# Patient Record
Sex: Female | Born: 1950 | Race: White | Hispanic: No | Marital: Married | Smoking: Former smoker
Health system: Southern US, Community
[De-identification: ages and names within clinical notes are randomized; demographics above are authoritative.]

## PROBLEM LIST (undated history)

## (undated) ENCOUNTER — Emergency Department (HOSPITAL_COMMUNITY): Payer: Medicare Other | Source: Home / Self Care

## (undated) DIAGNOSIS — Z9989 Dependence on other enabling machines and devices: Secondary | ICD-10-CM

## (undated) DIAGNOSIS — R0602 Shortness of breath: Secondary | ICD-10-CM

## (undated) DIAGNOSIS — E785 Hyperlipidemia, unspecified: Secondary | ICD-10-CM

## (undated) DIAGNOSIS — F419 Anxiety disorder, unspecified: Secondary | ICD-10-CM

## (undated) DIAGNOSIS — B029 Zoster without complications: Secondary | ICD-10-CM

## (undated) DIAGNOSIS — Z96649 Presence of unspecified artificial hip joint: Secondary | ICD-10-CM

## (undated) DIAGNOSIS — D649 Anemia, unspecified: Secondary | ICD-10-CM

## (undated) DIAGNOSIS — Z9289 Personal history of other medical treatment: Secondary | ICD-10-CM

## (undated) DIAGNOSIS — M009 Pyogenic arthritis, unspecified: Secondary | ICD-10-CM

## (undated) DIAGNOSIS — H269 Unspecified cataract: Secondary | ICD-10-CM

## (undated) DIAGNOSIS — F418 Other specified anxiety disorders: Secondary | ICD-10-CM

## (undated) DIAGNOSIS — G2581 Restless legs syndrome: Secondary | ICD-10-CM

## (undated) DIAGNOSIS — I700 Atherosclerosis of aorta: Secondary | ICD-10-CM

## (undated) DIAGNOSIS — J189 Pneumonia, unspecified organism: Secondary | ICD-10-CM

## (undated) DIAGNOSIS — I10 Essential (primary) hypertension: Secondary | ICD-10-CM

## (undated) DIAGNOSIS — J432 Centrilobular emphysema: Secondary | ICD-10-CM

## (undated) DIAGNOSIS — E039 Hypothyroidism, unspecified: Secondary | ICD-10-CM

## (undated) DIAGNOSIS — G4733 Obstructive sleep apnea (adult) (pediatric): Secondary | ICD-10-CM

## (undated) DIAGNOSIS — G5600 Carpal tunnel syndrome, unspecified upper limb: Secondary | ICD-10-CM

## (undated) DIAGNOSIS — I2510 Atherosclerotic heart disease of native coronary artery without angina pectoris: Secondary | ICD-10-CM

## (undated) DIAGNOSIS — Z8614 Personal history of Methicillin resistant Staphylococcus aureus infection: Secondary | ICD-10-CM

## (undated) DIAGNOSIS — K5903 Drug induced constipation: Secondary | ICD-10-CM

## (undated) DIAGNOSIS — M1990 Unspecified osteoarthritis, unspecified site: Secondary | ICD-10-CM

## (undated) DIAGNOSIS — K219 Gastro-esophageal reflux disease without esophagitis: Secondary | ICD-10-CM

## (undated) DIAGNOSIS — F319 Bipolar disorder, unspecified: Secondary | ICD-10-CM

## (undated) HISTORY — DX: Obstructive sleep apnea (adult) (pediatric): G47.33

## (undated) HISTORY — PX: NOSE SURGERY: SHX723

## (undated) HISTORY — PX: EYE SURGERY: SHX253

## (undated) HISTORY — DX: Pyogenic arthritis, unspecified: M00.9

## (undated) HISTORY — DX: Other specified anxiety disorders: F41.8

## (undated) HISTORY — DX: Presence of unspecified artificial hip joint: Z96.649

## (undated) HISTORY — DX: Bipolar disorder, unspecified: F31.9

## (undated) HISTORY — DX: Atherosclerosis of aorta: I70.0

## (undated) HISTORY — DX: Atherosclerotic heart disease of native coronary artery without angina pectoris: I25.10

## (undated) HISTORY — DX: Centrilobular emphysema: J43.2

## (undated) HISTORY — DX: Zoster without complications: B02.9

## (undated) HISTORY — DX: Personal history of Methicillin resistant Staphylococcus aureus infection: Z86.14

## (undated) HISTORY — PX: NECK SURGERY: SHX720

## (undated) HISTORY — DX: Unspecified cataract: H26.9

## (undated) HISTORY — DX: Anemia, unspecified: D64.9

## (undated) HISTORY — PX: BREAST CYST ASPIRATION: SHX578

## (undated) HISTORY — DX: Unspecified osteoarthritis, unspecified site: M19.90

## (undated) HISTORY — DX: Gastro-esophageal reflux disease without esophagitis: K21.9

---

## 1980-09-29 HISTORY — PX: TMJ ARTHROPLASTY: SHX1066

## 1980-09-29 HISTORY — PX: FOOT SURGERY: SHX648

## 1982-09-29 HISTORY — PX: PARTIAL HYSTERECTOMY: SHX80

## 2000-09-29 HISTORY — PX: TOTAL HIP ARTHROPLASTY: SHX124

## 2001-12-25 ENCOUNTER — Emergency Department (HOSPITAL_COMMUNITY): Admission: EM | Admit: 2001-12-25 | Discharge: 2001-12-25 | Payer: Self-pay | Admitting: *Deleted

## 2003-06-27 ENCOUNTER — Encounter: Payer: Self-pay | Admitting: Family Medicine

## 2006-01-22 ENCOUNTER — Encounter: Payer: Self-pay | Admitting: Family Medicine

## 2006-02-14 ENCOUNTER — Encounter: Payer: Self-pay | Admitting: Family Medicine

## 2006-11-10 ENCOUNTER — Encounter: Payer: Self-pay | Admitting: Family Medicine

## 2007-07-30 ENCOUNTER — Ambulatory Visit: Payer: Self-pay | Admitting: Family Medicine

## 2007-07-30 DIAGNOSIS — M1990 Unspecified osteoarthritis, unspecified site: Secondary | ICD-10-CM | POA: Insufficient documentation

## 2007-07-30 DIAGNOSIS — F3181 Bipolar II disorder: Secondary | ICD-10-CM | POA: Insufficient documentation

## 2007-07-30 DIAGNOSIS — K219 Gastro-esophageal reflux disease without esophagitis: Secondary | ICD-10-CM | POA: Insufficient documentation

## 2007-07-30 DIAGNOSIS — J309 Allergic rhinitis, unspecified: Secondary | ICD-10-CM | POA: Insufficient documentation

## 2007-07-30 DIAGNOSIS — J45909 Unspecified asthma, uncomplicated: Secondary | ICD-10-CM | POA: Insufficient documentation

## 2007-07-30 DIAGNOSIS — G4733 Obstructive sleep apnea (adult) (pediatric): Secondary | ICD-10-CM | POA: Insufficient documentation

## 2007-07-30 DIAGNOSIS — E1142 Type 2 diabetes mellitus with diabetic polyneuropathy: Secondary | ICD-10-CM | POA: Insufficient documentation

## 2007-07-30 DIAGNOSIS — Z9989 Dependence on other enabling machines and devices: Secondary | ICD-10-CM

## 2007-07-30 DIAGNOSIS — D649 Anemia, unspecified: Secondary | ICD-10-CM | POA: Insufficient documentation

## 2007-07-30 DIAGNOSIS — F319 Bipolar disorder, unspecified: Secondary | ICD-10-CM | POA: Insufficient documentation

## 2007-07-30 DIAGNOSIS — E039 Hypothyroidism, unspecified: Secondary | ICD-10-CM | POA: Insufficient documentation

## 2007-08-02 ENCOUNTER — Telehealth: Payer: Self-pay | Admitting: Family Medicine

## 2007-08-04 ENCOUNTER — Ambulatory Visit: Payer: Self-pay | Admitting: Family Medicine

## 2007-08-06 LAB — CONVERTED CEMR LAB
ALT: 33 units/L (ref 0–35)
AST: 21 units/L (ref 0–37)
Albumin: 3.9 g/dL (ref 3.5–5.2)
Alkaline Phosphatase: 83 units/L (ref 39–117)
BUN: 12 mg/dL (ref 6–23)
Bilirubin, Direct: 0.1 mg/dL (ref 0.0–0.3)
CO2: 30 meq/L (ref 19–32)
Calcium: 9.5 mg/dL (ref 8.4–10.5)
Chloride: 106 meq/L (ref 96–112)
Cholesterol: 171 mg/dL (ref 0–200)
Creatinine, Ser: 0.7 mg/dL (ref 0.4–1.2)
Creatinine,U: 210.7 mg/dL
GFR calc Af Amer: 112 mL/min
GFR calc non Af Amer: 92 mL/min
Glucose, Bld: 151 mg/dL — ABNORMAL HIGH (ref 70–99)
HDL: 33 mg/dL — ABNORMAL LOW (ref >39.0)
Hgb A1c MFr Bld: 8.1 % — ABNORMAL HIGH (ref 4.6–6.0)
LDL Cholesterol: 116 mg/dL — ABNORMAL HIGH (ref 0–99)
Microalb Creat Ratio: 3.3 mg/g (ref 0.0–30.0)
Microalb, Ur: 0.7 mg/dL (ref 0.0–1.9)
Potassium: 4.4 meq/L (ref 3.5–5.1)
Sodium: 143 meq/L (ref 135–145)
Total Bilirubin: 0.6 mg/dL (ref 0.3–1.2)
Total CHOL/HDL Ratio: 5.2
Total Protein: 6.5 g/dL (ref 6.0–8.3)
Triglycerides: 111 mg/dL (ref 0–149)
VLDL: 22 mg/dL (ref 0–40)

## 2007-08-16 ENCOUNTER — Ambulatory Visit: Payer: Self-pay | Admitting: Family Medicine

## 2007-08-16 DIAGNOSIS — E1169 Type 2 diabetes mellitus with other specified complication: Secondary | ICD-10-CM | POA: Insufficient documentation

## 2007-08-16 DIAGNOSIS — E7800 Pure hypercholesterolemia, unspecified: Secondary | ICD-10-CM

## 2007-08-20 ENCOUNTER — Telehealth: Payer: Self-pay | Admitting: Family Medicine

## 2007-09-10 ENCOUNTER — Ambulatory Visit: Payer: Self-pay | Admitting: Family Medicine

## 2007-09-15 ENCOUNTER — Ambulatory Visit: Payer: Self-pay | Admitting: Family Medicine

## 2007-10-21 ENCOUNTER — Ambulatory Visit: Payer: Self-pay | Admitting: Family Medicine

## 2007-12-06 ENCOUNTER — Encounter: Payer: Self-pay | Admitting: Family Medicine

## 2007-12-08 ENCOUNTER — Encounter: Payer: Self-pay | Admitting: Family Medicine

## 2007-12-10 ENCOUNTER — Ambulatory Visit: Payer: Self-pay | Admitting: Family Medicine

## 2007-12-13 LAB — CONVERTED CEMR LAB
BUN: 12 mg/dL (ref 6–23)
CO2: 28 meq/L (ref 19–32)
Calcium: 9.4 mg/dL (ref 8.4–10.5)
Chloride: 110 meq/L (ref 96–112)
Cholesterol: 118 mg/dL (ref 0–200)
Creatinine, Ser: 0.6 mg/dL (ref 0.4–1.2)
GFR calc Af Amer: 133 mL/min
GFR calc non Af Amer: 110 mL/min
Glucose, Bld: 108 mg/dL — ABNORMAL HIGH (ref 70–99)
HDL: 38.9 mg/dL — ABNORMAL LOW (ref >39.0)
Hgb A1c MFr Bld: 6.4 % — ABNORMAL HIGH (ref 4.6–6.0)
LDL Cholesterol: 65 mg/dL (ref 0–99)
Potassium: 4.3 meq/L (ref 3.5–5.1)
Sodium: 143 meq/L (ref 135–145)
Total CHOL/HDL Ratio: 3
Triglycerides: 71 mg/dL (ref 0–149)
VLDL: 14 mg/dL (ref 0–40)

## 2007-12-14 ENCOUNTER — Telehealth: Payer: Self-pay | Admitting: Family Medicine

## 2007-12-14 ENCOUNTER — Ambulatory Visit: Payer: Self-pay | Admitting: Family Medicine

## 2007-12-27 ENCOUNTER — Encounter: Payer: Self-pay | Admitting: Family Medicine

## 2008-01-11 ENCOUNTER — Encounter: Payer: Self-pay | Admitting: Family Medicine

## 2008-01-25 ENCOUNTER — Ambulatory Visit: Payer: Self-pay | Admitting: Family Medicine

## 2008-02-03 ENCOUNTER — Encounter: Payer: Self-pay | Admitting: Family Medicine

## 2008-03-14 ENCOUNTER — Ambulatory Visit: Payer: Self-pay | Admitting: Family Medicine

## 2008-03-15 LAB — CONVERTED CEMR LAB: Hgb A1c MFr Bld: 6.2 % — ABNORMAL HIGH (ref 4.6–6.0)

## 2008-03-16 ENCOUNTER — Ambulatory Visit: Payer: Self-pay | Admitting: Family Medicine

## 2008-03-21 ENCOUNTER — Ambulatory Visit: Payer: Self-pay | Admitting: Gastroenterology

## 2008-03-21 ENCOUNTER — Encounter: Payer: Self-pay | Admitting: Family Medicine

## 2008-03-21 LAB — HM COLONOSCOPY

## 2008-03-27 ENCOUNTER — Telehealth: Payer: Self-pay | Admitting: Family Medicine

## 2008-04-14 ENCOUNTER — Telehealth: Payer: Self-pay | Admitting: Family Medicine

## 2008-05-23 ENCOUNTER — Telehealth: Payer: Self-pay | Admitting: Family Medicine

## 2008-06-15 ENCOUNTER — Ambulatory Visit: Payer: Self-pay | Admitting: Family Medicine

## 2008-06-15 LAB — CONVERTED CEMR LAB
Creatinine,U: 32.3 mg/dL
Free T4: 0.9 ng/dL (ref 0.6–1.6)
Hgb A1c MFr Bld: 6.2 % — ABNORMAL HIGH (ref 4.6–6.0)
Microalb Creat Ratio: 6.2 mg/g (ref 0.0–30.0)
Microalb, Ur: 0.2 mg/dL (ref 0.0–1.9)
T3, Free: 2.6 pg/mL (ref 2.3–4.2)
TSH: 0.12 u[IU]/mL — ABNORMAL LOW (ref 0.35–5.50)

## 2008-06-20 ENCOUNTER — Ambulatory Visit: Payer: Self-pay | Admitting: Family Medicine

## 2008-06-22 ENCOUNTER — Encounter: Payer: Self-pay | Admitting: Family Medicine

## 2008-07-21 ENCOUNTER — Ambulatory Visit: Payer: Self-pay | Admitting: Family Medicine

## 2008-07-21 DIAGNOSIS — H8110 Benign paroxysmal vertigo, unspecified ear: Secondary | ICD-10-CM | POA: Insufficient documentation

## 2008-07-21 LAB — CONVERTED CEMR LAB
Free T4: 0.9 ng/dL (ref 0.6–1.6)
T3, Free: 2.5 pg/mL (ref 2.3–4.2)
TSH: 0.54 microintl units/mL (ref 0.35–5.50)

## 2008-09-19 ENCOUNTER — Ambulatory Visit: Payer: Self-pay | Admitting: Family Medicine

## 2008-09-27 ENCOUNTER — Ambulatory Visit: Payer: Self-pay | Admitting: Family Medicine

## 2008-09-27 LAB — CONVERTED CEMR LAB
ALT: 21 units/L (ref 0–35)
AST: 16 units/L (ref 0–37)
Albumin: 3.7 g/dL (ref 3.5–5.2)
Alkaline Phosphatase: 66 units/L (ref 39–117)
BUN: 11 mg/dL (ref 6–23)
Bilirubin, Direct: 0.1 mg/dL (ref 0.0–0.3)
CO2: 29 meq/L (ref 19–32)
Calcium: 9.3 mg/dL (ref 8.4–10.5)
Chloride: 111 meq/L (ref 96–112)
Cholesterol: 132 mg/dL (ref 0–200)
Creatinine, Ser: 0.7 mg/dL (ref 0.4–1.2)
GFR calc Af Amer: 111 mL/min
GFR calc non Af Amer: 92 mL/min
Glucose, Bld: 127 mg/dL — ABNORMAL HIGH (ref 70–99)
HDL: 48.6 mg/dL (ref >39.0)
Hgb A1c MFr Bld: 6.3 % — ABNORMAL HIGH (ref 4.6–6.0)
LDL Cholesterol: 70 mg/dL (ref 0–99)
Potassium: 4.6 meq/L (ref 3.5–5.1)
Sodium: 143 meq/L (ref 135–145)
Total Bilirubin: 0.7 mg/dL (ref 0.3–1.2)
Total CHOL/HDL Ratio: 2.7
Total Protein: 6.3 g/dL (ref 6.0–8.3)
Triglycerides: 65 mg/dL (ref 0–149)
VLDL: 13 mg/dL (ref 0–40)

## 2008-10-03 ENCOUNTER — Ambulatory Visit: Payer: Self-pay | Admitting: Family Medicine

## 2008-10-03 LAB — CONVERTED CEMR LAB
Cholesterol, target level: 200 mg/dL
HDL goal, serum: 40 mg/dL
LDL Goal: 100 mg/dL

## 2008-11-01 ENCOUNTER — Emergency Department: Payer: Self-pay | Admitting: Emergency Medicine

## 2008-11-02 ENCOUNTER — Telehealth: Payer: Self-pay | Admitting: Family Medicine

## 2008-11-09 ENCOUNTER — Ambulatory Visit: Payer: Self-pay | Admitting: Family Medicine

## 2008-11-09 DIAGNOSIS — J45901 Unspecified asthma with (acute) exacerbation: Secondary | ICD-10-CM | POA: Insufficient documentation

## 2008-12-14 ENCOUNTER — Ambulatory Visit: Payer: Self-pay | Admitting: Family Medicine

## 2009-01-01 ENCOUNTER — Ambulatory Visit: Payer: Self-pay | Admitting: Family Medicine

## 2009-01-01 LAB — CONVERTED CEMR LAB: Hgb A1c MFr Bld: 6.4 % (ref 4.6–6.5)

## 2009-01-02 ENCOUNTER — Telehealth: Payer: Self-pay | Admitting: Family Medicine

## 2009-01-02 ENCOUNTER — Ambulatory Visit: Payer: Self-pay | Admitting: Family Medicine

## 2009-01-02 ENCOUNTER — Encounter (INDEPENDENT_AMBULATORY_CARE_PROVIDER_SITE_OTHER): Payer: Self-pay | Admitting: *Deleted

## 2009-02-15 ENCOUNTER — Ambulatory Visit: Payer: Self-pay | Admitting: Family Medicine

## 2009-02-15 LAB — CONVERTED CEMR LAB
Bacteria, UA: 0
Bilirubin Urine: NEGATIVE
Glucose, Urine, Semiquant: NEGATIVE
Ketones, urine, test strip: NEGATIVE
Nitrite: NEGATIVE
Specific Gravity, Urine: <1.005
Urobilinogen, UA: 0.2
pH: 7.5

## 2009-02-16 ENCOUNTER — Encounter: Payer: Self-pay | Admitting: Family Medicine

## 2009-02-27 ENCOUNTER — Telehealth: Payer: Self-pay | Admitting: Family Medicine

## 2009-03-16 ENCOUNTER — Ambulatory Visit: Payer: Self-pay | Admitting: Family Medicine

## 2009-04-05 ENCOUNTER — Ambulatory Visit: Payer: Self-pay | Admitting: Family Medicine

## 2009-04-05 LAB — CONVERTED CEMR LAB
ALT: 29 units/L (ref 0–35)
AST: 23 units/L (ref 0–37)
Albumin: 3.8 g/dL (ref 3.5–5.2)
Alkaline Phosphatase: 70 units/L (ref 39–117)
BUN: 17 mg/dL (ref 6–23)
Bilirubin, Direct: 0.1 mg/dL (ref 0.0–0.3)
CO2: 27 meq/L (ref 19–32)
Calcium: 9.2 mg/dL (ref 8.4–10.5)
Chloride: 110 meq/L (ref 96–112)
Cholesterol: 137 mg/dL (ref 0–200)
Creatinine, Ser: 0.6 mg/dL (ref 0.4–1.2)
GFR calc non Af Amer: 109.59 mL/min (ref >60)
Glucose, Bld: 118 mg/dL — ABNORMAL HIGH (ref 70–99)
HDL: 50.3 mg/dL (ref >39.00)
Hgb A1c MFr Bld: 6.2 % (ref 4.6–6.5)
LDL Cholesterol: 77 mg/dL (ref 0–99)
Potassium: 4.1 meq/L (ref 3.5–5.1)
Sodium: 144 meq/L (ref 135–145)
Total Bilirubin: 0.6 mg/dL (ref 0.3–1.2)
Total CHOL/HDL Ratio: 3
Total Protein: 6.5 g/dL (ref 6.0–8.3)
Triglycerides: 51 mg/dL (ref 0.0–149.0)
VLDL: 10.2 mg/dL (ref 0.0–40.0)

## 2009-04-10 ENCOUNTER — Ambulatory Visit: Payer: Self-pay | Admitting: Family Medicine

## 2009-07-03 ENCOUNTER — Ambulatory Visit: Payer: Self-pay | Admitting: Family Medicine

## 2009-07-03 LAB — CONVERTED CEMR LAB
Creatinine,U: 64 mg/dL
Hgb A1c MFr Bld: 6.2 % (ref 4.6–6.5)
Microalb Creat Ratio: 3.1 mg/g (ref 0.0–30.0)
Microalb, Ur: 0.2 mg/dL (ref 0.0–1.9)
TSH: 0.6 microintl units/mL (ref 0.35–5.50)

## 2009-07-19 ENCOUNTER — Ambulatory Visit: Payer: Self-pay | Admitting: Internal Medicine

## 2009-07-26 ENCOUNTER — Ambulatory Visit: Payer: Self-pay | Admitting: Family Medicine

## 2009-08-09 ENCOUNTER — Ambulatory Visit: Payer: Self-pay | Admitting: Family Medicine

## 2009-10-12 ENCOUNTER — Ambulatory Visit: Payer: Self-pay | Admitting: Family Medicine

## 2009-10-12 LAB — CONVERTED CEMR LAB
ALT: 26 units/L (ref 0–35)
AST: 22 units/L (ref 0–37)
Albumin: 4.6 g/dL (ref 3.5–5.2)
Alkaline Phosphatase: 68 units/L (ref 39–117)
BUN: 18 mg/dL (ref 6–23)
Bilirubin, Direct: 0.1 mg/dL (ref 0.0–0.3)
CO2: 17 meq/L — ABNORMAL LOW (ref 19–32)
Calcium: 10.3 mg/dL (ref 8.4–10.5)
Chloride: 109 meq/L (ref 96–112)
Cholesterol: 145 mg/dL (ref 0–200)
Creatinine, Ser: 0.54 mg/dL (ref 0.40–1.20)
Glucose, Bld: 139 mg/dL — ABNORMAL HIGH (ref 70–99)
HDL: 59 mg/dL (ref >39)
Hgb A1c MFr Bld: 6.6 % — ABNORMAL HIGH (ref 4.6–6.5)
Indirect Bilirubin: 0.3 mg/dL (ref 0.0–0.9)
LDL Cholesterol: 68 mg/dL (ref 0–99)
Potassium: 4.6 meq/L (ref 3.5–5.3)
Sodium: 146 meq/L — ABNORMAL HIGH (ref 135–145)
Total Bilirubin: 0.4 mg/dL (ref 0.3–1.2)
Total CHOL/HDL Ratio: 2.5
Total Protein: 7.4 g/dL (ref 6.0–8.3)
Triglycerides: 89 mg/dL (ref <150)
VLDL: 18 mg/dL (ref 0–40)

## 2009-10-17 ENCOUNTER — Ambulatory Visit: Payer: Self-pay | Admitting: Family Medicine

## 2009-10-17 LAB — CONVERTED CEMR LAB

## 2009-10-22 ENCOUNTER — Encounter: Payer: Self-pay | Admitting: Family Medicine

## 2009-10-22 LAB — HM MAMMOGRAPHY: HM Mammogram: NORMAL

## 2009-11-29 ENCOUNTER — Encounter: Payer: Self-pay | Admitting: Family Medicine

## 2009-11-30 ENCOUNTER — Telehealth: Payer: Self-pay | Admitting: Family Medicine

## 2009-12-05 ENCOUNTER — Encounter: Payer: Self-pay | Admitting: Family Medicine

## 2009-12-25 ENCOUNTER — Ambulatory Visit: Payer: Self-pay | Admitting: Family Medicine

## 2009-12-25 DIAGNOSIS — M5412 Radiculopathy, cervical region: Secondary | ICD-10-CM | POA: Insufficient documentation

## 2009-12-25 DIAGNOSIS — M25559 Pain in unspecified hip: Secondary | ICD-10-CM | POA: Insufficient documentation

## 2010-01-03 ENCOUNTER — Encounter: Payer: Self-pay | Admitting: Family Medicine

## 2010-01-07 ENCOUNTER — Ambulatory Visit: Payer: Self-pay | Admitting: Pain Medicine

## 2010-01-07 ENCOUNTER — Telehealth: Payer: Self-pay | Admitting: Family Medicine

## 2010-01-08 ENCOUNTER — Encounter: Payer: Self-pay | Admitting: Family Medicine

## 2010-01-14 ENCOUNTER — Encounter: Payer: Self-pay | Admitting: Family Medicine

## 2010-01-14 ENCOUNTER — Ambulatory Visit: Payer: Self-pay | Admitting: Pain Medicine

## 2010-01-21 ENCOUNTER — Encounter: Payer: Self-pay | Admitting: Family Medicine

## 2010-01-22 ENCOUNTER — Ambulatory Visit: Payer: Self-pay | Admitting: Family Medicine

## 2010-01-22 LAB — CONVERTED CEMR LAB
ALT: 29 units/L (ref 0–35)
AST: 18 units/L (ref 0–37)
Albumin: 4.2 g/dL (ref 3.5–5.2)
Alkaline Phosphatase: 71 units/L (ref 39–117)
BUN: 15 mg/dL (ref 6–23)
Bilirubin, Direct: 0.1 mg/dL (ref 0.0–0.3)
CO2: 28 meq/L (ref 19–32)
Calcium: 9.4 mg/dL (ref 8.4–10.5)
Chloride: 106 meq/L (ref 96–112)
Cholesterol: 132 mg/dL (ref 0–200)
Creatinine, Ser: 0.6 mg/dL (ref 0.4–1.2)
GFR calc non Af Amer: 109.28 mL/min (ref >60)
Glucose, Bld: 118 mg/dL — ABNORMAL HIGH (ref 70–99)
HDL: 60 mg/dL (ref >39.00)
Hgb A1c MFr Bld: 6.9 % — ABNORMAL HIGH (ref 4.6–6.5)
LDL Cholesterol: 64 mg/dL (ref 0–99)
Potassium: 4.9 meq/L (ref 3.5–5.1)
Sodium: 141 meq/L (ref 135–145)
Total Bilirubin: 0.5 mg/dL (ref 0.3–1.2)
Total CHOL/HDL Ratio: 2
Total Protein: 6.2 g/dL (ref 6.0–8.3)
Triglycerides: 40 mg/dL (ref 0.0–149.0)
VLDL: 8 mg/dL (ref 0.0–40.0)

## 2010-01-24 ENCOUNTER — Encounter: Payer: Self-pay | Admitting: Family Medicine

## 2010-01-27 ENCOUNTER — Encounter: Payer: Self-pay | Admitting: Family Medicine

## 2010-01-29 ENCOUNTER — Ambulatory Visit: Payer: Self-pay | Admitting: Family Medicine

## 2010-03-11 ENCOUNTER — Encounter: Payer: Self-pay | Admitting: Family Medicine

## 2010-03-15 ENCOUNTER — Ambulatory Visit: Payer: Self-pay | Admitting: Family Medicine

## 2010-03-18 ENCOUNTER — Ambulatory Visit: Payer: Self-pay | Admitting: Family Medicine

## 2010-04-22 ENCOUNTER — Ambulatory Visit: Payer: Self-pay | Admitting: Family Medicine

## 2010-04-26 LAB — CONVERTED CEMR LAB: Hgb A1c MFr Bld: 9.1 % — ABNORMAL HIGH (ref 4.6–6.5)

## 2010-05-17 ENCOUNTER — Ambulatory Visit: Payer: Self-pay | Admitting: Family Medicine

## 2010-05-17 DIAGNOSIS — I10 Essential (primary) hypertension: Secondary | ICD-10-CM | POA: Insufficient documentation

## 2010-05-17 LAB — CONVERTED CEMR LAB
Bilirubin Urine: NEGATIVE
Blood in Urine, dipstick: NEGATIVE
Glucose, Urine, Semiquant: >=1000
Ketones, urine, test strip: NEGATIVE
Nitrite: POSITIVE
RBC / HPF: 0
Specific Gravity, Urine: 1.02
Urobilinogen, UA: 0.2
WBC, UA: 0 cells/hpf
pH: 7.5

## 2010-05-18 ENCOUNTER — Encounter: Payer: Self-pay | Admitting: Family Medicine

## 2010-06-25 ENCOUNTER — Ambulatory Visit: Payer: Self-pay | Admitting: Family Medicine

## 2010-06-25 LAB — CONVERTED CEMR LAB
BUN: 14 mg/dL (ref 6–23)
CO2: 30 meq/L (ref 19–32)
Calcium: 9.6 mg/dL (ref 8.4–10.5)
Chloride: 103 meq/L (ref 96–112)
Creatinine, Ser: 0.7 mg/dL (ref 0.4–1.2)
GFR calc non Af Amer: 92.86 mL/min (ref >60)
Glucose, Bld: 108 mg/dL — ABNORMAL HIGH (ref 70–99)
Potassium: 4.3 meq/L (ref 3.5–5.1)
Sodium: 142 meq/L (ref 135–145)

## 2010-08-05 ENCOUNTER — Encounter: Payer: Self-pay | Admitting: Family Medicine

## 2010-08-05 LAB — HM DIABETES EYE EXAM: HM Diabetic Eye Exam: NORMAL

## 2010-08-19 ENCOUNTER — Ambulatory Visit: Payer: Self-pay | Admitting: Family Medicine

## 2010-08-21 LAB — CONVERTED CEMR LAB
ALT: 31 units/L (ref 0–35)
AST: 23 units/L (ref 0–37)
Albumin: 4.2 g/dL (ref 3.5–5.2)
Alkaline Phosphatase: 71 units/L (ref 39–117)
BUN: 15 mg/dL (ref 6–23)
Bilirubin, Direct: 0.1 mg/dL (ref 0.0–0.3)
CO2: 29 meq/L (ref 19–32)
Calcium: 9.3 mg/dL (ref 8.4–10.5)
Chloride: 102 meq/L (ref 96–112)
Cholesterol: 120 mg/dL (ref 0–200)
Creatinine, Ser: 0.6 mg/dL (ref 0.4–1.2)
Creatinine,U: 32.5 mg/dL
GFR calc non Af Amer: 113.41 mL/min (ref >60)
Glucose, Bld: 142 mg/dL — ABNORMAL HIGH (ref 70–99)
HDL: 50.5 mg/dL (ref >39.00)
Hgb A1c MFr Bld: 6.2 % (ref 4.6–6.5)
LDL Cholesterol: 59 mg/dL (ref 0–99)
Microalb Creat Ratio: 0.9 mg/g (ref 0.0–30.0)
Microalb, Ur: 0.3 mg/dL (ref 0.0–1.9)
Potassium: 4.3 meq/L (ref 3.5–5.1)
Sodium: 141 meq/L (ref 135–145)
Total Bilirubin: 0.4 mg/dL (ref 0.3–1.2)
Total CHOL/HDL Ratio: 2
Total Protein: 6.5 g/dL (ref 6.0–8.3)
Triglycerides: 52 mg/dL (ref 0.0–149.0)
VLDL: 10.4 mg/dL (ref 0.0–40.0)

## 2010-08-27 ENCOUNTER — Ambulatory Visit: Payer: Self-pay | Admitting: Family Medicine

## 2010-09-29 HISTORY — PX: CERVICAL FUSION: SHX112

## 2010-10-22 ENCOUNTER — Ambulatory Visit
Admission: RE | Admit: 2010-10-22 | Discharge: 2010-10-22 | Payer: Self-pay | Source: Home / Self Care | Attending: Family Medicine | Admitting: Family Medicine

## 2010-10-22 DIAGNOSIS — M6530 Trigger finger, unspecified finger: Secondary | ICD-10-CM | POA: Insufficient documentation

## 2010-10-23 ENCOUNTER — Encounter: Payer: Self-pay | Admitting: Family Medicine

## 2010-10-29 ENCOUNTER — Encounter: Payer: Self-pay | Admitting: Family Medicine

## 2010-10-29 ENCOUNTER — Ambulatory Visit: Payer: Self-pay | Admitting: Pain Medicine

## 2010-10-29 NOTE — Assessment & Plan Note (Signed)
Summary: ROA 30 MIN  CYD   Vital Signs:  Patient profile:   60 year old female Height:      63.5 inches Weight:      360.2 pounds BMI:     63.03 Temp:     97.9 degrees F oral Pulse rate:   88 / minute Pulse rhythm:   regular BP sitting:   110 / 70  (left arm) Cuff size:   large  Vitals Entered By: Benny Lennert CMA Duncan Dull) (Jan 29, 2010 11:57 AM)  History of Present Illness: Chief complaint follow up   left arm/shoulder pain...cervical radiculopathy... seen at pain center...s/p cervical spine steroid injection. 85 % improvement in pain.  DM, moderate control..trending up...has been on steroids recently. Has been under a lot of stress with daughters marriage situation...she is a stress eateer.  Poor portion size.  BP well controlled.    Lipid Management History:      Positive NCEP/ATP III risk factors include female age 16 years old or older, diabetes, and current tobacco user.  Negative NCEP/ATP III risk factors include HDL cholesterol greater than 60.        The patient expresses understanding of adjunctive measures for cholesterol lowering.  Adjunctive measures started by the patient include aerobic exercise, ASA, limit alcohol consumpton, and weight reduction.  She expresses no side effects from her lipid-lowering medication.  The patient denies any symptoms to suggest myopathy or liver disease.     Problems Prior to Update: 1)  Hip Pain  (ICD-719.45) 2)  Cervical Radiculopathy, Left  (ICD-723.4) 3)  Preventive Health Care  (ICD-V70.0) 4)  Routine Gynecological Examination  (ICD-V72.31) 5)  Asthma, With Acute Exacerbation  (ICD-493.92) 6)  Diabetic Peripheral Neuropathy  (ICD-250.60) 7)  Benign Positional Vertigo  (ICD-386.11) 8)  Urinary Incontinence, Mixed  (ICD-788.33) 9)  Screening For Mlig Neop, Breast, Nos  (ICD-V76.10) 10)  Hypercholesterolemia  (ICD-272.0) 11)  Family History of Cad Female 1st Degree Relative <50  (ICD-V17.3) 12)  Family History of Cad Female  1st Degree Relative <60  (ICD-V16.49) 13)  Allergic Rhinitis  (ICD-477.9) 14)  Sleep Apnea  (ICD-780.57) 15)  Gerd  (ICD-530.81) 16)  Hypothyroidism  (ICD-244.9) 17)  Bipolar Affective Disorder, Depressed  (ICD-296.50) 18)  Diabetes Mellitus, Type II  (ICD-250.00) 19)  Asthma  (ICD-493.90) 20)  Osteoarthritis  (ICD-715.90) 21)  Anemia-nos  (ICD-285.9)  Current Medications (verified): 1)  Adult Aspirin Ec Low Strength 81 Mg  Tbec (Aspirin) .... Take 1 Tablet By Mouth Once A Day 2)  Oxcarbazepine 300 Mg  Tabs (Oxcarbazepine) .... Take Tablet By Mouth in The Morning  and 2 Tablets By Mouth Each Evening 3)  Levothyroxine Sodium 200 Mcg  Tabs (Levothyroxine Sodium) .... Take 1 Tablet By Mouth Once A Day 4)  Endocet 7.5-325 Mg  Tabs (Oxycodone-Acetaminophen) .... Take 1 By Mouth Every 8 Hours As Needed 5)  Furosemide 80 Mg  Tabs (Furosemide) .... Take 1/2 Tab By Mouth Two Times A Day As Needed 6)  Albuterol 90 Mcg/act  Aers (Albuterol) .... 2 Puff Inh As Needed 7)  Simvastatin 40 Mg  Tabs (Simvastatin) .... Take 1 Tablet By Mouth Once A Day 8)  Metformin Hcl 500 Mg  Tb24 (Metformin Hcl) .... Extended Release Take 2 Tablet By Mouth Once A Day 9)  Advair Hfa 115-21 Mcg/act  Aero (Fluticasone-Salmeterol) .... 2 Puff Once Daily 10)  Fish Oil Concentrate 300 Mg  Caps (Omega-3 Fatty Acids) .... Take 1 Capsule By Mouth Once A Day  11)  Singulair 10 Mg  Tabs (Montelukast Sodium) .Marland Kitchen.. 1 Tab By Mouth Qhs 12)  Celebrex 200 Mg Caps (Celecoxib) .... Take 1 Capsule By Mouth Once A Day or Up To Twice A Day 13)  Lamotrigine 200 Mg Tabs (Lamotrigine) .... Once A Day 14)  Allegra 180 Mg Tabs (Fexofenadine Hcl) .Marland Kitchen.. 1 Tab By Mouth Daily 15)  Bayer Contour Test  Strp (Glucose Blood) .... Patient Tests Once Daily 16)  Tramadol Hcl 50 Mg Tabs (Tramadol Hcl) .Marland Kitchen.. 1 Tab By Mouth At Bedtime As Needed Breakthru Pain 17)  Morphine Sulfate 15 Mg Tabs (Morphine Sulfate) .... Take One Tablet Every 6 Hours As Needed For  Pain 18)  Percocet 10-325 Mg Tabs (Oxycodone-Acetaminophen) .Marland Kitchen.. 1-2 Tablets Every 4-6 Hours As Needed For Pain 19)  Black Cohosh Hot Flash Relief 40 Mg Caps (Black Cohosh) 20)  Cyclobenzaprine Hcl 10 Mg Tabs (Cyclobenzaprine Hcl) .Marland Kitchen.. 1 Tab By Mouth At Bedtime As Needed Muscle Spasm  Allergies: 1)  ! Sulfa  Past History:  Past medical, surgical, family and social histories (including risk factors) reviewed, and no changes noted (except as noted below).  Past Medical History: Reviewed history from 07/30/2007 and no changes required. Anemia-NOS Osteoarthritis Asthma, moderate pesistant Diabetes mellitus, type II Hypothyroidism GERD Allergic rhinitis  Past Surgical History: Reviewed history from 07/30/2007 and no changes required. L hip replacement 2002 hysterectomy, partial abdominal 1982 for mennorhagia, no cervix left foot surgery bone spur 1982 neck surgery, herniated disc C2,3,4 TMJ surgery 1982 hosp for bipolar 10 years ago  Family History: Reviewed history from 07/30/2007 and no changes required. father died age 40 aneyrsym, alcoholism mother died age 97 MVA 5 siblings: CAD, MI in 54s, late 26s Family History of CAD Female 1st degree relative <60 Family History of CAD Female 1st degree relative <50  Social History: Reviewed history from 07/30/2007 and no changes required. 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 CV:  Denies chest pain or discomfort. Resp:  Denies shortness of breath, sputum productive, and wheezing; occ wheeze whrn pollen count up. GI:  Denies abdominal pain. GU:  Denies dysuria.  Physical Exam  General:  morbidly obese female iN AND Nose:  External nasal examination shows no deformity or inflammation. Nasal mucosa are pink and moist without lesions or exudates. Mouth:   MMM Neck:  no carotid bruit or thyromegaly no cervical or supraclavicular lymphadenopathy  Lungs:  Normal respiratory effort, chest expands symmetrically. Lungs are clear to auscultation, no crackles or wheezes. Heart:  Normal rate and regular rhythm. S1 and S2 normal without gallop, murmur, click, rub or other extra sounds. Abdomen:  Bowel sounds positive,abdomen soft and non-tender without masses, organomegaly or hernias noted. Pulses:  R and L posterior tibial pulses are full and equal bilaterally  Extremities:  no edema  Diabetes Management Exam:    Foot Exam (with socks and/or shoes not present):       Sensory-Pinprick/Light touch:          Left medial foot (L-4): normal          Left dorsal foot (L-5): normal          Left lateral foot (S-1): normal          Right medial foot (L-4): normal          Right dorsal foot (L-5): normal  Right lateral foot (S-1): normal       Sensory-Monofilament:          Left foot: normal          Right foot: normal       Inspection:          Left foot: normal          Right foot: normal       Nails:          Left foot: normal          Right foot: normal   Impression & Recommendations:  Problem # 1:  CERVICAL RADICULOPATHY, LEFT (ICD-723.4) Improved with steroid injection.   Problem # 2:  DIABETES MELLITUS, TYPE II (ICD-250.00) Trending up A1C...get back on track with weight loss. Consider Byetta/victoza if not improving . Encouraged exercise, weight loss, healthy eating habits.  Her updated medication list for this problem includes:    Adult Aspirin Ec Low Strength 81 Mg Tbec (Aspirin) .Marland Kitchen... Take 1 tablet by mouth once a day    Metformin Hcl 500 Mg Tb24 (Metformin hcl) ..... Extended release take 2 tablet by mouth once a day  Problem # 3:  HYPERCHOLESTEROLEMIA (ICD-272.0) Well controlled. Continue current medication.  Her updated medication list for this problem includes:    Simvastatin 40 Mg Tabs (Simvastatin) .Marland Kitchen... Take 1 tablet  by mouth once a day  Labs Reviewed: SGOT: 18 (01/22/2010)   SGPT: 29 (01/22/2010)  Lipid Goals: Chol Goal: 200 (10/03/2008)   HDL Goal: 40 (10/03/2008)   LDL Goal: 100 (10/03/2008)   TG Goal: 150 (10/03/2008)  10 Yr Risk Heart Disease: 6 % Prior 10 Yr Risk Heart Disease: 13 % (10/17/2009)   HDL:60.00 (01/22/2010), 59 (10/12/2009)  LDL:64 (01/22/2010), 68 (10/12/2009)  Chol:132 (01/22/2010), 145 (10/12/2009)  Trig:40.0 (01/22/2010), 89 (10/12/2009)  Complete Medication List: 1)  Adult Aspirin Ec Low Strength 81 Mg Tbec (Aspirin) .... Take 1 tablet by mouth once a day 2)  Oxcarbazepine 300 Mg Tabs (Oxcarbazepine) .... Take tablet by mouth in the morning  and 2 tablets by mouth each evening 3)  Levothyroxine Sodium 200 Mcg Tabs (Levothyroxine sodium) .... Take 1 tablet by mouth once a day 4)  Endocet 7.5-325 Mg Tabs (Oxycodone-acetaminophen) .... Take 1 by mouth every 8 hours as needed 5)  Furosemide 80 Mg Tabs (Furosemide) .... Take 1/2 tab by mouth two times a day as needed 6)  Albuterol 90 Mcg/act Aers (Albuterol) .... 2 puff inh as needed 7)  Simvastatin 40 Mg Tabs (Simvastatin) .... Take 1 tablet by mouth once a day 8)  Metformin Hcl 500 Mg Tb24 (Metformin hcl) .... Extended release take 2 tablet by mouth once a day 9)  Advair Hfa 115-21 Mcg/act Aero (Fluticasone-salmeterol) .... 2 puff once daily 10)  Fish Oil Concentrate 300 Mg Caps (Omega-3 fatty acids) .... Take 1 capsule by mouth once a day 11)  Singulair 10 Mg Tabs (Montelukast sodium) .Marland Kitchen.. 1 tab by mouth qhs 12)  Celebrex 200 Mg Caps (Celecoxib) .... Take 1 capsule by mouth once a day or up to twice a day 13)  Lamotrigine 200 Mg Tabs (Lamotrigine) .... Once a day 14)  Allegra 180 Mg Tabs (Fexofenadine hcl) .Marland Kitchen.. 1 tab by mouth daily 15)  Bayer Contour Test Strp (Glucose blood) .... Patient tests once daily 16)  Tramadol Hcl 50 Mg Tabs (Tramadol hcl) .Marland Kitchen.. 1 tab by mouth at bedtime as needed breakthru pain 17)  Morphine Sulfate 15  Mg Tabs (Morphine sulfate) .Marland KitchenMarland KitchenMarland Kitchen  Take one tablet every 6 hours as needed for pain 18)  Percocet 10-325 Mg Tabs (Oxycodone-acetaminophen) .Marland Kitchen.. 1-2 tablets every 4-6 hours as needed for pain 19)  Black Cohosh Hot Flash Relief 40 Mg Caps (Black cohosh) 20)  Cyclobenzaprine Hcl 10 Mg Tabs (Cyclobenzaprine hcl) .Marland Kitchen.. 1 tab by mouth at bedtime as needed muscle spasm  Lipid Assessment/Plan:      Based on NCEP/ATP III, the patient's risk factor category is "history of diabetes".  The patient's lipid goals are as follows: Total cholesterol goal is 200; LDL cholesterol goal is 100; HDL cholesterol goal is 40; Triglyceride goal is 150.  Her LDL cholesterol goal has been met.    Patient Instructions: 1)  Please schedule a follow-up appointment in 3 months .  2)  HgBA1c prior to visit  ICD-9: 250.00  Current Allergies (reviewed today): ! SULFA

## 2010-10-29 NOTE — Progress Notes (Signed)
Summary: refil request for celebrex  Phone Note Refill Request Message from:  Fax from Pharmacy  Refills Requested: Medication #1:  CELEBREX 200 MG CAPS Take 1 capsule by mouth once a day or up to twice a day [facility] [place] [place]. Form is on your desk.  Initial call taken by: Lowella Petties CMA,  November 30, 2009 11:18 AM    Prescriptions: CELEBREX 200 MG CAPS (CELECOXIB) Take 1 capsule by mouth once a day or up to twice a day  #60 x 3   Entered and Authorized by:   [name] MD   Signed by:   [name] MD on 11/30/2009   Method used:   Handwritten   RxID:   [id number]

## 2010-10-29 NOTE — Letter (Signed)
Summary: Surgical Clearance/[facility]  [facility]   Imported By: Lanelle Bal 01/10/2010 11:57:12  _____________________________________________________________________  External Attachment:    Type:   Image     Comment:   External Document

## 2010-10-29 NOTE — Assessment & Plan Note (Signed)
Summary: not feeling any better   Vital Signs:  Patient profile:   60 year old female Height:      63.5 inches Weight:      358.38 pounds BMI:     62.71 Temp:     97.8 degrees F oral Pulse rate:   68 / minute Pulse rhythm:   regular BP sitting:   150 / 90  (right arm) Cuff size:   large  Vitals Entered By: Linde Gillis CMA Duncan Dull) (March 18, 2010 9:44 AM) CC: not feeling any better, cough   History of Present Illness: 60 yo here for URI symptoms not getting any better.  Started having URI symptoms, including runny nose, ear pressure, wheezing and productive cough last Weds.  Went to urgent care on Monday, given nebulizer treatment, Ventolin inhaler and Zpack pack. Finished Zpack 2 days ago. Using the ventolin inhaler every 4-6 hours. Still wheezing and cough keeping her up at night. No fevers, chills, CP or other symptoms. Given IM decadron in office on Friday along with 5 day course of prednisone. States some symptoms better but still having worsening productive cough and wheezing. No longer febrile, no lonter having a runny nose.  Does have h/o asthma and chronic bronchitis.  Has not been taking her Advair because she ran out.  Current Medications (verified): 1)  Adult Aspirin Ec Low Strength 81 Mg  Tbec (Aspirin) .... Take 1 Tablet By Mouth Once A Day 2)  Oxcarbazepine 300 Mg  Tabs (Oxcarbazepine) .... Take Tablet By Mouth in The Morning  and 2 Tablets By Mouth Each Evening 3)  Levothyroxine Sodium 200 Mcg  Tabs (Levothyroxine Sodium) .... Take 1 Tablet By Mouth Once A Day 4)  Endocet 7.5-325 Mg  Tabs (Oxycodone-Acetaminophen) .... Take 1 By Mouth Every 8 Hours As Needed 5)  Furosemide 80 Mg  Tabs (Furosemide) .... Take 1/2 Tab By Mouth Two Times A Day As Needed 6)  Albuterol 90 Mcg/act  Aers (Albuterol) .... 2 Puff Inh As Needed 7)  Simvastatin 40 Mg  Tabs (Simvastatin) .... Take 1 Tablet By Mouth Once A Day 8)  Metformin Hcl 500 Mg  Tb24 (Metformin Hcl) .... Extended  Release Take 2 Tablet By Mouth Once A Day 9)  Advair Hfa 115-21 Mcg/act  Aero (Fluticasone-Salmeterol) .... 2 Puff Once Daily 10)  Fish Oil Concentrate 300 Mg  Caps (Omega-3 Fatty Acids) .... Take 1 Capsule By Mouth Once A Day 11)  Singulair 10 Mg  Tabs (Montelukast Sodium) .Marland Kitchen.. 1 Tab By Mouth Qhs 12)  Celebrex 200 Mg Caps (Celecoxib) .... Take 1 Capsule By Mouth Once A Day or Up To Twice A Day 13)  Lamotrigine 200 Mg Tabs (Lamotrigine) .... Once A Day 14)  Allegra 180 Mg Tabs (Fexofenadine Hcl) .Marland Kitchen.. 1 Tab By Mouth Daily As Needed 15)  Bayer Contour Test  Strp (Glucose Blood) .... Patient Tests Once Daily 16)  Tramadol Hcl 50 Mg Tabs (Tramadol Hcl) .Marland Kitchen.. 1 Tab By Mouth At Bedtime As Needed Breakthru Pain 17)  Morphine Sulfate 15 Mg Tabs (Morphine Sulfate) .... Take One Tablet Every 6 Hours As Needed For Pain 18)  Percocet 10-325 Mg Tabs (Oxycodone-Acetaminophen) .Marland Kitchen.. 1-2 Tablets Every 4-6 Hours As Needed For Pain 19)  Black Cohosh Hot Flash Relief 40 Mg Caps (Black Cohosh) .... As Needed 20)  Cyclobenzaprine Hcl 10 Mg Tabs (Cyclobenzaprine Hcl) .Marland Kitchen.. 1 Tab By Mouth At Bedtime As Needed Muscle Spasm 21)  Mucinex 600 Mg Xr12h-Tab (Guaifenesin) .... Otc As Directed. 22)  Zithromax Z-Pak 250 Mg Tabs (Azithromycin) .... Take As Directed 23)  Prednisone 20 Mg Tabs (Prednisone) .... 3 Tab By Mouth X 2 Days, 2 Tabs By Mouth X 2 Days, Then 1 Tab By Mouth X 1 Day 24)  Cheratussin Ac 100-10 Mg/12ml Syrp (Guaifenesin-Codeine) .... 5 Ml At Bedtime As Needed Cough 25)  Avelox 400 Mg  Tabs (Moxifloxacin Hcl) .Marland Kitchen.. 1 Tablet By Mouth Daily X 5 Days  Allergies: 1)  ! Sulfa  Past History:  Past Medical History: Last updated: 09-Aug-2007 Anemia-NOS Osteoarthritis Asthma, moderate pesistant Diabetes mellitus, type II Hypothyroidism GERD Allergic rhinitis  Past Surgical History: Last updated: 2007-08-09 L hip replacement 2002 hysterectomy, partial abdominal 1982 for mennorhagia, no cervix left foot  surgery bone spur 1982 neck surgery, herniated disc C2,3,4 TMJ surgery 1982 hosp for bipolar 10 years ago  Family History: Last updated: 08-09-07 father died age 64 aneyrsym, alcoholism mother died age 51 MVA 5 siblings: CAD, MI in 78s, late 58s Family History of CAD Female 1st degree relative <60 Family History of CAD Female 1st degree relative <50  Social History: Last updated: 08-09-2007 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 ENT:  Denies difficulty swallowing, postnasal drainage, sinus pressure, and sore throat. CV:  Denies chest pain or discomfort. Resp:  Complains of cough, shortness of breath, sputum productive, and wheezing.  Physical Exam  General:  morbidly obese female iN AND Mouth:  MMM Lungs:  Normal respiratory effort, chest expands symmetrically.Still has some scattered exp wheezes at bases, no increased WOB. Heart:  Normal rate and regular rhythm. S1 and S2 normal without gallop, murmur, click, rub or other extra sounds. Extremities:  no edema Psych:  Cognition and judgment appear intact. Alert and cooperative with normal attention span and concentration. No apparent delusions, illusions, hallucinations   Impression & Recommendations:  Problem # 1:  ASTHMA, WITH ACUTE EXACERBATION (ICD-493.92) Assessment Unchanged Per pt some symptoms worse. Lung exam mildly improved. Advised taking her Advair (refilled it) and given prescription for 5 day course of Avelox given her PMH. Finish dose pack of prednisone as directed. The following medications were removed from the medication list:    Ventolin Hfa 108 (90 Base) Mcg/act Aers (Albuterol sulfate) .Marland Kitchen..Marland Kitchen Two puffs every 4-6 hours as  needed Her updated medication list for this problem includes:    Albuterol 90 Mcg/act Aers (Albuterol) .Marland Kitchen... 2 puff inh as needed    Advair Hfa 115-21 Mcg/act Aero (Fluticasone-salmeterol) .Marland Kitchen... 2 puff once daily    Singulair 10 Mg Tabs (Montelukast sodium) .Marland Kitchen... 1 tab by mouth qhs    Prednisone 20 Mg Tabs (Prednisone) .Marland KitchenMarland KitchenMarland KitchenMarland Kitchen 3 tab by mouth x 2 days, 2 tabs by mouth x 2 days, then 1 tab by mouth x 1 day  Complete Medication List: 1)  Adult Aspirin Ec Low Strength 81 Mg Tbec (Aspirin) .... Take 1 tablet by mouth once a day 2)  Oxcarbazepine 300 Mg Tabs (Oxcarbazepine) .... Take tablet by mouth in the morning  and 2 tablets by mouth each evening 3)  Levothyroxine Sodium 200 Mcg Tabs (Levothyroxine sodium) .... Take 1 tablet by mouth once a day 4)  Endocet 7.5-325 Mg Tabs (Oxycodone-acetaminophen) .... Take 1 by mouth every 8 hours as needed 5)  Furosemide 80 Mg Tabs (  Furosemide) .... Take 1/2 tab by mouth two times a day as needed 6)  Albuterol 90 Mcg/act Aers (Albuterol) .... 2 puff inh as needed 7)  Simvastatin 40 Mg Tabs (Simvastatin) .... Take 1 tablet by mouth once a day 8)  Metformin Hcl 500 Mg Tb24 (Metformin hcl) .... Extended release take 2 tablet by mouth once a day 9)  Advair Hfa 115-21 Mcg/act Aero (Fluticasone-salmeterol) .... 2 puff once daily 10)  Fish Oil Concentrate 300 Mg Caps (Omega-3 fatty acids) .... Take 1 capsule by mouth once a day 11)  Singulair 10 Mg Tabs (Montelukast sodium) .Marland Kitchen.. 1 tab by mouth qhs 12)  Celebrex 200 Mg Caps (Celecoxib) .... Take 1 capsule by mouth once a day or up to twice a day 13)  Lamotrigine 200 Mg Tabs (Lamotrigine) .... Once a day 14)  Allegra 180 Mg Tabs (Fexofenadine hcl) .Marland Kitchen.. 1 tab by mouth daily as needed 15)  Bayer Contour Test Strp (Glucose blood) .... Patient tests once daily 16)  Tramadol Hcl 50 Mg Tabs (Tramadol hcl) .Marland Kitchen.. 1 tab by mouth at bedtime as needed breakthru pain 17)  Morphine Sulfate 15 Mg Tabs (Morphine sulfate) .... Take one  tablet every 6 hours as needed for pain 18)  Percocet 10-325 Mg Tabs (Oxycodone-acetaminophen) .Marland Kitchen.. 1-2 tablets every 4-6 hours as needed for pain 19)  Black Cohosh Hot Flash Relief 40 Mg Caps (Black cohosh) .... As needed 20)  Cyclobenzaprine Hcl 10 Mg Tabs (Cyclobenzaprine hcl) .Marland Kitchen.. 1 tab by mouth at bedtime as needed muscle spasm 21)  Mucinex 600 Mg Xr12h-tab (Guaifenesin) .... Otc as directed. 22)  Zithromax Z-pak 250 Mg Tabs (Azithromycin) .... Take as directed 23)  Prednisone 20 Mg Tabs (Prednisone) .... 3 tab by mouth x 2 days, 2 tabs by mouth x 2 days, then 1 tab by mouth x 1 day 24)  Cheratussin Ac 100-10 Mg/88ml Syrp (Guaifenesin-codeine) .... 5 ml at bedtime as needed cough 25)  Avelox 400 Mg Tabs (Moxifloxacin hcl) .Marland Kitchen.. 1 tablet by mouth daily x 5 days Prescriptions: AVELOX 400 MG  TABS (MOXIFLOXACIN HCL) 1 tablet by mouth daily x 5 days  #5 x 0   Entered and Authorized by:   Ruthe Mannan MD   Signed by:   Ruthe Mannan MD on 03/18/2010   Method used:   [facility]        [facility]  [place] (retail[facility]       [address]       [place], [place]  [address]       Ph: [phone] or [phone]       Fax: [phone]   RxID:   [id number] ADVAIR HFA 115-21 MCG/ACT  AERO (FLUTICASONE-SALMETEROL) 2 puff once daily  #1 x 3   Entered and Authorized by:   Ruthe Mannan MD   Signed by:   Ruthe Mannan MD on 03/18/2010   Method used:   [facility]        [facility]  [place] (retail[facility]       [address]       [place], [place]  [address]       Ph: [phone] or [phone]       Fax: [phone]   RxID:   [id number]   Current Allergies (reviewed today): ! SULFA

## 2010-10-29 NOTE — Letter (Signed)
Summary: [facility]  [facility]   Imported By: Lanelle Bal 01/22/2010 09:54:12  _____________________________________________________________________  External Attachment:    Type:   Image     Comment:   External Document

## 2010-10-29 NOTE — Letter (Signed)
Summary: [facility]  [facility]   Imported By: Sherian Rein 09/03/2010 11:45:05  _____________________________________________________________________  External Attachment:    Type:   Image     Comment:   External Document  Appended Document: Orders Update    Clinical Lists Changes  Observations: Added new observation of EYES COMMENT: 07/2011 (09/03/2010 13:47) Added new observation of DMEYEEXMRES: normal (08/05/2010 13:47) Added new observation of DIAB EYE EX: normal (08/05/2010 13:47)       Diabetes Management History:      She is (or has been) enrolled in the "Diabetic Education Program".  She is checking home blood sugars.  She says that she is exercising.  Type of exercise includes: walking.  She is doing this 1 time per week.    Diabetes Management Exam:    Eye Exam:       Eye Exam done elsewhere          Date: 08/05/2010          Results: normal          Done by: eye MD  Diabetes Management Assessment/Plan:      The following lipid goals have been established for the patient: Total cholesterol goal of 200; LDL cholesterol goal of 100; HDL cholesterol goal of 40; Triglyceride goal of 150.  Her blood pressure goal is < 130/80.

## 2010-10-29 NOTE — Progress Notes (Signed)
Summary: Surgical Clearance  Phone Note Call from Patient   Caller: Bonita Quin [phone] Call For: [name] MD Summary of Call: needs clearence for steriod injection and also to hold aspirin for 5 days  Initial call taken by: Benny Lennert CMA Duncan Dull),  January 07, 2010 1:58 PM  Follow-up for Phone Call        It is not clear to me who is doing this procedure or what procedure we are talking about? Epidural vs. facet injection? Follow-up by: Hannah Beat MD,  January 07, 2010 4:43 PM  Additional Follow-up for Phone Call Additional follow up Details #1::        Form completed. pt cleared.  Additional Follow-up by: [name] MD,  January 08, 2010 9:07 AM    Additional Follow-up for Phone Call Additional follow up Details #2::    Form faxed to [facility] at [phone] Follow-up by: Linde Gillis CMA Duncan Dull),  January 08, 2010 9:12 AM

## 2010-10-29 NOTE — Assessment & Plan Note (Signed)
Summary: CPX   CYD   Vital Signs:  Patient profile:   60 year old female Height:      63.5 inches Weight:      344.4 pounds BMI:     60.27 Temp:     97.8 degrees F oral Pulse rate:   80 / minute Pulse rhythm:   regular BP sitting:   130 / 78  (left arm) Cuff size:   large  Vitals Entered By: Benny Lennert CMA Duncan Dull) (October 17, 2009 2:08 PM)  History of Present Illness: Chief complaint cpx  Under alot of stress in last few months.  In last week or 2 ago noted black spot on back , no redness, n tenderness, no discharge. No family or personal  history of melanoma  DM, well controlled   High chol, well controlled  Low thyroid: stable 06/2009   QUIT smoking 06/2009.  Has been eating more though..20 lb weight gain.   Lipid Management History:      Positive NCEP/ATP III risk factors include female age 67 years old or older, diabetes, and current tobacco user.        The patient expresses understanding of adjunctive measures for cholesterol lowering.  Adjunctive measures started by the patient include aerobic exercise and weight reduction.  She expresses no side effects from her lipid-lowering medication.  The patient denies any symptoms to suggest myopathy or liver disease.     Problems Prior to Update: 1)  Wrist Sprain, Left  (ICD-842.00) 2)  Ingrown Toenail  (ICD-703.0) 3)  Uti  (ICD-599.0) 4)  Asthma, With Acute Exacerbation  (ICD-493.92) 5)  Diabetic Peripheral Neuropathy  (ICD-250.60) 6)  Benign Positional Vertigo  (ICD-386.11) 7)  Urinary Incontinence, Mixed  (ICD-788.33) 8)  Foot Pain, Left  (ICD-729.5) 9)  Screening For Mlig Neop, Breast, Nos  (ICD-V76.10) 10)  Sinusitis- Acute-nos  (ICD-461.9) 11)  Hypercholesterolemia  (ICD-272.0) 12)  Intertrigo, Candidal  (ICD-695.89) 13)  Family History of Cad Female 1st Degree Relative <50  (ICD-V17.3) 14)  Family History of Cad Female 1st Degree Relative <60  (ICD-V16.49) 15)  Allergic Rhinitis  (ICD-477.9) 16)  Sleep  Apnea  (ICD-780.57) 17)  Gerd  (ICD-530.81) 18)  Hypothyroidism  (ICD-244.9) 19)  Bipolar Affective Disorder, Depressed  (ICD-296.50) 20)  Diabetes Mellitus, Type II  (ICD-250.00) 21)  Asthma  (ICD-493.90) 22)  Osteoarthritis  (ICD-715.90) 23)  Anemia-nos  (ICD-285.9)  Current Medications (verified): 1)  Adult Aspirin Ec Low Strength 81 Mg  Tbec (Aspirin) .... Take 1 Tablet By Mouth Once A Day 2)  Oxcarbazepine 300 Mg  Tabs (Oxcarbazepine) .... Take Tablet By Mouth in The Morning  and 2 Tablets By Mouth Each Evening 3)  Levothyroxine Sodium 200 Mcg  Tabs (Levothyroxine Sodium) .... Take 1 Tablet By Mouth Once A Day 4)  Endocet 7.5-325 Mg  Tabs (Oxycodone-Acetaminophen) .... Take 1 By Mouth Every 8 Hours As Needed 5)  Furosemide 80 Mg  Tabs (Furosemide) .... Take 1/2 Tab By Mouth Two Times A Day As Needed 6)  Albuterol 90 Mcg/act  Aers (Albuterol) .... 2 Puff Inh As Needed 7)  Simvastatin 40 Mg  Tabs (Simvastatin) .... Take 1 Tablet By Mouth Once A Day 8)  Metformin Hcl 500 Mg  Tb24 (Metformin Hcl) .... Extended Release Take 2 Tablet By Mouth Once A Day 9)  Advair Hfa 115-21 Mcg/act  Aero (Fluticasone-Salmeterol) .... 2 Puff Once Daily 10)  Fish Oil Concentrate 300 Mg  Caps (Omega-3 Fatty Acids) .... Take 1 Capsule By  Mouth Once A Day 11)  Singulair 10 Mg  Tabs (Montelukast Sodium) .Marland Kitchen.. 1 Tab By Mouth Qhs 12)  Celebrex 200 Mg Caps (Celecoxib) .... Take 1 Capsule By Mouth Once A Day or Up To Twice A Day 13)  Lamotrigine 200 Mg Tabs (Lamotrigine) .... Once A Day 14)  Allegra 180 Mg Tabs (Fexofenadine Hcl) .Marland Kitchen.. 1 Tab By Mouth Daily 15)  Bayer Contour Test  Strp (Glucose Blood) .... Patient Tests Once Daily 16)  Tramadol Hcl 50 Mg Tabs (Tramadol Hcl) .Marland Kitchen.. 1 Tab By Mouth At Bedtime As Needed Breakthru Pain  Allergies: 1)  ! Sulfa  Past History:  Past medical, surgical, family and social histories (including risk factors) reviewed, and no changes noted (except as noted below).  Past  Medical History: Reviewed history from 07/30/2007 and no changes required. Anemia-NOS Osteoarthritis Asthma, moderate pesistant Diabetes mellitus, type II Hypothyroidism GERD Allergic rhinitis  Past Surgical History: Reviewed history from 07/30/2007 and no changes required. L hip replacement 2002 hysterectomy, partial abdominal 1982 for mennorhagia, no cervix left foot surgery bone spur 1982 neck surgery, herniated disc C2,3,4 TMJ surgery 1982 hosp for bipolar 10 years ago  Family History: Reviewed history from 07/30/2007 and no changes required. father died age 81 aneyrsym, alcoholism mother died age 80 MVA 5 siblings: CAD, MI in 26s, late 40s Family History of CAD Female 1st degree relative <60 Family History of CAD Female 1st degree relative <50  Social History: Reviewed history from 07/30/2007 and no changes required. 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 CV:  Denies chest pain or discomfort. Resp:  Denies shortness of breath, sputum productive, and wheezing. GI:  Complains of constipation; denies abdominal pain, bloody stools, and diarrhea. GU:  Denies abnormal vaginal bleeding and dysuria.  Physical Exam  General:  morbidly obese appearing female in NAD Ears:  clear fluid B TMS Nose:  External nasal examination shows no deformity or inflammation. Nasal mucosa are pink and moist without lesions or exudates. Mouth:  MMM Neck:  no carotid bruit or thyromegaly no cervical or supraclavicular lymphadenopathy  Chest Wall:  No deformities, masses, or tenderness noted. Breasts:  No mass, nodules, thickening, tenderness, bulging, retraction, inflamation, nipple discharge or skin changes noted.   Lungs:  Exp wheeze, no rhonchi Heart:  Normal rate and regular rhythm. S1 and S2 normal without gallop,  murmur, click, rub or other extra sounds. Abdomen:  Bowel sounds positive,abdomen soft and non-tender without masses, organomegaly or hernias noted. Genitalia:  normal introitus, no external lesions, no uterus, and no adnexal masses or tenderness.   Pulses:  R and L posterior tibial pulses are full and equal bilaterally  Extremities:  no edema Skin:  Healing subcutaneous cyst right lower back, no suspicious reasons. Psych:  Cognition and judgment appear intact. Alert and cooperative with normal attention span and concentration. No apparent delusions, illusions, hallucinations  Diabetes Management Exam:    Foot Exam (with socks and/or shoes not present):       Sensory-Pinprick/Light touch:          Left medial foot (L-4): normal          Left dorsal foot (L-5): normal          Left lateral foot (S-1): normal          Right medial foot (L-4): normal  Right dorsal foot (L-5): normal          Right lateral foot (S-1): normal       Sensory-Monofilament:          Left foot: normal          Right foot: normal       Inspection:          Left foot: normal          Right foot: normal       Nails:          Left foot: normal          Right foot: normal   Impression & Recommendations:  Problem # 1:  Preventive Health Care (ICD-V70.0) Reviewed preventive care protocols, scheduled due services, and updated immunizations. Encouraged exercise, weight loss, healthy eating habits. Needs to get back on track with losing weight and exercise.   Problem # 2:  Gynecological examination-routine (ICD-V72.31) DVE to eval ovaries.   Problem # 3:  HYPERCHOLESTEROLEMIA (ICD-272.0) Well controlled ..continue current meds.  Her updated medication list for this problem includes:    Simvastatin 40 Mg Tabs (Simvastatin) .Marland Kitchen... Take 1 tablet by mouth once a day  Problem # 4:  DIABETES MELLITUS, TYPE II (ICD-250.00) Worsened with poor diet since quiting smoking...discussed getting back on track.  Encouraged exercise, weight loss, healthy eating habits.  Her updated medication list for this problem includes:    Adult Aspirin Ec Low Strength 81 Mg Tbec (Aspirin) .Marland Kitchen... Take 1 tablet by mouth once a day    Metformin Hcl 500 Mg Tb24 (Metformin hcl) ..... Extended release take 2 tablet by mouth once a day  Problem # 5:  BIPOLAR AFFECTIVE DISORDER, DEPRESSED (ICD-296.50) stable.   Complete Medication List: 1)  Adult Aspirin Ec Low Strength 81 Mg Tbec (Aspirin) .... Take 1 tablet by mouth once a day 2)  Oxcarbazepine 300 Mg Tabs (Oxcarbazepine) .... Take tablet by mouth in the morning  and 2 tablets by mouth each evening 3)  Levothyroxine Sodium 200 Mcg Tabs (Levothyroxine sodium) .... Take 1 tablet by mouth once a day 4)  Endocet 7.5-325 Mg Tabs (Oxycodone-acetaminophen) .... Take 1 by mouth every 8 hours as needed 5)  Furosemide 80 Mg Tabs (Furosemide) .... Take 1/2 tab by mouth two times a day as needed 6)  Albuterol 90 Mcg/act Aers (Albuterol) .... 2 puff inh as needed 7)  Simvastatin 40 Mg Tabs (Simvastatin) .... Take 1 tablet by mouth once a day 8)  Metformin Hcl 500 Mg Tb24 (Metformin hcl) .... Extended release take 2 tablet by mouth once a day 9)  Advair Hfa 115-21 Mcg/act Aero (Fluticasone-salmeterol) .... 2 puff once daily 10)  Fish Oil Concentrate 300 Mg Caps (Omega-3 fatty acids) .... Take 1 capsule by mouth once a day 11)  Singulair 10 Mg Tabs (Montelukast sodium) .Marland Kitchen.. 1 tab by mouth qhs 12)  Celebrex 200 Mg Caps (Celecoxib) .... Take 1 capsule by mouth once a day or up to twice a day 13)  Lamotrigine 200 Mg Tabs (Lamotrigine) .... Once a day 14)  Allegra 180 Mg Tabs (Fexofenadine hcl) .Marland Kitchen.. 1 tab by mouth daily 15)  Bayer Contour Test Strp (Glucose blood) .... Patient tests once daily 16)  Tramadol Hcl 50 Mg Tabs (Tramadol hcl) .Marland Kitchen.. 1 tab by mouth at bedtime as needed breakthru pain  Other Orders: Radiology Referral (Radiology)  Lipid Assessment/Plan:      Based on NCEP/ATP  III, the patient's risk factor category is "  history of diabetes".  The patient's lipid goals are as follows: Total cholesterol goal is 200; LDL cholesterol goal is 100; HDL cholesterol goal is 40; Triglyceride goal is 150.  Her LDL cholesterol goal has been met.     Patient Instructions: 1)  Referral Appointment Information 2)  Day/Date: 3)  Time: 4)  Place/MD: 5)  Address: 6)  Phone/Fax: 7)  Patient given appointment information. Information/Orders faxed/mailed.  8)  BMP prior to visit, ICD-9: 250.00 ALL 9)  Hepatic Panel prior to visit ICD-9:  10)  Lipid panel prior to visit ICD-9 :  11)  HgBA1c prior to visit  ICD-9:  12)  Please schedule a follow-up appointment in 3 months  DM.  Current Allergies (reviewed today): ! SULFA   Past Medical History:    Reviewed history from 07/30/2007 and no changes required:       Anemia-NOS       Osteoarthritis       Asthma, moderate pesistant       Diabetes mellitus, type II       Hypothyroidism       GERD       Allergic rhinitis  Past Surgical History:    Reviewed history from 07/30/2007 and no changes required:       L hip replacement 2002       hysterectomy, partial abdominal 1982 for mennorhagia, no cervix left       foot surgery bone spur 1982       neck surgery, herniated disc C2,3,4       TMJ surgery 1982       hosp for bipolar 10 years ago   Last Flu Vaccine:  Fluvax 3+ (07/19/2009 10:16:56 AM) Flu Vaccine Next Due:  1 yr PAP Result Date:  10/17/2009 PAP Result:  DVE for ovaries, no uterus PAP Next Due:  3 yr Last DVE 10 years ago...no family hx ovarian cancer...will do DVE q 3 years.  Appended Document: Orders Update    Clinical Lists Changes  Medications: Rx of FUROSEMIDE 80 MG  TABS (FUROSEMIDE) Take 1/2 tab by mouth two times a day as needed;  #30 x 11;  Signed;  Entered by: [name] MD;  Authorized by: [name] MD;  Method used: Electronically to [facility]  [place]  [place], [place]  [address], Ph: [phone] or [phone], Fax: [phone]    Prescriptions: FUROSEMIDE 80 MG  TABS (FUROSEMIDE) Take 1/2 tab by mouth two times a day as needed  #30 x 11   Entered and Authorized by:   [name] MD   Signed by:   [name] MD on 10/17/2009   Method used:   Electronically to        [facility]  [place] (retail)       [address]       [place], [place]  [address]       Ph: [phone] or [phone]       Fax: [phone]   RxID:   [id number]

## 2010-10-29 NOTE — Letter (Signed)
[facility]   Imported By: Maryln Gottron 12/14/2009 15:55:09  _____________________________________________________________________  External Attachment:    Type:   Image     Comment:   External Document

## 2010-10-29 NOTE — Miscellaneous (Signed)
Summary: PT Certification/[facility]  PT Certification/[facility]   Imported By: Lanelle Bal 01/28/2010 08:58:12  _____________________________________________________________________  External Attachment:    Type:   Image     Comment:   External Document

## 2010-10-29 NOTE — Assessment & Plan Note (Signed)
Summary: ROA FOR 2 MONTH FOLLOW-UP/JRR   Vital Signs:  Patient profile:   60 year old female Height:      63.5 inches Weight:      327.0 pounds BMI:     57.22 Temp:     97.9 degrees F oral Pulse rate:   68 / minute Pulse rhythm:   regular BP sitting:   120 / 74  (left arm) Cuff size:   large  Vitals Entered By: Benny Lennert CMA Duncan Dull) (August 27, 2010 8:21 AM)  History of Present Illness: Chief complaint 2 month follow up   HTN, well controlled..no microalbuminuria on losartan.  DM, well controlled on victoza. Much improved since last check A1C in 03/2010..at 9.1! 30 lb weight loss since last OV.  Fasting blood sugar... 104-142..most around 113 Moderate diet control..no real exercsie...some shopping at mall.  High cholesterol, well controlled on simvastatin and fish oil   Ingrown toenail removal right great toe on 11/10. Returned on 11/22 for recheck.Marland Kitchengiven clindamycin given preventatively by foot MD. Did not tolerate clindamycin.  Toe healing well.    Problems Prior to Update: 1)  Hypertension  (ICD-401.9) 2)  Hip Pain  (ICD-719.45) 3)  Cervical Radiculopathy, Left  (ICD-723.4) 4)  Preventive Health Care  (ICD-V70.0) 5)  Routine Gynecological Examination  (ICD-V72.31) 6)  Asthma, With Acute Exacerbation  (ICD-493.92) 7)  Diabetic Peripheral Neuropathy  (ICD-250.60) 8)  Benign Positional Vertigo  (ICD-386.11) 9)  Urinary Incontinence, Mixed  (ICD-788.33) 10)  Screening For Mlig Neop, Breast, Nos  (ICD-V76.10) 11)  Hypercholesterolemia  (ICD-272.0) 12)  Family History of Cad Female 1st Degree Relative <50  (ICD-V17.3) 13)  Family History of Cad Female 1st Degree Relative <60  (ICD-V16.49) 14)  Allergic Rhinitis  (ICD-477.9) 15)  Sleep Apnea  (ICD-780.57) 16)  Gerd  (ICD-530.81) 17)  Hypothyroidism  (ICD-244.9) 18)  Bipolar Affective Disorder, Depressed  (ICD-296.50) 19)  Diabetes Mellitus, Type II  (ICD-250.00) 20)  Asthma  (ICD-493.90) 21)  Osteoarthritis   (ICD-715.90) 22)  Anemia-nos  (ICD-285.9)  Current Medications (verified): 1)  Adult Aspirin Ec Low Strength 81 Mg  Tbec (Aspirin) .... Take 1 Tablet By Mouth Once A Day 2)  Oxcarbazepine 300 Mg  Tabs (Oxcarbazepine) .... Take Tablet By Mouth in The Morning  and 2 Tablets By Mouth Each Evening 3)  Levothyroxine Sodium 200 Mcg  Tabs (Levothyroxine Sodium) .... Take 1 Tablet By Mouth Once A Day 4)  Furosemide 80 Mg  Tabs (Furosemide) .... Take 1/2 Tab By Mouth Two Times A Day As Needed 5)  Albuterol 90 Mcg/act  Aers (Albuterol) .... 2 Puff Inh As Needed 6)  Simvastatin 40 Mg  Tabs (Simvastatin) .... Take 1 Tablet By Mouth Once A Day 7)  Metformin Hcl 500 Mg  Tb24 (Metformin Hcl) .... Extended Release Take 2 Tablet By Mouth Once A Day 8)  Advair Hfa 115-21 Mcg/act  Aero (Fluticasone-Salmeterol) .... 2 Puff Once Daily 9)  Fish Oil Concentrate 300 Mg  Caps (Omega-3 Fatty Acids) .... Take 1 Capsule By Mouth Once A Day 10)  Singulair 10 Mg  Tabs (Montelukast Sodium) .Marland Kitchen.. 1 Tab By Mouth Qhs 11)  Celebrex 200 Mg Caps (Celecoxib) .... Take 1 Capsule By Mouth Once A Day or Up To Twice A Day 12)  Lamotrigine 200 Mg Tabs (Lamotrigine) .... Once A Day 13)  Allegra 180 Mg Tabs (Fexofenadine Hcl) .Marland Kitchen.. 1 Tab By Mouth Daily As Needed 14)  Percocet 10-325 Mg Tabs (Oxycodone-Acetaminophen) .Marland Kitchen.. 1-2 Tablets Every 4-6 Hours  As Needed For Pain 15)  Mucinex 600 Mg Xr12h-Tab (Guaifenesin) .... Otc As Directed. 16)  Victoza 18 Mg/57ml Soln (Liraglutide) .... 0.6 Mg  Subcutaneously Daily X 1 Week Then Increase To 1.2 Mg Daily 17)  Losartan Potassium 50 Mg Tabs (Losartan Potassium) .Marland Kitchen.. 1 Tab By Mouth Daily  Allergies: 1)  ! Sulfa  Past History:  Past medical, surgical, family and social histories (including risk factors) reviewed, and no changes noted (except as noted below).  Past Medical History: Reviewed history from 07/30/2007 and no changes required. Anemia-NOS Osteoarthritis Asthma, moderate  pesistant Diabetes mellitus, type II Hypothyroidism GERD Allergic rhinitis  Past Surgical History: Reviewed history from 07/30/2007 and no changes required. L hip replacement 2002 hysterectomy, partial abdominal 1982 for mennorhagia, no cervix left foot surgery bone spur 1982 neck surgery, herniated disc C2,3,4 TMJ surgery 1982 hosp for bipolar 10 years ago  Family History: Reviewed history from 07/30/2007 and no changes required. father died age 28 aneyrsym, alcoholism mother died age 107 MVA 5 siblings: CAD, MI in 33s, late 40s Family History of CAD Female 1st degree relative <60 Family History of CAD Female 1st degree relative <50  Social History: Reviewed history from 07/30/2007 and no changes required. 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 CV:  Denies chest pain or discomfort. Resp:  Denies shortness of breath. GI:  Denies abdominal pain. GU:  Denies dysuria.  Physical Exam  General:  morbidly obese female in NAd Mouth:  Oral mucosa and oropharynx without lesions or exudates.  Teeth in good repair. Neck:  no carotid bruit or thyromegaly no cervical or supraclavicular lymphadenopathy  Lungs:  Normal respiratory effort, chest expands symmetrically. Lungs are clear to auscultation, no crackles or wheezes. Heart:  Normal rate and regular rhythm. S1 and S2 normal without gallop, murmur, click, rub or other extra sounds. Abdomen:  Bowel sounds positive,abdomen soft and non-tender without masses, organomegaly or hernias noted. Pulses:  R and L posterior tibial pulses are full and equal bilaterally  Extremities:  no edema Skin:  Intact without suspicious lesions or rashes Psych:  Cognition and judgment appear intact. Alert and cooperative with normal attention span and concentration. No apparent  delusions, illusions, hallucinations  Diabetes Management Exam:    Foot Exam (with socks and/or shoes not present):       Sensory-Pinprick/Light touch:          Left medial foot (L-4): normal          Left dorsal foot (L-5): normal          Left lateral foot (S-1): normal          Right medial foot (L-4): normal          Right dorsal foot (L-5): normal          Right lateral foot (S-1): normal       Sensory-Monofilament:          Left foot: normal          Right foot: normal       Inspection:          Left foot: normal          Right foot: abnormal             Comments: right great toe... healing ingrown toenail removal..minimal erythema at base of nail...no discharge.  Nails:          Left foot: normal          Right foot: normal   Impression & Recommendations:  Problem # 1:  HYPERTENSION (ICD-401.9)  Well controlled. Continue current medication.  Her updated medication list for this problem includes:    Furosemide 80 Mg Tabs (Furosemide) .Marland Kitchen... Take 1/2 tab by mouth two times a day as needed    Losartan Potassium 50 Mg Tabs (Losartan potassium) .Marland Kitchen... 1 tab by mouth daily  BP today: 120/74 Prior BP: 120/70 (06/25/2010)  Prior 10 Yr Risk Heart Disease: 6 % (01/29/2010)  Labs Reviewed: K+: 4.3 (08/19/2010) Creat: : 0.6 (08/19/2010)   Chol: 120 (08/19/2010)   HDL: 50.50 (08/19/2010)   LDL: 59 (08/19/2010)   TG: 52.0 (08/19/2010)  Problem # 2:  HYPERCHOLESTEROLEMIA (ICD-272.0)  Well controlled. Continue current medication.  Her updated medication list for this problem includes:    Simvastatin 40 Mg Tabs (Simvastatin) .Marland Kitchen... Take 1 tablet by mouth once a day  Labs Reviewed: SGOT: 23 (08/19/2010)   SGPT: 31 (08/19/2010)  Lipid Goals: Chol Goal: 200 (10/03/2008)   HDL Goal: 40 (10/03/2008)   LDL Goal: 100 (10/03/2008)   TG Goal: 150 (10/03/2008)  Prior 10 Yr Risk Heart Disease: 6 % (01/29/2010)   HDL:50.50 (08/19/2010), 60.00 (01/22/2010)  LDL:59 (08/19/2010), 64  (04/54/0981)  Chol:120 (08/19/2010), 132 (01/22/2010)  Trig:52.0 (08/19/2010), 40.0 (01/22/2010)  Problem # 3:  DIABETES MELLITUS, TYPE II (ICD-250.00) Well controlled. Continue current medication.  Her updated medication list for this problem includes:    Adult Aspirin Ec Low Strength 81 Mg Tbec (Aspirin) .Marland Kitchen... Take 1 tablet by mouth once a day    Metformin Hcl 500 Mg Tb24 (Metformin hcl) ..... Extended release take 2 tablet by mouth once a day    Victoza 18 Mg/77ml Soln (Liraglutide) .Marland Kitchen... 0.6 mg  subcutaneously daily x 1 week then increase to 1.2 mg daily    Losartan Potassium 50 Mg Tabs (Losartan potassium) .Marland Kitchen... 1 tab by mouth daily  Problem # 4:  ASTHMA (ICD-493.90) Well controlled. Continue current medication.  Her updated medication list for this problem includes:    Albuterol 90 Mcg/act Aers (Albuterol) .Marland Kitchen... 2 puff inh as needed    Advair Hfa 115-21 Mcg/act Aero (Fluticasone-salmeterol) .Marland Kitchen... 2 puff once daily    Singulair 10 Mg Tabs (Montelukast sodium) .Marland Kitchen... 1 tab by mouth qhs  Complete Medication List: 1)  Adult Aspirin Ec Low Strength 81 Mg Tbec (Aspirin) .... Take 1 tablet by mouth once a day 2)  Oxcarbazepine 300 Mg Tabs (Oxcarbazepine) .... Take tablet by mouth in the morning  and 2 tablets by mouth each evening 3)  Levothyroxine Sodium 200 Mcg Tabs (Levothyroxine sodium) .... Take 1 tablet by mouth once a day 4)  Furosemide 80 Mg Tabs (Furosemide) .... Take 1/2 tab by mouth two times a day as needed 5)  Albuterol 90 Mcg/act Aers (Albuterol) .... 2 puff inh as needed 6)  Simvastatin 40 Mg Tabs (Simvastatin) .... Take 1 tablet by mouth once a day 7)  Metformin Hcl 500 Mg Tb24 (Metformin hcl) .... Extended release take 2 tablet by mouth once a day 8)  Advair Hfa 115-21 Mcg/act Aero (Fluticasone-salmeterol) .... 2 puff once daily 9)  Fish Oil Concentrate 300 Mg Caps (Omega-3 fatty acids) .... Take 1 capsule by mouth once a day 10)  Singulair 10 Mg Tabs (Montelukast sodium)  .Marland Kitchen.. 1 tab by mouth qhs 11)  Celebrex 200 Mg Caps (Celecoxib) .Marland KitchenMarland KitchenMarland Kitchen  Take 1 capsule by mouth once a day or up to twice a day 12)  Lamotrigine 200 Mg Tabs (Lamotrigine) .... Once a day 13)  Allegra 180 Mg Tabs (Fexofenadine hcl) .Marland Kitchen.. 1 tab by mouth daily as needed 14)  Percocet 10-325 Mg Tabs (Oxycodone-acetaminophen) .Marland Kitchen.. 1-2 tablets every 4-6 hours as needed for pain 15)  Mucinex 600 Mg Xr12h-tab (Guaifenesin) .... Otc as directed. 16)  Victoza 18 Mg/46ml Soln (Liraglutide) .... 0.6 mg  subcutaneously daily x 1 week then increase to 1.2 mg daily 17)  Losartan Potassium 50 Mg Tabs (Losartan potassium) .Marland Kitchen.. 1 tab by mouth daily  Patient Instructions: 1)  Please schedule a follow-up appointment in 3 months for CPX .  2)  HgBA1c prior to visit  ICD-9:  250.00   Orders Added: 1)  Est. Patient Level IV [[address]]    Current Allergies (reviewed today): ! SULFA

## 2010-10-29 NOTE — Letter (Signed)
Summary: [facility]  [facility]   Imported By: Lanelle Bal 03/22/2010 11:42:40  _____________________________________________________________________  External Attachment:    Type:   Image     Comment:   External Document

## 2010-10-29 NOTE — Miscellaneous (Signed)
Summary: PT Certification/[facility]  PT Certification/[facility]   Imported By: Lanelle Bal 02/05/2010 11:51:26  _____________________________________________________________________  External Attachment:    Type:   Image     Comment:   External Document

## 2010-10-29 NOTE — Letter (Signed)
Summary: [facility]   Imported By: Maryln Gottron 12/14/2009 15:53:57  _____________________________________________________________________  External Attachment:    Type:   Image     Comment:   External Document

## 2010-10-29 NOTE — Assessment & Plan Note (Signed)
Summary: F/U DLO   Vital Signs:  Patient profile:   60 year old female Height:      63.5 inches Weight:      369.6 pounds BMI:     64.68 Temp:     97.8 degrees F oral Pulse rate:   68 / minute Pulse rhythm:   regular BP sitting:   130 / 90  (left arm) Cuff size:   large  Vitals Entered By: Benny Lennert CMA Duncan Dull) (May 17, 2010 9:57 AM)  History of Present Illness: Chief complaint follow up   DM, poor control: Has been on steroid for infection/asthma..has gained weight. On metformin XR 2 tabs once daily. No exercsie due to hip pain. Moderated diet. Fasting CBGs...280. Not on steroids currently.   Increase stress, family members ill.  BPs mildly elevated at last few OVs. On furosemide   Problems Prior to Update: 1)  Dizziness  (ICD-780.4) 2)  Hypertension  (ICD-401.9) 3)  Other Abnormality of Urination  (ICD-788.69) 4)  Hip Pain  (ICD-719.45) 5)  Cervical Radiculopathy, Left  (ICD-723.4) 6)  Preventive Health Care  (ICD-V70.0) 7)  Routine Gynecological Examination  (ICD-V72.31) 8)  Asthma, With Acute Exacerbation  (ICD-493.92) 9)  Diabetic Peripheral Neuropathy  (ICD-250.60) 10)  Benign Positional Vertigo  (ICD-386.11) 11)  Urinary Incontinence, Mixed  (ICD-788.33) 12)  Screening For Mlig Neop, Breast, Nos  (ICD-V76.10) 13)  Hypercholesterolemia  (ICD-272.0) 14)  Family History of Cad Female 1st Degree Relative <50  (ICD-V17.3) 15)  Family History of Cad Female 1st Degree Relative <60  (ICD-V16.49) 16)  Allergic Rhinitis  (ICD-477.9) 17)  Sleep Apnea  (ICD-780.57) 18)  Gerd  (ICD-530.81) 19)  Hypothyroidism  (ICD-244.9) 20)  Bipolar Affective Disorder, Depressed  (ICD-296.50) 21)  Diabetes Mellitus, Type II  (ICD-250.00) 22)  Asthma  (ICD-493.90) 23)  Osteoarthritis  (ICD-715.90) 24)  Anemia-nos  (ICD-285.9)  Current Medications (verified): 1)  Adult Aspirin Ec Low Strength 81 Mg  Tbec (Aspirin) .... Take 1 Tablet By Mouth Once A Day 2)  Oxcarbazepine 300  Mg  Tabs (Oxcarbazepine) .... Take Tablet By Mouth in The Morning  and 2 Tablets By Mouth Each Evening 3)  Levothyroxine Sodium 200 Mcg  Tabs (Levothyroxine Sodium) .... Take 1 Tablet By Mouth Once A Day 4)  Furosemide 80 Mg  Tabs (Furosemide) .... Take 1/2 Tab By Mouth Two Times A Day As Needed 5)  Albuterol 90 Mcg/act  Aers (Albuterol) .... 2 Puff Inh As Needed 6)  Simvastatin 40 Mg  Tabs (Simvastatin) .... Take 1 Tablet By Mouth Once A Day 7)  Metformin Hcl 500 Mg  Tb24 (Metformin Hcl) .... Extended Release Take 2 Tablet By Mouth Once A Day 8)  Advair Hfa 115-21 Mcg/act  Aero (Fluticasone-Salmeterol) .... 2 Puff Once Daily 9)  Fish Oil Concentrate 300 Mg  Caps (Omega-3 Fatty Acids) .... Take 1 Capsule By Mouth Once A Day 10)  Singulair 10 Mg  Tabs (Montelukast Sodium) .Marland Kitchen.. 1 Tab By Mouth Qhs 11)  Celebrex 200 Mg Caps (Celecoxib) .... Take 1 Capsule By Mouth Once A Day or Up To Twice A Day 12)  Lamotrigine 200 Mg Tabs (Lamotrigine) .... Once A Day 13)  Allegra 180 Mg Tabs (Fexofenadine Hcl) .Marland Kitchen.. 1 Tab By Mouth Daily As Needed 14)  Percocet 10-325 Mg Tabs (Oxycodone-Acetaminophen) .Marland Kitchen.. 1-2 Tablets Every 4-6 Hours As Needed For Pain 15)  Mucinex 600 Mg Xr12h-Tab (Guaifenesin) .... Otc As Directed. 16)  Victoza 18 Mg/66ml Soln (Liraglutide) .... 0.6 Mg  Subcutaneously Daily X 1 Week Then Increase To 1.2 Mg Daily 17)  Losartan Potassium 50 Mg Tabs (Losartan Potassium) .Marland Kitchen.. 1 Tab By Mouth Daily  Allergies: 1)  ! Sulfa  Past History:  Past medical, surgical, family and social histories (including risk factors) reviewed, and no changes noted (except as noted below).  Past Medical History: Reviewed history from 07/30/2007 and no changes required. Anemia-NOS Osteoarthritis Asthma, moderate pesistant Diabetes mellitus, type II Hypothyroidism GERD Allergic rhinitis  Past Surgical History: Reviewed history from 07/30/2007 and no changes required. L hip replacement 2002 hysterectomy, partial  abdominal 1982 for mennorhagia, no cervix left foot surgery bone spur 1982 neck surgery, herniated disc C2,3,4 TMJ surgery 1982 hosp for bipolar 10 years ago  Family History: Reviewed history from 07/30/2007 and no changes required. father died age 23 aneyrsym, alcoholism mother died age 22 MVA 5 siblings: CAD, MI in 36s, late 32s Family History of CAD Female 1st degree relative <60 Family History of CAD Female 1st degree relative <50  Social History: Reviewed history from 07/30/2007 and no changes required. 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  General:  Denies fatigue. CV:  Denies chest pain or discomfort. Resp:  Denies shortness of breath, sputum productive, and wheezing. GI:  Complains of gas; denies abdominal pain and bloody stools. GU:  Denies abnormal vaginal bleeding, dysuria, urinary frequency, and urinary hesitancy; pressure wuth urinating, decrease UOP.  Physical Exam  General:  morbidly obese female in NAd Head:  no maxillary sinus ttp Eyes:  No corneal or conjunctival inflammation noted. EOMI. Perrla. Funduscopic exam benign, without hemorrhages, exudates or papilledema. Vision grossly normal. Ears:  External ear exam shows no significant lesions or deformities.  Otoscopic examination reveals clear canals, tympanic membranes are intact bilaterally without bulging, retraction, inflammation or discharge. Hearing is grossly normal bilaterally. Nose:  External nasal examination shows no deformity or inflammation. Nasal mucosa are pink and moist without lesions or exudates. Mouth:  Oral mucosa and oropharynx without lesions or exudates.  Teeth in good repair. Neck:  no carotid bruit or thyromegaly no cervical or  supraclavicular lymphadenopathy  Lungs:  Normal respiratory effort, chest expands symmetrically. Lungs are clear to auscultation, no crackles or wheezes. Heart:  Normal rate and regular rhythm. S1 and S2 normal without gallop, murmur, click, rub or other extra sounds. Abdomen:  Bowel sounds positive,abdomen soft and non-tender without masses, organomegaly or hernias noted. Msk:   ttp central cervical spine and  ttp left trapezius left shoulder full ROM, Neg  impingement sign. Neg drop arm test neg crossover Pulses:  R and L posterior tibial pulses are full and equal bilaterally  Extremities:  no edema Skin:  Intact without suspicious lesions or rashes Psych:  Cognition and judgment appear intact. Alert and cooperative with normal attention span and concentration. No apparent delusions, illusions, hallucinations  Diabetes Management Exam:    Foot Exam (with socks and/or shoes not present):       Sensory-Pinprick/Light touch:          Left medial foot (L-4): normal          Left dorsal foot (L-5): normal          Left lateral foot (S-1): normal  Right medial foot (L-4): normal          Right dorsal foot (L-5): normal          Right lateral foot (S-1): normal       Sensory-Monofilament:          Left foot: normal          Right foot: normal       Inspection:          Left foot: normal          Right foot: normal       Nails:          Left foot: normal          Right foot: normal   Impression & Recommendations:  Problem # 1:  DIABETES MELLITUS, TYPE II (ICD-250.00) Poor control. Discussed need for exercise and weight control.  Start victoza..medication and med  administraton  reviewed in detail. Pt questions answered.  Her updated medication list for this problem includes:    Adult Aspirin Ec Low Strength 81 Mg Tbec (Aspirin) .Marland Kitchen... Take 1 tablet by mouth once a day    Metformin Hcl 500 Mg Tb24 (Metformin hcl) ..... Extended release take 2 tablet by mouth once a day     Victoza 18 Mg/40ml Soln (Liraglutide) .Marland Kitchen... 0.6 mg  subcutaneously daily x 1 week then increase to 1.2 mg daily    Losartan Potassium 50 Mg Tabs (Losartan potassium) .Marland Kitchen... 1 tab by mouth daily  Problem # 2:  HYPERTENSION (ICD-401.9) INadequate control start losartan ( no ACEI given asthma). Work on low salt diet and healthy eatng habits. Weight loss is absolutely necessary.  Her updated medication list for this problem includes:    Furosemide 80 Mg Tabs (Furosemide) .Marland Kitchen... Take 1/2 tab by mouth two times a day as needed    Losartan Potassium 50 Mg Tabs (Losartan potassium) .Marland Kitchen... 1 tab by mouth daily  BP today: 130/90 Prior BP: 150/90 (03/18/2010)  Prior 10 Yr Risk Heart Disease: 6 % (01/29/2010)  Labs Reviewed: K+: 4.9 (01/22/2010) Creat: : 0.6 (01/22/2010)   Chol: 132 (01/22/2010)   HDL: 60.00 (01/22/2010)   LDL: 64 (01/22/2010)   TG: 40.0 (01/22/2010)  Problem # 3:  OTHER ABNORMALITY OF URINATION (ICD-788.69) No eveidence of UTI Orders: UA Dipstick w/o Micro (manual) (72536) T-Culture, Urine (64403-47425) Specimen Handling (99000)  Problem # 4:  DIZZINESS (ICD-780.4) Off balance but no definate vertigo symptoms.  Possible due to recent sinus infections vs high blood sugars.  Eval further if not resolving with improvement in blood sugar.  Her updated medication list for this problem includes:    Allegra 180 Mg Tabs (Fexofenadine hcl) .Marland Kitchen... 1 tab by mouth daily as needed  Problem # 5:  HIP PAIN (ICD-719.45) On celebreax and using percocet rarely for pain.  needs to get exercsie non weight bearing and to loose weight. Refer to ORTHo when pt ready for further eval.  The following medications were removed from the medication list:    Endocet 7.5-325 Mg Tabs (Oxycodone-acetaminophen) .Marland Kitchen... Take 1 by mouth every 8 hours as needed    Tramadol Hcl 50 Mg Tabs (Tramadol hcl) .Marland Kitchen... 1 tab by mouth at bedtime as needed breakthru pain    Morphine Sulfate 15 Mg Tabs (Morphine sulfate) .Marland Kitchen...  Take one tablet every 6 hours as needed for pain    Cyclobenzaprine Hcl 10 Mg Tabs (Cyclobenzaprine hcl) .Marland Kitchen... 1 tab by mouth at bedtime as needed muscle spasm Her updated medication list  for this problem includes:    Adult Aspirin Ec Low Strength 81 Mg Tbec (Aspirin) .Marland Kitchen... Take 1 tablet by mouth once a day    Celebrex 200 Mg Caps (Celecoxib) .Marland Kitchen... Take 1 capsule by mouth once a day or up to twice a day    Percocet 10-325 Mg Tabs (Oxycodone-acetaminophen) .Marland Kitchen... 1-2 tablets every 4-6 hours as needed for pain  Complete Medication List: 1)  Adult Aspirin Ec Low Strength 81 Mg Tbec (Aspirin) .... Take 1 tablet by mouth once a day 2)  Oxcarbazepine 300 Mg Tabs (Oxcarbazepine) .... Take tablet by mouth in the morning  and 2 tablets by mouth each evening 3)  Levothyroxine Sodium 200 Mcg Tabs (Levothyroxine sodium) .... Take 1 tablet by mouth once a day 4)  Furosemide 80 Mg Tabs (Furosemide) .... Take 1/2 tab by mouth two times a day as needed 5)  Albuterol 90 Mcg/act Aers (Albuterol) .... 2 puff inh as needed 6)  Simvastatin 40 Mg Tabs (Simvastatin) .... Take 1 tablet by mouth once a day 7)  Metformin Hcl 500 Mg Tb24 (Metformin hcl) .... Extended release take 2 tablet by mouth once a day 8)  Advair Hfa 115-21 Mcg/act Aero (Fluticasone-salmeterol) .... 2 puff once daily 9)  Fish Oil Concentrate 300 Mg Caps (Omega-3 fatty acids) .... Take 1 capsule by mouth once a day 10)  Singulair 10 Mg Tabs (Montelukast sodium) .Marland Kitchen.. 1 tab by mouth qhs 11)  Celebrex 200 Mg Caps (Celecoxib) .... Take 1 capsule by mouth once a day or up to twice a day 12)  Lamotrigine 200 Mg Tabs (Lamotrigine) .... Once a day 13)  Allegra 180 Mg Tabs (Fexofenadine hcl) .Marland Kitchen.. 1 tab by mouth daily as needed 14)  Percocet 10-325 Mg Tabs (Oxycodone-acetaminophen) .Marland Kitchen.. 1-2 tablets every 4-6 hours as needed for pain 15)  Mucinex 600 Mg Xr12h-tab (Guaifenesin) .... Otc as directed. 16)  Victoza 18 Mg/17ml Soln (Liraglutide) .... 0.6 mg   subcutaneously daily x 1 week then increase to 1.2 mg daily 17)  Losartan Potassium 50 Mg Tabs (Losartan potassium) .Marland Kitchen.. 1 tab by mouth daily  Patient Instructions: 1)  Start victoza. 2)  Call if too expensive. 3)  Start losartan..follow BP at home.Marland Kitchengoal <130/80. 4)   Start water aerobics. 5)  Call for [facility] referral when need. 6)  Call iof blood sugars remain >200 fasting..to start glipizide as well.  7)  Follow up in 1 month DM 30 min OV.  Prescriptions: PERCOCET 10-325 MG TABS (OXYCODONE-ACETAMINOPHEN) 1-2 tablets every 4-6 hours as needed for pain  #30 x 0   Entered and Authorized by:   [name] MD   Signed by:   [name] MD on 05/17/2010   Method used:   Print then Give to Patient   RxID:   [id number] POTASSIUM 50 MG TABS (LOSARTAN POTASSIUM) 1 tab by mouth daily  #30 x 11   Entered and Authorized by:   [name] MD   Signed by:   [name] MD on 05/17/2010   Method used:   Electronically to        [facility]  [address] (retail)       [address]       [place], [place]  [address]       Ph: [phone] or [phone]       Fax: [phone]   RxID:   [id number] VICTOZA 18 MG/3ML SOLN (LIRAGLUTIDE) 0.6 mg  Subcutaneously daily x 1 week then increase to 1.2 mg  daily  #1 box x 11   Entered and Authorized by:   [name] MD   Signed by:   [name] MD on 05/17/2010   Method used:   [facility] to        [id number] (retail[facility]       [address]       [place], [place]  [address]       Ph: [phone] or [phone]       Fax: [phone]   RxID:   [id number]   Current Allergies (reviewed today): ! SULFA  Laboratory Results   Urine Tests  Date/Time Received: May 17, 2010 10:43 AM  Date/Time Reported: May 17, 2010 10:43 AM   Routine Urinalysis   Color: yellow Appearance: Clear Glucose: >=1000   (Normal Range: Negative) Bilirubin: negative   (Normal Range: Negative) Ketone: negative   (Normal  Range: Negative) Spec. Gravity: 1.020   (Normal Range: 1.003-1.035) Blood: negative   (Normal Range: Negative) pH: 7.5   (Normal Range: 5.0-8.0) Protein: trace   (Normal Range: Negative) Urobilinogen: 0.2   (Normal Range: 0-1) Nitrite: positive   (Normal Range: Negative) Leukocyte Esterace: small   (Normal Range: Negative)  Urine Microscopic WBC/HPF: 0 RBC/HPF: 0 Epithelial/HPF: several.    Comments: Contaminated with skin cells

## 2010-10-29 NOTE — Assessment & Plan Note (Signed)
Summary: shoulder and arm pain/ alc   Vital Signs:  Patient profile:   60 year old female Height:      63.5 inches Weight:      359.4 pounds BMI:     62.89 Temp:     97.5 degrees F oral Pulse rate:   80 / minute Pulse rhythm:   regular BP sitting:   130 / 70  (left arm) Cuff size:   large  Vitals Entered By: Benny Lennert CMA Duncan Dull) (December 25, 2009 3:19 PM)  History of Present Illness: Chief complaint left shoulder and arm pain  10/2009...began having left neck, shoulder and arm pain. Radiated to arm. Numbness in hand. Seen at Urgent Care..given pain medication..minimal help. Saw old primary MD in [place].Marland Kitchengiven prednisone, pain med and muscle relaxant. Minimal improvement Referred to Spine ortho: Dr. Glenice Laine.  X-rays and MRI showed C3-C4 disc herniation near previous fusion. Referred  for pain MD ... Dr. Trenton Gammon PMR in [place]. Set up with PT. and MRI shoulder recommended to eval for rotator cuff tear?  Has had some improvemnt with PT after 5 sessions...using percocet 10/325 mg 1-2 times daily..pain now 3-4/10.  On celebrex.   Problems Prior to Update: 1)  Preventive Health Care  (ICD-V70.0) 2)  Routine Gynecological Examination  (ICD-V72.31) 3)  Asthma, With Acute Exacerbation  (ICD-493.92) 4)  Diabetic Peripheral Neuropathy  (ICD-250.60) 5)  Benign Positional Vertigo  (ICD-386.11) 6)  Urinary Incontinence, Mixed  (ICD-788.33) 7)  Screening For Mlig Neop, Breast, Nos  (ICD-V76.10) 8)  Hypercholesterolemia  (ICD-272.0) 9)  Family History of Cad Female 1st Degree Relative <50  (ICD-V17.3) 10)  Family History of Cad Female 1st Degree Relative <60  (ICD-V16.49) 11)  Allergic Rhinitis  (ICD-477.9) 12)  Sleep Apnea  (ICD-780.57) 13)  Gerd  (ICD-530.81) 14)  Hypothyroidism  (ICD-244.9) 15)  Bipolar Affective Disorder, Depressed  (ICD-296.50) 16)  Diabetes Mellitus, Type II  (ICD-250.00) 17)  Asthma  (ICD-493.90) 18)  Osteoarthritis  (ICD-715.90) 19)  Anemia-nos   (ICD-285.9)  Current Medications (verified): 1)  Adult Aspirin Ec Low Strength 81 Mg  Tbec (Aspirin) .... Take 1 Tablet By Mouth Once A Day 2)  Oxcarbazepine 300 Mg  Tabs (Oxcarbazepine) .... Take Tablet By Mouth in The Morning  and 2 Tablets By Mouth Each Evening 3)  Levothyroxine Sodium 200 Mcg  Tabs (Levothyroxine Sodium) .... Take 1 Tablet By Mouth Once A Day 4)  Endocet 7.5-325 Mg  Tabs (Oxycodone-Acetaminophen) .... Take 1 By Mouth Every 8 Hours As Needed 5)  Furosemide 80 Mg  Tabs (Furosemide) .... Take 1/2 Tab By Mouth Two Times A Day As Needed 6)  Albuterol 90 Mcg/act  Aers (Albuterol) .... 2 Puff Inh As Needed 7)  Simvastatin 40 Mg  Tabs (Simvastatin) .... Take 1 Tablet By Mouth Once A Day 8)  Metformin Hcl 500 Mg  Tb24 (Metformin Hcl) .... Extended Release Take 2 Tablet By Mouth Once A Day 9)  Advair Hfa 115-21 Mcg/act  Aero (Fluticasone-Salmeterol) .... 2 Puff Once Daily 10)  Fish Oil Concentrate 300 Mg  Caps (Omega-3 Fatty Acids) .... Take 1 Capsule By Mouth Once A Day 11)  Singulair 10 Mg  Tabs (Montelukast Sodium) .Marland Kitchen.. 1 Tab By Mouth Qhs 12)  Celebrex 200 Mg Caps (Celecoxib) .... Take 1 Capsule By Mouth Once A Day or Up To Twice A Day 13)  Lamotrigine 200 Mg Tabs (Lamotrigine) .... Once A Day 14)  Allegra 180 Mg Tabs (Fexofenadine Hcl) .Marland Kitchen.. 1 Tab By Mouth Daily  15)  Bayer Contour Test  Strp (Glucose Blood) .... Patient Tests Once Daily 16)  Tramadol Hcl 50 Mg Tabs (Tramadol Hcl) .Marland Kitchen.. 1 Tab By Mouth At Bedtime As Needed Breakthru Pain 17)  Morphine Sulfate 15 Mg Tabs (Morphine Sulfate) .... Take One Tablet Every 6 Hours As Needed For Pain 18)  Percocet 10-325 Mg Tabs (Oxycodone-Acetaminophen) .Marland Kitchen.. 1-2 Tablets Every 4-6 Hours As Needed For Pain 19)  Black Cohosh Hot Flash Relief 40 Mg Caps (Black Cohosh) 20)  Cyclobenzaprine Hcl 10 Mg Tabs (Cyclobenzaprine Hcl) .Marland Kitchen.. 1 Tab By Mouth At Bedtime As Needed Muscle Spasm  Allergies: 1)  ! Sulfa  Past History:  Past medical,  surgical, family and social histories (including risk factors) reviewed, and no changes noted (except as noted below).  Past Medical History: Reviewed history from 07/30/2007 and no changes required. Anemia-NOS Osteoarthritis Asthma, moderate pesistant Diabetes mellitus, type II Hypothyroidism GERD Allergic rhinitis  Past Surgical History: Reviewed history from 07/30/2007 and no changes required. L hip replacement 2002 hysterectomy, partial abdominal 1982 for mennorhagia, no cervix left foot surgery bone spur 1982 neck surgery, herniated disc C2,3,4 TMJ surgery 1982 hosp for bipolar 10 years ago  Family History: Reviewed history from 07/30/2007 and no changes required. father died age 67 aneyrsym, alcoholism mother died age 79 MVA 5 siblings: CAD, MI in 54s, late 2s Family History of CAD Female 1st degree relative <60 Family History of CAD Female 1st degree relative <50  Social History: Reviewed history from 07/30/2007 and no changes required. 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 Lungs are clear to auscultation, no crackles or wheezes. Heart:  Normal rate and regular rhythm. S1 and S2 normal without gallop, murmur, click, rub or other extra sounds. Msk:   ttp central cervical spine and  ttp left trapezius left shoulder full ROM, Neg  impingement sign. Neg drop arm test neg crossover Neurologic:  alert & oriented X3, cranial nerves II-XII intact, and strength normal in all extremities   Impression & Recommendations:  Problem # 1:  CERVICAL RADICULOPATHY, LEFT (ICD-723.4) NSAIds, gentle stretching and heat. Given PT was helping  recently when in [place]..will contnue here with a referral. Will also refer to PMR/pain center for ? cervical spine injections etc.  No clear evidence of rotator cuff involvement today..I don't think MRI shoulder is necessary at this point.  Use limited narcotic and musle relaxant for break thru pain. Orders: Pain Clinic Referral (Pain) Physical Therapy Referral (PT)  Complete Medication List: 1)  Adult Aspirin Ec Low Strength 81 Mg Tbec (Aspirin) .... Take 1 tablet by mouth once a day 2)  Oxcarbazepine 300 Mg Tabs (Oxcarbazepine) .... Take tablet by mouth in the morning  and 2 tablets by mouth each evening 3)  Levothyroxine Sodium 200 Mcg Tabs (Levothyroxine sodium) .... Take 1 tablet by mouth once a day 4)  Endocet 7.5-325 Mg Tabs (Oxycodone-acetaminophen) .... Take 1 by mouth every 8 hours as needed 5)  Furosemide 80 Mg Tabs (Furosemide) .... Take 1/2 tab by mouth two times a day as needed 6)  Albuterol 90 Mcg/act Aers (Albuterol) .... 2 puff inh as needed 7)  Simvastatin 40 Mg Tabs (Simvastatin) .... Take 1 tablet by mouth once a  day 8)  Metformin Hcl 500 Mg Tb24 (Metformin hcl) .... Extended release take 2 tablet by mouth once a day 9)  Advair Hfa 115-21 Mcg/act Aero (Fluticasone-salmeterol) .... 2 puff once daily 10)  Fish Oil Concentrate 300 Mg Caps (Omega-3 fatty acids) .... Take 1 capsule by mouth once a day 11)  Singulair 10 Mg Tabs (Montelukast sodium) .Marland Kitchen.. 1 tab by mouth qhs 12)  Celebrex 200 Mg Caps (Celecoxib) .... Take 1 capsule by mouth once a day or up to twice a day 13)  Lamotrigine 200 Mg Tabs (Lamotrigine) .... Once a day 14)  Allegra 180 Mg Tabs (Fexofenadine hcl) .Marland Kitchen.. 1 tab by mouth daily 15)  Bayer Contour Test Strp (Glucose blood) .... Patient tests once daily 16)  Tramadol Hcl 50 Mg Tabs (Tramadol hcl) .Marland Kitchen.. 1 tab by mouth at bedtime as needed breakthru pain 17)  Morphine Sulfate 15 Mg Tabs (Morphine sulfate) .... Take one tablet every 6 hours as needed for pain 18)   Percocet 10-325 Mg Tabs (Oxycodone-acetaminophen) .Marland Kitchen.. 1-2 tablets every 4-6 hours as needed for pain 19)  Black Cohosh Hot Flash Relief 40 Mg Caps (Black cohosh) 20)  Cyclobenzaprine Hcl 10 Mg Tabs (Cyclobenzaprine hcl) .Marland Kitchen.. 1 tab by mouth at bedtime as needed muscle spasm  Patient Instructions: 1)  Referral Appointment Information 2)  Day/Date: 3)  Time: 4)  Place/MD: 5)  Address: 6)  Phone/Fax: 7)  Patient given appointment information. Information/Orders faxed/mailed.   8)  Continue celebrex.Marland Kitchenokay to occ take 2 tabs by mouth daily.  9)  Limit pain medicaiton. 10)  Black cohosh for hot flashes. 11)  Please schedule a follow-up appointment in 1 month 30 min OV.  Prescriptions: CYCLOBENZAPRINE HCL 10 MG TABS (CYCLOBENZAPRINE HCL) 1 tab by mouth at bedtime as needed muscle spasm  #30 x 0   Entered and Authorized by:   [name] MD   Signed by:   [name] MD on 12/25/2009   Method used:   Print then Give to Patient   RxID:   [id number] PERCOCET 10-325 MG TABS (OXYCODONE-ACETAMINOPHEN) 1-2 tablets every 4-6 hours as needed for pain  #30 x 0   Entered and Authorized by:   [name] MD   Signed by:   [name] MD on 12/25/2009   Method used:   Print then Give to Patient   RxID:   [id number]   Current Allergies (reviewed today): ! SULFA

## 2010-10-29 NOTE — Assessment & Plan Note (Signed)
Summary: cough, weezing, headache   Vital Signs:  Patient profile:   60 year old female Height:      63.5 inches Weight:      364.50 pounds BMI:     63.79 O2 Sat:      96 % on Room air Temp:     98 degrees F oral Pulse rate:   80 / minute Resp:     20 per minute BP sitting:   124 / 78  (left arm) Cuff size:   large  Vitals Entered By: Lewanda Rife LPN (March 15, 2010 9:31 AM)  O2 Flow:  Room air  CC: cough, wheezing and h/a and rt earache   History of Present Illness: 60 yo here for follow up urgent care visit.  Started having URI symptoms, including runny nose, ear pressure, wheezing and productive cough last Weds.  Went to urgent care on Monday, given nebulizer treatment, Ventolin inhaler and Zpack pack. Has one day left of her Zpack. Using the ventolin inhaler every 4-6 hours. Still wheezing and cough keeping her up at night. No fevers, chills, CP or other symptoms.  Does have h/o asthma and chronic bronchitis.  Current Medications (verified): 1)  Adult Aspirin Ec Low Strength 81 Mg  Tbec (Aspirin) .... Take 1 Tablet By Mouth Once A Day 2)  Oxcarbazepine 300 Mg  Tabs (Oxcarbazepine) .... Take Tablet By Mouth in The Morning  and 2 Tablets By Mouth Each Evening 3)  Levothyroxine Sodium 200 Mcg  Tabs (Levothyroxine Sodium) .... Take 1 Tablet By Mouth Once A Day 4)  Endocet 7.5-325 Mg  Tabs (Oxycodone-Acetaminophen) .... Take 1 By Mouth Every 8 Hours As Needed 5)  Furosemide 80 Mg  Tabs (Furosemide) .... Take 1/2 Tab By Mouth Two Times A Day As Needed 6)  Albuterol 90 Mcg/act  Aers (Albuterol) .... 2 Puff Inh As Needed 7)  Simvastatin 40 Mg  Tabs (Simvastatin) .... Take 1 Tablet By Mouth Once A Day 8)  Metformin Hcl 500 Mg  Tb24 (Metformin Hcl) .... Extended Release Take 2 Tablet By Mouth Once A Day 9)  Advair Hfa 115-21 Mcg/act  Aero (Fluticasone-Salmeterol) .... 2 Puff Once Daily 10)  Fish Oil Concentrate 300 Mg  Caps (Omega-3 Fatty Acids) .... Take 1 Capsule By Mouth Once A  Day 11)  Singulair 10 Mg  Tabs (Montelukast Sodium) .Marland Kitchen.. 1 Tab By Mouth Qhs 12)  Celebrex 200 Mg Caps (Celecoxib) .... Take 1 Capsule By Mouth Once A Day or Up To Twice A Day 13)  Lamotrigine 200 Mg Tabs (Lamotrigine) .... Once A Day 14)  Allegra 180 Mg Tabs (Fexofenadine Hcl) .Marland Kitchen.. 1 Tab By Mouth Daily As Needed 15)  Bayer Contour Test  Strp (Glucose Blood) .... Patient Tests Once Daily 16)  Tramadol Hcl 50 Mg Tabs (Tramadol Hcl) .Marland Kitchen.. 1 Tab By Mouth At Bedtime As Needed Breakthru Pain 17)  Morphine Sulfate 15 Mg Tabs (Morphine Sulfate) .... Take One Tablet Every 6 Hours As Needed For Pain 18)  Percocet 10-325 Mg Tabs (Oxycodone-Acetaminophen) .Marland Kitchen.. 1-2 Tablets Every 4-6 Hours As Needed For Pain 19)  Black Cohosh Hot Flash Relief 40 Mg Caps (Black Cohosh) .... As Needed 20)  Cyclobenzaprine Hcl 10 Mg Tabs (Cyclobenzaprine Hcl) .Marland Kitchen.. 1 Tab By Mouth At Bedtime As Needed Muscle Spasm 21)  Mucinex 600 Mg Xr12h-Tab (Guaifenesin) .... Otc As Directed. 22)  Zithromax Z-Pak 250 Mg Tabs (Azithromycin) .... Take As Directed 23)  Ventolin Hfa 108 (90 Base) Mcg/act Aers (Albuterol Sulfate) .Marland KitchenMarland KitchenMarland Kitchen  Two Puffs Every 4-6 Hours As Needed 24)  Prednisone 20 Mg Tabs (Prednisone) .... 3 Tab By Mouth X 2 Days, 2 Tabs By Mouth X 2 Days, Then 1 Tab By Mouth X 1 Day 25)  Cheratussin Ac 100-10 Mg/36ml Syrp (Guaifenesin-Codeine) .... 5 Ml At Bedtime As Needed Cough  Allergies: 1)  ! Sulfa  Past History:  Past Medical History: Last updated: Aug 04, 2007 Anemia-NOS Osteoarthritis Asthma, moderate pesistant Diabetes mellitus, type II Hypothyroidism GERD Allergic rhinitis  Past Surgical History: Last updated: 08/04/07 L hip replacement 2002 hysterectomy, partial abdominal 1982 for mennorhagia, no cervix left foot surgery bone spur 1982 neck surgery, herniated disc C2,3,4 TMJ surgery 1982 hosp for bipolar 10 years ago  Family History: Last updated: 2007/08/04 father died age 31 aneyrsym, alcoholism mother died  age 12 MVA 5 siblings: CAD, MI in 73s, late 61s Family History of CAD Female 1st degree relative <60 Family History of CAD Female 1st degree relative <50  Social History: Last updated: Aug 04, 2007 Occupation:accounting in past but currently unemployed Married 3 children Current Smoker 20 pack year history Alcohol use-yes Drug use-no Regular exercise-no  Risk Factors: Exercise: yes (09/15/2007)  Risk Factors: Smoking Status: current (04-Aug-2007)  Review of Systems      See HPI General:  Complains of malaise; denies chills and fever. CV:  Denies chest pain or discomfort. Resp:  Complains of cough, sputum productive, and wheezing; denies shortness of breath. GI:  Denies nausea and vomiting.  Physical Exam  General:  morbidly obese female iN AND Eyes:  No corneal or conjunctival inflammation noted. EOMI. Perrla. Funduscopic exam benign, without hemorrhages, exudates or papilledema. Vision grossly normal. Ears:  clear fluid B TMS Nose:  External nasal examination shows no deformity or inflammation. Nasal mucosa are pink and moist without lesions or exudates. Mouth:  MMM Lungs:  Normal respiratory effort, chest expands symmetrically. Exp wheezes at bases, no increased WOB. Heart:  Normal rate and regular rhythm. S1 and S2 normal without gallop, murmur, click, rub or other extra sounds. Extremities:  no edema Skin:  Healing subcutaneous cyst right lower back, no suspicious reasons. Psych:  Cognition and judgment appear intact. Alert and cooperative with normal attention span and concentration. No apparent delusions, illusions, hallucinations   Impression & Recommendations:  Problem # 1:  ASTHMA (ICD-493.90) Assessment Deteriorated Still wheezing. Given IM decadron in office today along with 5 day prednisone taper. Finish Zpack, continue ventolin inhaler as needed. See pt instructions for details. Her updated medication list for this problem includes:    Albuterol 90 Mcg/act  Aers (Albuterol) .Marland Kitchen... 2 puff inh as needed    Advair Hfa 115-21 Mcg/act Aero (Fluticasone-salmeterol) .Marland Kitchen... 2 puff once daily    Singulair 10 Mg Tabs (Montelukast sodium) .Marland Kitchen... 1 tab by mouth qhs    Ventolin Hfa 108 (90 Base) Mcg/act Aers (Albuterol sulfate) .Marland Kitchen..Marland Kitchen Two puffs every 4-6 hours as needed    Prednisone 20 Mg Tabs (Prednisone) .Marland KitchenMarland KitchenMarland KitchenMarland Kitchen 3 tab by mouth x 2 days, 2 tabs by mouth x 2 days, then 1 tab by mouth x 1 day  Complete Medication List: 1)  Adult Aspirin Ec Low Strength 81 Mg Tbec (Aspirin) .... Take 1 tablet by mouth once a day 2)  Oxcarbazepine 300 Mg Tabs (Oxcarbazepine) .... Take tablet by mouth in the morning  and 2 tablets by mouth each evening 3)  Levothyroxine Sodium 200 Mcg Tabs (Levothyroxine sodium) .... Take 1 tablet by mouth once a day 4)  Endocet 7.5-325 Mg Tabs (Oxycodone-acetaminophen) .... Take  1 by mouth every 8 hours as needed 5)  Furosemide 80 Mg Tabs (Furosemide) .... Take 1/2 tab by mouth two times a day as needed 6)  Albuterol 90 Mcg/act Aers (Albuterol) .... 2 puff inh as needed 7)  Simvastatin 40 Mg Tabs (Simvastatin) .... Take 1 tablet by mouth once a day 8)  Metformin Hcl 500 Mg Tb24 (Metformin hcl) .... Extended release take 2 tablet by mouth once a day 9)  Advair Hfa 115-21 Mcg/act Aero (Fluticasone-salmeterol) .... 2 puff once daily 10)  Fish Oil Concentrate 300 Mg Caps (Omega-3 fatty acids) .... Take 1 capsule by mouth once a day 11)  Singulair 10 Mg Tabs (Montelukast sodium) .Marland Kitchen.. 1 tab by mouth qhs 12)  Celebrex 200 Mg Caps (Celecoxib) .... Take 1 capsule by mouth once a day or up to twice a day 13)  Lamotrigine 200 Mg Tabs (Lamotrigine) .... Once a day 14)  Allegra 180 Mg Tabs (Fexofenadine hcl) .Marland Kitchen.. 1 tab by mouth daily as needed 15)  Bayer Contour Test Strp (Glucose blood) .... Patient tests once daily 16)  Tramadol Hcl 50 Mg Tabs (Tramadol hcl) .Marland Kitchen.. 1 tab by mouth at bedtime as needed breakthru pain 17)  Morphine Sulfate 15 Mg Tabs (Morphine  sulfate) .... Take one tablet every 6 hours as needed for pain 18)  Percocet 10-325 Mg Tabs (Oxycodone-acetaminophen) .Marland Kitchen.. 1-2 tablets every 4-6 hours as needed for pain 19)  Black Cohosh Hot Flash Relief 40 Mg Caps (Black cohosh) .... As needed 20)  Cyclobenzaprine Hcl 10 Mg Tabs (Cyclobenzaprine hcl) .Marland Kitchen.. 1 tab by mouth at bedtime as needed muscle spasm 21)  Mucinex 600 Mg Xr12h-tab (Guaifenesin) .... Otc as directed. 22)  Zithromax Z-pak 250 Mg Tabs (Azithromycin) .... Take as directed 23)  Ventolin Hfa 108 (90 Base) Mcg/act Aers (Albuterol sulfate) .... Two puffs every 4-6 hours as needed 24)  Prednisone 20 Mg Tabs (Prednisone) .... 3 tab by mouth x 2 days, 2 tabs by mouth x 2 days, then 1 tab by mouth x 1 day 25)  Cheratussin Ac 100-10 Mg/62ml Syrp (Guaifenesin-codeine) .... 5 ml at bedtime as needed cough  Patient Instructions: 1)  Great to see you. 2)  Please finish your Zpack as prescribed by urgent care and use your albuterol as needed. 3)  Call us Monday and let us know how you are feeling. 4)  Take prednisone as directed. 5)  Cough suppressant at night as needed. Prescriptions: CHERATUSSIN AC 100-10 MG/5ML SYRP (GUAIFENESIN-CODEINE) 5 ml at bedtime as needed cough  #4 ounces x 0   Entered and Authorized by:   Ruthe Mannan MD   Signed by:   Ruthe Mannan MD on 03/15/2010   Method used:   Printed then faxed to ...       [facility]  [address] [id number] (retail)       [address]       [place], [place]  [address]       Ph: [phone] or [phone]       Fax: [phone]   RxID:   [id number] PREDNISONE 20 MG TABS (PREDNISONE) 3 tab by mouth x 2 days, 2 tabs by mouth x 2 days, then 1 tab by mouth x 1 day  #15 x 0   Entered and Authorized by:   Ruthe Mannan MD   Signed by:   Ruthe Mannan MD on 03/15/2010   Method used:   Electronically to        [facility]  [address] [phone][facility] (retail)  [address]       [place], [place]  [address]       Ph: [phone] or [phone]        Fax: [phone]   RxID:   [id number]   Current Allergies (reviewed today): ! SULFA  Appended Document: cough, weezing, headache    Clinical Lists Changes  Orders: Added new Service order of Dexamethasone Sodium Phosphate 1mg  (J1100) - Signed Added new Service order of Admin of Therapeutic Inj  intramuscular or subcutaneous (27253) - Signed       Medication Administration  Injection # 1:    Medication: Dexamethasone Sodium Phosphate 1mg     Diagnosis: ASTHMA, WITH ACUTE EXACERBATION (GUY-403.47)    Route: IM    Site: R deltoid    Exp Date: 09/29/2010    Lot #: [id number]    Mfr: Baxter    Comments: Pt received 10mg  IM    Patient tolerated injection without complications    Given by: Lewanda Rife LPN (March 15, 2010 10:13 AM)  Orders Added: 1)  Dexamethasone Sodium Phosphate 1mg  [J1100] 2)  Admin of Therapeutic Inj  intramuscular or subcutaneous [38756]

## 2010-10-29 NOTE — Assessment & Plan Note (Signed)
Summary: 1 M F/U 30 MIN/DLO   Vital Signs:  Patient profile:   60 year old female Height:      63.5 inches Weight:      357.04 pounds BMI:     62.48 Temp:     98.0 degrees F oral Pulse rate:   68 / minute Pulse rhythm:   regular BP sitting:   120 / 70  (left arm) Cuff size:   large  Vitals Entered By: Benny Lennert CMA Duncan Dull) (June 25, 2010 9:28 AM)  History of Present Illness: Chief complaint 1 month follow up   DM,   now on victoza x 1 month. Had been on steroid for infection/asthma. On metformin XR 2 tabs once daily. In last month she has lost 10 lbs with diet and lifestyle changes.  previous fasting CBGs...280.Marland KitchenMarland KitchenMarland Kitchennow 95-126 Victoza makes her full earlier.     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) 10)  Benign Positional Vertigo  (ICD-386.11) 11)  Urinary Incontinence, Mixed  (ICD-788.33) 12)  Screening For Mlig Neop, Breast, Nos  (ICD-V76.10) 13)  Hypercholesterolemia  (ICD-272.0) 14)  Family History of Cad Female 1st Degree Relative <50  (ICD-V17.3) 15)  Family History of Cad Female 1st Degree Relative <60  (ICD-V16.49) 16)  Allergic Rhinitis  (ICD-477.9) 17)  Sleep Apnea  (ICD-780.57) 18)  Gerd  (ICD-530.81) 19)  Hypothyroidism  (ICD-244.9) 20)  Bipolar Affective Disorder, Depressed  (ICD-296.50) 21)  Diabetes Mellitus, Type II  (ICD-250.00) 22)  Asthma  (ICD-493.90) 23)  Osteoarthritis  (ICD-715.90) 24)  Anemia-nos  (ICD-285.9)  Current Medications (verified): 1)  Adult Aspirin Ec Low Strength 81 Mg  Tbec (Aspirin)  .... Take 1 Tablet By Mouth Once A Day 2)  Oxcarbazepine 300 Mg  Tabs (Oxcarbazepine) .... Take Tablet By Mouth in The Morning  and 2 Tablets By Mouth Each Evening 3)  Levothyroxine Sodium 200 Mcg  Tabs (Levothyroxine Sodium) .... Take 1 Tablet By Mouth Once A Day 4)  Furosemide 80 Mg  Tabs (Furosemide) .... Take 1/2 Tab By Mouth Two Times A Day As Needed 5)  Albuterol 90 Mcg/act  Aers (Albuterol) .... 2 Puff Inh As Needed 6)  Simvastatin 40 Mg  Tabs (Simvastatin) .... Take 1 Tablet By Mouth Once A Day 7)  Metformin Hcl 500 Mg  Tb24 (Metformin Hcl) .... Extended Release Take 2 Tablet By Mouth Once A Day 8)  Advair Hfa 115-21 Mcg/act  Aero (Fluticasone-Salmeterol) .... 2 Puff Once Daily 9)  Fish Oil Concentrate 300 Mg  Caps (Omega-3 Fatty Acids) .... Take 1 Capsule By Mouth Once A Day 10)  Singulair 10 Mg  Tabs (Montelukast Sodium) .Marland Kitchen.. 1 Tab By Mouth Qhs 11)  Celebrex 200 Mg Caps (Celecoxib) .... Take 1 Capsule By Mouth Once A Day or Up To Twice A Day 12)  Lamotrigine 200 Mg Tabs (Lamotrigine) .... Once A Day 13)  Allegra 180 Mg Tabs (Fexofenadine Hcl) .Marland Kitchen.. 1 Tab By Mouth Daily As Needed 14)  Percocet 10-325 Mg Tabs (Oxycodone-Acetaminophen) .Marland Kitchen.. 1-2 Tablets Every 4-6 Hours As Needed For Pain 15)  Mucinex 600  Mg Xr12h-Tab (Guaifenesin) .... Otc As Directed. 16)  Victoza 18 Mg/74ml Soln (Liraglutide) .... 0.6 Mg  Subcutaneously Daily X 1 Week Then Increase To 1.2 Mg Daily 17)  Losartan Potassium 50 Mg Tabs (Losartan Potassium) .Marland Kitchen.. 1 Tab By Mouth Daily  Allergies: 1)  ! Sulfa  Past History:  Past medical, surgical, family and social histories (including risk factors) reviewed, and no changes noted (except as noted below).  Past Medical History: Reviewed history from 07/30/2007 and no changes required. Anemia-NOS Osteoarthritis Asthma, moderate pesistant Diabetes mellitus, type II Hypothyroidism GERD Allergic rhinitis  Past Surgical History: Reviewed history from 07/30/2007 and no  changes required. L hip replacement 2002 hysterectomy, partial abdominal 1982 for mennorhagia, no cervix left foot surgery bone spur 1982 neck surgery, herniated disc C2,3,4 TMJ surgery 1982 hosp for bipolar 10 years ago  Family History: Reviewed history from 07/30/2007 and no changes required. father died age 6 aneyrsym, alcoholism mother died age 9 MVA 5 siblings: CAD, MI in 52s, late 63s Family History of CAD Female 1st degree relative <60 Family History of CAD Female 1st degree relative <50  Social History: Reviewed history from 07/30/2007 and no changes required. 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 CV:  Denies chest pain or discomfort. Resp:  Denies shortness of breath. GI:  Denies abdominal pain. GU:  Denies dysuria. MS:  Right hip pain, contoinues to be intermittant but not too bothersome. ...sees Dr. Glenice Laine in [place] [phone].Marland Kitchen  Physical Exam  General:  morbidly obese female in NAd Mouth:  Oral mucosa and oropharynx without lesions or exudates.  Teeth in good repair. Neck:  no carotid bruit or thyromegaly no cervical or supraclavicular lymphadenopathy  Lungs:  Normal respiratory effort, chest expands symmetrically. Lungs are clear to auscultation, no crackles or wheezes. Heart:  Normal rate and regular rhythm. S1 and S2 normal without gallop, murmur, click, rub or other extra sounds. Abdomen:  Bowel sounds positive,abdomen soft and non-tender without masses, organomegaly or hernias noted. Pulses:  R and L posterior tibial pulses are full and equal bilaterally  Extremities:  no edema Psych:  Cognition and judgment appear intact. Alert and cooperative with normal attention span and concentration. No apparent delusions, illusions, hallucinations   Impression &  Recommendations:  Problem # 1:  HYPERTENSION (ICD-401.9)  Improved control on losartan...check Cr today. Her updated medication list for this problem includes:    Furosemide 80 Mg Tabs (Furosemide) .Marland Kitchen... Take 1/2 tab by mouth two times a day as needed    Losartan Potassium 50 Mg Tabs (Losartan potassium) .Marland Kitchen... 1 tab by mouth daily  Orders: TLB-BMP (Basic Metabolic Panel-BMET) (80048-METABOL)  BP today: 120/70 Prior BP: 130/90 (05/17/2010)  Prior 10 Yr Risk Heart Disease: 6 % (01/29/2010)  Labs Reviewed: K+: 4.9 (01/22/2010) Creat: : 0.6 (01/22/2010)   Chol: 132 (01/22/2010)   HDL: 60.00 (01/22/2010)   LDL: 64 (01/22/2010)   TG: 40.0 (01/22/2010)  Problem # 2:  DIABETES MELLITUS, TYPE II (ICD-250.00) Blood sugars much better controlled fasting...check an occasional 2 hour postprandial. Encouraged exercise, weight loss, healthy eating habits.  Great job with weight loss 12 lb in 1 month...Marland KitchenContinue victoza current dose..will hold on increase given mild nausea with victoza. If weight loss slow, or CBGs increase...consider increasing to 1.8 micrograms.  Her updated medication list for this problem includes:    Adult Aspirin Ec Low  Strength 81 Mg Tbec (Aspirin) .Marland Kitchen... Take 1 tablet by mouth once a day    Metformin Hcl 500 Mg Tb24 (Metformin hcl) ..... Extended release take 2 tablet by mouth once a day    Victoza 18 Mg/3ml Soln (Liraglutide) .Marland Kitchen... 0.6 mg  subcutaneously daily x 1 week then increase to 1.2 mg daily    Losartan Potassium 50 Mg Tabs (Losartan potassium) .Marland Kitchen... 1 tab by mouth daily  Complete Medication List: 1)  Adult Aspirin Ec Low Strength 81 Mg Tbec (Aspirin) .... Take 1 tablet by mouth once a day 2)  Oxcarbazepine 300 Mg Tabs (Oxcarbazepine) .... Take tablet by mouth in the morning  and 2 tablets by mouth each evening 3)  Levothyroxine Sodium 200 Mcg Tabs (Levothyroxine sodium) .... Take 1 tablet by mouth once a day 4)  Furosemide 80 Mg Tabs (Furosemide) .... Take 1/2 tab  by mouth two times a day as needed 5)  Albuterol 90 Mcg/act Aers (Albuterol) .... 2 puff inh as needed 6)  Simvastatin 40 Mg Tabs (Simvastatin) .... Take 1 tablet by mouth once a day 7)  Metformin Hcl 500 Mg Tb24 (Metformin hcl) .... Extended release take 2 tablet by mouth once a day 8)  Advair Hfa 115-21 Mcg/act Aero (Fluticasone-salmeterol) .... 2 puff once daily 9)  Fish Oil Concentrate 300 Mg Caps (Omega-3 fatty acids) .... Take 1 capsule by mouth once a day 10)  Singulair 10 Mg Tabs (Montelukast sodium) .Marland Kitchen.. 1 tab by mouth qhs 11)  Celebrex 200 Mg Caps (Celecoxib) .... Take 1 capsule by mouth once a day or up to twice a day 12)  Lamotrigine 200 Mg Tabs (Lamotrigine) .... Once a day 13)  Allegra 180 Mg Tabs (Fexofenadine hcl) .Marland Kitchen.. 1 tab by mouth daily as needed 14)  Percocet 10-325 Mg Tabs (Oxycodone-acetaminophen) .Marland Kitchen.. 1-2 tablets every 4-6 hours as needed for pain 15)  Mucinex 600 Mg Xr12h-tab (Guaifenesin) .... Otc as directed. 16)  Victoza 18 Mg/88ml Soln (Liraglutide) .... 0.6 mg  subcutaneously daily x 1 week then increase to 1.2 mg daily 17)  Losartan Potassium 50 Mg Tabs (Losartan potassium) .Marland Kitchen.. 1 tab by mouth daily  Other Orders: Admin 1st Vaccine (54098) Flu Vaccine 21yrs + (11914)  Patient Instructions: 1)  Continue on current dose of losartan and victoza. 2)   Con tinue work on exercise and weight loss. 3)  Please schedule a follow-up appointment in 2 months DM 4)  BMP prior to visit, ICD-9:  5)  Hepatic Panel prior to visit ICD-9:  6)  Lipid panel prior to visit ICD-9 :  7)  HgBA1c prior to visit  ICD-9:  8)  Urine Microalbumin prior to visit ICD-9 :   Current Allergies (reviewed today): ! SULFA                   Flu Vaccine Consent Questions     Do you have a history of severe allergic reactions to this vaccine? no    Any prior history of allergic reactions to egg and/or gelatin? no    Do you have a sensitivity to the preservative Thimersol? no     Do you have a past history of Guillan-Barre Syndrome? no    Do you currently have an acute febrile illness? no    Have you ever had a severe reaction to latex? no    Vaccine information given and explained to patient? yes    Are you currently pregnant? no    Lot Number:[id number]  Exp Date:03/29/2011   Site Given  Left Deltoid IMlbflu

## 2010-10-31 NOTE — Assessment & Plan Note (Signed)
Summary: right shoulder pain/rbh   Vital Signs:  Patient profile:   60 year old female Height:      63.5 inches Weight:      370.50 pounds BMI:     64.84 Temp:     97.7 degrees F oral Pulse rate:   68 / minute Pulse rhythm:   regular BP sitting:   120 / 72  (left arm) Cuff size:   large  Vitals Entered By: Benny Lennert CMA Duncan Dull) (October 22, 2010 8:43 AM)  History of Present Illness: Chief complaint Right shoulder pain   10/2009...began having left neck, shoulder and arm pain. Radiated to arm. Numbness in hand. Seen at Urgent Care..given pain medication..minimal help. Saw old primary MD in [place].Marland Kitchengiven prednisone, pain med and muscle relaxant. Minimal improvement Referred to Spine ortho: Dr. Glenice Laine.  X-rays and MRI showed C3-C4 disc herniation near previous fusion. Hx of neck fusion in C2,3,4. Referred  for pain MD ... Dr. Trenton Gammon PMR in [place]. Set up with PT. and MRI shoulder.. no clear abnormality. Resolved with traction and steroid injection in neck. Does not have ortho HERE. Did have injection with Dr. Metta Clines last year..in cervical spine.  Now in last week... Neck tense, tender, neck popping.. Now right pain and in back of shoulder. Radiates down upper arm. No weakness or numbness in hand.  Worse with trying to brush hair, moving neck side to side.   On celebrex.. used percocet off and on... helped temporarily.    Right trigger finger.. 3rd digit... pain in palm .. get stuck in place. Cannot open bottles.      Problems Prior to Update: 1)  Hypertension  (ICD-401.9) 2)  Hip Pain  (ICD-719.45) 3)  Cervical Radiculopathy, Left  (ICD-723.4) 4)  Preventive Health Care  (ICD-V70.0) 5)  Routine Gynecological Examination  (ICD-V72.31) 6)  Asthma, With Acute Exacerbation  (ICD-493.92) 7)  Diabetic Peripheral Neuropathy  (ICD-250.60) 8)  Benign Positional Vertigo  (ICD-386.11) 9)  Urinary Incontinence, Mixed  (ICD-788.33) 10)  Screening For Mlig Neop,  Breast, Nos  (ICD-V76.10) 11)  Hypercholesterolemia  (ICD-272.0) 12)  Family History of Cad Female 1st Degree Relative <50  (ICD-V17.3) 13)  Family History of Cad Female 1st Degree Relative <60  (ICD-V16.49) 14)  Allergic Rhinitis  (ICD-477.9) 15)  Sleep Apnea  (ICD-780.57) 16)  Gerd  (ICD-530.81) 17)  Hypothyroidism  (ICD-244.9) 18)  Bipolar Affective Disorder, Depressed  (ICD-296.50) 19)  Diabetes Mellitus, Type II  (ICD-250.00) 20)  Asthma  (ICD-493.90) 21)  Osteoarthritis  (ICD-715.90) 22)  Anemia-nos  (ICD-285.9)  Current Medications (verified): 1)  Adult Aspirin Ec Low Strength 81 Mg  Tbec (Aspirin) .... Take 1 Tablet By Mouth Once A Day 2)  Oxcarbazepine 300 Mg  Tabs (Oxcarbazepine) .... Take Tablet By Mouth in The Morning  and 2 Tablets By Mouth Each Evening 3)  Levothyroxine Sodium 200 Mcg  Tabs (Levothyroxine Sodium) .... Take 1 Tablet By Mouth Once A Day 4)  Furosemide 80 Mg  Tabs (Furosemide) .... Take 1/2 Tab By Mouth Two Times A Day As Needed 5)  Albuterol 90 Mcg/act  Aers (Albuterol) .... 2 Puff Inh As Needed 6)  Simvastatin 40 Mg  Tabs (Simvastatin) .... Take 1 Tablet By Mouth Once A Day 7)  Metformin Hcl 500 Mg  Tb24 (Metformin Hcl) .... Extended Release Take 2 Tablet By Mouth Once A Day 8)  Advair Hfa 115-21 Mcg/act  Aero (Fluticasone-Salmeterol) .... 2 Puff Once Daily 9)  Fish Oil Concentrate 300 Mg  Caps (  Omega-3 Fatty Acids) .... Take 1 Capsule By Mouth Once A Day 10)  Singulair 10 Mg  Tabs (Montelukast Sodium) .Marland Kitchen.. 1 Tab By Mouth Qhs 11)  Celebrex 200 Mg Caps (Celecoxib) .... Take 1 Capsule By Mouth Once A Day or Up To Twice A Day 12)  Lamotrigine 200 Mg Tabs (Lamotrigine) .... Once A Day 13)  Allegra 180 Mg Tabs (Fexofenadine Hcl) .Marland Kitchen.. 1 Tab By Mouth Daily As Needed 14)  Percocet 10-325 Mg Tabs (Oxycodone-Acetaminophen) .Marland Kitchen.. 1-2 Tablets Every 4-6 Hours As Needed For Pain 15)  Mucinex 600 Mg Xr12h-Tab (Guaifenesin) .... Otc As Directed. 16)  Victoza 18 Mg/39ml Soln  (Liraglutide) .... 0.6 Mg  Subcutaneously Daily X 1 Week Then Increase To 1.2 Mg Daily 17)  Losartan Potassium 50 Mg Tabs (Losartan Potassium) .Marland Kitchen.. 1 Tab By Mouth Daily  Allergies: 1)  ! Sulfa  Past History:  Past medical, surgical, family and social histories (including risk factors) reviewed, and no changes noted (except as noted below).  Past Medical History: Reviewed history from 07/30/2007 and no changes required. Anemia-NOS Osteoarthritis Asthma, moderate pesistant Diabetes mellitus, type II Hypothyroidism GERD Allergic rhinitis  Past Surgical History: Reviewed history from 07/30/2007 and no changes required. L hip replacement 2002 hysterectomy, partial abdominal 1982 for mennorhagia, no cervix left foot surgery bone spur 1982 neck surgery, herniated disc C2,3,4 TMJ surgery 1982 hosp for bipolar 10 years ago  Family History: Reviewed history from 07/30/2007 and no changes required. father died age 8 aneyrsym, alcoholism mother died age 52 MVA 5 siblings: CAD, MI in 25s, late 71s Family History of CAD Female 1st degree relative <60 Family History of CAD Female 1st degree relative <50  Social History: Reviewed history from 07/30/2007 and no changes required. 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 CV:  Denies chest pain or discomfort. Resp:  Denies shortness of breath. GI:  Denies abdominal pain. Neuro:  Denies numbness.  Physical Exam  General:  obese female inNAD  Eyes:  No corneal or conjunctival inflammation noted. EOMI. Perrla.  Vision grossly normal. Mouth:  MMM Neck:  no carotid bruit or thyromegaly no cervical or supraclavicular lymphadenopathy  Limited ROM due to pain and past fusion Lungs:  Normal respiratory effort, chest expands symmetrically. Lungs are clear to  auscultation, no crackles or wheezes. Heart:  Normal rate and regular rhythm. S1 and S2 normal without gallop, murmur, click, rub or other extra sounds. Msk:  ttp over R trapezious and upper back, focal ttp in cervical vertebrae.  neg impingement in B shoulders neg drop arm ad Neers  Difficulty doing spurling test due to past fusion.. but pain caused with attempt.  Extremities:  Right middle finger triggerring and pain over nodule at base of digit Neurologic:  No cranial nerve deficits noted. Station and gait are normal. Plantar reflexes are down-going bilaterally. DTRs are symmetrical throughout. Sensory, motor and coordinative functions appear intact.   Impression & Recommendations:  Problem # 1:  CERVICAL RADICULOPATHY, RIGHT (ICD-723.4) Not clearly shoulder pathology. Films (plain and MRI) done 1 year ago.. will review.  On celebrex, percocet for breakthrough pain. Home stretching, heat.  Will refer back to Dr. Metta Clines for  reeval and likely injection. She would benefit from traction/ PT... she would like to hold off in referral to PT until she sees Dr. Metta Clines. Orders: Pain Clinic Referral (Pain)  Problem #  2:  TRIGGER FINGER, RIGHT MIDDLE (ICD-727.03)  Refer for steroid injection of triggerfinger.  Orders: Orthopedic Surgeon Referral (Ortho Surgeon)  Complete Medication List: 1)  Adult Aspirin Ec Low Strength 81 Mg Tbec (Aspirin) .... Take 1 tablet by mouth once a day 2)  Oxcarbazepine 300 Mg Tabs (Oxcarbazepine) .... Take tablet by mouth in the morning  and 2 tablets by mouth each evening 3)  Levothyroxine Sodium 200 Mcg Tabs (Levothyroxine sodium) .... Take 1 tablet by mouth once a day 4)  Furosemide 80 Mg Tabs (Furosemide) .... Take 1/2 tab by mouth two times a day as needed 5)  Albuterol 90 Mcg/act Aers (Albuterol) .... 2 puff inh as needed 6)  Simvastatin 40 Mg Tabs (Simvastatin) .... Take 1 tablet by mouth once a day 7)  Metformin Hcl 500 Mg Tb24 (Metformin hcl) ....  Extended release take 2 tablet by mouth once a day 8)  Advair Hfa 115-21 Mcg/act Aero (Fluticasone-salmeterol) .... 2 puff once daily 9)  Fish Oil Concentrate 300 Mg Caps (Omega-3 fatty acids) .... Take 1 capsule by mouth once a day 10)  Singulair 10 Mg Tabs (Montelukast sodium) .Marland Kitchen.. 1 tab by mouth qhs 11)  Celebrex 200 Mg Caps (Celecoxib) .... Take 1 capsule by mouth once a day or up to twice a day 12)  Lamotrigine 200 Mg Tabs (Lamotrigine) .... Once a day 13)  Allegra 180 Mg Tabs (Fexofenadine hcl) .Marland Kitchen.. 1 tab by mouth daily as needed 14)  Percocet 10-325 Mg Tabs (Oxycodone-acetaminophen) .Marland Kitchen.. 1-2 tablets every 4-6 hours as needed for pain 15)  Mucinex 600 Mg Xr12h-tab (Guaifenesin) .... Otc as directed. 16)  Victoza 18 Mg/60ml Soln (Liraglutide) .... 0.6 mg  subcutaneously daily x 1 week then increase to 1.2 mg daily 17)  Losartan Potassium 50 Mg Tabs (Losartan potassium) .Marland Kitchen.. 1 tab by mouth daily  Patient Instructions: 1)   Heat on neck, stretching gentle. 2)  Celebrex daily and percocet for breakthrough pain.  3)  Referral Appointment Information 4)  Day/Date: 5)  Time: 6)  Place/MD: 7)  Address: 8)  Phone/Fax: 9)  Patient given appointment information. Information/Orders faxed/mailed.  Prescriptions: PERCOCET 10-325 MG TABS (OXYCODONE-ACETAMINOPHEN) 1-2 tablets every 4-6 hours as needed for pain  #30 x 0   Entered and Authorized by:   [name] MD   Signed by:   [name] MD on 10/22/2010   Method used:   Print then Give to Patient   RxID:   [id number] PERCOCET 10-325 MG TABS (OXYCODONE-ACETAMINOPHEN) 1-2 tablets every 4-6 hours as needed for pain  #30 x 0   Entered and Authorized by:   [name] MD   Signed by:   [name] MD on 10/22/2010   Method used:   Print then Give to Patient   RxID:   [id number]    Orders Added: 1)  Pain Clinic Referral [Pain] 2)  Orthopedic Surgeon Referral [Ortho Surgeon] 3)  Est. Patient Level IV [53664]    Current  Allergies (reviewed today): ! SULFA

## 2010-11-06 NOTE — Consult Note (Signed)
Summary: [facility]  [facility]   Imported By: Maryln Gottron 11/01/2010 14:40:08  _____________________________________________________________________  External Attachment:    Type:   Image     Comment:   External Document

## 2010-11-11 ENCOUNTER — Encounter: Payer: Self-pay | Admitting: Family Medicine

## 2010-11-11 ENCOUNTER — Ambulatory Visit: Payer: Self-pay | Admitting: Pain Medicine

## 2010-11-14 NOTE — Letter (Signed)
Summary: [facility]  [facility]   Imported By: Kassie Mends 11/06/2010 08:08:47  _____________________________________________________________________  External Attachment:    Type:   Image     Comment:   External Document

## 2010-11-26 NOTE — Letter (Signed)
Summary: [facility]   Imported By: Kassie Mends 11/20/2010 09:12:22  _____________________________________________________________________  External Attachment:    Type:   Image     Comment:   External Document

## 2010-11-27 ENCOUNTER — Ambulatory Visit: Payer: Self-pay | Admitting: Family Medicine

## 2010-11-27 ENCOUNTER — Encounter: Payer: Self-pay | Admitting: Family Medicine

## 2010-11-27 ENCOUNTER — Ambulatory Visit (INDEPENDENT_AMBULATORY_CARE_PROVIDER_SITE_OTHER): Payer: 59 | Admitting: Family Medicine

## 2010-11-27 DIAGNOSIS — J029 Acute pharyngitis, unspecified: Secondary | ICD-10-CM | POA: Insufficient documentation

## 2010-11-27 LAB — CONVERTED CEMR LAB: Rapid Strep: NEGATIVE

## 2010-12-03 ENCOUNTER — Other Ambulatory Visit (INDEPENDENT_AMBULATORY_CARE_PROVIDER_SITE_OTHER): Payer: 59

## 2010-12-03 ENCOUNTER — Encounter (INDEPENDENT_AMBULATORY_CARE_PROVIDER_SITE_OTHER): Payer: Self-pay | Admitting: *Deleted

## 2010-12-03 ENCOUNTER — Other Ambulatory Visit: Payer: Self-pay | Admitting: Family Medicine

## 2010-12-03 DIAGNOSIS — E119 Type 2 diabetes mellitus without complications: Secondary | ICD-10-CM

## 2010-12-03 LAB — HEMOGLOBIN A1C: Hgb A1c MFr Bld: 6.7 % — ABNORMAL HIGH (ref 4.6–6.5)

## 2010-12-05 NOTE — Assessment & Plan Note (Signed)
Summary: HEADACHES, SORE THROAT & CAN HARDLY SWALLOW, TEMPERATURE  / LFW   Vital Signs:  Patient profile:   60 year old female Height:      63.5 inches Weight:      360.75 pounds BMI:     63.13 Temp:     98.1 degrees F oral Pulse rate:   71 / minute Pulse rhythm:   regular BP sitting:   122 / 82  (left arm) Cuff size:   large  Vitals Entered By: Linde Gillis CMA Duncan Dull) (November 27, 2010 10:38 AM) CC: headache, sore throat, fever   History of Present Illness: 60 yo here for subjective fever, sore throat, headache x 2 days. Grand daughter diagnosed with strep throat last week.  She has had a little runny nose. No sinus pressure.  No wheezing, no increased WOB.  Current Medications (verified): 1)  Adult Aspirin Ec Low Strength 81 Mg  Tbec (Aspirin) .... Take 1 Tablet By Mouth Once A Day 2)  Oxcarbazepine 300 Mg  Tabs (Oxcarbazepine) .... Take Tablet By Mouth in The Morning  and 2 Tablets By Mouth Each Evening 3)  Levothyroxine Sodium 200 Mcg  Tabs (Levothyroxine Sodium) .... Take 1 Tablet By Mouth Once A Day 4)  Furosemide 80 Mg  Tabs (Furosemide) .... Take 1/2 Tab By Mouth Two Times A Day As Needed 5)  Albuterol 90 Mcg/act  Aers (Albuterol) .... 2 Puff Inh As Needed 6)  Simvastatin 40 Mg  Tabs (Simvastatin) .... Take 1 Tablet By Mouth Once A Day 7)  Metformin Hcl 500 Mg  Tb24 (Metformin Hcl) .... Extended Release Take 2 Tablet By Mouth Once A Day 8)  Advair Hfa 115-21 Mcg/act  Aero (Fluticasone-Salmeterol) .... 2 Puff Once Daily 9)  Fish Oil Concentrate 300 Mg  Caps (Omega-3 Fatty Acids) .... Take 1 Capsule By Mouth Once A Day 10)  Singulair 10 Mg  Tabs (Montelukast Sodium) .Marland Kitchen.. 1 Tab By Mouth Qhs 11)  Celebrex 200 Mg Caps (Celecoxib) .... Take 1 Capsule By Mouth Once A Day or Up To Twice A Day 12)  Lamotrigine 200 Mg Tabs (Lamotrigine) .... Once A Day 13)  Allegra 180 Mg Tabs (Fexofenadine Hcl) .Marland Kitchen.. 1 Tab By Mouth Daily As Needed 14)  Percocet 10-325 Mg Tabs  (Oxycodone-Acetaminophen) .Marland Kitchen.. 1-2 Tablets Every 4-6 Hours As Needed For Pain 15)  Mucinex 600 Mg Xr12h-Tab (Guaifenesin) .... Otc As Directed. 16)  Victoza 18 Mg/61ml Soln (Liraglutide) .... 0.6 Mg  Subcutaneously Daily X 1 Week Then Increase To 1.2 Mg Daily 17)  Losartan Potassium 50 Mg Tabs (Losartan Potassium) .Marland Kitchen.. 1 Tab By Mouth Daily  Allergies: 1)  ! Sulfa  Past History:  Past Medical History: Last updated: 08-02-2007 Anemia-NOS Osteoarthritis Asthma, moderate pesistant Diabetes mellitus, type II Hypothyroidism GERD Allergic rhinitis  Past Surgical History: Last updated: 02-Aug-2007 L hip replacement 2002 hysterectomy, partial abdominal 1982 for mennorhagia, no cervix left foot surgery bone spur 1982 neck surgery, herniated disc C2,3,4 TMJ surgery 1982 hosp for bipolar 10 years ago  Family History: Last updated: 08/02/2007 father died age 61 aneyrsym, alcoholism mother died age 59 MVA 5 siblings: CAD, MI in 62s, late 11s Family History of CAD Female 1st degree relative <60 Family History of CAD Female 1st degree relative <50  Social History: Last updated: 02-Aug-2007 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 ENT:  Complains of hoarseness, nasal congestion, postnasal drainage, sinus pressure, and sore throat. Resp:  Complains of cough; denies shortness of breath, sputum productive, and wheezing.  Physical Exam  General:  obese female in NAD  Ears:  External ear exam shows no significant lesions or deformities.  Otoscopic examination reveals clear canals, tympanic membranes are intact bilaterally without bulging, retraction, inflammation or discharge. Hearing is grossly normal  bilaterally. Nose:  External nasal examination shows no deformity or inflammation. Nasal mucosa are pink and moist without lesions or exudates. Mouth:  mild erythema Lungs:  Normal respiratory effort, chest expands symmetrically. Lungs are clear to auscultation, no crackles or wheezes. Heart:  Normal rate and regular rhythm. S1 and S2 normal without gallop, murmur, click, rub or other extra sounds. Extremities:  Right middle finger triggerring and pain over nodule at base of digit Skin:  Intact without suspicious lesions or rashes Cervical Nodes:  No lymphadenopathy noted Psych:  Cognition and judgment appear intact. Alert and cooperative with normal attention span and concentration. No apparent delusions, illusions, hallucinations   Impression & Recommendations:  Problem # 1:  ACUTE PHARYNGITIS (ICD-462) Assessment New Rapid strep negative. Likely viral.  Continue Ibuprofen as needed for fever and comfort. Her updated medication list for this problem includes:    Adult Aspirin Ec Low Strength 81 Mg Tbec (Aspirin) .Marland Kitchen... Take 1 tablet by mouth once a day    Celebrex 200 Mg Caps (Celecoxib) .Marland Kitchen... Take 1 capsule by mouth once a day or up to twice a day  Complete Medication List: 1)  Adult Aspirin Ec Low Strength 81 Mg Tbec (Aspirin) .... Take 1 tablet by mouth once a day 2)  Oxcarbazepine 300 Mg Tabs (Oxcarbazepine) .... Take tablet by mouth in the morning  and 2 tablets by mouth each evening 3)  Levothyroxine Sodium 200 Mcg Tabs (Levothyroxine sodium) .... Take 1 tablet by mouth once a day 4)  Furosemide 80 Mg Tabs (Furosemide) .... Take 1/2 tab by mouth two times a day as needed 5)  Albuterol 90 Mcg/act Aers (Albuterol) .... 2 puff inh as needed 6)  Simvastatin 40 Mg Tabs (Simvastatin) .... Take 1 tablet by mouth once a day 7)  Metformin Hcl 500 Mg Tb24 (Metformin hcl) .... Extended release take 2 tablet by mouth once a day 8)  Advair Hfa 115-21 Mcg/act Aero (Fluticasone-salmeterol) ....  2 puff once daily 9)  Fish Oil Concentrate 300 Mg Caps (Omega-3 fatty acids) .... Take 1 capsule by mouth once a day 10)  Singulair 10 Mg Tabs (Montelukast sodium) .Marland Kitchen.. 1 tab by mouth qhs 11)  Celebrex 200 Mg Caps (Celecoxib) .... Take 1 capsule by mouth once a day or up to twice a day 12)  Lamotrigine 200 Mg Tabs (Lamotrigine) .... Once a day 13)  Allegra 180 Mg Tabs (Fexofenadine hcl) .Marland Kitchen.. 1 tab by mouth daily as needed 14)  Percocet 10-325 Mg Tabs (Oxycodone-acetaminophen) .Marland Kitchen.. 1-2 tablets every 4-6 hours as needed for pain 15)  Mucinex 600 Mg Xr12h-tab (Guaifenesin) .... Otc as directed. 16)  Victoza 18 Mg/6ml Soln (Liraglutide) .... 0.6 mg  subcutaneously daily x 1 week then increase to 1.2 mg daily 17)  Losartan Potassium 50 Mg Tabs (Losartan potassium) .Marland Kitchen.. 1 tab by mouth daily  Other Orders: Rapid Strep (81191)   Orders Added: 1)  Rapid Strep [87880] 2)  Est. Patient Level III [47829]  Current Allergies (reviewed today): ! SULFA  Laboratory Results    Other Tests  Rapid Strep: negative  Kit Test Internal QC: Positive   (Normal Range: Negative)

## 2010-12-10 ENCOUNTER — Other Ambulatory Visit: Payer: Self-pay | Admitting: Family Medicine

## 2010-12-10 ENCOUNTER — Encounter (INDEPENDENT_AMBULATORY_CARE_PROVIDER_SITE_OTHER): Payer: 59 | Admitting: Family Medicine

## 2010-12-10 ENCOUNTER — Encounter: Payer: Self-pay | Admitting: Family Medicine

## 2010-12-10 ENCOUNTER — Ambulatory Visit: Payer: Self-pay | Admitting: Pain Medicine

## 2010-12-10 DIAGNOSIS — Z0000 Encounter for general adult medical examination without abnormal findings: Secondary | ICD-10-CM

## 2010-12-10 DIAGNOSIS — M79609 Pain in unspecified limb: Secondary | ICD-10-CM | POA: Insufficient documentation

## 2010-12-10 DIAGNOSIS — E039 Hypothyroidism, unspecified: Secondary | ICD-10-CM

## 2010-12-10 DIAGNOSIS — Z01419 Encounter for gynecological examination (general) (routine) without abnormal findings: Secondary | ICD-10-CM

## 2010-12-10 LAB — TSH: TSH: 1.01 u[IU]/mL (ref 0.35–5.50)

## 2010-12-10 LAB — HM PAP SMEAR

## 2010-12-10 LAB — CONVERTED CEMR LAB

## 2010-12-10 LAB — HM DIABETES FOOT EXAM

## 2010-12-10 NOTE — Medication Information (Signed)
Summary: Order for Diabetic Testing Supplies  Order for Diabetic Testing Supplies   Imported By: Maryln Gottron 12/05/2010 14:53:52  _____________________________________________________________________  External Attachment:    Type:   Image     Comment:   External Document

## 2010-12-16 ENCOUNTER — Ambulatory Visit: Payer: 59 | Admitting: Internal Medicine

## 2010-12-16 ENCOUNTER — Encounter: Payer: Self-pay | Admitting: Internal Medicine

## 2010-12-16 DIAGNOSIS — L538 Other specified erythematous conditions: Secondary | ICD-10-CM | POA: Insufficient documentation

## 2010-12-17 NOTE — Assessment & Plan Note (Signed)
Summary: CPX/DLO   Vital Signs:  Patient profile:   60 year old female Height:      63.5 inches Weight:      360.50 pounds BMI:     63.09 Temp:     97.7 degrees F oral Pulse rate:   72 / minute Pulse rhythm:   regular BP sitting:   120 / 80  (left arm) Cuff size:   large  Vitals Entered By: Benny Lennert CMA Duncan Dull) (December 10, 2010 11:14 AM)  History of Present Illness: Chief complaint cpx  The patient is here for annual wellness exam and preventative care.      DM, well controlled on current meds.  On victoza 1.8 no nausea now, metformin Some fluid comes out after injection.. in leg of victoza? getting ful dose? No weight loss.  Trying to walk daily. Moderate diet.. she stays hungry a lot.  Has occassional episodes of feeling weak..has to sit down. NO CP, no SOB. Did not check blood sugar.. does not have meter that is easily portable.    B leg pain limitiing exercise. Hip pain B after walking. Intermiitant severe swelling. Has varicose veins.  Using celebreax daily. Rarely using any percocet.. for hip pain.   Seeing Dr. Metta Clines for shots in her neck for cervical radiculopathy issues.    Trys to elevate leg above heart.  Problems Prior to Update: 1)  Acute Pharyngitis  (ICD-462) 2)  Trigger Finger, Right Middle  (ICD-727.03) 3)  Hypertension  (ICD-401.9) 4)  Hip Pain  (ICD-719.45) 5)  Cervical Radiculopathy, Right  (ICD-723.4) 6)  Preventive Health Care  (ICD-V70.0) 7)  Routine Gynecological Examination  (ICD-V72.31) 8)  Asthma, With Acute Exacerbation  (ICD-493.92) 9)  Diabetic Peripheral Neuropathy  (ICD-250.60) 10)  Benign Positional Vertigo  (ICD-386.11) 11)  Urinary Incontinence, Mixed  (ICD-788.33) 12)  Screening For Mlig Neop, Breast, Nos  (ICD-V76.10) 13)  Hypercholesterolemia  (ICD-272.0) 14)  Family History of Cad Female 1st Degree Relative <50  (ICD-V17.3) 15)  Family History of Cad Female 1st Degree Relative <60  (ICD-V16.49) 16)  Allergic Rhinitis   (ICD-477.9) 17)  Sleep Apnea  (ICD-780.57) 18)  Gerd  (ICD-530.81) 19)  Hypothyroidism  (ICD-244.9) 20)  Bipolar Affective Disorder, Depressed  (ICD-296.50) 21)  Diabetes Mellitus, Type II  (ICD-250.00) 22)  Asthma  (ICD-493.90) 23)  Osteoarthritis  (ICD-715.90) 24)  Anemia-nos  (ICD-285.9)  Current Medications (verified): 1)  Adult Aspirin Ec Low Strength 81 Mg  Tbec (Aspirin) .... Take 1 Tablet By Mouth Once A Day 2)  Oxcarbazepine 300 Mg  Tabs (Oxcarbazepine) .... Take Tablet By Mouth in The Morning  and 2 Tablets By Mouth Each Evening 3)  Levothyroxine Sodium 200 Mcg  Tabs (Levothyroxine Sodium) .... Take 1 Tablet By Mouth Once A Day 4)  Furosemide 80 Mg  Tabs (Furosemide) .... Take 1/2 Tab By Mouth Two Times A Day As Needed 5)  Albuterol 90 Mcg/act  Aers (Albuterol) .... 2 Puff Inh As Needed 6)  Simvastatin 40 Mg  Tabs (Simvastatin) .... Take 1 Tablet By Mouth Once A Day 7)  Metformin Hcl 500 Mg  Tb24 (Metformin Hcl) .... Extended Release Take 2 Tablet By Mouth Once A Day 8)  Advair Hfa 115-21 Mcg/act  Aero (Fluticasone-Salmeterol) .... 2 Puff Once Daily 9)  Fish Oil Concentrate 300 Mg  Caps (Omega-3 Fatty Acids) .... Take 1 Capsule By Mouth Once A Day 10)  Singulair 10 Mg  Tabs (Montelukast Sodium) .Marland Kitchen.. 1 Tab By Mouth Qhs 11)  Celebrex 200 Mg Caps (Celecoxib) .... Take 1 Capsule By Mouth Once A Day or Up To Twice A Day 12)  Lamotrigine 200 Mg Tabs (Lamotrigine) .... Once A Day 13)  Allegra 180 Mg Tabs (Fexofenadine Hcl) .Marland Kitchen.. 1 Tab By Mouth Daily As Needed 14)  Percocet 10-325 Mg Tabs (Oxycodone-Acetaminophen) .Marland Kitchen.. 1-2 Tablets Every 4-6 Hours As Needed For Pain 15)  Mucinex 600 Mg Xr12h-Tab (Guaifenesin) .... Otc As Directed. 16)  Victoza 18 Mg/63ml Soln (Liraglutide) .... 0.6 Mg  Subcutaneously Daily X 1 Week Then Increase To 1.2 Mg Daily 17)  Losartan Potassium 50 Mg Tabs (Losartan Potassium) .Marland Kitchen.. 1 Tab By Mouth Daily  Allergies: 1)  ! Sulfa  Past History:  Past medical,  surgical, family and social histories (including risk factors) reviewed, and no changes noted (except as noted below).  Past Medical History: Reviewed history from 07/30/2007 and no changes required. Anemia-NOS Osteoarthritis Asthma, moderate pesistant Diabetes mellitus, type II Hypothyroidism GERD Allergic rhinitis  Past Surgical History: Reviewed history from 07/30/2007 and no changes required. L hip replacement 2002 hysterectomy, partial abdominal 1982 for mennorhagia, no cervix left foot surgery bone spur 1982 neck surgery, herniated disc C2,3,4 TMJ surgery 1982 hosp for bipolar 10 years ago  Family History: Reviewed history from 07/30/2007 and no changes required. father died age 1 aneyrsym, alcoholism mother died age 43 MVA 5 siblings: CAD, MI in 27s, late 96s Family History of CAD Female 1st degree relative <60 Family History of CAD Female 1st degree relative <50  Social History: Reviewed history from 07/30/2007 and no changes required. Occupation:accounting in past but currently unemployed Married 3 children Current Smoker 20 pack year history Alcohol use-yes Drug use-no Regular exercise-no  Review of Systems General:  Complains of fatigue. CV:  Denies chest pain or discomfort. Resp:  Denies shortness of breath. GI:  Denies abdominal pain and bloody stools. GU:  Denies abnormal vaginal bleeding and dysuria.  Physical Exam  General:  morbidly obese female in NAD  Eyes:  No corneal or conjunctival inflammation noted. EOMI. Perrla.  Vision grossly normal. Ears:  External ear exam shows no significant lesions or deformities.  Otoscopic examination reveals clear canals, tympanic membranes are intact bilaterally without bulging, retraction, inflammation or discharge. Hearing is grossly normal bilaterally. Nose:  External nasal examination shows no deformity or inflammation. Nasal mucosa are pink and moist without lesions or exudates. Mouth:  mild erythema Neck:   no carotid bruit or thyromegaly no cervical or supraclavicular lymphadenopathy  Limited ROM due to pain and past fusion Chest Wall:  No deformities, masses, or tenderness noted. Breasts:  No mass, nodules, thickening, tenderness, bulging, retraction, inflamation, nipple discharge or skin changes noted.   Lungs:  Normal respiratory effort, chest expands symmetrically. Lungs are clear to auscultation, no crackles or wheezes. Heart:  Normal rate and regular rhythm. S1 and S2 normal without gallop, murmur, click, rub or other extra sounds. Abdomen:  Bowel sounds positive,abdomen soft and non-tender without masses, organomegaly or hernias noted. Genitalia:  normal introitus, no external lesions, no uterus, and no adnexal masses or tenderness although difficult exam due to central obesity Msk:  No deformity or scoliosis noted of thoracic or lumbar spine.   Pulses:  R and L posterior tibial pulses are full and equal bilaterally  Extremities:  B varicose veins  Diabetes Management Exam:    Foot Exam (with socks and/or shoes not present):       Sensory-Pinprick/Light touch:          Left  medial foot (L-4): normal          Left dorsal foot (L-5): normal          Left lateral foot (S-1): normal          Right medial foot (L-4): normal          Right dorsal foot (L-5): normal          Right lateral foot (S-1): normal       Sensory-Monofilament:          Left foot: normal          Right foot: normal       Inspection:          Left foot: abnormal             Comments: calluses B          Right foot: abnormal       Nails:          Left foot: normal          Right foot: normal   Impression & Recommendations:  Problem # 1:  PREVENTIVE HEALTH CARE (ICD-V70.0) The patient's preventative maintenance and recommended screening tests for an annual wellness exam were reviewed in full today. Brought up to date unless services declined.  Counselled on the importance of diet, exercise, and its role in  overall health and mortality. The patient's FH and SH was reviewed, including their home life, tobacco status, and drug and alcohol status.     Problem # 2:  ROUTINE GYNECOLOGICAL EXAMINATION (ICD-V72.31) DVE no PAP  Problem # 3:  DIABETES MELLITUS, TYPE II (ICD-250.00) Well controlled but minimal weight loss. Discussed considering bariatric surgery.  Try injection of victoza in differeent area, less taut. Hold in for 15 seconds.  Encouraged exercise, weight loss, healthy eating habits.  Her updated medication list for this problem includes:    Adult Aspirin Ec Low Strength 81 Mg Tbec (Aspirin) .Marland Kitchen... Take 1 tablet by mouth once a day    Metformin Hcl 500 Mg Tb24 (Metformin hcl) ..... Extended release take 2 tablet by mouth once a day    Victoza 18 Mg/64ml Soln (Liraglutide) .Marland Kitchen... 0.6 mg  subcutaneously daily x 1 week then increase to 1.2 mg daily    Losartan Potassium 50 Mg Tabs (Losartan potassium) .Marland Kitchen... 1 tab by mouth daily  Problem # 4:  HYPOTHYROIDISM (ICD-244.9)  TSH: 0.60 (07/03/2009 10:10:23 AM)  Due for reeval.  Her updated medication list for this problem includes:    Levothyroxine Sodium 200 Mcg Tabs (Levothyroxine sodium) .Marland Kitchen... Take 1 tablet by mouth once a day  Orders: TLB-TSH (Thyroid Stimulating Hormone) (84443-TSH)  Problem # 5:  LEG PAIN (ICD-729.5) Likely multifactorial due to obesity, venous insufficency and peripheral swelling.  elevate legs, compression hose as tolerated.  Water exercsie and weight loss.   Complete Medication List: 1)  Adult Aspirin Ec Low Strength 81 Mg Tbec (Aspirin) .... Take 1 tablet by mouth once a day 2)  Oxcarbazepine 300 Mg Tabs (Oxcarbazepine) .... Take tablet by mouth in the morning  and 2 tablets by mouth each evening 3)  Levothyroxine Sodium 200 Mcg Tabs (Levothyroxine sodium) .... Take 1 tablet by mouth once a day 4)  Furosemide 80 Mg Tabs (Furosemide) .... Take 1/2 tab by mouth two times a day as needed 5)  Albuterol 90 Mcg/act  Aers (Albuterol) .... 2 puff inh as needed 6)  Simvastatin 40 Mg Tabs (Simvastatin) .... Take 1 tablet by mouth once  a day 7)  Metformin Hcl 500 Mg Tb24 (Metformin hcl) .... Extended release take 2 tablet by mouth once a day 8)  Advair Hfa 115-21 Mcg/act Aero (Fluticasone-salmeterol) .... 2 puff once daily 9)  Fish Oil Concentrate 300 Mg Caps (Omega-3 fatty acids) .... Take 1 capsule by mouth once a day 10)  Singulair 10 Mg Tabs (Montelukast sodium) .Marland Kitchen.. 1 tab by mouth qhs 11)  Celebrex 200 Mg Caps (Celecoxib) .... Take 1 capsule by mouth once a day or up to twice a day 12)  Lamotrigine 200 Mg Tabs (Lamotrigine) .... Once a day 13)  Allegra 180 Mg Tabs (Fexofenadine hcl) .Marland Kitchen.. 1 tab by mouth daily as needed 14)  Percocet 10-325 Mg Tabs (Oxycodone-acetaminophen) .Marland Kitchen.. 1-2 tablets every 4-6 hours as needed for pain 15)  Mucinex 600 Mg Xr12h-tab (Guaifenesin) .... Otc as directed. 16)  Victoza 18 Mg/61ml Soln (Liraglutide) .... 0.6 mg  subcutaneously daily x 1 week then increase to 1.2 mg daily 17)  Losartan Potassium 50 Mg Tabs (Losartan potassium) .Marland Kitchen.. 1 tab by mouth daily  Other Orders: Radiology Referral (Radiology)  Patient Instructions: 1)  Referral Appointment Information 2)  Day/Date: 3)  Time: 4)  Place/MD: 5)  Address: 6)  Phone/Fax: 7)  Patient given appointment information. Information/Orders faxed/mailed.  8)  Check blood sugar when feeling weak.. call if running consistently <60. Treat with OJ as needed. 9)   Call if blood sugars are not running low and continuing to have weak spells. 10)  Inject victoza in different area.. such as arm.. hold in place 15 -20 seconds after injection.  11)  Please schedule a follow-up appointment in 3 months  30 min DM 12)  BMP prior to visit, ICD-9: 250.00 13)  Hepatic Panel prior to visit ICD-9:  14)  Lipid panel prior to visit ICD-9 :  15)  HgBA1c prior to visit  ICD-9:    Orders Added: 1)  Radiology Referral [Radiology] 2)  TLB-TSH  (Thyroid Stimulating Hormone) [84443-TSH] 3)  Est. Patient 40-64 years [99396]    Current Allergies (reviewed today): ! SULFA   Past Medical History:    Reviewed history from 07/30/2007 and no changes required:       Anemia-NOS       Osteoarthritis       Asthma, moderate pesistant       Diabetes mellitus, type II       Hypothyroidism       GERD       Allergic rhinitis  Past Surgical History:    Reviewed history from 07/30/2007 and no changes required:       L hip replacement 2002       hysterectomy, partial abdominal 1982 for mennorhagia, no cervix left       foot surgery bone spur 1982       neck surgery, herniated disc C2,3,4       TMJ surgery 1982       hosp for bipolar 10 years ago   Last Flu Vaccine:  Fluvax 3+ (06/25/2010 9:19:53 AM) Flu Vaccine Next Due:  1 yr Last PAP:  DVE for ovaries, no uterus (10/17/2009 1:54:49 PM) PAP Result Date:  12/10/2010 PAP Result:  DVE for ovaries, no cervix PAP Next Due:  1 yr

## 2010-12-18 ENCOUNTER — Ambulatory Visit: Payer: Self-pay | Admitting: Pain Medicine

## 2010-12-26 NOTE — Letter (Signed)
Summary: [facility]   Imported By: Kassie Mends 12/16/2010 08:55:27  _____________________________________________________________________  External Attachment:    Type:   Image     Comment:   External Document

## 2010-12-26 NOTE — Assessment & Plan Note (Signed)
Summary: rash on right leg   Vital Signs:  Patient Profile:   60 Years Old Female CC:      right leg rash x 6 days Height:     63.5 inches (162.56 cm) Pulse rate:   84 / minute Resp:     14 per minute  Pt. in pain?   no Weight > 350 lbs. Yes                   Current Allergies: ! SULFAHistory of Present Illness Chief Complaint: right leg rash x 6 days History of Present Illness: Wt 360 at pcp last week. Hx of similar rashes in skin folds in past and routinely uses corn starch for prevention. Also worsening often responds to nystatin cream.  Since yesterday it's been getting worse, more red, more weepy, and now slightly sore. No ascending symptoms, fever. FBS today 124. No recent changes to meds. BP not checked because we had no cuffs large enough.  REVIEW OF SYSTEMS Constitutional Symptoms      Denies fever, chills, night sweats, weight loss, weight gain, and fatigue.  Eyes       Denies change in vision, eye pain, eye discharge, glasses, contact lenses, and eye surgery. Ear/Nose/Throat/Mouth       Denies hearing loss/aids, change in hearing, ear pain, ear discharge, dizziness, frequent runny nose, frequent nose bleeds, sinus problems, sore throat, hoarseness, and tooth pain or bleeding.  Respiratory       Denies dry cough, productive cough, wheezing, shortness of breath, asthma, bronchitis, and emphysema/COPD.  Cardiovascular       Denies murmurs, chest pain, and tires easily with exhertion.    Gastrointestinal       Denies stomach pain, nausea/vomiting, diarrhea, constipation, blood in bowel movements, and indigestion. Genitourniary       Denies painful urination, kidney stones, and loss of urinary control. Neurological       Denies paralysis, seizures, and fainting/blackouts. Musculoskeletal       Denies muscle pain, joint pain, joint stiffness, decreased range of motion, redness, swelling, muscle weakness, and gout.  Skin       Denies bruising, unusual mles/lumps or  sores, and hair/skin or nail changes.  Psych       Denies mood changes, temper/anger issues, anxiety/stress, speech problems, depression, and sleep problems.  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 right leg skin fold just below knee with redness confluent centrally and with some tiny red satelite macules, wetness but no purulence, and mild surround redness with mild tenderness. No tenderness of thigh Neurological: grossly intact and non-focal Skin: rash described above MSE: oriented to time, place, and person Assessment New Problems: INTERTRIGO, CANDIDAL (ICD-695.89)  Because of possiblity of early cellulitis, zithromax was added.  Plan New Medications/Changes: AZITHROMYCIN 250 MG TABS (AZITHROMYCIN) 2 then 1 by mouth qd  #6 x  0, 12/16/2010, J. Juline Patch MD   The patient and/or caregiver has been counseled thoroughly with regard to medications prescribed including dosage, schedule, interactions, rationale for use, and possible side effects and they verbalize understanding.  Diagnoses and expected course of recovery discussed and will return if not improved as  expected or if the condition worsens. Patient and/or caregiver verbalized understanding.  Prescriptions: AZITHROMYCIN 250 MG TABS (AZITHROMYCIN) 2 then 1 by mouth qd  #6 x 0   Entered and Authorized by:   J. Juline Patch MD   Signed by:   Shela Commons. Juline Patch MD on 12/16/2010   Method used:   Telephoned to ...       [facility]  [address] (retail)       [address]       [place], [place]  [address]       Ph: [phone] or [phone]       Fax: [phone]   RxID:   [id number]   Patient Instructions: 1)  lamisil at cream apply to skin twice daily. 2)  zinc oxide 20% apply after above soaks in. 3)  When better, give up zinc oxide and after lamisil, powder with tinactin powder. 4)  Recheck if worse or if not improving at primary care or dermatology in a few days.

## 2010-12-31 ENCOUNTER — Ambulatory Visit: Payer: Self-pay | Admitting: Family Medicine

## 2010-12-31 NOTE — Letter (Signed)
Summary: History Form  History Form   Imported By: Eugenio Hoes 12/23/2010 12:21:02  _____________________________________________________________________  External Attachment:    Type:   Image     Comment:   External Document

## 2010-12-31 NOTE — Medication Information (Signed)
Summary: Medication List  Medication List   Imported By: Eugenio Hoes 12/23/2010 12:51:50  _____________________________________________________________________  External Attachment:    Type:   Image     Comment:   External Document

## 2011-01-10 ENCOUNTER — Other Ambulatory Visit: Payer: Self-pay | Admitting: Family Medicine

## 2011-01-16 ENCOUNTER — Encounter: Payer: Self-pay | Admitting: Family Medicine

## 2011-01-19 ENCOUNTER — Other Ambulatory Visit: Payer: Self-pay | Admitting: Family Medicine

## 2011-01-31 ENCOUNTER — Encounter: Payer: Self-pay | Admitting: Family Medicine

## 2011-01-31 ENCOUNTER — Ambulatory Visit (INDEPENDENT_AMBULATORY_CARE_PROVIDER_SITE_OTHER): Payer: 59 | Admitting: Family Medicine

## 2011-01-31 VITALS — BP 134/82 | HR 80 | Temp 98.0°F | Ht 64.0 in | Wt 371.1 lb

## 2011-01-31 DIAGNOSIS — J40 Bronchitis, not specified as acute or chronic: Secondary | ICD-10-CM | POA: Insufficient documentation

## 2011-01-31 MED ORDER — HYDROCOD POLST-CHLORPHEN POLST 10-8 MG/5ML PO LQCR: 5.0000 mL | Freq: Every evening | ORAL | Status: DC | PRN

## 2011-01-31 MED ORDER — ALBUTEROL 90 MCG/ACT IN AERS: 2.0000 | INHALATION_SPRAY | Freq: Four times a day (QID) | RESPIRATORY_TRACT | Status: DC | PRN

## 2011-01-31 MED ORDER — AZITHROMYCIN 250 MG PO TABS: ORAL_TABLET | ORAL | Status: AC

## 2011-01-31 NOTE — Progress Notes (Signed)
  Subjective:    Patient ID: Amy Barnes, female    DOB: 09/27/1951, 60 y.o.   MRN: [id number]  HPI CC: congestion, cough  1 1/2 wk h/o tired, congestion ,cough.  Last week felt better, now this week feeling worse (last 2 days).  Chest hurting.  Uses CPAP at night, trouble sleeping.  + coughing productive of green sputum.  Taking alka seltzer as well as advil.  + headache and hoarse voice.  Staying up with cough.  Requests cough syrup.  No fevers/chills, abd pain, n/v/d, rashes, muscle or joint pains.  No ST.  No ear or tooth pain.  No smokers at home.  + h/o asthma and allergies and OSA.  No COPD.  Daughter sick at home as well.  Requests refill of albuterol, states takes advair nightly.  Medications and allergies reviewed and updated as above. PMhx reviewed for relevance.  Review of Systems Per HPI    Objective:   Physical Exam  Vitals reviewed. Constitutional: She appears well-developed and well-nourished. No distress.  HENT:  Head: Normocephalic and atraumatic.  Right Ear: External ear normal.  Left Ear: External ear normal.  Nose: No mucosal edema or rhinorrhea. Right sinus exhibits maxillary sinus tenderness. Right sinus exhibits no frontal sinus tenderness. Left sinus exhibits maxillary sinus tenderness. Left sinus exhibits no frontal sinus tenderness.  Mouth/Throat: Uvula is midline, oropharynx is clear and moist and mucous membranes are normal. No oropharyngeal exudate.  Eyes: Conjunctivae and EOM are normal. Pupils are equal, round, and reactive to light. No scleral icterus.  Neck: Normal range of motion. Neck supple. No JVD present. No thyromegaly present.  Cardiovascular: Normal rate, regular rhythm, normal heart sounds and intact distal pulses.   No murmur heard. Pulmonary/Chest: Effort normal and breath sounds normal. No respiratory distress. She has no wheezes. She has no rales.       Coarse bibasilarly  Lymphadenopathy:    She has no cervical adenopathy.  Skin:  Skin is warm and dry. No rash noted.          Assessment & Plan:

## 2011-01-31 NOTE — Assessment & Plan Note (Addendum)
Started as viral URTI with acute worsening, now likely bronchitis cover for infective with zpack.  No wheezing today, no steroids needed. Refilled albuterol per request, rec use regularly for next few days. tussionex for cough at night.  States has tolerated well in past, advised not to use with percocets. Update [place] if not improving.

## 2011-01-31 NOTE — Patient Instructions (Signed)
Sounds like bronchitis, treat with tussionex and zpack.  Return if not improving, or if any fever >101 or worsening cough/trouble breathing. Use albuterol regularly for next few days. Push fluids and plenty of rest. May use simple mucinex and nasal saline irrigation. Let us know if not feeling better.

## 2011-03-06 ENCOUNTER — Other Ambulatory Visit: Payer: Self-pay | Admitting: Family Medicine

## 2011-03-06 DIAGNOSIS — E119 Type 2 diabetes mellitus without complications: Secondary | ICD-10-CM

## 2011-03-10 ENCOUNTER — Other Ambulatory Visit: Payer: Self-pay | Admitting: Family Medicine

## 2011-03-11 ENCOUNTER — Other Ambulatory Visit (INDEPENDENT_AMBULATORY_CARE_PROVIDER_SITE_OTHER): Payer: 59 | Admitting: Family Medicine

## 2011-03-11 DIAGNOSIS — E119 Type 2 diabetes mellitus without complications: Secondary | ICD-10-CM

## 2011-03-11 LAB — BASIC METABOLIC PANEL
BUN: 22 mg/dL (ref 6–23)
CO2: 24 mEq/L (ref 19–32)
Calcium: 9 mg/dL (ref 8.4–10.5)
Chloride: 108 mEq/L (ref 96–112)
Creatinine, Ser: 0.7 mg/dL (ref 0.4–1.2)
GFR: 92.63 mL/min (ref >60.00)
Glucose, Bld: 141 mg/dL — ABNORMAL HIGH (ref 70–99)
Potassium: 4.5 mEq/L (ref 3.5–5.1)
Sodium: 139 mEq/L (ref 135–145)

## 2011-03-11 LAB — LIPID PANEL
Cholesterol: 132 mg/dL (ref 0–200)
HDL: 49.5 mg/dL (ref >39.00)
LDL Cholesterol: 71 mg/dL (ref 0–99)
Total CHOL/HDL Ratio: 3
Triglycerides: 60 mg/dL (ref 0.0–149.0)
VLDL: 12 mg/dL (ref 0.0–40.0)

## 2011-03-11 LAB — HEPATIC FUNCTION PANEL
ALT: 32 U/L (ref 0–35)
AST: 22 U/L (ref 0–37)
Albumin: 4.2 g/dL (ref 3.5–5.2)
Alkaline Phosphatase: 68 U/L (ref 39–117)
Bilirubin, Direct: 0.1 mg/dL (ref 0.0–0.3)
Total Bilirubin: 0.3 mg/dL (ref 0.3–1.2)
Total Protein: 7.1 g/dL (ref 6.0–8.3)

## 2011-03-11 LAB — HEMOGLOBIN A1C: Hgb A1c MFr Bld: 6.3 % (ref 4.6–6.5)

## 2011-03-14 ENCOUNTER — Ambulatory Visit: Payer: 59 | Admitting: Family Medicine

## 2011-03-20 ENCOUNTER — Other Ambulatory Visit: Payer: Self-pay | Admitting: Family Medicine

## 2011-03-22 ENCOUNTER — Other Ambulatory Visit: Payer: Self-pay | Admitting: Family Medicine

## 2011-03-25 ENCOUNTER — Encounter: Payer: Self-pay | Admitting: Family Medicine

## 2011-03-25 ENCOUNTER — Ambulatory Visit (INDEPENDENT_AMBULATORY_CARE_PROVIDER_SITE_OTHER): Payer: 59 | Admitting: Family Medicine

## 2011-03-25 ENCOUNTER — Other Ambulatory Visit: Payer: Self-pay | Admitting: *Deleted

## 2011-03-25 DIAGNOSIS — E119 Type 2 diabetes mellitus without complications: Secondary | ICD-10-CM

## 2011-03-25 DIAGNOSIS — R1031 Right lower quadrant pain: Secondary | ICD-10-CM | POA: Insufficient documentation

## 2011-03-25 DIAGNOSIS — E7800 Pure hypercholesterolemia, unspecified: Secondary | ICD-10-CM

## 2011-03-25 DIAGNOSIS — I10 Essential (primary) hypertension: Secondary | ICD-10-CM

## 2011-03-25 MED ORDER — CELECOXIB 200 MG PO CAPS: 200.0000 mg | ORAL_CAPSULE | Freq: Two times a day (BID) | ORAL | Status: DC

## 2011-03-25 MED ORDER — FUROSEMIDE 80 MG PO TABS: 40.0000 mg | ORAL_TABLET | Freq: Two times a day (BID) | ORAL | Status: DC | PRN

## 2011-03-25 NOTE — Progress Notes (Signed)
Subjective:    Patient ID: Amy Barnes, female    DOB: [birth year], 60 y.o.   MRN: [id number]  HPI Diabetes:  Excellent control on metformin and victoza.  Has had 6 lb weight loss in last few months. Using medications without difficulties:NoneF FBS:147-171 in past few weeks. She  Has been getting minimal exercsie and has not been eating as well in past few weeks. Increase stress in past few weeks.  eye exam within last year:  She has made a decision that she would like to look into bariatric surgery.   Elevated Cholesterol:Well controlled on simvastatin Using medications without problems:Yes Muscle aches: None   Hypertension:  Well controlled on losartan. Using medication without problems or lightheadedness:  Chest pain with exertion:None Edema:None Short of breath:SOme in hot weather, using albuterol for asthma every week Average home BPs: Not checking at home. Other issues:  She has noted a lot of bloating in abdomen. Ongoing x 6 weeks.  A lot of rectal gas and burping. Also pain intermittantly on right sharp lower abdomen.  Nothing makes it worse. No associations. She does feel some relief when she has a BM. No N/V, no diarrhea, usually constipated.    Review of Systems  Constitutional: Negative for fever and fatigue.  HENT: Negative for ear pain.   Eyes: Negative for pain.  Respiratory: Negative for chest tightness and shortness of breath.   Cardiovascular: Negative for chest pain, palpitations and leg swelling.  Gastrointestinal: Positive for abdominal pain and abdominal distention. Negative for nausea, vomiting, diarrhea, constipation, blood in stool and rectal pain.  Genitourinary: Positive for pelvic pain. Negative for dysuria, frequency and vaginal bleeding.       Objective:   Physical Exam  Constitutional: Vital signs are normal. She appears well-developed and well-nourished. She is cooperative.  Non-toxic appearance. She does not appear ill. No distress.         Morbidly obese female in NAD  HENT:  Head: Normocephalic.  Right Ear: Hearing, tympanic membrane, external ear and ear canal normal. Tympanic membrane is not erythematous, not retracted and not bulging.  Left Ear: Hearing, tympanic membrane, external ear and ear canal normal. Tympanic membrane is not erythematous, not retracted and not bulging.  Nose: No mucosal edema or rhinorrhea. Right sinus exhibits no maxillary sinus tenderness and no frontal sinus tenderness. Left sinus exhibits no maxillary sinus tenderness and no frontal sinus tenderness.  Mouth/Throat: Uvula is midline, oropharynx is clear and moist and mucous membranes are normal.  Eyes: Conjunctivae, EOM and lids are normal. Pupils are equal, round, and reactive to light. No foreign bodies found.  Neck: Trachea normal and normal range of motion. Neck supple. Carotid bruit is not present. No mass and no thyromegaly present.  Cardiovascular: Normal rate, regular rhythm, S1 normal, S2 normal, normal heart sounds, intact distal pulses and normal pulses.  Exam reveals no gallop and no friction rub.   No murmur heard. Pulmonary/Chest: Effort normal and breath sounds normal. Not tachypneic. No respiratory distress. She has no decreased breath sounds. She has no wheezes. She has no rhonchi. She has no rales.  Abdominal: Soft. Normal appearance and bowel sounds are normal. She exhibits no shifting dullness. There is no splenomegaly or hepatomegaly. There is no tenderness. There is no rigidity, no rebound, no guarding, no CVA tenderness, no tenderness at McBurney's point and negative Murphy's sign.       No focal pain, but pt points to low area of pannus, staes deep to this at times,  difficult to palpate  Neurological: She is alert.  Skin: Skin is warm, dry and intact. No rash noted.  Psychiatric: Her speech is normal and behavior is normal. Judgment and thought content normal. Her mood appears not anxious. Cognition and memory are normal.  She does not exhibit a depressed mood.       Diabetic foot exam: Normal inspection No skin breakdown No calluses  Normal DP pulses Normal sensation to light touch and monofilament Nails normal    Assessment & Plan:

## 2011-03-25 NOTE — Assessment & Plan Note (Signed)
Right lower quadrant pain and abdominal bloating and gas most likely related to her constipation and past abdominal surgeries.  not typical for appendicitis, but pt warned of symptoms that should make her call for emergent evaluation. Will treat constipation with miralax, fiber and water. Start align for a probiotic. If not improving consider abdominal US vs CT to eval right ovary and appendix.

## 2011-03-25 NOTE — Assessment & Plan Note (Signed)
Well controlled. Continue current medication.  

## 2011-03-25 NOTE — Assessment & Plan Note (Signed)
Well controlled per A1C , but recent worsening in her home numbers. Work ongetitng back on track with healthy eating.  Doubt victoza causing GI issues given not temporaily related, but consider if not improving otherwise.

## 2011-03-25 NOTE — Telephone Encounter (Signed)
Patient is asking if she could get a 90 day supply instead of 30.

## 2011-03-25 NOTE — Patient Instructions (Addendum)
Try to get back on track with helthy eating. Avoid eating out.  Start probiotic like over the [employer]. Start mirilax daily to as needed for constipation.  Increase fiber and water in diet.  If intermittant right lower abdominal pain and gas not improving with improvement in constipation please call to set up an [place] to evaluate ovaries. Call sooner if fever, constant pain or vomiting or go to ER.

## 2011-04-19 ENCOUNTER — Other Ambulatory Visit: Payer: Self-pay | Admitting: Family Medicine

## 2011-05-13 ENCOUNTER — Ambulatory Visit (INDEPENDENT_AMBULATORY_CARE_PROVIDER_SITE_OTHER): Payer: 59 | Admitting: Family Medicine

## 2011-05-13 ENCOUNTER — Encounter (HOSPITAL_BASED_OUTPATIENT_CLINIC_OR_DEPARTMENT_OTHER)
Admission: RE | Admit: 2011-05-13 | Discharge: 2011-05-13 | Disposition: A | Discharge status: Home / Self Care | Payer: 59 | Source: Ambulatory Visit | Attending: Orthopedic Surgery | Admitting: Orthopedic Surgery

## 2011-05-13 ENCOUNTER — Encounter: Payer: Self-pay | Admitting: Family Medicine

## 2011-05-13 LAB — BASIC METABOLIC PANEL
BUN: 15 mg/dL (ref 6–23)
CO2: 29 mEq/L (ref 19–32)
Calcium: 10 mg/dL (ref 8.4–10.5)
Chloride: 103 mEq/L (ref 96–112)
Creatinine, Ser: 0.6 mg/dL (ref 0.50–1.10)
GFR calc Af Amer: >60 mL/min (ref >60)
GFR calc non Af Amer: >60 mL/min (ref >60)
Glucose, Bld: 151 mg/dL — ABNORMAL HIGH (ref 70–99)
Potassium: 4.4 mEq/L (ref 3.5–5.1)
Sodium: 138 mEq/L (ref 135–145)

## 2011-05-13 NOTE — Assessment & Plan Note (Signed)
Total visit time 30 minutes, > 50% spent counseling, answering questionas about bariatric surgeries and coordinating patients care.  I will complete letter of medical necessity for pt and paperwork from [facility] once pt supplies me with list of past efforts for weight loss , both dietary and  exercise oriented.

## 2011-05-13 NOTE — Progress Notes (Signed)
  Subjective:    Patient ID: Amy Barnes, female    DOB: December 18, 1950, 60 y.o.   MRN: [id number]  HPI  60 year old female with BMI of 62.  She has multiple morbidities that will improve with aggressive weight loss... DM, sleep apnea, HTN,  Asthma, diabetic peripheral neuropathy, high cholesterol, chronic bilateral hip pain, and GERD.  Pt came with multiple questions regarding bariatric procedures. She has been to informational session at [facility].    Review of Systems  Constitutional: Negative for fever and fatigue.  HENT: Negative for ear pain.   Eyes: Negative for pain.  Respiratory: Negative for chest tightness and shortness of breath.   Cardiovascular: Negative for chest pain, palpitations and leg swelling.  Gastrointestinal: Negative for abdominal pain.  Genitourinary: Negative for dysuria.       Objective:   Physical Exam  Constitutional:       Morbidly obese  Cardiovascular: Normal rate, regular rhythm, normal heart sounds and intact distal pulses.  Exam reveals no gallop and no friction rub.   No murmur heard. Pulmonary/Chest: Effort normal and breath sounds normal. No respiratory distress. She has no wheezes. She has no rales. She exhibits no tenderness.  Abdominal: Soft. Bowel sounds are normal. There is no tenderness. There is no rebound.  Skin: Skin is warm and dry.  Psychiatric: She has a normal mood and affect. Her behavior is normal.          Assessment & Plan:

## 2011-05-15 ENCOUNTER — Ambulatory Visit (HOSPITAL_BASED_OUTPATIENT_CLINIC_OR_DEPARTMENT_OTHER)
Admission: RE | Admit: 2011-05-15 | Discharge: 2011-05-15 | Disposition: A | Discharge status: Home / Self Care | Payer: 59 | Source: Ambulatory Visit | Attending: Orthopedic Surgery | Admitting: Orthopedic Surgery

## 2011-05-15 DIAGNOSIS — G5600 Carpal tunnel syndrome, unspecified upper limb: Secondary | ICD-10-CM | POA: Insufficient documentation

## 2011-05-15 DIAGNOSIS — Z01812 Encounter for preprocedural laboratory examination: Secondary | ICD-10-CM | POA: Insufficient documentation

## 2011-05-15 DIAGNOSIS — M6530 Trigger finger, unspecified finger: Secondary | ICD-10-CM | POA: Insufficient documentation

## 2011-05-15 DIAGNOSIS — Z01810 Encounter for preprocedural cardiovascular examination: Secondary | ICD-10-CM | POA: Insufficient documentation

## 2011-05-15 HISTORY — PX: TRIGGER FINGER RELEASE: SHX641

## 2011-05-15 HISTORY — PX: CARPAL TUNNEL RELEASE: SHX101

## 2011-05-15 LAB — GLUCOSE, CAPILLARY
Glucose-Capillary: 141 mg/dL — ABNORMAL HIGH (ref 70–99)
Glucose-Capillary: 137 mg/dL — ABNORMAL HIGH (ref 70–99)

## 2011-05-16 LAB — POCT HEMOGLOBIN-HEMACUE: Hemoglobin: 13.4 g/dL (ref 12.0–15.0)

## 2011-05-26 ENCOUNTER — Ambulatory Visit (INDEPENDENT_AMBULATORY_CARE_PROVIDER_SITE_OTHER): Payer: 59 | Admitting: Family Medicine

## 2011-05-26 ENCOUNTER — Ambulatory Visit (INDEPENDENT_AMBULATORY_CARE_PROVIDER_SITE_OTHER)
Admission: RE | Admit: 2011-05-26 | Discharge: 2011-05-26 | Disposition: A | Discharge status: Home / Self Care | Payer: 59 | Source: Ambulatory Visit

## 2011-05-26 ENCOUNTER — Encounter: Payer: Self-pay | Admitting: Family Medicine

## 2011-05-26 VITALS — BP 124/82 | HR 84 | Temp 98.2°F | Wt 368.2 lb

## 2011-05-26 DIAGNOSIS — R51 Headache: Secondary | ICD-10-CM

## 2011-05-26 DIAGNOSIS — R519 Headache, unspecified: Secondary | ICD-10-CM | POA: Insufficient documentation

## 2011-05-26 HISTORY — DX: Essential (primary) hypertension: I10

## 2011-05-26 MED ORDER — KETOROLAC TROMETHAMINE 60 MG/2ML IM SOLN
30.0000 mg | Freq: Once | INTRAMUSCULAR | Status: AC
  Administered 2011-05-26: 30 mg via INTRAMUSCULAR

## 2011-05-26 MED ORDER — PROMETHAZINE HCL 25 MG/ML IJ SOLN
25.0000 mg | Freq: Once | INTRAMUSCULAR | Status: AC
  Administered 2011-05-26: 25 mg via INTRAMUSCULAR

## 2011-05-26 MED ORDER — CYCLOBENZAPRINE HCL 10 MG PO TABS: 10.0000 mg | ORAL_TABLET | Freq: Three times a day (TID) | ORAL | Status: AC | PRN

## 2011-05-26 NOTE — Assessment & Plan Note (Addendum)
Sudden onset new headache. Neuro exam nonfocal. Some migraine components (photo/phonophobia, assoc with nausea), however no history of migraines in past, atypical age for migraine onset. Will obtain head CT to rule out acute bleed, other cause of sudden new onset headache. fmhx brain aneurysm with sudden death. Shot of toradol/phenergan for pain currently, then if CT normal, treat presumed migraines with flexeril abortive.  Return if HA not improving, if worsening to ER.

## 2011-05-26 NOTE — Progress Notes (Signed)
Subjective:    Patient ID: Amy Barnes, female    DOB: 1950-10-31, 60 y.o.   MRN: [id number]  HPI CC: HA  2d h/o body aches.  That eased off yesterday, then started again last night.  At 5am this morning head started hurting when sat up to use bathroom.  "Excrutiating pain" at its worst 9/10, currently 6/10.  Pain described as constant ache throughout top of head.  Worsened with bright lights/loud noises.  + nausea with this.  Feeling taser sound when closes eyes on left side of head.  Tried cold compresses to head as well as dark room and sat in recliner - this helped some.  Also took percocets which didn't help, took goody powder and sinus pills which didn't help.  Started on percocets 05/15/2011 after CTS surgery.  Endorses somewhat similar headache like this before, with sinus infection, but sinuses feel fine today and today's headache more severe, not localized to sinuses like prior.  No increased congestion.  No recent ST, cold.  Some clear RN.  No h/o migraines in past.  No fmhx HA, migraines.  Denies weakness, slurred speech, LOC, dizziness.  On trileptal and lamictal for bipolar.  On percocets for recent CTS surgery.  Son drove her here today.  Father with h/o aneurysm, deceased from this years ago, sudden death.  Medications and allergies reviewed and updated in chart.  Past histories reviewed and updated if relevant as below. Patient Active Problem List  Diagnoses  . HYPOTHYROIDISM  . DIABETES MELLITUS, TYPE II  . DIABETIC PERIPHERAL NEUROPATHY  . HYPERCHOLESTEROLEMIA  . ANEMIA-NOS  . BIPOLAR AFFECTIVE DISORDER, DEPRESSED  . BENIGN POSITIONAL VERTIGO  . HYPERTENSION  . ALLERGIC RHINITIS  . ASTHMA  . ASTHMA, WITH ACUTE EXACERBATION  . GERD  . OSTEOARTHRITIS  . HIP PAIN  . SLEEP APNEA  . URINARY INCONTINENCE, MIXED  . CERVICAL RADICULOPATHY, RIGHT  . TRIGGER FINGER, RIGHT MIDDLE  . Abdominal pain, right lower quadrant  . Obesity, morbid (more than 100 lbs over ideal  weight or BMI > 40)   Past Medical History  Diagnosis Date  . Anemia   . Osteoarthritis   . Asthma   . Diabetes mellitus   . Hypothyroid   . GERD (gastroesophageal reflux disease)   . Allergic rhinitis   . Bipolar disorder    Past Surgical History  Procedure Date  . Total hip arthroplasty 2002    Left  . Partial hysterectomy 1982    for mennorhagia, no cervix  . Foot surgery 1982    bone spur  . Neck surgery     Herniated disk C2,3,4  . Tmj arthroplasty 1982  . Hospitalized     for bipolar   History  Substance Use Topics  . Smoking status: Former Smoker    Quit date: 04/29/2009  . Smokeless tobacco: Not on file  . Alcohol Use: Yes     very rare   Family History  Problem Relation Age of Onset  . Aneurysm Father   . Alcohol abuse Father   . Coronary artery disease      Siblings  . Heart attack      Siblings  . Coronary artery disease      Family history 1st degree relative <60  . Coronary artery disease      Family history 1st degree relative <50   Allergies  Allergen Reactions  . Sulfonamide Derivatives     REACTION: Rash   Current Outpatient Prescriptions on File Prior  to Visit  Medication Sig Dispense Refill  . albuterol (PROVENTIL,VENTOLIN) 90 MCG/ACT inhaler Inhale 2 puffs into the lungs every 6 (six) hours as needed.  17 g  3  . aspirin 81 MG EC tablet Take 81 mg by mouth daily.        . celecoxib (CELEBREX) 200 MG capsule Take 1 capsule (200 mg total) by mouth 2 (two) times daily.  60 capsule  4  . fexofenadine (ALLEGRA) 180 MG tablet Take 180 mg by mouth daily.        . fluticasone-salmeterol (ADVAIR HFA) 115-21 MCG/ACT inhaler Inhale 2 puffs into the lungs daily.        . furosemide (LASIX) 80 MG tablet Take 0.5 tablets (40 mg total) by mouth 2 (two) times daily as needed.  30 tablet  2  . lamoTRIgine (LAMICTAL) 200 MG tablet Take 200 mg by mouth daily.        Marland Kitchen levothyroxine (SYNTHROID, LEVOTHROID) 200 MCG tablet Take 200 mcg by mouth daily.         . Liraglutide (VICTOZA) 18 MG/3ML SOLN Inject 1.2 mg into the skin daily.        Marland Kitchen losartan (COZAAR) 50 MG tablet 1 TAB BY MOUTH DAILY  30 tablet  7  . metFORMIN (GLUCOPHAGE-XR) 500 MG 24 hr tablet TAKE 2 TABLETS BY MOUTH ONCE A DAY  180 tablet  1  . Oxcarbazepine (TRILEPTAL) 300 MG tablet Take 300 mg by mouth as directed. 1 in the morning and 2 every evening       . oxyCODONE-acetaminophen (PERCOCET) 10-325 MG per tablet Take 1-2 tablets by mouth every 4 (four) hours as needed.        . simvastatin (ZOCOR) 40 MG tablet TAKE 1 TABLET BY MOUTH ONCE A DAY  90 tablet  1  . SINGULAIR 10 MG tablet TAKE 1 TABLET AT BEDTIME  30 tablet  1  . guaiFENesin (MUCINEX) 600 MG 12 hr tablet Take 1,200 mg by mouth as directed.        . Omega-3 Fatty Acids (FISH OIL CONCENTRATE) 300 MG CAPS Take 1 capsule by mouth daily.         Review of Systems Per HPI    Objective:   Physical Exam  Nursing note and vitals reviewed. Constitutional: She is oriented to person, place, and time. She appears well-developed and well-nourished. She appears distressed.       Tearful from pain, sitting in dark room initially, anxious  HENT:  Head: Normocephalic and atraumatic.  Right Ear: Hearing, tympanic membrane, external ear and ear canal normal.  Left Ear: Hearing, tympanic membrane, external ear and ear canal normal.  Nose: Nose normal. No mucosal edema or rhinorrhea. Right sinus exhibits no maxillary sinus tenderness and no frontal sinus tenderness. Left sinus exhibits no maxillary sinus tenderness and no frontal sinus tenderness.  Mouth/Throat: Uvula is midline, oropharynx is clear and moist and mucous membranes are normal. No oropharyngeal exudate, posterior oropharyngeal edema, posterior oropharyngeal erythema or tonsillar abscesses.  Eyes: Conjunctivae and EOM are normal. Pupils are equal, round, and reactive to light. No scleral icterus. Right eye exhibits no nystagmus. Left eye exhibits no nystagmus.  Neck: Normal  range of motion. Neck supple.  Cardiovascular: Normal rate, regular rhythm, normal heart sounds and intact distal pulses.   No murmur heard. Pulmonary/Chest: Effort normal and breath sounds normal. No respiratory distress. She has no wheezes. She has no rales.  Musculoskeletal: She exhibits no edema.  Lymphadenopathy:  She has no cervical adenopathy.  Neurological: She is alert and oriented to person, place, and time. She has normal strength. No cranial nerve deficit or sensory deficit. Coordination normal.       Nl finger to nose.          Assessment & Plan:

## 2011-05-26 NOTE — Patient Instructions (Addendum)
Pass by Marion's office to schedule head CT for today. Shot of toradol/phenergan for headache today. If CT looking ok, may try flexeril (muscle relaxant) to try and stop headache.  May make you sleepy. Please return if not improving with time, above treatment. Please seek urgent care if headache worsening, or any change.

## 2011-05-27 ENCOUNTER — Telehealth: Payer: Self-pay | Admitting: *Deleted

## 2011-05-27 NOTE — Telephone Encounter (Signed)
[facility] Triage Call Report Triage Record Num: [id number] Operator: Ruth Bruzzese Patient Name: Amy Barnes Call Date & Time: 05/26/2011 5:17:09PM Patient Phone: [phone] PCP: Talia Aron Patient Gender: Female PCP Fax : [phone] Patient DOB: 01/11/1951 Practice Name: [facility] Reason for Call: Kate, Other, calling regarding Other. PCP is Gutierrez, Javier. Callback number is [phone]. Kate/Daughter calling on 05/26/11 states pt has spoken with doctor since calling after hours/states pt is being treated for headaches/pt has had a CT scan/medication ordered. Advised to call back if needed Protocol(s) Used: No Contact or Duplicate Contact Calls (Adult) Recommended Outcome per Protocol: No Contact Reason for Outcome: Caller has already spoken with the PCP and has no further questions Care Advice: ~ 05/26/2011 5:26:31PM Page 1 of 1 [email or web address] 

## 2011-05-28 ENCOUNTER — Telehealth: Payer: Self-pay | Admitting: *Deleted

## 2011-05-28 NOTE — Op Note (Signed)
Amy Barnes, Amy Barnes                 ACCOUNT NO.:  [id number]  MEDICAL RECORD NO.:  [id number]  LOCATION:                                 FACILITY:  PHYSICIAN:  [name], MD        DATE OF BIRTH:  11-14-1950  DATE OF PROCEDURE:  05/15/2011 DATE OF DISCHARGE:                              OPERATIVE REPORT   PREOPERATIVE DIAGNOSIS:  Right carpal tunnel syndrome and right long finger trigger digit.  POSTOPERATIVE DIAGNOSIS:  Right carpal tunnel syndrome and right long finger trigger digit.  PROCEDURE:  Right carpal tunnel release and long finger trigger digit release.  SURGEON:  [name], MD.  ASSISTANCE:  None.  ANESTHESIA:  General.  IV FLUIDS:  Per anesthesia flow sheet.  ESTIMATED BLOOD LOSS:  Minimal.  COMPLICATIONS:  None.  SPECIMENS:  None.  TOURNIQUET TIME:  33 minutes.  DISPOSITION:  Stable to PACU.  INDICATIONS:  Amy Barnes is a 60 year old right-hand-dominant female who has had triggering in the right long finger for over a year.  She saw me in the office for evaluation.  She had demonstrable triggering.  Two injections of the flexor tendon sheath were performed, but the triggering always recurred.  She also developed carpal tunnel syndrome. She had numbness and tingling in the digits.  She had a positive Tinel's, Phalen's, and Durkin's.  The median nerve at the carpal tunnel causing symptoms in the long finger.  Nerve conduction studies were performed and confirmed carpal tunnel syndrome on the right with sensory latencies of 3.4 with normal being less than 2.4 and motor nerve latencies of 5.2 with normal being less than 4.0.  We discussed the risks, benefits, and alternatives surgical release of the carpal tunnel and trigger digit.  Risks, benefits and alternatives of surgery were discussed including the risk of blood loss, infection, damage to nerves, vessels, tendons, ligaments bone, failure to procedure, need for additional procedures,  complications with wound healing, continued pain, continued triggering, continued carpal tunnel.  She voiced understanding of these risks and elected to proceed.  OPERATIVE COURSE:  After being identified preoperatively by myself, the patient and I agreed upon procedure and site procedure.  The surgical site was marked.  Risks, benefits, and alternatives of surgery were reviewed and she wished to proceed.  She was given 2 g of IV Ancef as preoperative antibiotic prophylaxis.  She was transferred to the operating room and placed in the operating room table in supine position with the right upper extremity on arm board.  Attempts at nerve block anesthesia were made, but were unsuccessful.  General anesthesia was induced by the anesthesiologist.  Right upper extremity was prepped and draped in normal sterile orthopedic fashion.  A surgical pause was performed between surgeons, Anesthesia, and operating staff and all were agreement as to the patient, procedure and site procedure.  Tourniquet proximal aspect of the forearm was inflated to 250 mmHg after exsanguination of limb with Esmarch bandage.  The incision was made over the transverse carpal ligament.  This was carried into the subcutaneous tissues by spreading technique.  Bipolar electrocautery was used throughout the case to obtain hemostasis.  The transverse carpal  ligament was identified and incised.  Distal extent of the ligament was incised under direct visualization.  The entirety of the proximal aspect of ligament was then incised.  The volar antebrachial fascia was then released at its distal aspect using the scissors.  The nerve was inspected.  It was hyperemic.  The motor branch was identified and was intact.  Finger was placed into the wound to ensure adequate decompression of the median nerve which was the case.  The wound was copiously irrigated with 300 mL of sterile saline.  It was then closed with 4-0 nylon in a  horizontal mattress fashion.  Attention was turned to the right long finger.  Incision was made over the metacarpal head between the proximal and distal palmar crease.  This was oblique in fashion.  This was carried into subcutaneous tissues by spreading technique.  Retractors were used to protect the neurovascular bundles. The A1 pulley was identified and was incised.  There was an A0 pulley as well.  This was incised.  The entirety of the A1 pulley was released sharply.  This was done using scissors.  The very proximal aspect of the A2 pulley was incised approximately 1 mm to allow a cone for traversing of the tendons.  The fingers were placed through a range of motion. There was much better flexion after the release.  There was no catching felt.  The wound was copiously irrigated with 300 mL of sterile saline. The skin was closed with 4-0 nylon in a horizontal mattress fashion. Both wounds were injected with 0.25% plain Marcaine to aid in postoperative analgesia.  10 mL were used.  Wounds were dressed with sterile Xeroform, 4x4s and ABD and wrapped with Kerlix and Ace bandage. Tourniquet was deflated at 33 minutes.  The fingertips were pink with brisk capillary refill after deflation of the tourniquet.  The operative drapes were broken down.  The patient was awoken from anesthesia safely. She was transferred back to the stretcher and taken to PACU in stable condition.  Give her Percocet 5/325 one to two p.o. q.6 h. p.r.n. pain dispensed #40.  I will see her back in the office in 1 week for postoperative followup.     [name], MD     KK/MEDQ  D:  05/15/2011  T:  05/15/2011  Job:  [id number]  Electronically Signed by [name]  on 05/28/2011 03:54:54 PM

## 2011-05-28 NOTE — Telephone Encounter (Signed)
Can we call pt for update on how flexeril is helping HA?

## 2011-05-28 NOTE — Telephone Encounter (Signed)
[facility] Triage Call Report Triage Record Num: [id number] Operator: Sula Rumple Patient Name: Amy Barnes Call Date & Time: 05/26/2011 5:17:09PM Patient Phone: [phone] PCP: Ruthe Mannan Patient Gender: Female PCP Fax : [phone] Patient DOB: 12-Jan-1951 Practice Name: [facility] Reason for Call: [name], Other, calling regarding Other. PCP is [name]. Callback number is [phone]. Kate/Daughter calling on 05/26/11 states pt has spoken with doctor since calling after hours/states pt is being treated for headaches/pt has had a CT scan/medication ordered. Advised to call back if needed Protocol(s) Used: No Contact or Duplicate Contact Calls (Adult) Recommended Outcome per Protocol: No Contact Reason for Outcome: Caller has already spoken with the PCP and has no further questions Care Advice: ~ 05/26/2011 5:26:31PM Page 1 of 1 [email or web address]

## 2011-05-29 NOTE — Telephone Encounter (Signed)
Patient said she was doing [facility] better. She said after she was able to sleep with the flexeril, she woke up and the headache was gone. She appreciated the call.

## 2011-05-29 NOTE — Telephone Encounter (Signed)
Noted. Thanks.

## 2011-06-04 ENCOUNTER — Ambulatory Visit: Payer: 59 | Admitting: Family Medicine

## 2011-06-10 ENCOUNTER — Other Ambulatory Visit: Payer: Self-pay | Admitting: Family Medicine

## 2011-06-12 ENCOUNTER — Other Ambulatory Visit (INDEPENDENT_AMBULATORY_CARE_PROVIDER_SITE_OTHER): Payer: 59

## 2011-06-12 DIAGNOSIS — I10 Essential (primary) hypertension: Secondary | ICD-10-CM

## 2011-06-12 DIAGNOSIS — E7800 Pure hypercholesterolemia, unspecified: Secondary | ICD-10-CM

## 2011-06-12 DIAGNOSIS — R1031 Right lower quadrant pain: Secondary | ICD-10-CM

## 2011-06-12 LAB — HEMOGLOBIN A1C: Hgb A1c MFr Bld: 7.2 % — ABNORMAL HIGH (ref 4.6–6.5)

## 2011-06-19 ENCOUNTER — Encounter: Payer: Self-pay | Admitting: Family Medicine

## 2011-06-19 ENCOUNTER — Other Ambulatory Visit: Payer: Self-pay | Admitting: Family Medicine

## 2011-06-19 ENCOUNTER — Ambulatory Visit (INDEPENDENT_AMBULATORY_CARE_PROVIDER_SITE_OTHER): Payer: 59 | Admitting: Family Medicine

## 2011-06-19 DIAGNOSIS — I10 Essential (primary) hypertension: Secondary | ICD-10-CM

## 2011-06-19 DIAGNOSIS — J0100 Acute maxillary sinusitis, unspecified: Secondary | ICD-10-CM

## 2011-06-19 DIAGNOSIS — E119 Type 2 diabetes mellitus without complications: Secondary | ICD-10-CM

## 2011-06-19 DIAGNOSIS — J019 Acute sinusitis, unspecified: Secondary | ICD-10-CM | POA: Insufficient documentation

## 2011-06-19 DIAGNOSIS — E7800 Pure hypercholesterolemia, unspecified: Secondary | ICD-10-CM

## 2011-06-19 DIAGNOSIS — M25559 Pain in unspecified hip: Secondary | ICD-10-CM

## 2011-06-19 DIAGNOSIS — J45909 Unspecified asthma, uncomplicated: Secondary | ICD-10-CM

## 2011-06-19 MED ORDER — MONTELUKAST SODIUM 10 MG PO TABS: 10.0000 mg | ORAL_TABLET | Freq: Every day | ORAL | Status: DC

## 2011-06-19 MED ORDER — OXYCODONE-ACETAMINOPHEN 10-325 MG PO TABS: 1.0000 | ORAL_TABLET | ORAL | Status: DC | PRN

## 2011-06-19 MED ORDER — AZITHROMYCIN 250 MG PO TABS: ORAL_TABLET | ORAL | Status: AC

## 2011-06-19 NOTE — Assessment & Plan Note (Signed)
Well controlled. Continue current medication.  

## 2011-06-19 NOTE — Assessment & Plan Note (Signed)
Worsened control likey due to infection, surgery, inactivity and poor diet.  Get back on track with lifestyle changes. Continue current meds.  Follow up in 3 months.

## 2011-06-19 NOTE — Patient Instructions (Addendum)
When able work on regular exercise, low carb diet, weight loss. Start antibitoics and continue inhalers, mucinex (no D) Call if shortness of breath or wheezing worsening.  Make appt with Ortho for further eval of left hip pain.

## 2011-06-19 NOTE — Assessment & Plan Note (Signed)
Refer to ORtho for further eval. Using oxycodone and celebrex.

## 2011-06-19 NOTE — Assessment & Plan Note (Signed)
Peak flow 410 today. No clear asthma exac.

## 2011-06-19 NOTE — Progress Notes (Signed)
Subjective:    Patient ID: Amy Barnes, female    DOB: December 15, 1950, 60 y.o.   MRN: [id number]  HPI  2 weeks of ST, nasal congestion, headache, cough, productive cough green phlegm.  Face pain, no ear pain. Using mucinex D, dayquil.  Some SOB and wheeze, using inhalers every 6 hours.  Diabetes: On victoza and glucophage Lab Results  Component Value Date   HGBA1C 7.2* 06/12/2011   Using medications without difficulties: Hypoglycemic episodes:None Hyperglycemic episodes:None Feet problems:None Blood Sugars averaging: 150-160 eye exam within last year: She has had recent wrist surgery for carpal tunnel. No complications, no steroids needed.  Has been very INACTIVE, has gained few pounds back.  Hypertension:    Using medication without problems or lightheadedness:  Chest pain with exertion: None Edema:None Short of breath: See above. Average home BPs:Not checking. Other issues:  Left hip pain: limits her walking. Worse during bad weather. Using celebrex max daily, helps some.  Suing oxycodone rarely for severe breakthrugh pain. Has had issues  Chronically.. Last X-rays were done 5 years ago.  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   She is pausing on setting up bariatric surgery this year.. She feels she needs more home support. May consider again next year, early.  Looking into trying [employer].  Review of Systems  Constitutional: Negative for fever and fatigue.  HENT: Negative for ear pain.   Eyes: Negative for pain.  Respiratory: Positive for shortness of breath and wheezing. Negative for chest tightness.   Cardiovascular: Negative for chest pain, palpitations and leg swelling.  Gastrointestinal: Negative for abdominal pain.  Genitourinary: Negative for dysuria.  Musculoskeletal: Positive for arthralgias and gait problem.       Objective:   Physical Exam  Constitutional: Vital signs are normal. She appears well-developed and well-nourished. She is cooperative.  Non-toxic  appearance. She does not appear ill. No distress.       Morbidly obese  HENT:  Head: Normocephalic.  Right Ear: Hearing, tympanic membrane, external ear and ear canal normal. Tympanic membrane is not erythematous, not retracted and not bulging.  Left Ear: Hearing, tympanic membrane, external ear and ear canal normal. Tympanic membrane is not erythematous, not retracted and not bulging.  Nose: Mucosal edema and rhinorrhea present. Right sinus exhibits maxillary sinus tenderness. Right sinus exhibits no frontal sinus tenderness. Left sinus exhibits maxillary sinus tenderness. Left sinus exhibits no frontal sinus tenderness.  Mouth/Throat: Uvula is midline, oropharynx is clear and moist and mucous membranes are normal.  Eyes: Conjunctivae, EOM and lids are normal. Pupils are equal, round, and reactive to light. No foreign bodies found.  Neck: Trachea normal and normal range of motion. Neck supple. Carotid bruit is not present. No mass and no thyromegaly present.  Cardiovascular: Normal rate, regular rhythm, S1 normal, S2 normal, normal heart sounds, intact distal pulses and normal pulses.  Exam reveals no gallop and no friction rub.   No murmur heard. Pulmonary/Chest: Effort normal and breath sounds normal. Not tachypneic. No respiratory distress. She has no decreased breath sounds. She has no wheezes. She has no rhonchi. She has no rales.  Abdominal: Soft. Normal appearance and bowel sounds are normal. There is no tenderness.  Neurological: She is alert.  Skin: Skin is warm, dry and intact. No rash noted.  Psychiatric: Her speech is normal and behavior is normal. Judgment and thought content normal. Her mood appears not anxious. Cognition and memory are normal. She does not exhibit a depressed mood.    Diabetic  foot exam: Normal inspection No skin breakdown No calluses  Normal DP pulses Normal sensation to light touch and monofilament Nails normal        Assessment & Plan:

## 2011-06-19 NOTE — Assessment & Plan Note (Signed)
Treat with antibiotics gicven length of time of infeciton. Will use Z-pak as this has worked well for pt in past, better than amox. Call if not improving.

## 2011-06-26 ENCOUNTER — Telehealth: Payer: Self-pay | Admitting: *Deleted

## 2011-06-26 DIAGNOSIS — Z96642 Presence of left artificial hip joint: Secondary | ICD-10-CM

## 2011-06-26 DIAGNOSIS — M25559 Pain in unspecified hip: Secondary | ICD-10-CM

## 2011-06-26 NOTE — Telephone Encounter (Signed)
Pt has been having problems with her left hip, says she discussed this with you at her last office visit.  She had planned to see her ortho in [place] but now would like referral to someone local,  in [place].  Please advise.

## 2011-06-27 NOTE — Telephone Encounter (Signed)
Called [facility]. Before an appt can be made patient must bring her xrays over to the dept for Dr Gerrit Heck to review before he will take her on as a patient. I told this to the patient and will fax our records to Dr Jovita Kussmaul office for review. Patient is aware of what she needs to do to be seen. Also advised her to give them previous Orthos name and phone and fax so they will fax a release to get her records.

## 2011-07-20 ENCOUNTER — Other Ambulatory Visit: Payer: Self-pay | Admitting: Family Medicine

## 2011-08-06 ENCOUNTER — Other Ambulatory Visit: Payer: Self-pay | Admitting: Family Medicine

## 2011-08-13 LAB — HM DIABETES EYE EXAM: HM Diabetic Eye Exam: NORMAL

## 2011-08-15 ENCOUNTER — Encounter: Payer: Self-pay | Admitting: Family Medicine

## 2011-08-15 ENCOUNTER — Ambulatory Visit (INDEPENDENT_AMBULATORY_CARE_PROVIDER_SITE_OTHER): Payer: 59 | Admitting: Family Medicine

## 2011-08-15 DIAGNOSIS — Z23 Encounter for immunization: Secondary | ICD-10-CM

## 2011-08-15 DIAGNOSIS — E039 Hypothyroidism, unspecified: Secondary | ICD-10-CM

## 2011-08-15 DIAGNOSIS — I10 Essential (primary) hypertension: Secondary | ICD-10-CM

## 2011-08-15 DIAGNOSIS — E119 Type 2 diabetes mellitus without complications: Secondary | ICD-10-CM

## 2011-08-15 DIAGNOSIS — Z01818 Encounter for other preprocedural examination: Secondary | ICD-10-CM

## 2011-08-15 DIAGNOSIS — J45909 Unspecified asthma, uncomplicated: Secondary | ICD-10-CM

## 2011-08-15 NOTE — Assessment & Plan Note (Signed)
Well controlled. Continue current medication.  

## 2011-08-15 NOTE — Patient Instructions (Signed)
Return next week for fasting labs.. If preop does complete metabolic panel... Please have our lab tech not draw this component.. Only A1C and lipids. Work on low carb and low fat diet to facilitate healing after surgery.

## 2011-08-15 NOTE — Assessment & Plan Note (Signed)
Due for re-eval. 

## 2011-08-15 NOTE — Progress Notes (Signed)
Subjective:    Patient ID: Amy Barnes, female    DOB: 09/08/1951, 60 y.o.   MRN: [id number]  HPI  60 year old female here for surgical clearance for left hip procedure to replace ball and socket of joint. Planned for 11/29... General anesthesia, intubation.  Morbidly obese.  Hypertension:   Well controlled on current meds.   Using medication without problems or lightheadedness:  Chest pain with exertion: None Edema:None Short of breath:None Average home BPs: Other issues: No palpitations.  Last EKG done in 04/2011 NSR, mild LVH. Had prior to carpal tunnel surgery.. Tolerate well.  Stress test and ECHo basically normal in 2008.. Was cleared for surgery at that time as well.   Diabetes: Moderate control, last check.  On victoza, Lab Results  Component Value Date   HGBA1C 7.2* 06/12/2011  Has appt to have A1C to recheck in 08/2011.. Will move forward.   Asthma, well controlled on singulair, advair, allegra Has not used albuterol inhaler in last 6 months    Review of Systems  Constitutional: Negative for fever, fatigue and unexpected weight change.  HENT: Negative for ear pain, congestion, sore throat, sneezing, trouble swallowing and sinus pressure.   Eyes: Negative for pain and itching.  Respiratory: Negative for cough, shortness of breath and wheezing.   Cardiovascular: Negative for chest pain, palpitations and leg swelling.  Gastrointestinal: Negative for nausea, abdominal pain, diarrhea, constipation and blood in stool.  Genitourinary: Negative for dysuria, hematuria, vaginal discharge, difficulty urinating and menstrual problem.  Skin: Negative for rash.  Neurological: Negative for syncope, weakness, light-headedness, numbness and headaches.  Psychiatric/Behavioral: Negative for confusion and dysphoric mood. The patient is not nervous/anxious.        Objective:   Physical Exam  Constitutional: Vital signs are normal. She appears well-developed and well-nourished.  She is cooperative.  Non-toxic appearance. She does not appear ill. No distress.       Morbidly obese  HENT:  Head: Normocephalic.  Right Ear: Hearing, tympanic membrane, external ear and ear canal normal.  Left Ear: Hearing, tympanic membrane, external ear and ear canal normal.  Nose: Nose normal.  Eyes: Conjunctivae, EOM and lids are normal. Pupils are equal, round, and reactive to light. No foreign bodies found.  Neck: Trachea normal and normal range of motion. Neck supple. Carotid bruit is not present. No mass and no thyromegaly present.       Small oropharynx  Cardiovascular: Normal rate, regular rhythm, S1 normal, S2 normal, normal heart sounds and intact distal pulses.  Exam reveals no gallop.   No murmur heard. Pulmonary/Chest: Effort normal and breath sounds normal. No respiratory distress. She has no wheezes. She has no rhonchi. She has no rales.  Abdominal: Soft. Normal appearance and bowel sounds are normal. She exhibits no distension, no fluid wave, no abdominal bruit and no mass. There is no hepatosplenomegaly. There is no tenderness. There is no rebound, no guarding and no CVA tenderness. No hernia.  Genitourinary: Pelvic exam was performed with patient prone.  Lymphadenopathy:    She has no cervical adenopathy.    She has no axillary adenopathy.  Neurological: She is alert. She has normal strength. No cranial nerve deficit or sensory deficit.  Skin: Skin is warm, dry and intact. No rash noted.  Psychiatric: Her speech is normal and behavior is normal. Judgment normal. Her mood appears not anxious. Cognition and memory are normal. She does not exhibit a depressed mood.          Assessment &  Plan:

## 2011-08-15 NOTE — Assessment & Plan Note (Addendum)
Intermediate risk surgery in intermediate risk pt... Biggest risk is morbid obesity... HTN, DM and asthma are well controlled. No symptoms of cardiac disease.  NML recent EKG (04/2011) and cardiac stress testing and ECHO normal in  2008.  Pt will likely tolerate surgery well as she has other recent more mild surgeries. Recommend incentive spirometry following surgery.  Also anesthesia should be aware of sleep apnea and small oropharyngeal opening for intubation.  Vaccines: TDap done 2008, flu vaccine given today , PNA given 2 years ago per pt

## 2011-08-15 NOTE — Assessment & Plan Note (Signed)
Lab Results  Component Value Date   TSH 1.01 12/10/2010

## 2011-08-18 ENCOUNTER — Ambulatory Visit: Payer: Self-pay | Admitting: Orthopedic Surgery

## 2011-08-20 ENCOUNTER — Other Ambulatory Visit (INDEPENDENT_AMBULATORY_CARE_PROVIDER_SITE_OTHER): Payer: 59

## 2011-08-20 DIAGNOSIS — I10 Essential (primary) hypertension: Secondary | ICD-10-CM

## 2011-08-20 DIAGNOSIS — E7800 Pure hypercholesterolemia, unspecified: Secondary | ICD-10-CM

## 2011-08-20 DIAGNOSIS — E119 Type 2 diabetes mellitus without complications: Secondary | ICD-10-CM

## 2011-08-20 LAB — COMPREHENSIVE METABOLIC PANEL
ALT: 32 U/L (ref 0–35)
AST: 23 U/L (ref 0–37)
Albumin: 4.3 g/dL (ref 3.5–5.2)
Alkaline Phosphatase: 66 U/L (ref 39–117)
BUN: 17 mg/dL (ref 6–23)
CO2: 29 mEq/L (ref 19–32)
Calcium: 9.2 mg/dL (ref 8.4–10.5)
Chloride: 104 mEq/L (ref 96–112)
Creatinine, Ser: 0.6 mg/dL (ref 0.4–1.2)
GFR: 110.81 mL/min (ref >60.00)
Glucose, Bld: 154 mg/dL — ABNORMAL HIGH (ref 70–99)
Potassium: 4.3 mEq/L (ref 3.5–5.1)
Sodium: 140 mEq/L (ref 135–145)
Total Bilirubin: 0.7 mg/dL (ref 0.3–1.2)
Total Protein: 7.2 g/dL (ref 6.0–8.3)

## 2011-08-20 LAB — HEMOGLOBIN A1C: Hgb A1c MFr Bld: 7.2 % — ABNORMAL HIGH (ref 4.6–6.5)

## 2011-08-20 LAB — LIPID PANEL
Cholesterol: 130 mg/dL (ref 0–200)
HDL: 55.2 mg/dL (ref >39.00)
LDL Cholesterol: 56 mg/dL (ref 0–99)
Total CHOL/HDL Ratio: 2
Triglycerides: 94 mg/dL (ref 0.0–149.0)
VLDL: 18.8 mg/dL (ref 0.0–40.0)

## 2011-08-25 ENCOUNTER — Ambulatory Visit: Payer: 59 | Admitting: Family Medicine

## 2011-08-28 ENCOUNTER — Inpatient Hospital Stay: Payer: Self-pay | Admitting: Orthopedic Surgery

## 2011-08-28 HISTORY — PX: REVISION TOTAL HIP ARTHROPLASTY: SHX766

## 2011-09-02 ENCOUNTER — Other Ambulatory Visit: Payer: 59

## 2011-09-09 ENCOUNTER — Inpatient Hospital Stay: Payer: Self-pay | Admitting: Orthopedic Surgery

## 2011-09-11 ENCOUNTER — Ambulatory Visit: Payer: 59 | Admitting: Family Medicine

## 2011-10-10 ENCOUNTER — Ambulatory Visit (INDEPENDENT_AMBULATORY_CARE_PROVIDER_SITE_OTHER): Payer: 59 | Admitting: Family Medicine

## 2011-10-10 ENCOUNTER — Encounter: Payer: Self-pay | Admitting: Family Medicine

## 2011-10-10 DIAGNOSIS — M79609 Pain in unspecified limb: Secondary | ICD-10-CM

## 2011-10-10 DIAGNOSIS — E119 Type 2 diabetes mellitus without complications: Secondary | ICD-10-CM

## 2011-10-10 DIAGNOSIS — I10 Essential (primary) hypertension: Secondary | ICD-10-CM

## 2011-10-10 DIAGNOSIS — M79606 Pain in leg, unspecified: Secondary | ICD-10-CM

## 2011-10-10 MED ORDER — LUBIPROSTONE 8 MCG PO CAPS: 8.0000 ug | ORAL_CAPSULE | Freq: Two times a day (BID) | ORAL | Status: AC

## 2011-10-10 NOTE — Patient Instructions (Signed)
Follow up DM in 01/2012 with fasting labs prior. Call sooner if issues with leg pain or blood sugar.

## 2011-10-10 NOTE — Progress Notes (Signed)
  Subjective:    Patient ID: Amy Barnes, female    DOB: Aug 10, 1951, 61 y.o.   MRN: [id number]  HPI  61 year old female with left hip revision 11/29.  Had wound infection... Had PICC line placed for home IV antibiotics DORIPENEM IV.  Has wound vac in place. Plan in for 6 months of oral antibiotics following. Using morphine 15 mg BID. Had recently decreased morphine from 30 mg daily.  Nucenta using one tab every 4-6 hours for breakthru pain. Using oin last few days regularly. (Percocet and vicodin not effective)   Shins started to be painful prior to surgery, after surgery pain increased. Left more tender than right. No change in pain to touch. No peripheral swelling. No numbness except occ tingling in great toes. Somewhat better in last few days. Going to physical therapy 2-3 times a week.  FBS 113. Has lost weight since surgery. HAs had 3 episode of dizziness 30 sec. HAs not checked blood sugar. Review of Systems  Constitutional: Negative for fever and fatigue.  HENT: Negative for ear pain.   Eyes: Negative for pain.  Respiratory: Negative for chest tightness and shortness of breath.   Cardiovascular: Negative for chest pain, palpitations and leg swelling.  Gastrointestinal: Negative for abdominal pain.  Genitourinary: Negative for dysuria.       Objective:   Physical Exam  Constitutional: She is oriented to person, place, and time.       Morbidly obese, using walker, in pain from hip In office had episode of lightheadedness from pain and not eating. Improved with pain med and crackers and apple juice.  Cardiovascular: Normal rate, regular rhythm, normal heart sounds and intact distal pulses.  Exam reveals no gallop and no friction rub.   No murmur heard. Pulmonary/Chest: Effort normal and breath sounds normal. No respiratory distress. She has no rales. She exhibits no tenderness.  Abdominal: Soft. Bowel sounds are normal. There is no tenderness. There is no rebound.    Musculoskeletal:       Right knee: Normal.       Left knee: Normal.       Legs:      ttp palpation B legs, nonfocal, left greater than right  No peripheral swelling or redness.  Neurological: She is alert and oriented to person, place, and time. She has normal reflexes.  Skin: Skin is warm and dry. No rash noted.          Assessment & Plan:

## 2011-10-10 NOTE — Assessment & Plan Note (Signed)
Well controlled. Continue current medication.  

## 2011-10-10 NOTE — Assessment & Plan Note (Addendum)
No evidence of infection or DVT. Likely due to deconditioning, morbid obesity.  No red flags.  Pain control per ORTHO. Pt reassured.

## 2011-10-10 NOTE — Assessment & Plan Note (Signed)
Blood sugars currently well controlled. If feeling lightheaded check glucose.

## 2011-10-19 ENCOUNTER — Other Ambulatory Visit: Payer: Self-pay | Admitting: Family Medicine

## 2011-10-23 ENCOUNTER — Other Ambulatory Visit: Payer: Self-pay | Admitting: Family Medicine

## 2011-11-07 ENCOUNTER — Encounter: Payer: Self-pay | Admitting: Family Medicine

## 2011-11-09 ENCOUNTER — Other Ambulatory Visit: Payer: Self-pay | Admitting: Family Medicine

## 2011-11-24 ENCOUNTER — Other Ambulatory Visit: Payer: Self-pay | Admitting: *Deleted

## 2011-11-24 ENCOUNTER — Ambulatory Visit: Payer: Self-pay | Admitting: Orthopedic Surgery

## 2011-11-24 MED ORDER — MONTELUKAST SODIUM 10 MG PO TABS: 10.0000 mg | ORAL_TABLET | Freq: Every day | ORAL | Status: DC

## 2011-12-03 DIAGNOSIS — T8450XA Infection and inflammatory reaction due to unspecified internal joint prosthesis, initial encounter: Secondary | ICD-10-CM | POA: Insufficient documentation

## 2011-12-15 ENCOUNTER — Other Ambulatory Visit: Payer: Self-pay | Admitting: Family Medicine

## 2011-12-16 ENCOUNTER — Ambulatory Visit: Payer: Self-pay | Admitting: Pain Medicine

## 2011-12-16 ENCOUNTER — Other Ambulatory Visit: Payer: Self-pay | Admitting: *Deleted

## 2011-12-16 NOTE — Telephone Encounter (Signed)
Opened in error

## 2011-12-22 ENCOUNTER — Other Ambulatory Visit: Payer: Self-pay | Admitting: Neurosurgery

## 2011-12-29 ENCOUNTER — Encounter (HOSPITAL_COMMUNITY): Payer: Self-pay | Admitting: Respiratory Therapy

## 2012-01-01 ENCOUNTER — Other Ambulatory Visit: Payer: Self-pay | Admitting: *Deleted

## 2012-01-01 MED ORDER — GLUCOSE BLOOD VI STRP: ORAL_STRIP | Status: DC

## 2012-01-02 ENCOUNTER — Ambulatory Visit (HOSPITAL_COMMUNITY)
Admission: RE | Admit: 2012-01-02 | Discharge: 2012-01-02 | Disposition: A | Discharge status: Home / Self Care | Payer: BC Managed Care – PPO | Source: Ambulatory Visit | Attending: Anesthesiology | Admitting: Anesthesiology

## 2012-01-02 ENCOUNTER — Encounter (HOSPITAL_COMMUNITY)
Admission: RE | Admit: 2012-01-02 | Discharge: 2012-01-02 | Disposition: A | Discharge status: Home / Self Care | Payer: BC Managed Care – PPO | Source: Ambulatory Visit | Attending: Neurosurgery | Admitting: Neurosurgery

## 2012-01-02 ENCOUNTER — Encounter (HOSPITAL_COMMUNITY): Payer: Self-pay

## 2012-01-02 DIAGNOSIS — Z01812 Encounter for preprocedural laboratory examination: Secondary | ICD-10-CM | POA: Insufficient documentation

## 2012-01-02 DIAGNOSIS — Z01818 Encounter for other preprocedural examination: Secondary | ICD-10-CM | POA: Insufficient documentation

## 2012-01-02 DIAGNOSIS — I10 Essential (primary) hypertension: Secondary | ICD-10-CM | POA: Insufficient documentation

## 2012-01-02 LAB — CBC
HCT: 32.9 % — ABNORMAL LOW (ref 36.0–46.0)
Hemoglobin: 10.9 g/dL — ABNORMAL LOW (ref 12.0–15.0)
MCH: 27.1 pg (ref 26.0–34.0)
MCHC: 33.1 g/dL (ref 30.0–36.0)
MCV: 81.8 fL (ref 78.0–100.0)
Platelets: 272 10*3/uL (ref 150–400)
RBC: 4.02 MIL/uL (ref 3.87–5.11)
RDW: 15 % (ref 11.5–15.5)
WBC: 7.9 10*3/uL (ref 4.0–10.5)

## 2012-01-02 LAB — ABO/RH: ABO/RH(D): A POS

## 2012-01-02 LAB — BASIC METABOLIC PANEL
BUN: 19 mg/dL (ref 6–23)
CO2: 29 mEq/L (ref 19–32)
Calcium: 10.5 mg/dL (ref 8.4–10.5)
Chloride: 99 mEq/L (ref 96–112)
Creatinine, Ser: 0.68 mg/dL (ref 0.50–1.10)
GFR calc Af Amer: >90 mL/min (ref >90)
GFR calc non Af Amer: >90 mL/min (ref >90)
Glucose, Bld: 96 mg/dL (ref 70–99)
Potassium: 5.3 mEq/L — ABNORMAL HIGH (ref 3.5–5.1)
Sodium: 137 mEq/L (ref 135–145)

## 2012-01-02 LAB — SURGICAL PCR SCREEN
MRSA, PCR: POSITIVE — AB
Staphylococcus aureus: POSITIVE — AB

## 2012-01-02 LAB — TYPE AND SCREEN
ABO/RH(D): A POS
Antibody Screen: NEGATIVE

## 2012-01-02 NOTE — Pre-Procedure Instructions (Signed)
20 SHELMA EIBEN  01/02/2012   Your procedure is scheduled on:  January 08, 2012 at 1025  Report to [facility] at [phone].  Call this number if you have problems the morning of surgery: [phone]   Remember:   Do not eat food:After Midnight.  May have clear liquids: up to 4 Hours before arrival.at 0425  Clear liquids include soda, tea, black coffee, apple or grape juice, broth.  Take these medicines the morning of surgery with A SIP OF WATER: Synthroid, Singular, MS Contin,or Percocet, and Phenergan.M ay use Advair and Albuterol inhalers. Discontinue taking any Aspirin, Coumadin, Plavix, Effient or Herbal meds.  Do not wear jewelry, make-up or nail polish.  Do not wear lotions, powders, or perfumes. You may wear deodorant.  Do not shave 48 hours prior to surgery.  Do not bring valuables to the hospital.  Contacts, dentures or bridgework may not be worn into surgery.  Leave suitcase in the car. After surgery it may be brought to your room.  For patients admitted to the hospital, checkout time is 11:00 AM the day of discharge.   Patients discharged the day of surgery will not be allowed to drive home.    Special Instructions: CHG Shower Use Special Wash: 1/2 bottle night before surgery and 1/2 bottle morning of surgery.   Please read over the following fact sheets that you were given: Pain Booklet, Coughing and Deep Breathing, Blood Transfusion Information, MRSA Information and Surgical Site Infection Prevention

## 2012-01-06 ENCOUNTER — Telehealth: Payer: Self-pay | Admitting: *Deleted

## 2012-01-06 NOTE — Telephone Encounter (Signed)
Pt needs a 3 mth rx for Singulair. [facility]. She has been getting only one mth at a time.

## 2012-01-06 NOTE — Telephone Encounter (Signed)
Spoke with pharmactist and they do have this rx for 3 month supply and 3 refills which was sent on 11-24-2011. Patient advised

## 2012-01-06 NOTE — Telephone Encounter (Signed)
Patient Name: Amy Barnes Call Date & Time: 01/06/2012 9:45:30AM Patient Phone: [phone] PCP: [name] Patient Gender: Female PCP Fax : [phone] Patient DOB: [birth year] Practice Name: [facility] Reason for Call: Pt needs a 3 mth rx for Singulair to [facility]. Med ? Protocol. Protocol(s) Used: Medication Questions - Adult Recommended Outcome per Protocol: Speak with Provider or Pharmacist within 24 hours Reason for Outcome: Requests refill of prescribed medication with valid refills; lack of medications does not put patient at clinical risk Care Advice: ~

## 2012-01-08 ENCOUNTER — Encounter (HOSPITAL_COMMUNITY): Payer: Self-pay | Admitting: *Deleted

## 2012-01-08 ENCOUNTER — Ambulatory Visit (HOSPITAL_COMMUNITY): Payer: BC Managed Care – PPO

## 2012-01-08 ENCOUNTER — Ambulatory Visit (HOSPITAL_COMMUNITY): Payer: BC Managed Care – PPO | Admitting: Anesthesiology

## 2012-01-08 ENCOUNTER — Encounter (HOSPITAL_COMMUNITY): Payer: Self-pay | Admitting: Anesthesiology

## 2012-01-08 ENCOUNTER — Encounter (HOSPITAL_COMMUNITY)
Admission: RE | Disposition: A | Discharge status: Home Health | Payer: Self-pay | Source: Ambulatory Visit | Attending: Neurosurgery

## 2012-01-08 ENCOUNTER — Inpatient Hospital Stay (HOSPITAL_COMMUNITY)
Admission: RE | Admit: 2012-01-08 | Discharge: 2012-01-12 | DRG: 755 | Disposition: A | Discharge status: Home Health | Payer: BC Managed Care – PPO | Source: Ambulatory Visit | Attending: Neurosurgery | Admitting: Neurosurgery

## 2012-01-08 DIAGNOSIS — Z87891 Personal history of nicotine dependence: Secondary | ICD-10-CM

## 2012-01-08 DIAGNOSIS — E119 Type 2 diabetes mellitus without complications: Secondary | ICD-10-CM | POA: Diagnosis present

## 2012-01-08 DIAGNOSIS — Q762 Congenital spondylolisthesis: Principal | ICD-10-CM

## 2012-01-08 DIAGNOSIS — Z882 Allergy status to sulfonamides status: Secondary | ICD-10-CM

## 2012-01-08 DIAGNOSIS — M5137 Other intervertebral disc degeneration, lumbosacral region: Secondary | ICD-10-CM | POA: Diagnosis present

## 2012-01-08 DIAGNOSIS — K219 Gastro-esophageal reflux disease without esophagitis: Secondary | ICD-10-CM | POA: Diagnosis present

## 2012-01-08 DIAGNOSIS — I10 Essential (primary) hypertension: Secondary | ICD-10-CM | POA: Diagnosis present

## 2012-01-08 DIAGNOSIS — Z96649 Presence of unspecified artificial hip joint: Secondary | ICD-10-CM

## 2012-01-08 DIAGNOSIS — Z79899 Other long term (current) drug therapy: Secondary | ICD-10-CM

## 2012-01-08 DIAGNOSIS — J45909 Unspecified asthma, uncomplicated: Secondary | ICD-10-CM | POA: Diagnosis present

## 2012-01-08 DIAGNOSIS — M48062 Spinal stenosis, lumbar region with neurogenic claudication: Secondary | ICD-10-CM | POA: Diagnosis present

## 2012-01-08 DIAGNOSIS — E039 Hypothyroidism, unspecified: Secondary | ICD-10-CM | POA: Diagnosis present

## 2012-01-08 DIAGNOSIS — Z7982 Long term (current) use of aspirin: Secondary | ICD-10-CM

## 2012-01-08 DIAGNOSIS — M4316 Spondylolisthesis, lumbar region: Secondary | ICD-10-CM

## 2012-01-08 DIAGNOSIS — M51379 Other intervertebral disc degeneration, lumbosacral region without mention of lumbar back pain or lower extremity pain: Secondary | ICD-10-CM | POA: Diagnosis present

## 2012-01-08 DIAGNOSIS — E871 Hypo-osmolality and hyponatremia: Secondary | ICD-10-CM | POA: Diagnosis not present

## 2012-01-08 DIAGNOSIS — F319 Bipolar disorder, unspecified: Secondary | ICD-10-CM | POA: Diagnosis present

## 2012-01-08 DIAGNOSIS — Z881 Allergy status to other antibiotic agents status: Secondary | ICD-10-CM

## 2012-01-08 HISTORY — PX: LUMBAR FUSION: SHX111

## 2012-01-08 LAB — GLUCOSE, CAPILLARY
Glucose-Capillary: 121 mg/dL — ABNORMAL HIGH (ref 70–99)
Glucose-Capillary: 213 mg/dL — ABNORMAL HIGH (ref 70–99)
Glucose-Capillary: 178 mg/dL — ABNORMAL HIGH (ref 70–99)

## 2012-01-08 SURGERY — POSTERIOR LUMBAR FUSION 1 LEVEL
Anesthesia: General | Site: Back | Laterality: Bilateral | Wound class: Clean

## 2012-01-08 MED ORDER — LACTATED RINGERS IV SOLN: INTRAVENOUS | Status: DC

## 2012-01-08 MED ORDER — MONTELUKAST SODIUM 10 MG PO TABS
10.0000 mg | ORAL_TABLET | Freq: Every day | ORAL | Status: DC
  Administered 2012-01-08 – 2012-01-11 (×4): 10 mg via ORAL
  Filled 2012-01-08 (×6): qty 1

## 2012-01-08 MED ORDER — GABAPENTIN 300 MG PO CAPS
300.0000 mg | ORAL_CAPSULE | Freq: Three times a day (TID) | ORAL | Status: DC
  Administered 2012-01-08 – 2012-01-12 (×12): 300 mg via ORAL
  Filled 2012-01-08 (×13): qty 1

## 2012-01-08 MED ORDER — BACITRACIN ZINC 500 UNIT/GM EX OINT
TOPICAL_OINTMENT | CUTANEOUS | Status: DC | PRN
  Administered 2012-01-08: 1 via TOPICAL

## 2012-01-08 MED ORDER — ACETAMINOPHEN 650 MG RE SUPP: 650.0000 mg | RECTAL | Status: DC | PRN

## 2012-01-08 MED ORDER — SODIUM CHLORIDE 0.9 % IJ SOLN: 9.0000 mL | INTRAMUSCULAR | Status: DC | PRN

## 2012-01-08 MED ORDER — ONDANSETRON HCL 4 MG/2ML IJ SOLN
INTRAMUSCULAR | Status: DC | PRN
  Administered 2012-01-08: 4 mg via INTRAVENOUS

## 2012-01-08 MED ORDER — ZOLPIDEM TARTRATE 10 MG PO TABS: 10.0000 mg | ORAL_TABLET | Freq: Every evening | ORAL | Status: DC | PRN

## 2012-01-08 MED ORDER — MENTHOL 3 MG MT LOZG
1.0000 | LOZENGE | OROMUCOSAL | Status: DC | PRN
  Filled 2012-01-08: qty 9

## 2012-01-08 MED ORDER — DEXTROSE 5 % IV SOLN
2.0000 g | Freq: Four times a day (QID) | INTRAVENOUS | Status: AC
  Administered 2012-01-08 – 2012-01-09 (×2): 2 g via INTRAVENOUS
  Filled 2012-01-08 (×2): qty 2

## 2012-01-08 MED ORDER — INSULIN ASPART 100 UNIT/ML ~~LOC~~ SOLN
0.0000 [IU] | SUBCUTANEOUS | Status: DC
  Administered 2012-01-08 – 2012-01-09 (×2): 4 [IU] via SUBCUTANEOUS
  Administered 2012-01-09 – 2012-01-10 (×7): 3 [IU] via SUBCUTANEOUS
  Filled 2012-01-08: qty 3

## 2012-01-08 MED ORDER — PROPOFOL 10 MG/ML IV EMUL
INTRAVENOUS | Status: DC | PRN
  Administered 2012-01-08: 150 mg via INTRAVENOUS

## 2012-01-08 MED ORDER — OXYCODONE-ACETAMINOPHEN 5-325 MG PO TABS: 1.0000 | ORAL_TABLET | ORAL | Status: DC | PRN

## 2012-01-08 MED ORDER — NALOXONE HCL 0.4 MG/ML IJ SOLN: 0.4000 mg | INTRAMUSCULAR | Status: DC | PRN

## 2012-01-08 MED ORDER — LAMOTRIGINE 100 MG PO TABS
200.0000 mg | ORAL_TABLET | Freq: Every day | ORAL | Status: DC
  Administered 2012-01-08 – 2012-01-12 (×5): 200 mg via ORAL
  Filled 2012-01-08 (×2): qty 2
  Filled 2012-01-08: qty 1
  Filled 2012-01-08 (×2): qty 2

## 2012-01-08 MED ORDER — LIRAGLUTIDE 18 MG/3ML ~~LOC~~ SOLN
1.2000 mg | Freq: Every day | SUBCUTANEOUS | Status: DC
  Administered 2012-01-10 – 2012-01-11 (×2): via SUBCUTANEOUS
  Administered 2012-01-12: 1.2 mg via SUBCUTANEOUS

## 2012-01-08 MED ORDER — ROCURONIUM BROMIDE 100 MG/10ML IV SOLN
INTRAVENOUS | Status: DC | PRN
  Administered 2012-01-08 (×2): 10 mg via INTRAVENOUS
  Administered 2012-01-08 (×2): 50 mg via INTRAVENOUS
  Administered 2012-01-08: 20 mg via INTRAVENOUS

## 2012-01-08 MED ORDER — OXCARBAZEPINE 300 MG PO TABS
300.0000 mg | ORAL_TABLET | Freq: Every day | ORAL | Status: DC
  Administered 2012-01-09 – 2012-01-12 (×4): 300 mg via ORAL
  Filled 2012-01-08 (×4): qty 1

## 2012-01-08 MED ORDER — ONDANSETRON HCL 4 MG/2ML IJ SOLN
INTRAMUSCULAR | Status: AC
  Filled 2012-01-08: qty 2

## 2012-01-08 MED ORDER — MORPHINE SULFATE (PF) 1 MG/ML IV SOLN
INTRAVENOUS | Status: AC
  Administered 2012-01-08: 16:00:00 via INTRAVENOUS
  Filled 2012-01-08: qty 25

## 2012-01-08 MED ORDER — VANCOMYCIN HCL IN DEXTROSE 1-5 GM/200ML-% IV SOLN
INTRAVENOUS | Status: AC
  Administered 2012-01-08: 1000 mg via INTRAVENOUS
  Filled 2012-01-08: qty 200

## 2012-01-08 MED ORDER — DOCUSATE SODIUM 100 MG PO CAPS: 100.0000 mg | ORAL_CAPSULE | Freq: Two times a day (BID) | ORAL | Status: DC

## 2012-01-08 MED ORDER — DEXTROSE 5 % IV SOLN
INTRAVENOUS | Status: DC | PRN
  Administered 2012-01-08: 11:00:00 via INTRAVENOUS

## 2012-01-08 MED ORDER — MORPHINE SULFATE CR 15 MG PO TB12
15.0000 mg | ORAL_TABLET | Freq: Three times a day (TID) | ORAL | Status: DC
  Administered 2012-01-08 – 2012-01-09 (×4): 15 mg via ORAL
  Filled 2012-01-08 (×4): qty 1

## 2012-01-08 MED ORDER — NEOSTIGMINE METHYLSULFATE 1 MG/ML IJ SOLN
INTRAMUSCULAR | Status: DC | PRN
  Administered 2012-01-08: 4.5 mg via INTRAVENOUS

## 2012-01-08 MED ORDER — LOSARTAN POTASSIUM 50 MG PO TABS
50.0000 mg | ORAL_TABLET | Freq: Every day | ORAL | Status: DC
  Administered 2012-01-08 – 2012-01-12 (×5): 50 mg via ORAL
  Filled 2012-01-08 (×5): qty 1

## 2012-01-08 MED ORDER — PHENOL 1.4 % MT LIQD: 1.0000 | OROMUCOSAL | Status: DC | PRN

## 2012-01-08 MED ORDER — DIAZEPAM 5 MG PO TABS
5.0000 mg | ORAL_TABLET | Freq: Four times a day (QID) | ORAL | Status: DC | PRN
  Administered 2012-01-08 – 2012-01-11 (×6): 5 mg via ORAL
  Filled 2012-01-08 (×6): qty 1

## 2012-01-08 MED ORDER — SODIUM CHLORIDE 0.9 % IV SOLN
INTRAVENOUS | Status: AC
  Filled 2012-01-08: qty 500

## 2012-01-08 MED ORDER — HYDROMORPHONE HCL PF 1 MG/ML IJ SOLN: 0.2500 mg | INTRAMUSCULAR | Status: DC | PRN

## 2012-01-08 MED ORDER — MIDAZOLAM HCL 2 MG/2ML IJ SOLN
INTRAMUSCULAR | Status: AC
  Filled 2012-01-08: qty 2

## 2012-01-08 MED ORDER — VANCOMYCIN HCL 1000 MG IV SOLR
1500.0000 mg | INTRAVENOUS | Status: DC
  Filled 2012-01-08: qty 1500

## 2012-01-08 MED ORDER — PROMETHAZINE HCL 25 MG/ML IJ SOLN
INTRAMUSCULAR | Status: AC
  Filled 2012-01-08: qty 1

## 2012-01-08 MED ORDER — FENTANYL CITRATE 0.05 MG/ML IJ SOLN
INTRAMUSCULAR | Status: DC | PRN
  Administered 2012-01-08: 150 ug via INTRAVENOUS
  Administered 2012-01-08: 100 ug via INTRAVENOUS
  Administered 2012-01-08 (×2): 50 ug via INTRAVENOUS
  Administered 2012-01-08 (×2): 100 ug via INTRAVENOUS
  Administered 2012-01-08 (×3): 50 ug via INTRAVENOUS
  Administered 2012-01-08: 100 ug via INTRAVENOUS

## 2012-01-08 MED ORDER — DEXTROSE 5 % IV SOLN
2.0000 g | INTRAVENOUS | Status: AC
  Administered 2012-01-08 (×2): 2 g via INTRAVENOUS
  Filled 2012-01-08 (×2): qty 2

## 2012-01-08 MED ORDER — PROMETHAZINE HCL 25 MG PO TABS: 25.0000 mg | ORAL_TABLET | Freq: Four times a day (QID) | ORAL | Status: DC | PRN

## 2012-01-08 MED ORDER — FLUTICASONE-SALMETEROL 115-21 MCG/ACT IN AERO
2.0000 | INHALATION_SPRAY | Freq: Every day | RESPIRATORY_TRACT | Status: DC
  Filled 2012-01-08 (×4): qty 8

## 2012-01-08 MED ORDER — PROMETHAZINE HCL 25 MG/ML IJ SOLN
6.2500 mg | Freq: Once | INTRAMUSCULAR | Status: AC
  Administered 2012-01-08: 6.25 mg via INTRAVENOUS

## 2012-01-08 MED ORDER — SIMVASTATIN 40 MG PO TABS
40.0000 mg | ORAL_TABLET | Freq: Every evening | ORAL | Status: DC
  Administered 2012-01-09 – 2012-01-12 (×4): 40 mg via ORAL
  Filled 2012-01-08 (×5): qty 1

## 2012-01-08 MED ORDER — LEVOTHYROXINE SODIUM 150 MCG PO TABS
150.0000 ug | ORAL_TABLET | Freq: Every day | ORAL | Status: DC
  Administered 2012-01-08 – 2012-01-12 (×5): 150 ug via ORAL
  Filled 2012-01-08 (×5): qty 1

## 2012-01-08 MED ORDER — OXYCODONE HCL 5 MG PO TABS
5.0000 mg | ORAL_TABLET | ORAL | Status: DC | PRN
  Administered 2012-01-08 – 2012-01-11 (×5): 10 mg via ORAL
  Filled 2012-01-08 (×5): qty 2

## 2012-01-08 MED ORDER — BACITRACIN 50000 UNITS IM SOLR
INTRAMUSCULAR | Status: DC | PRN
  Administered 2012-01-08: 12:00:00

## 2012-01-08 MED ORDER — OXCARBAZEPINE 300 MG PO TABS
600.0000 mg | ORAL_TABLET | Freq: Every day | ORAL | Status: DC
  Administered 2012-01-08 – 2012-01-11 (×4): 600 mg via ORAL
  Filled 2012-01-08 (×6): qty 2

## 2012-01-08 MED ORDER — OXYCODONE-ACETAMINOPHEN 5-325 MG PO TABS
1.0000 | ORAL_TABLET | ORAL | Status: DC | PRN
  Administered 2012-01-10 – 2012-01-11 (×3): 2 via ORAL
  Filled 2012-01-08 (×3): qty 2

## 2012-01-08 MED ORDER — MORPHINE SULFATE (PF) 1 MG/ML IV SOLN
INTRAVENOUS | Status: DC
  Administered 2012-01-08: 20 mg via INTRAVENOUS
  Administered 2012-01-08: 16:00:00 via INTRAVENOUS
  Administered 2012-01-08: 19.5 mg via INTRAVENOUS
  Administered 2012-01-09: 32.85 mg via INTRAVENOUS
  Administered 2012-01-09: 1.6 mg via INTRAVENOUS
  Administered 2012-01-09: 1.2 mg via INTRAVENOUS
  Administered 2012-01-09: 29.5 mg via INTRAVENOUS
  Administered 2012-01-10: 12 mg via INTRAVENOUS

## 2012-01-08 MED ORDER — METFORMIN HCL ER 500 MG PO TB24
1000.0000 mg | ORAL_TABLET | Freq: Every day | ORAL | Status: DC
  Administered 2012-01-09 – 2012-01-12 (×4): 1000 mg via ORAL
  Filled 2012-01-08 (×8): qty 2

## 2012-01-08 MED ORDER — MIDAZOLAM HCL 2 MG/2ML IJ SOLN
2.0000 mg | Freq: Once | INTRAMUSCULAR | Status: AC
  Administered 2012-01-08: 1 mg via INTRAVENOUS

## 2012-01-08 MED ORDER — ONDANSETRON HCL 4 MG/2ML IJ SOLN: 4.0000 mg | Freq: Four times a day (QID) | INTRAMUSCULAR | Status: DC | PRN

## 2012-01-08 MED ORDER — 0.9 % SODIUM CHLORIDE (POUR BTL) OPTIME
TOPICAL | Status: DC | PRN
  Administered 2012-01-08: 1000 mL

## 2012-01-08 MED ORDER — CEFAZOLIN SODIUM-DEXTROSE 2-3 GM-% IV SOLR
INTRAVENOUS | Status: AC
  Filled 2012-01-08: qty 50

## 2012-01-08 MED ORDER — ALBUTEROL SULFATE HFA 108 (90 BASE) MCG/ACT IN AERS
2.0000 | INHALATION_SPRAY | Freq: Four times a day (QID) | RESPIRATORY_TRACT | Status: DC | PRN
  Filled 2012-01-08: qty 6.7

## 2012-01-08 MED ORDER — THROMBIN 20000 UNITS EX KIT
PACK | CUTANEOUS | Status: DC | PRN
  Administered 2012-01-08: 12:00:00 via TOPICAL

## 2012-01-08 MED ORDER — DIPHENHYDRAMINE HCL 50 MG/ML IJ SOLN: 12.5000 mg | Freq: Four times a day (QID) | INTRAMUSCULAR | Status: DC | PRN

## 2012-01-08 MED ORDER — ACETAMINOPHEN 325 MG PO TABS
650.0000 mg | ORAL_TABLET | ORAL | Status: DC | PRN
  Administered 2012-01-08 – 2012-01-12 (×4): 650 mg via ORAL
  Filled 2012-01-08 (×4): qty 2

## 2012-01-08 MED ORDER — GLYCOPYRROLATE 0.2 MG/ML IJ SOLN
INTRAMUSCULAR | Status: DC | PRN
  Administered 2012-01-08: .6 mg via INTRAVENOUS

## 2012-01-08 MED ORDER — VANCOMYCIN HCL 1000 MG IV SOLR
1500.0000 mg | Freq: Once | INTRAVENOUS | Status: AC
  Administered 2012-01-09: 1500 mg via INTRAVENOUS
  Filled 2012-01-08 (×2): qty 1500

## 2012-01-08 MED ORDER — HYDROMORPHONE HCL PF 1 MG/ML IJ SOLN
0.2500 mg | INTRAMUSCULAR | Status: DC | PRN
  Administered 2012-01-08 (×4): 0.5 mg via INTRAVENOUS

## 2012-01-08 MED ORDER — SUCCINYLCHOLINE CHLORIDE 20 MG/ML IJ SOLN
INTRAMUSCULAR | Status: DC | PRN
  Administered 2012-01-08: 140 mg via INTRAVENOUS

## 2012-01-08 MED ORDER — DIPHENHYDRAMINE HCL 12.5 MG/5ML PO ELIX
12.5000 mg | ORAL_SOLUTION | Freq: Four times a day (QID) | ORAL | Status: DC | PRN
  Filled 2012-01-08: qty 5

## 2012-01-08 MED ORDER — MIDAZOLAM HCL 5 MG/5ML IJ SOLN
INTRAMUSCULAR | Status: DC | PRN
  Administered 2012-01-08: 2 mg via INTRAVENOUS

## 2012-01-08 MED ORDER — HYDROMORPHONE HCL PF 1 MG/ML IJ SOLN
INTRAMUSCULAR | Status: AC
  Administered 2012-01-08: 0.5 mg
  Filled 2012-01-08: qty 1

## 2012-01-08 MED ORDER — BUPIVACAINE-EPINEPHRINE PF 0.5-1:200000 % IJ SOLN
INTRAMUSCULAR | Status: DC | PRN
  Administered 2012-01-08: 10 mL

## 2012-01-08 MED ORDER — BACITRACIN 50000 UNITS IM SOLR
INTRAMUSCULAR | Status: AC
  Filled 2012-01-08: qty 1

## 2012-01-08 MED ORDER — LACTATED RINGERS IV SOLN
INTRAVENOUS | Status: DC | PRN
  Administered 2012-01-08 (×2): via INTRAVENOUS

## 2012-01-08 MED ORDER — ONDANSETRON HCL 4 MG/2ML IJ SOLN
4.0000 mg | INTRAMUSCULAR | Status: DC | PRN
  Administered 2012-01-08: 4 mg via INTRAVENOUS

## 2012-01-08 MED ORDER — CLONAZEPAM 0.5 MG PO TABS
0.5000 mg | ORAL_TABLET | Freq: Every evening | ORAL | Status: DC | PRN
  Administered 2012-01-08: 0.5 mg via ORAL
  Filled 2012-01-08: qty 1

## 2012-01-08 MED ORDER — DOCUSATE SODIUM 100 MG PO CAPS
300.0000 mg | ORAL_CAPSULE | Freq: Every day | ORAL | Status: DC
  Administered 2012-01-08 – 2012-01-11 (×4): 300 mg via ORAL
  Filled 2012-01-08: qty 1
  Filled 2012-01-08 (×2): qty 3
  Filled 2012-01-08 (×2): qty 2

## 2012-01-08 MED ORDER — MORPHINE SULFATE (PF) 1 MG/ML IV SOLN
INTRAVENOUS | Status: AC
  Administered 2012-01-08: 20:00:00
  Filled 2012-01-08: qty 25

## 2012-01-08 MED ORDER — OXCARBAZEPINE 300 MG PO TABS
300.0000 mg | ORAL_TABLET | Freq: Two times a day (BID) | ORAL | Status: DC
  Filled 2012-01-08: qty 2

## 2012-01-08 MED ORDER — HYDROMORPHONE HCL PF 1 MG/ML IJ SOLN
INTRAMUSCULAR | Status: AC
  Filled 2012-01-08: qty 1

## 2012-01-08 SURGICAL SUPPLY — 67 items
APL SKNCLS STERI-STRIP NONHPOA (GAUZE/BANDAGES/DRESSINGS) ×1
BAG DECANTER FOR FLEXI CONT (MISCELLANEOUS) ×2 IMPLANT
BENZOIN TINCTURE PRP APPL 2/3 (GAUZE/BANDAGES/DRESSINGS) ×2 IMPLANT
BLADE SURG ROTATE 9660 (MISCELLANEOUS) IMPLANT
BRUSH SCRUB EZ PLAIN DRY (MISCELLANEOUS) ×2 IMPLANT
BUR ACORN 6.0 (BURR) ×2 IMPLANT
BUR MATCHSTICK NEURO 3.0 LAGG (BURR) ×2 IMPLANT
CANISTER SUCTION 2500CC (MISCELLANEOUS) ×2 IMPLANT
CLOTH BEACON ORANGE TIMEOUT ST (SAFETY) ×2 IMPLANT
CONSTRUCT LV 1 SPINEWAGE (Spine Construct) ×1 IMPLANT
CONT SPEC 4OZ CLIKSEAL STRL BL (MISCELLANEOUS) ×2 IMPLANT
COVER BACK TABLE 24X17X13 BIG (DRAPES) IMPLANT
COVER TABLE BACK 60X90 (DRAPES) ×2 IMPLANT
DRAPE C-ARM 42X72 X-RAY (DRAPES) ×4 IMPLANT
DRAPE LAPAROTOMY 100X72X124 (DRAPES) ×2 IMPLANT
DRAPE POUCH INSTRU U-SHP 10X18 (DRAPES) ×2 IMPLANT
DRAPE PROXIMA HALF (DRAPES) ×1 IMPLANT
DRAPE SURG 17X23 STRL (DRAPES) ×8 IMPLANT
ELECT BLADE 4.0 EZ CLEAN MEGAD (MISCELLANEOUS) ×2
ELECT REM PT RETURN 9FT ADLT (ELECTROSURGICAL) ×2
ELECTRODE BLDE 4.0 EZ CLN MEGD (MISCELLANEOUS) ×1 IMPLANT
ELECTRODE REM PT RTRN 9FT ADLT (ELECTROSURGICAL) ×1 IMPLANT
GAUZE SPONGE 4X4 16PLY XRAY LF (GAUZE/BANDAGES/DRESSINGS) ×2 IMPLANT
GLOVE BIO SURGEON STRL SZ8.5 (GLOVE) ×4 IMPLANT
GLOVE BIOGEL PI IND STRL 7.0 (GLOVE) IMPLANT
GLOVE BIOGEL PI IND STRL 7.5 (GLOVE) IMPLANT
GLOVE BIOGEL PI INDICATOR 7.0 (GLOVE) ×3
GLOVE BIOGEL PI INDICATOR 7.5 (GLOVE) ×1
GLOVE ECLIPSE 7.5 STRL STRAW (GLOVE) ×1 IMPLANT
GLOVE EXAM NITRILE LRG STRL (GLOVE) IMPLANT
GLOVE EXAM NITRILE MD LF STRL (GLOVE) ×2 IMPLANT
GLOVE EXAM NITRILE XL STR (GLOVE) IMPLANT
GLOVE EXAM NITRILE XS STR PU (GLOVE) IMPLANT
GLOVE SS BIOGEL STRL SZ 6.5 (GLOVE) IMPLANT
GLOVE SS BIOGEL STRL SZ 8 (GLOVE) ×2 IMPLANT
GLOVE SUPERSENSE BIOGEL SZ 6.5 (GLOVE) ×4
GLOVE SUPERSENSE BIOGEL SZ 8 (GLOVE) ×2
GOWN BRE IMP SLV AUR LG STRL (GOWN DISPOSABLE) ×1 IMPLANT
GOWN BRE IMP SLV AUR XL STRL (GOWN DISPOSABLE) ×6 IMPLANT
GOWN STRL REIN 2XL LVL4 (GOWN DISPOSABLE) IMPLANT
KIT BASIN OR (CUSTOM PROCEDURE TRAY) ×2 IMPLANT
KIT ROOM TURNOVER OR (KITS) ×2 IMPLANT
NDL HYPO 21X1.5 SAFETY (NEEDLE) IMPLANT
NEEDLE HYPO 21X1.5 SAFETY (NEEDLE) IMPLANT
NEEDLE HYPO 22GX1.5 SAFETY (NEEDLE) ×2 IMPLANT
NS IRRIG 1000ML POUR BTL (IV SOLUTION) ×2 IMPLANT
PACK FOAM VITOSS 10CC (Orthopedic Implant) ×1 IMPLANT
PACK LAMINECTOMY NEURO (CUSTOM PROCEDURE TRAY) ×2 IMPLANT
PAD ARMBOARD 7.5X6 YLW CONV (MISCELLANEOUS) ×6 IMPLANT
PATTIES SURGICAL .5 X1 (DISPOSABLE) ×1 IMPLANT
PATTIES SURGICAL 1X1 (DISPOSABLE) ×1 IMPLANT
PUTTY 10ML ACTIFUSE ABX (Putty) ×1 IMPLANT
SPONGE GAUZE 4X4 12PLY (GAUZE/BANDAGES/DRESSINGS) ×2 IMPLANT
SPONGE LAP 4X18 X RAY DECT (DISPOSABLE) IMPLANT
SPONGE NEURO XRAY DETECT 1X3 (DISPOSABLE) IMPLANT
SPONGE SURGIFOAM ABS GEL 100 (HEMOSTASIS) ×2 IMPLANT
STRIP CLOSURE SKIN 1/2X4 (GAUZE/BANDAGES/DRESSINGS) ×2 IMPLANT
SUT VIC AB 1 CT1 18XBRD ANBCTR (SUTURE) ×2 IMPLANT
SUT VIC AB 1 CT1 8-18 (SUTURE) ×4
SUT VIC AB 2-0 CP2 18 (SUTURE) ×4 IMPLANT
SYR 20CC LL (SYRINGE) IMPLANT
SYR 20ML ECCENTRIC (SYRINGE) ×2 IMPLANT
TAPE CLOTH SURG 4X10 WHT LF (GAUZE/BANDAGES/DRESSINGS) ×1 IMPLANT
TOWEL OR 17X24 6PK STRL BLUE (TOWEL DISPOSABLE) ×2 IMPLANT
TOWEL OR 17X26 10 PK STRL BLUE (TOWEL DISPOSABLE) ×2 IMPLANT
TRAY FOLEY CATH 14FRSI W/METER (CATHETERS) ×2 IMPLANT
WATER STERILE IRR 1000ML POUR (IV SOLUTION) ×2 IMPLANT

## 2012-01-08 NOTE — Anesthesia Preprocedure Evaluation (Addendum)
Anesthesia Evaluation  Patient identified by MRN, date of birth, ID band Patient awake    Reviewed: Allergy & Precautions, H&P , NPO status , Patient's Chart, lab work & pertinent test results  Airway Mallampati: II TM Distance: >3 FB Neck ROM: Full    Dental No notable dental hx. (+) Teeth Intact and Dental Advisory Given   Pulmonary asthma , sleep apnea and Continuous Positive Airway Pressure Ventilation ,  breath sounds clear to auscultation  Pulmonary exam normal       Cardiovascular hypertension, On Medications Rhythm:Regular Rate:Normal     Neuro/Psych PSYCHIATRIC DISORDERS Depression Bipolar Disorder  Neuromuscular disease    GI/Hepatic Neg liver ROS, GERD-  Medicated and Controlled,  Endo/Other  Diabetes mellitus-, Well Controlled, Type 2, Oral Hypoglycemic AgentsHypothyroidism Morbid obesity  Renal/GU negative Renal ROS  negative genitourinary   Musculoskeletal  (+) Arthritis -, Osteoarthritis,    Abdominal (+) + obese,   Peds  Hematology negative hematology ROS (+)   Anesthesia Other Findings   Reproductive/Obstetrics negative OB ROS                          Anesthesia Physical Anesthesia Plan  ASA: III  Anesthesia Plan: General   Post-op Pain Management:    Induction: Intravenous  Airway Management Planned: Oral ETT  Additional Equipment:   Intra-op Plan:   Post-operative Plan: Extubation in OR  Informed Consent: I have reviewed the patients History and Physical, chart, labs and discussed the procedure including the risks, benefits and alternatives for the proposed anesthesia with the patient or authorized representative who has indicated his/her understanding and acceptance.   Dental advisory given  Plan Discussed with: CRNA, Anesthesiologist and Surgeon  Anesthesia Plan Comments:        Anesthesia Quick Evaluation

## 2012-01-08 NOTE — Transfer of Care (Signed)
Immediate Anesthesia Transfer of Care Note  Patient: Amy Barnes  Procedure(s) Performed: Procedure(s) (LRB): POSTERIOR LUMBAR FUSION 1 LEVEL (Bilateral)  Patient Location: PACU  Anesthesia Type: General  Level of Consciousness: awake, alert  and oriented  Airway & Oxygen Therapy: Patient Spontanous Breathing and Patient connected to nasal cannula oxygen  Post-op Assessment: Report given to PACU RN and Post -op Vital signs reviewed and stable  Post vital signs: Reviewed and stable  Complications: No apparent anesthesia complications

## 2012-01-08 NOTE — Progress Notes (Signed)
Call fr. Pat in Neuro OR, order given by Dr. Lovell Sheehan to give Azactam 2 gm IV with Vancomycin

## 2012-01-08 NOTE — Progress Notes (Signed)
Subjective:  The patient is alert and pleasant. Her back is appropriately sore.  Objective: Vital signs in last 24 hours: Temp:  [98.1 F (36.7 C)-98.9 F (37.2 C)] 98.9 F (37.2 C) (04/11 1445) Pulse Rate:  [77-112] 112  (04/11 1445) Resp:  [18-28] 28  (04/11 1445) BP: (153-169)/(82-104) 169/91 mmHg (04/11 1445) SpO2:  [95 %-97 %] 97 % (04/11 1445)  Intake/Output from previous day:   Intake/Output this shift: Total I/O In: 2200 [I.V.:2200] Out: 1065 [Urine:740; Blood:325]  Physical exam the patient is alert and oriented. She is moving all 4 extremities.  Lab Results: No results found for this basename: WBC:2,HGB:2,HCT:2,PLT:2 in the last 72 hours BMET No results found for this basename: NA:2,K:2,CL:2,CO2:2,GLUCOSE:2,BUN:2,CREATININE:2,CALCIUM:2 in the last 72 hours  Studies/Results: Dg Lumbar Spine 1 View  01/08/2012  *RADIOLOGY REPORT*  Clinical Data: L3-4 posterior fusion.  LUMBAR SPINE - 1 VIEW  Comparison: Outside studies 12/09/2011  Findings: Study is limited by body habitus.  It is difficult to visualize the lower lumbar spine.  Post surgical instruments are directed at the lumbar level with slight anterolisthesis and vacuum disc.  When compared to the prior study, this appears to be L3-4.  IMPRESSION: Limited study by body habitus.  There appears to be localization of L3-4 as described above.  Original Report Authenticated By: Cyndie Chime, M.D.    Assessment/Plan: Patient is doing well.  LOS: 0 days     Kalla Watson D 01/08/2012, 3:09 PM

## 2012-01-08 NOTE — Op Note (Signed)
Brief history: The patient is a 61 year old Amy Barnes female who has had chronic back and leg pain. She has failed medical management and was worked up with a lumbar MRI. This demonstrated the patient had a spondylolisthesis and spinal stenosis with severe facet arthropathy at L3-4. I discussed the various treatment options with the patient including surgery. The patient has weighed the risks, benefits, and alternatives surgery and elect proceed with the L3-4 decompression instrumentation and fusion.  Preoperative diagnosis: L3-4 spondylolisthesis, facet arthropathy, Degenerative disc disease, spinal stenosis; lumbago; lumbar radiculopathy  Postoperative diagnosis: the same  Procedure: L3 Laminotomy/foraminotomies to decompress the bilateral L3 and L4 nerve roots(the work required to do this was in addition to the work required to do the posterior lumbar interbody fusion because of the patient's spinal stenosis, facet arthropathy. Etc. requiring a wide decompression of the nerve roots.); L3-4 posterior lumbar interbody fusion with local morselized autograft bone and Actifusebone graft extender; insertion of interbody prosthesis at L3-4 (spine wave peek interbody prosthesis); posterior nonsegmental instrumentation from L3 to L4 with spine wave titanium pedicle screws and rods; posterior lateral arthrodesis at L3-4 with local morselized autograft bone and Vitoss bone graft extender.  Surgeon: Dr. Delma Officer  Asst.: Dr. Aliene Beams  Anesthesia: Gen. endotracheal  Estimated blood loss: 250 cc  Drains: None  Locations: None  Description of procedure: The patient was brought to the operating room by the anesthesia team. General endotracheal anesthesia was induced. The patient was turned to the prone position on the Wilson frame. The patient's lumbosacral region was then prepared with Betadine scrub and Betadine solution. Sterile drapes were applied.  I then injected the area to be incised with  Marcaine with epinephrine solution. I then used the scalpel to make a linear midline incision over the L3-4 interspace. I then used electrocautery to perform a bilateral subperiosteal dissection exposing the spinous process and lamina of L2-L3 and L4. We then obtained intraoperative radiograph to confirm our location. We then inserted the Verstrac retractor to provide exposure.  I began the decompression by using the high speed drill to perform laminotomies at L3. We then used the Kerrison punches to widen the laminotomy and removed the ligamentum flavum at L3-4. We used the Kerrison punches to remove the medial facets at L3-4. We performed wide foraminotomies about the bilateral L3 and L4 nerve roots completing the decompression.  We now turned our attention to the posterior lumbar interbody fusion. I used a scalpel to incise the intervertebral disc at L3-4. I then performed a partial intervertebral discectomy at L3-4 using the pituitary forceps. We prepared the vertebral endplates at L3-4 for the fusion by removing the soft tissues with the curettes. We then used the trial spacers to pick the appropriate sized interbody prosthesis. We prefilled his prosthesis with a combination of local morselized autograft bone that we obtained during the decompression as well as Actifuse bone graft extender. We inserted the prefilled prosthesis into the interspace at L3-4. There was a good snug fit of the prosthesis in the interspace. We then filled and the remainder of the intervertebral disc space with local morselized autograft bone and Actifuse. This completed the posterior lumbar interbody arthrodesis.  We now turned attention to the instrumentation. Under fluoroscopic guidance we cannulated the bilateral L3 and L4 pedicles with the bone probe. We then removed the bone probe. He then tapped the pedicle with a 6.5 millimeter tap. We then removed the tap. We probed inside the tapped pedicle with a ball probe to rule  out  cortical breaches. We then inserted a 7.5 x 50 millimeter pedicle screw into the L3 and L4 pedicles bilaterally under fluoroscopic guidance. We then palpated along the medial aspect of the pedicles to rule out cortical breaches. There were none. The nerve roots were not injured. We then connected the unilateral pedicle screws with a lordotic rod. We compressed the construct and secured the rod in place with the caps. We then tightened the caps appropriately. This completed the instrumentation from L3-L4.  We now turned our attention to the posterior lateral arthrodesis at L3-4. We used the high-speed drill to decorticate the remainder of the facets, pars, transverse process at L3 and L4. We then applied a combination of local morselized autograft bone and Vitoss bone graft extender over these decorticated posterior lateral structures. This completed the posterior lateral arthrodesis.  We then obtained hemostasis using bipolar electrocautery. We irrigated the wound out with bacitracin solution. We inspected the thecal sac and nerve roots and noted they were well decompressed. We then removed the retractor. We reapproximated patient's thoracolumbar fascia with interrupted #1 Vicryl suture. We reapproximated patient's subcutaneous tissue with interrupted 2-0 Vicryl suture. The reapproximated patient's skin with Steri-Strips and benzoin. The wound was then coated with bacitracin ointment. A sterile dressing was applied. The drapes were removed. The patient was subsequently returned to the supine position where they were extubated by the anesthesia team. He was then transported to the post anesthesia care unit in stable condition. All sponge instrument and needle counts were correct at the end of this case.

## 2012-01-08 NOTE — Progress Notes (Signed)
ANTIBIOTIC CONSULT NOTE - INITIAL  Pharmacy Consult for vancomycin Indication: post-op sugical prophylaxis  Allergies  Allergen Reactions  . Keflex Hives  . Sulfonamide Derivatives     REACTION: Rash    Patient Measurements:   Actual Body Weight: 161kg  Labs: Scr 0.68 on 01/02/12   Microbiology: Recent Results (from the past 720 hour(s))  SURGICAL PCR SCREEN     Status: Abnormal   Collection Time   01/02/12  3:59 PM      Component Value Range Status Comment   MRSA, PCR POSITIVE (*) NEGATIVE  Final    Staphylococcus aureus POSITIVE (*) NEGATIVE  Final     Medical History: Past Medical History  Diagnosis Date  . Anemia   . Osteoarthritis   . Asthma   . Diabetes mellitus   . Hypothyroid   . GERD (gastroesophageal reflux disease)   . Allergic rhinitis   . Hypertension   . Bipolar disorder     takes Synthroid meds for Bipolar   Assessment: 79 yof s/p lumbar fusion surgery. Pharmacy consulted for post-op prophylactic vancomycin dose. Renal function is excellent. No drain present post-op.  Plan:  Vancomycin 1500mg  IV x 1 Aztreonam 2g q 6 x2  Pharmacy to sign off.  Thank you, please reconsult if needs arise.  Severiano Gilbert 01/08/2012,7:56 PM

## 2012-01-08 NOTE — H&P (Signed)
Subjective: The patient is a 61 year old Rauh female who's had chronic back and left hip and leg pain. She has failed medical management was worked up with a lumbar MRI. This demonstrated patient had spinal stenosis  at L3-4. I discussed the various treatment options with the patient including surgery. She has weighed the risks, benefits, and alternatives surgery service he with a L3-4 decompression instrumentation and fusion.   Past Medical History  Diagnosis Date  . Anemia   . Osteoarthritis   . Asthma   . Diabetes mellitus   . Hypothyroid   . GERD (gastroesophageal reflux disease)   . Allergic rhinitis   . Hypertension   . Bipolar disorder     takes Synthroid meds for Bipolar    Past Surgical History  Procedure Date  . Total hip arthroplasty 2002    Left  . Partial hysterectomy 1982    for mennorhagia, no cervix  . Foot surgery 1982    bone spur  . Neck surgery     Herniated disk C2,3,4  . Tmj arthroplasty 1982  . Hospitalized     for bipolar  . Abdominal hysterectomy   . Joint replacement     2002  . Carpal tunnel release 2002    Allergies  Allergen Reactions  . Keflex Hives  . Sulfonamide Derivatives     REACTION: Rash    History  Substance Use Topics  . Smoking status: Former Smoker -- 1.5 packs/day    Types: Cigarettes    Quit date: 04/29/2009  . Smokeless tobacco: Never Used  . Alcohol Use: Yes     very rare    Family History  Problem Relation Age of Onset  . Aneurysm Father   . Alcohol abuse Father   . Heart attack      Siblings  . Coronary artery disease      Family history 1st degree relative <50/Family history 1st degree relative <60/Siblings  . Anesthesia problems Neg Hx   . Hypotension Neg Hx   . Malignant hyperthermia Neg Hx   . Pseudochol deficiency Neg Hx    Prior to Admission medications   Medication Sig Start Date End Date Taking? Authorizing Provider  albuterol (PROVENTIL,VENTOLIN) 90 MCG/ACT inhaler Inhale 2 puffs into the lungs  every 6 (six) hours as needed. 01/31/11  Yes [name], MD  aspirin 81 MG EC tablet Take 81 mg by mouth daily.     Yes Historical Provider, MD  Calcium Carbonate-Vitamin D (CALCIUM + D PO) Take 1 tablet by mouth daily.   Yes Historical Provider, MD  celecoxib (CELEBREX) 200 MG capsule Take 1 capsule (200 mg total) by mouth 2 (two) times daily. 03/25/11  Yes Amy Michelle Nasuti, MD  ciprofloxacin (CIPRO) 750 MG tablet Take 750 mg by mouth 2 (two) times daily.   Yes Historical Provider, MD  cyclobenzaprine (FLEXERIL) 10 MG tablet Take 10 mg by mouth 3 (three) times daily as needed. For muscle pain   Yes Historical Provider, MD  docusate sodium (COLACE) 100 MG capsule Take 300 mg by mouth at bedtime.   Yes Historical Provider, MD  fluticasone-salmeterol (ADVAIR HFA) 115-21 MCG/ACT inhaler Inhale 2 puffs into the lungs daily.     Yes Historical Provider, MD  furosemide (LASIX) 80 MG tablet Take 0.5 tablets (40 mg total) by mouth 2 (two) times daily as needed. 03/25/11  Yes Amy Michelle Nasuti, MD  gabapentin (NEURONTIN) 300 MG capsule Take 300 mg by mouth 3 (three) times daily.   Yes Historical  Provider, MD  glucose blood test strip Use as instructed 01/01/12 12/31/12 Yes Amy E Bedsole, MD  IRON PO Take 75 mg by mouth daily.   Yes Historical Provider, MD  lamoTRIgine (LAMICTAL) 200 MG tablet Take 200 mg by mouth daily.     Yes Historical Provider, MD  levothyroxine (SYNTHROID, LEVOTHROID) 150 MCG tablet Take 150 mcg by mouth daily.   Yes Historical Provider, MD  Liraglutide (VICTOZA) 18 MG/3ML SOLN Inject 1.2 mg into the skin daily.   Yes Historical Provider, MD  losartan (COZAAR) 50 MG tablet Take 50 mg by mouth daily.   Yes Historical Provider, MD  metFORMIN (GLUCOPHAGE-XR) 500 MG 24 hr tablet Take 1,000 mg by mouth daily with breakfast.   Yes Historical Provider, MD  montelukast (SINGULAIR) 10 MG tablet Take 1 tablet (10 mg total) by mouth at bedtime. 11/24/11  Yes Amy Michelle Nasuti, MD  morphine (MS CONTIN) 15 MG 12  hr tablet Take 15 mg by mouth 3 (three) times daily.    Yes Historical Provider, MD  Multiple Vitamins-Minerals (CENTRUM SILVER PO) Take 1 tablet by mouth daily.   Yes Historical Provider, MD  Oxcarbazepine (TRILEPTAL) 300 MG tablet Take 300-600 mg by mouth 2 (two) times daily. 1 in the morning and 2 every evening   Yes Historical Provider, MD  oxyCODONE-acetaminophen (PERCOCET) 10-325 MG per tablet Take 1-2 tablets by mouth every 4 (four) hours as needed. 06/19/11  Yes Amy Michelle Nasuti, MD  promethazine (PHENERGAN) 25 MG tablet Take 25 mg by mouth every 6 (six) hours as needed.   Yes Historical Provider, MD  simvastatin (ZOCOR) 40 MG tablet Take 40 mg by mouth every evening.   Yes Historical Provider, MD  Tapentadol HCl (NUCYNTA) 75 MG TABS Take 1 tablet by mouth 2 (two) times daily.   Yes Historical Provider, MD  clonazePAM (KLONOPIN) 0.5 MG tablet Take 0.5 mg by mouth at bedtime as needed. For sleep    Historical Provider, MD     Review of Systems  Positive ROS: As above  All other systems have been reviewed and were otherwise negative with the exception of those mentioned in the HPI and as above.  Objective: Vital signs in last 24 hours: Temp:  [98.1 F (36.7 C)] 98.1 F (36.7 C) (04/11 0821) Pulse Rate:  [77] 77  (04/11 0821) Resp:  [18] 18  (04/11 0821) BP: (153-157)/(82-104) 157/82 mmHg (04/11 0857) SpO2:  [95 %] 95 % (04/11 0821)  General Appearance: Alert, cooperative, no distress, appears stated age Head: Normocephalic, without obvious abnormality, atraumatic Eyes: PERRL, conjunctiva/corneas clear, EOM's intact, fundi benign, both eyes      Ears: Normal TM's and external ear canals, both ears Throat: Lips, mucosa, and tongue normal; teeth and gums normal Neck: Supple, symmetrical, trachea midline, no adenopathy; thyroid: No enlargement/tenderness/nodules; no carotid bruit or JVD Back: Symmetric, no curvature, ROM normal, no CVA tenderness Lungs: Clear to auscultation  bilaterally, respirations unlabored Heart: Regular rate and rhythm, S1 and S2 normal, no murmur, rub or gallop Abdomen: Soft, non-tender, bowel sounds active all four quadrants, no masses, no organomegaly Extremities: Extremities normal, atraumatic, no cyanosis or edema Pulses: 2+ and symmetric all extremities Skin: Skin color, texture, turgor normal, no rashes or lesions  NEUROLOGIC:   Mental status: alert and oriented, no aphasia, good attention span, Fund of knowledge/ memory ok Motor Exam - grossly normal Sensory Exam - grossly normal Reflexes:  Coordination - grossly normal Gait - grossly normal Balance - grossly normal Cranial Nerves:  I: smell Not tested  II: visual acuity  OS: Normal    OD: Normal   II: visual fields Full to confrontation  II: pupils Equal, round, reactive to light  III,VII: ptosis None  III,IV,VI: extraocular muscles  Full ROM  V: mastication Normal  V: facial light touch sensation  Normal  V,VII: corneal reflex  Present  VII: facial muscle function - upper  Normal  VII: facial muscle function - lower Normal  VIII: hearing Not tested  IX: soft palate elevation  Normal  IX,X: gag reflex Present  XI: trapezius strength  5/5  XI: sternocleidomastoid strength 5/5  XI: neck flexion strength  5/5  XII: tongue strength  Normal    Data Review Lab Results  Component Value Date   WBC 7.9 01/02/2012   HGB 10.9* 01/02/2012   HCT 32.9* 01/02/2012   MCV 81.8 01/02/2012   PLT 272 01/02/2012   Lab Results  Component Value Date   NA 137 01/02/2012   K 5.3* 01/02/2012   CL 99 01/02/2012   CO2 29 01/02/2012   BUN 19 01/02/2012   CREATININE 0.68 01/02/2012   GLUCOSE 96 01/02/2012   No results found for this basename: INR, PROTIME    Assessment/Plan: L3-4 spinal stenosis, neurogenic claudication, lumbago, lumbar radiculopathy: I discussed situation with the patient. I reviewed her MR scan with her and pointed out the abnormalities. We have discussed the various treatment  options including surgery. I described the surgical option of the L3-4 decompression instrumentation and fusion. I described the surgery to her. I've shown her surgical models. I discussed the risks, benefits, alternatives and likelihood of achieving our goals with surgery. Advanced all the patient's questions. She wants to proceed with the operation.   Loveda Colaizzi D 01/08/2012 10:35 AM

## 2012-01-08 NOTE — Preoperative (Signed)
Beta Blockers   Reason not to administer Beta Blockers:Not Applicable 

## 2012-01-08 NOTE — Progress Notes (Signed)
Pt placed on cpap for the night. Pt unaware of settings, so adjusted numerous times to 14 cmH2O. She said this feels about right. 2L O2 bled in, SpO2-100%.  Jacqulynn Cadet RRT

## 2012-01-08 NOTE — Progress Notes (Signed)
Morphine pca 6 mg cleared at 1800

## 2012-01-08 NOTE — Anesthesia Postprocedure Evaluation (Signed)
  Anesthesia Post-op Note  Patient: Amy Barnes  Procedure(s) Performed: Procedure(s) (LRB): POSTERIOR LUMBAR FUSION 1 LEVEL (Bilateral)  Patient Location: PACU  Anesthesia Type: General  Level of Consciousness: awake and alert   Airway and Oxygen Therapy: Patient Spontanous Breathing and Patient connected to nasal cannula oxygen  Post-op Pain: moderate  Post-op Assessment: Post-op Vital signs reviewed, Patient's Cardiovascular Status Stable, Respiratory Function Stable and Patent Airway  Post-op Vital Signs: Reviewed and stable  Complications: No apparent anesthesia complications

## 2012-01-08 NOTE — Anesthesia Procedure Notes (Signed)
Procedure Name: Intubation Date/Time: 01/08/2012 11:00 AM Performed by: Robyn Nohr, Terressa S Pre-anesthesia Checklist: Patient identified, Emergency Drugs available, Suction available, Patient being monitored and Timeout performed Patient Re-evaluated:Patient Re-evaluated prior to inductionOxygen Delivery Method: Circle system utilized Preoxygenation: Pre-oxygenation with 100% oxygen Intubation Type: IV induction Ventilation: Mask ventilation without difficulty Laryngoscope Size: Mac and 3 Grade View: Grade I Tube type: Oral Tube size: 7.5 mm Number of attempts: 1 Airway Equipment and Method: Stylet Placement Confirmation: ETT inserted through vocal cords under direct vision,  positive ETCO2 and breath sounds checked- equal and bilateral Secured at: 21 cm Tube secured with: Tape Dental Injury: Teeth and Oropharynx as per pre-operative assessment

## 2012-01-09 LAB — BASIC METABOLIC PANEL
BUN: 11 mg/dL (ref 6–23)
CO2: 26 mEq/L (ref 19–32)
Calcium: 9.1 mg/dL (ref 8.4–10.5)
Chloride: 95 mEq/L — ABNORMAL LOW (ref 96–112)
Creatinine, Ser: 0.53 mg/dL (ref 0.50–1.10)
GFR calc Af Amer: >90 mL/min (ref >90)
GFR calc non Af Amer: >90 mL/min (ref >90)
Glucose, Bld: 143 mg/dL — ABNORMAL HIGH (ref 70–99)
Potassium: 4.3 mEq/L (ref 3.5–5.1)
Sodium: 130 mEq/L — ABNORMAL LOW (ref 135–145)

## 2012-01-09 LAB — GLUCOSE, CAPILLARY
Glucose-Capillary: 90 mg/dL (ref 70–99)
Glucose-Capillary: 126 mg/dL — ABNORMAL HIGH (ref 70–99)
Glucose-Capillary: 151 mg/dL — ABNORMAL HIGH (ref 70–99)
Glucose-Capillary: 136 mg/dL — ABNORMAL HIGH (ref 70–99)
Glucose-Capillary: 128 mg/dL — ABNORMAL HIGH (ref 70–99)

## 2012-01-09 LAB — CBC
HCT: 30.4 % — ABNORMAL LOW (ref 36.0–46.0)
Hemoglobin: 10.1 g/dL — ABNORMAL LOW (ref 12.0–15.0)
MCH: 26.9 pg (ref 26.0–34.0)
MCHC: 33.2 g/dL (ref 30.0–36.0)
MCV: 81.1 fL (ref 78.0–100.0)
Platelets: 249 10*3/uL (ref 150–400)
RBC: 3.75 MIL/uL — ABNORMAL LOW (ref 3.87–5.11)
RDW: 15.3 % (ref 11.5–15.5)
WBC: 6.9 10*3/uL (ref 4.0–10.5)

## 2012-01-09 MED ORDER — MORPHINE SULFATE (PF) 1 MG/ML IV SOLN
INTRAVENOUS | Status: AC
  Administered 2012-01-09: 20:00:00
  Filled 2012-01-09: qty 25

## 2012-01-09 MED ORDER — MORPHINE SULFATE (PF) 1 MG/ML IV SOLN
INTRAVENOUS | Status: AC
  Administered 2012-01-09: 01:00:00
  Filled 2012-01-09: qty 25

## 2012-01-09 MED ORDER — MORPHINE SULFATE (PF) 1 MG/ML IV SOLN
INTRAVENOUS | Status: AC
  Filled 2012-01-09: qty 25

## 2012-01-09 MED ORDER — SODIUM CHLORIDE 0.9 % IJ SOLN
INTRAMUSCULAR | Status: AC
  Administered 2012-01-09: 10 mL
  Filled 2012-01-09: qty 20

## 2012-01-09 MED ORDER — SODIUM CHLORIDE 0.9 % IJ SOLN
INTRAMUSCULAR | Status: AC
  Filled 2012-01-09: qty 30

## 2012-01-09 MED ORDER — BIOTENE DRY MOUTH MT LIQD
15.0000 mL | Freq: Two times a day (BID) | OROMUCOSAL | Status: DC
  Administered 2012-01-09: 15 mL via OROMUCOSAL

## 2012-01-09 MED ORDER — MORPHINE SULFATE (PF) 1 MG/ML IV SOLN
INTRAVENOUS | Status: AC
  Administered 2012-01-09: 05:00:00
  Filled 2012-01-09: qty 25

## 2012-01-09 NOTE — Progress Notes (Signed)
UR COMPLETED  

## 2012-01-09 NOTE — Evaluation (Signed)
Physical Therapy Evaluation Patient Details Name: Amy Barnes MRN: [id number] DOB: 28-Oct-1950 Today's Date: 01/09/2012  Problem List:  Patient Active Problem List  Diagnoses  . HYPOTHYROIDISM  . DIABETES MELLITUS, TYPE II  . DIABETIC PERIPHERAL NEUROPATHY  . HYPERCHOLESTEROLEMIA  . ANEMIA-NOS  . BIPOLAR AFFECTIVE DISORDER, DEPRESSED  . BENIGN POSITIONAL VERTIGO  . HYPERTENSION  . ALLERGIC RHINITIS  . ASTHMA  . GERD  . OSTEOARTHRITIS  . HIP PAIN  . SLEEP APNEA  . URINARY INCONTINENCE, MIXED  . CERVICAL RADICULOPATHY, RIGHT  . Obesity, morbid (more than 100 lbs over ideal weight or BMI > 40)  . Leg pain, anterior    Past Medical History:  Past Medical History  Diagnosis Date  . Anemia   . Osteoarthritis   . Asthma   . Diabetes mellitus   . Hypothyroid   . GERD (gastroesophageal reflux disease)   . Allergic rhinitis   . Hypertension   . Bipolar disorder     takes Synthroid meds for Bipolar   Past Surgical History:  Past Surgical History  Procedure Date  . Total hip arthroplasty 2002    Left  . Partial hysterectomy 1982    for mennorhagia, no cervix  . Foot surgery 1982    bone spur  . Neck surgery     Herniated disk C2,3,4  . Tmj arthroplasty 1982  . Hospitalized     for bipolar  . Abdominal hysterectomy   . Joint replacement     2002  . Carpal tunnel release 2002    PT Assessment/Plan/Recommendation PT Assessment Clinical Impression Statement: Pt is 61 y/o female admitted for s/p L3-4 PLIF.  Pt limited due to pain and lethargy.  Pt will benefit from acute PT services to improve overall mobility to prepare for safe d/c home with family. PT Recommendation/Assessment: Patient will need skilled PT in the acute care venue PT Problem List: Decreased strength;Decreased activity tolerance;Decreased balance;Decreased knowledge of use of DME;Decreased mobility;Decreased knowledge of precautions;Pain PT Therapy Diagnosis : Difficulty walking;Abnormality of  gait;Generalized weakness;Acute pain PT Plan PT Frequency: Min 5X/week PT Treatment/Interventions: DME instruction;Gait training;Stair training;Functional mobility training;Therapeutic activities;Therapeutic exercise;Balance training;Neuromuscular re-education;Patient/family education PT Recommendation Follow Up Recommendations: Home health PT;Supervision/Assistance - 24 hour (Depending on pt progression) Equipment Recommended: Defer to next venue PT Goals  Acute Rehab PT Goals PT Goal Formulation: With patient/family Time For Goal Achievement: 7 days Pt will Roll Supine to Right Side: with modified independence PT Goal: Rolling Supine to Right Side - Progress: Goal set today Pt will go Supine/Side to Sit: with modified independence PT Goal: Supine/Side to Sit - Progress: Goal set today Pt will go Sit to Supine/Side: with modified independence PT Goal: Sit to Supine/Side - Progress: Goal set today Pt will go Sit to Stand: with modified independence PT Goal: Sit to Stand - Progress: Goal set today Pt will go Stand to Sit: with modified independence PT Goal: Stand to Sit - Progress: Goal set today Pt will Ambulate: >150 feet;with supervision;with rolling walker PT Goal: Ambulate - Progress: Goal set today Pt will Go Up / Down Stairs: 1-2 stairs;with least restrictive assistive device;with min assist PT Goal: Up/Down Stairs - Progress: Goal set today Additional Goals Additional Goal #1: Pt able to recall and adhere to 3/3 back precautions. PT Goal: Additional Goal #1 - Progress: Goal set today  PT Evaluation Precautions/Restrictions  Precautions Precautions: Back;Fall Precaution Comments: Educated pt on back precautions. Will need re-education.  Required Braces or Orthoses: Spinal Brace Spinal Brace: Lumbar  corset;Applied in sitting position Prior Functioning  Home Living Lives With: Family (daughter, granddaughter, brother and his son (sometimes)) Available Help at Discharge:  Family Type of Home: House Home Access: Stairs to enter Secretary/administrator of Steps: 2 Entrance Stairs-Rails: None Home Layout: Two level;Able to live on main level with bedroom/bathroom Alternate Level Stairs-Number of Steps: n/a Bathroom Shower/Tub: Tub/shower unit;Curtain Bathroom Toilet: Handicapped height Bathroom Accessibility: Yes How Accessible: Accessible via walker Home Adaptive Equipment: Bedside commode/3-in-1;Reacher;Grab bars in shower;Tub transfer bench;Walker - rolling;Sock aid Prior Function Level of Independence: Needs assistance Needs Assistance: Dressing;Bathing Dressing: Moderate Able to Take Stairs?: Reciprically Driving: Yes Cognition Cognition Arousal/Alertness: Lethargic Orientation Level: Oriented X4 Sensation/Coordination Sensation Light Touch: Appears Intact Coordination Gross Motor Movements are Fluid and Coordinated: Not tested Fine Motor Movements are Fluid and Coordinated: Not tested Extremity Assessment RUE Assessment RUE Assessment: Not tested (Grossly appears [facility]) LUE Assessment LUE Assessment: Not tested (grossly appears [facility]) RLE Assessment RLE Assessment: Not tested LLE Assessment LLE Assessment: Not tested Mobility (including Balance) Bed Mobility Bed Mobility: Yes Sit to Supine: 1: +2 Total assist;HOB flat;With rail (pt=60%. ) Sit to Supine - Details (indicate cue type and reason): Pt needed (A) to elevate LE into bed to prevent twisting and maintain proper positioning.   Transfers Transfers: Yes Sit to Stand: 1: +2 Total assist;Patient percentage (comment);From chair/3-in-1 (pt 70%) Sit to Stand Details (indicate cue type and reason): (A) to maintian balance with max cues for hand and LE placement Stand to Sit: 1: +2 Total assist;Patient percentage (comment);To chair/3-in-1 (pt 70%) Stand to Sit Details: (A) for to slowly descend to recliner with max cues for hand placement and proper body position prior to  transfer. Ambulation/Gait Ambulation/Gait: Yes Ambulation/Gait Assistance: 1: +2 Total assist;Patient percentage (comment) (pt 70) Ambulation/Gait Assistance Details (indicate cue type and reason): Pt needed max cues for RW placement with manual cues for proper RW placement.  Pt very slow to move due to pain and needs cues for proper LE placement. Ambulation Distance (Feet): 8 Feet Assistive device: Rolling walker Gait Pattern: Step-to pattern;Shuffle Gait velocity: decrease speed due to pain Stairs: No Wheelchair Mobility Wheelchair Mobility: No    Exercise    End of Session PT - End of Session Equipment Utilized During Treatment: Gait belt;Back brace Activity Tolerance: Patient limited by pain;Other (comment) (pt lethargic and slow to respond to commands) Patient left: in bed;with call bell in reach;with family/visitor present Nurse Communication: Mobility status for transfers;Mobility status for ambulation General Behavior During Session: Lethargic Cognition: Impaired Cognitive Impairment: Impaired question due to pain meds.  Very slow to respond  Tarnisha Kachmar 01/09/2012, 2:20 PM [place], PT DPT [phone]

## 2012-01-09 NOTE — Evaluation (Signed)
Occupational Therapy Evaluation Patient Details Name: Amy Barnes MRN: [id number] DOB: Aug 11, 1951 Today's Date: 01/09/2012  Problem List:  Patient Active Problem List  Diagnoses  . HYPOTHYROIDISM  . DIABETES MELLITUS, TYPE II  . DIABETIC PERIPHERAL NEUROPATHY  . HYPERCHOLESTEROLEMIA  . ANEMIA-NOS  . BIPOLAR AFFECTIVE DISORDER, DEPRESSED  . BENIGN POSITIONAL VERTIGO  . HYPERTENSION  . ALLERGIC RHINITIS  . ASTHMA  . GERD  . OSTEOARTHRITIS  . HIP PAIN  . SLEEP APNEA  . URINARY INCONTINENCE, MIXED  . CERVICAL RADICULOPATHY, RIGHT  . Obesity, morbid (more than 100 lbs over ideal weight or BMI > 40)  . Leg pain, anterior    Past Medical History:  Past Medical History  Diagnosis Date  . Anemia   . Osteoarthritis   . Asthma   . Diabetes mellitus   . Hypothyroid   . GERD (gastroesophageal reflux disease)   . Allergic rhinitis   . Hypertension   . Bipolar disorder     takes Synthroid meds for Bipolar   Past Surgical History:  Past Surgical History  Procedure Date  . Total hip arthroplasty 2002    Left  . Partial hysterectomy 1982    for mennorhagia, no cervix  . Foot surgery 1982    bone spur  . Neck surgery     Herniated disk C2,3,4  . Tmj arthroplasty 1982  . Hospitalized     for bipolar  . Abdominal hysterectomy   . Joint replacement     2002  . Carpal tunnel release 2002    OT Assessment/Plan/Recommendation OT Assessment Clinical Impression Statement: Pt admitted for L3-4 decompression and fusion. Pt will benefit from skilled OT in the acute setting to maximize I with ADL and ADL mobility to Mod I-Min A prior to d/c OT Recommendation/Assessment: Patient will need skilled OT in the acute care venue OT Problem List: Decreased activity tolerance;Impaired balance (sitting and/or standing);Decreased knowledge of use of DME or AE;Decreased knowledge of precautions;Pain;Obesity;Cardiopulmonary status limiting activity OT Therapy Diagnosis : Generalized  weakness;Acute pain OT Plan OT Frequency: Min 1X/week OT Treatment/Interventions: Self-care/ADL training;DME and/or AE instruction;Therapeutic activities;Patient/family education;Balance training OT Recommendation Follow Up Recommendations: Skilled nursing facility (vs HHOT with int. supervision pending pt progress) Equipment Recommended: Defer to next venue Individuals Consulted Consulted and Agree with Results and Recommendations: Patient;Family member/caregiver Family Member Consulted: father? OT Goals Acute Rehab OT Goals OT Goal Formulation: With patient Time For Goal Achievement: 2 weeks ADL Goals Pt Will Perform Grooming: with modified independence;Standing at sink ADL Goal: Grooming - Progress: Goal set today Pt Will Perform Upper Body Bathing: with supervision;with set-up;Sitting, chair;Sitting, edge of bed;Sitting at sink ADL Goal: Upper Body Bathing - Progress: Goal set today Pt Will Perform Lower Body Bathing: with min assist;Standing at sink;Sit to stand from chair;Sit to stand from bed ADL Goal: Lower Body Bathing - Progress: Goal set today Pt Will Perform Upper Body Dressing: with supervision;with set-up;Sitting, bed ADL Goal: Upper Body Dressing - Progress: Goal set today Pt Will Perform Lower Body Dressing: with min assist;with adaptive equipment;Sit to stand from chair;Sit to stand from bed ADL Goal: Lower Body Dressing - Progress: Goal set today Pt Will Transfer to Toilet: with modified independence;Ambulation;with DME;Extra wide 3-in-1 ADL Goal: Toilet Transfer - Progress: Goal set today Pt Will Perform Toileting - Clothing Manipulation: with supervision;Standing ADL Goal: Toileting - Clothing Manipulation - Progress: Goal set today Pt Will Perform Toileting - Hygiene: with min assist;with adaptive equipment;Standing at 3-in-1/toilet ADL Goal: Toileting - Hygiene - Progress:  Goal set today Pt Will Perform Tub/Shower Transfer: Tub transfer;with min assist;Transfer  tub bench;Ambulation;with DME ADL Goal: Tub/Shower Transfer - Progress: Goal set today Additional ADL Goal #1: Pt will I'ly verbalize/generalize 3/3 back precautions as precursor to ADL's ADL Goal: Additional Goal #1 - Progress: Goal set today Additional ADL Goal #2: Pt will verbalize/demonstrate brace don/doffing and care as precursor for ADLs ADL Goal: Additional Goal #2 - Progress: Goal set today  OT Evaluation Precautions/Restrictions  Precautions Precautions: Back;Fall Precaution Comments: Educated pt on back precautions. Will need re-education.  Required Braces or Orthoses: Spinal Brace Spinal Brace: Lumbar corset;Applied in sitting position Prior Functioning Home Living Lives With: Family (daughter, granddaughter, brother and his son (sometimes)) Available Help at Discharge: Family Type of Home: House Home Access: Stairs to enter Secretary/administrator of Steps: 2 Entrance Stairs-Rails: None Home Layout: Two level;Able to live on main level with bedroom/bathroom Alternate Level Stairs-Number of Steps: n/a Bathroom Shower/Tub: Tub/shower unit;Curtain Bathroom Toilet: Handicapped height Bathroom Accessibility: Yes How Accessible: Accessible via walker Home Adaptive Equipment: Bedside commode/3-in-1;Reacher;Grab bars in shower;Tub transfer bench;Walker - rolling;Sock aid Prior Function Level of Independence: Needs assistance Needs Assistance: Dressing;Bathing Dressing: Moderate Able to Take Stairs?: Reciprically Driving: Yes  ADL ADL Eating/Feeding: Not assessed Grooming: Set up;Performed;Wash/dry face Where Assessed - Grooming: Sitting, chair Upper Body Bathing: Simulated;Moderate assistance Where Assessed - Upper Body Bathing: Sitting, chair Lower Body Bathing: Not assessed Upper Body Dressing: Not assessed Lower Body Dressing: Not assessed Toilet Transfer: Simulated;+2 Total assistance (pt=70%) Toilet Transfer Details (indicate cue type and reason): sit to stand  from chair with armrests and ambulation from chair to EOB Toileting - Clothing Manipulation: Simulated;+1 Total assistance Where Assessed - Toileting Clothing Manipulation: Standing Toileting - Hygiene: Simulated;+1 Total assistance Where Assessed - Toileting Hygiene: Standing Tub/Shower Transfer: Not assessed Equipment Used: Rolling walker Ambulation Related to ADLs: +2total A(pt=70%) ~20ft from chair to EOB ADL Comments: Pt lethargic on pain medications. Pt states she has reacher, but would like all other AE. Attempted to educate pt where to purchase, but pt too lethargic to verbalize understanding. Vision/Perception    Cognition Cognition Arousal/Alertness: Lethargic Orientation Level: Oriented X4 Sensation/Coordination Sensation Light Touch: Appears Intact Coordination Gross Motor Movements are Fluid and Coordinated: Not tested Fine Motor Movements are Fluid and Coordinated: Not tested Extremity Assessment RUE Assessment RUE Assessment: Not tested (Grossly appears [facility]) LUE Assessment LUE Assessment: Not tested (grossly appears [facility]) Mobility  Bed Mobility Bed Mobility: Yes Sit to Supine: 1: +2 Total assist;HOB flat;With rail (pt=60%. ) Sit to Supine - Details (indicate cue type and reason): Pt needed (A) to elevate LE into bed to prevent twisting and maintain proper positioning.   Transfers Sit to Stand: 1: +2 Total assist;Patient percentage (comment);From chair/3-in-1 (pt 70%) Sit to Stand Details (indicate cue type and reason): (A) to maintian balance with max cues for hand and LE placement Stand to Sit: 1: +2 Total assist;Patient percentage (comment);To chair/3-in-1 (pt 70%) Stand to Sit Details: (A) for to slowly descend to recliner with max cues for hand placement and proper body position prior to transfer. Exercises   End of Session OT - End of Session Equipment Utilized During Treatment: Back brace Activity Tolerance:  (limited by lethargy from medication) Patient  left: in bed;with call bell in reach;with family/visitor present Nurse Communication: Mobility status for transfers;Mobility status for ambulation General Behavior During Session: Lethargic Cognition: Impaired Cognitive Impairment: Impaired question due to pain meds.  Very slow to respond   Reina Fuse, OTR/L populated  note for [place] Chard due to noted information was in doc flow sheets, but did not populate. [phone] 01/09/2012, 3:47 PM

## 2012-01-09 NOTE — Progress Notes (Signed)
Patient ID: Amy Barnes, female   DOB: 26-Mar-1951, 61 y.o.   MRN: [id number] Subjective:  The patient is alert and pleasant. She complains of back pain.  Objective: Vital signs in last 24 hours: Temp:  [97.1 F (36.2 C)-101.7 F (38.7 C)] 101.7 F (38.7 C) (04/12 0000) Pulse Rate:  [77-113] 113  (04/11 2234) Resp:  [15-38] 16  (04/12 0457) BP: (148-193)/(63-130) 159/93 mmHg (04/12 0000) SpO2:  [95 %-100 %] 95 % (04/12 0457)  Intake/Output from previous day: 04/11 0701 - 04/12 0700 In: 2500 [I.V.:2500] Out: 1365 [Urine:1040; Blood:325] Intake/Output this shift:    Physical exam the patient is alert and oriented. She is moving all 4 extremities well. Her dressing is clean and dry.  Lab Results:  Basename 01/09/12 0500  WBC 6.9  HGB 10.1*  HCT 30.4*  PLT 249   BMET  Basename 01/09/12 0500  NA 130*  K 4.3  CL 95*  CO2 26  GLUCOSE 143*  BUN 11  CREATININE 0.53  CALCIUM 9.1    Studies/Results: Dg Lumbar Spine 2-3 Views  01/08/2012  *RADIOLOGY REPORT*  Clinical Data: L3-L4 PLIF.  LUMBAR SPINE - 2-3 VIEW  Comparison: None.  Findings: Three intraoperative fluoroscopic spot films demonstrate posterior rod and pedicle screw fixation at L3-L4.  Soft tissue retractors are present posterior to L3-L4.  IMPRESSION: L3-L4 PLIF.  Original Report Authenticated By: Andreas Newport, M.D.   Dg Lumbar Spine 1 View  01/08/2012  *RADIOLOGY REPORT*  Clinical Data: L3-4 posterior fusion.  LUMBAR SPINE - 1 VIEW  Comparison: Outside studies 12/09/2011  Findings: Study is limited by body habitus.  It is difficult to visualize the lower lumbar spine.  Post surgical instruments are directed at the lumbar level with slight anterolisthesis and vacuum disc.  When compared to the prior study, this appears to be L3-4.  IMPRESSION: Limited study by body habitus.  There appears to be localization of L3-4 as described above.  Original Report Authenticated By: Cyndie Chime, M.D.   Dg Chest Port 1  View  01/08/2012  *RADIOLOGY REPORT*  Clinical Data: Postop line placement.  PORTABLE CHEST - 1 VIEW  Comparison: 01/02/2012.  Findings:  Right internal jugular catheter has been placed with the tip at the level of the proximal superior vena cava.  No gross pneumothorax.  Oblique band-like atelectasis right mid to lower lobe. Subsegmental atelectatic changes left mid to lower lobe.  Central pulmonary vascular prominence.  Heart slightly enlarged.  Slightly tortuous aorta.  Postsurgical changes lower cervical spine.  Impression:  Right internal jugular catheter has been placed with the tip at the level of the proximal superior vena cava.  No gross pneumothorax.  Oblique band-like atelectasis right mid to lower lobe. Subsegmental atelectatic changes left mid to lower lobe.  Original Report Authenticated By: Fuller Canada, M.D.   Dg C-arm 1-60 Min  01/08/2012  *RADIOLOGY REPORT*  Clinical Data: L3-L4 posterior lumbar interbody fusion.  C-ARM 1-60 MIN  Comparison: 12/09/2011.  Findings: Retractors are present over the L3-L4 level.  Three intraoperative fluoroscopic spot views are submitted for interpretation.  Two of these radiographs are in the lateral projection.  IMPRESSION: Intraoperative fluoroscopy demonstrating L3-L4 PLIF.  Original Report Authenticated By: Andreas Newport, M.D.    Assessment/Plan: Postop day #1: We will mobilize the patient with PT and OT. I will transfer her to 3000.  Hyponatremia: I will repeat her sodium on Sunday.  Low-grade fever: At this point is likely secondary to atelectasis. I will culture  her as needed if she continues to have fever spikes.  LOS: 1 day     Jacqulyn Barresi D 01/09/2012, 7:46 AM

## 2012-01-10 LAB — GLUCOSE, CAPILLARY
Glucose-Capillary: 100 mg/dL — ABNORMAL HIGH (ref 70–99)
Glucose-Capillary: 104 mg/dL — ABNORMAL HIGH (ref 70–99)
Glucose-Capillary: 122 mg/dL — ABNORMAL HIGH (ref 70–99)
Glucose-Capillary: 125 mg/dL — ABNORMAL HIGH (ref 70–99)
Glucose-Capillary: 131 mg/dL — ABNORMAL HIGH (ref 70–99)
Glucose-Capillary: 133 mg/dL — ABNORMAL HIGH (ref 70–99)

## 2012-01-10 MED ORDER — MORPHINE SULFATE 2 MG/ML IJ SOLN: 2.0000 mg | INTRAMUSCULAR | Status: DC | PRN

## 2012-01-10 MED ORDER — WHITE PETROLATUM GEL
Status: AC
  Administered 2012-01-10: 09:00:00
  Filled 2012-01-10: qty 5

## 2012-01-10 MED ORDER — MORPHINE SULFATE (PF) 1 MG/ML IV SOLN
INTRAVENOUS | Status: AC
  Administered 2012-01-10: 05:00:00
  Filled 2012-01-10: qty 25

## 2012-01-10 MED ORDER — MORPHINE SULFATE CR 30 MG PO TB12
30.0000 mg | ORAL_TABLET | Freq: Three times a day (TID) | ORAL | Status: DC
  Administered 2012-01-10 – 2012-01-12 (×7): 30 mg via ORAL
  Filled 2012-01-10 (×7): qty 1

## 2012-01-10 NOTE — Progress Notes (Signed)
Patient not on cpap tonight due to PCA pump with end tidal CO2 monitoring in place.

## 2012-01-10 NOTE — Progress Notes (Signed)
Patient ID: Amy Barnes, female   DOB: 12-20-1950, 61 y.o.   MRN: [id number] Subjective:  The patient is mildly somnolent but easily arousable. She is pleasant. She looks better today she continues to complain of back pain.  Objective: Vital signs in last 24 hours: Temp:  [99.1 F (37.3 C)-100.2 F (37.9 C)] 100.2 F (37.9 C) (04/13 0600) Pulse Rate:  [103-116] 103  (04/13 0600) Resp:  [15-24] 20  (04/13 0600) BP: (95-159)/(58-87) 115/67 mmHg (04/13 0600) SpO2:  [91 %-96 %] 92 % (04/13 0600) Weight:  [161.4 kg (355 lb 13.2 oz)] 161.4 kg (355 lb 13.2 oz) (04/13 0300)  Intake/Output from previous day: 04/12 0701 - 04/13 0700 In: 75 [I.V.:75] Out: 1500 [Urine:1500] Intake/Output this shift:    Physical exam the patient is mildly somnolent but easily arousable and oriented. She is moving all 4 extremities well.  Lab Results:  Basename 01/09/12 0500  WBC 6.9  HGB 10.1*  HCT 30.4*  PLT 249   BMET  Basename 01/09/12 0500  NA 130*  K 4.3  CL 95*  CO2 26  GLUCOSE 143*  BUN 11  CREATININE 0.53  CALCIUM 9.1    Studies/Results: Dg Lumbar Spine 2-3 Views  01/08/2012  *RADIOLOGY REPORT*  Clinical Data: L3-L4 PLIF.  LUMBAR SPINE - 2-3 VIEW  Comparison: None.  Findings: Three intraoperative fluoroscopic spot films demonstrate posterior rod and pedicle screw fixation at L3-L4.  Soft tissue retractors are present posterior to L3-L4.  IMPRESSION: L3-L4 PLIF.  Original Report Authenticated By: Andreas Newport, M.D.   Dg Lumbar Spine 1 View  01/08/2012  *RADIOLOGY REPORT*  Clinical Data: L3-4 posterior fusion.  LUMBAR SPINE - 1 VIEW  Comparison: Outside studies 12/09/2011  Findings: Study is limited by body habitus.  It is difficult to visualize the lower lumbar spine.  Post surgical instruments are directed at the lumbar level with slight anterolisthesis and vacuum disc.  When compared to the prior study, this appears to be L3-4.  IMPRESSION: Limited study by body habitus.  There appears  to be localization of L3-4 as described above.  Original Report Authenticated By: Cyndie Chime, M.D.   Dg Chest Port 1 View  01/08/2012  *RADIOLOGY REPORT*  Clinical Data: Postop line placement.  PORTABLE CHEST - 1 VIEW  Comparison: 01/02/2012.  Findings:  Right internal jugular catheter has been placed with the tip at the level of the proximal superior vena cava.  No gross pneumothorax.  Oblique band-like atelectasis right mid to lower lobe. Subsegmental atelectatic changes left mid to lower lobe.  Central pulmonary vascular prominence.  Heart slightly enlarged.  Slightly tortuous aorta.  Postsurgical changes lower cervical spine.  Impression:  Right internal jugular catheter has been placed with the tip at the level of the proximal superior vena cava.  No gross pneumothorax.  Oblique band-like atelectasis right mid to lower lobe. Subsegmental atelectatic changes left mid to lower lobe.  Original Report Authenticated By: Fuller Canada, M.D.   Dg C-arm 1-60 Min  01/08/2012  *RADIOLOGY REPORT*  Clinical Data: L3-L4 posterior lumbar interbody fusion.  C-ARM 1-60 MIN  Comparison: 12/09/2011.  Findings: Retractors are present over the L3-L4 level.  Three intraoperative fluoroscopic spot views are submitted for interpretation.  Two of these radiographs are in the lateral projection.  IMPRESSION: Intraoperative fluoroscopy demonstrating L3-L4 PLIF.  Original Report Authenticated By: Andreas Newport, M.D.    Assessment/Plan: Postop day #2: We will discontinue her PCA pump and Hep-Lock her IV. I will increase her  MS Contin to attempt to get better pain control.  Hyponatremia: I'll recheck her BMP tomorrow.  LOS: 2 days     Shanel Prazak D 01/10/2012, 9:50 AM

## 2012-01-10 NOTE — Progress Notes (Signed)
PT Cancellation Note  Treatment cancelled today due to RN requested therapy come back later as pt just returned to bed after sitting up for 1 hour and they are waiting on IV team to come and discontinue pt's IV. Informed RN and pt that PT will attempt to return later today as able vs tomorrow. RN verbalized understanding, pt with eyes closed with family at bedside.  Sallyanne Kuster 01/10/2012, 10:49 AM  Sallyanne Kuster, PTA Office- [phone] Pager- [phone]

## 2012-01-11 LAB — GLUCOSE, CAPILLARY
Glucose-Capillary: 105 mg/dL — ABNORMAL HIGH (ref 70–99)
Glucose-Capillary: 95 mg/dL (ref 70–99)
Glucose-Capillary: 91 mg/dL (ref 70–99)
Glucose-Capillary: 94 mg/dL (ref 70–99)

## 2012-01-11 MED ORDER — CIPROFLOXACIN HCL 750 MG PO TABS
750.0000 mg | ORAL_TABLET | Freq: Two times a day (BID) | ORAL | Status: DC
  Administered 2012-01-11 – 2012-01-12 (×3): 750 mg via ORAL
  Filled 2012-01-11 (×5): qty 1

## 2012-01-11 NOTE — Progress Notes (Signed)
Physical Therapy Treatment Patient Details Name: Amy Barnes MRN: [id number] DOB: 1951-02-29 Today's Date: 01/11/2012  PT Assessment/Plan  PT - Assessment/Plan Comments on Treatment Session: Pt progressing well, she was able to tolerate increased mobility distance today. Pt still requires increased assistance for bed mobility. Attempt stairs tomorrow with RW PT Plan: Discharge plan remains appropriate;Frequency remains appropriate PT Frequency: Min 5X/week Follow Up Recommendations: Home health PT;Supervision/Assistance - 24 hour Equipment Recommended: Defer to next venue PT Goals  Acute Rehab PT Goals PT Goal Formulation: With patient/family PT Goal: Rolling Supine to Right Side - Progress: Progressing toward goal PT Goal: Supine/Side to Sit - Progress: Progressing toward goal PT Goal: Sit to Supine/Side - Progress: Progressing toward goal PT Goal: Sit to Stand - Progress: Progressing toward goal PT Goal: Stand to Sit - Progress: Progressing toward goal PT Goal: Ambulate - Progress: Progressing toward goal Additional Goals PT Goal: Additional Goal #1 - Progress: Progressing toward goal  PT Treatment Precautions/Restrictions  Precautions Precautions: Back;Fall Precaution Booklet Issued: Yes (comment) Precaution Comments: Pt able to recall 1/3 back precautions. Pt re-educated on precautions Required Braces or Orthoses: Spinal Brace Spinal Brace: Lumbar corset;Applied in sitting position Restrictions Weight Bearing Restrictions: No Mobility (including Balance) Bed Mobility Bed Mobility: Yes Sit to Supine: 3: Mod assist;HOB elevated (comment degrees);With rail Sit to Supine - Details (indicate cue type and reason): VC for sequencing to maintain back precautions. Assist with bilateral LEs as well as trunk control into lying Transfers Transfers: Yes Sit to Stand: With upper extremity assist;3: Mod assist;From chair/3-in-1 Sit to Stand Details (indicate cue type and reason): Mod  assist for stability into standing. VC for hand placement. Stand to Sit: 4: Min assist;With upper extremity assist;To bed Stand to Sit Details: VC for hand placement. Pt able to control descent Ambulation/Gait Ambulation/Gait: Yes Ambulation/Gait Assistance: Other (comment) (Minguard assist) Ambulation/Gait Assistance Details (indicate cue type and reason): VC for proper sequencing as well as distance to RW. Postural cues throughout Ambulation Distance (Feet): 150 Feet Assistive device: Rolling walker Gait Pattern: Step-to pattern;Trunk flexed;Decreased hip/knee flexion - right;Decreased hip/knee flexion - left Gait velocity: decreased velocity Stairs: No    Exercise    End of Session PT - End of Session Equipment Utilized During Treatment: Gait belt;Back brace Activity Tolerance: Patient tolerated treatment well Patient left: in bed;with call bell in reach;with family/visitor present Nurse Communication: Mobility status for transfers;Mobility status for ambulation General Behavior During Session: [facility] for tasks performed Cognition: [facility] for tasks performed  Milana Kidney 01/11/2012, 5:22 PM  01/11/2012 Milana Kidney DPT PAGER: [phone] OFFICE: [phone]

## 2012-01-11 NOTE — Progress Notes (Signed)
Patient ID: Amy Barnes, female   DOB: [birth year], 61 y.o.   MRN: [id number] Subjective:  The patient is alert and pleasant. She looks and feels better. 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  Objective: Vital signs in last 24 hours: Temp:  [98.3 F (36.8 C)-98.6 F (37 C)] 98.6 F (37 C) (04/14 0200) Pulse Rate:  [96-102] 96  (04/14 0200) Resp:  [20] 20  (04/14 0200) BP: (128-137)/(70-76) 130/70 mmHg (04/14 0200) SpO2:  [90 %-95 %] 95 % (04/14 0200)  Intake/Output from previous day: 04/13 0701 - 04/14 0700 In: 960 [P.O.:960] Out: 2050 [Urine:2050] Intake/Output this shift:    Physical exam the patient is alert and oriented. Her lower extremity strength is grossly normal.  Lab Results:  Basename 01/09/12 0500  WBC 6.9  HGB 10.1*  HCT 30.4*  PLT 249   BMET  Basename 01/09/12 0500  NA 130*  K 4.3  CL 95*  CO2 26  GLUCOSE 143*  BUN 11  CREATININE 0.53  CALCIUM 9.1    Studies/Results: No results found.  Assessment/Plan: Postop day #3: The patient is making progress and we will plan to send her home tomorrow. I will resume her Cipro.  LOS: 3 days     Celia Gibbons D 01/11/2012, 10:01 AM

## 2012-01-11 NOTE — Progress Notes (Signed)
Placed patient on CPAP at 12cm. Sp02=96% Will continue to monitor patient

## 2012-01-12 LAB — GLUCOSE, CAPILLARY
Glucose-Capillary: 112 mg/dL — ABNORMAL HIGH (ref 70–99)
Glucose-Capillary: 107 mg/dL — ABNORMAL HIGH (ref 70–99)
Glucose-Capillary: 85 mg/dL (ref 70–99)

## 2012-01-12 MED ORDER — MORPHINE SULFATE CR 30 MG PO TB12: 30.0000 mg | ORAL_TABLET | Freq: Three times a day (TID) | ORAL | Status: AC

## 2012-01-12 MED ORDER — OXYCODONE HCL 5 MG PO TABS: 15.0000 mg | ORAL_TABLET | ORAL | Status: AC | PRN

## 2012-01-12 MED ORDER — FLUTICASONE-SALMETEROL 115-21 MCG/ACT IN AERO: 2.0000 | INHALATION_SPRAY | Freq: Every day | RESPIRATORY_TRACT | Status: DC

## 2012-01-12 MED FILL — Heparin Sodium (Porcine) Inj 1000 Unit/ML: INTRAMUSCULAR | Qty: 30 | Status: AC

## 2012-01-12 MED FILL — Insulin Aspart Inj 100 Unit/ML: SUBCUTANEOUS | Qty: 0.03 | Status: AC

## 2012-01-12 MED FILL — Sodium Chloride IV Soln 0.9%: INTRAVENOUS | Qty: 1000 | Status: AC

## 2012-01-12 MED FILL — Insulin Aspart Inj 100 Unit/ML: SUBCUTANEOUS | Qty: 0.04 | Status: AC

## 2012-01-12 NOTE — Progress Notes (Signed)
CARE MANAGEMENT NOTE 01/12/2012  Patient:  Amy Barnes, Amy Barnes   Account Number:  [id number]  Date Initiated:  01/12/2012  Documentation initiated by:  Vance Peper  Subjective/Objective Assessment:   61 yr old female s/p L3-4 disclocation lumbar fusion     Action/Plan:   Spoke with patient and husband regarding home health needs. Choice offered.   Anticipated DC Date:  01/12/2012   Anticipated DC Plan:  HOME W HOME HEALTH SERVICES      DC Planning Services  CM consult      PAC Choice  DURABLE MEDICAL EQUIPMENT  HOME HEALTH   Choice offered to / List presented to:  C-1 Patient   DME arranged  HOSPITAL BED      DME agency  [facility]     HH arranged  HH-2 PT      [facility] agency  [facility]   Status of service:  Completed, signed off  Discharge Disposition:  HOME W HOME HEALTH SERVICES

## 2012-01-12 NOTE — Evaluation (Signed)
Amy Barnes, OTR/L Pager: [phone] 01/12/2012   

## 2012-01-12 NOTE — Discharge Summary (Signed)
Physician Discharge Summary  Patient ID: Amy Barnes MRN: [id number] DOB/AGE: 60/14/1952 60 y.o.  Admit date: 01/08/2012 Discharge date: 01/12/2012  Admission Diagnoses: L3-4 spondylolisthesis, spinal stenosis, lumbago, lumbar radiculopathy.  Discharge Diagnoses: The same Active Problems:  * No active hospital problems. *    Discharged Condition: good  Hospital Course: I admitted the patient to [facility] on 01/08/2012. On that day I performed a L3-4 decompression dislocation effusion. The surgery were well. The patient's postoperative course was unremarkable. On 01/12/2012 she was requesting discharge home and was discharged. The patient  was given oral and written discharge instructions all her questions were answered.  Consults: None Significant Diagnostic Studies: None Treatments: L3-4 decompression, each patient, and fusion. Discharge Exam: Blood pressure 114/68, pulse 83, temperature 97.2 F (36.2 C), temperature source Oral, resp. rate 18, height 5\' 4"  (1.626 m), weight 161.4 kg (355 lb 13.2 oz), SpO2 94.00%. The patient is alert and oriented. Her motor strength is normal. Her wound is healing well without signs of infection.  Disposition: Home  Discharge Orders    Future Appointments: Provider: Department: Dept Phone: Center:   02/11/2012 9:20 AM [facility] [place] [phone] [facility]   02/17/2012 3:15 PM Amy Michelle Nasuti, MD [facility] [phone] [facility]     Future Orders Please Complete By Expires   Diet - low sodium heart healthy      Increase activity slowly      Discharge instructions      Comments:   [phone] for a followup appointment.   No dressing needed      Call MD for:  temperature >100.4      Call MD for:  persistant nausea and vomiting      Call MD for:  severe uncontrolled pain      Call MD for:  redness, tenderness, or signs of infection (pain, swelling, redness, odor or green/yellow discharge around incision site)       Call MD for:  difficulty breathing, headache or visual disturbances      Call MD for:  hives      Call MD for:  persistant dizziness or light-headedness      Call MD for:  extreme fatigue        Medication List  As of 01/12/2012  2:15 PM   STOP taking these medications         celecoxib 200 MG capsule      NUCYNTA 75 MG Tabs      oxyCODONE-acetaminophen 10-325 MG per tablet         TAKE these medications         albuterol 90 MCG/ACT inhaler   Commonly known as: PROVENTIL,VENTOLIN   Inhale 2 puffs into the lungs every 6 (six) hours as needed.      aspirin 81 MG EC tablet   Take 81 mg by mouth daily.      CALCIUM + D PO   Take 1 tablet by mouth daily.      CENTRUM SILVER PO   Take 1 tablet by mouth daily.      ciprofloxacin 750 MG tablet   Commonly known as: CIPRO   Take 750 mg by mouth 2 (two) times daily.      clonazePAM 0.5 MG tablet   Commonly known as: KLONOPIN   Take 0.5 mg by mouth at bedtime as needed. For sleep      cyclobenzaprine 10 MG tablet   Commonly known as: FLEXERIL   Take 10 mg by  mouth 3 (three) times daily as needed. For muscle pain      docusate sodium 100 MG capsule   Commonly known as: COLACE   Take 300 mg by mouth at bedtime.      fluticasone-salmeterol 115-21 MCG/ACT inhaler   Commonly known as: ADVAIR HFA   Inhale 2 puffs into the lungs daily.      furosemide 80 MG tablet   Commonly known as: LASIX   Take 0.5 tablets (40 mg total) by mouth 2 (two) times daily as needed.      gabapentin 300 MG capsule   Commonly known as: NEURONTIN   Take 300 mg by mouth 3 (three) times daily.      glucose blood test strip   Use as instructed      IRON PO   Take 75 mg by mouth daily.      lamoTRIgine 200 MG tablet   Commonly known as: LAMICTAL   Take 200 mg by mouth daily.      levothyroxine 150 MCG tablet   Commonly known as: SYNTHROID, LEVOTHROID   Take 150 mcg by mouth daily.      losartan 50 MG tablet   Commonly known as: COZAAR     Take 50 mg by mouth daily.      metFORMIN 500 MG 24 hr tablet   Commonly known as: GLUCOPHAGE-XR   Take 1,000 mg by mouth daily with breakfast.      montelukast 10 MG tablet   Commonly known as: SINGULAIR   Take 1 tablet (10 mg total) by mouth at bedtime.      morphine 30 MG 12 hr tablet   Commonly known as: MS CONTIN   Take 1 tablet (30 mg total) by mouth every 8 (eight) hours.      Oxcarbazepine 300 MG tablet   Commonly known as: TRILEPTAL   Take 300-600 mg by mouth 2 (two) times daily. 1 in the morning and 2 every evening      oxyCODONE 5 MG immediate release tablet   Commonly known as: Oxy IR/ROXICODONE   Take 3 tablets (15 mg total) by mouth every 4 (four) hours as needed.      promethazine 25 MG tablet   Commonly known as: PHENERGAN   Take 25 mg by mouth every 6 (six) hours as needed.      simvastatin 40 MG tablet   Commonly known as: ZOCOR   Take 40 mg by mouth every evening.      VICTOZA 18 MG/3ML Soln   Generic drug: Liraglutide   Inject 1.2 mg into the skin daily.             SignedCristi Loron 01/12/2012, 2:15 PM

## 2012-01-12 NOTE — Discharge Instructions (Signed)
Home Health to be provided by [facility] [phone] 

## 2012-01-12 NOTE — Progress Notes (Signed)
Physical Therapy Treatment Patient Details Name: Amy Barnes MRN: [id number] DOB: 1951-08-23 Today's Date: 01/12/2012  PT Assessment/Plan  PT - Assessment/Plan Comments on Treatment Session: Pt is at a modified independence level for all mobility. Pt and family educated on safety upon d/c home PT Plan: Discharge plan remains appropriate;Frequency remains appropriate PT Frequency: Min 5X/week Follow Up Recommendations: Home health PT;Supervision/Assistance - 24 hour Equipment Recommended: None recommended by PT PT Goals  Acute Rehab PT Goals PT Goal Formulation: With patient/family PT Goal: Rolling Supine to Right Side - Progress: Met PT Goal: Supine/Side to Sit - Progress: Met PT Goal: Sit to Supine/Side - Progress: Progressing toward goal PT Goal: Sit to Stand - Progress: Met PT Goal: Stand to Sit - Progress: Met PT Goal: Ambulate - Progress: Met PT Goal: Up/Down Stairs - Progress: Met Additional Goals PT Goal: Additional Goal #1 - Progress: Progressing toward goal (requires cueing throughout to avoid twisting)  PT Treatment Precautions/Restrictions  Precautions Precautions: Back;Fall Precaution Booklet Issued: Yes (comment) Precaution Comments: Pt able to recall 3/3 back precautions.  Required Braces or Orthoses: Spinal Brace Spinal Brace: Lumbar corset;Applied in sitting position Restrictions Weight Bearing Restrictions: No Mobility (including Balance) Bed Mobility Bed Mobility: Yes Rolling Left: 6: Modified independent (Device/Increase time) Left Sidelying to Sit: 6: Modified independent (Device/Increase time) Sit to Supine: 4: Min assist;With rail;HOB elevated (comment degrees) Sit to Supine - Details (indicate cue type and reason): Min assist with LEs. Pt able to roll independently Transfers Transfers: Yes Sit to Stand: 6: Modified independent (Device/Increase time) Stand to Sit: 6: Modified independent (Device/Increase time) Stand Pivot Transfers: 6: Modified  independent (Device/Increase time) Ambulation/Gait Ambulation/Gait: Yes Ambulation/Gait Assistance: 6: Modified independent (Device/Increase time) Ambulation Distance (Feet): 150 Feet Assistive device: Rolling walker Gait Pattern: Step-to pattern;Trunk flexed;Decreased hip/knee flexion - right;Decreased hip/knee flexion - left Gait velocity: decreased velocity Stairs: Yes Stairs Assistance: 5: Supervision Stairs Assistance Details (indicate cue type and reason): Supervision for safety. Pt and sister educated on proper sequencing and technique with RW. Sister assisted with maintaining stability on RW. Stair Management Technique: Backwards;No rails;With walker Number of Stairs: 6  (3 stairs twice)    Exercise    End of Session PT - End of Session Equipment Utilized During Treatment: Gait belt;Back brace Activity Tolerance: Patient tolerated treatment well Patient left: in bed;with call bell in reach;with family/visitor present Nurse Communication: Mobility status for transfers;Mobility status for ambulation General Behavior During Session: [facility] for tasks performed Cognition: [facility] for tasks performed  Milana Kidney 01/12/2012, 1:19 PM  01/12/2012 Milana Kidney DPT PAGER: [phone] OFFICE: [phone]

## 2012-01-12 NOTE — Progress Notes (Signed)
CSW received consult for SNF. Per MD, pt will discharge home with home health services. Home health services arranged by [facility]. CSW is signing off as no further discharge needs identified. Please reconsult if a need arises prior to discharge.   Dede Query, MSW, Theresia Majors [phone]

## 2012-01-16 ENCOUNTER — Telehealth: Payer: Self-pay

## 2012-01-16 NOTE — Telephone Encounter (Signed)
Had multiple surgeries in last year under a lot of stress and pain med since Nov 2012. and 2 weeks ago started with run fingers thru hair clumps of hair come out. Also hair in hair brush. Pt has short hair and cannot see the scalp even thou loosing significant hair. Pt said was started on gabapentin 3 weeks ago also. Pt last seen 10/10/11. Pt uses [facility] and pt can be reached at [phone].

## 2012-01-19 ENCOUNTER — Other Ambulatory Visit: Payer: Self-pay | Admitting: *Deleted

## 2012-01-19 MED ORDER — METFORMIN HCL ER 500 MG PO TB24: 1000.0000 mg | ORAL_TABLET | Freq: Every day | ORAL | Status: DC

## 2012-01-19 NOTE — Telephone Encounter (Signed)
Stress most likely cause.. Doubt related to gabapentin. If she wants further evaluation, lab testing to eval... Have her make appt to discuss or refer to [facility].

## 2012-01-19 NOTE — Telephone Encounter (Signed)
Patient advised and she will call if it becomes worse

## 2012-02-09 ENCOUNTER — Telehealth: Payer: Self-pay | Admitting: Family Medicine

## 2012-02-09 DIAGNOSIS — E7800 Pure hypercholesterolemia, unspecified: Secondary | ICD-10-CM

## 2012-02-09 DIAGNOSIS — E039 Hypothyroidism, unspecified: Secondary | ICD-10-CM

## 2012-02-09 DIAGNOSIS — E119 Type 2 diabetes mellitus without complications: Secondary | ICD-10-CM

## 2012-02-09 DIAGNOSIS — D649 Anemia, unspecified: Secondary | ICD-10-CM

## 2012-02-09 NOTE — Telephone Encounter (Signed)
Message copied by Excell Seltzer on Mon Feb 09, 2012 10:44 PM ------      Message from: Alvina Chou      Created: Fri Feb 06, 2012  3:48 PM      Regarding: labs for [facility] May 15th       F/u labs

## 2012-02-11 ENCOUNTER — Other Ambulatory Visit (INDEPENDENT_AMBULATORY_CARE_PROVIDER_SITE_OTHER): Payer: BC Managed Care – PPO

## 2012-02-11 DIAGNOSIS — E039 Hypothyroidism, unspecified: Secondary | ICD-10-CM

## 2012-02-11 DIAGNOSIS — E119 Type 2 diabetes mellitus without complications: Secondary | ICD-10-CM

## 2012-02-11 DIAGNOSIS — E7800 Pure hypercholesterolemia, unspecified: Secondary | ICD-10-CM

## 2012-02-11 LAB — COMPREHENSIVE METABOLIC PANEL
ALT: 13 U/L (ref 0–35)
AST: 20 U/L (ref 0–37)
Albumin: 4.1 g/dL (ref 3.5–5.2)
Alkaline Phosphatase: 62 U/L (ref 39–117)
BUN: 13 mg/dL (ref 6–23)
CO2: 24 mEq/L (ref 19–32)
Calcium: 9.6 mg/dL (ref 8.4–10.5)
Chloride: 101 mEq/L (ref 96–112)
Creatinine, Ser: 0.6 mg/dL (ref 0.4–1.2)
GFR: 102.56 mL/min (ref >60.00)
Glucose, Bld: 112 mg/dL — ABNORMAL HIGH (ref 70–99)
Potassium: 4.2 mEq/L (ref 3.5–5.1)
Sodium: 137 mEq/L (ref 135–145)
Total Bilirubin: 0.3 mg/dL (ref 0.3–1.2)
Total Protein: 7.4 g/dL (ref 6.0–8.3)

## 2012-02-11 LAB — TSH: TSH: 1.17 u[IU]/mL (ref 0.35–5.50)

## 2012-02-11 LAB — LIPID PANEL
Cholesterol: 103 mg/dL (ref 0–200)
HDL: 54.1 mg/dL (ref >39.00)
LDL Cholesterol: 33 mg/dL (ref 0–99)
Total CHOL/HDL Ratio: 2
Triglycerides: 78 mg/dL (ref 0.0–149.0)
VLDL: 15.6 mg/dL (ref 0.0–40.0)

## 2012-02-11 LAB — HEMOGLOBIN A1C: Hgb A1c MFr Bld: 5.9 % (ref 4.6–6.5)

## 2012-02-17 ENCOUNTER — Encounter: Payer: Self-pay | Admitting: Family Medicine

## 2012-02-17 ENCOUNTER — Ambulatory Visit (INDEPENDENT_AMBULATORY_CARE_PROVIDER_SITE_OTHER): Payer: BC Managed Care – PPO | Admitting: Family Medicine

## 2012-02-17 VITALS — BP 120/72 | HR 87 | Temp 97.7°F | Ht 64.0 in | Wt 358.1 lb

## 2012-02-17 DIAGNOSIS — E039 Hypothyroidism, unspecified: Secondary | ICD-10-CM

## 2012-02-17 DIAGNOSIS — E7800 Pure hypercholesterolemia, unspecified: Secondary | ICD-10-CM

## 2012-02-17 DIAGNOSIS — I10 Essential (primary) hypertension: Secondary | ICD-10-CM

## 2012-02-17 DIAGNOSIS — E119 Type 2 diabetes mellitus without complications: Secondary | ICD-10-CM

## 2012-02-17 MED ORDER — CELECOXIB 200 MG PO CAPS: 200.0000 mg | ORAL_CAPSULE | Freq: Two times a day (BID) | ORAL | Status: DC

## 2012-02-17 NOTE — Patient Instructions (Addendum)
Try to decrease metformin to 1 tab let daily, while increasing victoza to take every day. Goal blood sugars between 80-120. If blood sugars are running to high you can increase victoza to 1.8 mcg inj daily. If blood sugars running to low.. <60, then you can stop metformin. Hopefully we will see some weight loss. Move as much possible. Water exercise.  Follow up DM in 3 months with labs prior

## 2012-02-17 NOTE — Progress Notes (Signed)
Subjective:    Patient ID: Amy Barnes, female    DOB: 1951/01/01, 61 y.o.   MRN: [id number]  HPI 61 year old female with left hip revision 11/29.  Had wound infection... Had PICC line placed for home IV antibiotics DORIPENEM IV.  Has wound vac in place.  Plan in for 6 months of oral antibiotics following.  Using morphine 15 mg BID. Had recently decreased morphine from 30 mg daily.  Nucenta using one tab every 4-6 hours for breakthru pain. Using oin last few days regularly.  (Percocet and vicodin not effective)   4/11 back surgery, Dr. Lovell Sheehan: Fusion. Recovering well.  on morphine, oxycodone for breakthrough. Tried to come off celebrex, but restarted because helped other joints.  Diabetes: Well controlled  On victoza , using if BS is above 130, no weight loss. Lab Results  Component Value Date   HGBA1C 5.9 02/11/2012  Using medications without difficulties:Good Hypoglycemic episodes:None Hyperglycemic episodes:None Feet problems:None Blood Sugars averaging:checking daily, FBS 98-110, highest 145 eye exam within last year:  Elevated Cholesterol: Very well controlled. On Simvastatin. Lab Results  Component Value Date   CHOL 103 02/11/2012   HDL 54.10 02/11/2012   LDLCALC 33 02/11/2012   TRIG 78.0 02/11/2012   CHOLHDL 2 02/11/2012  Using medications without problems: None Muscle aches: None Diet compliance:Moderate Exercise:walking, 2 canes or walker. Other complaints: shin pain resolved.  Hypertension:   Well controlled on curent meds.  Using medication without problems or lightheadedness: None Chest pain with exertion:None Edema:mild Short of breath:None Average home XLK:GMWN checking Other issues:   Hypothyroid  Lab Results  Component Value Date   TSH 1.17 02/11/2012     Review of Systems  Constitutional: Negative for fever and fatigue.  HENT: Negative for ear pain.   Eyes: Negative for pain.  Respiratory: Negative for chest tightness and shortness of breath.     Cardiovascular: Negative for chest pain, palpitations and leg swelling.  Gastrointestinal: Negative for abdominal pain.  Genitourinary: Negative for dysuria.       Objective:   Physical Exam  Constitutional: Vital signs are normal. She appears well-developed and well-nourished. She is cooperative.  Non-toxic appearance. She does not appear ill. No distress.       Morbidly obese, ambulation slow and limited  HENT:  Head: Normocephalic.  Right Ear: Hearing, tympanic membrane, external ear and ear canal normal. Tympanic membrane is not erythematous, not retracted and not bulging.  Left Ear: Hearing, tympanic membrane, external ear and ear canal normal. Tympanic membrane is not erythematous, not retracted and not bulging.  Nose: No mucosal edema or rhinorrhea. Right sinus exhibits no maxillary sinus tenderness and no frontal sinus tenderness. Left sinus exhibits no maxillary sinus tenderness and no frontal sinus tenderness.  Mouth/Throat: Uvula is midline, oropharynx is clear and moist and mucous membranes are normal.  Eyes: Conjunctivae, EOM and lids are normal. Pupils are equal, round, and reactive to light. No foreign bodies found.  Neck: Trachea normal and normal range of motion. Neck supple. Carotid bruit is not present. No mass and no thyromegaly present.  Cardiovascular: Normal rate, regular rhythm, S1 normal, S2 normal, normal heart sounds, intact distal pulses and normal pulses.  Exam reveals no gallop and no friction rub.   No murmur heard. Pulmonary/Chest: Effort normal and breath sounds normal. Not tachypneic. No respiratory distress. She has no decreased breath sounds. She has no wheezes. She has no rhonchi. She has no rales.  Abdominal: Soft. Normal appearance and bowel sounds are  normal. There is no tenderness.  Neurological: She is alert.  Skin: Skin is warm, dry and intact. No rash noted.  Psychiatric: Her speech is normal and behavior is normal. Judgment and thought content  normal. Her mood appears not anxious. Cognition and memory are normal. She does not exhibit a depressed mood.          Assessment & Plan:

## 2012-02-27 NOTE — Assessment & Plan Note (Addendum)
Will try to increase victoza for weight loss. Move as much possible. Water exercise.

## 2012-02-27 NOTE — Assessment & Plan Note (Signed)
Well controlled. Continue current medication.  

## 2012-02-27 NOTE — Assessment & Plan Note (Addendum)
Well controlledbut in efforts to lose weight.Marland KitchenMarland KitchenTry to decrease metformin to 1 tab let daily, while increasing victoza to take every day. Goal blood sugars between 80-120. If blood sugars are running to high you can increase victoza to 1.8 mcg inj daily. If blood sugars running to low.. <60, then you can stop metformin.

## 2012-05-03 ENCOUNTER — Other Ambulatory Visit: Payer: Self-pay | Admitting: *Deleted

## 2012-05-03 MED ORDER — SIMVASTATIN 40 MG PO TABS: 40.0000 mg | ORAL_TABLET | Freq: Every evening | ORAL | Status: DC

## 2012-05-14 ENCOUNTER — Other Ambulatory Visit (INDEPENDENT_AMBULATORY_CARE_PROVIDER_SITE_OTHER): Payer: BC Managed Care – PPO

## 2012-05-14 DIAGNOSIS — E119 Type 2 diabetes mellitus without complications: Secondary | ICD-10-CM

## 2012-05-14 LAB — HEMOGLOBIN A1C: Hgb A1c MFr Bld: 6.1 % (ref 4.6–6.5)

## 2012-05-21 ENCOUNTER — Ambulatory Visit (INDEPENDENT_AMBULATORY_CARE_PROVIDER_SITE_OTHER): Payer: BC Managed Care – PPO | Admitting: Family Medicine

## 2012-05-21 ENCOUNTER — Encounter: Payer: Self-pay | Admitting: Family Medicine

## 2012-05-21 VITALS — BP 138/88 | HR 84 | Temp 97.9°F | Wt 339.0 lb

## 2012-05-21 DIAGNOSIS — E1149 Type 2 diabetes mellitus with other diabetic neurological complication: Secondary | ICD-10-CM

## 2012-05-21 DIAGNOSIS — I10 Essential (primary) hypertension: Secondary | ICD-10-CM

## 2012-05-21 DIAGNOSIS — E1142 Type 2 diabetes mellitus with diabetic polyneuropathy: Secondary | ICD-10-CM

## 2012-05-21 DIAGNOSIS — G909 Disorder of the autonomic nervous system, unspecified: Secondary | ICD-10-CM

## 2012-05-21 DIAGNOSIS — E7800 Pure hypercholesterolemia, unspecified: Secondary | ICD-10-CM

## 2012-05-21 MED ORDER — LIRAGLUTIDE 18 MG/3ML ~~LOC~~ SOLN: 1.8000 mg | Freq: Every day | SUBCUTANEOUS | Status: DC

## 2012-05-21 NOTE — Patient Instructions (Addendum)
Follow BPs at home, get cuff at home... Call if consistently >130/80. Increase victoza to 1.8  mcg daily. Continue current dose of metformin.

## 2012-05-21 NOTE — Assessment & Plan Note (Signed)
Good control on daily victoza and lower dose metformin, WIll try to increase victoza to help with weight loss. If CBGs are lower, hold metformin. Follow up in 3 months with A1C.

## 2012-05-21 NOTE — Assessment & Plan Note (Signed)
Good control last check.  Recheck in 3 months.

## 2012-05-21 NOTE — Assessment & Plan Note (Signed)
Borderline control and possibly worse control at home/ Get cuff, follow BPs at home. May need med adjustment.

## 2012-05-21 NOTE — Progress Notes (Signed)
61 year old female with left hip revision 11/29.  Had wound infection... Had PICC line placed for home IV antibiotics DORIPENEM IV.  Has wound vac in place.  Plan in for 6 months of oral antibiotics following.  Using morphine 15 mg BID. Had recently decreased morphine from 30 mg daily.  Nucenta using one tab every 4-6 hours for breakthru pain. Using oin last few days regularly.  (Percocet and vicodin not effective)  4/11 back surgery, Dr. Lovell Sheehan: Fusion. Recovering well.  Weaned off morphine now, trying to decrease oxcodone (using one a day). Using [facility] for breakthru  On celebrex.  Has follow up next week)  Diabetes: Well controlled, In effort to lose more wieght.. Increased victoza (now taking daily, 1.2 mcg, occ misses and decreased metfomin  To only 500 mg daily as discussed at last OV)  No increase in SE. Slight increase in A1C.  HAS lost 20 more lbs! Lab Results  Component Value Date   HGBA1C 6.1 05/14/2012  Hypoglycemic episodes:None  Hyperglycemic episodes:None  Feet problems:None  Blood Sugars averaging:checking daily, FBS 98-110, highest 145  eye exam within last year:  Wt Readings from Last 3 Encounters:  05/21/12 339 lb (153.769 kg)  02/17/12 358 lb 1.9 oz (162.442 kg)  01/10/12 355 lb 13.2 oz (161.4 kg)     Elevated Cholesterol: Very well controlled. On Simvastatin.  Lab Results  Component Value Date   CHOL 103 02/11/2012   HDL 54.10 02/11/2012   LDLCALC 33 02/11/2012   TRIG 78.0 02/11/2012   CHOLHDL 2 02/11/2012   Using medications without problems: None  Muscle aches: None  Diet compliance:Moderate  Exercise:walking, 2 canes or walker.  Other complaints: shin pain resolved.   Hypertension: Well controlled on curent meds.  Using medication without problems or lightheadedness: None  Chest pain with exertion:None  Edema:mild  Short of breath:None  Average home BPs:At other MD appt.Marland Kitchen Has been 110-50 to 160/80s.  Review of Systems  Constitutional: Negative for  fever and fatigue.  HENT: Negative for ear pain.  Eyes: Negative for pain.  Respiratory: Negative for chest tightness and shortness of breath.  Cardiovascular: Negative for chest pain, palpitations and leg swelling.  Gastrointestinal: Negative for abdominal pain.  Genitourinary: Negative for dysuria.    Objective:   Physical Exam  Constitutional: Vital signs are normal. She appears well-developed and well-nourished. She is cooperative. Non-toxic appearance. She does not appear ill. No distress.  Morbidly obese, ambulation slow and limited  HENT:  Head: Normocephalic.  Right Ear: Hearing, tympanic membrane, external ear and ear canal normal. Tympanic membrane is not erythematous, not retracted and not bulging.  Left Ear: Hearing, tympanic membrane, external ear and ear canal normal. Tympanic membrane is not erythematous, not retracted and not bulging.  Nose: No mucosal edema or rhinorrhea. Right sinus exhibits no maxillary sinus tenderness and no frontal sinus tenderness. Left sinus exhibits no maxillary sinus tenderness and no frontal sinus tenderness.  Mouth/Throat: Uvula is midline, oropharynx is clear and moist and mucous membranes are normal.  Eyes: Conjunctivae, EOM and lids are normal. Pupils are equal, round, and reactive to light. No foreign bodies found.  Neck: Trachea normal and normal range of motion. Neck supple. Carotid bruit is not present. No mass and no thyromegaly present.  Cardiovascular: Normal rate, regular rhythm, S1 normal, S2 normal, normal heart sounds, intact distal pulses and normal pulses. Exam reveals no gallop and no friction rub.  No murmur heard.  Pulmonary/Chest: Effort normal and breath sounds normal. Not  tachypneic. No respiratory distress. She has no decreased breath sounds. She has no wheezes. She has no rhonchi. She has no rales.  Abdominal: Soft. Normal appearance and bowel sounds are normal. There is no tenderness.  Neurological: She is alert.  Skin:  Skin is warm, dry and intact. No rash noted.  Psychiatric: Her speech is normal and behavior is normal. Judgment and thought content normal. Her mood appears not anxious. Cognition and memory are normal. She does not exhibit a depressed mood.

## 2012-05-26 ENCOUNTER — Other Ambulatory Visit: Payer: Self-pay | Admitting: Family Medicine

## 2012-05-30 ENCOUNTER — Other Ambulatory Visit: Payer: Self-pay | Admitting: Family Medicine

## 2012-06-30 ENCOUNTER — Other Ambulatory Visit: Payer: Self-pay | Admitting: Family Medicine

## 2012-06-30 ENCOUNTER — Emergency Department: Payer: Self-pay | Admitting: Emergency Medicine

## 2012-06-30 LAB — CBC
HCT: 33.5 % — ABNORMAL LOW (ref 35.0–47.0)
HGB: 11.5 g/dL — ABNORMAL LOW (ref 12.0–16.0)
MCH: 28.8 pg (ref 26.0–34.0)
MCHC: 34.4 g/dL (ref 32.0–36.0)
MCV: 84 fL (ref 80–100)
Platelet: 261 10*3/uL (ref 150–440)
RBC: 4 10*6/uL (ref 3.80–5.20)
RDW: 15.9 % — ABNORMAL HIGH (ref 11.5–14.5)
WBC: 11.3 10*3/uL — ABNORMAL HIGH (ref 3.6–11.0)

## 2012-06-30 LAB — BASIC METABOLIC PANEL
Anion Gap: 8 (ref 7–16)
BUN: 15 mg/dL (ref 7–18)
Calcium, Total: 9.4 mg/dL (ref 8.5–10.1)
Chloride: 103 mmol/L (ref 98–107)
Co2: 27 mmol/L (ref 21–32)
Creatinine: 0.91 mg/dL (ref 0.60–1.30)
EGFR (African American): >60
EGFR (Non-African Amer.): >60
Glucose: 181 mg/dL — ABNORMAL HIGH (ref 65–99)
Osmolality: 281 (ref 275–301)
Potassium: 3.9 mmol/L (ref 3.5–5.1)
Sodium: 138 mmol/L (ref 136–145)

## 2012-06-30 LAB — CK TOTAL AND CKMB (NOT AT ARMC)
CK, Total: 57 U/L (ref 21–215)
CK-MB: 0.6 ng/mL (ref 0.5–3.6)

## 2012-06-30 LAB — TROPONIN I: Troponin-I: 0.03 ng/mL

## 2012-07-01 LAB — URINALYSIS, COMPLETE
Bilirubin,UR: NEGATIVE
Blood: NEGATIVE
Glucose,UR: NEGATIVE mg/dL (ref 0–75)
Ketone: NEGATIVE
Leukocyte Esterase: NEGATIVE
Nitrite: NEGATIVE
Ph: 6 (ref 4.5–8.0)
Protein: NEGATIVE
RBC,UR: <1 /HPF (ref 0–5)
Specific Gravity: 1.035 (ref 1.003–1.030)
Squamous Epithelial: <1
WBC UR: 7 /HPF (ref 0–5)

## 2012-07-27 ENCOUNTER — Telehealth: Payer: Self-pay | Admitting: *Deleted

## 2012-07-27 NOTE — Telephone Encounter (Signed)
received a request for prior auth on pt's truetest test strips, I called insurance [phone]) for prior auth forms. Forms received and one question on form asks if pt has tried other test strips (one touch ultra, one touch verio, or surestep pro) I didn't see rx for any other strips in chart, so I called pt and left message on cell [phone]) for pt to return call.

## 2012-07-28 ENCOUNTER — Ambulatory Visit (INDEPENDENT_AMBULATORY_CARE_PROVIDER_SITE_OTHER): Payer: BC Managed Care – PPO | Admitting: Family Medicine

## 2012-07-28 ENCOUNTER — Encounter: Payer: Self-pay | Admitting: Family Medicine

## 2012-07-28 VITALS — BP 132/78 | HR 100 | Temp 98.2°F | Wt 342.8 lb

## 2012-07-28 DIAGNOSIS — R3915 Urgency of urination: Secondary | ICD-10-CM | POA: Insufficient documentation

## 2012-07-28 LAB — POCT URINALYSIS DIPSTICK
Bilirubin, UA: NEGATIVE
Blood, UA: NEGATIVE
Glucose, UA: NEGATIVE
Ketones, UA: NEGATIVE
Nitrite, UA: NEGATIVE
Spec Grav, UA: <=1.005
Urobilinogen, UA: 0.2
pH, UA: 8

## 2012-07-28 MED ORDER — CIPROFLOXACIN HCL 500 MG PO TABS: 500.0000 mg | ORAL_TABLET | Freq: Two times a day (BID) | ORAL | Status: DC

## 2012-07-28 NOTE — Progress Notes (Signed)
  Subjective:    Patient ID: Amy Barnes, female    DOB: 29-Oct-1950, 61 y.o.   MRN: [id number]  Urinary Tract Infection    CC: UTI?  1d h/o R lower back achey pain.  Overnight significantly increased frequency.  Feels warm but no documented fever.  + nauseated.  Trying to stay hydrated.  Urine darker this morning.  + worsening incontinence from urgency.  No chills, dysuria, vomiting, flank or abd pain.  No hematuria.  chronic pain - recent L hip surgery with revision and anterior thigh wound infection, also with recent lumbar surgery L3-5 (12/2011).  On narcotics for this prescribed by Dr. Lovell Sheehan.  Follows with Dr. Lovell Sheehan at beginning of the year.  Denies numbness or weakness of legs.    Past Medical History  Diagnosis Date  . Anemia   . Osteoarthritis   . Asthma   . Diabetes mellitus   . Hypothyroid   . GERD (gastroesophageal reflux disease)   . Allergic rhinitis   . Hypertension   . Bipolar disorder     takes Synthroid meds for Bipolar     Review of Systems Per HPI    Objective:   Physical Exam  Nursing note and vitals reviewed. Constitutional: She appears well-developed and well-nourished. She appears distressed.       Difficult exam 2/2 inability to get on exam table. Uncomfortable 2/2 hip pain  Abdominal: Soft. Bowel sounds are normal. She exhibits no distension and no mass. There is no hepatosplenomegaly. There is no tenderness. There is no rebound, no guarding and no CVA tenderness.       obese  Musculoskeletal:       No significant midline spine tenderness. Tender to palpation left lower back lateral to sacroiliac joint with some guarding  Skin: Skin is dry.       Assessment & Plan:

## 2012-07-28 NOTE — Patient Instructions (Addendum)
Sounds like this is a urinary infection.  Treat with 5 days of cipro twice daily.  Push fluids and plenty of rest.  Urine culture sent off today. If not improving with this, return to see Dr. Lovell Sheehan for further evaluation. I hope you start feeling better.

## 2012-07-28 NOTE — Assessment & Plan Note (Addendum)
Worsening urgency and frequency over last several days associated with lower lateral left back pain. Checked UA today small LE.  Micro: 1-5 epi and WBC Will treat as UTI with 5d course of cipro, send urine culture given complicated history. 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  If not better, would recommend she return to see Dr. Lovell Sheehan for further evaluation.

## 2012-07-29 NOTE — Telephone Encounter (Signed)
Pt returned call, she states she has been on true test strips for a few years, was previously on a different brand but it was too expensive, believes it may  have been the one touch ultra. She would prefer to not change meters and strips because it is more affordable for her.  I will place prior auth paperwork in your inbox.

## 2012-07-30 LAB — URINE CULTURE: Colony Count: >=100000

## 2012-08-11 NOTE — Telephone Encounter (Signed)
[facility]; spoke with Bruce @ [facility] [phone] has not received PA form. Refaxed. Spoke with Heidi at [facility] with update.

## 2012-08-13 NOTE — Telephone Encounter (Signed)
Prior auth approved for test strips;letter placed on Dr Albertina Senegal desk for signature and scanning.

## 2012-08-15 ENCOUNTER — Emergency Department: Payer: Self-pay | Admitting: Emergency Medicine

## 2012-08-17 LAB — HM DIABETES EYE EXAM: HM Diabetic Eye Exam: NORMAL

## 2012-08-20 ENCOUNTER — Telehealth: Payer: Self-pay | Admitting: Family Medicine

## 2012-08-20 DIAGNOSIS — E7800 Pure hypercholesterolemia, unspecified: Secondary | ICD-10-CM

## 2012-08-20 DIAGNOSIS — E1142 Type 2 diabetes mellitus with diabetic polyneuropathy: Secondary | ICD-10-CM

## 2012-08-20 DIAGNOSIS — I10 Essential (primary) hypertension: Secondary | ICD-10-CM

## 2012-08-20 NOTE — Telephone Encounter (Signed)
Message copied by Excell Seltzer on Fri Aug 20, 2012  3:34 PM ------      Message from: Baldomero Lamy      Created: Wed Aug 18, 2012 10:28 AM      Regarding: f/u labs 11/ 27/13       Pt is scheduled for f/u labs, do you want anything else in addition to the A1c and the lipid mentioned in the 05/21/12 ov?

## 2012-08-25 ENCOUNTER — Other Ambulatory Visit: Payer: BC Managed Care – PPO

## 2012-08-26 ENCOUNTER — Other Ambulatory Visit: Payer: Self-pay | Admitting: Family Medicine

## 2012-08-27 ENCOUNTER — Encounter (HOSPITAL_COMMUNITY): Payer: Self-pay | Admitting: Emergency Medicine

## 2012-08-27 ENCOUNTER — Emergency Department (HOSPITAL_COMMUNITY)
Admission: EM | Admit: 2012-08-27 | Discharge: 2012-08-27 | Disposition: A | Discharge status: Home / Self Care | Payer: BC Managed Care – PPO | Attending: Emergency Medicine | Admitting: Emergency Medicine

## 2012-08-27 ENCOUNTER — Other Ambulatory Visit: Payer: Self-pay | Admitting: *Deleted

## 2012-08-27 DIAGNOSIS — IMO0002 Reserved for concepts with insufficient information to code with codable children: Secondary | ICD-10-CM | POA: Insufficient documentation

## 2012-08-27 DIAGNOSIS — Z79899 Other long term (current) drug therapy: Secondary | ICD-10-CM | POA: Insufficient documentation

## 2012-08-27 DIAGNOSIS — M25569 Pain in unspecified knee: Secondary | ICD-10-CM

## 2012-08-27 DIAGNOSIS — Y9389 Activity, other specified: Secondary | ICD-10-CM | POA: Insufficient documentation

## 2012-08-27 DIAGNOSIS — I10 Essential (primary) hypertension: Secondary | ICD-10-CM | POA: Insufficient documentation

## 2012-08-27 DIAGNOSIS — E039 Hypothyroidism, unspecified: Secondary | ICD-10-CM | POA: Insufficient documentation

## 2012-08-27 DIAGNOSIS — X58XXXA Exposure to other specified factors, initial encounter: Secondary | ICD-10-CM | POA: Insufficient documentation

## 2012-08-27 DIAGNOSIS — M1710 Unilateral primary osteoarthritis, unspecified knee: Secondary | ICD-10-CM | POA: Insufficient documentation

## 2012-08-27 DIAGNOSIS — Z8719 Personal history of other diseases of the digestive system: Secondary | ICD-10-CM | POA: Insufficient documentation

## 2012-08-27 DIAGNOSIS — M1990 Unspecified osteoarthritis, unspecified site: Secondary | ICD-10-CM

## 2012-08-27 DIAGNOSIS — E119 Type 2 diabetes mellitus without complications: Secondary | ICD-10-CM | POA: Insufficient documentation

## 2012-08-27 DIAGNOSIS — R1110 Vomiting, unspecified: Secondary | ICD-10-CM | POA: Insufficient documentation

## 2012-08-27 DIAGNOSIS — Z8659 Personal history of other mental and behavioral disorders: Secondary | ICD-10-CM | POA: Insufficient documentation

## 2012-08-27 DIAGNOSIS — Z7982 Long term (current) use of aspirin: Secondary | ICD-10-CM | POA: Insufficient documentation

## 2012-08-27 DIAGNOSIS — Z862 Personal history of diseases of the blood and blood-forming organs and certain disorders involving the immune mechanism: Secondary | ICD-10-CM | POA: Insufficient documentation

## 2012-08-27 DIAGNOSIS — Z87891 Personal history of nicotine dependence: Secondary | ICD-10-CM | POA: Insufficient documentation

## 2012-08-27 DIAGNOSIS — Y929 Unspecified place or not applicable: Secondary | ICD-10-CM | POA: Insufficient documentation

## 2012-08-27 DIAGNOSIS — S83209A Unspecified tear of unspecified meniscus, current injury, unspecified knee, initial encounter: Secondary | ICD-10-CM

## 2012-08-27 DIAGNOSIS — J45909 Unspecified asthma, uncomplicated: Secondary | ICD-10-CM | POA: Insufficient documentation

## 2012-08-27 MED ORDER — HYDROMORPHONE HCL PF 2 MG/ML IJ SOLN
2.0000 mg | Freq: Once | INTRAMUSCULAR | Status: AC
  Administered 2012-08-27: 2 mg via INTRAVENOUS
  Filled 2012-08-27: qty 1

## 2012-08-27 MED ORDER — SIMVASTATIN 40 MG PO TABS: 40.0000 mg | ORAL_TABLET | Freq: Every evening | ORAL | Status: DC

## 2012-08-27 MED ORDER — HYDROMORPHONE HCL PF 2 MG/ML IJ SOLN: 2.0000 mg | Freq: Once | INTRAMUSCULAR | Status: DC

## 2012-08-27 MED ORDER — IBUPROFEN 800 MG PO TABS: 800.0000 mg | ORAL_TABLET | Freq: Three times a day (TID) | ORAL | Status: DC | PRN

## 2012-08-27 MED ORDER — IBUPROFEN 800 MG PO TABS
800.0000 mg | ORAL_TABLET | Freq: Once | ORAL | Status: AC
  Administered 2012-08-27: 800 mg via ORAL
  Filled 2012-08-27: qty 1

## 2012-08-27 NOTE — Progress Notes (Signed)
Orthopedic Tech Progress Note Patient Details:  Amy Barnes 1950-11-19 [id number]  Ortho Devices Type of Ortho Device: Crutches Ortho Device/Splint Interventions: Application   Nikki Dom 08/27/2012, 10:54 PM

## 2012-08-27 NOTE — ED Provider Notes (Signed)
History     CSN: [id number]  Arrival date & time 08/27/12  2030   First MD Initiated Contact with Patient 08/27/12 2036      Chief Complaint  Patient presents with  . Knee Pain    (Consider location/radiation/quality/duration/timing/severity/associated sxs/prior treatment) Patient is a 61 y.o. female presenting with knee pain. The history is provided by the patient.  Knee Pain This is a chronic problem. The current episode started 1 to 4 weeks ago. The problem occurs constantly. The problem has been gradually worsening. Associated symptoms include vomiting (Once today). Pertinent negatives include no abdominal pain, chest pain, chills, fever or nausea. The symptoms are aggravated by walking (Bearing weight). She has tried nothing for the symptoms. The treatment provided no relief.    Past Medical History  Diagnosis Date  . Anemia   . Osteoarthritis   . Asthma   . Diabetes mellitus   . Hypothyroid   . GERD (gastroesophageal reflux disease)   . Allergic rhinitis   . Hypertension   . Bipolar disorder     takes Synthroid meds for Bipolar    Past Surgical History  Procedure Date  . Total hip arthroplasty 2002    Left  . Partial hysterectomy 1982    for mennorhagia, no cervix  . Foot surgery 1982    bone spur  . Neck surgery     Herniated disk C2,3,4  . Tmj arthroplasty 1982  . Hospitalized     for bipolar  . Abdominal hysterectomy   . Joint replacement     2002  . Carpal tunnel release 2002    Family History  Problem Relation Age of Onset  . Aneurysm Father   . Alcohol abuse Father   . Heart attack      Siblings  . Coronary artery disease      Family history 1st degree relative <50/Family history 1st degree relative <60/Siblings  . Anesthesia problems Neg Hx   . Hypotension Neg Hx   . Malignant hyperthermia Neg Hx   . Pseudochol deficiency Neg Hx     History  Substance Use Topics  . Smoking status: Former Smoker -- 1.5 packs/day    Types: Cigarettes      Quit date: 04/29/2009  . Smokeless tobacco: Never Used  . Alcohol Use: Yes     Comment: very rare    OB History    Grav Para Term Preterm Abortions TAB SAB Ect Mult Living                  Review of Systems  Constitutional: Negative for fever and chills.  Cardiovascular: Negative for chest pain.  Gastrointestinal: Positive for vomiting (Once today). Negative for nausea and abdominal pain.  All other systems reviewed and are negative.    Allergies  Cephalexin and Sulfonamide derivatives  Home Medications   Current Outpatient Rx  Name  Route  Sig  Dispense  Refill  . ALBUTEROL SULFATE HFA 108 (90 BASE) MCG/ACT IN AERS   Inhalation   Inhale 2 puffs into the lungs every 6 (six) hours as needed. For shortness of breath         . ASPIRIN 81 MG PO TBEC   Oral   Take 81 mg by mouth daily.           Marland Kitchen CALCIUM + D PO   Oral   Take 1 tablet by mouth daily.         . CELEBREX 200 MG PO CAPS  TAKE ONE CAPSULE BY MOUTH TWICE A DAY   180 capsule   0   . CIPROFLOXACIN HCL 500 MG PO TABS   Oral   Take 1 tablet (500 mg total) by mouth 2 (two) times daily.   10 tablet   0   . CYCLOBENZAPRINE HCL 10 MG PO TABS   Oral   Take 10 mg by mouth 3 (three) times daily as needed. For muscle pain         . DOCUSATE SODIUM 100 MG PO CAPS   Oral   Take 300 mg by mouth at bedtime.         . FUROSEMIDE 80 MG PO TABS   Oral   Take 40 mg by mouth 2 (two) times daily as needed. For fluid         . IRON PO   Oral   Take 75 mg by mouth daily.         Marland Kitchen LAMOTRIGINE 200 MG PO TABS   Oral   Take 200 mg by mouth daily.           Marland Kitchen LEVOTHYROXINE SODIUM 150 MCG PO TABS   Oral   Take 150 mcg by mouth daily.         Marland Kitchen LIRAGLUTIDE 18 MG/3ML [place] SOLN   Subcutaneous   Inject 0.3 mLs (1.8 mg total) into the skin daily.   6 mL   11   . LOSARTAN POTASSIUM 50 MG PO TABS   Oral   Take 50 mg by mouth daily.         Marland Kitchen METFORMIN HCL ER 500 MG PO TB24   Oral    Take 500 mg by mouth 3 (three) times daily.          Marland Kitchen MONTELUKAST SODIUM 10 MG PO TABS   Oral   Take 1 tablet (10 mg total) by mouth at bedtime.   90 tablet   3   . MORPHINE SULFATE ER 15 MG PO TBCR   Oral   Take 30 mg by mouth 2 (two) times daily.          . CENTRUM SILVER PO   Oral   Take 1 tablet by mouth daily.         Marland Kitchen OXCARBAZEPINE 300 MG PO TABS   Oral   Take 300-600 mg by mouth 2 (two) times daily. 1 in the morning and 2 every evening         . OXYCODONE-ACETAMINOPHEN 10-325 MG PO TABS   Oral   Take 1 tablet by mouth every 6 (six) hours as needed. For pain         . PREDNISONE 10 MG PO TABS   Oral   Take 10 mg by mouth daily. Take for 5 days. Started medication on 08/25/12         . PROMETHAZINE HCL 25 MG PO TABS   Oral   Take 25 mg by mouth every 6 (six) hours as needed. For nausea         . SIMVASTATIN 40 MG PO TABS   Oral   Take 1 tablet (40 mg total) by mouth every evening.   90 tablet   0     BP 124/65  Pulse 82  Temp 97.9 F (36.6 C) (Oral)  Resp 18  SpO2 96%  Physical Exam  Nursing note and vitals reviewed. Constitutional: She is oriented to person, place, and time. She appears well-developed and well-nourished. No distress.  Morbidly obese  HENT:  Head: Normocephalic and atraumatic.  Eyes: EOM are normal. Pupils are equal, round, and reactive to light.  Neck: Normal range of motion. Neck supple.  Cardiovascular: Normal rate and regular rhythm.  Exam reveals no friction rub.   No murmur heard. Pulmonary/Chest: Effort normal and breath sounds normal. No respiratory distress. She has no wheezes. She has no rales.  Abdominal: Soft. She exhibits no distension. There is no tenderness. There is no rebound.  Musculoskeletal: Normal range of motion. She exhibits no edema.       Right knee: She exhibits normal range of motion, no effusion, no ecchymosis, no deformity and no laceration. tenderness (lateral joint.) found.    Neurological: She is alert and oriented to person, place, and time.  Skin: She is not diaphoretic.    ED Course  Procedures (including critical care time)  Labs Reviewed - No data to display No results found.   1. Knee pain   2. Osteoarthritis   3. Meniscus tear       MDM   The patient diagnosed with a meniscal tear to reschedule. She presents tonight with right knee pain. She has had no fall or recent trauma. She is able to get up and walk and uses a walker and/or cane, however she is experiencing increasing pain in her right knee. His x-ray evaluated in ED for this within the past week. An MRI showed some inflammation and a meniscal tear. No bony deformities are noted at that time. She was also tested for a DVT that was negative per the family. Patient here with continued pain. She had one episode of vomiting today during the pain. Her vital signs are stable. She is morbidly obese. Her right knee is tender laterally to palpation. No large effusion or marked swelling when compared to the other side. She is neurovascularly intact distally. She's not swollen and not appear to have a DVT in her lower extremity per my exam. No need for emergent imaging at this time with no recent fall or trauma. Patient seems to have acute on chronic knee pain. Given 2 mg of Dilaudid IV for her pain. She was also started on Motrin to help with her inflammation that was noted on her MRI. She is followed in orthopedics and instructed to keep his followup. She is given 800 mg of Motrin prescription and advised to take it 3 times a day to help with inflammation and pain. I did not give her any further narcotic pain medicine she has oxycodone and morphine at home she takes for her chronic back and leg pain.        Elwin Mocha, MD 08/28/12 [phone]

## 2012-08-27 NOTE — ED Notes (Signed)
Ortho made aware  

## 2012-08-27 NOTE — ED Notes (Signed)
Per EMS, she was diagnosed a few weeks ago with Meniscus.  She does not have surgery scheduled. She has PO pain medication but she has not been able to keep the medication down. She has n/v. She has been given 4mg  Zofran, 30mg  of Toradol, and of Fentanyl. 22g IV R hand. Vitals: 180/102, 88 HR, Resp 22, and O2 96-98%. She has hx of DM and Sleep Apnea. Allergies to Sulfa and Keflex.

## 2012-08-28 ENCOUNTER — Encounter (HOSPITAL_COMMUNITY): Payer: Self-pay | Admitting: *Deleted

## 2012-08-28 ENCOUNTER — Inpatient Hospital Stay (HOSPITAL_COMMUNITY)
Admission: EM | Admit: 2012-08-28 | Discharge: 2012-09-11 | DRG: 221 | Disposition: A | Discharge status: Home Health | Payer: BC Managed Care – PPO | Attending: Internal Medicine | Admitting: Internal Medicine

## 2012-08-28 DIAGNOSIS — D649 Anemia, unspecified: Secondary | ICD-10-CM

## 2012-08-28 DIAGNOSIS — E669 Obesity, unspecified: Secondary | ICD-10-CM

## 2012-08-28 DIAGNOSIS — S83206A Unspecified tear of unspecified meniscus, current injury, right knee, initial encounter: Secondary | ICD-10-CM

## 2012-08-28 DIAGNOSIS — S83289A Other tear of lateral meniscus, current injury, unspecified knee, initial encounter: Secondary | ICD-10-CM | POA: Diagnosis present

## 2012-08-28 DIAGNOSIS — Z87891 Personal history of nicotine dependence: Secondary | ICD-10-CM

## 2012-08-28 DIAGNOSIS — E7800 Pure hypercholesterolemia, unspecified: Secondary | ICD-10-CM

## 2012-08-28 DIAGNOSIS — E1149 Type 2 diabetes mellitus with other diabetic neurological complication: Secondary | ICD-10-CM

## 2012-08-28 DIAGNOSIS — M25559 Pain in unspecified hip: Secondary | ICD-10-CM

## 2012-08-28 DIAGNOSIS — I10 Essential (primary) hypertension: Secondary | ICD-10-CM

## 2012-08-28 DIAGNOSIS — M79606 Pain in leg, unspecified: Secondary | ICD-10-CM

## 2012-08-28 DIAGNOSIS — M5412 Radiculopathy, cervical region: Secondary | ICD-10-CM

## 2012-08-28 DIAGNOSIS — L02419 Cutaneous abscess of limb, unspecified: Secondary | ICD-10-CM | POA: Diagnosis not present

## 2012-08-28 DIAGNOSIS — Z96649 Presence of unspecified artificial hip joint: Secondary | ICD-10-CM

## 2012-08-28 DIAGNOSIS — H8110 Benign paroxysmal vertigo, unspecified ear: Secondary | ICD-10-CM

## 2012-08-28 DIAGNOSIS — D62 Acute posthemorrhagic anemia: Secondary | ICD-10-CM | POA: Diagnosis not present

## 2012-08-28 DIAGNOSIS — IMO0002 Reserved for concepts with insufficient information to code with codable children: Principal | ICD-10-CM | POA: Diagnosis present

## 2012-08-28 DIAGNOSIS — E039 Hypothyroidism, unspecified: Secondary | ICD-10-CM

## 2012-08-28 DIAGNOSIS — F3130 Bipolar disorder, current episode depressed, mild or moderate severity, unspecified: Secondary | ICD-10-CM

## 2012-08-28 DIAGNOSIS — G4730 Sleep apnea, unspecified: Secondary | ICD-10-CM

## 2012-08-28 DIAGNOSIS — N390 Urinary tract infection, site not specified: Secondary | ICD-10-CM | POA: Diagnosis present

## 2012-08-28 DIAGNOSIS — E1142 Type 2 diabetes mellitus with diabetic polyneuropathy: Secondary | ICD-10-CM

## 2012-08-28 DIAGNOSIS — Z882 Allergy status to sulfonamides status: Secondary | ICD-10-CM

## 2012-08-28 DIAGNOSIS — F319 Bipolar disorder, unspecified: Secondary | ICD-10-CM | POA: Diagnosis present

## 2012-08-28 DIAGNOSIS — A4902 Methicillin resistant Staphylococcus aureus infection, unspecified site: Secondary | ICD-10-CM | POA: Diagnosis not present

## 2012-08-28 DIAGNOSIS — Y92009 Unspecified place in unspecified non-institutional (private) residence as the place of occurrence of the external cause: Secondary | ICD-10-CM

## 2012-08-28 DIAGNOSIS — X58XXXA Exposure to other specified factors, initial encounter: Secondary | ICD-10-CM | POA: Diagnosis present

## 2012-08-28 DIAGNOSIS — Y998 Other external cause status: Secondary | ICD-10-CM

## 2012-08-28 DIAGNOSIS — J309 Allergic rhinitis, unspecified: Secondary | ICD-10-CM

## 2012-08-28 DIAGNOSIS — J45909 Unspecified asthma, uncomplicated: Secondary | ICD-10-CM

## 2012-08-28 DIAGNOSIS — Z6372 Alcoholism and drug addiction in family: Secondary | ICD-10-CM

## 2012-08-28 DIAGNOSIS — Z90710 Acquired absence of both cervix and uterus: Secondary | ICD-10-CM

## 2012-08-28 DIAGNOSIS — K219 Gastro-esophageal reflux disease without esophagitis: Secondary | ICD-10-CM

## 2012-08-28 DIAGNOSIS — E119 Type 2 diabetes mellitus without complications: Secondary | ICD-10-CM

## 2012-08-28 DIAGNOSIS — R3915 Urgency of urination: Secondary | ICD-10-CM

## 2012-08-28 DIAGNOSIS — M25561 Pain in right knee: Secondary | ICD-10-CM

## 2012-08-28 DIAGNOSIS — Z6841 Body Mass Index (BMI) 40.0 and over, adult: Secondary | ICD-10-CM

## 2012-08-28 DIAGNOSIS — M1990 Unspecified osteoarthritis, unspecified site: Secondary | ICD-10-CM

## 2012-08-28 DIAGNOSIS — N3946 Mixed incontinence: Secondary | ICD-10-CM

## 2012-08-28 DIAGNOSIS — F3181 Bipolar II disorder: Secondary | ICD-10-CM | POA: Diagnosis present

## 2012-08-28 HISTORY — DX: Shortness of breath: R06.02

## 2012-08-28 LAB — CBC WITH DIFFERENTIAL/PLATELET
Basophils Absolute: 0 10*3/uL (ref 0.0–0.1)
Basophils Relative: 0 % (ref 0–1)
Eosinophils Absolute: 0.1 10*3/uL (ref 0.0–0.7)
Eosinophils Relative: 1 % (ref 0–5)
HCT: 29.9 % — ABNORMAL LOW (ref 36.0–46.0)
Hemoglobin: 9.7 g/dL — ABNORMAL LOW (ref 12.0–15.0)
Lymphocytes Relative: 18 % (ref 12–46)
Lymphs Abs: 1.6 10*3/uL (ref 0.7–4.0)
MCH: 26.7 pg (ref 26.0–34.0)
MCHC: 32.4 g/dL (ref 30.0–36.0)
MCV: 82.4 fL (ref 78.0–100.0)
Monocytes Absolute: 1.1 10*3/uL — ABNORMAL HIGH (ref 0.1–1.0)
Monocytes Relative: 12 % (ref 3–12)
Neutro Abs: 6.3 10*3/uL (ref 1.7–7.7)
Neutrophils Relative %: 70 % (ref 43–77)
Platelets: 387 10*3/uL (ref 150–400)
RBC: 3.63 MIL/uL — ABNORMAL LOW (ref 3.87–5.11)
RDW: 14.2 % (ref 11.5–15.5)
WBC: 9 10*3/uL (ref 4.0–10.5)

## 2012-08-28 MED ORDER — HYDROMORPHONE HCL PF 1 MG/ML IJ SOLN: 1.0000 mg | Freq: Once | INTRAMUSCULAR | Status: DC

## 2012-08-28 MED ORDER — METHOCARBAMOL 100 MG/ML IJ SOLN
1000.0000 mg | Freq: Once | INTRAVENOUS | Status: AC
  Administered 2012-08-28: 1000 mg via INTRAVENOUS
  Filled 2012-08-28: qty 10

## 2012-08-28 MED ORDER — LORAZEPAM 2 MG/ML IJ SOLN
1.0000 mg | Freq: Once | INTRAMUSCULAR | Status: AC
  Administered 2012-08-28: 1 mg via INTRAVENOUS
  Filled 2012-08-28: qty 1

## 2012-08-28 MED ORDER — METHOCARBAMOL 100 MG/ML IJ SOLN: 1000.0000 mg | Freq: Once | INTRAMUSCULAR | Status: DC

## 2012-08-28 MED ORDER — HYDROMORPHONE HCL PF 2 MG/ML IJ SOLN
2.0000 mg | Freq: Once | INTRAMUSCULAR | Status: DC
  Filled 2012-08-28: qty 1

## 2012-08-28 MED ORDER — MORPHINE SULFATE 2 MG/ML IJ SOLN
2.0000 mg | Freq: Once | INTRAMUSCULAR | Status: AC
  Administered 2012-08-28: 2 mg via INTRAVENOUS
  Filled 2012-08-28: qty 1

## 2012-08-28 NOTE — ED Provider Notes (Signed)
This patient was seen in conjunction with a resident.  On my exam the patient was in no distress.  The patient's course is well described by the resident.  This female with history of chronic pain, prior evaluation demonstrating meniscus injury, now presents with ongoing knee pain.  On exam there is little suspicion for acute systemic illness / dvt / septic joint.   Elyse Jarvis, MD 08/28/12 [phone]

## 2012-08-28 NOTE — ED Notes (Signed)
IV team paged.  

## 2012-08-28 NOTE — ED Provider Notes (Signed)
Medical screening examination/treatment/procedure(s) were conducted as a shared visit with non-physician practitioner(s) and myself.  I personally evaluated the patient during the encounter, Pt/family want evaluation for Obs/Admit for intractable chronic severe pain right leg/knee no relief from morphine from ortho at [facility] has appt with [facility] in [place] in 2 days but can no longer get out of bed to use bedside commode, was able to use walker until yesterday, entire right leg tender to LT with normal LT and DP intact won't move right leg other than mnimally to foot due to pain mostly to knee, will call unassigned med to consider Obs for intractable pain.  Hurman Horn, MD 08/30/12 [phone]

## 2012-08-28 NOTE — ED Notes (Signed)
Dr. Bednar at bedside. 

## 2012-08-28 NOTE — ED Notes (Signed)
Pt in via EMS, per EMS- pt in c/o right knee pain, pt was seen here for same last night, pt states she has a torn meniscus. Pt took 60 mg morphine PO approx 1 hour ago with no relief, pupils pen point for EMS, BP elevated.

## 2012-08-28 NOTE — ED Provider Notes (Signed)
History     CSN: [id number]  Arrival date & time 08/28/12  2224   First MD Initiated Contact with Patient 08/28/12 2226      Chief Complaint  Patient presents with  . Knee Pain    (Consider location/radiation/quality/duration/timing/severity/associated sxs/prior treatment) HPI Comments: Patient presents with complaint of right knee pain. The pain has been present for 4 weeks. She rates the pain 10/10 with radiation to her back and right leg. She states she took 60mg  of morphine without relief. Associated symptoms include difficulty ambulating.  Of note patient was seen in this ED yesterday with the same complaint. At that time the patient's family member presented the results of an MRI done on Saturday that were remarkable for a meniscal tear. Patient was also worked up for DVT, also negative. Patient was given pain control and discharged home. She has a follow-up appointment on Monday with Dr. Lavada Mesi of [facility]. Patient's primary care is Dr. Karie Georges. Denies fevers or chills. Denies NVD or abdominal pain.   The history is provided by the patient. No language interpreter was used.    Past Medical History  Diagnosis Date  . Anemia   . Osteoarthritis   . Asthma   . Diabetes mellitus   . Hypothyroid   . GERD (gastroesophageal reflux disease)   . Allergic rhinitis   . Hypertension   . Bipolar disorder     takes Synthroid meds for Bipolar    Past Surgical History  Procedure Date  . Total hip arthroplasty 2002    Left  . Partial hysterectomy 1982    for mennorhagia, no cervix  . Foot surgery 1982    bone spur  . Neck surgery     Herniated disk C2,3,4  . Tmj arthroplasty 1982  . Hospitalized     for bipolar  . Abdominal hysterectomy   . Joint replacement     2002  . Carpal tunnel release 2002    Family History  Problem Relation Age of Onset  . Aneurysm Father   . Alcohol abuse Father   . Heart attack      Siblings  . Coronary artery disease     Family history 1st degree relative <50/Family history 1st degree relative <60/Siblings  . Anesthesia problems Neg Hx   . Hypotension Neg Hx   . Malignant hyperthermia Neg Hx   . Pseudochol deficiency Neg Hx     History  Substance Use Topics  . Smoking status: Former Smoker -- 1.5 packs/day    Types: Cigarettes    Quit date: 04/29/2009  . Smokeless tobacco: Never Used  . Alcohol Use: Yes     Comment: very rare    OB History    Grav Para Term Preterm Abortions TAB SAB Ect Mult Living                  Review of Systems  Constitutional: Negative for fever and chills.  Gastrointestinal: Negative for nausea, vomiting, abdominal pain and diarrhea.  Musculoskeletal: Positive for back pain and arthralgias.  All other systems reviewed and are negative.    Allergies  Cephalexin and Sulfonamide derivatives  Home Medications   Current Outpatient Rx  Name  Route  Sig  Dispense  Refill  . ACETAMINOPHEN 500 MG PO TABS   Oral   Take 1,000 mg by mouth every 6 (six) hours as needed. For pain         . ALBUTEROL SULFATE HFA 108 (90 BASE) MCG/ACT IN AERS  Inhalation   Inhale 2 puffs into the lungs every 6 (six) hours as needed. For shortness of breath         . ASPIRIN 81 MG PO TBEC   Oral   Take 81 mg by mouth daily.           Marland Kitchen CALCIUM + D PO   Oral   Take 1 tablet by mouth daily.         . CYCLOBENZAPRINE HCL 10 MG PO TABS   Oral   Take 10 mg by mouth 3 (three) times daily as needed. For muscle pain         . DOCUSATE SODIUM 100 MG PO CAPS   Oral   Take 300 mg by mouth at bedtime.         . FUROSEMIDE 80 MG PO TABS   Oral   Take 40 mg by mouth 2 (two) times daily as needed. For fluid         . IBUPROFEN 800 MG PO TABS   Oral   Take 800 mg by mouth every 8 (eight) hours as needed.         . IRON PO   Oral   Take 75 mg by mouth daily.         Marland Kitchen LAMOTRIGINE 200 MG PO TABS   Oral   Take 200 mg by mouth daily.           Marland Kitchen LEVOTHYROXINE  SODIUM 150 MCG PO TABS   Oral   Take 150 mcg by mouth daily.         Marland Kitchen LIRAGLUTIDE 18 MG/3ML [place] SOLN   Subcutaneous   Inject 1.8 mg into the skin daily.         Marland Kitchen LOSARTAN POTASSIUM 50 MG PO TABS   Oral   Take 50 mg by mouth daily.         Marland Kitchen METFORMIN HCL ER 500 MG PO TB24   Oral   Take 500 mg by mouth 3 (three) times daily.          Marland Kitchen MONTELUKAST SODIUM 10 MG PO TABS   Oral   Take 10 mg by mouth at bedtime.         . MORPHINE SULFATE ER 15 MG PO TBCR   Oral   Take 30 mg by mouth 2 (two) times daily.          . CENTRUM SILVER PO   Oral   Take 1 tablet by mouth daily.         Marland Kitchen OXCARBAZEPINE 300 MG PO TABS   Oral   Take 300-600 mg by mouth 2 (two) times daily. 1 in the morning and 2 every evening         . OXYCODONE-ACETAMINOPHEN 10-325 MG PO TABS   Oral   Take 1 tablet by mouth every 6 (six) hours as needed. For pain         . PREDNISONE 10 MG PO TABS   Oral   Take 10 mg by mouth daily. Take for 5 days. Started medication on 08/25/12         . PROMETHAZINE HCL 25 MG PO TABS   Oral   Take 25 mg by mouth every 6 (six) hours as needed. For nausea         . SIMVASTATIN 40 MG PO TABS   Oral   Take 40 mg by mouth every evening.  BP 154/75  Pulse 87  Resp 24  SpO2 96%  Physical Exam  Nursing note and vitals reviewed. Constitutional: She appears well-developed and well-nourished. She appears distressed.  HENT:  Head: Normocephalic and atraumatic.  Mouth/Throat: Oropharynx is clear and moist.  Eyes: Conjunctivae normal and EOM are normal. No scleral icterus.  Neck: Normal range of motion. Neck supple.  Cardiovascular: Normal rate, regular rhythm and normal heart sounds.   Pulmonary/Chest: Effort normal and breath sounds normal.  Abdominal: Soft. Bowel sounds are normal. There is no tenderness.  Musculoskeletal: She exhibits edema and tenderness.       Right knee has impaired ROM due to pain. Mild edema, exquisite tenderness to  lateral and medial patella. Strong pedal pulse, good cap refill, sensation intact. No increased warmth or redness noted.   Neurological: She is alert.  Skin: Skin is warm and dry.    ED Course  Procedures (including critical care time)   Labs Reviewed  CBC WITH DIFFERENTIAL  BASIC METABOLIC PANEL   Results for orders placed during the hospital encounter of 08/28/12  CBC WITH DIFFERENTIAL      Component Value Range   WBC 9.0  4.0 - 10.5 K/uL   RBC 3.63 (*) 3.87 - 5.11 MIL/uL   Hemoglobin 9.7 (*) 12.0 - 15.0 g/dL   HCT 16.1 (*) 09.6 - 04.5 %   MCV 82.4  78.0 - 100.0 fL   MCH 26.7  26.0 - 34.0 pg   MCHC 32.4  30.0 - 36.0 g/dL   RDW 40.9  81.1 - 91.4 %   Platelets 387  150 - 400 K/uL   Neutrophils Relative 70  43 - 77 %   Neutro Abs 6.3  1.7 - 7.7 K/uL   Lymphocytes Relative 18  12 - 46 %   Lymphs Abs 1.6  0.7 - 4.0 K/uL   Monocytes Relative 12  3 - 12 %   Monocytes Absolute 1.1 (*) 0.1 - 1.0 K/uL   Eosinophils Relative 1  0 - 5 %   Eosinophils Absolute 0.1  0.0 - 0.7 K/uL   Basophils Relative 0  0 - 1 %   Basophils Absolute 0.0  0.0 - 0.1 K/uL  BASIC METABOLIC PANEL      Component Value Range   Sodium 135  135 - 145 mEq/L   Potassium 3.7  3.5 - 5.1 mEq/L   Chloride 97  96 - 112 mEq/L   CO2 28  19 - 32 mEq/L   Glucose, Bld 120 (*) 70 - 99 mg/dL   BUN 15  6 - 23 mg/dL   Creatinine, Ser 7.82  0.50 - 1.10 mg/dL   Calcium 9.1  8.4 - 95.6 mg/dL   GFR calc non Af Amer >90  >90 mL/min   GFR calc Af Amer >90  >90 mL/min    No results found.   1. Right knee pain   2. Right knee meniscal tear   3. Diabetes   4. Obesity   5. Anemia       MDM  Patient presented with complaint of knee pain. Given pain control with some improvement. Patient has know meniscal tear and is awaiting follow-up with ortho. Patient visit shared with Dr. Fonnie Jarvis who recommended calling for admission for pain control observation and possible ortho consult. CBC, BMP: remarkable for mild anemia and  hyperglycemia. Consulted triad. Dr. Eben Burow stated he will admit patient for unretractable pain. I will continue pain control as needed.  Pixie Casino, PA-C 08/29/12 [phone]

## 2012-08-29 ENCOUNTER — Encounter (HOSPITAL_COMMUNITY): Payer: Self-pay | Admitting: Orthopedic Surgery

## 2012-08-29 ENCOUNTER — Observation Stay (HOSPITAL_COMMUNITY): Payer: BC Managed Care – PPO

## 2012-08-29 DIAGNOSIS — M25569 Pain in unspecified knee: Secondary | ICD-10-CM

## 2012-08-29 DIAGNOSIS — M25561 Pain in right knee: Secondary | ICD-10-CM | POA: Diagnosis present

## 2012-08-29 LAB — BASIC METABOLIC PANEL
BUN: 15 mg/dL (ref 6–23)
CO2: 28 mEq/L (ref 19–32)
Calcium: 9.1 mg/dL (ref 8.4–10.5)
Chloride: 97 mEq/L (ref 96–112)
Creatinine, Ser: 0.64 mg/dL (ref 0.50–1.10)
GFR calc Af Amer: >90 mL/min (ref >90)
GFR calc non Af Amer: >90 mL/min (ref >90)
Glucose, Bld: 120 mg/dL — ABNORMAL HIGH (ref 70–99)
Potassium: 3.7 mEq/L (ref 3.5–5.1)
Sodium: 135 mEq/L (ref 135–145)

## 2012-08-29 LAB — GLUCOSE, CAPILLARY
Glucose-Capillary: 104 mg/dL — ABNORMAL HIGH (ref 70–99)
Glucose-Capillary: 98 mg/dL (ref 70–99)
Glucose-Capillary: 137 mg/dL — ABNORMAL HIGH (ref 70–99)
Glucose-Capillary: 85 mg/dL (ref 70–99)

## 2012-08-29 LAB — PROTIME-INR
INR: 1.23 (ref 0.00–1.49)
Prothrombin Time: 15.3 seconds — ABNORMAL HIGH (ref 11.6–15.2)

## 2012-08-29 LAB — SURGICAL PCR SCREEN
MRSA, PCR: POSITIVE — AB
Staphylococcus aureus: POSITIVE — AB

## 2012-08-29 MED ORDER — HYDROMORPHONE HCL PF 1 MG/ML IJ SOLN
1.0000 mg | Freq: Once | INTRAMUSCULAR | Status: DC
  Filled 2012-08-29: qty 1

## 2012-08-29 MED ORDER — MONTELUKAST SODIUM 10 MG PO TABS
10.0000 mg | ORAL_TABLET | Freq: Every day | ORAL | Status: DC
  Administered 2012-08-29 – 2012-09-10 (×13): 10 mg via ORAL
  Filled 2012-08-29 (×15): qty 1

## 2012-08-29 MED ORDER — HYDROMORPHONE HCL PF 1 MG/ML IJ SOLN
0.5000 mg | Freq: Once | INTRAMUSCULAR | Status: AC
  Administered 2012-08-29: 0.5 mg via INTRAVENOUS

## 2012-08-29 MED ORDER — SIMVASTATIN 40 MG PO TABS
40.0000 mg | ORAL_TABLET | Freq: Every evening | ORAL | Status: DC
  Administered 2012-08-29 – 2012-09-10 (×12): 40 mg via ORAL
  Filled 2012-08-29 (×14): qty 1

## 2012-08-29 MED ORDER — INSULIN ASPART 100 UNIT/ML ~~LOC~~ SOLN
0.0000 [IU] | Freq: Three times a day (TID) | SUBCUTANEOUS | Status: DC
  Administered 2012-08-29 – 2012-09-04 (×6): 2 [IU] via SUBCUTANEOUS
  Administered 2012-09-06: 1 [IU] via SUBCUTANEOUS
  Administered 2012-09-07 – 2012-09-10 (×5): 2 [IU] via SUBCUTANEOUS
  Administered 2012-09-11: 3 [IU] via SUBCUTANEOUS

## 2012-08-29 MED ORDER — LIRAGLUTIDE 18 MG/3ML ~~LOC~~ SOLN
1.8000 mg | Freq: Every day | SUBCUTANEOUS | Status: DC
  Administered 2012-08-29 – 2012-08-30 (×2): 1.8 mg via SUBCUTANEOUS

## 2012-08-29 MED ORDER — MUPIROCIN 2 % EX OINT
1.0000 "application " | TOPICAL_OINTMENT | Freq: Two times a day (BID) | CUTANEOUS | Status: AC
  Administered 2012-08-29 – 2012-09-03 (×9): 1 via NASAL
  Filled 2012-08-29: qty 22

## 2012-08-29 MED ORDER — OXCARBAZEPINE 300 MG PO TABS
600.0000 mg | ORAL_TABLET | Freq: Every day | ORAL | Status: DC
  Administered 2012-08-29 – 2012-09-10 (×13): 600 mg via ORAL
  Filled 2012-08-29 (×16): qty 2

## 2012-08-29 MED ORDER — SODIUM CHLORIDE 0.9 % IJ SOLN: 3.0000 mL | INTRAMUSCULAR | Status: DC | PRN

## 2012-08-29 MED ORDER — KETOROLAC TROMETHAMINE 30 MG/ML IJ SOLN
30.0000 mg | Freq: Four times a day (QID) | INTRAMUSCULAR | Status: AC
  Administered 2012-08-29 – 2012-09-02 (×19): 30 mg via INTRAVENOUS
  Filled 2012-08-29 (×22): qty 1

## 2012-08-29 MED ORDER — DOCUSATE SODIUM 100 MG PO CAPS
300.0000 mg | ORAL_CAPSULE | Freq: Every day | ORAL | Status: DC
  Administered 2012-08-29 – 2012-09-10 (×13): 300 mg via ORAL
  Filled 2012-08-29 (×14): qty 3
  Filled 2012-08-29: qty 1
  Filled 2012-08-29 (×2): qty 2

## 2012-08-29 MED ORDER — INSULIN ASPART 100 UNIT/ML ~~LOC~~ SOLN: 0.0000 [IU] | Freq: Every day | SUBCUTANEOUS | Status: DC

## 2012-08-29 MED ORDER — SODIUM CHLORIDE 0.9 % IV SOLN
250.0000 mL | INTRAVENOUS | Status: DC | PRN
  Administered 2012-09-11: 1000 mL via INTRAVENOUS

## 2012-08-29 MED ORDER — LAMOTRIGINE 200 MG PO TABS
200.0000 mg | ORAL_TABLET | Freq: Every day | ORAL | Status: DC
  Administered 2012-08-29 – 2012-09-11 (×13): 200 mg via ORAL
  Filled 2012-08-29 (×14): qty 1

## 2012-08-29 MED ORDER — ASPIRIN EC 81 MG PO TBEC
81.0000 mg | DELAYED_RELEASE_TABLET | Freq: Every day | ORAL | Status: DC
  Administered 2012-08-29 – 2012-08-31 (×3): 81 mg via ORAL
  Filled 2012-08-29 (×4): qty 1

## 2012-08-29 MED ORDER — OXCARBAZEPINE 300 MG PO TABS
300.0000 mg | ORAL_TABLET | Freq: Every day | ORAL | Status: DC
  Administered 2012-08-29 – 2012-09-11 (×13): 300 mg via ORAL
  Filled 2012-08-29 (×14): qty 1

## 2012-08-29 MED ORDER — MORPHINE SULFATE 2 MG/ML IJ SOLN
2.0000 mg | Freq: Once | INTRAMUSCULAR | Status: AC
  Administered 2012-08-29: 2 mg via INTRAVENOUS
  Filled 2012-08-29: qty 1

## 2012-08-29 MED ORDER — HYDROMORPHONE HCL PF 1 MG/ML IJ SOLN
1.0000 mg | INTRAMUSCULAR | Status: DC | PRN
  Administered 2012-08-29 – 2012-09-01 (×22): 1 mg via INTRAVENOUS
  Filled 2012-08-29 (×25): qty 1

## 2012-08-29 MED ORDER — METHOCARBAMOL 500 MG PO TABS
500.0000 mg | ORAL_TABLET | Freq: Three times a day (TID) | ORAL | Status: DC | PRN
  Administered 2012-08-29 – 2012-09-02 (×8): 500 mg via ORAL
  Filled 2012-08-29 (×9): qty 1

## 2012-08-29 MED ORDER — CHLORHEXIDINE GLUCONATE CLOTH 2 % EX PADS
6.0000 | MEDICATED_PAD | Freq: Every day | CUTANEOUS | Status: AC
  Administered 2012-08-30 – 2012-09-03 (×2): 6 via TOPICAL

## 2012-08-29 MED ORDER — SODIUM CHLORIDE 0.9 % IJ SOLN
3.0000 mL | Freq: Two times a day (BID) | INTRAMUSCULAR | Status: DC
  Administered 2012-08-29 – 2012-09-10 (×9): 3 mL via INTRAVENOUS

## 2012-08-29 MED ORDER — INFLUENZA VIRUS VACC SPLIT PF IM SUSP
0.5000 mL | INTRAMUSCULAR | Status: AC
  Administered 2012-08-30: 0.5 mL via INTRAMUSCULAR
  Filled 2012-08-29: qty 0.5

## 2012-08-29 MED ORDER — LOSARTAN POTASSIUM 50 MG PO TABS
50.0000 mg | ORAL_TABLET | Freq: Every day | ORAL | Status: DC
  Administered 2012-08-29 – 2012-09-05 (×7): 50 mg via ORAL
  Filled 2012-08-29 (×8): qty 1

## 2012-08-29 MED ORDER — LORAZEPAM 2 MG/ML IJ SOLN
0.5000 mg | Freq: Once | INTRAMUSCULAR | Status: AC
  Administered 2012-09-09: 0.5 mg via INTRAVENOUS
  Filled 2012-08-29: qty 1

## 2012-08-29 MED ORDER — ALBUTEROL SULFATE HFA 108 (90 BASE) MCG/ACT IN AERS: 2.0000 | INHALATION_SPRAY | Freq: Four times a day (QID) | RESPIRATORY_TRACT | Status: DC | PRN

## 2012-08-29 MED ORDER — PNEUMOCOCCAL VAC POLYVALENT 25 MCG/0.5ML IJ INJ
0.5000 mL | INJECTION | INTRAMUSCULAR | Status: AC
  Administered 2012-08-30: 0.5 mL via INTRAMUSCULAR
  Filled 2012-08-29: qty 0.5

## 2012-08-29 MED ORDER — INSULIN ASPART 100 UNIT/ML ~~LOC~~ SOLN
4.0000 [IU] | Freq: Three times a day (TID) | SUBCUTANEOUS | Status: DC
  Administered 2012-08-29 – 2012-09-10 (×11): 4 [IU] via SUBCUTANEOUS

## 2012-08-29 MED ORDER — ENOXAPARIN SODIUM 40 MG/0.4ML ~~LOC~~ SOLN
40.0000 mg | Freq: Every day | SUBCUTANEOUS | Status: DC
  Administered 2012-08-29: 40 mg via SUBCUTANEOUS
  Filled 2012-08-29: qty 0.4

## 2012-08-29 MED ORDER — ENOXAPARIN SODIUM 80 MG/0.8ML ~~LOC~~ SOLN
80.0000 mg | Freq: Every day | SUBCUTANEOUS | Status: DC
  Administered 2012-08-30 – 2012-09-05 (×6): 80 mg via SUBCUTANEOUS
  Filled 2012-08-29 (×7): qty 0.8

## 2012-08-29 MED ORDER — LEVOTHYROXINE SODIUM 150 MCG PO TABS
150.0000 ug | ORAL_TABLET | Freq: Every day | ORAL | Status: DC
  Administered 2012-08-29 – 2012-09-11 (×13): 150 ug via ORAL
  Filled 2012-08-29 (×15): qty 1

## 2012-08-29 NOTE — H&P (Signed)
[facility] AD[facility]ION NOTE  Date: 08/29/2012  Patient name:  Amy Barnes  Medical record number:  [id number] Date of birth:  1951/03/15  Age: 61 y.o. Gender:  female PCP:    [name], MD  Medical Service:  Internal Medicine    Chief Complaint: right knee pain  History of Present Illness: Patient is a 61 y.o. female with a PMHx of morbid obesity, diabetes type 2, hypothyroidism, bipolar disorder and multiple other medical problems had right meniscal tear 2 weeks ago while she was visiting [place](patient has copy of MRI with her). Patient has had intractable right knee pain ever since. Pain has been described as 10 out of 10. No radiation. Aggravated minimal touch or movement. Decreased by narcotic pain medications. Patient has a followup appointment with an orthopedic on Monday, December 2 but was unable to tolerate the pain.  Patient was seen in the ER yesterday for similar complaints but started having pain again this morning. ED physician requested for admission for pain control and for possible inpatient orthopedic referral.  Current Outpatient Medications: Current Facility-Administered Medications  Medication Dose Route Frequency Provider Last Rate Last Dose  . [COMPLETED] LORazepam (ATIVAN) injection 1 mg  1 mg Intravenous Once Tia Oliveri, PA-C   1 mg at 08/28/12 2333  . [COMPLETED] methocarbamol (ROBAXIN) 1,000 mg in dextrose 5 % 50 mL IVPB  1,000 mg Intravenous Once Carleene Cooper III, MD   1,000 mg at 08/28/12 2359  . [COMPLETED] morphine 2 MG/ML injection 2 mg  2 mg Intravenous Once Tia Oliveri, PA-C   2 mg at 08/28/12 2331  . morphine 2 MG/ML injection 2 mg  2 mg Intravenous Once Tia Oliveri, PA-C      . [DISCONTINUED] HYDROmorphone (DILAUDID) injection 1 mg  1 mg Intravenous Once Tia Oliveri, PA-C      . [DISCONTINUED] HYDROmorphone (DILAUDID) injection 2 mg  2 mg Intramuscular Once Tia Oliveri, PA-C      . [DISCONTINUED] methocarbamol (ROBAXIN) injection 1,000 mg   1,000 mg Intravenous Once Pixie Casino, PA-C       Current Outpatient Prescriptions  Medication Sig Dispense Refill  . acetaminophen (TYLENOL) 500 MG tablet Take 1,000 mg by mouth every 6 (six) hours as needed. For pain      . albuterol (PROVENTIL HFA;VENTOLIN HFA) 108 (90 BASE) MCG/ACT inhaler Inhale 2 puffs into the lungs every 6 (six) hours as needed. For shortness of breath      . aspirin 81 MG EC tablet Take 81 mg by mouth daily.        . Calcium Carbonate-Vitamin D (CALCIUM + D PO) Take 1 tablet by mouth daily.      . cyclobenzaprine (FLEXERIL) 10 MG tablet Take 10 mg by mouth 3 (three) times daily as needed. For muscle pain      . docusate sodium (COLACE) 100 MG capsule Take 300 mg by mouth at bedtime.      . furosemide (LASIX) 80 MG tablet Take 40 mg by mouth 2 (two) times daily as needed. For fluid      . ibuprofen (ADVIL,MOTRIN) 800 MG tablet Take 800 mg by mouth every 8 (eight) hours as needed.      . IRON PO Take 75 mg by mouth daily.      Marland Kitchen lamoTRIgine (LAMICTAL) 200 MG tablet Take 200 mg by mouth daily.        Marland Kitchen levothyroxine (SYNTHROID, LEVOTHROID) 150 MCG tablet Take 150 mcg by mouth daily.      Marland Kitchen  Liraglutide 18 MG/3ML SOLN Inject 1.8 mg into the skin daily.      Marland Kitchen losartan (COZAAR) 50 MG tablet Take 50 mg by mouth daily.      . metFORMIN (GLUCOPHAGE-XR) 500 MG 24 hr tablet Take 500 mg by mouth 3 (three) times daily.       . montelukast (SINGULAIR) 10 MG tablet Take 10 mg by mouth at bedtime.      Marland Kitchen morphine (MS CONTIN) 15 MG 12 hr tablet Take 30 mg by mouth 2 (two) times daily.       . Multiple Vitamins-Minerals (CENTRUM SILVER PO) Take 1 tablet by mouth daily.      . Oxcarbazepine (TRILEPTAL) 300 MG tablet Take 300-600 mg by mouth 2 (two) times daily. 1 in the morning and 2 every evening      . oxyCODONE-acetaminophen (PERCOCET) 10-325 MG per tablet Take 1 tablet by mouth every 6 (six) hours as needed. For pain      . predniSONE (DELTASONE) 10 MG tablet Take 10 mg by mouth  daily. Take for 5 days. Started medication on 08/25/12      . promethazine (PHENERGAN) 25 MG tablet Take 25 mg by mouth every 6 (six) hours as needed. For nausea      . simvastatin (ZOCOR) 40 MG tablet Take 40 mg by mouth every evening.        Allergies: Cephalexin and Sulfonamide derivatives  Past Medical History: Past Medical History  Diagnosis Date  . Anemia   . Osteoarthritis   . Asthma   . Diabetes mellitus   . Hypothyroid   . GERD (gastroesophageal reflux disease)   . Allergic rhinitis   . Hypertension   . Bipolar disorder     takes Synthroid meds for Bipolar    Past Surgical History: Past Surgical History  Procedure Date  . Total hip arthroplasty 2002    Left  . Partial hysterectomy 1982    for mennorhagia, no cervix  . Foot surgery 1982    bone spur  . Neck surgery     Herniated disk C2,3,4  . Tmj arthroplasty 1982  . Hospitalized     for bipolar  . Abdominal hysterectomy   . Joint replacement     2002  . Carpal tunnel release 2002    Family History: Family History  Problem Relation Age of Onset  . Aneurysm Father   . Alcohol abuse Father   . Heart attack      Siblings  . Coronary artery disease      Family history 1st degree relative <50/Family history 1st degree relative <60/Siblings  . Anesthesia problems Neg Hx   . Hypotension Neg Hx   . Malignant hyperthermia Neg Hx   . Pseudochol deficiency Neg Hx     Social History: History   Social History  . Marital Status: Married    Spouse Name: N/A    Number of Children: 3  . Years of Education: N/A   Occupational History  . Unemployed    Social History Main Topics  . Smoking status: Former Smoker -- 1.5 packs/day    Types: Cigarettes    Quit date: 04/29/2009  . Smokeless tobacco: Never Used  . Alcohol Use: Yes     Comment: very rare  . Drug Use: No  . Sexually Active: No   Other Topics Concern  . 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  Vital Signs: T: 97.9 P: 87 BP: 154/75 RR: 24 O2 sat: 96% on RA   Physical Exam: General: Vital signs reviewed and noted. Morbidly obese [place] female who appears about her age ,in acute distress due to pain; alert, appropriate and cooperative throughout examination.  Head: Normocephalic, atraumatic.  Eyes: PERRL, EOMI, No signs of anemia or jaundince.  Ears: TM nonerythematous, not bulging, good light reflex bilaterally.  Nose: Mucous membranes moist, not inflammed, nonerythematous.  Throat: Oropharynx nonerythematous, no exudate appreciated.   Neck: No deformities, masses, or tenderness noted.Supple, No carotid Bruits, no JVD.  Lungs:  Normal respiratory effort. Clear to auscultation BL without crackles or wheezes.  Heart: RRR. S1 and S2 normal without gallop, murmur, or rubs.  Abdomen:  BS normoactive. Soft, Nondistended, non-tender.  No masses or organomegaly.  Extremities: Right knee is extremely tender to palpation, restricted ROM from pain. No increased warmth or redness noted. Difficult to distinguish size of right knee from left knee as patient is morbidly obese   Neurologic: A&O X3, CN II - XII are grossly intact. Motor strength is 5/5 in the all 4 extremities, Sensations intact to light touch, Cerebellar signs negative.  Skin: No visible rashes, scars.   Lab results: CBC:    Component Value Date/Time   WBC 9.0 08/28/2012 2336   HGB 9.7* 08/28/2012 2336   HCT 29.9* 08/28/2012 2336   PLT 387 08/28/2012 2336   MCV 82.4 08/28/2012 2336   NEUTROABS 6.3 08/28/2012 2336   LYMPHSABS 1.6 08/28/2012 2336   MONOABS 1.1* 08/28/2012 2336   EOSABS 0.1 08/28/2012 2336   BASOSABS 0.0 08/28/2012 2336      Comprehensive Metabolic Panel:    Component Value Date/Time   NA 135 08/28/2012 2336   K 3.7 08/28/2012 2336   CL 97 08/28/2012 2336   CO2 28 08/28/2012 2336   BUN 15  08/28/2012 2336   CREATININE 0.64 08/28/2012 2336   GLUCOSE 120* 08/28/2012 2336   CALCIUM 9.1 08/28/2012 2336   AST 20 02/11/2012 0907   ALT 13 02/11/2012 0907   ALKPHOS 62 02/11/2012 0907   BILITOT 0.3 02/11/2012 0907   PROT 7.4 02/11/2012 0907   ALBUMIN 4.1 02/11/2012 0907     No results found for this basename: CKTOTAL, CKMB, CKMBINDEX, TROPONINI      Imaging results:  MRI right knee Patient has episodes with her which suggests meniscal tear. Please read the MRI report for further details.  Assessment & Plan:  1. right meniscal tear 2. bipolar disorder 3 diabetes mellitus type 2 4 hypertension 5 anemia 6 asthma 7 reflux disease 8 morbid obesity  Patient is a 61 year old female with two-week history of right knee pain from meniscal tear. Patient is being admitted today for control of her intractable knee pain. I will start her on 60 mg of IV ketorolac every 6 hours to reduce swelling and inflammation. I will also start her on Dilaudid 1 mg IV every 2 hours as needed for supplemental pain control. Sliding scale insulin for diabetes and restart all her other home medications. They may consider consulting orthopedics for management of meniscal tear.   DVT PPX: Lovenox     Lars Mage, M.D Pager: [phone] 08/29/2012 1:26 AM

## 2012-08-29 NOTE — Progress Notes (Signed)
[facility]                                                                                Patient Demographics  Amy Barnes, is a 61 y.o. female  HQI:[id number]  XLK:[id number]  DOB - 1951-08-29  Admit date - 08/28/2012  Admitting Physician Lars Mage, MD  Outpatient Primary MD for the patient is [name], MD  LOS - 1   Chief Complaint  Patient presents with  . Knee Pain        Assessment & Plan    1. Right knee pain, unable to bear weight on the right leg, in a patient with morbid obesity and MRI evidence of both lateral and medial meniscal tear along with joint effusion - also has been consulted, continue pain control, further recommendations per orthopedics, we'll commence PT OT once cleared by orthopedics to bear weight and stand, for now patient is unable to stand up or bear weight at all on the right leg, may need surgical intervention, in my opinion she should meet criteria for inpatient admission at this time.     2. Diabetes mellitus type 2 morbid obesity - continue pre-meal NovoLog along with sliding scale insulin, will monitor sugars if needed long-acting insulin will be added.   CBG (last 3)   Basename 08/29/12 0708  GLUCAP 104*     3. History of hypertension. Continue ARB, monitor blood pressure.    4. Hypothyroidism. Continue home dose Synthroid.    5. Dyslipidemia continue home dose statin.    6. Morbid obesity. Outpatient follow with PCP and dietician.     Code Status: full  Family Communication: patient and husband  Disposition Plan: TBD    Procedures     Consults  Orthopedics   Time Spent in minutes   45   Antibiotics     Anti-infectives    None      Scheduled Meds:   . aspirin EC  81 mg Oral Daily  . docusate sodium  300 mg Oral QHS  . enoxaparin (LOVENOX) injection  40 mg Subcutaneous Daily  .  HYDROmorphone (DILAUDID) injection  1 mg Intravenous Once  . influenza  inactive virus vaccine   0.5 mL Intramuscular Tomorrow-1000  . insulin aspart  0-15 Units Subcutaneous TID WC  . insulin aspart  0-5 Units Subcutaneous QHS  . insulin aspart  4 Units Subcutaneous TID WC  . ketorolac  30 mg Intravenous Q6H  . lamoTRIgine  200 mg Oral Daily  . levothyroxine  150 mcg Oral QAC breakfast  . Liraglutide  1.8 mg Subcutaneous QAC breakfast  . [COMPLETED] LORazepam  1 mg Intravenous Once  . losartan  50 mg Oral Daily  . [COMPLETED] methocarbamol (ROBAXIN) IV  1,000 mg Intravenous Once  . montelukast  10 mg Oral QHS  . [COMPLETED]  morphine injection  2 mg Intravenous Once  . [COMPLETED]  morphine injection  2 mg Intravenous Once  . Oxcarbazepine  300 mg Oral Daily  . OXcarbazepine  600 mg Oral QHS  . pneumococcal 23 valent vaccine  0.5 mL Intramuscular Tomorrow-1000  . simvastatin  40 mg Oral QPM  . sodium chloride  3 mL  Intravenous Q12H  . [DISCONTINUED]  HYDROmorphone (DILAUDID) injection  1 mg Intravenous Once  . [DISCONTINUED]  HYDROmorphone (DILAUDID) injection  2 mg Intramuscular Once  . [DISCONTINUED] methocarbamol  1,000 mg Intravenous Once   Continuous Infusions:  PRN Meds:.sodium chloride, albuterol, HYDROmorphone (DILAUDID) injection, methocarbamol, sodium chloride   DVT Prophylaxis  Lovenox -  SCDs    Lab Results  Component Value Date   PLT 387 08/28/2012      Susa Raring K M.D on 08/29/2012 at 11:32 AM  Between 7am to 7pm - Pager - [phone]  After 7pm go to [email or web address] - password [email or web address]  And look for the night coverage person covering for me after hours  [facility] Office  [phone]    Subjective:   Iylah Ghuman today has, No headache, No chest pain, No abdominal pain - No Nausea, No new weakness tingling or numbness, No Cough - SOB. + rt knee pain.  Objective:   Filed Vitals:   08/29/12 0240 08/29/12 0305 08/29/12 0500 08/29/12 0621  BP: 159/70 148/66  155/68  Pulse: 93 89  84  Temp:  98.1 F (36.7 C)  98.6 F (37 C)    Resp: 22 20  20   Height:  5\' 6"  (1.676 m) 5\' 6"  (1.676 m)   Weight:  152.3 kg (335 lb 12.2 oz) 152.3 kg (335 lb 12.2 oz)   SpO2: 95% 95% 95% 96%    Wt Readings from Last 3 Encounters:  08/29/12 152.3 kg (335 lb 12.2 oz)  07/28/12 155.47 kg (342 lb 12 oz)  05/21/12 153.769 kg (339 lb)     Intake/Output Summary (Last 24 hours) at 08/29/12 1132 Last data filed at 08/29/12 0600  Gross per 24 hour  Intake    180 ml  Output    500 ml  Net   -320 ml    Exam Awake Alert, Oriented X 3, No new F.N deficits, Normal affect [place].AT,PERRAL Supple Neck,No JVD, No cervical lymphadenopathy appriciated.  Symmetrical Chest wall movement, Good air movement bilaterally, CTAB RRR,No Gallops,Rubs or new Murmurs, No Parasternal Heave +ve B.Sounds, Abd Soft, Non tender, No organomegaly appriciated, No rebound - guarding or rigidity. No Cyanosis, Clubbing or edema, No new Rash or bruise   R knee swollen, tender to palpate, no warmth or erythema   Data Review   Micro Results No results found for this or any previous visit (from the past 240 hour(s)).  Radiology Reports Dg Knee Right Port  08/29/2012  *RADIOLOGY REPORT*  Clinical Data: Severe right knee pain.  PORTABLE RIGHT KNEE - 1-2 VIEW  Comparison: None.  Findings: There is marked narrowing of the medial compartment with small marginal osteophytes. Slight sclerosis of the medial compartment.  Lateral and patellofemoral compartments appear essentially normal.  No appreciable joint effusion.  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   Original Report Authenticated By: Francene Boyers, M.D.     CBC  Lab 08/28/12 2336  WBC 9.0  HGB 9.7*  HCT 29.9*  PLT 387  MCV 82.4  MCH 26.7  MCHC 32.4  RDW 14.2  LYMPHSABS 1.6  MONOABS 1.1*  EOSABS 0.1  BASOSABS 0.0  BANDABS --    Chemistries   Lab 08/28/12 2336  NA 135  K 3.7  CL 97  CO2 28  GLUCOSE 120*  BUN 15  CREATININE 0.64  CALCIUM 9.1  MG --   AST --  ALT --  ALKPHOS --  BILITOT --   ------------------------------------------------------------------------------------------------------------------ estimated creatinine  clearance is 113.9 ml/min (by C-G formula based on Cr of 0.64). ------------------------------------------------------------------------------------------------------------------ No results found for this basename: HGBA1C:2 in the last 72 hours ------------------------------------------------------------------------------------------------------------------ No results found for this basename: CHOL:2,HDL:2,LDLCALC:2,TRIG:2,CHOLHDL:2,LDLDIRECT:2 in the last 72 hours ------------------------------------------------------------------------------------------------------------------ No results found for this basename: TSH,T4TOTAL,FREET3,T3FREE,THYROIDAB in the last 72 hours ------------------------------------------------------------------------------------------------------------------ No results found for this basename: VITAMINB12:2,FOLATE:2,FERRITIN:2,TIBC:2,IRON:2,RETICCTPCT:2 in the last 72 hours  Coagulation profile No results found for this basename: INR:5,PROTIME:5 in the last 168 hours  No results found for this basename: DDIMER:2 in the last 72 hours  Cardiac Enzymes No results found for this basename: CK:3,CKMB:3,TROPONINI:3,MYOGLOBIN:3 in the last 168 hours ------------------------------------------------------------------------------------------------------------------ No components found with this basename: POCBNP:3

## 2012-08-29 NOTE — Progress Notes (Signed)
When pt had to be moved in the bed, she starting screaming in pain. Toradol was not able to relieve her pain. Pt continued to scream hysterically asking to die. Benedetto Coons notified and Dilaudid 0.5mg  was given with noticeable relief of pain. Pt refused to take her po medications, refused to allow vital signs to be taken, refused to take po fluids. Her husband has been unable to get her to cooperate. At present she is breathing easily and appears to be resting comfortably.

## 2012-08-29 NOTE — Consult Note (Signed)
Amy Barnes MRN:  [id number] DOB/SEX:  1950-12-24/female  CHIEF COMPLAINT:  Painful right Knee  HISTORY: Patient is a 61 y.o. female presented with a history of pain in the right knee. Onset of symptoms was gradual starting a few weeks ago with rapidly worsening course since that time. The patient noted no past surgery on the left knee. Prior procedures on the knee include none. Patient has been treated conservatively with over-the-counter NSAIDs and activity modification. Patient currently rates pain in the knee at 10 out of 10 with activity. There is pain at night. Patient admitted for pain control.  PAST MEDICAL HISTORY: Patient Active Problem List   Diagnosis Date Noted  . Right anterior knee pain 08/29/2012  . Urinary urgency 07/28/2012  . Leg pain, anterior 10/10/2011  . Obesity, morbid (more than 100 lbs over ideal weight or BMI > 40) 05/13/2011  . HYPERTENSION 05/17/2010  . HIP PAIN 12/25/2009  . CERVICAL RADICULOPATHY, RIGHT 12/25/2009  . DIABETIC PERIPHERAL NEUROPATHY 09/19/2008  . BENIGN POSITIONAL VERTIGO 07/21/2008  . URINARY INCONTINENCE, MIXED 12/14/2007  . HYPERCHOLESTEROLEMIA 08/16/2007  . HYPOTHYROIDISM 07/30/2007  . DM type 2 with diabetic peripheral neuropathy 07/30/2007  . ANEMIA-NOS 07/30/2007  . BIPOLAR AFFECTIVE DISORDER, DEPRESSED 07/30/2007  . ALLERGIC RHINITIS 07/30/2007  . ASTHMA 07/30/2007  . GERD 07/30/2007  . OSTEOARTHRITIS 07/30/2007  . SLEEP APNEA 07/30/2007   Past Medical History  Diagnosis Date  . Anemia   . Osteoarthritis   . Asthma   . Diabetes mellitus   . Hypothyroid   . GERD (gastroesophageal reflux disease)   . Allergic rhinitis   . Hypertension   . Bipolar disorder     takes Synthroid meds for Bipolar   Past Surgical History  Procedure Date  . Total hip arthroplasty 2002    Left  . Partial hysterectomy 1982    for mennorhagia, no cervix  . Foot surgery 1982    bone spur  . Neck surgery     Herniated disk C2,3,4  . Tmj  arthroplasty 1982  . Hospitalized     for bipolar  . Abdominal hysterectomy   . Joint replacement     2002  . Carpal tunnel release 2002     MEDICATIONS:   Prescriptions prior to admission  Medication Sig Dispense Refill  . acetaminophen (TYLENOL) 500 MG tablet Take 1,000 mg by mouth every 6 (six) hours as needed. For pain      . albuterol (PROVENTIL HFA;VENTOLIN HFA) 108 (90 BASE) MCG/ACT inhaler Inhale 2 puffs into the lungs every 6 (six) hours as needed. For shortness of breath      . aspirin 81 MG EC tablet Take 81 mg by mouth daily.        . Calcium Carbonate-Vitamin D (CALCIUM + D PO) Take 1 tablet by mouth daily.      . cyclobenzaprine (FLEXERIL) 10 MG tablet Take 10 mg by mouth 3 (three) times daily as needed. For muscle pain      . docusate sodium (COLACE) 100 MG capsule Take 300 mg by mouth at bedtime.      . furosemide (LASIX) 80 MG tablet Take 40 mg by mouth 2 (two) times daily as needed. For fluid      . ibuprofen (ADVIL,MOTRIN) 800 MG tablet Take 800 mg by mouth every 8 (eight) hours as needed.      . IRON PO Take 75 mg by mouth daily.      Marland Kitchen lamoTRIgine (LAMICTAL) 200 MG tablet Take 200  mg by mouth daily.        Marland Kitchen levothyroxine (SYNTHROID, LEVOTHROID) 150 MCG tablet Take 150 mcg by mouth daily.      . Liraglutide 18 MG/3ML SOLN Inject 1.8 mg into the skin daily.      Marland Kitchen losartan (COZAAR) 50 MG tablet Take 50 mg by mouth daily.      . metFORMIN (GLUCOPHAGE-XR) 500 MG 24 hr tablet Take 500 mg by mouth 3 (three) times daily.       . montelukast (SINGULAIR) 10 MG tablet Take 10 mg by mouth at bedtime.      Marland Kitchen morphine (MS CONTIN) 15 MG 12 hr tablet Take 30 mg by mouth 2 (two) times daily.       . Multiple Vitamins-Minerals (CENTRUM SILVER PO) Take 1 tablet by mouth daily.      . Oxcarbazepine (TRILEPTAL) 300 MG tablet Take 300-600 mg by mouth 2 (two) times daily. 1 in the morning and 2 every evening      . oxyCODONE-acetaminophen (PERCOCET) 10-325 MG per tablet Take 1 tablet by  mouth every 6 (six) hours as needed. For pain      . predniSONE (DELTASONE) 10 MG tablet Take 10 mg by mouth daily. Take for 5 days. Started medication on 08/25/12      . promethazine (PHENERGAN) 25 MG tablet Take 25 mg by mouth every 6 (six) hours as needed. For nausea      . simvastatin (ZOCOR) 40 MG tablet Take 40 mg by mouth every evening.        ALLERGIES:   Allergies  Allergen Reactions  . Cephalexin Hives  . Sulfonamide Derivatives     REACTION: Rash    REVIEW OF SYSTEMS:  Pertinent items are noted in HPI.   FAMILY HISTORY:   Family History  Problem Relation Age of Onset  . Aneurysm Father   . Alcohol abuse Father   . Heart attack      Siblings  . Coronary artery disease      Family history 1st degree relative <50/Family history 1st degree relative <60/Siblings  . Anesthesia problems Neg Hx   . Hypotension Neg Hx   . Malignant hyperthermia Neg Hx   . Pseudochol deficiency Neg Hx     SOCIAL HISTORY:   History  Substance Use Topics  . Smoking status: Former Smoker -- 1.5 packs/day    Types: Cigarettes    Quit date: 04/29/2009  . Smokeless tobacco: Never Used  . Alcohol Use: Yes     Comment: very rare     EXAMINATION:  Vital signs in last 24 hours: Temp:  [98.1 F (36.7 C)-98.6 F (37 C)] 98.6 F (37 C) (12/01 0621) Pulse Rate:  [84-93] 84  (12/01 0621) Resp:  [20-24] 20  (12/01 0621) BP: (148-159)/(66-75) 155/68 mmHg (12/01 0621) SpO2:  [95 %-96 %] 96 % (12/01 0621) Weight:  [152.3 kg (335 lb 12.2 oz)] 152.3 kg (335 lb 12.2 oz) (12/01 0500)  General:  Vital signs reviewed and noted. Morbidly obese [name] female who appears about her age ,in acute distress due to pain; alert, appropriate and cooperative throughout examination.   Head:  Normocephalic, atraumatic.   Eyes:  PERRL, EOMI, No signs of anemia or jaundince.   Ears:  TM nonerythematous, not bulging, good light reflex bilaterally.   Nose:  Mucous membranes moist, not inflammed, nonerythematous.     Throat:  Oropharynx nonerythematous, no exudate appreciated.   Neck:  No deformities, masses, or tenderness noted.Supple, No carotid  Bruits, no JVD.   Lungs:  Normal respiratory effort. Clear to auscultation BL without crackles or wheezes.   Heart:  RRR. S1 and S2 normal without gallop, murmur, or rubs.   Abdomen:  BS normoactive. Soft, Nondistended, non-tender. No masses or organomegaly.   Extremities:  Right knee is extremely tender to palpation, restricted ROM from pain. No increased warmth or redness noted. Difficult to distinguish size of right knee from left knee as patient is morbidly obese   Neurologic:  A&O X3, CN II - XII are grossly intact. Motor strength is 5/5 in the all 4 extremities, Sensations intact to light touch, Cerebellar signs negative.   Skin:  No visible rashes, scars.     Musculoskeletal:  ROM dec. By pain  Imaging Review Plain radiographs demonstrate severe degenerative joint disease of the right knee. The overall alignment is significant varus. The bone quality appears to be good for age and reported activity level.  MRI: 2 weeks ago shows medial and lateral meniscal tears  Assessment/Plan: arthritis, right knee, medial lateral meniscal tears  Pain control by admitting team  Has an appointment with [place] tomorrow but walking is serverly impaired by co-morbidities  Will discuss with dr Sherlean Foot about surgical intervention.  Willard Farquharson 08/29/2012, 10:00 AM

## 2012-08-30 ENCOUNTER — Observation Stay (HOSPITAL_COMMUNITY): Payer: BC Managed Care – PPO

## 2012-08-30 ENCOUNTER — Telehealth: Payer: Self-pay

## 2012-08-30 ENCOUNTER — Inpatient Hospital Stay (HOSPITAL_COMMUNITY): Payer: BC Managed Care – PPO

## 2012-08-30 DIAGNOSIS — J309 Allergic rhinitis, unspecified: Secondary | ICD-10-CM

## 2012-08-30 DIAGNOSIS — D649 Anemia, unspecified: Secondary | ICD-10-CM

## 2012-08-30 LAB — URINALYSIS, ROUTINE W REFLEX MICROSCOPIC
Glucose, UA: NEGATIVE mg/dL
Ketones, ur: NEGATIVE mg/dL
Nitrite: POSITIVE — AB
Protein, ur: NEGATIVE mg/dL
Specific Gravity, Urine: 1.028 (ref 1.005–1.030)
Urobilinogen, UA: 1 mg/dL (ref 0.0–1.0)
pH: 6 (ref 5.0–8.0)

## 2012-08-30 LAB — URINE MICROSCOPIC-ADD ON

## 2012-08-30 LAB — GLUCOSE, CAPILLARY
Glucose-Capillary: 102 mg/dL — ABNORMAL HIGH (ref 70–99)
Glucose-Capillary: 93 mg/dL (ref 70–99)
Glucose-Capillary: 116 mg/dL — ABNORMAL HIGH (ref 70–99)

## 2012-08-30 MED ORDER — HYDROCODONE-ACETAMINOPHEN 5-325 MG PO TABS: 1.0000 | ORAL_TABLET | ORAL | Status: DC | PRN

## 2012-08-30 MED ORDER — GADOBENATE DIMEGLUMINE 529 MG/ML IV SOLN
20.0000 mL | Freq: Once | INTRAVENOUS | Status: AC
  Administered 2012-08-30: 20 mL via INTRAVENOUS

## 2012-08-30 MED ORDER — OXYCODONE-ACETAMINOPHEN 5-325 MG PO TABS
1.0000 | ORAL_TABLET | Freq: Four times a day (QID) | ORAL | Status: DC | PRN
  Administered 2012-08-30 – 2012-09-02 (×9): 1 via ORAL
  Filled 2012-08-30 (×9): qty 1
  Filled 2012-08-30: qty 2

## 2012-08-30 MED ORDER — OXYCODONE HCL 5 MG PO TABS
5.0000 mg | ORAL_TABLET | Freq: Four times a day (QID) | ORAL | Status: DC | PRN
  Administered 2012-08-30 – 2012-08-31 (×5): 5 mg via ORAL
  Filled 2012-08-30 (×5): qty 1

## 2012-08-30 MED ORDER — MORPHINE SULFATE ER 15 MG PO TBCR
30.0000 mg | EXTENDED_RELEASE_TABLET | Freq: Two times a day (BID) | ORAL | Status: DC
  Administered 2012-08-30 – 2012-09-11 (×24): 30 mg via ORAL
  Filled 2012-08-30 (×17): qty 2
  Filled 2012-08-30: qty 1
  Filled 2012-08-30 (×2): qty 2
  Filled 2012-08-30: qty 1
  Filled 2012-08-30 (×4): qty 2

## 2012-08-30 MED ORDER — LEVOFLOXACIN IN D5W 500 MG/100ML IV SOLN
500.0000 mg | INTRAVENOUS | Status: DC
  Administered 2012-08-30 – 2012-08-31 (×2): 500 mg via INTRAVENOUS
  Filled 2012-08-30 (×3): qty 100

## 2012-08-30 MED ORDER — LIRAGLUTIDE 18 MG/3ML ~~LOC~~ SOLN
1.8000 mg | SUBCUTANEOUS | Status: DC
  Administered 2012-08-31: 1.8 mg via SUBCUTANEOUS

## 2012-08-30 NOTE — Telephone Encounter (Signed)
It appears as if this patient is currently hospitalized by record review.  Hannah Beat, MD 08/30/2012, 10:27 AM

## 2012-08-30 NOTE — Progress Notes (Signed)
UR COMPLETED  

## 2012-08-30 NOTE — Progress Notes (Signed)
SPORTS MEDICINE AND JOINT REPLACEMENT  Georgena Spurling, MD   Altamese Cabal, PA-C [address] [place], [place], [place]  [address]                             [phone]   PROGRESS NOTE  Subjective:  negative for Chest Pain  negative for Shortness of Breath  negative for Nausea/Vomiting   negative for Calf Pain  negative for Bowel Movement   Tolerating Diet: yes         Patient reports pain as 10 on 0-10 scale.    Objective: Vital signs in last 24 hours:   Patient Vitals for the past 24 hrs:  BP Temp Temp src Pulse Resp SpO2  08/30/12 0825 110/53 mmHg 99 F (37.2 C) - 93  16  -  08/30/12 0300 - 98.2 F (36.8 C) Oral - - -  08/30/12 0100 130/60 mmHg 100.4 F (38 C) Oral 103  20  94 %    @flow {1959:LAST@   Intake/Output from previous day:   12/01 0701 - 12/02 0700 In: 483 [P.O.:240; I.V.:243] Out: 650 [Urine:650]   Intake/Output this shift:   12/02 0701 - 12/02 1900 In: 480 [P.O.:480] Out: 600 [Urine:600]   Intake/Output      12/01 0701 - 12/02 0700 12/02 0701 - 12/03 0700   P.O. 240 480   I.V. (mL/kg) 243 (1.6)    Total Intake(mL/kg) 483 (3.2) 480 (3.2)   Urine (mL/kg/hr) 650 (0.2) 600 (0.5)   Total Output 650 600   Net -167 -120        Urine Occurrence 3 x       LABORATORY DATA:  Basename 08/28/12 2336  WBC 9.0  HGB 9.7*  HCT 29.9*  PLT 387    Basename 08/28/12 2336  NA 135  K 3.7  CL 97  CO2 28  BUN 15  CREATININE 0.64  GLUCOSE 120*  CALCIUM 9.1   Lab Results  Component Value Date   INR 1.23 08/29/2012    Examination:  General appearance: alert, cooperative, no distress and morbidly obese Extremities: Homans sign is negative, no sign of DVT  Wound Exam: clean, dry, intact   Drainage:  None: wound tissue dry  Motor Exam: EHL and FHL Intact  Sensory Exam: Deep Peroneal normal  Vascular Exam:    Assessment:          ADDITIONAL DIAGNOSIS:  Principal Problem:  *Right anterior knee pain Active Problems:  HYPOTHYROIDISM  DM  type 2 with diabetic peripheral neuropathy  ANEMIA-NOS  BIPOLAR AFFECTIVE DISORDER, DEPRESSED  HYPERTENSION  ASTHMA  GERD  Obesity, morbid (more than 100 lbs over ideal weight or BMI > 40)    Plan: Admitted for pain control, Dr Sherlean Foot conselted Dr Lovell Sheehan for back issues as he is a patient of his and we will look into scoping the knee on wednesday         Carden Teel 08/30/2012, 2:13 PM

## 2012-08-30 NOTE — Telephone Encounter (Signed)
[address] [place], [place] [address] p. [phone] f. [phone] To: [facility] (After Hours Triage) Fax: [phone] From: [facility] Date/ Time: 08/29/2012 12:53 AM Taken By: Jethro BolusJae Dire Facility: not collected Patient: Diara, Chaudhari DOB: 1950/12/11 Phone: [phone] Reason for Call: Pt has a torn meniscus and uis looking for hosp admission for pain management Regarding Appointment: Appt Date: Appt Time: Unknown Provider: Reason: Details: Outcome:

## 2012-08-30 NOTE — Progress Notes (Signed)
[facility]                                                                                Patient Demographics  Amy Barnes, is a 61 y.o. female  [id number]  [id number]  DOB - 01/01/1951  Admit date - 08/28/2012  Admitting Physician Lars Mage, MD  Outpatient Primary MD for the patient is [name], MD  LOS - 2   Chief Complaint  Patient presents with  . Knee Pain        Assessment & Plan    1. Right knee pain, unable to bear weight on the right leg, in a patient with morbid obesity and MRI evidence of both lateral and medial meniscal tear along with joint effusion - also has been consulted, continue pain control, further recommendations per orthopedics, we'll commence PT OT once cleared by orthopedics to bear weight and stand, for now patient is unable to stand up or bear weight at all on the right leg, may need surgical intervention, in my opinion she should meet criteria for inpatient admission at this time. She has been seen by Dr. Valentina Gu on 08-2012 who is considering surgical intervention, pain meds have been.     2. Diabetes mellitus type 2 morbid obesity - continue pre-meal NovoLog along with sliding scale insulin, will monitor sugars if needed long-acting insulin will be added.   CBG (last 3)   Basename 08/30/12 0654 08/29/12 2239 08/29/12 1749  GLUCAP 102* 85 137*     3. History of hypertension. Continue ARB, monitor blood pressure.    4. Hypothyroidism. Continue home dose Synthroid.    5. Dyslipidemia continue home dose statin.    6. Morbid obesity. Outpatient follow with PCP and dietician.     Code Status: full  Family Communication: patient and husband  Disposition Plan: TBD    Procedures     Consults  Orthopedics   Time Spent in minutes   45   Antibiotics     Anti-infectives    None      Scheduled Meds:    . aspirin EC  81 mg Oral Daily  . Chlorhexidine Gluconate Cloth  6 each Topical Q0600    . docusate sodium  300 mg Oral QHS  . enoxaparin (LOVENOX) injection  80 mg Subcutaneous Daily  . [COMPLETED]  HYDROmorphone (DILAUDID) injection  0.5 mg Intravenous Once  .  HYDROmorphone (DILAUDID) injection  1 mg Intravenous Once  . [COMPLETED] influenza  inactive virus vaccine  0.5 mL Intramuscular Tomorrow-1000  . insulin aspart  0-15 Units Subcutaneous TID WC  . insulin aspart  0-5 Units Subcutaneous QHS  . insulin aspart  4 Units Subcutaneous TID WC  . ketorolac  30 mg Intravenous Q6H  . lamoTRIgine  200 mg Oral Daily  . levothyroxine  150 mcg Oral QAC breakfast  . Liraglutide  1.8 mg Subcutaneous QAC breakfast  . LORazepam  0.5 mg Intravenous Once  . losartan  50 mg Oral Daily  . montelukast  10 mg Oral QHS  . morphine  30 mg Oral Q12H  . mupirocin ointment  1 application Nasal BID  . Oxcarbazepine  300 mg Oral Daily  .  OXcarbazepine  600 mg Oral QHS  . [COMPLETED] pneumococcal 23 valent vaccine  0.5 mL Intramuscular Tomorrow-1000  . simvastatin  40 mg Oral QPM  . sodium chloride  3 mL Intravenous Q12H  . [DISCONTINUED] enoxaparin (LOVENOX) injection  40 mg Subcutaneous Daily   Continuous Infusions:  PRN Meds:.sodium chloride, albuterol, HYDROcodone-acetaminophen, HYDROmorphone (DILAUDID) injection, methocarbamol, oxyCODONE, oxyCODONE-acetaminophen, sodium chloride   DVT Prophylaxis  Lovenox -  SCDs    Lab Results  Component Value Date   PLT 387 08/28/2012      Susa Raring K M.D on 08/30/2012 at 11:45 AM  Between 7am to 7pm - Pager - [phone]  After 7pm go to [email or web address] - password [email or web address]  And look for the night coverage person covering for me after hours  [facility] Office  [phone]    Subjective:   Amy Barnes today has, No headache, No chest pain, No abdominal pain - No Nausea, No new weakness tingling or numbness, No Cough - SOB. + rt knee pain.  Objective:   Filed Vitals:   08/29/12 1358 08/30/12 0100 08/30/12 0300 08/30/12  0825  BP: 136/57 130/60  110/53  Pulse: 94 103  93  Temp: 98.6 F (37 C) 100.4 F (38 C) 98.2 F (36.8 C) 99 F (37.2 C)  TempSrc:  Oral Oral   Resp: 20 20  16   Height:      Weight:      SpO2: 90% 94%      Wt Readings from Last 3 Encounters:  08/29/12 152.3 kg (335 lb 12.2 oz)  07/28/12 155.47 kg (342 lb 12 oz)  05/21/12 153.769 kg (339 lb)     Intake/Output Summary (Last 24 hours) at 08/30/12 1145 Last data filed at 08/30/12 0900  Gross per 24 hour  Intake    723 ml  Output    950 ml  Net   -227 ml    Exam Awake Alert, Oriented X 3, No new F.N deficits, Normal affect [place].AT,PERRAL Supple Neck,No JVD, No cervical lymphadenopathy appriciated.  Symmetrical Chest wall movement, Good air movement bilaterally, CTAB RRR,No Gallops,Rubs or new Murmurs, No Parasternal Heave +ve B.Sounds, Abd Soft, Non tender, No organomegaly appriciated, No rebound - guarding or rigidity. No Cyanosis, Clubbing or edema, No new Rash or bruise   R knee swollen, tender to palpate, no warmth or erythema   Data Review   Micro Results Recent Results (from the past 240 hour(s))  SURGICAL PCR SCREEN     Status: Abnormal   Collection Time   08/29/12  5:43 AM      Component Value Range Status Comment   MRSA, PCR POSITIVE (*) NEGATIVE Final    Staphylococcus aureus POSITIVE (*) NEGATIVE Final     Radiology Reports Dg Knee Right Port  08/29/2012  *RADIOLOGY REPORT*  Clinical Data: Severe right knee pain.  PORTABLE RIGHT KNEE - 1-2 VIEW  Comparison: None.  Findings: There is marked narrowing of the medial compartment with small marginal osteophytes. Slight sclerosis of the medial compartment.  Lateral and patellofemoral compartments appear essentially normal.  No appreciable joint effusion.  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   Original Report Authenticated By: Francene Boyers, M.D.     CBC  Lab 08/28/12 2336  WBC 9.0  HGB 9.7*  HCT 29.9*  PLT 387    MCV 82.4  MCH 26.7  MCHC 32.4  RDW 14.2  LYMPHSABS 1.6  MONOABS 1.1*  EOSABS 0.1  BASOSABS 0.0  BANDABS --  Chemistries   Lab 08/28/12 2336  NA 135  K 3.7  CL 97  CO2 28  GLUCOSE 120*  BUN 15  CREATININE 0.64  CALCIUM 9.1  MG --  AST --  ALT --  ALKPHOS --  BILITOT --   ------------------------------------------------------------------------------------------------------------------ estimated creatinine clearance is 113.9 ml/min (by C-G formula based on Cr of 0.64). ------------------------------------------------------------------------------------------------------------------ No results found for this basename: HGBA1C:2 in the last 72 hours ------------------------------------------------------------------------------------------------------------------ No results found for this basename: CHOL:2,HDL:2,LDLCALC:2,TRIG:2,CHOLHDL:2,LDLDIRECT:2 in the last 72 hours ------------------------------------------------------------------------------------------------------------------ No results found for this basename: TSH,T4TOTAL,FREET3,T3FREE,THYROIDAB in the last 72 hours ------------------------------------------------------------------------------------------------------------------ No results found for this basename: VITAMINB12:2,FOLATE:2,FERRITIN:2,TIBC:2,IRON:2,RETICCTPCT:2 in the last 72 hours  Coagulation profile  Lab 08/29/12 1200  INR 1.23  PROTIME --    No results found for this basename: DDIMER:2 in the last 72 hours  Cardiac Enzymes No results found for this basename: CK:3,CKMB:3,TROPONINI:3,MYOGLOBIN:3 in the last 168 hours ------------------------------------------------------------------------------------------------------------------ No components found with this basename: POCBNP:3

## 2012-08-30 NOTE — Telephone Encounter (Signed)
Triage Record Num: [id number] Operator: Dustin Flock [place] Patient Name: Amy Barnes Call Date & Time: 08/29/2012 12:37:59AM Patient Phone: [phone] PCP: [name] Patient Gender: Female PCP Fax : [phone] Patient DOB: 1951/09/07 [facility] Reason for Call: Caller: Kate/Other; PCP: [name] (Family Practice); CB#: [phone]; Call regarding Pt has a torn meniscus and is looking for hosp admission for pain management; Pt is Currently At [facility] ED. Torn Meniscus in right knee. ER Dr is wanting to send her home, but pt is requesting to be admitted for pain control. ED dr told family to call primary care and reccomend that she could be admitted for pain control. Advised hospitalist is utilized for admissions to hospital. Advised caller Lowanda Foster daughter pt) to talk with ED dr and let him know practice uses hospitalist for admissions. Verbalized understanding. Protocol(s) Used: Office Note Recommended Outcome per Protocol: Information Noted and Sent to Office Reason for Outcome: Caller information to office Care Advice: ~

## 2012-08-30 NOTE — Progress Notes (Signed)
Pt resting more comfortably since dilaudid being given q 2 hrs. Is more cooperative although she is still very tearful and states her pain remains above 5 even with IV dilaudid.

## 2012-08-31 ENCOUNTER — Encounter (HOSPITAL_COMMUNITY): Payer: Self-pay | Admitting: General Practice

## 2012-08-31 ENCOUNTER — Ambulatory Visit: Payer: BC Managed Care – PPO | Admitting: Family Medicine

## 2012-08-31 DIAGNOSIS — M79609 Pain in unspecified limb: Secondary | ICD-10-CM

## 2012-08-31 DIAGNOSIS — H8110 Benign paroxysmal vertigo, unspecified ear: Secondary | ICD-10-CM

## 2012-08-31 DIAGNOSIS — E039 Hypothyroidism, unspecified: Secondary | ICD-10-CM

## 2012-08-31 DIAGNOSIS — I10 Essential (primary) hypertension: Secondary | ICD-10-CM

## 2012-08-31 LAB — GLUCOSE, CAPILLARY
Glucose-Capillary: 95 mg/dL (ref 70–99)
Glucose-Capillary: 98 mg/dL (ref 70–99)
Glucose-Capillary: 82 mg/dL (ref 70–99)
Glucose-Capillary: 95 mg/dL (ref 70–99)

## 2012-08-31 LAB — PROTIME-INR
INR: 1.18 (ref 0.00–1.49)
Prothrombin Time: 14.8 seconds (ref 11.6–15.2)

## 2012-08-31 NOTE — Progress Notes (Signed)
[facility]                                                                                Patient Demographics  Amy Barnes, is a 61 y.o. female  ZOX:[id number]  [id number]  DOB - 02-02-1951  Admit date - 08/28/2012  Admitting Physician Lars Mage, MD  Outpatient Primary MD for the patient is Amy Nora, MD  LOS - 3   Chief Complaint  Patient presents with  . Knee Pain        Assessment & Plan   61 year old morbidly obese Caucasian female admitted 2 days ago for acute right knee pain due to medial and lateral meniscal tear, she is unable to stand up or bear weight on it, orthopedics planning for arthroscopic surgical intervention on 09/01/2012.    1. Right knee pain, unable to bear weight on the right leg, in a patient with morbid obesity and MRI evidence of both lateral and medial meniscal tear along with joint effusion - also has been consulted, continue pain control, further recommendations per orthopedics, for now patient is unable to stand up or bear weight at all on the right leg, She has been seen by Dr. Valentina Gu on 08-2012 who is considering surgical intervention on 09/01/2012 of note patient is on Lovenox for DVT prophylaxis however it is weight-based, pain meds have been adjusted she feels better. Also seen by Dr. Lovell Sheehan neurosurgeon as she has history of L-spine problem, L-spine MRI and Dr. Lovell Sheehan evaluation not consistent with any ongoing L-spine issues. Placed orders for nursing staff give tomorrow's Lovenox dose after discussing with orthopedics.     2. Diabetes mellitus type 2 morbid obesity - continue pre-meal NovoLog along with sliding scale insulin, will monitor sugars if needed long-acting insulin will be added.   CBG (last 3)   Basename 08/31/12 0742 08/30/12 2236 08/30/12 1638  GLUCAP 95 116* 93     3. History of hypertension. Continue ARB, monitor blood pressure.    4. Hypothyroidism. Continue home dose Synthroid.    5.  Dyslipidemia continue home dose statin.    6. Morbid obesity. Outpatient follow with PCP and dietician.     Code Status: full  Family Communication: patient and husband  Disposition Plan: TBD    Procedures     Consults  Orthopedics   Time Spent in minutes   45   Antibiotics     Anti-infectives     Start     Dose/Rate Route Frequency Ordered Stop   08/30/12 1700   levofloxacin (LEVAQUIN) IVPB 500 mg        500 mg 100 mL/hr over 60 Minutes Intravenous Every 24 hours 08/30/12 1649 09/02/12 1659          Scheduled Meds:    . aspirin EC  81 mg Oral Daily  . Chlorhexidine Gluconate Cloth  6 each Topical Q0600  . docusate sodium  300 mg Oral QHS  . enoxaparin (LOVENOX) injection  80 mg Subcutaneous Daily  . [COMPLETED] gadobenate dimeglumine  20 mL Intravenous Once  .  HYDROmorphone (DILAUDID) injection  1 mg Intravenous Once  . [COMPLETED] influenza  inactive virus vaccine  0.5 mL Intramuscular Tomorrow-1000  .  insulin aspart  0-15 Units Subcutaneous TID WC  . insulin aspart  0-5 Units Subcutaneous QHS  . insulin aspart  4 Units Subcutaneous TID WC  . ketorolac  30 mg Intravenous Q6H  . lamoTRIgine  200 mg Oral Daily  . levofloxacin (LEVAQUIN) IV  500 mg Intravenous Q24H  . levothyroxine  150 mcg Oral QAC breakfast  . Liraglutide  1.8 mg Subcutaneous Q24H  . LORazepam  0.5 mg Intravenous Once  . losartan  50 mg Oral Daily  . montelukast  10 mg Oral QHS  . morphine  30 mg Oral Q12H  . mupirocin ointment  1 application Nasal BID  . Oxcarbazepine  300 mg Oral Daily  . OXcarbazepine  600 mg Oral QHS  . [COMPLETED] pneumococcal 23 valent vaccine  0.5 mL Intramuscular Tomorrow-1000  . simvastatin  40 mg Oral QPM  . sodium chloride  3 mL Intravenous Q12H  . [DISCONTINUED] Liraglutide  1.8 mg Subcutaneous QAC breakfast   Continuous Infusions:  PRN Meds:.sodium chloride, albuterol, HYDROcodone-acetaminophen, HYDROmorphone (DILAUDID) injection, methocarbamol,  oxyCODONE, oxyCODONE-acetaminophen, sodium chloride   DVT Prophylaxis  Lovenox -  SCDs    Lab Results  Component Value Date   PLT 387 08/28/2012      Susa Raring K M.D on 08/31/2012 at 10:30 AM  Between 7am to 7pm - Pager - [phone]  After 7pm go to [email or web address] - password [email or web address]  And look for the night coverage person covering for me after hours  [facility] Office  [phone]    Subjective:   [name] today has, No headache, No chest pain, No abdominal pain - No Nausea, No new weakness tingling or numbness, No Cough - SOB. + rt knee pain.  Objective:   Filed Vitals:   08/30/12 0825 08/30/12 1448 08/30/12 2239 08/31/12 0606  BP: 110/53 128/58 107/56 118/57  Pulse: 93 82 78 86  Temp: 99 F (37.2 C) 98.4 F (36.9 C) 97.9 F (36.6 C) 98.8 F (37.1 C)  TempSrc:      Resp: 16 18 16 16   Height:      Weight:      SpO2:  98% 97% 98%    Wt Readings from Last 3 Encounters:  08/29/12 152.3 kg (335 lb 12.2 oz)  07/28/12 155.47 kg (342 lb 12 oz)  05/21/12 153.769 kg (339 lb)     Intake/Output Summary (Last 24 hours) at 08/31/12 1030 Last data filed at 08/31/12 0700  Gross per 24 hour  Intake    720 ml  Output    450 ml  Net    270 ml    Exam Awake Alert, Oriented X 3, No new F.N deficits, Normal affect [place].AT,PERRAL Supple Neck,No JVD, No cervical lymphadenopathy appriciated.  Symmetrical Chest wall movement, Good air movement bilaterally, CTAB RRR,No Gallops,Rubs or new Murmurs, No Parasternal Heave +ve B.Sounds, Abd Soft, Non tender, No organomegaly appriciated, No rebound - guarding or rigidity. No Cyanosis, Clubbing or edema, No new Rash or bruise   R knee swollen, tender to palpate, no warmth or erythema   Data Review   Micro Results Recent Results (from the past 240 hour(s))  SURGICAL PCR SCREEN     Status: Abnormal   Collection Time   08/29/12  5:43 AM      Component Value Range Status Comment   MRSA, PCR POSITIVE (*)  NEGATIVE Final    Staphylococcus aureus POSITIVE (*) NEGATIVE Final     Radiology Reports Dg Knee Right Port  08/29/2012  *RADIOLOGY  REPORT*  Clinical Data: Severe right knee pain.  PORTABLE RIGHT KNEE - 1-2 VIEW  Comparison: None.  Findings: There is marked narrowing of the medial compartment with small marginal osteophytes. Slight sclerosis of the medial compartment.  Lateral and patellofemoral compartments appear essentially normal.  No appreciable joint effusion.  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   Original Report Authenticated By: Francene Boyers, M.D.     CBC  Lab 08/28/12 2336  WBC 9.0  HGB 9.7*  HCT 29.9*  PLT 387  MCV 82.4  MCH 26.7  MCHC 32.4  RDW 14.2  LYMPHSABS 1.6  MONOABS 1.1*  EOSABS 0.1  BASOSABS 0.0  BANDABS --    Chemistries   Lab 08/28/12 2336  NA 135  K 3.7  CL 97  CO2 28  GLUCOSE 120*  BUN 15  CREATININE 0.64  CALCIUM 9.1  MG --  AST --  ALT --  ALKPHOS --  BILITOT --   ------------------------------------------------------------------------------------------------------------------ estimated creatinine clearance is 113.9 ml/min (by C-G formula based on Cr of 0.64). ------------------------------------------------------------------------------------------------------------------ No results found for this basename: HGBA1C:2 in the last 72 hours ------------------------------------------------------------------------------------------------------------------ No results found for this basename: CHOL:2,HDL:2,LDLCALC:2,TRIG:2,CHOLHDL:2,LDLDIRECT:2 in the last 72 hours ------------------------------------------------------------------------------------------------------------------ No results found for this basename: TSH,T4TOTAL,FREET3,T3FREE,THYROIDAB in the last 72 hours ------------------------------------------------------------------------------------------------------------------ No results found  for this basename: VITAMINB12:2,FOLATE:2,FERRITIN:2,TIBC:2,IRON:2,RETICCTPCT:2 in the last 72 hours  Coagulation profile  Lab 08/29/12 1200  INR 1.23  PROTIME --    No results found for this basename: DDIMER:2 in the last 72 hours  Cardiac Enzymes No results found for this basename: CK:3,CKMB:3,TROPONINI:3,MYOGLOBIN:3 in the last 168 hours ------------------------------------------------------------------------------------------------------------------ No components found with this basename: POCBNP:3

## 2012-08-31 NOTE — Consult Note (Signed)
Reason for Consult: Right knee pain. Referring Physician: Dr. Quintella Reichert  Amy Barnes is an 61 y.o. female.  HPI: Amy Barnes is a 61 year old [name] female who I know well. She has had severe right knee pain. In fact I saw her in the office last week. I thought the pain was more likely coming from her knee but I had ordered a lumbar MRI just to make sure that there was a significant lumbar spine pathology contributing to her pain. She had not gotten the MRI scan yet and was admitted for pain control. The patient's lumbar spine MR scan was done last night.  Presently the patient is coming by her husband she complains of right knee pain.  Past Medical History  Diagnosis Date  . Anemia   . Osteoarthritis   . Asthma   . Diabetes mellitus   . Hypothyroid   . GERD (gastroesophageal reflux disease)   . Allergic rhinitis   . Hypertension   . Bipolar disorder     takes Synthroid meds for Bipolar    Past Surgical History  Procedure Date  . Total hip arthroplasty 2002    Left  . Partial hysterectomy 1982    for mennorhagia, no cervix  . Foot surgery 1982    bone spur  . Neck surgery     Herniated disk C2,3,4  . Tmj arthroplasty 1982  . Hospitalized     for bipolar  . Abdominal hysterectomy   . Joint replacement     2002  . Carpal tunnel release 2002    Family History  Problem Relation Age of Onset  . Aneurysm Father   . Alcohol abuse Father   . Heart attack      Siblings  . Coronary artery disease      Family history 1st degree relative <50/Family history 1st degree relative <60/Siblings  . Anesthesia problems Neg Hx   . Hypotension Neg Hx   . Malignant hyperthermia Neg Hx   . Pseudochol deficiency Neg Hx     Social History:  reports that she quit smoking about 3 years ago. Her smoking use included Cigarettes. She smoked 1.5 packs per day. She has never used smokeless tobacco. She reports that she drinks alcohol. She reports that she does not use illicit  drugs.  Allergies:  Allergies  Allergen Reactions  . Cephalexin Hives  . Sulfonamide Derivatives     REACTION: Rash    Medications:  I have reviewed the patient's current medications. Prior to Admission:  Prescriptions prior to admission  Medication Sig Dispense Refill  . acetaminophen (TYLENOL) 500 MG tablet Take 1,000 mg by mouth every 6 (six) hours as needed. For pain      . albuterol (PROVENTIL HFA;VENTOLIN HFA) 108 (90 BASE) MCG/ACT inhaler Inhale 2 puffs into the lungs every 6 (six) hours as needed. For shortness of breath      . aspirin 81 MG EC tablet Take 81 mg by mouth daily.        . Calcium Carbonate-Vitamin D (CALCIUM + D PO) Take 1 tablet by mouth daily.      . cyclobenzaprine (FLEXERIL) 10 MG tablet Take 10 mg by mouth 3 (three) times daily as needed. For muscle pain      . docusate sodium (COLACE) 100 MG capsule Take 300 mg by mouth at bedtime.      . furosemide (LASIX) 80 MG tablet Take 40 mg by mouth 2 (two) times daily as needed. For fluid      .  ibuprofen (ADVIL,MOTRIN) 800 MG tablet Take 800 mg by mouth every 8 (eight) hours as needed.      . IRON PO Take 75 mg by mouth daily.      Marland Kitchen lamoTRIgine (LAMICTAL) 200 MG tablet Take 200 mg by mouth daily.        Marland Kitchen levothyroxine (SYNTHROID, LEVOTHROID) 150 MCG tablet Take 150 mcg by mouth daily.      . Liraglutide 18 MG/3ML SOLN Inject 1.8 mg into the skin daily.      Marland Kitchen losartan (COZAAR) 50 MG tablet Take 50 mg by mouth daily.      . metFORMIN (GLUCOPHAGE-XR) 500 MG 24 hr tablet Take 500 mg by mouth 3 (three) times daily.       . montelukast (SINGULAIR) 10 MG tablet Take 10 mg by mouth at bedtime.      Marland Kitchen morphine (MS CONTIN) 15 MG 12 hr tablet Take 30 mg by mouth 2 (two) times daily.       . Multiple Vitamins-Minerals (CENTRUM SILVER PO) Take 1 tablet by mouth daily.      . Oxcarbazepine (TRILEPTAL) 300 MG tablet Take 300-600 mg by mouth 2 (two) times daily. 1 in the morning and 2 every evening      .  oxyCODONE-acetaminophen (PERCOCET) 10-325 MG per tablet Take 1 tablet by mouth every 6 (six) hours as needed. For pain      . predniSONE (DELTASONE) 10 MG tablet Take 10 mg by mouth daily. Take for 5 days. Started medication on 08/25/12      . promethazine (PHENERGAN) 25 MG tablet Take 25 mg by mouth every 6 (six) hours as needed. For nausea      . simvastatin (ZOCOR) 40 MG tablet Take 40 mg by mouth every evening.       Scheduled:   . aspirin EC  81 mg Oral Daily  . Chlorhexidine Gluconate Cloth  6 each Topical Q0600  . docusate sodium  300 mg Oral QHS  . enoxaparin (LOVENOX) injection  80 mg Subcutaneous Daily  . [COMPLETED] gadobenate dimeglumine  20 mL Intravenous Once  .  HYDROmorphone (DILAUDID) injection  1 mg Intravenous Once  . [COMPLETED] influenza  inactive virus vaccine  0.5 mL Intramuscular Tomorrow-1000  . insulin aspart  0-15 Units Subcutaneous TID WC  . insulin aspart  0-5 Units Subcutaneous QHS  . insulin aspart  4 Units Subcutaneous TID WC  . ketorolac  30 mg Intravenous Q6H  . lamoTRIgine  200 mg Oral Daily  . levofloxacin (LEVAQUIN) IV  500 mg Intravenous Q24H  . levothyroxine  150 mcg Oral QAC breakfast  . Liraglutide  1.8 mg Subcutaneous Q24H  . LORazepam  0.5 mg Intravenous Once  . losartan  50 mg Oral Daily  . montelukast  10 mg Oral QHS  . morphine  30 mg Oral Q12H  . mupirocin ointment  1 application Nasal BID  . Oxcarbazepine  300 mg Oral Daily  . OXcarbazepine  600 mg Oral QHS  . [COMPLETED] pneumococcal 23 valent vaccine  0.5 mL Intramuscular Tomorrow-1000  . simvastatin  40 mg Oral QPM  . sodium chloride  3 mL Intravenous Q12H  . [DISCONTINUED] Liraglutide  1.8 mg Subcutaneous QAC breakfast   Continuous:  ZOX:WRUEAV chloride, albuterol, HYDROcodone-acetaminophen, HYDROmorphone (DILAUDID) injection, methocarbamol, oxyCODONE, oxyCODONE-acetaminophen, sodium chloride Anti-infectives     Start     Dose/Rate Route Frequency Ordered Stop   08/30/12 1700    levofloxacin (LEVAQUIN) IVPB 500 mg  500 mg 100 mL/hr over 60 Minutes Intravenous Every 24 hours 08/30/12 1649 09/02/12 1659            Results for orders placed during the hospital encounter of 08/28/12 (from the past 48 hour(s))  GLUCOSE, CAPILLARY     Status: Normal   Collection Time   08/29/12 11:52 AM      Component Value Range Comment   Glucose-Capillary 98  70 - 99 mg/dL    Comment 1 Notify RN     PROTIME-INR     Status: Abnormal   Collection Time   08/29/12 12:00 PM      Component Value Range Comment   Prothrombin Time 15.3 (*) 11.6 - 15.2 seconds    INR 1.23  0.00 - 1.49   GLUCOSE, CAPILLARY     Status: Abnormal   Collection Time   08/29/12  5:49 PM      Component Value Range Comment   Glucose-Capillary 137 (*) 70 - 99 mg/dL   GLUCOSE, CAPILLARY     Status: Normal   Collection Time   08/29/12 10:39 PM      Component Value Range Comment   Glucose-Capillary 85  70 - 99 mg/dL   GLUCOSE, CAPILLARY     Status: Abnormal   Collection Time   08/30/12  6:54 AM      Component Value Range Comment   Glucose-Capillary 102 (*) 70 - 99 mg/dL   URINALYSIS, ROUTINE W REFLEX MICROSCOPIC     Status: Abnormal   Collection Time   08/30/12  3:11 PM      Component Value Range Comment   Color, Urine AMBER (*) YELLOW BIOCHEMICALS MAY BE AFFECTED BY COLOR   APPearance CLOUDY (*) CLEAR    Specific Gravity, Urine 1.028  1.005 - 1.030    pH 6.0  5.0 - 8.0    Glucose, UA NEGATIVE  NEGATIVE mg/dL    Hgb urine dipstick SMALL (*) NEGATIVE    Bilirubin Urine SMALL (*) NEGATIVE    Ketones, ur NEGATIVE  NEGATIVE mg/dL    Protein, ur NEGATIVE  NEGATIVE mg/dL    Urobilinogen, UA 1.0  0.0 - 1.0 mg/dL    Nitrite POSITIVE (*) NEGATIVE    Leukocytes, UA LARGE (*) NEGATIVE   URINE MICROSCOPIC-ADD ON     Status: Abnormal   Collection Time   08/30/12  3:11 PM      Component Value Range Comment   Squamous Epithelial / LPF FEW (*) RARE    WBC, UA TOO NUMEROUS TO COUNT  <3 WBC/hpf    RBC / HPF  3-6  <3 RBC/hpf    Bacteria, UA MANY (*) RARE   GLUCOSE, CAPILLARY     Status: Normal   Collection Time   08/30/12  4:38 PM      Component Value Range Comment   Glucose-Capillary 93  70 - 99 mg/dL   GLUCOSE, CAPILLARY     Status: Abnormal   Collection Time   08/30/12 10:36 PM      Component Value Range Comment   Glucose-Capillary 116 (*) 70 - 99 mg/dL     Dg Chest Port 1 View  08/30/2012  *RADIOLOGY REPORT*  Clinical Data: Cough and weakness.  PORTABLE CHEST - 1 VIEW  Comparison: 01/08/2012 and 01/02/2012.  Findings: Central pulmonary vascular prominence without pulmonary edema.  No segmental infiltrate or gross pneumothorax.  Calcified slightly tortuous aorta.  Heart size top normal.  Postsurgical changes lower cervical spine.  IMPRESSION: No acute abnormality.  If there is a persistent unexplained cough, follow-up two-view exam may be considered.   Original Report Authenticated By: Lacy Duverney, M.D.    Dg Knee Right Port  08/29/2012  *RADIOLOGY REPORT*  Clinical Data: Severe right knee pain.  PORTABLE RIGHT KNEE - 1-2 VIEW  Comparison: None.  Findings: There is marked narrowing of the medial compartment with small marginal osteophytes. Slight sclerosis of the medial compartment.  Lateral and patellofemoral compartments appear essentially normal.  No appreciable joint effusion.  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   Original Report Authenticated By: Francene Boyers, M.D.      Blood pressure 118/57, pulse 86, temperature 98.8 F (37.1 C), temperature source Oral, resp. rate 16, height 5\' 6"  (1.676 m), weight 152.3 kg (335 lb 12.2 oz), SpO2 98.00%. On physical exam the patient is moving all 4 extremities. Her right knee is quite obese. She has tenderness to palpation and range of motion.  I have reviewed the patient's lumbar MRI performed yesterday at [facility] with a without contrast. It demonstrates good position of the education L3-4 she  has a good decompression at that level. I don't see any explanation of her right leg pain on his MR scan. I.e. no significant ruptured disc, stenosis, fractures, infection, etc.  Assessment/Plan: Right knee pain: I don't see any explanation for this on her lumbar MRI scan. Hopefully it will improve with tomorrow's knee arthroscopy by Dr. Valentina Gu. I have answered all the patient and her husband's questions.  Milus Fritze D 08/31/2012, 7:48 AM

## 2012-09-01 ENCOUNTER — Encounter (HOSPITAL_COMMUNITY): Payer: Self-pay | Admitting: *Deleted

## 2012-09-01 ENCOUNTER — Inpatient Hospital Stay (HOSPITAL_COMMUNITY): Payer: BC Managed Care – PPO | Admitting: *Deleted

## 2012-09-01 ENCOUNTER — Inpatient Hospital Stay (HOSPITAL_COMMUNITY): Payer: BC Managed Care – PPO

## 2012-09-01 ENCOUNTER — Encounter (HOSPITAL_COMMUNITY)
Admission: EM | Disposition: A | Discharge status: Home Health | Payer: Self-pay | Source: Home / Self Care | Attending: Internal Medicine

## 2012-09-01 HISTORY — PX: KNEE ARTHROSCOPY: SHX127

## 2012-09-01 LAB — HEMOGLOBIN A1C
Hgb A1c MFr Bld: 6.2 % — ABNORMAL HIGH (ref <5.7)
Mean Plasma Glucose: 131 mg/dL — ABNORMAL HIGH (ref <117)

## 2012-09-01 LAB — BASIC METABOLIC PANEL
BUN: 13 mg/dL (ref 6–23)
CO2: 30 mEq/L (ref 19–32)
Calcium: 9.5 mg/dL (ref 8.4–10.5)
Chloride: 94 mEq/L — ABNORMAL LOW (ref 96–112)
Creatinine, Ser: 0.65 mg/dL (ref 0.50–1.10)
GFR calc Af Amer: >90 mL/min (ref >90)
GFR calc non Af Amer: >90 mL/min (ref >90)
Glucose, Bld: 107 mg/dL — ABNORMAL HIGH (ref 70–99)
Potassium: 4.5 mEq/L (ref 3.5–5.1)
Sodium: 131 mEq/L — ABNORMAL LOW (ref 135–145)

## 2012-09-01 LAB — URINE CULTURE: Colony Count: >=100000

## 2012-09-01 LAB — CBC
HCT: 27 % — ABNORMAL LOW (ref 36.0–46.0)
Hemoglobin: 8.7 g/dL — ABNORMAL LOW (ref 12.0–15.0)
MCH: 26.6 pg (ref 26.0–34.0)
MCHC: 32.2 g/dL (ref 30.0–36.0)
MCV: 82.6 fL (ref 78.0–100.0)
Platelets: 377 10*3/uL (ref 150–400)
RBC: 3.27 MIL/uL — ABNORMAL LOW (ref 3.87–5.11)
RDW: 14.4 % (ref 11.5–15.5)
WBC: 7.6 10*3/uL (ref 4.0–10.5)

## 2012-09-01 LAB — TYPE AND SCREEN
ABO/RH(D): A POS
Antibody Screen: POSITIVE
DAT, IgG: NEGATIVE
PT AG Type: NEGATIVE

## 2012-09-01 LAB — GLUCOSE, CAPILLARY
Glucose-Capillary: 91 mg/dL (ref 70–99)
Glucose-Capillary: 128 mg/dL — ABNORMAL HIGH (ref 70–99)
Glucose-Capillary: 129 mg/dL — ABNORMAL HIGH (ref 70–99)
Glucose-Capillary: 120 mg/dL — ABNORMAL HIGH (ref 70–99)

## 2012-09-01 SURGERY — ARTHROSCOPY, KNEE
Anesthesia: General | Site: Knee | Laterality: Right | Wound class: Clean

## 2012-09-01 MED ORDER — BUPIVACAINE-EPINEPHRINE 0.5% -1:200000 IJ SOLN
INTRAMUSCULAR | Status: DC | PRN
  Administered 2012-09-01: 20 mL

## 2012-09-01 MED ORDER — DEXMEDETOMIDINE HCL IN NACL 200 MCG/50ML IV SOLN
0.4000 ug/kg/h | INTRAVENOUS | Status: AC
  Administered 2012-09-01: 0.7 ug/kg/h via INTRAVENOUS
  Filled 2012-09-01: qty 50

## 2012-09-01 MED ORDER — OXYCODONE HCL 5 MG PO TABS
5.0000 mg | ORAL_TABLET | ORAL | Status: DC | PRN
  Administered 2012-09-01: 10 mg via ORAL
  Administered 2012-09-02 (×2): 5 mg via ORAL
  Filled 2012-09-01 (×2): qty 1
  Filled 2012-09-01: qty 2

## 2012-09-01 MED ORDER — BUPIVACAINE-EPINEPHRINE (PF) 0.5% -1:200000 IJ SOLN
INTRAMUSCULAR | Status: AC
  Filled 2012-09-01: qty 10

## 2012-09-01 MED ORDER — MIDAZOLAM HCL 2 MG/2ML IJ SOLN
1.0000 mg | Freq: Once | INTRAMUSCULAR | Status: AC
  Administered 2012-09-01: 1 mg via INTRAVENOUS

## 2012-09-01 MED ORDER — ACETAMINOPHEN 10 MG/ML IV SOLN
1000.0000 mg | Freq: Once | INTRAVENOUS | Status: AC | PRN
  Administered 2012-09-01: 1000 mg via INTRAVENOUS
  Filled 2012-09-01 (×2): qty 100

## 2012-09-01 MED ORDER — HYDROMORPHONE HCL PF 1 MG/ML IJ SOLN
1.0000 mg | INTRAMUSCULAR | Status: DC | PRN
  Administered 2012-09-01 – 2012-09-09 (×44): 1 mg via INTRAVENOUS
  Filled 2012-09-01 (×43): qty 1

## 2012-09-01 MED ORDER — GLYCOPYRROLATE 0.2 MG/ML IJ SOLN
INTRAMUSCULAR | Status: DC | PRN
  Administered 2012-09-01: 0.6 mg via INTRAVENOUS

## 2012-09-01 MED ORDER — NEOSTIGMINE METHYLSULFATE 1 MG/ML IJ SOLN
INTRAMUSCULAR | Status: DC | PRN
  Administered 2012-09-01: 4 mg via INTRAVENOUS

## 2012-09-01 MED ORDER — LIDOCAINE HCL (CARDIAC) 20 MG/ML IV SOLN
INTRAVENOUS | Status: DC | PRN
  Administered 2012-09-01: 10 mg via INTRAVENOUS

## 2012-09-01 MED ORDER — VANCOMYCIN HCL 1000 MG IV SOLR: 1000.0000 mg | Freq: Once | INTRAVENOUS | Status: DC

## 2012-09-01 MED ORDER — CHLORHEXIDINE GLUCONATE 4 % EX LIQD
60.0000 mL | Freq: Once | CUTANEOUS | Status: DC
  Filled 2012-09-01: qty 60

## 2012-09-01 MED ORDER — SODIUM CHLORIDE 0.9 % IR SOLN
Status: DC | PRN
  Administered 2012-09-01 (×2): 3000 mL

## 2012-09-01 MED ORDER — ASPIRIN EC 325 MG PO TBEC
325.0000 mg | DELAYED_RELEASE_TABLET | Freq: Every day | ORAL | Status: DC
  Administered 2012-09-02 – 2012-09-05 (×4): 325 mg via ORAL
  Filled 2012-09-01 (×7): qty 1

## 2012-09-01 MED ORDER — KETOROLAC TROMETHAMINE 30 MG/ML IJ SOLN
INTRAMUSCULAR | Status: AC
  Filled 2012-09-01: qty 1

## 2012-09-01 MED ORDER — PROPOFOL 10 MG/ML IV BOLUS
INTRAVENOUS | Status: DC | PRN
  Administered 2012-09-01: 160 mg via INTRAVENOUS

## 2012-09-01 MED ORDER — LACTATED RINGERS IV SOLN
INTRAVENOUS | Status: DC | PRN
  Administered 2012-09-01: 09:00:00 via INTRAVENOUS

## 2012-09-01 MED ORDER — PHENOL 1.4 % MT LIQD: 1.0000 | OROMUCOSAL | Status: DC | PRN

## 2012-09-01 MED ORDER — ACETAMINOPHEN 650 MG RE SUPP: 650.0000 mg | Freq: Four times a day (QID) | RECTAL | Status: DC | PRN

## 2012-09-01 MED ORDER — VANCOMYCIN HCL 10 G IV SOLR
2000.0000 mg | INTRAVENOUS | Status: AC
  Administered 2012-09-01: 2000 mg via INTRAVENOUS
  Filled 2012-09-01: qty 2000

## 2012-09-01 MED ORDER — HYDROMORPHONE HCL PF 1 MG/ML IJ SOLN
INTRAMUSCULAR | Status: AC
  Filled 2012-09-01: qty 1

## 2012-09-01 MED ORDER — FENTANYL CITRATE 0.05 MG/ML IJ SOLN
INTRAMUSCULAR | Status: DC | PRN
  Administered 2012-09-01: 50 ug via INTRAVENOUS
  Administered 2012-09-01: 100 ug via INTRAVENOUS
  Administered 2012-09-01 (×4): 50 ug via INTRAVENOUS
  Administered 2012-09-01: 100 ug via INTRAVENOUS
  Administered 2012-09-01: 50 ug via INTRAVENOUS

## 2012-09-01 MED ORDER — LEVOFLOXACIN 500 MG PO TABS
500.0000 mg | ORAL_TABLET | ORAL | Status: AC
  Administered 2012-09-01 – 2012-09-02 (×2): 500 mg via ORAL
  Filled 2012-09-01 (×2): qty 1

## 2012-09-01 MED ORDER — MIDAZOLAM HCL 2 MG/2ML IJ SOLN
INTRAMUSCULAR | Status: AC
  Filled 2012-09-01: qty 2

## 2012-09-01 MED ORDER — METOCLOPRAMIDE HCL 5 MG/ML IJ SOLN: 5.0000 mg | Freq: Three times a day (TID) | INTRAMUSCULAR | Status: DC | PRN

## 2012-09-01 MED ORDER — MIDAZOLAM HCL 5 MG/5ML IJ SOLN
INTRAMUSCULAR | Status: DC | PRN
  Administered 2012-09-01: 2 mg via INTRAVENOUS

## 2012-09-01 MED ORDER — MORPHINE SULFATE 10 MG/ML IJ SOLN
INTRAMUSCULAR | Status: DC | PRN
  Administered 2012-09-01: 2 mg via INTRAVENOUS
  Administered 2012-09-01 (×2): 4 mg via INTRAVENOUS

## 2012-09-01 MED ORDER — METOCLOPRAMIDE HCL 10 MG PO TABS: 5.0000 mg | ORAL_TABLET | Freq: Three times a day (TID) | ORAL | Status: DC | PRN

## 2012-09-01 MED ORDER — ONDANSETRON HCL 4 MG PO TABS: 4.0000 mg | ORAL_TABLET | Freq: Four times a day (QID) | ORAL | Status: DC | PRN

## 2012-09-01 MED ORDER — HYDROMORPHONE HCL PF 1 MG/ML IJ SOLN
0.2500 mg | INTRAMUSCULAR | Status: DC | PRN
  Administered 2012-09-01 (×4): 0.5 mg via INTRAVENOUS

## 2012-09-01 MED ORDER — ROCURONIUM BROMIDE 100 MG/10ML IV SOLN
INTRAVENOUS | Status: DC | PRN
  Administered 2012-09-01: 30 mg via INTRAVENOUS

## 2012-09-01 MED ORDER — VANCOMYCIN HCL IN DEXTROSE 1-5 GM/200ML-% IV SOLN
1000.0000 mg | Freq: Two times a day (BID) | INTRAVENOUS | Status: AC
  Administered 2012-09-02: 1000 mg via INTRAVENOUS
  Filled 2012-09-01: qty 200

## 2012-09-01 MED ORDER — ACETAMINOPHEN 325 MG PO TABS
650.0000 mg | ORAL_TABLET | Freq: Four times a day (QID) | ORAL | Status: DC | PRN
  Administered 2012-09-02 – 2012-09-07 (×4): 650 mg via ORAL
  Filled 2012-09-01 (×3): qty 2

## 2012-09-01 MED ORDER — LACTATED RINGERS IV SOLN
INTRAVENOUS | Status: DC | PRN
  Administered 2012-09-01: 13:00:00 via INTRAVENOUS

## 2012-09-01 MED ORDER — MENTHOL 3 MG MT LOZG: 1.0000 | LOZENGE | OROMUCOSAL | Status: DC | PRN

## 2012-09-01 MED ORDER — ONDANSETRON HCL 4 MG/2ML IJ SOLN: 4.0000 mg | Freq: Once | INTRAMUSCULAR | Status: AC | PRN

## 2012-09-01 MED ORDER — ONDANSETRON HCL 4 MG/2ML IJ SOLN
INTRAMUSCULAR | Status: DC | PRN
  Administered 2012-09-01: 4 mg via INTRAVENOUS

## 2012-09-01 MED ORDER — ONDANSETRON HCL 4 MG/2ML IJ SOLN: 4.0000 mg | Freq: Four times a day (QID) | INTRAMUSCULAR | Status: DC | PRN

## 2012-09-01 SURGICAL SUPPLY — 52 items
BANDAGE ELASTIC 6 VELCRO ST LF (GAUZE/BANDAGES/DRESSINGS) ×1 IMPLANT
BANDAGE ESMARK 6X9 LF (GAUZE/BANDAGES/DRESSINGS) IMPLANT
BLADE CUDA 5.5 (BLADE) IMPLANT
BLADE CUTTER GATOR 3.5 (BLADE) IMPLANT
BLADE GREAT WHITE 4.2 (BLADE) ×2 IMPLANT
BNDG CMPR 9X6 STRL LF SNTH (GAUZE/BANDAGES/DRESSINGS)
BNDG CMPR MED 10X6 ELC LF (GAUZE/BANDAGES/DRESSINGS) ×1
BNDG ELASTIC 6X10 VLCR STRL LF (GAUZE/BANDAGES/DRESSINGS) ×1 IMPLANT
BNDG ESMARK 6X9 LF (GAUZE/BANDAGES/DRESSINGS)
BUR OVAL 6.0 (BURR) IMPLANT
CLOTH BEACON ORANGE TIMEOUT ST (SAFETY) ×2 IMPLANT
CUFF TOURNIQUET SINGLE 34IN LL (TOURNIQUET CUFF) IMPLANT
DRAPE ARTHROSCOPY W/POUCH 114 (DRAPES) ×2 IMPLANT
DRAPE U-SHAPE 47X51 STRL (DRAPES) ×2 IMPLANT
DRSG PAD ABDOMINAL 8X10 ST (GAUZE/BANDAGES/DRESSINGS) ×2 IMPLANT
ELECT CAUTERY BLADE 6.4 (BLADE) IMPLANT
ELECT MENISCUS 165MM 90D (ELECTRODE) IMPLANT
ELECT REM PT RETURN 9FT ADLT (ELECTROSURGICAL)
ELECTRODE REM PT RTRN 9FT ADLT (ELECTROSURGICAL) IMPLANT
GAUZE XEROFORM 1X8 LF (GAUZE/BANDAGES/DRESSINGS) ×2 IMPLANT
GLOVE BIO SURGEON STRL SZ 6.5 (GLOVE) ×1 IMPLANT
GLOVE BIOGEL PI IND STRL 7.0 (GLOVE) IMPLANT
GLOVE BIOGEL PI IND STRL 7.5 (GLOVE) IMPLANT
GLOVE BIOGEL PI IND STRL 8.5 (GLOVE) ×2 IMPLANT
GLOVE BIOGEL PI INDICATOR 7.0 (GLOVE) ×2
GLOVE BIOGEL PI INDICATOR 7.5 (GLOVE) ×1
GLOVE BIOGEL PI INDICATOR 8.5 (GLOVE) ×2
GLOVE SURG ORTHO 7.0 STRL STRW (GLOVE) IMPLANT
GLOVE SURG ORTHO 8.0 STRL STRW (GLOVE) ×4 IMPLANT
GLOVE SURG SS PI 6.5 STRL IVOR (GLOVE) ×2 IMPLANT
GOWN BRE IMP SLV AUR XL STRL (GOWN DISPOSABLE) ×4 IMPLANT
GOWN STRL NON-REIN LRG LVL3 (GOWN DISPOSABLE) ×4 IMPLANT
HOVERMATT SINGLE USE (MISCELLANEOUS) ×1 IMPLANT
KIT ROOM TURNOVER OR (KITS) ×2 IMPLANT
MANIFOLD NEPTUNE II (INSTRUMENTS) ×2 IMPLANT
NDL 18GX1X1/2 (RX/OR ONLY) (NEEDLE) ×1 IMPLANT
NEEDLE 18GX1X1/2 (RX/OR ONLY) (NEEDLE) IMPLANT
PACK ARTHROSCOPY DSU (CUSTOM PROCEDURE TRAY) ×2 IMPLANT
PAD ARMBOARD 7.5X6 YLW CONV (MISCELLANEOUS) ×4 IMPLANT
PENCIL BUTTON HOLSTER BLD 10FT (ELECTRODE) IMPLANT
SET ARTHROSCOPY TUBING (MISCELLANEOUS) ×2
SET ARTHROSCOPY TUBING LN (MISCELLANEOUS) ×1 IMPLANT
SPONGE GAUZE 4X4 12PLY (GAUZE/BANDAGES/DRESSINGS) ×2 IMPLANT
SPONGE LAP 4X18 X RAY DECT (DISPOSABLE) ×2 IMPLANT
SUT ETHILON 4 0 PS 2 18 (SUTURE) ×2 IMPLANT
SYR 30ML LL (SYRINGE) ×3 IMPLANT
SYR CONTROL 10ML LL (SYRINGE) ×1 IMPLANT
TOWEL OR 17X24 6PK STRL BLUE (TOWEL DISPOSABLE) ×2 IMPLANT
TOWEL OR 17X26 10 PK STRL BLUE (TOWEL DISPOSABLE) ×2 IMPLANT
TUBE CONNECTING 12X1/4 (SUCTIONS) ×2 IMPLANT
WAND 90 DEG TURBOVAC W/CORD (SURGICAL WAND) ×1 IMPLANT
WATER STERILE IRR 1000ML POUR (IV SOLUTION) ×2 IMPLANT

## 2012-09-01 NOTE — Preoperative (Signed)
Beta Blockers   Reason not to administer Beta Blockers:Not Applicable 

## 2012-09-01 NOTE — Anesthesia Postprocedure Evaluation (Signed)
  Anesthesia Post-op Note  Patient: Amy Barnes  Procedure(s) Performed: Procedure(s) (LRB) with comments: ARTHROSCOPY KNEE (Right)  Patient Location: PACU  Anesthesia Type:General  Level of Consciousness: awake, alert  and oriented  Airway and Oxygen Therapy: Patient Spontanous Breathing and Patient connected to nasal cannula oxygen  Post-op Pain: mild  Post-op Assessment: Post-op Vital signs reviewed and Patient's Cardiovascular Status Stable  Post-op Vital Signs: stable  Complications: No apparent anesthesia complications

## 2012-09-01 NOTE — Op Note (Signed)
Dictation number:  [id number]

## 2012-09-01 NOTE — Op Note (Signed)
NAMEAMYIA, Amy Barnes                 ACCOUNT NO.:  [id number]  MEDICAL RECORD NO.:  [id number]  LOCATION:  5N02C                        FACILITY:  [facility]  PHYSICIAN:  Mila Homer. Sherlean Foot, M.D. DATE OF BIRTH:  27-Nov-1950  DATE OF PROCEDURE:  09/01/2012 DATE OF DISCHARGE:                              OPERATIVE REPORT   SURGEON:  Mila Homer. Sherlean Foot, M.D.  ASSISTANT:  Altamese Cabal, PA-C  ANESTHESIA:  General.  PREOPERATIVE DIAGNOSIS:  Right knee meniscal tears.  POSTOPERATIVE DIAGNOSIS:  Right knee meniscal tears.  OPERATIVE PROCEDURE:  Right knee arthroscopy with partial medial and lateral meniscectomy.  INDICATION FOR PROCEDURE:  The patient is a 61 year old [name] who was admitted for pain control and pain out of proportion to the diagnosis of meniscal tearing.  After being worked up, cleared by her neurosurgeon that it was not her back, I was asked to do a diagnostic arthroscopy and take care of the meniscus tears in hopes that this would alleviate some of her pain.  Informed consent was obtained.  DESCRIPTION OF PROCEDURE:  The patient was laid supine and administered general anesthesia.  The right leg was prepped and draped in usual sterile fashion.  Inferolateral and inferomedial portals were created with #11 blade, blunt trocar and cannula.  Diagnostic arthroscopy revealed minimal chondromalacia in the patellofemoral joint, grade 2-3 the most in the trochlea.  Chondroplasty was performed.  There was lots of synovitis, which was debrided as well.  Went to the notch.  Again, lots of synovitis, which was debrided and cauterized with the ArthroCare debridement wand.  Medial compartment did have some large anterior horn tear and posterior horn meniscal root tear.  Partial meniscectomy was carried out with straight basket forceps and great Reveron shaver. Lateral compartment showed degenerative lateral meniscal tearing, which was relatively significant, was turned out with a  straight basket forceps and great Ellefson shaver.  Took one from all three compartments of the knee to ensure all loose bodies and debris was removed.  There was no further pathology.  I then lavaged, closed with 4-0 nylon sutures. Dressed with Xeroform, dressing sponges, sterile Webril, and Ace wrap.  COMPLICATIONS:  None.  DRAINS:  None.          ______________________________ Mila Homer. Sherlean Foot, M.D.     SDL/MEDQ  D:  09/01/2012  T:  09/01/2012  Job:  [id number]

## 2012-09-01 NOTE — H&P (Signed)
[facility] PROGRESS NOTE  Amy Barnes WUJ:[id number] DOB: 1951-06-19 DOA: 08/28/2012 PCP: [name], MD  Assessment/Plan: Principal Problem:  *Right anterior knee pain Active Problems:  HYPOTHYROIDISM  DM type 2 with diabetic peripheral neuropathy  ANEMIA-NOS  BIPOLAR AFFECTIVE DISORDER, DEPRESSED  HYPERTENSION  ASTHMA  GERD  Obesity, morbid (more than 100 lbs over ideal weight or BMI > 40)    Assessment & Plan   61 year old morbidly obese Caucasian female admitted 2 days ago for acute right knee pain due to medial and lateral meniscal tear, she is unable to stand up or bear weight on it, orthopedics planning for arthroscopic surgical intervention on 09/01/2012.    1. Right knee pain, unable to bear weight on the right leg, in a patient with morbid obesity and MRI evidence of both lateral and medial meniscal tear along with joint effusion - also has been consulted, continue pain control, further recommendations per orthopedics, for now patient is unable to stand up or bear weight at all on the right leg, She has been seen by Dr. Valentina Gu on 08-2012 who is considering surgical intervention on 09/01/2012 of note patient is on Lovenox for DVT prophylaxis however it is weight-based, pain meds have been adjusted she feels better. Also seen by Dr. Lovell Sheehan neurosurgeon as she has history of L-spine problem, L-spine MRI and Dr. Lovell Sheehan evaluation not consistent with any ongoing L-spine issues. Placed orders for nursing staff give tomorrow's Lovenox dose after discussing with orthopedics.    2. Diabetes mellitus type 2 morbid obesity - continue pre-meal NovoLog along with sliding scale insulin, will monitor sugars if needed long-acting insulin will be added.  CBG (last 3)   Basename  08/31/12 0742  08/30/12 2236  08/30/12 1638   GLUCAP  95  116*  93    3. History of hypertension. Continue ARB, monitor blood pressure.  4. Hypothyroidism. Continue home dose Synthroid.  5. Dyslipidemia  continue home dose statin.  6. Morbid obesity. Outpatient follow with PCP and dietician.    Code Status: full  Family Communication: patient and husband  Disposition Plan: TBD      Brief narrative: As above  Consultants: Cristi Loron, MD Hurman Horn, MD   Procedures:  None  Antibiotics:  Levofloxacin  HPI/Subjective: No chest pain or shortness of breath   Objective: Filed Vitals:   08/31/12 0606 08/31/12 1500 08/31/12 2126 09/01/12 0700  BP: 118/57 166/61 114/49 112/46  Pulse: 86 103 94 73  Temp: 98.8 F (37.1 C) 99.1 F (37.3 C) 97.9 F (36.6 C) 98 F (36.7 C)  TempSrc:   Oral Oral  Resp: 16 18 16    Height:      Weight:      SpO2: 98% 96% 100% 100%    Intake/Output Summary (Last 24 hours) at 09/01/12 0752 Last data filed at 08/31/12 2300  Gross per 24 hour  Intake    240 ml  Output    800 ml  Net   -560 ml    Exam: Awake Alert, Oriented X 3, No new F.N deficits, Normal affect  [place].AT,PERRAL  Supple Neck,No JVD, No cervical lymphadenopathy appriciated.  Symmetrical Chest wall movement, Good air movement bilaterally, CTAB  RRR,No Gallops,Rubs or new Murmurs, No Parasternal Heave  +ve B.Sounds, Abd Soft, Non tender, No organomegaly appriciated, No rebound - guarding or rigidity.  No Cyanosis, Clubbing or edema, No new Rash or bruise  R knee swollen, tender to palpate, no warmth or erythema    Data  Reviewed: Basic Metabolic Panel:  Lab 09/01/12 1610 08/28/12 2336  NA 131* 135  K 4.5 3.7  CL 94* 97  CO2 30 28  GLUCOSE 107* 120*  BUN 13 15  CREATININE 0.65 0.64  CALCIUM 9.5 9.1  MG -- --  PHOS -- --    Liver Function Tests: No results found for this basename: AST:5,ALT:5,ALKPHOS:5,BILITOT:5,PROT:5,ALBUMIN:5 in the last 168 hours No results found for this basename: LIPASE:5,AMYLASE:5 in the last 168 hours No results found for this basename: AMMONIA:5 in the last 168 hours  CBC:  Lab 09/01/12 0610 08/28/12 2336  WBC 7.6 9.0   NEUTROABS -- 6.3  HGB 8.7* 9.7*  HCT 27.0* 29.9*  MCV 82.6 82.4  PLT 377 387    Cardiac Enzymes: No results found for this basename: CKTOTAL:5,CKMB:5,CKMBINDEX:5,TROPONINI:5 in the last 168 hours BNP (last 3 results) No results found for this basename: PROBNP:3 in the last 8760 hours   CBG:  Lab 09/01/12 0728 08/31/12 2138 08/31/12 1556 08/31/12 1111 08/31/12 0742  GLUCAP 91 95 82 98 95    Recent Results (from the past 240 hour(s))  SURGICAL PCR SCREEN     Status: Abnormal   Collection Time   08/29/12  5:43 AM      Component Value Range Status Comment   MRSA, PCR POSITIVE (*) NEGATIVE Final    Staphylococcus aureus POSITIVE (*) NEGATIVE Final   URINE CULTURE     Status: Normal   Collection Time   08/30/12  3:11 PM      Component Value Range Status Comment   Specimen Description URINE, CATHETERIZED   Final    Special Requests NONE   Final    Culture  Setup Time 08/30/2012 15:34   Final    Colony Count >=100,000 COLONIES/ML   Final    Culture KLEBSIELLA PNEUMONIAE   Final    Report Status 09/01/2012 FINAL   Final    Organism ID, Bacteria KLEBSIELLA PNEUMONIAE   Final      Studies: Mr Lumbar Spine W Wo Contrast  08/31/2012  *RADIOLOGY REPORT*  Clinical Data: Severe right leg and knee pain.  Lumbar spine surgery 01/08/2012.  MRI LUMBAR SPINE WITHOUT AND WITH CONTRAST  Technique:  Multiplanar and multiecho pulse sequences of the lumbar spine were obtained without and with intravenous contrast.  Contrast: 20mL MULTIHANCE GADOBENATE DIMEGLUMINE 529 MG/ML IV SOLN  Comparison:  None available.  Findings:  Normal signal is present in the conus medullaris which terminates at L1-2.  Patient is status post L3-4 PLIF.  The marrow signal, vertebral body heights, and alignment are otherwise normal. Limited imaging of the abdomen is unremarkable.  L1-2:  Negative.  L2-3:  Mild facet hypertrophy is present.  There is slight disc bulging.  No significant stenosis is present.  L3-4:  The patient  is status post fusion.  Bilateral foraminotomies were performed.  The central canal and foramina are widely patent. The disc spacers are well positioned.  L4-5:  A mild broad-based disc herniation is present.  Facet hypertrophy is noted bilaterally.  This leads to mild distortion of the central canal without significant stenosis.  L5-S1:  A rightward disc herniation potentially contacts the exiting L5 nerve root within the foramen.  IMPRESSION:  1.  Status post L3-4 PLIF without evidence for residual or recurrent stenosis. 2.  Rightward disc herniation at L5-S1 potentially contacts the exiting right L5 nerve root. 3.  Slight disc bulging at to L2-3 and L4-5 without significant stenosis at either level.  Original Report Authenticated By: Marin Roberts, M.D.    Dg Chest Port 1 View  08/30/2012  *RADIOLOGY REPORT*  Clinical Data: Cough and weakness.  PORTABLE CHEST - 1 VIEW  Comparison: 01/08/2012 and 01/02/2012.  Findings: Central pulmonary vascular prominence without pulmonary edema.  No segmental infiltrate or gross pneumothorax.  Calcified slightly tortuous aorta.  Heart size top normal.  Postsurgical changes lower cervical spine.  IMPRESSION: No acute abnormality.  If there is a persistent unexplained cough, follow-up two-view exam may be considered.   Original Report Authenticated By: Lacy Duverney, M.D.    Dg Knee Right Port  08/29/2012  *RADIOLOGY REPORT*  Clinical Data: Severe right knee pain.  PORTABLE RIGHT KNEE - 1-2 VIEW  Comparison: None.  Findings: There is marked narrowing of the medial compartment with small marginal osteophytes. Slight sclerosis of the medial compartment.  Lateral and patellofemoral compartments appear essentially normal.  No appreciable joint effusion.  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   Original Report Authenticated By: Francene Boyers, M.D.     Scheduled Meds:   . aspirin EC  81 mg Oral Daily  . Chlorhexidine Gluconate  Cloth  6 each Topical Q0600  . docusate sodium  300 mg Oral QHS  . enoxaparin (LOVENOX) injection  80 mg Subcutaneous Daily  .  HYDROmorphone (DILAUDID) injection  1 mg Intravenous Once  . insulin aspart  0-15 Units Subcutaneous TID WC  . insulin aspart  0-5 Units Subcutaneous QHS  . insulin aspart  4 Units Subcutaneous TID WC  . ketorolac  30 mg Intravenous Q6H  . lamoTRIgine  200 mg Oral Daily  . levofloxacin (LEVAQUIN) IV  500 mg Intravenous Q24H  . levothyroxine  150 mcg Oral QAC breakfast  . Liraglutide  1.8 mg Subcutaneous Q24H  . LORazepam  0.5 mg Intravenous Once  . losartan  50 mg Oral Daily  . montelukast  10 mg Oral QHS  . morphine  30 mg Oral Q12H  . mupirocin ointment  1 application Nasal BID  . Oxcarbazepine  300 mg Oral Daily  . OXcarbazepine  600 mg Oral QHS  . simvastatin  40 mg Oral QPM  . sodium chloride  3 mL Intravenous Q12H   Continuous Infusions:   Principal Problem:  *Right anterior knee pain Active Problems:  HYPOTHYROIDISM  DM type 2 with diabetic peripheral neuropathy  ANEMIA-NOS  BIPOLAR AFFECTIVE DISORDER, DEPRESSED  HYPERTENSION  ASTHMA  GERD  Obesity, morbid (more than 100 lbs over ideal weight or BMI > 40)    Time spent: 40 minutes   [facility]  [facility] Pager [phone]. If 8PM-8AM, please contact night-coverage at [email or web address], password [facility] 09/01/2012, 7:52 AM  LOS: 4 days

## 2012-09-01 NOTE — Transfer of Care (Signed)
Immediate Anesthesia Transfer of Care Note  Patient: Amy Barnes  Procedure(s) Performed: Procedure(s) (LRB) with comments: ARTHROSCOPY KNEE (Right)  Patient Location: PACU  Anesthesia Type:General  Level of Consciousness: awake, patient cooperative and responds to stimulation  Airway & Oxygen Therapy: Patient Spontanous Breathing and Patient connected to nasal cannula oxygen  Post-op Assessment: Report given to PACU RN and Post -op Vital signs reviewed and stable  Post vital signs: Reviewed and stable  Complications: No apparent anesthesia complications

## 2012-09-01 NOTE — Anesthesia Preprocedure Evaluation (Addendum)
Anesthesia Evaluation  Patient identified by MRN, date of birth, ID band Patient awake    Reviewed: Allergy & Precautions, H&P , NPO status , Patient's Chart, lab work & pertinent test results  Airway Mallampati: II TM Distance: >3 FB Neck ROM: Full    Dental  (+) Teeth Intact and Dental Advisory Given   Pulmonary shortness of breath, asthma , sleep apnea and Continuous Positive Airway Pressure Ventilation ,  breath sounds clear to auscultation        Cardiovascular hypertension, Pt. on medications Rhythm:Regular Rate:Normal     Neuro/Psych    GI/Hepatic Neg liver ROS, GERD-  ,  Endo/Other  diabetes, Well Controlled, Oral Hypoglycemic AgentsHypothyroidism   Renal/GU negative Renal ROS     Musculoskeletal   Abdominal (+) + obese,   Peds  Hematology negative hematology ROS (+)   Anesthesia Other Findings   Reproductive/Obstetrics                          Anesthesia Physical Anesthesia Plan  ASA: III  Anesthesia Plan: General   Post-op Pain Management:    Induction: Intravenous  Airway Management Planned: Oral ETT  Additional Equipment:   Intra-op Plan:   Post-operative Plan: Extubation in OR  Informed Consent: I have reviewed the patients History and Physical, chart, labs and discussed the procedure including the risks, benefits and alternatives for the proposed anesthesia with the patient or authorized representative who has indicated his/her understanding and acceptance.   Dental advisory given  Plan Discussed with: Anesthesiologist, Surgeon and CRNA  Anesthesia Plan Comments: (1. right meniscal tear 2. bipolar disorder 3 diabetes mellitus type 2 4 hypertension 5 anemia 6 asthma 7 reflux disease 8 morbid obesity   Plan GA with oral ETT  Kipp Brood, MD)      Anesthesia Quick Evaluation

## 2012-09-01 NOTE — Progress Notes (Addendum)
Amy Overlie, MD Physician Signed Internal Medicine H&P 09/01/2012 7:52 AM  [facility] PROGRESS NOTE   Amy Barnes JXB:[id number] DOB: Apr 22, 1951 DOA: 08/28/2012 PCP: [name], MD   Assessment/Plan: Principal Problem:  *Right anterior knee pain Active Problems:  HYPOTHYROIDISM  DM type 2 with diabetic peripheral neuropathy  ANEMIA-NOS  BIPOLAR AFFECTIVE DISORDER, DEPRESSED  HYPERTENSION  ASTHMA  GERD  Obesity, morbid (more than 100 lbs over ideal weight or BMI > 40)      Assessment & Plan     61 year old morbidly obese Caucasian female admitted 2 days ago for acute right knee pain due to medial and lateral meniscal tear, she is unable to stand up or bear weight on it, orthopedics planning for arthroscopic surgical intervention on 09/01/2012.      1. Right knee pain, unable to bear weight on the right leg, in a patient with morbid obesity and MRI evidence of both lateral and medial meniscal tear along with joint effusion - also has been consulted, continue pain control, further recommendations per orthopedics, for now patient is unable to stand up or bear weight at all on the right leg, She has been seen by Dr. Valentina Gu on 08-2012 who is considering surgical intervention on 09/01/2012 of note patient is on Lovenox for DVT prophylaxis however it is weight-based, pain meds have been adjusted she feels better. Also seen by Dr. Lovell Sheehan neurosurgeon as she has history of L-spine problem, L-spine MRI and Dr. Lovell Sheehan evaluation not consistent with any ongoing L-spine issues. Placed orders for nursing staff give tomorrow's Lovenox dose after discussing with orthopedics.      2. Diabetes mellitus type 2 morbid obesity - continue pre-meal NovoLog along with sliding scale insulin, will monitor sugars if needed long-acting insulin will be added.   CBG (last 3)      Basename   08/31/12 0742   08/30/12 2236   08/30/12 1638    GLUCAP   95   116*   93       3. History of hypertension.  Continue ARB, monitor blood pressure.   4. Hypothyroidism. Continue home dose Synthroid.   5. Dyslipidemia continue home dose statin.   6. Morbid obesity. Outpatient follow with PCP and dietician.  7. UTI started on levofloxacin await urine culture     Code Status: full   Family Communication: patient and husband   Disposition Plan: TBD          Brief narrative: As above   Consultants: Cristi Loron, MD Hurman Horn, MD     Procedures: None   Antibiotics: Levofloxacin   HPI/Subjective: No chest pain or shortness of breath     Objective: Filed Vitals:     08/31/12 0606  08/31/12 1500  08/31/12 2126  09/01/12 0700   BP:  118/57  166/61  114/49  112/46   Pulse:  86  103  94  73   Temp:  98.8 F (37.1 C)  99.1 F (37.3 C)  97.9 F (36.6 C)  98 F (36.7 C)   TempSrc:      Oral  Oral   Resp:  16  18  16      Height:           Weight:           SpO2:  98%  96%  100%  100%    Intake/Output Summary (Last 24 hours) at 09/01/12 0752 Last data filed at 08/31/12 2300   Gross per 24 hour  Intake     240 ml   Output     800 ml   Net    -560 ml    Exam: Awake Alert, Oriented X 3, No new F.N deficits, Normal affect   [place].AT,PERRAL   Supple Neck,No JVD, No cervical lymphadenopathy appriciated.   Symmetrical Chest wall movement, Good air movement bilaterally, CTAB   RRR,No Gallops,Rubs or new Murmurs, No Parasternal Heave   +ve B.Sounds, Abd Soft, Non tender, No organomegaly appriciated, No rebound - guarding or rigidity.   No Cyanosis, Clubbing or edema, No new Rash or bruise   R knee swollen, tender to palpate, no warmth or erythema      Data Reviewed: Basic Metabolic Panel: Lab  09/01/12 1610  08/28/12 2336   NA  131*  135   K  4.5  3.7   CL  94*  97   CO2  30  28   GLUCOSE  107*  120*   BUN  13  15   CREATININE  0.65  0.64   CALCIUM  9.5  9.1   MG  --  --   PHOS  --  --      Liver Function Tests: No results found for this  basename: AST:5,ALT:5,ALKPHOS:5,BILITOT:5,PROT:5,ALBUMIN:5 in the last 168 hours No results found for this basename: LIPASE:5,AMYLASE:5 in the last 168 hours No results found for this basename: AMMONIA:5 in the last 168 hours   CBC: Lab  09/01/12 0610  08/28/12 2336   WBC  7.6  9.0   NEUTROABS  --  6.3   HGB  8.7*  9.7*   HCT  27.0*  29.9*   MCV  82.6  82.4   PLT  377  387      Cardiac Enzymes: No results found for this basename: CKTOTAL:5,CKMB:5,CKMBINDEX:5,TROPONINI:5 in the last 168 hours BNP (last 3 results) No results found for this basename: PROBNP:3 in the last 8760 hours     CBG: Lab  09/01/12 0728  08/31/12 2138  08/31/12 1556  08/31/12 1111  08/31/12 0742   GLUCAP  91  95  82  98  95       Recent Results (from the past 240 hour(s))   SURGICAL PCR SCREEN     Status: Abnormal     Collection Time     08/29/12  5:43 AM       Component  Value  Range  Status  Comment     MRSA, PCR  POSITIVE (*)  NEGATIVE  Final       Staphylococcus aureus  POSITIVE (*)  NEGATIVE  Final     URINE CULTURE     Status: Normal     Collection Time     08/30/12  3:11 PM       Component  Value  Range  Status  Comment     Specimen Description  URINE, CATHETERIZED     Final       Special Requests  NONE     Final       Culture  Setup Time  08/30/2012 15:34     Final       Colony Count  >=100,000 COLONIES/ML     Final       Culture  KLEBSIELLA PNEUMONIAE     Final       Report Status  09/01/2012 FINAL     Final       Organism ID, Bacteria  KLEBSIELLA PNEUMONIAE     Final  Studies: Mr Lumbar Spine W Wo Contrast   08/31/2012  *RADIOLOGY REPORT*  Clinical Data: Severe right leg and knee pain.  Lumbar spine surgery 01/08/2012.  MRI LUMBAR SPINE WITHOUT AND WITH CONTRAST  Technique:  Multiplanar and multiecho pulse sequences of the lumbar spine were obtained without and with intravenous contrast.  Contrast: 20mL MULTIHANCE GADOBENATE DIMEGLUMINE 529 MG/ML IV SOLN  Comparison:  None  available.  Findings:  Normal signal is present in the conus medullaris which terminates at L1-2.  Patient is status post L3-4 PLIF.  The marrow signal, vertebral body heights, and alignment are otherwise normal. Limited imaging of the abdomen is unremarkable.  L1-2:  Negative.  L2-3:  Mild facet hypertrophy is present.  There is slight disc bulging.  No significant stenosis is present.  L3-4:  The patient is status post fusion.  Bilateral foraminotomies were performed.  The central canal and foramina are widely patent. The disc spacers are well positioned.  L4-5:  A mild broad-based disc herniation is present.  Facet hypertrophy is noted bilaterally.  This leads to mild distortion of the central canal without significant stenosis.  L5-S1:  A rightward disc herniation potentially contacts the exiting L5 nerve root within the foramen.  IMPRESSION:  1.  Status post L3-4 PLIF without evidence for residual or recurrent stenosis. 2.  Rightward disc herniation at L5-S1 potentially contacts the exiting right L5 nerve root. 3.  Slight disc bulging at to L2-3 and L4-5 without significant stenosis at either level.   Original Report Authenticated By: Marin Roberts, M.D.     Dg Chest Port 1 View   08/30/2012  *RADIOLOGY REPORT*  Clinical Data: Cough and weakness.  PORTABLE CHEST - 1 VIEW  Comparison: 01/08/2012 and 01/02/2012.  Findings: Central pulmonary vascular prominence without pulmonary edema.  No segmental infiltrate or gross pneumothorax.  Calcified slightly tortuous aorta.  Heart size top normal.  Postsurgical changes lower cervical spine.  IMPRESSION: No acute abnormality.  If there is a persistent unexplained cough, follow-up two-view exam may be considered.   Original Report Authenticated By: Lacy Duverney, M.D.     Dg Knee Right Port   08/29/2012  *RADIOLOGY REPORT*  Clinical Data: Severe right knee pain.  PORTABLE RIGHT KNEE - 1-2 VIEW  Comparison: None.  Findings: There is marked narrowing of the  medial compartment with small marginal osteophytes. Slight sclerosis of the medial compartment.  Lateral and patellofemoral compartments appear essentially normal.  No appreciable joint effusion.  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   Original Report Authenticated By: Francene Boyers, M.D.        Scheduled Meds:     .  aspirin EC   81 mg  Oral  Daily   .  Chlorhexidine Gluconate Cloth   6 each  Topical  Q0600   .  docusate sodium   300 mg  Oral  QHS   .  enoxaparin (LOVENOX) injection   80 mg  Subcutaneous  Daily   .   HYDROmorphone (DILAUDID) injection   1 mg  Intravenous  Once   .  insulin aspart   0-15 Units  Subcutaneous  TID WC   .  insulin aspart   0-5 Units  Subcutaneous  QHS   .  insulin aspart   4 Units  Subcutaneous  TID WC   .  ketorolac   30 mg  Intravenous  Q6H   .  lamoTRIgine   200 mg  Oral  Daily   .  levofloxacin (LEVAQUIN) IV   500 mg  Intravenous  Q24H   .  levothyroxine   150 mcg  Oral  QAC breakfast   .  Liraglutide   1.8 mg  Subcutaneous  Q24H   .  LORazepam   0.5 mg  Intravenous  Once   .  losartan   50 mg  Oral  Daily   .  montelukast   10 mg  Oral  QHS   .  morphine   30 mg  Oral  Q12H   .  mupirocin ointment   1 application  Nasal  BID   .  Oxcarbazepine   300 mg  Oral  Daily   .  OXcarbazepine   600 mg  Oral  QHS   .  simvastatin   40 mg  Oral  QPM   .  sodium chloride   3 mL  Intravenous  Q12H    Continuous Infusions:    Principal Problem:  *Right anterior knee pain Active Problems:  HYPOTHYROIDISM  DM type 2 with diabetic peripheral neuropathy  ANEMIA-NOS  BIPOLAR AFFECTIVE DISORDER, DEPRESSED  HYPERTENSION  ASTHMA  GERD  Obesity, morbid (more than 100 lbs over ideal weight or BMI > 40)     Time spent: 40 minutes     [facility]          [facility] Pager [phone]. If 8PM-8AM, please contact night-coverage at [email or web address], password [facility] 09/01/2012, 7:52 AM LOS: 4 days

## 2012-09-02 ENCOUNTER — Encounter (HOSPITAL_COMMUNITY): Payer: Self-pay | Admitting: Orthopedic Surgery

## 2012-09-02 LAB — CBC
HCT: 27.4 % — ABNORMAL LOW (ref 36.0–46.0)
Hemoglobin: 9 g/dL — ABNORMAL LOW (ref 12.0–15.0)
MCH: 27.2 pg (ref 26.0–34.0)
MCHC: 32.8 g/dL (ref 30.0–36.0)
MCV: 82.8 fL (ref 78.0–100.0)
Platelets: 395 10*3/uL (ref 150–400)
RBC: 3.31 MIL/uL — ABNORMAL LOW (ref 3.87–5.11)
RDW: 14.4 % (ref 11.5–15.5)
WBC: 7.7 10*3/uL (ref 4.0–10.5)

## 2012-09-02 LAB — BASIC METABOLIC PANEL
BUN: 10 mg/dL (ref 6–23)
CO2: 29 mEq/L (ref 19–32)
Calcium: 8.5 mg/dL (ref 8.4–10.5)
Chloride: 92 mEq/L — ABNORMAL LOW (ref 96–112)
Creatinine, Ser: 0.7 mg/dL (ref 0.50–1.10)
GFR calc Af Amer: >90 mL/min (ref >90)
GFR calc non Af Amer: >90 mL/min (ref >90)
Glucose, Bld: 138 mg/dL — ABNORMAL HIGH (ref 70–99)
Potassium: 4.3 mEq/L (ref 3.5–5.1)
Sodium: 128 mEq/L — ABNORMAL LOW (ref 135–145)

## 2012-09-02 LAB — GLUCOSE, CAPILLARY
Glucose-Capillary: 122 mg/dL — ABNORMAL HIGH (ref 70–99)
Glucose-Capillary: 87 mg/dL (ref 70–99)
Glucose-Capillary: 93 mg/dL (ref 70–99)

## 2012-09-02 MED ORDER — CYCLOBENZAPRINE HCL 10 MG PO TABS
5.0000 mg | ORAL_TABLET | Freq: Four times a day (QID) | ORAL | Status: DC | PRN
  Administered 2012-09-02: 5 mg via ORAL
  Filled 2012-09-02: qty 1

## 2012-09-02 MED ORDER — OXYCODONE-ACETAMINOPHEN 5-325 MG PO TABS
1.0000 | ORAL_TABLET | ORAL | Status: DC | PRN
  Administered 2012-09-02 – 2012-09-09 (×24): 1 via ORAL
  Filled 2012-09-02 (×25): qty 1

## 2012-09-02 MED ORDER — CYCLOBENZAPRINE HCL 10 MG PO TABS
10.0000 mg | ORAL_TABLET | Freq: Four times a day (QID) | ORAL | Status: DC | PRN
  Administered 2012-09-03 – 2012-09-11 (×23): 10 mg via ORAL
  Filled 2012-09-02 (×11): qty 1
  Filled 2012-09-02: qty 6
  Filled 2012-09-02 (×11): qty 1

## 2012-09-02 MED ORDER — ALPRAZOLAM 0.25 MG PO TABS
0.2500 mg | ORAL_TABLET | Freq: Two times a day (BID) | ORAL | Status: DC | PRN
  Administered 2012-09-02 – 2012-09-10 (×12): 0.25 mg via ORAL
  Filled 2012-09-02 (×12): qty 1

## 2012-09-02 NOTE — Progress Notes (Signed)
CARE MANAGEMENT NOTE 09/02/2012  Patient:  Amy Barnes, Amy Barnes   Account Number:  [id number]  Date Initiated:  09/01/2012  Documentation initiated by:  Anette Guarneri  Subjective/Objective Assessment:   right meniscal tear  from home     Action/Plan:   home with  [facility].  09/02/12 CM spoke with patient concerning home health and DME needs, choice offered. Patient will need Bariatric 3in1.   Anticipated DC Date:  09/02/2012   Anticipated DC Plan:  HOME W HOME HEALTH SERVICES      DC Planning Services  CM consult      PAC Choice  DURABLE MEDICAL EQUIPMENT  HOME HEALTH   Choice offered to / List presented to:  C-1 Patient   DME arranged  3-N-1  WALKER - WIDE      DME agency  [facility]     HH arranged  HH-2 PT      [facility] agency  [facility]   Status of service:  Completed, signed off Medicare Important Message given?   (If response is "NO", the following Medicare IM given date fields will be blank) Date Medicare IM given:   Date Additional Medicare IM given:    Discharge Disposition:  HOME W HOME HEALTH SERVICES  Per UR Regulation:    If discussed at Long Length of Stay Meetings, dates discussed:    Comments:

## 2012-09-02 NOTE — Addendum Note (Signed)
Addendum  created 09/02/12 1520 by Tesean Stump Todd Srihitha Tagliaferri, CRNA   Modules edited:Anesthesia Medication Administration    

## 2012-09-02 NOTE — Progress Notes (Signed)
PT is not recommending placement at SNF. Clinical Social Worker will sign off for now as social work intervention is no longer needed. Please consult [place] again if new need arises.   Sabino Niemann, MSW, [employer] [phone]

## 2012-09-02 NOTE — Evaluation (Signed)
Physical Therapy Evaluation Patient Details Name: Amy Barnes MRN: [id number] DOB: 11/08/1950 Today's Date: 09/02/2012 Time: 4098-1191 PT Time Calculation (min): 46 min  PT Assessment / Plan / Recommendation Clinical Impression  Pt is a 61 y/o female admitted s/p right mensical tear repair along with the below PT problem list. Pt would benefit from acute PT to maximize independence and facilitate d/c home with HHPT.    PT Assessment  Patient needs continued PT services    Follow Up Recommendations  Home health PT    Does the patient have the potential to tolerate intense rehabilitation      Barriers to Discharge None      Equipment Recommendations  Rolling walker with 5" wheels;3 in 1 bedside comode    Recommendations for Other Services     Frequency Min 5X/week    Precautions / Restrictions Precautions Precautions: Fall Restrictions Weight Bearing Restrictions: Yes RLE Weight Bearing: Weight bearing as tolerated   Pertinent Vitals/Pain None at rest. Not rated with mobility, but 4 on faces scale due to right knee. Pt repositioned and ice applied. RN made aware as well.      Mobility  Bed Mobility Bed Mobility: Supine to Sit Supine to Sit: 4: Min assist;HOB flat Details for Bed Mobility Assistance: Assist for trunk with pt able to mobilize right LE to EOB without assist. Cues for sequence. Transfers Transfers: Sit to Stand;Stand to Sit (2 trials.) Sit to Stand: 4: Min assist;With upper extremity assist;From bed;From toilet Stand to Sit: 4: Min assist;With upper extremity assist;To toilet;To chair/3-in-1 Details for Transfer Assistance: Assist for balance with cues for safest hand/right LE placement. Ambulation/Gait Ambulation/Gait Assistance: 4: Min assist Ambulation Distance (Feet): 40 Feet (20 feet x 2 trials.) Assistive device: Rolling walker Ambulation/Gait Assistance Details: Assist to off weight right LE due to discomfort and for balance. Cues for safest  sequence with RW. Gait Pattern: Step-to pattern;Decreased step length - right;Decreased stance time - right;Narrow base of support Stairs: No Wheelchair Mobility Wheelchair Mobility: No    Shoulder Instructions     Exercises General Exercises - Lower Extremity Ankle Circles/Pumps: AROM;Right;10 reps;Supine Quad Sets: AROM;Right;10 reps;Supine Heel Slides: AAROM;Right;10 reps;Supine   PT Diagnosis: Difficulty walking;Acute pain  PT Problem List: Decreased strength;Decreased range of motion;Decreased activity tolerance;Decreased balance;Decreased mobility;Decreased knowledge of use of DME;Decreased knowledge of precautions;Pain PT Treatment Interventions: DME instruction;Gait training;Stair training;Functional mobility training;Therapeutic activities;Therapeutic exercise;Balance training;Patient/family education   PT Goals Acute Rehab PT Goals PT Goal Formulation: With patient/family Time For Goal Achievement: 09/09/12 Potential to Achieve Goals: Good Pt will go Supine/Side to Sit: with modified independence PT Goal: Supine/Side to Sit - Progress: Goal set today Pt will go Sit to Supine/Side: with modified independence PT Goal: Sit to Supine/Side - Progress: Goal set today Pt will go Sit to Stand: with modified independence PT Goal: Sit to Stand - Progress: Goal set today Pt will go Stand to Sit: with modified independence PT Goal: Stand to Sit - Progress: Goal set today Pt will Ambulate: >150 feet;with modified independence;with least restrictive assistive device PT Goal: Ambulate - Progress: Goal set today Pt will Go Up / Down Stairs: 1-2 stairs;with modified independence;with least restrictive assistive device PT Goal: Up/Down Stairs - Progress: Goal set today Pt will Perform Home Exercise Program: Independently PT Goal: Perform Home Exercise Program - Progress: Goal set today  Visit Information  Last PT Received On: 09/02/12 Assistance Needed: +1    Subjective Data   Subjective: "It hurts when I bend it." Patient  Stated Goal: Get well. Hopefully go home.   Prior Functioning  Home Living Lives With: Daughter Available Help at Discharge: Family;Available PRN/intermittently Type of Home: House Home Access: Stairs to enter;Ramped entrance (Ramp in back. Stairs in front.) Entrance Stairs-Number of Steps: 4 Entrance Stairs-Rails: None Home Layout: Two level;Able to live on main level with bedroom/bathroom Bathroom Shower/Tub: Walk-in shower;Curtain Bathroom Toilet: Handicapped height Home Adaptive Equipment: Walker - rolling;Straight cane;Wheelchair - manual;Crutches Prior Function Level of Independence: Independent with assistive device(s) (Uses cane.) Able to Take Stairs?: Yes Driving: Yes Communication Communication: No difficulties Dominant Hand: Right    Cognition  Overall Cognitive Status: Appears within functional limits for tasks assessed/performed Arousal/Alertness: Awake/alert Orientation Level: Appears intact for tasks assessed Behavior During Session: [facility] for tasks performed    Extremity/Trunk Assessment Right Upper Extremity Assessment RUE ROM/Strength/Tone: Within functional levels RUE Sensation: WFL - Light Touch RUE Coordination: WFL - gross motor Left Upper Extremity Assessment LUE ROM/Strength/Tone: Within functional levels LUE Sensation: WFL - Light Touch LUE Coordination: WFL - gross motor Right Lower Extremity Assessment RLE ROM/Strength/Tone: Deficits;Due to pain RLE ROM/Strength/Tone Deficits: 2/5 throughout. AA/ROM right knee 0 to 30 degrees. RLE Sensation: WFL - Light Touch RLE Coordination: WFL - gross motor Left Lower Extremity Assessment LLE ROM/Strength/Tone: Within functional levels LLE Sensation: WFL - Light Touch LLE Coordination: WFL - gross motor Trunk Assessment Trunk Assessment: Normal   Balance Balance Balance Assessed: No  End of Session PT - End of Session Equipment Utilized During Treatment:  Gait belt Activity Tolerance: Patient tolerated treatment well Patient left: in chair;with call bell/phone within reach;with family/visitor present Nurse Communication: Mobility status  GP     Cephus Shelling 09/02/2012, 10:45 AM  09/02/2012 Cephus Shelling, PT, DPT [phone]

## 2012-09-02 NOTE — Progress Notes (Signed)
[facility] PROGRESS NOTE  Amy Barnes ZOX:[id number] DOB: 1951-07-04 DOA: 08/28/2012 PCP: [name], MD  Assessment/Plan: Principal Problem:  *Right anterior knee pain Active Problems:  HYPOTHYROIDISM  DM type 2 with diabetic peripheral neuropathy  ANEMIA-NOS  BIPOLAR AFFECTIVE DISORDER, DEPRESSED  HYPERTENSION  ASTHMA  GERD  Obesity, morbid (more than 100 lbs over ideal weight or BMI > 40)    Assessment & Plan   61 year old morbidly obese Caucasian female admitted 2 days ago for acute right knee pain due to medial and lateral meniscal tear, she is unable to stand up or bear weight on it, orthopedics planning for arthroscopic surgical intervention on 09/01/2012.    1. Right knee pain, status post, Right knee arthroscopy with partial medial and  lateral meniscectomy. unable to bear weight on the right leg, in a patient with morbid obesity and MRI evidence of both lateral and medial meniscal tear along with joint effusion - also has been consulted, continue pain control, further recommendations per orthopedics, for now patient is unable to stand up or bear weight at all on the right leg, She has been seen by Dr. Valentina Gu on 08-2012 who performed surgery on 09/01/2012 of note patient is on Lovenox for DVT prophylaxis however it is weight-based, pain meds have been adjusted she feels better. Also seen by Dr. Lovell Sheehan neurosurgeon as she has history of L-spine problem, L-spine MRI and Dr. Lovell Sheehan evaluation not consistent with any ongoing L-spine issues.  Lovenox for DVT prophylaxis   2. Diabetes mellitus type 2 morbid obesity - continue pre-meal NovoLog along with sliding scale insulin, will monitor sugars if needed long-acting insulin will be added.  CBG (last 3)   3. History of hypertension. Continue ARB, monitor blood pressure.  4. Hypothyroidism. Continue home dose Synthroid.  5. Dyslipidemia continue home dose statin.  6. Morbid obesity. Outpatient follow with PCP and  dietician.  7.anemia hemoglobin stable at around 9.0, continue Lovenox for DVT prophylaxis   Code Status: full  Family Communication: patient and husband  Disposition Plan: Social work consulted for possible placement     Brief narrative:  As above  Consultants:  Cristi Loron, MD  Hurman Horn, MD  Procedures:  None Antibiotics:  Levofloxacin HPI/Subjective:  No chest pain or shortness of breath   HPI/Subjective: . Hemodynamically stable overnight, has a central line in place  Objective: Filed Vitals:   09/02/12 0000 09/02/12 0400 09/02/12 0646 09/02/12 0652  BP:   135/56 134/58  Pulse:   92 90  Temp:   97.9 F (36.6 C) 97.6 F (36.4 C)  TempSrc:      Resp: 20 20 20 20   Height:      Weight:      SpO2: 96% 96% 98% 99%    Intake/Output Summary (Last 24 hours) at 09/02/12 0747 Last data filed at 09/01/12 1537  Gross per 24 hour  Intake   1150 ml  Output    400 ml  Net    750 ml    Exam:  HENT:  Head: Atraumatic.  Nose: Nose normal.  Mouth/Throat: Oropharynx is clear and moist.  Eyes: Conjunctivae are normal. Pupils are equal, round, and reactive to light. No scleral icterus.  Neck: Neck supple. No tracheal deviation present.  Cardiovascular: Normal rate, regular rhythm, normal heart sounds and intact distal pulses.  Pulmonary/Chest: Effort normal and breath sounds normal. No respiratory distress.  Abdominal: Soft. Normal appearance and bowel sounds are normal. She exhibits no distension. There is no  tenderness.  Musculoskeletal: She exhibits no edema and no tenderness.  Neurological: She is alert. No cranial nerve deficit.    Data Reviewed: Basic Metabolic Panel:  Lab 09/01/12 0454 08/28/12 2336  NA 131* 135  K 4.5 3.7  CL 94* 97  CO2 30 28  GLUCOSE 107* 120*  BUN 13 15  CREATININE 0.65 0.64  CALCIUM 9.5 9.1  MG -- --  PHOS -- --    Liver Function Tests: No results found for this basename:  AST:5,ALT:5,ALKPHOS:5,BILITOT:5,PROT:5,ALBUMIN:5 in the last 168 hours No results found for this basename: LIPASE:5,AMYLASE:5 in the last 168 hours No results found for this basename: AMMONIA:5 in the last 168 hours  CBC:  Lab 09/02/12 0645 09/01/12 0610 08/28/12 2336  WBC 7.7 7.6 9.0  NEUTROABS -- -- 6.3  HGB 9.0* 8.7* 9.7*  HCT 27.4* 27.0* 29.9*  MCV 82.8 82.6 82.4  PLT 395 377 387    Cardiac Enzymes: No results found for this basename: CKTOTAL:5,CKMB:5,CKMBINDEX:5,TROPONINI:5 in the last 168 hours BNP (last 3 results) No results found for this basename: PROBNP:3 in the last 8760 hours   CBG:  Lab 09/02/12 0704 09/01/12 2235 09/01/12 1634 09/01/12 1516 09/01/12 0728  GLUCAP 122* 120* 129* 128* 91    Recent Results (from the past 240 hour(s))  SURGICAL PCR SCREEN     Status: Abnormal   Collection Time   08/29/12  5:43 AM      Component Value Range Status Comment   MRSA, PCR POSITIVE (*) NEGATIVE Final    Staphylococcus aureus POSITIVE (*) NEGATIVE Final   URINE CULTURE     Status: Normal   Collection Time   08/30/12  3:11 PM      Component Value Range Status Comment   Specimen Description URINE, CATHETERIZED   Final    Special Requests NONE   Final    Culture  Setup Time 08/30/2012 15:34   Final    Colony Count >=100,000 COLONIES/ML   Final    Culture KLEBSIELLA PNEUMONIAE   Final    Report Status 09/01/2012 FINAL   Final    Organism ID, Bacteria KLEBSIELLA PNEUMONIAE   Final      Studies: Mr Lumbar Spine W Wo Contrast  08/31/2012  *RADIOLOGY REPORT*  Clinical Data: Severe right leg and knee pain.  Lumbar spine surgery 01/08/2012.  MRI LUMBAR SPINE WITHOUT AND WITH CONTRAST  Technique:  Multiplanar and multiecho pulse sequences of the lumbar spine were obtained without and with intravenous contrast.  Contrast: 20mL MULTIHANCE GADOBENATE DIMEGLUMINE 529 MG/ML IV SOLN  Comparison:  None available.  Findings:  Normal signal is present in the conus medullaris which  terminates at L1-2.  Patient is status post L3-4 PLIF.  The marrow signal, vertebral body heights, and alignment are otherwise normal. Limited imaging of the abdomen is unremarkable.  L1-2:  Negative.  L2-3:  Mild facet hypertrophy is present.  There is slight disc bulging.  No significant stenosis is present.  L3-4:  The patient is status post fusion.  Bilateral foraminotomies were performed.  The central canal and foramina are widely patent. The disc spacers are well positioned.  L4-5:  A mild broad-based disc herniation is present.  Facet hypertrophy is noted bilaterally.  This leads to mild distortion of the central canal without significant stenosis.  L5-S1:  A rightward disc herniation potentially contacts the exiting L5 nerve root within the foramen.  IMPRESSION:  1.  Status post L3-4 PLIF without evidence for residual or recurrent stenosis. 2.  Rightward disc herniation at L5-S1 potentially contacts the exiting right L5 nerve root. 3.  Slight disc bulging at to L2-3 and L4-5 without significant stenosis at either level.   Original Report Authenticated By: Marin Roberts, M.D.    Dg Pelvis Portable  09/01/2012  *RADIOLOGY REPORT*  Clinical Data: Right hip pain.  PORTABLE PELVIS  Comparison: None  Findings: There are mild right hip joint degenerative changes but no acute bony findings or plain film evidence of avascular necrosis.  Pubic symphysis and SI joints are intact.  A left hip prosthesis is noted along with lumbar fusion hardware.  IMPRESSION: Degenerative changes but no acute bony findings.   Original Report Authenticated By: Rudie Meyer, M.D.    Dg Chest Port 1 View  09/01/2012  *RADIOLOGY REPORT*  Clinical Data: Status post central line placement.  PORTABLE CHEST - 1 VIEW  Comparison: Chest radiograph 08/30/2012  Findings: A right IJ central venous catheter is in place.  The distal tip projects over the distal superior vena cava.  Heart and mediastinal contours are stable.  Pulmonary  vascular prominence appears similar to recent prior study.  Lung volumes are low.  No focal airspace disease or pleural effusion.  Postsurgical changes of the lower cervical spine.  Negative for pneumothorax.  No subcutaneous gas in the chest wall.  IMPRESSION:  Satisfactory position of right IJ central venous catheter.  No complicating feature.   Original Report Authenticated By: Britta Mccreedy, M.D.    Dg Chest Port 1 View  08/30/2012  *RADIOLOGY REPORT*  Clinical Data: Cough and weakness.  PORTABLE CHEST - 1 VIEW  Comparison: 01/08/2012 and 01/02/2012.  Findings: Central pulmonary vascular prominence without pulmonary edema.  No segmental infiltrate or gross pneumothorax.  Calcified slightly tortuous aorta.  Heart size top normal.  Postsurgical changes lower cervical spine.  IMPRESSION: No acute abnormality.  If there is a persistent unexplained cough, follow-up two-view exam may be considered.   Original Report Authenticated By: Lacy Duverney, M.D.    Dg Knee Right Port  08/29/2012  *RADIOLOGY REPORT*  Clinical Data: Severe right knee pain.  PORTABLE RIGHT KNEE - 1-2 VIEW  Comparison: None.  Findings: There is marked narrowing of the medial compartment with small marginal osteophytes. Slight sclerosis of the medial compartment.  Lateral and patellofemoral compartments appear essentially normal.  No appreciable joint effusion.  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   Original Report Authenticated By: Francene Boyers, M.D.     Scheduled Meds:   . aspirin EC  325 mg Oral Q breakfast  . chlorhexidine  60 mL Topical Once  . Chlorhexidine Gluconate Cloth  6 each Topical Q0600  . [COMPLETED] dexmedetomidine  0.4-1.2 mcg/kg/hr Intravenous To OR  . docusate sodium  300 mg Oral QHS  . enoxaparin (LOVENOX) injection  80 mg Subcutaneous Daily  . [EXPIRED] HYDROmorphone      . [EXPIRED] HYDROmorphone      . insulin aspart  0-15 Units Subcutaneous TID WC  . insulin  aspart  0-5 Units Subcutaneous QHS  . insulin aspart  4 Units Subcutaneous TID WC  . ketorolac  30 mg Intravenous Q6H  . [EXPIRED] ketorolac      . lamoTRIgine  200 mg Oral Daily  . levofloxacin  500 mg Oral Q24 Hr x 2  . levothyroxine  150 mcg Oral QAC breakfast  . LORazepam  0.5 mg Intravenous Once  . losartan  50 mg Oral Daily  . [EXPIRED] midazolam      . [  COMPLETED] midazolam  1 mg Intravenous Once  . [COMPLETED] midazolam  1 mg Intravenous Once  . montelukast  10 mg Oral QHS  . morphine  30 mg Oral Q12H  . mupirocin ointment  1 application Nasal BID  . Oxcarbazepine  300 mg Oral Daily  . OXcarbazepine  600 mg Oral QHS  . simvastatin  40 mg Oral QPM  . sodium chloride  3 mL Intravenous Q12H  . [COMPLETED] vancomycin  2,000 mg Intravenous To OR  . [COMPLETED] vancomycin  1,000 mg Intravenous Q12H  . [DISCONTINUED] aspirin EC  81 mg Oral Daily  . [DISCONTINUED]  HYDROmorphone (DILAUDID) injection  1 mg Intravenous Once  . [DISCONTINUED] levofloxacin (LEVAQUIN) IV  500 mg Intravenous Q24H  . [DISCONTINUED] Liraglutide  1.8 mg Subcutaneous Q24H  . [DISCONTINUED] vancomycin (VANCOCIN) 1000 mg IVPB  1,000 mg Intravenous Once   Continuous Infusions:   Principal Problem:  *Right anterior knee pain Active Problems:  HYPOTHYROIDISM  DM type 2 with diabetic peripheral neuropathy  ANEMIA-NOS  BIPOLAR AFFECTIVE DISORDER, DEPRESSED  HYPERTENSION  ASTHMA  GERD  Obesity, morbid (more than 100 lbs over ideal weight or BMI > 40)    Time spent: 40 minutes   [facility]  [facility] Pager [phone]. If 8PM-8AM, please contact night-coverage at [email or web address], password [facility] 09/02/2012, 7:47 AM  LOS: 5 days

## 2012-09-02 NOTE — Progress Notes (Signed)
SPORTS MEDICINE AND JOINT REPLACEMENT  Georgena Spurling, MD   Altamese Cabal, PA-C [address] [place], [place], [place]  [address]                             [phone]   PROGRESS NOTE  Subjective:  negative for Chest Pain  negative for Shortness of Breath  negative for Nausea/Vomiting   negative for Calf Pain t   Tolerating Diet: yes        Patient reports pain as 5/10  Objective: Vital signs in last 24 hours:   Patient Vitals for the past 24 hrs:  BP Temp Pulse Resp SpO2  09/02/12 0652 134/58 mmHg 97.6 F (36.4 C) 90  20  99 %  09/02/12 0646 135/56 mmHg 97.9 F (36.6 C) 92  20  98 %  09/02/12 0400 - - - 20  96 %  09/02/12 0000 - - - 20  96 %  09/01/12 2334 130/55 mmHg 97.1 F (36.2 C) 95  20  96 %  09/01/12 2000 - - - 20  96 %  09/01/12 1630 124/49 mmHg 98.7 F (37.1 C) 83  16  95 %  09/01/12 1552 - 98.3 F (36.8 C) 91  22  96 %  09/01/12 1545 - - 93  26  97 %  09/01/12 1538 139/62 mmHg - 88  20  96 %  09/01/12 1530 - - 87  21  98 %  09/01/12 1523 139/60 mmHg - 100  20  99 %  09/01/12 1515 - - 88  19  94 %  09/01/12 1508 151/55 mmHg - 98  21  94 %  09/01/12 1500 - - - 20  -  09/01/12 1453 163/67 mmHg - - 21  -  09/01/12 1446 156/65 mmHg - - 17  -  09/01/12 1445 - - - 28  -  09/01/12 1440 51/27 mmHg - - 19  -  09/01/12 1430 - - - 21  -  09/01/12 1423 160/84 mmHg - 106  24  94 %  09/01/12 1415 - - 102  18  100 %  09/01/12 1407 162/94 mmHg - 102  23  98 %  09/01/12 1400 - - 103  25  96 %  09/01/12 1357 154/74 mmHg - 99  22  100 %  09/01/12 1345 - - 102  18  96 %  09/01/12 1339 166/92 mmHg - 94  13  100 %  09/01/12 1330 - 97.3 F (36.3 C) 81  - 97 %       Intake/Output from previous day:   12/04 0701 - 12/05 0700 In: 1150 [I.V.:1150] Out: 400 [Urine:400]   Intake/Output this shift:       Intake/Output      12/04 0701 - 12/05 0700 12/05 0701 - 12/06 0700   P.O. 0    I.V. (mL/kg) 1150 (7.6)    Total Intake(mL/kg) 1150 (7.6)    Urine (mL/kg/hr) 400  (0.1)    Total Output 400    Net +750         Stool Occurrence 1 x       LABORATORY DATA:  Basename 09/02/12 0645 09/01/12 0610 08/28/12 2336  WBC 7.7 7.6 9.0  HGB 9.0* 8.7* 9.7*  HCT 27.4* 27.0* 29.9*  PLT 395 377 387    Basename 09/01/12 0610 08/28/12 2336  NA 131* 135  K 4.5 3.7  CL 94* 97  CO2 30 28  BUN 13 15  CREATININE 0.65 0.64  GLUCOSE 107* 120*  CALCIUM 9.5 9.1   Lab Results  Component Value Date   INR 1.18 08/31/2012   INR 1.23 08/29/2012    Examination:  Well Nourished, Well Developed in no acute distress Super morbid obesity Negative homan's right lower extremity  Wound Exam: 2 cm incisons in the medial and lateral joint lines, wrapped after surgery  Drainage: none    Sensory Exam: neurovascularly intact RLE    Assessment:    1 Day Post-Op  Procedure(s) (LRB): ARTHROSCOPY KNEE (Right)  ADDITIONAL DIAGNOSIS:  Principal Problem:  *Right anterior knee pain Active Problems:  HYPOTHYROIDISM  DM type 2 with diabetic peripheral neuropathy  ANEMIA-NOS  BIPOLAR AFFECTIVE DISORDER, DEPRESSED  HYPERTENSION  ASTHMA  GERD  Obesity, morbid (more than 100 lbs over ideal weight or BMI > 40)     Plan: Physical Therapy as ordered   Weight Bearing as Tolerated, May use brace when standing  Pain Control  Follow Up with Dr. Sherlean Foot in 10 Days  Okay to discharge from ortho standpoint  Please call with any questions  Thank you for the consult         Tana Trefry 09/02/2012, 9:05 AM

## 2012-09-02 NOTE — Addendum Note (Signed)
Addendum  created 09/02/12 1520 by Hessie Dibble, CRNA   Modules edited:Anesthesia Medication Administration

## 2012-09-03 ENCOUNTER — Inpatient Hospital Stay (HOSPITAL_COMMUNITY): Payer: BC Managed Care – PPO

## 2012-09-03 LAB — BASIC METABOLIC PANEL
BUN: 7 mg/dL (ref 6–23)
CO2: 29 mEq/L (ref 19–32)
Calcium: 9.6 mg/dL (ref 8.4–10.5)
Chloride: 95 mEq/L — ABNORMAL LOW (ref 96–112)
Creatinine, Ser: 0.57 mg/dL (ref 0.50–1.10)
GFR calc Af Amer: >90 mL/min (ref >90)
GFR calc non Af Amer: >90 mL/min (ref >90)
Glucose, Bld: 115 mg/dL — ABNORMAL HIGH (ref 70–99)
Potassium: 4.2 mEq/L (ref 3.5–5.1)
Sodium: 135 mEq/L (ref 135–145)

## 2012-09-03 LAB — GLUCOSE, CAPILLARY
Glucose-Capillary: 95 mg/dL (ref 70–99)
Glucose-Capillary: 132 mg/dL — ABNORMAL HIGH (ref 70–99)
Glucose-Capillary: 103 mg/dL — ABNORMAL HIGH (ref 70–99)
Glucose-Capillary: 99 mg/dL (ref 70–99)
Glucose-Capillary: 131 mg/dL — ABNORMAL HIGH (ref 70–99)

## 2012-09-03 MED ORDER — CYCLOBENZAPRINE HCL 10 MG PO TABS: 10.0000 mg | ORAL_TABLET | Freq: Three times a day (TID) | ORAL | Status: DC | PRN

## 2012-09-03 MED ORDER — GADOBENATE DIMEGLUMINE 529 MG/ML IV SOLN
20.0000 mL | Freq: Once | INTRAVENOUS | Status: AC | PRN
  Administered 2012-09-03: 20 mL via INTRAVENOUS

## 2012-09-03 MED ORDER — FUROSEMIDE 80 MG PO TABS: 40.0000 mg | ORAL_TABLET | Freq: Every day | ORAL | Status: DC

## 2012-09-03 MED ORDER — OXYCODONE-ACETAMINOPHEN 10-325 MG PO TABS: 1.0000 | ORAL_TABLET | Freq: Three times a day (TID) | ORAL | Status: DC | PRN

## 2012-09-03 MED ORDER — ENOXAPARIN SODIUM 80 MG/0.8ML ~~LOC~~ SOLN: 80.0000 mg | Freq: Every day | SUBCUTANEOUS | Status: DC

## 2012-09-03 NOTE — Discharge Summary (Addendum)
Physician Discharge Summary  Amy Barnes MRN: [id number] DOB/AGE: 02/17/1951 61 y.o.  PCP: [name], MD   Admit date: 08/28/2012 Discharge date: 09/03/2012  Discharge Diagnoses:  Status post Right knee arthroscopy with partial medial and  lateral meniscectomy. :  *Right anterior knee pain Active Problems:  HYPOTHYROIDISM  DM type 2 with diabetic peripheral neuropathy  ANEMIA-NOS  BIPOLAR AFFECTIVE DISORDER, DEPRESSED  HYPERTENSION  ASTHMA  GERD  Obesity, morbid (more than 100 lbs over ideal weight or BMI > 40)  Followup discharge instructions #1 BMP and CBC in one week #2 no driving for at least a month because her multiple pain medications   Medication List     As of 09/03/2012  7:50 AM    STOP taking these medications         ibuprofen 800 MG tablet   Commonly known as: ADVIL,MOTRIN      TAKE these medications         acetaminophen 500 MG tablet   Commonly known as: TYLENOL   Take 1,000 mg by mouth every 6 (six) hours as needed. For pain      albuterol 108 (90 BASE) MCG/ACT inhaler   Commonly known as: PROVENTIL HFA;VENTOLIN HFA   Inhale 2 puffs into the lungs every 6 (six) hours as needed. For shortness of breath      aspirin 81 MG EC tablet   Take 81 mg by mouth daily.      CALCIUM + D PO   Take 1 tablet by mouth daily.      CENTRUM SILVER PO   Take 1 tablet by mouth daily.      cyclobenzaprine 10 MG tablet   Commonly known as: FLEXERIL   Take 1 tablet (10 mg total) by mouth 3 (three) times daily as needed. For muscle pain      docusate sodium 100 MG capsule   Commonly known as: COLACE   Take 300 mg by mouth at bedtime.      furosemide 80 MG tablet   Commonly known as: LASIX   Take 0.5 tablets (40 mg total) by mouth daily. For fluid      IRON PO   Take 75 mg by mouth daily.      lamoTRIgine 200 MG tablet   Commonly known as: LAMICTAL   Take 200 mg by mouth daily.      levothyroxine 150 MCG tablet   Commonly known as: SYNTHROID,  LEVOTHROID   Take 150 mcg by mouth daily.      Liraglutide 18 MG/3ML Soln   Inject 1.8 mg into the skin daily.      losartan 50 MG tablet   Commonly known as: COZAAR   Take 50 mg by mouth daily.      metFORMIN 500 MG 24 hr tablet   Commonly known as: GLUCOPHAGE-XR   Take 500 mg by mouth 3 (three) times daily.      montelukast 10 MG tablet   Commonly known as: SINGULAIR   Take 10 mg by mouth at bedtime.      morphine 15 MG 12 hr tablet   Commonly known as: MS CONTIN   Take 30 mg by mouth 2 (two) times daily.      Oxcarbazepine 300 MG tablet   Commonly known as: TRILEPTAL   Take 300-600 mg by mouth 2 (two) times daily. 1 in the morning and 2 every evening      oxyCODONE-acetaminophen 10-325 MG per tablet   Commonly  known as: PERCOCET   Take 1 tablet by mouth every 8 (eight) hours as needed. For pain      predniSONE 10 MG tablet   Commonly known as: DELTASONE   Take 10 mg by mouth daily. Take for 5 days. Started medication on 08/25/12      promethazine 25 MG tablet   Commonly known as: PHENERGAN   Take 25 mg by mouth every 6 (six) hours as needed. For nausea      simvastatin 40 MG tablet   Commonly known as: ZOCOR   Take 40 mg by mouth every evening.    Lovenox 80 mg subcutaneous daily for 14 days     Discharge Condition: Stable Disposition: 01-Home or Self Care   Consults:  #1 orthopedics  Significant Diagnostic Studies: Mr Lumbar Spine W Wo Contrast  08/31/2012  *RADIOLOGY REPORT*  Clinical Data: Severe right leg and knee pain.  Lumbar spine surgery 01/08/2012.  MRI LUMBAR SPINE WITHOUT AND WITH CONTRAST  Technique:  Multiplanar and multiecho pulse sequences of the lumbar spine were obtained without and with intravenous contrast.  Contrast: 20mL MULTIHANCE GADOBENATE DIMEGLUMINE 529 MG/ML IV SOLN  Comparison:  None available.  Findings:  Normal signal is present in the conus medullaris which terminates at L1-2.  Patient is status post L3-4 PLIF.  The marrow  signal, vertebral body heights, and alignment are otherwise normal. Limited imaging of the abdomen is unremarkable.  L1-2:  Negative.  L2-3:  Mild facet hypertrophy is present.  There is slight disc bulging.  No significant stenosis is present.  L3-4:  The patient is status post fusion.  Bilateral foraminotomies were performed.  The central canal and foramina are widely patent. The disc spacers are well positioned.  L4-5:  A mild broad-based disc herniation is present.  Facet hypertrophy is noted bilaterally.  This leads to mild distortion of the central canal without significant stenosis.  L5-S1:  A rightward disc herniation potentially contacts the exiting L5 nerve root within the foramen.  IMPRESSION:  1.  Status post L3-4 PLIF without evidence for residual or recurrent stenosis. 2.  Rightward disc herniation at L5-S1 potentially contacts the exiting right L5 nerve root. 3.  Slight disc bulging at to L2-3 and L4-5 without significant stenosis at either level.   Original Report Authenticated By: Marin Roberts, M.D.    Dg Pelvis Portable  09/01/2012  *RADIOLOGY REPORT*  Clinical Data: Right hip pain.  PORTABLE PELVIS  Comparison: None  Findings: There are mild right hip joint degenerative changes but no acute bony findings or plain film evidence of avascular necrosis.  Pubic symphysis and SI joints are intact.  A left hip prosthesis is noted along with lumbar fusion hardware.  IMPRESSION: Degenerative changes but no acute bony findings.   Original Report Authenticated By: Rudie Meyer, M.D.    Dg Chest Port 1 View  09/01/2012  *RADIOLOGY REPORT*  Clinical Data: Status post central line placement.  PORTABLE CHEST - 1 VIEW  Comparison: Chest radiograph 08/30/2012  Findings: A right IJ central venous catheter is in place.  The distal tip projects over the distal superior vena cava.  Heart and mediastinal contours are stable.  Pulmonary vascular prominence appears similar to recent prior study.  Lung  volumes are low.  No focal airspace disease or pleural effusion.  Postsurgical changes of the lower cervical spine.  Negative for pneumothorax.  No subcutaneous gas in the chest wall.  IMPRESSION:  Satisfactory position of right IJ central venous catheter.  No complicating feature.   Original Report Authenticated By: Britta Mccreedy, M.D.    Dg Chest Port 1 View  08/30/2012  *RADIOLOGY REPORT*  Clinical Data: Cough and weakness.  PORTABLE CHEST - 1 VIEW  Comparison: 01/08/2012 and 01/02/2012.  Findings: Central pulmonary vascular prominence without pulmonary edema.  No segmental infiltrate or gross pneumothorax.  Calcified slightly tortuous aorta.  Heart size top normal.  Postsurgical changes lower cervical spine.  IMPRESSION: No acute abnormality.  If there is a persistent unexplained cough, follow-up two-view exam may be considered.   Original Report Authenticated By: Lacy Duverney, M.D.    Dg Knee Right Port  08/29/2012  *RADIOLOGY REPORT*  Clinical Data: Severe right knee pain.  PORTABLE RIGHT KNEE - 1-2 VIEW  Comparison: None.  Findings: There is marked narrowing of the medial compartment with small marginal osteophytes. Slight sclerosis of the medial compartment.  Lateral and patellofemoral compartments appear essentially normal.  No appreciable joint effusion.  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   Original Report Authenticated By: Francene Boyers, M.D.       Microbiology: Recent Results (from the past 240 hour(s))  SURGICAL PCR SCREEN     Status: Abnormal   Collection Time   08/29/12  5:43 AM      Component Value Range Status Comment   MRSA, PCR POSITIVE (*) NEGATIVE Final    Staphylococcus aureus POSITIVE (*) NEGATIVE Final   URINE CULTURE     Status: Normal   Collection Time   08/30/12  3:11 PM      Component Value Range Status Comment   Specimen Description URINE, CATHETERIZED   Final    Special Requests NONE   Final    Culture  Setup Time  08/30/2012 15:34   Final    Colony Count >=100,000 COLONIES/ML   Final    Culture KLEBSIELLA PNEUMONIAE   Final    Report Status 09/01/2012 FINAL   Final    Organism ID, Bacteria KLEBSIELLA PNEUMONIAE   Final      Labs: Results for orders placed during the hospital encounter of 08/28/12 (from the past 48 hour(s))  GLUCOSE, CAPILLARY     Status: Abnormal   Collection Time   09/01/12  3:16 PM      Component Value Range Comment   Glucose-Capillary 128 (*) 70 - 99 mg/dL   GLUCOSE, CAPILLARY     Status: Abnormal   Collection Time   09/01/12  4:34 PM      Component Value Range Comment   Glucose-Capillary 129 (*) 70 - 99 mg/dL   TYPE AND SCREEN     Status: Normal   Collection Time   09/01/12  5:00 PM      Component Value Range Comment   ABO/RH(D) A POS      Antibody Screen POS      Sample Expiration 09/04/2012      Antibody Identification ANTI-K      PT AG Type NEGATIVE FOR KELL ANTIGEN      DAT, complement NOT NEEDED      DAT, IgG NEG     HEMOGLOBIN A1C     Status: Abnormal   Collection Time   09/01/12  5:23 PM      Component Value Range Comment   Hemoglobin A1C 6.2 (*) <5.7 %    Mean Plasma Glucose 131 (*) <117 mg/dL   GLUCOSE, CAPILLARY     Status: Abnormal   Collection Time   09/01/12 10:35 PM  Component Value Range Comment   Glucose-Capillary 120 (*) 70 - 99 mg/dL   CBC     Status: Abnormal   Collection Time   09/02/12  6:45 AM      Component Value Range Comment   WBC 7.7  4.0 - 10.5 K/uL    RBC 3.31 (*) 3.87 - 5.11 MIL/uL    Hemoglobin 9.0 (*) 12.0 - 15.0 g/dL    HCT 13.0 (*) 86.5 - 46.0 %    MCV 82.8  78.0 - 100.0 fL    MCH 27.2  26.0 - 34.0 pg    MCHC 32.8  30.0 - 36.0 g/dL    RDW 78.4  69.6 - 29.5 %    Platelets 395  150 - 400 K/uL   GLUCOSE, CAPILLARY     Status: Abnormal   Collection Time   09/02/12  7:04 AM      Component Value Range Comment   Glucose-Capillary 122 (*) 70 - 99 mg/dL   BASIC METABOLIC PANEL     Status: Abnormal   Collection Time   09/02/12   9:10 AM      Component Value Range Comment   Sodium 128 (*) 135 - 145 mEq/L    Potassium 4.3  3.5 - 5.1 mEq/L    Chloride 92 (*) 96 - 112 mEq/L    CO2 29  19 - 32 mEq/L    Glucose, Bld 138 (*) 70 - 99 mg/dL    BUN 10  6 - 23 mg/dL    Creatinine, Ser 2.84  0.50 - 1.10 mg/dL    Calcium 8.5  8.4 - 13.2 mg/dL    GFR calc non Af Amer >90  >90 mL/min    GFR calc Af Amer >90  >90 mL/min   GLUCOSE, CAPILLARY     Status: Normal   Collection Time   09/02/12 11:11 AM      Component Value Range Comment   Glucose-Capillary 87  70 - 99 mg/dL    Comment 1 Notify RN     GLUCOSE, CAPILLARY     Status: Normal   Collection Time   09/02/12  4:00 PM      Component Value Range Comment   Glucose-Capillary 93  70 - 99 mg/dL   GLUCOSE, CAPILLARY     Status: Normal   Collection Time   09/02/12 10:24 PM      Component Value Range Comment   Glucose-Capillary 95  70 - 99 mg/dL   GLUCOSE, CAPILLARY     Status: Abnormal   Collection Time   09/03/12  6:14 AM      Component Value Range Comment   Glucose-Capillary 132 (*) 70 - 99 mg/dL      HPI :* 61 year old [place] female presented with knee pain . She has had severe right knee pain.Onset of symptoms was gradual starting a few weeks ago with rapidly worsening course since that time. The patient noted no past surgery on the left knee. Prior procedures on the knee include none. Patient has been treated conservatively with over-the-counter NSAIDs and activity modification. Patient currently rates pain in the knee at 10 out of 10 with activity. There is pain at night. Patient admitted for pain control. She was found to have acute right knee pain due to medial and lateral meniscal tear, she is unable to stand up or bear weight on it, orthopedics   arthroscopic surgical intervention on 09/01/2012.   She had seen Dr. Lovell Sheehan  in the office last week.  Dr. Lovell Sheehan thought the pain was more likely coming from her knee but he  ordered a lumbar MRI just to make sure that  there was a significant lumbar spine pathology contributing to her pain. She had not gotten the MRI scan yet and was admitted for pain control. The patient's lumbar spine MRI showed results as above. Details hospital course as follows   HOSPITAL COURSE:   1. Right knee pain, status post, Right knee arthroscopy with partial medial and  lateral meniscectomy.  unable to bear weight on the right leg, in a patient with morbid obesity and MRI evidence of both lateral and medial meniscal tear along with joint effusion - orthopedics and neurosurgery were consulted, continue pain control, further recommendations per orthopedics, for now patient is unable to stand up or bear weight at all on the right leg, She has been seen by Dr. Valentina Gu on 08-2012 who performed surgery on 09/01/2012 of note patient is on Lovenox for DVT prophylaxis however it is weight-based, she will continue with this for another 2 weeks. Also seen by Dr. Lovell Sheehan neurosurgeon as she has history of L-spine problem, L-spine MRI and Dr. Lovell Sheehan evaluation not consistent with any ongoing L-spine issues. Physical therapy recommended home health physical therapy as well as a rolling walker and bedside commode. Patient to followup with Dr. leucine 10 days   2. Diabetes mellitus type 2 morbid obesity - during this hospitalization the patient was treated with pre-meal NovoLog along with sliding scale insulin, she will resume her home medications CBG (last 3)    3. History of hypertension. Continue ARB, monitor blood pressure.  4. Hypothyroidism. Continue home dose Synthroid.  5. Dyslipidemia continue home dose statin.  6. Morbid obesity. Outpatient follow with PCP and dietician.  7.anemia hemoglobin stable at around 9.0, continue Lovenox for DVT prophylaxis , hemoglobin has been trending down in the outpatient setting. Patient would benefit from a full GI workup to be arranged for by the primary care provider   8. Pain control patient on multiple  narcotic medications, patient has been advised not to drive for at least a month 9. hyponatremia patient has been placed on fluid restriction and a dose of Lasix has been decreased she would need a repeat BMP in one week. Sodium improved to 135, from 128.   Addendum  Discussed with Dr. Valentina Gu  No further surgical intervention at this time  Pain control discussed extensively with the patient's husband  We have. almost double the dose of her MS Contin and provided her with Percocet  She is already on Flexeril  Per discussion with Dr. Valentina Gu an MRI of the hip and leg has been ordered  If no abnormality on the MRI the patient will be discharged home by [facility]  The patient needs an outpatient psychiatric evaluation as well as pain management consultation if the pain persists n Discharge Exam:  Blood pressure 138/56, pulse 89, temperature 98 F (36.7 C), temperature source Oral, resp. rate 20, height 5\' 6"  (1.676 m), weight 152.3 kg (335 lb 12.2 oz), SpO2 97.00%.   Awake Alert, Oriented X 3, No new F.N deficits, Normal affect  .AT,PERRAL  Supple Neck,No JVD, No cervical lymphadenopathy appriciated.  Symmetrical Chest wall movement, Good air movement bilaterally, CTAB  RRR,No Gallops,Rubs or new Murmurs, No Parasternal Heave  +ve B.Sounds, Abd Soft, Non tender, No organomegaly appriciated, No rebound - guarding or rigidity.  No Cyanosis, Clubbing or edema, No new Rash or bruise  R knee swollen, tender to  palpate, no warmth or erythema       Discharge Orders    Future Orders Please Complete By Expires   Diet - low sodium heart healthy      Increase activity slowly         Follow-up Information    Follow up with LUCEY,STEPHEN D, MD. Schedule an appointment as soon as possible for a visit in 10 days.   Contact information:   [address] [place] [address] [phone]       Follow up with [name], MD. Schedule an appointment as soon as possible for a visit in 1  week.   Contact information:   [address] [employer] [address] [place] [address] [phone]          Signed: [name] 09/03/2012, 7:50 AM

## 2012-09-03 NOTE — Progress Notes (Signed)
Physical Therapy Treatment Patient Details Name: Amy Barnes MRN: [id number] DOB: 1951/01/28 Today's Date: 09/03/2012 Time: 4098-1191 PT Time Calculation (min): 19 min  PT Assessment / Plan / Recommendation Comments on Treatment Session  Pt admitted s/p right meniscal tear repair. Pt limited by severe 10/10 pain in right knee/right LE throughout session. Only able to participate in therapuetic exercises. Will follow as able. If pain continues to limit mobility, may require STSNF.    Follow Up Recommendations  Home health PT     Does the patient have the potential to tolerate intense rehabilitation     Barriers to Discharge        Equipment Recommendations  Rolling walker with 5" wheels;3 in 1 bedside comode    Recommendations for Other Services    Frequency Min 5X/week   Plan Discharge plan remains appropriate;Frequency remains appropriate    Precautions / Restrictions Precautions Precautions: Fall Restrictions Weight Bearing Restrictions: Yes RLE Weight Bearing: Weight bearing as tolerated   Pertinent Vitals/Pain 10/10 in right LE. Pt repositioned and ice applied. MD made aware.    Mobility  Bed Mobility Bed Mobility: Not assessed Transfers Transfers: Not assessed Ambulation/Gait Ambulation/Gait Assistance: Not tested (comment) Stairs: No Wheelchair Mobility Wheelchair Mobility: No    Exercises General Exercises - Lower Extremity Ankle Circles/Pumps: AROM;Right;10 reps;Supine Quad Sets: AROM;Right;10 reps;Supine Short Arc Quad: AROM;Right;Supine (2 reps performed.) Heel Slides: AROM;Right;10 reps;Supine   PT Diagnosis:    PT Problem List:   PT Treatment Interventions:     PT Goals Acute Rehab PT Goals PT Goal Formulation: With patient/family Time For Goal Achievement: 09/09/12 Potential to Achieve Goals: Good PT Goal: Perform Home Exercise Program - Progress: Progressing toward goal  Visit Information  Last PT Received On: 09/03/12 Assistance Needed:  +1    Subjective Data  Subjective: "The pain is too much!" Patient Stated Goal: Get well. Hopefully go home.   Cognition  Overall Cognitive Status: Appears within functional limits for tasks assessed/performed Arousal/Alertness: Awake/alert Orientation Level: Appears intact for tasks assessed Behavior During Session: [facility] for tasks performed    Balance  Balance Balance Assessed: No  End of Session PT - End of Session Activity Tolerance: Patient limited by pain Patient left: in chair;with call bell/phone within reach;with family/visitor present Nurse Communication: Mobility status   GP     Cephus Shelling 09/03/2012, 10:19 AM  09/03/2012 Cephus Shelling, PT, DPT [phone]

## 2012-09-03 NOTE — Evaluation (Signed)
Occupational Therapy Evaluation Patient Details Name: Amy Barnes MRN: [id number] DOB: 03-Sep-1951 Today's Date: 09/03/2012 Time: 4098-1191 OT Time Calculation (min): 27 min  OT Assessment / Plan / Recommendation Clinical Impression  Pt is a 61 yr old female admitted for RLE pain.  Underwent arthroscopic surgery to repair torn meniscus.  Pt currently needs overall min -mod assist for simulated selfcare tasks, but is severely limited with participation and tolerance secondary to pain.  Feel she will benefit from acute care OT services to help reach a supervision level for home but anticipate no HHOT needs or DME needs.    OT Assessment  Patient needs continued OT Services    Follow Up Recommendations  No OT follow up    Barriers to Discharge None    Equipment Recommendations  None recommended by OT       Frequency  Min 2X/week    Precautions / Restrictions Precautions Precautions: Fall Restrictions Weight Bearing Restrictions: No RLE Weight Bearing: Weight bearing as tolerated   Pertinent Vitals/Pain Pain 10/10 in the right LE, nursing aware and LE repositioned with ice placed    ADL  Eating/Feeding: Simulated;Independent Where Assessed - Eating/Feeding: Chair Grooming: Simulated;Set up Where Assessed - Grooming: Supported sitting Upper Body Bathing: Simulated;Set up Where Assessed - Upper Body Bathing: Supported sitting Lower Body Bathing: Simulated;Moderate assistance Where Assessed - Lower Body Bathing: Supported sit to stand Upper Body Dressing: Simulated;Set up Where Assessed - Upper Body Dressing: Supported sitting Lower Body Dressing: Simulated;+1 Total assistance Where Assessed - Lower Body Dressing: Unsupported sit to stand Toilet Transfer: Simulated;Moderate assistance Toilet Transfer Method: Stand pivot Toilet Transfer Equipment: Other (comment) (To bedside chair, pt declined need to toilet.) Toileting - Clothing Manipulation and Hygiene: Simulated;Moderate  assistance Where Assessed - Toileting Clothing Manipulation and Hygiene: Other (comment) (sit to stand from EOB) Tub/Shower Transfer Method: Not assessed Equipment Used: Rolling walker Transfers/Ambulation Related to ADLs: Pt overall mod assist for stand pivot transfer from bed to wheelchair.  Did not attempt ambulation secondary to pt being in extreme pain. ADL Comments: Pt and spouse report pt having all AE from previous hip and back issues.  Also has wide 3:1 and shower seat.  Biggest concern is her pain level which is currently not tolerable.  Husband does not want to take her home at the current level for fear that he will have to bring her back.  Feel if pain is decreased she can tolerate more and be safe for discharge home.    OT Diagnosis: Generalized weakness;Acute pain  OT Problem List: Decreased strength;Decreased activity tolerance;Impaired balance (sitting and/or standing);Pain OT Treatment Interventions: Self-care/ADL training;DME and/or AE instruction;Balance training;Patient/family education;Therapeutic activities   OT Goals Acute Rehab OT Goals OT Goal Formulation: With patient/family Time For Goal Achievement: 09/03/12 Potential to Achieve Goals: Good ADL Goals Pt Will Perform Grooming: with supervision;Supported;Standing at sink;Other (comment) (2 tasks) ADL Goal: Grooming - Progress: Goal set today Pt Will Perform Lower Body Dressing: with supervision;with adaptive equipment;Sit to stand from bed ADL Goal: Lower Body Dressing - Progress: Goal set today Pt Will Transfer to Toilet: with supervision;with DME;Extra wide 3-in-1 ADL Goal: Toilet Transfer - Progress: Goal set today Miscellaneous OT Goals Miscellaneous OT Goal #1: Pt will perform all selfcare/transfers with pain less than or equal to 5/10. OT Goal: Miscellaneous Goal #1 - Progress: Goal set today  Visit Information  Last OT Received On: 09/03/12 Assistance Needed: +1    Subjective Data  Subjective: Pt  reporting moderate pain in the  RLE from thigh to ankle. Patient Stated Goal: To get her pain relieved.   Prior Functioning     Home Living Lives With: Daughter Available Help at Discharge: Family;Available PRN/intermittently Type of Home: House Home Access: Stairs to enter;Ramped entrance (Ramp in back. Stairs in front.) Entrance Stairs-Number of Steps: 4 Entrance Stairs-Rails: None Home Layout: Two level;Able to live on main level with bedroom/bathroom Bathroom Shower/Tub: Walk-in shower;Curtain Bathroom Toilet: Handicapped height Home Adaptive Equipment: Walker - rolling;Straight cane;Wheelchair - manual;Crutches Prior Function Level of Independence: Independent with assistive device(s) (Uses cane.) Able to Take Stairs?: Yes Driving: Yes Communication Communication: No difficulties Dominant Hand: Right         Vision/Perception Vision - Assessment Vision Assessment: Vision not tested Perception Perception: Within Functional Limits Praxis Praxis: Intact   Cognition  Overall Cognitive Status: Appears within functional limits for tasks assessed/performed Arousal/Alertness: Awake/alert Orientation Level: Appears intact for tasks assessed Behavior During Session: [facility] for tasks performed    Extremity/Trunk Assessment Right Upper Extremity Assessment RUE ROM/Strength/Tone: Within functional levels;Unable to fully assess;Due to pain RUE Sensation: WFL - Light Touch RUE Coordination: WFL - gross/fine motor Left Upper Extremity Assessment LUE ROM/Strength/Tone: Within functional levels;Due to pain;Unable to fully assess LUE Sensation: WFL - Light Touch LUE Coordination: WFL - gross/fine motor Trunk Assessment Trunk Assessment: Normal     Mobility Bed Mobility Bed Mobility: Supine to Sit Supine to Sit: 4: Min assist;HOB flat Transfers Transfers: Sit to Stand Sit to Stand: 4: Min assist;With upper extremity assist;From elevated surface           Balance  Balance Balance Assessed: Yes Static Standing Balance Static Standing - Balance Support: Right upper extremity supported;Left upper extremity supported Static Standing - Level of Assistance: 5: Stand by assistance   End of Session OT - End of Session Activity Tolerance: Patient tolerated treatment well Patient left: in chair Nurse Communication: Mobility status;Other (comment) (Pain level and concerns for going home)     Kenly Xiao OTR/L Pager number [phone] 09/03/2012, 10:34 AM

## 2012-09-03 NOTE — Progress Notes (Addendum)
Discussed with Dr. Valentina Gu, regarding the patient complaining of pain in her right leg No further surgical intervention at this time Pain control discussed extensively with the patient's husband We have. almost double the dose of her MS Contin and provided her with Percocet She is already on Flexeril   Per discussion with Dr. Valentina Gu an MRI of the hip and leg has been ordered If no abnormality on the MRI the patient will be discharged home by ambulance  The patient needs an outpatient psychiatric evaluation as well as pain management consultation if the pain persists

## 2012-09-04 ENCOUNTER — Inpatient Hospital Stay (HOSPITAL_COMMUNITY): Payer: BC Managed Care – PPO

## 2012-09-04 LAB — SEDIMENTATION RATE: Sed Rate: 140 mm/hr — ABNORMAL HIGH (ref 0–22)

## 2012-09-04 LAB — SYNOVIAL CELL COUNT + DIFF, W/ CRYSTALS
Crystals, Fluid: NONE SEEN
Lymphocytes-Synovial Fld: 1 % (ref 0–20)
Monocyte-Macrophage-Synovial Fluid: 7 % — ABNORMAL LOW (ref 50–90)
Neutrophil, Synovial: 92 % — ABNORMAL HIGH (ref 0–25)
WBC, Synovial: 44084 /mm3 — ABNORMAL HIGH (ref 0–200)

## 2012-09-04 LAB — GLUCOSE, CAPILLARY
Glucose-Capillary: 127 mg/dL — ABNORMAL HIGH (ref 70–99)
Glucose-Capillary: 144 mg/dL — ABNORMAL HIGH (ref 70–99)
Glucose-Capillary: 136 mg/dL — ABNORMAL HIGH (ref 70–99)

## 2012-09-04 LAB — CK: Total CK: 18 U/L (ref 7–177)

## 2012-09-04 MED ORDER — SODIUM CHLORIDE 0.9 % IJ SOLN
10.0000 mL | Freq: Two times a day (BID) | INTRAMUSCULAR | Status: DC
  Administered 2012-09-05: 10 mL

## 2012-09-04 MED ORDER — SODIUM CHLORIDE 0.9 % IJ SOLN
10.0000 mL | INTRAMUSCULAR | Status: DC | PRN
  Administered 2012-09-04 (×3): 20 mL
  Administered 2012-09-05: 10 mL
  Administered 2012-09-05 – 2012-09-07 (×4): 20 mL
  Administered 2012-09-08 – 2012-09-09 (×2): 10 mL

## 2012-09-04 MED ORDER — SODIUM CHLORIDE 0.9 % IV SOLN
500.0000 mg | Freq: Four times a day (QID) | INTRAVENOUS | Status: DC
  Administered 2012-09-04 – 2012-09-08 (×14): 500 mg via INTRAVENOUS
  Filled 2012-09-04 (×19): qty 500

## 2012-09-04 MED ORDER — SODIUM CHLORIDE 0.9 % IV SOLN
1500.0000 mg | Freq: Two times a day (BID) | INTRAVENOUS | Status: DC
  Administered 2012-09-04 – 2012-09-05 (×4): 1500 mg via INTRAVENOUS
  Filled 2012-09-04 (×4): qty 1500

## 2012-09-04 MED ORDER — IOHEXOL 180 MG/ML  SOLN
20.0000 mL | Freq: Once | INTRAMUSCULAR | Status: AC | PRN
  Administered 2012-09-04: 20 mL

## 2012-09-04 NOTE — Procedures (Signed)
The right knee was aspirated under fluoroscopy. Position confirmed with contrast injection. A single drop of normal appearing joint fluid was obtained. See report in radiology.

## 2012-09-04 NOTE — Progress Notes (Signed)
PT Cancellation Note  Patient Details Name: Amy Barnes MRN: [id number] DOB: [birth year]   Cancelled Treatment:    Reason Eval/Treat Not Completed: Patient at procedure or test/unavailable (Pt politely declined due to going to aspiration of right hip) Will follow as able.   Domanique Huesman M 09/04/2012, 11:52 AM  09/04/2012 Cephus Shelling, PT, DPT [phone]

## 2012-09-04 NOTE — Progress Notes (Addendum)
[facility] PROGRESS NOTE  AMANDEEP HOGSTON QMV:[id number] DOB: Jun 05, 1951 DOA: 08/28/2012 PCP: [name], MD  Assessment/Plan: Principal Problem:  *Right anterior knee pain Active Problems:  HYPOTHYROIDISM  DM type 2 with diabetic peripheral neuropathy  ANEMIA-NOS  BIPOLAR AFFECTIVE DISORDER, DEPRESSED  HYPERTENSION  ASTHMA  GERD  Obesity, morbid (more than 100 lbs over ideal weight or BMI > 40)     HPI :*  61 year old [place] female presented with knee pain . She has had severe right knee pain.Onset of symptoms was gradual starting a few weeks ago with rapidly worsening course since that time. The patient noted no past surgery on the left knee. Prior procedures on the knee include none. Patient has been treated conservatively with over-the-counter NSAIDs and activity modification. Patient currently rates pain in the knee at 10 out of 10 with activity. There is pain at night. Patient admitted for pain control. She was found to have acute right knee pain due to medial and lateral meniscal tear, she is unable to stand up or bear weight on it, orthopedics arthroscopic surgical intervention on 09/01/2012.  She had seen Dr. Lovell Sheehan in the office last week. Dr. Lovell Sheehan thought the pain was more likely coming from her knee but he ordered a lumbar MRI just to make sure that there was a significant lumbar spine pathology contributing to her pain. She had not gotten the MRI scan yet and was admitted for pain control. The patient's lumbar spine MRI showed results as above. Details hospital course as follows    HOSPITAL COURSE:  1. Right knee pain, status post, Right knee arthroscopy with partial medial and  lateral meniscectomy.  unable to bear weight on the right leg, in a patient with morbid obesity and MRI evidence of both lateral and medial meniscal tear along with joint effusion - orthopedics and neurosurgery were consulted, continue pain control, further recommendations per orthopedics,  for now patient is unable to stand up or bear weight at all on the right leg, She has been seen by Dr. Valentina Gu on 08-2012 who performed surgery on 09/01/2012 of note patient is on Lovenox for DVT prophylaxis however it is weight-based, she will continue with this for another 2 weeks. Also seen by Dr. Lovell Sheehan neurosurgeon as she has history of L-spine problem, L-spine MRI and Dr. Lovell Sheehan evaluation not consistent with any ongoing L-spine issues. Physical therapy recommended home health physical therapy as well as a rolling walker and bedside commode. Patient to followup with Dr. Sherlean Foot 10 days . Per Dr. Valentina Gu recommendations the patient had an MRI yesterday discussed results with radiology the patient has a posterior, leg abscess associated with myositis. I have already called Dr. Sherlean Foot and discussed the findings of the MRI. Dr. Sherlean Foot to discuss further plan of management. We'll start the patient on antibiotics. She may need ultrasound-guided aspiration , was started on antibiotics empirically.   2. Diabetes mellitus type 2 morbid obesity - during this hospitalization the patient was treated with pre-meal NovoLog along with sliding scale insulin, she will resume her home medications CBG (last 3)  3. History of hypertension. Continue ARB, monitor blood pressure.  4. Hypothyroidism. Continue home dose Synthroid.  5. Dyslipidemia continue home dose statin.  6. Morbid obesity. Outpatient follow with PCP and dietician.  7.anemia hemoglobin stable at around 9.0, continue Lovenox for DVT prophylaxis , hemoglobin has been trending down in the outpatient setting. Patient would benefit from a full GI workup to be arranged for by the primary care provider  8. Pain control patient on multiple narcotic medications, patient has been advised not to drive for at least a month  9. hyponatremia patient has been placed on fluid restriction and a dose of Lasix has been decreased she would need a repeat BMP in one week. Sodium  improved to 135, from 128.           HPI/Subjective: Complaining of a lot of pain  Objective: Filed Vitals:   09/03/12 1300 09/03/12 2113 09/04/12 0644 09/04/12 0800  BP: 139/65 120/67 124/64   Pulse: 109 64 75   Temp: 99.9 F (37.7 C) 98.7 F (37.1 C) 98.5 F (36.9 C)   TempSrc:  Oral    Resp: 18 18 18 18   Height:      Weight:      SpO2: 98% 96% 98%     Intake/Output Summary (Last 24 hours) at 09/04/12 0843 Last data filed at 09/04/12 0636  Gross per 24 hour  Intake   2412 ml  Output      0 ml  Net   2412 ml    Exam:  HENT:  Head: Atraumatic.  Nose: Nose normal.  Mouth/Throat: Oropharynx is clear and moist.  Eyes: Conjunctivae are normal. Pupils are equal, round, and reactive to light. No scleral icterus.  Neck: Neck supple. No tracheal deviation present.  Cardiovascular: Normal rate, regular rhythm, normal heart sounds and intact distal pulses.  Pulmonary/Chest: Effort normal and breath sounds normal. No respiratory distress.  Abdominal: Soft. Normal appearance and bowel sounds are normal. She exhibits no distension. There is no tenderness.  Musculoskeletal: She exhibits no edema and no tenderness.  Neurological: She is alert. No cranial nerve deficit.    Data Reviewed: Basic Metabolic Panel:  Lab 09/03/12 6295 09/02/12 0910 09/01/12 0610 08/28/12 2336  NA 135 128* 131* 135  K 4.2 4.3 4.5 3.7  CL 95* 92* 94* 97  CO2 29 29 30 28   GLUCOSE 115* 138* 107* 120*  BUN 7 10 13 15   CREATININE 0.57 0.70 0.65 0.64  CALCIUM 9.6 8.5 9.5 9.1  MG -- -- -- --  PHOS -- -- -- --    Liver Function Tests: No results found for this basename: AST:5,ALT:5,ALKPHOS:5,BILITOT:5,PROT:5,ALBUMIN:5 in the last 168 hours No results found for this basename: LIPASE:5,AMYLASE:5 in the last 168 hours No results found for this basename: AMMONIA:5 in the last 168 hours  CBC:  Lab 09/02/12 0645 09/01/12 0610 08/28/12 2336  WBC 7.7 7.6 9.0  NEUTROABS -- -- 6.3  HGB 9.0* 8.7*  9.7*  HCT 27.4* 27.0* 29.9*  MCV 82.8 82.6 82.4  PLT 395 377 387    Cardiac Enzymes: No results found for this basename: CKTOTAL:5,CKMB:5,CKMBINDEX:5,TROPONINI:5 in the last 168 hours BNP (last 3 results) No results found for this basename: PROBNP:3 in the last 8760 hours   CBG:  Lab 09/04/12 0659 09/03/12 2209 09/03/12 1626 09/03/12 1111 09/03/12 0614  GLUCAP 127* 131* 99 103* 132*    Recent Results (from the past 240 hour(s))  SURGICAL PCR SCREEN     Status: Abnormal   Collection Time   08/29/12  5:43 AM      Component Value Range Status Comment   MRSA, PCR POSITIVE (*) NEGATIVE Final    Staphylococcus aureus POSITIVE (*) NEGATIVE Final   URINE CULTURE     Status: Normal   Collection Time   08/30/12  3:11 PM      Component Value Range Status Comment   Specimen Description URINE, CATHETERIZED  Final    Special Requests NONE   Final    Culture  Setup Time 08/30/2012 15:34   Final    Colony Count >=100,000 COLONIES/ML   Final    Culture KLEBSIELLA PNEUMONIAE   Final    Report Status 09/01/2012 FINAL   Final    Organism ID, Bacteria KLEBSIELLA PNEUMONIAE   Final      Studies: Mr Lumbar Spine W Wo Contrast  08/31/2012  *RADIOLOGY REPORT*  Clinical Data: Severe right leg and knee pain.  Lumbar spine surgery 01/08/2012.  MRI LUMBAR SPINE WITHOUT AND WITH CONTRAST  Technique:  Multiplanar and multiecho pulse sequences of the lumbar spine were obtained without and with intravenous contrast.  Contrast: 20mL MULTIHANCE GADOBENATE DIMEGLUMINE 529 MG/ML IV SOLN  Comparison:  None available.  Findings:  Normal signal is present in the conus medullaris which terminates at L1-2.  Patient is status post L3-4 PLIF.  The marrow signal, vertebral body heights, and alignment are otherwise normal. Limited imaging of the abdomen is unremarkable.  L1-2:  Negative.  L2-3:  Mild facet hypertrophy is present.  There is slight disc bulging.  No significant stenosis is present.  L3-4:  The patient is  status post fusion.  Bilateral foraminotomies were performed.  The central canal and foramina are widely patent. The disc spacers are well positioned.  L4-5:  A mild broad-based disc herniation is present.  Facet hypertrophy is noted bilaterally.  This leads to mild distortion of the central canal without significant stenosis.  L5-S1:  A rightward disc herniation potentially contacts the exiting L5 nerve root within the foramen.  IMPRESSION:  1.  Status post L3-4 PLIF without evidence for residual or recurrent stenosis. 2.  Rightward disc herniation at L5-S1 potentially contacts the exiting right L5 nerve root. 3.  Slight disc bulging at to L2-3 and L4-5 without significant stenosis at either level.   Original Report Authenticated By: Marin Roberts, M.D.    Dg Pelvis Portable  09/01/2012  *RADIOLOGY REPORT*  Clinical Data: Right hip pain.  PORTABLE PELVIS  Comparison: None  Findings: There are mild right hip joint degenerative changes but no acute bony findings or plain film evidence of avascular necrosis.  Pubic symphysis and SI joints are intact.  A left hip prosthesis is noted along with lumbar fusion hardware.  IMPRESSION: Degenerative changes but no acute bony findings.   Original Report Authenticated By: Rudie Meyer, M.D.    Dg Chest Port 1 View  09/01/2012  *RADIOLOGY REPORT*  Clinical Data: Status post central line placement.  PORTABLE CHEST - 1 VIEW  Comparison: Chest radiograph 08/30/2012  Findings: A right IJ central venous catheter is in place.  The distal tip projects over the distal superior vena cava.  Heart and mediastinal contours are stable.  Pulmonary vascular prominence appears similar to recent prior study.  Lung volumes are low.  No focal airspace disease or pleural effusion.  Postsurgical changes of the lower cervical spine.  Negative for pneumothorax.  No subcutaneous gas in the chest wall.  IMPRESSION:  Satisfactory position of right IJ central venous catheter.  No complicating  feature.   Original Report Authenticated By: Britta Mccreedy, M.D.    Dg Chest Port 1 View  08/30/2012  *RADIOLOGY REPORT*  Clinical Data: Cough and weakness.  PORTABLE CHEST - 1 VIEW  Comparison: 01/08/2012 and 01/02/2012.  Findings: Central pulmonary vascular prominence without pulmonary edema.  No segmental infiltrate or gross pneumothorax.  Calcified slightly tortuous aorta.  Heart size top normal.  Postsurgical changes lower cervical spine.  IMPRESSION: No acute abnormality.  If there is a persistent unexplained cough, follow-up two-view exam may be considered.   Original Report Authenticated By: Lacy Duverney, M.D.    Dg Knee Right Port  08/29/2012  *RADIOLOGY REPORT*  Clinical Data: Severe right knee pain.  PORTABLE RIGHT KNEE - 1-2 VIEW  Comparison: None.  Findings: There is marked narrowing of the medial compartment with small marginal osteophytes. Slight sclerosis of the medial compartment.  Lateral and patellofemoral compartments appear essentially normal.  No appreciable joint effusion.  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   Original Report Authenticated By: Francene Boyers, M.D.     Scheduled Meds:   . aspirin EC  325 mg Oral Q breakfast  . [EXPIRED] Chlorhexidine Gluconate Cloth  6 each Topical Q0600  . docusate sodium  300 mg Oral QHS  . enoxaparin (LOVENOX) injection  80 mg Subcutaneous Daily  . insulin aspart  0-15 Units Subcutaneous TID WC  . insulin aspart  0-5 Units Subcutaneous QHS  . insulin aspart  4 Units Subcutaneous TID WC  . lamoTRIgine  200 mg Oral Daily  . levothyroxine  150 mcg Oral QAC breakfast  . LORazepam  0.5 mg Intravenous Once  . losartan  50 mg Oral Daily  . montelukast  10 mg Oral QHS  . morphine  30 mg Oral Q12H  . [EXPIRED] mupirocin ointment  1 application Nasal BID  . Oxcarbazepine  300 mg Oral Daily  . OXcarbazepine  600 mg Oral QHS  . simvastatin  40 mg Oral QPM  . sodium chloride  3 mL Intravenous Q12H    Continuous Infusions:   Principal Problem:  *Right anterior knee pain Active Problems:  HYPOTHYROIDISM  DM type 2 with diabetic peripheral neuropathy  ANEMIA-NOS  BIPOLAR AFFECTIVE DISORDER, DEPRESSED  HYPERTENSION  ASTHMA  GERD  Obesity, morbid (more than 100 lbs over ideal weight or BMI > 40)    Time spent: 40 minutes   [facility]  [facility] Pager [phone]. If 8PM-8AM, please contact night-coverage at [email or web address], password [facility] 09/04/2012, 8:43 AM  LOS: 7 days

## 2012-09-04 NOTE — CV Procedure (Signed)
US guided fluid aspiration of deep fluid collection adjacent to the femur.  I aspirated 35 cc of purulent sanguinous material from a location approximately 20 cm above the patella.  Sent for gram stain and cultures.  No complications.

## 2012-09-04 NOTE — Progress Notes (Signed)
ANTIBIOTIC CONSULT NOTE - INITIAL  Pharmacy Consult for Vancomycin and Primaxin Indication: septic arthritis/abscess  Allergies  Allergen Reactions  . Cephalexin Hives  . Hydrocodone   . Risperidone And Related   . Seroquel (Quetiapine Fumarate)   . Sulfonamide Derivatives     REACTION: Rash    Patient Measurements: Height: 5\' 6"  (167.6 cm) Weight: 335 lb 12.2 oz (152.3 kg) IBW/kg (Calculated) : 59.3    Vital Signs: Temp: 98.5 F (36.9 C) (12/07 0644) BP: 124/64 mmHg (12/07 0644) Pulse Rate: 75  (12/07 0644) Intake/Output from previous day: 12/06 0701 - 12/07 0700 In: 2412 [I.V.:2412] Out: -  Intake/Output from this shift: Total I/O In: 20 [I.V.:20] Out: -   Labs:  Basename 09/03/12 1015 09/02/12 0910 09/02/12 0645  WBC -- -- 7.7  HGB -- -- 9.0*  PLT -- -- 395  LABCREA -- -- --  CREATININE 0.57 0.70 --   Estimated Creatinine Clearance: 113.9 ml/min (by C-G formula based on Cr of 0.57). No results found for this basename: VANCOTROUGH:2,VANCOPEAK:2,VANCORANDOM:2,GENTTROUGH:2,GENTPEAK:2,GENTRANDOM:2,TOBRATROUGH:2,TOBRAPEAK:2,TOBRARND:2,AMIKACINPEAK:2,AMIKACINTROU:2,AMIKACIN:2, in the last 72 hours   Microbiology: Recent Results (from the past 720 hour(s))  SURGICAL PCR SCREEN     Status: Abnormal   Collection Time   08/29/12  5:43 AM      Component Value Range Status Comment   MRSA, PCR POSITIVE (*) NEGATIVE Final    Staphylococcus aureus POSITIVE (*) NEGATIVE Final   URINE CULTURE     Status: Normal   Collection Time   08/30/12  3:11 PM      Component Value Range Status Comment   Specimen Description URINE, CATHETERIZED   Final    Special Requests NONE   Final    Culture  Setup Time 08/30/2012 15:34   Final    Colony Count >=100,000 COLONIES/ML   Final    Culture KLEBSIELLA PNEUMONIAE   Final    Report Status 09/01/2012 FINAL   Final    Organism ID, Bacteria KLEBSIELLA PNEUMONIAE   Final     Medical History: Past Medical History  Diagnosis Date  .  Anemia   . Osteoarthritis   . Asthma   . Diabetes mellitus   . Hypothyroid   . GERD (gastroesophageal reflux disease)   . Allergic rhinitis   . Hypertension   . Bipolar disorder     takes Synthroid meds for Bipolar  . Shortness of breath   . Sleep apnea     CPAP    Medications:  Scheduled:    . aspirin EC  325 mg Oral Q breakfast  . [EXPIRED] Chlorhexidine Gluconate Cloth  6 each Topical Q0600  . docusate sodium  300 mg Oral QHS  . enoxaparin (LOVENOX) injection  80 mg Subcutaneous Daily  . insulin aspart  0-15 Units Subcutaneous TID WC  . insulin aspart  0-5 Units Subcutaneous QHS  . insulin aspart  4 Units Subcutaneous TID WC  . lamoTRIgine  200 mg Oral Daily  . levothyroxine  150 mcg Oral QAC breakfast  . LORazepam  0.5 mg Intravenous Once  . losartan  50 mg Oral Daily  . montelukast  10 mg Oral QHS  . morphine  30 mg Oral Q12H  . [EXPIRED] mupirocin ointment  1 application Nasal BID  . Oxcarbazepine  300 mg Oral Daily  . OXcarbazepine  600 mg Oral QHS  . simvastatin  40 mg Oral QPM  . sodium chloride  10-40 mL Intracatheter Q12H  . sodium chloride  3 mL Intravenous Q12H   Assessment: 61  yr old female s/p right knee arthroscopy and lateral meniscectomy, with leg abscess associated with myositis on vancomycin and primaxin.    Goal of Therapy:  Vancomycin trough level=10-15  Plan:  Start Vancomycin 1500mg  IV q12hrs and Primaxin 500mg  IV q6hrs. F/u renal function, culture data, vanc trough levels if indicated.   Wendie Simmer, PharmD, BCPS Clinical Pharmacist  Pager: [phone]    [name] 09/04/2012,9:16 AM

## 2012-09-05 LAB — COMPREHENSIVE METABOLIC PANEL
ALT: 16 U/L (ref 0–35)
AST: 17 U/L (ref 0–37)
Albumin: 2.2 g/dL — ABNORMAL LOW (ref 3.5–5.2)
Alkaline Phosphatase: 110 U/L (ref 39–117)
BUN: 8 mg/dL (ref 6–23)
CO2: 29 mEq/L (ref 19–32)
Calcium: 8.9 mg/dL (ref 8.4–10.5)
Chloride: 101 mEq/L (ref 96–112)
Creatinine, Ser: 0.52 mg/dL (ref 0.50–1.10)
GFR calc Af Amer: >90 mL/min (ref >90)
GFR calc non Af Amer: >90 mL/min (ref >90)
Glucose, Bld: 149 mg/dL — ABNORMAL HIGH (ref 70–99)
Potassium: 3.8 mEq/L (ref 3.5–5.1)
Sodium: 137 mEq/L (ref 135–145)
Total Bilirubin: 0.2 mg/dL — ABNORMAL LOW (ref 0.3–1.2)
Total Protein: 6.3 g/dL (ref 6.0–8.3)

## 2012-09-05 LAB — GLUCOSE, CAPILLARY
Glucose-Capillary: 114 mg/dL — ABNORMAL HIGH (ref 70–99)
Glucose-Capillary: 112 mg/dL — ABNORMAL HIGH (ref 70–99)
Glucose-Capillary: 107 mg/dL — ABNORMAL HIGH (ref 70–99)
Glucose-Capillary: 100 mg/dL — ABNORMAL HIGH (ref 70–99)
Glucose-Capillary: 122 mg/dL — ABNORMAL HIGH (ref 70–99)

## 2012-09-05 LAB — CBC
HCT: 25 % — ABNORMAL LOW (ref 36.0–46.0)
Hemoglobin: 8.1 g/dL — ABNORMAL LOW (ref 12.0–15.0)
MCH: 26.8 pg (ref 26.0–34.0)
MCHC: 32.4 g/dL (ref 30.0–36.0)
MCV: 82.8 fL (ref 78.0–100.0)
Platelets: 401 10*3/uL — ABNORMAL HIGH (ref 150–400)
RBC: 3.02 MIL/uL — ABNORMAL LOW (ref 3.87–5.11)
RDW: 14.7 % (ref 11.5–15.5)
WBC: 6.8 10*3/uL (ref 4.0–10.5)

## 2012-09-05 LAB — VANCOMYCIN, TROUGH: Vancomycin Tr: 10.5 ug/mL (ref 10.0–20.0)

## 2012-09-05 MED ORDER — MUPIROCIN 2 % EX OINT
1.0000 "application " | TOPICAL_OINTMENT | Freq: Two times a day (BID) | CUTANEOUS | Status: AC
  Administered 2012-09-05 – 2012-09-08 (×8): 1 via NASAL
  Filled 2012-09-05: qty 22

## 2012-09-05 MED ORDER — VANCOMYCIN HCL 10 G IV SOLR
1500.0000 mg | Freq: Three times a day (TID) | INTRAVENOUS | Status: DC
  Administered 2012-09-06 – 2012-09-10 (×14): 1500 mg via INTRAVENOUS
  Filled 2012-09-05 (×16): qty 1500

## 2012-09-05 MED ORDER — CHLORHEXIDINE GLUCONATE CLOTH 2 % EX PADS
6.0000 | MEDICATED_PAD | Freq: Every day | CUTANEOUS | Status: AC
  Administered 2012-09-05 – 2012-09-08 (×4): 6 via TOPICAL

## 2012-09-05 NOTE — Progress Notes (Signed)
On Call Ortho Note  I have been covering for Dr. Sherlean Foot this weekend and have been informed of the developments in evaluation and management within the last two hours by Dr. Sherlean Foot himself. Pager, phone, and answering service are working so unclear as to reason while communications have circumventing me entirely.  Reviewed history, labs, imaging, and discussed in detail with Dr. Sherlean Foot, offering to proceed with I&D in the event confirmatory change in physical examination. If no change or improvement post antibiotics, which appear to have been started empirically in the morning before tapping the collection, then plan is for I&D tomorrow.  No bacteria on tap and no evidence of knee infection within joint by aspiration. Although drainage could conceivably be accomplished percutaneously with continued abx for definitive treatment, surgery offers chance at direct visual evaluation and removal of any loculations if present.   Despite being off, Dr. Sherlean Foot has chosen to re-examine the patient personally to gauge changes in the examination if present, and is in route to hospital again now.  Standing by to assist with management as directed based off findings.  Myrene Galas, MD [facility] [phone] (p)

## 2012-09-05 NOTE — Progress Notes (Signed)
Dr Sherlean Foot called per MD  request UJ:[id number]. (PA called first and states Dr Sherlean Foot on call).  Con't activity /PT as before abcess.

## 2012-09-05 NOTE — Progress Notes (Signed)
Physical Therapy Treatment Patient Details Name: Amy Barnes MRN: [id number] DOB: 1951/06/05 Today's Date: 09/05/2012 Time: 1320-1330 PT Time Calculation (min): 10 min  PT Assessment / Plan / Recommendation Comments on Treatment Session  Pt not progressing towards goals at this time. Limited activity tolerance today due to pain.    Follow Up Recommendations  Home health PT           Equipment Recommendations  None recommended by OT       Frequency Min 5X/week   Plan Discharge plan remains appropriate;Frequency remains appropriate    Precautions / Restrictions Precautions Precautions: Fall Restrictions RLE Weight Bearing: Weight bearing as tolerated       Mobility  Bed Mobility Bed Mobility: Rolling Left Rolling Left: 4: Min assist;With rail Details for Bed Mobility Assistance: Pt able to roll to left side with min assitance and rail assist. Donned pt's neoprene brace for incre knee support for out of bed mobility. Pt crying profusly and hollering out in pain with donning of brace. Out of bed held at this time until pain better controlled for incr activity tolerance. Pt unable to assist with lifting her right leg to don brace, total assist required with spouse helping to position the brace. Brace removed without lifting the right leg due to pt reporting it was not comfortable. RN in room and was giving IV pain meds and is aware of pt's pain.                     PT Goals Acute Rehab PT Goals PT Goal: Supine/Side to Sit - Progress: Not progressing PT Goal: Sit to Supine/Side - Progress: Not progressing PT Goal: Sit to Stand - Progress: Not progressing PT Goal: Stand to Sit - Progress: Not progressing PT Goal: Ambulate - Progress: Not progressing  Visit Information  Last PT Received On: 09/05/12 Assistance Needed: +1    Subjective Data  Subjective: Reports 10/10 right knee pain and not being able to move leg.   Cognition  Overall Cognitive Status: Appears within  functional limits for tasks assessed/performed Arousal/Alertness: Awake/alert Orientation Level: Appears intact for tasks assessed Behavior During Session: [facility] for tasks performed       End of Session PT - End of Session Activity Tolerance: Patient limited by pain Patient left: in bed;with call bell/phone within reach;with family/visitor present;with nursing in room Nurse Communication: Patient requests pain meds   GP     Sallyanne Kuster 09/05/2012, 1:54 PM  Sallyanne Kuster, PTA Office- [phone]

## 2012-09-05 NOTE — Progress Notes (Signed)
Patient ID: Amy Barnes, female   DOB: Aug 29, 1951, 61 y.o.   MRN: [id number]  Hx:  I have been called by the hospitalists and nurses several times over the weekend about Amy Barnes and have told them that I am NOT on call.  They have been calling my cell phone and leaving messages.  The protocol for call is for them to call the MD on call for the group after 5pm on weekdays and all day on weekends.  This protocol was NOT followed.  I told them with regards to the clinical situation that I had operated on the meniscus tear for which I was consulted.  I told them both pre-operatively and post-operatively that this pain was not due to the meniscus tear.  I told them Friday they should keep looking for a primary source.  They finally discovered the source was a deep thigh abscess yesterday afternoon and I was left a message, yet again not on call and from the hospitalists, that the fluid was purulent.  Upon picking up the message when I turned my phone on, I called my MD on call and decided to try to figure out the next step to care for this patient.  I have just evaluated the patient, again not on call, and she is afebrile with no systemic signs of infection and even less pain that I have seen in 7 days.  The family completely understands the clinical picture from my vantage point and understand our next steps.  Our communication has been excellent throughout the hospital stay (doctor/patient/patient family).  Px:  The patient is afebrile with normal vitals and normal labs other than the purulent fluid from the abcess which has not grown anything and no gram stain results.  I have looked at the MRI, talked with the radiologist personally and gone through the studies with Dr. Carola Frost as well.  Dx:  R Leg Abcess; primary cause of unknown origin.  Rx:  After consulting with the radiologist and two other orthopaedists, I feel the next step would be irrigation and debridement of the abcess in the operating room  tomorrow.  The patient has not been npo and is not systemically ill.  This will be scheduled at noon tomorrow and in Dr. Magdalene Patricia room.  I will assist as my surgical schedule finishes about that time.  I have made it clear to the nursing staff that I am NOT on call and will not be available to receive calls for the remainder of the day regarding this situation.  Dannielle Huh, M.D.

## 2012-09-05 NOTE — Progress Notes (Signed)
Patient ID: Amy Barnes, female   DOB: 08-17-1951, 61 y.o.   MRN: [id number]  I have reviewed the MRI  This is outside the scope of general surgery.  She needs orth0pedic evaluation.  If Dr.Lucy is not comfortable with this, he may need to consult another orthopedic surgeon

## 2012-09-05 NOTE — Progress Notes (Signed)
ANTIBIOTIC CONSULT NOTE - FOLLOW UP  Pharmacy Consult for vancomycin Indication: septic arthritis and abscess  Labs:  [facility] 09/05/12 0525 09/03/12 1015  WBC 6.8 --  HGB 8.1* --  PLT 401* --  LABCREA -- --  CREATININE 0.52 0.57   Estimated Creatinine Clearance: 113.9 ml/min (by C-G formula based on Cr of 0.52).  Basename 09/05/12 2105  VANCOTROUGH 10.5  VANCOPEAK --  VANCORANDOM --  GENTTROUGH --  GENTPEAK --  GENTRANDOM --  TOBRATROUGH --  TOBRAPEAK --  TOBRARND --  AMIKACINPEAK --  AMIKACINTROU --  AMIKACIN --     Microbiology: Recent Results (from the past 720 hour(s))  SURGICAL PCR SCREEN     Status: Abnormal   Collection Time   08/29/12  5:43 AM      Component Value Range Status Comment   MRSA, PCR POSITIVE (*) NEGATIVE Final    Staphylococcus aureus POSITIVE (*) NEGATIVE Final   URINE CULTURE     Status: Normal   Collection Time   08/30/12  3:11 PM      Component Value Range Status Comment   Specimen Description URINE, CATHETERIZED   Final    Special Requests NONE   Final    Culture  Setup Time 08/30/2012 15:34   Final    Colony Count >=100,000 COLONIES/ML   Final    Culture KLEBSIELLA PNEUMONIAE   Final    Report Status 09/01/2012 FINAL   Final    Organism ID, Bacteria KLEBSIELLA PNEUMONIAE   Final   BODY FLUID CULTURE     Status: Normal (Preliminary result)   Collection Time   09/04/12  2:30 PM      Component Value Range Status Comment   Specimen Description SYNOVIAL RIGHT KNEE   Final    Special Requests NONE   Final    Gram Stain     Final    Value: WBC PRESENT,BOTH PMN AND MONONUCLEAR     NO ORGANISMS SEEN   Culture Culture reincubated for better growth   Final    Report Status PENDING   Incomplete     Anti-infectives     Start     Dose/Rate Route Frequency Ordered Stop   09/06/12 0600   vancomycin (VANCOCIN) 1,500 mg in sodium chloride 0.9 % 500 mL IVPB        1,500 mg 250 mL/hr over 120 Minutes Intravenous Every 8 hours 09/05/12 2313      09/04/12 1200   imipenem-cilastatin (PRIMAXIN) 500 mg in sodium chloride 0.9 % 100 mL IVPB        500 mg 200 mL/hr over 30 Minutes Intravenous 4 times per day 09/04/12 0923     09/04/12 1000   vancomycin (VANCOCIN) 1,500 mg in sodium chloride 0.9 % 500 mL IVPB  Status:  Discontinued        1,500 mg 250 mL/hr over 120 Minutes Intravenous Every 12 hours 09/04/12 0923 09/05/12 2312   09/02/12 0000   vancomycin (VANCOCIN) IVPB 1000 mg/200 mL premix        1,000 mg 200 mL/hr over 60 Minutes Intravenous Every 12 hours 09/01/12 1642 09/02/12 0102   09/01/12 1700   levofloxacin (LEVAQUIN) tablet 500 mg        500 mg Oral Every 24 hr x 2 09/01/12 0915 09/02/12 1634   09/01/12 1230   vancomycin (VANCOCIN) 1,000 mg in sodium chloride 0.9 % 250 mL IVPB  Status:  Discontinued        1,000 mg 250 mL/hr over  60 Minutes Intravenous  Once 09/01/12 1219 09/01/12 1226   09/01/12 1230   vancomycin (VANCOCIN) 2,000 mg in sodium chloride 0.9 % 500 mL IVPB        2,000 mg 250 mL/hr over 120 Minutes Intravenous To Surgery 09/01/12 1226 09/01/12 1208   08/30/12 1700   levofloxacin (LEVAQUIN) IVPB 500 mg  Status:  Discontinued        500 mg 100 mL/hr over 60 Minutes Intravenous Every 24 hours 08/30/12 1649 09/01/12 0915          Assessment: 61yo female subtherapeutic on vancomycin with initial dosing for septic arthritis.  Now with new abscess, going to OR tomorrow for I&D.  Goal of Therapy:  Vancomycin trough level 15-20 mcg/ml  Plan:  Will increase vancomycin to 1500mg  IV Q8H for calculated trough of ~18.  Colleen Can PharmD BCPS 09/05/2012,11:14 PM

## 2012-09-05 NOTE — Progress Notes (Signed)
Dc lovenox for surgery tomorrow

## 2012-09-05 NOTE — Progress Notes (Signed)
[facility] PROGRESS NOTE  Amy Barnes OZH:[id number] DOB: 1951-01-20 DOA: 08/28/2012 PCP: [name], MD  Assessment/Plan: Principal Problem:  *Right anterior knee pain Active Problems:  HYPOTHYROIDISM  DM type 2 with diabetic peripheral neuropathy  ANEMIA-NOS  BIPOLAR AFFECTIVE DISORDER, DEPRESSED  HYPERTENSION  ASTHMA  GERD  Obesity, morbid (more than 100 lbs over ideal weight or BMI > 40)    HPI :*  61 year old Amy Barnes female presented with knee pain . She has had severe right knee pain.Onset of symptoms was gradual starting a few weeks ago with rapidly worsening course since that time. The patient noted no past surgery on the left knee. Prior procedures on the knee include none. Patient has been treated conservatively with over-the-counter NSAIDs and activity modification. Patient currently rates pain in the knee at 10 out of 10 with activity. There is pain at night. Patient admitted for pain control. She was found to have acute right knee pain due to medial and lateral meniscal tear, she is unable to stand up or bear weight on it, orthopedics arthroscopic surgical intervention on 09/01/2012.  She had seen Dr. Lovell Sheehan in the office last week. Dr. Lovell Sheehan thought the pain was more likely coming from her knee but he ordered a lumbar MRI just to make sure that there was a significant lumbar spine pathology contributing to her pain. She had not gotten the MRI scan yet and was admitted for pain control. The patient's lumbar spine MRI showed results as above. Details hospital course as follows    HOSPITAL COURSE:  1. Right knee pain, status post, Right knee arthroscopy with partial medial and  lateral meniscectomy.  unable to bear weight on the right leg, in a patient with morbid obesity and MRI evidence of both lateral and medial meniscal tear along with joint effusion - orthopedics and neurosurgery were consulted, continue pain control, further recommendations per orthopedics, for  now patient is unable to stand up or bear weight at all on the right leg, She has been seen by Dr. Valentina Gu on 08-2012 who performed surgery on 09/01/2012 of note patient is on Lovenox for DVT prophylaxis however it is weight-based, she will continue with this for another 2 weeks. Also seen by Dr. Lovell Sheehan neurosurgeon as she has history of L-spine problem, L-spine MRI and Dr. Lovell Sheehan evaluation not consistent with any ongoing L-spine issues. Physical therapy recommended home health physical therapy as well as a rolling walker and bedside commode. Patient to followup with Dr. Sherlean Foot 10 days .  Per Dr. Valentina Gu recommendations the patient had an MRI yesterday discussed results with radiology the patient has a posterior, leg abscess associated with myositis. I have already called Dr. Sherlean Foot multiple times and discussed the findings of the MRI. Dr. Sherlean Foot , she status post ultrasound-guided aspiration 35 cc of purulent material was removed, she has been started on empiric antibiotics. I left message for Dr. Sherlean Foot.Also consulted Dr Magnus Ivan  2. Diabetes mellitus type 2 morbid obesity - during this hospitalization the patient was treated with pre-meal NovoLog along with sliding scale insulin, she will resume her home medications CBG (last 3)   3. History of hypertension. Continue ARB, monitor blood pressure.  4. Hypothyroidism. Continue home dose Synthroid.  5. Dyslipidemia continue home dose statin.  6. Morbid obesity. Outpatient follow with PCP and dietician.  7.anemia hemoglobin stable at around 9.0, continue Lovenox for DVT prophylaxis , hemoglobin has been trending down in the outpatient setting. Patient would benefit from a full GI workup to be  arranged for by the primary care provider  8. Pain control patient on multiple narcotic medications, patient has been advised not to drive for at least a month  9. hyponatremia patient has been placed on fluid restriction and a dose of Lasix has been decreased she would need  a repeat BMP in one week. Sodium improved to 135, from 128.        HPI/Subjective:  Complaining of a lot of pain   Objective: Filed Vitals:   09/04/12 1435 09/04/12 1552 09/04/12 2006 09/05/12 0556  BP: 130/54 138/59 127/50 100/40  Pulse:  101 90 81  Temp:  100.7 F (38.2 C) 97.7 F (36.5 C) 98 F (36.7 C)  TempSrc:   Oral Oral  Resp:  18 20 18   Height:      Weight:      SpO2:  98% 98% 95%    Intake/Output Summary (Last 24 hours) at 09/05/12 0749 Last data filed at 09/04/12 1846  Gross per 24 hour  Intake    280 ml  Output      0 ml  Net    280 ml    Exam:  HENT:  Head: Atraumatic.  Nose: Nose normal.  Mouth/Throat: Oropharynx is clear and moist.  Eyes: Conjunctivae are normal. Pupils are equal, round, and reactive to light. No scleral icterus.  Neck: Neck supple. No tracheal deviation present.  Cardiovascular: Normal rate, regular rhythm, normal heart sounds and intact distal pulses.  Pulmonary/Chest: Effort normal and breath sounds normal. No respiratory distress.  Abdominal: Soft. Normal appearance and bowel sounds are normal. She exhibits no distension. There is no tenderness.  Musculoskeletal: She exhibits no edema and no tenderness.  Neurological: She is alert. No cranial nerve deficit.    Data Reviewed: Basic Metabolic Panel:  Lab 09/05/12 9604 09/03/12 1015 09/02/12 0910 09/01/12 0610  NA 137 135 128* 131*  K 3.8 4.2 4.3 4.5  CL 101 95* 92* 94*  CO2 29 29 29 30   GLUCOSE 149* 115* 138* 107*  BUN 8 7 10 13   CREATININE 0.52 0.57 0.70 0.65  CALCIUM 8.9 9.6 8.5 9.5  MG -- -- -- --  PHOS -- -- -- --    Liver Function Tests:  Lab 09/05/12 0525  AST 17  ALT 16  ALKPHOS 110  BILITOT 0.2*  PROT 6.3  ALBUMIN 2.2*   No results found for this basename: LIPASE:5,AMYLASE:5 in the last 168 hours No results found for this basename: AMMONIA:5 in the last 168 hours  CBC:  Lab 09/05/12 0525 09/02/12 0645 09/01/12 0610  WBC 6.8 7.7 7.6  NEUTROABS  -- -- --  HGB 8.1* 9.0* 8.7*  HCT 25.0* 27.4* 27.0*  MCV 82.8 82.8 82.6  PLT 401* 395 377    Cardiac Enzymes:  Lab 09/04/12 1000  CKTOTAL 18  CKMB --  CKMBINDEX --  TROPONINI --   BNP (last 3 results) No results found for this basename: PROBNP:3 in the last 8760 hours   CBG:  Lab 09/05/12 0719 09/04/12 2143 09/04/12 1613 09/04/12 1106 09/04/12 0659  GLUCAP 112* 114* 136* 144* 127*    Recent Results (from the past 240 hour(s))  SURGICAL PCR SCREEN     Status: Abnormal   Collection Time   08/29/12  5:43 AM      Component Value Range Status Comment   MRSA, PCR POSITIVE (*) NEGATIVE Final    Staphylococcus aureus POSITIVE (*) NEGATIVE Final   URINE CULTURE     Status: Normal  Collection Time   08/30/12  3:11 PM      Component Value Range Status Comment   Specimen Description URINE, CATHETERIZED   Final    Special Requests NONE   Final    Culture  Setup Time 08/30/2012 15:34   Final    Colony Count >=100,000 COLONIES/ML   Final    Culture KLEBSIELLA PNEUMONIAE   Final    Report Status 09/01/2012 FINAL   Final    Organism ID, Bacteria KLEBSIELLA PNEUMONIAE   Final   BODY FLUID CULTURE     Status: Normal (Preliminary result)   Collection Time   09/04/12  2:30 PM      Component Value Range Status Comment   Specimen Description SYNOVIAL RIGHT KNEE   Final    Special Requests NONE   Final    Gram Stain     Final    Value: WBC PRESENT,BOTH PMN AND MONONUCLEAR     NO ORGANISMS SEEN   Culture PENDING   Incomplete    Report Status PENDING   Incomplete      Studies: Mr Lumbar Spine W Wo Contrast  08/31/2012  *RADIOLOGY REPORT*  Clinical Data: Severe right leg and knee pain.  Lumbar spine surgery 01/08/2012.  MRI LUMBAR SPINE WITHOUT AND WITH CONTRAST  Technique:  Multiplanar and multiecho pulse sequences of the lumbar spine were obtained without and with intravenous contrast.  Contrast: 20mL MULTIHANCE GADOBENATE DIMEGLUMINE 529 MG/ML IV SOLN  Comparison:  None available.   Findings:  Normal signal is present in the conus medullaris which terminates at L1-2.  Patient is status post L3-4 PLIF.  The marrow signal, vertebral body heights, and alignment are otherwise normal. Limited imaging of the abdomen is unremarkable.  L1-2:  Negative.  L2-3:  Mild facet hypertrophy is present.  There is slight disc bulging.  No significant stenosis is present.  L3-4:  The patient is status post fusion.  Bilateral foraminotomies were performed.  The central canal and foramina are widely patent. The disc spacers are well positioned.  L4-5:  A mild broad-based disc herniation is present.  Facet hypertrophy is noted bilaterally.  This leads to mild distortion of the central canal without significant stenosis.  L5-S1:  A rightward disc herniation potentially contacts the exiting L5 nerve root within the foramen.  IMPRESSION:  1.  Status post L3-4 PLIF without evidence for residual or recurrent stenosis. 2.  Rightward disc herniation at L5-S1 potentially contacts the exiting right L5 nerve root. 3.  Slight disc bulging at to L2-3 and L4-5 without significant stenosis at either level.   Original Report Authenticated By: Marin Roberts, M.D.    Dg Pelvis Portable  09/01/2012  *RADIOLOGY REPORT*  Clinical Data: Right hip pain.  PORTABLE PELVIS  Comparison: None  Findings: There are mild right hip joint degenerative changes but no acute bony findings or plain film evidence of avascular necrosis.  Pubic symphysis and SI joints are intact.  A left hip prosthesis is noted along with lumbar fusion hardware.  IMPRESSION: Degenerative changes but no acute bony findings.   Original Report Authenticated By: Rudie Meyer, M.D.    Mr Femur Right W Wo Contrast  09/04/2012  *RADIOLOGY REPORT*  Clinical Data: Severe right leg pain.  Recent patellar surgery.  MRI RIGHT THIGH WITHOUT CONTRAST  Technique:  Multiplanar, multisequence MR imaging of the right thigh was performed.  No intravenous contrast was  administered.  Comparison:   None  Findings:  There is a large complex the  joint effusion with thick irregular synovial enhancement worrisome for septic arthritis.  No definite findings for osteomyelitis.  Extending out from a suprapatellar bursa and along the anterior and lateral aspect of the femur is an elongated abscess.  This extends approximately halfway up the femur measuring a total of approximately 20 cm in length.  There is severe surrounding myositis involving the thigh musculature extending all way up to the hip.  No MR findings to suggest osteomyelitis.  There is surrounding cellulitis.  IMPRESSION:  1.  MR findings most consistent with septic arthritis with dissecting abscess up along the anterior and lateral aspect of the femur with severe surrounding myositis extending all way up to the hip. 2.  No findings to suggest osteomyelitis.   Original Report Authenticated By: Rudie Meyer, M.D.    US Aspiration  09/04/2012  *RADIOLOGY REPORT*  Clinical Data: Deep tissue fluid collection about the right femur. Pain.  Fevers.  ULTRASOUND GUIDED ASPIRATION.  Comparison:  Right femur MRI 09/03/2012.  Sedation:  None  Procedure:  The procedure, risks, benefits, and alternatives were explained to the patient.  Questions regarding the procedure were encouraged and answered.  The patient understands and consents to the procedure.  The anterior right thigh was prepped with betadine in a sterile fashion, and a sterile drape was applied covering the operative field.  A sterile gown and sterile gloves were used for the procedure. Local anesthesia was provided with 1% Lidocaine.  The fluid collection was identified anterior and lateral to the right femur.  Following local anesthesia, a 20 gauge needle was advanced into the fluid collection.  A 30 ml syringe was filled with somewhat purulent and sanguinous material.  Using a 10 ml syringe I was able to obtain an additional 5 mm of fluid from a slightly more lateral  aspect of the collection.  The needle was then removed.  Hemostasis was achieved.  Complications: None.  IMPRESSION:  1.  Technically successful ultrasound guided aspiration of a complex fluid collection adjacent to the right femur. Approximately 35 ml of purulent sanguinous fluid was aspirated. 2.  The fluid was sent for cell count, Gram stain and culture.   Original Report Authenticated By: Marin Roberts, M.D.    Dg Chest Port 1 View  09/01/2012  *RADIOLOGY REPORT*  Clinical Data: Status post central line placement.  PORTABLE CHEST - 1 VIEW  Comparison: Chest radiograph 08/30/2012  Findings: A right IJ central venous catheter is in place.  The distal tip projects over the distal superior vena cava.  Heart and mediastinal contours are stable.  Pulmonary vascular prominence appears similar to recent prior study.  Lung volumes are low.  No focal airspace disease or pleural effusion.  Postsurgical changes of the lower cervical spine.  Negative for pneumothorax.  No subcutaneous gas in the chest wall.  IMPRESSION:  Satisfactory position of right IJ central venous catheter.  No complicating feature.   Original Report Authenticated By: Britta Mccreedy, M.D.    Dg Chest Port 1 View  08/30/2012  *RADIOLOGY REPORT*  Clinical Data: Cough and weakness.  PORTABLE CHEST - 1 VIEW  Comparison: 01/08/2012 and 01/02/2012.  Findings: Central pulmonary vascular prominence without pulmonary edema.  No segmental infiltrate or gross pneumothorax.  Calcified slightly tortuous aorta.  Heart size top normal.  Postsurgical changes lower cervical spine.  IMPRESSION: No acute abnormality.  If there is a persistent unexplained cough, follow-up two-view exam may be considered.   Original Report Authenticated By: Lacy Duverney, M.D.  Dg Knee Right Port  08/29/2012  *RADIOLOGY REPORT*  Clinical Data: Severe right knee pain.  PORTABLE RIGHT KNEE - 1-2 VIEW  Comparison: None.  Findings: There is marked narrowing of the medial  compartment with small marginal osteophytes. Slight sclerosis of the medial compartment.  Lateral and patellofemoral compartments appear essentially normal.  No appreciable joint effusion.  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   Original Report Authenticated By: Francene Boyers, M.D.    Dg Fluoro Guide Ndl Plc/bx  09/04/2012  *RADIOLOGY REPORT*  Clinical Data:  Knee pain.  Possible septic joint.  ARTHROCENTESIS/INJECTION OF LARGE JOINT  Technique: There is some benefits of the procedure were discussed with the patient.  She was given an opportunity to ask questions. Written consent was then obtained.  Time-out was performed for the procedure.  Fluoroscopy Time: 0.5 minutes.  Comparison:  MRI of the right thigh 09/03/2012.  Findings: The knee was localized under fluoroscopy.  A 20 gauge spinal needle was then advanced into the joint.  Position was confirmed by injecting contrast material.  A single drop of normal appearing joint fluid was aspirated.  7 ml of saline were then injected into the joint and subsequent ratio was attempted but failed.  The patient tolerated the procedure well.  There was no immediate post procedural complication.  Specimen obtained was sent to the laboratory.  IMPRESSION: Knee aspiration as above.  Findings were discussed with Dr. Valentina Gu immediately after procedure.   Original Report Authenticated By: Holley Dexter, M.D.     Scheduled Meds:   . aspirin EC  325 mg Oral Q breakfast  . docusate sodium  300 mg Oral QHS  . enoxaparin (LOVENOX) injection  80 mg Subcutaneous Daily  . imipenem-cilastatin  500 mg Intravenous Q6H  . insulin aspart  0-15 Units Subcutaneous TID WC  . insulin aspart  0-5 Units Subcutaneous QHS  . insulin aspart  4 Units Subcutaneous TID WC  . lamoTRIgine  200 mg Oral Daily  . levothyroxine  150 mcg Oral QAC breakfast  . LORazepam  0.5 mg Intravenous Once  . losartan  50 mg Oral Daily  . montelukast  10 mg  Oral QHS  . morphine  30 mg Oral Q12H  . Oxcarbazepine  300 mg Oral Daily  . OXcarbazepine  600 mg Oral QHS  . simvastatin  40 mg Oral QPM  . sodium chloride  10-40 mL Intracatheter Q12H  . sodium chloride  3 mL Intravenous Q12H  . vancomycin  1,500 mg Intravenous Q12H   Continuous Infusions:   Principal Problem:  *Right anterior knee pain Active Problems:  HYPOTHYROIDISM  DM type 2 with diabetic peripheral neuropathy  ANEMIA-NOS  BIPOLAR AFFECTIVE DISORDER, DEPRESSED  HYPERTENSION  ASTHMA  GERD  Obesity, morbid (more than 100 lbs over ideal weight or BMI > 40)    Time spent: 40 minutes   [facility]  [facility] Pager [phone]. If 8PM-8AM, please contact night-coverage at [email or web address], password [facility] 09/05/2012, 7:49 AM  LOS: 8 days

## 2012-09-06 ENCOUNTER — Encounter (HOSPITAL_COMMUNITY): Payer: Self-pay | Admitting: Anesthesiology

## 2012-09-06 ENCOUNTER — Inpatient Hospital Stay (HOSPITAL_COMMUNITY): Payer: BC Managed Care – PPO | Admitting: Anesthesiology

## 2012-09-06 ENCOUNTER — Encounter (HOSPITAL_COMMUNITY)
Admission: EM | Disposition: A | Discharge status: Home Health | Payer: Self-pay | Source: Home / Self Care | Attending: Internal Medicine

## 2012-09-06 HISTORY — PX: I & D EXTREMITY: SHX5045

## 2012-09-06 LAB — GLUCOSE, CAPILLARY
Glucose-Capillary: 110 mg/dL — ABNORMAL HIGH (ref 70–99)
Glucose-Capillary: 136 mg/dL — ABNORMAL HIGH (ref 70–99)
Glucose-Capillary: 103 mg/dL — ABNORMAL HIGH (ref 70–99)

## 2012-09-06 LAB — CBC
HCT: 24.8 % — ABNORMAL LOW (ref 36.0–46.0)
Hemoglobin: 8 g/dL — ABNORMAL LOW (ref 12.0–15.0)
MCH: 26.5 pg (ref 26.0–34.0)
MCHC: 32.3 g/dL (ref 30.0–36.0)
MCV: 82.1 fL (ref 78.0–100.0)
Platelets: 391 10*3/uL (ref 150–400)
RBC: 3.02 MIL/uL — ABNORMAL LOW (ref 3.87–5.11)
RDW: 14.9 % (ref 11.5–15.5)
WBC: 6.4 10*3/uL (ref 4.0–10.5)

## 2012-09-06 LAB — GRAM STAIN

## 2012-09-06 LAB — C-REACTIVE PROTEIN: CRP: 24.2 mg/dL — ABNORMAL HIGH (ref <0.60)

## 2012-09-06 SURGERY — IRRIGATION AND DEBRIDEMENT EXTREMITY
Anesthesia: General | Site: Thigh | Laterality: Right | Wound class: Clean

## 2012-09-06 MED ORDER — HYDROMORPHONE HCL PF 1 MG/ML IJ SOLN
INTRAMUSCULAR | Status: DC | PRN
  Administered 2012-09-06 (×2): 0.5 mg via INTRAVENOUS

## 2012-09-06 MED ORDER — VECURONIUM BROMIDE 10 MG IV SOLR
INTRAVENOUS | Status: DC | PRN
  Administered 2012-09-06: 2 mg via INTRAVENOUS

## 2012-09-06 MED ORDER — GLYCOPYRROLATE 0.2 MG/ML IJ SOLN
INTRAMUSCULAR | Status: DC | PRN
  Administered 2012-09-06: .6 mg via INTRAVENOUS

## 2012-09-06 MED ORDER — MIDAZOLAM HCL 5 MG/5ML IJ SOLN
INTRAMUSCULAR | Status: DC | PRN
  Administered 2012-09-06 (×2): 2 mg via INTRAVENOUS

## 2012-09-06 MED ORDER — HYDROMORPHONE HCL PF 1 MG/ML IJ SOLN
0.2500 mg | INTRAMUSCULAR | Status: DC | PRN
  Administered 2012-09-06 (×4): 0.5 mg via INTRAVENOUS

## 2012-09-06 MED ORDER — OXYCODONE HCL 5 MG/5ML PO SOLN: 5.0000 mg | Freq: Once | ORAL | Status: AC | PRN

## 2012-09-06 MED ORDER — SODIUM CHLORIDE 0.9 % IR SOLN
Status: DC | PRN
  Administered 2012-09-06: 1000 mL
  Administered 2012-09-06: 3000 mL

## 2012-09-06 MED ORDER — MIDAZOLAM HCL 2 MG/2ML IJ SOLN
INTRAMUSCULAR | Status: AC
  Filled 2012-09-06: qty 2

## 2012-09-06 MED ORDER — FENTANYL CITRATE 0.05 MG/ML IJ SOLN
INTRAMUSCULAR | Status: DC | PRN
  Administered 2012-09-06: 50 ug via INTRAVENOUS
  Administered 2012-09-06: 100 ug via INTRAVENOUS
  Administered 2012-09-06 (×4): 50 ug via INTRAVENOUS

## 2012-09-06 MED ORDER — FENTANYL CITRATE 0.05 MG/ML IJ SOLN: 50.0000 ug | Freq: Once | INTRAMUSCULAR | Status: DC

## 2012-09-06 MED ORDER — SUCCINYLCHOLINE CHLORIDE 20 MG/ML IJ SOLN
INTRAMUSCULAR | Status: DC | PRN
  Administered 2012-09-06: 160 mg via INTRAVENOUS

## 2012-09-06 MED ORDER — NEOSTIGMINE METHYLSULFATE 1 MG/ML IJ SOLN
INTRAMUSCULAR | Status: DC | PRN
  Administered 2012-09-06: 5 mg via INTRAVENOUS

## 2012-09-06 MED ORDER — LACTATED RINGERS IV SOLN
INTRAVENOUS | Status: DC
  Administered 2012-09-08 – 2012-09-09 (×2): via INTRAVENOUS

## 2012-09-06 MED ORDER — HYDROMORPHONE HCL PF 1 MG/ML IJ SOLN
INTRAMUSCULAR | Status: AC
  Filled 2012-09-06: qty 1

## 2012-09-06 MED ORDER — MIDAZOLAM HCL 2 MG/2ML IJ SOLN
1.0000 mg | INTRAMUSCULAR | Status: DC | PRN
  Administered 2012-09-06: 2 mg via INTRAVENOUS

## 2012-09-06 MED ORDER — ARTIFICIAL TEARS OP OINT
TOPICAL_OINTMENT | OPHTHALMIC | Status: DC | PRN
  Administered 2012-09-06: 1 via OPHTHALMIC

## 2012-09-06 MED ORDER — FLUCONAZOLE 100MG IVPB
100.0000 mg | INTRAVENOUS | Status: DC
  Administered 2012-09-06: 100 mg via INTRAVENOUS
  Filled 2012-09-06 (×2): qty 50

## 2012-09-06 MED ORDER — ONDANSETRON HCL 4 MG/2ML IJ SOLN
INTRAMUSCULAR | Status: DC | PRN
  Administered 2012-09-06: 4 mg via INTRAVENOUS

## 2012-09-06 MED ORDER — LABETALOL HCL 5 MG/ML IV SOLN
INTRAVENOUS | Status: DC | PRN
  Administered 2012-09-06 (×4): 5 mg via INTRAVENOUS

## 2012-09-06 MED ORDER — PROMETHAZINE HCL 25 MG/ML IJ SOLN: 6.2500 mg | INTRAMUSCULAR | Status: DC | PRN

## 2012-09-06 MED ORDER — PROPOFOL 10 MG/ML IV BOLUS
INTRAVENOUS | Status: DC | PRN
  Administered 2012-09-06: 200 mg via INTRAVENOUS

## 2012-09-06 MED ORDER — LACTATED RINGERS IV SOLN
INTRAVENOUS | Status: DC | PRN
  Administered 2012-09-06: 13:00:00 via INTRAVENOUS

## 2012-09-06 MED ORDER — OXYCODONE HCL 5 MG PO TABS
ORAL_TABLET | ORAL | Status: AC
  Filled 2012-09-06: qty 1

## 2012-09-06 MED ORDER — OXYCODONE HCL 5 MG PO TABS
5.0000 mg | ORAL_TABLET | Freq: Once | ORAL | Status: AC | PRN
  Administered 2012-09-06: 5 mg via ORAL

## 2012-09-06 MED ORDER — MIDAZOLAM HCL 2 MG/2ML IJ SOLN: 2.0000 mg | Freq: Once | INTRAMUSCULAR | Status: DC

## 2012-09-06 MED ORDER — LIDOCAINE HCL (CARDIAC) 20 MG/ML IV SOLN
INTRAVENOUS | Status: DC | PRN
  Administered 2012-09-06: 50 mg via INTRAVENOUS

## 2012-09-06 SURGICAL SUPPLY — 55 items
BANDAGE GAUZE ELAST BULKY 4 IN (GAUZE/BANDAGES/DRESSINGS) ×2 IMPLANT
BLADE SURG 10 STRL SS (BLADE) ×2 IMPLANT
BNDG CMPR MED 15X6 ELC VLCR LF (GAUZE/BANDAGES/DRESSINGS) ×1
BNDG COHESIVE 4X5 TAN STRL (GAUZE/BANDAGES/DRESSINGS) ×2 IMPLANT
BNDG ELASTIC 6X15 VLCR STRL LF (GAUZE/BANDAGES/DRESSINGS) ×1 IMPLANT
BNDG GAUZE STRTCH 6 (GAUZE/BANDAGES/DRESSINGS) ×3 IMPLANT
BRUSH SCRUB DISP (MISCELLANEOUS) ×4 IMPLANT
CLOTH BEACON ORANGE TIMEOUT ST (SAFETY) ×2 IMPLANT
CONT SPEC 4OZ CLIKSEAL STRL BL (MISCELLANEOUS) ×1 IMPLANT
COVER SURGICAL LIGHT HANDLE (MISCELLANEOUS) ×3 IMPLANT
DRAPE C-ARMOR (DRAPES) ×1 IMPLANT
DRAPE U-SHAPE 47X51 STRL (DRAPES) ×2 IMPLANT
DRSG ADAPTIC 3X8 NADH LF (GAUZE/BANDAGES/DRESSINGS) ×1 IMPLANT
ELECT BLADE 4.0 EZ CLEAN MEGAD (MISCELLANEOUS) ×2
ELECT CAUTERY BLADE 6.4 (BLADE) ×1 IMPLANT
ELECT REM PT RETURN 9FT ADLT (ELECTROSURGICAL) ×2
ELECTRODE BLDE 4.0 EZ CLN MEGD (MISCELLANEOUS) IMPLANT
ELECTRODE REM PT RTRN 9FT ADLT (ELECTROSURGICAL) IMPLANT
FLUID NSS /IRRIG 3000 ML XXX (IV SOLUTION) ×1 IMPLANT
GLOVE BIO SURGEON STRL SZ7.5 (GLOVE) ×3 IMPLANT
GLOVE BIO SURGEON STRL SZ8 (GLOVE) ×3 IMPLANT
GLOVE BIOGEL PI IND STRL 7.5 (GLOVE) ×1 IMPLANT
GLOVE BIOGEL PI IND STRL 8 (GLOVE) ×1 IMPLANT
GLOVE BIOGEL PI INDICATOR 7.5 (GLOVE) ×1
GLOVE BIOGEL PI INDICATOR 8 (GLOVE) ×1
GLOVE BIOGEL PI ORTHO PRO SZ7 (GLOVE) ×2
GLOVE PI ORTHO PRO STRL SZ7 (GLOVE) IMPLANT
GLOVE SURG SS PI 7.0 STRL IVOR (GLOVE) ×3 IMPLANT
GOWN PREVENTION PLUS XLARGE (GOWN DISPOSABLE) ×2 IMPLANT
GOWN STRL NON-REIN LRG LVL3 (GOWN DISPOSABLE) ×3 IMPLANT
GOWN STRL REIN XL XLG (GOWN DISPOSABLE) ×2 IMPLANT
HANDPIECE INTERPULSE COAX TIP (DISPOSABLE)
KIT BASIN OR (CUSTOM PROCEDURE TRAY) ×2 IMPLANT
KIT ROOM TURNOVER OR (KITS) ×2 IMPLANT
MANIFOLD NEPTUNE II (INSTRUMENTS) ×2 IMPLANT
NS IRRIG 1000ML POUR BTL (IV SOLUTION) ×2 IMPLANT
PACK ORTHO EXTREMITY (CUSTOM PROCEDURE TRAY) ×2 IMPLANT
PAD ARMBOARD 7.5X6 YLW CONV (MISCELLANEOUS) ×4 IMPLANT
PADDING CAST COTTON 6X4 STRL (CAST SUPPLIES) ×1 IMPLANT
SET CYSTO W/LG BORE CLAMP LF (SET/KITS/TRAYS/PACK) ×1 IMPLANT
SET HNDPC FAN SPRY TIP SCT (DISPOSABLE) IMPLANT
SPONGE GAUZE 4X4 12PLY (GAUZE/BANDAGES/DRESSINGS) ×1 IMPLANT
SPONGE LAP 18X18 X RAY DECT (DISPOSABLE) ×2 IMPLANT
STOCKINETTE IMPERVIOUS 9X36 MD (GAUZE/BANDAGES/DRESSINGS) ×2 IMPLANT
SUT ETHILON 3 0 PS 1 (SUTURE) ×1 IMPLANT
SUT PDS AB 0 CT 36 (SUTURE) ×1 IMPLANT
SUT PDS AB 2-0 CT1 27 (SUTURE) ×1 IMPLANT
SWAB COLLECTION DEVICE MRSA (MISCELLANEOUS) ×1 IMPLANT
TOWEL OR 17X24 6PK STRL BLUE (TOWEL DISPOSABLE) ×1 IMPLANT
TOWEL OR 17X26 10 PK STRL BLUE (TOWEL DISPOSABLE) ×4 IMPLANT
TUBE ANAEROBIC SPECIMEN COL (MISCELLANEOUS) ×1 IMPLANT
TUBE CONNECTING 12X1/4 (SUCTIONS) ×2 IMPLANT
UNDERPAD 30X30 INCONTINENT (UNDERPADS AND DIAPERS) ×2 IMPLANT
WATER STERILE IRR 1000ML POUR (IV SOLUTION) ×1 IMPLANT
YANKAUER SUCT BULB TIP NO VENT (SUCTIONS) ×2 IMPLANT

## 2012-09-06 NOTE — Brief Op Note (Signed)
08/28/2012 - 09/06/2012  2:47 PM  PATIENT:  Amy Barnes  61 y.o. female  PRE-OPERATIVE DIAGNOSIS:  Right leg infection, abscess  POST-OPERATIVE DIAGNOSIS:  Right leg infection, abscess  PROCEDURE:  Procedure(s) (LRB) with comments: IRRIGATION AND DEBRIDEMENT EXTREMITY (Right) - I&D of right thigh  SURGEON:  Surgeon(s) and Role:    * Budd Palmer, MD - Primary  PHYSICIAN ASSISTANT: Montez Morita, [facility]  ANESTHESIA:   general  EBL:  Total I/O In: 1000 [I.V.:1000] Out: -   BLOOD ADMINISTERED:none  DRAINS: (Medium large) Hemovact drain(s) in the right thigh deep abscess with  Suction Open   LOCAL MEDICATIONS USED:  NONE  SPECIMEN:  Source of Specimen:  right thigh deep abscess  DISPOSITION OF SPECIMEN:  micro  COUNTS:  YES  TOURNIQUET:  * No tourniquets in log *  DICTATION: .Other Dictation: Dictation Number [id number]  PLAN OF CARE: Admit to inpatient   PATIENT DISPOSITION:  PACU - hemodynamically stable.   Delay start of Pharmacological VTE agent (>24hrs) due to surgical blood loss or risk of bleeding: no

## 2012-09-06 NOTE — Preoperative (Signed)
Beta Blockers   Reason not to administer Beta Blockers:Not Applicable 

## 2012-09-06 NOTE — Anesthesia Preprocedure Evaluation (Signed)
Anesthesia Evaluation  Patient identified by MRN, date of birth, ID band Patient awake    Reviewed: Allergy & Precautions, H&P , NPO status , Patient's Chart, lab work & pertinent test results  Airway Mallampati: II TM Distance: >3 FB Neck ROM: Full    Dental  (+) Teeth Intact and Dental Advisory Given   Pulmonary shortness of breath, asthma , sleep apnea and Continuous Positive Airway Pressure Ventilation ,  breath sounds clear to auscultation        Cardiovascular hypertension, Pt. on medications Rhythm:Regular Rate:Normal     Neuro/Psych Depression  Neuromuscular disease    GI/Hepatic Neg liver ROS, GERD-  ,  Endo/Other  diabetes, Well Controlled, Oral Hypoglycemic AgentsHypothyroidism Morbid obesity  Renal/GU negative Renal ROS     Musculoskeletal   Abdominal (+) + obese,   Peds  Hematology negative hematology ROS (+)   Anesthesia Other Findings   Reproductive/Obstetrics                           Anesthesia Physical Anesthesia Plan  ASA: III  Anesthesia Plan: General   Post-op Pain Management:    Induction: Intravenous  Airway Management Planned: Oral ETT  Additional Equipment:   Intra-op Plan:   Post-operative Plan: Extubation in OR  Informed Consent: I have reviewed the patients History and Physical, chart, labs and discussed the procedure including the risks, benefits and alternatives for the proposed anesthesia with the patient or authorized representative who has indicated his/her understanding and acceptance.     Plan Discussed with: CRNA and Surgeon  Anesthesia Plan Comments:         Anesthesia Quick Evaluation

## 2012-09-06 NOTE — Progress Notes (Signed)
Utilization review completed. Camile Esters, RN, BSN. 

## 2012-09-06 NOTE — Progress Notes (Signed)
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

## 2012-09-06 NOTE — Progress Notes (Signed)
[facility] PROGRESS NOTE  [name] ZOX:[id number] DOB: Dec 31, 1950 DOA: 08/28/2012 PCP: [name], MD  Assessment/Plan: Principal Problem:  *Right anterior knee pain Active Problems:  HYPOTHYROIDISM  DM type 2 with diabetic peripheral neuropathy  ANEMIA-NOS  BIPOLAR AFFECTIVE DISORDER, DEPRESSED  HYPERTENSION  ASTHMA  GERD  Obesity, morbid (more than 100 lbs over ideal weight or BMI > 40)    HPI :*  61 year old Amy Barnes presented with knee pain . She has had severe right knee pain.Onset of symptoms was gradual starting a few weeks ago with rapidly worsening course since that time. The patient noted no past surgery on the left knee. Prior procedures on the knee include none. Patient has been treated conservatively with over-the-counter NSAIDs and activity modification. Patient currently rates pain in the knee at 10 out of 10 with activity. There is pain at night. Patient admitted for pain control. She was found to have acute right knee pain due to medial and lateral meniscal tear, she is unable to stand up or bear weight on it, orthopedics arthroscopic surgical intervention on 09/01/2012.  She had seen Dr. Lovell Sheehan in the office last week. Dr. Lovell Sheehan thought the pain was more likely coming from her knee but he ordered a lumbar MRI just to make sure that there was a significant lumbar spine pathology contributing to her pain. She had not gotten the MRI scan yet and was admitted for pain control. The patient's lumbar spine MRI showed results as above. Details hospital course as follows    HOSPITAL COURSE:  1. Right knee pain, status post, Right knee arthroscopy with partial medial and  lateral meniscectomy.  unable to bear weight on the right leg, in a patient with morbid obesity and MRI evidence of both lateral and medial meniscal tear along with joint effusion - orthopedics and neurosurgery were consulted, continue pain control, further recommendations per orthopedics, for  now patient is unable to stand up or bear weight at all on the right leg, She has been seen by Dr. Valentina Gu on 08-2012 who performed surgery on 09/01/2012 of note patient is on Lovenox for DVT prophylaxis however it is weight-based, she will continue with this for another 2 weeks. Also seen by Dr. Lovell Sheehan neurosurgeon as she has history of L-spine problem, L-spine MRI and Dr. Lovell Sheehan evaluation not consistent with any ongoing L-spine issues. Physical therapy recommended home health physical therapy as well as a rolling walker and bedside commode. Patient to followup with Dr. Sherlean Foot 10 days .  Per Dr. Valentina Gu recommendations the patient had an MRI yesterday discussed results with radiology the patient has a posterior, leg abscess associated with myositis. I have already called Dr. Sherlean Foot multiple times and discussed the findings of the MRI. Dr. Sherlean Foot , she status post ultrasound-guided aspiration 35 cc of purulent material was removed, she has been started on empiric antibiotics. I left message for Dr. Sherlean Foot.Also consulted Dr Magnus Ivan . Next step would be irrigation and debridement of the abcess in the operating room today.    2. Diabetes mellitus type 2 morbid obesity - during this hospitalization the patient was treated with pre-meal NovoLog along with sliding scale insulin, she will resume her home medications CBG (last 3)  3. History of hypertension. Continue ARB, monitor blood pressure.  4. Hypothyroidism. Continue home dose Synthroid.  5. Dyslipidemia continue home dose statin.  6. Morbid obesity. Outpatient follow with PCP and dietician.  7.anemia hemoglobin stable at around 8.0, holding Lovenox for DVT prophylaxis , hemoglobin has  been trending down in the outpatient setting. Patient would benefit from a full GI workup to be arranged for by the primary care provider   8. Pain control patient on multiple narcotic medications, patient has been advised not to drive for at least a month  9. hyponatremia  resolved, patient has been placed on fluid restriction and a dose of Lasix has been decreased she would need a repeat BMP in one week. Sodium improved to 135, from 128.        HPI/Subjective:  Complaining of a lot of pain   Objective: Filed Vitals:   09/04/12 2006 09/05/12 0556 09/05/12 1220 09/05/12 2017  BP: 127/50 100/40 115/64 92/68  Pulse: 90 81 81 84  Temp: 97.7 F (36.5 C) 98 F (36.7 C) 98.7 F (37.1 C) 98.2 F (36.8 C)  TempSrc: Oral Oral  Oral  Resp: 20 18 18 18   Height:      Weight:      SpO2: 98% 95% 95% 98%    Intake/Output Summary (Last 24 hours) at 09/06/12 0819 Last data filed at 09/05/12 0849  Gross per 24 hour  Intake     20 ml  Output      0 ml  Net     20 ml    Exam:  HENT:  Head: Atraumatic.  Nose: Nose normal.  Mouth/Throat: Oropharynx is clear and moist.  Eyes: Conjunctivae are normal. Pupils are equal, round, and reactive to light. No scleral icterus.  Neck: Neck supple. No tracheal deviation present.  Cardiovascular: Normal rate, regular rhythm, normal heart sounds and intact distal pulses.  Pulmonary/Chest: Effort normal and breath sounds normal. No respiratory distress.  Abdominal: Soft. Normal appearance and bowel sounds are normal. She exhibits no distension. There is no tenderness.  Musculoskeletal: She exhibits no edema and no tenderness.  Neurological: She is alert. No cranial nerve deficit.    Data Reviewed: Basic Metabolic Panel:  Lab 09/05/12 9811 09/03/12 1015 09/02/12 0910 09/01/12 0610  NA 137 135 128* 131*  K 3.8 4.2 4.3 4.5  CL 101 95* 92* 94*  CO2 29 29 29 30   GLUCOSE 149* 115* 138* 107*  BUN 8 7 10 13   CREATININE 0.52 0.57 0.70 0.65  CALCIUM 8.9 9.6 8.5 9.5  MG -- -- -- --  PHOS -- -- -- --    Liver Function Tests:  Lab 09/05/12 0525  AST 17  ALT 16  ALKPHOS 110  BILITOT 0.2*  PROT 6.3  ALBUMIN 2.2*   No results found for this basename: LIPASE:5,AMYLASE:5 in the last 168 hours No results found for  this basename: AMMONIA:5 in the last 168 hours  CBC:  Lab 09/06/12 0605 09/05/12 0525 09/02/12 0645 09/01/12 0610  WBC 6.4 6.8 7.7 7.6  NEUTROABS -- -- -- --  HGB 8.0* 8.1* 9.0* 8.7*  HCT 24.8* 25.0* 27.4* 27.0*  MCV 82.1 82.8 82.8 82.6  PLT 391 401* 395 377    Cardiac Enzymes:  Lab 09/04/12 1000  CKTOTAL 18  CKMB --  CKMBINDEX --  TROPONINI --   BNP (last 3 results) No results found for this basename: PROBNP:3 in the last 8760 hours   CBG:  Lab 09/05/12 2257 09/05/12 1607 09/05/12 1123 09/05/12 0719 09/04/12 2143  GLUCAP 122* 100* 107* 112* 114*    Recent Results (from the past 240 hour(s))  SURGICAL PCR SCREEN     Status: Abnormal   Collection Time   08/29/12  5:43 AM      Component  Value Range Status Comment   MRSA, PCR POSITIVE (*) NEGATIVE Final    Staphylococcus aureus POSITIVE (*) NEGATIVE Final   URINE CULTURE     Status: Normal   Collection Time   08/30/12  3:11 PM      Component Value Range Status Comment   Specimen Description URINE, CATHETERIZED   Final    Special Requests NONE   Final    Culture  Setup Time 08/30/2012 15:34   Final    Colony Count >=100,000 COLONIES/ML   Final    Culture KLEBSIELLA PNEUMONIAE   Final    Report Status 09/01/2012 FINAL   Final    Organism ID, Bacteria KLEBSIELLA PNEUMONIAE   Final   BODY FLUID CULTURE     Status: Normal (Preliminary result)   Collection Time   09/04/12  2:30 PM      Component Value Range Status Comment   Specimen Description SYNOVIAL RIGHT KNEE   Final    Special Requests NONE   Final    Gram Stain     Final    Value: WBC PRESENT,BOTH PMN AND MONONUCLEAR     NO ORGANISMS SEEN   Culture Culture reincubated for better growth   Final    Report Status PENDING   Incomplete   CULTURE, BLOOD (ROUTINE X 2)     Status: Normal   Collection Time   09/05/12  4:51 PM      Component Value Range Status Comment   Specimen Description BLOOD LEFT ARM   Final    Special Requests BOTTLES DRAWN AEROBIC AND ANAEROBIC  10CC   Final    Culture  Setup Time 09/05/2012 20:57   Final    Culture     Final    Value:        BLOOD CULTURE RECEIVED NO GROWTH TO DATE CULTURE WILL BE HELD FOR 5 DAYS BEFORE ISSUING A FINAL NEGATIVE REPORT   Report Status 09/06/2012 FINAL   Final   CULTURE, BLOOD (ROUTINE X 2)     Status: Normal   Collection Time   09/05/12  4:56 PM      Component Value Range Status Comment   Specimen Description BLOOD LEFT HAND   Final    Special Requests     Final    Value: BOTTLES DRAWN AEROBIC AND ANAEROBIC 10CC AER,8CC ANA   Culture  Setup Time 09/05/2012 20:57   Final    Culture     Final    Value:        BLOOD CULTURE RECEIVED NO GROWTH TO DATE CULTURE WILL BE HELD FOR 5 DAYS BEFORE ISSUING A FINAL NEGATIVE REPORT   Report Status 09/06/2012 FINAL   Final      Studies: Mr Lumbar Spine W Wo Contrast  08/31/2012  *RADIOLOGY REPORT*  Clinical Data: Severe right leg and knee pain.  Lumbar spine surgery 01/08/2012.  MRI LUMBAR SPINE WITHOUT AND WITH CONTRAST  Technique:  Multiplanar and multiecho pulse sequences of the lumbar spine were obtained without and with intravenous contrast.  Contrast: 20mL MULTIHANCE GADOBENATE DIMEGLUMINE 529 MG/ML IV SOLN  Comparison:  None available.  Findings:  Normal signal is present in the conus medullaris which terminates at L1-2.  Patient is status post L3-4 PLIF.  The marrow signal, vertebral body heights, and alignment are otherwise normal. Limited imaging of the abdomen is unremarkable.  L1-2:  Negative.  L2-3:  Mild facet hypertrophy is present.  There is slight disc bulging.  No significant stenosis is  present.  L3-4:  The patient is status post fusion.  Bilateral foraminotomies were performed.  The central canal and foramina are widely patent. The disc spacers are well positioned.  L4-5:  A mild broad-based disc herniation is present.  Facet hypertrophy is noted bilaterally.  This leads to mild distortion of the central canal without significant stenosis.  L5-S1:  A  rightward disc herniation potentially contacts the exiting L5 nerve root within the foramen.  IMPRESSION:  1.  Status post L3-4 PLIF without evidence for residual or recurrent stenosis. 2.  Rightward disc herniation at L5-S1 potentially contacts the exiting right L5 nerve root. 3.  Slight disc bulging at to L2-3 and L4-5 without significant stenosis at either level.   Original Report Authenticated By: Marin Roberts, M.D.    Dg Pelvis Portable  09/01/2012  *RADIOLOGY REPORT*  Clinical Data: Right hip pain.  PORTABLE PELVIS  Comparison: None  Findings: There are mild right hip joint degenerative changes but no acute bony findings or plain film evidence of avascular necrosis.  Pubic symphysis and SI joints are intact.  A left hip prosthesis is noted along with lumbar fusion hardware.  IMPRESSION: Degenerative changes but no acute bony findings.   Original Report Authenticated By: Rudie Meyer, M.D.    Mr Femur Right W Wo Contrast  09/04/2012  *RADIOLOGY REPORT*  Clinical Data: Severe right leg pain.  Recent patellar surgery.  MRI RIGHT THIGH WITHOUT CONTRAST  Technique:  Multiplanar, multisequence MR imaging of the right thigh was performed.  No intravenous contrast was administered.  Comparison:   None  Findings:  There is a large complex the joint effusion with thick irregular synovial enhancement worrisome for septic arthritis.  No definite findings for osteomyelitis.  Extending out from a suprapatellar bursa and along the anterior and lateral aspect of the femur is an elongated abscess.  This extends approximately halfway up the femur measuring a total of approximately 20 cm in length.  There is severe surrounding myositis involving the thigh musculature extending all way up to the hip.  No MR findings to suggest osteomyelitis.  There is surrounding cellulitis.  IMPRESSION:  1.  MR findings most consistent with septic arthritis with dissecting abscess up along the anterior and lateral aspect of the  femur with severe surrounding myositis extending all way up to the hip. 2.  No findings to suggest osteomyelitis.   Original Report Authenticated By: Rudie Meyer, M.D.    US Aspiration  09/04/2012  *RADIOLOGY REPORT*  Clinical Data: Deep tissue fluid collection about the right femur. Pain.  Fevers.  ULTRASOUND GUIDED ASPIRATION.  Comparison:  Right femur MRI 09/03/2012.  Sedation:  None  Procedure:  The procedure, risks, benefits, and alternatives were explained to the patient.  Questions regarding the procedure were encouraged and answered.  The patient understands and consents to the procedure.  The anterior right thigh was prepped with betadine in a sterile fashion, and a sterile drape was applied covering the operative field.  A sterile gown and sterile gloves were used for the procedure. Local anesthesia was provided with 1% Lidocaine.  The fluid collection was identified anterior and lateral to the right femur.  Following local anesthesia, a 20 gauge needle was advanced into the fluid collection.  A 30 ml syringe was filled with somewhat purulent and sanguinous material.  Using a 10 ml syringe I was able to obtain an additional 5 mm of fluid from a slightly more lateral aspect of the collection.  The needle was  then removed.  Hemostasis was achieved.  Complications: None.  IMPRESSION:  1.  Technically successful ultrasound guided aspiration of a complex fluid collection adjacent to the right femur. Approximately 35 ml of purulent sanguinous fluid was aspirated. 2.  The fluid was sent for cell count, Gram stain and culture.   Original Report Authenticated By: Marin Roberts, M.D.    Dg Chest Port 1 View  09/01/2012  *RADIOLOGY REPORT*  Clinical Data: Status post central line placement.  PORTABLE CHEST - 1 VIEW  Comparison: Chest radiograph 08/30/2012  Findings: A right IJ central venous catheter is in place.  The distal tip projects over the distal superior vena cava.  Heart and mediastinal contours  are stable.  Pulmonary vascular prominence appears similar to recent prior study.  Lung volumes are low.  No focal airspace disease or pleural effusion.  Postsurgical changes of the lower cervical spine.  Negative for pneumothorax.  No subcutaneous gas in the chest wall.  IMPRESSION:  Satisfactory position of right IJ central venous catheter.  No complicating feature.   Original Report Authenticated By: Britta Mccreedy, M.D.    Dg Chest Port 1 View  08/30/2012  *RADIOLOGY REPORT*  Clinical Data: Cough and weakness.  PORTABLE CHEST - 1 VIEW  Comparison: 01/08/2012 and 01/02/2012.  Findings: Central pulmonary vascular prominence without pulmonary edema.  No segmental infiltrate or gross pneumothorax.  Calcified slightly tortuous aorta.  Heart size top normal.  Postsurgical changes lower cervical spine.  IMPRESSION: No acute abnormality.  If there is a persistent unexplained cough, follow-up two-view exam may be considered.   Original Report Authenticated By: Lacy Duverney, M.D.    Dg Knee Right Port  08/29/2012  *RADIOLOGY REPORT*  Clinical Data: Severe right knee pain.  PORTABLE RIGHT KNEE - 1-2 VIEW  Comparison: None.  Findings: There is marked narrowing of the medial compartment with small marginal osteophytes. Slight sclerosis of the medial compartment.  Lateral and patellofemoral compartments appear essentially normal.  No appreciable joint effusion.  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   Original Report Authenticated By: Francene Boyers, M.D.    Dg Fluoro Guide Ndl Plc/bx  09/04/2012  *RADIOLOGY REPORT*  Clinical Data:  Knee pain.  Possible septic joint.  ARTHROCENTESIS/INJECTION OF LARGE JOINT  Technique: There is some benefits of the procedure were discussed with the patient.  She was given an opportunity to ask questions. Written consent was then obtained.  Time-out was performed for the procedure.  Fluoroscopy Time: 0.5 minutes.  Comparison:  MRI of the right  thigh 09/03/2012.  Findings: The knee was localized under fluoroscopy.  A 20 gauge spinal needle was then advanced into the joint.  Position was confirmed by injecting contrast material.  A single drop of normal appearing joint fluid was aspirated.  7 ml of saline were then injected into the joint and subsequent ratio was attempted but failed.  The patient tolerated the procedure well.  There was no immediate post procedural complication.  Specimen obtained was sent to the laboratory.  IMPRESSION: Knee aspiration as above.  Findings were discussed with Dr. Valentina Gu immediately after procedure.   Original Report Authenticated By: Holley Dexter, M.D.     Scheduled Meds:   . aspirin EC  325 mg Oral Q breakfast  . Chlorhexidine Gluconate Cloth  6 each Topical Q0600  . docusate sodium  300 mg Oral QHS  . imipenem-cilastatin  500 mg Intravenous Q6H  . insulin aspart  0-15 Units Subcutaneous TID WC  .  insulin aspart  0-5 Units Subcutaneous QHS  . insulin aspart  4 Units Subcutaneous TID WC  . lamoTRIgine  200 mg Oral Daily  . levothyroxine  150 mcg Oral QAC breakfast  . LORazepam  0.5 mg Intravenous Once  . montelukast  10 mg Oral QHS  . morphine  30 mg Oral Q12H  . mupirocin ointment  1 application Nasal BID  . Oxcarbazepine  300 mg Oral Daily  . OXcarbazepine  600 mg Oral QHS  . simvastatin  40 mg Oral QPM  . sodium chloride  10-40 mL Intracatheter Q12H  . sodium chloride  3 mL Intravenous Q12H  . vancomycin  1,500 mg Intravenous Q8H  . [DISCONTINUED] enoxaparin (LOVENOX) injection  80 mg Subcutaneous Daily  . [DISCONTINUED] losartan  50 mg Oral Daily  . [DISCONTINUED] vancomycin  1,500 mg Intravenous Q12H   Continuous Infusions:   Principal Problem:  *Right anterior knee pain Active Problems:  HYPOTHYROIDISM  DM type 2 with diabetic peripheral neuropathy  ANEMIA-NOS  BIPOLAR AFFECTIVE DISORDER, DEPRESSED  HYPERTENSION  ASTHMA  GERD  Obesity, morbid (more than 100 lbs over ideal  weight or BMI > 40)    Time spent: 40 minutes   [facility]  [facility] Pager [phone]. If 8PM-8AM, please contact night-coverage at [email or web address], password [email or web address] 09/06/2012, 8:19 AM  LOS: 9 days

## 2012-09-06 NOTE — Progress Notes (Signed)
PT Cancellation Note  Patient Details Name: Amy Barnes MRN: [id number] DOB: December 29, 1950   Cancelled Treatment:    Reason Eval/Treat Not Completed: Patient at procedure or test/unavailable (Pt down for I & D this am.) Will follow-up with pt 09/07/12. Thanks.   Aarohi Redditt M 09/06/2012, 11:18 AM  09/06/2012 Cephus Shelling, PT, DPT [phone]

## 2012-09-06 NOTE — Anesthesia Procedure Notes (Signed)
Procedure Name: Intubation Date/Time: 09/06/2012 1:29 PM Performed by: Luster Landsberg Pre-anesthesia Checklist: Patient identified, Emergency Drugs available, Suction available and Patient being monitored Patient Re-evaluated:Patient Re-evaluated prior to inductionOxygen Delivery Method: Circle system utilized Preoxygenation: Pre-oxygenation with 100% oxygen Intubation Type: IV induction Ventilation: Mask ventilation without difficulty Laryngoscope Size: Mac and 3 Grade View: Grade I Tube type: Oral Tube size: 7.0 mm Number of attempts: 1 Airway Equipment and Method: Stylet Placement Confirmation: ETT inserted through vocal cords under direct vision,  positive ETCO2 and CO2 detector Secured at: 23 cm Tube secured with: Tape Dental Injury: Teeth and Oropharynx as per pre-operative assessment

## 2012-09-06 NOTE — Progress Notes (Signed)
Occupational Therapy Treatment Patient Details Name: Amy Barnes MRN: [id number] DOB: Feb 12, 1951 Today's Date: 09/06/2012 Time: 1478-2956 OT Time Calculation (min): 10 min  OT Assessment / Plan / Recommendation Comments on Treatment Session pt going for I and Barnes today .                               ADL  Grooming: Teeth care;Set up Where Assessed - Grooming: Supine, head of bed up       OT Goals Miscellaneous OT Goals OT Goal: Miscellaneous Goal #1 - Progress: Not progressing  Visit Information  Last OT Received On: 09/06/12          Cognition  Overall Cognitive Status: Appears within functional limits for tasks assessed/performed Arousal/Alertness: Awake/alert Orientation Level: Appears intact for tasks assessed Behavior During Session: [facility] for tasks performed    Mobility  Shoulder Instructions Bed Mobility Details for Bed Mobility Assistance: Attempted to roll to the left, but RN then came in and said pt leaving soon for surgery. Pt then refused further mobility             End of Session OT - End of Session Activity Tolerance: Patient limited by pain Patient left: in bed       Amy Barnes, Amy Barnes 09/06/2012, 10:08 AM

## 2012-09-06 NOTE — Transfer of Care (Signed)
Immediate Anesthesia Transfer of Care Note  Patient: Amy Barnes  Procedure(s) Performed: Procedure(s) (LRB) with comments: IRRIGATION AND DEBRIDEMENT EXTREMITY (Right) - I&D of right thigh  Patient Location: PACU  Anesthesia Type:General  Level of Consciousness: awake, alert  and oriented  Airway & Oxygen Therapy: Patient Spontanous Breathing and Patient connected to face mask oxygen  Post-op Assessment: Report given to PACU RN, Post -op Vital signs reviewed and stable and Patient moving all extremities  Post vital signs: Reviewed and stable  Complications: No apparent anesthesia complications

## 2012-09-07 ENCOUNTER — Encounter (HOSPITAL_COMMUNITY): Payer: Self-pay | Admitting: Orthopedic Surgery

## 2012-09-07 LAB — GLUCOSE, CAPILLARY
Glucose-Capillary: 122 mg/dL — ABNORMAL HIGH (ref 70–99)
Glucose-Capillary: 114 mg/dL — ABNORMAL HIGH (ref 70–99)
Glucose-Capillary: 94 mg/dL (ref 70–99)
Glucose-Capillary: 113 mg/dL — ABNORMAL HIGH (ref 70–99)

## 2012-09-07 LAB — BASIC METABOLIC PANEL
BUN: 6 mg/dL (ref 6–23)
CO2: 32 mEq/L (ref 19–32)
Calcium: 9.1 mg/dL (ref 8.4–10.5)
Chloride: 99 mEq/L (ref 96–112)
Creatinine, Ser: 0.5 mg/dL (ref 0.50–1.10)
GFR calc Af Amer: >90 mL/min (ref >90)
GFR calc non Af Amer: >90 mL/min (ref >90)
Glucose, Bld: 135 mg/dL — ABNORMAL HIGH (ref 70–99)
Potassium: 3.8 mEq/L (ref 3.5–5.1)
Sodium: 137 mEq/L (ref 135–145)

## 2012-09-07 LAB — CBC
HCT: 23 % — ABNORMAL LOW (ref 36.0–46.0)
Hemoglobin: 7.4 g/dL — ABNORMAL LOW (ref 12.0–15.0)
MCH: 26.7 pg (ref 26.0–34.0)
MCHC: 32.2 g/dL (ref 30.0–36.0)
MCV: 83 fL (ref 78.0–100.0)
Platelets: 350 10*3/uL (ref 150–400)
RBC: 2.77 MIL/uL — ABNORMAL LOW (ref 3.87–5.11)
RDW: 14.8 % (ref 11.5–15.5)
WBC: 6.6 10*3/uL (ref 4.0–10.5)

## 2012-09-07 LAB — BODY FLUID CULTURE

## 2012-09-07 LAB — PREPARE RBC (CROSSMATCH)

## 2012-09-07 LAB — VANCOMYCIN, TROUGH: Vancomycin Tr: 19.9 ug/mL (ref 10.0–20.0)

## 2012-09-07 MED ORDER — ENOXAPARIN SODIUM 30 MG/0.3ML ~~LOC~~ SOLN
30.0000 mg | SUBCUTANEOUS | Status: DC
  Administered 2012-09-07 – 2012-09-09 (×3): 30 mg via SUBCUTANEOUS
  Filled 2012-09-07 (×4): qty 0.3

## 2012-09-07 MED ORDER — ALTEPLASE 2 MG IJ SOLR
2.0000 mg | Freq: Once | INTRAMUSCULAR | Status: AC
  Administered 2012-09-07: 2 mg
  Filled 2012-09-07 (×3): qty 2

## 2012-09-07 MED ORDER — FLUCONAZOLE 100 MG PO TABS
100.0000 mg | ORAL_TABLET | Freq: Every day | ORAL | Status: AC
  Administered 2012-09-07 – 2012-09-08 (×2): 100 mg via ORAL
  Filled 2012-09-07 (×3): qty 1

## 2012-09-07 NOTE — Progress Notes (Signed)
Physical Therapy Treatment Patient Details Name: NAVI EWTON MRN: [id number] DOB: 1951/05/18 Today's Date: 09/07/2012 Time: 4098-1191 PT Time Calculation (min): 28 min  PT Assessment / Plan / Recommendation Comments on Treatment Session  Patient eager and motivated to get OOB. Patient states leg feels better after I&D yesterday. Did not attempt ambulation due to decreased HgB. Will continue with current goals set and continue with POC. Discussed with PT.     Follow Up Recommendations  Home health PT;Supervision/Assistance - 24 hour     Does the patient have the potential to tolerate intense rehabilitation     Barriers to Discharge        Equipment Recommendations  None recommended by PT    Recommendations for Other Services    Frequency 7X/week   Plan Discharge plan remains appropriate;Frequency remains appropriate    Precautions / Restrictions Precautions Precautions: Fall Restrictions Weight Bearing Restrictions: Yes RLE Weight Bearing: Weight bearing as tolerated   Pertinent Vitals/Pain     Mobility  Bed Mobility Supine to Sit: 4: Min assist;With rails;HOB flat Details for Bed Mobility Assistance: Requires increase time but patient only needing A for holding of R leg as she brings it off of bed. Will need to attempt without rails in prep for discharge Transfers Transfers: Stand Pivot Transfers Sit to Stand: 4: Min assist;With upper extremity assist;From bed;From elevated surface Stand to Sit: 4: Min assist;With upper extremity assist;To chair/3-in-1 Stand Pivot Transfers: 4: Min assist Details for Transfer Assistance: A to ensure balance and stabiilty. Cues for technique. +1 for safety Ambulation/Gait Ambulation/Gait Assistance: Not tested (comment)    Exercises     PT Diagnosis:    PT Problem List:   PT Treatment Interventions:     PT Goals Acute Rehab PT Goals PT Goal: Supine/Side to Sit - Progress: Progressing toward goal PT Goal: Sit to Stand -  Progress: Progressing toward goal PT Goal: Stand to Sit - Progress: Progressing toward goal PT Goal: Ambulate - Progress: Progressing toward goal  Visit Information  Last PT Received On: 09/07/12 Assistance Needed: +2 (for safety)    Subjective Data      Cognition  Overall Cognitive Status: Appears within functional limits for tasks assessed/performed Arousal/Alertness: Awake/alert Orientation Level: Appears intact for tasks assessed Behavior During Session: [facility] for tasks performed    Balance  Static Sitting Balance Static Sitting - Balance Support: No upper extremity supported;Feet supported Static Sitting - Level of Assistance: 5: Stand by assistance Static Sitting - Comment/# of Minutes: Sat x5 mins in prep for standing to ensure no dizziness or lightheadedness  End of Session PT - End of Session Activity Tolerance: Patient tolerated treatment well;Treatment limited secondary to medical complications (Comment) (HgB 7.4) Patient left: in chair;with call bell/phone within reach Nurse Communication: Mobility status   GP     Fredrich Birks 09/07/2012, 11:50 AM 09/07/2012 Fredrich Birks PTA [phone] pager [phone] office

## 2012-09-07 NOTE — Progress Notes (Signed)
[facility] PROGRESS NOTE  RINNAH PEPPEL ZOX:[id number] DOB: December 23, 1950 DOA: 08/28/2012 PCP: [name], MD  Assessment/Plan: Principal Problem:  *Right anterior knee pain Active Problems:  HYPOTHYROIDISM  DM type 2 with diabetic peripheral neuropathy  ANEMIA-NOS  BIPOLAR AFFECTIVE DISORDER, DEPRESSED  HYPERTENSION  ASTHMA  GERD  Obesity, morbid (more than 100 lbs over ideal weight or BMI > 40)    HPI :*  61 year old Amy Barnes presented with knee pain . She has had severe right knee pain.Onset of symptoms was gradual starting a few weeks ago with rapidly worsening course since that time. The patient noted no past surgery on the left knee. Prior procedures on the knee include none. Patient has been treated conservatively with over-the-counter NSAIDs and activity modification. Patient currently rates pain in the knee at 10 out of 10 with activity. There is pain at night. Patient admitted for pain control. She was found to have acute right knee pain due to medial and lateral meniscal tear, she is unable to stand up or bear weight on it, orthopedics arthroscopic surgical intervention on 09/01/2012.  She had seen Dr. Lovell Sheehan in the office last week. Dr. Lovell Sheehan thought the pain was more likely coming from her knee but he ordered a lumbar MRI just to make sure that there was a significant lumbar spine pathology contributing to her pain. She had not gotten the MRI scan yet and was admitted for pain control. The patient's lumbar spine MRI showed results as above. Details hospital course as follows  HOSPITAL COURSE:  1. Right knee pain, status post, Right knee arthroscopy with partial medial and  lateral meniscectomy.  unable to bear weight on the right leg, in a patient with morbid obesity and MRI evidence of both lateral and medial meniscal tear along with joint effusion - orthopedics and neurosurgery were consulted, continue pain control, further recommendations per orthopedics, for now  patient is unable to stand up or bear weight at all on the right leg, She has been seen by Dr. Valentina Gu on 08-2012 who performed surgery on 09/01/2012 of note patient is on Lovenox for DVT prophylaxis however it is weight-based, she will continue with this for another 2 weeks. Also seen by Dr. Lovell Sheehan neurosurgeon as she has history of L-spine problem, L-spine MRI and Dr. Lovell Sheehan evaluation not consistent with any ongoing L-spine issues. Physical therapy recommended home health physical therapy as well as a rolling walker and bedside commode. Patient to followup with Dr. Sherlean Foot 10 days .  Per Dr. Valentina Gu recommendations the patient had an MRI yesterday discussed results with radiology the patient has a posterior, leg abscess associated with myositis. I have already called Dr. Sherlean Foot multiple times and discussed the findings of the MRI. Dr. Sherlean Foot , she status post ultrasound-guided aspiration 35 cc of purulent material was removed, she has been started on empiric antibiotics. I left message for Dr. Sherlean Foot.Also consulted Dr Magnus Ivan . Status post IRRIGATION AND DEBRIDEMENT EXTREMITY (Right) - I&D of right thigh     2. Diabetes mellitus type 2 morbid obesity - during this hospitalization the patient was treated with pre-meal NovoLog along with sliding scale insulin, she will resume her home medications CBG (last 3)   3. History of hypertension. Continue ARB, monitor blood pressure.  4. Hypothyroidism. Continue home dose Synthroid.   5. Dyslipidemia continue home dose statin.  6. Morbid obesity. Outpatient follow with PCP and dietician.  7.anemia hemoglobin stable at around 7.4, holding Lovenox for DVT prophylaxis , hemoglobin has been trending  down in the outpatient setting. Patient would benefit from a full GI workup to be arranged for by the primary care provider X.   8. Pain control patient on multiple narcotic medications, patient has been advised not to drive for at least a month   9. hyponatremia resolved,  patient has been placed on fluid restriction and a dose of Lasix has been decreased she would need a repeat BMP in one week. Sodium improved to 135, from 128.        HPI/Subjective:   Stable overnight pain is improved  Objective: Filed Vitals:   09/06/12 1710 09/06/12 2202 09/07/12 0216 09/07/12 0524  BP: 161/82 135/53 136/62 130/62  Pulse: 87 69 74 84  Temp: 98.6 F (37 C) 98.9 F (37.2 C) 99 F (37.2 C) 98.9 F (37.2 C)  TempSrc:  Oral Oral Oral  Resp: 18 18 18 18   Height:      Weight:      SpO2: 100% 100% 99% 97%    Intake/Output Summary (Last 24 hours) at 09/07/12 0739 Last data filed at 09/07/12 0132  Gross per 24 hour  Intake 4288.67 ml  Output    100 ml  Net 4188.67 ml    Exam:  HENT:  Head: Atraumatic.  Nose: Nose normal.  Mouth/Throat: Oropharynx is clear and moist.  Eyes: Conjunctivae are normal. Pupils are equal, round, and reactive to light. No scleral icterus.  Neck: Neck supple. No tracheal deviation present.  Cardiovascular: Normal rate, regular rhythm, normal heart sounds and intact distal pulses.  Pulmonary/Chest: Effort normal and breath sounds normal. No respiratory distress.  Abdominal: Soft. Normal appearance and bowel sounds are normal. She exhibits no distension. There is no tenderness.  Musculoskeletal: She exhibits no edema and no tenderness.  Neurological: She is alert. No cranial nerve deficit.    Data Reviewed: Basic Metabolic Panel:  Lab 09/07/12 1191 09/05/12 0525 09/03/12 1015 09/02/12 0910 09/01/12 0610  NA 137 137 135 128* 131*  K 3.8 3.8 4.2 4.3 4.5  CL 99 101 95* 92* 94*  CO2 32 29 29 29 30   GLUCOSE 135* 149* 115* 138* 107*  BUN 6 8 7 10 13   CREATININE 0.50 0.52 0.57 0.70 0.65  CALCIUM 9.1 8.9 9.6 8.5 9.5  MG -- -- -- -- --  PHOS -- -- -- -- --    Liver Function Tests:  Lab 09/05/12 0525  AST 17  ALT 16  ALKPHOS 110  BILITOT 0.2*  PROT 6.3  ALBUMIN 2.2*   No results found for this basename:  LIPASE:5,AMYLASE:5 in the last 168 hours No results found for this basename: AMMONIA:5 in the last 168 hours  CBC:  Lab 09/07/12 0545 09/06/12 0605 09/05/12 0525 09/02/12 0645 09/01/12 0610  WBC 6.6 6.4 6.8 7.7 7.6  NEUTROABS -- -- -- -- --  HGB 7.4* 8.0* 8.1* 9.0* 8.7*  HCT 23.0* 24.8* 25.0* 27.4* 27.0*  MCV 83.0 82.1 82.8 82.8 82.6  PLT 350 391 401* 395 377    Cardiac Enzymes:  Lab 09/04/12 1000  CKTOTAL 18  CKMB --  CKMBINDEX --  TROPONINI --   BNP (last 3 results) No results found for this basename: PROBNP:3 in the last 8760 hours   CBG:  Lab 09/07/12 0639 09/06/12 2117 09/06/12 1616 09/06/12 0843 09/05/12 2257  GLUCAP 122* 103* 136* 110* 122*    Recent Results (from the past 240 hour(s))  SURGICAL PCR SCREEN     Status: Abnormal   Collection Time   08/29/12  5:43 AM      Component Value Range Status Comment   MRSA, PCR POSITIVE (*) NEGATIVE Final    Staphylococcus aureus POSITIVE (*) NEGATIVE Final   URINE CULTURE     Status: Normal   Collection Time   08/30/12  3:11 PM      Component Value Range Status Comment   Specimen Description URINE, CATHETERIZED   Final    Special Requests NONE   Final    Culture  Setup Time 08/30/2012 15:34   Final    Colony Count >=100,000 COLONIES/ML   Final    Culture KLEBSIELLA PNEUMONIAE   Final    Report Status 09/01/2012 FINAL   Final    Organism ID, Bacteria KLEBSIELLA PNEUMONIAE   Final   BODY FLUID CULTURE     Status: Normal (Preliminary result)   Collection Time   09/04/12  2:30 PM      Component Value Range Status Comment   Specimen Description SYNOVIAL RIGHT KNEE   Final    Special Requests NONE   Final    Gram Stain     Final    Value: WBC PRESENT,BOTH PMN AND MONONUCLEAR     NO ORGANISMS SEEN   Culture     Final    Value: MODERATE METHICILLIN RESISTANT STAPHYLOCOCCUS AUREUS     Note: RIFAMPIN AND GENTAMICIN SHOULD NOT BE USED AS SINGLE DRUGS FOR TREATMENT OF STAPH INFECTIONS. CRITICAL RESULT CALLED TO, READ BACK  BY AND VERIFIED WITH: CAROL H @ 1128 09/06/12 BY KRAWS CRITICAL RESULT CALLED TO, READ BACK BY AND VERIFIED WITH:      CAROL H @ 1302 09/06/12 BY KRAWS   Report Status PENDING   Incomplete   CULTURE, BLOOD (ROUTINE X 2)     Status: Normal (Preliminary result)   Collection Time   09/05/12  4:51 PM      Component Value Range Status Comment   Specimen Description BLOOD LEFT ARM   Final    Special Requests BOTTLES DRAWN AEROBIC AND ANAEROBIC 10CC   Final    Culture  Setup Time 09/05/2012 20:57   Final    Culture     Final    Value:        BLOOD CULTURE RECEIVED NO GROWTH TO DATE CULTURE WILL BE HELD FOR 5 DAYS BEFORE ISSUING A FINAL NEGATIVE REPORT   Report Status PENDING   Incomplete   CULTURE, BLOOD (ROUTINE X 2)     Status: Normal (Preliminary result)   Collection Time   09/05/12  4:56 PM      Component Value Range Status Comment   Specimen Description BLOOD LEFT HAND   Final    Special Requests     Final    Value: BOTTLES DRAWN AEROBIC AND ANAEROBIC 10CC AER,8CC ANA   Culture  Setup Time 09/05/2012 20:57   Final    Culture     Final    Value:        BLOOD CULTURE RECEIVED NO GROWTH TO DATE CULTURE WILL BE HELD FOR 5 DAYS BEFORE ISSUING A FINAL NEGATIVE REPORT   Report Status PENDING   Incomplete   GRAM STAIN     Status: Normal   Collection Time   09/06/12 12:00 PM      Component Value Range Status Comment   Specimen Description ABSCESS RIGHT THIGH   Final    Special Requests PATIENT ON FOLLOWING VANCOMYCIN   Final    Gram Stain     Final  Value: RARE WBC PRESENT,BOTH PMN AND MONONUCLEAR     NO ORGANISMS SEEN     CALLED TO DR HANDY 09/06/12 1525 BY K SCHULTZ   Report Status 09/06/2012 FINAL   Final      Studies: Mr Lumbar Spine W Wo Contrast  08/31/2012  *RADIOLOGY REPORT*  Clinical Data: Severe right leg and knee pain.  Lumbar spine surgery 01/08/2012.  MRI LUMBAR SPINE WITHOUT AND WITH CONTRAST  Technique:  Multiplanar and multiecho pulse sequences of the lumbar spine were  obtained without and with intravenous contrast.  Contrast: 20mL MULTIHANCE GADOBENATE DIMEGLUMINE 529 MG/ML IV SOLN  Comparison:  None available.  Findings:  Normal signal is present in the conus medullaris which terminates at L1-2.  Patient is status post L3-4 PLIF.  The marrow signal, vertebral body heights, and alignment are otherwise normal. Limited imaging of the abdomen is unremarkable.  L1-2:  Negative.  L2-3:  Mild facet hypertrophy is present.  There is slight disc bulging.  No significant stenosis is present.  L3-4:  The patient is status post fusion.  Bilateral foraminotomies were performed.  The central canal and foramina are widely patent. The disc spacers are well positioned.  L4-5:  A mild broad-based disc herniation is present.  Facet hypertrophy is noted bilaterally.  This leads to mild distortion of the central canal without significant stenosis.  L5-S1:  A rightward disc herniation potentially contacts the exiting L5 nerve root within the foramen.  IMPRESSION:  1.  Status post L3-4 PLIF without evidence for residual or recurrent stenosis. 2.  Rightward disc herniation at L5-S1 potentially contacts the exiting right L5 nerve root. 3.  Slight disc bulging at to L2-3 and L4-5 without significant stenosis at either level.   Original Report Authenticated By: Marin Roberts, M.D.    Dg Pelvis Portable  09/01/2012  *RADIOLOGY REPORT*  Clinical Data: Right hip pain.  PORTABLE PELVIS  Comparison: None  Findings: There are mild right hip joint degenerative changes but no acute bony findings or plain film evidence of avascular necrosis.  Pubic symphysis and SI joints are intact.  A left hip prosthesis is noted along with lumbar fusion hardware.  IMPRESSION: Degenerative changes but no acute bony findings.   Original Report Authenticated By: Rudie Meyer, M.D.    Mr Femur Right W Wo Contrast  09/04/2012  *RADIOLOGY REPORT*  Clinical Data: Severe right leg pain.  Recent patellar surgery.  MRI RIGHT  THIGH WITHOUT CONTRAST  Technique:  Multiplanar, multisequence MR imaging of the right thigh was performed.  No intravenous contrast was administered.  Comparison:   None  Findings:  There is a large complex the joint effusion with thick irregular synovial enhancement worrisome for septic arthritis.  No definite findings for osteomyelitis.  Extending out from a suprapatellar bursa and along the anterior and lateral aspect of the femur is an elongated abscess.  This extends approximately halfway up the femur measuring a total of approximately 20 cm in length.  There is severe surrounding myositis involving the thigh musculature extending all way up to the hip.  No MR findings to suggest osteomyelitis.  There is surrounding cellulitis.  IMPRESSION:  1.  MR findings most consistent with septic arthritis with dissecting abscess up along the anterior and lateral aspect of the femur with severe surrounding myositis extending all way up to the hip. 2.  No findings to suggest osteomyelitis.   Original Report Authenticated By: Rudie Meyer, M.D.    US Aspiration  09/04/2012  *RADIOLOGY REPORT*  Clinical Data: Deep tissue fluid collection about the right femur. Pain.  Fevers.  ULTRASOUND GUIDED ASPIRATION.  Comparison:  Right femur MRI 09/03/2012.  Sedation:  None  Procedure:  The procedure, risks, benefits, and alternatives were explained to the patient.  Questions regarding the procedure were encouraged and answered.  The patient understands and consents to the procedure.  The anterior right thigh was prepped with betadine in a sterile fashion, and a sterile drape was applied covering the operative field.  A sterile gown and sterile gloves were used for the procedure. Local anesthesia was provided with 1% Lidocaine.  The fluid collection was identified anterior and lateral to the right femur.  Following local anesthesia, a 20 gauge needle was advanced into the fluid collection.  A 30 ml syringe was filled with somewhat  purulent and sanguinous material.  Using a 10 ml syringe I was able to obtain an additional 5 mm of fluid from a slightly more lateral aspect of the collection.  The needle was then removed.  Hemostasis was achieved.  Complications: None.  IMPRESSION:  1.  Technically successful ultrasound guided aspiration of a complex fluid collection adjacent to the right femur. Approximately 35 ml of purulent sanguinous fluid was aspirated. 2.  The fluid was sent for cell count, Gram stain and culture.   Original Report Authenticated By: Marin Roberts, M.D.    Dg Chest Port 1 View  09/01/2012  *RADIOLOGY REPORT*  Clinical Data: Status post central line placement.  PORTABLE CHEST - 1 VIEW  Comparison: Chest radiograph 08/30/2012  Findings: A right IJ central venous catheter is in place.  The distal tip projects over the distal superior vena cava.  Heart and mediastinal contours are stable.  Pulmonary vascular prominence appears similar to recent prior study.  Lung volumes are low.  No focal airspace disease or pleural effusion.  Postsurgical changes of the lower cervical spine.  Negative for pneumothorax.  No subcutaneous gas in the chest wall.  IMPRESSION:  Satisfactory position of right IJ central venous catheter.  No complicating feature.   Original Report Authenticated By: Britta Mccreedy, M.D.    Dg Chest Port 1 View  08/30/2012  *RADIOLOGY REPORT*  Clinical Data: Cough and weakness.  PORTABLE CHEST - 1 VIEW  Comparison: 01/08/2012 and 01/02/2012.  Findings: Central pulmonary vascular prominence without pulmonary edema.  No segmental infiltrate or gross pneumothorax.  Calcified slightly tortuous aorta.  Heart size top normal.  Postsurgical changes lower cervical spine.  IMPRESSION: No acute abnormality.  If there is a persistent unexplained cough, follow-up two-view exam may be considered.   Original Report Authenticated By: Lacy Duverney, M.D.    Dg Knee Right Port  08/29/2012  *RADIOLOGY REPORT*  Clinical Data:  Severe right knee pain.  PORTABLE RIGHT KNEE - 1-2 VIEW  Comparison: None.  Findings: There is marked narrowing of the medial compartment with small marginal osteophytes. Slight sclerosis of the medial compartment.  Lateral and patellofemoral compartments appear essentially normal.  No appreciable joint effusion.  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   Original Report Authenticated By: Francene Boyers, M.D.    Dg Fluoro Guide Ndl Plc/bx  09/04/2012  *RADIOLOGY REPORT*  Clinical Data:  Knee pain.  Possible septic joint.  ARTHROCENTESIS/INJECTION OF LARGE JOINT  Technique: There is some benefits of the procedure were discussed with the patient.  She was given an opportunity to ask questions. Written consent was then obtained.  Time-out was performed for the procedure.  Fluoroscopy  Time: 0.5 minutes.  Comparison:  MRI of the right thigh 09/03/2012.  Findings: The knee was localized under fluoroscopy.  A 20 gauge spinal needle was then advanced into the joint.  Position was confirmed by injecting contrast material.  A single drop of normal appearing joint fluid was aspirated.  7 ml of saline were then injected into the joint and subsequent ratio was attempted but failed.  The patient tolerated the procedure well.  There was no immediate post procedural complication.  Specimen obtained was sent to the laboratory.  IMPRESSION: Knee aspiration as above.  Findings were discussed with Dr. Valentina Gu immediately after procedure.   Original Report Authenticated By: Holley Dexter, M.D.     Scheduled Meds:   . aspirin EC  325 mg Oral Q breakfast  . Chlorhexidine Gluconate Cloth  6 each Topical Q0600  . docusate sodium  300 mg Oral QHS  . fluconazole (DIFLUCAN) IV  100 mg Intravenous Q24H  . [EXPIRED] HYDROmorphone      . [EXPIRED] HYDROmorphone      . imipenem-cilastatin  500 mg Intravenous Q6H  . insulin aspart  0-15 Units Subcutaneous TID WC  . insulin aspart  0-5 Units  Subcutaneous QHS  . insulin aspart  4 Units Subcutaneous TID WC  . lamoTRIgine  200 mg Oral Daily  . levothyroxine  150 mcg Oral QAC breakfast  . LORazepam  0.5 mg Intravenous Once  . [EXPIRED] midazolam      . midazolam  2 mg Intravenous Once  . montelukast  10 mg Oral QHS  . morphine  30 mg Oral Q12H  . mupirocin ointment  1 application Nasal BID  . Oxcarbazepine  300 mg Oral Daily  . OXcarbazepine  600 mg Oral QHS  . [EXPIRED] oxyCODONE      . simvastatin  40 mg Oral QPM  . sodium chloride  10-40 mL Intracatheter Q12H  . sodium chloride  3 mL Intravenous Q12H  . vancomycin  1,500 mg Intravenous Q8H  . [DISCONTINUED] fentaNYL  50-100 mcg Intravenous Once   Continuous Infusions:   . lactated ringers      Principal Problem:  *Right anterior knee pain Active Problems:  HYPOTHYROIDISM  DM type 2 with diabetic peripheral neuropathy  ANEMIA-NOS  BIPOLAR AFFECTIVE DISORDER, DEPRESSED  HYPERTENSION  ASTHMA  GERD  Obesity, morbid (more than 100 lbs over ideal weight or BMI > 40)    Time spent: 40 minutes   [facility]  [facility] Pager [phone]. If 8PM-8AM, please contact night-coverage at [email or web address], password [facility] 09/07/2012, 7:39 AM  LOS: 10 days

## 2012-09-07 NOTE — Progress Notes (Signed)
SPORTS MEDICINE AND JOINT REPLACEMENT  Georgena Spurling, MD   Altamese Cabal, PA-C [address] [place], [place], [place]  [address]                             [phone]   PROGRESS NOTE  Subjective:  negative for Chest Pain  negative for Shortness of Breath  negative for Nausea/Vomiting   negative for Calf Pain  negative for Bowel Movement   Tolerating Diet: yes         Patient reports pain as 5 on 0-10 scale.    Objective: Vital signs in last 24 hours:   Patient Vitals for the past 24 hrs:  BP Temp Temp src Pulse Resp SpO2  09/07/12 1430 111/49 mmHg 98.9 F (37.2 C) Oral 84  18  100 %  09/07/12 1400 120/53 mmHg 99.1 F (37.3 C) Oral 83  16  100 %  09/07/12 1300 108/56 mmHg 98.7 F (37.1 C) Oral 83  18  93 %  09/07/12 1200 119/45 mmHg 99.3 F (37.4 C) Oral 84  18  -  09/07/12 1121 121/56 mmHg 98.8 F (37.1 C) Oral 83  16  98 %  09/07/12 0524 130/62 mmHg 98.9 F (37.2 C) Oral 84  18  97 %  09/07/12 0216 136/62 mmHg 99 F (37.2 C) Oral 74  18  99 %  09/06/12 2202 135/53 mmHg 98.9 F (37.2 C) Oral 69  18  100 %    @flow {1959:LAST@   Intake/Output from previous day:   12/09 0701 - 12/10 0700 In: 4288.7 [I.V.:3038.7] Out: 100    Intake/Output this shift:   12/10 0701 - 12/10 1900 In: 488 [P.O.:480] Out: 600 [Urine:600]   Intake/Output      12/09 0701 - 12/10 0700 12/10 0701 - 12/11 0700   P.O.  480   I.V. (mL/kg) 3038.7 (20)    Other  8   IV Piggyback 1250    Total Intake(mL/kg) 4288.7 (28.2) 488 (3.2)   Urine (mL/kg/hr)  600 (0.4)   Blood 100    Total Output 100 600   Net +4188.7 -112        Urine Occurrence 3 x       LABORATORY DATA:  Basename 09/07/12 0545 09/06/12 0605 09/05/12 0525 09/02/12 0645 09/01/12 0610  WBC 6.6 6.4 6.8 7.7 7.6  HGB 7.4* 8.0* 8.1* 9.0* 8.7*  HCT 23.0* 24.8* 25.0* 27.4* 27.0*  PLT 350 391 401* 395 377    Basename 09/07/12 0545 09/05/12 0525 09/03/12 1015 09/02/12 0910 09/01/12 0610  NA 137 137 135 128* 131*  K 3.8 3.8  4.2 4.3 4.5  CL 99 101 95* 92* 94*  CO2 32 29 29 29 30   BUN 6 8 7 10 13   CREATININE 0.50 0.52 0.57 0.70 0.65  GLUCOSE 135* 149* 115* 138* 107*  CALCIUM 9.1 8.9 9.6 8.5 9.5   Lab Results  Component Value Date   INR 1.18 08/31/2012   INR 1.23 08/29/2012    Examination:  General appearance: alert, cooperative and no distress Extremities: Homans sign is negative, no sign of DVT  Wound Exam: clean, dry, intact   Drainage:  None: wound tissue dry  Motor Exam: EHL and FHL Intact  Sensory Exam: Deep Peroneal normal  Vascular Exam:    Assessment:    1 Day Post-Op  Procedure(s) (LRB): IRRIGATION AND DEBRIDEMENT EXTREMITY (Right)  ADDITIONAL DIAGNOSIS:  Principal Problem:  *Right anterior knee pain  Active Problems:  HYPOTHYROIDISM  DM type 2 with diabetic peripheral neuropathy  ANEMIA-NOS  BIPOLAR AFFECTIVE DISORDER, DEPRESSED  HYPERTENSION  ASTHMA  GERD  Obesity, morbid (more than 100 lbs over ideal weight or BMI > 40)  Acute Blood Loss Anemia   Plan: Physical Therapy as ordered Weight Bearing as Tolerated (WBAT)  DVT Prophylaxis:  Aspirin  DISCHARGE PLAN: Home  DISCHARGE NEEDS: HHPT, Walker and 3-in-1 comode seat  Will change dressing and pull drain tomorrow         Terrie Haring 09/07/2012, 5:13 PM

## 2012-09-07 NOTE — Progress Notes (Signed)
Agree with PTA.    Ronna Herskowitz, PT [phone]  

## 2012-09-07 NOTE — Anesthesia Postprocedure Evaluation (Signed)
  Anesthesia Post-op Note  Patient: Amy Barnes  Procedure(s) Performed: Procedure(s) (LRB) with comments: IRRIGATION AND DEBRIDEMENT EXTREMITY (Right) - I&D of right thigh  Patient Location: PACU  Anesthesia Type:General  Level of Consciousness: awake  Airway and Oxygen Therapy: Patient Spontanous Breathing  Post-op Pain: mild  Post-op Assessment: Post-op Vital signs reviewed, Patient's Cardiovascular Status Stable, Respiratory Function Stable, Patent Airway, No signs of Nausea or vomiting and Pain level controlled  Post-op Vital Signs: stable  Complications: No apparent anesthesia complications

## 2012-09-07 NOTE — Op Note (Signed)
Amy Barnes, Amy Barnes                 ACCOUNT NO.:  [id number]  MEDICAL RECORD NO.:  [id number]  LOCATION:                                 FACILITY:  PHYSICIAN:  Doralee Albino. Carola Frost, M.D. DATE OF BIRTH:  [birth year]  DATE OF PROCEDURE:  09/06/2012 DATE OF DISCHARGE:                              OPERATIVE REPORT   PREOPERATIVE DIAGNOSIS:  Right thigh deep abscess.  POSTOPERATIVE DIAGNOSIS:  Right thigh deep abscess.  PROCEDURE:  Incision and drainage of deep thigh abscess.  SURGEON:  Doralee Albino. Carola Frost, MD  ASSISTANT:  Mearl Latin, PA  ANESTHESIA:  General.  COMPLICATIONS:  None.  TOURNIQUET:  None.  ESTIMATED BLOOD LOSS:  40 mL.  DRAINS:  One medium large Hemovac in the right deep thigh abscess.  SPECIMENS:  Three anaerobic aerobic, fungal cultures sent to Microbiology.  DISPOSITION:  To PACU.  CONDITION:  Stable.  BRIEF SUMMARY OF INDICATION AND PROCEDURE:  Amy Barnes is a 61 year old female, who has had recent past medical history notable for increasing right leg pain and multiple studies and procedures were performed to address same with ultimately the determination of a abscess being present in the deep thigh that did not communicate with the joint.  We discussed preoperatively the risks and benefits of surgery including the possibility of failure to resolve the infection, failure to alleviate all symptoms, recurrence of infection, need for further surgery, DVT, PE, heart attack, stroke, and multiple others and the patient did wish to proceed.  I also discussed this case in detail with Dr. Valentina Barnes, who because of predetermined logistics requested my involvement and performance of the procedure which I was more than happy to assist with.  BRIEF SUMMARY OF PROCEDURE:  Amy Barnes remained on both vancomycin and Diflucan.  This appeared to have been started on the day of her aspiration from Radiology and did preceded.  After induction of general anesthesia and standard  prep and drape of the right lower extremity, a 5 cm incision was made over the right distal lateral thigh.  Dissection carried down to the IT band which was clearly identified and divided longitudinally.  This revealed the vastus underneath that did not encounter any purulence at that time.  I then used a long blunt Kelly to penetrate the abscess wall deep within the vastus and obtained multiple cultures.  We evacuated over 70 mL and did develop the entire abscess pocket proximally and distally.  It did seem to track posteriorly to some extent as well which we were able to access.  This was followed by irrigation with 3000 mL of saline using cystoscopy tubing to provide for high volume low pressure lavage.  The thigh and soft tissues were agitated during this to further facilitate adequate debridement.  A medium large Hemovac was then placed under direct visualization into this pocket and brought out separately and more distally so that it would be in the most dependent position to harness the effects of gravity in addition to suction for promotion of drainage.  Wound was irrigated thoroughly and closed in standard layered fashion.  I did leave the deep fascia open as it had been split longitudinally and  to facilitate drainage.  Deep subcu was reapproximated with 0 Vicryl, 2-0 Vicryl, and 2-0 and 3-0 nylon for the skin.  Sterile gently compressive dressing was applied.  The patient was awakened from anesthesia and transported to the PACU in stable condition.  Amy Morita, PA-C did assist me throughout the procedure, supplying retraction given the patient's severely elevated BMI.  PROGNOSIS:  Amy Barnes will be weightbearing as tolerated on the right lower extremity.  She will continue with IV antibiotics and switch to oral as indicated by the primary service.  She will remain under the care of Dr. Sherlean Barnes and of course I will be more than willing to assist with management and follow up as  directed by Dr. Sherlean Barnes as well.  Because of her diabetes and size, she is certainly at increased risk for perioperative complications including the persistence of infection and need for reoperation or repeat drainage.     Doralee Albino. Carola Frost, M.D.     MHH/MEDQ  D:  09/06/2012  T:  09/07/2012  Job:  [id number]

## 2012-09-08 DIAGNOSIS — L0390 Cellulitis, unspecified: Secondary | ICD-10-CM

## 2012-09-08 DIAGNOSIS — A4902 Methicillin resistant Staphylococcus aureus infection, unspecified site: Secondary | ICD-10-CM

## 2012-09-08 DIAGNOSIS — L0291 Cutaneous abscess, unspecified: Secondary | ICD-10-CM

## 2012-09-08 DIAGNOSIS — M009 Pyogenic arthritis, unspecified: Secondary | ICD-10-CM

## 2012-09-08 LAB — GLUCOSE, CAPILLARY
Glucose-Capillary: 122 mg/dL — ABNORMAL HIGH (ref 70–99)
Glucose-Capillary: 91 mg/dL (ref 70–99)
Glucose-Capillary: 130 mg/dL — ABNORMAL HIGH (ref 70–99)
Glucose-Capillary: 115 mg/dL — ABNORMAL HIGH (ref 70–99)

## 2012-09-08 LAB — CBC
HCT: 24 % — ABNORMAL LOW (ref 36.0–46.0)
Hemoglobin: 7.7 g/dL — ABNORMAL LOW (ref 12.0–15.0)
MCH: 26.4 pg (ref 26.0–34.0)
MCHC: 32.1 g/dL (ref 30.0–36.0)
MCV: 82.2 fL (ref 78.0–100.0)
Platelets: 358 10*3/uL (ref 150–400)
RBC: 2.92 MIL/uL — ABNORMAL LOW (ref 3.87–5.11)
RDW: 14.7 % (ref 11.5–15.5)
WBC: 6.2 10*3/uL (ref 4.0–10.5)

## 2012-09-08 LAB — TYPE AND SCREEN
ABO/RH(D): A POS
Antibody Screen: POSITIVE
DAT, IgG: NEGATIVE
Donor AG Type: NEGATIVE
Unit division: 0

## 2012-09-08 NOTE — Progress Notes (Signed)
Physical Therapy Treatment Patient Details Name: BRITLEY GASHI MRN: [id number] DOB: October 16, 1950 Today's Date: 09/08/2012 Time: 6213-0865 PT Time Calculation (min): 22 min  PT Assessment / Plan / Recommendation Comments on Treatment Session  Patient progressing well with mobility. Motivated. Will continue with current POC    Follow Up Recommendations  Home health PT;Supervision/Assistance - 24 hour     Does the patient have the potential to tolerate intense rehabilitation     Barriers to Discharge        Equipment Recommendations  None recommended by PT    Recommendations for Other Services    Frequency 7X/week   Plan Discharge plan remains appropriate;Frequency remains appropriate    Precautions / Restrictions Precautions Precautions: Fall Restrictions Weight Bearing Restrictions: Yes RLE Weight Bearing: Weight bearing as tolerated   Pertinent Vitals/Pain     Mobility  Bed Mobility Bed Mobility: Supine to Sit;Sitting - Scoot to Edge of Bed Supine to Sit: 4: Min assist Sitting - Scoot to Edge of Bed: 4: Min assist Details for Bed Mobility Assistance: A to hold R LE. Heavy reliance on trapeze bar Transfers Sit to Stand: 4: Min guard;With upper extremity assist;From bed Stand to Sit: 4: Min guard;With upper extremity assist;To chair/3-in-1 Details for Transfer Assistance: Cues for safe technique Ambulation/Gait Ambulation/Gait Assistance: 4: Min assist Ambulation Distance (Feet): 15 Feet Assistive device: Rolling walker Ambulation/Gait Assistance Details: forward and backwards with A for stability Gait Pattern: Step-to pattern;Decreased step length - right;Decreased step length - left;Trunk flexed    Exercises     PT Diagnosis:    PT Problem List:   PT Treatment Interventions:     PT Goals Acute Rehab PT Goals PT Goal: Supine/Side to Sit - Progress: Progressing toward goal PT Goal: Sit to Supine/Side - Progress: Progressing toward goal PT Goal: Sit to Stand -  Progress: Progressing toward goal PT Goal: Stand to Sit - Progress: Progressing toward goal PT Goal: Ambulate - Progress: Progressing toward goal  Visit Information  Last PT Received On: 09/08/12 Assistance Needed: +1    Subjective Data      Cognition  Overall Cognitive Status: Appears within functional limits for tasks assessed/performed Arousal/Alertness: Awake/alert Orientation Level: Appears intact for tasks assessed Behavior During Session: [facility] for tasks performed    Balance  Balance Balance Assessed: No  End of Session PT - End of Session Equipment Utilized During Treatment: Gait belt Activity Tolerance: Patient tolerated treatment well Patient left: in chair;with call bell/phone within reach Nurse Communication: Mobility status   GP     Fredrich Birks 09/08/2012, 12:15 PM  09/08/2012 Fredrich Birks PTA [phone] pager [phone] office

## 2012-09-08 NOTE — Progress Notes (Addendum)
[facility] PROGRESS NOTE  Amy Barnes FAO:[id number] DOB: 07-14-1951 DOA: 08/28/2012 PCP: [name], MD  Assessment/Plan: Principal Problem:  *Right anterior knee pain Active Problems:  HYPOTHYROIDISM  DM type 2 with diabetic peripheral neuropathy  ANEMIA-NOS  BIPOLAR AFFECTIVE DISORDER, DEPRESSED  HYPERTENSION  ASTHMA  GERD  Obesity, morbid (more than 100 lbs over ideal weight or BMI > 40)   HPI :*  61 year old Amy Barnes female presented with knee pain . . . Patient admitted for pain control. She was found to have acute right knee pain due to medial and lateral meniscal tear, she is unable to stand up or bear weight on it, orthopedics arthroscopic surgical intervention on 09/01/2012. She had Right knee arthroscopy with partial medial and lateral meniscectomy.  She had seen Dr. Lovell Sheehan in the office last week. Dr. Lovell Sheehan thought the pain was more likely coming from her knee but he ordered a lumbar MRI just to make sure that there was a significant lumbar spine pathology contributing to her pain. She had not gotten the MRI scan yet and was admitted for pain control. The patient's lumbar spine MRI showed results as above. Details hospital course as follows    HOSPITAL COURSE:  1. Right knee pain, status post, Right knee arthroscopy with partial medial and  lateral meniscectomy. unable to bear weight on the right leg, in a patient with morbid obesity and MRI evidence of both lateral and medial meniscal tear along with joint effusion - orthopedics and neurosurgery were consulted,   She has been seen by Dr. Valentina Gu on 08-2012 who performed surgery on 09/01/2012 of note patient is on Lovenox for DVT prophylaxis however it is weight-based, she will continue with this for another 2 weeks. Also seen by Dr. Lovell Sheehan neurosurgeon as she has history of L-spine problem, L-spine MRI and Dr. Lovell Sheehan evaluation not consistent with any ongoing L-spine issues.  Per Dr. Valentina Gu recommendations the patient  had an MRI   discussed results with radiology the patient has a posterior, leg abscess associated with myositis. I have already called Dr. Sherlean Foot multiple times and discussed the findings of the MRI. Dr. Sherlean Foot , she status post ultrasound-guided aspiration 35 cc of purulent material was removed on12/03/2012  , she has been started on empiric antibiotics vancomycin and imipenem.  Status post IRRIGATION AND DEBRIDEMENT EXTREMITY (Right) - I&D of right thigh on 09/07/2012 . Infectious disease has been consulted for antibiotic management. We'll discontinue central line and place a PICC line. Physical therapy recommended home health physical therapy as well as a rolling walker and bedside commode, however the patient will need at least 4-6 weeks of IV antibiotics because of septic arthritis of the knee. She may need to go to SNF.  Based on infectious disease recommendations I called Dr.Lucy , and notified him that the patient needs to have her knee joint washed out because of the positive MRSA. I placed the call on 12/11 at 1:37 PM    2. Diabetes mellitus type 2 morbid obesity - during this hospitalization the patient was treated with pre-meal NovoLog along with sliding scale insulin, she will resume her home medications    3. History of hypertension. ARB discontinued because of marginally low blood pressure, monitor blood pressure.  4. Hypothyroidism. Continue home dose Synthroid.  5. Dyslipidemia continue home dose statin.  6. Morbid obesity. Outpatient follow with PCP and dietician.   7.anemia hemoglobin increased from 7.4 to 7.7 after one unit of packed red blood cells, holding Lovenox for DVT  prophylaxis , hemoglobin has been trending down in the outpatient setting. Patient would benefit from a full GI workup to be arranged for by the primary care provider. 8. Pain control patient on multiple narcotic medications, patient has been advised not to drive for at least a month  9. hyponatremia resolved,  patient has been placed on fluid restriction and a dose of Lasix has been decreased she would need a repeat BMP in one week. Sodium improved to 137, from 128.        HPI/Subjective:  Stable overnight pain is improved   Objective: Filed Vitals:   09/07/12 2229 09/08/12 0000 09/08/12 0400 09/08/12 0635  BP: 107/48   111/55  Pulse: 88   82  Temp: 99.2 F (37.3 C)   98.2 F (36.8 C)  TempSrc: Oral     Resp:  18 18 18   Height:      Weight:      SpO2: 99% 99% 99% 99%    Intake/Output Summary (Last 24 hours) at 09/08/12 0816 Last data filed at 09/07/12 2100  Gross per 24 hour  Intake   1460 ml  Output   1000 ml  Net    460 ml    Exam:  HENT:  Head: Atraumatic.  Nose: Nose normal.  Mouth/Throat: Oropharynx is clear and moist.  Eyes: Conjunctivae are normal. Pupils are equal, round, and reactive to light. No scleral icterus.  Neck: Neck supple. No tracheal deviation present.  Cardiovascular: Normal rate, regular rhythm, normal heart sounds and intact distal pulses.  Pulmonary/Chest: Effort normal and breath sounds normal. No respiratory distress.  Abdominal: Soft. Normal appearance and bowel sounds are normal. She exhibits no distension. There is no tenderness.  Musculoskeletal: She exhibits no edema and no tenderness.  Neurological: She is alert. No cranial nerve deficit.    Data Reviewed: Basic Metabolic Panel:  Lab 09/07/12 4098 09/05/12 0525 09/03/12 1015 09/02/12 0910  NA 137 137 135 128*  K 3.8 3.8 4.2 4.3  CL 99 101 95* 92*  CO2 32 29 29 29   GLUCOSE 135* 149* 115* 138*  BUN 6 8 7 10   CREATININE 0.50 0.52 0.57 0.70  CALCIUM 9.1 8.9 9.6 8.5  MG -- -- -- --  PHOS -- -- -- --    Liver Function Tests:  Lab 09/05/12 0525  AST 17  ALT 16  ALKPHOS 110  BILITOT 0.2*  PROT 6.3  ALBUMIN 2.2*   No results found for this basename: LIPASE:5,AMYLASE:5 in the last 168 hours No results found for this basename: AMMONIA:5 in the last 168 hours  CBC:  Lab  09/08/12 0500 09/07/12 0545 09/06/12 0605 09/05/12 0525 09/02/12 0645  WBC 6.2 6.6 6.4 6.8 7.7  NEUTROABS -- -- -- -- --  HGB 7.7* 7.4* 8.0* 8.1* 9.0*  HCT 24.0* 23.0* 24.8* 25.0* 27.4*  MCV 82.2 83.0 82.1 82.8 82.8  PLT 358 350 391 401* 395    Cardiac Enzymes:  Lab 09/04/12 1000  CKTOTAL 18  CKMB --  CKMBINDEX --  TROPONINI --   BNP (last 3 results) No results found for this basename: PROBNP:3 in the last 8760 hours   CBG:  Lab 09/08/12 0637 09/07/12 2230 09/07/12 1621 09/07/12 1107 09/07/12 0639  GLUCAP 122* 113* 94 114* 122*    Recent Results (from the past 240 hour(s))  URINE CULTURE     Status: Normal   Collection Time   08/30/12  3:11 PM      Component Value Range Status Comment  Specimen Description URINE, CATHETERIZED   Final    Special Requests NONE   Final    Culture  Setup Time 08/30/2012 15:34   Final    Colony Count >=100,000 COLONIES/ML   Final    Culture KLEBSIELLA PNEUMONIAE   Final    Report Status 09/01/2012 FINAL   Final    Organism ID, Bacteria KLEBSIELLA PNEUMONIAE   Final   BODY FLUID CULTURE     Status: Normal   Collection Time   09/04/12  2:30 PM      Component Value Range Status Comment   Specimen Description SYNOVIAL RIGHT KNEE   Final    Special Requests NONE   Final    Gram Stain     Final    Value: WBC PRESENT,BOTH PMN AND MONONUCLEAR     NO ORGANISMS SEEN   Culture     Final    Value: MODERATE METHICILLIN RESISTANT STAPHYLOCOCCUS AUREUS     Note: RIFAMPIN AND GENTAMICIN SHOULD NOT BE USED AS SINGLE DRUGS FOR TREATMENT OF STAPH INFECTIONS. CRITICAL RESULT CALLED TO, READ BACK BY AND VERIFIED WITH: CAROL H @ 1128 09/06/12 BY KRAWS CRITICAL RESULT CALLED TO, READ BACK BY AND VERIFIED WITH:      CAROL H @ 1302 09/06/12 BY KRAWS   Report Status 09/07/2012 FINAL   Final    Organism ID, Bacteria METHICILLIN RESISTANT STAPHYLOCOCCUS AUREUS   Final   CULTURE, BLOOD (ROUTINE X 2)     Status: Normal (Preliminary result)   Collection Time    09/05/12  4:51 PM      Component Value Range Status Comment   Specimen Description BLOOD LEFT ARM   Final    Special Requests BOTTLES DRAWN AEROBIC AND ANAEROBIC 10CC   Final    Culture  Setup Time 09/05/2012 20:57   Final    Culture     Final    Value:        BLOOD CULTURE RECEIVED NO GROWTH TO DATE CULTURE WILL BE HELD FOR 5 DAYS BEFORE ISSUING A FINAL NEGATIVE REPORT   Report Status PENDING   Incomplete   CULTURE, BLOOD (ROUTINE X 2)     Status: Normal (Preliminary result)   Collection Time   09/05/12  4:56 PM      Component Value Range Status Comment   Specimen Description BLOOD LEFT HAND   Final    Special Requests     Final    Value: BOTTLES DRAWN AEROBIC AND ANAEROBIC 10CC AER,8CC ANA   Culture  Setup Time 09/05/2012 20:57   Final    Culture     Final    Value:        BLOOD CULTURE RECEIVED NO GROWTH TO DATE CULTURE WILL BE HELD FOR 5 DAYS BEFORE ISSUING A FINAL NEGATIVE REPORT   Report Status PENDING   Incomplete   ANAEROBIC CULTURE     Status: Normal (Preliminary result)   Collection Time   09/06/12 12:00 PM      Component Value Range Status Comment   Specimen Description ABSCESS RIGHT THIGH   Final    Special Requests PATIENT ON FOLLOWING VANCOMYCIN   Final    Gram Stain PENDING   Incomplete    Culture     Final    Value: NO ANAEROBES ISOLATED; CULTURE IN PROGRESS FOR 5 DAYS   Report Status PENDING   Incomplete   CULTURE, ROUTINE-ABSCESS     Status: Normal (Preliminary result)   Collection Time   09/06/12 12:00  PM      Component Value Range Status Comment   Specimen Description ABSCESS RIGHT THIGH   Final    Special Requests PATIENT ON FOLLOWING VANCOMYCIN   Final    Gram Stain     Final    Value: RARE WBC PRESENT, PREDOMINANTLY PMN     RARE SQUAMOUS EPITHELIAL CELLS PRESENT     NO ORGANISMS SEEN   Culture NO GROWTH 1 DAY   Final    Report Status PENDING   Incomplete   FUNGUS CULTURE W SMEAR     Status: Normal (Preliminary result)   Collection Time   09/06/12 12:00 PM       Component Value Range Status Comment   Specimen Description ABSCESS RIGHT THIGH   Final    Special Requests PATIENT ON FOLLOWING VANCOMYCIN   Final    Fungal Smear NO YEAST OR FUNGAL ELEMENTS SEEN   Final    Culture CULTURE IN PROGRESS FOR FOUR WEEKS   Final    Report Status PENDING   Incomplete   GRAM STAIN     Status: Normal   Collection Time   09/06/12 12:00 PM      Component Value Range Status Comment   Specimen Description ABSCESS RIGHT THIGH   Final    Special Requests PATIENT ON FOLLOWING VANCOMYCIN   Final    Gram Stain     Final    Value: RARE WBC PRESENT,BOTH PMN AND MONONUCLEAR     NO ORGANISMS SEEN     CALLED TO DR HANDY 09/06/12 1525 BY K SCHULTZ   Report Status 09/06/2012 FINAL   Final      Studies: Mr Lumbar Spine W Wo Contrast  08/31/2012  *RADIOLOGY REPORT*  Clinical Data: Severe right leg and knee pain.  Lumbar spine surgery 01/08/2012.  MRI LUMBAR SPINE WITHOUT AND WITH CONTRAST  Technique:  Multiplanar and multiecho pulse sequences of the lumbar spine were obtained without and with intravenous contrast.  Contrast: 20mL MULTIHANCE GADOBENATE DIMEGLUMINE 529 MG/ML IV SOLN  Comparison:  None available.  Findings:  Normal signal is present in the conus medullaris which terminates at L1-2.  Patient is status post L3-4 PLIF.  The marrow signal, vertebral body heights, and alignment are otherwise normal. Limited imaging of the abdomen is unremarkable.  L1-2:  Negative.  L2-3:  Mild facet hypertrophy is present.  There is slight disc bulging.  No significant stenosis is present.  L3-4:  The patient is status post fusion.  Bilateral foraminotomies were performed.  The central canal and foramina are widely patent. The disc spacers are well positioned.  L4-5:  A mild broad-based disc herniation is present.  Facet hypertrophy is noted bilaterally.  This leads to mild distortion of the central canal without significant stenosis.  L5-S1:  A rightward disc herniation potentially  contacts the exiting L5 nerve root within the foramen.  IMPRESSION:  1.  Status post L3-4 PLIF without evidence for residual or recurrent stenosis. 2.  Rightward disc herniation at L5-S1 potentially contacts the exiting right L5 nerve root. 3.  Slight disc bulging at to L2-3 and L4-5 without significant stenosis at either level.   Original Report Authenticated By: Marin Roberts, M.D.    Dg Pelvis Portable  09/01/2012  *RADIOLOGY REPORT*  Clinical Data: Right hip pain.  PORTABLE PELVIS  Comparison: None  Findings: There are mild right hip joint degenerative changes but no acute bony findings or plain film evidence of avascular necrosis.  Pubic symphysis and SI joints  are intact.  A left hip prosthesis is noted along with lumbar fusion hardware.  IMPRESSION: Degenerative changes but no acute bony findings.   Original Report Authenticated By: Rudie Meyer, M.D.    Mr Femur Right W Wo Contrast  09/04/2012  *RADIOLOGY REPORT*  Clinical Data: Severe right leg pain.  Recent patellar surgery.  MRI RIGHT THIGH WITHOUT CONTRAST  Technique:  Multiplanar, multisequence MR imaging of the right thigh was performed.  No intravenous contrast was administered.  Comparison:   None  Findings:  There is a large complex the joint effusion with thick irregular synovial enhancement worrisome for septic arthritis.  No definite findings for osteomyelitis.  Extending out from a suprapatellar bursa and along the anterior and lateral aspect of the femur is an elongated abscess.  This extends approximately halfway up the femur measuring a total of approximately 20 cm in length.  There is severe surrounding myositis involving the thigh musculature extending all way up to the hip.  No MR findings to suggest osteomyelitis.  There is surrounding cellulitis.  IMPRESSION:  1.  MR findings most consistent with septic arthritis with dissecting abscess up along the anterior and lateral aspect of the femur with severe surrounding myositis  extending all way up to the hip. 2.  No findings to suggest osteomyelitis.   Original Report Authenticated By: Rudie Meyer, M.D.    US Aspiration  09/04/2012  *RADIOLOGY REPORT*  Clinical Data: Deep tissue fluid collection about the right femur. Pain.  Fevers.  ULTRASOUND GUIDED ASPIRATION.  Comparison:  Right femur MRI 09/03/2012.  Sedation:  None  Procedure:  The procedure, risks, benefits, and alternatives were explained to the patient.  Questions regarding the procedure were encouraged and answered.  The patient understands and consents to the procedure.  The anterior right thigh was prepped with betadine in a sterile fashion, and a sterile drape was applied covering the operative field.  A sterile gown and sterile gloves were used for the procedure. Local anesthesia was provided with 1% Lidocaine.  The fluid collection was identified anterior and lateral to the right femur.  Following local anesthesia, a 20 gauge needle was advanced into the fluid collection.  A 30 ml syringe was filled with somewhat purulent and sanguinous material.  Using a 10 ml syringe I was able to obtain an additional 5 mm of fluid from a slightly more lateral aspect of the collection.  The needle was then removed.  Hemostasis was achieved.  Complications: None.  IMPRESSION:  1.  Technically successful ultrasound guided aspiration of a complex fluid collection adjacent to the right femur. Approximately 35 ml of purulent sanguinous fluid was aspirated. 2.  The fluid was sent for cell count, Gram stain and culture.   Original Report Authenticated By: Marin Roberts, M.D.    Dg Chest Port 1 View  09/01/2012  *RADIOLOGY REPORT*  Clinical Data: Status post central line placement.  PORTABLE CHEST - 1 VIEW  Comparison: Chest radiograph 08/30/2012  Findings: A right IJ central venous catheter is in place.  The distal tip projects over the distal superior vena cava.  Heart and mediastinal contours are stable.  Pulmonary vascular  prominence appears similar to recent prior study.  Lung volumes are low.  No focal airspace disease or pleural effusion.  Postsurgical changes of the lower cervical spine.  Negative for pneumothorax.  No subcutaneous gas in the chest wall.  IMPRESSION:  Satisfactory position of right IJ central venous catheter.  No complicating feature.   Original Report  Authenticated By: Britta Mccreedy, M.D.    Dg Chest Port 1 View  08/30/2012  *RADIOLOGY REPORT*  Clinical Data: Cough and weakness.  PORTABLE CHEST - 1 VIEW  Comparison: 01/08/2012 and 01/02/2012.  Findings: Central pulmonary vascular prominence without pulmonary edema.  No segmental infiltrate or gross pneumothorax.  Calcified slightly tortuous aorta.  Heart size top normal.  Postsurgical changes lower cervical spine.  IMPRESSION: No acute abnormality.  If there is a persistent unexplained cough, follow-up two-view exam may be considered.   Original Report Authenticated By: Lacy Duverney, M.D.    Dg Knee Right Port  08/29/2012  *RADIOLOGY REPORT*  Clinical Data: Severe right knee pain.  PORTABLE RIGHT KNEE - 1-2 VIEW  Comparison: None.  Findings: There is marked narrowing of the medial compartment with small marginal osteophytes. Slight sclerosis of the medial compartment.  Lateral and patellofemoral compartments appear essentially normal.  No appreciable joint effusion.  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   Original Report Authenticated By: Francene Boyers, M.D.    Dg Fluoro Guide Ndl Plc/bx  09/04/2012  *RADIOLOGY REPORT*  Clinical Data:  Knee pain.  Possible septic joint.  ARTHROCENTESIS/INJECTION OF LARGE JOINT  Technique: There is some benefits of the procedure were discussed with the patient.  She was given an opportunity to ask questions. Written consent was then obtained.  Time-out was performed for the procedure.  Fluoroscopy Time: 0.5 minutes.  Comparison:  MRI of the right thigh 09/03/2012.  Findings: The  knee was localized under fluoroscopy.  A 20 gauge spinal needle was then advanced into the joint.  Position was confirmed by injecting contrast material.  A single drop of normal appearing joint fluid was aspirated.  7 ml of saline were then injected into the joint and subsequent ratio was attempted but failed.  The patient tolerated the procedure well.  There was no immediate post procedural complication.  Specimen obtained was sent to the laboratory.  IMPRESSION: Knee aspiration as above.  Findings were discussed with Dr. Valentina Gu immediately after procedure.   Original Report Authenticated By: Holley Dexter, M.D.     Scheduled Meds:   . [COMPLETED] alteplase  2 mg Intracatheter Once  . [COMPLETED] Chlorhexidine Gluconate Cloth  6 each Topical Q0600  . docusate sodium  300 mg Oral QHS  . enoxaparin (LOVENOX) injection  30 mg Subcutaneous Q24H  . fluconazole  100 mg Oral Daily  . insulin aspart  0-15 Units Subcutaneous TID WC  . insulin aspart  0-5 Units Subcutaneous QHS  . insulin aspart  4 Units Subcutaneous TID WC  . lamoTRIgine  200 mg Oral Daily  . levothyroxine  150 mcg Oral QAC breakfast  . LORazepam  0.5 mg Intravenous Once  . midazolam  2 mg Intravenous Once  . montelukast  10 mg Oral QHS  . morphine  30 mg Oral Q12H  . mupirocin ointment  1 application Nasal BID  . Oxcarbazepine  300 mg Oral Daily  . OXcarbazepine  600 mg Oral QHS  . simvastatin  40 mg Oral QPM  . sodium chloride  10-40 mL Intracatheter Q12H  . sodium chloride  3 mL Intravenous Q12H  . vancomycin  1,500 mg Intravenous Q8H  . [DISCONTINUED] fluconazole (DIFLUCAN) IV  100 mg Intravenous Q24H  . [DISCONTINUED] imipenem-cilastatin  500 mg Intravenous Q6H   Continuous Infusions:   . lactated ringers      Principal Problem:  *Right anterior knee pain Active Problems:  HYPOTHYROIDISM  DM type  2 with diabetic peripheral neuropathy  ANEMIA-NOS  BIPOLAR AFFECTIVE DISORDER, DEPRESSED  HYPERTENSION  ASTHMA   GERD  Obesity, morbid (more than 100 lbs over ideal weight or BMI > 40)    Time spent: 40 minutes   [facility]  [facility] Pager [phone]. If 8PM-8AM, please contact night-coverage at [email or web address], password [facility] 09/08/2012, 8:16 AM  LOS: 11 days

## 2012-09-08 NOTE — Consult Note (Signed)
[facility]  Total days of antibiotics 5        (4 days of zosyn)        Day 5 Vancomycin               Reason for Consult: Right thigh abscess with MRSA.   Referring Physician: Dr. Susie Cassette  Principal Problem:  *Right anterior knee pain Active Problems:  HYPOTHYROIDISM  DM type 2 with diabetic peripheral neuropathy  ANEMIA-NOS  BIPOLAR AFFECTIVE DISORDER, DEPRESSED  HYPERTENSION  ASTHMA  GERD  Obesity, morbid (more than 100 lbs over ideal weight or BMI > 40)    HPI: Amy Barnes is a 61 y.o. female with a past medical history of HTN, DM type 2, hyperlipidemia, obesity, hypothyroidism, bipolar disorder, and multiple orthopedic surgeries who presented to the ER on 08/28/12 with severe knee and thigh pain and was unable to bear weight on right leg. [facility] done before admission show medial and later meniscal tear.  Dr. Sherlean Foot saw patient and planned for meniscotomy, which was done arthroscopically on 09/01/12.  Patient still complained of terrible pain after surgery in her thigh.  MRI of the femur was done on 09/04/12 that showed findings consistent with septic arthritis of the knee and an elongated abscess on the lateral side of the femur.  Knee was aspirated on 09/04/12 by Dr. Maricela Curet and a single drop of normal appearing joint fluid was obtained.  On the same day, Dr. Alfredo Batty aspirated 35cc of purulent sanguinous fluid from a location approximately 20 cm above the patella. This aspirate shows MRSA.  Dr. Carola Frost did an I&D of this abscess on 09/07/12, cultures showing Staph Aureus.   Past Medical History  Diagnosis Date  . Anemia   . Osteoarthritis   . Asthma   . Diabetes mellitus   . Hypothyroid   . GERD (gastroesophageal reflux disease)   . Allergic rhinitis   . Hypertension   . Bipolar disorder     takes Synthroid meds for Bipolar  . Shortness of breath   . Sleep apnea     CPAP    Allergies:  Allergies  Allergen Reactions  .  Cephalexin Hives  . Hydrocodone   . Risperidone And Related   . Seroquel (Quetiapine Fumarate)   . Sulfonamide Derivatives     REACTION: Rash    Current antibiotics: Vancomycin 12/7>>>  MEDICATIONS:    . [COMPLETED] alteplase  2 mg Intracatheter Once  . [COMPLETED] Chlorhexidine Gluconate Cloth  6 each Topical Q0600  . docusate sodium  300 mg Oral QHS  . enoxaparin (LOVENOX) injection  30 mg Subcutaneous Q24H  . [COMPLETED] fluconazole  100 mg Oral Daily  . insulin aspart  0-15 Units Subcutaneous TID WC  . insulin aspart  0-5 Units Subcutaneous QHS  . insulin aspart  4 Units Subcutaneous TID WC  . lamoTRIgine  200 mg Oral Daily  . levothyroxine  150 mcg Oral QAC breakfast  . LORazepam  0.5 mg Intravenous Once  . midazolam  2 mg Intravenous Once  . montelukast  10 mg Oral QHS  . morphine  30 mg Oral Q12H  . mupirocin ointment  1 application Nasal BID  . Oxcarbazepine  300 mg Oral Daily  . OXcarbazepine  600 mg Oral QHS  . simvastatin  40 mg Oral QPM  . sodium chloride  10-40 mL Intracatheter Q12H  . sodium chloride  3 mL Intravenous Q12H  . vancomycin  1,500 mg  Intravenous Q8H  . [DISCONTINUED] imipenem-cilastatin  500 mg Intravenous Q6H    History  Substance Use Topics  . Smoking status: Former Smoker -- 1.5 packs/day    Types: Cigarettes    Quit date: 04/29/2009  . Smokeless tobacco: Never Used  . Alcohol Use: Yes     Comment: very rare    Family History  Problem Relation Age of Onset  . Aneurysm Father   . Alcohol abuse Father   . Heart attack      Siblings  . Coronary artery disease      Family history 1st degree relative <50/Family history 1st degree relative <60/Siblings  . Anesthesia problems Neg Hx   . Hypotension Neg Hx   . Malignant hyperthermia Neg Hx   . Pseudochol deficiency Neg Hx     Review of Systems - General ROS: negative Respiratory ROS: no cough, shortness of breath, or wheezing Cardiovascular ROS: no chest pain or dyspnea on  exertion Gastrointestinal ROS: no abdominal pain, change in bowel habits, or black or bloody stools Musculoskeletal ROS: positive for - pain in leg - right   OBJECTIVE: Temp:  [98.2 F (36.8 C)-99.2 F (37.3 C)] 98.2 F (36.8 C) (12/11 0635) Pulse Rate:  [82-88] 82  (12/11 0635) Resp:  [16-18] 18  (12/11 0635) BP: (107-120)/(48-56) 111/55 mmHg (12/11 0635) SpO2:  [93 %-100 %] 99 % (12/11 0635) General appearance: alert, cooperative and mild distress Resp: clear to auscultation bilaterally Cardio: regular rate and rhythm, S1, S2 normal, no murmur, click, rub or gallop GI: soft, non-tender; bowel sounds normal; no masses,  no organomegaly Extremities: Right leg is wrapped from proximal thigh to foot, dressing is dry and intact Skin: Skin color, texture, turgor normal. No rashes or lesions Neurologic: Grossly normal  LABS: Results for orders placed during the hospital encounter of 08/28/12 (from the past 48 hour(s))  GLUCOSE, CAPILLARY     Status: Abnormal   Collection Time   09/06/12  4:16 PM      Component Value Range Comment   Glucose-Capillary 136 (*) 70 - 99 mg/dL   GLUCOSE, CAPILLARY     Status: Abnormal   Collection Time   09/06/12  9:17 PM      Component Value Range Comment   Glucose-Capillary 103 (*) 70 - 99 mg/dL   CBC     Status: Abnormal   Collection Time   09/07/12  5:45 AM      Component Value Range Comment   WBC 6.6  4.0 - 10.5 K/uL    RBC 2.77 (*) 3.87 - 5.11 MIL/uL    Hemoglobin 7.4 (*) 12.0 - 15.0 g/dL    HCT 47.8 (*) 29.5 - 46.0 %    MCV 83.0  78.0 - 100.0 fL    MCH 26.7  26.0 - 34.0 pg    MCHC 32.2  30.0 - 36.0 g/dL    RDW 62.1  30.8 - 65.7 %    Platelets 350  150 - 400 K/uL   BASIC METABOLIC PANEL     Status: Abnormal   Collection Time   09/07/12  5:45 AM      Component Value Range Comment   Sodium 137  135 - 145 mEq/L    Potassium 3.8  3.5 - 5.1 mEq/L    Chloride 99  96 - 112 mEq/L    CO2 32  19 - 32 mEq/L    Glucose, Bld 135 (*) 70 - 99 mg/dL     BUN 6  6 -  23 mg/dL    Creatinine, Ser 4.09  0.50 - 1.10 mg/dL    Calcium 9.1  8.4 - 81.1 mg/dL    GFR calc non Af Amer >90  >90 mL/min    GFR calc Af Amer >90  >90 mL/min   GLUCOSE, CAPILLARY     Status: Abnormal   Collection Time   09/07/12  6:39 AM      Component Value Range Comment   Glucose-Capillary 122 (*) 70 - 99 mg/dL   PREPARE RBC (CROSSMATCH)     Status: Normal   Collection Time   09/07/12  7:30 AM      Component Value Range Comment   Order Confirmation ORDER PROCESSED BY BLOOD BANK     GLUCOSE, CAPILLARY     Status: Abnormal   Collection Time   09/07/12 11:07 AM      Component Value Range Comment   Glucose-Capillary 114 (*) 70 - 99 mg/dL    Comment 1 Notify RN     GLUCOSE, CAPILLARY     Status: Normal   Collection Time   09/07/12  4:21 PM      Component Value Range Comment   Glucose-Capillary 94  70 - 99 mg/dL   VANCOMYCIN, TROUGH     Status: Normal   Collection Time   09/07/12 10:15 PM      Component Value Range Comment   Vancomycin Tr 19.9  10.0 - 20.0 ug/mL   GLUCOSE, CAPILLARY     Status: Abnormal   Collection Time   09/07/12 10:30 PM      Component Value Range Comment   Glucose-Capillary 113 (*) 70 - 99 mg/dL   CBC     Status: Abnormal   Collection Time   09/08/12  5:00 AM      Component Value Range Comment   WBC 6.2  4.0 - 10.5 K/uL    RBC 2.92 (*) 3.87 - 5.11 MIL/uL    Hemoglobin 7.7 (*) 12.0 - 15.0 g/dL    HCT 91.4 (*) 78.2 - 46.0 %    MCV 82.2  78.0 - 100.0 fL    MCH 26.4  26.0 - 34.0 pg    MCHC 32.1  30.0 - 36.0 g/dL    RDW 95.6  21.3 - 08.6 %    Platelets 358  150 - 400 K/uL   GLUCOSE, CAPILLARY     Status: Abnormal   Collection Time   09/08/12  6:37 AM      Component Value Range Comment   Glucose-Capillary 122 (*) 70 - 99 mg/dL   GLUCOSE, CAPILLARY     Status: Normal   Collection Time   09/08/12 11:27 AM      Component Value Range Comment   Glucose-Capillary 91  70 - 99 mg/dL    Comment 1 Notify RN       MICRO: Right thigh  aspirate 09/04/12: MRSA Right thigh I&D 09/07/12: Staph Aureus - sensitivities pending  Blood 09/05/12: Negative  IMAGING: 09/04/12 12:46PM  Fluoro-guided aspiration of right knee: Findings: The knee was localized under fluoroscopy. A 20 gauge  spinal needle was then advanced into the joint. Position was  confirmed by injecting contrast material. A single drop of normal  appearing joint fluid was aspirated. 7 ml of saline were then  injected into the joint and subsequent ratio was attempted but  failed. The patient tolerated the procedure well. There was no  immediate post procedural complication. Specimen obtained was sent  to the laboratory.  09/04/12  14:48 US aspiration of right thigh: IMPRESSION:  1. Technically successful ultrasound guided aspiration of a  complex fluid collection adjacent to the right femur.  Approximately 35 ml of purulent sanguinous fluid was aspirated.  2. The fluid was sent for cell count, Gram stain and culture.  09/03/12 MRI Right Femur: IMPRESSION:  1. MR findings most consistent with septic arthritis with  dissecting abscess up along the anterior and lateral aspect of the  femur with severe surrounding myositis extending all way up to the  hip.  2. No findings to suggest osteomyelitis.    HISTORICAL MICRO/IMAGING  Assessment/Plan:  Amy Barnes is a 61 y.o. female with a past medical history of HTN, DM type 2, hyperlipidemia, obesity, hypothyroidism, bipolar disorder, and multiple orthopedic surgeries who now presents s/p meniscotomy on 09/01/12 and I&D of right thigh abscess on 09/06/12.  1) Right thigh abscess with MRSA:  Patient is being treated appropriately with vancomycin.  Patient will need 6 weeks total of treatment.  2) Possible septic right knee: Knee was tapped on 09/04/12 and the fluid was sent to the laboratory. This fluid culture cannot be found in the chart.  The abscess fluid from the same day is actually labeled as synovial fluid in the  chart.  Therefore, we are unsure if the joint is infected or not.  Recommend re-tapping the right knee an sending synovial fluid for culture and cell count.

## 2012-09-08 NOTE — Clinical Social Work Placement (Addendum)
Clinical Social Work Department  CLINICAL SOCIAL WORK PLACEMENT NOTE  09/08/2012  Patient: Amy Barnes  Account Number: [id number] Admit date: 09/01/12 Clinical Social Worker: Sabino Niemann MSW Date/time: 09/08/2012 1:30 AM  Clinical Social Work is seeking post-discharge placement for this patient at the following level of care: SKILLED NURSING (*CSW will update this form in Epic as items are completed)  09/08/2012 Patient/family provided with [facility] Department of Clinical Social Work's list of facilities offering this level of care within the geographic area requested by the patient (or if unable, by the patient's family).  09/08/2012 Patient/family informed of their freedom to choose among providers that offer the needed level of care, that participate in Medicare, Medicaid or managed care program needed by the patient, have an available bed and are willing to accept the patient.  09/08/2012 Patient/family informed of [facility]' ownership interest in [facility], as well as of the fact that they are under no obligation to receive care at this facility.  PASARR submitted to EDS on  PASARR number received from EDS on  FL2 transmitted to all facilities in geographic area requested by pt/family on 09/08/2012  FL2 transmitted to all facilities within larger geographic area on  Patient informed that his/her managed care company has contracts with or will negotiate with certain facilities, including the following:  Patient/family informed of bed offers received:  Patient chooses bed at  Physician recommends and patient chooses bed at  Patient to be transferred to on  Patient to be transferred to facility by  The following physician request were entered in Epic:  Additional Comments:  Sabino Niemann, MSW  [phone]

## 2012-09-08 NOTE — Progress Notes (Signed)
Had a discussion with patient, family, susan brady and physical therapy.  I think patient needs a skilled nursing facility for further rehab, pain control and iv antibiotics.  Husband seems receptive.  Patient is weight bearing as tolerated and dressing was changed today.  Drain pulled.  Her remaining stitches should be taken out at follow up with Dr. Sherlean Foot on 09/16/12.  Call for appointment.  [phone]. Continue asa for dvt prophaxis

## 2012-09-08 NOTE — Progress Notes (Signed)
SPORTS MEDICINE AND JOINT REPLACEMENT  Georgena Spurling, MD   Altamese Cabal, PA-C [address] [place], [place], [place]  [address]                             [phone]   PROGRESS NOTE  Subjective:  negative for Chest Pain  negative for Shortness of Breath  negative for Nausea/Vomiting   negative for Calf Pain  negative for Bowel Movement   Tolerating Diet: yes         Patient reports pain as 7 on 0-10 scale.    Objective: Vital signs in last 24 hours:   Patient Vitals for the past 24 hrs:  BP Temp Temp src Pulse Resp SpO2  09/08/12 0635 111/55 mmHg 98.2 F (36.8 C) - 82  18  99 %  09/08/12 0400 - - - - 18  99 %  09/08/12 0000 - - - - 18  99 %  09/07/12 2229 107/48 mmHg 99.2 F (37.3 C) Oral 88  - 99 %  09/07/12 2000 - - - - 18  99 %  09/07/12 1430 111/49 mmHg 98.9 F (37.2 C) Oral 84  18  100 %  09/07/12 1400 120/53 mmHg 99.1 F (37.3 C) Oral 83  16  100 %    @flow {1959:LAST@   Intake/Output from previous day:   12/10 0701 - 12/11 0700 In: 2068 [P.O.:1200; I.V.:500] Out: 1000 [Urine:1000]   Intake/Output this shift:   12/11 0701 - 12/11 1900 In: 240 [P.O.:240] Out: -    Intake/Output      12/10 0701 - 12/11 0700 12/11 0701 - 12/12 0700   P.O. 1200 240   I.V. (mL/kg) 500 (3.3)    Blood 360    Other 8    IV Piggyback     Total Intake(mL/kg) 2068 (13.6) 240 (1.6)   Urine (mL/kg/hr) 1000 (0.3)    Blood     Total Output 1000    Net +1068 +240        Urine Occurrence 7 x       LABORATORY DATA:  Basename 09/08/12 0500 09/07/12 0545 09/06/12 0605 09/05/12 0525 09/02/12 0645  WBC 6.2 6.6 6.4 6.8 7.7  HGB 7.7* 7.4* 8.0* 8.1* 9.0*  HCT 24.0* 23.0* 24.8* 25.0* 27.4*  PLT 358 350 391 401* 395    Basename 09/07/12 0545 09/05/12 0525 09/03/12 1015 09/02/12 0910  NA 137 137 135 128*  K 3.8 3.8 4.2 4.3  CL 99 101 95* 92*  CO2 32 29 29 29   BUN 6 8 7 10   CREATININE 0.50 0.52 0.57 0.70  GLUCOSE 135* 149* 115* 138*  CALCIUM 9.1 8.9 9.6 8.5   Lab Results   Component Value Date   INR 1.18 08/31/2012   INR 1.23 08/29/2012    Examination:  General appearance: alert, cooperative and no distress Extremities: Homans sign is negative, no sign of DVT  Wound Exam: clean, dry, intact   Drainage:  None: wound tissue dry  Motor Exam: EHL and FHL Intact  Sensory Exam: Deep Peroneal normal  Vascular Exam:    Assessment:    2 Days Post-Op  Procedure(s) (LRB): IRRIGATION AND DEBRIDEMENT EXTREMITY (Right)  ADDITIONAL DIAGNOSIS:  Principal Problem:  *Right anterior knee pain Active Problems:  HYPOTHYROIDISM  DM type 2 with diabetic peripheral neuropathy  ANEMIA-NOS  BIPOLAR AFFECTIVE DISORDER, DEPRESSED  HYPERTENSION  ASTHMA  GERD  Obesity, morbid (more than 100 lbs over ideal weight  or BMI > 40)  Acute Blood Loss Anemia   Plan: Physical Therapy as ordered Weight Bearing as Tolerated (WBAT)  DVT Prophylaxis:  Aspirin  DISCHARGE PLAN: Skilled Nursing Facility/Rehab  DISCHARGE NEEDS: HHPT, Walker and 3-in-1 comode seat         Garrick Midgley 09/08/2012, 1:35 PM

## 2012-09-08 NOTE — Consult Note (Addendum)
INFECTIOUS DISEASE ATTENDING ADDENDUM:     [facility]   Date: 09/08/2012  Patient name: Amy Barnes  Medical record number: [id number]  Date of birth: 11-Aug-1951    This patient has been seen and discussed with the house staff. Please see their note for complete details. I concur with their findings with the following additions/corrections:  Patient with VERY large MRSA abscess in thigh I and D by Dr. Carola Frost. This was highly likely the origin of her original pain ssx.   I DO REMAIN CONCERNED ABOUT HER KNEE JOINT AS WELL THOUGH  KNEE WAS ASPIRATED BY IR BUT FLUID APPARENTLY WAS NEVER ANALYZED FOR CELL COUNT AND DIFFERENTIAL  THE MATERIAL LABELLED AS SYNOVIAL FLUID IN EPIC APPEARS BASED ON TIME STAMPS TO INSTEAD BY FLUID FROM THE ABSCESS OF THIGH MUSCLE  THIS PATIENT SHOULD HAVE REPEAT ASPIRATE SENT FROM KNEE FOR CELL COUNT AND DIFFERENTIAL AT MINIMUM  IF WBC IN ACTUAL SYNOVIAL FLUID ARE ELEVATED (WHICH IS WHAT IS APPARENTLY MISTAKENLY ENTERED IN EPIC) THEN PT NEEDS RETURN TRIP TO THE OR FOR I AND D OF HER KNEE     Acey Lav 09/08/2012, 4:43 PM

## 2012-09-08 NOTE — Progress Notes (Signed)
Occupational Therapy Treatment Patient Details Name: Amy Barnes MRN: [id number] DOB: September 17, 1951 Today's Date: 09/08/2012 Time: 1100-1135 OT Time Calculation (min): 35 min  OT Assessment / Plan / Recommendation Comments on Treatment Session Pt in pain today but was able to participate and mobilize in the room with min guard to min assist at times.      Follow Up Recommendations  No OT follow up    Barriers to Discharge       Equipment Recommendations       Recommendations for Other Services    Frequency Min 2X/week   Plan Discharge plan remains appropriate    Precautions / Restrictions Precautions Precautions: Fall Restrictions Weight Bearing Restrictions: Yes RLE Weight Bearing: Weight bearing as tolerated   Pertinent Vitals/Pain Pt with 9/10 pain.  Nursing aware.  Pt repositioned.    ADL  Eating/Feeding: Simulated;Independent Where Assessed - Eating/Feeding: Chair Grooming: Performed;Wash/dry hands;Wash/dry face;Brushing hair;Set up Where Assessed - Grooming: Supported sitting Upper Body Bathing: Performed;Set up Where Assessed - Upper Body Bathing: Supported sitting Lower Body Bathing: Performed;Moderate assistance Where Assessed - Lower Body Bathing: Supported sit to stand Toilet Transfer: Performed;Minimal Dentist Method: Surveyor, minerals: Bedside commode Equipment Used: Rolling walker Transfers/Ambulation Related to ADLs: Pt overall min assist for transfer to chair.  Pt walked appx 15 feet with min guard. ADL Comments: Pt continues to have pain but now able to get up and move some for adls.  Pt already knowledgable about use of all equipment.  Husband is quick to assist pt even when she can do for herself.  Talked to pt at length about doing her own self care to attempt to build up her activity tolerance.      OT Diagnosis:    OT Problem List:   OT Treatment Interventions:     OT Goals Acute Rehab OT Goals OT Goal  Formulation: With patient/family Time For Goal Achievement: 09/17/12 Potential to Achieve Goals: Good ADL Goals Pt Will Perform Grooming: with supervision;Supported;Standing at sink;Other (comment) ADL Goal: Grooming - Progress: Progressing toward goals Pt Will Perform Lower Body Dressing: with supervision;with adaptive equipment;Sit to stand from bed ADL Goal: Lower Body Dressing - Progress: Progressing toward goals Pt Will Transfer to Toilet: with supervision;with DME;Extra wide 3-in-1 ADL Goal: Toilet Transfer - Progress: Progressing toward goals Miscellaneous OT Goals Miscellaneous OT Goal #1: Pt will perform all selfcare/transfers with pain less than or equal to 5/10. OT Goal: Miscellaneous Goal #1 - Progress: Progressing toward goals  Visit Information  Last OT Received On: 09/08/12 Assistance Needed: +1 PT/OT Co-Evaluation/Treatment: Yes    Subjective Data      Prior Functioning       Cognition  Overall Cognitive Status: Appears within functional limits for tasks assessed/performed Arousal/Alertness: Awake/alert Orientation Level: Oriented X4 / Intact Behavior During Session: [facility] for tasks performed    Mobility  Shoulder Instructions Bed Mobility Bed Mobility: Supine to Sit;Sitting - Scoot to Edge of Bed Supine to Sit: 4: Min assist;With rails Sitting - Scoot to [place] of Bed: 4: Min assist;Other (comment) (assist to hold R leg) Details for Bed Mobility Assistance: Pt needed more assist today to move LLE due to pain. Transfers Transfers: Sit to Stand;Stand to Sit Sit to Stand: 4: Min guard;With upper extremity assist;From bed Stand to Sit: 4: Min guard;With armrests;To elevated surface;To chair/3-in-1       Exercises      Balance Balance Balance Assessed: No   End of Session OT -  End of Session Activity Tolerance: Patient limited by pain Patient left: in chair;with call bell/phone within reach;with family/visitor present  GO     Amy Barnes  [phone] 09/08/2012, 11:52 AM

## 2012-09-08 NOTE — Progress Notes (Signed)
ANTIBIOTIC CONSULT NOTE - FOLLOW UP  Pharmacy Consult for vancomycin Indication: Leg abscess  Labs:  [facility] 09/07/12 0545 09/06/12 0605 09/05/12 0525  WBC 6.6 6.4 6.8  HGB 7.4* 8.0* 8.1*  PLT 350 391 401*  LABCREA -- -- --  CREATININE 0.50 -- 0.52   Estimated Creatinine Clearance: 113.9 ml/min (by C-G formula based on Cr of 0.5).  Basename 09/07/12 2215 09/05/12 2105  VANCOTROUGH 19.9 10.5  VANCOPEAK -- --  VANCORANDOM -- --  GENTTROUGH -- --  GENTPEAK -- --  GENTRANDOM -- --  TOBRATROUGH -- --  TOBRAPEAK -- --  TOBRARND -- --  AMIKACINPEAK -- --  AMIKACINTROU -- --  AMIKACIN -- --     Microbiology: Recent Results (from the past 720 hour(s))  SURGICAL PCR SCREEN     Status: Abnormal   Collection Time   08/29/12  5:43 AM      Component Value Range Status Comment   MRSA, PCR POSITIVE (*) NEGATIVE Final    Staphylococcus aureus POSITIVE (*) NEGATIVE Final   URINE CULTURE     Status: Normal   Collection Time   08/30/12  3:11 PM      Component Value Range Status Comment   Specimen Description URINE, CATHETERIZED   Final    Special Requests NONE   Final    Culture  Setup Time 08/30/2012 15:34   Final    Colony Count >=100,000 COLONIES/ML   Final    Culture KLEBSIELLA PNEUMONIAE   Final    Report Status 09/01/2012 FINAL   Final    Organism ID, Bacteria KLEBSIELLA PNEUMONIAE   Final   BODY FLUID CULTURE     Status: Normal   Collection Time   09/04/12  2:30 PM      Component Value Range Status Comment   Specimen Description SYNOVIAL RIGHT KNEE   Final    Special Requests NONE   Final    Gram Stain     Final    Value: WBC PRESENT,BOTH PMN AND MONONUCLEAR     NO ORGANISMS SEEN   Culture     Final    Value: MODERATE METHICILLIN RESISTANT STAPHYLOCOCCUS AUREUS     Note: RIFAMPIN AND GENTAMICIN SHOULD NOT BE USED AS SINGLE DRUGS FOR TREATMENT OF STAPH INFECTIONS. CRITICAL RESULT CALLED TO, READ BACK BY AND VERIFIED WITH: CAROL H @ 1128 09/06/12 BY KRAWS CRITICAL RESULT  CALLED TO, READ BACK BY AND VERIFIED WITH:      CAROL H @ 1302 09/06/12 BY KRAWS   Report Status 09/07/2012 FINAL   Final    Organism ID, Bacteria METHICILLIN RESISTANT STAPHYLOCOCCUS AUREUS   Final   CULTURE, BLOOD (ROUTINE X 2)     Status: Normal (Preliminary result)   Collection Time   09/05/12  4:51 PM      Component Value Range Status Comment   Specimen Description BLOOD LEFT ARM   Final    Special Requests BOTTLES DRAWN AEROBIC AND ANAEROBIC 10CC   Final    Culture  Setup Time 09/05/2012 20:57   Final    Culture     Final    Value:        BLOOD CULTURE RECEIVED NO GROWTH TO DATE CULTURE WILL BE HELD FOR 5 DAYS BEFORE ISSUING A FINAL NEGATIVE REPORT   Report Status PENDING   Incomplete   CULTURE, BLOOD (ROUTINE X 2)     Status: Normal (Preliminary result)   Collection Time   09/05/12  4:56 PM  Component Value Range Status Comment   Specimen Description BLOOD LEFT HAND   Final    Special Requests     Final    Value: BOTTLES DRAWN AEROBIC AND ANAEROBIC 10CC AER,8CC ANA   Culture  Setup Time 09/05/2012 20:57   Final    Culture     Final    Value:        BLOOD CULTURE RECEIVED NO GROWTH TO DATE CULTURE WILL BE HELD FOR 5 DAYS BEFORE ISSUING A FINAL NEGATIVE REPORT   Report Status PENDING   Incomplete   ANAEROBIC CULTURE     Status: Normal (Preliminary result)   Collection Time   09/06/12 12:00 PM      Component Value Range Status Comment   Specimen Description ABSCESS RIGHT THIGH   Final    Special Requests PATIENT ON FOLLOWING VANCOMYCIN   Final    Gram Stain PENDING   Incomplete    Culture     Final    Value: NO ANAEROBES ISOLATED; CULTURE IN PROGRESS FOR 5 DAYS   Report Status PENDING   Incomplete   CULTURE, ROUTINE-ABSCESS     Status: Normal (Preliminary result)   Collection Time   09/06/12 12:00 PM      Component Value Range Status Comment   Specimen Description ABSCESS RIGHT THIGH   Final    Special Requests PATIENT ON FOLLOWING VANCOMYCIN   Final    Gram Stain      Final    Value: RARE WBC PRESENT, PREDOMINANTLY PMN     RARE SQUAMOUS EPITHELIAL CELLS PRESENT     NO ORGANISMS SEEN   Culture NO GROWTH 1 DAY   Final    Report Status PENDING   Incomplete   FUNGUS CULTURE W SMEAR     Status: Normal (Preliminary result)   Collection Time   09/06/12 12:00 PM      Component Value Range Status Comment   Specimen Description ABSCESS RIGHT THIGH   Final    Special Requests PATIENT ON FOLLOWING VANCOMYCIN   Final    Fungal Smear NO YEAST OR FUNGAL ELEMENTS SEEN   Final    Culture CULTURE IN PROGRESS FOR FOUR WEEKS   Final    Report Status PENDING   Incomplete   GRAM STAIN     Status: Normal   Collection Time   09/06/12 12:00 PM      Component Value Range Status Comment   Specimen Description ABSCESS RIGHT THIGH   Final    Special Requests PATIENT ON FOLLOWING VANCOMYCIN   Final    Gram Stain     Final    Value: RARE WBC PRESENT,BOTH PMN AND MONONUCLEAR     NO ORGANISMS SEEN     CALLED TO DR HANDY 09/06/12 1525 BY K SCHULTZ   Report Status 09/06/2012 FINAL   Final     Anti-infectives     Start     Dose/Rate Route Frequency Ordered Stop   09/07/12 1030   fluconazole (DIFLUCAN) tablet 100 mg        100 mg Oral Daily 09/07/12 0919 09/09/12 0959   09/06/12 1030   fluconazole (DIFLUCAN) IVPB 100 mg  Status:  Discontinued        100 mg 50 mL/hr over 60 Minutes Intravenous Every 24 hours 09/06/12 1017 09/07/12 0919   09/06/12 0600   vancomycin (VANCOCIN) 1,500 mg in sodium chloride 0.9 % 500 mL IVPB        1,500 mg 250 mL/hr over 120  Minutes Intravenous Every 8 hours 09/05/12 2313     09/04/12 1200   imipenem-cilastatin (PRIMAXIN) 500 mg in sodium chloride 0.9 % 100 mL IVPB        500 mg 200 mL/hr over 30 Minutes Intravenous 4 times per day 09/04/12 0923     09/04/12 1000   vancomycin (VANCOCIN) 1,500 mg in sodium chloride 0.9 % 500 mL IVPB  Status:  Discontinued        1,500 mg 250 mL/hr over 120 Minutes Intravenous Every 12 hours 09/04/12 0923  09/05/12 2312   09/02/12 0000   vancomycin (VANCOCIN) IVPB 1000 mg/200 mL premix        1,000 mg 200 mL/hr over 60 Minutes Intravenous Every 12 hours 09/01/12 1642 09/02/12 0102   09/01/12 1700   levofloxacin (LEVAQUIN) tablet 500 mg        500 mg Oral Every 24 hr x 2 09/01/12 0915 09/02/12 1634   09/01/12 1230   vancomycin (VANCOCIN) 1,000 mg in sodium chloride 0.9 % 250 mL IVPB  Status:  Discontinued        1,000 mg 250 mL/hr over 60 Minutes Intravenous  Once 09/01/12 1219 09/01/12 1226   09/01/12 1230   vancomycin (VANCOCIN) 2,000 mg in sodium chloride 0.9 % 500 mL IVPB        2,000 mg 250 mL/hr over 120 Minutes Intravenous To Surgery 09/01/12 1226 09/01/12 1208   08/30/12 1700   levofloxacin (LEVAQUIN) IVPB 500 mg  Status:  Discontinued        500 mg 100 mL/hr over 60 Minutes Intravenous Every 24 hours 08/30/12 1649 09/01/12 0915         Assessment: 61 yo female on D5 vancomycin for leg abscess associated with myositis s/p I&D. Vancomycin trough is at-goal on 1500 mg IV Q8H.   Goal of Therapy:  Vancomycin trough 10-20 mcg/mL   Plan:  1. Continue IV vancomycin at 1500mg  IV Q8H.   Amy Barnes  09/08/2012,12:30 AM

## 2012-09-09 LAB — GLUCOSE, CAPILLARY
Glucose-Capillary: 114 mg/dL — ABNORMAL HIGH (ref 70–99)
Glucose-Capillary: 79 mg/dL (ref 70–99)
Glucose-Capillary: 123 mg/dL — ABNORMAL HIGH (ref 70–99)
Glucose-Capillary: 114 mg/dL — ABNORMAL HIGH (ref 70–99)

## 2012-09-09 LAB — CULTURE, ROUTINE-ABSCESS

## 2012-09-09 LAB — BASIC METABOLIC PANEL
BUN: 6 mg/dL (ref 6–23)
CO2: 31 mEq/L (ref 19–32)
Calcium: 8.9 mg/dL (ref 8.4–10.5)
Chloride: 101 mEq/L (ref 96–112)
Creatinine, Ser: 0.5 mg/dL (ref 0.50–1.10)
GFR calc Af Amer: >90 mL/min (ref >90)
GFR calc non Af Amer: >90 mL/min (ref >90)
Glucose, Bld: 123 mg/dL — ABNORMAL HIGH (ref 70–99)
Potassium: 3.6 mEq/L (ref 3.5–5.1)
Sodium: 140 mEq/L (ref 135–145)

## 2012-09-09 LAB — CBC
HCT: 25.6 % — ABNORMAL LOW (ref 36.0–46.0)
Hemoglobin: 8 g/dL — ABNORMAL LOW (ref 12.0–15.0)
MCH: 26.2 pg (ref 26.0–34.0)
MCHC: 31.3 g/dL (ref 30.0–36.0)
MCV: 83.9 fL (ref 78.0–100.0)
Platelets: 366 10*3/uL (ref 150–400)
RBC: 3.05 MIL/uL — ABNORMAL LOW (ref 3.87–5.11)
RDW: 14.7 % (ref 11.5–15.5)
WBC: 5.9 10*3/uL (ref 4.0–10.5)

## 2012-09-09 MED ORDER — BISACODYL 5 MG PO TBEC
10.0000 mg | DELAYED_RELEASE_TABLET | Freq: Every day | ORAL | Status: DC | PRN
  Administered 2012-09-09: 10 mg via ORAL
  Filled 2012-09-09 (×2): qty 2

## 2012-09-09 MED ORDER — OXYCODONE HCL 5 MG PO TABS
10.0000 mg | ORAL_TABLET | ORAL | Status: DC | PRN
  Administered 2012-09-09 – 2012-09-11 (×12): 10 mg via ORAL
  Filled 2012-09-09 (×12): qty 2

## 2012-09-09 MED ORDER — SODIUM CHLORIDE 0.9 % IJ SOLN
10.0000 mL | INTRAMUSCULAR | Status: DC | PRN
  Administered 2012-09-11: 10 mL

## 2012-09-09 NOTE — Progress Notes (Addendum)
[facility] PROGRESS NOTE  Amy Barnes QMV:[id number] DOB: [birth year] DOA: 08/28/2012 PCP: [name], MD  Assessment/Plan: Principal Problem:  *Right anterior knee pain Active Problems:  HYPOTHYROIDISM  DM type 2 with diabetic peripheral neuropathy  ANEMIA-NOS  BIPOLAR AFFECTIVE DISORDER, DEPRESSED  HYPERTENSION  ASTHMA  GERD  Obesity, morbid (more than 100 lbs over ideal weight or BMI > 40)    HPI :*  61 year old Amy Barnes female presented with knee pain . . . Patient admitted for pain control. She was found to have acute right knee pain due to medial and lateral meniscal tear, she is unable to stand up or bear weight on it, orthopedics arthroscopic surgical intervention on 09/01/2012. She had Right knee arthroscopy with partial medial and lateral meniscectomy.  She had seen Dr. Lovell Sheehan in the office last week. Dr. Lovell Sheehan thought the pain was more likely coming from her knee but he ordered a lumbar MRI just to make sure that there was a significant lumbar spine pathology contributing to her pain. She had not gotten the MRI scan yet and was admitted for pain control. The patient's lumbar spine MRI showed results as above. Details hospital course as follows    HOSPITAL COURSE:  1. Right knee pain, status post, Right knee arthroscopy with partial medial and  lateral meniscectomy. unable to bear weight on the right leg, in a patient with morbid obesity and MRI evidence of both lateral and medial meniscal tear along with joint effusion - orthopedics and neurosurgery were consulted, She has been seen by Dr. Valentina Gu on 08-2012 who performed surgery on 09/01/2012 of note patient is on Lovenox for DVT prophylaxis however it is weight-based, she will continue with this for another 2 weeks. Also seen by Dr. Lovell Sheehan neurosurgeon as she has history of L-spine problem, L-spine MRI and Dr. Lovell Sheehan evaluation not consistent with any ongoing L-spine issues.  Per Dr. Valentina Gu recommendations the patient  had an MRI discussed results with radiology the patient has a posterior, leg abscess associated with myositis. I have already called Dr. Sherlean Foot multiple times and discussed the findings of the MRI. Dr. Sherlean Foot , she status post ultrasound-guided aspiration 35 cc of purulent material was removed on12/03/2012  , she has been started on empiric antibiotics vancomycin and imipenem. Status post IRRIGATION AND DEBRIDEMENT EXTREMITY (Right) - I&D of right thigh on 09/07/2012 . Infectious disease has been consulted for antibiotic management. We'll discontinue central line and place a PICC line. Physical therapy recommended home health physical therapy as well as a rolling walker and bedside commode, however the patient will need at least 4-6 weeks of IV antibiotics because of septic arthritis of the knee. She may need to go to SNF.  Based on infectious disease recommendations I called Dr.Lucy , and notified him that the patient needs to have her knee joint washed out because of the positive MRSA. I placed the call on 12/11 at 1:37 PM   Patient with VERY large MRSA abscess in thigh I and D by Dr. Carola Frost. This was highly likely the origin of her original pain ssx.  Infectious disease REMAINs CONCERNED ABOUT HER KNEE JOINT AS WELL THOUGH  KNEE WAS ASPIRATED BY IR BUT FLUID APPARENTLY WAS NEVER ANALYZED FOR CELL COUNT AND DIFFERENTIAL  THE MATERIAL LABELLED AS SYNOVIAL FLUID IN EPIC APPEARS BASED ON TIME STAMPS TO INSTEAD BY FLUID FROM THE ABSCESS OF THIGH MUSCLE  THIS PATIENT SHOULD HAVE REPEAT ASPIRATE SENT FROM KNEE FOR CELL COUNT AND DIFFERENTIAL AT MINIMUM  IF  WBC IN ACTUAL SYNOVIAL FLUID ARE ELEVATED (WHICH IS WHAT IS APPARENTLY MISTAKENLY ENTERED IN EPIC) THEN PT NEEDS RETURN TRIP TO THE OR FOR I AND D OF HER KNEE This was conveyed to Dr. Valentina Gu Dr. Valentina Gu states that no further aspiration or surgeries indicated Will request Dr. Algis Liming to speak to Dr. Sherlean Foot directly Knee aspiration today    2. Diabetes mellitus  type 2 morbid obesity - during this hospitalization the patient was treated with pre-meal NovoLog along with sliding scale insulin, she will resume her home medications  3. History of hypertension. ARB discontinued because of marginally low blood pressure, monitor blood pressure.  4. Hypothyroidism. Continue home dose Synthroid.  5. Dyslipidemia continue home dose statin.  6. Morbid obesity. Outpatient follow with PCP and dietician.  7.anemia hemoglobin increased from 7.4 to 8.0 after one unit of packed red blood cells, holding Lovenox for DVT prophylaxis , hemoglobin has been trending down in the outpatient setting. Patient would benefit from a full GI workup to be arranged for by the primary care provider.  8. Pain control patient on multiple narcotic medications, patient has been advised not to drive for at least a month  9. hyponatremia resolved, patient has been placed on fluid restriction and a dose of Lasix has been decreased she would need a repeat BMP in one week. Sodium improved to 137, from 128.  10.constipation will start aggressive constipation protocol 11.       HPI/Subjective:  Stable overnight pain is improved  Objective:   Objective: Filed Vitals:   09/08/12 0635 09/08/12 2209 09/09/12 0506 09/09/12 0800  BP: 111/55 145/55 147/70   Pulse: 82 82 85   Temp: 98.2 F (36.8 C) 98.4 F (36.9 C) 98.6 F (37 C)   TempSrc:      Resp: 18 18 18 16   Height:      Weight:      SpO2: 99% 95% 96% 96%    Intake/Output Summary (Last 24 hours) at 09/09/12 1054 Last data filed at 09/09/12 0700  Gross per 24 hour  Intake    970 ml  Output      0 ml  Net    970 ml    Exam:  HENT:  Head: Atraumatic.  Nose: Nose normal.  Mouth/Throat: Oropharynx is clear and moist.  Eyes: Conjunctivae are normal. Pupils are equal, round, and reactive to light. No scleral icterus.  Neck: Neck supple. No tracheal deviation present.  Cardiovascular: Normal rate, regular rhythm, normal heart  sounds and intact distal pulses.  Pulmonary/Chest: Effort normal and breath sounds normal. No respiratory distress.  Abdominal: Soft. Normal appearance and bowel sounds are normal. She exhibits no distension. There is no tenderness.  Musculoskeletal: She exhibits no edema and no tenderness.  Neurological: She is alert. No cranial nerve deficit.    Data Reviewed: Basic Metabolic Panel:  Lab 09/09/12 1610 09/07/12 0545 09/05/12 0525 09/03/12 1015  NA 140 137 137 135  K 3.6 3.8 3.8 4.2  CL 101 99 101 95*  CO2 31 32 29 29  GLUCOSE 123* 135* 149* 115*  BUN 6 6 8 7   CREATININE 0.50 0.50 0.52 0.57  CALCIUM 8.9 9.1 8.9 9.6  MG -- -- -- --  PHOS -- -- -- --    Liver Function Tests:  Lab 09/05/12 0525  AST 17  ALT 16  ALKPHOS 110  BILITOT 0.2*  PROT 6.3  ALBUMIN 2.2*   No results found for this basename: LIPASE:5,AMYLASE:5 in the last 168  hours No results found for this basename: AMMONIA:5 in the last 168 hours  CBC:  Lab 09/09/12 0537 09/08/12 0500 09/07/12 0545 09/06/12 0605 09/05/12 0525  WBC 5.9 6.2 6.6 6.4 6.8  NEUTROABS -- -- -- -- --  HGB 8.0* 7.7* 7.4* 8.0* 8.1*  HCT 25.6* 24.0* 23.0* 24.8* 25.0*  MCV 83.9 82.2 83.0 82.1 82.8  PLT 366 358 350 391 401*    Cardiac Enzymes:  Lab 09/04/12 1000  CKTOTAL 18  CKMB --  CKMBINDEX --  TROPONINI --   BNP (last 3 results) No results found for this basename: PROBNP:3 in the last 8760 hours   CBG:  Lab 09/09/12 0701 09/08/12 2211 09/08/12 1700 09/08/12 1127 09/08/12 0637  GLUCAP 114* 115* 130* 91 122*    Recent Results (from the past 240 hour(s))  URINE CULTURE     Status: Normal   Collection Time   08/30/12  3:11 PM      Component Value Range Status Comment   Specimen Description URINE, CATHETERIZED   Final    Special Requests NONE   Final    Culture  Setup Time 08/30/2012 15:34   Final    Colony Count >=100,000 COLONIES/ML   Final    Culture KLEBSIELLA PNEUMONIAE   Final    Report Status 09/01/2012 FINAL    Final    Organism ID, Bacteria KLEBSIELLA PNEUMONIAE   Final   BODY FLUID CULTURE     Status: Normal   Collection Time   09/04/12  2:30 PM      Component Value Range Status Comment   Specimen Description SYNOVIAL RIGHT KNEE   Final    Special Requests NONE   Final    Gram Stain     Final    Value: WBC PRESENT,BOTH PMN AND MONONUCLEAR     NO ORGANISMS SEEN   Culture     Final    Value: MODERATE METHICILLIN RESISTANT STAPHYLOCOCCUS AUREUS     Note: RIFAMPIN AND GENTAMICIN SHOULD NOT BE USED AS SINGLE DRUGS FOR TREATMENT OF STAPH INFECTIONS. CRITICAL RESULT CALLED TO, READ BACK BY AND VERIFIED WITH: CAROL H @ 1128 09/06/12 BY KRAWS CRITICAL RESULT CALLED TO, READ BACK BY AND VERIFIED WITH:      CAROL H @ 1302 09/06/12 BY KRAWS   Report Status 09/07/2012 FINAL   Final    Organism ID, Bacteria METHICILLIN RESISTANT STAPHYLOCOCCUS AUREUS   Final   CULTURE, BLOOD (ROUTINE X 2)     Status: Normal (Preliminary result)   Collection Time   09/05/12  4:51 PM      Component Value Range Status Comment   Specimen Description BLOOD LEFT ARM   Final    Special Requests BOTTLES DRAWN AEROBIC AND ANAEROBIC 10CC   Final    Culture  Setup Time 09/05/2012 20:57   Final    Culture     Final    Value:        BLOOD CULTURE RECEIVED NO GROWTH TO DATE CULTURE WILL BE HELD FOR 5 DAYS BEFORE ISSUING A FINAL NEGATIVE REPORT   Report Status PENDING   Incomplete   CULTURE, BLOOD (ROUTINE X 2)     Status: Normal (Preliminary result)   Collection Time   09/05/12  4:56 PM      Component Value Range Status Comment   Specimen Description BLOOD LEFT HAND   Final    Special Requests     Final    Value: BOTTLES DRAWN AEROBIC AND ANAEROBIC 10CC AER,8CC ANA  Culture  Setup Time 09/05/2012 20:57   Final    Culture     Final    Value:        BLOOD CULTURE RECEIVED NO GROWTH TO DATE CULTURE WILL BE HELD FOR 5 DAYS BEFORE ISSUING A FINAL NEGATIVE REPORT   Report Status PENDING   Incomplete   ANAEROBIC CULTURE     Status: Normal  (Preliminary result)   Collection Time   09/06/12 12:00 PM      Component Value Range Status Comment   Specimen Description ABSCESS RIGHT THIGH   Final    Special Requests PATIENT ON FOLLOWING VANCOMYCIN   Final    Gram Stain PENDING   Incomplete    Culture     Final    Value: NO ANAEROBES ISOLATED; CULTURE IN PROGRESS FOR 5 DAYS   Report Status PENDING   Incomplete   CULTURE, ROUTINE-ABSCESS     Status: Normal   Collection Time   09/06/12 12:00 PM      Component Value Range Status Comment   Specimen Description ABSCESS RIGHT THIGH   Final    Special Requests PATIENT ON FOLLOWING VANCOMYCIN   Final    Gram Stain     Final    Value: RARE WBC PRESENT, PREDOMINANTLY PMN     RARE SQUAMOUS EPITHELIAL CELLS PRESENT     NO ORGANISMS SEEN   Culture     Final    Value: FEW METHICILLIN RESISTANT STAPHYLOCOCCUS AUREUS     Note: RIFAMPIN AND GENTAMICIN SHOULD NOT BE USED AS SINGLE DRUGS FOR TREATMENT OF STAPH INFECTIONS. CRITICAL RESULT CALLED TO, READ BACK BY AND VERIFIED WITH: C.OBRAIN 12/12 @1000  BY REAMM   Report Status 09/09/2012 FINAL   Final    Organism ID, Bacteria METHICILLIN RESISTANT STAPHYLOCOCCUS AUREUS   Final   FUNGUS CULTURE W SMEAR     Status: Normal (Preliminary result)   Collection Time   09/06/12 12:00 PM      Component Value Range Status Comment   Specimen Description ABSCESS RIGHT THIGH   Final    Special Requests PATIENT ON FOLLOWING VANCOMYCIN   Final    Fungal Smear NO YEAST OR FUNGAL ELEMENTS SEEN   Final    Culture CULTURE IN PROGRESS FOR FOUR WEEKS   Final    Report Status PENDING   Incomplete   GRAM STAIN     Status: Normal   Collection Time   09/06/12 12:00 PM      Component Value Range Status Comment   Specimen Description ABSCESS RIGHT THIGH   Final    Special Requests PATIENT ON FOLLOWING VANCOMYCIN   Final    Gram Stain     Final    Value: RARE WBC PRESENT,BOTH PMN AND MONONUCLEAR     NO ORGANISMS SEEN     CALLED TO DR HANDY 09/06/12 1525 BY K SCHULTZ    Report Status 09/06/2012 FINAL   Final      Studies: Mr Lumbar Spine W Wo Contrast  08/31/2012  *RADIOLOGY REPORT*  Clinical Data: Severe right leg and knee pain.  Lumbar spine surgery 01/08/2012.  MRI LUMBAR SPINE WITHOUT AND WITH CONTRAST  Technique:  Multiplanar and multiecho pulse sequences of the lumbar spine were obtained without and with intravenous contrast.  Contrast: 20mL MULTIHANCE GADOBENATE DIMEGLUMINE 529 MG/ML IV SOLN  Comparison:  None available.  Findings:  Normal signal is present in the conus medullaris which terminates at L1-2.  Patient is status post L3-4 PLIF.  The marrow  signal, vertebral body heights, and alignment are otherwise normal. Limited imaging of the abdomen is unremarkable.  L1-2:  Negative.  L2-3:  Mild facet hypertrophy is present.  There is slight disc bulging.  No significant stenosis is present.  L3-4:  The patient is status post fusion.  Bilateral foraminotomies were performed.  The central canal and foramina are widely patent. The disc spacers are well positioned.  L4-5:  A mild broad-based disc herniation is present.  Facet hypertrophy is noted bilaterally.  This leads to mild distortion of the central canal without significant stenosis.  L5-S1:  A rightward disc herniation potentially contacts the exiting L5 nerve root within the foramen.  IMPRESSION:  1.  Status post L3-4 PLIF without evidence for residual or recurrent stenosis. 2.  Rightward disc herniation at L5-S1 potentially contacts the exiting right L5 nerve root. 3.  Slight disc bulging at to L2-3 and L4-5 without significant stenosis at either level.   Original Report Authenticated By: Marin Roberts, M.D.    Dg Pelvis Portable  09/01/2012  *RADIOLOGY REPORT*  Clinical Data: Right hip pain.  PORTABLE PELVIS  Comparison: None  Findings: There are mild right hip joint degenerative changes but no acute bony findings or plain film evidence of avascular necrosis.  Pubic symphysis and SI joints are intact.   A left hip prosthesis is noted along with lumbar fusion hardware.  IMPRESSION: Degenerative changes but no acute bony findings.   Original Report Authenticated By: Rudie Meyer, M.D.    Mr Femur Right W Wo Contrast  09/04/2012  *RADIOLOGY REPORT*  Clinical Data: Severe right leg pain.  Recent patellar surgery.  MRI RIGHT THIGH WITHOUT CONTRAST  Technique:  Multiplanar, multisequence MR imaging of the right thigh was performed.  No intravenous contrast was administered.  Comparison:   None  Findings:  There is a large complex the joint effusion with thick irregular synovial enhancement worrisome for septic arthritis.  No definite findings for osteomyelitis.  Extending out from a suprapatellar bursa and along the anterior and lateral aspect of the femur is an elongated abscess.  This extends approximately halfway up the femur measuring a total of approximately 20 cm in length.  There is severe surrounding myositis involving the thigh musculature extending all way up to the hip.  No MR findings to suggest osteomyelitis.  There is surrounding cellulitis.  IMPRESSION:  1.  MR findings most consistent with septic arthritis with dissecting abscess up along the anterior and lateral aspect of the femur with severe surrounding myositis extending all way up to the hip. 2.  No findings to suggest osteomyelitis.   Original Report Authenticated By: Rudie Meyer, M.D.    US Aspiration  09/04/2012  *RADIOLOGY REPORT*  Clinical Data: Deep tissue fluid collection about the right femur. Pain.  Fevers.  ULTRASOUND GUIDED ASPIRATION.  Comparison:  Right femur MRI 09/03/2012.  Sedation:  None  Procedure:  The procedure, risks, benefits, and alternatives were explained to the patient.  Questions regarding the procedure were encouraged and answered.  The patient understands and consents to the procedure.  The anterior right thigh was prepped with betadine in a sterile fashion, and a sterile drape was applied covering the operative  field.  A sterile gown and sterile gloves were used for the procedure. Local anesthesia was provided with 1% Lidocaine.  The fluid collection was identified anterior and lateral to the right femur.  Following local anesthesia, a 20 gauge needle was advanced into the fluid collection.  A 30 ml  syringe was filled with somewhat purulent and sanguinous material.  Using a 10 ml syringe I was able to obtain an additional 5 mm of fluid from a slightly more lateral aspect of the collection.  The needle was then removed.  Hemostasis was achieved.  Complications: None.  IMPRESSION:  1.  Technically successful ultrasound guided aspiration of a complex fluid collection adjacent to the right femur. Approximately 35 ml of purulent sanguinous fluid was aspirated. 2.  The fluid was sent for cell count, Gram stain and culture.   Original Report Authenticated By: Marin Roberts, M.D.    Dg Chest Port 1 View  09/01/2012  *RADIOLOGY REPORT*  Clinical Data: Status post central line placement.  PORTABLE CHEST - 1 VIEW  Comparison: Chest radiograph 08/30/2012  Findings: A right IJ central venous catheter is in place.  The distal tip projects over the distal superior vena cava.  Heart and mediastinal contours are stable.  Pulmonary vascular prominence appears similar to recent prior study.  Lung volumes are low.  No focal airspace disease or pleural effusion.  Postsurgical changes of the lower cervical spine.  Negative for pneumothorax.  No subcutaneous gas in the chest wall.  IMPRESSION:  Satisfactory position of right IJ central venous catheter.  No complicating feature.   Original Report Authenticated By: Britta Mccreedy, M.D.    Dg Chest Port 1 View  08/30/2012  *RADIOLOGY REPORT*  Clinical Data: Cough and weakness.  PORTABLE CHEST - 1 VIEW  Comparison: 01/08/2012 and 01/02/2012.  Findings: Central pulmonary vascular prominence without pulmonary edema.  No segmental infiltrate or gross pneumothorax.  Calcified slightly tortuous  aorta.  Heart size top normal.  Postsurgical changes lower cervical spine.  IMPRESSION: No acute abnormality.  If there is a persistent unexplained cough, follow-up two-view exam may be considered.   Original Report Authenticated By: Lacy Duverney, M.D.    Dg Knee Right Port  08/29/2012  *RADIOLOGY REPORT*  Clinical Data: Severe right knee pain.  PORTABLE RIGHT KNEE - 1-2 VIEW  Comparison: None.  Findings: There is marked narrowing of the medial compartment with small marginal osteophytes. Slight sclerosis of the medial compartment.  Lateral and patellofemoral compartments appear essentially normal.  No appreciable joint effusion.  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   Original Report Authenticated By: Francene Boyers, M.D.    Dg Fluoro Guide Ndl Plc/bx  09/04/2012  *RADIOLOGY REPORT*  Clinical Data:  Knee pain.  Possible septic joint.  ARTHROCENTESIS/INJECTION OF LARGE JOINT  Technique: There is some benefits of the procedure were discussed with the patient.  She was given an opportunity to ask questions. Written consent was then obtained.  Time-out was performed for the procedure.  Fluoroscopy Time: 0.5 minutes.  Comparison:  MRI of the right thigh 09/03/2012.  Findings: The knee was localized under fluoroscopy.  A 20 gauge spinal needle was then advanced into the joint.  Position was confirmed by injecting contrast material.  A single drop of normal appearing joint fluid was aspirated.  7 ml of saline were then injected into the joint and subsequent ratio was attempted but failed.  The patient tolerated the procedure well.  There was no immediate post procedural complication.  Specimen obtained was sent to the laboratory.  IMPRESSION: Knee aspiration as above.  Findings were discussed with Dr. Valentina Gu immediately after procedure.   Original Report Authenticated By: Holley Dexter, M.D.     Scheduled Meds:   . docusate sodium  300 mg Oral QHS  .  enoxaparin  (LOVENOX) injection  30 mg Subcutaneous Q24H  . insulin aspart  0-15 Units Subcutaneous TID WC  . insulin aspart  0-5 Units Subcutaneous QHS  . insulin aspart  4 Units Subcutaneous TID WC  . lamoTRIgine  200 mg Oral Daily  . levothyroxine  150 mcg Oral QAC breakfast  . LORazepam  0.5 mg Intravenous Once  . midazolam  2 mg Intravenous Once  . montelukast  10 mg Oral QHS  . morphine  30 mg Oral Q12H  . [COMPLETED] mupirocin ointment  1 application Nasal BID  . Oxcarbazepine  300 mg Oral Daily  . OXcarbazepine  600 mg Oral QHS  . simvastatin  40 mg Oral QPM  . sodium chloride  10-40 mL Intracatheter Q12H  . sodium chloride  3 mL Intravenous Q12H  . vancomycin  1,500 mg Intravenous Q8H   Continuous Infusions:   . lactated ringers 50 mL/hr at 09/08/12 2010    Principal Problem:  *Right anterior knee pain Active Problems:  HYPOTHYROIDISM  DM type 2 with diabetic peripheral neuropathy  ANEMIA-NOS  BIPOLAR AFFECTIVE DISORDER, DEPRESSED  HYPERTENSION  ASTHMA  GERD  Obesity, morbid (more than 100 lbs over ideal weight or BMI > 40)    Time spent: 40 minutes   [facility]  [facility] Pager [phone]. If 8PM-8AM, please contact night-coverage at [email or web address], password [facility] 09/09/2012, 10:54 AM  LOS: 12 days

## 2012-09-09 NOTE — Clinical Social Work Psychosocial (Signed)
Clinical Social Work Department  BRIEF PSYCHOSOCIAL ASSESSMENT  Patient: Amy Barnes Account Number: [id number]   Admit date: 09/01/12 Clinical Social Worker Date/Time: 09/08/12 Referred by: Physician Date Referred: 09/08/12 Referred for   SNF Placement   Other Referral:  Interview type: Patient and patient's husband Other interview type:  PSYCHOSOCIAL DATA  Living Status: HUSBAND  Admitted from facility:  Level of care:  Primary support name: Amy Barnes  Primary support relationship to patient: Spouse  Degree of support available:  Stong and vested   CURRENT CONCERNS  Current Concerns   Post-Acute Placement   Other Concerns:  SOCIAL WORK ASSESSMENT / PLAN  CSW met with pt re: PT recommendation for SNF.   Pt lives with her husband ina private residence.   CSW explained placement process and answered questions.   Pt's husband would like [facility] or [facility]   CSW completed FL2 and initiated [facility] search   Weekday CSW to f/u with offers.   Assessment/plan status: Information/Referral to [facility]  Other assessment/ plan:  Information/referral to community resources:  SNF   PTAR   PATIENT'S/FAMILY'S RESPONSE TO PLAN OF CARE:  Pt reports agreeable to ST SNF in order to increase strength and independence with mobility prior to return home with her grandson. Pt verbalized understanding of placement process and appreciation for CSW assist.   Sabino Niemann, MSW, [phone]

## 2012-09-09 NOTE — Progress Notes (Signed)
Patient's husband feels that he can manage the patient at home and they are not interested in SNF placement at this time. CSW will inform CM of the need for [facility].  Clinical Social Worker will sign off for now as social work intervention is no longer needed. Please consult [place] again if new need arises.   Sabino Niemann, MSW, [employer] [phone]

## 2012-09-09 NOTE — Progress Notes (Signed)
PT Cancellation Note  Patient Details Name: Amy Barnes MRN: [id number] DOB: 02/17/1951   Cancelled Treatment:    Reason Eval/Treat Not Completed: Pain limiting ability to participate   Fredrich Birks 09/09/2012, 1:48 PM

## 2012-09-09 NOTE — Progress Notes (Signed)
[facility] lab called with lab result from culture grown on abscess. Abscess culture drawn on 12/9 is positive for MRSA. Infectious disease MD made aware.

## 2012-09-09 NOTE — Progress Notes (Signed)
Peripherally Inserted Central Catheter/Midline Placement  The IV Nurse has discussed with the patient and/or persons authorized to consent for the patient, the purpose of this procedure and the potential benefits and risks involved with this procedure.  The benefits include less needle sticks, lab draws from the catheter and patient may be discharged home with the catheter.  Risks include, but not limited to, infection, bleeding, blood clot (thrombus formation), and puncture of an artery; nerve damage and irregular heat beat.  Alternatives to this procedure were also discussed.  PICC/Midline Placement Documentation        Lisabeth Devoid 09/09/2012, 9:50 AM Consent obtained by Lazarus Gowda, RN

## 2012-09-09 NOTE — Progress Notes (Signed)
[facility]    Date of Admission:  08/28/2012   Total days of antibiotics 6        Day 6 Vancomycin           ID: Amy Barnes is a 61 y.o. female with a right thigh abscess. Principal Problem:  *Right anterior knee pain Active Problems:  HYPOTHYROIDISM  DM type 2 with diabetic peripheral neuropathy  ANEMIA-NOS  BIPOLAR AFFECTIVE DISORDER, DEPRESSED  HYPERTENSION  ASTHMA  GERD  Obesity, morbid (more than 100 lbs over ideal weight or BMI > 40)    Subjective: Afebrile. Patient complains of severe pain in her right thigh, knee, and into her calf.  Pain feels like the muscles are tight and twisting.   Medications:     . docusate sodium  300 mg Oral QHS  . enoxaparin (LOVENOX) injection  30 mg Subcutaneous Q24H  . [COMPLETED] fluconazole  100 mg Oral Daily  . insulin aspart  0-15 Units Subcutaneous TID WC  . insulin aspart  0-5 Units Subcutaneous QHS  . insulin aspart  4 Units Subcutaneous TID WC  . lamoTRIgine  200 mg Oral Daily  . levothyroxine  150 mcg Oral QAC breakfast  . LORazepam  0.5 mg Intravenous Once  . midazolam  2 mg Intravenous Once  . montelukast  10 mg Oral QHS  . morphine  30 mg Oral Q12H  . [COMPLETED] mupirocin ointment  1 application Nasal BID  . Oxcarbazepine  300 mg Oral Daily  . OXcarbazepine  600 mg Oral QHS  . simvastatin  40 mg Oral QPM  . sodium chloride  10-40 mL Intracatheter Q12H  . sodium chloride  3 mL Intravenous Q12H  . vancomycin  1,500 mg Intravenous Q8H    Objective: Vital signs in last 24 hours: Temp:  [98.4 F (36.9 C)-98.6 F (37 C)] 98.6 F (37 C) (12/12 0506) Pulse Rate:  [82-85] 85  (12/12 0506) Resp:  [16-18] 16  (12/12 0800) BP: (145-147)/(55-70) 147/70 mmHg (12/12 0506) SpO2:  [95 %-96 %] 96 % (12/12 0800)   General appearance: alert, cooperative and mild distress Resp: clear to auscultation bilaterally Cardio: regular rate and rhythm, S1, S2 normal, no murmur, click, rub or  gallop GI: soft, non-tender; bowel sounds normal; no masses,  no organomegaly Extremities: Right leg is wrapped from proximal thigh to foot, dressing dry and intact  Lab Results  Basename 09/09/12 0537 09/08/12 0500 09/07/12 0545  WBC 5.9 6.2 --  HGB 8.0* 7.7* --  HCT 25.6* 24.0* --  NA 140 -- 137  K 3.6 -- 3.8  CL 101 -- 99  CO2 31 -- 32  BUN 6 -- 6  CREATININE 0.50 -- 0.50  GLU -- -- --    Microbiology: Right thigh aspirate 09/04/12: MRSA Right thigh I&D 09/07/12: Staph Aureus   Blood 09/05/12: Negative   Studies/Results: 09/04/12 12:46PM Fluoro-guided aspiration of right knee: Findings: The knee was localized under fluoroscopy. A 20 gauge  spinal needle was then advanced into the joint. Position was  confirmed by injecting contrast material. A single drop of normal  appearing joint fluid was aspirated. 7 ml of saline were then  injected into the joint and subsequent ratio was attempted but  failed. The patient tolerated the procedure well. There was no  immediate post procedural complication. Specimen obtained was sent  to the laboratory.   09/04/12 14:48 US aspiration of right thigh: IMPRESSION:  1. Technically successful ultrasound guided aspiration of  a  complex fluid collection adjacent to the right femur.  Approximately 35 ml of purulent sanguinous fluid was aspirated.  2. The fluid was sent for cell count, Gram stain and culture.   09/03/12 MRI Right Femur: IMPRESSION:  1. MR findings most consistent with septic arthritis with  dissecting abscess up along the anterior and lateral aspect of the  femur with severe surrounding myositis extending all way up to the  hip.  2. No findings to suggest osteomyelitis.   Assessment/Plan: Amy Barnes is a 60 y.o. female with a past medical history of HTN, DM type 2, hyperlipidemia, obesity, hypothyroidism, bipolar disorder, and multiple orthopedic surgeries who now presents s/p meniscotomy on 09/01/12 and I&D of right thigh  abscess on 09/06/12.  1) Right thigh abscess with MRSA: Patient is being treated appropriately with vancomycin. Patient will need 6 weeks total of treatment.  2) Possible septic right knee: Knee was tapped on 09/04/12 and the fluid was sent to the laboratory. This fluid culture cannot be found in the chart and the lab has no record of it. The abscess fluid from the same day is actually labeled as synovial fluid in the chart. Therefore, we are unsure if the joint is infected or not.  Patient will have right knee aspirated today and fluid will be sent for cell count and culture.    Fredda Hammed [facility] Cell: [phone] Pager: [phone]  09/09/2012, 12:07 PM   INFECTIOUS DISEASE ATTENDING ADDENDUM:     [facility]   Date: 09/09/2012  Patient name: Amy Barnes  Medical record number: [id number]  Date of birth: June 11, 1951    This patient has been seen and discussed with the house staff. Please see their note for complete details. I concur with their findings with the following additions/corrections:  I agree with asking IR to tap the knee for CELL COUNT and differential (first priority) and culture  IF WBC in joint is normal and no org on cultrue can simply proceed with plan of 6 weeks of antibiotics.  Acey Lav 09/09/2012, 6:27 PM

## 2012-09-10 ENCOUNTER — Inpatient Hospital Stay (HOSPITAL_COMMUNITY): Payer: BC Managed Care – PPO

## 2012-09-10 LAB — GLUCOSE, CAPILLARY
Glucose-Capillary: 128 mg/dL — ABNORMAL HIGH (ref 70–99)
Glucose-Capillary: 96 mg/dL (ref 70–99)
Glucose-Capillary: 102 mg/dL — ABNORMAL HIGH (ref 70–99)
Glucose-Capillary: 159 mg/dL — ABNORMAL HIGH (ref 70–99)

## 2012-09-10 LAB — SYNOVIAL CELL COUNT + DIFF, W/ CRYSTALS
Crystals, Fluid: NONE SEEN
Eosinophils-Synovial: 2 % — ABNORMAL HIGH (ref 0–1)
Lymphocytes-Synovial Fld: 9 % (ref 0–20)
Monocyte-Macrophage-Synovial Fluid: 2 % — ABNORMAL LOW (ref 50–90)
Neutrophil, Synovial: 87 % — ABNORMAL HIGH (ref 0–25)

## 2012-09-10 LAB — CBC
HCT: 24.7 % — ABNORMAL LOW (ref 36.0–46.0)
Hemoglobin: 7.8 g/dL — ABNORMAL LOW (ref 12.0–15.0)
MCH: 26.3 pg (ref 26.0–34.0)
MCHC: 31.6 g/dL (ref 30.0–36.0)
MCV: 83.2 fL (ref 78.0–100.0)
Platelets: 336 10*3/uL (ref 150–400)
RBC: 2.97 MIL/uL — ABNORMAL LOW (ref 3.87–5.11)
RDW: 14.9 % (ref 11.5–15.5)
WBC: 5.2 10*3/uL (ref 4.0–10.5)

## 2012-09-10 LAB — VANCOMYCIN, TROUGH: Vancomycin Tr: 22.7 ug/mL — ABNORMAL HIGH (ref 10.0–20.0)

## 2012-09-10 LAB — SEDIMENTATION RATE: Sed Rate: 133 mm/hr — ABNORMAL HIGH (ref 0–22)

## 2012-09-10 MED ORDER — SODIUM CHLORIDE 0.9 % IV SOLN: 1500.0000 mg | Freq: Two times a day (BID) | INTRAVENOUS | Status: DC

## 2012-09-10 MED ORDER — VANCOMYCIN HCL 10 G IV SOLR
1500.0000 mg | Freq: Two times a day (BID) | INTRAVENOUS | Status: DC
  Administered 2012-09-10 – 2012-09-11 (×2): 1500 mg via INTRAVENOUS
  Filled 2012-09-10 (×3): qty 1500

## 2012-09-10 MED ORDER — OXYCODONE HCL 10 MG PO TABS: 10.0000 mg | ORAL_TABLET | ORAL | Status: DC | PRN

## 2012-09-10 MED ORDER — IOHEXOL 180 MG/ML  SOLN
15.0000 mL | Freq: Once | INTRAMUSCULAR | Status: AC | PRN
  Administered 2012-09-10: 15 mL via INTRA_ARTICULAR

## 2012-09-10 MED ORDER — ENOXAPARIN SODIUM 40 MG/0.4ML ~~LOC~~ SOLN
40.0000 mg | Freq: Once | SUBCUTANEOUS | Status: AC
  Administered 2012-09-11: 40 mg via SUBCUTANEOUS
  Filled 2012-09-10: qty 0.4

## 2012-09-10 MED ORDER — KETOROLAC TROMETHAMINE 30 MG/ML IJ SOLN
INTRAMUSCULAR | Status: AC
  Administered 2012-09-10: 30 mg
  Filled 2012-09-10: qty 1

## 2012-09-10 MED ORDER — KETOROLAC TROMETHAMINE 30 MG/ML IJ SOLN
30.0000 mg | Freq: Three times a day (TID) | INTRAMUSCULAR | Status: DC
  Administered 2012-09-10 – 2012-09-11 (×4): 30 mg via INTRAVENOUS
  Filled 2012-09-10 (×5): qty 1

## 2012-09-10 MED ORDER — VANCOMYCIN HCL 10 G IV SOLR: 1500.0000 mg | Freq: Three times a day (TID) | INTRAVENOUS | Status: DC

## 2012-09-10 NOTE — Progress Notes (Signed)
CARE MANAGEMENT NOTE 09/10/2012  Patient:  Amy Barnes, Amy Barnes   Account Number:  [id number]  Date Initiated:  09/01/2012  Documentation initiated by:  Anette Guarneri  Subjective/Objective Assessment:   right meniscal tear  from home     Action/Plan:   home with  [facility].  09/02/12 CM spoke with patient concerning home health and DME needs, choice offered. Patient will need Bariatric 3in1.   Anticipated DC Date:  09/02/2012   Anticipated DC Plan:  HOME W HOME HEALTH SERVICES      DC Planning Services  CM consult      PAC Choice  DURABLE MEDICAL EQUIPMENT  HOME HEALTH   Choice offered to / List presented to:  C-1 Patient   DME arranged  3-N-1  [facility]      DME agency  [facility]     HH arranged  HH-2 PT  HH-1 RN      [facility] agency  [facility]   Status of service:  In process, will continue to follow Medicare Important Message given?   (If response is "NO", the following Medicare IM given date fields will be blank) Date Medicare IM given:   Date Additional Medicare IM given:    Discharge Disposition:  HOME W HOME HEALTH SERVICES  Per UR Regulation:    If discussed at Long Length of Stay Meetings, dates discussed:    Comments:  09/10/12 10:46 Vance Peper, RN BSN Case Manager Patient doesnt want to go to SNF, wants home with [facility]. waiting for final determination. To have I & D of knee today, Cell count and culture pending.   09/08/12 1551 Vance Peper, RN BSN Case Manager patient may be for  shortterm SNF placement. Will be getting PICC line for IV antibiotics.

## 2012-09-10 NOTE — Progress Notes (Signed)
Occupational Therapy Treatment Patient Details Name: Amy Barnes MRN: [id number] DOB: 1951-07-09 Today's Date: 09/10/2012 Time: 1478-2956 OT Time Calculation (min): 18 min  OT Assessment / Plan / Recommendation Comments on Treatment Session Pt refused OOB with OT due to just getting back into bed.  Pt and husband able to verbalize safe technique for BADLs, bed mobility, toilet and shower transfers.      Follow Up Recommendations  No OT follow up    Barriers to Discharge       Equipment Recommendations  None recommended by OT    Recommendations for Other Services    Frequency Min 2X/week   Plan Discharge plan remains appropriate    Precautions / Restrictions Precautions Precautions: Fall Restrictions Weight Bearing Restrictions: Yes RLE Weight Bearing: Weight bearing as tolerated   Pertinent Vitals/Pain     ADL  ADL Comments: Pt report she is just back to bed (RN and husband confirm), and pt. refused to get up again with OT.  Spoke with pt and husband re: plan for home, how to safely perform BADLs (pt has AE, and able to verbalize appropriate use).  Also encouraged pt to ambulate to BR rather than using BSC.  Pt instead states her plan is to start Bedside, then move it incrementally toward the BR each time as she is able.  Pt and husband able to state appropriate method for bed mobility and shower transfers.  Husband feels confident in pt's abillities and his ability to assist her.      OT Diagnosis:    OT Problem List:   OT Treatment Interventions:     OT Goals ADL Goals ADL Goal: Lower Body Dressing - Progress: Not progressing ADL Goal: Toilet Transfer - Progress: Not progressing Miscellaneous OT Goals OT Goal: Miscellaneous Goal #1 - Progress: Not progressing  Visit Information  Last OT Received On: 09/10/12 Assistance Needed: +1    Subjective Data      Prior Functioning       Cognition  Overall Cognitive Status: Appears within functional limits for tasks  assessed/performed Arousal/Alertness: Awake/alert Orientation Level: Appears intact for tasks assessed Behavior During Session: [facility] for tasks performed    Mobility  Shoulder Instructions Bed Mobility Supine to Sit: 4: Min guard;With rails Sitting - Scoot to Edge of Bed: 4: Min guard;With rail Details for Bed Mobility Assistance: Patient with heavy reliance on trapeze bar but able to do without physical assist.  Transfers Sit to Stand: 4: Min assist;With upper extremity assist;From bed;From toilet Stand to Sit: 4: Min guard;With upper extremity assist;To toilet;To chair/3-in-1 Details for Transfer Assistance: A to initiate stand and to ensure stability.        Exercises      Balance     End of Session OT - End of Session Patient left: in chair;with call bell/phone within reach;with family/visitor present Nurse Communication: Patient requests pain meds  GO     Brightyn Mozer M 09/10/2012, 6:00 PM

## 2012-09-10 NOTE — Progress Notes (Addendum)
ANTIBIOTIC CONSULT NOTE - FOLLOW UP  Pharmacy Consult for vancomycin Indication: Leg abscess  Labs:  Basename 09/10/12 0559 09/09/12 0537 09/08/12 0500  WBC 5.2 5.9 6.2  HGB 7.8* 8.0* 7.7*  PLT 336 366 358  LABCREA -- -- --  CREATININE -- 0.50 --   Estimated Creatinine Clearance: 113.9 ml/min (by C-G formula based on Cr of 0.5).  Basename 09/10/12 1330 09/07/12 2215  VANCOTROUGH 22.7* 19.9  VANCOPEAK -- --  Drue Dun -- --  GENTTROUGH -- --  GENTPEAK -- --  GENTRANDOM -- --  TOBRATROUGH -- --  TOBRAPEAK -- --  TOBRARND -- --  AMIKACINPEAK -- --  AMIKACINTROU -- --  AMIKACIN -- --     Microbiology: Recent Results (from the past 720 hour(s))  SURGICAL PCR SCREEN     Status: Abnormal   Collection Time   08/29/12  5:43 AM      Component Value Range Status Comment   MRSA, PCR POSITIVE (*) NEGATIVE Final    Staphylococcus aureus POSITIVE (*) NEGATIVE Final   URINE CULTURE     Status: Normal   Collection Time   08/30/12  3:11 PM      Component Value Range Status Comment   Specimen Description URINE, CATHETERIZED   Final    Special Requests NONE   Final    Culture  Setup Time 08/30/2012 15:34   Final    Colony Count >=100,000 COLONIES/ML   Final    Culture KLEBSIELLA PNEUMONIAE   Final    Report Status 09/01/2012 FINAL   Final    Organism ID, Bacteria KLEBSIELLA PNEUMONIAE   Final   BODY FLUID CULTURE     Status: Normal   Collection Time   09/04/12  2:30 PM      Component Value Range Status Comment   Specimen Description SYNOVIAL RIGHT KNEE   Final    Special Requests NONE   Final    Gram Stain     Final    Value: WBC PRESENT,BOTH PMN AND MONONUCLEAR     NO ORGANISMS SEEN   Culture     Final    Value: MODERATE METHICILLIN RESISTANT STAPHYLOCOCCUS AUREUS     Note: RIFAMPIN AND GENTAMICIN SHOULD NOT BE USED AS SINGLE DRUGS FOR TREATMENT OF STAPH INFECTIONS. CRITICAL RESULT CALLED TO, READ BACK BY AND VERIFIED WITH: CAROL H @ 1128 09/06/12 BY KRAWS CRITICAL RESULT  CALLED TO, READ BACK BY AND VERIFIED WITH:      CAROL H @ 1302 09/06/12 BY KRAWS   Report Status 09/07/2012 FINAL   Final    Organism ID, Bacteria METHICILLIN RESISTANT STAPHYLOCOCCUS AUREUS   Final   CULTURE, BLOOD (ROUTINE X 2)     Status: Normal (Preliminary result)   Collection Time   09/05/12  4:51 PM      Component Value Range Status Comment   Specimen Description BLOOD LEFT ARM   Final    Special Requests BOTTLES DRAWN AEROBIC AND ANAEROBIC 10CC   Final    Culture  Setup Time 09/05/2012 20:57   Final    Culture     Final    Value:        BLOOD CULTURE RECEIVED NO GROWTH TO DATE CULTURE WILL BE HELD FOR 5 DAYS BEFORE ISSUING A FINAL NEGATIVE REPORT   Report Status PENDING   Incomplete   CULTURE, BLOOD (ROUTINE X 2)     Status: Normal (Preliminary result)   Collection Time   09/05/12  4:56 PM  Component Value Range Status Comment   Specimen Description BLOOD LEFT HAND   Final    Special Requests     Final    Value: BOTTLES DRAWN AEROBIC AND ANAEROBIC 10CC AER,8CC ANA   Culture  Setup Time 09/05/2012 20:57   Final    Culture     Final    Value:        BLOOD CULTURE RECEIVED NO GROWTH TO DATE CULTURE WILL BE HELD FOR 5 DAYS BEFORE ISSUING A FINAL NEGATIVE REPORT   Report Status PENDING   Incomplete   ANAEROBIC CULTURE     Status: Normal (Preliminary result)   Collection Time   09/06/12 12:00 PM      Component Value Range Status Comment   Specimen Description ABSCESS RIGHT THIGH   Final    Special Requests PATIENT ON FOLLOWING VANCOMYCIN   Final    Gram Stain PENDING   Incomplete    Culture     Final    Value: NO ANAEROBES ISOLATED; CULTURE IN PROGRESS FOR 5 DAYS   Report Status PENDING   Incomplete   CULTURE, ROUTINE-ABSCESS     Status: Normal   Collection Time   09/06/12 12:00 PM      Component Value Range Status Comment   Specimen Description ABSCESS RIGHT THIGH   Final    Special Requests PATIENT ON FOLLOWING VANCOMYCIN   Final    Gram Stain     Final    Value: RARE WBC  PRESENT, PREDOMINANTLY PMN     RARE SQUAMOUS EPITHELIAL CELLS PRESENT     NO ORGANISMS SEEN   Culture     Final    Value: FEW METHICILLIN RESISTANT STAPHYLOCOCCUS AUREUS     Note: RIFAMPIN AND GENTAMICIN SHOULD NOT BE USED AS SINGLE DRUGS FOR TREATMENT OF STAPH INFECTIONS. CRITICAL RESULT CALLED TO, READ BACK BY AND VERIFIED WITH: C.OBRAIN 12/12 @1000  BY REAMM   Report Status 09/09/2012 FINAL   Final    Organism ID, Bacteria METHICILLIN RESISTANT STAPHYLOCOCCUS AUREUS   Final   FUNGUS CULTURE W SMEAR     Status: Normal (Preliminary result)   Collection Time   09/06/12 12:00 PM      Component Value Range Status Comment   Specimen Description ABSCESS RIGHT THIGH   Final    Special Requests PATIENT ON FOLLOWING VANCOMYCIN   Final    Fungal Smear NO YEAST OR FUNGAL ELEMENTS SEEN   Final    Culture CULTURE IN PROGRESS FOR FOUR WEEKS   Final    Report Status PENDING   Incomplete   GRAM STAIN     Status: Normal   Collection Time   09/06/12 12:00 PM      Component Value Range Status Comment   Specimen Description ABSCESS RIGHT THIGH   Final    Special Requests PATIENT ON FOLLOWING VANCOMYCIN   Final    Gram Stain     Final    Value: RARE WBC PRESENT,BOTH PMN AND MONONUCLEAR     NO ORGANISMS SEEN     CALLED TO DR HANDY 09/06/12 1525 BY K SCHULTZ   Report Status 09/06/2012 FINAL   Final     Anti-infectives     Start     Dose/Rate Route Frequency Ordered Stop   09/10/12 0000   sodium chloride 0.9 % SOLN 500 mL with vancomycin 10 G SOLR 1,500 mg        1,500 mg 250 mL/hr over 120 Minutes Intravenous 3 times daily 09/10/12 0944 10/22/12  2359   09/07/12 1030   fluconazole (DIFLUCAN) tablet 100 mg        100 mg Oral Daily 09/07/12 0919 09/08/12 1042   09/06/12 1030   fluconazole (DIFLUCAN) IVPB 100 mg  Status:  Discontinued        100 mg 50 mL/hr over 60 Minutes Intravenous Every 24 hours 09/06/12 1017 09/07/12 0919   09/06/12 0600   vancomycin (VANCOCIN) 1,500 mg in sodium chloride 0.9 %  500 mL IVPB        1,500 mg 250 mL/hr over 120 Minutes Intravenous Every 8 hours 09/05/12 2313     09/04/12 1200   imipenem-cilastatin (PRIMAXIN) 500 mg in sodium chloride 0.9 % 100 mL IVPB  Status:  Discontinued        500 mg 200 mL/hr over 30 Minutes Intravenous 4 times per day 09/04/12 0923 09/08/12 0814   09/04/12 1000   vancomycin (VANCOCIN) 1,500 mg in sodium chloride 0.9 % 500 mL IVPB  Status:  Discontinued        1,500 mg 250 mL/hr over 120 Minutes Intravenous Every 12 hours 09/04/12 0923 09/05/12 2312   09/02/12 0000   vancomycin (VANCOCIN) IVPB 1000 mg/200 mL premix        1,000 mg 200 mL/hr over 60 Minutes Intravenous Every 12 hours 09/01/12 1642 09/02/12 0102   09/01/12 1700   levofloxacin (LEVAQUIN) tablet 500 mg        500 mg Oral Every 24 hr x 2 09/01/12 0915 09/02/12 1634   09/01/12 1230   vancomycin (VANCOCIN) 1,000 mg in sodium chloride 0.9 % 250 mL IVPB  Status:  Discontinued        1,000 mg 250 mL/hr over 60 Minutes Intravenous  Once 09/01/12 1219 09/01/12 1226   09/01/12 1230   vancomycin (VANCOCIN) 2,000 mg in sodium chloride 0.9 % 500 mL IVPB        2,000 mg 250 mL/hr over 120 Minutes Intravenous To Surgery 09/01/12 1226 09/01/12 1208   08/30/12 1700   levofloxacin (LEVAQUIN) IVPB 500 mg  Status:  Discontinued        500 mg 100 mL/hr over 60 Minutes Intravenous Every 24 hours 08/30/12 1649 09/01/12 0915         Assessment: 61 yo female on D7 vancomycin for leg abscess associated with myositis s/p I&D. Plan for 6 wks of vanc. Trough came back at 22.5 today. Since she is going home today, I will change her regimen to q12 for ease of administration.   Goal of Therapy:  Vancomycin trough 10-20 mcg/mL   Plan:  1. Change vanc to 1.5g IV q12h, start tonight at 10 pm then 10am/10pm 2. Home health please check another vanc trough/bmet around 12/18  Amy Barnes [place]  09/10/2012,2:57 PM

## 2012-09-10 NOTE — Progress Notes (Signed)
Physical Therapy Treatment Patient Details Name: Amy Barnes MRN: [id number] DOB: 1951-03-31 Today's Date: 09/10/2012 Time: 4098-1191 PT Time Calculation (min): 32 min  PT Assessment / Plan / Recommendation Comments on Treatment Session  Patient motivated and pushing through her pain this session. Key is for patient to be premedicated.  Patient in the room and is very helpful and knows how to assist with mobility. Patient and husband have refused SNF and are planning on returning home. Patient planniing on having hospital bed and would recommend ambulance transport home. Will advise Child psychotherapist. Spent time giong over education regarding keeping up mobility at home and the consequences of staying in bed  due to pain verses trying to mobiilze as she can tolerate. Patient and husband seem to be in agreeance to the previous statement    Follow Up Recommendations  Home health PT;Supervision/Assistance - 24 hour     Does the patient have the potential to tolerate intense rehabilitation     Barriers to Discharge        Equipment Recommendations  None recommended by PT    Recommendations for Other Services    Frequency Min 5X/week   Plan Discharge plan remains appropriate;Frequency remains appropriate    Precautions / Restrictions Precautions Precautions: Fall Restrictions RLE Weight Bearing: Weight bearing as tolerated   Pertinent Vitals/Pain     Mobility  Bed Mobility Supine to Sit: 4: Min guard;With rails Sitting - Scoot to Edge of Bed: 4: Min guard;With rail Details for Bed Mobility Assistance: Patient with heavy reliance on trapeze bar but able to do without physical assist.  Transfers Sit to Stand: 4: Min assist;With upper extremity assist;From bed;From toilet Stand to Sit: 4: Min guard;With upper extremity assist;To toilet;To chair/3-in-1 Details for Transfer Assistance: A to initiate stand and to ensure stability.  Ambulation/Gait Ambulation/Gait Assistance: 4: Min  assist Ambulation Distance (Feet): 30 Feet Assistive device: Rolling walker Ambulation/Gait Assistance Details: Cues for positioning and safety with RW Gait Pattern: Step-to pattern;Wide base of support;Shuffle;Antalgic Gait velocity: decreased    Exercises     PT Diagnosis:    PT Problem List:   PT Treatment Interventions:     PT Goals Acute Rehab PT Goals PT Goal: Supine/Side to Sit - Progress: Progressing toward goal PT Goal: Sit to Stand - Progress: Progressing toward goal PT Goal: Stand to Sit - Progress: Progressing toward goal PT Goal: Ambulate - Progress: Progressing toward goal PT Goal: Perform Home Exercise Program - Progress: Progressing toward goal  Visit Information  Last PT Received On: 09/10/12 Assistance Needed: +1 (+2 helpful if availible)    Subjective Data      Cognition  Overall Cognitive Status: Appears within functional limits for tasks assessed/performed Arousal/Alertness: Awake/alert Orientation Level: Appears intact for tasks assessed Behavior During Session: [facility] for tasks performed    Balance     End of Session PT - End of Session Activity Tolerance: Patient tolerated treatment well Patient left: in chair;with call bell/phone within reach;with family/visitor present Nurse Communication: Mobility status   GP     Fredrich Birks 09/10/2012, 3:09 PM 09/10/2012 Fredrich Birks PTA [phone] pager [phone] office

## 2012-09-10 NOTE — Progress Notes (Signed)
CARE MANAGEMENT NOTE 09/10/2012 Comments:  09/10/12 1630 Vance Peper, RN BSN Case Manager CM received call from San Jetty, Case Manager with [employer] [phone] Fax: [phone]    09/10/12 10:46 Vance Peper, RN BSN Case Manager Patient doesnt want to go to SNF, wants home with [facility]. waiting for final determination. To have I & D of knee today, Cell count and culture pending.

## 2012-09-10 NOTE — Discharge Summary (Addendum)
Physician Discharge Summary  Amy Barnes MRN: [id number] DOB/AGE: 1950/11/30 61 y.o.  PCP: [name], MD   Admit date: 08/28/2012 Discharge date: 09/10/2012  Discharge Diagnoses:     *Right anterior knee pain Active Problems:  HYPOTHYROIDISM  DM type 2 with diabetic peripheral neuropathy  ANEMIA-NOS  BIPOLAR AFFECTIVE DISORDER, DEPRESSED  HYPERTENSION  ASTHMA  GERD  Obesity, morbid (more than 100 lbs over ideal weight or BMI > 40)     Medication List     As of 09/10/2012  9:55 AM    STOP taking these medications         ibuprofen 800 MG tablet   Commonly known as: ADVIL,MOTRIN      TAKE these medications         acetaminophen 500 MG tablet   Commonly known as: TYLENOL   Take 1,000 mg by mouth every 6 (six) hours as needed. For pain      albuterol 108 (90 BASE) MCG/ACT inhaler   Commonly known as: PROVENTIL HFA;VENTOLIN HFA   Inhale 2 puffs into the lungs every 6 (six) hours as needed. For shortness of breath      aspirin 81 MG EC tablet   Take 81 mg by mouth daily.      CALCIUM + D PO   Take 1 tablet by mouth daily.      CENTRUM SILVER PO   Take 1 tablet by mouth daily.      cyclobenzaprine 10 MG tablet   Commonly known as: FLEXERIL   Take 1 tablet (10 mg total) by mouth 3 (three) times daily as needed. For muscle pain      docusate sodium 100 MG capsule   Commonly known as: COLACE   Take 300 mg by mouth at bedtime.      enoxaparin 80 MG/0.8ML injection   Commonly known as: LOVENOX   Inject 0.8 mLs (80 mg total) into the skin daily.      furosemide 80 MG tablet   Commonly known as: LASIX   Take 0.5 tablets (40 mg total) by mouth daily. For fluid      IRON PO   Take 75 mg by mouth daily.      lamoTRIgine 200 MG tablet   Commonly known as: LAMICTAL   Take 200 mg by mouth daily.      levothyroxine 150 MCG tablet   Commonly known as: SYNTHROID, LEVOTHROID   Take 150 mcg by mouth daily.      Liraglutide 18 MG/3ML Soln   Inject 1.8  mg into the skin daily.      losartan 50 MG tablet   Commonly known as: COZAAR   Take 50 mg by mouth daily.      metFORMIN 500 MG 24 hr tablet   Commonly known as: GLUCOPHAGE-XR   Take 500 mg by mouth 3 (three) times daily.      montelukast 10 MG tablet   Commonly known as: SINGULAIR   Take 10 mg by mouth at bedtime.      morphine 15 MG 12 hr tablet   Commonly known as: MS CONTIN   Take 30 mg by mouth 2 (two) times daily.      Oxcarbazepine 300 MG tablet   Commonly known as: TRILEPTAL   Take 300-600 mg by mouth 2 (two) times daily. 1 in the morning and 2 every evening      Oxycodone HCl 10 MG Tabs   Take 1 tablet (10 mg total) by  mouth every 4 (four) hours as needed.      oxyCODONE-acetaminophen 10-325 MG per tablet   Commonly known as: PERCOCET   Take 1 tablet by mouth every 8 (eight) hours as needed. For pain      predniSONE 10 MG tablet   Commonly known as: DELTASONE   Take 10 mg by mouth daily. Take for 5 days. Started medication on 08/25/12      promethazine 25 MG tablet   Commonly known as: PHENERGAN   Take 25 mg by mouth every 6 (six) hours as needed. For nausea      simvastatin 40 MG tablet   Commonly known as: ZOCOR   Take 40 mg by mouth every evening.      sodium chloride 0.9 % SOLN 500 mL with vancomycin 10 G SOLR 1,500 mg   Inject 1,500 mg into the vein 3 (three) times daily.        Discharge Condition: Stable  Disposition: 01-Home or Self Care   Consults:  #1 orthopedics Infectious disease    Significant Diagnostic Studies: Mr Lumbar Spine W Wo Contrast  08/31/2012  *RADIOLOGY REPORT*  Clinical Data: Severe right leg and knee pain.  Lumbar spine surgery 01/08/2012.  MRI LUMBAR SPINE WITHOUT AND WITH CONTRAST  Technique:  Multiplanar and multiecho pulse sequences of the lumbar spine were obtained without and with intravenous contrast.  Contrast: 20mL MULTIHANCE GADOBENATE DIMEGLUMINE 529 MG/ML IV SOLN  Comparison:  None available.  Findings:   Normal signal is present in the conus medullaris which terminates at L1-2.  Patient is status post L3-4 PLIF.  The marrow signal, vertebral body heights, and alignment are otherwise normal. Limited imaging of the abdomen is unremarkable.  L1-2:  Negative.  L2-3:  Mild facet hypertrophy is present.  There is slight disc bulging.  No significant stenosis is present.  L3-4:  The patient is status post fusion.  Bilateral foraminotomies were performed.  The central canal and foramina are widely patent. The disc spacers are well positioned.  L4-5:  A mild broad-based disc herniation is present.  Facet hypertrophy is noted bilaterally.  This leads to mild distortion of the central canal without significant stenosis.  L5-S1:  A rightward disc herniation potentially contacts the exiting L5 nerve root within the foramen.  IMPRESSION:  1.  Status post L3-4 PLIF without evidence for residual or recurrent stenosis. 2.  Rightward disc herniation at L5-S1 potentially contacts the exiting right L5 nerve root. 3.  Slight disc bulging at to L2-3 and L4-5 without significant stenosis at either level.   Original Report Authenticated By: Marin Roberts, M.D.    Dg Pelvis Portable  09/01/2012  *RADIOLOGY REPORT*  Clinical Data: Right hip pain.  PORTABLE PELVIS  Comparison: None  Findings: There are mild right hip joint degenerative changes but no acute bony findings or plain film evidence of avascular necrosis.  Pubic symphysis and SI joints are intact.  A left hip prosthesis is noted along with lumbar fusion hardware.  IMPRESSION: Degenerative changes but no acute bony findings.   Original Report Authenticated By: Rudie Meyer, M.D.    Mr Femur Right W Wo Contrast  09/04/2012  *RADIOLOGY REPORT*  Clinical Data: Severe right leg pain.  Recent patellar surgery.  MRI RIGHT THIGH WITHOUT CONTRAST  Technique:  Multiplanar, multisequence MR imaging of the right thigh was performed.  No intravenous contrast was administered.   Comparison:   None  Findings:  There is a large complex the joint effusion with thick  irregular synovial enhancement worrisome for septic arthritis.  No definite findings for osteomyelitis.  Extending out from a suprapatellar bursa and along the anterior and lateral aspect of the femur is an elongated abscess.  This extends approximately halfway up the femur measuring a total of approximately 20 cm in length.  There is severe surrounding myositis involving the thigh musculature extending all way up to the hip.  No MR findings to suggest osteomyelitis.  There is surrounding cellulitis.  IMPRESSION:  1.  MR findings most consistent with septic arthritis with dissecting abscess up along the anterior and lateral aspect of the femur with severe surrounding myositis extending all way up to the hip. 2.  No findings to suggest osteomyelitis.   Original Report Authenticated By: Rudie Meyer, M.D.    US Aspiration  09/04/2012  *RADIOLOGY REPORT*  Clinical Data: Deep tissue fluid collection about the right femur. Pain.  Fevers.  ULTRASOUND GUIDED ASPIRATION.  Comparison:  Right femur MRI 09/03/2012.  Sedation:  None  Procedure:  The procedure, risks, benefits, and alternatives were explained to the patient.  Questions regarding the procedure were encouraged and answered.  The patient understands and consents to the procedure.  The anterior right thigh was prepped with betadine in a sterile fashion, and a sterile drape was applied covering the operative field.  A sterile gown and sterile gloves were used for the procedure. Local anesthesia was provided with 1% Lidocaine.  The fluid collection was identified anterior and lateral to the right femur.  Following local anesthesia, a 20 gauge needle was advanced into the fluid collection.  A 30 ml syringe was filled with somewhat purulent and sanguinous material.  Using a 10 ml syringe I was able to obtain an additional 5 mm of fluid from a slightly more lateral aspect of the  collection.  The needle was then removed.  Hemostasis was achieved.  Complications: None.  IMPRESSION:  1.  Technically successful ultrasound guided aspiration of a complex fluid collection adjacent to the right femur. Approximately 35 ml of purulent sanguinous fluid was aspirated. 2.  The fluid was sent for cell count, Gram stain and culture.   Original Report Authenticated By: Marin Roberts, M.D.    Dg Chest Port 1 View  09/01/2012  *RADIOLOGY REPORT*  Clinical Data: Status post central line placement.  PORTABLE CHEST - 1 VIEW  Comparison: Chest radiograph 08/30/2012  Findings: A right IJ central venous catheter is in place.  The distal tip projects over the distal superior vena cava.  Heart and mediastinal contours are stable.  Pulmonary vascular prominence appears similar to recent prior study.  Lung volumes are low.  No focal airspace disease or pleural effusion.  Postsurgical changes of the lower cervical spine.  Negative for pneumothorax.  No subcutaneous gas in the chest wall.  IMPRESSION:  Satisfactory position of right IJ central venous catheter.  No complicating feature.   Original Report Authenticated By: Britta Mccreedy, M.D.    Dg Chest Port 1 View  08/30/2012  *RADIOLOGY REPORT*  Clinical Data: Cough and weakness.  PORTABLE CHEST - 1 VIEW  Comparison: 01/08/2012 and 01/02/2012.  Findings: Central pulmonary vascular prominence without pulmonary edema.  No segmental infiltrate or gross pneumothorax.  Calcified slightly tortuous aorta.  Heart size top normal.  Postsurgical changes lower cervical spine.  IMPRESSION: No acute abnormality.  If there is a persistent unexplained cough, follow-up two-view exam may be considered.   Original Report Authenticated By: Lacy Duverney, M.D.    Dg Knee Right  Port  08/29/2012  *RADIOLOGY REPORT*  Clinical Data: Severe right knee pain.  PORTABLE RIGHT KNEE - 1-2 VIEW  Comparison: None.  Findings: There is marked narrowing of the medial compartment with small  marginal osteophytes. Slight sclerosis of the medial compartment.  Lateral and patellofemoral compartments appear essentially normal.  No appreciable joint effusion.  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   Original Report Authenticated By: Francene Boyers, M.D.    Dg Fluoro Guide Ndl Plc/bx  09/04/2012  *RADIOLOGY REPORT*  Clinical Data:  Knee pain.  Possible septic joint.  ARTHROCENTESIS/INJECTION OF LARGE JOINT  Technique: There is some benefits of the procedure were discussed with the patient.  She was given an opportunity to ask questions. Written consent was then obtained.  Time-out was performed for the procedure.  Fluoroscopy Time: 0.5 minutes.  Comparison:  MRI of the right thigh 09/03/2012.  Findings: The knee was localized under fluoroscopy.  A 20 gauge spinal needle was then advanced into the joint.  Position was confirmed by injecting contrast material.  A single drop of normal appearing joint fluid was aspirated.  7 ml of saline were then injected into the joint and subsequent ratio was attempted but failed.  The patient tolerated the procedure well.  There was no immediate post procedural complication.  Specimen obtained was sent to the laboratory.  IMPRESSION: Knee aspiration as above.  Findings were discussed with Dr. Valentina Gu immediately after procedure.   Original Report Authenticated By: Holley Dexter, M.D.        Microbiology: Recent Results (from the past 240 hour(s))  BODY FLUID CULTURE     Status: Normal   Collection Time   09/04/12  2:30 PM      Component Value Range Status Comment   Specimen Description SYNOVIAL RIGHT KNEE   Final    Special Requests NONE   Final    Gram Stain     Final    Value: WBC PRESENT,BOTH PMN AND MONONUCLEAR     NO ORGANISMS SEEN   Culture     Final    Value: MODERATE METHICILLIN RESISTANT STAPHYLOCOCCUS AUREUS     Note: RIFAMPIN AND GENTAMICIN SHOULD NOT BE USED AS SINGLE DRUGS FOR TREATMENT OF STAPH  INFECTIONS. CRITICAL RESULT CALLED TO, READ BACK BY AND VERIFIED WITH: CAROL H @ 1128 09/06/12 BY KRAWS CRITICAL RESULT CALLED TO, READ BACK BY AND VERIFIED WITH:      CAROL H @ 1302 09/06/12 BY KRAWS   Report Status 09/07/2012 FINAL   Final    Organism ID, Bacteria METHICILLIN RESISTANT STAPHYLOCOCCUS AUREUS   Final   CULTURE, BLOOD (ROUTINE X 2)     Status: Normal (Preliminary result)   Collection Time   09/05/12  4:51 PM      Component Value Range Status Comment   Specimen Description BLOOD LEFT ARM   Final    Special Requests BOTTLES DRAWN AEROBIC AND ANAEROBIC 10CC   Final    Culture  Setup Time 09/05/2012 20:57   Final    Culture     Final    Value:        BLOOD CULTURE RECEIVED NO GROWTH TO DATE CULTURE WILL BE HELD FOR 5 DAYS BEFORE ISSUING A FINAL NEGATIVE REPORT   Report Status PENDING   Incomplete   CULTURE, BLOOD (ROUTINE X 2)     Status: Normal (Preliminary result)   Collection Time   09/05/12  4:56 PM      Component Value  Range Status Comment   Specimen Description BLOOD LEFT HAND   Final    Special Requests     Final    Value: BOTTLES DRAWN AEROBIC AND ANAEROBIC 10CC AER,8CC ANA   Culture  Setup Time 09/05/2012 20:57   Final    Culture     Final    Value:        BLOOD CULTURE RECEIVED NO GROWTH TO DATE CULTURE WILL BE HELD FOR 5 DAYS BEFORE ISSUING A FINAL NEGATIVE REPORT   Report Status PENDING   Incomplete   ANAEROBIC CULTURE     Status: Normal (Preliminary result)   Collection Time   09/06/12 12:00 PM      Component Value Range Status Comment   Specimen Description ABSCESS RIGHT THIGH   Final    Special Requests PATIENT ON FOLLOWING VANCOMYCIN   Final    Gram Stain PENDING   Incomplete    Culture     Final    Value: NO ANAEROBES ISOLATED; CULTURE IN PROGRESS FOR 5 DAYS   Report Status PENDING   Incomplete   CULTURE, ROUTINE-ABSCESS     Status: Normal   Collection Time   09/06/12 12:00 PM      Component Value Range Status Comment   Specimen Description ABSCESS RIGHT  THIGH   Final    Special Requests PATIENT ON FOLLOWING VANCOMYCIN   Final    Gram Stain     Final    Value: RARE WBC PRESENT, PREDOMINANTLY PMN     RARE SQUAMOUS EPITHELIAL CELLS PRESENT     NO ORGANISMS SEEN   Culture     Final    Value: FEW METHICILLIN RESISTANT STAPHYLOCOCCUS AUREUS     Note: RIFAMPIN AND GENTAMICIN SHOULD NOT BE USED AS SINGLE DRUGS FOR TREATMENT OF STAPH INFECTIONS. CRITICAL RESULT CALLED TO, READ BACK BY AND VERIFIED WITH: C.OBRAIN 12/12 @1000  BY REAMM   Report Status 09/09/2012 FINAL   Final    Organism ID, Bacteria METHICILLIN RESISTANT STAPHYLOCOCCUS AUREUS   Final   FUNGUS CULTURE W SMEAR     Status: Normal (Preliminary result)   Collection Time   09/06/12 12:00 PM      Component Value Range Status Comment   Specimen Description ABSCESS RIGHT THIGH   Final    Special Requests PATIENT ON FOLLOWING VANCOMYCIN   Final    Fungal Smear NO YEAST OR FUNGAL ELEMENTS SEEN   Final    Culture CULTURE IN PROGRESS FOR FOUR WEEKS   Final    Report Status PENDING   Incomplete   GRAM STAIN     Status: Normal   Collection Time   09/06/12 12:00 PM      Component Value Range Status Comment   Specimen Description ABSCESS RIGHT THIGH   Final    Special Requests PATIENT ON FOLLOWING VANCOMYCIN   Final    Gram Stain     Final    Value: RARE WBC PRESENT,BOTH PMN AND MONONUCLEAR     NO ORGANISMS SEEN     CALLED TO DR HANDY 09/06/12 1525 BY K SCHULTZ   Report Status 09/06/2012 FINAL   Final      Labs: Results for orders placed during the hospital encounter of 08/28/12 (from the past 48 hour(s))  GLUCOSE, CAPILLARY     Status: Normal   Collection Time   09/08/12 11:27 AM      Component Value Range Comment   Glucose-Capillary 91  70 - 99 mg/dL    Comment 1  Notify RN     GLUCOSE, CAPILLARY     Status: Abnormal   Collection Time   09/08/12  5:00 PM      Component Value Range Comment   Glucose-Capillary 130 (*) 70 - 99 mg/dL   GLUCOSE, CAPILLARY     Status: Abnormal    Collection Time   09/08/12 10:11 PM      Component Value Range Comment   Glucose-Capillary 115 (*) 70 - 99 mg/dL   CBC     Status: Abnormal   Collection Time   09/09/12  5:37 AM      Component Value Range Comment   WBC 5.9  4.0 - 10.5 K/uL    RBC 3.05 (*) 3.87 - 5.11 MIL/uL    Hemoglobin 8.0 (*) 12.0 - 15.0 g/dL    HCT 29.5 (*) 62.1 - 46.0 %    MCV 83.9  78.0 - 100.0 fL    MCH 26.2  26.0 - 34.0 pg    MCHC 31.3  30.0 - 36.0 g/dL    RDW 30.8  65.7 - 84.6 %    Platelets 366  150 - 400 K/uL   BASIC METABOLIC PANEL     Status: Abnormal   Collection Time   09/09/12  5:37 AM      Component Value Range Comment   Sodium 140  135 - 145 mEq/L    Potassium 3.6  3.5 - 5.1 mEq/L    Chloride 101  96 - 112 mEq/L    CO2 31  19 - 32 mEq/L    Glucose, Bld 123 (*) 70 - 99 mg/dL    BUN 6  6 - 23 mg/dL    Creatinine, Ser 9.62  0.50 - 1.10 mg/dL    Calcium 8.9  8.4 - 95.2 mg/dL    GFR calc non Af Amer >90  >90 mL/min    GFR calc Af Amer >90  >90 mL/min   GLUCOSE, CAPILLARY     Status: Abnormal   Collection Time   09/09/12  7:01 AM      Component Value Range Comment   Glucose-Capillary 114 (*) 70 - 99 mg/dL   GLUCOSE, CAPILLARY     Status: Normal   Collection Time   09/09/12 11:20 AM      Component Value Range Comment   Glucose-Capillary 79  70 - 99 mg/dL    Comment 1 Documented in Chart      Comment 2 Notify RN     GLUCOSE, CAPILLARY     Status: Abnormal   Collection Time   09/09/12  4:40 PM      Component Value Range Comment   Glucose-Capillary 123 (*) 70 - 99 mg/dL    Comment 1 Documented in Chart      Comment 2 Notify RN     GLUCOSE, CAPILLARY     Status: Abnormal   Collection Time   09/09/12  9:18 PM      Component Value Range Comment   Glucose-Capillary 114 (*) 70 - 99 mg/dL   CBC     Status: Abnormal   Collection Time   09/10/12  5:59 AM      Component Value Range Comment   WBC 5.2  4.0 - 10.5 K/uL    RBC 2.97 (*) 3.87 - 5.11 MIL/uL    Hemoglobin 7.8 (*) 12.0 - 15.0 g/dL     HCT 84.1 (*) 32.4 - 46.0 %    MCV 83.2  78.0 - 100.0 fL  MCH 26.3  26.0 - 34.0 pg    MCHC 31.6  30.0 - 36.0 g/dL    RDW 16.1  09.6 - 04.5 %    Platelets 336  150 - 400 K/uL   GLUCOSE, CAPILLARY     Status: Abnormal   Collection Time   09/10/12  7:00 AM      Component Value Range Comment   Glucose-Capillary 128 (*) 70 - 99 mg/dL      HPI :*  61 year old [place] female presented with knee pain . She has had severe right knee pain.Onset of symptoms was gradual starting a few weeks ago with rapidly worsening course since that time. The patient noted no past surgery on the left knee. Prior procedures on the knee include none. Patient has been treated conservatively with over-the-counter NSAIDs and activity modification. Patient currently rates pain in the knee at 10 out of 10 with activity. There is pain at night. Patient admitted for pain control. She was found to have acute right knee pain due to medial and lateral meniscal tear, she is unable to stand up or bear weight on it, orthopedics did a Right knee arthroscopy with partial medial and lateral meniscectomy.  09/01/2012.  She had seen Dr. Lovell Sheehan in the office last week. Dr. Lovell Sheehan thought the pain was more likely coming from her knee but he ordered a lumbar MRI just to make sure that there was a significant lumbar spine pathology contributing to her pain. She had not gotten the MRI scan yet and was admitted for pain control. The patient's lumbar spine MRI showed results as above. Details hospital course as follows   HOSPITAL COURSE:  1. Right knee pain, status post, Right knee arthroscopy with partial medial and  lateral meniscectomy.  unable to bear weight on the right leg, in a patient with morbid obesity and MRI evidence of both lateral and medial meniscal tear along with joint effusion - orthopedics and neurosurgery were consulted, continue pain control, further recommendations per orthopedics, for now patient is unable to stand up or  bear weight at all on the right leg, She has been seen by Dr. Valentina Gu on 08-2012 who performed surgery on 09/01/2012 of note patient is on Lovenox for DVT prophylaxis however it is weight-based, she will continue with this for another 2 weeks. Also seen by Dr. Lovell Sheehan neurosurgeon as she has history of L-spine problem, L-spine MRI and Dr. Lovell Sheehan evaluation not consistent with any ongoing L-spine issues.  After the patient's right knee arthroscopy the patient continued to have pain. She had an MRI of the right leg that showed septic arthritis with dissecting abscess up along the anterior and lateral aspect of the  femur with severe surrounding myositis extending all way up to the  hip. On 09/04/12 the patient had an ultrasound guided aspiration done by interventional radiology. This fluid culture grew out MRSA. She subsequently underwent   incision and drainage of the right thigh on 12/10. She was diagnosed with right thigh abscess with MRSA and infectious disease has recommended vancomycin for a total of 6 weeks. Her PICC line has been placed for the same. The abscess fluid was mislabeled as synovial fluid. She had a  repeat knee aspiration done today by interventional radiology on 09/10/12.  the specimen fluid is clotted so an absolute WBC count could not be obtained ,culture is pending . I have discussed with Dr Algis Liming, since the study is inconclusive and patient is not interested in another tap , she will  be discharged home with  6 weeks of IV vancomycin. She will followup with infectious disease in 2 weeks   Studies/Results:  09/04/12 12:46PM Fluoro-guided aspiration of right knee: Findings: The knee was localized under fluoroscopy. A 20 gauge  spinal needle was then advanced into the joint. Position was  confirmed by injecting contrast material. A single drop of normal  appearing joint fluid was aspirated. 7 ml of saline were then  injected into the joint and subsequent ratio was attempted but  failed.  The patient tolerated the procedure well. There was no  immediate post procedural complication. Specimen obtained was sent  to the laboratory.   09/04/12 14:48 US aspiration of right thigh: IMPRESSION:  1. Technically successful ultrasound guided aspiration of a  complex fluid collection adjacent to the right femur.  Approximately 35 ml of purulent sanguinous fluid was aspirated.  2. The fluid was sent for cell count, Gram stain and culture.       Physical therapy recommended SNF however the patient wants to go home with home health physical therapy as well as a rolling walker and bedside commode. Husband wants to do the same. Patient to followup with Dr. Valentina Gu in  10 days   2. Diabetes mellitus type 2 morbid obesity - during this hospitalization the patient was treated with pre-meal NovoLog along with sliding scale insulin, she will resume her home medications CBG (last 3)  3. History of hypertension. Continue ARB, monitor blood pressure.  4. Hypothyroidism. Continue home dose Synthroid.  5. Dyslipidemia continue home dose statin.  6. Morbid obesity. Outpatient follow with PCP and dietician.  7.anemia hemoglobin has slowly trended down to 7.8 she, she will be started on a multivitamin this is likely anemia of chronic disease, asymptomatic therefore no transfusion indicated at this time, discontinue Lovenox for DVT prophylaxis , hemoglobin has been trending down in the outpatient setting. Patient would benefit from a full GI workup to be arranged for by the primary care provider   8. Pain control patient on MS Contin, high-dose OxyIR every 4 hours, patient has been advised not to drive for at least a month  9. hyponatremia patient has been placed on fluid restriction and a dose of Lasix has been decreased she would need a repeat BMP in one week. Sodium improved to 135, from 128.         Discharge Exam:  Blood pressure 134/56, pulse 74, temperature 97.8 F (36.6 C), temperature source  Oral, resp. rate 16, height 5\' 6"  (1.676 m), weight 152.3 kg (335 lb 12.2 oz), SpO2 93.00%.  General appearance: alert, cooperative and mild distress  Resp: clear to auscultation bilaterally  Cardio: regular rate and rhythm, S1, S2 normal, no murmur, click, rub or gallop  GI: soft, non-tender; bowel sounds normal; no masses, no organomegaly  Extremities: Right leg is wrapped from proximal thigh to foot, dressing dry and intact      Followup discharge instructions  #1 BMP and CBC in one week  #2 no driving for at least a month because her multiple pain medications      Discharge Orders    Future Orders Please Complete By Expires   Diet - low sodium heart healthy      Increase activity slowly         Follow-up Information    Follow up with Raymon Mutton, MD. Schedule an appointment as soon as possible for a visit on 09/16/2012.   Contact information:   [address] [employer]  [place] [address] [phone]       Follow up with [name], MD. Schedule an appointment as soon as possible for a visit in 1 week.   Contact information:   [address] [employer] [address] [place] [address] [phone]          Signed: [name] 09/10/2012, 9:55 AM

## 2012-09-10 NOTE — Progress Notes (Signed)
Encountered husband in hall and per request visited w patient in her room  C/o pain Afebrile RLE little change in exam w pain on attempted motion; no external sign of infection otherwise  Last CRP 24 on 12/8 and ESR of 140 on 12/7  Plan: Dr Susie Cassette has ordered interventional to tap knee with flouro confirmation of intra-articular location to confirm within the joint I added CRP to ESR for trend with ongoing infection treatment Patient remains under the orthopaedic care of Dr. Sherlean Foot with WBAT and ROM as tolerated; his concern re returning home seems well founded as unlikely to mobilize without major assistance and improved pain control.  Myrene Galas, MD [facility] [phone] (p)

## 2012-09-10 NOTE — Progress Notes (Signed)
PT Cancellation Note  Patient Details Name: Amy Barnes MRN: [id number] DOB: March 25, 1951   Cancelled Treatment:     Patient going down for I&D this morning. Will attempt seeing patient this afternoon to prep for possible DC home when medically stable as patient and husband do not want SNF   Robinette, Adline Potter 09/10/2012, 10:52 AM 09/10/2012 Fredrich Birks PTA [phone] pager [phone] office

## 2012-09-10 NOTE — Progress Notes (Signed)
Utilization review completed. Euphemia Lingerfelt, RN, BSN. 

## 2012-09-11 LAB — CBC
HCT: 23.2 % — ABNORMAL LOW (ref 36.0–46.0)
Hemoglobin: 7.6 g/dL — ABNORMAL LOW (ref 12.0–15.0)
MCH: 27 pg (ref 26.0–34.0)
MCHC: 32.8 g/dL (ref 30.0–36.0)
MCV: 82.3 fL (ref 78.0–100.0)
Platelets: 331 10*3/uL (ref 150–400)
RBC: 2.82 MIL/uL — ABNORMAL LOW (ref 3.87–5.11)
RDW: 15.1 % (ref 11.5–15.5)
WBC: 5.3 10*3/uL (ref 4.0–10.5)

## 2012-09-11 LAB — C-REACTIVE PROTEIN: CRP: 17.3 mg/dL — ABNORMAL HIGH (ref <0.60)

## 2012-09-11 LAB — CULTURE, BLOOD (ROUTINE X 2)
Culture: NO GROWTH
Culture: NO GROWTH

## 2012-09-11 LAB — GLUCOSE, CAPILLARY
Glucose-Capillary: 117 mg/dL — ABNORMAL HIGH (ref 70–99)
Glucose-Capillary: 133 mg/dL — ABNORMAL HIGH (ref 70–99)

## 2012-09-11 LAB — ANAEROBIC CULTURE

## 2012-09-11 MED ORDER — VANCOMYCIN HCL 10 G IV SOLR: 1500.0000 mg | Freq: Two times a day (BID) | INTRAVENOUS | Status: DC

## 2012-09-11 MED ORDER — HEPARIN SOD (PORK) LOCK FLUSH 100 UNIT/ML IV SOLN
250.0000 [IU] | INTRAVENOUS | Status: AC | PRN
  Administered 2012-09-11: 250 [IU]

## 2012-09-11 NOTE — Progress Notes (Signed)
[facility]                                                                                Patient Demographics  Ridhi Hoffert, is a 61 y.o. female  ZOX:[id number]  [id number]  DOB - 1951-01-26  Admit date - 08/28/2012  Admitting Physician Lars Mage, MD  Outpatient Primary MD for the patient is [name], MD  LOS - 14   Chief Complaint  Patient presents with  . Knee Pain        Assessment & Plan    Patient seen briefly today due for discharge soon per Discharge done yesterday by Dr Susie Cassette, no further issues, Vital signs stable, patient feels fine.      Medications  Scheduled Meds:   . docusate sodium  300 mg Oral QHS  . insulin aspart  0-15 Units Subcutaneous TID WC  . insulin aspart  0-5 Units Subcutaneous QHS  . insulin aspart  4 Units Subcutaneous TID WC  . ketorolac  30 mg Intravenous Q8H  . lamoTRIgine  200 mg Oral Daily  . levothyroxine  150 mcg Oral QAC breakfast  . midazolam  2 mg Intravenous Once  . montelukast  10 mg Oral QHS  . morphine  30 mg Oral Q12H  . Oxcarbazepine  300 mg Oral Daily  . OXcarbazepine  600 mg Oral QHS  . simvastatin  40 mg Oral QPM  . sodium chloride  10-40 mL Intracatheter Q12H  . sodium chloride  3 mL Intravenous Q12H  . vancomycin  1,500 mg Intravenous Q12H   Continuous Infusions:  PRN Meds:.sodium chloride, acetaminophen, acetaminophen, albuterol, ALPRAZolam, bisacodyl, cyclobenzaprine, menthol-cetylpyridinium, metoCLOPramide (REGLAN) injection, metoCLOPramide, ondansetron (ZOFRAN) IV, ondansetron, oxyCODONE, phenol, promethazine, sodium chloride, sodium chloride, sodium chloride    Time Spent in minutes   10 minutes   Susa Raring K M.D on 09/11/2012 at 8:14 AM  Between 7am to 7pm - Pager - [phone]  After 7pm go to [email or web address] - password [email or web address]  And look for the night coverage person covering for me after hours  [facility] Office  [phone]    Subjective:   Shaakira Borrero today has, No headache, No chest pain, No abdominal pain - No Nausea, No new weakness tingling or numbness, No Cough - SOB.    Objective:   Filed Vitals:   09/09/12 2127 09/10/12 0529 09/10/12 1613 09/11/12 0600  BP: 155/80 134/56 143/69 127/59  Pulse: 95 74 87 81  Temp: 98.7 F (37.1 C) 97.8 F (36.6 C) 98.4 F (36.9 C) 98.4 F (36.9 C)  TempSrc: Oral Oral  Oral  Resp: 18 16 18 18   Height:      Weight:      SpO2: 98% 93% 100% 97%    Wt Readings from Last 3 Encounters:  08/29/12 152.3 kg (335 lb 12.2 oz)  08/29/12 152.3 kg (335 lb 12.2 oz)  08/29/12 152.3 kg (335 lb 12.2 oz)     Intake/Output Summary (Last 24 hours) at 09/11/12 0814 Last data filed at 09/10/12 1700  Gross per 24 hour  Intake    480 ml  Output      0 ml  Net  480 ml    Exam Awake Alert, Oriented X 3, No new F.N deficits, Normal affect [place].AT,PERRAL Supple Neck,No JVD, No cervical lymphadenopathy appriciated.  Symmetrical Chest wall movement, Good air movement bilaterally, CTAB RRR,No Gallops,Rubs or new Murmurs, No Parasternal Heave +ve B.Sounds, Abd Soft, Non tender, No organomegaly appriciated, No rebound - guarding or rigidity. No Cyanosis, Clubbing or edema, No new Rash or bruise , Rt thigh and Knee under bandage, Rt arm PICC   Data Review

## 2012-09-11 NOTE — Progress Notes (Signed)
D/C instructions reviewed with patient and husband. RX x 5 given. All questions answered. hh equipment at home and at the bedside. [facility] with [facility]. Pt d/c'ed in stable condition via [facility]. Husband present at time of transfer. All belongings sent with patient.

## 2012-09-14 LAB — BODY FLUID CULTURE
Culture: NO GROWTH
Gram Stain: NONE SEEN

## 2012-09-16 ENCOUNTER — Encounter: Payer: Self-pay | Admitting: Family Medicine

## 2012-09-16 ENCOUNTER — Ambulatory Visit (INDEPENDENT_AMBULATORY_CARE_PROVIDER_SITE_OTHER): Payer: BC Managed Care – PPO | Admitting: Family Medicine

## 2012-09-16 VITALS — BP 128/66 | HR 87 | Temp 98.2°F

## 2012-09-16 DIAGNOSIS — E1149 Type 2 diabetes mellitus with other diabetic neurological complication: Secondary | ICD-10-CM

## 2012-09-16 DIAGNOSIS — E1142 Type 2 diabetes mellitus with diabetic polyneuropathy: Secondary | ICD-10-CM

## 2012-09-16 DIAGNOSIS — M79606 Pain in leg, unspecified: Secondary | ICD-10-CM

## 2012-09-16 DIAGNOSIS — M79609 Pain in unspecified limb: Secondary | ICD-10-CM

## 2012-09-16 DIAGNOSIS — D649 Anemia, unspecified: Secondary | ICD-10-CM

## 2012-09-16 MED ORDER — MORPHINE SULFATE ER 30 MG PO TBCR: 30.0000 mg | EXTENDED_RELEASE_TABLET | Freq: Two times a day (BID) | ORAL | Status: DC

## 2012-09-16 MED ORDER — OXYCODONE HCL 10 MG PO TABS: 10.0000 mg | ORAL_TABLET | ORAL | Status: DC | PRN

## 2012-09-16 NOTE — Progress Notes (Signed)
Subjective:    Patient ID: Amy Barnes, female    DOB: [birth year], 61 y.o.   MRN: [id number]  HPI  Admitted 11/30-12/13  HOSPITAL COURSE:  1. Right knee pain, status post, Right knee arthroscopy with partial medial and  lateral meniscectomy. Unable to bear weight on the right leg, in a patient with morbid obesity and MRI evidence of both lateral and medial meniscal tear along with joint effusion - orthopedics and neurosurgery were consulted, continue pain control, further recommendations per orthopedics, for now patient is unable to stand up or bear weight at all on the right leg, She has been seen by Dr. Valentina Gu on 08-2012 who performed surgery on 09/01/2012 of note patient is on Lovenox for DVT prophylaxis however it is weight-based, she will continue with this for another 2 weeks. Also seen by Dr. Lovell Sheehan neurosurgeon as she has history of L-spine problem, L-spine MRI and Dr. Lovell Sheehan evaluation not consistent with any ongoing L-spine issues.  After the patient's right knee arthroscopy the patient continued to have pain. She had an MRI of the right leg that showed septic arthritis with dissecting abscess up along the anterior and lateral aspect of the  femur with severe surrounding myositis extending all way up to the  hip. On 09/04/12 the patient had an ultrasound guided aspiration done by interventional radiology. This fluid culture grew out MRSA. She subsequently underwent incision and drainage of the right thigh on 12/10. She was diagnosed with right thigh abscess with MRSA and infectious disease has recommended vancomycin for a total of 6 weeks. Her PICC line has been placed for the same. The abscess fluid was mislabeled as synovial fluid. She had a repeat knee aspiration done  by interventional radiology on 09/10/12. the specimen fluid is clotted so an absolute WBC count could not be obtained ,culture is pending . Discussed with Dr Algis Liming ID, since the study is inconclusive and patient is not  interested in another tap , she will be discharged home with 6 weeks of IV vancomycin.   Follow up shoulder be pending next week (Dr. Algis Liming).. Awaiting appt.   Has follow up with Ortho Dr. Albin Felling on Tues. Pain remains severe.. On morphine 3 times a day, oxycodone for breakthrough. Flexreril every eight hours.. cramping in left leg. MRI of while leg showed no issues lower down in leg. Having home PT. Has not been moving around much given pain.  MRI back and hip films showed no cause of pain from these areas.  On lovenox for DVT prophylaxisis  DM, well controlleld in hospital and at home. FBS 108-124.  Hg was 7.6 on discharge. She had transfusion in hospital on 12/10 Hg .Marland Kitchen Went up to 8.0.  Fatigued. Lightheaded with standing.  Taking iron OTC daily (65 mg)  BP at home 128/66.     Review of Systems See HPI, no other pertinant positives.    Objective:   Physical Exam  Vitals reviewed. Constitutional: Vital signs are normal. She appears well-developed and well-nourished. She is cooperative.  Non-toxic appearance. She does not appear ill. No distress.       Morbidly obese..the patient tearful and in pain with any palpation of leg and any movement  HENT:  Head: Normocephalic.  Right Ear: Hearing, tympanic membrane, external ear and ear canal normal. Tympanic membrane is not erythematous, not retracted and not bulging.  Left Ear: Hearing, tympanic membrane, external ear and ear canal normal. Tympanic membrane is not erythematous, not retracted and not bulging.  Nose:  No mucosal edema or rhinorrhea. Right sinus exhibits no maxillary sinus tenderness and no frontal sinus tenderness. Left sinus exhibits no maxillary sinus tenderness and no frontal sinus tenderness.  Mouth/Throat: Uvula is midline, oropharynx is clear and moist and mucous membranes are normal.  Eyes: Conjunctivae normal, EOM and lids are normal. Pupils are equal, round, and reactive to light. No foreign bodies found.   Neck: Trachea normal and normal range of motion. Neck supple. Carotid bruit is not present. No mass and no thyromegaly present.  Cardiovascular: Normal rate, regular rhythm, S1 normal, S2 normal, normal heart sounds, intact distal pulses and normal pulses.  Exam reveals no gallop and no friction rub.   No murmur heard.      B 2 plus peripheral edema  Pulmonary/Chest: Effort normal and breath sounds normal. Not tachypneic. No respiratory distress. She has no decreased breath sounds. She has no wheezes. She has no rhonchi. She has no rales.  Abdominal: Soft. Normal appearance and bowel sounds are normal. There is no tenderness.  Neurological: She is alert.  Skin: Skin is warm, dry and intact. No rash noted.  Psychiatric: Her speech is normal and behavior is normal. Judgment and thought content normal. Her mood appears not anxious. Cognition and memory are normal. She does not exhibit a depressed mood.          Assessment & Plan:

## 2012-09-16 NOTE — Patient Instructions (Addendum)
We will call with lab results.  Increase back to Morphine 15 mg 2 tab po twice daily. Try to use oxycodone as needed 10 mg every 4-6 hours. Do not use percocet. Follow up in 1-2 weeks. Keep other appts with ID and ORTHO.

## 2012-09-17 LAB — CBC WITH DIFFERENTIAL/PLATELET
Basophils Absolute: 0.1 10*3/uL (ref 0.0–0.1)
Basophils Relative: 0.7 % (ref 0.0–3.0)
Eosinophils Absolute: 0.3 10*3/uL (ref 0.0–0.7)
Eosinophils Relative: 2.8 % (ref 0.0–5.0)
HCT: 29.9 % — ABNORMAL LOW (ref 36.0–46.0)
Hemoglobin: 9.9 g/dL — ABNORMAL LOW (ref 12.0–15.0)
Lymphocytes Relative: 18.5 % (ref 12.0–46.0)
Lymphs Abs: 1.7 10*3/uL (ref 0.7–4.0)
MCHC: 33 g/dL (ref 30.0–36.0)
MCV: 81.7 fl (ref 78.0–100.0)
Monocytes Absolute: 0.8 10*3/uL (ref 0.1–1.0)
Monocytes Relative: 9 % (ref 3.0–12.0)
Neutro Abs: 6.3 10*3/uL (ref 1.4–7.7)
Neutrophils Relative %: 69 % (ref 43.0–77.0)
Platelets: 541 10*3/uL — ABNORMAL HIGH (ref 150.0–400.0)
RBC: 3.65 Mil/uL — ABNORMAL LOW (ref 3.87–5.11)
RDW: 15.2 % — ABNORMAL HIGH (ref 11.5–14.6)
WBC: 9.2 10*3/uL (ref 4.5–10.5)

## 2012-09-23 ENCOUNTER — Other Ambulatory Visit: Payer: Self-pay

## 2012-09-23 NOTE — Telephone Encounter (Signed)
pts husband left v/m pt d/c from hospital last week; pt has 2 shots left of enoxaparin(Lovenox) 80mg /0.8 ml. Does pt need to refill Lovenox? [facility].Please advise.

## 2012-09-24 ENCOUNTER — Encounter: Payer: Self-pay | Admitting: *Deleted

## 2012-09-24 NOTE — Telephone Encounter (Signed)
Spoke with dr lucey's office left message for them to refill lovenox if needed..  Patients family advised

## 2012-09-25 ENCOUNTER — Inpatient Hospital Stay: Payer: Self-pay | Admitting: Internal Medicine

## 2012-09-25 LAB — COMPREHENSIVE METABOLIC PANEL
Albumin: 3.3 g/dL — ABNORMAL LOW (ref 3.4–5.0)
Alkaline Phosphatase: 130 U/L (ref 50–136)
Anion Gap: 11 (ref 7–16)
BUN: 8 mg/dL (ref 7–18)
Bilirubin,Total: 0.4 mg/dL (ref 0.2–1.0)
Calcium, Total: 9.5 mg/dL (ref 8.5–10.1)
Chloride: 104 mmol/L (ref 98–107)
Co2: 22 mmol/L (ref 21–32)
Creatinine: 0.75 mg/dL (ref 0.60–1.30)
EGFR (African American): >60
EGFR (Non-African Amer.): >60
Glucose: 167 mg/dL — ABNORMAL HIGH (ref 65–99)
Osmolality: 276 (ref 275–301)
Potassium: 3.6 mmol/L (ref 3.5–5.1)
SGOT(AST): 20 U/L (ref 15–37)
SGPT (ALT): 17 U/L (ref 12–78)
Sodium: 137 mmol/L (ref 136–145)
Total Protein: 9.1 g/dL — ABNORMAL HIGH (ref 6.4–8.2)

## 2012-09-25 LAB — TROPONIN I: Troponin-I: <0.02 ng/mL

## 2012-09-25 LAB — URINALYSIS, COMPLETE
Bacteria: NONE SEEN
Bilirubin,UR: NEGATIVE
Blood: NEGATIVE
Glucose,UR: NEGATIVE mg/dL (ref 0–75)
Leukocyte Esterase: NEGATIVE
Nitrite: NEGATIVE
Ph: 8 (ref 4.5–8.0)
Protein: NEGATIVE
RBC,UR: 1 /HPF (ref 0–5)
Specific Gravity: 1.017 (ref 1.003–1.030)
Squamous Epithelial: <1
WBC UR: 3 /HPF (ref 0–5)

## 2012-09-25 LAB — CBC
HCT: 35.9 % (ref 35.0–47.0)
HGB: 11.8 g/dL — ABNORMAL LOW (ref 12.0–16.0)
MCH: 26.8 pg (ref 26.0–34.0)
MCHC: 32.8 g/dL (ref 32.0–36.0)
MCV: 82 fL (ref 80–100)
Platelet: 404 10*3/uL (ref 150–440)
RBC: 4.39 10*6/uL (ref 3.80–5.20)
RDW: 16.8 % — ABNORMAL HIGH (ref 11.5–14.5)
WBC: 8.9 10*3/uL (ref 3.6–11.0)

## 2012-09-25 LAB — LIPASE, BLOOD: Lipase: 102 U/L (ref 73–393)

## 2012-09-25 LAB — MAGNESIUM: Magnesium: 1.6 mg/dL — ABNORMAL LOW

## 2012-09-26 LAB — VANCOMYCIN, TROUGH: Vancomycin, Trough: 11 ug/mL (ref 10–20)

## 2012-09-26 LAB — TSH: Thyroid Stimulating Horm: 0.482 u[IU]/mL

## 2012-09-28 LAB — CREATININE, SERUM
Creatinine: 0.66 mg/dL (ref 0.60–1.30)
EGFR (African American): >60
EGFR (Non-African Amer.): >60

## 2012-09-28 LAB — OCCULT BLOOD X 1 CARD TO LAB, STOOL: Occult Blood, Feces: NEGATIVE

## 2012-09-28 LAB — VANCOMYCIN, TROUGH: Vancomycin, Trough: 19 ug/mL (ref 10–20)

## 2012-09-30 LAB — PATHOLOGY REPORT

## 2012-10-04 ENCOUNTER — Encounter: Payer: Self-pay | Admitting: Infectious Disease

## 2012-10-05 LAB — FUNGUS CULTURE W SMEAR: Fungal Smear: NONE SEEN

## 2012-10-11 ENCOUNTER — Encounter: Payer: Self-pay | Admitting: Infectious Disease

## 2012-10-11 ENCOUNTER — Ambulatory Visit (INDEPENDENT_AMBULATORY_CARE_PROVIDER_SITE_OTHER): Payer: BC Managed Care – PPO | Admitting: Infectious Disease

## 2012-10-11 VITALS — BP 160/88 | HR 105 | Temp 97.4°F | Ht 64.0 in | Wt 320.0 lb

## 2012-10-11 DIAGNOSIS — M25512 Pain in left shoulder: Secondary | ICD-10-CM | POA: Insufficient documentation

## 2012-10-11 DIAGNOSIS — M009 Pyogenic arthritis, unspecified: Secondary | ICD-10-CM

## 2012-10-11 DIAGNOSIS — M25519 Pain in unspecified shoulder: Secondary | ICD-10-CM

## 2012-10-11 DIAGNOSIS — A4902 Methicillin resistant Staphylococcus aureus infection, unspecified site: Secondary | ICD-10-CM | POA: Insufficient documentation

## 2012-10-11 HISTORY — DX: Pyogenic arthritis, unspecified: M00.9

## 2012-10-11 LAB — SEDIMENTATION RATE: Sed Rate: 60 mm/hr — ABNORMAL HIGH (ref 0–22)

## 2012-10-11 NOTE — Progress Notes (Signed)
Subjective:    Patient ID: Amy Barnes, female    DOB: 1951-02-11, 62 y.o.   MRN: [id number]  HPI  62 y.o. female with a past medical history of HTN, DM type 2, hyperlipidemia, obesity, hypothyroidism, bipolar disorder, and multiple orthopedic surgeries who now presents s/p meniscotomy on 09/01/12 and I&D of right thigh abscess with MRSA on 09/06/12. There had been attempts to aspirate the knee joint itself as an MRI had suggested infection of the knee joint in addition to the abscess in the thigh. The first time fluid was collected from the knee was apparently lost. There was fluid instead drawn from the thigh abscess and this was mistakenly labeled as synovial fluid in the computer. Second attempt was made to draw fluid from the knee joint and this specimen clotted not even a cell count and differential could be performed although there were no crystals seen on analysis. We decided to the patient presumptively in terms of duration with IV vancomycin for 6 weeks and she is continued on this. Her thigh pain is greatly improved although she has had persistent and bothersome knee pain that makes excruciating painful for her to stand and bear weight in particular. She also developed worsening shoulder pain which he attributes to use of her walker and she has exquisite tenderness on palpation of her left shoulder today. She has been without fevers or chills but does have continued malaise.I spent greater than 45 minutes with the patient including greater than 50% of time in face to face counsel of the patient and in coordination of their care.    Review of Systems  Constitutional: Negative for fever, chills, diaphoresis, activity change, appetite change, fatigue and unexpected weight change.  HENT: Negative for congestion, sore throat, rhinorrhea, sneezing, trouble swallowing and sinus pressure.   Eyes: Negative for photophobia and visual disturbance.  Respiratory: Negative for cough, chest tightness,  shortness of breath, wheezing and stridor.   Cardiovascular: Negative for chest pain, palpitations and leg swelling.  Gastrointestinal: Negative for nausea, vomiting, abdominal pain, diarrhea, constipation, blood in stool, abdominal distention and anal bleeding.  Genitourinary: Negative for dysuria, hematuria, flank pain and difficulty urinating.  Musculoskeletal: Positive for myalgias, joint swelling, arthralgias and gait problem. Negative for back pain.  Skin: Negative for color change, pallor, rash and wound.  Neurological: Negative for dizziness, tremors, weakness and light-headedness.  Hematological: Negative for adenopathy. Does not bruise/bleed easily.  Psychiatric/Behavioral: Positive for dysphoric mood. Negative for behavioral problems, confusion, sleep disturbance, decreased concentration and agitation.       Objective:   Physical Exam  Constitutional: She is oriented to person, place, and time. She appears well-developed and well-nourished. No distress.  HENT:  Head: Normocephalic and atraumatic.  Mouth/Throat: Oropharynx is clear and moist. No oropharyngeal exudate.  Eyes: Conjunctivae normal and EOM are normal. Pupils are equal, round, and reactive to light. No scleral icterus.  Neck: Normal range of motion. Neck supple. No JVD present.  Cardiovascular: Normal rate, regular rhythm and normal heart sounds.  Exam reveals no gallop and no friction rub.   No murmur heard. Pulmonary/Chest: Effort normal and breath sounds normal. No respiratory distress. She has no wheezes. She has no rales. She exhibits no tenderness.  Abdominal: She exhibits no distension and no mass. There is no tenderness. There is no rebound and no guarding.  Musculoskeletal: She exhibits no edema and no tenderness.       Left shoulder: She exhibits tenderness. She exhibits no swelling and no effusion.  Right knee: She exhibits decreased range of motion. She exhibits no erythema. tenderness found.    Lymphadenopathy:    She has no cervical adenopathy.  Neurological: She is alert and oriented to person, place, and time. She exhibits normal muscle tone. Coordination normal.  Skin: Skin is warm and dry. She is not diaphoretic.     Psychiatric: Her behavior is normal. Judgment and thought content normal.          Assessment & Plan:  MRSA thigh abscess: likely is resolved but knee pain is bothersome to me  Knee pain: I had been concerned for possiblity of septic knee based on MRI findings and now pts persistent pain here and inability to properly evaluate the joint fluid. That being said the joint apparently did not appear that in the operating room and the meniscectomy was performed. Additionally there was not much fluid that was at cable been removed by interventional radiology and they attempted the aspiration of joint fluid. I will check a sedimentation rate today and he recommended the patient followup with Dr. Valentina Gu. Her prior sedimentation rate was in the 140s to 120s.  Left shoulder pain: I will check an MRI of the left shoulder to look for possible pyomyositis and septic arthritis of this joint. We'll continue vancomycin until we have greater clarity regards to her shoulder and also her knee pain. Him in followup with her in 2 weeks' time.

## 2012-10-11 NOTE — Patient Instructions (Addendum)
We need MRI of the shoulder  We will check esr, crp today  I think you should be seen by Dr. Sherlean Foot again re your persistent knee pain  I want to continue the IV vancomycin in the meantime

## 2012-10-12 ENCOUNTER — Telehealth: Payer: Self-pay

## 2012-10-12 LAB — C-REACTIVE PROTEIN: CRP: 4.7 mg/dL — ABNORMAL HIGH (ref <0.60)

## 2012-10-12 NOTE — Telephone Encounter (Signed)
Fransico Michael OT with [facility]; request verbal orders to continue OT for once a week for 2 more weeks; start next week to work on advanced independent living skills due to husband returning to work.Please advise.

## 2012-10-12 NOTE — Telephone Encounter (Signed)
Agree. Give verbal orders to extend OT

## 2012-10-12 NOTE — Telephone Encounter (Signed)
Left message for nurse approving verbal order

## 2012-10-14 ENCOUNTER — Ambulatory Visit
Admission: RE | Admit: 2012-10-14 | Discharge: 2012-10-14 | Disposition: A | Discharge status: Home / Self Care | Payer: BC Managed Care – PPO | Source: Ambulatory Visit | Attending: Infectious Disease | Admitting: Infectious Disease

## 2012-10-14 DIAGNOSIS — M25512 Pain in left shoulder: Secondary | ICD-10-CM

## 2012-10-14 MED ORDER — GADOBENATE DIMEGLUMINE 529 MG/ML IV SOLN
20.0000 mL | Freq: Once | INTRAVENOUS | Status: AC | PRN
  Administered 2012-10-14: 20 mL via INTRAVENOUS

## 2012-10-18 ENCOUNTER — Telehealth: Payer: Self-pay | Admitting: *Deleted

## 2012-10-18 ENCOUNTER — Encounter: Payer: Self-pay | Admitting: Family Medicine

## 2012-10-18 NOTE — Telephone Encounter (Signed)
The patient sees Dr Sherlean Foot tomorrow morning. Advised Amy to call us back tomorrow with what Dr Sherlean Foot feels or we can maintain the PICC and wait until Monday 10/25/12. She advised she will call Dr Sherlean Foot office and have them do the labs there. And fax them to [place] asap.  Called the patient and advised her the MRI looked fine and we will make a decision in conjunction with Dr Sherlean Foot as to her PICC after her visit tomorrow and it will be communicated to her and her [facility] nurse. She was fine with this.

## 2012-10-18 NOTE — Telephone Encounter (Signed)
MRI looks fine. Did she see Lucey? If he is OK with her knee we can dc her abx and the pICC line and observer her off abx. I can also address this next Monday further if they are more comfortable with that approach

## 2012-10-18 NOTE — Telephone Encounter (Signed)
Amy with [facility] called and advised she was unable to draw the patients labs today as her PICC would not give any and her arms are in terrible shape. She advised the patient gets her last dose of IV meds on 10/22/12 and she goes to the doctor tomorrow and wanted to know if we intend to extend her PICC and if so she needs a verbal for Cathflow. She advised she she also needs a verbal to D/C her PICC on 10/22/12. Advised her not sure if Dr Daiva Eves will want to see the patient sooner or have [facility] go back out there for labs as his decision is usually tied to the results of those labs and he is not in the office so will have to send him a message and call her back once he responds. She left cell [phone]  Amy as her contact.  As soon as I ended the call the patient called and advised [facility] was unable to draw her labs. She also wanted to get the results of her MRI from 10/11/12 as well as her lab results. Advised her will message the doctor and call her back as soon as I get a response.

## 2012-10-20 NOTE — Assessment & Plan Note (Signed)
Poor pain control. Increase back to Morphine 15 mg 2 tab po twice daily. Try to use oxycodone as needed 10 mg every 4-6 hours. Do not use percocet. Follow up in 1-2 weeks.

## 2012-10-20 NOTE — Assessment & Plan Note (Signed)
Well controlled during hospitalization.

## 2012-10-20 NOTE — Telephone Encounter (Addendum)
Patient called and advised that Dr Sherlean Foot advised he is not comfortable with making a decision on her antibiotic and she will have to wait to see Dr Daiva Eves. The patient is fine with that she also asked if her PICC is D/C on Monday can we remove it in the office, advised her yes she is concerned as everytime [facility] comes to her house it cost her $150 out of pocket. Advised her to keep flushing the line daily until her visit and she and the doctor can discuss any concerns she has or if there will be an extension. She also advised that she still has not had labs drawn this week.

## 2012-10-20 NOTE — Assessment & Plan Note (Addendum)
Likely contributing to fatigue and ill feeling. Reeval cbc.. Likely will need to start iron

## 2012-10-25 ENCOUNTER — Ambulatory Visit (INDEPENDENT_AMBULATORY_CARE_PROVIDER_SITE_OTHER): Payer: BC Managed Care – PPO | Admitting: Infectious Disease

## 2012-10-25 VITALS — BP 160/94 | HR 105 | Temp 97.8°F | Wt 314.0 lb

## 2012-10-25 DIAGNOSIS — L02419 Cutaneous abscess of limb, unspecified: Secondary | ICD-10-CM

## 2012-10-25 DIAGNOSIS — M25519 Pain in unspecified shoulder: Secondary | ICD-10-CM

## 2012-10-25 DIAGNOSIS — M009 Pyogenic arthritis, unspecified: Secondary | ICD-10-CM

## 2012-10-25 DIAGNOSIS — IMO0002 Reserved for concepts with insufficient information to code with codable children: Secondary | ICD-10-CM

## 2012-10-25 DIAGNOSIS — S83209A Unspecified tear of unspecified meniscus, current injury, unspecified knee, initial encounter: Secondary | ICD-10-CM

## 2012-10-25 DIAGNOSIS — A4902 Methicillin resistant Staphylococcus aureus infection, unspecified site: Secondary | ICD-10-CM

## 2012-10-25 NOTE — Progress Notes (Signed)
Subjective:    Patient ID: Amy Barnes, female    DOB: 06-01-1951, 62 y.o.   MRN: [id number]  HPI  62 y.o. female with a past medical history of HTN, DM type 2, hyperlipidemia, obesity, hypothyroidism, bipolar disorder, and multiple orthopedic surgeries who now presents s/p meniscotomy on 09/01/12 and I&D of right thigh abscess with MRSA on 09/06/12. There had been attempts to aspirate the knee joint itself as an MRI had suggested infection of the knee joint in addition to the abscess in the thigh. The first time fluid was collected from the knee was apparently lost. There was fluid instead drawn from the thigh abscess and this was mistakenly labeled as synovial fluid in the computer. Second attempt was made to draw fluid from the knee joint and this specimen clotted not even a cell count and differential could be performed although there were no crystals seen on analysis. We decided to the patient presumptively in terms of duration with IV vancomycin for 6 weeks and she is continued on this. Her thigh pain had greatly improved although she has had persistent and bothersome knee pain that made excruciating painful for her to stand and bear weight in particular. She also developed worsening shoulder pain. We checked ESR which was reduced to 60. MRI of shoulder did not show ANY evidence of infection. Knee pain improved and Dr. Sherlean Foot has released her from his clinic. I will dc PICC line today and she can be observed OFF abx.    Review of Systems  Constitutional: Negative for fever, chills, diaphoresis, activity change, appetite change, fatigue and unexpected weight change.  HENT: Negative for congestion, sore throat, rhinorrhea, sneezing, trouble swallowing and sinus pressure.   Eyes: Negative for photophobia and visual disturbance.  Respiratory: Negative for cough, chest tightness, shortness of breath, wheezing and stridor.   Cardiovascular: Negative for chest pain, palpitations and leg swelling.    Gastrointestinal: Negative for nausea, vomiting, abdominal pain, diarrhea, constipation, blood in stool, abdominal distention and anal bleeding.  Genitourinary: Negative for dysuria, hematuria, flank pain and difficulty urinating.  Musculoskeletal: Positive for arthralgias and gait problem. Negative for myalgias, back pain and joint swelling.  Skin: Negative for color change, pallor, rash and wound.  Neurological: Negative for dizziness, tremors, weakness and light-headedness.  Hematological: Negative for adenopathy. Does not bruise/bleed easily.  Psychiatric/Behavioral: Negative for behavioral problems, confusion, sleep disturbance, dysphoric mood, decreased concentration and agitation.       Objective:   Physical Exam  Constitutional: She is oriented to person, place, and time. She appears well-developed and well-nourished. No distress.  HENT:  Head: Normocephalic and atraumatic.  Mouth/Throat: Oropharynx is clear and moist.  Eyes: Conjunctivae normal and EOM are normal. No scleral icterus.  Neck: Normal range of motion. Neck supple. JVD present.  Cardiovascular: Normal rate, regular rhythm and normal heart sounds.   Pulmonary/Chest: Effort normal. No respiratory distress.  Abdominal: She exhibits no distension.  Musculoskeletal: She exhibits no edema and no tenderness.       Legs: Lymphadenopathy:    She has no cervical adenopathy.  Neurological: She is alert and oriented to person, place, and time. Coordination normal.  Skin: Skin is warm and dry. She is not diaphoretic. No erythema. No pallor.     Psychiatric: She has a normal mood and affect. Her behavior is normal. Judgment and thought content normal.          Assessment & Plan:  MRSA thigh abscess: sp I and D and protracted IV abx,  resolved  ? Septic arthritis: never proven but given dutation c.w this dx. She appears to be doing well, DC abx and observe off abx. She can fu in [place]  Shoulder pain: no evidence of  infection

## 2012-10-26 ENCOUNTER — Telehealth: Payer: Self-pay | Admitting: Licensed Clinical Social Worker

## 2012-10-26 NOTE — Telephone Encounter (Signed)
RN noticed skin tissue in the picc line after it was pulled yesterday, I called the patient to see how she was feeling and if she was having any pain in her arm. She denied pain, she said it was a little tender. I let her know that Dr. Daiva Eves would like her to have a doppler to rule out dvt, she did not have transportation. I advised her to watch her arm and call us if she has increased pain or redness.

## 2012-10-26 NOTE — Addendum Note (Signed)
Addended by: Laurell Josephs on: 10/26/2012 12:28 PM   Modules accepted: Orders

## 2012-11-01 ENCOUNTER — Encounter: Payer: Self-pay | Admitting: Infectious Diseases

## 2012-11-01 ENCOUNTER — Ambulatory Visit (HOSPITAL_COMMUNITY)
Admission: RE | Admit: 2012-11-01 | Discharge: 2012-11-01 | Disposition: A | Discharge status: Home / Self Care | Payer: BC Managed Care – PPO | Source: Ambulatory Visit | Attending: Infectious Diseases | Admitting: Infectious Diseases

## 2012-11-01 ENCOUNTER — Telehealth: Payer: Self-pay | Admitting: Licensed Clinical Social Worker

## 2012-11-01 ENCOUNTER — Ambulatory Visit (INDEPENDENT_AMBULATORY_CARE_PROVIDER_SITE_OTHER): Payer: BC Managed Care – PPO | Admitting: Infectious Diseases

## 2012-11-01 VITALS — BP 146/85 | HR 109 | Temp 97.9°F | Ht 64.0 in | Wt 314.0 lb

## 2012-11-01 DIAGNOSIS — M79606 Pain in leg, unspecified: Secondary | ICD-10-CM

## 2012-11-01 DIAGNOSIS — M79609 Pain in unspecified limb: Secondary | ICD-10-CM | POA: Insufficient documentation

## 2012-11-01 LAB — COMPREHENSIVE METABOLIC PANEL
ALT: 12 U/L (ref 0–35)
AST: 14 U/L (ref 0–37)
Albumin: 4.3 g/dL (ref 3.5–5.2)
Alkaline Phosphatase: 96 U/L (ref 39–117)
BUN: 13 mg/dL (ref 6–23)
CO2: 25 mEq/L (ref 19–32)
Calcium: 10 mg/dL (ref 8.4–10.5)
Chloride: 103 mEq/L (ref 96–112)
Creat: 0.74 mg/dL (ref 0.50–1.10)
Glucose, Bld: 188 mg/dL — ABNORMAL HIGH (ref 70–99)
Potassium: 4.2 mEq/L (ref 3.5–5.3)
Sodium: 139 mEq/L (ref 135–145)
Total Bilirubin: 0.2 mg/dL — ABNORMAL LOW (ref 0.3–1.2)
Total Protein: 7.3 g/dL (ref 6.0–8.3)

## 2012-11-01 LAB — CBC WITH DIFFERENTIAL/PLATELET
Basophils Absolute: 0 10*3/uL (ref 0.0–0.1)
Basophils Relative: 0 % (ref 0–1)
Eosinophils Absolute: 0.4 10*3/uL (ref 0.0–0.7)
Eosinophils Relative: 5 % (ref 0–5)
HCT: 35.5 % — ABNORMAL LOW (ref 36.0–46.0)
Hemoglobin: 11.7 g/dL — ABNORMAL LOW (ref 12.0–15.0)
Lymphocytes Relative: 29 % (ref 12–46)
Lymphs Abs: 2.2 10*3/uL (ref 0.7–4.0)
MCH: 26.5 pg (ref 26.0–34.0)
MCHC: 33 g/dL (ref 30.0–36.0)
MCV: 80.5 fL (ref 78.0–100.0)
Monocytes Absolute: 0.7 10*3/uL (ref 0.1–1.0)
Monocytes Relative: 9 % (ref 3–12)
Neutro Abs: 4.4 10*3/uL (ref 1.7–7.7)
Neutrophils Relative %: 57 % (ref 43–77)
Platelets: 314 10*3/uL (ref 150–400)
RBC: 4.41 MIL/uL (ref 3.87–5.11)
RDW: 17.6 % — ABNORMAL HIGH (ref 11.5–15.5)
WBC: 7.7 10*3/uL (ref 4.0–10.5)

## 2012-11-01 NOTE — Telephone Encounter (Signed)
I scheduled her today to see Dr. Ninetta Lights

## 2012-11-01 NOTE — Progress Notes (Signed)
Right:  No evidence of DVT, superficial thrombosis, or Baker's cyst.  Left:  Negative for DVT in the common femoral vein.  Technically difficult study due to the patient's body habitus.

## 2012-11-01 NOTE — Progress Notes (Signed)
  Subjective:    Patient ID: Amy Barnes, female    DOB: [birth year], 62 y.o.   MRN: [id number]  HPI 61 HTN, DM type 2, hyperlipidemia, obesity, hypothyroidism, bipolar disorder, and multiple orthopedic surgeries,  s/p meniscotomy on 09/01/12 and I&D of right thigh abscess with MRSA on 09/06/12. Attempts to aspirate the knee joint were unsuccessful (the fluid was lost on first attempt, second attempt the fluid clotted).  MRI had suggested infection of the knee joint in addition to the abscess in the thigh. She was treated with vancomycin for 6 weeks. She had f/u 1-27 and had been doing well, had been walking.  Over last 24 h has had increased pain, feels that this is similar to her abscess pain. No fever or chills. No change in the swelling in her R leg. Her leg is now extremely painful to wt bearing. She denies any trauma to her feet.  Was seen by MD on 1-31 and was started on doxy for sinus infection which she has been on since.   Review of Systems  Constitutional: Negative for fever and chills.       FSG was 110 this AM.   Musculoskeletal: Positive for myalgias and gait problem.       Objective:   Physical Exam  Constitutional:  Non-toxic appearance. She does not have a sickly appearance. She does not appear ill. She appears distressed.    Musculoskeletal:       Legs: Neurological: She is alert.          Assessment & Plan:

## 2012-11-01 NOTE — Telephone Encounter (Signed)
Patient's daughter called stating that the patient couldn't bare weight on her right leg, and she is in a lot of pain, this started yesterday and has not subsided. She was currently taken off antibiotics last week and picc was pulled.

## 2012-11-01 NOTE — Telephone Encounter (Signed)
Can anyone see her in ID clinic urgently?  She is also going to need to see Dr Lajoyce Corners

## 2012-11-01 NOTE — Assessment & Plan Note (Addendum)
The etiology of her pain is ? She is certainly at risk for DVT given her obesity. She is also at risk of failed therapy/re-infection. If her doppler is (-), will have her set up for MRI of her leg. Will get CBC, CMP, ESR (her daughter asks for lab work). After discussing with nursing, it is reveled that pt's morphine rx has recently been reduced from 30mg  bid to 15mg  bid. She did not ask for pain rx today.

## 2012-11-02 ENCOUNTER — Other Ambulatory Visit: Payer: Self-pay | Admitting: Infectious Diseases

## 2012-11-02 DIAGNOSIS — A4902 Methicillin resistant Staphylococcus aureus infection, unspecified site: Secondary | ICD-10-CM

## 2012-11-02 LAB — SEDIMENTATION RATE: Sed Rate: 30 mm/hr — ABNORMAL HIGH (ref 0–22)

## 2012-11-09 ENCOUNTER — Telehealth: Payer: Self-pay | Admitting: *Deleted

## 2012-11-09 NOTE — Telephone Encounter (Signed)
Called patient to find out when she wanted to have the MRI done and she refused the test. She said she does not feel it is necessary now and will call if she changes her mind. Amy Barnes

## 2013-07-30 HISTORY — PX: I & D EXTREMITY: SHX5045

## 2013-08-29 LAB — HM DIABETES EYE EXAM

## 2013-09-18 IMAGING — RF DG FLUORO GUIDE NDL PLC/BX
5 series · 5 of 5 positions shown · non-contrast
Comparison: 09/04/2012

CLINICAL DATA: Evaluate for septic arthritis.  Right thigh soft
tissue abscess.

FLUORO GUIDED NEEDLE PLACEMENT

[Series 1: run · 1 of 1 slices shown (1 of 5)]
[im 1/1]
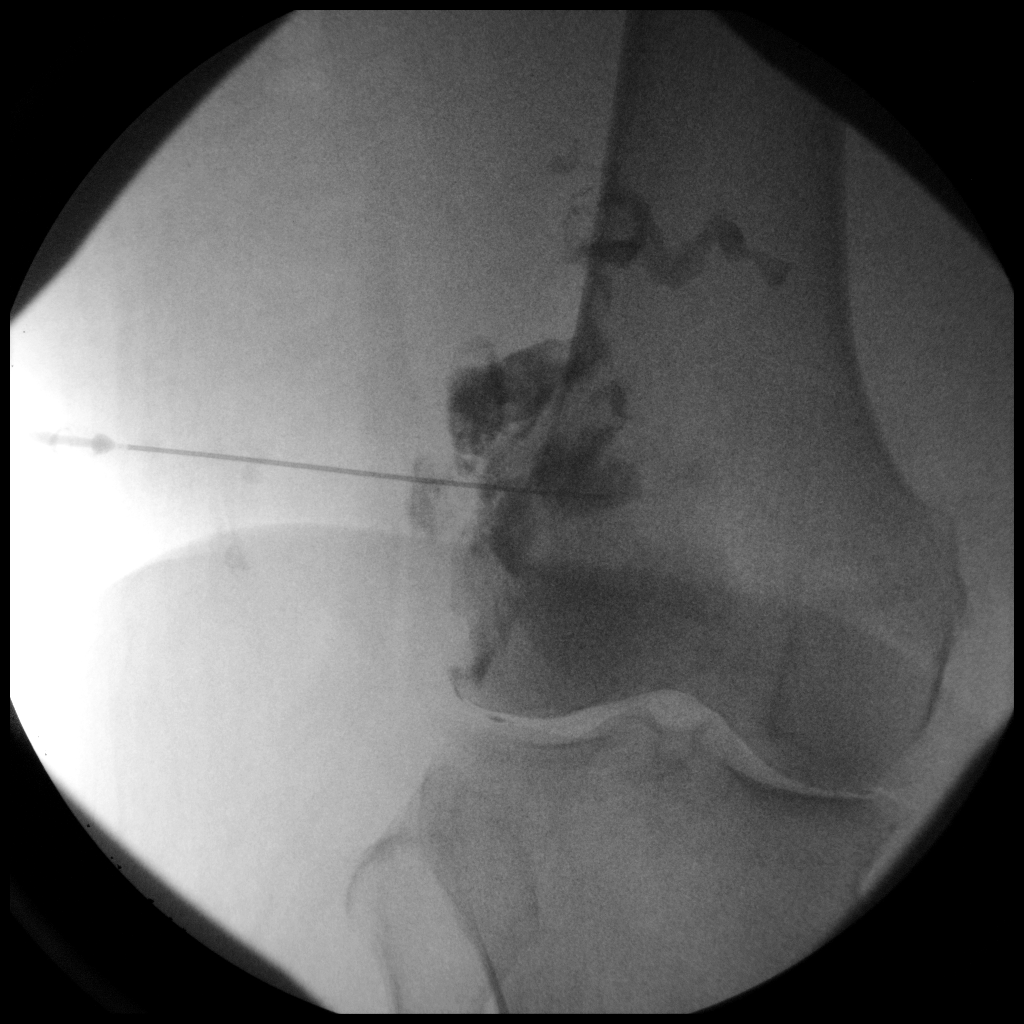

[Series 2: run · 1 of 1 slices shown (2 of 5)]
[im 1/1]
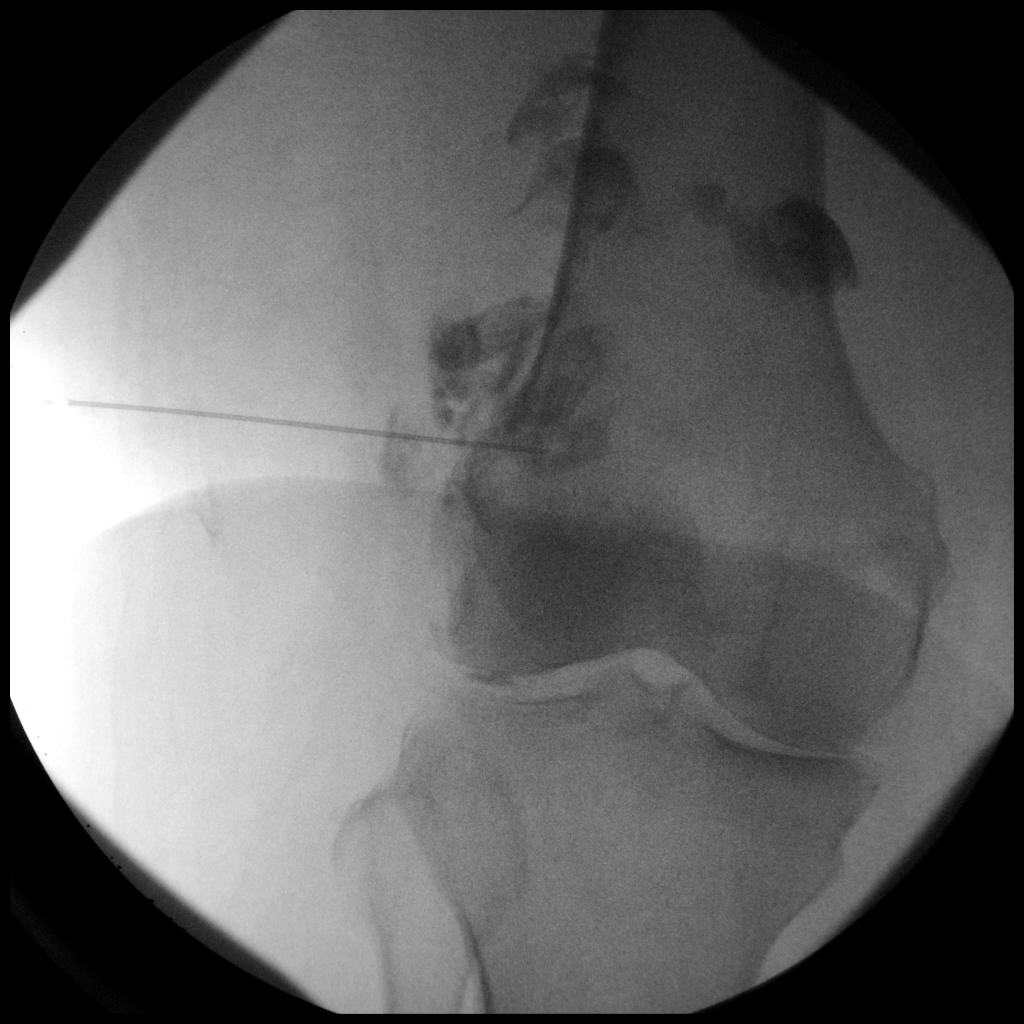

[Series 3: run · 1 of 1 slices shown (3 of 5)]
[im 1/1]
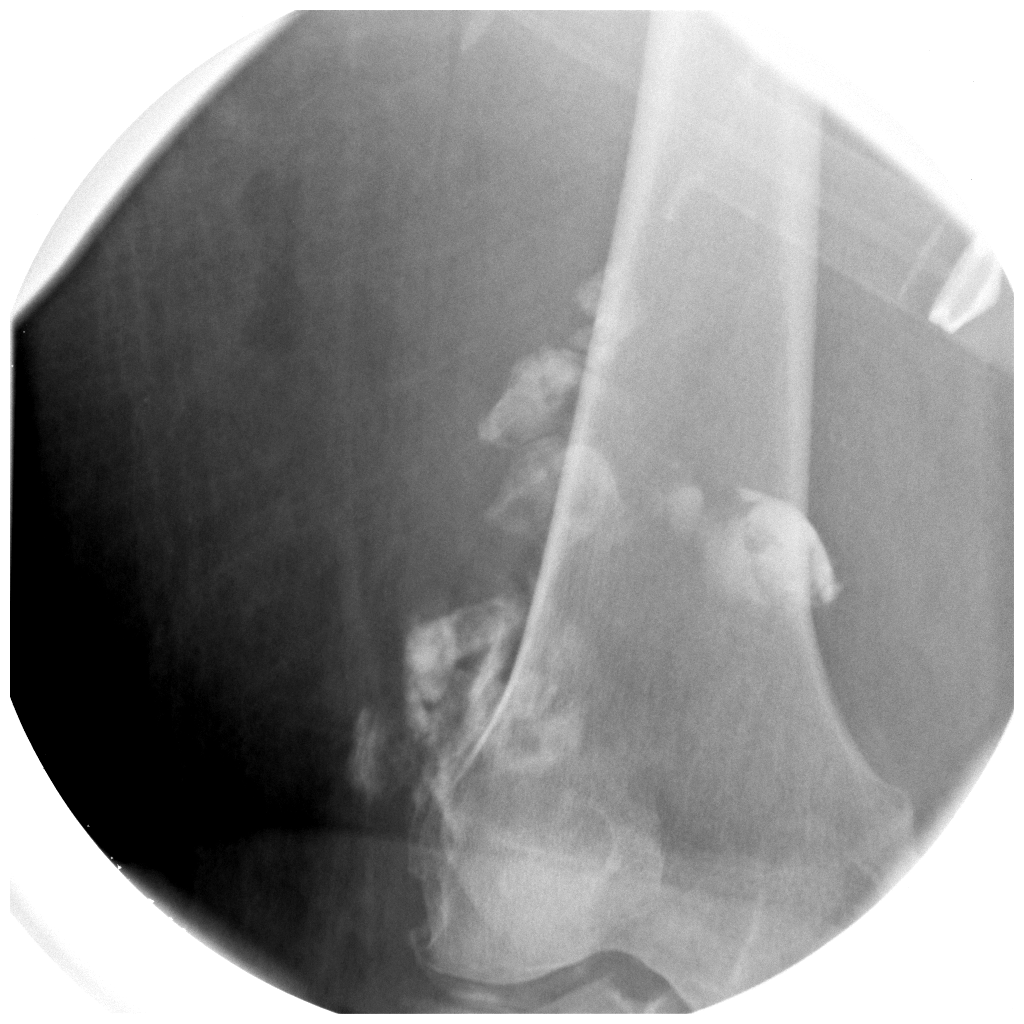

[Series 4: run · 1 of 1 slices shown (4 of 5)]
[im 1/1]
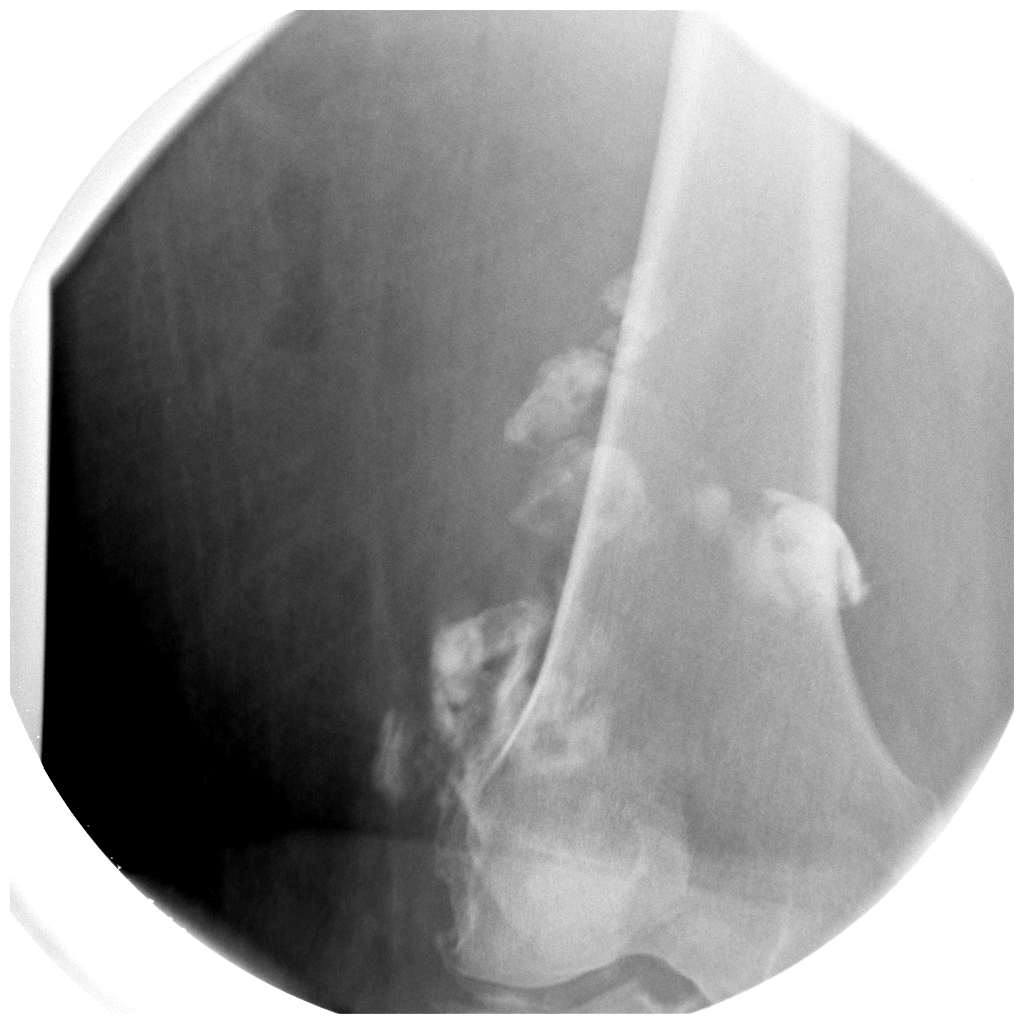

[Series 5: run · 1 of 1 slices shown (5 of 5)]
[im 1/1]
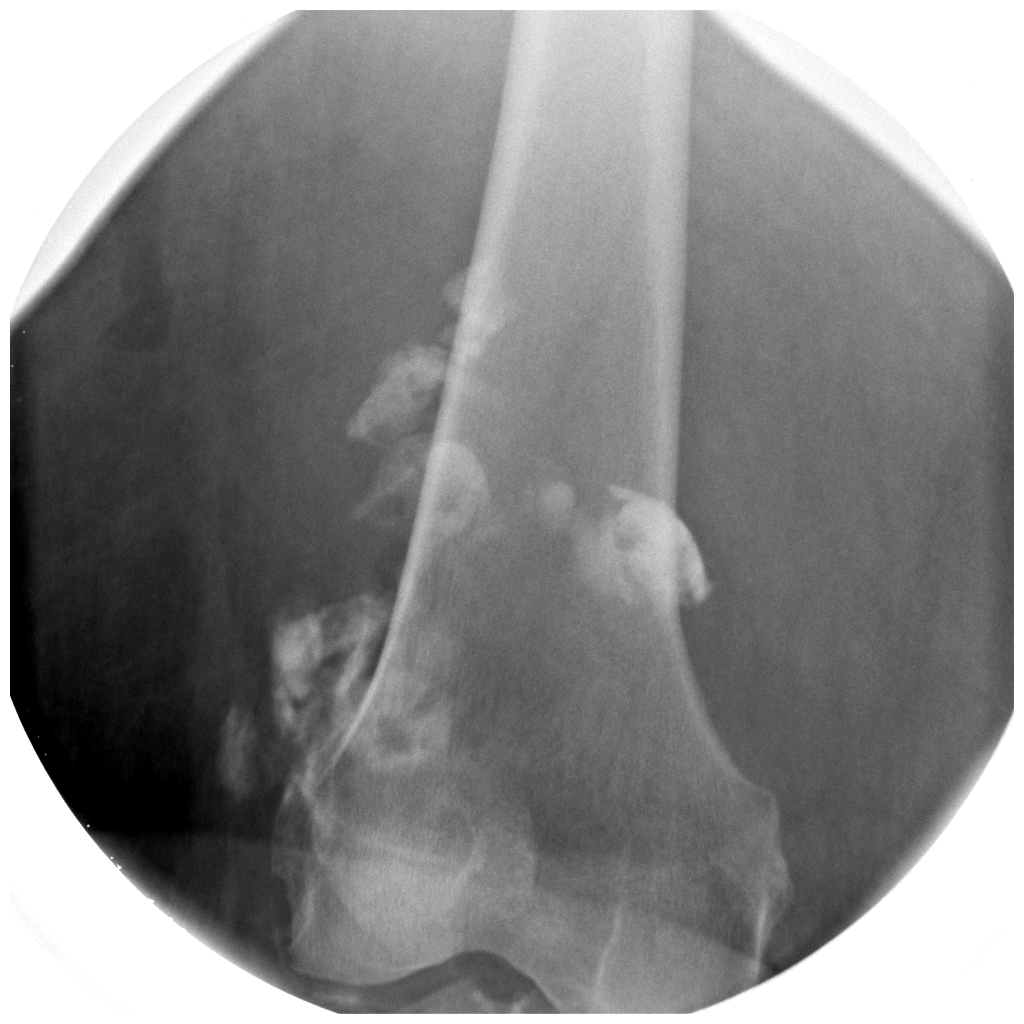

[5 of 5 positions shown; findings below may reference images not displayed]

FINDINGS: Study was severely challenging due to the patient's body
habitus and bandages laterally within the thigh and knee region.
The skin was prepped and draped in sterile fashion.  Using
fluoroscopic guidance, a 20 gauge spinal needle was advanced into
the right knee joint in the region of the suprapatellar bursa after
multiple failed attempts.  Contrast injection shows passage of
contrast inferiorly between the menisci confirming intra-articular
location.  Contrast also passes superiorly beyond what is felt to
be suprapatellar bursa and likely signifies communication to the
large fluid collection/pocket recently surgically drained.

No spontaneous aspiration of fluid could be obtained.  Approximate
10 ml of sterile saline was injected into the joint.  2 drops of
serosanguinous fluid was then aspirated and sent for culture.
IMPRESSION: Successful placement of needle within the right knee joint verified
with contrast injection.  Contrast injection also shows what
appears to be communication to the larger pocket superiorly, likely
the previously surgically drained fluid collection.

A sample was sent for culture.

## 2013-09-29 DIAGNOSIS — Z8614 Personal history of Methicillin resistant Staphylococcus aureus infection: Secondary | ICD-10-CM

## 2013-09-29 HISTORY — DX: Personal history of Methicillin resistant Staphylococcus aureus infection: Z86.14

## 2013-10-10 ENCOUNTER — Encounter: Payer: Self-pay | Admitting: Family Medicine

## 2015-01-16 NOTE — Consult Note (Signed)
Brief Consult Note: Diagnosis: nausea, vomiting, epigastric pain.   Patient was seen by consultant.   Consult note dictated.   Recommend to proceed with surgery or procedure.   Discussed with Attending MD.   Comments: Patient seen and examined, please see full Gi consult [phone].  Patietn admitted with n/v and epigastric pain.  History of daily nsaid use for chronic joint pain issues, including BC powders daily.  No history of melena or recognized black stools.  Continue iv ppi and antiemetics.  recommend egd when clinically feasible.  She will need to be done with anesthesia assistance due to OSA, morbid obesity.  Discussed with Dr Elisabeth Pigeon.  Electronic Signatures: Barnetta Chapel (MD)  (Signed 29-Dec-13 18:23)  Authored: Brief Consult Note   Last Updated: 29-Dec-13 18:23 by Barnetta Chapel (MD)

## 2015-01-16 NOTE — Consult Note (Signed)
DATE OF BIRTH:  06-29-1951  DATE OF CONSULTATION:  09/26/2012  REFERRING PHYSICIAN:  Dr. Elisabeth Pigeon CONSULTING PHYSICIAN:  Christena Deem, MD  REASON FOR CONSULTATION:  Intractable nausea, vomiting.   HISTORY OF PRESENT ILLNESS:  The patient is a 64 year old Caucasian female, who was admitted yesterday evening with problems of nausea and vomiting. She has a fairly complicated history over the past couple of months. She states that she was diagnosed with a right knee meniscus tear about 3 weeks ago, was then told to follow up with her orthopedic surgeon, Dr. Rosita Kea. At that time, there was also an MRI done, which showed this lesion. The pain became worse, and she decided to go to [facility]. This was around Thanksgiving. She was admitted after a second visit there and was seen by an orthopedist. The meniscus tear was repaired. However, the pain did not abate. A repeat study on the affected leg showed an abscess in her thigh. This was drained by incision and drainage, and she was placed on an extended course of IV vancomycin via PICC line as an outpatient, which she continues currently. Then she states that when she got out of the hospital about 2 weeks ago, she had 1 day of very severe nausea and vomiting, but then it seemed to get better by the next day. In the interim over the next 10 to 14 days or so, she was doing rather well until yesterday morning. She awoke at about 6:00, had some nausea and emesis, as well as some epigastric pain. This continued over the course of the day, and by 6:00 p.m., she came to the Emergency Room where she was admitted. She states that currently she is feeling a lot better. She has been treated with IV PPI, as well as antiemetics. She is continuing on the IV vancomycin. In discussing all of this with her further, she apparently has had intermittent problems with nausea and vomiting for many months. She takes [facility] Powders on a near daily basis, if not daily. She also takes  ibuprofen on a regular basis as well. Prior taking these 2 agents, she was taking Celebrex for over 2 years. She has a remote history of peptic ulcer disease at about the age of 67. She has never had an upper scope/endoscopy in the past. She did have a colonoscopy done 03/21/2008. In review of that procedure, she was noted to have several colon polyps that were removed, as well as a diverticulosis of the sigmoid colon. One of the polyps was consistent with a tubular adenoma, and she is due for repeat colonoscopy this coming summer. She states that she generally has a bowel movement every couple of days and takes multiple stool softeners daily to achieve this. She denies any heartburn. She does not take any heartburn medications, but does state that she occasionally gets some symptoms of dysphagia. She has not had to regurgitate foods, but at times it seems to be a little difficult to swallow.   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 Apparently, that was revised surgically as well. She has a history of diabetes, hypertension, hypercholesterolemia, obstructive sleep apnea, asthma, bipolar disorder, hypothyroidism, morbidly obese. As stated above, also with a history of right meniscal tear and abscess in the right thigh. She has also had back surgery in April 2013 with fusions of L2, L3, and L4.   REVIEW OF SYSTEMS:  Per admission history and physical.  SOCIAL HISTORY:  She is a previous smoker. She occasionally drinks alcohol.   PHYSICAL EXAMINATION:  VITAL SIGNS:  Temperature is 98.7, pulse 87, respirations 20, blood pressure 117/74, pulse ox 95%.  GENERAL:  She is a 64 year old Caucasian female, no acute distress, at times near tearful.  HEENT:  Normocephalic, atraumatic. Eyes are anicteric. Nose, septum midline, no lesions. Oropharynx, no lesions.  NECK:  No JVD.  HEART:  Regular rate and rhythm.  LUNGS:  Clear.  ABDOMEN:  Soft,  morbidly obese. She has multiple bruises over the abdomen consistent with probable Lovenox injections from her previous hospitalization. She is mildly tender to palpation in the epigastric region. When asked where her stomach hurts usually, she points to the epigastrium.  ANORECTAL:  Exam deferred.  EXTREMITIES:  There is some distal extremity/lower extremity peripheral vascular disease noted, possible mild stasis dermatitis.   LABORATORIES:  On admission to the hospital, she had a glucose of 167. Her metabolic panel was otherwise normal with the exception of magnesium being low at 1.6. Her hepatic profile showed a total protein slightly elevated at 9.1, albumin 3.3, otherwise normal. She had a troponin-I x 1 less than 0.02. TSH was 0.482. A hemogram showed a Kramar count of 8.9, H and H of 11.8/35.9, platelet count of 404, MCV is 82. Urinalysis was unremarkable. She has not had any repeat labs today. She did have a CT scan of the abdomen and pelvis in regards to her abdominal pain and vomiting. She was found to have a nonobstructing stone in the proximal right ureter, a small umbilical hernia with herniation of fat. She had an abdominal ultrasound, which was a negative study, although limited. She had a portable chest film showing no acute abnormality.   ASSESSMENT:  Nausea, vomiting, epigastric pain. In review of her history, the patient has a strong history of taking powdered aspirin products, as well as NSAIDs on a regular basis for quite some time due to her various joint discomforts. Although she did not indicate seeing any black stools, my concern is that she may have an NSAID-related gastritis versus ulcer. She is feeling better after having several doses of IV PPI since her admission.   RECOMMENDATIONS:  1.  Continue IV PPI as you are.  2.  Recommend EGD when clinically feasible. The patient is morbidly obese with a history of obstructive sleep apnea and will need to be done with anesthesia  assistance. I have discussed the risks, benefits, and complications of EGD to include, not limited to, bleeding, infection, perforation, and the risk of sedation, and she wishes to proceed. It is possible that we might be able to do this as an outpatient. However, we will reassess tomorrow morning. Would continue a clear liquid diet for now.      ____________________________ Christena Deem, MD mus:ms D: 09/26/2012 18:20:10 ET T: 09/26/2012 19:09:17 ET JOB#: [id number]  cc: Christena Deem, MD, <Dictator> Christena Deem MD ELECTRONICALLY SIGNED 09/28/2012 7:21

## 2015-01-16 NOTE — Consult Note (Signed)
Chief Complaint:   Subjective/Chief Complaint seen for n/v.  patient with some continued epigastric pain, minimal nausea.  no emesis.   VITAL SIGNS/ANCILLARY NOTES: **Vital Signs.:   31-Dec-13 04:45   Vital Signs Type Routine   Temperature Temperature (F) 97.6   Celsius 36.4   Temperature Source Oral   Pulse Pulse 77   Respirations Respirations 20   Systolic BP Systolic BP 147   Diastolic BP (mmHg) Diastolic BP (mmHg) 75   Mean BP 99   Pulse Ox % Pulse Ox % 95   Pulse Ox Activity Level  At rest   Oxygen Delivery Room Air/ 21 %   Brief Assessment:   Cardiac Regular    Respiratory clear BS    Gastrointestinal details normal Soft  Bowel sounds normal  No rebound tenderness  mild epigastric tender to palpation.  obese, unable to palpate internal organs.   Lab Results: Thyroid:  29-Dec-13 00:16    Thyroid Stimulating Hormone 0.482 (0.45-4.50 (International Unit)  ----------------------- Pregnant patients have  different reference  ranges for TSH:  - - - - - - - - - -  Pregnant, first trimetser:  0.36 - 2.50 uIU/mL)  TDMs:  29-Dec-13 00:16    Vancomycin, Trough LAB 11 (Result(s) reported on 26 Sep 2012 at 01:13AM.)  Routine Hem:  28-Dec-13 17:29    Platelet Count (CBC) 404 (Result(s) reported on 25 Sep 2012 at 05:54PM.)   Assessment/Plan:  Assessment/Plan:   Assessment 1) epigastric pain n/v in the setting of daily nsaid use.   2) thigh abscess recently with I+D/on iv abx, recent meniscal repair.  3) morbid obesity.    Plan 1) egd today.  I have discussed the risks benefits and complications of egd to include not limited to bleeding infection perforation and sedation and she wishes to proceed.  Further recs to follow.   Electronic Signatures: Barnetta Chapel (MD)  (Signed 31-Dec-13 09:30)  Authored: Chief Complaint, VITAL SIGNS/ANCILLARY NOTES, Brief Assessment, Lab Results, Assessment/Plan   Last Updated: 31-Dec-13 09:30 by Barnetta Chapel (MD)

## 2015-01-16 NOTE — H&P (Signed)
DATE OF BIRTH:  Jul 27, 1951    / Amy Barnes  PRIMARY CARE PHYSICIAN:  Dr. [name].  REFERRING PHYSICIAN:  Dr. Malachy Moan.   CHIEF COMPLAINT:  Repetitive vomiting nonstop for 24 hours.   HISTORY OF THE PRESENT ILLNESS:  The patient is a 64 year old Caucasian female with history of diabetes mellitus and morbid obesity and history of bipolar disorder. About 2 weeks ago, she was admitted at [facility] after having right knee meniscal knee joint repair. Then after that, she developed an abscess in the right thigh area that was opened, and she was placed on IV vancomycin twice a day, and the patient is still receiving at home intravenous vancomycin via PICC line. The patient states that a week ago on Sunday, she started to have repetitive vomiting, but then it eased off and resolved completely by Monday morning. Thereafter, she did well until today since the morning. She woke up with repetitive vomiting. This continued throughout the day nonstop. After a while, from vomiting she developed epigastric discomfort, and then after a while, developed crampy abdominal pain at the central abdominal area and more so towards the left. She described the pain as crampy, and the severity reached 9 on a scale of 10, but right now is down to 5 on a scale of 10. There is no hematemesis. No coffee-ground emesis. No melena or hematochezia. Her last bowel movement was today in the late afternoon, and it was normal in color. She reports no fever, no chills.   REVIEW OF SYSTEMS:  CONSTITUTIONAL:  Denies having any fever. No chills, no fatigue.  EYES:  No blurring of vision, no double vision.  ENT:  No hearing impairment, no sore throat, no dysphagia.  CARDIOVASCULAR:  No chest pain, no shortness of breath, no edema, no syncope.  RESPIRATORY:  No shortness of breath, no chest pain, no cough, no hemoptysis.  GASTROINTESTINAL:  Describes repetitive vomiting and abdominal pain as described above. No melena, no  hematochezia.  GENITOURINARY:  No dysuria, no frequency of urination.  MUSCULOSKELETAL:  No joint pain or swelling. No muscular pain or swelling.  INTEGUMENTARY:  No skin rash, no ulcers. The right knee joint incision site is clean.  NEUROLOGY:  No focal weakness, no seizure activity, no headache.  PSYCHIATRY:  She appears to be somewhat anxious. She does have history of bipolar disorder.  ENDOCRINE:  No polyuria or polydipsia. No heat or cold intolerance.  HEMATOLOGY:  She has a small subcutaneous hematoma at the abdomen site of subcutaneous Lovenox injections, for which she has 1 dose left. No lymph node enlargement.   PAST MEDICAL HISTORY:  Two weeks ago, she had right knee joint meniscal repair, thereafter developed abscess in the right thigh that was drained, and now she is placed on IV vancomycin receiving 1250 mg twice a day. A week ago, her vancomycin trough level was 20. Her history also includes diabetes mellitus type 2 receiving injections of Victoza. Hyperlipidemia. Hypertension and hypothyroidism.   PAST SURGICAL HISTORY:  Recent right knee joint meniscal tear repair, left hip replacement, partial hysterectomy, right foot surgery, back surgery and neck surgery, spinal surgery at L4-L5.   SOCIAL HABITS:  Nonsmoker. No history of alcohol or drug abuse. She drinks alcohol only socially.   SOCIAL HISTORY:  She is married, living with her husband. She works as an Airline pilot.   FAMILY HISTORY:  Her father suffered from heart attack, but he died at the age of 48 from brain aneurysm. Her mother died  at a young age at the age of 76 after having a heart attack.   ADMISSION MEDICATIONS:  She is on Victoza 1.8 mcg once a day. Vancomycin 1250 mg twice a day, Trileptal 300 mg once a day in the morning and two at bedtime. Singulair 10 mg a day, Simvastatin 40 mg once a day, Roxicodone 10 mg every 4 hours p.r.n. or Percocet 10/325 q. 6 hours p.r.n. Promethazine 25 mg p.r.n. for nausea, metformin 500  mg once a day and Lovenox 80 mg. She has one dose left and then to be discontinued. Losartan 50 mg once a day, levothyroxine 150 mcg once a day, lamotrigine 200 mg once a day in the morning. Iron 65 mg once a day, furosemide 80 mg once a day as needed, Colace 50 mg 2 capsules once a day, Diflucan 150 mg a day, clonazepam 0.5 mg twice a day p.r.n., Centrum Silver 1 tablet a day, calcium and vitamin D 600 mg once a day, Bayer aspirin 81 mg once a day, Advair Diskus 100 mcg 1 puff twice a day reported as needed.   ALLERGIES:  SULFA CAUSES SKIN RASH AND HIVES and KEFLEX CAUSES SKIN RASH AND HIVES.   PHYSICAL EXAMINATION:  VITAL SIGNS:  Her blood pressure 178/75, respiratory rate 16, pulse 105, oxygen saturation 93%.  GENERAL APPEARANCE:  Elderly female lying in bed, morbidly obese, looks slightly anxious. She has in front of her a plastic bag ready to use if she has an episode of vomiting.  HEENT:  Head and neck examination, no pallor, no icterus, no cyanosis. Ear examination revealed normal hearing, no discharge, no lesions. Nasal mucosa was normal, no ulcers, no discharge. Oropharyngeal area was normal without oral thrush, no exudate, no ulcers. Eye examination revealed normal eyelids and conjunctiva. Pupils about 5 mm, round, equal and reactive to light.  NECK:  Supple. Trachea at midline. No thyromegaly, no cervical lymphadenopathy, no masses. Scar tissue on the right side of the neck from previous central line looks healing well.  HEART:  Examination revealed normal S1, S2. No S3, S4. No murmur, no gallop, no carotid bruits.  RESPIRATORY:  Examination revealed normal breathing pattern without use of accessory muscles. No rales, no wheezing.  ABDOMEN:  Morbidly obese, soft. No significant tenderness apart from mild tenderness on the left side of the abdomen, none localized. No rebound, no rigidity. There were a few subcutaneous hematomas from her Lovenox injections.  SKIN:  No ulcers, no subcutaneous  nodules. The abscess site on the right thigh healed well, and the incision is clean.  MUSCULOSKELETAL:  No joint swelling, no clubbing.  NEUROLOGIC:  Cranial nerves II through XII were intact. No focal motor deficit. Sense of touch was preserved.  PSYCHIATRIC:  The patient was alert and oriented x 3. Mood and affect was that of sadness.   LABORATORY FINDINGS:  Ultrasound of the abdomen showed negative findings. Gallbladder appears normal. Murphy sign was negative. Pancreas appeared to be normal. CAT scan of the abdomen showed a 2-mm stone in the proximal right ureter. This is nonobstructing. There is a small umbilical hernia with herniation of fat only. Small injection sites in the abdominal wall. Otherwise unremarkable. Serum glucose 167, BUN 8, creatinine 0.7, sodium 137, potassium 3.6, magnesium is 1.6, liver function tests were normal, slightly low albumin at 3.3. Normal liver transaminases. Troponin 0.02. CBC showed Desch count of 8000, hemoglobin 11.8, hematocrit 35, platelet count 404. Urinalysis was unremarkable.   ASSESSMENT:  1.  Intractable vomiting. Etiology  is unclear. Differential diagnosis will include gastritis, peptic ulcer disease, gastroparesis from diabetes. Medication side effects, especially the different types of narcotics.   2.  Recent right thigh abscess formation that underwent incision and drainage, and she is now placed on IV vancomycin.  3.  Diabetes mellitus type 2.  4.  Systemic hypertension.  5.  Hyperlipidemia.  6.  Hypothyroidism.  7.  Hypomagnesemia.  8.  Morbid obesity.  9.  Bipolar disorder.   PLAN:  We will admit the patient to the medical floor. Keep n.p.o. except for medications. IV hydration. Zofran p.r.n. and Phenergan p.r.n. Protonix 40 mg IV twice a day. GI consultation. I will check vancomycin level to ensure there is no toxicity. Check TSH as well. I will hold some of her unnecessary medications, but in particular, I will stop the narcotics. I will  place her on morphine p.r.n. instead. Hold the metformin and Victoza until she is able to eat and drink. She will be on sliding scale instead.   TIME SPENT:  Evaluating this patient took more than 55 minutes.      ____________________________ Carney Corners. Rudene Re, MD amd:ms D: 09/25/2012 23:39:56 ET T: 09/26/2012 23:10:27 ET JOB#: [id number]  cc: Carney Corners. Rudene Re, MD, <Dictator> Zollie Scale MD ELECTRONICALLY SIGNED 09/28/2012 6:32

## 2015-01-16 NOTE — Consult Note (Signed)
Chief Complaint:   Subjective/Chief Complaint Please see EGD report.  Patient with bile gastritis, likely cause of n/v.  continue bid ppi, carafate qid,  no asa or nsaids. FU gi in 4 weeks.  Will sign off, reconsult gi on call if needed.   Electronic Signatures: Barnetta Chapel (MD)  (Signed 31-Dec-13 14:37)  Authored: Chief Complaint   Last Updated: 31-Dec-13 14:37 by Barnetta Chapel (MD)

## 2015-01-16 NOTE — Consult Note (Signed)
Chief Complaint:   Subjective/Chief Complaint mild epigastric discomfort, improved nausea, no vomiting.   VITAL SIGNS/ANCILLARY NOTES: **Vital Signs.:   30-Dec-13 13:37   Vital Signs Type Routine   Temperature Temperature (F) 98.1   Celsius 36.7   Temperature Source Oral   Pulse Pulse 82   Respirations Respirations 20   Systolic BP Systolic BP 134   Diastolic BP (mmHg) Diastolic BP (mmHg) 76   Mean BP 95   Pulse Ox % Pulse Ox % 97   Pulse Ox Activity Level  At rest   Oxygen Delivery Room Air/ 21 %   Brief Assessment:   Cardiac Regular    Respiratory clear BS    Gastrointestinal details normal Soft  Nondistended  No masses palpable  Bowel sounds normal  No rebound tenderness  mild epigastric tenderness, obese, unable to palpate internal organs.   Assessment/Plan:  Assessment/Plan:   Assessment 1) epigastric pain n/v in the setting of daily nsaid/asa use.  Now on ppi.   2) recent I+D of thigh abscess, on iv vanc, recent meniscal repair.    Plan 1) for egd tomorrow am.  I have discussed the risks benefits and complicatiosn of egd to include not limited to bleeding infection perforation and sedation and she wishes to proceed.  further recs to follow. continue ppi.   Electronic Signatures: Barnetta Chapel (MD)  (Signed 30-Dec-13 20:30)  Authored: Chief Complaint, VITAL SIGNS/ANCILLARY NOTES, Brief Assessment, Assessment/Plan   Last Updated: 30-Dec-13 20:30 by Barnetta Chapel (MD)

## 2015-01-19 NOTE — Discharge Summary (Signed)
PATIENT NAME:  Amy Barnes, Amy Barnes MR#:  [id number] DATE OF BIRTH:  07-30-1951  DATE OF ADMISSION:  09/25/2012 DATE OF DISCHARGE:  09/28/2012  DISCHARGE DIAGNOSES: 1. Intractable vomiting. 2. Gastritis. 3. Ulcer disease. 4. Ligamental injury with bursitis. 5. Chronic pain. 6. Obesity.  7. Hypothyroidism.  8. Hypomagnesemia.   HISTORY OF PRESENTING ILLNESS: The patient is a 64 year old Caucasian female with history of diabetes mellitus and morbid obesity, history of bipolar disorder. About 2 weeks ago she was admitted at [facility] for joint repair after having right knee meniscal injury. She also developed an abscess in the right thigh which was in the area of bursitis, so she was placed a PICC line and discharged home with IV vancomycin and advised to follow up. She said that a few days ago she started having repeated vomiting, but then that resolved after 1 day. Then she was doing well for the next couple of days but then, again, started having epigastric discomfort and severe vomiting for 1 day before presentation coming to the Emergency Room. She had severe crampy abdominal pain on the left side while having vomiting; on a scale of 10, it was 9. Bowel movements were normal in color, and there was no fever or chills on presentation.   HOSPITAL COURSE AND STAY: On admission, it was thought that maybe the differential includes gastritis, or peptic ulcer disease, or gastroparesis from diabetes or medication side effect as she was taking a lot of narcotics for pain control. She was admitted to the medical floor, kept n.p.o. except medications. She was hydrated and supporting medications were given to control her pain and her vomiting. She was started on IV PPI b.i.d. GI consult was done. GI suggested to get endoscopy which was delayed for 1 day due to her morbid obesity, and sleep apnea, and need of general anesthesia. The findings on endoscopy are as following: Z lines irregular, biopsied; mild gastritis,  biopsied; normal examined duodenum. Recommendations: Use Protonix 40 mg p.o. daily. Use sucralfate tablets 1 gram p.o. 4 times a day. Return to GI clinic in 4 weeks. Her vomiting stopped with Zofran and Phenergan, and she was started on all the supportive medications to prevent further episodes of gastritis and help her to resolve that. So, she was discharged home with instructions to follow in GI clinic to follow up, and she was advised to continue following with her orthopedic doctor and infectious disease doctor as directed by them on discharge from [facility].   Other medical issues addressed during the hospital stay: 1. Hypothyroidism:  She was continued on replacement orally. 2. Hypomagnesemia: She was replaced.  3. Pain management: Initially she was off all narcotics, but then her vomiting came under control, and she was started back on regimen she was taking at home to control her pain, and she was comfortable with that. Her pain and she was comfortable with that.  4. Diabetes: As initially she was n.p.o., she was just given regular insulin sliding scale coverage, but later on when she started eating she was started back on the same home medication regimen and discharged on that.  5. Right knee ligamental injury, status post repair, status post infection: She was continued on vancomycin as she was advised by her orthopedic doctor and infectious disease, and she was discharged home with the same.   CONDITION ON DISCHARGE: Stable.   CODE STATUS ON DISCHARGE: FULL CODE.   MEDICATIONS ADVISED ON DISCHARGE:  1. Losartan 50 mg oral tablet once a  day.  2. Simvastatin 40 mg oral tablet once a day.  3. Singulair 10 mg oral tablet every other day at bedtime.  4. Trileptal 300 mg oral tablet once a day.  5. Lamotrigine 200 mg oral tablet once a day.  6. Levothyroxine 150 mcg once a day.  7. Clonazepam 0.5 mg oral tablet 2 times a day.  8. Docusate sodium 50 mg oral capsule, 2 capsules orally once a  day.  9. Centrum Silver oral tablet once a day.  10. Caltrate  600 Plus D oral tablet once a day.  11. Advair Diskus 1 puff inhaled as needed.  12. Furosemide 80 mg oral tablet once a day as needed.  13. Metformin Extended Release 500 mg oral tablet once a day.  14. Promethazine 25 mg oral tablet 2 tabs orally as needed.  15. Lovenox 80 mg/0.8 mL injectable solution injectable once a day.  16. Vancomycin 1250 mg orally 2 times a day.  17. Diflucan 150 mg oral once a day for 7 days as needed.  18. Oxycodone 10 mg oral tablet every 6 hours as needed for pain.  19. Morphine Extended Release 15 mg, 12 hours oral tablet 2 times a day for 7 days. 20. Magnesium oxide 400 mg oral tablet once a day for 5 days. 21. Cyclobenzaprine 10 mg oral tablet every 8 hours for 4 days as needed.  22. Pantoprazole Delayed Release tablet 40 mg orally 2 times a day.  23. Sucralfate 1 gram oral tablet 4 times a day for 30 days.  24. Feosol 325 mg oral tablet 2 times a day for 30 days.   HOME HEALTH ON DISCHARGE: Yes. Advised to resume the nurse aide services and home health aide.   HOME OXYGEN: No.   DIET ON DISCHARGE: Low sodium, low fat, low cholesterol, carbohydrate-controlled ADA diet, regular consistency.   DISCHARGE ACTIVITY LIMITATIONS: As tolerated.   RETURN TO WORK: After followup with MD.   FOLLOWUP: Advised to follow up within 1 to 2 weeks with her primary care physician and gastroenterologist, and she was advised to have her appointment with orthopedic and ID doctors as advised on discharge from [facility].    TOTAL TIME SPENT ON DISCHARGE: 45 minutes.  ____________________________ Hope Pigeon Elisabeth Pigeon, MD vgv:cb D: 10/04/2012 08:33:07 ET T: 10/04/2012 09:13:08 ET JOB#: [id number]  cc: Hope Pigeon. Elisabeth Pigeon, MD, <Dictator> Christena Deem, MD Altamese Dilling MD ELECTRONICALLY SIGNED 11/09/2012 15:07

## 2015-01-21 NOTE — Discharge Summary (Signed)
PATIENT NAME:  Amy Barnes, Amy Barnes MR#:  [id number] DATE OF BIRTH:  1950/11/02  DATE OF ADMISSION:  09/09/2011 DATE OF DISCHARGE:  09/16/2011  ADMITTING DIAGNOSIS: Status post left hip incision and drainage with elevated sedimentation rate and previous left hip revision surgery.   DISCHARGE DIAGNOSES:  1. Status post left hip incision and drainage with elevated sedimentation rate and previous left hip revision surgery with cultures showing hip Pseudomonas aeruginosa infection.  2. Nausea and vomiting, improved.  3. Acute blood loss anemia.  4. Mild delirium, most likely due to pain medicine.   PROCEDURES PERFORMED:  1. Status post left hip incision and drainage by Dr. Rosita Kea.  2. On 09/12/2011 the patient underwent placement of a peripherally inserted central venous catheter with ultrasound and fluoroscopic guidance by Dr. Festus Barren.  ASSISTANT:  Tresa Moore, Vascular Technologist  ATTENDING: Kennedy Bucker, M.D. [facility])  CONSULTING PHYSICIANS:  1. Orson Aloe, MD. 2. Caryn Section, MD. 3. Festus Barren, MD  ANESTHESIA: Local.   ESTIMATED BLOOD LOSS: Minimal.   FLUOROSCOPY TIME: Less than one minute.  CONTRAST: None.  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 She had some persistent anterior left hip drainage and appeared to have a hematoma. She recently had a sedimentation rate and CBC. Schimming count was normal, but her sedimentation rate was elevated. Dr. Rosita Kea had spoken with the patient about this that it may be from infection or perhaps just a hematoma, but in either case he felt that she needed to have her wound cleaned out and a wound VAC applied.   ALLERGIES AND ADVERSE REACTIONS: Keflex and sulfa.   PAST MEDICAL HISTORY:  1. Diabetes. 2. Bipolar disorder.  3. Hypertension.  4. High cholesterol.  5. Sleep apnea.  6. Depression. 7. Asthma. 8. Arthritis.  9. Allergies.   10. Chickenpox.  11. Obesity with recent BMI of 64.   PHYSICAL EXAMINATION: HEART: Regular rate and rhythm. LUNGS: Clear to auscultation. LEFT HIP: Incision has a moderate amount of bloody drainage, but no purulent material. No foul odor. She is neurovascularly intact distally.   HOSPITAL COURSE: The patient had undergone the aforementioned left hip incision and drainage by Dr. Rosita Kea on 09/09/2011 without complication and was transferred to the PACU and then the orthopedic floor in stable condition. We had treated her with IV antibiotics and were waiting on culture results. Hemoglobin was 8.2 on the first day postoperatively. She did wear a left hip wound VAC which would be changed twice by Dr. Rosita Kea while she was here. TED hose were not fitting well and so she largely did not wear them while here. Dr. Leavy Cella had been consulted and saw the patient around 09/11/2011. The patient would use a bedside commode and would ambulate some while here actually. Dr. Leavy Cella had changed the patient's antibiotics to aztreonam IV and ciprofloxacin per oral. He decided not to use gentamicin because there is risk of renal toxicity with this. The patient would have her PICC line inserted on 09/12/2011. The patient did have some nausea and vomiting while here early on which would improve and she would end up tolerating her diet well. Her wound would drain quite a bit. She ended up using the bedside commode. She did work with physical therapy on a few occasions and ended up ambulating 200 feet at the end of her stay here.   The patient had reported seeing or hearing some things and having  some hallucinations and so Dr. Caryn Section with psychiatry had been consulted. The patient did not want to take the Risperdal Dr. Maryruth Bun had prescribed. The patient felt it was not a big deal and Dr. Maryruth Bun felt it was probably due to medication. The patient's vitamin B12, folic acid, and plasma ammonia were within normal limits on 09/13/2011.  The patient did have a blood transfusion after her hemoglobin dropped to 7.6, on 09/12/2011. Hemoglobin would end up at 7.9. The patient's wound VAC would drain a large amount, substantially more than the first time she was in the hospital.   CONDITION AT DISCHARGE: Stable.   DISPOSITION: Home with home health therapy and nursing.   DISCHARGE MEDICATIONS:  1. Nucynta 75 mg as needed for pain.  2. Risperdal as needed for hallucinations. 3. Oral ciprofloxacin  4. Intravenous aztreonam, same dosage as in the hospital.  DISCHARGE INSTRUCTIONS AND FOLLOW-UP: 1. She is weight-bearing as tolerated on her surgical leg.  2. Regular diet without any concentrated sweets or sugar.  3. She will resume her home medicines with a multivitamin. 4. She may apply an ice pack to her hip as needed for pain.  5. She will call our office for any disturbing symptoms.  6. She will have wound VAC changes every 3 to 4 days.            7. The patient does have an appointment to follow up with [place] in the office on 09/22/2011 at 9 o'clock  a.m.  8. The patient will followup with Dr. Casimiro Needle Blocker on 09/24/2011 at 8:30 a.m.  ____________________________ Letta Moynahan. [place], [place] jrp:slb D: 09/19/2011 12:27:39 ET T: 09/20/2011 13:16:18 ET JOB#: [id number]  cc: Letta Moynahan. Clyde Canterbury, [place], <Dictator> Letta Moynahan Jerrald Doverspike PA ELECTRONICALLY SIGNED 09/30/2011 9:24

## 2015-01-21 NOTE — Discharge Summary (Signed)
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   DISCHARGE DIAGNOSES:  1. 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 2. Respiratory failure, resolved. 3. Metabolic acidosis, improved. 4. Acute blood loss anemia, improved and stable.   ATTENDING: Dr. Kennedy Bucker with [facility] orthopedics.   CONSULTING PHYSICIANS:  1. Dr. Alford Highland. 2. Dr. Katha Hamming. 3. Dr. Erin Fulling.  PRIMARY CARE PHYSICIAN: Dr. [name]   PROCEDURE: On 08/28/2011 the patient underwent left hip revision of acetabular and head components of total hip replacement. Previous stem was left in.   SURGEON: Dr. Kennedy Bucker.  ANESTHESIA: General.   ESTIMATED BLOOD LOSS: 1900 mL.  SPECIMEN: A culture was sent as a specimen.   OPERATIVE FINDINGS: Worn poly with superior osteolysis. Wound VAC drain was placed.   IMPLANTS: Included a 50 mm DM cup and head, and head that was 28 mm Biomet. A Bipolar head had been used.   PATIENT HISTORY: Ms. Faul is a 64 year old who had a left total hip replacement done about 10 years ago elsewhere. She had developed significant poly wear and had aching in the hip. Previously she discussed anterior hip replacement with Dr. Rosita Kea. Dr. Rosita Kea had spoken with the [employer] and [employer] rep and implants that would be available.   ALLERGIES AND ADVERSE REACTIONS: Include Keflex and sulfa.   PAST MEDICAL HISTORY:  1. Chickenpox.  2. Allergies.  3. Arthritis. 4. Asthma. 5. Depression. 6. Sleep apnea.  7. Hypertension.  8. High cholesterol.  9. Diabetes.  10. Bipolar disorder.   PHYSICAL EXAMINATION: HEART: Regular rate and rhythm. LUNGS: Clear to auscultation. LEFT HIP: She was found to have pain with any attempted rotation of the leg. Leg lengths appear  equal. She has a well-healed posterior hip incision of the lateral hip.   LABORATORY, DIAGNOSTIC AND RADIOLOGICAL DATA: Previous x-rays of the left hip were reviewed and showed severe joint space narrowing as well as screws within the acetabular component. She did have significant polyethylene wear.   HOSPITAL COURSE: Patient underwent aforementioned procedure by Dr. Rosita Kea on 11/29. The procedure was extended and took 7 to 7.5 hours. She was transferred to the PAC-U and then to the Critical Care Unit. Medicine was consulted and also Dr. Belia Heman did see the patient. She had to be intubated. It was felt that her respiratory issues were stemming from prolonged surgery, obesity and sleep apnea. Eventually she would be able to be extubated. She would use CPAP machine for sleep apnea at night. Patient would go to using nasal cannula oxygen and eventually was fine with just room air. Quickly she would be able to be transferred down to the orthopedic floor, fortunately. For her diabetes issues sliding scale insulin and metformin would be used. She was put on losartan for her hypertension. Home medications had been restarted. She did have some postoperative anemia. She had undergone transfusion through cell saver on the day of her surgery with about 875 mL of blood being returned to her body as 1900 lost. The patient would have to undergo transfusion again as her hemoglobin dropped to 7.7 on 12/02. Her hemoglobin would be 8.3 on 12/03. She was complaining of significant pain, back on the 3rd so new x-rays were taken of her left hip which revealed no new fracture or dislocation  of the hip prosthesis. She was very slow out of the gate with physical therapy but in the end walked 170 feet with a walker on the 4th and walked 100 feet later on that day as well. She would end up tolerating her diet well while here. She did have a large bowel movement on the 4th. Her Foley catheter had been discontinued on 12/01 and she would use a  bedside commode from there on out.   CONDITION AT DISCHARGE: Stable.   DISPOSITION: [facility].   DISCHARGE MEDICATIONS: 1. Guaifenesin LA tablets 600 mg oral twice a day as needed for cough and congestion.  2. Tylenol 500 to 1000 mg oral every six hours as needed for pain or fever. 3. Combivent oral inhaler 8 puffs inhalation every four hours.  4. Antacid DS suspension 30 mL every six hours as needed for heartburn.  5. Dulcolax 10 mg rectal daily as needed for constipation.  6. Senokot-S 1 tablet oral twice a day. 7. Flovent HFA 220 mcg inhaler 2 puffs inhalation every 12 hours.  8. Insulin sliding scale NovoLog injection subcutaneous blood sugar before meals and at bedtime. 9. Lamictal 200 mg oral daily. 10. Synthroid 0.075 mg oral every morning.  11. Losartan 50 mg oral daily.  12. Milk of magnesia 30 mL oral twice a day as needed for constipation. 13. Skelaxin 800 mg oral 3 times a day as needed for spasms.  14. Metformin 500 mg oral twice a day, give with meals. 15. Trileptal 300 mg oral daily and 600 mg oral at bedtime. 16. Ranitidine 150 mg oral daily. 17. Simvastatin 40 mg oral at bedtime.  18. Nucynta 75 to 150 mg oral every six hours as needed for pain.  19. Ambien 5 mg oral at bedtime as needed for insomnia.  20. Xarelto 10 mg take one every day for 30 days from 12/04 until finished.  21. Of note, patient was on Celebrex at home. This may be restarted if desired.    DISCHARGE INSTRUCTIONS AND FOLLOW UP:  1. Patient will follow up at [facility] orthopedics on 09/10/2009 at 1:15 p.m. for left hip staple removal. Until then please keep her left hip surgical site clean and dry. Dressing changes as needed.  2. OT and PT consult. She is weight-bearing as tolerated. Hip cautions are in place for total hip replacement.  3. 1800 calorie carbohydrate controlled diet.  4. TED hose knee-high are to be worn daily for six weeks following surgical date.  5. Apply  a K pad or heating pad to her back as needed for pain.  6. Universal skin precautions.  7. Soapsuds enema as needed.  8. Encourage incentive spirometry while awake.  9. Patient may use their CPAP or BiPAP home unit at night.  10. If there are any further questions, please call [facility] orthopedics.  ____________________________ Letta Moynahan Shellee Streng, [place] jrp:cms D: 09/02/2011 17:07:06 ET T: 09/03/2011 05:29:39 ET JOB#: [id number]  cc: Letta Moynahan. Clyde Canterbury, [place], <Dictator> [employer] R Latorie Montesano PA ELECTRONICALLY SIGNED 09/30/2011 9:22

## 2015-06-18 LAB — COMPREHENSIVE METABOLIC PANEL
ALT: 21
AST: 19 U/L
Alkaline Phosphatase: 78 U/L
Creat: 0.69
Potassium: 4.9 mmol/L
Sodium: 145
Total Bilirubin: 0.3 mg/dL

## 2015-06-18 LAB — LIPID PANEL
Cholesterol: 133
HDL: 64
LDL (calc): 57
Triglycerides: 62

## 2015-06-18 LAB — HEMOGLOBIN A1C: A1c: 6

## 2015-11-05 ENCOUNTER — Ambulatory Visit (INDEPENDENT_AMBULATORY_CARE_PROVIDER_SITE_OTHER): Payer: BLUE CROSS/BLUE SHIELD | Admitting: Family Medicine

## 2015-11-05 ENCOUNTER — Encounter: Payer: Self-pay | Admitting: Family Medicine

## 2015-11-05 ENCOUNTER — Encounter (INDEPENDENT_AMBULATORY_CARE_PROVIDER_SITE_OTHER): Payer: Self-pay

## 2015-11-05 VITALS — BP 134/78 | HR 80 | Temp 97.7°F | Ht 63.75 in | Wt 355.5 lb

## 2015-11-05 DIAGNOSIS — E7800 Pure hypercholesterolemia, unspecified: Secondary | ICD-10-CM | POA: Diagnosis not present

## 2015-11-05 DIAGNOSIS — I10 Essential (primary) hypertension: Secondary | ICD-10-CM

## 2015-11-05 DIAGNOSIS — E1142 Type 2 diabetes mellitus with diabetic polyneuropathy: Secondary | ICD-10-CM | POA: Diagnosis not present

## 2015-11-05 DIAGNOSIS — K219 Gastro-esophageal reflux disease without esophagitis: Secondary | ICD-10-CM

## 2015-11-05 DIAGNOSIS — Z6841 Body Mass Index (BMI) 40.0 and over, adult: Secondary | ICD-10-CM

## 2015-11-05 DIAGNOSIS — E039 Hypothyroidism, unspecified: Secondary | ICD-10-CM

## 2015-11-05 DIAGNOSIS — M1990 Unspecified osteoarthritis, unspecified site: Secondary | ICD-10-CM

## 2015-11-05 DIAGNOSIS — J4520 Mild intermittent asthma, uncomplicated: Secondary | ICD-10-CM

## 2015-11-05 MED ORDER — METFORMIN HCL ER 500 MG PO TB24: 1000.0000 mg | ORAL_TABLET | Freq: Every day | ORAL | Status: DC

## 2015-11-05 MED ORDER — CELECOXIB 200 MG PO CAPS: 200.0000 mg | ORAL_CAPSULE | Freq: Two times a day (BID) | ORAL | Status: DC

## 2015-11-05 MED ORDER — SIMVASTATIN 40 MG PO TABS: 40.0000 mg | ORAL_TABLET | Freq: Every evening | ORAL | Status: DC

## 2015-11-05 MED ORDER — PANTOPRAZOLE SODIUM 40 MG PO TBEC: 40.0000 mg | DELAYED_RELEASE_TABLET | Freq: Every day | ORAL | Status: DC

## 2015-11-05 MED ORDER — FUROSEMIDE 80 MG PO TABS: 40.0000 mg | ORAL_TABLET | Freq: Two times a day (BID) | ORAL | Status: DC

## 2015-11-05 NOTE — Patient Instructions (Addendum)
Exclude gas producing foods (beans, onions, celery, carrots, raisins, bananas, apricots, prunes, brussel sprouts, wheat germ, pretzels) Return in 4 months for DM follow up. Medicines refilled today Good to see you today, call us with questions.

## 2015-11-05 NOTE — Assessment & Plan Note (Signed)
Chronic, stable. Continue current regimen. 

## 2015-11-05 NOTE — Assessment & Plan Note (Signed)
Denies h/o this - levothyroxine is for bipolar disorder.

## 2015-11-05 NOTE — Assessment & Plan Note (Signed)
Encouraged exercise regimen for goal weight loss.

## 2015-11-05 NOTE — Progress Notes (Signed)
BP 134/78 mmHg  Pulse 80  Temp(Src) 97.7 F (36.5 C) (Oral)  Ht 5' 3.75" (1.619 m)  Wt 355 lb 8 oz (161.254 kg)  BMI 61.52 kg/m2   CC: new pt to REestablish care  Subjective:    Patient ID: Amy Barnes, female    DOB: [birth year], 65 y.o.   MRN: [id number]  HPI: Amy Barnes is a 65 y.o. female presenting on 11/05/2015 for Establish Care   Prior seen here last 2013 Ermalene Searing). In interim, moved to [place] and saw Dr Myrtie Soman. Now moved back locally. Here today with husband.   Increased gassiness over the past month. Gas X doesn't help. No abd pain, no bowel changes. Denies GERD or indigestion.   Pending upcoming cataract surgery tomorrow morning ([facility]).  H/o multiple L hip surgeries and revisions complicated by multiple infections. On chronic doxycycline 100mg  daily - saw ortho [facility] (Dr Mort Sawyers). Would like to return to local ortho Dr Sherlean Foot.  Chronic pain with osteoarthritis - on celebrex 200mg  BID. longterm medication since 1990s. Works well. Takes prn advil/aleve/bc powders  OSA - not on CPAP since last surgery.   DM - well controlled last check with A1c <7%.  Diabetic Foot Exam - Simple   Simple Foot Form  Diabetic Foot exam was performed with the following findings:  Yes 11/05/2015 10:39 AM  Visual Inspection  See comments:  Yes  Sensation Testing  Intact to touch and monofilament testing bilaterally:  Yes  Pulse Check  Posterior Tibialis and Dorsalis pulse intact bilaterally:  Yes  Comments  L callus formation at sole of foot       Bipolar - on lamotrigine, oxcarbazepine, and levothyroxine daily for this. Followed by Dr Nolen Mu.   Preventative: Last CPE 06/2015.  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 Interim medical history since our last visit reviewed. Allergies and medications reviewed and updated. Current Outpatient Prescriptions on File Prior to Visit  Medication Sig  . albuterol (PROVENTIL HFA;VENTOLIN HFA) 108 (90 BASE) MCG/ACT inhaler Inhale 2 puffs into the lungs every 6 (six) hours as needed. For shortness of breath  . aspirin 81 MG EC tablet Take 81 mg by mouth daily.    . Calcium Carbonate-Vitamin D (CALCIUM + D PO) Take 1 tablet by mouth daily.  . cyclobenzaprine (FLEXERIL) 10 MG tablet Take 1 tablet (10 mg total) by mouth 3 (three) times daily as needed. For muscle pain  . doxycycline (DORYX) 100 MG EC tablet Take 100 mg by mouth daily.   Marland Kitchen lamoTRIgine (LAMICTAL) 200 MG tablet Take 200 mg by mouth daily.    Marland Kitchen levothyroxine (SYNTHROID, LEVOTHROID) 150 MCG tablet Take 150 mcg by mouth daily.  . Liraglutide 18 MG/3ML SOLN Inject 1.8 mg into the skin daily.  Marland Kitchen losartan (COZAAR) 50 MG tablet Take 50 mg by mouth daily.  . montelukast (SINGULAIR) 10 MG tablet Take 10 mg by mouth at bedtime.  . Multiple Vitamins-Minerals (CENTRUM SILVER PO) Take 1 tablet by mouth daily.  . ondansetron (ZOFRAN) 8 MG tablet Take by mouth every 8 (eight) hours as needed.  . Oxcarbazepine (TRILEPTAL) 300 MG tablet Take 300-600 mg by mouth 2 (two) times daily. 1 in the morning and 2 every evening  . Oxycodone HCl 10 MG TABS Take  1 tablet (10 mg total) by mouth every 4 (four) hours as needed. (Patient not taking: Reported on 11/05/2015)   No current facility-administered medications on file prior to visit.    Review of Systems Per HPI unless specifically indicated in ROS section     Objective:    BP 134/78 mmHg  Pulse 80  Temp(Src) 97.7 F (36.5 C) (Oral)  Ht 5' 3.75" (1.619 m)  Wt 355 lb 8 oz (161.254 kg)  BMI 61.52 kg/m2  Wt Readings from Last 3 Encounters:  11/05/15 355 lb 8 oz (161.254 kg)  11/01/12 314 lb (142.429 kg)  10/25/12 314 lb (142.429 kg)    Physical Exam    Constitutional: She appears well-developed and well-nourished. No distress.  Morbidly obese, ambulates with cane  HENT:  Mouth/Throat: Oropharynx is clear and moist. No oropharyngeal exudate.  Eyes: Conjunctivae and EOM are normal. Pupils are equal, round, and reactive to light.  Neck: Normal range of motion. Neck supple.  Cardiovascular: Normal rate, regular rhythm, normal heart sounds and intact distal pulses.   No murmur heard. Pulmonary/Chest: Effort normal and breath sounds normal. No respiratory distress. She has no wheezes. She has no rales.  Musculoskeletal: She exhibits no edema.  2+ DP bilaterally. See foot exam section if diabetic foot exam done.  Skin: Skin is warm and dry. No rash noted.  Psychiatric: She has a normal mood and affect. Her behavior is normal. Thought content normal.  Nursing note and vitals reviewed.  Results for orders placed or performed in visit on 10/10/13  HM DIABETES EYE EXAM  Result Value Ref Range   HM Diabetic Eye Exam no diabetic retinopathy        Assessment & Plan:   Problem List Items Addressed This Visit    Osteoarthritis    S/p multiple surgeries. Await records to review. 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      Relevant Medications   celecoxib (CELEBREX) 200 MG capsule   Morbid obesity with BMI of 60.0-69.9, adult (HCC)    Encouraged exercise regimen for goal weight loss.      Relevant Medications   metFORMIN (GLUCOPHAGE-XR) 500 MG 24 hr tablet   Hypothyroidism    Denies h/o this - levothyroxine is for bipolar disorder.      HYPERCHOLESTEROLEMIA    Chronic. Check FLP next visit. Continue simvstatin.      Relevant Medications   furosemide (LASIX) 80 MG tablet   simvastatin (ZOCOR) 40 MG tablet   GERD    Chronic, stable on daily PPI      Relevant Medications   polyethylene glycol (MIRALAX / GLYCOLAX) packet   pantoprazole (PROTONIX) 40 MG tablet   Essential hypertension     Chronic, stable. Continue current regimen.      Relevant Medications   furosemide (LASIX) 80 MG tablet   simvastatin (ZOCOR) 40 MG tablet   DM type 2 with diabetic peripheral neuropathy (HCC) - Primary    Chronic, stable per pt report. Continue current regimen. Await records.      Relevant Medications   clonazePAM (KLONOPIN) 0.5 MG tablet   zaleplon (SONATA) 10 MG capsule   metFORMIN (GLUCOPHAGE-XR) 500 MG 24 hr tablet   simvastatin (ZOCOR) 40 MG tablet   Asthma    Mild. Stable on prn albuterol/advair      Relevant Medications   Fluticasone-Salmeterol (ADVAIR DISKUS IN)       Follow up plan: Return in about 4 months (around 03/04/2016), or  as needed, for follow up visit.

## 2015-11-05 NOTE — Assessment & Plan Note (Signed)
Chronic, stable per pt report. Continue current regimen. Await records.

## 2015-11-05 NOTE — Assessment & Plan Note (Signed)
Mild. Stable on prn albuterol/advair

## 2015-11-05 NOTE — Progress Notes (Signed)
Pre visit review using our clinic review tool, if applicable. No additional management support is needed unless otherwise documented below in the visit note. 

## 2015-11-05 NOTE — Assessment & Plan Note (Signed)
Chronic, stable on daily PPI.  

## 2015-11-05 NOTE — Assessment & Plan Note (Signed)
Chronic. Check FLP next visit. Continue simvstatin.

## 2015-11-05 NOTE — Assessment & Plan Note (Signed)
S/p multiple surgeries. Await records to review. 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

## 2015-11-22 ENCOUNTER — Encounter: Payer: Self-pay | Admitting: Family Medicine

## 2015-11-22 ENCOUNTER — Ambulatory Visit (INDEPENDENT_AMBULATORY_CARE_PROVIDER_SITE_OTHER): Payer: BLUE CROSS/BLUE SHIELD | Admitting: Family Medicine

## 2015-11-22 VITALS — BP 154/62 | HR 76 | Temp 98.2°F | Ht 63.75 in | Wt 349.2 lb

## 2015-11-22 DIAGNOSIS — K639 Disease of intestine, unspecified: Secondary | ICD-10-CM | POA: Insufficient documentation

## 2015-11-22 DIAGNOSIS — K5900 Constipation, unspecified: Secondary | ICD-10-CM

## 2015-11-22 NOTE — Assessment & Plan Note (Signed)
Likely cause of bloating and abdominal pain. Treat gentle given SE to dulcolax in past. Use Miralax , increase water, walk as able.  Doubt current obstruction given BMs today and  no emesis today, but if emesis returns... Consider eval with plain X-ray to eval for partial obstruction.

## 2015-11-22 NOTE — Progress Notes (Signed)
   Subjective:    Patient ID: Amy Barnes, female    DOB: 03/21/1951, 65 y.o.   MRN: [id number]  HPI  65 year old female with history of  DM, HTN, GERD, constipation  morbid obesity, bipolar disorder presents with new onset  Abdominal cramping, constipation, straining ever since cataract surgery 2 weeks ago.  Took dulcolax .Marland Kitchen Turned into diarrhea. Sat on toilet x 3 hours. Took immodium, peptobismol. Helped temporarily.  Ate bland diet.  2 days ago had cataracts surgery (given phenergan, mild sedative) on other eye. Started again with constipation and severe cramps. Only small BM, no blood.  No fever, no emesis tod.ay  Took peptobismol.. Had episode of emesis.  Social History /Family History/Past Medical History reviewed and updated if needed. Has history of narcotic related constipation... Not on narcotics in 1 year. On chronic antibioitcs for post op complication.  Review of Systems  Constitutional: Negative for fever and fatigue.  HENT: Negative for ear pain.   Eyes: Negative for pain.  Respiratory: Negative for chest tightness and shortness of breath.   Cardiovascular: Negative for chest pain, palpitations and leg swelling.  Gastrointestinal: Negative for abdominal pain.  Genitourinary: Negative for dysuria.       Objective:   Physical Exam  Constitutional: Vital signs are normal. She appears well-developed and well-nourished. She is cooperative.  Non-toxic appearance. She does not appear ill. No distress.  Morbidly obese  HENT:  Head: Normocephalic.  Right Ear: Hearing, tympanic membrane, external ear and ear canal normal. Tympanic membrane is not erythematous, not retracted and not bulging.  Left Ear: Hearing, tympanic membrane, external ear and ear canal normal. Tympanic membrane is not erythematous, not retracted and not bulging.  Nose: No mucosal edema or rhinorrhea. Right sinus exhibits no maxillary sinus tenderness and no frontal sinus tenderness. Left sinus exhibits  no maxillary sinus tenderness and no frontal sinus tenderness.  Mouth/Throat: Uvula is midline, oropharynx is clear and moist and mucous membranes are normal.  Eyes: Conjunctivae, EOM and lids are normal. Pupils are equal, round, and reactive to light. Lids are everted and swept, no foreign bodies found.  Neck: Trachea normal and normal range of motion. Neck supple. Carotid bruit is not present. No thyroid mass and no thyromegaly present.  Cardiovascular: Normal rate, regular rhythm, S1 normal, S2 normal, normal heart sounds, intact distal pulses and normal pulses.  Exam reveals no gallop and no friction rub.   No murmur heard. Pulmonary/Chest: Effort normal and breath sounds normal. No tachypnea. No respiratory distress. She has no decreased breath sounds. She has no wheezes. She has no rhonchi. She has no rales.  Abdominal: Soft. Normal appearance and bowel sounds are normal. There is generalized tenderness. There is no rigidity, no rebound, no guarding and no CVA tenderness.  Neurological: She is alert.  Skin: Skin is warm, dry and intact. No rash noted.  Psychiatric: Her speech is normal and behavior is normal. Judgment and thought content normal. Her mood appears not anxious. Cognition and memory are normal. She does not exhibit a depressed mood.          Assessment & Plan:

## 2015-11-22 NOTE — Progress Notes (Signed)
Pre visit review using our clinic review tool, if applicable. No additional management support is needed unless otherwise documented below in the visit note. 

## 2015-11-22 NOTE — Patient Instructions (Signed)
Start align or other probiotic like lactobaccilli. Take Miralax and increase water. Call if not improving as expected.

## 2015-11-25 ENCOUNTER — Emergency Department
Admission: EM | Admit: 2015-11-25 | Discharge: 2015-11-26 | Disposition: A | Discharge status: Home / Self Care | Payer: BLUE CROSS/BLUE SHIELD | Source: Home / Self Care | Attending: Emergency Medicine | Admitting: Emergency Medicine

## 2015-11-25 ENCOUNTER — Emergency Department: Payer: BLUE CROSS/BLUE SHIELD

## 2015-11-25 ENCOUNTER — Encounter: Payer: Self-pay | Admitting: Radiology

## 2015-11-25 DIAGNOSIS — K5792 Diverticulitis of intestine, part unspecified, without perforation or abscess without bleeding: Secondary | ICD-10-CM | POA: Diagnosis not present

## 2015-11-25 DIAGNOSIS — I10 Essential (primary) hypertension: Secondary | ICD-10-CM | POA: Insufficient documentation

## 2015-11-25 DIAGNOSIS — R079 Chest pain, unspecified: Secondary | ICD-10-CM

## 2015-11-25 DIAGNOSIS — Z79899 Other long term (current) drug therapy: Secondary | ICD-10-CM

## 2015-11-25 DIAGNOSIS — Z87891 Personal history of nicotine dependence: Secondary | ICD-10-CM

## 2015-11-25 DIAGNOSIS — Z794 Long term (current) use of insulin: Secondary | ICD-10-CM | POA: Insufficient documentation

## 2015-11-25 DIAGNOSIS — E1142 Type 2 diabetes mellitus with diabetic polyneuropathy: Secondary | ICD-10-CM | POA: Insufficient documentation

## 2015-11-25 DIAGNOSIS — Z7982 Long term (current) use of aspirin: Secondary | ICD-10-CM | POA: Insufficient documentation

## 2015-11-25 DIAGNOSIS — Z792 Long term (current) use of antibiotics: Secondary | ICD-10-CM | POA: Insufficient documentation

## 2015-11-25 DIAGNOSIS — K5900 Constipation, unspecified: Secondary | ICD-10-CM | POA: Insufficient documentation

## 2015-11-25 DIAGNOSIS — Z7984 Long term (current) use of oral hypoglycemic drugs: Secondary | ICD-10-CM | POA: Insufficient documentation

## 2015-11-25 DIAGNOSIS — R109 Unspecified abdominal pain: Secondary | ICD-10-CM | POA: Diagnosis not present

## 2015-11-25 LAB — CBC
HCT: 37 % (ref 35.0–47.0)
Hemoglobin: 12.3 g/dL (ref 12.0–16.0)
MCH: 28.3 pg (ref 26.0–34.0)
MCHC: 33.2 g/dL (ref 32.0–36.0)
MCV: 85.4 fL (ref 80.0–100.0)
Platelets: 287 10*3/uL (ref 150–440)
RBC: 4.33 MIL/uL (ref 3.80–5.20)
RDW: 15.1 % — ABNORMAL HIGH (ref 11.5–14.5)
WBC: 8.9 10*3/uL (ref 3.6–11.0)

## 2015-11-25 LAB — BASIC METABOLIC PANEL
Anion gap: 8 (ref 5–15)
BUN: 16 mg/dL (ref 6–20)
CO2: 27 mmol/L (ref 22–32)
Calcium: 9.4 mg/dL (ref 8.9–10.3)
Chloride: 101 mmol/L (ref 101–111)
Creatinine, Ser: 0.64 mg/dL (ref 0.44–1.00)
GFR calc Af Amer: >60 mL/min (ref >60)
GFR calc non Af Amer: >60 mL/min (ref >60)
Glucose, Bld: 142 mg/dL — ABNORMAL HIGH (ref 65–99)
Potassium: 4.2 mmol/L (ref 3.5–5.1)
Sodium: 136 mmol/L (ref 135–145)

## 2015-11-25 LAB — TROPONIN I: Troponin I: 0.03 ng/mL (ref <0.031)

## 2015-11-25 MED ORDER — IOHEXOL 240 MG/ML SOLN
25.0000 mL | Freq: Once | INTRAMUSCULAR | Status: AC | PRN
  Administered 2015-11-25: 25 mL via ORAL

## 2015-11-25 MED ORDER — ONDANSETRON 4 MG PO TBDP
ORAL_TABLET | ORAL | Status: AC
  Filled 2015-11-25: qty 1

## 2015-11-25 MED ORDER — IOHEXOL 300 MG/ML  SOLN
125.0000 mL | Freq: Once | INTRAMUSCULAR | Status: AC | PRN
  Administered 2015-11-25: 125 mL via INTRAVENOUS

## 2015-11-25 MED ORDER — ONDANSETRON 4 MG PO TBDP
4.0000 mg | ORAL_TABLET | Freq: Once | ORAL | Status: AC
  Administered 2015-11-25: 4 mg via ORAL

## 2015-11-25 NOTE — ED Notes (Signed)
Patient back from CT.

## 2015-11-25 NOTE — ED Provider Notes (Signed)
[facility] Emergency Department Provider Note  ____________________________________________  Time seen: Approximately 10:56 PM  I have reviewed the triage vital signs and the nursing notes.   HISTORY  Chief Complaint Constipation and Chest Pain    HPI Amy Barnes is a 65 y.o. female with a history of morbid obesity, to recent cataract surgeries, and a distant history of narcotic related constipation but has not taken narcotic medications more than a year.  She presents to the emergency department for evaluation of constipation for the last 5 days.  She states that she had a cataract surgery about 2 weeks ago which did not use general anesthesia but did use benzos for sedation.  She had constipation after that surgery which resolved "too much" with the use of Colace at home over the next couple of days.  She had her second cataract surgery 5 days ago and has been constipated since then.  She is afraid to use Colace again because of how much diarrhea it gave her last time.  She saw her primary care doctor 3 days ago who recommended she use MiraLAX but it has not been successful.  She describes her symptoms as severe and acute in onset after her surgery.  She denies any abdominal pain but says she has a feeling of bloating and occasional mild cramp.  She has been having nausea and "dry heaves".  She has decreased appetite as result of feeling bloated.  She is having occasional very small-volume mucousy stool that she describes as smaller than her little finger.  She tried using 2 rectal suppositories earlier tonight but they did not work either.  Of note, after she had waited for about 2 hours for an exam room, she reported that she also started to feel some chest pain which she describes as a heaviness, nonradiating, central chest, moderate, now resolved.   Past Medical History  Diagnosis Date  . Anemia   . Osteoarthritis   . Asthma   . Diabetes mellitus   . GERD  (gastroesophageal reflux disease)   . Allergic rhinitis   . Hypertension   . Bipolar disorder (HCC)     takes Synthroid meds for Bipolar  . Shortness of breath   . Sleep apnea     CPAP  . Depression     Patient Active Problem List   Diagnosis Date Noted  . Constipation 11/22/2015  . MRSA (methicillin resistant Staphylococcus aureus) infection 10/11/2012  . Left shoulder pain 10/11/2012  . Septic arthritis (HCC) 10/11/2012  . Right anterior knee pain 08/29/2012  . Urinary urgency 07/28/2012  . Leg pain 10/10/2011  . Morbid obesity with BMI of 60.0-69.9, adult (HCC) 05/13/2011  . Essential hypertension 05/17/2010  . HIP PAIN 12/25/2009  . CERVICAL RADICULOPATHY, RIGHT 12/25/2009  . BENIGN POSITIONAL VERTIGO 07/21/2008  . URINARY INCONTINENCE, MIXED 12/14/2007  . HYPERCHOLESTEROLEMIA 08/16/2007  . Hypothyroidism 07/30/2007  . DM type 2 with diabetic peripheral neuropathy (HCC) 07/30/2007  . ANEMIA-NOS 07/30/2007  . BIPOLAR AFFECTIVE DISORDER, DEPRESSED 07/30/2007  . ALLERGIC RHINITIS 07/30/2007  . Asthma 07/30/2007  . GERD 07/30/2007  . Osteoarthritis 07/30/2007  . SLEEP APNEA 07/30/2007    Past Surgical History  Procedure Laterality Date  . Total hip arthroplasty  2002    Left  . Partial hysterectomy  1984    for mennorhagia, ovaries remain  . Foot surgery  1982    bone spur  . Neck surgery      Herniated disk C2,3,4  . Tmj  arthroplasty  1982  . Hospitalized      for bipolar  . Carpal tunnel release  2002  . Cervical fusion  2012    C2/3/4  . Knee arthroscopy  09/01/2012    Procedure: ARTHROSCOPY KNEE;  Surgeon: Dannielle Huh, MD;  Location: [facility] OR;  Service: Orthopedics;  Laterality: Right;  . I&d extremity  09/06/2012    Procedure: IRRIGATION AND DEBRIDEMENT EXTREMITY;  Surgeon: Budd Palmer, MD;  Location: [facility] OR;  Service: Orthopedics;  Laterality: Right;  I&D of right thigh  . Nose surgery      Current Outpatient Rx  Name  Route  Sig  Dispense  Refill  .  albuterol (PROVENTIL HFA;VENTOLIN HFA) 108 (90 BASE) MCG/ACT inhaler   Inhalation   Inhale 2 puffs into the lungs every 6 (six) hours as needed. For shortness of breath         . aspirin 81 MG EC tablet   Oral   Take 81 mg by mouth daily.           Marland Kitchen Besifloxacin HCl (BESIVANCE) 0.6 % SUSP   Ophthalmic   Apply 1 drop to eye 3 (three) times daily.         . Bromfenac Sodium (PROLENSA) 0.07 % SOLN   Ophthalmic   Apply 1 drop to eye at bedtime.         . Calcium Carbonate-Vitamin D (CALCIUM + D PO)   Oral   Take 1 tablet by mouth daily.         . celecoxib (CELEBREX) 200 MG capsule   Oral   Take 1 capsule (200 mg total) by mouth 2 (two) times daily.   180 capsule   3   . clonazePAM (KLONOPIN) 0.5 MG tablet   Oral   Take 0.5 mg by mouth 2 (two) times daily as needed for anxiety. Reported on 11/05/2015         . cyclobenzaprine (FLEXERIL) 10 MG tablet   Oral   Take 1 tablet (10 mg total) by mouth 3 (three) times daily as needed. For muscle pain   30 tablet   0   . Difluprednate (DUREZOL) 0.05 % EMUL   Ophthalmic   Apply 1 drop to eye 3 (three) times daily.         Marland Kitchen doxycycline (DORYX) 100 MG EC tablet   Oral   Take 100 mg by mouth daily.          . Fluticasone-Salmeterol (ADVAIR DISKUS IN)   Inhalation   Inhale into the lungs as needed.         . furosemide (LASIX) 80 MG tablet   Oral   Take 0.5 tablets (40 mg total) by mouth 2 (two) times daily. For fluid   90 tablet   3   . gentamicin (GARAMYCIN) 0.3 % ophthalmic ointment   Left Eye   Place 1 application into the left eye 3 (three) times daily.         Marland Kitchen lamoTRIgine (LAMICTAL) 200 MG tablet   Oral   Take 200 mg by mouth daily.           Marland Kitchen levothyroxine (SYNTHROID, LEVOTHROID) 150 MCG tablet   Oral   Take 150 mcg by mouth daily.         . Liraglutide 18 MG/3ML SOLN   Subcutaneous   Inject 1.8 mg into the skin daily.         Marland Kitchen losartan (COZAAR) 50 MG tablet  Oral   Take 50  mg by mouth daily.         . metFORMIN (GLUCOPHAGE-XR) 500 MG 24 hr tablet   Oral   Take 2 tablets (1,000 mg total) by mouth daily with breakfast.   180 tablet   3   . Misc Natural Products (JOINT SUPPORT PO)   Oral   Take by mouth as needed.         Marland Kitchen MISC NATURAL PRODUCTS PO   Oral   Take 1 Package by mouth daily. Thrive Weight loss shake         . montelukast (SINGULAIR) 10 MG tablet   Oral   Take 10 mg by mouth at bedtime.         . Multiple Vitamins-Minerals (CENTRUM SILVER PO)   Oral   Take 1 tablet by mouth daily.         . ondansetron (ZOFRAN) 8 MG tablet   Oral   Take by mouth every 8 (eight) hours as needed.         . Oxcarbazepine (TRILEPTAL) 300 MG tablet   Oral   Take 300-600 mg by mouth 2 (two) times daily. 1 in the morning and 2 every evening         . pantoprazole (PROTONIX) 40 MG tablet   Oral   Take 1 tablet (40 mg total) by mouth daily.   90 tablet   3   . polyethylene glycol (MIRALAX / GLYCOLAX) packet   Oral   Take 17 g by mouth daily.         . simvastatin (ZOCOR) 40 MG tablet   Oral   Take 1 tablet (40 mg total) by mouth every evening.   90 tablet   3   . zaleplon (SONATA) 10 MG capsule   Oral   Take 10-20 mg by mouth at bedtime as needed for sleep.         Marland Kitchen Oxycodone HCl 10 MG TABS   Oral   Take 1 tablet (10 mg total) by mouth every 4 (four) hours as needed.   60 tablet   0     Allergies Cephalexin; Hydrocodone; Risperidone and related; Seroquel; and Sulfonamide derivatives  Family History  Problem Relation Age of Onset  . Aneurysm Father 62    brain  . Alcohol abuse Father   . CAD Other     several siblings  . Anesthesia problems Neg Hx   . Hypotension Neg Hx   . Malignant hyperthermia Neg Hx   . Pseudochol deficiency Neg Hx   . Cancer Brother     prostate  . Cancer Father     possibly  . Diabetes Brother     Social History Social History  Substance Use Topics  . Smoking status: Former  Smoker -- 1.50 packs/day    Types: Cigarettes    Quit date: 04/29/2009  . Smokeless tobacco: Never Used  . Alcohol Use: Yes     Comment: very rare    Review of Systems Constitutional: No fever/chills Eyes: No visual changes. ENT: No sore throat. Cardiovascular: Central chest heaviness that felt while she was waiting for an exam room, now resolved Respiratory: Denies shortness of breath. Gastrointestinal: Severe constipation for 5 days with abdominal bloating and occasional mild cramps Genitourinary: Negative for dysuria. Musculoskeletal: Negative for back pain. Skin: Negative for rash. Neurological: Negative for headaches, focal weakness or numbness.  10-point ROS otherwise negative.  ____________________________________________   PHYSICAL EXAM:  VITAL SIGNS:  ED Triage Vitals  Enc Vitals Group     BP 11/25/15 2057 152/54 mmHg     Pulse Rate 11/25/15 2057 90     Resp 11/25/15 2057 16     Temp 11/25/15 2057 97.6 F (36.4 C)     Temp Source 11/25/15 2057 Oral     SpO2 11/25/15 2057 98 %     Weight 11/25/15 2057 350 lb (158.759 kg)     Height 11/25/15 2057 5\' 4"  (1.626 m)     Head Cir --      Peak Flow --      Pain Score 11/25/15 2058 8     Pain Loc --      Pain Edu? --      Excl. in GC? --     Constitutional: Alert and oriented. Well appearing and in no acute distress. Eyes: Conjunctivae are normal. PERRL. EOMI. Head: Atraumatic. Nose: No congestion/rhinnorhea. Mouth/Throat: Mucous membranes are moist.  Oropharynx non-erythematous. Neck: No stridor.   Cardiovascular: Normal rate, regular rhythm. Grossly normal heart sounds.  Good peripheral circulation. Respiratory: Normal respiratory effort.  No retractions. Lungs CTAB. Gastrointestinal: Morbid obesity.  Soft with generalized tenderness to palpation throughout.  Distention difficult to assess due to body habitus.  I do not appreciate any bowel sounds on auscultation. Musculoskeletal: No lower extremity tenderness  nor edema.  No joint effusions. Neurologic:  Normal speech and language. No gross focal neurologic deficits are appreciated.  Skin:  Skin is warm, dry and intact. No rash noted. Psychiatric: Mood and affect are normal. Speech and behavior are normal.  ____________________________________________   LABS (all labs ordered are listed, but only abnormal results are displayed)  Labs Reviewed  BASIC METABOLIC PANEL - Abnormal; Notable for the following:    Glucose, Bld 142 (*)    All other components within normal limits  CBC - Abnormal; Notable for the following:    RDW 15.1 (*)    All other components within normal limits  TROPONIN I  TROPONIN I   ____________________________________________  EKG  ED ECG REPORT I, Damante Spragg, the attending physician, personally viewed and interpreted this ECG.  Date: 11/25/2015 EKG Time: 21:12 Rate: 83 Rhythm: normal sinus rhythm QRS Axis: Left axis deviation Intervals: normal.  LVH with slight widening of the QRS. ST/T Wave abnormalities: normal Conduction Disturbances: none Narrative Interpretation: Non-specific changes, but no evidence of acute ischemia.   ____________________________________________  RADIOLOGY   Ct Abdomen Pelvis W Contrast  11/26/2015  CLINICAL DATA:  Acute onset of generalized abdominal bloating and abdominal pain. No bowel movements for 6 days. Initial encounter. EXAM: CT ABDOMEN AND PELVIS WITH CONTRAST TECHNIQUE: Multidetector CT imaging of the abdomen and pelvis was performed using the standard protocol following bolus administration of intravenous contrast. CONTRAST:  OMNIPAQUE IOHEXOL 300 MG/ML  SOLN COMPARISON:  CT of the abdomen and pelvis, and right upper quadrant ultrasound, performed 09/25/2012 FINDINGS: The visualized lung bases are clear. Calcification is noted at the mitral valve. The liver and spleen are unremarkable in appearance. The gallbladder is within normal limits. The pancreas and adrenal  glands are unremarkable. The kidneys are unremarkable in appearance. There is no evidence of hydronephrosis. No renal or ureteral stones are seen. No perinephric stranding is appreciated. No free fluid is identified. The small bowel is unremarkable in appearance. The stomach is within normal limits. No acute vascular abnormalities are seen. Mild scattered calcification is noted along the abdominal aorta and its branches. The appendix is normal in caliber  and contains air, without evidence of appendicitis. Circumferential wall thickening is noted at the mid sigmoid colon, measuring 5-6 cm in length. There is more prominent stool proximal to this level. An underlying mass cannot be excluded. Sigmoidoscopy is recommended for further evaluation. The bladder is mildly distended and grossly unremarkable. The patient is status post hysterectomy. The ovaries are grossly unremarkable in appearance. No suspicious adnexal masses are seen. No inguinal lymphadenopathy is seen. No acute osseous abnormalities are identified. Several of the screws of the patient's left hip prosthesis are seen extending into the soft tissues. The patient is status post lumbar spinal fusion at L3-L4, with minimal grade 1 anterolisthesis of L3 on L4. IMPRESSION: 1. Circumferential wall thickening at the mid sigmoid colon, measuring 5-6 cm in length. More prominent stool noted proximal to this level. This raises concern for underlying mass. Sigmoidoscopy is recommended for further evaluation. 2. Mild calcification at the mitral valve. 3. Mild scattered calcification along the abdominal aorta and its branches. Electronically Signed   By: Roanna Raider M.D.   On: 11/26/2015 01:05    ____________________________________________   PROCEDURES  Procedure(s) performed: None  Critical Care performed: No ____________________________________________   INITIAL IMPRESSION / ASSESSMENT AND PLAN / ED COURSE  Pertinent labs & imaging results that  were available during my care of the patient were reviewed by me and considered in my medical decision making (see chart for details).  The patient is presenting with constipation which is likely secondary to her recent seizure was sedations.  She also seems to have a predisposition to constipation and also sounds as if she is quite sensitive to medications given the copious diarrhea she described after taking Colace after the last time she went through this.  However, she is diabetic and has no bowel sounds at this time with generalized tenderness to palpation with recent nausea and vomiting.  I am concerned about the possibility of ileus versus partial small bowel obstruction.  I discussed it with the patient and spouse and we decided to proceed with a CT scan of her abdomen and pelvis to rule out a more serious cause of her constipation.  ----------------------------------------- 2:55 AM on 11/26/2015 -----------------------------------------  CT scan was notable for an area of circumferential thickening.  This is nonspecific although a mass cannot be excluded.  I spoke by phone with Dr. Shelle Iron who will have his office staff follow-up with the patient to schedule an outpatient clinic visit and schedule a sigmoidoscopy for further evaluation.  I counseled the patient about the abnormal finding explained is nonspecific at this time.  We are proceeding with an enema to see if we can give her some relief of her constipation.  ----------------------------------------- 5:40 AM on 11/26/2015 -----------------------------------------  The patient had very minimal success with her enema.  She continues to have intermittent mild abdominal cramping.  We discussed various other options including admission, but she felt she would rather take oral stool softeners at home rather than being uncomfortable here or in the hospital.  I am giving her a bottle of magnesium citrate to drink when she gets home.  I also  advised the use of Colace and senna.  She will follow up with Dr. Shelle Iron and I gave her my usual and customary return precautions.  Her vital signs remained stable and she is afebrile.  She has not had any additional chest discomfort which I suspect was likely a result of her abdominal distention and discomfort while awaiting an exam room. ____________________________________________  FINAL CLINICAL IMPRESSION(S) / ED DIAGNOSES  Final diagnoses:  Constipation, unspecified constipation type      NEW MEDICATIONS STARTED DURING THIS VISIT:  New Prescriptions   No medications on file      Note:  This document was prepared using Dragon voice recognition software and may include unintentional dictation errors.   Loleta Rose, MD 11/26/15 [phone]

## 2015-11-25 NOTE — ED Notes (Signed)
Patient transported to CT 

## 2015-11-25 NOTE — ED Notes (Signed)
Pt states is now having chest pain.

## 2015-11-25 NOTE — ED Notes (Signed)
Pt states in constipated. Pt states last bowel movement  On Tuesday. Pt states is nauseated, but has no vomiting. Pt states "i've taken miralax and suppositories but nothing is happening".

## 2015-11-26 ENCOUNTER — Encounter: Payer: Self-pay | Admitting: Emergency Medicine

## 2015-11-26 LAB — TROPONIN I: Troponin I: 0.03 ng/mL (ref <0.031)

## 2015-11-26 MED ORDER — MAGNESIUM CITRATE PO SOLN
1.0000 | Freq: Once | ORAL | Status: AC
  Administered 2015-11-26: 1 via ORAL
  Filled 2015-11-26: qty 296

## 2015-11-26 NOTE — ED Notes (Signed)
Reviewed d/c instructions, follow-up care, need to increase fluids/fiber, and OTC stool softeners. Pt verbalized understanding.

## 2015-11-26 NOTE — ED Notes (Signed)
Pt had small loose stool after enema. Pt reports slight decrease in pain

## 2015-11-26 NOTE — Discharge Instructions (Signed)
You were seen in the emergency department today for constipation and abdominal discomfort.  Your evaluation was reassuring except for an area of your colon that is thickened.  As we discussed, you need to follow up with a GI doctor, such as Dr. Shelle Iron, at the next available opportunity and they will likely perform a colonoscopy or sigmoidoscopy to further evaluate the area.  In the meantime, we recommend that you use one or more of the following over-the-counter medications in the order described:   1)  Colace (or Dulcolax) 100 mg:  This is a stool softener, and you may take it once or twice a day as needed. 2)  Senna tablets:  This is a bowel stimulant that will help "push" out your stool. It is the next step to add after you have tried a stool softener. 3)  Miralax (powder):  This medication works by drawing additional fluid into your intestines and helps to flush out your stool.  Mix the powder with water or juice according to label instructions.  It may help if the Colace and Senna are not sufficient, but you must be sure to use the recommended amount of water or juice when you mix up the powder. Remember that narcotic pain medications are constipating, so avoid them or minimize their use.  Drink plenty of fluids.  Please return to the Emergency Department immediately if you develop new or worsening symptoms that concern you, such as (but not limited to) fever > 101 degrees, severe abdominal pain, or persistent vomiting.   Constipation Constipation is when a person has fewer than three bowel movements a week, has difficulty having a bowel movement, or has stools that are dry, hard, or larger than normal. As people grow older, constipation is more common. If you try to fix constipation with medicines that make you have a bowel movement (laxatives), the problem may get worse. Long-term laxative use may cause the muscles of the colon to become weak. A low-fiber diet, not taking in enough fluids, and  taking certain medicines may make constipation worse.  CAUSES   Certain medicines, such as antidepressants, pain medicine, iron supplements, antacids, and water pills.   Certain diseases, such as diabetes, irritable bowel syndrome (IBS), thyroid disease, or depression.   Not drinking enough water.   Not eating enough fiber-rich foods.   Stress or travel.   Lack of physical activity or exercise.   Ignoring the urge to have a bowel movement.   Using laxatives too much.  SIGNS AND SYMPTOMS   Having fewer than three bowel movements a week.   Straining to have a bowel movement.   Having stools that are hard, dry, or larger than normal.   Feeling full or bloated.   Pain in the lower abdomen.   Not feeling relief after having a bowel movement.  DIAGNOSIS  Your health care provider will take a medical history and perform a physical exam. Further testing may be done for severe constipation. Some tests may include:  A barium enema X-ray to examine your rectum, colon, and, sometimes, your small intestine.   A sigmoidoscopy to examine your lower colon.   A colonoscopy to examine your entire colon. TREATMENT  Treatment will depend on the severity of your constipation and what is causing it. Some dietary treatments include drinking more fluids and eating more fiber-rich foods. Lifestyle treatments may include regular exercise. If these diet and lifestyle recommendations do not help, your health care provider may recommend taking over-the-counter laxative medicines  to help you have bowel movements. Prescription medicines may be prescribed if over-the-counter medicines do not work.  HOME CARE INSTRUCTIONS   Eat foods that have a lot of fiber, such as fruits, vegetables, whole grains, and beans.  Limit foods high in fat and processed sugars, such as french fries, hamburgers, cookies, candies, and soda.   A fiber supplement may be added to your diet if you cannot get  enough fiber from foods.   Drink enough fluids to keep your urine clear or pale yellow.   Exercise regularly or as directed by your health care provider.   Go to the restroom when you have the urge to go. Do not hold it.   Only take over-the-counter or prescription medicines as directed by your health care provider. Do not take other medicines for constipation without talking to your health care provider first.  SEEK IMMEDIATE MEDICAL CARE IF:   You have bright red blood in your stool.   Your constipation lasts for more than 4 days or gets worse.   You have abdominal or rectal pain.   You have thin, pencil-like stools.   You have unexplained weight loss. MAKE SURE YOU:   Understand these instructions.  Will watch your condition.  Will get help right away if you are not doing well or get worse. Document Released: 06/13/2004 Document Revised: 09/20/2013 Document Reviewed: 06/27/2013 [facility] Patient Information 2015 [place], [place]. This information is not intended to replace advice given to you by your health care provider. Make sure you discuss any questions you have with your health care provider.

## 2015-11-27 ENCOUNTER — Emergency Department: Payer: BLUE CROSS/BLUE SHIELD

## 2015-11-27 ENCOUNTER — Encounter: Payer: Self-pay | Admitting: Emergency Medicine

## 2015-11-27 ENCOUNTER — Inpatient Hospital Stay
Admission: EM | Admit: 2015-11-27 | Discharge: 2015-11-29 | DRG: 392 | Disposition: A | Discharge status: Home / Self Care | Payer: BLUE CROSS/BLUE SHIELD | Attending: Internal Medicine | Admitting: Internal Medicine

## 2015-11-27 DIAGNOSIS — Z8042 Family history of malignant neoplasm of prostate: Secondary | ICD-10-CM

## 2015-11-27 DIAGNOSIS — Z809 Family history of malignant neoplasm, unspecified: Secondary | ICD-10-CM | POA: Diagnosis not present

## 2015-11-27 DIAGNOSIS — Z90710 Acquired absence of both cervix and uterus: Secondary | ICD-10-CM

## 2015-11-27 DIAGNOSIS — K5900 Constipation, unspecified: Secondary | ICD-10-CM

## 2015-11-27 DIAGNOSIS — Z885 Allergy status to narcotic agent status: Secondary | ICD-10-CM

## 2015-11-27 DIAGNOSIS — G4733 Obstructive sleep apnea (adult) (pediatric): Secondary | ICD-10-CM | POA: Diagnosis present

## 2015-11-27 DIAGNOSIS — Z7982 Long term (current) use of aspirin: Secondary | ICD-10-CM | POA: Diagnosis not present

## 2015-11-27 DIAGNOSIS — Z882 Allergy status to sulfonamides status: Secondary | ICD-10-CM | POA: Diagnosis not present

## 2015-11-27 DIAGNOSIS — Z8249 Family history of ischemic heart disease and other diseases of the circulatory system: Secondary | ICD-10-CM

## 2015-11-27 DIAGNOSIS — Z811 Family history of alcohol abuse and dependence: Secondary | ICD-10-CM

## 2015-11-27 DIAGNOSIS — Z9889 Other specified postprocedural states: Secondary | ICD-10-CM | POA: Diagnosis not present

## 2015-11-27 DIAGNOSIS — J45909 Unspecified asthma, uncomplicated: Secondary | ICD-10-CM | POA: Diagnosis present

## 2015-11-27 DIAGNOSIS — E1142 Type 2 diabetes mellitus with diabetic polyneuropathy: Secondary | ICD-10-CM | POA: Diagnosis present

## 2015-11-27 DIAGNOSIS — R109 Unspecified abdominal pain: Secondary | ICD-10-CM | POA: Diagnosis present

## 2015-11-27 DIAGNOSIS — Z79899 Other long term (current) drug therapy: Secondary | ICD-10-CM

## 2015-11-27 DIAGNOSIS — Z87891 Personal history of nicotine dependence: Secondary | ICD-10-CM | POA: Diagnosis not present

## 2015-11-27 DIAGNOSIS — Z833 Family history of diabetes mellitus: Secondary | ICD-10-CM

## 2015-11-27 DIAGNOSIS — E039 Hypothyroidism, unspecified: Secondary | ICD-10-CM | POA: Diagnosis present

## 2015-11-27 DIAGNOSIS — K589 Irritable bowel syndrome without diarrhea: Secondary | ICD-10-CM | POA: Diagnosis present

## 2015-11-27 DIAGNOSIS — I10 Essential (primary) hypertension: Secondary | ICD-10-CM | POA: Diagnosis present

## 2015-11-27 DIAGNOSIS — K5792 Diverticulitis of intestine, part unspecified, without perforation or abscess without bleeding: Principal | ICD-10-CM | POA: Diagnosis present

## 2015-11-27 DIAGNOSIS — Z86010 Personal history of colonic polyps: Secondary | ICD-10-CM | POA: Diagnosis not present

## 2015-11-27 DIAGNOSIS — Z981 Arthrodesis status: Secondary | ICD-10-CM | POA: Diagnosis not present

## 2015-11-27 DIAGNOSIS — Z96642 Presence of left artificial hip joint: Secondary | ICD-10-CM | POA: Diagnosis present

## 2015-11-27 DIAGNOSIS — M1990 Unspecified osteoarthritis, unspecified site: Secondary | ICD-10-CM | POA: Diagnosis present

## 2015-11-27 DIAGNOSIS — K219 Gastro-esophageal reflux disease without esophagitis: Secondary | ICD-10-CM | POA: Diagnosis present

## 2015-11-27 DIAGNOSIS — Z888 Allergy status to other drugs, medicaments and biological substances status: Secondary | ICD-10-CM

## 2015-11-27 DIAGNOSIS — Z9989 Dependence on other enabling machines and devices: Secondary | ICD-10-CM

## 2015-11-27 DIAGNOSIS — R112 Nausea with vomiting, unspecified: Secondary | ICD-10-CM | POA: Diagnosis present

## 2015-11-27 LAB — COMPREHENSIVE METABOLIC PANEL
ALT: 24 U/L (ref 14–54)
AST: 28 U/L (ref 15–41)
Albumin: 4.5 g/dL (ref 3.5–5.0)
Alkaline Phosphatase: 89 U/L (ref 38–126)
Anion gap: 11 (ref 5–15)
BUN: 14 mg/dL (ref 6–20)
CO2: 28 mmol/L (ref 22–32)
Calcium: 10.1 mg/dL (ref 8.9–10.3)
Chloride: 100 mmol/L — ABNORMAL LOW (ref 101–111)
Creatinine, Ser: 0.69 mg/dL (ref 0.44–1.00)
GFR calc Af Amer: >60 mL/min (ref >60)
GFR calc non Af Amer: >60 mL/min (ref >60)
Glucose, Bld: 153 mg/dL — ABNORMAL HIGH (ref 65–99)
Potassium: 3.7 mmol/L (ref 3.5–5.1)
Sodium: 139 mmol/L (ref 135–145)
Total Bilirubin: 0.7 mg/dL (ref 0.3–1.2)
Total Protein: 7.9 g/dL (ref 6.5–8.1)

## 2015-11-27 LAB — CBC
HCT: 37.5 % (ref 35.0–47.0)
Hemoglobin: 12.4 g/dL (ref 12.0–16.0)
MCH: 27.8 pg (ref 26.0–34.0)
MCHC: 33.2 g/dL (ref 32.0–36.0)
MCV: 83.9 fL (ref 80.0–100.0)
Platelets: 319 10*3/uL (ref 150–440)
RBC: 4.47 MIL/uL (ref 3.80–5.20)
RDW: 15.4 % — ABNORMAL HIGH (ref 11.5–14.5)
WBC: 9.7 10*3/uL (ref 3.6–11.0)

## 2015-11-27 LAB — LIPASE, BLOOD: Lipase: 30 U/L (ref 11–51)

## 2015-11-27 MED ORDER — IOHEXOL 300 MG/ML  SOLN
100.0000 mL | Freq: Once | INTRAMUSCULAR | Status: AC | PRN
  Administered 2015-11-27: 100 mL via INTRAVENOUS

## 2015-11-27 MED ORDER — SODIUM CHLORIDE 0.9 % IV BOLUS (SEPSIS)
1000.0000 mL | Freq: Once | INTRAVENOUS | Status: AC
  Administered 2015-11-27: 1000 mL via INTRAVENOUS

## 2015-11-27 MED ORDER — METOCLOPRAMIDE HCL 5 MG/ML IJ SOLN
10.0000 mg | Freq: Once | INTRAMUSCULAR | Status: AC
  Administered 2015-11-27: 10 mg via INTRAVENOUS
  Filled 2015-11-27: qty 2

## 2015-11-27 MED ORDER — ONDANSETRON HCL 4 MG/2ML IJ SOLN
4.0000 mg | Freq: Once | INTRAMUSCULAR | Status: AC
  Administered 2015-11-27: 4 mg via INTRAVENOUS
  Filled 2015-11-27: qty 2

## 2015-11-27 MED ORDER — MAGNESIUM CITRATE PO SOLN
0.5000 | Freq: Once | ORAL | Status: AC
  Administered 2015-11-27: 0.5 via ORAL
  Filled 2015-11-27: qty 296

## 2015-11-27 MED ORDER — ONDANSETRON 4 MG PO TBDP
ORAL_TABLET | ORAL | Status: AC
  Filled 2015-11-27: qty 1

## 2015-11-27 MED ORDER — SENNA 8.6 MG PO TABS
2.0000 | ORAL_TABLET | ORAL | Status: AC
Start: 2015-11-27 — End: 2015-11-28
  Filled 2015-11-27: qty 2

## 2015-11-27 MED ORDER — HYDROMORPHONE HCL 1 MG/ML IJ SOLN
1.0000 mg | Freq: Once | INTRAMUSCULAR | Status: AC
Start: 2015-11-27 — End: 2015-11-27
  Administered 2015-11-27: 1 mg via INTRAVENOUS
  Filled 2015-11-27: qty 1

## 2015-11-27 MED ORDER — HYDROMORPHONE HCL 1 MG/ML IJ SOLN
1.0000 mg | Freq: Once | INTRAMUSCULAR | Status: AC
  Administered 2015-11-27: 1 mg via INTRAVENOUS
  Filled 2015-11-27: qty 1

## 2015-11-27 MED ORDER — POLYETHYLENE GLYCOL 3350 17 G PO PACK
34.0000 g | PACK | ORAL | Status: AC
  Administered 2015-11-27: 34 g via ORAL
  Filled 2015-11-27: qty 2

## 2015-11-27 MED ORDER — ONDANSETRON 4 MG PO TBDP
4.0000 mg | ORAL_TABLET | Freq: Once | ORAL | Status: AC
  Administered 2015-11-27: 4 mg via ORAL

## 2015-11-27 MED ORDER — IOHEXOL 240 MG/ML SOLN
25.0000 mL | Freq: Once | INTRAMUSCULAR | Status: DC | PRN
  Administered 2015-11-27: 25 mL via ORAL
  Filled 2015-11-27: qty 50

## 2015-11-27 MED ORDER — ONDANSETRON HCL 4 MG/2ML IJ SOLN: 4.0000 mg | Freq: Once | INTRAMUSCULAR | Status: DC | PRN

## 2015-11-27 NOTE — H&P (Signed)
[facility] - [place] at [facility]   PATIENT NAME: Amy Barnes    MR#:  [id number]  DATE OF BIRTH:  1951-08-20  DATE OF ADMISSION:  11/27/2015  PRIMARY CARE PHYSICIAN: [name], MD   REQUESTING/REFERRING PHYSICIAN: Scotty Court, M.D.  CHIEF COMPLAINT:   Chief Complaint  Patient presents with  . Abdominal Pain  . Emesis    HISTORY OF PRESENT ILLNESS:  Amy Barnes  is a 65 y.o. female who presents with persistent nausea and vomiting for the past 4 days. Patient has been having left lower quadrant pain, which is worst with bowel movements for the past several days. Is been accompanied by nausea and vomiting as well. She came to the ED for evaluation and workup was largely negative except for some indeterminate sigmoid thickening on CT abdomen and pelvis. She has had some long-standing history with recurrent constipation, and felt that this might have been the cause of her symptoms initially. She was sent home with treatment for the same. However since that time she has had persistent nausea and vomiting, and abdominal pain. She came back in the ED today after seeing gastroenterology in their outpatient office due to the fact that they felt she likely needed IV fluids and pain control. Repeat workup in the ED today again showed some sigmoid thickening. The rest of her workup was largely negative. However, her pain and nausea and vomiting persisted despite several rounds of pain medicine and antiemetics. Hospitalists were called for admission.  PAST MEDICAL HISTORY:   Past Medical History  Diagnosis Date  . Anemia   . Osteoarthritis   . Asthma   . Diabetes mellitus   . GERD (gastroesophageal reflux disease)   . Allergic rhinitis   . Hypertension   . Bipolar disorder (HCC)     takes Synthroid meds for Bipolar  . Shortness of breath   . Sleep apnea     CPAP  . Depression     PAST SURGICAL HISTORY:   Past Surgical History  Procedure Laterality Date   . Total hip arthroplasty  2002    Left  . Partial hysterectomy  1984    for mennorhagia, ovaries remain  . Foot surgery  1982    bone spur  . Neck surgery      Herniated disk C2,3,4  . Tmj arthroplasty  1982  . Hospitalized      for bipolar  . Carpal tunnel release  2002  . Cervical fusion  2012    C2/3/4  . Knee arthroscopy  09/01/2012    Procedure: ARTHROSCOPY KNEE;  Surgeon: Dannielle Huh, MD;  Location: [facility] OR;  Service: Orthopedics;  Laterality: Right;  . I&d extremity  09/06/2012    Procedure: IRRIGATION AND DEBRIDEMENT EXTREMITY;  Surgeon: Budd Palmer, MD;  Location: [facility] OR;  Service: Orthopedics;  Laterality: Right;  I&D of right thigh  . Nose surgery      SOCIAL HISTORY:   Social History  Substance Use Topics  . Smoking status: Former Smoker -- 1.50 packs/day    Types: Cigarettes    Quit date: 04/29/2009  . Smokeless tobacco: Never Used  . Alcohol Use: Yes     Comment: very rare    FAMILY HISTORY:   Family History  Problem Relation Age of Onset  . Aneurysm Father 62    brain  . Alcohol abuse Father   . CAD Other     several siblings  . Anesthesia problems Neg Hx   . Hypotension  Neg Hx   . Malignant hyperthermia Neg Hx   . Pseudochol deficiency Neg Hx   . Cancer Brother     prostate  . Cancer Father     possibly  . Diabetes Brother     DRUG ALLERGIES:   Allergies  Allergen Reactions  . Cephalexin Hives  . Hydrocodone Other (See Comments)    Reaction:  Hallucinations   . Risperidone And Related Other (See Comments)    Reaction:  Unknown   . Seroquel [Quetiapine Fumarate] Other (See Comments)    Reaction:  Made pt excessively sleepy  . Sulfa Antibiotics Rash    MEDICATIONS AT HOME:   Prior to Admission medications   Medication Sig Start Date End Date Taking? Authorizing Provider  albuterol (PROVENTIL HFA;VENTOLIN HFA) 108 (90 BASE) MCG/ACT inhaler Inhale 2 puffs into the lungs every 6 (six) hours as needed for wheezing or shortness of  breath.    Yes Historical Provider, MD  aspirin 81 MG EC tablet Take 81 mg by mouth at bedtime.    Yes Historical Provider, MD  Besifloxacin HCl (BESIVANCE) 0.6 % SUSP Place 1 drop into the left eye 3 (three) times daily.    Yes Historical Provider, MD  Bromfenac Sodium (PROLENSA) 0.07 % SOLN Place 1 drop into both eyes at bedtime.    Yes Historical Provider, MD  Calcium Carbonate-Vitamin D (CALCIUM 600+D) 600-400 MG-UNIT tablet Take 1 tablet by mouth daily.   Yes Historical Provider, MD  celecoxib (CELEBREX) 200 MG capsule Take 1 capsule (200 mg total) by mouth 2 (two) times daily. 11/05/15  Yes [name], MD  Difluprednate (DUREZOL) 0.05 % EMUL Place 1 drop into the left eye 3 (three) times daily.    Yes Historical Provider, MD  doxycycline (VIBRA-TABS) 100 MG tablet Take 100 mg by mouth at bedtime.   Yes Historical Provider, MD  Fluticasone-Salmeterol (ADVAIR) 250-50 MCG/DOSE AEPB Inhale 1 puff into the lungs 2 (two) times daily.   Yes Historical Provider, MD  furosemide (LASIX) 40 MG tablet Take 40 mg by mouth 2 (two) times daily.   Yes Historical Provider, MD  gentamicin (GARAMYCIN) 0.3 % ophthalmic ointment Place 1 application into the left eye 3 (three) times daily.   Yes Historical Provider, MD  glucosamine-chondroitin 500-400 MG tablet Take 1 tablet by mouth 2 (two) times daily.   Yes Historical Provider, MD  lamoTRIgine (LAMICTAL) 200 MG tablet Take 200 mg by mouth daily.     Yes Historical Provider, MD  levothyroxine (SYNTHROID, LEVOTHROID) 150 MCG tablet Take 150 mcg by mouth daily before breakfast.    Yes Historical Provider, MD  Liraglutide (VICTOZA) 18 MG/3ML SOPN Inject 1.8 mg into the skin daily.   Yes Historical Provider, MD  losartan (COZAAR) 50 MG tablet Take 50 mg by mouth daily.   Yes Historical Provider, MD  metFORMIN (GLUCOPHAGE-XR) 500 MG 24 hr tablet Take 2 tablets (1,000 mg total) by mouth daily with breakfast. 11/05/15  Yes [name], MD  montelukast (SINGULAIR)  10 MG tablet Take 10 mg by mouth at bedtime.   Yes Amy Michelle Nasuti, MD  Multiple Vitamin (MULTIVITAMIN WITH MINERALS) TABS tablet Take 1 tablet by mouth daily.   Yes Historical Provider, MD  ondansetron (ZOFRAN) 8 MG tablet Take 8 mg by mouth every 8 (eight) hours as needed for nausea or vomiting.    Yes Historical Provider, MD  Oxcarbazepine (TRILEPTAL) 300 MG tablet Take 300-600 mg by mouth 2 (two) times daily. Pt takes one tablet in the  morning and two in the evening.   Yes Historical Provider, MD  pantoprazole (PROTONIX) 40 MG tablet Take 1 tablet (40 mg total) by mouth daily. 11/05/15  Yes [name], MD  polyethylene glycol [facility] / GLYCOLAX) packet Take 17 g by mouth daily as needed for mild constipation.    Yes Historical Provider, MD  simvastatin (ZOCOR) 40 MG tablet Take 1 tablet (40 mg total) by mouth every evening. 11/05/15  Yes [name], MD  zaleplon (SONATA) 10 MG capsule Take 10-20 mg by mouth at bedtime as needed for sleep.   Yes Historical Provider, MD    REVIEW OF SYSTEMS:  Review of Systems  Constitutional: Negative for fever, chills, weight loss and malaise/fatigue.  HENT: Negative for ear pain, hearing loss and tinnitus.   Eyes: Negative for blurred vision, double vision, pain and redness.  Respiratory: Negative for cough, hemoptysis and shortness of breath.   Cardiovascular: Negative for chest pain, palpitations, orthopnea and leg swelling.  Gastrointestinal: Positive for nausea, vomiting and abdominal pain. Negative for diarrhea and constipation.  Genitourinary: Negative for dysuria, frequency and hematuria.  Musculoskeletal: Negative for back pain, joint pain and neck pain.  Skin:       No acne, rash, or lesions  Neurological: Negative for dizziness, tremors, focal weakness and weakness.  Endo/Heme/Allergies: Negative for polydipsia. Does not bruise/bleed easily.  Psychiatric/Behavioral: Negative for depression. The patient is not nervous/anxious and does not  have insomnia.      VITAL SIGNS:   Filed Vitals:   11/27/15 1845 11/27/15 2143 11/27/15 2151 11/27/15 2200  BP:  141/83  137/59  Pulse: 81 87 92 84  Temp:  97.3 F (36.3 C)    TempSrc:  Oral    Resp:  18    Height:      Weight:      SpO2: 95% 97% 93% 93%   Wt Readings from Last 3 Encounters:  11/27/15 158.759 kg (350 lb)  11/25/15 158.759 kg (350 lb)  11/22/15 158.419 kg (349 lb 4 oz)    PHYSICAL EXAMINATION:  Physical Exam  Vitals reviewed. Constitutional: She is oriented to person, place, and time. She appears well-developed and well-nourished. No distress.  HENT:  Head: Normocephalic and atraumatic.  Mouth/Throat: Oropharynx is clear and moist.  Eyes: Conjunctivae and EOM are normal. Pupils are equal, round, and reactive to light. No scleral icterus.  Neck: Normal range of motion. Neck supple. No JVD present. No thyromegaly present.  Cardiovascular: Normal rate, regular rhythm and intact distal pulses.  Exam reveals no gallop and no friction rub.   No murmur heard. Respiratory: Effort normal and breath sounds normal. No respiratory distress. She has no wheezes. She has no rales.  GI: Soft. Bowel sounds are normal. She exhibits no distension. There is tenderness (low abdomen, worst in left lower quadrant).  Musculoskeletal: Normal range of motion. She exhibits no edema.  No arthritis, no gout  Lymphadenopathy:    She has no cervical adenopathy.  Neurological: She is alert and oriented to person, place, and time. No cranial nerve deficit.  No dysarthria, no aphasia  Skin: Skin is warm and dry. No rash noted. No erythema.  Psychiatric: She has a normal mood and affect. Her behavior is normal. Judgment and thought content normal.    LABORATORY PANEL:   CBC  Recent Labs Lab 11/27/15 1251  WBC 9.7  HGB 12.4  HCT 37.5  PLT 319   ------------------------------------------------------------------------------------------------------------------  Chemistries    Recent Labs Lab 11/27/15 1251  NA 139  K 3.7  CL 100*  CO2 28  GLUCOSE 153*  BUN 14  CREATININE 0.69  CALCIUM 10.1  AST 28  ALT 24  ALKPHOS 89  BILITOT 0.7   ------------------------------------------------------------------------------------------------------------------  Cardiac Enzymes  Recent Labs Lab 11/26/15 0014  TROPONINI 0.03   ------------------------------------------------------------------------------------------------------------------  RADIOLOGY:  Ct Abdomen Pelvis W Contrast  11/27/2015  CLINICAL DATA:  65 year old female with generalized abdominal cramping and constipation, with some vomiting and poor PO intake. EXAM: CT ABDOMEN AND PELVIS WITH CONTRAST TECHNIQUE: Multidetector CT imaging of the abdomen and pelvis was performed using the standard protocol following bolus administration of intravenous contrast. CONTRAST:  OMNIPAQUE IOHEXOL 300 MG/ML  SOLN COMPARISON:  CT the abdomen and pelvis 11/25/2015. FINDINGS: Lower chest: Calcifications of the mitral annulus. Lipomatous hypertrophy of the interatrial septum. Hepatobiliary: No cystic or solid hepatic lesions. No intra or extrahepatic biliary ductal dilatation. Amorphous high attenuation material lying dependently in the gallbladder, new compared to the recent prior examination, presumably vicarious excretion of contrast material from the recent prior CT examination. No findings to suggest an acute cholecystitis at this time. Pancreas: No pancreatic mass. No pancreatic ductal dilatation. No pancreatic or peripancreatic fluid or inflammatory changes. Spleen: Unremarkable. Adrenals/Urinary Tract: Bilateral adrenal glands and bilateral kidneys are normal in appearance. No hydroureteronephrosis. Urinary bladder is normal in appearance. Stomach/Bowel: Normal appearance of the stomach. No pathologic dilatation of small bowel or colon. There is again some mural thickening in the mid sigmoid colon, however, this is  poorly demonstrated on today's study secondary to decompression of this loop of bowel, and extensive beam hardening artifact from the patient's left hip arthroplasty. Normal appendix. Vascular/Lymphatic: Atherosclerosis throughout the abdominal and pelvic vasculature, without evidence of aneurysm or dissection. No lymphadenopathy noted in the abdomen or pelvis. Reproductive: Status post hysterectomy.  Ovaries are atrophic. Other: Small umbilical hernia containing only omental fat is unchanged. No significant volume of ascites. No pneumoperitoneum. Musculoskeletal: Status post PLIF at L3-L4 with an interbody graft at the L3-L4 interspace. Status post left total hip arthroplasty. Large lucent area in the left ilium adjacent to the acetabulum, potentially sequela of particle disease. There are no aggressive appearing lytic or blastic lesions noted in the visualized portions of the skeleton. IMPRESSION: 1. No acute findings in the abdomen or pelvis to account for the patient's symptoms. 2. Focal area of mural thickening in the mid sigmoid colon suspected on the prior study is less well demonstrated on today's examination secondary to under distention of this portion of the colon and obscuration by beam hardening artifact from the patient's left hip arthroplasty. As previously suggested, followup nonemergent colonoscopy is recommended in the near future to exclude underlying neoplasm. 3. Small umbilical hernia containing only omental fat. 4. Atherosclerosis. 5. Additional incidental findings, as above. Electronically Signed   By: Trudie Reed M.D.   On: 11/27/2015 15:44   Ct Abdomen Pelvis W Contrast  11/26/2015  CLINICAL DATA:  Acute onset of generalized abdominal bloating and abdominal pain. No bowel movements for 6 days. Initial encounter. EXAM: CT ABDOMEN AND PELVIS WITH CONTRAST TECHNIQUE: Multidetector CT imaging of the abdomen and pelvis was performed using the standard protocol following bolus  administration of intravenous contrast. CONTRAST:  OMNIPAQUE IOHEXOL 300 MG/ML  SOLN COMPARISON:  CT of the abdomen and pelvis, and right upper quadrant ultrasound, performed 09/25/2012 FINDINGS: The visualized lung bases are clear. Calcification is noted at the mitral valve. The liver and spleen are unremarkable in appearance. The gallbladder  is within normal limits. The pancreas and adrenal glands are unremarkable. The kidneys are unremarkable in appearance. There is no evidence of hydronephrosis. No renal or ureteral stones are seen. No perinephric stranding is appreciated. No free fluid is identified. The small bowel is unremarkable in appearance. The stomach is within normal limits. No acute vascular abnormalities are seen. Mild scattered calcification is noted along the abdominal aorta and its branches. The appendix is normal in caliber and contains air, without evidence of appendicitis. Circumferential wall thickening is noted at the mid sigmoid colon, measuring 5-6 cm in length. There is more prominent stool proximal to this level. An underlying mass cannot be excluded. Sigmoidoscopy is recommended for further evaluation. The bladder is mildly distended and grossly unremarkable. The patient is status post hysterectomy. The ovaries are grossly unremarkable in appearance. No suspicious adnexal masses are seen. No inguinal lymphadenopathy is seen. No acute osseous abnormalities are identified. Several of the screws of the patient's left hip prosthesis are seen extending into the soft tissues. The patient is status post lumbar spinal fusion at L3-L4, with minimal grade 1 anterolisthesis of L3 on L4. IMPRESSION: 1. Circumferential wall thickening at the mid sigmoid colon, measuring 5-6 cm in length. More prominent stool noted proximal to this level. This raises concern for underlying mass. Sigmoidoscopy is recommended for further evaluation. 2. Mild calcification at the mitral valve. 3. Mild scattered  calcification along the abdominal aorta and its branches. Electronically Signed   By: Roanna Raider M.D.   On: 11/26/2015 01:05    EKG:   Orders placed or performed during the hospital encounter of 11/25/15  . ED EKG within 10 minutes  . ED EKG within 10 minutes  . EKG 12-Lead  . EKG 12-Lead  . EKG    IMPRESSION AND PLAN:  Principal Problem:   Intractable nausea and vomiting - Will admit for workup for the nausea and vomiting and abdominal pain. GI consult for the same, question need for sigmoidoscopy to further evaluate this CT finding. Antiemetics on board. Active Problems:   Abdominal pain - pain control on board and further workup as above.   DM type 2 with diabetic peripheral neuropathy (HCC) - sliding scale insulin with corresponding glucose checks every 6 hours while patient is nothing by mouth.   Essential hypertension - continue home meds, utilize IV when necessary antihypertensives for blood pressure goal less than 160/100   OSA on CPAP - daily at bedtime CPAP   Hypothyroidism - home dose thyroid replacement   GERD - home dose PPI  All the records are reviewed and case discussed with ED provider. Management plans discussed with the patient and/or family.  DVT PROPHYLAXIS: SubQ lovenox  GI PROPHYLAXIS: PPI  ADMISSION STATUS: Inpatient  CODE STATUS: Full Code Status History    This patient does not have a recorded code status. Please follow your organizational policy for patients in this situation.      TOTAL TIME TAKING CARE OF THIS PATIENT: 45 minutes.    Elfie Costanza FIELDING 11/27/2015, 11:42 PM  [facility]  Office  [phone]  CC: Primary care physician; [name], MD

## 2015-11-27 NOTE — ED Notes (Signed)
Attempted to start PIV x 3.  Unsuccessful.

## 2015-11-27 NOTE — ED Notes (Signed)
Ct notified that pt has finished contrast 

## 2015-11-27 NOTE — ED Provider Notes (Signed)
[facility] Emergency Department Provider Note  ____________________________________________  Time seen: 12:45 PM  I have reviewed the triage vital signs and the nursing notes.   HISTORY  Chief Complaint Abdominal Pain and Emesis    HPI Amy Barnes is a 65 y.o. female who complains of generalized abdominal pain and cramping with constipation for the past week. She was seen in the emergency department 2 days ago when she realized that she was unable tolerate anything by mouth and was having vomiting. CT scan showed some swelling of the sigmoid colon area but no frank obstruction. Patient was started on a GI regimen, but continued having vomiting and lack of bowel movements. When she tried taking magnesium citrate at home she vomited up instead. She followed up with GI this worried and was sent to the ED due to intractable nausea vomiting and oral intolerance with constipation for hydration and possible admission.  No fever chest pain or shortness of breath. No trauma. He denies hematemesis or rectal bleeding.     Past Medical History  Diagnosis Date  . Anemia   . Osteoarthritis   . Asthma   . Diabetes mellitus   . GERD (gastroesophageal reflux disease)   . Allergic rhinitis   . Hypertension   . Bipolar disorder (HCC)     takes Synthroid meds for Bipolar  . Shortness of breath   . Sleep apnea     CPAP  . Depression      Patient Active Problem List   Diagnosis Date Noted  . Constipation 11/22/2015  . MRSA (methicillin resistant Staphylococcus aureus) infection 10/11/2012  . Left shoulder pain 10/11/2012  . Septic arthritis (HCC) 10/11/2012  . Right anterior knee pain 08/29/2012  . Urinary urgency 07/28/2012  . Leg pain 10/10/2011  . Morbid obesity with BMI of 60.0-69.9, adult (HCC) 05/13/2011  . Essential hypertension 05/17/2010  . HIP PAIN 12/25/2009  . CERVICAL RADICULOPATHY, RIGHT 12/25/2009  . BENIGN POSITIONAL VERTIGO 07/21/2008  .  URINARY INCONTINENCE, MIXED 12/14/2007  . HYPERCHOLESTEROLEMIA 08/16/2007  . Hypothyroidism 07/30/2007  . DM type 2 with diabetic peripheral neuropathy (HCC) 07/30/2007  . ANEMIA-NOS 07/30/2007  . BIPOLAR AFFECTIVE DISORDER, DEPRESSED 07/30/2007  . ALLERGIC RHINITIS 07/30/2007  . Asthma 07/30/2007  . GERD 07/30/2007  . Osteoarthritis 07/30/2007  . SLEEP APNEA 07/30/2007     Past Surgical History  Procedure Laterality Date  . Total hip arthroplasty  2002    Left  . Partial hysterectomy  1984    for mennorhagia, ovaries remain  . Foot surgery  1982    bone spur  . Neck surgery      Herniated disk C2,3,4  . Tmj arthroplasty  1982  . Hospitalized      for bipolar  . Carpal tunnel release  2002  . Cervical fusion  2012    C2/3/4  . Knee arthroscopy  09/01/2012    Procedure: ARTHROSCOPY KNEE;  Surgeon: Dannielle Huh, MD;  Location: [facility];  Service: Orthopedics;  Laterality: Right;  . I&d extremity  09/06/2012    Procedure: IRRIGATION AND DEBRIDEMENT EXTREMITY;  Surgeon: Budd Palmer, MD;  Location: [facility] OR;  Service: Orthopedics;  Laterality: Right;  I&D of right thigh  . Nose surgery       Current Outpatient Rx  Name  Route  Sig  Dispense  Refill  . albuterol (PROVENTIL HFA;VENTOLIN HFA) 108 (90 BASE) MCG/ACT inhaler   Inhalation   Inhale 2 puffs into the lungs every 6 (six) hours  as needed for wheezing or shortness of breath.          Marland Kitchen aspirin 81 MG EC tablet   Oral   Take 81 mg by mouth at bedtime.          Marland Kitchen Besifloxacin HCl (BESIVANCE) 0.6 % SUSP   Left Eye   Place 1 drop into the left eye 3 (three) times daily.          . Bromfenac Sodium (PROLENSA) 0.07 % SOLN   Both Eyes   Place 1 drop into both eyes at bedtime.          . Calcium Carbonate-Vitamin D (CALCIUM 600+D) 600-400 MG-UNIT tablet   Oral   Take 1 tablet by mouth daily.         . celecoxib (CELEBREX) 200 MG capsule   Oral   Take 1 capsule (200 mg total) by mouth 2 (two) times daily.    180 capsule   3   . Difluprednate (DUREZOL) 0.05 % EMUL   Left Eye   Place 1 drop into the left eye 3 (three) times daily.          Marland Kitchen doxycycline (VIBRA-TABS) 100 MG tablet   Oral   Take 100 mg by mouth at bedtime.         . Fluticasone-Salmeterol (ADVAIR) 250-50 MCG/DOSE AEPB   Inhalation   Inhale 1 puff into the lungs 2 (two) times daily.         . furosemide (LASIX) 40 MG tablet   Oral   Take 40 mg by mouth 2 (two) times daily.         Marland Kitchen gentamicin (GARAMYCIN) 0.3 % ophthalmic ointment   Left Eye   Place 1 application into the left eye 3 (three) times daily.         Marland Kitchen glucosamine-chondroitin 500-400 MG tablet   Oral   Take 1 tablet by mouth 2 (two) times daily.         Marland Kitchen lamoTRIgine (LAMICTAL) 200 MG tablet   Oral   Take 200 mg by mouth daily.           Marland Kitchen levothyroxine (SYNTHROID, LEVOTHROID) 150 MCG tablet   Oral   Take 150 mcg by mouth daily before breakfast.          . Liraglutide (VICTOZA) 18 MG/3ML SOPN   Subcutaneous   Inject 1.8 mg into the skin daily.         Marland Kitchen losartan (COZAAR) 50 MG tablet   Oral   Take 50 mg by mouth daily.         . metFORMIN (GLUCOPHAGE-XR) 500 MG 24 hr tablet   Oral   Take 2 tablets (1,000 mg total) by mouth daily with breakfast.   180 tablet   3   . montelukast (SINGULAIR) 10 MG tablet   Oral   Take 10 mg by mouth at bedtime.         . Multiple Vitamin (MULTIVITAMIN WITH MINERALS) TABS tablet   Oral   Take 1 tablet by mouth daily.         . ondansetron (ZOFRAN) 8 MG tablet   Oral   Take 8 mg by mouth every 8 (eight) hours as needed for nausea or vomiting.          . Oxcarbazepine (TRILEPTAL) 300 MG tablet   Oral   Take 300-600 mg by mouth 2 (two) times daily. Pt takes one tablet in the morning and two in  the evening.         . pantoprazole (PROTONIX) 40 MG tablet   Oral   Take 1 tablet (40 mg total) by mouth daily.   90 tablet   3   . polyethylene glycol (MIRALAX / GLYCOLAX) packet    Oral   Take 17 g by mouth daily as needed for mild constipation.          . simvastatin (ZOCOR) 40 MG tablet   Oral   Take 1 tablet (40 mg total) by mouth every evening.   90 tablet   3   . zaleplon (SONATA) 10 MG capsule   Oral   Take 10-20 mg by mouth at bedtime as needed for sleep.            Allergies Cephalexin; Hydrocodone; Risperidone and related; Seroquel; and Sulfa antibiotics   Family History  Problem Relation Age of Onset  . Aneurysm Father 62    brain  . Alcohol abuse Father   . CAD Other     several siblings  . Anesthesia problems Neg Hx   . Hypotension Neg Hx   . Malignant hyperthermia Neg Hx   . Pseudochol deficiency Neg Hx   . Cancer Brother     prostate  . Cancer Father     possibly  . Diabetes Brother     Social History Social History  Substance Use Topics  . Smoking status: Former Smoker -- 1.50 packs/day    Types: Cigarettes    Quit date: 04/29/2009  . Smokeless tobacco: Never Used  . Alcohol Use: Yes     Comment: very rare    Review of Systems  Constitutional:   No fever or chills. No weight changes Eyes:   No blurry vision or double vision.  ENT:   No sore throat.  Cardiovascular:   No chest pain. Respiratory:   No dyspnea or cough. Gastrointestinal:   Denies abdominal pain with vomiting and constipation..  No BRBPR or melena. Genitourinary:   Negative for dysuria or difficulty urinating. Musculoskeletal:   Negative for back pain. No joint swelling or pain. Skin:   Negative for rash. Neurological:   Negative for headaches, focal weakness or numbness. Psychiatric:  No anxiety or depression.   Endocrine:  No changes in energy or sleep difficulty.  10-point ROS otherwise negative.  ____________________________________________   PHYSICAL EXAM:  VITAL SIGNS: ED Triage Vitals  Enc Vitals Group     BP 11/27/15 1151 149/73 mmHg     Pulse Rate 11/27/15 1151 79     Resp 11/27/15 1151 16     Temp 11/27/15 1151 98 F (36.7  C)     Temp Source 11/27/15 1151 Oral     SpO2 11/27/15 1151 97 %     Weight 11/27/15 1151 350 lb (158.759 kg)     Height 11/27/15 1151 5\' 4"  (1.626 m)     Head Cir --      Peak Flow --      Pain Score 11/27/15 1153 10     Pain Loc --      Pain Edu? --      Excl. in GC? --     Vital signs reviewed, nursing assessments reviewed.   Constitutional:   Alert and oriented. Uncomfortable. Eyes:   No scleral icterus. No conjunctival pallor. PERRL. EOMI ENT   Head:   Normocephalic and atraumatic.   Nose:   No congestion/rhinnorhea. No septal hematoma   Mouth/Throat:   Dry mucous membranes,  no pharyngeal erythema. No peritonsillar mass.    Neck:   No stridor. No SubQ emphysema. No meningismus. Hematological/Lymphatic/Immunilogical:   No cervical lymphadenopathy. Cardiovascular:   Tachycardia heart rate 100. Symmetric bilateral radial and DP pulses.  No murmurs.  Respiratory:   Normal respiratory effort without tachypnea nor retractions. Breath sounds are clear and equal bilaterally. No wheezes/rales/rhonchi. Gastrointestinal:   Soft with generalized tenderness. Obese abdomen, not grossly distended.. There is no CVA tenderness.  No rebound, rigidity, or guarding. Genitourinary:   deferred Musculoskeletal:   Nontender with normal range of motion in all extremities. No joint effusions.  No lower extremity tenderness.  Chronic symmetric lymphedema of lower extremities. Neurologic:   Normal speech and language.  CN 2-10 normal. Motor grossly intact. No gross focal neurologic deficits are appreciated.  Skin:    Skin is warm, dry and intact. No rash noted.  No petechiae, purpura, or bullae. Psychiatric:   Mood and affect are normal. ____________________________________________    LABS (pertinent positives/negatives) (all labs ordered are listed, but only abnormal results are displayed) Labs Reviewed  COMPREHENSIVE METABOLIC PANEL - Abnormal; Notable for the following:     Chloride 100 (*)    Glucose, Bld 153 (*)    All other components within normal limits  CBC - Abnormal; Notable for the following:    RDW 15.4 (*)    All other components within normal limits  LIPASE, BLOOD  URINALYSIS COMPLETEWITH MICROSCOPIC ([facility] ONLY)   ____________________________________________   EKG    ____________________________________________    RADIOLOGY  CT abdomen and pelvis again redemonstrates narrowing of the sigmoid colon without any evidence of obstruction. Proximal bowel is decompressed. Appears improved from most recent scan 2 days ago.  ____________________________________________   PROCEDURES   ____________________________________________   INITIAL IMPRESSION / ASSESSMENT AND PLAN / ED COURSE  Pertinent labs & imaging results that were available during my care of the patient were reviewed by me and considered in my medical decision making (see chart for details).  Patient with worsening nausea vomiting constipation and oral intolerance despite attempts at outpatient management with GI regimen. I think we will have to repeat the CT scan to again evaluate for obstruction. We'll control her symptoms with Dilaudid and Zofran and reassess after imaging is obtained and other lab work is resulted. IV fluids for hydration for now.   ----------------------------------------- 9:33 PM on 11/27/2015 -----------------------------------------  After scan patient seemed calm and comfortable with symptoms well controlled. However when trying to have her take anything by mouth including MiraLAX and magnesium citrate the patient has severe abdominal pain and unable to take anything else by mouth. She is not passing any stool in the ED despite aggressive measures. After several hours of attempting to regain adequate bowel function, I discussed the case with the hospitalist to admit for intractable abdominal pain. At this point we'll control her symptoms with Dilaudid  which is the only real safe analgesic class with her other comorbidities, continue IV fluids, continue Reglan and MiraLAX.      ____________________________________________   FINAL CLINICAL IMPRESSION(S) / ED DIAGNOSES  Final diagnoses:  Intractable abdominal pain  Constipation, unspecified constipation type      Sharman Cheek, MD 11/27/15 2135

## 2015-11-27 NOTE — ED Notes (Signed)
Seen in ED Sunday night for same complaint.  C/O Abdominal cramping and constipation.  Followed up with Dr. Ricki Rodriguez this morning and sent to ED for eval and possible admission. Patient also c/o vomiting.  Unable to tolerate any PO intake.

## 2015-11-28 LAB — CBC
HCT: 35.6 % (ref 35.0–47.0)
Hemoglobin: 12 g/dL (ref 12.0–16.0)
MCH: 28.4 pg (ref 26.0–34.0)
MCHC: 33.7 g/dL (ref 32.0–36.0)
MCV: 84.1 fL (ref 80.0–100.0)
Platelets: 281 10*3/uL (ref 150–440)
RBC: 4.23 MIL/uL (ref 3.80–5.20)
RDW: 15.3 % — ABNORMAL HIGH (ref 11.5–14.5)
WBC: 9.5 10*3/uL (ref 3.6–11.0)

## 2015-11-28 LAB — URINALYSIS COMPLETE WITH MICROSCOPIC (ARMC ONLY)
Bilirubin Urine: NEGATIVE
Glucose, UA: NEGATIVE mg/dL
Hgb urine dipstick: NEGATIVE
Ketones, ur: NEGATIVE mg/dL
Nitrite: NEGATIVE
Protein, ur: 30 mg/dL — AB
Specific Gravity, Urine: 1.045 — ABNORMAL HIGH (ref 1.005–1.030)
pH: 5 (ref 5.0–8.0)

## 2015-11-28 LAB — BASIC METABOLIC PANEL
Anion gap: 6 (ref 5–15)
BUN: 16 mg/dL (ref 6–20)
CO2: 28 mmol/L (ref 22–32)
Calcium: 8.8 mg/dL — ABNORMAL LOW (ref 8.9–10.3)
Chloride: 104 mmol/L (ref 101–111)
Creatinine, Ser: 0.57 mg/dL (ref 0.44–1.00)
GFR calc Af Amer: >60 mL/min (ref >60)
GFR calc non Af Amer: >60 mL/min (ref >60)
Glucose, Bld: 156 mg/dL — ABNORMAL HIGH (ref 65–99)
Potassium: 3.8 mmol/L (ref 3.5–5.1)
Sodium: 138 mmol/L (ref 135–145)

## 2015-11-28 LAB — GLUCOSE, CAPILLARY
Glucose-Capillary: 160 mg/dL — ABNORMAL HIGH (ref 65–99)
Glucose-Capillary: 150 mg/dL — ABNORMAL HIGH (ref 65–99)
Glucose-Capillary: 112 mg/dL — ABNORMAL HIGH (ref 65–99)
Glucose-Capillary: 155 mg/dL — ABNORMAL HIGH (ref 65–99)

## 2015-11-28 LAB — MRSA PCR SCREENING: MRSA by PCR: NEGATIVE

## 2015-11-28 MED ORDER — CIPROFLOXACIN HCL 500 MG PO TABS
500.0000 mg | ORAL_TABLET | Freq: Two times a day (BID) | ORAL | Status: DC
  Administered 2015-11-28 – 2015-11-29 (×2): 500 mg via ORAL
  Filled 2015-11-28 (×2): qty 1

## 2015-11-28 MED ORDER — DIFLUPREDNATE 0.05 % OP EMUL: 1.0000 [drp] | Freq: Every day | OPHTHALMIC | Status: DC

## 2015-11-28 MED ORDER — ACETAMINOPHEN 650 MG RE SUPP: 650.0000 mg | Freq: Four times a day (QID) | RECTAL | Status: DC | PRN

## 2015-11-28 MED ORDER — ACETAMINOPHEN 325 MG PO TABS: 650.0000 mg | ORAL_TABLET | Freq: Four times a day (QID) | ORAL | Status: DC | PRN

## 2015-11-28 MED ORDER — METRONIDAZOLE 500 MG PO TABS
250.0000 mg | ORAL_TABLET | Freq: Three times a day (TID) | ORAL | Status: DC
  Administered 2015-11-28 – 2015-11-29 (×2): 250 mg via ORAL
  Filled 2015-11-28 (×2): qty 1

## 2015-11-28 MED ORDER — OXYCODONE-ACETAMINOPHEN 5-325 MG PO TABS: 1.0000 | ORAL_TABLET | Freq: Four times a day (QID) | ORAL | Status: DC | PRN

## 2015-11-28 MED ORDER — MOMETASONE FURO-FORMOTEROL FUM 200-5 MCG/ACT IN AERO
2.0000 | INHALATION_SPRAY | Freq: Two times a day (BID) | RESPIRATORY_TRACT | Status: DC
  Administered 2015-11-28 – 2015-11-29 (×2): 2 via RESPIRATORY_TRACT
  Filled 2015-11-28: qty 8.8

## 2015-11-28 MED ORDER — DOXYCYCLINE HYCLATE 100 MG PO TABS
100.0000 mg | ORAL_TABLET | Freq: Every day | ORAL | Status: DC
  Administered 2015-11-28 – 2015-11-29 (×2): 100 mg via ORAL
  Filled 2015-11-28 (×2): qty 1

## 2015-11-28 MED ORDER — ASPIRIN EC 81 MG PO TBEC
81.0000 mg | DELAYED_RELEASE_TABLET | Freq: Every day | ORAL | Status: DC
  Administered 2015-11-28 (×2): 81 mg via ORAL
  Filled 2015-11-28 (×2): qty 1

## 2015-11-28 MED ORDER — SODIUM CHLORIDE 0.9 % IV SOLN
INTRAVENOUS | Status: DC
  Administered 2015-11-28 – 2015-11-29 (×2): via INTRAVENOUS

## 2015-11-28 MED ORDER — DIFLUPREDNATE 0.05 % OP EMUL
1.0000 [drp] | Freq: Two times a day (BID) | OPHTHALMIC | Status: DC
  Administered 2015-11-28 – 2015-11-29 (×3): 1 [drp] via OPHTHALMIC
  Filled 2015-11-28: qty 5

## 2015-11-28 MED ORDER — SIMVASTATIN 40 MG PO TABS
40.0000 mg | ORAL_TABLET | Freq: Every evening | ORAL | Status: DC
  Administered 2015-11-28: 40 mg via ORAL
  Filled 2015-11-28 (×2): qty 1

## 2015-11-28 MED ORDER — OXCARBAZEPINE 300 MG PO TABS: 300.0000 mg | ORAL_TABLET | Freq: Two times a day (BID) | ORAL | Status: DC

## 2015-11-28 MED ORDER — ENOXAPARIN SODIUM 40 MG/0.4ML ~~LOC~~ SOLN
40.0000 mg | Freq: Two times a day (BID) | SUBCUTANEOUS | Status: DC
  Administered 2015-11-28 – 2015-11-29 (×4): 40 mg via SUBCUTANEOUS
  Filled 2015-11-28 (×4): qty 0.4

## 2015-11-28 MED ORDER — LEVOTHYROXINE SODIUM 150 MCG PO TABS
150.0000 ug | ORAL_TABLET | Freq: Every day | ORAL | Status: DC
  Administered 2015-11-28 – 2015-11-29 (×2): 150 ug via ORAL
  Filled 2015-11-28 (×2): qty 1

## 2015-11-28 MED ORDER — KETOROLAC TROMETHAMINE 0.5 % OP SOLN
1.0000 [drp] | Freq: Every day | OPHTHALMIC | Status: DC
  Administered 2015-11-28 (×2): 1 [drp] via OPHTHALMIC
  Filled 2015-11-28: qty 3

## 2015-11-28 MED ORDER — PANTOPRAZOLE SODIUM 40 MG PO TBEC
40.0000 mg | DELAYED_RELEASE_TABLET | Freq: Every day | ORAL | Status: DC
  Administered 2015-11-28 – 2015-11-29 (×2): 40 mg via ORAL
  Filled 2015-11-28 (×2): qty 1

## 2015-11-28 MED ORDER — GENTAMICIN SULFATE 0.3 % OP OINT
1.0000 "application " | TOPICAL_OINTMENT | Freq: Three times a day (TID) | OPHTHALMIC | Status: DC
  Filled 2015-11-28: qty 3.5

## 2015-11-28 MED ORDER — OXCARBAZEPINE 300 MG PO TABS
600.0000 mg | ORAL_TABLET | Freq: Every day | ORAL | Status: DC
  Administered 2015-11-28: 600 mg via ORAL
  Filled 2015-11-28 (×2): qty 2

## 2015-11-28 MED ORDER — ONDANSETRON HCL 4 MG/2ML IJ SOLN
4.0000 mg | Freq: Four times a day (QID) | INTRAMUSCULAR | Status: DC | PRN
  Administered 2015-11-28: 4 mg via INTRAVENOUS
  Filled 2015-11-28: qty 2

## 2015-11-28 MED ORDER — OXCARBAZEPINE 300 MG PO TABS
300.0000 mg | ORAL_TABLET | Freq: Every day | ORAL | Status: DC
  Administered 2015-11-28 – 2015-11-29 (×2): 300 mg via ORAL
  Filled 2015-11-28 (×2): qty 1

## 2015-11-28 MED ORDER — ONDANSETRON HCL 4 MG PO TABS: 4.0000 mg | ORAL_TABLET | Freq: Four times a day (QID) | ORAL | Status: DC | PRN

## 2015-11-28 MED ORDER — LAMOTRIGINE 100 MG PO TABS
200.0000 mg | ORAL_TABLET | Freq: Every day | ORAL | Status: DC
  Administered 2015-11-28 – 2015-11-29 (×2): 200 mg via ORAL
  Filled 2015-11-28 (×2): qty 2

## 2015-11-28 MED ORDER — LOSARTAN POTASSIUM 50 MG PO TABS
50.0000 mg | ORAL_TABLET | Freq: Every day | ORAL | Status: DC
  Administered 2015-11-28 – 2015-11-29 (×2): 50 mg via ORAL
  Filled 2015-11-28 (×2): qty 1

## 2015-11-28 MED ORDER — HYDROMORPHONE HCL 1 MG/ML IJ SOLN
0.5000 mg | INTRAMUSCULAR | Status: DC | PRN
  Administered 2015-11-28 (×3): 0.5 mg via INTRAVENOUS
  Filled 2015-11-28 (×3): qty 1

## 2015-11-28 MED ORDER — MONTELUKAST SODIUM 10 MG PO TABS
10.0000 mg | ORAL_TABLET | Freq: Every day | ORAL | Status: DC
  Administered 2015-11-28 (×2): 10 mg via ORAL
  Filled 2015-11-28 (×2): qty 1

## 2015-11-28 MED ORDER — INSULIN ASPART 100 UNIT/ML ~~LOC~~ SOLN
0.0000 [IU] | Freq: Four times a day (QID) | SUBCUTANEOUS | Status: DC
  Administered 2015-11-28: 1 [IU] via SUBCUTANEOUS
  Administered 2015-11-28 (×2): 2 [IU] via SUBCUTANEOUS
  Administered 2015-11-29 (×2): 1 [IU] via SUBCUTANEOUS
  Filled 2015-11-28 (×2): qty 1
  Filled 2015-11-28 (×3): qty 2

## 2015-11-28 MED ORDER — DIFLUPREDNATE 0.05 % OP EMUL: 1.0000 [drp] | Freq: Three times a day (TID) | OPHTHALMIC | Status: DC

## 2015-11-28 MED ORDER — GATIFLOXACIN 0.5 % OP SOLN
1.0000 [drp] | Freq: Four times a day (QID) | OPHTHALMIC | Status: DC
  Administered 2015-11-28 – 2015-11-29 (×6): 1 [drp] via OPHTHALMIC
  Filled 2015-11-28: qty 2.5

## 2015-11-28 MED ORDER — SODIUM CHLORIDE 0.9 % IV SOLN
INTRAVENOUS | Status: AC
  Administered 2015-11-28: 01:00:00 via INTRAVENOUS

## 2015-11-28 NOTE — Progress Notes (Signed)
[facility] -  at [facility]   PATIENT NAME: Amy Barnes    MR#:  [id number]  DATE OF BIRTH:  1951-05-26  SUBJECTIVE:  CHIEF COMPLAINT:   Chief Complaint  Patient presents with  . Abdominal Pain  . Emesis     Found to have some swelling aroung sigmoid colon on CT abd.   Nausea better today.  REVIEW OF SYSTEMS:  CONSTITUTIONAL: No fever, fatigue or weakness.  EYES: No blurred or double vision.  EARS, NOSE, AND THROAT: No tinnitus or ear pain.  RESPIRATORY: No cough, shortness of breath, wheezing or hemoptysis.  CARDIOVASCULAR: No chest pain, orthopnea, edema.  GASTROINTESTINAL: No nausea, vomiting, diarrhea , positive for  abdominal pain.  GENITOURINARY: No dysuria, hematuria.  ENDOCRINE: No polyuria, nocturia,  HEMATOLOGY: No anemia, easy bruising or bleeding SKIN: No rash or lesion. MUSCULOSKELETAL: No joint pain or arthritis.   NEUROLOGIC: No tingling, numbness, weakness.  PSYCHIATRY: No anxiety or depression.   ROS  DRUG ALLERGIES:   Allergies  Allergen Reactions  . Cephalexin Hives  . Hydrocodone Other (See Comments)    Reaction:  Hallucinations   . Risperidone And Related Other (See Comments)    Reaction:  Unknown   . Seroquel [Quetiapine Fumarate] Other (See Comments)    Reaction:  Made pt excessively sleepy  . Sulfa Antibiotics Rash    VITALS:  Blood pressure 128/59, pulse 88, temperature 98.8 F (37.1 C), temperature source Oral, resp. rate 17, height 5\' 4"  (1.626 m), weight 154.314 kg (340 lb 3.2 oz), SpO2 95 %.  PHYSICAL EXAMINATION:  GENERAL:  65 y.o.-year-old patient lying in the bed with no acute distress.  EYES: Pupils equal, round, reactive to light and accommodation. No scleral icterus. Extraocular muscles intact.  HEENT: Head atraumatic, normocephalic. Oropharynx and nasopharynx clear.  NECK:  Supple, no jugular venous distention. No thyroid enlargement, no tenderness.  LUNGS: Normal breath sounds bilaterally, no  wheezing, rales,rhonchi or crepitation. No use of accessory muscles of respiration.  CARDIOVASCULAR: S1, S2 normal. No murmurs, rubs, or gallops.  ABDOMEN: Soft, mild tender, nondistended. Bowel sounds present. No organomegaly or mass.  EXTREMITIES: No pedal edema, cyanosis, or clubbing.  NEUROLOGIC: Cranial nerves II through XII are intact. Muscle strength 5/5 in all extremities. Sensation intact. Gait not checked.  PSYCHIATRIC: The patient is alert and oriented x 3.  SKIN: No obvious rash, lesion, or ulcer.   Physical Exam LABORATORY PANEL:   CBC  Recent Labs Lab 11/28/15 0646  WBC 9.5  HGB 12.0  HCT 35.6  PLT 281   ------------------------------------------------------------------------------------------------------------------  Chemistries   Recent Labs Lab 11/27/15 1251 11/28/15 0646  NA 139 138  K 3.7 3.8  CL 100* 104  CO2 28 28  GLUCOSE 153* 156*  BUN 14 16  CREATININE 0.69 0.57  CALCIUM 10.1 8.8*  AST 28  --   ALT 24  --   ALKPHOS 89  --   BILITOT 0.7  --    ------------------------------------------------------------------------------------------------------------------  Cardiac Enzymes  Recent Labs Lab 11/25/15 2102 11/26/15 0014  TROPONINI 0.03 0.03   ------------------------------------------------------------------------------------------------------------------  RADIOLOGY:  Ct Abdomen Pelvis W Contrast  11/27/2015  CLINICAL DATA:  65 year old female with generalized abdominal cramping and constipation, with some vomiting and poor PO intake. EXAM: CT ABDOMEN AND PELVIS WITH CONTRAST TECHNIQUE: Multidetector CT imaging of the abdomen and pelvis was performed using the standard protocol following bolus administration of intravenous contrast. CONTRAST:  OMNIPAQUE IOHEXOL 300 MG/ML  SOLN COMPARISON:  CT the abdomen  and pelvis 11/25/2015. FINDINGS: Lower chest: Calcifications of the mitral annulus. Lipomatous hypertrophy of the interatrial septum.  Hepatobiliary: No cystic or solid hepatic lesions. No intra or extrahepatic biliary ductal dilatation. Amorphous high attenuation material lying dependently in the gallbladder, new compared to the recent prior examination, presumably vicarious excretion of contrast material from the recent prior CT examination. No findings to suggest an acute cholecystitis at this time. Pancreas: No pancreatic mass. No pancreatic ductal dilatation. No pancreatic or peripancreatic fluid or inflammatory changes. Spleen: Unremarkable. Adrenals/Urinary Tract: Bilateral adrenal glands and bilateral kidneys are normal in appearance. No hydroureteronephrosis. Urinary bladder is normal in appearance. Stomach/Bowel: Normal appearance of the stomach. No pathologic dilatation of small bowel or colon. There is again some mural thickening in the mid sigmoid colon, however, this is poorly demonstrated on today's study secondary to decompression of this loop of bowel, and extensive beam hardening artifact from the patient's left hip arthroplasty. Normal appendix. Vascular/Lymphatic: Atherosclerosis throughout the abdominal and pelvic vasculature, without evidence of aneurysm or dissection. No lymphadenopathy noted in the abdomen or pelvis. Reproductive: Status post hysterectomy.  Ovaries are atrophic. Other: Small umbilical hernia containing only omental fat is unchanged. No significant volume of ascites. No pneumoperitoneum. Musculoskeletal: Status post PLIF at L3-L4 with an interbody graft at the L3-L4 interspace. Status post left total hip arthroplasty. Large lucent area in the left ilium adjacent to the acetabulum, potentially sequela of particle disease. There are no aggressive appearing lytic or blastic lesions noted in the visualized portions of the skeleton. IMPRESSION: 1. No acute findings in the abdomen or pelvis to account for the patient's symptoms. 2. Focal area of mural thickening in the mid sigmoid colon suspected on the prior study  is less well demonstrated on today's examination secondary to under distention of this portion of the colon and obscuration by beam hardening artifact from the patient's left hip arthroplasty. As previously suggested, followup nonemergent colonoscopy is recommended in the near future to exclude underlying neoplasm. 3. Small umbilical hernia containing only omental fat. 4. Atherosclerosis. 5. Additional incidental findings, as above. Electronically Signed   By: Trudie Reed M.D.   On: 11/27/2015 15:44    ASSESSMENT AND PLAN:   Principal Problem:   Intractable nausea and vomiting Active Problems:   Hypothyroidism   DM type 2 with diabetic peripheral neuropathy (HCC)   Essential hypertension   GERD   OSA on CPAP   Abdominal pain  Intractable nausea and vomiting -    Likely diverticulitis.  GI consult for the same, question need for sigmoidoscopy to further evaluate this CT finding.   Antiemetics on board.   She had colonoscopy 1 year ago , get report.   Clear liquids oral cipro+ flagyl.   Abdominal pain - pain control   DM type 2 with diabetic peripheral neuropathy (HCC) - sliding scale insulin with corresponding glucose checks every 6 hours    Essential hypertension - continue home meds,   OSA on CPAP - daily at bedtime CPAP  Hypothyroidism - home dose thyroid replacement  GERD - home dose PPI   All the records are reviewed and case discussed with Care Management/Social Workerr. Management plans discussed with the patient, family and they are in agreement.  CODE STATUS: Full.  TOTAL TIME TAKING CARE OF THIS PATIENT: 35 minutes.  Husband and daughter present in room.   POSSIBLE D/C IN 1-2 DAYS, DEPENDING ON CLINICAL CONDITION.   Altamese Dilling M.D on 11/28/2015   Between 7am to 6pm - Pager -  [phone]  After 6pm go to [email or web address] - password [email or web address]  [facility]  Office  [phone]  CC: Primary care physician; [name], MD  Note: This dictation was prepared with Dragon dictation along with smaller phrase technology. Any transcriptional errors that result from this process are unintentional.

## 2015-11-28 NOTE — Consult Note (Signed)
GI Inpatient Consult Note  Reason for Consult: Abdominal pain, thickening of colon on CT   Attending Requesting Consult: Dr. [name]  History of Present Illness: Amy Barnes is a 65 y.o. female with a history of HTN, HLD, hypothyroidism, GERD, sleep apnea, bipolar disorder, and morbid obesity admitted with intractable nausea and vomiting.  Patient states her chronic constipation worsened recently following a cataract surgery about 2 weeks ago.  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  Patient tried multiple capfuls of Miralax a dulcolax to relieve constipation, which caused severe diarrhea. She took Imodium and Pepto Bismol for this, which caused constipation.  Patient underwent her second cataract surgery last week, and constipation returned.  Patient then presented to the [facility] ED after 5 days w/o a BM and severe LLQ pain.  Her pain was controlled via IV medications, but an enema was administered w/o relief.  CT a/p showed circumferential thickening in the mid-sigmoid colon, measuring 5-6cm.  With symptoms controlled, she was discharged with Mag Citrate, Senna and Colace.  Upon drinking Mag Citrate, patient began vomiting anything by mouth for the next 2 days.  She was scheduled for an OV at [facility] yesterday, but was escorted to the ED due to intractable nausea and vomiting.  Given her lack of improvement and continued focal mural thickening on CT, patient was admitted for further evaluation.  She continues to receive symptomatic management via IV.  Today, Ms. Jacquot states her nausea and vomiting have subsided after receiving IV antiemetics.  Her LLQ pain is still present, but much improved, since receiving IV pain medications.  She did have multiple, large volume BMs this morning.  Patient denies hematochezia or melena.   Overall, her weight and appetite are stable.  She denies FHx of CCA or other GI malignancy.  Last colonoscopy: 2016 in [place] - polyps removed per patient (patient's daughter is obtaining report and will fax to [facility]) Last EGD: 2012 - Barrett's esophagus per patient  Past Medical History:  Past Medical History  Diagnosis Date  . Anemia   . Osteoarthritis   . Asthma   . Diabetes mellitus   . GERD (gastroesophageal reflux disease)   . Allergic rhinitis   . Hypertension   . Bipolar disorder (HCC)     takes Synthroid meds for Bipolar  . Shortness of breath   . Sleep apnea     CPAP  . Depression     Problem List: Patient Active Problem List   Diagnosis Date Noted  . Intractable nausea and vomiting 11/27/2015  . Abdominal pain 11/27/2015  . Constipation 11/22/2015  . MRSA (methicillin resistant Staphylococcus aureus) infection 10/11/2012  . Left shoulder pain 10/11/2012  . Septic arthritis (HCC) 10/11/2012  . Right anterior knee pain 08/29/2012  . Urinary urgency 07/28/2012  . Leg pain 10/10/2011  . Morbid obesity with BMI of 60.0-69.9, adult (HCC) 05/13/2011  . Essential hypertension 05/17/2010  . HIP PAIN 12/25/2009  . CERVICAL RADICULOPATHY, RIGHT 12/25/2009  . BENIGN POSITIONAL VERTIGO 07/21/2008  . URINARY INCONTINENCE, MIXED 12/14/2007  . HYPERCHOLESTEROLEMIA 08/16/2007  . Hypothyroidism 07/30/2007  . DM type 2 with diabetic peripheral neuropathy (HCC) 07/30/2007  . ANEMIA-NOS 07/30/2007  . BIPOLAR AFFECTIVE DISORDER, DEPRESSED 07/30/2007  . ALLERGIC RHINITIS 07/30/2007  . Asthma 07/30/2007  . GERD 07/30/2007  . Osteoarthritis 07/30/2007  .  OSA on CPAP 07/30/2007    Past Surgical History: Past Surgical History  Procedure Laterality Date  . Total hip arthroplasty  2002    Left  . Partial hysterectomy  1984    for mennorhagia, ovaries remain  . Foot surgery  1982    bone spur  . Neck surgery      Herniated disk C2,3,4  . Tmj arthroplasty  1982  .  Hospitalized      for bipolar  . Carpal tunnel release  2002  . Cervical fusion  2012    C2/3/4  . Knee arthroscopy  09/01/2012    Procedure: ARTHROSCOPY KNEE;  Surgeon: Dannielle Huh, MD;  Location: [facility] OR;  Service: Orthopedics;  Laterality: Right;  . I&d extremity  09/06/2012    Procedure: IRRIGATION AND DEBRIDEMENT EXTREMITY;  Surgeon: Budd Palmer, MD;  Location: [facility] OR;  Service: Orthopedics;  Laterality: Right;  I&D of right thigh  . Nose surgery      Allergies: Allergies  Allergen Reactions  . Cephalexin Hives  . Hydrocodone Other (See Comments)    Reaction:  Hallucinations   . Risperidone And Related Other (See Comments)    Reaction:  Unknown   . Seroquel [Quetiapine Fumarate] Other (See Comments)    Reaction:  Made pt excessively sleepy  . Sulfa Antibiotics Rash    Home Medications: Prescriptions prior to admission  Medication Sig Dispense Refill Last Dose  . albuterol (PROVENTIL HFA;VENTOLIN HFA) 108 (90 BASE) MCG/ACT inhaler Inhale 2 puffs into the lungs every 6 (six) hours as needed for wheezing or shortness of breath.    Past Month at Unknown time  . aspirin 81 MG EC tablet Take 81 mg by mouth at bedtime.    Past Week at Unknown time  . Besifloxacin HCl (BESIVANCE) 0.6 % SUSP Place 1 drop into the left eye 3 (three) times daily.    11/27/2015 at Unknown time  . Bromfenac Sodium (PROLENSA) 0.07 % SOLN Place 1 drop into both eyes at bedtime.    11/26/2015 at Unknown time  . Calcium Carbonate-Vitamin D (CALCIUM 600+D) 600-400 MG-UNIT tablet Take 1 tablet by mouth daily.   Past Week at Unknown time  . celecoxib (CELEBREX) 200 MG capsule Take 1 capsule (200 mg total) by mouth 2 (two) times daily. 180 capsule 3 Past Week at Unknown time  . Difluprednate (DUREZOL) 0.05 % EMUL Place 1 drop into the left eye 3 (three) times daily.    11/27/2015 at Unknown time  . doxycycline (VIBRA-TABS) 100 MG tablet Take 100 mg by mouth at bedtime.   Past Week at Unknown time  .  Fluticasone-Salmeterol (ADVAIR) 250-50 MCG/DOSE AEPB Inhale 1 puff into the lungs 2 (two) times daily.   Past Week at Unknown time  . furosemide (LASIX) 40 MG tablet Take 40 mg by mouth 2 (two) times daily.   Past Week at Unknown time  . gentamicin (GARAMYCIN) 0.3 % ophthalmic ointment Place 1 application into the left eye 3 (three) times daily.   11/27/2015 at Unknown time  . glucosamine-chondroitin 500-400 MG tablet Take 1 tablet by mouth 2 (two) times daily.   Past Week at Unknown time  . lamoTRIgine (LAMICTAL) 200 MG tablet Take 200 mg by mouth daily.     Past Week at Unknown time  . levothyroxine (SYNTHROID, LEVOTHROID) 150 MCG tablet Take 150 mcg by mouth daily before breakfast.    Past Week at Unknown time  . Liraglutide (VICTOZA) 18 MG/3ML SOPN Inject 1.8 mg into  the skin daily.   Past Week at Unknown time  . losartan (COZAAR) 50 MG tablet Take 50 mg by mouth daily.   Past Week at Unknown time  . metFORMIN (GLUCOPHAGE-XR) 500 MG 24 hr tablet Take 2 tablets (1,000 mg total) by mouth daily with breakfast. 180 tablet 3 Past Week at Unknown time  . montelukast (SINGULAIR) 10 MG tablet Take 10 mg by mouth at bedtime.   Past Week at Unknown time  . Multiple Vitamin (MULTIVITAMIN WITH MINERALS) TABS tablet Take 1 tablet by mouth daily.   Past Week at Unknown time  . ondansetron (ZOFRAN) 8 MG tablet Take 8 mg by mouth every 8 (eight) hours as needed for nausea or vomiting.    11/27/2015 at Unknown time  . Oxcarbazepine (TRILEPTAL) 300 MG tablet Take 300-600 mg by mouth 2 (two) times daily. Pt takes one tablet in the morning and two in the evening.   Past Week at Unknown time  . pantoprazole (PROTONIX) 40 MG tablet Take 1 tablet (40 mg total) by mouth daily. 90 tablet 3 Past Week at Unknown time  . polyethylene glycol (MIRALAX / GLYCOLAX) packet Take 17 g by mouth daily as needed for mild constipation.    Past Week at Unknown time  . simvastatin (ZOCOR) 40 MG tablet Take 1 tablet (40 mg total) by mouth  every evening. 90 tablet 3 Past Week at Unknown time  . zaleplon (SONATA) 10 MG capsule Take 10-20 mg by mouth at bedtime as needed for sleep.   Past Week at Unknown time   Home medication reconciliation was completed with the patient.   Scheduled Inpatient Medications:   . aspirin EC  81 mg Oral QHS  . Difluprednate  1 drop Left Eye BID   Followed by  . [START ON 12/05/2015] Difluprednate  1 drop Left Eye Daily  . enoxaparin (LOVENOX) injection  40 mg Subcutaneous BID  . gatifloxacin  1 drop Left Eye QID  . insulin aspart  0-9 Units Subcutaneous Q6H  . ketorolac  1 drop Both Eyes QHS  . lamoTRIgine  200 mg Oral Daily  . levothyroxine  150 mcg Oral QAC breakfast  . losartan  50 mg Oral Daily  . mometasone-formoterol  2 puff Inhalation BID  . montelukast  10 mg Oral QHS  . OXcarbazepine  300 mg Oral Daily   And  . OXcarbazepine  600 mg Oral Q2200  . pantoprazole  40 mg Oral QAC breakfast  . senna  2 tablet Oral STAT  . simvastatin  40 mg Oral QPM    Continuous Inpatient Infusions:   . sodium chloride 100 mL/hr at 11/28/15 0120    PRN Inpatient Medications:  acetaminophen **OR** acetaminophen, HYDROmorphone (DILAUDID) injection, ondansetron **OR** ondansetron (ZOFRAN) IV  Family History: family history includes Alcohol abuse in her father; Aneurysm (age of onset: 33) in her father; CAD in her other; Cancer in her brother and father; Diabetes in her brother. There is no history of Anesthesia problems, Hypotension, Malignant hyperthermia, or Pseudochol deficiency.    Social History:   reports that she quit smoking about 6 years ago. Her smoking use included Cigarettes. She smoked 1.50 packs per day. She has never used smokeless tobacco. She reports that she drinks alcohol. She reports that she does not use illicit drugs.   Review of Systems: Constitutional: Weight is stable.  Eyes: No changes in vision. ENT: No oral lesions, sore throat.  GI: see HPI.  Heme/Lymph: No easy  bruising.  CV:  No chest pain.  GU: No hematuria.  Integumentary: No rashes.  Neuro: No headaches.  Psych: No depression/anxiety.  Endocrine: No heat/cold intolerance.  Allergic/Immunologic: No urticaria.  Resp: No cough, SOB.  Musculoskeletal: No joint swelling.    Physical Examination: BP 140/72 mmHg  Pulse 76  Temp(Src) 98.4 F (36.9 C) (Oral)  Resp 21  Ht 5\' 4"  (1.626 m)  Wt 154.314 kg (340 lb 3.2 oz)  BMI 58.37 kg/m2  SpO2 94% Gen: NAD, alert and oriented x 4 HEENT: PEERLA, EOMI, Neck: supple, no JVD or thyromegaly Chest: CTA bilaterally, no wheezes, crackles, or other adventitious sounds CV: RRR, no m/g/c/r Abd: soft, NT, mild LLQ TTP, +BS in all four quadrants; no HSM, guarding, ridigity, or rebound tenderness Ext: no edema, well perfused with 2+ pulses, Skin: no rash or lesions noted Lymph: no LAD  Data: Lab Results  Component Value Date   WBC 9.5 11/28/2015   HGB 12.0 11/28/2015   HCT 35.6 11/28/2015   MCV 84.1 11/28/2015   PLT 281 11/28/2015    Recent Labs Lab 11/25/15 2102 11/27/15 1251 11/28/15 0646  HGB 12.3 12.4 12.0   Lab Results  Component Value Date   NA 138 11/28/2015   K 3.8 11/28/2015   CL 104 11/28/2015   CO2 28 11/28/2015   BUN 16 11/28/2015   CREATININE 0.57 11/28/2015   Lab Results  Component Value Date   ALT 24 11/27/2015   AST 28 11/27/2015   ALKPHOS 89 11/27/2015   BILITOT 0.7 11/27/2015   No results for input(s): APTT, INR, PTT in the last 168 hours.   Assessment/Plan: Ms. Stierwalt is a 65 y.o. female with a history of HTN, HLD, hypothyroidism, GERD, sleep apnea, bipolar disorder, and morbid obesity admitted with intractable nausea and vomiting.  LLQ pain, ausea and vomiting have improved with conservative IV management.  Repeat CT a/p is reassuring to exclude bowel obstruction or worsening colonic thickening.  Patient produced multiple, large volume BMs this morning after receiving mag citrate in the ED yesterday.  Patient's  daughter is requesting patient's colonoscopy from 2016 to be faxed to [facility] for review.  If colonoscopy was performed in late 2016 and largely normal, will likely not repeat the scope.  If the colonoscopy was about 1 year ago, will consider repeating the procedure based on patient's clinical course.  Recommend continuing conservative management for now, including a bowel regimen of Miralax and Senna daily, to prevent constipation.  Further recs per Dr. Shelle Iron pending review of colonoscopy report.  Recommendations: - Continue symptomatic management - Continue bowel regimen of Miralax and Senna daily - Awaiting 2016 colonoscopy report from previous GI in [place] - further recs pending review  Thank you for the consult. We will follow along with you. Please call with questions or concerns.  Burman Freestone, PA-C [facility] Phone: [phone] Pager: [phone]

## 2015-11-29 LAB — GLUCOSE, CAPILLARY
Glucose-Capillary: 123 mg/dL — ABNORMAL HIGH (ref 65–99)
Glucose-Capillary: 131 mg/dL — ABNORMAL HIGH (ref 65–99)
Glucose-Capillary: 96 mg/dL (ref 65–99)

## 2015-11-29 MED ORDER — LINACLOTIDE 145 MCG PO CAPS: 145.0000 ug | ORAL_CAPSULE | Freq: Every day | ORAL | Status: DC

## 2015-11-29 MED ORDER — METRONIDAZOLE 500 MG PO TABS
500.0000 mg | ORAL_TABLET | Freq: Three times a day (TID) | ORAL | Status: DC
  Administered 2015-11-29: 500 mg via ORAL
  Filled 2015-11-29: qty 1

## 2015-11-29 MED ORDER — OXYCODONE-ACETAMINOPHEN 5-325 MG PO TABS: 1.0000 | ORAL_TABLET | Freq: Four times a day (QID) | ORAL | Status: DC | PRN

## 2015-11-29 MED ORDER — METRONIDAZOLE 500 MG PO TABS: 500.0000 mg | ORAL_TABLET | Freq: Three times a day (TID) | ORAL | Status: DC

## 2015-11-29 MED ORDER — CLOTRIMAZOLE 1 % EX CREA: TOPICAL_CREAM | Freq: Two times a day (BID) | CUTANEOUS | Status: DC

## 2015-11-29 MED ORDER — CLOTRIMAZOLE 1 % EX CREA
TOPICAL_CREAM | Freq: Two times a day (BID) | CUTANEOUS | Status: DC
  Administered 2015-11-29: 16:00:00 via TOPICAL
  Filled 2015-11-29: qty 15

## 2015-11-29 MED ORDER — CIPROFLOXACIN HCL 500 MG PO TABS: 500.0000 mg | ORAL_TABLET | Freq: Two times a day (BID) | ORAL | Status: DC

## 2015-11-29 MED ORDER — LINACLOTIDE 145 MCG PO CAPS
145.0000 ug | ORAL_CAPSULE | Freq: Every day | ORAL | Status: DC
  Administered 2015-11-29: 145 ug via ORAL
  Filled 2015-11-29: qty 1

## 2015-11-29 NOTE — Discharge Summary (Signed)
[facility] - [place] at [facility]   PATIENT NAME: Amy Barnes    MR#:  [id number]  DATE OF BIRTH:  [birth year]  DATE OF ADMISSION:  11/27/2015 ADMITTING PHYSICIAN: Oralia Manis, MD  DATE OF DISCHARGE: 11/29/2015  PRIMARY CARE PHYSICIAN: [name], MD    ADMISSION DIAGNOSIS:  Intractable abdominal pain [R10.9] Constipation, unspecified constipation type [K59.00]  DISCHARGE DIAGNOSIS:  Principal Problem:   Intractable nausea and vomiting Active Problems:   Hypothyroidism   DM type 2 with diabetic peripheral neuropathy (HCC)   Essential hypertension   GERD   OSA on CPAP   Abdominal pain  Diverticulitis  SECONDARY DIAGNOSIS:   Past Medical History  Diagnosis Date  . Anemia   . Osteoarthritis   . Asthma   . Diabetes mellitus   . GERD (gastroesophageal reflux disease)   . Allergic rhinitis   . Hypertension   . Bipolar disorder (HCC)     takes Synthroid meds for Bipolar  . Shortness of breath   . Sleep apnea     CPAP  . Depression     HOSPITAL COURSE:   Intractable nausea and vomiting -   diverticulitis. GI consult for the same, question need for sigmoidoscopy to further evaluate this CT finding.    Antiemetics on board.  She had colonoscopy 1 year ago , get report.  Clear liquids oral cipro+ flagyl.    Tolerated soft diet, GI suggested to follow in office in 2 weeks for scope.   Abdominal pain - pain control   DM type 2 with diabetic peripheral neuropathy (HCC) - sliding scale insulin with corresponding glucose checks every 6 hours    Resume oral meds on discharge.   Essential hypertension - continue home meds,   OSA on CPAP - daily at bedtime CPAP  Hypothyroidism - home dose thyroid replacement  GERD - home dose PPI   DISCHARGE CONDITIONS:  Stable.  CONSULTS OBTAINED:  Treatment Team:  Elnita Maxwell, MD  DRUG ALLERGIES:   Allergies  Allergen Reactions  . Cephalexin Hives  . Hydrocodone Other  (See Comments)    Reaction:  Hallucinations   . Risperidone And Related Other (See Comments)    Reaction:  Unknown   . Seroquel [Quetiapine Fumarate] Other (See Comments)    Reaction:  Made pt excessively sleepy  . Sulfa Antibiotics Rash    DISCHARGE MEDICATIONS:   Current Discharge Medication List    START taking these medications   Details  ciprofloxacin (CIPRO) 500 MG tablet Take 1 tablet (500 mg total) by mouth 2 (two) times daily. Qty: 14 tablet, Refills: 0    clotrimazole (LOTRIMIN) 1 % cream Apply topically 2 (two) times daily. In groin Qty: 30 g, Refills: 0    Linaclotide (LINZESS) 145 MCG CAPS capsule Take 1 capsule (145 mcg total) by mouth daily. Qty: 30 capsule, Refills: 0    metroNIDAZOLE (FLAGYL) 500 MG tablet Take 1 tablet (500 mg total) by mouth 3 (three) times daily. Qty: 21 tablet, Refills: 0    oxyCODONE-acetaminophen (PERCOCET/ROXICET) 5-325 MG tablet Take 1 tablet by mouth every 6 (six) hours as needed for moderate pain. Qty: 20 tablet, Refills: 0      CONTINUE these medications which have NOT CHANGED   Details  albuterol (PROVENTIL HFA;VENTOLIN HFA) 108 (90 BASE) MCG/ACT inhaler Inhale 2 puffs into the lungs every 6 (six) hours as needed for wheezing or shortness of breath.     aspirin 81 MG EC tablet Take 81 mg by  mouth at bedtime.     Besifloxacin HCl (BESIVANCE) 0.6 % SUSP Place 1 drop into the left eye 3 (three) times daily.     Bromfenac Sodium (PROLENSA) 0.07 % SOLN Place 1 drop into both eyes at bedtime.     Calcium Carbonate-Vitamin D (CALCIUM 600+D) 600-400 MG-UNIT tablet Take 1 tablet by mouth daily.    celecoxib (CELEBREX) 200 MG capsule Take 1 capsule (200 mg total) by mouth 2 (two) times daily. Qty: 180 capsule, Refills: 3    Difluprednate (DUREZOL) 0.05 % EMUL Place 1 drop into the left eye 3 (three) times daily.     doxycycline (VIBRA-TABS) 100 MG tablet Take 100 mg by mouth at bedtime.    Fluticasone-Salmeterol (ADVAIR) 250-50  MCG/DOSE AEPB Inhale 1 puff into the lungs 2 (two) times daily.    furosemide (LASIX) 40 MG tablet Take 40 mg by mouth 2 (two) times daily.    gentamicin (GARAMYCIN) 0.3 % ophthalmic ointment Place 1 application into the left eye 3 (three) times daily.    glucosamine-chondroitin 500-400 MG tablet Take 1 tablet by mouth 2 (two) times daily.    lamoTRIgine (LAMICTAL) 200 MG tablet Take 200 mg by mouth daily.      levothyroxine (SYNTHROID, LEVOTHROID) 150 MCG tablet Take 150 mcg by mouth daily before breakfast.     Liraglutide (VICTOZA) 18 MG/3ML SOPN Inject 1.8 mg into the skin daily.    losartan (COZAAR) 50 MG tablet Take 50 mg by mouth daily.    metFORMIN (GLUCOPHAGE-XR) 500 MG 24 hr tablet Take 2 tablets (1,000 mg total) by mouth daily with breakfast. Qty: 180 tablet, Refills: 3    montelukast (SINGULAIR) 10 MG tablet Take 10 mg by mouth at bedtime.    Multiple Vitamin (MULTIVITAMIN WITH MINERALS) TABS tablet Take 1 tablet by mouth daily.    ondansetron (ZOFRAN) 8 MG tablet Take 8 mg by mouth every 8 (eight) hours as needed for nausea or vomiting.     Oxcarbazepine (TRILEPTAL) 300 MG tablet Take 300-600 mg by mouth 2 (two) times daily. Pt takes one tablet in the morning and two in the evening.    pantoprazole (PROTONIX) 40 MG tablet Take 1 tablet (40 mg total) by mouth daily. Qty: 90 tablet, Refills: 3    polyethylene glycol (MIRALAX / GLYCOLAX) packet Take 17 g by mouth daily as needed for mild constipation.     simvastatin (ZOCOR) 40 MG tablet Take 1 tablet (40 mg total) by mouth every evening. Qty: 90 tablet, Refills: 3    zaleplon (SONATA) 10 MG capsule Take 10-20 mg by mouth at bedtime as needed for sleep.         DISCHARGE INSTRUCTIONS:   Follow with GI clinic in 2 weeks.  If you experience worsening of your admission symptoms, develop shortness of breath, life threatening emergency, suicidal or homicidal thoughts you must seek medical attention immediately by  calling 911 or calling your MD immediately  if symptoms less severe.  You Must read complete instructions/literature along with all the possible adverse reactions/side effects for all the Medicines you take and that have been prescribed to you. Take any new Medicines after you have completely understood and accept all the possible adverse reactions/side effects.   Please note  You were cared for by a hospitalist during your hospital stay. If you have any questions about your discharge medications or the care you received while you were in the hospital after you are discharged, you can call the unit and asked  to speak with the hospitalist on call if the hospitalist that took care of you is not available. Once you are discharged, your primary care physician will handle any further medical issues. Please note that NO REFILLS for any discharge medications will be authorized once you are discharged, as it is imperative that you return to your primary care physician (or establish a relationship with a primary care physician if you do not have one) for your aftercare needs so that they can reassess your need for medications and monitor your lab values.    Today   CHIEF COMPLAINT:   Chief Complaint  Patient presents with  . Abdominal Pain  . Emesis    HISTORY OF PRESENT ILLNESS:  Roux Brandy  is a 65 y.o. female presents with persistent nausea and vomiting for the past 4 days. Patient has been having left lower quadrant pain, which is worst with bowel movements for the past several days. Is been accompanied by nausea and vomiting as well. She came to the ED for evaluation and workup was largely negative except for some indeterminate sigmoid thickening on CT abdomen and pelvis. She has had some long-standing history with recurrent constipation, and felt that this might have been the cause of her symptoms initially. She was sent home with treatment for the same. However since that time she has had  persistent nausea and vomiting, and abdominal pain. She came back in the ED today after seeing gastroenterology in their outpatient office due to the fact that they felt she likely needed IV fluids and pain control. Repeat workup in the ED today again showed some sigmoid thickening. The rest of her workup was largely negative. However, her pain and nausea and vomiting persisted despite several rounds of pain medicine and antiemetics. Hospitalists were called for admission.   VITAL SIGNS:  Blood pressure 121/59, pulse 102, temperature 97.8 F (36.6 C), temperature source Oral, resp. rate 18, height 5\' 4"  (1.626 m), weight 154.314 kg (340 lb 3.2 oz), SpO2 98 %.  I/O:   Intake/Output Summary (Last 24 hours) at 11/29/15 1437 Last data filed at 11/29/15 0600  Gross per 24 hour  Intake 1004.11 ml  Output   1500 ml  Net -495.89 ml    PHYSICAL EXAMINATION:   GENERAL: 65 y.o.-year-old patient lying in the bed with no acute distress.  EYES: Pupils equal, round, reactive to light and accommodation. No scleral icterus. Extraocular muscles intact.  HEENT: Head atraumatic, normocephalic. Oropharynx and nasopharynx clear.  NECK: Supple, no jugular venous distention. No thyroid enlargement, no tenderness.  LUNGS: Normal breath sounds bilaterally, no wheezing, rales,rhonchi or crepitation. No use of accessory muscles of respiration.  CARDIOVASCULAR: S1, S2 normal. No murmurs, rubs, or gallops.  ABDOMEN: Soft, mild tender, nondistended. Bowel sounds present. No organomegaly or mass.  EXTREMITIES: No pedal edema, cyanosis, or clubbing.  NEUROLOGIC: Cranial nerves II through XII are intact. Muscle strength 5/5 in all extremities. Sensation intact. Gait not checked.  PSYCHIATRIC: The patient is alert and oriented x 3.  SKIN: No obvious rash, lesion, or ulcer.   DATA REVIEW:   CBC  Recent Labs Lab 11/28/15 0646  WBC 9.5  HGB 12.0  HCT 35.6  PLT 281    Chemistries   Recent  Labs Lab 11/27/15 1251 11/28/15 0646  NA 139 138  K 3.7 3.8  CL 100* 104  CO2 28 28  GLUCOSE 153* 156*  BUN 14 16  CREATININE 0.69 0.57  CALCIUM 10.1 8.8*  AST 28  --  ALT 24  --   ALKPHOS 89  --   BILITOT 0.7  --     Cardiac Enzymes  Recent Labs Lab 11/26/15 0014  TROPONINI 0.03    Microbiology Results  Results for orders placed or performed during the hospital encounter of 11/27/15  MRSA PCR Screening     Status: None   Collection Time: 11/28/15  2:04 PM  Result Value Ref Range Status   MRSA by PCR NEGATIVE NEGATIVE Final    Comment:        The GeneXpert MRSA Assay (FDA approved for NASAL specimens only), is one component of a comprehensive MRSA colonization surveillance program. It is not intended to diagnose MRSA infection nor to guide or monitor treatment for MRSA infections.     RADIOLOGY:  Ct Abdomen Pelvis W Contrast  11/27/2015  CLINICAL DATA:  65 year old female with generalized abdominal cramping and constipation, with some vomiting and poor PO intake. EXAM: CT ABDOMEN AND PELVIS WITH CONTRAST TECHNIQUE: Multidetector CT imaging of the abdomen and pelvis was performed using the standard protocol following bolus administration of intravenous contrast. CONTRAST:  OMNIPAQUE IOHEXOL 300 MG/ML  SOLN COMPARISON:  CT the abdomen and pelvis 11/25/2015. FINDINGS: Lower chest: Calcifications of the mitral annulus. Lipomatous hypertrophy of the interatrial septum. Hepatobiliary: No cystic or solid hepatic lesions. No intra or extrahepatic biliary ductal dilatation. Amorphous high attenuation material lying dependently in the gallbladder, new compared to the recent prior examination, presumably vicarious excretion of contrast material from the recent prior CT examination. No findings to suggest an acute cholecystitis at this time. Pancreas: No pancreatic mass. No pancreatic ductal dilatation. No pancreatic or peripancreatic fluid or inflammatory changes. Spleen:  Unremarkable. Adrenals/Urinary Tract: Bilateral adrenal glands and bilateral kidneys are normal in appearance. No hydroureteronephrosis. Urinary bladder is normal in appearance. Stomach/Bowel: Normal appearance of the stomach. No pathologic dilatation of small bowel or colon. There is again some mural thickening in the mid sigmoid colon, however, this is poorly demonstrated on today's study secondary to decompression of this loop of bowel, and extensive beam hardening artifact from the patient's left hip arthroplasty. Normal appendix. Vascular/Lymphatic: Atherosclerosis throughout the abdominal and pelvic vasculature, without evidence of aneurysm or dissection. No lymphadenopathy noted in the abdomen or pelvis. Reproductive: Status post hysterectomy.  Ovaries are atrophic. Other: Small umbilical hernia containing only omental fat is unchanged. No significant volume of ascites. No pneumoperitoneum. Musculoskeletal: Status post PLIF at L3-L4 with an interbody graft at the L3-L4 interspace. Status post left total hip arthroplasty. Large lucent area in the left ilium adjacent to the acetabulum, potentially sequela of particle disease. There are no aggressive appearing lytic or blastic lesions noted in the visualized portions of the skeleton. IMPRESSION: 1. No acute findings in the abdomen or pelvis to account for the patient's symptoms. 2. Focal area of mural thickening in the mid sigmoid colon suspected on the prior study is less well demonstrated on today's examination secondary to under distention of this portion of the colon and obscuration by beam hardening artifact from the patient's left hip arthroplasty. As previously suggested, followup nonemergent colonoscopy is recommended in the near future to exclude underlying neoplasm. 3. Small umbilical hernia containing only omental fat. 4. Atherosclerosis. 5. Additional incidental findings, as above. Electronically Signed   By: Trudie Reed M.D.   On: 11/27/2015  15:44    Management plans discussed with the patient, family and they are in agreement.  CODE STATUS:     Code Status Orders  Start     Ordered   11/28/15 0055  Full code   Continuous     11/28/15 0054    Code Status History    Date Active Date Inactive Code Status Order ID Comments User Context   This patient has a current code status but no historical code status.      TOTAL TIME TAKING CARE OF THIS PATIENT: 35 minutes.    Altamese Dilling M.D on 11/29/2015 at 2:37 PM  Between 7am to 6pm - Pager - [phone]  After 6pm go to [email or web address] - password [email or web address]  [facility]  Office  [phone]  CC: Primary care physician; [name], MD   Note: This dictation was prepared with Dragon dictation along with smaller phrase technology. Any transcriptional errors that result from this process are unintentional.

## 2015-11-29 NOTE — Progress Notes (Signed)
Pt stable. IV removed. D/c instructions given and education provided. Prescriptions verified. Pt states she understands instructions. Pt dressed and escorted out by staff. Driven home by family.

## 2015-12-02 ENCOUNTER — Encounter: Payer: Self-pay | Admitting: Family Medicine

## 2015-12-05 ENCOUNTER — Encounter: Payer: Self-pay | Admitting: *Deleted

## 2015-12-06 ENCOUNTER — Encounter: Payer: Self-pay | Admitting: Family Medicine

## 2015-12-06 NOTE — Telephone Encounter (Signed)
error 

## 2015-12-11 ENCOUNTER — Ambulatory Visit (INDEPENDENT_AMBULATORY_CARE_PROVIDER_SITE_OTHER): Payer: BLUE CROSS/BLUE SHIELD | Admitting: Family Medicine

## 2015-12-11 ENCOUNTER — Encounter: Payer: Self-pay | Admitting: Family Medicine

## 2015-12-11 VITALS — BP 126/82 | HR 88 | Temp 97.3°F | Wt 348.5 lb

## 2015-12-11 DIAGNOSIS — K639 Disease of intestine, unspecified: Secondary | ICD-10-CM | POA: Diagnosis not present

## 2015-12-11 DIAGNOSIS — K5732 Diverticulitis of large intestine without perforation or abscess without bleeding: Secondary | ICD-10-CM | POA: Diagnosis not present

## 2015-12-11 DIAGNOSIS — Z6841 Body Mass Index (BMI) 40.0 and over, adult: Secondary | ICD-10-CM

## 2015-12-11 DIAGNOSIS — I700 Atherosclerosis of aorta: Secondary | ICD-10-CM | POA: Diagnosis not present

## 2015-12-11 DIAGNOSIS — E1142 Type 2 diabetes mellitus with diabetic polyneuropathy: Secondary | ICD-10-CM

## 2015-12-11 MED ORDER — MONTELUKAST SODIUM 10 MG PO TABS: 10.0000 mg | ORAL_TABLET | Freq: Every day | ORAL | Status: DC

## 2015-12-11 MED ORDER — FUROSEMIDE 40 MG PO TABS: 40.0000 mg | ORAL_TABLET | Freq: Two times a day (BID) | ORAL | Status: DC

## 2015-12-11 MED ORDER — LOSARTAN POTASSIUM 50 MG PO TABS: 50.0000 mg | ORAL_TABLET | Freq: Every day | ORAL | Status: DC

## 2015-12-11 MED ORDER — FLUCONAZOLE 150 MG PO TABS: 150.0000 mg | ORAL_TABLET | Freq: Once | ORAL | Status: DC

## 2015-12-11 NOTE — Progress Notes (Addendum)
BP 126/82 mmHg  Pulse 88  Temp(Src) 97.3 F (36.3 C) (Oral)  Wt 348 lb 8 oz (158.079 kg)   CC: hosp f/u visit  Subjective:    Patient ID: Amy Barnes, female    DOB: [birth year], 65 y.o.   MRN: [id number]  HPI: Amy Barnes is a 65 y.o. female presenting on 12/11/2015 for Follow-up   Recent hospitalization for intractable nausea/vomiting and obstipation. CT scan showed abnormal thickening of sigmoid colon. Treated for diverticulitis with cipro/flagyl. rec f/u with GI for colonoscopy. She has f/u scheduled for Thursday with Dr Shelle Iron.  Since she's been home, overall feeling better. Multiple stools on miralax. Denies fevers, abd pain, nausea/vomiting. Holding lasix helped soften stools. However notices leg swelling so she has restartd lasix. She's taking miralax 1/2 capful twice daily.   Requests diflucan after recent antibiotic course.  DATE OF ADMISSION: 11/27/2015 DATE OF DISCHARGE: 11/29/2015 No f/u phone call performed. Discharge diagnosis:  Intractable nausea/vomiting hypothyroidism DM type 2 with diabetic peripheral neuropathy (HCC) Essential hypertension GERD OSA on CPAP Abdominal pain Diverticulitis  Relevant past medical, surgical, family and social history reviewed and updated as indicated. Interim medical history since our last visit reviewed. Allergies and medications reviewed and updated. Current Outpatient Prescriptions on File Prior to Visit  Medication Sig  . albuterol (PROVENTIL HFA;VENTOLIN HFA) 108 (90 BASE) MCG/ACT inhaler Inhale 2 puffs into the lungs every 6 (six) hours as needed for wheezing or shortness of breath.   Marland Kitchen aspirin 81 MG EC tablet Take 81 mg by mouth at bedtime.   . Calcium Carbonate-Vitamin D (CALCIUM 600+D) 600-400 MG-UNIT tablet Take 1 tablet by mouth daily.  . celecoxib (CELEBREX) 200 MG capsule Take 1 capsule (200 mg total) by mouth 2 (two) times daily.  . clotrimazole (LOTRIMIN) 1 % cream Apply topically 2 (two) times daily. In groin    . doxycycline (VIBRA-TABS) 100 MG tablet Take 100 mg by mouth at bedtime.  . Fluticasone-Salmeterol (ADVAIR) 250-50 MCG/DOSE AEPB Inhale 1 puff into the lungs 2 (two) times daily.  Marland Kitchen lamoTRIgine (LAMICTAL) 200 MG tablet Take 200 mg by mouth daily.    Marland Kitchen levothyroxine (SYNTHROID, LEVOTHROID) 150 MCG tablet Take 150 mcg by mouth daily before breakfast.   . Linaclotide (LINZESS) 145 MCG CAPS capsule Take 1 capsule (145 mcg total) by mouth daily.  . Liraglutide (VICTOZA) 18 MG/3ML SOPN Inject 1.8 mg into the skin daily.  . metFORMIN (GLUCOPHAGE-XR) 500 MG 24 hr tablet Take 2 tablets (1,000 mg total) by mouth daily with breakfast.  . Multiple Vitamin (MULTIVITAMIN WITH MINERALS) TABS tablet Take 1 tablet by mouth daily.  . ondansetron (ZOFRAN) 8 MG tablet Take 8 mg by mouth every 8 (eight) hours as needed for nausea or vomiting.   . Oxcarbazepine (TRILEPTAL) 300 MG tablet Take 300-600 mg by mouth 2 (two) times daily. Pt takes one tablet in the morning and two in the evening.  . pantoprazole (PROTONIX) 40 MG tablet Take 1 tablet (40 mg total) by mouth daily.  . polyethylene glycol (MIRALAX / GLYCOLAX) packet Take 17 g by mouth daily as needed for mild constipation.   . simvastatin (ZOCOR) 40 MG tablet Take 1 tablet (40 mg total) by mouth every evening.  . zaleplon (SONATA) 10 MG capsule Take 10-20 mg by mouth at bedtime as needed for sleep. Reported on 12/11/2015  . glucosamine-chondroitin 500-400 MG tablet Take 1 tablet by mouth 2 (two) times daily. Reported on 12/11/2015  . oxyCODONE-acetaminophen (PERCOCET/ROXICET) 5-325 MG tablet  Take 1 tablet by mouth every 6 (six) hours as needed for moderate pain. (Patient not taking: Reported on 12/11/2015)   No current facility-administered medications on file prior to visit.    Review of Systems Per HPI unless specifically indicated in ROS section     Objective:    BP 126/82 mmHg  Pulse 88  Temp(Src) 97.3 F (36.3 C) (Oral)  Wt 348 lb 8 oz (158.079  kg)  Wt Readings from Last 3 Encounters:  12/11/15 348 lb 8 oz (158.079 kg)  11/28/15 340 lb 3.2 oz (154.314 kg)  11/25/15 350 lb (158.759 kg)    Physical Exam  Constitutional: She appears well-developed and well-nourished. No distress.  HENT:  Mouth/Throat: Oropharynx is clear and moist. No oropharyngeal exudate.  Cardiovascular: Normal rate, regular rhythm, normal heart sounds and intact distal pulses.   No murmur heard. Pulmonary/Chest: Effort normal and breath sounds normal. No respiratory distress. She has no wheezes. She has no rales.  Abdominal: Soft. Normal appearance and bowel sounds are normal. She exhibits no distension and no mass. There is no hepatosplenomegaly. There is tenderness (mild diffuse). There is no rigidity, no rebound, no guarding and negative Murphy's sign.  obese  Musculoskeletal: She exhibits edema (tender pitting and nonpitting lymphedema bilaterally).  Skin: Skin is warm and dry.  Nursing note and vitals reviewed.  Results for orders placed or performed in visit on 12/05/15  Comprehensive metabolic panel  Result Value Ref Range   Creat 0.69    Sodium 145    Potassium 4.9 mmol/L   Total Bilirubin 0.3 mg/dL   Alkaline Phosphatase 78 U/L   AST 19 U/L   ALT 21   Lipid panel  Result Value Ref Range   Cholesterol 133    Triglycerides 62    HDL 64    LDL (calc) 57   Hemoglobin A1c  Result Value Ref Range   A1c 6.0    CT ABDOMEN AND PELVIS WITH CONTRAST IMPRESSION: 1. No acute findings in the abdomen or pelvis to account for the patient's symptoms. 2. Focal area of mural thickening in the mid sigmoid colon suspected on the prior study is less well demonstrated on today's examination secondary to under distention of this portion of the colon and obscuration by beam hardening artifact from the patient's left hip arthroplasty. As previously suggested, followup nonemergent colonoscopy is recommended in the near future to exclude underlying neoplasm. 3.  Small umbilical hernia containing only omental fat. 4. Atherosclerosis. 5. Additional incidental findings, as above. Electronically Signed  By: Trudie Reed M.D.  On: 11/27/2015 15:44    Assessment & Plan:   Problem List Items Addressed This Visit    Mural thickening of sigmoid colon - Primary    Recent hospitalization for presumed diverticulitis, improving on cipro/flagyl.  Reviewed CT findings, discussed upcoming GI office visit.  linzess was started for constipation.      Morbid obesity with BMI of 60.0-69.9, adult (HCC)   DM type 2 with diabetic peripheral neuropathy (HCC)   Relevant Medications   losartan (COZAAR) 50 MG tablet   Abdominal aortic atherosclerosis (HCC)    Reviewed with patient.      Relevant Medications   furosemide (LASIX) 40 MG tablet   losartan (COZAAR) 50 MG tablet    Other Visit Diagnoses    Diverticulitis of colon            Follow up plan: No Follow-up on file.

## 2015-12-11 NOTE — Progress Notes (Signed)
Pre visit review using our clinic review tool, if applicable. No additional management support is needed unless otherwise documented below in the visit note. 

## 2015-12-11 NOTE — Assessment & Plan Note (Addendum)
Recent hospitalization for presumed diverticulitis, improving on cipro/flagyl.  Reviewed CT findings, discussed upcoming GI office visit.  linzess was started for constipation.

## 2015-12-11 NOTE — Assessment & Plan Note (Signed)
Reviewed with patient

## 2015-12-11 NOTE — Patient Instructions (Addendum)
Take lasix 40mg  once daily in am. Take miralax in am. Hold simvastatin for 2 days when you take diflucan for yeast. meds refilled. Keep appointment with GI.  To prevent diverticulitis, once feeling better, start high fiber diet.  Good to see you today, call us with questions.

## 2015-12-24 LAB — HM DIABETES EYE EXAM

## 2015-12-28 ENCOUNTER — Encounter: Payer: Self-pay | Admitting: *Deleted

## 2015-12-31 ENCOUNTER — Encounter: Payer: Self-pay | Admitting: *Deleted

## 2015-12-31 ENCOUNTER — Ambulatory Visit: Payer: BLUE CROSS/BLUE SHIELD | Admitting: Anesthesiology

## 2015-12-31 ENCOUNTER — Encounter
Admission: RE | Disposition: A | Discharge status: Home / Self Care | Payer: Self-pay | Source: Ambulatory Visit | Attending: Gastroenterology

## 2015-12-31 ENCOUNTER — Ambulatory Visit
Admission: RE | Admit: 2015-12-31 | Discharge: 2015-12-31 | Disposition: A | Discharge status: Home / Self Care | Payer: BLUE CROSS/BLUE SHIELD | Source: Ambulatory Visit | Attending: Gastroenterology | Admitting: Gastroenterology

## 2015-12-31 DIAGNOSIS — J45909 Unspecified asthma, uncomplicated: Secondary | ICD-10-CM | POA: Insufficient documentation

## 2015-12-31 DIAGNOSIS — K5730 Diverticulosis of large intestine without perforation or abscess without bleeding: Secondary | ICD-10-CM | POA: Diagnosis not present

## 2015-12-31 DIAGNOSIS — E119 Type 2 diabetes mellitus without complications: Secondary | ICD-10-CM | POA: Diagnosis not present

## 2015-12-31 DIAGNOSIS — Z7984 Long term (current) use of oral hypoglycemic drugs: Secondary | ICD-10-CM | POA: Diagnosis not present

## 2015-12-31 DIAGNOSIS — K219 Gastro-esophageal reflux disease without esophagitis: Secondary | ICD-10-CM | POA: Insufficient documentation

## 2015-12-31 DIAGNOSIS — F418 Other specified anxiety disorders: Secondary | ICD-10-CM | POA: Diagnosis not present

## 2015-12-31 DIAGNOSIS — J309 Allergic rhinitis, unspecified: Secondary | ICD-10-CM | POA: Insufficient documentation

## 2015-12-31 DIAGNOSIS — Z87891 Personal history of nicotine dependence: Secondary | ICD-10-CM | POA: Insufficient documentation

## 2015-12-31 DIAGNOSIS — Z79899 Other long term (current) drug therapy: Secondary | ICD-10-CM | POA: Diagnosis not present

## 2015-12-31 DIAGNOSIS — G4733 Obstructive sleep apnea (adult) (pediatric): Secondary | ICD-10-CM | POA: Diagnosis not present

## 2015-12-31 DIAGNOSIS — Z96642 Presence of left artificial hip joint: Secondary | ICD-10-CM | POA: Diagnosis not present

## 2015-12-31 DIAGNOSIS — K640 First degree hemorrhoids: Secondary | ICD-10-CM | POA: Diagnosis not present

## 2015-12-31 DIAGNOSIS — M1990 Unspecified osteoarthritis, unspecified site: Secondary | ICD-10-CM | POA: Insufficient documentation

## 2015-12-31 DIAGNOSIS — Z7982 Long term (current) use of aspirin: Secondary | ICD-10-CM | POA: Diagnosis not present

## 2015-12-31 DIAGNOSIS — Z7951 Long term (current) use of inhaled steroids: Secondary | ICD-10-CM | POA: Diagnosis not present

## 2015-12-31 DIAGNOSIS — F319 Bipolar disorder, unspecified: Secondary | ICD-10-CM | POA: Diagnosis not present

## 2015-12-31 DIAGNOSIS — R933 Abnormal findings on diagnostic imaging of other parts of digestive tract: Secondary | ICD-10-CM | POA: Diagnosis present

## 2015-12-31 DIAGNOSIS — Z8614 Personal history of Methicillin resistant Staphylococcus aureus infection: Secondary | ICD-10-CM | POA: Diagnosis not present

## 2015-12-31 DIAGNOSIS — I10 Essential (primary) hypertension: Secondary | ICD-10-CM | POA: Diagnosis not present

## 2015-12-31 HISTORY — PX: COLONOSCOPY WITH PROPOFOL: SHX5780

## 2015-12-31 LAB — GLUCOSE, CAPILLARY: Glucose-Capillary: 104 mg/dL — ABNORMAL HIGH (ref 65–99)

## 2015-12-31 SURGERY — COLONOSCOPY WITH PROPOFOL
Anesthesia: General

## 2015-12-31 MED ORDER — SODIUM CHLORIDE 0.9 % IV SOLN
INTRAVENOUS | Status: DC
  Administered 2015-12-31: 1000 mL via INTRAVENOUS

## 2015-12-31 MED ORDER — PROPOFOL 10 MG/ML IV BOLUS
INTRAVENOUS | Status: DC | PRN
  Administered 2015-12-31: 20 mg via INTRAVENOUS
  Administered 2015-12-31: 10 mg via INTRAVENOUS
  Administered 2015-12-31: 20 mg via INTRAVENOUS
  Administered 2015-12-31: 10 mg via INTRAVENOUS
  Administered 2015-12-31: 30 mg via INTRAVENOUS
  Administered 2015-12-31 (×4): 20 mg via INTRAVENOUS
  Administered 2015-12-31: 10 mg via INTRAVENOUS
  Administered 2015-12-31 (×3): 20 mg via INTRAVENOUS

## 2015-12-31 MED ORDER — MIDAZOLAM HCL 2 MG/2ML IJ SOLN
INTRAMUSCULAR | Status: DC | PRN
  Administered 2015-12-31: 1 mg via INTRAVENOUS

## 2015-12-31 NOTE — H&P (Signed)
Primary Care Physician:  [name], MD  Pre-Procedure History & Physical: HPI:  Amy Barnes is a 65 y.o. female is here for an colonoscopy.   Past Medical History  Diagnosis Date  . Anemia   . Osteoarthritis   . Asthma   . Diabetes mellitus   . GERD (gastroesophageal reflux disease)   . Allergic rhinitis   . Hypertension   . Bipolar affective disorder (HCC)     takes Synthroid meds for Bipolar  . Shortness of breath   . OSA (obstructive sleep apnea)     CPAP  . Depression with anxiety   . Status post revision of total hip replacement bilateral    prosthetic infection R 2013, L 2015  . Osteoarthritis   . History of MRSA infection 2015    left - 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 PO  . Septic arthritis (HCC) 10/11/2012    Past Surgical History  Procedure Laterality Date  . Total hip arthroplasty Left 2002  . Partial hysterectomy  1984    for mennorhagia, ovaries remain  . Foot surgery  1982    bone spur  . Neck surgery      Herniated disk C2,3,4  . Tmj arthroplasty  1982  . Hospitalized      for bipolar  . Carpal tunnel release  2002  . Cervical fusion  2012    C2/3/4  . Knee arthroscopy  09/01/2012    Dannielle Huh, MD;  Right  . I&d extremity  09/06/2012    Budd Palmer, MD; Right;  I&D of right thigh  . Nose surgery    . Revision total hip arthroplasty Left 06/2013  . I&d extremity Left 07/2013    wound vac - daily doxycycline indefinitely  . Joint replacement      Prior to Admission medications   Medication Sig Start Date End Date Taking? Authorizing Provider  albuterol (PROVENTIL HFA;VENTOLIN HFA) 108 (90 BASE) MCG/ACT inhaler Inhale 2 puffs into the lungs every 6 (six) hours as needed for wheezing or shortness of breath.    Yes Historical Provider, MD  aspirin 81 MG EC tablet Take 81 mg by mouth at bedtime.    Yes Historical Provider, MD  Calcium Carbonate-Vitamin D (CALCIUM 600+D) 600-400 MG-UNIT tablet Take 1 tablet by mouth daily.   Yes Historical  Provider, MD  celecoxib (CELEBREX) 200 MG capsule Take 1 capsule (200 mg total) by mouth 2 (two) times daily. 11/05/15  Yes [name], MD  clotrimazole (LOTRIMIN) 1 % cream Apply topically 2 (two) times daily. In groin 11/29/15  Yes Altamese Dilling, MD  Fluticasone-Salmeterol (ADVAIR) 250-50 MCG/DOSE AEPB Inhale 1 puff into the lungs 2 (two) times daily.   Yes Historical Provider, MD  furosemide (LASIX) 40 MG tablet Take 1 tablet (40 mg total) by mouth 2 (two) times daily. 12/11/15  Yes [name], MD  glucosamine-chondroitin 500-400 MG tablet Take 1 tablet by mouth 2 (two) times daily. Reported on 12/11/2015   Yes Historical Provider, MD  lamoTRIgine (LAMICTAL) 200 MG tablet Take 200 mg by mouth daily.     Yes Historical Provider, MD  levothyroxine (SYNTHROID, LEVOTHROID) 150 MCG tablet Take 150 mcg by mouth daily before breakfast.    Yes Historical Provider, MD  Liraglutide (VICTOZA) 18 MG/3ML SOPN Inject 1.8 mg into the skin daily.   Yes Historical Provider, MD  losartan (COZAAR) 50 MG tablet Take 1 tablet (50 mg total) by mouth daily. 12/11/15  Yes [name], MD  metFORMIN (GLUCOPHAGE-XR) 500  MG 24 hr tablet Take 2 tablets (1,000 mg total) by mouth daily with breakfast. 11/05/15  Yes [name], MD  montelukast (SINGULAIR) 10 MG tablet Take 1 tablet (10 mg total) by mouth at bedtime. 12/11/15  Yes [name], MD  Multiple Vitamin (MULTIVITAMIN WITH MINERALS) TABS tablet Take 1 tablet by mouth daily.   Yes Historical Provider, MD  ondansetron (ZOFRAN) 8 MG tablet Take 8 mg by mouth every 8 (eight) hours as needed for nausea or vomiting.    Yes Historical Provider, MD  Oxcarbazepine (TRILEPTAL) 300 MG tablet Take 300-600 mg by mouth 2 (two) times daily. Pt takes one tablet in the morning and two in the evening.   Yes Historical Provider, MD  oxyCODONE-acetaminophen (PERCOCET/ROXICET) 5-325 MG tablet Take 1 tablet by mouth every 6 (six) hours as needed for moderate pain.  11/29/15  Yes Altamese Dilling, MD  pantoprazole (PROTONIX) 40 MG tablet Take 1 tablet (40 mg total) by mouth daily. 11/05/15  Yes [name], MD  polyethylene glycol [facility] / GLYCOLAX) packet Take 17 g by mouth daily as needed for mild constipation.    Yes Historical Provider, MD  simvastatin (ZOCOR) 40 MG tablet Take 1 tablet (40 mg total) by mouth every evening. 11/05/15  Yes [name], MD  zaleplon (SONATA) 10 MG capsule Take 10-20 mg by mouth at bedtime as needed for sleep. Reported on 12/11/2015   Yes Historical Provider, MD  doxycycline (VIBRA-TABS) 100 MG tablet Take 100 mg by mouth at bedtime. Reported on 12/31/2015    Historical Provider, MD  fluconazole (DIFLUCAN) 150 MG tablet Take 1 tablet (150 mg total) by mouth once. Patient not taking: Reported on 12/31/2015 12/11/15   [name], MD  Linaclotide [facility]) 145 MCG CAPS capsule Take 1 capsule (145 mcg total) by mouth daily. 11/29/15   Altamese Dilling, MD    Allergies as of 12/18/2015 - Review Complete 12/11/2015  Allergen Reaction Noted  . Cephalexin Hives 12/29/2011  . Hydrocodone Other (See Comments) 09/02/2012  . Risperidone and related Other (See Comments) 09/02/2012  . Seroquel [quetiapine fumarate] Other (See Comments) 09/02/2012  . Sulfa antibiotics Rash 11/27/2015    Family History  Problem Relation Age of Onset  . Aneurysm Father 62    brain  . Alcohol abuse Father   . CAD Other     several siblings  . Anesthesia problems Neg Hx   . Hypotension Neg Hx   . Malignant hyperthermia Neg Hx   . Pseudochol deficiency Neg Hx   . Cancer Brother     prostate  . Cancer Father     possibly  . Diabetes Brother     Social History   Social History  . Marital Status: Married    Spouse Name: N/A  . Number of Children: 3  . Years of Education: N/A   Occupational History  . Unemployed    Social History Main Topics  . Smoking status: Former Smoker -- 1.50 packs/day    Types: Cigarettes    Quit  date: 04/29/2009  . Smokeless tobacco: Never Used  . Alcohol Use: Yes     Comment: very rare  . Drug Use: No  . Sexual Activity: No   Other Topics Concern  . 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 Neck:  Supple; no masses or thyromegaly. Lungs:  Clear throughout to auscultation.    Heart:  Regular rate and rhythm. Abdomen:  Soft, nontender and nondistended. Normal bowel sounds, without guarding, and without rebound.   Neurologic:  Alert and  oriented x4;  grossly normal neurologically.  Impression/Plan: Amy Barnes is here for an colonoscopy to be performed for abnormal CT  Risks, benefits, limitations, and alternatives regarding  colonoscopy have been reviewed with the patient.  Questions have been answered.  All parties agreeable.   Elnita Maxwell, MD  12/31/2015, 9:10 AM

## 2015-12-31 NOTE — Transfer of Care (Signed)
Immediate Anesthesia Transfer of Care Note  Patient: Amy Barnes  Procedure(s) Performed: Procedure(s): COLONOSCOPY WITH PROPOFOL (N/A)  Patient Location: PACU  Anesthesia Type:General  Level of Consciousness: awake  Airway & Oxygen Therapy: Patient Spontanous Breathing and Patient connected to nasal cannula oxygen  Post-op Assessment: Report given to RN and Post -op Vital signs reviewed and stable  Post vital signs: Reviewed  Last Vitals:  Filed Vitals:   12/31/15 0944 12/31/15 0952  BP:    Pulse:  92  Temp: 36.1 C   Resp:  27    Complications: No apparent anesthesia complications

## 2015-12-31 NOTE — Anesthesia Postprocedure Evaluation (Signed)
Anesthesia Post Note  Patient: Amy Barnes  Procedure(s) Performed: Procedure(s) (LRB): COLONOSCOPY WITH PROPOFOL (N/A)  Patient location during evaluation: PACU Anesthesia Type: General Level of consciousness: awake and alert Pain management: pain level controlled Vital Signs Assessment: post-procedure vital signs reviewed and stable Respiratory status: spontaneous breathing, nonlabored ventilation, respiratory function stable and patient connected to nasal cannula oxygen Cardiovascular status: blood pressure returned to baseline and stable Postop Assessment: no signs of nausea or vomiting Anesthetic complications: no    Last Vitals:  Filed Vitals:   12/31/15 0944 12/31/15 0952  BP:    Pulse:  92  Temp: 36.1 C   Resp:  27    Last Pain:  Filed Vitals:   12/31/15 0952  PainSc: Asleep                 Yevette Edwards

## 2015-12-31 NOTE — Discharge Instructions (Signed)

## 2015-12-31 NOTE — Op Note (Signed)
[facility] Patient Name: Amy Barnes Procedure Date: 12/31/2015 8:51 AM MRN: [id number] Account #: [id number] Date of Birth: 1951-06-27 Admit Type: Outpatient Age: 65 Room: [facility] ENDO ROOM 3 Gender: Female Note Status: Finalized Procedure:            Colonoscopy Indications:          Last colonoscopy: 2015, Abnormal CT of the GI tract Patient Profile:      This is a 65 year old female. Providers:            Rhona Raider. Shelle Iron, MD Referring MD:         [name] (Referring MD) Medicines:            Propofol per Anesthesia Complications:        No immediate complications. Procedure:            Pre-Anesthesia Assessment:                       - Prior to the procedure, a History and Physical was                        performed, and patient medications, allergies and                        sensitivities were reviewed. The patient's tolerance of                        previous anesthesia was reviewed.                       After obtaining informed consent, the colonoscope was                        passed under direct vision. Throughout the procedure,                        the patient's blood pressure, pulse, and oxygen                        saturations were monitored continuously. The                        Colonoscope was introduced through the anus and                        advanced to the the cecum, identified by appendiceal                        orifice and ileocecal valve. The colonoscopy was                        performed without difficulty. The patient tolerated the                        procedure well. The quality of the bowel preparation                        was good. Findings:      The perianal and digital rectal examinations were normal.      A few small and large-mouthed diverticula were found in the sigmoid  colon.      Internal hemorrhoids were found during retroflexion. The hemorrhoids       were Grade I (internal  hemorrhoids that do not prolapse).      The exam was otherwise without abnormality. Impression:           - Diverticulosis in the sigmoid colon.                       - Internal hemorrhoids.                       - The examination was otherwise normal.                       - No findings to correspond with CT abnormality.                       - No specimens collected. Recommendation:       - Observe patient in GI recovery unit.                       - High fiber diet.                       - Continue present medications.                       - Repeat colonoscopy in 10 years for screening purposes.                       - Return to referring physician.                       - The findings and recommendations were discussed with                        the patient.                       - The findings and recommendations were discussed with                        the patient's family. Procedure Code(s):    --- Professional ---                       [phone], Colonoscopy, flexible; diagnostic, including                        collection of specimen(s) by brushing or washing, when                        performed (separate procedure) Diagnosis Code(s):    --- Professional ---                       K64.0, First degree hemorrhoids                       K57.30, Diverticulosis of large intestine without                        perforation or abscess without bleeding  R93.3, Abnormal findings on diagnostic imaging of other                        parts of digestive tract CPT copyright 2016 [employer]. All rights reserved. The codes documented in this report are preliminary and upon coder review may  be revised to meet current compliance requirements. Kathalene Frames, MD 12/31/2015 9:44:51 AM This report has been signed electronically. Number of Addenda: 0 Note Initiated On: 12/31/2015 8:51 AM Scope Withdrawal Time: 0 hours 13 minutes 16 seconds  Total Procedure  Duration: 0 hours 20 minutes 20 seconds       [facility]

## 2015-12-31 NOTE — Anesthesia Preprocedure Evaluation (Addendum)
Anesthesia Evaluation  Patient identified by MRN, date of birth, ID band Patient awake    Reviewed: Allergy & Precautions, H&P , NPO status , Patient's Chart, lab work & pertinent test results, reviewed documented beta blocker date and time   Airway Mallampati: II   Neck ROM: full    Dental  (+) Poor Dentition   Pulmonary neg pulmonary ROS, shortness of breath and with exertion, asthma , sleep apnea and Continuous Positive Airway Pressure Ventilation , former smoker,    Pulmonary exam normal        Cardiovascular hypertension, + Peripheral Vascular Disease  negative cardio ROS Normal cardiovascular exam     Neuro/Psych PSYCHIATRIC DISORDERS  Neuromuscular disease negative neurological ROS  negative psych ROS   GI/Hepatic negative GI ROS, Neg liver ROS, GERD  ,  Endo/Other  negative endocrine ROSdiabetes  Renal/GU negative Renal ROS  negative genitourinary   Musculoskeletal   Abdominal   Peds  Hematology negative hematology ROS (+) anemia ,   Anesthesia Other Findings Past Medical History:   Anemia                                                       Osteoarthritis                                               Asthma                                                       Diabetes mellitus                                            GERD (gastroesophageal reflux disease)                       Allergic rhinitis                                            Hypertension                                                 Bipolar affective disorder (HCC)                               Comment:takes Synthroid meds for Bipolar   Shortness of breath                                          OSA (obstructive sleep apnea)  Comment:CPAP   Depression with anxiety                                      Status post revision of total hip replacement   bilateral      Comment:prosthetic infection R 2013, L  2015   Osteoarthritis                                               History of MRSA infection                       2015           Comment:left - 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 PO   Septic arthritis (HCC)                          10/11/2012  Past Surgical History:   TOTAL HIP ARTHROPLASTY                          Left 2002         PARTIAL HYSTERECTOMY                             1984           Comment:for mennorhagia, ovaries remain   FOOT SURGERY                                     1982           Comment:bone spur   NECK SURGERY                                                    Comment:Herniated disk C2,3,4   TMJ ARTHROPLASTY                                 1982         Hospitalized                                                    Comment:for bipolar   CARPAL TUNNEL RELEASE                            2002         CERVICAL FUSION                                  2012           Comment:C2/3/4   KNEE ARTHROSCOPY  09/01/2012      Comment:Steve Lucey, MD;  Right   I&D EXTREMITY                                    09/06/2012      Comment:Michael H Handy, MD; Right;  I&D of right thigh   NOSE SURGERY                                                  REVISION TOTAL HIP ARTHROPLASTY                 Left 06/2013      I&D EXTREMITY                                   Left 07/2013        Comment:wound vac - daily doxycycline indefinitely   Reproductive/Obstetrics                            Anesthesia Physical Anesthesia Plan  ASA: III  Anesthesia Plan: General   Post-op Pain Management:    Induction:   Airway Management Planned:   Additional Equipment:   Intra-op Plan:   Post-operative Plan:   Informed Consent: I have reviewed the patients History and Physical, chart, labs and discussed the procedure including the risks, benefits and alternatives for the proposed anesthesia with the patient or authorized representative who has  indicated his/her understanding and acceptance.   Dental Advisory Given  Plan Discussed with: CRNA  Anesthesia Plan Comments:        Anesthesia Quick Evaluation

## 2016-01-01 ENCOUNTER — Encounter: Payer: Self-pay | Admitting: Gastroenterology

## 2016-01-10 ENCOUNTER — Encounter: Payer: Self-pay | Admitting: Family Medicine

## 2016-01-22 ENCOUNTER — Other Ambulatory Visit: Payer: Self-pay | Admitting: Family Medicine

## 2016-02-19 ENCOUNTER — Ambulatory Visit (INDEPENDENT_AMBULATORY_CARE_PROVIDER_SITE_OTHER): Payer: BLUE CROSS/BLUE SHIELD | Admitting: Family Medicine

## 2016-02-19 ENCOUNTER — Ambulatory Visit (INDEPENDENT_AMBULATORY_CARE_PROVIDER_SITE_OTHER)
Admission: RE | Admit: 2016-02-19 | Discharge: 2016-02-19 | Disposition: A | Discharge status: Home / Self Care | Payer: BLUE CROSS/BLUE SHIELD | Source: Ambulatory Visit | Attending: Family Medicine | Admitting: Family Medicine

## 2016-02-19 ENCOUNTER — Encounter: Payer: Self-pay | Admitting: Family Medicine

## 2016-02-19 VITALS — BP 142/82 | HR 100 | Temp 98.4°F | Wt 374.5 lb

## 2016-02-19 DIAGNOSIS — M1990 Unspecified osteoarthritis, unspecified site: Secondary | ICD-10-CM

## 2016-02-19 DIAGNOSIS — G894 Chronic pain syndrome: Secondary | ICD-10-CM | POA: Diagnosis not present

## 2016-02-19 DIAGNOSIS — R600 Localized edema: Secondary | ICD-10-CM

## 2016-02-19 DIAGNOSIS — S99922A Unspecified injury of left foot, initial encounter: Secondary | ICD-10-CM | POA: Diagnosis not present

## 2016-02-19 DIAGNOSIS — S99929A Unspecified injury of unspecified foot, initial encounter: Secondary | ICD-10-CM | POA: Insufficient documentation

## 2016-02-19 MED ORDER — FLUCONAZOLE 150 MG PO TABS: 150.0000 mg | ORAL_TABLET | Freq: Once | ORAL | Status: DC

## 2016-02-19 NOTE — Assessment & Plan Note (Signed)
Severe osteoarthritis s/p hip and knee surgeries with complications. Will refer to pain clinic to establish care.

## 2016-02-19 NOTE — Assessment & Plan Note (Signed)
With 24lb weight gain over last few months - and pt endorses lasix not as effective. rec double dose x 3 days (80mg  bid) and update [place] with effect.

## 2016-02-19 NOTE — Progress Notes (Signed)
Pre visit review using our clinic review tool, if applicable. No additional management support is needed unless otherwise documented below in the visit note. 

## 2016-02-19 NOTE — Progress Notes (Signed)
BP 142/82 mmHg  Pulse 100  Temp(Src) 98.4 F (36.9 C) (Oral)  Wt 374 lb 8 oz (169.872 kg)   CC: foot injury  Subjective:    Patient ID: Amy Barnes, female    DOB: 08/11/1951, 65 y.o.   MRN: [id number]  HPI: Amy Barnes is a 65 y.o. female presenting on 02/19/2016 for Toe Pain and Referral   DOI: 1 wk ago 02/11/2016. Shovel fell on L toes. She did have fall Sunday in yard, lay on ground for 45 min. Monday small metal shovel fell onto L dorsal foot at 4th toe. Initially bruised, now better.   Treating with ice, ibuprofen, narcotic.   Also requests pain management referral for her chronic pain stemming from severe osteoarthritis with multiple complicated hip/knee surgeries and revisions (h/o septic arthritis 2014). Current pain regimen is oxycodone/apap 5/325 rare PRN breakthrough pain (last filled #20 11/29/2015) and celebrex  bid. She also has dilauded  to take PRN - again, rare use because of side effects of medication.   Has received "rooster's crow" knee injections which were helpful. She previously saw Dr Metta Clines.   Requests diflucan refill due to recurrent yeast infections on daily doxy (from h/o knee surgeries). She also uses topical antifungal (tiactin spray).   Relevant past medical, surgical, family and social history reviewed and updated as indicated. Interim medical history since our last visit reviewed. Allergies and medications reviewed and updated. Current Outpatient Prescriptions on File Prior to Visit  Medication Sig  . albuterol (PROVENTIL HFA;VENTOLIN HFA) 108 (90 BASE) MCG/ACT inhaler Inhale 2 puffs into the lungs every 6 (six) hours as needed for wheezing or shortness of breath.   Marland Kitchen aspirin 81 MG EC tablet Take 81 mg by mouth at bedtime.   . Calcium Carbonate-Vitamin D (CALCIUM 600+D) 600-400 MG-UNIT tablet Take 1 tablet by mouth daily.  . celecoxib (CELEBREX) 200 MG capsule Take 1 capsule (200 mg total) by mouth 2 (two) times daily.  . clotrimazole  (LOTRIMIN) 1 % cream Apply topically 2 (two) times daily. In groin  . doxycycline (VIBRA-TABS) 100 MG tablet Take 100 mg by mouth at bedtime. Reported on 12/31/2015  . Fluticasone-Salmeterol (ADVAIR) 250-50 MCG/DOSE AEPB Inhale 1 puff into the lungs 2 (two) times daily.  . furosemide (LASIX) 40 MG tablet Take 1 tablet (40 mg total) by mouth 2 (two) times daily.  Marland Kitchen glucosamine-chondroitin 500-400 MG tablet Take 1 tablet by mouth 2 (two) times daily. Reported on 12/11/2015  . lamoTRIgine (LAMICTAL) 200 MG tablet Take 200 mg by mouth daily.    Marland Kitchen levothyroxine (SYNTHROID, LEVOTHROID) 150 MCG tablet Take 150 mcg by mouth daily before breakfast.   . Linaclotide (LINZESS) 145 MCG CAPS capsule Take 1 capsule (145 mcg total) by mouth daily.  . Liraglutide (VICTOZA) 18 MG/3ML SOPN Inject 1.8 mg into the skin daily.  Marland Kitchen losartan (COZAAR) 50 MG tablet Take 1 tablet (50 mg total) by mouth daily.  . metFORMIN (GLUCOPHAGE-XR) 500 MG 24 hr tablet Take 2 tablets (1,000 mg total) by mouth daily with breakfast.  . montelukast (SINGULAIR) 10 MG tablet Take 1 tablet (10 mg total) by mouth at bedtime.  . Multiple Vitamin (MULTIVITAMIN WITH MINERALS) TABS tablet Take 1 tablet by mouth daily.  . ondansetron (ZOFRAN) 8 MG tablet Take 8 mg by mouth every 8 (eight) hours as needed for nausea or vomiting.   . Oxcarbazepine (TRILEPTAL) 300 MG tablet Take 300-600 mg by mouth 2 (two) times daily. Pt takes one tablet in the  morning and two in the evening.  Marland Kitchen oxyCODONE-acetaminophen (PERCOCET/ROXICET) 5-325 MG tablet Take 1 tablet by mouth every 6 (six) hours as needed for moderate pain.  . pantoprazole (PROTONIX) 40 MG tablet Take 1 tablet (40 mg total) by mouth daily.  . polyethylene glycol (MIRALAX / GLYCOLAX) packet Take 17 g by mouth daily as needed for mild constipation.   . simvastatin (ZOCOR) 40 MG tablet Take 1 tablet (40 mg total) by mouth every evening.  . zaleplon (SONATA) 10 MG capsule Take 10-20 mg by mouth at bedtime  as needed for sleep. Reported on 12/11/2015   No current facility-administered medications on file prior to visit.   Past Medical History  Diagnosis Date  . Anemia   . Osteoarthritis   . Asthma   . Diabetes mellitus   . GERD (gastroesophageal reflux disease)   . Allergic rhinitis   . Hypertension   . Bipolar affective disorder (HCC)     takes Synthroid meds for Bipolar  . Shortness of breath   . OSA (obstructive sleep apnea)     CPAP  . Depression with anxiety   . Status post revision of total hip replacement bilateral    prosthetic infection R 2013, L 2015  . Osteoarthritis   . History of MRSA infection 2015    left - 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 PO  . Septic arthritis (HCC) 10/11/2012    Past Surgical History  Procedure Laterality Date  . Total hip arthroplasty Left 2002  . Partial hysterectomy  1984    for mennorhagia, ovaries remain  . Foot surgery  1982    bone spur  . Neck surgery      Herniated disk C2,3,4  . Tmj arthroplasty  1982  . Hospitalized      for bipolar  . Carpal tunnel release  2002  . Cervical fusion  2012    C2/3/4  . Knee arthroscopy  09/01/2012    Dannielle Huh, MD;  Right  . I&d extremity  09/06/2012    Budd Palmer, MD; Right;  I&D of right thigh  . Nose surgery    . Revision total hip arthroplasty Left 06/2013  . I&d extremity Left 07/2013    wound vac - daily doxycycline indefinitely  . Joint replacement    . Colonoscopy with propofol N/A 12/31/2015    diverticulosis, int hem, o/w normal rpt 10 yrs (Rein)     Review of Systems Per HPI unless specifically indicated in ROS section     Objective:    BP 142/82 mmHg  Pulse 100  Temp(Src) 98.4 F (36.9 C) (Oral)  Wt 374 lb 8 oz (169.872 kg)  Wt Readings from Last 3 Encounters:  02/19/16 374 lb 8 oz (169.872 kg)  12/31/15 350 lb (158.759 kg)  12/11/15 348 lb 8 oz (158.079 kg)    Physical Exam  Constitutional: She appears well-developed and well-nourished. No distress.    Musculoskeletal: She exhibits edema (bilateral tender pitting 1+).  Diffusely tender to palpation entire dorsum of L foot, point tender 3rd/4th MT shafts, pain with axial loading of 3rd/4th digits 2+ DP R, 1+ DP on left Tender dorsal foot swelling present bilaterally  Skin: Skin is warm and dry. No rash noted.  Psychiatric:  Tearful with pain from light palpation during foot exam  Nursing note and vitals reviewed.  Results for orders placed or performed during the hospital encounter of 12/31/15  Glucose, capillary  Result Value Ref Range   Glucose-Capillary 104 (H) 65 -  99 mg/dL   Lab Results  Component Value Date   HGBA1C 6.2* 09/01/2012       Assessment & Plan:   Problem List Items Addressed This Visit    Osteoarthritis   Relevant Orders   Ambulatory referral to Pain Clinic   Foot injury - Primary    No obvious fracture/dislocation on my evaluation of xrays. Will await radiologist read. Treat presumed bony contusion/swelling with rest, ice, elevation, continue celebrex and pain meds she has at home as well as placed in post op shoe and buddy taping 4th/5th toes together. Update if not improving with treatment.      Relevant Orders   DG Foot Complete Left   Pedal edema    With 24lb weight gain over last few months - and pt endorses lasix not as effective. rec double dose x 3 days (80mg  bid) and update [place] with effect.       Chronic pain syndrome    Severe osteoarthritis s/p hip and knee surgeries with complications. Will refer to pain clinic to establish care.       Relevant Orders   Ambulatory referral to Pain Clinic       Follow up plan: Return if symptoms worsen or fail to improve.  [name], MD

## 2016-02-19 NOTE — Assessment & Plan Note (Signed)
No obvious fracture/dislocation on my evaluation of xrays. Will await radiologist read. Treat presumed bony contusion/swelling with rest, ice, elevation, continue celebrex and pain meds she has at home as well as placed in post op shoe and buddy taping 4th/5th toes together. Update if not improving with treatment.

## 2016-02-19 NOTE — Patient Instructions (Addendum)
Left foot xray today - overall ok, possible pinky toe injury, will await radiology evaluation. Treat with continued celebrex Use post op shoe and buddy taping last 2 toes together.  We will refer you back to Dr Letta Moynahan office for pain management - pass by our referral coordinators to set this up.  I will refill diflucan. If you take diflucan, hold simvastatin for 1-2 days each dose of diflucan.

## 2016-02-28 ENCOUNTER — Encounter: Payer: Self-pay | Admitting: Family Medicine

## 2016-02-29 NOTE — Telephone Encounter (Signed)
Can you check into pain clinic referral? Thanks. I did tell patient likely it will just take more time.

## 2016-02-29 NOTE — Telephone Encounter (Signed)
Im checking with Dr. Metta Clines office. Will keep you updated

## 2016-03-13 ENCOUNTER — Encounter: Payer: Self-pay | Admitting: Family Medicine

## 2016-03-13 ENCOUNTER — Ambulatory Visit (INDEPENDENT_AMBULATORY_CARE_PROVIDER_SITE_OTHER): Payer: BLUE CROSS/BLUE SHIELD | Admitting: Family Medicine

## 2016-03-13 VITALS — BP 144/78 | HR 92 | Temp 97.8°F | Wt 372.8 lb

## 2016-03-13 DIAGNOSIS — G894 Chronic pain syndrome: Secondary | ICD-10-CM

## 2016-03-13 DIAGNOSIS — M1990 Unspecified osteoarthritis, unspecified site: Secondary | ICD-10-CM

## 2016-03-13 DIAGNOSIS — I10 Essential (primary) hypertension: Secondary | ICD-10-CM

## 2016-03-13 DIAGNOSIS — E039 Hypothyroidism, unspecified: Secondary | ICD-10-CM | POA: Diagnosis not present

## 2016-03-13 DIAGNOSIS — E1142 Type 2 diabetes mellitus with diabetic polyneuropathy: Secondary | ICD-10-CM

## 2016-03-13 DIAGNOSIS — E7800 Pure hypercholesterolemia, unspecified: Secondary | ICD-10-CM | POA: Diagnosis not present

## 2016-03-13 DIAGNOSIS — F319 Bipolar disorder, unspecified: Secondary | ICD-10-CM

## 2016-03-13 LAB — BASIC METABOLIC PANEL
BUN: 23 mg/dL (ref 6–23)
CO2: 31 mEq/L (ref 19–32)
Calcium: 9.5 mg/dL (ref 8.4–10.5)
Chloride: 97 mEq/L (ref 96–112)
Creatinine, Ser: 0.69 mg/dL (ref 0.40–1.20)
GFR: 91.11 mL/min (ref >60.00)
Glucose, Bld: 141 mg/dL — ABNORMAL HIGH (ref 70–99)
Potassium: 4.6 mEq/L (ref 3.5–5.1)
Sodium: 132 mEq/L — ABNORMAL LOW (ref 135–145)

## 2016-03-13 LAB — LDL CHOLESTEROL, DIRECT: Direct LDL: 48 mg/dL

## 2016-03-13 LAB — HEMOGLOBIN A1C: Hgb A1c MFr Bld: 7 % — ABNORMAL HIGH (ref 4.6–6.5)

## 2016-03-13 LAB — TSH: TSH: 0.71 u[IU]/mL (ref 0.35–4.50)

## 2016-03-13 NOTE — Assessment & Plan Note (Signed)
Complicated knee history - multiple surgeries and revisions.  On ppx doxy 100mg  daily after h/o septic arthritis. Prior taking some unknown injection which helped. Requests referral back to ortho for further eval/treatment. Referral placed.

## 2016-03-13 NOTE — Patient Instructions (Addendum)
Check with insurance what is preferred glucose meter brand and let us know.  Sign release up front for eye exam report from [facility]. We will refer you to ortho to discuss knee injection options.  # for our counselors provided today labwork today. Good to see ou, return in 3-4 months for physical.

## 2016-03-13 NOTE — Assessment & Plan Note (Signed)
Chronic, stable. Check dLDL

## 2016-03-13 NOTE — Assessment & Plan Note (Signed)
Chronic, stable. Continue current regimen. 

## 2016-03-13 NOTE — Assessment & Plan Note (Signed)
Followed by psych. Requests psychology referral. # provided today for our counselors.

## 2016-03-13 NOTE — Progress Notes (Signed)
BP 144/78 mmHg  Pulse 92  Temp(Src) 97.8 F (36.6 C) (Oral)  Wt 372 lb 12 oz (169.078 kg)   CC: DM f/u visit  Subjective:    Patient ID: Amy Barnes, female    DOB: [birth year], 65 y.o.   MRN: [id number]  HPI: [name] is a 65 y.o. female presenting on 03/13/2016 for Follow-up   Seen here last month with foot injury with clear xrays - slowly improving. Placed in post op shoe which worsening her chronic hip arthritis contralateral leg. Last visit we also referred to pain clinic to establish care for chronic osteoarthritis pain - still awaiting response from Dr Metta Clines. Requests referral to Dr Kirsteins/Swartz at [facility] or orthopedic referral to discuss knee injection options. Very interested in knee injection.    DM - regularly does not check sugars.  Compliant with antihyperglycemic regimen which includes: victoza injection daily, metformin  XR daily. Denies low sugars or hypoglycemic symptoms. Denies paresthesias. Known L CTS s/p R CTR surgery. Last diabetic eye exam 2014. Pneumovax: 2013. Prevnar: not due yet. Ran out of strips (acura?) unsure if insurance will cover this.  Lab Results  Component Value Date   HGBA1C 6.2* 09/01/2012     HTN - Compliant with current antihypertensive regimen of losartan  daily and lasix  BID. Does not check blood pressures at home. No low blood pressure readings or symptoms of dizziness/syncope. Denies HA, vision changes, CP/tightness, SOB, leg swelling.   Bipolar - states thyroid replacement for bipolar disease not thyroid disease - overdue for TSH f/u. Compliant with levothyroxine daily.   HLD - compliant with simvastatin  daily without myalgias.   Relevant past medical, surgical, family and social history reviewed and updated as indicated. Interim medical history since our last visit reviewed. Allergies and medications reviewed and updated. Current Outpatient Prescriptions on File Prior to Visit  Medication Sig  .  albuterol (PROVENTIL HFA;VENTOLIN HFA) 108 (90 BASE) MCG/ACT inhaler Inhale 2 puffs into the lungs every 6 (six) hours as needed for wheezing or shortness of breath.   Marland Kitchen aspirin 81 MG EC tablet Take 81 mg by mouth at bedtime.   . Calcium Carbonate-Vitamin D (CALCIUM 600+D) 600-400 MG-UNIT tablet Take 1 tablet by mouth daily.  . celecoxib (CELEBREX) 200 MG capsule Take 1 capsule (200 mg total) by mouth 2 (two) times daily.  . clotrimazole (LOTRIMIN) 1 % cream Apply topically 2 (two) times daily. In groin  . doxycycline (VIBRA-TABS) 100 MG tablet Take 100 mg by mouth at bedtime. Reported on 12/31/2015  . fluconazole (DIFLUCAN) 150 MG tablet Take 1 tablet (150 mg total) by mouth once.  . Fluticasone-Salmeterol (ADVAIR) 250-50 MCG/DOSE AEPB Inhale 1 puff into the lungs 2 (two) times daily.  . furosemide (LASIX) 40 MG tablet Take 1 tablet (40 mg total) by mouth 2 (two) times daily.  Marland Kitchen glucosamine-chondroitin 500-400 MG tablet Take 1 tablet by mouth 2 (two) times daily. Reported on 12/11/2015  . lamoTRIgine (LAMICTAL) 200 MG tablet Take 200 mg by mouth daily.    Marland Kitchen levothyroxine (SYNTHROID, LEVOTHROID) 150 MCG tablet Take 150 mcg by mouth daily before breakfast.   . Linaclotide (LINZESS) 145 MCG CAPS capsule Take 1 capsule (145 mcg total) by mouth daily.  . Liraglutide (VICTOZA) 18 MG/3ML SOPN Inject 1.8 mg into the skin daily.  Marland Kitchen losartan (COZAAR) 50 MG tablet Take 1 tablet (50 mg total) by mouth daily.  . metFORMIN (GLUCOPHAGE-XR) 500 MG 24 hr tablet Take 2 tablets (  1,000 mg total) by mouth daily with breakfast.  . montelukast (SINGULAIR) 10 MG tablet Take 1 tablet (10 mg total) by mouth at bedtime.  . Multiple Vitamin (MULTIVITAMIN WITH MINERALS) TABS tablet Take 1 tablet by mouth daily.  . ondansetron (ZOFRAN) 8 MG tablet Take 8 mg by mouth every 8 (eight) hours as needed for nausea or vomiting.   . Oxcarbazepine (TRILEPTAL) 300 MG tablet Take 300-600 mg by mouth 2 (two) times daily. Pt takes one tablet  in the morning and two in the evening.  Marland Kitchen oxyCODONE-acetaminophen (PERCOCET/ROXICET) 5-325 MG tablet Take 1 tablet by mouth every 6 (six) hours as needed for moderate pain.  . pantoprazole (PROTONIX) 40 MG tablet Take 1 tablet (40 mg total) by mouth daily.  . polyethylene glycol (MIRALAX / GLYCOLAX) packet Take 17 g by mouth daily as needed for mild constipation.   . simvastatin (ZOCOR) 40 MG tablet Take 1 tablet (40 mg total) by mouth every evening.  . zaleplon (SONATA) 10 MG capsule Take 10-20 mg by mouth at bedtime as needed for sleep. Reported on 12/11/2015   No current facility-administered medications on file prior to visit.   Past Medical History  Diagnosis Date  . Anemia   . Osteoarthritis   . Asthma   . Diabetes mellitus   . GERD (gastroesophageal reflux disease)   . Allergic rhinitis   . Hypertension   . Bipolar affective disorder (HCC)     takes Synthroid meds for Bipolar  . Shortness of breath   . OSA (obstructive sleep apnea)     CPAP  . Depression with anxiety   . Status post revision of total hip replacement bilateral    prosthetic infection R 2013, L 2015  . Osteoarthritis   . History of MRSA infection 2015    left - 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 PO  . Septic arthritis (HCC) 10/11/2012    Past Surgical History  Procedure Laterality Date  . Total hip arthroplasty Left 2002  . Partial hysterectomy  1984    for mennorhagia, ovaries remain  . Foot surgery  1982    bone spur  . Neck surgery      Herniated disk C2,3,4  . Tmj arthroplasty  1982  . Hospitalized      for bipolar  . Carpal tunnel release  2002  . Cervical fusion  2012    C2/3/4  . Knee arthroscopy  09/01/2012    Dannielle Huh, MD;  Right  . I&d extremity  09/06/2012    Budd Palmer, MD; Right;  I&D of right thigh  . Nose surgery    . Revision total hip arthroplasty Left 06/2013  . I&d extremity Left 07/2013    wound vac - daily doxycycline indefinitely  . Joint replacement    . Colonoscopy with  propofol N/A 12/31/2015    diverticulosis, int hem, o/w normal rpt 10 yrs (Rein)    Family History  Problem Relation Age of Onset  . Aneurysm Father 62    brain  . Alcohol abuse Father   . CAD Other     several siblings  . Anesthesia problems Neg Hx   . Hypotension Neg Hx   . Malignant hyperthermia Neg Hx   . Pseudochol deficiency Neg Hx   . Cancer Brother     prostate  . Cancer Father     possibly  . Diabetes Brother     Social History  Substance Use Topics  . Smoking status: Former Smoker -- 1.50  packs/day    Types: Cigarettes    Quit date: 04/29/2009  . Smokeless tobacco: Never Used  . Alcohol Use: Yes     Comment: very rare     Review of Systems Per HPI unless specifically indicated in ROS section     Objective:    BP 144/78 mmHg  Pulse 92  Temp(Src) 97.8 F (36.6 C) (Oral)  Wt 372 lb 12 oz (169.078 kg)  Wt Readings from Last 3 Encounters:  03/13/16 372 lb 12 oz (169.078 kg)  02/19/16 374 lb 8 oz (169.872 kg)  12/31/15 350 lb (158.759 kg)    Physical Exam  Constitutional: She appears well-developed and well-nourished. No distress.  HENT:  Head: Normocephalic and atraumatic.  Right Ear: External ear normal.  Left Ear: External ear normal.  Nose: Nose normal.  Mouth/Throat: Oropharynx is clear and moist. No oropharyngeal exudate.  Eyes: Conjunctivae and EOM are normal. Pupils are equal, round, and reactive to light. No scleral icterus.  Neck: Normal range of motion. Neck supple.  Cardiovascular: Normal rate, regular rhythm, normal heart sounds and intact distal pulses.   No murmur heard. Pulmonary/Chest: Effort normal and breath sounds normal. No respiratory distress. She has no wheezes. She has no rales.  Musculoskeletal: She exhibits no edema.  See HPI for foot exam if done  Lymphadenopathy:    She has no cervical adenopathy.  Skin: Skin is warm and dry. No rash noted.  Psychiatric: She has a normal mood and affect.  Nursing note and vitals  reviewed.  Results for orders placed or performed in visit on 03/13/16  HM DIABETES EYE EXAM  Result Value Ref Range   HM Diabetic Eye Exam No Retinopathy No Retinopathy   Lab Results  Component Value Date   TSH 0.482 09/26/2012       Assessment & Plan:   Problem List Items Addressed This Visit    Hypothyroidism    Denies h/o hypothyroid - levothyroxine is for bipolar disease followed by psych. Check TSH today.       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 Continue current regimen. Check labs today.       Relevant Orders   Hemoglobin A1c   HYPERCHOLESTEROLEMIA    Chronic, stable. Check dLDL      Relevant Orders   LDL Cholesterol, Direct   Bipolar disorder, unspecified (HCC)    Followed by psych. Requests psychology referral. # provided today for our counselors.      Essential hypertension    Chronic, stable. Continue current regimen.       Relevant Orders   Basic metabolic panel   Osteoarthritis    Complicated knee history - multiple surgeries and revisions.  On ppx doxy 100mg  daily after h/o septic arthritis. Prior taking some unknown injection which helped. Requests referral back to ortho for further eval/treatment. Referral placed.       Relevant Orders   Ambulatory referral to Orthopedic Surgery   Chronic pain syndrome    Awaiting response by Dr Letta Moynahan office.           Follow up plan: Return in about 3 months (around 06/13/2016), or as needed, for annual exam, prior fasting for blood work.  [name], MD

## 2016-03-13 NOTE — Assessment & Plan Note (Signed)
Chronic, stable. Continue current regimen. Check labs today.  

## 2016-03-13 NOTE — Assessment & Plan Note (Signed)
Awaiting response by Dr Letta Moynahan office.

## 2016-03-13 NOTE — Progress Notes (Signed)
Pre visit review using our clinic review tool, if applicable. No additional management support is needed unless otherwise documented below in the visit note. 

## 2016-03-13 NOTE — Assessment & Plan Note (Signed)
Denies h/o hypothyroid - levothyroxine is for bipolar disease followed by psych. Check TSH today.

## 2016-03-31 ENCOUNTER — Encounter: Payer: Self-pay | Admitting: Family Medicine

## 2016-04-06 ENCOUNTER — Encounter: Payer: Self-pay | Admitting: Family Medicine

## 2016-04-17 ENCOUNTER — Encounter: Payer: Self-pay | Admitting: Pain Medicine

## 2016-04-17 ENCOUNTER — Ambulatory Visit: Payer: BLUE CROSS/BLUE SHIELD | Attending: Pain Medicine | Admitting: Pain Medicine

## 2016-04-17 VITALS — BP 144/75 | HR 94 | Temp 98.2°F | Resp 18 | Ht 64.0 in | Wt 370.0 lb

## 2016-04-17 DIAGNOSIS — Q761 Klippel-Feil syndrome: Secondary | ICD-10-CM | POA: Insufficient documentation

## 2016-04-17 DIAGNOSIS — M25562 Pain in left knee: Secondary | ICD-10-CM | POA: Diagnosis present

## 2016-04-17 DIAGNOSIS — K219 Gastro-esophageal reflux disease without esophagitis: Secondary | ICD-10-CM | POA: Insufficient documentation

## 2016-04-17 DIAGNOSIS — Z96642 Presence of left artificial hip joint: Secondary | ICD-10-CM | POA: Diagnosis not present

## 2016-04-17 DIAGNOSIS — M62830 Muscle spasm of back: Secondary | ICD-10-CM | POA: Insufficient documentation

## 2016-04-17 DIAGNOSIS — I10 Essential (primary) hypertension: Secondary | ICD-10-CM | POA: Diagnosis not present

## 2016-04-17 DIAGNOSIS — Z9889 Other specified postprocedural states: Secondary | ICD-10-CM | POA: Insufficient documentation

## 2016-04-17 DIAGNOSIS — M170 Bilateral primary osteoarthritis of knee: Secondary | ICD-10-CM

## 2016-04-17 DIAGNOSIS — J45909 Unspecified asthma, uncomplicated: Secondary | ICD-10-CM | POA: Insufficient documentation

## 2016-04-17 DIAGNOSIS — E1140 Type 2 diabetes mellitus with diabetic neuropathy, unspecified: Secondary | ICD-10-CM | POA: Diagnosis not present

## 2016-04-17 DIAGNOSIS — G5603 Carpal tunnel syndrome, bilateral upper limbs: Secondary | ICD-10-CM

## 2016-04-17 DIAGNOSIS — Z981 Arthrodesis status: Secondary | ICD-10-CM | POA: Diagnosis not present

## 2016-04-17 DIAGNOSIS — M545 Low back pain: Secondary | ICD-10-CM | POA: Diagnosis present

## 2016-04-17 DIAGNOSIS — M160 Bilateral primary osteoarthritis of hip: Secondary | ICD-10-CM

## 2016-04-17 DIAGNOSIS — M5136 Other intervertebral disc degeneration, lumbar region: Secondary | ICD-10-CM | POA: Diagnosis not present

## 2016-04-17 DIAGNOSIS — F319 Bipolar disorder, unspecified: Secondary | ICD-10-CM | POA: Diagnosis not present

## 2016-04-17 DIAGNOSIS — M25561 Pain in right knee: Secondary | ICD-10-CM | POA: Diagnosis present

## 2016-04-17 DIAGNOSIS — D649 Anemia, unspecified: Secondary | ICD-10-CM | POA: Diagnosis not present

## 2016-04-17 DIAGNOSIS — M5030 Other cervical disc degeneration, unspecified cervical region: Secondary | ICD-10-CM | POA: Diagnosis not present

## 2016-04-17 DIAGNOSIS — M9904 Segmental and somatic dysfunction of sacral region: Secondary | ICD-10-CM | POA: Insufficient documentation

## 2016-04-17 DIAGNOSIS — G90523 Complex regional pain syndrome I of lower limb, bilateral: Secondary | ICD-10-CM

## 2016-04-17 DIAGNOSIS — M169 Osteoarthritis of hip, unspecified: Secondary | ICD-10-CM | POA: Insufficient documentation

## 2016-04-17 NOTE — Patient Instructions (Addendum)
PLAN  Continue present medications  Genicular nerve blocks of knee to be performed at time return appointment  F/U PCP Dr.Gutierrez  for evaluation of  BP and general medical  condition  F/U surgical evaluation. Follow-up with Dr. Lovell Sheehan, neurosurgeon, and [facility] orthopedic surgeon, As discussed,    F/U neurological evaluation. May consider pending follow-up evaluations  May consider radiofrequency rhizolysis or intraspinal procedures pending response to present treatment and F/U evaluation   Patient to call Pain Management Center should patient have concerns prior to scheduled return appointment.

## 2016-04-17 NOTE — Progress Notes (Signed)
Safety precautions to be maintained throughout the outpatient stay will include: orient to surroundings, keep bed in low position, maintain call bell within reach at all times, provide assistance with transfer out of bed and ambulation.  

## 2016-04-17 NOTE — Progress Notes (Signed)
Subjective:    Patient ID: Amy Barnes, female    DOB: December 23, 1950, 65 y.o.   MRN: [id number]  HPI  The patient is a 65 year old female who comes to pain management at the request of Dr. [name] for further evaluation and treatment of pain involving the lower back lower extremity region knees hips especially. 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 We discussed patient's condition on today's visit and patient stated that her pain was aching and annoying sharp uncomfortable sensation awakening patient from sleep and interfered the patient ability to go to sleep. The patient stated the pain increased with bending climbing kneeling standing sitting squatting stooping walking working and that the pain decreased with cold packs resting sitting and medications. The patient is undergone prior Synvisc injections of the knee at pain clinic prior to coming to pain management Center and recently underwent evaluation by orthopedic surgeon in [facility] who informed patient that she was not a candidate for Synvisc injections and was not a candidate for surgical replacement of the knee at this time. We informed patient that we could consider patient for interventional treatment in the region of the knee pending further assessment of patient's condition and follow-up evaluations. We'll consider additional modifications of treatment regimen and interventional treatment pending results of additional studies. We informed patient that we will be unable to promise that we would be able to treat her and that we would be unable to promise that we will provide any  medications as well until further assessment of her condition. All agreed to suggested treatment plan    Review of Systems     Cardiovascular: High blood pressure  Pulmonary: Asthma  Neurological: Unremarkable  Psychological: Bipolar  Gastrointestinal: Gastroesophageal reflux disease  Genitourinary: Unremarkable  Hematologic: Anemia  Endocrine: Diabetes mellitus  Rheumatological: Unremarkable  Musculoskeletal: Unremarkable  Other significant: Morbid obesity      Objective:   Physical Exam  There was tends to palpation of the splenius capitis and occipitalis region palpation which be produced moderate discomfort with moderate tenderness of the acromioclavicular and glenohumeral joint regions. The patient was with increased pain of moderate degree with Tinel and Phalen's maneuver. There appeared to be with decreased grip strength. There was well-healed surgical scar of the cervical region without increased warmth and erythema in the region of the scar. Palpation of the cervical and thoracic facet regions reproduces moderate discomfort. There was unremarkable Spurling's maneuver and patient was with difficulty attempting to perform drop test. Palpation over the lumbar region was attends to palpation of moderately severe degree with lateral bending rotation extension and palpation over the lumbar facets reproducing moderately severe discomfort. There was well-healed surgical scar of the lumbar region without increased warmth erythema in the region scar. Palpation over the lumbar region was with evidence of significant muscle spasm as well. Palpation over the PSIS and PII S region reproduced moderate to moderately severe discomfort. There was mild tenderness along the greater trochanteric region iliotibial band region. Knees were tenderness to palpation with crepitus of the knees with negative anterior and posterior drawer signs and without ballottement of the patella. No  increased warmth and erythema noted in  the region knees. EHL strength appeared to be decreased. No definite sensory deficit of dermatomal distribution detected. There was negative clonus negative Homans. Abdomen nontender and pendulous with no costovertebral tenderness noted      Assessment & Plan:   Degenerative disc disease lumbar spine  Status post lumbar fusion  Lumbar facet syndrome  Degenerative disc disease cervical spine  Status post cervical fusion  Cervical facet syndrome  Status post total hip replacement on the left  Degenerative joint disease of knees  Diabetes mellitus with diabetic neuropathy  Status post carpal tunnel surgery of left and right upper extremities       PLAN  Continue present medications  Genicular nerve blocks of knee to be performed at time return appointment  F/U PCP Dr.Gutierrez  for evaluation of  BP and general medical  condition  F/U surgical evaluation. Follow-up with Dr. Lovell Sheehan, neurosurgeon, and [facility] orthopedic surgeon, As discussed,    F/U neurological evaluation. May consider pending follow-up evaluations  May consider radiofrequency rhizolysis or intraspinal procedures pending response to present treatment and F/U evaluation   Patient to call Pain Management Center should patient have concerns prior to scheduled return appointment.   Sacroiliac joint dysfunction

## 2016-04-17 NOTE — Progress Notes (Signed)
   Subjective:    Patient ID: Amy Barnes, female    DOB: [birth year], 65 y.o.   MRN: [id number]  HPI    Review of Systems     Objective:   Physical Exam        Assessment & Plan:

## 2016-04-22 ENCOUNTER — Other Ambulatory Visit: Payer: Self-pay

## 2016-04-24 ENCOUNTER — Telehealth: Payer: Self-pay

## 2016-04-24 NOTE — Telephone Encounter (Signed)
Pt is calling to give the phone number to the nurses of where she got her pain meds filled Dr Metta Clines told her to call back with this information [facility] [phone]

## 2016-05-01 LAB — TOXASSURE SELECT 13 (MW), URINE: PDF: 0

## 2016-05-01 NOTE — Progress Notes (Signed)
Reviewed

## 2016-05-14 ENCOUNTER — Ambulatory Visit: Payer: BLUE CROSS/BLUE SHIELD | Attending: Pain Medicine | Admitting: Pain Medicine

## 2016-05-14 ENCOUNTER — Encounter: Payer: Self-pay | Admitting: Pain Medicine

## 2016-05-14 VITALS — BP 140/70 | HR 75 | Temp 98.0°F | Resp 14 | Ht 64.0 in | Wt 360.0 lb

## 2016-05-14 DIAGNOSIS — Z981 Arthrodesis status: Secondary | ICD-10-CM

## 2016-05-14 DIAGNOSIS — M1711 Unilateral primary osteoarthritis, right knee: Secondary | ICD-10-CM | POA: Diagnosis not present

## 2016-05-14 DIAGNOSIS — M25561 Pain in right knee: Secondary | ICD-10-CM | POA: Diagnosis present

## 2016-05-14 DIAGNOSIS — M79606 Pain in leg, unspecified: Secondary | ICD-10-CM | POA: Diagnosis present

## 2016-05-14 DIAGNOSIS — M545 Low back pain: Secondary | ICD-10-CM | POA: Diagnosis present

## 2016-05-14 DIAGNOSIS — M170 Bilateral primary osteoarthritis of knee: Secondary | ICD-10-CM

## 2016-05-14 MED ORDER — DOXYCYCLINE HYCLATE 100 MG PO TABS: 100.0000 mg | ORAL_TABLET | Freq: Two times a day (BID) | ORAL | Status: DC

## 2016-05-14 MED ORDER — TRIAMCINOLONE ACETONIDE 40 MG/ML IJ SUSP
40.0000 mg | Freq: Once | INTRAMUSCULAR | Status: AC
  Administered 2016-05-14: 40 mg
  Filled 2016-05-14: qty 1

## 2016-05-14 MED ORDER — MIDAZOLAM HCL 5 MG/5ML IJ SOLN
5.0000 mg | Freq: Once | INTRAMUSCULAR | Status: AC
  Administered 2016-05-14: 5 mg via INTRAVENOUS
  Filled 2016-05-14: qty 5

## 2016-05-14 MED ORDER — BUPIVACAINE HCL (PF) 0.25 % IJ SOLN
30.0000 mL | Freq: Once | INTRAMUSCULAR | Status: AC
  Administered 2016-05-14: 10 mL
  Filled 2016-05-14: qty 30

## 2016-05-14 MED ORDER — LIDOCAINE HCL (PF) 1 % IJ SOLN
10.0000 mL | Freq: Once | INTRAMUSCULAR | Status: AC
  Administered 2016-05-14: 10 mL via SUBCUTANEOUS
  Filled 2016-05-14: qty 10

## 2016-05-14 MED ORDER — FENTANYL CITRATE (PF) 100 MCG/2ML IJ SOLN
100.0000 ug | Freq: Once | INTRAMUSCULAR | Status: AC
  Administered 2016-05-14: 100 ug via INTRAVENOUS
  Filled 2016-05-14: qty 2

## 2016-05-14 MED ORDER — LACTATED RINGERS IV SOLN: 1000.0000 mL | INTRAVENOUS | Status: DC

## 2016-05-14 NOTE — Progress Notes (Signed)
      Genicular nerve blocks of the right knee   The patient is a 65 y.o. female who returns to the Pain Management Center for further evaluation and treatment of pain involving the lumbar lower extremity region with severe pain of the right knee. Prior studies reveal patient to be with significant degenerative joint disease of the knee.  We will proceed with genicular nerve blocks of the right knee in an attempt to decrease severity of symptoms, minimize the risk of medication escalation, hopefully retard progression of symptoms and avoid the need for more involved treatment.  The risks benefits and expectations of the procedure were discussed with the patient. The patient was with understanding and in agreement with suggested treatment plan.  DESCRIPTION OF PROCEDURE: Genicular nerve blocks of the right knee. The  procedure was performed with IV Versed and IV fentanyl, conscious sedation and under fluoroscopic guidance.  NEEDLE PLACEMENT FOR BLOCK OF THE LATERAL SUPERIOR GENICULAR NERVE: The patient was taking to the fluoroscopy suite. With the patient supine, with knee in flexed position, Betadine prep of proposed entry site accomplished.  IV Versed, IV fentanyl conscious sedation, EKG, blood pressure, pulse, capnography, and pulse oximetry monitoring were all in place. Under fluoroscopic guidance, a 22-gauge needle was inserted in the region of the right knee with needle placed at the lateral border of the femur at the junction of the shaft of the femur and the condyle of the femur.  Following needle placement at the lateral aspect of the knee, needle placement was then accomplished in the region of the medial aspect of the knee.  NEEDLE PLACEMENT FOR BLOCK OF THE MEDIAL SUPERIOR GENICULAR NERVE:  Under fluoroscopic guidance, a 22 - gauge needle was inserted in the region of the right knee with needle placed at the medial border of the femur at the junction of the shaft of the femur and the condyle  of the femur.   NEEDLE PLACEMENT FOR BLOCK OF THE MEDIAL INFERIOR GENICULAR NERVE:  Under fluoroscopic guidance, a 22 - gauge needle was inserted in the region of the right knee with needle placed at the junction of the shaft and plateau of the tibia.   Following needle placement on AP view of needles placed in all three locations, placement was then verified on lateral view with the tips of the superior lateral and superior medial needles documented to be one half the distance of the shaft of the femur and the tip of the inferior medial geniculate needle documented to be one half the distance of the shaft of the tibia.  Following documentation of needle placements on lateral view, each needle was injected with one mL of 0.25% bupivacaine with Kenalog. A total of 10 mg of Kenalog was utilized for the entire procedure. The patient tolerated the procedure well.    PLAN 1. Medications: Continue present medications 2. Follow-up appointment with PCP Dr.Gutierrez  for evaluation of blood pressure and general medical condition. 3. Follow-up surgical evaluation as discussed  4. Follow-up neurological evaluation. May consider PNCV EMG studies and other studies  5. The patient may be a candidate for radiofrequency rhizolysis and other treatment pending response to treatment on today's visit and follow-up evaluation. 6. The patient is advised to adhere to proper body mechanics and avoid activities which appear to aggravate condition

## 2016-05-14 NOTE — Progress Notes (Signed)
Safety precautions to be maintained throughout the outpatient stay will include: orient to surroundings, keep bed in low position, maintain call bell within reach at all times, provide assistance with transfer out of bed and ambulation.  

## 2016-05-14 NOTE — Patient Instructions (Addendum)
PLAN  Continue present medications. Please obtain doxycycline antibiotic today and begin taking doxycycline antibiotic today as prescribed  F/U PCP Dr.Gutierrez  for evaluation of  BP and general medical  condition  F/U surgical evaluation. Follow-up with Dr. Lovell Sheehan, neurosurgeon, and [facility] orthopedic surgeon, As discussed,    F/U neurological evaluation. May consider pending follow-up evaluations  May consider radiofrequency rhizolysis or intraspinal procedures pending response to present treatment and F/U evaluation   Patient to call Pain Management Center should patient have concerns prior to scheduled return appointment. Pain Management Discharge Instructions  General Discharge Instructions :  If you need to reach your doctor call: Monday-Friday 8:00 am - 4:00 pm at [phone] or toll free [phone].  After clinic hours [phone] to have operator reach doctor.  Bring all of your medication bottles to all your appointments in the pain clinic.  To cancel or reschedule your appointment with Pain Management please remember to call 24 hours in advance to avoid a fee.  Refer to the educational materials which you have been given on: General Risks, I had my Procedure. Discharge Instructions, Post Sedation.  Post Procedure Instructions:  The drugs you were given will stay in your system until tomorrow, so for the next 24 hours you should not drive, make any legal decisions or drink any alcoholic beverages.  You may eat anything you prefer, but it is better to start with liquids then soups and crackers, and gradually work up to solid foods.  Please notify your doctor immediately if you have any unusual bleeding, trouble breathing or pain that is not related to your normal pain.  Depending on the type of procedure that was done, some parts of your body may feel week and/or numb.  This usually clears up by tonight or the next day.  Walk with the use of an assistive device or  accompanied by an adult for the 24 hours.  You may use ice on the affected area for the first 24 hours.  Put ice in a Ziploc bag and cover with a towel and place against area 15 minutes on 15 minutes off.  You may switch to heat after 24 hours.

## 2016-05-15 NOTE — Telephone Encounter (Signed)
Patient denies any needs during call- states only that she is sore.

## 2016-05-22 ENCOUNTER — Encounter: Payer: Self-pay | Admitting: Family Medicine

## 2016-05-22 ENCOUNTER — Ambulatory Visit (INDEPENDENT_AMBULATORY_CARE_PROVIDER_SITE_OTHER): Payer: BLUE CROSS/BLUE SHIELD | Admitting: Family Medicine

## 2016-05-22 ENCOUNTER — Ambulatory Visit (INDEPENDENT_AMBULATORY_CARE_PROVIDER_SITE_OTHER)
Admission: RE | Admit: 2016-05-22 | Discharge: 2016-05-22 | Disposition: A | Discharge status: Home / Self Care | Payer: BLUE CROSS/BLUE SHIELD | Source: Ambulatory Visit | Attending: Family Medicine | Admitting: Family Medicine

## 2016-05-22 VITALS — BP 130/80 | HR 82 | Temp 97.3°F | Wt 375.5 lb

## 2016-05-22 DIAGNOSIS — J4520 Mild intermittent asthma, uncomplicated: Secondary | ICD-10-CM

## 2016-05-22 DIAGNOSIS — R3915 Urgency of urination: Secondary | ICD-10-CM

## 2016-05-22 DIAGNOSIS — Z6841 Body Mass Index (BMI) 40.0 and over, adult: Secondary | ICD-10-CM

## 2016-05-22 DIAGNOSIS — R0609 Other forms of dyspnea: Secondary | ICD-10-CM

## 2016-05-22 LAB — CBC WITH DIFFERENTIAL/PLATELET
Basophils Absolute: 0 10*3/uL (ref 0.0–0.1)
Basophils Relative: 0.4 % (ref 0.0–3.0)
Eosinophils Absolute: 0.2 10*3/uL (ref 0.0–0.7)
Eosinophils Relative: 2.4 % (ref 0.0–5.0)
HCT: 36.1 % (ref 36.0–46.0)
Hemoglobin: 12.1 g/dL (ref 12.0–15.0)
Lymphocytes Relative: 27 % (ref 12.0–46.0)
Lymphs Abs: 2.1 10*3/uL (ref 0.7–4.0)
MCHC: 33.3 g/dL (ref 30.0–36.0)
MCV: 86.1 fl (ref 78.0–100.0)
Monocytes Absolute: 0.7 10*3/uL (ref 0.1–1.0)
Monocytes Relative: 9.3 % (ref 3.0–12.0)
Neutro Abs: 4.8 10*3/uL (ref 1.4–7.7)
Neutrophils Relative %: 60.9 % (ref 43.0–77.0)
Platelets: 279 10*3/uL (ref 150.0–400.0)
RBC: 4.2 Mil/uL (ref 3.87–5.11)
RDW: 15.7 % — ABNORMAL HIGH (ref 11.5–15.5)
WBC: 7.9 10*3/uL (ref 4.0–10.5)

## 2016-05-22 LAB — POC URINALSYSI DIPSTICK (AUTOMATED)
Bilirubin, UA: NEGATIVE
Blood, UA: NEGATIVE
Glucose, UA: NEGATIVE
Ketones, UA: NEGATIVE
Leukocytes, UA: NEGATIVE
Nitrite, UA: NEGATIVE
Protein, UA: NEGATIVE
Spec Grav, UA: 1.02
Urobilinogen, UA: 0.2
pH, UA: 7

## 2016-05-22 LAB — BRAIN NATRIURETIC PEPTIDE: Pro B Natriuretic peptide (BNP): 48 pg/mL (ref 0.0–100.0)

## 2016-05-22 NOTE — Addendum Note (Signed)
Addended by: Josph Macho A on: 05/22/2016 10:40 AM   Modules accepted: Orders

## 2016-05-22 NOTE — Patient Instructions (Addendum)
Labs today. Urinalysis today as well. We will [facility]ray [facility]).  We will be in touch with results and plan. For now, continue dulera 2 puffs twice daily as well as albuterol inhaler 2 puffs scheduled three times a day for next 3 days.

## 2016-05-22 NOTE — Progress Notes (Signed)
Pre visit review using our clinic review tool, if applicable. No additional management support is needed unless otherwise documented below in the visit note. 

## 2016-05-22 NOTE — Progress Notes (Signed)
BP 130/80   Pulse 82   Temp 97.3 F (36.3 C) (Oral)   Wt (!) 375 lb 8 oz (170.3 kg)   SpO2 95%   BMI 64.45 kg/m    CC: dyspnea, wheezing Subjective:    Patient ID: Amy Barnes, female    DOB: 09/15/1951, 65 y.o.   MRN: [id number]  HPI: Amy Barnes is a 65 y.o. female presenting on 05/22/2016 for Wheezing (DOE; also want labs)   2 wk h/o worsening dyspnea on exertion and chest pain described as sharp, improved with rubbing chest. Productive cough present. Eyes itchy and hoarseness present. Notes worsening pedal edema.   Denies nausea, palpitations, dizziness or headaches. No sore throat, PNDrainage.   Lives with daughter and grand daughter. They were both sick 1 month ago.  Pt takes doxycycline 100mg  once daily for prosthesis infection prevention.  Recently started on doxy 100mg  BID by Dr Metta Clines (05/14/2016) after nerve ablation for knees.   Known allergic asthma. Triggers are seasonal allergies and bleach. Also with OSA off CPAP. Confusion on how to take meds - states she's been taking left over dulera from prior hospitalization.   Oh by the way.. Worried about UTI - increased urgency, foul odor without dysuria.   Relevant past medical, surgical, family and social history reviewed and updated as indicated. Interim medical history since our last visit reviewed. Allergies and medications reviewed and updated. Current Outpatient Prescriptions on File Prior to Visit  Medication Sig  . albuterol (PROVENTIL HFA;VENTOLIN HFA) 108 (90 BASE) MCG/ACT inhaler Inhale 2 puffs into the lungs every 6 (six) hours as needed for wheezing or shortness of breath. Reported on 04/17/2016  . aspirin 81 MG EC tablet Take 81 mg by mouth at bedtime.   . Calcium Carbonate-Vitamin D (CALCIUM 600+D) 600-400 MG-UNIT tablet Take 1 tablet by mouth daily.  . celecoxib (CELEBREX) 200 MG capsule Take 1 capsule (200 mg total) by mouth 2 (two) times daily.  . clotrimazole (LOTRIMIN) 1 % cream Apply topically 2  (two) times daily. In groin  . doxycycline (VIBRA-TABS) 100 MG tablet Take 100 mg by mouth at bedtime. Reported on 12/31/2015  . doxycycline (VIBRA-TABS) 100 MG tablet Take 1 tablet (100 mg total) by mouth 2 (two) times daily.  . fluconazole (DIFLUCAN) 150 MG tablet Take 1 tablet (150 mg total) by mouth once.  . Fluticasone-Salmeterol (ADVAIR) 250-50 MCG/DOSE AEPB Inhale 1 puff into the lungs 2 (two) times daily.  . furosemide (LASIX) 40 MG tablet Take 1 tablet (40 mg total) by mouth 2 (two) times daily.  Marland Kitchen glucosamine-chondroitin 500-400 MG tablet Take 1 tablet by mouth 2 (two) times daily. Reported on 04/17/2016  . HYDROmorphone (DILAUDID) 2 MG tablet Take 2 mg by mouth every 6 (six) hours as needed for severe pain.  Marland Kitchen lamoTRIgine (LAMICTAL) 200 MG tablet Take 200 mg by mouth daily.    Marland Kitchen levothyroxine (SYNTHROID, LEVOTHROID) 150 MCG tablet Take 150 mcg by mouth daily before breakfast.   . Liraglutide (VICTOZA) 18 MG/3ML SOPN Inject 1.8 mg into the skin daily.  Marland Kitchen losartan (COZAAR) 50 MG tablet Take 1 tablet (50 mg total) by mouth daily.  . metFORMIN (GLUCOPHAGE-XR) 500 MG 24 hr tablet Take 2 tablets (1,000 mg total) by mouth daily with breakfast.  . montelukast (SINGULAIR) 10 MG tablet Take 1 tablet (10 mg total) by mouth at bedtime.  . Multiple Vitamin (MULTIVITAMIN WITH MINERALS) TABS tablet Take 1 tablet by mouth daily.  . ondansetron (ZOFRAN) 8 MG tablet  Take 8 mg by mouth every 8 (eight) hours as needed for nausea or vomiting.   . Oxcarbazepine (TRILEPTAL) 300 MG tablet Take 300-600 mg by mouth 2 (two) times daily. Pt takes one tablet in the morning and two in the evening.  Marland Kitchen oxyCODONE-acetaminophen (PERCOCET/ROXICET) 5-325 MG tablet Take 1 tablet by mouth every 6 (six) hours as needed for moderate pain.  . pantoprazole (PROTONIX) 40 MG tablet Take 1 tablet (40 mg total) by mouth daily.  . polyethylene glycol (MIRALAX / GLYCOLAX) packet Take 17 g by mouth daily as needed for mild constipation.    . promethazine (PHENERGAN) 25 MG tablet Take 25 mg by mouth every 6 (six) hours as needed for nausea or vomiting.  . simvastatin (ZOCOR) 40 MG tablet Take 1 tablet (40 mg total) by mouth every evening.  . zaleplon (SONATA) 10 MG capsule Take 10-20 mg by mouth at bedtime as needed for sleep. Reported on 04/17/2016  . Linaclotide (LINZESS) 145 MCG CAPS capsule Take 1 capsule (145 mcg total) by mouth daily. (Patient not taking: Reported on 05/22/2016)   Current Facility-Administered Medications on File Prior to Visit  Medication  . lactated ringers infusion 1,000 mL    Review of Systems Per HPI unless specifically indicated in ROS section     Objective:    BP 130/80   Pulse 82   Temp 97.3 F (36.3 C) (Oral)   Wt (!) 375 lb 8 oz (170.3 kg)   SpO2 95%   BMI 64.45 kg/m   Wt Readings from Last 3 Encounters:  05/22/16 (!) 375 lb 8 oz (170.3 kg)  05/14/16 (!) 360 lb (163.3 kg)  04/17/16 (!) 370 lb (167.8 kg)    Physical Exam  Constitutional: She appears well-developed and well-nourished. No distress.  Morbidly obese  HENT:  Head: Normocephalic and atraumatic.  Right Ear: Hearing, tympanic membrane, external ear and ear canal normal.  Left Ear: Hearing, tympanic membrane, external ear and ear canal normal.  Nose: Mucosal edema present. No rhinorrhea. Right sinus exhibits no maxillary sinus tenderness and no frontal sinus tenderness. Left sinus exhibits no maxillary sinus tenderness and no frontal sinus tenderness.  Mouth/Throat: Uvula is midline, oropharynx is clear and moist and mucous membranes are normal. No oropharyngeal exudate, posterior oropharyngeal edema, posterior oropharyngeal erythema or tonsillar abscesses.  Nasal mucosal erythema  Eyes: Conjunctivae and EOM are normal. Pupils are equal, round, and reactive to light. No scleral icterus.  Neck: Normal range of motion. Neck supple.  Cardiovascular: Normal rate, regular rhythm, normal heart sounds and intact distal pulses.     No murmur heard. Pulmonary/Chest: Effort normal and breath sounds normal. No respiratory distress. She has no wheezes. She has no rales.  Lungs seem clear  Lymphadenopathy:    She has no cervical adenopathy.  Skin: Skin is warm and dry. No rash noted.  Nursing note and vitals reviewed.      Assessment & Plan:   Problem List Items Addressed This Visit    Asthma    Discussed controlled vs rescue medication use.      Relevant Medications   mometasone-formoterol (DULERA) 100-5 MCG/ACT AERO   DOE (dyspnea on exertion) - Primary    New, worsening over last 2 wks. ?mild CHF exacerbation vs asthma flare vs bronchitis. Lungs seem clear today. Further eval with CXR and labs (BNP, CBC). In interim, discussed regular dulera and scheduled albuterol inhaler. Will be in touch with results.       Relevant Orders   CBC  with Differential/Platelet   Brain natriuretic peptide   DG Chest 2 View   Morbid obesity with BMI of 60.0-69.9, adult (HCC)   Urinary urgency    Check UA today.        Other Visit Diagnoses   None.      Follow up plan: Return if symptoms worsen or fail to improve.  [name], MD

## 2016-05-22 NOTE — Assessment & Plan Note (Signed)
Discussed controlled vs rescue medication use.

## 2016-05-22 NOTE — Assessment & Plan Note (Signed)
Check UA today.  

## 2016-05-22 NOTE — Assessment & Plan Note (Signed)
New, worsening over last 2 wks. ?mild CHF exacerbation vs asthma flare vs bronchitis. Lungs seem clear today. Further eval with CXR and labs (BNP, CBC). In interim, discussed regular dulera and scheduled albuterol inhaler. Will be in touch with results.

## 2016-06-03 ENCOUNTER — Other Ambulatory Visit: Payer: Self-pay | Admitting: Family Medicine

## 2016-06-09 ENCOUNTER — Encounter: Payer: Self-pay | Admitting: Pain Medicine

## 2016-06-09 ENCOUNTER — Ambulatory Visit: Payer: Medicare Other | Attending: Pain Medicine | Admitting: Pain Medicine

## 2016-06-09 VITALS — BP 114/94 | HR 82 | Temp 97.6°F | Resp 16 | Ht 64.0 in | Wt 375.0 lb

## 2016-06-09 DIAGNOSIS — Z96642 Presence of left artificial hip joint: Secondary | ICD-10-CM | POA: Insufficient documentation

## 2016-06-09 DIAGNOSIS — M170 Bilateral primary osteoarthritis of knee: Secondary | ICD-10-CM | POA: Diagnosis not present

## 2016-06-09 DIAGNOSIS — M5136 Other intervertebral disc degeneration, lumbar region: Secondary | ICD-10-CM | POA: Insufficient documentation

## 2016-06-09 DIAGNOSIS — M5030 Other cervical disc degeneration, unspecified cervical region: Secondary | ICD-10-CM | POA: Insufficient documentation

## 2016-06-09 DIAGNOSIS — M542 Cervicalgia: Secondary | ICD-10-CM | POA: Diagnosis present

## 2016-06-09 DIAGNOSIS — G90523 Complex regional pain syndrome I of lower limb, bilateral: Secondary | ICD-10-CM

## 2016-06-09 DIAGNOSIS — M5416 Radiculopathy, lumbar region: Secondary | ICD-10-CM

## 2016-06-09 DIAGNOSIS — Q761 Klippel-Feil syndrome: Secondary | ICD-10-CM

## 2016-06-09 DIAGNOSIS — Z981 Arthrodesis status: Secondary | ICD-10-CM | POA: Insufficient documentation

## 2016-06-09 DIAGNOSIS — G8929 Other chronic pain: Secondary | ICD-10-CM | POA: Insufficient documentation

## 2016-06-09 DIAGNOSIS — M47816 Spondylosis without myelopathy or radiculopathy, lumbar region: Secondary | ICD-10-CM

## 2016-06-09 DIAGNOSIS — M160 Bilateral primary osteoarthritis of hip: Secondary | ICD-10-CM

## 2016-06-09 NOTE — Progress Notes (Signed)
Patient here for evaluation post procedure.    Safety precautions to be maintained throughout the outpatient stay will include: orient to surroundings, keep bed in low position, maintain call bell within reach at all times, provide assistance with transfer out of bed and ambulation.

## 2016-06-09 NOTE — Patient Instructions (Addendum)
cc

## 2016-06-11 NOTE — Progress Notes (Signed)
    The patient is a 65 year old female with pain involving the neck entire back upper and lower extremity regions. The patient is with morbid obesity with significant pain involving the knees. The patient is status post genicular nerve blocks of the knees with some improvement of pain involving the knees. At the present time we will continue present treatment and will consider patient for interventional treatment consisting of lumbar facet injections and other treatment pending follow-up evaluation. The patient is with understanding and agreement with suggested treatment is prefers to avoid obtaining medications for treatment of her pain at this time.    Physical examination  There was tenderness of the splenius capitis and occipitalis region a mild to moderate degree with mild to moderate tenderness of the acromioclavicular and glenohumeral joint regions. The patient was with moderate difficulty performing the drop test. The patient appeared to be with unremarkable Spurling's maneuver. Palpation over the thoracic region was attends to palpation of moderate degree without crepitus of the thoracic region. Palpation over the lumbar region was with moderate tenderness to palpation with lateral bending rotation extension and palpation of the lumbar facets reproducing moderate discomfort. Straight leg raising was tolerated to 20 without increased pain with dorsiflexion noted. The knees were with moderate tenderness to palpation with negative anterior and posterior drawer signs without ballottement of the patella and without increased warmth erythema of the knees. There was moderate tenderness of the knees on the left and right.. There was no sensory deficit of dermatomal distribution detected. There was negative clonus negative Homans. Abdomen protuberant without excessive tends to palpation and no costovertebral tenderness noted.     Assessment   Degenerative disc disease lumbar spine  Status post  lumbar fusion  Lumbar facet syndrome  Degenerative disc disease cervical spine  Status post cervical fusion  Cervical facet syndrome  Status post total hip replacement on the left  Degenerative joint disease of knees    PLAN  Continue present medications  We will consider lumbar facet, medial branch nerve blocks as discussed  F/U PCP Dr.Gutierrez  for evaluation of  BP and general medical  condition  F/U surgical evaluation. Follow-up with Dr. Lovell Sheehan, neurosurgeon, and [facility] orthopedic surgeon, As discussed,    F/U neurological evaluation. May consider pending follow-up evaluations  May consider radiofrequency rhizolysis or intraspinal procedures pending response to present treatment and F/U evaluation   Patient to call Pain Management Center should patient have concerns prior to scheduled return appointment.

## 2016-06-28 ENCOUNTER — Other Ambulatory Visit: Payer: Self-pay | Admitting: Family Medicine

## 2016-06-28 DIAGNOSIS — Z1159 Encounter for screening for other viral diseases: Secondary | ICD-10-CM

## 2016-06-28 DIAGNOSIS — E7800 Pure hypercholesterolemia, unspecified: Secondary | ICD-10-CM

## 2016-06-28 DIAGNOSIS — E1142 Type 2 diabetes mellitus with diabetic polyneuropathy: Secondary | ICD-10-CM

## 2016-06-28 DIAGNOSIS — E039 Hypothyroidism, unspecified: Secondary | ICD-10-CM

## 2016-06-28 DIAGNOSIS — I10 Essential (primary) hypertension: Secondary | ICD-10-CM

## 2016-06-30 ENCOUNTER — Ambulatory Visit (INDEPENDENT_AMBULATORY_CARE_PROVIDER_SITE_OTHER): Payer: Medicare Other | Admitting: Family Medicine

## 2016-06-30 ENCOUNTER — Encounter: Payer: Self-pay | Admitting: Family Medicine

## 2016-06-30 VITALS — BP 118/78 | HR 68 | Temp 97.8°F | Wt 382.0 lb

## 2016-06-30 DIAGNOSIS — R21 Rash and other nonspecific skin eruption: Secondary | ICD-10-CM | POA: Diagnosis not present

## 2016-06-30 DIAGNOSIS — B029 Zoster without complications: Secondary | ICD-10-CM

## 2016-06-30 HISTORY — DX: Zoster without complications: B02.9

## 2016-06-30 MED ORDER — ACYCLOVIR 400 MG PO TABS: 800.0000 mg | ORAL_TABLET | Freq: Every day | ORAL | 0 refills | Status: DC

## 2016-06-30 MED ORDER — FLUCONAZOLE 150 MG PO TABS: 150.0000 mg | ORAL_TABLET | Freq: Once | ORAL | 0 refills | Status: AC

## 2016-06-30 MED ORDER — GABAPENTIN 100 MG PO CAPS: 100.0000 mg | ORAL_CAPSULE | Freq: Two times a day (BID) | ORAL | 0 refills | Status: DC

## 2016-06-30 MED ORDER — AMOXICILLIN-POT CLAVULANATE 875-125 MG PO TABS: 1.0000 | ORAL_TABLET | Freq: Two times a day (BID) | ORAL | 0 refills | Status: DC

## 2016-06-30 NOTE — Patient Instructions (Addendum)
Suspicious for shingles or cellulitis.  Treat with augmentin antibiotic and acyclovir antiviral. May use gabapentin as needed. Let us know if any questions.

## 2016-06-30 NOTE — Progress Notes (Signed)
Pre visit review using our clinic review tool, if applicable. No additional management support is needed unless otherwise documented below in the visit note. 

## 2016-06-30 NOTE — Assessment & Plan Note (Addendum)
Exam most consistent with cellulitis of L breast. No true vesicles at this time (?early vesicles). Given exposure (husband with shingles), will cover for both. Treat with acyclovir 800mg  5x/day x 7 days as well as augmentin 7d course (given other abx allergies).  Gabapentin for discomfort PRN Update if not improving with treatment.

## 2016-06-30 NOTE — Progress Notes (Signed)
BP 118/78   Pulse 68   Temp 97.8 F (36.6 C) (Oral)   Wt (!) 382 lb (173.3 kg)   BMI 65.57 kg/m    CC: ?shingles Subjective:    Patient ID: Amy Barnes, female    DOB: 11-28-1950, 65 y.o.   MRN: [id number]  HPI: Amy Barnes is a 65 y.o. female presenting on 06/30/2016 for Herpes Zoster (left breast and left hip)   2d h/o rash on left lateral breast and L lateral breast. Rash has spread.  Itchy and tender.   No fevers/chills, nausea. Tried benadryl spray which didn't help.  Husband with shingles dx 06/03/2016.   Relevant past medical, surgical, family and social history reviewed and updated as indicated. Interim medical history since our last visit reviewed. Allergies and medications reviewed and updated. Current Outpatient Prescriptions on File Prior to Visit  Medication Sig  . albuterol (PROVENTIL HFA;VENTOLIN HFA) 108 (90 BASE) MCG/ACT inhaler Inhale 2 puffs into the lungs every 6 (six) hours as needed for wheezing or shortness of breath. Reported on 04/17/2016  . aspirin 81 MG EC tablet Take 81 mg by mouth at bedtime.   . Calcium Carbonate-Vitamin D (CALCIUM 600+D) 600-400 MG-UNIT tablet Take 1 tablet by mouth daily.  . celecoxib (CELEBREX) 200 MG capsule Take 1 capsule (200 mg total) by mouth 2 (two) times daily.  . clotrimazole (LOTRIMIN) 1 % cream Apply topically 2 (two) times daily. In groin  . doxycycline (VIBRA-TABS) 100 MG tablet Take 100 mg by mouth at bedtime. Reported on 12/31/2015  . Fluticasone-Salmeterol (ADVAIR) 250-50 MCG/DOSE AEPB Inhale 1 puff into the lungs 2 (two) times daily.  . furosemide (LASIX) 40 MG tablet TAKE 1 TABLET (40 MG TOTAL) BY MOUTH 2 (TWO) TIMES DAILY.  Marland Kitchen glucosamine-chondroitin 500-400 MG tablet Take 1 tablet by mouth 2 (two) times daily. Reported on 04/17/2016  . HYDROmorphone (DILAUDID) 2 MG tablet Take 2 mg by mouth every 6 (six) hours as needed for severe pain.  Marland Kitchen lamoTRIgine (LAMICTAL) 200 MG tablet Take 200 mg by mouth daily.    Marland Kitchen  levothyroxine (SYNTHROID, LEVOTHROID) 150 MCG tablet Take 150 mcg by mouth daily before breakfast.   . Linaclotide (LINZESS) 145 MCG CAPS capsule Take 1 capsule (145 mcg total) by mouth daily.  . Liraglutide (VICTOZA) 18 MG/3ML SOPN Inject 1.8 mg into the skin daily.  Marland Kitchen losartan (COZAAR) 50 MG tablet Take 1 tablet (50 mg total) by mouth daily.  . metFORMIN (GLUCOPHAGE-XR) 500 MG 24 hr tablet Take 2 tablets (1,000 mg total) by mouth daily with breakfast.  . mometasone-formoterol (DULERA) 100-5 MCG/ACT AERO Inhale 2 puffs into the lungs 2 (two) times daily.  . montelukast (SINGULAIR) 10 MG tablet Take 1 tablet (10 mg total) by mouth at bedtime.  . Multiple Vitamin (MULTIVITAMIN WITH MINERALS) TABS tablet Take 1 tablet by mouth daily.  . ondansetron (ZOFRAN) 8 MG tablet Take 8 mg by mouth every 8 (eight) hours as needed for nausea or vomiting.   . Oxcarbazepine (TRILEPTAL) 300 MG tablet Take 300-600 mg by mouth 2 (two) times daily. Pt takes one tablet in the morning and two in the evening.  Marland Kitchen oxyCODONE-acetaminophen (PERCOCET/ROXICET) 5-325 MG tablet Take 1 tablet by mouth every 6 (six) hours as needed for moderate pain.  . pantoprazole (PROTONIX) 40 MG tablet Take 1 tablet (40 mg total) by mouth daily.  . polyethylene glycol (MIRALAX / GLYCOLAX) packet Take 17 g by mouth daily as needed for mild constipation.   Marland Kitchen  promethazine (PHENERGAN) 25 MG tablet Take 25 mg by mouth every 6 (six) hours as needed for nausea or vomiting.  . simvastatin (ZOCOR) 40 MG tablet Take 1 tablet (40 mg total) by mouth every evening.  . zaleplon (SONATA) 10 MG capsule Take 10-20 mg by mouth at bedtime as needed for sleep. Reported on 04/17/2016   Current Facility-Administered Medications on File Prior to Visit  Medication  . lactated ringers infusion 1,000 mL    Review of Systems Per HPI unless specifically indicated in ROS section     Objective:    BP 118/78   Pulse 68   Temp 97.8 F (36.6 C) (Oral)   Wt (!)  382 lb (173.3 kg)   BMI 65.57 kg/m   Wt Readings from Last 3 Encounters:  06/30/16 (!) 382 lb (173.3 kg)  06/09/16 (!) 375 lb (170.1 kg)  05/22/16 (!) 375 lb 8 oz (170.3 kg)    Physical Exam  Constitutional: She appears well-developed and well-nourished. No distress.  Skin: Skin is warm and dry. Rash noted. There is erythema.  Patch of warm erythematous rash on left lateral breast that is tender to palpation. Small nodule L breast at 3 o clock position.  Small erythematous rash with few papules or early vesicles on left lateral hip.   Nursing note and vitals reviewed.     Assessment & Plan:   Problem List Items Addressed This Visit    Skin rash - Primary    Exam most consistent with cellulitis of L breast. No true vesicles at this time (?early vesicles). Given exposure (husband with shingles), will cover for both. Treat with acyclovir 800mg  5x/day x 7 days as well as augmentin 7d course (given other abx allergies).  Gabapentin for discomfort PRN Update if not improving with treatment.        Other Visit Diagnoses   None.      Follow up plan: No Follow-up on file.  Amy Boyden, MD

## 2016-07-01 ENCOUNTER — Other Ambulatory Visit (INDEPENDENT_AMBULATORY_CARE_PROVIDER_SITE_OTHER): Payer: Medicare Other

## 2016-07-01 DIAGNOSIS — E039 Hypothyroidism, unspecified: Secondary | ICD-10-CM

## 2016-07-01 DIAGNOSIS — E7800 Pure hypercholesterolemia, unspecified: Secondary | ICD-10-CM | POA: Diagnosis not present

## 2016-07-01 DIAGNOSIS — Z1159 Encounter for screening for other viral diseases: Secondary | ICD-10-CM

## 2016-07-01 DIAGNOSIS — E1142 Type 2 diabetes mellitus with diabetic polyneuropathy: Secondary | ICD-10-CM | POA: Diagnosis not present

## 2016-07-01 DIAGNOSIS — I10 Essential (primary) hypertension: Secondary | ICD-10-CM

## 2016-07-01 LAB — COMPREHENSIVE METABOLIC PANEL
ALT: 25 U/L (ref 0–35)
AST: 18 U/L (ref 0–37)
Albumin: 3.8 g/dL (ref 3.5–5.2)
Alkaline Phosphatase: 77 U/L (ref 39–117)
BUN: 17 mg/dL (ref 6–23)
CO2: 33 mEq/L — ABNORMAL HIGH (ref 19–32)
Calcium: 8.9 mg/dL (ref 8.4–10.5)
Chloride: 98 mEq/L (ref 96–112)
Creatinine, Ser: 0.76 mg/dL (ref 0.40–1.20)
GFR: 81.42 mL/min (ref >60.00)
Glucose, Bld: 183 mg/dL — ABNORMAL HIGH (ref 70–99)
Potassium: 4.7 mEq/L (ref 3.5–5.1)
Sodium: 138 mEq/L (ref 135–145)
Total Bilirubin: 0.4 mg/dL (ref 0.2–1.2)
Total Protein: 6.7 g/dL (ref 6.0–8.3)

## 2016-07-01 LAB — LIPID PANEL
Cholesterol: 139 mg/dL (ref 0–200)
HDL: 59 mg/dL (ref >39.00)
LDL Cholesterol: 63 mg/dL (ref 0–99)
NonHDL: 80.48
Total CHOL/HDL Ratio: 2
Triglycerides: 87 mg/dL (ref 0.0–149.0)
VLDL: 17.4 mg/dL (ref 0.0–40.0)

## 2016-07-01 LAB — HEMOGLOBIN A1C: Hgb A1c MFr Bld: 7.1 % — ABNORMAL HIGH (ref 4.6–6.5)

## 2016-07-01 LAB — TSH: TSH: 1.4 u[IU]/mL (ref 0.35–4.50)

## 2016-07-02 LAB — HEPATITIS C ANTIBODY: HCV Ab: NEGATIVE

## 2016-07-08 ENCOUNTER — Ambulatory Visit (INDEPENDENT_AMBULATORY_CARE_PROVIDER_SITE_OTHER): Payer: Medicare Other | Admitting: Family Medicine

## 2016-07-08 ENCOUNTER — Encounter: Payer: Self-pay | Admitting: Family Medicine

## 2016-07-08 VITALS — BP 130/86 | HR 92 | Temp 98.5°F | Ht 63.75 in | Wt 384.2 lb

## 2016-07-08 DIAGNOSIS — B029 Zoster without complications: Secondary | ICD-10-CM

## 2016-07-08 DIAGNOSIS — R9431 Abnormal electrocardiogram [ECG] [EKG]: Secondary | ICD-10-CM | POA: Insufficient documentation

## 2016-07-08 DIAGNOSIS — E039 Hypothyroidism, unspecified: Secondary | ICD-10-CM

## 2016-07-08 DIAGNOSIS — I700 Atherosclerosis of aorta: Secondary | ICD-10-CM

## 2016-07-08 DIAGNOSIS — F3170 Bipolar disorder, currently in remission, most recent episode unspecified: Secondary | ICD-10-CM

## 2016-07-08 DIAGNOSIS — E7800 Pure hypercholesterolemia, unspecified: Secondary | ICD-10-CM | POA: Diagnosis not present

## 2016-07-08 DIAGNOSIS — Z1239 Encounter for other screening for malignant neoplasm of breast: Secondary | ICD-10-CM

## 2016-07-08 DIAGNOSIS — G894 Chronic pain syndrome: Secondary | ICD-10-CM

## 2016-07-08 DIAGNOSIS — Z0000 Encounter for general adult medical examination without abnormal findings: Secondary | ICD-10-CM

## 2016-07-08 DIAGNOSIS — Z87891 Personal history of nicotine dependence: Secondary | ICD-10-CM | POA: Insufficient documentation

## 2016-07-08 DIAGNOSIS — E1142 Type 2 diabetes mellitus with diabetic polyneuropathy: Secondary | ICD-10-CM

## 2016-07-08 DIAGNOSIS — Z1231 Encounter for screening mammogram for malignant neoplasm of breast: Secondary | ICD-10-CM

## 2016-07-08 DIAGNOSIS — Z23 Encounter for immunization: Secondary | ICD-10-CM

## 2016-07-08 DIAGNOSIS — I10 Essential (primary) hypertension: Secondary | ICD-10-CM | POA: Diagnosis not present

## 2016-07-08 DIAGNOSIS — Z6841 Body Mass Index (BMI) 40.0 and over, adult: Secondary | ICD-10-CM

## 2016-07-08 MED ORDER — FLUCONAZOLE 150 MG PO TABS: 150.0000 mg | ORAL_TABLET | Freq: Once | ORAL | 0 refills | Status: DC

## 2016-07-08 NOTE — Progress Notes (Signed)
Pre visit review using our clinic review tool, if applicable. No additional management support is needed unless otherwise documented below in the visit note. 

## 2016-07-08 NOTE — Assessment & Plan Note (Addendum)
Refer for lung cancer screening CT

## 2016-07-08 NOTE — Assessment & Plan Note (Signed)
Chronc, stable. Continue current regimen.  

## 2016-07-08 NOTE — Assessment & Plan Note (Signed)
Continue aspirin, statin.  

## 2016-07-08 NOTE — Assessment & Plan Note (Addendum)
Chronic, stable. Followed by psychiatry. Continue lamictal and trileptal.

## 2016-07-08 NOTE — Assessment & Plan Note (Signed)
Continue to encourage healthy diet for goal weight loss.

## 2016-07-08 NOTE — Progress Notes (Signed)
BP 130/86   Pulse 92   Temp 98.5 F (36.9 C) (Oral)   Ht 5' 3.75" (1.619 m)   Wt (!) 384 lb 4 oz (174.3 kg)   BMI 66.47 kg/m    CC: welcome to medicare visit Subjective:    Patient ID: Amy Barnes, female    DOB: 05/17/1951, 65 y.o.   MRN: [id number]  HPI: Amy Barnes is a 65 y.o. female presenting on 07/08/2016 for Annual Exam   Received medicare 05/2016.  Established with Dr Metta Clines. Dr Metta Clines has now left his practice.   Recent shingles last week - continues improving.   Hearing screen - passed Vision screen - at eye center Fall risk screen - 1 fall in spring while gardening [place]  Depression screen - passed  Preventative: COLONOSCOPY WITH PROPOFOL 12/31/2015; diverticulosis, int hem, o/w normal rpt 10 yrs ([facility]) Lung cancer screening - ex smoker, quit 2010. ~40 PY hx. Breast cancer screening - overdue, agrees to reschedule [facility]. Doesn't do breast exams at home.  Well woman exam - h/o partial hysterectomy for menorrhagia, ovaries remain.  DEXA scan - not done previously. Flu shot - today Td - 2008 Pneumovax 2013 Shingles shot - 2015 Advanced directive discussion - has for [place]. Wants husband then son to be HCPOA.  Seat belt use discussed Sunscreen use discussed. 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 Interim medical history since our last visit reviewed. Allergies and medications reviewed and updated. Current Outpatient Prescriptions on File Prior to Visit  Medication Sig  . albuterol (PROVENTIL HFA;VENTOLIN HFA) 108 (90 BASE) MCG/ACT inhaler Inhale 2 puffs into the lungs every 6 (six) hours as needed for wheezing or shortness of breath. Reported on  04/17/2016  . aspirin 81 MG EC tablet Take 81 mg by mouth at bedtime.   . Calcium Carbonate-Vitamin D (CALCIUM 600+D) 600-400 MG-UNIT tablet Take 1 tablet by mouth daily.  . celecoxib (CELEBREX) 200 MG capsule Take 1 capsule (200 mg total) by mouth 2 (two) times daily.  . clotrimazole (LOTRIMIN) 1 % cream Apply topically 2 (two) times daily. In groin  . doxycycline (VIBRA-TABS) 100 MG tablet Take 100 mg by mouth at bedtime. Reported on 12/31/2015  . Fluticasone-Salmeterol (ADVAIR) 250-50 MCG/DOSE AEPB Inhale 1 puff into the lungs 2 (two) times daily.  . furosemide (LASIX) 40 MG tablet TAKE 1 TABLET (40 MG TOTAL) BY MOUTH 2 (TWO) TIMES DAILY.  Marland Kitchen glucosamine-chondroitin 500-400 MG tablet Take 1 tablet by mouth 2 (two) times daily. Reported on 04/17/2016  . HYDROmorphone (DILAUDID) 2 MG tablet Take 2 mg by mouth every 6 (six) hours as needed for severe pain.  Marland Kitchen lamoTRIgine (LAMICTAL) 200 MG tablet Take 200 mg by mouth daily.    Marland Kitchen levothyroxine (SYNTHROID, LEVOTHROID) 150 MCG tablet Take 150 mcg by mouth daily before breakfast.   . Linaclotide (LINZESS) 145 MCG CAPS capsule Take 1 capsule (145 mcg total) by mouth daily.  . Liraglutide (VICTOZA) 18 MG/3ML SOPN Inject 1.8 mg into the skin daily.  Marland Kitchen losartan (COZAAR) 50 MG tablet Take 1 tablet (50 mg total) by mouth daily.  . metFORMIN (GLUCOPHAGE-XR) 500 MG 24 hr tablet Take 2 tablets (1,000 mg total) by mouth daily with  breakfast.  . mometasone-formoterol (DULERA) 100-5 MCG/ACT AERO Inhale 2 puffs into the lungs 2 (two) times daily.  . montelukast (SINGULAIR) 10 MG tablet Take 1 tablet (10 mg total) by mouth at bedtime.  . Multiple Vitamin (MULTIVITAMIN WITH MINERALS) TABS tablet Take 1 tablet by mouth daily.  . ondansetron (ZOFRAN) 8 MG tablet Take 8 mg by mouth every 8 (eight) hours as needed for nausea or vomiting.   . Oxcarbazepine (TRILEPTAL) 300 MG tablet Take 300-600 mg by mouth 2 (two) times daily. Pt takes one tablet in the morning and two in the  evening.  Marland Kitchen oxyCODONE-acetaminophen (PERCOCET/ROXICET) 5-325 MG tablet Take 1 tablet by mouth every 6 (six) hours as needed for moderate pain.  . pantoprazole (PROTONIX) 40 MG tablet Take 1 tablet (40 mg total) by mouth daily.  . polyethylene glycol (MIRALAX / GLYCOLAX) packet Take 17 g by mouth daily as needed for mild constipation.   . promethazine (PHENERGAN) 25 MG tablet Take 25 mg by mouth every 6 (six) hours as needed for nausea or vomiting.  . simvastatin (ZOCOR) 40 MG tablet Take 1 tablet (40 mg total) by mouth every evening.  . zaleplon (SONATA) 10 MG capsule Take 10-20 mg by mouth at bedtime as needed for sleep. Reported on 04/17/2016   Current Facility-Administered Medications on File Prior to Visit  Medication  . lactated ringers infusion 1,000 mL    Review of Systems Per HPI unless specifically indicated in ROS section     Objective:    BP 130/86   Pulse 92   Temp 98.5 F (36.9 C) (Oral)   Ht 5' 3.75" (1.619 m)   Wt (!) 384 lb 4 oz (174.3 kg)   BMI 66.47 kg/m   Wt Readings from Last 3 Encounters:  07/08/16 (!) 384 lb 4 oz (174.3 kg)  06/30/16 (!) 382 lb (173.3 kg)  06/09/16 (!) 375 lb (170.1 kg)    Physical Exam  Constitutional: She is oriented to person, place, and time. She appears well-developed and well-nourished. No distress.  Walks with cane  HENT:  Head: Normocephalic and atraumatic.  Right Ear: Hearing, tympanic membrane, external ear and ear canal normal.  Left Ear: Hearing, tympanic membrane, external ear and ear canal normal.  Nose: Nose normal.  Mouth/Throat: Uvula is midline, oropharynx is clear and moist and mucous membranes are normal. No oropharyngeal exudate, posterior oropharyngeal edema or posterior oropharyngeal erythema.  Eyes: Conjunctivae and EOM are normal. Pupils are equal, round, and reactive to light. No scleral icterus.  Neck: Normal range of motion. Neck supple. No thyromegaly present.  Cardiovascular: Normal rate, regular rhythm,  normal heart sounds and intact distal pulses.   No murmur heard. Pulses:      Radial pulses are 2+ on the right side, and 2+ on the left side.  Pulmonary/Chest: Effort normal and breath sounds normal. No respiratory distress. She has no wheezes. She has no rales. Right breast exhibits no inverted nipple, no mass, no nipple discharge, no skin change and no tenderness. Left breast exhibits no inverted nipple, no mass, no nipple discharge, no skin change and no tenderness. Breasts are symmetrical.  Abdominal: Soft. Bowel sounds are normal. She exhibits no distension and no mass. There is no tenderness. There is no rebound and no guarding.  Musculoskeletal: Normal range of motion. She exhibits no edema.  Lymphadenopathy:       Head (right side): No submental, no submandibular, no tonsillar, no preauricular and no posterior auricular adenopathy present.  Head (left side): No submental, no submandibular, no tonsillar, no preauricular and no posterior auricular adenopathy present.    She has no cervical adenopathy.    She has no axillary adenopathy.       Right axillary: No lateral adenopathy present.       Left axillary: No lateral adenopathy present.      Right: No supraclavicular adenopathy present.       Left: No supraclavicular adenopathy present.  Neurological: She is alert and oriented to person, place, and time.  CN grossly intact, station and gait intact Recall 3/3 Calculation 5/5 serial 3s  Skin: Skin is warm and dry. No rash noted.  Tender to palpation at skin of abdomen along nodules  Psychiatric: She has a normal mood and affect. Her behavior is normal. Judgment and thought content normal.  Nursing note and vitals reviewed.  Results for orders placed or performed in visit on 07/01/16  Lipid panel  Result Value Ref Range   Cholesterol 139 0 - 200 mg/dL   Triglycerides 74.2 0.0 - 149.0 mg/dL   HDL 59.56 >38.75 mg/dL   VLDL 64.3 0.0 - 32.9 mg/dL   LDL Cholesterol 63 0 - 99  mg/dL   Total CHOL/HDL Ratio 2    NonHDL 80.48   Comprehensive metabolic panel  Result Value Ref Range   Sodium 138 135 - 145 mEq/L   Potassium 4.7 3.5 - 5.1 mEq/L   Chloride 98 96 - 112 mEq/L   CO2 33 (H) 19 - 32 mEq/L   Glucose, Bld 183 (H) 70 - 99 mg/dL   BUN 17 6 - 23 mg/dL   Creatinine, Ser 5.18 0.40 - 1.20 mg/dL   Total Bilirubin 0.4 0.2 - 1.2 mg/dL   Alkaline Phosphatase 77 39 - 117 U/L   AST 18 0 - 37 U/L   ALT 25 0 - 35 U/L   Total Protein 6.7 6.0 - 8.3 g/dL   Albumin 3.8 3.5 - 5.2 g/dL   Calcium 8.9 8.4 - 84.1 mg/dL   GFR 66.06 >30.16 mL/min  Hemoglobin A1c  Result Value Ref Range   Hgb A1c MFr Bld 7.1 (H) 4.6 - 6.5 %  TSH  Result Value Ref Range   TSH 1.40 0.35 - 4.50 uIU/mL  Hepatitis C antibody  Result Value Ref Range   HCV Ab NEGATIVE NEGATIVE   EKG - prolonged QRS with incomplete block, LAD with LAFB, LAE with p mitrale, LVH, no acute ST/T changes overall unchanged from prior EKG 10/2015    Assessment & Plan:   Problem List Items Addressed This Visit    Abdominal aortic atherosclerosis (HCC)    Continue aspirin, statin.      Bipolar disorder (HCC)    Chronic, stable. Followed by psychiatry. Continue lamictal and trileptal.      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 It seems he still has clinic days in [place] - encouraged continued f/u there.      DM type 2 with diabetic peripheral neuropathy (HCC)    Chronic, stable. Continue current regimen.  Continue victoza 1.8mg  injection daily as well as metformin 1000mg  daily.       Essential hypertension    Chronc, stable. Continue current regimen.      Ex-smoker    Refer for lung cancer screening CT      Relevant Orders   Ambulatory Referral for Lung Cancer Scre   HYPERCHOLESTEROLEMIA    Chronic, stable.  Continue current simvastatin dose.      Hypothyroidism    Stable on current regimen. Continue.      Morbid obesity with BMI of  60.0-69.9, adult (HCC)    Continue to encourage healthy diet for goal weight loss.      Nonspecific abnormal electrocardiogram (ECG) (EKG)    EKG - prolonged QRS with incomplete block, LAD with LAFB, LAE with p mitrale, LVH, no acute ST/T changes overall unchanged from prior EKG 10/2015. Consider echocardiogram for further evaluation. Continue blood pressure control.       Shingles    Presumed shingles improving after treatment with antiviral and antibiotic.      Relevant Medications   fluconazole (DIFLUCAN) 150 MG tablet   Welcome to Medicare preventive visit - Primary    I have personally reviewed the Medicare Annual Wellness questionnaire and have noted 1. The patient's medical and social history 2. Their use of alcohol, tobacco or illicit drugs 3. Their current medications and supplements 4. The patient's functional ability including ADL's, fall risks, home safety risks and hearing or visual impairment. Cognitive function has been assessed and addressed as indicated.  5. Diet and physical activity 6. Evidence for depression or mood disorders The patients weight, height, BMI have been recorded in the chart. I have made referrals, counseling and provided education to the patient based on review of the above and I have provided the pt with a written personalized care plan for preventive services. Provider list updated.. See scanned questionairre as needed for further documentation. Reviewed preventative protocols and updated unless pt declined.       Relevant Orders   EKG 12-Lead (Completed)    Other Visit Diagnoses    Breast cancer screening       Relevant Orders   MM Digital Screening   Need for influenza vaccination       Relevant Orders   Flu Vaccine QUAD 36+ mos PF IM (Fluarix & Fluzone Quad PF) (Completed)       Follow up plan: Return in about 4 months (around 11/08/2016), or as needed, for follow up visit.  [name], MD

## 2016-07-08 NOTE — Assessment & Plan Note (Signed)
Established with Dr Metta Clines who pt reports has left [employer], research (physical sciences). It seems he still has clinic days in [place] - encouraged continued f/u there.

## 2016-07-08 NOTE — Assessment & Plan Note (Signed)

## 2016-07-08 NOTE — Assessment & Plan Note (Addendum)
EKG - prolonged QRS with incomplete block, LAD with LAFB, LAE with p mitrale, LVH, no acute ST/T changes overall unchanged from prior EKG 10/2015. Consider echocardiogram for further evaluation. Continue blood pressure control.

## 2016-07-08 NOTE — Patient Instructions (Addendum)
EKG today for welcome to medicare visit.  Flu shot today We will refer you for lung cancer screening CT and mammogram. We will refill diflucan, take 2nd pill if needed 4 days later. Hold simvastatin when you take diflucan.  Work on [employer].  Good to see you today, call us with questions. Return as needed or in 4 months for follow up visit.   Health Maintenance, Female Adopting a healthy lifestyle and getting preventive care can go a long way to promote health and wellness. Talk with your health care provider about what schedule of regular examinations is right for you. This is a good chance for you to check in with your provider about disease prevention and staying healthy. In between checkups, there are plenty of things you can do on your own. Experts have done a lot of research about which lifestyle changes and preventive measures are most likely to keep you healthy. Ask your health care provider for more information. WEIGHT AND DIET  Eat a healthy diet  Be sure to include plenty of vegetables, fruits, low-fat dairy products, and lean protein.  Do not eat a lot of foods high in solid fats, added sugars, or salt.  Get regular exercise. This is one of the most important things you can do for your health.  Most adults should exercise for at least 150 minutes each week. The exercise should increase your heart rate and make you sweat (moderate-intensity exercise).  Most adults should also do strengthening exercises at least twice a week. This is in addition to the moderate-intensity exercise.  Maintain a healthy weight  Body mass index (BMI) is a measurement that can be used to identify possible weight problems. It estimates body fat based on height and weight. Your health care provider can help determine your BMI and help you achieve or maintain a healthy weight.  For females 59 years of age and older:   A BMI below 18.5 is considered underweight.  A BMI of 18.5 to  24.9 is normal.  A BMI of 25 to 29.9 is considered overweight.  A BMI of 30 and above is considered obese.  Watch levels of cholesterol and blood lipids  You should start having your blood tested for lipids and cholesterol at 64 years of age, then have this test every 5 years.  You may need to have your cholesterol levels checked more often if:  Your lipid or cholesterol levels are high.  You are older than 64 years of age.  You are at high risk for heart disease.  CANCER SCREENING   Lung Cancer  Lung cancer screening is recommended for adults 75-25 years old who are at high risk for lung cancer because of a history of smoking.  A yearly low-dose CT scan of the lungs is recommended for people who:  Currently smoke.  Have quit within the past 15 years.  Have at least a 30-pack-year history of smoking. A pack year is smoking an average of one pack of cigarettes a day for 1 year.  Yearly screening should continue until it has been 15 years since you quit.  Yearly screening should stop if you develop a health problem that would prevent you from having lung cancer treatment.  Breast Cancer  Practice breast self-awareness. This means understanding how your breasts normally appear and feel.  It also means doing regular breast self-exams. Let your health care provider know about any changes, no matter how small.  If you are in your  45s or 81s, you should have a clinical breast exam (CBE) by a health care provider every 1-3 years as part of a regular health exam.  If you are 59 or older, have a CBE every year. Also consider having a breast X-ray (mammogram) every year.  If you have a family history of breast cancer, talk to your health care provider about genetic screening.  If you are at high risk for breast cancer, talk to your health care provider about having an MRI and a mammogram every year.  Breast cancer gene (BRCA) assessment is recommended for women who have family  members with BRCA-related cancers. BRCA-related cancers include:  Breast.  Ovarian.  Tubal.  Peritoneal cancers.  Results of the assessment will determine the need for genetic counseling and BRCA1 and BRCA2 testing. Cervical Cancer Your health care provider may recommend that you be screened regularly for cancer of the pelvic organs (ovaries, uterus, and vagina). This screening involves a pelvic examination, including checking for microscopic changes to the surface of your cervix (Pap test). You may be encouraged to have this screening done every 3 years, beginning at age 77.  For women ages 51-65, health care providers may recommend pelvic exams and Pap testing every 3 years, or they may recommend the Pap and pelvic exam, combined with testing for human papilloma virus (HPV), every 5 years. Some types of HPV increase your risk of cervical cancer. Testing for HPV may also be done on women of any age with unclear Pap test results.  Other health care providers may not recommend any screening for nonpregnant women who are considered low risk for pelvic cancer and who do not have symptoms. Ask your health care provider if a screening pelvic exam is right for you.  If you have had past treatment for cervical cancer or a condition that could lead to cancer, you need Pap tests and screening for cancer for at least 20 years after your treatment. If Pap tests have been discontinued, your risk factors (such as having a new sexual partner) need to be reassessed to determine if screening should resume. Some women have medical problems that increase the chance of getting cervical cancer. In these cases, your health care provider may recommend more frequent screening and Pap tests. Colorectal Cancer  This type of cancer can be detected and often prevented.  Routine colorectal cancer screening usually begins at 64 years of age and continues through 64 years of age.  Your health care provider may recommend  screening at an earlier age if you have risk factors for colon cancer.  Your health care provider may also recommend using home test kits to check for hidden blood in the stool.  A small camera at the end of a tube can be used to examine your colon directly (sigmoidoscopy or colonoscopy). This is done to check for the earliest forms of colorectal cancer.  Routine screening usually begins at age 34.  Direct examination of the colon should be repeated every 5-10 years through 64 years of age. However, you may need to be screened more often if early forms of precancerous polyps or small growths are found. Skin Cancer  Check your skin from head to toe regularly.  Tell your health care provider about any new moles or changes in moles, especially if there is a change in a mole's shape or color.  Also tell your health care provider if you have a mole that is larger than the size of a pencil eraser.  Always use sunscreen. Apply sunscreen liberally and repeatedly throughout the day.  Protect yourself by wearing long sleeves, pants, a wide-brimmed hat, and sunglasses whenever you are outside. HEART DISEASE, DIABETES, AND HIGH BLOOD PRESSURE   High blood pressure causes heart disease and increases the risk of stroke. High blood pressure is more likely to develop in:  People who have blood pressure in the high end of the normal range (130-139/85-89 mm Hg).  People who are overweight or obese.  People who are African American.  If you are 37-81 years of age, have your blood pressure checked every 3-5 years. If you are 95 years of age or older, have your blood pressure checked every year. You should have your blood pressure measured twice--once when you are at a hospital or clinic, and once when you are not at a hospital or clinic. Record the average of the two measurements. To check your blood pressure when you are not at a hospital or clinic, you can use:  An automated blood pressure machine at a  pharmacy.  A home blood pressure monitor.  If you are between 51 years and 29 years old, ask your health care provider if you should take aspirin to prevent strokes.  Have regular diabetes screenings. This involves taking a blood sample to check your fasting blood sugar level.  If you are at a normal weight and have a low risk for diabetes, have this test once every three years after 64 years of age.  If you are overweight and have a high risk for diabetes, consider being tested at a younger age or more often. PREVENTING INFECTION  Hepatitis B  If you have a higher risk for hepatitis B, you should be screened for this virus. You are considered at high risk for hepatitis B if:  You were born in a country where hepatitis B is common. Ask your health care provider which countries are considered high risk.  Your parents were born in a high-risk country, and you have not been immunized against hepatitis B (hepatitis B vaccine).  You have HIV or AIDS.  You use needles to inject street drugs.  You live with someone who has hepatitis B.  You have had sex with someone who has hepatitis B.  You get hemodialysis treatment.  You take certain medicines for conditions, including cancer, organ transplantation, and autoimmune conditions. Hepatitis C  Blood testing is recommended for:  Everyone born from [birth year] through 1964.  Anyone with known risk factors for hepatitis C. Sexually transmitted infections (STIs)  You should be screened for sexually transmitted infections (STIs) including gonorrhea and chlamydia if:  You are sexually active and are younger than 64 years of age.  You are older than 64 years of age and your health care provider tells you that you are at risk for this type of infection.  Your sexual activity has changed since you were last screened and you are at an increased risk for chlamydia or gonorrhea. Ask your health care provider if you are at risk.  If you do not  have HIV, but are at risk, it may be recommended that you take a prescription medicine daily to prevent HIV infection. This is called pre-exposure prophylaxis (PrEP). You are considered at risk if:  You are sexually active and do not regularly use condoms or know the HIV status of your partner(s).  You take drugs by injection.  You are sexually active with a partner who has HIV. Talk with your health  care provider about whether you are at high risk of being infected with HIV. If you choose to begin PrEP, you should first be tested for HIV. You should then be tested every 3 months for as long as you are taking PrEP.  PREGNANCY   If you are premenopausal and you may become pregnant, ask your health care provider about preconception counseling.  If you may become pregnant, take 400 to 800 micrograms (mcg) of folic acid every day.  If you want to prevent pregnancy, talk to your health care provider about birth control (contraception). OSTEOPOROSIS AND MENOPAUSE   Osteoporosis is a disease in which the bones lose minerals and strength with aging. This can result in serious bone fractures. Your risk for osteoporosis can be identified using a bone density scan.  If you are 72 years of age or older, or if you are at risk for osteoporosis and fractures, ask your health care provider if you should be screened.  Ask your health care provider whether you should take a calcium or vitamin D supplement to lower your risk for osteoporosis.  Menopause may have certain physical symptoms and risks.  Hormone replacement therapy may reduce some of these symptoms and risks. Talk to your health care provider about whether hormone replacement therapy is right for you.  HOME CARE INSTRUCTIONS   Schedule regular health, dental, and eye exams.  Stay current with your immunizations.   Do not use any tobacco products including cigarettes, chewing tobacco, or electronic cigarettes.  If you are pregnant, do not  drink alcohol.  If you are breastfeeding, limit how much and how often you drink alcohol.  Limit alcohol intake to no more than 1 drink per day for nonpregnant women. One drink equals 12 ounces of beer, 5 ounces of wine, or 1 ounces of hard liquor.  Do not use street drugs.  Do not share needles.  Ask your health care provider for help if you need support or information about quitting drugs.  Tell your health care provider if you often feel depressed.  Tell your health care provider if you have ever been abused or do not feel safe at home.   This information is not intended to replace advice given to you by your health care provider. Make sure you discuss any questions you have with your health care provider.   Document Released: 03/31/2011 Document Revised: 10/06/2014 Document Reviewed: 08/17/2013 Elsevier Interactive Patient Education [employer].

## 2016-07-08 NOTE — Assessment & Plan Note (Signed)
Stable on current regimen. Continue. ?

## 2016-07-08 NOTE — Assessment & Plan Note (Signed)
Chronic, stable. Continue current regimen.  Continue victoza 1.8mg  injection daily as well as metformin 1000mg  daily.

## 2016-07-08 NOTE — Assessment & Plan Note (Addendum)
Presumed shingles improving after treatment with antiviral and antibiotic.

## 2016-07-08 NOTE — Assessment & Plan Note (Signed)
Chronic, stable. Continue current simvastatin dose.

## 2016-07-09 ENCOUNTER — Telehealth: Payer: Self-pay | Admitting: *Deleted

## 2016-07-09 NOTE — Telephone Encounter (Signed)
Received referral for initial lung cancer screening scan. Contacted patient and obtained smoking history,(former, quit 2010, 45 pack year) as well as answering questions related to screening process. Patient denies signs of lung cancer such as weight loss or hemoptysis. Patient denies comorbidity that would prevent curative treatment if lung cancer were found. Patient is tentatively scheduled for shared decision making visit and CT scan on 07/15/16 at 1:30pm, pending insurance approval from business office.

## 2016-07-14 ENCOUNTER — Other Ambulatory Visit: Payer: Self-pay | Admitting: *Deleted

## 2016-07-14 DIAGNOSIS — Z87891 Personal history of nicotine dependence: Secondary | ICD-10-CM

## 2016-07-15 ENCOUNTER — Inpatient Hospital Stay: Payer: Medicare Other | Attending: Oncology | Admitting: Oncology

## 2016-07-15 ENCOUNTER — Ambulatory Visit
Admission: RE | Admit: 2016-07-15 | Discharge: 2016-07-15 | Disposition: A | Discharge status: Home / Self Care | Payer: Medicare Other | Source: Ambulatory Visit | Attending: Oncology | Admitting: Oncology

## 2016-07-15 DIAGNOSIS — Z122 Encounter for screening for malignant neoplasm of respiratory organs: Secondary | ICD-10-CM | POA: Insufficient documentation

## 2016-07-15 DIAGNOSIS — J439 Emphysema, unspecified: Secondary | ICD-10-CM | POA: Insufficient documentation

## 2016-07-15 DIAGNOSIS — I2510 Atherosclerotic heart disease of native coronary artery without angina pectoris: Secondary | ICD-10-CM | POA: Insufficient documentation

## 2016-07-15 DIAGNOSIS — Z87891 Personal history of nicotine dependence: Secondary | ICD-10-CM

## 2016-07-17 ENCOUNTER — Telehealth: Payer: Self-pay | Admitting: *Deleted

## 2016-07-17 NOTE — Telephone Encounter (Signed)
Voicemail left at # in EMR in attempt to notify patient of LDCT lung cancer screening results with recommendation for 12 month follow up imaging. Also upon return call, patient will be notified of incidental finding noted below. This note will be sent to PCP via Epic.   IMPRESSION: Lung-RADS Category 2, benign appearance or behavior. Continue annual screening with low-dose chest CT without contrast in 12 months.  Emphysema.  Coronary artery atherosclerosis.

## 2016-07-18 DIAGNOSIS — Z87891 Personal history of nicotine dependence: Secondary | ICD-10-CM | POA: Insufficient documentation

## 2016-07-18 NOTE — Progress Notes (Signed)
In accordance with CMS guidelines, patient has met eligibility criteria including age, absence of signs or symptoms of lung cancer.  Social History  Substance Use Topics  . Smoking status: Former Smoker    Packs/day: 1.50    Types: Cigarettes    Quit date: 04/29/2009  . Smokeless tobacco: Never Used  . Alcohol use Yes     Comment: very rare     A shared decision-making session was conducted prior to the performance of CT scan. This includes one or more decision aids, includes benefits and harms of screening, follow-up diagnostic testing, over-diagnosis, false positive rate, and total radiation exposure.  Counseling on the importance of adherence to annual lung cancer LDCT screening, impact of co-morbidities, and ability or willingness to undergo diagnosis and treatment is imperative for compliance of the program.  Counseling on the importance of continued smoking cessation for former smokers; the importance of smoking cessation for current smokers, and information about tobacco cessation interventions have been given to patient including [place] and [address] quit [place] programs.  Written order for lung cancer screening with LDCT has been given to the patient and any and all questions have been answered to the best of my abilities.   Yearly follow up will be coordinated by Burgess Estelle, Thoracic Navigator.

## 2016-07-30 DIAGNOSIS — J432 Centrilobular emphysema: Secondary | ICD-10-CM

## 2016-07-30 DIAGNOSIS — J4489 Other specified chronic obstructive pulmonary disease: Secondary | ICD-10-CM | POA: Insufficient documentation

## 2016-07-30 DIAGNOSIS — I2510 Atherosclerotic heart disease of native coronary artery without angina pectoris: Secondary | ICD-10-CM

## 2016-07-30 HISTORY — DX: Atherosclerotic heart disease of native coronary artery without angina pectoris: I25.10

## 2016-07-30 HISTORY — DX: Centrilobular emphysema: J43.2

## 2016-08-20 ENCOUNTER — Other Ambulatory Visit: Payer: Self-pay | Admitting: Family Medicine

## 2016-08-25 ENCOUNTER — Encounter: Payer: Self-pay | Admitting: Family Medicine

## 2016-08-25 DIAGNOSIS — I700 Atherosclerosis of aorta: Secondary | ICD-10-CM | POA: Insufficient documentation

## 2016-09-24 ENCOUNTER — Other Ambulatory Visit: Payer: Self-pay | Admitting: Family Medicine

## 2016-10-07 ENCOUNTER — Other Ambulatory Visit: Payer: Self-pay | Admitting: Neurosurgery

## 2016-10-07 DIAGNOSIS — G8929 Other chronic pain: Secondary | ICD-10-CM

## 2016-10-07 DIAGNOSIS — M545 Low back pain: Secondary | ICD-10-CM | POA: Diagnosis not present

## 2016-10-15 ENCOUNTER — Ambulatory Visit: Payer: Medicare Other | Admitting: Family Medicine

## 2016-10-17 ENCOUNTER — Ambulatory Visit
Admission: RE | Admit: 2016-10-17 | Discharge: 2016-10-17 | Disposition: A | Discharge status: Home / Self Care | Payer: Medicare Other | Source: Ambulatory Visit | Attending: Neurosurgery | Admitting: Neurosurgery

## 2016-10-17 ENCOUNTER — Other Ambulatory Visit: Payer: Self-pay

## 2016-10-17 DIAGNOSIS — M545 Low back pain: Secondary | ICD-10-CM | POA: Diagnosis not present

## 2016-10-17 DIAGNOSIS — M5126 Other intervertebral disc displacement, lumbar region: Secondary | ICD-10-CM | POA: Diagnosis not present

## 2016-10-17 DIAGNOSIS — G8929 Other chronic pain: Secondary | ICD-10-CM

## 2016-10-17 LAB — POCT I-STAT CREATININE: Creatinine, Ser: 0.8 mg/dL (ref 0.44–1.00)

## 2016-10-17 MED ORDER — PEN NEEDLES 32G X 4 MM MISC: 1.8000 mg | Freq: Every day | 1 refills | Status: DC

## 2016-10-17 MED ORDER — GADOBENATE DIMEGLUMINE 529 MG/ML IV SOLN
20.0000 mL | Freq: Once | INTRAVENOUS | Status: AC | PRN
  Administered 2016-10-17: 20 mL via INTRAVENOUS

## 2016-10-17 NOTE — Telephone Encounter (Signed)
Pt request 32G 4 mm pen needles to give victoza; [facility]; seen 07/08/16. Refilled per protocol.

## 2016-10-20 ENCOUNTER — Encounter: Payer: Self-pay | Admitting: Family Medicine

## 2016-10-20 ENCOUNTER — Ambulatory Visit (INDEPENDENT_AMBULATORY_CARE_PROVIDER_SITE_OTHER): Payer: Medicare Other | Admitting: Family Medicine

## 2016-10-20 ENCOUNTER — Ambulatory Visit
Admission: RE | Admit: 2016-10-20 | Discharge: 2016-10-20 | Disposition: A | Discharge status: Home / Self Care | Payer: Medicare Other | Source: Ambulatory Visit | Attending: Family Medicine | Admitting: Family Medicine

## 2016-10-20 VITALS — BP 128/78 | HR 84 | Temp 97.7°F | Wt 374.2 lb

## 2016-10-20 DIAGNOSIS — M160 Bilateral primary osteoarthritis of hip: Secondary | ICD-10-CM

## 2016-10-20 DIAGNOSIS — A4902 Methicillin resistant Staphylococcus aureus infection, unspecified site: Secondary | ICD-10-CM | POA: Diagnosis not present

## 2016-10-20 DIAGNOSIS — Z96642 Presence of left artificial hip joint: Secondary | ICD-10-CM | POA: Diagnosis not present

## 2016-10-20 DIAGNOSIS — M25552 Pain in left hip: Secondary | ICD-10-CM

## 2016-10-20 DIAGNOSIS — E1142 Type 2 diabetes mellitus with diabetic polyneuropathy: Secondary | ICD-10-CM

## 2016-10-20 DIAGNOSIS — Z6841 Body Mass Index (BMI) 40.0 and over, adult: Secondary | ICD-10-CM

## 2016-10-20 DIAGNOSIS — G894 Chronic pain syndrome: Secondary | ICD-10-CM

## 2016-10-20 LAB — CBC WITH DIFFERENTIAL/PLATELET
Basophils Absolute: 0 10*3/uL (ref 0.0–0.1)
Basophils Relative: 0.4 % (ref 0.0–3.0)
Eosinophils Absolute: 0.2 10*3/uL (ref 0.0–0.7)
Eosinophils Relative: 2.2 % (ref 0.0–5.0)
HCT: 39.2 % (ref 36.0–46.0)
Hemoglobin: 13.2 g/dL (ref 12.0–15.0)
Lymphocytes Relative: 30.1 % (ref 12.0–46.0)
Lymphs Abs: 2.1 10*3/uL (ref 0.7–4.0)
MCHC: 33.8 g/dL (ref 30.0–36.0)
MCV: 85.9 fl (ref 78.0–100.0)
Monocytes Absolute: 0.7 10*3/uL (ref 0.1–1.0)
Monocytes Relative: 10.2 % (ref 3.0–12.0)
Neutro Abs: 4 10*3/uL (ref 1.4–7.7)
Neutrophils Relative %: 57.1 % (ref 43.0–77.0)
Platelets: 231 10*3/uL (ref 150.0–400.0)
RBC: 4.56 Mil/uL (ref 3.87–5.11)
RDW: 15.3 % (ref 11.5–15.5)
WBC: 7.1 10*3/uL (ref 4.0–10.5)

## 2016-10-20 LAB — BASIC METABOLIC PANEL
BUN: 19 mg/dL (ref 6–23)
CO2: 33 mEq/L — ABNORMAL HIGH (ref 19–32)
Calcium: 10.3 mg/dL (ref 8.4–10.5)
Chloride: 96 mEq/L (ref 96–112)
Creatinine, Ser: 0.77 mg/dL (ref 0.40–1.20)
GFR: 80.12 mL/min (ref >60.00)
Glucose, Bld: 109 mg/dL — ABNORMAL HIGH (ref 70–99)
Potassium: 4.4 mEq/L (ref 3.5–5.1)
Sodium: 136 mEq/L (ref 135–145)

## 2016-10-20 LAB — HEMOGLOBIN A1C: Hgb A1c MFr Bld: 8.4 % — ABNORMAL HIGH (ref 4.6–6.5)

## 2016-10-20 LAB — SEDIMENTATION RATE: Sed Rate: 6 mm/hr (ref 0–30)

## 2016-10-20 NOTE — Assessment & Plan Note (Signed)
Pain more localized to L buttock and laterally, not at groin. ?sciatica vs GTB. However given hip pathology and history will check xray to eval joint space/THR.  Check ESR/CRP as well.  Treat with flexeril muscle relaxant, continue narcotic, NSAID, heating pad, TENS unit.

## 2016-10-20 NOTE — Assessment & Plan Note (Signed)
Chronic, endorses some elevated readings recently. Update A1c today, continue current regimen at this time.

## 2016-10-20 NOTE — Progress Notes (Signed)
Pre visit review using our clinic review tool, if applicable. No additional management support is needed unless otherwise documented below in the visit note. 

## 2016-10-20 NOTE — Progress Notes (Signed)
BP 128/78   Pulse 84   Temp 97.7 F (36.5 C) (Oral)   Wt (!) 374 lb 4 oz (169.8 kg)   BMI 64.24 kg/m    CC: f/u visit Subjective:    Patient ID: Amy Barnes, female    DOB: 01/06/1951, 66 y.o.   MRN: [id number]  HPI: Amy Barnes is a 66 y.o. female presenting on 10/20/2016 for Check place on chest and check sugar and Hip Pain (left;)   DM - regularly does check sugars fasting: 130-150s. Compliant with antihyperglycemic regimen which includes: metformin XR '1000mg'$ , victoza 1.'8mg'$  daily. Denies low sugars or hypoglycemic symptoms. Last diabetic eye exam 11/2015.  Pneumovax: 2013.  Prevnar: not yet due. 1700 cal low carb diet - working on this with daughter.  Lab Results  Component Value Date   HGBA1C 7.1 (H) 07/01/2016   Diabetic Foot Exam - Simple   Simple Foot Form Diabetic Foot exam was performed with the following findings:  Yes 10/20/2016 10:58 AM  Visual Inspection No deformities, no ulcerations, no other skin breakdown bilaterally:  Yes Sensation Testing Intact to touch and monofilament testing bilaterally:  Yes Pulse Check See comments:  Yes Comments 2+ DP in left 1+ DP on right      Increasing pain noted at left hip - this feels like when she had infection. Last surgery was 2014 - revision of total hip arthroplasty. Denies fevers/chills, no redness or warmth of hip. Points to posterior L buttock - severe sharp stabbing pain without radiation. Pain started after trip to [facility] 08/30/2016 using low lying toilets. Treated pain with dialudid '2mg'$  - usually doesn't like taking this medication. Trouble sleeping.   Chronic lower back pain - saw Dr [name], recent MRI last week: IMPRESSION: 1. L4-5 facet arthropathy with 7 mm left synovial cyst impinging on the L5 nerve root in the subarticular recess.  2. L3-4 discectomy and posterior fusion. The pedicle screws have prominent lucency on recent radiography. No impingement. 3. L2-3 mild facet arthropathy without  impingement. Electronically Signed   By: Monte Fantasia M.D.   On: 10/17/2016 15:14  Relevant past medical, surgical, family and social history reviewed and updated as indicated. Interim medical history since our last visit reviewed. Allergies and medications reviewed and updated. Current Outpatient Prescriptions on File Prior to Visit  Medication Sig  . albuterol (PROVENTIL HFA;VENTOLIN HFA) 108 (90 BASE) MCG/ACT inhaler Inhale 2 puffs into the lungs every 6 (six) hours as needed for wheezing or shortness of breath. Reported on 04/17/2016  . aspirin 81 MG EC tablet Take 81 mg by mouth at bedtime.   . Calcium Carbonate-Vitamin D (CALCIUM 600+D) 600-400 MG-UNIT tablet Take 1 tablet by mouth daily.  . celecoxib (CELEBREX) 200 MG capsule Take 1 capsule (200 mg total) by mouth 2 (two) times daily.  . clotrimazole (LOTRIMIN) 1 % cream Apply topically 2 (two) times daily. In groin  . doxycycline (VIBRA-TABS) 100 MG tablet Take 100 mg by mouth at bedtime. Reported on 12/31/2015  . Fluticasone-Salmeterol (ADVAIR) 250-50 MCG/DOSE AEPB Inhale 1 puff into the lungs 2 (two) times daily.  . furosemide (LASIX) 40 MG tablet TAKE 1 TABLET BY MOUTH 2 TIMES DAILY.  Marland Kitchen glucosamine-chondroitin 500-400 MG tablet Take 1 tablet by mouth 2 (two) times daily. Reported on 04/17/2016  . HYDROmorphone (DILAUDID) 2 MG tablet Take 2 mg by mouth every 6 (six) hours as needed for severe pain.  . Insulin Pen Needle (PEN NEEDLES) 32G X 4 MM MISC 1.8 mg  by Does not apply route daily. Use pen needle to adminster Victoza once daily and as directed. Dx E11.42  . lamoTRIgine (LAMICTAL) 200 MG tablet Take 200 mg by mouth daily.    Marland Kitchen levothyroxine (SYNTHROID, LEVOTHROID) 150 MCG tablet Take 150 mcg by mouth daily before breakfast.   . Linaclotide (LINZESS) 145 MCG CAPS capsule Take 1 capsule (145 mcg total) by mouth daily.  . Liraglutide (VICTOZA) 18 MG/3ML SOPN Inject 1.8 mg into the skin daily.  Marland Kitchen losartan (COZAAR) 50 MG tablet Take 1  tablet (50 mg total) by mouth daily.  . metFORMIN (GLUCOPHAGE-XR) 500 MG 24 hr tablet Take 2 tablets (1,000 mg total) by mouth daily with breakfast.  . mometasone-formoterol (DULERA) 100-5 MCG/ACT AERO Inhale 2 puffs into the lungs 2 (two) times daily.  . montelukast (SINGULAIR) 10 MG tablet Take 1 tablet (10 mg total) by mouth at bedtime.  . Multiple Vitamin (MULTIVITAMIN WITH MINERALS) TABS tablet Take 1 tablet by mouth daily.  . ondansetron (ZOFRAN) 8 MG tablet Take 8 mg by mouth every 8 (eight) hours as needed for nausea or vomiting.   . Oxcarbazepine (TRILEPTAL) 300 MG tablet Take 300-600 mg by mouth 2 (two) times daily. Pt takes one tablet in the morning and two in the evening.  Marland Kitchen oxyCODONE-acetaminophen (PERCOCET/ROXICET) 5-325 MG tablet Take 1 tablet by mouth every 6 (six) hours as needed for moderate pain.  . pantoprazole (PROTONIX) 40 MG tablet Take 1 tablet (40 mg total) by mouth daily.  . polyethylene glycol (MIRALAX / GLYCOLAX) packet Take 17 g by mouth daily as needed for mild constipation.   . promethazine (PHENERGAN) 25 MG tablet Take 25 mg by mouth every 6 (six) hours as needed for nausea or vomiting.  . simvastatin (ZOCOR) 40 MG tablet Take 1 tablet (40 mg total) by mouth every evening.  . zaleplon (SONATA) 10 MG capsule Take 10-20 mg by mouth at bedtime as needed for sleep. Reported on 04/17/2016   Current Facility-Administered Medications on File Prior to Visit  Medication  . lactated ringers infusion 1,000 mL    Review of Systems Per HPI unless specifically indicated in ROS section     Objective:    BP 128/78   Pulse 84   Temp 97.7 F (36.5 C) (Oral)   Wt (!) 374 lb 4 oz (169.8 kg)   BMI 64.24 kg/m   Wt Readings from Last 3 Encounters:  10/20/16 (!) 374 lb 4 oz (169.8 kg)  07/15/16 (!) 385 lb (174.6 kg)  07/08/16 (!) 384 lb 4 oz (174.3 kg)    Physical Exam  Constitutional: She is oriented to person, place, and time. She appears well-developed and  well-nourished. No distress.  HENT:  Head: Normocephalic and atraumatic.  Right Ear: External ear normal.  Left Ear: External ear normal.  Nose: Nose normal.  Mouth/Throat: Oropharynx is clear and moist. No oropharyngeal exudate.  Eyes: Conjunctivae and EOM are normal. Pupils are equal, round, and reactive to light. No scleral icterus.  Neck: Normal range of motion. Neck supple.  Cardiovascular: Normal rate, regular rhythm, normal heart sounds and intact distal pulses.   No murmur heard. Pulmonary/Chest: Effort normal and breath sounds normal. No respiratory distress. She has no wheezes. She has no rales.  Musculoskeletal: She exhibits no edema.  See HPI for foot exam if done Tender with int/ext rotation at hip and seated SLR on left however pain localizes to L buttock Marked tenderness to palpation at L GTB, L sciatic notch, throughout L  buttock No calf pain Exam limited by body habitus  Lymphadenopathy:    She has no cervical adenopathy.  Neurological: She is alert and oriented to person, place, and time.  Skin: Skin is warm and dry. No rash noted.  Psychiatric: She has a normal mood and affect.  Nursing note and vitals reviewed.  Results for orders placed or performed during the hospital encounter of 10/17/16  I-STAT creatinine  Result Value Ref Range   Creatinine, Ser 0.80 0.44 - 1.00 mg/dL   Lab Results  Component Value Date   TSH 1.40 07/01/2016       Assessment & Plan:   Problem List Items Addressed This Visit    Chronic pain syndrome   Degenerative joint disease (DJD) of hip   Relevant Orders   DG HIP UNILAT WITH PELVIS MIN 4 VIEWS LEFT   DM type 2 with diabetic peripheral neuropathy (HCC)    Chronic, endorses some elevated readings recently. Update A1c today, continue current regimen at this time.      Relevant Orders   Hemoglobin O2D   Basic metabolic panel   HIP PAIN - Primary    Pain more localized to L buttock and laterally, not at groin. ?sciatica vs  GTB. However given hip pathology and history will check xray to eval joint space/THR.  Check ESR/CRP as well.  Treat with flexeril muscle relaxant, continue narcotic, NSAID, heating pad, TENS unit.       Relevant Orders   Sedimentation rate   DG HIP UNILAT WITH PELVIS MIN 4 VIEWS LEFT   CBC with Differential/Platelet   Morbid obesity with BMI of 60.0-69.9, adult ([place])    Congratulated on weight loss noted.       Relevant Orders   DG HIP UNILAT WITH PELVIS MIN 4 VIEWS LEFT   MRSA (methicillin resistant Staphylococcus aureus) infection       Follow up plan: Return in about 4 months (around 02/17/2017), or if symptoms worsen or fail to improve, for follow up visit.  Ria Bush, MD

## 2016-10-20 NOTE — Addendum Note (Signed)
Addended by: [name] on: 10/20/2016 01:39 PM   Modules accepted: Orders

## 2016-10-20 NOTE — Assessment & Plan Note (Signed)
Congratulated on weight loss noted.  

## 2016-10-20 NOTE — Patient Instructions (Addendum)
Labs today.  Go to [facility] for left hip xray.  Start muscle relaxant flexeril sent to pharmacy.  Continue TENS unit and heating pad.  Let me know how you do.

## 2016-10-21 ENCOUNTER — Other Ambulatory Visit: Payer: Self-pay | Admitting: Family Medicine

## 2016-10-23 DIAGNOSIS — S32009K Unspecified fracture of unspecified lumbar vertebra, subsequent encounter for fracture with nonunion: Secondary | ICD-10-CM | POA: Diagnosis not present

## 2016-10-23 DIAGNOSIS — M5416 Radiculopathy, lumbar region: Secondary | ICD-10-CM | POA: Diagnosis not present

## 2016-10-23 DIAGNOSIS — M545 Low back pain: Secondary | ICD-10-CM | POA: Diagnosis not present

## 2016-10-23 DIAGNOSIS — M7138 Other bursal cyst, other site: Secondary | ICD-10-CM | POA: Diagnosis not present

## 2016-10-23 DIAGNOSIS — Z6841 Body Mass Index (BMI) 40.0 and over, adult: Secondary | ICD-10-CM | POA: Diagnosis not present

## 2016-10-24 ENCOUNTER — Other Ambulatory Visit: Payer: Self-pay | Admitting: Family Medicine

## 2016-11-03 ENCOUNTER — Encounter: Payer: Self-pay | Admitting: Family Medicine

## 2016-11-03 ENCOUNTER — Telehealth: Payer: Self-pay | Admitting: Family Medicine

## 2016-11-03 NOTE — Telephone Encounter (Signed)
Pt was seen on 10/20/16.  Pt wants to know if she still needs to come in this week for labs and appt. Please call daughter [name] at [phone] Thanks

## 2016-11-03 NOTE — Telephone Encounter (Signed)
No - we did visit 1/22. May cancel and reschedule for 3 months from now.

## 2016-11-05 ENCOUNTER — Other Ambulatory Visit: Payer: Medicare Other

## 2016-11-06 ENCOUNTER — Other Ambulatory Visit: Payer: Self-pay | Admitting: Neurosurgery

## 2016-11-07 ENCOUNTER — Other Ambulatory Visit: Payer: Self-pay | Admitting: Family Medicine

## 2016-11-07 ENCOUNTER — Ambulatory Visit: Payer: Self-pay | Admitting: Family Medicine

## 2016-11-07 ENCOUNTER — Telehealth: Payer: Self-pay | Admitting: *Deleted

## 2016-11-07 NOTE — Telephone Encounter (Signed)
PA for Celebrex in your IN box for completion.

## 2016-11-07 NOTE — Telephone Encounter (Signed)
Signed and in Kim's box. 

## 2016-11-10 ENCOUNTER — Ambulatory Visit: Payer: Medicare Other | Admitting: Family Medicine

## 2016-11-10 NOTE — Telephone Encounter (Signed)
Form faxed. Will await determination. 

## 2016-11-11 NOTE — Telephone Encounter (Signed)
Celebrex not on formulary. Must try and fail 3 of covered drugs or have written reasons as to why they wouldn't be as effective. (Meloxicam, Naproxen, Ibuprofen-Tier 1; Etodolac-Tier 2; Diclofenac-Tier 4)

## 2016-11-12 ENCOUNTER — Encounter (HOSPITAL_COMMUNITY): Payer: Self-pay | Admitting: *Deleted

## 2016-11-12 NOTE — Telephone Encounter (Signed)
Hunt[place] Dec left v/m requesting cb with more info on medical necessity and what meds pt has tried and failed.

## 2016-11-12 NOTE — Progress Notes (Signed)
Spoke with pt for pre-op call. Pt denies cardiac history, chest pain or sob. Pt is diabetic, type 2. Last A1C was 8.4 on 10/20/16. Pt states her fasting blood sugar is usually around 111-135. Pt instructed to take Victoza as usual tonight, but not to take Metformin in the AM. Instructed pt to check her blood sugar in the AM when she gets up and every 2 hours until she leaves for the hospital. If blood sugar is 70 or below, treat with 1/2 cup of clear juice (apple or cranberry) and recheck blood sugar 15 minutes after drinking juice. If blood sugar continues to be 70 or below, call the Short Stay department and ask to speak to a nurse. Pt voiced understanding.

## 2016-11-13 ENCOUNTER — Encounter (HOSPITAL_COMMUNITY): Payer: Self-pay | Admitting: *Deleted

## 2016-11-13 ENCOUNTER — Inpatient Hospital Stay (HOSPITAL_COMMUNITY): Payer: Medicare Other

## 2016-11-13 ENCOUNTER — Inpatient Hospital Stay (HOSPITAL_COMMUNITY): Payer: Medicare Other | Admitting: Anesthesiology

## 2016-11-13 ENCOUNTER — Encounter (HOSPITAL_COMMUNITY)
Admission: RE | Disposition: A | Discharge status: Home / Self Care | Payer: Self-pay | Source: Ambulatory Visit | Attending: Neurosurgery

## 2016-11-13 ENCOUNTER — Inpatient Hospital Stay (HOSPITAL_COMMUNITY)
Admission: RE | Admit: 2016-11-13 | Discharge: 2016-11-14 | DRG: 516 | Disposition: A | Discharge status: Home / Self Care | Payer: Medicare Other | Source: Ambulatory Visit | Attending: Neurosurgery | Admitting: Neurosurgery

## 2016-11-13 DIAGNOSIS — M7138 Other bursal cyst, other site: Principal | ICD-10-CM | POA: Diagnosis present

## 2016-11-13 DIAGNOSIS — Z9849 Cataract extraction status, unspecified eye: Secondary | ICD-10-CM

## 2016-11-13 DIAGNOSIS — Z833 Family history of diabetes mellitus: Secondary | ICD-10-CM | POA: Diagnosis not present

## 2016-11-13 DIAGNOSIS — M4306 Spondylolysis, lumbar region: Secondary | ICD-10-CM | POA: Diagnosis not present

## 2016-11-13 DIAGNOSIS — Z882 Allergy status to sulfonamides status: Secondary | ICD-10-CM

## 2016-11-13 DIAGNOSIS — Z87891 Personal history of nicotine dependence: Secondary | ICD-10-CM

## 2016-11-13 DIAGNOSIS — Z6841 Body Mass Index (BMI) 40.0 and over, adult: Secondary | ICD-10-CM

## 2016-11-13 DIAGNOSIS — Z7984 Long term (current) use of oral hypoglycemic drugs: Secondary | ICD-10-CM

## 2016-11-13 DIAGNOSIS — Z961 Presence of intraocular lens: Secondary | ICD-10-CM | POA: Diagnosis present

## 2016-11-13 DIAGNOSIS — Z79899 Other long term (current) drug therapy: Secondary | ICD-10-CM

## 2016-11-13 DIAGNOSIS — M5416 Radiculopathy, lumbar region: Secondary | ICD-10-CM | POA: Diagnosis present

## 2016-11-13 DIAGNOSIS — I2510 Atherosclerotic heart disease of native coronary artery without angina pectoris: Secondary | ICD-10-CM | POA: Diagnosis present

## 2016-11-13 DIAGNOSIS — Z881 Allergy status to other antibiotic agents status: Secondary | ICD-10-CM

## 2016-11-13 DIAGNOSIS — S32049K Unspecified fracture of fourth lumbar vertebra, subsequent encounter for fracture with nonunion: Secondary | ICD-10-CM | POA: Diagnosis not present

## 2016-11-13 DIAGNOSIS — Z96642 Presence of left artificial hip joint: Secondary | ICD-10-CM | POA: Diagnosis present

## 2016-11-13 DIAGNOSIS — Z90710 Acquired absence of both cervix and uterus: Secondary | ICD-10-CM | POA: Diagnosis not present

## 2016-11-13 DIAGNOSIS — K219 Gastro-esophageal reflux disease without esophagitis: Secondary | ICD-10-CM | POA: Diagnosis present

## 2016-11-13 DIAGNOSIS — G4733 Obstructive sleep apnea (adult) (pediatric): Secondary | ICD-10-CM | POA: Diagnosis not present

## 2016-11-13 DIAGNOSIS — J432 Centrilobular emphysema: Secondary | ICD-10-CM | POA: Diagnosis present

## 2016-11-13 DIAGNOSIS — E119 Type 2 diabetes mellitus without complications: Secondary | ICD-10-CM | POA: Diagnosis present

## 2016-11-13 DIAGNOSIS — Z981 Arthrodesis status: Secondary | ICD-10-CM | POA: Diagnosis not present

## 2016-11-13 DIAGNOSIS — J45909 Unspecified asthma, uncomplicated: Secondary | ICD-10-CM | POA: Diagnosis not present

## 2016-11-13 DIAGNOSIS — Z885 Allergy status to narcotic agent status: Secondary | ICD-10-CM | POA: Diagnosis not present

## 2016-11-13 DIAGNOSIS — M79605 Pain in left leg: Secondary | ICD-10-CM | POA: Diagnosis not present

## 2016-11-13 DIAGNOSIS — I10 Essential (primary) hypertension: Secondary | ICD-10-CM | POA: Diagnosis present

## 2016-11-13 DIAGNOSIS — F319 Bipolar disorder, unspecified: Secondary | ICD-10-CM | POA: Diagnosis present

## 2016-11-13 DIAGNOSIS — Z8249 Family history of ischemic heart disease and other diseases of the circulatory system: Secondary | ICD-10-CM | POA: Diagnosis not present

## 2016-11-13 DIAGNOSIS — Z888 Allergy status to other drugs, medicaments and biological substances status: Secondary | ICD-10-CM

## 2016-11-13 DIAGNOSIS — Z9889 Other specified postprocedural states: Secondary | ICD-10-CM | POA: Diagnosis not present

## 2016-11-13 DIAGNOSIS — G2581 Restless legs syndrome: Secondary | ICD-10-CM | POA: Diagnosis not present

## 2016-11-13 DIAGNOSIS — Z7951 Long term (current) use of inhaled steroids: Secondary | ICD-10-CM

## 2016-11-13 DIAGNOSIS — Z419 Encounter for procedure for purposes other than remedying health state, unspecified: Secondary | ICD-10-CM

## 2016-11-13 DIAGNOSIS — Z7982 Long term (current) use of aspirin: Secondary | ICD-10-CM

## 2016-11-13 DIAGNOSIS — Z8614 Personal history of Methicillin resistant Staphylococcus aureus infection: Secondary | ICD-10-CM

## 2016-11-13 DIAGNOSIS — S32059K Unspecified fracture of fifth lumbar vertebra, subsequent encounter for fracture with nonunion: Secondary | ICD-10-CM | POA: Diagnosis not present

## 2016-11-13 HISTORY — DX: Drug induced constipation: K59.03

## 2016-11-13 HISTORY — DX: Carpal tunnel syndrome, unspecified upper limb: G56.00

## 2016-11-13 HISTORY — PX: LUMBAR LAMINECTOMY/DECOMPRESSION MICRODISCECTOMY: SHX5026

## 2016-11-13 HISTORY — DX: Pneumonia, unspecified organism: J18.9

## 2016-11-13 HISTORY — DX: Anxiety disorder, unspecified: F41.9

## 2016-11-13 HISTORY — DX: Restless legs syndrome: G25.81

## 2016-11-13 LAB — CBC
HCT: 39.4 % (ref 36.0–46.0)
Hemoglobin: 12.9 g/dL (ref 12.0–15.0)
MCH: 28.3 pg (ref 26.0–34.0)
MCHC: 32.7 g/dL (ref 30.0–36.0)
MCV: 86.4 fL (ref 78.0–100.0)
Platelets: 232 10*3/uL (ref 150–400)
RBC: 4.56 MIL/uL (ref 3.87–5.11)
RDW: 15 % (ref 11.5–15.5)
WBC: 6 10*3/uL (ref 4.0–10.5)

## 2016-11-13 LAB — BASIC METABOLIC PANEL
Anion gap: 13 (ref 5–15)
BUN: 21 mg/dL — ABNORMAL HIGH (ref 6–20)
CO2: 27 mmol/L (ref 22–32)
Calcium: 9.7 mg/dL (ref 8.9–10.3)
Chloride: 99 mmol/L — ABNORMAL LOW (ref 101–111)
Creatinine, Ser: 0.84 mg/dL (ref 0.44–1.00)
GFR calc Af Amer: >60 mL/min (ref >60)
GFR calc non Af Amer: >60 mL/min (ref >60)
Glucose, Bld: 109 mg/dL — ABNORMAL HIGH (ref 65–99)
Potassium: 3.6 mmol/L (ref 3.5–5.1)
Sodium: 139 mmol/L (ref 135–145)

## 2016-11-13 LAB — SURGICAL PCR SCREEN
MRSA, PCR: NEGATIVE
Staphylococcus aureus: NEGATIVE

## 2016-11-13 LAB — GLUCOSE, CAPILLARY
Glucose-Capillary: 81 mg/dL (ref 65–99)
Glucose-Capillary: 86 mg/dL (ref 65–99)
Glucose-Capillary: 87 mg/dL (ref 65–99)
Glucose-Capillary: 97 mg/dL (ref 65–99)

## 2016-11-13 SURGERY — LUMBAR LAMINECTOMY/DECOMPRESSION MICRODISCECTOMY 1 LEVEL
Anesthesia: General

## 2016-11-13 MED ORDER — LIRAGLUTIDE 18 MG/3ML ~~LOC~~ SOPN: 1.8000 mg | PEN_INJECTOR | Freq: Every day | SUBCUTANEOUS | Status: DC

## 2016-11-13 MED ORDER — MUPIROCIN 2 % EX OINT
1.0000 "application " | TOPICAL_OINTMENT | Freq: Once | CUTANEOUS | Status: DC
  Filled 2016-11-13: qty 22

## 2016-11-13 MED ORDER — PANTOPRAZOLE SODIUM 40 MG PO TBEC: 40.0000 mg | DELAYED_RELEASE_TABLET | Freq: Every day | ORAL | Status: DC

## 2016-11-13 MED ORDER — HYDROMORPHONE HCL 1 MG/ML IJ SOLN
INTRAMUSCULAR | Status: AC
  Filled 2016-11-13: qty 1

## 2016-11-13 MED ORDER — PHENYLEPHRINE HCL 10 MG/ML IJ SOLN
INTRAMUSCULAR | Status: DC | PRN
  Administered 2016-11-13 (×2): 80 ug via INTRAVENOUS

## 2016-11-13 MED ORDER — BUPIVACAINE-EPINEPHRINE (PF) 0.5% -1:200000 IJ SOLN
INTRAMUSCULAR | Status: DC | PRN
  Administered 2016-11-13: 10 mL via PERINEURAL

## 2016-11-13 MED ORDER — ZOLPIDEM TARTRATE 5 MG PO TABS: 5.0000 mg | ORAL_TABLET | Freq: Every evening | ORAL | Status: DC | PRN

## 2016-11-13 MED ORDER — LIDOCAINE 2% (20 MG/ML) 5 ML SYRINGE
INTRAMUSCULAR | Status: AC
  Filled 2016-11-13: qty 5

## 2016-11-13 MED ORDER — VANCOMYCIN HCL 10 G IV SOLR
1500.0000 mg | INTRAVENOUS | Status: AC
  Administered 2016-11-13: 1500 mg via INTRAVENOUS
  Filled 2016-11-13: qty 1500

## 2016-11-13 MED ORDER — ADULT MULTIVITAMIN W/MINERALS CH
2.0000 | ORAL_TABLET | Freq: Every day | ORAL | Status: DC
  Administered 2016-11-13: 2 via ORAL
  Filled 2016-11-13: qty 2

## 2016-11-13 MED ORDER — ACETAMINOPHEN 650 MG RE SUPP: 650.0000 mg | RECTAL | Status: DC | PRN

## 2016-11-13 MED ORDER — ROCURONIUM BROMIDE 100 MG/10ML IV SOLN
INTRAVENOUS | Status: DC | PRN
  Administered 2016-11-13: 50 mg via INTRAVENOUS

## 2016-11-13 MED ORDER — BACITRACIN 50000 UNITS IM SOLR
INTRAMUSCULAR | Status: DC | PRN
  Administered 2016-11-13: 09:00:00

## 2016-11-13 MED ORDER — LAMOTRIGINE 100 MG PO TABS
200.0000 mg | ORAL_TABLET | Freq: Every day | ORAL | Status: DC
  Filled 2016-11-13 (×2): qty 1

## 2016-11-13 MED ORDER — HYDROMORPHONE HCL 1 MG/ML IJ SOLN
0.2500 mg | INTRAMUSCULAR | Status: DC | PRN
  Administered 2016-11-13 (×4): 0.5 mg via INTRAVENOUS

## 2016-11-13 MED ORDER — SIMVASTATIN 20 MG PO TABS
40.0000 mg | ORAL_TABLET | Freq: Every evening | ORAL | Status: DC
  Filled 2016-11-13: qty 2

## 2016-11-13 MED ORDER — CALCIUM CARBONATE-VITAMIN D 600-400 MG-UNIT PO TABS: 1.0000 | ORAL_TABLET | Freq: Every day | ORAL | Status: DC

## 2016-11-13 MED ORDER — POLYETHYLENE GLYCOL 3350 17 G PO PACK: 17.0000 g | PACK | Freq: Every day | ORAL | Status: DC | PRN

## 2016-11-13 MED ORDER — MORPHINE SULFATE (PF) 4 MG/ML IV SOLN: 1.0000 mg | INTRAVENOUS | Status: DC | PRN

## 2016-11-13 MED ORDER — CELECOXIB 200 MG PO CAPS
200.0000 mg | ORAL_CAPSULE | Freq: Two times a day (BID) | ORAL | Status: DC
  Administered 2016-11-13 (×2): 200 mg via ORAL
  Filled 2016-11-13 (×2): qty 1

## 2016-11-13 MED ORDER — LACTATED RINGERS IV SOLN
INTRAVENOUS | Status: DC
  Administered 2016-11-13 (×2): via INTRAVENOUS

## 2016-11-13 MED ORDER — ONDANSETRON HCL 4 MG/2ML IJ SOLN
INTRAMUSCULAR | Status: AC
  Filled 2016-11-13: qty 2

## 2016-11-13 MED ORDER — FENTANYL CITRATE (PF) 100 MCG/2ML IJ SOLN
INTRAMUSCULAR | Status: AC
Start: 2016-11-13 — End: 2016-11-13
  Filled 2016-11-13: qty 2

## 2016-11-13 MED ORDER — THROMBIN 5000 UNITS EX SOLR
CUTANEOUS | Status: DC | PRN
  Administered 2016-11-13: 08:00:00 via TOPICAL

## 2016-11-13 MED ORDER — BACITRACIN ZINC 500 UNIT/GM EX OINT
TOPICAL_OINTMENT | CUTANEOUS | Status: DC | PRN
  Administered 2016-11-13: 1 via TOPICAL

## 2016-11-13 MED ORDER — OXCARBAZEPINE 300 MG PO TABS: 300.0000 mg | ORAL_TABLET | Freq: Two times a day (BID) | ORAL | Status: DC

## 2016-11-13 MED ORDER — FENTANYL CITRATE (PF) 100 MCG/2ML IJ SOLN
INTRAMUSCULAR | Status: AC
  Filled 2016-11-13: qty 2

## 2016-11-13 MED ORDER — CYCLOBENZAPRINE HCL 10 MG PO TABS
10.0000 mg | ORAL_TABLET | Freq: Three times a day (TID) | ORAL | Status: DC | PRN
  Administered 2016-11-13 (×2): 10 mg via ORAL
  Filled 2016-11-13: qty 1

## 2016-11-13 MED ORDER — MEPERIDINE HCL 25 MG/ML IJ SOLN: 6.2500 mg | INTRAMUSCULAR | Status: DC | PRN

## 2016-11-13 MED ORDER — METFORMIN HCL ER 500 MG PO TB24
1000.0000 mg | ORAL_TABLET | Freq: Every day | ORAL | Status: DC
  Administered 2016-11-14: 1000 mg via ORAL
  Filled 2016-11-13: qty 2

## 2016-11-13 MED ORDER — MIDAZOLAM HCL 2 MG/2ML IJ SOLN
INTRAMUSCULAR | Status: AC
  Filled 2016-11-13: qty 2

## 2016-11-13 MED ORDER — ROCURONIUM BROMIDE 50 MG/5ML IV SOSY
PREFILLED_SYRINGE | INTRAVENOUS | Status: AC
  Filled 2016-11-13: qty 5

## 2016-11-13 MED ORDER — MENTHOL 3 MG MT LOZG: 1.0000 | LOZENGE | OROMUCOSAL | Status: DC | PRN

## 2016-11-13 MED ORDER — LOSARTAN POTASSIUM 50 MG PO TABS: 50.0000 mg | ORAL_TABLET | Freq: Every day | ORAL | Status: DC

## 2016-11-13 MED ORDER — PROPOFOL 10 MG/ML IV BOLUS
INTRAVENOUS | Status: DC | PRN
  Administered 2016-11-13: 30 mg via INTRAVENOUS
  Administered 2016-11-13: 100 mg via INTRAVENOUS

## 2016-11-13 MED ORDER — MONTELUKAST SODIUM 10 MG PO TABS
10.0000 mg | ORAL_TABLET | Freq: Every day | ORAL | Status: DC
  Administered 2016-11-13: 10 mg via ORAL
  Filled 2016-11-13: qty 1

## 2016-11-13 MED ORDER — 0.9 % SODIUM CHLORIDE (POUR BTL) OPTIME
TOPICAL | Status: DC | PRN
  Administered 2016-11-13: 1000 mL

## 2016-11-13 MED ORDER — OXCARBAZEPINE 300 MG PO TABS
600.0000 mg | ORAL_TABLET | Freq: Every day | ORAL | Status: DC
  Administered 2016-11-13: 600 mg via ORAL
  Filled 2016-11-13: qty 2

## 2016-11-13 MED ORDER — SUGAMMADEX SODIUM 200 MG/2ML IV SOLN
INTRAVENOUS | Status: DC | PRN
  Administered 2016-11-13: 200 mg via INTRAVENOUS

## 2016-11-13 MED ORDER — BISACODYL 10 MG RE SUPP
10.0000 mg | Freq: Every day | RECTAL | Status: DC | PRN
Start: 2016-11-13 — End: 2016-11-14

## 2016-11-13 MED ORDER — SODIUM CHLORIDE 0.9 % IV SOLN: 250.0000 mL | INTRAVENOUS | Status: DC

## 2016-11-13 MED ORDER — ACETAMINOPHEN 325 MG PO TABS: 650.0000 mg | ORAL_TABLET | ORAL | Status: DC | PRN

## 2016-11-13 MED ORDER — CYCLOBENZAPRINE HCL 10 MG PO TABS
ORAL_TABLET | ORAL | Status: AC
  Filled 2016-11-13: qty 1

## 2016-11-13 MED ORDER — HYDROMORPHONE HCL 2 MG PO TABS
4.0000 mg | ORAL_TABLET | ORAL | Status: DC | PRN
  Administered 2016-11-13 – 2016-11-14 (×5): 4 mg via ORAL
  Filled 2016-11-13 (×5): qty 2

## 2016-11-13 MED ORDER — CHLORHEXIDINE GLUCONATE CLOTH 2 % EX PADS: 6.0000 | MEDICATED_PAD | Freq: Once | CUTANEOUS | Status: DC

## 2016-11-13 MED ORDER — LIFITEGRAST 5 % OP SOLN
1.0000 [drp] | Freq: Two times a day (BID) | OPHTHALMIC | Status: DC
  Administered 2016-11-13: 1 [drp] via OPHTHALMIC

## 2016-11-13 MED ORDER — PROPOFOL 10 MG/ML IV BOLUS
INTRAVENOUS | Status: AC
  Filled 2016-11-13: qty 20

## 2016-11-13 MED ORDER — SODIUM CHLORIDE 0.9% FLUSH: 3.0000 mL | INTRAVENOUS | Status: DC | PRN

## 2016-11-13 MED ORDER — ALBUTEROL SULFATE (2.5 MG/3ML) 0.083% IN NEBU: 2.5000 mg | INHALATION_SOLUTION | Freq: Four times a day (QID) | RESPIRATORY_TRACT | Status: DC | PRN

## 2016-11-13 MED ORDER — PHENYLEPHRINE HCL 10 MG/ML IJ SOLN
INTRAMUSCULAR | Status: AC
  Filled 2016-11-13: qty 2

## 2016-11-13 MED ORDER — BACITRACIN ZINC 500 UNIT/GM EX OINT
TOPICAL_OINTMENT | CUTANEOUS | Status: AC
  Filled 2016-11-13: qty 28.35

## 2016-11-13 MED ORDER — ONDANSETRON HCL 4 MG PO TABS: 8.0000 mg | ORAL_TABLET | Freq: Three times a day (TID) | ORAL | Status: DC | PRN

## 2016-11-13 MED ORDER — THROMBIN 5000 UNITS EX SOLR
CUTANEOUS | Status: AC
  Filled 2016-11-13: qty 15000

## 2016-11-13 MED ORDER — SODIUM CHLORIDE 0.9% FLUSH
3.0000 mL | Freq: Two times a day (BID) | INTRAVENOUS | Status: DC
  Administered 2016-11-13: 3 mL via INTRAVENOUS

## 2016-11-13 MED ORDER — PHENOL 1.4 % MT LIQD
1.0000 | OROMUCOSAL | Status: DC | PRN
Start: 2016-11-13 — End: 2016-11-14

## 2016-11-13 MED ORDER — THROMBIN 5000 UNITS EX SOLR
CUTANEOUS | Status: DC | PRN
  Administered 2016-11-13 (×2): 5000 [IU] via TOPICAL

## 2016-11-13 MED ORDER — BUPIVACAINE-EPINEPHRINE (PF) 0.5% -1:200000 IJ SOLN
INTRAMUSCULAR | Status: AC
  Filled 2016-11-13: qty 30

## 2016-11-13 MED ORDER — MIDAZOLAM HCL 5 MG/5ML IJ SOLN
INTRAMUSCULAR | Status: DC | PRN
  Administered 2016-11-13: 2 mg via INTRAVENOUS

## 2016-11-13 MED ORDER — LEVOTHYROXINE SODIUM 100 MCG PO TABS
150.0000 ug | ORAL_TABLET | Freq: Every day | ORAL | Status: DC
  Administered 2016-11-13: 150 ug via ORAL
  Filled 2016-11-13: qty 2

## 2016-11-13 MED ORDER — CALCIUM CARBONATE-VITAMIN D 500-200 MG-UNIT PO TABS: 1.0000 | ORAL_TABLET | Freq: Every day | ORAL | Status: DC

## 2016-11-13 MED ORDER — ONDANSETRON HCL 4 MG/2ML IJ SOLN: 4.0000 mg | Freq: Once | INTRAMUSCULAR | Status: DC | PRN

## 2016-11-13 MED ORDER — MOMETASONE FURO-FORMOTEROL FUM 200-5 MCG/ACT IN AERO
2.0000 | INHALATION_SPRAY | Freq: Two times a day (BID) | RESPIRATORY_TRACT | Status: DC
  Administered 2016-11-13 – 2016-11-14 (×2): 2 via RESPIRATORY_TRACT
  Filled 2016-11-13: qty 8.8

## 2016-11-13 MED ORDER — ALBUTEROL SULFATE HFA 108 (90 BASE) MCG/ACT IN AERS: 2.0000 | INHALATION_SPRAY | Freq: Four times a day (QID) | RESPIRATORY_TRACT | Status: DC | PRN

## 2016-11-13 MED ORDER — ONDANSETRON HCL 4 MG/2ML IJ SOLN
INTRAMUSCULAR | Status: DC | PRN
  Administered 2016-11-13: 4 mg via INTRAVENOUS

## 2016-11-13 MED ORDER — FUROSEMIDE 40 MG PO TABS
40.0000 mg | ORAL_TABLET | Freq: Two times a day (BID) | ORAL | Status: DC
  Filled 2016-11-13 (×2): qty 1

## 2016-11-13 MED ORDER — LIDOCAINE HCL (CARDIAC) 20 MG/ML IV SOLN
INTRAVENOUS | Status: DC | PRN
  Administered 2016-11-13: 100 mg via INTRAVENOUS

## 2016-11-13 MED ORDER — PROMETHAZINE HCL 25 MG PO TABS: 25.0000 mg | ORAL_TABLET | Freq: Four times a day (QID) | ORAL | Status: DC | PRN

## 2016-11-13 MED ORDER — DOXYCYCLINE HYCLATE 100 MG PO TABS
100.0000 mg | ORAL_TABLET | Freq: Every day | ORAL | Status: DC
  Administered 2016-11-13: 100 mg via ORAL
  Filled 2016-11-13: qty 1

## 2016-11-13 MED ORDER — ONDANSETRON HCL 4 MG/2ML IJ SOLN: 4.0000 mg | INTRAMUSCULAR | Status: DC | PRN

## 2016-11-13 MED ORDER — HEMOSTATIC AGENTS (NO CHARGE) OPTIME
TOPICAL | Status: DC | PRN
  Administered 2016-11-13: 1 via TOPICAL

## 2016-11-13 MED ORDER — PHENYLEPHRINE HCL 10 MG/ML IJ SOLN
INTRAMUSCULAR | Status: DC | PRN
  Administered 2016-11-13: 10 ug/min via INTRAVENOUS

## 2016-11-13 MED ORDER — DOCUSATE SODIUM 100 MG PO CAPS
100.0000 mg | ORAL_CAPSULE | Freq: Two times a day (BID) | ORAL | Status: DC
  Administered 2016-11-13 (×2): 100 mg via ORAL
  Filled 2016-11-13 (×2): qty 1

## 2016-11-13 MED ORDER — VANCOMYCIN HCL 10 G IV SOLR
1500.0000 mg | Freq: Once | INTRAVENOUS | Status: AC
  Administered 2016-11-13: 1500 mg via INTRAVENOUS
  Filled 2016-11-13: qty 1500

## 2016-11-13 MED ORDER — FENTANYL CITRATE (PF) 100 MCG/2ML IJ SOLN
INTRAMUSCULAR | Status: DC | PRN
  Administered 2016-11-13 (×2): 50 ug via INTRAVENOUS
  Administered 2016-11-13: 200 ug via INTRAVENOUS

## 2016-11-13 MED ORDER — DEXAMETHASONE SODIUM PHOSPHATE 10 MG/ML IJ SOLN
INTRAMUSCULAR | Status: AC
  Filled 2016-11-13: qty 1

## 2016-11-13 MED ORDER — OXCARBAZEPINE 300 MG PO TABS
300.0000 mg | ORAL_TABLET | Freq: Every morning | ORAL | Status: DC
  Administered 2016-11-13: 300 mg via ORAL
  Filled 2016-11-13 (×2): qty 1

## 2016-11-13 SURGICAL SUPPLY — 54 items
APL SKNCLS STERI-STRIP NONHPOA (GAUZE/BANDAGES/DRESSINGS) ×1
BAG DECANTER FOR FLEXI CONT (MISCELLANEOUS) ×3 IMPLANT
BENZOIN TINCTURE PRP APPL 2/3 (GAUZE/BANDAGES/DRESSINGS) ×3 IMPLANT
BLADE CLIPPER SURG (BLADE) IMPLANT
BUR MATCHSTICK NEURO 3.0 LAGG (BURR) ×3 IMPLANT
BUR PRECISION FLUTE 6.0 (BURR) ×3 IMPLANT
CANISTER SUCT 3000ML PPV (MISCELLANEOUS) ×3 IMPLANT
CARTRIDGE OIL MAESTRO DRILL (MISCELLANEOUS) ×1 IMPLANT
CLOSURE WOUND 1/2 X4 (GAUZE/BANDAGES/DRESSINGS) ×1
DIFFUSER DRILL AIR PNEUMATIC (MISCELLANEOUS) ×3 IMPLANT
DRAPE LAPAROTOMY 100X72X124 (DRAPES) ×3 IMPLANT
DRAPE MICROSCOPE LEICA (MISCELLANEOUS) ×3 IMPLANT
DRAPE POUCH INSTRU U-SHP 10X18 (DRAPES) ×3 IMPLANT
DRAPE SURG 17X23 STRL (DRAPES) ×12 IMPLANT
ELECT BLADE 4.0 EZ CLEAN MEGAD (MISCELLANEOUS) ×3
ELECT REM PT RETURN 9FT ADLT (ELECTROSURGICAL) ×3
ELECTRODE BLDE 4.0 EZ CLN MEGD (MISCELLANEOUS) ×1 IMPLANT
ELECTRODE REM PT RTRN 9FT ADLT (ELECTROSURGICAL) ×1 IMPLANT
GAUZE SPONGE 4X4 12PLY STRL (GAUZE/BANDAGES/DRESSINGS) ×3 IMPLANT
GAUZE SPONGE 4X4 16PLY XRAY LF (GAUZE/BANDAGES/DRESSINGS) IMPLANT
GLOVE BIO SURGEON STRL SZ8 (GLOVE) ×5 IMPLANT
GLOVE BIO SURGEON STRL SZ8.5 (GLOVE) ×3 IMPLANT
GLOVE EXAM NITRILE LRG STRL (GLOVE) IMPLANT
GLOVE EXAM NITRILE XL STR (GLOVE) IMPLANT
GLOVE EXAM NITRILE XS STR PU (GLOVE) IMPLANT
GLOVE INDICATOR 7.5 STRL GRN (GLOVE) ×2 IMPLANT
GLOVE INDICATOR 8.0 STRL GRN (GLOVE) ×2 IMPLANT
GLOVE SURG SS PI 7.0 STRL IVOR (GLOVE) ×4 IMPLANT
GOWN STRL REUS W/ TWL LRG LVL3 (GOWN DISPOSABLE) IMPLANT
GOWN STRL REUS W/ TWL XL LVL3 (GOWN DISPOSABLE) ×1 IMPLANT
GOWN STRL REUS W/TWL 2XL LVL3 (GOWN DISPOSABLE) IMPLANT
GOWN STRL REUS W/TWL LRG LVL3 (GOWN DISPOSABLE)
GOWN STRL REUS W/TWL XL LVL3 (GOWN DISPOSABLE) ×3
HEMOSTAT POWDER SURGIFOAM 1G (HEMOSTASIS) ×2 IMPLANT
KIT BASIN OR (CUSTOM PROCEDURE TRAY) ×3 IMPLANT
KIT ROOM TURNOVER OR (KITS) ×3 IMPLANT
NDL HYPO 21X1.5 SAFETY (NEEDLE) IMPLANT
NEEDLE HYPO 21X1.5 SAFETY (NEEDLE) IMPLANT
NEEDLE HYPO 22GX1.5 SAFETY (NEEDLE) ×3 IMPLANT
NS IRRIG 1000ML POUR BTL (IV SOLUTION) ×3 IMPLANT
OIL CARTRIDGE MAESTRO DRILL (MISCELLANEOUS) ×3
PACK LAMINECTOMY NEURO (CUSTOM PROCEDURE TRAY) ×3 IMPLANT
PAD ARMBOARD 7.5X6 YLW CONV (MISCELLANEOUS) ×9 IMPLANT
PATTIES SURGICAL .5 X1 (DISPOSABLE) IMPLANT
RUBBERBAND STERILE (MISCELLANEOUS) ×6 IMPLANT
SPONGE SURGIFOAM ABS GEL SZ50 (HEMOSTASIS) ×3 IMPLANT
STRIP CLOSURE SKIN 1/2X4 (GAUZE/BANDAGES/DRESSINGS) ×2 IMPLANT
SUT VIC AB 1 CT1 18XBRD ANBCTR (SUTURE) ×1 IMPLANT
SUT VIC AB 1 CT1 8-18 (SUTURE) ×3
SUT VIC AB 2-0 CP2 18 (SUTURE) ×3 IMPLANT
TAPE CLOTH SURG 4X10 WHT LF (GAUZE/BANDAGES/DRESSINGS) ×2 IMPLANT
TOWEL OR 17X24 6PK STRL BLUE (TOWEL DISPOSABLE) ×3 IMPLANT
TOWEL OR 17X26 10 PK STRL BLUE (TOWEL DISPOSABLE) ×3 IMPLANT
WATER STERILE IRR 1000ML POUR (IV SOLUTION) ×3 IMPLANT

## 2016-11-13 NOTE — Anesthesia Procedure Notes (Signed)
Procedure Name: Intubation Date/Time: 11/13/2016 9:06 AM Performed by: Kyung Rudd Pre-anesthesia Checklist: Patient identified, Emergency Drugs available, Suction available and Patient being monitored Patient Re-evaluated:Patient Re-evaluated prior to inductionOxygen Delivery Method: Circle system utilized Preoxygenation: Pre-oxygenation with 100% oxygen Intubation Type: IV induction Ventilation: Mask ventilation without difficulty Laryngoscope Size: Mac and 3 Grade View: Grade I Tube type: Oral Tube size: 7.0 mm Number of attempts: 1 Airway Equipment and Method: Stylet Placement Confirmation: ETT inserted through vocal cords under direct vision,  positive ETCO2 and breath sounds checked- equal and bilateral Secured at: 20 cm Tube secured with: Tape Dental Injury: Teeth and Oropharynx as per pre-operative assessment

## 2016-11-13 NOTE — Anesthesia Postprocedure Evaluation (Signed)
Anesthesia Post Note  Patient: AURIYA GIVINS  Procedure(s) Performed: Procedure(s) (LRB): LAMINOTOMY/LAMINECTOMY LUMBAR FOUR LUMBAR FIVE  WITH RESECTION OF SYNOVIAL CYST (N/A)  Patient location during evaluation: PACU Anesthesia Type: General Level of consciousness: awake and alert Pain management: pain level controlled Vital Signs Assessment: post-procedure vital signs reviewed and stable Respiratory status: spontaneous breathing, nonlabored ventilation, respiratory function stable and patient connected to nasal cannula oxygen Cardiovascular status: blood pressure returned to baseline and stable Postop Assessment: no signs of nausea or vomiting Anesthetic complications: no       Last Vitals:  Vitals:   11/13/16 1401 11/13/16 1645  BP: (!) 153/63 116/72  Pulse: 80 84  Resp: 18 18  Temp: 36.6 C 36.8 C    Last Pain:  Vitals:   11/13/16 1239  TempSrc:   PainSc: 5                  Yamilex Borgwardt DAVID

## 2016-11-13 NOTE — H&P (Signed)
Subjective:   The patient is a 66 year old morbidly obese [place] female on whom I performed a L3-4 fusion many years ago. She has developed left leg pain. She has failed medical management. She was worked up with a lumbar MRI which demonstrated a synovial cyst at L4-5 on the left. I discussed the various treatment options with the patient. She has decided to proceed with surgery. Past Medical History:  Diagnosis Date  . Allergic rhinitis   . Anemia   . Anxiety   . Asthma    seasonal  . Bipolar affective disorder (HCC)    takes Synthroid meds for Bipolar  . CAD (coronary artery disease) 07/2016   by CT scan  . Carpal tunnel syndrome   . Cataract   . Centrilobular emphysema (HCC) 07/2016   by CT scan - pt not aware of this  . Constipation due to pain medication   . Depression with anxiety   . Diabetes mellitus   . GERD (gastroesophageal reflux disease)   . 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 Hypertension   . OSA (obstructive sleep apnea)    no longer using cpap, uses a bed that raises and lowers hob  . Osteoarthritis   . Osteoarthritis   . Pneumonia   . Restless legs   . Septic arthritis (HCC) 10/11/2012  . Shortness of breath   . Status post revision of total hip replacement bilateral   prosthetic infection R 2013, L 2015  . Thoracic aortic atherosclerosis (HCC) 11/207   by CT    Past Surgical History:  Procedure Laterality Date  . CARPAL TUNNEL RELEASE  2002  . CERVICAL FUSION  2012   C2/3/4  . COLONOSCOPY WITH PROPOFOL N/A 12/31/2015   diverticulosis, int hem, o/w normal rpt 10 yrs ([place])  . EYE SURGERY Bilateral    cataract surgery with lens implant  . FOOT SURGERY  1982   bone spur  . geniculate injection knee Right 04/2016  . Hospitalized     for bipolar  . I&D EXTREMITY  09/06/2012   Budd Palmer, MD; Right;  I&D of right thigh  . I&D EXTREMITY Left 07/2013   wound vac - daily doxycycline indefinitely  . KNEE  ARTHROSCOPY  09/01/2012   Dannielle Huh, MD;  Right  . NECK SURGERY     Herniated disk C2,3,4  . NOSE SURGERY    . PARTIAL HYSTERECTOMY  1984   for mennorhagia, ovaries remain  . REVISION TOTAL HIP ARTHROPLASTY Left 06/2013  . TMJ ARTHROPLASTY  1982  . TOTAL HIP ARTHROPLASTY Left 2002   failed    Allergies  Allergen Reactions  . Cephalexin Hives  . Hydrocodone Other (See Comments)    Reaction:  Hallucinations   . Risperidone And Related Other (See Comments)    Reaction:  Made pt excessively sleepy  . Seroquel [Quetiapine Fumarate] Other (See Comments)    Reaction:  Made pt excessively sleepy  . Sulfa Antibiotics Rash    Social History  Substance Use Topics  . Smoking status: Former Smoker    Packs/day: 1.50    Types: Cigarettes    Quit date: 04/29/2009  . Smokeless tobacco: Never Used  . Alcohol use Yes     Comment: very rare    Family History  Problem Relation Age of Onset  . Aneurysm Father 62    brain  . Alcohol abuse Father   . Cancer Father     possibly  .  CAD Other     several siblings  . Cancer Brother     prostate  . Diabetes Brother   . Anesthesia problems Neg Hx   . Hypotension Neg Hx   . Malignant hyperthermia Neg Hx   . Pseudochol deficiency Neg Hx    Prior to Admission medications   Medication Sig Start Date End Date Taking? Authorizing Provider  aspirin 81 MG EC tablet Take 81 mg by mouth at bedtime.    Yes Historical Provider, MD  celecoxib (CELEBREX) 200 MG capsule TAKE 1 CAPSULE (200 MG TOTAL) BY MOUTH 2 (TWO) TIMES DAILY. 10/24/16  Yes [name], MD  doxycycline (VIBRA-TABS) 100 MG tablet Take 100 mg by mouth at bedtime. Reported on 12/31/2015   Yes Historical Provider, MD  furosemide (LASIX) 40 MG tablet TAKE 1 TABLET BY MOUTH 2 TIMES DAILY. 09/24/16  Yes [name], MD  HYDROmorphone (DILAUDID) 2 MG tablet Take 2 mg by mouth every 6 (six) hours as needed for severe pain.   Yes Historical Provider, MD  lamoTRIgine (LAMICTAL) 200 MG  tablet Take 200 mg by mouth daily.     Yes Historical Provider, MD  levothyroxine (SYNTHROID, LEVOTHROID) 150 MCG tablet Take 150 mcg by mouth at bedtime.    Yes Historical Provider, MD  Lifitegrast Benay Spice) 5 % SOLN Place 1 drop into both eyes 2 (two) times daily.   Yes Historical Provider, MD  Liraglutide (VICTOZA) 18 MG/3ML SOPN Inject 1.8 mg into the skin at bedtime.    Yes Historical Provider, MD  losartan (COZAAR) 50 MG tablet Take 1 tablet (50 mg total) by mouth daily. 12/11/15  Yes [name], MD  metFORMIN (GLUCOPHAGE-XR) 500 MG 24 hr tablet Take 2 tablets (1,000 mg total) by mouth daily with breakfast. 11/05/15  Yes [name], MD  montelukast (SINGULAIR) 10 MG tablet Take 1 tablet (10 mg total) by mouth at bedtime. 12/11/15  Yes [name], MD  Multiple Vitamin (MULTIVITAMIN WITH MINERALS) TABS tablet Take 2 tablets by mouth daily.    Yes Historical Provider, MD  Oxcarbazepine (TRILEPTAL) 300 MG tablet Take 300-600 mg by mouth 2 (two) times daily. Pt takes one tablet in the morning and two in the evening.   Yes Historical Provider, MD  oxyCODONE-acetaminophen (PERCOCET/ROXICET) 5-325 MG tablet Take 1 tablet by mouth every 6 (six) hours as needed for moderate pain. 11/29/15  Yes Altamese Dilling, MD  pantoprazole (PROTONIX) 40 MG tablet TAKE 1 TABLET (40 MG TOTAL) BY MOUTH DAILY. 11/07/16  Yes [name], MD  polyethylene glycol (MIRALAX / GLYCOLAX) packet Take 17 g by mouth daily as needed for mild constipation.    Yes Historical Provider, MD  promethazine (PHENERGAN) 25 MG tablet Take 25 mg by mouth every 6 (six) hours as needed for nausea or vomiting.   Yes Historical Provider, MD  simvastatin (ZOCOR) 40 MG tablet TAKE 1 TABLET (40 MG TOTAL) BY MOUTH EVERY EVENING. 10/21/16  Yes [name], MD  albuterol (PROVENTIL HFA;VENTOLIN HFA) 108 (90 BASE) MCG/ACT inhaler Inhale 2 puffs into the lungs every 6 (six) hours as needed for wheezing or shortness of breath.  Reported on 04/17/2016    Historical Provider, MD  Calcium Carbonate-Vitamin D (CALCIUM 600+D) 600-400 MG-UNIT tablet Take 1 tablet by mouth daily.    Historical Provider, MD  fluconazole (DIFLUCAN) 150 MG tablet Take 150 mg by mouth See admin instructions. As needed for yeast infection take 150mg  once then repeat in 72 hours.    Historical Provider, MD  Fluticasone-Salmeterol (  ADVAIR) 250-50 MCG/DOSE AEPB Inhale 1 puff into the lungs 2 (two) times daily.    Historical Provider, MD  Insulin Pen Needle (PEN NEEDLES) 32G X 4 MM MISC 1.8 mg by Does not apply route daily. Use pen needle to adminster Victoza once daily and as directed. Dx Z61.09 10/17/16   [name], MD  Linaclotide [facility]) 145 MCG CAPS capsule Take 1 capsule (145 mcg total) by mouth daily. Patient not taking: Reported on 11/12/2016 11/29/15   Altamese Dilling, MD  ondansetron (ZOFRAN) 8 MG tablet Take 8 mg by mouth every 8 (eight) hours as needed for nausea or vomiting.     Historical Provider, MD  zaleplon (SONATA) 10 MG capsule Take 10 mg by mouth at bedtime as needed for sleep. Reported on 04/17/2016    Historical Provider, MD     Review of Systems  Positive ROS: As above  All other systems have been reviewed and were otherwise negative with the exception of those mentioned in the HPI and as above.  Objective: Vital signs in last 24 hours: Temp:  [97.7 F (36.5 C)] 97.7 F (36.5 C) (02/15 0715) Pulse Rate:  [86] 86 (02/15 0715) Resp:  [20] 20 (02/15 0715) BP: (131)/(49) 131/49 (02/15 0715) SpO2:  [96 %] 96 % (02/15 0715) Weight:  [162.8 kg (359 lb)] 162.8 kg (359 lb) (02/15 0838)  General Appearance: Alert, morbidly obese Head: Normocephalic, without obvious abnormality, atraumatic Eyes: PERRL, conjunctiva/corneas clear, EOM's intact,    Ears: Normal  Throat: Normal  Neck: Supple, Back: unremarkable. The patient's lumbar incision is well-healed. Lungs: Clear to auscultation bilaterally, respirations  unlabored Heart: Regular rate and rhythm, no murmur, rub or gallop Abdomen: Soft, non-tender Extremities: Extremities normal, atraumatic, no cyanosis or edema Skin: unremarkable  NEUROLOGIC:   Mental status: alert and oriented,Motor Exam - grossly normal Sensory Exam - grossly normal Reflexes:  Coordination - grossly normal Gait - grossly normal Balance - grossly normal Cranial Nerves: I: smell Not tested  II: visual acuity  OS: Normal  OD: Normal   II: visual fields Full to confrontation  II: pupils Equal, round, reactive to light  III,VII: ptosis None  III,IV,VI: extraocular muscles  Full ROM  V: mastication Normal  V: facial light touch sensation  Normal  V,VII: corneal reflex  Present  VII: facial muscle function - upper  Normal  VII: facial muscle function - lower Normal  VIII: hearing Not tested  IX: soft palate elevation  Normal  IX,X: gag reflex Present  XI: trapezius strength  5/5  XI: sternocleidomastoid strength 5/5  XI: neck flexion strength  5/5  XII: tongue strength  Normal    Data Review Lab Results  Component Value Date   WBC 6.0 11/13/2016   HGB 12.9 11/13/2016   HCT 39.4 11/13/2016   MCV 86.4 11/13/2016   PLT 232 11/13/2016   Lab Results  Component Value Date   NA 139 11/13/2016   K 3.6 11/13/2016   CL 99 (L) 11/13/2016   CO2 27 11/13/2016   BUN 21 (H) 11/13/2016   CREATININE 0.84 11/13/2016   GLUCOSE 109 (H) 11/13/2016   Lab Results  Component Value Date   INR 1.18 08/31/2012    Assessment/Plan: 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 We have discussed the various treatment options including surgery. I have described the surgical treatment option of the left L4-5 laminectomy for removal of the synovial cyst.  I have shown her surgical models. We have discussed the risks, benefits, alternatives, expected postoperative course, and likelihood  of achieving her goals with surgery. I have answered all the patient's questions. She has decided to proceed with surgery.   Maxi Carreras D 11/13/2016 8:54 AM

## 2016-11-13 NOTE — Telephone Encounter (Signed)
As of now, PA denied. Needs to try and fail 3 of the listed meds below.

## 2016-11-13 NOTE — Transfer of Care (Signed)
Immediate Anesthesia Transfer of Care Note  Patient: Amy Barnes  Procedure(s) Performed: Procedure(s): LAMINOTOMY/LAMINECTOMY LUMBAR FOUR LUMBAR FIVE  WITH RESECTION OF SYNOVIAL CYST (N/A)  Patient Location: PACU  Anesthesia Type:General  Level of Consciousness: awake, alert  and oriented  Airway & Oxygen Therapy: Patient Spontanous Breathing and Patient connected to nasal cannula oxygen  Post-op Assessment: Report given to RN, Post -op Vital signs reviewed and stable and Patient moving all extremities X 4  Post vital signs: Reviewed and stable  Last Vitals:  Vitals:   11/13/16 0715 11/13/16 1141  BP: (!) 131/49 123/66  Pulse: 86 99  Resp: 20 19  Temp: 36.5 C 36.4 C    Last Pain:  Vitals:   11/13/16 1141  TempSrc:   PainSc: (P) 7       Patients Stated Pain Goal: 3 (11/13/16 0706)  Complications: No apparent anesthesia complications

## 2016-11-13 NOTE — Anesthesia Preprocedure Evaluation (Addendum)
Anesthesia Evaluation  Patient identified by MRN, date of birth, ID band Patient awake    Reviewed: Allergy & Precautions, NPO status , Patient's Chart, lab work & pertinent test results  Airway Mallampati: I  TM Distance: >3 FB Neck ROM: Full    Dental  (+) Teeth Intact   Pulmonary sleep apnea , former smoker,    Pulmonary exam normal        Cardiovascular hypertension, Normal cardiovascular exam     Neuro/Psych Anxiety Bipolar Disorder    GI/Hepatic GERD  Medicated and Controlled,  Endo/Other  diabetes, Type 2, Oral Hypoglycemic Agents  Renal/GU      Musculoskeletal   Abdominal   Peds  Hematology   Anesthesia Other Findings   Reproductive/Obstetrics                            Anesthesia Physical Anesthesia Plan  ASA: III  Anesthesia Plan: General   Post-op Pain Management:    Induction: Intravenous  Airway Management Planned: Oral ETT  Additional Equipment:   Intra-op Plan:   Post-operative Plan: Extubation in OR  Informed Consent: I have reviewed the patients History and Physical, chart, labs and discussed the procedure including the risks, benefits and alternatives for the proposed anesthesia with the patient or authorized representative who has indicated his/her understanding and acceptance.     Plan Discussed with: CRNA and Surgeon  Anesthesia Plan Comments:         Anesthesia Quick Evaluation

## 2016-11-13 NOTE — Progress Notes (Signed)
ANTIBIOTIC CONSULT NOTE - INITIAL  Pharmacy Consult for Vancomycin Indication: surgical prophylaxis  Allergies  Allergen Reactions  . Cephalexin Hives  . Hydrocodone Other (See Comments)    Reaction:  Hallucinations   . Risperidone And Related Other (See Comments)    Reaction:  Made pt excessively sleepy  . Seroquel [Quetiapine Fumarate] Other (See Comments)    Reaction:  Made pt excessively sleepy  . Sulfa Antibiotics Rash    Patient Measurements: Height: 5\' 4"  (162.6 cm) Weight: (!) 359 lb (162.8 kg) IBW/kg (Calculated) : 54.7  Labs:  Recent Labs  11/13/16 0654  WBC 6.0  HGB 12.9  PLT 232  CREATININE 0.84   Estimated Creatinine Clearance: 104.6 mL/min (by C-G formula based on SCr of 0.84 mg/dL).  Microbiology: Recent Results (from the past 720 hour(s))  Surgical pcr screen     Status: None   Collection Time: 11/13/16  6:41 AM  Result Value Ref Range Status   MRSA, PCR NEGATIVE NEGATIVE Final   Staphylococcus aureus NEGATIVE NEGATIVE Final    Comment:        The Xpert SA Assay (FDA approved for NASAL specimens in patients over 10 years of age), is one component of a comprehensive surveillance program.  Test performance has been validated by [facility] for patients greater than or equal to 70 year old. It is not intended to diagnose infection nor to guide or monitor treatment.     Medical History: Past Medical History:  Diagnosis Date  . Allergic rhinitis   . Anemia   . Anxiety   . Asthma    seasonal  . Bipolar affective disorder (HCC)    takes Synthroid meds for Bipolar  . CAD (coronary artery disease) 07/2016   by CT scan  . Carpal tunnel syndrome   . Cataract   . Centrilobular emphysema (HCC) 07/2016   by CT scan - pt not aware of this  . Constipation due to pain medication   . Depression with anxiety   . Diabetes mellitus   . GERD (gastroesophageal reflux disease)   . 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 Hypertension   . OSA (obstructive sleep apnea)    no longer using cpap, uses a bed that raises and lowers hob  . Osteoarthritis   . Osteoarthritis   . Pneumonia   . Restless legs   . Septic arthritis (HCC) 10/11/2012  . Shortness of breath   . Status post revision of total hip replacement bilateral   prosthetic infection R 2013, L 2015  . Thoracic aortic atherosclerosis (HCC) 11/207   by CT   Assessment:  66 yr old female s/p lumbar surgery for Vancomycin x 1 dose post-op.  Cephalexin allergy.  Vanc 1500 mg IV given pre-op at ~9am.  No drain.  Goal of Therapy:  Vancomycin trough level 10-15 mcg/ml  Plan:   Vancomycin 1500 mg IV x 1 at 9pm tonight.  Pharmacy to sign off.  Dennie Fetters, RPh Pager: [phone] 11/13/2016,2:52 PM

## 2016-11-13 NOTE — Op Note (Signed)
Brief history: The patient is a 66 year old Amy Barnes female on whom I performed a L3-4 fusion many years ago. The patient has developed left leg pain consistent with a lumbar radiculopathy. She has failed medical management and was worked up with a lumbar MRI. 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 I discussed situation with the patient. We discussed the various treatment options including surgery. She has weighed the risks, benefits, and alternatives to surgery and decided proceed with a left L4-5 laminectomy for resection of synovial cyst.  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 Delma Officer  Asst.: Marikay Alar  Anesthesia: Gen. endotracheal  Estimated blood loss: 100 mL  Drains: None  Complications: None  Description of procedure: The patient was brought to the operating room by the anesthesia team. General endotracheal anesthesia was induced. The patient was turned to the prone position on the Wilson frame. The patient's lumbosacral region was then prepared with Betadine scrub and Betadine solution. Sterile drapes were applied.  I then injected the area to be incised with Marcaine with epinephrine solution. I then used a scalpel to make a linear midline incision over the L4-5 intervertebral disc space incising through the old surgical scar. I then used electrocautery to perform a left sided subperiosteal dissection exposing the spinous process and lamina of L4 and L5. We obtained intraoperative radiograph to confirm our location. I then inserted the [facility] retractor for exposure.  We then brought the operative microscope into the field. Under its magnification and illumination we completed the microdissection. I used a high-speed drill to perform a laminotomy  at L4-5 on the left. I then used a Kerrison punches to widen the laminotomy and removed the ligamentum flavum at L4-5. We encountered a synovial cyst as expected. It was somewhat adherent to the dura.. We then used microdissection to free up the synovial cyst from the thecal sac. We removed the synovial cyst and multiple fragments using the Kerrison punches and pituitary forceps decompressing the left L4 and L5 nerve roots I then used a Kerrison punch to perform a foraminotomy at about the left L4 and left L5 nerve root. We inspected the intervertebral disc at L4-5 on the left. No herniations. I then palpated along the ventral surface of the thecal sac and along exit route of the L4 and L5 nerve root and noted that the neural structures were well decompressed. This completed the decompression.  We then obtained hemostasis using bipolar electrocautery. We irrigated the wound out with bacitracin solution. We then removed the retractor. We then reapproximated the patient's thoracolumbar fascia with interrupted #1 Vicryl suture. We then reapproximated the patient's subcutaneous tissue with interrupted 2-0 Vicryl suture. We then reapproximated patient's skin with Steri-Strips and benzoin. The was then coated with bacitracin ointment. The drapes were removed. The patient was subsequently returned to the supine position where they were extubated by the anesthesia team. The patient was then transported to the postanesthesia care unit in stable condition. All sponge instrument and needle counts were reportedly correct at the end of this case.

## 2016-11-13 NOTE — Progress Notes (Signed)
Patient ID: Amy Barnes, female   DOB: Feb 01, 1951, 66 y.o.   MRN: [id number] Subjective:  The patient is alert and pleasant. She looks well.  Objective: Vital signs in last 24 hours: Temp:  [97.6 F (36.4 C)-97.7 F (36.5 C)] 97.6 F (36.4 C) (02/15 1141) Pulse Rate:  [86-99] 97 (02/15 1210) Resp:  [16-20] 19 (02/15 1210) BP: (123-133)/(41-66) 133/63 (02/15 1210) SpO2:  [96 %-100 %] 99 % (02/15 1210) Weight:  [162.8 kg (359 lb)] 162.8 kg (359 lb) (02/15 0838)  Intake/Output from previous day: No intake/output data recorded. Intake/Output this shift: Total I/O In: 1240 [P.O.:240; I.V.:1000] Out: 100 [Blood:100]  Physical exam the patient is alert and pleasant. She is moving her lower extremities well.  Lab Results:  Recent Labs  11/13/16 0654  WBC 6.0  HGB 12.9  HCT 39.4  PLT 232   BMET  Recent Labs  11/13/16 0654  NA 139  K 3.6  CL 99*  CO2 27  GLUCOSE 109*  BUN 21*  CREATININE 0.84  CALCIUM 9.7    Studies/Results: Dg Lumbar Spine 1 View  Result Date: 11/13/2016 CLINICAL DATA:  Elective surgery EXAM: LUMBAR SPINE - 1 VIEW COMPARISON:  MRI 10/17/2016 FINDINGS: Cross-table lateral view of the cervical spine demonstrates posterior surgical instruments overlying the hardware at the L3-4 level. IMPRESSION: Intraoperative localization as above. Electronically Signed   By: Charlett Nose M.D.   On: 11/13/2016 11:35    Assessment/Plan: The patient is doing well. I spoke with her husband and daughter.  LOS: 0 days     Polina Burmaster D 11/13/2016, 12:25 PM

## 2016-11-14 ENCOUNTER — Encounter (HOSPITAL_COMMUNITY): Payer: Self-pay | Admitting: Neurosurgery

## 2016-11-14 LAB — GLUCOSE, CAPILLARY: Glucose-Capillary: 152 mg/dL — ABNORMAL HIGH (ref 65–99)

## 2016-11-14 MED ORDER — CYCLOBENZAPRINE HCL 10 MG PO TABS: 10.0000 mg | ORAL_TABLET | Freq: Three times a day (TID) | ORAL | Status: DC | PRN

## 2016-11-14 MED ORDER — CYCLOBENZAPRINE HCL 10 MG PO TABS: 10.0000 mg | ORAL_TABLET | Freq: Three times a day (TID) | ORAL | 1 refills | Status: DC | PRN

## 2016-11-14 MED ORDER — HYDROMORPHONE HCL 4 MG PO TABS: 4.0000 mg | ORAL_TABLET | ORAL | 0 refills | Status: DC | PRN

## 2016-11-14 MED ORDER — DOCUSATE SODIUM 100 MG PO CAPS: 100.0000 mg | ORAL_CAPSULE | Freq: Two times a day (BID) | ORAL | 0 refills | Status: DC

## 2016-11-14 MED ORDER — METHOCARBAMOL 500 MG PO TABS
500.0000 mg | ORAL_TABLET | Freq: Four times a day (QID) | ORAL | Status: DC | PRN
Start: 2016-11-14 — End: 2016-11-14
  Administered 2016-11-14 (×2): 500 mg via ORAL
  Filled 2016-11-14 (×2): qty 1

## 2016-11-14 NOTE — Progress Notes (Signed)
Pt doing well. Pt and family given D/C instructions with Rx's, verbal understanding was provided. Pt's incision is clean and dry with no sign of infection. Pt D/C'd home via wheelchair @ 1155 per MD order. Pt is stable @ D/C and has no other needs at this time. Rema Fendt, RN

## 2016-11-14 NOTE — Discharge Instructions (Signed)
Call MD for: Difficulty breathing, headache or visual disturbances, extreme fatigue  Call MD for: hives  Call MD for: persistant dizziness or light-headedness  Call MD for: persistant nausea and vomiting  Call MD for: redness, tenderness, or signs of infection (pain, swelling, redness, odor or green/yellow discharge around incision site)  Call MD for: severe uncontrolled pain  Call MD for: temperature >100.4 Diet - low sodium heart healthy Discharge instructions   Call [phone] for a followup appointment.   Increase activity slowly  Remove dressing on Sunday

## 2016-11-14 NOTE — Discharge Summary (Signed)
Physician Discharge Summary  Patient ID: Amy Barnes MRN: [id number] DOB/AGE: 65-12-1950 65 y.o.  Admit date: 11/13/2016 Discharge date: 11/14/2016  Admission Diagnoses:L4-5 synovial cyst, lumbago, lumbar radiculopathy  Discharge Diagnoses: The same Active Problems:   Synovial cyst of lumbar facet joint   Discharged Condition: good  Hospital Course: I performed a left L4-5 laminectomy for resection of synovial cyst on 11/13/2016. The surgery went well.  The patient's postoperative course was unremarkable. On postoperative day #1 she requested discharge to home. The patient, and her daughter, were given written and oral discharge instructions. All their questions were answered.  Consults: Physical therapy Significant Diagnostic Studies: None Treatments: Left L4-5 laminectomy for resection of synovial cyst using microdissection. Discharge Exam: Blood pressure 132/84, pulse (!) 116, temperature 98.5 F (36.9 C), temperature source Oral, resp. rate (!) 22, height 5\' 4"  (1.626 m), weight (!) 162.8 kg (359 lb), SpO2 97 %. The patient is alert and pleasant. She looks well. Her strength is normal in her lower extremities.  Disposition: Home  Discharge Instructions    Call MD for:  difficulty breathing, headache or visual disturbances    Complete by:  As directed    Call MD for:  extreme fatigue    Complete by:  As directed    Call MD for:  hives    Complete by:  As directed    Call MD for:  persistant dizziness or light-headedness    Complete by:  As directed    Call MD for:  persistant nausea and vomiting    Complete by:  As directed    Call MD for:  redness, tenderness, or signs of infection (pain, swelling, redness, odor or green/yellow discharge around incision site)    Complete by:  As directed    Call MD for:  severe uncontrolled pain    Complete by:  As directed    Call MD for:  temperature >100.4    Complete by:  As directed    Diet - low sodium heart healthy     Complete by:  As directed    Discharge instructions    Complete by:  As directed    Call [phone] for a followup appointment. Take a stool softener while you are using pain medications.   Driving Restrictions    Complete by:  As directed    Do not drive for 2 weeks.   Increase activity slowly    Complete by:  As directed    Lifting restrictions    Complete by:  As directed    Do not lift more than 5 pounds. No excessive bending or twisting.   May shower / Bathe    Complete by:  As directed    He may shower after the pain she is removed 3 days after surgery. Leave the incision alone.   Remove dressing in 48 hours    Complete by:  As directed    Your stitches are under the scan and will dissolve by themselves. The Steri-Strips will fall off after you take a few showers. Do not rub back or pick at the wound, Leave the wound alone.     Allergies as of 11/14/2016      Reactions   Cephalexin Hives   Hydrocodone Other (See Comments)   Reaction:  Hallucinations    Risperidone And Related Other (See Comments)   Reaction:  Made pt excessively sleepy   Seroquel [quetiapine Fumarate] Other (See Comments)   Reaction:  Made pt excessively sleepy  Sulfa Antibiotics Rash      Medication List    STOP taking these medications   oxyCODONE-acetaminophen 5-325 MG tablet Commonly known as:  PERCOCET/ROXICET     TAKE these medications   albuterol 108 (90 Base) MCG/ACT inhaler Commonly known as:  PROVENTIL HFA;VENTOLIN HFA Inhale 2 puffs into the lungs every 6 (six) hours as needed for wheezing or shortness of breath. Reported on 04/17/2016   aspirin 81 MG EC tablet Take 81 mg by mouth at bedtime.   CALCIUM 600+D 600-400 MG-UNIT tablet Generic drug:  Calcium Carbonate-Vitamin D Take 1 tablet by mouth daily.   celecoxib 200 MG capsule Commonly known as:  CELEBREX TAKE 1 CAPSULE (200 MG TOTAL) BY MOUTH 2 (TWO) TIMES DAILY.   cyclobenzaprine 10 MG tablet Commonly known as:   FLEXERIL Take 1 tablet (10 mg total) by mouth 3 (three) times daily as needed for muscle spasms.   docusate sodium 100 MG capsule Commonly known as:  COLACE Take 1 capsule (100 mg total) by mouth 2 (two) times daily.   doxycycline 100 MG tablet Commonly known as:  VIBRA-TABS Take 100 mg by mouth at bedtime. Reported on 12/31/2015   fluconazole 150 MG tablet Commonly known as:  DIFLUCAN Take 150 mg by mouth See admin instructions. As needed for yeast infection take 150mg  once then repeat in 72 hours.   Fluticasone-Salmeterol 250-50 MCG/DOSE Aepb Commonly known as:  ADVAIR Inhale 1 puff into the lungs 2 (two) times daily.   furosemide 40 MG tablet Commonly known as:  LASIX TAKE 1 TABLET BY MOUTH 2 TIMES DAILY.   HYDROmorphone 4 MG tablet Commonly known as:  DILAUDID Take 1 tablet (4 mg total) by mouth every 4 (four) hours as needed for severe pain. What changed:  medication strength  how much to take  when to take this   lamoTRIgine 200 MG tablet Commonly known as:  LAMICTAL Take 200 mg by mouth daily.   levothyroxine 150 MCG tablet Commonly known as:  SYNTHROID, LEVOTHROID Take 150 mcg by mouth at bedtime.   linaclotide 145 MCG Caps capsule Commonly known as:  LINZESS Take 1 capsule (145 mcg total) by mouth daily.   losartan 50 MG tablet Commonly known as:  COZAAR Take 1 tablet (50 mg total) by mouth daily.   metFORMIN 500 MG 24 hr tablet Commonly known as:  GLUCOPHAGE-XR Take 2 tablets (1,000 mg total) by mouth daily with breakfast.   montelukast 10 MG tablet Commonly known as:  SINGULAIR Take 1 tablet (10 mg total) by mouth at bedtime.   multivitamin with minerals Tabs tablet Take 2 tablets by mouth daily.   ondansetron 8 MG tablet Commonly known as:  ZOFRAN Take 8 mg by mouth every 8 (eight) hours as needed for nausea or vomiting.   Oxcarbazepine 300 MG tablet Commonly known as:  TRILEPTAL Take 300-600 mg by mouth 2 (two) times daily. Pt takes one  tablet in the morning and two in the evening.   pantoprazole 40 MG tablet Commonly known as:  PROTONIX TAKE 1 TABLET (40 MG TOTAL) BY MOUTH DAILY.   Pen Needles 32G X 4 MM Misc 1.8 mg by Does not apply route daily. Use pen needle to adminster Victoza once daily and as directed. Dx E11.42   polyethylene glycol packet Commonly known as:  MIRALAX / GLYCOLAX Take 17 g by mouth daily as needed for mild constipation.   promethazine 25 MG tablet Commonly known as:  PHENERGAN Take 25 mg by mouth every  6 (six) hours as needed for nausea or vomiting.   simvastatin 40 MG tablet Commonly known as:  ZOCOR TAKE 1 TABLET (40 MG TOTAL) BY MOUTH EVERY EVENING.   VICTOZA 18 MG/3ML Sopn Generic drug:  liraglutide Inject 1.8 mg into the skin at bedtime.   XIIDRA 5 % Soln Generic drug:  Lifitegrast Place 1 drop into both eyes 2 (two) times daily.   zaleplon 10 MG capsule Commonly known as:  SONATA Take 10 mg by mouth at bedtime as needed for sleep. Reported on 04/17/2016        Signed: Cristi Loron 11/14/2016, 7:49 AM

## 2016-11-14 NOTE — Evaluation (Signed)
Physical Therapy Evaluation Patient Details Name: REGAN MCBRYAR MRN: [id number] DOB: 06/17/1951 Today's Date: 11/14/2016   History of Present Illness  Pt is 66 y/o female s/p L4-5 laminectomy for resection of synovial cyst. Pt has a PMH of CAD, DM, HTN, osteoarthritis, and R THR.   Clinical Impression  Pt admitted secondary to procedure mentioned above and presents with functional limitations below. Prior to admission, pt was using cane for functional mobility, but was independent with functional tasks. Upon evaluation, pt required supervision to min A for functional tasks. Pt experienced dizziness and fatigue during gait training and required min guard assist at times for safety. Pt required standing rest break for resolution of symptoms. Pt demonstrating grogginess during session, however, pt family available for precaution education. Recommending d/c home with assist level mentioned below. Pt will not need any follow up PT. Will continue to follow to maximize functional mobility independence.     Follow Up Recommendations No PT follow up, Supervision Assist 24/7    Equipment Recommendations  None recommended by PT    Recommendations for Other Services       Precautions / Restrictions Precautions Precautions: Back Precaution Booklet Issued: Yes (comment) Precaution Comments: Reviewed with pt and daughter as pt reports severe grogginess due to lack of sleep last night.  Required Braces or Orthoses:  (None) Restrictions Weight Bearing Restrictions: No      Mobility  Bed Mobility Overal bed mobility: Needs Assistance Bed Mobility: Sidelying to Sit   Sidelying to sit: Min assist       General bed mobility comments: Min A provided for maintenance of log roll precautions. Required assist with LE mangement. Daughter educated about appropriate assist level required.   Transfers Overall transfer level: Needs assistance Equipment used: Straight cane Transfers: Sit to/from Stand Sit  to Stand: Supervision         General transfer comment: Supervision for safety.   Ambulation/Gait Ambulation/Gait assistance: Min guard;Supervision Ambulation Distance (Feet): 150 Feet Assistive device: Straight cane Gait Pattern/deviations: Step-through pattern;Decreased step length - right;Decreased step length - left;Decreased stride length;Trunk flexed;Wide base of support Gait velocity: Decreased Gait velocity interpretation: Below normal speed for age/gender General Gait Details: Pt demonstrated generalized weakness during gait  consistent with prior level of function which caused gait deficits above. Pt required multiple rest breaks during gait training secondary to reports of weakness and dizziness. Symptoms resolved with standing rest break.   Stairs Stairs:  (ramp to enter)          Wheelchair Mobility    Modified Rankin (Stroke Patients Only)       Balance Overall balance assessment: Needs assistance Sitting-balance support: Feet supported;Bilateral upper extremity supported Sitting balance-Leahy Scale: Poor Sitting balance - Comments: Pt experienced pain in sitting which required use of BUE for support.   Standing balance support: Single extremity supported Standing balance-Leahy Scale: Fair Standing balance comment: static                             Pertinent Vitals/Pain Pain Assessment: Faces Faces Pain Scale: Hurts whole lot Pain Location: Surgical site Pain Descriptors / Indicators: Discomfort;Operative site guarding;Sore Pain Intervention(s): Limited activity within patient's tolerance;Monitored during session;Patient requesting pain meds-RN notified;Repositioned    Home Living Family/patient expects to be discharged to:: Private residence Living Arrangements: Spouse/significant other;Children Available Help at Discharge: Family;Available 24 hours/day Type of Home: House Home Access: Ramped entrance     Home Layout: Two level (Pt  stays on first floor. ) Home Equipment: Dan Humphreys - 2 wheels;Walker - 4 wheels;Shower seat;Bedside commode;Cane - single point;Grab bars - toilet;Grab bars - tub/shower;Hand held shower head;Adaptive equipment      Prior Function Level of Independence: Independent         Comments: Pt required use of SPC prior to admission     Hand Dominance        Extremity/Trunk Assessment   Upper Extremity Assessment Upper Extremity Assessment: Overall WFL for tasks assessed    Lower Extremity Assessment Lower Extremity Assessment: Generalized weakness (Weakness consistent with prior diagnosis)       Communication   Communication: No difficulties  Cognition Arousal/Alertness: Awake/alert (Pt reports grogginess due to lack of sleep) Behavior During Therapy: WFL for tasks assessed/performed Overall Cognitive Status: Within Functional Limits for tasks assessed                      General Comments General comments (skin integrity, edema, etc.): Education about appropriate walking exercise program provided. Pt's daughter reports pt is moving around better than previously. Extensive education provided to pt and daughter about appropriate method of getting into the car while maintaining back precautions.     Exercises     Assessment/Plan    PT Assessment Patient needs continued PT services  PT Problem List Decreased strength;Decreased activity tolerance;Decreased balance;Decreased mobility;Decreased knowledge of precautions;Pain          PT Treatment Interventions Gait training;Stair training;Functional mobility training;DME instruction;Therapeutic activities;Therapeutic exercise;Neuromuscular re-education;Patient/family education    PT Goals (Current goals can be found in the Care Plan section)  Acute Rehab PT Goals Patient Stated Goal: to return home PT Goal Formulation: With patient/family Time For Goal Achievement: 11/21/16 Potential to Achieve Goals: Good    Frequency  Min 5X/week   Barriers to discharge        Co-evaluation               End of Session Equipment Utilized During Treatment: Gait belt Activity Tolerance: Patient limited by pain Patient left: in chair;with call bell/phone within reach;with family/visitor present Nurse Communication: Mobility status         Time: 6301-6010 PT Time Calculation (min) (ACUTE ONLY): 31 min   Charges:   PT Evaluation $PT Eval Moderate Complexity: 1 Procedure PT Treatments $Gait Training: 8-22 mins   PT G Codes:        Arna Snipe 11/14/2016, 9:56 AM  Margot Chimes, PT, DPT  Acute Rehabilitation Services  Pager: [phone]

## 2016-11-20 ENCOUNTER — Telehealth: Payer: Self-pay

## 2016-11-20 MED ORDER — MELOXICAM 15 MG PO TABS: 7.5000 mg | ORAL_TABLET | Freq: Every day | ORAL | 5 refills | Status: DC

## 2016-11-20 NOTE — Telephone Encounter (Signed)
PA has been completed and denied. Waiting for substitute med to be sent into pharmacy.

## 2016-11-20 NOTE — Telephone Encounter (Signed)
plz notify patient this was denied, needs to try 3 alternatives. will try meloxicam 15mg  sent to pharmacy to take 1/2-1 tab once daily as needed, try to use sparingly. Make sure to continue protonix for GI protection.  (Meloxicam, Naproxen, Ibuprofen-Tier 1; Etodolac-Tier 2; Diclofenac-Tier 4)

## 2016-11-20 NOTE — Telephone Encounter (Signed)
Anna at [facility] [employer] left v/m; that next time pt tries to fill Celebrex a prior Berkley Harvey will be needed. Celebrex is non formulary under part D benefit.

## 2016-11-21 NOTE — Telephone Encounter (Signed)
Attempted to call patient. Mailbox was full. Unable to leave message. Will try again later.

## 2016-11-25 ENCOUNTER — Encounter: Payer: Self-pay | Admitting: *Deleted

## 2016-11-25 NOTE — Telephone Encounter (Signed)
Attempted to contact patient. Mailbox was full. Unable to leave message. Letter mailed informing patient.

## 2016-11-29 ENCOUNTER — Other Ambulatory Visit: Payer: Self-pay | Admitting: Family Medicine

## 2016-12-09 ENCOUNTER — Encounter: Payer: Self-pay | Admitting: Family Medicine

## 2016-12-09 MED ORDER — LIRAGLUTIDE 18 MG/3ML ~~LOC~~ SOPN: 1.8000 mg | PEN_INJECTOR | Freq: Every day | SUBCUTANEOUS | 6 refills | Status: DC

## 2016-12-16 ENCOUNTER — Other Ambulatory Visit: Payer: Self-pay | Admitting: Family Medicine

## 2016-12-20 ENCOUNTER — Other Ambulatory Visit: Payer: Self-pay | Admitting: Family Medicine

## 2016-12-23 DIAGNOSIS — M545 Low back pain: Secondary | ICD-10-CM | POA: Diagnosis not present

## 2016-12-30 DIAGNOSIS — M545 Low back pain: Secondary | ICD-10-CM | POA: Diagnosis not present

## 2017-01-15 DIAGNOSIS — M545 Low back pain: Secondary | ICD-10-CM | POA: Diagnosis not present

## 2017-01-28 ENCOUNTER — Other Ambulatory Visit: Payer: Self-pay | Admitting: Family Medicine

## 2017-01-28 DIAGNOSIS — E1142 Type 2 diabetes mellitus with diabetic polyneuropathy: Secondary | ICD-10-CM

## 2017-01-28 DIAGNOSIS — E039 Hypothyroidism, unspecified: Secondary | ICD-10-CM

## 2017-01-28 DIAGNOSIS — E7800 Pure hypercholesterolemia, unspecified: Secondary | ICD-10-CM

## 2017-01-29 ENCOUNTER — Other Ambulatory Visit (INDEPENDENT_AMBULATORY_CARE_PROVIDER_SITE_OTHER): Payer: Medicare Other

## 2017-01-29 DIAGNOSIS — E1142 Type 2 diabetes mellitus with diabetic polyneuropathy: Secondary | ICD-10-CM | POA: Diagnosis not present

## 2017-01-29 DIAGNOSIS — E039 Hypothyroidism, unspecified: Secondary | ICD-10-CM | POA: Diagnosis not present

## 2017-01-29 DIAGNOSIS — E7800 Pure hypercholesterolemia, unspecified: Secondary | ICD-10-CM | POA: Diagnosis not present

## 2017-01-29 LAB — COMPREHENSIVE METABOLIC PANEL
ALT: 20 U/L (ref 0–35)
AST: 18 U/L (ref 0–37)
Albumin: 4.4 g/dL (ref 3.5–5.2)
Alkaline Phosphatase: 60 U/L (ref 39–117)
BUN: 14 mg/dL (ref 6–23)
CO2: 31 mEq/L (ref 19–32)
Calcium: 10.2 mg/dL (ref 8.4–10.5)
Chloride: 96 mEq/L (ref 96–112)
Creatinine, Ser: 0.63 mg/dL (ref 0.40–1.20)
GFR: 100.91 mL/min (ref >60.00)
Glucose, Bld: 112 mg/dL — ABNORMAL HIGH (ref 70–99)
Potassium: 4.3 mEq/L (ref 3.5–5.1)
Sodium: 133 mEq/L — ABNORMAL LOW (ref 135–145)
Total Bilirubin: 0.4 mg/dL (ref 0.2–1.2)
Total Protein: 7 g/dL (ref 6.0–8.3)

## 2017-01-29 LAB — LIPID PANEL
Cholesterol: 132 mg/dL (ref 0–200)
HDL: 60.3 mg/dL (ref >39.00)
LDL Cholesterol: 56 mg/dL (ref 0–99)
NonHDL: 71.26
Total CHOL/HDL Ratio: 2
Triglycerides: 78 mg/dL (ref 0.0–149.0)
VLDL: 15.6 mg/dL (ref 0.0–40.0)

## 2017-01-29 LAB — HEMOGLOBIN A1C: Hgb A1c MFr Bld: 5.8 % (ref 4.6–6.5)

## 2017-01-29 LAB — TSH: TSH: 1.24 u[IU]/mL (ref 0.35–4.50)

## 2017-02-03 ENCOUNTER — Ambulatory Visit (INDEPENDENT_AMBULATORY_CARE_PROVIDER_SITE_OTHER): Payer: Medicare Other | Admitting: Family Medicine

## 2017-02-03 ENCOUNTER — Encounter: Payer: Self-pay | Admitting: Family Medicine

## 2017-02-03 VITALS — BP 124/72 | HR 75 | Temp 97.5°F | Wt 334.5 lb

## 2017-02-03 DIAGNOSIS — Z6841 Body Mass Index (BMI) 40.0 and over, adult: Secondary | ICD-10-CM | POA: Diagnosis not present

## 2017-02-03 DIAGNOSIS — E1142 Type 2 diabetes mellitus with diabetic polyneuropathy: Secondary | ICD-10-CM | POA: Diagnosis not present

## 2017-02-03 DIAGNOSIS — R300 Dysuria: Secondary | ICD-10-CM | POA: Diagnosis not present

## 2017-02-03 DIAGNOSIS — I10 Essential (primary) hypertension: Secondary | ICD-10-CM

## 2017-02-03 DIAGNOSIS — E039 Hypothyroidism, unspecified: Secondary | ICD-10-CM

## 2017-02-03 LAB — POC URINALSYSI DIPSTICK (AUTOMATED)
Bilirubin, UA: NEGATIVE
Blood, UA: NEGATIVE
Glucose, UA: NEGATIVE
Ketones, UA: NEGATIVE
Leukocytes, UA: NEGATIVE
Nitrite, UA: NEGATIVE
Protein, UA: NEGATIVE
Spec Grav, UA: 1.015 (ref 1.010–1.025)
Urobilinogen, UA: 0.2 E.U./dL
pH, UA: 7 (ref 5.0–8.0)

## 2017-02-03 MED ORDER — LIRAGLUTIDE 18 MG/3ML ~~LOC~~ SOPN: 1.8000 mg | PEN_INJECTOR | Freq: Every day | SUBCUTANEOUS | 3 refills | Status: DC

## 2017-02-03 MED ORDER — LEVOTHYROXINE SODIUM 150 MCG PO TABS: 150.0000 ug | ORAL_TABLET | Freq: Every day | ORAL | 3 refills | Status: DC

## 2017-02-03 NOTE — Progress Notes (Signed)
BP 124/72   Pulse 75   Temp 97.5 F (36.4 C) (Oral)   Wt (!) 334 lb 8 oz (151.7 kg)   SpO2 95%   BMI 57.42 kg/m    CC: f/u visit Subjective:    Patient ID: Amy Barnes, female    DOB: 1951/01/31, 66 y.o.   MRN: [id number]  HPI: Amy Barnes is a 66 y.o. female presenting on 02/03/2017 for Follow-up   She states medicare did not cover welcome to medicare visit. I asked her to check with our front office about this.   DM - regularly does check sugars - wonderful control. Compliant with antihyperglycemic regimen which includes: metformin XR 1000mg  daily, victoza 1.8mg  daily. Denies low sugars or hypoglycemic symptoms. Denies paresthesias. Last diabetic eye exam 11/2015.  Pneumovax: 2013.  Prevnar: not due yet. She has started keto diet (20gm carb/day). 1400 cal/day diet.  Lab Results  Component Value Date   HGBA1C 5.8 01/29/2017   Diabetic Foot Exam - Simple   No data filed      Lumbar laminectomy by Dr Lovell Sheehan 10/2016 - back feels better.   Hypothyroidism - requests we take over thyroid medication as new psychiatrist will no longer prescribe.  Lab Results  Component Value Date   TSH 1.24 01/29/2017    Unsure which NSAID she is taking - will let us know. Not regularly taking lasix. Dysuria last night with frequency. No hematuria, fevers/chills, abd or flank pain.   Relevant past medical, surgical, family and social history reviewed and updated as indicated. Interim medical history since our last visit reviewed. Allergies and medications reviewed and updated. Outpatient Medications Prior to Visit  Medication Sig Dispense Refill  . albuterol (PROVENTIL HFA;VENTOLIN HFA) 108 (90 BASE) MCG/ACT inhaler Inhale 2 puffs into the lungs every 6 (six) hours as needed for wheezing or shortness of breath. Reported on 04/17/2016    . aspirin 81 MG EC tablet Take 81 mg by mouth at bedtime.     . Calcium Carbonate-Vitamin D (CALCIUM 600+D) 600-400 MG-UNIT tablet Take 1 tablet by mouth  daily.    . cyclobenzaprine (FLEXERIL) 10 MG tablet Take 1 tablet (10 mg total) by mouth 3 (three) times daily as needed for muscle spasms. 50 tablet 1  . doxycycline (VIBRA-TABS) 100 MG tablet Take 100 mg by mouth at bedtime. Reported on 12/31/2015    . fluconazole (DIFLUCAN) 150 MG tablet Take 150 mg by mouth See admin instructions. As needed for yeast infection take 150mg  once then repeat in 72 hours.    . Fluticasone-Salmeterol (ADVAIR) 250-50 MCG/DOSE AEPB Inhale 1 puff into the lungs 2 (two) times daily.    . Insulin Pen Needle (PEN NEEDLES) 32G X 4 MM MISC 1.8 mg by Does not apply route daily. Use pen needle to adminster Victoza once daily and as directed. Dx E11.42 100 each 1  . lamoTRIgine (LAMICTAL) 200 MG tablet Take 200 mg by mouth daily.      Marland Kitchen losartan (COZAAR) 50 MG tablet TAKE 1 TABLET (50 MG TOTAL) BY MOUTH DAILY. 90 tablet 3  . meloxicam (MOBIC) 15 MG tablet Take 0.5-1 tablets (7.5-15 mg total) by mouth daily. As needed 30 tablet 5  . montelukast (SINGULAIR) 10 MG tablet TAKE 1 TABLET (10 MG TOTAL) BY MOUTH AT BEDTIME. 90 tablet 3  . Multiple Vitamin (MULTIVITAMIN WITH MINERALS) TABS tablet Take 2 tablets by mouth daily.     . ondansetron (ZOFRAN) 8 MG tablet Take 8 mg by mouth every  8 (eight) hours as needed for nausea or vomiting.     . Oxcarbazepine (TRILEPTAL) 300 MG tablet Take 300-600 mg by mouth 2 (two) times daily. Pt takes one tablet in the morning and two in the evening.    . pantoprazole (PROTONIX) 40 MG tablet TAKE 1 TABLET (40 MG TOTAL) BY MOUTH DAILY. 90 tablet 3  . promethazine (PHENERGAN) 25 MG tablet Take 25 mg by mouth every 6 (six) hours as needed for nausea or vomiting.    . simvastatin (ZOCOR) 40 MG tablet TAKE 1 TABLET (40 MG TOTAL) BY MOUTH EVERY EVENING. 90 tablet 1  . zaleplon (SONATA) 10 MG capsule Take 10 mg by mouth at bedtime as needed for sleep. Reported on 04/17/2016    . docusate sodium (COLACE) 100 MG capsule Take 1 capsule (100 mg total) by mouth 2  (two) times daily. 60 capsule 0  . furosemide (LASIX) 40 MG tablet TAKE 1 TABLET BY MOUTH 2 TIMES DAILY. (Patient taking differently: take one daily as needed) 180 tablet 1  . HYDROmorphone (DILAUDID) 4 MG tablet Take 1 tablet (4 mg total) by mouth every 4 (four) hours as needed for severe pain. 40 tablet 0  . levothyroxine (SYNTHROID, LEVOTHROID) 150 MCG tablet Take 150 mcg by mouth at bedtime.     Marland Kitchen Lifitegrast (XIIDRA) 5 % SOLN Place 1 drop into both eyes 2 (two) times daily.    Marland Kitchen liraglutide (VICTOZA) 18 MG/3ML SOPN Inject 0.3 mLs (1.8 mg total) into the skin at bedtime. 15 mL 6  . metFORMIN (GLUCOPHAGE-XR) 500 MG 24 hr tablet TAKE 2 TABLETS (1,000 MG TOTAL) BY MOUTH DAILY WITH BREAKFAST. 180 tablet 2  . polyethylene glycol (MIRALAX / GLYCOLAX) packet Take 17 g by mouth daily as needed for mild constipation.     . celecoxib (CELEBREX) 200 MG capsule TAKE 1 CAPSULE (200 MG TOTAL) BY MOUTH 2 (TWO) TIMES DAILY. (Patient not taking: Reported on 02/03/2017) 180 capsule 3  . Linaclotide (LINZESS) 145 MCG CAPS capsule Take 1 capsule (145 mcg total) by mouth daily. 30 capsule 0   No facility-administered medications prior to visit.      Per HPI unless specifically indicated in ROS section below Review of Systems     Objective:    BP 124/72   Pulse 75   Temp 97.5 F (36.4 C) (Oral)   Wt (!) 334 lb 8 oz (151.7 kg)   SpO2 95%   BMI 57.42 kg/m   Wt Readings from Last 3 Encounters:  02/03/17 (!) 334 lb 8 oz (151.7 kg)  11/13/16 (!) 359 lb (162.8 kg)  10/20/16 (!) 374 lb 4 oz (169.8 kg)    Physical Exam  Constitutional: She appears well-developed and well-nourished. No distress.  HENT:  Mouth/Throat: Oropharynx is clear and moist. No oropharyngeal exudate.  Cardiovascular: Normal rate, regular rhythm, normal heart sounds and intact distal pulses.   No murmur heard. Pulmonary/Chest: Effort normal and breath sounds normal. No respiratory distress. She has no wheezes. She has no rales.    Musculoskeletal: She exhibits no edema.  Skin: Skin is warm and dry. No rash noted.  Psychiatric: She has a normal mood and affect.  Nursing note and vitals reviewed.  Results for orders placed or performed in visit on 01/29/17  Lipid panel  Result Value Ref Range   Cholesterol 132 0 - 200 mg/dL   Triglycerides 16.1 0.0 - 149.0 mg/dL   HDL 09.60 >45.40 mg/dL   VLDL 98.1 0.0 - 19.1 mg/dL  LDL Cholesterol 56 0 - 99 mg/dL   Total CHOL/HDL Ratio 2    NonHDL 71.26   Comprehensive metabolic panel  Result Value Ref Range   Sodium 133 (L) 135 - 145 mEq/L   Potassium 4.3 3.5 - 5.1 mEq/L   Chloride 96 96 - 112 mEq/L   CO2 31 19 - 32 mEq/L   Glucose, Bld 112 (H) 70 - 99 mg/dL   BUN 14 6 - 23 mg/dL   Creatinine, Ser 3.71 0.40 - 1.20 mg/dL   Total Bilirubin 0.4 0.2 - 1.2 mg/dL   Alkaline Phosphatase 60 39 - 117 U/L   AST 18 0 - 37 U/L   ALT 20 0 - 35 U/L   Total Protein 7.0 6.0 - 8.3 g/dL   Albumin 4.4 3.5 - 5.2 g/dL   Calcium 06.2 8.4 - 69.4 mg/dL   GFR 854.62 >70.35 mL/min  TSH  Result Value Ref Range   TSH 1.24 0.35 - 4.50 uIU/mL  Hemoglobin A1c  Result Value Ref Range   Hgb A1c MFr Bld 5.8 4.6 - 6.5 %      Assessment & Plan:   Problem List Items Addressed This Visit    BMI 50.0-59.9, adult (HCC)    50 lb weight loss over last 6 months, 40 lbs in last 3 months. Congratulated and reviewed healthy diet changes to date. Pt motivated to continue weight loss. Currently using ketogenic diet and 1400 cal/day diet.       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 Will decrease metformin XR to 500mg  daily x 2 wks and if remaining well controlled, will try off metformin.  victoza refilled.       Relevant Medications   liraglutide (VICTOZA) 18 MG/3ML SOPN   metFORMIN (GLUCOPHAGE-XR) 500 MG 24 hr tablet   Essential  hypertension    Chronic, stable. Continue current regimen.       Relevant Medications   furosemide (LASIX) 40 MG tablet   Hypothyroidism    Recent TSH WNL. Requests we continue filling thyroid medication. Refilled today.       Relevant Medications   levothyroxine (SYNTHROID, LEVOTHROID) 150 MCG tablet       Follow up plan: Return in about 3 months (around 05/06/2017) for follow up visit.  [name], MD

## 2017-02-03 NOTE — Assessment & Plan Note (Signed)
50 lb weight loss over last 6 months, 40 lbs in last 3 months. Congratulated and reviewed healthy diet changes to date. Pt motivated to continue weight loss. Currently using ketogenic diet and 1400 cal/day diet.

## 2017-02-03 NOTE — Addendum Note (Signed)
Addended by: Desmond Dike on: 02/03/2017 08:53 AM   Modules accepted: Orders

## 2017-02-03 NOTE — Progress Notes (Signed)
Pre visit review using our clinic review tool, if applicable. No additional management support is needed unless otherwise documented below in the visit note. 

## 2017-02-03 NOTE — Assessment & Plan Note (Signed)
Chronic, stable. Continue current regimen. 

## 2017-02-03 NOTE — Patient Instructions (Addendum)
Urinalysis today.  Congratulations on healthy diet and changes to date - with great weight loss noted! Keep up the good work Decrease metformin to 500mg  once daily for next several weeks, if doing well on this, may stop metformin. Victoza refilled.  Return in 3 months for follow up visit.

## 2017-02-03 NOTE — Assessment & Plan Note (Signed)
Recent TSH WNL. Requests we continue filling thyroid medication. Refilled today.

## 2017-02-03 NOTE — Assessment & Plan Note (Signed)
Chronic, marked improvement. Will decrease metformin XR to 500mg  daily x 2 wks and if remaining well controlled, will try off metformin.  victoza refilled.

## 2017-02-04 ENCOUNTER — Telehealth: Payer: Self-pay | Admitting: Family Medicine

## 2017-02-04 NOTE — Telephone Encounter (Signed)
Where are guarantor notes?

## 2017-02-04 NOTE — Telephone Encounter (Signed)
Spoke with pt re: account, see guarantor notes.  / lt

## 2017-02-11 NOTE — Telephone Encounter (Signed)
In the appointment desk, billing can see them.  Go to the interactive face sheet and there is a place to add account notes specifically related to billing.

## 2017-03-09 ENCOUNTER — Encounter: Payer: Self-pay | Admitting: Family Medicine

## 2017-03-12 MED ORDER — DOXYCYCLINE HYCLATE 100 MG PO TABS: 100.0000 mg | ORAL_TABLET | Freq: Every day | ORAL | 2 refills | Status: DC

## 2017-03-18 ENCOUNTER — Other Ambulatory Visit: Payer: Self-pay | Admitting: Family Medicine

## 2017-03-24 DIAGNOSIS — Z6841 Body Mass Index (BMI) 40.0 and over, adult: Secondary | ICD-10-CM | POA: Diagnosis not present

## 2017-03-24 DIAGNOSIS — I10 Essential (primary) hypertension: Secondary | ICD-10-CM | POA: Diagnosis not present

## 2017-03-24 DIAGNOSIS — M7138 Other bursal cyst, other site: Secondary | ICD-10-CM | POA: Diagnosis not present

## 2017-04-24 ENCOUNTER — Telehealth: Payer: Self-pay | Admitting: Family Medicine

## 2017-04-24 ENCOUNTER — Encounter: Payer: Self-pay | Admitting: Family Medicine

## 2017-04-24 ENCOUNTER — Ambulatory Visit (INDEPENDENT_AMBULATORY_CARE_PROVIDER_SITE_OTHER): Payer: Medicare Other | Admitting: Family Medicine

## 2017-04-24 DIAGNOSIS — J0100 Acute maxillary sinusitis, unspecified: Secondary | ICD-10-CM

## 2017-04-24 MED ORDER — AZITHROMYCIN 250 MG PO TABS: ORAL_TABLET | ORAL | 0 refills | Status: DC

## 2017-04-24 NOTE — Progress Notes (Signed)
Chronically on doxy, likely lifelong.  D/w pt.    Sx started about 8-9 days ago.  Initially with dizziness.  Then the next day with R ear pain.  Less dizziness but not fully resolved.  Some facial pain.  No cough.  Some post nasal gtt.  No fevers known but felt hot/sweats episodically.  No vomiting, no diarrhea.  ST.  No rash.   Sugar has been controlled recently.    Meds, vitals, and allergies reviewed.   ROS: Per HPI unless specifically indicated in ROS section   GEN: nad, alert and oriented HEENT: mucous membranes moist, tm w/o erythema, nasal exam w/o erythema, clear discharge noted,  OP with cobblestoning, max sinuses ttp B NECK: supple w/o LA CV: rrr.   PULM: ctab, no inc wob EXT: no edema SKIN: no acute rash

## 2017-04-24 NOTE — Patient Instructions (Signed)
Keep taking doxy, add on zithromax.   Rest and fluids, update [place] as needed.  Take care.  Glad to see you.

## 2017-04-24 NOTE — Telephone Encounter (Signed)
[facility] Day - Client TELEPHONE ADVICE RECORD [facility]  Patient Name: [name]  DOB: Aug 27, 1951    Initial Comment Caller states c/o dizziness, sore throat, swollen glands and earache.   Nurse Assessment  Nurse: Laural Benes, RN, Dondra Spry Date/Time Lamount Cohen Time): 04/24/2017 10:30:49 AM  Confirm and document reason for call. If symptomatic, describe symptoms. ---Amy Barnes is dizzy with ear pain bilateral swollen glands with sore throat; getting hot flashes off and on no fever she is aware onset Thursday  Does the patient have any new or worsening symptoms? ---Yes  Will a triage be completed? ---Yes  Related visit to physician within the last 2 weeks? ---N/A  Does the PT have any chronic conditions? (i.e. diabetes, asthma, etc.) ---Unknown  Is this a behavioral health or substance abuse call? ---No     Guidelines    Guideline Title Affirmed Question Affirmed Notes  Dizziness - Lightheadedness [1] MODERATE dizziness (e.g., interferes with normal activities) AND [2] has NOT been evaluated by physician for this (Exception: dizziness caused by heat exposure, sudden standing, or poor fluid intake)    Final Disposition User   See Physician within 24 Hours Laural Benes, RN, Dondra Spry    Comments  NOTE; Appointment for 04/25/2017 1130am with Dr. Para March for c/o earpain, dizziness, headache, sore throat and swollen glands. NO AVAILABLE APPTS with PCP   Referrals  REFERRED TO PCP OFFICE   Disagree/Comply: Comply

## 2017-04-24 NOTE — Telephone Encounter (Signed)
Pt has appt 04/24/17 at 11:30 with Dr Para March.

## 2017-04-26 NOTE — Assessment & Plan Note (Signed)
Symptoms for greater than 1 week, maxillary sinuses tender. Nontoxic. Okay for outpatient follow-up. Multiple med intolerances noted, is already treated with doxycycline. Start Zithromax. See after visit summary. She agrees.

## 2017-05-10 ENCOUNTER — Other Ambulatory Visit: Payer: Self-pay | Admitting: Family Medicine

## 2017-05-13 ENCOUNTER — Encounter: Payer: Self-pay | Admitting: Family Medicine

## 2017-05-16 ENCOUNTER — Other Ambulatory Visit: Payer: Self-pay | Admitting: Family Medicine

## 2017-06-05 ENCOUNTER — Other Ambulatory Visit: Payer: Self-pay | Admitting: Family Medicine

## 2017-06-14 ENCOUNTER — Other Ambulatory Visit: Payer: Self-pay | Admitting: Family Medicine

## 2017-06-14 DIAGNOSIS — E1142 Type 2 diabetes mellitus with diabetic polyneuropathy: Secondary | ICD-10-CM

## 2017-06-18 ENCOUNTER — Other Ambulatory Visit (INDEPENDENT_AMBULATORY_CARE_PROVIDER_SITE_OTHER): Payer: Medicare Other

## 2017-06-18 DIAGNOSIS — E1142 Type 2 diabetes mellitus with diabetic polyneuropathy: Secondary | ICD-10-CM | POA: Diagnosis not present

## 2017-06-18 LAB — HEMOGLOBIN A1C: Hgb A1c MFr Bld: 5.3 % (ref 4.6–6.5)

## 2017-06-22 ENCOUNTER — Ambulatory Visit: Payer: Medicare Other | Admitting: Family Medicine

## 2017-06-25 ENCOUNTER — Ambulatory Visit (INDEPENDENT_AMBULATORY_CARE_PROVIDER_SITE_OTHER): Payer: Medicare Other | Admitting: Family Medicine

## 2017-06-25 ENCOUNTER — Encounter: Payer: Self-pay | Admitting: Family Medicine

## 2017-06-25 VITALS — BP 138/82 | HR 65 | Temp 98.1°F | Wt 294.2 lb

## 2017-06-25 DIAGNOSIS — Z7189 Other specified counseling: Secondary | ICD-10-CM | POA: Diagnosis not present

## 2017-06-25 DIAGNOSIS — Z6841 Body Mass Index (BMI) 40.0 and over, adult: Secondary | ICD-10-CM

## 2017-06-25 DIAGNOSIS — I10 Essential (primary) hypertension: Secondary | ICD-10-CM | POA: Diagnosis not present

## 2017-06-25 DIAGNOSIS — E1142 Type 2 diabetes mellitus with diabetic polyneuropathy: Secondary | ICD-10-CM

## 2017-06-25 DIAGNOSIS — M160 Bilateral primary osteoarthritis of hip: Secondary | ICD-10-CM

## 2017-06-25 DIAGNOSIS — Z23 Encounter for immunization: Secondary | ICD-10-CM | POA: Diagnosis not present

## 2017-06-25 NOTE — Progress Notes (Signed)
BP 138/82 (BP Location: Left Wrist, Patient Position: Sitting, Cuff Size: Normal)   Pulse 65   Temp 98.1 F (36.7 C) (Oral)   Wt 294 lb 4 oz (133.5 kg)   SpO2 98%   BMI 50.51 kg/m    CC: DM f/u visit Subjective:    Patient ID: Amy Barnes, female    DOB: 04-08-1951, 66 y.o.   MRN: [id number]  HPI: Amy Barnes is a 66 y.o. female presenting on 06/25/2017 for DM follow-up   Brother currently in hospice.   Asks for handicap placard application form. She has permanent disability plate.  Brings advanced directive. This was reviewed and discussed.  Obesity - marked weight loss 90 lbs over the past year (peak 384 lbs) with drastic lifestyle changes - currently on ketogenic diet (20gm carb/day and 1500 cal/day diet). She also has been increasing walking routine (5000 steps)  Rare lasix use.   DM - does regularly check sugars 95-120 fasting. Compliant with antihyperglycemic regimen which includes: victoza 1.8mg  daily. Metformin was tapered off over summer and she has still done well since weight loss over summer. Denies low sugars or hypoglycemic symptoms. Denies paresthesias. Last diabetic eye exam scheduled next week. Pneumovax: 08/2012. Prevnar: not due. Glucometer brand: unsure. DSME: not indicated at this time. Lab Results  Component Value Date   HGBA1C 5.3 06/18/2017   Diabetic Foot Exam - Simple   No data filed     Lab Results  Component Value Date   MICROALBUR 0.3 08/19/2010     Relevant past medical, surgical, family and social history reviewed and updated as indicated. Interim medical history since our last visit reviewed. Allergies and medications reviewed and updated. Outpatient Medications Prior to Visit  Medication Sig Dispense Refill  . albuterol (PROVENTIL HFA;VENTOLIN HFA) 108 (90 BASE) MCG/ACT inhaler Inhale 2 puffs into the lungs every 6 (six) hours as needed for wheezing or shortness of breath. Reported on 04/17/2016    . aspirin 81 MG EC tablet Take 81 mg  by mouth at bedtime.     . BD PEN NEEDLE NANO U/F 32G X 4 MM MISC USE PEN NEEDLE TO ADMINISTER VICTOZA ONCE DAILY AND AS DIRECTED. DX. E11.42 100 each 1  . cyclobenzaprine (FLEXERIL) 10 MG tablet Take 1 tablet (10 mg total) by mouth 3 (three) times daily as needed for muscle spasms. 50 tablet 1  . doxycycline (VIBRA-TABS) 100 MG tablet Take 1 tablet (100 mg total) by mouth at bedtime. Reported on 12/31/2015 90 tablet 2  . fluconazole (DIFLUCAN) 150 MG tablet Take 150 mg by mouth See admin instructions. As needed for yeast infection take 150mg  once then repeat in 72 hours.    . Fluticasone-Salmeterol (ADVAIR) 250-50 MCG/DOSE AEPB Inhale 1 puff into the lungs 2 (two) times daily.    . furosemide (LASIX) 40 MG tablet Take 1 tablet (40 mg total) by mouth daily as needed.    . lamoTRIgine (LAMICTAL) 200 MG tablet Take 200 mg by mouth daily.      Marland Kitchen levothyroxine (SYNTHROID, LEVOTHROID) 150 MCG tablet Take 1 tablet (150 mcg total) by mouth at bedtime. 90 tablet 3  . losartan (COZAAR) 50 MG tablet TAKE 1 TABLET (50 MG TOTAL) BY MOUTH DAILY. 90 tablet 3  . meloxicam (MOBIC) 15 MG tablet TAKE 0.5-1 TABLETS (7.5-15 MG TOTAL) BY MOUTH DAILY. AS NEEDED 30 tablet 5  . montelukast (SINGULAIR) 10 MG tablet TAKE 1 TABLET (10 MG TOTAL) BY MOUTH AT BEDTIME. 90 tablet 3  .  Multiple Vitamin (MULTIVITAMIN WITH MINERALS) TABS tablet Take 2 tablets by mouth daily.     . ondansetron (ZOFRAN) 8 MG tablet Take 8 mg by mouth every 8 (eight) hours as needed for nausea or vomiting.     . Oxcarbazepine (TRILEPTAL) 300 MG tablet Take 300-600 mg by mouth 2 (two) times daily. Pt takes one tablet in the morning and two in the evening.    . pantoprazole (PROTONIX) 40 MG tablet TAKE 1 TABLET (40 MG TOTAL) BY MOUTH DAILY. 90 tablet 3  . promethazine (PHENERGAN) 25 MG tablet Take 25 mg by mouth every 6 (six) hours as needed for nausea or vomiting.    . simvastatin (ZOCOR) 40 MG tablet Take 1 tablet (40 mg total) by mouth every evening.  OFFICE VISIT WITH LABS REQUIRED FOR ADDITIONAL REFILLS 90 tablet 0  . VICTOZA 18 MG/3ML SOPN INJECT 0.3 MLS (1.8 MG TOTAL) INTO THE SKIN AT BEDTIME. 27 mL 3  . zaleplon (SONATA) 10 MG capsule Take 10 mg by mouth at bedtime as needed for sleep. Reported on 04/17/2016    . azithromycin (ZITHROMAX) 250 MG tablet 2 tabs a day for 1 day and then 1 a day for 4 days. 6 each 0   No facility-administered medications prior to visit.      Per HPI unless specifically indicated in ROS section below Review of Systems     Objective:    BP 138/82 (BP Location: Left Wrist, Patient Position: Sitting, Cuff Size: Normal)   Pulse 65   Temp 98.1 F (36.7 C) (Oral)   Wt 294 lb 4 oz (133.5 kg)   SpO2 98%   BMI 50.51 kg/m   Wt Readings from Last 3 Encounters:  06/25/17 294 lb 4 oz (133.5 kg)  04/24/17 (!) 317 lb 8 oz (144 kg)  02/03/17 (!) 334 lb 8 oz (151.7 kg)    Physical Exam  Constitutional: She appears well-developed and well-nourished. No distress.  Psychiatric: She has a normal mood and affect.  Nursing note and vitals reviewed.  Results for orders placed or performed in visit on 06/18/17  Hemoglobin A1c  Result Value Ref Range   Hgb A1c MFr Bld 5.3 4.6 - 6.5 %   Lab Results  Component Value Date   TSH 1.24 01/29/2017       Assessment & Plan:   Problem List Items Addressed This Visit    Advanced care planning/counseling discussion    Advanced directive brought today, copied and scanned. Desires husband to be HCPOA. Desires very limited CPR attempts, does not want prolonged life support.       BMI 50.0-59.9, adult (HCC)    Congratulated on 90lb weight loss over last 11 months. Sustainable changes. She has increased walking - but with care given h/o OA.       Degenerative joint disease (DJD) of hip    Handicap placard application filled out.       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 Endorses home cbg readings  90-120s. Now off metformin. Desires to continue victoza for how. Reassess at 3 mo f/u visit.       Essential hypertension    Chronic, stable. Continue current regimen.           Follow up plan: Return in about 3 months (around 09/24/2017) for medicare wellness visit.  [name], MD

## 2017-06-25 NOTE — Assessment & Plan Note (Signed)
Congratulated on ongoing improvement. Endorses home cbg readings 90-120s. Now off metformin. Desires to continue victoza for how. Reassess at 3 mo f/u visit.

## 2017-06-25 NOTE — Addendum Note (Signed)
Addended by: Nanci Pina on: 06/25/2017 03:41 PM   Modules accepted: Orders

## 2017-06-25 NOTE — Assessment & Plan Note (Signed)
Advanced directive brought today, copied and scanned. Desires husband to be HCPOA. Desires very limited CPR attempts, does not want prolonged life support.

## 2017-06-25 NOTE — Assessment & Plan Note (Signed)
Handicap placard application filled out.  

## 2017-06-25 NOTE — Assessment & Plan Note (Signed)
Congratulated on 90lb weight loss over last 11 months. Sustainable changes. She has increased walking - but with care given h/o OA.

## 2017-06-25 NOTE — Patient Instructions (Addendum)
Congratulations on healthy changes and weight loss! Continue current medicines.  Return in 3 months for medicare wellness visit and labs.  Flu shot today.

## 2017-06-25 NOTE — Assessment & Plan Note (Signed)
Chronic, stable. Continue current regimen. 

## 2017-07-09 ENCOUNTER — Telehealth: Payer: Self-pay | Admitting: *Deleted

## 2017-07-09 DIAGNOSIS — Z87891 Personal history of nicotine dependence: Secondary | ICD-10-CM

## 2017-07-09 DIAGNOSIS — Z122 Encounter for screening for malignant neoplasm of respiratory organs: Secondary | ICD-10-CM

## 2017-07-09 NOTE — Telephone Encounter (Signed)
Notified patient that annual lung cancer screening low dose CT scan is due currently or will be in near future. Confirmed that patient is within the age range of 55-77, and asymptomatic, (no signs or symptoms of lung cancer). Patient denies illness that would prevent curative treatment for lung cancer if found. Verified smoking history, (former, quit 2010, 45 pack year). The shared decision making visit was done 07/15/17. Patient is agreeable for CT scan being scheduled.

## 2017-07-13 DIAGNOSIS — H353131 Nonexudative age-related macular degeneration, bilateral, early dry stage: Secondary | ICD-10-CM | POA: Diagnosis not present

## 2017-07-13 DIAGNOSIS — H5213 Myopia, bilateral: Secondary | ICD-10-CM | POA: Diagnosis not present

## 2017-07-13 DIAGNOSIS — Z9841 Cataract extraction status, right eye: Secondary | ICD-10-CM | POA: Diagnosis not present

## 2017-07-13 DIAGNOSIS — H04123 Dry eye syndrome of bilateral lacrimal glands: Secondary | ICD-10-CM | POA: Diagnosis not present

## 2017-07-13 DIAGNOSIS — Z9842 Cataract extraction status, left eye: Secondary | ICD-10-CM | POA: Diagnosis not present

## 2017-07-13 DIAGNOSIS — E119 Type 2 diabetes mellitus without complications: Secondary | ICD-10-CM | POA: Diagnosis not present

## 2017-07-13 LAB — HM DIABETES EYE EXAM

## 2017-07-20 ENCOUNTER — Ambulatory Visit: Admission: RE | Admit: 2017-07-20 | Payer: Medicare Other | Source: Ambulatory Visit

## 2017-07-31 ENCOUNTER — Ambulatory Visit
Admission: RE | Admit: 2017-07-31 | Discharge: 2017-07-31 | Disposition: A | Discharge status: Home / Self Care | Payer: Medicare Other | Source: Ambulatory Visit | Attending: Nurse Practitioner | Admitting: Nurse Practitioner

## 2017-07-31 ENCOUNTER — Encounter: Payer: Self-pay | Admitting: Family Medicine

## 2017-07-31 ENCOUNTER — Ambulatory Visit: Admission: RE | Admit: 2017-07-31 | Payer: Medicare Other | Source: Ambulatory Visit

## 2017-07-31 DIAGNOSIS — M47814 Spondylosis without myelopathy or radiculopathy, thoracic region: Secondary | ICD-10-CM | POA: Insufficient documentation

## 2017-07-31 DIAGNOSIS — I517 Cardiomegaly: Secondary | ICD-10-CM | POA: Insufficient documentation

## 2017-07-31 DIAGNOSIS — Z87891 Personal history of nicotine dependence: Secondary | ICD-10-CM | POA: Insufficient documentation

## 2017-07-31 DIAGNOSIS — I2510 Atherosclerotic heart disease of native coronary artery without angina pectoris: Secondary | ICD-10-CM | POA: Insufficient documentation

## 2017-07-31 DIAGNOSIS — I700 Atherosclerosis of aorta: Secondary | ICD-10-CM | POA: Insufficient documentation

## 2017-07-31 DIAGNOSIS — J432 Centrilobular emphysema: Secondary | ICD-10-CM | POA: Diagnosis not present

## 2017-07-31 DIAGNOSIS — Z122 Encounter for screening for malignant neoplasm of respiratory organs: Secondary | ICD-10-CM | POA: Diagnosis not present

## 2017-08-03 ENCOUNTER — Encounter: Payer: Self-pay | Admitting: *Deleted

## 2017-08-03 ENCOUNTER — Encounter: Payer: Self-pay | Admitting: Family Medicine

## 2017-08-03 DIAGNOSIS — I2721 Secondary pulmonary arterial hypertension: Secondary | ICD-10-CM | POA: Insufficient documentation

## 2017-08-07 ENCOUNTER — Other Ambulatory Visit: Payer: Self-pay | Admitting: Family Medicine

## 2017-09-12 ENCOUNTER — Other Ambulatory Visit: Payer: Self-pay | Admitting: Family Medicine

## 2017-09-14 NOTE — Telephone Encounter (Signed)
Last filled:  06/14/17, #30 Last OV:  06/25/17 Next OV:  10/06/17

## 2017-10-04 ENCOUNTER — Other Ambulatory Visit: Payer: Self-pay | Admitting: Family Medicine

## 2017-10-04 DIAGNOSIS — E1142 Type 2 diabetes mellitus with diabetic polyneuropathy: Secondary | ICD-10-CM

## 2017-10-05 ENCOUNTER — Other Ambulatory Visit (INDEPENDENT_AMBULATORY_CARE_PROVIDER_SITE_OTHER): Payer: Medicare Other

## 2017-10-05 DIAGNOSIS — E1142 Type 2 diabetes mellitus with diabetic polyneuropathy: Secondary | ICD-10-CM

## 2017-10-05 LAB — COMPREHENSIVE METABOLIC PANEL
ALT: 20 U/L (ref 0–35)
AST: 21 U/L (ref 0–37)
Albumin: 4.3 g/dL (ref 3.5–5.2)
Alkaline Phosphatase: 66 U/L (ref 39–117)
BUN: 24 mg/dL — ABNORMAL HIGH (ref 6–23)
CO2: 27 mEq/L (ref 19–32)
Calcium: 9.5 mg/dL (ref 8.4–10.5)
Chloride: 103 mEq/L (ref 96–112)
Creatinine, Ser: 0.61 mg/dL (ref 0.40–1.20)
GFR: 104.52 mL/min (ref >60.00)
Glucose, Bld: 110 mg/dL — ABNORMAL HIGH (ref 70–99)
Potassium: 4.4 mEq/L (ref 3.5–5.1)
Sodium: 138 mEq/L (ref 135–145)
Total Bilirubin: 0.4 mg/dL (ref 0.2–1.2)
Total Protein: 7 g/dL (ref 6.0–8.3)

## 2017-10-05 LAB — HEMOGLOBIN A1C: Hgb A1c MFr Bld: 5.6 % (ref 4.6–6.5)

## 2017-10-05 NOTE — Addendum Note (Signed)
Addended by: Alvina Chou on: 10/05/2017 02:28 PM   Modules accepted: Orders

## 2017-10-06 ENCOUNTER — Encounter: Payer: Self-pay | Admitting: Family Medicine

## 2017-10-06 ENCOUNTER — Other Ambulatory Visit (INDEPENDENT_AMBULATORY_CARE_PROVIDER_SITE_OTHER): Payer: Medicare Other

## 2017-10-06 ENCOUNTER — Ambulatory Visit (INDEPENDENT_AMBULATORY_CARE_PROVIDER_SITE_OTHER): Payer: Medicare Other | Admitting: Family Medicine

## 2017-10-06 VITALS — BP 124/80 | HR 80 | Temp 98.0°F | Wt 292.2 lb

## 2017-10-06 DIAGNOSIS — E7800 Pure hypercholesterolemia, unspecified: Secondary | ICD-10-CM | POA: Diagnosis not present

## 2017-10-06 DIAGNOSIS — R202 Paresthesia of skin: Secondary | ICD-10-CM

## 2017-10-06 DIAGNOSIS — E1142 Type 2 diabetes mellitus with diabetic polyneuropathy: Secondary | ICD-10-CM | POA: Diagnosis not present

## 2017-10-06 DIAGNOSIS — J019 Acute sinusitis, unspecified: Secondary | ICD-10-CM | POA: Diagnosis not present

## 2017-10-06 DIAGNOSIS — I2721 Secondary pulmonary arterial hypertension: Secondary | ICD-10-CM

## 2017-10-06 DIAGNOSIS — Z6841 Body Mass Index (BMI) 40.0 and over, adult: Secondary | ICD-10-CM | POA: Diagnosis not present

## 2017-10-06 LAB — VITAMIN B12: Vitamin B-12: 690 pg/mL (ref 211–911)

## 2017-10-06 LAB — LIPID PANEL
Cholesterol: 148 mg/dL (ref 0–200)
HDL: 62.9 mg/dL (ref >39.00)
LDL Cholesterol: 73 mg/dL (ref 0–99)
NonHDL: 85.3
Total CHOL/HDL Ratio: 2
Triglycerides: 64 mg/dL (ref 0.0–149.0)
VLDL: 12.8 mg/dL (ref 0.0–40.0)

## 2017-10-06 NOTE — Assessment & Plan Note (Signed)
Chronic, update FLP. Continue simvstatin.

## 2017-10-06 NOTE — Assessment & Plan Note (Signed)
Anticipate viral given short duration. Supportive care reviewed. Update if ongoing symptoms towards end of week for abx course (likely augmentin course).

## 2017-10-06 NOTE — Assessment & Plan Note (Addendum)
Encouraged ongoing weight loss efforts. Continues ketogenic diet. Difficult time recently due to family stressors. Continue victoza

## 2017-10-06 NOTE — Assessment & Plan Note (Addendum)
Chronic, stable off metformin. Continue victoza. Benign foot exam today. ?hand paresthesias related. Will update B12 and nerve conduction study for left upper extremity

## 2017-10-06 NOTE — Progress Notes (Addendum)
BP 124/80 (BP Location: Left Arm, Patient Position: Sitting, Cuff Size: Normal) Comment (BP Location): Lower arm  Pulse 80   Temp 98 F (36.7 C) (Oral)   Wt 292 lb 4 oz (132.6 kg)   SpO2 95%   BMI 50.16 kg/m    CC: 3 mo f/u visit Subjective:    Patient ID: Amy Barnes, female    DOB: [birth year], 67 y.o.   MRN: [id number]  HPI: Amy Barnes is a 68 y.o. female presenting on 10/06/2017 for 3 mo follow-up; Numbness (in left fingers.  Started about 1 yr ago, worsening. H/o carpal tunnel.); and Nasal Congestion (for about 1 wk.  Tried OTC cold meds)   Recent trip to [place]. Lost 2 brothers 2018. Emotional eater - trouble controlling intake. Some marital stress - with husband's negativity.   1 wk h/o ST, rhinorrhea, sinus pressure and congestion R>L. No fevers/chills. Taking dayquil, OTC remedies. No sick contacts at home.   See prior note for details. Following ketodiet (20gm carb/day and 1500 cal/day diet) has led to 90 lb weight loss over the past year, stable since 07/2017.   Recent lung CT - CAD, aortic ATH, pulm artery enlargement suggesting PAH, and emphysema.  Noticing worsening L hand numbness with 1st-4th distal digits affected. Interested in NCS. H/o CTS. Has cut fingers when preparing meals due to loss of sensation.  Lab Results  Component Value Date   VITAMINB12 690 10/06/2017     DM - metformin was stopped last year due to good control. Continues victoza.  Lab Results  Component Value Date   HGBA1C 5.6 10/05/2017     Relevant past medical, surgical, family and social history reviewed and updated as indicated. Interim medical history since our last visit reviewed. Allergies and medications reviewed and updated. Outpatient Medications Prior to Visit  Medication Sig Dispense Refill  . albuterol (PROVENTIL HFA;VENTOLIN HFA) 108 (90 BASE) MCG/ACT inhaler Inhale 2 puffs into the lungs every 6 (six) hours as needed for wheezing or shortness of breath. Reported on 04/17/2016      . aspirin 81 MG EC tablet Take 81 mg by mouth at bedtime.     . BD PEN NEEDLE NANO U/F 32G X 4 MM MISC USE PEN NEEDLE TO ADMINISTER VICTOZA ONCE DAILY AND AS DIRECTED. DX. E11.42 100 each 1  . cyclobenzaprine (FLEXERIL) 10 MG tablet Take 1 tablet (10 mg total) by mouth 3 (three) times daily as needed for muscle spasms. 50 tablet 1  . doxycycline (VIBRA-TABS) 100 MG tablet Take 1 tablet (100 mg total) by mouth at bedtime. Reported on 12/31/2015 90 tablet 2  . fluconazole (DIFLUCAN) 150 MG tablet Take 150 mg by mouth See admin instructions. As needed for yeast infection take 150mg  once then repeat in 72 hours.    . Fluticasone-Salmeterol (ADVAIR) 250-50 MCG/DOSE AEPB Inhale 1 puff into the lungs 2 (two) times daily.    . furosemide (LASIX) 40 MG tablet Take 1 tablet (40 mg total) by mouth daily as needed.    . lamoTRIgine (LAMICTAL) 200 MG tablet Take 200 mg by mouth daily.      Marland Kitchen levothyroxine (SYNTHROID, LEVOTHROID) 150 MCG tablet Take 1 tablet (150 mcg total) by mouth at bedtime. 90 tablet 3  . losartan (COZAAR) 50 MG tablet TAKE 1 TABLET (50 MG TOTAL) BY MOUTH DAILY. 90 tablet 3  . meloxicam (MOBIC) 15 MG tablet TAKE 0.5-1 TABLETS (7.5-15 MG TOTAL) BY MOUTH DAILY. AS NEEDED 30 tablet 4  . montelukast (  SINGULAIR) 10 MG tablet TAKE 1 TABLET (10 MG TOTAL) BY MOUTH AT BEDTIME. 90 tablet 3  . Multiple Vitamin (MULTIVITAMIN WITH MINERALS) TABS tablet Take 2 tablets by mouth daily.     . ondansetron (ZOFRAN) 8 MG tablet Take 8 mg by mouth every 8 (eight) hours as needed for nausea or vomiting.     . Oxcarbazepine (TRILEPTAL) 300 MG tablet Take 300-600 mg by mouth 2 (two) times daily. Pt takes one tablet in the morning and two in the evening.    . pantoprazole (PROTONIX) 40 MG tablet TAKE 1 TABLET (40 MG TOTAL) BY MOUTH DAILY. 90 tablet 3  . promethazine (PHENERGAN) 25 MG tablet Take 25 mg by mouth every 6 (six) hours as needed for nausea or vomiting.    . simvastatin (ZOCOR) 40 MG tablet TAKE 1 TABLET BY  MOUTH EVERY EVENING 90 tablet 0  . VICTOZA 18 MG/3ML SOPN INJECT 0.3 MLS (1.8 MG TOTAL) INTO THE SKIN AT BEDTIME. 27 mL 3  . zaleplon (SONATA) 10 MG capsule Take 10 mg by mouth at bedtime as needed for sleep. Reported on 04/17/2016     No facility-administered medications prior to visit.      Per HPI unless specifically indicated in ROS section below Review of Systems     Objective:    BP 124/80 (BP Location: Left Arm, Patient Position: Sitting, Cuff Size: Normal) Comment (BP Location): Lower arm  Pulse 80   Temp 98 F (36.7 C) (Oral)   Wt 292 lb 4 oz (132.6 kg)   SpO2 95%   BMI 50.16 kg/m   Wt Readings from Last 3 Encounters:  10/06/17 292 lb 4 oz (132.6 kg)  07/31/17 292 lb 6.4 oz (132.6 kg)  06/25/17 294 lb 4 oz (133.5 kg)    Physical Exam  Constitutional: She is oriented to person, place, and time. She appears well-developed and well-nourished. No distress.  HENT:  Head: Normocephalic and atraumatic.  Right Ear: External ear normal.  Left Ear: External ear normal.  Nose: Nose normal.  Mouth/Throat: Oropharynx is clear and moist. No oropharyngeal exudate.  Eyes: Conjunctivae and EOM are normal. Pupils are equal, round, and reactive to light. No scleral icterus.  Neck: Normal range of motion. Neck supple.  Cardiovascular: Normal rate, regular rhythm, normal heart sounds and intact distal pulses.  No murmur heard. Pulmonary/Chest: Effort normal and breath sounds normal. No respiratory distress. She has no wheezes. She has no rales.  Musculoskeletal: She exhibits no edema.  See HPI for foot exam if done  Lymphadenopathy:    She has no cervical adenopathy.  Neurological: She is alert and oriented to person, place, and time.  Neg tinel and phalen bilaterally  Skin: Skin is warm and dry. No rash noted.  Psychiatric: She has a normal mood and affect.  Nursing note and vitals reviewed.  Diabetic Foot Exam - Simple   Simple Foot Form Diabetic Foot exam was performed with  the following findings:  Yes 10/06/2017  9:37 AM  Visual Inspection No deformities, no ulcerations, no other skin breakdown bilaterally:  Yes Sensation Testing Intact to touch and monofilament testing bilaterally:  Yes Pulse Check Posterior Tibialis and Dorsalis pulse intact bilaterally:  Yes Comments     Results for orders placed or performed in visit on 10/05/17  Hemoglobin A1c  Result Value Ref Range   Hgb A1c MFr Bld 5.6 4.6 - 6.5 %  Comprehensive metabolic panel  Result Value Ref Range   Sodium 138 135 -  145 mEq/L   Potassium 4.4 3.5 - 5.1 mEq/L   Chloride 103 96 - 112 mEq/L   CO2 27 19 - 32 mEq/L   Glucose, Bld 110 (H) 70 - 99 mg/dL   BUN 24 (H) 6 - 23 mg/dL   Creatinine, Ser 1.61 0.40 - 1.20 mg/dL   Total Bilirubin 0.4 0.2 - 1.2 mg/dL   Alkaline Phosphatase 66 39 - 117 U/L   AST 21 0 - 37 U/L   ALT 20 0 - 35 U/L   Total Protein 7.0 6.0 - 8.3 g/dL   Albumin 4.3 3.5 - 5.2 g/dL   Calcium 9.5 8.4 - 09.6 mg/dL   GFR 045.40 >98.11 mL/min   Lab Results  Component Value Date   TSH 1.24 01/29/2017    Lab Results  Component Value Date   CHOL 148 10/06/2017   HDL 62.90 10/06/2017   LDLCALC 73 10/06/2017   LDLDIRECT 48.0 03/13/2016   TRIG 64.0 10/06/2017   CHOLHDL 2 10/06/2017       Assessment & Plan:   Problem List Items Addressed This Visit    Acute sinusitis    Anticipate viral given short duration. Supportive care reviewed. Update if ongoing symptoms towards end of week for abx course (likely augmentin course).       BMI 50.0-59.9, adult (HCC)    Encouraged ongoing weight loss efforts. Continues ketogenic diet. Difficult time recently due to family stressors. 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 Continue victoza. Benign foot exam today. ?hand paresthesias related. Will update B12 and nerve conduction study for left upper extremity       Hand paresthesia    worsening of left  hand, in h/o CTS. Will update NCS. Add b12 to blood in lab.   ADDENDUM ==> after visit, pt requested referral back to Dr Merlyn Lot who she has previously seen for further evaluation. Will cancel NCS and refer.       Relevant Orders   Ambulatory referral to Hand Surgery   HYPERCHOLESTEROLEMIA    Chronic, update FLP. Continue simvstatin.       PAH (pulmonary artery hypertension) (HCC)    Suggested by recent CT 06/2017.           Follow up plan: Return in about 3 months (around 01/04/2018) for annual exam, prior fasting for blood work, medicare wellness visit.  [name], MD

## 2017-10-06 NOTE — Patient Instructions (Addendum)
Labs are looking ok today. We will check nerve conduction studies for left hand numbness.  Return in 3-4 months for medicare wellness visit with Virl Axe and follow up with me.  You have a sinus infection. Push fluids and plenty of rest. Nasal saline irrigation or neti pot to help drain sinuses. May use plain mucinex with plenty of fluid to help mobilize mucous. If not improving into end of week let us know for antibiotic course. Let us know sooner if worsening or fever 101.5

## 2017-10-06 NOTE — Assessment & Plan Note (Signed)
Suggested by recent CT 06/2017.

## 2017-10-06 NOTE — Assessment & Plan Note (Addendum)
worsening of left hand, in h/o CTS. Will update NCS. Add b12 to blood in lab.   ADDENDUM ==> after visit, pt requested referral back to Dr Merlyn Lot who she has previously seen for further evaluation. Will cancel NCS and refer.

## 2017-10-09 ENCOUNTER — Telehealth: Payer: Self-pay | Admitting: Family Medicine

## 2017-10-09 MED ORDER — AMOXICILLIN-POT CLAVULANATE 875-125 MG PO TABS: 1.0000 | ORAL_TABLET | Freq: Two times a day (BID) | ORAL | 0 refills | Status: AC

## 2017-10-09 NOTE — Addendum Note (Signed)
Addended by: [name] on: 10/09/2017 12:03 AM   Modules accepted: Orders

## 2017-10-09 NOTE — Telephone Encounter (Signed)
plz notify I've sent in augmentin abx to pharmacy.  Ensure she's taken penicillins ok.

## 2017-10-09 NOTE — Telephone Encounter (Signed)
Spoke with pt relaying notifying her rx was sent for augmentin.  I asked about PCN allergies.  She says she cannot have penicillins but she thinks she has had augmentin in the past with no issues.  Says she will start abx and if any reaction she will notify Dr. Reece Agar.

## 2017-10-09 NOTE — Telephone Encounter (Signed)
Copied from CRM [id number]. Topic: Quick Communication - See Telephone Encounter >> Oct 09, 2017  8:58 AM Elliot Gault wrote: CRM for notification. See Telephone encounter for:   10/09/17.   Relation to pt: self  Call back number:  [phone]  Pharmacy: [facility] [address] [place] - [phone] (Phone) [phone] (Fax)    Reason for call:  Patient was seen on 10/06/17 by Dr. Sharen Hones patient states PCP recommended if symptoms don't improve he would send Rx, patient experiencing sore throat, sinus pressure underneath eyes and ear pressure, please advise

## 2017-10-16 DIAGNOSIS — G5602 Carpal tunnel syndrome, left upper limb: Secondary | ICD-10-CM | POA: Diagnosis not present

## 2017-10-16 DIAGNOSIS — M5412 Radiculopathy, cervical region: Secondary | ICD-10-CM | POA: Diagnosis not present

## 2017-10-20 DIAGNOSIS — G5612 Other lesions of median nerve, left upper limb: Secondary | ICD-10-CM | POA: Diagnosis not present

## 2017-10-20 DIAGNOSIS — G5602 Carpal tunnel syndrome, left upper limb: Secondary | ICD-10-CM | POA: Diagnosis not present

## 2017-10-21 ENCOUNTER — Other Ambulatory Visit: Payer: Self-pay | Admitting: Orthopedic Surgery

## 2017-10-21 DIAGNOSIS — G5602 Carpal tunnel syndrome, left upper limb: Secondary | ICD-10-CM | POA: Diagnosis not present

## 2017-11-01 ENCOUNTER — Other Ambulatory Visit: Payer: Self-pay | Admitting: Family Medicine

## 2017-11-15 ENCOUNTER — Other Ambulatory Visit: Payer: Self-pay | Admitting: Family Medicine

## 2017-11-20 ENCOUNTER — Other Ambulatory Visit: Payer: Self-pay | Admitting: Family Medicine

## 2017-11-24 ENCOUNTER — Encounter: Payer: Self-pay | Admitting: Family Medicine

## 2017-11-25 ENCOUNTER — Other Ambulatory Visit: Payer: Self-pay | Admitting: Family Medicine

## 2017-11-25 NOTE — Telephone Encounter (Signed)
Last filled:  08/30/17, #90 Last OV:  10/06/17 Next OV:  01/12/18

## 2017-12-12 ENCOUNTER — Other Ambulatory Visit: Payer: Self-pay | Admitting: Family Medicine

## 2017-12-14 NOTE — Telephone Encounter (Signed)
Last filled:  09/15/17, #30 Last OV:  10/06/17 Next OV (CPE):  01/12/18

## 2017-12-21 ENCOUNTER — Encounter (HOSPITAL_BASED_OUTPATIENT_CLINIC_OR_DEPARTMENT_OTHER): Payer: Self-pay | Admitting: *Deleted

## 2017-12-22 ENCOUNTER — Encounter (HOSPITAL_BASED_OUTPATIENT_CLINIC_OR_DEPARTMENT_OTHER)
Admission: RE | Admit: 2017-12-22 | Discharge: 2017-12-22 | Disposition: A | Discharge status: Home / Self Care | Payer: Medicare Other | Source: Ambulatory Visit | Attending: Orthopedic Surgery | Admitting: Orthopedic Surgery

## 2017-12-22 ENCOUNTER — Encounter (HOSPITAL_BASED_OUTPATIENT_CLINIC_OR_DEPARTMENT_OTHER): Payer: Self-pay | Admitting: *Deleted

## 2017-12-22 ENCOUNTER — Other Ambulatory Visit: Payer: Self-pay

## 2017-12-22 DIAGNOSIS — F419 Anxiety disorder, unspecified: Secondary | ICD-10-CM | POA: Diagnosis not present

## 2017-12-22 DIAGNOSIS — Z882 Allergy status to sulfonamides status: Secondary | ICD-10-CM | POA: Diagnosis not present

## 2017-12-22 DIAGNOSIS — M1990 Unspecified osteoarthritis, unspecified site: Secondary | ICD-10-CM | POA: Diagnosis not present

## 2017-12-22 DIAGNOSIS — Z96643 Presence of artificial hip joint, bilateral: Secondary | ICD-10-CM | POA: Diagnosis not present

## 2017-12-22 DIAGNOSIS — I10 Essential (primary) hypertension: Secondary | ICD-10-CM | POA: Diagnosis not present

## 2017-12-22 DIAGNOSIS — J432 Centrilobular emphysema: Secondary | ICD-10-CM | POA: Diagnosis not present

## 2017-12-22 DIAGNOSIS — K219 Gastro-esophageal reflux disease without esophagitis: Secondary | ICD-10-CM | POA: Diagnosis not present

## 2017-12-22 DIAGNOSIS — Z7982 Long term (current) use of aspirin: Secondary | ICD-10-CM | POA: Diagnosis not present

## 2017-12-22 DIAGNOSIS — E039 Hypothyroidism, unspecified: Secondary | ICD-10-CM | POA: Diagnosis not present

## 2017-12-22 DIAGNOSIS — E1151 Type 2 diabetes mellitus with diabetic peripheral angiopathy without gangrene: Secondary | ICD-10-CM | POA: Diagnosis not present

## 2017-12-22 DIAGNOSIS — Z79899 Other long term (current) drug therapy: Secondary | ICD-10-CM | POA: Diagnosis not present

## 2017-12-22 DIAGNOSIS — I700 Atherosclerosis of aorta: Secondary | ICD-10-CM | POA: Diagnosis not present

## 2017-12-22 DIAGNOSIS — G5602 Carpal tunnel syndrome, left upper limb: Secondary | ICD-10-CM | POA: Diagnosis not present

## 2017-12-22 DIAGNOSIS — Z885 Allergy status to narcotic agent status: Secondary | ICD-10-CM | POA: Diagnosis not present

## 2017-12-22 DIAGNOSIS — G2581 Restless legs syndrome: Secondary | ICD-10-CM | POA: Diagnosis not present

## 2017-12-22 DIAGNOSIS — Z8614 Personal history of Methicillin resistant Staphylococcus aureus infection: Secondary | ICD-10-CM | POA: Diagnosis not present

## 2017-12-22 DIAGNOSIS — Z8249 Family history of ischemic heart disease and other diseases of the circulatory system: Secondary | ICD-10-CM | POA: Diagnosis not present

## 2017-12-22 DIAGNOSIS — I2510 Atherosclerotic heart disease of native coronary artery without angina pectoris: Secondary | ICD-10-CM | POA: Diagnosis not present

## 2017-12-22 DIAGNOSIS — Z888 Allergy status to other drugs, medicaments and biological substances status: Secondary | ICD-10-CM | POA: Diagnosis not present

## 2017-12-22 DIAGNOSIS — F319 Bipolar disorder, unspecified: Secondary | ICD-10-CM | POA: Diagnosis not present

## 2017-12-22 DIAGNOSIS — Z87891 Personal history of nicotine dependence: Secondary | ICD-10-CM | POA: Diagnosis not present

## 2017-12-22 DIAGNOSIS — G4733 Obstructive sleep apnea (adult) (pediatric): Secondary | ICD-10-CM | POA: Diagnosis not present

## 2017-12-22 DIAGNOSIS — Z881 Allergy status to other antibiotic agents status: Secondary | ICD-10-CM | POA: Diagnosis not present

## 2017-12-22 LAB — BASIC METABOLIC PANEL
Anion gap: 10 (ref 5–15)
BUN: 10 mg/dL (ref 6–20)
CO2: 26 mmol/L (ref 22–32)
Calcium: 9.7 mg/dL (ref 8.9–10.3)
Chloride: 97 mmol/L — ABNORMAL LOW (ref 101–111)
Creatinine, Ser: 0.53 mg/dL (ref 0.44–1.00)
GFR calc Af Amer: >60 mL/min (ref >60)
GFR calc non Af Amer: >60 mL/min (ref >60)
Glucose, Bld: 90 mg/dL (ref 65–99)
Potassium: 4.6 mmol/L (ref 3.5–5.1)
Sodium: 133 mmol/L — ABNORMAL LOW (ref 135–145)

## 2017-12-22 NOTE — Progress Notes (Signed)
   12/22/17 0916  OBSTRUCTIVE SLEEP APNEA  Have you ever been diagnosed with sleep apnea through a sleep study? Yes  If yes, do you have and use a CPAP or BPAP machine every night? 0  Do you snore loudly (loud enough to be heard through closed doors)?  1  Do you often feel tired, fatigued, or sleepy during the daytime (such as falling asleep during driving or talking to someone)? 0  Has anyone observed you stop breathing during your sleep? 0  Do you have, or are you being treated for high blood pressure? 1  BMI more than 35 kg/m2? 1  Age > 50 (1-yes) 1  Female Gender (Yes=1) 0  Obstructive Sleep Apnea Score 4

## 2017-12-22 NOTE — Progress Notes (Signed)
EKG reviewed by Dr. Turk, will proceed with surgery as scheduled. 

## 2017-12-24 ENCOUNTER — Other Ambulatory Visit: Payer: Self-pay

## 2017-12-24 ENCOUNTER — Ambulatory Visit (HOSPITAL_BASED_OUTPATIENT_CLINIC_OR_DEPARTMENT_OTHER)
Admission: RE | Admit: 2017-12-24 | Discharge: 2017-12-24 | Disposition: A | Discharge status: Home / Self Care | Payer: Medicare Other | Source: Ambulatory Visit | Attending: Orthopedic Surgery | Admitting: Orthopedic Surgery

## 2017-12-24 ENCOUNTER — Encounter (HOSPITAL_BASED_OUTPATIENT_CLINIC_OR_DEPARTMENT_OTHER)
Admission: RE | Disposition: A | Discharge status: Home / Self Care | Payer: Self-pay | Source: Ambulatory Visit | Attending: Orthopedic Surgery

## 2017-12-24 ENCOUNTER — Ambulatory Visit (HOSPITAL_BASED_OUTPATIENT_CLINIC_OR_DEPARTMENT_OTHER): Payer: Medicare Other | Admitting: Anesthesiology

## 2017-12-24 ENCOUNTER — Encounter (HOSPITAL_BASED_OUTPATIENT_CLINIC_OR_DEPARTMENT_OTHER): Payer: Self-pay | Admitting: Anesthesiology

## 2017-12-24 DIAGNOSIS — Z885 Allergy status to narcotic agent status: Secondary | ICD-10-CM | POA: Insufficient documentation

## 2017-12-24 DIAGNOSIS — M1990 Unspecified osteoarthritis, unspecified site: Secondary | ICD-10-CM | POA: Diagnosis not present

## 2017-12-24 DIAGNOSIS — I10 Essential (primary) hypertension: Secondary | ICD-10-CM | POA: Diagnosis not present

## 2017-12-24 DIAGNOSIS — E1151 Type 2 diabetes mellitus with diabetic peripheral angiopathy without gangrene: Secondary | ICD-10-CM | POA: Diagnosis not present

## 2017-12-24 DIAGNOSIS — G5602 Carpal tunnel syndrome, left upper limb: Secondary | ICD-10-CM | POA: Diagnosis not present

## 2017-12-24 DIAGNOSIS — Z8249 Family history of ischemic heart disease and other diseases of the circulatory system: Secondary | ICD-10-CM | POA: Insufficient documentation

## 2017-12-24 DIAGNOSIS — I700 Atherosclerosis of aorta: Secondary | ICD-10-CM | POA: Insufficient documentation

## 2017-12-24 DIAGNOSIS — Z888 Allergy status to other drugs, medicaments and biological substances status: Secondary | ICD-10-CM | POA: Insufficient documentation

## 2017-12-24 DIAGNOSIS — K219 Gastro-esophageal reflux disease without esophagitis: Secondary | ICD-10-CM | POA: Insufficient documentation

## 2017-12-24 DIAGNOSIS — Z96643 Presence of artificial hip joint, bilateral: Secondary | ICD-10-CM | POA: Insufficient documentation

## 2017-12-24 DIAGNOSIS — Z79899 Other long term (current) drug therapy: Secondary | ICD-10-CM | POA: Insufficient documentation

## 2017-12-24 DIAGNOSIS — G2581 Restless legs syndrome: Secondary | ICD-10-CM | POA: Diagnosis not present

## 2017-12-24 DIAGNOSIS — Z6841 Body Mass Index (BMI) 40.0 and over, adult: Secondary | ICD-10-CM | POA: Insufficient documentation

## 2017-12-24 DIAGNOSIS — J432 Centrilobular emphysema: Secondary | ICD-10-CM | POA: Insufficient documentation

## 2017-12-24 DIAGNOSIS — F419 Anxiety disorder, unspecified: Secondary | ICD-10-CM | POA: Diagnosis not present

## 2017-12-24 DIAGNOSIS — E1140 Type 2 diabetes mellitus with diabetic neuropathy, unspecified: Secondary | ICD-10-CM | POA: Diagnosis not present

## 2017-12-24 DIAGNOSIS — F319 Bipolar disorder, unspecified: Secondary | ICD-10-CM | POA: Diagnosis not present

## 2017-12-24 DIAGNOSIS — G4733 Obstructive sleep apnea (adult) (pediatric): Secondary | ICD-10-CM | POA: Insufficient documentation

## 2017-12-24 DIAGNOSIS — E039 Hypothyroidism, unspecified: Secondary | ICD-10-CM | POA: Insufficient documentation

## 2017-12-24 DIAGNOSIS — Z8614 Personal history of Methicillin resistant Staphylococcus aureus infection: Secondary | ICD-10-CM | POA: Insufficient documentation

## 2017-12-24 DIAGNOSIS — Z881 Allergy status to other antibiotic agents status: Secondary | ICD-10-CM | POA: Insufficient documentation

## 2017-12-24 DIAGNOSIS — Z87891 Personal history of nicotine dependence: Secondary | ICD-10-CM | POA: Insufficient documentation

## 2017-12-24 DIAGNOSIS — Z7982 Long term (current) use of aspirin: Secondary | ICD-10-CM | POA: Insufficient documentation

## 2017-12-24 DIAGNOSIS — Z882 Allergy status to sulfonamides status: Secondary | ICD-10-CM | POA: Insufficient documentation

## 2017-12-24 DIAGNOSIS — I2510 Atherosclerotic heart disease of native coronary artery without angina pectoris: Secondary | ICD-10-CM | POA: Insufficient documentation

## 2017-12-24 HISTORY — PX: CARPAL TUNNEL RELEASE: SHX101

## 2017-12-24 LAB — GLUCOSE, CAPILLARY
Glucose-Capillary: 100 mg/dL — ABNORMAL HIGH (ref 65–99)
Glucose-Capillary: 95 mg/dL (ref 65–99)

## 2017-12-24 SURGERY — CARPAL TUNNEL RELEASE
Anesthesia: Regional | Site: Wrist | Laterality: Left

## 2017-12-24 MED ORDER — MIDAZOLAM HCL 2 MG/2ML IJ SOLN
INTRAMUSCULAR | Status: AC
Start: 2017-12-24 — End: 2017-12-24
  Filled 2017-12-24: qty 2

## 2017-12-24 MED ORDER — 0.9 % SODIUM CHLORIDE (POUR BTL) OPTIME
TOPICAL | Status: DC | PRN
  Administered 2017-12-24: 100 mL

## 2017-12-24 MED ORDER — SCOPOLAMINE 1 MG/3DAYS TD PT72: 1.0000 | MEDICATED_PATCH | Freq: Once | TRANSDERMAL | Status: DC | PRN

## 2017-12-24 MED ORDER — PROMETHAZINE HCL 25 MG/ML IJ SOLN: 6.2500 mg | INTRAMUSCULAR | Status: DC | PRN

## 2017-12-24 MED ORDER — CLINDAMYCIN PHOSPHATE 900 MG/50ML IV SOLN
900.0000 mg | INTRAVENOUS | Status: AC
  Administered 2017-12-24: 900 mg via INTRAVENOUS

## 2017-12-24 MED ORDER — BUPIVACAINE HCL (PF) 0.25 % IJ SOLN
INTRAMUSCULAR | Status: DC | PRN
  Administered 2017-12-24: 9 mL

## 2017-12-24 MED ORDER — LIDOCAINE HCL (PF) 0.5 % IJ SOLN
INTRAMUSCULAR | Status: DC | PRN
  Administered 2017-12-24: 30 mL via INTRAVENOUS

## 2017-12-24 MED ORDER — CHLORHEXIDINE GLUCONATE 4 % EX LIQD: 60.0000 mL | Freq: Once | CUTANEOUS | Status: DC

## 2017-12-24 MED ORDER — MIDAZOLAM HCL 2 MG/2ML IJ SOLN
1.0000 mg | INTRAMUSCULAR | Status: DC | PRN
  Administered 2017-12-24 (×2): 1 mg via INTRAVENOUS

## 2017-12-24 MED ORDER — MEPERIDINE HCL 25 MG/ML IJ SOLN: 6.2500 mg | INTRAMUSCULAR | Status: DC | PRN

## 2017-12-24 MED ORDER — LACTATED RINGERS IV SOLN
INTRAVENOUS | Status: DC
  Administered 2017-12-24: 08:00:00 via INTRAVENOUS

## 2017-12-24 MED ORDER — FENTANYL CITRATE (PF) 100 MCG/2ML IJ SOLN: 25.0000 ug | INTRAMUSCULAR | Status: DC | PRN

## 2017-12-24 MED ORDER — FENTANYL CITRATE (PF) 100 MCG/2ML IJ SOLN
50.0000 ug | INTRAMUSCULAR | Status: DC | PRN
  Administered 2017-12-24 (×2): 50 ug via INTRAVENOUS

## 2017-12-24 MED ORDER — PROPOFOL 500 MG/50ML IV EMUL
INTRAVENOUS | Status: DC | PRN
  Administered 2017-12-24: 25 ug/kg/min via INTRAVENOUS

## 2017-12-24 MED ORDER — FENTANYL CITRATE (PF) 100 MCG/2ML IJ SOLN
INTRAMUSCULAR | Status: AC
  Filled 2017-12-24: qty 2

## 2017-12-24 MED ORDER — BUPIVACAINE HCL (PF) 0.25 % IJ SOLN
INTRAMUSCULAR | Status: AC
  Filled 2017-12-24: qty 30

## 2017-12-24 MED ORDER — PROPOFOL 500 MG/50ML IV EMUL
INTRAVENOUS | Status: AC
  Filled 2017-12-24: qty 50

## 2017-12-24 MED ORDER — CLINDAMYCIN PHOSPHATE 900 MG/50ML IV SOLN
INTRAVENOUS | Status: AC
  Filled 2017-12-24: qty 50

## 2017-12-24 MED ORDER — OXYCODONE-ACETAMINOPHEN 5-325 MG PO TABS: ORAL_TABLET | ORAL | 0 refills | Status: DC

## 2017-12-24 MED ORDER — MIDAZOLAM HCL 2 MG/2ML IJ SOLN: 0.5000 mg | Freq: Once | INTRAMUSCULAR | Status: DC | PRN

## 2017-12-24 MED ORDER — ONDANSETRON HCL 4 MG/2ML IJ SOLN
INTRAMUSCULAR | Status: AC
  Filled 2017-12-24: qty 2

## 2017-12-24 MED ORDER — ONDANSETRON HCL 4 MG/2ML IJ SOLN
INTRAMUSCULAR | Status: DC | PRN
  Administered 2017-12-24: 4 mg via INTRAVENOUS

## 2017-12-24 SURGICAL SUPPLY — 42 items
BANDAGE ACE 3X5.8 VEL STRL LF (GAUZE/BANDAGES/DRESSINGS) ×3 IMPLANT
BLADE SURG 15 STRL LF DISP TIS (BLADE) ×2 IMPLANT
BLADE SURG 15 STRL SS (BLADE) ×6
BNDG CMPR 9X4 STRL LF SNTH (GAUZE/BANDAGES/DRESSINGS)
BNDG ESMARK 4X9 LF (GAUZE/BANDAGES/DRESSINGS) IMPLANT
BNDG GAUZE ELAST 4 BULKY (GAUZE/BANDAGES/DRESSINGS) ×3 IMPLANT
CHLORAPREP W/TINT 26ML (MISCELLANEOUS) ×3 IMPLANT
CORD BIPOLAR FORCEPS 12FT (ELECTRODE) ×3 IMPLANT
COVER BACK TABLE 60X90IN (DRAPES) ×3 IMPLANT
COVER MAYO STAND STRL (DRAPES) ×3 IMPLANT
CUFF TOURNIQUET SINGLE 18IN (TOURNIQUET CUFF) ×3 IMPLANT
DRAPE EXTREMITY T 121X128X90 (DRAPE) ×3 IMPLANT
DRAPE SURG 17X23 STRL (DRAPES) ×3 IMPLANT
DRSG PAD ABDOMINAL 8X10 ST (GAUZE/BANDAGES/DRESSINGS) ×3 IMPLANT
GAUZE SPONGE 4X4 12PLY STRL (GAUZE/BANDAGES/DRESSINGS) ×3 IMPLANT
GAUZE XEROFORM 1X8 LF (GAUZE/BANDAGES/DRESSINGS) ×3 IMPLANT
GLOVE BIO SURGEON STRL SZ7.5 (GLOVE) ×3 IMPLANT
GLOVE BIOGEL PI IND STRL 7.0 (GLOVE) IMPLANT
GLOVE BIOGEL PI IND STRL 7.5 (GLOVE) IMPLANT
GLOVE BIOGEL PI IND STRL 8 (GLOVE) ×1 IMPLANT
GLOVE BIOGEL PI INDICATOR 7.0 (GLOVE) ×2
GLOVE BIOGEL PI INDICATOR 7.5 (GLOVE) ×2
GLOVE BIOGEL PI INDICATOR 8 (GLOVE) ×2
GLOVE SURG SYN 7.5  E (GLOVE) ×2
GLOVE SURG SYN 7.5 E (GLOVE) ×1 IMPLANT
GLOVE SURG SYN 7.5 PF PI (GLOVE) IMPLANT
GOWN STRL REUS W/ TWL LRG LVL3 (GOWN DISPOSABLE) ×1 IMPLANT
GOWN STRL REUS W/ TWL XL LVL3 (GOWN DISPOSABLE) IMPLANT
GOWN STRL REUS W/TWL LRG LVL3 (GOWN DISPOSABLE)
GOWN STRL REUS W/TWL XL LVL3 (GOWN DISPOSABLE) ×6 IMPLANT
NDL HYPO 25X1 1.5 SAFETY (NEEDLE) ×1 IMPLANT
NEEDLE HYPO 25X1 1.5 SAFETY (NEEDLE) ×3 IMPLANT
NS IRRIG 1000ML POUR BTL (IV SOLUTION) ×3 IMPLANT
PACK BASIN DAY SURGERY FS (CUSTOM PROCEDURE TRAY) ×3 IMPLANT
PADDING CAST ABS 4INX4YD NS (CAST SUPPLIES) ×2
PADDING CAST ABS COTTON 4X4 ST (CAST SUPPLIES) ×1 IMPLANT
STOCKINETTE 4X48 STRL (DRAPES) ×3 IMPLANT
SUT ETHILON 4 0 PS 2 18 (SUTURE) ×3 IMPLANT
SYR BULB 3OZ (MISCELLANEOUS) ×3 IMPLANT
SYR CONTROL 10ML LL (SYRINGE) ×3 IMPLANT
TOWEL OR 17X24 6PK STRL BLUE (TOWEL DISPOSABLE) ×6 IMPLANT
UNDERPAD 30X30 (UNDERPADS AND DIAPERS) ×3 IMPLANT

## 2017-12-24 NOTE — Addendum Note (Signed)
Addendum  created 12/24/17 1105 by Burna Cash, CRNA   Intraprocedure Flowsheets edited

## 2017-12-24 NOTE — H&P (Signed)
Amy Barnes is an 67 y.o. female.   Chief Complaint: left carpal tunnel release HPI: 67 yo female with numbness in left hand.  Positive nerve conduction studies.  She wishes to have a carpal tunnel release.  Allergies:  Allergies  Allergen Reactions  . Cephalexin Hives  . Hydrocodone Other (See Comments)    Reaction:  Hallucinations   . Risperidone And Related Other (See Comments)    Reaction:  Made pt excessively sleepy  . Seroquel [Quetiapine Fumarate] Other (See Comments)    Reaction:  Made pt excessively sleepy  . Sulfa Antibiotics Rash    Past Medical History:  Diagnosis Date  . Allergic rhinitis   . Anemia   . Anxiety   . Asthma    seasonal  . Bipolar affective disorder ([place])    takes Synthroid meds for Bipolar  . CAD (coronary artery disease) 07/2016   by CT scan  . Carpal tunnel syndrome   . Cataract   . Centrilobular emphysema ([place]) 07/2016   by CT scan - pt not aware of this  . Constipation due to pain medication   . Depression with anxiety   . Diabetes mellitus   . GERD (gastroesophageal reflux disease)   . 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 Hypertension   . OSA (obstructive sleep apnea)    no longer using cpap, uses a bed that raises and lowers hob  . Osteoarthritis   . Osteoarthritis   . Pneumonia   . Restless legs   . Septic arthritis ([place]) 10/11/2012  . Shingles 06/30/2016  . Shortness of breath   . Status post revision of total hip replacement bilateral   prosthetic infection R 2013, L 2015  . Thoracic aortic atherosclerosis ([place]) 11/207   by CT    Past Surgical History:  Procedure Laterality Date  . CARPAL TUNNEL RELEASE Right 05/15/2011  . CERVICAL FUSION  2012   C2/3/4  . COLONOSCOPY WITH PROPOFOL N/A 12/31/2015   diverticulosis, int hem, o/w normal rpt 10 yrs ([place])  . EYE SURGERY Bilateral    cataract surgery with lens implant  . FOOT SURGERY  1982   bone spur  . I&D EXTREMITY  09/06/2012   [name], MD; Right;  I&D of right thigh  . I&D EXTREMITY Left 07/2013   wound vac - daily doxycycline indefinitely  . KNEE ARTHROSCOPY  09/01/2012   [name], MD;  Right  . LUMBAR FUSION  01/08/2012   L3-4  . LUMBAR LAMINECTOMY/DECOMPRESSION MICRODISCECTOMY N/A 11/13/2016   LAMINOTOMY/LAMINECTOMY LUMBAR FOUR LUMBAR FIVE  WITH RESECTION OF SYNOVIAL CYST;  Surgeon: Newman Pies, MD  . NECK SURGERY     Herniated disk C2,3,4  . NOSE SURGERY    . PARTIAL HYSTERECTOMY  1984   for mennorhagia, ovaries remain  . REVISION TOTAL HIP ARTHROPLASTY Left 08/28/2011  . TMJ ARTHROPLASTY  1982  . TOTAL HIP ARTHROPLASTY Left 2002  . TRIGGER FINGER RELEASE Right 05/15/2011   long finger    Family History: Family History  Problem Relation Age of Onset  . Aneurysm Father 65       brain  . Alcohol abuse Father   . Cancer Father        possibly  . CAD Other        several siblings  . Cancer Brother        prostate  . Diabetes Brother   . Anesthesia problems Neg Hx   .  Hypotension Neg Hx   . Malignant hyperthermia Neg Hx   . Pseudochol deficiency Neg Hx     Social History:   reports that she quit smoking about 8 years ago. Her smoking use included cigarettes. She smoked 1.50 packs per day. She has never used smokeless tobacco. She reports that she drinks alcohol. She reports that she does not use drugs.  Medications: Medications Prior to Admission  Medication Sig Dispense Refill  . aspirin 81 MG EC tablet Take 81 mg by mouth at bedtime.     . cyclobenzaprine (FLEXERIL) 10 MG tablet Take 1 tablet (10 mg total) by mouth 3 (three) times daily as needed for muscle spasms. 50 tablet 1  . doxycycline (VIBRA-TABS) 100 MG tablet TAKE 1 TABLET (100 MG TOTAL) BY MOUTH AT BEDTIME. REPORTED ON 12/31/2015 90 tablet 2  . furosemide (LASIX) 40 MG tablet Take 1 tablet (40 mg total) by mouth daily as needed.    . lamoTRIgine (LAMICTAL) 200 MG tablet Take 200 mg by mouth daily.      Marland Kitchen  levothyroxine (SYNTHROID, LEVOTHROID) 150 MCG tablet Take 1 tablet (150 mcg total) by mouth at bedtime. 90 tablet 3  . losartan (COZAAR) 50 MG tablet TAKE 1 TABLET (50 MG TOTAL) BY MOUTH DAILY. 90 tablet 0  . meloxicam (MOBIC) 15 MG tablet TAKE 0.5-1 TABLETS (7.5-15 MG TOTAL) BY MOUTH DAILY. AS NEEDED 30 tablet 3  . montelukast (SINGULAIR) 10 MG tablet TAKE 1 TABLET (10 MG TOTAL) BY MOUTH AT BEDTIME. 90 tablet 0  . Multiple Vitamin (MULTIVITAMIN WITH MINERALS) TABS tablet Take 2 tablets by mouth daily.     . ondansetron (ZOFRAN) 8 MG tablet Take 8 mg by mouth every 8 (eight) hours as needed for nausea or vomiting.     . Oxcarbazepine (TRILEPTAL) 300 MG tablet Take 300-600 mg by mouth 2 (two) times daily. Pt takes one tablet in the morning and two in the evening.    . pantoprazole (PROTONIX) 40 MG tablet TAKE 1 TABLET (40 MG TOTAL) BY MOUTH DAILY. 90 tablet 0  . simvastatin (ZOCOR) 40 MG tablet TAKE 1 TABLET BY MOUTH EVERY DAY IN THE EVENING 90 tablet 0  . VICTOZA 18 MG/3ML SOPN INJECT 0.3 MLS (1.8 MG TOTAL) INTO THE SKIN AT BEDTIME. 27 mL 3  . albuterol (PROVENTIL HFA;VENTOLIN HFA) 108 (90 BASE) MCG/ACT inhaler Inhale 2 puffs into the lungs every 6 (six) hours as needed for wheezing or shortness of breath. Reported on 04/17/2016    . BD PEN NEEDLE NANO U/F 32G X 4 MM MISC USE PEN NEEDLE TO ADMINISTER VICTOZA ONCE DAILY AND AS DIRECTED. DX. E11.42 100 each 1  . fluconazole (DIFLUCAN) 150 MG tablet Take 150 mg by mouth See admin instructions. As needed for yeast infection take '150mg'$  once then repeat in 72 hours.    . Fluticasone-Salmeterol (ADVAIR) 250-50 MCG/DOSE AEPB Inhale 1 puff into the lungs 2 (two) times daily.    . promethazine (PHENERGAN) 25 MG tablet Take 25 mg by mouth every 6 (six) hours as needed for nausea or vomiting.    . zaleplon (SONATA) 10 MG capsule Take 10 mg by mouth at bedtime as needed for sleep. Reported on 04/17/2016      Results for orders placed or performed during the  hospital encounter of 12/24/17 (from the past 48 hour(s))  Basic metabolic panel     Status: Abnormal   Collection Time: 12/22/17  4:00 PM  Result Value Ref Range   Sodium 133 (  L) 135 - 145 mmol/L   Potassium 4.6 3.5 - 5.1 mmol/L   Chloride 97 (L) 101 - 111 mmol/L   CO2 26 22 - 32 mmol/L   Glucose, Bld 90 65 - 99 mg/dL   BUN 10 6 - 20 mg/dL   Creatinine, Ser 0.53 0.44 - 1.00 mg/dL   Calcium 9.7 8.9 - 10.3 mg/dL   GFR calc non Af Amer >60 >60 mL/min   GFR calc Af Amer >60 >60 mL/min    Comment: (NOTE) The eGFR has been calculated using the CKD EPI equation. This calculation has not been validated in all clinical situations. eGFR's persistently <60 mL/min signify possible Chronic Kidney Disease.    Anion gap 10 5 - 15    Comment: Performed at [facility] [address], [place], [place] [address]  Glucose, capillary     Status: Abnormal   Collection Time: 12/24/17  7:27 AM  Result Value Ref Range   Glucose-Capillary 100 (H) 65 - 99 mg/dL    No results found.   A comprehensive review of systems was negative.  Blood pressure (!) 145/75, pulse 74, temperature 97.8 F (36.6 C), temperature source Oral, resp. rate 18, height '5\' 4"'$  (1.626 m), weight 127.2 kg (280 lb 8 oz), SpO2 98 %.  General appearance: alert, cooperative and appears stated age Head: Normocephalic, without obvious abnormality, atraumatic Neck: supple, symmetrical, trachea midline Cardio: regular rate and rhythm Resp: clear to auscultation bilaterally Extremities: Intact sensation and capillary refill all digits.  +epl/fpl/io.  No wounds. Pulses: 2+ and symmetric Skin: Skin color, texture, turgor normal. No rashes or lesions Neurologic: Grossly normal Incision/Wound: none  Assessment/Plan Left carpal tunnel syndrome.  Non operative and operative treatment options were discussed with the patient and patient wishes to proceed with operative treatment. Risks, benefits, and alternatives of surgery were  discussed and the patient agrees with the plan of care.   Maisie Hauser R 12/24/2017, 8:34 AM

## 2017-12-24 NOTE — Anesthesia Preprocedure Evaluation (Addendum)
Anesthesia Evaluation  Patient identified by MRN, date of birth, ID band Patient awake    Reviewed: Allergy & Precautions, NPO status , Patient's Chart, lab work & pertinent test results  History of Anesthesia Complications Negative for: history of anesthetic complications  Airway Mallampati: I  TM Distance: >3 FB Neck ROM: Full    Dental  (+) Dental Advisory Given, Caps   Pulmonary sleep apnea , COPD (last inhaler needed 6 months ago),  COPD inhaler, former smoker (quit 2010),    breath sounds clear to auscultation       Cardiovascular hypertension, + Peripheral Vascular Disease and + DOE   Rhythm:Regular Rate:Normal     Neuro/Psych Anxiety Depression Bipolar Disorder Diabetic peripheral neuropathy    GI/Hepatic Neg liver ROS, GERD  Medicated and Controlled,  Endo/Other  diabetes (Victoza)Hypothyroidism Morbid obesity  Renal/GU negative Renal ROS     Musculoskeletal  (+) Arthritis ,   Abdominal (+) + obese,   Peds  Hematology negative hematology ROS (+)   Anesthesia Other Findings   Reproductive/Obstetrics                           Anesthesia Physical Anesthesia Plan  ASA: III  Anesthesia Plan: Bier Block and Bier Block-LIDOCAINE ONLY   Post-op Pain Management:    Induction:   PONV Risk Score and Plan: 2 and Ondansetron and Treatment may vary due to age or medical condition  Airway Management Planned: Natural Airway and Nasal Cannula  Additional Equipment:   Intra-op Plan:   Post-operative Plan:   Informed Consent: I have reviewed the patients History and Physical, chart, labs and discussed the procedure including the risks, benefits and alternatives for the proposed anesthesia with the patient or authorized representative who has indicated his/her understanding and acceptance.   Dental advisory given  Plan Discussed with: CRNA and Surgeon  Anesthesia Plan Comments: (Plan  routine monitors, IV regional lidocaine)        Anesthesia Quick Evaluation

## 2017-12-24 NOTE — Op Note (Signed)
12/24/2017 [name]                              OPERATIVE REPORT   PREOPERATIVE DIAGNOSIS:  Left carpal tunnel syndrome.  POSTOPERATIVE DIAGNOSIS:  Left carpal tunnel syndrome.  PROCEDURE:  Left carpal tunnel release.  SURGEON:  [name], MD  ASSISTANT:  none.  ANESTHESIA:  Bier block and sedation.  IV FLUIDS:  Per anesthesia flow sheet.  ESTIMATED BLOOD LOSS:  Minimal.  COMPLICATIONS:  None.  SPECIMENS:  None.  TOURNIQUET TIME:    Total Tourniquet Time Documented: Forearm (Left) - 24 minutes Total: Forearm (Left) - 24 minutes   DISPOSITION:  Stable to PACU.  LOCATION: [facility]  INDICATIONS:  67 yo female with numbness in left hand.  Positive nerve conduction studies.  She wishes to have a carpal tunnel release for management of her symptoms.  Risks, benefits and alternatives of surgery were discussed including the risk of blood loss; infection; damage to nerves, vessels, tendons, ligaments, bone; failure of surgery; need for additional surgery; complications with wound healing; continued pain; recurrence of carpal tunnel syndrome; and damage to motor branch. She voiced understanding of these risks and elected to proceed.   OPERATIVE COURSE:  After being identified preoperatively by myself, the patient and I agreed upon the procedure and site of procedure.  The surgical site was marked.  The risks, benefits, and alternatives of the surgery were reviewed and she wished to proceed.  Surgical consent had been signed.  She was given IV clindamycin as preoperative antibiotic prophylaxis.  She was transferred to the operating room and placed on the operating room table in supine position with the Left upper extremity on an armboard.  Bier block and sedation was induced by Anesthesiology.  Left upper extremity was prepped and draped in normal sterile orthopaedic fashion.  A surgical pause was performed between the surgeons, anesthesia, and  operating room staff, and all were in agreement as to the patient, procedure, and site of procedure.  Tourniquet at the proximal aspect of the forearm had been inflated for the Bier block.   Incision was made over the transverse carpal ligament and carried into the subcutaneous tissues by spreading technique.  Bipolar electrocautery was used to obtain hemostasis.  The palmar fascia was sharply incised.  The transverse carpal ligament was identified and sharply incised.  It was incised distally first.  Care was taken to ensure complete decompression distally.  It was then incised proximally.  Scissors were used to split the distal aspect of the volar antebrachial fascia.  A finger was placed into the wound to ensure complete decompression, which was the case.  The nerve was examined.  It was adherent to the radial leaflet and hyperemic.  The motor branch was identified and was intact.  The wound was copiously irrigated with sterile saline.  It was then closed with 4-0 nylon in a horizontal mattress fashion.  It was injected with 0.25% plain Marcaine to aid in postoperative analgesia.  It was dressed with sterile Xeroform, 4x4s, an ABD, and wrapped with Kerlix and an Ace bandage.  Tourniquet was deflated at 24 minutes.  Fingertips were pink with brisk capillary refill after deflation of the tourniquet.  Operative drapes were broken down.  The patient was awoken from anesthesia safely.  She was transferred back to stretcher and taken to the PACU in stable condition.  I will see her back  in the office in 1 week for postoperative followup.  I will give her a prescription for norco 5/325 1-2 tabs PO q6 hours prn pain, dispense #20.    Amy Ribas, MD Electronically signed, 12/24/17

## 2017-12-24 NOTE — Transfer of Care (Signed)
Immediate Anesthesia Transfer of Care Note  Patient: Amy Barnes  Procedure(s) Performed: LEFT CARPAL TUNNEL RELEASE (Left Wrist)  Patient Location: PACU  Anesthesia Type:MAC  Level of Consciousness: awake, alert  and oriented  Airway & Oxygen Therapy: Patient Spontanous Breathing  Post-op Assessment: Report given to RN and Post -op Vital signs reviewed and stable  Post vital signs: Reviewed and stable  Last Vitals:  Vitals Value Taken Time  BP    Temp    Pulse    Resp    SpO2      Last Pain:  Vitals:   12/24/17 0721  TempSrc: Oral  PainSc: 3       Patients Stated Pain Goal: 3 (50/93/26 7124)  Complications: No apparent anesthesia complications

## 2017-12-24 NOTE — Anesthesia Procedure Notes (Signed)
Anesthesia Regional Block: Bier block (IV Regional)   Pre-Anesthetic Checklist: ,, timeout performed, Correct Patient, Correct Site, Correct Laterality, Correct Procedure,, site marked, surgical consent,, at surgeon's request Needles:  Injection technique: Single-shot  Needle Type: Other      Needle Gauge: 22     Additional Needles:   Procedures:,,,,, intact distal pulses, Esmarch exsanguination, single tourniquet utilized,  Narrative:   Performed by: Personally       

## 2017-12-24 NOTE — Anesthesia Postprocedure Evaluation (Signed)
Anesthesia Post Note  Patient: Amy Barnes  Procedure(s) Performed: LEFT CARPAL TUNNEL RELEASE (Left Wrist)     Patient location during evaluation: PACU Anesthesia Type: Bier Block Level of consciousness: awake and alert, oriented and patient cooperative Pain management: pain level controlled Vital Signs Assessment: post-procedure vital signs reviewed and stable Respiratory status: spontaneous breathing, nonlabored ventilation and respiratory function stable Cardiovascular status: blood pressure returned to baseline and stable Postop Assessment: no apparent nausea or vomiting and adequate PO intake Anesthetic complications: no    Last Vitals:  Vitals:   12/24/17 0930 12/24/17 1000  BP: 123/73 135/70  Pulse: 73 64  Resp: 17 16  Temp:  36.7 C  SpO2: 97% 98%    Last Pain:  Vitals:   12/24/17 1000  TempSrc:   PainSc: 0-No pain                 Roberts Bon,E. Charika Mikelson

## 2017-12-24 NOTE — Brief Op Note (Signed)
12/24/2017  9:15 AM  PATIENT:  Amy Barnes  67 y.o. female  PRE-OPERATIVE DIAGNOSIS:  LEFT CARPAL TUNNEL SYNDROME  POST-OPERATIVE DIAGNOSIS:  LEFT CARPAL TUNNEL SYNDROME  PROCEDURE:  Procedure(s): LEFT CARPAL TUNNEL RELEASE (Left)  SURGEON:  Surgeon(s) and Role:    * [name], MD - Primary  PHYSICIAN ASSISTANT:   ASSISTANTS: none   ANESTHESIA:   Bier block with sedation  EBL:  5 mL   BLOOD ADMINISTERED:none  DRAINS: none   LOCAL MEDICATIONS USED:  MARCAINE     SPECIMEN:  No Specimen  DISPOSITION OF SPECIMEN:  N/A  COUNTS:  YES  TOURNIQUET:   Total Tourniquet Time Documented: Forearm (Left) - 24 minutes Total: Forearm (Left) - 24 minutes   DICTATION: .Note written in EPIC  PLAN OF CARE: Discharge to home after PACU  PATIENT DISPOSITION:  PACU - hemodynamically stable.

## 2017-12-24 NOTE — Discharge Instructions (Addendum)
[facility] Instructions °Hand Surgery ° °Wound Care: °Keep your hand elevated above the level of your heart.  Do not allow it to dangle by your side.  Keep the dressing dry and do not remove it unless your doctor advises you to do so.  He will usually change it at the time of your post-op visit.  Moving your fingers is advised to stimulate circulation but will depend on the site of your surgery.  If you have a splint applied, your doctor will advise you regarding movement. ° °Activity: °Do not drive or operate machinery today.  Rest today and then you may return to your normal activity and work as indicated by your physician. ° °Diet:  °Drink liquids today or eat a light diet.  You may resume a regular diet tomorrow.   ° °General expectations: °Pain for two to three days. °Fingers may become slightly swollen. ° °Call your doctor if any of the following occur: °Severe pain not relieved by pain medication. °Elevated temperature. °Dressing soaked with blood. °Inability to move fingers. °[place] or bluish color to fingers. ° ° °Post Anesthesia Home Care Instructions ° °Activity: °Get plenty of rest for the remainder of the day. A responsible individual must stay with you for 24 hours following the procedure.  °For the next 24 hours, DO NOT: °-Drive a car °-Operate machinery °-Drink alcoholic beverages °-Take any medication unless instructed by your physician °-Make any legal decisions or sign important papers. ° °Meals: °Start with liquid foods such as gelatin or soup. Progress to regular foods as tolerated. Avoid greasy, spicy, heavy foods. If nausea and/or vomiting occur, drink only clear liquids until the nausea and/or vomiting subsides. Call your physician if vomiting continues. ° °Special Instructions/Symptoms: °Your throat may feel dry or sore from the anesthesia or the breathing tube placed in your throat during surgery. If this causes discomfort, gargle with warm salt water. The discomfort should disappear within  24 hours. ° °If you had a scopolamine patch placed behind your ear for the management of post- operative nausea and/or vomiting: ° °1. The medication in the patch is effective for 72 hours, after which it should be removed.  Wrap patch in a tissue and discard in the trash. Wash hands thoroughly with soap and water. °2. You may remove the patch earlier than 72 hours if you experience unpleasant side effects which may include dry mouth, dizziness or visual disturbances. °3. Avoid touching the patch. Wash your hands with soap and water after contact with the patch. °  ° ° °

## 2017-12-25 ENCOUNTER — Encounter (HOSPITAL_BASED_OUTPATIENT_CLINIC_OR_DEPARTMENT_OTHER): Payer: Self-pay | Admitting: Orthopedic Surgery

## 2018-01-05 ENCOUNTER — Other Ambulatory Visit: Payer: Self-pay | Admitting: Family Medicine

## 2018-01-05 ENCOUNTER — Ambulatory Visit (INDEPENDENT_AMBULATORY_CARE_PROVIDER_SITE_OTHER): Payer: Medicare Other

## 2018-01-05 VITALS — BP 120/76 | HR 81 | Temp 98.2°F | Ht 63.5 in | Wt 289.5 lb

## 2018-01-05 DIAGNOSIS — E1142 Type 2 diabetes mellitus with diabetic polyneuropathy: Secondary | ICD-10-CM

## 2018-01-05 DIAGNOSIS — Z0000 Encounter for general adult medical examination without abnormal findings: Secondary | ICD-10-CM | POA: Diagnosis not present

## 2018-01-05 DIAGNOSIS — E7800 Pure hypercholesterolemia, unspecified: Secondary | ICD-10-CM

## 2018-01-05 DIAGNOSIS — F3170 Bipolar disorder, currently in remission, most recent episode unspecified: Secondary | ICD-10-CM

## 2018-01-05 DIAGNOSIS — E039 Hypothyroidism, unspecified: Secondary | ICD-10-CM

## 2018-01-05 DIAGNOSIS — Z114 Encounter for screening for human immunodeficiency virus [HIV]: Secondary | ICD-10-CM | POA: Diagnosis not present

## 2018-01-05 LAB — LIPID PANEL
Cholesterol: 168 mg/dL (ref 0–200)
HDL: 77.9 mg/dL (ref >39.00)
LDL Cholesterol: 81 mg/dL (ref 0–99)
NonHDL: 89.85
Total CHOL/HDL Ratio: 2
Triglycerides: 43 mg/dL (ref 0.0–149.0)
VLDL: 8.6 mg/dL (ref 0.0–40.0)

## 2018-01-05 LAB — CBC WITH DIFFERENTIAL/PLATELET
Basophils Absolute: 0.1 10*3/uL (ref 0.0–0.1)
Basophils Relative: 0.9 % (ref 0.0–3.0)
Eosinophils Absolute: 0.2 10*3/uL (ref 0.0–0.7)
Eosinophils Relative: 3 % (ref 0.0–5.0)
HCT: 38.8 % (ref 36.0–46.0)
Hemoglobin: 13 g/dL (ref 12.0–15.0)
Lymphocytes Relative: 27.7 % (ref 12.0–46.0)
Lymphs Abs: 1.7 10*3/uL (ref 0.7–4.0)
MCHC: 33.5 g/dL (ref 30.0–36.0)
MCV: 88.3 fl (ref 78.0–100.0)
Monocytes Absolute: 0.6 10*3/uL (ref 0.1–1.0)
Monocytes Relative: 9.4 % (ref 3.0–12.0)
Neutro Abs: 3.6 10*3/uL (ref 1.4–7.7)
Neutrophils Relative %: 59 % (ref 43.0–77.0)
Platelets: 244 10*3/uL (ref 150.0–400.0)
RBC: 4.39 Mil/uL (ref 3.87–5.11)
RDW: 15.9 % — ABNORMAL HIGH (ref 11.5–15.5)
WBC: 6 10*3/uL (ref 4.0–10.5)

## 2018-01-05 LAB — HEMOGLOBIN A1C: Hgb A1c MFr Bld: 5.5 % (ref 4.6–6.5)

## 2018-01-05 LAB — T4, FREE: Free T4: 0.74 ng/dL (ref 0.60–1.60)

## 2018-01-05 LAB — TSH: TSH: 1 u[IU]/mL (ref 0.35–4.50)

## 2018-01-05 NOTE — Progress Notes (Signed)
I reviewed health advisor's note, was available for consultation, and agree with documentation and plan.  

## 2018-01-05 NOTE — Progress Notes (Signed)
PCP notes:   Health maintenance:  A1C - completed HIV screening - completed  Abnormal screenings:   None  Patient concerns:   None  Nurse concerns:  None  Next PCP appt:   01/12/18 @ 0930

## 2018-01-05 NOTE — Patient Instructions (Signed)
Amy Barnes , Thank you for taking time to come for your Medicare Wellness Visit. I appreciate your ongoing commitment to your health goals. Please review the following plan we discussed and let me know if I can assist you in the future.   These are the goals we discussed: Goals    . Increase physical activity     Starting 01/05/2018, I will continue to walk for 3000-7000 steps daily.        This is a list of the screening recommended for you and due dates:  Health Maintenance  Topic Date Due  . DTaP/Tdap/Td vaccine (1 - Tdap) 01/06/2019*  . Mammogram  01/06/2019*  . Pap Smear  01/06/2019*  . DEXA scan (bone density measurement)  01/06/2019*  . Tetanus Vaccine  01/06/2019*  . Pneumonia vaccines (1 of 2 - PCV13) 01/06/2019*  . Flu Shot  04/29/2018  . Hemoglobin A1C  07/07/2018  . Eye exam for diabetics  07/13/2018  . Complete foot exam   10/06/2018  . Colon Cancer Screening  12/30/2025  .  Hepatitis C: One time screening is recommended by Center for Disease Control  (CDC) for  adults born from [birth year] through 1964.   Completed  . HIV Screening  Completed  *Topic was postponed. The date shown is not the original due date.   Preventive Care for Adults  A healthy lifestyle and preventive care can promote health and wellness. Preventive health guidelines for adults include the following key practices.  . A routine yearly physical is a good way to check with your health care provider about your health and preventive screening. It is a chance to share any concerns and updates on your health and to receive a thorough exam.  . Visit your dentist for a routine exam and preventive care every 6 months. Brush your teeth twice a day and floss once a day. Good oral hygiene prevents tooth decay and gum disease.  . The frequency of eye exams is based on your age, health, family medical history, use  of contact lenses, and other factors. Follow your health care provider's recommendations for frequency  of eye exams.  . Eat a healthy diet. Foods like vegetables, fruits, whole grains, low-fat dairy products, and lean protein foods contain the nutrients you need without too many calories. Decrease your intake of foods high in solid fats, added sugars, and salt. Eat the right amount of calories for you. Get information about a proper diet from your health care provider, if necessary.  . Regular physical exercise is one of the most important things you can do for your health. Most adults should get at least 150 minutes of moderate-intensity exercise (any activity that increases your heart rate and causes you to sweat) each week. In addition, most adults need muscle-strengthening exercises on 2 or more days a week.  Silver Sneakers may be a benefit available to you. To determine eligibility, you may visit the website: www.silversneakers.com or contact program at 539 492 3084 Mon-Fri between 8AM-8PM.   . Maintain a healthy weight. The body mass index (BMI) is a screening tool to identify possible weight problems. It provides an estimate of body fat based on height and weight. Your health care provider can find your BMI and can help you achieve or maintain a healthy weight.   For adults 20 years and older: ? A BMI below 18.5 is considered underweight. ? A BMI of 18.5 to 24.9 is normal. ? A BMI of 25 to 29.9 is considered overweight. ?  A BMI of 30 and above is considered obese.   . Maintain normal blood lipids and cholesterol levels by exercising and minimizing your intake of saturated fat. Eat a balanced diet with plenty of fruit and vegetables. Blood tests for lipids and cholesterol should begin at age 50 and be repeated every 5 years. If your lipid or cholesterol levels are high, you are over 50, or you are at high risk for heart disease, you may need your cholesterol levels checked more frequently. Ongoing high lipid and cholesterol levels should be treated with medicines if diet and exercise are not  working.  . If you smoke, find out from your health care provider how to quit. If you do not use tobacco, please do not start.  . If you choose to drink alcohol, please do not consume more than 2 drinks per day. One drink is considered to be 12 ounces (355 mL) of beer, 5 ounces (148 mL) of wine, or 1.5 ounces (44 mL) of liquor.  . If you are 51-73 years old, ask your health care provider if you should take aspirin to prevent strokes.  . Use sunscreen. Apply sunscreen liberally and repeatedly throughout the day. You should seek shade when your shadow is shorter than you. Protect yourself by wearing long sleeves, pants, a wide-brimmed hat, and sunglasses year round, whenever you are outdoors.  . Once a month, do a whole body skin exam, using a mirror to look at the skin on your back. Tell your health care provider of new moles, moles that have irregular borders, moles that are larger than a pencil eraser, or moles that have changed in shape or color.

## 2018-01-05 NOTE — Progress Notes (Signed)
Subjective:   Amy Barnes is a 67 y.o. female who presents for Medicare Annual (Subsequent) preventive examination.  Review of Systems:  N/A Cardiac Risk Factors include: advanced age (>27men, >1 women);diabetes mellitus;obesity (BMI >30kg/m2);dyslipidemia     Objective:     Vitals: BP 120/76 (BP Location: Right Wrist, Patient Position: Sitting, Cuff Size: Normal)   Pulse 81   Temp 98.2 F (36.8 C) (Oral)   Ht 5' 3.5" (1.613 m) Comment: no shoes  Wt 289 lb 8 oz (131.3 kg)   SpO2 94%   BMI 50.48 kg/m   Body mass index is 50.48 kg/m.  Advanced Directives 12/24/2017 12/22/2017 11/13/2016 06/09/2016 05/14/2016 04/17/2016 12/31/2015  Does Patient Have a Medical Advance Directive? Yes Yes Yes No No No No  Type of Estate agent of [place];Living will Living will;Healthcare Power of Attorney - - - - -  Does patient want to make changes to medical advance directive? No - Patient declined - - - - - -  Copy of Healthcare Power of Attorney in Chart? No - copy requested - - - - - -  Would patient like information on creating a medical advance directive? - - - - - - -  Pre-existing out of facility DNR order (yellow form or pink MOST form) - - - - - - -    Tobacco Social History   Tobacco Use  Smoking Status Former Smoker  . Packs/day: 1.50  . Types: Cigarettes  . Last attempt to quit: 04/29/2009  . Years since quitting: 8.6  Smokeless Tobacco Never Used     Counseling given: No   Clinical Intake:  Pre-visit preparation completed: Yes  Pain : No/denies pain Pain Score: 0-No pain     Nutritional Status: BMI > 30  Obese Nutritional Risks: None Diabetes: Yes CBG done?: No Did pt. bring in CBG monitor from home?: No  How often do you need to have someone help you when you read instructions, pamphlets, or other written materials from your doctor or pharmacy?: 1 - Never What is the last grade level you completed in school?: 12th grade + some college  courses  Interpreter Needed?: No  Comments: pt lives with spouse Information entered by :: LPinson, LPN  Past Medical History:  Diagnosis Date  . Allergic rhinitis   . Anemia   . Anxiety   . Asthma    seasonal  . Bipolar affective disorder (HCC)    takes Synthroid meds for Bipolar  . CAD (coronary artery disease) 07/2016   by CT scan  . Carpal tunnel syndrome   . Cataract   . Centrilobular emphysema (HCC) 07/2016   by CT scan - pt not aware of this  . Constipation due to pain medication   . Depression with anxiety   . Diabetes mellitus   . GERD (gastroesophageal reflux disease)   . 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 Hypertension   . OSA (obstructive sleep apnea)    no longer using cpap, uses a bed that raises and lowers hob  . Osteoarthritis   . Osteoarthritis   . Pneumonia   . Restless legs   . Septic arthritis (HCC) 10/11/2012  . Shingles 06/30/2016  . Shortness of breath   . Status post revision of total hip replacement bilateral   prosthetic infection R 2013, L 2015  . Thoracic aortic atherosclerosis (HCC) 11/207   by CT   Past Surgical History:  Procedure Laterality Date  . CARPAL TUNNEL RELEASE Right 05/15/2011  . CARPAL TUNNEL RELEASE Left 12/24/2017   Procedure: LEFT CARPAL TUNNEL RELEASE;  Surgeon: [name], MD;  Location: [facility];  Service: Orthopedics;  Laterality: Left;  . CERVICAL FUSION  2012   C2/3/4  . COLONOSCOPY WITH PROPOFOL N/A 12/31/2015   diverticulosis, int hem, o/w normal rpt 10 yrs ([facility])  . EYE SURGERY Bilateral    cataract surgery with lens implant  . FOOT SURGERY  1982   bone spur  . I&D EXTREMITY  09/06/2012   Budd Palmer, MD; Right;  I&D of right thigh  . I&D EXTREMITY Left 07/2013   wound vac - daily doxycycline indefinitely  . KNEE ARTHROSCOPY  09/01/2012   Dannielle Huh, MD;  Right  . LUMBAR FUSION  01/08/2012   L3-4  . LUMBAR LAMINECTOMY/DECOMPRESSION  MICRODISCECTOMY N/A 11/13/2016   LAMINOTOMY/LAMINECTOMY LUMBAR FOUR LUMBAR FIVE  WITH RESECTION OF SYNOVIAL CYST;  Surgeon: Tressie Stalker, MD  . NECK SURGERY     Herniated disk C2,3,4  . NOSE SURGERY    . PARTIAL HYSTERECTOMY  1984   for mennorhagia, ovaries remain  . REVISION TOTAL HIP ARTHROPLASTY Left 08/28/2011  . TMJ ARTHROPLASTY  1982  . TOTAL HIP ARTHROPLASTY Left 2002  . TRIGGER FINGER RELEASE Right 05/15/2011   long finger   Family History  Problem Relation Age of Onset  . Aneurysm Father 62       brain  . Alcohol abuse Father   . Cancer Father        possibly  . CAD Other        several siblings  . Cancer Brother        prostate  . Diabetes Brother   . Anesthesia problems Neg Hx   . Hypotension Neg Hx   . Malignant hyperthermia Neg Hx   . Pseudochol deficiency Neg Hx    Social History   Socioeconomic History  . Marital status: Married    Spouse name: Not on file  . Number of children: 3  . Years of education: Not on file  . Highest education level: Not on file  Occupational History  . Occupation: Designer, industrial/product: UNEMPLOYED  Social Needs  . Financial resource strain: Not on file  . Food insecurity:    Worry: Not on file    Inability: Not on file  . Transportation needs:    Medical: Not on file    Non-medical: Not on file  Tobacco Use  . Smoking status: Former Smoker    Packs/day: 1.50    Types: Cigarettes    Last attempt to quit: 04/29/2009    Years since quitting: 8.6  . Smokeless tobacco: Never Used  Substance and Sexual Activity  . Alcohol use: Yes    Comment: very rare  . Drug use: No  . Sexual activity: Never  Lifestyle  . Physical activity:    Days per week: Not on file    Minutes per session: Not on file  . Stress: Not on file  Relationships  . Social connections:    Talks on phone: Not on file    Gets together: Not on file    Attends religious service: Not on file    Active member of club or organization: Not on file     Attends meetings of clubs or organizations: Not on file    Relationship status: Not on file  Other Topics Concern  . 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 albuterol (PROVENTIL HFA;VENTOLIN HFA) 108 (90 BASE) MCG/ACT inhaler Inhale 2 puffs into the lungs every 6 (six) hours as needed for wheezing or shortness of breath. Reported on 04/17/2016  . aspirin 81 MG EC tablet Take 81 mg by mouth at bedtime.   . BD PEN NEEDLE NANO U/F 32G X 4 MM MISC USE PEN NEEDLE TO ADMINISTER VICTOZA ONCE DAILY AND AS DIRECTED. DX. E11.42  . cyclobenzaprine (FLEXERIL) 10 MG tablet Take 1 tablet (10 mg total) by mouth 3 (three) times daily as needed for muscle spasms.  Marland Kitchen doxycycline (VIBRA-TABS) 100 MG tablet TAKE 1 TABLET (100 MG TOTAL) BY MOUTH AT BEDTIME. REPORTED ON 12/31/2015  . fluconazole (DIFLUCAN) 150 MG tablet Take 150 mg by mouth See admin instructions. As needed for yeast infection take 150mg  once then repeat in 72 hours.  . Fluticasone-Salmeterol (ADVAIR) 250-50 MCG/DOSE AEPB Inhale 1 puff into the lungs 2 (two) times daily.  . furosemide (LASIX) 40 MG tablet Take 1 tablet (40 mg total) by mouth daily as needed.  . lamoTRIgine (LAMICTAL) 200 MG tablet Take 200 mg by mouth daily.    Marland Kitchen levothyroxine (SYNTHROID, LEVOTHROID) 150 MCG tablet Take 1 tablet (150 mcg total) by mouth at bedtime.  Marland Kitchen losartan (COZAAR) 50 MG tablet TAKE 1 TABLET (50 MG TOTAL) BY MOUTH DAILY.  . meloxicam (MOBIC) 15 MG tablet TAKE 0.5-1 TABLETS (7.5-15 MG TOTAL) BY MOUTH DAILY. AS NEEDED  . montelukast (SINGULAIR) 10 MG tablet TAKE 1 TABLET (10 MG TOTAL) BY MOUTH AT BEDTIME.  . Multiple Vitamin (MULTIVITAMIN WITH MINERALS) TABS tablet Take 2 tablets by mouth daily.   . ondansetron (ZOFRAN) 8  MG tablet Take 8 mg by mouth every 8 (eight) hours as needed for nausea or vomiting.   . Oxcarbazepine (TRILEPTAL) 300 MG tablet Take 300-600 mg by mouth 2 (two) times daily. Pt takes one tablet in the morning and two in the evening.  Marland Kitchen oxyCODONE-acetaminophen (PERCOCET) 5-325 MG tablet 1-2 tabs po q6 hours prn pain  . pantoprazole (PROTONIX) 40 MG tablet TAKE 1 TABLET (40 MG TOTAL) BY MOUTH DAILY.  Marland Kitchen promethazine (PHENERGAN) 25 MG tablet Take 25 mg by mouth every 6 (six) hours as needed for nausea or vomiting.  . simvastatin (ZOCOR) 40 MG tablet TAKE 1 TABLET BY MOUTH EVERY DAY IN THE EVENING  . VICTOZA 18 MG/3ML SOPN INJECT 0.3 MLS (1.8 MG TOTAL) INTO THE SKIN AT BEDTIME.  . zaleplon (SONATA) 10 MG capsule Take 10 mg by mouth at bedtime as needed for sleep. Reported on 04/17/2016   No facility-administered encounter medications on file as of 01/05/2018.     Activities of Daily Living In your present state of health, do you have any difficulty performing the following activities: 01/05/2018 12/24/2017  Hearing? N N  Vision? N N  Difficulty concentrating or making decisions? N N  Walking or climbing stairs? Y Y  Dressing or bathing? N N  Doing errands, shopping? N -  Preparing Food and eating ? N -  Using the Toilet? N -  In the past six months, have you accidently leaked urine? Y -  Do you have problems with loss of bowel control? Y -  Managing your Medications? N -  Managing your Finances? N -  Housekeeping or managing your Housekeeping? N -  Some recent data might be hidden    Patient Care Team: [name], MD as PCP - General (Family Medicine)    Assessment:   This is a routine wellness examination for Mildred.   Hearing Screening   125Hz  250Hz  500Hz  1000Hz  2000Hz  3000Hz  4000Hz  6000Hz  8000Hz   Right ear:   40 40 40  40    Left ear:   40 40 40  40       Exercise Activities and Dietary recommendations Current Exercise Habits: Home exercise routine, Type of exercise:  walking(3000-7000 steps daily), Frequency (Times/Week): 7, Intensity: Mild, Exercise limited by: None identified  Goals    . Increase physical activity     Starting 01/05/2018, I will continue to walk for 3000-7000 steps daily.        Fall Risk Fall Risk  07/08/2016 06/09/2016 05/14/2016 04/17/2016 11/01/2012  Falls in the past year? Yes No Yes Yes No  Number falls in past yr: 1 - 1 1 -  Injury with Fall? No - No No -  Risk for fall due to : Impaired balance/gait;Impaired mobility;Medication side effect - - History of fall(s);Impaired balance/gait;Impaired mobility Impaired mobility  Follow up - - Falls evaluation completed;Falls prevention discussed Falls prevention discussed -   Depression Screen PHQ 2/9 Scores 07/08/2016 05/14/2016 11/01/2012 10/25/2012  PHQ - 2 Score 0 0 0 0     Cognitive Function    MMSE - Mini Mental State Exam 01/05/2018  Orientation to time 5  Orientation to Place 5  Registration 3  Attention/ Calculation 0  Recall 3  Language- name 2 objects 0  Language- repeat 1  Language- follow 3 step command 3  Language- read & follow direction 0  Write a sentence 0  Copy design 0  Total score 20     PLEASE NOTE: A Mini-Cog screen was completed. Maximum score is 20. A value of 0 denotes this part of [place] MMSE was not completed or the patient failed this part of the Mini-Cog screening.   Mini-Cog Screening Orientation to Time - Max 5 pts Orientation to Place - Max 5 pts Registration - Max 3 pts Recall - Max 3 pts Language Repeat - Max 1 pts Language Follow 3 Step Command - Max 3 pts  Immunization History  Administered Date(s) Administered  . Influenza Split 08/15/2011, 08/30/2012  . Influenza Whole 07/24/2007, 07/19/2009, 06/25/2010  . Influenza,inj,Quad PF,6+ Mos 07/08/2016, 06/25/2017  . Influenza-Unspecified 05/05/2011, 06/20/2015  . Pneumococcal Polysaccharide-23 08/30/2012  . Td 06/30/2007  . Zoster 12/29/2013    creening Tests Health Maintenance   Topic Date Due  . DTaP/Tdap/Td (1 - Tdap) 01/06/2019 (Originally 07/01/2007)  . MAMMOGRAM  01/06/2019 (Originally 12/31/2011)  . PAP SMEAR  01/06/2019 (Originally 12/09/2013)  . DEXA SCAN  01/06/2019 (Originally 06/07/2017)  . TETANUS/TDAP  01/06/2019 (Originally 06/29/2017)  . PNA vac Low Risk Adult (1 of 2 - PCV13) 01/06/2019 (Originally 06/07/2017)  . INFLUENZA VACCINE  04/29/2018  . HEMOGLOBIN A1C  07/07/2018  . OPHTHALMOLOGY EXAM  07/13/2018  . FOOT EXAM  10/06/2018  . COLONOSCOPY  12/30/2025  . Hepatitis C Screening  Completed  . HIV Screening  Completed      Plan:     I have personally reviewed, addressed, and noted the following in the patient's chart:  A. Medical and social history B. Use of alcohol, tobacco or illicit drugs  C. Current  medications and supplements D. Functional ability and status E.  Nutritional status F.  Physical activity G. Advance directives H. List of other physicians I.  Hospitalizations, surgeries, and ER visits in previous 12 months J.  Vitals K. Screenings to include hearing, vision, cognitive, depression L. Referrals and appointments - none  In addition, I have reviewed and discussed with patient certain preventive protocols, quality metrics, and best practice recommendations. A written personalized care plan for preventive services as well as general preventive health recommendations were provided to patient.  See attached scanned questionnaire for additional information.   Signed,   Randa Evens, MHA, BS, LPN Health Coach

## 2018-01-06 LAB — HIV ANTIBODY (ROUTINE TESTING W REFLEX): HIV 1&2 Ab, 4th Generation: NONREACTIVE

## 2018-01-12 ENCOUNTER — Encounter: Payer: Self-pay | Admitting: Family Medicine

## 2018-01-12 ENCOUNTER — Ambulatory Visit (INDEPENDENT_AMBULATORY_CARE_PROVIDER_SITE_OTHER): Payer: Medicare Other | Admitting: Family Medicine

## 2018-01-12 VITALS — BP 144/78 | HR 71 | Temp 97.6°F | Wt 296.5 lb

## 2018-01-12 DIAGNOSIS — Z6841 Body Mass Index (BMI) 40.0 and over, adult: Secondary | ICD-10-CM

## 2018-01-12 DIAGNOSIS — J4520 Mild intermittent asthma, uncomplicated: Secondary | ICD-10-CM | POA: Diagnosis not present

## 2018-01-12 DIAGNOSIS — E7800 Pure hypercholesterolemia, unspecified: Secondary | ICD-10-CM

## 2018-01-12 DIAGNOSIS — Z87891 Personal history of nicotine dependence: Secondary | ICD-10-CM

## 2018-01-12 DIAGNOSIS — I10 Essential (primary) hypertension: Secondary | ICD-10-CM

## 2018-01-12 DIAGNOSIS — Z7189 Other specified counseling: Secondary | ICD-10-CM

## 2018-01-12 DIAGNOSIS — I700 Atherosclerosis of aorta: Secondary | ICD-10-CM | POA: Diagnosis not present

## 2018-01-12 DIAGNOSIS — E039 Hypothyroidism, unspecified: Secondary | ICD-10-CM

## 2018-01-12 DIAGNOSIS — E1142 Type 2 diabetes mellitus with diabetic polyneuropathy: Secondary | ICD-10-CM

## 2018-01-12 DIAGNOSIS — F3170 Bipolar disorder, currently in remission, most recent episode unspecified: Secondary | ICD-10-CM

## 2018-01-12 DIAGNOSIS — M1990 Unspecified osteoarthritis, unspecified site: Secondary | ICD-10-CM | POA: Diagnosis not present

## 2018-01-12 DIAGNOSIS — Z23 Encounter for immunization: Secondary | ICD-10-CM | POA: Diagnosis not present

## 2018-01-12 DIAGNOSIS — I2510 Atherosclerotic heart disease of native coronary artery without angina pectoris: Secondary | ICD-10-CM

## 2018-01-12 DIAGNOSIS — K219 Gastro-esophageal reflux disease without esophagitis: Secondary | ICD-10-CM

## 2018-01-12 DIAGNOSIS — I2721 Secondary pulmonary arterial hypertension: Secondary | ICD-10-CM

## 2018-01-12 DIAGNOSIS — J432 Centrilobular emphysema: Secondary | ICD-10-CM

## 2018-01-12 MED ORDER — PANTOPRAZOLE SODIUM 40 MG PO TBEC: 40.0000 mg | DELAYED_RELEASE_TABLET | Freq: Every day | ORAL | 3 refills | Status: DC

## 2018-01-12 MED ORDER — FLUCONAZOLE 150 MG PO TABS: 150.0000 mg | ORAL_TABLET | ORAL | 1 refills | Status: DC

## 2018-01-12 MED ORDER — LEVOTHYROXINE SODIUM 150 MCG PO TABS: 150.0000 ug | ORAL_TABLET | Freq: Every day | ORAL | 3 refills | Status: DC

## 2018-01-12 MED ORDER — LOSARTAN POTASSIUM 50 MG PO TABS: 50.0000 mg | ORAL_TABLET | Freq: Every day | ORAL | 3 refills | Status: DC

## 2018-01-12 MED ORDER — SIMVASTATIN 40 MG PO TABS: ORAL_TABLET | ORAL | 3 refills | Status: DC

## 2018-01-12 NOTE — Addendum Note (Signed)
Addended by: Nanci Pina on: 01/12/2018 10:38 AM   Modules accepted: Orders

## 2018-01-12 NOTE — Assessment & Plan Note (Signed)
Chronic, improved on victoza - continue.

## 2018-01-12 NOTE — Assessment & Plan Note (Signed)
Chronic, stable. Continue current regimen. 

## 2018-01-12 NOTE — Assessment & Plan Note (Addendum)
By CT. Continue aspirin, statin. Consider updated EKG.

## 2018-01-12 NOTE — Assessment & Plan Note (Signed)
Continue aspirin, statin.  

## 2018-01-12 NOTE — Assessment & Plan Note (Signed)
Will need to verify CPAP use.

## 2018-01-12 NOTE — Assessment & Plan Note (Signed)
Advanced directive: scanned 05/2017. Husband Lewis is HCPOA. Desires very limited CPR attempts, does not want any prolonged life suppory 

## 2018-01-12 NOTE — Assessment & Plan Note (Signed)
Continue protonix daily. 

## 2018-01-12 NOTE — Assessment & Plan Note (Signed)
By CT - continue advair

## 2018-01-12 NOTE — Patient Instructions (Addendum)
Prevnar today  If interested, check with pharmacy about new 2 shot shingles series (shingrix).  We will order mammogram at [facility] as you're overdue.  When you take diflucan, hold simvastatin for a day or two.  For spot on sole - probably corn. Treat with callus pad, regular moisturizing and may try pumice stone. If no better, return for recheck.   Health Maintenance, Female Adopting a healthy lifestyle and getting preventive care can go a long way to promote health and wellness. Talk with your health care provider about what schedule of regular examinations is right for you. This is a good chance for you to check in with your provider about disease prevention and staying healthy. In between checkups, there are plenty of things you can do on your own. Experts have done a lot of research about which lifestyle changes and preventive measures are most likely to keep you healthy. Ask your health care provider for more information. Weight and diet Eat a healthy diet  Be sure to include plenty of vegetables, fruits, low-fat dairy products, and lean protein.  Do not eat a lot of foods high in solid fats, added sugars, or salt.  Get regular exercise. This is one of the most important things you can do for your health. ? Most adults should exercise for at least 150 minutes each week. The exercise should increase your heart rate and make you sweat (moderate-intensity exercise). ? Most adults should also do strengthening exercises at least twice a week. This is in addition to the moderate-intensity exercise.  Maintain a healthy weight  Body mass index (BMI) is a measurement that can be used to identify possible weight problems. It estimates body fat based on height and weight. Your health care provider can help determine your BMI and help you achieve or maintain a healthy weight.  For females 15 years of age and older: ? A BMI below 18.5 is considered underweight. ? A BMI of 18.5 to 24.9 is  normal. ? A BMI of 25 to 29.9 is considered overweight. ? A BMI of 30 and above is considered obese.  Watch levels of cholesterol and blood lipids  You should start having your blood tested for lipids and cholesterol at 66 years of age, then have this test every 5 years.  You may need to have your cholesterol levels checked more often if: ? Your lipid or cholesterol levels are high. ? You are older than 66 years of age. ? You are at high risk for heart disease.  Cancer screening Lung Cancer  Lung cancer screening is recommended for adults 94-33 years old who are at high risk for lung cancer because of a history of smoking.  A yearly low-dose CT scan of the lungs is recommended for people who: ? Currently smoke. ? Have quit within the past 15 years. ? Have at least a 30-pack-year history of smoking. A pack year is smoking an average of one pack of cigarettes a day for 1 year.  Yearly screening should continue until it has been 15 years since you quit.  Yearly screening should stop if you develop a health problem that would prevent you from having lung cancer treatment.  Breast Cancer  Practice breast self-awareness. This means understanding how your breasts normally appear and feel.  It also means doing regular breast self-exams. Let your health care provider know about any changes, no matter how small.  If you are in your 20s or 30s, you should have a clinical breast  exam (CBE) by a health care provider every 1-3 years as part of a regular health exam.  If you are 68 or older, have a CBE every year. Also consider having a breast X-ray (mammogram) every year.  If you have a family history of breast cancer, talk to your health care provider about genetic screening.  If you are at high risk for breast cancer, talk to your health care provider about having an MRI and a mammogram every year.  Breast cancer gene (BRCA) assessment is recommended for women who have family members  with BRCA-related cancers. BRCA-related cancers include: ? Breast. ? Ovarian. ? Tubal. ? Peritoneal cancers.  Results of the assessment will determine the need for genetic counseling and BRCA1 and BRCA2 testing.  Cervical Cancer Your health care provider may recommend that you be screened regularly for cancer of the pelvic organs (ovaries, uterus, and vagina). This screening involves a pelvic examination, including checking for microscopic changes to the surface of your cervix (Pap test). You may be encouraged to have this screening done every 3 years, beginning at age 37.  For women ages 54-65, health care providers may recommend pelvic exams and Pap testing every 3 years, or they may recommend the Pap and pelvic exam, combined with testing for human papilloma virus (HPV), every 5 years. Some types of HPV increase your risk of cervical cancer. Testing for HPV may also be done on women of any age with unclear Pap test results.  Other health care providers may not recommend any screening for nonpregnant women who are considered low risk for pelvic cancer and who do not have symptoms. Ask your health care provider if a screening pelvic exam is right for you.  If you have had past treatment for cervical cancer or a condition that could lead to cancer, you need Pap tests and screening for cancer for at least 20 years after your treatment. If Pap tests have been discontinued, your risk factors (such as having a new sexual partner) need to be reassessed to determine if screening should resume. Some women have medical problems that increase the chance of getting cervical cancer. In these cases, your health care provider may recommend more frequent screening and Pap tests.  Colorectal Cancer  This type of cancer can be detected and often prevented.  Routine colorectal cancer screening usually begins at 66 years of age and continues through 66 years of age.  Your health care provider may recommend  screening at an earlier age if you have risk factors for colon cancer.  Your health care provider may also recommend using home test kits to check for hidden blood in the stool.  A small camera at the end of a tube can be used to examine your colon directly (sigmoidoscopy or colonoscopy). This is done to check for the earliest forms of colorectal cancer.  Routine screening usually begins at age 9.  Direct examination of the colon should be repeated every 5-10 years through 66 years of age. However, you may need to be screened more often if early forms of precancerous polyps or small growths are found.  Skin Cancer  Check your skin from head to toe regularly.  Tell your health care provider about any new moles or changes in moles, especially if there is a change in a mole's shape or color.  Also tell your health care provider if you have a mole that is larger than the size of a pencil eraser.  Always use sunscreen. Apply sunscreen  liberally and repeatedly throughout the day.  Protect yourself by wearing long sleeves, pants, a wide-brimmed hat, and sunglasses whenever you are outside.  Heart disease, diabetes, and high blood pressure  High blood pressure causes heart disease and increases the risk of stroke. High blood pressure is more likely to develop in: ? People who have blood pressure in the high end of the normal range (130-139/85-89 mm Hg). ? People who are overweight or obese. ? People who are African American.  If you are 45-77 years of age, have your blood pressure checked every 3-5 years. If you are 58 years of age or older, have your blood pressure checked every year. You should have your blood pressure measured twice-once when you are at a hospital or clinic, and once when you are not at a hospital or clinic. Record the average of the two measurements. To check your blood pressure when you are not at a hospital or clinic, you can use: ? An automated blood pressure machine at  a pharmacy. ? A home blood pressure monitor.  If you are between 72 years and 68 years old, ask your health care provider if you should take aspirin to prevent strokes.  Have regular diabetes screenings. This involves taking a blood sample to check your fasting blood sugar level. ? If you are at a normal weight and have a low risk for diabetes, have this test once every three years after 66 years of age. ? If you are overweight and have a high risk for diabetes, consider being tested at a younger age or more often. Preventing infection Hepatitis B  If you have a higher risk for hepatitis B, you should be screened for this virus. You are considered at high risk for hepatitis B if: ? You were born in a country where hepatitis B is common. Ask your health care provider which countries are considered high risk. ? Your parents were born in a high-risk country, and you have not been immunized against hepatitis B (hepatitis B vaccine). ? You have HIV or AIDS. ? You use needles to inject street drugs. ? You live with someone who has hepatitis B. ? You have had sex with someone who has hepatitis B. ? You get hemodialysis treatment. ? You take certain medicines for conditions, including cancer, organ transplantation, and autoimmune conditions.  Hepatitis C  Blood testing is recommended for: ? Everyone born from [birth year] through 1964. ? Anyone with known risk factors for hepatitis C.  Sexually transmitted infections (STIs)  You should be screened for sexually transmitted infections (STIs) including gonorrhea and chlamydia if: ? You are sexually active and are younger than 66 years of age. ? You are older than 66 years of age and your health care provider tells you that you are at risk for this type of infection. ? Your sexual activity has changed since you were last screened and you are at an increased risk for chlamydia or gonorrhea. Ask your health care provider if you are at risk.  If you do  not have HIV, but are at risk, it may be recommended that you take a prescription medicine daily to prevent HIV infection. This is called pre-exposure prophylaxis (PrEP). You are considered at risk if: ? You are sexually active and do not regularly use condoms or know the HIV status of your partner(s). ? You take drugs by injection. ? You are sexually active with a partner who has HIV.  Talk with your health care provider about  whether you are at high risk of being infected with HIV. If you choose to begin PrEP, you should first be tested for HIV. You should then be tested every 3 months for as long as you are taking PrEP. Pregnancy  If you are premenopausal and you may become pregnant, ask your health care provider about preconception counseling.  If you may become pregnant, take 400 to 800 micrograms (mcg) of folic acid every day.  If you want to prevent pregnancy, talk to your health care provider about birth control (contraception). Osteoporosis and menopause  Osteoporosis is a disease in which the bones lose minerals and strength with aging. This can result in serious bone fractures. Your risk for osteoporosis can be identified using a bone density scan.  If you are 80 years of age or older, or if you are at risk for osteoporosis and fractures, ask your health care provider if you should be screened.  Ask your health care provider whether you should take a calcium or vitamin D supplement to lower your risk for osteoporosis.  Menopause may have certain physical symptoms and risks.  Hormone replacement therapy may reduce some of these symptoms and risks. Talk to your health care provider about whether hormone replacement therapy is right for you. Follow these instructions at home:  Schedule regular health, dental, and eye exams.  Stay current with your immunizations.  Do not use any tobacco products including cigarettes, chewing tobacco, or electronic cigarettes.  If you are  pregnant, do not drink alcohol.  If you are breastfeeding, limit how much and how often you drink alcohol.  Limit alcohol intake to no more than 1 drink per day for nonpregnant women. One drink equals 12 ounces of beer, 5 ounces of wine, or 1 ounces of hard liquor.  Do not use street drugs.  Do not share needles.  Ask your health care provider for help if you need support or information about quitting drugs.  Tell your health care provider if you often feel depressed.  Tell your health care provider if you have ever been abused or do not feel safe at home. This information is not intended to replace advice given to you by your health care provider. Make sure you discuss any questions you have with your health care provider. Document Released: 03/31/2011 Document Revised: 02/21/2016 Document Reviewed: 06/19/2015 Elsevier Interactive Patient Education  [employer].

## 2018-01-12 NOTE — Assessment & Plan Note (Signed)
Encouraged ongoing weight loss efforts. She is following ketogenic diet. Labs stable. Continue victoza.

## 2018-01-12 NOTE — Assessment & Plan Note (Signed)
Continue current meds, followed by psych

## 2018-01-12 NOTE — Assessment & Plan Note (Signed)
Undergoing lung cancer screening CT.  

## 2018-01-12 NOTE — ACP (Advance Care Planning) (Signed)
Advanced directive: scanned 05/2017. Husband Lewis is HCPOA. Desires very limited CPR attempts, does not want any prolonged life suppory 

## 2018-01-12 NOTE — Progress Notes (Signed)
BP (!) 144/78 (BP Location: Right Arm, Cuff Size: Large)   Pulse 71   Temp 97.6 F (36.4 C) (Oral)   Wt 296 lb 8 oz (134.5 kg)   SpO2 95%   BMI 51.70 kg/m    CC: AMW f/u visit Subjective:    Patient ID: Amy Barnes, female    DOB: 10/09/1950, 67 y.o.   MRN: [id number]  HPI: [name] is a 67 y.o. female presenting on 01/12/2018 for Medicare Wellness (Part 2. Wants to discuss knot on foot.) and Allergies Larey Seat Saturday and believes it is allergy related from sinus pressure and neck pain.)   Saw Lesia last week for medicare wellness visit. Note reviewed.   Just had L CTR by Dr Merlyn Lot. Healing well.  bp and home cbg's elevated - taking sinus/cold meds.   Preventative: COLONOSCOPY WITH PROPOFOL 12/31/2015; diverticulosis, int hem, o/w normal rpt 10 yrs ([facility]) Lung cancer screening - ex smoker, quit 2010. ~40 PY hx. Has completed 2 LRCT scans.  Breast cancer screening - overdue, agrees to reschedule at [facility]. Doesn't do breast exams at home.  Well woman exam - h/o partial hysterectomy for menorrhagia, ovaries remain. no pelvic pain/pressure or bleeding. DEXA scan - not done previously. Will defer for now.  Flu shot - yearly Td - 2008 Pneumovax 2013, prevnar today zostavax - 2015 shingrix - dicsussed Advanced directive discussion - has for [place]. Wants husband then son to be HCPOA.  Seat belt use discussed Sunscreen use discussed. No changing moles on skin.  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 Interim medical history since our last visit reviewed. Allergies and medications reviewed and updated. Outpatient Medications Prior to Visit  Medication Sig Dispense Refill  . albuterol (PROVENTIL HFA;VENTOLIN  HFA) 108 (90 BASE) MCG/ACT inhaler Inhale 2 puffs into the lungs every 6 (six) hours as needed for wheezing or shortness of breath. Reported on 04/17/2016    . aspirin 81 MG EC tablet Take 81 mg by mouth at bedtime.     . BD PEN NEEDLE NANO U/F 32G X 4 MM MISC USE PEN NEEDLE TO ADMINISTER VICTOZA ONCE DAILY AND AS DIRECTED. DX. E11.42 100 each 1  . doxycycline (VIBRA-TABS) 100 MG tablet TAKE 1 TABLET (100 MG TOTAL) BY MOUTH AT BEDTIME. REPORTED ON 12/31/2015 90 tablet 2  . Fluticasone-Salmeterol (ADVAIR) 250-50 MCG/DOSE AEPB Inhale 1 puff into the lungs 2 (two) times daily.    Marland Kitchen lamoTRIgine (LAMICTAL) 200 MG tablet Take 200 mg by mouth daily.      . meloxicam (MOBIC) 15 MG tablet TAKE 0.5-1 TABLETS (7.5-15 MG TOTAL) BY MOUTH DAILY. AS NEEDED 30 tablet 3  . montelukast (SINGULAIR) 10 MG tablet TAKE 1 TABLET (10 MG TOTAL) BY MOUTH AT BEDTIME. 90 tablet 0  . Multiple Vitamin (MULTIVITAMIN WITH MINERALS) TABS tablet Take 2 tablets by mouth daily.     . ondansetron (ZOFRAN) 8 MG tablet Take 8 mg by mouth every 8 (eight) hours as needed for nausea or vomiting.     . Oxcarbazepine (TRILEPTAL) 300 MG tablet Take 300-600 mg by mouth 2 (two) times daily. Pt takes one tablet in the morning and two in the evening.    Marland Kitchen oxyCODONE-acetaminophen (PERCOCET) 5-325 MG tablet 1-2 tabs po q6 hours  prn pain 20 tablet 0  . promethazine (PHENERGAN) 25 MG tablet Take 25 mg by mouth every 6 (six) hours as needed for nausea or vomiting.    Marland Kitchen VICTOZA 18 MG/3ML SOPN INJECT 0.3 MLS (1.8 MG TOTAL) INTO THE SKIN AT BEDTIME. 27 mL 3  . fluconazole (DIFLUCAN) 150 MG tablet Take 150 mg by mouth See admin instructions. As needed for yeast infection take 150mg  once then repeat in 72 hours.    Marland Kitchen levothyroxine (SYNTHROID, LEVOTHROID) 150 MCG tablet Take 1 tablet (150 mcg total) by mouth at bedtime. 90 tablet 3  . losartan (COZAAR) 50 MG tablet TAKE 1 TABLET (50 MG TOTAL) BY MOUTH DAILY. 90 tablet 0  . pantoprazole (PROTONIX) 40 MG tablet  TAKE 1 TABLET (40 MG TOTAL) BY MOUTH DAILY. 90 tablet 0  . simvastatin (ZOCOR) 40 MG tablet TAKE 1 TABLET BY MOUTH EVERY DAY IN THE EVENING 90 tablet 0  . cyclobenzaprine (FLEXERIL) 10 MG tablet Take 1 tablet (10 mg total) by mouth 3 (three) times daily as needed for muscle spasms. (Patient not taking: Reported on 01/12/2018) 50 tablet 1  . furosemide (LASIX) 40 MG tablet Take 1 tablet (40 mg total) by mouth daily as needed.    . zaleplon (SONATA) 10 MG capsule Take 10 mg by mouth at bedtime as needed for sleep. Reported on 04/17/2016     No facility-administered medications prior to visit.      Per HPI unless specifically indicated in ROS section below Review of Systems     Objective:    BP (!) 144/78 (BP Location: Right Arm, Cuff Size: Large)   Pulse 71   Temp 97.6 F (36.4 C) (Oral)   Wt 296 lb 8 oz (134.5 kg)   SpO2 95%   BMI 51.70 kg/m   Wt Readings from Last 3 Encounters:  01/12/18 296 lb 8 oz (134.5 kg)  01/05/18 289 lb 8 oz (131.3 kg)  12/24/17 280 lb 8 oz (127.2 kg)    Physical Exam  Constitutional: She is oriented to person, place, and time. She appears well-developed and well-nourished. No distress.  HENT:  Head: Normocephalic and atraumatic.  Right Ear: Hearing, tympanic membrane, external ear and ear canal normal.  Left Ear: Hearing, tympanic membrane, external ear and ear canal normal.  Nose: Nose normal.  Mouth/Throat: Uvula is midline, oropharynx is clear and moist and mucous membranes are normal. No oropharyngeal exudate, posterior oropharyngeal edema or posterior oropharyngeal erythema.  Eyes: Pupils are equal, round, and reactive to light. Conjunctivae and EOM are normal. No scleral icterus.  Neck: Normal range of motion. Neck supple. Carotid bruit is not present. No thyromegaly present.  Cardiovascular: Normal rate, regular rhythm, normal heart sounds and intact distal pulses.  No murmur heard. Pulses:      Radial pulses are 2+ on the right side, and 2+ on  the left side.  Pulmonary/Chest: Effort normal and breath sounds normal. No respiratory distress. She has no wheezes. She has no rales.  Abdominal: Soft. Bowel sounds are normal. She exhibits no distension and no mass. There is no tenderness. There is no rebound and no guarding.  Musculoskeletal: Normal range of motion. She exhibits no edema.  Tender corn on R lateral sole Wrist brace L wrist after recent CTR  Lymphadenopathy:    She has no cervical adenopathy.  Neurological: She is alert and oriented to person, place, and time.  CN grossly intact, station intact Antalgic gait, walks with cane  Skin: Skin is warm  and dry. No rash noted.  Psychiatric: She has a normal mood and affect. Her behavior is normal. Judgment and thought content normal.  Nursing note and vitals reviewed.  Results for orders placed or performed in visit on 01/05/18  CBC with Differential/Platelet  Result Value Ref Range   WBC 6.0 4.0 - 10.5 K/uL   RBC 4.39 3.87 - 5.11 Mil/uL   Hemoglobin 13.0 12.0 - 15.0 g/dL   HCT 40.9 81.1 - 91.4 %   MCV 88.3 78.0 - 100.0 fl   MCHC 33.5 30.0 - 36.0 g/dL   RDW 78.2 (H) 95.6 - 21.3 %   Platelets 244.0 150.0 - 400.0 K/uL   Neutrophils Relative % 59.0 43.0 - 77.0 %   Lymphocytes Relative 27.7 12.0 - 46.0 %   Monocytes Relative 9.4 3.0 - 12.0 %   Eosinophils Relative 3.0 0.0 - 5.0 %   Basophils Relative 0.9 0.0 - 3.0 %   Neutro Abs 3.6 1.4 - 7.7 K/uL   Lymphs Abs 1.7 0.7 - 4.0 K/uL   Monocytes Absolute 0.6 0.1 - 1.0 K/uL   Eosinophils Absolute 0.2 0.0 - 0.7 K/uL   Basophils Absolute 0.1 0.0 - 0.1 K/uL  T4, free  Result Value Ref Range   Free T4 0.74 0.60 - 1.60 ng/dL  TSH  Result Value Ref Range   TSH 1.00 0.35 - 4.50 uIU/mL  Hemoglobin A1c  Result Value Ref Range   Hgb A1c MFr Bld 5.5 4.6 - 6.5 %  Lipid panel  Result Value Ref Range   Cholesterol 168 0 - 200 mg/dL   Triglycerides 08.6 0.0 - 149.0 mg/dL   HDL 57.84 >69.62 mg/dL   VLDL 8.6 0.0 - 95.2 mg/dL   LDL  Cholesterol 81 0 - 99 mg/dL   Total CHOL/HDL Ratio 2    NonHDL 89.85   HIV antibody (with reflex)  Result Value Ref Range   HIV 1&2 Ab, 4th Generation NON-REACTIVE NON-REACTI      Assessment & Plan:   Problem List Items Addressed This Visit    Abdominal aortic atherosclerosis (HCC)    Continue aspirin, statin.      Relevant Medications   losartan (COZAAR) 50 MG tablet   simvastatin (ZOCOR) 40 MG tablet   Advanced care planning/counseling discussion - Primary    Advanced directive: scanned 05/2017. Husband Melvyn Neth is HCPOA. Desires very limited CPR attempts, does not want any prolonged life suppory      Asthma    Encouraged restart advair around allergy season       Bipolar disorder (HCC)    Continue current meds, followed by psych      BMI 50.0-59.9, adult (HCC)    Encouraged ongoing weight loss efforts. She is following ketogenic diet. Labs stable. Continue victoza.       CAD (coronary artery disease)    By CT. Continue aspirin, statin. Consider updated EKG.       Relevant Medications   losartan (COZAAR) 50 MG tablet   simvastatin (ZOCOR) 40 MG tablet   Centrilobular emphysema (HCC)    By CT - continue advair      DM type 2 with diabetic peripheral neuropathy (HCC)    Chronic, improved on victoza - continue.       Relevant Medications   losartan (COZAAR) 50 MG tablet   simvastatin (ZOCOR) 40 MG tablet   Essential hypertension    Chronic, stable. Continue current regimen.       Relevant Medications   losartan (COZAAR) 50  MG tablet   simvastatin (ZOCOR) 40 MG tablet   Ex-smoker    Undergoing lung cancer screening CT      GERD    Continue protonix daily.       Relevant Medications   pantoprazole (PROTONIX) 40 MG tablet   HYPERCHOLESTEROLEMIA    Chronic, stable. Continue statin. The 10-year ASCVD risk score Denman George DC Montez Hageman., et al., 2013) is: 13.8%   Values used to calculate the score:     Age: 13 years     Sex: Female     Is Non-Hispanic African  American: No     Diabetic: Yes     Tobacco smoker: No     Systolic Blood Pressure: 144 mmHg     Is BP treated: Yes     HDL Cholesterol: 77.9 mg/dL     Total Cholesterol: 168 mg/dL       Relevant Medications   losartan (COZAAR) 50 MG tablet   simvastatin (ZOCOR) 40 MG tablet   Hypothyroidism    Chronic, stable. Continue current regimen.       Relevant Medications   levothyroxine (SYNTHROID, LEVOTHROID) 150 MCG tablet   Osteoarthritis   PAH (pulmonary artery hypertension) (HCC)    Will need to verify CPAP use.       Relevant Medications   losartan (COZAAR) 50 MG tablet   simvastatin (ZOCOR) 40 MG tablet   Thoracic aortic atherosclerosis (HCC)    Continue aspirin, statin.       Relevant Medications   losartan (COZAAR) 50 MG tablet   simvastatin (ZOCOR) 40 MG tablet       Meds ordered this encounter  Medications  . losartan (COZAAR) 50 MG tablet    Sig: Take 1 tablet (50 mg total) by mouth daily.    Dispense:  90 tablet    Refill:  3  . fluconazole (DIFLUCAN) 150 MG tablet    Sig: Take 1 tablet (150 mg total) by mouth See admin instructions. As needed for yeast infection take 150mg  once then repeat in 72 hours.    Dispense:  2 tablet    Refill:  1  . pantoprazole (PROTONIX) 40 MG tablet    Sig: Take 1 tablet (40 mg total) by mouth daily.    Dispense:  90 tablet    Refill:  3  . simvastatin (ZOCOR) 40 MG tablet    Sig: TAKE 1 TABLET BY MOUTH EVERY DAY IN THE EVENING    Dispense:  90 tablet    Refill:  3  . levothyroxine (SYNTHROID, LEVOTHROID) 150 MCG tablet    Sig: Take 1 tablet (150 mcg total) by mouth at bedtime.    Dispense:  90 tablet    Refill:  3   No orders of the defined types were placed in this encounter.   Follow up plan: Return in about 6 months (around 07/14/2018), or if symptoms worsen or fail to improve, for follow up visit.  [name], MD

## 2018-01-12 NOTE — Assessment & Plan Note (Signed)
Chronic, stable. Continue statin. The 10-year ASCVD risk score Denman George DC Montez Hageman., et al., 2013) is: 13.8%   Values used to calculate the score:     Age: 67 years     Sex: Female     Is Non-Hispanic African American: No     Diabetic: Yes     Tobacco smoker: No     Systolic Blood Pressure: 144 mmHg     Is BP treated: Yes     HDL Cholesterol: 77.9 mg/dL     Total Cholesterol: 168 mg/dL

## 2018-01-12 NOTE — Assessment & Plan Note (Addendum)
Encouraged restart advair around allergy season

## 2018-01-16 ENCOUNTER — Encounter: Payer: Self-pay | Admitting: Family Medicine

## 2018-01-16 DIAGNOSIS — T50A95A Adverse effect of other bacterial vaccines, initial encounter: Secondary | ICD-10-CM

## 2018-01-16 HISTORY — DX: Adverse effect of other bacterial vaccines, initial encounter: T50.A95A

## 2018-01-19 DIAGNOSIS — F3181 Bipolar II disorder: Secondary | ICD-10-CM | POA: Diagnosis not present

## 2018-02-26 ENCOUNTER — Encounter: Payer: Self-pay | Admitting: Family Medicine

## 2018-02-26 DIAGNOSIS — R030 Elevated blood-pressure reading, without diagnosis of hypertension: Secondary | ICD-10-CM | POA: Diagnosis not present

## 2018-02-26 DIAGNOSIS — M4316 Spondylolisthesis, lumbar region: Secondary | ICD-10-CM | POA: Diagnosis not present

## 2018-02-26 DIAGNOSIS — Z6841 Body Mass Index (BMI) 40.0 and over, adult: Secondary | ICD-10-CM | POA: Diagnosis not present

## 2018-03-07 ENCOUNTER — Other Ambulatory Visit: Payer: Self-pay | Admitting: Family Medicine

## 2018-03-14 ENCOUNTER — Other Ambulatory Visit: Payer: Self-pay | Admitting: Family Medicine

## 2018-03-15 NOTE — Telephone Encounter (Signed)
Electronic refill request Last office visit 01/12/18 Last refill #30/3

## 2018-04-20 DIAGNOSIS — M1812 Unilateral primary osteoarthritis of first carpometacarpal joint, left hand: Secondary | ICD-10-CM | POA: Diagnosis not present

## 2018-06-24 ENCOUNTER — Encounter: Payer: Self-pay | Admitting: Family Medicine

## 2018-07-06 ENCOUNTER — Other Ambulatory Visit: Payer: Medicare Other

## 2018-07-08 ENCOUNTER — Other Ambulatory Visit: Payer: Self-pay | Admitting: Family Medicine

## 2018-07-08 DIAGNOSIS — E7800 Pure hypercholesterolemia, unspecified: Secondary | ICD-10-CM

## 2018-07-08 DIAGNOSIS — E039 Hypothyroidism, unspecified: Secondary | ICD-10-CM

## 2018-07-08 DIAGNOSIS — E1142 Type 2 diabetes mellitus with diabetic polyneuropathy: Secondary | ICD-10-CM

## 2018-07-08 DIAGNOSIS — F3170 Bipolar disorder, currently in remission, most recent episode unspecified: Secondary | ICD-10-CM

## 2018-07-09 ENCOUNTER — Other Ambulatory Visit (INDEPENDENT_AMBULATORY_CARE_PROVIDER_SITE_OTHER): Payer: Medicare Other

## 2018-07-09 DIAGNOSIS — E7800 Pure hypercholesterolemia, unspecified: Secondary | ICD-10-CM | POA: Diagnosis not present

## 2018-07-09 DIAGNOSIS — F3170 Bipolar disorder, currently in remission, most recent episode unspecified: Secondary | ICD-10-CM | POA: Diagnosis not present

## 2018-07-09 DIAGNOSIS — E1142 Type 2 diabetes mellitus with diabetic polyneuropathy: Secondary | ICD-10-CM | POA: Diagnosis not present

## 2018-07-09 DIAGNOSIS — E039 Hypothyroidism, unspecified: Secondary | ICD-10-CM

## 2018-07-09 LAB — CBC WITH DIFFERENTIAL/PLATELET
Basophils Absolute: 0 10*3/uL (ref 0.0–0.1)
Basophils Relative: 0.7 % (ref 0.0–3.0)
Eosinophils Absolute: 0.2 10*3/uL (ref 0.0–0.7)
Eosinophils Relative: 3.3 % (ref 0.0–5.0)
HCT: 37.3 % (ref 36.0–46.0)
Hemoglobin: 12.5 g/dL (ref 12.0–15.0)
Lymphocytes Relative: 35.2 % (ref 12.0–46.0)
Lymphs Abs: 1.9 10*3/uL (ref 0.7–4.0)
MCHC: 33.6 g/dL (ref 30.0–36.0)
MCV: 87.7 fl (ref 78.0–100.0)
Monocytes Absolute: 0.5 10*3/uL (ref 0.1–1.0)
Monocytes Relative: 8.7 % (ref 3.0–12.0)
Neutro Abs: 2.8 10*3/uL (ref 1.4–7.7)
Neutrophils Relative %: 52.1 % (ref 43.0–77.0)
Platelets: 238 10*3/uL (ref 150.0–400.0)
RBC: 4.25 Mil/uL (ref 3.87–5.11)
RDW: 14.9 % (ref 11.5–15.5)
WBC: 5.4 10*3/uL (ref 4.0–10.5)

## 2018-07-09 LAB — COMPREHENSIVE METABOLIC PANEL
ALT: 19 U/L (ref 0–35)
AST: 17 U/L (ref 0–37)
Albumin: 4.3 g/dL (ref 3.5–5.2)
Alkaline Phosphatase: 54 U/L (ref 39–117)
BUN: 25 mg/dL — ABNORMAL HIGH (ref 6–23)
CO2: 29 mEq/L (ref 19–32)
Calcium: 9.7 mg/dL (ref 8.4–10.5)
Chloride: 102 mEq/L (ref 96–112)
Creatinine, Ser: 0.62 mg/dL (ref 0.40–1.20)
GFR: 102.33 mL/min (ref >60.00)
Glucose, Bld: 128 mg/dL — ABNORMAL HIGH (ref 70–99)
Potassium: 4.8 mEq/L (ref 3.5–5.1)
Sodium: 138 mEq/L (ref 135–145)
Total Bilirubin: 0.4 mg/dL (ref 0.2–1.2)
Total Protein: 7 g/dL (ref 6.0–8.3)

## 2018-07-09 LAB — LIPID PANEL
Cholesterol: 135 mg/dL (ref 0–200)
HDL: 72.7 mg/dL (ref >39.00)
LDL Cholesterol: 52 mg/dL (ref 0–99)
NonHDL: 62.44
Total CHOL/HDL Ratio: 2
Triglycerides: 50 mg/dL (ref 0.0–149.0)
VLDL: 10 mg/dL (ref 0.0–40.0)

## 2018-07-09 LAB — HEMOGLOBIN A1C: Hgb A1c MFr Bld: 5.8 % (ref 4.6–6.5)

## 2018-07-09 LAB — TSH: TSH: 1.24 u[IU]/mL (ref 0.35–4.50)

## 2018-07-12 ENCOUNTER — Ambulatory Visit (INDEPENDENT_AMBULATORY_CARE_PROVIDER_SITE_OTHER): Payer: Medicare Other | Admitting: Family Medicine

## 2018-07-12 ENCOUNTER — Encounter: Payer: Self-pay | Admitting: Family Medicine

## 2018-07-12 VITALS — BP 140/84 | HR 77 | Temp 97.8°F | Ht 63.5 in | Wt 314.0 lb

## 2018-07-12 DIAGNOSIS — E039 Hypothyroidism, unspecified: Secondary | ICD-10-CM | POA: Diagnosis not present

## 2018-07-12 DIAGNOSIS — N898 Other specified noninflammatory disorders of vagina: Secondary | ICD-10-CM | POA: Diagnosis not present

## 2018-07-12 DIAGNOSIS — E1142 Type 2 diabetes mellitus with diabetic polyneuropathy: Secondary | ICD-10-CM | POA: Diagnosis not present

## 2018-07-12 DIAGNOSIS — I2510 Atherosclerotic heart disease of native coronary artery without angina pectoris: Secondary | ICD-10-CM | POA: Diagnosis not present

## 2018-07-12 DIAGNOSIS — Z6841 Body Mass Index (BMI) 40.0 and over, adult: Secondary | ICD-10-CM | POA: Diagnosis not present

## 2018-07-12 DIAGNOSIS — E7800 Pure hypercholesterolemia, unspecified: Secondary | ICD-10-CM | POA: Diagnosis not present

## 2018-07-12 DIAGNOSIS — I10 Essential (primary) hypertension: Secondary | ICD-10-CM | POA: Diagnosis not present

## 2018-07-12 DIAGNOSIS — Z23 Encounter for immunization: Secondary | ICD-10-CM

## 2018-07-12 DIAGNOSIS — M1990 Unspecified osteoarthritis, unspecified site: Secondary | ICD-10-CM | POA: Diagnosis not present

## 2018-07-12 MED ORDER — DOXYCYCLINE HYCLATE 100 MG PO TABS: 100.0000 mg | ORAL_TABLET | Freq: Every day | ORAL | 1 refills | Status: DC

## 2018-07-12 MED ORDER — LIRAGLUTIDE 18 MG/3ML ~~LOC~~ SOPN: 1.8000 mg | PEN_INJECTOR | Freq: Every day | SUBCUTANEOUS | 3 refills | Status: DC

## 2018-07-12 MED ORDER — FLUCONAZOLE 150 MG PO TABS: 150.0000 mg | ORAL_TABLET | ORAL | 1 refills | Status: DC

## 2018-07-12 MED ORDER — CELECOXIB 200 MG PO CAPS: 200.0000 mg | ORAL_CAPSULE | Freq: Every day | ORAL | 1 refills | Status: DC

## 2018-07-12 NOTE — Assessment & Plan Note (Signed)
Rx presumptively for yeast infection. Update if ongoing symptoms.

## 2018-07-12 NOTE — Addendum Note (Signed)
Addended by: Nanci Pina on: 07/12/2018 09:15 AM   Modules accepted: Orders

## 2018-07-12 NOTE — Assessment & Plan Note (Signed)
Chronic, deteriorated. Anticipate weight and weather affecting worsening OA pain. She will continue celebrex (more effective than meloxicam) and will work towards weight loss - see below.

## 2018-07-12 NOTE — Assessment & Plan Note (Signed)
Well controlled - continue simvastatin.

## 2018-07-12 NOTE — Progress Notes (Signed)
BP 140/84 (BP Location: Right Arm, Patient Position: Sitting, Cuff Size: Normal) Comment (Cuff Size): on lower right arm  Pulse 77   Temp 97.8 F (36.6 C) (Oral)   Ht 5' 3.5" (1.613 m)   Wt (!) 314 lb (142.4 kg)   SpO2 96%   BMI 54.75 kg/m    CC: 6 mo f/u visit Subjective:    Patient ID: Amy Barnes, female    DOB: 19-Oct-1950, 67 y.o.   MRN: [id number]  HPI: Amy Barnes is a 67 y.o. female presenting on 07/12/2018 for 6 mo follow up (Wants to discuss Celebrex.)   Struggling with brother's death (1 yr ago)  Increasing pain noted - attributes to walking and weather change. Recent trip to mountains. Dr Merlyn Lot started her on celebrex - she would like [place] to refill this. She has also started using magnetic rings.   Some vaginal itching with some discharge ongoing for the past month.   DM - does not regularly check sugars. Compliant with antihyperglycemic regimen which includes: victoza - but ran out 2 months ago and it was not refilled. Denies low sugars or hypoglycemic symptoms. Denies paresthesias. Last diabetic eye exam 06/2017. Pneumovax: 2013. Prevnar: 12/2017. Glucometer brand: [facility] brand. DSME: has not completed - interested. No fmhx thyroid cancer.  Lab Results  Component Value Date   HGBA1C 5.8 07/09/2018   Diabetic Foot Exam - Simple   Simple Foot Form Diabetic Foot exam was performed with the following findings:  Yes 07/12/2018  8:34 AM  Visual Inspection See comments:  Yes Sensation Testing Intact to touch and monofilament testing bilaterally:  Yes Pulse Check Posterior Tibialis and Dorsalis pulse intact bilaterally:  Yes Comments Callus formation L foot > R foot    Lab Results  Component Value Date   MICROALBUR 0.3 08/19/2010     Relevant past medical, surgical, family and social history reviewed and updated as indicated. Interim medical history since our last visit reviewed. Allergies and medications reviewed and updated. Outpatient Medications Prior to  Visit  Medication Sig Dispense Refill  . albuterol (PROVENTIL HFA;VENTOLIN HFA) 108 (90 BASE) MCG/ACT inhaler Inhale 2 puffs into the lungs every 6 (six) hours as needed for wheezing or shortness of breath. Reported on 04/17/2016    . aspirin 81 MG EC tablet Take 81 mg by mouth at bedtime.     . cyclobenzaprine (FLEXERIL) 10 MG tablet Take 1 tablet (10 mg total) by mouth 3 (three) times daily as needed for muscle spasms. 50 tablet 1  . Fluticasone-Salmeterol (ADVAIR) 250-50 MCG/DOSE AEPB Inhale 1 puff into the lungs 2 (two) times daily.    . furosemide (LASIX) 40 MG tablet Take 1 tablet (40 mg total) by mouth daily as needed.    . lamoTRIgine (LAMICTAL) 200 MG tablet Take 200 mg by mouth daily.      Marland Kitchen levothyroxine (SYNTHROID, LEVOTHROID) 150 MCG tablet Take 1 tablet (150 mcg total) by mouth at bedtime. 90 tablet 3  . losartan (COZAAR) 50 MG tablet Take 1 tablet (50 mg total) by mouth daily. 90 tablet 3  . montelukast (SINGULAIR) 10 MG tablet TAKE 1 TABLET BY MOUTH EVERYDAY AT BEDTIME 90 tablet 3  . Multiple Vitamin (MULTIVITAMIN WITH MINERALS) TABS tablet Take 2 tablets by mouth daily.     . ondansetron (ZOFRAN) 8 MG tablet Take 8 mg by mouth every 8 (eight) hours as needed for nausea or vomiting.     . Oxcarbazepine (TRILEPTAL) 300 MG tablet Take 300-600 mg  by mouth 2 (two) times daily. Pt takes one tablet in the morning and two in the evening.    Marland Kitchen oxyCODONE-acetaminophen (PERCOCET) 5-325 MG tablet 1-2 tabs po q6 hours prn pain 20 tablet 0  . pantoprazole (PROTONIX) 40 MG tablet Take 1 tablet (40 mg total) by mouth daily. 90 tablet 3  . promethazine (PHENERGAN) 25 MG tablet Take 25 mg by mouth every 6 (six) hours as needed for nausea or vomiting.    . simvastatin (ZOCOR) 40 MG tablet TAKE 1 TABLET BY MOUTH EVERY DAY IN THE EVENING 90 tablet 3  . zaleplon (SONATA) 10 MG capsule Take 10 mg by mouth at bedtime as needed for sleep. Reported on 04/17/2016    . celecoxib (CELEBREX) 200 MG capsule Take  1 capsule by mouth daily.    . celecoxib (CELEBREX) 200 MG capsule Take 1 capsule (200 mg total) by mouth daily.    Marland Kitchen doxycycline (VIBRA-TABS) 100 MG tablet TAKE 1 TABLET (100 MG TOTAL) BY MOUTH AT BEDTIME. REPORTED ON 12/31/2015 90 tablet 2  . fluconazole (DIFLUCAN) 150 MG tablet Take 1 tablet (150 mg total) by mouth See admin instructions. As needed for yeast infection take 150mg  once then repeat in 72 hours. 2 tablet 1  . BD PEN NEEDLE NANO U/F 32G X 4 MM MISC USE PEN NEEDLE TO ADMINISTER VICTOZA ONCE DAILY AND AS DIRECTED. DX. E11.42 (Patient not taking: Reported on 07/12/2018) 100 each 1  . meloxicam (MOBIC) 15 MG tablet TAKE 0.5-1 TABLETS (7.5-15 MG TOTAL) BY MOUTH DAILY. AS NEEDED (Patient not taking: Reported on 07/12/2018) 30 tablet 2  . VICTOZA 18 MG/3ML SOPN INJECT 0.3 MLS (1.8 MG TOTAL) INTO THE SKIN AT BEDTIME. (Patient not taking: Reported on 07/12/2018) 27 mL 3   No facility-administered medications prior to visit.      Per HPI unless specifically indicated in ROS section below Review of Systems     Objective:    BP 140/84 (BP Location: Right Arm, Patient Position: Sitting, Cuff Size: Normal) Comment (Cuff Size): on lower right arm  Pulse 77   Temp 97.8 F (36.6 C) (Oral)   Ht 5' 3.5" (1.613 m)   Wt (!) 314 lb (142.4 kg)   SpO2 96%   BMI 54.75 kg/m   Wt Readings from Last 3 Encounters:  07/12/18 (!) 314 lb (142.4 kg)  01/12/18 296 lb 8 oz (134.5 kg)  01/05/18 289 lb 8 oz (131.3 kg)    Physical Exam  Constitutional: She appears well-developed and well-nourished. No distress.  HENT:  Head: Normocephalic and atraumatic.  Right Ear: External ear normal.  Left Ear: External ear normal.  Nose: Nose normal.  Mouth/Throat: Oropharynx is clear and moist. No oropharyngeal exudate.  Eyes: Pupils are equal, round, and reactive to light. Conjunctivae and EOM are normal. No scleral icterus.  Neck: Normal range of motion. Neck supple.  Cardiovascular: Normal rate, regular  rhythm, normal heart sounds and intact distal pulses.  No murmur heard. Pulmonary/Chest: Effort normal and breath sounds normal. No respiratory distress. She has no wheezes. She has no rales.  Musculoskeletal: She exhibits no edema.  See HPI for foot exam if done  Lymphadenopathy:    She has no cervical adenopathy.  Skin: Skin is warm and dry. No rash noted.  Psychiatric: She has a normal mood and affect.  Nursing note and vitals reviewed.  Results for orders placed or performed in visit on 07/09/18  CBC with Differential/Platelet  Result Value Ref Range   WBC  5.4 4.0 - 10.5 K/uL   RBC 4.25 3.87 - 5.11 Mil/uL   Hemoglobin 12.5 12.0 - 15.0 g/dL   HCT 16.1 09.6 - 04.5 %   MCV 87.7 78.0 - 100.0 fl   MCHC 33.6 30.0 - 36.0 g/dL   RDW 40.9 81.1 - 91.4 %   Platelets 238.0 150.0 - 400.0 K/uL   Neutrophils Relative % 52.1 43.0 - 77.0 %   Lymphocytes Relative 35.2 12.0 - 46.0 %   Monocytes Relative 8.7 3.0 - 12.0 %   Eosinophils Relative 3.3 0.0 - 5.0 %   Basophils Relative 0.7 0.0 - 3.0 %   Neutro Abs 2.8 1.4 - 7.7 K/uL   Lymphs Abs 1.9 0.7 - 4.0 K/uL   Monocytes Absolute 0.5 0.1 - 1.0 K/uL   Eosinophils Absolute 0.2 0.0 - 0.7 K/uL   Basophils Absolute 0.0 0.0 - 0.1 K/uL  Hemoglobin A1c  Result Value Ref Range   Hgb A1c MFr Bld 5.8 4.6 - 6.5 %  TSH  Result Value Ref Range   TSH 1.24 0.35 - 4.50 uIU/mL  Comprehensive metabolic panel  Result Value Ref Range   Sodium 138 135 - 145 mEq/L   Potassium 4.8 3.5 - 5.1 mEq/L   Chloride 102 96 - 112 mEq/L   CO2 29 19 - 32 mEq/L   Glucose, Bld 128 (H) 70 - 99 mg/dL   BUN 25 (H) 6 - 23 mg/dL   Creatinine, Ser 7.82 0.40 - 1.20 mg/dL   Total Bilirubin 0.4 0.2 - 1.2 mg/dL   Alkaline Phosphatase 54 39 - 117 U/L   AST 17 0 - 37 U/L   ALT 19 0 - 35 U/L   Total Protein 7.0 6.0 - 8.3 g/dL   Albumin 4.3 3.5 - 5.2 g/dL   Calcium 9.7 8.4 - 95.6 mg/dL   GFR 213.08 >65.78 mL/min  Lipid panel  Result Value Ref Range   Cholesterol 135 0 - 200  mg/dL   Triglycerides 46.9 0.0 - 149.0 mg/dL   HDL 62.95 >28.41 mg/dL   VLDL 32.4 0.0 - 40.1 mg/dL   LDL Cholesterol 52 0 - 99 mg/dL   Total CHOL/HDL Ratio 2    NonHDL 62.44       Assessment & Plan:   Problem List Items Addressed This Visit    Vaginal itching    Rx presumptively for yeast infection. Update if ongoing symptoms.       Osteoarthritis    Chronic, deteriorated. Anticipate weight and weather affecting worsening OA pain. She will continue celebrex (more effective than meloxicam) and will work towards weight loss - see below.       Relevant Medications   celecoxib (CELEBREX) 200 MG capsule   Hypothyroidism    Chronic, stable. Continue current regimen.       HYPERCHOLESTEROLEMIA    Well controlled - continue simvastatin.       Essential hypertension    Mildly elevated today - no changes made.       DM type 2 with diabetic peripheral neuropathy (HCC) - Primary    Chronic, stable - but ran out of victoza and then states it was never refilled - I reviewed our chart and it was never requested. rec restart victoza daily.  She will check on insurance coverage for diabetes classes      Relevant Medications   liraglutide (VICTOZA) 18 MG/3ML SOPN   BMI 50.0-59.9, adult (HCC)    Weight gain noted (18lbs) - but she did run out  of victoza and it was never refilled. Discussed this. Will restart victoza - will likely help OA pain. RTC 6 mo f/u visit.       Relevant Medications   liraglutide (VICTOZA) 18 MG/3ML SOPN       Meds ordered this encounter  Medications  . doxycycline (VIBRA-TABS) 100 MG tablet    Sig: Take 1 tablet (100 mg total) by mouth at bedtime. Reported on 12/31/2015    Dispense:  90 tablet    Refill:  1  . celecoxib (CELEBREX) 200 MG capsule    Sig: Take 1 capsule (200 mg total) by mouth daily.    Dispense:  90 capsule    Refill:  1    Dr Merlyn Lot  . liraglutide (VICTOZA) 18 MG/3ML SOPN    Sig: Inject 0.3 mLs (1.8 mg total) into the skin at bedtime.      Dispense:  27 mL    Refill:  3  . fluconazole (DIFLUCAN) 150 MG tablet    Sig: Take 1 tablet (150 mg total) by mouth See admin instructions. As needed for yeast infection take 150mg  once then repeat in 72 hours.    Dispense:  2 tablet    Refill:  1   No orders of the defined types were placed in this encounter.   Follow up plan: Return in about 6 months (around 01/11/2019) for medicare wellness visit, follow up visit.  [name], MD

## 2018-07-12 NOTE — Assessment & Plan Note (Signed)
Weight gain noted (18lbs) - but she did run out of victoza and it was never refilled. Discussed this. Will restart victoza - will likely help OA pain. RTC 6 mo f/u visit.

## 2018-07-12 NOTE — Assessment & Plan Note (Addendum)
Chronic, stable - but ran out of victoza and then states it was never refilled - I reviewed our chart and it was never requested. rec restart victoza daily.  She will check on insurance coverage for diabetes classes

## 2018-07-12 NOTE — Patient Instructions (Addendum)
Flu shot today Check with insurance about coverage for diabetes classes Diflucan sent for possible yeast infection. Restart victoza I think this will help.

## 2018-07-12 NOTE — Assessment & Plan Note (Signed)
Mildly elevated today - no changes made.  

## 2018-07-12 NOTE — Assessment & Plan Note (Signed)
Chronic, stable. Continue current regimen. 

## 2018-07-20 DIAGNOSIS — F3181 Bipolar II disorder: Secondary | ICD-10-CM | POA: Diagnosis not present

## 2018-07-26 ENCOUNTER — Telehealth: Payer: Self-pay | Admitting: Nurse Practitioner

## 2018-07-27 ENCOUNTER — Telehealth: Payer: Self-pay | Admitting: *Deleted

## 2018-07-27 DIAGNOSIS — Z122 Encounter for screening for malignant neoplasm of respiratory organs: Secondary | ICD-10-CM

## 2018-07-27 DIAGNOSIS — Z87891 Personal history of nicotine dependence: Secondary | ICD-10-CM

## 2018-07-27 NOTE — Telephone Encounter (Signed)
Patient has been notified that annual lung cancer screening low dose CT scan is due currently or will be in near future. Confirmed that patient is within the age range of 55-77, and asymptomatic, (no signs or symptoms of lung cancer). Patient denies illness that would prevent curative treatment for lung cancer if found. Verified smoking history, (former, quit 2010, 45 pack year). The shared decision making visit was done 07/15/16. Patient is agreeable for CT scan being scheduled.

## 2018-07-29 DIAGNOSIS — G8929 Other chronic pain: Secondary | ICD-10-CM | POA: Diagnosis not present

## 2018-07-29 DIAGNOSIS — M170 Bilateral primary osteoarthritis of knee: Secondary | ICD-10-CM | POA: Diagnosis not present

## 2018-08-03 ENCOUNTER — Ambulatory Visit
Admission: RE | Admit: 2018-08-03 | Discharge: 2018-08-03 | Disposition: A | Discharge status: Home / Self Care | Payer: Medicare Other | Source: Ambulatory Visit | Attending: Nurse Practitioner | Admitting: Nurse Practitioner

## 2018-08-03 ENCOUNTER — Encounter: Payer: Self-pay | Admitting: *Deleted

## 2018-08-03 DIAGNOSIS — Z87891 Personal history of nicotine dependence: Secondary | ICD-10-CM

## 2018-08-03 DIAGNOSIS — I700 Atherosclerosis of aorta: Secondary | ICD-10-CM | POA: Diagnosis not present

## 2018-08-03 DIAGNOSIS — Z122 Encounter for screening for malignant neoplasm of respiratory organs: Secondary | ICD-10-CM

## 2018-08-03 DIAGNOSIS — I2510 Atherosclerotic heart disease of native coronary artery without angina pectoris: Secondary | ICD-10-CM | POA: Insufficient documentation

## 2018-08-03 DIAGNOSIS — J439 Emphysema, unspecified: Secondary | ICD-10-CM | POA: Diagnosis not present

## 2018-08-31 DIAGNOSIS — M545 Low back pain: Secondary | ICD-10-CM | POA: Diagnosis not present

## 2018-08-31 DIAGNOSIS — I10 Essential (primary) hypertension: Secondary | ICD-10-CM | POA: Diagnosis not present

## 2018-08-31 DIAGNOSIS — M4316 Spondylolisthesis, lumbar region: Secondary | ICD-10-CM | POA: Diagnosis not present

## 2018-08-31 DIAGNOSIS — Z6841 Body Mass Index (BMI) 40.0 and over, adult: Secondary | ICD-10-CM | POA: Diagnosis not present

## 2018-09-01 ENCOUNTER — Other Ambulatory Visit: Payer: Self-pay | Admitting: Neurosurgery

## 2018-09-01 ENCOUNTER — Other Ambulatory Visit (HOSPITAL_COMMUNITY): Payer: Self-pay | Admitting: Neurosurgery

## 2018-09-01 DIAGNOSIS — M4316 Spondylolisthesis, lumbar region: Secondary | ICD-10-CM

## 2018-09-03 ENCOUNTER — Encounter: Payer: Self-pay | Admitting: Family Medicine

## 2018-09-08 DIAGNOSIS — F3181 Bipolar II disorder: Secondary | ICD-10-CM | POA: Diagnosis not present

## 2018-09-13 DIAGNOSIS — J370 Chronic laryngitis: Secondary | ICD-10-CM | POA: Diagnosis not present

## 2018-09-13 DIAGNOSIS — J322 Chronic ethmoidal sinusitis: Secondary | ICD-10-CM | POA: Diagnosis not present

## 2018-09-13 DIAGNOSIS — R05 Cough: Secondary | ICD-10-CM | POA: Diagnosis not present

## 2018-09-13 DIAGNOSIS — J320 Chronic maxillary sinusitis: Secondary | ICD-10-CM | POA: Diagnosis not present

## 2018-09-14 ENCOUNTER — Ambulatory Visit: Payer: Medicare Other | Attending: Nurse Practitioner | Admitting: Nurse Practitioner

## 2018-09-14 ENCOUNTER — Other Ambulatory Visit: Payer: Self-pay

## 2018-09-14 ENCOUNTER — Encounter: Payer: Self-pay | Admitting: Nurse Practitioner

## 2018-09-14 VITALS — BP 156/79 | HR 82 | Temp 98.0°F | Resp 16 | Ht 64.0 in | Wt 327.7 lb

## 2018-09-14 DIAGNOSIS — M5442 Lumbago with sciatica, left side: Secondary | ICD-10-CM

## 2018-09-14 DIAGNOSIS — F319 Bipolar disorder, unspecified: Secondary | ICD-10-CM | POA: Insufficient documentation

## 2018-09-14 DIAGNOSIS — M899 Disorder of bone, unspecified: Secondary | ICD-10-CM | POA: Diagnosis not present

## 2018-09-14 DIAGNOSIS — M25561 Pain in right knee: Secondary | ICD-10-CM | POA: Diagnosis not present

## 2018-09-14 DIAGNOSIS — E1142 Type 2 diabetes mellitus with diabetic polyneuropathy: Secondary | ICD-10-CM | POA: Diagnosis not present

## 2018-09-14 DIAGNOSIS — K219 Gastro-esophageal reflux disease without esophagitis: Secondary | ICD-10-CM | POA: Insufficient documentation

## 2018-09-14 DIAGNOSIS — J432 Centrilobular emphysema: Secondary | ICD-10-CM | POA: Diagnosis not present

## 2018-09-14 DIAGNOSIS — Z789 Other specified health status: Secondary | ICD-10-CM | POA: Diagnosis not present

## 2018-09-14 DIAGNOSIS — E039 Hypothyroidism, unspecified: Secondary | ICD-10-CM | POA: Diagnosis not present

## 2018-09-14 DIAGNOSIS — I2510 Atherosclerotic heart disease of native coronary artery without angina pectoris: Secondary | ICD-10-CM | POA: Diagnosis not present

## 2018-09-14 DIAGNOSIS — M5412 Radiculopathy, cervical region: Secondary | ICD-10-CM | POA: Insufficient documentation

## 2018-09-14 DIAGNOSIS — I272 Pulmonary hypertension, unspecified: Secondary | ICD-10-CM | POA: Diagnosis not present

## 2018-09-14 DIAGNOSIS — M25552 Pain in left hip: Secondary | ICD-10-CM | POA: Diagnosis not present

## 2018-09-14 DIAGNOSIS — Z981 Arthrodesis status: Secondary | ICD-10-CM | POA: Insufficient documentation

## 2018-09-14 DIAGNOSIS — G894 Chronic pain syndrome: Secondary | ICD-10-CM

## 2018-09-14 DIAGNOSIS — Z79890 Hormone replacement therapy (postmenopausal): Secondary | ICD-10-CM | POA: Diagnosis not present

## 2018-09-14 DIAGNOSIS — M25562 Pain in left knee: Secondary | ICD-10-CM

## 2018-09-14 DIAGNOSIS — E7800 Pure hypercholesterolemia, unspecified: Secondary | ICD-10-CM | POA: Diagnosis not present

## 2018-09-14 DIAGNOSIS — Z79899 Other long term (current) drug therapy: Secondary | ICD-10-CM | POA: Diagnosis not present

## 2018-09-14 DIAGNOSIS — M25551 Pain in right hip: Secondary | ICD-10-CM | POA: Diagnosis not present

## 2018-09-14 DIAGNOSIS — Z7982 Long term (current) use of aspirin: Secondary | ICD-10-CM | POA: Insufficient documentation

## 2018-09-14 DIAGNOSIS — M79605 Pain in left leg: Secondary | ICD-10-CM | POA: Diagnosis not present

## 2018-09-14 DIAGNOSIS — Z87891 Personal history of nicotine dependence: Secondary | ICD-10-CM | POA: Diagnosis not present

## 2018-09-14 DIAGNOSIS — M1610 Unilateral primary osteoarthritis, unspecified hip: Secondary | ICD-10-CM | POA: Insufficient documentation

## 2018-09-14 DIAGNOSIS — G8929 Other chronic pain: Secondary | ICD-10-CM | POA: Insufficient documentation

## 2018-09-14 DIAGNOSIS — I10 Essential (primary) hypertension: Secondary | ICD-10-CM | POA: Diagnosis not present

## 2018-09-14 DIAGNOSIS — M545 Low back pain: Secondary | ICD-10-CM | POA: Diagnosis present

## 2018-09-14 NOTE — Progress Notes (Signed)
Patient's Name: Amy Barnes  MRN: [id number]  Referring Provider: Newman Pies, MD  DOB: July 16, 1951  PCP: Ria Bush, MD  DOS: 09/14/2018  Note by: Dionisio David NP  Service setting: Ambulatory outpatient  Specialty: Interventional Pain Management  Location: [facility] (AMB) Pain Management Facility    Patient type: New Patient    Primary Reason(s) for Visit: Initial Patient Evaluation CC: Back Pain (lower) and Knee Pain (bilateral)  HPI  [name] is a 67 y.o. year old, female patient, who comes today for an initial evaluation. She has Hypothyroidism; DM type 2 with diabetic peripheral neuropathy ([place]); Hypercholesterolemia; ANEMIA-NOS; Bipolar disorder ([place]); Benign positional vertigo; Essential hypertension; Allergic rhinitis; Asthma; GERD; Osteoarthritis; HIP PAIN; OSA on CPAP; CERVICAL RADICULOPATHY, RIGHT; BMI 50.0-59.9, adult ([place]); Urinary urgency; MRSA (methicillin resistant Staphylococcus aureus) infection; Abdominal aortic atherosclerosis ([place]); Pedal edema; Chronic pain syndrome; Degenerative joint disease (DJD) of hip; Complex regional pain syndrome of both lower extremities; Somatic dysfunction of sacroiliac joint; S/P lumbar fusion; Cervical fusion syndrome; DOE (dyspnea on exertion); Chronic radicular lumbar pain; Welcome to Medicare preventive visit; Ex-smoker; Nonspecific abnormal electrocardiogram (ECG) (EKG); CAD (coronary artery disease); Centrilobular emphysema ([place]); Thoracic aortic atherosclerosis ([place]); Synovial cyst of lumbar facet joint; Advanced care planning/counseling discussion; PAH (pulmonary artery hypertension) ([place]); 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 Her primarily concern today is the Back Pain (lower) and Knee Pain (bilateral)  Pain Assessment: Location: Lower, Right, Left Back Radiating: hips/buttocks Onset: More than a month ago Duration: Chronic pain Quality: Aching Severity: 5 /10 (subjective, self-reported pain score)  Note: Reported level is compatible with observation. Clinically the patient looks like a 2/10 A 2/10 is viewed as "Mild to Moderate" and described as noticeable and distracting. Impossible to hide from other people. More frequent flare-ups. Still possible to adapt and function close to normal. It can be very annoying and may have occasional stronger flare-ups. With discipline, patients may get used to it and adapt. Information on the proper use of the pain scale provided to the patient today.  Effect on ADL: prolonged waling, prolonged standing Timing: Constant Modifying factors: heat, TENs unit, ice BP: (!) 156/79  HR: 82  Onset and Duration: Gradual and Date of onset: 10 years ago. Cause of pain: Unknown Severity: Getting worse, NAS-11 at its worse: 9/10, NAS-11 at its best: 4/10, NAS-11 now: 5/10 and NAS-11 on the average: 7/10 Timing: Not influenced by the time of the day Aggravating Factors: Bending, Climbing, Kneeling, Lifiting, Motion, Prolonged sitting, Prolonged standing, Squatting, Stooping , Walking, Walking uphill, Walking downhill and Working Alleviating Factors: Cold packs, Hot packs, Lying down and TENS Associated Problems: Fatigue, Impotence, Inability to concentrate, Inability to control bladder (urine), Inability to control bowel, Nausea, Numbness, Spasms, Sweating and Swelling Quality of Pain: Aching, Agonizing, Annoying, Constant, Deep, Sharp, Shooting, Sickening,  Stabbing, Tender and Tingling Previous Examinations or Tests: MRI scan, X-rays and Orthopedic evaluation Previous Treatments: Narcotic medications and TENS  The patient comes into the clinics today for the first time for a chronic pain management  evaluation.  According to the patient her primary area of pain is in her lower back.  She admits that this started this summer.  She denies any injury or fall.  She is status post lumbar spinal surgery L3-4 2013 by Dr. [name].  She admits the surgery was effective.  She denies any previous interventional therapy before or after surgery.  Her last physical therapy was in 2013.  She did have a recent x-ray.  MRI is scheduled for tomorrow.  Her second area of pain is in her left leg.  She admits the pain goes down into the front of her thigh.  She does have some numbness.  She denies nerve conduction study.  She feels like she did do some physical therapy in 2015.  Her third area pain is in her hips she admits the left is greater than the right.  She is status post left total hip replacement 2012 with a revision in 2014.  She denies any previous interventional therapy.  Physical therapy last completed early part of 2015 she is unsure of recent images.  Her fourth area of pain is in her knees.  She admits the right is greater than the left.  She admits that she has been told that the left knee looks worse than the right.  She admits that she has to lose 100 pounds in order to have any surgeries for her knees.  She has had steroid injections in the past along with gel injections last being in 2014 completed in [place].  She has not had any recent physical therapy x-rays unsure.  Today I took the time to provide the patient with information regarding this pain practice. The patient was informed that the practice is divided into two sections: an interventional pain management section, as well as a completely separate and distinct medication management section. I  explained that there are procedure days for interventional therapies, and evaluation days for follow-ups and medication management. Because of the amount of documentation required during both, they are kept separated. This means that there is the possibility that she may be scheduled for a procedure on one day, and medication management the next. I have also informed her that because of staffing and facility limitations, this practice will no longer take patients for medication management only. To illustrate the reasons for this, I gave the patient the example of surgeons, and how inappropriate it would be to refer a patient to his/her care, just to write for the post-surgical antibiotics on a surgery done by a different surgeon.   Because interventional pain management is part of the board-certified specialty for the doctors, the patient was informed that joining this practice means that they are open to any and all interventional therapies. I made it clear that this does not mean that they will be forced to have any procedures done. What this means is that I believe interventional therapies to be essential part of the diagnosis and proper management of chronic pain conditions. Therefore, patients not interested in these interventional alternatives will be better served under the care of a different practitioner.  The patient was also made aware of my Comprehensive Pain Management Safety Guidelines where by joining this practice, they limit all of their nerve blocks and joint injections to those done by our practice, for as long as we are retained to manage their care. Historic Controlled Substance Pharmacotherapy Review  PMP and historical list of controlled substances: Oxycodone/acetaminophen 5/325 mg, hydromorphone 4 mg, hydromorphone 2 mg,  Highest opioid analgesic regimen found: Hydromorphone 4 mg 1 tablet every 4 hours (fill date 11/14/2016) hydromorphone 24 mg/day Most recent opioid analgesic:  None Current opioid analgesics: None Highest recorded MME/day: 106.67 mg/day MME/day: 0 mg/day Medications: The patient did not bring the medication(s) to the appointment, as requested in our "New Patient Package" Pharmacodynamics: Desired effects: Analgesia: The patient reports >50% benefit. Reported improvement in function: The patient reports medication allows her to accomplish basic ADLs. Clinically meaningful improvement in function (CMIF): Sustained CMIF goals met Perceived effectiveness: Described as relatively effective, allowing for increase in activities of daily living (ADL) Undesirable effects: Side-effects or Adverse reactions: None reported Historical Monitoring: The patient  reports no history of drug use. List of all UDS Test(s): No results found for: MDMA, COCAINSCRNUR, PCPSCRNUR, PCPQUANT, CANNABQUANT, THCU, [place] List of all Serum Drug Screening Test(s):  No results found for: AMPHSCRSER, BARBSCRSER, BENZOSCRSER, COCAINSCRSER, PCPSCRSER, PCPQUANT, THCSCRSER, CANNABQUANT, OPIATESCRSER, OXYSCRSER, PROPOXSCRSER Historical Background Evaluation: [place] PDMP: Six (6) year initial data search conducted.             [place] Department of public safety, offender search: Editor, commissioning Information) Non-contributory Risk Assessment Profile: Aberrant behavior: None observed or detected today Risk factors for fatal opioid overdose: None identified today Fatal overdose hazard ratio (HR): Calculation deferred Non-fatal overdose hazard ratio (HR): Calculation deferred Risk of opioid abuse or dependence: 0.7-3.0% with doses ? 36 MME/day and 6.1-26% with doses ? 120 MME/day. Substance use disorder (SUD) risk level: Pending results of Medical Psychology Evaluation for SUD Opioid risk tool (ORT) (Total Score): 0  ORT Scoring interpretation table:  Score <3 = Low Risk for SUD  Score between 4-7 = Moderate Risk for SUD  Score >8 = High Risk for Opioid Abuse   PHQ-2 Depression Scale:  Total score:     PHQ-2 Scoring interpretation table: (Score and probability of major depressive disorder)  Score 0 = No depression  Score 1 = 15.4% Probability  Score 2 = 21.1% Probability  Score 3 = 38.4% Probability  Score 4 = 45.5% Probability  Score 5 = 56.4% Probability  Score 6 = 78.6% Probability   PHQ-9 Depression Scale:  Total score:    PHQ-9 Scoring interpretation table:  Score 0-4 = No depression  Score 5-9 = Mild depression  Score 10-14 = Moderate depression  Score 15-19 = Moderately severe depression  Score 20-27 = Severe depression (2.4 times higher risk of SUD and 2.89 times higher risk of overuse)   Pharmacologic Plan: Pending ordered tests and/or consults  Meds  The patient has a current medication list which includes the following prescription(s): albuterol, aspirin, celecoxib, cyclobenzaprine, doxycycline, fluticasone-salmeterol, furosemide, lamotrigine, levothyroxine, liraglutide, losartan, montelukast, multivitamin with minerals, ondansetron, oxcarbazepine, oxycodone-acetaminophen, pantoprazole, promethazine, simvastatin, and zaleplon.  Current Outpatient Medications on File Prior to Visit  Medication Sig  . albuterol (PROVENTIL HFA;VENTOLIN HFA) 108 (90 BASE) MCG/ACT inhaler Inhale 2 puffs into the lungs every 6 (six) hours as needed for wheezing or shortness of breath. Reported on 04/17/2016  . aspirin 81 MG EC tablet Take 81 mg by mouth at bedtime.   . celecoxib (CELEBREX) 200 MG capsule Take 1 capsule (200 mg total) by mouth daily.  . cyclobenzaprine (FLEXERIL) 10 MG tablet Take 1 tablet (10 mg total) by mouth 3 (three) times daily as needed for muscle spasms.  Marland Kitchen doxycycline (VIBRA-TABS) 100 MG tablet Take 1 tablet (100 mg total) by mouth at bedtime. Reported on 12/31/2015  . Fluticasone-Salmeterol (ADVAIR) 250-50 MCG/DOSE AEPB Inhale 1 puff into  the lungs 2 (two) times daily.  . furosemide (LASIX) 40 MG tablet Take 1 tablet (40 mg total) by mouth daily as needed.  .  lamoTRIgine (LAMICTAL) 200 MG tablet Take 200 mg by mouth daily.    Marland Kitchen levothyroxine (SYNTHROID, LEVOTHROID) 150 MCG tablet Take 1 tablet (150 mcg total) by mouth at bedtime.  . liraglutide (VICTOZA) 18 MG/3ML SOPN Inject 0.3 mLs (1.8 mg total) into the skin at bedtime.  Marland Kitchen losartan (COZAAR) 50 MG tablet Take 1 tablet (50 mg total) by mouth daily.  . montelukast (SINGULAIR) 10 MG tablet TAKE 1 TABLET BY MOUTH EVERYDAY AT BEDTIME  . Multiple Vitamin (MULTIVITAMIN WITH MINERALS) TABS tablet Take 2 tablets by mouth daily.   . ondansetron (ZOFRAN) 8 MG tablet Take 8 mg by mouth every 8 (eight) hours as needed for nausea or vomiting.   . Oxcarbazepine (TRILEPTAL) 300 MG tablet Take 300-600 mg by mouth 2 (two) times daily. Pt takes one tablet in the morning and two in the evening.  Marland Kitchen oxyCODONE-acetaminophen (PERCOCET) 5-325 MG tablet 1-2 tabs po q6 hours prn pain  . pantoprazole (PROTONIX) 40 MG tablet Take 1 tablet (40 mg total) by mouth daily.  . promethazine (PHENERGAN) 25 MG tablet Take 25 mg by mouth every 6 (six) hours as needed for nausea or vomiting.  . simvastatin (ZOCOR) 40 MG tablet TAKE 1 TABLET BY MOUTH EVERY DAY IN THE EVENING  . zaleplon (SONATA) 10 MG capsule Take 10 mg by mouth at bedtime as needed for sleep. Reported on 04/17/2016   No current facility-administered medications on file prior to visit.    Imaging Review  Shoulder Imaging:  Results for orders placed during the hospital encounter of 10/14/12  MR Shoulder Left W Wo Contrast   Narrative *RADIOLOGY REPORT*  Clinical Data:  Left shoulder pain.  History of previous leg abscess.  MRI LEFT SHOULDER WITH AND WITHOUT CONTRAST  Technique:  Multiplanar, multisequence MR imaging of the left shoulder was performed before and after the administration of intravenous contrast.  Contrast: 24m MULTIHANCE GADOBENATE DIMEGLUMINE 529 MG/ML IV SOLN  Comparison:  None  Findings: Mild to moderate rotator cuff  tendinopathy/tendinosis. There is a shallow articular surface tear involving the anterior aspect of the supraspinatus tendon but no discrete full-thickness rotator cuff tear.  The subscapularis tendon is intact. Interstitial tears are noted.  The long head biceps tendon is intact.  The glenoid labra are grossly normal.  Mild AC joint degenerative changes with a small amount of fluid in the joint but no findings to suggest septic arthritis or osteomyelitis.  Mild glenohumeral joint degenerative changes but no joint effusion or findings for septic arthritis.  Minimal subacromial/subdeltoid bursitis.  IMPRESSION:  1.  Mild to moderate rotator cuff tendinopathy/tendinosis.  Shallow articular surface tear involving the anterior aspect of the supraspinatus tendon. 2.  The AC joint and glenohumeral joint degenerative changes but no findings to suggest septic arthritis or osteomyelitis. 3.  Intact biceps tendon and glenoid labra. 4.  Mild subacromial/subdeltoid bursitis.   Original Report Authenticated By: PMarijo Sanes M.D.   Lumbosacral Imaging:  Results for orders placed during the hospital encounter of 10/17/16  MR Lumbar Spine W Wo Contrast   Narrative CLINICAL DATA:  Chronic low back pain. Left leg pain above the knee.  EXAM: MRI LUMBAR SPINE WITHOUT AND WITH CONTRAST  TECHNIQUE: Multiplanar and multiecho pulse sequences of the lumbar spine were obtained without and with intravenous contrast.  CONTRAST:  271mMULTIHANCE GADOBENATE DIMEGLUMINE 529 MG/ML  IV SOLN  COMPARISON:  08/30/2012.  FINDINGS: Segmentation:  Standard.  Alignment:  Physiologic.  Vertebrae:  No fracture, evidence of discitis, or bone lesion.  Conus medullaris: Extends to the L1 level and appears normal. No abnormal intrathecal enhancement. No evidence of arachnoiditis.  Paraspinal and other soft tissues: Negative  Disc levels:  T11-12: Disc narrowing and bulging effacing the ventral thecal  sac. No cord compression.  T12- L1: Unremarkable.  L1-L2: Unremarkable.  L2-L3: Relative disc desiccation with mild right eccentric bulging. Facet arthropathy with mild ligament thickening. No noted impingement  L3-L4: Discectomy with posterior rod and pedicle screw fixation. On recent radiography there is lucency around both the L3 and L4 pedicle screws suggesting loosening; this is despite the appearance of solid bony intervertebral fusion on 11/27/2015 abdominal CT. No impingement  L4-L5: Mild disc narrowing and bulging. Facet arthropathy with spurring and ligament thickening. There is a 7 mm left-sided synovial cyst impinging on the descending L5 nerve. Disc bulging narrows the foramina without L4 compression.  L5-S1:Mild facet spurring.  No impingement  IMPRESSION: 1. L4-5 facet arthropathy with 7 mm left synovial cyst impinging on the L5 nerve root in the subarticular recess. 2. L3-4 discectomy and posterior fusion. The pedicle screws have prominent lucency on recent radiography. No impingement. 3. L2-3 mild facet arthropathy without impingement.   Electronically Signed   By: Monte Fantasia M.D.   On: 10/17/2016 15:14    Lumbar DG 1V:  Results for orders placed during the hospital encounter of 11/13/16  DG Lumbar Spine 1 View   Narrative CLINICAL DATA:  Elective surgery  EXAM: LUMBAR SPINE - 1 VIEW  COMPARISON:  MRI 10/17/2016  FINDINGS: Cross-table lateral view of the cervical spine demonstrates posterior surgical instruments overlying the hardware at the L3-4 level.  IMPRESSION: Intraoperative localization as above.   Electronically Signed   By: Rolm Baptise M.D.   On: 11/13/2016 11:35    Results for orders placed during the hospital encounter of 01/08/12  DG Lumbar Spine 2-3 Views   Narrative *RADIOLOGY REPORT*  Clinical Data: L3-L4 PLIF.  LUMBAR SPINE - 2-3 VIEW  Comparison: None.  Findings: Three intraoperative fluoroscopic spot films  demonstrate posterior rod and pedicle screw fixation at L3-L4.  Soft tissue retractors are present posterior to L3-L4.  IMPRESSION: L3-L4 PLIF.  Original Report Authenticated By: Dereck Ligas, M.D.  Hip Imaging:  Hip-L DG 2-3 views:  Results for orders placed during the hospital encounter of 10/20/16  DG HIP UNILAT WITH PELVIS 2-3 VIEWS LEFT   Narrative CLINICAL DATA:  Left hip pain for several weeks  EXAM: DG HIP (WITH OR WITHOUT PELVIS) 2-3V LEFT  COMPARISON:  None.  FINDINGS: Changes of left hip replacement. No hardware or bony complicating feature. No acute bony abnormality. Specifically, no fracture, subluxation, or dislocation. Soft tissues are intact.  IMPRESSION: Left hip replacement.  No acute bony abnormality.   Electronically Signed   By: Rolm Baptise M.D.   On: 10/20/2016 15:04    Knee Imaging:  Knee-R MR wo contrast:  Results for orders placed in visit on 04/22/16  MR Knee Right Wo Contrast   Note: Available results from prior imaging studies were reviewed.        ROS  Cardiovascular History: Daily Aspirin intake, High blood pressure and Needs antibiotics prior to dental procedures Pulmonary or Respiratory History: Wheezing and difficulty taking a deep full breath (Asthma), Shortness of breath, Smoking, Snoring  and Coughing up mucus (Bronchitis) Neurological History: No reported neurological  signs or symptoms such as seizures, abnormal skin sensations, urinary and/or fecal incontinence, being born with an abnormal open spine and/or a tethered spinal cord Review of Past Neurological Studies:  Results for orders placed or performed during the hospital encounter of 05/26/11  CT Head Wo Contrast   Narrative   *RADIOLOGY REPORT*  Clinical Data: New headache.  Photophobia.  CT HEAD WITHOUT CONTRAST  Technique:  Contiguous axial images were obtained from the base of the skull through the vertex without contrast.  Comparison: None.  Findings: The  ventricles are normal in size.  Negative for hemorrhage, hydrocephalus, mass effect, mass lesion, or evidence of acute cortically based infarction.  The skull is intact.  The visualized paranasal sinuses and mastoid air cells and middle ears are clear.  Soft tissues of the scalp and orbits are symmetric.  IMPRESSION: Negative head CT.  Original Report Authenticated By: Curlene Dolphin, M.D.   Psychological-Psychiatric History: Psychiatric disorder Gastrointestinal History: Reflux or heatburn, Alternating episodes iof diarrhea and constipation (IBS-Irritable bowe syndrome) and Irregular, infrequent bowel movements (Constipation) Genitourinary History: No reported renal or genitourinary signs or symptoms such as difficulty voiding or producing urine, peeing blood, non-functioning kidney, kidney stones, difficulty emptying the bladder, difficulty controlling the flow of urine, or chronic kidney disease Hematological History: No reported hematological signs or symptoms such as prolonged bleeding, low or poor functioning platelets, bruising or bleeding easily, hereditary bleeding problems, low energy levels due to low hemoglobin or being anemic Endocrine History: High blood sugar requiring insulin (IDDM) Rheumatologic History: No reported rheumatological signs and symptoms such as fatigue, joint pain, tenderness, swelling, redness, heat, stiffness, decreased range of motion, with or without associated rash Musculoskeletal History: Negative for myasthenia gravis, muscular dystrophy, multiple sclerosis or malignant hyperthermia Work History: Disabled  Allergies  Ms. Nee is allergic to cephalexin; hydrocodone; risperidone and related; seroquel [quetiapine fumarate]; and sulfa antibiotics.  Laboratory Chemistry  Inflammation Markers Lab Results  Component Value Date   CRP 5 09/14/2018   ESRSEDRATE 21 09/14/2018   (CRP: Acute Phase) (ESR: Chronic Phase) Renal Function Markers Lab Results   Component Value Date   BUN 16 09/14/2018   CREATININE 0.58 09/14/2018   GFRAA 111 09/14/2018   GFRNONAA 96 09/14/2018   Hepatic Function Markers Lab Results  Component Value Date   AST 18 09/14/2018   ALT 19 07/09/2018   ALBUMIN 4.7 09/14/2018   ALKPHOS 74 09/14/2018   HCVAB NEGATIVE 07/01/2016   Electrolytes Lab Results  Component Value Date   NA 131 (L) 09/14/2018   K 4.6 09/14/2018   CL 92 (L) 09/14/2018   CALCIUM 10.0 09/14/2018   MG 1.9 09/14/2018   Neuropathy Markers Lab Results  Component Value Date   VITAMINB12 739 09/14/2018   Bone Pathology Markers Lab Results  Component Value Date   ALKPHOS 74 09/14/2018   25OHVITD1 WILL FOLLOW 09/14/2018   25OHVITD2 WILL FOLLOW 09/14/2018   25OHVITD3 WILL FOLLOW 09/14/2018   CALCIUM 10.0 09/14/2018   Coagulation Parameters Lab Results  Component Value Date   INR 1.18 08/31/2012   LABPROT 14.8 08/31/2012   PLT 238.0 07/09/2018   Cardiovascular Markers Lab Results  Component Value Date   HGB 12.5 07/09/2018   HCT 37.3 07/09/2018   Note: Lab results reviewed.  PFSH  Drug: Ms. Rish  reports no history of drug use. Alcohol:  reports current alcohol use. Tobacco:  reports that she quit smoking about 9 years ago. Her smoking use included cigarettes. She smoked 1.50 packs per  day. She has never used smokeless tobacco. Medical:  has a past medical history of Allergic rhinitis, Anemia, Anxiety, Asthma, Bipolar affective disorder ([place]), CAD (coronary artery disease) (07/2016), Carpal tunnel syndrome, Cataract, Centrilobular emphysema ([place]) (07/2016), Constipation due to pain medication, Depression with anxiety, Diabetes mellitus, GERD (gastroesophageal reflux disease), History of MRSA infection (2015), Hypertension, OSA (obstructive sleep apnea), Osteoarthritis, Osteoarthritis, Pneumonia, Restless legs, Septic arthritis ([place]) (10/11/2012), Shingles (06/30/2016), Shortness of breath, Status post revision of total hip  replacement (bilateral), and Thoracic aortic atherosclerosis ([place]) (11/207). Family: family history includes Alcohol abuse in her father; Aneurysm (age of onset: 39) in her father; CAD in an other family member; Cancer in her brother and father; Diabetes in her brother.  Past Surgical History:  Procedure Laterality Date  . CARPAL TUNNEL RELEASE Right 05/15/2011  . CARPAL TUNNEL RELEASE Left 12/24/2017   Procedure: LEFT CARPAL TUNNEL RELEASE;  Surgeon: Leanora Cover, MD;  Location: [place];  Service: Orthopedics;  Laterality: Left;  . CERVICAL FUSION  2012   C2/3/4  . COLONOSCOPY WITH PROPOFOL N/A 12/31/2015   diverticulosis, int hem, o/w normal rpt 10 yrs ([place])  . EYE SURGERY Bilateral    cataract surgery with lens implant  . FOOT SURGERY  1982   bone spur  . I&D EXTREMITY  09/06/2012   [name], MD; Right;  I&D of right thigh  . I&D EXTREMITY Left 07/2013   wound vac - daily doxycycline indefinitely  . KNEE ARTHROSCOPY  09/01/2012   [name], MD;  Right  . LUMBAR FUSION  01/08/2012   L3-4  . LUMBAR LAMINECTOMY/DECOMPRESSION MICRODISCECTOMY N/A 11/13/2016   LAMINOTOMY/LAMINECTOMY LUMBAR FOUR LUMBAR FIVE  WITH RESECTION OF SYNOVIAL CYST;  Surgeon: Newman Pies, MD  . NECK SURGERY     Herniated disk C2,3,4  . NOSE SURGERY    . PARTIAL HYSTERECTOMY  1984   for mennorhagia, ovaries remain  . REVISION TOTAL HIP ARTHROPLASTY Left 08/28/2011  . TMJ ARTHROPLASTY  1982  . TOTAL HIP ARTHROPLASTY Left 2002  . TRIGGER FINGER RELEASE Right 05/15/2011   long finger   Active Ambulatory Problems    Diagnosis Date Noted  . Hypothyroidism 07/30/2007  . DM type 2 with diabetic peripheral neuropathy ([place]) 07/30/2007  . Hypercholesterolemia 08/16/2007  . ANEMIA-NOS 07/30/2007  . Bipolar disorder ([place]) 07/30/2007  . Benign positional vertigo 07/21/2008  . Essential hypertension 05/17/2010  . Allergic rhinitis 07/30/2007  . Asthma 07/30/2007  . GERD 07/30/2007  .  Osteoarthritis 07/30/2007  . HIP PAIN 12/25/2009  . OSA on CPAP 07/30/2007  . CERVICAL RADICULOPATHY, RIGHT 12/25/2009  . BMI 50.0-59.9, adult ([place]) 05/13/2011  . Urinary urgency 07/28/2012  . MRSA (methicillin resistant Staphylococcus aureus) infection 10/11/2012  . Abdominal aortic atherosclerosis ([place]) 12/11/2015  . Pedal edema 02/19/2016  . Chronic pain syndrome 02/19/2016  . Degenerative joint disease (DJD) of hip 04/17/2016  . Complex regional pain syndrome of both lower extremities 04/17/2016  . Somatic dysfunction of sacroiliac joint 04/17/2016  . S/P lumbar fusion 04/17/2016  . Cervical fusion syndrome 04/17/2016  . DOE (dyspnea on exertion) 05/22/2016  . Chronic radicular lumbar pain 06/09/2016  . Welcome to Medicare preventive visit 07/08/2016  . Ex-smoker 07/08/2016  . Nonspecific abnormal electrocardiogram (ECG) (EKG) 07/08/2016  . CAD (coronary artery disease) 07/30/2016  . Centrilobular emphysema ([place]) 07/30/2016  . Thoracic aortic atherosclerosis ([place])   . Synovial cyst of lumbar facet joint 11/13/2016  . Advanced care planning/counseling discussion 06/25/2017  . PAH (pulmonary artery hypertension) (  [place]) 08/03/2017  . 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 01/16/2018  . Vaginal itching 07/12/2018  . Hip pain 12/25/2009  . Infection or inflammatory reaction due to internal joint prosthesis ([place]) 12/03/2011  . Primary osteoarthritis of first carpometacarpal joint of left hand 04/20/2018  . Chronic bilateral low back pain with left-sided sciatica (Primary Area of Pain)  09/14/2018  . Chronic pain of left lower extremity (Secondary Area of Pain) 09/14/2018  . Chronic pain of both hips back [facility] Area of Pain) (L>R) 09/14/2018  . Chronic pain of both knees (Fourth Area of Pain) (R>L) 09/14/2018  . Pharmacologic therapy 09/14/2018  . Disorder of skeletal system 09/14/2018  . Problems influencing health status 09/14/2018   Resolved Ambulatory Problems    Diagnosis  Date Noted  . ASTHMA, WITH ACUTE EXACERBATION 11/09/2008  . TRIGGER FINGER, RIGHT MIDDLE 10/22/2010  . Acute pharyngitis 11/27/2010  . LEG PAIN 12/10/2010  . INTERTRIGO, CANDIDAL 12/16/2010  . Bronchitis 01/31/2011  . Abdominal pain, right lower quadrant 03/25/2011  . Headache(784.0) 05/26/2011  . Acute sinusitis 06/19/2011  . Preop examination 08/15/2011  . Leg pain 10/10/2011  . Right anterior knee pain 08/29/2012  . Left shoulder pain 10/11/2012  . Septic arthritis ([place]) 10/11/2012  . Mural thickening of sigmoid colon 11/22/2015  . Intractable nausea and vomiting 11/27/2015  . Foot injury 02/19/2016  . Shingles 06/30/2016  . Personal history of tobacco use, presenting hazards to health 07/18/2016  . Hand paresthesia 10/06/2017   Past Medical History:  Diagnosis Date  . Allergic rhinitis   . Anemia   . Anxiety   . Asthma   . Bipolar affective disorder ([place])   . Carpal tunnel syndrome   . Cataract   . Constipation due to pain medication   . Depression with anxiety   . Diabetes mellitus   . GERD (gastroesophageal reflux disease)   . History of MRSA infection 2015  . Hypertension   . OSA (obstructive sleep apnea)   . Pneumonia   . Restless legs   . Shortness of breath   . Status post revision of total hip replacement bilateral   Constitutional Exam  General appearance: alert, cooperative, in mild distress and morbidly obese Vitals:   09/14/18 1015  BP: (!) 156/79  Pulse: 82  Resp: 16  Temp: 98 F (36.7 C)  SpO2: 96%  Weight: (!) 327 lb 11.2 oz (148.6 kg)  Height: _0  (1.626 m)   BMI Assessment: Estimated body mass index is 56.25 kg/m as calculated from the following:   Height as of this encounter: _1  (1.626 m).   Weight as of this encounter: 327 lb 11.2 oz (148.6 kg).  BMI interpretation table: BMI level Category Range association with higher incidence of chronic pain  <18 kg/m2 Underweight   18.5-24.9 kg/m2 Ideal body weight   25-29.9 kg/m2  Overweight Increased incidence by 20%  30-34.9 kg/m2 Obese (Class I) Increased incidence by 68%  35-39.9 kg/m2 Severe obesity (Class II) Increased incidence by 136%  >40 kg/m2 Extreme obesity (Class III) Increased incidence by 254%   BMI Readings from Last 4 Encounters:  09/14/18 56.25 kg/m  08/03/18 53.90 kg/m  07/12/18 54.75 kg/m  01/12/18 51.70 kg/m   Wt Readings from Last 4 Encounters:  09/14/18 (!) 327 lb 11.2 oz (148.6 kg)  08/03/18 (!) 314 lb (142.4 kg)  07/12/18 (!) 314 lb (142.4 kg)  01/12/18 296 lb 8 oz (134.5 kg)  Psych/Mental status: Alert, oriented x 3 (person, place, & time)  Eyes: PERLA Respiratory: No evidence of acute respiratory distress  Cervical Spine Exam  Inspection: No masses, redness, or swelling Alignment: Symmetrical Functional ROM: Unrestricted ROM      Stability: No instability detected Muscle strength & Tone: Functionally intact Sensory: Unimpaired Palpation: No palpable anomalies              Upper Extremity (UE) Exam    Side: Right upper extremity  Side: Left upper extremity  Inspection: No masses, redness, swelling, or asymmetry. No contractures  Inspection: No masses, redness, swelling, or asymmetry. No contractures  Functional ROM: Unrestricted ROM          Functional ROM: Unrestricted ROM          Muscle strength & Tone: Functionally intact  Muscle strength & Tone: Functionally intact  Sensory: Unimpaired  Sensory: Unimpaired  Palpation: No palpable anomalies              Palpation: No palpable anomalies              Specialized Test(s): Deferred         Specialized Test(s): Deferred          Thoracic Spine Exam  Inspection: No masses, redness, or swelling Alignment: Symmetrical Functional ROM: Unrestricted ROM Stability: No instability detected Sensory: Unimpaired Muscle strength & Tone: No palpable anomalies  Lumbar Spine Exam  Inspection: No masses, redness, or swelling Alignment: Symmetrical Functional ROM: Unrestricted  ROM      Stability: No instability detected Muscle strength & Tone: Functionally intact Sensory: Unimpaired Palpation: Complains of area being tender to palpation       Provocative Tests: Lumbar Hyperextension and rotation test: Unable to perform       Patrick's Maneuver: Unable to perform                    Gait & Posture Assessment  Ambulation: Patient ambulates using a cane Gait: Antalgic Posture: Antalgic   Lower Extremity Exam    Side: Right lower extremity  Side: Left lower extremity  Inspection: No masses, redness, swelling, or asymmetry. No contractures  Inspection: No masses, redness, swelling, or asymmetry. No contractures  Functional ROM: Decreased ROM for hip and knee joints  Functional ROM: Decreased ROM for hip and knee joints  Muscle strength & Tone: Functionally intact  Muscle strength & Tone: Functionally intact  Sensory: Unimpaired  Sensory: Unimpaired  Palpation: Complains of area being tender to palpation  Palpation: Complains of area being tender to palpation   Assessment  Primary Diagnosis & Pertinent Problem List: The primary encounter diagnosis was Chronic pain syndrome. Diagnoses of Chronic bilateral low back pain with left-sided sciatica (Primary Area of Pain) , Chronic pain of left lower extremity (Secondary Area of Pain), Chronic pain of both hips back [facility] Area of Pain) (L>R), Chronic pain of both knees (Fourth Area of Pain) (R>L), Pharmacologic therapy, Disorder of skeletal system, and Problems influencing health status were also pertinent to this visit.  Visit Diagnosis: 1. Chronic pain syndrome   2. Chronic bilateral low back pain with left-sided sciatica (Primary Area of Pain)    3. Chronic pain of left lower extremity (Secondary Area of Pain)   4. Chronic pain of both hips back [facility] Area of Pain) (L>R)   5. Chronic pain of both knees (Fourth Area of Pain) (R>L)   6. Pharmacologic therapy   7. Disorder of skeletal system   8. Problems  influencing health status    Plan of Care  Initial  treatment plan:  Please be advised that as per protocol, today's visit has been an evaluation only. We have not taken over the patient's controlled substance management.  Problem-specific plan: No problem-specific Assessment & Plan notes found for this encounter.  Ordered Lab-work, Procedure(s), Referral(s), & Consult(s): Orders Placed This Encounter  Procedures  . DG Knee 1-2 Views Right  . DG Knee 1-2 Views Left  . DG HIP UNILAT W OR W/O PELVIS 2-3 VIEWS RIGHT  . Compliance Drug Analysis, Ur  . Comp. Metabolic Panel (12)  . Magnesium  . Vitamin B12  . Sedimentation rate  . 25-Hydroxyvitamin D Lcms D2+D3  . C-reactive protein  . Ambulatory referral to Physical Therapy   Pharmacotherapy: Medications ordered:  No orders of the defined types were placed in this encounter.  Medications administered during this visit: Reita Chard. Thwaites had no medications administered during this visit.   Pharmacotherapy under consideration:  Opioid Analgesics: The patient was informed that there is no guarantee that she would be a candidate for opioid analgesics. The decision will be made following CDC guidelines. This decision will be based on the results of diagnostic studies, as well as Ms. Spada's risk profile.  Membrane stabilizer: To be determined at a later time Muscle relaxant: To be determined at a later time NSAID: To be determined at a later time Other analgesic(s): To be determined at a later time   Interventional therapies under consideration: Ms. Moller was informed that there is no guarantee that she would be a candidate for interventional therapies. The decision will be based on the results of diagnostic studies, as well as Ms. Gosling's risk profile.  Possible procedure(s): Diagnostic bilateral lumbar facet nerve block Possible bilateral lumbar facet radiofrequency ablation Diagnostic right-sided intra-articular hip  injection Diagnostic left femoral and obturator nerve block Possible left femoral and obturator radiofrequency ablation Diagnostic bilateral intra-articular knee injections Diagnostic bilateral Hyalgan series Diagnostic bilateral genicular nerve blocks   Provider-requested follow-up: Return for 2nd Visit, w/ Dr. Dossie Arbour.  Future Appointments  Date Time Provider [place]  09/15/2018  3:00 PM [facility]  10/12/2018  6:30 PM [place] Nordmann, PT [facility] None  10/13/2018 10:30 AM Milinda Pointer, MD [facility] None  10/19/2018 10:30 AM Shayra Nordmann, PT [facility] None  01/12/2019 10:30 AM Eustace Pen, LPN [facility]  6/43/3295  8:30 AM Ria Bush, MD [facility]    Primary Care Physician: Ria Bush, MD Location: [facility] Facility Note by:  Date: 09/14/2018; Time: 2:15 PM  Pain Score Disclaimer: We use the NRS-11 scale. This is a self-reported, subjective measurement of pain severity with only modest accuracy. It is used primarily to identify changes within a particular patient. It must be understood that outpatient pain scales are significantly less accurate that those used for research, where they can be applied under ideal controlled circumstances with minimal exposure to variables. In reality, the score is likely to be a combination of pain intensity and pain affect, where pain affect describes the degree of emotional arousal or changes in action readiness caused by the sensory experience of pain. Factors such as social and work situation, setting, emotional state, anxiety levels, expectation, and prior pain experience may influence pain perception and show large inter-individual differences that may also be affected by time variables.  Patient instructions provided during this appointment: Patient Instructions   ____________________________________________________________________________________________  Appointment  Policy Summary  It is our goal and responsibility to provide the medical community with assistance in  the evaluation and management of patients with chronic pain. Unfortunately our resources are limited. Because we do not have an unlimited amount of time, or available appointments, we are required to closely monitor and manage their use. The following rules exist to maximize their use:  Patient's responsibilities: 1. Punctuality:  At what time should I arrive? You should be physically present in our office 30 minutes before your scheduled appointment. Your scheduled appointment is with your assigned healthcare provider. However, it takes 5-10 minutes to be "checked-in", and another 15 minutes for the nurses to do the admission. If you arrive to our office at the time you were given for your appointment, you will end up being at least 20-25 minutes late to your appointment with the provider. 2. Tardiness:  What happens if I arrive only a few minutes after my scheduled appointment time? You will need to reschedule your appointment. The cutoff is your appointment time. This is why it is so important that you arrive at least 30 minutes before that appointment. If you have an appointment scheduled for 10:00 AM and you arrive at 10:01, you will be required to reschedule your appointment.  3. Plan ahead:  Always assume that you will encounter traffic on your way in. Plan for it. If you are dependent on a driver, make sure they understand these rules and the need to arrive early. 4. Other appointments and responsibilities:  Avoid scheduling any other appointments before or after your pain clinic appointments.  5. Be prepared:  Write down everything that you need to discuss with your healthcare provider and give this information to the admitting nurse. Write down the medications that you will need refilled. Bring your pills and bottles (even the empty ones), to all of your appointments, except for those where a  procedure is scheduled. 6. No children or pets:  Find someone to take care of them. It is not appropriate to bring them in. 7. Scheduling changes:  We request "advanced notification" of any changes or cancellations. 8. Advanced notification:  Defined as a time period of more than 24 hours prior to the originally scheduled appointment. This allows for the appointment to be offered to other patients. 9. Rescheduling:  When a visit is rescheduled, it will require the cancellation of the original appointment. For this reason they both fall within the category of "Cancellations".  10. Cancellations:  They require advanced notification. Any cancellation less than 24 hours before the  appointment will be recorded as a "No Show". 11. No Show:  Defined as an unkept appointment where the patient failed to notify or declare to the practice their intention or inability to keep the appointment.  Corrective process for repeat offenders:  1. Tardiness: Three (3) episodes of rescheduling due to late arrivals will be recorded as one (1) "No Show". 2. Cancellation or reschedule: Three (3) cancellations or rescheduling will be recorded as one (1) "No Show". 3. "No Shows": Three (3) "No Shows" within a 12 month period will result in discharge from the practice. ____________________________________________________________________________________________   ______________________________________________________________________________________________  Specialty Pain Scale  Introduction:  There are significant differences in how pain is reported. The word pain usually refers to physical pain, but it is also a common synonym of suffering. The medical community uses a scale from 0 (zero) to 10 (ten) to report pain level. Zero (0) is described as "no pain", while ten (10) is described as "the worse pain you can imagine". The problem with this scale is that physical  pain is reported along with suffering. Suffering  refers to mental pain, or more often yet it refers to any unpleasant feeling, emotion or aversion associated with the perception of harm or threat of harm. It is the psychological component of pain.  Pain Specialists prefer to separate the two components. The pain scale used by this practice is the Verbal Numerical Rating Scale (VNRS-11). This scale is for the physical pain only. DO NOT INCLUDE how your pain psychologically affects you. This scale is for adults 4 years of age and older. It has 11 (eleven) levels. The 1st level is 0/10. This means: "right now, I have no pain". In the context of pain management, it also means: "right now, my physical pain is under control with the current therapy".  General Information:  The scale should reflect your current level of pain. Unless you are specifically asked for the level of your worst pain, or your average pain. If you are asked for one of these two, then it should be understood that it is over the past 24 hours.  Levels 1 (one) through 5 (five) are described below, and can be treated as an outpatient. Ambulatory pain management facilities such as ours are more than adequate to treat these levels. Levels 6 (six) through 10 (ten) are also described below, however, these must be treated as a hospitalized patient. While levels 6 (six) and 7 (seven) may be evaluated at an urgent care facility, levels 8 (eight) through 10 (ten) constitute medical emergencies and as such, they belong in a hospital's emergency department. When having these levels (as described below), do not come to our office. Our facility is not equipped to manage these levels. Go directly to an urgent care facility or an emergency department to be evaluated.  Definitions:  Activities of Daily Living (ADL): Activities of daily living (ADL or ADLs) is a term used in healthcare to refer to people's daily self-care activities. Health professionals often use a person's ability or inability to perform  ADLs as a measurement of their functional status, particularly in regard to people post injury, with disabilities and the elderly. There are two ADL levels: Basic and Instrumental. Basic Activities of Daily Living (BADL  or BADLs) consist of self-care tasks that include: Bathing and showering; personal hygiene and grooming (including brushing/combing/styling hair); dressing; Toilet hygiene (getting to the toilet, cleaning oneself, and getting back up); eating and self-feeding (not including cooking or chewing and swallowing); functional mobility, often referred to as "transferring", as measured by the ability to walk, get in and out of bed, and get into and out of a chair; the broader definition (moving from one place to another while performing activities) is useful for people with different physical abilities who are still able to get around independently. Basic ADLs include the things many people do when they get up in the morning and get ready to go out of the house: get out of bed, go to the toilet, bathe, dress, groom, and eat. On the average, loss of function typically follows a particular order. Hygiene is the first to go, followed by loss of toilet use and locomotion. The last to go is the ability to eat. When there is only one remaining area in which the person is independent, there is a 62.9% chance that it is eating and only a 3.5% chance that it is hygiene. Instrumental Activities of Daily Living (IADL or IADLs) are not necessary for fundamental functioning, but they let an individual live independently in  a community. IADL consist of tasks that include: cleaning and maintaining the house; home establishment and maintenance; care of others (including selecting and supervising caregivers); care of pets; child rearing; managing money; managing financials (investments, etc.); meal preparation and cleanup; shopping for groceries and necessities; moving within the community; safety procedures and  emergency responses; health management and maintenance (taking prescribed medications); and using the telephone or other form of communication.  Instructions:  Most patients tend to report their pain as a combination of two factors, their physical pain and their psychosocial pain. This last one is also known as "suffering" and it is reflection of how physical pain affects you socially and psychologically. From now on, report them separately.  From this point on, when asked to report your pain level, report only your physical pain. Use the following table for reference.  Pain Clinic Pain Levels (0-5/10)  Pain Level Score  Description  No Pain 0   Mild pain 1 Nagging, annoying, but does not interfere with basic activities of daily living (ADL). Patients are able to eat, bathe, get dressed, toileting (being able to get on and off the toilet and perform personal hygiene functions), transfer (move in and out of bed or a chair without assistance), and maintain continence (able to control bladder and bowel functions). Blood pressure and heart rate are unaffected. A normal heart rate for a healthy adult ranges from 60 to 100 bpm (beats per minute).   Mild to moderate pain 2 Noticeable and distracting. Impossible to hide from other people. More frequent flare-ups. Still possible to adapt and function close to normal. It can be very annoying and may have occasional stronger flare-ups. With discipline, patients may get used to it and adapt.   Moderate pain 3 Interferes significantly with activities of daily living (ADL). It becomes difficult to feed, bathe, get dressed, get on and off the toilet or to perform personal hygiene functions. Difficult to get in and out of bed or a chair without assistance. Very distracting. With effort, it can be ignored when deeply involved in activities.   Moderately severe pain 4 Impossible to ignore for more than a few minutes. With effort, patients may still be able to manage  work or participate in some social activities. Very difficult to concentrate. Signs of autonomic nervous system discharge are evident: dilated pupils (mydriasis); mild sweating (diaphoresis); sleep interference. Heart rate becomes elevated (>115 bpm). Diastolic blood pressure (lower number) rises above 100 mmHg. Patients find relief in laying down and not moving.   Severe pain 5 Intense and extremely unpleasant. Associated with frowning face and frequent crying. Pain overwhelms the senses.  Ability to do any activity or maintain social relationships becomes significantly limited. Conversation becomes difficult. Pacing back and forth is common, as getting into a comfortable position is nearly impossible. Pain wakes you up from deep sleep. Physical signs will be obvious: pupillary dilation; increased sweating; goosebumps; brisk reflexes; cold, clammy hands and feet; nausea, vomiting or dry heaves; loss of appetite; significant sleep disturbance with inability to fall asleep or to remain asleep. When persistent, significant weight loss is observed due to the complete loss of appetite and sleep deprivation.  Blood pressure and heart rate becomes significantly elevated. Caution: If elevated blood pressure triggers a pounding headache associated with blurred vision, then the patient should immediately seek attention at an urgent or emergency care unit, as these may be signs of an impending stroke.    Emergency Department Pain Levels (6-10/10)  Emergency Room Pain  6 Severely limiting. Requires emergency care and should not be seen or managed at an outpatient pain management facility. Communication becomes difficult and requires great effort. Assistance to reach the emergency department may be required. Facial flushing and profuse sweating along with potentially dangerous increases in heart rate and blood pressure will be evident.   Distressing pain 7 Self-care is very difficult. Assistance is required to  transport, or use restroom. Assistance to reach the emergency department will be required. Tasks requiring coordination, such as bathing and getting dressed become very difficult.   Disabling pain 8 Self-care is no longer possible. At this level, pain is disabling. The individual is unable to do even the most "basic" activities such as walking, eating, bathing, dressing, transferring to a bed, or toileting. Fine motor skills are lost. It is difficult to think clearly.   Incapacitating pain 9 Pain becomes incapacitating. Thought processing is no longer possible. Difficult to remember your own name. Control of movement and coordination are lost.   The worst pain imaginable 10 At this level, most patients pass out from pain. When this level is reached, collapse of the autonomic nervous system occurs, leading to a sudden drop in blood pressure and heart rate. This in turn results in a temporary and dramatic drop in blood flow to the brain, leading to a loss of consciousness. Fainting is one of the body's self defense mechanisms. Passing out puts the brain in a calmed state and causes it to shut down for a while, in order to begin the healing process.    Summary: 1. Refer to this scale when providing [place] with your pain level. 2. Be accurate and careful when reporting your pain level. This will help with your care. 3. Over-reporting your pain level will lead to loss of credibility. 4. Even a level of 1/10 means that there is pain and will be treated at our facility. 5. High, inaccurate reporting will be documented as "Symptom Exaggeration", leading to loss of credibility and suspicions of possible secondary gains such as obtaining more narcotics, or wanting to appear disabled, for fraudulent reasons. 6. Only pain levels of 5 or below will be seen at our facility. 7. Pain levels of 6 and above will be sent to the Emergency Department and the appointment  cancelled. ______________________________________________________________________________________________    BMI Assessment: Estimated body mass index is 56.25 kg/m as calculated from the following:   Height as of this encounter: _0  (1.626 m).   Weight as of this encounter: 327 lb 11.2 oz (148.6 kg).  BMI interpretation table: BMI level Category Range association with higher incidence of chronic pain  <18 kg/m2 Underweight   18.5-24.9 kg/m2 Ideal body weight   25-29.9 kg/m2 Overweight Increased incidence by 20%  30-34.9 kg/m2 Obese (Class I) Increased incidence by 68%  35-39.9 kg/m2 Severe obesity (Class II) Increased incidence by 136%  >40 kg/m2 Extreme obesity (Class III) Increased incidence by 254%   BMI Readings from Last 4 Encounters:  09/14/18 56.25 kg/m  08/03/18 53.90 kg/m  07/12/18 54.75 kg/m  01/12/18 51.70 kg/m   Wt Readings from Last 4 Encounters:  09/14/18 (!) 327 lb 11.2 oz (148.6 kg)  08/03/18 (!) 314 lb (142.4 kg)  07/12/18 (!) 314 lb (142.4 kg)  01/12/18 296 lb 8 oz (134.5 kg)

## 2018-09-14 NOTE — Patient Instructions (Signed)
____________________________________________________________________________________________  Appointment Policy Summary  It is our goal and responsibility to provide the medical community with assistance in the evaluation and management of patients with chronic pain. Unfortunately our resources are limited. Because we do not have an unlimited amount of time, or available appointments, we are required to closely monitor and manage their use. The following rules exist to maximize their use:  Patient's responsibilities: 1. Punctuality:  At what time should I arrive? You should be physically present in our office 30 minutes before your scheduled appointment. Your scheduled appointment is with your assigned healthcare provider. However, it takes 5-10 minutes to be "checked-in", and another 15 minutes for the nurses to do the admission. If you arrive to our office at the time you were given for your appointment, you will end up being at least 20-25 minutes late to your appointment with the provider. 2. Tardiness:  What happens if I arrive only a few minutes after my scheduled appointment time? You will need to reschedule your appointment. The cutoff is your appointment time. This is why it is so important that you arrive at least 30 minutes before that appointment. If you have an appointment scheduled for 10:00 AM and you arrive at 10:01, you will be required to reschedule your appointment.  3. Plan ahead:  Always assume that you will encounter traffic on your way in. Plan for it. If you are dependent on a driver, make sure they understand these rules and the need to arrive early. 4. Other appointments and responsibilities:  Avoid scheduling any other appointments before or after your pain clinic appointments.  5. Be prepared:  Write down everything that you need to discuss with your healthcare provider and give this information to the admitting nurse. Write down the medications that you will need  refilled. Bring your pills and bottles (even the empty ones), to all of your appointments, except for those where a procedure is scheduled. 6. No children or pets:  Find someone to take care of them. It is not appropriate to bring them in. 7. Scheduling changes:  We request "advanced notification" of any changes or cancellations. 8. Advanced notification:  Defined as a time period of more than 24 hours prior to the originally scheduled appointment. This allows for the appointment to be offered to other patients. 9. Rescheduling:  When a visit is rescheduled, it will require the cancellation of the original appointment. For this reason they both fall within the category of "Cancellations".  10. Cancellations:  They require advanced notification. Any cancellation less than 24 hours before the  appointment will be recorded as a "No Show". 11. No Show:  Defined as an unkept appointment where the patient failed to notify or declare to the practice their intention or inability to keep the appointment.  Corrective process for repeat offenders:  1. Tardiness: Three (3) episodes of rescheduling due to late arrivals will be recorded as one (1) "No Show". 2. Cancellation or reschedule: Three (3) cancellations or rescheduling will be recorded as one (1) "No Show". 3. "No Shows": Three (3) "No Shows" within a 12 month period will result in discharge from the practice. ____________________________________________________________________________________________   ______________________________________________________________________________________________  Specialty Pain Scale  Introduction:  There are significant differences in how pain is reported. The word pain usually refers to physical pain, but it is also a common synonym of suffering. The medical community uses a scale from 0 (zero) to 10 (ten) to report pain level. Zero (0) is described as "no pain",   while ten (10) is described as "the worse pain  you can imagine". The problem with this scale is that physical pain is reported along with suffering. Suffering refers to mental pain, or more often yet it refers to any unpleasant feeling, emotion or aversion associated with the perception of harm or threat of harm. It is the psychological component of pain.  Pain Specialists prefer to separate the two components. The pain scale used by this practice is the Verbal Numerical Rating Scale (VNRS-11). This scale is for the physical pain only. DO NOT INCLUDE how your pain psychologically affects you. This scale is for adults 21 years of age and older. It has 11 (eleven) levels. The 1st level is 0/10. This means: "right now, I have no pain". In the context of pain management, it also means: "right now, my physical pain is under control with the current therapy".  General Information:  The scale should reflect your current level of pain. Unless you are specifically asked for the level of your worst pain, or your average pain. If you are asked for one of these two, then it should be understood that it is over the past 24 hours.  Levels 1 (one) through 5 (five) are described below, and can be treated as an outpatient. Ambulatory pain management facilities such as ours are more than adequate to treat these levels. Levels 6 (six) through 10 (ten) are also described below, however, these must be treated as a hospitalized patient. While levels 6 (six) and 7 (seven) may be evaluated at an urgent care facility, levels 8 (eight) through 10 (ten) constitute medical emergencies and as such, they belong in a hospital's emergency department. When having these levels (as described below), do not come to our office. Our facility is not equipped to manage these levels. Go directly to an urgent care facility or an emergency department to be evaluated.  Definitions:  Activities of Daily Living (ADL): Activities of daily living (ADL or ADLs) is a term used in healthcare to refer to  people's daily self-care activities. Health professionals often use a person's ability or inability to perform ADLs as a measurement of their functional status, particularly in regard to people post injury, with disabilities and the elderly. There are two ADL levels: Basic and Instrumental. Basic Activities of Daily Living (BADL  or BADLs) consist of self-care tasks that include: Bathing and showering; personal hygiene and grooming (including brushing/combing/styling hair); dressing; Toilet hygiene (getting to the toilet, cleaning oneself, and getting back up); eating and self-feeding (not including cooking or chewing and swallowing); functional mobility, often referred to as "transferring", as measured by the ability to walk, get in and out of bed, and get into and out of a chair; the broader definition (moving from one place to another while performing activities) is useful for people with different physical abilities who are still able to get around independently. Basic ADLs include the things many people do when they get up in the morning and get ready to go out of the house: get out of bed, go to the toilet, bathe, dress, groom, and eat. On the average, loss of function typically follows a particular order. Hygiene is the first to go, followed by loss of toilet use and locomotion. The last to go is the ability to eat. When there is only one remaining area in which the person is independent, there is a 62.9% chance that it is eating and only a 3.5% chance that it is hygiene. Instrumental Activities   of Daily Living (IADL or IADLs) are not necessary for fundamental functioning, but they let an individual live independently in a community. IADL consist of tasks that include: cleaning and maintaining the house; home establishment and maintenance; care of others (including selecting and supervising caregivers); care of pets; child rearing; managing money; managing financials (investments, etc.); meal preparation  and cleanup; shopping for groceries and necessities; moving within the community; safety procedures and emergency responses; health management and maintenance (taking prescribed medications); and using the telephone or other form of communication.  Instructions:  Most patients tend to report their pain as a combination of two factors, their physical pain and their psychosocial pain. This last one is also known as "suffering" and it is reflection of how physical pain affects you socially and psychologically. From now on, report them separately.  From this point on, when asked to report your pain level, report only your physical pain. Use the following table for reference.  Pain Clinic Pain Levels (0-5/10)  Pain Level Score  Description  No Pain 0   Mild pain 1 Nagging, annoying, but does not interfere with basic activities of daily living (ADL). Patients are able to eat, bathe, get dressed, toileting (being able to get on and off the toilet and perform personal hygiene functions), transfer (move in and out of bed or a chair without assistance), and maintain continence (able to control bladder and bowel functions). Blood pressure and heart rate are unaffected. A normal heart rate for a healthy adult ranges from 60 to 100 bpm (beats per minute).   Mild to moderate pain 2 Noticeable and distracting. Impossible to hide from other people. More frequent flare-ups. Still possible to adapt and function close to normal. It can be very annoying and may have occasional stronger flare-ups. With discipline, patients may get used to it and adapt.   Moderate pain 3 Interferes significantly with activities of daily living (ADL). It becomes difficult to feed, bathe, get dressed, get on and off the toilet or to perform personal hygiene functions. Difficult to get in and out of bed or a chair without assistance. Very distracting. With effort, it can be ignored when deeply involved in activities.   Moderately severe pain  4 Impossible to ignore for more than a few minutes. With effort, patients may still be able to manage work or participate in some social activities. Very difficult to concentrate. Signs of autonomic nervous system discharge are evident: dilated pupils (mydriasis); mild sweating (diaphoresis); sleep interference. Heart rate becomes elevated (>115 bpm). Diastolic blood pressure (lower number) rises above 100 mmHg. Patients find relief in laying down and not moving.   Severe pain 5 Intense and extremely unpleasant. Associated with frowning face and frequent crying. Pain overwhelms the senses.  Ability to do any activity or maintain social relationships becomes significantly limited. Conversation becomes difficult. Pacing back and forth is common, as getting into a comfortable position is nearly impossible. Pain wakes you up from deep sleep. Physical signs will be obvious: pupillary dilation; increased sweating; goosebumps; brisk reflexes; cold, clammy hands and feet; nausea, vomiting or dry heaves; loss of appetite; significant sleep disturbance with inability to fall asleep or to remain asleep. When persistent, significant weight loss is observed due to the complete loss of appetite and sleep deprivation.  Blood pressure and heart rate becomes significantly elevated. Caution: If elevated blood pressure triggers a pounding headache associated with blurred vision, then the patient should immediately seek attention at an urgent or emergency care unit, as   these may be signs of an impending stroke.    Emergency Department Pain Levels (6-10/10)  Emergency Room Pain 6 Severely limiting. Requires emergency care and should not be seen or managed at an outpatient pain management facility. Communication becomes difficult and requires great effort. Assistance to reach the emergency department may be required. Facial flushing and profuse sweating along with potentially dangerous increases in heart rate and blood pressure  will be evident.   Distressing pain 7 Self-care is very difficult. Assistance is required to transport, or use restroom. Assistance to reach the emergency department will be required. Tasks requiring coordination, such as bathing and getting dressed become very difficult.   Disabling pain 8 Self-care is no longer possible. At this level, pain is disabling. The individual is unable to do even the most "basic" activities such as walking, eating, bathing, dressing, transferring to a bed, or toileting. Fine motor skills are lost. It is difficult to think clearly.   Incapacitating pain 9 Pain becomes incapacitating. Thought processing is no longer possible. Difficult to remember your own name. Control of movement and coordination are lost.   The worst pain imaginable 10 At this level, most patients pass out from pain. When this level is reached, collapse of the autonomic nervous system occurs, leading to a sudden drop in blood pressure and heart rate. This in turn results in a temporary and dramatic drop in blood flow to the brain, leading to a loss of consciousness. Fainting is one of the body's self defense mechanisms. Passing out puts the brain in a calmed state and causes it to shut down for a while, in order to begin the healing process.    Summary: 1. Refer to this scale when providing [place] with your pain level. 2. Be accurate and careful when reporting your pain level. This will help with your care. 3. Over-reporting your pain level will lead to loss of credibility. 4. Even a level of 1/10 means that there is pain and will be treated at our facility. 5. High, inaccurate reporting will be documented as "Symptom Exaggeration", leading to loss of credibility and suspicions of possible secondary gains such as obtaining more narcotics, or wanting to appear disabled, for fraudulent reasons. 6. Only pain levels of 5 or below will be seen at our facility. 7. Pain levels of 6 and above will be sent to the  Emergency Department and the appointment cancelled. ______________________________________________________________________________________________    BMI Assessment: Estimated body mass index is 56.25 kg/m as calculated from the following:   Height as of this encounter: 5\' 4"  (1.626 m).   Weight as of this encounter: 327 lb 11.2 oz (148.6 kg).  BMI interpretation table: BMI level Category Range association with higher incidence of chronic pain  <18 kg/m2 Underweight   18.5-24.9 kg/m2 Ideal body weight   25-29.9 kg/m2 Overweight Increased incidence by 20%  30-34.9 kg/m2 Obese (Class I) Increased incidence by 68%  35-39.9 kg/m2 Severe obesity (Class II) Increased incidence by 136%  >40 kg/m2 Extreme obesity (Class III) Increased incidence by 254%   BMI Readings from Last 4 Encounters:  09/14/18 56.25 kg/m  08/03/18 53.90 kg/m  07/12/18 54.75 kg/m  01/12/18 51.70 kg/m   Wt Readings from Last 4 Encounters:  09/14/18 (!) 327 lb 11.2 oz (148.6 kg)  08/03/18 (!) 314 lb (142.4 kg)  07/12/18 (!) 314 lb (142.4 kg)  01/12/18 296 lb 8 oz (134.5 kg)

## 2018-09-14 NOTE — Progress Notes (Signed)
Safety precautions to be maintained throughout the outpatient stay will include: orient to surroundings, keep bed in low position, maintain call bell within reach at all times, provide assistance with transfer out of bed and ambulation.  

## 2018-09-15 ENCOUNTER — Ambulatory Visit
Admission: RE | Admit: 2018-09-15 | Discharge: 2018-09-15 | Disposition: A | Discharge status: Home / Self Care | Payer: Medicare Other | Source: Ambulatory Visit | Attending: Neurosurgery | Admitting: Neurosurgery

## 2018-09-15 DIAGNOSIS — M5136 Other intervertebral disc degeneration, lumbar region: Secondary | ICD-10-CM | POA: Diagnosis not present

## 2018-09-15 DIAGNOSIS — M4316 Spondylolisthesis, lumbar region: Secondary | ICD-10-CM

## 2018-09-15 DIAGNOSIS — M48061 Spinal stenosis, lumbar region without neurogenic claudication: Secondary | ICD-10-CM | POA: Diagnosis not present

## 2018-09-15 DIAGNOSIS — T84226A Displacement of internal fixation device of vertebrae, initial encounter: Secondary | ICD-10-CM | POA: Diagnosis not present

## 2018-09-15 DIAGNOSIS — M5126 Other intervertebral disc displacement, lumbar region: Secondary | ICD-10-CM | POA: Diagnosis not present

## 2018-09-15 DIAGNOSIS — X58XXXA Exposure to other specified factors, initial encounter: Secondary | ICD-10-CM | POA: Diagnosis not present

## 2018-09-15 MED ORDER — GADOBUTROL 1 MMOL/ML IV SOLN
10.0000 mL | Freq: Once | INTRAVENOUS | Status: AC | PRN
  Administered 2018-09-15: 10 mL via INTRAVENOUS

## 2018-09-17 LAB — C-REACTIVE PROTEIN: CRP: 5 mg/L (ref 0–10)

## 2018-09-17 LAB — COMP. METABOLIC PANEL (12)
AST: 18 IU/L (ref 0–40)
Albumin/Globulin Ratio: 2 (ref 1.2–2.2)
Albumin: 4.7 g/dL (ref 3.6–4.8)
Alkaline Phosphatase: 74 IU/L (ref 39–117)
BUN/Creatinine Ratio: 28 (ref 12–28)
BUN: 16 mg/dL (ref 8–27)
Bilirubin Total: 0.4 mg/dL (ref 0.0–1.2)
Calcium: 10 mg/dL (ref 8.7–10.3)
Chloride: 92 mmol/L — ABNORMAL LOW (ref 96–106)
Creatinine, Ser: 0.58 mg/dL (ref 0.57–1.00)
GFR calc Af Amer: 111 mL/min/{1.73_m2} (ref >59)
GFR calc non Af Amer: 96 mL/min/{1.73_m2} (ref >59)
Globulin, Total: 2.3 g/dL (ref 1.5–4.5)
Glucose: 114 mg/dL — ABNORMAL HIGH (ref 65–99)
Potassium: 4.6 mmol/L (ref 3.5–5.2)
Sodium: 131 mmol/L — ABNORMAL LOW (ref 134–144)
Total Protein: 7 g/dL (ref 6.0–8.5)

## 2018-09-17 LAB — 25-HYDROXY VITAMIN D LCMS D2+D3
25-Hydroxy, Vitamin D-2: <1 ng/mL
25-Hydroxy, Vitamin D-3: 25 ng/mL
25-Hydroxy, Vitamin D: 25 ng/mL — ABNORMAL LOW

## 2018-09-17 LAB — MAGNESIUM: Magnesium: 1.9 mg/dL (ref 1.6–2.3)

## 2018-09-17 LAB — COMPLIANCE DRUG ANALYSIS, UR

## 2018-09-17 LAB — SEDIMENTATION RATE: Sed Rate: 21 mm/hr (ref 0–40)

## 2018-09-17 LAB — VITAMIN B12: Vitamin B-12: 739 pg/mL (ref 232–1245)

## 2018-09-24 DIAGNOSIS — J039 Acute tonsillitis, unspecified: Secondary | ICD-10-CM | POA: Diagnosis not present

## 2018-09-24 DIAGNOSIS — J322 Chronic ethmoidal sinusitis: Secondary | ICD-10-CM | POA: Diagnosis not present

## 2018-09-24 DIAGNOSIS — J370 Chronic laryngitis: Secondary | ICD-10-CM | POA: Diagnosis not present

## 2018-09-24 DIAGNOSIS — J320 Chronic maxillary sinusitis: Secondary | ICD-10-CM | POA: Diagnosis not present

## 2018-10-10 NOTE — Progress Notes (Signed)
Patient's Name: LALLA LAHAM  MRN: [id number]  Referring Provider: Ria Bush, MD  DOB: [birth year]  PCP: Ria Bush, MD  DOS: 10/13/2018  Note by: Gaspar Cola, MD  Service setting: Ambulatory outpatient  Specialty: Interventional Pain Management  Location: [facility] (AMB) Pain Management Facility    Patient type: Established   Primary Reason(s) for Visit: Encounter for evaluation before starting new chronic pain management plan of care (Level of risk: moderate) CC: Back Pain  HPI  Ms. Rolland is a 68 y.o. year old, female patient, who comes today for a follow-up evaluation to review the test results and decide on a treatment plan. She has Hypothyroidism; DM type 2 with diabetic peripheral neuropathy ([place]); Hypercholesterolemia; ANEMIA-NOS; Bipolar disorder ([place]); Benign positional vertigo; Essential hypertension; Allergic rhinitis; Asthma; GERD; Osteoarthritis; HIP PAIN; OSA on CPAP; Cervical radiculopathy (Right); Morbid obesity with BMI of 50.0-59.9, adult ([place]); Urinary urgency; MRSA (methicillin resistant Staphylococcus aureus) infection; Abdominal aortic atherosclerosis ([place]); Pedal edema; Chronic pain syndrome; Degenerative joint disease (DJD) of hip; Somatic dysfunction of sacroiliac joint; S/P Lumbar Fusion (L3-4 PLIF); Cervical fusion syndrome; DOE (dyspnea on exertion); Chronic lumbar radicular pain (L4) (Bilateral); Welcome to Medicare preventive visit; Ex-smoker; Nonspecific abnormal electrocardiogram (ECG) (EKG); CAD (coronary artery disease); Centrilobular emphysema ([place]); Thoracic aortic atherosclerosis ([place]); Synovial cyst of lumbar facet joint; Advanced care planning/counseling discussion; PAH (pulmonary artery hypertension) ([place]); 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; Vaginal itching; Hip pain; Infection or inflammatory reaction due to internal joint prosthesis ([place]); Primary osteoarthritis of first carpometacarpal joint of left hand; Chronic low back pain (Primary Area of  Pain) (Bilateral) (L>R) w/ sciatica (Left); Chronic lower extremity pain (Secondary Area of Pain) (Left); Chronic hip pain (Tertiary Area of Pain) (Bilateral) (L>R); Chronic knee pain (Fourth Area of Pain) (Bilateral) (R>L); Pharmacologic therapy; Disorder of skeletal system; Problems influencing health status; Failed back surgical syndrome; History of hip replacement (Left); History of back surgery; Vitamin D insufficiency; Osteoarthritis of facet joint of lumbar spine; Osteoarthritis involving multiple joints; Cervical radiculopathy (Left); Lumbar facet arthropathy (Bilateral); Lumbar facet syndrome (Bilateral); Abnormal MRI, lumbar spine (09/16/2018); Lumbar nerve root compression (L4) (Left); Lumbar foraminal stenosis; and Chronic hip pain after total replacement (Left) on their problem list. Her primarily concern today is the Back Pain  Pain Assessment: Location: Right, Lower Back(both knee) Radiating: pain radiaties down legs at times, hips hurts at times Onset: More than a month ago Duration: Chronic pain Quality: Sharp, Aching, Throbbing, Constant Severity: 4 /10 (subjective, self-reported pain score)  Note: Reported level is compatible with observation.                         When using our objective Pain Scale, levels between 6 and 10/10 are said to belong in an emergency room, as it progressively worsens from a 6/10, described as severely limiting, requiring emergency care not usually available at an outpatient pain management facility. At a 6/10 level, communication becomes difficult and requires great effort. Assistance to reach the emergency department may be required. Facial flushing and profuse sweating along with potentially dangerous increases in heart rate and blood pressure will be evident. Effect on ADL: no prolonged standing, walking in parking lot, difficulty with cooking Timing: Constant Modifying factors: sitting, heat and medications, TENS unit BP: (!) 164/79  HR: 75  Ms.  Stecher comes in today for a follow-up visit after her initial evaluation on 09/14/2018. Today we went over the results of her tests. These were explained in "L[place]man's  terms". During today's appointment we went over my diagnostic impression, as well as the proposed treatment plan.  According to the patient her primary area of pain (constant) is in her lower back(B)(ML,R>L). She admits that this started this summer. She denies any injury or fall. She is s/p lumbar spinal surgery L3-4 2013 by Dr. [name]. She admits the surgery was effective. She denies any previous interventional therapy before or after surgery.  Her last physical therapy was in 2013. She did have a recent x-ray. MRI recently done. Pending evaluation by Dr. [name] in a couple of weeks.  Her second area of pain (intermittent) is in her leg(B)(L>R).  She admits the pain goes down into the front of her thigh.  She does have some numbness.  She denies nerve conduction study.  She feels like she did do some physical therapy in 2015. RLEP - Stops at knee cap, running over anterior thigh area. LLEP - stops at the knee, running thru the anterior thigh.  Her third area pain is in her hips(B)(R>L) she admits the left is greater than the right.  The left is sharper, as the right is a "functional" (meaning a pain that is present with the use of the joint) pain. She is s/p left total hip replacement, 1st one in [place], around 2002 (Dr. Krystal Clark). Second one, 10 years later, in [facility] 2012, by Dr. Rudene Christians (did not cement right and it was loose) with a 3rd revision in 2014, by Dr. Royden Purl in [place], due to prosthetic deterioration.  She denies any previous interventional therapy.  Physical therapy last completed early part of 2015 she is unsure of recent images.  Her fourth area of pain is in her knees(B)(R>L).  She admits the right is greater than the left.  She was told she will need replacement for both knees. (treated by Dr. Lorre Nick) She admits that she has  been told that the left knee looks worse than the right.  She admits that she has to lose 100 pounds in order to have any surgeries for her knees.  She has had steroid injections (by Dr. Lorre Nick in [place], about 2-3 months ago) in the past along with gel injections last being in 2014 completed in [place]. In addition to the knee pain, she has been experiencing instability in both knees. She has not had any recent physical therapy x-rays unsure.  In considering the treatment plan options, Ms. Geers was reminded that I no longer take patients for medication management only. I asked her to let me know if she had no intention of taking advantage of the interventional therapies, so that we could make arrangements to provide this space to someone interested. I also made it clear that undergoing interventional therapies for the purpose of getting pain medications is very inappropriate on the part of a patient, and it will not be tolerated in this practice. This type of behavior would suggest true addiction and therefore it requires referral to an addiction specialist.   Further details on both, my assessment(s), as well as the proposed treatment plan, please see below.  Controlled Substance Pharmacotherapy Assessment REMS (Risk Evaluation and Mitigation Strategy)  Analgesic: None Highest recorded MME/day: 106.67 mg/day MME/day: 0 mg/day  Pill Count: None expected due to no prior prescriptions written by our practice. No notes on file Pharmacokinetics: Liberation and absorption (onset of action): WNL Distribution (time to peak effect): WNL Metabolism and excretion (duration of action): WNL         Pharmacodynamics: Desired  effects: Analgesia: Ms. Cales reports >50% benefit. Functional ability: Patient reports that medication allows her to accomplish basic ADLs Clinically meaningful improvement in function (CMIF): Sustained CMIF goals met Perceived effectiveness: Described as relatively effective, allowing  for increase in activities of daily living (ADL) Undesirable effects: Side-effects or Adverse reactions: None reported Monitoring: [place] PMP: Online review of the past 59-monthperiod previously conducted. Not applicable at this point since we have not taken over the patient's medication management yet. List of other Serum/Urine Drug Screening Test(s):  No results found. List of all UDS test(s) done:  Lab Results  Component Value Date   TOXASSSELUR FINAL 04/17/2016   SUMMARY FINAL 09/14/2018   Last UDS on record: ToxAssure Select 13  Date Value Ref Range Status  04/17/2016 FINAL  Final    Comment:    ==================================================================== TOXASSURE SELECT 13 (MW) ==================================================================== Test                             Result       Flag       Units Drug Absent but Declared for Prescription Verification   Oxycodone                      Not Detected UNEXPECTED ng/mg creat ==================================================================== Test                      Result    Flag   Units      Ref Range   Creatinine              90               mg/dL      >=20 ==================================================================== Declared Medications:  The flagging and interpretation on this report are based on the  following declared medications.  Unexpected results may arise from  inaccuracies in the declared medications.  **Note: The testing scope of this panel includes these medications:  Oxycodone (Oxycodone Acetaminophen)  **Note: The testing scope of this panel does not include following  reported medications:  Acetaminophen (Oxycodone Acetaminophen)  Albuterol  Aspirin  Calcium Carbonate (Calcium Carb/Vitamin D)  Celecoxib  Chondroitin  Clotrimazole  Doxycycline  Fluconazole  Fluticasone  Furosemide  Glucosamine  Lamotrigine  Levothyroxine  Linaclotide  Liraglutide  Losartan  Metformin   Montelukast  Multivitamin  Ondansetron  Oxcarbazepine  Pantoprazole  Polyethylene Glycol  Salmeterol  Simvastatin  Vitamin D (Calcium Carb/Vitamin D)  Zaleplon ==================================================================== For clinical consultation, please call ([phone] ====================================================================    Summary  Date Value Ref Range Status  09/14/2018 FINAL  Final    Comment:    ==================================================================== TOXASSURE COMP DRUG ANALYSIS,UR ==================================================================== Test                             Result       Flag       Units Drug Present   Oxcarbazepine MHD              PRESENT    Oxcarbazepine MHD is the active metabolite of oxcarbazepine and    eslicarbazepine.   Lamotrigine                    PRESENT   Fluoxetine                     PRESENT   Norfluoxetine  PRESENT    Norfluoxetine is an expected metabolite of fluoxetine.   Acetaminophen                  PRESENT   Dextrorphan/Levorphanol        PRESENT    Dextrorphan is an expected metabolite of dextromethorphan, an    over-the-counter or prescription cough suppressant. Levorphanol    is a scheduled prescription medication. Dextrorphan cannot be    distinguished from levorphanol by the method used for analysis. ==================================================================== Test                      Result    Flag   Units      Ref Range   Creatinine              33               mg/dL      >=20 ==================================================================== Declared Medications:  Medication list was not provided. ==================================================================== For clinical consultation, please call [phone]. ====================================================================    UDS interpretation: No unexpected findings.           Medication Assessment Form: Patient introduced to form today Treatment compliance: Treatment may start today if patient agrees with proposed plan. Evaluation of compliance is not applicable at this point Risk Assessment Profile: Aberrant behavior: See initial evaluations. None observed or detected today Comorbid factors increasing risk of overdose: See initial evaluation. No additional risks detected today Opioid risk tool (ORT) (Total Score): 2 Personal History of Substance Abuse (SUD-Substance use disorder):  Alcohol: Negative  Illegal Drugs: Negative  Rx Drugs: Negative  ORT Risk Level calculation: Low Risk Risk of substance use disorder (SUD): Low Opioid Risk Tool - 10/13/18 1046      Family History of Substance Abuse   Alcohol  Negative    Illegal Drugs  Negative    Rx Drugs  Negative      Personal History of Substance Abuse   Alcohol  Negative    Illegal Drugs  Negative    Rx Drugs  Negative      Age   Age between 44-45 years   Yes      Psychological Disease   ADD  Negative    OCD  Negative    Bipolar  Negative    Schizophrenia  Negative    Depression  Positive      Total Score   Opioid Risk Tool Scoring  2    Opioid Risk Interpretation  Low Risk      ORT Scoring interpretation table:  Score <3 = Low Risk for SUD  Score between 4-7 = Moderate Risk for SUD  Score >8 = High Risk for Opioid Abuse   Risk Mitigation Strategies:  Patient opioid safety counseling: Completed today. Counseling provided to patient as per "Patient Counseling Document". Document signed by patient, attesting to counseling and understanding Patient-Prescriber Agreement (PPA): Obtained today.  Controlled substance notification to other providers: Written and sent today.  Pharmacologic Plan: Today we may be taking over the patient's pharmacological regimen. See below.             Laboratory Chemistry  Inflammation Markers (CRP: Acute Phase) (ESR: Chronic Phase) Lab Results  Component  Value Date   CRP 5 09/14/2018   ESRSEDRATE 21 09/14/2018                         Rheumatology Markers No results found.  Renal  Function Markers Lab Results  Component Value Date   BUN 16 09/14/2018   CREATININE 0.58 09/14/2018   BCR 28 09/14/2018   GFRAA 111 09/14/2018   GFRNONAA 96 09/14/2018                             Hepatic Function Markers Lab Results  Component Value Date   AST 18 09/14/2018   ALT 19 07/09/2018   ALBUMIN 4.7 09/14/2018   ALKPHOS 74 09/14/2018   HCVAB NEGATIVE 07/01/2016   LIPASE 30 11/27/2015                        Electrolytes Lab Results  Component Value Date   NA 131 (L) 09/14/2018   K 4.6 09/14/2018   CL 92 (L) 09/14/2018   CALCIUM 10.0 09/14/2018   MG 1.9 09/14/2018                        Neuropathy Markers Lab Results  Component Value Date   VITAMINB12 739 09/14/2018   HGBA1C 5.8 07/09/2018   HIV NON-REACTIVE 01/05/2018                        CNS Tests No results found.  Bone Pathology Markers Lab Results  Component Value Date   25OHVITD1 25 (L) 09/14/2018   25OHVITD2 <1.0 09/14/2018   25OHVITD3 25 09/14/2018                         Coagulation Parameters Lab Results  Component Value Date   INR 1.18 08/31/2012   LABPROT 14.8 08/31/2012   PLT 238.0 07/09/2018                        Cardiovascular Markers Lab Results  Component Value Date   CKTOTAL 18 09/04/2012   CKMB 0.6 06/30/2012   TROPONINI 0.03 11/26/2015   HGB 12.5 07/09/2018   HCT 37.3 07/09/2018                         CA Markers No results found.  Note: Lab results reviewed.  Recent Diagnostic Imaging Review  Shoulder Imaging: Shoulder-L MR w/wo contrast:  Results for orders placed during the hospital encounter of 10/14/12  MR Shoulder Left W Wo Contrast   Narrative *RADIOLOGY REPORT*  Clinical Data:  Left shoulder pain.  History of previous leg abscess.  MRI LEFT SHOULDER WITH AND WITHOUT CONTRAST  Technique:  Multiplanar, multisequence  MR imaging of the left shoulder was performed before and after the administration of intravenous contrast.  Contrast: 2m MULTIHANCE GADOBENATE DIMEGLUMINE 529 MG/ML IV SOLN  Comparison:  None  Findings: Mild to moderate rotator cuff tendinopathy/tendinosis. There is a shallow articular surface tear involving the anterior aspect of the supraspinatus tendon but no discrete full-thickness rotator cuff tear.  The subscapularis tendon is intact. Interstitial tears are noted.  The long head biceps tendon is intact.  The glenoid labra are grossly normal.  Mild AC joint degenerative changes with a small amount of fluid in the joint but no findings to suggest septic arthritis or osteomyelitis.  Mild glenohumeral joint degenerative changes but no joint effusion or findings for septic arthritis.  Minimal subacromial/subdeltoid bursitis.  IMPRESSION:  1.  Mild to moderate rotator cuff tendinopathy/tendinosis.  Shallow articular surface tear  involving the anterior aspect of the supraspinatus tendon. 2.  The AC joint and glenohumeral joint degenerative changes but no findings to suggest septic arthritis or osteomyelitis. 3.  Intact biceps tendon and glenoid labra. 4.  Mild subacromial/subdeltoid bursitis.   Original Report Authenticated By: Marijo Sanes, M.D.    Lumbosacral Imaging: Lumbar MR w/wo contrast:  Results for orders placed during the hospital encounter of 09/15/18  MR LUMBAR SPINE W WO CONTRAST   Narrative CLINICAL DATA:  Spondylolisthesis of lumbar region  EXAM: MRI LUMBAR SPINE WITHOUT AND WITH CONTRAST  TECHNIQUE: Multiplanar and multiecho pulse sequences of the lumbar spine were obtained without and with intravenous contrast.  CONTRAST:  10 cc Gadavist intravenous  COMPARISON:  10/17/2016  FINDINGS: Segmentation:  5 lumbar type vertebral bodies  Alignment:  Grade 1 anterolisthesis at L4-5, progressed.  Vertebrae: Marrow edema about the bilateral L4-5  facets. No acute fracture, discitis, or aggressive bone lesion.  Conus medullaris and cauda equina: Conus extends to the L1 level. Conus and cauda equina appear normal.  Paraspinal and other soft tissues: Dorsal scarring related to L3-4 fusion.  Disc levels:  T11-12: Disc narrowing with posterior ridging and shallow protrusion that deflects the spinal cord without compression, stable.  T12- L1: Unremarkable.  L1-L2: Unremarkable.  L2-L3: Minor facet spurring and disc bulging.  No impingement  L3-L4: PLIF with chronic pedicle screw loosening by radiography. No recurrent impingement noted  L4-L5: Progressive facet degeneration with spurring and anterolisthesis. Marrow edema about the facets as noted above. Progressive disc narrowing with a central protrusion extending to the left foramen where there is L4 compression. The right foramen and canal is patent  L5-S1:Mild to moderate facet spurring.  No impingement  IMPRESSION: 1. L4-5 progressive disc and facet degeneration since 2018 with active facet arthritis on both sides. A new central to left foraminal disc protrusion causes L4 foraminal compression. 2. L3-4 PLIF with chronic prominent screw loosening by radiography.   Electronically Signed   By: Monte Fantasia M.D.   On: 09/16/2018 06:47    Lumbar DG 1V:  Results for orders placed during the hospital encounter of 11/13/16  DG Lumbar Spine 1 View   Narrative CLINICAL DATA:  Elective surgery  EXAM: LUMBAR SPINE - 1 VIEW  COMPARISON:  MRI 10/17/2016  FINDINGS: Cross-table lateral view of the cervical spine demonstrates posterior surgical instruments overlying the hardware at the L3-4 level.  IMPRESSION: Intraoperative localization as above.   Electronically Signed   By: Rolm Baptise M.D.   On: 11/13/2016 11:35    Lumbar DG 2-3 views:  Results for orders placed during the hospital encounter of 01/08/12  DG Lumbar Spine 2-3 Views   Narrative  *RADIOLOGY REPORT*  Clinical Data: L3-L4 PLIF.  LUMBAR SPINE - 2-3 VIEW  Comparison: None.  Findings: Three intraoperative fluoroscopic spot films demonstrate posterior rod and pedicle screw fixation at L3-L4.  Soft tissue retractors are present posterior to L3-L4.  IMPRESSION: L3-L4 PLIF.  Original Report Authenticated By: Dereck Ligas, M.D.   Hip Imaging: Hip-L DG 2-3 views:  Results for orders placed during the hospital encounter of 10/20/16  DG HIP UNILAT WITH PELVIS 2-3 VIEWS LEFT   Narrative CLINICAL DATA:  Left hip pain for several weeks  EXAM: DG HIP (WITH OR WITHOUT PELVIS) 2-3V LEFT  COMPARISON:  None.  FINDINGS: Changes of left hip replacement. No hardware or bony complicating feature. No acute bony abnormality. Specifically, no fracture, subluxation, or dislocation. Soft tissues are intact.  IMPRESSION: Left hip  replacement.  No acute bony abnormality.   Electronically Signed   By: Rolm Baptise M.D.   On: 10/20/2016 15:04    Knee Imaging: Knee-R MR wo contrast:  Results for orders placed in visit on 04/22/16  MR Knee Right Wo Contrast   Foot Imaging: Foot-L DG Complete:  Results for orders placed during the hospital encounter of 02/19/16  DG Foot Complete Left   Narrative CLINICAL DATA:  Diffuse left dorsal foot pain and swelling at third and fourth metatarsal-phalangeal joints  EXAM: LEFT FOOT - COMPLETE 3+ VIEW  COMPARISON:  None.  FINDINGS: Plantar and posterior calcaneal spurs. Mild degenerative changes in the midfoot. No acute bony abnormality. Specifically, no fracture, subluxation, or dislocation. Soft tissues are intact.  IMPRESSION: No acute bony abnormality.   Electronically Signed   By: Rolm Baptise M.D.   On: 02/19/2016 14:05    Complexity Note: Imaging results reviewed. Results shared with Ms. Cremeens, using Layman's terms.                         Meds   Current Outpatient Medications:  .  albuterol (PROVENTIL  HFA;VENTOLIN HFA) 108 (90 BASE) MCG/ACT inhaler, Inhale 2 puffs into the lungs every 6 (six) hours as needed for wheezing or shortness of breath. Reported on 04/17/2016, Disp: , Rfl:  .  aspirin 81 MG EC tablet, Take 81 mg by mouth at bedtime. , Disp: , Rfl:  .  celecoxib (CELEBREX) 200 MG capsule, Take 1 capsule (200 mg total) by mouth daily., Disp: 90 capsule, Rfl: 1 .  cyclobenzaprine (FLEXERIL) 10 MG tablet, Take 1 tablet (10 mg total) by mouth 3 (three) times daily as needed for muscle spasms., Disp: 50 tablet, Rfl: 1 .  doxycycline (VIBRA-TABS) 100 MG tablet, Take 1 tablet (100 mg total) by mouth at bedtime. Reported on 12/31/2015, Disp: 90 tablet, Rfl: 1 .  Fluticasone-Salmeterol (ADVAIR) 250-50 MCG/DOSE AEPB, Inhale 1 puff into the lungs 2 (two) times daily., Disp: , Rfl:  .  furosemide (LASIX) 40 MG tablet, Take 1 tablet (40 mg total) by mouth daily as needed., Disp: , Rfl:  .  lamoTRIgine (LAMICTAL) 200 MG tablet, Take 200 mg by mouth daily.  , Disp: , Rfl:  .  levothyroxine (SYNTHROID, LEVOTHROID) 150 MCG tablet, Take 1 tablet (150 mcg total) by mouth at bedtime., Disp: 90 tablet, Rfl: 3 .  liraglutide (VICTOZA) 18 MG/3ML SOPN, Inject 0.3 mLs (1.8 mg total) into the skin at bedtime., Disp: 27 mL, Rfl: 3 .  losartan (COZAAR) 50 MG tablet, Take 1 tablet (50 mg total) by mouth daily., Disp: 90 tablet, Rfl: 3 .  montelukast (SINGULAIR) 10 MG tablet, TAKE 1 TABLET BY MOUTH EVERYDAY AT BEDTIME, Disp: 90 tablet, Rfl: 3 .  Multiple Vitamin (MULTIVITAMIN WITH MINERALS) TABS tablet, Take 2 tablets by mouth daily. , Disp: , Rfl:  .  ondansetron (ZOFRAN) 8 MG tablet, Take 8 mg by mouth every 8 (eight) hours as needed for nausea or vomiting. , Disp: , Rfl:  .  Oxcarbazepine (TRILEPTAL) 300 MG tablet, Take 300-600 mg by mouth 2 (two) times daily. Pt takes one tablet in the morning and two in the evening., Disp: , Rfl:  .  oxyCODONE-acetaminophen (PERCOCET) 5-325 MG tablet, 1-2 tabs po q6 hours prn pain,  Disp: 20 tablet, Rfl: 0 .  pantoprazole (PROTONIX) 40 MG tablet, Take 1 tablet (40 mg total) by mouth daily., Disp: 90 tablet, Rfl: 3 .  promethazine (PHENERGAN)  25 MG tablet, Take 25 mg by mouth every 6 (six) hours as needed for nausea or vomiting., Disp: , Rfl:  .  simvastatin (ZOCOR) 40 MG tablet, TAKE 1 TABLET BY MOUTH EVERY DAY IN THE EVENING, Disp: 90 tablet, Rfl: 3 .  zaleplon (SONATA) 10 MG capsule, Take 10 mg by mouth at bedtime as needed for sleep. Reported on 04/17/2016, Disp: , Rfl:   ROS  Constitutional: Denies any fever or chills Gastrointestinal: No reported hemesis, hematochezia, vomiting, or acute GI distress Musculoskeletal: Denies any acute onset joint swelling, redness, loss of ROM, or weakness Neurological: No reported episodes of acute onset apraxia, aphasia, dysarthria, agnosia, amnesia, paralysis, loss of coordination, or loss of consciousness  Allergies  Ms. Campoverde is allergic to cephalexin; hydrocodone; risperidone and related; seroquel [quetiapine fumarate]; and sulfa antibiotics.  PFSH  Drug: Ms. Rosamond  reports no history of drug use. Alcohol:  reports current alcohol use. Tobacco:  reports that she quit smoking about 9 years ago. Her smoking use included cigarettes. She smoked 1.50 packs per day. She has never used smokeless tobacco. Medical:  has a past medical history of Allergic rhinitis, Anemia, Anxiety, Asthma, Bipolar affective disorder ([place]), CAD (coronary artery disease) (07/2016), Carpal tunnel syndrome, Cataract, Centrilobular emphysema ([place]) (07/2016), Constipation due to pain medication, Depression with anxiety, Diabetes mellitus, GERD (gastroesophageal reflux disease), History of MRSA infection (2015), Hypertension, OSA (obstructive sleep apnea), Osteoarthritis, Osteoarthritis, Pneumonia, Restless legs, Septic arthritis ([place]) (10/11/2012), Shingles (06/30/2016), Shortness of breath, Status post revision of total hip replacement (bilateral), and Thoracic aortic  atherosclerosis ([place]) (11/207). Surgical: Ms. Meador  has a past surgical history that includes Total hip arthroplasty (Left, 2002); Partial hysterectomy (1984); Foot surgery (1982); Neck surgery; TMJ Arthroplasty (1982); Carpal tunnel release (Right, 05/15/2011); Cervical fusion (2012); Knee arthroscopy (09/01/2012); I&D extremity (09/06/2012); Nose surgery; Revision total hip arthroplasty (Left, 08/28/2011); I&D extremity (Left, 07/2013); Colonoscopy with propofol (N/A, 12/31/2015); Eye surgery (Bilateral); Lumbar laminectomy/decompression microdiscectomy (N/A, 11/13/2016); Trigger finger release (Right, 05/15/2011); Lumbar fusion (01/08/2012); and Carpal tunnel release (Left, 12/24/2017). Family: family history includes Alcohol abuse in her father; Aneurysm (age of onset: 18) in her father; CAD in an other family member; Cancer in her brother and father; Diabetes in her brother.  Constitutional Exam  General appearance: Well nourished, well developed, and well hydrated. In no apparent acute distress Vitals:   10/13/18 1037  BP: (!) 164/79  Pulse: 75  Temp: 98.1 F (36.7 C)  SpO2: 97%  Weight: (!) 320 lb (145.2 kg)  Height: '5\' 4"'$  (1.626 m)   BMI Assessment: Estimated body mass index is 54.93 kg/m as calculated from the following:   Height as of this encounter: '5\' 4"'$  (1.626 m).   Weight as of this encounter: 320 lb (145.2 kg).  BMI interpretation table: BMI level Category Range association with higher incidence of chronic pain  <18 kg/m2 Underweight   18.5-24.9 kg/m2 Ideal body weight   25-29.9 kg/m2 Overweight Increased incidence by 20%  30-34.9 kg/m2 Obese (Class I) Increased incidence by 68%  35-39.9 kg/m2 Severe obesity (Class II) Increased incidence by 136%  >40 kg/m2 Extreme obesity (Class III) Increased incidence by 254%   Patient's current BMI Ideal Body weight  Body mass index is 54.93 kg/m. Ideal body weight: 54.7 kg (120 lb 9.5 oz) Adjusted ideal body weight: 90.9 kg (200 lb 5.7  oz)   BMI Readings from Last 4 Encounters:  10/13/18 54.93 kg/m  09/14/18 56.25 kg/m  08/03/18 53.90 kg/m  07/12/18 54.75 kg/m  Wt Readings from Last 4 Encounters:  10/13/18 (!) 320 lb (145.2 kg)  09/14/18 (!) 327 lb 11.2 oz (148.6 kg)  08/03/18 (!) 314 lb (142.4 kg)  07/12/18 (!) 314 lb (142.4 kg)  Psych/Mental status: Alert, oriented x 3 (person, place, & time)       Eyes: PERLA Respiratory: No evidence of acute respiratory distress  Cervical Spine Area Exam  Skin & Axial Inspection: No masses, redness, edema, swelling, or associated skin lesions Alignment: Symmetrical Functional ROM: Unrestricted ROM      Stability: No instability detected Muscle Tone/Strength: Functionally intact. No obvious neuro-muscular anomalies detected. Sensory (Neurological): Unimpaired Palpation: No palpable anomalies              Upper Extremity (UE) Exam    Side: Right upper extremity  Side: Left upper extremity  Skin & Extremity Inspection: Skin color, temperature, and hair growth are WNL. No peripheral edema or cyanosis. No masses, redness, swelling, asymmetry, or associated skin lesions. No contractures.  Skin & Extremity Inspection: Skin color, temperature, and hair growth are WNL. No peripheral edema or cyanosis. No masses, redness, swelling, asymmetry, or associated skin lesions. No contractures.  Functional ROM: Unrestricted ROM          Functional ROM: Unrestricted ROM          Muscle Tone/Strength: Functionally intact. No obvious neuro-muscular anomalies detected.  Muscle Tone/Strength: Functionally intact. No obvious neuro-muscular anomalies detected.  Sensory (Neurological): Unimpaired          Sensory (Neurological): Unimpaired          Palpation: No palpable anomalies              Palpation: No palpable anomalies              Provocative Test(s):  Phalen's test: deferred Tinel's test: deferred Apley's scratch test (touch opposite shoulder):  Action 1 (Across chest):  deferred Action 2 (Overhead): deferred Action 3 (LB reach): deferred   Provocative Test(s):  Phalen's test: deferred Tinel's test: deferred Apley's scratch test (touch opposite shoulder):  Action 1 (Across chest): deferred Action 2 (Overhead): deferred Action 3 (LB reach): deferred    Thoracic Spine Area Exam  Skin & Axial Inspection: No masses, redness, or swelling Alignment: Symmetrical Functional ROM: Unrestricted ROM Stability: No instability detected Muscle Tone/Strength: Functionally intact. No obvious neuro-muscular anomalies detected. Sensory (Neurological): Unimpaired Muscle strength & Tone: No palpable anomalies  Lumbar Spine Area Exam  Skin & Axial Inspection: Well healed scar from previous spine surgery detected Alignment: Symmetrical Functional ROM: Decreased ROM       Stability: No instability detected Muscle Tone/Strength: Increased muscle tone over affected area Sensory (Neurological): Movement-associated pain Palpation: Complains of area being tender to palpation       Provocative Tests: Hyperextension/rotation test: deferred today       Lumbar quadrant test (Kemp's test): deferred today       Lateral bending test: deferred today       Patrick's Maneuver: deferred today                   FABER* test: deferred today                   S-I anterior distraction/compression test: deferred today         S-I lateral compression test: deferred today         S-I Thigh-thrust test: deferred today         S-I Gaenslen's test: deferred today         *(  Flexion, ABduction and External Rotation)  Gait & Posture Assessment  Ambulation: Patient ambulates using a cane Gait: Significantly limited. Dependent on assistive device to ambulate Posture: Difficulty standing up straight, due to pain   Lower Extremity Exam    Side: Right lower extremity  Side: Left lower extremity  Stability: No instability observed          Stability: No instability observed          Skin &  Extremity Inspection: Skin color, temperature, and hair growth are WNL. No peripheral edema or cyanosis. No masses, redness, swelling, asymmetry, or associated skin lesions. No contractures.  Skin & Extremity Inspection: Skin color, temperature, and hair growth are WNL. No peripheral edema or cyanosis. No masses, redness, swelling, asymmetry, or associated skin lesions. No contractures.  Functional ROM: Unrestricted ROM                  Functional ROM: Unrestricted ROM                  Muscle Tone/Strength: Functionally intact. No obvious neuro-muscular anomalies detected.  Muscle Tone/Strength: Functionally intact. No obvious neuro-muscular anomalies detected.  Sensory (Neurological): Dermatomal pain pattern        Sensory (Neurological): Dermatomal pain pattern        DTR: Patellar: deferred today Achilles: deferred today Plantar: deferred today  DTR: Patellar: deferred today Achilles: deferred today Plantar: deferred today  Palpation: No palpable anomalies  Palpation: No palpable anomalies   Assessment & Plan  Primary Diagnosis & Pertinent Problem List: The primary encounter diagnosis was Chronic low back pain (Primary Area of Pain) (Bilateral) (L>R) w/ sciatica (Left). Diagnoses of Osteoarthritis of facet joint of lumbar spine, Lumbar facet arthropathy (Bilateral), Lumbar facet syndrome (Bilateral), History of back surgery, Chronic lower extremity pain (Secondary Area of Pain) (Left), Lumbar foraminal stenosis, Lumbar nerve root compression (L4) (Left), Chronic lumbar radicular pain (L4) (Bilateral), Chronic hip pain (Tertiary Area of Pain) (Bilateral) (L>R), Chronic hip pain after total replacement (Left), History of hip replacement (Left), Chronic knee pain (Fourth Area of Pain) (Bilateral) (R>L), Osteoarthritis involving multiple joints, Abnormal MRI, lumbar spine (09/16/2018), and Chronic pain syndrome were also pertinent to this visit.  Visit Diagnosis: 1. Chronic low back pain (Primary  Area of Pain) (Bilateral) (L>R) w/ sciatica (Left)   2. Osteoarthritis of facet joint of lumbar spine   3. Lumbar facet arthropathy (Bilateral)   4. Lumbar facet syndrome (Bilateral)   5. History of back surgery   6. Chronic lower extremity pain (Secondary Area of Pain) (Left)   7. Lumbar foraminal stenosis   8. Lumbar nerve root compression (L4) (Left)   9. Chronic lumbar radicular pain (L4) (Bilateral)   10. Chronic hip pain (Tertiary Area of Pain) (Bilateral) (L>R)   11. Chronic hip pain after total replacement (Left)   12. History of hip replacement (Left)   13. Chronic knee pain (Fourth Area of Pain) (Bilateral) (R>L)   14. Osteoarthritis involving multiple joints   15. Abnormal MRI, lumbar spine (09/16/2018)   16. Chronic pain syndrome    Problems updated and reviewed during this visit: Problem  History of hip replacement (Left)  History of Back Surgery   L3-4 PLIF with chronic prominent screw loosening by radiography.   Osteoarthritis of Facet Joint of Lumbar Spine   Levels: L2-3: Minor facet spurring L4-5: Progressive facet degeneration with spurring and Anterolisthesis. Marrow edema about the facets. L5-S1: Mild to moderate facet spurring.   Osteoarthritis involving multiple joints  Cervical radiculopathy (Left)   Gone since 2000 after a CESI by Dr. Primus Bravo   Lumbar facet arthropathy (Bilateral)  Lumbar facet syndrome (Bilateral)  Abnormal MRI, lumbar spine (09/16/2018)   LUMBAR MRI FINDINGS: Alignment:  Grade 1 anterolisthesis at L4-5, progressed. Vertebrae: Marrow edema about the bilateral L4-5 facets.  Paraspinal and other soft tissues: Dorsal scarring related to L3-4 fusion.  Disc levels: T11-12: Disc narrowing with posterior ridging and shallow protrusion that deflects the spinal cord without compression, stable. L2-3: Minor facet spurring and disc bulging L3-4: PLIF with chronic pedicle screw loosening by radiography. L4-5: Progressive facet degeneration with  spurring and Anterolisthesis. Marrow edema about the facets. Progressive disc narrowing with a central protrusion extending to the left foramen where there is L4 compression. L5-S1: Mild to moderate facet spurring.  IMPRESSION: 1. L4-5 progressive disc and facet degeneration since 2018 with active facet arthritis on both sides. A new central to left foraminal disc protrusion causes L4 foraminal compression. 2. L3-4 PLIF with chronic prominent screw loosening by radiography.  Electronically Signed   By: Monte Fantasia M.D.   On: 09/16/2018 06:47   Lumbar nerve root compression (L4) (Left)   L4-5 progressive disc and facet degeneration since 2018 with active facet arthritis on both sides. A new central to left foraminal disc protrusion causes L4 foraminal compression.   Lumbar Foraminal Stenosis   L4-5: Progressive facet degeneration with spurring and Anterolisthesis. Marrow edema about the facets. Progressive disc narrowing with a central protrusion extending to the left foramen where there is L4 compression.   Chronic hip pain after total replacement (Left)  Failed Back Surgical Syndrome   L3-4 PLIF with chronic prominent screw loosening by radiography.   Chronic hip pain (Tertiary Area of Pain) (Bilateral) (L>R)   This is likely to be radicular, secondary to her anterolisthesis of L4 over L5, which is likely to be irritating the L4 nerve, bilaterally.   Chronic lumbar radicular pain (L4) (Bilateral)   Lumbar pseudoarthrosis, lumbar disease with spondylolisthesis, lumbar radiculopathy [name])   S/P Lumbar Fusion (L3-4 PLIF)   L3-4 PLIF with chronic prominent screw loosening by radiography.   Cervical Fusion Syndrome   ACDF done about 2000 in [place]. [facility] in [place].   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   Vitamin D Insufficiency  Morbid Obesity With Bmi of  50.0-59.9, Adult ([facility])   Peak 380 lbs (06/2016)     Plan of Care  Pharmacotherapy (Medications Ordered): No orders of the defined types were placed in this encounter.   Procedure Orders     LUMBAR FACET(MEDIAL BRANCH NERVE BLOCK) [facility] Lab Orders  No laboratory test(s) ordered today   Imaging Orders  No imaging studies ordered today   Referral Orders  No referral(s) requested today    Pharmacological management options:  Opioid Analgesics: Options discussed, Ms. Gohr would prefer avoiding them Membrane stabilizer: Options discussed, Ms. Kimble would prefer to avaoid taking any Muscle relaxant: Currently on a muscle relaxant NSAID: Currently on a NSAID Other analgesic(s): Will not be prescribed.   Interventional management options: Planned, scheduled, and/or pending:    None at this time.  The patient indicates that she would prefer to talk to Dr. [name] first.   Considering:   Diagnostic bilateral lumbar facet nerve block  Possible bilateral lumbar facet RFA (Once she brings BMI below 35) Diagnostic bilateral L4 transforaminal LESI  Diagnostic left-sided L3-4 LESI  Diagnostic right-sided intra-articular hip injection  Possible left vs bilateral femoral nerve + obturator nerve block  Possible left vs bilateral femoral nerve + obturator nerve RFA  Diagnostic bilateral intra-articular knee injections (previously done) Diagnostic bilateral Hyalgan series (previously done) Diagnostic bilateral genicular nerve blocks  Possible bilateral genicular nerve RFA    PRN Procedures:   Diagnostic bilateral lumbar facet nerve block #1    Provider-requested follow-up: Return if symptoms worsen or fail to improve, for PRN Procedure: (B) L-FCT BLK #1.  Future Appointments  Date Time Provider [place]  10/19/2018  9:00 AM Joneen Boers R, PT [facility] None  10/22/2018  8:00 AM Shelton Silvas, PT [facility] None  10/26/2018 11:15 AM Cleophus Molt E, PTA [facility] None   10/28/2018 11:00 AM Cleophus Molt E, PTA [facility] None  11/02/2018  9:45 AM Cleophus Molt E, PTA [facility] None  11/04/2018  8:45 AM Cleophus Molt E, PTA [facility] None  11/09/2018 10:45 AM Cleophus Molt E, PTA [facility] None  11/11/2018 11:00 AM Cleophus Molt E, PTA [facility] None  11/16/2018  9:45 AM Cleophus Molt E, PTA [facility] None  11/18/2018  9:30 AM Cleophus Molt E, PTA [facility] None  11/24/2018 11:15 AM Toy Nordmann, PT [facility] None  11/30/2018 11:15 AM Larae Grooms, PTA [facility] None  01/12/2019 10:30 AM Eustace Pen, LPN [facility]  1/85/5015  8:30 AM Ria Bush, MD [facility]    Primary Care Physician: Ria Bush, MD Location: [facility] Note by: Gaspar Cola, MD Date: 10/13/2018; Time: 12:27 PM

## 2018-10-12 ENCOUNTER — Other Ambulatory Visit: Payer: Self-pay

## 2018-10-12 ENCOUNTER — Ambulatory Visit: Payer: Medicare Other | Attending: Nurse Practitioner | Admitting: Physical Therapy

## 2018-10-12 ENCOUNTER — Encounter: Payer: Self-pay | Admitting: Physical Therapy

## 2018-10-12 DIAGNOSIS — M25551 Pain in right hip: Secondary | ICD-10-CM | POA: Diagnosis not present

## 2018-10-12 DIAGNOSIS — M961 Postlaminectomy syndrome, not elsewhere classified: Secondary | ICD-10-CM | POA: Insufficient documentation

## 2018-10-12 DIAGNOSIS — R262 Difficulty in walking, not elsewhere classified: Secondary | ICD-10-CM | POA: Diagnosis not present

## 2018-10-12 DIAGNOSIS — G8929 Other chronic pain: Secondary | ICD-10-CM | POA: Insufficient documentation

## 2018-10-12 DIAGNOSIS — R296 Repeated falls: Secondary | ICD-10-CM | POA: Diagnosis not present

## 2018-10-12 DIAGNOSIS — M25562 Pain in left knee: Secondary | ICD-10-CM | POA: Insufficient documentation

## 2018-10-12 DIAGNOSIS — M6281 Muscle weakness (generalized): Secondary | ICD-10-CM | POA: Insufficient documentation

## 2018-10-12 DIAGNOSIS — M25552 Pain in left hip: Secondary | ICD-10-CM | POA: Diagnosis not present

## 2018-10-12 DIAGNOSIS — M545 Low back pain, unspecified: Secondary | ICD-10-CM

## 2018-10-12 DIAGNOSIS — M25561 Pain in right knee: Secondary | ICD-10-CM | POA: Diagnosis not present

## 2018-10-12 NOTE — Therapy (Signed)
[facility] [address] [place], [address] Phone: [phone]   Fax:  [phone]  Physical Therapy Treatment  Patient Details  Name: Amy Barnes MRN: [id number] Date of Birth: 08-23-1951 Referring Provider (PT): Barbette Merino, NP   Encounter Date: 10/12/2018  PT End of Session - 10/12/18 2005    Visit Number  1    Number of Visits  16    Date for PT Re-Evaluation  12/07/18    Authorization Type  Medicare reporting period from 10/12/2018    Authorization Time Period  Current Cert Period 6/96/2952 - 12/07/2018 (latest PN: IE 10/12/2018)    Authorization - Visit Number  1    Authorization - Number of Visits  10    PT Start Time  1810    PT Stop Time  1910    PT Time Calculation (min)  60 min    Activity Tolerance  Patient tolerated treatment well;Patient limited by pain;Patient limited by fatigue    Behavior During Therapy  [facility] for tasks assessed/performed       Past Medical History:  Diagnosis Date  . Allergic rhinitis   . Anemia   . Anxiety   . Asthma    seasonal  . Bipolar affective disorder (HCC)    takes Synthroid meds for Bipolar  . CAD (coronary artery disease) 07/2016   by CT scan  . Carpal tunnel syndrome   . Cataract   . Centrilobular emphysema (HCC) 07/2016   by CT scan - pt not aware of this  . Constipation due to pain medication   . Depression with anxiety   . Diabetes mellitus   . GERD (gastroesophageal reflux disease)   . 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 Hypertension   . OSA (obstructive sleep apnea)    no longer using cpap, uses a bed that raises and lowers hob  . Osteoarthritis   . Osteoarthritis   . Pneumonia   . Restless legs   . Septic arthritis (HCC) 10/11/2012  . Shingles 06/30/2016  . Shortness of breath   . Status post revision of total hip replacement bilateral   prosthetic infection R 2013, L 2015  . Thoracic aortic  atherosclerosis (HCC) 11/207   by CT    Past Surgical History:  Procedure Laterality Date  . CARPAL TUNNEL RELEASE Right 05/15/2011  . CARPAL TUNNEL RELEASE Left 12/24/2017   Procedure: LEFT CARPAL TUNNEL RELEASE;  Surgeon: [name], MD;  Location: [facility];  Service: Orthopedics;  Laterality: Left;  . CERVICAL FUSION  2012   C2/3/4  . COLONOSCOPY WITH PROPOFOL N/A 12/31/2015   diverticulosis, int hem, o/w normal rpt 10 yrs (Rein)  . EYE SURGERY Bilateral    cataract surgery with lens implant  . FOOT SURGERY  1982   bone spur  . I&D EXTREMITY  09/06/2012   Budd Palmer, MD; Right;  I&D of right thigh  . I&D EXTREMITY Left 07/2013   wound vac - daily doxycycline indefinitely  . KNEE ARTHROSCOPY  09/01/2012   Dannielle Huh, MD;  Right  . LUMBAR FUSION  01/08/2012   L3-4  . LUMBAR LAMINECTOMY/DECOMPRESSION MICRODISCECTOMY N/A 11/13/2016   LAMINOTOMY/LAMINECTOMY LUMBAR FOUR LUMBAR FIVE  WITH RESECTION OF SYNOVIAL CYST;  Surgeon: Tressie Stalker, MD  . NECK SURGERY     Herniated disk C2,3,4  . NOSE SURGERY    . PARTIAL HYSTERECTOMY  Henrietta Hoover, PT, DPT 10/12/2018, 8:24 PM  [facility] [address] [place], [address] Phone: [phone]   Fax:  [phone]  Name: BRIT CARBONELL MRN: [id number] Date of Birth: [birth year]  [facility] [address] [place], [address] Phone: [phone]   Fax:  [phone]  Physical Therapy Treatment  Patient Details  Name: Amy Barnes MRN: [id number] Date of Birth: 08-23-1951 Referring Provider (PT): Barbette Merino, NP   Encounter Date: 10/12/2018  PT End of Session - 10/12/18 2005    Visit Number  1    Number of Visits  16    Date for PT Re-Evaluation  12/07/18    Authorization Type  Medicare reporting period from 10/12/2018    Authorization Time Period  Current Cert Period 6/96/2952 - 12/07/2018 (latest PN: IE 10/12/2018)    Authorization - Visit Number  1    Authorization - Number of Visits  10    PT Start Time  1810    PT Stop Time  1910    PT Time Calculation (min)  60 min    Activity Tolerance  Patient tolerated treatment well;Patient limited by pain;Patient limited by fatigue    Behavior During Therapy  [facility] for tasks assessed/performed       Past Medical History:  Diagnosis Date  . Allergic rhinitis   . Anemia   . Anxiety   . Asthma    seasonal  . Bipolar affective disorder (HCC)    takes Synthroid meds for Bipolar  . CAD (coronary artery disease) 07/2016   by CT scan  . Carpal tunnel syndrome   . Cataract   . Centrilobular emphysema (HCC) 07/2016   by CT scan - pt not aware of this  . Constipation due to pain medication   . Depression with anxiety   . Diabetes mellitus   . GERD (gastroesophageal reflux disease)   . 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 Hypertension   . OSA (obstructive sleep apnea)    no longer using cpap, uses a bed that raises and lowers hob  . Osteoarthritis   . Osteoarthritis   . Pneumonia   . Restless legs   . Septic arthritis (HCC) 10/11/2012  . Shingles 06/30/2016  . Shortness of breath   . Status post revision of total hip replacement bilateral   prosthetic infection R 2013, L 2015  . Thoracic aortic  atherosclerosis (HCC) 11/207   by CT    Past Surgical History:  Procedure Laterality Date  . CARPAL TUNNEL RELEASE Right 05/15/2011  . CARPAL TUNNEL RELEASE Left 12/24/2017   Procedure: LEFT CARPAL TUNNEL RELEASE;  Surgeon: [name], MD;  Location: [facility];  Service: Orthopedics;  Laterality: Left;  . CERVICAL FUSION  2012   C2/3/4  . COLONOSCOPY WITH PROPOFOL N/A 12/31/2015   diverticulosis, int hem, o/w normal rpt 10 yrs (Rein)  . EYE SURGERY Bilateral    cataract surgery with lens implant  . FOOT SURGERY  1982   bone spur  . I&D EXTREMITY  09/06/2012   Budd Palmer, MD; Right;  I&D of right thigh  . I&D EXTREMITY Left 07/2013   wound vac - daily doxycycline indefinitely  . KNEE ARTHROSCOPY  09/01/2012   Dannielle Huh, MD;  Right  . LUMBAR FUSION  01/08/2012   L3-4  . LUMBAR LAMINECTOMY/DECOMPRESSION MICRODISCECTOMY N/A 11/13/2016   LAMINOTOMY/LAMINECTOMY LUMBAR FOUR LUMBAR FIVE  WITH RESECTION OF SYNOVIAL CYST;  Surgeon: Tressie Stalker, MD  . NECK SURGERY     Herniated disk C2,3,4  . NOSE SURGERY    . PARTIAL HYSTERECTOMY  [facility] [address] [place], [address] Phone: [phone]   Fax:  [phone]  Physical Therapy Treatment  Patient Details  Name: Amy Barnes MRN: [id number] Date of Birth: 08-23-1951 Referring Provider (PT): Barbette Merino, NP   Encounter Date: 10/12/2018  PT End of Session - 10/12/18 2005    Visit Number  1    Number of Visits  16    Date for PT Re-Evaluation  12/07/18    Authorization Type  Medicare reporting period from 10/12/2018    Authorization Time Period  Current Cert Period 6/96/2952 - 12/07/2018 (latest PN: IE 10/12/2018)    Authorization - Visit Number  1    Authorization - Number of Visits  10    PT Start Time  1810    PT Stop Time  1910    PT Time Calculation (min)  60 min    Activity Tolerance  Patient tolerated treatment well;Patient limited by pain;Patient limited by fatigue    Behavior During Therapy  [facility] for tasks assessed/performed       Past Medical History:  Diagnosis Date  . Allergic rhinitis   . Anemia   . Anxiety   . Asthma    seasonal  . Bipolar affective disorder (HCC)    takes Synthroid meds for Bipolar  . CAD (coronary artery disease) 07/2016   by CT scan  . Carpal tunnel syndrome   . Cataract   . Centrilobular emphysema (HCC) 07/2016   by CT scan - pt not aware of this  . Constipation due to pain medication   . Depression with anxiety   . Diabetes mellitus   . GERD (gastroesophageal reflux disease)   . 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 Hypertension   . OSA (obstructive sleep apnea)    no longer using cpap, uses a bed that raises and lowers hob  . Osteoarthritis   . Osteoarthritis   . Pneumonia   . Restless legs   . Septic arthritis (HCC) 10/11/2012  . Shingles 06/30/2016  . Shortness of breath   . Status post revision of total hip replacement bilateral   prosthetic infection R 2013, L 2015  . Thoracic aortic  atherosclerosis (HCC) 11/207   by CT    Past Surgical History:  Procedure Laterality Date  . CARPAL TUNNEL RELEASE Right 05/15/2011  . CARPAL TUNNEL RELEASE Left 12/24/2017   Procedure: LEFT CARPAL TUNNEL RELEASE;  Surgeon: [name], MD;  Location: [facility];  Service: Orthopedics;  Laterality: Left;  . CERVICAL FUSION  2012   C2/3/4  . COLONOSCOPY WITH PROPOFOL N/A 12/31/2015   diverticulosis, int hem, o/w normal rpt 10 yrs (Rein)  . EYE SURGERY Bilateral    cataract surgery with lens implant  . FOOT SURGERY  1982   bone spur  . I&D EXTREMITY  09/06/2012   Budd Palmer, MD; Right;  I&D of right thigh  . I&D EXTREMITY Left 07/2013   wound vac - daily doxycycline indefinitely  . KNEE ARTHROSCOPY  09/01/2012   Dannielle Huh, MD;  Right  . LUMBAR FUSION  01/08/2012   L3-4  . LUMBAR LAMINECTOMY/DECOMPRESSION MICRODISCECTOMY N/A 11/13/2016   LAMINOTOMY/LAMINECTOMY LUMBAR FOUR LUMBAR FIVE  WITH RESECTION OF SYNOVIAL CYST;  Surgeon: Tressie Stalker, MD  . NECK SURGERY     Herniated disk C2,3,4  . NOSE SURGERY    . PARTIAL HYSTERECTOMY  [facility] [address] [place], [address] Phone: [phone]   Fax:  [phone]  Physical Therapy Treatment  Patient Details  Name: Amy Barnes MRN: [id number] Date of Birth: 08-23-1951 Referring Provider (PT): Barbette Merino, NP   Encounter Date: 10/12/2018  PT End of Session - 10/12/18 2005    Visit Number  1    Number of Visits  16    Date for PT Re-Evaluation  12/07/18    Authorization Type  Medicare reporting period from 10/12/2018    Authorization Time Period  Current Cert Period 6/96/2952 - 12/07/2018 (latest PN: IE 10/12/2018)    Authorization - Visit Number  1    Authorization - Number of Visits  10    PT Start Time  1810    PT Stop Time  1910    PT Time Calculation (min)  60 min    Activity Tolerance  Patient tolerated treatment well;Patient limited by pain;Patient limited by fatigue    Behavior During Therapy  [facility] for tasks assessed/performed       Past Medical History:  Diagnosis Date  . Allergic rhinitis   . Anemia   . Anxiety   . Asthma    seasonal  . Bipolar affective disorder (HCC)    takes Synthroid meds for Bipolar  . CAD (coronary artery disease) 07/2016   by CT scan  . Carpal tunnel syndrome   . Cataract   . Centrilobular emphysema (HCC) 07/2016   by CT scan - pt not aware of this  . Constipation due to pain medication   . Depression with anxiety   . Diabetes mellitus   . GERD (gastroesophageal reflux disease)   . 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 Hypertension   . OSA (obstructive sleep apnea)    no longer using cpap, uses a bed that raises and lowers hob  . Osteoarthritis   . Osteoarthritis   . Pneumonia   . Restless legs   . Septic arthritis (HCC) 10/11/2012  . Shingles 06/30/2016  . Shortness of breath   . Status post revision of total hip replacement bilateral   prosthetic infection R 2013, L 2015  . Thoracic aortic  atherosclerosis (HCC) 11/207   by CT    Past Surgical History:  Procedure Laterality Date  . CARPAL TUNNEL RELEASE Right 05/15/2011  . CARPAL TUNNEL RELEASE Left 12/24/2017   Procedure: LEFT CARPAL TUNNEL RELEASE;  Surgeon: [name], MD;  Location: [facility];  Service: Orthopedics;  Laterality: Left;  . CERVICAL FUSION  2012   C2/3/4  . COLONOSCOPY WITH PROPOFOL N/A 12/31/2015   diverticulosis, int hem, o/w normal rpt 10 yrs (Rein)  . EYE SURGERY Bilateral    cataract surgery with lens implant  . FOOT SURGERY  1982   bone spur  . I&D EXTREMITY  09/06/2012   Budd Palmer, MD; Right;  I&D of right thigh  . I&D EXTREMITY Left 07/2013   wound vac - daily doxycycline indefinitely  . KNEE ARTHROSCOPY  09/01/2012   Dannielle Huh, MD;  Right  . LUMBAR FUSION  01/08/2012   L3-4  . LUMBAR LAMINECTOMY/DECOMPRESSION MICRODISCECTOMY N/A 11/13/2016   LAMINOTOMY/LAMINECTOMY LUMBAR FOUR LUMBAR FIVE  WITH RESECTION OF SYNOVIAL CYST;  Surgeon: Tressie Stalker, MD  . NECK SURGERY     Herniated disk C2,3,4  . NOSE SURGERY    . PARTIAL HYSTERECTOMY  Henrietta Hoover, PT, DPT 10/12/2018, 8:24 PM  [facility] [address] [place], [address] Phone: [phone]   Fax:  [phone]  Name: BRIT CARBONELL MRN: [id number] Date of Birth: [birth year]  [facility] [address] [place], [address] Phone: [phone]   Fax:  [phone]  Physical Therapy Treatment  Patient Details  Name: Amy Barnes MRN: [id number] Date of Birth: 08-23-1951 Referring Provider (PT): Barbette Merino, NP   Encounter Date: 10/12/2018  PT End of Session - 10/12/18 2005    Visit Number  1    Number of Visits  16    Date for PT Re-Evaluation  12/07/18    Authorization Type  Medicare reporting period from 10/12/2018    Authorization Time Period  Current Cert Period 6/96/2952 - 12/07/2018 (latest PN: IE 10/12/2018)    Authorization - Visit Number  1    Authorization - Number of Visits  10    PT Start Time  1810    PT Stop Time  1910    PT Time Calculation (min)  60 min    Activity Tolerance  Patient tolerated treatment well;Patient limited by pain;Patient limited by fatigue    Behavior During Therapy  [facility] for tasks assessed/performed       Past Medical History:  Diagnosis Date  . Allergic rhinitis   . Anemia   . Anxiety   . Asthma    seasonal  . Bipolar affective disorder (HCC)    takes Synthroid meds for Bipolar  . CAD (coronary artery disease) 07/2016   by CT scan  . Carpal tunnel syndrome   . Cataract   . Centrilobular emphysema (HCC) 07/2016   by CT scan - pt not aware of this  . Constipation due to pain medication   . Depression with anxiety   . Diabetes mellitus   . GERD (gastroesophageal reflux disease)   . 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 Hypertension   . OSA (obstructive sleep apnea)    no longer using cpap, uses a bed that raises and lowers hob  . Osteoarthritis   . Osteoarthritis   . Pneumonia   . Restless legs   . Septic arthritis (HCC) 10/11/2012  . Shingles 06/30/2016  . Shortness of breath   . Status post revision of total hip replacement bilateral   prosthetic infection R 2013, L 2015  . Thoracic aortic  atherosclerosis (HCC) 11/207   by CT    Past Surgical History:  Procedure Laterality Date  . CARPAL TUNNEL RELEASE Right 05/15/2011  . CARPAL TUNNEL RELEASE Left 12/24/2017   Procedure: LEFT CARPAL TUNNEL RELEASE;  Surgeon: [name], MD;  Location: [facility];  Service: Orthopedics;  Laterality: Left;  . CERVICAL FUSION  2012   C2/3/4  . COLONOSCOPY WITH PROPOFOL N/A 12/31/2015   diverticulosis, int hem, o/w normal rpt 10 yrs (Rein)  . EYE SURGERY Bilateral    cataract surgery with lens implant  . FOOT SURGERY  1982   bone spur  . I&D EXTREMITY  09/06/2012   Budd Palmer, MD; Right;  I&D of right thigh  . I&D EXTREMITY Left 07/2013   wound vac - daily doxycycline indefinitely  . KNEE ARTHROSCOPY  09/01/2012   Dannielle Huh, MD;  Right  . LUMBAR FUSION  01/08/2012   L3-4  . LUMBAR LAMINECTOMY/DECOMPRESSION MICRODISCECTOMY N/A 11/13/2016   LAMINOTOMY/LAMINECTOMY LUMBAR FOUR LUMBAR FIVE  WITH RESECTION OF SYNOVIAL CYST;  Surgeon: Tressie Stalker, MD  . NECK SURGERY     Herniated disk C2,3,4  . NOSE SURGERY    . PARTIAL HYSTERECTOMY  Henrietta Hoover, PT, DPT 10/12/2018, 8:24 PM  [facility] [address] [place], [address] Phone: [phone]   Fax:  [phone]  Name: BRIT CARBONELL MRN: [id number] Date of Birth: [birth year]  [facility] [address] [place], [address] Phone: [phone]   Fax:  [phone]  Physical Therapy Treatment  Patient Details  Name: Amy Barnes MRN: [id number] Date of Birth: 08-23-1951 Referring Provider (PT): Barbette Merino, NP   Encounter Date: 10/12/2018  PT End of Session - 10/12/18 2005    Visit Number  1    Number of Visits  16    Date for PT Re-Evaluation  12/07/18    Authorization Type  Medicare reporting period from 10/12/2018    Authorization Time Period  Current Cert Period 6/96/2952 - 12/07/2018 (latest PN: IE 10/12/2018)    Authorization - Visit Number  1    Authorization - Number of Visits  10    PT Start Time  1810    PT Stop Time  1910    PT Time Calculation (min)  60 min    Activity Tolerance  Patient tolerated treatment well;Patient limited by pain;Patient limited by fatigue    Behavior During Therapy  [facility] for tasks assessed/performed       Past Medical History:  Diagnosis Date  . Allergic rhinitis   . Anemia   . Anxiety   . Asthma    seasonal  . Bipolar affective disorder (HCC)    takes Synthroid meds for Bipolar  . CAD (coronary artery disease) 07/2016   by CT scan  . Carpal tunnel syndrome   . Cataract   . Centrilobular emphysema (HCC) 07/2016   by CT scan - pt not aware of this  . Constipation due to pain medication   . Depression with anxiety   . Diabetes mellitus   . GERD (gastroesophageal reflux disease)   . 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 Hypertension   . OSA (obstructive sleep apnea)    no longer using cpap, uses a bed that raises and lowers hob  . Osteoarthritis   . Osteoarthritis   . Pneumonia   . Restless legs   . Septic arthritis (HCC) 10/11/2012  . Shingles 06/30/2016  . Shortness of breath   . Status post revision of total hip replacement bilateral   prosthetic infection R 2013, L 2015  . Thoracic aortic  atherosclerosis (HCC) 11/207   by CT    Past Surgical History:  Procedure Laterality Date  . CARPAL TUNNEL RELEASE Right 05/15/2011  . CARPAL TUNNEL RELEASE Left 12/24/2017   Procedure: LEFT CARPAL TUNNEL RELEASE;  Surgeon: [name], MD;  Location: [facility];  Service: Orthopedics;  Laterality: Left;  . CERVICAL FUSION  2012   C2/3/4  . COLONOSCOPY WITH PROPOFOL N/A 12/31/2015   diverticulosis, int hem, o/w normal rpt 10 yrs (Rein)  . EYE SURGERY Bilateral    cataract surgery with lens implant  . FOOT SURGERY  1982   bone spur  . I&D EXTREMITY  09/06/2012   Budd Palmer, MD; Right;  I&D of right thigh  . I&D EXTREMITY Left 07/2013   wound vac - daily doxycycline indefinitely  . KNEE ARTHROSCOPY  09/01/2012   Dannielle Huh, MD;  Right  . LUMBAR FUSION  01/08/2012   L3-4  . LUMBAR LAMINECTOMY/DECOMPRESSION MICRODISCECTOMY N/A 11/13/2016   LAMINOTOMY/LAMINECTOMY LUMBAR FOUR LUMBAR FIVE  WITH RESECTION OF SYNOVIAL CYST;  Surgeon: Tressie Stalker, MD  . NECK SURGERY     Herniated disk C2,3,4  . NOSE SURGERY    . PARTIAL HYSTERECTOMY

## 2018-10-13 ENCOUNTER — Encounter: Payer: Self-pay | Admitting: Pain Medicine

## 2018-10-13 ENCOUNTER — Other Ambulatory Visit: Payer: Self-pay

## 2018-10-13 ENCOUNTER — Ambulatory Visit: Payer: Medicare Other | Attending: Pain Medicine | Admitting: Pain Medicine

## 2018-10-13 VITALS — BP 164/79 | HR 75 | Temp 98.1°F | Ht 64.0 in | Wt 320.0 lb

## 2018-10-13 DIAGNOSIS — M79605 Pain in left leg: Secondary | ICD-10-CM | POA: Diagnosis not present

## 2018-10-13 DIAGNOSIS — Z96642 Presence of left artificial hip joint: Secondary | ICD-10-CM | POA: Diagnosis not present

## 2018-10-13 DIAGNOSIS — M5442 Lumbago with sciatica, left side: Secondary | ICD-10-CM | POA: Diagnosis not present

## 2018-10-13 DIAGNOSIS — G894 Chronic pain syndrome: Secondary | ICD-10-CM | POA: Diagnosis not present

## 2018-10-13 DIAGNOSIS — G8929 Other chronic pain: Secondary | ICD-10-CM | POA: Diagnosis not present

## 2018-10-13 DIAGNOSIS — R937 Abnormal findings on diagnostic imaging of other parts of musculoskeletal system: Secondary | ICD-10-CM

## 2018-10-13 DIAGNOSIS — M25561 Pain in right knee: Secondary | ICD-10-CM | POA: Diagnosis not present

## 2018-10-13 DIAGNOSIS — M5416 Radiculopathy, lumbar region: Secondary | ICD-10-CM

## 2018-10-13 DIAGNOSIS — E559 Vitamin D deficiency, unspecified: Secondary | ICD-10-CM | POA: Insufficient documentation

## 2018-10-13 DIAGNOSIS — Z9889 Other specified postprocedural states: Secondary | ICD-10-CM | POA: Insufficient documentation

## 2018-10-13 DIAGNOSIS — M25551 Pain in right hip: Secondary | ICD-10-CM

## 2018-10-13 DIAGNOSIS — M25552 Pain in left hip: Secondary | ICD-10-CM | POA: Diagnosis not present

## 2018-10-13 DIAGNOSIS — M25562 Pain in left knee: Secondary | ICD-10-CM | POA: Diagnosis not present

## 2018-10-13 DIAGNOSIS — M150 Primary generalized (osteo)arthritis: Secondary | ICD-10-CM

## 2018-10-13 DIAGNOSIS — M48061 Spinal stenosis, lumbar region without neurogenic claudication: Secondary | ICD-10-CM | POA: Diagnosis not present

## 2018-10-13 DIAGNOSIS — M159 Polyosteoarthritis, unspecified: Secondary | ICD-10-CM | POA: Insufficient documentation

## 2018-10-13 DIAGNOSIS — M47816 Spondylosis without myelopathy or radiculopathy, lumbar region: Secondary | ICD-10-CM

## 2018-10-13 DIAGNOSIS — M5412 Radiculopathy, cervical region: Secondary | ICD-10-CM | POA: Insufficient documentation

## 2018-10-13 NOTE — Patient Instructions (Addendum)
____________________________________________________________________________________________  Preparing for Procedure with Sedation  Instructions: . Oral Intake: Do not eat or drink anything for at least 8 hours prior to your procedure. . Transportation: Public transportation is not allowed. Bring an adult driver. The driver must be physically present in our waiting room before any procedure can be started. . Physical Assistance: Bring an adult physically capable of assisting you, in the event you need help. This adult should keep you company at home for at least 6 hours after the procedure. . Blood Pressure Medicine: Take your blood pressure medicine with a sip of water the morning of the procedure. . Blood thinners: Notify our staff if you are taking any blood thinners. Depending on which one you take, there will be specific instructions on how and when to stop it. . Diabetics on insulin: Notify the staff so that you can be scheduled 1st case in the morning. If your diabetes requires high dose insulin, take only  of your normal insulin dose the morning of the procedure and notify the staff that you have done so. . Preventing infections: Shower with an antibacterial soap the morning of your procedure. . Build-up your immune system: Take 1000 mg of Vitamin C with every meal (3 times a day) the day prior to your procedure. . Antibiotics: Inform the staff if you have a condition or reason that requires you to take antibiotics before dental procedures. . Pregnancy: If you are pregnant, call and cancel the procedure. . Sickness: If you have a cold, fever, or any active infections, call and cancel the procedure. . Arrival: You must be in the facility at least 30 minutes prior to your scheduled procedure. . Children: Do not bring children with you. . Dress appropriately: Bring dark clothing that you would not mind if they get stained. . Valuables: Do not bring any jewelry or valuables.  Procedure  appointments are reserved for interventional treatments only. . No Prescription Refills. . No medication changes will be discussed during procedure appointments. . No disability issues will be discussed.  Reasons to call and reschedule or cancel your procedure: (Following these recommendations will minimize the risk of a serious complication.) . Surgeries: Avoid having procedures within 2 weeks of any surgery. (Avoid for 2 weeks before or after any surgery). . Flu Shots: Avoid having procedures within 2 weeks of a flu shots or . (Avoid for 2 weeks before or after immunizations). . Barium: Avoid having a procedure within 7-10 days after having had a radiological study involving the use of radiological contrast. (Myelograms, Barium swallow or enema study). . Heart attacks: Avoid any elective procedures or surgeries for the initial 6 months after a "Myocardial Infarction" (Heart Attack). . Blood thinners: It is imperative that you stop these medications before procedures. Let us know if you if you take any blood thinner.  . Infection: Avoid procedures during or within two weeks of an infection (including chest colds or gastrointestinal problems). Symptoms associated with infections include: Localized redness, fever, chills, night sweats or profuse sweating, burning sensation when voiding, cough, congestion, stuffiness, runny nose, sore throat, diarrhea, nausea, vomiting, cold or Flu symptoms, recent or current infections. It is specially important if the infection is over the area that we intend to treat. . Heart and lung problems: Symptoms that may suggest an active cardiopulmonary problem include: cough, chest pain, breathing difficulties or shortness of breath, dizziness, ankle swelling, uncontrolled high or unusually low blood pressure, and/or palpitations. If you are experiencing any of these symptoms, cancel   your procedure and contact your primary care physician for an evaluation.  Remember:   Regular Business hours are:  Monday to Thursday 8:00 AM to 4:00 PM  Provider's Schedule: Sukhman Martine, MD:  Procedure days: Tuesday and Thursday 7:30 AM to 4:00 PM  Bilal Lateef, MD:  Procedure days: Monday and Wednesday 7:30 AM to 4:00 PM ____________________________________________________________________________________________   ____________________________________________________________________________________________  General Risks and Possible Complications  Patient Responsibilities: It is important that you read this as it is part of your informed consent. It is our duty to inform you of the risks and possible complications associated with treatments offered to you. It is your responsibility as a patient to read this and to ask questions about anything that is not clear or that you believe was not covered in this document.  Patient's Rights: You have the right to refuse treatment. You also have the right to change your mind, even after initially having agreed to have the treatment done. However, under this last option, if you wait until the last second to change your mind, you may be charged for the materials used up to that point.  Introduction: Medicine is not an exact science. Everything in Medicine, including the lack of treatment(s), carries the potential for danger, harm, or loss (which is by definition: Risk). In Medicine, a complication is a secondary problem, condition, or disease that can aggravate an already existing one. All treatments carry the risk of possible complications. The fact that a side effects or complications occurs, does not imply that the treatment was conducted incorrectly. It must be clearly understood that these can happen even when everything is done following the highest safety standards.  No treatment: You can choose not to proceed with the proposed treatment alternative. The "PRO(s)" would include: avoiding the risk of complications associated  with the therapy. The "CON(s)" would include: not getting any of the treatment benefits. These benefits fall under one of three categories: diagnostic; therapeutic; and/or palliative. Diagnostic benefits include: getting information which can ultimately lead to improvement of the disease or symptom(s). Therapeutic benefits are those associated with the successful treatment of the disease. Finally, palliative benefits are those related to the decrease of the primary symptoms, without necessarily curing the condition (example: decreasing the pain from a flare-up of a chronic condition, such as incurable terminal cancer).  General Risks and Complications: These are associated to most interventional treatments. They can occur alone, or in combination. They fall under one of the following six (6) categories: no benefit or worsening of symptoms; bleeding; infection; nerve damage; allergic reactions; and/or death. 1. No benefits or worsening of symptoms: In Medicine there are no guarantees, only probabilities. No healthcare provider can ever guarantee that a medical treatment will work, they can only state the probability that it may. Furthermore, there is always the possibility that the condition may worsen, either directly, or indirectly, as a consequence of the treatment. 2. Bleeding: This is more common if the patient is taking a blood thinner, either prescription or over the counter (example: Goody Powders, Fish oil, Aspirin, Garlic, etc.), or if suffering a condition associated with impaired coagulation (example: Hemophilia, cirrhosis of the liver, low platelet counts, etc.). However, even if you do not have one on these, it can still happen. If you have any of these conditions, or take one of these drugs, make sure to notify your treating physician. 3. Infection: This is more common in patients with a compromised immune system, either due to disease (example: diabetes, cancer,   human immunodeficiency virus  [HIV], etc.), or due to medications or treatments (example: therapies used to treat cancer and rheumatological diseases). However, even if you do not have one on these, it can still happen. If you have any of these conditions, or take one of these drugs, make sure to notify your treating physician. 4. Nerve Damage: This is more common when the treatment is an invasive one, but it can also happen with the use of medications, such as those used in the treatment of cancer. The damage can occur to small secondary nerves, or to large primary ones, such as those in the spinal cord and brain. This damage may be temporary or permanent and it may lead to impairments that can range from temporary numbness to permanent paralysis and/or brain death. 5. Allergic Reactions: Any time a substance or material comes in contact with our body, there is the possibility of an allergic reaction. These can range from a mild skin rash (contact dermatitis) to a severe systemic reaction (anaphylactic reaction), which can result in death. 6. Death: In general, any medical intervention can result in death, most of the time due to an unforeseen complication. ____________________________________________________________________________________________    ____________________________________________________________________________________________  Weight Management Required  URGENT: Your weight has been found to be adversely affecting your health.  Dear Ms. Kroeze:  Your current Body mass index is 54.93 kg/m.Marland Kitchen Estimated body mass index is 54.93 kg/m as calculated from the following:   Height as of this encounter: 5\' 4"  (1.626 m).   Weight as of this encounter: 320 lb (145.2 kg).  Your last four (4) weight and BMI calculations are as follows: Wt Readings from Last 4 Encounters:  10/13/18 (!) 320 lb (145.2 kg)  09/14/18 (!) 327 lb 11.2 oz (148.6 kg)  08/03/18 (!) 314 lb (142.4 kg)  07/12/18 (!) 314 lb (142.4 kg)   BMI  Readings from Last 4 Encounters:  10/13/18 54.93 kg/m  09/14/18 56.25 kg/m  08/03/18 53.90 kg/m  07/12/18 54.75 kg/m    Calculations estimate your ideal body weight to be: Ideal body weight: 54.7 kg (120 lb 9.5 oz) Adjusted ideal body weight: 90.9 kg (200 lb 5.7 oz)  Please use the table below to identify your weight category and associated incidence of chronic pain, secondary to your weight.  BMI interpretation table: BMI level Category Associated incidence of chronic pain  <18 kg/m2 Underweight   18.5-24.9 kg/m2 Ideal body weight   25-29.9 kg/m2 Overweight  20%  30-34.9 kg/m2 Obese (Class I)  68%  35-39.9 kg/m2 Severe obesity (Class II)  136%  >40 kg/m2 Extreme obesity (Class III)  254%   In addition: You will be considered "Morbidly Obese", if your BMI is above 30 and you have one or more of the following conditions that are directly associated with obesity: 1. Type 2 Diabetes (Which in turn can lead to cardiovascular diseases (CVD), stroke, peripheral vascular diseases (PVD), retinopathy, nephropathy, and neuropathy) 2. Cardiovascular Disease  3. Breathing problems (Asthma, obesity-hypoventilation syndrome, obstructive sleep apnea, chronic inflammatory airway disease) 4. Chronic kidney disease 5. Liver disease (nonalcoholic fatty liver disease) 6. High blood pressure 7. Acid reflux (gastroesophageal reflux disease) 8. Osteoarthritis (OA) 9. Low back pain (Lumbar Facet Syndrome) 10. Hip pain (Osteoarthritis of hip) 11. Knee pain (Osteoarthritis of knee) (patients with a BMI>30 kg/m2 were 6.8 times more likely to develop knee OA than normal-weight individuals) 12. Certain types of cancer. (Epidemiological studies have shown that obesity is a risk factor for: post-menopausal breast cancer; cancers  of the endometrium, colon and kidney; malignant adenomas of the oesophagus. Obese subjects have an approximately 1.5-3.5-fold increased risk of developing these cancers compared with  normal-weight subjects, and it has been estimated that between 15 and 45% of these cancers can be attributed to overweight. More recent studies suggest that obesity may also increase the risk of other types of cancer, including pancreatic, hepatic and gallbladder cancer. Ref: Obesity and cancer. Pischon T, Nthlings U, [employer] H. Proc Nutr Soc. 2008 May;67(2):128-45. doi: 10.1017/S0029665108006976.)  Recommendation: At this point it is urgent that you take a step back and concentrate in loosing weight. Because most chronic pain patients do have difficulty exercising secondary to their pain, you must rely on proper nutrition and dieting in order to lose the weight. If your BMI is above 40, you should seriously consider bariatric surgery. A realistic goal is to lose 10% of your body weight over a period of 12 months.  If over time you have unsuccessfully try to lose weight, then it is time for you to seek professional help and to enter a medically supervised weight management program.  Pain management considerations:  1. Pharmacological Problems: Be advised that the use of opioid analgesics has been associated with decreased metabolism and weight gain.  For this reason, should we see that you are unable to lose weight while taking these medications, it may become necessary for [place] to taper down and indefinitely discontinue these medicines.  2. Technical Problems: The incidence of successful interventional therapies decreases as the patient's BMI increases. It is much more difficult to accomplish a safe and effective interventional therapy on a patient with a BMI above 35. Yours is Body mass index is 54.93 kg/m.Marland Kitchen 3. Radiation Exposure Problems: The x-rays machine, used to accomplish injection therapies, will automatically increase their x-ray output in order to capture an appropriate bone image. This means that radiation exposure increases exponentially with the patient's BMI. (The higher the BMI, the higher the  radiation exposure.) Although the level of radiation used at a given time is still safe to the patient, it is not for the physician and/or assisting staff. Unfortunately, radiation exposure is accumulative. Because physicians and the staff have to do procedures and be exposed on a daily basis, this can result in health problems such as cancer and radiation burns. Radiation exposure to the staff is monitored by the radiation batches that they wear. The exposure levels are reported back to the staff on a quarterly basis. Depending on levels of exposure, physicians and staff may be obligated by law to decrease this exposure. This means that they have the right and obligation to refuse providing therapies where they may be overexposed to radiation. For this reason, physicians may decline to offer therapies such as radiofrequency ablation or implants to patients with a BMI above 40. ____________________________________________________________________________________________

## 2018-10-18 ENCOUNTER — Encounter: Payer: Medicare Other | Admitting: Physical Therapy

## 2018-10-19 ENCOUNTER — Ambulatory Visit: Payer: Medicare Other | Admitting: Physical Therapy

## 2018-10-19 ENCOUNTER — Ambulatory Visit: Payer: Medicare Other

## 2018-10-20 ENCOUNTER — Ambulatory Visit: Payer: Medicare Other | Admitting: Physical Therapy

## 2018-10-22 ENCOUNTER — Ambulatory Visit: Payer: Medicare Other | Admitting: Physical Therapy

## 2018-10-25 DIAGNOSIS — Z6841 Body Mass Index (BMI) 40.0 and over, adult: Secondary | ICD-10-CM | POA: Diagnosis not present

## 2018-10-25 DIAGNOSIS — R030 Elevated blood-pressure reading, without diagnosis of hypertension: Secondary | ICD-10-CM | POA: Diagnosis not present

## 2018-10-25 DIAGNOSIS — M5136 Other intervertebral disc degeneration, lumbar region: Secondary | ICD-10-CM | POA: Diagnosis not present

## 2018-10-25 DIAGNOSIS — S32009K Unspecified fracture of unspecified lumbar vertebra, subsequent encounter for fracture with nonunion: Secondary | ICD-10-CM | POA: Diagnosis not present

## 2018-10-26 ENCOUNTER — Ambulatory Visit: Payer: Medicare Other

## 2018-10-28 ENCOUNTER — Ambulatory Visit: Payer: Medicare Other

## 2018-11-02 ENCOUNTER — Encounter: Payer: Medicare Other | Admitting: Physical Therapy

## 2018-11-02 ENCOUNTER — Ambulatory Visit: Payer: Medicare Other | Admitting: Pain Medicine

## 2018-11-02 ENCOUNTER — Ambulatory Visit: Payer: Medicare Other

## 2018-11-04 ENCOUNTER — Ambulatory Visit: Payer: Medicare Other

## 2018-11-04 ENCOUNTER — Other Ambulatory Visit: Payer: Self-pay | Admitting: Family Medicine

## 2018-11-04 NOTE — Telephone Encounter (Signed)
Will you address in Dr. Timoteo Expose absence?  Message from [facility] stating losartan 50 mg tab is on backorder. They are suggesting rx for 100 mg, 1/2 tab daily.  Plz advise.  Last OV:  07/12/18, f/u Next OV:  01/25/19, CPE

## 2018-11-05 NOTE — Telephone Encounter (Signed)
Sent. Thanks.   

## 2018-11-09 ENCOUNTER — Ambulatory Visit
Admission: RE | Admit: 2018-11-09 | Discharge: 2018-11-09 | Disposition: A | Discharge status: Home / Self Care | Payer: Medicare Other | Source: Ambulatory Visit | Attending: Pain Medicine | Admitting: Pain Medicine

## 2018-11-09 ENCOUNTER — Encounter: Payer: Self-pay | Admitting: Pain Medicine

## 2018-11-09 ENCOUNTER — Encounter: Payer: Medicare Other | Admitting: Physical Therapy

## 2018-11-09 ENCOUNTER — Ambulatory Visit (HOSPITAL_BASED_OUTPATIENT_CLINIC_OR_DEPARTMENT_OTHER): Payer: Medicare Other | Admitting: Pain Medicine

## 2018-11-09 ENCOUNTER — Other Ambulatory Visit: Payer: Self-pay

## 2018-11-09 ENCOUNTER — Ambulatory Visit: Payer: Medicare Other

## 2018-11-09 VITALS — BP 156/82 | HR 84 | Temp 97.3°F | Resp 13 | Ht 64.0 in | Wt 320.0 lb

## 2018-11-09 DIAGNOSIS — G8929 Other chronic pain: Secondary | ICD-10-CM | POA: Insufficient documentation

## 2018-11-09 DIAGNOSIS — Z6841 Body Mass Index (BMI) 40.0 and over, adult: Secondary | ICD-10-CM | POA: Insufficient documentation

## 2018-11-09 DIAGNOSIS — M545 Low back pain, unspecified: Secondary | ICD-10-CM

## 2018-11-09 DIAGNOSIS — M47816 Spondylosis without myelopathy or radiculopathy, lumbar region: Secondary | ICD-10-CM | POA: Insufficient documentation

## 2018-11-09 MED ORDER — TRIAMCINOLONE ACETONIDE 40 MG/ML IJ SUSP
80.0000 mg | Freq: Once | INTRAMUSCULAR | Status: AC
  Administered 2018-11-09: 80 mg
  Filled 2018-11-09: qty 2

## 2018-11-09 MED ORDER — LIDOCAINE HCL 2 % IJ SOLN
20.0000 mL | Freq: Once | INTRAMUSCULAR | Status: AC
  Administered 2018-11-09: 400 mg
  Filled 2018-11-09: qty 40

## 2018-11-09 MED ORDER — ROPIVACAINE HCL 2 MG/ML IJ SOLN
INTRAMUSCULAR | Status: AC
  Filled 2018-11-09: qty 10

## 2018-11-09 MED ORDER — MIDAZOLAM HCL 5 MG/5ML IJ SOLN
1.0000 mg | INTRAMUSCULAR | Status: DC | PRN
  Administered 2018-11-09: 4 mg via INTRAVENOUS
  Filled 2018-11-09: qty 5

## 2018-11-09 MED ORDER — ROPIVACAINE HCL 2 MG/ML IJ SOLN
18.0000 mL | Freq: Once | INTRAMUSCULAR | Status: AC
  Administered 2018-11-09: 18 mL via PERINEURAL
  Filled 2018-11-09: qty 20

## 2018-11-09 MED ORDER — LACTATED RINGERS IV SOLN
1000.0000 mL | Freq: Once | INTRAVENOUS | Status: AC
  Administered 2018-11-09: 1000 mL via INTRAVENOUS

## 2018-11-09 MED ORDER — FENTANYL CITRATE (PF) 100 MCG/2ML IJ SOLN
25.0000 ug | INTRAMUSCULAR | Status: DC | PRN
  Administered 2018-11-09: 100 ug via INTRAVENOUS
  Filled 2018-11-09: qty 2

## 2018-11-09 NOTE — Patient Instructions (Signed)

## 2018-11-09 NOTE — Progress Notes (Signed)
Safety precautions to be maintained throughout the outpatient stay will include: orient to surroundings, keep bed in low position, maintain call bell within reach at all times, provide assistance with transfer out of bed and ambulation.  

## 2018-11-09 NOTE — Progress Notes (Signed)
Patient's Name: Amy Barnes  MRN: [id number]  Referring Provider: [name], MD  DOB: Dec 07, 1950  PCP: [name], MD  DOS: 11/09/2018  Note by: Oswaldo Done, MD  Service setting: Ambulatory outpatient  Specialty: Interventional Pain Management  Patient type: Established  Location: [facility] (AMB) Pain Management Facility  Visit type: Interventional Procedure   Primary Reason for Visit: Interventional Pain Management Treatment. CC: Back Pain (lower)  Procedure:          Anesthesia, Analgesia, Anxiolysis:  Type: Lumbar Facet, Medial Branch Block(s) #1  Primary Purpose: Diagnostic Region: Posterolateral Lumbosacral Spine Level: L2, L3, L4, L5, & S1 Medial Branch Level(s). Injecting these levels blocks the L3-4, L4-5, and L5-S1 lumbar facet joints. Laterality: Bilateral  Type: Moderate (Conscious) Sedation combined with Local Anesthesia Indication(s): Analgesia and Anxiety Route: Intravenous (IV) IV Access: Secured Sedation: Meaningful verbal contact was maintained at all times during the procedure  Local Anesthetic: Lidocaine 1-2%  Position: Prone   Indications: 1. Spondylosis without myelopathy or radiculopathy, lumbar region   2. Lumbar facet syndrome (Bilateral) (L>R)   3. Lumbar facet arthropathy (Bilateral)   4. Osteoarthritis of facet joint of lumbar spine   5. Chronic low back pain (Bilateral) (L>R) w/o sciatica   6. Morbid obesity with BMI of 50.0-59.9, adult (HCC)    Pain Score: Pre-procedure: 3 /10 Post-procedure: 0-No pain/10  Pre-op Assessment:  Amy Barnes is a 68 y.o. (year old), female patient, seen today for interventional treatment. She  has a past surgical history that includes Total hip arthroplasty (Left, 2002); Partial hysterectomy (1984); Foot surgery (1982); Neck surgery; TMJ Arthroplasty (1982); Carpal tunnel release (Right, 05/15/2011); Cervical fusion (2012); Knee arthroscopy (09/01/2012); I&D extremity (09/06/2012); Nose surgery; Revision total  hip arthroplasty (Left, 08/28/2011); I&D extremity (Left, 07/2013); Colonoscopy with propofol (N/A, 12/31/2015); Eye surgery (Bilateral); Lumbar laminectomy/decompression microdiscectomy (N/A, 11/13/2016); Trigger finger release (Right, 05/15/2011); Lumbar fusion (01/08/2012); and Carpal tunnel release (Left, 12/24/2017). Amy Barnes has a current medication list which includes the following prescription(s): albuterol, aspirin, celecoxib, cyclobenzaprine, doxycycline, fluticasone-salmeterol, furosemide, lamotrigine, levothyroxine, liraglutide, losartan, montelukast, multivitamin with minerals, ondansetron, oxcarbazepine, oxycodone-acetaminophen, pantoprazole, promethazine, simvastatin, and zaleplon, and the following Facility-Administered Medications: fentanyl and midazolam. Her primarily concern today is the Back Pain (lower)  Initial Vital Signs:  Pulse/HCG Rate: 84ECG Heart Rate: 91 Temp: 97.6 F (36.4 C) Resp: 18 BP: (!) 153/89 SpO2: 96 %  BMI: Estimated body mass index is 54.93 kg/m as calculated from the following:   Height as of this encounter: 5\' 4"  (1.626 m).   Weight as of this encounter: 320 lb (145.2 kg).  Risk Assessment: Allergies: Reviewed. She is allergic to cephalexin; hydrocodone; risperidone and related; seroquel [quetiapine fumarate]; and sulfa antibiotics.  Allergy Precautions: None required Coagulopathies: Reviewed. None identified.  Blood-thinner therapy: None at this time Active Infection(s): Reviewed. None identified. [name] is afebrile  Site Confirmation: Amy Barnes was asked to confirm the procedure and laterality before marking the site Procedure checklist: Completed Consent: Before the procedure and under the influence of no sedative(s), amnesic(s), or anxiolytics, the patient was informed of the treatment options, risks and possible complications. To fulfill our ethical and legal obligations, as recommended by the American Medical Association's Code of Ethics, I have  informed the patient of my clinical impression; the nature and purpose of the treatment or procedure; the risks, benefits, and possible complications of the intervention; the alternatives, including doing nothing; the risk(s) and benefit(s) of the alternative treatment(s) or procedure(s); and the risk(s) and benefit(s)  of doing nothing. The patient was provided information about the general risks and possible complications associated with the procedure. These may include, but are not limited to: failure to achieve desired goals, infection, bleeding, organ or nerve damage, allergic reactions, paralysis, and death. In addition, the patient was informed of those risks and complications associated to Spine-related procedures, such as failure to decrease pain; infection (i.e.: Meningitis, epidural or intraspinal abscess); bleeding (i.e.: epidural hematoma, subarachnoid hemorrhage, or any other type of intraspinal or peri-dural bleeding); organ or nerve damage (i.e.: Any type of peripheral nerve, nerve root, or spinal cord injury) with subsequent damage to sensory, motor, and/or autonomic systems, resulting in permanent pain, numbness, and/or weakness of one or several areas of the body; allergic reactions; (i.e.: anaphylactic reaction); and/or death. Furthermore, the patient was informed of those risks and complications associated with the medications. These include, but are not limited to: allergic reactions (i.e.: anaphylactic or anaphylactoid reaction(s)); adrenal axis suppression; blood sugar elevation that in diabetics may result in ketoacidosis or comma; water retention that in patients with history of congestive heart failure may result in shortness of breath, pulmonary edema, and decompensation with resultant heart failure; weight gain; swelling or edema; medication-induced neural toxicity; particulate matter embolism and blood vessel occlusion with resultant organ, and/or nervous system infarction; and/or  aseptic necrosis of one or more joints. Finally, the patient was informed that Medicine is not an exact science; therefore, there is also the possibility of unforeseen or unpredictable risks and/or possible complications that may result in a catastrophic outcome. The patient indicated having understood very clearly. We have given the patient no guarantees and we have made no promises. Enough time was given to the patient to ask questions, all of which were answered to the patient's satisfaction. Ms. Steinke has indicated that she wanted to continue with the procedure. Attestation: I, the ordering provider, attest that I have discussed with the patient the benefits, risks, side-effects, alternatives, likelihood of achieving goals, and potential problems during recovery for the procedure that I have provided informed consent. Date  Time: 11/09/2018  8:47 AM  Pre-Procedure Preparation:  Monitoring: As per clinic protocol. Respiration, ETCO2, SpO2, BP, heart rate and rhythm monitor placed and checked for adequate function Safety Precautions: Patient was assessed for positional comfort and pressure points before starting the procedure. Time-out: I initiated and conducted the "Time-out" before starting the procedure, as per protocol. The patient was asked to participate by confirming the accuracy of the "Time Out" information. Verification of the correct person, site, and procedure were performed and confirmed by me, the nursing staff, and the patient. "Time-out" conducted as per Joint Commission's Universal Protocol (UP.01.01.01). Time: 0950  Description of Procedure:          Laterality: Bilateral. The procedure was performed in identical fashion on both sides. Levels:  L2, L3, L4, L5, & S1 Medial Branch Level(s) Area Prepped: Posterior Lumbosacral Region Prepping solution: ChloraPrep (2% chlorhexidine gluconate and 70% isopropyl alcohol) Safety Precautions: Aspiration looking for blood return was  conducted prior to all injections. At no point did we inject any substances, as a needle was being advanced. Before injecting, the patient was told to immediately notify me if she was experiencing any new onset of "ringing in the ears, or metallic taste in the mouth". No attempts were made at seeking any paresthesias. Safe injection practices and needle disposal techniques used. Medications properly checked for expiration dates. SDV (single dose vial) medications used. After the completion of the procedure, all  disposable equipment used was discarded in the proper designated medical waste containers. Local Anesthesia: Protocol guidelines were followed. The patient was positioned over the fluoroscopy table. The area was prepped in the usual manner. The time-out was completed. The target area was identified using fluoroscopy. A 12-in long, straight, sterile hemostat was used with fluoroscopic guidance to locate the targets for each level blocked. Once located, the skin was marked with an approved surgical skin marker. Once all sites were marked, the skin (epidermis, dermis, and hypodermis), as well as deeper tissues (fat, connective tissue and muscle) were infiltrated with a small amount of a short-acting local anesthetic, loaded on a 10cc syringe with a 25G, 1.5-in  Needle. An appropriate amount of time was allowed for local anesthetics to take effect before proceeding to the next step. Local Anesthetic: Lidocaine 2.0% The unused portion of the local anesthetic was discarded in the proper designated containers. Technical explanation of process:  L2 Medial Branch Nerve Block (MBB): The target area for the L2 medial branch is at the junction of the postero-lateral aspect of the superior articular process and the superior, posterior, and medial edge of the transverse process of L3. Under fluoroscopic guidance, a Quincke needle was inserted until contact was made with os over the superior postero-lateral aspect of  the pedicular shadow (target area). After negative aspiration for blood, 0.5 mL of the nerve block solution was injected without difficulty or complication. The needle was removed intact. L3 Medial Branch Nerve Block (MBB): The target area for the L3 medial branch is at the junction of the postero-lateral aspect of the superior articular process and the superior, posterior, and medial edge of the transverse process of L4. Under fluoroscopic guidance, a Quincke needle was inserted until contact was made with os over the superior postero-lateral aspect of the pedicular shadow (target area). After negative aspiration for blood, 0.5 mL of the nerve block solution was injected without difficulty or complication. The needle was removed intact. L4 Medial Branch Nerve Block (MBB): The target area for the L4 medial branch is at the junction of the postero-lateral aspect of the superior articular process and the superior, posterior, and medial edge of the transverse process of L5. Under fluoroscopic guidance, a Quincke needle was inserted until contact was made with os over the superior postero-lateral aspect of the pedicular shadow (target area). After negative aspiration for blood, 0.5 mL of the nerve block solution was injected without difficulty or complication. The needle was removed intact. L5 Medial Branch Nerve Block (MBB): The target area for the L5 medial branch is at the junction of the postero-lateral aspect of the superior articular process and the superior, posterior, and medial edge of the sacral ala. Under fluoroscopic guidance, a Quincke needle was inserted until contact was made with os over the superior postero-lateral aspect of the pedicular shadow (target area). After negative aspiration for blood, 0.5 mL of the nerve block solution was injected without difficulty or complication. The needle was removed intact. S1 Medial Branch Nerve Block (MBB): The target area for the S1 medial branch is at the  posterior and inferior 6 o'clock position of the L5-S1 facet joint. Under fluoroscopic guidance, the Quincke needle inserted for the L5 MBB was redirected until contact was made with os over the inferior and postero aspect of the sacrum, at the 6 o' clock position under the L5-S1 facet joint (Target area). After negative aspiration for blood, 0.5 mL of the nerve block solution was injected without difficulty or complication.  The needle was removed intact.  Nerve block solution: 0.2% PF-Ropivacaine + Triamcinolone (40 mg/mL) diluted to a final concentration of 4 mg of Triamcinolone/mL of Ropivacaine The unused portion of the solution was discarded in the proper designated containers. Procedural Needles: 22-gauge, 7-inch, Quincke needles used for all levels.  Once the entire procedure was completed, the treated area was cleaned, making sure to leave some of the prepping solution back to take advantage of its long term bactericidal properties.   Illustration of the posterior view of the lumbar spine and the posterior neural structures. Laminae of L2 through S1 are labeled. DPRL5, dorsal primary ramus of L5; DPRS1, dorsal primary ramus of S1; DPR3, dorsal primary ramus of L3; FJ, facet (zygapophyseal) joint L3-L4; I, inferior articular process of L4; LB1, lateral branch of dorsal primary ramus of L1; IAB, inferior articular branches from L3 medial branch (supplies L4-L5 facet joint); IBP, intermediate branch plexus; MB3, medial branch of dorsal primary ramus of L3; NR3, third lumbar nerve root; S, superior articular process of L5; SAB, superior articular branches from L4 (supplies L4-5 facet joint also); TP3, transverse process of L3.  Vitals:   11/09/18 1007 11/09/18 1017 11/09/18 1027 11/09/18 1037  BP: (!) 137/99 (!) 168/79 (!) 160/80 (!) 156/82  Pulse:      Resp: 12 12 13 13   Temp:  (!) 97 F (36.1 C)  (!) 97.3 F (36.3 C)  TempSrc:      SpO2: 100% 98% 99% 98%  Weight:      Height:          Start Time: 0950 hrs. End Time: 1004 hrs.  Imaging Guidance (Spinal):          Type of Imaging Technique: Fluoroscopy Guidance (Spinal) Indication(s): Assistance in needle guidance and placement for procedures requiring needle placement in or near specific anatomical locations not easily accessible without such assistance. Exposure Time: Please see nurses notes. Contrast: None used. Fluoroscopic Guidance: I was personally present during the use of fluoroscopy. "Tunnel Vision Technique" used to obtain the best possible view of the target area. Parallax error corrected before commencing the procedure. "Direction-depth-direction" technique used to introduce the needle under continuous pulsed fluoroscopy. Once target was reached, antero-posterior, oblique, and lateral fluoroscopic projection used confirm needle placement in all planes. Images permanently stored in EMR. Interpretation: No contrast injected. I personally interpreted the imaging intraoperatively. Adequate needle placement confirmed in multiple planes. Permanent images saved into the patient's record.  Antibiotic Prophylaxis:   Anti-infectives (From admission, onward)   None     Indication(s): None identified  Post-operative Assessment:  Post-procedure Vital Signs:  Pulse/HCG Rate: 8467 Temp: (!) 97.3 F (36.3 C) Resp: 13 BP: (!) 156/82 SpO2: 98 %  EBL: None  Complications: No immediate post-treatment complications observed by team, or reported by patient.  Note: The patient tolerated the entire procedure well. A repeat set of vitals were taken after the procedure and the patient was kept under observation following institutional policy, for this type of procedure. Post-procedural neurological assessment was performed, showing return to baseline, prior to discharge. The patient was provided with post-procedure discharge instructions, including a section on how to identify potential problems. Should any problems arise  concerning this procedure, the patient was given instructions to immediately contact us, at any time, without hesitation. In any case, we plan to contact the patient by telephone for a follow-up status report regarding this interventional procedure.  Comments:  No additional relevant information.  Plan of Care  Imaging Orders     DG C-Arm 1-60 Min-No Report  Procedure Orders     LUMBAR FACET(MEDIAL BRANCH NERVE BLOCK) MBNB  Medications ordered for procedure: Meds ordered this encounter  Medications  . lidocaine (XYLOCAINE) 2 % (with pres) injection 400 mg  . midazolam (VERSED) 5 MG/5ML injection 1-2 mg    Make sure Flumazenil is available in the pyxis when using this medication. If oversedation occurs, administer 0.2 mg IV over 15 sec. If after 45 sec no response, administer 0.2 mg again over 1 min; may repeat at 1 min intervals; not to exceed 4 doses (1 mg)  . fentaNYL (SUBLIMAZE) injection 25-50 mcg    Make sure Narcan is available in the pyxis when using this medication. In the event of respiratory depression (RR< 8/min): Titrate NARCAN (naloxone) in increments of 0.1 to 0.2 mg IV at 2-3 minute intervals, until desired degree of reversal.  . lactated ringers infusion 1,000 mL  . ropivacaine (PF) 2 mg/mL (0.2%) (NAROPIN) injection 18 mL  . triamcinolone acetonide (KENALOG-40) injection 80 mg   Medications administered: We administered lidocaine, midazolam, fentaNYL, lactated ringers, ropivacaine (PF) 2 mg/mL (0.2%), and triamcinolone acetonide.  See the medical record for exact dosing, route, and time of administration.  Disposition: Discharge home  Discharge Date & Time: 11/09/2018; 1037 hrs.   Physician-requested Follow-up: Return for post-procedure eval (2 wks), w/ Dr. Laban Emperor.  Future Appointments  Date Time Provider Department Center  11/29/2018  9:15 AM Delano Metz, MD [facility] None  01/12/2019 10:30 AM Robert Bellow, LPN [facility]  01/25/2019  8:30 AM  [name], MD [facility]   Primary Care Physician: [name], MD Location: [facility] Note by: Oswaldo Done, MD Date: 11/09/2018; Time: 12:33 PM  Disclaimer:  Medicine is not an Visual merchandiser. The only guarantee in medicine is that nothing is guaranteed. It is important to note that the decision to proceed with this intervention was based on the information collected from the patient. The Data and conclusions were drawn from the patient's questionnaire, the interview, and the physical examination. Because the information was provided in large part by the patient, it cannot be guaranteed that it has not been purposely or unconsciously manipulated. Every effort has been made to obtain as much relevant data as possible for this evaluation. It is important to note that the conclusions that lead to this procedure are derived in large part from the available data. Always take into account that the treatment will also be dependent on availability of resources and existing treatment guidelines, considered by other Pain Management Practitioners as being common knowledge and practice, at the time of the intervention. For Medico-Legal purposes, it is also important to point out that variation in procedural techniques and pharmacological choices are the acceptable norm. The indications, contraindications, technique, and results of the above procedure should only be interpreted and judged by a Board-Certified Interventional Pain Specialist with extensive familiarity and expertise in the same exact procedure and technique.

## 2018-11-10 ENCOUNTER — Telehealth: Payer: Self-pay | Admitting: *Deleted

## 2018-11-10 NOTE — Telephone Encounter (Signed)
Attempted to call for post procedure follow-up. Message left. 

## 2018-11-11 ENCOUNTER — Ambulatory Visit: Payer: Medicare Other

## 2018-11-16 ENCOUNTER — Ambulatory Visit: Payer: Medicare Other

## 2018-11-17 ENCOUNTER — Encounter: Payer: Medicare Other | Admitting: Physical Therapy

## 2018-11-18 ENCOUNTER — Ambulatory Visit: Payer: Medicare Other

## 2018-11-18 ENCOUNTER — Encounter: Payer: Self-pay | Admitting: Family Medicine

## 2018-11-19 MED ORDER — CELECOXIB 200 MG PO CAPS: 200.0000 mg | ORAL_CAPSULE | Freq: Every day | ORAL | 1 refills | Status: DC

## 2018-11-19 NOTE — Telephone Encounter (Signed)
Mailed rx to pt

## 2018-11-19 NOTE — Telephone Encounter (Signed)
Rx printed per pt request and in Lisa's box. plz mail to patient.

## 2018-11-22 ENCOUNTER — Encounter: Payer: Medicare Other | Admitting: Physical Therapy

## 2018-11-23 ENCOUNTER — Ambulatory Visit: Payer: Medicare Other

## 2018-11-24 ENCOUNTER — Encounter: Payer: Medicare Other | Admitting: Physical Therapy

## 2018-11-25 ENCOUNTER — Encounter: Payer: Self-pay | Admitting: Family Medicine

## 2018-11-28 NOTE — Progress Notes (Signed)
Patient's Name: Amy Barnes  MRN: [id number]  Referring Provider: Ria Bush, MD  DOB: [birth year]  PCP: Ria Bush, MD  DOS: 11/29/2018  Note by: Gaspar Cola, MD  Service setting: Ambulatory outpatient  Specialty: Interventional Pain Management  Location: [facility] (AMB) Pain Management Facility    Patient type: Established   Primary Reason(s) for Visit: Encounter for post-procedure evaluation of chronic illness with mild to moderate exacerbation CC: Back Pain (low)  HPI  Amy Barnes is a 68 y.o. year old, female patient, who comes today for a post-procedure evaluation. She has Hypothyroidism; DM type 2 with diabetic peripheral neuropathy ([place]); Hypercholesterolemia; ANEMIA-NOS; Bipolar disorder ([place]); Benign positional vertigo; Essential hypertension; Allergic rhinitis; Asthma; GERD; Osteoarthritis; HIP PAIN; OSA on CPAP; Cervical radiculopathy (Right); Morbid obesity with BMI of 50.0-59.9, adult ([place]); Urinary urgency; MRSA (methicillin resistant Staphylococcus aureus) infection; Abdominal aortic atherosclerosis ([place]); Pedal edema; Chronic pain syndrome; Degenerative joint disease (DJD) of hip; Somatic dysfunction of sacroiliac joint; S/P Lumbar Fusion (L3-4 PLIF); Cervical fusion syndrome; DOE (dyspnea on exertion); Chronic lumbar radicular pain (L4) (Bilateral); Welcome to Medicare preventive visit; Ex-smoker; Nonspecific abnormal electrocardiogram (ECG) (EKG); CAD (coronary artery disease); Centrilobular emphysema ([place]); Thoracic aortic atherosclerosis ([place]); Synovial cyst of lumbar facet joint; Advanced care planning/counseling discussion; PAH (pulmonary artery hypertension) ([place]); 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; Vaginal itching; Hip pain; Infection or inflammatory reaction due to internal joint prosthesis ([place]); Primary osteoarthritis of first carpometacarpal joint of left hand; Chronic low back pain (Primary Area of Pain) (Bilateral) (L>R) w/ sciatica (Left); Chronic lower  extremity pain (Secondary Area of Pain) (Left); Chronic hip pain (Tertiary Area of Pain) (Bilateral) (L>R); Chronic knee pain (Fourth Area of Pain) (Bilateral) (R>L); Pharmacologic therapy; Disorder of skeletal system; Problems influencing health status; Failed back surgical syndrome; History of hip replacement (Left); History of back surgery; Vitamin D insufficiency; Osteoarthritis of facet joint of lumbar spine; Osteoarthritis involving multiple joints; Cervical radiculopathy (Left); Lumbar facet arthropathy (Bilateral); Lumbar facet syndrome (Bilateral) (L>R); Abnormal MRI, lumbar spine (09/16/2018); Lumbar nerve root compression (L4) (Left); Lumbar foraminal stenosis; Chronic hip pain after total replacement x 3 (Left); Spondylosis without myelopathy or radiculopathy, lumbar region; Chronic low back pain (Bilateral) (L>R) w/o sciatica; Bilateral lower extremity edema; Neurogenic urinary incontinence; Urinary incontinence; Bladder spasms; Discogenic low back pain; and Chronic sacroiliac joint pain (Bilateral) (R>L) on their problem list. Her primarily concern today is the Back Pain (low)  Pain Assessment: Location: Lower Back Radiating: radiats to left hip Onset: More than a month ago Duration: Chronic pain Quality: Sharp Severity: 1 /10 (subjective, self-reported pain score)  Note: Reported level is compatible with observation.                         When using our objective Pain Scale, levels between 6 and 10/10 are said to belong in an emergency room, as it progressively worsens from a 6/10, described as severely limiting, requiring emergency care not usually available at an outpatient pain management facility. At a 6/10 level, communication becomes difficult and requires great effort. Assistance to reach the emergency department may be required. Facial flushing and profuse sweating along with potentially dangerous increases in heart rate and blood pressure will be evident. Effect on ADL: limits  bending Timing: Intermittent Modifying factors: injections  BP: (!) 145/86  HR: 74  Amy Barnes comes in today for post-procedure evaluation. Has been experiencing urinary incontinence for years (10-15 years), before having had any back surgery. She had it  before her second hip surgery, around 2012. The second hip surgery did not help the pain and this is when she saw the neurosurgeons for the back. No history of urodynamic tests to check for neurogenic bladder.  Further details on both, my assessment(s), as well as the proposed treatment plan, please see below.  Post-Procedure Assessment  11/09/2018 Procedure: Diagnostic bilateral lumbar facet nerve block #1  under fluoroscopic guidance and IV sedation Pre-procedure pain score:  3/10 Post-procedure pain score: 0/10 (100% relief) Influential Factors: BMI: 54.93 kg/m Intra-procedural challenges: None observed.         Assessment challenges: None detected.              Reported side-effects: None.        Post-procedural adverse reactions or complications: None reported         Sedation: Sedation provided. When no sedatives are used, the analgesic levels obtained are directly associated to the effectiveness of the local anesthetics. However, when sedation is provided, the level of analgesia obtained during the initial 1 hour following the intervention, is believed to be the result of a combination of factors. These factors may include, but are not limited to: 1. The effectiveness of the local anesthetics used. 2. The effects of the analgesic(s) and/or anxiolytic(s) used. 3. The degree of discomfort experienced by the patient at the time of the procedure. 4. The patients ability and reliability in recalling and recording the events. 5. The presence and influence of possible secondary gains and/or psychosocial factors. Reported result: Relief experienced during the 1st hour after the procedure: 100 % (Ultra-Short Term Relief)              Interpretative annotation: Clinically appropriate result. Analgesia during this period is likely to be Local Anesthetic and/or IV Sedative (Analgesic/Anxiolytic) related.          Effects of local anesthetic: The analgesic effects attained during this period are directly associated to the localized infiltration of local anesthetics and therefore cary significant diagnostic value as to the etiological location, or anatomical origin, of the pain. Expected duration of relief is directly dependent on the pharmacodynamics of the local anesthetic used. Long-acting (4-6 hours) anesthetics used.  Reported result: Relief during the next 4 to 6 hour after the procedure: 0 % (Short-Term Relief)            Interpretative annotation: Clinically appropriate result. No analgesic effect would suggest pain etiology to reside elsewhere.          Long-term benefit: Defined as the period of time past the expected duration of local anesthetics (1 hour for short-acting and 4-6 hours for long-acting). With the possible exception of prolonged sympathetic blockade from the local anesthetics, benefits during this period are typically attributed to, or associated with, other factors such as analgesic sensory neuropraxia, antiinflammatory effects, or beneficial biochemical changes provided by agents other than the local anesthetics.  Reported result: Extended relief following procedure: 25 % (Long-Term Relief)            Interpretative annotation: Clinically possible results. Partial relief. Therapeutic failure. Inflammation plays a small part in the etiology to the pain.          Current benefits: Defined as reported results that persistent at this point in time.   Analgesia: 25 % Ms. Mulgrew reports improvement of axial symptoms. Function: Somewhat improved ROM: Somewhat improved Interpretative annotation: Recurrence of symptoms. Limited therapeutic benefit. Results would suggest persistent aggravating factors.  Interpretation: Results would suggest a successful diagnostic intervention.                  Plan:  Re-assessment of algesic etiology.                Laboratory Chemistry  Inflammation Markers (CRP: Acute Phase) (ESR: Chronic Phase) Lab Results  Component Value Date   CRP 5 09/14/2018   ESRSEDRATE 21 09/14/2018                         Renal Markers Lab Results  Component Value Date   BUN 16 09/14/2018   CREATININE 0.58 09/14/2018   BCR 28 09/14/2018   GFRAA 111 09/14/2018   GFRNONAA 96 09/14/2018                             Hepatic Markers Lab Results  Component Value Date   AST 18 09/14/2018   ALT 19 07/09/2018   ALBUMIN 4.7 09/14/2018   HCVAB NEGATIVE 07/01/2016                        Note: Lab results reviewed.  Recent Imaging Results   Results for orders placed in visit on 11/09/18  DG C-Arm 1-60 Min-No Report   Narrative Fluoroscopy was utilized by the requesting physician.  No radiographic  interpretation.                    Interpretation Report: Fluoroscopy was used during the procedure to assist with needle guidance. The images were interpreted intraoperatively by the requesting physician.  Meds   Current Outpatient Medications:  .  albuterol (PROVENTIL HFA;VENTOLIN HFA) 108 (90 BASE) MCG/ACT inhaler, Inhale 2 puffs into the lungs every 6 (six) hours as needed for wheezing or shortness of breath. Reported on 04/17/2016, Disp: , Rfl:  .  aspirin 81 MG EC tablet, Take 81 mg by mouth at bedtime. , Disp: , Rfl:  .  celecoxib (CELEBREX) 200 MG capsule, Take 1 capsule (200 mg total) by mouth daily., Disp: 90 capsule, Rfl: 1 .  cyclobenzaprine (FLEXERIL) 10 MG tablet, Take 1 tablet (10 mg total) by mouth 3 (three) times daily as needed for muscle spasms., Disp: 50 tablet, Rfl: 1 .  doxycycline (VIBRA-TABS) 100 MG tablet, Take 1 tablet (100 mg total) by mouth at bedtime. Reported on 12/31/2015, Disp: 90 tablet, Rfl: 1 .  Fluticasone-Salmeterol (ADVAIR)  250-50 MCG/DOSE AEPB, Inhale 1 puff into the lungs 2 (two) times daily., Disp: , Rfl:  .  furosemide (LASIX) 40 MG tablet, Take 1 tablet (40 mg total) by mouth daily as needed., Disp: , Rfl:  .  lamoTRIgine (LAMICTAL) 200 MG tablet, Take 200 mg by mouth daily.  , Disp: , Rfl:  .  levothyroxine (SYNTHROID, LEVOTHROID) 150 MCG tablet, Take 1 tablet (150 mcg total) by mouth at bedtime., Disp: 90 tablet, Rfl: 3 .  liraglutide (VICTOZA) 18 MG/3ML SOPN, Inject 0.3 mLs (1.8 mg total) into the skin at bedtime., Disp: 27 mL, Rfl: 3 .  losartan (COZAAR) 100 MG tablet, TAKE ONE-HALF TAB DAILY, Disp: 45 tablet, Rfl: 0 .  montelukast (SINGULAIR) 10 MG tablet, TAKE 1 TABLET BY MOUTH EVERYDAY AT BEDTIME, Disp: 90 tablet, Rfl: 3 .  Multiple Vitamin (MULTIVITAMIN WITH MINERALS) TABS tablet, Take 2 tablets by mouth daily. , Disp: , Rfl:  .  ondansetron (ZOFRAN) 8 MG tablet, Take  8 mg by mouth every 8 (eight) hours as needed for nausea or vomiting. , Disp: , Rfl:  .  Oxcarbazepine (TRILEPTAL) 300 MG tablet, Take 300-600 mg by mouth 2 (two) times daily. Pt takes one tablet in the morning and two in the evening., Disp: , Rfl:  .  oxyCODONE-acetaminophen (PERCOCET) 5-325 MG tablet, 1-2 tabs po q6 hours prn pain, Disp: 20 tablet, Rfl: 0 .  pantoprazole (PROTONIX) 40 MG tablet, Take 1 tablet (40 mg total) by mouth daily., Disp: 90 tablet, Rfl: 3 .  promethazine (PHENERGAN) 25 MG tablet, Take 25 mg by mouth every 6 (six) hours as needed for nausea or vomiting., Disp: , Rfl:  .  simvastatin (ZOCOR) 40 MG tablet, TAKE 1 TABLET BY MOUTH EVERY DAY IN THE EVENING, Disp: 90 tablet, Rfl: 3 .  zaleplon (SONATA) 10 MG capsule, Take 10 mg by mouth at bedtime as needed for sleep. Reported on 04/17/2016, Disp: , Rfl:   ROS  Constitutional: Denies any fever or chills Gastrointestinal: No reported hemesis, hematochezia, vomiting, or acute GI distress Musculoskeletal: Denies any acute onset joint swelling, redness, loss of ROM, or  weakness Neurological: No reported episodes of acute onset apraxia, aphasia, dysarthria, agnosia, amnesia, paralysis, loss of coordination, or loss of consciousness  Allergies  Ms. Savitz is allergic to cephalexin; hydrocodone; risperidone and related; seroquel [quetiapine fumarate]; and sulfa antibiotics.  PFSH  Drug: Ms. Schuchart  reports no history of drug use. Alcohol:  reports current alcohol use. Tobacco:  reports that she quit smoking about 9 years ago. Her smoking use included cigarettes. She smoked 1.50 packs per day. She has never used smokeless tobacco. Medical:  has a past medical history of Allergic rhinitis, Anemia, Anxiety, Asthma, Bipolar affective disorder ([place]), CAD (coronary artery disease) (07/2016), Carpal tunnel syndrome, Cataract, Centrilobular emphysema ([place]) (07/2016), Constipation due to pain medication, Depression with anxiety, Diabetes mellitus, GERD (gastroesophageal reflux disease), History of MRSA infection (2015), Hypertension, OSA (obstructive sleep apnea), Osteoarthritis, Osteoarthritis, Pneumonia, Restless legs, Septic arthritis ([place]) (10/11/2012), Shingles (06/30/2016), Shortness of breath, Status post revision of total hip replacement (bilateral), and Thoracic aortic atherosclerosis ([place]) (11/207). Surgical: Ms. Stumpp  has a past surgical history that includes Total hip arthroplasty (Left, 2002); Partial hysterectomy (1984); Foot surgery (1982); Neck surgery; TMJ Arthroplasty (1982); Carpal tunnel release (Right, 05/15/2011); Cervical fusion (2012); Knee arthroscopy (09/01/2012); I&D extremity (09/06/2012); Nose surgery; Revision total hip arthroplasty (Left, 08/28/2011); I&D extremity (Left, 07/2013); Colonoscopy with propofol (N/A, 12/31/2015); Eye surgery (Bilateral); Lumbar laminectomy/decompression microdiscectomy (N/A, 11/13/2016); Trigger finger release (Right, 05/15/2011); Lumbar fusion (01/08/2012); and Carpal tunnel release (Left, 12/24/2017). Family: family history  includes Alcohol abuse in her father; Aneurysm (age of onset: 82) in her father; CAD in an other family member; Cancer in her brother and father; Diabetes in her brother.  Constitutional Exam  General appearance: Well nourished, well developed, and well hydrated. In no apparent acute distress Vitals:   11/29/18 0916 11/29/18 0918  BP:  (!) 145/86  Pulse: 74   Resp: 18   Temp: 97.8 F (36.6 C)   SpO2: 96%   Weight: (!) 320 lb (145.2 kg)   Height: '5\' 4"'$  (1.626 m)    BMI Assessment: Estimated body mass index is 54.93 kg/m as calculated from the following:   Height as of this encounter: '5\' 4"'$  (1.626 m).   Weight as of this encounter: 320 lb (145.2 kg).  BMI interpretation table: BMI level Category Range association with higher incidence of chronic pain  <18 kg/m2 Underweight  18.5-24.9 kg/m2 Ideal body weight   25-29.9 kg/m2 Overweight Increased incidence by 20%  30-34.9 kg/m2 Obese (Class I) Increased incidence by 68%  35-39.9 kg/m2 Severe obesity (Class II) Increased incidence by 136%  >40 kg/m2 Extreme obesity (Class III) Increased incidence by 254%   Patient's current BMI Ideal Body weight  Body mass index is 54.93 kg/m. Ideal body weight: 54.7 kg (120 lb 9.5 oz) Adjusted ideal body weight: 90.9 kg (200 lb 5.7 oz)   BMI Readings from Last 4 Encounters:  11/29/18 54.93 kg/m  11/09/18 54.93 kg/m  10/13/18 54.93 kg/m  09/14/18 56.25 kg/m   Wt Readings from Last 4 Encounters:  11/29/18 (!) 320 lb (145.2 kg)  11/09/18 (!) 320 lb (145.2 kg)  10/13/18 (!) 320 lb (145.2 kg)  09/14/18 (!) 327 lb 11.2 oz (148.6 kg)  Psych/Mental status: Alert, oriented x 3 (person, place, & time)       Eyes: PERLA Respiratory: No evidence of acute respiratory distress  Cervical Spine Area Exam  Skin & Axial Inspection: No masses, redness, edema, swelling, or associated skin lesions Alignment: Symmetrical Functional ROM: Unrestricted ROM      Stability: No instability detected Muscle  Tone/Strength: Functionally intact. No obvious neuro-muscular anomalies detected. Sensory (Neurological): Unimpaired Palpation: No palpable anomalies              Upper Extremity (UE) Exam    Side: Right upper extremity  Side: Left upper extremity  Skin & Extremity Inspection: Skin color, temperature, and hair growth are WNL. No peripheral edema or cyanosis. No masses, redness, swelling, asymmetry, or associated skin lesions. No contractures.  Skin & Extremity Inspection: Skin color, temperature, and hair growth are WNL. No peripheral edema or cyanosis. No masses, redness, swelling, asymmetry, or associated skin lesions. No contractures.  Functional ROM: Unrestricted ROM          Functional ROM: Unrestricted ROM          Muscle Tone/Strength: Functionally intact. No obvious neuro-muscular anomalies detected.  Muscle Tone/Strength: Functionally intact. No obvious neuro-muscular anomalies detected.  Sensory (Neurological): Unimpaired          Sensory (Neurological): Unimpaired          Palpation: No palpable anomalies              Palpation: No palpable anomalies              Provocative Test(s):  Phalen's test: deferred Tinel's test: deferred Apley's scratch test (touch opposite shoulder):  Action 1 (Across chest): deferred Action 2 (Overhead): deferred Action 3 (LB reach): deferred   Provocative Test(s):  Phalen's test: deferred Tinel's test: deferred Apley's scratch test (touch opposite shoulder):  Action 1 (Across chest): deferred Action 2 (Overhead): deferred Action 3 (LB reach): deferred    Thoracic Spine Area Exam  Skin & Axial Inspection: No masses, redness, or swelling Alignment: Symmetrical Functional ROM: Unrestricted ROM Stability: No instability detected Muscle Tone/Strength: Functionally intact. No obvious neuro-muscular anomalies detected. Sensory (Neurological): Unimpaired Muscle strength & Tone: No palpable anomalies  Lumbar Spine Area Exam  Skin & Axial  Inspection: No masses, redness, or swelling Alignment: Symmetrical Functional ROM: Limited ROM affecting both sides Stability: No instability detected Muscle Tone/Strength: Guarding detected Sensory (Neurological): Movement-associated discomfort Palpation: No palpable anomalies       Provocative Tests: Hyperextension/rotation test: (-) bilaterally for facet joint pain. Lumbar quadrant test (Kemp's test): (-) bilaterally for facet joint pain. Lateral bending test: deferred today       Patrick's Maneuver: (+)  for bilateral S-I arthralgia             FABER* test: (+) for bilateral S-I arthralgia             S-I anterior distraction/compression test: deferred today         S-I lateral compression test: deferred today         S-I Thigh-thrust test: deferred today         S-I Gaenslen's test: deferred today         *(Flexion, ABduction and External Rotation)  Gait & Posture Assessment  Ambulation: Unassisted Gait: Relatively normal for age and body habitus Posture: WNL   Lower Extremity Exam    Side: Right lower extremity  Side: Left lower extremity  Stability: No instability observed          Stability: No instability observed          Skin & Extremity Inspection: Skin color, temperature, and hair growth are WNL. No peripheral edema or cyanosis. No masses, redness, swelling, asymmetry, or associated skin lesions. No contractures.  Skin & Extremity Inspection: Skin color, temperature, and hair growth are WNL. No peripheral edema or cyanosis. No masses, redness, swelling, asymmetry, or associated skin lesions. No contractures.  Functional ROM: Restricted ROM for hip and knee joints due to body habitus.  Functional ROM: Restricted ROM for hip and knee joints due to body habitus.  Muscle Tone/Strength: Functionally intact. No obvious neuro-muscular anomalies detected.  Muscle Tone/Strength: Functionally intact. No obvious neuro-muscular anomalies detected.  Sensory (Neurological): Unimpaired         Sensory (Neurological): Unimpaired        DTR: Patellar: deferred today Achilles: deferred today Plantar: deferred today  DTR: Patellar: deferred today Achilles: deferred today Plantar: deferred today  Palpation: No palpable anomalies  Palpation: No palpable anomalies   Assessment   Status Diagnosis  Persistent Persistent Persistent 1. Chronic low back pain (Bilateral) (L>R) w/o sciatica   2. Chronic lower extremity pain (Secondary Area of Pain) (Left)   3. Chronic hip pain (Tertiary Area of Pain) (Bilateral) (L>R)   4. Chronic knee pain (Fourth Area of Pain) (Bilateral) (R>L)   5. Lumbar facet syndrome (Bilateral) (L>R)   6. Failed back surgical syndrome   7. Morbid obesity with BMI of 50.0-59.9, adult ([place])   8. Abnormal MRI, lumbar spine (09/16/2018)   9. Bilateral lower extremity edema   10. Neurogenic urinary incontinence   11. Urinary incontinence, unspecified type   12. Bladder spasms   13. Discogenic low back pain   14. Chronic sacroiliac joint pain (Bilateral) (R>L)      Updated Problems: Problem  Bilateral Lower Extremity Edema  Neurogenic Urinary Incontinence  Bladder Spasms  Discogenic Low Back Pain  Chronic sacroiliac joint pain (Bilateral) (R>L)  Abnormal MRI, lumbar spine (09/16/2018)   LUMBAR MRI FINDINGS: Alignment:  Grade 1 anterolisthesis at L4-5, progressed. Vertebrae: Marrow edema about the bilateral L4-5 facets. Paraspinal and other soft tissues: Dorsal scarring related to L3-4 fusion.  Disc levels: T11-12: Disc narrowing with posterior ridging and shallow protrusion that deflects the spinal cord without compression, stable. L2-3: Minor facet spurring and disc bulging L3-4: PLIF with chronic pedicle screw loosening by radiography. L4-5: Progressive facet degeneration with spurring and Anterolisthesis. Marrow edema about the facets. Progressive disc narrowing with a central protrusion extending to the left foramen where there is L4  compression. L5-S1: Mild to moderate facet spurring.  IMPRESSION: 1. L4-5 progressive disc and facet degeneration since 2018 with  active facet arthritis on both sides. A new central to left foraminal disc protrusion causes L4 foraminal compression. 2. L3-4 PLIF with chronic prominent screw loosening by radiography.  Electronically Signed   By: Monte Fantasia M.D.   On: 09/16/2018 06:47   Chronic hip pain after total replacement x 3 (Left)  Urinary Incontinence   Has experienced urinary incontinence x 15 years.     Plan of Care  Pharmacotherapy (Medications Ordered): No orders of the defined types were placed in this encounter.  Medications administered today: Reita Chard. Keysor had no medications administered during this visit.  Orders:  Orders Placed This Encounter  Procedures  . LUMBAR FACET(MEDIAL BRANCH NERVE BLOCK) MBNB    Standing Status:   Standing    Number of Occurrences:   5    Standing Expiration Date:   11/29/2019    Scheduling Instructions:     Purpose: Diagnostic     Indication: Axial low back pain. Lumbosacral Spondylosis (M47.897).      Side: Bilateral     Level: L3-4, L4-5, & L5-S1 Facets (L2, L3, L4, L5, & S1 Medial Branch Nerves)     Sedation: With Sedation.     TIMEFRAME: PRN procedure. (Ms. Tinch will call when needed.)    Order Specific Question:   Where will this procedure be performed?    Answer:   [facility]  . SACROILIAC JOINT INJECTION    Standing Status:   Future    Standing Expiration Date:   12/30/2018    Scheduling Instructions:     Side: Bilateral     Sedation: With Sedation.     Timeframe: ASAA    Order Specific Question:   Where will this procedure be performed?    Answer:   [facility]  . DG Lumbar Spine Complete W/Bend    In addition to any acute findings, please report on degenerative changes related to: (Please specify level(s)) (1) ROM & instability (>36m displacement) (2) Facet joint (Zygoapophyseal Joint) (3)  DDD and/or IVDD (4) Pars defects (5) Previous surgical changes (Include description of hardware and hardware status, if present) (6) Presence and degree of spondylolisthesis, spondylosis, and/or spondyloarthropathies)  (7) Old Fractures (8) Demineralization (9) Additional bone pathology (10) Stenosis (Central, Lateral Recess, Foraminal) (11) If at all possible, please provide AP diameter (mm) of foraminal and/or central canal.    Standing Status:   Future    Standing Expiration Date:   03/01/2019    Order Specific Question:   Reason for Exam (SYMPTOM  OR DIAGNOSIS REQUIRED)    Answer:   Low back pain    Order Specific Question:   Preferred imaging location?    Answer:   [facility]    Order Specific Question:   Call Results- Best Contact Number?    Answer:   [phone][facility]    Order Specific Question:   Radiology Contrast Protocol - do NOT remove file path    Answer:   [email or web address]  . Amb Ref to Medical Weight Management    Referral Priority:   Routine    Referral Type:   Consultation    Referral Reason:   Specialty Services Required    Number of Visits Requested:   1  . Amb Referral to Bariatric Surgery    Referral Priority:   Routine    Referral Type:   Consultation    Referral Reason:   Specialty Services Required    Number of Visits Requested:   1  .  Ambulatory referral to Urology    Referral Priority:   Routine    Referral Type:   Consultation    Referral Reason:   Specialty Services Required    Requested Specialty:   Urology    Number of Visits Requested:   1   Lab Orders  No laboratory test(s) ordered today    Imaging Orders     DG Lumbar Spine Complete W/Bend  Referral Orders     Amb Ref to Medical Weight Management     Amb Referral to Bariatric Surgery     Ambulatory referral to Urology Interventional management options: Planned follow-up:   Today's physical exam was significant for pain  coming from the sacroiliac joint on performing the Patrick maneuver.  I will be scheduling her to return for a diagnostic bilateral sacroiliac joint block.  The results from her diagnostic lumbar facet block would suggest that this is not really where the pain is coming from.  I have requested that the patient be sent back to Dr. Newman Pies for further evaluation since there appears to be loosening of the fusion hardware and this seems to also coincide with the patient's symptoms. Plan: Return for Procedure (w/ sedation): (B) SI BLK #1.   Considering:   Diagnostic bilateral lumbar facet nerve block  Possible bilateral lumbar facet RFA (Once she brings BMI below 35) Diagnostic bilateral L4 transforaminal LESI  Diagnostic left-sided L3-4 LESI  Diagnostic right-sided intra-articular hip injection  Possible left vs bilateral femoral nerve + obturator nerve block  Possible left vs bilateral femoral nerve + obturator nerve RFA  Diagnostic bilateral intra-articular knee injections (previously done) Diagnostic bilateral Hyalgan series (previously done) Diagnostic bilateral genicular nerve blocks  Possible bilateral genicular nerve RFA    Palliative PRN treatment(s):   Diagnostic bilateral lumbar facet nerve block #2 under fluoroscopic guidance and IV sedation   Future Appointments  Date Time Provider [place]  12/07/2018  8:00 AM Milinda Pointer, MD [facility] None  01/12/2019 10:30 AM Eustace Pen, LPN [facility]  05/31/1114  8:30 AM Ria Bush, MD [facility]   Primary Care Physician: Ria Bush, MD Location: [facility] Note by: Gaspar Cola, MD Date: 11/29/2018; Time: 12:23 PM

## 2018-11-29 ENCOUNTER — Encounter: Payer: Self-pay | Admitting: Pain Medicine

## 2018-11-29 ENCOUNTER — Ambulatory Visit: Payer: Medicare Other | Attending: Pain Medicine | Admitting: Pain Medicine

## 2018-11-29 ENCOUNTER — Other Ambulatory Visit: Payer: Self-pay

## 2018-11-29 VITALS — BP 145/86 | HR 74 | Temp 97.8°F | Resp 18 | Ht 64.0 in | Wt 320.0 lb

## 2018-11-29 DIAGNOSIS — M545 Low back pain, unspecified: Secondary | ICD-10-CM

## 2018-11-29 DIAGNOSIS — M961 Postlaminectomy syndrome, not elsewhere classified: Secondary | ICD-10-CM | POA: Diagnosis not present

## 2018-11-29 DIAGNOSIS — M5136 Other intervertebral disc degeneration, lumbar region with discogenic back pain only: Secondary | ICD-10-CM

## 2018-11-29 DIAGNOSIS — N39498 Other specified urinary incontinence: Secondary | ICD-10-CM | POA: Diagnosis not present

## 2018-11-29 DIAGNOSIS — M25552 Pain in left hip: Secondary | ICD-10-CM | POA: Insufficient documentation

## 2018-11-29 DIAGNOSIS — N3289 Other specified disorders of bladder: Secondary | ICD-10-CM

## 2018-11-29 DIAGNOSIS — M25562 Pain in left knee: Secondary | ICD-10-CM | POA: Diagnosis not present

## 2018-11-29 DIAGNOSIS — M533 Sacrococcygeal disorders, not elsewhere classified: Secondary | ICD-10-CM | POA: Insufficient documentation

## 2018-11-29 DIAGNOSIS — Z6841 Body Mass Index (BMI) 40.0 and over, adult: Secondary | ICD-10-CM | POA: Diagnosis not present

## 2018-11-29 DIAGNOSIS — M47816 Spondylosis without myelopathy or radiculopathy, lumbar region: Secondary | ICD-10-CM | POA: Diagnosis not present

## 2018-11-29 DIAGNOSIS — M25551 Pain in right hip: Secondary | ICD-10-CM | POA: Insufficient documentation

## 2018-11-29 DIAGNOSIS — R937 Abnormal findings on diagnostic imaging of other parts of musculoskeletal system: Secondary | ICD-10-CM

## 2018-11-29 DIAGNOSIS — M79605 Pain in left leg: Secondary | ICD-10-CM | POA: Insufficient documentation

## 2018-11-29 DIAGNOSIS — N3946 Mixed incontinence: Secondary | ICD-10-CM | POA: Insufficient documentation

## 2018-11-29 DIAGNOSIS — R32 Unspecified urinary incontinence: Secondary | ICD-10-CM | POA: Diagnosis not present

## 2018-11-29 DIAGNOSIS — R600 Localized edema: Secondary | ICD-10-CM

## 2018-11-29 DIAGNOSIS — M25561 Pain in right knee: Secondary | ICD-10-CM | POA: Diagnosis not present

## 2018-11-29 DIAGNOSIS — G8929 Other chronic pain: Secondary | ICD-10-CM | POA: Diagnosis not present

## 2018-11-29 NOTE — Progress Notes (Signed)
Safety precautions to be maintained throughout the outpatient stay will include: orient to surroundings, keep bed in low position, maintain call bell within reach at all times, provide assistance with transfer out of bed and ambulation.  

## 2018-11-29 NOTE — Patient Instructions (Addendum)
____________________________________________________________________________________________  Weight Management Required  URGENT: Your weight has been found to be adversely affecting your health.  Dear Amy Barnes:  Your current Body mass index is 54.93 kg/m.Marland Kitchen Estimated body mass index is 54.93 kg/m as calculated from the following:   Height as of this encounter:  (1.626 m).   Weight as of this encounter: 320 lb (145.2 kg).  Your last four (4) weight and BMI calculations are as follows: Wt Readings from Last 4 Encounters:  11/29/18 (!) 320 lb (145.2 kg)  11/09/18 (!) 320 lb (145.2 kg)  10/13/18 (!) 320 lb (145.2 kg)  09/14/18 (!) 327 lb 11.2 oz (148.6 kg)   BMI Readings from Last 4 Encounters:  11/29/18 54.93 kg/m  11/09/18 54.93 kg/m  10/13/18 54.93 kg/m  09/14/18 56.25 kg/m    Calculations estimate your ideal body weight to be: Ideal body weight: 54.7 kg (120 lb 9.5 oz) Adjusted ideal body weight: 90.9 kg (200 lb 5.7 oz)  Please use the table below to identify your weight category and associated incidence of chronic pain, secondary to your weight.  BMI interpretation table: BMI level Category Associated incidence of chronic pain  <18 kg/m2 Underweight   18.5-24.9 kg/m2 Ideal body weight   25-29.9 kg/m2 Overweight  20%  30-34.9 kg/m2 Obese (Class I)  68%  35-39.9 kg/m2 Severe obesity (Class II)  136%  >40 kg/m2 Extreme obesity (Class III)  254%   In addition: You will be considered "Morbidly Obese", if your BMI is above 30 and you have one or more of the following conditions which are known to be directly associated with obesity: 1. Type 2 Diabetes (Which in turn can lead to cardiovascular diseases (CVD), stroke, peripheral vascular diseases (PVD), retinopathy, nephropathy, and neuropathy) 2. Cardiovascular Disease (High Blood Pressure; Congestive Heart Failure; High Cholesterol; Coronary Artery Disease; Angina; or History of Heart Attacks) 3. Breathing problems  (Asthma; obesity-hypoventilation syndrome; obstructive sleep apnea; chronic inflammatory airway disease; reactive airway disease; or shortness of breath) 4. Chronic kidney disease 5. Liver disease (nonalcoholic fatty liver disease) 6. High blood pressure 7. Acid reflux (gastroesophageal reflux disease; heartburn) 8. Osteoarthritis (OA) (with any of the following: hip pain; knee pain; and/or low back pain) 9. Low back pain (Lumbar Facet Syndrome; and/or Degenerative Disc Disease) 10. Hip pain (Osteoarthritis of hip) (For every 1 lbs of added body weight, there is a 2 lbs increase in pressure inside of each hip articulation. 1:2 mechanical relationship) 11. Knee pain (Osteoarthritis of knee) (For every 1 lbs of added body weight, there is a 4 lbs increase in pressure inside of each knee articulation. 1:4 mechanical relationship) (patients with a BMI>30 kg/m2 were 6.8 times more likely to develop knee OA than normal-weight individuals) 12. Certain types of cancer. (Epidemiological studies have shown that obesity is a risk factor for: post-menopausal breast cancer; cancers of the endometrium, colon and kidney cancer; malignant adenomas of the oesophagus. Obese subjects have an approximately 1.5-3.5-fold increased risk of developing these cancers compared with normal-weight subjects, and it has been estimated that between 15 and 45% of these cancers can be attributed to overweight. More recent studies suggest that obesity may also increase the risk of other types of cancer, including pancreatic, hepatic and gallbladder cancer. Ref: Obesity and cancer. Pischon T, Nthlings U, [employer] H. Proc Nutr Soc. 2008 May;67(2):128-45. doi: 10.1017/S0029665108006976.)  Recommendation: At this point it is urgent that you take a step back and concentrate in loosing weight. Dedicate 100% of your efforts on this task.  Nothing else will improve your health more than bringing down your BMI to less than 30. Because most chronic  pain patients do have difficulty exercising secondary to their pain, you must rely on proper nutrition and dieting in order to lose the weight. If your BMI is above 40, you should seriously consider bariatric surgery. A realistic goal is to lose 10% of your body weight over a period of 12 months.  If over time you have unsuccessfully try to lose weight, then it is time for you to seek professional help and to enter a medically supervised weight management program.  Pain management considerations:  1. Pharmacological Problems: Be advised that the use of opioid analgesics (oxycodone; hydrocodone; morphine; methadone; codeine; and all of their derivatives) have been associated with decreased metabolism and weight gain.  For this reason, should we see that you are unable to lose weight while taking these medications, it may become necessary for [place] to taper down and indefinitely discontinue them.  2. Technical Problems: The incidence of successful interventional therapies decreases as the patient's BMI increases. It is much more difficult to accomplish a safe and effective interventional therapy on a patient with a BMI above 35. Yours is Body mass index is 54.93 kg/m.Marland Kitchen  3. Radiation Exposure Problems: The x-rays machine, used to accomplish injection therapies, will automatically increase their x-ray output in order to capture an appropriate bone image. This means that radiation exposure increases exponentially with the patient's BMI. (The higher the BMI, the higher the radiation exposure.) Although the level of radiation used at a given time is still safe to the patient, it is not for the physician and/or assisting staff. Unfortunately, radiation exposure is accumulative. Because physicians and the staff have to do procedures and be exposed on a daily basis, this can result in health problems such as cancer and radiation burns. Radiation exposure to the staff is monitored by the radiation batches that they wear.  The exposure levels are reported back to the staff on a quarterly basis. Depending on levels of exposure, physicians and staff may be obligated by law to decrease this exposure. This means that they have the right and obligation to refuse providing therapies where they may be overexposed to radiation. For this reason, physicians may decline to offer therapies such as radiofrequency ablation or implants to patients with a BMI above 40. 4. Current Trends: Be advised that the current trend is to no longer offer certain therapies to patients with a BMI equal to, or above 35, due to increase perioperative risks, increased technical procedural difficulties, and excessive radiation exposure to healthcare personnel. ____________________________________________________________________________________________  ____________________________________________________________________________________________  Preparing for Procedure with Sedation  Instructions: . Oral Intake: Do not eat or drink anything for at least 8 hours prior to your procedure. . Transportation: Public transportation is not allowed. Bring an adult driver. The driver must be physically present in our waiting room before any procedure can be started. Marland Kitchen Physical Assistance: Bring an adult physically capable of assisting you, in the event you need help. This adult should keep you company at home for at least 6 hours after the procedure. . Blood Pressure Medicine: Take your blood pressure medicine with a sip of water the morning of the procedure. . Blood thinners: Notify our staff if you are taking any blood thinners. Depending on which one you take, there will be specific instructions on how and when to stop it. . Diabetics on insulin: Notify the staff so that you can be scheduled  1st case in the morning. If your diabetes requires high dose insulin, take only  of your normal insulin dose the morning of the procedure and notify the staff that you have  done so. . Preventing infections: Shower with an antibacterial soap the morning of your procedure. . Build-up your immune system: Take 1000 mg of Vitamin C with every meal (3 times a day) the day prior to your procedure. Marland Kitchen Antibiotics: Inform the staff if you have a condition or reason that requires you to take antibiotics before dental procedures. . Pregnancy: If you are pregnant, call and cancel the procedure. . Sickness: If you have a cold, fever, or any active infections, call and cancel the procedure. . Arrival: You must be in the facility at least 30 minutes prior to your scheduled procedure. . Children: Do not bring children with you. . Dress appropriately: Bring dark clothing that you would not mind if they get stained. . Valuables: Do not bring any jewelry or valuables.  Procedure appointments are reserved for interventional treatments only. Marland Kitchen No Prescription Refills. . No medication changes will be discussed during procedure appointments. . No disability issues will be discussed.  Reasons to call and reschedule or cancel your procedure: (Following these recommendations will minimize the risk of a serious complication.) . Surgeries: Avoid having procedures within 2 weeks of any surgery. (Avoid for 2 weeks before or after any surgery). . Flu Shots: Avoid having procedures within 2 weeks of a flu shots or . (Avoid for 2 weeks before or after immunizations). . Barium: Avoid having a procedure within 7-10 days after having had a radiological study involving the use of radiological contrast. (Myelograms, Barium swallow or enema study). . Heart attacks: Avoid any elective procedures or surgeries for the initial 6 months after a "Myocardial Infarction" (Heart Attack). . Blood thinners: It is imperative that you stop these medications before procedures. Let us know if you if you take any blood thinner.  . Infection: Avoid procedures during or within two weeks of an infection (including chest  colds or gastrointestinal problems). Symptoms associated with infections include: Localized redness, fever, chills, night sweats or profuse sweating, burning sensation when voiding, cough, congestion, stuffiness, runny nose, sore throat, diarrhea, nausea, vomiting, cold or Flu symptoms, recent or current infections. It is specially important if the infection is over the area that we intend to treat. Marland Kitchen Heart and lung problems: Symptoms that may suggest an active cardiopulmonary problem include: cough, chest pain, breathing difficulties or shortness of breath, dizziness, ankle swelling, uncontrolled high or unusually low blood pressure, and/or palpitations. If you are experiencing any of these symptoms, cancel your procedure and contact your primary care physician for an evaluation.  Remember:  Regular Business hours are:  Monday to Thursday 8:00 AM to 4:00 PM  Provider's Schedule: Delano Metz, MD:  Procedure days: Tuesday and Thursday 7:30 AM to 4:00 PM  Edward Jolly, MD:  Procedure days: Monday and Wednesday 7:30 AM to 4:00 PM ____________________________________________________________________________________________   ____________________________________________________________________________________________  General Risks and Possible Complications  Patient Responsibilities: It is important that you read this as it is part of your informed consent. It is our duty to inform you of the risks and possible complications associated with treatments offered to you. It is your responsibility as a patient to read this and to ask questions about anything that is not clear or that you believe was not covered in this document.  Patient's Rights: You have the right to refuse treatment. You also have  the right to change your mind, even after initially having agreed to have the treatment done. However, under this last option, if you wait until the last second to change your mind, you may be charged  for the materials used up to that point.  Introduction: Medicine is not an Visual merchandiser. Everything in Medicine, including the lack of treatment(s), carries the potential for danger, harm, or loss (which is by definition: Risk). In Medicine, a complication is a secondary problem, condition, or disease that can aggravate an already existing one. All treatments carry the risk of possible complications. The fact that a side effects or complications occurs, does not imply that the treatment was conducted incorrectly. It must be clearly understood that these can happen even when everything is done following the highest safety standards.  No treatment: You can choose not to proceed with the proposed treatment alternative. The "PRO(s)" would include: avoiding the risk of complications associated with the therapy. The "CON(s)" would include: not getting any of the treatment benefits. These benefits fall under one of three categories: diagnostic; therapeutic; and/or palliative. Diagnostic benefits include: getting information which can ultimately lead to improvement of the disease or symptom(s). Therapeutic benefits are those associated with the successful treatment of the disease. Finally, palliative benefits are those related to the decrease of the primary symptoms, without necessarily curing the condition (example: decreasing the pain from a flare-up of a chronic condition, such as incurable terminal cancer).  General Risks and Complications: These are associated to most interventional treatments. They can occur alone, or in combination. They fall under one of the following six (6) categories: no benefit or worsening of symptoms; bleeding; infection; nerve damage; allergic reactions; and/or death. 1. No benefits or worsening of symptoms: In Medicine there are no guarantees, only probabilities. No healthcare provider can ever guarantee that a medical treatment will work, they can only state the probability that it  may. Furthermore, there is always the possibility that the condition may worsen, either directly, or indirectly, as a consequence of the treatment. 2. Bleeding: This is more common if the patient is taking a blood thinner, either prescription or over the counter (example: Goody Powders, Fish oil, Aspirin, Garlic, etc.), or if suffering a condition associated with impaired coagulation (example: Hemophilia, cirrhosis of the liver, low platelet counts, etc.). However, even if you do not have one on these, it can still happen. If you have any of these conditions, or take one of these drugs, make sure to notify your treating physician. 3. Infection: This is more common in patients with a compromised immune system, either due to disease (example: diabetes, cancer, human immunodeficiency virus [HIV], etc.), or due to medications or treatments (example: therapies used to treat cancer and rheumatological diseases). However, even if you do not have one on these, it can still happen. If you have any of these conditions, or take one of these drugs, make sure to notify your treating physician. 4. Nerve Damage: This is more common when the treatment is an invasive one, but it can also happen with the use of medications, such as those used in the treatment of cancer. The damage can occur to small secondary nerves, or to large primary ones, such as those in the spinal cord and brain. This damage may be temporary or permanent and it may lead to impairments that can range from temporary numbness to permanent paralysis and/or brain death. 5. Allergic Reactions: Any time a substance or material comes in contact with our body,  there is the possibility of an allergic reaction. These can range from a mild skin rash (contact dermatitis) to a severe systemic reaction (anaphylactic reaction), which can result in death. 6. Death: In general, any medical intervention can result in death, most of the time due to an unforeseen  complication. ____________________________________________________________________________________________  Bonita Quin have been instructed to get an xray today.  You have been referred to dietary consult and bariatric surgeon consult. Call when you need a procedure. Do not eat or drink for 8 hours, bring a driver. Please read instructions for procedure with sedation that is attached to here. Preparing for Procedure with Sedation Instructions: . Oral Intake: Do not eat or drink anything for at least 8 hours prior to your procedure. . Transportation: Public transportation is not allowed. Bring an adult driver. The driver must be physically present in our waiting room before any procedure can be started. Marland Kitchen Physical Assistance: Bring an adult capable of physically assisting you, in the event you need help. . Blood Pressure Medicine: Take your blood pressure medicine with a sip of water the morning of the procedure. . Insulin: Take only  of your normal insulin dose. . Preventing infections: Shower with an antibacterial soap the morning of your procedure. . Build-up your immune system: Take 1000 mg of Vitamin C with every meal (3 times a day) the day prior to your procedure. . Pregnancy: If you are pregnant, call and cancel the procedure. . Sickness: If you have a cold, fever, or any active infections, call and cancel the procedure. . Arrival: You must be in the facility at least 30 minutes prior to your scheduled procedure. . Children: Do not bring children with you. . Dress appropriately: Bring dark clothing that you would not mind if they get stained. . Valuables: Do not bring any jewelry or valuables. Procedure appointments are reserved for interventional treatments only. Marland Kitchen No Prescription Refills. . No medication changes will be discussed during procedure appointments. No disability issues will be discussed.

## 2018-11-30 ENCOUNTER — Ambulatory Visit: Payer: Medicare Other

## 2018-12-06 NOTE — Telephone Encounter (Signed)
Mailed rx to pt

## 2018-12-06 NOTE — Telephone Encounter (Signed)
Walker Rx placed in [employer].

## 2018-12-07 ENCOUNTER — Ambulatory Visit: Payer: Medicare Other | Admitting: Pain Medicine

## 2018-12-08 DIAGNOSIS — F3181 Bipolar II disorder: Secondary | ICD-10-CM | POA: Diagnosis not present

## 2018-12-13 ENCOUNTER — Encounter: Payer: Self-pay | Admitting: Family Medicine

## 2018-12-13 NOTE — Telephone Encounter (Signed)
Looks like it's been awhile since pt had this rx.  Is it ok to refill?  If so, how much?

## 2018-12-14 ENCOUNTER — Ambulatory Visit: Payer: Medicare Other | Admitting: Pain Medicine

## 2018-12-14 MED ORDER — ALBUTEROL SULFATE HFA 108 (90 BASE) MCG/ACT IN AERS: 2.0000 | INHALATION_SPRAY | Freq: Four times a day (QID) | RESPIRATORY_TRACT | 6 refills | Status: DC | PRN

## 2018-12-16 ENCOUNTER — Ambulatory Visit: Payer: Medicare Other | Admitting: Pain Medicine

## 2018-12-28 ENCOUNTER — Ambulatory Visit: Payer: Medicare Other | Admitting: Pain Medicine

## 2018-12-29 ENCOUNTER — Ambulatory Visit: Payer: Self-pay | Admitting: Urology

## 2018-12-31 ENCOUNTER — Other Ambulatory Visit: Payer: Self-pay | Admitting: Family Medicine

## 2019-01-12 ENCOUNTER — Telehealth: Payer: Self-pay

## 2019-01-12 ENCOUNTER — Ambulatory Visit (INDEPENDENT_AMBULATORY_CARE_PROVIDER_SITE_OTHER): Payer: Medicare Other

## 2019-01-12 DIAGNOSIS — Z0000 Encounter for general adult medical examination without abnormal findings: Secondary | ICD-10-CM | POA: Diagnosis not present

## 2019-01-12 NOTE — Progress Notes (Signed)
Subjective:   Amy Barnes is a 68 y.o. female who presents for Medicare Annual (Subsequent) preventive examination.  Review of Systems:  N/A Cardiac Risk Factors include: advanced age (>57men, >23 women);diabetes mellitus;obesity (BMI >30kg/m2);dyslipidemia     Objective:     Vitals: There were no vitals taken for this visit.  There is no height or weight on file to calculate BMI.  Advanced Directives 11/29/2018 10/12/2018 01/05/2018 12/24/2017 12/22/2017 11/13/2016 06/09/2016  Does Patient Have a Medical Advance Directive? Yes Yes Yes Yes Yes Yes No  Type of Estate agent of [place];Living will Living will Healthcare Power of [place];Living will Healthcare Power of [place];Living will Living will;Healthcare Power of Attorney - -  Does patient want to make changes to medical advance directive? - No - Patient declined - No - Patient declined - - -  Copy of Healthcare Power of Attorney in Chart? - - No - copy requested No - copy requested - - -  Would patient like information on creating a medical advance directive? - - - - - - -  Pre-existing out of facility DNR order (yellow form or pink MOST form) - - - - - - -    Tobacco Social History   Tobacco Use  Smoking Status Former Smoker  . Packs/day: 1.50  . Types: Cigarettes  . Last attempt to quit: 04/29/2009  . Years since quitting: 9.7  Smokeless Tobacco Never Used     Counseling given: No   Clinical Intake:  Pre-visit preparation completed: Yes  Pain : 0-10 Pain Score: 10-Worst pain ever Pain Type: Chronic pain Pain Location: Back(hips, knees) Pain Orientation: Left, Right, Lower Pain Onset: More than a month ago Pain Frequency: Constant     Nutritional Status: BMI > 30  Obese Nutritional Risks: None  How often do you need to have someone help you when you read instructions, pamphlets, or other written materials from your doctor or pharmacy?: 1 - Never What is the last grade level you completed  in school?: 12th grade  Interpreter Needed?: No  Comments: pt lives with spouse Information entered by :: LPinson, LPN  Past Medical History:  Diagnosis Date  . Allergic rhinitis   . Anemia   . Anxiety   . Asthma    seasonal  . Bipolar affective disorder (HCC)    takes Synthroid meds for Bipolar  . CAD (coronary artery disease) 07/2016   by CT scan  . Carpal tunnel syndrome   . Cataract   . Centrilobular emphysema (HCC) 07/2016   by CT scan - pt not aware of this  . Constipation due to pain medication   . Depression with anxiety   . Diabetes mellitus   . GERD (gastroesophageal reflux disease)   . 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 Hypertension   . OSA (obstructive sleep apnea)    no longer using cpap, uses a bed that raises and lowers hob  . Osteoarthritis   . Osteoarthritis   . Pneumonia   . Restless legs   . Septic arthritis (HCC) 10/11/2012  . Shingles 06/30/2016  . Shortness of breath   . Status post revision of total hip replacement bilateral   prosthetic infection R 2013, L 2015  . Thoracic aortic atherosclerosis (HCC) 11/207   by CT   Past Surgical History:  Procedure Laterality Date  . CARPAL TUNNEL RELEASE Right 05/15/2011  . CARPAL TUNNEL RELEASE Left 12/24/2017  Procedure: LEFT CARPAL TUNNEL RELEASE;  Surgeon: [name], MD;  Location:  [facility];  Service: Orthopedics;  Laterality: Left;  . CERVICAL FUSION  2012   C2/3/4  . COLONOSCOPY WITH PROPOFOL N/A 12/31/2015   diverticulosis, int hem, o/w normal rpt 10 yrs ([facility])  . EYE SURGERY Bilateral    cataract surgery with lens implant  . FOOT SURGERY  1982   bone spur  . I&D EXTREMITY  09/06/2012   Budd Palmer, MD; Right;  I&D of right thigh  . I&D EXTREMITY Left 07/2013   wound vac - daily doxycycline indefinitely  . KNEE ARTHROSCOPY  09/01/2012   Dannielle Huh, MD;  Right  . LUMBAR FUSION  01/08/2012   L3-4  . LUMBAR LAMINECTOMY/DECOMPRESSION  MICRODISCECTOMY N/A 11/13/2016   LAMINOTOMY/LAMINECTOMY LUMBAR FOUR LUMBAR FIVE  WITH RESECTION OF SYNOVIAL CYST;  Surgeon: Tressie Stalker, MD  . NECK SURGERY     Herniated disk C2,3,4  . NOSE SURGERY    . PARTIAL HYSTERECTOMY  1984   for mennorhagia, ovaries remain  . REVISION TOTAL HIP ARTHROPLASTY Left 08/28/2011  . TMJ ARTHROPLASTY  1982  . TOTAL HIP ARTHROPLASTY Left 2002  . TRIGGER FINGER RELEASE Right 05/15/2011   long finger   Family History  Problem Relation Age of Onset  . Aneurysm Father 62       brain  . Alcohol abuse Father   . Cancer Father        possibly  . CAD Other        several siblings  . Cancer Brother        prostate  . Diabetes Brother   . Anesthesia problems Neg Hx   . Hypotension Neg Hx   . Malignant hyperthermia Neg Hx   . Pseudochol deficiency Neg Hx    Social History   Socioeconomic History  . Marital status: Married    Spouse name: Not on file  . Number of children: 3  . Years of education: Not on file  . Highest education level: Not on file  Occupational History  . Occupation: Designer, industrial/product: UNEMPLOYED  Social Needs  . Financial resource strain: Not on file  . Food insecurity:    Worry: Not on file    Inability: Not on file  . Transportation needs:    Medical: Not on file    Non-medical: Not on file  Tobacco Use  . Smoking status: Former Smoker    Packs/day: 1.50    Types: Cigarettes    Last attempt to quit: 04/29/2009    Years since quitting: 9.7  . Smokeless tobacco: Never Used  Substance and Sexual Activity  . Alcohol use: Yes    Alcohol/week: 14.0 standard drinks    Types: 14 Shots of liquor per week  . Drug use: No  . Sexual activity: Never  Lifestyle  . Physical activity:    Days per week: Not on file    Minutes per session: Not on file  . Stress: Not on file  Relationships  . Social connections:    Talks on phone: Not on file    Gets together: Not on file    Attends religious service: Not on file     Active member of club or organization: Not on file    Attends meetings of clubs or organizations: Not on file    Relationship status: Not on file  Other Topics Concern  . 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 albuterol (PROVENTIL HFA;VENTOLIN HFA) 108 (90 Base) MCG/ACT inhaler Inhale 2 puffs into the lungs every 6 (six) hours as needed for wheezing or shortness of breath.  Marland Kitchen aspirin 81 MG EC tablet Take 81 mg by mouth at bedtime.   . celecoxib (CELEBREX) 200 MG capsule Take 1 capsule (200 mg total) by mouth daily.  . cyclobenzaprine (FLEXERIL) 10 MG tablet Take 1 tablet (10 mg total) by mouth 3 (three) times daily as needed for muscle spasms.  Marland Kitchen doxycycline (VIBRA-TABS) 100 MG tablet Take 1 tablet (100 mg total) by mouth at bedtime. Reported on 12/31/2015  . Fluticasone-Salmeterol (ADVAIR) 250-50 MCG/DOSE AEPB Inhale 1 puff into the lungs 2 (two) times daily.  . furosemide (LASIX) 40 MG tablet Take 1 tablet (40 mg total) by mouth daily as needed.  . lamoTRIgine (LAMICTAL) 200 MG tablet Take 200 mg by mouth daily.    Marland Kitchen levothyroxine (SYNTHROID, LEVOTHROID) 150 MCG tablet Take 1 tablet (150 mcg total) by mouth at bedtime.  . liraglutide (VICTOZA) 18 MG/3ML SOPN Inject 0.3 mLs (1.8 mg total) into the skin at bedtime.  Marland Kitchen losartan (COZAAR) 100 MG tablet TAKE ONE-HALF TAB DAILY  . montelukast (SINGULAIR) 10 MG tablet TAKE 1 TABLET BY MOUTH EVERYDAY AT BEDTIME  . Multiple Vitamin (MULTIVITAMIN WITH MINERALS) TABS tablet Take 2 tablets by mouth daily.   . ondansetron (ZOFRAN) 8 MG tablet Take 8 mg by mouth every 8 (eight) hours as needed for nausea or vomiting.   . Oxcarbazepine (TRILEPTAL) 300 MG tablet Take 300-600 mg by mouth 2 (two) times daily. Pt  takes one tablet in the morning and two in the evening.  Marland Kitchen oxyCODONE-acetaminophen (PERCOCET) 5-325 MG tablet 1-2 tabs po q6 hours prn pain  . pantoprazole (PROTONIX) 40 MG tablet Take 1 tablet (40 mg total) by mouth daily.  . promethazine (PHENERGAN) 25 MG tablet Take 25 mg by mouth every 6 (six) hours as needed for nausea or vomiting.  . simvastatin (ZOCOR) 40 MG tablet TAKE 1 TABLET BY MOUTH EVERY DAY IN THE EVENING  . zaleplon (SONATA) 10 MG capsule Take 10 mg by mouth at bedtime as needed for sleep. Reported on 04/17/2016   No facility-administered encounter medications on file as of 01/12/2019.     Activities of Daily Living In your present state of health, do you have any difficulty performing the following activities: 01/12/2019  Hearing? N  Vision? N  Difficulty concentrating or making decisions? N  Walking or climbing stairs? Y  Dressing or bathing? N  Doing errands, shopping? N  Preparing Food and eating ? N  Using the Toilet? N  In the past six months, have you accidently leaked urine? N  Do you have problems with loss of bowel control? N  Managing your Medications? N  Managing your Finances? N  Housekeeping or managing your Housekeeping? N  Some recent data might be hidden    Patient Care Team: [name], MD as PCP - General (Family Medicine)    Assessment:   This is a routine wellness examination for [place].  Vision Screening Comments: Vision exam in 2019  Exercise Activities and Dietary recommendations Current Exercise Habits: Structured exercise class, Type of exercise: Other - see comments(silver sneakers, water aerobics), Time (Minutes):  45, Frequency (Times/Week): 4, Weekly Exercise (Minutes/Week): 180, Intensity: Moderate, Exercise limited by: None identified  Goals    . Increase physical activity     Weather permitting, I will continue to walk for 3000-7000 steps daily.        Fall Risk Fall Risk  01/12/2019 11/29/2018 11/09/2018 10/13/2018  09/14/2018  Falls in the past year? 0 0 0 0 1  Number falls in past yr: - - - - 1  Injury with Fall? - - - - 1  Risk for fall due to : - - - - Impaired balance/gait  Follow up - - - - -   Depression Screen PHQ 2/9 Scores 01/12/2019 11/29/2018 11/09/2018 01/05/2018  PHQ - 2 Score 0 0 0 0  PHQ- 9 Score 0 - - 0     Cognitive Function MMSE - Mini Mental State Exam 01/12/2019 01/05/2018  Not completed: Unable to complete -  Orientation to time - 5  Orientation to Place - 5  Registration - 3  Attention/ Calculation - 0  Recall - 3  Language- name 2 objects - 0  Language- repeat - 1  Language- follow 3 step command - 3  Language- read & follow direction - 0  Write a sentence - 0  Copy design - 0  Total score - 20        Immunization History  Administered Date(s) Administered  . Influenza Split 08/15/2011, 08/30/2012  . Influenza Whole 07/24/2007, 07/19/2009, 06/25/2010  . Influenza,inj,Quad PF,6+ Mos 07/08/2016, 06/25/2017, 07/12/2018  . Influenza-Unspecified 05/05/2011, 06/20/2015  . Pneumococcal Conjugate-13 01/12/2018  . Pneumococcal Polysaccharide-23 08/30/2012  . Td 06/30/2007  . Zoster 12/29/2013    Screening Tests Health Maintenance  Topic Date Due  . DTaP/Tdap/Td (1 - Tdap) 06/08/1971  . MAMMOGRAM  12/31/2011  . DEXA SCAN  06/07/2017  . TETANUS/TDAP  06/29/2017  . OPHTHALMOLOGY EXAM  07/13/2018  . HEMOGLOBIN A1C  01/08/2019  . PNA vac Low Risk Adult (2 of 2 - PPSV23) 01/13/2019  . INFLUENZA VACCINE  04/30/2019  . FOOT EXAM  07/13/2019  . COLONOSCOPY  12/30/2025  . Hepatitis C Screening  Completed     Plan:     I have personally reviewed, addressed, and noted the following in the patient's chart:  A. Medical and social history B. Use of alcohol, tobacco or illicit drugs  C. Current medications and supplements D. Functional ability and status E.  Nutritional status F.  Physical activity G. Advance directives H. List of other physicians I.  Hospitalizations,  surgeries, and ER visits in previous 12 months J.  Vitals K. Screenings to include hearing, vision, cognitive, depression L. Referrals and appointments - none  In addition, I have reviewed and discussed with patient certain preventive protocols, quality metrics, and best practice recommendations. A written personalized care plan for preventive services as well as general preventive health recommendations were provided to patient.  See attached scanned questionnaire for additional information.   Signed,   Randa Evens, MHA, BS, LPN Health Coach

## 2019-01-12 NOTE — Telephone Encounter (Signed)
Noted! Thank you

## 2019-01-12 NOTE — Patient Instructions (Signed)
Amy Barnes , Thank you for taking time to come for your Medicare Wellness Visit. I appreciate your ongoing commitment to your health goals. Please review the following plan we discussed and let me know if I can assist you in the future.   These are the goals we discussed: Goals    . Increase physical activity     Weather permitting, I will continue to walk for 3000-7000 steps daily.        This is a list of the screening recommended for you and due dates:  Health Maintenance  Topic Date Due  . Hemoglobin A1C  01/27/2019*  . Eye exam for diabetics  09/29/2019*  . DTaP/Tdap/Td vaccine (1 - Tdap) 01/12/2020*  . Mammogram  01/12/2020*  . DEXA scan (bone density measurement)  01/12/2020*  . Tetanus Vaccine  09/28/2020*  . Pneumonia vaccines (2 of 2 - PPSV23) 01/13/2019  . Flu Shot  04/30/2019  . Complete foot exam   07/13/2019  . Colon Cancer Screening  12/30/2025  .  Hepatitis C: One time screening is recommended by Center for Disease Control  (CDC) for  adults born from [birth year] through 1964.   Completed  *Topic was postponed. The date shown is not the original due date.   Preventive Care for Adults  A healthy lifestyle and preventive care can promote health and wellness. Preventive health guidelines for adults include the following key practices.  . A routine yearly physical is a good way to check with your health care provider about your health and preventive screening. It is a chance to share any concerns and updates on your health and to receive a thorough exam.  . Visit your dentist for a routine exam and preventive care every 6 months. Brush your teeth twice a day and floss once a day. Good oral hygiene prevents tooth decay and gum disease.  . The frequency of eye exams is based on your age, health, family medical history, use  of contact lenses, and other factors. Follow your health care provider's recommendations for frequency of eye exams.  . Eat a healthy diet. Foods like  vegetables, fruits, whole grains, low-fat dairy products, and lean protein foods contain the nutrients you need without too many calories. Decrease your intake of foods high in solid fats, added sugars, and salt. Eat the right amount of calories for you. Get information about a proper diet from your health care provider, if necessary.  . Regular physical exercise is one of the most important things you can do for your health. Most adults should get at least 150 minutes of moderate-intensity exercise (any activity that increases your heart rate and causes you to sweat) each week. In addition, most adults need muscle-strengthening exercises on 2 or more days a week.  Silver Sneakers may be a benefit available to you. To determine eligibility, you may visit the website: www.silversneakers.com or contact program at 509-819-0462 Mon-Fri between 8AM-8PM.   . Maintain a healthy weight. The body mass index (BMI) is a screening tool to identify possible weight problems. It provides an estimate of body fat based on height and weight. Your health care provider can find your BMI and can help you achieve or maintain a healthy weight.   For adults 20 years and older: ? A BMI below 18.5 is considered underweight. ? A BMI of 18.5 to 24.9 is normal. ? A BMI of 25 to 29.9 is considered overweight. ? A BMI of 30 and above is considered obese.   Marland Kitchen  Maintain normal blood lipids and cholesterol levels by exercising and minimizing your intake of saturated fat. Eat a balanced diet with plenty of fruit and vegetables. Blood tests for lipids and cholesterol should begin at age 10 and be repeated every 5 years. If your lipid or cholesterol levels are high, you are over 50, or you are at high risk for heart disease, you may need your cholesterol levels checked more frequently. Ongoing high lipid and cholesterol levels should be treated with medicines if diet and exercise are not working.  . If you smoke, find out from your  health care provider how to quit. If you do not use tobacco, please do not start.  . If you choose to drink alcohol, please do not consume more than 2 drinks per day. One drink is considered to be 12 ounces (355 mL) of beer, 5 ounces (148 mL) of wine, or 1.5 ounces (44 mL) of liquor.  . If you are 67-67 years old, ask your health care provider if you should take aspirin to prevent strokes.  . Use sunscreen. Apply sunscreen liberally and repeatedly throughout the day. You should seek shade when your shadow is shorter than you. Protect yourself by wearing long sleeves, pants, a wide-brimmed hat, and sunglasses year round, whenever you are outdoors.  . Once a month, do a whole body skin exam, using a mirror to look at the skin on your back. Tell your health care provider of new moles, moles that have irregular borders, moles that are larger than a pencil eraser, or moles that have changed in shape or color.

## 2019-01-12 NOTE — Progress Notes (Signed)
Virtual Visit via Video Note  I connected with Amy Barnes on 01/12/19 at 10:30 AM EDT by a video enabled telemedicine application and verified that I am speaking with the correct person using two identifiers. Patient agreed to complete annual wellness visit via telemedicine.    Patient was in her home and I was located in my office.  Randa Evens, LPN

## 2019-01-12 NOTE — Progress Notes (Signed)
PCP notes:   Health maintenance:  Immunizations, eye exam, and other preventive health screenings have been postponed due to COVID-19.  Abnormal screenings:   None  Patient concerns:   Pain management - appt scheduled  Nurse concerns:  None  Next PCP appt:   01/13/19 @ 1030

## 2019-01-12 NOTE — Telephone Encounter (Signed)
AWV today. During pain assessment, patient stated she refused last injection with pain management doctor. States she still has Percocet available but does not like to take narcotics. Patient has been using BioFreeze patches that have been effective. Discussed possibility of minimizing cost if they were prescribed instead of purchased OTC. Discussed Lidoderm patches. Patient has never used these for pain management. Advised patient that PCP will make determination as to whether these are viable for pain management.  Scheduled virtual video appointment with PCP on 01/13/19 @1030  to discuss pain management patches she can use until she has back surgery.

## 2019-01-13 ENCOUNTER — Encounter: Payer: Self-pay | Admitting: Family Medicine

## 2019-01-13 ENCOUNTER — Ambulatory Visit (INDEPENDENT_AMBULATORY_CARE_PROVIDER_SITE_OTHER): Payer: Medicare Other | Admitting: Family Medicine

## 2019-01-13 ENCOUNTER — Other Ambulatory Visit: Payer: Self-pay | Admitting: Family Medicine

## 2019-01-13 VITALS — BP 186/83 | HR 74 | Ht 63.5 in

## 2019-01-13 DIAGNOSIS — E039 Hypothyroidism, unspecified: Secondary | ICD-10-CM

## 2019-01-13 DIAGNOSIS — M5442 Lumbago with sciatica, left side: Secondary | ICD-10-CM

## 2019-01-13 DIAGNOSIS — I10 Essential (primary) hypertension: Secondary | ICD-10-CM | POA: Diagnosis not present

## 2019-01-13 DIAGNOSIS — E559 Vitamin D deficiency, unspecified: Secondary | ICD-10-CM

## 2019-01-13 DIAGNOSIS — M961 Postlaminectomy syndrome, not elsewhere classified: Secondary | ICD-10-CM | POA: Diagnosis not present

## 2019-01-13 DIAGNOSIS — R3915 Urgency of urination: Secondary | ICD-10-CM | POA: Diagnosis not present

## 2019-01-13 DIAGNOSIS — G8929 Other chronic pain: Secondary | ICD-10-CM | POA: Diagnosis not present

## 2019-01-13 DIAGNOSIS — G894 Chronic pain syndrome: Secondary | ICD-10-CM | POA: Diagnosis not present

## 2019-01-13 DIAGNOSIS — D649 Anemia, unspecified: Secondary | ICD-10-CM

## 2019-01-13 DIAGNOSIS — E7800 Pure hypercholesterolemia, unspecified: Secondary | ICD-10-CM

## 2019-01-13 DIAGNOSIS — E1142 Type 2 diabetes mellitus with diabetic polyneuropathy: Secondary | ICD-10-CM

## 2019-01-13 MED ORDER — LIDOCAINE 5 % EX PTCH: 1.0000 | MEDICATED_PATCH | CUTANEOUS | 0 refills | Status: DC

## 2019-01-13 NOTE — Assessment & Plan Note (Addendum)
Concern for loosening of hardware - pt is trying to postpone revision low back surgery as much as able.  Will price out lidocaine patches. If unaffordable, will continue biofreeze patches.  Discussed scheduled tylenol for better pain control.

## 2019-01-13 NOTE — Assessment & Plan Note (Signed)
Chronic, deteriorated. She will monitor BP at home and if consistently elevated let me know to increase losartan dose. I did advise she take 1 lasix today given endorsed leg swelling as well. Planned lab visit tomorrow.

## 2019-01-13 NOTE — Assessment & Plan Note (Addendum)
Will check UA. Consider trial antimuscarinic pending results.  Uro appt has been postponed to June due to Covid19.

## 2019-01-13 NOTE — Progress Notes (Signed)
Virtual visit completed through [employer]. Due to national recommendations of social distancing due to COVID 19, a virtual visit is felt to be most appropriate for this patient at this time.   Patient location: home Provider location: [place] at [facility], office If any vitals were documented, they were collected by patient at home unless specified below.    BP (!) 186/83 (BP Location: Left Arm, Patient Position: Sitting, Cuff Size: Normal)   Pulse 74   Ht 5' 3.5" (1.613 m)   BMI 55.80 kg/m    On repeat bp check at home (wrist cuff) - persistently elevated  CC: pain med discussion Subjective:    Patient ID: Amy Barnes, female    DOB: 07-25-1951, 68 y.o.   MRN: [id number]  HPI: Amy Barnes is a 68 y.o. female presenting on 01/13/2019 for Chronic Pain Management and Medication Refill (Requests rx for Diflucan)   Chronic pain in complicated ortho history - followed by pain management Dr Laban Emperor. She is on percocets but tries to minimize this use. Last seen last month, recommended diagnostic bilateral SIJ blocks after poor response to diagnostic lumbar facet blocks. She has decided to defer this injection. She was also referred back to neurosurgery over concern for loosening of fusion hardware. Neurosurgery has already recommended rpt surgery.   Most recently celebrex 200mg  started last year (more effective than mobic).   Continues to use TENS unit, ice, OTC patches (biofreeze), heat. Doesn't take tylenol. Occasional ibuprofen.    Notes ongoing urinary urgency and spasms worse since latest facet injections. Increased urinary accidents. Going through 6-8 pads a day. No dysuria, fevers or abd pain. She does not feel she completely empties when urinating. Noting leg swelling.      Relevant past medical, surgical, family and social history reviewed and updated as indicated. Interim medical history since our last visit reviewed. Allergies and medications reviewed and updated.  Outpatient Medications Prior to Visit  Medication Sig Dispense Refill  . albuterol (PROVENTIL HFA;VENTOLIN HFA) 108 (90 Base) MCG/ACT inhaler Inhale 2 puffs into the lungs every 6 (six) hours as needed for wheezing or shortness of breath. 1 Inhaler 6  . aspirin 81 MG EC tablet Take 81 mg by mouth at bedtime.     . celecoxib (CELEBREX) 200 MG capsule Take 1 capsule (200 mg total) by mouth daily. 90 capsule 1  . cyclobenzaprine (FLEXERIL) 10 MG tablet Take 1 tablet (10 mg total) by mouth 3 (three) times daily as needed for muscle spasms. 50 tablet 1  . doxycycline (VIBRA-TABS) 100 MG tablet Take 1 tablet (100 mg total) by mouth at bedtime. Reported on 12/31/2015 90 tablet 1  . Fluticasone-Salmeterol (ADVAIR) 250-50 MCG/DOSE AEPB Inhale 1 puff into the lungs 2 (two) times daily.    . furosemide (LASIX) 40 MG tablet Take 1 tablet (40 mg total) by mouth daily as needed.    . lamoTRIgine (LAMICTAL) 200 MG tablet Take 200 mg by mouth daily.      Marland Kitchen levothyroxine (SYNTHROID, LEVOTHROID) 150 MCG tablet Take 1 tablet (150 mcg total) by mouth at bedtime. 90 tablet 3  . liraglutide (VICTOZA) 18 MG/3ML SOPN Inject 0.3 mLs (1.8 mg total) into the skin at bedtime. 27 mL 3  . losartan (COZAAR) 100 MG tablet TAKE ONE-HALF TAB DAILY 45 tablet 0  . montelukast (SINGULAIR) 10 MG tablet TAKE 1 TABLET BY MOUTH EVERYDAY AT BEDTIME 90 tablet 3  . Multiple Vitamin (MULTIVITAMIN WITH MINERALS) TABS tablet Take 2 tablets by mouth  daily.     . ondansetron (ZOFRAN) 8 MG tablet Take 8 mg by mouth every 8 (eight) hours as needed for nausea or vomiting.     . Oxcarbazepine (TRILEPTAL) 300 MG tablet Take 300-600 mg by mouth 2 (two) times daily. Pt takes one tablet in the morning and two in the evening.    Marland Kitchen oxyCODONE-acetaminophen (PERCOCET) 5-325 MG tablet 1-2 tabs po q6 hours prn pain 20 tablet 0  . pantoprazole (PROTONIX) 40 MG tablet Take 1 tablet (40 mg total) by mouth daily. 90 tablet 3  . promethazine (PHENERGAN) 25 MG  tablet Take 25 mg by mouth every 6 (six) hours as needed for nausea or vomiting.    . simvastatin (ZOCOR) 40 MG tablet TAKE 1 TABLET BY MOUTH EVERY DAY IN THE EVENING 90 tablet 0  . zaleplon (SONATA) 10 MG capsule Take 10 mg by mouth at bedtime as needed for sleep. Reported on 04/17/2016     No facility-administered medications prior to visit.      Per HPI unless specifically indicated in ROS section below Review of Systems Objective:    BP (!) 186/83 (BP Location: Left Arm, Patient Position: Sitting, Cuff Size: Normal)   Pulse 74   Ht 5' 3.5" (1.613 m)   BMI 55.80 kg/m   Wt Readings from Last 3 Encounters:  11/29/18 (!) 320 lb (145.2 kg)  11/09/18 (!) 320 lb (145.2 kg)  10/13/18 (!) 320 lb (145.2 kg)     Physical exam: Gen: alert, NAD, not ill appearing Pulm: speaks in complete sentences without increased work of breathing Psych: normal mood, normal thought content       Lab Results  Component Value Date   HGBA1C 5.8 07/09/2018    Assessment & Plan:   Problem List Items Addressed This Visit    Urinary urgency    Will check UA. Consider trial antimuscarinic pending results.  Uro appt has been postponed to June due to Covid19.       Relevant Orders   POCT Urinalysis Dipstick (Automated)   Failed back surgical syndrome (Chronic)   Essential hypertension    Chronic, deteriorated. She will monitor BP at home and if consistently elevated let me know to increase losartan dose. I did advise she take 1 lasix today given endorsed leg swelling as well. Planned lab visit tomorrow.       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  Will price out lidocaine patches. If unaffordable, will continue biofreeze patches.  Discussed scheduled tylenol for better pain control.            Meds ordered this encounter  Medications  . lidocaine (LIDODERM) 5 %    Sig: Place 1 patch onto the skin daily. Remove & Discard patch within 12 hours or as directed by MD    Dispense:  30 patch    Refill:  0   Orders Placed This Encounter  Procedures  . POCT Urinalysis Dipstick (Automated)    Standing Status:   Future    Standing Expiration Date:   02/12/2019    Follow up plan: No follow-ups on file.  [name], MD

## 2019-01-13 NOTE — Patient Instructions (Signed)
lidoderm patch sent to pharmacy to price out. Start taking tylenol 500mg  daily every day for baseline pain control. May increase to 500mg  twice daily.  Schedule labs and urinalysis for tomorrow. Reschedule follow up virtual appointment to next week.

## 2019-01-14 ENCOUNTER — Other Ambulatory Visit (INDEPENDENT_AMBULATORY_CARE_PROVIDER_SITE_OTHER): Payer: Medicare Other

## 2019-01-14 DIAGNOSIS — E1142 Type 2 diabetes mellitus with diabetic polyneuropathy: Secondary | ICD-10-CM | POA: Diagnosis not present

## 2019-01-14 DIAGNOSIS — E039 Hypothyroidism, unspecified: Secondary | ICD-10-CM

## 2019-01-14 DIAGNOSIS — R3915 Urgency of urination: Secondary | ICD-10-CM | POA: Diagnosis not present

## 2019-01-14 DIAGNOSIS — D649 Anemia, unspecified: Secondary | ICD-10-CM

## 2019-01-14 DIAGNOSIS — E7800 Pure hypercholesterolemia, unspecified: Secondary | ICD-10-CM

## 2019-01-14 DIAGNOSIS — E559 Vitamin D deficiency, unspecified: Secondary | ICD-10-CM | POA: Diagnosis not present

## 2019-01-14 LAB — COMPREHENSIVE METABOLIC PANEL
ALT: 22 U/L (ref 0–35)
AST: 19 U/L (ref 0–37)
Albumin: 4.3 g/dL (ref 3.5–5.2)
Alkaline Phosphatase: 64 U/L (ref 39–117)
BUN: 25 mg/dL — ABNORMAL HIGH (ref 6–23)
CO2: 31 mEq/L (ref 19–32)
Calcium: 9.6 mg/dL (ref 8.4–10.5)
Chloride: 99 mEq/L (ref 96–112)
Creatinine, Ser: 0.66 mg/dL (ref 0.40–1.20)
GFR: 89.44 mL/min (ref >60.00)
Glucose, Bld: 158 mg/dL — ABNORMAL HIGH (ref 70–99)
Potassium: 4.1 mEq/L (ref 3.5–5.1)
Sodium: 139 mEq/L (ref 135–145)
Total Bilirubin: 0.5 mg/dL (ref 0.2–1.2)
Total Protein: 7 g/dL (ref 6.0–8.3)

## 2019-01-14 LAB — POC URINALSYSI DIPSTICK (AUTOMATED)
Bilirubin, UA: NEGATIVE
Blood, UA: NEGATIVE
Glucose, UA: NEGATIVE
Ketones, UA: NEGATIVE
Leukocytes, UA: NEGATIVE
Nitrite, UA: NEGATIVE
Protein, UA: NEGATIVE
Spec Grav, UA: 1.015 (ref 1.010–1.025)
Urobilinogen, UA: 0.2 E.U./dL
pH, UA: 7 (ref 5.0–8.0)

## 2019-01-14 LAB — CBC WITH DIFFERENTIAL/PLATELET
Basophils Absolute: 0 10*3/uL (ref 0.0–0.1)
Basophils Relative: 0.5 % (ref 0.0–3.0)
Eosinophils Absolute: 0.2 10*3/uL (ref 0.0–0.7)
Eosinophils Relative: 2.9 % (ref 0.0–5.0)
HCT: 37.9 % (ref 36.0–46.0)
Hemoglobin: 12.7 g/dL (ref 12.0–15.0)
Lymphocytes Relative: 22.5 % (ref 12.0–46.0)
Lymphs Abs: 1.3 10*3/uL (ref 0.7–4.0)
MCHC: 33.5 g/dL (ref 30.0–36.0)
MCV: 87.9 fl (ref 78.0–100.0)
Monocytes Absolute: 0.5 10*3/uL (ref 0.1–1.0)
Monocytes Relative: 9.3 % (ref 3.0–12.0)
Neutro Abs: 3.8 10*3/uL (ref 1.4–7.7)
Neutrophils Relative %: 64.8 % (ref 43.0–77.0)
Platelets: 234 10*3/uL (ref 150.0–400.0)
RBC: 4.31 Mil/uL (ref 3.87–5.11)
RDW: 15.6 % — ABNORMAL HIGH (ref 11.5–15.5)
WBC: 5.8 10*3/uL (ref 4.0–10.5)

## 2019-01-14 LAB — LIPID PANEL
Cholesterol: 170 mg/dL (ref 0–200)
HDL: 83.4 mg/dL (ref >39.00)
LDL Cholesterol: 74 mg/dL (ref 0–99)
NonHDL: 86.75
Total CHOL/HDL Ratio: 2
Triglycerides: 66 mg/dL (ref 0.0–149.0)
VLDL: 13.2 mg/dL (ref 0.0–40.0)

## 2019-01-14 LAB — VITAMIN D 25 HYDROXY (VIT D DEFICIENCY, FRACTURES): VITD: 38.18 ng/mL (ref 30.00–100.00)

## 2019-01-14 LAB — TSH: TSH: 1.86 u[IU]/mL (ref 0.35–4.50)

## 2019-01-14 LAB — HEMOGLOBIN A1C: Hgb A1c MFr Bld: 6.3 % (ref 4.6–6.5)

## 2019-01-17 ENCOUNTER — Other Ambulatory Visit: Payer: Self-pay | Admitting: Family Medicine

## 2019-01-17 ENCOUNTER — Other Ambulatory Visit: Payer: Self-pay | Admitting: Pain Medicine

## 2019-01-17 ENCOUNTER — Telehealth: Payer: Self-pay

## 2019-01-17 DIAGNOSIS — Z96642 Presence of left artificial hip joint: Secondary | ICD-10-CM

## 2019-01-17 DIAGNOSIS — M25551 Pain in right hip: Principal | ICD-10-CM

## 2019-01-17 DIAGNOSIS — M25552 Pain in left hip: Principal | ICD-10-CM

## 2019-01-17 DIAGNOSIS — G8929 Other chronic pain: Secondary | ICD-10-CM

## 2019-01-17 MED ORDER — METHYLPREDNISOLONE 4 MG PO TBPK: ORAL_TABLET | ORAL | 0 refills | Status: AC

## 2019-01-17 MED ORDER — OXYBUTYNIN CHLORIDE ER 5 MG PO TB24: 5.0000 mg | ORAL_TABLET | Freq: Every day | ORAL | 0 refills | Status: DC

## 2019-01-17 NOTE — Telephone Encounter (Signed)
Spoke with Dr. Laban Emperor. Will send a script for Medrol Dosepak. Patient notified.

## 2019-01-17 NOTE — Telephone Encounter (Signed)
Pain in lower back, middle. Goes into right hip.

## 2019-01-17 NOTE — Telephone Encounter (Signed)
She put off her procedure last month and called now to schedule. I told her we were not doing procedures due to Covid-19. She wants to know what the doctor can do for her in the meanwhile to help with her pain.

## 2019-01-19 ENCOUNTER — Other Ambulatory Visit: Payer: Medicare Other

## 2019-01-19 ENCOUNTER — Telehealth: Payer: Self-pay

## 2019-01-19 NOTE — Telephone Encounter (Signed)
Received faxed PA form from [facility] for lidocaine 5% patch.  Placed form in Dr. Timoteo Expose box.

## 2019-01-20 NOTE — Telephone Encounter (Signed)
Filled and in Lisa's box 

## 2019-01-20 NOTE — Telephone Encounter (Signed)
Faxed form.

## 2019-01-21 ENCOUNTER — Other Ambulatory Visit: Payer: Self-pay

## 2019-01-21 ENCOUNTER — Ambulatory Visit (INDEPENDENT_AMBULATORY_CARE_PROVIDER_SITE_OTHER): Payer: Medicare Other | Admitting: Family Medicine

## 2019-01-21 ENCOUNTER — Encounter: Payer: Self-pay | Admitting: Family Medicine

## 2019-01-21 VITALS — BP 193/96 | HR 66 | Ht 63.5 in | Wt 347.0 lb

## 2019-01-21 DIAGNOSIS — E039 Hypothyroidism, unspecified: Secondary | ICD-10-CM | POA: Diagnosis not present

## 2019-01-21 DIAGNOSIS — E2839 Other primary ovarian failure: Secondary | ICD-10-CM

## 2019-01-21 DIAGNOSIS — G894 Chronic pain syndrome: Secondary | ICD-10-CM | POA: Diagnosis not present

## 2019-01-21 DIAGNOSIS — Z6841 Body Mass Index (BMI) 40.0 and over, adult: Secondary | ICD-10-CM

## 2019-01-21 DIAGNOSIS — E7800 Pure hypercholesterolemia, unspecified: Secondary | ICD-10-CM

## 2019-01-21 DIAGNOSIS — R32 Unspecified urinary incontinence: Secondary | ICD-10-CM | POA: Diagnosis not present

## 2019-01-21 DIAGNOSIS — E1142 Type 2 diabetes mellitus with diabetic polyneuropathy: Secondary | ICD-10-CM

## 2019-01-21 DIAGNOSIS — F3170 Bipolar disorder, currently in remission, most recent episode unspecified: Secondary | ICD-10-CM

## 2019-01-21 DIAGNOSIS — Z1239 Encounter for other screening for malignant neoplasm of breast: Secondary | ICD-10-CM | POA: Diagnosis not present

## 2019-01-21 DIAGNOSIS — K219 Gastro-esophageal reflux disease without esophagitis: Secondary | ICD-10-CM | POA: Diagnosis not present

## 2019-01-21 DIAGNOSIS — I10 Essential (primary) hypertension: Secondary | ICD-10-CM

## 2019-01-21 DIAGNOSIS — E559 Vitamin D deficiency, unspecified: Secondary | ICD-10-CM

## 2019-01-21 DIAGNOSIS — J432 Centrilobular emphysema: Secondary | ICD-10-CM

## 2019-01-21 DIAGNOSIS — Z7189 Other specified counseling: Secondary | ICD-10-CM

## 2019-01-21 DIAGNOSIS — R3915 Urgency of urination: Secondary | ICD-10-CM

## 2019-01-21 DIAGNOSIS — I700 Atherosclerosis of aorta: Secondary | ICD-10-CM

## 2019-01-21 MED ORDER — SIMVASTATIN 40 MG PO TABS: 40.0000 mg | ORAL_TABLET | Freq: Every evening | ORAL | 3 refills | Status: DC

## 2019-01-21 MED ORDER — PANTOPRAZOLE SODIUM 40 MG PO TBEC: 40.0000 mg | DELAYED_RELEASE_TABLET | Freq: Every day | ORAL | 3 refills | Status: DC

## 2019-01-21 MED ORDER — LOSARTAN POTASSIUM 100 MG PO TABS: 100.0000 mg | ORAL_TABLET | Freq: Every day | ORAL | 1 refills | Status: DC

## 2019-01-21 MED ORDER — LEVOTHYROXINE SODIUM 150 MCG PO TABS: 150.0000 ug | ORAL_TABLET | Freq: Every day | ORAL | 3 refills | Status: DC

## 2019-01-21 NOTE — Patient Instructions (Addendum)
Increase losartan to 100mg  daily. Keep watching blood pressures at home and let me know if consistently >140/90.  If interested, check with pharmacy about new 2 shot shingles series (shingrix).  Work on diabetic diet. We may consider starting metformin back up in the future.  Let me know how oxybutynin is helping - we have option to increase dose.  Call us to schedule 3 month follow up visit.

## 2019-01-21 NOTE — Assessment & Plan Note (Addendum)
Continues daily PPI.  

## 2019-01-21 NOTE — Assessment & Plan Note (Signed)
Advanced directive: scanned 05/2017. Husband Melvyn Neth is HCPOA. Desires very limited CPR attempts, does not want any prolonged life suppory

## 2019-01-21 NOTE — Assessment & Plan Note (Signed)
See above - doing better on oxybutynin XL 5mg  nightly.

## 2019-01-21 NOTE — Assessment & Plan Note (Signed)
Weight gain noted. Unable to afford victoza. Will renew efforts at diabetic diet.

## 2019-01-21 NOTE — Assessment & Plan Note (Signed)
Currently on medrol dosepak by PM&R. Awaiting insurance coverage decision for lidocaine patches. Discussed possibly trying TCA or gabapentin in the future.

## 2019-01-21 NOTE — Assessment & Plan Note (Signed)
Stable period on advair.

## 2019-01-21 NOTE — Assessment & Plan Note (Signed)
Recent UA normal. Trial oxybutynin XL 5mg  with benefit - discussed option to increase dose. Will monitor for now.

## 2019-01-21 NOTE — Assessment & Plan Note (Signed)
Chronic, stable. Continue simvastatin.  The 10-year ASCVD risk score Denman George DC Montez Hageman., et al., 2013) is: 26%   Values used to calculate the score:     Age: 68 years     Sex: Female     Is Non-Hispanic African American: No     Diabetic: Yes     Tobacco smoker: No     Systolic Blood Pressure: 193 mmHg     Is BP treated: Yes     HDL Cholesterol: 83.4 mg/dL     Total Cholesterol: 170 mg/dL

## 2019-01-21 NOTE — Assessment & Plan Note (Signed)
States victoza unaffordable - has not been taking. Discussed starting metformin - but A1c remains well controlled. Will renew efforts at following diabetic diet.

## 2019-01-21 NOTE — Assessment & Plan Note (Addendum)
Chronic, stable on trileptal and lamictal. Followed by psych.

## 2019-01-21 NOTE — Assessment & Plan Note (Signed)
Vit D levels normal on latest check.

## 2019-01-21 NOTE — Assessment & Plan Note (Addendum)
Chronic, uncontrolled. Will increase losartan to 100mg  daily. Advised to monitor BP regularly and call us in 1 wk with updated readings.

## 2019-01-21 NOTE — Assessment & Plan Note (Signed)
Chronic, stable. Continue current regimen. 

## 2019-01-21 NOTE — Progress Notes (Signed)
Virtual visit completed through [employer]. Due to national recommendations of social distancing due to COVID 19, a virtual visit is felt to be most appropriate for this patient at this time.   Patient location: home Provider location: [place] at [facility], office If any vitals were documented, they were collected by patient at home unless specified below.    BP (!) 193/96 (BP Location: Right Arm, Patient Position: Sitting, Cuff Size: Large)   Pulse 66   Ht 5' 3.5" (1.613 m)   Wt (!) 347 lb (157.4 kg)   BMI 60.50 kg/m    CC: AMW f/u visit Subjective:    Patient ID: Amy Barnes, female    DOB: April 10, 1951, 68 y.o.   MRN: [id number]  HPI: Amy Barnes is a 68 y.o. female presenting on 01/21/2019 for Annual Exam (Pt 2. )   Saw Virl Axe last week for medicare wellness visit. Note reviewed.    See prior note for details.  BP markedly elevated today despite losartan 50mg  - takes lasix 40mg  PRN at night.   Urinary urgency with increasing urinary accidents and incomplete emptying while urinating - UA normal last visit. We started oxybutynin XL 5mg  nightly for urinary symptoms.   Chronic back pain increasing - last visit we started lidocaine patches. PM&R also started her on medrol dosepak.  DM - not taking victoza due to cost. Does regularly check sugars - 135 fasting this morning.   Preventative: COLONOSCOPY WITH PROPOFOL 12/31/2015;diverticulosis, int hem, o/w normal rpt 10 yrs ([facility]).  Lung cancer screening -ex smoker, quit 2010. ~40 PY hx. undergoing LRCT scans.  Breast cancer screening -overdue, agrees to reschedule at [facility]. Doesn't do breast exams at home.last was ~15 yrs ago  Well woman exam -h/o partial hysterectomy for menorrhagia, ovaries remain. No pelvic pain/pressure or bleeding. DEXA scan -not done previously. Will order.  Flu shot -yearly Td-2008 Pneumovax 2013, prevnar 2019. Due for final pneumovax  zostavax -2015  shingrix - discussed  Advanced  directive: scanned 05/2017. Husband Melvyn Neth is HCPOA. Desires very limited CPR attempts, does not want any prolonged life suppory Seat belt use discussed Sunscreen usediscussed. No changing moles on skin.  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 Interim medical history since our last visit reviewed. Allergies and medications reviewed and updated. Outpatient Medications Prior to Visit  Medication Sig Dispense Refill  . albuterol (PROVENTIL HFA;VENTOLIN HFA) 108 (90 Base) MCG/ACT inhaler Inhale 2 puffs into the lungs every 6 (six) hours as needed for wheezing or shortness of breath. 1 Inhaler 6  . aspirin 81 MG EC tablet Take 81 mg by mouth at bedtime.     . celecoxib (CELEBREX) 200 MG capsule Take 1 capsule (200 mg total) by mouth daily. 90 capsule 1  . cyclobenzaprine (FLEXERIL) 10 MG tablet Take 1 tablet (10 mg total) by mouth 3 (three) times daily as needed for muscle spasms. 50 tablet 1  . doxycycline (VIBRA-TABS) 100 MG tablet Take 1 tablet (100 mg total) by mouth at bedtime. Reported on 12/31/2015 90 tablet 1  . Fluticasone-Salmeterol (ADVAIR) 250-50 MCG/DOSE AEPB Inhale 1 puff into the lungs 2 (two) times daily.    . furosemide (LASIX) 40 MG tablet Take 1 tablet (40 mg total)  by mouth daily as needed.    . lamoTRIgine (LAMICTAL) 200 MG tablet Take 200 mg by mouth daily.      . methylPREDNISolone (MEDROL) 4 MG TBPK tablet Follow package instructions. 21 tablet 0  . montelukast (SINGULAIR) 10 MG tablet TAKE 1 TABLET BY MOUTH EVERYDAY AT BEDTIME 90 tablet 3  . Multiple Vitamin (MULTIVITAMIN WITH MINERALS) TABS tablet Take 2 tablets by mouth daily.     . ondansetron (ZOFRAN) 8 MG tablet Take 8 mg by mouth every 8 (eight)  hours as needed for nausea or vomiting.     . Oxcarbazepine (TRILEPTAL) 300 MG tablet Take 300-600 mg by mouth 2 (two) times daily. Pt takes one tablet in the morning and two in the evening.    Marland Kitchen. oxybutynin (DITROPAN-XL) 5 MG 24 hr tablet Take 1 tablet (5 mg total) by mouth at bedtime. 30 tablet 0  . oxyCODONE-acetaminophen (PERCOCET) 5-325 MG tablet 1-2 tabs po q6 hours prn pain 20 tablet 0  . promethazine (PHENERGAN) 25 MG tablet Take 25 mg by mouth every 6 (six) hours as needed for nausea or vomiting.    Marland Kitchen. levothyroxine (SYNTHROID, LEVOTHROID) 150 MCG tablet Take 1 tablet (150 mcg total) by mouth at bedtime. 90 tablet 3  . liraglutide (VICTOZA) 18 MG/3ML SOPN Inject 0.3 mLs (1.8 mg total) into the skin at bedtime. 27 mL 3  . losartan (COZAAR) 100 MG tablet TAKE ONE-HALF TAB DAILY 45 tablet 0  . pantoprazole (PROTONIX) 40 MG tablet Take 1 tablet (40 mg total) by mouth daily. 90 tablet 3  . simvastatin (ZOCOR) 40 MG tablet TAKE 1 TABLET BY MOUTH EVERY DAY IN THE EVENING 90 tablet 0  . zaleplon (SONATA) 10 MG capsule Take 10 mg by mouth at bedtime as needed for sleep. Reported on 04/17/2016    . lidocaine (LIDODERM) 5 % Place 1 patch onto the skin daily. Remove & Discard patch within 12 hours or as directed by MD (Patient not taking: Reported on 01/21/2019) 30 patch 0   No facility-administered medications prior to visit.      Per HPI unless specifically indicated in ROS section below Review of Systems Objective:    BP (!) 193/96 (BP Location: Right Arm, Patient Position: Sitting, Cuff Size: Large)   Pulse 66   Ht 5' 3.5" (1.613 m)   Wt (!) 347 lb (157.4 kg)   BMI 60.50 kg/m   Wt Readings from Last 3 Encounters:  01/21/19 (!) 347 lb (157.4 kg)  11/29/18 (!) 320 lb (145.2 kg)  11/09/18 (!) 320 lb (145.2 kg)     Physical exam: Gen: alert, NAD, not ill appearing Pulm: speaks in complete sentences without increased work of breathing Psych: normal mood, normal thought content       Results for orders placed or performed in visit on 01/14/19  VITAMIN D 25 Hydroxy (Vit-D Deficiency, Fractures)  Result Value Ref Range   VITD 38.18 30.00 - 100.00 ng/mL  CBC with Differential/Platelet  Result Value Ref Range   WBC 5.8 4.0 - 10.5 K/uL   RBC 4.31 3.87 - 5.11 Mil/uL   Hemoglobin 12.7 12.0 - 15.0 g/dL   HCT 40.937.9 81.136.0 - 91.446.0 %   MCV 87.9 78.0 - 100.0 fl   MCHC 33.5 30.0 - 36.0 g/dL   RDW 78.215.6 (H) 95.611.5 - 21.315.5 %   Platelets 234.0 150.0 - 400.0 K/uL   Neutrophils Relative % 64.8 43.0 - 77.0 %   Lymphocytes Relative 22.5 12.0 - 46.0 %  Monocytes Relative 9.3 3.0 - 12.0 %   Eosinophils Relative 2.9 0.0 - 5.0 %   Basophils Relative 0.5 0.0 - 3.0 %   Neutro Abs 3.8 1.4 - 7.7 K/uL   Lymphs Abs 1.3 0.7 - 4.0 K/uL   Monocytes Absolute 0.5 0.1 - 1.0 K/uL   Eosinophils Absolute 0.2 0.0 - 0.7 K/uL   Basophils Absolute 0.0 0.0 - 0.1 K/uL  Hemoglobin A1c  Result Value Ref Range   Hgb A1c MFr Bld 6.3 4.6 - 6.5 %  TSH  Result Value Ref Range   TSH 1.86 0.35 - 4.50 uIU/mL  Lipid panel  Result Value Ref Range   Cholesterol 170 0 - 200 mg/dL   Triglycerides 69.6 0.0 - 149.0 mg/dL   HDL 29.52 >84.13 mg/dL   VLDL 24.4 0.0 - 01.0 mg/dL   LDL Cholesterol 74 0 - 99 mg/dL   Total CHOL/HDL Ratio 2    NonHDL 86.75   Comprehensive metabolic panel  Result Value Ref Range   Sodium 139 135 - 145 mEq/L   Potassium 4.1 3.5 - 5.1 mEq/L   Chloride 99 96 - 112 mEq/L   CO2 31 19 - 32 mEq/L   Glucose, Bld 158 (H) 70 - 99 mg/dL   BUN 25 (H) 6 - 23 mg/dL   Creatinine, Ser 2.72 0.40 - 1.20 mg/dL   Total Bilirubin 0.5 0.2 - 1.2 mg/dL   Alkaline Phosphatase 64 39 - 117 U/L   AST 19 0 - 37 U/L   ALT 22 0 - 35 U/L   Total Protein 7.0 6.0 - 8.3 g/dL   Albumin 4.3 3.5 - 5.2 g/dL   Calcium 9.6 8.4 - 53.6 mg/dL   GFR 64.40 >34.74 mL/min  POCT Urinalysis Dipstick (Automated)  Result Value Ref Range   Color, UA yellow    Clarity, UA clear    Glucose, UA Negative Negative   Bilirubin, UA  negative    Ketones, UA negative    Spec Grav, UA 1.015 1.010 - 1.025   Blood, UA negative    pH, UA 7.0 5.0 - 8.0   Protein, UA Negative Negative   Urobilinogen, UA 0.2 0.2 or 1.0 E.U./dL   Nitrite, UA negative    Leukocytes, UA Negative Negative   Assessment & Plan:   Problem List Items Addressed This Visit    Vitamin D insufficiency    Vit D levels normal on latest check.       Urinary urgency    See above - doing better on oxybutynin XL  nightly.       Urinary incontinence (Chronic)    Recent UA normal. Trial oxybutynin XL  with benefit - discussed option to increase dose. Will monitor for now.       Thoracic aortic atherosclerosis (HCC)   Relevant Medications   losartan (COZAAR) 100 MG tablet   simvastatin (ZOCOR) 40 MG tablet   Morbid obesity with BMI of 50.0-59.9, adult (HCC)    Weight gain noted. Unable to afford victoza. Will renew efforts at diabetic diet.       Hypothyroidism    Chronic, stable. Continue current regimen.       Relevant Medications   levothyroxine (SYNTHROID) 150 MCG tablet   Hypercholesterolemia    Chronic, stable. Continue simvastatin.  The 10-year ASCVD risk score Denman George DC Jr., et al., 2013) is: 26%   Values used to calculate the score:     Age: 45 years     Sex: Female  Is Non-Hispanic African American: No     Diabetic: Yes     Tobacco smoker: No     Systolic Blood Pressure: 193 mmHg     Is BP treated: Yes     HDL Cholesterol: 83.4 mg/dL     Total Cholesterol: 170 mg/dL       Relevant Medications   losartan (COZAAR) 100 MG tablet   simvastatin (ZOCOR) 40 MG tablet   GERD    Continues daily PPI      Relevant Medications   pantoprazole (PROTONIX) 40 MG tablet   Essential hypertension    Chronic, uncontrolled. Will increase losartan to 100mg  daily. Advised to monitor BP regularly and call us in 1 wk with updated readings.      Relevant Medications   losartan (COZAAR) 100 MG tablet   simvastatin (ZOCOR) 40 MG  tablet   DM type 2 with diabetic peripheral neuropathy (HCC)    States victoza unaffordable - has not been taking. Discussed starting metformin - but A1c remains well controlled. Will renew efforts at following diabetic diet.       Relevant Medications   losartan (COZAAR) 100 MG tablet   simvastatin (ZOCOR) 40 MG tablet   Chronic pain syndrome (Chronic)    Currently on medrol dosepak by PM&R. Awaiting insurance coverage decision for lidocaine patches. Discussed possibly trying TCA or gabapentin in the future.       Centrilobular emphysema (HCC)    Stable period on advair.       Bipolar disorder (HCC)    Chronic, stable on trileptal and lamictal. Followed by psych.       Advanced care planning/counseling discussion - Primary    Advanced directive: scanned 05/2017. Husband Melvyn Neth is HCPOA. Desires very limited CPR attempts, does not want any prolonged life suppory       Other Visit Diagnoses    Breast cancer screening       Relevant Orders   MM Digital Screening   Estrogen deficiency       Relevant Orders   DG BONE DENSITY (DXA)       Meds ordered this encounter  Medications  . losartan (COZAAR) 100 MG tablet    Sig: Take 1 tablet (100 mg total) by mouth daily.    Dispense:  90 tablet    Refill:  1  . pantoprazole (PROTONIX) 40 MG tablet    Sig: Take 1 tablet (40 mg total) by mouth daily.    Dispense:  90 tablet    Refill:  3  . levothyroxine (SYNTHROID) 150 MCG tablet    Sig: Take 1 tablet (150 mcg total) by mouth at bedtime.    Dispense:  90 tablet    Refill:  3  . simvastatin (ZOCOR) 40 MG tablet    Sig: Take 1 tablet (40 mg total) by mouth every evening.    Dispense:  90 tablet    Refill:  3   Orders Placed This Encounter  Procedures  . MM Digital Screening    Standing Status:   Future    Standing Expiration Date:   03/22/2020    Order Specific Question:   Reason for Exam (SYMPTOM  OR DIAGNOSIS REQUIRED)    Answer:   breast cancer screening    Order  Specific Question:   Preferred imaging location?    Answer:   [facility]  . DG BONE DENSITY (DXA)    Standing Status:   Future    Standing Expiration Date:   03/22/2020  Order Specific Question:   Reason for Exam (SYMPTOM  OR DIAGNOSIS REQUIRED)    Answer:   OP screening    Order Specific Question:   Preferred imaging location?    Answer:   [facility]    Patient Instructions  Increase losartan to  daily. Keep watching blood pressures at home and let me know if consistently >140/90.  If interested, check with pharmacy about new 2 shot shingles series (shingrix).  Work on diabetic diet. We may consider starting metformin back up in the future.  Let me know how oxybutynin is helping - we have option to increase dose.  Call us to schedule 3 month follow up visit.    Follow up plan: Return in about 3 months (around 04/22/2019) for follow up visit.  [name], MD

## 2019-01-24 NOTE — Telephone Encounter (Addendum)
Received faxed PA denial stating this drug is approved if using for pain due to diabetic neuropathy, pain due to cancer or post-herpetic neuralgia. Fyi to Dr. Reece Agar.

## 2019-01-24 NOTE — Telephone Encounter (Signed)
Spoke with pt notifying her lidocaine was denied. Pt verbalizes understanding.  States her BP is lowering on the higher losartan.

## 2019-01-24 NOTE — Telephone Encounter (Addendum)
plz notify pt this was denied. Continue current pain regimen as up to now.  Are blood pressure readings improving on higher losartan dose?

## 2019-01-25 ENCOUNTER — Ambulatory Visit: Payer: Medicare Other | Admitting: Family Medicine

## 2019-01-25 ENCOUNTER — Other Ambulatory Visit: Payer: Self-pay | Admitting: Family Medicine

## 2019-02-08 ENCOUNTER — Other Ambulatory Visit: Payer: Self-pay | Admitting: Family Medicine

## 2019-02-08 DIAGNOSIS — S32009K Unspecified fracture of unspecified lumbar vertebra, subsequent encounter for fracture with nonunion: Secondary | ICD-10-CM | POA: Diagnosis not present

## 2019-02-08 DIAGNOSIS — M4316 Spondylolisthesis, lumbar region: Secondary | ICD-10-CM | POA: Diagnosis not present

## 2019-02-11 ENCOUNTER — Ambulatory Visit: Payer: Self-pay | Admitting: Urology

## 2019-02-14 ENCOUNTER — Other Ambulatory Visit: Payer: Self-pay | Admitting: Neurosurgery

## 2019-02-22 ENCOUNTER — Encounter: Payer: Self-pay | Admitting: Family Medicine

## 2019-02-22 ENCOUNTER — Ambulatory Visit (INDEPENDENT_AMBULATORY_CARE_PROVIDER_SITE_OTHER): Payer: Medicare Other | Admitting: Family Medicine

## 2019-02-22 ENCOUNTER — Other Ambulatory Visit: Payer: Self-pay

## 2019-02-22 VITALS — BP 143/97 | HR 72 | Wt 367.0 lb

## 2019-02-22 DIAGNOSIS — L909 Atrophic disorder of skin, unspecified: Secondary | ICD-10-CM

## 2019-02-22 DIAGNOSIS — R238 Other skin changes: Secondary | ICD-10-CM

## 2019-02-22 DIAGNOSIS — B354 Tinea corporis: Secondary | ICD-10-CM | POA: Diagnosis not present

## 2019-02-22 MED ORDER — NYSTATIN 100000 UNIT/GM EX POWD: Freq: Four times a day (QID) | CUTANEOUS | 3 refills | Status: DC

## 2019-02-22 NOTE — Progress Notes (Signed)
   Subjective:    Patient ID: Amy Barnes is a 68 y.o. 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 On antibiotics x 6 years, leads to frequent yeast infections. Notes growth x a few months with rawness in vaginal area, that comes and goes x several months. Takes Diflucan and repeats in 72 hours but not helping. She reports ongoing urge incontinence. She is s/p partial hyst for bleeding. Deos not need pap smears.  Review of Systems  Constitutional: Negative for chills and fever.  Respiratory: Negative for shortness of breath.   Cardiovascular: Negative for chest pain.  Gastrointestinal: Negative for abdominal pain, nausea and vomiting.  Genitourinary: Negative for dysuria.  Skin: Negative for rash.      Objective:    BP (!) 143/97   Pulse 72   Wt (!) 367 lb (166.5 kg)   BMI 63.99 kg/m  Physical Exam Constitutional:      General: She is not in acute distress.    Appearance: She is well-developed. She is obese.  HENT:     Head: Normocephalic and atraumatic.  Eyes:     General: No scleral icterus. Neck:     Musculoskeletal: Neck supple.  Cardiovascular:     Rate and Rhythm: Normal rate.  Pulmonary:     Effort: Pulmonary effort is normal.  Abdominal:     Palpations: Abdomen is soft.  Genitourinary:    General: Normal vulva.     Comments: Atrophic vagina, no lesions noted and a possible healed small sub-centimeter area noted, without any raised portion or ulceration noted. Skin:    General: Skin is warm and dry.  Neurological:     Mental Status: She is alert and oriented to person, place, and time.         Assessment & Plan:   Problem List Items Addressed This Visit    None    Visit Diagnoses    Tinea corporis    -  Primary   will try to hold on Diflucan--switch to Nystatin   Relevant Medications   nystatin  (MYCOSTATIN/NYSTOP) powder   Skin breakdown       suspect moisture from incontinence + chronic Abx, lead to breakdown +/- yeast. Trial of Nystatin powder to aid in decreasing moisture, skin protectant (Aquafor)      Total face-to-face time with patient: 20 minutes. Over 50% of encounter was spent on counseling and coordination of care. Return in about 4 weeks (around 03/22/2019).  Reva Bores 02/22/2019 11:01 AM

## 2019-02-22 NOTE — Progress Notes (Signed)
Patient reports having a bump on her vagina on/off for several months. 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

## 2019-02-27 ENCOUNTER — Other Ambulatory Visit: Payer: Self-pay | Admitting: Family Medicine

## 2019-02-28 HISTORY — PX: LUMBAR FUSION: SHX111

## 2019-03-02 ENCOUNTER — Encounter: Payer: Medicare Other | Attending: Family Medicine | Admitting: *Deleted

## 2019-03-02 ENCOUNTER — Other Ambulatory Visit: Payer: Self-pay

## 2019-03-02 ENCOUNTER — Encounter: Payer: Self-pay | Admitting: *Deleted

## 2019-03-02 VITALS — BP 140/80 | Ht 64.0 in | Wt 362.3 lb

## 2019-03-02 DIAGNOSIS — G8929 Other chronic pain: Secondary | ICD-10-CM | POA: Insufficient documentation

## 2019-03-02 DIAGNOSIS — Z713 Dietary counseling and surveillance: Secondary | ICD-10-CM | POA: Insufficient documentation

## 2019-03-02 DIAGNOSIS — Z6841 Body Mass Index (BMI) 40.0 and over, adult: Secondary | ICD-10-CM | POA: Insufficient documentation

## 2019-03-02 DIAGNOSIS — M545 Low back pain: Secondary | ICD-10-CM | POA: Diagnosis not present

## 2019-03-02 DIAGNOSIS — E119 Type 2 diabetes mellitus without complications: Secondary | ICD-10-CM | POA: Diagnosis not present

## 2019-03-02 NOTE — Progress Notes (Signed)
Diabetes Self-Management Education  Visit Type: First/Initial  Appt. Start Time: 0830 Appt. End Time: 0930  03/02/2019  [name], identified by name and date of birth, is a 68 y.o. female with a diagnosis of Diabetes: Type 2.   ASSESSMENT  Blood pressure 140/80, height 5\' 4"  (1.626 m), weight (!) 362 lb 4.8 oz (164.3 kg). Body mass index is 62.19 kg/m.  Diabetes Self-Management Education - 03/02/19 1034      Visit Information   Visit Type  First/Initial      Initial Visit   Diabetes Type  Type 2    Are you currently following a meal plan?  Yes    What type of meal plan do you follow?  Keto - only 20 grams carbohydrates per day    Are you taking your medications as prescribed?  Yes    Date Diagnosed  15 years ago      Health Coping   How would you rate your overall health?  Good      Psychosocial Assessment   Patient Belief/Attitude about Diabetes  Other (comment)   "watchful"   Self-care barriers  Unsteady gait/risk for falls    Self-management support  Doctor's office;Family    Patient Concerns  Nutrition/Meal planning;Glycemic Control;Weight Control    Special Needs  None    Preferred Learning Style  Visual    Learning Readiness  Change in progress    How often do you need to have someone help you when you read instructions, pamphlets, or other written materials from your doctor or pharmacy?  1 - Never    What is the last grade level you completed in school?  12th      Pre-Education Assessment   Patient understands the diabetes disease and treatment process.  Needs Review    Patient understands incorporating nutritional management into lifestyle.  Needs Instruction    Patient undertands incorporating physical activity into lifestyle.  Needs Review    Patient understands using medications safely.  Needs Review    Patient understands monitoring blood glucose, interpreting and using results  Needs Review    Patient understands prevention, detection, and treatment  of acute complications.  Needs Instruction    Patient understands prevention, detection, and treatment of chronic complications.  Needs Review    Patient understands how to develop strategies to address psychosocial issues.  Needs Review    Patient understands how to develop strategies to promote health/change behavior.  Needs Review      Complications   Last HgB A1C per patient/outside source  6.3 %   01/14/2019   How often do you check your blood sugar?  1-2 times/day    Fasting Blood glucose range (mg/dL)  96-759;163-846   Pt reports fasting blood sugars 110-135 mg/dL.    Have you had a dilated eye exam in the past 12 months?  No    Have you had a dental exam in the past 12 months?  Yes    Are you checking your feet?  No      Dietary Intake   Breakfast  MCT oil, butter, cream, protein powder    Snack (morning)  0-1 snacks/day - cheese, boiled egg    Lunch  chicken wings, salad, left overs    Dinner  beef, chicken, pork, cheese, peanut butter, nuts, green beans, lettuce, tomatoes, carrots, cuccumber, broccoli, cauliflower, other non-starchy vegetables    Beverage(s)  water, black coffee      Exercise   Exercise Type  ADL's  Patient Education   Previous Diabetes Education  Yes (please comment)   when she lived in [place]   Disease state   Explored patient's options for treatment of their diabetes   has been on Metformin and Victoza in the past   Nutrition management   Role of diet in the treatment of diabetes and the relationship between the three main macronutrients and blood glucose level;Reviewed blood glucose goals for pre and post meals and how to evaluate the patients' food intake on their blood glucose level.    Physical activity and exercise   Role of exercise on diabetes management, blood pressure control and cardiac health.    Monitoring  Purpose and frequency of SMBG.;Taught/discussed recording of test results and interpretation of SMBG.;Identified appropriate SMBG  and/or A1C goals.    Chronic complications  Relationship between chronic complications and blood glucose control;Retinopathy and reason for yearly dilated eye exams    Psychosocial adjustment  Identified and addressed patients feelings and concerns about diabetes      Individualized Goals (developed by patient)   Reducing Risk  Improve blood sugars Lose weight Become more fit     Outcomes   Expected Outcomes  Demonstrated interest in learning. Expect positive outcomes    Program Status  Not Completed       Individualized Plan for Diabetes Self-Management Training:   Learning Objective:  Patient will have a greater understanding of diabetes self-management. Patient education plan is to attend individual and/or group sessions per assessed needs and concerns.   Plan:   Patient Instructions  Check blood sugars before breakfast or 2 hrs after one meal 3-4 x week Exercise:  Walk as tolerated Eat 3 meals day,   1-2  snacks a day Space meals 4-6 hours apart Make an eye doctor appointment Call back if you want to schedule another appointment with the nurse or dietitian  Expected Outcomes:  Demonstrated interest in learning. Expect positive outcomes  Education material provided:  General Meal Planning Guidelines Simple Meal Plan  If problems or questions, patient to contact team via:   Sharion Settler, RN, CCM, CDE [phone]  Future DSME appointment: No follow up scheduled at this time.

## 2019-03-02 NOTE — Patient Instructions (Signed)
Check blood sugars before breakfast or 2 hrs after one meal 3-4 x week  Exercise:  Walk as tolerated  Eat 3 meals day,   1-2  snacks a day Space meals 4-6 hours apart  Make an eye doctor appointment  Call back if you want to schedule another appointment with the nurse or dietitian

## 2019-03-03 ENCOUNTER — Other Ambulatory Visit: Payer: Self-pay | Admitting: Neurosurgery

## 2019-03-04 ENCOUNTER — Other Ambulatory Visit: Payer: Self-pay

## 2019-03-04 ENCOUNTER — Other Ambulatory Visit: Payer: Self-pay | Admitting: Family Medicine

## 2019-03-04 ENCOUNTER — Encounter: Payer: Self-pay | Admitting: Urology

## 2019-03-04 ENCOUNTER — Ambulatory Visit (INDEPENDENT_AMBULATORY_CARE_PROVIDER_SITE_OTHER): Payer: Medicare Other | Admitting: Urology

## 2019-03-04 VITALS — BP 163/93 | HR 72 | Ht 64.0 in | Wt 362.0 lb

## 2019-03-04 DIAGNOSIS — R3915 Urgency of urination: Secondary | ICD-10-CM | POA: Diagnosis not present

## 2019-03-04 DIAGNOSIS — N3941 Urge incontinence: Secondary | ICD-10-CM

## 2019-03-04 LAB — URINALYSIS, COMPLETE
Bilirubin, UA: NEGATIVE
Glucose, UA: NEGATIVE
Ketones, UA: NEGATIVE
Nitrite, UA: NEGATIVE
Protein,UA: NEGATIVE
RBC, UA: NEGATIVE
Specific Gravity, UA: 1.02 (ref 1.005–1.030)
Urobilinogen, Ur: 0.2 mg/dL (ref 0.2–1.0)
pH, UA: 7 (ref 5.0–7.5)

## 2019-03-04 LAB — MICROSCOPIC EXAMINATION
Bacteria, UA: NONE SEEN
RBC, Urine: NONE SEEN /hpf (ref 0–2)

## 2019-03-04 MED ORDER — OXYBUTYNIN CHLORIDE ER 5 MG PO TB24: 5.0000 mg | ORAL_TABLET | Freq: Two times a day (BID) | ORAL | 3 refills | Status: DC

## 2019-03-04 NOTE — Progress Notes (Signed)
03/04/2019 8:44 AM   Amy Barnes 25-Aug-1951 [id number]  Referring provider: [name], MD [address] [place], [place] [address]  No chief complaint on file.   HPI: Amy Barnes was referred today for incontinence.  She feels like she is having a "bladder spasm".  She will get a sudden urge to void and then when she stands she will lose her whole bladder.  Sometimes she does not feel like she has a good stream or is empty.  She has not had any dysuria or gross hematuria.  She started oxybutynin 5 mg XL and has not seen a lot of change.  Her symptoms were worse after a back injection a few months ago.  Cath specimen today obtained 125 cc, she has not voided in a couple of hours.  Her pad is dry.  She is drinking plenty of water.  She drinks three 32 ounce bottles a day.  She has no bulge symptoms.  3 normal spontaneous vaginal deliveries.  She has neurogenic and nonneurogenic factors that might affect her voiding.  Her morbid obesity affects her mobility.  She also has significant lumbar degenerative disc disease and compression at L4.  Neurosurgery may be planning back surgery and general surgery may be planning bypass.  She did undergo CT scan of the abdomen and pelvis in 2017 and I reviewed those images.  They were benign and her bladder was not distended.  Her recent creatinine was 0.66.  Modifying factors: There are no other modifying factors  Associated signs and symptoms: There are no other associated signs and symptoms Aggravating and relieving factors: There are no other aggravating or relieving factors Severity: Moderate Duration: Persistent  Amy Barnes was chaperone and present for the entire history and physical.   PMH: Past Medical History:  Diagnosis Date  . Allergic rhinitis   . Anemia   . Anxiety   . Asthma    seasonal  . Bipolar affective disorder (HCC)    takes Synthroid meds for Bipolar  . CAD (coronary artery disease) 07/2016   by CT scan  . Carpal  tunnel syndrome   . Cataract   . Centrilobular emphysema (HCC) 07/2016   by CT scan - pt not aware of this  . Constipation due to pain medication   . Depression with anxiety   . Diabetes mellitus   . GERD (gastroesophageal reflux disease)   . 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 Hypertension   . OSA (obstructive sleep apnea)    no longer using cpap, uses a bed that raises and lowers hob  . Osteoarthritis   . Osteoarthritis   . Pneumonia   . Restless legs   . Septic arthritis (HCC) 10/11/2012  . Shingles 06/30/2016  . Shortness of breath   . Status post revision of total hip replacement bilateral   prosthetic infection R 2013, L 2015  . Thoracic aortic atherosclerosis (HCC) 11/207   by CT    Surgical History: Past Surgical History:  Procedure Laterality Date  . CARPAL TUNNEL RELEASE Right 05/15/2011  . CARPAL TUNNEL RELEASE Left 12/24/2017   Procedure: LEFT CARPAL TUNNEL RELEASE;  Surgeon: [name], MD;  Location: [facility];  Service: Orthopedics;  Laterality: Left;  . CERVICAL FUSION  2012   C2/3/4  . COLONOSCOPY WITH PROPOFOL N/A 12/31/2015   diverticulosis, int hem, o/w normal rpt 10 yrs ([facility])  . EYE SURGERY Bilateral    cataract  surgery with lens implant  . FOOT SURGERY  1982   bone spur  . I&D EXTREMITY  09/06/2012   Budd Palmer, MD; Right;  I&D of right thigh  . I&D EXTREMITY Left 07/2013   wound vac - daily doxycycline indefinitely  . KNEE ARTHROSCOPY  09/01/2012   Dannielle Huh, MD;  Right  . LUMBAR FUSION  01/08/2012   L3-4  . LUMBAR LAMINECTOMY/DECOMPRESSION MICRODISCECTOMY N/A 11/13/2016   LAMINOTOMY/LAMINECTOMY LUMBAR FOUR LUMBAR FIVE  WITH RESECTION OF SYNOVIAL CYST;  Surgeon: Tressie Stalker, MD  . NECK SURGERY     Herniated disk C2,3,4  . NOSE SURGERY    . PARTIAL HYSTERECTOMY  1984   for mennorhagia, ovaries remain  . REVISION TOTAL HIP ARTHROPLASTY Left 08/28/2011  . TMJ ARTHROPLASTY  1982  .  TOTAL HIP ARTHROPLASTY Left 2002  . TRIGGER FINGER RELEASE Right 05/15/2011   long finger    Home Medications:  Allergies as of 03/04/2019      Reactions   Cephalexin Hives   Hydrocodone Other (See Comments)   Reaction:  Hallucinations    Risperidone And Related Other (See Comments)   Reaction:  Made pt excessively sleepy   Seroquel [quetiapine Fumarate] Other (See Comments)   Reaction:  Made pt excessively sleepy   Sulfa Antibiotics Rash      Medication List       Accurate as of March 04, 2019  8:44 AM. If you have any questions, ask your nurse or doctor.        albuterol 108 (90 Base) MCG/ACT inhaler Commonly known as:  VENTOLIN HFA Inhale 2 puffs into the lungs every 6 (six) hours as needed for wheezing or shortness of breath.   aspirin 81 MG EC tablet Take 81 mg by mouth at bedtime.   celecoxib 200 MG capsule Commonly known as:  CELEBREX Take 1 capsule (200 mg total) by mouth daily.   cyclobenzaprine 10 MG tablet Commonly known as:  FLEXERIL Take 1 tablet (10 mg total) by mouth 3 (three) times daily as needed for muscle spasms.   doxycycline 100 MG tablet Commonly known as:  VIBRA-TABS TAKE 1 TABLET (100 MG TOTAL) BY MOUTH AT BEDTIME.   Fluticasone-Salmeterol 250-50 MCG/DOSE Aepb Commonly known as:  ADVAIR Inhale 1 puff into the lungs 2 (two) times daily.   furosemide 40 MG tablet Commonly known as:  LASIX Take 1 tablet (40 mg total) by mouth daily as needed.   lamoTRIgine 200 MG tablet Commonly known as:  LAMICTAL Take 200 mg by mouth daily.   levothyroxine 150 MCG tablet Commonly known as:  SYNTHROID Take 1 tablet (150 mcg total) by mouth at bedtime.   losartan 100 MG tablet Commonly known as:  COZAAR Take 1 tablet (100 mg total) by mouth daily.   montelukast 10 MG tablet Commonly known as:  SINGULAIR TAKE 1 TABLET BY MOUTH EVERYDAY AT BEDTIME   multivitamin with minerals Tabs tablet Take 2 tablets by mouth daily.   nystatin powder Commonly  known as:  MYCOSTATIN/NYSTOP Apply topically 4 (four) times daily.   ondansetron 8 MG tablet Commonly known as:  ZOFRAN Take 8 mg by mouth every 8 (eight) hours as needed for nausea or vomiting.   Oxcarbazepine 300 MG tablet Commonly known as:  TRILEPTAL Take 300-600 mg by mouth 2 (two) times daily. Pt takes one tablet in the morning and two in the evening.   oxybutynin 5 MG 24 hr tablet Commonly known as:  DITROPAN-XL TAKE 1 TABLET BY  MOUTH EVERYDAY AT BEDTIME   oxyCODONE-acetaminophen 5-325 MG tablet Commonly known as:  Percocet 1-2 tabs po q6 hours prn pain   pantoprazole 40 MG tablet Commonly known as:  PROTONIX Take 1 tablet (40 mg total) by mouth daily.   promethazine 25 MG tablet Commonly known as:  PHENERGAN Take 25 mg by mouth every 6 (six) hours as needed for nausea or vomiting.   simvastatin 40 MG tablet Commonly known as:  ZOCOR Take 1 tablet (40 mg total) by mouth every evening.   traMADol 50 MG tablet Commonly known as:  ULTRAM Take 50-100 mg by mouth every 6 (six) hours as needed.       Allergies:  Allergies  Allergen Reactions  . Cephalexin Hives  . Hydrocodone Other (See Comments)    Reaction:  Hallucinations   . Risperidone And Related Other (See Comments)    Reaction:  Made pt excessively sleepy  . Seroquel [Quetiapine Fumarate] Other (See Comments)    Reaction:  Made pt excessively sleepy  . Sulfa Antibiotics Rash    Family History: Family History  Problem Relation Age of Onset  . Aneurysm Father 62       brain  . Alcohol abuse Father   . Cancer Father        possibly  . CAD Other        several siblings  . Cancer Brother        prostate  . Diabetes Brother   . Diabetes Sister   . Anesthesia problems Neg Hx   . Hypotension Neg Hx   . Malignant hyperthermia Neg Hx   . Pseudochol deficiency Neg Hx     Social History:  reports that she quit smoking about 9 years ago. Her smoking use included cigarettes. She has a 60.00 pack-year  smoking history. She has never used smokeless tobacco. She reports current alcohol use of about 5.0 standard drinks of alcohol per week. She reports that she does not use drugs.  ROS:                                        Physical Exam: There were no vitals taken for this visit.  Constitutional:  Alert and oriented, No acute distress.  She had difficulty moving her legs because they are so heavy.  She had difficulty sitting up on the table. HEENT: [place] AT, moist mucus membranes.  Trachea midline, no masses. Cardiovascular: No clubbing, cyanosis, or edema. Respiratory: Normal respiratory effort, no increased work of breathing. GI: Abdomen is soft, nontender, nondistended, no abdominal masses, obese GU: No CVA tenderness Skin: No rashes, bruises or suspicious lesions. Neurologic: Grossly intact, no focal deficits, moving all 4 extremities. Psychiatric: Normal mood and affect. GU: The vulva appears normal without mass or lesion.  The meatus appears normal.  On exam the bladder and urethra are palpably normal.  There is no prolapse.  She has good support apically, anteriorly and posteriorly.  Laboratory Data: Lab Results  Component Value Date   WBC 5.8 01/14/2019   HGB 12.7 01/14/2019   HCT 37.9 01/14/2019   MCV 87.9 01/14/2019   PLT 234.0 01/14/2019    Lab Results  Component Value Date   CREATININE 0.66 01/14/2019    No results found for: PSA  No results found for: TESTOSTERONE  Lab Results  Component Value Date   HGBA1C 6.3 01/14/2019    Urinalysis  Component Value Date/Time   COLORURINE YELLOW (A) 11/27/2015 0224   APPEARANCEUR HAZY (A) 11/27/2015 0224   APPEARANCEUR Clear 09/25/2012 2123   LABSPEC 1.045 (H) 11/27/2015 0224   LABSPEC 1.017 09/25/2012 2123   PHURINE 5.0 11/27/2015 0224   GLUCOSEU NEGATIVE 11/27/2015 0224   GLUCOSEU Negative 09/25/2012 2123   HGBUR NEGATIVE 11/27/2015 0224   HGBUR negative 05/17/2010 0946   BILIRUBINUR  negative 01/14/2019 0934   BILIRUBINUR Negative 09/25/2012 2123   KETONESUR NEGATIVE 11/27/2015 0224   PROTEINUR Negative 01/14/2019 0934   PROTEINUR 30 (A) 11/27/2015 0224   UROBILINOGEN 0.2 01/14/2019 0934   UROBILINOGEN 1.0 08/30/2012 1511   NITRITE negative 01/14/2019 0934   NITRITE NEGATIVE 11/27/2015 0224   LEUKOCYTESUR Negative 01/14/2019 0934   LEUKOCYTESUR Negative 09/25/2012 2123    Lab Results  Component Value Date   BACTERIA RARE (A) 11/27/2015    Pertinent Imaging: CT scan No results found for this or any previous visit. No results found for this or any previous visit. No results found for this or any previous visit. No results found for this or any previous visit. No results found for this or any previous visit. No results found for this or any previous visit. No results found for this or any previous visit. No results found for this or any previous visit.  Assessment & Plan:    Urgency, urge incontinence- we discussed timed voiding.  We discussed the nature risk benefits and alternatives to anticholinergics and I will increase the oxybutynin to 5 mg p.o. twice daily.  We discussed her lower in a tract symptoms could be better or worse after back surgery.  We will see her back for a symptom check.  We also discussed weight loss is been shown to improve lower urinary tract symptoms.  No follow-ups on file.   Jerilee Field, MD  [facility] [address], [address] [place], [place] [address] [phone]

## 2019-03-07 ENCOUNTER — Other Ambulatory Visit: Payer: Self-pay

## 2019-03-07 DIAGNOSIS — R3915 Urgency of urination: Secondary | ICD-10-CM

## 2019-03-07 DIAGNOSIS — N3941 Urge incontinence: Secondary | ICD-10-CM

## 2019-03-07 MED ORDER — OXYBUTYNIN CHLORIDE ER 10 MG PO TB24: 10.0000 mg | ORAL_TABLET | Freq: Every day | ORAL | 11 refills | Status: DC

## 2019-03-07 NOTE — Telephone Encounter (Signed)
Incoming request from pharmacy to change RX from 5mg  ER twice daily to 1 10mg  ER tablet per insurance. Change made, new RX sent.

## 2019-03-07 NOTE — Telephone Encounter (Signed)
Last office visit 01/21/2019 for CPE.   Diflucan is not on current medication list.  It does look like patient is currently on doxycycline.  No future appointments.  Refill?

## 2019-03-09 NOTE — Telephone Encounter (Signed)
Spoke with Judeen Hammans.  She is currently on Doxycycline and she did request refill for the Diflucan for yeast infection.  Ok to refill Diflucan?

## 2019-03-09 NOTE — Telephone Encounter (Signed)
plz call patient to ask about need for this.

## 2019-03-16 DIAGNOSIS — F3181 Bipolar II disorder: Secondary | ICD-10-CM | POA: Diagnosis not present

## 2019-03-17 NOTE — Progress Notes (Signed)
[id number] [place], [place] - [address] [place] [address] Phone: [phone] Fax: [phone]      Your procedure is scheduled on Wednesday, 6/24.  Report to [facility] Main Entrance "A" at 10 am, and check in at the Admitting office.  Call this number if you have problems the morning of surgery:  [phone]  Call [phone] if you have any questions prior to your surgery date Monday-Friday 8am-4pm    Remember:  Do not eat or drink after midnight Tuesday.    Take these medicines the morning of surgery with A SIP OF WATER : Bring Albuterol Inhaler - ok to use if needed cyclobenzaprine (FLEXERIL) if needed lamoTRIgine (LAMICTAL)  Oxcarbazepine (TRILEPTAL) pantoprazole (PROTONIX)  traMADol (ULTRAM) if needed  STOP now taking any Aspirin (unless otherwise instructed by your surgeon), Aleve, Naproxen, Ibuprofen, Motrin, Advil, Goody's, BC's, all herbal medications, fish oil, and all vitamins.    The Morning of Surgery  Do not wear jewelry, make-up or nail polish.  Do not wear lotions, powders, or perfumes/colognes, or deodorant  Do not shave 48 hours prior to surgery.    Do not bring valuables to the hospital.  [facility] is not responsible for any belongings or valuables.  If you are a smoker, DO NOT Smoke 24 hours prior to surgery  IF you wear a CPAP at night please bring your mask, tubing, and machine the morning of surgery   Remember that you must have someone to transport you home after your surgery, and remain with you for 24 hours if you are discharged the same day.  Patients discharged the day of surgery will not be allowed to drive home   Contacts, glasses, hearing aids, dentures or bridgework may not be worn into surgery.   For patients admitted to the hospital, discharge time will be determined by your treatment team.      Special instructions:   [facility]- Preparing For Surgery  Before surgery, you can play  an important role. Because skin is not sterile, your skin needs to be as free of germs as possible. You can reduce the number of germs on your skin by washing with CHG (chlorahexidine gluconate) Soap before surgery.  CHG is an antiseptic cleaner which kills germs and bonds with the skin to continue killing germs even after washing.    Oral Hygiene is also important to reduce your risk of infection.  Remember - BRUSH YOUR TEETH THE MORNING OF SURGERY WITH YOUR REGULAR TOOTHPASTE  Please do not use if you have an allergy to CHG or antibacterial soaps. If your skin becomes reddened/irritated stop using the CHG.  Do not shave (including legs and underarms) for at least 48 hours prior to first CHG shower. It is OK to shave your face.  Please follow these instructions carefully.   1. Shower the [employer] BEFORE SURGERY (Tues) and the MORNING OF SURGERY (Wed) with CHG Soap.   2. If you chose to wash your hair, wash your hair first as usual with your normal shampoo.  3. After you shampoo, rinse your hair and body thoroughly to remove the shampoo.  4. Use CHG as you would any other liquid soap. You can apply CHG directly to the skin and wash gently with a scrungie or a clean washcloth.   5. Apply the CHG Soap to your body ONLY FROM THE NECK DOWN.  Do not use on open wounds or open sores. Avoid contact with your eyes, ears, mouth  and genitals (private parts). Wash Face and genitals (private parts)  with your normal soap.   6. Wash thoroughly, paying special attention to the area where your surgery will be performed.  7. Thoroughly rinse your body with warm water from the neck down.  8. DO NOT shower/wash with your normal soap after using and rinsing off the CHG Soap.  9. Pat yourself dry with a CLEAN TOWEL.  10. Wear CLEAN PAJAMAS to bed the night before surgery, wear comfortable clothes the morning of surgery  11. Place CLEAN SHEETS on your bed the night of your first shower and DO NOT SLEEP WITH  PETS.    Day of Surgery:  Do not apply any deodorants/lotions.  Please wear clean clothes to the hospital/surgery center.   Remember to brush your teeth WITH YOUR REGULAR TOOTHPASTE.   Please read over the following fact sheets that you were given.

## 2019-03-17 NOTE — Progress Notes (Addendum)
Patient informed of the [place] that is currently in effect.  Patient verbalized understanding.  Patient denies shortness of breath, fever, cough and chest pain at PAT appointment  PCP - Dr Alver Fisher Cardiologist -  Denies  Chest x-ray - 03/18/19 EKG - 03/18/19 Stress Test - Denies ECHO - Denies Cardiac Cath - Denies  Sleep Study - Yes- years ago, unknown location in [place], unable to request results CPAP - Does not use CPAP- patient elevates head of bed to sleep at night.  Fasting Blood Sugar - 120 Checks Blood Sugar __3-4___ times/week  Aspirin Instructions: last does 03/17/19 per MD  Anesthesia review: yes  STOP now Aleve, Naproxen, Ibuprofen, Motrin, Advil, Goody's, BC's, all herbal medications, fish oil, and all vitamins.  Coronavirus Screening Have you or your husband experienced the following symptoms:  Cough yes/no: No Fever (>100.69F)  yes/no: No Runny nose yes/no: No Sore throat yes/no: No Difficulty breathing/shortness of breath  yes/no: No  Have you or your husband traveled in the last 14 days and where? yes/no: No

## 2019-03-18 ENCOUNTER — Encounter (HOSPITAL_COMMUNITY)
Admission: RE | Admit: 2019-03-18 | Discharge: 2019-03-18 | Disposition: A | Discharge status: Home / Self Care | Payer: Medicare Other | Source: Ambulatory Visit | Attending: Neurosurgery | Admitting: Neurosurgery

## 2019-03-18 ENCOUNTER — Other Ambulatory Visit: Payer: Self-pay

## 2019-03-18 ENCOUNTER — Encounter (HOSPITAL_COMMUNITY): Payer: Self-pay

## 2019-03-18 ENCOUNTER — Other Ambulatory Visit (HOSPITAL_COMMUNITY): Payer: Medicare Other

## 2019-03-18 DIAGNOSIS — Z01812 Encounter for preprocedural laboratory examination: Secondary | ICD-10-CM | POA: Diagnosis not present

## 2019-03-18 DIAGNOSIS — Z1159 Encounter for screening for other viral diseases: Secondary | ICD-10-CM | POA: Insufficient documentation

## 2019-03-18 HISTORY — DX: Dependence on other enabling machines and devices: Z99.89

## 2019-03-18 HISTORY — DX: Personal history of other medical treatment: Z92.89

## 2019-03-18 HISTORY — DX: Hyperlipidemia, unspecified: E78.5

## 2019-03-18 LAB — BASIC METABOLIC PANEL
Anion gap: 11 (ref 5–15)
BUN: 15 mg/dL (ref 8–23)
CO2: 26 mmol/L (ref 22–32)
Calcium: 9.6 mg/dL (ref 8.9–10.3)
Chloride: 96 mmol/L — ABNORMAL LOW (ref 98–111)
Creatinine, Ser: 0.65 mg/dL (ref 0.44–1.00)
GFR calc Af Amer: >60 mL/min (ref >60)
GFR calc non Af Amer: >60 mL/min (ref >60)
Glucose, Bld: 123 mg/dL — ABNORMAL HIGH (ref 70–99)
Potassium: 4 mmol/L (ref 3.5–5.1)
Sodium: 133 mmol/L — ABNORMAL LOW (ref 135–145)

## 2019-03-18 LAB — CBC
HCT: 40.3 % (ref 36.0–46.0)
Hemoglobin: 13.2 g/dL (ref 12.0–15.0)
MCH: 29.3 pg (ref 26.0–34.0)
MCHC: 32.8 g/dL (ref 30.0–36.0)
MCV: 89.6 fL (ref 80.0–100.0)
Platelets: 254 10*3/uL (ref 150–400)
RBC: 4.5 MIL/uL (ref 3.87–5.11)
RDW: 14.5 % (ref 11.5–15.5)
WBC: 8.1 10*3/uL (ref 4.0–10.5)
nRBC: 0 % (ref 0.0–0.2)

## 2019-03-18 LAB — GLUCOSE, CAPILLARY: Glucose-Capillary: 125 mg/dL — ABNORMAL HIGH (ref 70–99)

## 2019-03-18 LAB — SURGICAL PCR SCREEN
MRSA, PCR: NEGATIVE
Staphylococcus aureus: NEGATIVE

## 2019-03-18 NOTE — Progress Notes (Signed)
Covid 19 screen rescheduled for 6/20 

## 2019-03-19 ENCOUNTER — Other Ambulatory Visit (HOSPITAL_COMMUNITY)
Admission: RE | Admit: 2019-03-19 | Discharge: 2019-03-19 | Disposition: A | Discharge status: Home / Self Care | Payer: Medicare Other | Source: Ambulatory Visit | Attending: Neurosurgery | Admitting: Neurosurgery

## 2019-03-19 DIAGNOSIS — Z01812 Encounter for preprocedural laboratory examination: Secondary | ICD-10-CM | POA: Diagnosis not present

## 2019-03-19 DIAGNOSIS — Z1159 Encounter for screening for other viral diseases: Secondary | ICD-10-CM | POA: Diagnosis not present

## 2019-03-19 LAB — SARS CORONAVIRUS 2 (TAT 6-24 HRS): SARS Coronavirus 2: NEGATIVE

## 2019-03-22 MED ORDER — VANCOMYCIN HCL 10 G IV SOLR
1500.0000 mg | INTRAVENOUS | Status: AC
  Administered 2019-03-23: 1500 mg via INTRAVENOUS
  Filled 2019-03-22: qty 1500

## 2019-03-23 ENCOUNTER — Inpatient Hospital Stay (HOSPITAL_COMMUNITY)
Admission: RE | Disposition: A | Discharge status: Home Health | Payer: Self-pay | Source: Home / Self Care | Attending: Neurosurgery

## 2019-03-23 ENCOUNTER — Encounter (HOSPITAL_COMMUNITY): Payer: Self-pay

## 2019-03-23 ENCOUNTER — Inpatient Hospital Stay (HOSPITAL_COMMUNITY)
Admission: RE | Admit: 2019-03-23 | Discharge: 2019-03-25 | DRG: 454 | Disposition: A | Discharge status: Home Health | Payer: Medicare Other | Attending: Neurosurgery | Admitting: Neurosurgery

## 2019-03-23 ENCOUNTER — Inpatient Hospital Stay (HOSPITAL_COMMUNITY): Payer: Medicare Other

## 2019-03-23 ENCOUNTER — Other Ambulatory Visit: Payer: Self-pay

## 2019-03-23 ENCOUNTER — Inpatient Hospital Stay (HOSPITAL_COMMUNITY): Payer: Medicare Other | Admitting: Physician Assistant

## 2019-03-23 ENCOUNTER — Ambulatory Visit: Payer: Medicare Other | Admitting: Family Medicine

## 2019-03-23 ENCOUNTER — Inpatient Hospital Stay (HOSPITAL_COMMUNITY): Payer: Medicare Other | Admitting: Certified Registered Nurse Anesthetist

## 2019-03-23 DIAGNOSIS — I10 Essential (primary) hypertension: Secondary | ICD-10-CM | POA: Diagnosis present

## 2019-03-23 DIAGNOSIS — Z882 Allergy status to sulfonamides status: Secondary | ICD-10-CM | POA: Diagnosis not present

## 2019-03-23 DIAGNOSIS — M4316 Spondylolisthesis, lumbar region: Secondary | ICD-10-CM | POA: Diagnosis not present

## 2019-03-23 DIAGNOSIS — G4733 Obstructive sleep apnea (adult) (pediatric): Secondary | ICD-10-CM | POA: Diagnosis present

## 2019-03-23 DIAGNOSIS — G2581 Restless legs syndrome: Secondary | ICD-10-CM | POA: Diagnosis present

## 2019-03-23 DIAGNOSIS — Z885 Allergy status to narcotic agent status: Secondary | ICD-10-CM | POA: Diagnosis not present

## 2019-03-23 DIAGNOSIS — F319 Bipolar disorder, unspecified: Secondary | ICD-10-CM | POA: Diagnosis present

## 2019-03-23 DIAGNOSIS — T8463XA Infection and inflammatory reaction due to internal fixation device of spine, initial encounter: Secondary | ICD-10-CM | POA: Diagnosis not present

## 2019-03-23 DIAGNOSIS — Z7951 Long term (current) use of inhaled steroids: Secondary | ICD-10-CM

## 2019-03-23 DIAGNOSIS — Z6841 Body Mass Index (BMI) 40.0 and over, adult: Secondary | ICD-10-CM | POA: Diagnosis not present

## 2019-03-23 DIAGNOSIS — Z881 Allergy status to other antibiotic agents status: Secondary | ICD-10-CM | POA: Diagnosis not present

## 2019-03-23 DIAGNOSIS — J432 Centrilobular emphysema: Secondary | ICD-10-CM | POA: Diagnosis present

## 2019-03-23 DIAGNOSIS — I2510 Atherosclerotic heart disease of native coronary artery without angina pectoris: Secondary | ICD-10-CM | POA: Diagnosis present

## 2019-03-23 DIAGNOSIS — M5116 Intervertebral disc disorders with radiculopathy, lumbar region: Secondary | ICD-10-CM | POA: Diagnosis present

## 2019-03-23 DIAGNOSIS — Y838 Other surgical procedures as the cause of abnormal reaction of the patient, or of later complication, without mention of misadventure at the time of the procedure: Secondary | ICD-10-CM | POA: Diagnosis present

## 2019-03-23 DIAGNOSIS — Z96643 Presence of artificial hip joint, bilateral: Secondary | ICD-10-CM | POA: Diagnosis present

## 2019-03-23 DIAGNOSIS — E785 Hyperlipidemia, unspecified: Secondary | ICD-10-CM | POA: Diagnosis not present

## 2019-03-23 DIAGNOSIS — M1990 Unspecified osteoarthritis, unspecified site: Secondary | ICD-10-CM | POA: Diagnosis present

## 2019-03-23 DIAGNOSIS — E1165 Type 2 diabetes mellitus with hyperglycemia: Secondary | ICD-10-CM | POA: Diagnosis not present

## 2019-03-23 DIAGNOSIS — Z888 Allergy status to other drugs, medicaments and biological substances status: Secondary | ICD-10-CM | POA: Diagnosis not present

## 2019-03-23 DIAGNOSIS — K219 Gastro-esophageal reflux disease without esophagitis: Secondary | ICD-10-CM | POA: Diagnosis present

## 2019-03-23 DIAGNOSIS — M48062 Spinal stenosis, lumbar region with neurogenic claudication: Secondary | ICD-10-CM | POA: Diagnosis not present

## 2019-03-23 DIAGNOSIS — Z811 Family history of alcohol abuse and dependence: Secondary | ICD-10-CM

## 2019-03-23 DIAGNOSIS — B9562 Methicillin resistant Staphylococcus aureus infection as the cause of diseases classified elsewhere: Secondary | ICD-10-CM | POA: Diagnosis not present

## 2019-03-23 DIAGNOSIS — Z87891 Personal history of nicotine dependence: Secondary | ICD-10-CM

## 2019-03-23 DIAGNOSIS — M47816 Spondylosis without myelopathy or radiculopathy, lumbar region: Secondary | ICD-10-CM | POA: Diagnosis not present

## 2019-03-23 DIAGNOSIS — Z419 Encounter for procedure for purposes other than remedying health state, unspecified: Secondary | ICD-10-CM

## 2019-03-23 DIAGNOSIS — M51369 Other intervertebral disc degeneration, lumbar region without mention of lumbar back pain or lower extremity pain: Secondary | ICD-10-CM

## 2019-03-23 DIAGNOSIS — Z8042 Family history of malignant neoplasm of prostate: Secondary | ICD-10-CM

## 2019-03-23 DIAGNOSIS — E871 Hypo-osmolality and hyponatremia: Secondary | ICD-10-CM | POA: Diagnosis not present

## 2019-03-23 DIAGNOSIS — Z833 Family history of diabetes mellitus: Secondary | ICD-10-CM

## 2019-03-23 DIAGNOSIS — M960 Pseudarthrosis after fusion or arthrodesis: Secondary | ICD-10-CM | POA: Diagnosis not present

## 2019-03-23 DIAGNOSIS — M9983 Other biomechanical lesions of lumbar region: Secondary | ICD-10-CM | POA: Diagnosis not present

## 2019-03-23 DIAGNOSIS — Z79890 Hormone replacement therapy (postmenopausal): Secondary | ICD-10-CM

## 2019-03-23 DIAGNOSIS — Z981 Arthrodesis status: Secondary | ICD-10-CM | POA: Diagnosis not present

## 2019-03-23 DIAGNOSIS — T8142XA Infection following a procedure, deep incisional surgical site, initial encounter: Secondary | ICD-10-CM | POA: Diagnosis present

## 2019-03-23 DIAGNOSIS — Z7982 Long term (current) use of aspirin: Secondary | ICD-10-CM

## 2019-03-23 DIAGNOSIS — M5416 Radiculopathy, lumbar region: Secondary | ICD-10-CM | POA: Diagnosis not present

## 2019-03-23 DIAGNOSIS — M8448XK Pathological fracture, other site, subsequent encounter for fracture with nonunion: Secondary | ICD-10-CM | POA: Diagnosis not present

## 2019-03-23 LAB — TYPE AND SCREEN
ABO/RH(D): A POS
Antibody Screen: POSITIVE

## 2019-03-23 LAB — GLUCOSE, CAPILLARY
Glucose-Capillary: 139 mg/dL — ABNORMAL HIGH (ref 70–99)
Glucose-Capillary: 156 mg/dL — ABNORMAL HIGH (ref 70–99)
Glucose-Capillary: 195 mg/dL — ABNORMAL HIGH (ref 70–99)

## 2019-03-23 SURGERY — POSTERIOR LUMBAR FUSION 1 WITH HARDWARE REMOVAL
Anesthesia: General | Site: Spine Lumbar

## 2019-03-23 MED ORDER — HYDROMORPHONE HCL 1 MG/ML IJ SOLN
INTRAMUSCULAR | Status: AC
  Administered 2019-03-23: 0.5 mg via INTRAVENOUS
  Filled 2019-03-23: qty 1

## 2019-03-23 MED ORDER — LEVOTHYROXINE SODIUM 75 MCG PO TABS
150.0000 ug | ORAL_TABLET | Freq: Every day | ORAL | Status: DC
  Administered 2019-03-23 – 2019-03-24 (×2): 150 ug via ORAL
  Filled 2019-03-23 (×2): qty 2

## 2019-03-23 MED ORDER — DOCUSATE SODIUM 100 MG PO CAPS
100.0000 mg | ORAL_CAPSULE | Freq: Two times a day (BID) | ORAL | Status: DC
  Administered 2019-03-23 – 2019-03-25 (×4): 100 mg via ORAL
  Filled 2019-03-23 (×4): qty 1

## 2019-03-23 MED ORDER — ACETAMINOPHEN 325 MG PO TABS
650.0000 mg | ORAL_TABLET | ORAL | Status: DC | PRN
  Administered 2019-03-25: 650 mg via ORAL
  Filled 2019-03-23: qty 2

## 2019-03-23 MED ORDER — PHENYLEPHRINE HCL (PRESSORS) 10 MG/ML IV SOLN
INTRAVENOUS | Status: DC | PRN
  Administered 2019-03-23: 120 ug via INTRAVENOUS
  Administered 2019-03-23: 80 ug via INTRAVENOUS

## 2019-03-23 MED ORDER — VANCOMYCIN HCL 1 G IV SOLR
INTRAVENOUS | Status: DC | PRN
  Administered 2019-03-23: 1000 mg via TOPICAL

## 2019-03-23 MED ORDER — FUROSEMIDE 40 MG PO TABS
40.0000 mg | ORAL_TABLET | Freq: Every day | ORAL | Status: DC
  Administered 2019-03-24: 40 mg via ORAL
  Filled 2019-03-23: qty 1

## 2019-03-23 MED ORDER — SODIUM CHLORIDE 0.9% FLUSH: 3.0000 mL | INTRAVENOUS | Status: DC | PRN

## 2019-03-23 MED ORDER — MIDAZOLAM HCL 2 MG/2ML IJ SOLN
INTRAMUSCULAR | Status: AC
  Filled 2019-03-23: qty 2

## 2019-03-23 MED ORDER — ONDANSETRON HCL 4 MG/2ML IJ SOLN: 4.0000 mg | Freq: Four times a day (QID) | INTRAMUSCULAR | Status: DC | PRN

## 2019-03-23 MED ORDER — SODIUM CHLORIDE 0.9 % IV SOLN
INTRAVENOUS | Status: DC | PRN
  Administered 2019-03-23: 20 ug/min via INTRAVENOUS

## 2019-03-23 MED ORDER — ZOLPIDEM TARTRATE 5 MG PO TABS: 5.0000 mg | ORAL_TABLET | Freq: Every evening | ORAL | Status: DC | PRN

## 2019-03-23 MED ORDER — PHENYLEPHRINE HCL (PRESSORS) 10 MG/ML IV SOLN
INTRAVENOUS | Status: AC
  Filled 2019-03-23: qty 1

## 2019-03-23 MED ORDER — SODIUM CHLORIDE 0.9 % IV SOLN
INTRAVENOUS | Status: DC | PRN
  Administered 2019-03-23: 500 mL

## 2019-03-23 MED ORDER — VANCOMYCIN HCL 10 G IV SOLR
1500.0000 mg | Freq: Once | INTRAVENOUS | Status: AC
  Administered 2019-03-23: 1500 mg via INTRAVENOUS
  Filled 2019-03-23: qty 1500

## 2019-03-23 MED ORDER — OXYBUTYNIN CHLORIDE ER 10 MG PO TB24
10.0000 mg | ORAL_TABLET | Freq: Every day | ORAL | Status: DC
  Administered 2019-03-23 – 2019-03-24 (×2): 10 mg via ORAL
  Filled 2019-03-23 (×3): qty 1

## 2019-03-23 MED ORDER — LIDOCAINE 2% (20 MG/ML) 5 ML SYRINGE
INTRAMUSCULAR | Status: DC | PRN
  Administered 2019-03-23: 100 mg via INTRAVENOUS

## 2019-03-23 MED ORDER — ONDANSETRON HCL 4 MG PO TABS: 4.0000 mg | ORAL_TABLET | Freq: Four times a day (QID) | ORAL | Status: DC | PRN

## 2019-03-23 MED ORDER — SUCCINYLCHOLINE CHLORIDE 200 MG/10ML IV SOSY
PREFILLED_SYRINGE | INTRAVENOUS | Status: AC
  Filled 2019-03-23: qty 10

## 2019-03-23 MED ORDER — DEXAMETHASONE SODIUM PHOSPHATE 10 MG/ML IJ SOLN
INTRAMUSCULAR | Status: DC | PRN
  Administered 2019-03-23: 4 mg via INTRAVENOUS

## 2019-03-23 MED ORDER — BUPIVACAINE-EPINEPHRINE (PF) 0.5% -1:200000 IJ SOLN
INTRAMUSCULAR | Status: AC
  Filled 2019-03-23: qty 30

## 2019-03-23 MED ORDER — TRAMADOL HCL 50 MG PO TABS
50.0000 mg | ORAL_TABLET | Freq: Four times a day (QID) | ORAL | Status: DC | PRN
  Administered 2019-03-25: 50 mg via ORAL
  Filled 2019-03-23: qty 1

## 2019-03-23 MED ORDER — PROPOFOL 10 MG/ML IV BOLUS
INTRAVENOUS | Status: AC
  Filled 2019-03-23: qty 40

## 2019-03-23 MED ORDER — FENTANYL CITRATE (PF) 100 MCG/2ML IJ SOLN
INTRAMUSCULAR | Status: AC
  Filled 2019-03-23: qty 2

## 2019-03-23 MED ORDER — OXCARBAZEPINE 300 MG PO TABS
300.0000 mg | ORAL_TABLET | Freq: Every day | ORAL | Status: DC
  Administered 2019-03-24 – 2019-03-25 (×2): 300 mg via ORAL
  Filled 2019-03-23 (×2): qty 1

## 2019-03-23 MED ORDER — MIDAZOLAM HCL 2 MG/2ML IJ SOLN
INTRAMUSCULAR | Status: DC | PRN
  Administered 2019-03-23: 2 mg via INTRAVENOUS

## 2019-03-23 MED ORDER — BISACODYL 10 MG RE SUPP: 10.0000 mg | Freq: Every day | RECTAL | Status: DC | PRN

## 2019-03-23 MED ORDER — BUPIVACAINE-EPINEPHRINE (PF) 0.5% -1:200000 IJ SOLN
INTRAMUSCULAR | Status: DC | PRN
  Administered 2019-03-23: 10 mL

## 2019-03-23 MED ORDER — CHLORHEXIDINE GLUCONATE CLOTH 2 % EX PADS: 6.0000 | MEDICATED_PAD | Freq: Once | CUTANEOUS | Status: DC

## 2019-03-23 MED ORDER — LIDOCAINE 2% (20 MG/ML) 5 ML SYRINGE
INTRAMUSCULAR | Status: AC
  Filled 2019-03-23: qty 10

## 2019-03-23 MED ORDER — BUPIVACAINE LIPOSOME 1.3 % IJ SUSP
20.0000 mL | Freq: Once | INTRAMUSCULAR | Status: AC
  Administered 2019-03-23: 20 mL
  Filled 2019-03-23: qty 20

## 2019-03-23 MED ORDER — ADULT MULTIVITAMIN W/MINERALS CH
2.0000 | ORAL_TABLET | Freq: Every day | ORAL | Status: DC
  Administered 2019-03-24 – 2019-03-25 (×2): 2 via ORAL
  Filled 2019-03-23 (×2): qty 2

## 2019-03-23 MED ORDER — ALBUTEROL SULFATE (2.5 MG/3ML) 0.083% IN NEBU: 3.0000 mL | INHALATION_SOLUTION | Freq: Four times a day (QID) | RESPIRATORY_TRACT | Status: DC | PRN

## 2019-03-23 MED ORDER — LIDOCAINE 2% (20 MG/ML) 5 ML SYRINGE
INTRAMUSCULAR | Status: AC
  Filled 2019-03-23: qty 5

## 2019-03-23 MED ORDER — ONDANSETRON HCL 4 MG PO TABS: 8.0000 mg | ORAL_TABLET | Freq: Three times a day (TID) | ORAL | Status: DC | PRN

## 2019-03-23 MED ORDER — ONDANSETRON HCL 4 MG/2ML IJ SOLN
INTRAMUSCULAR | Status: AC
  Filled 2019-03-23: qty 2

## 2019-03-23 MED ORDER — MORPHINE SULFATE (PF) 4 MG/ML IV SOLN
4.0000 mg | INTRAVENOUS | Status: DC | PRN
  Administered 2019-03-25: 4 mg via INTRAVENOUS
  Filled 2019-03-23: qty 1

## 2019-03-23 MED ORDER — MOMETASONE FURO-FORMOTEROL FUM 200-5 MCG/ACT IN AERO
2.0000 | INHALATION_SPRAY | Freq: Two times a day (BID) | RESPIRATORY_TRACT | Status: DC
  Administered 2019-03-23 – 2019-03-25 (×4): 2 via RESPIRATORY_TRACT
  Filled 2019-03-23: qty 8.8

## 2019-03-23 MED ORDER — ACETAMINOPHEN 650 MG RE SUPP: 650.0000 mg | RECTAL | Status: DC | PRN

## 2019-03-23 MED ORDER — DOXYCYCLINE HYCLATE 100 MG PO TABS
100.0000 mg | ORAL_TABLET | Freq: Every day | ORAL | Status: DC
  Administered 2019-03-23 – 2019-03-24 (×2): 100 mg via ORAL
  Filled 2019-03-23 (×3): qty 1

## 2019-03-23 MED ORDER — OXCARBAZEPINE 300 MG PO TABS: 300.0000 mg | ORAL_TABLET | ORAL | Status: DC

## 2019-03-23 MED ORDER — FENTANYL CITRATE (PF) 100 MCG/2ML IJ SOLN
INTRAMUSCULAR | Status: DC | PRN
  Administered 2019-03-23: 50 ug via INTRAVENOUS
  Administered 2019-03-23: 100 ug via INTRAVENOUS
  Administered 2019-03-23: 75 ug via INTRAVENOUS
  Administered 2019-03-23 (×2): 50 ug via INTRAVENOUS

## 2019-03-23 MED ORDER — SUGAMMADEX SODIUM 500 MG/5ML IV SOLN
INTRAVENOUS | Status: AC
  Filled 2019-03-23: qty 5

## 2019-03-23 MED ORDER — THROMBIN 5000 UNITS EX SOLR
OROMUCOSAL | Status: DC | PRN
  Administered 2019-03-23 (×2): 5 mL via TOPICAL

## 2019-03-23 MED ORDER — FENTANYL CITRATE (PF) 100 MCG/2ML IJ SOLN
INTRAMUSCULAR | Status: AC
  Administered 2019-03-23: 50 ug via INTRAVENOUS
  Filled 2019-03-23: qty 2

## 2019-03-23 MED ORDER — OXYCODONE HCL 5 MG PO TABS: 5.0000 mg | ORAL_TABLET | ORAL | Status: DC | PRN

## 2019-03-23 MED ORDER — ALBUMIN HUMAN 5 % IV SOLN
INTRAVENOUS | Status: DC | PRN
  Administered 2019-03-23: 13:00:00 via INTRAVENOUS

## 2019-03-23 MED ORDER — ACETAMINOPHEN 500 MG PO TABS
1000.0000 mg | ORAL_TABLET | Freq: Four times a day (QID) | ORAL | Status: AC
  Administered 2019-03-23 – 2019-03-24 (×4): 1000 mg via ORAL
  Filled 2019-03-23 (×4): qty 2

## 2019-03-23 MED ORDER — MENTHOL 3 MG MT LOZG: 1.0000 | LOZENGE | OROMUCOSAL | Status: DC | PRN

## 2019-03-23 MED ORDER — ROCURONIUM BROMIDE 10 MG/ML (PF) SYRINGE
PREFILLED_SYRINGE | INTRAVENOUS | Status: DC | PRN
  Administered 2019-03-23: 20 mg via INTRAVENOUS
  Administered 2019-03-23: 30 mg via INTRAVENOUS
  Administered 2019-03-23: 10 mg via INTRAVENOUS
  Administered 2019-03-23: 20 mg via INTRAVENOUS
  Administered 2019-03-23: 60 mg via INTRAVENOUS
  Administered 2019-03-23: 20 mg via INTRAVENOUS

## 2019-03-23 MED ORDER — PROPOFOL 10 MG/ML IV BOLUS
INTRAVENOUS | Status: DC | PRN
  Administered 2019-03-23: 20 mg via INTRAVENOUS
  Administered 2019-03-23: 70 mg via INTRAVENOUS
  Administered 2019-03-23: 50 mg via INTRAVENOUS

## 2019-03-23 MED ORDER — GLYCOPYRROLATE PF 0.2 MG/ML IJ SOSY
PREFILLED_SYRINGE | INTRAMUSCULAR | Status: AC
  Filled 2019-03-23: qty 5

## 2019-03-23 MED ORDER — BACITRACIN ZINC 500 UNIT/GM EX OINT
TOPICAL_OINTMENT | CUTANEOUS | Status: AC
  Filled 2019-03-23: qty 28.35

## 2019-03-23 MED ORDER — OXYCODONE HCL 5 MG PO TABS
10.0000 mg | ORAL_TABLET | ORAL | Status: DC | PRN
  Administered 2019-03-23 – 2019-03-25 (×13): 10 mg via ORAL
  Filled 2019-03-23 (×13): qty 2

## 2019-03-23 MED ORDER — PHENOL 1.4 % MT LIQD: 1.0000 | OROMUCOSAL | Status: DC | PRN

## 2019-03-23 MED ORDER — VANCOMYCIN HCL 1000 MG IV SOLR
INTRAVENOUS | Status: AC
  Filled 2019-03-23: qty 1000

## 2019-03-23 MED ORDER — LAMOTRIGINE 100 MG PO TABS
200.0000 mg | ORAL_TABLET | Freq: Every day | ORAL | Status: DC
  Administered 2019-03-24 – 2019-03-25 (×2): 200 mg via ORAL
  Filled 2019-03-23 (×2): qty 2

## 2019-03-23 MED ORDER — LOSARTAN POTASSIUM 50 MG PO TABS
100.0000 mg | ORAL_TABLET | Freq: Every day | ORAL | Status: DC
  Administered 2019-03-23 – 2019-03-24 (×2): 100 mg via ORAL
  Filled 2019-03-23 (×3): qty 2

## 2019-03-23 MED ORDER — PANTOPRAZOLE SODIUM 40 MG PO TBEC
40.0000 mg | DELAYED_RELEASE_TABLET | Freq: Every day | ORAL | Status: DC
  Administered 2019-03-24 – 2019-03-25 (×2): 40 mg via ORAL
  Filled 2019-03-23 (×2): qty 1

## 2019-03-23 MED ORDER — ROCURONIUM BROMIDE 10 MG/ML (PF) SYRINGE
PREFILLED_SYRINGE | INTRAVENOUS | Status: AC
  Filled 2019-03-23: qty 10

## 2019-03-23 MED ORDER — HYDROMORPHONE HCL 1 MG/ML IJ SOLN
0.2500 mg | INTRAMUSCULAR | Status: DC | PRN
  Administered 2019-03-23 (×2): 0.5 mg via INTRAVENOUS

## 2019-03-23 MED ORDER — ROCURONIUM BROMIDE 10 MG/ML (PF) SYRINGE
PREFILLED_SYRINGE | INTRAVENOUS | Status: AC
  Filled 2019-03-23: qty 30

## 2019-03-23 MED ORDER — SODIUM CHLORIDE 0.9 % IV SOLN: 250.0000 mL | INTRAVENOUS | Status: DC

## 2019-03-23 MED ORDER — FENTANYL CITRATE (PF) 250 MCG/5ML IJ SOLN
INTRAMUSCULAR | Status: AC
  Filled 2019-03-23: qty 5

## 2019-03-23 MED ORDER — 0.9 % SODIUM CHLORIDE (POUR BTL) OPTIME
TOPICAL | Status: DC | PRN
  Administered 2019-03-23: 1000 mL

## 2019-03-23 MED ORDER — THROMBIN 5000 UNITS EX SOLR
CUTANEOUS | Status: AC
  Filled 2019-03-23: qty 5000

## 2019-03-23 MED ORDER — SIMVASTATIN 20 MG PO TABS
40.0000 mg | ORAL_TABLET | Freq: Every evening | ORAL | Status: DC
  Administered 2019-03-23 – 2019-03-24 (×2): 40 mg via ORAL
  Filled 2019-03-23 (×2): qty 2

## 2019-03-23 MED ORDER — BACITRACIN ZINC 500 UNIT/GM EX OINT
TOPICAL_OINTMENT | CUTANEOUS | Status: DC | PRN
  Administered 2019-03-23: 1 via TOPICAL

## 2019-03-23 MED ORDER — CYCLOBENZAPRINE HCL 10 MG PO TABS
10.0000 mg | ORAL_TABLET | Freq: Three times a day (TID) | ORAL | Status: DC | PRN
  Administered 2019-03-23 – 2019-03-25 (×4): 10 mg via ORAL
  Filled 2019-03-23 (×4): qty 1

## 2019-03-23 MED ORDER — LACTATED RINGERS IV SOLN
INTRAVENOUS | Status: DC
  Administered 2019-03-23 (×2): via INTRAVENOUS

## 2019-03-23 MED ORDER — SODIUM CHLORIDE 0.9% FLUSH: 3.0000 mL | Freq: Two times a day (BID) | INTRAVENOUS | Status: DC

## 2019-03-23 MED ORDER — DEXAMETHASONE SODIUM PHOSPHATE 10 MG/ML IJ SOLN
INTRAMUSCULAR | Status: AC
  Filled 2019-03-23: qty 1

## 2019-03-23 MED ORDER — MONTELUKAST SODIUM 10 MG PO TABS
10.0000 mg | ORAL_TABLET | Freq: Every day | ORAL | Status: DC
  Administered 2019-03-23 – 2019-03-24 (×2): 10 mg via ORAL
  Filled 2019-03-23 (×3): qty 1

## 2019-03-23 MED ORDER — PROMETHAZINE HCL 25 MG PO TABS: 25.0000 mg | ORAL_TABLET | Freq: Four times a day (QID) | ORAL | Status: DC | PRN

## 2019-03-23 MED ORDER — NYSTATIN 100000 UNIT/GM EX POWD
Freq: Four times a day (QID) | CUTANEOUS | Status: DC
  Administered 2019-03-23 – 2019-03-25 (×8): via TOPICAL
  Filled 2019-03-23: qty 15

## 2019-03-23 MED ORDER — OXCARBAZEPINE 300 MG PO TABS
600.0000 mg | ORAL_TABLET | Freq: Every day | ORAL | Status: DC
  Administered 2019-03-23 – 2019-03-24 (×2): 600 mg via ORAL
  Filled 2019-03-23 (×3): qty 2

## 2019-03-23 MED ORDER — FENTANYL CITRATE (PF) 100 MCG/2ML IJ SOLN
25.0000 ug | INTRAMUSCULAR | Status: DC | PRN
  Administered 2019-03-23 (×3): 50 ug via INTRAVENOUS

## 2019-03-23 SURGICAL SUPPLY — 61 items
APL SKNCLS STERI-STRIP NONHPOA (GAUZE/BANDAGES/DRESSINGS) ×1
BAG DECANTER FOR FLEXI CONT (MISCELLANEOUS) ×2 IMPLANT
BENZOIN TINCTURE PRP APPL 2/3 (GAUZE/BANDAGES/DRESSINGS) ×2 IMPLANT
BLADE CLIPPER SURG (BLADE) IMPLANT
BUR MATCHSTICK NEURO 3.0 LAGG (BURR) ×2 IMPLANT
BUR PRECISION FLUTE 6.0 (BURR) ×3 IMPLANT
CAGE ALTERA 10X31X9-13 15D (Cage) ×1 IMPLANT
CANISTER SUCT 3000ML PPV (MISCELLANEOUS) ×2 IMPLANT
CARTRIDGE OIL MAESTRO DRILL (MISCELLANEOUS) ×1 IMPLANT
CONT SPEC 4OZ CLIKSEAL STRL BL (MISCELLANEOUS) ×2 IMPLANT
COVER BACK TABLE 60X90IN (DRAPES) ×2 IMPLANT
COVER WAND RF STERILE (DRAPES) ×2 IMPLANT
DECANTER SPIKE VIAL GLASS SM (MISCELLANEOUS) ×2 IMPLANT
DIFFUSER DRILL AIR PNEUMATIC (MISCELLANEOUS) ×2 IMPLANT
DRAPE C-ARM 42X72 X-RAY (DRAPES) ×4 IMPLANT
DRAPE HALF SHEET 40X57 (DRAPES) ×2 IMPLANT
DRAPE LAPAROTOMY 100X72X124 (DRAPES) ×2 IMPLANT
DRAPE SURG 17X23 STRL (DRAPES) ×8 IMPLANT
DRSG OPSITE POSTOP 4X6 (GAUZE/BANDAGES/DRESSINGS) ×1 IMPLANT
ELECT BLADE 4.0 EZ CLEAN MEGAD (MISCELLANEOUS) ×2
ELECT REM PT RETURN 9FT ADLT (ELECTROSURGICAL) ×2
ELECTRODE BLDE 4.0 EZ CLN MEGD (MISCELLANEOUS) ×1 IMPLANT
ELECTRODE REM PT RTRN 9FT ADLT (ELECTROSURGICAL) ×1 IMPLANT
GAUZE 4X4 16PLY RFD (DISPOSABLE) ×2 IMPLANT
GAUZE SPONGE 4X4 12PLY STRL (GAUZE/BANDAGES/DRESSINGS) ×2 IMPLANT
GLOVE BIO SURGEON STRL SZ8 (GLOVE) ×4 IMPLANT
GLOVE BIO SURGEON STRL SZ8.5 (GLOVE) ×4 IMPLANT
GLOVE EXAM NITRILE XL STR (GLOVE) IMPLANT
GOWN STRL REUS W/ TWL LRG LVL3 (GOWN DISPOSABLE) IMPLANT
GOWN STRL REUS W/ TWL XL LVL3 (GOWN DISPOSABLE) ×2 IMPLANT
GOWN STRL REUS W/TWL 2XL LVL3 (GOWN DISPOSABLE) IMPLANT
GOWN STRL REUS W/TWL LRG LVL3 (GOWN DISPOSABLE)
GOWN STRL REUS W/TWL XL LVL3 (GOWN DISPOSABLE) ×4
HEMOSTAT POWDER KIT SURGIFOAM (HEMOSTASIS) ×3 IMPLANT
KIT BASIN OR (CUSTOM PROCEDURE TRAY) ×2 IMPLANT
KIT INFUSE SMALL (Orthopedic Implant) ×1 IMPLANT
KIT TURNOVER KIT B (KITS) ×2 IMPLANT
MILL MEDIUM DISP (BLADE) ×2 IMPLANT
NDL HYPO 21X1.5 SAFETY (NEEDLE) IMPLANT
NEEDLE HYPO 21X1.5 SAFETY (NEEDLE) IMPLANT
NEEDLE HYPO 22GX1.5 SAFETY (NEEDLE) ×2 IMPLANT
NS IRRIG 1000ML POUR BTL (IV SOLUTION) ×2 IMPLANT
OIL CARTRIDGE MAESTRO DRILL (MISCELLANEOUS) ×2
PACK LAMINECTOMY NEURO (CUSTOM PROCEDURE TRAY) ×2 IMPLANT
PAD ARMBOARD 7.5X6 YLW CONV (MISCELLANEOUS) ×6 IMPLANT
PATTIES SURGICAL 1X1 (DISPOSABLE) ×1 IMPLANT
PUTTY DBM 10CC CALC GRAN (Putty) ×1 IMPLANT
ROD CVD PS3 5.5X51 (Rod) ×1 IMPLANT
ROD CVD PS3 5.5X70 (Rod) ×1 IMPLANT
SCREW PED THRD PS3 8.5X50 (Screw) ×5 IMPLANT
SCREW SET CAP LOCK PS3 (Cap) ×5 IMPLANT
SPONGE LAP 4X18 RFD (DISPOSABLE) IMPLANT
STRIP CLOSURE SKIN 1/2X4 (GAUZE/BANDAGES/DRESSINGS) ×2 IMPLANT
SUT VIC AB 1 CT1 18XBRD ANBCTR (SUTURE) ×2 IMPLANT
SUT VIC AB 1 CT1 8-18 (SUTURE) ×4
SUT VIC AB 2-0 CP2 18 (SUTURE) ×4 IMPLANT
SYR 20CC LL (SYRINGE) IMPLANT
TOWEL GREEN STERILE (TOWEL DISPOSABLE) ×2 IMPLANT
TOWEL GREEN STERILE FF (TOWEL DISPOSABLE) ×2 IMPLANT
TRAY FOLEY MTR SLVR 16FR STAT (SET/KITS/TRAYS/PACK) ×2 IMPLANT
WATER STERILE IRR 1000ML POUR (IV SOLUTION) ×2 IMPLANT

## 2019-03-23 NOTE — Transfer of Care (Signed)
Immediate Anesthesia Transfer of Care Note  Patient: Amy Barnes  Procedure(s) Performed: POSTERIOR LUMBAR INTERBODY FUSION LUMBAR FOUR- LUMBAR FIVE WITH EXPLORATION OF PREVIOUS FUSION AND REMOVAL OF HARDWARE (N/A Spine Lumbar)  Patient Location: PACU  Anesthesia Type:General  Level of Consciousness: drowsy and patient cooperative  Airway & Oxygen Therapy: Patient Spontanous Breathing and Patient connected to face mask oxygen  Post-op Assessment: Report given to RN, Post -op Vital signs reviewed and stable and Patient moving all extremities  Post vital signs: Reviewed and stable  Last Vitals:  Vitals Value Taken Time  BP 165/81 03/23/19 1624  Temp    Pulse 96 03/23/19 1627  Resp 16 03/23/19 1627  SpO2 98 % 03/23/19 1627  Vitals shown include unvalidated device data.  Last Pain:  Vitals:   03/23/19 1037  TempSrc: Oral  PainSc: 5       Patients Stated Pain Goal: 2 (24/82/50 0370)  Complications: No apparent anesthesia complications

## 2019-03-23 NOTE — Progress Notes (Signed)
Pharmacy Antibiotic Note  Amy Barnes is a 68 y.o. female admitted on 03/23/2019 with surgical prophylaxis.  Pharmacy has been consulted for vancomycin dosing.  Received vancomycin 1500 mg this AM around 11.  No drain in place post op, so will just need 1 dose of vancomycin.  Est CrCl ~ 100 ml/min  Plan: Vancomycin 1500 mg x 1 tonight.  Height: 5\' 4"  (162.6 cm) Weight: (!) 371 lb 3.2 oz (168.4 kg) IBW/kg (Calculated) : 54.7  Temp (24hrs), Avg:97.5 F (36.4 C), Min:97.2 F (36.2 C), Max:98.2 F (36.8 C)  Recent Labs  Lab 03/18/19 1530  WBC 8.1  CREATININE 0.65    Estimated Creatinine Clearance: 109.4 mL/min (by C-G formula based on SCr of 0.65 mg/dL).    Allergies  Allergen Reactions  . Cephalexin Hives  . Hydrocodone Other (See Comments)    Reaction:  Hallucinations   . Risperidone And Related Other (See Comments)    Reaction:  Made pt excessively sleepy  . Seroquel [Quetiapine Fumarate] Other (See Comments)    Reaction:  Made pt excessively sleepy  . Sulfa Antibiotics Rash    Thank you for allowing pharmacy to be a part of this patient's care.  Marguerite Olea, [facility] Clinical Pharmacist Phone [phone]  03/23/2019 7:12 PM

## 2019-03-23 NOTE — Anesthesia Preprocedure Evaluation (Signed)
Anesthesia Evaluation   Patient awake    Reviewed: Allergy & Precautions, NPO status , Patient's Chart, lab work & pertinent test results  Airway Mallampati: II  TM Distance: >3 FB     Dental   Pulmonary pneumonia, COPD, former smoker,    breath sounds clear to auscultation       Cardiovascular hypertension, + CAD and + DOE   Rhythm:Regular Rate:Normal     Neuro/Psych    GI/Hepatic Neg liver ROS, GERD  ,  Endo/Other  diabetes  Renal/GU negative Renal ROS     Musculoskeletal   Abdominal   Peds  Hematology   Anesthesia Other Findings   Reproductive/Obstetrics                             Anesthesia Physical Anesthesia Plan  ASA: III  Anesthesia Plan: General   Post-op Pain Management:    Induction: Intravenous  PONV Risk Score and Plan: 3 and Ondansetron  Airway Management Planned: Oral ETT  Additional Equipment:   Intra-op Plan:   Post-operative Plan: Extubation in OR  Informed Consent: I have reviewed the patients History and Physical, chart, labs and discussed the procedure including the risks, benefits and alternatives for the proposed anesthesia with the patient or authorized representative who has indicated his/her understanding and acceptance.     Dental advisory given  Plan Discussed with: CRNA and Anesthesiologist  Anesthesia Plan Comments:         Anesthesia Quick Evaluation

## 2019-03-23 NOTE — Anesthesia Postprocedure Evaluation (Signed)
Anesthesia Post Note  Patient: Amy Barnes  Procedure(s) Performed: POSTERIOR LUMBAR INTERBODY FUSION LUMBAR FOUR- LUMBAR FIVE WITH EXPLORATION OF PREVIOUS FUSION AND REMOVAL OF HARDWARE (N/A Spine Lumbar)     Patient location during evaluation: PACU Anesthesia Type: General Level of consciousness: awake Pain management: pain level controlled Vital Signs Assessment: post-procedure vital signs reviewed and stable Respiratory status: spontaneous breathing Cardiovascular status: stable Postop Assessment: no apparent nausea or vomiting Anesthetic complications: no    Last Vitals:  Vitals:   03/23/19 1740 03/23/19 1755  BP: (!) 153/87 (!) 166/86  Pulse: 88 92  Resp: 15 18  Temp:  (!) 36.2 C  SpO2: 98% 98%    Last Pain:  Vitals:   03/23/19 1733  TempSrc:   PainSc: Asleep                 Bayley Yarborough

## 2019-03-23 NOTE — Progress Notes (Signed)
Subjective: The patient is alert and pleasant.  She looks well.  Objective: Vital signs in last 24 hours: Temp:  [97.2 F (36.2 C)-98.2 F (36.8 C)] 97.2 F (36.2 C) (06/24 1625) Pulse Rate:  [73-95] 92 (06/24 1755) Resp:  [13-18] 18 (06/24 1755) BP: (143-169)/(40-87) 166/86 (06/24 1755) SpO2:  [93 %-99 %] 98 % (06/24 1755) Weight:  [168.4 kg] 168.4 kg (06/24 1037) Estimated body mass index is 63.72 kg/m as calculated from the following:   Height as of this encounter: 5\' 4"  (1.626 m).   Weight as of this encounter: 168.4 kg.   Intake/Output from previous day: No intake/output data recorded. Intake/Output this shift: Total I/O In: 1650 [I.V.:1400; IV Piggyback:250] Out: 625 [Urine:275; Blood:350]  Physical exam the patient is alert and pleasant.  She is moving her lower extremities well.  Lab Results: No results for input(s): WBC, HGB, HCT, PLT in the last 72 hours. BMET No results for input(s): NA, K, CL, CO2, GLUCOSE, BUN, CREATININE, CALCIUM in the last 72 hours.  Studies/Results: Dg C-arm 1-60 Min  Result Date: 03/23/2019 CLINICAL DATA:  Posterior lumbar interbody fusion L4-L5 EXAM: DG C-ARM 61-120 MIN; LUMBAR SPINE - 2-3 VIEW COMPARISON:  MRI 09/15/2018, radiograph 08/31/2018 FINDINGS: Two low resolution intraoperative spot views of the lumbar spine. Total fluoroscopy time was 27 seconds. Submitted images demonstrate removal of previously noted posterior spinal rods at L3-L4 and left fixating screw at L3. Placement of fixating screws at what appears to be L5 with interbody device at presumed L4-L5 level. IMPRESSION: Intraoperative fluoroscopic assistance provided during lumbar spine surgery Electronically Signed   By: Donavan Foil M.D.   On: 03/23/2019 17:25    Assessment/Plan: The patient is doing well.  LOS: 0 days     Amy Barnes 03/23/2019, 6:00 PM

## 2019-03-23 NOTE — Anesthesia Procedure Notes (Signed)
Procedure Name: Intubation Date/Time: 03/23/2019 12:06 PM Performed by: Belinda Block, MD Pre-anesthesia Checklist: Patient identified, Emergency Drugs available, Suction available and Patient being monitored Patient Re-evaluated:Patient Re-evaluated prior to induction Oxygen Delivery Method: Circle System Utilized Preoxygenation: Pre-oxygenation with 100% oxygen Induction Type: IV induction Ventilation: Mask ventilation without difficulty Laryngoscope Size: Mac and 3 Grade View: Grade I Tube type: Oral Tube size: 7.0 mm Number of attempts: 1 Airway Equipment and Method: Stylet Placement Confirmation: ETT inserted through vocal cords under direct vision,  positive ETCO2 and breath sounds checked- equal and bilateral Secured at: 20 cm Tube secured with: Tape Dental Injury: Teeth and Oropharynx as per pre-operative assessment

## 2019-03-23 NOTE — Op Note (Signed)
Brief history: The patient is a 68 year old morbidly obese [place] female on whom I performed an L3-4 decompression and fusion many years ago.  Has had some chronic back pain but more recently has developed increasing back and leg pain.  She failed medical management and was worked up with lumbar x-rays and a lumbar MRI which demonstrated findings consistent with a lumbar pseudoarthrosis as well as an L4-5 spondylolisthesis and facet arthropathy.  I discussed the various treatment options.  She has weighed the risks, benefits and alternatives surgery and decided proceed with a a lumbar decompression, instrumentation and fusion.  Preoperative diagnosis: Lumbar pseudoarthrosis, lumbar spondylolisthesis, degenerative disc disease, spinal stenosis compressing both the L4 and the L5 nerve roots; lumbago; lumbar radiculopathy; neurogenic claudication  Postoperative diagnosis: The same and possible wound infection  Procedure: Bilateral L4-5 laminotomy/foraminotomies/medial facetectomy to decompress the bilateral L4 and L5 nerve roots(the work required to do this was in addition to the work required to do the posterior lumbar interbody fusion because of the patient's spinal stenosis, facet arthropathy. Etc. requiring a wide decompression of the nerve roots.);  L4-5 transforaminal lumbar interbody fusion with local morselized autograft bone and Zimmer DBM; insertion of interbody prosthesis at L4-5 (globus peek expandable interbody prosthesis); posterior segmental instrumentation from L3 to to L5 with globus titanium pedicle screws and rods; posterior lateral arthrodesis at L3-4 and L4-5 with local morselized autograft bone and Zimmer DBM ; exploration of lumbar fusion/removal of old lumbar hardware  Surgeon: Dr. Delma Officer  Asst.: Hildred Priest nurse practitioner  Anesthesia: Gen. endotracheal  Estimated blood loss: 300 cc  Drains: None  Complications: None  Description of procedure: The patient was  brought to the operating room by the anesthesia team. General endotracheal anesthesia was induced. The patient was turned to the prone position on the Wilson frame. The patient's lumbosacral region was then prepared with Betadine scrub and Betadine solution. Sterile drapes were applied.  I then injected the area to be incised with Marcaine with epinephrine solution. I then used the scalpel to make a linear midline incision over the L3-4 and L4-5 interspace, incising through the old surgical scar. I then used electrocautery to perform a bilateral subperiosteal dissection exposing the spinous process and lamina of L3, L4 and L5 and exposing the old hardware at L3-4.Marland Kitchen  We then inserted the Verstrac retractor to provide exposure.  We explored the fusion by removing the caps from the old screws.  We then remove the rods.  The screws on the left were quite loose.  The screws on the right were quite as loose.  We then remove the old screws from L3 and L4 bilaterally.  We inspected the posterior lateral arthrodesis.  It did not appear solid.  There was still motion at L3-4 indicative of a pseudoarthrosis.  I began the decompression by using the high speed drill to perform laminotomies at L4-5 bilaterally. We then used the Kerrison punches to widen the laminotomy and removed the ligamentum flavum at L4-5 on the right and the epidural scar tissue, from the previous laminectomy, on the left. We used the Kerrison punches to remove the medial facets at L4-5 bilaterally. We performed wide foraminotomies about the bilateral L4 and L5 nerve roots completing the decompression.  We now turned our attention to the posterior lumbar interbody fusion. I used a scalpel to incise the intervertebral disc at L4-5 bilaterally. I then performed a partial intervertebral discectomy at L4-5 bilaterally using the pituitary forceps. We prepared the vertebral endplates at L4-5  bilaterally for the fusion by removing the soft tissues with the  curettes. We then used the trial spacers to pick the appropriate sized interbody prosthesis. We prefilled his prosthesis with a combination of bone morphogenic protein soaked collagen sponges, local morselized autograft bone that we obtained during the decompression as well as Zimmer DBM. We inserted the prefilled prosthesis into the interspace at L4-5, we then turned and expanded the prosthesis. There was a good snug fit of the prosthesis in the interspace. We then filled and the remainder of the intervertebral disc space with local morselized autograft bone and Zimmer DBM. This completed the posterior lumbar interbody arthrodesis.  We now turned attention to the instrumentation. Under fluoroscopic guidance we cannulated the bilateral L5 pedicles with the bone probe. We then removed the bone probe. We then tapped the pedicle with a 6.5 millimeter tap. We then removed the tap. We probed inside the tapped pedicle with a ball probe to rule out cortical breaches. We then inserted a 8.5 x 50 millimeter pedicle screw into the L5 pedicles bilaterally under fluoroscopic guidance. We then palpated along the medial aspect of the pedicles to rule out cortical breaches. There were none. The nerve roots were not injured.  I reinserted 8.5 x 50 mm pedicle screws into the right L3 and L4 pedicle via the old screw holes.  We got decent purchase on the right.  I reinserted an 8.5 x 50 mm pedicle screw into the left L4 pedicle and got decent purchase.  When I reinserted an 8.5 x 50 mm screw into the left L3 pedicle screw via the old hole, the screw was quite loose.  I therefore removed it.  We did not have any larger screws.  We then connected the unilateral pedicle screws with a lordotic rod. We compressed the construct and secured the rod in place with the caps. We then tightened the caps appropriately. This completed the instrumentation from L3-L5.  We now turned our attention to the posterior lateral arthrodesis at L4-5  bilaterally and redo at L3-4 bilaterally.  I used electrocautery to remove the soft tissue overlying the L3-4 and L4-5 facet, transverse process, etc.  We used the high-speed drill to decorticate the remainder of the facets, pars, transverse process at L3-4 and L4-5 bilaterally. We then applied a combination of bone morphogenic protein soaked collagen sponges, local morselized autograft bone and Zimmer DBM over these decorticated posterior lateral structures at L3-4 and L4-5 bilaterally. This completed the posterior lateral arthrodesis at L3-4 and L4-5 bilaterally.  We then obtained hemostasis using bipolar electrocautery. We irrigated the wound out with bacitracin solution. We inspected the thecal sac and nerve roots and noted they were well decompressed. We then removed the retractor. We placed vancomycin powder in the wound.  We injected Exparel . We reapproximated patient's thoracolumbar fascia with interrupted #1 Vicryl suture. We reapproximated patient's subcutaneous tissue with interrupted 2-0 Vicryl suture. The reapproximated patient's skin with Steri-Strips and benzoin. The wound was then coated with bacitracin ointment. A sterile dressing was applied. The drapes were removed. The patient was subsequently returned to the supine position where they were extubated by the anesthesia team. He was then transported to the post anesthesia care unit in stable condition. All sponge instrument and needle counts were reportedly correct at the end of this case.

## 2019-03-23 NOTE — H&P (Signed)
Subjective: The patient is a 68 year old morbidly obese [place] female on whom I previously performed an L4-5 fusion years ago.  She does not fairly well.  She is developed worsening back and leg pain.  She has failed medical management.  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  I discussed the various treatment options with her.  She has decided to proceed with surgery after weighing the risks, benefits and alternatives.  Past Medical History:  Diagnosis Date  . Allergic rhinitis   . Ambulates with cane    straight  . Anemia   . Anxiety   . Asthma    seasonal  . Bipolar affective disorder (HCC)    takes Synthroid meds for Bipolar  . CAD (coronary artery disease) 07/2016   by CT scan  . Carpal tunnel syndrome    had surgery but occasional still has some issues per patient  . Cataract   . Centrilobular emphysema (HCC) 07/2016   by CT scan - pt not aware of this  . Constipation due to pain medication   . Depression with anxiety   . Diabetes mellitus    type 2 - no meds diet controlled  . GERD (gastroesophageal reflux disease)   . History of blood transfusion   . 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 Hyperlipidemia   . Hypertension   . OSA (obstructive sleep apnea)    no longer using cpap, uses a bed that raises and lowers hob  . Osteoarthritis   . Osteoarthritis   . Pneumonia   . Restless legs   . Septic arthritis (HCC) 10/11/2012  . Shingles 06/30/2016  . Shortness of breath    with exertion  . Status post revision of total hip replacement bilateral   prosthetic infection R 2013, L 2015  . SVD (spontaneous vaginal delivery)    x 3  . Thoracic aortic atherosclerosis (HCC) 11/207   by CT    Past Surgical History:  Procedure Laterality Date  . CARPAL TUNNEL RELEASE Right 05/15/2011  . CARPAL TUNNEL RELEASE Left 12/24/2017   Procedure: LEFT CARPAL  TUNNEL RELEASE;  Surgeon: Betha LoaKuzma, Kevin, MD;  Location: [facility];  Service: Orthopedics;  Laterality: Left;  . CERVICAL FUSION  2012   C2/3/4  . COLONOSCOPY WITH PROPOFOL N/A 12/31/2015   diverticulosis, int hem, o/w normal rpt 10 yrs ([place])  . EYE SURGERY Bilateral    cataract surgery with lens implant  . FOOT SURGERY  1982   bone spur  . I&D EXTREMITY  09/06/2012   Budd PalmerMichael H Handy, MD; Right;  I&D of right thigh  . I&D EXTREMITY Left 07/2013   wound vac - daily doxycycline indefinitely  . KNEE ARTHROSCOPY  09/01/2012   Dannielle HuhSteve Lucey, MD;  Right  . LUMBAR FUSION  01/08/2012   L3-4  . LUMBAR LAMINECTOMY/DECOMPRESSION MICRODISCECTOMY N/A 11/13/2016   LAMINOTOMY/LAMINECTOMY LUMBAR FOUR LUMBAR FIVE  WITH RESECTION OF SYNOVIAL CYST;  Surgeon: Tressie StalkerJeffrey Hatcher Froning, MD  . NECK SURGERY     Herniated disk C2,3,4  . NOSE SURGERY    . PARTIAL HYSTERECTOMY  1984   for mennorhagia, ovaries remain  . REVISION TOTAL HIP ARTHROPLASTY Left 08/28/2011  . TMJ ARTHROPLASTY  1982  . TOTAL HIP ARTHROPLASTY Left 2002  . TRIGGER FINGER RELEASE Right 05/15/2011   long finger    Allergies  Allergen Reactions  . Cephalexin Hives  .  Hydrocodone Other (See Comments)    Reaction:  Hallucinations   . Risperidone And Related Other (See Comments)    Reaction:  Made pt excessively sleepy  . Seroquel [Quetiapine Fumarate] Other (See Comments)    Reaction:  Made pt excessively sleepy  . Sulfa Antibiotics Rash    Social History   Tobacco Use  . Smoking status: Former Smoker    Packs/day: 1.50    Years: 40.00    Pack years: 60.00    Types: Cigarettes    Quit date: 04/29/2009    Years since quitting: 9.9  . Smokeless tobacco: Never Used  Substance Use Topics  . Alcohol use: Yes    Alcohol/week: 21.0 standard drinks    Types: 21 Shots of liquor per week    Comment: 3 shots/per night    Family History  Problem Relation Age of Onset  . Aneurysm Father 62       brain  . Alcohol abuse Father    . Cancer Father        possibly  . CAD Other        several siblings  . Cancer Brother        prostate  . Diabetes Brother   . Diabetes Sister   . Anesthesia problems Neg Hx   . Hypotension Neg Hx   . Malignant hyperthermia Neg Hx   . Pseudochol deficiency Neg Hx    Prior to Admission medications   Medication Sig Start Date End Date Taking? Authorizing Provider  albuterol (PROVENTIL HFA;VENTOLIN HFA) 108 (90 Base) MCG/ACT inhaler Inhale 2 puffs into the lungs every 6 (six) hours as needed for wheezing or shortness of breath. 12/14/18  Yes [name], MD  aspirin 81 MG EC tablet Take 81 mg by mouth at bedtime.    Yes [provider]  celecoxib (CELEBREX) 200 MG capsule Take 1 capsule (200 mg total) by mouth daily. 11/19/18  Yes [name], MD  doxycycline (VIBRA-TABS) 100 MG tablet TAKE 1 TABLET (100 MG TOTAL) BY MOUTH AT BEDTIME. Patient taking differently: Take 100 mg by mouth at bedtime.  01/25/19  Yes [name], MD  fluconazole (DIFLUCAN) 150 MG tablet TAKE 1 TABLET BY MOUTH ONCE FOR YEAST INFECTION REPEAT IN 72 HOURS.AS NEEDED Patient taking differently: Take 150 mg by mouth every 3 (three) days.  03/10/19  Yes [name], MD  Fluticasone-Salmeterol (ADVAIR) 250-50 MCG/DOSE AEPB Inhale 2 puffs into the lungs at bedtime.    Yes [provider]  furosemide (LASIX) 40 MG tablet Take 40 mg by mouth daily.  02/03/17  Yes [name], MD  lamoTRIgine (LAMICTAL) 200 MG tablet Take 200 mg by mouth daily.     Yes [provider]  levothyroxine (SYNTHROID) 150 MCG tablet Take 1 tablet (150 mcg total) by mouth at bedtime. 01/21/19  Yes [name], MD  losartan (COZAAR) 100 MG tablet Take 1 tablet (100 mg total) by mouth daily. 01/21/19  Yes [name], MD  montelukast (SINGULAIR) 10 MG tablet TAKE 1 TABLET BY MOUTH EVERYDAY AT BEDTIME Patient taking differently: Take 10 mg by mouth at bedtime.  02/28/19  Yes [name],  MD  Multiple Vitamin (MULTIVITAMIN WITH MINERALS) TABS tablet Take 2 tablets by mouth daily.    Yes [provider]  nystatin (MYCOSTATIN/NYSTOP) powder Apply topically 4 (four) times daily. 02/22/19  Yes Reva Bores, MD  Oxcarbazepine (TRILEPTAL) 300 MG tablet Take 300-600 mg by mouth See admin instructions. Take 300 mg by mouth in the  morning and 600 mg at night   Yes [provider]  oxybutynin (DITROPAN XL) 10 MG 24 hr tablet Take 1 tablet (10 mg total) by mouth at bedtime. 03/07/19  Yes Jerilee FieldEskridge, Matthew, MD  pantoprazole (PROTONIX) 40 MG tablet Take 1 tablet (40 mg total) by mouth daily. 01/21/19  Yes Eustaquio BoydenGutierrez, Javier, MD  simvastatin (ZOCOR) 40 MG tablet Take 1 tablet (40 mg total) by mouth every evening. 01/21/19  Yes Eustaquio BoydenGutierrez, Javier, MD  traMADol (ULTRAM) 50 MG tablet Take 50 mg by mouth every 6 (six) hours as needed for moderate pain.  02/08/19  Yes [provider]  cyclobenzaprine (FLEXERIL) 10 MG tablet Take 1 tablet (10 mg total) by mouth 3 (three) times daily as needed for muscle spasms. 11/14/16   Tressie StalkerJenkins, Liel Rudden, MD  ondansetron (ZOFRAN) 8 MG tablet Take 8 mg by mouth every 8 (eight) hours as needed for nausea or vomiting.     [provider]  oxyCODONE-acetaminophen (PERCOCET) 5-325 MG tablet 1-2 tabs po q6 hours prn pain Patient not taking: Reported on 03/14/2019 12/24/17   Betha LoaKuzma, Kevin, MD  promethazine (PHENERGAN) 25 MG tablet Take 25 mg by mouth every 6 (six) hours as needed for nausea or vomiting.    [provider]     Review of Systems  Positive ROS: As above  All other systems have been reviewed and were otherwise negative with the exception of those mentioned in the HPI and as above.  Objective: Vital signs in last 24 hours: Temp:  [98.2 F (36.8 C)] 98.2 F (36.8 C) (06/24 1037) Pulse Rate:  [73] 73 (06/24 1040) BP: (166)/(40) 166/40 (06/24 1037) SpO2:  [98 %] 98 % (06/24 1040) Weight:  [168.4 kg] 168.4 kg (06/24  1037) Estimated body mass index is 63.72 kg/m as calculated from the following:   Height as of this encounter: 5\' 4"  (1.626 m).   Weight as of this encounter: 168.4 kg.   General Appearance: Alert, pleasant, morbidly obese Head: Normocephalic, without obvious abnormality, atraumatic Eyes: PERRL, conjunctiva/corneas clear, EOM's intact,    Ears: Normal  Throat: Normal  Neck: Supple, Back: The patient's lumbar incision is well-healed. Lungs: Clear to auscultation bilaterally, respirations unlabored Heart: Regular rate and rhythm, no murmur, rub or gallop Abdomen: Soft, non-tender Extremities: Extremities normal, atraumatic, no cyanosis or edema Skin: unremarkable  NEUROLOGIC:   Mental status: alert and oriented,Motor Exam - grossly normal Sensory Exam - grossly normal Reflexes:  Coordination - grossly normal Gait - grossly normal Balance - grossly normal Cranial Nerves: I: smell Not tested  II: visual acuity  OS: Normal  OD: Normal   II: visual fields Full to confrontation  II: pupils Equal, round, reactive to light  III,VII: ptosis None  III,IV,VI: extraocular muscles  Full ROM  V: mastication Normal  V: facial light touch sensation  Normal  V,VII: corneal reflex  Present  VII: facial muscle function - upper  Normal  VII: facial muscle function - lower Normal  VIII: hearing Not tested  IX: soft palate elevation  Normal  IX,X: gag reflex Present  XI: trapezius strength  5/5  XI: sternocleidomastoid strength 5/5  XI: neck flexion strength  5/5  XII: tongue strength  Normal    Data Review Lab Results  Component Value Date   WBC 8.1 03/18/2019   HGB 13.2 03/18/2019   HCT 40.3 03/18/2019   MCV 89.6 03/18/2019   PLT 254 03/18/2019   Lab Results  Component Value Date   NA 133 (L)  03/18/2019   K 4.0 03/18/2019   CL 96 (L) 03/18/2019   CO2 26 03/18/2019   BUN 15 03/18/2019   CREATININE 0.65 03/18/2019   GLUCOSE 123 (H) 03/18/2019   Lab Results  Component  Value Date   INR 1.18 08/31/2012    Assessment/Plan: L3-4 spondylolisthesis, spinal stenosis, lumbago, lumbar radiculopathy, lumbar pseudoarthrosis: I have discussed the situation with the patient.  I reviewed her imaging studies with her and pointed out the abnormalities.  We have discussed the various treatment options including surgery.  I have described the surgical treatment of an exploration of her lumbar fusion with an L3-4 decompression and instrumentation fusion from L3-L5.  I have described the surgery to her.  I have shown her surgical models.  We have discussed the risks, benefits, alternatives, expected postoperative course, and likelihood of achieving our goals with surgery.  I have answered all her questions.  She has decided proceed with surgery.   Ophelia Charter 03/23/2019 11:25 AM

## 2019-03-24 LAB — HEMOGLOBIN A1C
Hgb A1c MFr Bld: 6.5 % — ABNORMAL HIGH (ref 4.8–5.6)
Mean Plasma Glucose: 139.85 mg/dL

## 2019-03-24 LAB — BASIC METABOLIC PANEL
Anion gap: 7 (ref 5–15)
BUN: 14 mg/dL (ref 8–23)
CO2: 27 mmol/L (ref 22–32)
Calcium: 8.7 mg/dL — ABNORMAL LOW (ref 8.9–10.3)
Chloride: 97 mmol/L — ABNORMAL LOW (ref 98–111)
Creatinine, Ser: 0.73 mg/dL (ref 0.44–1.00)
GFR calc Af Amer: >60 mL/min (ref >60)
GFR calc non Af Amer: >60 mL/min (ref >60)
Glucose, Bld: 172 mg/dL — ABNORMAL HIGH (ref 70–99)
Potassium: 4.6 mmol/L (ref 3.5–5.1)
Sodium: 131 mmol/L — ABNORMAL LOW (ref 135–145)

## 2019-03-24 LAB — GLUCOSE, CAPILLARY
Glucose-Capillary: 165 mg/dL — ABNORMAL HIGH (ref 70–99)
Glucose-Capillary: 144 mg/dL — ABNORMAL HIGH (ref 70–99)
Glucose-Capillary: 149 mg/dL — ABNORMAL HIGH (ref 70–99)
Glucose-Capillary: 175 mg/dL — ABNORMAL HIGH (ref 70–99)
Glucose-Capillary: 169 mg/dL — ABNORMAL HIGH (ref 70–99)

## 2019-03-24 LAB — CBC
HCT: 32.7 % — ABNORMAL LOW (ref 36.0–46.0)
Hemoglobin: 10.6 g/dL — ABNORMAL LOW (ref 12.0–15.0)
MCH: 29 pg (ref 26.0–34.0)
MCHC: 32.4 g/dL (ref 30.0–36.0)
MCV: 89.6 fL (ref 80.0–100.0)
Platelets: 204 10*3/uL (ref 150–400)
RBC: 3.65 MIL/uL — ABNORMAL LOW (ref 3.87–5.11)
RDW: 14.6 % (ref 11.5–15.5)
WBC: 9.9 10*3/uL (ref 4.0–10.5)
nRBC: 0 % (ref 0.0–0.2)

## 2019-03-24 MED ORDER — INSULIN ASPART 100 UNIT/ML ~~LOC~~ SOLN
0.0000 [IU] | Freq: Three times a day (TID) | SUBCUTANEOUS | Status: DC
  Administered 2019-03-24: 3 [IU] via SUBCUTANEOUS
  Administered 2019-03-24: 4 [IU] via SUBCUTANEOUS
  Administered 2019-03-25 (×2): 3 [IU] via SUBCUTANEOUS
  Administered 2019-03-25: 4 [IU] via SUBCUTANEOUS

## 2019-03-24 MED ORDER — INSULIN ASPART 100 UNIT/ML ~~LOC~~ SOLN
0.0000 [IU] | SUBCUTANEOUS | Status: DC
  Administered 2019-03-24: 4 [IU] via SUBCUTANEOUS

## 2019-03-24 MED FILL — Gelatin Absorbable MT Powder: OROMUCOSAL | Qty: 1 | Status: AC

## 2019-03-24 MED FILL — Thrombin For Soln 5000 Unit: CUTANEOUS | Qty: 5000 | Status: AC

## 2019-03-24 NOTE — Progress Notes (Signed)
Subjective: The patient is alert and pleasant.  Her back is sore.  She looks well.  She wants to go home tomorrow.  Objective: Vital signs in last 24 hours: Temp:  [97.2 F (36.2 C)-99.4 F (37.4 C)] 98.1 F (36.7 C) (06/25 0804) Pulse Rate:  [73-95] 94 (06/25 0804) Resp:  [13-20] 18 (06/25 0804) BP: (140-169)/(40-87) 140/55 (06/25 0804) SpO2:  [93 %-99 %] 93 % (06/25 0804) Weight:  [168.4 kg] 168.4 kg (06/24 1037) Estimated body mass index is 63.72 kg/m as calculated from the following:   Height as of this encounter: 5\' 4"  (1.626 m).   Weight as of this encounter: 168.4 kg.   Intake/Output from previous day: 06/24 0701 - 06/25 0700 In: 1700 [I.V.:1400; IV Piggyback:250] Out: 625 [Urine:275; Blood:350] Intake/Output this shift: No intake/output data recorded.  Physical exam the patient is alert and oriented.  She is moving her lower extremities well.  Lab Results: Recent Labs    03/24/19 0613  WBC 9.9  HGB 10.6*  HCT 32.7*  PLT 204   BMET Recent Labs    03/24/19 0613  NA 131*  K 4.6  CL 97*  CO2 27  GLUCOSE 172*  BUN 14  CREATININE 0.73  CALCIUM 8.7*    Studies/Results: Dg Lumbar Spine 2-3 Views  Result Date: 03/23/2019 CLINICAL DATA:  Posterior lumbar interbody fusion L4-L5 EXAM: DG C-ARM 61-120 MIN; LUMBAR SPINE - 2-3 VIEW COMPARISON:  MRI 09/15/2018, radiograph 08/31/2018 FINDINGS: Two low resolution intraoperative spot views of the lumbar spine. Total fluoroscopy time was 27 seconds. Submitted images demonstrate removal of previously noted posterior spinal rods at L3-L4 and left fixating screw at L3. Placement of fixating screws at what appears to be L5 with interbody device at presumed L4-L5 level. IMPRESSION: Intraoperative fluoroscopic assistance provided during lumbar spine surgery Electronically Signed   By: Donavan Foil M.D.   On: 03/23/2019 17:25   Dg C-arm 1-60 Min  Result Date: 03/23/2019 CLINICAL DATA:  Posterior lumbar interbody fusion L4-L5  EXAM: DG C-ARM 61-120 MIN; LUMBAR SPINE - 2-3 VIEW COMPARISON:  MRI 09/15/2018, radiograph 08/31/2018 FINDINGS: Two low resolution intraoperative spot views of the lumbar spine. Total fluoroscopy time was 27 seconds. Submitted images demonstrate removal of previously noted posterior spinal rods at L3-L4 and left fixating screw at L3. Placement of fixating screws at what appears to be L5 with interbody device at presumed L4-L5 level. IMPRESSION: Intraoperative fluoroscopic assistance provided during lumbar spine surgery Electronically Signed   By: Donavan Foil M.D.   On: 03/23/2019 17:25    Assessment/Plan: Postop day 1: The patient is doing well.  We will mobilize her with PT and OT.  She will likely go home tomorrow.  Hyponatremia: Noted I will repeat her sodium tomorrow.  Hyperglycemia: I will add sliding scale insulin.  LOS: 1 day     Ophelia Charter 03/24/2019, 8:11 AM

## 2019-03-24 NOTE — Progress Notes (Signed)
RT NOTE: RT arrived to patient's room to give [facility]. RT placed pulse ox on patient's finger and pt was sating 85% on RA. RT placed pt on 4L nasal cannula and pt sat increased to 93%. RT will continue to monitor as needed.

## 2019-03-24 NOTE — Evaluation (Signed)
Physical Therapy Evaluation Patient Details Name: Amy Barnes MRN: [id number] DOB: August 15, 1951 Today's Date: 03/24/2019   History of Present Illness  Pt is a 68 yo female s/p L3-4 decompression and instrumentation and fusion of L3-5. PMHx: back and leg pain, obesity, DM, anxiety, bipolar d/o.    Clinical Impression  Pt admitted with above diagnosis. Pt currently with functional limitations due to the deficits listed below (see PT Problem List). PTA, pt living at home with husband and other family members who can assist after d/c, reports mod I with mobility. Today, patient lethargic likely due to pain medication, abe to ambulate 20' with close guarding. Will cont to follow. Pt will benefit from skilled PT to increase their independence and safety with mobility to allow discharge to the venue listed below.       Follow Up Recommendations Follow surgeon's recommendation for DC plan and follow-up therapies    Equipment Recommendations  Rolling walker with 5" wheels(pt states she has one- please confirm due to medications )    Recommendations for Other Services       Precautions / Restrictions Precautions Precautions: Back Precaution Booklet Issued: Yes (comment) Precaution Comments: verbal discussion of back precautions Restrictions Weight Bearing Restrictions: No      Mobility  Bed Mobility Overal bed mobility: Needs Assistance Bed Mobility: Supine to Sit;Sit to Sidelying     Supine to sit: Supervision   Sit to sidelying: Min assist General bed mobility comments: MinA for LB getting back on bed, supervision for cues regarding log roll technique.  Transfers Overall transfer level: Needs assistance   Transfers: Sit to/from Stand Sit to Stand: Min guard         General transfer comment: for safety and for handing SPC to pt upon standing  Ambulation/Gait Ambulation/Gait assistance: Min guard Gait Distance (Feet): 20 Feet Assistive device: Rolling walker (2  wheeled) Gait Pattern/deviations: Step-to pattern;Step-through pattern Gait velocity: decreased   General Gait Details: pt drowsy this visit, guard for safety. good use of RW, very slow and lethargic this visit.   Stairs            Wheelchair Mobility    Modified Rankin (Stroke Patients Only)       Balance Overall balance assessment: Needs assistance Sitting-balance support: Bilateral upper extremity supported Sitting balance-Leahy Scale: Fair     Standing balance support: Bilateral upper extremity supported Standing balance-Leahy Scale: Fair                               Pertinent Vitals/Pain Pain Assessment: Faces Pain Score: 9  Faces Pain Scale: Hurts whole lot Pain Location: low back Pain Descriptors / Indicators: Discomfort Pain Intervention(s): Limited activity within patient's tolerance;Monitored during session;Premedicated before session    Home Living Family/patient expects to be discharged to:: Private residence Living Arrangements: Spouse/significant other;Children Available Help at Discharge: Family;Available 24 hours/day Type of Home: House Home Access: Ramped entrance     Home Layout: One level Home Equipment: Cane - single point;Walker - 4 wheels;Adaptive equipment;Shower seat;Bedside commode      Prior Function Level of Independence: Independent with assistive device(s)         Comments: Pt using AE for LB ADL tasks     Hand Dominance   Dominant Hand: Right    Extremity/Trunk Assessment   Upper Extremity Assessment Upper Extremity Assessment: Overall WFL for tasks assessed    Lower Extremity Assessment Lower Extremity Assessment: Overall [facility]  for tasks assessed;Generalized weakness    Cervical / Trunk Assessment Cervical / Trunk Assessment: Other exceptions Cervical / Trunk Exceptions: s/p back sx  Communication   Communication: No difficulties  Cognition Arousal/Alertness: Awake/alert Behavior During Therapy:  WFL for tasks assessed/performed Overall Cognitive Status: Within Functional Limits for tasks assessed                                        General Comments General comments (skin integrity, edema, etc.): Pt reports that her granddaughter will stay with her during the day and additional family for 24/7 care.    Exercises     Assessment/Plan    PT Assessment Patient needs continued PT services  PT Problem List Decreased strength       PT Treatment Interventions DME instruction;Gait training;Stair training;Functional mobility training;Therapeutic activities;Therapeutic exercise;Balance training    PT Goals (Current goals can be found in the Care Plan section)  Acute Rehab PT Goals Patient Stated Goal: to reduce the pain    Frequency Min 5X/week   Barriers to discharge        Co-evaluation               AM-PAC PT "6 Clicks" Mobility  Outcome Measure Help needed turning from your back to your side while in a flat bed without using bedrails?: None Help needed moving from lying on your back to sitting on the side of a flat bed without using bedrails?: A Little Help needed moving to and from a bed to a chair (including a wheelchair)?: A Little Help needed standing up from a chair using your arms (e.g., wheelchair or bedside chair)?: A Little Help needed to walk in hospital room?: A Little Help needed climbing 3-5 steps with a railing? : A Little 6 Click Score: 19    End of Session Equipment Utilized During Treatment: Gait belt Activity Tolerance: Patient tolerated treatment well Patient left: in bed Nurse Communication: Mobility status PT Visit Diagnosis: Unsteadiness on feet (R26.81)    Time: 6160-7371 PT Time Calculation (min) (ACUTE ONLY): 26 min   Charges:   PT Evaluation $PT Eval Moderate Complexity: 1 Mod PT Treatments $Gait Training: 8-22 mins        Reinaldo Berber, PT, DPT Acute Rehabilitation Services Pager:  [phone] Office: [place] 03/24/2019, 11:02 AM

## 2019-03-24 NOTE — Care Management (Signed)
Spoke w patient at bedside. She states that she lives at home w her spouse, and if [facility] is needed she would defer choice to him. She said she was told about [facility] companies that she should try but she could not remember the names and he may. She states she has cane, rollator, rw, and shower seat.  MD- please clarify if [place] needed. Patient will need [place] orders with face to face.

## 2019-03-25 ENCOUNTER — Inpatient Hospital Stay: Payer: Self-pay

## 2019-03-25 DIAGNOSIS — Z981 Arthrodesis status: Secondary | ICD-10-CM

## 2019-03-25 DIAGNOSIS — Z885 Allergy status to narcotic agent status: Secondary | ICD-10-CM

## 2019-03-25 DIAGNOSIS — M4316 Spondylolisthesis, lumbar region: Secondary | ICD-10-CM

## 2019-03-25 DIAGNOSIS — M48062 Spinal stenosis, lumbar region with neurogenic claudication: Secondary | ICD-10-CM

## 2019-03-25 DIAGNOSIS — M5416 Radiculopathy, lumbar region: Secondary | ICD-10-CM

## 2019-03-25 DIAGNOSIS — Z888 Allergy status to other drugs, medicaments and biological substances status: Secondary | ICD-10-CM

## 2019-03-25 DIAGNOSIS — T8463XA Infection and inflammatory reaction due to internal fixation device of spine, initial encounter: Secondary | ICD-10-CM

## 2019-03-25 DIAGNOSIS — B9562 Methicillin resistant Staphylococcus aureus infection as the cause of diseases classified elsewhere: Secondary | ICD-10-CM

## 2019-03-25 DIAGNOSIS — Z87891 Personal history of nicotine dependence: Secondary | ICD-10-CM

## 2019-03-25 DIAGNOSIS — Z881 Allergy status to other antibiotic agents status: Secondary | ICD-10-CM

## 2019-03-25 DIAGNOSIS — M8448XK Pathological fracture, other site, subsequent encounter for fracture with nonunion: Secondary | ICD-10-CM

## 2019-03-25 LAB — GLUCOSE, CAPILLARY
Glucose-Capillary: 175 mg/dL — ABNORMAL HIGH (ref 70–99)
Glucose-Capillary: 122 mg/dL — ABNORMAL HIGH (ref 70–99)
Glucose-Capillary: 146 mg/dL — ABNORMAL HIGH (ref 70–99)

## 2019-03-25 LAB — BASIC METABOLIC PANEL
Anion gap: 10 (ref 5–15)
BUN: 9 mg/dL (ref 8–23)
CO2: 24 mmol/L (ref 22–32)
Calcium: 9 mg/dL (ref 8.9–10.3)
Chloride: 93 mmol/L — ABNORMAL LOW (ref 98–111)
Creatinine, Ser: 0.71 mg/dL (ref 0.44–1.00)
GFR calc Af Amer: >60 mL/min (ref >60)
GFR calc non Af Amer: >60 mL/min (ref >60)
Glucose, Bld: 177 mg/dL — ABNORMAL HIGH (ref 70–99)
Potassium: 4.2 mmol/L (ref 3.5–5.1)
Sodium: 127 mmol/L — ABNORMAL LOW (ref 135–145)

## 2019-03-25 MED ORDER — OXYCODONE HCL 10 MG PO TABS: 10.0000 mg | ORAL_TABLET | ORAL | 0 refills | Status: DC | PRN

## 2019-03-25 MED ORDER — VANCOMYCIN IV (FOR PTA / DISCHARGE USE ONLY): 2250.0000 mg | INTRAVENOUS | 0 refills | Status: AC

## 2019-03-25 MED ORDER — RIFAMPIN 300 MG PO CAPS
300.0000 mg | ORAL_CAPSULE | Freq: Two times a day (BID) | ORAL | Status: DC
  Administered 2019-03-25: 300 mg via ORAL
  Filled 2019-03-25 (×2): qty 1

## 2019-03-25 MED ORDER — VANCOMYCIN HCL 10 G IV SOLR
1500.0000 mg | Freq: Two times a day (BID) | INTRAVENOUS | Status: DC
  Filled 2019-03-25: qty 1500

## 2019-03-25 MED ORDER — RIFAMPIN 300 MG PO CAPS: 300.0000 mg | ORAL_CAPSULE | Freq: Two times a day (BID) | ORAL | 0 refills | Status: DC

## 2019-03-25 MED ORDER — SODIUM CHLORIDE 0.9% FLUSH
10.0000 mL | INTRAVENOUS | Status: DC | PRN
  Administered 2019-03-25: 10 mL
  Filled 2019-03-25: qty 40

## 2019-03-25 MED ORDER — DOCUSATE SODIUM 100 MG PO CAPS: 100.0000 mg | ORAL_CAPSULE | Freq: Two times a day (BID) | ORAL | 0 refills | Status: DC

## 2019-03-25 MED ORDER — HEPARIN SOD (PORK) LOCK FLUSH 100 UNIT/ML IV SOLN
250.0000 [IU] | INTRAVENOUS | Status: AC | PRN
  Administered 2019-03-25: 250 [IU]

## 2019-03-25 MED ORDER — METHOCARBAMOL 750 MG PO TABS: 750.0000 mg | ORAL_TABLET | Freq: Four times a day (QID) | ORAL | 0 refills | Status: DC | PRN

## 2019-03-25 MED ORDER — VANCOMYCIN HCL 10 G IV SOLR
2250.0000 mg | INTRAVENOUS | Status: DC
  Administered 2019-03-25: 2250 mg via INTRAVENOUS
  Filled 2019-03-25: qty 2000

## 2019-03-25 MED FILL — Sodium Chloride IV Soln 0.9%: INTRAVENOUS | Qty: 1000 | Status: AC

## 2019-03-25 MED FILL — Heparin Sodium (Porcine) Inj 1000 Unit/ML: INTRAMUSCULAR | Qty: 30 | Status: AC

## 2019-03-25 NOTE — Progress Notes (Signed)
PHARMACY CONSULT NOTE FOR:  OUTPATIENT  PARENTERAL ANTIBIOTIC THERAPY (OPAT)  Indication: MRSA Wound Infection  Regimen: Vancomycin 2250mg  q 24 hr  End date: 05/19/2019  IV antibiotic discharge orders are pended. To discharging provider:  please sign these orders via discharge navigator,  Select New Orders & click on the button choice - Manage This Unsigned Work.     Thank you for allowing pharmacy to be a part of this patient's care.  Azzie Roup D PGY1 Pharmacy Resident  Phone [phone] Please use AMION for clinical pharmacists numbers  03/25/2019      11:38 AM

## 2019-03-25 NOTE — Progress Notes (Addendum)
Pharmacy Antibiotic Note  Amy Barnes is a 68 y.o. female admitted on 03/23/2019 for a lumbar decompression, instrumentation and fusion. During the procedure infection was noted and wound culture was taken. Patient with MRSA infection at surgical site.  Pharmacy has been consulted for vancomycin dosing.  Patient stable at Scr 0.71, WBC 9.9 and remains afebrile.   Plan: Vancomycin 2250 mg IV Q 24 hrs. Goal AUC 400-550. Expected AUC: 496 SCr used: 0.8 Planned 8 weeks of therapy Monitor renal function and vancomycin levels as indicated   Height: 5\' 4"  (162.6 cm) Weight: (!) 371 lb 3.2 oz (168.4 kg) IBW/kg (Calculated) : 54.7  Temp (24hrs), Avg:98.1 F (36.7 C), Min:97.5 F (36.4 C), Max:98.7 F (37.1 C)  Recent Labs  Lab 03/18/19 1530 03/24/19 0613 03/25/19 0610  WBC 8.1 9.9  --   CREATININE 0.65 0.73 0.71    Estimated Creatinine Clearance: 109.4 mL/min (by C-G formula based on SCr of 0.71 mg/dL).    Allergies  Allergen Reactions  . Cephalexin Hives  . Hydrocodone Other (See Comments)    Reaction:  Hallucinations   . Risperidone And Related Other (See Comments)    Reaction:  Made pt excessively sleepy  . Seroquel [Quetiapine Fumarate] Other (See Comments)    Reaction:  Made pt excessively sleepy  . Sulfa Antibiotics Rash    Antimicrobials this admission: 6/26 vancomycin >>   Dose adjustments this admission: n/a  Microbiology results: 6/24 Wound Cx: MRSA  Thank you for allowing pharmacy to be a part of this patient's care.  Azzie Roup D PGY1 Pharmacy Resident  Phone [phone] Please use AMION for clinical pharmacists numbers  03/25/2019      11:27 AM

## 2019-03-25 NOTE — Progress Notes (Signed)
Physical Therapy Treatment Patient Details Name: Amy Barnes MRN: [id number] DOB: 04-23-1951 Today's Date: 03/25/2019    History of Present Illness Pt is a 68 yo female s/p L3-4 decompression and instrumentation and fusion of L3-5. PMHx: back and leg pain, obesity, DM, anxiety, bipolar d/o.    PT Comments    Pt more alert this AM, pain limiting, tearful during visit. Reinforced log roll technique and pt tolerating standing, limited gait due to high pain.      Follow Up Recommendations  Follow surgeon's recommendation for DC plan and follow-up therapies     Equipment Recommendations  Rolling walker with 5" wheels(pt states she has one- please confirm due to medications )    Recommendations for Other Services       Precautions / Restrictions Precautions Precautions: Back Precaution Booklet Issued: Yes (comment) Precaution Comments: verbal discussion of back precautions Required Braces or Orthoses: Spinal Brace Spinal Brace: Lumbar corset;Applied in sitting position Restrictions Weight Bearing Restrictions: No    Mobility  Bed Mobility Overal bed mobility: Needs Assistance Bed Mobility: Supine to Sit;Sit to Sidelying     Supine to sit: Supervision   Sit to sidelying: Min assist General bed mobility comments: MinA for LB getting back on bed, supervision for cues regarding log roll technique.  Transfers Overall transfer level: Needs assistance Equipment used: Rolling walker (2 wheeled);Straight cane Transfers: Sit to/from Stand Sit to Stand: Min guard         General transfer comment: for safety and for handing SPC to pt upon standing  Ambulation/Gait                 Stairs             Wheelchair Mobility    Modified Rankin (Stroke Patients Only)       Balance Overall balance assessment: Needs assistance Sitting-balance support: Bilateral upper extremity supported Sitting balance-Leahy Scale: Fair     Standing balance support:  Bilateral upper extremity supported Standing balance-Leahy Scale: Fair Standing balance comment: statically                            Cognition Arousal/Alertness: Awake/alert Behavior During Therapy: WFL for tasks assessed/performed Overall Cognitive Status: Within Functional Limits for tasks assessed                                 General Comments: pt much more alert      Exercises      General Comments        Pertinent Vitals/Pain Pain Assessment: Faces Faces Pain Scale: Hurts whole lot Pain Location: low back Pain Descriptors / Indicators: Discomfort Pain Intervention(s): Monitored during session;Premedicated before session;Repositioned    Home Living                      Prior Function            PT Goals (current goals can now be found in the care plan section) Acute Rehab PT Goals Patient Stated Goal: to reduce the pain Progress towards PT goals: Progressing toward goals    Frequency    Min 5X/week      PT Plan      Co-evaluation              AM-PAC PT "6 Clicks" Mobility   Outcome Measure  Help needed turning from your back  to your side while in a flat bed without using bedrails?: None Help needed moving from lying on your back to sitting on the side of a flat bed without using bedrails?: A Little Help needed moving to and from a bed to a chair (including a wheelchair)?: A Little Help needed standing up from a chair using your arms (e.g., wheelchair or bedside chair)?: A Little Help needed to walk in hospital room?: A Little Help needed climbing 3-5 steps with a railing? : A Little 6 Click Score: 19    End of Session Equipment Utilized During Treatment: Gait belt Activity Tolerance: Patient tolerated treatment well Patient left: in bed Nurse Communication: Mobility status PT Visit Diagnosis: Unsteadiness on feet (R26.81)     Time: 8270-7867 PT Time Calculation (min) (ACUTE ONLY): 20  min  Charges:  $Therapeutic Activity: 8-22 mins                     Etta Grandchild, PT, DPT Acute Rehabilitation Services Pager: [phone] Office: [phone]     Etta Grandchild 03/25/2019, 10:29 AM

## 2019-03-25 NOTE — Progress Notes (Signed)
Subjective: The patient is alert and pleasant.  She wants to go home.  Objective: Vital signs in last 24 hours: Temp:  [97.5 F (36.4 C)-98.7 F (37.1 C)] 98.1 F (36.7 C) (06/26 0410) Pulse Rate:  [75-96] 91 (06/26 0410) Resp:  [17-20] 20 (06/26 0410) BP: (122-165)/(55-87) 122/87 (06/26 0410) SpO2:  [85 %-99 %] 97 % (06/26 0410) Estimated body mass index is 63.72 kg/m as calculated from the following:   Height as of this encounter: 5\' 4"  (1.626 m).   Weight as of this encounter: 168.4 kg.   Intake/Output from previous day: 06/25 0701 - 06/26 0700 In: 840 [P.O.:840] Out: -  Intake/Output this shift: No intake/output data recorded.  Physical exam the patient is alert and pleasant.  She is moving her lower extremities well.  Her dressing is tattered.  She does not have any drainage.  There is some peri-incisional ecchymosis.  Her wound cultures have grown staph aureus.  Lab Results: Recent Labs    03/24/19 0613  WBC 9.9  HGB 10.6*  HCT 32.7*  PLT 204   BMET Recent Labs    03/24/19 0613  NA 131*  K 4.6  CL 97*  CO2 27  GLUCOSE 172*  BUN 14  CREATININE 0.73  CALCIUM 8.7*    Studies/Results: Dg Lumbar Spine 2-3 Views  Result Date: 03/23/2019 CLINICAL DATA:  Posterior lumbar interbody fusion L4-L5 EXAM: DG C-ARM 61-120 MIN; LUMBAR SPINE - 2-3 VIEW COMPARISON:  MRI 09/15/2018, radiograph 08/31/2018 FINDINGS: Two low resolution intraoperative spot views of the lumbar spine. Total fluoroscopy time was 27 seconds. Submitted images demonstrate removal of previously noted posterior spinal rods at L3-L4 and left fixating screw at L3. Placement of fixating screws at what appears to be L5 with interbody device at presumed L4-L5 level. IMPRESSION: Intraoperative fluoroscopic assistance provided during lumbar spine surgery Electronically Signed   By: Donavan Foil M.D.   On: 03/23/2019 17:25   Dg C-arm 1-60 Min  Result Date: 03/23/2019 CLINICAL DATA:  Posterior lumbar  interbody fusion L4-L5 EXAM: DG C-ARM 61-120 MIN; LUMBAR SPINE - 2-3 VIEW COMPARISON:  MRI 09/15/2018, radiograph 08/31/2018 FINDINGS: Two low resolution intraoperative spot views of the lumbar spine. Total fluoroscopy time was 27 seconds. Submitted images demonstrate removal of previously noted posterior spinal rods at L3-L4 and left fixating screw at L3. Placement of fixating screws at what appears to be L5 with interbody device at presumed L4-L5 level. IMPRESSION: Intraoperative fluoroscopic assistance provided during lumbar spine surgery Electronically Signed   By: Donavan Foil M.D.   On: 03/23/2019 17:25    Assessment/Plan: Postop day #2: The patient is progressing well.  I gave her discharge instructions and answered all her questions.  Staph aureus wound infection: At surgery there was some purulent material around the left screw heads.  Cultures have grown staph aureus.  This is a bit perplexing since her last surgery was a couple years ago and original fusion was about 8 years ago.  She did have an injection earlier this year.  None the less, ask ID to help [place] with selection of antibiotics and length of treatment.  LOS: 2 days     Ophelia Charter 03/25/2019, 6:56 AM

## 2019-03-25 NOTE — Progress Notes (Signed)
Occupational Therapy Treatment Patient Details Name: Amy Barnes MRN: [id number] DOB: [birth year] Today's Date: 03/25/2019    History of present illness Pt is a 68 yo female s/p L3-4 decompression and instrumentation and fusion of L3-5. PMHx: back and leg pain, obesity, DM, anxiety, bipolar d/o.   OT comments  Reinforced back precautions during ADL and use of AE for LB bathing and dressing. Pt verbalizing understanding. Has a supportive family to assist. Pt is much more alert, but pain remains high. She is eager to go home.  Follow Up Recommendations  Home health OT;Supervision/Assistance - 24 hour    Equipment Recommendations  None recommended by OT    Recommendations for Other Services      Precautions / Restrictions Precautions Precautions: Back;Fall Precaution Booklet Issued: Yes (comment) Precaution Comments: reinforced back precautions Required Braces or Orthoses: Spinal Brace Spinal Brace: Lumbar corset;Applied in sitting position       Mobility Bed Mobility Overal bed mobility: Needs Assistance Bed Mobility: Sit to Sidelying         Sit to sidelying: Mod assist General bed mobility comments: assist for LEs  Transfers Overall transfer level: Needs assistance Equipment used: Rolling walker (2 wheeled);Straight cane Transfers: Sit to/from Stand Sit to Stand: Min guard         General transfer comment: slow, but no assist    Balance Overall balance assessment: Needs assistance Sitting-balance support: Bilateral upper extremity supported Sitting balance-Leahy Scale: Fair       Standing balance-Leahy Scale: Fair Standing balance comment: statically                           ADL either performed or assessed with clinical judgement   ADL Overall ADL's : Needs assistance/impaired Eating/Feeding: Independent;Sitting     Grooming Details (indicate cue type and reason): educated in 2 cup method for tooth brushing and use of washcloth for face  instead of bending over sink Upper Body Bathing: Minimal assistance;Sitting Upper Body Bathing Details (indicate cue type and reason): recommended long handled bath sponge for back Lower Body Bathing: Moderate assistance;Sit to/from stand Lower Body Bathing Details (indicate cue type and reason): recommendend long handled bath sponge and use of reacher Upper Body Dressing : Set up;Sitting Upper Body Dressing Details (indicate cue type and reason): min for back brace Lower Body Dressing: Minimal assistance;With adaptive equipment;Sit to/from stand Lower Body Dressing Details (indicate cue type and reason): educated pt in use of reacher to don pants starting with L LE first Toilet Transfer: Min guard;Ambulation;RW     Toileting - Clothing Manipulation Details (indicate cue type and reason): pt has a toilet aid, reminded not to twist during pericare     Functional mobility during ADLs: Min guard;Rolling walker General ADL Comments: pt does not do housekeeping or heavy meal prep, relies on family     Vision       Perception     Praxis      Cognition Arousal/Alertness: Awake/alert Behavior During Therapy: WFL for tasks assessed/performed Overall Cognitive Status: Within Functional Limits for tasks assessed                                 General Comments: pt much more alert        Exercises     Shoulder Instructions       General Comments      Pertinent Vitals/ Pain  Pain Assessment: Faces Faces Pain Scale: Hurts whole lot Pain Location: low back Pain Descriptors / Indicators: Grimacing;Guarding;Sore Pain Intervention(s): Monitored during session;Premedicated before session;Repositioned  Home Living                                          Prior Functioning/Environment              Frequency  Min 2X/week        Progress Toward Goals  OT Goals(current goals can now be found in the care plan section)  Progress  towards OT goals: Progressing toward goals  Acute Rehab OT Goals Patient Stated Goal: to reduce the pain OT Goal Formulation: With patient Time For Goal Achievement: 04/07/19 Potential to Achieve Goals: Good  Plan Discharge plan remains appropriate    Co-evaluation                 AM-PAC OT "6 Clicks" Daily Activity     Outcome Measure   Help from another person eating meals?: None Help from another person taking care of personal grooming?: A Little Help from another person toileting, which includes using toliet, bedpan, or urinal?: A Little Help from another person bathing (including washing, rinsing, drying)?: A Lot Help from another person to put on and taking off regular upper body clothing?: A Little Help from another person to put on and taking off regular lower body clothing?: A Lot 6 Click Score: 17    End of Session Equipment Utilized During Treatment: Rolling walker;Back brace  OT Visit Diagnosis: Unsteadiness on feet (R26.81);Muscle weakness (generalized) (M62.81);Pain   Activity Tolerance Patient limited by pain   Patient Left in bed;with call bell/phone within reach   Nurse Communication          Time: 3557-3220 OT Time Calculation (min): 26 min  Charges: OT General Charges $OT Visit: 1 Visit OT Treatments $Self Care/Home Management : 23-37 mins  Nestor Lewandowsky, OTR/L Acute Rehabilitation Services Pager: [phone] Office: [phone]   Malka So 03/25/2019, 9:08 AM

## 2019-03-25 NOTE — TOC Initial Note (Signed)
Transition of Care [facility]) - Initial/Assessment Note    Patient Details  Name: Amy Barnes MRN: [id number] Date of Birth: 1951/05/26  [facility] of [facility]) CM/SW Contact:    Oletta Cohn, RN Phone Number: 03/25/2019, 1:44 PM  Clinical Narrative:                 [facility] consulted regarding [facility] services for IV antibiotics and RN.  Expected Discharge Plan: Home w Home Health Services Barriers to Discharge: Continued Medical Work up(awaiting PICC line placement)   Patient Goals and CMS Choice   CMS Medicare.gov Compare Post Acute Care list provided to:: Patient Choice offered to / list presented to : Patient  Expected Discharge Plan and Services Expected Discharge Plan: Home w Home Health Services     Post Acute Care Choice: Home Health Living arrangements for the past 2 months: Single Family Home                           HH Arranged: RN HH Agency: [facility] Date [facility] Agency Contacted: 03/25/19 Time HH Agency Contacted: 1140 Representative spoke with at [facility] Agency: [place]  Prior Living Arrangements/Services Living arrangements for the past 2 months: Single Family Home Lives with:: Spouse   Do you feel safe going back to the place where you live?: Yes               Activities of Daily Living      Permission Sought/Granted Permission sought to share information with : Case Manager Permission granted to share information with : Yes, Verbal Permission Granted              Emotional Assessment Appearance:: Appears stated age Attitude/Demeanor/Rapport: Engaged(in pain) Affect (typically observed): Accepting, Tearful/Crying Orientation: : Oriented to Self, Oriented to Place, Oriented to  Time, Oriented to Situation Alcohol / Substance Use: Not Applicable Psych Involvement: No (comment)  Admission diagnosis:  SPONDYLOLISTHESIS, LUMBAR REGION Patient Active Problem List   Diagnosis Date Noted  . Lumbar adjacent segment disease with  spondylolisthesis 03/23/2019  . Bilateral lower extremity edema 11/29/2018  . Neurogenic urinary incontinence 11/29/2018  . Urinary incontinence 11/29/2018  . Bladder spasms 11/29/2018  . Discogenic low back pain 11/29/2018  . Chronic sacroiliac joint pain (Bilateral) (R>L) 11/29/2018  . Spondylosis without myelopathy or radiculopathy, lumbar region 11/09/2018  . Chronic low back pain (Bilateral) (L>R) w/o sciatica 11/09/2018  . History of hip replacement (Left) 10/13/2018  . History of back surgery 10/13/2018  . Vitamin D insufficiency 10/13/2018  . Osteoarthritis of facet joint of lumbar spine 10/13/2018  . Osteoarthritis involving multiple joints 10/13/2018  . Cervical radiculopathy (Left) 10/13/2018  . Lumbar facet arthropathy (Bilateral) 10/13/2018  . Lumbar facet syndrome (Bilateral) (L>R) 10/13/2018  . Abnormal MRI, lumbar spine (09/16/2018) 10/13/2018  . Lumbar nerve root compression (L4) (Left) 10/13/2018  . Lumbar foraminal stenosis 10/13/2018  . 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 Chronic low back pain (Primary Area of Pain) (Bilateral) (L>R) w/ sciatica (Left) 09/14/2018  . Chronic lower extremity pain (Secondary Area of Pain) (Left) 09/14/2018  . Chronic hip pain [facility] Area of Pain) (Bilateral) (L>R) 09/14/2018  . Chronic knee pain (Fourth Area of Pain) (Bilateral) (R>L) 09/14/2018  . Pharmacologic therapy 09/14/2018  . Disorder of skeletal system 09/14/2018  . Problems influencing health status 09/14/2018  . Vaginal itching 07/12/2018  . Primary osteoarthritis of  first carpometacarpal joint of left hand 04/20/2018  . 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 01/16/2018  . PAH (pulmonary artery hypertension) ([place]) 08/03/2017  . Advanced care planning/counseling discussion 06/25/2017  . Synovial cyst of lumbar facet joint 11/13/2016  . Thoracic aortic atherosclerosis ([place])   . CAD (coronary  artery disease) 07/30/2016  . Centrilobular emphysema ([place]) 07/30/2016  . Ex-smoker 07/08/2016  . Nonspecific abnormal electrocardiogram (ECG) (EKG) 07/08/2016  . Chronic lumbar radicular pain (L4) (Bilateral) 06/09/2016  . DOE (dyspnea on exertion) 05/22/2016  . Degenerative joint disease (DJD) of hip 04/17/2016  . Somatic dysfunction of sacroiliac joint 04/17/2016  . S/P Lumbar Fusion (L3-4 PLIF) 04/17/2016  . Cervical fusion syndrome 04/17/2016  . Pedal edema 02/19/2016  . Chronic pain syndrome 02/19/2016  . Abdominal aortic atherosclerosis ([place]) 12/11/2015  . MRSA (methicillin resistant Staphylococcus aureus) infection 10/11/2012  . Urinary urgency 07/28/2012  . Infection or inflammatory reaction due to internal joint prosthesis ([place]) 12/03/2011  . Morbid obesity with BMI of 50.0-59.9, adult ([place]) 05/13/2011  . Essential hypertension 05/17/2010  . HIP PAIN 12/25/2009  . Cervical radiculopathy (Right) 12/25/2009  . Hip pain 12/25/2009  . Benign positional vertigo 07/21/2008  . Hypercholesterolemia 08/16/2007  . Hypothyroidism 07/30/2007  . DM type 2 with diabetic peripheral neuropathy ([place]) 07/30/2007  . ANEMIA-NOS 07/30/2007  . Bipolar disorder ([place]) 07/30/2007  . Allergic rhinitis 07/30/2007  . Asthma 07/30/2007  . GERD 07/30/2007  . Osteoarthritis 07/30/2007  . OSA on CPAP 07/30/2007   PCP:  Ria Bush, MD Pharmacy:   [id number] [place], [place] [address] Phone: [phone] Fax: [phone]     Social Determinants of Health (SDOH) Interventions    Readmission Risk Interventions No flowsheet data found.

## 2019-03-25 NOTE — Consult Note (Addendum)
[facility]  Total days of antibiotics 2 doxy               Reason for Consult:HW linfection from previous lumbar surgery    Referring Physician: jenkins   Active Problems:   Lumbar adjacent segment disease with spondylolisthesis    HPI: Amy Barnes is a 68 y.o. female with hx of L3-L4 decompression fusion who had failed medical management for recurrent back/leg pain. She had ongoing lumbar pseudoarthrosis and L4-L5 spondylolisthesis on imaging to explain lumbar radiculopathy and neurogenic claudication. She underwent decompression PLIF and exploration of lumbar fusion/removal of old lumbar hardware. Possbily loosening of hw noted. Cultures sent from OR which showed MRSA.      Past Medical History:  Diagnosis Date  . Allergic rhinitis   . Ambulates with cane    straight  . Anemia   . Anxiety   . Asthma    seasonal  . Bipolar affective disorder (HCC)    takes Synthroid meds for Bipolar  . CAD (coronary artery disease) 07/2016   by CT scan  . Carpal tunnel syndrome    had surgery but occasional still has some issues per patient  . Cataract   . Centrilobular emphysema (HCC) 07/2016   by CT scan - pt not aware of this  . Constipation due to pain medication   . Depression with anxiety   . Diabetes mellitus    type 2 - no meds diet controlled  . GERD (gastroesophageal reflux disease)   . History of blood transfusion   . 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 Hyperlipidemia   . Hypertension   . OSA (obstructive sleep apnea)    no longer using cpap, uses a bed that raises and lowers hob  . Osteoarthritis   . Osteoarthritis   . Pneumonia   . Restless legs   . Septic arthritis (HCC) 10/11/2012  . Shingles 06/30/2016  . Shortness of breath    with exertion  . Status post revision of total hip replacement bilateral   prosthetic infection R 2013, L 2015  . SVD (spontaneous vaginal delivery)    x 3  .  Thoracic aortic atherosclerosis (HCC) 11/207   by CT    Allergies:  Allergies  Allergen Reactions  . Cephalexin Hives  . Hydrocodone Other (See Comments)    Reaction:  Hallucinations   . Risperidone And Related Other (See Comments)    Reaction:  Made pt excessively sleepy  . Seroquel [Quetiapine Fumarate] Other (See Comments)    Reaction:  Made pt excessively sleepy  . Sulfa Antibiotics Rash    MEDICATIONS: . docusate sodium  100 mg Oral BID  . doxycycline  100 mg Oral QHS  . furosemide  40 mg Oral Daily  . insulin aspart  0-20 Units Subcutaneous TID WC  . lamoTRIgine  200 mg Oral Daily  . levothyroxine  150 mcg Oral QHS  . losartan  100 mg Oral Daily  . mometasone-formoterol  2 puff Inhalation BID  . montelukast  10 mg Oral QHS  . multivitamin with minerals  2 tablet Oral Daily  . nystatin   Topical QID  . OXcarbazepine  300 mg Oral Daily   And  . OXcarbazepine  600 mg Oral QHS  . oxybutynin  10 mg Oral QHS  . pantoprazole  40 mg Oral Daily  . simvastatin  40 mg Oral QPM  . sodium chloride  flush  3 mL Intravenous Q12H    Social History   Tobacco Use  . Smoking status: Former Smoker    Packs/day: 1.50    Years: 40.00    Pack years: 60.00    Types: Cigarettes    Quit date: 04/29/2009    Years since quitting: 9.9  . Smokeless tobacco: Never Used  Substance Use Topics  . Alcohol use: Yes    Alcohol/week: 21.0 standard drinks    Types: 21 Shots of liquor per week    Comment: 3 shots/per night  . Drug use: No    Family History  Problem Relation Age of Onset  . Aneurysm Father 62       brain  . Alcohol abuse Father   . Cancer Father        possibly  . CAD Other        several siblings  . Cancer Brother        prostate  . Diabetes Brother   . Diabetes Sister   . Anesthesia problems Neg Hx   . Hypotension Neg Hx   . Malignant hyperthermia Neg Hx   . Pseudochol deficiency Neg Hx     Review of Systems  Constitutional: Negative for fever, chills,  diaphoresis, activity change, appetite change, fatigue and unexpected weight change.  HENT: Negative for congestion, sore throat, rhinorrhea, sneezing, trouble swallowing and sinus pressure.  Eyes: Negative for photophobia and visual disturbance.  Respiratory: Negative for cough, chest tightness, shortness of breath, wheezing and stridor.  Cardiovascular: Negative for chest pain, palpitations and leg swelling.  Gastrointestinal: Negative for nausea, vomiting, abdominal pain, diarrhea, constipation, blood in stool, abdominal distention and anal bleeding.  Genitourinary: Negative for dysuria, hematuria, flank pain and difficulty urinating.  Musculoskeletal: + back pain Skin: Negative for color change, pallor, rash and wound.  Neurological: Negative for dizziness, tremors, weakness and light-headedness.  Hematological: Negative for adenopathy. Does not bruise/bleed easily.  Psychiatric/Behavioral: Negative for behavioral problems, confusion, sleep disturbance, dysphoric mood, decreased concentration and agitation.     OBJECTIVE: Temp:  [97.5 F (36.4 C)-98.7 F (37.1 C)] 97.7 F (36.5 C) (06/26 1242) Pulse Rate:  [75-99] 99 (06/26 1242) Resp:  [18-20] 18 (06/26 1242) BP: (114-165)/(55-96) 160/96 (06/26 1242) SpO2:  [85 %-100 %] 100 % (06/26 1242) Physical Exam  Constitutional:  oriented to person, place, and time. appears well-developed and well-nourished. In mild distress in finding out she has infection HENT: [place]/AT, PERRLA, no scleral icterus Mouth/Throat: Oropharynx is clear and moist. No oropharyngeal exudate.  Cardiovascular: Normal rate, regular rhythm and normal heart sounds. Exam reveals no gallop and no friction rub.  No murmur heard.  Pulmonary/Chest: Effort normal and breath sounds normal. No respiratory distress.  has no wheezes.  Back = wearing back pain Abdominal: Soft. Bowel sounds are normal.  exhibits no distension. There is no tenderness.  Lymphadenopathy: no cervical  adenopathy. No axillary adenopathy Neurological: alert and oriented to person, place, and time.  Skin: Skin is warm and dry. No rash noted. No erythema.  Psychiatric: a normal mood and affect.  behavior is normal.    LABS: Results for orders placed or performed during the hospital encounter of 03/23/19 (from the past 48 hour(s))  Type and screen     Status: None   Collection Time: 03/23/19  2:03 PM  Result Value Ref Range   ABO/RH(D) A POS    Antibody Screen POS    Sample Expiration      03/26/2019,2359 Performed at [facility], [address] [place], [place] [address]   Glucose, capillary     Status: Abnormal   Collection Time: 03/23/19  4:24 PM  Result Value Ref Range   Glucose-Capillary 156 (H) 70 - 99 mg/dL   Comment 1 Notify RN   Glucose, capillary     Status: Abnormal   Collection Time: 03/23/19  9:44 PM  Result Value Ref Range   Glucose-Capillary 195 (H) 70 - 99 mg/dL   Comment 1 Notify RN    Comment 2 Document in Chart   CBC     Status: Abnormal   Collection Time: 03/24/19  6:13 AM  Result Value Ref Range   WBC 9.9 4.0 - 10.5 K/uL   RBC 3.65 (L) 3.87 - 5.11 MIL/uL   Hemoglobin 10.6 (L) 12.0 - 15.0 g/dL   HCT 60.432.7 (L) 54.036.0 - 98.146.0 %   MCV 89.6 80.0 - 100.0 fL   MCH 29.0 26.0 - 34.0 pg   MCHC 32.4 30.0 - 36.0 g/dL   RDW 19.114.6 47.811.5 - 29.515.5 %   Platelets 204 150 - 400 K/uL   nRBC 0.0 0.0 - 0.2 %    Comment: Performed at [facility], [address] [place], [place] [address]  Basic Metabolic Panel     Status: Abnormal   Collection Time: 03/24/19  6:13 AM  Result Value Ref Range   Sodium 131 (L) 135 - 145 mmol/L   Potassium 4.6 3.5 - 5.1 mmol/L   Chloride 97 (L) 98 - 111 mmol/L   CO2 27 22 - 32 mmol/L   Glucose, Bld 172 (H) 70 - 99 mg/dL   BUN 14 8 - 23 mg/dL   Creatinine, Ser 8.650.73 0.44 - 1.00 mg/dL   Calcium 8.7 (L) 8.9 - 10.3 mg/dL   GFR calc non Af Amer >60 >60 mL/min   GFR calc Af Amer >60 >60 mL/min   Anion gap 7 5 - 15    Comment:  Performed at [facility], [address] [place], [place] [address]  Hemoglobin A1c     Status: Abnormal   Collection Time: 03/24/19  6:13 AM  Result Value Ref Range   Hgb A1c MFr Bld 6.5 (H) 4.8 - 5.6 %    Comment: (NOTE) Pre diabetes:          5.7%-6.4% Diabetes:              >6.4% Glycemic control for   <7.0% adults with diabetes    Mean Plasma Glucose 139.85 mg/dL    Comment: Performed at [facility], [address] [place], [place] [address]  Glucose, capillary     Status: Abnormal   Collection Time: 03/24/19  6:32 AM  Result Value Ref Range   Glucose-Capillary 165 (H) 70 - 99 mg/dL   Comment 1 Notify RN    Comment 2 Document in Chart   Glucose, capillary     Status: Abnormal   Collection Time: 03/24/19 12:22 PM  Result Value Ref Range   Glucose-Capillary 144 (H) 70 - 99 mg/dL  Glucose, capillary     Status: Abnormal   Collection Time: 03/24/19  2:58 PM  Result Value Ref Range   Glucose-Capillary 149 (H) 70 - 99 mg/dL  Glucose, capillary     Status: Abnormal   Collection Time: 03/24/19  6:46 PM  Result Value Ref Range   Glucose-Capillary 175 (H) 70 - 99 mg/dL  Glucose, capillary     Status: Abnormal  Collection Time: 03/24/19  9:12 PM  Result Value Ref Range   Glucose-Capillary 169 (H) 70 - 99 mg/dL   Comment 1 Notify RN    Comment 2 Document in Chart   Basic metabolic panel     Status: Abnormal   Collection Time: 03/25/19  6:10 AM  Result Value Ref Range   Sodium 127 (L) 135 - 145 mmol/L   Potassium 4.2 3.5 - 5.1 mmol/L   Chloride 93 (L) 98 - 111 mmol/L   CO2 24 22 - 32 mmol/L   Glucose, Bld 177 (H) 70 - 99 mg/dL   BUN 9 8 - 23 mg/dL   Creatinine, Ser 0.71 0.44 - 1.00 mg/dL   Calcium 9.0 8.9 - 10.3 mg/dL   GFR calc non Af Amer >60 >60 mL/min   GFR calc Af Amer >60 >60 mL/min   Anion gap 10 5 - 15    Comment: Performed at [facility], [place] [address] [place], [place] [address]  Glucose, capillary     Status: Abnormal   Collection Time:  03/25/19  6:21 AM  Result Value Ref Range   Glucose-Capillary 175 (H) 70 - 99 mg/dL   Comment 1 Notify RN    Comment 2 Document in Chart   Glucose, capillary     Status: Abnormal   Collection Time: 03/25/19 12:39 PM  Result Value Ref Range   Glucose-Capillary 122 (H) 70 - 99 mg/dL    MICRO:  Methicillin resistant staphylococcus aureus    MIC    CIPROFLOXACIN >=8 RESISTANT  Resistant    CLINDAMYCIN >=8 RESISTANT  Resistant    ERYTHROMYCIN >=8 RESISTANT  Resistant    GENTAMICIN <=0.5 SENSI... Sensitive    Inducible Clindamycin NEGATIVE  Sensitive    OXACILLIN >=4 RESISTANT  Resistant    RIFAMPIN <=0.5 SENSI... Sensitive    TETRACYCLINE 2 SENSITIVE  Sensitive    TRIMETH/SULFA <=10 SENSIT... Sensitive    VANCOMYCIN <=0.5 SENSI... Sensitive     IMAGING: Dg Lumbar Spine 2-3 Views  Result Date: 03/23/2019 CLINICAL DATA:  Posterior lumbar interbody fusion L4-L5 EXAM: DG C-ARM 61-120 MIN; LUMBAR SPINE - 2-3 VIEW COMPARISON:  MRI 09/15/2018, radiograph 08/31/2018 FINDINGS: Two low resolution intraoperative spot views of the lumbar spine. Total fluoroscopy time was 27 seconds. Submitted images demonstrate removal of previously noted posterior spinal rods at L3-L4 and left fixating screw at L3. Placement of fixating screws at what appears to be L5 with interbody device at presumed L4-L5 level. IMPRESSION: Intraoperative fluoroscopic assistance provided during lumbar spine surgery Electronically Signed   By: Donavan Foil M.D.   On: 03/23/2019 17:25   Dg C-arm 1-60 Min  Result Date: 03/23/2019 CLINICAL DATA:  Posterior lumbar interbody fusion L4-L5 EXAM: DG C-ARM 61-120 MIN; LUMBAR SPINE - 2-3 VIEW COMPARISON:  MRI 09/15/2018, radiograph 08/31/2018 FINDINGS: Two low resolution intraoperative spot views of the lumbar spine. Total fluoroscopy time was 27 seconds. Submitted images demonstrate removal of previously noted posterior spinal rods at L3-L4 and left fixating screw at L3. Placement of  fixating screws at what appears to be L5 with interbody device at presumed L4-L5 level. IMPRESSION: Intraoperative fluoroscopic assistance provided during lumbar spine surgery Electronically Signed   By: Donavan Foil M.D.   On: 03/23/2019 17:25   [place] Ekg Site Rite  Result Date: 03/25/2019 If Site Rite image not attached, placement could not be confirmed due to current cardiac rhythm.   HISTORICAL MICRO/IMAGING  Assessment/Plan:  MRSA lumbar infection with HW involvement.  -  plan to treat with 8 wk of IV vancomycin, dosing per protocol per home health. Goal trough of 15-20 - due to infection close to [facility], will add rifampin 300mg  BID to minimize risk of biofilm developmenet - will need twice a week lab for cr function, and weekly cbc, sed rate, crp, and vanco trough - will get picc line arranged today - will ask patient to hold zocor temporarily for next 6 wk while on rifampin, to minimize drug interaction - will also will have the patient be seen back in 4 wk

## 2019-03-25 NOTE — Plan of Care (Signed)
Patient alert and oriented, Amy Barnes's well, voiding adequate amount of urine, swallowing without difficulty, c/o moderate pain and meds given prior to discharged for ride and discomfort. Patient discharged home with family. Script and discharged instructions given to patient. Patient and family stated understanding of instructions given. Supplies and dressing instructions given to patient's daughter, with stated understanding of instructions given. Patient has an appointment with Dr. [name] and Infection disease.

## 2019-03-25 NOTE — Discharge Summary (Signed)
Physician Discharge Summary    Providing Compassionate, Quality Care - Together   Patient ID: Amy Barnes MRN: [id number] DOB/AGE: 06-May-1951 68 y.o.  Admit date: 03/23/2019 Discharge date: 03/25/2019  Admission Diagnoses: Lumbar pseudoarthrosis, lumbar spondylolisthesis, degenerative disc disease, spinal stenosis compressing both the L4 and the L5 nerve roots; lumbago; lumbar radiculopathy; neurogenic claudication  Discharge Diagnoses:  Active Problems: Lumbar adjacent segment disease with spondylolisthesis, wound infection   Discharged Condition: good  Hospital Course: Ms. Pe underwent an L4-5 transforminal lumbar interbody fusion and exploration of her previous fusion on 03/23/2019 by Dr. [name]. The hardware at the patient's previous fusion at L4-5 was loose, indicative of pseudoarthrosis. There was some purulent drainage around the loosened hardware that was cultured and grew MRSA. Infectious disease was consulted. A PICC line was placed on 03/25/2019 and home health was arranged. Patient is ready for discharge home.  Consults: Rehabilitation Medicine, Infectious Disease  Significant Diagnostic Studies: Dg Lumbar Spine 2-3 Views  Result Date: 03/23/2019 CLINICAL DATA:  Posterior lumbar interbody fusion L4-L5 EXAM: DG C-ARM 61-120 MIN; LUMBAR SPINE - 2-3 VIEW COMPARISON:  MRI 09/15/2018, radiograph 08/31/2018 FINDINGS: Two low resolution intraoperative spot views of the lumbar spine. Total fluoroscopy time was 27 seconds. Submitted images demonstrate removal of previously noted posterior spinal rods at L3-L4 and left fixating screw at L3. Placement of fixating screws at what appears to be L5 with interbody device at presumed L4-L5 level. IMPRESSION: Intraoperative fluoroscopic assistance provided during lumbar spine surgery Electronically Signed   By: Donavan Foil M.D.   On: 03/23/2019 17:25   Dg C-arm 1-60 Min  Result Date: 03/23/2019 CLINICAL DATA:  Posterior lumbar interbody  fusion L4-L5 EXAM: DG C-ARM 61-120 MIN; LUMBAR SPINE - 2-3 VIEW COMPARISON:  MRI 09/15/2018, radiograph 08/31/2018 FINDINGS: Two low resolution intraoperative spot views of the lumbar spine. Total fluoroscopy time was 27 seconds. Submitted images demonstrate removal of previously noted posterior spinal rods at L3-L4 and left fixating screw at L3. Placement of fixating screws at what appears to be L5 with interbody device at presumed L4-L5 level. IMPRESSION: Intraoperative fluoroscopic assistance provided during lumbar spine surgery Electronically Signed   By: Donavan Foil M.D.   On: 03/23/2019 17:25   [place] Ekg Site Rite  Result Date: 03/25/2019 If Site Rite image not attached, placement could not be confirmed due to current cardiac rhythm.   Treatments: Surgery: Bilateral L4-5 laminotomy/foraminotomies/medial facetectomy to decompress the bilateral L4 and L5 nerve roots(the work required to do this was in addition to the work required to do the posterior lumbar interbody fusion because of the patient's spinal stenosis, facet arthropathy. Etc. requiring a wide decompression of the nerve roots.);  L4-5 transforaminal lumbar interbody fusion with local morselized autograft bone and Zimmer DBM; insertion of interbody prosthesis at L4-5 (globus peek expandable interbody prosthesis); posterior segmental instrumentation from L3 to to L5 with globus titanium pedicle screws and rods; posterior lateral arthrodesis at L3-4 and L4-5 with local morselized autograft bone and Zimmer DBM ; exploration of lumbar fusion/removal of old lumbar hardware  Discharge Exam: Blood pressure 127/73, pulse 86, temperature 98.2 F (36.8 C), temperature source Oral, resp. rate 18, height '5\' 4"'$  (1.626 m), weight (!) 168.4 kg, SpO2 97 %.   Alert and oriented x 4 MAE CN II-XII grossly intact Incision is clean, dry, and intact with some peri-incisional ecchymosis  Disposition: Discharge disposition: 01-Home or Self Care        Discharge Instructions    Face-to-face encounter (required for Medicare/Medicaid  patients)   Complete by: As directed    I Patricia Nettle certify that this patient is under my care and that I, or a nurse practitioner or physician's assistant working with me, had a face-to-face encounter that meets the physician face-to-face encounter requirements with this patient on 03/25/2019. The encounter with the patient was in whole, or in part for the following medical condition(s) which is the primary reason for home health care (List medical condition): Lumbar pseudoarthrosis; Status post fusion revision   The encounter with the patient was in whole, or in part, for the following medical condition, which is the primary reason for home health care: Lumbar pseudoarthrosis; Status post fusion revision   I certify that, based on my findings, the following services are medically necessary home health services:  Nursing Physical therapy     Reason for Medically Necessary Home Health Services: Therapy- Therapeutic Exercises to Increase Strength and Endurance   My clinical findings support the need for the above services: Unable to leave home safely without assistance and/or assistive device   Further, I certify that my clinical findings support that this patient is homebound due to: Unable to leave home safely without assistance   Home Health   Complete by: As directed    To provide the following care/treatments:  PT OT RN     Home infusion instructions [facility] May follow [place] Dosing Protocol; May administer Cathflo as needed to maintain patency of vascular access device.; Flushing of vascular access device: per [facility] Protocol: 0.9% NaCl pre/post medica...   Complete by: As directed    Instructions: May follow [place] Dosing Protocol   Instructions: May administer Cathflo as needed to maintain patency of vascular access device.   Instructions: Flushing of vascular access device: per [facility]  Protocol: 0.9% NaCl pre/post medication administration and prn patency; Heparin 100 u/ml, 40m for implanted ports and Heparin 10u/ml, 574mfor all other central venous catheters.   Instructions: May follow [facility] Anaphylaxis Protocol for First Dose Administration in the home: 0.9% NaCl at 25-50 ml/hr to maintain IV access for protocol meds. Epinephrine 0.3 ml IV/IM PRN and Benadryl 25-50 IV/IM PRN s/s of anaphylaxis.   Instructions: [facility] Coordinator (RN) to assist per patient IV care needs in the home PRN.     Allergies as of 03/25/2019      Reactions   Cephalexin Hives   Hydrocodone Other (See Comments)   Reaction:  Hallucinations    Risperidone And Related Other (See Comments)   Reaction:  Made pt excessively sleepy   Seroquel [quetiapine Fumarate] Other (See Comments)   Reaction:  Made pt excessively sleepy   Sulfa Antibiotics Rash      Medication List    STOP taking these medications   cyclobenzaprine 10 MG tablet Commonly known as: FLEXERIL   oxyCODONE-acetaminophen 5-325 MG tablet Commonly known as: Percocet   simvastatin 40 MG tablet Commonly known as: ZOCOR   traMADol 50 MG tablet Commonly known as: ULTRAM     TAKE these medications   albuterol 108 (90 Base) MCG/ACT inhaler Commonly known as: VENTOLIN HFA Inhale 2 puffs into the lungs every 6 (six) hours as needed for wheezing or shortness of breath.   aspirin 81 MG EC tablet Take 81 mg by mouth at bedtime.   celecoxib 200 MG capsule Commonly known as: CELEBREX Take 1 capsule (200 mg total) by mouth daily.   docusate sodium 100 MG capsule Commonly known as: COLACE Take 1  capsule (100 mg total) by mouth 2 (two) times daily.   doxycycline 100 MG tablet Commonly known as: VIBRA-TABS TAKE 1 TABLET (100 MG TOTAL) BY MOUTH AT BEDTIME. What changed: See the new instructions.   fluconazole 150 MG tablet Commonly known as: DIFLUCAN TAKE 1 TABLET BY MOUTH ONCE FOR YEAST INFECTION REPEAT IN 72  HOURS.AS NEEDED What changed: See the new instructions.   Fluticasone-Salmeterol 250-50 MCG/DOSE Aepb Commonly known as: ADVAIR Inhale 2 puffs into the lungs at bedtime.   furosemide 40 MG tablet Commonly known as: LASIX Take 40 mg by mouth daily.   lamoTRIgine 200 MG tablet Commonly known as: LAMICTAL Take 200 mg by mouth daily.   levothyroxine 150 MCG tablet Commonly known as: SYNTHROID Take 1 tablet (150 mcg total) by mouth at bedtime.   losartan 100 MG tablet Commonly known as: COZAAR Take 1 tablet (100 mg total) by mouth daily.   methocarbamol 750 MG tablet Commonly known as: Robaxin-750 Take 1 tablet (750 mg total) by mouth every 6 (six) hours as needed for muscle spasms.   montelukast 10 MG tablet Commonly known as: SINGULAIR TAKE 1 TABLET BY MOUTH EVERYDAY AT BEDTIME What changed: See the new instructions.   multivitamin with minerals Tabs tablet Take 2 tablets by mouth daily.   nystatin powder Commonly known as: MYCOSTATIN/NYSTOP Apply topically 4 (four) times daily.   ondansetron 8 MG tablet Commonly known as: ZOFRAN Take 8 mg by mouth every 8 (eight) hours as needed for nausea or vomiting.   Oxcarbazepine 300 MG tablet Commonly known as: TRILEPTAL Take 300-600 mg by mouth See admin instructions. Take 300 mg by mouth in the morning and 600 mg at night   oxybutynin 10 MG 24 hr tablet Commonly known as: Ditropan XL Take 1 tablet (10 mg total) by mouth at bedtime.   Oxycodone HCl 10 MG Tabs Take 1 tablet (10 mg total) by mouth every 4 (four) hours as needed for severe pain ((score 7 to 10)).   pantoprazole 40 MG tablet Commonly known as: PROTONIX Take 1 tablet (40 mg total) by mouth daily.   promethazine 25 MG tablet Commonly known as: PHENERGAN Take 25 mg by mouth every 6 (six) hours as needed for nausea or vomiting.   rifampin 300 MG capsule Commonly known as: RIFADIN Take 1 capsule (300 mg total) by mouth every 12 (twelve) hours.    vancomycin  IVPB Inject 2,250 mg into the vein daily. Indication:  MRSA Wound Infection  Last Day of Therapy:  05/19/2019 Labs - Sunday/Monday:  CBC/D, BMP, and vancomycin trough. Labs - Thursday:  BMP and vancomycin trough Labs - Every other week:  ESR and CRP            Home Infusion Instuctions  (From admission, onward)         Start     Ordered   03/25/19 0000  Home infusion instructions [facility] May follow [place] Dosing Protocol; May administer Cathflo as needed to maintain patency of vascular access device.; Flushing of vascular access device: per [facility] Protocol: 0.9% NaCl pre/post medica...    Question Answer Comment  Instructions May follow [place] Dosing Protocol   Instructions May administer Cathflo as needed to maintain patency of vascular access device.   Instructions Flushing of vascular access device: per [facility] Protocol: 0.9% NaCl pre/post medication administration and prn patency; Heparin 100 u/ml, 110m for implanted ports and Heparin 10u/ml, 589mfor all other central venous catheters.   Instructions May  follow [facility] Anaphylaxis Protocol for First Dose Administration in the home: 0.9% NaCl at 25-50 ml/hr to maintain IV access for protocol meds. Epinephrine 0.3 ml IV/IM PRN and Benadryl 25-50 IV/IM PRN s/s of anaphylaxis.   Instructions [facility] Infusion Coordinator (RN) to assist per patient IV care needs in the home PRN.      03/25/19 1347         Follow-up Information    Carlyle Basques, MD Follow up.   Specialty: Infectious Diseases Why: 7/22 at 2:30 pm. Please call to reschedule if you are unable to make this appointment.  Contact information: [address] [place] [address] [phone]        Newman Pies, MD. Schedule an appointment as soon as possible for a visit in 2 week(s).   Specialty: Neurosurgery Contact information: [address] [place] [address] [place] [address] [phone]            Signed: Patricia Nettle 03/25/2019, 5:44 PM

## 2019-03-25 NOTE — Progress Notes (Signed)
Peripherally Inserted Central Catheter/Midline Placement  The IV Nurse has discussed with the patient and/or persons authorized to consent for the patient, the purpose of this procedure and the potential benefits and risks involved with this procedure.  The benefits include less needle sticks, lab draws from the catheter, and the patient may be discharged home with the catheter. Risks include, but not limited to, infection, bleeding, blood clot (thrombus formation), and puncture of an artery; nerve damage and irregular heartbeat and possibility to perform a PICC exchange if needed/ordered by physician.  Alternatives to this procedure were also discussed.  Bard Power PICC patient education guide, fact sheet on infection prevention and patient information card has been provided to patient /or left at bedside.    PICC/Midline Placement Documentation        Amy Barnes 03/25/2019, 12:34 PM

## 2019-03-25 NOTE — TOC Progression Note (Signed)
Transition of Care [facility]) - Progression Note    Patient Details  Name: CANDELARIA PIES MRN: [id number] Date of Birth: [birth year]  Transition of Care [facility]) CM/SW Contact  Fuller Mandril, RN Phone Number: 03/25/2019, 1:45 PM  Clinical Narrative:    Cote Mayabb J. Clydene Laming, RN, BSN, [employer] [phone]  Spoke with pt at bedside regarding discharge planning for [facility]. Offered pt list of home health agencies to choose from.  Pt chose [facility] and [facility]  to render services. Pam Tamera Punt and [place] of [facility] and [facility] notified.     Expected Discharge Plan: [place] Barriers to Discharge: Continued Medical Work up(awaiting PICC line placement)  Expected Discharge Plan and Services Expected Discharge Plan: [place] Choice: [place] arrangements for the past 2 months: Single Family Home                           HH Arranged: RN [facility] Agency: [place] Date [place]: 03/25/19 Time [place]: 1140 Representative spoke with at [place]: [place]) Interventions    Readmission Risk Interventions No flowsheet data found.

## 2019-03-26 DIAGNOSIS — M5116 Intervertebral disc disorders with radiculopathy, lumbar region: Secondary | ICD-10-CM | POA: Diagnosis not present

## 2019-03-26 DIAGNOSIS — B9562 Methicillin resistant Staphylococcus aureus infection as the cause of diseases classified elsewhere: Secondary | ICD-10-CM | POA: Diagnosis not present

## 2019-03-26 DIAGNOSIS — M170 Bilateral primary osteoarthritis of knee: Secondary | ICD-10-CM | POA: Diagnosis not present

## 2019-03-26 DIAGNOSIS — F419 Anxiety disorder, unspecified: Secondary | ICD-10-CM | POA: Diagnosis not present

## 2019-03-26 DIAGNOSIS — K219 Gastro-esophageal reflux disease without esophagitis: Secondary | ICD-10-CM | POA: Diagnosis not present

## 2019-03-26 DIAGNOSIS — I10 Essential (primary) hypertension: Secondary | ICD-10-CM | POA: Diagnosis not present

## 2019-03-26 DIAGNOSIS — F319 Bipolar disorder, unspecified: Secondary | ICD-10-CM | POA: Diagnosis not present

## 2019-03-26 DIAGNOSIS — M48062 Spinal stenosis, lumbar region with neurogenic claudication: Secondary | ICD-10-CM | POA: Diagnosis not present

## 2019-03-26 DIAGNOSIS — T8463XA Infection and inflammatory reaction due to internal fixation device of spine, initial encounter: Secondary | ICD-10-CM | POA: Diagnosis not present

## 2019-03-26 DIAGNOSIS — Z452 Encounter for adjustment and management of vascular access device: Secondary | ICD-10-CM | POA: Diagnosis not present

## 2019-03-26 DIAGNOSIS — Z7982 Long term (current) use of aspirin: Secondary | ICD-10-CM | POA: Diagnosis not present

## 2019-03-26 DIAGNOSIS — E119 Type 2 diabetes mellitus without complications: Secondary | ICD-10-CM | POA: Diagnosis not present

## 2019-03-26 DIAGNOSIS — I2510 Atherosclerotic heart disease of native coronary artery without angina pectoris: Secondary | ICD-10-CM | POA: Diagnosis not present

## 2019-03-26 DIAGNOSIS — I700 Atherosclerosis of aorta: Secondary | ICD-10-CM | POA: Diagnosis not present

## 2019-03-26 DIAGNOSIS — Z5181 Encounter for therapeutic drug level monitoring: Secondary | ICD-10-CM | POA: Diagnosis not present

## 2019-03-26 DIAGNOSIS — T84216A Breakdown (mechanical) of internal fixation device of vertebrae, initial encounter: Secondary | ICD-10-CM | POA: Diagnosis not present

## 2019-03-26 DIAGNOSIS — Z6841 Body Mass Index (BMI) 40.0 and over, adult: Secondary | ICD-10-CM | POA: Diagnosis not present

## 2019-03-26 DIAGNOSIS — G4733 Obstructive sleep apnea (adult) (pediatric): Secondary | ICD-10-CM | POA: Diagnosis not present

## 2019-03-26 DIAGNOSIS — E785 Hyperlipidemia, unspecified: Secondary | ICD-10-CM | POA: Diagnosis not present

## 2019-03-26 DIAGNOSIS — D649 Anemia, unspecified: Secondary | ICD-10-CM | POA: Diagnosis not present

## 2019-03-26 DIAGNOSIS — G2581 Restless legs syndrome: Secondary | ICD-10-CM | POA: Diagnosis not present

## 2019-03-26 DIAGNOSIS — J432 Centrilobular emphysema: Secondary | ICD-10-CM | POA: Diagnosis not present

## 2019-03-26 DIAGNOSIS — Z792 Long term (current) use of antibiotics: Secondary | ICD-10-CM | POA: Diagnosis not present

## 2019-03-26 DIAGNOSIS — M4316 Spondylolisthesis, lumbar region: Secondary | ICD-10-CM | POA: Diagnosis not present

## 2019-03-28 ENCOUNTER — Encounter (HOSPITAL_COMMUNITY): Payer: Self-pay | Admitting: Neurosurgery

## 2019-03-28 DIAGNOSIS — T84216A Breakdown (mechanical) of internal fixation device of vertebrae, initial encounter: Secondary | ICD-10-CM | POA: Diagnosis not present

## 2019-03-28 DIAGNOSIS — M5136 Other intervertebral disc degeneration, lumbar region: Secondary | ICD-10-CM | POA: Diagnosis not present

## 2019-03-28 DIAGNOSIS — T8463XA Infection and inflammatory reaction due to internal fixation device of spine, initial encounter: Secondary | ICD-10-CM | POA: Diagnosis not present

## 2019-03-28 DIAGNOSIS — M5116 Intervertebral disc disorders with radiculopathy, lumbar region: Secondary | ICD-10-CM | POA: Diagnosis not present

## 2019-03-28 DIAGNOSIS — B9562 Methicillin resistant Staphylococcus aureus infection as the cause of diseases classified elsewhere: Secondary | ICD-10-CM | POA: Diagnosis not present

## 2019-03-28 DIAGNOSIS — M4316 Spondylolisthesis, lumbar region: Secondary | ICD-10-CM | POA: Diagnosis not present

## 2019-03-28 DIAGNOSIS — Z452 Encounter for adjustment and management of vascular access device: Secondary | ICD-10-CM | POA: Diagnosis not present

## 2019-03-28 LAB — TYPE AND SCREEN
ABO/RH(D): A POS
Antibody Screen: POSITIVE
Donor AG Type: NEGATIVE
Donor AG Type: NEGATIVE
Unit division: 0
Unit division: 0

## 2019-03-28 LAB — AEROBIC/ANAEROBIC CULTURE W GRAM STAIN (SURGICAL/DEEP WOUND)

## 2019-03-28 LAB — BPAM RBC
Blood Product Expiration Date: 202007032359
Blood Product Expiration Date: 202007032359
ISSUE DATE / TIME: 202006281402
ISSUE DATE / TIME: 202006281402
Unit Type and Rh: 5100
Unit Type and Rh: 5100

## 2019-03-30 DIAGNOSIS — M5116 Intervertebral disc disorders with radiculopathy, lumbar region: Secondary | ICD-10-CM | POA: Diagnosis not present

## 2019-03-30 DIAGNOSIS — T8463XA Infection and inflammatory reaction due to internal fixation device of spine, initial encounter: Secondary | ICD-10-CM | POA: Diagnosis not present

## 2019-03-30 DIAGNOSIS — Z452 Encounter for adjustment and management of vascular access device: Secondary | ICD-10-CM | POA: Diagnosis not present

## 2019-03-30 DIAGNOSIS — M4316 Spondylolisthesis, lumbar region: Secondary | ICD-10-CM | POA: Diagnosis not present

## 2019-03-30 DIAGNOSIS — T84216A Breakdown (mechanical) of internal fixation device of vertebrae, initial encounter: Secondary | ICD-10-CM | POA: Diagnosis not present

## 2019-03-30 DIAGNOSIS — B9562 Methicillin resistant Staphylococcus aureus infection as the cause of diseases classified elsewhere: Secondary | ICD-10-CM | POA: Diagnosis not present

## 2019-03-31 DIAGNOSIS — M4316 Spondylolisthesis, lumbar region: Secondary | ICD-10-CM | POA: Diagnosis not present

## 2019-03-31 DIAGNOSIS — M5116 Intervertebral disc disorders with radiculopathy, lumbar region: Secondary | ICD-10-CM | POA: Diagnosis not present

## 2019-03-31 DIAGNOSIS — Z452 Encounter for adjustment and management of vascular access device: Secondary | ICD-10-CM | POA: Diagnosis not present

## 2019-03-31 DIAGNOSIS — T8463XA Infection and inflammatory reaction due to internal fixation device of spine, initial encounter: Secondary | ICD-10-CM | POA: Diagnosis not present

## 2019-03-31 DIAGNOSIS — T84216A Breakdown (mechanical) of internal fixation device of vertebrae, initial encounter: Secondary | ICD-10-CM | POA: Diagnosis not present

## 2019-03-31 DIAGNOSIS — B9562 Methicillin resistant Staphylococcus aureus infection as the cause of diseases classified elsewhere: Secondary | ICD-10-CM | POA: Diagnosis not present

## 2019-04-01 ENCOUNTER — Encounter: Payer: Self-pay | Admitting: Family Medicine

## 2019-04-01 DIAGNOSIS — T847XXA Infection and inflammatory reaction due to other internal orthopedic prosthetic devices, implants and grafts, initial encounter: Secondary | ICD-10-CM | POA: Insufficient documentation

## 2019-04-01 NOTE — Progress Notes (Signed)
I reviewed health advisor's note, was available for consultation, and agree with documentation and plan.  

## 2019-04-04 DIAGNOSIS — I2510 Atherosclerotic heart disease of native coronary artery without angina pectoris: Secondary | ICD-10-CM | POA: Diagnosis not present

## 2019-04-04 DIAGNOSIS — T84216A Breakdown (mechanical) of internal fixation device of vertebrae, initial encounter: Secondary | ICD-10-CM | POA: Diagnosis not present

## 2019-04-04 DIAGNOSIS — T8463XA Infection and inflammatory reaction due to internal fixation device of spine, initial encounter: Secondary | ICD-10-CM | POA: Diagnosis not present

## 2019-04-04 DIAGNOSIS — B9562 Methicillin resistant Staphylococcus aureus infection as the cause of diseases classified elsewhere: Secondary | ICD-10-CM | POA: Diagnosis not present

## 2019-04-04 DIAGNOSIS — Z452 Encounter for adjustment and management of vascular access device: Secondary | ICD-10-CM | POA: Diagnosis not present

## 2019-04-04 DIAGNOSIS — M4316 Spondylolisthesis, lumbar region: Secondary | ICD-10-CM | POA: Diagnosis not present

## 2019-04-04 DIAGNOSIS — M5116 Intervertebral disc disorders with radiculopathy, lumbar region: Secondary | ICD-10-CM | POA: Diagnosis not present

## 2019-04-05 DIAGNOSIS — Z452 Encounter for adjustment and management of vascular access device: Secondary | ICD-10-CM | POA: Diagnosis not present

## 2019-04-05 DIAGNOSIS — T8463XA Infection and inflammatory reaction due to internal fixation device of spine, initial encounter: Secondary | ICD-10-CM | POA: Diagnosis not present

## 2019-04-05 DIAGNOSIS — B9562 Methicillin resistant Staphylococcus aureus infection as the cause of diseases classified elsewhere: Secondary | ICD-10-CM | POA: Diagnosis not present

## 2019-04-05 DIAGNOSIS — M5116 Intervertebral disc disorders with radiculopathy, lumbar region: Secondary | ICD-10-CM | POA: Diagnosis not present

## 2019-04-05 DIAGNOSIS — T84216A Breakdown (mechanical) of internal fixation device of vertebrae, initial encounter: Secondary | ICD-10-CM | POA: Diagnosis not present

## 2019-04-05 DIAGNOSIS — M4316 Spondylolisthesis, lumbar region: Secondary | ICD-10-CM | POA: Diagnosis not present

## 2019-04-07 DIAGNOSIS — Z452 Encounter for adjustment and management of vascular access device: Secondary | ICD-10-CM | POA: Diagnosis not present

## 2019-04-07 DIAGNOSIS — M5136 Other intervertebral disc degeneration, lumbar region: Secondary | ICD-10-CM | POA: Diagnosis not present

## 2019-04-07 DIAGNOSIS — M4316 Spondylolisthesis, lumbar region: Secondary | ICD-10-CM | POA: Diagnosis not present

## 2019-04-07 DIAGNOSIS — B9562 Methicillin resistant Staphylococcus aureus infection as the cause of diseases classified elsewhere: Secondary | ICD-10-CM | POA: Diagnosis not present

## 2019-04-07 DIAGNOSIS — T8463XA Infection and inflammatory reaction due to internal fixation device of spine, initial encounter: Secondary | ICD-10-CM | POA: Diagnosis not present

## 2019-04-07 DIAGNOSIS — T84216A Breakdown (mechanical) of internal fixation device of vertebrae, initial encounter: Secondary | ICD-10-CM | POA: Diagnosis not present

## 2019-04-07 DIAGNOSIS — M5116 Intervertebral disc disorders with radiculopathy, lumbar region: Secondary | ICD-10-CM | POA: Diagnosis not present

## 2019-04-08 DIAGNOSIS — T8463XA Infection and inflammatory reaction due to internal fixation device of spine, initial encounter: Secondary | ICD-10-CM | POA: Diagnosis not present

## 2019-04-08 DIAGNOSIS — T84216A Breakdown (mechanical) of internal fixation device of vertebrae, initial encounter: Secondary | ICD-10-CM | POA: Diagnosis not present

## 2019-04-08 DIAGNOSIS — M4316 Spondylolisthesis, lumbar region: Secondary | ICD-10-CM | POA: Diagnosis not present

## 2019-04-08 DIAGNOSIS — M5116 Intervertebral disc disorders with radiculopathy, lumbar region: Secondary | ICD-10-CM | POA: Diagnosis not present

## 2019-04-08 DIAGNOSIS — Z452 Encounter for adjustment and management of vascular access device: Secondary | ICD-10-CM | POA: Diagnosis not present

## 2019-04-08 DIAGNOSIS — B9562 Methicillin resistant Staphylococcus aureus infection as the cause of diseases classified elsewhere: Secondary | ICD-10-CM | POA: Diagnosis not present

## 2019-04-11 DIAGNOSIS — M4316 Spondylolisthesis, lumbar region: Secondary | ICD-10-CM | POA: Diagnosis not present

## 2019-04-11 DIAGNOSIS — M5136 Other intervertebral disc degeneration, lumbar region: Secondary | ICD-10-CM | POA: Diagnosis not present

## 2019-04-11 DIAGNOSIS — T8463XA Infection and inflammatory reaction due to internal fixation device of spine, initial encounter: Secondary | ICD-10-CM | POA: Diagnosis not present

## 2019-04-11 DIAGNOSIS — M5116 Intervertebral disc disorders with radiculopathy, lumbar region: Secondary | ICD-10-CM | POA: Diagnosis not present

## 2019-04-11 DIAGNOSIS — Z452 Encounter for adjustment and management of vascular access device: Secondary | ICD-10-CM | POA: Diagnosis not present

## 2019-04-11 DIAGNOSIS — T84216A Breakdown (mechanical) of internal fixation device of vertebrae, initial encounter: Secondary | ICD-10-CM | POA: Diagnosis not present

## 2019-04-11 DIAGNOSIS — B9562 Methicillin resistant Staphylococcus aureus infection as the cause of diseases classified elsewhere: Secondary | ICD-10-CM | POA: Diagnosis not present

## 2019-04-13 ENCOUNTER — Encounter: Payer: Self-pay | Admitting: Family Medicine

## 2019-04-13 DIAGNOSIS — M5116 Intervertebral disc disorders with radiculopathy, lumbar region: Secondary | ICD-10-CM | POA: Diagnosis not present

## 2019-04-13 DIAGNOSIS — Z452 Encounter for adjustment and management of vascular access device: Secondary | ICD-10-CM | POA: Diagnosis not present

## 2019-04-13 DIAGNOSIS — T84216A Breakdown (mechanical) of internal fixation device of vertebrae, initial encounter: Secondary | ICD-10-CM | POA: Diagnosis not present

## 2019-04-13 DIAGNOSIS — M4316 Spondylolisthesis, lumbar region: Secondary | ICD-10-CM | POA: Diagnosis not present

## 2019-04-13 DIAGNOSIS — T8463XA Infection and inflammatory reaction due to internal fixation device of spine, initial encounter: Secondary | ICD-10-CM | POA: Diagnosis not present

## 2019-04-13 DIAGNOSIS — B9562 Methicillin resistant Staphylococcus aureus infection as the cause of diseases classified elsewhere: Secondary | ICD-10-CM | POA: Diagnosis not present

## 2019-04-14 DIAGNOSIS — B9562 Methicillin resistant Staphylococcus aureus infection as the cause of diseases classified elsewhere: Secondary | ICD-10-CM | POA: Diagnosis not present

## 2019-04-14 DIAGNOSIS — M4316 Spondylolisthesis, lumbar region: Secondary | ICD-10-CM | POA: Diagnosis not present

## 2019-04-14 DIAGNOSIS — T8463XA Infection and inflammatory reaction due to internal fixation device of spine, initial encounter: Secondary | ICD-10-CM | POA: Diagnosis not present

## 2019-04-14 DIAGNOSIS — T84216A Breakdown (mechanical) of internal fixation device of vertebrae, initial encounter: Secondary | ICD-10-CM | POA: Diagnosis not present

## 2019-04-14 DIAGNOSIS — Z452 Encounter for adjustment and management of vascular access device: Secondary | ICD-10-CM | POA: Diagnosis not present

## 2019-04-14 DIAGNOSIS — E119 Type 2 diabetes mellitus without complications: Secondary | ICD-10-CM | POA: Diagnosis not present

## 2019-04-14 DIAGNOSIS — M5116 Intervertebral disc disorders with radiculopathy, lumbar region: Secondary | ICD-10-CM | POA: Diagnosis not present

## 2019-04-14 MED ORDER — AMLODIPINE BESYLATE 5 MG PO TABS: 5.0000 mg | ORAL_TABLET | Freq: Every day | ORAL | 6 refills | Status: DC

## 2019-04-14 NOTE — Telephone Encounter (Signed)
plz schedule in office visit in 1-2 wks for HTN f/u

## 2019-04-15 DIAGNOSIS — T8463XA Infection and inflammatory reaction due to internal fixation device of spine, initial encounter: Secondary | ICD-10-CM | POA: Diagnosis not present

## 2019-04-15 DIAGNOSIS — B9562 Methicillin resistant Staphylococcus aureus infection as the cause of diseases classified elsewhere: Secondary | ICD-10-CM | POA: Diagnosis not present

## 2019-04-15 DIAGNOSIS — T84216A Breakdown (mechanical) of internal fixation device of vertebrae, initial encounter: Secondary | ICD-10-CM | POA: Diagnosis not present

## 2019-04-15 DIAGNOSIS — M4316 Spondylolisthesis, lumbar region: Secondary | ICD-10-CM | POA: Diagnosis not present

## 2019-04-15 DIAGNOSIS — M5116 Intervertebral disc disorders with radiculopathy, lumbar region: Secondary | ICD-10-CM | POA: Diagnosis not present

## 2019-04-15 DIAGNOSIS — Z452 Encounter for adjustment and management of vascular access device: Secondary | ICD-10-CM | POA: Diagnosis not present

## 2019-04-15 NOTE — Telephone Encounter (Signed)
Pt scheduled for 04/26/19 @ 8am

## 2019-04-18 DIAGNOSIS — Z452 Encounter for adjustment and management of vascular access device: Secondary | ICD-10-CM | POA: Diagnosis not present

## 2019-04-18 DIAGNOSIS — M4316 Spondylolisthesis, lumbar region: Secondary | ICD-10-CM | POA: Diagnosis not present

## 2019-04-18 DIAGNOSIS — M5116 Intervertebral disc disorders with radiculopathy, lumbar region: Secondary | ICD-10-CM | POA: Diagnosis not present

## 2019-04-18 DIAGNOSIS — T8463XA Infection and inflammatory reaction due to internal fixation device of spine, initial encounter: Secondary | ICD-10-CM | POA: Diagnosis not present

## 2019-04-18 DIAGNOSIS — B9562 Methicillin resistant Staphylococcus aureus infection as the cause of diseases classified elsewhere: Secondary | ICD-10-CM | POA: Diagnosis not present

## 2019-04-18 DIAGNOSIS — T84216A Breakdown (mechanical) of internal fixation device of vertebrae, initial encounter: Secondary | ICD-10-CM | POA: Diagnosis not present

## 2019-04-19 ENCOUNTER — Telehealth: Payer: Self-pay | Admitting: Internal Medicine

## 2019-04-19 NOTE — Telephone Encounter (Signed)
COVID-19 Pre-Screening Questions:04/19/19 ° °Do you currently have a fever (>100 °F), chills or unexplained body aches? NO ° °Are you currently experiencing new cough, shortness of breath, sore throat, runny nose? NO °•  °Have you recently travelled outside the state of [place] in the last 14 days? NO  °•  °Have you been in contact with someone that is currently pending confirmation of Covid19 testing or has been confirmed to have the Covid19 virus?  NO  ° °**If the patient answers NO to ALL questions -  advise the patient to please call the clinic before coming to the office should any symptoms develop.  ° ° °

## 2019-04-20 ENCOUNTER — Other Ambulatory Visit: Payer: Self-pay

## 2019-04-20 ENCOUNTER — Ambulatory Visit (INDEPENDENT_AMBULATORY_CARE_PROVIDER_SITE_OTHER): Payer: Medicare Other | Admitting: Internal Medicine

## 2019-04-20 VITALS — Ht 64.0 in

## 2019-04-20 DIAGNOSIS — Z452 Encounter for adjustment and management of vascular access device: Secondary | ICD-10-CM | POA: Diagnosis not present

## 2019-04-20 DIAGNOSIS — M4316 Spondylolisthesis, lumbar region: Secondary | ICD-10-CM | POA: Diagnosis not present

## 2019-04-20 DIAGNOSIS — B9562 Methicillin resistant Staphylococcus aureus infection as the cause of diseases classified elsewhere: Secondary | ICD-10-CM | POA: Diagnosis not present

## 2019-04-20 DIAGNOSIS — A4902 Methicillin resistant Staphylococcus aureus infection, unspecified site: Secondary | ICD-10-CM | POA: Diagnosis not present

## 2019-04-20 DIAGNOSIS — T847XXD Infection and inflammatory reaction due to other internal orthopedic prosthetic devices, implants and grafts, subsequent encounter: Secondary | ICD-10-CM

## 2019-04-20 DIAGNOSIS — T84216A Breakdown (mechanical) of internal fixation device of vertebrae, initial encounter: Secondary | ICD-10-CM | POA: Diagnosis not present

## 2019-04-20 DIAGNOSIS — M4626 Osteomyelitis of vertebra, lumbar region: Secondary | ICD-10-CM | POA: Diagnosis not present

## 2019-04-20 DIAGNOSIS — T8463XA Infection and inflammatory reaction due to internal fixation device of spine, initial encounter: Secondary | ICD-10-CM | POA: Diagnosis not present

## 2019-04-20 DIAGNOSIS — M5116 Intervertebral disc disorders with radiculopathy, lumbar region: Secondary | ICD-10-CM | POA: Diagnosis not present

## 2019-04-21 ENCOUNTER — Telehealth: Payer: Self-pay

## 2019-04-21 ENCOUNTER — Other Ambulatory Visit: Payer: Self-pay | Admitting: Internal Medicine

## 2019-04-21 DIAGNOSIS — Z792 Long term (current) use of antibiotics: Secondary | ICD-10-CM | POA: Diagnosis not present

## 2019-04-21 DIAGNOSIS — B9562 Methicillin resistant Staphylococcus aureus infection as the cause of diseases classified elsewhere: Secondary | ICD-10-CM | POA: Diagnosis not present

## 2019-04-21 DIAGNOSIS — M48062 Spinal stenosis, lumbar region with neurogenic claudication: Secondary | ICD-10-CM | POA: Diagnosis not present

## 2019-04-21 DIAGNOSIS — T84216A Breakdown (mechanical) of internal fixation device of vertebrae, initial encounter: Secondary | ICD-10-CM | POA: Diagnosis not present

## 2019-04-21 DIAGNOSIS — M5116 Intervertebral disc disorders with radiculopathy, lumbar region: Secondary | ICD-10-CM | POA: Diagnosis not present

## 2019-04-21 DIAGNOSIS — A4902 Methicillin resistant Staphylococcus aureus infection, unspecified site: Secondary | ICD-10-CM

## 2019-04-21 DIAGNOSIS — Z452 Encounter for adjustment and management of vascular access device: Secondary | ICD-10-CM | POA: Diagnosis not present

## 2019-04-21 DIAGNOSIS — M4316 Spondylolisthesis, lumbar region: Secondary | ICD-10-CM | POA: Diagnosis not present

## 2019-04-21 DIAGNOSIS — T8463XA Infection and inflammatory reaction due to internal fixation device of spine, initial encounter: Secondary | ICD-10-CM | POA: Diagnosis not present

## 2019-04-21 NOTE — Telephone Encounter (Signed)
Rochele Raring home health nurse called office today to inform us patient's picc is having some resistance during flushes. RN is able to administer IV antibiotics still, and is obtaining labs through venipuncture. RN states that Cathflow is not covered by insurance at home, and would like to have a plan in place incase antibiotics is not able to be administered due to resistance.  Will route message to MD to advise. Inez Catalina, Home health nurse P: [place], [place]

## 2019-04-22 ENCOUNTER — Encounter: Payer: Self-pay | Admitting: Family Medicine

## 2019-04-22 NOTE — Telephone Encounter (Signed)
Patient also sent myChart message asking for refills. Routing to provided for advise.  Amy Mcalpine, LPN

## 2019-04-22 NOTE — Telephone Encounter (Signed)
Patient scheduled for cathflo at [facility] on 7/28 at 10:00.  Patient notified, accepted the appointment.  Address: [place], 3rd floor  Phone: [phone]. She knows to go to the ER if she is unable to infuse antibiotics in the mean time.  Notified Mary at [facility]. She will relay message to nursing as well. Will fax orders to [facility] at [phone]. Landis Gandy, RN

## 2019-04-25 ENCOUNTER — Telehealth: Payer: Self-pay | Admitting: Family Medicine

## 2019-04-25 DIAGNOSIS — Z6841 Body Mass Index (BMI) 40.0 and over, adult: Secondary | ICD-10-CM | POA: Diagnosis not present

## 2019-04-25 DIAGNOSIS — I700 Atherosclerosis of aorta: Secondary | ICD-10-CM | POA: Diagnosis not present

## 2019-04-25 DIAGNOSIS — B9562 Methicillin resistant Staphylococcus aureus infection as the cause of diseases classified elsewhere: Secondary | ICD-10-CM | POA: Diagnosis not present

## 2019-04-25 DIAGNOSIS — M170 Bilateral primary osteoarthritis of knee: Secondary | ICD-10-CM | POA: Diagnosis not present

## 2019-04-25 DIAGNOSIS — G4733 Obstructive sleep apnea (adult) (pediatric): Secondary | ICD-10-CM | POA: Diagnosis not present

## 2019-04-25 DIAGNOSIS — M48062 Spinal stenosis, lumbar region with neurogenic claudication: Secondary | ICD-10-CM | POA: Diagnosis not present

## 2019-04-25 DIAGNOSIS — I10 Essential (primary) hypertension: Secondary | ICD-10-CM | POA: Diagnosis not present

## 2019-04-25 DIAGNOSIS — M4316 Spondylolisthesis, lumbar region: Secondary | ICD-10-CM | POA: Diagnosis not present

## 2019-04-25 DIAGNOSIS — Z5181 Encounter for therapeutic drug level monitoring: Secondary | ICD-10-CM | POA: Diagnosis not present

## 2019-04-25 DIAGNOSIS — M5116 Intervertebral disc disorders with radiculopathy, lumbar region: Secondary | ICD-10-CM | POA: Diagnosis not present

## 2019-04-25 DIAGNOSIS — I2510 Atherosclerotic heart disease of native coronary artery without angina pectoris: Secondary | ICD-10-CM | POA: Diagnosis not present

## 2019-04-25 DIAGNOSIS — Z792 Long term (current) use of antibiotics: Secondary | ICD-10-CM | POA: Diagnosis not present

## 2019-04-25 DIAGNOSIS — G2581 Restless legs syndrome: Secondary | ICD-10-CM | POA: Diagnosis not present

## 2019-04-25 DIAGNOSIS — D649 Anemia, unspecified: Secondary | ICD-10-CM | POA: Diagnosis not present

## 2019-04-25 DIAGNOSIS — Z452 Encounter for adjustment and management of vascular access device: Secondary | ICD-10-CM | POA: Diagnosis not present

## 2019-04-25 DIAGNOSIS — T8463XA Infection and inflammatory reaction due to internal fixation device of spine, initial encounter: Secondary | ICD-10-CM | POA: Diagnosis not present

## 2019-04-25 DIAGNOSIS — K219 Gastro-esophageal reflux disease without esophagitis: Secondary | ICD-10-CM | POA: Diagnosis not present

## 2019-04-25 DIAGNOSIS — F419 Anxiety disorder, unspecified: Secondary | ICD-10-CM | POA: Diagnosis not present

## 2019-04-25 DIAGNOSIS — T84216A Breakdown (mechanical) of internal fixation device of vertebrae, initial encounter: Secondary | ICD-10-CM | POA: Diagnosis not present

## 2019-04-25 DIAGNOSIS — E785 Hyperlipidemia, unspecified: Secondary | ICD-10-CM | POA: Diagnosis not present

## 2019-04-25 DIAGNOSIS — Z7982 Long term (current) use of aspirin: Secondary | ICD-10-CM | POA: Diagnosis not present

## 2019-04-25 DIAGNOSIS — F319 Bipolar disorder, unspecified: Secondary | ICD-10-CM | POA: Diagnosis not present

## 2019-04-25 DIAGNOSIS — J432 Centrilobular emphysema: Secondary | ICD-10-CM | POA: Diagnosis not present

## 2019-04-25 DIAGNOSIS — E119 Type 2 diabetes mellitus without complications: Secondary | ICD-10-CM | POA: Diagnosis not present

## 2019-04-25 NOTE — Telephone Encounter (Signed)
Amy Barnes with [facility] health called in regards to orders they received and they are missing the doctor signature. They would like to know if these can be signed by him and faxed back over.  [place] phone- [phone]  Fax- [phone]

## 2019-04-25 NOTE — Telephone Encounter (Signed)
Returned call to [place].  Says she faxed orders today needing Dr. Synthia Innocent sig & date.

## 2019-04-26 ENCOUNTER — Ambulatory Visit (HOSPITAL_COMMUNITY)
Admission: RE | Admit: 2019-04-26 | Discharge: 2019-04-26 | Disposition: A | Discharge status: Home / Self Care | Payer: Medicare Other | Source: Ambulatory Visit | Attending: Internal Medicine | Admitting: Internal Medicine

## 2019-04-26 ENCOUNTER — Telehealth: Payer: Self-pay | Admitting: *Deleted

## 2019-04-26 ENCOUNTER — Ambulatory Visit (INDEPENDENT_AMBULATORY_CARE_PROVIDER_SITE_OTHER): Payer: Medicare Other | Admitting: Family Medicine

## 2019-04-26 ENCOUNTER — Other Ambulatory Visit: Payer: Self-pay | Admitting: *Deleted

## 2019-04-26 ENCOUNTER — Encounter: Payer: Self-pay | Admitting: Family Medicine

## 2019-04-26 ENCOUNTER — Other Ambulatory Visit: Payer: Self-pay

## 2019-04-26 VITALS — BP 140/84 | HR 90 | Temp 98.0°F | Ht 63.5 in

## 2019-04-26 DIAGNOSIS — I700 Atherosclerosis of aorta: Secondary | ICD-10-CM

## 2019-04-26 DIAGNOSIS — T84216A Breakdown (mechanical) of internal fixation device of vertebrae, initial encounter: Secondary | ICD-10-CM | POA: Diagnosis not present

## 2019-04-26 DIAGNOSIS — Z791 Long term (current) use of non-steroidal anti-inflammatories (NSAID): Secondary | ICD-10-CM

## 2019-04-26 DIAGNOSIS — E1142 Type 2 diabetes mellitus with diabetic polyneuropathy: Secondary | ICD-10-CM

## 2019-04-26 DIAGNOSIS — Z5181 Encounter for therapeutic drug level monitoring: Secondary | ICD-10-CM

## 2019-04-26 DIAGNOSIS — M48062 Spinal stenosis, lumbar region with neurogenic claudication: Secondary | ICD-10-CM

## 2019-04-26 DIAGNOSIS — E785 Hyperlipidemia, unspecified: Secondary | ICD-10-CM

## 2019-04-26 DIAGNOSIS — T847XXA Infection and inflammatory reaction due to other internal orthopedic prosthetic devices, implants and grafts, initial encounter: Secondary | ICD-10-CM

## 2019-04-26 DIAGNOSIS — I10 Essential (primary) hypertension: Secondary | ICD-10-CM

## 2019-04-26 DIAGNOSIS — Z79891 Long term (current) use of opiate analgesic: Secondary | ICD-10-CM

## 2019-04-26 DIAGNOSIS — M5116 Intervertebral disc disorders with radiculopathy, lumbar region: Secondary | ICD-10-CM

## 2019-04-26 DIAGNOSIS — D649 Anemia, unspecified: Secondary | ICD-10-CM

## 2019-04-26 DIAGNOSIS — E7800 Pure hypercholesterolemia, unspecified: Secondary | ICD-10-CM

## 2019-04-26 DIAGNOSIS — Z792 Long term (current) use of antibiotics: Secondary | ICD-10-CM

## 2019-04-26 DIAGNOSIS — Z9181 History of falling: Secondary | ICD-10-CM

## 2019-04-26 DIAGNOSIS — Z7982 Long term (current) use of aspirin: Secondary | ICD-10-CM

## 2019-04-26 DIAGNOSIS — Z96643 Presence of artificial hip joint, bilateral: Secondary | ICD-10-CM

## 2019-04-26 DIAGNOSIS — B9562 Methicillin resistant Staphylococcus aureus infection as the cause of diseases classified elsewhere: Secondary | ICD-10-CM | POA: Diagnosis not present

## 2019-04-26 DIAGNOSIS — K219 Gastro-esophageal reflux disease without esophagitis: Secondary | ICD-10-CM

## 2019-04-26 DIAGNOSIS — M170 Bilateral primary osteoarthritis of knee: Secondary | ICD-10-CM

## 2019-04-26 DIAGNOSIS — Z452 Encounter for adjustment and management of vascular access device: Secondary | ICD-10-CM | POA: Diagnosis not present

## 2019-04-26 DIAGNOSIS — T8463XA Infection and inflammatory reaction due to internal fixation device of spine, initial encounter: Secondary | ICD-10-CM | POA: Diagnosis not present

## 2019-04-26 DIAGNOSIS — Z6841 Body Mass Index (BMI) 40.0 and over, adult: Secondary | ICD-10-CM

## 2019-04-26 DIAGNOSIS — F419 Anxiety disorder, unspecified: Secondary | ICD-10-CM

## 2019-04-26 DIAGNOSIS — T847XXD Infection and inflammatory reaction due to other internal orthopedic prosthetic devices, implants and grafts, subsequent encounter: Secondary | ICD-10-CM

## 2019-04-26 DIAGNOSIS — J432 Centrilobular emphysema: Secondary | ICD-10-CM

## 2019-04-26 DIAGNOSIS — F319 Bipolar disorder, unspecified: Secondary | ICD-10-CM

## 2019-04-26 DIAGNOSIS — G4733 Obstructive sleep apnea (adult) (pediatric): Secondary | ICD-10-CM

## 2019-04-26 DIAGNOSIS — Z8701 Personal history of pneumonia (recurrent): Secondary | ICD-10-CM

## 2019-04-26 DIAGNOSIS — Z90710 Acquired absence of both cervix and uterus: Secondary | ICD-10-CM

## 2019-04-26 DIAGNOSIS — I2510 Atherosclerotic heart disease of native coronary artery without angina pectoris: Secondary | ICD-10-CM

## 2019-04-26 DIAGNOSIS — Z87891 Personal history of nicotine dependence: Secondary | ICD-10-CM

## 2019-04-26 DIAGNOSIS — E119 Type 2 diabetes mellitus without complications: Secondary | ICD-10-CM

## 2019-04-26 DIAGNOSIS — M4316 Spondylolisthesis, lumbar region: Secondary | ICD-10-CM

## 2019-04-26 DIAGNOSIS — G2581 Restless legs syndrome: Secondary | ICD-10-CM

## 2019-04-26 MED ORDER — FLUCONAZOLE 150 MG PO TABS: ORAL_TABLET | ORAL | 1 refills | Status: DC

## 2019-04-26 MED ORDER — ALTEPLASE 2 MG IJ SOLR
2.0000 mg | Freq: Once | INTRAMUSCULAR | Status: AC
  Administered 2019-04-26: 2 mg
  Filled 2019-04-26: qty 2

## 2019-04-26 NOTE — Assessment & Plan Note (Signed)
Improved control with addition of amlodipine. Will continue current regimen.

## 2019-04-26 NOTE — Assessment & Plan Note (Signed)
Stable off medication - only diet controlled. Did recently complete DSME 02/2019 at [facility]. Congratulated on good control to date.

## 2019-04-26 NOTE — Assessment & Plan Note (Signed)
Lumbar MRSA infection planned 8 wks IV vanc managed by ID. Has PICC in place.

## 2019-04-26 NOTE — Telephone Encounter (Signed)
Caryl Pina RN at infusion center at [place] called to advise that IV team found that patients port is out about 4 cm and they would recommend that the patient has a chest xray to check placement advised will let the provider know and get back to her.  Spoke with Dr Baxter Flattery and was given verbal order to have patient chest xray done to check placement.  Called Caryl Pina and advised of order being placed and she will have patient go to imaging at [place] prior to leaving campus.  Order placed and will wait for results.

## 2019-04-26 NOTE — Telephone Encounter (Signed)
This should go to Dr Baxter Flattery who is managing the vancomycin. We faxed requested orders to her office yesterday.

## 2019-04-26 NOTE — Assessment & Plan Note (Addendum)
Simvastatin was stopped during recent hospitalization. Will check FLP next labwork in 3-4 months then decide on treatment plan.

## 2019-04-26 NOTE — Patient Instructions (Signed)
Return in 3-4 months for follow up visit with fasting labs.  Blood pressures are improved - continue current regimen.  Good to see you today.

## 2019-04-26 NOTE — Progress Notes (Signed)
PATIENT CARE CENTER NOTE  Provider: Carlyle Basques, MD   Procedure: Cathflo for PICC line   Note: Patient arrived for PICC line flush with Cathflo. IV team called and administered Cathflo. Upon assessment, IV team RN noticed that patient's PICC line was out 4 cm further since original placement and recommended chest x-ray to check placement. Infectious Disease Center notified and updated on IV teams recommendation. Dr. Baxter Flattery placed order for patient to have chest x-ray after PICC flush. After 2 hours IV team returned and were able to flush and get blood return from PICC. Discharge instructions given. Patient advised to report to Radiology for chest x-ray to check placement of PICC. Alert, oriented and ambulatory at discharge.

## 2019-04-26 NOTE — Progress Notes (Signed)
This visit was conducted in person.  BP 140/84 (BP Location: Left Arm, Patient Position: Sitting) Comment (Cuff Size): Thigh   Pulse 90    Temp 98 F (36.7 C) (Temporal)    Ht 5' 3.5" (1.613 m)    LMP  (LMP Unknown)    SpO2 94%    BMI 64.72 kg/m    CC: HTN f/u Subjective:    Patient ID: Amy Barnes, female    DOB: 1951/02/21, 68 y.o.   MRN: [id number]  HPI: Amy Barnes is a 68 y.o. female presenting on 04/26/2019 for Hypertension (Here for 1-2 wk f/u.)   Recent hospitalization for repeat lumbar surgery for pseudoarthrosis found to have MRSA wound infection now on prolonged IV vanc through PICC managed by ID. Currently on vancomycin '1250mg'$  BID as well as rifampin. Levels are checked twice weekly. Takes tylenol for pain along with oxycodone or tramadol and robaxin. Simvastatin was stopped during hospitalization - due to some interaction of which I'm unsure.   HTN - Compliant with current antihypertensive regimen of amlodipine '5mg'$  daily, losartan '100mg'$  daily, lasix '40mg'$  daily PRN (takes about QOD). Does check blood pressures at home: <140/90. No low blood pressure readings or symptoms of dizziness/syncope. Denies HA, vision changes, CP/tightness, SOB, leg swelling.   DM - does regularly check sugars every few days. Compliant with antihyperglycemic regimen which includes: diet controlled. Denies low sugars or hypoglycemic symptoms. Denies paresthesias. Last diabetic eye exam due - postponed recently. Pneumovax: 2013. Prevnar: 2019. Glucometer brand: [facility] brand. DSME: 02/2019. Lab Results  Component Value Date   HGBA1C 6.5 (H) 03/24/2019   Diabetic Foot Exam - Simple   No data filed     Lab Results  Component Value Date   MICROALBUR 0.3 08/19/2010        Relevant past medical, surgical, family and social history reviewed and updated as indicated. Interim medical history since our last visit reviewed. Allergies and medications reviewed and updated. Outpatient Medications Prior to  Visit  Medication Sig Dispense Refill   albuterol (PROVENTIL HFA;VENTOLIN HFA) 108 (90 Base) MCG/ACT inhaler Inhale 2 puffs into the lungs every 6 (six) hours as needed for wheezing or shortness of breath. 1 Inhaler 6   amLODipine (NORVASC) 5 MG tablet Take 1 tablet (5 mg total) by mouth daily. 30 tablet 6   aspirin 81 MG EC tablet Take 81 mg by mouth at bedtime.      celecoxib (CELEBREX) 200 MG capsule Take 1 capsule (200 mg total) by mouth daily. 90 capsule 1   FLUoxetine (PROZAC) 40 MG capsule Take by mouth daily.     Fluticasone-Salmeterol (ADVAIR) 250-50 MCG/DOSE AEPB Inhale 2 puffs into the lungs at bedtime.      furosemide (LASIX) 40 MG tablet Take 40 mg by mouth daily.      lamoTRIgine (LAMICTAL) 200 MG tablet Take 200 mg by mouth daily.       levothyroxine (SYNTHROID) 150 MCG tablet Take 1 tablet (150 mcg total) by mouth at bedtime. 90 tablet 3   losartan (COZAAR) 100 MG tablet Take 1 tablet (100 mg total) by mouth daily. 90 tablet 1   methocarbamol (ROBAXIN-750) 750 MG tablet Take 1 tablet (750 mg total) by mouth every 6 (six) hours as needed for muscle spasms. 28 tablet 0   montelukast (SINGULAIR) 10 MG tablet TAKE 1 TABLET BY MOUTH EVERYDAY AT BEDTIME (Patient taking differently: Take 10 mg by mouth at bedtime. ) 90 tablet 2   Multiple Vitamin (MULTIVITAMIN WITH  MINERALS) TABS tablet Take 2 tablets by mouth daily.      nystatin (MYCOSTATIN/NYSTOP) powder Apply topically 4 (four) times daily. 60 g 3   ondansetron (ZOFRAN) 8 MG tablet Take 8 mg by mouth every 8 (eight) hours as needed for nausea or vomiting.      Oxcarbazepine (TRILEPTAL) 300 MG tablet Take 300-600 mg by mouth See admin instructions. Take 300 mg by mouth in the morning and 600 mg at night     oxyCODONE 10 MG TABS Take 1 tablet (10 mg total) by mouth every 4 (four) hours as needed for severe pain ((score 7 to 10)). 42 tablet 0   pantoprazole (PROTONIX) 40 MG tablet Take 1 tablet (40 mg total) by mouth  daily. 90 tablet 3   promethazine (PHENERGAN) 25 MG tablet Take 25 mg by mouth every 6 (six) hours as needed for nausea or vomiting.     rifampin (RIFADIN) 300 MG capsule TAKE 1 CAPSULE (300 MG TOTAL) BY MOUTH EVERY 12 (TWELVE) HOURS. 84 capsule 0   vancomycin IVPB Inject 2,250 mg into the vein daily. Indication:  MRSA Wound Infection  Last Day of Therapy:  05/19/2019 Labs - Sunday/Monday:  CBC/D, BMP, and vancomycin trough. Labs - Thursday:  BMP and vancomycin trough Labs - Every other week:  ESR and CRP 55 Units 0   fluconazole (DIFLUCAN) 150 MG tablet TAKE 1 TABLET BY MOUTH ONCE FOR YEAST INFECTION REPEAT IN 72 HOURS.AS NEEDED 2 tablet 1   doxycycline (VIBRA-TABS) 100 MG tablet TAKE 1 TABLET (100 MG TOTAL) BY MOUTH AT BEDTIME. (Patient not taking: No sig reported) 90 tablet 1   oxybutynin (DITROPAN XL) 10 MG 24 hr tablet Take 1 tablet (10 mg total) by mouth at bedtime. (Patient not taking: Reported on 04/26/2019) 30 tablet 11   docusate sodium (COLACE) 100 MG capsule Take 1 capsule (100 mg total) by mouth 2 (two) times daily. (Patient not taking: Reported on 04/20/2019) 10 capsule 0   No facility-administered medications prior to visit.      Per HPI unless specifically indicated in ROS section below Review of Systems Objective:    BP 140/84 (BP Location: Left Arm, Patient Position: Sitting) Comment (Cuff Size): Thigh   Pulse 90    Temp 98 F (36.7 C) (Temporal)    Ht 5' 3.5" (1.613 m)    LMP  (LMP Unknown)    SpO2 94%    BMI 64.72 kg/m   Wt Readings from Last 3 Encounters:  03/23/19 (!) 371 lb 3.2 oz (168.4 kg)  03/18/19 (!) 371 lb 3.2 oz (168.4 kg)  03/04/19 (!) 362 lb (164.2 kg)    Physical Exam Vitals signs and nursing note reviewed.  Constitutional:      Appearance: Normal appearance. She is obese.  HENT:     Mouth/Throat:     Mouth: Mucous membranes are moist.     Pharynx: No posterior oropharyngeal erythema.  Eyes:     Extraocular Movements: Extraocular movements  intact.     Pupils: Pupils are equal, round, and reactive to light.  Cardiovascular:     Rate and Rhythm: Normal rate and regular rhythm.     Pulses: Normal pulses.     Heart sounds: Normal heart sounds. No murmur.  Pulmonary:     Effort: Pulmonary effort is normal. No respiratory distress.     Breath sounds: Normal breath sounds. No wheezing, rhonchi or rales.  Musculoskeletal:     Right lower leg: No edema.  Left lower leg: No edema.  Neurological:     Mental Status: She is alert.  Psychiatric:        Mood and Affect: Mood normal.        Behavior: Behavior normal.       Assessment & Plan:  Requests diflucan refilled given propensity to develop yeast infections when on antibiotics. Currently not affected.  Problem List Items Addressed This Visit    Wound infection complicating hardware ([place])    Lumbar MRSA infection planned 8 wks IV vanc managed by ID. Has PICC in place.       Relevant Medications   fluconazole (DIFLUCAN) 150 MG tablet   Hypercholesterolemia    Simvastatin was stopped during recent hospitalization. Will check FLP next labwork in 3-4 months then decide on treatment plan.       Essential hypertension - Primary    Improved control with addition of amlodipine. Will continue current regimen.       DM type 2 with diabetic peripheral neuropathy (HCC)    Stable off medication - only diet controlled. Did recently complete DSME 02/2019 [facility]. Congratulated on good control to date.           Meds ordered this encounter  Medications   fluconazole (DIFLUCAN) 150 MG tablet    Sig: TAKE 1 TABLET BY MOUTH ONCE FOR YEAST INFECTION REPEAT IN 72 HOURS.AS NEEDED    Dispense:  2 tablet    Refill:  1   No orders of the defined types were placed in this encounter.   Follow up plan: Return in about 3 months (around 07/27/2019) for follow up visit.  Ria Bush, MD

## 2019-04-26 NOTE — Telephone Encounter (Signed)
Spoke with Mariann Laster relaying Dr. Synthia Innocent message.  Verbalizes understanding and says she will check on it.

## 2019-04-27 ENCOUNTER — Ambulatory Visit: Payer: Medicare Other | Admitting: Urology

## 2019-04-28 DIAGNOSIS — B9562 Methicillin resistant Staphylococcus aureus infection as the cause of diseases classified elsewhere: Secondary | ICD-10-CM | POA: Diagnosis not present

## 2019-04-28 DIAGNOSIS — M5116 Intervertebral disc disorders with radiculopathy, lumbar region: Secondary | ICD-10-CM | POA: Diagnosis not present

## 2019-04-28 DIAGNOSIS — T8463XA Infection and inflammatory reaction due to internal fixation device of spine, initial encounter: Secondary | ICD-10-CM | POA: Diagnosis not present

## 2019-04-28 DIAGNOSIS — Z452 Encounter for adjustment and management of vascular access device: Secondary | ICD-10-CM | POA: Diagnosis not present

## 2019-04-28 DIAGNOSIS — T84216A Breakdown (mechanical) of internal fixation device of vertebrae, initial encounter: Secondary | ICD-10-CM | POA: Diagnosis not present

## 2019-04-28 DIAGNOSIS — M4316 Spondylolisthesis, lumbar region: Secondary | ICD-10-CM | POA: Diagnosis not present

## 2019-04-29 ENCOUNTER — Encounter (HOSPITAL_COMMUNITY): Payer: Medicare Other

## 2019-04-29 DIAGNOSIS — B9562 Methicillin resistant Staphylococcus aureus infection as the cause of diseases classified elsewhere: Secondary | ICD-10-CM | POA: Diagnosis not present

## 2019-04-29 DIAGNOSIS — T8463XA Infection and inflammatory reaction due to internal fixation device of spine, initial encounter: Secondary | ICD-10-CM | POA: Diagnosis not present

## 2019-04-29 DIAGNOSIS — M5116 Intervertebral disc disorders with radiculopathy, lumbar region: Secondary | ICD-10-CM | POA: Diagnosis not present

## 2019-04-29 DIAGNOSIS — T84216A Breakdown (mechanical) of internal fixation device of vertebrae, initial encounter: Secondary | ICD-10-CM | POA: Diagnosis not present

## 2019-04-29 DIAGNOSIS — Z452 Encounter for adjustment and management of vascular access device: Secondary | ICD-10-CM | POA: Diagnosis not present

## 2019-04-29 DIAGNOSIS — M4316 Spondylolisthesis, lumbar region: Secondary | ICD-10-CM | POA: Diagnosis not present

## 2019-05-02 DIAGNOSIS — T84216A Breakdown (mechanical) of internal fixation device of vertebrae, initial encounter: Secondary | ICD-10-CM | POA: Diagnosis not present

## 2019-05-02 DIAGNOSIS — T8463XA Infection and inflammatory reaction due to internal fixation device of spine, initial encounter: Secondary | ICD-10-CM | POA: Diagnosis not present

## 2019-05-02 DIAGNOSIS — M5116 Intervertebral disc disorders with radiculopathy, lumbar region: Secondary | ICD-10-CM | POA: Diagnosis not present

## 2019-05-02 DIAGNOSIS — M4316 Spondylolisthesis, lumbar region: Secondary | ICD-10-CM | POA: Diagnosis not present

## 2019-05-02 DIAGNOSIS — Z452 Encounter for adjustment and management of vascular access device: Secondary | ICD-10-CM | POA: Diagnosis not present

## 2019-05-02 DIAGNOSIS — B9562 Methicillin resistant Staphylococcus aureus infection as the cause of diseases classified elsewhere: Secondary | ICD-10-CM | POA: Diagnosis not present

## 2019-05-04 DIAGNOSIS — Z452 Encounter for adjustment and management of vascular access device: Secondary | ICD-10-CM | POA: Diagnosis not present

## 2019-05-04 DIAGNOSIS — B9562 Methicillin resistant Staphylococcus aureus infection as the cause of diseases classified elsewhere: Secondary | ICD-10-CM | POA: Diagnosis not present

## 2019-05-04 DIAGNOSIS — T8463XA Infection and inflammatory reaction due to internal fixation device of spine, initial encounter: Secondary | ICD-10-CM | POA: Diagnosis not present

## 2019-05-04 DIAGNOSIS — T84216A Breakdown (mechanical) of internal fixation device of vertebrae, initial encounter: Secondary | ICD-10-CM | POA: Diagnosis not present

## 2019-05-04 DIAGNOSIS — M5116 Intervertebral disc disorders with radiculopathy, lumbar region: Secondary | ICD-10-CM | POA: Diagnosis not present

## 2019-05-04 DIAGNOSIS — M4316 Spondylolisthesis, lumbar region: Secondary | ICD-10-CM | POA: Diagnosis not present

## 2019-05-05 NOTE — Progress Notes (Signed)
RFV: hospital follow up for MRSA lumbar osteo with HW involvement Patient ID: Amy Barnes, female   DOB: 02/19/1951, 68 y.o.   MRN: [id number]  HPI Amy Barnes is a 68 y.o. female with hx of L3-L4 decompression fusion who had failed medical management for recurrent back/leg pain. She had ongoing lumbar pseudoarthrosis and L4-L5 spondylolisthesis on imaging to explain lumbar radiculopathy and neurogenic claudication. She underwent decompression PLIF and exploration of lumbar fusion/removal of old lumbar hardware. Possbily loosening of hw noted. Cultures sent from OR which showed MRSA. She was discharged on iv vancomycin and oral rifampin for which she is tolerating. She is slowing improving.   No rash, n/v/, diarrhea from medication  Outpatient Encounter Medications as of 04/20/2019  Medication Sig  . albuterol (PROVENTIL HFA;VENTOLIN HFA) 108 (90 Base) MCG/ACT inhaler Inhale 2 puffs into the lungs every 6 (six) hours as needed for wheezing or shortness of breath.  Marland Kitchen amLODipine (NORVASC) 5 MG tablet Take 1 tablet (5 mg total) by mouth daily.  Marland Kitchen aspirin 81 MG EC tablet Take 81 mg by mouth at bedtime.   . celecoxib (CELEBREX) 200 MG capsule Take 1 capsule (200 mg total) by mouth daily.  Marland Kitchen doxycycline (VIBRA-TABS) 100 MG tablet TAKE 1 TABLET (100 MG TOTAL) BY MOUTH AT BEDTIME. (Patient not taking: No sig reported)  . FLUoxetine (PROZAC) 40 MG capsule Take by mouth daily.  . Fluticasone-Salmeterol (ADVAIR) 250-50 MCG/DOSE AEPB Inhale 2 puffs into the lungs at bedtime.   . furosemide (LASIX) 40 MG tablet Take 40 mg by mouth daily.   Marland Kitchen lamoTRIgine (LAMICTAL) 200 MG tablet Take 200 mg by mouth daily.    Marland Kitchen levothyroxine (SYNTHROID) 150 MCG tablet Take 1 tablet (150 mcg total) by mouth at bedtime.  Marland Kitchen losartan (COZAAR) 100 MG tablet Take 1 tablet (100 mg total) by mouth daily.  . montelukast (SINGULAIR) 10 MG tablet TAKE 1 TABLET BY MOUTH EVERYDAY AT BEDTIME (Patient taking differently: Take 10 mg  by mouth at bedtime. )  . Multiple Vitamin (MULTIVITAMIN WITH MINERALS) TABS tablet Take 2 tablets by mouth daily.   . ondansetron (ZOFRAN) 8 MG tablet Take 8 mg by mouth every 8 (eight) hours as needed for nausea or vomiting.   Marland Kitchen oxybutynin (DITROPAN XL) 10 MG 24 hr tablet Take 1 tablet (10 mg total) by mouth at bedtime. (Patient not taking: Reported on 04/26/2019)  . oxyCODONE 10 MG TABS Take 1 tablet (10 mg total) by mouth every 4 (four) hours as needed for severe pain ((score 7 to 10)).  Marland Kitchen pantoprazole (PROTONIX) 40 MG tablet Take 1 tablet (40 mg total) by mouth daily.  . promethazine (PHENERGAN) 25 MG tablet Take 25 mg by mouth every 6 (six) hours as needed for nausea or vomiting.  . vancomycin IVPB Inject 2,250 mg into the vein daily. Indication:  MRSA Wound Infection  Last Day of Therapy:  05/19/2019 Labs - Sunday/Monday:  CBC/D, BMP, and vancomycin trough. Labs - Thursday:  BMP and vancomycin trough Labs - Every other week:  ESR and CRP  . [DISCONTINUED] rifampin (RIFADIN) 300 MG capsule Take 1 capsule (300 mg total) by mouth every 12 (twelve) hours.  . methocarbamol (ROBAXIN-750) 750 MG tablet Take 1 tablet (750 mg total) by mouth every 6 (six) hours as needed for muscle spasms.  Marland Kitchen nystatin (MYCOSTATIN/NYSTOP) powder Apply topically 4 (four) times daily.  . Oxcarbazepine (TRILEPTAL) 300 MG tablet Take 300-600 mg by mouth See admin instructions. Take 300 mg by mouth  in the morning and 600 mg at night  . [DISCONTINUED] docusate sodium (COLACE) 100 MG capsule Take 1 capsule (100 mg total) by mouth 2 (two) times daily. (Patient not taking: Reported on 04/20/2019)  . [DISCONTINUED] fluconazole (DIFLUCAN) 150 MG tablet TAKE 1 TABLET BY MOUTH ONCE FOR YEAST INFECTION REPEAT IN 72 HOURS.AS NEEDED  . [DISCONTINUED] FLUoxetine (PROZAC) 20 MG capsule    No facility-administered encounter medications on file as of 04/20/2019.      Patient Active Problem List   Diagnosis Date Noted  . Wound  infection complicating hardware ([place]) 04/01/2019  . 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 Bilateral lower extremity edema 11/29/2018  . Neurogenic urinary incontinence 11/29/2018  . Urinary incontinence 11/29/2018  . Bladder spasms 11/29/2018  . Discogenic low back pain 11/29/2018  . Chronic sacroiliac joint pain (Bilateral) (R>L) 11/29/2018  . Spondylosis without myelopathy or radiculopathy, lumbar region 11/09/2018  . Chronic low back pain (Bilateral) (L>R) w/o sciatica 11/09/2018  . History of hip replacement (Left) 10/13/2018  . History of back surgery 10/13/2018  . Vitamin D insufficiency 10/13/2018  . Osteoarthritis of facet joint of lumbar spine 10/13/2018  . Osteoarthritis involving multiple joints 10/13/2018  . Cervical radiculopathy (Left) 10/13/2018  . Lumbar facet arthropathy (Bilateral) 10/13/2018  . Lumbar facet syndrome (Bilateral) (L>R) 10/13/2018  . Abnormal MRI, lumbar spine (09/16/2018) 10/13/2018  . Lumbar nerve root compression (L4) (Left) 10/13/2018  . Lumbar foraminal stenosis 10/13/2018  . 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 Chronic low back pain (Primary Area of Pain) (Bilateral) (L>R) w/ sciatica (Left) 09/14/2018  . Chronic lower extremity pain (Secondary Area of Pain) (Left) 09/14/2018  . Chronic hip pain [facility] Area of Pain) (Bilateral) (L>R) 09/14/2018  . Chronic knee pain (Fourth Area of Pain) (Bilateral) (R>L) 09/14/2018  . Pharmacologic therapy 09/14/2018  . Disorder of skeletal system 09/14/2018  . Problems influencing health status 09/14/2018  . Vaginal itching 07/12/2018  . Primary osteoarthritis of first carpometacarpal joint of left hand 04/20/2018  . 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 01/16/2018  . PAH (pulmonary artery hypertension) ([place]) 08/03/2017  . Advanced care planning/counseling discussion 06/25/2017  . Synovial cyst of  lumbar facet joint 11/13/2016  . Thoracic aortic atherosclerosis ([place])   . CAD (coronary artery disease) 07/30/2016  . Centrilobular emphysema ([place]) 07/30/2016  . Ex-smoker 07/08/2016  . Nonspecific abnormal electrocardiogram (ECG) (EKG) 07/08/2016  . Chronic lumbar radicular pain (L4) (Bilateral) 06/09/2016  . DOE (dyspnea on exertion) 05/22/2016  . Degenerative joint disease (DJD) of hip 04/17/2016  . Somatic dysfunction of sacroiliac joint 04/17/2016  . S/P Lumbar Fusion (L3-4 PLIF) 04/17/2016  . Cervical fusion syndrome 04/17/2016  . Pedal edema 02/19/2016  . Chronic pain syndrome 02/19/2016  . Abdominal aortic atherosclerosis ([place]) 12/11/2015  . MRSA (methicillin resistant Staphylococcus aureus) infection 10/11/2012  . Urinary urgency 07/28/2012  . Infection or inflammatory reaction due to internal joint prosthesis ([place]) 12/03/2011  . Morbid obesity with BMI of 50.0-59.9, adult ([place]) 05/13/2011  . Essential hypertension 05/17/2010  . HIP PAIN 12/25/2009  . Cervical radiculopathy (Right) 12/25/2009  . Hip pain 12/25/2009  . Benign positional vertigo 07/21/2008  . Hypercholesterolemia 08/16/2007  . Hypothyroidism 07/30/2007  . DM type 2 with diabetic peripheral neuropathy ([place]) 07/30/2007  . ANEMIA-NOS 07/30/2007  . Bipolar disorder ([place]) 07/30/2007  . Allergic rhinitis 07/30/2007  . Asthma 07/30/2007  . GERD 07/30/2007  . Osteoarthritis 07/30/2007  .  OSA on CPAP 07/30/2007     Health Maintenance Due  Topic Date Due  . PNA vac Low Risk Adult (2 of 2 - PPSV23) 01/13/2019  . INFLUENZA VACCINE  04/30/2019     Review of Systems  Physical Exam   Ht '5\' 4"'$  (1.626 m)   LMP  (LMP Unknown)   BMI 63.72 kg/m   Physical Exam  Constitutional:  oriented to person, place, and time. appears well-developed and well-nourished. No distress.  HENT: [place]/AT, PERRLA, no scleral icterus Mouth/Throat: Oropharynx is clear and moist. No oropharyngeal exudate.  Cardiovascular: Normal  rate, regular rhythm and normal heart sounds. Exam reveals no gallop and no friction rub.  No murmur heard.  Pulmonary/Chest: Effort normal and breath sounds normal. No respiratory distress.  has no wheezes.  Neck = supple, no nuchal rigidity Back = surgical incision is c/d/i Neurological: alert and oriented to person, place, and time.  Skin: Skin is warm and dry. No rash noted. No erythema.  Psychiatric: a normal mood and affect.  behavior is normal.   CBC Lab Results  Component Value Date   WBC 9.9 03/24/2019   RBC 3.65 (L) 03/24/2019   HGB 10.6 (L) 03/24/2019   HCT 32.7 (L) 03/24/2019   PLT 204 03/24/2019   MCV 89.6 03/24/2019   MCH 29.0 03/24/2019   MCHC 32.4 03/24/2019   RDW 14.6 03/24/2019   LYMPHSABS 1.3 01/14/2019   MONOABS 0.5 01/14/2019   EOSABS 0.2 01/14/2019    BMET Lab Results  Component Value Date   NA 127 (L) 03/25/2019   K 4.2 03/25/2019   CL 93 (L) 03/25/2019   CO2 24 03/25/2019   GLUCOSE 177 (H) 03/25/2019   BUN 9 03/25/2019   CREATININE 0.71 03/25/2019   CALCIUM 9.0 03/25/2019   GFRNONAA >60 03/25/2019   GFRAA >60 03/25/2019      Assessment and Plan   Currently on 4th week of IV vancomycin for lumbar osteomyelitis with HW involvement. Currently on vancomycin plus rifampin. Currently at 1/2 mark and needs addn 4 kw to complete 8 wk iv course of therapy and then decide to switch over to oral regimen.  Will see back in 4 wk

## 2019-05-09 DIAGNOSIS — B9562 Methicillin resistant Staphylococcus aureus infection as the cause of diseases classified elsewhere: Secondary | ICD-10-CM | POA: Diagnosis not present

## 2019-05-09 DIAGNOSIS — T84216A Breakdown (mechanical) of internal fixation device of vertebrae, initial encounter: Secondary | ICD-10-CM | POA: Diagnosis not present

## 2019-05-09 DIAGNOSIS — M4316 Spondylolisthesis, lumbar region: Secondary | ICD-10-CM | POA: Diagnosis not present

## 2019-05-09 DIAGNOSIS — M5116 Intervertebral disc disorders with radiculopathy, lumbar region: Secondary | ICD-10-CM | POA: Diagnosis not present

## 2019-05-09 DIAGNOSIS — T8463XA Infection and inflammatory reaction due to internal fixation device of spine, initial encounter: Secondary | ICD-10-CM | POA: Diagnosis not present

## 2019-05-09 DIAGNOSIS — Z452 Encounter for adjustment and management of vascular access device: Secondary | ICD-10-CM | POA: Diagnosis not present

## 2019-05-10 DIAGNOSIS — B9562 Methicillin resistant Staphylococcus aureus infection as the cause of diseases classified elsewhere: Secondary | ICD-10-CM | POA: Diagnosis not present

## 2019-05-10 DIAGNOSIS — M4316 Spondylolisthesis, lumbar region: Secondary | ICD-10-CM | POA: Diagnosis not present

## 2019-05-10 DIAGNOSIS — T84216A Breakdown (mechanical) of internal fixation device of vertebrae, initial encounter: Secondary | ICD-10-CM | POA: Diagnosis not present

## 2019-05-10 DIAGNOSIS — Z452 Encounter for adjustment and management of vascular access device: Secondary | ICD-10-CM | POA: Diagnosis not present

## 2019-05-10 DIAGNOSIS — M5116 Intervertebral disc disorders with radiculopathy, lumbar region: Secondary | ICD-10-CM | POA: Diagnosis not present

## 2019-05-10 DIAGNOSIS — S32009K Unspecified fracture of unspecified lumbar vertebra, subsequent encounter for fracture with nonunion: Secondary | ICD-10-CM | POA: Diagnosis not present

## 2019-05-10 DIAGNOSIS — T8463XA Infection and inflammatory reaction due to internal fixation device of spine, initial encounter: Secondary | ICD-10-CM | POA: Diagnosis not present

## 2019-05-12 DIAGNOSIS — M4316 Spondylolisthesis, lumbar region: Secondary | ICD-10-CM | POA: Diagnosis not present

## 2019-05-12 DIAGNOSIS — M5116 Intervertebral disc disorders with radiculopathy, lumbar region: Secondary | ICD-10-CM | POA: Diagnosis not present

## 2019-05-12 DIAGNOSIS — T84216A Breakdown (mechanical) of internal fixation device of vertebrae, initial encounter: Secondary | ICD-10-CM | POA: Diagnosis not present

## 2019-05-12 DIAGNOSIS — B9562 Methicillin resistant Staphylococcus aureus infection as the cause of diseases classified elsewhere: Secondary | ICD-10-CM | POA: Diagnosis not present

## 2019-05-12 DIAGNOSIS — Z452 Encounter for adjustment and management of vascular access device: Secondary | ICD-10-CM | POA: Diagnosis not present

## 2019-05-12 DIAGNOSIS — T8463XA Infection and inflammatory reaction due to internal fixation device of spine, initial encounter: Secondary | ICD-10-CM | POA: Diagnosis not present

## 2019-05-17 ENCOUNTER — Telehealth: Payer: Self-pay

## 2019-05-17 DIAGNOSIS — T84216A Breakdown (mechanical) of internal fixation device of vertebrae, initial encounter: Secondary | ICD-10-CM | POA: Diagnosis not present

## 2019-05-17 DIAGNOSIS — M5116 Intervertebral disc disorders with radiculopathy, lumbar region: Secondary | ICD-10-CM | POA: Diagnosis not present

## 2019-05-17 DIAGNOSIS — T8463XA Infection and inflammatory reaction due to internal fixation device of spine, initial encounter: Secondary | ICD-10-CM | POA: Diagnosis not present

## 2019-05-17 DIAGNOSIS — E119 Type 2 diabetes mellitus without complications: Secondary | ICD-10-CM | POA: Diagnosis not present

## 2019-05-17 DIAGNOSIS — Z452 Encounter for adjustment and management of vascular access device: Secondary | ICD-10-CM | POA: Diagnosis not present

## 2019-05-17 DIAGNOSIS — B9562 Methicillin resistant Staphylococcus aureus infection as the cause of diseases classified elsewhere: Secondary | ICD-10-CM | POA: Diagnosis not present

## 2019-05-17 DIAGNOSIS — M4316 Spondylolisthesis, lumbar region: Secondary | ICD-10-CM | POA: Diagnosis not present

## 2019-05-17 NOTE — Telephone Encounter (Signed)
COVID-19 Pre-Screening Questions:05/17/19   Do you currently have a fever (>100 F), chills or unexplained body aches?NO  Are you currently experiencing new cough, shortness of breath, sore throat, runny nose?NO .  Have you recently travelled outside the state of [place] in the last 14 days? NO .  Have you been in contact with someone that is currently pending confirmation of Covid19 testing or has been confirmed to have the Covid19 virus?  NO  **If the patient answers NO to ALL questions -  advise the patient to please call the clinic before coming to the office should any symptoms develop.     

## 2019-05-18 ENCOUNTER — Ambulatory Visit (INDEPENDENT_AMBULATORY_CARE_PROVIDER_SITE_OTHER): Payer: Medicare Other | Admitting: Internal Medicine

## 2019-05-18 ENCOUNTER — Telehealth: Payer: Self-pay | Admitting: *Deleted

## 2019-05-18 ENCOUNTER — Other Ambulatory Visit: Payer: Self-pay

## 2019-05-18 DIAGNOSIS — M4626 Osteomyelitis of vertebra, lumbar region: Secondary | ICD-10-CM | POA: Diagnosis not present

## 2019-05-18 DIAGNOSIS — R197 Diarrhea, unspecified: Secondary | ICD-10-CM

## 2019-05-18 DIAGNOSIS — T847XXD Infection and inflammatory reaction due to other internal orthopedic prosthetic devices, implants and grafts, subsequent encounter: Secondary | ICD-10-CM

## 2019-05-18 DIAGNOSIS — A4902 Methicillin resistant Staphylococcus aureus infection, unspecified site: Secondary | ICD-10-CM | POA: Diagnosis not present

## 2019-05-18 NOTE — Telephone Encounter (Signed)
C Diff labs per verbal order from Dr Snider. Stool kit at front desk for patient/family to pick up. ,  M, RN  

## 2019-05-18 NOTE — Progress Notes (Signed)
Patient ID: Amy Barnes, female   DOB: December 16, 1950, 68 y.o.   MRN: [id number]  HPI 68yo F with hx of mrsa lumbar osteo with hardware involvement finished 6 wks of vanco plus rif- now on doxycycline. Wound healing well  Outpatient Encounter Medications as of 05/18/2019  Medication Sig  . albuterol (PROVENTIL HFA;VENTOLIN HFA) 108 (90 Base) MCG/ACT inhaler Inhale 2 puffs into the lungs every 6 (six) hours as needed for wheezing or shortness of breath.  Marland Kitchen amLODipine (NORVASC) 5 MG tablet Take 1 tablet (5 mg total) by mouth daily.  Marland Kitchen aspirin 81 MG EC tablet Take 81 mg by mouth at bedtime.   . celecoxib (CELEBREX) 200 MG capsule Take 1 capsule (200 mg total) by mouth daily.  Marland Kitchen doxycycline (VIBRA-TABS) 100 MG tablet TAKE 1 TABLET (100 MG TOTAL) BY MOUTH AT BEDTIME. (Patient not taking: No sig reported)  . fluconazole (DIFLUCAN) 150 MG tablet TAKE 1 TABLET BY MOUTH ONCE FOR YEAST INFECTION REPEAT IN 72 HOURS.AS NEEDED  . FLUoxetine (PROZAC) 40 MG capsule Take by mouth daily.  . Fluticasone-Salmeterol (ADVAIR) 250-50 MCG/DOSE AEPB Inhale 2 puffs into the lungs at bedtime.   . furosemide (LASIX) 40 MG tablet Take 40 mg by mouth daily.   Marland Kitchen lamoTRIgine (LAMICTAL) 200 MG tablet Take 200 mg by mouth daily.    Marland Kitchen levothyroxine (SYNTHROID) 150 MCG tablet Take 1 tablet (150 mcg total) by mouth at bedtime.  Marland Kitchen losartan (COZAAR) 100 MG tablet Take 1 tablet (100 mg total) by mouth daily.  . methocarbamol (ROBAXIN-750) 750 MG tablet Take 1 tablet (750 mg total) by mouth every 6 (six) hours as needed for muscle spasms.  . montelukast (SINGULAIR) 10 MG tablet TAKE 1 TABLET BY MOUTH EVERYDAY AT BEDTIME (Patient taking differently: Take 10 mg by mouth at bedtime. )  . Multiple Vitamin (MULTIVITAMIN WITH MINERALS) TABS tablet Take 2 tablets by mouth daily.   Marland Kitchen nystatin (MYCOSTATIN/NYSTOP) powder Apply topically 4 (four) times daily.  . ondansetron (ZOFRAN) 8 MG tablet Take 8 mg by mouth every 8 (eight) hours as  needed for nausea or vomiting.   . Oxcarbazepine (TRILEPTAL) 300 MG tablet Take 300-600 mg by mouth See admin instructions. Take 300 mg by mouth in the morning and 600 mg at night  . oxybutynin (DITROPAN XL) 10 MG 24 hr tablet Take 1 tablet (10 mg total) by mouth at bedtime. (Patient not taking: Reported on 04/26/2019)  . oxyCODONE 10 MG TABS Take 1 tablet (10 mg total) by mouth every 4 (four) hours as needed for severe pain ((score 7 to 10)).  Marland Kitchen pantoprazole (PROTONIX) 40 MG tablet Take 1 tablet (40 mg total) by mouth daily.  . promethazine (PHENERGAN) 25 MG tablet Take 25 mg by mouth every 6 (six) hours as needed for nausea or vomiting.  . rifampin (RIFADIN) 300 MG capsule TAKE 1 CAPSULE (300 MG TOTAL) BY MOUTH EVERY 12 (TWELVE) HOURS.  Marland Kitchen vancomycin IVPB Inject 2,250 mg into the vein daily. Indication:  MRSA Wound Infection  Last Day of Therapy:  05/19/2019 Labs - Sunday/Monday:  CBC/D, BMP, and vancomycin trough. Labs - Thursday:  BMP and vancomycin trough Labs - Every other week:  ESR and CRP   No facility-administered encounter medications on file as of 05/18/2019.      Patient Active Problem List   Diagnosis Date Noted  . Wound infection complicating hardware ([place]) 04/01/2019  . 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 Bilateral lower extremity edema 11/29/2018  .  Neurogenic urinary incontinence 11/29/2018  . Urinary incontinence 11/29/2018  . Bladder spasms 11/29/2018  . Discogenic low back pain 11/29/2018  . Chronic sacroiliac joint pain (Bilateral) (R>L) 11/29/2018  . Spondylosis without myelopathy or radiculopathy, lumbar region 11/09/2018  . Chronic low back pain (Bilateral) (L>R) w/o sciatica 11/09/2018  . History of hip replacement (Left) 10/13/2018  . History of back surgery 10/13/2018  . Vitamin D insufficiency 10/13/2018  . Osteoarthritis of facet joint of lumbar spine 10/13/2018  . Osteoarthritis involving multiple joints 10/13/2018  .  Cervical radiculopathy (Left) 10/13/2018  . Lumbar facet arthropathy (Bilateral) 10/13/2018  . Lumbar facet syndrome (Bilateral) (L>R) 10/13/2018  . Abnormal MRI, lumbar spine (09/16/2018) 10/13/2018  . Lumbar nerve root compression (L4) (Left) 10/13/2018  . Lumbar foraminal stenosis 10/13/2018  . 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 Chronic low back pain (Primary Area of Pain) (Bilateral) (L>R) w/ sciatica (Left) 09/14/2018  . Chronic lower extremity pain (Secondary Area of Pain) (Left) 09/14/2018  . Chronic hip pain [facility] Area of Pain) (Bilateral) (L>R) 09/14/2018  . Chronic knee pain (Fourth Area of Pain) (Bilateral) (R>L) 09/14/2018  . Pharmacologic therapy 09/14/2018  . Disorder of skeletal system 09/14/2018  . Problems influencing health status 09/14/2018  . Vaginal itching 07/12/2018  . Primary osteoarthritis of first carpometacarpal joint of left hand 04/20/2018  . 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 01/16/2018  . PAH (pulmonary artery hypertension) ([place]) 08/03/2017  . Advanced care planning/counseling discussion 06/25/2017  . Synovial cyst of lumbar facet joint 11/13/2016  . Thoracic aortic atherosclerosis ([place])   . CAD (coronary artery disease) 07/30/2016  . Centrilobular emphysema ([place]) 07/30/2016  . Ex-smoker 07/08/2016  . Nonspecific abnormal electrocardiogram (ECG) (EKG) 07/08/2016  . Chronic lumbar radicular pain (L4) (Bilateral) 06/09/2016  . DOE (dyspnea on exertion) 05/22/2016  . Degenerative joint disease (DJD) of hip 04/17/2016  . Somatic dysfunction of sacroiliac joint 04/17/2016  . S/P Lumbar Fusion (L3-4 PLIF) 04/17/2016  . Cervical fusion syndrome 04/17/2016  . Pedal edema 02/19/2016  . Chronic pain syndrome 02/19/2016  . Abdominal aortic atherosclerosis ([place]) 12/11/2015  . MRSA (methicillin resistant Staphylococcus aureus) infection 10/11/2012  . Urinary urgency 07/28/2012  .  Infection or inflammatory reaction due to internal joint prosthesis ([place]) 12/03/2011  . Morbid obesity with BMI of 50.0-59.9, adult ([place]) 05/13/2011  . Essential hypertension 05/17/2010  . HIP PAIN 12/25/2009  . Cervical radiculopathy (Right) 12/25/2009  . Hip pain 12/25/2009  . Benign positional vertigo 07/21/2008  . Hypercholesterolemia 08/16/2007  . Hypothyroidism 07/30/2007  . DM type 2 with diabetic peripheral neuropathy ([place]) 07/30/2007  . ANEMIA-NOS 07/30/2007  . Bipolar disorder ([place]) 07/30/2007  . Allergic rhinitis 07/30/2007  . Asthma 07/30/2007  . GERD 07/30/2007  . Osteoarthritis 07/30/2007  . OSA on CPAP 07/30/2007     Health Maintenance Due  Topic Date Due  . PNA vac Low Risk Adult (2 of 2 - PPSV23) 01/13/2019  . INFLUENZA VACCINE  04/30/2019     Review of Systems  Physical Exam  LMP  (LMP Unknown)  Physical Exam  Constitutional:  oriented to person, place, and time. appears well-developed and well-nourished. No distress.  HENT: [place]/AT, PERRLA, no scleral icterus Mouth/Throat: Oropharynx is clear and moist. No oropharyngeal exudate.  Back: large incision spine well healed. Skin: Skin is warm and dry. No rash noted. No erythema.  Psychiatric: a normal mood and affect.  behavior is normal.   CBC Lab  Results  Component Value Date   WBC 9.9 03/24/2019   RBC 3.65 (L) 03/24/2019   HGB 10.6 (L) 03/24/2019   HCT 32.7 (L) 03/24/2019   PLT 204 03/24/2019   MCV 89.6 03/24/2019   MCH 29.0 03/24/2019   MCHC 32.4 03/24/2019   RDW 14.6 03/24/2019   LYMPHSABS 1.3 01/14/2019   MONOABS 0.5 01/14/2019   EOSABS 0.2 01/14/2019    BMET Lab Results  Component Value Date   NA 127 (L) 03/25/2019   K 4.2 03/25/2019   CL 93 (L) 03/25/2019   CO2 24 03/25/2019   GLUCOSE 177 (H) 03/25/2019   BUN 9 03/25/2019   CREATININE 0.71 03/25/2019   CALCIUM 9.0 03/25/2019   GFRNONAA >60 03/25/2019   GFRAA >60 03/25/2019    Lab Results  Component Value Date   ESRSEDRATE 21  09/14/2018     Assessment and Plan  Doing well continue on 4 wk of doxycycline and will see back in clinic

## 2019-05-19 ENCOUNTER — Other Ambulatory Visit: Payer: Self-pay

## 2019-05-19 ENCOUNTER — Telehealth: Payer: Self-pay

## 2019-05-19 DIAGNOSIS — T847XXD Infection and inflammatory reaction due to other internal orthopedic prosthetic devices, implants and grafts, subsequent encounter: Secondary | ICD-10-CM

## 2019-05-19 MED ORDER — DOXYCYCLINE HYCLATE 100 MG PO TABS: 100.0000 mg | ORAL_TABLET | Freq: Two times a day (BID) | ORAL | 0 refills | Status: DC

## 2019-05-19 MED ORDER — ONDANSETRON HCL 4 MG PO TABS: 4.0000 mg | ORAL_TABLET | Freq: Two times a day (BID) | ORAL | 3 refills | Status: DC

## 2019-05-19 NOTE — Telephone Encounter (Signed)
Patient's daughter called office today to see if her mother could restart antibiotics. Patient was seen as a e visit on 8/19 complaining of diarrhea. Was advised by MD to hold off on antibiotics until results for c diff returned. Patient states today she has not had any issues with diarrhea, and would like to know if she could restart antibiotics.  Per Dr. Baxter Flattery as long as patient is not having any issues with diarrhea at this time she will be able to restart antibiotics.  Patient's daughter would like to know when her mother's picc line can be removed. States she only has three doses left on Vancomycin, and would like to know if she will have picc line removed after last dose on 8/22. Patient's daughter would also like to know when patient should follow up with our office. Will route message to MD to advise on when picc should be pulled and when she should follow up. [place]

## 2019-05-19 NOTE — Telephone Encounter (Signed)
Per Dr. Baxter Flattery patient can have picc line removed after last dose. She will begin oral Doxycycline 100 mg BID, and have Zofran 4 mg BID in case she gets nauseated. Patient will continue Rifampin unless she gets nauseated, and well then d/c rifampin and inform office.  Spoke with patient's daughter to schedule one month follow up for patient. As well as to inform her on medication. Patient's daughter was able to take all medication information without any questions. Understands if her mother is experiencing nausea/ diarrhea to stop Rifampin and call office.  [facility] is aware to pull patient's picc after last dose.   Medication as prescribed: Doxycyline 100 mg BI, Q: 60 tabs, 0 refills  Zofran 4 mg Bid, Q: 20 tabs, R:3 Aundria Rud, CMA

## 2019-05-20 DIAGNOSIS — T8463XA Infection and inflammatory reaction due to internal fixation device of spine, initial encounter: Secondary | ICD-10-CM | POA: Diagnosis not present

## 2019-05-20 DIAGNOSIS — B9562 Methicillin resistant Staphylococcus aureus infection as the cause of diseases classified elsewhere: Secondary | ICD-10-CM | POA: Diagnosis not present

## 2019-05-20 DIAGNOSIS — M5116 Intervertebral disc disorders with radiculopathy, lumbar region: Secondary | ICD-10-CM | POA: Diagnosis not present

## 2019-05-20 DIAGNOSIS — Z452 Encounter for adjustment and management of vascular access device: Secondary | ICD-10-CM | POA: Diagnosis not present

## 2019-05-20 DIAGNOSIS — T84216A Breakdown (mechanical) of internal fixation device of vertebrae, initial encounter: Secondary | ICD-10-CM | POA: Diagnosis not present

## 2019-05-20 DIAGNOSIS — M4316 Spondylolisthesis, lumbar region: Secondary | ICD-10-CM | POA: Diagnosis not present

## 2019-05-21 DIAGNOSIS — M4316 Spondylolisthesis, lumbar region: Secondary | ICD-10-CM | POA: Diagnosis not present

## 2019-05-21 DIAGNOSIS — B9562 Methicillin resistant Staphylococcus aureus infection as the cause of diseases classified elsewhere: Secondary | ICD-10-CM | POA: Diagnosis not present

## 2019-05-21 DIAGNOSIS — Z452 Encounter for adjustment and management of vascular access device: Secondary | ICD-10-CM | POA: Diagnosis not present

## 2019-05-21 DIAGNOSIS — T84216A Breakdown (mechanical) of internal fixation device of vertebrae, initial encounter: Secondary | ICD-10-CM | POA: Diagnosis not present

## 2019-05-21 DIAGNOSIS — M5116 Intervertebral disc disorders with radiculopathy, lumbar region: Secondary | ICD-10-CM | POA: Diagnosis not present

## 2019-05-21 DIAGNOSIS — T8463XA Infection and inflammatory reaction due to internal fixation device of spine, initial encounter: Secondary | ICD-10-CM | POA: Diagnosis not present

## 2019-05-23 DIAGNOSIS — Z452 Encounter for adjustment and management of vascular access device: Secondary | ICD-10-CM | POA: Diagnosis not present

## 2019-05-23 DIAGNOSIS — B9562 Methicillin resistant Staphylococcus aureus infection as the cause of diseases classified elsewhere: Secondary | ICD-10-CM | POA: Diagnosis not present

## 2019-05-23 DIAGNOSIS — M4316 Spondylolisthesis, lumbar region: Secondary | ICD-10-CM | POA: Diagnosis not present

## 2019-05-23 DIAGNOSIS — T8463XA Infection and inflammatory reaction due to internal fixation device of spine, initial encounter: Secondary | ICD-10-CM | POA: Diagnosis not present

## 2019-05-23 DIAGNOSIS — T84216A Breakdown (mechanical) of internal fixation device of vertebrae, initial encounter: Secondary | ICD-10-CM | POA: Diagnosis not present

## 2019-05-23 DIAGNOSIS — M5116 Intervertebral disc disorders with radiculopathy, lumbar region: Secondary | ICD-10-CM | POA: Diagnosis not present

## 2019-05-24 DIAGNOSIS — B9562 Methicillin resistant Staphylococcus aureus infection as the cause of diseases classified elsewhere: Secondary | ICD-10-CM | POA: Diagnosis not present

## 2019-05-24 DIAGNOSIS — M5116 Intervertebral disc disorders with radiculopathy, lumbar region: Secondary | ICD-10-CM | POA: Diagnosis not present

## 2019-05-24 DIAGNOSIS — T8463XA Infection and inflammatory reaction due to internal fixation device of spine, initial encounter: Secondary | ICD-10-CM | POA: Diagnosis not present

## 2019-05-24 DIAGNOSIS — Z452 Encounter for adjustment and management of vascular access device: Secondary | ICD-10-CM | POA: Diagnosis not present

## 2019-05-24 DIAGNOSIS — T84216A Breakdown (mechanical) of internal fixation device of vertebrae, initial encounter: Secondary | ICD-10-CM | POA: Diagnosis not present

## 2019-05-24 DIAGNOSIS — M4316 Spondylolisthesis, lumbar region: Secondary | ICD-10-CM | POA: Diagnosis not present

## 2019-05-25 DIAGNOSIS — D649 Anemia, unspecified: Secondary | ICD-10-CM | POA: Diagnosis not present

## 2019-05-25 DIAGNOSIS — M4316 Spondylolisthesis, lumbar region: Secondary | ICD-10-CM | POA: Diagnosis not present

## 2019-05-25 DIAGNOSIS — E785 Hyperlipidemia, unspecified: Secondary | ICD-10-CM | POA: Diagnosis not present

## 2019-05-25 DIAGNOSIS — M5116 Intervertebral disc disorders with radiculopathy, lumbar region: Secondary | ICD-10-CM | POA: Diagnosis not present

## 2019-05-25 DIAGNOSIS — I2510 Atherosclerotic heart disease of native coronary artery without angina pectoris: Secondary | ICD-10-CM | POA: Diagnosis not present

## 2019-05-25 DIAGNOSIS — Z792 Long term (current) use of antibiotics: Secondary | ICD-10-CM | POA: Diagnosis not present

## 2019-05-25 DIAGNOSIS — F319 Bipolar disorder, unspecified: Secondary | ICD-10-CM | POA: Diagnosis not present

## 2019-05-25 DIAGNOSIS — Z79891 Long term (current) use of opiate analgesic: Secondary | ICD-10-CM | POA: Diagnosis not present

## 2019-05-25 DIAGNOSIS — B9562 Methicillin resistant Staphylococcus aureus infection as the cause of diseases classified elsewhere: Secondary | ICD-10-CM | POA: Diagnosis not present

## 2019-05-25 DIAGNOSIS — J432 Centrilobular emphysema: Secondary | ICD-10-CM | POA: Diagnosis not present

## 2019-05-25 DIAGNOSIS — Z6841 Body Mass Index (BMI) 40.0 and over, adult: Secondary | ICD-10-CM | POA: Diagnosis not present

## 2019-05-25 DIAGNOSIS — T8463XA Infection and inflammatory reaction due to internal fixation device of spine, initial encounter: Secondary | ICD-10-CM | POA: Diagnosis not present

## 2019-05-25 DIAGNOSIS — E119 Type 2 diabetes mellitus without complications: Secondary | ICD-10-CM | POA: Diagnosis not present

## 2019-05-25 DIAGNOSIS — K219 Gastro-esophageal reflux disease without esophagitis: Secondary | ICD-10-CM | POA: Diagnosis not present

## 2019-05-25 DIAGNOSIS — I10 Essential (primary) hypertension: Secondary | ICD-10-CM | POA: Diagnosis not present

## 2019-05-25 DIAGNOSIS — I700 Atherosclerosis of aorta: Secondary | ICD-10-CM | POA: Diagnosis not present

## 2019-05-25 DIAGNOSIS — M48062 Spinal stenosis, lumbar region with neurogenic claudication: Secondary | ICD-10-CM | POA: Diagnosis not present

## 2019-05-25 DIAGNOSIS — T84216A Breakdown (mechanical) of internal fixation device of vertebrae, initial encounter: Secondary | ICD-10-CM | POA: Diagnosis not present

## 2019-05-25 DIAGNOSIS — G4733 Obstructive sleep apnea (adult) (pediatric): Secondary | ICD-10-CM | POA: Diagnosis not present

## 2019-05-25 DIAGNOSIS — F419 Anxiety disorder, unspecified: Secondary | ICD-10-CM | POA: Diagnosis not present

## 2019-05-25 DIAGNOSIS — G2581 Restless legs syndrome: Secondary | ICD-10-CM | POA: Diagnosis not present

## 2019-05-25 DIAGNOSIS — Z8701 Personal history of pneumonia (recurrent): Secondary | ICD-10-CM | POA: Diagnosis not present

## 2019-05-25 DIAGNOSIS — M170 Bilateral primary osteoarthritis of knee: Secondary | ICD-10-CM | POA: Diagnosis not present

## 2019-05-25 DIAGNOSIS — Z7982 Long term (current) use of aspirin: Secondary | ICD-10-CM | POA: Diagnosis not present

## 2019-05-26 DIAGNOSIS — F3181 Bipolar II disorder: Secondary | ICD-10-CM | POA: Diagnosis not present

## 2019-06-02 DIAGNOSIS — T84216A Breakdown (mechanical) of internal fixation device of vertebrae, initial encounter: Secondary | ICD-10-CM | POA: Diagnosis not present

## 2019-06-02 DIAGNOSIS — M5116 Intervertebral disc disorders with radiculopathy, lumbar region: Secondary | ICD-10-CM | POA: Diagnosis not present

## 2019-06-02 DIAGNOSIS — M48062 Spinal stenosis, lumbar region with neurogenic claudication: Secondary | ICD-10-CM | POA: Diagnosis not present

## 2019-06-02 DIAGNOSIS — M4316 Spondylolisthesis, lumbar region: Secondary | ICD-10-CM | POA: Diagnosis not present

## 2019-06-02 DIAGNOSIS — T8463XA Infection and inflammatory reaction due to internal fixation device of spine, initial encounter: Secondary | ICD-10-CM | POA: Diagnosis not present

## 2019-06-02 DIAGNOSIS — B9562 Methicillin resistant Staphylococcus aureus infection as the cause of diseases classified elsewhere: Secondary | ICD-10-CM | POA: Diagnosis not present

## 2019-06-06 DIAGNOSIS — T847XXD Infection and inflammatory reaction due to other internal orthopedic prosthetic devices, implants and grafts, subsequent encounter: Secondary | ICD-10-CM

## 2019-06-07 ENCOUNTER — Telehealth: Payer: Self-pay

## 2019-06-07 DIAGNOSIS — T847XXD Infection and inflammatory reaction due to other internal orthopedic prosthetic devices, implants and grafts, subsequent encounter: Secondary | ICD-10-CM

## 2019-06-07 MED ORDER — RIFAMPIN 300 MG PO CAPS: 300.0000 mg | ORAL_CAPSULE | Freq: Two times a day (BID) | ORAL | 0 refills | Status: DC

## 2019-06-07 MED ORDER — DOXYCYCLINE HYCLATE 100 MG PO TABS: 100.0000 mg | ORAL_TABLET | Freq: Two times a day (BID) | ORAL | 0 refills | Status: DC

## 2019-06-07 NOTE — Addendum Note (Signed)
Addended by: Eugenia Mcalpine on: 06/07/2019 04:35 PM   Modules accepted: Orders

## 2019-06-08 DIAGNOSIS — M4316 Spondylolisthesis, lumbar region: Secondary | ICD-10-CM | POA: Diagnosis not present

## 2019-06-08 DIAGNOSIS — T8463XA Infection and inflammatory reaction due to internal fixation device of spine, initial encounter: Secondary | ICD-10-CM | POA: Diagnosis not present

## 2019-06-08 DIAGNOSIS — M5116 Intervertebral disc disorders with radiculopathy, lumbar region: Secondary | ICD-10-CM | POA: Diagnosis not present

## 2019-06-08 DIAGNOSIS — B9562 Methicillin resistant Staphylococcus aureus infection as the cause of diseases classified elsewhere: Secondary | ICD-10-CM | POA: Diagnosis not present

## 2019-06-08 DIAGNOSIS — M48062 Spinal stenosis, lumbar region with neurogenic claudication: Secondary | ICD-10-CM | POA: Diagnosis not present

## 2019-06-08 DIAGNOSIS — T84216A Breakdown (mechanical) of internal fixation device of vertebrae, initial encounter: Secondary | ICD-10-CM | POA: Diagnosis not present

## 2019-06-09 ENCOUNTER — Encounter: Payer: Self-pay | Admitting: Family Medicine

## 2019-06-13 NOTE — Telephone Encounter (Signed)
Faxed rx

## 2019-06-13 NOTE — Telephone Encounter (Signed)
Rx in Lisa's box. plz fax over.

## 2019-06-14 ENCOUNTER — Other Ambulatory Visit: Payer: Self-pay | Admitting: Family Medicine

## 2019-06-20 ENCOUNTER — Encounter: Payer: Self-pay | Admitting: Internal Medicine

## 2019-06-20 ENCOUNTER — Ambulatory Visit (INDEPENDENT_AMBULATORY_CARE_PROVIDER_SITE_OTHER): Payer: Medicare Other | Admitting: Internal Medicine

## 2019-06-20 ENCOUNTER — Other Ambulatory Visit: Payer: Self-pay

## 2019-06-20 VITALS — BP 121/76 | HR 80 | Temp 97.6°F

## 2019-06-20 DIAGNOSIS — A4902 Methicillin resistant Staphylococcus aureus infection, unspecified site: Secondary | ICD-10-CM

## 2019-06-20 DIAGNOSIS — R110 Nausea: Secondary | ICD-10-CM

## 2019-06-20 DIAGNOSIS — T847XXD Infection and inflammatory reaction due to other internal orthopedic prosthetic devices, implants and grafts, subsequent encounter: Secondary | ICD-10-CM

## 2019-06-20 MED ORDER — DOXYCYCLINE HYCLATE 100 MG PO TABS: 100.0000 mg | ORAL_TABLET | Freq: Two times a day (BID) | ORAL | 3 refills | Status: AC

## 2019-06-20 NOTE — Progress Notes (Signed)
Patient ID: Amy Barnes, female   DOB: [birth year], 68 y.o.   MRN: [id number]  HPI 68 y.o. female with hx of L3-L4 decompression fusion 8 and 2 years ago who had failed medical management for recurrent back/leg pain. She had ongoing lumbar pseudoarthrosis and L4-L5 spondylolisthesis on imaging to explain lumbar radiculopathy and neurogenic claudication. She underwent decompression PLIF and exploration of lumbar fusion/removal of old lumbar hardware. Possbily loosening of hw noted. Cultures sent from OR which showed MRSA. ID consulted in late June for MRSA lumbar HW infection. Has been anti MRSA treatment for roughly 3 months. --on doxy for the past 4 wk and continues on rifampin  She still feels nauseated with medication   Outpatient Encounter Medications as of 06/20/2019  Medication Sig  . albuterol (PROVENTIL HFA;VENTOLIN HFA) 108 (90 Base) MCG/ACT inhaler Inhale 2 puffs into the lungs every 6 (six) hours as needed for wheezing or shortness of breath.  Marland Kitchen amLODipine (NORVASC) 5 MG tablet Take 1 tablet (5 mg total) by mouth daily.  Marland Kitchen aspirin 81 MG EC tablet Take 81 mg by mouth at bedtime.   . celecoxib (CELEBREX) 200 MG capsule TAKE ONE CAPSULE BY MOUTH DAILY  . doxycycline (VIBRA-TABS) 100 MG tablet Take 1 tablet (100 mg total) by mouth 2 (two) times daily.  . fluconazole (DIFLUCAN) 150 MG tablet TAKE 1 TABLET BY MOUTH ONCE FOR YEAST INFECTION REPEAT IN 72 HOURS.AS NEEDED  . FLUoxetine (PROZAC) 40 MG capsule Take by mouth daily.  . Fluticasone-Salmeterol (ADVAIR) 250-50 MCG/DOSE AEPB Inhale 2 puffs into the lungs at bedtime.   . furosemide (LASIX) 40 MG tablet Take 40 mg by mouth daily.   Marland Kitchen lamoTRIgine (LAMICTAL) 200 MG tablet Take 200 mg by mouth daily.    Marland Kitchen levothyroxine (SYNTHROID) 150 MCG tablet Take 1 tablet (150 mcg total) by mouth at bedtime.  Marland Kitchen losartan (COZAAR) 100 MG tablet Take 1 tablet (100 mg total) by mouth daily.  . methocarbamol (ROBAXIN-750) 750 MG tablet Take 1 tablet  (750 mg total) by mouth every 6 (six) hours as needed for muscle spasms.  . montelukast (SINGULAIR) 10 MG tablet TAKE 1 TABLET BY MOUTH EVERYDAY AT BEDTIME (Patient taking differently: Take 10 mg by mouth at bedtime. )  . Multiple Vitamin (MULTIVITAMIN WITH MINERALS) TABS tablet Take 2 tablets by mouth daily.   Marland Kitchen nystatin (MYCOSTATIN/NYSTOP) powder Apply topically 4 (four) times daily.  . ondansetron (ZOFRAN) 4 MG tablet Take 1 tablet (4 mg total) by mouth 2 (two) times daily.  . Oxcarbazepine (TRILEPTAL) 300 MG tablet Take 300-600 mg by mouth See admin instructions. Take 300 mg by mouth in the morning and 600 mg at night  . oxyCODONE 10 MG TABS Take 1 tablet (10 mg total) by mouth every 4 (four) hours as needed for severe pain ((score 7 to 10)).  Marland Kitchen pantoprazole (PROTONIX) 40 MG tablet Take 1 tablet (40 mg total) by mouth daily.  . promethazine (PHENERGAN) 25 MG tablet Take 25 mg by mouth every 6 (six) hours as needed for nausea or vomiting.  . rifampin (RIFADIN) 300 MG capsule Take 1 capsule (300 mg total) by mouth 2 (two) times daily.  . ondansetron (ZOFRAN) 8 MG tablet Take 8 mg by mouth every 8 (eight) hours as needed for nausea or vomiting.   Marland Kitchen oxybutynin (DITROPAN XL) 10 MG 24 hr tablet Take 1 tablet (10 mg total) by mouth at bedtime. (Patient not taking: Reported on 04/26/2019)   No facility-administered encounter medications on  file as of 06/20/2019.      Patient Active Problem List   Diagnosis Date Noted  . Wound infection complicating hardware ([place]) 04/01/2019  . 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 Bilateral lower extremity edema 11/29/2018  . Neurogenic urinary incontinence 11/29/2018  . Urinary incontinence 11/29/2018  . Bladder spasms 11/29/2018  . Discogenic low back pain 11/29/2018  . Chronic sacroiliac joint pain (Bilateral) (R>L) 11/29/2018  . Spondylosis without myelopathy or radiculopathy, lumbar region 11/09/2018  . Chronic low back pain  (Bilateral) (L>R) w/o sciatica 11/09/2018  . History of hip replacement (Left) 10/13/2018  . History of back surgery 10/13/2018  . Vitamin D insufficiency 10/13/2018  . Osteoarthritis of facet joint of lumbar spine 10/13/2018  . Osteoarthritis involving multiple joints 10/13/2018  . Cervical radiculopathy (Left) 10/13/2018  . Lumbar facet arthropathy (Bilateral) 10/13/2018  . Lumbar facet syndrome (Bilateral) (L>R) 10/13/2018  . Abnormal MRI, lumbar spine (09/16/2018) 10/13/2018  . Lumbar nerve root compression (L4) (Left) 10/13/2018  . Lumbar foraminal stenosis 10/13/2018  . 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 Chronic low back pain (Primary Area of Pain) (Bilateral) (L>R) w/ sciatica (Left) 09/14/2018  . Chronic lower extremity pain (Secondary Area of Pain) (Left) 09/14/2018  . Chronic hip pain [facility] Area of Pain) (Bilateral) (L>R) 09/14/2018  . Chronic knee pain (Fourth Area of Pain) (Bilateral) (R>L) 09/14/2018  . Pharmacologic therapy 09/14/2018  . Disorder of skeletal system 09/14/2018  . Problems influencing health status 09/14/2018  . Vaginal itching 07/12/2018  . Primary osteoarthritis of first carpometacarpal joint of left hand 04/20/2018  . 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 01/16/2018  . PAH (pulmonary artery hypertension) ([place]) 08/03/2017  . Advanced care planning/counseling discussion 06/25/2017  . Synovial cyst of lumbar facet joint 11/13/2016  . Thoracic aortic atherosclerosis ([place])   . CAD (coronary artery disease) 07/30/2016  . Centrilobular emphysema ([place]) 07/30/2016  . Ex-smoker 07/08/2016  . Nonspecific abnormal electrocardiogram (ECG) (EKG) 07/08/2016  . Chronic lumbar radicular pain (L4) (Bilateral) 06/09/2016  . DOE (dyspnea on exertion) 05/22/2016  . Degenerative joint disease (DJD) of hip 04/17/2016  . Somatic dysfunction of sacroiliac joint 04/17/2016  . S/P Lumbar Fusion  (L3-4 PLIF) 04/17/2016  . Cervical fusion syndrome 04/17/2016  . Pedal edema 02/19/2016  . Chronic pain syndrome 02/19/2016  . Abdominal aortic atherosclerosis ([place]) 12/11/2015  . MRSA (methicillin resistant Staphylococcus aureus) infection 10/11/2012  . Urinary urgency 07/28/2012  . Infection or inflammatory reaction due to internal joint prosthesis ([place]) 12/03/2011  . Morbid obesity with BMI of 50.0-59.9, adult ([place]) 05/13/2011  . Essential hypertension 05/17/2010  . HIP PAIN 12/25/2009  . Cervical radiculopathy (Right) 12/25/2009  . Hip pain 12/25/2009  . Benign positional vertigo 07/21/2008  . Hypercholesterolemia 08/16/2007  . Hypothyroidism 07/30/2007  . DM type 2 with diabetic peripheral neuropathy ([place]) 07/30/2007  . ANEMIA-NOS 07/30/2007  . Bipolar disorder ([place]) 07/30/2007  . Allergic rhinitis 07/30/2007  . Asthma 07/30/2007  . GERD 07/30/2007  . Osteoarthritis 07/30/2007  . OSA on CPAP 07/30/2007     Health Maintenance Due  Topic Date Due  . PNA vac Low Risk Adult (2 of 2 - PPSV23) 01/13/2019  . INFLUENZA VACCINE  04/30/2019    Social History   Tobacco Use  . Smoking status: Former Smoker    Packs/day: 1.50    Years: 40.00    Pack years: 60.00    Types: Cigarettes  Quit date: 04/29/2009    Years since quitting: 10.1  . Smokeless tobacco: Never Used  Substance Use Topics  . Alcohol use: Yes    Alcohol/week: 21.0 standard drinks    Types: 21 Shots of liquor per week    Comment: 3 shots/per night  . Drug use: No   Review of Systems Nausea, mild back pain, no fever,chills, nightsweats, 12 point ros is otherwise negative Physical Exam  BP 121/76   Pulse 80   Temp 97.6 F (36.4 C) (Oral)   LMP  (LMP Unknown)  Physical Exam  Constitutional:  oriented to person, place, and time. appears well-developed and well-nourished. No distress.  HENT: [place]/AT, PERRLA, no scleral icterus Mouth/Throat: Oropharynx is clear and moist. No oropharyngeal exudate.   Cardiovascular: Normal rate, regular rhythm and normal heart sounds. Exam reveals no gallop and no friction rub.  No murmur heard.  Pulmonary/Chest: Effort normal and breath sounds normal. No respiratory distress.  has no wheezes.  Back= incision well healed, no surrounding erythema, no drainage   CBC Lab Results  Component Value Date   WBC 9.9 03/24/2019   RBC 3.65 (L) 03/24/2019   HGB 10.6 (L) 03/24/2019   HCT 32.7 (L) 03/24/2019   PLT 204 03/24/2019   MCV 89.6 03/24/2019   MCH 29.0 03/24/2019   MCHC 32.4 03/24/2019   RDW 14.6 03/24/2019   LYMPHSABS 1.3 01/14/2019   MONOABS 0.5 01/14/2019   EOSABS 0.2 01/14/2019    BMET Lab Results  Component Value Date   NA 127 (L) 03/25/2019   K 4.2 03/25/2019   CL 93 (L) 03/25/2019   CO2 24 03/25/2019   GLUCOSE 177 (H) 03/25/2019   BUN 9 03/25/2019   CREATININE 0.71 03/25/2019   CALCIUM 9.0 03/25/2019   GFRNONAA >60 03/25/2019   GFRAA >60 03/25/2019    Lab Results  Component Value Date   ESRSEDRATE 22 06/20/2019   Lab Results  Component Value Date   CRP 14.2 (H) 06/20/2019     Assessment and Plan  MRSA HW infection = Will have her finish rif (roughly 3 more weeks of meds) then do monotherapy with doxy.   Will check labs today/ addendum = still will need to continue with taking chronic abtx since inflammatory markers still elevated  Nausea = likely from rifampin or doxycycline. Will ask her to premedicate with anti-emetic if needed. Take abtx on full stomach  Health maintenance = Refused flu vac  rtc in 6 wk

## 2019-06-21 DIAGNOSIS — T84216A Breakdown (mechanical) of internal fixation device of vertebrae, initial encounter: Secondary | ICD-10-CM | POA: Diagnosis not present

## 2019-06-21 DIAGNOSIS — M5116 Intervertebral disc disorders with radiculopathy, lumbar region: Secondary | ICD-10-CM | POA: Diagnosis not present

## 2019-06-21 DIAGNOSIS — B9562 Methicillin resistant Staphylococcus aureus infection as the cause of diseases classified elsewhere: Secondary | ICD-10-CM | POA: Diagnosis not present

## 2019-06-21 DIAGNOSIS — M48062 Spinal stenosis, lumbar region with neurogenic claudication: Secondary | ICD-10-CM | POA: Diagnosis not present

## 2019-06-21 DIAGNOSIS — T8463XA Infection and inflammatory reaction due to internal fixation device of spine, initial encounter: Secondary | ICD-10-CM | POA: Diagnosis not present

## 2019-06-21 DIAGNOSIS — M4316 Spondylolisthesis, lumbar region: Secondary | ICD-10-CM | POA: Diagnosis not present

## 2019-06-21 LAB — CBC WITH DIFFERENTIAL/PLATELET
Absolute Monocytes: 504 cells/uL (ref 200–950)
Basophils Absolute: 28 cells/uL (ref 0–200)
Basophils Relative: 0.5 %
Eosinophils Absolute: 162 cells/uL (ref 15–500)
Eosinophils Relative: 2.9 %
HCT: 37.8 % (ref 35.0–45.0)
Hemoglobin: 12.4 g/dL (ref 11.7–15.5)
Lymphs Abs: 1618 cells/uL (ref 850–3900)
MCH: 28.3 pg (ref 27.0–33.0)
MCHC: 32.8 g/dL (ref 32.0–36.0)
MCV: 86.3 fL (ref 80.0–100.0)
MPV: 10.7 fL (ref 7.5–12.5)
Monocytes Relative: 9 %
Neutro Abs: 3287 cells/uL (ref 1500–7800)
Neutrophils Relative %: 58.7 %
Platelets: 247 10*3/uL (ref 140–400)
RBC: 4.38 10*6/uL (ref 3.80–5.10)
RDW: 13.6 % (ref 11.0–15.0)
Total Lymphocyte: 28.9 %
WBC: 5.6 10*3/uL (ref 3.8–10.8)

## 2019-06-21 LAB — BASIC METABOLIC PANEL
BUN: 23 mg/dL (ref 7–25)
CO2: 26 mmol/L (ref 20–32)
Calcium: 9.6 mg/dL (ref 8.6–10.4)
Chloride: 102 mmol/L (ref 98–110)
Creat: 0.67 mg/dL (ref 0.50–0.99)
Glucose, Bld: 157 mg/dL — ABNORMAL HIGH (ref 65–99)
Potassium: 4.5 mmol/L (ref 3.5–5.3)
Sodium: 139 mmol/L (ref 135–146)

## 2019-06-21 LAB — C-REACTIVE PROTEIN: CRP: 14.2 mg/L — ABNORMAL HIGH (ref <8.0)

## 2019-06-21 LAB — SEDIMENTATION RATE: Sed Rate: 22 mm/h (ref 0–30)

## 2019-06-22 ENCOUNTER — Ambulatory Visit (INDEPENDENT_AMBULATORY_CARE_PROVIDER_SITE_OTHER): Payer: Medicare Other | Admitting: Urology

## 2019-06-22 ENCOUNTER — Encounter: Payer: Self-pay | Admitting: Urology

## 2019-06-22 ENCOUNTER — Other Ambulatory Visit: Payer: Self-pay

## 2019-06-22 VITALS — BP 143/76 | HR 84 | Ht 64.0 in

## 2019-06-22 DIAGNOSIS — N3941 Urge incontinence: Secondary | ICD-10-CM

## 2019-06-22 DIAGNOSIS — R3915 Urgency of urination: Secondary | ICD-10-CM | POA: Diagnosis not present

## 2019-06-22 MED ORDER — FESOTERODINE FUMARATE ER 8 MG PO TB24: 8.0000 mg | ORAL_TABLET | Freq: Every day | ORAL | 11 refills | Status: DC

## 2019-06-22 NOTE — Progress Notes (Signed)
06/22/2019 1:02 PM   Amy Barnes 1950-11-07 [id number]  Referring provider: [name], MD [address] [place],  [place] [address]  Chief Complaint  Patient presents with  . Follow-up    HPI:  F/u incontinence. She feels like she is having a "bladder spasm".  She will get a sudden urge to void and then when she stands she will lose her whole bladder.  Sometimes she does not feel like she has a good stream or is empty.  She has not had any dysuria or gross hematuria.  She started oxybutynin 5 mg XL and has not seen a lot of change.  Her symptoms were worse after a back injection a few months ago.  Cath specimen today obtained 125 cc, she has not voided in a couple of hours.  Her pad is dry.  She is drinking plenty of water.  She drinks three 32 ounce bottles a day.  She has no bulge symptoms.  3 normal spontaneous vaginal deliveries.  She has neurogenic and nonneurogenic factors that might affect her voiding.  Her morbid obesity affects her mobility.  She also has significant lumbar degenerative disc disease and compression at L4.  Neurosurgery may be planning back surgery and general surgery may be planning bypass.  She did undergo CT scan of the abdomen and pelvis in 2017 and I reviewed those images.  They were benign and her bladder was not distended.  Her recent creatinine was 0.66.   Pelvic exam normal 02/2019. We increased the oxybutynin to 10 mg XL. She had lower back surgery and still having a lot of pain. She has sudden urgency and UUI. No dry mouth or constipation. She's had LE swelling. A friend told her about " tolterodine 8 mg".    PMH: Past Medical History:  Diagnosis Date  . Allergic rhinitis   . Ambulates with cane    straight  . Anemia   . Anxiety   . Asthma    seasonal  . Bipolar affective disorder (HCC)    takes Synthroid meds for Bipolar  . CAD (coronary artery disease) 07/2016   by CT scan  . Carpal tunnel syndrome    had surgery but  occasional still has some issues per patient  . Cataract   . Centrilobular emphysema (HCC) 07/2016   by CT scan - pt not aware of this  . Constipation due to pain medication   . Depression with anxiety   . Diabetes mellitus    type 2 - no meds diet controlled  . GERD (gastroesophageal reflux disease)   . History of blood transfusion   . 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 Hyperlipidemia   . Hypertension   . OSA (obstructive sleep apnea)    no longer using cpap, uses a bed that raises and lowers hob  . Osteoarthritis   . Osteoarthritis   . Pneumonia   . Restless legs   . Septic arthritis (HCC) 10/11/2012  . Shingles 06/30/2016  . Shortness of breath    with exertion  . Status post revision of total hip replacement bilateral   prosthetic infection R 2013, L 2015  . SVD (spontaneous vaginal delivery)    x 3  . Thoracic aortic atherosclerosis (HCC) 11/207   by CT    Surgical History: Past Surgical History:  Procedure Laterality Date  . CARPAL TUNNEL RELEASE Right 05/15/2011  . CARPAL TUNNEL RELEASE Left 12/24/2017  Procedure: LEFT CARPAL TUNNEL RELEASE;  Surgeon: Betha LoaKuzma, Kevin, MD;  Location: [facility];  Service: Orthopedics;  Laterality: Left;  . CERVICAL FUSION  2012   C2/3/4  . COLONOSCOPY WITH PROPOFOL N/A 12/31/2015   diverticulosis, int hem, o/w normal rpt 10 yrs (Rein)  . EYE SURGERY Bilateral    cataract surgery with lens implant  . FOOT SURGERY  1982   bone spur  . I&D EXTREMITY  09/06/2012   Budd PalmerMichael H Handy, MD; Right;  I&D of right thigh  . I&D EXTREMITY Left 07/2013   wound vac - daily doxycycline indefinitely  . KNEE ARTHROSCOPY  09/01/2012   Dannielle HuhSteve Lucey, MD;  Right  . LUMBAR FUSION  01/08/2012   L3-4  . LUMBAR FUSION  02/2019   unexpectedly discovered MRSA infection - pus Lovell Sheehan(Jenkins)   . LUMBAR LAMINECTOMY/DECOMPRESSION MICRODISCECTOMY N/A 11/13/2016   LAMINOTOMY/LAMINECTOMY LUMBAR FOUR LUMBAR FIVE  WITH  RESECTION OF SYNOVIAL CYST;  Surgeon: Tressie StalkerJeffrey Jenkins, MD  . NECK SURGERY     Herniated disk C2,3,4  . NOSE SURGERY    . PARTIAL HYSTERECTOMY  1984   for mennorhagia, ovaries remain  . REVISION TOTAL HIP ARTHROPLASTY Left 08/28/2011  . TMJ ARTHROPLASTY  1982  . TOTAL HIP ARTHROPLASTY Left 2002  . TRIGGER FINGER RELEASE Right 05/15/2011   long finger    Home Medications:  Allergies as of 06/22/2019      Reactions   Cephalexin Hives   Hydrocodone Other (See Comments)   Reaction:  Hallucinations    Risperidone And Related Other (See Comments)   Reaction:  Made pt excessively sleepy   Seroquel [quetiapine Fumarate] Other (See Comments)   Reaction:  Made pt excessively sleepy   Sulfa Antibiotics Rash      Medication List       Accurate as of June 22, 2019  1:02 PM. If you have any questions, ask your nurse or doctor.        albuterol 108 (90 Base) MCG/ACT inhaler Commonly known as: VENTOLIN HFA Inhale 2 puffs into the lungs every 6 (six) hours as needed for wheezing or shortness of breath.   amLODipine 5 MG tablet Commonly known as: NORVASC Take 1 tablet (5 mg total) by mouth daily.   aspirin 81 MG EC tablet Take 81 mg by mouth at bedtime.   celecoxib 200 MG capsule Commonly known as: CELEBREX TAKE ONE CAPSULE BY MOUTH DAILY   cyclobenzaprine 10 MG tablet Commonly known as: FLEXERIL TAKE 1 TABLET BY MOUTH 3 TIMES EVERY DAY AS NEEDED FOR MUSCLE SPASMS   doxycycline 100 MG tablet Commonly known as: VIBRA-TABS Take 1 tablet (100 mg total) by mouth 2 (two) times daily.   fluconazole 150 MG tablet Commonly known as: DIFLUCAN TAKE 1 TABLET BY MOUTH ONCE FOR YEAST INFECTION REPEAT IN 72 HOURS.AS NEEDED   FLUoxetine 40 MG capsule Commonly known as: PROZAC Take by mouth daily.   Fluticasone-Salmeterol 250-50 MCG/DOSE Aepb Commonly known as: ADVAIR Inhale 2 puffs into the lungs at bedtime.   furosemide 40 MG tablet Commonly known as: LASIX Take 40 mg by  mouth daily.   lamoTRIgine 200 MG tablet Commonly known as: LAMICTAL Take 200 mg by mouth daily.   levothyroxine 150 MCG tablet Commonly known as: SYNTHROID Take 1 tablet (150 mcg total) by mouth at bedtime.   losartan 100 MG tablet Commonly known as: COZAAR Take 1 tablet (100 mg total) by mouth daily.   methocarbamol 750 MG tablet Commonly known as: Robaxin-750 Take  1 tablet (750 mg total) by mouth every 6 (six) hours as needed for muscle spasms.   montelukast 10 MG tablet Commonly known as: SINGULAIR TAKE 1 TABLET BY MOUTH EVERYDAY AT BEDTIME What changed: See the new instructions.   multivitamin with minerals Tabs tablet Take 2 tablets by mouth daily.   nystatin powder Commonly known as: MYCOSTATIN/NYSTOP Apply topically 4 (four) times daily.   ondansetron 4 MG tablet Commonly known as: Zofran Take 1 tablet (4 mg total) by mouth 2 (two) times daily.   ondansetron 8 MG tablet Commonly known as: ZOFRAN Take 8 mg by mouth every 8 (eight) hours as needed for nausea or vomiting.   Oxcarbazepine 300 MG tablet Commonly known as: TRILEPTAL Take 300-600 mg by mouth See admin instructions. Take 300 mg by mouth in the morning and 600 mg at night   oxybutynin 10 MG 24 hr tablet Commonly known as: Ditropan XL Take 1 tablet (10 mg total) by mouth at bedtime.   Oxycodone HCl 10 MG Tabs Take 1 tablet (10 mg total) by mouth every 4 (four) hours as needed for severe pain ((score 7 to 10)).   pantoprazole 40 MG tablet Commonly known as: PROTONIX Take 1 tablet (40 mg total) by mouth daily.   promethazine 25 MG tablet Commonly known as: PHENERGAN Take 25 mg by mouth every 6 (six) hours as needed for nausea or vomiting.   rifampin 300 MG capsule Commonly known as: Rifadin Take 1 capsule (300 mg total) by mouth 2 (two) times daily.   traMADol 50 MG tablet Commonly known as: ULTRAM Take 50-100 mg by mouth every 6 (six) hours as needed.   vancomycin 10 G Solr injection  Commonly known as: VANCOCIN       Allergies:  Allergies  Allergen Reactions  . Cephalexin Hives  . Hydrocodone Other (See Comments)    Reaction:  Hallucinations   . Risperidone And Related Other (See Comments)    Reaction:  Made pt excessively sleepy  . Seroquel [Quetiapine Fumarate] Other (See Comments)    Reaction:  Made pt excessively sleepy  . Sulfa Antibiotics Rash    Family History: Family History  Problem Relation Age of Onset  . Aneurysm Father 41       brain  . Alcohol abuse Father   . Cancer Father        possibly  . CAD Other        several siblings  . Cancer Brother        prostate  . Diabetes Brother   . Diabetes Sister   . Anesthesia problems Neg Hx   . Hypotension Neg Hx   . Malignant hyperthermia Neg Hx   . Pseudochol deficiency Neg Hx     Social History:  reports that she quit smoking about 10 years ago. Her smoking use included cigarettes. She has a 60.00 pack-year smoking history. She has never used smokeless tobacco. She reports current alcohol use of about 21.0 standard drinks of alcohol per week. She reports that she does not use drugs.  ROS: UROLOGY Frequent Urination?: Yes Hard to postpone urination?: No Burning/pain with urination?: Yes Get up at night to urinate?: Yes Leakage of urine?: Yes Urine stream starts and stops?: No Trouble starting stream?: No Do you have to strain to urinate?: No Blood in urine?: No Urinary tract infection?: No Sexually transmitted disease?: No Injury to kidneys or bladder?: No Painful intercourse?: No Weak stream?: No Currently pregnant?: No Vaginal bleeding?: No Last menstrual period?:  N/A  Gastrointestinal Nausea?: Yes Vomiting?: No Indigestion/heartburn?: No Diarrhea?: No Constipation?: No  Constitutional Fever: No Night sweats?: Yes Weight loss?: No Fatigue?: Yes  Skin Skin rash/lesions?: No Itching?: No  Eyes Blurred vision?: No Double vision?: No  Ears/Nose/Throat Sore  throat?: No Sinus problems?: Yes  Hematologic/Lymphatic Swollen glands?: No Easy bruising?: No  Cardiovascular Leg swelling?: Yes Chest pain?: No  Respiratory Cough?: No Shortness of breath?: Yes  Endocrine Excessive thirst?: Yes  Musculoskeletal Back pain?: Yes Joint pain?: Yes  Neurological Headaches?: No Dizziness?: No  Psychologic Depression?: Yes Anxiety?: Yes  Physical Exam: BP (!) 143/76 (BP Location: Left Wrist, Patient Position: Sitting, Cuff Size: Large)   Pulse 84   Ht 5\' 4"  (1.626 m)   LMP  (LMP Unknown)   BMI 63.72 kg/m   Constitutional:  Alert and oriented, No acute distress. HEENT: [place] AT, moist mucus membranes.  Trachea midline, no masses. Cardiovascular: No clubbing, cyanosis, or edema. Respiratory: Normal respiratory effort, no increased work of breathing. GI: Abdomen is soft, nontender, nondistended, no abdominal masses, obese  GU: No CVA tenderness Skin: No rashes, bruises or suspicious lesions. Neurologic: Grossly intact, no focal deficits, moving all 4 extremities. Psychiatric: Normal mood and affect.  Laboratory Data: Lab Results  Component Value Date   WBC 5.6 06/20/2019   HGB 12.4 06/20/2019   HCT 37.8 06/20/2019   MCV 86.3 06/20/2019   PLT 247 06/20/2019    Lab Results  Component Value Date   CREATININE 0.67 06/20/2019    No results found for: PSA  No results found for: TESTOSTERONE  Lab Results  Component Value Date   HGBA1C 6.5 (H) 03/24/2019    Urinalysis    Component Value Date/Time   COLORURINE YELLOW (A) 11/27/2015 0224   APPEARANCEUR Clear 03/04/2019 0948   LABSPEC 1.045 (H) 11/27/2015 0224   LABSPEC 1.017 09/25/2012 2123   PHURINE 5.0 11/27/2015 0224   GLUCOSEU Negative 03/04/2019 0948   GLUCOSEU Negative 09/25/2012 2123   HGBUR NEGATIVE 11/27/2015 0224   HGBUR negative 05/17/2010 0946   BILIRUBINUR Negative 03/04/2019 0948   BILIRUBINUR Negative 09/25/2012 2123   KETONESUR NEGATIVE 11/27/2015 0224    PROTEINUR Negative 03/04/2019 0948   PROTEINUR 30 (A) 11/27/2015 0224   UROBILINOGEN 0.2 01/14/2019 0934   UROBILINOGEN 1.0 08/30/2012 1511   NITRITE Negative 03/04/2019 0948   NITRITE NEGATIVE 11/27/2015 0224   LEUKOCYTESUR Trace (A) 03/04/2019 0948   LEUKOCYTESUR Negative 09/25/2012 2123    Lab Results  Component Value Date   LABMICR See below: 03/04/2019   WBCUA 11-30 (A) 03/04/2019   LABEPIT 0-10 03/04/2019   BACTERIA None seen 03/04/2019    Pertinent Imaging: n/a No results found for this or any previous visit. No results found for this or any previous visit. No results found for this or any previous visit. No results found for this or any previous visit. No results found for this or any previous visit. No results found for this or any previous visit. No results found for this or any previous visit. No results found for this or any previous visit.  Assessment & Plan:    OAB meds -  I sent a rx for Toviaz 8 mg or we can try tolterodine 2 mg BID or go up on oxybutynin to 15 mg XL.   No follow-ups on file.  Jerilee Field, MD  [facility] [address] [place], [place] [address] [phone]

## 2019-06-23 ENCOUNTER — Other Ambulatory Visit: Payer: Medicare Other

## 2019-07-02 ENCOUNTER — Other Ambulatory Visit: Payer: Self-pay | Admitting: Internal Medicine

## 2019-07-22 DIAGNOSIS — M5136 Other intervertebral disc degeneration, lumbar region: Secondary | ICD-10-CM | POA: Diagnosis not present

## 2019-07-22 DIAGNOSIS — S32009K Unspecified fracture of unspecified lumbar vertebra, subsequent encounter for fracture with nonunion: Secondary | ICD-10-CM | POA: Diagnosis not present

## 2019-07-22 DIAGNOSIS — R030 Elevated blood-pressure reading, without diagnosis of hypertension: Secondary | ICD-10-CM | POA: Diagnosis not present

## 2019-07-27 DIAGNOSIS — F3181 Bipolar II disorder: Secondary | ICD-10-CM | POA: Diagnosis not present

## 2019-08-04 ENCOUNTER — Ambulatory Visit (INDEPENDENT_AMBULATORY_CARE_PROVIDER_SITE_OTHER): Payer: Medicare Other | Admitting: Internal Medicine

## 2019-08-04 ENCOUNTER — Encounter: Payer: Self-pay | Admitting: Internal Medicine

## 2019-08-04 ENCOUNTER — Other Ambulatory Visit: Payer: Self-pay

## 2019-08-04 VITALS — BP 120/75 | HR 90 | Temp 97.8°F

## 2019-08-04 DIAGNOSIS — T847XXD Infection and inflammatory reaction due to other internal orthopedic prosthetic devices, implants and grafts, subsequent encounter: Secondary | ICD-10-CM | POA: Diagnosis not present

## 2019-08-04 DIAGNOSIS — B3731 Acute candidiasis of vulva and vagina: Secondary | ICD-10-CM

## 2019-08-04 DIAGNOSIS — B373 Candidiasis of vulva and vagina: Secondary | ICD-10-CM

## 2019-08-04 DIAGNOSIS — A4902 Methicillin resistant Staphylococcus aureus infection, unspecified site: Secondary | ICD-10-CM | POA: Diagnosis not present

## 2019-08-04 MED ORDER — FLUCONAZOLE 200 MG PO TABS: 400.0000 mg | ORAL_TABLET | Freq: Every day | ORAL | 3 refills | Status: AC

## 2019-08-04 NOTE — Progress Notes (Signed)
RFV: follow for MRSA lumbar HW infection   Patient ID: Amy Barnes, female   DOB: 07/04/1951, 68 y.o.   MRN: [id number]  HPI 68 y.o.femalewith hx of L3-L4 decompression fusion who had ongoing lumbar pseudoarthrosis and L4-L5 spondylolisthesis on imaging to explain lumbar radiculopathy and neurogenic claudication. She underwent decompression PLIF and exploration of lumbar fusion/removal of old lumbar hardware on 03/23/19 and OR cx found MRSA. She was treated with vancomycin plus rifampin then after 8 wk switched to doxycycline and rifampin - but recently unable to tolerate rifampin and remains on doxycycline monotherapy.  Has had ongoing candidal skin infection, using nystatin with some relief  Micro:  Methicillin resistant staphylococcus aureus    MIC    CIPROFLOXACIN >=8 RESISTANT  Resistant    CLINDAMYCIN >=8 RESISTANT  Resistant    ERYTHROMYCIN >=8 RESISTANT  Resistant    GENTAMICIN <=0.5 SENSI... Sensitive    Inducible Clindamycin NEGATIVE  Sensitive    OXACILLIN >=4 RESISTANT  Resistant    RIFAMPIN <=0.5 SENSI... Sensitive    TETRACYCLINE 2 SENSITIVE  Sensitive    TRIMETH/SULFA <=10 SENSIT... Sensitive    VANCOMYCIN <=0.5 SENSI... Sensitive      Outpatient Encounter Medications as of 08/04/2019  Medication Sig  . vancomycin (VANCOCIN) 10 G SOLR injection   . albuterol (PROVENTIL HFA;VENTOLIN HFA) 108 (90 Base) MCG/ACT inhaler Inhale 2 puffs into the lungs every 6 (six) hours as needed for wheezing or shortness of breath.  Marland Kitchen amLODipine (NORVASC) 5 MG tablet Take 1 tablet (5 mg total) by mouth daily.  Marland Kitchen aspirin 81 MG EC tablet Take 81 mg by mouth at bedtime.   . celecoxib (CELEBREX) 200 MG capsule TAKE ONE CAPSULE BY MOUTH DAILY  . cyclobenzaprine (FLEXERIL) 10 MG tablet TAKE 1 TABLET BY MOUTH 3 TIMES EVERY DAY AS NEEDED FOR MUSCLE SPASMS  . fesoterodine (TOVIAZ) 8 MG TB24 tablet Take 1 tablet (8 mg total) by mouth daily.  . fluconazole (DIFLUCAN) 150 MG tablet TAKE 1 TABLET  BY MOUTH ONCE FOR YEAST INFECTION REPEAT IN 72 HOURS.AS NEEDED  . FLUoxetine (PROZAC) 40 MG capsule Take by mouth daily.  . Fluticasone-Salmeterol (ADVAIR) 250-50 MCG/DOSE AEPB Inhale 2 puffs into the lungs at bedtime.   . furosemide (LASIX) 40 MG tablet Take 40 mg by mouth daily.   Marland Kitchen lamoTRIgine (LAMICTAL) 200 MG tablet Take 200 mg by mouth daily.    Marland Kitchen levothyroxine (SYNTHROID) 150 MCG tablet Take 1 tablet (150 mcg total) by mouth at bedtime.  Marland Kitchen losartan (COZAAR) 100 MG tablet Take 1 tablet (100 mg total) by mouth daily.  . methocarbamol (ROBAXIN-750) 750 MG tablet Take 1 tablet (750 mg total) by mouth every 6 (six) hours as needed for muscle spasms.  . montelukast (SINGULAIR) 10 MG tablet TAKE 1 TABLET BY MOUTH EVERYDAY AT BEDTIME (Patient taking differently: Take 10 mg by mouth at bedtime. )  . Multiple Vitamin (MULTIVITAMIN WITH MINERALS) TABS tablet Take 2 tablets by mouth daily.   Marland Kitchen nystatin (MYCOSTATIN/NYSTOP) powder Apply topically 4 (four) times daily.  . ondansetron (ZOFRAN) 4 MG tablet Take 1 tablet (4 mg total) by mouth 2 (two) times daily.  . ondansetron (ZOFRAN) 8 MG tablet Take 8 mg by mouth every 8 (eight) hours as needed for nausea or vomiting.   . Oxcarbazepine (TRILEPTAL) 300 MG tablet Take 300-600 mg by mouth See admin instructions. Take 300 mg by mouth in the morning and 600 mg at night  . oxybutynin (DITROPAN XL) 10 MG 24 hr tablet  Take 1 tablet (10 mg total) by mouth at bedtime.  Marland Kitchen oxyCODONE 10 MG TABS Take 1 tablet (10 mg total) by mouth every 4 (four) hours as needed for severe pain ((score 7 to 10)).  Marland Kitchen pantoprazole (PROTONIX) 40 MG tablet Take 1 tablet (40 mg total) by mouth daily.  . promethazine (PHENERGAN) 25 MG tablet Take 25 mg by mouth every 6 (six) hours as needed for nausea or vomiting.  . rifampin (RIFADIN) 300 MG capsule TAKE 1 CAPSULE BY MOUTH TWICE A DAY  . traMADol (ULTRAM) 50 MG tablet Take 50-100 mg by mouth every 6 (six) hours as needed.   No  facility-administered encounter medications on file as of 08/04/2019.      Patient Active Problem List   Diagnosis Date Noted  . Wound infection complicating hardware (HCC) 04/01/2019  . 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 Bilateral lower extremity edema 11/29/2018  . Neurogenic urinary incontinence 11/29/2018  . Urinary incontinence 11/29/2018  . Bladder spasms 11/29/2018  . Discogenic low back pain 11/29/2018  . Chronic sacroiliac joint pain (Bilateral) (R>L) 11/29/2018  . Spondylosis without myelopathy or radiculopathy, lumbar region 11/09/2018  . Chronic low back pain (Bilateral) (L>R) w/o sciatica 11/09/2018  . History of hip replacement (Left) 10/13/2018  . History of back surgery 10/13/2018  . Vitamin D insufficiency 10/13/2018  . Osteoarthritis of facet joint of lumbar spine 10/13/2018  . Osteoarthritis involving multiple joints 10/13/2018  . Cervical radiculopathy (Left) 10/13/2018  . Lumbar facet arthropathy (Bilateral) 10/13/2018  . Lumbar facet syndrome (Bilateral) (L>R) 10/13/2018  . Abnormal MRI, lumbar spine (09/16/2018) 10/13/2018  . Lumbar nerve root compression (L4) (Left) 10/13/2018  . Lumbar foraminal stenosis 10/13/2018  . 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 Chronic low back pain (Primary Area of Pain) (Bilateral) (L>R) w/ sciatica (Left) 09/14/2018  . Chronic lower extremity pain (Secondary Area of Pain) (Left) 09/14/2018  . Chronic hip pain [facility] Area of Pain) (Bilateral) (L>R) 09/14/2018  . Chronic knee pain (Fourth Area of Pain) (Bilateral) (R>L) 09/14/2018  . Pharmacologic therapy 09/14/2018  . Disorder of skeletal system 09/14/2018  . Problems influencing health status 09/14/2018  . Vaginal itching 07/12/2018  . Primary osteoarthritis of first carpometacarpal joint of left hand 04/20/2018  . 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 01/16/2018  .  PAH (pulmonary artery hypertension) (HCC) 08/03/2017  . Advanced care planning/counseling discussion 06/25/2017  . Synovial cyst of lumbar facet joint 11/13/2016  . Thoracic aortic atherosclerosis (HCC)   . CAD (coronary artery disease) 07/30/2016  . Centrilobular emphysema (HCC) 07/30/2016  . Ex-smoker 07/08/2016  . Nonspecific abnormal electrocardiogram (ECG) (EKG) 07/08/2016  . Chronic lumbar radicular pain (L4) (Bilateral) 06/09/2016  . DOE (dyspnea on exertion) 05/22/2016  . Degenerative joint disease (DJD) of hip 04/17/2016  . Somatic dysfunction of sacroiliac joint 04/17/2016  . S/P Lumbar Fusion (L3-4 PLIF) 04/17/2016  . Cervical fusion syndrome 04/17/2016  . Pedal edema 02/19/2016  . Chronic pain syndrome 02/19/2016  . Abdominal aortic atherosclerosis (HCC) 12/11/2015  . MRSA (methicillin resistant Staphylococcus aureus) infection 10/11/2012  . Urinary urgency 07/28/2012  . Infection or inflammatory reaction due to internal joint prosthesis (HCC) 12/03/2011  . Morbid obesity with BMI of 50.0-59.9, adult (HCC) 05/13/2011  . Essential hypertension 05/17/2010  . HIP PAIN 12/25/2009  . Cervical radiculopathy (Right) 12/25/2009  . Hip pain 12/25/2009  . Benign positional vertigo 07/21/2008  . Hypercholesterolemia 08/16/2007  .  Hypothyroidism 07/30/2007  . DM type 2 with diabetic peripheral neuropathy (HCC) 07/30/2007  . ANEMIA-NOS 07/30/2007  . Bipolar disorder (HCC) 07/30/2007  . Allergic rhinitis 07/30/2007  . Asthma 07/30/2007  . GERD 07/30/2007  . Osteoarthritis 07/30/2007  . OSA on CPAP 07/30/2007     Health Maintenance Due  Topic Date Due  . PNA vac Low Risk Adult (2 of 2 - PPSV23) 01/13/2019  . INFLUENZA VACCINE  04/30/2019  . FOOT EXAM  07/13/2019     Review of Systems  Physical Exam   BP 120/75   Pulse 90   Temp 97.8 F (36.6 C)   LMP  (LMP Unknown)   Physical Exam  Constitutional:  oriented to person, place, and time. appears well-developed and  well-nourished. No distress.  HENT: [place]/AT, PERRLA, no scleral icterus Mouth/Throat: Oropharynx is clear and moist. No oropharyngeal exudate.  Back= incision well healed, deep crevasse  Skin: Skin is warm and dry. No rash noted. No erythema.  Psychiatric: a normal mood and affect.  behavior is normal.   CBC Lab Results  Component Value Date   WBC 5.6 06/20/2019   RBC 4.38 06/20/2019   HGB 12.4 06/20/2019   HCT 37.8 06/20/2019   PLT 247 06/20/2019   MCV 86.3 06/20/2019   MCH 28.3 06/20/2019   MCHC 32.8 06/20/2019   RDW 13.6 06/20/2019   LYMPHSABS 1,618 06/20/2019   MONOABS 0.5 01/14/2019   EOSABS 162 06/20/2019    BMET Lab Results  Component Value Date   NA 139 06/20/2019   K 4.5 06/20/2019   CL 102 06/20/2019   CO2 26 06/20/2019   GLUCOSE 157 (H) 06/20/2019   BUN 23 06/20/2019   CREATININE 0.67 06/20/2019   CALCIUM 9.6 06/20/2019   GFRNONAA >60 03/25/2019   GFRAA >60 03/25/2019    Lab Results  Component Value Date   ESRSEDRATE 25 08/04/2019   Lab Results  Component Value Date   CRP 11.5 (H) 08/04/2019     Assessment and Plan  Lumbar infection = continue with doxycycline. Will check sed rate and crp  Vaginal candidiasis = will give Fluconazole 200mg  daily x 7d to use PRN course

## 2019-08-05 ENCOUNTER — Telehealth: Payer: Self-pay | Admitting: *Deleted

## 2019-08-05 DIAGNOSIS — Z122 Encounter for screening for malignant neoplasm of respiratory organs: Secondary | ICD-10-CM

## 2019-08-05 DIAGNOSIS — Z87891 Personal history of nicotine dependence: Secondary | ICD-10-CM

## 2019-08-05 LAB — CBC WITH DIFFERENTIAL/PLATELET
Absolute Monocytes: 626 cells/uL (ref 200–950)
Basophils Absolute: 31 cells/uL (ref 0–200)
Basophils Relative: 0.5 %
Eosinophils Absolute: 242 cells/uL (ref 15–500)
Eosinophils Relative: 3.9 %
HCT: 37.6 % (ref 35.0–45.0)
Hemoglobin: 12.2 g/dL (ref 11.7–15.5)
Lymphs Abs: 1965 cells/uL (ref 850–3900)
MCH: 28.2 pg (ref 27.0–33.0)
MCHC: 32.4 g/dL (ref 32.0–36.0)
MCV: 87 fL (ref 80.0–100.0)
MPV: 10.2 fL (ref 7.5–12.5)
Monocytes Relative: 10.1 %
Neutro Abs: 3336 cells/uL (ref 1500–7800)
Neutrophils Relative %: 53.8 %
Platelets: 229 10*3/uL (ref 140–400)
RBC: 4.32 10*6/uL (ref 3.80–5.10)
RDW: 13.2 % (ref 11.0–15.0)
Total Lymphocyte: 31.7 %
WBC: 6.2 10*3/uL (ref 3.8–10.8)

## 2019-08-05 LAB — COMPLETE METABOLIC PANEL WITH GFR
AG Ratio: 1.6 (calc) (ref 1.0–2.5)
ALT: 26 U/L (ref 6–29)
AST: 21 U/L (ref 10–35)
Albumin: 4.3 g/dL (ref 3.6–5.1)
Alkaline phosphatase (APISO): 98 U/L (ref 37–153)
BUN: 21 mg/dL (ref 7–25)
CO2: 28 mmol/L (ref 20–32)
Calcium: 9.7 mg/dL (ref 8.6–10.4)
Chloride: 103 mmol/L (ref 98–110)
Creat: 0.97 mg/dL (ref 0.50–0.99)
GFR, Est African American: 70 mL/min/{1.73_m2} (ref >=60)
GFR, Est Non African American: 60 mL/min/{1.73_m2} (ref >=60)
Globulin: 2.7 g/dL (calc) (ref 1.9–3.7)
Glucose, Bld: 186 mg/dL — ABNORMAL HIGH (ref 65–99)
Potassium: 4.4 mmol/L (ref 3.5–5.3)
Sodium: 140 mmol/L (ref 135–146)
Total Bilirubin: 0.4 mg/dL (ref 0.2–1.2)
Total Protein: 7 g/dL (ref 6.1–8.1)

## 2019-08-05 LAB — SEDIMENTATION RATE: Sed Rate: 25 mm/h (ref 0–30)

## 2019-08-05 LAB — C-REACTIVE PROTEIN: CRP: 11.5 mg/L — ABNORMAL HIGH (ref <8.0)

## 2019-08-05 NOTE — Telephone Encounter (Signed)
Patient has been notified that annual lung cancer screening low dose CT scan is due currently or will be in near future. Confirmed that patient is within the age range of 55-77, and asymptomatic, (no signs or symptoms of lung cancer). Patient denies illness that would prevent curative treatment for lung cancer if found. Verified smoking history, (former, quit 2010, 45 pack year). The shared decision making visit was done 07/15/16. Patient is agreeable for CT scan being scheduled. Date TBD do to scheduling request.

## 2019-08-07 ENCOUNTER — Other Ambulatory Visit: Payer: Self-pay | Admitting: Family Medicine

## 2019-08-07 DIAGNOSIS — E1142 Type 2 diabetes mellitus with diabetic polyneuropathy: Secondary | ICD-10-CM

## 2019-08-07 DIAGNOSIS — E7800 Pure hypercholesterolemia, unspecified: Secondary | ICD-10-CM

## 2019-08-08 ENCOUNTER — Other Ambulatory Visit: Payer: Self-pay

## 2019-08-08 ENCOUNTER — Other Ambulatory Visit (INDEPENDENT_AMBULATORY_CARE_PROVIDER_SITE_OTHER): Payer: Medicare Other

## 2019-08-08 DIAGNOSIS — E7800 Pure hypercholesterolemia, unspecified: Secondary | ICD-10-CM | POA: Diagnosis not present

## 2019-08-08 DIAGNOSIS — E1142 Type 2 diabetes mellitus with diabetic polyneuropathy: Secondary | ICD-10-CM

## 2019-08-08 LAB — LIPID PANEL
Cholesterol: 157 mg/dL (ref 0–200)
HDL: 61.2 mg/dL (ref >39.00)
LDL Cholesterol: 75 mg/dL (ref 0–99)
NonHDL: 95.94
Total CHOL/HDL Ratio: 3
Triglycerides: 103 mg/dL (ref 0.0–149.0)
VLDL: 20.6 mg/dL (ref 0.0–40.0)

## 2019-08-08 LAB — HEMOGLOBIN A1C: Hgb A1c MFr Bld: 7.3 % — ABNORMAL HIGH (ref 4.6–6.5)

## 2019-08-09 ENCOUNTER — Ambulatory Visit
Admission: RE | Admit: 2019-08-09 | Discharge: 2019-08-09 | Disposition: A | Discharge status: Home / Self Care | Payer: Medicare Other | Source: Ambulatory Visit | Attending: Family Medicine | Admitting: Family Medicine

## 2019-08-09 DIAGNOSIS — M85851 Other specified disorders of bone density and structure, right thigh: Secondary | ICD-10-CM | POA: Insufficient documentation

## 2019-08-09 DIAGNOSIS — Z981 Arthrodesis status: Secondary | ICD-10-CM | POA: Diagnosis not present

## 2019-08-09 DIAGNOSIS — Z1239 Encounter for other screening for malignant neoplasm of breast: Secondary | ICD-10-CM

## 2019-08-09 DIAGNOSIS — E2839 Other primary ovarian failure: Secondary | ICD-10-CM | POA: Diagnosis not present

## 2019-08-09 DIAGNOSIS — Z1231 Encounter for screening mammogram for malignant neoplasm of breast: Secondary | ICD-10-CM | POA: Diagnosis not present

## 2019-08-10 LAB — HM MAMMOGRAPHY

## 2019-08-11 ENCOUNTER — Encounter: Payer: Self-pay | Admitting: Family Medicine

## 2019-08-11 ENCOUNTER — Ambulatory Visit (INDEPENDENT_AMBULATORY_CARE_PROVIDER_SITE_OTHER): Payer: Medicare Other | Admitting: Family Medicine

## 2019-08-11 ENCOUNTER — Other Ambulatory Visit: Payer: Self-pay

## 2019-08-11 VITALS — BP 134/80 | HR 81 | Temp 97.7°F | Ht 64.0 in

## 2019-08-11 DIAGNOSIS — I10 Essential (primary) hypertension: Secondary | ICD-10-CM

## 2019-08-11 DIAGNOSIS — M8580 Other specified disorders of bone density and structure, unspecified site: Secondary | ICD-10-CM | POA: Insufficient documentation

## 2019-08-11 DIAGNOSIS — M961 Postlaminectomy syndrome, not elsewhere classified: Secondary | ICD-10-CM

## 2019-08-11 DIAGNOSIS — E1142 Type 2 diabetes mellitus with diabetic polyneuropathy: Secondary | ICD-10-CM

## 2019-08-11 DIAGNOSIS — E785 Hyperlipidemia, unspecified: Secondary | ICD-10-CM

## 2019-08-11 DIAGNOSIS — T847XXD Infection and inflammatory reaction due to other internal orthopedic prosthetic devices, implants and grafts, subsequent encounter: Secondary | ICD-10-CM

## 2019-08-11 DIAGNOSIS — M85851 Other specified disorders of bone density and structure, right thigh: Secondary | ICD-10-CM

## 2019-08-11 DIAGNOSIS — I700 Atherosclerosis of aorta: Secondary | ICD-10-CM

## 2019-08-11 DIAGNOSIS — E1169 Type 2 diabetes mellitus with other specified complication: Secondary | ICD-10-CM

## 2019-08-11 DIAGNOSIS — Z6841 Body Mass Index (BMI) 40.0 and over, adult: Secondary | ICD-10-CM | POA: Diagnosis not present

## 2019-08-11 DIAGNOSIS — R0989 Other specified symptoms and signs involving the circulatory and respiratory systems: Secondary | ICD-10-CM | POA: Diagnosis not present

## 2019-08-11 HISTORY — DX: Other specified disorders of bone density and structure, unspecified site: M85.80

## 2019-08-11 MED ORDER — VITAMIN D3 25 MCG (1000 UT) PO CAPS: 1.0000 | ORAL_CAPSULE | Freq: Every day | ORAL | Status: DC

## 2019-08-11 NOTE — Assessment & Plan Note (Signed)
Chronic, stable. Continue current regimen. 

## 2019-08-11 NOTE — Assessment & Plan Note (Signed)
Simvastatin was stopped during latest hospitalization 03/2019. FLP stable off statin. Regardless discussed statin indication in diabetic, she desires to hold off on restarting at this time - will reassess statin commencement at f/u visit.

## 2019-08-11 NOTE — Assessment & Plan Note (Signed)
Continues aspirin. See below Re statin.

## 2019-08-11 NOTE — Assessment & Plan Note (Signed)
Reviewed latest DEXA scan showing osteopenia. Encouraged good calcium and vit D intake, start 1000 IU vit D daily. Reviewed calcium in diet. Weight bearing exercise limited by back issues.

## 2019-08-11 NOTE — Assessment & Plan Note (Signed)
MRSA lumbar infection with hardware involvement after latest surgery, completed 6 wks IV PICC, continues oral doxycycline - followed by ID appreciate their care.

## 2019-08-11 NOTE — Patient Instructions (Addendum)
Start monitoring sugars and diabetic diet more closely.  Check with insurance on preferred sugar meter brand and let me know.  Schedule eye exam at your convenience.  Consider cholesterol medicine in diabetes to help decrease cardiovascular risk.  Mammogram returned normal. Bone density scan showed osteopenia - ensure good calcium in diet and start vitamin D 1000 units daily.  We will refer you for arterial circulation evaluation of legs due to decreased pulse on left side.

## 2019-08-11 NOTE — Progress Notes (Signed)
This visit was conducted in person.  BP 134/80 (BP Location: Left Arm, Patient Position: Sitting) Comment (Cuff Size): Thigh cuff   Pulse 81    Temp 97.7 F (36.5 C) (Temporal)    Ht 5\' 4"  (1.626 m)    LMP  (LMP Unknown)    SpO2 95%    BMI 63.72 kg/m    CC: 4 mo f/u visit Subjective:    Patient ID: Amy Barnes, female    DOB: 08/10/1951, 68 y.o.   MRN: [id number]  HPI: Amy Barnes is a 68 y.o. female presenting on 08/11/2019 for Follow-up (Here for 3-4 mo f/u.  Pt accompanied granddaughter, [place]Victoria (temp, 97.7). 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 Currently only on doxycycline twice daily. Last saw ID last week, treated for yeast infection with diflucan 1 wk course. Limited ambulation - now only in wheelchair.   HTN - doing well on current regimen of losartan, amlodipine, lasix PRN.   DM - does not regularly check sugars. Compliant with antihyperglycemic regimen which includes: diet controlled. Denies low sugars or hypoglycemic symptoms. Denies paresthesias. Last diabetic eye exam 2 yrs ago - will call to schedule appt. Pneumovax: 2013. Prevnar: 2019. Glucometer brand: [facility] brand. DSME: 02/2019. Lab Results  Component Value Date   HGBA1C 7.3 (H) 08/08/2019   Diabetic Foot Exam - Simple   Simple Foot Form Diabetic Foot exam was performed with the following findings: Yes 08/11/2019 11:15 AM  Visual Inspection No deformities, no ulcerations, no other skin breakdown bilaterally: Yes Sensation Testing Intact to touch and monofilament testing bilaterally: Yes Pulse Check See comments: Yes Comments Diminished pulses of left foot     Lab Results  Component Value Date   MICROALBUR 0.3 08/19/2010        Relevant past medical, surgical, family and social history reviewed and updated as indicated. Interim medical history since our last visit reviewed. Allergies and medications reviewed and  updated. Outpatient Medications Prior to Visit  Medication Sig Dispense Refill   albuterol (PROVENTIL HFA;VENTOLIN HFA) 108 (90 Base) MCG/ACT inhaler Inhale 2 puffs into the lungs every 6 (six) hours as needed for wheezing or shortness of breath. 1 Inhaler 6   amLODipine (NORVASC) 5 MG tablet Take 1 tablet (5 mg total) by mouth daily. 30 tablet 6   aspirin 81 MG EC tablet Take 81 mg by mouth at bedtime.      celecoxib (CELEBREX) 200 MG capsule TAKE ONE CAPSULE BY MOUTH DAILY 90 capsule 1   cyclobenzaprine (FLEXERIL) 10 MG tablet TAKE 1 TABLET BY MOUTH 3 TIMES EVERY DAY AS NEEDED FOR MUSCLE SPASMS     doxycycline (VIBRA-TABS) 100 MG tablet Take 100 mg by mouth 2 (two) times daily.     fesoterodine (TOVIAZ) 8 MG TB24 tablet Take 1 tablet (8 mg total) by mouth daily. 30 tablet 11   fluconazole (DIFLUCAN) 200 MG tablet Take 2 tablets (400 mg total) by mouth daily for 7 days. 14 tablet 3   FLUoxetine (PROZAC) 40 MG capsule Take by mouth daily.     Fluticasone-Salmeterol (ADVAIR) 250-50 MCG/DOSE AEPB Inhale 2 puffs into the lungs at bedtime.      furosemide (LASIX) 40 MG tablet Take 40 mg by mouth daily.      lamoTRIgine (LAMICTAL) 200 MG tablet Take 200 mg by mouth daily.       levothyroxine (SYNTHROID) 150 MCG tablet Take 1 tablet (150 mcg total)  by mouth at bedtime. 90 tablet 3   losartan (COZAAR) 100 MG tablet Take 1 tablet (100 mg total) by mouth daily. 90 tablet 1   methocarbamol (ROBAXIN-750) 750 MG tablet Take 1 tablet (750 mg total) by mouth every 6 (six) hours as needed for muscle spasms. 28 tablet 0   montelukast (SINGULAIR) 10 MG tablet TAKE 1 TABLET BY MOUTH EVERYDAY AT BEDTIME (Patient taking differently: Take 10 mg by mouth at bedtime. ) 90 tablet 2   Multiple Vitamin (MULTIVITAMIN WITH MINERALS) TABS tablet Take 2 tablets by mouth daily.      nystatin (MYCOSTATIN/NYSTOP) powder Apply topically 4 (four) times daily. 60 g 3   ondansetron (ZOFRAN) 4 MG tablet Take 1  tablet (4 mg total) by mouth 2 (two) times daily. 20 tablet 3   ondansetron (ZOFRAN) 8 MG tablet Take 8 mg by mouth every 8 (eight) hours as needed for nausea or vomiting.      Oxcarbazepine (TRILEPTAL) 300 MG tablet Take 300-600 mg by mouth See admin instructions. Take 300 mg by mouth in the morning and 600 mg at night     oxybutynin (DITROPAN XL) 10 MG 24 hr tablet Take 1 tablet (10 mg total) by mouth at bedtime. 30 tablet 11   oxyCODONE 10 MG TABS Take 1 tablet (10 mg total) by mouth every 4 (four) hours as needed for severe pain ((score 7 to 10)). 42 tablet 0   pantoprazole (PROTONIX) 40 MG tablet Take 1 tablet (40 mg total) by mouth daily. 90 tablet 3   promethazine (PHENERGAN) 25 MG tablet Take 25 mg by mouth every 6 (six) hours as needed for nausea or vomiting.     traMADol (ULTRAM) 50 MG tablet Take 50-100 mg by mouth every 6 (six) hours as needed.     rifampin (RIFADIN) 300 MG capsule TAKE 1 CAPSULE BY MOUTH TWICE A DAY 60 capsule 3   vancomycin (VANCOCIN) 10 G SOLR injection      No facility-administered medications prior to visit.      Per HPI unless specifically indicated in ROS section below Review of Systems Objective:    BP 134/80 (BP Location: Left Arm, Patient Position: Sitting) Comment (Cuff Size): Thigh cuff   Pulse 81    Temp 97.7 F (36.5 C) (Temporal)    Ht 5\' 4"  (1.626 m)    LMP  (LMP Unknown)    SpO2 95%    BMI 63.72 kg/m   Wt Readings from Last 3 Encounters:  03/23/19 (!) 371 lb 3.2 oz (168.4 kg)  03/18/19 (!) 371 lb 3.2 oz (168.4 kg)  03/04/19 (!) 362 lb (164.2 kg)    Physical Exam Vitals signs and nursing note reviewed.  Constitutional:      General: She is not in acute distress.    Appearance: Normal appearance. She is well-developed. She is obese. She is not ill-appearing.     Comments: Sitting in wheelchair  HENT:     Head: Normocephalic and atraumatic.     Mouth/Throat:     Pharynx: No oropharyngeal exudate.  Eyes:     General: No scleral  icterus.    Conjunctiva/sclera: Conjunctivae normal.     Pupils: Pupils are equal, round, and reactive to light.  Neck:     Musculoskeletal: Normal range of motion and neck supple.  Cardiovascular:     Rate and Rhythm: Normal rate and regular rhythm.     Pulses: Normal pulses.     Heart sounds: Normal heart sounds. No murmur.  Pulmonary:     Effort: Pulmonary effort is normal. No respiratory distress.     Breath sounds: Normal breath sounds. No wheezing, rhonchi or rales.  Musculoskeletal:     Right lower leg: No edema.     Left lower leg: No edema.     Comments:  See HPI for foot exam if done Diminished pedal pulses LLE  Lymphadenopathy:     Cervical: No cervical adenopathy.  Skin:    General: Skin is warm and dry.     Findings: No rash.  Neurological:     Mental Status: She is alert.  Psychiatric:        Mood and Affect: Mood normal.        Behavior: Behavior normal.       Results for orders placed or performed in visit on 08/08/19  Hemoglobin A1c  Result Value Ref Range   Hgb A1c MFr Bld 7.3 (H) 4.6 - 6.5 %  Lipid panel  Result Value Ref Range   Cholesterol 157 0 - 200 mg/dL   Triglycerides 301.6 0.0 - 149.0 mg/dL   HDL 01.09 >32.35 mg/dL   VLDL 57.3 0.0 - 22.0 mg/dL   LDL Cholesterol 75 0 - 99 mg/dL   Total CHOL/HDL Ratio 3    NonHDL 95.94    Assessment & Plan:   Problem List Items Addressed This Visit    Wound infection complicating hardware (HCC)   Osteopenia    Reviewed latest DEXA scan showing osteopenia. Encouraged good calcium and vit D intake, start 1000 IU vit D daily. Reviewed calcium in diet. Weight bearing exercise limited by back issues.       Morbid obesity with BMI of 60.0-69.9, adult (HCC)   Hyperlipidemia associated with type 2 diabetes mellitus (HCC)    Simvastatin was stopped during latest hospitalization 03/2019. FLP stable off statin. Regardless discussed statin indication in diabetic, she desires to hold off on restarting at this time -  will reassess statin commencement at f/u visit.       Failed back surgical syndrome (Chronic)    MRSA lumbar infection with hardware involvement after latest surgery, completed 6 wks IV PICC, continues oral doxycycline - followed by ID appreciate their care.       Essential hypertension    Chronic, stable. Continue current regimen.       DM type 2 with diabetic peripheral neuropathy (HCC) - Primary    Chronic, deteriorated. Pt attributes to dietary indiscretions in setting of increased stress from complicated back surgery and Covid19 pandemic related restrictions. Encouraged renewed efforts to follow low sugar low carb diabetic diet. No med at this time. May be good candidate for weekly GLP1 RA      Relevant Orders   VAS [place] LE ART SEG MULTI (Segm&LE Reynauds)   Diminished pulses in lower extremity    Diminished pulse on left noted today - will refer for ABI.       Relevant Orders   VAS [place] LE ART SEG MULTI (Segm&LE Reynauds)   Abdominal aortic atherosclerosis (HCC)    Continues aspirin. See below Re statin.          Meds ordered this encounter  Medications   Cholecalciferol (VITAMIN D3) 25 MCG (1000 UT) CAPS    Sig: Take 1 capsule (1,000 Units total) by mouth daily.    Dispense:  30 capsule   No orders of the defined types were placed in this encounter.   Patient Instructions  Start monitoring sugars and diabetic diet  more closely.  Check with insurance on preferred sugar meter brand and let me know.  Schedule eye exam at your convenience.  Consider cholesterol medicine in diabetes to help decrease cardiovascular risk.  Mammogram returned normal. Bone density scan showed osteopenia - ensure good calcium in diet and start vitamin D 1000 units daily.  We will refer you for arterial circulation evaluation of legs due to decreased pulse on left side.   Follow up plan: Return in about 3 months (around 11/11/2019) for follow up visit.  [name], MD

## 2019-08-11 NOTE — Assessment & Plan Note (Signed)
Diminished pulse on left noted today - will refer for ABI.

## 2019-08-11 NOTE — Assessment & Plan Note (Addendum)
Chronic, deteriorated. Pt attributes to dietary indiscretions in setting of increased stress from complicated back surgery and Covid19 pandemic related restrictions. Encouraged renewed efforts to follow low sugar low carb diabetic diet. No med at this time. May be good candidate for weekly GLP1 RA

## 2019-08-19 ENCOUNTER — Ambulatory Visit (HOSPITAL_COMMUNITY)
Admission: RE | Admit: 2019-08-19 | Discharge: 2019-08-19 | Disposition: A | Discharge status: Home / Self Care | Payer: Medicare Other | Source: Ambulatory Visit | Attending: Cardiology | Admitting: Cardiology

## 2019-08-19 ENCOUNTER — Other Ambulatory Visit: Payer: Self-pay

## 2019-08-19 DIAGNOSIS — E1142 Type 2 diabetes mellitus with diabetic polyneuropathy: Secondary | ICD-10-CM | POA: Diagnosis not present

## 2019-08-19 DIAGNOSIS — R0989 Other specified symptoms and signs involving the circulatory and respiratory systems: Secondary | ICD-10-CM | POA: Diagnosis not present

## 2019-08-29 ENCOUNTER — Other Ambulatory Visit: Payer: Self-pay

## 2019-08-29 ENCOUNTER — Ambulatory Visit
Admission: RE | Admit: 2019-08-29 | Discharge: 2019-08-29 | Disposition: A | Discharge status: Home / Self Care | Payer: Medicare Other | Source: Ambulatory Visit | Attending: Nurse Practitioner | Admitting: Nurse Practitioner

## 2019-08-29 ENCOUNTER — Other Ambulatory Visit: Payer: Self-pay | Admitting: Family Medicine

## 2019-08-29 DIAGNOSIS — Z122 Encounter for screening for malignant neoplasm of respiratory organs: Secondary | ICD-10-CM | POA: Diagnosis not present

## 2019-08-29 DIAGNOSIS — Z87891 Personal history of nicotine dependence: Secondary | ICD-10-CM | POA: Diagnosis not present

## 2019-08-31 ENCOUNTER — Encounter: Payer: Self-pay | Admitting: *Deleted

## 2019-09-04 ENCOUNTER — Other Ambulatory Visit: Payer: Self-pay | Admitting: Family Medicine

## 2019-09-09 DIAGNOSIS — E119 Type 2 diabetes mellitus without complications: Secondary | ICD-10-CM | POA: Diagnosis not present

## 2019-09-09 DIAGNOSIS — H5213 Myopia, bilateral: Secondary | ICD-10-CM | POA: Diagnosis not present

## 2019-09-09 DIAGNOSIS — H16223 Keratoconjunctivitis sicca, not specified as Sjogren's, bilateral: Secondary | ICD-10-CM | POA: Diagnosis not present

## 2019-09-09 DIAGNOSIS — H0288A Meibomian gland dysfunction right eye, upper and lower eyelids: Secondary | ICD-10-CM | POA: Diagnosis not present

## 2019-09-09 DIAGNOSIS — Z9842 Cataract extraction status, left eye: Secondary | ICD-10-CM | POA: Diagnosis not present

## 2019-09-09 DIAGNOSIS — Z9841 Cataract extraction status, right eye: Secondary | ICD-10-CM | POA: Diagnosis not present

## 2019-09-09 DIAGNOSIS — H04123 Dry eye syndrome of bilateral lacrimal glands: Secondary | ICD-10-CM | POA: Diagnosis not present

## 2019-09-09 LAB — HM DIABETES EYE EXAM

## 2019-09-12 ENCOUNTER — Encounter: Payer: Self-pay | Admitting: Family Medicine

## 2019-09-27 ENCOUNTER — Telehealth: Payer: Self-pay | Admitting: *Deleted

## 2019-09-27 NOTE — Telephone Encounter (Signed)
Patient's daughter called for advice. They feel the diflucan is not effective any more.  She is having yeast rash under her breasts, behind her legs, and in her vaginal area.  She started another round of diflucan over the weekend. She is also using a combination of spray (tinactin) and powder (nystatin) topically under her breasts and behind her knees/legs. Her daughter reports that the vaginal burning is the biggest concern. She takes a fluid pill and needs to urinate frequently, has some incontinence (she does wear incontinence pads). They will focus this week on improving air flow/dryness in these areas, will consider barrier cream to reduce any irritation from incontinence, is scheduled to follow up 1/6 with Dr. Baxter Flattery. Is there anything else that she can do before follow up 1/6? Landis Gandy, RN

## 2019-09-28 NOTE — Telephone Encounter (Signed)
What dose of fluconazole is she taking? The last order that was in the chart was back from beginning of November for 7 days. Thanks!

## 2019-09-28 NOTE — Telephone Encounter (Signed)
Fluconazole 400 mg daily for 7 days. It was written with refills available. She just filled the last refill last week.

## 2019-10-03 ENCOUNTER — Telehealth: Payer: Self-pay

## 2019-10-03 NOTE — Telephone Encounter (Signed)
COVID-19 Pre-Screening Questions:  Do you currently have a fever (>100 F), chills or unexplained body aches? NO  Are you currently experiencing new cough, shortness of breath, sore throat, runny nose? NO, PATIENT SAID SHE IS JUST HAVING SEASONAL ALLERGIES.  Marland Kitchen  Have you recently travelled outside the state of [place] in the last 14 days?NO .  Have you been in contact with someone that is currently pending confirmation of Covid19 testing or has been confirmed to have the Covid19 virus?NO  **If the patient answers NO to ALL questions -  advise the patient to please call the clinic before coming to the office should any symptoms develop.

## 2019-10-04 ENCOUNTER — Ambulatory Visit (INDEPENDENT_AMBULATORY_CARE_PROVIDER_SITE_OTHER): Payer: Medicare Other | Admitting: Internal Medicine

## 2019-10-04 ENCOUNTER — Other Ambulatory Visit: Payer: Self-pay | Admitting: *Deleted

## 2019-10-04 ENCOUNTER — Encounter: Payer: Self-pay | Admitting: Internal Medicine

## 2019-10-04 ENCOUNTER — Other Ambulatory Visit: Payer: Self-pay

## 2019-10-04 VITALS — BP 157/64 | HR 92 | Temp 97.9°F | Wt 394.0 lb

## 2019-10-04 DIAGNOSIS — M4626 Osteomyelitis of vertebra, lumbar region: Secondary | ICD-10-CM | POA: Diagnosis not present

## 2019-10-04 DIAGNOSIS — A4902 Methicillin resistant Staphylococcus aureus infection, unspecified site: Secondary | ICD-10-CM | POA: Diagnosis not present

## 2019-10-04 DIAGNOSIS — B373 Candidiasis of vulva and vagina: Secondary | ICD-10-CM

## 2019-10-04 DIAGNOSIS — B3731 Acute candidiasis of vulva and vagina: Secondary | ICD-10-CM

## 2019-10-04 DIAGNOSIS — I10 Essential (primary) hypertension: Secondary | ICD-10-CM | POA: Diagnosis not present

## 2019-10-04 MED ORDER — FLUCONAZOLE 200 MG PO TABS: 400.0000 mg | ORAL_TABLET | Freq: Every day | ORAL | 0 refills | Status: DC

## 2019-10-04 NOTE — Progress Notes (Signed)
RFV: follow up for MRSA discitis and yeast infection  Patient ID: Amy Barnes, female   DOB: [birth year], 69 y.o.   MRN: [id number]  HPI Having recurrent yeast infection, vaginal as well as cutaneous, occasional dysphagia. No thrush Back pain intermittent with weather. She states that she notices skin rash on chest, groin. Areas of humidity mostly.   Lab Results  Component Value Date   ESRSEDRATE 25 08/04/2019     Outpatient Encounter Medications as of 10/04/2019  Medication Sig  . albuterol (PROVENTIL HFA;VENTOLIN HFA) 108 (90 Base) MCG/ACT inhaler Inhale 2 puffs into the lungs every 6 (six) hours as needed for wheezing or shortness of breath.  Marland Kitchen amLODipine (NORVASC) 5 MG tablet Take 1 tablet (5 mg total) by mouth daily.  Marland Kitchen aspirin 81 MG EC tablet Take 81 mg by mouth at bedtime.   . celecoxib (CELEBREX) 200 MG capsule TAKE ONE CAPSULE BY MOUTH DAILY  . Cholecalciferol (VITAMIN D3) 25 MCG (1000 UT) CAPS Take 1 capsule (1,000 Units total) by mouth daily.  . cyclobenzaprine (FLEXERIL) 10 MG tablet TAKE 1 TABLET BY MOUTH 3 TIMES EVERY DAY AS NEEDED FOR MUSCLE SPASMS  . doxycycline (VIBRA-TABS) 100 MG tablet Take 100 mg by mouth 2 (two) times daily.  . fesoterodine (TOVIAZ) 8 MG TB24 tablet Take 1 tablet (8 mg total) by mouth daily.  Marland Kitchen FLUoxetine (PROZAC) 40 MG capsule Take by mouth daily.  . Fluticasone-Salmeterol (ADVAIR) 250-50 MCG/DOSE AEPB Inhale 2 puffs into the lungs at bedtime.   . furosemide (LASIX) 40 MG tablet Take 40 mg by mouth daily.   Marland Kitchen lamoTRIgine (LAMICTAL) 200 MG tablet Take 200 mg by mouth daily.    Marland Kitchen levothyroxine (SYNTHROID) 150 MCG tablet Take 1 tablet (150 mcg total) by mouth at bedtime.  Marland Kitchen losartan (COZAAR) 100 MG tablet TAKE 1 TABLET BY MOUTH EVERY DAY  . methocarbamol (ROBAXIN-750) 750 MG tablet Take 1 tablet (750 mg total) by mouth every 6 (six) hours as needed for muscle spasms.  . montelukast (SINGULAIR) 10 MG tablet TAKE 1 TABLET BY MOUTH EVERYDAY AT BEDTIME  (Patient taking differently: Take 10 mg by mouth at bedtime. )  . Multiple Vitamin (MULTIVITAMIN WITH MINERALS) TABS tablet Take 2 tablets by mouth daily.   Marland Kitchen nystatin (MYCOSTATIN/NYSTOP) powder Apply topically 4 (four) times daily.  . ondansetron (ZOFRAN) 4 MG tablet Take 1 tablet (4 mg total) by mouth 2 (two) times daily.  . ondansetron (ZOFRAN) 8 MG tablet Take 8 mg by mouth every 8 (eight) hours as needed for nausea or vomiting.   . Oxcarbazepine (TRILEPTAL) 300 MG tablet Take 300-600 mg by mouth See admin instructions. Take 300 mg by mouth in the morning and 600 mg at night  . oxybutynin (DITROPAN XL) 10 MG 24 hr tablet Take 1 tablet (10 mg total) by mouth at bedtime.  Marland Kitchen oxyCODONE 10 MG TABS Take 1 tablet (10 mg total) by mouth every 4 (four) hours as needed for severe pain ((score 7 to 10)).  Marland Kitchen pantoprazole (PROTONIX) 40 MG tablet Take 1 tablet (40 mg total) by mouth daily.  . promethazine (PHENERGAN) 25 MG tablet Take 25 mg by mouth every 6 (six) hours as needed for nausea or vomiting.  . traMADol (ULTRAM) 50 MG tablet Take 50-100 mg by mouth every 6 (six) hours as needed.   No facility-administered encounter medications on file as of 10/04/2019.     Patient Active Problem List   Diagnosis Date Noted  . Diminished pulses in lower  extremity 08/11/2019  . Osteopenia 08/11/2019  . Wound infection complicating hardware (HCC) 04/01/2019  . 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 Bilateral lower extremity edema 11/29/2018  . Neurogenic urinary incontinence 11/29/2018  . Urinary incontinence 11/29/2018  . Bladder spasms 11/29/2018  . Discogenic low back pain 11/29/2018  . Chronic sacroiliac joint pain (Bilateral) (R>L) 11/29/2018  . Spondylosis without myelopathy or radiculopathy, lumbar region 11/09/2018  . Chronic low back pain (Bilateral) (L>R) w/o sciatica 11/09/2018  . History of hip replacement (Left) 10/13/2018  . History of back surgery 10/13/2018  .  Vitamin D insufficiency 10/13/2018  . Osteoarthritis of facet joint of lumbar spine 10/13/2018  . Osteoarthritis involving multiple joints 10/13/2018  . Cervical radiculopathy (Left) 10/13/2018  . Lumbar facet arthropathy (Bilateral) 10/13/2018  . Lumbar facet syndrome (Bilateral) (L>R) 10/13/2018  . Abnormal MRI, lumbar spine (09/16/2018) 10/13/2018  . Lumbar nerve root compression (L4) (Left) 10/13/2018  . Lumbar foraminal stenosis 10/13/2018  . 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 Chronic low back pain (Primary Area of Pain) (Bilateral) (L>R) w/ sciatica (Left) 09/14/2018  . Chronic lower extremity pain (Secondary Area of Pain) (Left) 09/14/2018  . Chronic hip pain [facility] Area of Pain) (Bilateral) (L>R) 09/14/2018  . Chronic knee pain (Fourth Area of Pain) (Bilateral) (R>L) 09/14/2018  . Pharmacologic therapy 09/14/2018  . Disorder of skeletal system 09/14/2018  . Problems influencing health status 09/14/2018  . Vaginal itching 07/12/2018  . Primary osteoarthritis of first carpometacarpal joint of left hand 04/20/2018  . 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 01/16/2018  . PAH (pulmonary artery hypertension) (HCC) 08/03/2017  . Advanced care planning/counseling discussion 06/25/2017  . Synovial cyst of lumbar facet joint 11/13/2016  . Thoracic aortic atherosclerosis (HCC)   . CAD (coronary artery disease) 07/30/2016  . Centrilobular emphysema (HCC) 07/30/2016  . Ex-smoker 07/08/2016  . Nonspecific abnormal electrocardiogram (ECG) (EKG) 07/08/2016  . Chronic lumbar radicular pain (L4) (Bilateral) 06/09/2016  . DOE (dyspnea on exertion) 05/22/2016  . Degenerative joint disease (DJD) of hip 04/17/2016  . Somatic dysfunction of sacroiliac joint 04/17/2016  . S/P Lumbar Fusion (L3-4 PLIF) 04/17/2016  . Cervical fusion syndrome 04/17/2016  . Pedal edema 02/19/2016  . Chronic pain syndrome 02/19/2016  .  Abdominal aortic atherosclerosis (HCC) 12/11/2015  . MRSA (methicillin resistant Staphylococcus aureus) infection 10/11/2012  . Urinary urgency 07/28/2012  . Infection or inflammatory reaction due to internal joint prosthesis (HCC) 12/03/2011  . Morbid obesity with BMI of 60.0-69.9, adult (HCC) 05/13/2011  . Essential hypertension 05/17/2010  . HIP PAIN 12/25/2009  . Cervical radiculopathy (Right) 12/25/2009  . Benign positional vertigo 07/21/2008  . Hyperlipidemia associated with type 2 diabetes mellitus (HCC) 08/16/2007  . Hypothyroidism 07/30/2007  . DM type 2 with diabetic peripheral neuropathy (HCC) 07/30/2007  . ANEMIA-NOS 07/30/2007  . Bipolar disorder (HCC) 07/30/2007  . Allergic rhinitis 07/30/2007  . Asthma 07/30/2007  . GERD 07/30/2007  . Osteoarthritis 07/30/2007  . OSA on CPAP 07/30/2007     Health Maintenance Due  Topic Date Due  . DTAP VACCINES (1) 08/07/1951  . PNA vac Low Risk Adult (2 of 2 - PPSV23) 01/13/2019    Soc hx: no smoking or alcohol use  Review of Systems 12 point ros has been reviewed.positive pertinents listed in hpi.   Physical Exam   BP (!) 157/64   Pulse 92   Temp 97.9 F (36.6 C) (Oral)   Wt Marland Kitchen)  394 lb (178.7 kg)   LMP  (LMP Unknown)   BMI 67.63 kg/m   Physical Exam  Constitutional:  oriented to person, place, and time. appears well-developed and well-nourished. No distress.  HENT: [place]/AT, PERRLA, no scleral icterus Mouth/Throat: Oropharynx is clear and moist. No oropharyngeal exudate. Glossy tonge Cardiovascular: Normal rate, regular rhythm and normal heart sounds. Exam reveals no gallop and no friction rub.  No murmur heard.  Pulmonary/Chest: Effort normal and breath sounds normal. No respiratory distress.  has no wheezes.  Skin: Skin is warm and dry. No rash noted. No erythema. Redness chest wall near breast Psychiatric: a normal mood and affect.  behavior is normal.   CBC Lab Results  Component Value Date   WBC 6.2 08/04/2019    RBC 4.32 08/04/2019   HGB 12.2 08/04/2019   HCT 37.6 08/04/2019   PLT 229 08/04/2019   MCV 87.0 08/04/2019   MCH 28.2 08/04/2019   MCHC 32.4 08/04/2019   RDW 13.2 08/04/2019   LYMPHSABS 1,965 08/04/2019   MONOABS 0.5 01/14/2019   EOSABS 242 08/04/2019    BMET Lab Results  Component Value Date   NA 140 08/04/2019   K 4.4 08/04/2019   CL 103 08/04/2019   CO2 28 08/04/2019   GLUCOSE 186 (H) 08/04/2019   BUN 21 08/04/2019   CREATININE 0.97 08/04/2019   CALCIUM 9.7 08/04/2019   GFRNONAA 60 08/04/2019   GFRAA 70 08/04/2019     Assessment and Plan  MRSA osteomyelitis/discitis with hardware = we will decrease doxy to 100mg  daily,   Thrush, and candidal skin infection = likely due to being on antibiotics, would like to see if treating for longer course would give her more reprieve from candida skin infection. Will do  fluconazole 400mg  daily x 3 wk  Hypertension = recommend to see her pcp to see if need to adjust medications  Next appt will televisit

## 2019-10-04 NOTE — Progress Notes (Signed)
Patient requested fluconazole to be sent to [facility] instead of [facility] for better out-of-pocket cost. Done. Andree Coss, RN

## 2019-10-08 ENCOUNTER — Other Ambulatory Visit: Payer: Self-pay | Admitting: Family Medicine

## 2019-10-14 ENCOUNTER — Encounter: Payer: Self-pay | Admitting: Family Medicine

## 2019-10-17 MED ORDER — AMLODIPINE BESYLATE 5 MG PO TABS: 5.0000 mg | ORAL_TABLET | Freq: Every day | ORAL | 0 refills | Status: DC

## 2019-10-17 NOTE — Telephone Encounter (Signed)
E-scribed refill to [facility].  Notified pt via MyChart.

## 2019-10-21 ENCOUNTER — Ambulatory Visit (INDEPENDENT_AMBULATORY_CARE_PROVIDER_SITE_OTHER): Payer: Medicare Other | Admitting: Urology

## 2019-10-21 ENCOUNTER — Encounter: Payer: Self-pay | Admitting: Urology

## 2019-10-21 ENCOUNTER — Other Ambulatory Visit: Payer: Self-pay

## 2019-10-21 VITALS — BP 165/74 | HR 80 | Ht 64.0 in | Wt 384.0 lb

## 2019-10-21 DIAGNOSIS — R350 Frequency of micturition: Secondary | ICD-10-CM

## 2019-10-21 DIAGNOSIS — N3946 Mixed incontinence: Secondary | ICD-10-CM | POA: Diagnosis not present

## 2019-10-21 MED ORDER — TOLTERODINE TARTRATE ER 4 MG PO CP24: 4.0000 mg | ORAL_CAPSULE | Freq: Every day | ORAL | 11 refills | Status: DC

## 2019-10-21 NOTE — Patient Instructions (Signed)

## 2019-10-21 NOTE — Progress Notes (Signed)
10/21/2019 1:48 PM   Amy Barnes 1951-04-13 [id number]  Referring provider: Ria Bush, MD [place],  [place] [address]  No chief complaint on file.   HPI:  F/u incontinence. She feels like she is having a "bladder spasm". She will get a sudden urge to void and then when she stands she will lose her whole bladder. Sometimes she does not feel like she has a good stream or is empty. She has not had any dysuria or gross hematuria. She started oxybutynin 5 mg XL and has not seen a lot of change. Her symptoms were worse after a back injection in 2020. Cath specimen obtained 125 cc, she had not voided in a couple of hours. Her pad is dry. She is drinking plenty of water. She drinks three32 ounce bottles a day. She has no bulge/prolapse symptoms. 3 normal spontaneous vaginal deliveries.  Shehasneurogenic and nonneurogenic factors that might affect her voiding. Her morbid obesity affects her mobility. She also has significant lumbar degenerative disc disease and compression at L4. Neurosurgery may be planning back surgery and general surgery may be planning bypass.  She did undergo CT scan of the abdomen and pelvis in 2017 and I reviewed those images. They were benign and her bladder was not distended. Her creatinine was 0.66.   Pelvic exam normal 02/2019. We increased the oxybutynin to 10 mg XL. She had lower back surgery and still having a lot of pain. She had sudden urgency and UUI. No dry mouth or constipation. She's had LE swelling. A friend told her about " tolterodine 8 mg" a rx was sent for Toviaz 8 mg but expensive. Incontinence has improved - down to 2 ppd from 10. On oxyb 10 XL.   PMH: Past Medical History:  Diagnosis Date  . Allergic rhinitis   . Ambulates with cane    straight  . Anemia   . Anxiety   . Asthma    seasonal  . Bipolar affective disorder ([place])    takes Synthroid meds for Bipolar  . CAD (coronary artery disease)  07/2016   by CT scan  . Carpal tunnel syndrome    had surgery but occasional still has some issues per patient  . Cataract   . Centrilobular emphysema ([place]) 07/2016   by CT scan - pt not aware of this  . Constipation due to pain medication   . Depression with anxiety   . Diabetes mellitus    type 2 - no meds diet controlled  . GERD (gastroesophageal reflux disease)   . History of blood transfusion   . 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 Hyperlipidemia   . Hypertension   . OSA (obstructive sleep apnea)    no longer using cpap, uses a bed that raises and lowers hob  . Osteoarthritis   . Osteoarthritis   . Osteopenia 08/11/2019   DEXA 07/2019 - T -1.1 R femur (osteopenia)  . Pneumonia   . Restless legs   . Septic arthritis ([place]) 10/11/2012  . Shingles 06/30/2016  . Shortness of breath    with exertion  . Status post revision of total hip replacement bilateral   prosthetic infection R 2013, L 2015  . SVD (spontaneous vaginal delivery)    x 3  . Thoracic aortic atherosclerosis ([place]) 11/207   by CT    Surgical History: Past Surgical History:  Procedure Laterality Date  . BREAST CYST ASPIRATION    .  CARPAL TUNNEL RELEASE Right 05/15/2011  . CARPAL TUNNEL RELEASE Left 12/24/2017   Procedure: LEFT CARPAL TUNNEL RELEASE;  Surgeon: [name], MD;  Location: [facility];  Service: Orthopedics;  Laterality: Left;  . CERVICAL FUSION  2012   C2/3/4  . COLONOSCOPY WITH PROPOFOL N/A 12/31/2015   diverticulosis, int hem, o/w normal rpt 10 yrs ([place])  . EYE SURGERY Bilateral    cataract surgery with lens implant  . FOOT SURGERY  1982   bone spur  . I & D EXTREMITY  09/06/2012   Budd Palmer, MD; Right;  I&D of right thigh  . I & D EXTREMITY Left 07/2013   wound vac - daily doxycycline indefinitely  . KNEE ARTHROSCOPY  09/01/2012   Dannielle Huh, MD;  Right  . LUMBAR FUSION  01/08/2012   L3-4  . LUMBAR FUSION  02/2019    unexpectedly discovered MRSA infection - pus Lovell Sheehan)   . LUMBAR LAMINECTOMY/DECOMPRESSION MICRODISCECTOMY N/A 11/13/2016   LAMINOTOMY/LAMINECTOMY LUMBAR FOUR LUMBAR FIVE  WITH RESECTION OF SYNOVIAL CYST;  Surgeon: Tressie Stalker, MD  . NECK SURGERY     Herniated disk C2,3,4  . NOSE SURGERY    . PARTIAL HYSTERECTOMY  1984   for mennorhagia, ovaries remain  . REVISION TOTAL HIP ARTHROPLASTY Left 08/28/2011  . TMJ ARTHROPLASTY  1982  . TOTAL HIP ARTHROPLASTY Left 2002  . TRIGGER FINGER RELEASE Right 05/15/2011   long finger    Home Medications:  Allergies as of 10/21/2019      Reactions   Cephalexin Hives   Hydrocodone Other (See Comments)   Reaction:  Hallucinations    Risperidone And Related Other (See Comments)   Reaction:  Made pt excessively sleepy   Seroquel [quetiapine Fumarate] Other (See Comments)   Reaction:  Made pt excessively sleepy   Sulfa Antibiotics Rash      Medication List       Accurate as of October 21, 2019  1:48 PM. If you have any questions, ask your nurse or doctor.        albuterol 108 (90 Base) MCG/ACT inhaler Commonly known as: VENTOLIN HFA Inhale 2 puffs into the lungs every 6 (six) hours as needed for wheezing or shortness of breath.   amLODipine 5 MG tablet Commonly known as: NORVASC Take 1 tablet (5 mg total) by mouth daily.   aspirin 81 MG EC tablet Take 81 mg by mouth at bedtime.   celecoxib 200 MG capsule Commonly known as: CELEBREX TAKE ONE CAPSULE BY MOUTH DAILY   cyclobenzaprine 10 MG tablet Commonly known as: FLEXERIL TAKE 1 TABLET BY MOUTH 3 TIMES EVERY DAY AS NEEDED FOR MUSCLE SPASMS   doxycycline 100 MG tablet Commonly known as: VIBRA-TABS Take 100 mg by mouth 2 (two) times daily.   fesoterodine 8 MG Tb24 tablet Commonly known as: TOVIAZ Take 1 tablet (8 mg total) by mouth daily.   fluconazole 200 MG tablet Commonly known as: DIFLUCAN Take 2 tablets (400 mg total) by mouth daily.   FLUoxetine 40 MG capsule  Commonly known as: PROZAC Take by mouth daily.   Fluticasone-Salmeterol 250-50 MCG/DOSE Aepb Commonly known as: ADVAIR Inhale 2 puffs into the lungs at bedtime.   furosemide 40 MG tablet Commonly known as: LASIX Take 40 mg by mouth daily.   lamoTRIgine 200 MG tablet Commonly known as: LAMICTAL Take 200 mg by mouth daily.   levothyroxine 150 MCG tablet Commonly known as: SYNTHROID Take 1 tablet (150 mcg total) by mouth at bedtime.  losartan 100 MG tablet Commonly known as: COZAAR TAKE 1 TABLET BY MOUTH EVERY DAY   methocarbamol 750 MG tablet Commonly known as: Robaxin-750 Take 1 tablet (750 mg total) by mouth every 6 (six) hours as needed for muscle spasms.   montelukast 10 MG tablet Commonly known as: SINGULAIR TAKE 1 TABLET BY MOUTH EVERYDAY AT BEDTIME What changed: See the new instructions.   multivitamin with minerals Tabs tablet Take 2 tablets by mouth daily.   nystatin powder Commonly known as: MYCOSTATIN/NYSTOP Apply topically 4 (four) times daily.   ondansetron 4 MG tablet Commonly known as: Zofran Take 1 tablet (4 mg total) by mouth 2 (two) times daily.   ondansetron 8 MG tablet Commonly known as: ZOFRAN Take 8 mg by mouth every 8 (eight) hours as needed for nausea or vomiting.   Oxcarbazepine 300 MG tablet Commonly known as: TRILEPTAL Take 300-600 mg by mouth See admin instructions. Take 300 mg by mouth in the morning and 600 mg at night   oxybutynin 10 MG 24 hr tablet Commonly known as: Ditropan XL Take 1 tablet (10 mg total) by mouth at bedtime.   Oxycodone HCl 10 MG Tabs Take 1 tablet (10 mg total) by mouth every 4 (four) hours as needed for severe pain ((score 7 to 10)).   pantoprazole 40 MG tablet Commonly known as: PROTONIX Take 1 tablet (40 mg total) by mouth daily.   promethazine 25 MG tablet Commonly known as: PHENERGAN Take 25 mg by mouth every 6 (six) hours as needed for nausea or vomiting.   traMADol 50 MG tablet Commonly known  as: ULTRAM Take 50-100 mg by mouth every 6 (six) hours as needed.   Vitamin D3 25 MCG (1000 UT) Caps Take 1 capsule (1,000 Units total) by mouth daily.       Allergies:  Allergies  Allergen Reactions  . Cephalexin Hives  . Hydrocodone Other (See Comments)    Reaction:  Hallucinations   . Risperidone And Related Other (See Comments)    Reaction:  Made pt excessively sleepy  . Seroquel [Quetiapine Fumarate] Other (See Comments)    Reaction:  Made pt excessively sleepy  . Sulfa Antibiotics Rash    Family History: Family History  Problem Relation Age of Onset  . Aneurysm Father 62       brain  . Alcohol abuse Father   . Cancer Father        possibly  . CAD Other        several siblings  . Cancer Brother        prostate  . Diabetes Brother   . Diabetes Sister   . Anesthesia problems Neg Hx   . Hypotension Neg Hx   . Malignant hyperthermia Neg Hx   . Pseudochol deficiency Neg Hx   . Breast cancer Neg Hx     Social History:  reports that she quit smoking about 10 years ago. Her smoking use included cigarettes. She has a 60.00 pack-year smoking history. She has never used smokeless tobacco. She reports current alcohol use of about 21.0 standard drinks of alcohol per week. She reports that she does not use drugs.  ROS:                                        Physical Exam: LMP  (LMP Unknown)   Constitutional:  Alert and oriented, No acute distress. HEENT: [place]  AT, moist mucus membranes.  Trachea midline, no masses. Cardiovascular: No clubbing, cyanosis. + bilateral LE edema. Respiratory: Normal respiratory effort, no increased work of breathing. GI: Abdomen is soft, nontender, nondistended, no abdominal masses GU: No CVA tenderness Skin: No rashes, bruises or suspicious lesions. Neurologic: Grossly intact, no focal deficits, moving all 4 extremities. Psychiatric: Normal mood and affect.  Laboratory Data: Lab Results  Component Value Date    WBC 6.2 08/04/2019   HGB 12.2 08/04/2019   HCT 37.6 08/04/2019   MCV 87.0 08/04/2019   PLT 229 08/04/2019    Lab Results  Component Value Date   CREATININE 0.97 08/04/2019    No results found for: PSA  No results found for: TESTOSTERONE  Lab Results  Component Value Date   HGBA1C 7.3 (H) 08/08/2019    Urinalysis    Component Value Date/Time   COLORURINE YELLOW (A) 11/27/2015 0224   APPEARANCEUR Clear 03/04/2019 0948   LABSPEC 1.045 (H) 11/27/2015 0224   LABSPEC 1.017 09/25/2012 2123   PHURINE 5.0 11/27/2015 0224   GLUCOSEU Negative 03/04/2019 0948   GLUCOSEU Negative 09/25/2012 2123   HGBUR NEGATIVE 11/27/2015 0224   HGBUR negative 05/17/2010 0946   BILIRUBINUR Negative 03/04/2019 0948   BILIRUBINUR Negative 09/25/2012 2123   KETONESUR NEGATIVE 11/27/2015 0224   PROTEINUR Negative 03/04/2019 0948   PROTEINUR 30 (A) 11/27/2015 0224   UROBILINOGEN 0.2 01/14/2019 0934   UROBILINOGEN 1.0 08/30/2012 1511   NITRITE Negative 03/04/2019 0948   NITRITE NEGATIVE 11/27/2015 0224   LEUKOCYTESUR Trace (A) 03/04/2019 0948   LEUKOCYTESUR Negative 09/25/2012 2123    Lab Results  Component Value Date   LABMICR See below: 03/04/2019   WBCUA 11-30 (A) 03/04/2019   LABEPIT 0-10 03/04/2019   BACTERIA None seen 03/04/2019    Pertinent Imaging: n/a No results found for this or any previous visit. No results found for this or any previous visit. No results found for this or any previous visit. No results found for this or any previous visit. No results found for this or any previous visit. No results found for this or any previous visit. No results found for this or any previous visit. No results found for this or any previous visit.  Assessment & Plan:    Urgency, mixed incontinence - trial of tolterodine or oxybutynin 10 if expensive. F/u in 1 year or sooner if issues.   No follow-ups on file.  Jerilee Field, MD  [facility] [address], [address] [place], [place] [address] [phone]

## 2019-10-26 DIAGNOSIS — F3181 Bipolar II disorder: Secondary | ICD-10-CM | POA: Diagnosis not present

## 2019-10-30 ENCOUNTER — Encounter: Payer: Self-pay | Admitting: Emergency Medicine

## 2019-10-30 ENCOUNTER — Emergency Department
Admission: EM | Admit: 2019-10-30 | Discharge: 2019-10-30 | Disposition: A | Discharge status: Home / Self Care | Payer: Medicare Other | Attending: Emergency Medicine | Admitting: Emergency Medicine

## 2019-10-30 ENCOUNTER — Other Ambulatory Visit: Payer: Self-pay

## 2019-10-30 DIAGNOSIS — E039 Hypothyroidism, unspecified: Secondary | ICD-10-CM | POA: Insufficient documentation

## 2019-10-30 DIAGNOSIS — E119 Type 2 diabetes mellitus without complications: Secondary | ICD-10-CM | POA: Diagnosis not present

## 2019-10-30 DIAGNOSIS — I2510 Atherosclerotic heart disease of native coronary artery without angina pectoris: Secondary | ICD-10-CM | POA: Diagnosis not present

## 2019-10-30 DIAGNOSIS — R1110 Vomiting, unspecified: Secondary | ICD-10-CM | POA: Insufficient documentation

## 2019-10-30 DIAGNOSIS — E870 Hyperosmolality and hypernatremia: Secondary | ICD-10-CM | POA: Insufficient documentation

## 2019-10-30 DIAGNOSIS — Z7982 Long term (current) use of aspirin: Secondary | ICD-10-CM | POA: Insufficient documentation

## 2019-10-30 DIAGNOSIS — E871 Hypo-osmolality and hyponatremia: Secondary | ICD-10-CM | POA: Diagnosis not present

## 2019-10-30 DIAGNOSIS — R112 Nausea with vomiting, unspecified: Secondary | ICD-10-CM

## 2019-10-30 DIAGNOSIS — R301 Vesical tenesmus: Secondary | ICD-10-CM | POA: Diagnosis not present

## 2019-10-30 DIAGNOSIS — Z79899 Other long term (current) drug therapy: Secondary | ICD-10-CM | POA: Insufficient documentation

## 2019-10-30 DIAGNOSIS — R102 Pelvic and perineal pain: Secondary | ICD-10-CM | POA: Diagnosis not present

## 2019-10-30 LAB — URINALYSIS, COMPLETE (UACMP) WITH MICROSCOPIC
Bilirubin Urine: NEGATIVE
Glucose, UA: NEGATIVE mg/dL
Ketones, ur: NEGATIVE mg/dL
Leukocytes,Ua: NEGATIVE
Nitrite: NEGATIVE
Protein, ur: 30 mg/dL — AB
Specific Gravity, Urine: 1.005 (ref 1.005–1.030)
pH: 8 (ref 5.0–8.0)

## 2019-10-30 LAB — COMPREHENSIVE METABOLIC PANEL
ALT: 48 U/L — ABNORMAL HIGH (ref 0–44)
AST: 38 U/L (ref 15–41)
Albumin: 4.6 g/dL (ref 3.5–5.0)
Alkaline Phosphatase: 99 U/L (ref 38–126)
Anion gap: 14 (ref 5–15)
BUN: 9 mg/dL (ref 8–23)
CO2: 24 mmol/L (ref 22–32)
Calcium: 9.6 mg/dL (ref 8.9–10.3)
Chloride: 90 mmol/L — ABNORMAL LOW (ref 98–111)
Creatinine, Ser: 0.55 mg/dL (ref 0.44–1.00)
GFR calc Af Amer: >60 mL/min (ref >60)
GFR calc non Af Amer: >60 mL/min (ref >60)
Glucose, Bld: 201 mg/dL — ABNORMAL HIGH (ref 70–99)
Potassium: 3.9 mmol/L (ref 3.5–5.1)
Sodium: 128 mmol/L — ABNORMAL LOW (ref 135–145)
Total Bilirubin: 0.8 mg/dL (ref 0.3–1.2)
Total Protein: 8 g/dL (ref 6.5–8.1)

## 2019-10-30 LAB — CBC
HCT: 42.8 % (ref 36.0–46.0)
Hemoglobin: 14.2 g/dL (ref 12.0–15.0)
MCH: 27.8 pg (ref 26.0–34.0)
MCHC: 33.2 g/dL (ref 30.0–36.0)
MCV: 83.9 fL (ref 80.0–100.0)
Platelets: 299 10*3/uL (ref 150–400)
RBC: 5.1 MIL/uL (ref 3.87–5.11)
RDW: 15.7 % — ABNORMAL HIGH (ref 11.5–15.5)
WBC: 7.8 10*3/uL (ref 4.0–10.5)
nRBC: 0 % (ref 0.0–0.2)

## 2019-10-30 LAB — LIPASE, BLOOD: Lipase: 23 U/L (ref 11–51)

## 2019-10-30 MED ORDER — DIAZEPAM 5 MG/ML IJ SOLN
5.0000 mg | Freq: Once | INTRAMUSCULAR | Status: AC
  Administered 2019-10-30: 5 mg via INTRAVENOUS
  Filled 2019-10-30: qty 2

## 2019-10-30 MED ORDER — ONDANSETRON 4 MG PO TBDP: ORAL_TABLET | ORAL | 0 refills | Status: DC

## 2019-10-30 MED ORDER — SODIUM CHLORIDE 0.9 % IV BOLUS
1000.0000 mL | Freq: Once | INTRAVENOUS | Status: AC
  Administered 2019-10-30: 1000 mL via INTRAVENOUS

## 2019-10-30 MED ORDER — ONDANSETRON HCL 4 MG/2ML IJ SOLN: 4.0000 mg | Freq: Once | INTRAMUSCULAR | Status: AC

## 2019-10-30 MED ORDER — ONDANSETRON HCL 4 MG/2ML IJ SOLN
INTRAMUSCULAR | Status: AC
  Administered 2019-10-30: 4 mg via INTRAVENOUS
  Filled 2019-10-30: qty 2

## 2019-10-30 MED ORDER — SODIUM CHLORIDE 0.9% FLUSH: 3.0000 mL | Freq: Once | INTRAVENOUS | Status: DC

## 2019-10-30 NOTE — ED Notes (Signed)
Pt verbalized understanding of d/c instructions and due to the signature pad not working, pt gave this RN permission to make a note ( this serves as the pts signature and me as a witness.)

## 2019-10-30 NOTE — ED Provider Notes (Signed)
[facility] EMERGENCY DEPARTMENT Provider Note   CSN: [id number] Arrival date & time: 10/30/19  1622     History Chief Complaint  Patient presents with  . Abdominal Pain  . Emesis    Amy Barnes is a 69 y.o. female hx of asthma, CAD, GERD, overactive bladder who presented with possible drug reaction.  Patient was started on Toviaz about a week ago and soon afterwards had some vomiting.  She stopped it for several days and then restarted about 4 days ago.  She states that he has not help her bladder spasms.  She states that she has been vomiting more and her bladder spasms are persistent.  Patient denies any epigastric pain or fevers or urinary symptoms.  The history is provided by the patient.       Past Medical History:  Diagnosis Date  . Allergic rhinitis   . Ambulates with cane    straight  . Anemia   . Anxiety   . Asthma    seasonal  . Bipolar affective disorder (HCC)    takes Synthroid meds for Bipolar  . CAD (coronary artery disease) 07/2016   by CT scan  . Carpal tunnel syndrome    had surgery but occasional still has some issues per patient  . Cataract   . Centrilobular emphysema (HCC) 07/2016   by CT scan - pt not aware of this  . Constipation due to pain medication   . Depression with anxiety   . Diabetes mellitus    type 2 - no meds diet controlled  . GERD (gastroesophageal reflux disease)   . History of blood transfusion   . 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 Hyperlipidemia   . Hypertension   . OSA (obstructive sleep apnea)    no longer using cpap, uses a bed that raises and lowers hob  . Osteoarthritis   . Osteoarthritis   . Osteopenia 08/11/2019   DEXA 07/2019 - T -1.1 R femur (osteopenia)  . Pneumonia   . Restless legs   . Septic arthritis (HCC) 10/11/2012  . Shingles 06/30/2016  . Shortness of breath    with exertion  . Status post revision of total hip replacement bilateral   prosthetic infection R 2013, L 2015  . SVD (spontaneous vaginal delivery)    x 3  . Thoracic aortic atherosclerosis (HCC) 11/207   by CT    Patient Active Problem List   Diagnosis Date Noted  . Diminished pulses in lower extremity 08/11/2019  . Osteopenia 08/11/2019  . Wound infection complicating hardware (HCC) 04/01/2019  . 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 Bilateral lower extremity edema 11/29/2018  . Neurogenic urinary incontinence 11/29/2018  . Urinary incontinence 11/29/2018  . Bladder spasms 11/29/2018  . Discogenic low back pain 11/29/2018  . Chronic sacroiliac joint pain (Bilateral) (R>L) 11/29/2018  . Spondylosis without myelopathy or radiculopathy, lumbar region 11/09/2018  . Chronic low back pain (Bilateral) (L>R) w/o sciatica 11/09/2018  . History of hip replacement (Left) 10/13/2018  . History of back surgery 10/13/2018  . Vitamin D insufficiency 10/13/2018  . Osteoarthritis of facet joint of lumbar spine 10/13/2018  . Osteoarthritis involving multiple joints 10/13/2018  . Cervical radiculopathy (Left) 10/13/2018  . Lumbar facet arthropathy (Bilateral) 10/13/2018  . Lumbar facet syndrome (Bilateral) (L>R) 10/13/2018  . Abnormal MRI, lumbar spine (09/16/2018) 10/13/2018  . Lumbar nerve root compression (L4) (Left) 10/13/2018  . Lumbar  foraminal stenosis 10/13/2018  . 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 Chronic low back pain (Primary Area of Pain) (Bilateral) (L>R) w/ sciatica (Left) 09/14/2018  . Chronic lower extremity pain (Secondary Area of Pain) (Left) 09/14/2018  . Chronic hip pain [facility] Area of Pain) (Bilateral) (L>R) 09/14/2018  . Chronic knee pain (Fourth Area of Pain) (Bilateral) (R>L) 09/14/2018  . Pharmacologic therapy 09/14/2018  . Disorder of skeletal system 09/14/2018  . Problems influencing health status 09/14/2018  . Vaginal itching  07/12/2018  . Primary osteoarthritis of first carpometacarpal joint of left hand 04/20/2018  . 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 01/16/2018  . PAH (pulmonary artery hypertension) ([place]) 08/03/2017  . Advanced care planning/counseling discussion 06/25/2017  . Synovial cyst of lumbar facet joint 11/13/2016  . Thoracic aortic atherosclerosis ([place])   . CAD (coronary artery disease) 07/30/2016  . Centrilobular emphysema ([place]) 07/30/2016  . Ex-smoker 07/08/2016  . Nonspecific abnormal electrocardiogram (ECG) (EKG) 07/08/2016  . Chronic lumbar radicular pain (L4) (Bilateral) 06/09/2016  . DOE (dyspnea on exertion) 05/22/2016  . Degenerative joint disease (DJD) of hip 04/17/2016  . Somatic dysfunction of sacroiliac joint 04/17/2016  . S/P Lumbar Fusion (L3-4 PLIF) 04/17/2016  . Cervical fusion syndrome 04/17/2016  . Pedal edema 02/19/2016  . Chronic pain syndrome 02/19/2016  . Abdominal aortic atherosclerosis ([place]) 12/11/2015  . MRSA (methicillin resistant Staphylococcus aureus) infection 10/11/2012  . Urinary urgency 07/28/2012  . Infection or inflammatory reaction due to internal joint prosthesis ([place]) 12/03/2011  . Morbid obesity with BMI of 60.0-69.9, adult ([place]) 05/13/2011  . Essential hypertension 05/17/2010  . HIP PAIN 12/25/2009  . Cervical radiculopathy (Right) 12/25/2009  . Benign positional vertigo 07/21/2008  . Hyperlipidemia associated with type 2 diabetes mellitus ([place]) 08/16/2007  . Hypothyroidism 07/30/2007  . DM type 2 with diabetic peripheral neuropathy ([place]) 07/30/2007  . ANEMIA-NOS 07/30/2007  . Bipolar disorder ([place]) 07/30/2007  . Allergic rhinitis 07/30/2007  . Asthma 07/30/2007  . GERD 07/30/2007  . Osteoarthritis 07/30/2007  . OSA on CPAP 07/30/2007    Past Surgical History:  Procedure Laterality Date  . BREAST CYST ASPIRATION    . CARPAL TUNNEL RELEASE Right 05/15/2011  . CARPAL TUNNEL RELEASE Left 12/24/2017   Procedure: LEFT CARPAL TUNNEL RELEASE;   Surgeon: Leanora Cover, MD;  Location: [facility];  Service: Orthopedics;  Laterality: Left;  . CERVICAL FUSION  2012   C2/3/4  . COLONOSCOPY WITH PROPOFOL N/A 12/31/2015   diverticulosis, int hem, o/w normal rpt 10 yrs ([place])  . EYE SURGERY Bilateral    cataract surgery with lens implant  . FOOT SURGERY  1982   bone spur  . I & D EXTREMITY  09/06/2012   [name], MD; Right;  I&D of right thigh  . I & D EXTREMITY Left 07/2013   wound vac - daily doxycycline indefinitely  . KNEE ARTHROSCOPY  09/01/2012   [name], MD;  Right  . LUMBAR FUSION  01/08/2012   L3-4  . LUMBAR FUSION  02/2019   unexpectedly discovered MRSA infection - pus [name])   . LUMBAR LAMINECTOMY/DECOMPRESSION MICRODISCECTOMY N/A 11/13/2016   LAMINOTOMY/LAMINECTOMY LUMBAR FOUR LUMBAR FIVE  WITH RESECTION OF SYNOVIAL CYST;  Surgeon: Newman Pies, MD  . NECK SURGERY     Herniated disk C2,3,4  . NOSE SURGERY    . PARTIAL HYSTERECTOMY  1984   for mennorhagia, ovaries remain  . REVISION TOTAL HIP ARTHROPLASTY Left  08/28/2011  . TMJ ARTHROPLASTY  1982  . TOTAL HIP ARTHROPLASTY Left 2002  . TRIGGER FINGER RELEASE Right 05/15/2011   long finger     OB History   No obstetric history on file.     Family History  Problem Relation Age of Onset  . Aneurysm Father 62       brain  . Alcohol abuse Father   . Cancer Father        possibly  . CAD Other        several siblings  . Cancer Brother        prostate  . Diabetes Brother   . Diabetes Sister   . Anesthesia problems Neg Hx   . Hypotension Neg Hx   . Malignant hyperthermia Neg Hx   . Pseudochol deficiency Neg Hx   . Breast cancer Neg Hx     Social History   Tobacco Use  . Smoking status: Former Smoker    Packs/day: 1.50    Years: 40.00    Pack years: 60.00    Types: Cigarettes    Quit date: 04/29/2009    Years since quitting: 10.5  . Smokeless tobacco: Never Used  Substance Use Topics  . Alcohol use: Yes    Alcohol/week:  21.0 standard drinks    Types: 21 Shots of liquor per week    Comment: 3 shots/per night  . Drug use: No    Home Medications Prior to Admission medications   Medication Sig Start Date End Date Taking? Authorizing Provider  albuterol (PROVENTIL HFA;VENTOLIN HFA) 108 (90 Base) MCG/ACT inhaler Inhale 2 puffs into the lungs every 6 (six) hours as needed for wheezing or shortness of breath. 12/14/18   [name], MD  amLODipine (NORVASC) 5 MG tablet Take 1 tablet (5 mg total) by mouth daily. 10/17/19   [name], MD  aspirin 81 MG EC tablet Take 81 mg by mouth at bedtime.     [provider]  celecoxib (CELEBREX) 200 MG capsule TAKE ONE CAPSULE BY MOUTH DAILY 06/14/19   [name], MD  Cholecalciferol (VITAMIN D3) 25 MCG (1000 UT) CAPS Take 1 capsule (1,000 Units total) by mouth daily. 08/11/19   [name], MD  cyclobenzaprine (FLEXERIL) 10 MG tablet TAKE 1 TABLET BY MOUTH 3 TIMES EVERY DAY AS NEEDED FOR MUSCLE SPASMS 05/29/19   [provider]  doxycycline (VIBRA-TABS) 100 MG tablet Take 100 mg by mouth 2 (two) times daily. 07/31/19   [provider]  fesoterodine (TOVIAZ) 8 MG TB24 tablet Take 1 tablet (8 mg total) by mouth daily. 06/22/19   Jerilee Field, MD  fluconazole (DIFLUCAN) 200 MG tablet Take 2 tablets (400 mg total) by mouth daily. 10/04/19   Judyann Munson, MD  FLUoxetine (PROZAC) 40 MG capsule Take by mouth daily. 03/08/19   [provider]  Fluticasone-Salmeterol (ADVAIR) 250-50 MCG/DOSE AEPB Inhale 2 puffs into the lungs at bedtime.     [provider]  furosemide (LASIX) 40 MG tablet Take 40 mg by mouth daily.  02/03/17   [name], MD  lamoTRIgine (LAMICTAL) 200 MG tablet Take 200 mg by mouth daily.      [provider]  levothyroxine (SYNTHROID) 150 MCG tablet Take 1 tablet (150 mcg total) by mouth at bedtime. 01/21/19   [name], MD  losartan (COZAAR) 100 MG tablet TAKE 1 TABLET BY MOUTH  EVERY DAY 09/05/19   [name], MD  methocarbamol (ROBAXIN-750) 750 MG tablet Take 1 tablet (750 mg total)  by mouth every 6 (six) hours as needed for muscle spasms. 03/25/19   Bergman, Lindie Spruce D, NP  montelukast (SINGULAIR) 10 MG tablet TAKE 1 TABLET BY MOUTH EVERYDAY AT BEDTIME Patient taking differently: Take 10 mg by mouth at bedtime.  02/28/19   [name], MD  Multiple Vitamin (MULTIVITAMIN WITH MINERALS) TABS tablet Take 2 tablets by mouth daily.     [provider]  nystatin (MYCOSTATIN/NYSTOP) powder Apply topically 4 (four) times daily. 02/22/19   Reva Bores, MD  ondansetron (ZOFRAN) 4 MG tablet Take 1 tablet (4 mg total) by mouth 2 (two) times daily. 05/19/19   Judyann Munson, MD  ondansetron (ZOFRAN) 8 MG tablet Take 8 mg by mouth every 8 (eight) hours as needed for nausea or vomiting.     [provider]  Oxcarbazepine (TRILEPTAL) 300 MG tablet Take 300-600 mg by mouth See admin instructions. Take 300 mg by mouth in the morning and 600 mg at night    [provider]  oxyCODONE 10 MG TABS Take 1 tablet (10 mg total) by mouth every 4 (four) hours as needed for severe pain ((score 7 to 10)). 03/25/19   Val Eagle D, NP  pantoprazole (PROTONIX) 40 MG tablet Take 1 tablet (40 mg total) by mouth daily. 01/21/19   [name], MD  promethazine (PHENERGAN) 25 MG tablet Take 25 mg by mouth every 6 (six) hours as needed for nausea or vomiting.    [provider]  tolterodine (DETROL LA) 4 MG 24 hr capsule Take 1 capsule (4 mg total) by mouth daily. 10/21/19   Jerilee Field, MD  traMADol (ULTRAM) 50 MG tablet Take 50-100 mg by mouth every 6 (six) hours as needed. 04/27/19   [provider]    Allergies    Cephalexin, Hydrocodone, Risperidone and related, Seroquel [quetiapine fumarate], and Sulfa antibiotics  Review of Systems   Review of Systems  Gastrointestinal: Positive for vomiting.  Genitourinary: Positive for pelvic  pain.  All other systems reviewed and are negative.   Physical Exam Updated Vital Signs BP (!) 172/84 (BP Location: Left Wrist)   Pulse 89   Temp 98.6 F (37 C) (Oral)   Resp 16   LMP  (LMP Unknown)   SpO2 95%   Physical Exam Vitals and nursing note reviewed.  HENT:     Head: Normocephalic.     Mouth/Throat:     Mouth: Mucous membranes are moist.  Eyes:     Extraocular Movements: Extraocular movements intact.  Cardiovascular:     Rate and Rhythm: Normal rate and regular rhythm.     Heart sounds: Normal heart sounds.  Pulmonary:     Effort: Pulmonary effort is normal.     Breath sounds: Normal breath sounds.  Abdominal:     General: Abdomen is flat.     Palpations: Abdomen is soft.     Comments: Mild suprapubic tenderness   Skin:    General: Skin is warm.  Neurological:     General: No focal deficit present.     Mental Status: She is alert.  Psychiatric:        Mood and Affect: Mood normal.        Behavior: Behavior normal.     ED Results / Procedures / Treatments   Labs (all labs ordered are listed, but only abnormal results are displayed) Labs Reviewed  COMPREHENSIVE METABOLIC PANEL - Abnormal; Notable for the following components:      Result Value   Sodium 128 (*)  Chloride 90 (*)    Glucose, Bld 201 (*)    ALT 48 (*)    All other components within normal limits  CBC - Abnormal; Notable for the following components:   RDW 15.7 (*)    All other components within normal limits  LIPASE, BLOOD  URINALYSIS, COMPLETE (UACMP) WITH MICROSCOPIC    EKG None  Radiology No results found.  Procedures Procedures (including critical care time)  Medications Ordered in ED Medications  sodium chloride flush (NS) 0.9 % injection 3 mL (has no administration in time range)  sodium chloride 0.9 % bolus 1,000 mL (has no administration in time range)  ondansetron (ZOFRAN) injection 4 mg (has no administration in time range)  diazepam (VALIUM) injection 5 mg  (has no administration in time range)    ED Course  I have reviewed the triage vital signs and the nursing notes.  Pertinent labs & imaging results that were available during my care of the patient were reviewed by me and considered in my medical decision making (see chart for details).    MDM Rules/Calculators/A&P                      Amy Barnes is a 69 y.o. female here presenting with bladder spasms and possible reaction to [place]  She has a history of UTI so we will get urinalysis and check basic labs.  Will hydrate patient and give some Zofran for symptomatic relief.  I doubt any intra-abdominal pathology.  Patient has no rash or any signs of anaphylaxis.  I told her to stop taking Toviaz for now and follow-up with urology.  8:21 PM Labs showed sodium is 128, given IVF. UA unremarkable. Told her to stop taking Toviaz. She can follow up with urology.   Final Clinical Impression(s) / ED Diagnoses Final diagnoses:  None    Rx / DC Orders ED Discharge Orders    None       Charlynne Pander, MD 10/30/19 2030

## 2019-10-30 NOTE — Discharge Instructions (Addendum)
Stop taking Toviaz.   Take zofran for nausea.   Your sodium is slightly low, recheck with your doctor   See your doctor   Return to ER if you have worse vomiting, abdominal pain, trouble urinating

## 2019-10-30 NOTE — ED Triage Notes (Signed)
Pt to ED via POV stating that she thinks she is having a reaction to a new medication that she started. Pt was started on Tolerodine, pt took the medication for 2 days and started vomiting. Pt states that she then stopped the medication and stopped vomiting. Pt started the medication back and her vomiting started back. Pt is currently in NAD.

## 2019-10-31 ENCOUNTER — Emergency Department (HOSPITAL_BASED_OUTPATIENT_CLINIC_OR_DEPARTMENT_OTHER): Payer: Medicare Other

## 2019-10-31 ENCOUNTER — Emergency Department (HOSPITAL_BASED_OUTPATIENT_CLINIC_OR_DEPARTMENT_OTHER)
Admission: EM | Admit: 2019-10-31 | Discharge: 2019-10-31 | Disposition: A | Discharge status: Home / Self Care | Payer: Medicare Other | Attending: Emergency Medicine | Admitting: Emergency Medicine

## 2019-10-31 ENCOUNTER — Other Ambulatory Visit: Payer: Self-pay

## 2019-10-31 ENCOUNTER — Encounter (HOSPITAL_BASED_OUTPATIENT_CLINIC_OR_DEPARTMENT_OTHER): Payer: Self-pay | Admitting: *Deleted

## 2019-10-31 ENCOUNTER — Telehealth: Payer: Self-pay

## 2019-10-31 DIAGNOSIS — J45909 Unspecified asthma, uncomplicated: Secondary | ICD-10-CM | POA: Diagnosis not present

## 2019-10-31 DIAGNOSIS — Z7982 Long term (current) use of aspirin: Secondary | ICD-10-CM | POA: Insufficient documentation

## 2019-10-31 DIAGNOSIS — Z20822 Contact with and (suspected) exposure to covid-19: Secondary | ICD-10-CM | POA: Diagnosis not present

## 2019-10-31 DIAGNOSIS — I2510 Atherosclerotic heart disease of native coronary artery without angina pectoris: Secondary | ICD-10-CM | POA: Insufficient documentation

## 2019-10-31 DIAGNOSIS — Z79899 Other long term (current) drug therapy: Secondary | ICD-10-CM | POA: Insufficient documentation

## 2019-10-31 DIAGNOSIS — Z87891 Personal history of nicotine dependence: Secondary | ICD-10-CM | POA: Diagnosis not present

## 2019-10-31 DIAGNOSIS — R0789 Other chest pain: Secondary | ICD-10-CM | POA: Diagnosis not present

## 2019-10-31 DIAGNOSIS — E871 Hypo-osmolality and hyponatremia: Secondary | ICD-10-CM | POA: Diagnosis not present

## 2019-10-31 DIAGNOSIS — E119 Type 2 diabetes mellitus without complications: Secondary | ICD-10-CM | POA: Insufficient documentation

## 2019-10-31 DIAGNOSIS — R1110 Vomiting, unspecified: Secondary | ICD-10-CM | POA: Insufficient documentation

## 2019-10-31 DIAGNOSIS — I10 Essential (primary) hypertension: Secondary | ICD-10-CM | POA: Diagnosis not present

## 2019-10-31 DIAGNOSIS — R112 Nausea with vomiting, unspecified: Secondary | ICD-10-CM | POA: Diagnosis not present

## 2019-10-31 LAB — URINALYSIS, ROUTINE W REFLEX MICROSCOPIC
Bilirubin Urine: NEGATIVE
Glucose, UA: NEGATIVE mg/dL
Ketones, ur: NEGATIVE mg/dL
Leukocytes,Ua: NEGATIVE
Nitrite: POSITIVE — AB
Protein, ur: NEGATIVE mg/dL
Specific Gravity, Urine: 1.01 (ref 1.005–1.030)
pH: 7.5 (ref 5.0–8.0)

## 2019-10-31 LAB — COMPREHENSIVE METABOLIC PANEL
ALT: 43 U/L (ref 0–44)
AST: 34 U/L (ref 15–41)
Albumin: 4.4 g/dL (ref 3.5–5.0)
Alkaline Phosphatase: 90 U/L (ref 38–126)
Anion gap: 9 (ref 5–15)
BUN: 13 mg/dL (ref 8–23)
CO2: 25 mmol/L (ref 22–32)
Calcium: 9.6 mg/dL (ref 8.9–10.3)
Chloride: 89 mmol/L — ABNORMAL LOW (ref 98–111)
Creatinine, Ser: 0.64 mg/dL (ref 0.44–1.00)
GFR calc Af Amer: >60 mL/min (ref >60)
GFR calc non Af Amer: >60 mL/min (ref >60)
Glucose, Bld: 211 mg/dL — ABNORMAL HIGH (ref 70–99)
Potassium: 3.6 mmol/L (ref 3.5–5.1)
Sodium: 123 mmol/L — ABNORMAL LOW (ref 135–145)
Total Bilirubin: 0.7 mg/dL (ref 0.3–1.2)
Total Protein: 7.8 g/dL (ref 6.5–8.1)

## 2019-10-31 LAB — URINALYSIS, MICROSCOPIC (REFLEX): WBC, UA: NONE SEEN WBC/hpf (ref 0–5)

## 2019-10-31 LAB — CBC WITH DIFFERENTIAL/PLATELET
Abs Immature Granulocytes: 0.04 10*3/uL (ref 0.00–0.07)
Basophils Absolute: 0 10*3/uL (ref 0.0–0.1)
Basophils Relative: 0 %
Eosinophils Absolute: 0.1 10*3/uL (ref 0.0–0.5)
Eosinophils Relative: 1 %
HCT: 42.4 % (ref 36.0–46.0)
Hemoglobin: 14.2 g/dL (ref 12.0–15.0)
Immature Granulocytes: 1 %
Lymphocytes Relative: 20 %
Lymphs Abs: 1.7 10*3/uL (ref 0.7–4.0)
MCH: 28 pg (ref 26.0–34.0)
MCHC: 33.5 g/dL (ref 30.0–36.0)
MCV: 83.6 fL (ref 80.0–100.0)
Monocytes Absolute: 1.1 10*3/uL — ABNORMAL HIGH (ref 0.1–1.0)
Monocytes Relative: 12 %
Neutro Abs: 5.7 10*3/uL (ref 1.7–7.7)
Neutrophils Relative %: 66 %
Platelets: 279 10*3/uL (ref 150–400)
RBC: 5.07 MIL/uL (ref 3.87–5.11)
RDW: 15.4 % (ref 11.5–15.5)
WBC: 8.7 10*3/uL (ref 4.0–10.5)
nRBC: 0 % (ref 0.0–0.2)

## 2019-10-31 LAB — TROPONIN I (HIGH SENSITIVITY)
Troponin I (High Sensitivity): 25 ng/L — ABNORMAL HIGH (ref <18)
Troponin I (High Sensitivity): 28 ng/L — ABNORMAL HIGH (ref <18)

## 2019-10-31 LAB — LIPASE, BLOOD: Lipase: 24 U/L (ref 11–51)

## 2019-10-31 LAB — SARS CORONAVIRUS 2 AG (30 MIN TAT): SARS Coronavirus 2 Ag: NEGATIVE

## 2019-10-31 MED ORDER — ONDANSETRON 8 MG PO TBDP: ORAL_TABLET | ORAL | 0 refills | Status: DC

## 2019-10-31 MED ORDER — SODIUM CHLORIDE 0.9 % IV BOLUS
1000.0000 mL | Freq: Once | INTRAVENOUS | Status: AC
  Administered 2019-10-31: 1000 mL via INTRAVENOUS

## 2019-10-31 MED ORDER — IOHEXOL 300 MG/ML  SOLN
100.0000 mL | Freq: Once | INTRAMUSCULAR | Status: AC | PRN
  Administered 2019-10-31: 100 mL via INTRAVENOUS

## 2019-10-31 MED ORDER — FENTANYL CITRATE (PF) 100 MCG/2ML IJ SOLN
100.0000 ug | Freq: Once | INTRAMUSCULAR | Status: AC
  Administered 2019-10-31: 50 ug via INTRAVENOUS
  Filled 2019-10-31: qty 2

## 2019-10-31 MED ORDER — PROMETHAZINE HCL 25 MG/ML IJ SOLN
INTRAMUSCULAR | Status: AC
  Filled 2019-10-31: qty 1

## 2019-10-31 MED ORDER — ONDANSETRON HCL 4 MG/2ML IJ SOLN: 4.0000 mg | Freq: Once | INTRAMUSCULAR | Status: DC

## 2019-10-31 MED ORDER — PROMETHAZINE HCL 25 MG/ML IJ SOLN
12.5000 mg | Freq: Once | INTRAMUSCULAR | Status: AC
  Administered 2019-10-31: 12.5 mg via INTRAVENOUS

## 2019-10-31 NOTE — Telephone Encounter (Signed)
Noted. Seen at ER with reassuring evaluation and negative covid test. plz call tomorrow for an update on symptoms.

## 2019-10-31 NOTE — Discharge Instructions (Signed)
Zofran as prescribed as needed for nausea.  Follow-up with your primary doctor later this week for a recheck of your electrolytes, and return to the emergency department if you develop worsening vomiting, high fever, severe abdominal pain, bloody stool or vomit, or other new and concerning symptoms.

## 2019-10-31 NOTE — ED Notes (Signed)
amb with daughter to [facility]

## 2019-10-31 NOTE — ED Provider Notes (Addendum)
[facility] EMERGENCY DEPARTMENT Provider Note   CSN: [id number] Arrival date & time: 10/31/19  1329     History Chief Complaint  Patient presents with  . Emesis    Amy Barnes is a 69 y.o. female.  HPI 69 year old female presents with vomiting and abdominal pain.  Started last week and was presumed to be due to tolterodine tartrate.  This was stopped and her vomiting seem to go away.  Restarted it at the end of last week and then 2 days ago vomiting restarted.  Despite stopping the medicine the vomiting is continuing.  Also having diffuse abdominal pain.  No specific urinary symptoms.  Started with diarrhea couple days ago but this was stopped.  No fevers.  Pain radiates into her chest.  Some shortness of breath.  Has tried multiple antiemetics with no relief.  Was at [facility] last night.  Past Medical History:  Diagnosis Date  . Allergic rhinitis   . Ambulates with cane    straight  . Anemia   . Anxiety   . Asthma    seasonal  . Bipolar affective disorder (HCC)    takes Synthroid meds for Bipolar  . CAD (coronary artery disease) 07/2016   by CT scan  . Carpal tunnel syndrome    had surgery but occasional still has some issues per patient  . Cataract   . Centrilobular emphysema (HCC) 07/2016   by CT scan - pt not aware of this  . Constipation due to pain medication   . Depression with anxiety   . Diabetes mellitus    type 2 - no meds diet controlled  . GERD (gastroesophageal reflux disease)   . History of blood transfusion   . 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 Hyperlipidemia   . Hypertension   . OSA (obstructive sleep apnea)    no longer using cpap, uses a bed that raises and lowers hob  . Osteoarthritis   . Osteoarthritis   . Osteopenia 08/11/2019   DEXA 07/2019 - T -1.1 R femur (osteopenia)  . Pneumonia   . Restless legs   . Septic arthritis (HCC) 10/11/2012  . Shingles 06/30/2016  . Shortness of  breath    with exertion  . Status post revision of total hip replacement bilateral   prosthetic infection R 2013, L 2015  . SVD (spontaneous vaginal delivery)    x 3  . Thoracic aortic atherosclerosis (HCC) 11/207   by CT    Patient Active Problem List   Diagnosis Date Noted  . Diminished pulses in lower extremity 08/11/2019  . Osteopenia 08/11/2019  . Wound infection complicating hardware (HCC) 04/01/2019  . 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 Bilateral lower extremity edema 11/29/2018  . Neurogenic urinary incontinence 11/29/2018  . Urinary incontinence 11/29/2018  . Bladder spasms 11/29/2018  . Discogenic low back pain 11/29/2018  . Chronic sacroiliac joint pain (Bilateral) (R>L) 11/29/2018  . Spondylosis without myelopathy or radiculopathy, lumbar region 11/09/2018  . Chronic low back pain (Bilateral) (L>R) w/o sciatica 11/09/2018  . History of hip replacement (Left) 10/13/2018  . History of back surgery 10/13/2018  . Vitamin D insufficiency 10/13/2018  . Osteoarthritis of facet joint of lumbar spine 10/13/2018  . Osteoarthritis involving multiple joints 10/13/2018  . Cervical radiculopathy (Left) 10/13/2018  . Lumbar facet arthropathy (Bilateral) 10/13/2018  . Lumbar facet syndrome (Bilateral) (L>R) 10/13/2018  . Abnormal MRI, lumbar spine (09/16/2018)  10/13/2018  . Lumbar nerve root compression (L4) (Left) 10/13/2018  . Lumbar foraminal stenosis 10/13/2018  . 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 Chronic low back pain (Primary Area of Pain) (Bilateral) (L>R) w/ sciatica (Left) 09/14/2018  . Chronic lower extremity pain (Secondary Area of Pain) (Left) 09/14/2018  . Chronic hip pain [facility] Area of Pain) (Bilateral) (L>R) 09/14/2018  . Chronic knee pain (Fourth Area of Pain) (Bilateral) (R>L) 09/14/2018  . Pharmacologic therapy 09/14/2018  . Disorder of skeletal  system 09/14/2018  . Problems influencing health status 09/14/2018  . Vaginal itching 07/12/2018  . Primary osteoarthritis of first carpometacarpal joint of left hand 04/20/2018  . 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 01/16/2018  . PAH (pulmonary artery hypertension) (HCC) 08/03/2017  . Advanced care planning/counseling discussion 06/25/2017  . Synovial cyst of lumbar facet joint 11/13/2016  . Thoracic aortic atherosclerosis (HCC)   . CAD (coronary artery disease) 07/30/2016  . Centrilobular emphysema (HCC) 07/30/2016  . Ex-smoker 07/08/2016  . Nonspecific abnormal electrocardiogram (ECG) (EKG) 07/08/2016  . Chronic lumbar radicular pain (L4) (Bilateral) 06/09/2016  . DOE (dyspnea on exertion) 05/22/2016  . Degenerative joint disease (DJD) of hip 04/17/2016  . Somatic dysfunction of sacroiliac joint 04/17/2016  . S/P Lumbar Fusion (L3-4 PLIF) 04/17/2016  . Cervical fusion syndrome 04/17/2016  . Pedal edema 02/19/2016  . Chronic pain syndrome 02/19/2016  . Abdominal aortic atherosclerosis (HCC) 12/11/2015  . MRSA (methicillin resistant Staphylococcus aureus) infection 10/11/2012  . Urinary urgency 07/28/2012  . Infection or inflammatory reaction due to internal joint prosthesis (HCC) 12/03/2011  . Morbid obesity with BMI of 60.0-69.9, adult (HCC) 05/13/2011  . Essential hypertension 05/17/2010  . HIP PAIN 12/25/2009  . Cervical radiculopathy (Right) 12/25/2009  . Benign positional vertigo 07/21/2008  . Hyperlipidemia associated with type 2 diabetes mellitus (HCC) 08/16/2007  . Hypothyroidism 07/30/2007  . DM type 2 with diabetic peripheral neuropathy (HCC) 07/30/2007  . ANEMIA-NOS 07/30/2007  . Bipolar disorder (HCC) 07/30/2007  . Allergic rhinitis 07/30/2007  . Asthma 07/30/2007  . GERD 07/30/2007  . Osteoarthritis 07/30/2007  . OSA on CPAP 07/30/2007    Past Surgical History:  Procedure Laterality Date  . BREAST CYST ASPIRATION    . CARPAL TUNNEL RELEASE Right  05/15/2011  . CARPAL TUNNEL RELEASE Left 12/24/2017   Procedure: LEFT CARPAL TUNNEL RELEASE;  Surgeon: [name], MD;  Location: [facility];  Service: Orthopedics;  Laterality: Left;  . CERVICAL FUSION  2012   C2/3/4  . COLONOSCOPY WITH PROPOFOL N/A 12/31/2015   diverticulosis, int hem, o/w normal rpt 10 yrs (Re[place])  . EYE SURGERY Bilateral    cataract surgery with lens implant  . FOOT SURGERY  1982   bone spur  . I & D EXTREMITY  09/06/2012   Budd Palmer, MD; Right;  I&D of right thigh  . I & D EXTREMITY Left 07/2013   wound vac - daily doxycycline indefinitely  . KNEE ARTHROSCOPY  09/01/2012   Dannielle Huh, MD;  Right  . LUMBAR FUSION  01/08/2012   L3-4  . LUMBAR FUSION  02/2019   unexpectedly discovered MRSA infection - pus Lovell Sheehan)   . LUMBAR LAMINECTOMY/DECOMPRESSION MICRODISCECTOMY N/A 11/13/2016   LAMINOTOMY/LAMINECTOMY LUMBAR FOUR LUMBAR FIVE  WITH RESECTION OF SYNOVIAL CYST;  Surgeon: Tressie Stalker, MD  . NECK SURGERY     Herniated disk C2,3,4  . NOSE SURGERY    . PARTIAL HYSTERECTOMY  1984  for mennorhagia, ovaries remain  . REVISION TOTAL HIP ARTHROPLASTY Left 08/28/2011  . TMJ ARTHROPLASTY  1982  . TOTAL HIP ARTHROPLASTY Left 2002  . TRIGGER FINGER RELEASE Right 05/15/2011   long finger     OB History   No obstetric history on file.     Family History  Problem Relation Age of Onset  . Aneurysm Father 62       brain  . Alcohol abuse Father   . Cancer Father        possibly  . CAD Other        several siblings  . Cancer Brother        prostate  . Diabetes Brother   . Diabetes Sister   . Anesthesia problems Neg Hx   . Hypotension Neg Hx   . Malignant hyperthermia Neg Hx   . Pseudochol deficiency Neg Hx   . Breast cancer Neg Hx     Social History   Tobacco Use  . Smoking status: Former Smoker    Packs/day: 1.50    Years: 40.00    Pack years: 60.00    Types: Cigarettes    Quit date: 04/29/2009    Years since quitting: 10.5    . Smokeless tobacco: Never Used  Substance Use Topics  . Alcohol use: Yes    Alcohol/week: 21.0 standard drinks    Types: 21 Shots of liquor per week    Comment: 3 shots/per night  . Drug use: No    Home Medications Prior to Admission medications   Medication Sig Start Date End Date Taking? Authorizing Provider  albuterol (PROVENTIL HFA;VENTOLIN HFA) 108 (90 Base) MCG/ACT inhaler Inhale 2 puffs into the lungs every 6 (six) hours as needed for wheezing or shortness of breath. 12/14/18   [name], MD  amLODipine (NORVASC) 5 MG tablet Take 1 tablet (5 mg total) by mouth daily. 10/17/19   [name], MD  aspirin 81 MG EC tablet Take 81 mg by mouth at bedtime.     [provider]  celecoxib (CELEBREX) 200 MG capsule TAKE ONE CAPSULE BY MOUTH DAILY 06/14/19   [name], MD  Cholecalciferol (VITAMIN D3) 25 MCG (1000 UT) CAPS Take 1 capsule (1,000 Units total) by mouth daily. 08/11/19   [name], MD  cyclobenzaprine (FLEXERIL) 10 MG tablet TAKE 1 TABLET BY MOUTH 3 TIMES EVERY DAY AS NEEDED FOR MUSCLE SPASMS 05/29/19   [provider]  doxycycline (VIBRA-TABS) 100 MG tablet Take 100 mg by mouth 2 (two) times daily. 07/31/19   [provider]  fluconazole (DIFLUCAN) 200 MG tablet Take 2 tablets (400 mg total) by mouth daily. 10/04/19   Judyann Munson, MD  FLUoxetine (PROZAC) 40 MG capsule Take by mouth daily. 03/08/19   [provider]  Fluticasone-Salmeterol (ADVAIR) 250-50 MCG/DOSE AEPB Inhale 2 puffs into the lungs at bedtime.     [provider]  furosemide (LASIX) 40 MG tablet Take 40 mg by mouth daily.  02/03/17   [name], MD  lamoTRIgine (LAMICTAL) 200 MG tablet Take 200 mg by mouth daily.      [provider]  levothyroxine (SYNTHROID) 150 MCG tablet Take 1 tablet (150 mcg total) by mouth at bedtime. 01/21/19   [name], MD  losartan (COZAAR) 100 MG tablet TAKE 1 TABLET BY MOUTH EVERY DAY 09/05/19    [name], MD  methocarbamol (ROBAXIN-750) 750 MG tablet Take 1 tablet (750 mg total) by mouth every 6 (six) hours as needed for muscle  spasms. 03/25/19   Bergman, Lindie Spruce D, NP  montelukast (SINGULAIR) 10 MG tablet TAKE 1 TABLET BY MOUTH EVERYDAY AT BEDTIME Patient taking differently: Take 10 mg by mouth at bedtime.  02/28/19   [name], MD  Multiple Vitamin (MULTIVITAMIN WITH MINERALS) TABS tablet Take 2 tablets by mouth daily.     [provider]  nystatin (MYCOSTATIN/NYSTOP) powder Apply topically 4 (four) times daily. 02/22/19   Reva Bores, MD  ondansetron (ZOFRAN ODT) 4 MG disintegrating tablet 4mg  ODT q4 hours prn nausea/vomit 10/30/19   11/01/19, MD  ondansetron (ZOFRAN) 4 MG tablet Take 1 tablet (4 mg total) by mouth 2 (two) times daily. 05/19/19   05/21/19, MD  ondansetron (ZOFRAN) 8 MG tablet Take 8 mg by mouth every 8 (eight) hours as needed for nausea or vomiting.     [provider]  Oxcarbazepine (TRILEPTAL) 300 MG tablet Take 300-600 mg by mouth See admin instructions. Take 300 mg by mouth in the morning and 600 mg at night    [provider]  oxyCODONE 10 MG TABS Take 1 tablet (10 mg total) by mouth every 4 (four) hours as needed for severe pain ((score 7 to 10)). 03/25/19   03/27/19 D, NP  pantoprazole (PROTONIX) 40 MG tablet Take 1 tablet (40 mg total) by mouth daily. 01/21/19   01/23/19, MD  promethazine (PHENERGAN) 25 MG tablet Take 25 mg by mouth every 6 (six) hours as needed for nausea or vomiting.    [provider]  traMADol (ULTRAM) 50 MG tablet Take 50-100 mg by mouth every 6 (six) hours as needed. 04/27/19   [provider]    Allergies    Cephalexin, Hydrocodone, Tolterodine tartrate, Risperidone and related, Seroquel [quetiapine fumarate], and Sulfa antibiotics  Review of Systems   Review of Systems  Constitutional: Negative for fever.  Respiratory: Positive for shortness of  breath.   Cardiovascular: Positive for chest pain.  Gastrointestinal: Positive for abdominal pain, diarrhea, nausea and vomiting.  Genitourinary: Negative for dysuria.  Musculoskeletal: Negative for back pain.  All other systems reviewed and are negative.   Physical Exam Updated Vital Signs BP (!) 192/101   Pulse 83   Temp 99.9 F (37.7 C) (Rectal)   Resp 16   Ht 5\' 4"  (1.626 m)   Wt (!) 167.4 kg   LMP  (LMP Unknown)   SpO2 97%   BMI 63.34 kg/m   Physical Exam Vitals and nursing note reviewed.  Constitutional:      Appearance: She is well-developed. She is obese.  HENT:     Head: Normocephalic and atraumatic.     Right Ear: External ear normal.     Left Ear: External ear normal.     Nose: Nose normal.  Eyes:     General:        Right eye: No discharge.        Left eye: No discharge.  Cardiovascular:     Rate and Rhythm: Normal rate and regular rhythm.     Heart sounds: Normal heart sounds.  Pulmonary:     Effort: Pulmonary effort is normal.     Breath sounds: Normal breath sounds.  Abdominal:     Palpations: Abdomen is soft.     Tenderness: There is generalized abdominal tenderness. There is no right CVA tenderness or left CVA tenderness.  Skin:    General: Skin is warm and dry.  Neurological:     Mental Status: She is  alert.  Psychiatric:        Mood and Affect: Mood is not anxious.     ED Results / Procedures / Treatments   Labs (all labs ordered are listed, but only abnormal results are displayed) Labs Reviewed  CBC WITH DIFFERENTIAL/PLATELET - Abnormal; Notable for the following components:      Result Value   Monocytes Absolute 1.1 (*)    All other components within normal limits  COMPREHENSIVE METABOLIC PANEL - Abnormal; Notable for the following components:   Sodium 123 (*)    Chloride 89 (*)    Glucose, Bld 211 (*)    All other components within normal limits  TROPONIN I (HIGH SENSITIVITY) - Abnormal; Notable for the following components:    Troponin I (High Sensitivity) 25 (*)    All other components within normal limits  SARS CORONAVIRUS 2 AG (30 MIN TAT)  LIPASE, BLOOD  URINALYSIS, ROUTINE W REFLEX MICROSCOPIC  TROPONIN I (HIGH SENSITIVITY)    EKG EKG Interpretation  Date/Time:  Monday October 31 2019 14:07:22 EST Ventricular Rate:  88 PR Interval:    QRS Duration: 141 QT Interval:  436 QTC Calculation: 528 R Axis:   -55 Text Interpretation: Sinus rhythm Atrial premature complexes Probable left atrial enlargement Left bundle branch block no significant change since June 2020 Confirmed by Sherwood Gambler [id number]) on 10/31/2019 2:12:18 PM   Radiology CT ABDOMEN PELVIS W CONTRAST  Result Date: 10/31/2019 CLINICAL DATA:  Nausea and vomiting and abdominal cramping for 3 days. EXAM: CT ABDOMEN AND PELVIS WITH CONTRAST TECHNIQUE: Multidetector CT imaging of the abdomen and pelvis was performed using the standard protocol following bolus administration of intravenous contrast. CONTRAST:  125mL OMNIPAQUE IOHEXOL 300 MG/ML  SOLN COMPARISON:  CT scan dated 11/27/2015 FINDINGS: Lower chest: Extensive chronic stable calcification in the heart. Hepatobiliary: No focal liver abnormality is seen. No gallstones, gallbladder wall thickening, or biliary dilatation. Pancreas: Unremarkable. No pancreatic ductal dilatation or surrounding inflammatory changes. Spleen: Normal in size without focal abnormality. Adrenals/Urinary Tract: Tiny stone in the mid right kidney. The kidneys are otherwise normal. No hydronephrosis. The visualized portion of the bladder is normal. Left hip prosthesis obscures some detail of the bladder. Stomach/Bowel: Stomach is within normal limits. Appendix appears normal. No evidence of bowel wall thickening, distention, or inflammatory changes. Vascular/Lymphatic: Aortic atherosclerosis. No enlarged abdominal or pelvic lymph nodes. Reproductive: Status post hysterectomy. No significant adnexal masses. Small cysts on each ovary.  Other: 3 cm periumbilical hernia containing only fat, unchanged. No ascites. No free air. Musculoskeletal: No acute abnormality. Left total hip prosthesis. Lower lumbar fusion. IMPRESSION: 1. No acute abnormality of the abdomen or pelvis. 2. Tiny stone in the mid right kidney. Electronically Signed   By: Lorriane Shire M.D.   On: 10/31/2019 15:23   DG Chest Portable 1 View  Result Date: 10/31/2019 CLINICAL DATA:  Nausea, vomiting, and abdominal cramping for the past 3 days. EXAM: PORTABLE CHEST 1 VIEW COMPARISON:  CT chest dated August 29, 2019. Chest x-ray dated April 26, 2019. FINDINGS: The heart size and mediastinal contours are within normal limits. Normal pulmonary vascularity. No focal consolidation, pleural effusion, or pneumothorax. Unchanged elevation of the right hemidiaphragm. No acute osseous abnormality. IMPRESSION: No active disease. Electronically Signed   By: Titus Dubin M.D.   On: 10/31/2019 14:34    Procedures Procedures (including critical care time)  Medications Ordered in ED Medications  promethazine (PHENERGAN) 25 MG/ML injection (has no administration in time range)  sodium chloride 0.9 %  bolus 1,000 mL (0 mLs Intravenous Stopped 10/31/19 1522)  fentaNYL (SUBLIMAZE) injection 100 mcg (50 mcg Intravenous Given 10/31/19 1400)  promethazine (PHENERGAN) injection 12.5 mg (12.5 mg Intravenous Given 10/31/19 1400)  iohexol (OMNIPAQUE) 300 MG/ML solution 100 mL (100 mLs Intravenous Contrast Given 10/31/19 1440)  sodium chloride 0.9 % bolus 1,000 mL (1,000 mLs Intravenous New Bag/Given 10/31/19 1533)    ED Course  I have reviewed the triage vital signs and the nursing notes.  Pertinent labs & imaging results that were available during my care of the patient were reviewed by me and considered in my medical decision making (see chart for details).    MDM Rules/Calculators/A&P                      Patient is feeling much better after fentanyl and Phenergan.  Tolerating p.o.  CT  without acute abnormality.  Labs are reassuring save for a hyponatremia.  She is not symptomatic from this perspective.  This appears to be acute given the vomiting just started and is different from yesterday.  I think she can be given IV fluids and if she is able to tolerate p.o. she can go home.  We discussed with patient and daughter who are in agreement.  However we discussed low threshold for admission and return to the ED.  They have antiemetics at home.  Troponin was sent and is equivocal at around 25, if repeat is negative I think this is likely secondary to vomiting and hypertension.  Doubt ACS.  If it is rising she will need admission.  Covid AG testing sent by nurse but presentation not really c/w covid. Care to Dr. Judd Lien.  Amy Barnes was evaluated in Emergency Department on 10/31/2019 for the symptoms described in the history of present illness. She was evaluated in the context of the global COVID-19 pandemic, which necessitated consideration that the patient might be at risk for infection with the SARS-CoV-2 virus that causes COVID-19. Institutional protocols and algorithms that pertain to the evaluation of patients at risk for COVID-19 are in a state of rapid change based on information released by regulatory bodies including the CDC and federal and state organizations. These policies and algorithms were followed during the patient's care in the ED.  Final Clinical Impression(s) / ED Diagnoses Final diagnoses:  Vomiting in adult  Hyponatremia    Rx / DC Orders ED Discharge Orders    None       Pricilla Loveless, MD 10/31/19 1541    Pricilla Loveless, MD 10/31/19 [phone]

## 2019-10-31 NOTE — Telephone Encounter (Signed)
[facility] Night - Client TELEPHONE ADVICE RECORD AccessNurse Patient Name: MIRTHA Alvizo Gender: Female DOB: May 03, 1951 Age: 69 Y 4 M 22 D Return Phone Number: [phone] (Primary) [place] [address] Client [facility] Night - Client Client Site [facility] - Night Physician [name] - MD Contact Type Call Who Is Calling Patient / Member / Family / Caregiver Call Type Triage / Clinical Caller Name [name] Relationship To Patient Daughter Return Phone Number [phone] (Primary) Chief Complaint Vomiting Reason for Call Symptomatic / Request for Health Information Initial Comment Caller states her mother has been taking a new medication that may be causing her to vomit. Translation No Nurse Assessment Nurse: Marti Sleigh, RN, Mycah Date/Time Lamount Cohen Time): 10/30/2019 3:29:46 PM Confirm and document reason for call. If symptomatic, describe symptoms. ---Caller states her mother has been taking a new medication that may be causing her to vomit. Tolterodine started this past week and she has not stopped vomiting since taking it. Major cramping pains and muscle spasms as well. Not giving medication to mother now. Has not kept anything down in 36 hours now. No fever, some diarrhea though. spasms in stomach and bladder. Has the patient had close contact with a person known or suspected to have the novel coronavirus illness OR traveled / lives in area with major community spread (including international travel) in the last 14 days from the onset of symptoms? * If Asymptomatic, screen for exposure and travel within the last 14 days. ---No Does the patient have any new or worsening symptoms? ---Yes Will a triage be completed? ---Yes Related visit to physician within the last 2 weeks? ---Yes Does the PT have any chronic conditions? (i.e. diabetes, asthma, this includes High risk factors  for pregnancy, etc.) ---Yes List chronic conditions. ---diabetes, hypertension, bipolar Is this a behavioral health or substance abuse call? ---No Guidelines Guideline Title Affirmed Question Affirmed Notes Nurse Date/Time (Eastern Time) Vomiting [1] SEVERE vomiting (e.g., 6 or more times/ day) AND [2] present > 8 [place], RN, Mycah 10/30/2019 3:32:19 PM PLEASE NOTE: All timestamps contained within this report are represented as [place] Standard Time. CONFIDENTIALTY NOTICE: This fax transmission is intended only for the addressee. It contains information that is legally privileged, confidential or otherwise protected from use or disclosure. If you are not the intended recipient, you are strictly prohibited from reviewing, disclosing, copying using or disseminating any of this information or taking any action in reliance on or regarding this information. If you have received this fax in error, please notify us immediately by telephone so that we can arrange for its return to [place]. Phone: [phone], Toll-Free: [phone], Fax: [phone] Page: 2 of 2 Call Id: [id number] Guidelines Guideline Title Affirmed Question Affirmed Notes Nurse Date/Time Lamount Cohen Time) hours (Exception: patient sounds well, is drinking liquids, does not sound dehydrated, and vomiting has lasted less than 24 hours) Disp. Time Lamount Cohen Time) Disposition Final User 10/30/2019 3:34:47 PM Go to ED Now (or PCP triage) Yes Marti Sleigh, RN, Mycah Caller Disagree/Comply Comply Caller Understands Yes PreDisposition Go to ED Care Advice Given Per Guideline GO TO ED NOW (OR PCP TRIAGE): * IF NO PCP (PRIMARY CARE PROVIDER) SECOND-LEVEL TRIAGE: You need to be seen within the next hour. Go to the ED/UCC at _____________ Hospital. Leave as soon as you can. BRING A BUCKET IN CASE OF VOMITING : * You may wish to bring a bucket, pan, or sack with you in case there is more  vomiting during the drive. BRING MEDICINES: * Please  bring a list of your current medicines when you go to see the doctor. * It is also a good idea to bring the pill bottles too. This will help the doctor to make certain you are taking the right medicines and the right dose. CARE ADVICE per Vomiting (Adult) guideline. Comments User: Regan Rakers, RN Date/Time Eilene Ghazi Time): 10/30/2019 3:32:36 PM has given zofran and pherergan through weekend to help and not working User: Regan Rakers, RN Date/Time Eilene Ghazi Time): 10/30/2019 3:33:06 PM Was very weak last night. Uses 2 canes to walk to begin with but was shaking very badly. Can ambulate to bathroom today though. User: Regan Rakers, RN Date/Time Eilene Ghazi Time): 10/30/2019 3:34:28 PM Has tried [employer] all week as well. User: Regan Rakers, RN Date/Time Eilene Ghazi Time): 10/30/2019 3:37:04 PM Going to ED now. Referrals [facility] - ED

## 2019-10-31 NOTE — Telephone Encounter (Signed)
Pt has already called in and given advise per Dr Reece Agar thru Carollee Herter CMA.

## 2019-10-31 NOTE — ED Triage Notes (Signed)
Pt c/o n/v and abd cramping x 3 days, seen last night at [facility] ED for same , no improvement

## 2019-10-31 NOTE — ED Provider Notes (Signed)
Care assumed from Dr. Criss Alvine at shift change.  Patient awaiting results of a second troponin and IV fluid administration.  Patient appears to be tolerating p.o. and states that she feels much better.  Her second troponin has returned negative.  At this point, patient comfortable with going home.  Her sodium was 123 and I advised him to follow this up later this week with their primary doctor to have it rechecked.  She understands to return to the ER in the meantime if her symptoms worsen or change.   Geoffery Lyons, MD 10/31/19 1719

## 2019-10-31 NOTE — Telephone Encounter (Signed)
Pt went to the ER yesterday for vomiting. Says she only received 1/2 bag of fluids. Urine Culture Pending. Still vomiting even with Zofran or phenergan. Spoke with Dr Reece Agar and he advised she go to an UC because we cannot bring her in the office. I gave her the number to the new [facility] in [place]. She will call them at 10 when they open.

## 2019-10-31 NOTE — Telephone Encounter (Signed)
Patient's daughter called stating she tried the Tolterodine last week and it caused severe vomiting. Patient had to go to the ER Sunday due to vomiting. This was noted in her chart. She would like to know if they can try the oxybutinin 10mg  as discussed at the last visit

## 2019-11-01 LAB — URINE CULTURE: Culture: >=100000 — AB

## 2019-11-01 NOTE — Telephone Encounter (Signed)
Lvm asking pt to call back.  Need to get update on pt's sxs.  

## 2019-11-02 MED ORDER — ONDANSETRON 8 MG PO TBDP: 8.0000 mg | ORAL_TABLET | Freq: Two times a day (BID) | ORAL | 0 refills | Status: DC | PRN

## 2019-11-02 NOTE — Addendum Note (Signed)
Addended by: [name] on: 11/02/2019 11:21 AM   Modules accepted: Orders

## 2019-11-02 NOTE — Progress Notes (Signed)
Brief Pharmacy Note  Pharmacist reviewed ED Culture Report. Patient with K.oxytoca (ESBL) in urine culture. Patient without urinary symptoms, UA unremarkable. Patient in for N/V suspected to be related to recent start of tolterodine. Discussed with EDP [place]. No need for antibiotics at this time.  Laureen Ochs, PharmD

## 2019-11-02 NOTE — Telephone Encounter (Signed)
Per Toni Amend, pt scheduled tomorrow at 12:15.

## 2019-11-02 NOTE — Telephone Encounter (Signed)
Lvm for pt's daughter, [name] (on dpr), asking her to call back.  Need to relay Dr. Timoteo Expose message and schedule OV via backdoor.

## 2019-11-02 NOTE — Telephone Encounter (Signed)
zofran ODT refilled. plz schedule in office evaluation for vomiting.  Had negative covid test.  Come in through the back.

## 2019-11-02 NOTE — Telephone Encounter (Signed)
Daughter called back to say she had not vomited since going to the ER the 2nd time. She made it until last night before vomiting again. She had started eating chicken broth. Has 1 8mg  zofran left. She is unable to hold anything down, again. Please advise.

## 2019-11-02 NOTE — Telephone Encounter (Signed)
Spoke with patient and husband.  She was able to tolerate sips of ginger ale and gatorade today without vomiting.  Discussed low sodium in ER and warning signs to need to return to ER - HA, n/v, confusion, seizure - due to danger of acute hyponatremia. If any worsening, they will return to ER tonight.

## 2019-11-03 ENCOUNTER — Encounter: Payer: Self-pay | Admitting: Family Medicine

## 2019-11-03 ENCOUNTER — Ambulatory Visit: Payer: Medicare Other | Admitting: Family Medicine

## 2019-11-03 ENCOUNTER — Other Ambulatory Visit: Payer: Self-pay

## 2019-11-03 ENCOUNTER — Ambulatory Visit (INDEPENDENT_AMBULATORY_CARE_PROVIDER_SITE_OTHER): Payer: Medicare Other | Admitting: Family Medicine

## 2019-11-03 VITALS — BP 112/70 | HR 79 | Temp 97.5°F | Ht 64.0 in | Wt 360.2 lb

## 2019-11-03 DIAGNOSIS — Z6841 Body Mass Index (BMI) 40.0 and over, adult: Secondary | ICD-10-CM | POA: Diagnosis not present

## 2019-11-03 DIAGNOSIS — I10 Essential (primary) hypertension: Secondary | ICD-10-CM | POA: Diagnosis not present

## 2019-11-03 DIAGNOSIS — R1013 Epigastric pain: Secondary | ICD-10-CM | POA: Diagnosis not present

## 2019-11-03 DIAGNOSIS — E871 Hypo-osmolality and hyponatremia: Secondary | ICD-10-CM | POA: Insufficient documentation

## 2019-11-03 DIAGNOSIS — F3170 Bipolar disorder, currently in remission, most recent episode unspecified: Secondary | ICD-10-CM

## 2019-11-03 DIAGNOSIS — K219 Gastro-esophageal reflux disease without esophagitis: Secondary | ICD-10-CM | POA: Diagnosis not present

## 2019-11-03 DIAGNOSIS — R1084 Generalized abdominal pain: Secondary | ICD-10-CM | POA: Insufficient documentation

## 2019-11-03 DIAGNOSIS — E039 Hypothyroidism, unspecified: Secondary | ICD-10-CM

## 2019-11-03 DIAGNOSIS — R3915 Urgency of urination: Secondary | ICD-10-CM | POA: Diagnosis not present

## 2019-11-03 DIAGNOSIS — R112 Nausea with vomiting, unspecified: Secondary | ICD-10-CM | POA: Diagnosis not present

## 2019-11-03 LAB — BASIC METABOLIC PANEL
BUN: 16 mg/dL (ref 6–23)
CO2: 28 mEq/L (ref 19–32)
Calcium: 9.2 mg/dL (ref 8.4–10.5)
Chloride: 90 mEq/L — ABNORMAL LOW (ref 96–112)
Creatinine, Ser: 0.94 mg/dL (ref 0.40–1.20)
GFR: 59.32 mL/min — ABNORMAL LOW (ref >60.00)
Glucose, Bld: 181 mg/dL — ABNORMAL HIGH (ref 70–99)
Potassium: 3.5 mEq/L (ref 3.5–5.1)
Sodium: 126 mEq/L — ABNORMAL LOW (ref 135–145)

## 2019-11-03 MED ORDER — FLUTICASONE-SALMETEROL 250-50 MCG/DOSE IN AEPB: 2.0000 | INHALATION_SPRAY | Freq: Every day | RESPIRATORY_TRACT | 11 refills | Status: DC

## 2019-11-03 MED ORDER — OXYBUTYNIN CHLORIDE ER 10 MG PO TB24: 10.0000 mg | ORAL_TABLET | Freq: Every day | ORAL | 11 refills | Status: DC

## 2019-11-03 MED ORDER — SUCRALFATE 1 G PO TABS: 1.0000 g | ORAL_TABLET | Freq: Three times a day (TID) | ORAL | 0 refills | Status: DC

## 2019-11-03 NOTE — Assessment & Plan Note (Deleted)
Will stay off Amy Barnes which could have slowed stomach emptying and precipitated recent nausea/vomiting issues - and likely all antimuscarinics.

## 2019-11-03 NOTE — Assessment & Plan Note (Signed)
Anticipate tolterodine Amy Barnes) led to decreased GI motility and subsequent nausea/vomiting which may have precipitated gastritis/esophagitis or PUD.  Will increase PPI frequency, start carafate, refer urgently to GI, ER precautions reviewed in interim.  Discussed zofran/phenergan use.

## 2019-11-03 NOTE — Assessment & Plan Note (Signed)
BP stable today.

## 2019-11-03 NOTE — Assessment & Plan Note (Signed)
New - likely related to vomiting. Last emesis was 2 nights ago. Update level today. If lower, will need to return to ER. Pt/daughter aware.

## 2019-11-03 NOTE — Patient Instructions (Signed)
Labs today Increase protonix to 40mg  twice daily Start carafate tablets 4 times a day with meals.  We will refer you to GI for further evaluation If worsening, back to the ER.

## 2019-11-03 NOTE — Assessment & Plan Note (Signed)
She is on both trilpetal and lamictal - which could both contribute to GI symptoms - however these are longterm meds. Watch trileptal and sodium levels

## 2019-11-03 NOTE — Assessment & Plan Note (Signed)
Will stay off toviaz which could have slowed stomach emptying and precipitated recent nausea/vomiting issues - and likely all antimuscarinics.  

## 2019-11-03 NOTE — Progress Notes (Signed)
Daughter requests phone call with lab results: [phone] This visit was conducted in person.  BP 112/70 (BP Location: Right Arm, Patient Position: Sitting, Cuff Size: Large) Comment (Cuff Size): Thigh cuff  Pulse 79   Temp (!) 97.5 F (36.4 C) (Temporal)   Ht '5\' 4"'$  (1.626 m)   Wt (!) 360 lb 4 oz (163.4 kg)   LMP  (LMP Unknown)   SpO2 96%   BMI 61.84 kg/m    CC: ER f/u visit Subjective:    Patient ID: Amy Barnes, female    DOB: 02-17-1951, 69 y.o.   MRN: [id number]  HPI: Amy Barnes is a 69 y.o. female presenting on 11/03/2019 for Hospitalization Follow-up (Seen at [facility] ED on 10/30/19 and at [facility] ED on 10/31/19.   Pt accompanied by daughter, [name]- temp 97.8.)   Recent ER visits (1/31 then 2/1) for nausea/vomiting attributed to recent toviaz start by urology. Was previously on oxybutynin XL '10mg'$  nightly. Records reviewed. On first ER visit Na down to 128, treated with IVF 500cc NS and zofran. Returned the next day with persistent nausea/vomiting and new abdominal pain, hypertensive, Na down to 123 other labs normal, CT abd/pelvis and CXR reassuring (chronically elevated R hemidiaphragm). TnI 23, 25. Treated with phenergan and fentanyl and IVF. Covid test negative.   Staying weak and nauseated. Last vomiting episode 2 nights ago. Burning epigastric abd pain. Alternating zofran and phenergan. Since GI symptoms, celebrex, aspirin on hold. She continues pantoprazole '40mg'$  daily. Increasing choking sensation noted when swallowing over the past month. No ST or odynophagia.  35 lb weight loss in a month.  No fevers/chills, lower abd pain, diarrhea or constipation. No blood in emesis.  She tolerates gatorade, ginger ale, pedialyte popsicles better than chicken broth.   Saw ID Baxter Flattery) 10/04/2019 - treated for thrush with 3 wk diflucan '400mg'$  course. Finished course 10/30/2019. Continues doxy '100mg'$  bid. Planned f/u with ID next week.   H/o bile gastritis on EGD 2013 - this feel similar.    Anticipate tolterodine (Toviaz) started at end of 09/2019 caused GI symptoms and nausea/vomiting led to acute hyponatremia.   No prior abdominal surgeries besides partial hysterectomy 1984.      Relevant past medical, surgical, family and social history reviewed and updated as indicated. Interim medical history since our last visit reviewed. Allergies and medications reviewed and updated. Outpatient Medications Prior to Visit  Medication Sig Dispense Refill  . albuterol (PROVENTIL HFA;VENTOLIN HFA) 108 (90 Base) MCG/ACT inhaler Inhale 2 puffs into the lungs every 6 (six) hours as needed for wheezing or shortness of breath. 1 Inhaler 6  . amLODipine (NORVASC) 5 MG tablet Take 1 tablet (5 mg total) by mouth daily. 90 tablet 0  . aspirin 81 MG EC tablet Take 81 mg by mouth at bedtime.     . celecoxib (CELEBREX) 200 MG capsule TAKE ONE CAPSULE BY MOUTH DAILY 90 capsule 1  . Cholecalciferol (VITAMIN D3) 25 MCG (1000 UT) CAPS Take 1 capsule (1,000 Units total) by mouth daily. 30 capsule   . cyclobenzaprine (FLEXERIL) 10 MG tablet TAKE 1 TABLET BY MOUTH 3 TIMES EVERY DAY AS NEEDED FOR MUSCLE SPASMS    . doxycycline (VIBRA-TABS) 100 MG tablet Take 100 mg by mouth 2 (two) times daily.    Marland Kitchen FLUoxetine (PROZAC) 40 MG capsule Take by mouth daily.    . furosemide (LASIX) 40 MG tablet Take 1 tablet (40 mg total) by mouth daily as needed for fluid.    Marland Kitchen  lamoTRIgine (LAMICTAL) 200 MG tablet Take 200 mg by mouth daily.      Marland Kitchen levothyroxine (SYNTHROID) 150 MCG tablet Take 1 tablet (150 mcg total) by mouth at bedtime. 90 tablet 3  . losartan (COZAAR) 100 MG tablet TAKE 1 TABLET BY MOUTH EVERY DAY 90 tablet 1  . methocarbamol (ROBAXIN-750) 750 MG tablet Take 1 tablet (750 mg total) by mouth every 6 (six) hours as needed for muscle spasms. 28 tablet 0  . montelukast (SINGULAIR) 10 MG tablet TAKE 1 TABLET BY MOUTH EVERYDAY AT BEDTIME (Patient taking differently: Take 10 mg by mouth at bedtime. ) 90 tablet 2  .  Multiple Vitamin (MULTIVITAMIN WITH MINERALS) TABS tablet Take 2 tablets by mouth daily.     Marland Kitchen nystatin (MYCOSTATIN/NYSTOP) powder Apply topically 4 (four) times daily. 60 g 3  . ondansetron (ZOFRAN ODT) 8 MG disintegrating tablet Take 1 tablet (8 mg total) by mouth 2 (two) times daily as needed for nausea or vomiting. 10 tablet 0  . ondansetron (ZOFRAN) 4 MG tablet Take 1 tablet (4 mg total) by mouth 2 (two) times daily. 20 tablet 3  . Oxcarbazepine (TRILEPTAL) 300 MG tablet Take 300-600 mg by mouth See admin instructions. Take 300 mg by mouth in the morning and 600 mg at night    . oxyCODONE 10 MG TABS Take 1 tablet (10 mg total) by mouth every 4 (four) hours as needed for severe pain ((score 7 to 10)). 42 tablet 0  . pantoprazole (PROTONIX) 40 MG tablet Take 1 tablet (40 mg total) by mouth daily. 90 tablet 3  . promethazine (PHENERGAN) 25 MG tablet Take 25 mg by mouth every 6 (six) hours as needed for nausea or vomiting.    . traMADol (ULTRAM) 50 MG tablet Take 50-100 mg by mouth every 6 (six) hours as needed.    . fluconazole (DIFLUCAN) 200 MG tablet Take 2 tablets (400 mg total) by mouth daily. (Patient taking differently: Take 400 mg by mouth daily. As needed) 42 tablet 0  . Fluticasone-Salmeterol (ADVAIR) 250-50 MCG/DOSE AEPB Inhale 2 puffs into the lungs at bedtime.     . furosemide (LASIX) 40 MG tablet Take 40 mg by mouth daily.      No facility-administered medications prior to visit.     Per HPI unless specifically indicated in ROS section below Review of Systems Objective:    BP 112/70 (BP Location: Right Arm, Patient Position: Sitting, Cuff Size: Large) Comment (Cuff Size): Thigh cuff  Pulse 79   Temp (!) 97.5 F (36.4 C) (Temporal)   Ht '5\' 4"'$  (1.626 m)   Wt (!) 360 lb 4 oz (163.4 kg)   LMP  (LMP Unknown)   SpO2 96%   BMI 61.84 kg/m   Wt Readings from Last 3 Encounters:  11/03/19 (!) 360 lb 4 oz (163.4 kg)  10/31/19 (!) 369 lb (167.4 kg)  10/21/19 (!) 384 lb (174.2 kg)      Physical Exam Constitutional:      Appearance: Normal appearance. She is obese. She is ill-appearing.     Comments: Sitting in wheelchair holding emesis basin  Neck:     Thyroid: No thyromegaly or thyroid tenderness.  Cardiovascular:     Rate and Rhythm: Normal rate and regular rhythm.     Pulses: Normal pulses.     Heart sounds: Normal heart sounds. No murmur.  Pulmonary:     Effort: Pulmonary effort is normal. No respiratory distress.     Breath sounds: Normal breath sounds.  Abdominal:     General: Bowel sounds are normal.     Palpations: There is no mass.     Tenderness: There is abdominal tenderness (diffuse) in the right upper quadrant, epigastric area and suprapubic area. There is no guarding or rebound.     Comments: Position changes cause increased eructation  Musculoskeletal:     Right lower leg: No edema.     Left lower leg: No edema.  Neurological:     Mental Status: She is alert.       Results for orders placed or performed during the hospital encounter of 10/31/19  SARS Coronavirus 2 Ag (30 min TAT) - Nasal Swab (BD Veritor Kit)   Specimen: Nasal Swab (BD Veritor Kit)  Result Value Ref Range   SARS Coronavirus 2 Ag NEGATIVE NEGATIVE  CBC with Differential  Result Value Ref Range   WBC 8.7 4.0 - 10.5 K/uL   RBC 5.07 3.87 - 5.11 MIL/uL   Hemoglobin 14.2 12.0 - 15.0 g/dL   HCT 42.4 36.0 - 46.0 %   MCV 83.6 80.0 - 100.0 fL   MCH 28.0 26.0 - 34.0 pg   MCHC 33.5 30.0 - 36.0 g/dL   RDW 15.4 11.5 - 15.5 %   Platelets 279 150 - 400 K/uL   nRBC 0.0 0.0 - 0.2 %   Neutrophils Relative % 66 %   Neutro Abs 5.7 1.7 - 7.7 K/uL   Lymphocytes Relative 20 %   Lymphs Abs 1.7 0.7 - 4.0 K/uL   Monocytes Relative 12 %   Monocytes Absolute 1.1 (H) 0.1 - 1.0 K/uL   Eosinophils Relative 1 %   Eosinophils Absolute 0.1 0.0 - 0.5 K/uL   Basophils Relative 0 %   Basophils Absolute 0.0 0.0 - 0.1 K/uL   Immature Granulocytes 1 %   Abs Immature Granulocytes 0.04 0.00 - 0.07  K/uL  Comprehensive metabolic panel  Result Value Ref Range   Sodium 123 (L) 135 - 145 mmol/L   Potassium 3.6 3.5 - 5.1 mmol/L   Chloride 89 (L) 98 - 111 mmol/L   CO2 25 22 - 32 mmol/L   Glucose, Bld 211 (H) 70 - 99 mg/dL   BUN 13 8 - 23 mg/dL   Creatinine, Ser 0.64 0.44 - 1.00 mg/dL   Calcium 9.6 8.9 - 10.3 mg/dL   Total Protein 7.8 6.5 - 8.1 g/dL   Albumin 4.4 3.5 - 5.0 g/dL   AST 34 15 - 41 U/L   ALT 43 0 - 44 U/L   Alkaline Phosphatase 90 38 - 126 U/L   Total Bilirubin 0.7 0.3 - 1.2 mg/dL   GFR calc non Af Amer >60 >60 mL/min   GFR calc Af Amer >60 >60 mL/min   Anion gap 9 5 - 15  Lipase, blood  Result Value Ref Range   Lipase 24 11 - 51 U/L  Urinalysis, Routine w reflex microscopic  Result Value Ref Range   Color, Urine YELLOW YELLOW   APPearance HAZY (A) CLEAR   Specific Gravity, Urine 1.010 1.005 - 1.030   pH 7.5 5.0 - 8.0   Glucose, UA NEGATIVE NEGATIVE mg/dL   Hgb urine dipstick TRACE (A) NEGATIVE   Bilirubin Urine NEGATIVE NEGATIVE   Ketones, ur NEGATIVE NEGATIVE mg/dL   Protein, ur NEGATIVE NEGATIVE mg/dL   Nitrite POSITIVE (A) NEGATIVE   Leukocytes,Ua NEGATIVE NEGATIVE  Urinalysis, Microscopic (reflex)  Result Value Ref Range   RBC / HPF 0-5 0 - 5 RBC/hpf  WBC, UA NONE SEEN 0 - 5 WBC/hpf   Bacteria, UA MANY (A) NONE SEEN   Squamous Epithelial / LPF 0-5 0 - 5  Troponin I (High Sensitivity)  Result Value Ref Range   Troponin I (High Sensitivity) 25 (H) <18 ng/L  Troponin I (High Sensitivity)  Result Value Ref Range   Troponin I (High Sensitivity) 28 (H) <18 ng/L   Lab Results  Component Value Date   TSH 1.86 01/14/2019    Lab Results  Component Value Date   HGBA1C 7.3 (H) 08/08/2019    Assessment & Plan:  This visit occurred during the SARS-CoV-2 public health emergency.  Safety protocols were in place, including screening questions prior to the visit, additional usage of staff PPE, and extensive cleaning of exam room while observing appropriate  contact time as indicated for disinfecting solutions.   Problem List Items Addressed This Visit    Urinary urgency    Will stay off Lisbeth Ply which could have slowed stomach emptying and precipitated recent nausea/vomiting issues - and likely all antimuscarinics.       Non-intractable vomiting - Primary    Anticipate tolterodine Lisbeth Ply) led to decreased GI motility and subsequent nausea/vomiting which may have precipitated gastritis/esophagitis or PUD.  Will increase PPI frequency, start carafate, refer urgently to GI, ER precautions reviewed in interim.  Discussed zofran/phenergan use.       Relevant Orders   Ambulatory referral to Gastroenterology   Morbid obesity with BMI of 60.0-69.9, adult ([place])    35 lb weight loss in the past month.       Hypothyroidism    Will not recheck today during acute illness.       Hyponatremia    New - likely related to vomiting. Last emesis was 2 nights ago. Update level today. If lower, will need to return to ER. Pt/daughter aware.       Relevant Orders   Basic metabolic panel   GERD    Has continued pantoprazole '40mg'$  daily. H/o gastritis on EGD 2013 - states symptoms were similar.  See below.       Relevant Medications   sucralfate (CARAFATE) 1 g tablet   Essential hypertension    BP stable today.       Relevant Medications   furosemide (LASIX) 40 MG tablet   Epigastric abdominal pain    Suspect gastritis/PUD after recent vomiting - will increase PPI to BID and start carafate tablets QID, continue clear liquid diet for now. ER precautions reviewed.       Relevant Orders   Ambulatory referral to Gastroenterology   Bipolar disorder [facility])    She is on both trilpetal and lamictal - which could both contribute to GI symptoms - however these are longterm meds. Watch trileptal and sodium levels           Meds ordered this encounter  Medications  . Fluticasone-Salmeterol (ADVAIR) 250-50 MCG/DOSE AEPB    Sig: Inhale 2 puffs into the  lungs at bedtime.    Dispense:  60 each    Refill:  11  . sucralfate (CARAFATE) 1 g tablet    Sig: Take 1 tablet (1 g total) by mouth 4 (four) times daily -  with meals and at bedtime.    Dispense:  80 tablet    Refill:  0   Orders Placed This Encounter  Procedures  . Basic metabolic panel  . Ambulatory referral to Gastroenterology    Referral Priority:   Urgent    Referral  Type:   Consultation    Referral Reason:   Specialty Services Required    Number of Visits Requested:   1   Daughter requests phone call with lab results: [phone]  Patient Instructions  Labs today Increase protonix to '40mg'$  twice daily Start carafate tablets 4 times a day with meals.  We will refer you to GI for further evaluation If worsening, back to the ER.    Follow up plan: Return if symptoms worsen or fail to improve.  Ria Bush, MD

## 2019-11-03 NOTE — Assessment & Plan Note (Signed)
35 lb weight loss in the past month.

## 2019-11-03 NOTE — Assessment & Plan Note (Addendum)
Has continued pantoprazole 40mg  daily. H/o gastritis on EGD 2013 - states symptoms were similar.  See below.

## 2019-11-03 NOTE — Assessment & Plan Note (Signed)
Suspect gastritis/PUD after recent vomiting - will increase PPI to BID and start carafate tablets QID, continue clear liquid diet for now. ER precautions reviewed.

## 2019-11-03 NOTE — Assessment & Plan Note (Signed)
Will not recheck today during acute illness.

## 2019-11-04 ENCOUNTER — Encounter (HOSPITAL_COMMUNITY): Payer: Self-pay

## 2019-11-04 ENCOUNTER — Emergency Department (HOSPITAL_COMMUNITY): Payer: Medicare Other

## 2019-11-04 ENCOUNTER — Other Ambulatory Visit: Payer: Self-pay

## 2019-11-04 ENCOUNTER — Encounter: Payer: Self-pay | Admitting: Family Medicine

## 2019-11-04 ENCOUNTER — Inpatient Hospital Stay (HOSPITAL_COMMUNITY)
Admission: EM | Admit: 2019-11-04 | Discharge: 2019-11-06 | DRG: 683 | Disposition: A | Discharge status: Home / Self Care | Payer: Medicare Other | Attending: Family Medicine | Admitting: Family Medicine

## 2019-11-04 DIAGNOSIS — J449 Chronic obstructive pulmonary disease, unspecified: Secondary | ICD-10-CM | POA: Diagnosis present

## 2019-11-04 DIAGNOSIS — E785 Hyperlipidemia, unspecified: Secondary | ICD-10-CM | POA: Diagnosis present

## 2019-11-04 DIAGNOSIS — R479 Unspecified speech disturbances: Secondary | ICD-10-CM

## 2019-11-04 DIAGNOSIS — Z8614 Personal history of Methicillin resistant Staphylococcus aureus infection: Secondary | ICD-10-CM

## 2019-11-04 DIAGNOSIS — E039 Hypothyroidism, unspecified: Secondary | ICD-10-CM | POA: Diagnosis present

## 2019-11-04 DIAGNOSIS — N179 Acute kidney failure, unspecified: Principal | ICD-10-CM | POA: Diagnosis present

## 2019-11-04 DIAGNOSIS — F419 Anxiety disorder, unspecified: Secondary | ICD-10-CM | POA: Diagnosis present

## 2019-11-04 DIAGNOSIS — Z96643 Presence of artificial hip joint, bilateral: Secondary | ICD-10-CM | POA: Diagnosis present

## 2019-11-04 DIAGNOSIS — R531 Weakness: Secondary | ICD-10-CM | POA: Diagnosis not present

## 2019-11-04 DIAGNOSIS — Z7982 Long term (current) use of aspirin: Secondary | ICD-10-CM

## 2019-11-04 DIAGNOSIS — E860 Dehydration: Secondary | ICD-10-CM | POA: Diagnosis present

## 2019-11-04 DIAGNOSIS — Z881 Allergy status to other antibiotic agents status: Secondary | ICD-10-CM

## 2019-11-04 DIAGNOSIS — Z981 Arthrodesis status: Secondary | ICD-10-CM

## 2019-11-04 DIAGNOSIS — I10 Essential (primary) hypertension: Secondary | ICD-10-CM | POA: Diagnosis present

## 2019-11-04 DIAGNOSIS — Z8249 Family history of ischemic heart disease and other diseases of the circulatory system: Secondary | ICD-10-CM

## 2019-11-04 DIAGNOSIS — G2581 Restless legs syndrome: Secondary | ICD-10-CM | POA: Diagnosis present

## 2019-11-04 DIAGNOSIS — Z8042 Family history of malignant neoplasm of prostate: Secondary | ICD-10-CM

## 2019-11-04 DIAGNOSIS — I2510 Atherosclerotic heart disease of native coronary artery without angina pectoris: Secondary | ICD-10-CM | POA: Diagnosis present

## 2019-11-04 DIAGNOSIS — G894 Chronic pain syndrome: Secondary | ICD-10-CM | POA: Diagnosis present

## 2019-11-04 DIAGNOSIS — I959 Hypotension, unspecified: Secondary | ICD-10-CM | POA: Diagnosis not present

## 2019-11-04 DIAGNOSIS — Z20822 Contact with and (suspected) exposure to covid-19: Secondary | ICD-10-CM | POA: Diagnosis present

## 2019-11-04 DIAGNOSIS — R4781 Slurred speech: Secondary | ICD-10-CM | POA: Diagnosis present

## 2019-11-04 DIAGNOSIS — G459 Transient cerebral ischemic attack, unspecified: Secondary | ICD-10-CM | POA: Diagnosis not present

## 2019-11-04 DIAGNOSIS — F319 Bipolar disorder, unspecified: Secondary | ICD-10-CM | POA: Diagnosis present

## 2019-11-04 DIAGNOSIS — Z79899 Other long term (current) drug therapy: Secondary | ICD-10-CM

## 2019-11-04 DIAGNOSIS — H538 Other visual disturbances: Secondary | ICD-10-CM | POA: Diagnosis present

## 2019-11-04 DIAGNOSIS — Z6841 Body Mass Index (BMI) 40.0 and over, adult: Secondary | ICD-10-CM

## 2019-11-04 DIAGNOSIS — N390 Urinary tract infection, site not specified: Secondary | ICD-10-CM | POA: Diagnosis present

## 2019-11-04 DIAGNOSIS — H539 Unspecified visual disturbance: Secondary | ICD-10-CM

## 2019-11-04 DIAGNOSIS — R42 Dizziness and giddiness: Secondary | ICD-10-CM

## 2019-11-04 DIAGNOSIS — Z87891 Personal history of nicotine dependence: Secondary | ICD-10-CM

## 2019-11-04 DIAGNOSIS — Z833 Family history of diabetes mellitus: Secondary | ICD-10-CM

## 2019-11-04 DIAGNOSIS — R0902 Hypoxemia: Secondary | ICD-10-CM | POA: Diagnosis not present

## 2019-11-04 DIAGNOSIS — R55 Syncope and collapse: Secondary | ICD-10-CM | POA: Diagnosis not present

## 2019-11-04 LAB — COMPREHENSIVE METABOLIC PANEL
ALT: 34 U/L (ref 0–44)
AST: 24 U/L (ref 15–41)
Albumin: 4.4 g/dL (ref 3.5–5.0)
Alkaline Phosphatase: 86 U/L (ref 38–126)
Anion gap: 12 (ref 5–15)
BUN: 34 mg/dL — ABNORMAL HIGH (ref 8–23)
CO2: 27 mmol/L (ref 22–32)
Calcium: 9.2 mg/dL (ref 8.9–10.3)
Chloride: 91 mmol/L — ABNORMAL LOW (ref 98–111)
Creatinine, Ser: 2.29 mg/dL — ABNORMAL HIGH (ref 0.44–1.00)
GFR calc Af Amer: 25 mL/min — ABNORMAL LOW (ref >60)
GFR calc non Af Amer: 21 mL/min — ABNORMAL LOW (ref >60)
Glucose, Bld: 165 mg/dL — ABNORMAL HIGH (ref 70–99)
Potassium: 3.6 mmol/L (ref 3.5–5.1)
Sodium: 130 mmol/L — ABNORMAL LOW (ref 135–145)
Total Bilirubin: 0.9 mg/dL (ref 0.3–1.2)
Total Protein: 7.3 g/dL (ref 6.5–8.1)

## 2019-11-04 LAB — LIPASE, BLOOD: Lipase: 71 U/L — ABNORMAL HIGH (ref 11–51)

## 2019-11-04 LAB — CBC WITH DIFFERENTIAL/PLATELET
Abs Immature Granulocytes: 0.06 10*3/uL (ref 0.00–0.07)
Basophils Absolute: 0 10*3/uL (ref 0.0–0.1)
Basophils Relative: 0 %
Eosinophils Absolute: 0.2 10*3/uL (ref 0.0–0.5)
Eosinophils Relative: 2 %
HCT: 40.9 % (ref 36.0–46.0)
Hemoglobin: 13.7 g/dL (ref 12.0–15.0)
Immature Granulocytes: 1 %
Lymphocytes Relative: 19 %
Lymphs Abs: 2.1 10*3/uL (ref 0.7–4.0)
MCH: 28.4 pg (ref 26.0–34.0)
MCHC: 33.5 g/dL (ref 30.0–36.0)
MCV: 84.9 fL (ref 80.0–100.0)
Monocytes Absolute: 1.1 10*3/uL — ABNORMAL HIGH (ref 0.1–1.0)
Monocytes Relative: 10 %
Neutro Abs: 7.6 10*3/uL (ref 1.7–7.7)
Neutrophils Relative %: 68 %
Platelets: 255 10*3/uL (ref 150–400)
RBC: 4.82 MIL/uL (ref 3.87–5.11)
RDW: 15.6 % — ABNORMAL HIGH (ref 11.5–15.5)
WBC: 11 10*3/uL — ABNORMAL HIGH (ref 4.0–10.5)
nRBC: 0 % (ref 0.0–0.2)

## 2019-11-04 LAB — MAGNESIUM: Magnesium: 2.1 mg/dL (ref 1.7–2.4)

## 2019-11-04 LAB — TSH: TSH: 2.16 u[IU]/mL (ref 0.350–4.500)

## 2019-11-04 LAB — RESPIRATORY PANEL BY RT PCR (FLU A&B, COVID)
Influenza A by PCR: NEGATIVE
Influenza B by PCR: NEGATIVE
SARS Coronavirus 2 by RT PCR: NEGATIVE

## 2019-11-04 MED ORDER — SUCRALFATE 1 G PO TABS
1.0000 g | ORAL_TABLET | Freq: Three times a day (TID) | ORAL | Status: DC
  Administered 2019-11-04 – 2019-11-06 (×7): 1 g via ORAL
  Filled 2019-11-04 (×7): qty 1

## 2019-11-04 MED ORDER — OXCARBAZEPINE 300 MG PO TABS
300.0000 mg | ORAL_TABLET | Freq: Every morning | ORAL | Status: DC
  Administered 2019-11-05 – 2019-11-06 (×2): 300 mg via ORAL
  Filled 2019-11-04 (×2): qty 1

## 2019-11-04 MED ORDER — ONDANSETRON HCL 4 MG/2ML IJ SOLN: 4.0000 mg | Freq: Four times a day (QID) | INTRAMUSCULAR | Status: DC | PRN

## 2019-11-04 MED ORDER — BISACODYL 10 MG RE SUPP: 10.0000 mg | Freq: Every day | RECTAL | Status: DC | PRN

## 2019-11-04 MED ORDER — MONTELUKAST SODIUM 10 MG PO TABS
10.0000 mg | ORAL_TABLET | Freq: Every day | ORAL | Status: DC
  Administered 2019-11-04 – 2019-11-05 (×2): 10 mg via ORAL
  Filled 2019-11-04 (×2): qty 1

## 2019-11-04 MED ORDER — OXCARBAZEPINE 300 MG PO TABS
600.0000 mg | ORAL_TABLET | Freq: Every day | ORAL | Status: DC
  Administered 2019-11-04 – 2019-11-05 (×2): 600 mg via ORAL
  Filled 2019-11-04 (×3): qty 2

## 2019-11-04 MED ORDER — ENOXAPARIN SODIUM 80 MG/0.8ML ~~LOC~~ SOLN
80.0000 mg | Freq: Two times a day (BID) | SUBCUTANEOUS | Status: DC
  Administered 2019-11-04 – 2019-11-05 (×2): 80 mg via SUBCUTANEOUS
  Filled 2019-11-04 (×2): qty 0.8

## 2019-11-04 MED ORDER — AMLODIPINE BESYLATE 5 MG PO TABS
5.0000 mg | ORAL_TABLET | Freq: Every day | ORAL | Status: DC
  Filled 2019-11-04: qty 1

## 2019-11-04 MED ORDER — OXYBUTYNIN CHLORIDE ER 5 MG PO TB24
10.0000 mg | ORAL_TABLET | Freq: Every day | ORAL | Status: DC
  Administered 2019-11-04 – 2019-11-06 (×3): 10 mg via ORAL
  Filled 2019-11-04 (×3): qty 2

## 2019-11-04 MED ORDER — DOCUSATE SODIUM 100 MG PO CAPS
100.0000 mg | ORAL_CAPSULE | Freq: Two times a day (BID) | ORAL | Status: DC
  Administered 2019-11-04 – 2019-11-06 (×4): 100 mg via ORAL
  Filled 2019-11-04 (×4): qty 1

## 2019-11-04 MED ORDER — ALBUTEROL SULFATE (2.5 MG/3ML) 0.083% IN NEBU: 2.5000 mg | INHALATION_SOLUTION | Freq: Four times a day (QID) | RESPIRATORY_TRACT | Status: DC | PRN

## 2019-11-04 MED ORDER — METHOCARBAMOL 500 MG PO TABS: 750.0000 mg | ORAL_TABLET | Freq: Four times a day (QID) | ORAL | Status: DC | PRN

## 2019-11-04 MED ORDER — SODIUM CHLORIDE 0.9 % IV BOLUS
1000.0000 mL | Freq: Once | INTRAVENOUS | Status: AC
  Administered 2019-11-04: 1000 mL via INTRAVENOUS

## 2019-11-04 MED ORDER — FLUTICASONE FUROATE-VILANTEROL 200-25 MCG/INH IN AEPB
1.0000 | INHALATION_SPRAY | Freq: Every day | RESPIRATORY_TRACT | Status: DC
  Administered 2019-11-05 – 2019-11-06 (×2): 1 via RESPIRATORY_TRACT
  Filled 2019-11-04: qty 28

## 2019-11-04 MED ORDER — LAMOTRIGINE 100 MG PO TABS
200.0000 mg | ORAL_TABLET | Freq: Every day | ORAL | Status: DC
  Administered 2019-11-05 – 2019-11-06 (×2): 200 mg via ORAL
  Filled 2019-11-04 (×2): qty 2

## 2019-11-04 MED ORDER — CYCLOBENZAPRINE HCL 10 MG PO TABS
10.0000 mg | ORAL_TABLET | Freq: Three times a day (TID) | ORAL | Status: DC | PRN
  Administered 2019-11-06: 10 mg via ORAL
  Filled 2019-11-04: qty 1

## 2019-11-04 MED ORDER — ACETAMINOPHEN 650 MG RE SUPP: 650.0000 mg | Freq: Four times a day (QID) | RECTAL | Status: DC | PRN

## 2019-11-04 MED ORDER — ONDANSETRON HCL 4 MG PO TABS: 4.0000 mg | ORAL_TABLET | Freq: Four times a day (QID) | ORAL | Status: DC | PRN

## 2019-11-04 MED ORDER — ACETAMINOPHEN 325 MG PO TABS: 650.0000 mg | ORAL_TABLET | Freq: Four times a day (QID) | ORAL | Status: DC | PRN

## 2019-11-04 MED ORDER — SODIUM CHLORIDE 0.9 % IV SOLN
1.0000 g | Freq: Two times a day (BID) | INTRAVENOUS | Status: DC
  Administered 2019-11-04 – 2019-11-06 (×4): 1 g via INTRAVENOUS
  Filled 2019-11-04 (×6): qty 1

## 2019-11-04 MED ORDER — FLUOXETINE HCL 20 MG PO CAPS
40.0000 mg | ORAL_CAPSULE | Freq: Every day | ORAL | Status: DC
  Administered 2019-11-05 – 2019-11-06 (×2): 40 mg via ORAL
  Filled 2019-11-04 (×2): qty 2

## 2019-11-04 MED ORDER — PANTOPRAZOLE SODIUM 40 MG PO TBEC
40.0000 mg | DELAYED_RELEASE_TABLET | Freq: Every day | ORAL | Status: DC
  Administered 2019-11-05 – 2019-11-06 (×2): 40 mg via ORAL
  Filled 2019-11-04 (×3): qty 1

## 2019-11-04 MED ORDER — ALBUTEROL SULFATE HFA 108 (90 BASE) MCG/ACT IN AERS: 2.0000 | INHALATION_SPRAY | Freq: Four times a day (QID) | RESPIRATORY_TRACT | Status: DC | PRN

## 2019-11-04 MED ORDER — SODIUM CHLORIDE 0.9% FLUSH
3.0000 mL | Freq: Two times a day (BID) | INTRAVENOUS | Status: DC
  Administered 2019-11-04 – 2019-11-05 (×2): 3 mL via INTRAVENOUS

## 2019-11-04 MED ORDER — LEVOTHYROXINE SODIUM 75 MCG PO TABS
150.0000 ug | ORAL_TABLET | Freq: Every day | ORAL | Status: DC
  Administered 2019-11-04 – 2019-11-05 (×2): 150 ug via ORAL
  Filled 2019-11-04 (×2): qty 2

## 2019-11-04 MED ORDER — SIMVASTATIN 40 MG PO TABS
40.0000 mg | ORAL_TABLET | Freq: Every evening | ORAL | Status: DC
  Administered 2019-11-04 – 2019-11-05 (×2): 40 mg via ORAL
  Filled 2019-11-04 (×2): qty 1

## 2019-11-04 MED ORDER — VITAMIN D 25 MCG (1000 UNIT) PO TABS
1000.0000 [IU] | ORAL_TABLET | Freq: Every day | ORAL | Status: DC
  Administered 2019-11-04 – 2019-11-06 (×3): 1000 [IU] via ORAL
  Filled 2019-11-04 (×3): qty 1

## 2019-11-04 MED ORDER — ASPIRIN EC 81 MG PO TBEC
81.0000 mg | DELAYED_RELEASE_TABLET | Freq: Every day | ORAL | Status: DC
  Administered 2019-11-04 – 2019-11-05 (×2): 81 mg via ORAL
  Filled 2019-11-04 (×2): qty 1

## 2019-11-04 MED ORDER — OXYCODONE HCL 5 MG PO TABS: 10.0000 mg | ORAL_TABLET | ORAL | Status: DC | PRN

## 2019-11-04 MED ORDER — SODIUM CHLORIDE 0.9 % IV SOLN: INTRAVENOUS | Status: DC

## 2019-11-04 MED ORDER — OXCARBAZEPINE 300 MG PO TABS: 300.0000 mg | ORAL_TABLET | ORAL | Status: DC

## 2019-11-04 NOTE — ED Provider Notes (Signed)
[facility] Note   CSN: [id number] Arrival date & time: 11/04/19  1420     History Chief Complaint  Patient presents with  . Slurred Speach    Amy Barnes is a 69 y.o. female.  The history is provided by the patient and medical records. No language interpreter was used.  Neurologic Problem This is a new problem. The current episode started 1 to 2 hours ago. The problem occurs rarely. The problem has been resolved. Pertinent negatives include no chest pain, no abdominal pain, no headaches and no shortness of breath. Nothing aggravates the symptoms. Nothing relieves the symptoms. She has tried nothing for the symptoms. The treatment provided no relief.       Past Medical History:  Diagnosis Date  . Allergic rhinitis   . Ambulates with cane    straight  . Anemia   . Anxiety   . Asthma    seasonal  . Bipolar affective disorder (HCC)    takes Synthroid meds for Bipolar  . CAD (coronary artery disease) 07/2016   by CT scan  . Carpal tunnel syndrome    had surgery but occasional still has some issues per patient  . Cataract   . Centrilobular emphysema (HCC) 07/2016   by CT scan - pt not aware of this  . Constipation due to pain medication   . Depression with anxiety   . Diabetes mellitus    type 2 - no meds diet controlled  . GERD (gastroesophageal reflux disease)   . History of blood transfusion   . 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 Hyperlipidemia   . Hypertension   . OSA (obstructive sleep apnea)    no longer using cpap, uses a bed that raises and lowers hob  . Osteoarthritis   . Osteoarthritis   . Osteopenia 08/11/2019   DEXA 07/2019 - T -1.1 R femur (osteopenia)  . Pneumonia   . Restless legs   . Septic arthritis (HCC) 10/11/2012  . Shingles 06/30/2016  . Shortness of breath    with exertion  . Status post revision of total hip replacement bilateral   prosthetic infection R  2013, L 2015  . SVD (spontaneous vaginal delivery)    x 3  . Thoracic aortic atherosclerosis (HCC) 11/207   by CT    Patient Active Problem List   Diagnosis Date Noted  . Hyponatremia 11/03/2019  . Epigastric abdominal pain 11/03/2019  . Non-intractable vomiting 11/03/2019  . Diminished pulses in lower extremity 08/11/2019  . Osteopenia 08/11/2019  . Wound infection complicating hardware (HCC) 04/01/2019  . 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 Bilateral lower extremity edema 11/29/2018  . Neurogenic urinary incontinence 11/29/2018  . Urinary incontinence 11/29/2018  . Bladder spasms 11/29/2018  . Discogenic low back pain 11/29/2018  . Chronic sacroiliac joint pain (Bilateral) (R>L) 11/29/2018  . Spondylosis without myelopathy or radiculopathy, lumbar region 11/09/2018  . Chronic low back pain (Bilateral) (L>R) w/o sciatica 11/09/2018  . History of hip replacement (Left) 10/13/2018  . History of back surgery 10/13/2018  . Vitamin D insufficiency 10/13/2018  . Osteoarthritis of facet joint of lumbar spine 10/13/2018  . Osteoarthritis involving multiple joints 10/13/2018  . Cervical radiculopathy (Left) 10/13/2018  . Lumbar facet arthropathy (Bilateral) 10/13/2018  . Lumbar facet syndrome (Bilateral) (L>R) 10/13/2018  . Abnormal MRI, lumbar spine (09/16/2018) 10/13/2018  . Lumbar nerve root compression (L4) (Left) 10/13/2018  .  Lumbar foraminal stenosis 10/13/2018  . 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 Chronic low back pain (Primary Area of Pain) (Bilateral) (L>R) w/ sciatica (Left) 09/14/2018  . Chronic lower extremity pain (Secondary Area of Pain) (Left) 09/14/2018  . Chronic hip pain [facility] Area of Pain) (Bilateral) (L>R) 09/14/2018  . Chronic knee pain (Fourth Area of Pain) (Bilateral) (R>L) 09/14/2018  . Pharmacologic therapy 09/14/2018  . Disorder of skeletal  system 09/14/2018  . Problems influencing health status 09/14/2018  . Primary osteoarthritis of first carpometacarpal joint of left hand 04/20/2018  . 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 01/16/2018  . PAH (pulmonary artery hypertension) (HCC) 08/03/2017  . Advanced care planning/counseling discussion 06/25/2017  . Synovial cyst of lumbar facet joint 11/13/2016  . Thoracic aortic atherosclerosis (HCC)   . CAD (coronary artery disease) 07/30/2016  . Centrilobular emphysema (HCC) 07/30/2016  . Ex-smoker 07/08/2016  . Nonspecific abnormal electrocardiogram (ECG) (EKG) 07/08/2016  . Chronic lumbar radicular pain (L4) (Bilateral) 06/09/2016  . DOE (dyspnea on exertion) 05/22/2016  . Degenerative joint disease (DJD) of hip 04/17/2016  . Somatic dysfunction of sacroiliac joint 04/17/2016  . S/P Lumbar Fusion (L3-4 PLIF) 04/17/2016  . Cervical fusion syndrome 04/17/2016  . Pedal edema 02/19/2016  . Chronic pain syndrome 02/19/2016  . Abdominal aortic atherosclerosis (HCC) 12/11/2015  . MRSA (methicillin resistant Staphylococcus aureus) infection 10/11/2012  . Urinary urgency 07/28/2012  . Infection or inflammatory reaction due to internal joint prosthesis (HCC) 12/03/2011  . Morbid obesity with BMI of 60.0-69.9, adult (HCC) 05/13/2011  . Essential hypertension 05/17/2010  . HIP PAIN 12/25/2009  . Cervical radiculopathy (Right) 12/25/2009  . Benign positional vertigo 07/21/2008  . Hyperlipidemia associated with type 2 diabetes mellitus (HCC) 08/16/2007  . Hypothyroidism 07/30/2007  . DM type 2 with diabetic peripheral neuropathy (HCC) 07/30/2007  . ANEMIA-NOS 07/30/2007  . Bipolar disorder (HCC) 07/30/2007  . Allergic rhinitis 07/30/2007  . Asthma 07/30/2007  . GERD 07/30/2007  . Osteoarthritis 07/30/2007  . OSA on CPAP 07/30/2007    Past Surgical History:  Procedure Laterality Date  . BREAST CYST ASPIRATION    . CARPAL TUNNEL RELEASE Right 05/15/2011  . CARPAL TUNNEL RELEASE  Left 12/24/2017   Procedure: LEFT CARPAL TUNNEL RELEASE;  Surgeon: [name], MD;  Location: [facility];  Service: Orthopedics;  Laterality: Left;  . CERVICAL FUSION  2012   C2/3/4  . COLONOSCOPY WITH PROPOFOL N/A 12/31/2015   diverticulosis, int hem, o/w normal rpt 10 yrs ([facility])  . EYE SURGERY Bilateral    cataract surgery with lens implant  . FOOT SURGERY  1982   bone spur  . I & D EXTREMITY  09/06/2012   Budd Palmer, MD; Right;  I&D of right thigh  . I & D EXTREMITY Left 07/2013   wound vac - daily doxycycline indefinitely  . KNEE ARTHROSCOPY  09/01/2012   Dannielle Huh, MD;  Right  . LUMBAR FUSION  01/08/2012   L3-4  . LUMBAR FUSION  02/2019   unexpectedly discovered MRSA infection - pus Lovell Sheehan)   . LUMBAR LAMINECTOMY/DECOMPRESSION MICRODISCECTOMY N/A 11/13/2016   LAMINOTOMY/LAMINECTOMY LUMBAR FOUR LUMBAR FIVE  WITH RESECTION OF SYNOVIAL CYST;  Surgeon: Tressie Stalker, MD  . NECK SURGERY     Herniated disk C2,3,4  . NOSE SURGERY    . PARTIAL HYSTERECTOMY  1984   for mennorhagia, ovaries remain  . REVISION TOTAL HIP ARTHROPLASTY Left 08/28/2011  . TMJ  ARTHROPLASTY  1982  . TOTAL HIP ARTHROPLASTY Left 2002  . TRIGGER FINGER RELEASE Right 05/15/2011   long finger     OB History   No obstetric history on file.     Family History  Problem Relation Age of Onset  . Aneurysm Father 13       brain  . Alcohol abuse Father   . Cancer Father        possibly  . CAD Other        several siblings  . Cancer Brother        prostate  . Diabetes Brother   . Diabetes Sister   . Anesthesia problems Neg Hx   . Hypotension Neg Hx   . Malignant hyperthermia Neg Hx   . Pseudochol deficiency Neg Hx   . Breast cancer Neg Hx     Social History   Tobacco Use  . Smoking status: Former Smoker    Packs/day: 1.50    Years: 40.00    Pack years: 60.00    Types: Cigarettes    Quit date: 04/29/2009    Years since quitting: 10.5  . Smokeless tobacco: Never Used    Substance Use Topics  . Alcohol use: Yes    Alcohol/week: 21.0 standard drinks    Types: 21 Shots of liquor per week    Comment: 3 shots/per night  . Drug use: No    Home Medications Prior to Admission medications   Medication Sig Start Date End Date Taking? Authorizing Provider  albuterol (PROVENTIL HFA;VENTOLIN HFA) 108 (90 Base) MCG/ACT inhaler Inhale 2 puffs into the lungs every 6 (six) hours as needed for wheezing or shortness of breath. 12/14/18   Ria Bush, MD  amLODipine (NORVASC) 5 MG tablet Take 1 tablet (5 mg total) by mouth daily. 10/17/19   Ria Bush, MD  aspirin 81 MG EC tablet Take 81 mg by mouth at bedtime.     [provider]  celecoxib (CELEBREX) 200 MG capsule TAKE ONE CAPSULE BY MOUTH DAILY 06/14/19   Ria Bush, MD  Cholecalciferol (VITAMIN D3) 25 MCG (1000 UT) CAPS Take 1 capsule (1,000 Units total) by mouth daily. 08/11/19   Ria Bush, MD  cyclobenzaprine (FLEXERIL) 10 MG tablet TAKE 1 TABLET BY MOUTH 3 TIMES EVERY DAY AS NEEDED FOR MUSCLE SPASMS 05/29/19   [provider]  doxycycline (VIBRA-TABS) 100 MG tablet Take 100 mg by mouth 2 (two) times daily. 07/31/19   [provider]  FLUoxetine (PROZAC) 40 MG capsule Take by mouth daily. 03/08/19   [provider]  Fluticasone-Salmeterol (ADVAIR) 250-50 MCG/DOSE AEPB Inhale 2 puffs into the lungs at bedtime. 11/03/19   Ria Bush, MD  furosemide (LASIX) 40 MG tablet Take 1 tablet (40 mg total) by mouth daily as needed for fluid. 11/03/19   Ria Bush, MD  lamoTRIgine (LAMICTAL) 200 MG tablet Take 200 mg by mouth daily.      [provider]  levothyroxine (SYNTHROID) 150 MCG tablet Take 1 tablet (150 mcg total) by mouth at bedtime. 01/21/19   Ria Bush, MD  losartan (COZAAR) 100 MG tablet TAKE 1 TABLET BY MOUTH EVERY DAY 09/05/19   Ria Bush, MD  methocarbamol (ROBAXIN-750) 750 MG tablet Take 1 tablet (750 mg total) by mouth every  6 (six) hours as needed for muscle spasms. 03/25/19   Bergman, Trinda Pascal D, NP  montelukast (SINGULAIR) 10 MG tablet TAKE 1 TABLET BY MOUTH EVERYDAY AT BEDTIME Patient taking differently: Take 10 mg by mouth at  bedtime.  02/28/19   [name], MD  Multiple Vitamin (MULTIVITAMIN WITH MINERALS) TABS tablet Take 2 tablets by mouth daily.     [provider]  nystatin (MYCOSTATIN/NYSTOP) powder Apply topically 4 (four) times daily. 02/22/19   Reva Bores, MD  ondansetron (ZOFRAN ODT) 8 MG disintegrating tablet Take 1 tablet (8 mg total) by mouth 2 (two) times daily as needed for nausea or vomiting. 11/02/19   [name], MD  ondansetron (ZOFRAN) 4 MG tablet Take 1 tablet (4 mg total) by mouth 2 (two) times daily. 05/19/19   Judyann Munson, MD  Oxcarbazepine (TRILEPTAL) 300 MG tablet Take 300-600 mg by mouth See admin instructions. Take 300 mg by mouth in the morning and 600 mg at night    [provider]  oxybutynin (DITROPAN-XL) 10 MG 24 hr tablet Take 1 tablet (10 mg total) by mouth daily. 11/03/19   Jerilee Field, MD  oxyCODONE 10 MG TABS Take 1 tablet (10 mg total) by mouth every 4 (four) hours as needed for severe pain ((score 7 to 10)). 03/25/19   Val Eagle D, NP  pantoprazole (PROTONIX) 40 MG tablet Take 1 tablet (40 mg total) by mouth daily. 01/21/19   [name], MD  promethazine (PHENERGAN) 25 MG tablet Take 25 mg by mouth every 6 (six) hours as needed for nausea or vomiting.    [provider]  sucralfate (CARAFATE) 1 g tablet Take 1 tablet (1 g total) by mouth 4 (four) times daily -  with meals and at bedtime. 11/03/19   [name], MD  traMADol (ULTRAM) 50 MG tablet Take 50-100 mg by mouth every 6 (six) hours as needed. 04/27/19   [provider]    Allergies    Cephalexin, Hydrocodone, Tolterodine tartrate, Risperidone and related, Seroquel [quetiapine fumarate], and Sulfa antibiotics  Review of Systems   Review of Systems    Constitutional: Positive for fatigue. Negative for chills, diaphoresis and fever.  HENT: Negative for congestion.   Eyes: Positive for visual disturbance.  Respiratory: Negative for cough, chest tightness, shortness of breath, wheezing and stridor.   Cardiovascular: Negative for chest pain.  Gastrointestinal: Positive for nausea and vomiting. Negative for abdominal distention, abdominal pain, constipation and diarrhea.  Genitourinary: Negative for dysuria, flank pain and frequency.  Musculoskeletal: Negative for back pain and neck pain.  Neurological: Positive for speech difficulty and light-headedness. Negative for dizziness, syncope, facial asymmetry, weakness and headaches.  Psychiatric/Behavioral: Negative for agitation.  All other systems reviewed and are negative.   Physical Exam Updated Vital Signs BP 119/80   Pulse 71   Temp 97.8 F (36.6 C) (Oral)   Resp 18   LMP  (LMP Unknown)   SpO2 96%   Physical Exam Vitals and nursing note reviewed.  Constitutional:      General: She is not in acute distress.    Appearance: She is well-developed. She is not ill-appearing, toxic-appearing or diaphoretic.  HENT:     Head: Normocephalic and atraumatic.     Right Ear: External ear normal.     Left Ear: External ear normal.     Nose: Nose normal. No congestion or rhinorrhea.     Mouth/Throat:     Mouth: Mucous membranes are moist.     Pharynx: No oropharyngeal exudate.  Eyes:     Conjunctiva/sclera: Conjunctivae normal.     Pupils: Pupils are equal, round, and reactive to light.  Cardiovascular:     Rate and Rhythm: Normal rate.  Pulses: Normal pulses.     Heart sounds: No murmur.  Pulmonary:     Effort: No respiratory distress.     Breath sounds: No stridor.  Abdominal:     General: Abdomen is flat. There is no distension.     Tenderness: There is no abdominal tenderness. There is no right CVA tenderness, left CVA tenderness, guarding or rebound.  Musculoskeletal:         General: No tenderness.     Cervical back: Normal range of motion and neck supple. No tenderness.     Right lower leg: No edema.     Left lower leg: No edema.  Skin:    General: Skin is warm.     Capillary Refill: Capillary refill takes less than 2 seconds.     Findings: No erythema or rash.  Neurological:     General: No focal deficit present.     Mental Status: She is alert and oriented to person, place, and time.     Cranial Nerves: No cranial nerve deficit.     Sensory: No sensory deficit.     Motor: No weakness or abnormal muscle tone.     Coordination: Coordination normal.     Deep Tendon Reflexes: Reflexes are normal and symmetric.  Psychiatric:        Mood and Affect: Mood normal.     ED Results / Procedures / Treatments   Labs (all labs ordered are listed, but only abnormal results are displayed) Labs Reviewed  CBC WITH DIFFERENTIAL/PLATELET - Abnormal; Notable for the following components:      Result Value   WBC 11.0 (*)    RDW 15.6 (*)    Monocytes Absolute 1.1 (*)    All other components within normal limits  COMPREHENSIVE METABOLIC PANEL - Abnormal; Notable for the following components:   Sodium 130 (*)    Chloride 91 (*)    Glucose, Bld 165 (*)    BUN 34 (*)    Creatinine, Ser 2.29 (*)    GFR calc non Af Amer 21 (*)    GFR calc Af Amer 25 (*)    All other components within normal limits  LIPASE, BLOOD - Abnormal; Notable for the following components:   Lipase 71 (*)    All other components within normal limits  URINE CULTURE  RESPIRATORY PANEL BY RT PCR (FLU A&B, COVID)  TSH  MAGNESIUM  URINALYSIS, ROUTINE W REFLEX MICROSCOPIC    EKG EKG Interpretation  Date/Time:  Friday November 04 2019 15:30:47 EST Ventricular Rate:  68 PR Interval:    QRS Duration: 141 QT Interval:  440 QTC Calculation: 468 R Axis:   -46 Text Interpretation: Sinus rhythm Short PR interval Left bundle branch block When compared to prior, more wandering baseline and  impoved QTc. No STEMI Confirmed by Theda Belfast ([id number]) on 11/04/2019 3:33:14 PM   Radiology No results found.  Procedures Procedures (including critical care time)  Medications Ordered in ED Medications  sodium chloride 0.9 % bolus 1,000 mL (0 mLs Intravenous Stopped 11/04/19 1632)  sodium chloride 0.9 % bolus 1,000 mL (1,000 mLs Intravenous New Bag/Given 11/04/19 1656)    ED Course  I have reviewed the triage vital signs and the nursing notes.  Pertinent labs & imaging results that were available during my care of the patient were reviewed by me and considered in my medical decision making (see chart for details).    MDM Rules/Calculators/A&P  Amy Barnes is a 69 y.o. female with a complicated past medical history significant for CAD, pulmonary hypertension, morbid obesity, hypertension, vertigo, bipolar disorder, diabetes, hyperlipidemia, hypothyroidism, GERD, anxiety, asthma who presents with fatigue, lightheadedness, malaise, recent nausea/vomiting, and new episode of blurry vision and speech difficulty.  Patient reports that for the last week she has continued to feel sick and has been to the emergency department several times.  She reports she was found to be hyponatremic and was given fluids.  She is able to tolerate eating and drinking and was able to go home.  She reports she did not have further nausea and vomiting today and saw her PCP yesterday.  She says that given her malaise and fatigue she stayed in bed most the day but then went to the kitchen around 12:30 PM and sat down.  She reports that after resting, she was going to get up but then felt very fatigued and lightheaded.  She then had bilateral blurry vision and when she spoke to her husband she was told she had a speech difficulty.  She reports she was slurring her speech.  She reports his last for several minutes before resolving.  Her blurry vision also resolved.  She has never had the vision problem  before.  She does say that her medications have caused some speech difficulties in the past but have never sounded like this.  She denies any chest pain, palpitations, shortness of breath.  She was recently tested negative for Covid.  She denies any further abdominal pain and reports chronic back pain that is unchanged.  She denies any falls.  She reports that she continues to have chronic urinary problems.  On exam, I found no focal neurologic deficits.  She had normal sensation and strength in lower extremities and upper extremities.  Normal finger-nose-finger testing bilaterally.  Speech is clear.  No facial droop.  Normal extraocular movements and no aphasia.  Pupils are symmetric and reactive.  Normal visual fields.  Normal sensation in face.  Lungs were clear and chest was nontender.  Abdomen is nontender.  She had no other complaints on exam.  Clinically I suspect patient had more of a dehydration related lightheadedness episode leading to the vision changes and speech difficulty.  I have a lower suspicion for TIA or stroke however given her comorbidities and the reported speech abnormality with visual abnormalities at the same time, we will get MRI to rule out stroke.  She will be given fluids given the likely dehydration with a recent nausea vomiting, and lab abnormalities.  Chart review shows that she does indeed have a urinary tract infection on the urine culture from 6 days ago.  Anticipate treatment with antibiotic.  Awaiting reassessment after imaging and labs.  Patient is labs began to return and she has acute kidney injury compared to yesterday.  Patient will be given more fluids.  Anticipate admission for AKI with the lightheadedness and transient neurologic abnormalities.  She is still waiting results of her MRI prior to admission.  Care transferred to oncoming team awaiting results of MRI.    Final Clinical Impression(s) / ED Diagnoses Final diagnoses:  AKI (acute kidney injury)  (HCC)  Lightheaded  Transient speech disturbance  Transient vision disturbance of both eyes    Clinical Impression: 1. AKI (acute kidney injury) (HCC)   2. Lightheaded   3. Transient speech disturbance   4. Transient vision disturbance of both eyes     Disposition: Care transferred to oncoming team  to await admission for AKI after MRI is completed.  This note was prepared with assistance of Conservation officer, historic buildings. Occasional wrong-word or sound-a-like substitutions may have occurred due to the inherent limitations of voice recognition software.     Chiara Coltrin, Canary Brim, MD 11/04/19 [phone]

## 2019-11-04 NOTE — Progress Notes (Signed)
Pharmacy Antibiotic Note  Amy Barnes is a 69 y.o. female admitted on 11/04/2019 with slurred speech, weakness, fatigue.  Pharmacy has been consulted for meropenem dosing for ESBL Klebsiella oxytoca in UCx drawn during previous ED visits.   WBC 11, SCr 2.29 ( up from 0.94). No dysuria per MD notes.  Pt with hives to cephalexin.   Plan: Meropenem 1 gm IV q12 F/u UCx, renal fxn, WBC, temp De-escalate ASAP    Temp (24hrs), Avg:97.8 F (36.6 C), Min:97.8 F (36.6 C), Max:97.8 F (36.6 C)  Recent Labs  Lab 10/30/19 1641 10/31/19 1353 11/03/19 1301 11/04/19 1535  WBC 7.8 8.7  --  11.0*  CREATININE 0.55 0.64 0.94 2.29*    Estimated Creatinine Clearance: 37 mL/min (A) (by C-G formula based on SCr of 2.29 mg/dL (H)).    Allergies  Allergen Reactions  . Cephalexin Hives  . Hydrocodone Other (See Comments)    Reaction:  Hallucinations   . Tolterodine Tartrate Nausea And Vomiting  . Risperidone And Related Other (See Comments)    Reaction:  Made pt excessively sleepy  . Seroquel [Quetiapine Fumarate] Other (See Comments)    Reaction:  Made pt excessively sleepy  . Sulfa Antibiotics Rash   Antimicrobials this admission:  2/5 meropnem>> Dose adjustments this admission:   Microbiology results:  2/5 Covid: sent 2/1 Covid neg PTA:  1/31 UCx:  > 100K ESBL Klebsiella oxytoca  Thank you for allowing pharmacy to be a part of this patient's care.  Herby Abraham, Pharm.D [phone] 11/04/2019 6:46 PM

## 2019-11-04 NOTE — ED Notes (Signed)
ED TO INPATIENT HANDOFF REPORT  ED Nurse Name and Phone #: jon wled   S Name/Age/Gender Amy Barnes 69 y.o. female Room/Bed: WA10/WA10  Code Status   Code Status: Full Code  Home/SNF/Other Home {Patient oriented   Triage Complete: Triage complete  Chief Complaint Acute kidney injury [facility]) [N17.9]  Triage Note EMS reports episode of slurred speech and weakness approx 1230 which has since fully resolved. Family wishes to have Pt evaluated. Pt denies continuing symptoms, pain, denies any other complaint.  BP 118/90 HR 67 RR 18 Sp02 94 RA CGB 202 Temp 97.5    Allergies Allergies  Allergen Reactions  . Cephalexin Hives  . Hydrocodone Other (See Comments)    Reaction:  Hallucinations   . Tolterodine Tartrate Nausea And Vomiting  . Risperidone And Related Other (See Comments)    Reaction:  Made pt excessively sleepy  . Seroquel [Quetiapine Fumarate] Other (See Comments)    Reaction:  Made pt excessively sleepy  . Sulfa Antibiotics Rash    Level of Care/Admitting Diagnosis ED Disposition    ED Disposition Condition Comment   Admit  Hospital Area: [facility] [[id number]]  Level of Care: Telemetry [5]  Admit to tele based on following criteria: Eval of Syncope  Covid Evaluation: Confirmed COVID Negative  Date Laboratory Confirmed COVID Negative: 11/03/2019  Diagnosis: Acute kidney injury [facility]) [[id number]]  Admitting Physician: Leda Gauze  Attending Physician: Leda Gauze  Estimated length of stay: past midnight tomorrow  Certification:: I certify this patient will need inpatient services for at least 2 midnights       B Medical/Surgery History Past Medical History:  Diagnosis Date  . Allergic rhinitis   . Ambulates with cane    straight  . Anemia   . Anxiety   . Asthma    seasonal  . Bipolar affective disorder (HCC)    takes Synthroid meds for Bipolar  . CAD (coronary artery disease) 07/2016   by CT scan  .  Carpal tunnel syndrome    had surgery but occasional still has some issues per patient  . Cataract   . Centrilobular emphysema (HCC) 07/2016   by CT scan - pt not aware of this  . Constipation due to pain medication   . Depression with anxiety   . Diabetes mellitus    type 2 - no meds diet controlled  . GERD (gastroesophageal reflux disease)   . History of blood transfusion   . 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 Hyperlipidemia   . Hypertension   . OSA (obstructive sleep apnea)    no longer using cpap, uses a bed that raises and lowers hob  . Osteoarthritis   . Osteoarthritis   . Osteopenia 08/11/2019   DEXA 07/2019 - T -1.1 R femur (osteopenia)  . Pneumonia   . Restless legs   . Septic arthritis (HCC) 10/11/2012  . Shingles 06/30/2016  . Shortness of breath    with exertion  . Status post revision of total hip replacement bilateral   prosthetic infection R 2013, L 2015  . SVD (spontaneous vaginal delivery)    x 3  . Thoracic aortic atherosclerosis (HCC) 11/207   by CT   Past Surgical History:  Procedure Laterality Date  . BREAST CYST ASPIRATION    . CARPAL TUNNEL RELEASE Right 05/15/2011  . CARPAL TUNNEL RELEASE Left 12/24/2017   Procedure: LEFT CARPAL TUNNEL RELEASE;  Surgeon: [name],  MD;  Location: [place];  Service: Orthopedics;  Laterality: Left;  . CERVICAL FUSION  2012   C2/3/4  . COLONOSCOPY WITH PROPOFOL N/A 12/31/2015   diverticulosis, int hem, o/w normal rpt 10 yrs ([facility])  . EYE SURGERY Bilateral    cataract surgery with lens implant  . FOOT SURGERY  1982   bone spur  . I & D EXTREMITY  09/06/2012   [name], MD; Right;  I&D of right thigh  . I & D EXTREMITY Left 07/2013   wound vac - daily doxycycline indefinitely  . KNEE ARTHROSCOPY  09/01/2012   [name], MD;  Right  . LUMBAR FUSION  01/08/2012   L3-4  . LUMBAR FUSION  02/2019   unexpectedly discovered MRSA infection - pus [name])    . LUMBAR LAMINECTOMY/DECOMPRESSION MICRODISCECTOMY N/A 11/13/2016   LAMINOTOMY/LAMINECTOMY LUMBAR FOUR LUMBAR FIVE  WITH RESECTION OF SYNOVIAL CYST;  Surgeon: Newman Pies, MD  . NECK SURGERY     Herniated disk C2,3,4  . NOSE SURGERY    . PARTIAL HYSTERECTOMY  1984   for mennorhagia, ovaries remain  . REVISION TOTAL HIP ARTHROPLASTY Left 08/28/2011  . TMJ ARTHROPLASTY  1982  . TOTAL HIP ARTHROPLASTY Left 2002  . TRIGGER FINGER RELEASE Right 05/15/2011   long finger     A IV Location/Drains/Wounds Patient Lines/Drains/Airways Status   Active Line/Drains/Airways    Name:   Placement date:   Placement time:   Site:   Days:   Peripheral IV 11/04/19 Left Antecubital   11/04/19    1539    Antecubital   less than 1   Incision (Closed) 11/13/16 Back Other (Comment)   11/13/16    1036     1086   Incision (Closed) 12/24/17 Arm Left   12/24/17    0902     680   Incision (Closed) 03/23/19 Back   03/23/19    1609     226          Intake/Output Last 24 hours  Intake/Output Summary (Last 24 hours) at 11/04/2019 1931 Last data filed at 11/04/2019 1820 Gross per 24 hour  Intake 2000 ml  Output -  Net 2000 ml    Labs/Imaging Results for orders placed or performed during the hospital encounter of 11/04/19 (from the past 48 hour(s))  CBC with Differential     Status: Abnormal   Collection Time: 11/04/19  3:35 PM  Result Value Ref Range   WBC 11.0 (H) 4.0 - 10.5 K/uL   RBC 4.82 3.87 - 5.11 MIL/uL   Hemoglobin 13.7 12.0 - 15.0 g/dL   HCT 40.9 36.0 - 46.0 %   MCV 84.9 80.0 - 100.0 fL   MCH 28.4 26.0 - 34.0 pg   MCHC 33.5 30.0 - 36.0 g/dL   RDW 15.6 (H) 11.5 - 15.5 %   Platelets 255 150 - 400 K/uL   nRBC 0.0 0.0 - 0.2 %   Neutrophils Relative % 68 %   Neutro Abs 7.6 1.7 - 7.7 K/uL   Lymphocytes Relative 19 %   Lymphs Abs 2.1 0.7 - 4.0 K/uL   Monocytes Relative 10 %   Monocytes Absolute 1.1 (H) 0.1 - 1.0 K/uL   Eosinophils Relative 2 %   Eosinophils Absolute 0.2 0.0 - 0.5 K/uL    Basophils Relative 0 %   Basophils Absolute 0.0 0.0 - 0.1 K/uL   Immature Granulocytes 1 %   Abs Immature Granulocytes 0.06 0.00 - 0.07 K/uL  Comment: Performed at [facility], [address], [place], [place] [address]  Comprehensive metabolic panel     Status: Abnormal   Collection Time: 11/04/19  3:35 PM  Result Value Ref Range   Sodium 130 (L) 135 - 145 mmol/L   Potassium 3.6 3.5 - 5.1 mmol/L   Chloride 91 (L) 98 - 111 mmol/L   CO2 27 22 - 32 mmol/L   Glucose, Bld 165 (H) 70 - 99 mg/dL   BUN 34 (H) 8 - 23 mg/dL   Creatinine, Ser 8.67 (H) 0.44 - 1.00 mg/dL   Calcium 9.2 8.9 - 67.2 mg/dL   Total Protein 7.3 6.5 - 8.1 g/dL   Albumin 4.4 3.5 - 5.0 g/dL   AST 24 15 - 41 U/L   ALT 34 0 - 44 U/L   Alkaline Phosphatase 86 38 - 126 U/L   Total Bilirubin 0.9 0.3 - 1.2 mg/dL   GFR calc non Af Amer 21 (L) >60 mL/min   GFR calc Af Amer 25 (L) >60 mL/min   Anion gap 12 5 - 15    Comment: Performed at [facility], [address], [place], [place] [address]  Lipase, blood     Status: Abnormal   Collection Time: 11/04/19  3:35 PM  Result Value Ref Range   Lipase 71 (H) 11 - 51 U/L    Comment: Performed at [facility], [address] [place], [place] [address]  TSH     Status: None   Collection Time: 11/04/19  3:35 PM  Result Value Ref Range   TSH 2.160 0.350 - 4.500 uIU/mL    Comment: Performed by a 3rd Generation assay with a functional sensitivity of <=0.01 uIU/mL. Performed at [facility], [address], [place], [place] [address]   Magnesium     Status: None   Collection Time: 11/04/19  3:35 PM  Result Value Ref Range   Magnesium 2.1 1.7 - 2.4 mg/dL    Comment: Performed at [facility], [address] [place], [place] [address]   MR BRAIN WO CONTRAST  Result Date: 11/04/2019 CLINICAL DATA:  TIA.  Slurred speech and blurry vision. EXAM: MRI HEAD WITHOUT CONTRAST TECHNIQUE:  Multiplanar, multiecho pulse sequences of the brain and surrounding structures were obtained without intravenous contrast. COMPARISON:  Head CT 05/26/2019 FINDINGS: Brain: No acute infarction, hemorrhage, hydrocephalus, extra-axial collection or mass lesion. 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 Wedge following the left basal ganglia is difficult to distinguish between a normal vessel and remote perforator infarct. Brain volume is normal. No acute infarct, hydrocephalus, collection, or masslike finding. Vascular: Normal flow voids Skull and upper cervical spine: Metal artifact near the left zygomatic arch. No evidence of marrow lesion. Sinuses/Orbits: Negative IMPRESSION: 1. Motion degraded brain MRI without acute finding. 2. 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 Electronically Signed   By: Marnee Spring M.D.   On: 11/04/2019 17:00    Pending Labs Unresulted Labs (From admission, onward)    Start     Ordered   11/11/19 0500  Creatinine, serum  (enoxaparin (LOVENOX)    CrCl >/= 30 ml/min)  Weekly,   R    Comments: while on enoxaparin therapy    11/04/19 1753   11/11/19 0500  Creatinine, serum  (enoxaparin (LOVENOX)    CrCl >/= 30 ml/min)  Weekly,   R    Comments: while on enoxaparin  therapy    11/04/19 1753   11/05/19 0500  Comprehensive metabolic panel  Tomorrow morning,   R     11/04/19 1753   11/04/19 1752  HIV Antibody (routine testing w rflx)  (HIV Antibody (Routine testing w reflex) panel)  Once,   STAT     11/04/19 1753   11/04/19 1742  CBC  (enoxaparin (LOVENOX)    CrCl >/= 30 ml/min)  Once,   STAT    Comments: Baseline for enoxaparin therapy IF NOT ALREADY DRAWN.  Notify MD if PLT < 100 K.    11/04/19 1753   11/04/19 1742  Creatinine, serum  (enoxaparin (LOVENOX)    CrCl >/= 30 ml/min)  Once,   STAT    Comments: Baseline for enoxaparin therapy IF NOT ALREADY DRAWN.    11/04/19 1753   11/04/19 1741  HIV Antibody  (routine testing w rflx)  (HIV Antibody (Routine testing w reflex) panel)  Once,   STAT     11/04/19 1753   11/04/19 1716  Respiratory Panel by RT PCR (Flu A&B, Covid) - Nasopharyngeal Swab  (Tier 2 Respiratory Panel by RT PCR (Flu A&B, Covid) (TAT 2 hrs))  Once,   STAT    Question Answer Comment  Is this test for diagnosis or screening Screening   Symptomatic for COVID-19 as defined by CDC No   Hospitalized for COVID-19 No   Admitted to ICU for COVID-19 No   Previously tested for COVID-19 Yes   Resident in a congregate (group) care setting No   Employed in healthcare setting No   Pregnant No      11/04/19 1715   11/04/19 1458  Urinalysis, Routine w reflex microscopic  Once,   STAT     11/04/19 1459   11/04/19 1458  Urine culture  ONCE - STAT,   STAT     11/04/19 1459          Vitals/Pain Today's Vitals   11/04/19 1434 11/04/19 1653 11/04/19 1834  BP: 119/80 (!) 116/56 (!) 105/41  Pulse: 71 68 68  Resp: 18 18 18   Temp: 97.8 F (36.6 C)    TempSrc: Oral    SpO2: 96% 93% 96%  PainSc: 0-No pain      Isolation Precautions No active isolations  Medications Medications  sodium chloride flush (NS) 0.9 % injection 3 mL (has no administration in time range)  0.9 %  sodium chloride infusion (has no administration in time range)  acetaminophen (TYLENOL) tablet 650 mg (has no administration in time range)    Or  acetaminophen (TYLENOL) suppository 650 mg (has no administration in time range)  docusate sodium (COLACE) capsule 100 mg (has no administration in time range)  bisacodyl (DULCOLAX) suppository 10 mg (has no administration in time range)  ondansetron (ZOFRAN) tablet 4 mg (has no administration in time range)    Or  ondansetron (ZOFRAN) injection 4 mg (has no administration in time range)  enoxaparin (LOVENOX) injection 40 mg (has no administration in time range)  meropenem (MERREM) 1 g in sodium chloride 0.9 % 100 mL IVPB (has no administration in time range)  sodium  chloride 0.9 % bolus 1,000 mL (0 mLs Intravenous Stopped 11/04/19 1632)  sodium chloride 0.9 % bolus 1,000 mL (0 mLs Intravenous Stopped 11/04/19 1820)    Mobility walks with device Low fall risk   Focused Assessments Renal Assessment Handoff:           R Recommendations: See Admitting Provider Note  Report given  to:   Additional Notes:

## 2019-11-04 NOTE — ED Triage Notes (Signed)
EMS reports episode of slurred speech and weakness approx 1230 which has since fully resolved. Family wishes to have Pt evaluated. Pt denies continuing symptoms, pain, denies any other complaint.  BP 118/90 HR 67 RR 18 Sp02 94 RA CGB 202 Temp 97.5

## 2019-11-04 NOTE — ED Notes (Signed)
Amy Barnes, daughter wants a call with an update [phone] on her mother.

## 2019-11-04 NOTE — H&P (Signed)
History and Physical    Amy Barnes JKK:[id number] DOB: 12-09-1950 DOA: 11/04/2019  PCP: Amy Boyden, MD  Patient coming from:  Home   I have personally briefly reviewed patient's old medical records in [facility]  Chief Complaint:  Slurring of speech which resolved.  Generalized weakness, fatigue  HPI: Amy Barnes is a 69 y.o. female with past medical history significant for Bipolar affective disorder, CAD, COPD, depression and anxiety, GERD hyperlipidemia, obstructive sleep apnea, restless leg syndrome was brought from the home with slurred speech and generalized weakness which has resolved by the time patient arrived in the ED.  Family wanted patient to be evaluated.  She denies chest pain, shortness of breath, headache.  She denies any fever, sick contact, recent travel.  Patient reports she has been feeling weak and tired lately.  She had an episode of bilateral blurry vision and slight slurring of speech which resolved after few minutes.  She denies any urinary symptoms.   ED Course:  Vitals: Temp 97.8, HR 68, respiratory rate 18,  BP 116/ 56, oxygen saturation 93% Labs CBC WBC 11.0 hemoglobin 13.7 hematocrit 40.9 platelet 255 sodium 130 potassium 3.6 chloride 91 bicarb 27 BUN 34 creatinine 2.29 glucose 165 calcium 9.2 magnesium 2.1 lipase 71 UA positive for nitrates and leukocyte esterase. MRI :  No Acute abnormality, Chronic small vessel ischemic injury that is overall mild.   Review of Systems: As per HPI otherwise 10 point review of systems negative.  Review of Systems  Constitutional: Positive for malaise/fatigue.  HENT: Negative.   Eyes: Positive for blurred vision.  Respiratory: Negative.   Cardiovascular: Negative.   Gastrointestinal: Negative.   Genitourinary: Negative.   Musculoskeletal: Negative.   Neurological: Positive for dizziness.     Past Medical History:  Diagnosis Date  . Allergic rhinitis   . Ambulates with cane    straight  . Anemia   .  Anxiety   . Asthma    seasonal  . Bipolar affective disorder (HCC)    takes Synthroid meds for Bipolar  . CAD (coronary artery disease) 07/2016   by CT scan  . Carpal tunnel syndrome    had surgery but occasional still has some issues per patient  . Cataract   . Centrilobular emphysema (HCC) 07/2016   by CT scan - pt not aware of this  . Constipation due to pain medication   . Depression with anxiety   . Diabetes mellitus    type 2 - no meds diet controlled  . GERD (gastroesophageal reflux disease)   . History of blood transfusion   . 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 Hyperlipidemia   . Hypertension   . OSA (obstructive sleep apnea)    no longer using cpap, uses a bed that raises and lowers hob  . Osteoarthritis   . Osteoarthritis   . Osteopenia 08/11/2019   DEXA 07/2019 - T -1.1 R femur (osteopenia)  . Pneumonia   . Restless legs   . Septic arthritis (HCC) 10/11/2012  . Shingles 06/30/2016  . Shortness of breath    with exertion  . Status post revision of total hip replacement bilateral   prosthetic infection R 2013, L 2015  . SVD (spontaneous vaginal delivery)    x 3  . Thoracic aortic atherosclerosis (HCC) 11/207   by CT    Past Surgical History:  Procedure Laterality Date  . BREAST CYST ASPIRATION    .  CARPAL TUNNEL RELEASE Right 05/15/2011  . CARPAL TUNNEL RELEASE Left 12/24/2017   Procedure: LEFT CARPAL TUNNEL RELEASE;  Surgeon: Amy Loa, MD;  Location: [facility];  Service: Orthopedics;  Laterality: Left;  . CERVICAL FUSION  2012   C2/3/4  . COLONOSCOPY WITH PROPOFOL N/A 12/31/2015   diverticulosis, int hem, o/w normal rpt 10 yrs (Amy Barnes)  . EYE SURGERY Bilateral    cataract surgery with lens implant  . FOOT SURGERY  1982   bone spur  . I & D EXTREMITY  09/06/2012   Amy Palmer, MD; Right;  I&D of right thigh  . I & D EXTREMITY Left 07/2013   wound vac - daily doxycycline indefinitely  . KNEE  ARTHROSCOPY  09/01/2012   Amy Huh, MD;  Right  . LUMBAR FUSION  01/08/2012   L3-4  . LUMBAR FUSION  02/2019   unexpectedly discovered MRSA infection - pus Amy Barnes)   . LUMBAR LAMINECTOMY/DECOMPRESSION MICRODISCECTOMY N/A 11/13/2016   LAMINOTOMY/LAMINECTOMY LUMBAR FOUR LUMBAR FIVE  WITH RESECTION OF SYNOVIAL CYST;  Surgeon: Amy Stalker, MD  . NECK SURGERY     Herniated disk C2,3,4  . NOSE SURGERY    . PARTIAL HYSTERECTOMY  1984   for mennorhagia, ovaries remain  . REVISION TOTAL HIP ARTHROPLASTY Left 08/28/2011  . TMJ ARTHROPLASTY  1982  . TOTAL HIP ARTHROPLASTY Left 2002  . TRIGGER FINGER RELEASE Right 05/15/2011   long finger     reports that she quit smoking about 10 years ago. Her smoking use included cigarettes. She has a 60.00 pack-year smoking history. She has never used smokeless tobacco. She reports current alcohol use of about 21.0 standard drinks of alcohol per week. She reports that she does not use drugs.  Allergies  Allergen Reactions  . Cephalexin Hives  . Hydrocodone Other (See Comments)    Reaction:  Hallucinations   . Tolterodine Tartrate Nausea And Vomiting  . Risperidone And Related Other (See Comments)    Reaction:  Made pt excessively sleepy  . Seroquel [Quetiapine Fumarate] Other (See Comments)    Reaction:  Made pt excessively sleepy  . Sulfa Antibiotics Rash    Family History  Problem Relation Age of Onset  . Aneurysm Father 62       brain  . Alcohol abuse Father   . Cancer Father        possibly  . CAD Other        several siblings  . Cancer Brother        prostate  . Diabetes Brother   . Diabetes Sister   . Anesthesia problems Neg Hx   . Hypotension Neg Hx   . Malignant hyperthermia Neg Hx   . Pseudochol deficiency Neg Hx   . Breast cancer Neg Hx      Prior to Admission medications   Medication Sig Start Date End Date Taking? Authorizing Provider  albuterol (PROVENTIL HFA;VENTOLIN HFA) 108 (90 Base) MCG/ACT inhaler Inhale 2  puffs into the lungs every 6 (six) hours as needed for wheezing or shortness of breath. 12/14/18  Yes Amy Boyden, MD  amLODipine (NORVASC) 5 MG tablet Take 1 tablet (5 mg total) by mouth daily. 10/17/19  Yes Amy Boyden, MD  aspirin 81 MG EC tablet Take 81 mg by mouth at bedtime.    Yes [provider]  celecoxib (CELEBREX) 200 MG capsule TAKE ONE CAPSULE BY MOUTH DAILY 06/14/19  Yes Amy Boyden, MD  cyclobenzaprine (FLEXERIL) 10 MG tablet Take 10 mg  by mouth 3 (three) times daily as needed for muscle spasms.  05/29/19  Yes [provider]  doxycycline (VIBRA-TABS) 100 MG tablet Take 100 mg by mouth 2 (two) times daily. 07/31/19  Yes [provider]  FLUoxetine (PROZAC) 40 MG capsule Take by mouth daily. 03/08/19  Yes [provider]  Fluticasone-Salmeterol (ADVAIR) 250-50 MCG/DOSE AEPB Inhale 2 puffs into the lungs at bedtime. Patient taking differently: Inhale 2 puffs into the lungs at bedtime as needed (sob and wheezing).  11/03/19  Yes Amy Boyden, MD  furosemide (LASIX) 40 MG tablet Take 1 tablet (40 mg total) by mouth daily as needed for fluid. 11/03/19  Yes Amy Boyden, MD  lamoTRIgine (LAMICTAL) 200 MG tablet Take 200 mg by mouth daily.     Yes [provider]  levothyroxine (SYNTHROID) 150 MCG tablet Take 1 tablet (150 mcg total) by mouth at bedtime. 01/21/19  Yes Amy Boyden, MD  losartan (COZAAR) 100 MG tablet TAKE 1 TABLET BY MOUTH EVERY DAY 09/05/19  Yes Amy Boyden, MD  methocarbamol (ROBAXIN-750) 750 MG tablet Take 1 tablet (750 mg total) by mouth every 6 (six) hours as needed for muscle spasms. 03/25/19  Yes Bergman, Meghan D, NP  montelukast (SINGULAIR) 10 MG tablet TAKE 1 TABLET BY MOUTH EVERYDAY AT BEDTIME Patient taking differently: Take 10 mg by mouth at bedtime.  02/28/19  Yes Amy Boyden, MD  Multiple Vitamin (MULTIVITAMIN WITH MINERALS) TABS tablet Take 2 tablets by mouth daily.    Yes [provider]  nystatin (MYCOSTATIN/NYSTOP) powder Apply topically 4 (four) times daily. Patient taking differently: Apply 1 application topically 4 (four) times daily as needed (rash).  02/22/19  Yes Reva Bores, MD  ondansetron (ZOFRAN ODT) 8 MG disintegrating tablet Take 1 tablet (8 mg total) by mouth 2 (two) times daily as needed for nausea or vomiting. 11/02/19  Yes Amy Boyden, MD  Oxcarbazepine (TRILEPTAL) 300 MG tablet Take 300-600 mg by mouth See admin instructions. Take 300 mg by mouth in the morning and 600 mg at night   Yes [provider]  oxyCODONE 10 MG TABS Take 1 tablet (10 mg total) by mouth every 4 (four) hours as needed for severe pain ((score 7 to 10)). 03/25/19  Yes Val Eagle D, NP  pantoprazole (PROTONIX) 40 MG tablet Take 1 tablet (40 mg total) by mouth daily. Patient taking differently: Take 40 mg by mouth 2 (two) times daily.  01/21/19  Yes Amy Boyden, MD  promethazine (PHENERGAN) 25 MG tablet Take 25 mg by mouth every 6 (six) hours as needed for nausea or vomiting.   Yes [provider]  simvastatin (ZOCOR) 40 MG tablet Take 40 mg by mouth every evening.   Yes [provider]  sucralfate (CARAFATE) 1 g tablet Take 1 tablet (1 g total) by mouth 4 (four) times daily -  with meals and at bedtime. 11/03/19  Yes Amy Boyden, MD  traMADol (ULTRAM) 50 MG tablet Take 50-100 mg by mouth every 6 (six) hours as needed for moderate pain.  04/27/19  Yes [provider]  Cholecalciferol (VITAMIN D3) 25 MCG (1000 UT) CAPS Take 1 capsule (1,000 Units total) by mouth daily. Patient not taking: Reported on 11/04/2019 08/11/19   Amy Boyden, MD  ondansetron (ZOFRAN) 4 MG tablet Take 1 tablet (4 mg total) by mouth 2 (two) times daily. Patient not taking: Reported on 11/04/2019 05/19/19   Judyann Munson, MD  oxybutynin (DITROPAN-XL) 10 MG 24 hr tablet Take 1 tablet (  10 mg total) by mouth daily. 11/03/19   Festus Aloe, MD     Physical Exam: Vitals:   11/04/19 1434 11/04/19 1653  BP: 119/80 (!) 116/56  Pulse: 71 68  Resp: 18 18  Temp: 97.8 F (36.6 C)   TempSrc: Oral   SpO2: 96% 93%    Constitutional: NAD, calm, comfortable Vitals:   11/04/19 1434 11/04/19 1653  BP: 119/80 (!) 116/56  Pulse: 71 68  Resp: 18 18  Temp: 97.8 F (36.6 C)   TempSrc: Oral   SpO2: 96% 93%   Eyes: PERRL, lids and conjunctivae normal ENMT: Mucous membranes are moist. Posterior pharynx clear of any exudate or lesions.Normal dentition.  Neck: normal, supple, no masses, no thyromegaly Respiratory: clear to auscultation bilaterally, no wheezing, no crackles. Normal respiratory effort. No accessory muscle use.  Cardiovascular: Regular rate and rhythm, no murmurs / rubs / gallops. No extremity edema. 2+ pedal pulses. No carotid bruits.  Abdomen: no tenderness, no masses palpated. No hepatosplenomegaly. Bowel sounds positive.  Musculoskeletal: no clubbing / cyanosis. No joint deformity upper and lower extremities. Good ROM, no contractures. Normal muscle tone.  Skin: no rashes, lesions, ulcers. No induration Neurologic: CN 2-12 grossly intact. Sensation intact, DTR normal. Strength 5/5 in all 4.  Psychiatric: Normal judgment and insight. Alert and oriented x 3. Normal mood.    Labs on Admission: I have personally reviewed following labs and imaging studies  CBC: Recent Labs  Lab 10/30/19 1641 10/31/19 1353 11/04/19 1535  WBC 7.8 8.7 11.0*  NEUTROABS  --  5.7 7.6  HGB 14.2 14.2 13.7  HCT 42.8 42.4 40.9  MCV 83.9 83.6 84.9  PLT 299 279 696   Basic Metabolic Panel: Recent Labs  Lab 10/30/19 1641 10/31/19 1353 11/03/19 1301 11/04/19 1535  NA 128* 123* 126* 130*  K 3.9 3.6 3.5 3.6  CL 90* 89* 90* 91*  CO2 24 25 28 27   GLUCOSE 201* 211* 181* 165*  BUN 9 13 16  34*  CREATININE 0.55 0.64 0.94 2.29*  CALCIUM 9.6 9.6 9.2 9.2  MG  --   --   --  2.1   GFR: Estimated Creatinine Clearance: 37 mL/min (A) (by C-G  formula based on SCr of 2.29 mg/dL (H)). Liver Function Tests: Recent Labs  Lab 10/30/19 1641 10/31/19 1353 11/04/19 1535  AST 38 34 24  ALT 48* 43 34  ALKPHOS 99 90 86  BILITOT 0.8 0.7 0.9  PROT 8.0 7.8 7.3  ALBUMIN 4.6 4.4 4.4   Recent Labs  Lab 10/30/19 1641 10/31/19 1353 11/04/19 1535  LIPASE 23 24 71*   No results for input(s): AMMONIA in the last 168 hours. Coagulation Profile: No results for input(s): INR, PROTIME in the last 168 hours. Cardiac Enzymes: No results for input(s): CKTOTAL, CKMB, CKMBINDEX, TROPONINI in the last 168 hours. BNP (last 3 results) No results for input(s): PROBNP in the last 8760 hours. HbA1C: No results for input(s): HGBA1C in the last 72 hours. CBG: No results for input(s): GLUCAP in the last 168 hours. Lipid Profile: No results for input(s): CHOL, HDL, LDLCALC, TRIG, CHOLHDL, LDLDIRECT in the last 72 hours. Thyroid Function Tests: Recent Labs    11/04/19 1535  TSH 2.160   Anemia Panel: No results for input(s): VITAMINB12, FOLATE, FERRITIN, TIBC, IRON, RETICCTPCT in the last 72 hours. Urine analysis:    Component Value Date/Time   COLORURINE YELLOW 10/31/2019 1540   APPEARANCEUR HAZY (A) 10/31/2019 1540   APPEARANCEUR Clear 03/04/2019 0948   LABSPEC  1.010 10/31/2019 1540   LABSPEC 1.017 09/25/2012 2123   PHURINE 7.5 10/31/2019 1540   GLUCOSEU NEGATIVE 10/31/2019 1540   GLUCOSEU Negative 09/25/2012 2123   HGBUR TRACE (A) 10/31/2019 1540   HGBUR negative 05/17/2010 0946   BILIRUBINUR NEGATIVE 10/31/2019 1540   BILIRUBINUR Negative 03/04/2019 0948   BILIRUBINUR Negative 09/25/2012 2123   KETONESUR NEGATIVE 10/31/2019 1540   PROTEINUR NEGATIVE 10/31/2019 1540   UROBILINOGEN 0.2 01/14/2019 0934   UROBILINOGEN 1.0 08/30/2012 1511   NITRITE POSITIVE (A) 10/31/2019 1540   LEUKOCYTESUR NEGATIVE 10/31/2019 1540   LEUKOCYTESUR Negative 09/25/2012 2123    Radiological Exams on Admission: MR BRAIN WO CONTRAST  Result Date:  11/04/2019 CLINICAL DATA:  TIA.  Slurred speech and blurry vision. EXAM: MRI HEAD WITHOUT CONTRAST TECHNIQUE: Multiplanar, multiecho pulse sequences of the brain and surrounding structures were obtained without intravenous contrast. COMPARISON:  Head CT 05/26/2019 FINDINGS: Brain: No acute infarction, hemorrhage, hydrocephalus, extra-axial collection or mass lesion. 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 Wedge following the left basal ganglia is difficult to distinguish between a normal vessel and remote perforator infarct. Brain volume is normal. No acute infarct, hydrocephalus, collection, or masslike finding. Vascular: Normal flow voids Skull and upper cervical spine: Metal artifact near the left zygomatic arch. No evidence of marrow lesion. Sinuses/Orbits: Negative IMPRESSION: 1. Motion degraded brain MRI without acute finding. 2. 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 Electronically Signed   By: Marnee Spring M.D.   On: 11/04/2019 17:00    EKG: Independently reviewed.   Assessment/Plan Active Problems:   Acute kidney injury (HCC)  # Acute kidney injury secondary to dehydration / poor p.o. intake: Her baseline creatinine is 0.9, serum creatinine at admission 2.29. We will continue gentle hydration, recheck labs in the morning Avoid nephrotoxic medications.  # UTI: She denies any urinary symptoms. UA positive for nitrates and leukocyte esterase She has allergy to  cephalosporins,  Hives. Will consider aztreonam, consulted  pharmacy to dose. Follow-up urine cultures  #Transient slurring of speech: She reports slurring of  speech which resolved within few minutes. MRI negative for any acute stroke. Obtain 2D echo.  # Generalized weakness could be secondary to dehydration Continue IV fluids, gentle hydration. PT OT evaluation.  HTN: Hold losartan, continue Norvasc.  Hypothyroidism: Continue levothyroxine.  Major  depression and anxiety: Continue fluoxetine.  Chronic pain syndrome: Continue oxycodone methocarbamol.   DVT prophylaxis: Lovenox Code Status: Full code Family Communication: Discussed with daughter in detail over the phone Disposition Plan: Anticipated discharge home in 1 to 2 days Consults called: None Admission status: Admitted inpatient for acute kidney injury and dehydration.  Cipriano Bunker MD [facility]   If 7PM-7AM, please contact night-coverage [email or web address]   11/04/2019, 6:05 PM

## 2019-11-05 ENCOUNTER — Inpatient Hospital Stay (HOSPITAL_COMMUNITY): Payer: Medicare Other

## 2019-11-05 DIAGNOSIS — R55 Syncope and collapse: Secondary | ICD-10-CM

## 2019-11-05 LAB — URINALYSIS, ROUTINE W REFLEX MICROSCOPIC
Bilirubin Urine: NEGATIVE
Glucose, UA: NEGATIVE mg/dL
Ketones, ur: NEGATIVE mg/dL
Nitrite: NEGATIVE
Protein, ur: 30 mg/dL — AB
Specific Gravity, Urine: 1.02 (ref 1.005–1.030)
pH: 5 (ref 5.0–8.0)

## 2019-11-05 LAB — COMPREHENSIVE METABOLIC PANEL
ALT: 30 U/L (ref 0–44)
AST: 22 U/L (ref 15–41)
Albumin: 3.6 g/dL (ref 3.5–5.0)
Alkaline Phosphatase: 76 U/L (ref 38–126)
Anion gap: 11 (ref 5–15)
BUN: 39 mg/dL — ABNORMAL HIGH (ref 8–23)
CO2: 22 mmol/L (ref 22–32)
Calcium: 8.4 mg/dL — ABNORMAL LOW (ref 8.9–10.3)
Chloride: 93 mmol/L — ABNORMAL LOW (ref 98–111)
Creatinine, Ser: 2.26 mg/dL — ABNORMAL HIGH (ref 0.44–1.00)
GFR calc Af Amer: 25 mL/min — ABNORMAL LOW (ref >60)
GFR calc non Af Amer: 22 mL/min — ABNORMAL LOW (ref >60)
Glucose, Bld: 145 mg/dL — ABNORMAL HIGH (ref 70–99)
Potassium: 3.3 mmol/L — ABNORMAL LOW (ref 3.5–5.1)
Sodium: 126 mmol/L — ABNORMAL LOW (ref 135–145)
Total Bilirubin: 1 mg/dL (ref 0.3–1.2)
Total Protein: 6.2 g/dL — ABNORMAL LOW (ref 6.5–8.1)

## 2019-11-05 LAB — SEDIMENTATION RATE: Sed Rate: 11 mm/hr (ref 0–22)

## 2019-11-05 LAB — CBC
HCT: 40 % (ref 36.0–46.0)
Hemoglobin: 13.1 g/dL (ref 12.0–15.0)
MCH: 28.4 pg (ref 26.0–34.0)
MCHC: 32.8 g/dL (ref 30.0–36.0)
MCV: 86.8 fL (ref 80.0–100.0)
Platelets: 205 10*3/uL (ref 150–400)
RBC: 4.61 MIL/uL (ref 3.87–5.11)
RDW: 15.7 % — ABNORMAL HIGH (ref 11.5–15.5)
WBC: 8.7 10*3/uL (ref 4.0–10.5)
nRBC: 0 % (ref 0.0–0.2)

## 2019-11-05 LAB — HIV ANTIBODY (ROUTINE TESTING W REFLEX): HIV Screen 4th Generation wRfx: NONREACTIVE

## 2019-11-05 LAB — ECHOCARDIOGRAM COMPLETE
Height: 64 in
Weight: 5735.49 oz

## 2019-11-05 LAB — C-REACTIVE PROTEIN: CRP: 2.9 mg/dL — ABNORMAL HIGH (ref <1.0)

## 2019-11-05 LAB — GLUCOSE, CAPILLARY
Glucose-Capillary: 136 mg/dL — ABNORMAL HIGH (ref 70–99)
Glucose-Capillary: 136 mg/dL — ABNORMAL HIGH (ref 70–99)

## 2019-11-05 MED ORDER — PERFLUTREN LIPID MICROSPHERE
1.0000 mL | INTRAVENOUS | Status: AC | PRN
  Administered 2019-11-05: 2 mL via INTRAVENOUS
  Filled 2019-11-05: qty 10

## 2019-11-05 MED ORDER — ENOXAPARIN SODIUM 80 MG/0.8ML ~~LOC~~ SOLN
80.0000 mg | SUBCUTANEOUS | Status: DC
  Administered 2019-11-06: 80 mg via SUBCUTANEOUS
  Filled 2019-11-05: qty 0.8

## 2019-11-05 NOTE — Progress Notes (Signed)
Nutrition Brief Note  Patient identified on the Malnutrition Screening Tool (MST) Report  Wt Readings from Last 15 Encounters:  11/04/19 (!) 162.6 kg  11/03/19 (!) 163.4 kg  10/31/19 (!) 167.4 kg  10/21/19 (!) 174.2 kg  10/04/19 (!) 178.7 kg  08/29/19 (!) 168.3 kg  03/23/19 (!) 168.4 kg  03/18/19 (!) 168.4 kg  03/04/19 (!) 164.2 kg  03/02/19 (!) 164.3 kg  02/22/19 (!) 166.5 kg  01/21/19 (!) 157.4 kg  11/29/18 (!) 145.2 kg  11/09/18 (!) 145.2 kg  10/13/18 (!) 145.2 kg   Patient noted with recent 22.88 lb wt gain from 08/29/19 - 10/04/19.   UBW 164.3 kg - 168.4 kg (June 2020- Nov 2020)  Body mass index is 61.53 kg/m. Patient meets criteria for morbid obesity based on current BMI.   Obesity is a complex, chronic medical condition that is optimally managed by a multidisciplinary care team. Weight loss is not an idealgoal for an acute inpatient hospitalization. However, if further work-up for obesity is warranted, consider outpatient referral to outpatient bariatric service and/or [facility].  Current diet order is CM, patient is consuming approximately 100% of meals at this time. Labs and medications reviewed.   No nutrition interventions warranted at this time. If nutrition issues arise, please consult RD.   Lars Masson, RD, LDN Clinical Nutrition [facility] Telephone [phone] After Hours/Weekend Pager: [phone]

## 2019-11-05 NOTE — Progress Notes (Addendum)
PROGRESS NOTE  Amy Barnes ZOX:[id number] DOB: 13-Sep-1951 DOA: 11/04/2019 PCP: Ria Bush, MD  HPI/Recap of past 24 hours: Per admission HPI: Amy Barnes is a 69 y.o. female with past medical history significant for Bipolar affective disorder, CAD, COPD, depression and anxiety, GERD hyperlipidemia, obstructive sleep apnea, restless leg syndrome was brought from the home with slurred speech and generalized weakness which has resolved by the time patient arrived in the ED.  Family wanted patient to be evaluated.  She denies chest pain, shortness of breath, headache.  She denies any fever, sick contact, recent travel.  Patient reports she has been feeling weak and tired lately.  She had an episode of bilateral blurry vision and slight slurring of speech which resolved after few minutes.  She denies any urinary symptoms.   Subjective Patient seen and examined at bedside.  Daughter is at bedside  [name] patient has been having low blood pressures the latest one was 97/41 her amlodipine is being held she is on IV fluid at 70 mils per hour.  Assessment/Plan: Active Problems:   Acute kidney injury ([place])  1.  Hyp hypotension we will increase IV fluids 125 mils per hour.  We will continue to hold blood pressure medicine  2.  BMI of 61.5 dietitian has been consulted  3.  Acute kidney injury secondary to dehydration and poor p.o. intake her baseline creatinine 0.9 serum creatinine on admission is 2.29 we will continue gentle IV hydration.  We will avoid nephrotoxic medication  4.  Urinary tract infection she denies any urinary symptoms but UA is positive for nitrite and leukocyte esterase patient is currently on antibiotics will continue that and follow-up urine cultures.  5.  Transient slurring of speech.  Her MRI was negative for any acute stroke.  6.  Generalized weakness which could be due to dehydration and poor p.o. intake.  We will continue IV hydration and physical therapy evaluation  has been ordered  7.  History of hypertension her blood pressure is currently on hold due to low blood pressure  8.  Hypothyroidism continue levothyroxine  9..  Depression and anxiety continue fluoxetine  10.  Chronic pain syndrome.  Patient is status post back surgery with infection and she is currently on doxycycline at home 1 tablet twice a day as well as oxycodone and methocarbamol.  If blood pressure continues to be low we might need to discontinue both oxycodone and methocarbamol.      Code Status: Full  Severity of Illness: The appropriate patient status for this patient is INPATIENT. Inpatient status is judged to be reasonable and necessary in order to provide the required intensity of service to ensure the patient's safety. The patient's presenting symptoms, physical exam findings, and initial radiographic and laboratory data in the context of their chronic comorbidities is felt to place them at high risk for further clinical deterioration. Furthermore, it is not anticipated that the patient will be medically stable for discharge from the hospital within 2 midnights of admission. The following factors support the patient status of inpatient.   " Hypertension patient is still on IV antibiotics and IV fluid * I certify that at the point of admission it is my clinical judgment that the patient will require inpatient hospital care spanning beyond 2 midnights from the point of admission due to high intensity of service, high risk for further deterioration and high frequency of surveillance required.*    Family Communication: Daughter [name] at bedside  Disposition Plan: Home  when stable   Consultants:  Dietitian  Procedures:  None  Antimicrobials:  Maxipime  DVT prophylaxis: Lovenox   Objective: Vitals:   11/04/19 2010 11/04/19 2356 11/05/19 0000 11/05/19 0411  BP: (!) 122/49 (!) 90/42 (!) 91/40 (!) 119/46  Pulse: 71 73 69 69  Resp: 18 18    Temp: 97.8 F (36.6  C) 97.9 F (36.6 C)  97.7 F (36.5 C)  TempSrc: Oral Oral  Oral  SpO2: 99% 98%  95%  Weight:      Height:        Intake/Output Summary (Last 24 hours) at 11/05/2019 1000 Last data filed at 11/05/2019 0711 Gross per 24 hour  Intake 3837.19 ml  Output 130 ml  Net 3707.19 ml   Filed Weights   11/04/19 2007  Weight: (!) 162.6 kg   Body mass index is 61.53 kg/m.  Exam:  . General: 69 y.o. year-old female well developed well nourished in no acute distress.  Alert and oriented x3.  Morbidly obese . Cardiovascular: Regular rate and rhythm with no rubs or gallops.  No thyromegaly or JVD noted.   Marland Kitchen Respiratory: Clear to auscultation with no wheezes or rales. Good inspiratory effort. . Abdomen: Soft nontender nondistended with normal bowel sounds x4 quadrants. . Musculoskeletal: No lower extremity edema. 2/4 pulses in all 4 extremities. . Skin: No ulcerative lesions noted or rashes, . Psychiatry: Mood is appropriate for condition and setting    Data Reviewed: CBC: Recent Labs  Lab 10/30/19 1641 10/31/19 1353 11/04/19 1535 11/05/19 0510  WBC 7.8 8.7 11.0* 8.7  NEUTROABS  --  5.7 7.6  --   HGB 14.2 14.2 13.7 13.1  HCT 42.8 42.4 40.9 40.0  MCV 83.9 83.6 84.9 86.8  PLT 299 279 255 465   Basic Metabolic Panel: Recent Labs  Lab 10/30/19 1641 10/31/19 1353 11/03/19 1301 11/04/19 1535 11/05/19 0510  NA 128* 123* 126* 130* 126*  K 3.9 3.6 3.5 3.6 3.3*  CL 90* 89* 90* 91* 93*  CO2 '24 25 28 27 22  '$ GLUCOSE 201* 211* 181* 165* 145*  BUN '9 13 16 '$ 34* 39*  CREATININE 0.55 0.64 0.94 2.29* 2.26*  CALCIUM 9.6 9.6 9.2 9.2 8.4*  MG  --   --   --  2.1  --    GFR: Estimated Creatinine Clearance: 37.3 mL/min (A) (by C-G formula based on SCr of 2.26 mg/dL (H)). Liver Function Tests: Recent Labs  Lab 10/30/19 1641 10/31/19 1353 11/04/19 1535 11/05/19 0510  AST 38 34 24 22  ALT 48* 43 34 30  ALKPHOS 99 90 86 76  BILITOT 0.8 0.7 0.9 1.0  PROT 8.0 7.8 7.3 6.2*  ALBUMIN 4.6 4.4  4.4 3.6   Recent Labs  Lab 10/30/19 1641 10/31/19 1353 11/04/19 1535  LIPASE 23 24 71*   No results for input(s): AMMONIA in the last 168 hours. Coagulation Profile: No results for input(s): INR, PROTIME in the last 168 hours. Cardiac Enzymes: No results for input(s): CKTOTAL, CKMB, CKMBINDEX, TROPONINI in the last 168 hours. BNP (last 3 results) No results for input(s): PROBNP in the last 8760 hours. HbA1C: No results for input(s): HGBA1C in the last 72 hours. CBG: Recent Labs  Lab 11/05/19 0502 11/05/19 0807  GLUCAP 136* 136*   Lipid Profile: No results for input(s): CHOL, HDL, LDLCALC, TRIG, CHOLHDL, LDLDIRECT in the last 72 hours. Thyroid Function Tests: Recent Labs    11/04/19 1535  TSH 2.160   Anemia Panel: No results for input(s):  VITAMINB12, FOLATE, FERRITIN, TIBC, IRON, RETICCTPCT in the last 72 hours. Urine analysis:    Component Value Date/Time   COLORURINE AMBER (A) 11/05/2019 0004   APPEARANCEUR CLOUDY (A) 11/05/2019 0004   APPEARANCEUR Clear 03/04/2019 0948   LABSPEC 1.020 11/05/2019 0004   LABSPEC 1.017 09/25/2012 2123   PHURINE 5.0 11/05/2019 0004   GLUCOSEU NEGATIVE 11/05/2019 0004   GLUCOSEU Negative 09/25/2012 2123   HGBUR SMALL (A) 11/05/2019 0004   HGBUR negative 05/17/2010 0946   BILIRUBINUR NEGATIVE 11/05/2019 0004   BILIRUBINUR Negative 03/04/2019 0948   BILIRUBINUR Negative 09/25/2012 2123   KETONESUR NEGATIVE 11/05/2019 0004   PROTEINUR 30 (A) 11/05/2019 0004   UROBILINOGEN 0.2 01/14/2019 0934   UROBILINOGEN 1.0 08/30/2012 1511   NITRITE NEGATIVE 11/05/2019 0004   LEUKOCYTESUR MODERATE (A) 11/05/2019 0004   LEUKOCYTESUR Negative 09/25/2012 2123   Sepsis Labs: '@LABRCNTIP'$ (procalcitonin:4,lacticidven:4)  ) Recent Results (from the past 240 hour(s))  Urine Culture     Status: Abnormal   Collection Time: 10/30/19  7:21 PM   Specimen: Urine, Random  Result Value Ref Range Status   Specimen Description   Final    URINE,  RANDOM Performed at [facility], [address], [place], [place] [address]    Special Requests   Final    NONE Performed at [facility], [address] [place], [place] [address]    Culture (A)  Final    >=100,000 COLONIES/mL KLEBSIELLA OXYTOCA Confirmed Extended Spectrum Beta-Lactamase Producer (ESBL).  In bloodstream infections from ESBL organisms, carbapenems are preferred over piperacillin/tazobactam. They are shown to have a lower risk of mortality.    Report Status 11/01/2019 FINAL  Final   Organism ID, Bacteria KLEBSIELLA OXYTOCA (A)  Final      Susceptibility   Klebsiella oxytoca - MIC*    AMPICILLIN >=32 RESISTANT Resistant     CEFAZOLIN >=64 RESISTANT Resistant     CEFTRIAXONE 32 RESISTANT Resistant     CIPROFLOXACIN <=0.25 SENSITIVE Sensitive     GENTAMICIN <=1 SENSITIVE Sensitive     IMIPENEM <=0.25 SENSITIVE Sensitive     NITROFURANTOIN 32 SENSITIVE Sensitive     TRIMETH/SULFA <=20 SENSITIVE Sensitive     AMPICILLIN/SULBACTAM >=32 RESISTANT Resistant     PIP/TAZO >=128 RESISTANT Resistant     * >=100,000 COLONIES/mL KLEBSIELLA OXYTOCA  SARS Coronavirus 2 Ag (30 min TAT) - Nasal Swab (BD Veritor Kit)     Status: None   Collection Time: 10/31/19  2:00 PM   Specimen: Nasal Swab (BD Veritor Kit)  Result Value Ref Range Status   SARS Coronavirus 2 Ag NEGATIVE NEGATIVE Final    Comment: (NOTE) SARS-CoV-2 antigen NOT DETECTED.  Negative results are presumptive.  Negative results do not preclude SARS-CoV-2 infection and should not be used as the sole basis for treatment or other patient management decisions, including infection  control decisions, particularly in the presence of clinical signs and  symptoms consistent with COVID-19, or in those who have been in contact with the virus.  Negative results must be combined with clinical observations, patient history, and epidemiological information. The expected result is Negative. Fact Sheet for  Patients: PodPark.tn Fact Sheet for Healthcare Providers: GiftContent.is This test is not yet approved or cleared by the [place] FDA and  has been authorized for detection and/or diagnosis of SARS-CoV-2 by FDA under an Emergency Use Authorization (EUA).  This EUA will remain in effect (meaning this test can be used) for the duration of  the COVID-19 de claration under  Section 564(b)(1) of the Act, 21 U.S.C. section 360bbb-3(b)(1), unless the authorization is terminated or revoked sooner. Performed at [facility], [address] [place], [place] [address]   Respiratory Panel by RT PCR (Flu A&B, Covid) - Nasopharyngeal Swab     Status: None   Collection Time: 11/04/19  6:29 PM   Specimen: Nasopharyngeal Swab  Result Value Ref Range Status   SARS Coronavirus 2 by RT PCR NEGATIVE NEGATIVE Final    Comment: (NOTE) SARS-CoV-2 target nucleic acids are NOT DETECTED. The SARS-CoV-2 RNA is generally detectable in upper respiratoy specimens during the acute phase of infection. The lowest concentration of SARS-CoV-2 viral copies this assay can detect is 131 copies/mL. A negative result does not preclude SARS-Cov-2 infection and should not be used as the sole basis for treatment or other patient management decisions. A negative result may occur with  improper specimen collection/handling, submission of specimen other than nasopharyngeal swab, presence of viral mutation(s) within the areas targeted by this assay, and inadequate number of viral copies (<131 copies/mL). A negative result must be combined with clinical observations, patient history, and epidemiological information. The expected result is Negative. Fact Sheet for Patients:  PinkCheek.be Fact Sheet for Healthcare Providers:  GravelBags.it This test is not yet ap proved or cleared by the [place]  FDA and  has been authorized for detection and/or diagnosis of SARS-CoV-2 by FDA under an Emergency Use Authorization (EUA). This EUA will remain  in effect (meaning this test can be used) for the duration of the COVID-19 declaration under Section 564(b)(1) of the Act, 21 U.S.C. section 360bbb-3(b)(1), unless the authorization is terminated or revoked sooner.    Influenza A by PCR NEGATIVE NEGATIVE Final   Influenza B by PCR NEGATIVE NEGATIVE Final    Comment: (NOTE) The Xpert Xpress SARS-CoV-2/FLU/RSV assay is intended as an aid in  the diagnosis of influenza from Nasopharyngeal swab specimens and  should not be used as a sole basis for treatment. Nasal washings and  aspirates are unacceptable for Xpert Xpress SARS-CoV-2/FLU/RSV  testing. Fact Sheet for Patients: PinkCheek.be Fact Sheet for Healthcare Providers: GravelBags.it This test is not yet approved or cleared by the [place] FDA and  has been authorized for detection and/or diagnosis of SARS-CoV-2 by  FDA under an Emergency Use Authorization (EUA). This EUA will remain  in effect (meaning this test can be used) for the duration of the  Covid-19 declaration under Section 564(b)(1) of the Act, 21  U.S.C. section 360bbb-3(b)(1), unless the authorization is  terminated or revoked. Performed at [facility] [place] [address] [place], [place] [address]       Studies: MR BRAIN WO CONTRAST  Result Date: 11/04/2019 CLINICAL DATA:  TIA.  Slurred speech and blurry vision. EXAM: MRI HEAD WITHOUT CONTRAST TECHNIQUE: Multiplanar, multiecho pulse sequences of the brain and surrounding structures were obtained without intravenous contrast. COMPARISON:  Head CT 05/26/2019 FINDINGS: Brain: No acute infarction, hemorrhage, hydrocephalus, extra-axial collection or mass lesion. 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  Wedge following the left basal ganglia is difficult to distinguish between a normal vessel and remote perforator infarct. Brain volume is normal. No acute infarct, hydrocephalus, collection, or masslike finding. Vascular: Normal flow voids Skull and upper cervical spine: Metal artifact near the left zygomatic arch. No evidence of marrow lesion. Sinuses/Orbits: Negative IMPRESSION: 1. Motion degraded brain MRI without acute finding. 2. Chronic small vessel ischemic injury that is overall  mild. Electronically Signed   By: Monte Fantasia M.D.   On: 11/04/2019 17:00    Scheduled Meds: . amLODipine  5 mg Oral Daily  . aspirin EC  81 mg Oral QHS  . cholecalciferol  1,000 Units Oral Daily  . docusate sodium  100 mg Oral BID  . enoxaparin (LOVENOX) injection  80 mg Subcutaneous Q12H  . FLUoxetine  40 mg Oral Daily  . fluticasone furoate-vilanterol  1 puff Inhalation Daily  . lamoTRIgine  200 mg Oral Daily  . levothyroxine  150 mcg Oral QHS  . montelukast  10 mg Oral QHS  . OXcarbazepine  300 mg Oral q morning - 10a  . OXcarbazepine  600 mg Oral QHS  . oxybutynin  10 mg Oral Daily  . pantoprazole  40 mg Oral Daily  . simvastatin  40 mg Oral QPM  . sodium chloride flush  3 mL Intravenous Q12H  . sucralfate  1 g Oral TID WC & HS    Continuous Infusions: . sodium chloride 75 mL/hr at 11/05/19 0200  . meropenem (MERREM) IV Stopped (11/04/19 2200)     LOS: 1 day     Cristal Deer, MD [facility]  To reach me or the doctor on call, go to: [email or web address] Password [email or web address]  11/05/2019, 10:00 AM

## 2019-11-05 NOTE — Evaluation (Signed)
Physical Therapy Evaluation Patient Details Name: Amy Barnes MRN: [id number] DOB: [birth year] Today's Date: 11/05/2019   History of Present Illness  Amy Barnes is a 69 y.o. female who presents to [facility] with slurred speech, generalized weakness, ARF. PMH of Bipolar affective disorder, CAD, COPD, depression and anxiety, GERD hyperlipidemia, obstructive sleep apnea, and restless leg syndrome  Clinical Impression   Pt presents with generalized LE weakness, increased time and effort to mobilize, decreased tolerance for activity, fair standing balance. Pt to benefit from acute PT to address deficits. Pt ambulated hallway distance with use of cane and intermittent HHA from PT, per pt she typically uses 2 canes for ambulation. PT recommending OPPT for weakness, balance impairments with history of 1 fall in the past year, and chronic LE and back pain. PT to progress mobility as tolerated, and will continue to follow acutely.      Follow Up Recommendations Outpatient PT;Supervision for mobility/OOB    Equipment Recommendations  None recommended by PT    Recommendations for Other Services       Precautions / Restrictions Precautions Precautions: None Restrictions Weight Bearing Restrictions: No      Mobility  Bed Mobility Overal bed mobility: Modified Independent             General bed mobility comments: Mod I for use of bedrails, HOB elevation, and increased time to perform. No physical assist required for entering/exiting bed. Pt has elevating HOB at home.  Transfers Overall transfer level: Needs assistance Equipment used: Straight cane Transfers: Sit to/from Stand Sit to Stand: Min guard         General transfer comment: Min guard for safety, pt with increased time to rise and self-steady.  Ambulation/Gait Ambulation/Gait assistance: Min guard;Supervision Gait Distance (Feet): 160 Feet Assistive device: 1 person hand held assist;Straight cane Gait  Pattern/deviations: Step-through pattern;Decreased stride length Gait velocity: decr   General Gait Details: Min guard to supervision for safety and HHA as needed. No physical assist required, HRmax 109 bpm during ambulation. Verbal cuing for placement of cane when ambulating.  Stairs            Wheelchair Mobility    Modified Rankin (Stroke Patients Only)       Balance Overall balance assessment: Needs assistance   Sitting balance-Leahy Scale: Fair     Standing balance support: Bilateral upper extremity supported Standing balance-Leahy Scale: Poor Standing balance comment: reliant on external support in dynamic standing                             Pertinent Vitals/Pain Pain Assessment: Faces Faces Pain Scale: Hurts a little bit Pain Location: R knee with mobility, back (chronic) Pain Descriptors / Indicators: Discomfort Pain Intervention(s): Limited activity within patient's tolerance;Monitored during session;Repositioned    Home Living Family/patient expects to be discharged to:: Private residence Living Arrangements: Spouse/significant other;Children Available Help at Discharge: Family;Friend(s) Type of Home: House Home Access: Ramped entrance     Home Layout: Two level Home Equipment: Environmental consultant - 2 wheels;Cane - single point;Bedside commode;Shower seat;Electric scooter;Wheelchair - Building surveyor      Prior Function Level of Independence: Independent with assistive device(s)         Comments: pt reports using dual canes for ambulation PTA, as needed. Pt states that she does not always use AD in her home. Pt does not drive, per daughter in room "we take her to her appointments"  Hand Dominance   Dominant Hand: Right    Extremity/Trunk Assessment   Upper Extremity Assessment Upper Extremity Assessment: Defer to OT evaluation    Lower Extremity Assessment Lower Extremity Assessment: Generalized weakness    Cervical /  Trunk Assessment Cervical / Trunk Assessment: Normal  Communication   Communication: No difficulties  Cognition Arousal/Alertness: Awake/alert Behavior During Therapy: WFL for tasks assessed/performed Overall Cognitive Status: Within Functional Limits for tasks assessed                                        General Comments      Exercises     Assessment/Plan    PT Assessment Patient needs continued PT services  PT Problem List Decreased strength;Decreased mobility;Decreased activity tolerance;Decreased balance;Decreased knowledge of use of DME;Pain;Obesity       PT Treatment Interventions DME instruction;Therapeutic activities;Gait training;Therapeutic exercise;Patient/family education;Functional mobility training;Balance training    PT Goals (Current goals can be found in the Care Plan section)  Acute Rehab PT Goals Patient Stated Goal: return home PT Goal Formulation: With patient Time For Goal Achievement: 11/19/19 Potential to Achieve Goals: Good    Frequency Min 3X/week   Barriers to discharge        Co-evaluation               AM-PAC PT "6 Clicks" Mobility  Outcome Measure Help needed turning from your back to your side while in a flat bed without using bedrails?: A Little Help needed moving from lying on your back to sitting on the side of a flat bed without using bedrails?: A Little Help needed moving to and from a bed to a chair (including a wheelchair)?: A Little Help needed standing up from a chair using your arms (e.g., wheelchair or bedside chair)?: A Little Help needed to walk in hospital room?: A Little Help needed climbing 3-5 steps with a railing? : A Lot 6 Click Score: 17    End of Session   Activity Tolerance: Patient tolerated treatment well;Patient limited by fatigue Patient left: in bed;with call bell/phone within reach;with family/visitor present Nurse Communication: Mobility status PT Visit Diagnosis: Muscle  weakness (generalized) (M62.81);Difficulty in walking, not elsewhere classified (R26.2)    Time: 1610-9604 PT Time Calculation (min) (ACUTE ONLY): 19 min   Charges:   PT Evaluation $PT Eval Low Complexity: 1 Low          Timoth Schara E, PT Acute Rehabilitation Services Pager [phone]  Office [phone]   Treasa Bradshaw D Thandiwe Siragusa 11/05/2019, 11:23 AM

## 2019-11-05 NOTE — Progress Notes (Signed)
Occupational Therapy Evaluation Patient Details Name: Amy Barnes MRN: [id number] DOB: January 24, 1951 Today's Date: 11/05/2019    History of Present Illness  Amy Barnes is a 69 y.o. female who presents to [facility] with slurred speech and generalized weakness, with a PMH of Bipolar affective disorder, CAD, COPD, depression and anxiety, GERD hyperlipidemia, obstructive sleep apnea, and restless leg syndrome   Clinical Impression   PTA pt PLOF living at home with family, requiring assistance as needed with several AE. Pt currently limited due to weakness and instability with functional  Transfer/mobility. Pt will benefit from additional acute OT to address strength, safety with ADL, and functional mobility to maximize independence prior to dc setting. OT will continue to follow acutely.     Follow Up Recommendations  No OT follow up;Supervision - Intermittent    Equipment Recommendations       Recommendations for Other Services       Precautions / Restrictions Precautions Precautions: None Restrictions Weight Bearing Restrictions: No      Mobility Bed Mobility Overal bed mobility: Modified Independent             General bed mobility comments: with use R bed rail to elevate trunk from supine to sit, pt reports some assistance with R leg in entering bed only as needed.   Transfers Overall transfer level: Needs assistance Equipment used: Rolling walker (2 wheeled) Transfers: Sit to/from Stand Sit to Stand: Min guard;Min assist         General transfer comment: no physical assistance required, Min A for safety with stability.    Balance Overall balance assessment: Needs assistance   Sitting balance-Leahy Scale: Fair     Standing balance support: Bilateral upper extremity supported Standing balance-Leahy Scale: Poor Standing balance comment: reliant of UE with RW                           ADL either performed or assessed with clinical judgement   ADL  Overall ADL's : Needs assistance/impaired Eating/Feeding: Modified independent;Sitting   Grooming: Wash/dry hands;Wash/dry face;Oral care;Modified independent   Upper Body Bathing: Independent   Lower Body Bathing: Minimal assistance(with AE)   Upper Body Dressing : Independent   Lower Body Dressing: Minimal assistance Lower Body Dressing Details (indicate cue type and reason): with AE Toilet Transfer: Min guard;Minimal assistance;Stand-pivot Toilet Transfer Details (indicate cue type and reason): simulated toilet transfer from bed <> bed with RW, no physical assistance required only for safety.         Functional mobility during ADLs: Min guard;Minimal assistance;Rolling walker General ADL Comments: Pt requires use of AE for LB ADLs in home environment, I to Mod I for majority with ADLs with use of AE as needed. education for safety with AE in home environment.      Vision Baseline Vision/History: (contacts)       Perception     Praxis      Pertinent Vitals/Pain Pain Assessment: No/denies pain     Hand Dominance Right   Extremity/Trunk Assessment Upper Extremity Assessment Upper Extremity Assessment: Overall WFL for tasks assessed   Lower Extremity Assessment Lower Extremity Assessment: Defer to PT evaluation       Communication Communication Communication: No difficulties   Cognition Arousal/Alertness: Awake/alert Behavior During Therapy: WFL for tasks assessed/performed Overall Cognitive Status: Within Functional Limits for tasks assessed  General Comments       Exercises     Shoulder Instructions      Home Living Family/patient expects to be discharged to:: Private residence Living Arrangements: Spouse/significant other;Children Available Help at Discharge: Family;Friend(s) Type of Home: House Home Access: Ramped entrance     Home Layout: Two level     Bathroom Shower/Tub: Chiropodist Accessibility: Yes How Accessible: Accessible via walker Home Equipment: Walker - 2 wheels;Cane - single point;Bedside commode;Shower seat;Electric scooter;Wheelchair - Civil engineer, contracting: [employer]        Prior Functioning/Environment Level of Independence: Independent with assistive device(s)        Comments: Pt using AE for LB ADL tasks        OT Problem List: Decreased strength;Impaired balance (sitting and/or standing);Decreased safety awareness      OT Treatment/Interventions: Self-care/ADL training;Therapeutic exercise;DME and/or AE instruction;Therapeutic activities;Patient/family education;Balance training    OT Goals(Current goals can be found in the care plan section) Acute Rehab OT Goals Patient Stated Goal: return home OT Goal Formulation: With patient Time For Goal Achievement: 11/19/19 Potential to Achieve Goals: Fair  OT Frequency: Min 2X/week   Barriers to D/C:            Co-evaluation              AM-PAC OT "6 Clicks" Daily Activity     Outcome Measure Help from another person eating meals?: None Help from another person taking care of personal grooming?: None Help from another person toileting, which includes using toliet, bedpan, or urinal?: A Little Help from another person bathing (including washing, rinsing, drying)?: A Little Help from another person to put on and taking off regular upper body clothing?: None Help from another person to put on and taking off regular lower body clothing?: A Little 6 Click Score: 21   End of Session Equipment Utilized During Treatment: Gait belt;Rolling walker Nurse Communication: Mobility status  Activity Tolerance: Patient tolerated treatment well Patient left: in bed;with call bell/phone within reach;with bed alarm set  OT Visit Diagnosis: Unsteadiness on feet (R26.81);Muscle weakness (generalized) (M62.81)                Time: 9476-5465 OT Time Calculation  (min): 19 min Charges:  OT General Charges $OT Visit: 1 Visit OT Evaluation $OT Eval Low Complexity: 1 Low  [place] Old, MSOT, OTR/L  Supplemental Rehabilitation Services  [phone]   Zigmund Daniel 11/05/2019, 9:45 AM

## 2019-11-05 NOTE — Progress Notes (Signed)
  Echocardiogram 2D Echocardiogram has been performed.  Amy Barnes Amy Barnes 11/05/2019, 12:17 PM

## 2019-11-06 LAB — BASIC METABOLIC PANEL
Anion gap: 8 (ref 5–15)
BUN: 28 mg/dL — ABNORMAL HIGH (ref 8–23)
CO2: 25 mmol/L (ref 22–32)
Calcium: 8.8 mg/dL — ABNORMAL LOW (ref 8.9–10.3)
Chloride: 102 mmol/L (ref 98–111)
Creatinine, Ser: 0.94 mg/dL (ref 0.44–1.00)
GFR calc Af Amer: >60 mL/min (ref >60)
GFR calc non Af Amer: >60 mL/min (ref >60)
Glucose, Bld: 172 mg/dL — ABNORMAL HIGH (ref 70–99)
Potassium: 4.1 mmol/L (ref 3.5–5.1)
Sodium: 135 mmol/L (ref 135–145)

## 2019-11-06 LAB — URINE CULTURE

## 2019-11-06 MED ORDER — POTASSIUM CHLORIDE 20 MEQ PO PACK
40.0000 meq | PACK | Freq: Once | ORAL | Status: AC
  Administered 2019-11-06: 40 meq via ORAL
  Filled 2019-11-06: qty 2

## 2019-11-06 MED ORDER — DOCUSATE SODIUM 100 MG PO CAPS: 100.0000 mg | ORAL_CAPSULE | Freq: Two times a day (BID) | ORAL | 0 refills | Status: DC

## 2019-11-06 NOTE — Discharge Summary (Signed)
Discharge Summary  Amy Barnes DJS:[id number] DOB: 03/18/1951  PCP: Ria Bush, MD  Admit date: 11/04/2019 Discharge date: 11/06/2019  Time spent: Less than 30 minutes  Recommendations for Outpatient Follow-up:  1. Primary care provider  Discharge Diagnoses:  Active Hospital Problems   Diagnosis Date Noted  . Acute kidney injury [facility]) 11/04/2019    Resolved Hospital Problems  No resolved problems to display.    Discharge Condition: Improved  Diet recommendation: Reduced calorie diet  Vitals:   11/05/19 2201 11/06/19 0613  BP: (!) 117/54 (!) 121/46  Pulse: 76 78  Resp: 18 17  Temp: 97.6 F (36.4 C) 97.8 F (36.6 C)  SpO2: 97% 96%    History of present illness:  Amy Barnes a 69 y.o.femalewithpastmedical history significantfor Bipolar affective disorder, CAD, COPD, depression and anxiety, GERD hyperlipidemia,obstructive sleep apnea,restless leg syndrome was brought from the home with slurred speech and generalized weakness which has resolved by the time patient arrived in the ED. Family wanted patient to be evaluated. She denies chestpain, shortness of breath, headache. She denies any fever, sick contact, recent travel. Patient reportsshe has been feeling weak and tired lately. She had an episode of bilateral blurry vision and slight slurring of speech which resolved after few minutes.She denies any urinary symptoms.  Hospital Course:  Active Problems:   Acute kidney injury ([place])  1.  Hyp hypotension we will increase IV fluids 125 mils per hour.  We will continue to hold blood pressure medicine  2.  BMI of 61.5 dietitian has been consulted  3.  Acute kidney injury secondary to dehydration and poor p.o. intake her baseline creatinine 0.9 serum creatinine on admission is 2.29 we will continue gentle IV hydration.  We will avoid nephrotoxic medication  4.  Urinary tract infection she denies any urinary symptoms but UA is positive for nitrite  and leukocyte esterase patient is currently on antibiotics will continue that and follow-up urine cultures.  5.  Transient slurring of speech.  Her MRI was negative for any acute stroke.  6.  Generalized weakness which could be due to dehydration and poor p.o. intake.  We will continue IV hydration and physical therapy evaluation has been ordered  7.  History of hypertension her blood pressure is currently on hold due to low blood pressure  8.  Hypothyroidism continue levothyroxine  9..  Depression and anxiety continue fluoxetine  10.  Chronic pain syndrome.  Patient is status post back surgery with infection and she is currently on doxycycline at home 1 tablet twice a day as well as oxycodone and methocarbamol.  If blood pressure continues to be low we might need to discontinue both oxycodone and methocarbamol.     Procedures:  None  Consultations:  None  Discharge Exam: BP (!) 121/46 (BP Location: Right Arm)   Pulse 78   Temp 97.8 F (36.6 C)   Resp 17   Ht _0  (1.626 m)   Wt (!) 166.8 kg   LMP  (LMP Unknown)   SpO2 96%   BMI 63.13 kg/m   General: Alert oriented x3 morbidly obese Cardiovascular: Regular rate and rhythm no murmur no edema Respiratory: Clear to auscultation bilaterally  Discharge Instructions You were cared for by a hospitalist during your hospital stay. If you have any questions about your discharge medications or the care you received while you were in the hospital after you are discharged, you can call the unit and asked to speak with the hospitalist on call if  the hospitalist that took care of you is not available. Once you are discharged, your primary care physician will handle any further medical issues. Please note that NO REFILLS for any discharge medications will be authorized once you are discharged, as it is imperative that you return to your primary care physician (or establish a relationship with a primary care physician if you do  not have one) for your aftercare needs so that they can reassess your need for medications and monitor your lab values.  Discharge Instructions    Call MD for:  temperature >100.4   Complete by: As directed    Diet - low sodium heart healthy   Complete by: As directed    Discharge instructions   Complete by: As directed    Continue home doxycycline 100 mg twice a day for at least 5 days to complete antibiotic for UTI   Increase activity slowly   Complete by: As directed      Allergies as of 11/06/2019      Reactions   Cephalexin Hives   Hydrocodone Other (See Comments)   Reaction:  Hallucinations    Tolterodine Tartrate Nausea And Vomiting   Risperidone And Related Other (See Comments)   Reaction:  Made pt excessively sleepy   Seroquel [quetiapine Fumarate] Other (See Comments)   Reaction:  Made pt excessively sleepy   Sulfa Antibiotics Rash      Medication List    STOP taking these medications   amLODipine 5 MG tablet Commonly known as: NORVASC   cyclobenzaprine 10 MG tablet Commonly known as: FLEXERIL   furosemide 40 MG tablet Commonly known as: LASIX   losartan 100 MG tablet Commonly known as: COZAAR   ondansetron 4 MG tablet Commonly known as: Zofran   promethazine 25 MG tablet Commonly known as: PHENERGAN     TAKE these medications   albuterol 108 (90 Base) MCG/ACT inhaler Commonly known as: VENTOLIN HFA Inhale 2 puffs into the lungs every 6 (six) hours as needed for wheezing or shortness of breath.   aspirin 81 MG EC tablet Take 81 mg by mouth at bedtime.   celecoxib 200 MG capsule Commonly known as: CELEBREX TAKE ONE CAPSULE BY MOUTH DAILY   docusate sodium 100 MG capsule Commonly known as: COLACE Take 1 capsule (100 mg total) by mouth 2 (two) times daily.   doxycycline 100 MG tablet Commonly known as: VIBRA-TABS Take 100 mg by mouth 2 (two) times daily.   FLUoxetine 40 MG capsule Commonly known as: PROZAC Take by mouth daily.     Fluticasone-Salmeterol 250-50 MCG/DOSE Aepb Commonly known as: ADVAIR Inhale 2 puffs into the lungs at bedtime. What changed:   when to take this  reasons to take this   lamoTRIgine 200 MG tablet Commonly known as: LAMICTAL Take 200 mg by mouth daily.   levothyroxine 150 MCG tablet Commonly known as: SYNTHROID Take 1 tablet (150 mcg total) by mouth at bedtime.   methocarbamol 750 MG tablet Commonly known as: Robaxin-750 Take 1 tablet (750 mg total) by mouth every 6 (six) hours as needed for muscle spasms.   montelukast 10 MG tablet Commonly known as: SINGULAIR TAKE 1 TABLET BY MOUTH EVERYDAY AT BEDTIME What changed: See the new instructions.   multivitamin with minerals Tabs tablet Take 2 tablets by mouth daily.   nystatin powder Commonly known as: MYCOSTATIN/NYSTOP Apply topically 4 (four) times daily. What changed:   how much to take  when to take this  reasons to take this  ondansetron 8 MG disintegrating tablet Commonly known as: Zofran ODT Take 1 tablet (8 mg total) by mouth 2 (two) times daily as needed for nausea or vomiting.   Oxcarbazepine 300 MG tablet Commonly known as: TRILEPTAL Take 300-600 mg by mouth See admin instructions. Take 300 mg by mouth in the morning and 600 mg at night   oxybutynin 10 MG 24 hr tablet Commonly known as: DITROPAN-XL Take 1 tablet (10 mg total) by mouth daily.   Oxycodone HCl 10 MG Tabs Take 1 tablet (10 mg total) by mouth every 4 (four) hours as needed for severe pain ((score 7 to 10)).   pantoprazole 40 MG tablet Commonly known as: PROTONIX Take 1 tablet (40 mg total) by mouth daily. What changed: when to take this   simvastatin 40 MG tablet Commonly known as: ZOCOR Take 40 mg by mouth every evening.   sucralfate 1 g tablet Commonly known as: Carafate Take 1 tablet (1 g total) by mouth 4 (four) times daily -  with meals and at bedtime.   traMADol 50 MG tablet Commonly known as: ULTRAM Take 50-100 mg by  mouth every 6 (six) hours as needed for moderate pain.   Vitamin D3 25 MCG (1000 UT) Caps Take 1 capsule (1,000 Units total) by mouth daily.      Allergies  Allergen Reactions  . Cephalexin Hives  . Hydrocodone Other (See Comments)    Reaction:  Hallucinations   . Tolterodine Tartrate Nausea And Vomiting  . Risperidone And Related Other (See Comments)    Reaction:  Made pt excessively sleepy  . Seroquel [Quetiapine Fumarate] Other (See Comments)    Reaction:  Made pt excessively sleepy  . Sulfa Antibiotics Rash      The results of significant diagnostics from this hospitalization (including imaging, microbiology, ancillary and laboratory) are listed below for reference.    Significant Diagnostic Studies: MR BRAIN WO CONTRAST  Result Date: 11/04/2019 CLINICAL DATA:  TIA.  Slurred speech and blurry vision. EXAM: MRI HEAD WITHOUT CONTRAST TECHNIQUE: Multiplanar, multiecho pulse sequences of the brain and surrounding structures were obtained without intravenous contrast. COMPARISON:  Head CT 05/26/2019 FINDINGS: Brain: No acute infarction, hemorrhage, hydrocephalus, extra-axial collection or mass lesion. 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 Wedge following the left basal ganglia is difficult to distinguish between a normal vessel and remote perforator infarct. Brain volume is normal. No acute infarct, hydrocephalus, collection, or masslike finding. Vascular: Normal flow voids Skull and upper cervical spine: Metal artifact near the left zygomatic arch. No evidence of marrow lesion. Sinuses/Orbits: Negative IMPRESSION: 1. Motion degraded brain MRI without acute finding. 2. 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 Electronically Signed   By: Monte Fantasia M.D.   On: 11/04/2019 17:00   CT ABDOMEN PELVIS W CONTRAST  Result Date: 10/31/2019 CLINICAL DATA:  Nausea and vomiting and abdominal cramping for 3 days. EXAM: CT ABDOMEN  AND PELVIS WITH CONTRAST TECHNIQUE: Multidetector CT imaging of the abdomen and pelvis was performed using the standard protocol following bolus administration of intravenous contrast. CONTRAST:  186m OMNIPAQUE IOHEXOL 300 MG/ML  SOLN COMPARISON:  CT scan dated 11/27/2015 FINDINGS: Lower chest: Extensive chronic stable calcification in the heart. Hepatobiliary: No focal liver abnormality is seen. No gallstones, gallbladder wall thickening, or biliary dilatation. Pancreas: Unremarkable. No pancreatic ductal dilatation or surrounding inflammatory changes. Spleen: Normal in size without focal abnormality. Adrenals/Urinary Tract: Tiny stone in the mid right kidney. The kidneys are  otherwise normal. No hydronephrosis. The visualized portion of the bladder is normal. Left hip prosthesis obscures some detail of the bladder. Stomach/Bowel: Stomach is within normal limits. Appendix appears normal. No evidence of bowel wall thickening, distention, or inflammatory changes. Vascular/Lymphatic: Aortic atherosclerosis. No enlarged abdominal or pelvic lymph nodes. Reproductive: Status post hysterectomy. No significant adnexal masses. Small cysts on each ovary. Other: 3 cm periumbilical hernia containing only fat, unchanged. No ascites. No free air. Musculoskeletal: No acute abnormality. Left total hip prosthesis. Lower lumbar fusion. IMPRESSION: 1. No acute abnormality of the abdomen or pelvis. 2. Tiny stone in the mid right kidney. Electronically Signed   By: Lorriane Shire M.D.   On: 10/31/2019 15:23   DG Chest Portable 1 View  Result Date: 10/31/2019 CLINICAL DATA:  Nausea, vomiting, and abdominal cramping for the past 3 days. EXAM: PORTABLE CHEST 1 VIEW COMPARISON:  CT chest dated August 29, 2019. Chest x-ray dated April 26, 2019. FINDINGS: The heart size and mediastinal contours are within normal limits. Normal pulmonary vascularity. No focal consolidation, pleural effusion, or pneumothorax. Unchanged elevation of the  right hemidiaphragm. No acute osseous abnormality. IMPRESSION: No active disease. Electronically Signed   By: Titus Dubin M.D.   On: 10/31/2019 14:34   ECHOCARDIOGRAM COMPLETE  Result Date: 11/05/2019   ECHOCARDIOGRAM REPORT   Patient Name:   Amy Barnes Date of Exam: 11/05/2019 Medical Rec #:  [id number]     Height:       64.0 in Accession #:    [id number]    Weight:       358.5 lb Date of Birth:  May 01, 1951      BSA:          2.51 m Patient Age:    87 years      BP:           119/46 mmHg Patient Gender: F             HR:           69 bpm. Exam Location:  Inpatient Procedure: 2D Echo, Color Doppler, Cardiac Doppler and Intracardiac            Opacification Agent Indications:    Syncope 780.2 / R55  History:        Patient has no prior history of Echocardiogram examinations.                 CAD, Signs/Symptoms:Dyspnea; Risk Factors:Hypertension,                 Diabetes, Dyslipidemia, Former Smoker and Obesity. Thoracic                 aortic atherosclerosis.  Sonographer:    Vikki Ports Turrentine Referring Phys: [place]  Sonographer Comments: Image acquisition challenging due to patient body habitus. IMPRESSIONS  1. Left ventricular ejection fraction, by visual estimation, is 60 to 65%. The left ventricle has normal function. There is borderline left ventricular hypertrophy.  2. Definity contrast agent was given IV to delineate the left ventricular endocardial borders.  3. Elevated left ventricular end-diastolic pressure.  4. Left ventricular diastolic parameters are consistent with Grade I diastolic dysfunction (impaired relaxation).  5. The left ventricle has no regional wall motion abnormalities.  6. Global right ventricle has normal systolic function.The right ventricular size is mildly enlarged. No increase in right ventricular wall thickness.  7. Left atrial size was normal.  8. Right atrial size was normal.  9. Severe mitral annular calcification.  10. The mitral valve is degenerative. Trivial  mitral valve regurgitation. 11. The tricuspid valve is grossly normal. 12. The tricuspid valve is grossly normal. Tricuspid valve regurgitation is trivial. 13. The aortic valve is tricuspid. Aortic valve regurgitation is not visualized. No evidence of aortic valve sclerosis or stenosis. 14. The pulmonic valve was grossly normal. Pulmonic valve regurgitation is trivial. 15. The inferior vena cava is normal in size with greater than 50% respiratory variability, suggesting right atrial pressure of 3 mmHg. FINDINGS  Left Ventricle: Left ventricular ejection fraction, by visual estimation, is 60 to 65%. The left ventricle has normal function. Definity contrast agent was given IV to delineate the left ventricular endocardial borders. The left ventricle has no regional wall motion abnormalities. The left ventricular internal cavity size was the left ventricle is normal in size. There is borderline left ventricular hypertrophy. Left ventricular diastolic parameters are consistent with Grade I diastolic dysfunction (impaired relaxation). Elevated left ventricular end-diastolic pressure. Right Ventricle: The right ventricular size is mildly enlarged. No increase in right ventricular wall thickness. Global RV systolic function is has normal systolic function. Left Atrium: Left atrial size was normal in size. Right Atrium: Right atrial size was normal in size Pericardium: There is no evidence of pericardial effusion. Mitral Valve: The mitral valve is degenerative in appearance. Severe mitral annular calcification. Trivial mitral valve regurgitation. Tricuspid Valve: The tricuspid valve is grossly normal. Tricuspid valve regurgitation is trivial. Aortic Valve: The aortic valve is tricuspid. Aortic valve regurgitation is not visualized. The aortic valve is structurally normal, with no evidence of sclerosis or stenosis. Mild aortic valve annular calcification. Aortic valve mean gradient measures 8.0 mmHg. Aortic valve peak  gradient measures 15.7 mmHg. Aortic valve area, by VTI measures 2.24 cm. Pulmonic Valve: The pulmonic valve was grossly normal. Pulmonic valve regurgitation is trivial. Pulmonic regurgitation is trivial. Aorta: The aortic root is normal in size and structure. Venous: The inferior vena cava is normal in size with greater than 50% respiratory variability, suggesting right atrial pressure of 3 mmHg. IAS/Shunts: No atrial level shunt detected by color flow Doppler.  LEFT VENTRICLE PLAX 2D LVIDd:         5.50 cm  Diastology LVIDs:         3.40 cm  LV e' medial:   7.40 cm/s LV PW:         1.00 cm  LV E/e' medial: 19.1 LV IVS:        1.00 cm LVOT diam:     2.00 cm LV SV:         100 ml LV SV Index:   35.43 LVOT Area:     3.14 cm  RIGHT VENTRICLE RV S prime:     15.30 cm/s LEFT ATRIUM             Index       RIGHT ATRIUM           Index LA diam:        5.20 cm 2.07 cm/m  RA Area:     17.10 cm LA Vol (A2C):   66.6 ml 26.57 ml/m RA Volume:   43.90 ml  17.51 ml/m LA Vol (A4C):   82.1 ml 32.75 ml/m LA Biplane Vol: 75.3 ml 30.04 ml/m  AORTIC VALVE AV Area (Vmax):    2.62 cm AV Area (Vmean):   2.58 cm AV Area (VTI):     2.24 cm AV Vmax:           198.00  cm/s AV Vmean:          123.000 cm/s AV VTI:            0.349 m AV Peak Grad:      15.7 mmHg AV Mean Grad:      8.0 mmHg LVOT Vmax:         165.00 cm/s LVOT Vmean:        101.000 cm/s LVOT VTI:          0.249 m LVOT/AV VTI ratio: 0.71  AORTA Ao Root diam: 2.90 cm MITRAL VALVE MV Area (PHT): 2.74 cm              SHUNTS MV PHT:        80.33 msec            Systemic VTI:  0.25 m MV Decel Time: 277 msec              Systemic Diam: 2.00 cm MV E velocity: 141.00 cm/s 103 cm/s MV A velocity: 134.00 cm/s 70.3 cm/s MV E/A ratio:  1.05        1.5  Kate Sable MD Electronically signed by Kate Sable MD Signature Date/Time: 11/05/2019/12:41:25 PM    Final     Microbiology: Recent Results (from the past 240 hour(s))  Urine Culture     Status: Abnormal   Collection  Time: 10/30/19  7:21 PM   Specimen: Urine, Random  Result Value Ref Range Status   Specimen Description   Final    URINE, RANDOM Performed at [facility], [address] [place], [place] [address]    Special Requests   Final    NONE Performed at [facility], [address] [place], [place] [address]    Culture (A)  Final    >=100,000 COLONIES/mL KLEBSIELLA OXYTOCA Confirmed Extended Spectrum Beta-Lactamase Producer (ESBL).  In bloodstream infections from ESBL organisms, carbapenems are preferred over piperacillin/tazobactam. They are shown to have a lower risk of mortality.    Report Status 11/01/2019 FINAL  Final   Organism ID, Bacteria KLEBSIELLA OXYTOCA (A)  Final      Susceptibility   Klebsiella oxytoca - MIC*    AMPICILLIN >=32 RESISTANT Resistant     CEFAZOLIN >=64 RESISTANT Resistant     CEFTRIAXONE 32 RESISTANT Resistant     CIPROFLOXACIN <=0.25 SENSITIVE Sensitive     GENTAMICIN <=1 SENSITIVE Sensitive     IMIPENEM <=0.25 SENSITIVE Sensitive     NITROFURANTOIN 32 SENSITIVE Sensitive     TRIMETH/SULFA <=20 SENSITIVE Sensitive     AMPICILLIN/SULBACTAM >=32 RESISTANT Resistant     PIP/TAZO >=128 RESISTANT Resistant     * >=100,000 COLONIES/mL KLEBSIELLA OXYTOCA  SARS Coronavirus 2 Ag (30 min TAT) - Nasal Swab (BD Veritor Kit)     Status: None   Collection Time: 10/31/19  2:00 PM   Specimen: Nasal Swab (BD Veritor Kit)  Result Value Ref Range Status   SARS Coronavirus 2 Ag NEGATIVE NEGATIVE Final    Comment: (NOTE) SARS-CoV-2 antigen NOT DETECTED.  Negative results are presumptive.  Negative results do not preclude SARS-CoV-2 infection and should not be used as the sole basis for treatment or other patient management decisions, including infection  control decisions, particularly in the presence of clinical signs and  symptoms consistent with COVID-19, or in those who have been in contact with the virus.  Negative results must be combined  with clinical observations, patient history, and epidemiological information. The expected result is Negative. Fact  Sheet for Patients: PodPark.tn Fact Sheet for Healthcare Providers: GiftContent.is This test is not yet approved or cleared by the [place] FDA and  has been authorized for detection and/or diagnosis of SARS-CoV-2 by FDA under an Emergency Use Authorization (EUA).  This EUA will remain in effect (meaning this test can be used) for the duration of  the COVID-19 de claration under Section 564(b)(1) of the Act, 21 U.S.C. section 360bbb-3(b)(1), unless the authorization is terminated or revoked sooner. Performed at [facility], [address] [place], [place] [address]   Respiratory Panel by RT PCR (Flu A&B, Covid) - Nasopharyngeal Swab     Status: None   Collection Time: 11/04/19  6:29 PM   Specimen: Nasopharyngeal Swab  Result Value Ref Range Status   SARS Coronavirus 2 by RT PCR NEGATIVE NEGATIVE Final    Comment: (NOTE) SARS-CoV-2 target nucleic acids are NOT DETECTED. The SARS-CoV-2 RNA is generally detectable in upper respiratoy specimens during the acute phase of infection. The lowest concentration of SARS-CoV-2 viral copies this assay can detect is 131 copies/mL. A negative result does not preclude SARS-Cov-2 infection and should not be used as the sole basis for treatment or other patient management decisions. A negative result may occur with  improper specimen collection/handling, submission of specimen other than nasopharyngeal swab, presence of viral mutation(s) within the areas targeted by this assay, and inadequate number of viral copies (<131 copies/mL). A negative result must be combined with clinical observations, patient history, and epidemiological information. The expected result is Negative. Fact Sheet for Patients:  PinkCheek.be Fact Sheet for  Healthcare Providers:  GravelBags.it This test is not yet ap proved or cleared by the [place] FDA and  has been authorized for detection and/or diagnosis of SARS-CoV-2 by FDA under an Emergency Use Authorization (EUA). This EUA will remain  in effect (meaning this test can be used) for the duration of the COVID-19 declaration under Section 564(b)(1) of the Act, 21 U.S.C. section 360bbb-3(b)(1), unless the authorization is terminated or revoked sooner.    Influenza A by PCR NEGATIVE NEGATIVE Final   Influenza B by PCR NEGATIVE NEGATIVE Final    Comment: (NOTE) The Xpert Xpress SARS-CoV-2/FLU/RSV assay is intended as an aid in  the diagnosis of influenza from Nasopharyngeal swab specimens and  should not be used as a sole basis for treatment. Nasal washings and  aspirates are unacceptable for Xpert Xpress SARS-CoV-2/FLU/RSV  testing. Fact Sheet for Patients: PinkCheek.be Fact Sheet for Healthcare Providers: GravelBags.it This test is not yet approved or cleared by the [place] FDA and  has been authorized for detection and/or diagnosis of SARS-CoV-2 by  FDA under an Emergency Use Authorization (EUA). This EUA will remain  in effect (meaning this test can be used) for the duration of the  Covid-19 declaration under Section 564(b)(1) of the Act, 21  U.S.C. section 360bbb-3(b)(1), unless the authorization is  terminated or revoked. Performed at [facility], [place] [address], [place], [place] [address]   Urine culture     Status: Abnormal   Collection Time: 11/05/19 12:04 AM   Specimen: Urine, Random  Result Value Ref Range Status   Specimen Description   Final    URINE, RANDOM Performed at [facility] [address] [place], [place] [address]    Special Requests   Final    NONE Performed at [facility], [place] [address]  [place], [place] [address]    Culture  MULTIPLE SPECIES PRESENT, SUGGEST RECOLLECTION (A)  Final   Report Status 11/06/2019 FINAL  Final     Labs: Basic Metabolic Panel: Recent Labs  Lab 10/30/19 1641 10/31/19 1353 11/03/19 1301 11/04/19 1535 11/05/19 0510  NA 128* 123* 126* 130* 126*  K 3.9 3.6 3.5 3.6 3.3*  CL 90* 89* 90* 91* 93*  CO2 _0 GLUCOSE 201* 211* 181* 165* 145*  BUN _1 34* 39*  CREATININE 0.55 0.64 0.94 2.29* 2.26*  CALCIUM 9.6 9.6 9.2 9.2 8.4*  MG  --   --   --  2.1  --    Liver Function Tests: Recent Labs  Lab 10/30/19 1641 10/31/19 1353 11/04/19 1535 11/05/19 0510  AST 38 34 24 22  ALT 48* 43 34 30  ALKPHOS 99 90 86 76  BILITOT 0.8 0.7 0.9 1.0  PROT 8.0 7.8 7.3 6.2*  ALBUMIN 4.6 4.4 4.4 3.6   Recent Labs  Lab 10/30/19 1641 10/31/19 1353 11/04/19 1535  LIPASE 23 24 71*   No results for input(s): AMMONIA in the last 168 hours. CBC: Recent Labs  Lab 10/30/19 1641 10/31/19 1353 11/04/19 1535 11/05/19 0510  WBC 7.8 8.7 11.0* 8.7  NEUTROABS  --  5.7 7.6  --   HGB 14.2 14.2 13.7 13.1  HCT 42.8 42.4 40.9 40.0  MCV 83.9 83.6 84.9 86.8  PLT 299 279 255 205   Cardiac Enzymes: No results for input(s): CKTOTAL, CKMB, CKMBINDEX, TROPONINI in the last 168 hours. BNP: BNP (last 3 results) No results for input(s): BNP in the last 8760 hours.  ProBNP (last 3 results) No results for input(s): PROBNP in the last 8760 hours.  CBG: Recent Labs  Lab 11/05/19 0502 11/05/19 0807  GLUCAP 136* 136*       Signed:  Cristal Deer, MD [facility] 11/06/2019, 1:14 PM

## 2019-11-06 NOTE — Plan of Care (Signed)
Discharge instructions reviewed with daughter and patient, questions answered, verbalized understanding.  Patient transported to main entrance via wheelchair to be taken home by daughter.

## 2019-11-07 ENCOUNTER — Encounter: Payer: Self-pay | Admitting: Family Medicine

## 2019-11-07 ENCOUNTER — Telehealth: Payer: Self-pay

## 2019-11-07 MED ORDER — BUDESONIDE-FORMOTEROL FUMARATE 160-4.5 MCG/ACT IN AERO: 2.0000 | INHALATION_SPRAY | Freq: Two times a day (BID) | RESPIRATORY_TRACT | 3 refills | Status: DC

## 2019-11-07 NOTE — Telephone Encounter (Signed)
Transition Care Management Follow-up Telephone Call  Date of discharge and from where: 11/06/2019, [facility]  How have you been since you were released from the hospital? Patient states she is feeling much better since being home in her own bed.   Any questions or concerns? No   Items Reviewed:  Did the pt receive and understand the discharge instructions provided? Yes   Medications obtained and verified? Yes   Any new allergies since your discharge? No   Dietary orders reviewed? Yes  Do you have support at home? Yes   Functional Questionnaire: (I = Independent and D = Dependent) ADLs: I  Bathing/Dressing- I  Meal Prep- I  Eating- I  Maintaining continence- I  Transferring/Ambulation- I  Managing Meds- I  Follow up appointments reviewed:   PCP Hospital f/u appt confirmed? Yes  Scheduled to see Dr. Sharen Hones on 11/16/2019 @ 11:30 am.  Specialist Hospital f/u appt confirmed? N/A   Are transportation arrangements needed? No   If their condition worsens, is the pt aware to call PCP or go to the Emergency Dept.? Yes  Was the patient provided with contact information for the PCP's office or ED? Yes  Was to pt encouraged to call back with questions or concerns? Yes

## 2019-11-07 NOTE — Telephone Encounter (Signed)
Please see the following and advise:  Dr. Drue Second, Can you send the Rx for Doxycyline to [facility] [address] [place] [address] [phone]. Most of the prescriptions are significantly cheaper at [facility] so we are trying to switch everything over to there. Is Thursday's appointment going to be a virtual appointment or an in person appointment?  Mom had a reaction to the Detrol that the Urologist gave her and started throwing up which turned into almost a week of throwing up and not eating, severe dehydration, a UTI, & 2 nights in the hospital this weekend.  I went ahead and had the doctor's run what look like your normal blood work so that you would have all of it in her record in case we did the appointment virtually.  The doctor gave her some extra antibiotics in the hospital but said to go back to the Doxy twice a day at home to knock out the rest of the UTI.  Please call if you have any questions or just let me know here how we will see you this week. Rocky Crafts [phone]

## 2019-11-08 ENCOUNTER — Inpatient Hospital Stay (HOSPITAL_COMMUNITY)
Admission: EM | Admit: 2019-11-08 | Discharge: 2019-11-14 | DRG: 286 | Disposition: A | Discharge status: Home / Self Care | Payer: Medicare Other | Attending: Family Medicine | Admitting: Family Medicine

## 2019-11-08 ENCOUNTER — Encounter (HOSPITAL_COMMUNITY): Payer: Self-pay | Admitting: Emergency Medicine

## 2019-11-08 ENCOUNTER — Other Ambulatory Visit: Payer: Self-pay

## 2019-11-08 ENCOUNTER — Emergency Department (HOSPITAL_COMMUNITY): Payer: Medicare Other

## 2019-11-08 ENCOUNTER — Telehealth: Payer: Self-pay | Admitting: *Deleted

## 2019-11-08 DIAGNOSIS — Z96643 Presence of artificial hip joint, bilateral: Secondary | ICD-10-CM | POA: Diagnosis present

## 2019-11-08 DIAGNOSIS — J439 Emphysema, unspecified: Secondary | ICD-10-CM | POA: Diagnosis present

## 2019-11-08 DIAGNOSIS — N200 Calculus of kidney: Secondary | ICD-10-CM | POA: Diagnosis not present

## 2019-11-08 DIAGNOSIS — R1032 Left lower quadrant pain: Secondary | ICD-10-CM | POA: Diagnosis present

## 2019-11-08 DIAGNOSIS — R52 Pain, unspecified: Secondary | ICD-10-CM | POA: Diagnosis not present

## 2019-11-08 DIAGNOSIS — I5021 Acute systolic (congestive) heart failure: Secondary | ICD-10-CM | POA: Diagnosis present

## 2019-11-08 DIAGNOSIS — R1084 Generalized abdominal pain: Secondary | ICD-10-CM

## 2019-11-08 DIAGNOSIS — R778 Other specified abnormalities of plasma proteins: Secondary | ICD-10-CM

## 2019-11-08 DIAGNOSIS — Z79890 Hormone replacement therapy (postmenopausal): Secondary | ICD-10-CM

## 2019-11-08 DIAGNOSIS — E039 Hypothyroidism, unspecified: Secondary | ICD-10-CM | POA: Diagnosis present

## 2019-11-08 DIAGNOSIS — F1010 Alcohol abuse, uncomplicated: Secondary | ICD-10-CM | POA: Diagnosis present

## 2019-11-08 DIAGNOSIS — Z981 Arthrodesis status: Secondary | ICD-10-CM

## 2019-11-08 DIAGNOSIS — G4733 Obstructive sleep apnea (adult) (pediatric): Secondary | ICD-10-CM | POA: Diagnosis present

## 2019-11-08 DIAGNOSIS — I110 Hypertensive heart disease with heart failure: Principal | ICD-10-CM | POA: Diagnosis present

## 2019-11-08 DIAGNOSIS — R9431 Abnormal electrocardiogram [ECG] [EKG]: Secondary | ICD-10-CM | POA: Diagnosis present

## 2019-11-08 DIAGNOSIS — Z7982 Long term (current) use of aspirin: Secondary | ICD-10-CM

## 2019-11-08 DIAGNOSIS — Z20822 Contact with and (suspected) exposure to covid-19: Secondary | ICD-10-CM | POA: Diagnosis present

## 2019-11-08 DIAGNOSIS — E1143 Type 2 diabetes mellitus with diabetic autonomic (poly)neuropathy: Secondary | ICD-10-CM | POA: Diagnosis present

## 2019-11-08 DIAGNOSIS — I959 Hypotension, unspecified: Secondary | ICD-10-CM | POA: Diagnosis present

## 2019-11-08 DIAGNOSIS — I214 Non-ST elevation (NSTEMI) myocardial infarction: Secondary | ICD-10-CM

## 2019-11-08 DIAGNOSIS — M25552 Pain in left hip: Secondary | ICD-10-CM | POA: Diagnosis present

## 2019-11-08 DIAGNOSIS — K429 Umbilical hernia without obstruction or gangrene: Secondary | ICD-10-CM | POA: Diagnosis present

## 2019-11-08 DIAGNOSIS — R112 Nausea with vomiting, unspecified: Secondary | ICD-10-CM | POA: Diagnosis not present

## 2019-11-08 DIAGNOSIS — E1165 Type 2 diabetes mellitus with hyperglycemia: Secondary | ICD-10-CM | POA: Diagnosis present

## 2019-11-08 DIAGNOSIS — K3184 Gastroparesis: Secondary | ICD-10-CM | POA: Diagnosis present

## 2019-11-08 DIAGNOSIS — Z8614 Personal history of Methicillin resistant Staphylococcus aureus infection: Secondary | ICD-10-CM

## 2019-11-08 DIAGNOSIS — Z6841 Body Mass Index (BMI) 40.0 and over, adult: Secondary | ICD-10-CM

## 2019-11-08 DIAGNOSIS — R110 Nausea: Secondary | ICD-10-CM | POA: Diagnosis not present

## 2019-11-08 DIAGNOSIS — R0602 Shortness of breath: Secondary | ICD-10-CM | POA: Diagnosis not present

## 2019-11-08 DIAGNOSIS — K5900 Constipation, unspecified: Secondary | ICD-10-CM | POA: Diagnosis present

## 2019-11-08 DIAGNOSIS — Z833 Family history of diabetes mellitus: Secondary | ICD-10-CM

## 2019-11-08 DIAGNOSIS — J9601 Acute respiratory failure with hypoxia: Secondary | ICD-10-CM | POA: Diagnosis present

## 2019-11-08 DIAGNOSIS — I447 Left bundle-branch block, unspecified: Secondary | ICD-10-CM | POA: Diagnosis present

## 2019-11-08 DIAGNOSIS — Z87891 Personal history of nicotine dependence: Secondary | ICD-10-CM

## 2019-11-08 DIAGNOSIS — J45909 Unspecified asthma, uncomplicated: Secondary | ICD-10-CM | POA: Diagnosis present

## 2019-11-08 DIAGNOSIS — Z8042 Family history of malignant neoplasm of prostate: Secondary | ICD-10-CM

## 2019-11-08 DIAGNOSIS — R109 Unspecified abdominal pain: Secondary | ICD-10-CM

## 2019-11-08 DIAGNOSIS — F419 Anxiety disorder, unspecified: Secondary | ICD-10-CM | POA: Diagnosis present

## 2019-11-08 DIAGNOSIS — R1110 Vomiting, unspecified: Secondary | ICD-10-CM

## 2019-11-08 DIAGNOSIS — Z8249 Family history of ischemic heart disease and other diseases of the circulatory system: Secondary | ICD-10-CM

## 2019-11-08 DIAGNOSIS — F319 Bipolar disorder, unspecified: Secondary | ICD-10-CM | POA: Diagnosis present

## 2019-11-08 DIAGNOSIS — Z79899 Other long term (current) drug therapy: Secondary | ICD-10-CM

## 2019-11-08 DIAGNOSIS — E785 Hyperlipidemia, unspecified: Secondary | ICD-10-CM | POA: Diagnosis present

## 2019-11-08 DIAGNOSIS — I10 Essential (primary) hypertension: Secondary | ICD-10-CM | POA: Diagnosis not present

## 2019-11-08 DIAGNOSIS — I248 Other forms of acute ischemic heart disease: Secondary | ICD-10-CM | POA: Diagnosis present

## 2019-11-08 DIAGNOSIS — J810 Acute pulmonary edema: Secondary | ICD-10-CM

## 2019-11-08 LAB — COMPREHENSIVE METABOLIC PANEL
ALT: 32 U/L (ref 0–44)
AST: 28 U/L (ref 15–41)
Albumin: 4.2 g/dL (ref 3.5–5.0)
Alkaline Phosphatase: 83 U/L (ref 38–126)
Anion gap: 12 (ref 5–15)
BUN: 11 mg/dL (ref 8–23)
CO2: 26 mmol/L (ref 22–32)
Calcium: 9 mg/dL (ref 8.9–10.3)
Chloride: 100 mmol/L (ref 98–111)
Creatinine, Ser: 0.56 mg/dL (ref 0.44–1.00)
GFR calc Af Amer: >60 mL/min (ref >60)
GFR calc non Af Amer: >60 mL/min (ref >60)
Glucose, Bld: 217 mg/dL — ABNORMAL HIGH (ref 70–99)
Potassium: 4.2 mmol/L (ref 3.5–5.1)
Sodium: 138 mmol/L (ref 135–145)
Total Bilirubin: 0.6 mg/dL (ref 0.3–1.2)
Total Protein: 7.4 g/dL (ref 6.5–8.1)

## 2019-11-08 LAB — CBC WITH DIFFERENTIAL/PLATELET
Abs Immature Granulocytes: 0.04 10*3/uL (ref 0.00–0.07)
Basophils Absolute: 0 10*3/uL (ref 0.0–0.1)
Basophils Relative: 0 %
Eosinophils Absolute: 0 10*3/uL (ref 0.0–0.5)
Eosinophils Relative: 0 %
HCT: 43 % (ref 36.0–46.0)
Hemoglobin: 14.1 g/dL (ref 12.0–15.0)
Immature Granulocytes: 1 %
Lymphocytes Relative: 11 %
Lymphs Abs: 0.9 10*3/uL (ref 0.7–4.0)
MCH: 28.4 pg (ref 26.0–34.0)
MCHC: 32.8 g/dL (ref 30.0–36.0)
MCV: 86.5 fL (ref 80.0–100.0)
Monocytes Absolute: 0.2 10*3/uL (ref 0.1–1.0)
Monocytes Relative: 3 %
Neutro Abs: 7.3 10*3/uL (ref 1.7–7.7)
Neutrophils Relative %: 85 %
Platelets: 288 10*3/uL (ref 150–400)
RBC: 4.97 MIL/uL (ref 3.87–5.11)
RDW: 16 % — ABNORMAL HIGH (ref 11.5–15.5)
WBC: 8.5 10*3/uL (ref 4.0–10.5)
nRBC: 0 % (ref 0.0–0.2)

## 2019-11-08 LAB — LACTIC ACID, PLASMA
Lactic Acid, Venous: 1.9 mmol/L (ref 0.5–1.9)
Lactic Acid, Venous: 1.7 mmol/L (ref 0.5–1.9)

## 2019-11-08 LAB — D-DIMER, QUANTITATIVE: D-Dimer, Quant: 2.09 ug/mL-FEU — ABNORMAL HIGH (ref 0.00–0.50)

## 2019-11-08 LAB — TROPONIN I (HIGH SENSITIVITY)
Troponin I (High Sensitivity): 106 ng/L (ref <18)
Troponin I (High Sensitivity): 193 ng/L (ref <18)

## 2019-11-08 LAB — LIPASE, BLOOD: Lipase: 22 U/L (ref 11–51)

## 2019-11-08 MED ORDER — ALBUTEROL SULFATE HFA 108 (90 BASE) MCG/ACT IN AERS
2.0000 | INHALATION_SPRAY | Freq: Once | RESPIRATORY_TRACT | Status: AC
  Administered 2019-11-08: 2 via RESPIRATORY_TRACT
  Filled 2019-11-08: qty 6.7

## 2019-11-08 MED ORDER — MORPHINE SULFATE (PF) 4 MG/ML IV SOLN
4.0000 mg | Freq: Once | INTRAVENOUS | Status: AC
  Administered 2019-11-08: 4 mg via INTRAVENOUS
  Filled 2019-11-08: qty 1

## 2019-11-08 MED ORDER — FAMOTIDINE IN NACL 20-0.9 MG/50ML-% IV SOLN
20.0000 mg | Freq: Once | INTRAVENOUS | Status: AC
  Administered 2019-11-08: 20 mg via INTRAVENOUS
  Filled 2019-11-08: qty 50

## 2019-11-08 MED ORDER — SODIUM CHLORIDE 0.9 % IV BOLUS
1000.0000 mL | Freq: Once | INTRAVENOUS | Status: AC
  Administered 2019-11-08: 1000 mL via INTRAVENOUS

## 2019-11-08 MED ORDER — PROMETHAZINE HCL 25 MG RE SUPP: 25.0000 mg | Freq: Four times a day (QID) | RECTAL | 0 refills | Status: DC | PRN

## 2019-11-08 MED ORDER — ONDANSETRON HCL 4 MG/2ML IJ SOLN
4.0000 mg | Freq: Once | INTRAMUSCULAR | Status: AC
  Administered 2019-11-08: 4 mg via INTRAVENOUS
  Filled 2019-11-08: qty 2

## 2019-11-08 NOTE — Telephone Encounter (Signed)
Noted. Will await ER eval. Phenergan supp sent in.

## 2019-11-08 NOTE — ED Notes (Addendum)
Patients SPO2 dropped to 88% RA. Patient was placed on 4L of oxygen and her SPO2 is currently 98%.

## 2019-11-08 NOTE — Telephone Encounter (Signed)
Patient's daughter called stating that her mom was in the hospital until Sunday. [name] stated that her mom was dehydrated. [name] stated that her mom started back throwing up about 2 1/2 hours ago.[name] stated that she has tried the dissolvable Zofran and threw up shortly after taking it. [name] stated that her mom is having abdominal cramping and may be constipated because she can't remember when she had her last bowel movement. [name] stated that her mom's blood pressure medication was stopped because her blood pressure was low while in the hospital. [name] stated that her mom's blood pressure around 3:00 was 178/100. [name] stated that they have tried miralax, but her mom threw that up also.  After speaking to Dr. Sharen Hones, called [name] back and advised her that he can send her suppositories in to help with the nausea and may help with the constipation also. [name] stated that her mom's abdominal pain has gotten worse and they will probably call EMS out in the next 30 minutes. [name] did request that Dr. Sharen Hones go ahead and send the suppositories to the pharmacy in case they don't call EMS and if they do, she will have for the future. Pharmacy [facility]

## 2019-11-08 NOTE — ED Triage Notes (Signed)
Patient presents with nausea, vomiting and abdominal pain for 2 weeks without improvement. She has presented to the ED here and in [place] for this problem and usually get fluids.   EMS vitals: 180 CBG 179/98 BP 98 HR 100% O2 sat on 2L on nasal cannula

## 2019-11-08 NOTE — ED Provider Notes (Signed)
Patient received in sign out from K. Ala Dach, [place]. Patient presents today for evaluation of abdominal pain, nausea, and vomiting. Patient awaiting completion of diagnostic testing.  Patient discussed with Dr. Manus Gunning.  After returning from CT, patient reported chest pain and increasing shortness of breath. No ischemic changes on ECG. Initial troponin of 106, increased to 193. D-dimer of 2.09.  Patient given 324 asa. CTA of chest ordered. Patient will need admission.  Patient signed out to A. Harris, PA-C awaiting completion of additional diagnostics.  Results for orders placed or performed during the hospital encounter of 11/08/19  Comprehensive metabolic panel  Result Value Ref Range   Sodium 138 135 - 145 mmol/L   Potassium 4.2 3.5 - 5.1 mmol/L   Chloride 100 98 - 111 mmol/L   CO2 26 22 - 32 mmol/L   Glucose, Bld 217 (H) 70 - 99 mg/dL   BUN 11 8 - 23 mg/dL   Creatinine, Ser 4.08 0.44 - 1.00 mg/dL   Calcium 9.0 8.9 - 14.4 mg/dL   Total Protein 7.4 6.5 - 8.1 g/dL   Albumin 4.2 3.5 - 5.0 g/dL   AST 28 15 - 41 U/L   ALT 32 0 - 44 U/L   Alkaline Phosphatase 83 38 - 126 U/L   Total Bilirubin 0.6 0.3 - 1.2 mg/dL   GFR calc non Af Amer >60 >60 mL/min   GFR calc Af Amer >60 >60 mL/min   Anion gap 12 5 - 15  CBC with Differential  Result Value Ref Range   WBC 8.5 4.0 - 10.5 K/uL   RBC 4.97 3.87 - 5.11 MIL/uL   Hemoglobin 14.1 12.0 - 15.0 g/dL   HCT 81.8 56.3 - 14.9 %   MCV 86.5 80.0 - 100.0 fL   MCH 28.4 26.0 - 34.0 pg   MCHC 32.8 30.0 - 36.0 g/dL   RDW 70.2 (H) 63.7 - 85.8 %   Platelets 288 150 - 400 K/uL   nRBC 0.0 0.0 - 0.2 %   Neutrophils Relative % 85 %   Neutro Abs 7.3 1.7 - 7.7 K/uL   Lymphocytes Relative 11 %   Lymphs Abs 0.9 0.7 - 4.0 K/uL   Monocytes Relative 3 %   Monocytes Absolute 0.2 0.1 - 1.0 K/uL   Eosinophils Relative 0 %   Eosinophils Absolute 0.0 0.0 - 0.5 K/uL   Basophils Relative 0 %   Basophils Absolute 0.0 0.0 - 0.1 K/uL   Immature Granulocytes 1 %   Abs  Immature Granulocytes 0.04 0.00 - 0.07 K/uL  Lipase, blood  Result Value Ref Range   Lipase 22 11 - 51 U/L  Lactic acid, plasma  Result Value Ref Range   Lactic Acid, Venous 1.9 0.5 - 1.9 mmol/L  Lactic acid, plasma  Result Value Ref Range   Lactic Acid, Venous 1.7 0.5 - 1.9 mmol/L  D-dimer, quantitative (not at [facility])  Result Value Ref Range   D-Dimer, Quant 2.09 (H) 0.00 - 0.50 ug/mL-FEU  Troponin I (High Sensitivity)  Result Value Ref Range   Troponin I (High Sensitivity) 106 (HH) <18 ng/L  Troponin I (High Sensitivity)  Result Value Ref Range   Troponin I (High Sensitivity) 193 (HH) <18 ng/L   CT ABDOMEN PELVIS WO CONTRAST  Result Date: 11/08/2019 CLINICAL DATA:  Nausea, vomiting, abdominal pain for 2 weeks EXAM: CT ABDOMEN AND PELVIS WITHOUT CONTRAST TECHNIQUE: Multidetector CT imaging of the abdomen and pelvis was performed following the standard protocol without IV contrast. COMPARISON:  10/31/2019 FINDINGS: Lower chest: Left ventricular calcifications are unchanged. No pericardial effusion. Bibasilar interlobular septal thickening and vascular prominence consistent with interstitial edema. Hepatobiliary: No focal liver abnormality is seen. No gallstones, gallbladder wall thickening, or biliary dilatation. Pancreas: Unremarkable. No pancreatic ductal dilatation or surrounding inflammatory changes. Spleen: Normal in size without focal abnormality. Adrenals/Urinary Tract: There is a punctate less than 2 mm nonobstructing calculus right kidney, reference image 44 series 2. No obstructive uropathy. The left kidney is unremarkable. Bladder is grossly normal. The adrenals are unremarkable. Stomach/Bowel: No bowel obstruction or ileus. Normal appendix right lower quadrant. Vascular/Lymphatic: Mild calcified plaque within the distal aorta at the bifurcation. No pathologic adenopathy within the abdomen or pelvis. Reproductive: Status post hysterectomy. No adnexal masses. Other: Small fat containing  umbilical hernia unchanged. No free fluid or free gas within the abdomen or pelvis. Musculoskeletal: Left hip arthroplasty is identified. Postsurgical changes lower lumbar spine. No acute or destructive bony lesions. IMPRESSION: 1. Punctate 2 mm nonobstructing right renal calculus. 2. Bibasilar interstitial edema. 3. Stable fat containing umbilical hernia. Electronically Signed   By: Sharlet Salina M.D.   On: 11/08/2019 21:22   MR BRAIN WO CONTRAST  Result Date: 11/04/2019 CLINICAL DATA:  TIA.  Slurred speech and blurry vision. EXAM: MRI HEAD WITHOUT CONTRAST TECHNIQUE: Multiplanar, multiecho pulse sequences of the brain and surrounding structures were obtained without intravenous contrast. COMPARISON:  Head CT 05/26/2019 FINDINGS: Brain: No acute infarction, hemorrhage, hydrocephalus, extra-axial collection or mass lesion. 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 Wedge following the left basal ganglia is difficult to distinguish between a normal vessel and remote perforator infarct. Brain volume is normal. No acute infarct, hydrocephalus, collection, or masslike finding. Vascular: Normal flow voids Skull and upper cervical spine: Metal artifact near the left zygomatic arch. No evidence of marrow lesion. Sinuses/Orbits: Negative IMPRESSION: 1. Motion degraded brain MRI without acute finding. 2. 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 Electronically Signed   By: Marnee Spring M.D.   On: 11/04/2019 17:00   CT ABDOMEN PELVIS W CONTRAST  Result Date: 10/31/2019 CLINICAL DATA:  Nausea and vomiting and abdominal cramping for 3 days. EXAM: CT ABDOMEN AND PELVIS WITH CONTRAST TECHNIQUE: Multidetector CT imaging of the abdomen and pelvis was performed using the standard protocol following bolus administration of intravenous contrast. CONTRAST:  OMNIPAQUE IOHEXOL 300 MG/ML  SOLN COMPARISON:  CT scan dated 11/27/2015 FINDINGS: Lower chest:  Extensive chronic stable calcification in the heart. Hepatobiliary: No focal liver abnormality is seen. No gallstones, gallbladder wall thickening, or biliary dilatation. Pancreas: Unremarkable. No pancreatic ductal dilatation or surrounding inflammatory changes. Spleen: Normal in size without focal abnormality. Adrenals/Urinary Tract: Tiny stone in the mid right kidney. The kidneys are otherwise normal. No hydronephrosis. The visualized portion of the bladder is normal. Left hip prosthesis obscures some detail of the bladder. Stomach/Bowel: Stomach is within normal limits. Appendix appears normal. No evidence of bowel wall thickening, distention, or inflammatory changes. Vascular/Lymphatic: Aortic atherosclerosis. No enlarged abdominal or pelvic lymph nodes. Reproductive: Status post hysterectomy. No significant adnexal masses. Small cysts on each ovary. Other: 3 cm periumbilical hernia containing only fat, unchanged. No ascites. No free air. Musculoskeletal: No acute abnormality. Left total hip prosthesis. Lower lumbar fusion. IMPRESSION: 1. No acute abnormality of the abdomen or pelvis. 2. Tiny stone in the mid right kidney. Electronically Signed   By: Francene Boyers M.D.   On: 10/31/2019 15:23   DG Chest Portable 1  View  Result Date: 10/31/2019 CLINICAL DATA:  Nausea, vomiting, and abdominal cramping for the past 3 days. EXAM: PORTABLE CHEST 1 VIEW COMPARISON:  CT chest dated August 29, 2019. Chest x-ray dated April 26, 2019. FINDINGS: The heart size and mediastinal contours are within normal limits. Normal pulmonary vascularity. No focal consolidation, pleural effusion, or pneumothorax. Unchanged elevation of the right hemidiaphragm. No acute osseous abnormality. IMPRESSION: No active disease. Electronically Signed   By: Obie Dredge M.D.   On: 10/31/2019 14:34   ECHOCARDIOGRAM COMPLETE  Result Date: 11/05/2019   ECHOCARDIOGRAM REPORT   Patient Name:   Amy Barnes Date of Exam: 11/05/2019 Medical Rec  #:  [id number]     Height:       64.0 in Accession #:    [id number]    Weight:       358.5 lb Date of Birth:  05-Jan-1951      BSA:          2.51 m Patient Age:    68 years      BP:           119/46 mmHg Patient Gender: F             HR:           69 bpm. Exam Location:  Inpatient Procedure: 2D Echo, Color Doppler, Cardiac Doppler and Intracardiac            Opacification Agent Indications:    Syncope 780.2 / R55  History:        Patient has no prior history of Echocardiogram examinations.                 CAD, Signs/Symptoms:Dyspnea; Risk Factors:Hypertension,                 Diabetes, Dyslipidemia, Former Smoker and Obesity. Thoracic                 aortic atherosclerosis.  Sonographer:    Leeroy Bock Turrentine Referring Phys: [id number] PARDEEP [id number]  Sonographer Comments: Image acquisition challenging due to patient body habitus. IMPRESSIONS  1. Left ventricular ejection fraction, by visual estimation, is 60 to 65%. The left ventricle has normal function. There is borderline left ventricular hypertrophy.  2. Definity contrast agent was given IV to delineate the left ventricular endocardial borders.  3. Elevated left ventricular end-diastolic pressure.  4. Left ventricular diastolic parameters are consistent with Grade I diastolic dysfunction (impaired relaxation).  5. The left ventricle has no regional wall motion abnormalities.  6. Global right ventricle has normal systolic function.The right ventricular size is mildly enlarged. No increase in right ventricular wall thickness.  7. Left atrial size was normal.  8. Right atrial size was normal.  9. Severe mitral annular calcification. 10. The mitral valve is degenerative. Trivial mitral valve regurgitation. 11. The tricuspid valve is grossly normal. 12. The tricuspid valve is grossly normal. Tricuspid valve regurgitation is trivial. 13. The aortic valve is tricuspid. Aortic valve regurgitation is not visualized. No evidence of aortic valve sclerosis or stenosis. 14. The  pulmonic valve was grossly normal. Pulmonic valve regurgitation is trivial. 15. The inferior vena cava is normal in size with greater than 50% respiratory variability, suggesting right atrial pressure of 3 mmHg. FINDINGS  Left Ventricle: Left ventricular ejection fraction, by visual estimation, is 60 to 65%. The left ventricle has normal function. Definity contrast agent was given IV to delineate the left ventricular endocardial borders. The left ventricle has no regional wall motion  abnormalities. The left ventricular internal cavity size was the left ventricle is normal in size. There is borderline left ventricular hypertrophy. Left ventricular diastolic parameters are consistent with Grade I diastolic dysfunction (impaired relaxation). Elevated left ventricular end-diastolic pressure. Right Ventricle: The right ventricular size is mildly enlarged. No increase in right ventricular wall thickness. Global RV systolic function is has normal systolic function. Left Atrium: Left atrial size was normal in size. Right Atrium: Right atrial size was normal in size Pericardium: There is no evidence of pericardial effusion. Mitral Valve: The mitral valve is degenerative in appearance. Severe mitral annular calcification. Trivial mitral valve regurgitation. Tricuspid Valve: The tricuspid valve is grossly normal. Tricuspid valve regurgitation is trivial. Aortic Valve: The aortic valve is tricuspid. Aortic valve regurgitation is not visualized. The aortic valve is structurally normal, with no evidence of sclerosis or stenosis. Mild aortic valve annular calcification. Aortic valve mean gradient measures 8.0 mmHg. Aortic valve peak gradient measures 15.7 mmHg. Aortic valve area, by VTI measures 2.24 cm. Pulmonic Valve: The pulmonic valve was grossly normal. Pulmonic valve regurgitation is trivial. Pulmonic regurgitation is trivial. Aorta: The aortic root is normal in size and structure. Venous: The inferior vena cava is normal  in size with greater than 50% respiratory variability, suggesting right atrial pressure of 3 mmHg. IAS/Shunts: No atrial level shunt detected by color flow Doppler.  LEFT VENTRICLE PLAX 2D LVIDd:         5.50 cm  Diastology LVIDs:         3.40 cm  LV e' medial:   7.40 cm/s LV PW:         1.00 cm  LV E/e' medial: 19.1 LV IVS:        1.00 cm LVOT diam:     2.00 cm LV SV:         100 ml LV SV Index:   35.43 LVOT Area:     3.14 cm  RIGHT VENTRICLE RV S prime:     15.30 cm/s LEFT ATRIUM             Index       RIGHT ATRIUM           Index LA diam:        5.20 cm 2.07 cm/m  RA Area:     17.10 cm LA Vol (A2C):   66.6 ml 26.57 ml/m RA Volume:   43.90 ml  17.51 ml/m LA Vol (A4C):   82.1 ml 32.75 ml/m LA Biplane Vol: 75.3 ml 30.04 ml/m  AORTIC VALVE AV Area (Vmax):    2.62 cm AV Area (Vmean):   2.58 cm AV Area (VTI):     2.24 cm AV Vmax:           198.00 cm/s AV Vmean:          123.000 cm/s AV VTI:            0.349 m AV Peak Grad:      15.7 mmHg AV Mean Grad:      8.0 mmHg LVOT Vmax:         165.00 cm/s LVOT Vmean:        101.000 cm/s LVOT VTI:          0.249 m LVOT/AV VTI ratio: 0.71  AORTA Ao Root diam: 2.90 cm MITRAL VALVE MV Area (PHT): 2.74 cm              SHUNTS MV PHT:  80.33 msec            Systemic VTI:  0.25 m MV Decel Time: 277 msec              Systemic Diam: 2.00 cm MV E velocity: 141.00 cm/s 103 cm/s MV A velocity: 134.00 cm/s 70.3 cm/s MV E/A ratio:  1.05        1.5  Prentice Docker MD Electronically signed by Prentice Docker MD Signature Date/Time: 11/05/2019/12:41:25 PM    Final      Felicie Morn, NP 11/09/19 4010    Glynn Octave, MD 11/09/19 2725

## 2019-11-08 NOTE — ED Notes (Signed)
Assisted pt to RR in wheelchair with RN [place]'s assistance.

## 2019-11-08 NOTE — ED Provider Notes (Signed)
[facility]-EMERGENCY DEPT Provider Note   CSN: [id number] Arrival date & time: 11/08/19  1745     History Chief Complaint  Patient presents with  . Abdominal Pain  . Nausea  . Emesis    Amy Barnes is a 69 y.o. female.  Amy Barnes is a 69 y.o. female with an extensive past medical history including hypertension, hyperlipidemia, diabetes, COPD, CAD, chronic pain, discitis, who presents to the emergency department for evaluation of abdominal pain, nausea and vomiting.  Patient has been dealing with the symptoms for almost 2 weeks at this point.  She has been seen in the ED multiple times and was admitted to the hospital for 2 days on 2/5, she is only been home from the hospital for about 2 days and was initially doing well able to eat and drink, but today around 1230 she started having multiple episodes of vomiting and severe generalized abdominal cramping.  She is not able to keep down any medications to help manage her symptoms at home, her family members tried giving her Bentyl and Zofran, they also tried giving her MiraLAX because they were not sure the last time she had a bowel movement.  She has not had any fevers or chills.  She denies chest pain or shortness of breath outside of her normal shortness of breath with COPD.  Denies fevers or chills.  She had a CT scan for evaluation of the symptoms on 2/1 that was unremarkable, she is also had Covid testing which has been negative.  Patient's daughter reports that they thought this may have been related to new medication that was started by her urologist, Detrol, but they discontinued the medication and her symptoms have continued.        Past Medical History:  Diagnosis Date  . Allergic rhinitis   . Ambulates with cane    straight  . Anemia   . Anxiety   . Asthma    seasonal  . Bipolar affective disorder (HCC)    takes Synthroid meds for Bipolar  . CAD (coronary artery disease) 07/2016   by CT scan  .  Carpal tunnel syndrome    had surgery but occasional still has some issues per patient  . Cataract   . Centrilobular emphysema (HCC) 07/2016   by CT scan - pt not aware of this  . Constipation due to pain medication   . Depression with anxiety   . Diabetes mellitus    type 2 - no meds diet controlled  . GERD (gastroesophageal reflux disease)   . History of blood transfusion   . 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 Hyperlipidemia   . Hypertension   . OSA (obstructive sleep apnea)    no longer using cpap, uses a bed that raises and lowers hob  . Osteoarthritis   . Osteoarthritis   . Osteopenia 08/11/2019   DEXA 07/2019 - T -1.1 R femur (osteopenia)  . Pneumonia   . Restless legs   . Septic arthritis (HCC) 10/11/2012  . Shingles 06/30/2016  . Shortness of breath    with exertion  . Status post revision of total hip replacement bilateral   prosthetic infection R 2013, L 2015  . SVD (spontaneous vaginal delivery)    x 3  . Thoracic aortic atherosclerosis (HCC) 11/207   by CT    Patient Active Problem List   Diagnosis Date Noted  . Acute kidney injury (  HCC) 11/04/2019  . Hyponatremia 11/03/2019  . Epigastric abdominal pain 11/03/2019  . Non-intractable vomiting 11/03/2019  . Diminished pulses in lower extremity 08/11/2019  . Osteopenia 08/11/2019  . Wound infection complicating hardware (HCC) 04/01/2019  . 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 Bilateral lower extremity edema 11/29/2018  . Neurogenic urinary incontinence 11/29/2018  . Urinary incontinence 11/29/2018  . Bladder spasms 11/29/2018  . Discogenic low back pain 11/29/2018  . Chronic sacroiliac joint pain (Bilateral) (R>L) 11/29/2018  . Spondylosis without myelopathy or radiculopathy, lumbar region 11/09/2018  . Chronic low back pain (Bilateral) (L>R) w/o sciatica 11/09/2018  . History of hip replacement (Left) 10/13/2018  . History of back  surgery 10/13/2018  . Vitamin D insufficiency 10/13/2018  . Osteoarthritis of facet joint of lumbar spine 10/13/2018  . Osteoarthritis involving multiple joints 10/13/2018  . Cervical radiculopathy (Left) 10/13/2018  . Lumbar facet arthropathy (Bilateral) 10/13/2018  . Lumbar facet syndrome (Bilateral) (L>R) 10/13/2018  . Abnormal MRI, lumbar spine (09/16/2018) 10/13/2018  . Lumbar nerve root compression (L4) (Left) 10/13/2018  . Lumbar foraminal stenosis 10/13/2018  . 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 Chronic low back pain (Primary Area of Pain) (Bilateral) (L>R) w/ sciatica (Left) 09/14/2018  . Chronic lower extremity pain (Secondary Area of Pain) (Left) 09/14/2018  . Chronic hip pain [facility] Area of Pain) (Bilateral) (L>R) 09/14/2018  . Chronic knee pain (Fourth Area of Pain) (Bilateral) (R>L) 09/14/2018  . Pharmacologic therapy 09/14/2018  . Disorder of skeletal system 09/14/2018  . Problems influencing health status 09/14/2018  . Primary osteoarthritis of first carpometacarpal joint of left hand 04/20/2018  . 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 01/16/2018  . PAH (pulmonary artery hypertension) (HCC) 08/03/2017  . Advanced care planning/counseling discussion 06/25/2017  . Synovial cyst of lumbar facet joint 11/13/2016  . Thoracic aortic atherosclerosis (HCC)   . CAD (coronary artery disease) 07/30/2016  . Centrilobular emphysema (HCC) 07/30/2016  . Ex-smoker 07/08/2016  . Nonspecific abnormal electrocardiogram (ECG) (EKG) 07/08/2016  . Chronic lumbar radicular pain (L4) (Bilateral) 06/09/2016  . DOE (dyspnea on exertion) 05/22/2016  . Degenerative joint disease (DJD) of hip 04/17/2016  . Somatic dysfunction of sacroiliac joint 04/17/2016  . S/P Lumbar Fusion (L3-4 PLIF) 04/17/2016  . Cervical fusion syndrome 04/17/2016  . Pedal edema 02/19/2016  . Chronic pain syndrome 02/19/2016  . Abdominal  aortic atherosclerosis (HCC) 12/11/2015  . MRSA (methicillin resistant Staphylococcus aureus) infection 10/11/2012  . Urinary urgency 07/28/2012  . Infection or inflammatory reaction due to internal joint prosthesis (HCC) 12/03/2011  . Morbid obesity with BMI of 60.0-69.9, adult (HCC) 05/13/2011  . Essential hypertension 05/17/2010  . HIP PAIN 12/25/2009  . Cervical radiculopathy (Right) 12/25/2009  . Benign positional vertigo 07/21/2008  . Hyperlipidemia associated with type 2 diabetes mellitus (HCC) 08/16/2007  . Hypothyroidism 07/30/2007  . DM type 2 with diabetic peripheral neuropathy (HCC) 07/30/2007  . ANEMIA-NOS 07/30/2007  . Bipolar disorder (HCC) 07/30/2007  . Allergic rhinitis 07/30/2007  . Asthma 07/30/2007  . GERD 07/30/2007  . Osteoarthritis 07/30/2007  . OSA on CPAP 07/30/2007    Past Surgical History:  Procedure Laterality Date  . BREAST CYST ASPIRATION    . CARPAL TUNNEL RELEASE Right 05/15/2011  . CARPAL TUNNEL RELEASE Left 12/24/2017   Procedure: LEFT CARPAL TUNNEL RELEASE;  Surgeon: [name], MD;  Location: [facility];  Service: Orthopedics;  Laterality: Left;  . CERVICAL FUSION  2012   C2/3/4  . COLONOSCOPY WITH PROPOFOL N/A 12/31/2015   diverticulosis, int hem, o/w normal rpt 10 yrs ([facility])  . EYE SURGERY Bilateral    cataract surgery with lens implant  . FOOT SURGERY  1982   bone spur  . I & D EXTREMITY  09/06/2012   [name], MD; Right;  I&D of right thigh  . I & D EXTREMITY Left 07/2013   wound vac - daily doxycycline indefinitely  . KNEE ARTHROSCOPY  09/01/2012   [name], MD;  Right  . LUMBAR FUSION  01/08/2012   L3-4  . LUMBAR FUSION  02/2019   unexpectedly discovered MRSA infection - pus [name])   . LUMBAR LAMINECTOMY/DECOMPRESSION MICRODISCECTOMY N/A 11/13/2016   LAMINOTOMY/LAMINECTOMY LUMBAR FOUR LUMBAR FIVE  WITH RESECTION OF SYNOVIAL CYST;  Surgeon: Newman Pies, MD  . NECK SURGERY     Herniated disk C2,3,4  .  NOSE SURGERY    . PARTIAL HYSTERECTOMY  1984   for mennorhagia, ovaries remain  . REVISION TOTAL HIP ARTHROPLASTY Left 08/28/2011  . TMJ ARTHROPLASTY  1982  . TOTAL HIP ARTHROPLASTY Left 2002  . TRIGGER FINGER RELEASE Right 05/15/2011   long finger     OB History   No obstetric history on file.     Family History  Problem Relation Age of Onset  . Aneurysm Father 60       brain  . Alcohol abuse Father   . Cancer Father        possibly  . CAD Other        several siblings  . Cancer Brother        prostate  . Diabetes Brother   . Diabetes Sister   . Anesthesia problems Neg Hx   . Hypotension Neg Hx   . Malignant hyperthermia Neg Hx   . Pseudochol deficiency Neg Hx   . Breast cancer Neg Hx     Social History   Tobacco Use  . Smoking status: Former Smoker    Packs/day: 1.50    Years: 40.00    Pack years: 60.00    Types: Cigarettes    Quit date: 04/29/2009    Years since quitting: 10.5  . Smokeless tobacco: Never Used  Substance Use Topics  . Alcohol use: Yes    Alcohol/week: 21.0 standard drinks    Types: 21 Shots of liquor per week    Comment: 3 shots/per night  . Drug use: No    Home Medications Prior to Admission medications   Medication Sig Start Date End Date Taking? Authorizing Provider  albuterol (PROVENTIL HFA;VENTOLIN HFA) 108 (90 Base) MCG/ACT inhaler Inhale 2 puffs into the lungs every 6 (six) hours as needed for wheezing or shortness of breath. 12/14/18   Ria Bush, MD  aspirin 81 MG EC tablet Take 81 mg by mouth at bedtime.     [provider]  budesonide-formoterol (SYMBICORT) 160-4.5 MCG/ACT inhaler Inhale 2 puffs into the lungs 2 (two) times daily. 11/07/19   Ria Bush, MD  celecoxib (CELEBREX) 200 MG capsule TAKE ONE CAPSULE BY MOUTH DAILY 06/14/19   Ria Bush, MD  Cholecalciferol (VITAMIN D3) 25 MCG (1000 UT) CAPS Take 1 capsule (1,000 Units total) by mouth daily. Patient not taking: Reported on 11/04/2019 08/11/19    Ria Bush, MD  docusate sodium (COLACE) 100 MG capsule Take 1 capsule (100 mg total) by mouth 2 (two) times daily. 11/06/19   Cristal Deer, MD  doxycycline (VIBRA-TABS) 100 MG tablet Take  100 mg by mouth 2 (two) times daily. 07/31/19   [provider]  FLUoxetine (PROZAC) 40 MG capsule Take by mouth daily. 03/08/19   [provider]  lamoTRIgine (LAMICTAL) 200 MG tablet Take 200 mg by mouth daily.      [provider]  levothyroxine (SYNTHROID) 150 MCG tablet Take 1 tablet (150 mcg total) by mouth at bedtime. 01/21/19   [name], MD  methocarbamol (ROBAXIN-750) 750 MG tablet Take 1 tablet (750 mg total) by mouth every 6 (six) hours as needed for muscle spasms. 03/25/19   Bergman, Lindie Spruce D, NP  montelukast (SINGULAIR) 10 MG tablet TAKE 1 TABLET BY MOUTH EVERYDAY AT BEDTIME Patient taking differently: Take 10 mg by mouth at bedtime.  02/28/19   [name], MD  Multiple Vitamin (MULTIVITAMIN WITH MINERALS) TABS tablet Take 2 tablets by mouth daily.     [provider]  nystatin (MYCOSTATIN/NYSTOP) powder Apply topically 4 (four) times daily. Patient taking differently: Apply 1 application topically 4 (four) times daily as needed (rash).  02/22/19   Reva Bores, MD  ondansetron (ZOFRAN ODT) 8 MG disintegrating tablet Take 1 tablet (8 mg total) by mouth 2 (two) times daily as needed for nausea or vomiting. 11/02/19   [name], MD  Oxcarbazepine (TRILEPTAL) 300 MG tablet Take 300-600 mg by mouth See admin instructions. Take 300 mg by mouth in the morning and 600 mg at night    [provider]  oxybutynin (DITROPAN-XL) 10 MG 24 hr tablet Take 1 tablet (10 mg total) by mouth daily. 11/03/19   Jerilee Field, MD  oxyCODONE 10 MG TABS Take 1 tablet (10 mg total) by mouth every 4 (four) hours as needed for severe pain ((score 7 to 10)). 03/25/19   Val Eagle D, NP  pantoprazole (PROTONIX) 40 MG tablet Take 1 tablet (40 mg total) by  mouth daily. Patient taking differently: Take 40 mg by mouth 2 (two) times daily.  01/21/19   [name], MD  promethazine (PHENERGAN) 25 MG suppository Place 1 suppository (25 mg total) rectally every 6 (six) hours as needed for nausea or vomiting. 11/08/19   [name], MD  simvastatin (ZOCOR) 40 MG tablet Take 40 mg by mouth every evening.    [provider]  sucralfate (CARAFATE) 1 g tablet Take 1 tablet (1 g total) by mouth 4 (four) times daily -  with meals and at bedtime. 11/03/19   [name], MD  traMADol (ULTRAM) 50 MG tablet Take 50-100 mg by mouth every 6 (six) hours as needed for moderate pain.  04/27/19   [provider]    Allergies    Cephalexin, Hydrocodone, Tolterodine tartrate, Risperidone and related, Seroquel [quetiapine fumarate], and Sulfa antibiotics  Review of Systems   Review of Systems  Constitutional: Negative for chills and fever.  HENT: Negative.   Respiratory: Negative for cough and shortness of breath.   Cardiovascular: Negative for chest pain.  Gastrointestinal: Positive for abdominal pain, nausea and vomiting.  Genitourinary: Negative for dysuria and frequency.  Musculoskeletal: Negative for arthralgias and myalgias.  All other systems reviewed and are negative.   Physical Exam Updated Vital Signs BP (!) 164/127 (BP Location: Left Arm)   Pulse (!) 108   Temp 98.1 F (36.7 C) (Oral)   Resp 20   Ht  (1.626 m)   Wt (!) 166.8 kg   LMP  (LMP Unknown)   SpO2 91%   BMI 63.13 kg/m   Physical Exam Vitals and  nursing note reviewed.  Constitutional:      General: She is not in acute distress.    Appearance: She is well-developed. She is obese. She is not diaphoretic.     Comments: Patient appears uncomfortable but is not in acute distress  HENT:     Head: Normocephalic and atraumatic.     Mouth/Throat:     Comments: Mucous membranes slightly dry Eyes:     General:        Right eye: No discharge.        Left  eye: No discharge.     Pupils: Pupils are equal, round, and reactive to light.  Cardiovascular:     Rate and Rhythm: Regular rhythm. Tachycardia present.     Heart sounds: Normal heart sounds. No murmur. No friction rub. No gallop.      Comments: Mild tachycardia with regular rhythm Pulmonary:     Effort: Pulmonary effort is normal. No respiratory distress.     Breath sounds: Normal breath sounds. No wheezing or rales.     Comments: Respirations equal and unlabored, patient able to speak in full sentences, lungs clear to auscultation bilaterally Abdominal:     General: Bowel sounds are normal. There is no distension.     Palpations: Abdomen is soft. There is no mass.     Tenderness: There is generalized abdominal tenderness. There is no guarding.     Comments: Obese abdomen soft and nondistended, exam is limited due to voluntary guarding, but patient has generalized tenderness to palpation.  No focal area of pain noted.  Musculoskeletal:        General: No deformity.     Cervical back: Neck supple.  Skin:    General: Skin is warm and dry.     Capillary Refill: Capillary refill takes less than 2 seconds.  Neurological:     Mental Status: She is alert.     Coordination: Coordination normal.     Comments: Speech is clear, able to follow commands Moves extremities without ataxia, coordination intact  Psychiatric:        Mood and Affect: Mood normal.        Behavior: Behavior normal.     ED Results / Procedures / Treatments   Labs (all labs ordered are listed, but only abnormal results are displayed) Labs Reviewed  COMPREHENSIVE METABOLIC PANEL  CBC WITH DIFFERENTIAL/PLATELET  LIPASE, BLOOD  URINALYSIS, ROUTINE W REFLEX MICROSCOPIC  LACTIC ACID, PLASMA  LACTIC ACID, PLASMA    EKG None  Radiology No results found.  Procedures Procedures (including critical care time)  Medications Ordered in ED Medications  sodium chloride 0.9 % bolus 1,000 mL (has no administration in  time range)  ondansetron (ZOFRAN) injection 4 mg (has no administration in time range)  morphine 4 MG/ML injection 4 mg (has no administration in time range)  famotidine (PEPCID) IVPB 20 mg premix (has no administration in time range)    ED Course  I have reviewed the triage vital signs and the nursing notes.  Pertinent labs & imaging results that were available during my care of the patient were reviewed by me and considered in my medical decision making (see chart for details).    MDM Rules/Calculators/A&P                      70 year old female returns to the emergency department for recurrence of generalized abdominal pain and nausea and vomiting, she was recently discharged from the hospital after admission for  similar symptoms with AKI.  She has been seen in the ED 2 other times for the symptoms as well.  They have not found a specific cause for her pain, nausea and vomiting, it was thought to one-point be related to pain, but this has been stopped and symptoms have continued.  On arrival patient appears uncomfortable she is mildly tachycardic and hypertensive, but is afebrile.  Initially came in on 2 L nasal cannula, but is satting well on room air, lungs are clear.  Abdomen with generalized tenderness, exam is limited due to voluntary guarding.  Given that his symptoms are worsening will get repeat labs and a repeat CT abdomen pelvis, her last scan when symptoms began on 2/1 was reassuring.  Will give IV fluids, pain and nausea medication.  Delay in getting lab work due to difficulties with IV.  At shift change I do not have any of patient's work-up back yet, care has been signed out to NP Felicie Morn who will follow up on labs and CT, and reevaluate patient's symptoms.  He was disposition appropriately.  Case has also been discussed with Dr. Manus Gunning, who is in agreement with plan.  Final Clinical Impression(s) / ED Diagnoses Final diagnoses:  Nausea and vomiting, intractability of  vomiting not specified, unspecified vomiting type  Generalized abdominal pain    Rx / DC Orders ED Discharge Orders    None       Legrand Rams 11/08/19 2057    Glynn Octave, MD 11/09/19 0230

## 2019-11-09 ENCOUNTER — Encounter (HOSPITAL_COMMUNITY): Payer: Self-pay

## 2019-11-09 ENCOUNTER — Inpatient Hospital Stay (HOSPITAL_COMMUNITY): Payer: Medicare Other

## 2019-11-09 ENCOUNTER — Emergency Department (HOSPITAL_COMMUNITY): Payer: Medicare Other

## 2019-11-09 DIAGNOSIS — Z8042 Family history of malignant neoplasm of prostate: Secondary | ICD-10-CM | POA: Diagnosis not present

## 2019-11-09 DIAGNOSIS — Z8614 Personal history of Methicillin resistant Staphylococcus aureus infection: Secondary | ICD-10-CM | POA: Diagnosis not present

## 2019-11-09 DIAGNOSIS — R9431 Abnormal electrocardiogram [ECG] [EKG]: Secondary | ICD-10-CM | POA: Diagnosis not present

## 2019-11-09 DIAGNOSIS — Z20822 Contact with and (suspected) exposure to covid-19: Secondary | ICD-10-CM | POA: Diagnosis present

## 2019-11-09 DIAGNOSIS — R1084 Generalized abdominal pain: Secondary | ICD-10-CM | POA: Diagnosis not present

## 2019-11-09 DIAGNOSIS — F319 Bipolar disorder, unspecified: Secondary | ICD-10-CM | POA: Diagnosis not present

## 2019-11-09 DIAGNOSIS — Z9189 Other specified personal risk factors, not elsewhere classified: Secondary | ICD-10-CM | POA: Diagnosis not present

## 2019-11-09 DIAGNOSIS — E785 Hyperlipidemia, unspecified: Secondary | ICD-10-CM | POA: Diagnosis present

## 2019-11-09 DIAGNOSIS — I5023 Acute on chronic systolic (congestive) heart failure: Secondary | ICD-10-CM | POA: Diagnosis not present

## 2019-11-09 DIAGNOSIS — I509 Heart failure, unspecified: Secondary | ICD-10-CM | POA: Diagnosis not present

## 2019-11-09 DIAGNOSIS — J810 Acute pulmonary edema: Secondary | ICD-10-CM

## 2019-11-09 DIAGNOSIS — I248 Other forms of acute ischemic heart disease: Secondary | ICD-10-CM | POA: Diagnosis present

## 2019-11-09 DIAGNOSIS — J45909 Unspecified asthma, uncomplicated: Secondary | ICD-10-CM | POA: Diagnosis not present

## 2019-11-09 DIAGNOSIS — K219 Gastro-esophageal reflux disease without esophagitis: Secondary | ICD-10-CM | POA: Diagnosis not present

## 2019-11-09 DIAGNOSIS — I5181 Takotsubo syndrome: Secondary | ICD-10-CM | POA: Diagnosis not present

## 2019-11-09 DIAGNOSIS — R109 Unspecified abdominal pain: Secondary | ICD-10-CM

## 2019-11-09 DIAGNOSIS — R112 Nausea with vomiting, unspecified: Secondary | ICD-10-CM | POA: Diagnosis not present

## 2019-11-09 DIAGNOSIS — Z7982 Long term (current) use of aspirin: Secondary | ICD-10-CM | POA: Diagnosis not present

## 2019-11-09 DIAGNOSIS — I214 Non-ST elevation (NSTEMI) myocardial infarction: Secondary | ICD-10-CM | POA: Diagnosis not present

## 2019-11-09 DIAGNOSIS — D638 Anemia in other chronic diseases classified elsewhere: Secondary | ICD-10-CM | POA: Diagnosis not present

## 2019-11-09 DIAGNOSIS — R7989 Other specified abnormal findings of blood chemistry: Secondary | ICD-10-CM | POA: Diagnosis not present

## 2019-11-09 DIAGNOSIS — R000 Tachycardia, unspecified: Secondary | ICD-10-CM | POA: Diagnosis not present

## 2019-11-09 DIAGNOSIS — K3184 Gastroparesis: Secondary | ICD-10-CM | POA: Diagnosis present

## 2019-11-09 DIAGNOSIS — I447 Left bundle-branch block, unspecified: Secondary | ICD-10-CM | POA: Diagnosis present

## 2019-11-09 DIAGNOSIS — J9601 Acute respiratory failure with hypoxia: Secondary | ICD-10-CM | POA: Diagnosis not present

## 2019-11-09 DIAGNOSIS — R1110 Vomiting, unspecified: Secondary | ICD-10-CM

## 2019-11-09 DIAGNOSIS — Z8249 Family history of ischemic heart disease and other diseases of the circulatory system: Secondary | ICD-10-CM | POA: Diagnosis not present

## 2019-11-09 DIAGNOSIS — I5020 Unspecified systolic (congestive) heart failure: Secondary | ICD-10-CM | POA: Diagnosis not present

## 2019-11-09 DIAGNOSIS — R0602 Shortness of breath: Secondary | ICD-10-CM | POA: Diagnosis not present

## 2019-11-09 DIAGNOSIS — Z833 Family history of diabetes mellitus: Secondary | ICD-10-CM | POA: Diagnosis not present

## 2019-11-09 DIAGNOSIS — E1165 Type 2 diabetes mellitus with hyperglycemia: Secondary | ICD-10-CM | POA: Diagnosis present

## 2019-11-09 DIAGNOSIS — R778 Other specified abnormalities of plasma proteins: Secondary | ICD-10-CM | POA: Diagnosis not present

## 2019-11-09 DIAGNOSIS — E1143 Type 2 diabetes mellitus with diabetic autonomic (poly)neuropathy: Secondary | ICD-10-CM | POA: Diagnosis present

## 2019-11-09 DIAGNOSIS — D649 Anemia, unspecified: Secondary | ICD-10-CM | POA: Diagnosis not present

## 2019-11-09 DIAGNOSIS — Z87891 Personal history of nicotine dependence: Secondary | ICD-10-CM | POA: Diagnosis not present

## 2019-11-09 DIAGNOSIS — I5021 Acute systolic (congestive) heart failure: Secondary | ICD-10-CM | POA: Diagnosis present

## 2019-11-09 DIAGNOSIS — I110 Hypertensive heart disease with heart failure: Secondary | ICD-10-CM | POA: Diagnosis present

## 2019-11-09 DIAGNOSIS — I5041 Acute combined systolic (congestive) and diastolic (congestive) heart failure: Secondary | ICD-10-CM | POA: Diagnosis not present

## 2019-11-09 DIAGNOSIS — G4733 Obstructive sleep apnea (adult) (pediatric): Secondary | ICD-10-CM | POA: Diagnosis present

## 2019-11-09 DIAGNOSIS — Z96643 Presence of artificial hip joint, bilateral: Secondary | ICD-10-CM | POA: Diagnosis present

## 2019-11-09 DIAGNOSIS — E039 Hypothyroidism, unspecified: Secondary | ICD-10-CM | POA: Diagnosis not present

## 2019-11-09 DIAGNOSIS — Z981 Arthrodesis status: Secondary | ICD-10-CM | POA: Diagnosis not present

## 2019-11-09 DIAGNOSIS — F1010 Alcohol abuse, uncomplicated: Secondary | ICD-10-CM | POA: Diagnosis present

## 2019-11-09 DIAGNOSIS — R1032 Left lower quadrant pain: Secondary | ICD-10-CM | POA: Diagnosis not present

## 2019-11-09 DIAGNOSIS — Z6841 Body Mass Index (BMI) 40.0 and over, adult: Secondary | ICD-10-CM | POA: Diagnosis not present

## 2019-11-09 DIAGNOSIS — F419 Anxiety disorder, unspecified: Secondary | ICD-10-CM | POA: Diagnosis not present

## 2019-11-09 DIAGNOSIS — R1013 Epigastric pain: Secondary | ICD-10-CM | POA: Diagnosis not present

## 2019-11-09 LAB — URINALYSIS, ROUTINE W REFLEX MICROSCOPIC
Bacteria, UA: NONE SEEN
Bilirubin Urine: NEGATIVE
Glucose, UA: 150 mg/dL — AB
Hgb urine dipstick: NEGATIVE
Ketones, ur: 20 mg/dL — AB
Leukocytes,Ua: NEGATIVE
Nitrite: NEGATIVE
Protein, ur: 100 mg/dL — AB
Specific Gravity, Urine: >1.046 — ABNORMAL HIGH (ref 1.005–1.030)
pH: 6 (ref 5.0–8.0)

## 2019-11-09 LAB — CBG MONITORING, ED
Glucose-Capillary: 187 mg/dL — ABNORMAL HIGH (ref 70–99)
Glucose-Capillary: 176 mg/dL — ABNORMAL HIGH (ref 70–99)
Glucose-Capillary: 134 mg/dL — ABNORMAL HIGH (ref 70–99)

## 2019-11-09 LAB — BRAIN NATRIURETIC PEPTIDE: B Natriuretic Peptide: 565.6 pg/mL — ABNORMAL HIGH (ref 0.0–100.0)

## 2019-11-09 LAB — MAGNESIUM: Magnesium: 1.7 mg/dL (ref 1.7–2.4)

## 2019-11-09 LAB — LIPID PANEL
Cholesterol: 139 mg/dL (ref 0–200)
HDL: 58 mg/dL (ref >40)
LDL Cholesterol: 73 mg/dL (ref 0–99)
Total CHOL/HDL Ratio: 2.4 RATIO
Triglycerides: 41 mg/dL (ref <150)
VLDL: 8 mg/dL (ref 0–40)

## 2019-11-09 LAB — HEPARIN LEVEL (UNFRACTIONATED)
Heparin Unfractionated: 0.68 IU/mL (ref 0.30–0.70)
Heparin Unfractionated: 0.11 IU/mL — ABNORMAL LOW (ref 0.30–0.70)

## 2019-11-09 LAB — TSH: TSH: 1.05 u[IU]/mL (ref 0.350–4.500)

## 2019-11-09 LAB — APTT: aPTT: 27 seconds (ref 24–36)

## 2019-11-09 LAB — TROPONIN I (HIGH SENSITIVITY)
Troponin I (High Sensitivity): 309 ng/L (ref <18)
Troponin I (High Sensitivity): 318 ng/L (ref <18)
Troponin I (High Sensitivity): 393 ng/L (ref <18)

## 2019-11-09 LAB — PROCALCITONIN: Procalcitonin: <0.1 ng/mL

## 2019-11-09 LAB — GLUCOSE, CAPILLARY: Glucose-Capillary: 135 mg/dL — ABNORMAL HIGH (ref 70–99)

## 2019-11-09 LAB — PROTIME-INR
INR: 1 (ref 0.8–1.2)
Prothrombin Time: 12.8 seconds (ref 11.4–15.2)

## 2019-11-09 LAB — ECHOCARDIOGRAM LIMITED
Height: 64 in
Weight: 5884.8 oz

## 2019-11-09 LAB — SARS CORONAVIRUS 2 (TAT 6-24 HRS): SARS Coronavirus 2: NEGATIVE

## 2019-11-09 MED ORDER — ASPIRIN EC 81 MG PO TBEC
81.0000 mg | DELAYED_RELEASE_TABLET | Freq: Every day | ORAL | Status: DC
  Administered 2019-11-09 – 2019-11-11 (×2): 81 mg via ORAL
  Filled 2019-11-09 (×3): qty 1

## 2019-11-09 MED ORDER — PROCHLORPERAZINE EDISYLATE 10 MG/2ML IJ SOLN
10.0000 mg | Freq: Four times a day (QID) | INTRAMUSCULAR | Status: DC | PRN
  Administered 2019-11-10 – 2019-11-13 (×5): 10 mg via INTRAVENOUS
  Filled 2019-11-09 (×5): qty 2

## 2019-11-09 MED ORDER — THIAMINE HCL 100 MG PO TABS
100.0000 mg | ORAL_TABLET | Freq: Every day | ORAL | Status: DC
  Administered 2019-11-09 – 2019-11-14 (×6): 100 mg via ORAL
  Filled 2019-11-09 (×6): qty 1

## 2019-11-09 MED ORDER — THIAMINE HCL 100 MG/ML IJ SOLN
100.0000 mg | Freq: Every day | INTRAMUSCULAR | Status: DC
  Filled 2019-11-09 (×2): qty 2

## 2019-11-09 MED ORDER — MOMETASONE FURO-FORMOTEROL FUM 200-5 MCG/ACT IN AERO
2.0000 | INHALATION_SPRAY | Freq: Two times a day (BID) | RESPIRATORY_TRACT | Status: DC
  Administered 2019-11-09 – 2019-11-14 (×10): 2 via RESPIRATORY_TRACT
  Filled 2019-11-09: qty 8.8

## 2019-11-09 MED ORDER — LORAZEPAM 1 MG PO TABS
1.0000 mg | ORAL_TABLET | ORAL | Status: AC | PRN
  Administered 2019-11-09: 1 mg via ORAL
  Filled 2019-11-09: qty 2

## 2019-11-09 MED ORDER — SODIUM CHLORIDE (PF) 0.9 % IJ SOLN
INTRAMUSCULAR | Status: AC
  Filled 2019-11-09: qty 50

## 2019-11-09 MED ORDER — ADULT MULTIVITAMIN W/MINERALS CH
1.0000 | ORAL_TABLET | Freq: Every day | ORAL | Status: DC
  Administered 2019-11-09 – 2019-11-14 (×6): 1 via ORAL
  Filled 2019-11-09 (×6): qty 1

## 2019-11-09 MED ORDER — INSULIN ASPART 100 UNIT/ML ~~LOC~~ SOLN
0.0000 [IU] | SUBCUTANEOUS | Status: DC
  Administered 2019-11-09: 2 [IU] via SUBCUTANEOUS
  Administered 2019-11-09 (×2): 1 [IU] via SUBCUTANEOUS
  Administered 2019-11-09 – 2019-11-10 (×2): 2 [IU] via SUBCUTANEOUS
  Administered 2019-11-10 – 2019-11-11 (×6): 1 [IU] via SUBCUTANEOUS
  Administered 2019-11-11: 2 [IU] via SUBCUTANEOUS
  Administered 2019-11-11 – 2019-11-13 (×7): 1 [IU] via SUBCUTANEOUS
  Administered 2019-11-13: 2 [IU] via SUBCUTANEOUS
  Administered 2019-11-13 – 2019-11-14 (×3): 1 [IU] via SUBCUTANEOUS
  Filled 2019-11-09: qty 0.09

## 2019-11-09 MED ORDER — SODIUM CHLORIDE 0.9% FLUSH: 3.0000 mL | INTRAVENOUS | Status: DC | PRN

## 2019-11-09 MED ORDER — FUROSEMIDE 10 MG/ML IJ SOLN
40.0000 mg | Freq: Once | INTRAMUSCULAR | Status: AC
  Administered 2019-11-09: 40 mg via INTRAVENOUS
  Filled 2019-11-09: qty 4

## 2019-11-09 MED ORDER — CARVEDILOL 6.25 MG PO TABS
6.2500 mg | ORAL_TABLET | Freq: Two times a day (BID) | ORAL | Status: DC
  Administered 2019-11-09 – 2019-11-14 (×13): 6.25 mg via ORAL
  Filled 2019-11-09 (×13): qty 1

## 2019-11-09 MED ORDER — PERFLUTREN LIPID MICROSPHERE
1.0000 mL | INTRAVENOUS | Status: AC | PRN
  Administered 2019-11-09: 2 mL via INTRAVENOUS
  Filled 2019-11-09: qty 10

## 2019-11-09 MED ORDER — SODIUM CHLORIDE 0.9% FLUSH
3.0000 mL | Freq: Two times a day (BID) | INTRAVENOUS | Status: DC
  Administered 2019-11-09 – 2019-11-13 (×5): 3 mL via INTRAVENOUS

## 2019-11-09 MED ORDER — ONDANSETRON HCL 4 MG/2ML IJ SOLN
4.0000 mg | Freq: Once | INTRAMUSCULAR | Status: AC
  Administered 2019-11-09: 4 mg via INTRAVENOUS
  Filled 2019-11-09: qty 2

## 2019-11-09 MED ORDER — FOLIC ACID 1 MG PO TABS
1.0000 mg | ORAL_TABLET | Freq: Every day | ORAL | Status: DC
  Administered 2019-11-09 – 2019-11-14 (×6): 1 mg via ORAL
  Filled 2019-11-09 (×6): qty 1

## 2019-11-09 MED ORDER — HEPARIN SODIUM (PORCINE) 5000 UNIT/ML IJ SOLN
4000.0000 [IU] | Freq: Once | INTRAMUSCULAR | Status: AC
  Administered 2019-11-09: 4000 [IU] via INTRAVENOUS
  Filled 2019-11-09: qty 1

## 2019-11-09 MED ORDER — SIMVASTATIN 20 MG PO TABS
40.0000 mg | ORAL_TABLET | Freq: Every evening | ORAL | Status: DC
  Administered 2019-11-09 – 2019-11-13 (×6): 40 mg via ORAL
  Filled 2019-11-09: qty 2
  Filled 2019-11-09: qty 1
  Filled 2019-11-09 (×4): qty 2

## 2019-11-09 MED ORDER — HEPARIN BOLUS VIA INFUSION
3000.0000 [IU] | Freq: Once | INTRAVENOUS | Status: AC
  Administered 2019-11-09: 3000 [IU] via INTRAVENOUS
  Filled 2019-11-09: qty 3000

## 2019-11-09 MED ORDER — SODIUM CHLORIDE 0.9 % WEIGHT BASED INFUSION
3.0000 mL/kg/h | INTRAVENOUS | Status: DC
  Administered 2019-11-10: 3 mL/kg/h via INTRAVENOUS

## 2019-11-09 MED ORDER — SODIUM CHLORIDE 0.9 % WEIGHT BASED INFUSION: 1.0000 mL/kg/h | INTRAVENOUS | Status: DC

## 2019-11-09 MED ORDER — ASPIRIN 81 MG PO CHEW
324.0000 mg | CHEWABLE_TABLET | Freq: Once | ORAL | Status: AC
  Administered 2019-11-09: 324 mg via ORAL
  Filled 2019-11-09: qty 4

## 2019-11-09 MED ORDER — SODIUM CHLORIDE 0.9 % IV SOLN: 250.0000 mL | INTRAVENOUS | Status: DC | PRN

## 2019-11-09 MED ORDER — MONTELUKAST SODIUM 10 MG PO TABS
10.0000 mg | ORAL_TABLET | Freq: Every day | ORAL | Status: DC
  Administered 2019-11-09 – 2019-11-13 (×5): 10 mg via ORAL
  Filled 2019-11-09 (×6): qty 1

## 2019-11-09 MED ORDER — HEPARIN (PORCINE) 25000 UT/250ML-% IV SOLN
1800.0000 [IU]/h | INTRAVENOUS | Status: DC
  Administered 2019-11-09: 1400 [IU]/h via INTRAVENOUS
  Administered 2019-11-09: 1700 [IU]/h via INTRAVENOUS
  Filled 2019-11-09 (×2): qty 250

## 2019-11-09 MED ORDER — ALBUTEROL SULFATE (2.5 MG/3ML) 0.083% IN NEBU: 2.5000 mg | INHALATION_SOLUTION | RESPIRATORY_TRACT | Status: DC | PRN

## 2019-11-09 MED ORDER — ASPIRIN 81 MG PO CHEW
81.0000 mg | CHEWABLE_TABLET | ORAL | Status: AC
  Administered 2019-11-10: 81 mg via ORAL
  Filled 2019-11-09: qty 1

## 2019-11-09 MED ORDER — LORAZEPAM 2 MG/ML IJ SOLN: 1.0000 mg | INTRAMUSCULAR | Status: AC | PRN

## 2019-11-09 MED ORDER — IOHEXOL 350 MG/ML SOLN
100.0000 mL | Freq: Once | INTRAVENOUS | Status: AC | PRN
  Administered 2019-11-09: 100 mL via INTRAVENOUS

## 2019-11-09 MED ORDER — MORPHINE SULFATE (PF) 2 MG/ML IV SOLN: 1.0000 mg | INTRAVENOUS | Status: DC | PRN

## 2019-11-09 MED ORDER — SODIUM CHLORIDE 0.9% FLUSH
3.0000 mL | Freq: Two times a day (BID) | INTRAVENOUS | Status: DC
  Administered 2019-11-09 – 2019-11-13 (×6): 3 mL via INTRAVENOUS

## 2019-11-09 MED ORDER — ALBUTEROL SULFATE HFA 108 (90 BASE) MCG/ACT IN AERS: 2.0000 | INHALATION_SPRAY | Freq: Four times a day (QID) | RESPIRATORY_TRACT | Status: DC | PRN

## 2019-11-09 MED ORDER — LEVOTHYROXINE SODIUM 75 MCG PO TABS
150.0000 ug | ORAL_TABLET | Freq: Every day | ORAL | Status: DC
  Administered 2019-11-09 – 2019-11-13 (×5): 150 ug via ORAL
  Filled 2019-11-09: qty 2
  Filled 2019-11-09: qty 1
  Filled 2019-11-09 (×4): qty 2

## 2019-11-09 NOTE — H&P (Addendum)
History and Physical    MISHEEL JOKINEN GBT:[id number] DOB: Sep 03, 1951 DOA: 11/08/2019  PCP: [name], MD Patient coming from: Home  Chief Complaint: Abdominal pain, emesis  HPI: Amy Barnes is a 69 y.o. female with medical history significant of anemia, anxiety, asthma, emphysema, depression with anxiety, bipolar affective disorder, CAD, diet-controlled type 2 diabetes, GERD, hypertension, hyperlipidemia, hypothyroidism presenting to the ED for evaluation of abdominal pain and emesis.  Patient reports having intermittent episodes of emesis and abdominal pain for several weeks.  Her symptoms improved for several days and then she started vomiting again yesterday.  Abdominal pain is epigastric and periumbilical.  No diarrhea or fevers.  No recent sick contacts.  Reports experiencing acute onset substernal nonradiating chest pressure associated with dyspnea while in the ED.  Symptoms resolved after 20 minutes.  Does report prior intermittent episodes of exertional angina.  No additional history could be obtained from the patient.  ED Course: Afebrile.  Slightly tachycardic.  Blood pressure elevated with systolic in the 160s.  Oxygen saturation dropped to 88% on room air, improved with 4 L supplemental oxygen.  Labs showing no leukocytosis.  Lactic acid normal x2.  Lipase and LFTs normal.  UA not suggestive of infection. CT abdomen pelvis showing a punctate 2 mm nonobstructing right renal calculus and a stable fat-containing umbilical hernia. After returning from CT, patient reported chest pain and shortness of breath in the ED.  EKG with LBBB similar to prior tracings, no acute ischemic changes.  High-sensitivity troponin 106> 193> 309. D-dimer elevated at 2.09.  Patient was given aspirin 324 mg.   CT angiogram chest without evidence of acute PE.  Showing widespread multifocal areas of mixed groundglass and consolidative opacities on a background of interlobular septal thickening and cephalized  vascularity with small bilateral pleural effusions, right greater than left.  Findings concerning for CHF with pulmonary edema, superimposed infection not fully excluded. SARS-CoV-2 PCR test pending.  Additional medications administered in the ED include IV Lasix 40 mg, IV Pepcid 20 mg, morphine, Zofran, and 1 L normal saline bolus.  In addition, patient was started on heparin infusion.  Review of Systems:  All systems reviewed and apart from history of presenting illness, are negative.  Past Medical History:  Diagnosis Date  . Allergic rhinitis   . Ambulates with cane    straight  . Anemia   . Anxiety   . Asthma    seasonal  . Bipolar affective disorder (HCC)    takes Synthroid meds for Bipolar  . CAD (coronary artery disease) 07/2016   by CT scan  . Carpal tunnel syndrome    had surgery but occasional still has some issues per patient  . Cataract   . Centrilobular emphysema (HCC) 07/2016   by CT scan - pt not aware of this  . Constipation due to pain medication   . Depression with anxiety   . Diabetes mellitus    type 2 - no meds diet controlled  . GERD (gastroesophageal reflux disease)   . History of blood transfusion   . 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 Hyperlipidemia   . Hypertension   . OSA (obstructive sleep apnea)    no longer using cpap, uses a bed that raises and lowers hob  . Osteoarthritis   . Osteoarthritis   . Osteopenia 08/11/2019   DEXA 07/2019 - T -1.1 R femur (osteopenia)  . Pneumonia   . Restless legs   .  Septic arthritis (HCC) 10/11/2012  . Shingles 06/30/2016  . Shortness of breath    with exertion  . Status post revision of total hip replacement bilateral   prosthetic infection R 2013, L 2015  . SVD (spontaneous vaginal delivery)    x 3  . Thoracic aortic atherosclerosis (HCC) 11/207   by CT    Past Surgical History:  Procedure Laterality Date  . BREAST CYST ASPIRATION    . CARPAL TUNNEL RELEASE  Right 05/15/2011  . CARPAL TUNNEL RELEASE Left 12/24/2017   Procedure: LEFT CARPAL TUNNEL RELEASE;  Surgeon: [name], MD;  Location: [facility];  Service: Orthopedics;  Laterality: Left;  . CERVICAL FUSION  2012   C2/3/4  . COLONOSCOPY WITH PROPOFOL N/A 12/31/2015   diverticulosis, int hem, o/w normal rpt 10 yrs ([facility])  . EYE SURGERY Bilateral    cataract surgery with lens implant  . FOOT SURGERY  1982   bone spur  . I & D EXTREMITY  09/06/2012   Budd Palmer, MD; Right;  I&D of right thigh  . I & D EXTREMITY Left 07/2013   wound vac - daily doxycycline indefinitely  . KNEE ARTHROSCOPY  09/01/2012   Dannielle Huh, MD;  Right  . LUMBAR FUSION  01/08/2012   L3-4  . LUMBAR FUSION  02/2019   unexpectedly discovered MRSA infection - pus Lovell Sheehan)   . LUMBAR LAMINECTOMY/DECOMPRESSION MICRODISCECTOMY N/A 11/13/2016   LAMINOTOMY/LAMINECTOMY LUMBAR FOUR LUMBAR FIVE  WITH RESECTION OF SYNOVIAL CYST;  Surgeon: Tressie Stalker, MD  . NECK SURGERY     Herniated disk C2,3,4  . NOSE SURGERY    . PARTIAL HYSTERECTOMY  1984   for mennorhagia, ovaries remain  . REVISION TOTAL HIP ARTHROPLASTY Left 08/28/2011  . TMJ ARTHROPLASTY  1982  . TOTAL HIP ARTHROPLASTY Left 2002  . TRIGGER FINGER RELEASE Right 05/15/2011   long finger     reports that she quit smoking about 10 years ago. Her smoking use included cigarettes. She has a 60.00 pack-year smoking history. She has never used smokeless tobacco. She reports current alcohol use of about 21.0 standard drinks of alcohol per week. She reports that she does not use drugs.  Allergies  Allergen Reactions  . Cephalexin Hives  . Hydrocodone Other (See Comments)    Reaction:  Hallucinations   . Tolterodine Tartrate Nausea And Vomiting  . Risperidone And Related Other (See Comments)    Reaction:  Made pt excessively sleepy  . Seroquel [Quetiapine Fumarate] Other (See Comments)    Reaction:  Made pt excessively sleepy  . Sulfa  Antibiotics Rash    Family History  Problem Relation Age of Onset  . Aneurysm Father 62       brain  . Alcohol abuse Father   . Cancer Father        possibly  . CAD Other        several siblings  . Cancer Brother        prostate  . Diabetes Brother   . Diabetes Sister   . Anesthesia problems Neg Hx   . Hypotension Neg Hx   . Malignant hyperthermia Neg Hx   . Pseudochol deficiency Neg Hx   . Breast cancer Neg Hx     Prior to Admission medications   Medication Sig Start Date End Date Taking? Authorizing Provider  albuterol (PROVENTIL HFA;VENTOLIN HFA) 108 (90 Base) MCG/ACT inhaler Inhale 2 puffs into the lungs every 6 (six) hours as needed for wheezing or shortness  of breath. 12/14/18  Yes [name], MD  aspirin 81 MG EC tablet Take 81 mg by mouth at bedtime.    Yes [provider]  budesonide-formoterol (SYMBICORT) 160-4.5 MCG/ACT inhaler Inhale 2 puffs into the lungs 2 (two) times daily. 11/07/19  Yes [name], MD  celecoxib (CELEBREX) 200 MG capsule TAKE ONE CAPSULE BY MOUTH DAILY Patient taking differently: Take 200 mg by mouth daily.  06/14/19  Yes [name], MD  docusate sodium (COLACE) 100 MG capsule Take 1 capsule (100 mg total) by mouth 2 (two) times daily. 11/06/19  Yes Myrtie Neither, MD  doxycycline (VIBRA-TABS) 100 MG tablet Take 100 mg by mouth 2 (two) times daily. 07/31/19  Yes [provider]  FLUoxetine (PROZAC) 40 MG capsule Take by mouth daily. 03/08/19  Yes [provider]  lamoTRIgine (LAMICTAL) 200 MG tablet Take 200 mg by mouth daily.     Yes [provider]  levothyroxine (SYNTHROID) 150 MCG tablet Take 1 tablet (150 mcg total) by mouth at bedtime. 01/21/19  Yes [name], MD  methocarbamol (ROBAXIN-750) 750 MG tablet Take 1 tablet (750 mg total) by mouth every 6 (six) hours as needed for muscle spasms. 03/25/19  Yes Bergman, Meghan D, NP  montelukast (SINGULAIR) 10 MG tablet TAKE 1 TABLET BY MOUTH  EVERYDAY AT BEDTIME Patient taking differently: Take 10 mg by mouth at bedtime.  02/28/19  Yes [name], MD  Multiple Vitamin (MULTIVITAMIN WITH MINERALS) TABS tablet Take 2 tablets by mouth daily.    Yes [provider]  nystatin (MYCOSTATIN/NYSTOP) powder Apply topically 4 (four) times daily. Patient taking differently: Apply 1 application topically 4 (four) times daily as needed (rash).  02/22/19  Yes Reva Bores, MD  ondansetron (ZOFRAN ODT) 8 MG disintegrating tablet Take 1 tablet (8 mg total) by mouth 2 (two) times daily as needed for nausea or vomiting. 11/02/19  Yes [name], MD  Oxcarbazepine (TRILEPTAL) 300 MG tablet Take 300-600 mg by mouth See admin instructions. Take 300 mg by mouth in the morning and 600 mg at night   Yes [provider]  oxyCODONE 10 MG TABS Take 1 tablet (10 mg total) by mouth every 4 (four) hours as needed for severe pain ((score 7 to 10)). 03/25/19  Yes Val Eagle D, NP  pantoprazole (PROTONIX) 40 MG tablet Take 1 tablet (40 mg total) by mouth daily. Patient taking differently: Take 40 mg by mouth 2 (two) times daily.  01/21/19  Yes [name], MD  simvastatin (ZOCOR) 40 MG tablet Take 40 mg by mouth every evening.   Yes [provider]  sucralfate (CARAFATE) 1 g tablet Take 1 tablet (1 g total) by mouth 4 (four) times daily -  with meals and at bedtime. 11/03/19  Yes [name], MD  traMADol (ULTRAM) 50 MG tablet Take 50-100 mg by mouth every 6 (six) hours as needed for moderate pain.  04/27/19  Yes [provider]  Cholecalciferol (VITAMIN D3) 25 MCG (1000 UT) CAPS Take 1 capsule (1,000 Units total) by mouth daily. Patient not taking: Reported on 11/04/2019 08/11/19   [name], MD  oxybutynin (DITROPAN-XL) 10 MG 24 hr tablet Take 1 tablet (10 mg total) by mouth daily. 11/03/19   Jerilee Field, MD  promethazine (PHENERGAN) 25 MG suppository Place 1 suppository (25 mg total) rectally every 6  (six) hours as needed for nausea or vomiting. 11/08/19   [name], MD    Physical Exam: Vitals:   11/09/19 0300 11/09/19  0330 11/09/19 0400 11/09/19 0430  BP: (!) 163/95 (!) 163/95 (!) 155/97 (!) 155/93  Pulse: (!) 103 (!) 103 (!) 101 99  Resp: (!) 21 (!) 33 16 19  Temp:      TempSrc:      SpO2: 100% 98% 98% 98%  Weight:      Height:        Physical Exam  Constitutional: She is oriented to person, place, and time. She appears well-developed and well-nourished. No distress.  HENT:  Head: Normocephalic.  Eyes: Right eye exhibits no discharge. Left eye exhibits no discharge.  Neck: JVD present.  Cardiovascular: Normal rate, regular rhythm and intact distal pulses.  Pulmonary/Chest: She is in respiratory distress. She has no wheezes. She has rales.  Rales up to mid lung fields bilaterally On 3 L supplemental oxygen  Abdominal: Soft. Bowel sounds are normal. She exhibits no distension. There is no abdominal tenderness. There is no guarding.  Musculoskeletal:        General: No edema.     Cervical back: Neck supple.  Neurological: She is alert and oriented to person, place, and time.  Skin: Skin is warm and dry. She is not diaphoretic.     Labs on Admission: I have personally reviewed following labs and imaging studies  CBC: Recent Labs  Lab 11/04/19 1535 11/05/19 0510 11/08/19 2057  WBC 11.0* 8.7 8.5  NEUTROABS 7.6  --  7.3  HGB 13.7 13.1 14.1  HCT 40.9 40.0 43.0  MCV 84.9 86.8 86.5  PLT 255 205 288   Basic Metabolic Panel: Recent Labs  Lab 11/03/19 1301 11/04/19 1535 11/05/19 0510 11/06/19 1013 11/08/19 2057  NA 126* 130* 126* 135 138  K 3.5 3.6 3.3* 4.1 4.2  CL 90* 91* 93* 102 100  CO2 28 27 22 25 26   GLUCOSE 181* 165* 145* 172* 217*  BUN 16 34* 39* 28* 11  CREATININE 0.94 2.29* 2.26* 0.94 0.56  CALCIUM 9.2 9.2 8.4* 8.8* 9.0  MG  --  2.1  --   --   --    GFR: Estimated Creatinine Clearance: 107.2 mL/min (by C-G formula based on SCr of 0.56  mg/dL). Liver Function Tests: Recent Labs  Lab 11/04/19 1535 11/05/19 0510 11/08/19 2057  AST 24 22 28   ALT 34 30 32  ALKPHOS 86 76 83  BILITOT 0.9 1.0 0.6  PROT 7.3 6.2* 7.4  ALBUMIN 4.4 3.6 4.2   Recent Labs  Lab 11/04/19 1535 11/08/19 2057  LIPASE 71* 22   No results for input(s): AMMONIA in the last 168 hours. Coagulation Profile: Recent Labs  Lab 11/09/19 0341  INR 1.0   Cardiac Enzymes: No results for input(s): CKTOTAL, CKMB, CKMBINDEX, TROPONINI in the last 168 hours. BNP (last 3 results) No results for input(s): PROBNP in the last 8760 hours. HbA1C: No results for input(s): HGBA1C in the last 72 hours. CBG: Recent Labs  Lab 11/05/19 0502 11/05/19 0807  GLUCAP 136* 136*   Lipid Profile: No results for input(s): CHOL, HDL, LDLCALC, TRIG, CHOLHDL, LDLDIRECT in the last 72 hours. Thyroid Function Tests: No results for input(s): TSH, T4TOTAL, FREET4, T3FREE, THYROIDAB in the last 72 hours. Anemia Panel: No results for input(s): VITAMINB12, FOLATE, FERRITIN, TIBC, IRON, RETICCTPCT in the last 72 hours. Urine analysis:    Component Value Date/Time   COLORURINE YELLOW 11/08/2019 0300   APPEARANCEUR CLEAR 11/08/2019 0300   APPEARANCEUR Clear 03/04/2019 0948   LABSPEC >1.046 (H) 11/08/2019 0300   LABSPEC 1.017 09/25/2012 2123  PHURINE 6.0 11/08/2019 0300   GLUCOSEU 150 (A) 11/08/2019 0300   GLUCOSEU Negative 09/25/2012 2123   HGBUR NEGATIVE 11/08/2019 0300   HGBUR negative 05/17/2010 0946   BILIRUBINUR NEGATIVE 11/08/2019 0300   BILIRUBINUR Negative 03/04/2019 0948   BILIRUBINUR Negative 09/25/2012 2123   KETONESUR 20 (A) 11/08/2019 0300   PROTEINUR 100 (A) 11/08/2019 0300   UROBILINOGEN 0.2 01/14/2019 0934   UROBILINOGEN 1.0 08/30/2012 1511   NITRITE NEGATIVE 11/08/2019 0300   LEUKOCYTESUR NEGATIVE 11/08/2019 0300   LEUKOCYTESUR Negative 09/25/2012 2123    Radiological Exams on Admission: CT ABDOMEN PELVIS WO CONTRAST  Result Date:  11/08/2019 CLINICAL DATA:  Nausea, vomiting, abdominal pain for 2 weeks EXAM: CT ABDOMEN AND PELVIS WITHOUT CONTRAST TECHNIQUE: Multidetector CT imaging of the abdomen and pelvis was performed following the standard protocol without IV contrast. COMPARISON:  10/31/2019 FINDINGS: Lower chest: Left ventricular calcifications are unchanged. No pericardial effusion. Bibasilar interlobular septal thickening and vascular prominence consistent with interstitial edema. Hepatobiliary: No focal liver abnormality is seen. No gallstones, gallbladder wall thickening, or biliary dilatation. Pancreas: Unremarkable. No pancreatic ductal dilatation or surrounding inflammatory changes. Spleen: Normal in size without focal abnormality. Adrenals/Urinary Tract: There is a punctate less than 2 mm nonobstructing calculus right kidney, reference image 44 series 2. No obstructive uropathy. The left kidney is unremarkable. Bladder is grossly normal. The adrenals are unremarkable. Stomach/Bowel: No bowel obstruction or ileus. Normal appendix right lower quadrant. Vascular/Lymphatic: Mild calcified plaque within the distal aorta at the bifurcation. No pathologic adenopathy within the abdomen or pelvis. Reproductive: Status post hysterectomy. No adnexal masses. Other: Small fat containing umbilical hernia unchanged. No free fluid or free gas within the abdomen or pelvis. Musculoskeletal: Left hip arthroplasty is identified. Postsurgical changes lower lumbar spine. No acute or destructive bony lesions. IMPRESSION: 1. Punctate 2 mm nonobstructing right renal calculus. 2. Bibasilar interstitial edema. 3. Stable fat containing umbilical hernia. Electronically Signed   By: Randa Ngo M.D.   On: 11/08/2019 21:22   CT Angio Chest PE W and/or Wo Contrast  Result Date: 11/09/2019 CLINICAL DATA:  Shortness of breath, elevated troponin and D-dimer EXAM: CT ANGIOGRAPHY CHEST WITH CONTRAST TECHNIQUE: Multidetector CT imaging of the chest was  performed using the standard protocol during bolus administration of intravenous contrast. Multiplanar CT image reconstructions and MIPs were obtained to evaluate the vascular anatomy. CONTRAST:  126mL OMNIPAQUE IOHEXOL 350 MG/ML SOLN COMPARISON:  CT chest 08/29/2019 FINDINGS: Cardiovascular: Satisfactory opacification the pulmonary arteries to the segmental level. No pulmonary artery filling defects are identified. Central pulmonary arteries are normal caliber. Cardiac size is borderline enlarged. There is dense calcification of mitral annulus. Left ventricular calcification likely reflects sequela of prior infarct. There is lipomatous hypertrophy of the intra-atrial septum. Three-vessel coronary artery calcifications are noted. Atherosclerotic plaque within the normal caliber aorta. Minimal plaque within the normally branching great vessels. Mediastinum/Nodes: Scattered low-attenuation mediastinal and hilar adenopathy with several borderline enlarged lymph nodes including the 12 mm precarinal lymph node (4/40) and a 16 mm subcarinal lymph node (4/50). No mediastinal fluid or gas. No acute abnormality of the trachea or esophagus. Posterior bowing of the trachea likely related to imaging during exhalation for the angiographic technique. Included portions of thyroid gland are unremarkable. Lungs/Pleura: Widespread multifocal areas of mixed ground-glass and consolidative opacity on a background interlobular septal thickening and cephalized vascularity with small bilateral pleural effusions, right greater than left. Atelectatic changes are present in the lungs with some mosaic attenuation which could reflect imaging during exhalation or  small airways disease/air trapping. Upper Abdomen: No acute abnormalities present in the visualized portions of the upper abdomen. Musculoskeletal: Multilevel degenerative changes are present in the imaged portions of the spine. No acute osseous abnormality or suspicious osseous lesion.  Prior cervical spinal fusion is partially included in the level of imaging without acute hardware complication. Review of the MIP images confirms the above findings. IMPRESSION: 1. No evidence of acute pulmonary embolism. 2. Widespread multifocal areas of mixed ground-glass and consolidative opacity on a background of interlobular septal thickening and cephalized vascularity with small bilateral pleural effusions, right greater than left. Findings are favored to reflect CHF with pulmonary edema though superimposed infection is not fully excluded. 3. Scattered borderline enlarged mediastinal lymph nodes are likely reactive in etiology. 4. Three-vessel coronary artery calcifications. Left ventricular calcification likely reflects sequela of prior infarct. 5. Lipomatous hypertrophy of the intra-atrial septum. 6. Aortic Atherosclerosis (ICD10-I70.0). Electronically Signed   By: Kreg Shropshire M.D.   On: 11/09/2019 02:14    EKG: Independently reviewed.  Sinus tachycardia, LBBB, QTC 504.  Rate and QT interval increased since prior tracing.  Assessment/Plan Principal Problem:   NSTEMI (non-ST elevated myocardial infarction) (HCC) Active Problems:   Acute respiratory failure with hypoxia (HCC)   Acute pulmonary edema (HCC)   Abdominal pain with vomiting   QT prolongation   NSTEMI Does have significant risk factors for CAD.  EKG with LBBB similar to prior tracings, no acute ischemic changes. High-sensitivity troponin 106> 193> 309.  Currently chest pain-free. -ED provider discussed the case with Dr. Daphine Deutscher from cardiology who recommended transferring [facility].  Cardiology team will see the patient in the morning and decide whether she needs to undergo cardiac catheterization. -Cardiac monitoring -Received full dose aspirin in the ED -Heparin bolus and infusion -Trend troponin -Repeat echocardiogram -Check BNP level  Acute hypoxic respiratory failure secondary to acute cardiogenic  pulmonary edema Oxygen saturation dropped to 88% on room air, improved with 4 L supplemental oxygen. CT angiogram chest without evidence of acute PE.  Showing widespread multifocal areas of mixed groundglass and consolidative opacities on a background of interlobular septal thickening and cephalized vascularity with small bilateral pleural effusions, right greater than left.  Findings concerning for CHF with pulmonary edema. No prior documented history of CHF. Recent echo done 11/05/2019 with LVEF 60 to 65% and grade 1 diastolic dysfunction.  Suspect cardiogenic pulmonary edema related to ACS.  Pneumonia less likely given no fever or leukocytosis.  Lactic acid normal x2. -Cardiology will consult in the a.m. as mentioned above -Received a dose of IV Lasix 40 mg in the ED -Monitor intake and output, daily weights -Repeat echocardiogram -Check procalcitonin level -Continuous pulse ox, supplemental oxygen as needed to keep oxygen saturation above 92%  Abdominal pain, emesis Differentials include atypical presentation of ACS versus viral gastroenteritis.  Lipase and LFTs normal.  UA not suggestive of infection.  CT abdomen pelvis without clear explanation for the patient's symptoms. -Compazine as needed for nausea/vomiting -Morphine as needed for pain -SARS-CoV-2 PCR test pending  QT prolongation on EKG QTC 504.  Patient has been taking Zofran at home for nausea/vomiting which is likely contributing. -Cardiac monitoring -Keep potassium above 4 and magnesium above 2 -Avoid QT prolonging drugs if possible -Repeat EKG in a.m.  Elevated D-dimer CT angiogram negative for PE. -Bilateral lower extremity Dopplers -Can be seen with COVID-19 viral infection, SARS-CoV-2 PCR test pending  Asthma -Stable.  No bronchospasm.  Continue home inhalers and Singulair.  Hypothyroidism -Continue Synthroid -Check TSH level  Hyperlipidemia -Continue Zocor -Check lipid panel  Diet-controlled type 2  diabetes -Check A1c.  Sliding scale insulin sensitive and CBG checks.  Alcohol use disorder Drinks 3 shots of liquor each night. -CIWA protocol; Ativan as needed -Thiamine, folate, multivitamin  DVT prophylaxis: Heparin Code Status: Full code Family Communication: No family available at this time. Disposition Plan: Anticipate discharge after clinical improvement. Consults called: Cardiology (Dr. Daphine Deutscher) Admission status: It is my clinical opinion that admission to INPATIENT is reasonable and necessary in this 69 y.o. female . presenting with ACS and acute hypoxic respiratory failure secondary to pulmonary edema/new onset CHF.  Very high risk of decompensation.  Given the aforementioned, the predictability of an adverse outcome is felt to be significant. I expect that the patient will require at least 2 midnights in the hospital to treat this condition.   The medical decision making on this patient was of high complexity and the patient is at high risk for clinical deterioration, therefore this is a level 3 visit.  John Giovanni MD [facility]  If 7PM-7AM, please contact night-coverage [email or web address] Password [email or web address]  11/09/2019, 4:55 AM

## 2019-11-09 NOTE — Progress Notes (Signed)
ANTICOAGULATION CONSULT NOTE - Initial Consult  Pharmacy Consult for Heparin Indication: chest pain/ACS  Allergies  Allergen Reactions  . Cephalexin Hives  . Hydrocodone Other (See Comments)    Reaction:  Hallucinations   . Tolterodine Tartrate Nausea And Vomiting  . Risperidone And Related Other (See Comments)    Reaction:  Made pt excessively sleepy  . Seroquel [Quetiapine Fumarate] Other (See Comments)    Reaction:  Made pt excessively sleepy  . Sulfa Antibiotics Rash    Patient Measurements: Height: 5\' 4"  (162.6 cm) Weight: (!) 367 lb 12.8 oz (166.8 kg) IBW/kg (Calculated) : 54.7 Heparin Dosing Weight:   Vital Signs: Temp: 98.1 F (36.7 C) (02/09 1814) Temp Source: Oral (02/09 1814) BP: 163/95 (02/10 0300) Pulse Rate: 103 (02/10 0300)  Labs: Recent Labs    11/06/19 1013 11/08/19 2057 11/08/19 2309 11/09/19 0230  HGB  --  14.1  --   --   HCT  --  43.0  --   --   PLT  --  288  --   --   CREATININE 0.94 0.56  --   --   TROPONINIHS  --  106* 193* 309*    Estimated Creatinine Clearance: 107.2 mL/min (by C-G formula based on SCr of 0.56 mg/dL).   Medical History: Past Medical History:  Diagnosis Date  . Allergic rhinitis   . Ambulates with cane    straight  . Anemia   . Anxiety   . Asthma    seasonal  . Bipolar affective disorder (HCC)    takes Synthroid meds for Bipolar  . CAD (coronary artery disease) 07/2016   by CT scan  . Carpal tunnel syndrome    had surgery but occasional still has some issues per patient  . Cataract   . Centrilobular emphysema (HCC) 07/2016   by CT scan - pt not aware of this  . Constipation due to pain medication   . Depression with anxiety   . Diabetes mellitus    type 2 - no meds diet controlled  . GERD (gastroesophageal reflux disease)   . History of blood transfusion   . 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 Hyperlipidemia   . Hypertension   . OSA (obstructive sleep apnea)    no longer using cpap, uses a bed that raises and lowers hob  . Osteoarthritis   . Osteoarthritis   . Osteopenia 08/11/2019   DEXA 07/2019 - T -1.1 R femur (osteopenia)  . Pneumonia   . Restless legs   . Septic arthritis (HCC) 10/11/2012  . Shingles 06/30/2016  . Shortness of breath    with exertion  . Status post revision of total hip replacement bilateral   prosthetic infection R 2013, L 2015  . SVD (spontaneous vaginal delivery)    x 3  . Thoracic aortic atherosclerosis (HCC) 11/207   by CT    Medications:  Infusions:  . heparin      Assessment: Patient with ACS and No oral anticoagulants noted on med rec.   Baseline coags ordered.    Goal of Therapy:  Heparin level 0.3-0.7 units/ml Monitor platelets by anticoagulation protocol: Yes   Plan:  Heparin bolus 4000  units iv x1 Heparin drip at 1400  units/hr Daily CBC Next heparin level at [address], [place] 11/09/2019,3:36 AM

## 2019-11-09 NOTE — Progress Notes (Signed)
  Echocardiogram 2D Echocardiogram has been performed.  11/09/2019, 11:54 AM

## 2019-11-09 NOTE — Consult Note (Addendum)
Cardiology Consultation:   Patient ID: SYAN CULLIMORE; [id number]; [birth year]   Admit date: 11/08/2019 Date of Consult: 11/09/2019  Primary Care Provider: [name], MD Primary Cardiologist: New to [facility]   Patient Profile:   Amy Barnes is a 69 y.o. female with a hx of morbid obesity, anemia, anxiety, asthma, emphysema, depression, bipolar affective disorder, previously diet controlled DM 2 (hemoglobin A1c 7.2 this admission), hypertension, hyperlipidemia and hypothyroidism who is being seen today for the evaluation of NSTEMI at the request of Dr. Loney Loh.  History of Present Illness:   [name] is a 69 year old female with a history stated above who presented to [facility] on 11/09/2019 for the evaluation of abdominal pain, nausea and vomiting.  Patient reports that she has had intermittent episodes of the above for several weeks and has been seen and recently hospitalized for similar symptoms.  She was most recently discharged on Sunday with symptom improvement however returned today given worsening abdominal pain and vomiting.  During her ED course, she reported an episode of midsternal chest heaviness with associated dyspnea after returning from CT.  Patient states that her symptoms lasted approximately 20 minutes with this patient on its own.  She states that she is experienced similar symptoms in the past which typically occur with minimal exertion.  She has asthma for which she is prescribed inhalers and reports that during times that she is more short of breath and needing increased frequency of inhaler, she has noticed that the chest heaviness is more prominent.  She has no prior documented history of CAD however has a strong family history in her mother, brother and sister.  Brother and sister who are both older have had bypass surgery and mother with a prior MI who is now deceased.  She has a history of tobacco use for approximately 30 to 40 years however has since quit  for the last 11.  She is morbidly obese with a BMI of 63.  She reports she was previously seen by cardiologist while living in [place] many years ago for hypertension.  She did wear a monitor in the past for palpitations which was found to be normal.  She has had no invasive cardiac work-up.  Strong cardiac risk factors above.  In the ED, patient with found to be mildly hypoxic on presentation with an O2 saturation of 88% on room air which improved with supplemental oxygen.  CT of abdomen and pelvis showed a 2 mm nonobstructing renal calculus and stable fat-containing umbilical hernia. EKG with LBBB similar to prior tracing with no acute ischemic changes.  Given her chest pressure symptoms, HsT obtained which were found to be elevated 106>193>309.  D-dimer found to be 2.09.  She was given ASA 324 mg.  Subsequent CT angiogram of chest with no evidence of acute PE however did show widespread multifocal areas of mixed groundglass and consolidative opacities, findings concerning for CHF with pulmonary edema, also with three-vessel coronary artery calcifications with left ventricular calcification likely reflecting sequela of prior infarct. Covid testing is pending.  She was given Lasix IV 40 mg, IV Pepcid 20 mg, morphine, Zofran and was hydrated with 1 L IVF.  Heparin infusion was initiated given elevated HsT with plans to transfer to [facility] for possible further cardiac evaluation.  Echocardiogram this admission pending however recent echocardiogram 11/05/2019 with LVEF at 60 to 65% with borderline LVH, G1 DD, no regional wall motion abnormalities, annular calcification, and right atrial pressure of 3 mmHg.  Patient recently hospitalized 11/04/2019-11/06/2019 for acute kidney injury after presenting from home with slurred speech and generalized weakness which resolved at ED arrival.  She was found to be mildly hypotensive and was resuscitated with IVF fluid hydration.  Baseline creatinine was 0.9 however during  the hospital admission found to be 2.29.  MRI is found to be negative for acute stroke.  Echocardiogram performed at that time as above.  Past Medical History:  Diagnosis Date  . Allergic rhinitis   . Ambulates with cane    straight  . Anemia   . Anxiety   . Asthma    seasonal  . Bipolar affective disorder (HCC)    takes Synthroid meds for Bipolar  . CAD (coronary artery disease) 07/2016   by CT scan  . Carpal tunnel syndrome    had surgery but occasional still has some issues per patient  . Cataract   . Centrilobular emphysema (HCC) 07/2016   by CT scan - pt not aware of this  . Constipation due to pain medication   . Depression with anxiety   . Diabetes mellitus    type 2 - no meds diet controlled  . GERD (gastroesophageal reflux disease)   . History of blood transfusion   . 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 Hyperlipidemia   . Hypertension   . OSA (obstructive sleep apnea)    no longer using cpap, uses a bed that raises and lowers hob  . Osteoarthritis   . Osteopenia 08/11/2019   DEXA 07/2019 - T -1.1 R femur (osteopenia)  . Pneumonia   . Restless legs   . Septic arthritis (HCC) 10/11/2012  . Shingles 06/30/2016  . Status post revision of total hip replacement bilateral   prosthetic infection R 2013, L 2015  . Thoracic aortic atherosclerosis (HCC) 11/207   by CT    Past Surgical History:  Procedure Laterality Date  . BREAST CYST ASPIRATION    . CARPAL TUNNEL RELEASE Right 05/15/2011  . CARPAL TUNNEL RELEASE Left 12/24/2017   Procedure: LEFT CARPAL TUNNEL RELEASE;  Surgeon: [name], MD;  Location: [facility];  Service: Orthopedics;  Laterality: Left;  . CERVICAL FUSION  2012   C2/3/4  . COLONOSCOPY WITH PROPOFOL N/A 12/31/2015   diverticulosis, int hem, o/w normal rpt 10 yrs (Rein)  . EYE SURGERY Bilateral    cataract surgery with lens implant  . FOOT SURGERY  1982   bone spur  . I & D EXTREMITY   09/06/2012   Budd Palmer, MD; Right;  I&D of right thigh  . I & D EXTREMITY Left 07/2013   wound vac - daily doxycycline indefinitely  . KNEE ARTHROSCOPY  09/01/2012   Dannielle Huh, MD;  Right  . LUMBAR FUSION  01/08/2012   L3-4  . LUMBAR FUSION  02/2019   unexpectedly discovered MRSA infection - pus Lovell Sheehan)   . LUMBAR LAMINECTOMY/DECOMPRESSION MICRODISCECTOMY N/A 11/13/2016   LAMINOTOMY/LAMINECTOMY LUMBAR FOUR LUMBAR FIVE  WITH RESECTION OF SYNOVIAL CYST;  Surgeon: Tressie Stalker, MD  . NECK SURGERY     Herniated disk C2,3,4  . NOSE SURGERY    . PARTIAL HYSTERECTOMY  1984   for mennorhagia, ovaries remain  . REVISION TOTAL HIP ARTHROPLASTY Left 08/28/2011  . TMJ ARTHROPLASTY  1982  . TOTAL HIP ARTHROPLASTY Left 2002  . TRIGGER FINGER RELEASE Right 05/15/2011   long finger     Prior to Admission medications  Medication Sig Start Date End Date Taking? Authorizing Provider  albuterol (PROVENTIL HFA;VENTOLIN HFA) 108 (90 Base) MCG/ACT inhaler Inhale 2 puffs into the lungs every 6 (six) hours as needed for wheezing or shortness of breath. 12/14/18  Yes [name], MD  aspirin 81 MG EC tablet Take 81 mg by mouth at bedtime.    Yes [provider]  budesonide-formoterol (SYMBICORT) 160-4.5 MCG/ACT inhaler Inhale 2 puffs into the lungs 2 (two) times daily. 11/07/19  Yes [name], MD  celecoxib (CELEBREX) 200 MG capsule TAKE ONE CAPSULE BY MOUTH DAILY Patient taking differently: Take 200 mg by mouth daily.  06/14/19  Yes [name], MD  docusate sodium (COLACE) 100 MG capsule Take 1 capsule (100 mg total) by mouth 2 (two) times daily. 11/06/19  Yes Myrtie Neither, MD  doxycycline (VIBRA-TABS) 100 MG tablet Take 100 mg by mouth 2 (two) times daily. 07/31/19  Yes [provider]  FLUoxetine (PROZAC) 40 MG capsule Take by mouth daily. 03/08/19  Yes [provider]  lamoTRIgine (LAMICTAL) 200 MG tablet Take 200 mg by mouth daily.     Yes [provider]  levothyroxine (SYNTHROID) 150 MCG tablet Take 1 tablet (150 mcg total) by mouth at bedtime. 01/21/19  Yes [name], MD  methocarbamol (ROBAXIN-750) 750 MG tablet Take 1 tablet (750 mg total) by mouth every 6 (six) hours as needed for muscle spasms. 03/25/19  Yes Bergman, Meghan D, NP  montelukast (SINGULAIR) 10 MG tablet TAKE 1 TABLET BY MOUTH EVERYDAY AT BEDTIME Patient taking differently: Take 10 mg by mouth at bedtime.  02/28/19  Yes [name], MD  Multiple Vitamin (MULTIVITAMIN WITH MINERALS) TABS tablet Take 2 tablets by mouth daily.    Yes [provider]  nystatin (MYCOSTATIN/NYSTOP) powder Apply topically 4 (four) times daily. Patient taking differently: Apply 1 application topically 4 (four) times daily as needed (rash).  02/22/19  Yes Reva Bores, MD  ondansetron (ZOFRAN ODT) 8 MG disintegrating tablet Take 1 tablet (8 mg total) by mouth 2 (two) times daily as needed for nausea or vomiting. 11/02/19  Yes [name], MD  Oxcarbazepine (TRILEPTAL) 300 MG tablet Take 300-600 mg by mouth See admin instructions. Take 300 mg by mouth in the morning and 600 mg at night   Yes [provider]  oxyCODONE 10 MG TABS Take 1 tablet (10 mg total) by mouth every 4 (four) hours as needed for severe pain ((score 7 to 10)). 03/25/19  Yes Val Eagle D, NP  pantoprazole (PROTONIX) 40 MG tablet Take 1 tablet (40 mg total) by mouth daily. Patient taking differently: Take 40 mg by mouth 2 (two) times daily.  01/21/19  Yes [name], MD  simvastatin (ZOCOR) 40 MG tablet Take 40 mg by mouth every evening.   Yes [provider]  sucralfate (CARAFATE) 1 g tablet Take 1 tablet (1 g total) by mouth 4 (four) times daily -  with meals and at bedtime. 11/03/19  Yes [name], MD  traMADol (ULTRAM) 50 MG tablet Take 50-100 mg by mouth every 6 (six) hours as needed for moderate pain.  04/27/19  Yes [provider]  Cholecalciferol  (VITAMIN D3) 25 MCG (1000 UT) CAPS Take 1 capsule (1,000 Units total) by mouth daily. Patient not taking: Reported on 11/04/2019 08/11/19   [name], MD  oxybutynin (DITROPAN-XL) 10 MG 24 hr tablet Take 1 tablet (10 mg total) by mouth daily. 11/03/19   Jerilee Field, MD  promethazine (PHENERGAN) 25 MG  suppository Place 1 suppository (25 mg total) rectally every 6 (six) hours as needed for nausea or vomiting. 11/08/19   [name], MD    Inpatient Medications: Scheduled Meds: . carvedilol  6.25 mg Oral BID  . folic acid  1 mg Oral Daily  . insulin aspart  0-9 Units Subcutaneous Q4H  . levothyroxine  150 mcg Oral QHS  . mometasone-formoterol  2 puff Inhalation BID  . montelukast  10 mg Oral QHS  . multivitamin with minerals  1 tablet Oral Daily  . simvastatin  40 mg Oral QPM  . sodium chloride flush  3 mL Intravenous Q12H  . thiamine  100 mg Oral Daily   Or  . thiamine  100 mg Intravenous Daily   Continuous Infusions: . sodium chloride    . heparin 1,400 Units/hr (11/09/19 0910)   PRN Meds: sodium chloride, albuterol, LORazepam **OR** LORazepam, morphine injection, prochlorperazine, sodium chloride flush  Allergies:    Allergies  Allergen Reactions  . Cephalexin Hives  . Hydrocodone Other (See Comments)    Reaction:  Hallucinations   . Tolterodine Tartrate Nausea And Vomiting  . Risperidone And Related Other (See Comments)    Reaction:  Made pt excessively sleepy  . Seroquel [Quetiapine Fumarate] Other (See Comments)    Reaction:  Made pt excessively sleepy  . Sulfa Antibiotics Rash    Social History:   Social History   Socioeconomic History  . Marital status: Married    Spouse name: Not on file  . Number of children: 3  . Years of education: Not on file  . Highest education level: Not on file  Occupational History  . Occupation: Designer, industrial/product: UNEMPLOYED  Tobacco Use  . Smoking status: Former Smoker    Packs/day: 1.50    Years: 40.00     Pack years: 60.00    Types: Cigarettes    Quit date: 04/29/2009    Years since quitting: 10.5  . Smokeless tobacco: Never Used  Substance and Sexual Activity  . Alcohol use: Yes    Alcohol/week: 21.0 standard drinks    Types: 21 Shots of liquor per week    Comment: 3 shots/per night  . Drug use: No  . Sexual activity: Never    Comment: Hysterectomy  Other Topics Concern  . 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 Difficulty of Paying Living Expenses: Not on file  Food Insecurity:   . Worried About Programme researcher, broadcasting/film/video in the Last Year: Not on file  . Ran Out of Food in the Last Year: Not on file  Transportation Needs:   . Lack of Transportation (Medical): Not on file  . Lack of Transportation (Non-Medical): Not on file  Physical Activity:   . Days of Exercise per Week: Not on file  . Minutes of Exercise per Session: Not on file  Stress:   . Feeling of Stress : Not on file  Social Connections:   . Frequency of Communication with Friends and Family: Not on file  . Frequency of Social Gatherings with Friends and Family: Not on file  . Attends Religious Services: Not on file  . Active Member of Clubs or Organizations: Not on file  .  Attends Banker Meetings: Not on file  . Marital Status: Not on file  Intimate Partner Violence:   . Fear of Current or Ex-Partner: Not on file  . Emotionally Abused: Not on file  . Physically Abused: Not on file  . Sexually Abused: Not on file    Family History:   Family History  Problem Relation Age of Onset  . Aneurysm Father 62       brain  . Alcohol abuse Father   . Cancer Father        possibly  . CAD Other        several siblings  . Cancer Brother        prostate  . Diabetes Brother    . Diabetes Sister   . Anesthesia problems Neg Hx   . Hypotension Neg Hx   . Malignant hyperthermia Neg Hx   . Pseudochol deficiency Neg Hx   . Breast cancer Neg Hx    Family Status:  Family Status  Relation Name Status  . Father  Deceased at age 31  . Mother  Deceased at age 59       MVA  . Other  (Not Specified)  . Brother x2 (Not Specified)  . Brother  Deceased  . Sister  Deceased  . Neg Hx  (Not Specified)    ROS:  Please see the history of present illness.  All other ROS reviewed and negative.     Physical Exam/Data:   Vitals:   11/09/19 0600 11/09/19 0653 11/09/19 0800 11/09/19 0930  BP: (!) 154/83 (!) 150/78 (!) 149/88 (!) 164/92  Pulse: 95 97 86 91  Resp: 18 20 18 20   Temp:      TempSrc:      SpO2: 100% 100% 100% 100%  Weight:      Height:        Intake/Output Summary (Last 24 hours) at 11/09/2019 1019 Last data filed at 11/09/2019 0910 Gross per 24 hour  Intake 1117.55 ml  Output --  Net 1117.55 ml   Filed Weights   11/08/19 1829  Weight: (!) 166.8 kg   Body mass index is 63.13 kg/m.   General: Morbid obesity, NAD Skin: Warm, dry, intact  Head: Normocephalic, atraumatic, clear, moist mucus membranes. Neck: Negative for carotid bruits. No JVD Lungs: Diminished in bilateral lobes secondary to body habitus. Breathing is unlabored. Cardiovascular: RRR with S1 S2. No murmurs Abdomen: Soft, non-tender, distended. No obvious abdominal masses. Extremities: No edema. DP pulses 1+ bilaterally Neuro: Alert and oriented. No focal deficits. No facial asymmetry. MAE spontaneously. Psych: Responds to questions appropriately with normal affect.     EKG:  The EKG was personally reviewed and demonstrates: 11/09/2019 NSR, HR 96 bpm, LBBB, TWI in the inferior and lateral leads.  New lateral lead inversion when compared to EKG from 03/18/2019.  QTc, 03/20/2019  Telemetry:  Telemetry was personally reviewed and demonstrates: 11/09/2019 NSR, HR 80s to 90s  Relevant CV  Studies:  Echocardiogram: Pending   Echocardiogram 11/05/19:  1. Left ventricular ejection fraction, by visual estimation, is 60 to  65%. The left ventricle has normal function. There is borderline left  ventricular hypertrophy.  2. Definity contrast agent was given IV to delineate the left ventricular  endocardial borders.  3. Elevated left ventricular end-diastolic pressure.  4. Left ventricular diastolic parameters are consistent with Grade I  diastolic dysfunction (impaired relaxation).  5. The left ventricle has no regional wall motion abnormalities.  6. Global right ventricle  has normal systolic function.The right  ventricular size is mildly enlarged. No increase in right ventricular wall  thickness.  7. Left atrial size was normal.  8. Right atrial size was normal.  9. Severe mitral annular calcification.  10. The mitral valve is degenerative. Trivial mitral valve regurgitation.  11. The tricuspid valve is grossly normal.  12. The tricuspid valve is grossly normal. Tricuspid valve regurgitation  is trivial.  13. The aortic valve is tricuspid. Aortic valve regurgitation is not  visualized. No evidence of aortic valve sclerosis or stenosis.  14. The pulmonic valve was grossly normal. Pulmonic valve regurgitation is  trivial.  15. The inferior vena cava is normal in size with greater than 50%  respiratory variability, suggesting right atrial pressure of 3 mmHg.    Laboratory Data:  Chemistry Recent Labs  Lab 11/05/19 0510 11/06/19 1013 11/08/19 2057  NA 126* 135 138  K 3.3* 4.1 4.2  CL 93* 102 100  CO2 22 25 26   GLUCOSE 145* 172* 217*  BUN 39* 28* 11  CREATININE 2.26* 0.94 0.56  CALCIUM 8.4* 8.8* 9.0  GFRNONAA 22* >60 >60  GFRAA 25* >60 >60  ANIONGAP 11 8 12     Total Protein  Date Value Ref Range Status  11/08/2019 7.4 6.5 - 8.1 g/dL Final  16/06/9603 7.0 6.0 - 8.5 g/dL Final  54/05/8118 9.1 (H) 6.4 - 8.2 g/dL Final   Albumin  Date Value Ref  Range Status  11/08/2019 4.2 3.5 - 5.0 g/dL Final  14/78/2956 4.7 3.6 - 4.8 g/dL Final  21/30/8657 3.3 (L) 3.4 - 5.0 g/dL Final   AST  Date Value Ref Range Status  11/08/2019 28 15 - 41 U/L Final  06/18/2015 19 U/L Final   ALT  Date Value Ref Range Status  11/08/2019 32 0 - 44 U/L Final  06/18/2015 21  Final   Alkaline Phosphatase  Date Value Ref Range Status  11/08/2019 83 38 - 126 U/L Final  06/18/2015 78 U/L Final   Total Bilirubin  Date Value Ref Range Status  11/08/2019 0.6 0.3 - 1.2 mg/dL Final  84/69/6295 0.3 mg/dL Final   Bilirubin Total  Date Value Ref Range Status  09/14/2018 0.4 0.0 - 1.2 mg/dL Final   Hematology Recent Labs  Lab 11/04/19 1535 11/05/19 0510 11/08/19 2057  WBC 11.0* 8.7 8.5  RBC 4.82 4.61 4.97  HGB 13.7 13.1 14.1  HCT 40.9 40.0 43.0  MCV 84.9 86.8 86.5  MCH 28.4 28.4 28.4  MCHC 33.5 32.8 32.8  RDW 15.6* 15.7* 16.0*  PLT 255 205 288   Cardiac EnzymesNo results for input(s): TROPONINI in the last 168 hours. No results for input(s): TROPIPOC in the last 168 hours.  BNP Recent Labs  Lab 11/09/19 0504  BNP 565.6*    DDimer  Recent Labs  Lab 11/08/19 2307  DDIMER 2.09*   TSH:  Lab Results  Component Value Date   TSH 1.050 11/09/2019   Lipids: Lab Results  Component Value Date   CHOL 139 11/09/2019   HDL 58 11/09/2019   LDLCALC 73 11/09/2019   LDLDIRECT 48.0 03/13/2016   TRIG 41 11/09/2019   CHOLHDL 2.4 11/09/2019   HgbA1c: Lab Results  Component Value Date   HGBA1C 7.3 (H) 08/08/2019    Radiology/Studies:  CT ABDOMEN PELVIS WO CONTRAST  Result Date: 11/08/2019 CLINICAL DATA:  Nausea, vomiting, abdominal pain for 2 weeks EXAM: CT ABDOMEN AND PELVIS WITHOUT CONTRAST TECHNIQUE: Multidetector CT imaging of the abdomen and pelvis was performed  following the standard protocol without IV contrast. COMPARISON:  10/31/2019 FINDINGS: Lower chest: Left ventricular calcifications are unchanged. No pericardial effusion.  Bibasilar interlobular septal thickening and vascular prominence consistent with interstitial edema. Hepatobiliary: No focal liver abnormality is seen. No gallstones, gallbladder wall thickening, or biliary dilatation. Pancreas: Unremarkable. No pancreatic ductal dilatation or surrounding inflammatory changes. Spleen: Normal in size without focal abnormality. Adrenals/Urinary Tract: There is a punctate less than 2 mm nonobstructing calculus right kidney, reference image 44 series 2. No obstructive uropathy. The left kidney is unremarkable. Bladder is grossly normal. The adrenals are unremarkable. Stomach/Bowel: No bowel obstruction or ileus. Normal appendix right lower quadrant. Vascular/Lymphatic: Mild calcified plaque within the distal aorta at the bifurcation. No pathologic adenopathy within the abdomen or pelvis. Reproductive: Status post hysterectomy. No adnexal masses. Other: Small fat containing umbilical hernia unchanged. No free fluid or free gas within the abdomen or pelvis. Musculoskeletal: Left hip arthroplasty is identified. Postsurgical changes lower lumbar spine. No acute or destructive bony lesions. IMPRESSION: 1. Punctate 2 mm nonobstructing right renal calculus. 2. Bibasilar interstitial edema. 3. Stable fat containing umbilical hernia. Electronically Signed   By: Sharlet Salina M.D.   On: 11/08/2019 21:22   CT Angio Chest PE W and/or Wo Contrast  Result Date: 11/09/2019 CLINICAL DATA:  Shortness of breath, elevated troponin and D-dimer EXAM: CT ANGIOGRAPHY CHEST WITH CONTRAST TECHNIQUE: Multidetector CT imaging of the chest was performed using the standard protocol during bolus administration of intravenous contrast. Multiplanar CT image reconstructions and MIPs were obtained to evaluate the vascular anatomy. CONTRAST:  OMNIPAQUE IOHEXOL 350 MG/ML SOLN COMPARISON:  CT chest 08/29/2019 FINDINGS: Cardiovascular: Satisfactory opacification the pulmonary arteries to the segmental level. No  pulmonary artery filling defects are identified. Central pulmonary arteries are normal caliber. Cardiac size is borderline enlarged. There is dense calcification of mitral annulus. Left ventricular calcification likely reflects sequela of prior infarct. There is lipomatous hypertrophy of the intra-atrial septum. Three-vessel coronary artery calcifications are noted. Atherosclerotic plaque within the normal caliber aorta. Minimal plaque within the normally branching great vessels. Mediastinum/Nodes: Scattered low-attenuation mediastinal and hilar adenopathy with several borderline enlarged lymph nodes including the 12 mm precarinal lymph node (4/40) and a 16 mm subcarinal lymph node (4/50). No mediastinal fluid or gas. No acute abnormality of the trachea or esophagus. Posterior bowing of the trachea likely related to imaging during exhalation for the angiographic technique. Included portions of thyroid gland are unremarkable. Lungs/Pleura: Widespread multifocal areas of mixed ground-glass and consolidative opacity on a background interlobular septal thickening and cephalized vascularity with small bilateral pleural effusions, right greater than left. Atelectatic changes are present in the lungs with some mosaic attenuation which could reflect imaging during exhalation or small airways disease/air trapping. Upper Abdomen: No acute abnormalities present in the visualized portions of the upper abdomen. Musculoskeletal: Multilevel degenerative changes are present in the imaged portions of the spine. No acute osseous abnormality or suspicious osseous lesion. Prior cervical spinal fusion is partially included in the level of imaging without acute hardware complication. Review of the MIP images confirms the above findings. IMPRESSION: 1. No evidence of acute pulmonary embolism. 2. Widespread multifocal areas of mixed ground-glass and consolidative opacity on a background of interlobular septal thickening and cephalized  vascularity with small bilateral pleural effusions, right greater than left. Findings are favored to reflect CHF with pulmonary edema though superimposed infection is not fully excluded. 3. Scattered borderline enlarged mediastinal lymph nodes are likely reactive in etiology. 4. Three-vessel coronary artery calcifications.  Left ventricular calcification likely reflects sequela of prior infarct. 5. Lipomatous hypertrophy of the intra-atrial septum. 6. Aortic Atherosclerosis (ICD10-I70.0). Electronically Signed   By: Kreg Shropshire M.D.   On: 11/09/2019 02:14   ECHOCARDIOGRAM COMPLETE  Result Date: 11/05/2019   ECHOCARDIOGRAM REPORT   Patient Name:   IRIANA ARTLEY Verga Date of Exam: 11/05/2019 Medical Rec #:  [id number]     Height:       64.0 in Accession #:    [id number]    Weight:       358.5 lb Date of Birth:  07/07/1951      BSA:          2.51 m Patient Age:    68 years      BP:           119/46 mmHg Patient Gender: F             HR:           69 bpm. Exam Location:  Inpatient Procedure: 2D Echo, Color Doppler, Cardiac Doppler and Intracardiac            Opacification Agent Indications:    Syncope 780.2 / R55  History:        Patient has no prior history of Echocardiogram examinations.                 CAD, Signs/Symptoms:Dyspnea; Risk Factors:Hypertension,                 Diabetes, Dyslipidemia, Former Smoker and Obesity. Thoracic                 aortic atherosclerosis.  Sonographer:    Leeroy Bock Turrentine Referring Phys: [id number] PARDEEP ZHYQM  Sonographer Comments: Image acquisition challenging due to patient body habitus. IMPRESSIONS  1. Left ventricular ejection fraction, by visual estimation, is 60 to 65%. The left ventricle has normal function. There is borderline left ventricular hypertrophy.  2. Definity contrast agent was given IV to delineate the left ventricular endocardial borders.  3. Elevated left ventricular end-diastolic pressure.  4. Left ventricular diastolic parameters are consistent with Grade I diastolic  dysfunction (impaired relaxation).  5. The left ventricle has no regional wall motion abnormalities.  6. Global right ventricle has normal systolic function.The right ventricular size is mildly enlarged. No increase in right ventricular wall thickness.  7. Left atrial size was normal.  8. Right atrial size was normal.  9. Severe mitral annular calcification. 10. The mitral valve is degenerative. Trivial mitral valve regurgitation. 11. The tricuspid valve is grossly normal. 12. The tricuspid valve is grossly normal. Tricuspid valve regurgitation is trivial. 13. The aortic valve is tricuspid. Aortic valve regurgitation is not visualized. No evidence of aortic valve sclerosis or stenosis. 14. The pulmonic valve was grossly normal. Pulmonic valve regurgitation is trivial. 15. The inferior vena cava is normal in size with greater than 50% respiratory variability, suggesting right atrial pressure of 3 mmHg. FINDINGS  Left Ventricle: Left ventricular ejection fraction, by visual estimation, is 60 to 65%. The left ventricle has normal function. Definity contrast agent was given IV to delineate the left ventricular endocardial borders. The left ventricle has no regional wall motion abnormalities. The left ventricular internal cavity size was the left ventricle is normal in size. There is borderline left ventricular hypertrophy. Left ventricular diastolic parameters are consistent with Grade I diastolic dysfunction (impaired relaxation). Elevated left ventricular end-diastolic pressure. Right Ventricle: The right ventricular size is mildly enlarged. No increase in right ventricular  wall thickness. Global RV systolic function is has normal systolic function. Left Atrium: Left atrial size was normal in size. Right Atrium: Right atrial size was normal in size Pericardium: There is no evidence of pericardial effusion. Mitral Valve: The mitral valve is degenerative in appearance. Severe mitral annular calcification. Trivial mitral  valve regurgitation. Tricuspid Valve: The tricuspid valve is grossly normal. Tricuspid valve regurgitation is trivial. Aortic Valve: The aortic valve is tricuspid. Aortic valve regurgitation is not visualized. The aortic valve is structurally normal, with no evidence of sclerosis or stenosis. Mild aortic valve annular calcification. Aortic valve mean gradient measures 8.0 mmHg. Aortic valve peak gradient measures 15.7 mmHg. Aortic valve area, by VTI measures 2.24 cm. Pulmonic Valve: The pulmonic valve was grossly normal. Pulmonic valve regurgitation is trivial. Pulmonic regurgitation is trivial. Aorta: The aortic root is normal in size and structure. Venous: The inferior vena cava is normal in size with greater than 50% respiratory variability, suggesting right atrial pressure of 3 mmHg. IAS/Shunts: No atrial level shunt detected by color flow Doppler.  LEFT VENTRICLE PLAX 2D LVIDd:         5.50 cm  Diastology LVIDs:         3.40 cm  LV e' medial:   7.40 cm/s LV PW:         1.00 cm  LV E/e' medial: 19.1 LV IVS:        1.00 cm LVOT diam:     2.00 cm LV SV:         100 ml LV SV Index:   35.43 LVOT Area:     3.14 cm  RIGHT VENTRICLE RV S prime:     15.30 cm/s LEFT ATRIUM             Index       RIGHT ATRIUM           Index LA diam:        5.20 cm 2.07 cm/m  RA Area:     17.10 cm LA Vol (A2C):   66.6 ml 26.57 ml/m RA Volume:   43.90 ml  17.51 ml/m LA Vol (A4C):   82.1 ml 32.75 ml/m LA Biplane Vol: 75.3 ml 30.04 ml/m  AORTIC VALVE AV Area (Vmax):    2.62 cm AV Area (Vmean):   2.58 cm AV Area (VTI):     2.24 cm AV Vmax:           198.00 cm/s AV Vmean:          123.000 cm/s AV VTI:            0.349 m AV Peak Grad:      15.7 mmHg AV Mean Grad:      8.0 mmHg LVOT Vmax:         165.00 cm/s LVOT Vmean:        101.000 cm/s LVOT VTI:          0.249 m LVOT/AV VTI ratio: 0.71  AORTA Ao Root diam: 2.90 cm MITRAL VALVE MV Area (PHT): 2.74 cm              SHUNTS MV PHT:        80.33 msec            Systemic VTI:  0.25 m  MV Decel Time: 277 msec              Systemic Diam: 2.00 cm MV E velocity: 141.00 cm/s 103 cm/s MV A velocity: 134.00 cm/s  70.3 cm/s MV E/A ratio:  1.05        1.5  Prentice Docker MD Electronically signed by Prentice Docker MD Signature Date/Time: 11/05/2019/12:41:25 PM    Final    Assessment and Plan:   1. NSTEMI: -Patient presented with abdominal pain, nausea vomiting for the last several weeks (was previously hospitalized and discharged 05/05/2020).  Initially her symptoms improved for several days however returned on day of presentation.  During ED work-up, after returning from abdominal CT patient reported midsternal chest pressure with associated shortness of breath.  EKG performed with no significant acute changes from prior tracing. She does have LBBB and TWI's inferior lateral leads when compared to EKG from 03/18/2019. HsT found to be 193, 309, 318.  Chest CTA performed in the setting of elevated D-dimer which showed no acute PE with widespread multifocal areas of mixed groundglass and consolidative opacities consistent with CHF with pulmonary edema also noted to have three-vessel coronary artery calcifications and left ventricular calcification reflecting sequela of prior infarct.  Patient was given ASA 324 mg and heparin infusion was initiated. -Patient has many CRF's including morbid obesity, DM 2, hypertension, hyperlipidemia, previous tobacco use and strong family history of CAD in her mother, sister and brother with previous MI and CABG.  -Would consider further evaluation with coronary CT versus cardiac catheterization to fully assess coronary anatomy at this time given risk factors as described above.  As above, chest CTA with three-vessel coronary artery disease therefore proceeding with cardiac catheterization may be the most definitive option at this time.  Given patient's body habitus, not a candidate for coronary CTA -Prior echocardiogram with normal LV function, G1 DD and no regional  wall motion abnormalities performed 11/05/2019 during recent hospitalization.  Repeat echocardiogram with pending results at this time -Elevated BNP on ED arrival with evidence of CHF on CTA therefore patient was given IV Lasix 40 mg -Continue ASA, statin -Add low-dose beta-blocker  2.  Abdominal pain, emesis: -Patient reports a several week history of abdominal pain located in the epigastric and periumbilical regions with associated nausea and vomiting. Abdominal CT with no acute abnormality.  Lab workup essentially normal with no specific etiology for symptoms -Continue management per primary team -Questionable anginal equivalent  3.  Acute hypoxic respiratory failure secondary to pulmonary edema: -Patient presented with abdominal pain however found to have an O2 saturation at 88% on room air on the ED presentation improved with 4 L supplemental oxygen.  CTA was negative for acute PE however did have widespread multifocal areas of mixed groundglass and consolidative opacities consistent with CHF and pulmonary edema.  -Has history of asthma on home therapy -Patient given 1 dose IV Lasix 40 mg in the ED -Weight, 367lb  -I&O, net +1050 -Stable O2 saturations at this time -Echocardiogram pending  -Prior echocardiogram from 11/05/2019 with normal LV function and G1 DD and no regional wall motion abnormalities  4.  Prolonged QTC: -QTC on EKG found to be -Zofran discontinued>> has been taking at home secondary to nausea and vomiting -Keep K+ greater than 4.0 and magnesium greater than 2.0  5.  HTN: -Elevated, 149/88, 150/78, 154/83 -Add beta-blocker to therapy given coronary RF's  -Likely will need to add ACE/ARB   6.  Hyperlipidemia: -On home Zocor -LDL 11/09/2019, 73  7.  DM2: -Reported as diet controlled however hemoglobin A1c found to be elevated at 7.3 08/08/2019 -Repeat Hemoglobin A1c this admission -SSI for glucose control inpatient status   For questions or updates,  please  contact [place] Please consult [email or web address] for contact info under Cardiology/STEMI.   Lyndel Safe NP-C [facility] Pager: [phone] 11/09/2019 10:19 AM   History and all data above reviewed.  Patient examined.   The patient has no past cardiac history but she does have significant risk factors.  She has had now her second presentation with abdominal complaints predominantly.  She had dehydration and was treated for urinary infection recently.  This time she came in with acute nausea and throwing up.  She said this happened at rest.  Sounds like she is very sedentary at baseline.  She is morbidly obese and has lots of back and joint issues.  She ambulates with canes and it does not sound like she can go very far but she does in the house.  She does get short of breath because of pain if she tries to ambulate very far.  She does not bring on chest pressure, neck or arm discomfort.  She cannot really associate nausea and vomiting that she has had with activity.  Seems to come out of the blue.  However, she does have chronic abnormal EKG with left bundle branch block.  She is never had any prior cardiac work-up that she can report.  She this visit is found to have elevated troponin and slightly increasing and is noted to have coronary calcium on a CT to rule out pulmonary embolism.  She is not having any chest pressure, neck or arm discomfort.  She is not describing PND or orthopnea.  She is not had any new palpitations, presyncope or syncope.  I agree with the findings as above.  The patient exam reveals COR:RRR  ,  Lungs: Clear  ,  Abd: Positive bowel sounds, no rebound no guarding, Ext No edema  .  All available labs, radiology testing, previous records reviewed. Agree with documented assessment and plan.   NAUSEA/VOMITING: This certainly could be an anginal equivalent.  She has obvious coronary calcification.  She has an abnormal EKG which is not acute but uninterpretable for acute ST  changes.  She has significant cardiovascular risk factors.  She has an elevated troponin with a delta upwards.  Given all of this the pretest probability of obstructive coronary disease as the etiology of the least some of her complaints is at least moderately high.  She not a candidate for noninvasive imaging given her size.  I think cardiac catheterization is indicated.  I discussed this with patient and her daughter. The patient understands that risks included but are not limited to stroke (1 in 1000), death (1 in 78), kidney failure [usually temporary] (1 in 500), bleeding (1 in 200), allergic reaction [possibly serious] (1 in 200).  The patient understands and agrees to proceed.   They would agree to proceed.  We will transfer [facility] to [facility] with the plans for cardiac catheterization tomorrow.   Minus Breeding  10:53 AM  11/09/2019

## 2019-11-09 NOTE — ED Notes (Signed)
Ice chips given to patient.

## 2019-11-09 NOTE — Progress Notes (Signed)
ANTICOAGULATION CONSULT NOTE - Initial Consult  Pharmacy Consult for Heparin Indication: chest pain/ACS  Allergies  Allergen Reactions  . Cephalexin Hives  . Hydrocodone Other (See Comments)    Reaction:  Hallucinations   . Tolterodine Tartrate Nausea And Vomiting  . Risperidone And Related Other (See Comments)    Reaction:  Made pt excessively sleepy  . Seroquel [Quetiapine Fumarate] Other (See Comments)    Reaction:  Made pt excessively sleepy  . Sulfa Antibiotics Rash    Patient Measurements: Height: 5\' 4"  (162.6 cm) Weight: (!) 367 lb 1.1 oz (166.5 kg) IBW/kg (Calculated) : 54.7 Heparin Dosing Weight: 98 kg  Vital Signs: Temp: 99.8 F (37.7 C) (02/10 2038) Temp Source: Oral (02/10 2038) BP: 109/74 (02/10 1950) Pulse Rate: 86 (02/10 2038)  Labs: Recent Labs    11/08/19 2057 11/08/19 2309 11/09/19 0230 11/09/19 0341 11/09/19 1058 11/09/19 1250 11/09/19 2013  HGB 14.1  --   --   --   --   --   --   HCT 43.0  --   --   --   --   --   --   PLT 288  --   --   --   --   --   --   APTT  --   --   --  27  --   --   --   LABPROT  --   --   --  12.8  --   --   --   INR  --   --   --  1.0  --   --   --   HEPARINUNFRC  --   --   --   --   --  0.68 0.11*  CREATININE 0.56  --   --   --   --   --   --   TROPONINIHS 106*   < > 309* 318* 393*  --   --    < > = values in this interval not displayed.    Estimated Creatinine Clearance: 107.1 mL/min (by C-G formula based on SCr of 0.56 mg/dL).   Medications:  Infusions:  . sodium chloride    . sodium chloride    . [START ON 11/10/2019] sodium chloride     Followed by  . [START ON 11/10/2019] sodium chloride    . heparin 1,400 Units/hr (11/09/19 1223)    Assessment: 78 yoF with PMH anemia, asthma, emphysema, bipolar, CAD, DM2, HTN, HLD, hypothyroid, admitted for suspected NSTEMI. Pharmacy to dose IV heparin   Baseline INR, aPTT: WNL  Prior anticoagulation: none  Significant events:  HL subtherapeutic at 0.11 on  transfer,   Goal of Therapy: Heparin level 0.3-0.7 units/ml Monitor platelets by anticoagulation protocol: Yes  Plan:  Heparin 3000 units IV x 1  Increase heparin IV infusion to 1700 units/hr  Recheck  heparin in 6-8 hr  Daily CBC, daily heparin level once stable  Monitor for signs of bleeding or thrombosis  Maryam Feely A. 80, PharmD, BCPS, FNKF Clinical Pharmacist [place] Please utilize Amion for appropriate phone number to reach the unit pharmacist [facility])   11/09/2019, 9:00 PM

## 2019-11-09 NOTE — ED Notes (Signed)
Assisted patient onto the bedpan. Will call out when finished.

## 2019-11-09 NOTE — ED Notes (Addendum)
[facility] called for transport. Paper's at nurses station. Attempted to call report, RN was not available.

## 2019-11-09 NOTE — ED Notes (Signed)
Purewick applied.

## 2019-11-09 NOTE — Progress Notes (Signed)
Patient seen and examined, admitted this morning by Dr. Loney Loh.    Briefly, 69 year old female with history of anemia, asthma, emphysema, bipolar disorder, CAD, diabetes mellitus, hypertension, hyperlipidemia, hypothyroidism presented with intermittent episodes of abdominal pain and emesis for several weeks.  Patient reported epigastric and periumbilical pain, while in ED started having acute onset substernal nonradiating chest pressure associated with dyspnea.  Symptoms resolved after 20 minutes. Troponins elevated 309, D-dimer elevated, CT angiogram showed no PE, widespread multifocal areas of mixed groundglass and consolidative opacities on a background of interlobular septal thickening and cephalized vascularity with small bilateral pleural effusions right greater than left. COVID-19 test pending  On exam,  Alert and oriented x3 Lungs : diminished breath sounds in bilateral lobes, CVS: RRR, S1-S2 clear Abdomen: No significant tenderness, soft, NBS Extremities: No edema  NSTEMI -Appreciate cardiology recommendations, awaiting transfer to [facility] for cardiac catheterization.  Obtain serial cardiac enzymes, continue IV heparin -Repeat 2D echo  Acute hypoxic respiratory failure, acute cardiogenic pulmonary edema -COVID-19 test pending.  Continue O2 supplementation, follow 2D echo -Received 1 dose of Lasix IV in ED.  Abdominal pain, emesis CT abdomen showed no acute abnormality, ?  Anginal equivalent   Amy Barnes M.D. [facility] 11/09/2019, 9:36 AM

## 2019-11-09 NOTE — ED Notes (Signed)
Admitting provider at bedside.

## 2019-11-09 NOTE — ED Provider Notes (Signed)
Patient taken in second signout from NP [facility].  Patient here with recurrent nausea and vomiting.  She had a CT scan of her abdomen, work-up showed no significant abnormality.  Vomiting improved and able to hold down ice chips at this time however patient developed onset of severe chest pain and shortness of breath.  Elevated troponin and D-dimer level.  Will obtain CT angiogram.   Patient with progressively elevating troponins.  I personally viewed the patient's CT angiogram which shows pulmonary edema.  Patient had an echocardiogram about 6 days ago that was normal.  In the setting of diabetic female with persistent nausea, upwardly trending troponins, new onset pulmonary edema have concern for underlying NSTEMI/ACS with new onset heart failure.   On reevaluation of the patient patient appears very ill.  She is tachypneic.  She has crackles in the lung bases.  Patient now requiring 4 L of oxygen to maintain oxygen saturations.  She denies chest pain but has continuous belching and gagging.  Given repeat dose of Zofran.    Patient given IV Lasix, started on heparin.  Consulted with cardiology will round on the patient.  Should be admitted to the hospitalist service.  .Critical Care Performed by: Arthor Captain, PA-C Authorized by: Arthor Captain, PA-C   Critical care provider statement:    Critical care time (minutes):  50   Critical care time was exclusive of:  Separately billable procedures and treating other patients   Critical care was necessary to treat or prevent imminent or life-threatening deterioration of the following conditions:  Cardiac failure and respiratory failure   Critical care was time spent personally by me on the following activities:  Discussions with consultants, evaluation of patient's response to treatment, examination of patient, ordering and performing treatments and interventions, ordering and review of laboratory studies, ordering and review of radiographic studies,  pulse oximetry, re-evaluation of patient's condition, obtaining history from patient or surrogate and review of old charts      Arthor Captain, PA-C 11/09/19 0720    Dione Booze, MD 11/09/19 [phone]

## 2019-11-09 NOTE — Progress Notes (Signed)
ANTICOAGULATION CONSULT NOTE - Initial Consult  Pharmacy Consult for Heparin Indication: chest pain/ACS  Allergies  Allergen Reactions  . Cephalexin Hives  . Hydrocodone Other (See Comments)    Reaction:  Hallucinations   . Tolterodine Tartrate Nausea And Vomiting  . Risperidone And Related Other (See Comments)    Reaction:  Made pt excessively sleepy  . Seroquel [Quetiapine Fumarate] Other (See Comments)    Reaction:  Made pt excessively sleepy  . Sulfa Antibiotics Rash    Patient Measurements: Height: 5\' 4"  (162.6 cm) Weight: (!) 367 lb 12.8 oz (166.8 kg) IBW/kg (Calculated) : 54.7 Heparin Dosing Weight: 98 kg  Vital Signs: BP: 170/97 (02/10 1230) Pulse Rate: 91 (02/10 1230)  Labs: Recent Labs    11/08/19 2057 11/08/19 2309 11/09/19 0230 11/09/19 0341 11/09/19 1058 11/09/19 1250  HGB 14.1  --   --   --   --   --   HCT 43.0  --   --   --   --   --   PLT 288  --   --   --   --   --   APTT  --   --   --  27  --   --   LABPROT  --   --   --  12.8  --   --   INR  --   --   --  1.0  --   --   HEPARINUNFRC  --   --   --   --   --  0.68  CREATININE 0.56  --   --   --   --   --   TROPONINIHS 106*   < > 309* 318* 393*  --    < > = values in this interval not displayed.    Estimated Creatinine Clearance: 107.2 mL/min (by C-G formula based on SCr of 0.56 mg/dL).   Medications:  Infusions:  . sodium chloride    . heparin 1,400 Units/hr (11/09/19 1223)    Assessment: 26 yoF with PMH anemia, asthma, emphysema, bipolar, CAD, DM2, HTN, HLD, hypothyroid, admitted for suspected NSTEMI. Pharmacy to dose IV heparin   Baseline INR, aPTT: WNL  Prior anticoagulation: none  Significant events:  Today, 11/09/2019:  CBC: WNL  Most recent heparin level therapeutic on 1400 units/hr  No bleeding or infusion issues per nursing  Goal of Therapy: Heparin level 0.3-0.7 units/ml Monitor platelets by anticoagulation protocol: Yes  Plan:  Continue heparin IV infusion at  1400 units/hr  Recheck confirmatory heparin in 6-8 hr  Daily CBC, daily heparin level once stable  Monitor for signs of bleeding or thrombosis  01/07/2020, PharmD, BCPS [phone] 11/09/2019, 2:18 PM

## 2019-11-09 NOTE — Progress Notes (Signed)
Bilateral lower extremity venous duplex has been completed. Preliminary results can be found in CV Proc through chart review.   11/09/19 9:22 AM Olen Cordial RVT

## 2019-11-09 NOTE — ED Notes (Signed)
Attempted to call report to receiving RN. Staff at [facility] stated the patients assignment has been withdrawn due to pending covid status. Will continue to monitor.

## 2019-11-09 NOTE — H&P (View-Only) (Signed)
Cardiology Consultation:   Patient ID: Amy Barnes; [id number]; [birth year]   Admit date: 11/08/2019 Date of Consult: 11/09/2019  Primary Care Provider: [name], MD Primary Cardiologist: New to [facility]   Patient Profile:   Amy Barnes is a 69 y.o. female with a hx of morbid obesity, anemia, anxiety, asthma, emphysema, depression, bipolar affective disorder, previously diet controlled DM 2 (hemoglobin A1c 7.2 this admission), hypertension, hyperlipidemia and hypothyroidism who is being seen today for the evaluation of NSTEMI at the request of Dr. Loney Barnes.  History of Present Illness:   Amy Barnes is a 69 year old female with a history stated above who presented to [facility] on 11/09/2019 for the evaluation of abdominal pain, nausea and vomiting.  Patient reports that she has had intermittent episodes of the above for several weeks and has been seen and recently hospitalized for similar symptoms.  She was most recently discharged on Sunday with symptom improvement however returned today given worsening abdominal pain and vomiting.  During her ED course, she reported an episode of midsternal chest heaviness with associated dyspnea after returning from CT.  Patient states that her symptoms lasted approximately 20 minutes with this patient on its own.  She states that she is experienced similar symptoms in the past which typically occur with minimal exertion.  She has asthma for which she is prescribed inhalers and reports that during times that she is more short of breath and needing increased frequency of inhaler, she has noticed that the chest heaviness is more prominent.  She has no prior documented history of CAD however has a strong family history in her mother, brother and sister.  Brother and sister who are both older have had bypass surgery and mother with a prior MI who is now deceased.  She has a history of tobacco use for approximately 30 to 40 years however has since quit  for the last 11.  She is morbidly obese with a BMI of 63.  She reports she was previously seen by cardiologist while living in [place] many years ago for hypertension.  She did wear a monitor in the past for palpitations which was found to be normal.  She has had no invasive cardiac work-up.  Strong cardiac risk factors above.  In the ED, patient with found to be mildly hypoxic on presentation with an O2 saturation of 88% on room air which improved with supplemental oxygen.  CT of abdomen and pelvis showed a 2 mm nonobstructing renal calculus and stable fat-containing umbilical hernia. EKG with LBBB similar to prior tracing with no acute ischemic changes.  Given her chest pressure symptoms, HsT obtained which were found to be elevated 106>193>309.  D-dimer found to be 2.09.  She was given ASA 324 mg.  Subsequent CT angiogram of chest with no evidence of acute PE however did show widespread multifocal areas of mixed groundglass and consolidative opacities, findings concerning for CHF with pulmonary edema, also with three-vessel coronary artery calcifications with left ventricular calcification likely reflecting sequela of prior infarct. Covid testing is pending.  She was given Lasix IV 40 mg, IV Pepcid 20 mg, morphine, Zofran and was hydrated with 1 L IVF.  Heparin infusion was initiated given elevated HsT with plans to transfer to [facility] for possible further cardiac evaluation.  Echocardiogram this admission pending however recent echocardiogram 11/05/2019 with LVEF at 60 to 65% with borderline LVH, G1 DD, no regional wall motion abnormalities, annular calcification, and right atrial pressure of 3 mmHg.  Patient recently hospitalized 11/04/2019-11/06/2019 for acute kidney injury after presenting from home with slurred speech and generalized weakness which resolved at ED arrival.  She was found to be mildly hypotensive and was resuscitated with IVF fluid hydration.  Baseline creatinine was 0.9 however during  the hospital admission found to be 2.29.  MRI is found to be negative for acute stroke.  Echocardiogram performed at that time as above.  Past Medical History:  Diagnosis Date  . Allergic rhinitis   . Ambulates with cane    straight  . Anemia   . Anxiety   . Asthma    seasonal  . Bipolar affective disorder (HCC)    takes Synthroid meds for Bipolar  . CAD (coronary artery disease) 07/2016   by CT scan  . Carpal tunnel syndrome    had surgery but occasional still has some issues per patient  . Cataract   . Centrilobular emphysema (HCC) 07/2016   by CT scan - pt not aware of this  . Constipation due to pain medication   . Depression with anxiety   . Diabetes mellitus    type 2 - no meds diet controlled  . GERD (gastroesophageal reflux disease)   . History of blood transfusion   . 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 Hyperlipidemia   . Hypertension   . OSA (obstructive sleep apnea)    no longer using cpap, uses a bed that raises and lowers hob  . Osteoarthritis   . Osteopenia 08/11/2019   DEXA 07/2019 - T -1.1 R femur (osteopenia)  . Pneumonia   . Restless legs   . Septic arthritis (HCC) 10/11/2012  . Shingles 06/30/2016  . Status post revision of total hip replacement bilateral   prosthetic infection R 2013, L 2015  . Thoracic aortic atherosclerosis (HCC) 11/207   by CT    Past Surgical History:  Procedure Laterality Date  . BREAST CYST ASPIRATION    . CARPAL TUNNEL RELEASE Right 05/15/2011  . CARPAL TUNNEL RELEASE Left 12/24/2017   Procedure: LEFT CARPAL TUNNEL RELEASE;  Surgeon: Amy Loa, MD;  Location: [facility];  Service: Orthopedics;  Laterality: Left;  . CERVICAL FUSION  2012   C2/3/4  . COLONOSCOPY WITH PROPOFOL N/A 12/31/2015   diverticulosis, int hem, o/w normal rpt 10 yrs (Rein)  . EYE SURGERY Bilateral    cataract surgery with lens implant  . FOOT SURGERY  1982   bone spur  . I & D EXTREMITY   09/06/2012   Amy Palmer, MD; Right;  I&D of right thigh  . I & D EXTREMITY Left 07/2013   wound vac - daily doxycycline indefinitely  . KNEE ARTHROSCOPY  09/01/2012   Amy Huh, MD;  Right  . LUMBAR FUSION  01/08/2012   L3-4  . LUMBAR FUSION  02/2019   unexpectedly discovered MRSA infection - pus Lovell Sheehan)   . LUMBAR LAMINECTOMY/DECOMPRESSION MICRODISCECTOMY N/A 11/13/2016   LAMINOTOMY/LAMINECTOMY LUMBAR FOUR LUMBAR FIVE  WITH RESECTION OF SYNOVIAL CYST;  Surgeon: Tressie Stalker, MD  . NECK SURGERY     Herniated disk C2,3,4  . NOSE SURGERY    . PARTIAL HYSTERECTOMY  1984   for mennorhagia, ovaries remain  . REVISION TOTAL HIP ARTHROPLASTY Left 08/28/2011  . TMJ ARTHROPLASTY  1982  . TOTAL HIP ARTHROPLASTY Left 2002  . TRIGGER FINGER RELEASE Right 05/15/2011   long finger     Prior to Admission medications  Medication Sig Start Date End Date Taking? Authorizing Provider  albuterol (PROVENTIL HFA;VENTOLIN HFA) 108 (90 Base) MCG/ACT inhaler Inhale 2 puffs into the lungs every 6 (six) hours as needed for wheezing or shortness of breath. 12/14/18  Yes Amy Boyden, MD  aspirin 81 MG EC tablet Take 81 mg by mouth at bedtime.    Yes [provider]  budesonide-formoterol (SYMBICORT) 160-4.5 MCG/ACT inhaler Inhale 2 puffs into the lungs 2 (two) times daily. 11/07/19  Yes Amy Boyden, MD  celecoxib (CELEBREX) 200 MG capsule TAKE ONE CAPSULE BY MOUTH DAILY Patient taking differently: Take 200 mg by mouth daily.  06/14/19  Yes Amy Boyden, MD  docusate sodium (COLACE) 100 MG capsule Take 1 capsule (100 mg total) by mouth 2 (two) times daily. 11/06/19  Yes Myrtie Neither, MD  doxycycline (VIBRA-TABS) 100 MG tablet Take 100 mg by mouth 2 (two) times daily. 07/31/19  Yes [provider]  FLUoxetine (PROZAC) 40 MG capsule Take by mouth daily. 03/08/19  Yes [provider]  lamoTRIgine (LAMICTAL) 200 MG tablet Take 200 mg by mouth daily.     Yes [provider]  levothyroxine (SYNTHROID) 150 MCG tablet Take 1 tablet (150 mcg total) by mouth at bedtime. 01/21/19  Yes Amy Boyden, MD  methocarbamol (ROBAXIN-750) 750 MG tablet Take 1 tablet (750 mg total) by mouth every 6 (six) hours as needed for muscle spasms. 03/25/19  Yes Bergman, Meghan D, NP  montelukast (SINGULAIR) 10 MG tablet TAKE 1 TABLET BY MOUTH EVERYDAY AT BEDTIME Patient taking differently: Take 10 mg by mouth at bedtime.  02/28/19  Yes Amy Boyden, MD  Multiple Vitamin (MULTIVITAMIN WITH MINERALS) TABS tablet Take 2 tablets by mouth daily.    Yes [provider]  nystatin (MYCOSTATIN/NYSTOP) powder Apply topically 4 (four) times daily. Patient taking differently: Apply 1 application topically 4 (four) times daily as needed (rash).  02/22/19  Yes Reva Bores, MD  ondansetron (ZOFRAN ODT) 8 MG disintegrating tablet Take 1 tablet (8 mg total) by mouth 2 (two) times daily as needed for nausea or vomiting. 11/02/19  Yes Amy Boyden, MD  Oxcarbazepine (TRILEPTAL) 300 MG tablet Take 300-600 mg by mouth See admin instructions. Take 300 mg by mouth in the morning and 600 mg at night   Yes [provider]  oxyCODONE 10 MG TABS Take 1 tablet (10 mg total) by mouth every 4 (four) hours as needed for severe pain ((score 7 to 10)). 03/25/19  Yes Val Eagle D, NP  pantoprazole (PROTONIX) 40 MG tablet Take 1 tablet (40 mg total) by mouth daily. Patient taking differently: Take 40 mg by mouth 2 (two) times daily.  01/21/19  Yes Amy Boyden, MD  simvastatin (ZOCOR) 40 MG tablet Take 40 mg by mouth every evening.   Yes [provider]  sucralfate (CARAFATE) 1 g tablet Take 1 tablet (1 g total) by mouth 4 (four) times daily -  with meals and at bedtime. 11/03/19  Yes Amy Boyden, MD  traMADol (ULTRAM) 50 MG tablet Take 50-100 mg by mouth every 6 (six) hours as needed for moderate pain.  04/27/19  Yes [provider]  Cholecalciferol  (VITAMIN D3) 25 MCG (1000 UT) CAPS Take 1 capsule (1,000 Units total) by mouth daily. Patient not taking: Reported on 11/04/2019 08/11/19   Amy Boyden, MD  oxybutynin (DITROPAN-XL) 10 MG 24 hr tablet Take 1 tablet (10 mg total) by mouth daily. 11/03/19   Jerilee Field, MD  promethazine (PHENERGAN) 25 MG  suppository Place 1 suppository (25 mg total) rectally every 6 (six) hours as needed for nausea or vomiting. 11/08/19   Amy Boyden, MD    Inpatient Medications: Scheduled Meds: . carvedilol  6.25 mg Oral BID  . folic acid  1 mg Oral Daily  . insulin aspart  0-9 Units Subcutaneous Q4H  . levothyroxine  150 mcg Oral QHS  . mometasone-formoterol  2 puff Inhalation BID  . montelukast  10 mg Oral QHS  . multivitamin with minerals  1 tablet Oral Daily  . simvastatin  40 mg Oral QPM  . sodium chloride flush  3 mL Intravenous Q12H  . thiamine  100 mg Oral Daily   Or  . thiamine  100 mg Intravenous Daily   Continuous Infusions: . sodium chloride    . heparin 1,400 Units/hr (11/09/19 0910)   PRN Meds: sodium chloride, albuterol, LORazepam **OR** LORazepam, morphine injection, prochlorperazine, sodium chloride flush  Allergies:    Allergies  Allergen Reactions  . Cephalexin Hives  . Hydrocodone Other (See Comments)    Reaction:  Hallucinations   . Tolterodine Tartrate Nausea And Vomiting  . Risperidone And Related Other (See Comments)    Reaction:  Made pt excessively sleepy  . Seroquel [Quetiapine Fumarate] Other (See Comments)    Reaction:  Made pt excessively sleepy  . Sulfa Antibiotics Rash    Social History:   Social History   Socioeconomic History  . Marital status: Married    Spouse name: Not on file  . Number of children: 3  . Years of education: Not on file  . Highest education level: Not on file  Occupational History  . Occupation: Designer, industrial/product: UNEMPLOYED  Tobacco Use  . Smoking status: Former Smoker    Packs/day: 1.50    Years: 40.00     Pack years: 60.00    Types: Cigarettes    Quit date: 04/29/2009    Years since quitting: 10.5  . Smokeless tobacco: Never Used  Substance and Sexual Activity  . Alcohol use: Yes    Alcohol/week: 21.0 standard drinks    Types: 21 Shots of liquor per week    Comment: 3 shots/per night  . Drug use: No  . Sexual activity: Never    Comment: Hysterectomy  Other Topics Concern  . 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 Difficulty of Paying Living Expenses: Not on file  Food Insecurity:   . Worried About Programme researcher, broadcasting/film/video in the Last Year: Not on file  . Ran Out of Food in the Last Year: Not on file  Transportation Needs:   . Lack of Transportation (Medical): Not on file  . Lack of Transportation (Non-Medical): Not on file  Physical Activity:   . Days of Exercise per Week: Not on file  . Minutes of Exercise per Session: Not on file  Stress:   . Feeling of Stress : Not on file  Social Connections:   . Frequency of Communication with Friends and Family: Not on file  . Frequency of Social Gatherings with Friends and Family: Not on file  . Attends Religious Services: Not on file  . Active Member of Clubs or Organizations: Not on file  .  Attends Banker Meetings: Not on file  . Marital Status: Not on file  Intimate Partner Violence:   . Fear of Current or Ex-Partner: Not on file  . Emotionally Abused: Not on file  . Physically Abused: Not on file  . Sexually Abused: Not on file    Family History:   Family History  Problem Relation Age of Onset  . Aneurysm Father 62       brain  . Alcohol abuse Father   . Cancer Father        possibly  . CAD Other        several siblings  . Cancer Brother        prostate  . Diabetes Brother    . Diabetes Sister   . Anesthesia problems Neg Hx   . Hypotension Neg Hx   . Malignant hyperthermia Neg Hx   . Pseudochol deficiency Neg Hx   . Breast cancer Neg Hx    Family Status:  Family Status  Relation Name Status  . Father  Deceased at age 31  . Mother  Deceased at age 59       MVA  . Other  (Not Specified)  . Brother x2 (Not Specified)  . Brother  Deceased  . Sister  Deceased  . Neg Hx  (Not Specified)    ROS:  Please see the history of present illness.  All other ROS reviewed and negative.     Physical Exam/Data:   Vitals:   11/09/19 0600 11/09/19 0653 11/09/19 0800 11/09/19 0930  BP: (!) 154/83 (!) 150/78 (!) 149/88 (!) 164/92  Pulse: 95 97 86 91  Resp: 18 20 18 20   Temp:      TempSrc:      SpO2: 100% 100% 100% 100%  Weight:      Height:        Intake/Output Summary (Last 24 hours) at 11/09/2019 1019 Last data filed at 11/09/2019 0910 Gross per 24 hour  Intake 1117.55 ml  Output --  Net 1117.55 ml   Filed Weights   11/08/19 1829  Weight: (!) 166.8 kg   Body mass index is 63.13 kg/m.   General: Morbid obesity, NAD Skin: Warm, dry, intact  Head: Normocephalic, atraumatic, clear, moist mucus membranes. Neck: Negative for carotid bruits. No JVD Lungs: Diminished in bilateral lobes secondary to body habitus. Breathing is unlabored. Cardiovascular: RRR with S1 S2. No murmurs Abdomen: Soft, non-tender, distended. No obvious abdominal masses. Extremities: No edema. DP pulses 1+ bilaterally Neuro: Alert and oriented. No focal deficits. No facial asymmetry. MAE spontaneously. Psych: Responds to questions appropriately with normal affect.     EKG:  The EKG was personally reviewed and demonstrates: 11/09/2019 NSR, HR 96 bpm, LBBB, TWI in the inferior and lateral leads.  New lateral lead inversion when compared to EKG from 03/18/2019.  QTc, 03/20/2019  Telemetry:  Telemetry was personally reviewed and demonstrates: 11/09/2019 NSR, HR 80s to 90s  Relevant CV  Studies:  Echocardiogram: Pending   Echocardiogram 11/05/19:  1. Left ventricular ejection fraction, by visual estimation, is 60 to  65%. The left ventricle has normal function. There is borderline left  ventricular hypertrophy.  2. Definity contrast agent was given IV to delineate the left ventricular  endocardial borders.  3. Elevated left ventricular end-diastolic pressure.  4. Left ventricular diastolic parameters are consistent with Grade I  diastolic dysfunction (impaired relaxation).  5. The left ventricle has no regional wall motion abnormalities.  6. Global right ventricle  has normal systolic function.The right  ventricular size is mildly enlarged. No increase in right ventricular wall  thickness.  7. Left atrial size was normal.  8. Right atrial size was normal.  9. Severe mitral annular calcification.  10. The mitral valve is degenerative. Trivial mitral valve regurgitation.  11. The tricuspid valve is grossly normal.  12. The tricuspid valve is grossly normal. Tricuspid valve regurgitation  is trivial.  13. The aortic valve is tricuspid. Aortic valve regurgitation is not  visualized. No evidence of aortic valve sclerosis or stenosis.  14. The pulmonic valve was grossly normal. Pulmonic valve regurgitation is  trivial.  15. The inferior vena cava is normal in size with greater than 50%  respiratory variability, suggesting right atrial pressure of 3 mmHg.    Laboratory Data:  Chemistry Recent Labs  Lab 11/05/19 0510 11/06/19 1013 11/08/19 2057  NA 126* 135 138  K 3.3* 4.1 4.2  CL 93* 102 100  CO2 22 25 26   GLUCOSE 145* 172* 217*  BUN 39* 28* 11  CREATININE 2.26* 0.94 0.56  CALCIUM 8.4* 8.8* 9.0  GFRNONAA 22* >60 >60  GFRAA 25* >60 >60  ANIONGAP 11 8 12     Total Protein  Date Value Ref Range Status  11/08/2019 7.4 6.5 - 8.1 g/dL Final  16/06/9603 7.0 6.0 - 8.5 g/dL Final  54/05/8118 9.1 (H) 6.4 - 8.2 g/dL Final   Albumin  Date Value Ref  Range Status  11/08/2019 4.2 3.5 - 5.0 g/dL Final  14/78/2956 4.7 3.6 - 4.8 g/dL Final  21/30/8657 3.3 (L) 3.4 - 5.0 g/dL Final   AST  Date Value Ref Range Status  11/08/2019 28 15 - 41 U/L Final  06/18/2015 19 U/L Final   ALT  Date Value Ref Range Status  11/08/2019 32 0 - 44 U/L Final  06/18/2015 21  Final   Alkaline Phosphatase  Date Value Ref Range Status  11/08/2019 83 38 - 126 U/L Final  06/18/2015 78 U/L Final   Total Bilirubin  Date Value Ref Range Status  11/08/2019 0.6 0.3 - 1.2 mg/dL Final  84/69/6295 0.3 mg/dL Final   Bilirubin Total  Date Value Ref Range Status  09/14/2018 0.4 0.0 - 1.2 mg/dL Final   Hematology Recent Labs  Lab 11/04/19 1535 11/05/19 0510 11/08/19 2057  WBC 11.0* 8.7 8.5  RBC 4.82 4.61 4.97  HGB 13.7 13.1 14.1  HCT 40.9 40.0 43.0  MCV 84.9 86.8 86.5  MCH 28.4 28.4 28.4  MCHC 33.5 32.8 32.8  RDW 15.6* 15.7* 16.0*  PLT 255 205 288   Cardiac EnzymesNo results for input(s): TROPONINI in the last 168 hours. No results for input(s): TROPIPOC in the last 168 hours.  BNP Recent Labs  Lab 11/09/19 0504  BNP 565.6*    DDimer  Recent Labs  Lab 11/08/19 2307  DDIMER 2.09*   TSH:  Lab Results  Component Value Date   TSH 1.050 11/09/2019   Lipids: Lab Results  Component Value Date   CHOL 139 11/09/2019   HDL 58 11/09/2019   LDLCALC 73 11/09/2019   LDLDIRECT 48.0 03/13/2016   TRIG 41 11/09/2019   CHOLHDL 2.4 11/09/2019   HgbA1c: Lab Results  Component Value Date   HGBA1C 7.3 (H) 08/08/2019    Radiology/Studies:  CT ABDOMEN PELVIS WO CONTRAST  Result Date: 11/08/2019 CLINICAL DATA:  Nausea, vomiting, abdominal pain for 2 weeks EXAM: CT ABDOMEN AND PELVIS WITHOUT CONTRAST TECHNIQUE: Multidetector CT imaging of the abdomen and pelvis was performed  following the standard protocol without IV contrast. COMPARISON:  10/31/2019 FINDINGS: Lower chest: Left ventricular calcifications are unchanged. No pericardial effusion.  Bibasilar interlobular septal thickening and vascular prominence consistent with interstitial edema. Hepatobiliary: No focal liver abnormality is seen. No gallstones, gallbladder wall thickening, or biliary dilatation. Pancreas: Unremarkable. No pancreatic ductal dilatation or surrounding inflammatory changes. Spleen: Normal in size without focal abnormality. Adrenals/Urinary Tract: There is a punctate less than 2 mm nonobstructing calculus right kidney, reference image 44 series 2. No obstructive uropathy. The left kidney is unremarkable. Bladder is grossly normal. The adrenals are unremarkable. Stomach/Bowel: No bowel obstruction or ileus. Normal appendix right lower quadrant. Vascular/Lymphatic: Mild calcified plaque within the distal aorta at the bifurcation. No pathologic adenopathy within the abdomen or pelvis. Reproductive: Status post hysterectomy. No adnexal masses. Other: Small fat containing umbilical hernia unchanged. No free fluid or free gas within the abdomen or pelvis. Musculoskeletal: Left hip arthroplasty is identified. Postsurgical changes lower lumbar spine. No acute or destructive bony lesions. IMPRESSION: 1. Punctate 2 mm nonobstructing right renal calculus. 2. Bibasilar interstitial edema. 3. Stable fat containing umbilical hernia. Electronically Signed   By: Sharlet Salina M.D.   On: 11/08/2019 21:22   CT Angio Chest PE W and/or Wo Contrast  Result Date: 11/09/2019 CLINICAL DATA:  Shortness of breath, elevated troponin and D-dimer EXAM: CT ANGIOGRAPHY CHEST WITH CONTRAST TECHNIQUE: Multidetector CT imaging of the chest was performed using the standard protocol during bolus administration of intravenous contrast. Multiplanar CT image reconstructions and MIPs were obtained to evaluate the vascular anatomy. CONTRAST:  OMNIPAQUE IOHEXOL 350 MG/ML SOLN COMPARISON:  CT chest 08/29/2019 FINDINGS: Cardiovascular: Satisfactory opacification the pulmonary arteries to the segmental level. No  pulmonary artery filling defects are identified. Central pulmonary arteries are normal caliber. Cardiac size is borderline enlarged. There is dense calcification of mitral annulus. Left ventricular calcification likely reflects sequela of prior infarct. There is lipomatous hypertrophy of the intra-atrial septum. Three-vessel coronary artery calcifications are noted. Atherosclerotic plaque within the normal caliber aorta. Minimal plaque within the normally branching great vessels. Mediastinum/Nodes: Scattered low-attenuation mediastinal and hilar adenopathy with several borderline enlarged lymph nodes including the 12 mm precarinal lymph node (4/40) and a 16 mm subcarinal lymph node (4/50). No mediastinal fluid or gas. No acute abnormality of the trachea or esophagus. Posterior bowing of the trachea likely related to imaging during exhalation for the angiographic technique. Included portions of thyroid gland are unremarkable. Lungs/Pleura: Widespread multifocal areas of mixed ground-glass and consolidative opacity on a background interlobular septal thickening and cephalized vascularity with small bilateral pleural effusions, right greater than left. Atelectatic changes are present in the lungs with some mosaic attenuation which could reflect imaging during exhalation or small airways disease/air trapping. Upper Abdomen: No acute abnormalities present in the visualized portions of the upper abdomen. Musculoskeletal: Multilevel degenerative changes are present in the imaged portions of the spine. No acute osseous abnormality or suspicious osseous lesion. Prior cervical spinal fusion is partially included in the level of imaging without acute hardware complication. Review of the MIP images confirms the above findings. IMPRESSION: 1. No evidence of acute pulmonary embolism. 2. Widespread multifocal areas of mixed ground-glass and consolidative opacity on a background of interlobular septal thickening and cephalized  vascularity with small bilateral pleural effusions, right greater than left. Findings are favored to reflect CHF with pulmonary edema though superimposed infection is not fully excluded. 3. Scattered borderline enlarged mediastinal lymph nodes are likely reactive in etiology. 4. Three-vessel coronary artery calcifications.  Left ventricular calcification likely reflects sequela of prior infarct. 5. Lipomatous hypertrophy of the intra-atrial septum. 6. Aortic Atherosclerosis (ICD10-I70.0). Electronically Signed   By: Kreg Shropshire M.D.   On: 11/09/2019 02:14   ECHOCARDIOGRAM COMPLETE  Result Date: 11/05/2019   ECHOCARDIOGRAM REPORT   Patient Name:   IRIANA ARTLEY Verga Date of Exam: 11/05/2019 Medical Rec #:  [id number]     Height:       64.0 in Accession #:    [id number]    Weight:       358.5 lb Date of Birth:  07/07/1951      BSA:          2.51 m Patient Age:    68 years      BP:           119/46 mmHg Patient Gender: F             HR:           69 bpm. Exam Location:  Inpatient Procedure: 2D Echo, Color Doppler, Cardiac Doppler and Intracardiac            Opacification Agent Indications:    Syncope 780.2 / R55  History:        Patient has no prior history of Echocardiogram examinations.                 CAD, Signs/Symptoms:Dyspnea; Risk Factors:Hypertension,                 Diabetes, Dyslipidemia, Former Smoker and Obesity. Thoracic                 aortic atherosclerosis.  Sonographer:    Leeroy Bock Turrentine Referring Phys: [id number] PARDEEP [id number]  Sonographer Comments: Image acquisition challenging due to patient body habitus. IMPRESSIONS  1. Left ventricular ejection fraction, by visual estimation, is 60 to 65%. The left ventricle has normal function. There is borderline left ventricular hypertrophy.  2. Definity contrast agent was given IV to delineate the left ventricular endocardial borders.  3. Elevated left ventricular end-diastolic pressure.  4. Left ventricular diastolic parameters are consistent with Grade I diastolic  dysfunction (impaired relaxation).  5. The left ventricle has no regional wall motion abnormalities.  6. Global right ventricle has normal systolic function.The right ventricular size is mildly enlarged. No increase in right ventricular wall thickness.  7. Left atrial size was normal.  8. Right atrial size was normal.  9. Severe mitral annular calcification. 10. The mitral valve is degenerative. Trivial mitral valve regurgitation. 11. The tricuspid valve is grossly normal. 12. The tricuspid valve is grossly normal. Tricuspid valve regurgitation is trivial. 13. The aortic valve is tricuspid. Aortic valve regurgitation is not visualized. No evidence of aortic valve sclerosis or stenosis. 14. The pulmonic valve was grossly normal. Pulmonic valve regurgitation is trivial. 15. The inferior vena cava is normal in size with greater than 50% respiratory variability, suggesting right atrial pressure of 3 mmHg. FINDINGS  Left Ventricle: Left ventricular ejection fraction, by visual estimation, is 60 to 65%. The left ventricle has normal function. Definity contrast agent was given IV to delineate the left ventricular endocardial borders. The left ventricle has no regional wall motion abnormalities. The left ventricular internal cavity size was the left ventricle is normal in size. There is borderline left ventricular hypertrophy. Left ventricular diastolic parameters are consistent with Grade I diastolic dysfunction (impaired relaxation). Elevated left ventricular end-diastolic pressure. Right Ventricle: The right ventricular size is mildly enlarged. No increase in right ventricular  wall thickness. Global RV systolic function is has normal systolic function. Left Atrium: Left atrial size was normal in size. Right Atrium: Right atrial size was normal in size Pericardium: There is no evidence of pericardial effusion. Mitral Valve: The mitral valve is degenerative in appearance. Severe mitral annular calcification. Trivial mitral  valve regurgitation. Tricuspid Valve: The tricuspid valve is grossly normal. Tricuspid valve regurgitation is trivial. Aortic Valve: The aortic valve is tricuspid. Aortic valve regurgitation is not visualized. The aortic valve is structurally normal, with no evidence of sclerosis or stenosis. Mild aortic valve annular calcification. Aortic valve mean gradient measures 8.0 mmHg. Aortic valve peak gradient measures 15.7 mmHg. Aortic valve area, by VTI measures 2.24 cm. Pulmonic Valve: The pulmonic valve was grossly normal. Pulmonic valve regurgitation is trivial. Pulmonic regurgitation is trivial. Aorta: The aortic root is normal in size and structure. Venous: The inferior vena cava is normal in size with greater than 50% respiratory variability, suggesting right atrial pressure of 3 mmHg. IAS/Shunts: No atrial level shunt detected by color flow Doppler.  LEFT VENTRICLE PLAX 2D LVIDd:         5.50 cm  Diastology LVIDs:         3.40 cm  LV e' medial:   7.40 cm/s LV PW:         1.00 cm  LV E/e' medial: 19.1 LV IVS:        1.00 cm LVOT diam:     2.00 cm LV SV:         100 ml LV SV Index:   35.43 LVOT Area:     3.14 cm  RIGHT VENTRICLE RV S prime:     15.30 cm/s LEFT ATRIUM             Index       RIGHT ATRIUM           Index LA diam:        5.20 cm 2.07 cm/m  RA Area:     17.10 cm LA Vol (A2C):   66.6 ml 26.57 ml/m RA Volume:   43.90 ml  17.51 ml/m LA Vol (A4C):   82.1 ml 32.75 ml/m LA Biplane Vol: 75.3 ml 30.04 ml/m  AORTIC VALVE AV Area (Vmax):    2.62 cm AV Area (Vmean):   2.58 cm AV Area (VTI):     2.24 cm AV Vmax:           198.00 cm/s AV Vmean:          123.000 cm/s AV VTI:            0.349 m AV Peak Grad:      15.7 mmHg AV Mean Grad:      8.0 mmHg LVOT Vmax:         165.00 cm/s LVOT Vmean:        101.000 cm/s LVOT VTI:          0.249 m LVOT/AV VTI ratio: 0.71  AORTA Ao Root diam: 2.90 cm MITRAL VALVE MV Area (PHT): 2.74 cm              SHUNTS MV PHT:        80.33 msec            Systemic VTI:  0.25 m  MV Decel Time: 277 msec              Systemic Diam: 2.00 cm MV E velocity: 141.00 cm/s 103 cm/s MV A velocity: 134.00 cm/s  70.3 cm/s MV E/A ratio:  1.05        1.5  Prentice Docker MD Electronically signed by Prentice Docker MD Signature Date/Time: 11/05/2019/12:41:25 PM    Final    Assessment and Plan:   1. NSTEMI: -Patient presented with abdominal pain, nausea vomiting for the last several weeks (was previously hospitalized and discharged 05/05/2020).  Initially her symptoms improved for several days however returned on day of presentation.  During ED work-up, after returning from abdominal CT patient reported midsternal chest pressure with associated shortness of breath.  EKG performed with no significant acute changes from prior tracing. She does have LBBB and TWI's inferior lateral leads when compared to EKG from 03/18/2019. HsT found to be 193, 309, 318.  Chest CTA performed in the setting of elevated D-dimer which showed no acute PE with widespread multifocal areas of mixed groundglass and consolidative opacities consistent with CHF with pulmonary edema also noted to have three-vessel coronary artery calcifications and left ventricular calcification reflecting sequela of prior infarct.  Patient was given ASA 324 mg and heparin infusion was initiated. -Patient has many CRF's including morbid obesity, DM 2, hypertension, hyperlipidemia, previous tobacco use and strong family history of CAD in her mother, sister and brother with previous MI and CABG.  -Would consider further evaluation with coronary CT versus cardiac catheterization to fully assess coronary anatomy at this time given risk factors as described above.  As above, chest CTA with three-vessel coronary artery disease therefore proceeding with cardiac catheterization may be the most definitive option at this time.  Given patient's body habitus, not a candidate for coronary CTA -Prior echocardiogram with normal LV function, G1 DD and no regional  wall motion abnormalities performed 11/05/2019 during recent hospitalization.  Repeat echocardiogram with pending results at this time -Elevated BNP on ED arrival with evidence of CHF on CTA therefore patient was given IV Lasix 40 mg -Continue ASA, statin -Add low-dose beta-blocker  2.  Abdominal pain, emesis: -Patient reports a several week history of abdominal pain located in the epigastric and periumbilical regions with associated nausea and vomiting. Abdominal CT with no acute abnormality.  Lab workup essentially normal with no specific etiology for symptoms -Continue management per primary team -Questionable anginal equivalent  3.  Acute hypoxic respiratory failure secondary to pulmonary edema: -Patient presented with abdominal pain however found to have an O2 saturation at 88% on room air on the ED presentation improved with 4 L supplemental oxygen.  CTA was negative for acute PE however did have widespread multifocal areas of mixed groundglass and consolidative opacities consistent with CHF and pulmonary edema.  -Has history of asthma on home therapy -Patient given 1 dose IV Lasix 40 mg in the ED -Weight, 367lb  -I&O, net +1050 -Stable O2 saturations at this time -Echocardiogram pending  -Prior echocardiogram from 11/05/2019 with normal LV function and G1 DD and no regional wall motion abnormalities  4.  Prolonged QTC: -QTC on EKG found to be -Zofran discontinued>> has been taking at home secondary to nausea and vomiting -Keep K+ greater than 4.0 and magnesium greater than 2.0  5.  HTN: -Elevated, 149/88, 150/78, 154/83 -Add beta-blocker to therapy given coronary RF's  -Likely will need to add ACE/ARB   6.  Hyperlipidemia: -On home Zocor -LDL 11/09/2019, 73  7.  DM2: -Reported as diet controlled however hemoglobin A1c found to be elevated at 7.3 08/08/2019 -Repeat Hemoglobin A1c this admission -SSI for glucose control inpatient status   For questions or updates,  please  contact [place] Please consult [email or web address] for contact info under Cardiology/STEMI.   Lyndel Safe NP-C [facility] Pager: [phone] 11/09/2019 10:19 AM   History and all data above reviewed.  Patient examined.   The patient has no past cardiac history but she does have significant risk factors.  She has had now her second presentation with abdominal complaints predominantly.  She had dehydration and was treated for urinary infection recently.  This time she came in with acute nausea and throwing up.  She said this happened at rest.  Sounds like she is very sedentary at baseline.  She is morbidly obese and has lots of back and joint issues.  She ambulates with canes and it does not sound like she can go very far but she does in the house.  She does get short of breath because of pain if she tries to ambulate very far.  She does not bring on chest pressure, neck or arm discomfort.  She cannot really associate nausea and vomiting that she has had with activity.  Seems to come out of the blue.  However, she does have chronic abnormal EKG with left bundle branch block.  She is never had any prior cardiac work-up that she can report.  She this visit is found to have elevated troponin and slightly increasing and is noted to have coronary calcium on a CT to rule out pulmonary embolism.  She is not having any chest pressure, neck or arm discomfort.  She is not describing PND or orthopnea.  She is not had any new palpitations, presyncope or syncope.  I agree with the findings as above.  The patient exam reveals COR:RRR  ,  Lungs: Clear  ,  Abd: Positive bowel sounds, no rebound no guarding, Ext No edema  .  All available labs, radiology testing, previous records reviewed. Agree with documented assessment and plan.   NAUSEA/VOMITING: This certainly could be an anginal equivalent.  She has obvious coronary calcification.  She has an abnormal EKG which is not acute but uninterpretable for acute ST  changes.  She has significant cardiovascular risk factors.  She has an elevated troponin with a delta upwards.  Given all of this the pretest probability of obstructive coronary disease as the etiology of the least some of her complaints is at least moderately high.  She not a candidate for noninvasive imaging given her size.  I think cardiac catheterization is indicated.  I discussed this with patient and her daughter. The patient understands that risks included but are not limited to stroke (1 in 1000), death (1 in 78), kidney failure [usually temporary] (1 in 500), bleeding (1 in 200), allergic reaction [possibly serious] (1 in 200).  The patient understands and agrees to proceed.   They would agree to proceed.  We will transfer [facility] to [facility] with the plans for cardiac catheterization tomorrow.   Minus Breeding  10:53 AM  11/09/2019

## 2019-11-10 ENCOUNTER — Encounter: Payer: Self-pay | Admitting: Family Medicine

## 2019-11-10 ENCOUNTER — Ambulatory Visit: Payer: Medicare Other | Admitting: Internal Medicine

## 2019-11-10 ENCOUNTER — Encounter (HOSPITAL_COMMUNITY)
Admission: EM | Disposition: A | Discharge status: Home / Self Care | Payer: Self-pay | Source: Home / Self Care | Attending: Student

## 2019-11-10 DIAGNOSIS — F329 Major depressive disorder, single episode, unspecified: Secondary | ICD-10-CM

## 2019-11-10 DIAGNOSIS — I059 Rheumatic mitral valve disease, unspecified: Secondary | ICD-10-CM

## 2019-11-10 DIAGNOSIS — R778 Other specified abnormalities of plasma proteins: Secondary | ICD-10-CM

## 2019-11-10 DIAGNOSIS — R112 Nausea with vomiting, unspecified: Secondary | ICD-10-CM

## 2019-11-10 DIAGNOSIS — R1084 Generalized abdominal pain: Secondary | ICD-10-CM

## 2019-11-10 DIAGNOSIS — D649 Anemia, unspecified: Secondary | ICD-10-CM

## 2019-11-10 DIAGNOSIS — E1165 Type 2 diabetes mellitus with hyperglycemia: Secondary | ICD-10-CM

## 2019-11-10 DIAGNOSIS — I5041 Acute combined systolic (congestive) and diastolic (congestive) heart failure: Secondary | ICD-10-CM

## 2019-11-10 DIAGNOSIS — Z9189 Other specified personal risk factors, not elsewhere classified: Secondary | ICD-10-CM

## 2019-11-10 DIAGNOSIS — F319 Bipolar disorder, unspecified: Secondary | ICD-10-CM

## 2019-11-10 DIAGNOSIS — Z6841 Body Mass Index (BMI) 40.0 and over, adult: Secondary | ICD-10-CM

## 2019-11-10 DIAGNOSIS — F419 Anxiety disorder, unspecified: Secondary | ICD-10-CM

## 2019-11-10 DIAGNOSIS — J9601 Acute respiratory failure with hypoxia: Secondary | ICD-10-CM

## 2019-11-10 DIAGNOSIS — R7989 Other specified abnormal findings of blood chemistry: Secondary | ICD-10-CM

## 2019-11-10 HISTORY — PX: LEFT HEART CATH AND CORONARY ANGIOGRAPHY: CATH118249

## 2019-11-10 LAB — CBC
HCT: 33.7 % — ABNORMAL LOW (ref 36.0–46.0)
Hemoglobin: 10.8 g/dL — ABNORMAL LOW (ref 12.0–15.0)
MCH: 28.5 pg (ref 26.0–34.0)
MCHC: 32 g/dL (ref 30.0–36.0)
MCV: 88.9 fL (ref 80.0–100.0)
Platelets: 223 10*3/uL (ref 150–400)
RBC: 3.79 MIL/uL — ABNORMAL LOW (ref 3.87–5.11)
RDW: 15.9 % — ABNORMAL HIGH (ref 11.5–15.5)
WBC: 7.7 10*3/uL (ref 4.0–10.5)
nRBC: 0 % (ref 0.0–0.2)

## 2019-11-10 LAB — GLUCOSE, CAPILLARY
Glucose-Capillary: 109 mg/dL — ABNORMAL HIGH (ref 70–99)
Glucose-Capillary: 124 mg/dL — ABNORMAL HIGH (ref 70–99)
Glucose-Capillary: 140 mg/dL — ABNORMAL HIGH (ref 70–99)
Glucose-Capillary: 143 mg/dL — ABNORMAL HIGH (ref 70–99)
Glucose-Capillary: 145 mg/dL — ABNORMAL HIGH (ref 70–99)
Glucose-Capillary: 147 mg/dL — ABNORMAL HIGH (ref 70–99)

## 2019-11-10 LAB — BASIC METABOLIC PANEL WITH GFR
Anion gap: 8 (ref 5–15)
BUN: 11 mg/dL (ref 8–23)
CO2: 30 mmol/L (ref 22–32)
Calcium: 8.7 mg/dL — ABNORMAL LOW (ref 8.9–10.3)
Chloride: 98 mmol/L (ref 98–111)
Creatinine, Ser: 0.63 mg/dL (ref 0.44–1.00)
GFR calc Af Amer: >60 mL/min
GFR calc non Af Amer: >60 mL/min
Glucose, Bld: 159 mg/dL — ABNORMAL HIGH (ref 70–99)
Potassium: 3.9 mmol/L (ref 3.5–5.1)
Sodium: 136 mmol/L (ref 135–145)

## 2019-11-10 LAB — HEMOGLOBIN A1C
Hgb A1c MFr Bld: 7.1 % — ABNORMAL HIGH (ref 4.8–5.6)
Mean Plasma Glucose: 157 mg/dL

## 2019-11-10 LAB — HEPARIN LEVEL (UNFRACTIONATED): Heparin Unfractionated: 0.29 IU/mL — ABNORMAL LOW (ref 0.30–0.70)

## 2019-11-10 SURGERY — LEFT HEART CATH AND CORONARY ANGIOGRAPHY
Anesthesia: LOCAL

## 2019-11-10 MED ORDER — DIAZEPAM 5 MG PO TABS: 5.0000 mg | ORAL_TABLET | Freq: Four times a day (QID) | ORAL | Status: DC | PRN

## 2019-11-10 MED ORDER — HEPARIN SODIUM (PORCINE) 1000 UNIT/ML IJ SOLN
INTRAMUSCULAR | Status: DC | PRN
  Administered 2019-11-10: 8000 [IU] via INTRAVENOUS

## 2019-11-10 MED ORDER — HYDRALAZINE HCL 20 MG/ML IJ SOLN: 10.0000 mg | INTRAMUSCULAR | Status: AC | PRN

## 2019-11-10 MED ORDER — LABETALOL HCL 5 MG/ML IV SOLN: 10.0000 mg | INTRAVENOUS | Status: AC | PRN

## 2019-11-10 MED ORDER — ACETAMINOPHEN 325 MG PO TABS
650.0000 mg | ORAL_TABLET | ORAL | Status: DC | PRN
  Administered 2019-11-13 – 2019-11-14 (×2): 650 mg via ORAL
  Filled 2019-11-10 (×2): qty 2

## 2019-11-10 MED ORDER — SUCRALFATE 1 GM/10ML PO SUSP
1.0000 g | Freq: Three times a day (TID) | ORAL | Status: DC
  Administered 2019-11-10 – 2019-11-14 (×15): 1 g via ORAL
  Filled 2019-11-10 (×15): qty 10

## 2019-11-10 MED ORDER — LOSARTAN POTASSIUM 25 MG PO TABS
25.0000 mg | ORAL_TABLET | Freq: Two times a day (BID) | ORAL | Status: DC
  Administered 2019-11-10 – 2019-11-14 (×8): 25 mg via ORAL
  Filled 2019-11-10 (×8): qty 1

## 2019-11-10 MED ORDER — PANTOPRAZOLE SODIUM 40 MG PO TBEC
40.0000 mg | DELAYED_RELEASE_TABLET | Freq: Two times a day (BID) | ORAL | Status: DC
  Administered 2019-11-10 – 2019-11-14 (×8): 40 mg via ORAL
  Filled 2019-11-10 (×8): qty 1

## 2019-11-10 MED ORDER — HEPARIN (PORCINE) IN NACL 1000-0.9 UT/500ML-% IV SOLN
INTRAVENOUS | Status: DC | PRN
  Administered 2019-11-10 (×2): 500 mL

## 2019-11-10 MED ORDER — ONDANSETRON HCL 4 MG/2ML IJ SOLN
4.0000 mg | Freq: Four times a day (QID) | INTRAMUSCULAR | Status: DC | PRN
  Administered 2019-11-12 – 2019-11-13 (×4): 4 mg via INTRAVENOUS
  Filled 2019-11-10 (×5): qty 2

## 2019-11-10 MED ORDER — ENSURE ENLIVE PO LIQD
237.0000 mL | Freq: Two times a day (BID) | ORAL | Status: DC
  Administered 2019-11-13 – 2019-11-14 (×3): 237 mL via ORAL

## 2019-11-10 MED ORDER — FENTANYL CITRATE (PF) 100 MCG/2ML IJ SOLN
INTRAMUSCULAR | Status: DC | PRN
  Administered 2019-11-10 (×2): 25 ug via INTRAVENOUS

## 2019-11-10 MED ORDER — ENOXAPARIN SODIUM 80 MG/0.8ML ~~LOC~~ SOLN
80.0000 mg | SUBCUTANEOUS | Status: DC
  Administered 2019-11-10: 80 mg via SUBCUTANEOUS
  Filled 2019-11-10: qty 0.8

## 2019-11-10 MED ORDER — SODIUM CHLORIDE 0.9% FLUSH
3.0000 mL | INTRAVENOUS | Status: DC | PRN
  Administered 2019-11-13: 3 mL via INTRAVENOUS

## 2019-11-10 MED ORDER — VERAPAMIL HCL 2.5 MG/ML IV SOLN
INTRAVENOUS | Status: DC | PRN
  Administered 2019-11-10: 10 mL via INTRA_ARTERIAL

## 2019-11-10 MED ORDER — MIDAZOLAM HCL 2 MG/2ML IJ SOLN
INTRAMUSCULAR | Status: AC
  Filled 2019-11-10: qty 2

## 2019-11-10 MED ORDER — SODIUM CHLORIDE 0.9 % IV SOLN: INTRAVENOUS | Status: AC

## 2019-11-10 MED ORDER — LAMOTRIGINE 100 MG PO TABS
200.0000 mg | ORAL_TABLET | Freq: Every day | ORAL | Status: DC
  Administered 2019-11-10 – 2019-11-14 (×5): 200 mg via ORAL
  Filled 2019-11-10 (×5): qty 2

## 2019-11-10 MED ORDER — OXCARBAZEPINE 150 MG PO TABS
300.0000 mg | ORAL_TABLET | Freq: Every day | ORAL | Status: DC
  Administered 2019-11-10 – 2019-11-14 (×5): 300 mg via ORAL
  Filled 2019-11-10 (×6): qty 2

## 2019-11-10 MED ORDER — FENTANYL CITRATE (PF) 100 MCG/2ML IJ SOLN
INTRAMUSCULAR | Status: AC
  Filled 2019-11-10: qty 2

## 2019-11-10 MED ORDER — SODIUM CHLORIDE 0.9 % IV SOLN: 250.0000 mL | INTRAVENOUS | Status: DC | PRN

## 2019-11-10 MED ORDER — LIDOCAINE HCL (PF) 1 % IJ SOLN
INTRAMUSCULAR | Status: AC
  Filled 2019-11-10: qty 30

## 2019-11-10 MED ORDER — SENNOSIDES-DOCUSATE SODIUM 8.6-50 MG PO TABS
1.0000 | ORAL_TABLET | Freq: Two times a day (BID) | ORAL | Status: DC | PRN
  Administered 2019-11-10 – 2019-11-14 (×4): 1 via ORAL
  Filled 2019-11-10 (×4): qty 1

## 2019-11-10 MED ORDER — IOHEXOL 350 MG/ML SOLN
INTRAVENOUS | Status: DC | PRN
  Administered 2019-11-10: 65 mL

## 2019-11-10 MED ORDER — SODIUM CHLORIDE 0.9% FLUSH
3.0000 mL | Freq: Two times a day (BID) | INTRAVENOUS | Status: DC
  Administered 2019-11-10 – 2019-11-14 (×5): 3 mL via INTRAVENOUS

## 2019-11-10 MED ORDER — FLUOXETINE HCL 20 MG PO CAPS
40.0000 mg | ORAL_CAPSULE | Freq: Every day | ORAL | Status: DC
  Administered 2019-11-10 – 2019-11-14 (×5): 40 mg via ORAL
  Filled 2019-11-10 (×5): qty 2

## 2019-11-10 MED ORDER — MIDAZOLAM HCL 2 MG/2ML IJ SOLN
INTRAMUSCULAR | Status: DC | PRN
  Administered 2019-11-10: 2 mg via INTRAVENOUS
  Administered 2019-11-10: 1 mg via INTRAVENOUS

## 2019-11-10 MED ORDER — OXCARBAZEPINE 150 MG PO TABS
600.0000 mg | ORAL_TABLET | Freq: Every day | ORAL | Status: DC
  Administered 2019-11-10 – 2019-11-13 (×4): 600 mg via ORAL
  Filled 2019-11-10 (×4): qty 4

## 2019-11-10 MED ORDER — LIDOCAINE HCL (PF) 1 % IJ SOLN
INTRAMUSCULAR | Status: DC | PRN
  Administered 2019-11-10: 3 mL

## 2019-11-10 MED ORDER — HEPARIN (PORCINE) IN NACL 1000-0.9 UT/500ML-% IV SOLN
INTRAVENOUS | Status: AC
  Filled 2019-11-10: qty 1000

## 2019-11-10 SURGICAL SUPPLY — 11 items
CATH OPTITORQUE TIG 4.0 5F (CATHETERS) ×1 IMPLANT
DEVICE RAD COMP TR BAND LRG (VASCULAR PRODUCTS) ×1 IMPLANT
GLIDESHEATH SLEND SS 6F .021 (SHEATH) ×1 IMPLANT
GUIDEWIRE INQWIRE 1.5J.035X260 (WIRE) IMPLANT
HOVERMATT SINGLE USE (MISCELLANEOUS) ×1 IMPLANT
INQWIRE 1.5J .035X260CM (WIRE) ×2
KIT HEART LEFT (KITS) ×2 IMPLANT
PACK CARDIAC CATHETERIZATION (CUSTOM PROCEDURE TRAY) ×2 IMPLANT
SHEATH PROBE COVER 6X72 (BAG) ×1 IMPLANT
TRANSDUCER W/STOPCOCK (MISCELLANEOUS) ×2 IMPLANT
TUBING CIL FLEX 10 FLL-RA (TUBING) ×2 IMPLANT

## 2019-11-10 NOTE — Progress Notes (Signed)
ANTICOAGULATION CONSULT NOTE   Pharmacy Consult for Heparin Indication: chest pain/ACS  Allergies  Allergen Reactions  . Cephalexin Hives  . Hydrocodone Other (See Comments)    Reaction:  Hallucinations   . Tolterodine Tartrate Nausea And Vomiting  . Risperidone And Related Other (See Comments)    Reaction:  Made pt excessively sleepy  . Seroquel [Quetiapine Fumarate] Other (See Comments)    Reaction:  Made pt excessively sleepy  . Sulfa Antibiotics Rash    Patient Measurements: Height: 5\' 4"  (162.6 cm) Weight: (!) 367 lb 1.1 oz (166.5 kg) IBW/kg (Calculated) : 54.7 Heparin Dosing Weight: 98 kg  Vital Signs: Temp: 97.7 F (36.5 C) (02/11 0445) Temp Source: Oral (02/11 0445) BP: 104/57 (02/11 0500) Pulse Rate: 75 (02/11 0027)  Labs: Recent Labs    11/08/19 2057 11/08/19 2309 11/09/19 0230 11/09/19 0341 11/09/19 1058 11/09/19 1250 11/09/19 2013 11/10/19 0353  HGB 14.1  --   --   --   --   --   --  10.8*  HCT 43.0  --   --   --   --   --   --  33.7*  PLT 288  --   --   --   --   --   --  223  APTT  --   --   --  27  --   --   --   --   LABPROT  --   --   --  12.8  --   --   --   --   INR  --   --   --  1.0  --   --   --   --   HEPARINUNFRC  --   --   --   --   --  0.68 0.11* 0.29*  CREATININE 0.56  --   --   --   --   --   --  0.63  TROPONINIHS 106*   < > 309* 318* 393*  --   --   --    < > = values in this interval not displayed.    Estimated Creatinine Clearance: 107.1 mL/min (by C-G formula based on SCr of 0.63 mg/dL).   Medications:  Infusions:  . sodium chloride    . sodium chloride    . sodium chloride 1 mL/kg/hr (11/10/19 0519)  . heparin 1,700 Units/hr (11/10/19 0300)    Assessment: 66 yoF with PMH anemia, asthma, emphysema, bipolar, CAD, DM2, HTN, HLD, hypothyroid, admitted for suspected NSTEMI. Pharmacy to dose IV heparin   Baseline INR, aPTT: WNL  Prior anticoagulation: none  Heparin level just slightly below goal this AM.  No overt  bleeding or complications noted.  Hgb 14.1 > 10.8.  Platelets ok.  Goal of Therapy: Heparin level 0.3-0.7 units/ml Monitor platelets by anticoagulation protocol: Yes  Plan: Increase IV heparin to 1800 units/hr Recheck heparin level in 8 hrs Daily heparin level and CBC. F/u plans for cath today?  79, [facility] Clinical Pharmacist Phone [phone]  11/10/2019 7:31 AM

## 2019-11-10 NOTE — Interval H&P Note (Signed)
Cath Lab Visit (complete for each Cath Lab visit)  Clinical Evaluation Leading to the Procedure:   ACS: No.  Non-ACS:    Anginal Classification: CCS III  Anti-ischemic medical therapy: Minimal Therapy (1 class of medications)  Non-Invasive Test Results: No non-invasive testing performed  Prior CABG: No previous CABG      History and Physical Interval Note:  11/10/2019 10:01 AM  Amy Barnes  has presented today for surgery, with the diagnosis of Nonstemi.  The various methods of treatment have been discussed with the patient and family. After consideration of risks, benefits and other options for treatment, the patient has consented to  Procedure(s): LEFT HEART CATH AND CORONARY ANGIOGRAPHY (N/A) as a surgical intervention.  The patient's history has been reviewed, patient examined, no change in status, stable for surgery.  I have reviewed the patient's chart and labs.  Questions were answered to the patient's satisfaction.     Nicki Guadalajara

## 2019-11-10 NOTE — Progress Notes (Signed)
PROGRESS NOTE  Amy Barnes ZOX:[id number] DOB: [birth year]   PCP: [name], MD  Patient is from: Home.  DOA: 11/08/2019 LOS: 1  Brief Narrative / Interim history: 69 year old female with history of DM-2, HTN, HLD, asthma, emphysema, bipolar disorder, anxiety, depression, anemia, hypothyroidism and alcohol use presented to [facility] long ED with emesis and abdominal pain and admitted for non-STEMI and acute systolic CHF.  In ED, slightly tachycardic.  Slightly hypertensive.  88% on room air requiring supplemental oxygen.  High-sensitivity troponin 106>> 309.  D-dimer 2.09.  EKG with LBBB (chronic).  CTA chest negative for PE but concerning for CHF.  Patient had acute chest pain and shortness of breath on the way back from CT imaging.  Cardiology consulted and started patient on heparin drip.  She will transferred to [facility] for heart catheterization.  Echocardiogram with EF of 35 to 40%, G1 DD, RWMA and severe mitral valve calcification without stenosis. LHC on 2/11 with normal coronaries.   Subjective: No major events overnight or this morning.  Sleepy but awakes to voice easily.  No complaints.  She denies chest pain.  She responds "okay" to her breathing.  She denies nausea, emesis or abdominal pain at this time.  Denies melena or hematochezia.  Objective: Vitals:   11/10/19 1015 11/10/19 1020 11/10/19 1025 11/10/19 1030  BP: (!) 142/45 131/60 (!) 133/54 (!) 127/53  Pulse: 67 68 66 77  Resp: (!) 23 18 18 18   Temp:      TempSrc:      SpO2: 92% 96% 95% 95%  Weight:      Height:        Intake/Output Summary (Last 24 hours) at 11/10/2019 1259 Last data filed at 11/10/2019 0500 Gross per 24 hour  Intake 1057.82 ml  Output 200 ml  Net 857.82 ml   Filed Weights   11/08/19 1829 11/09/19 1636  Weight: (!) 166.8 kg (!) 166.5 kg    Examination:  GENERAL: No acute distress.  Appears well.  HEENT: MMM.  Vision and hearing grossly intact.  NECK: Supple.  No apparent JVD.   RESP:  No IWOB.  Fair aeration bilaterally. CVS:  RRR. Heart sounds normal.  ABD/GI/GU: Bowel sounds present. Soft.  Discomfort to palpation but no tenderness or rebound. MSK/EXT:  Moves extremities. No apparent deformity. No edema.  SKIN: no apparent skin lesion or wound NEURO: Awake, alert and oriented appropriately.  No apparent focal neuro deficit. PSYCH: Calm. Normal affect.  Procedures:  2/11-LHC with normal coronaries.  Assessment & Plan: Acute respiratory failure with hypoxia: Reportedly desaturated to 88%.  Likely due to acute CHF.  She is also at risk for OSA and OHS. -Manage CHF as below. -Breathing treatments for asthma/emphysema. -Wean oxygen as able -Assess ambulatory saturation. -Incentive spirometry  Non-STEMI: suspicious history with some troponin elevation. However, LHC with clean coronaries. -Cardiology managing-on Coreg, aspirin -Change Zocor to Lipitor.  Acute combined CHF: [place] with EF of 35 to 40%, G1DD, RWMA and mitral valve calcification without stenosis.  CTA concerning for pulmonary edema.  BNP and troponin elevated. -Cardiology managing-defer diuretics to cardiology. -GDMT-Coreg -Monitor fluid status, renal function and electrolytes.  Mitral valve calcification: No stenosis. -Per cardiology.  Uncontrolled diabetes with hyperglycemia: A1c 7.2%.  Not on medication at home. Recent Labs    11/10/19 0440 11/10/19 0839 11/10/19 1214  GLUCAP 143* 140* 109*  -Continue current regimen -Would benefit from Jardiance or GLP-1 inhibitors  Essential hypertension: Normotensive for most part.  Not on medications at  home. -Coreg as above  History of anxiety/depression/bipolar disorder: Stable. -Continue home medications.  Chronic asthma/emphysema: Stable. -Continue Dulera, Singulair and as needed nebulizers.  Normocytic anemia: Baseline Hgb 13-14> 14 (admit)> 10.8.  She denies melena or hematochezia. -Continue monitoring -Anemia panel  Hypothyroidism  -Continue home Synthroid  Alcohol use -Encourage cessation -CIWA protocol with Ativan -Continue vitamins.  Abdominal pain/emesis: Due to non-STEMI?  Alcohol?  Seems to have resolved. -As needed antiemetics  Elevated D-dimer: CTA chest and lower extremity Dopplers negative.  Morbid obesity: Body mass index is 63.01 kg/m. -Encourage lifestyle change to lose weight. -Could benefit from GLP-1 inhibitors  At risk for sleep apnea: -Would benefit sleep study.                  DVT prophylaxis: Subcu Lovenox Code Status: Full code Family Communication: Patient and/or RN. Available if any question.   Discharge barrier: Acute CHF and respiratory failure Patient is from: Home Final disposition: Likely home once medically stable and cleared by cardiology  Consultants: Cardiology   Microbiology summarized: COVID-19 negative.  Sch Meds:  Scheduled Meds: . aspirin EC  81 mg Oral Daily  . carvedilol  6.25 mg Oral BID  . enoxaparin (LOVENOX) injection  80 mg Subcutaneous Q24H  . folic acid  1 mg Oral Daily  . insulin aspart  0-9 Units Subcutaneous Q4H  . levothyroxine  150 mcg Oral QHS  . mometasone-formoterol  2 puff Inhalation BID  . montelukast  10 mg Oral QHS  . multivitamin with minerals  1 tablet Oral Daily  . simvastatin  40 mg Oral QPM  . sodium chloride flush  3 mL Intravenous Q12H  . sodium chloride flush  3 mL Intravenous Q12H  . sodium chloride flush  3 mL Intravenous Q12H  . thiamine  100 mg Oral Daily   Or  . thiamine  100 mg Intravenous Daily   Continuous Infusions: . sodium chloride    . sodium chloride 150 mL/hr at 11/10/19 1055  . sodium chloride     PRN Meds:.sodium chloride, sodium chloride, acetaminophen, albuterol, diazepam, hydrALAZINE, labetalol, LORazepam **OR** LORazepam, morphine injection, ondansetron (ZOFRAN) IV, prochlorperazine, sodium chloride flush, sodium chloride flush  Antimicrobials: Anti-infectives (From admission, onward)    None       I have personally reviewed the following labs and images: CBC: Recent Labs  Lab 11/04/19 1535 11/05/19 0510 11/08/19 2057 11/10/19 0353  WBC 11.0* 8.7 8.5 7.7  NEUTROABS 7.6  --  7.3  --   HGB 13.7 13.1 14.1 10.8*  HCT 40.9 40.0 43.0 33.7*  MCV 84.9 86.8 86.5 88.9  PLT 255 205 288 223   BMP &GFR Recent Labs  Lab 11/04/19 1535 11/05/19 0510 11/06/19 1013 11/08/19 2057 11/09/19 0504 11/10/19 0353  NA 130* 126* 135 138  --  136  K 3.6 3.3* 4.1 4.2  --  3.9  CL 91* 93* 102 100  --  98  CO2 27 22 25 26   --  30  GLUCOSE 165* 145* 172* 217*  --  159*  BUN 34* 39* 28* 11  --  11  CREATININE 2.29* 2.26* 0.94 0.56  --  0.63  CALCIUM 9.2 8.4* 8.8* 9.0  --  8.7*  MG 2.1  --   --   --  1.7  --    Estimated Creatinine Clearance: 107.1 mL/min (by C-G formula based on SCr of 0.63 mg/dL). Liver & Pancreas: Recent Labs  Lab 11/04/19 1535 11/05/19 0510 11/08/19 2057  AST  24 22 28   ALT 34 30 32  ALKPHOS 86 76 83  BILITOT 0.9 1.0 0.6  PROT 7.3 6.2* 7.4  ALBUMIN 4.4 3.6 4.2   Recent Labs  Lab 11/04/19 1535 11/08/19 2057  LIPASE 71* 22   No results for input(s): AMMONIA in the last 168 hours. Diabetic: Recent Labs    11/09/19 0504  HGBA1C 7.1*   Recent Labs  Lab 11/09/19 2043 11/10/19 0014 11/10/19 0440 11/10/19 0839 11/10/19 1214  GLUCAP 135* 147* 143* 140* 109*   Cardiac Enzymes: No results for input(s): CKTOTAL, CKMB, CKMBINDEX, TROPONINI in the last 168 hours. No results for input(s): PROBNP in the last 8760 hours. Coagulation Profile: Recent Labs  Lab 11/09/19 0341  INR 1.0   Thyroid Function Tests: Recent Labs    11/09/19 0504  TSH 1.050   Lipid Profile: Recent Labs    11/09/19 0504  CHOL 139  HDL 58  LDLCALC 73  TRIG 41  CHOLHDL 2.4   Anemia Panel: No results for input(s): VITAMINB12, FOLATE, FERRITIN, TIBC, IRON, RETICCTPCT in the last 72 hours. Urine analysis:    Component Value Date/Time   COLORURINE YELLOW  11/08/2019 0300   APPEARANCEUR CLEAR 11/08/2019 0300   APPEARANCEUR Clear 03/04/2019 0948   LABSPEC >1.046 (H) 11/08/2019 0300   LABSPEC 1.017 09/25/2012 2123   PHURINE 6.0 11/08/2019 0300   GLUCOSEU 150 (A) 11/08/2019 0300   GLUCOSEU Negative 09/25/2012 2123   HGBUR NEGATIVE 11/08/2019 0300   HGBUR negative 05/17/2010 0946   BILIRUBINUR NEGATIVE 11/08/2019 0300   BILIRUBINUR Negative 03/04/2019 0948   BILIRUBINUR Negative 09/25/2012 2123   KETONESUR 20 (A) 11/08/2019 0300   PROTEINUR 100 (A) 11/08/2019 0300   UROBILINOGEN 0.2 01/14/2019 0934   UROBILINOGEN 1.0 08/30/2012 1511   NITRITE NEGATIVE 11/08/2019 0300   LEUKOCYTESUR NEGATIVE 11/08/2019 0300   LEUKOCYTESUR Negative 09/25/2012 2123   Sepsis Labs: Invalid input(s): PROCALCITONIN, LACTICIDVEN  Microbiology: Recent Results (from the past 240 hour(s))  SARS Coronavirus 2 Ag (30 min TAT) - Nasal Swab (BD Veritor Kit)     Status: None   Collection Time: 10/31/19  2:00 PM   Specimen: Nasal Swab (BD Veritor Kit)  Result Value Ref Range Status   SARS Coronavirus 2 Ag NEGATIVE NEGATIVE Final    Comment: (NOTE) SARS-CoV-2 antigen NOT DETECTED.  Negative results are presumptive.  Negative results do not preclude SARS-CoV-2 infection and should not be used as the sole basis for treatment or other patient management decisions, including infection  control decisions, particularly in the presence of clinical signs and  symptoms consistent with COVID-19, or in those who have been in contact with the virus.  Negative results must be combined with clinical observations, patient history, and epidemiological information. The expected result is Negative. Fact Sheet for Patients: https://sanders-williams.net/ Fact Sheet for Healthcare Providers: https://martinez.com/ This test is not yet approved or cleared by the [place] FDA and  has been authorized for detection and/or diagnosis of SARS-CoV-2 by  FDA under an Emergency Use Authorization (EUA).  This EUA will remain in effect (meaning this test can be used) for the duration of  the COVID-19 de claration under Section 564(b)(1) of the Act, 21 U.S.C. section 360bbb-3(b)(1), unless the authorization is terminated or revoked sooner. Performed at [facility], [address] [place], [place] [address]   Respiratory Panel by RT PCR (Flu A&B, Covid) - Nasopharyngeal Swab     Status: None   Collection Time: 11/04/19  6:29 PM  Specimen: Nasopharyngeal Swab  Result Value Ref Range Status   SARS Coronavirus 2 by RT PCR NEGATIVE NEGATIVE Final    Comment: (NOTE) SARS-CoV-2 target nucleic acids are NOT DETECTED. The SARS-CoV-2 RNA is generally detectable in upper respiratoy specimens during the acute phase of infection. The lowest concentration of SARS-CoV-2 viral copies this assay can detect is 131 copies/mL. A negative result does not preclude SARS-Cov-2 infection and should not be used as the sole basis for treatment or other patient management decisions. A negative result may occur with  improper specimen collection/handling, submission of specimen other than nasopharyngeal swab, presence of viral mutation(s) within the areas targeted by this assay, and inadequate number of viral copies (<131 copies/mL). A negative result must be combined with clinical observations, patient history, and epidemiological information. The expected result is Negative. Fact Sheet for Patients:  https://www.moore.com/ Fact Sheet for Healthcare Providers:  https://www.young.biz/ This test is not yet ap proved or cleared by the [place] FDA and  has been authorized for detection and/or diagnosis of SARS-CoV-2 by FDA under an Emergency Use Authorization (EUA). This EUA will remain  in effect (meaning this test can be used) for the duration of the COVID-19 declaration under Section 564(b)(1) of the Act,  21 U.S.C. section 360bbb-3(b)(1), unless the authorization is terminated or revoked sooner.    Influenza A by PCR NEGATIVE NEGATIVE Final   Influenza B by PCR NEGATIVE NEGATIVE Final    Comment: (NOTE) The Xpert Xpress SARS-CoV-2/FLU/RSV assay is intended as an aid in  the diagnosis of influenza from Nasopharyngeal swab specimens and  should not be used as a sole basis for treatment. Nasal washings and  aspirates are unacceptable for Xpert Xpress SARS-CoV-2/FLU/RSV  testing. Fact Sheet for Patients: https://www.moore.com/ Fact Sheet for Healthcare Providers: https://www.young.biz/ This test is not yet approved or cleared by the [place] FDA and  has been authorized for detection and/or diagnosis of SARS-CoV-2 by  FDA under an Emergency Use Authorization (EUA). This EUA will remain  in effect (meaning this test can be used) for the duration of the  Covid-19 declaration under Section 564(b)(1) of the Act, 21  U.S.C. section 360bbb-3(b)(1), unless the authorization is  terminated or revoked. Performed at [facility], [address] [place], [place] [address]   Urine culture     Status: Abnormal   Collection Time: 11/05/19 12:04 AM   Specimen: Urine, Random  Result Value Ref Range Status   Specimen Description   Final    URINE, RANDOM Performed at [facility], [address] [place], [place] [address]    Special Requests   Final    NONE Performed at [facility], [address], [place], [place] [address]    Culture MULTIPLE SPECIES PRESENT, SUGGEST RECOLLECTION (A)  Final   Report Status 11/06/2019 FINAL  Final  SARS CORONAVIRUS 2 (TAT 6-24 HRS) Nasopharyngeal Nasopharyngeal Swab     Status: None   Collection Time: 11/09/19  1:49 AM   Specimen: Nasopharyngeal Swab  Result Value Ref Range Status   SARS Coronavirus 2 NEGATIVE NEGATIVE Final    Comment: (NOTE) SARS-CoV-2  target nucleic acids are NOT DETECTED. The SARS-CoV-2 RNA is generally detectable in upper and lower respiratory specimens during the acute phase of infection. Negative results do not preclude SARS-CoV-2 infection, do not rule out co-infections with other pathogens, and should not be used as the sole basis for treatment or other patient management decisions. Negative results must be combined  with clinical observations, patient history, and epidemiological information. The expected result is Negative. Fact Sheet for Patients: HairSlick.no Fact Sheet for Healthcare Providers: quierodirigir.com This test is not yet approved or cleared by the [place] FDA and  has been authorized for detection and/or diagnosis of SARS-CoV-2 by FDA under an Emergency Use Authorization (EUA). This EUA will remain  in effect (meaning this test can be used) for the duration of the COVID-19 declaration under Section 56 4(b)(1) of the Act, 21 U.S.C. section 360bbb-3(b)(1), unless the authorization is terminated or revoked sooner. Performed at [facility], [address], [place], [place] [address]     Radiology Studies: CARDIAC CATHETERIZATION  Result Date: 11/10/2019 Flouroscopy reveals extensive mitral annular calcification. Normal epicardial coronary arteries. LV EDP 23 mmHg RECOMMENDATION: Medical therapy for multiple risk factors.  45 minutes with more than 50% spent in reviewing records, counseling patient/family and coordinating care.   Fe Ok[employer] [facility]  If 7PM-7AM, please contact night-coverage [email or web address] Password [facility] 11/10/2019, 12:59 PM

## 2019-11-10 NOTE — Progress Notes (Signed)
Progress Note  Patient Name: Amy Barnes Date of Encounter: 11/10/2019  Primary Cardiologist:   No primary care provider on file.   Subjective   The patient denies any SOB or chest pain.    Inpatient Medications    Scheduled Meds: . aspirin EC  81 mg Oral Daily  . carvedilol  6.25 mg Oral BID  . enoxaparin (LOVENOX) injection  80 mg Subcutaneous Q24H  . feeding supplement (ENSURE ENLIVE)  237 mL Oral BID BM  . FLUoxetine  40 mg Oral Daily  . folic acid  1 mg Oral Daily  . insulin aspart  0-9 Units Subcutaneous Q4H  . lamoTRIgine  200 mg Oral Daily  . levothyroxine  150 mcg Oral QHS  . mometasone-formoterol  2 puff Inhalation BID  . montelukast  10 mg Oral QHS  . multivitamin with minerals  1 tablet Oral Daily  . OXcarbazepine  300 mg Oral Daily   And  . OXcarbazepine  600 mg Oral QHS  . pantoprazole  40 mg Oral BID  . simvastatin  40 mg Oral QPM  . sodium chloride flush  3 mL Intravenous Q12H  . sodium chloride flush  3 mL Intravenous Q12H  . sodium chloride flush  3 mL Intravenous Q12H  . sucralfate  1 g Oral TID WC & HS  . thiamine  100 mg Oral Daily   Or  . thiamine  100 mg Intravenous Daily   Continuous Infusions: . sodium chloride    . sodium chloride     PRN Meds: sodium chloride, sodium chloride, acetaminophen, albuterol, diazepam, LORazepam **OR** LORazepam, morphine injection, ondansetron (ZOFRAN) IV, prochlorperazine, senna-docusate, sodium chloride flush, sodium chloride flush   Vital Signs    Vitals:   11/10/19 1020 11/10/19 1025 11/10/19 1030 11/10/19 1336  BP: 131/60 (!) 133/54 (!) 127/53 (!) 126/57  Pulse: 68 66 77 70  Resp: Temp:    98.8 F (37.1 C)  TempSrc:    Oral  SpO2: 96% 95% 95% 98%  Weight:      Height:        Intake/Output Summary (Last 24 hours) at 11/10/2019 1739 Last data filed at 11/10/2019 0500 Gross per 24 hour  Intake 1057.82 ml  Output 200 ml  Net 857.82 ml   Filed Weights   11/08/19 1829 11/09/19  1636  Weight: (!) 166.8 kg (!) 166.5 kg    Telemetry    NSR - Personally Reviewed  ECG    NA - Personally Reviewed  Physical Exam   GEN: No acute distress.   Neck: No  JVD Cardiac: RRR, no murmurs, rubs, or gallops.  Respiratory: Clear  to auscultation bilaterally. GI: Soft, nontender, non-distended  MS: No  edema; No deformity.  Right radial without bleeding or bruising.  Neuro:  Nonfocal  Psych: Normal affect   Labs    Chemistry Recent Labs  Lab 11/04/19 1535 11/04/19 1535 11/05/19 0510 11/05/19 0510 11/06/19 1013 11/08/19 2057 11/10/19 0353  NA 130*   < > 126*   < > 135 138 136  K 3.6   < > 3.3*   < > 4.1 4.2 3.9  CL 91*   < > 93*   < > 102 100 98  CO2 27   < > 22   < > GLUCOSE 165*   < > 145*   < > 172* 217* 159*  BUN 34*   < > 39*   < >  28* 11 11  CREATININE 2.29*   < > 2.26*   < > 0.94 0.56 0.63  CALCIUM 9.2   < > 8.4*   < > 8.8* 9.0 8.7*  PROT 7.3  --  6.2*  --   --  7.4  --   ALBUMIN 4.4  --  3.6  --   --  4.2  --   AST 24  --  22  --   --  28  --   ALT 34  --  30  --   --  32  --   ALKPHOS 86  --  76  --   --  83  --   BILITOT 0.9  --  1.0  --   --  0.6  --   GFRNONAA 21*   < > 22*   < > >60 >60 >60  GFRAA 25*   < > 25*   < > >60 >60 >60  ANIONGAP 12   < > 11   < > < > = values in this interval not displayed.     Hematology Recent Labs  Lab 11/05/19 0510 11/08/19 2057 11/10/19 0353  WBC 8.7 8.5 7.7  RBC 4.61 4.97 3.79*  HGB 13.1 14.1 10.8*  HCT 40.0 43.0 33.7*  MCV 86.8 86.5 88.9  MCH 28.4 28.4 28.5  MCHC 32.8 32.8 32.0  RDW 15.7* 16.0* 15.9*  PLT 205 288 223    Cardiac EnzymesNo results for input(s): TROPONINI in the last 168 hours. No results for input(s): TROPIPOC in the last 168 hours.   BNP Recent Labs  Lab 11/09/19 0504  BNP 565.6*     DDimer  Recent Labs  Lab 11/08/19 2307  DDIMER 2.09*     Radiology    CT ABDOMEN PELVIS WO CONTRAST  Result Date: 11/08/2019 CLINICAL DATA:  Nausea, vomiting,  abdominal pain for 2 weeks EXAM: CT ABDOMEN AND PELVIS WITHOUT CONTRAST TECHNIQUE: Multidetector CT imaging of the abdomen and pelvis was performed following the standard protocol without IV contrast. COMPARISON:  10/31/2019 FINDINGS: Lower chest: Left ventricular calcifications are unchanged. No pericardial effusion. Bibasilar interlobular septal thickening and vascular prominence consistent with interstitial edema. Hepatobiliary: No focal liver abnormality is seen. No gallstones, gallbladder wall thickening, or biliary dilatation. Pancreas: Unremarkable. No pancreatic ductal dilatation or surrounding inflammatory changes. Spleen: Normal in size without focal abnormality. Adrenals/Urinary Tract: There is a punctate less than 2 mm nonobstructing calculus right kidney, reference image 44 series 2. No obstructive uropathy. The left kidney is unremarkable. Bladder is grossly normal. The adrenals are unremarkable. Stomach/Bowel: No bowel obstruction or ileus. Normal appendix right lower quadrant. Vascular/Lymphatic: Mild calcified plaque within the distal aorta at the bifurcation. No pathologic adenopathy within the abdomen or pelvis. Reproductive: Status post hysterectomy. No adnexal masses. Other: Small fat containing umbilical hernia unchanged. No free fluid or free gas within the abdomen or pelvis. Musculoskeletal: Left hip arthroplasty is identified. Postsurgical changes lower lumbar spine. No acute or destructive bony lesions. IMPRESSION: 1. Punctate 2 mm nonobstructing right renal calculus. 2. Bibasilar interstitial edema. 3. Stable fat containing umbilical hernia. Electronically Signed   By: Sharlet Salina M.D.   On: 11/08/2019 21:22   CT Angio Chest PE W and/or Wo Contrast  Result Date: 11/09/2019 CLINICAL DATA:  Shortness of breath, elevated troponin and D-dimer EXAM: CT ANGIOGRAPHY CHEST WITH CONTRAST TECHNIQUE: Multidetector CT imaging of the chest was performed using the standard protocol during bolus  administration of  intravenous contrast. Multiplanar CT image reconstructions and MIPs were obtained to evaluate the vascular anatomy. CONTRAST:  OMNIPAQUE IOHEXOL 350 MG/ML SOLN COMPARISON:  CT chest 08/29/2019 FINDINGS: Cardiovascular: Satisfactory opacification the pulmonary arteries to the segmental level. No pulmonary artery filling defects are identified. Central pulmonary arteries are normal caliber. Cardiac size is borderline enlarged. There is dense calcification of mitral annulus. Left ventricular calcification likely reflects sequela of prior infarct. There is lipomatous hypertrophy of the intra-atrial septum. Three-vessel coronary artery calcifications are noted. Atherosclerotic plaque within the normal caliber aorta. Minimal plaque within the normally branching great vessels. Mediastinum/Nodes: Scattered low-attenuation mediastinal and hilar adenopathy with several borderline enlarged lymph nodes including the 12 mm precarinal lymph node (4/40) and a 16 mm subcarinal lymph node (4/50). No mediastinal fluid or gas. No acute abnormality of the trachea or esophagus. Posterior bowing of the trachea likely related to imaging during exhalation for the angiographic technique. Included portions of thyroid gland are unremarkable. Lungs/Pleura: Widespread multifocal areas of mixed ground-glass and consolidative opacity on a background interlobular septal thickening and cephalized vascularity with small bilateral pleural effusions, right greater than left. Atelectatic changes are present in the lungs with some mosaic attenuation which could reflect imaging during exhalation or small airways disease/air trapping. Upper Abdomen: No acute abnormalities present in the visualized portions of the upper abdomen. Musculoskeletal: Multilevel degenerative changes are present in the imaged portions of the spine. No acute osseous abnormality or suspicious osseous lesion. Prior cervical spinal fusion is partially included  in the level of imaging without acute hardware complication. Review of the MIP images confirms the above findings. IMPRESSION: 1. No evidence of acute pulmonary embolism. 2. Widespread multifocal areas of mixed ground-glass and consolidative opacity on a background of interlobular septal thickening and cephalized vascularity with small bilateral pleural effusions, right greater than left. Findings are favored to reflect CHF with pulmonary edema though superimposed infection is not fully excluded. 3. Scattered borderline enlarged mediastinal lymph nodes are likely reactive in etiology. 4. Three-vessel coronary artery calcifications. Left ventricular calcification likely reflects sequela of prior infarct. 5. Lipomatous hypertrophy of the intra-atrial septum. 6. Aortic Atherosclerosis (ICD10-I70.0). Electronically Signed   By: Kreg Shropshire M.D.   On: 11/09/2019 02:14   CARDIAC CATHETERIZATION  Result Date: 11/10/2019 Flouroscopy reveals extensive mitral annular calcification. Normal epicardial coronary arteries. LV EDP 23 mmHg RECOMMENDATION: Medical therapy for multiple risk factors.  VAS [place] LOWER EXTREMITY VENOUS (DVT)  Result Date: 11/10/2019  Lower Venous DVT Study Indications: Elevated Ddimer.  Risk Factors: None identified. Limitations: Body habitus, poor ultrasound/tissue interface and patient positioning, patient pain tolerance. Comparison Study: No prior studies. Performing Technologist: Chanda Busing RVT  Examination Guidelines: A complete evaluation includes B-mode imaging, spectral Doppler, color Doppler, and power Doppler as needed of all accessible portions of each vessel. Bilateral testing is considered an integral part of a complete examination. Limited examinations for reoccurring indications may be performed as noted. The reflux portion of the exam is performed with the patient in reverse Trendelenburg.  +---------+---------------+---------+-----------+----------+--------------+ RIGHT     CompressibilityPhasicitySpontaneityPropertiesThrombus Aging +---------+---------------+---------+-----------+----------+--------------+ CFV      Full           Yes      Yes                                 +---------+---------------+---------+-----------+----------+--------------+ SFJ      Full                                                        +---------+---------------+---------+-----------+----------+--------------+  FV Prox  Full                                                        +---------+---------------+---------+-----------+----------+--------------+ FV Mid   Full                                                        +---------+---------------+---------+-----------+----------+--------------+ FV Distal                                             Not visualized +---------+---------------+---------+-----------+----------+--------------+ POP                                                   Not visualized +---------+---------------+---------+-----------+----------+--------------+ PTV                                                   Not visualized +---------+---------------+---------+-----------+----------+--------------+ PERO                                                  Not visualized +---------+---------------+---------+-----------+----------+--------------+   +---------+---------------+---------+-----------+----------+--------------+ LEFT     CompressibilityPhasicitySpontaneityPropertiesThrombus Aging +---------+---------------+---------+-----------+----------+--------------+ CFV      Full           Yes      Yes                                 +---------+---------------+---------+-----------+----------+--------------+ SFJ      Full                                                        +---------+---------------+---------+-----------+----------+--------------+ FV Prox  Full                                                         +---------+---------------+---------+-----------+----------+--------------+ FV Mid                  Yes      Yes                                 +---------+---------------+---------+-----------+----------+--------------+ FV Distal  Not visualized +---------+---------------+---------+-----------+----------+--------------+ POP      Full           Yes      Yes                                 +---------+---------------+---------+-----------+----------+--------------+ PTV                                                   Not visualized +---------+---------------+---------+-----------+----------+--------------+ PERO                                                  Not visualized +---------+---------------+---------+-----------+----------+--------------+     Summary: RIGHT: - There is no evidence of deep vein thrombosis in the lower extremity. However, portions of this examination were limited- see technologist comments above.  - No cystic structure found in the popliteal fossa.  LEFT: - There is no evidence of deep vein thrombosis in the lower extremity. However, portions of this examination were limited- see technologist comments above.  - No cystic structure found in the popliteal fossa.  *See table(s) above for measurements and observations. Electronically signed by Coral Else MD on 11/10/2019 at 7:29:36 AM.    Final    ECHOCARDIOGRAM LIMITED  Result Date: 11/09/2019    ECHOCARDIOGRAM LIMITED REPORT   Patient Name:   KARTHIKA GLASPER [facility] Date of Exam: 11/09/2019 Medical Rec #:  [id number]     Height:       64.0 in Accession #:    [id number]    Weight:       367.8 lb Date of Birth:  [birth year]      BSA:          2.53 m Patient Age:    68 years      BP:           142/84 mmHg Patient Gender: F             HR:           87 bpm. Exam Location:  Inpatient Procedure: Limited Echo and Intracardiac Opacification Agent Indications:    CHF   History:        Patient has prior history of Echocardiogram examinations, most                 recent 11/05/2019. CAD and Acute MI; Risk Factors:Hypertension,                 Diabetes and Dyslipidemia. Elevated Troponin, Morbid obesity.  Sonographer:    Lavenia Atlas Referring Phys: [id number] VASUNDHRA RATHORE  Sonographer Comments: Patient is morbidly obese. Image acquisition challenging due to patient body habitus. IMPRESSIONS  1. There is akinesis of the apical septal, mid and apical lateral and inferolateral and apical walls. . Left ventricular ejection fraction, by estimation, is 35 to 40%. The left ventricle has moderately decreased function. The left ventrical demonstrates regional wall motion abnormalities. There is mildly increased concentric left ventricular hypertrophy. Left ventricular diastolic parameters are consistent with Grade I diastolic dysfunction (impaired relaxation). Elevated left ventricular end-diastolic pressure.  2. Right ventricular systolic function is normal. The right ventricular size is normal. Tricuspid regurgitation  signal is inadequate for assessing PA pressure.  3. Severe mitral annular calcification of the posterior mitral valve annulus. No evidence of mitral valve regurgitation. No evidence of mitral stenosis.  4. The aortic valve is normal in structure and function. Aortic valve regurgitation is not visualized. No aortic stenosis is present.  5. The inferior vena cava is normal in size with <50% respiratory variability, suggesting right atrial pressure of 8 mmHg. FINDINGS  Left Ventricle: There is akinesis of the apical septal, mid and apical lateral and inferolateral and apical walls. Left ventricular ejection fraction, by estimation, is 35 to 40%. The left ventricle has moderately decreased function. The left ventricle demonstrates regional wall motion abnormalities. The left ventricular internal cavity size was normal in size. There is mildly increased concentric left  ventricular hypertrophy. Elevated left ventricular end-diastolic pressure. Right Ventricle: The right ventricular size is normal. No increase in right ventricular wall thickness. Right ventricular systolic function is normal. Tricuspid regurgitation signal is inadequate for assessing PA pressure. Left Atrium: Left atrial size was normal in size. Right Atrium: Right atrial size was normal in size. Pericardium: There is no evidence of pericardial effusion. Mitral Valve: The mitral valve is normal in structure and function. Normal mobility of the mitral valve leaflets. Severe mitral annular calcification of the posterior mitral valve annulus. No evidence of mitral valve stenosis. Tricuspid Valve: The tricuspid valve is normal in structure. Tricuspid valve regurgitation is not demonstrated. No evidence of tricuspid stenosis. Aortic Valve: The aortic valve is normal in structure and function. Aortic valve regurgitation is not visualized. No aortic stenosis is present. Pulmonic Valve: The pulmonic valve was normal in structure. Pulmonic valve regurgitation is not visualized. No evidence of pulmonic stenosis. Aorta: The aortic root is normal in size and structure. Venous: The inferior vena cava is normal in size with less than 50% respiratory variability, suggesting right atrial pressure of 8 mmHg. The inferior vena cava and the hepatic vein show a normal flow pattern. IAS/Shunts: No atrial level shunt detected by color flow Doppler.  LEFT VENTRICLE PLAX 2D LVIDd:         3.81 cm Diastology LVIDs:         2.57 cm LV e' lateral:   7.07 cm/s LV PW:         1.10 cm LV E/e' lateral: 12.8 LV IVS:        1.26 cm LV e' medial:    5.11 cm/s LV SV Index:   13.43   LV E/e' medial:  17.7  LEFT ATRIUM         Index LA diam:    4.20 cm 1.66 cm/m   AORTA Ao Root diam: 3.20 cm MITRAL VALVE MV Area (PHT): 6.71 cm MV Decel Time: 113 msec MV E velocity: 90.20 cm/s  103 cm/s MV A velocity: 108.00 cm/s 70.3 cm/s MV E/A ratio:  0.84         1.5 Armanda Magic MD Electronically signed by Armanda Magic MD Signature Date/Time: 11/09/2019/1:47:28 PM    Final     Cardiac Studies   Diagnostic Dominance: Right       Patient Profile     69 y.o. female with a hx of morbid obesity, anemia, anxiety, asthma, emphysema, depression, bipolar affective disorder, previously diet controlled DM 2 (hemoglobin A1c 7.2 this admission), hypertension, hyperlipidemia and hypothyroidism who is being seen for the evaluation of elevated troponin at the request of Dr. Loney Loh. She is now noted to have a newly reduced EF.  Assessment & Plan    ACUTE SYSTOLIC HF:  New diagnosis.  Normal coronaries as above.  Her EF is reduced compared with a few weeks ago.  EDP is mildly elevated.  She did not have a right heart cath.  I am not convinced that her nausea vomiting is because of low output heart failure.  However, we will treat with afterload reduction and diuresis. I spoke with her and her daughter in the room again and she is not complaining at home of acute dyspnea and PND.    PROLONGED QTC:  Repeat EKG in the AM.    For questions or updates, please contact [facility] Please consult [email or web address] for contact info under Cardiology/STEMI.   Signed, Rollene Rotunda, MD  11/10/2019, 5:39 PM

## 2019-11-10 NOTE — Progress Notes (Signed)
Initial Nutrition Assessment  DOCUMENTATION CODES:   Morbid obesity  INTERVENTION:   Ensure Enlive po BID, each supplement provides 350 kcal and 20 grams of protein  Continue MVI with minerals daily  Recommend SLP evaluation due to pt complaining of difficulty swallowing chicken.   NUTRITION DIAGNOSIS:   Inadequate oral intake related to nausea as evidenced by per patient/family report.    GOAL:   Patient will meet greater than or equal to 90% of their needs    MONITOR:   PO intake, Supplement acceptance, Weight trends, Labs, I & O's  REASON FOR ASSESSMENT:   Malnutrition Screening Tool    ASSESSMENT:   Pt with a PMH significant for DM2, HTN, HLD, emphysema, bipolar disorder, anemia, hypothyroidism and EtOH use (on CIWA protocol) presented to the ED with abdominal pain and emesis. Pt admitted to [facility] with NSTEMI and acute systolic CHF and transferred to [facility] for heart catheterization.  Discussed pt with RN.   Pt's daughter in room at time of visit. Pt reports intermittent nausea and vomiting for the last 2 weeks with decreased PO intake. Pt states that before the N/V began, she would typically have an omelet for breakfast, meat roll-ups for lunch, and steak for dinner. Pt reports eating a lot of low-carbohydrate food options.  PO intake: 100% x1 recorded meal; however, pt and daughter report the meal documented was just broth, a ginger ale, and an [place] ice. Pt's daughter states pt consumed very little of her tray today. Pt is agreeable to supplements while admitted and reports drinking protein shakes at home when struggling to eat her meals due to nausea.   Of note, pt reports difficulty swallowing chicken. Recommend SLP evaluation.  Pt's daughter reports pt has lost 25 lbs in the last two weeks. Based on wts available in chart, pt weighed 178.7 kg on 1/5 and now weighs 166.5 kg, indicating a 6.6% wt loss in a little over 1 month, which is significant for time frame. Of  note, pt was noted to weigh 168 kg 3 months ago and 8 months ago.   Medications reviewed and include: Folvite, SSI, MVI, thiamine  Labs reviewed. CBGs 109-147  UOP: 230ml x24 hours I/O: +2,019.69ml since admit  NUTRITION - FOCUSED PHYSICAL EXAM:    Most Recent Value  Orbital Region  No depletion  Upper Arm Region  No depletion  Thoracic and Lumbar Region  No depletion  Buccal Region  No depletion  Temple Region  No depletion  Clavicle Bone Region  No depletion  Clavicle and Acromion Bone Region  No depletion  Scapular Bone Region  No depletion  Dorsal Hand  No depletion  Patellar Region  No depletion  Anterior Thigh Region  No depletion  Posterior Calf Region  No depletion  Edema (RD Assessment)  Mild  Hair  Reviewed  Eyes  Reviewed  Mouth  Reviewed  Skin  Reviewed  Nails  Reviewed       Diet Order:   Diet Order            Diet heart healthy/carb modified Room service appropriate? Yes; Fluid consistency: Thin  Diet effective now              EDUCATION NEEDS:   Education needs have been addressed  Skin:  Skin Assessment: Reviewed RN Assessment  Last BM:  2/10  Height:   Ht Readings from Last 1 Encounters:  11/09/19 5\' 4"  (1.626 m)    Weight:   Wt Readings from Last  1 Encounters:  11/09/19 (!) 166.5 kg    BMI:  Body mass index is 63.01 kg/m.  Estimated Nutritional Needs:   Kcal:  2000-2200  Protein:  140-160 grams  Fluid:  >/=2L/d    Eugene Gavia, MS, RD, LDN RD pager number and weekend/on-call pager number located in Amion.

## 2019-11-11 LAB — GLUCOSE, CAPILLARY
Glucose-Capillary: 110 mg/dL — ABNORMAL HIGH (ref 70–99)
Glucose-Capillary: 113 mg/dL — ABNORMAL HIGH (ref 70–99)
Glucose-Capillary: 123 mg/dL — ABNORMAL HIGH (ref 70–99)
Glucose-Capillary: 126 mg/dL — ABNORMAL HIGH (ref 70–99)
Glucose-Capillary: 126 mg/dL — ABNORMAL HIGH (ref 70–99)
Glucose-Capillary: 131 mg/dL — ABNORMAL HIGH (ref 70–99)
Glucose-Capillary: 142 mg/dL — ABNORMAL HIGH (ref 70–99)

## 2019-11-11 LAB — BASIC METABOLIC PANEL
Anion gap: 10 (ref 5–15)
BUN: 12 mg/dL (ref 8–23)
CO2: 31 mmol/L (ref 22–32)
Calcium: 8.9 mg/dL (ref 8.9–10.3)
Chloride: 97 mmol/L — ABNORMAL LOW (ref 98–111)
Creatinine, Ser: 0.68 mg/dL (ref 0.44–1.00)
GFR calc Af Amer: >60 mL/min (ref >60)
GFR calc non Af Amer: >60 mL/min (ref >60)
Glucose, Bld: 164 mg/dL — ABNORMAL HIGH (ref 70–99)
Potassium: 3.9 mmol/L (ref 3.5–5.1)
Sodium: 138 mmol/L (ref 135–145)

## 2019-11-11 LAB — CBC
HCT: 34 % — ABNORMAL LOW (ref 36.0–46.0)
Hemoglobin: 10.6 g/dL — ABNORMAL LOW (ref 12.0–15.0)
MCH: 28.3 pg (ref 26.0–34.0)
MCHC: 31.2 g/dL (ref 30.0–36.0)
MCV: 90.7 fL (ref 80.0–100.0)
Platelets: 216 10*3/uL (ref 150–400)
RBC: 3.75 MIL/uL — ABNORMAL LOW (ref 3.87–5.11)
RDW: 16 % — ABNORMAL HIGH (ref 11.5–15.5)
WBC: 6.3 10*3/uL (ref 4.0–10.5)
nRBC: 0 % (ref 0.0–0.2)

## 2019-11-11 LAB — IRON AND TIBC
Iron: 57 ug/dL (ref 28–170)
Saturation Ratios: 21 % (ref 10.4–31.8)
TIBC: 277 ug/dL (ref 250–450)
UIBC: 220 ug/dL

## 2019-11-11 LAB — BRAIN NATRIURETIC PEPTIDE: B Natriuretic Peptide: 143.6 pg/mL — ABNORMAL HIGH (ref 0.0–100.0)

## 2019-11-11 LAB — VITAMIN B12: Vitamin B-12: 887 pg/mL (ref 180–914)

## 2019-11-11 LAB — OCCULT BLOOD X 1 CARD TO LAB, STOOL: Fecal Occult Bld: NEGATIVE

## 2019-11-11 LAB — FOLATE: Folate: 42.6 ng/mL (ref >5.9)

## 2019-11-11 LAB — RETICULOCYTES
Immature Retic Fract: 12.8 % (ref 2.3–15.9)
RBC.: 4.03 MIL/uL (ref 3.87–5.11)
Retic Count, Absolute: 85 10*3/uL (ref 19.0–186.0)
Retic Ct Pct: 2.1 % (ref 0.4–3.1)

## 2019-11-11 LAB — FERRITIN: Ferritin: 73 ng/mL (ref 11–307)

## 2019-11-11 LAB — MAGNESIUM: Magnesium: 1.9 mg/dL (ref 1.7–2.4)

## 2019-11-11 MED ORDER — FLEET ENEMA 7-19 GM/118ML RE ENEM
1.0000 | ENEMA | RECTAL | Status: DC | PRN
  Administered 2019-11-12: 1 via RECTAL
  Filled 2019-11-11: qty 1

## 2019-11-11 MED ORDER — MAGNESIUM HYDROXIDE 400 MG/5ML PO SUSP
30.0000 mL | Freq: Every day | ORAL | Status: DC | PRN
  Administered 2019-11-11 – 2019-11-12 (×2): 30 mL via ORAL
  Filled 2019-11-11 (×3): qty 30

## 2019-11-11 MED ORDER — POLYETHYLENE GLYCOL 3350 17 G PO PACK
17.0000 g | PACK | Freq: Two times a day (BID) | ORAL | Status: DC | PRN
  Administered 2019-11-12 – 2019-11-14 (×3): 17 g via ORAL
  Filled 2019-11-11 (×3): qty 1

## 2019-11-11 MED ORDER — ENOXAPARIN SODIUM 100 MG/ML ~~LOC~~ SOLN
0.5000 mg/kg | SUBCUTANEOUS | Status: DC
  Administered 2019-11-11 – 2019-11-12 (×2): 85 mg via SUBCUTANEOUS
  Filled 2019-11-11 (×2): qty 1

## 2019-11-11 MED ORDER — ENOXAPARIN SODIUM 40 MG/0.4ML ~~LOC~~ SOLN: 40.0000 mg | SUBCUTANEOUS | Status: DC

## 2019-11-11 MED ORDER — DOXYCYCLINE HYCLATE 100 MG PO TABS
100.0000 mg | ORAL_TABLET | Freq: Every day | ORAL | Status: DC
  Administered 2019-11-12 – 2019-11-14 (×3): 100 mg via ORAL
  Filled 2019-11-11 (×3): qty 1

## 2019-11-11 MED ORDER — DOXYCYCLINE HYCLATE 100 MG PO TABS
100.0000 mg | ORAL_TABLET | Freq: Two times a day (BID) | ORAL | Status: DC
  Administered 2019-11-11: 100 mg via ORAL
  Filled 2019-11-11: qty 1

## 2019-11-11 NOTE — Consult Note (Signed)
Reason for Consult: Nausea, vomiting, and abdominal pain Referring Physician: [facility]  Amy Barnes HPI: This is a 68 year old female with multiple medical problems listed below initially admitted for complaints of abdominal pain and vomiting.  Her symptoms started when she was started on Detrol 2.5 weeks ago.  With the medication she experienced lower abdominal cramping and vomiting.  Stopping the medication for two days resolved the symptoms, but upon rechallenge her symptoms persisted, even with cessation of the medication.  She was recently discharged from the hospital this past weekend for slurred speech and weakness as a result of dehydration.  Her daughter states that he was not able to tolerate any PO.  The slurred speech resolved by the time she was in the ER.  IV hydration resolved her other acute symptoms.  Prior to his hospitalization her PCP increased her baseline pantoprazole to BID and Carafate was added.  Bentyl was also used to help with her abdominal cramping.  These medications as well as compazine did help to improve the nausea/vomiting, but it did not resolve the situation.  After her recent discharge her symptoms, recurred at home and she was brought back to the hospital.  During this admission she started to have more issues with her abdominal pain, but in the ER she complained about having chest pain.  Cardiology evaluated her and identified a new systolic HF, but her current cardiac conditions do not explain her upper GI symptoms.  Cross sectional imaging of the ABM/Pelvis was unrevealing for any acute etiologies.  She is typically followed by GI in [facility] and her last colonoscopy in 2017 for a personal history of polyps.  Past Medical History:  Diagnosis Date   Allergic rhinitis    Ambulates with cane    straight   Anemia    Anxiety    Asthma    seasonal   Bipolar affective disorder (HCC)    takes Synthroid meds for Bipolar   CAD (coronary artery disease) 07/2016    by CT scan   Carpal tunnel syndrome    had surgery but occasional still has some issues per patient   Cataract    Centrilobular emphysema (HCC) 07/2016   by CT scan - pt not aware of this   Constipation due to pain medication    Depression with anxiety    Diabetes mellitus    type 2 - no meds diet controlled   GERD (gastroesophageal reflux disease)    History of blood transfusion    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 - 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 PO   Hyperlipidemia    Hypertension    OSA (obstructive sleep apnea)    no longer using cpap, uses a bed that raises and lowers hob   Osteoarthritis    Osteopenia 08/11/2019   DEXA 07/2019 - T -1.1 R femur (osteopenia)   Pneumonia    Restless legs    Septic arthritis (HCC) 10/11/2012   Shingles 06/30/2016   Status post revision of total hip replacement bilateral   prosthetic infection R 2013, L 2015   Thoracic aortic atherosclerosis (HCC) 11/207   by CT    Past Surgical History:  Procedure Laterality Date   BREAST CYST ASPIRATION     CARPAL TUNNEL RELEASE Right 05/15/2011   CARPAL TUNNEL RELEASE Left 12/24/2017   Procedure: LEFT CARPAL TUNNEL RELEASE;  Surgeon: Kuzma, Kevin, MD;  Location: [facility];  Service: Orthopedics;  Laterality: Left;   CERVICAL FUSION    2012   C2/3/4   COLONOSCOPY WITH PROPOFOL N/A 12/31/2015   diverticulosis, int hem, o/w normal rpt 10 yrs (Rein)   EYE SURGERY Bilateral    cataract surgery with lens implant   FOOT SURGERY  1982   bone spur   I & D EXTREMITY  09/06/2012   Budd Palmer, MD; Right;  I&D of right thigh   I & D EXTREMITY Left 07/2013   wound vac - daily doxycycline indefinitely   KNEE ARTHROSCOPY  09/01/2012   Dannielle Huh, MD;  Right   LEFT HEART CATH AND CORONARY ANGIOGRAPHY N/A 11/10/2019   Procedure: LEFT HEART CATH AND CORONARY ANGIOGRAPHY;  Surgeon: Lennette Bihari, MD;  Location: [facility];  Service: Cardiovascular;  Laterality: N/A;   LUMBAR FUSION   01/08/2012   L3-4   LUMBAR FUSION  02/2019   unexpectedly discovered MRSA infection - pus Lovell Sheehan)    LUMBAR LAMINECTOMY/DECOMPRESSION MICRODISCECTOMY N/A 11/13/2016   LAMINOTOMY/LAMINECTOMY LUMBAR FOUR LUMBAR FIVE  WITH RESECTION OF SYNOVIAL CYST;  Surgeon: Tressie Stalker, MD   NECK SURGERY     Herniated disk C2,3,4   NOSE SURGERY     PARTIAL HYSTERECTOMY  1984   for mennorhagia, ovaries remain   REVISION TOTAL HIP ARTHROPLASTY Left 08/28/2011   TMJ ARTHROPLASTY  1982   TOTAL HIP ARTHROPLASTY Left 2002   TRIGGER FINGER RELEASE Right 05/15/2011   long finger    Family History  Problem Relation Age of Onset   Aneurysm Father 74       brain   Alcohol abuse Father    Cancer Father        possibly   CAD Other        several siblings   Cancer Brother        prostate   Diabetes Brother    Diabetes Sister    Anesthesia problems Neg Hx    Hypotension Neg Hx    Malignant hyperthermia Neg Hx    Pseudochol deficiency Neg Hx    Breast cancer Neg Hx     Social History:  reports that she quit smoking about 10 years ago. Her smoking use included cigarettes. She has a 60.00 pack-year smoking history. She has never used smokeless tobacco. She reports current alcohol use of about 21.0 standard drinks of alcohol per week. She reports that she does not use drugs.  Allergies:  Allergies  Allergen Reactions   Cephalexin Hives   Hydrocodone Other (See Comments)    Reaction:  Hallucinations    Tolterodine Tartrate Nausea And Vomiting   Risperidone And Related Other (See Comments)    Reaction:  Made pt excessively sleepy   Seroquel [Quetiapine Fumarate] Other (See Comments)    Reaction:  Made pt excessively sleepy   Sulfa Antibiotics Rash    Medications:  Scheduled:  carvedilol  6.25 mg Oral BID   [START ON 11/12/2019] doxycycline  100 mg Oral Daily   enoxaparin (LOVENOX) injection  0.5 mg/kg Subcutaneous Q24H   feeding supplement (ENSURE ENLIVE)  237 mL Oral BID BM   FLUoxetine   40 mg Oral Daily   folic acid  1 mg Oral Daily   insulin aspart  0-9 Units Subcutaneous Q4H   lamoTRIgine  200 mg Oral Daily   levothyroxine  150 mcg Oral QHS   losartan  25 mg Oral BID   mometasone-formoterol  2 puff Inhalation BID   montelukast  10 mg Oral QHS   multivitamin with minerals  1 tablet Oral  Daily   OXcarbazepine  300 mg Oral Daily   And   OXcarbazepine  600 mg Oral QHS   pantoprazole  40 mg Oral BID   simvastatin  40 mg Oral QPM   sodium chloride flush  3 mL Intravenous Q12H   sodium chloride flush  3 mL Intravenous Q12H   sodium chloride flush  3 mL Intravenous Q12H   sucralfate  1 g Oral TID WC & HS   thiamine  100 mg Oral Daily   Or   thiamine  100 mg Intravenous Daily   Continuous:  sodium chloride     sodium chloride      Results for orders placed or performed during the hospital encounter of 11/08/19 (from the past 24 hour(s))  Glucose, capillary     Status: Abnormal   Collection Time: 11/10/19  9:34 PM  Result Value Ref Range   Glucose-Capillary 124 (H) 70 - 99 mg/dL   Comment 1 Notify RN    Comment 2 Document in Chart   Glucose, capillary     Status: Abnormal   Collection Time: 11/11/19  1:48 AM  Result Value Ref Range   Glucose-Capillary 126 (H) 70 - 99 mg/dL  CBC     Status: Abnormal   Collection Time: 11/11/19  3:21 AM  Result Value Ref Range   WBC 6.3 4.0 - 10.5 K/uL   RBC 3.75 (L) 3.87 - 5.11 MIL/uL   Hemoglobin 10.6 (L) 12.0 - 15.0 g/dL   HCT 86.5 (L) 78.4 - 69.6 %   MCV 90.7 80.0 - 100.0 fL   MCH 28.3 26.0 - 34.0 pg   MCHC 31.2 30.0 - 36.0 g/dL   RDW 29.5 (H) 28.4 - 13.2 %   Platelets 216 150 - 400 K/uL   nRBC 0.0 0.0 - 0.2 %  Basic metabolic panel     Status: Abnormal   Collection Time: 11/11/19  3:21 AM  Result Value Ref Range   Sodium 138 135 - 145 mmol/L   Potassium 3.9 3.5 - 5.1 mmol/L   Chloride 97 (L) 98 - 111 mmol/L   CO2 31 22 - 32 mmol/L   Glucose, Bld 164 (H) 70 - 99 mg/dL   BUN 12 8 - 23 mg/dL   Creatinine, Ser 4.40  0.44 - 1.00 mg/dL   Calcium 8.9 8.9 - 10.2 mg/dL   GFR calc non Af Amer >60 >60 mL/min   GFR calc Af Amer >60 >60 mL/min   Anion gap 10 5 - 15  Brain natriuretic peptide     Status: Abnormal   Collection Time: 11/11/19  3:21 AM  Result Value Ref Range   B Natriuretic Peptide 143.6 (H) 0.0 - 100.0 pg/mL  Magnesium     Status: None   Collection Time: 11/11/19  3:21 AM  Result Value Ref Range   Magnesium 1.9 1.7 - 2.4 mg/dL  Glucose, capillary     Status: Abnormal   Collection Time: 11/11/19  4:03 AM  Result Value Ref Range   Glucose-Capillary 131 (H) 70 - 99 mg/dL   Comment 1 Notify RN    Comment 2 Document in Chart   Glucose, capillary     Status: Abnormal   Collection Time: 11/11/19  8:50 AM  Result Value Ref Range   Glucose-Capillary 123 (H) 70 - 99 mg/dL   Comment 1 Notify RN    Comment 2 Document in Chart   Glucose, capillary     Status: Abnormal   Collection Time: 11/11/19  11:25 AM  Result Value Ref Range   Glucose-Capillary 113 (H) 70 - 99 mg/dL   Comment 1 Notify RN    Comment 2 Document in Chart   Occult blood card to lab, stool RN will collect     Status: None   Collection Time: 11/11/19 11:57 AM  Result Value Ref Range   Fecal Occult Bld NEGATIVE NEGATIVE  Reticulocytes     Status: None   Collection Time: 11/11/19  3:10 PM  Result Value Ref Range   Retic Ct Pct 2.1 0.4 - 3.1 %   RBC. 4.03 3.87 - 5.11 MIL/uL   Retic Count, Absolute 85.0 19.0 - 186.0 K/uL   Immature Retic Fract 12.8 2.3 - 15.9 %  Glucose, capillary     Status: Abnormal   Collection Time: 11/11/19  4:08 PM  Result Value Ref Range   Glucose-Capillary 110 (H) 70 - 99 mg/dL   Comment 1 Notify RN    Comment 2 Document in Chart      CARDIAC CATHETERIZATION  Result Date: 11/10/2019 Flouroscopy reveals extensive mitral annular calcification. Normal epicardial coronary arteries. LV EDP 23 mmHg RECOMMENDATION: Medical therapy for multiple risk factors.   ROS:  As stated above in the HPI otherwise  negative.  Blood pressure (!) 123/54, pulse 67, temperature 98.3 F (36.8 C), temperature source Oral, resp. rate (!) 22, height 5\' 4"  (1.626 m), weight (!) 169.3 kg, SpO2 98 %.    PE: Gen: NAD, Alert and Oriented HEENT:  [place]/AT, EOMI Neck: Supple, no LAD Lungs: CTA Bilaterally CV: RRR without M/G/R ABM: Soft, tender in the upper abdomen, +BS Ext: No C/C/E  Assessment/Plan: 1) Epigastric pain. 2) Nausea/vomiting. 3) New systolic HF.       She will benefit with further evaluation with an EGD.  She is stable at this time.  Further recommendations will be made pending the findings.  Plan: 1) EGD with Dr. Loletha Carrow tomorrow. Cameron Schwinn D 11/11/2019, 4:56 PM

## 2019-11-11 NOTE — H&P (View-Only) (Signed)
Reason for Consult: Nausea, vomiting, and abdominal pain Referring Physician: [facility]  Amy Barnes HPI: This is a 69 year old female with multiple medical problems listed below initially admitted for complaints of abdominal pain and vomiting.  Her symptoms started when she was started on Detrol 2.5 weeks ago.  With the medication she experienced lower abdominal cramping and vomiting.  Stopping the medication for two days resolved the symptoms, but upon rechallenge her symptoms persisted, even with cessation of the medication.  She was recently discharged from the hospital this past weekend for slurred speech and weakness as a result of dehydration.  Her daughter states that he was not able to tolerate any PO.  The slurred speech resolved by the time she was in the ER.  IV hydration resolved her other acute symptoms.  Prior to his hospitalization her PCP increased her baseline pantoprazole to BID and Carafate was added.  Bentyl was also used to help with her abdominal cramping.  These medications as well as compazine did help to improve the nausea/vomiting, but it did not resolve the situation.  After her recent discharge her symptoms, recurred at home and she was brought back to the hospital.  During this admission she started to have more issues with her abdominal pain, but in the ER she complained about having chest pain.  Cardiology evaluated her and identified a new systolic HF, but her current cardiac conditions do not explain her upper GI symptoms.  Cross sectional imaging of the ABM/Pelvis was unrevealing for any acute etiologies.  She is typically followed by GI in [facility] and her last colonoscopy in 2017 for a personal history of polyps.  Past Medical History:  Diagnosis Date   Allergic rhinitis    Ambulates with cane    straight   Anemia    Anxiety    Asthma    seasonal   Bipolar affective disorder (HCC)    takes Synthroid meds for Bipolar   CAD (coronary artery disease) 07/2016    by CT scan   Carpal tunnel syndrome    had surgery but occasional still has some issues per patient   Cataract    Centrilobular emphysema (HCC) 07/2016   by CT scan - pt not aware of this   Constipation due to pain medication    Depression with anxiety    Diabetes mellitus    type 2 - no meds diet controlled   GERD (gastroesophageal reflux disease)    History of blood transfusion    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 - 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 PO   Hyperlipidemia    Hypertension    OSA (obstructive sleep apnea)    no longer using cpap, uses a bed that raises and lowers hob   Osteoarthritis    Osteopenia 08/11/2019   DEXA 07/2019 - T -1.1 R femur (osteopenia)   Pneumonia    Restless legs    Septic arthritis (HCC) 10/11/2012   Shingles 06/30/2016   Status post revision of total hip replacement bilateral   prosthetic infection R 2013, L 2015   Thoracic aortic atherosclerosis (HCC) 11/207   by CT    Past Surgical History:  Procedure Laterality Date   BREAST CYST ASPIRATION     CARPAL TUNNEL RELEASE Right 05/15/2011   CARPAL TUNNEL RELEASE Left 12/24/2017   Procedure: LEFT CARPAL TUNNEL RELEASE;  Surgeon: [name], MD;  [facility];  Service: Orthopedics;  Laterality: Left;   CERVICAL FUSION  2012   C2/3/4   COLONOSCOPY WITH PROPOFOL N/A 12/31/2015   diverticulosis, int hem, o/w normal rpt 10 yrs (Rein)   EYE SURGERY Bilateral    cataract surgery with lens implant   FOOT SURGERY  1982   bone spur   I & D EXTREMITY  09/06/2012   Budd Palmer, MD; Right;  I&D of right thigh   I & D EXTREMITY Left 07/2013   wound vac - daily doxycycline indefinitely   KNEE ARTHROSCOPY  09/01/2012   Dannielle Huh, MD;  Right   LEFT HEART CATH AND CORONARY ANGIOGRAPHY N/A 11/10/2019   Procedure: LEFT HEART CATH AND CORONARY ANGIOGRAPHY;  Surgeon: Lennette Bihari, MD;  Location: [facility];  Service: Cardiovascular;  Laterality: N/A;   LUMBAR FUSION   01/08/2012   L3-4   LUMBAR FUSION  02/2019   unexpectedly discovered MRSA infection - pus Lovell Sheehan)    LUMBAR LAMINECTOMY/DECOMPRESSION MICRODISCECTOMY N/A 11/13/2016   LAMINOTOMY/LAMINECTOMY LUMBAR FOUR LUMBAR FIVE  WITH RESECTION OF SYNOVIAL CYST;  Surgeon: Tressie Stalker, MD   NECK SURGERY     Herniated disk C2,3,4   NOSE SURGERY     PARTIAL HYSTERECTOMY  1984   for mennorhagia, ovaries remain   REVISION TOTAL HIP ARTHROPLASTY Left 08/28/2011   TMJ ARTHROPLASTY  1982   TOTAL HIP ARTHROPLASTY Left 2002   TRIGGER FINGER RELEASE Right 05/15/2011   long finger    Family History  Problem Relation Age of Onset   Aneurysm Father 74       brain   Alcohol abuse Father    Cancer Father        possibly   CAD Other        several siblings   Cancer Brother        prostate   Diabetes Brother    Diabetes Sister    Anesthesia problems Neg Hx    Hypotension Neg Hx    Malignant hyperthermia Neg Hx    Pseudochol deficiency Neg Hx    Breast cancer Neg Hx     Social History:  reports that she quit smoking about 10 years ago. Her smoking use included cigarettes. She has a 60.00 pack-year smoking history. She has never used smokeless tobacco. She reports current alcohol use of about 21.0 standard drinks of alcohol per week. She reports that she does not use drugs.  Allergies:  Allergies  Allergen Reactions   Cephalexin Hives   Hydrocodone Other (See Comments)    Reaction:  Hallucinations    Tolterodine Tartrate Nausea And Vomiting   Risperidone And Related Other (See Comments)    Reaction:  Made pt excessively sleepy   Seroquel [Quetiapine Fumarate] Other (See Comments)    Reaction:  Made pt excessively sleepy   Sulfa Antibiotics Rash    Medications:  Scheduled:  carvedilol  6.25 mg Oral BID   [START ON 11/12/2019] doxycycline  100 mg Oral Daily   enoxaparin (LOVENOX) injection  0.5 mg/kg Subcutaneous Q24H   feeding supplement (ENSURE ENLIVE)  237 mL Oral BID BM   FLUoxetine   40 mg Oral Daily   folic acid  1 mg Oral Daily   insulin aspart  0-9 Units Subcutaneous Q4H   lamoTRIgine  200 mg Oral Daily   levothyroxine  150 mcg Oral QHS   losartan  25 mg Oral BID   mometasone-formoterol  2 puff Inhalation BID   montelukast  10 mg Oral QHS   multivitamin with minerals  1 tablet Oral  Daily   OXcarbazepine  300 mg Oral Daily   And   OXcarbazepine  600 mg Oral QHS   pantoprazole  40 mg Oral BID   simvastatin  40 mg Oral QPM   sodium chloride flush  3 mL Intravenous Q12H   sodium chloride flush  3 mL Intravenous Q12H   sodium chloride flush  3 mL Intravenous Q12H   sucralfate  1 g Oral TID WC & HS   thiamine  100 mg Oral Daily   Or   thiamine  100 mg Intravenous Daily   Continuous:  sodium chloride     sodium chloride      Results for orders placed or performed during the hospital encounter of 11/08/19 (from the past 24 hour(s))  Glucose, capillary     Status: Abnormal   Collection Time: 11/10/19  9:34 PM  Result Value Ref Range   Glucose-Capillary 124 (H) 70 - 99 mg/dL   Comment 1 Notify RN    Comment 2 Document in Chart   Glucose, capillary     Status: Abnormal   Collection Time: 11/11/19  1:48 AM  Result Value Ref Range   Glucose-Capillary 126 (H) 70 - 99 mg/dL  CBC     Status: Abnormal   Collection Time: 11/11/19  3:21 AM  Result Value Ref Range   WBC 6.3 4.0 - 10.5 K/uL   RBC 3.75 (L) 3.87 - 5.11 MIL/uL   Hemoglobin 10.6 (L) 12.0 - 15.0 g/dL   HCT 86.5 (L) 78.4 - 69.6 %   MCV 90.7 80.0 - 100.0 fL   MCH 28.3 26.0 - 34.0 pg   MCHC 31.2 30.0 - 36.0 g/dL   RDW 29.5 (H) 28.4 - 13.2 %   Platelets 216 150 - 400 K/uL   nRBC 0.0 0.0 - 0.2 %  Basic metabolic panel     Status: Abnormal   Collection Time: 11/11/19  3:21 AM  Result Value Ref Range   Sodium 138 135 - 145 mmol/L   Potassium 3.9 3.5 - 5.1 mmol/L   Chloride 97 (L) 98 - 111 mmol/L   CO2 31 22 - 32 mmol/L   Glucose, Bld 164 (H) 70 - 99 mg/dL   BUN 12 8 - 23 mg/dL   Creatinine, Ser 4.40  0.44 - 1.00 mg/dL   Calcium 8.9 8.9 - 10.2 mg/dL   GFR calc non Af Amer >60 >60 mL/min   GFR calc Af Amer >60 >60 mL/min   Anion gap 10 5 - 15  Brain natriuretic peptide     Status: Abnormal   Collection Time: 11/11/19  3:21 AM  Result Value Ref Range   B Natriuretic Peptide 143.6 (H) 0.0 - 100.0 pg/mL  Magnesium     Status: None   Collection Time: 11/11/19  3:21 AM  Result Value Ref Range   Magnesium 1.9 1.7 - 2.4 mg/dL  Glucose, capillary     Status: Abnormal   Collection Time: 11/11/19  4:03 AM  Result Value Ref Range   Glucose-Capillary 131 (H) 70 - 99 mg/dL   Comment 1 Notify RN    Comment 2 Document in Chart   Glucose, capillary     Status: Abnormal   Collection Time: 11/11/19  8:50 AM  Result Value Ref Range   Glucose-Capillary 123 (H) 70 - 99 mg/dL   Comment 1 Notify RN    Comment 2 Document in Chart   Glucose, capillary     Status: Abnormal   Collection Time: 11/11/19  11:25 AM  Result Value Ref Range   Glucose-Capillary 113 (H) 70 - 99 mg/dL   Comment 1 Notify RN    Comment 2 Document in Chart   Occult blood card to lab, stool RN will collect     Status: None   Collection Time: 11/11/19 11:57 AM  Result Value Ref Range   Fecal Occult Bld NEGATIVE NEGATIVE  Reticulocytes     Status: None   Collection Time: 11/11/19  3:10 PM  Result Value Ref Range   Retic Ct Pct 2.1 0.4 - 3.1 %   RBC. 4.03 3.87 - 5.11 MIL/uL   Retic Count, Absolute 85.0 19.0 - 186.0 K/uL   Immature Retic Fract 12.8 2.3 - 15.9 %  Glucose, capillary     Status: Abnormal   Collection Time: 11/11/19  4:08 PM  Result Value Ref Range   Glucose-Capillary 110 (H) 70 - 99 mg/dL   Comment 1 Notify RN    Comment 2 Document in Chart      CARDIAC CATHETERIZATION  Result Date: 11/10/2019 Flouroscopy reveals extensive mitral annular calcification. Normal epicardial coronary arteries. LV EDP 23 mmHg RECOMMENDATION: Medical therapy for multiple risk factors.   ROS:  As stated above in the HPI otherwise  negative.  Blood pressure (!) 123/54, pulse 67, temperature 98.3 F (36.8 C), temperature source Oral, resp. rate (!) 22, height 5\' 4"  (1.626 m), weight (!) 169.3 kg, SpO2 98 %.    PE: Gen: NAD, Alert and Oriented HEENT:  [place]/AT, EOMI Neck: Supple, no LAD Lungs: CTA Bilaterally CV: RRR without M/G/R ABM: Soft, tender in the upper abdomen, +BS Ext: No C/C/E  Assessment/Plan: 1) Epigastric pain. 2) Nausea/vomiting. 3) New systolic HF.       She will benefit with further evaluation with an EGD.  She is stable at this time.  Further recommendations will be made pending the findings.  Plan: 1) EGD with Dr. Loletha Carrow tomorrow. Angelina Venard D 11/11/2019, 4:56 PM

## 2019-11-11 NOTE — Progress Notes (Signed)
PROGRESS NOTE  Amy Barnes:[id number] DOB: 1950-12-31   PCP: [name], MD  Patient is from: Home.  DOA: 11/08/2019 LOS: 2  Brief Narrative / Interim history: 69 year old female with history of DM-2, HTN, HLD, asthma, emphysema, bipolar disorder, anxiety, depression, anemia, hypothyroidism and alcohol use presented to [facility] long ED with emesis and abdominal pain and admitted for non-STEMI and acute systolic CHF.  In ED, slightly tachycardic.  Slightly hypertensive.  88% on room air requiring supplemental oxygen.  High-sensitivity troponin 106>> 309.  D-dimer 2.09.  EKG with LBBB (chronic).  CTA chest negative for PE but concerning for CHF.  Patient had acute chest pain and shortness of breath on the way back from CT imaging.  Cardiology consulted and started patient on heparin drip.  She will transferred to [facility] for heart catheterization.  Echocardiogram with EF of 35 to 40%, G1 DD, RWMA and severe mitral valve calcification without stenosis. LHC on 2/11 with normal coronaries.   Subjective: No major events overnight or this morning.  Feels well this morning.  No further nausea or emesis.  Abdominal pain improved.  She denies abdominal pain or dyspnea.  Patient's daughter concerned about these recurrent nausea, vomiting and and drop in her hemoglobin.  Objective: Vitals:   11/11/19 0440 11/11/19 0500 11/11/19 0724 11/11/19 0856  BP:    (!) 123/54  Pulse: 68   67  Resp: 15   (!) 22  Temp:    98.3 F (36.8 C)  TempSrc:    Oral  SpO2:   97% 98%  Weight:  (!) 169.3 kg    Height:        Intake/Output Summary (Last 24 hours) at 11/11/2019 1348 Last data filed at 11/11/2019 0156 Gross per 24 hour  Intake 180 ml  Output 500 ml  Net -320 ml   Filed Weights   11/08/19 1829 11/09/19 1636 11/11/19 0500  Weight: (!) 166.8 kg (!) 166.5 kg (!) 169.3 kg    Examination:  GENERAL: No acute distress.  Appears well.  HEENT: MMM.  Vision and hearing grossly intact.  NECK:  Supple.  No apparent JVD.  RESP:  No IWOB. Good air movement bilaterally. CVS:  RRR. Heart sounds normal.  ABD/GI/GU: Bowel sounds present. Soft.  Mild discomfort with palpation but no tenderness. MSK/EXT:  Moves extremities. No apparent deformity. No edema.  SKIN: no apparent skin lesion or wound NEURO: Awake, alert and oriented appropriately.  No apparent focal neuro deficit. PSYCH: Calm. Normal affect.  Procedures:  2/11-LHC with normal coronaries.  Assessment & Plan: Acute respiratory failure with hypoxia: reportedly desaturated to 88%.  Likely due to acute CHF.  She is also at risk for OSA and OHS. -Manage CHF as below. -Breathing treatments for asthma/emphysema. -Wean oxygen as able.  Minimal oxygen to maintain saturation above 90%.  Ambulatory saturation assessment. -Incentive spirometry  Non-STEMI/chronic LBP: suspicious history with some troponin elevation. However, LHC with clean coronaries. -Cardiology managing-on Coreg, aspirin -Continue Lipitor  Acute combined CHF: Echo with EF of 35 to 40% (60 to 65% 4 days prior), G1DD, RWMA and mitral valve calcification without stenosis.  CTA concerning for pulmonary edema.  BNP 566> 144.  Troponin elevated as well.  Heart 500 cc +1 unmeasured voids.  Not on diuretics.  Difficult to assess fluid status due to body habitus. -Cardiology managing-will clarify diuretic plan. -GDMT-Coreg, losartan -Monitor fluid status, renal function and electrolytes.  Mitral valve calcification: No stenosis. -Per cardiology.  Recurrent nausea/vomiting/abdominal pain: potential causes  include non-STEMI, gastritis/gastric ulcer, EtOH, iatrogenic and psychogenic.  Although GI symptoms seems to have resolved after PPI and Carafate, patient's daughter very concerned because she had to come to ED multiple times for this issue.  -As needed antiemetics -GI consulted.  Normocytic anemia: Baseline Hgb 13-14> 14 (admit)> 10.8> 10.6.  She reported melena this  morning.  Denies hematochezia.  FOBT negative. -Continue monitoring -Anemia panel -GI consulted.  Uncontrolled diabetes with hyperglycemia: A1c 7.2%.  Not on medication at home. Recent Labs    11/11/19 0403 11/11/19 0850 11/11/19 1125  GLUCAP 131* 123* 113*  -Continue current regimen -Would benefit from Jardiance or GLP-1 inhibitors  Essential hypertension: Normotensive for most part.  Not on medications at home. -Cardiac meds as above.  History of anxiety/depression/bipolar disorder: Stable. -Continue home medications.  Chronic asthma/emphysema: Stable. -Continue Dulera, Singulair and as needed nebulizers.  Prolonged QTC: Exaggerated due to LBBB.  Hypothyroidism -Continue home Synthroid  Alcohol use: Admits drinking 2 shots nightly daily. -Encourage cessation -CIWA protocol with Ativan -Continue vitamins.  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  On doxycycline 100 mg daily. -Continue home doxycycline 100 mg daily. -Has upcoming ID follow-up in March.  Elevated D-dimer: CTA chest and lower extremity Dopplers negative.  Morbid obesity: Body mass index is 64.07 kg/m. -Encourage lifestyle change to lose weight. -Could benefit from GLP-1 inhibitors  At risk for sleep apnea: -Would benefit sleep study.        Nutrition Problem: Inadequate oral intake Etiology: nausea  Signs/Symptoms: per patient/family report  Interventions: Ensure Enlive (each supplement provides 350kcal and 20 grams of protein), MVI   DVT prophylaxis: Subcu Lovenox  Code Status: Full code Family Communication: Updated patient's daughter over the phone.  Discharge barrier: Acute CHF and respiratory failure Patient is from: Home Final disposition: Likely home once medically stable and cleared by cardiology  Consultants: Cardiology   Microbiology summarized: COVID-19 negative.  Sch Meds:  Scheduled Meds: . carvedilol  6.25 mg Oral BID  . doxycycline  100 mg  Oral Q12H  . feeding supplement (ENSURE ENLIVE)  237 mL Oral BID BM  . FLUoxetine  40 mg Oral Daily  . folic acid  1 mg Oral Daily  . insulin aspart  0-9 Units Subcutaneous Q4H  . lamoTRIgine  200 mg Oral Daily  . levothyroxine  150 mcg Oral QHS  . losartan  25 mg Oral BID  . mometasone-formoterol  2 puff Inhalation BID  . montelukast  10 mg Oral QHS  . multivitamin with minerals  1 tablet Oral Daily  . OXcarbazepine  300 mg Oral Daily   And  . OXcarbazepine  600 mg Oral QHS  . pantoprazole  40 mg Oral BID  . simvastatin  40 mg Oral QPM  . sodium chloride flush  3 mL Intravenous Q12H  . sodium chloride flush  3 mL Intravenous Q12H  . sodium chloride flush  3 mL Intravenous Q12H  . sucralfate  1 g Oral TID WC & HS  . thiamine  100 mg Oral Daily   Or  . thiamine  100 mg Intravenous Daily   Continuous Infusions: . sodium chloride    . sodium chloride     PRN Meds:.sodium chloride, sodium chloride, acetaminophen, albuterol, diazepam, LORazepam **OR** LORazepam, magnesium hydroxide, morphine injection, ondansetron (ZOFRAN) IV, prochlorperazine, senna-docusate, sodium chloride flush, sodium chloride flush  Antimicrobials: Anti-infectives (From admission, onward)   Start     Dose/Rate Route Frequency Ordered Stop   11/11/19 1400  doxycycline (VIBRA-TABS)  tablet 100 mg     100 mg Oral Every 12 hours 11/11/19 1222         I have personally reviewed the following labs and images: CBC: Recent Labs  Lab 11/04/19 1535 11/05/19 0510 11/08/19 2057 11/10/19 0353 11/11/19 0321  WBC 11.0* 8.7 8.5 7.7 6.3  NEUTROABS 7.6  --  7.3  --   --   HGB 13.7 13.1 14.1 10.8* 10.6*  HCT 40.9 40.0 43.0 33.7* 34.0*  MCV 84.9 86.8 86.5 88.9 90.7  PLT 255 205 288 223 216   BMP &GFR Recent Labs  Lab 11/04/19 1535 11/04/19 1535 11/05/19 0510 11/06/19 1013 11/08/19 2057 11/09/19 0504 11/10/19 0353 11/11/19 0321  NA 130*   < > 126* 135 138  --  136 138  K 3.6   < > 3.3* 4.1 4.2  --  3.9  3.9  CL 91*   < > 93* 102 100  --  98 97*  CO2 27   < > 22 25 26   --  30 31  GLUCOSE 165*   < > 145* 172* 217*  --  159* 164*  BUN 34*   < > 39* 28* 11  --  11 12  CREATININE 2.29*   < > 2.26* 0.94 0.56  --  0.63 0.68  CALCIUM 9.2   < > 8.4* 8.8* 9.0  --  8.7* 8.9  MG 2.1  --   --   --   --  1.7  --  1.9   < > = values in this interval not displayed.   Estimated Creatinine Clearance: 108.3 mL/min (by C-G formula based on SCr of 0.68 mg/dL). Liver & Pancreas: Recent Labs  Lab 11/04/19 1535 11/05/19 0510 11/08/19 2057  AST 24 22 28   ALT 34 30 32  ALKPHOS 86 76 83  BILITOT 0.9 1.0 0.6  PROT 7.3 6.2* 7.4  ALBUMIN 4.4 3.6 4.2   Recent Labs  Lab 11/04/19 1535 11/08/19 2057  LIPASE 71* 22   No results for input(s): AMMONIA in the last 168 hours. Diabetic: Recent Labs    11/09/19 0504  HGBA1C 7.1*   Recent Labs  Lab 11/10/19 2134 11/11/19 0148 11/11/19 0403 11/11/19 0850 11/11/19 1125  GLUCAP 124* 126* 131* 123* 113*   Cardiac Enzymes: No results for input(s): CKTOTAL, CKMB, CKMBINDEX, TROPONINI in the last 168 hours. No results for input(s): PROBNP in the last 8760 hours. Coagulation Profile: Recent Labs  Lab 11/09/19 0341  INR 1.0   Thyroid Function Tests: Recent Labs    11/09/19 0504  TSH 1.050   Lipid Profile: Recent Labs    11/09/19 0504  CHOL 139  HDL 58  LDLCALC 73  TRIG 41  CHOLHDL 2.4   Anemia Panel: No results for input(s): VITAMINB12, FOLATE, FERRITIN, TIBC, IRON, RETICCTPCT in the last 72 hours. Urine analysis:    Component Value Date/Time   COLORURINE YELLOW 11/08/2019 0300   APPEARANCEUR CLEAR 11/08/2019 0300   APPEARANCEUR Clear 03/04/2019 0948   LABSPEC >1.046 (H) 11/08/2019 0300   LABSPEC 1.017 09/25/2012 2123   PHURINE 6.0 11/08/2019 0300   GLUCOSEU 150 (A) 11/08/2019 0300   GLUCOSEU Negative 09/25/2012 2123   HGBUR NEGATIVE 11/08/2019 0300   HGBUR negative 05/17/2010 0946   BILIRUBINUR NEGATIVE 11/08/2019 0300    BILIRUBINUR Negative 03/04/2019 0948   BILIRUBINUR Negative 09/25/2012 2123   KETONESUR 20 (A) 11/08/2019 0300   PROTEINUR 100 (A) 11/08/2019 0300   UROBILINOGEN 0.2 01/14/2019 0934   UROBILINOGEN  1.0 08/30/2012 1511   NITRITE NEGATIVE 11/08/2019 0300   LEUKOCYTESUR NEGATIVE 11/08/2019 0300   LEUKOCYTESUR Negative 09/25/2012 2123   Sepsis Labs: Invalid input(s): PROCALCITONIN, LACTICIDVEN  Microbiology: Recent Results (from the past 240 hour(s))  Respiratory Panel by RT PCR (Flu A&B, Covid) - Nasopharyngeal Swab     Status: None   Collection Time: 11/04/19  6:29 PM   Specimen: Nasopharyngeal Swab  Result Value Ref Range Status   SARS Coronavirus 2 by RT PCR NEGATIVE NEGATIVE Final    Comment: (NOTE) SARS-CoV-2 target nucleic acids are NOT DETECTED. The SARS-CoV-2 RNA is generally detectable in upper respiratoy specimens during the acute phase of infection. The lowest concentration of SARS-CoV-2 viral copies this assay can detect is 131 copies/mL. A negative result does not preclude SARS-Cov-2 infection and should not be used as the sole basis for treatment or other patient management decisions. A negative result may occur with  improper specimen collection/handling, submission of specimen other than nasopharyngeal swab, presence of viral mutation(s) within the areas targeted by this assay, and inadequate number of viral copies (<131 copies/mL). A negative result must be combined with clinical observations, patient history, and epidemiological information. The expected result is Negative. Fact Sheet for Patients:  https://www.moore.com/ Fact Sheet for Healthcare Providers:  https://www.young.biz/ This test is not yet ap proved or cleared by the [place] FDA and  has been authorized for detection and/or diagnosis of SARS-CoV-2 by FDA under an Emergency Use Authorization (EUA). This EUA will remain  in effect (meaning this test can be  used) for the duration of the COVID-19 declaration under Section 564(b)(1) of the Act, 21 U.S.C. section 360bbb-3(b)(1), unless the authorization is terminated or revoked sooner.    Influenza A by PCR NEGATIVE NEGATIVE Final   Influenza B by PCR NEGATIVE NEGATIVE Final    Comment: (NOTE) The Xpert Xpress SARS-CoV-2/FLU/RSV assay is intended as an aid in  the diagnosis of influenza from Nasopharyngeal swab specimens and  should not be used as a sole basis for treatment. Nasal washings and  aspirates are unacceptable for Xpert Xpress SARS-CoV-2/FLU/RSV  testing. Fact Sheet for Patients: https://www.moore.com/ Fact Sheet for Healthcare Providers: https://www.young.biz/ This test is not yet approved or cleared by the [place] FDA and  has been authorized for detection and/or diagnosis of SARS-CoV-2 by  FDA under an Emergency Use Authorization (EUA). This EUA will remain  in effect (meaning this test can be used) for the duration of the  Covid-19 declaration under Section 564(b)(1) of the Act, 21  U.S.C. section 360bbb-3(b)(1), unless the authorization is  terminated or revoked. Performed at [facility], [address], [place], [place] [address]   Urine culture     Status: Abnormal   Collection Time: 11/05/19 12:04 AM   Specimen: Urine, Random  Result Value Ref Range Status   Specimen Description   Final    URINE, RANDOM Performed at [facility], [address] [place], [place] [address]    Special Requests   Final    NONE Performed at [facility], [address], [place], [place] [address]    Culture MULTIPLE SPECIES PRESENT, SUGGEST RECOLLECTION (A)  Final   Report Status 11/06/2019 FINAL  Final  SARS CORONAVIRUS 2 (TAT 6-24 HRS) Nasopharyngeal Nasopharyngeal Swab     Status: None   Collection Time: 11/09/19  1:49 AM   Specimen: Nasopharyngeal Swab  Result Value Ref Range  Status   SARS Coronavirus 2 NEGATIVE NEGATIVE Final  Comment: (NOTE) SARS-CoV-2 target nucleic acids are NOT DETECTED. The SARS-CoV-2 RNA is generally detectable in upper and lower respiratory specimens during the acute phase of infection. Negative results do not preclude SARS-CoV-2 infection, do not rule out co-infections with other pathogens, and should not be used as the sole basis for treatment or other patient management decisions. Negative results must be combined with clinical observations, patient history, and epidemiological information. The expected result is Negative. Fact Sheet for Patients: HairSlick.no Fact Sheet for Healthcare Providers: quierodirigir.com This test is not yet approved or cleared by the [place] FDA and  has been authorized for detection and/or diagnosis of SARS-CoV-2 by FDA under an Emergency Use Authorization (EUA). This EUA will remain  in effect (meaning this test can be used) for the duration of the COVID-19 declaration under Section 56 4(b)(1) of the Act, 21 U.S.C. section 360bbb-3(b)(1), unless the authorization is terminated or revoked sooner. Performed at [facility], [address], [place], [place] [address]     Radiology Studies: No results found.   Selina Tapper T. Axyl Sitzman [facility]  If 7PM-7AM, please contact night-coverage [email or web address] Password [facility] 11/11/2019, 1:48 PM

## 2019-11-11 NOTE — Progress Notes (Signed)
Progress Note  Patient Name: Amy Barnes Date of Encounter: 11/11/2019  Primary Cardiologist:   No primary care provider on file.   Subjective   No acute SOB.  Still with some mild lower abdominal pain but not with N/V.  Eating breakfast.  Much more awake this morning.   Inpatient Medications    Scheduled Meds: . aspirin EC  81 mg Oral Daily  . carvedilol  6.25 mg Oral BID  . enoxaparin (LOVENOX) injection  80 mg Subcutaneous Q24H  . feeding supplement (ENSURE ENLIVE)  237 mL Oral BID BM  . FLUoxetine  40 mg Oral Daily  . folic acid  1 mg Oral Daily  . insulin aspart  0-9 Units Subcutaneous Q4H  . lamoTRIgine  200 mg Oral Daily  . levothyroxine  150 mcg Oral QHS  . losartan  25 mg Oral BID  . mometasone-formoterol  2 puff Inhalation BID  . montelukast  10 mg Oral QHS  . multivitamin with minerals  1 tablet Oral Daily  . OXcarbazepine  300 mg Oral Daily   And  . OXcarbazepine  600 mg Oral QHS  . pantoprazole  40 mg Oral BID  . simvastatin  40 mg Oral QPM  . sodium chloride flush  3 mL Intravenous Q12H  . sodium chloride flush  3 mL Intravenous Q12H  . sodium chloride flush  3 mL Intravenous Q12H  . sucralfate  1 g Oral TID WC & HS  . thiamine  100 mg Oral Daily   Or  . thiamine  100 mg Intravenous Daily   Continuous Infusions: . sodium chloride    . sodium chloride     PRN Meds: sodium chloride, sodium chloride, acetaminophen, albuterol, diazepam, LORazepam **OR** LORazepam, morphine injection, ondansetron (ZOFRAN) IV, prochlorperazine, senna-docusate, sodium chloride flush, sodium chloride flush   Vital Signs    Vitals:   11/11/19 0406 11/11/19 0440 11/11/19 0500 11/11/19 0724  BP: (!) 128/56     Pulse: 67 68    Resp: 17 15    Temp: 97.8 F (36.6 C)     TempSrc: Oral     SpO2: 99%   97%  Weight:   (!) 169.3 kg   Height:        Intake/Output Summary (Last 24 hours) at 11/11/2019 0820 Last data filed at 11/11/2019 0156 Gross per 24 hour  Intake 180  ml  Output 500 ml  Net -320 ml   Filed Weights   11/08/19 1829 11/09/19 1636 11/11/19 0500  Weight: (!) 166.8 kg (!) 166.5 kg (!) 169.3 kg    Telemetry    NSR- Personally Reviewed  ECG    NA - Personally Reviewed  Physical Exam   GEN: No  acute distress.   Neck: No  JVD Cardiac: RRR, no murmurs, rubs, or gallops.  Respiratory: Clear  to auscultation bilaterally. GI: Soft, nontender, non-distended, normal bowel sounds  MS:  No edema; No deformity.  Right radial site without bleeding or bruising. Neuro:   Nonfocal  Psych: Oriented and appropriate    Labs    Chemistry Recent Labs  Lab 11/04/19 1535 11/04/19 1535 11/05/19 0510 11/06/19 1013 11/08/19 2057 11/10/19 0353 11/11/19 0321  NA 130*   < > 126*   < > 138 136 138  K 3.6   < > 3.3*   < > 4.2 3.9 3.9  CL 91*   < > 93*   < > 100 98 97*  CO2 27   < >  22   < > GLUCOSE 165*   < > 145*   < > 217* 159* 164*  BUN 34*   < > 39*   < > CREATININE 2.29*   < > 2.26*   < > 0.56 0.63 0.68  CALCIUM 9.2   < > 8.4*   < > 9.0 8.7* 8.9  PROT 7.3  --  6.2*  --  7.4  --   --   ALBUMIN 4.4  --  3.6  --  4.2  --   --   AST 24  --  22  --  28  --   --   ALT 34  --  30  --  32  --   --   ALKPHOS 86  --  76  --  83  --   --   BILITOT 0.9  --  1.0  --  0.6  --   --   GFRNONAA 21*   < > 22*   < > >60 >60 >60  GFRAA 25*   < > 25*   < > >60 >60 >60  ANIONGAP 12   < > 11   < > < > = values in this interval not displayed.     Hematology Recent Labs  Lab 11/08/19 2057 11/10/19 0353 11/11/19 0321  WBC 8.5 7.7 6.3  RBC 4.97 3.79* 3.75*  HGB 14.1 10.8* 10.6*  HCT 43.0 33.7* 34.0*  MCV 86.5 88.9 90.7  MCH 28.4 28.5 28.3  MCHC 32.8 32.0 31.2  RDW 16.0* 15.9* 16.0*  PLT 288 223 216    Cardiac EnzymesNo results for input(s): TROPONINI in the last 168 hours. No results for input(s): TROPIPOC in the last 168 hours.   BNP Recent Labs  Lab 11/09/19 0504 11/11/19 0321  BNP 565.6* 143.6*      DDimer  Recent Labs  Lab 11/08/19 2307  DDIMER 2.09*     Radiology    CARDIAC CATHETERIZATION  Result Date: 11/10/2019 Flouroscopy reveals extensive mitral annular calcification. Normal epicardial coronary arteries. LV EDP 23 mmHg RECOMMENDATION: Medical therapy for multiple risk factors.  VAS [place] LOWER EXTREMITY VENOUS (DVT)  Result Date: 11/10/2019  Lower Venous DVT Study Indications: Elevated Ddimer.  Risk Factors: None identified. Limitations: Body habitus, poor ultrasound/tissue interface and patient positioning, patient pain tolerance. Comparison Study: No prior studies. Performing Technologist: Chanda Busing RVT  Examination Guidelines: A complete evaluation includes B-mode imaging, spectral Doppler, color Doppler, and power Doppler as needed of all accessible portions of each vessel. Bilateral testing is considered an integral part of a complete examination. Limited examinations for reoccurring indications may be performed as noted. The reflux portion of the exam is performed with the patient in reverse Trendelenburg.  +---------+---------------+---------+-----------+----------+--------------+ RIGHT    CompressibilityPhasicitySpontaneityPropertiesThrombus Aging +---------+---------------+---------+-----------+----------+--------------+ CFV      Full           Yes      Yes                                 +---------+---------------+---------+-----------+----------+--------------+ SFJ      Full                                                        +---------+---------------+---------+-----------+----------+--------------+  FV Prox  Full                                                        +---------+---------------+---------+-----------+----------+--------------+ FV Mid   Full                                                        +---------+---------------+---------+-----------+----------+--------------+ FV Distal                                              Not visualized +---------+---------------+---------+-----------+----------+--------------+ POP                                                   Not visualized +---------+---------------+---------+-----------+----------+--------------+ PTV                                                   Not visualized +---------+---------------+---------+-----------+----------+--------------+ PERO                                                  Not visualized +---------+---------------+---------+-----------+----------+--------------+   +---------+---------------+---------+-----------+----------+--------------+ LEFT     CompressibilityPhasicitySpontaneityPropertiesThrombus Aging +---------+---------------+---------+-----------+----------+--------------+ CFV      Full           Yes      Yes                                 +---------+---------------+---------+-----------+----------+--------------+ SFJ      Full                                                        +---------+---------------+---------+-----------+----------+--------------+ FV Prox  Full                                                        +---------+---------------+---------+-----------+----------+--------------+ FV Mid                  Yes      Yes                                 +---------+---------------+---------+-----------+----------+--------------+ FV Distal  Not visualized +---------+---------------+---------+-----------+----------+--------------+ POP      Full           Yes      Yes                                 +---------+---------------+---------+-----------+----------+--------------+ PTV                                                   Not visualized +---------+---------------+---------+-----------+----------+--------------+ PERO                                                  Not visualized  +---------+---------------+---------+-----------+----------+--------------+     Summary: RIGHT: - There is no evidence of deep vein thrombosis in the lower extremity. However, portions of this examination were limited- see technologist comments above.  - No cystic structure found in the popliteal fossa.  LEFT: - There is no evidence of deep vein thrombosis in the lower extremity. However, portions of this examination were limited- see technologist comments above.  - No cystic structure found in the popliteal fossa.  *See table(s) above for measurements and observations. Electronically signed by Coral Else MD on 11/10/2019 at 7:29:36 AM.    Final    ECHOCARDIOGRAM LIMITED  Result Date: 11/09/2019    ECHOCARDIOGRAM LIMITED REPORT   Patient Name:   MELEAH STUMPH Hartnett Date of Exam: 11/09/2019 Medical Rec #:  [id number]     Height:       64.0 in Accession #:    [id number]    Weight:       367.8 lb Date of Birth:  1951/04/01      BSA:          2.53 m Patient Age:    68 years      BP:           142/84 mmHg Patient Gender: F             HR:           87 bpm. Exam Location:  Inpatient Procedure: Limited Echo and Intracardiac Opacification Agent Indications:    CHF  History:        Patient has prior history of Echocardiogram examinations, most                 recent 11/05/2019. CAD and Acute MI; Risk Factors:Hypertension,                 Diabetes and Dyslipidemia. Elevated Troponin, Morbid obesity.  Sonographer:    Lavenia Atlas Referring Phys: [id number] VASUNDHRA RATHORE  Sonographer Comments: Patient is morbidly obese. Image acquisition challenging due to patient body habitus. IMPRESSIONS  1. There is akinesis of the apical septal, mid and apical lateral and inferolateral and apical walls. . Left ventricular ejection fraction, by estimation, is 35 to 40%. The left ventricle has moderately decreased function. The left ventrical demonstrates regional wall motion abnormalities. There is mildly increased concentric left  ventricular hypertrophy. Left ventricular diastolic parameters are consistent with Grade I diastolic dysfunction (impaired relaxation). Elevated left ventricular end-diastolic pressure.  2. Right ventricular systolic function is normal. The right ventricular size is normal. Tricuspid  regurgitation signal is inadequate for assessing PA pressure.  3. Severe mitral annular calcification of the posterior mitral valve annulus. No evidence of mitral valve regurgitation. No evidence of mitral stenosis.  4. The aortic valve is normal in structure and function. Aortic valve regurgitation is not visualized. No aortic stenosis is present.  5. The inferior vena cava is normal in size with <50% respiratory variability, suggesting right atrial pressure of 8 mmHg. FINDINGS  Left Ventricle: There is akinesis of the apical septal, mid and apical lateral and inferolateral and apical walls. Left ventricular ejection fraction, by estimation, is 35 to 40%. The left ventricle has moderately decreased function. The left ventricle demonstrates regional wall motion abnormalities. The left ventricular internal cavity size was normal in size. There is mildly increased concentric left ventricular hypertrophy. Elevated left ventricular end-diastolic pressure. Right Ventricle: The right ventricular size is normal. No increase in right ventricular wall thickness. Right ventricular systolic function is normal. Tricuspid regurgitation signal is inadequate for assessing PA pressure. Left Atrium: Left atrial size was normal in size. Right Atrium: Right atrial size was normal in size. Pericardium: There is no evidence of pericardial effusion. Mitral Valve: The mitral valve is normal in structure and function. Normal mobility of the mitral valve leaflets. Severe mitral annular calcification of the posterior mitral valve annulus. No evidence of mitral valve stenosis. Tricuspid Valve: The tricuspid valve is normal in structure. Tricuspid valve  regurgitation is not demonstrated. No evidence of tricuspid stenosis. Aortic Valve: The aortic valve is normal in structure and function. Aortic valve regurgitation is not visualized. No aortic stenosis is present. Pulmonic Valve: The pulmonic valve was normal in structure. Pulmonic valve regurgitation is not visualized. No evidence of pulmonic stenosis. Aorta: The aortic root is normal in size and structure. Venous: The inferior vena cava is normal in size with less than 50% respiratory variability, suggesting right atrial pressure of 8 mmHg. The inferior vena cava and the hepatic vein show a normal flow pattern. IAS/Shunts: No atrial level shunt detected by color flow Doppler.  LEFT VENTRICLE PLAX 2D LVIDd:         3.81 cm Diastology LVIDs:         2.57 cm LV e' lateral:   7.07 cm/s LV PW:         1.10 cm LV E/e' lateral: 12.8 LV IVS:        1.26 cm LV e' medial:    5.11 cm/s LV SV Index:   13.43   LV E/e' medial:  17.7  LEFT ATRIUM         Index LA diam:    4.20 cm 1.66 cm/m   AORTA Ao Root diam: 3.20 cm MITRAL VALVE MV Area (PHT): 6.71 cm MV Decel Time: 113 msec MV E velocity: 90.20 cm/s  103 cm/s MV A velocity: 108.00 cm/s 70.3 cm/s MV E/A ratio:  0.84        1.5 Armanda Magic MD Electronically signed by Armanda Magic MD Signature Date/Time: 11/09/2019/1:47:28 PM    Final     Cardiac Studies   Diagnostic Dominance: Right  Echo:    1. There is akinesis of the apical septal, mid and apical lateral and inferolateral and apical walls. . Left ventricular ejection fraction, by estimation, is 35 to 40%. The left ventricle has moderately decreased function. The left ventrical demonstrates regional wall motion abnormalities. There is mildly increased concentric left ventricular hypertrophy. Left ventricular diastolic parameters are consistent with Grade I diastolic dysfunction (impaired relaxation). Elevated  left ventricular end-diastolic pressure. 2. Right ventricular systolic function is normal. The  right ventricular size is normal. Tricuspid regurgitation signal is inadequate for assessing PA pressure. 3. Severe mitral annular calcification of the posterior mitral valve annulus. No evidence of mitral valve regurgitation. No evidence of mitral stenosis. 4. The aortic valve is normal in structure and function. Aortic valve regurgitation is not visualized. No aortic stenosis is present. 5. The inferior vena cava is normal in size with <50% respiratory variability, suggesting right atrial pressure of 8 mmHg.     Patient Profile     69 y.o. female with a hx of morbid obesity, anemia, anxiety, asthma, emphysema, depression, bipolar affective disorder, previously diet controlled DM 2 (hemoglobin A1c 7.2 this admission), hypertension, hyperlipidemia and hypothyroidism who is being seen for the evaluation of elevated troponin at the request of Dr. Loney Loh. She is now noted to have a newly reduced EF.    Assessment & Plan    ACUTE SYSTOLIC HF:  New diagnosis.  Normal coronaries as above.   Her EF is reduced compared with a few weeks ago.  EDP is mildly elevated.  She did not have a right heart cath.  I am not convinced that her nausea vomiting is because of low output heart failure.  She has been started on low dose beta blocker and ARB.  Not much UO.  Very difficult to assess her volume status with her morbid obesity.  However, BNP is near normal.  O2 sats are OK.  I will start a low dose of diuretic.   PROLONGED QTC with LBBB:      Repeat EKG.  Prolonged QT for LBBB is about greater than 560 in a woman.     For questions or updates, please contact [facility] Please consult [email or web address] for contact info under Cardiology/STEMI.   Signed, Rollene Rotunda, MD  11/11/2019, 8:20 AM

## 2019-11-11 NOTE — Progress Notes (Signed)
Patient with only 300 mls of urine output during shift which was amber in color. Attempted to perform bladder scan with no success. Patient with approximately 12 ounces of fluid intake. Will update oncoming shift.

## 2019-11-12 ENCOUNTER — Encounter (HOSPITAL_COMMUNITY)
Admission: EM | Disposition: A | Discharge status: Home / Self Care | Payer: Self-pay | Source: Home / Self Care | Attending: Student

## 2019-11-12 ENCOUNTER — Encounter (HOSPITAL_COMMUNITY): Payer: Self-pay | Admitting: Internal Medicine

## 2019-11-12 ENCOUNTER — Inpatient Hospital Stay (HOSPITAL_COMMUNITY): Payer: Medicare Other | Admitting: Anesthesiology

## 2019-11-12 DIAGNOSIS — R1013 Epigastric pain: Secondary | ICD-10-CM

## 2019-11-12 DIAGNOSIS — R9431 Abnormal electrocardiogram [ECG] [EKG]: Secondary | ICD-10-CM

## 2019-11-12 DIAGNOSIS — I5020 Unspecified systolic (congestive) heart failure: Secondary | ICD-10-CM

## 2019-11-12 HISTORY — PX: ESOPHAGOGASTRODUODENOSCOPY (EGD) WITH PROPOFOL: SHX5813

## 2019-11-12 LAB — RENAL FUNCTION PANEL
Albumin: 2.9 g/dL — ABNORMAL LOW (ref 3.5–5.0)
Anion gap: 8 (ref 5–15)
BUN: 9 mg/dL (ref 8–23)
CO2: 29 mmol/L (ref 22–32)
Calcium: 8.7 mg/dL — ABNORMAL LOW (ref 8.9–10.3)
Chloride: 100 mmol/L (ref 98–111)
Creatinine, Ser: 0.62 mg/dL (ref 0.44–1.00)
GFR calc Af Amer: >60 mL/min (ref >60)
GFR calc non Af Amer: >60 mL/min (ref >60)
Glucose, Bld: 136 mg/dL — ABNORMAL HIGH (ref 70–99)
Phosphorus: 3.4 mg/dL (ref 2.5–4.6)
Potassium: 4.1 mmol/L (ref 3.5–5.1)
Sodium: 137 mmol/L (ref 135–145)

## 2019-11-12 LAB — CBC
HCT: 31.8 % — ABNORMAL LOW (ref 36.0–46.0)
Hemoglobin: 10.2 g/dL — ABNORMAL LOW (ref 12.0–15.0)
MCH: 28.3 pg (ref 26.0–34.0)
MCHC: 32.1 g/dL (ref 30.0–36.0)
MCV: 88.1 fL (ref 80.0–100.0)
Platelets: 196 10*3/uL (ref 150–400)
RBC: 3.61 MIL/uL — ABNORMAL LOW (ref 3.87–5.11)
RDW: 15.6 % — ABNORMAL HIGH (ref 11.5–15.5)
WBC: 6.9 10*3/uL (ref 4.0–10.5)
nRBC: 0 % (ref 0.0–0.2)

## 2019-11-12 LAB — GLUCOSE, CAPILLARY
Glucose-Capillary: 126 mg/dL — ABNORMAL HIGH (ref 70–99)
Glucose-Capillary: 136 mg/dL — ABNORMAL HIGH (ref 70–99)
Glucose-Capillary: 102 mg/dL — ABNORMAL HIGH (ref 70–99)
Glucose-Capillary: 120 mg/dL — ABNORMAL HIGH (ref 70–99)
Glucose-Capillary: 149 mg/dL — ABNORMAL HIGH (ref 70–99)

## 2019-11-12 LAB — MAGNESIUM: Magnesium: 1.8 mg/dL (ref 1.7–2.4)

## 2019-11-12 SURGERY — ESOPHAGOGASTRODUODENOSCOPY (EGD) WITH PROPOFOL
Anesthesia: Monitor Anesthesia Care

## 2019-11-12 MED ORDER — SODIUM CHLORIDE 0.9 % IV SOLN: INTRAVENOUS | Status: DC

## 2019-11-12 MED ORDER — PROPOFOL 10 MG/ML IV BOLUS
INTRAVENOUS | Status: DC | PRN
  Administered 2019-11-12 (×2): 20 mg via INTRAVENOUS

## 2019-11-12 MED ORDER — DIAZEPAM 5 MG PO TABS
5.0000 mg | ORAL_TABLET | Freq: Two times a day (BID) | ORAL | Status: DC | PRN
  Filled 2019-11-12: qty 1

## 2019-11-12 MED ORDER — PROPOFOL 500 MG/50ML IV EMUL
INTRAVENOUS | Status: DC | PRN
  Administered 2019-11-12: 100 ug/kg/min via INTRAVENOUS

## 2019-11-12 MED ORDER — LACTATED RINGERS IV SOLN: INTRAVENOUS | Status: DC

## 2019-11-12 SURGICAL SUPPLY — 15 items

## 2019-11-12 NOTE — Progress Notes (Signed)
PROGRESS NOTE  Amy Barnes YHC:[id number] DOB: [birth year]   PCP: [name], MD  Patient is from: Home.  DOA: 11/08/2019 LOS: 3  Brief Narrative / Interim history: 69 year old female with history of DM-2, HTN, HLD, asthma, emphysema, bipolar disorder, anxiety, depression, anemia, hypothyroidism and alcohol use presented to [facility] long ED with emesis and abdominal pain and admitted for non-STEMI and acute systolic CHF.  In ED, slightly tachycardic.  Slightly hypertensive.  88% on room air requiring supplemental oxygen.  High-sensitivity troponin 106>> 309.  D-dimer 2.09.  EKG with LBBB (chronic).  CTA chest negative for PE but concerning for CHF.  Patient had acute chest pain and shortness of breath on the way back from CT imaging.  Cardiology consulted and started patient on heparin drip.  She will transferred to [facility] for heart catheterization.  Echocardiogram with EF of 35 to 40%, G1 DD, RWMA and severe mitral valve calcification without stenosis. LHC on 2/11 with normal coronaries.   GI consulted 2/12 due to recurrent nausea/vomiting and abdominal pain.   Subjective: No major events overnight or this morning.  Sitting on the edge of the bed.  Reports mild abdominal pain.  She rates her pain 3/10.  Intermittent nausea but no emesis.  Denies chest pain or dyspnea.  Objective: Vitals:   11/12/19 0428 11/12/19 0500 11/12/19 0742 11/12/19 1116  BP:   125/62   Pulse:   73 74  Resp:   13 16  Temp: 98.5 F (36.9 C)  98.4 F (36.9 C)   TempSrc: Oral  Oral   SpO2:   96% 100%  Weight:  (!) 169.1 kg    Height:        Intake/Output Summary (Last 24 hours) at 11/12/2019 1118 Last data filed at 11/12/2019 0600 Gross per 24 hour  Intake 31.03 ml  Output 1775 ml  Net -1743.97 ml   Filed Weights   11/09/19 1636 11/11/19 0500 11/12/19 0500  Weight: (!) 166.5 kg (!) 169.3 kg (!) 169.1 kg    Examination:  GENERAL: No acute distress.  Appears well.  HEENT: MMM.  Vision and  hearing grossly intact.  NECK: Supple.  No apparent JVD.  RESP:  No IWOB. Good air movement bilaterally. CVS:  RRR. Heart sounds normal.  ABD/GI/GU: Bowel sounds present. Soft.  Some diffuse discomfort with palpation MSK/EXT:  Moves extremities. No apparent deformity. No edema.  SKIN: no apparent skin lesion or wound NEURO: Awake, alert and oriented appropriately.  No apparent focal neuro deficit. PSYCH: Calm. Normal affect.  Procedures:  2/11-LHC with normal coronaries.  Assessment & Plan: Acute respiratory failure with hypoxia: reportedly desaturated to 88%.  Likely due to acute CHF.  She is also at risk for OSA and OHS. -Manage CHF as below. -Breathing treatments for asthma/emphysema. -Wean oxygen as able.  Minimal oxygen to maintain saturation above 90%.  Ambulate daily. -Encourage incentive spirometry -Would benefit from outpatient sleep study.  Non-STEMI/chronic LBP: suspicious history with some troponin elevation. However, LHC with clean coronaries. -Cardiology managing-on Coreg, aspirin -Continue Lipitor  Acute combined CHF: Echo with EF of 35 to 40% (60 to 65% 4 days prior), G1DD, RWMA and mitral valve calcification without stenosis.  CTA concerning for pulmonary edema.  BNP 566> 144.  Troponin elevated as well.  Had 1.8 L UOP without diuretics.  No cardiopulmonary symptoms. -Cardiology managing-plan to start low-dose diuretics after EGD. -GDMT-Coreg, losartan -Monitor fluid status, renal function and electrolytes.  Mitral valve calcification: No stenosis. -Per cardiology.  Recurrent  nausea/vomiting/abdominal pain: potential causes include non-STEMI, gastritis/gastric ulcer, EtOH, iatrogenic and psychogenic.  Although GI symptoms seems to have improved after PPI and Carafate, patient's daughter very concerned because she had to come to ED multiple times for this issue. -GI consulted 2/12-EGD today. -As needed antiemetics  Normocytic anemia: Baseline Hgb 13-14> 14  (admit)> 10.8> 10.6> 10.2.  Reported melena but FOBT negative.  Anemia panel normal. -EGD today. -Continue monitoring  Uncontrolled diabetes with hyperglycemia: A1c 7.2%.  Not on medication at home. Recent Labs    11/11/19 2339 11/12/19 0432 11/12/19 0740  GLUCAP 142* 126* 136*  -Continue current regimen -Would benefit from Jardiance given CHF.  Essential hypertension: Normotensive for most part.  Not on medications at home. -Cardiac meds as above.  History of anxiety/depression/bipolar disorder: Stable. -Continue home medications.  Chronic asthma/emphysema: Stable. -Continue Dulera, Singulair and as needed nebulizers.  Prolonged QTC: Exaggerated due to LBBB.  Hypothyroidism -Continue home Synthroid  Alcohol use: Admits drinking 2 shots nightly daily. -Encourage cessation -CIWA protocol with Ativan -Continue vitamins.  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  Completed antibiotic course.  On doxycycline 100 mg daily prophylactically -Continue home doxycycline 100 mg daily. -Has upcoming ID follow-up in March.  Elevated D-dimer: CTA chest and lower extremity Dopplers negative.  Morbid obesity: Body mass index is 63.99 kg/m. -Encourage lifestyle change to lose weight. -Could benefit from GLP-1 inhibitors  At risk for sleep apnea: -Would benefit from sleep study.        Nutrition Problem: Inadequate oral intake Etiology: nausea  Signs/Symptoms: per patient/family report  Interventions: Ensure Enlive (each supplement provides 350kcal and 20 grams of protein), MVI   DVT prophylaxis: Subcu Lovenox  Code Status: Full code Family Communication: Updated patient's daughter over the phone on 2/12.  Discharge barrier: Acute CHF, recurrent nausea vomiting and abdominal pain Patient is from: Home Final disposition: Likely home once medically stable and cleared by cardiology and GI  Consultants: Cardiology, GI   Microbiology  summarized: COVID-19 negative.  Sch Meds:  Scheduled Meds: . carvedilol  6.25 mg Oral BID  . doxycycline  100 mg Oral Daily  . enoxaparin (LOVENOX) injection  0.5 mg/kg Subcutaneous Q24H  . feeding supplement (ENSURE ENLIVE)  237 mL Oral BID BM  . FLUoxetine  40 mg Oral Daily  . folic acid  1 mg Oral Daily  . insulin aspart  0-9 Units Subcutaneous Q4H  . lamoTRIgine  200 mg Oral Daily  . levothyroxine  150 mcg Oral QHS  . losartan  25 mg Oral BID  . mometasone-formoterol  2 puff Inhalation BID  . montelukast  10 mg Oral QHS  . multivitamin with minerals  1 tablet Oral Daily  . OXcarbazepine  300 mg Oral Daily   And  . OXcarbazepine  600 mg Oral QHS  . pantoprazole  40 mg Oral BID  . simvastatin  40 mg Oral QPM  . sodium chloride flush  3 mL Intravenous Q12H  . sodium chloride flush  3 mL Intravenous Q12H  . sodium chloride flush  3 mL Intravenous Q12H  . sucralfate  1 g Oral TID WC & HS  . thiamine  100 mg Oral Daily   Or  . thiamine  100 mg Intravenous Daily   Continuous Infusions: . sodium chloride    . sodium chloride    . sodium chloride 20 mL/hr at 11/12/19 0425   PRN Meds:.sodium chloride, sodium chloride, acetaminophen, albuterol, diazepam, magnesium hydroxide, morphine injection, ondansetron (ZOFRAN) IV,  polyethylene glycol, prochlorperazine, senna-docusate, sodium chloride flush, sodium chloride flush, sodium phosphate  Antimicrobials: Anti-infectives (From admission, onward)   Start     Dose/Rate Route Frequency Ordered Stop   11/12/19 1000  doxycycline (VIBRA-TABS) tablet 100 mg     100 mg Oral Daily 11/11/19 1404     11/11/19 1400  doxycycline (VIBRA-TABS) tablet 100 mg  Status:  Discontinued     100 mg Oral Every 12 hours 11/11/19 1222 11/11/19 1404       I have personally reviewed the following labs and images: CBC: Recent Labs  Lab 11/08/19 2057 11/10/19 0353 11/11/19 0321 11/12/19 0426  WBC 8.5 7.7 6.3 6.9  NEUTROABS 7.3  --   --   --   HGB  14.1 10.8* 10.6* 10.2*  HCT 43.0 33.7* 34.0* 31.8*  MCV 86.5 88.9 90.7 88.1  PLT 288 223 216 196   BMP &GFR Recent Labs  Lab 11/06/19 1013 11/08/19 2057 11/09/19 0504 11/10/19 0353 11/11/19 0321 11/12/19 0426  NA 135 138  --  136 138 137  K 4.1 4.2  --  3.9 3.9 4.1  CL 102 100  --  98 97* 100  CO2 25 26  --  30 31 29   GLUCOSE 172* 217*  --  159* 164* 136*  BUN 28* 11  --  11 12 9   CREATININE 0.94 0.56  --  0.63 0.68 0.62  CALCIUM 8.8* 9.0  --  8.7* 8.9 8.7*  MG  --   --  1.7  --  1.9 1.8  PHOS  --   --   --   --   --  3.4   Estimated Creatinine Clearance: 108.3 mL/min (by C-G formula based on SCr of 0.62 mg/dL). Liver & Pancreas: Recent Labs  Lab 11/08/19 2057 11/12/19 0426  AST 28  --   ALT 32  --   ALKPHOS 83  --   BILITOT 0.6  --   PROT 7.4  --   ALBUMIN 4.2 2.9*   Recent Labs  Lab 11/08/19 2057  LIPASE 22   No results for input(s): AMMONIA in the last 168 hours. Diabetic: No results for input(s): HGBA1C in the last 72 hours. Recent Labs  Lab 11/11/19 1608 11/11/19 1953 11/11/19 2339 11/12/19 0432 11/12/19 0740  GLUCAP 110* 126* 142* 126* 136*   Cardiac Enzymes: No results for input(s): CKTOTAL, CKMB, CKMBINDEX, TROPONINI in the last 168 hours. No results for input(s): PROBNP in the last 8760 hours. Coagulation Profile: Recent Labs  Lab 11/09/19 0341  INR 1.0   Thyroid Function Tests: No results for input(s): TSH, T4TOTAL, FREET4, T3FREE, THYROIDAB in the last 72 hours. Lipid Profile: No results for input(s): CHOL, HDL, LDLCALC, TRIG, CHOLHDL, LDLDIRECT in the last 72 hours. Anemia Panel: Recent Labs    11/11/19 1510  VITAMINB12 887  FOLATE 42.6  FERRITIN 73  TIBC 277  IRON 57  RETICCTPCT 2.1   Urine analysis:    Component Value Date/Time   COLORURINE YELLOW 11/08/2019 0300   APPEARANCEUR CLEAR 11/08/2019 0300   APPEARANCEUR Clear 03/04/2019 0948   LABSPEC >1.046 (H) 11/08/2019 0300   LABSPEC 1.017 09/25/2012 2123   PHURINE 6.0  11/08/2019 0300   GLUCOSEU 150 (A) 11/08/2019 0300   GLUCOSEU Negative 09/25/2012 2123   HGBUR NEGATIVE 11/08/2019 0300   HGBUR negative 05/17/2010 0946   BILIRUBINUR NEGATIVE 11/08/2019 0300   BILIRUBINUR Negative 03/04/2019 0948   BILIRUBINUR Negative 09/25/2012 2123   KETONESUR 20 (A) 11/08/2019 0300  PROTEINUR 100 (A) 11/08/2019 0300   UROBILINOGEN 0.2 01/14/2019 0934   UROBILINOGEN 1.0 08/30/2012 1511   NITRITE NEGATIVE 11/08/2019 0300   LEUKOCYTESUR NEGATIVE 11/08/2019 0300   LEUKOCYTESUR Negative 09/25/2012 2123   Sepsis Labs: Invalid input(s): PROCALCITONIN, LACTICIDVEN  Microbiology: Recent Results (from the past 240 hour(s))  Respiratory Panel by RT PCR (Flu A&B, Covid) - Nasopharyngeal Swab     Status: None   Collection Time: 11/04/19  6:29 PM   Specimen: Nasopharyngeal Swab  Result Value Ref Range Status   SARS Coronavirus 2 by RT PCR NEGATIVE NEGATIVE Final    Comment: (NOTE) SARS-CoV-2 target nucleic acids are NOT DETECTED. The SARS-CoV-2 RNA is generally detectable in upper respiratoy specimens during the acute phase of infection. The lowest concentration of SARS-CoV-2 viral copies this assay can detect is 131 copies/mL. A negative result does not preclude SARS-Cov-2 infection and should not be used as the sole basis for treatment or other patient management decisions. A negative result may occur with  improper specimen collection/handling, submission of specimen other than nasopharyngeal swab, presence of viral mutation(s) within the areas targeted by this assay, and inadequate number of viral copies (<131 copies/mL). A negative result must be combined with clinical observations, patient history, and epidemiological information. The expected result is Negative. Fact Sheet for Patients:  https://www.moore.com/ Fact Sheet for Healthcare Providers:  https://www.young.biz/ This test is not yet ap proved or cleared by the  [place] FDA and  has been authorized for detection and/or diagnosis of SARS-CoV-2 by FDA under an Emergency Use Authorization (EUA). This EUA will remain  in effect (meaning this test can be used) for the duration of the COVID-19 declaration under Section 564(b)(1) of the Act, 21 U.S.C. section 360bbb-3(b)(1), unless the authorization is terminated or revoked sooner.    Influenza A by PCR NEGATIVE NEGATIVE Final   Influenza B by PCR NEGATIVE NEGATIVE Final    Comment: (NOTE) The Xpert Xpress SARS-CoV-2/FLU/RSV assay is intended as an aid in  the diagnosis of influenza from Nasopharyngeal swab specimens and  should not be used as a sole basis for treatment. Nasal washings and  aspirates are unacceptable for Xpert Xpress SARS-CoV-2/FLU/RSV  testing. Fact Sheet for Patients: https://www.moore.com/ Fact Sheet for Healthcare Providers: https://www.young.biz/ This test is not yet approved or cleared by the [place] FDA and  has been authorized for detection and/or diagnosis of SARS-CoV-2 by  FDA under an Emergency Use Authorization (EUA). This EUA will remain  in effect (meaning this test can be used) for the duration of the  Covid-19 declaration under Section 564(b)(1) of the Act, 21  U.S.C. section 360bbb-3(b)(1), unless the authorization is  terminated or revoked. Performed at [facility], [address], [place], [place] [address]   Urine culture     Status: Abnormal   Collection Time: 11/05/19 12:04 AM   Specimen: Urine, Random  Result Value Ref Range Status   Specimen Description   Final    URINE, RANDOM Performed at [facility], [address], [place], [place] [address]    Special Requests   Final    NONE Performed at [facility], [address], [place], [place] [address]    Culture MULTIPLE SPECIES PRESENT, SUGGEST RECOLLECTION (A)  Final   Report Status  11/06/2019 FINAL  Final  SARS CORONAVIRUS 2 (TAT 6-24 HRS) Nasopharyngeal Nasopharyngeal Swab     Status: None   Collection Time: 11/09/19  1:49 AM   Specimen: Nasopharyngeal Swab  Result Value Ref Range Status   SARS Coronavirus 2 NEGATIVE NEGATIVE Final    Comment: (NOTE) SARS-CoV-2 target nucleic acids are NOT DETECTED. The SARS-CoV-2 RNA is generally detectable in upper and lower respiratory specimens during the acute phase of infection. Negative results do not preclude SARS-CoV-2 infection, do not rule out co-infections with other pathogens, and should not be used as the sole basis for treatment or other patient management decisions. Negative results must be combined with clinical observations, patient history, and epidemiological information. The expected result is Negative. Fact Sheet for Patients: HairSlick.no Fact Sheet for Healthcare Providers: quierodirigir.com This test is not yet approved or cleared by the [place] FDA and  has been authorized for detection and/or diagnosis of SARS-CoV-2 by FDA under an Emergency Use Authorization (EUA). This EUA will remain  in effect (meaning this test can be used) for the duration of the COVID-19 declaration under Section 56 4(b)(1) of the Act, 21 U.S.C. section 360bbb-3(b)(1), unless the authorization is terminated or revoked sooner. Performed at [facility], [address] [place], [place] [address]     Radiology Studies: No results found.   Taye T. Gonfa [facility]  If 7PM-7AM, please contact night-coverage [email or web address] Password [facility] 11/12/2019, 11:18 AM

## 2019-11-12 NOTE — Anesthesia Preprocedure Evaluation (Addendum)
Anesthesia Evaluation  Patient identified by MRN, date of birth, ID band Patient awake    Reviewed: Allergy & Precautions, NPO status , Patient's Chart, lab work & pertinent test results  Airway Mallampati: I  TM Distance: >3 FB Neck ROM: Full    Dental no notable dental hx.    Pulmonary asthma , sleep apnea , COPD,  COPD inhaler, former smoker,  Centrilobular emphysema    Pulmonary exam normal breath sounds clear to auscultation       Cardiovascular hypertension, + CAD and +CHF  Normal cardiovascular exam Rhythm:Regular Rate:Normal  ECG: rate 70. Normal sinus rhythm Left axis deviation. Left ventricular hypertrophy with QRS widening and repolarization abnormality  ECHO: 1. There is akinesis of the apical septal, mid and apical lateral and inferolateral and apical walls. . Left ventricular ejection fraction, by estimation, is 35 to 40%. The left ventricle has moderately decreased function. The left ventrical demonstrates regional wall motion abnormalities. There is mildly increased concentric left ventricular hypertrophy. Left ventricular diastolic parameters are consistent with Grade I diastolic dysfunction (impaired relaxation). Elevated left ventricular end-diastolic pressure. 2. Right ventricular systolic function is normal. The right ventricular size is normal. Tricuspid regurgitation signal is inadequate for assessing PA pressure. 3. Severe mitral annular calcification of the posterior mitral valve annulus. No evidence of mitral valve regurgitation. No evidence of mitral stenosis. 4. The aortic valve is normal in structure and function. Aortic valve regurgitation is not visualized. No aortic stenosis is present. 5. The inferior vena cava is normal in size with <50% respiratory variability, suggesting right atrial pressure of 8 mmHg.   Neuro/Psych PSYCHIATRIC DISORDERS Anxiety Depression Bipolar Disorder  Neuromuscular  disease    GI/Hepatic Neg liver ROS, GERD  Medicated and Controlled,  Endo/Other  diabetesHypothyroidism Morbid obesity  Renal/GU negative Renal ROS     Musculoskeletal negative musculoskeletal ROS (+)   Abdominal (+) + obese,   Peds  Hematology  (+) anemia , HLD   Anesthesia Other Findings Nausea/vomiting  Reproductive/Obstetrics                            Anesthesia Physical Anesthesia Plan  ASA: IV  Anesthesia Plan: MAC   Post-op Pain Management:    Induction: Intravenous  PONV Risk Score and Plan: 2 and Propofol infusion and Treatment may vary due to age or medical condition  Airway Management Planned: Nasal Cannula  Additional Equipment:   Intra-op Plan:   Post-operative Plan:   Informed Consent: I have reviewed the patients History and Physical, chart, labs and discussed the procedure including the risks, benefits and alternatives for the proposed anesthesia with the patient or authorized representative who has indicated his/her understanding and acceptance.     Dental advisory given  Plan Discussed with: CRNA  Anesthesia Plan Comments:        Anesthesia Quick Evaluation

## 2019-11-12 NOTE — Transfer of Care (Signed)
Immediate Anesthesia Transfer of Care Note  Patient: KERRIANN KAMPHUIS  Procedure(s) Performed: ESOPHAGOGASTRODUODENOSCOPY (EGD) WITH PROPOFOL (N/A )  Patient Location: Endoscopy Unit  Anesthesia Type:MAC  Level of Consciousness: awake, alert  and oriented  Airway & Oxygen Therapy: Patient Spontanous Breathing  Post-op Assessment: Report given to RN and Post -op Vital signs reviewed and stable  Post vital signs: Reviewed and stable  Last Vitals:  Vitals Value Taken Time  BP 125/44 11/12/19 1508  Temp 36.4 C 11/12/19 1508  Pulse 72 11/12/19 1509  Resp 19 11/12/19 1509  SpO2 98 % 11/12/19 1509  Vitals shown include unvalidated device data.  Last Pain:  Vitals:   11/12/19 1508  TempSrc: Oral  PainSc: 0-No pain      Patients Stated Pain Goal: 0 (52/48/18 5909)  Complications: No apparent anesthesia complications

## 2019-11-12 NOTE — Progress Notes (Signed)
Progress Note  Patient Name: Amy Barnes Date of Encounter: 11/12/2019  Primary Cardiologist:   Hochrein   Subjective   No dyspnea having GI procedure today   Inpatient Medications    Scheduled Meds: . carvedilol  6.25 mg Oral BID  . doxycycline  100 mg Oral Daily  . enoxaparin (LOVENOX) injection  0.5 mg/kg Subcutaneous Q24H  . feeding supplement (ENSURE ENLIVE)  237 mL Oral BID BM  . FLUoxetine  40 mg Oral Daily  . folic acid  1 mg Oral Daily  . insulin aspart  0-9 Units Subcutaneous Q4H  . lamoTRIgine  200 mg Oral Daily  . levothyroxine  150 mcg Oral QHS  . losartan  25 mg Oral BID  . mometasone-formoterol  2 puff Inhalation BID  . montelukast  10 mg Oral QHS  . multivitamin with minerals  1 tablet Oral Daily  . OXcarbazepine  300 mg Oral Daily   And  . OXcarbazepine  600 mg Oral QHS  . pantoprazole  40 mg Oral BID  . simvastatin  40 mg Oral QPM  . sodium chloride flush  3 mL Intravenous Q12H  . sodium chloride flush  3 mL Intravenous Q12H  . sodium chloride flush  3 mL Intravenous Q12H  . sucralfate  1 g Oral TID WC & HS  . thiamine  100 mg Oral Daily   Or  . thiamine  100 mg Intravenous Daily   Continuous Infusions: . sodium chloride    . sodium chloride    . sodium chloride 20 mL/hr at 11/12/19 0425   PRN Meds: sodium chloride, sodium chloride, acetaminophen, albuterol, diazepam, magnesium hydroxide, morphine injection, ondansetron (ZOFRAN) IV, polyethylene glycol, prochlorperazine, senna-docusate, sodium chloride flush, sodium chloride flush, sodium phosphate   Vital Signs    Vitals:   11/11/19 2000 11/12/19 0400 11/12/19 0428 11/12/19 0500  BP: 131/62     Pulse: 74     Resp: 16     Temp: 98.3 F (36.8 C) 98.5 F (36.9 C) 98.5 F (36.9 C)   TempSrc: Oral Oral Oral   SpO2: 92%     Weight:    (!) 169.1 kg  Height:        Intake/Output Summary (Last 24 hours) at 11/12/2019 2683 Last data filed at 11/12/2019 0600 Gross per 24 hour  Intake  31.03 ml  Output 1775 ml  Net -1743.97 ml   Filed Weights   11/09/19 1636 11/11/19 0500 11/12/19 0500  Weight: (!) 166.5 kg (!) 169.3 kg (!) 169.1 kg    Telemetry    NSR- Personally Reviewed  ECG    NA - Personally Reviewed  Physical Exam   GEN: No  acute distress.   Neck: No  JVD Cardiac: RRR, no murmurs, rubs, or gallops.  Respiratory: Clear  to auscultation bilaterally. GI: Soft, nontender, non-distended, normal bowel sounds  MS:  No edema; No deformity.  Right radial site without bleeding or bruising. Neuro:   Nonfocal  Psych: Oriented and appropriate    Labs    Chemistry Recent Labs  Lab 11/08/19 2057 11/08/19 2057 11/10/19 0353 11/11/19 0321 11/12/19 0426  NA 138   < > 136 138 137  K 4.2   < > 3.9 3.9 4.1  CL 100   < > 98 97* 100  CO2 26   < > 30 31 29   GLUCOSE 217*   < > 159* 164* 136*  BUN 11   < > 11 12 9   CREATININE 0.56   < >  0.63 0.68 0.62  CALCIUM 9.0   < > 8.7* 8.9 8.7*  PROT 7.4  --   --   --   --   ALBUMIN 4.2  --   --   --  2.9*  AST 28  --   --   --   --   ALT 32  --   --   --   --   ALKPHOS 83  --   --   --   --   BILITOT 0.6  --   --   --   --   GFRNONAA >60   < > >60 >60 >60  GFRAA >60   < > >60 >60 >60  ANIONGAP 12   < > 8 10 8    < > = values in this interval not displayed.     Hematology Recent Labs  Lab 11/10/19 0353 11/10/19 0353 11/11/19 0321 11/11/19 1510 11/12/19 0426  WBC 7.7  --  6.3  --  6.9  RBC 3.79*   < > 3.75* 4.03 3.61*  HGB 10.8*  --  10.6*  --  10.2*  HCT 33.7*  --  34.0*  --  31.8*  MCV 88.9  --  90.7  --  88.1  MCH 28.5  --  28.3  --  28.3  MCHC 32.0  --  31.2  --  32.1  RDW 15.9*  --  16.0*  --  15.6*  PLT 223  --  216  --  196   < > = values in this interval not displayed.    Cardiac EnzymesNo results for input(s): TROPONINI in the last 168 hours. No results for input(s): TROPIPOC in the last 168 hours.   BNP Recent Labs  Lab 11/09/19 0504 11/11/19 0321  BNP 565.6* 143.6*     DDimer    Recent Labs  Lab 11/08/19 2307  DDIMER 2.09*     Radiology    CARDIAC CATHETERIZATION  Result Date: 11/10/2019 Flouroscopy reveals extensive mitral annular calcification. Normal epicardial coronary arteries. LV EDP 23 mmHg RECOMMENDATION: Medical therapy for multiple risk factors.   Cardiac Studies   Diagnostic Dominance: Right  Echo:    1. There is akinesis of the apical septal, mid and apical lateral and inferolateral and apical walls. . Left ventricular ejection fraction, by estimation, is 35 to 40%. The left ventricle has moderately decreased function. The left ventrical demonstrates regional wall motion abnormalities. There is mildly increased concentric left ventricular hypertrophy. Left ventricular diastolic parameters are consistent with Grade I diastolic dysfunction (impaired relaxation). Elevated left ventricular end-diastolic pressure. 2. Right ventricular systolic function is normal. The right ventricular size is normal. Tricuspid regurgitation signal is inadequate for assessing PA pressure. 3. Severe mitral annular calcification of the posterior mitral valve annulus. No evidence of mitral valve regurgitation. No evidence of mitral stenosis. 4. The aortic valve is normal in structure and function. Aortic valve regurgitation is not visualized. No aortic stenosis is present. 5. The inferior vena cava is normal in size with <50% respiratory variability, suggesting right atrial pressure of 8 mmHg.     Patient Profile     69 y.o. female with a hx of morbid obesity, anemia, anxiety, asthma, emphysema, depression, bipolar affective disorder, previously diet controlled DM 2 (hemoglobin A1c 7.2 this admission), hypertension, hyperlipidemia and hypothyroidism who is being seen for the evaluation of elevated troponin at the request of Dr. 79. She is now noted to have a newly reduced EF.    Assessment &  Plan    ACUTE SYSTOLIC HF:  Non ischemic DCM She has been  started on low dose beta blocker and ARB. Good urine output   Very difficult to assess her volume status with her morbid obesity.  However, BNP is near normal.  O2 sats are OK.  Low albumin does not help Dr Lindaann Slough note indicates starting on diuretic yesterday but don't see any I/O net -1.7 L will observe since she has GI procedure today   PROLONGED QTC with LBBB:     Improved on ECG yesterday 484    GI:  Nausea /Vomiting:  Per GI EGD Danis today   For questions or updates, please contact [facility] Please consult [email or web address] for contact info under Cardiology/STEMI.   Signed, Charlton Haws, MD  11/12/2019, 7:42 AM

## 2019-11-12 NOTE — Anesthesia Postprocedure Evaluation (Signed)
Anesthesia Post Note  Patient: LATIVIA VELIE  Procedure(s) Performed: ESOPHAGOGASTRODUODENOSCOPY (EGD) WITH PROPOFOL (N/A )     Patient location during evaluation: Endoscopy Anesthesia Type: MAC Level of consciousness: awake Pain management: pain level controlled Vital Signs Assessment: post-procedure vital signs reviewed and stable Respiratory status: spontaneous breathing, nonlabored ventilation, respiratory function stable and patient connected to nasal cannula oxygen Cardiovascular status: stable and blood pressure returned to baseline Postop Assessment: no apparent nausea or vomiting Anesthetic complications: no    Last Vitals:  Vitals:   11/12/19 1518 11/12/19 1528  BP: (!) 180/63 (!) 164/70  Pulse: 70 70  Resp: 17 17  Temp:    SpO2: 99% 100%    Last Pain:  Vitals:   11/12/19 1528  TempSrc:   PainSc: 0-No pain                 Maximillian Habibi P Aydenn Gervin

## 2019-11-12 NOTE — Op Note (Signed)
[facility] Patient Name: Amy Barnes Procedure Date : 11/12/2019 MRN: [id number] Attending MD: Starr Lake. Myrtie Neither , MD Date of Birth: Mar 25, 1951 CSN: [id number] Age: 69 Admit Type: Outpatient Procedure:                Upper GI endoscopy Indications:              Epigastric abdominal pain, Nausea with vomiting                            (weeks, insufficient response to meds thus far, no                            source on CTAP) Providers:                Starr Lake. Myrtie Neither, MD, Margaree Mackintosh, RN,                            Everardo Pacific, Technician, Lawson Radar, Technician,                            Dairl Ponder, CRNA Referring MD:             [facility]; Dr. Jeani Hawking Medicines:                Monitored Anesthesia Care Complications:            No immediate complications. Estimated Blood Loss:     Estimated blood loss: none. Procedure:                Pre-Anesthesia Assessment:                           - Prior to the procedure, a History and Physical                            was performed, and patient medications and                            allergies were reviewed. The patient's tolerance of                            previous anesthesia was also reviewed. The risks                            and benefits of the procedure and the sedation                            options and risks were discussed with the patient.                            All questions were answered, and informed consent                            was obtained. Prior Anticoagulants: The patient has  taken no previous anticoagulant or antiplatelet                            agents. ASA Grade Assessment: III - A patient with                            severe systemic disease. After reviewing the risks                            and benefits, the patient was deemed in                            satisfactory condition to undergo the procedure.       After obtaining informed consent, the endoscope was                            passed under direct vision. Throughout the                            procedure, the patient's blood pressure, pulse, and                            oxygen saturations were monitored continuously. The                            GIF-H190 ([id number] Olympus gastroscope was                            introduced through the mouth, and advanced to the                            second part of duodenum. The upper GI endoscopy was                            accomplished without difficulty. The patient                            tolerated the procedure well. Scope In: Scope Out: Findings:      The esophagus was normal.      A small amount of food (residue) was found in the gastric fundus and in       the gastric body as well as a small amount of residual CT oral contrast.      The exam of the stomach was otherwise normal.      The cardia and gastric fundus were normal on retroflexion.      The examined duodenum was normal. Impression:               - Normal esophagus.                           - A small amount of food (residue) in the stomach.                           - Normal examined duodenum.                           -  No specimens collected.                           No cause for symptoms seen. recent Hgb A1c 7.1, so                            good diabetic control. Recommendation:           - Return patient to hospital ward for ongoing care.                           - Soft diet.                           - Continue present medications for symptomatic                            relief                           Consider gastric emptying study, but only if not                            receoving opiods or prokinetic agents, otherwise                            inaccurate results. Procedure Code(s):        --- Professional ---                           [phone], Esophagogastroduodenoscopy, flexible,                             transoral; diagnostic, including collection of                            specimen(s) by brushing or washing, when performed                            (separate procedure) Diagnosis Code(s):        --- Professional ---                           R10.13, Epigastric pain                           R11.2, Nausea with vomiting, unspecified CPT copyright 2019 [employer]. All rights reserved. The codes documented in this report are preliminary and upon coder review may  be revised to meet current compliance requirements. Kilyn Maragh L. Myrtie Neither, MD 11/12/2019 3:10:31 PM This report has been signed electronically. Number of Addenda: 0

## 2019-11-12 NOTE — Interval H&P Note (Signed)
History and Physical Interval Note:  11/12/2019 1:49 PM  Amy Barnes  has presented today for surgery, with the diagnosis of Nausea/vomiting.  The various methods of treatment have been discussed with the patient and family. After consideration of risks, benefits and other options for treatment, the patient has consented to  Procedure(s): ESOPHAGOGASTRODUODENOSCOPY (EGD) WITH PROPOFOL (N/A) as a surgical intervention.  The patient's history has been reviewed, patient examined, no change in status, stable for surgery.  I have reviewed the patient's chart and labs.  Questions were answered to the patient's satisfaction.     Charlie Pitter III

## 2019-11-13 DIAGNOSIS — I5023 Acute on chronic systolic (congestive) heart failure: Secondary | ICD-10-CM

## 2019-11-13 LAB — GLUCOSE, CAPILLARY
Glucose-Capillary: 122 mg/dL — ABNORMAL HIGH (ref 70–99)
Glucose-Capillary: 132 mg/dL — ABNORMAL HIGH (ref 70–99)
Glucose-Capillary: 133 mg/dL — ABNORMAL HIGH (ref 70–99)
Glucose-Capillary: 135 mg/dL — ABNORMAL HIGH (ref 70–99)
Glucose-Capillary: 136 mg/dL — ABNORMAL HIGH (ref 70–99)
Glucose-Capillary: 137 mg/dL — ABNORMAL HIGH (ref 70–99)
Glucose-Capillary: 159 mg/dL — ABNORMAL HIGH (ref 70–99)

## 2019-11-13 LAB — RENAL FUNCTION PANEL
Albumin: 3 g/dL — ABNORMAL LOW (ref 3.5–5.0)
Anion gap: 7 (ref 5–15)
BUN: 8 mg/dL (ref 8–23)
CO2: 28 mmol/L (ref 22–32)
Calcium: 8.6 mg/dL — ABNORMAL LOW (ref 8.9–10.3)
Chloride: 100 mmol/L (ref 98–111)
Creatinine, Ser: 0.57 mg/dL (ref 0.44–1.00)
GFR calc Af Amer: >60 mL/min (ref >60)
GFR calc non Af Amer: >60 mL/min (ref >60)
Glucose, Bld: 120 mg/dL — ABNORMAL HIGH (ref 70–99)
Phosphorus: 3.1 mg/dL (ref 2.5–4.6)
Potassium: 3.9 mmol/L (ref 3.5–5.1)
Sodium: 135 mmol/L (ref 135–145)

## 2019-11-13 LAB — MAGNESIUM: Magnesium: 2 mg/dL (ref 1.7–2.4)

## 2019-11-13 LAB — CBC
HCT: 31.7 % — ABNORMAL LOW (ref 36.0–46.0)
Hemoglobin: 10.1 g/dL — ABNORMAL LOW (ref 12.0–15.0)
MCH: 27.9 pg (ref 26.0–34.0)
MCHC: 31.9 g/dL (ref 30.0–36.0)
MCV: 87.6 fL (ref 80.0–100.0)
Platelets: 212 10*3/uL (ref 150–400)
RBC: 3.62 MIL/uL — ABNORMAL LOW (ref 3.87–5.11)
RDW: 15.5 % (ref 11.5–15.5)
WBC: 6.4 10*3/uL (ref 4.0–10.5)
nRBC: 0 % (ref 0.0–0.2)

## 2019-11-13 MED ORDER — FUROSEMIDE 20 MG PO TABS
20.0000 mg | ORAL_TABLET | Freq: Every day | ORAL | Status: DC
  Administered 2019-11-13 – 2019-11-14 (×2): 20 mg via ORAL
  Filled 2019-11-13 (×2): qty 1

## 2019-11-13 NOTE — Progress Notes (Signed)
CSW acknowledges consult for SNF. PT is recommending no PT follow up at this time. CSW will closed consult for SNF.

## 2019-11-13 NOTE — Progress Notes (Addendum)
Progress Note  Patient Name: Amy Barnes Date of Encounter: 11/13/2019  Primary Cardiologist:   Hochrein   Subjective   No dyspnea / chest pain   Inpatient Medications    Scheduled Meds: . carvedilol  6.25 mg Oral BID  . doxycycline  100 mg Oral Daily  . feeding supplement (ENSURE ENLIVE)  237 mL Oral BID BM  . FLUoxetine  40 mg Oral Daily  . folic acid  1 mg Oral Daily  . insulin aspart  0-9 Units Subcutaneous Q4H  . lamoTRIgine  200 mg Oral Daily  . levothyroxine  150 mcg Oral QHS  . losartan  25 mg Oral BID  . mometasone-formoterol  2 puff Inhalation BID  . montelukast  10 mg Oral QHS  . multivitamin with minerals  1 tablet Oral Daily  . OXcarbazepine  300 mg Oral Daily   And  . OXcarbazepine  600 mg Oral QHS  . pantoprazole  40 mg Oral BID  . simvastatin  40 mg Oral QPM  . sodium chloride flush  3 mL Intravenous Q12H  . sodium chloride flush  3 mL Intravenous Q12H  . sodium chloride flush  3 mL Intravenous Q12H  . sucralfate  1 g Oral TID WC & HS  . thiamine  100 mg Oral Daily   Or  . thiamine  100 mg Intravenous Daily   Continuous Infusions: . sodium chloride    . sodium chloride     PRN Meds: sodium chloride, sodium chloride, acetaminophen, albuterol, diazepam, magnesium hydroxide, ondansetron (ZOFRAN) IV, polyethylene glycol, prochlorperazine, senna-docusate, sodium chloride flush, sodium chloride flush, sodium phosphate   Vital Signs    Vitals:   11/13/19 0018 11/13/19 0500 11/13/19 0739 11/13/19 0806  BP: (!) 171/84 (!) 187/65 (!) 155/76 (!) 155/76  Pulse: 80 76 72 73  Resp: 16 18 19 18   Temp: 98.1 F (36.7 C) 98 F (36.7 C) 97.9 F (36.6 C)   TempSrc: Oral Oral Oral   SpO2: 99% 99% 99% 98%  Weight:  (!) 168.2 kg    Height:        Intake/Output Summary (Last 24 hours) at 11/13/2019 0827 Last data filed at 11/12/2019 2000 Gross per 24 hour  Intake 50 ml  Output --  Net 50 ml   Filed Weights   11/11/19 0500 11/12/19 0500 11/13/19 0500   Weight: (!) 169.3 kg (!) 169.1 kg (!) 168.2 kg    Telemetry    NSR- Personally Reviewed  ECG    NA - Personally Reviewed  Physical Exam   GEN: No  acute distress.   Neck: No  JVD Cardiac: RRR, no murmurs, rubs, or gallops.  Respiratory: Clear  to auscultation bilaterally. GI: Soft, nontender, non-distended, normal bowel sounds  MS:  No edema; No deformity.  Right radial site without bleeding or bruising. Neuro:   Nonfocal  Psych: Oriented and appropriate    Labs    Chemistry Recent Labs  Lab 11/08/19 2057 11/08/19 2057 11/10/19 0353 11/11/19 0321 11/12/19 0426  NA 138   < > 136 138 137  K 4.2   < > 3.9 3.9 4.1  CL 100   < > 98 97* 100  CO2 26   < > 30 31 29   GLUCOSE 217*   < > 159* 164* 136*  BUN 11   < > 11 12 9   CREATININE 0.56   < > 0.63 0.68 0.62  CALCIUM 9.0   < > 8.7* 8.9 8.7*  PROT  7.4  --   --   --   --   ALBUMIN 4.2  --   --   --  2.9*  AST 28  --   --   --   --   ALT 32  --   --   --   --   ALKPHOS 83  --   --   --   --   BILITOT 0.6  --   --   --   --   GFRNONAA >60   < > >60 >60 >60  GFRAA >60   < > >60 >60 >60  ANIONGAP 12   < > 8 10 8    < > = values in this interval not displayed.     Hematology Recent Labs  Lab 11/11/19 0321 11/11/19 1510 11/12/19 0426 11/13/19 0802  WBC 6.3  --  6.9 6.4  RBC 3.75* 4.03 3.61* 3.62*  HGB 10.6*  --  10.2* 10.1*  HCT 34.0*  --  31.8* 31.7*  MCV 90.7  --  88.1 87.6  MCH 28.3  --  28.3 27.9  MCHC 31.2  --  32.1 31.9  RDW 16.0*  --  15.6* 15.5  PLT 216  --  196 212    Cardiac EnzymesNo results for input(s): TROPONINI in the last 168 hours. No results for input(s): TROPIPOC in the last 168 hours.   BNP Recent Labs  Lab 11/09/19 0504 11/11/19 0321  BNP 565.6* 143.6*     DDimer  Recent Labs  Lab 11/08/19 2307  DDIMER 2.09*     Radiology    No results found.  Cardiac Studies   Diagnostic Dominance: Right  Echo:    1. There is akinesis of the apical septal, mid and apical lateral  and inferolateral and apical walls. . Left ventricular ejection fraction, by estimation, is 35 to 40%. The left ventricle has moderately decreased function. The left ventrical demonstrates regional wall motion abnormalities. There is mildly increased concentric left ventricular hypertrophy. Left ventricular diastolic parameters are consistent with Grade I diastolic dysfunction (impaired relaxation). Elevated left ventricular end-diastolic pressure. 2. Right ventricular systolic function is normal. The right ventricular size is normal. Tricuspid regurgitation signal is inadequate for assessing PA pressure. 3. Severe mitral annular calcification of the posterior mitral valve annulus. No evidence of mitral valve regurgitation. No evidence of mitral stenosis. 4. The aortic valve is normal in structure and function. Aortic valve regurgitation is not visualized. No aortic stenosis is present. 5. The inferior vena cava is normal in size with <50% respiratory variability, suggesting right atrial pressure of 8 mmHg.     Patient Profile     69 y.o. female with a hx of morbid obesity, anemia, anxiety, asthma, emphysema, depression, bipolar affective disorder, previously diet controlled DM 2 (hemoglobin A1c 7.2 this admission), hypertension, hyperlipidemia and hypothyroidism who is being seen for the evaluation of elevated troponin at the request of Dr. 79. She is now noted to have a newly reduced EF.    Assessment & Plan    ACUTE SYSTOLIC HF:  Echo 11/09/19 EF 01/07/20  Non ischemic DCM She has been started on low dose beta blocker and ARB. Good urine output   Very difficult to assess her volume status with her morbid obesity.  However, BNP is near normal.  O2 sats are OK.  Low albumin does not help Dr 56-21% note indicates starting on diuretic will start lasix 20 mg daily   PROLONGED QTC with LBBB:  Improved on ECG 2/12 484 msec don't think this is worrisome in setting of LBBB   GI:  Nausea  /Vomiting: EGD yesterday with no pathology   For questions or updates, please contact [facility] Please consult [email or web address] for contact info under Cardiology/STEMI.   Signed, Jenkins Rouge, MD  11/13/2019, 8:27 AM

## 2019-11-13 NOTE — Progress Notes (Signed)
PROGRESS NOTE  Amy Barnes DOB: May 26, 1951   PCP: [name], MD  Patient is from: Home.  DOA: 11/08/2019 LOS: 4  Brief Narrative / Interim history: 69 year old female with history of DM-2, HTN, HLD, asthma, emphysema, bipolar disorder, anxiety, depression, anemia, hypothyroidism and alcohol use presented to [facility] long ED with emesis and abdominal pain and admitted for non-STEMI and acute systolic CHF.  In ED, slightly tachycardic.  Slightly hypertensive.  88% on room air requiring supplemental oxygen.  High-sensitivity troponin 106>> 309.  D-dimer 2.09.  EKG with LBBB (chronic).  CTA chest negative for PE but concerning for CHF.  Patient had acute chest pain and shortness of breath on the way back from CT imaging.  Cardiology consulted and started patient on heparin drip.  She will transferred to [facility] for heart catheterization.  Echocardiogram with EF of 35 to 40%, G1 DD, RWMA and severe mitral valve calcification without stenosis. LHC on 2/11 with normal coronaries.   GI consulted 2/12 due to recurrent nausea/vomiting and abdominal pain.  Status post EGD on 11/12/2019 with no abdominal pathology.  Assessment & Plan: Acute respiratory failure with hypoxia: reportedly desaturated to 88%.  Likely due to acute CHF.  She is also at risk for OSA and OHS. -Manage CHF as below. -Breathing treatments for asthma/emphysema. -No hypoxia anymore.  Maintaining oxygen saturation over 90% on room air now. -Encouraged incentive spirometry -Would benefit from outpatient sleep study.  Non-STEMI/chronic LBP: suspicious history with some troponin elevation. However, LHC with clean coronaries. -Cardiology managing-on Coreg, aspirin -Continue Lipitor  Acute combined CHF: Echo with EF of 35 to 40% (60 to 65% 4 days prior), G1DD, RWMA and mitral valve calcification without stenosis.  CTA concerning for pulmonary edema.  BNP 566> 144.  Troponin elevated as well. No cardiopulmonary  symptoms. -Cardiology managing-cardiology started her on Lasix 20 mg p.o. daily starting today. -Coreg, losartan -Monitor fluid status, renal function and electrolytes.  Mitral valve calcification: No stenosis. -Per cardiology.  Recurrent nausea/vomiting/abdominal pain: CT abdomen pelvis unremarkable.  Status post EGD by GI on 11/12/2019 which was also unremarkable with no abdominal pathology.  She still complains of some nausea and vomiting but no more abdominal pain.  Suspect possible gastroparesis.  Will order gastric emptying study for tomorrow and keep n.p.o. from midnight.  Continue symptomatic treatment in the meantime.  Normocytic anemia: Baseline Hgb 13-14> 14 (admit)> 10.8> 10.6> 10.2.  Reported melena but FOBT negative.  Anemia panel normal.  Status post EGD as mentioned above.  Uncontrolled diabetes with hyperglycemia: A1c 7.2%.  Not on medication at home. Recent Labs    11/13/19 0016 11/13/19 0501 11/13/19 0738  GLUCAP 159* 137* 122*  -Continue current SSI. -Would benefit from Jardiance given CHF.  Essential hypertension: Blood pressure slightly elevated for few times and also has had normal blood pressure for rest of the times..  Normotensive for most part.  Not on medications at home. -Now on losartan and carvedilol.  History of anxiety/depression/bipolar disorder: Stable. -Continue home medications.  Chronic asthma/emphysema: Stable. -Continue Dulera, Singulair and as needed nebulizers.  Prolonged QTC: Exaggerated due to LBBB.  Hypothyroidism -Continue home Synthroid  Alcohol use: Admits drinking 2 shots nightly daily. -Encourage cessation -CIWA protocol with Ativan for 1 more day. -Continue vitamins.  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  Completed antibiotic course.  On doxycycline 100 mg daily prophylactically -Continue home doxycycline 100 mg daily. -Has upcoming ID follow-up in March.  Elevated D-dimer: CTA chest and  lower extremity Dopplers negative.  Morbid obesity: Body mass index is 63.65 kg/m. -Encourage lifestyle change to lose weight. -Could benefit from GLP-1 inhibitors  At risk for sleep apnea: -Would benefit from sleep study.  Nutrition Problem: Inadequate oral intake Etiology: nausea  Signs/Symptoms: per patient/family report  Interventions: Ensure Enlive (each supplement provides 350kcal and 20 grams of protein), MVI   DVT prophylaxis: Subcu Lovenox  Code Status: Full code Family Communication: Daughter at the bedside.  Discussed plan of care in length.  Discharge barrier: Acute CHF, persistent GI symptoms Patient is from: Home Final disposition: Likely home once medically stable and cleared by cardiology   Consultants: Cardiology, GI   Subjective: Seen and examined.  Daughter at the bedside.  Complains of some nausea.  No other complaint at this point in time.  Denied any chest pain, shortness of breath or abdominal pain.  Per daughter, she had vomited x2 yesterday even after EGD.  Objective: Vitals:   11/13/19 0018 11/13/19 0500 11/13/19 0739 11/13/19 0806  BP: (!) 171/84 (!) 187/65 (!) 155/76 (!) 155/76  Pulse: 80 76 72 73  Resp: 16 18 19 18   Temp: 98.1 F (36.7 C) 98 F (36.7 C) 97.9 F (36.6 C)   TempSrc: Oral Oral Oral   SpO2: 99% 99% 99% 98%  Weight:  (!) 168.2 kg    Height:        Intake/Output Summary (Last 24 hours) at 11/13/2019 1128 Last data filed at 11/12/2019 2000 Gross per 24 hour  Intake 50 ml  Output --  Net 50 ml   Filed Weights   11/11/19 0500 11/12/19 0500 11/13/19 0500  Weight: (!) 169.3 kg (!) 169.1 kg (!) 168.2 kg    Examination:  General exam: Appears calm and comfortable  Respiratory system: Clear to auscultation. Respiratory effort normal. Cardiovascular system: S1 & S2 heard, RRR. No JVD, murmurs, rubs, gallops or clicks.  +1 pitting edema bilateral lower extremity Gastrointestinal system: Abdomen is nondistended, soft and  nontender. No organomegaly or masses felt. Normal bowel sounds heard. Central nervous system: Alert and oriented. No focal neurological deficits. Extremities: Symmetric 5 x 5 power. Skin: No rashes, lesions or ulcers.  Psychiatry: Judgement and insight appear normal. Mood & affect appropriate.   Procedures:  2/11-LHC with normal coronaries.  Microbiology summarized: COVID-19 negative.  Sch Meds:  Scheduled Meds: . carvedilol  6.25 mg Oral BID  . doxycycline  100 mg Oral Daily  . feeding supplement (ENSURE ENLIVE)  237 mL Oral BID BM  . FLUoxetine  40 mg Oral Daily  . folic acid  1 mg Oral Daily  . furosemide  20 mg Oral Daily  . insulin aspart  0-9 Units Subcutaneous Q4H  . lamoTRIgine  200 mg Oral Daily  . levothyroxine  150 mcg Oral QHS  . losartan  25 mg Oral BID  . mometasone-formoterol  2 puff Inhalation BID  . montelukast  10 mg Oral QHS  . multivitamin with minerals  1 tablet Oral Daily  . OXcarbazepine  300 mg Oral Daily   And  . OXcarbazepine  600 mg Oral QHS  . pantoprazole  40 mg Oral BID  . simvastatin  40 mg Oral QPM  . sodium chloride flush  3 mL Intravenous Q12H  . sodium chloride flush  3 mL Intravenous Q12H  . sodium chloride flush  3 mL Intravenous Q12H  . sucralfate  1 g Oral TID WC & HS  . thiamine  100 mg Oral Daily  Or  . thiamine  100 mg Intravenous Daily   Continuous Infusions: . sodium chloride    . sodium chloride     PRN Meds:.sodium chloride, sodium chloride, acetaminophen, albuterol, diazepam, magnesium hydroxide, ondansetron (ZOFRAN) IV, polyethylene glycol, prochlorperazine, senna-docusate, sodium chloride flush, sodium chloride flush, sodium phosphate  Antimicrobials: Anti-infectives (From admission, onward)   Start     Dose/Rate Route Frequency Ordered Stop   11/12/19 1000  doxycycline (VIBRA-TABS) tablet 100 mg     100 mg Oral Daily 11/11/19 1404     11/11/19 1400  doxycycline (VIBRA-TABS) tablet 100 mg  Status:  Discontinued      100 mg Oral Every 12 hours 11/11/19 1222 11/11/19 1404       I have personally reviewed the following labs and images: CBC: Recent Labs  Lab 11/08/19 2057 11/10/19 0353 11/11/19 0321 11/12/19 0426 11/13/19 0802  WBC 8.5 7.7 6.3 6.9 6.4  NEUTROABS 7.3  --   --   --   --   HGB 14.1 10.8* 10.6* 10.2* 10.1*  HCT 43.0 33.7* 34.0* 31.8* 31.7*  MCV 86.5 88.9 90.7 88.1 87.6  PLT 288 223 216 196 212   BMP &GFR Recent Labs  Lab 11/08/19 2057 11/09/19 0504 11/10/19 0353 11/11/19 0321 11/12/19 0426 11/13/19 0802  NA 138  --  136 138 137 135  K 4.2  --  3.9 3.9 4.1 3.9  CL 100  --  98 97* 100 100  CO2 26  --  30 31 29 28   GLUCOSE 217*  --  159* 164* 136* 120*  BUN 11  --  11 12 9 8   CREATININE 0.56  --  0.63 0.68 0.62 0.57  CALCIUM 9.0  --  8.7* 8.9 8.7* 8.6*  MG  --  1.7  --  1.9 1.8 2.0  PHOS  --   --   --   --  3.4 3.1   Estimated Creatinine Clearance: 107.8 mL/min (by C-G formula based on SCr of 0.57 mg/dL). Liver & Pancreas: Recent Labs  Lab 11/08/19 2057 11/12/19 0426 11/13/19 0802  AST 28  --   --   ALT 32  --   --   ALKPHOS 83  --   --   BILITOT 0.6  --   --   PROT 7.4  --   --   ALBUMIN 4.2 2.9* 3.0*   Recent Labs  Lab 11/08/19 2057  LIPASE 22   No results for input(s): AMMONIA in the last 168 hours. Diabetic: No results for input(s): HGBA1C in the last 72 hours. Recent Labs  Lab 11/12/19 1655 11/12/19 2034 11/13/19 0016 11/13/19 0501 11/13/19 0738  GLUCAP 120* 149* 159* 137* 122*   Cardiac Enzymes: No results for input(s): CKTOTAL, CKMB, CKMBINDEX, TROPONINI in the last 168 hours. No results for input(s): PROBNP in the last 8760 hours. Coagulation Profile: Recent Labs  Lab 11/09/19 0341  INR 1.0   Thyroid Function Tests: No results for input(s): TSH, T4TOTAL, FREET4, T3FREE, THYROIDAB in the last 72 hours. Lipid Profile: No results for input(s): CHOL, HDL, LDLCALC, TRIG, CHOLHDL, LDLDIRECT in the last 72 hours. Anemia Panel: Recent  Labs    11/11/19 1510  VITAMINB12 887  FOLATE 42.6  FERRITIN 73  TIBC 277  IRON 57  RETICCTPCT 2.1   Urine analysis:    Component Value Date/Time   COLORURINE YELLOW 11/08/2019 0300   APPEARANCEUR CLEAR 11/08/2019 0300   APPEARANCEUR Clear 03/04/2019 0948   LABSPEC >1.046 (H) 11/08/2019 0300  LABSPEC 1.017 09/25/2012 2123   PHURINE 6.0 11/08/2019 0300   GLUCOSEU 150 (A) 11/08/2019 0300   GLUCOSEU Negative 09/25/2012 2123   HGBUR NEGATIVE 11/08/2019 0300   HGBUR negative 05/17/2010 0946   BILIRUBINUR NEGATIVE 11/08/2019 0300   BILIRUBINUR Negative 03/04/2019 0948   BILIRUBINUR Negative 09/25/2012 2123   KETONESUR 20 (A) 11/08/2019 0300   PROTEINUR 100 (A) 11/08/2019 0300   UROBILINOGEN 0.2 01/14/2019 0934   UROBILINOGEN 1.0 08/30/2012 1511   NITRITE NEGATIVE 11/08/2019 0300   LEUKOCYTESUR NEGATIVE 11/08/2019 0300   LEUKOCYTESUR Negative 09/25/2012 2123   Sepsis Labs: Invalid input(s): PROCALCITONIN, LACTICIDVEN  Microbiology: Recent Results (from the past 240 hour(s))  Respiratory Panel by RT PCR (Flu A&B, Covid) - Nasopharyngeal Swab     Status: None   Collection Time: 11/04/19  6:29 PM   Specimen: Nasopharyngeal Swab  Result Value Ref Range Status   SARS Coronavirus 2 by RT PCR NEGATIVE NEGATIVE Final    Comment: (NOTE) SARS-CoV-2 target nucleic acids are NOT DETECTED. The SARS-CoV-2 RNA is generally detectable in upper respiratoy specimens during the acute phase of infection. The lowest concentration of SARS-CoV-2 viral copies this assay can detect is 131 copies/mL. A negative result does not preclude SARS-Cov-2 infection and should not be used as the sole basis for treatment or other patient management decisions. A negative result may occur with  improper specimen collection/handling, submission of specimen other than nasopharyngeal swab, presence of viral mutation(s) within the areas targeted by this assay, and inadequate number of viral copies (<131  copies/mL). A negative result must be combined with clinical observations, patient history, and epidemiological information. The expected result is Negative. Fact Sheet for Patients:  https://www.moore.com/ Fact Sheet for Healthcare Providers:  https://www.young.biz/ This test is not yet ap proved or cleared by the [place] FDA and  has been authorized for detection and/or diagnosis of SARS-CoV-2 by FDA under an Emergency Use Authorization (EUA). This EUA will remain  in effect (meaning this test can be used) for the duration of the COVID-19 declaration under Section 564(b)(1) of the Act, 21 U.S.C. section 360bbb-3(b)(1), unless the authorization is terminated or revoked sooner.    Influenza A by PCR NEGATIVE NEGATIVE Final   Influenza B by PCR NEGATIVE NEGATIVE Final    Comment: (NOTE) The Xpert Xpress SARS-CoV-2/FLU/RSV assay is intended as an aid in  the diagnosis of influenza from Nasopharyngeal swab specimens and  should not be used as a sole basis for treatment. Nasal washings and  aspirates are unacceptable for Xpert Xpress SARS-CoV-2/FLU/RSV  testing. Fact Sheet for Patients: https://www.moore.com/ Fact Sheet for Healthcare Providers: https://www.young.biz/ This test is not yet approved or cleared by the [place] FDA and  has been authorized for detection and/or diagnosis of SARS-CoV-2 by  FDA under an Emergency Use Authorization (EUA). This EUA will remain  in effect (meaning this test can be used) for the duration of the  Covid-19 declaration under Section 564(b)(1) of the Act, 21  U.S.C. section 360bbb-3(b)(1), unless the authorization is  terminated or revoked. Performed at [facility], [address] [place], [place] [address]   Urine culture     Status: Abnormal   Collection Time: 11/05/19 12:04 AM   Specimen: Urine, Random  Result Value Ref Range Status    Specimen Description   Final    URINE, RANDOM Performed at [facility], [address], [place], [place] [address]    Special Requests   Final    NONE  Performed at [facility], [place] [address] [place], [place] [address]    Culture MULTIPLE SPECIES PRESENT, SUGGEST RECOLLECTION (A)  Final   Report Status 11/06/2019 FINAL  Final  SARS CORONAVIRUS 2 (TAT 6-24 HRS) Nasopharyngeal Nasopharyngeal Swab     Status: None   Collection Time: 11/09/19  1:49 AM   Specimen: Nasopharyngeal Swab  Result Value Ref Range Status   SARS Coronavirus 2 NEGATIVE NEGATIVE Final    Comment: (NOTE) SARS-CoV-2 target nucleic acids are NOT DETECTED. The SARS-CoV-2 RNA is generally detectable in upper and lower respiratory specimens during the acute phase of infection. Negative results do not preclude SARS-CoV-2 infection, do not rule out co-infections with other pathogens, and should not be used as the sole basis for treatment or other patient management decisions. Negative results must be combined with clinical observations, patient history, and epidemiological information. The expected result is Negative. Fact Sheet for Patients: SugarRoll.be Fact Sheet for Healthcare Providers: https://www.woods-mathews.com/ This test is not yet approved or cleared by the [place] FDA and  has been authorized for detection and/or diagnosis of SARS-CoV-2 by FDA under an Emergency Use Authorization (EUA). This EUA will remain  in effect (meaning this test can be used) for the duration of the COVID-19 declaration under Section 56 4(b)(1) of the Act, 21 U.S.C. section 360bbb-3(b)(1), unless the authorization is terminated or revoked sooner. Performed at [facility], [place] [address] [place], [place] [address]     Radiology Studies: No results found.  Darliss Cheney, MD [facility]  If 7PM-7AM, please contact  night-coverage [email or web address] 11/13/2019, 11:28 AM

## 2019-11-13 NOTE — Progress Notes (Signed)
Daughter, Tanzie Rothschild would like an update from GI when possible. She will be here this morning and hopes she will see GI MD.

## 2019-11-13 NOTE — Evaluation (Signed)
Physical Therapy Evaluation Patient Details Name: Amy Barnes MRN: [id number] DOB: [birth year] Today's Date: 11/13/2019   History of Present Illness  Pt is a 69 year old female with history of DM-2, HTN, HLD, asthma, emphysema, bipolar disorder, anxiety, depression, anemia, hypothyroidism and alcohol use presented to [facility] long ED with emesis and abdominal pain and admitted for non-STEMI and acute systolic CHF. GI consulted 2/12 due to recurrent nausea/vomiting and abdominal pain. S/p EGD on 2/13 with no abdominal pathology.  Clinical Impression  Pt admitted with above. Currently, pt denying abdominal pain or nausea. Reports chronic back pain which is exacerbated with ambulation. Pt ambulating 100 feet with a walker at a min guard assist level. SpO2 95% on RA at rest, desaturation to 87%, but quickly rebounded to 94% with seated rest break. HR stable. Displays generalized weakness and deconditioning. Will continue to follow acutely to progress mobility as tolerated.     Follow Up Recommendations No PT follow up;Supervision for mobility/OOB (pt/family not interested at this time)    Equipment Recommendations  None recommended by PT    Recommendations for Other Services       Precautions / Restrictions Precautions Precautions: Fall Restrictions Weight Bearing Restrictions: No      Mobility  Bed Mobility               General bed mobility comments: Sitting EOB on arrival  Transfers Overall transfer level: Needs assistance Equipment used: Rolling walker (2 wheeled) Transfers: Sit to/from Stand Sit to Stand: Min guard         General transfer comment: Increased time to rise  Ambulation/Gait Ambulation/Gait assistance: Min guard Gait Distance (Feet): 100 Feet Assistive device: Rolling walker (2 wheeled) Gait Pattern/deviations: Step-through pattern;Decreased stride length;Trunk flexed Gait velocity: decr   General Gait Details: Min guard for safety, increased  trunk/hip flexion. Fatigues easily  Financial trader Rankin (Stroke Patients Only)       Balance Overall balance assessment: Needs assistance Sitting-balance support: Feet supported Sitting balance-Leahy Scale: Good     Standing balance support: Bilateral upper extremity supported Standing balance-Leahy Scale: Poor Standing balance comment: reliant on external support in dynamic standing                             Pertinent Vitals/Pain Pain Assessment: Faces Faces Pain Scale: Hurts little more Pain Location: back (chronic) Pain Descriptors / Indicators: Discomfort Pain Intervention(s): Limited activity within patient's tolerance;Monitored during session    Home Living Family/patient expects to be discharged to:: Private residence Living Arrangements: Spouse/significant other;Children(daughter, grandchildren) Available Help at Discharge: Family;Friend(s) Type of Home: House Home Access: Ramped entrance     Home Layout: Two level Home Equipment: Environmental consultant - 2 wheels;Cane - single point;Bedside commode;Shower seat;Electric scooter;Wheelchair - Tree surgeon - 4 wheels      Prior Function Level of Independence: Needs assistance   Gait / Transfers Assistance Needed: Uses dual canes for indoor ambulation, Rollator for outdoor  ADL's / Homemaking Assistance Needed: requires assist for some ADL's when pain is limiting        Hand Dominance   Dominant Hand: Right    Extremity/Trunk Assessment   Upper Extremity Assessment Upper Extremity Assessment: Generalized weakness    Lower Extremity Assessment Lower Extremity Assessment: Generalized weakness    Cervical / Trunk Assessment Cervical / Trunk Assessment: Normal  Communication   Communication: No difficulties  Cognition Arousal/Alertness: Awake/alert Behavior During Therapy: WFL for tasks assessed/performed Overall Cognitive Status: Within  Functional Limits for tasks assessed                                        General Comments      Exercises     Assessment/Plan    PT Assessment Patient needs continued PT services  PT Problem List Decreased strength;Decreased mobility;Decreased activity tolerance;Decreased balance;Decreased knowledge of use of DME;Pain;Obesity       PT Treatment Interventions DME instruction;Therapeutic activities;Gait training;Therapeutic exercise;Patient/family education;Functional mobility training;Balance training    PT Goals (Current goals can be found in the Care Plan section)  Acute Rehab PT Goals Patient Stated Goal: return home PT Goal Formulation: With patient Time For Goal Achievement: 11/27/19 Potential to Achieve Goals: Good    Frequency Min 3X/week   Barriers to discharge        Co-evaluation               AM-PAC PT "6 Clicks" Mobility  Outcome Measure Help needed turning from your back to your side while in a flat bed without using bedrails?: None Help needed moving from lying on your back to sitting on the side of a flat bed without using bedrails?: A Little Help needed moving to and from a bed to a chair (including a wheelchair)?: A Little Help needed standing up from a chair using your arms (e.g., wheelchair or bedside chair)?: A Little Help needed to walk in hospital room?: A Little Help needed climbing 3-5 steps with a railing? : A Lot 6 Click Score: 18    End of Session   Activity Tolerance: Patient tolerated treatment well Patient left: in bed;with call bell/phone within reach;with family/visitor present Nurse Communication: Mobility status PT Visit Diagnosis: Muscle weakness (generalized) (M62.81);Difficulty in walking, not elsewhere classified (R26.2)    Time: 9628-3662 PT Time Calculation (min) (ACUTE ONLY): 18 min   Charges:   PT Evaluation $PT Eval Moderate Complexity: 1 Mod            Lillia Pauls, PT, DPT Acute  Rehabilitation Services Pager [phone] Office [phone]   Norval Morton 11/13/2019, 1:25 PM

## 2019-11-14 ENCOUNTER — Inpatient Hospital Stay (HOSPITAL_COMMUNITY): Payer: Medicare Other

## 2019-11-14 DIAGNOSIS — I5181 Takotsubo syndrome: Secondary | ICD-10-CM

## 2019-11-14 LAB — RENAL FUNCTION PANEL
Albumin: 2.9 g/dL — ABNORMAL LOW (ref 3.5–5.0)
Anion gap: 10 (ref 5–15)
BUN: 10 mg/dL (ref 8–23)
CO2: 30 mmol/L (ref 22–32)
Calcium: 8.8 mg/dL — ABNORMAL LOW (ref 8.9–10.3)
Chloride: 96 mmol/L — ABNORMAL LOW (ref 98–111)
Creatinine, Ser: 0.71 mg/dL (ref 0.44–1.00)
GFR calc Af Amer: >60 mL/min (ref >60)
GFR calc non Af Amer: >60 mL/min (ref >60)
Glucose, Bld: 130 mg/dL — ABNORMAL HIGH (ref 70–99)
Phosphorus: 4.2 mg/dL (ref 2.5–4.6)
Potassium: 4 mmol/L (ref 3.5–5.1)
Sodium: 136 mmol/L (ref 135–145)

## 2019-11-14 LAB — TROPONIN I (HIGH SENSITIVITY): Troponin I (High Sensitivity): 46 ng/L — ABNORMAL HIGH (ref <18)

## 2019-11-14 LAB — CBC
HCT: 32.4 % — ABNORMAL LOW (ref 36.0–46.0)
Hemoglobin: 10.4 g/dL — ABNORMAL LOW (ref 12.0–15.0)
MCH: 28.4 pg (ref 26.0–34.0)
MCHC: 32.1 g/dL (ref 30.0–36.0)
MCV: 88.5 fL (ref 80.0–100.0)
Platelets: 211 10*3/uL (ref 150–400)
RBC: 3.66 MIL/uL — ABNORMAL LOW (ref 3.87–5.11)
RDW: 15.9 % — ABNORMAL HIGH (ref 11.5–15.5)
WBC: 6.8 10*3/uL (ref 4.0–10.5)
nRBC: 0 % (ref 0.0–0.2)

## 2019-11-14 LAB — GLUCOSE, CAPILLARY
Glucose-Capillary: 128 mg/dL — ABNORMAL HIGH (ref 70–99)
Glucose-Capillary: 110 mg/dL — ABNORMAL HIGH (ref 70–99)
Glucose-Capillary: 97 mg/dL (ref 70–99)

## 2019-11-14 LAB — MAGNESIUM: Magnesium: 2 mg/dL (ref 1.7–2.4)

## 2019-11-14 MED ORDER — SACUBITRIL-VALSARTAN 24-26 MG PO TABS: 1.0000 | ORAL_TABLET | Freq: Two times a day (BID) | ORAL | Status: DC

## 2019-11-14 MED ORDER — POLYETHYLENE GLYCOL 3350 17 G PO PACK: 17.0000 g | PACK | Freq: Two times a day (BID) | ORAL | 0 refills | Status: DC | PRN

## 2019-11-14 MED ORDER — TECHNETIUM TC 99M SULFUR COLLOID
2.0000 | Freq: Once | INTRAVENOUS | Status: AC | PRN
  Administered 2019-11-14: 2 via INTRAVENOUS

## 2019-11-14 MED ORDER — FUROSEMIDE 20 MG PO TABS: 20.0000 mg | ORAL_TABLET | Freq: Every day | ORAL | 0 refills | Status: DC

## 2019-11-14 MED ORDER — CARVEDILOL 6.25 MG PO TABS: 6.2500 mg | ORAL_TABLET | Freq: Two times a day (BID) | ORAL | 0 refills | Status: DC

## 2019-11-14 MED ORDER — ONDANSETRON 8 MG PO TBDP: 8.0000 mg | ORAL_TABLET | Freq: Two times a day (BID) | ORAL | 0 refills | Status: DC | PRN

## 2019-11-14 MED ORDER — SACUBITRIL-VALSARTAN 24-26 MG PO TABS: 1.0000 | ORAL_TABLET | Freq: Two times a day (BID) | ORAL | 0 refills | Status: DC

## 2019-11-14 NOTE — TOC Benefit Eligibility Note (Signed)
Transition of Care [facility]) Benefit Eligibility Note    Patient Details  Name: Amy Barnes MRN: [id number] Date of Birth: 16-Oct-1950   Medication/Dose: Delene Loll   24-26 MG PO BID  Covered?: Yes  Tier: 3 Drug  Prescription Coverage Preferred Pharmacy: [facility]  Spoke with Person/Company/Phone Number:: Dub Mikes  @ [facility] [place] # [phone] OPT-2  Co-Pay: $ 277.17  Prior Approval: No  Deductible: Unmet(OUT-OF-POCKET- NOT MET)       Memory Argue Phone Number: 11/14/2019, 5:15 PM

## 2019-11-14 NOTE — Progress Notes (Addendum)
Progress Note  Patient Name: Amy Barnes Date of Encounter: 11/14/2019  Primary Cardiologist: Minus Breeding, MD   Subjective   Pt was out of the room for testing all day, has just returned. She denies chest discomfort, still has DOE. She is still having abdominal pain and not able to eat much.   Inpatient Medications    Scheduled Meds: . carvedilol  6.25 mg Oral BID  . doxycycline  100 mg Oral Daily  . feeding supplement (ENSURE ENLIVE)  237 mL Oral BID BM  . FLUoxetine  40 mg Oral Daily  . folic acid  1 mg Oral Daily  . furosemide  20 mg Oral Daily  . insulin aspart  0-9 Units Subcutaneous Q4H  . lamoTRIgine  200 mg Oral Daily  . levothyroxine  150 mcg Oral QHS  . losartan  25 mg Oral BID  . mometasone-formoterol  2 puff Inhalation BID  . montelukast  10 mg Oral QHS  . multivitamin with minerals  1 tablet Oral Daily  . OXcarbazepine  300 mg Oral Daily   And  . OXcarbazepine  600 mg Oral QHS  . pantoprazole  40 mg Oral BID  . simvastatin  40 mg Oral QPM  . sodium chloride flush  3 mL Intravenous Q12H  . sodium chloride flush  3 mL Intravenous Q12H  . sodium chloride flush  3 mL Intravenous Q12H  . sucralfate  1 g Oral TID WC & HS  . thiamine  100 mg Oral Daily   Or  . thiamine  100 mg Intravenous Daily   Continuous Infusions: . sodium chloride    . sodium chloride     PRN Meds: sodium chloride, sodium chloride, acetaminophen, albuterol, diazepam, magnesium hydroxide, ondansetron (ZOFRAN) IV, polyethylene glycol, prochlorperazine, senna-docusate, sodium chloride flush, sodium chloride flush, sodium phosphate   Vital Signs    Vitals:   11/13/19 2035 11/13/19 2104 11/14/19 0448 11/14/19 0804  BP: (!) 135/59 (!) 135/59    Pulse: 71 67  71  Resp:  18  16  Temp: 98.1 F (36.7 C)  97.9 F (36.6 C)   TempSrc: Oral  Oral   SpO2: 97% 99%  100%  Weight:   (!) 168.9 kg   Height:        Intake/Output Summary (Last 24 hours) at 11/14/2019 0852 Last data filed at  11/13/2019 1748 Gross per 24 hour  Intake 938 ml  Output --  Net 938 ml   Last 3 Weights 11/14/2019 11/13/2019 11/12/2019  Weight (lbs) 372 lb 6.4 oz 370 lb 13 oz 372 lb 12.8 oz  Weight (kg) 168.92 kg 168.2 kg 169.1 kg      Telemetry    Sinus rhythm in the 60s- Personally Reviewed  ECG    No new tracings for review  Physical Exam   GEN: Obese female. No acute distress.   Neck: No JVD Cardiac: RRR, no murmurs, rubs, or gallops.  Respiratory: Clear to auscultation bilaterally. GI: Soft, nontender, non-distended  MS: Difficult to assess edema due to large body habitus Neuro:  Nonfocal  Psych: Normal affect   Labs    High Sensitivity Troponin:   Recent Labs  Lab 11/08/19 2057 11/08/19 2309 11/09/19 0230 11/09/19 0341 11/09/19 1058  TROPONINIHS 106* 193* 309* 318* 393*      Chemistry Recent Labs  Lab 11/08/19 2057 11/10/19 0353 11/12/19 0426 11/13/19 0802 11/14/19 0347  NA 138   < > 137 135 136  K 4.2   < >  4.1 3.9 4.0  CL 100   < > 100 100 96*  CO2 26   < > 29 28 30   GLUCOSE 217*   < > 136* 120* 130*  BUN 11   < > 9 8 10   CREATININE 0.56   < > 0.62 0.57 0.71  CALCIUM 9.0   < > 8.7* 8.6* 8.8*  PROT 7.4  --   --   --   --   ALBUMIN 4.2  --  2.9* 3.0* 2.9*  AST 28  --   --   --   --   ALT 32  --   --   --   --   ALKPHOS 83  --   --   --   --   BILITOT 0.6  --   --   --   --   GFRNONAA >60   < > >60 >60 >60  GFRAA >60   < > >60 >60 >60  ANIONGAP 12   < > 8 7 10    < > = values in this interval not displayed.     Hematology Recent Labs  Lab 11/12/19 0426 11/13/19 0802 11/14/19 0347  WBC 6.9 6.4 6.8  RBC 3.61* 3.62* 3.66*  HGB 10.2* 10.1* 10.4*  HCT 31.8* 31.7* 32.4*  MCV 88.1 87.6 88.5  MCH 28.3 27.9 28.4  MCHC 32.1 31.9 32.1  RDW 15.6* 15.5 15.9*  PLT 196 212 211    BNP Recent Labs  Lab 11/09/19 0504 11/11/19 0321  BNP 565.6* 143.6*     DDimer  Recent Labs  Lab 11/08/19 2307  DDIMER 2.09*     Radiology    No results  found.  Cardiac Studies   LEFT HEART CATH AND CORONARY ANGIOGRAPHY 11/10/19  Conclusion Flouroscopy reveals extensive mitral annular calcification.  Normal epicardial coronary arteries.  LV EDP 23 mmHg  RECOMMENDATION: Medical therapy for multiple risk factors.    Echocardiogram 11/09/19  1. There is akinesis of the apical septal, mid and apical lateral and  inferolateral and apical walls. . Left ventricular ejection fraction, by  estimation, is 35 to 40%. The left ventricle has moderately decreased  function. The left ventrical  demonstrates regional wall motion abnormalities. There is mildly increased  concentric left ventricular hypertrophy. Left ventricular diastolic  parameters are consistent with Grade I diastolic dysfunction (impaired  relaxation). Elevated left ventricular  end-diastolic pressure.  2. Right ventricular systolic function is normal. The right ventricular  size is normal. Tricuspid regurgitation signal is inadequate for assessing  PA pressure.  3. Severe mitral annular calcification of the posterior mitral valve  annulus. No evidence of mitral valve regurgitation. No evidence of mitral  stenosis.  4. The aortic valve is normal in structure and function. Aortic valve  regurgitation is not visualized. No aortic stenosis is present.  5. The inferior vena cava is normal in size with <50% respiratory  variability, suggesting right atrial pressure of 8 mmHg.   Patient Profile     69 y.o. female with a hx of morbid obesity, anemia, anxiety, asthma, emphysema, depression, bipolar affective disorder, previously diet controlled DM 2 (hemoglobin A1c 7.2 this admission), hypertension, hyperlipidemia and hypothyroidism who is being seen for elevated troponin and found to have newly reduced EF. Cath on 11/10/19 showed normal coronary arteries.   Assessment & Plan    Acute systolic heart failure/DCM -Echo on 11/09/2019 with finding of newly reduced EF 35-40%.   Nonischemic as cardiac cath on 2/11 showed normal coronary  arteries. -BNP [id number].  Chest CT showed concern for CHF.  Difficult to assess volume status due to morbid obesity. -Patient has been started on low-dose beta-blocker with carvedilol 6.25 mg twice daily and ARB with losartan 25 mg daily.  Lasix 20 mg initiated yesterday. -I&O's not accurate. Wt has fluctuated.   Elevated troponin -High-sensitivity troponin up to 393.  D-dimer was 2.09.  CTA of the chest was negative for PE but concerning for CHF. -Abnormality is likely related to demand ischemia in setting of acute CHF and acute respiratory failure with hypoxia.  OSA -Pt was diagnosed about 20 years ago in [place]. She still has CPAP but is not using as she is unable to get supplies any longer. She likely needs a new sleep study and get established treatment here in [place].  -We discussed the negative effects of untreated OSA on the heart and other organs.   Prolonged QTC with LBBB -Improved on EKG 2/12.  Not felt to be worsened in the setting of LBBB.  Nausea/vomiting -Patient had EGD on 2/13 showing no cause for symptoms identified. -Pt had gastric emptying study today. -Still having abdominal pain and poor oral intake.   Hypertension -Blood pressure has been improving, 135/59 last night.  For questions or updates, please contact [facility] Please consult [email or web address] for contact info under        Signed, Berton Bon, NP  11/14/2019, 8:52 AM     Attending Note:   The patient was seen and examined.  Agree with assessment and plan as noted above.  Changes made to the above note as needed.  Patient seen and independently examined with  Lizabeth Leyden, NP .   We discussed all aspects of the encounter. I agree with the assessment and plan as stated above.  1.   Acute combined systolic and diastolic congestive heart failure: The patient presents with lots of GI issues that have been going on for the past 3 to 4  weeks.  She has had very poor p.o. intake and has had a very stressful month.  She was found to have elevated troponin levels and an echocardiogram revealed akinesis of the apex.  Heart catheterization revealed smooth and normal coronary arteries.  She had an echocardiogram a several weeks ago that revealed normal left ventricular systolic function.  I suspect that were dealing with stress-induced cardiomyopathy, also known as Takotsubo syndrome.  This should be reversible.  We will treat her with carvedilol and will start her on Entresto 24-26 mg twice a day.  Sherryll Burger was quite a bit stronger than the losartan that she was on and will need to watch for signs of hypotension.  If she is hypotensive I have instructed the daughter to hold the Lasix if her systolic blood pressure is less than 100.  I have reviewed the coronary angiograms, her 2 most recent echo, lab date   She will follow-up with Dr. Antoine Poche or the APP in 2 to 3 weeks in the office.  We will plan on getting a basic metabolic profile at that time.  Continue carvedilol.  Lasix will be 20 to 40 mg as needed.  I had a long discussion with the daughter who has  helped to manage her mother's medications and she is has a very good understanding of her mother's medications and when to hold various medications.  2.  Obstructive sleep apnea: Mrs. Christiano has a history of obstructive sleep apnea.  Unfortunately that was [place] another state and her benefits did  not transfer.  She will need to be referred to a new sleep doctor.  I would suggest either Dr. Mayford Knife or Dr. Tresa Endo in our group as a starting place.  3.  Morbid obesity: The patient definitely needs to lose weight.  4.  Gastroparesis: The patient has had problems with gastroparesis for the past several months.  She was tried on Detrol which can cause constipation and other various GI issues.  She has stopped the Detrol  I have discussed the case with Dr. Jacqulyn Bath.   A high level of decision  making was required in the delivery of care in this case as well as  prolonged discussion with the duaghter , attending and nursing staff and case manager    I have spent a total of 40 minutes with patient reviewing hospital  notes , telemetry, EKGs, labs and examining patient as well as establishing an assessment and plan that was discussed with the patient. > 50% of time was spent in direct patient care.    Vesta Mixer, Montez Hageman., MD, [facility] 11/14/2019, 4:26 PM [address]  [address] Office [phone] Pager [phone]

## 2019-11-14 NOTE — Consult Note (Signed)
[facility] CM Inpatient Consult   11/14/2019  Amy Barnes 11-21-1950 [id number]   Patientwas reviewedfor7 day and30-day readmissions with 29% high risk score for unplanned readmission, 2 hospitalizations and 2 ED visits in the past 6 months; and to check forpotentialneeds of[facility]) Care Management servicesunder her Medicare/ [employer] insurancebenefit.   Perchart review and MDbrief narrative reveals as follows:   69 year old female with history of DM-2, HTN, HLD, asthma, emphysema, bipolar disorder, anxiety, depression, anemia, hypothyroidism and alcohol use, presented to [facility] ED with emesis and abdominal pain. Admitted for non-STEMI and acute systolic CHF. She underwent Left cardiac cath and EGD. Her acute systolic congestive heart failure was newly diagnosed and she was started on carvedilol, losartan, Lasix and Entresto.  Calledpatientandwas able to speak toherand her daughter Amy Barnes) over the phone. HIPAA verification obtained.Patient reportsthat plan is to discharge home.She gave permission to speak to her daughter Amy Barnes). [facility] care management services discussed with patient's daughter.  Shehasindicatedhaving nocurrent needs regarding medications(daughter manages);pharmacy (uses [facility] in [place]); transportation(provided by husband/daughter/niece). Patient has the support and assistance of her husband, daughter, niece and grandchildren who live with her at home.   Patient's daughter reports having weighing scale at home but patient has not regularly checked her weight. Daughter verbalized checking blood sugar and blood pressure of patient. She admits lack of knowledge and needing assistance with ways to manage new diagnosis of HF.   Patient endorses her primary care provider as [name], MD with [place], listed to provide transition of care follow-up.  Patient/ daughter agreed to  [facility] care management services withfollow-up calls from [facility] RN CM coordinator for complex disease management of new dx.HF and to assess further needs post discharge.   Transition of care CM note reviewed shows that patient is from home and will transition home at discharge.     Of note, [facility] Care Management services does not replace or interfere with any services that are arranged by transition of care case management or social work.    For questions and additional information, please contact:  Corinthian Mizrahi A. Akon Reinoso, BSN, RN-BC [facility] Liaison Cell: [phone]

## 2019-11-14 NOTE — Progress Notes (Signed)
UNASSIGNED PATIENT Subjective: Amy Barnes is a 69 year old [place] female with multiple medical problems was admitted with a possible drug reaction to Detrol..She continues to complain of left lower quadrant pain worse when she is constipated.  Symptoms improved after she has a bowel movement.  She denies having any nausea vomiting.  She had a normal gastric emptying study done today.  A CT scan of the abdomen pelvis done on the ninth of this month revealed bibasilar interstitial edema with is stable fat-containing umbilical hernia and 2 mm punctate nonobstructing right renal calculi.. A CT angiogram done on the 10th revealed no evidence of acute pulmonary obstruction three-vessel coronary calcification with left ventricle calcification was noted along with lipomatous hypertrophy of the intra-atrial atrial septum and aortic atherosclerosis.  Scattered borderline enlarged mediastinal lymph nodes were noted considered to be reactive in nature there was widespread multifocal areas of mixed groundglass and consolidation in the background of interlobular septal thickening with small bilateral pleural effusions right more than left suggestive of CHF.  Patient is overall feeling well today she rates her left lower quadrant pain at a 4-5 out of a 10.  She denies having any melena or hematochezia.  She had a colonoscopy in the remote past by Dr. In Drexel at [facility] but that gastroenterologist has since retired.  She has an appointment with another gastroenterologist at [facility] in the near future.  Her daughter who is at her bedside understands and agrees with this idea.  Objective: Vital signs in last 24 hours: Temp:  [97.9 F (36.6 C)-98.1 F (36.7 C)] 97.9 F (36.6 C) (02/15 0448) Pulse Rate:  [67-71] 71 (02/15 0804) Resp:  [16-18] 16 (02/15 0804) BP: (135)/(59) 135/59 (02/14 2104) SpO2:  [97 %-100 %] 100 % (02/15 0804) Weight:  [168.9 kg] 168.9 kg (02/15 0448) Last BM Date:  11/13/19  Intake/Output from previous day: 02/14 0701 - 02/15 0700 In: 938 [P.O.:938] Out: -  Intake/Output this shift: No intake/output data recorded.  General appearance: alert, cooperative, appears stated age, no distress and morbidly obese Resp: clear to auscultation bilaterally Cardio: regular rate and rhythm, S1, S2 normal, no murmur, click, rub or gallop GI: soft, orbitally obese non-tender; bowel sounds normal; no masses,  no organomegaly Extremities: extremities normal, atraumatic, no cyanosis or edema; patient has significant tenderness on palpation over the left hip  Lab Results: Recent Labs    11/12/19 0426 11/13/19 0802 11/14/19 0347  WBC 6.9 6.4 6.8  HGB 10.2* 10.1* 10.4*  HCT 31.8* 31.7* 32.4*  PLT 196 212 211   BMET Recent Labs    11/12/19 0426 11/13/19 0802 11/14/19 0347  NA 137 135 136  K 4.1 3.9 4.0  CL 100 100 96*  CO2 29 28 30   GLUCOSE 136* 120* 130*  BUN 9 8 10   CREATININE 0.62 0.57 0.71  CALCIUM 8.7* 8.6* 8.8*   LFT Recent Labs    11/14/19 0347  ALBUMIN 2.9*   Studies/Results: NM GASTRIC EMPTYING SOLID LIQUID BOTH W/SB TRANSIT  Result Date: 11/14/2019 CLINICAL DATA:  Abdominal pain and vomiting EXAM: NUCLEAR MEDICINE GASTRIC EMPTYING SCAN TECHNIQUE: After oral ingestion of radiolabeled meal, sequential abdominal images were obtained for 4 hours. Percentage of activity emptying the stomach was calculated at 1 hour, 2 hour, 3 hour, and 4 hours. RADIOPHARMACEUTICALS:  2.0 mCi Tc-15m sulfur colloid in standardized meal COMPARISON:  11/08/2019 FINDINGS: Expected location of the stomach in the left upper quadrant. Ingested meal empties the stomach gradually over the course of the study.  22.7% emptied at 1 hr ( normal >= 10%) 50.5% emptied at 2 hr ( normal >= 40%) 73.3% emptied at 3 hr ( normal >= 70%) 81.5% emptied at 4 hr ( normal >= 90%) IMPRESSION: Normal gastric emptying study. The mildly decreased emptying at 4 hours is nonspecific, and based on  earlier measurements normal gastric emptying is noted. Electronically Signed   By: Randa Ngo M.D.   On: 11/14/2019 15:13   Medications: I have reviewed the patient's current medications.  Assessment/Plan: 1) Left lower quadrant pain/constipation-liberal fluid intake OTC stool softeners have been recommended. I have encouraged her to follow-up with her gastroenterologist at [facility] as it is closer to her home.  2) Left hip pain-patient is very tender over the left hip joint; she has been advised to discuss this with her PCP Dr. Danise Mina. 3) Non-STEMI. 4) AODM/HTN/HLD/Morbid obesity. 5) Asthma/emphysema. 6) Bipolar disorder/depression/anxiety disorder/alcohol use. 7) Hypothyroidism. 8) Umbilical hernia containing fat.  LOS: 5 days   Juanita Craver 11/14/2019, 3:24 PM

## 2019-11-14 NOTE — Discharge Summary (Signed)
Physician Discharge Summary  BLAIR MESINA ZOX:[id number] DOB: 1951-06-16 DOA: 11/08/2019  PCP: [name], MD  Admit date: 11/08/2019 Discharge date: 11/14/2019  Admitted From: Home Disposition: Home  Recommendations for Outpatient Follow-up:  1. Follow up with PCP in 1-2 weeks 2. Follow with GI in 2 to 4 weeks 3. Follow with cardiology per their recommendations and schedule 4. Please obtain BMP/CBC in one week 5. Please follow up on the following pending results:  Home Health: None Equipment/Devices: None  Discharge Condition: Stable CODE STATUS: Full code Diet recommendation: Cardiac/low-sodium  Subjective: Seen and examined after the test today.  Overall feels better but still complains of some nausea but has not had any vomiting since yesterday.  Concerned about constipation.  Brief/Interim Summary: 69 year old female with history of DM-2, HTN, HLD, asthma, emphysema, bipolar disorder, anxiety, depression, anemia, hypothyroidism and alcohol use presented to [facility] long ED with emesis and abdominal pain and admitted for non-STEMI and acute systolic CHF.  In ED, slightly tachycardic.  Slightly hypertensive.  88% on room air requiring supplemental oxygen.  High-sensitivity troponin 106>> 309.  D-dimer 2.09.  EKG with LBBB (chronic).  CTA chest negative for PE but concerning for CHF.  Patient had acute chest pain and shortness of breath on the way back from CT imaging.  Cardiology consulted and started patient on heparin drip.  She was then transferred to [facility] for cardiac catheterization.  Echocardiogram with EF of 35 to 40%, G1 DD, RWMA and severe mitral valve calcification without stenosis. LHC on 2/11 with normal coronaries.   GI consulted 2/12 due to recurrent nausea/vomiting and abdominal pain.  Status post EGD on 11/12/2019 with no abdominal pathology.  Due to abdominal pain, CT abdomen and pelvis was obtained on 11/13/2019 which also did not reveal any acute pathology.   Patient was managed conservatively.  Due to her diabetes, she also underwent gastric emptying study which was also unremarkable.  Her hypoxia was only brief which has resolved and she has been doing fine on room air.  She likely has obstructive sleep apnea and we would recommend outpatient sleep study for that.  Her acute systolic congestive heart failure was newly diagnosed.  She was started on carvedilol, losartan and Lasix here.  On the day of discharge, patient was once again seen by cardiology/Dr. Elease Hashimoto who recommended discharging patient on carvedilol and Lasix and also started patient on Entresto starting tomorrow.  He personally discussed with patient and her daughter and educated them not to take Lasix if patient's blood pressure drops less than 100 systolic since now she is going to be on Entresto.  Patient underwent extensive work-up for her abdominal pain and nausea.  Currently she has no vomiting but still complains of intermittent pain and some nausea but she is able to tolerate diet.  When discussed about discharging home today, initially patient's daughter expressed some hesitation and the main concern was her constipation.  I did explain to her that patient may continue Colace, senna and MiraLAX at home and that she is already on 3 different medications which are Robaxin, oxycodone and tramadol which are likely contributing to her constipation and they should be avoided.  She verbalized understanding.  She requested prescribing her Zofran which has been done.  Patient is being discharged in a stable condition.  Discharge Diagnoses:  Principal Problem:   NSTEMI (non-ST elevated myocardial infarction) [facility]) Active Problems:   Acute respiratory failure with hypoxia (HCC)   Acute pulmonary edema (HCC)   Abdominal  pain with vomiting   QT prolongation   Elevated troponin    Discharge Instructions  Discharge Instructions    Discharge patient   Complete by: As directed    Discharge  disposition: 01-Home or Self Care   Discharge patient date: 11/14/2019     Allergies as of 11/14/2019      Reactions   Cephalexin Hives   Hydrocodone Other (See Comments)   Reaction:  Hallucinations    Tolterodine Tartrate Nausea And Vomiting   Risperidone And Related Other (See Comments)   Reaction:  Made pt excessively sleepy   Seroquel [quetiapine Fumarate] Other (See Comments)   Reaction:  Made pt excessively sleepy   Sulfa Antibiotics Rash      Medication List    STOP taking these medications   celecoxib 200 MG capsule Commonly known as: CELEBREX   doxycycline 100 MG tablet Commonly known as: VIBRA-TABS     TAKE these medications   albuterol 108 (90 Base) MCG/ACT inhaler Commonly known as: VENTOLIN HFA Inhale 2 puffs into the lungs every 6 (six) hours as needed for wheezing or shortness of breath.   aspirin 81 MG EC tablet Take 81 mg by mouth at bedtime.   budesonide-formoterol 160-4.5 MCG/ACT inhaler Commonly known as: SYMBICORT Inhale 2 puffs into the lungs 2 (two) times daily.   carvedilol 6.25 MG tablet Commonly known as: COREG Take 1 tablet (6.25 mg total) by mouth 2 (two) times daily.   docusate sodium 100 MG capsule Commonly known as: COLACE Take 1 capsule (100 mg total) by mouth 2 (two) times daily.   FLUoxetine 40 MG capsule Commonly known as: PROZAC Take by mouth daily.   furosemide 20 MG tablet Commonly known as: LASIX Take 1 tablet (20 mg total) by mouth daily. Start taking on: November 15, 2019   lamoTRIgine 200 MG tablet Commonly known as: LAMICTAL Take 200 mg by mouth daily.   levothyroxine 150 MCG tablet Commonly known as: SYNTHROID Take 1 tablet (150 mcg total) by mouth at bedtime.   methocarbamol 750 MG tablet Commonly known as: Robaxin-750 Take 1 tablet (750 mg total) by mouth every 6 (six) hours as needed for muscle spasms.   montelukast 10 MG tablet Commonly known as: SINGULAIR TAKE 1 TABLET BY MOUTH EVERYDAY AT  BEDTIME What changed: See the new instructions.   multivitamin with minerals Tabs tablet Take 2 tablets by mouth daily.   nystatin powder Commonly known as: MYCOSTATIN/NYSTOP Apply topically 4 (four) times daily. What changed:   how much to take  when to take this  reasons to take this   ondansetron 8 MG disintegrating tablet Commonly known as: Zofran ODT Take 1 tablet (8 mg total) by mouth 2 (two) times daily as needed for nausea or vomiting.   Oxcarbazepine 300 MG tablet Commonly known as: TRILEPTAL Take 300-600 mg by mouth See admin instructions. Take 300 mg by mouth in the morning and 600 mg at night   oxybutynin 10 MG 24 hr tablet Commonly known as: DITROPAN-XL Take 1 tablet (10 mg total) by mouth daily.   Oxycodone HCl 10 MG Tabs Take 1 tablet (10 mg total) by mouth every 4 (four) hours as needed for severe pain ((score 7 to 10)).   pantoprazole 40 MG tablet Commonly known as: PROTONIX Take 1 tablet (40 mg total) by mouth daily. What changed: when to take this   polyethylene glycol 17 g packet Commonly known as: MIRALAX / GLYCOLAX Take 17 g by mouth 2 (two) times daily  as needed for mild constipation.   promethazine 25 MG suppository Commonly known as: Phenergan Place 1 suppository (25 mg total) rectally every 6 (six) hours as needed for nausea or vomiting.   sacubitril-valsartan 24-26 MG Commonly known as: ENTRESTO Take 1 tablet by mouth 2 (two) times daily. Start taking on: November 15, 2019   simvastatin 40 MG tablet Commonly known as: ZOCOR Take 40 mg by mouth every evening.   sucralfate 1 g tablet Commonly known as: Carafate Take 1 tablet (1 g total) by mouth 4 (four) times daily -  with meals and at bedtime.   traMADol 50 MG tablet Commonly known as: ULTRAM Take 50-100 mg by mouth every 6 (six) hours as needed for moderate pain.   Vitamin D3 25 MCG (1000 UT) Caps Take 1 capsule (1,000 Units total) by mouth daily.      Follow-up  Information    Jodelle Gross, NP Follow up.   Specialties: Nurse Practitioner, Radiology, Cardiology Why: Cardiology hospital follow up on 12/05/19 at 2:45. Please arrive 15 minutes early for check in.  Contact information: [address] [place] [address] [phone]        [name], MD Follow up in 1 week(s).   Specialty: Family Medicine Contact information: [address] [place] [address] [phone]        Rollene Rotunda, MD .   Specialty: Cardiology Contact information: [address] [place] [address] [place] [address] [phone]        Jeani Hawking, MD Follow up in 2 week(s).   Specialty: Gastroenterology Contact information: [address] [place] [address] [phone]          Allergies  Allergen Reactions  . Cephalexin Hives  . Hydrocodone Other (See Comments)    Reaction:  Hallucinations   . Tolterodine Tartrate Nausea And Vomiting  . Risperidone And Related Other (See Comments)    Reaction:  Made pt excessively sleepy  . Seroquel [Quetiapine Fumarate] Other (See Comments)    Reaction:  Made pt excessively sleepy  . Sulfa Antibiotics Rash    Consultations: Cardiology and GI   Procedures/Studies: CT ABDOMEN PELVIS WO CONTRAST  Result Date: 11/08/2019 CLINICAL DATA:  Nausea, vomiting, abdominal pain for 2 weeks EXAM: CT ABDOMEN AND PELVIS WITHOUT CONTRAST TECHNIQUE: Multidetector CT imaging of the abdomen and pelvis was performed following the standard protocol without IV contrast. COMPARISON:  10/31/2019 FINDINGS: Lower chest: Left ventricular calcifications are unchanged. No pericardial effusion. Bibasilar interlobular septal thickening and vascular prominence consistent with interstitial edema. Hepatobiliary: No focal liver abnormality is seen. No gallstones, gallbladder wall thickening, or biliary dilatation. Pancreas: Unremarkable. No pancreatic ductal dilatation or surrounding  inflammatory changes. Spleen: Normal in size without focal abnormality. Adrenals/Urinary Tract: There is a punctate less than 2 mm nonobstructing calculus right kidney, reference image 44 series 2. No obstructive uropathy. The left kidney is unremarkable. Bladder is grossly normal. The adrenals are unremarkable. Stomach/Bowel: No bowel obstruction or ileus. Normal appendix right lower quadrant. Vascular/Lymphatic: Mild calcified plaque within the distal aorta at the bifurcation. No pathologic adenopathy within the abdomen or pelvis. Reproductive: Status post hysterectomy. No adnexal masses. Other: Small fat containing umbilical hernia unchanged. No free fluid or free gas within the abdomen or pelvis. Musculoskeletal: Left hip arthroplasty is identified. Postsurgical changes lower lumbar spine. No acute or destructive bony lesions. IMPRESSION: 1. Punctate 2 mm nonobstructing right renal calculus. 2. Bibasilar interstitial edema. 3. Stable fat containing umbilical hernia. Electronically Signed  By: Sharlet Salina M.D.   On: 11/08/2019 21:22   CT Angio Chest PE W and/or Wo Contrast  Result Date: 11/09/2019 CLINICAL DATA:  Shortness of breath, elevated troponin and D-dimer EXAM: CT ANGIOGRAPHY CHEST WITH CONTRAST TECHNIQUE: Multidetector CT imaging of the chest was performed using the standard protocol during bolus administration of intravenous contrast. Multiplanar CT image reconstructions and MIPs were obtained to evaluate the vascular anatomy. CONTRAST:  OMNIPAQUE IOHEXOL 350 MG/ML SOLN COMPARISON:  CT chest 08/29/2019 FINDINGS: Cardiovascular: Satisfactory opacification the pulmonary arteries to the segmental level. No pulmonary artery filling defects are identified. Central pulmonary arteries are normal caliber. Cardiac size is borderline enlarged. There is dense calcification of mitral annulus. Left ventricular calcification likely reflects sequela of prior infarct. There is lipomatous hypertrophy of  the intra-atrial septum. Three-vessel coronary artery calcifications are noted. Atherosclerotic plaque within the normal caliber aorta. Minimal plaque within the normally branching great vessels. Mediastinum/Nodes: Scattered low-attenuation mediastinal and hilar adenopathy with several borderline enlarged lymph nodes including the 12 mm precarinal lymph node (4/40) and a 16 mm subcarinal lymph node (4/50). No mediastinal fluid or gas. No acute abnormality of the trachea or esophagus. Posterior bowing of the trachea likely related to imaging during exhalation for the angiographic technique. Included portions of thyroid gland are unremarkable. Lungs/Pleura: Widespread multifocal areas of mixed ground-glass and consolidative opacity on a background interlobular septal thickening and cephalized vascularity with small bilateral pleural effusions, right greater than left. Atelectatic changes are present in the lungs with some mosaic attenuation which could reflect imaging during exhalation or small airways disease/air trapping. Upper Abdomen: No acute abnormalities present in the visualized portions of the upper abdomen. Musculoskeletal: Multilevel degenerative changes are present in the imaged portions of the spine. No acute osseous abnormality or suspicious osseous lesion. Prior cervical spinal fusion is partially included in the level of imaging without acute hardware complication. Review of the MIP images confirms the above findings. IMPRESSION: 1. No evidence of acute pulmonary embolism. 2. Widespread multifocal areas of mixed ground-glass and consolidative opacity on a background of interlobular septal thickening and cephalized vascularity with small bilateral pleural effusions, right greater than left. Findings are favored to reflect CHF with pulmonary edema though superimposed infection is not fully excluded. 3. Scattered borderline enlarged mediastinal lymph nodes are likely reactive in etiology. 4. Three-vessel  coronary artery calcifications. Left ventricular calcification likely reflects sequela of prior infarct. 5. Lipomatous hypertrophy of the intra-atrial septum. 6. Aortic Atherosclerosis (ICD10-I70.0). Electronically Signed   By: Kreg Shropshire M.D.   On: 11/09/2019 02:14   MR BRAIN WO CONTRAST  Result Date: 11/04/2019 CLINICAL DATA:  TIA.  Slurred speech and blurry vision. EXAM: MRI HEAD WITHOUT CONTRAST TECHNIQUE: Multiplanar, multiecho pulse sequences of the brain and surrounding structures were obtained without intravenous contrast. COMPARISON:  Head CT 05/26/2019 FINDINGS: Brain: No acute infarction, hemorrhage, hydrocephalus, extra-axial collection or mass lesion. 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 Wedge following the left basal ganglia is difficult to distinguish between a normal vessel and remote perforator infarct. Brain volume is normal. No acute infarct, hydrocephalus, collection, or masslike finding. Vascular: Normal flow voids Skull and upper cervical spine: Metal artifact near the left zygomatic arch. No evidence of marrow lesion. Sinuses/Orbits: Negative IMPRESSION: 1. Motion degraded brain MRI without acute finding. 2. 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 Electronically Signed   By: Marnee Spring M.D.   On: 11/04/2019 17:00   CT ABDOMEN  PELVIS W CONTRAST  Result Date: 10/31/2019 CLINICAL DATA:  Nausea and vomiting and abdominal cramping for 3 days. EXAM: CT ABDOMEN AND PELVIS WITH CONTRAST TECHNIQUE: Multidetector CT imaging of the abdomen and pelvis was performed using the standard protocol following bolus administration of intravenous contrast. CONTRAST:  OMNIPAQUE IOHEXOL 300 MG/ML  SOLN COMPARISON:  CT scan dated 11/27/2015 FINDINGS: Lower chest: Extensive chronic stable calcification in the heart. Hepatobiliary: No focal liver abnormality is seen. No gallstones, gallbladder wall thickening, or biliary  dilatation. Pancreas: Unremarkable. No pancreatic ductal dilatation or surrounding inflammatory changes. Spleen: Normal in size without focal abnormality. Adrenals/Urinary Tract: Tiny stone in the mid right kidney. The kidneys are otherwise normal. No hydronephrosis. The visualized portion of the bladder is normal. Left hip prosthesis obscures some detail of the bladder. Stomach/Bowel: Stomach is within normal limits. Appendix appears normal. No evidence of bowel wall thickening, distention, or inflammatory changes. Vascular/Lymphatic: Aortic atherosclerosis. No enlarged abdominal or pelvic lymph nodes. Reproductive: Status post hysterectomy. No significant adnexal masses. Small cysts on each ovary. Other: 3 cm periumbilical hernia containing only fat, unchanged. No ascites. No free air. Musculoskeletal: No acute abnormality. Left total hip prosthesis. Lower lumbar fusion. IMPRESSION: 1. No acute abnormality of the abdomen or pelvis. 2. Tiny stone in the mid right kidney. Electronically Signed   By: Francene Boyers M.D.   On: 10/31/2019 15:23   CARDIAC CATHETERIZATION  Result Date: 11/10/2019 Flouroscopy reveals extensive mitral annular calcification. Normal epicardial coronary arteries. LV EDP 23 mmHg RECOMMENDATION: Medical therapy for multiple risk factors.  DG Chest Portable 1 View  Result Date: 10/31/2019 CLINICAL DATA:  Nausea, vomiting, and abdominal cramping for the past 3 days. EXAM: PORTABLE CHEST 1 VIEW COMPARISON:  CT chest dated August 29, 2019. Chest x-ray dated April 26, 2019. FINDINGS: The heart size and mediastinal contours are within normal limits. Normal pulmonary vascularity. No focal consolidation, pleural effusion, or pneumothorax. Unchanged elevation of the right hemidiaphragm. No acute osseous abnormality. IMPRESSION: No active disease. Electronically Signed   By: Obie Dredge M.D.   On: 10/31/2019 14:34   ECHOCARDIOGRAM COMPLETE  Result Date: 11/05/2019   ECHOCARDIOGRAM REPORT    Patient Name:   CHAVY AVERA Hartinger Date of Exam: 11/05/2019 Medical Rec #:  [id number]     Height:       64.0 in Accession #:    [id number]    Weight:       358.5 lb Date of Birth:  1951-04-07      BSA:          2.51 m Patient Age:    68 years      BP:           119/46 mmHg Patient Gender: F             HR:           69 bpm. Exam Location:  Inpatient Procedure: 2D Echo, Color Doppler, Cardiac Doppler and Intracardiac            Opacification Agent Indications:    Syncope 780.2 / R55  History:        Patient has no prior history of Echocardiogram examinations.                 CAD, Signs/Symptoms:Dyspnea; Risk Factors:Hypertension,                 Diabetes, Dyslipidemia, Former Smoker and Obesity. Thoracic  aortic atherosclerosis.  Sonographer:    Leeroy Bock Turrentine Referring Phys: [id number] PARDEEP [id number]  Sonographer Comments: Image acquisition challenging due to patient body habitus. IMPRESSIONS  1. Left ventricular ejection fraction, by visual estimation, is 60 to 65%. The left ventricle has normal function. There is borderline left ventricular hypertrophy.  2. Definity contrast agent was given IV to delineate the left ventricular endocardial borders.  3. Elevated left ventricular end-diastolic pressure.  4. Left ventricular diastolic parameters are consistent with Grade I diastolic dysfunction (impaired relaxation).  5. The left ventricle has no regional wall motion abnormalities.  6. Global right ventricle has normal systolic function.The right ventricular size is mildly enlarged. No increase in right ventricular wall thickness.  7. Left atrial size was normal.  8. Right atrial size was normal.  9. Severe mitral annular calcification. 10. The mitral valve is degenerative. Trivial mitral valve regurgitation. 11. The tricuspid valve is grossly normal. 12. The tricuspid valve is grossly normal. Tricuspid valve regurgitation is trivial. 13. The aortic valve is tricuspid. Aortic valve regurgitation is not  visualized. No evidence of aortic valve sclerosis or stenosis. 14. The pulmonic valve was grossly normal. Pulmonic valve regurgitation is trivial. 15. The inferior vena cava is normal in size with greater than 50% respiratory variability, suggesting right atrial pressure of 3 mmHg. FINDINGS  Left Ventricle: Left ventricular ejection fraction, by visual estimation, is 60 to 65%. The left ventricle has normal function. Definity contrast agent was given IV to delineate the left ventricular endocardial borders. The left ventricle has no regional wall motion abnormalities. The left ventricular internal cavity size was the left ventricle is normal in size. There is borderline left ventricular hypertrophy. Left ventricular diastolic parameters are consistent with Grade I diastolic dysfunction (impaired relaxation). Elevated left ventricular end-diastolic pressure. Right Ventricle: The right ventricular size is mildly enlarged. No increase in right ventricular wall thickness. Global RV systolic function is has normal systolic function. Left Atrium: Left atrial size was normal in size. Right Atrium: Right atrial size was normal in size Pericardium: There is no evidence of pericardial effusion. Mitral Valve: The mitral valve is degenerative in appearance. Severe mitral annular calcification. Trivial mitral valve regurgitation. Tricuspid Valve: The tricuspid valve is grossly normal. Tricuspid valve regurgitation is trivial. Aortic Valve: The aortic valve is tricuspid. Aortic valve regurgitation is not visualized. The aortic valve is structurally normal, with no evidence of sclerosis or stenosis. Mild aortic valve annular calcification. Aortic valve mean gradient measures 8.0 mmHg. Aortic valve peak gradient measures 15.7 mmHg. Aortic valve area, by VTI measures 2.24 cm. Pulmonic Valve: The pulmonic valve was grossly normal. Pulmonic valve regurgitation is trivial. Pulmonic regurgitation is trivial. Aorta: The aortic root is  normal in size and structure. Venous: The inferior vena cava is normal in size with greater than 50% respiratory variability, suggesting right atrial pressure of 3 mmHg. IAS/Shunts: No atrial level shunt detected by color flow Doppler.  LEFT VENTRICLE PLAX 2D LVIDd:         5.50 cm  Diastology LVIDs:         3.40 cm  LV e' medial:   7.40 cm/s LV PW:         1.00 cm  LV E/e' medial: 19.1 LV IVS:        1.00 cm LVOT diam:     2.00 cm LV SV:         100 ml LV SV Index:   35.43 LVOT Area:     3.14 cm  RIGHT VENTRICLE RV S prime:     15.30 cm/s LEFT ATRIUM             Index       RIGHT ATRIUM           Index LA diam:        5.20 cm 2.07 cm/m  RA Area:     17.10 cm LA Vol (A2C):   66.6 ml 26.57 ml/m RA Volume:   43.90 ml  17.51 ml/m LA Vol (A4C):   82.1 ml 32.75 ml/m LA Biplane Vol: 75.3 ml 30.04 ml/m  AORTIC VALVE AV Area (Vmax):    2.62 cm AV Area (Vmean):   2.58 cm AV Area (VTI):     2.24 cm AV Vmax:           198.00 cm/s AV Vmean:          123.000 cm/s AV VTI:            0.349 m AV Peak Grad:      15.7 mmHg AV Mean Grad:      8.0 mmHg LVOT Vmax:         165.00 cm/s LVOT Vmean:        101.000 cm/s LVOT VTI:          0.249 m LVOT/AV VTI ratio: 0.71  AORTA Ao Root diam: 2.90 cm MITRAL VALVE MV Area (PHT): 2.74 cm              SHUNTS MV PHT:        80.33 msec            Systemic VTI:  0.25 m MV Decel Time: 277 msec              Systemic Diam: 2.00 cm MV E velocity: 141.00 cm/s 103 cm/s MV A velocity: 134.00 cm/s 70.3 cm/s MV E/A ratio:  1.05        1.5  Prentice Docker MD Electronically signed by Prentice Docker MD Signature Date/Time: 11/05/2019/12:41:25 PM    Final    NM GASTRIC EMPTYING SOLID LIQUID BOTH W/SB TRANSIT  Result Date: 11/14/2019 CLINICAL DATA:  Abdominal pain and vomiting EXAM: NUCLEAR MEDICINE GASTRIC EMPTYING SCAN TECHNIQUE: After oral ingestion of radiolabeled meal, sequential abdominal images were obtained for 4 hours. Percentage of activity emptying the stomach was calculated at 1  hour, 2 hour, 3 hour, and 4 hours. RADIOPHARMACEUTICALS:  2.0 mCi Tc-16m sulfur colloid in standardized meal COMPARISON:  11/08/2019 FINDINGS: Expected location of the stomach in the left upper quadrant. Ingested meal empties the stomach gradually over the course of the study. 22.7% emptied at 1 hr ( normal >= 10%) 50.5% emptied at 2 hr ( normal >= 40%) 73.3% emptied at 3 hr ( normal >= 70%) 81.5% emptied at 4 hr ( normal >= 90%) IMPRESSION: Normal gastric emptying study. The mildly decreased emptying at 4 hours is nonspecific, and based on earlier measurements normal gastric emptying is noted. Electronically Signed   By: Sharlet Salina M.D.   On: 11/14/2019 15:13   VAS [place] LOWER EXTREMITY VENOUS (DVT)  Result Date: 11/10/2019  Lower Venous DVT Study Indications: Elevated Ddimer.  Risk Factors: None identified. Limitations: Body habitus, poor ultrasound/tissue interface and patient positioning, patient pain tolerance. Comparison Study: No prior studies. Performing Technologist: Chanda Busing RVT  Examination Guidelines: A complete evaluation includes B-mode imaging, spectral Doppler, color Doppler, and power Doppler as needed of all accessible portions of each vessel. Bilateral testing is considered  an integral part of a complete examination. Limited examinations for reoccurring indications may be performed as noted. The reflux portion of the exam is performed with the patient in reverse Trendelenburg.  +---------+---------------+---------+-----------+----------+--------------+ RIGHT    CompressibilityPhasicitySpontaneityPropertiesThrombus Aging +---------+---------------+---------+-----------+----------+--------------+ CFV      Full           Yes      Yes                                 +---------+---------------+---------+-----------+----------+--------------+ SFJ      Full                                                         +---------+---------------+---------+-----------+----------+--------------+ FV Prox  Full                                                        +---------+---------------+---------+-----------+----------+--------------+ FV Mid   Full                                                        +---------+---------------+---------+-----------+----------+--------------+ FV Distal                                             Not visualized +---------+---------------+---------+-----------+----------+--------------+ POP                                                   Not visualized +---------+---------------+---------+-----------+----------+--------------+ PTV                                                   Not visualized +---------+---------------+---------+-----------+----------+--------------+ PERO                                                  Not visualized +---------+---------------+---------+-----------+----------+--------------+   +---------+---------------+---------+-----------+----------+--------------+ LEFT     CompressibilityPhasicitySpontaneityPropertiesThrombus Aging +---------+---------------+---------+-----------+----------+--------------+ CFV      Full           Yes      Yes                                 +---------+---------------+---------+-----------+----------+--------------+ SFJ      Full                                                        +---------+---------------+---------+-----------+----------+--------------+  FV Prox  Full                                                        +---------+---------------+---------+-----------+----------+--------------+ FV Mid                  Yes      Yes                                 +---------+---------------+---------+-----------+----------+--------------+ FV Distal                                             Not visualized  +---------+---------------+---------+-----------+----------+--------------+ POP      Full           Yes      Yes                                 +---------+---------------+---------+-----------+----------+--------------+ PTV                                                   Not visualized +---------+---------------+---------+-----------+----------+--------------+ PERO                                                  Not visualized +---------+---------------+---------+-----------+----------+--------------+     Summary: RIGHT: - There is no evidence of deep vein thrombosis in the lower extremity. However, portions of this examination were limited- see technologist comments above.  - No cystic structure found in the popliteal fossa.  LEFT: - There is no evidence of deep vein thrombosis in the lower extremity. However, portions of this examination were limited- see technologist comments above.  - No cystic structure found in the popliteal fossa.  *See table(s) above for measurements and observations. Electronically signed by Coral Else MD on 11/10/2019 at 7:29:36 AM.    Final    ECHOCARDIOGRAM LIMITED  Result Date: 11/09/2019    ECHOCARDIOGRAM LIMITED REPORT   Patient Name:   KIANNA LEVAY Reister Date of Exam: 11/09/2019 Medical Rec #:  [id number]     Height:       64.0 in Accession #:    [id number]    Weight:       367.8 lb Date of Birth:  15-Jun-1951      BSA:          2.53 m Patient Age:    68 years      BP:           142/84 mmHg Patient Gender: F             HR:           87 bpm. Exam Location:  Inpatient Procedure: Limited Echo and Intracardiac Opacification Agent Indications:    CHF  History:        Patient has prior history  of Echocardiogram examinations, most                 recent 11/05/2019. CAD and Acute MI; Risk Factors:Hypertension,                 Diabetes and Dyslipidemia. Elevated Troponin, Morbid obesity.  Sonographer:    Lavenia Atlas Referring Phys: [id number] VASUNDHRA RATHORE   Sonographer Comments: Patient is morbidly obese. Image acquisition challenging due to patient body habitus. IMPRESSIONS  1. There is akinesis of the apical septal, mid and apical lateral and inferolateral and apical walls. . Left ventricular ejection fraction, by estimation, is 35 to 40%. The left ventricle has moderately decreased function. The left ventrical demonstrates regional wall motion abnormalities. There is mildly increased concentric left ventricular hypertrophy. Left ventricular diastolic parameters are consistent with Grade I diastolic dysfunction (impaired relaxation). Elevated left ventricular end-diastolic pressure.  2. Right ventricular systolic function is normal. The right ventricular size is normal. Tricuspid regurgitation signal is inadequate for assessing PA pressure.  3. Severe mitral annular calcification of the posterior mitral valve annulus. No evidence of mitral valve regurgitation. No evidence of mitral stenosis.  4. The aortic valve is normal in structure and function. Aortic valve regurgitation is not visualized. No aortic stenosis is present.  5. The inferior vena cava is normal in size with <50% respiratory variability, suggesting right atrial pressure of 8 mmHg. FINDINGS  Left Ventricle: There is akinesis of the apical septal, mid and apical lateral and inferolateral and apical walls. Left ventricular ejection fraction, by estimation, is 35 to 40%. The left ventricle has moderately decreased function. The left ventricle demonstrates regional wall motion abnormalities. The left ventricular internal cavity size was normal in size. There is mildly increased concentric left ventricular hypertrophy. Elevated left ventricular end-diastolic pressure. Right Ventricle: The right ventricular size is normal. No increase in right ventricular wall thickness. Right ventricular systolic function is normal. Tricuspid regurgitation signal is inadequate for assessing PA pressure. Left Atrium: Left  atrial size was normal in size. Right Atrium: Right atrial size was normal in size. Pericardium: There is no evidence of pericardial effusion. Mitral Valve: The mitral valve is normal in structure and function. Normal mobility of the mitral valve leaflets. Severe mitral annular calcification of the posterior mitral valve annulus. No evidence of mitral valve stenosis. Tricuspid Valve: The tricuspid valve is normal in structure. Tricuspid valve regurgitation is not demonstrated. No evidence of tricuspid stenosis. Aortic Valve: The aortic valve is normal in structure and function. Aortic valve regurgitation is not visualized. No aortic stenosis is present. Pulmonic Valve: The pulmonic valve was normal in structure. Pulmonic valve regurgitation is not visualized. No evidence of pulmonic stenosis. Aorta: The aortic root is normal in size and structure. Venous: The inferior vena cava is normal in size with less than 50% respiratory variability, suggesting right atrial pressure of 8 mmHg. The inferior vena cava and the hepatic vein show a normal flow pattern. IAS/Shunts: No atrial level shunt detected by color flow Doppler.  LEFT VENTRICLE PLAX 2D LVIDd:         3.81 cm Diastology LVIDs:         2.57 cm LV e' lateral:   7.07 cm/s LV PW:         1.10 cm LV E/e' lateral: 12.8 LV IVS:        1.26 cm LV e' medial:    5.11 cm/s LV SV Index:   13.43   LV E/e' medial:  17.7  LEFT  ATRIUM         Index LA diam:    4.20 cm 1.66 cm/m   AORTA Ao Root diam: 3.20 cm MITRAL VALVE MV Area (PHT): 6.71 cm MV Decel Time: 113 msec MV E velocity: 90.20 cm/s  103 cm/s MV A velocity: 108.00 cm/s 70.3 cm/s MV E/A ratio:  0.84        1.5 Armanda Magic MD Electronically signed by Armanda Magic MD Signature Date/Time: 11/09/2019/1:47:28 PM    Final      Discharge Exam: Vitals:   11/14/19 0804 11/14/19 1517  BP:  (!) 156/58  Pulse: 71 71  Resp: 16 18  Temp:  98.2 F (36.8 C)  SpO2: 100% 99%   Vitals:   11/13/19 2104 11/14/19 0448  11/14/19 0804 11/14/19 1517  BP: (!) 135/59   (!) 156/58  Pulse: 67  71 71  Resp: Temp:  97.9 F (36.6 C)  98.2 F (36.8 C)  TempSrc:  Oral  Oral  SpO2: 99%  100% 99%  Weight:  (!) 168.9 kg    Height:        General: Pt is alert, awake, not in acute distress Cardiovascular: RRR, S1/S2 +, no rubs, no gallops Respiratory: CTA bilaterally, no wheezing, no rhonchi Abdominal: Soft, NT, ND, bowel sounds + Extremities: Trace pitting edema bilateral lower extremity, no cyanosis    The results of significant diagnostics from this hospitalization (including imaging, microbiology, ancillary and laboratory) are listed below for reference.     Microbiology: Recent Results (from the past 240 hour(s))  Respiratory Panel by RT PCR (Flu A&B, Covid) - Nasopharyngeal Swab     Status: None   Collection Time: 11/04/19  6:29 PM   Specimen: Nasopharyngeal Swab  Result Value Ref Range Status   SARS Coronavirus 2 by RT PCR NEGATIVE NEGATIVE Final    Comment: (NOTE) SARS-CoV-2 target nucleic acids are NOT DETECTED. The SARS-CoV-2 RNA is generally detectable in upper respiratoy specimens during the acute phase of infection. The lowest concentration of SARS-CoV-2 viral copies this assay can detect is 131 copies/mL. A negative result does not preclude SARS-Cov-2 infection and should not be used as the sole basis for treatment or other patient management decisions. A negative result may occur with  improper specimen collection/handling, submission of specimen other than nasopharyngeal swab, presence of viral mutation(s) within the areas targeted by this assay, and inadequate number of viral copies (<131 copies/mL). A negative result must be combined with clinical observations, patient history, and epidemiological information. The expected result is Negative. Fact Sheet for Patients:  https://www.moore.com/ Fact Sheet for Healthcare Providers:   https://www.young.biz/ This test is not yet ap proved or cleared by the [place] FDA and  has been authorized for detection and/or diagnosis of SARS-CoV-2 by FDA under an Emergency Use Authorization (EUA). This EUA will remain  in effect (meaning this test can be used) for the duration of the COVID-19 declaration under Section 564(b)(1) of the Act, 21 U.S.C. section 360bbb-3(b)(1), unless the authorization is terminated or revoked sooner.    Influenza A by PCR NEGATIVE NEGATIVE Final   Influenza B by PCR NEGATIVE NEGATIVE Final    Comment: (NOTE) The Xpert Xpress SARS-CoV-2/FLU/RSV assay is intended as an aid in  the diagnosis of influenza from Nasopharyngeal swab specimens and  should not be used as a sole basis for treatment. Nasal washings and  aspirates are unacceptable for Xpert Xpress SARS-CoV-2/FLU/RSV  testing. Fact Sheet for Patients: https://www.moore.com/ Fact  Sheet for Healthcare Providers: https://www.young.biz/ This test is not yet approved or cleared by the [place] and  has been authorized for detection and/or diagnosis of SARS-CoV-2 by  FDA under an Emergency Use Authorization (EUA). This EUA will remain  in effect (meaning this test can be used) for the duration of the  Covid-19 declaration under Section 564(b)(1) of the Act, 21  U.S.C. section 360bbb-3(b)(1), unless the authorization is  terminated or revoked. Performed at [facility], [address] [place], [place] [address]   Urine culture     Status: Abnormal   Collection Time: 11/05/19 12:04 AM   Specimen: Urine, Random  Result Value Ref Range Status   Specimen Description   Final    URINE, RANDOM Performed at [facility], [address], [place], [place] [address]    Special Requests   Final    NONE Performed at [facility], [address] [place], [place] [address]     Culture MULTIPLE SPECIES PRESENT, SUGGEST RECOLLECTION (A)  Final   Report Status 11/06/2019 FINAL  Final  SARS CORONAVIRUS 2 (TAT 6-24 HRS) Nasopharyngeal Nasopharyngeal Swab     Status: None   Collection Time: 11/09/19  1:49 AM   Specimen: Nasopharyngeal Swab  Result Value Ref Range Status   SARS Coronavirus 2 NEGATIVE NEGATIVE Final    Comment: (NOTE) SARS-CoV-2 target nucleic acids are NOT DETECTED. The SARS-CoV-2 RNA is generally detectable in upper and lower respiratory specimens during the acute phase of infection. Negative results do not preclude SARS-CoV-2 infection, do not rule out co-infections with other pathogens, and should not be used as the sole basis for treatment or other patient management decisions. Negative results must be combined with clinical observations, patient history, and epidemiological information. The expected result is Negative. Fact Sheet for Patients: HairSlick.no Fact Sheet for Healthcare Providers: quierodirigir.com This test is not yet approved or cleared by the [place] FDA and  has been authorized for detection and/or diagnosis of SARS-CoV-2 by FDA under an Emergency Use Authorization (EUA). This EUA will remain  in effect (meaning this test can be used) for the duration of the COVID-19 declaration under Section 56 4(b)(1) of the Act, 21 U.S.C. section 360bbb-3(b)(1), unless the authorization is terminated or revoked sooner. Performed at [facility], [address], [place], [place] [address]      Labs: BNP (last 3 results) Recent Labs    11/09/19 0504 11/11/19 0321  BNP 565.6* 143.6*   Basic Metabolic Panel: Recent Labs  Lab 11/08/19 2057 11/09/19 0504 11/10/19 0353 11/11/19 0321 11/12/19 0426 11/13/19 0802 11/14/19 0347  NA   < >  --  136 138 137 135 136  K   < >  --  3.9 3.9 4.1 3.9 4.0  CL   < >  --  98 97* 100 100 96*  CO2   < >  --  30 31 29 28 30    GLUCOSE   < >  --  159* 164* 136* 120* 130*  BUN   < >  --  11 12 9 8 10   CREATININE   < >  --  0.63 0.68 0.62 0.57 0.71  CALCIUM   < >  --  8.7* 8.9 8.7* 8.6* 8.8*  MG  --  1.7  --  1.9 1.8 2.0 2.0  PHOS  --   --   --   --  3.4 3.1 4.2   < > =  values in this interval not displayed.   Liver Function Tests: Recent Labs  Lab 11/08/19 2057 11/12/19 0426 11/13/19 0802 11/14/19 0347  AST 28  --   --   --   ALT 32  --   --   --   ALKPHOS 83  --   --   --   BILITOT 0.6  --   --   --   PROT 7.4  --   --   --   ALBUMIN 4.2 2.9* 3.0* 2.9*   Recent Labs  Lab 11/08/19 2057  LIPASE 22   No results for input(s): AMMONIA in the last 168 hours. CBC: Recent Labs  Lab 11/08/19 2057 11/08/19 2057 11/10/19 0353 11/11/19 0321 11/12/19 0426 11/13/19 0802 11/14/19 0347  WBC 8.5   < > 7.7 6.3 6.9 6.4 6.8  NEUTROABS 7.3  --   --   --   --   --   --   HGB 14.1   < > 10.8* 10.6* 10.2* 10.1* 10.4*  HCT 43.0   < > 33.7* 34.0* 31.8* 31.7* 32.4*  MCV 86.5   < > 88.9 90.7 88.1 87.6 88.5  PLT 288   < > 223 216 196 212 211   < > = values in this interval not displayed.   Cardiac Enzymes: No results for input(s): CKTOTAL, CKMB, CKMBINDEX, TROPONINI in the last 168 hours. BNP: Invalid input(s): POCBNP CBG: Recent Labs  Lab 11/13/19 1928 11/13/19 2358 11/14/19 0449 11/14/19 0758 11/14/19 1508  GLUCAP 136* 132* 128* 110* 97   D-Dimer No results for input(s): DDIMER in the last 72 hours. Hgb A1c No results for input(s): HGBA1C in the last 72 hours. Lipid Profile No results for input(s): CHOL, HDL, LDLCALC, TRIG, CHOLHDL, LDLDIRECT in the last 72 hours. Thyroid function studies No results for input(s): TSH, T4TOTAL, T3FREE, THYROIDAB in the last 72 hours.  Invalid input(s): FREET3 Anemia work up No results for input(s): VITAMINB12, FOLATE, FERRITIN, TIBC, IRON, RETICCTPCT in the last 72 hours. Urinalysis    Component Value Date/Time   COLORURINE YELLOW 11/08/2019 0300    APPEARANCEUR CLEAR 11/08/2019 0300   APPEARANCEUR Clear 03/04/2019 0948   LABSPEC >1.046 (H) 11/08/2019 0300   LABSPEC 1.017 09/25/2012 2123   PHURINE 6.0 11/08/2019 0300   GLUCOSEU 150 (A) 11/08/2019 0300   GLUCOSEU Negative 09/25/2012 2123   HGBUR NEGATIVE 11/08/2019 0300   HGBUR negative 05/17/2010 0946   BILIRUBINUR NEGATIVE 11/08/2019 0300   BILIRUBINUR Negative 03/04/2019 0948   BILIRUBINUR Negative 09/25/2012 2123   KETONESUR 20 (A) 11/08/2019 0300   PROTEINUR 100 (A) 11/08/2019 0300   UROBILINOGEN 0.2 01/14/2019 0934   UROBILINOGEN 1.0 08/30/2012 1511   NITRITE NEGATIVE 11/08/2019 0300   LEUKOCYTESUR NEGATIVE 11/08/2019 0300   LEUKOCYTESUR Negative 09/25/2012 2123   Sepsis Labs Invalid input(s): PROCALCITONIN,  WBC,  LACTICIDVEN Microbiology Recent Results (from the past 240 hour(s))  Respiratory Panel by RT PCR (Flu A&B, Covid) - Nasopharyngeal Swab     Status: None   Collection Time: 11/04/19  6:29 PM   Specimen: Nasopharyngeal Swab  Result Value Ref Range Status   SARS Coronavirus 2 by RT PCR NEGATIVE NEGATIVE Final    Comment: (NOTE) SARS-CoV-2 target nucleic acids are NOT DETECTED. The SARS-CoV-2 RNA is generally detectable in upper respiratoy specimens during the acute phase of infection. The lowest concentration of SARS-CoV-2 viral copies this assay can detect is 131 copies/mL. A negative result does not preclude SARS-Cov-2 infection and should not be used  as the sole basis for treatment or other patient management decisions. A negative result may occur with  improper specimen collection/handling, submission of specimen other than nasopharyngeal swab, presence of viral mutation(s) within the areas targeted by this assay, and inadequate number of viral copies (<131 copies/mL). A negative result must be combined with clinical observations, patient history, and epidemiological information. The expected result is Negative. Fact Sheet for Patients:   https://www.moore.com/ Fact Sheet for Healthcare Providers:  https://www.young.biz/ This test is not yet ap proved or cleared by the [place] FDA and  has been authorized for detection and/or diagnosis of SARS-CoV-2 by FDA under an Emergency Use Authorization (EUA). This EUA will remain  in effect (meaning this test can be used) for the duration of the COVID-19 declaration under Section 564(b)(1) of the Act, 21 U.S.C. section 360bbb-3(b)(1), unless the authorization is terminated or revoked sooner.    Influenza A by PCR NEGATIVE NEGATIVE Final   Influenza B by PCR NEGATIVE NEGATIVE Final    Comment: (NOTE) The Xpert Xpress SARS-CoV-2/FLU/RSV assay is intended as an aid in  the diagnosis of influenza from Nasopharyngeal swab specimens and  should not be used as a sole basis for treatment. Nasal washings and  aspirates are unacceptable for Xpert Xpress SARS-CoV-2/FLU/RSV  testing. Fact Sheet for Patients: https://www.moore.com/ Fact Sheet for Healthcare Providers: https://www.young.biz/ This test is not yet approved or cleared by the [place] FDA and  has been authorized for detection and/or diagnosis of SARS-CoV-2 by  FDA under an Emergency Use Authorization (EUA). This EUA will remain  in effect (meaning this test can be used) for the duration of the  Covid-19 declaration under Section 564(b)(1) of the Act, 21  U.S.C. section 360bbb-3(b)(1), unless the authorization is  terminated or revoked. Performed at [facility], [address] [place], [place] [address]   Urine culture     Status: Abnormal   Collection Time: 11/05/19 12:04 AM   Specimen: Urine, Random  Result Value Ref Range Status   Specimen Description   Final    URINE, RANDOM Performed at [facility], [address], [place], [place] [address]    Special Requests   Final    NONE Performed  at [facility], [address], [place], [place] [address]    Culture MULTIPLE SPECIES PRESENT, SUGGEST RECOLLECTION (A)  Final   Report Status 11/06/2019 FINAL  Final  SARS CORONAVIRUS 2 (TAT 6-24 HRS) Nasopharyngeal Nasopharyngeal Swab     Status: None   Collection Time: 11/09/19  1:49 AM   Specimen: Nasopharyngeal Swab  Result Value Ref Range Status   SARS Coronavirus 2 NEGATIVE NEGATIVE Final    Comment: (NOTE) SARS-CoV-2 target nucleic acids are NOT DETECTED. The SARS-CoV-2 RNA is generally detectable in upper and lower respiratory specimens during the acute phase of infection. Negative results do not preclude SARS-CoV-2 infection, do not rule out co-infections with other pathogens, and should not be used as the sole basis for treatment or other patient management decisions. Negative results must be combined with clinical observations, patient history, and epidemiological information. The expected result is Negative. Fact Sheet for Patients: HairSlick.no Fact Sheet for Healthcare Providers: quierodirigir.com This test is not yet approved or cleared by the [place] FDA and  has been authorized for detection and/or diagnosis of SARS-CoV-2 by FDA under an Emergency Use Authorization (EUA). This EUA will remain  in effect (meaning this test can be used) for the duration of the COVID-19  declaration under Section 56 4(b)(1) of the Act, 21 U.S.C. section 360bbb-3(b)(1), unless the authorization is terminated or revoked sooner. Performed at [facility], [place] [address], [place], [place] [address]      Time coordinating discharge: Over 30 minutes  SIGNED:   Darliss Cheney, MD  [facility] 11/14/2019, 4:48 PM  If 7PM-7AM, please contact night-coverage [email or web address]

## 2019-11-14 NOTE — TOC Initial Note (Signed)
Transition of Care [facility]) - Initial/Assessment Note    Patient Details  Name: Amy Barnes MRN: [id number] Date of Birth: 06/20/1951  Transition of Care [facility]) CM/SW Contact:    Gala Lewandowsky, RN Phone Number: 11/14/2019, 4:46 PM  Clinical Narrative:    Patient presented for Nstemi- Prior to arrival from home with spouse, daughter and grandchildren. Patient's daughter takes to appointments. Patient has primary care physician and she uses [facility] [address] [place]. Patient will be new to [facility], Benefits check submitted for cost. Staff RN will make the patient aware of cost. 30 day free Entresto card provided to patient and daughter. No home health needs identified at this time. No further needs from Case Manager.                Expected Discharge Plan: Home/Self Care Barriers to Discharge: No Barriers Identified   Patient Goals and CMS Choice Patient states their goals for this hospitalization and ongoing recovery are:: "to go home"   Choice offered to / list presented to : NA  Expected Discharge Plan and Services Expected Discharge Plan: Home/Self Care In-house Referral: NA Discharge Planning Services: CM Consult, Medication Assistance(New Entresto) Post Acute Care Choice: NA Living arrangements for the past 2 months: Single Family Home Expected Discharge Date: 11/14/19                  [facility] Arranged: NA     Prior Living Arrangements/Services Living arrangements for the past 2 months: Single Family Home Lives with:: Spouse, Adult Children, Minor Children Patient language and need for interpreter reviewed:: Yes Do you feel safe going back to the place where you live?: Yes      Need for Family Participation in Patient Care: Yes (Comment) Care giver support system in place?: Yes (comment)   Criminal Activity/Legal Involvement Pertinent to Current Situation/Hospitalization: No - Comment as needed  Activities of Daily Living Home Assistive  Devices/Equipment: Bedside commode/3-in-1, Cane (specify quad or straight), Art gallery manager, Shower chair with back, Sports administrator, Environmental consultant (specify type), Wheelchair, Other (Comment)(ramp entrance, manual wheelchair, walk-in shower, single point cane-2, front wheeled walker) ADL Screening (condition at time of admission) Patient's cognitive ability adequate to safely complete daily activities?: Yes Is the patient deaf or have difficulty hearing?: No Does the patient have difficulty seeing, even when wearing glasses/contacts?: No Does the patient have difficulty concentrating, remembering, or making decisions?: No Patient able to express need for assistance with ADLs?: Yes Does the patient have difficulty dressing or bathing?: No Independently performs ADLs?: No Communication: Independent Dressing (OT): Independent Grooming: Independent Feeding: Independent Bathing: Independent Toileting: Independent with device (comment) In/Out Bed: Independent Walks in Home: Independent with device (comment) Does the patient have difficulty walking or climbing stairs?: Yes Weakness of Legs: Both Weakness of Arms/Hands: None  Permission Sought/Granted Permission sought to share information with : Family Supports    Emotional Assessment Appearance:: Appears stated age Attitude/Demeanor/Rapport: Unable to Assess Affect (typically observed): Unable to Assess Orientation: : Oriented to Self, Oriented to Place Alcohol / Substance Use: Not Applicable Psych Involvement: No (comment)  Admission diagnosis:  Generalized abdominal pain [R10.84] NSTEMI (non-ST elevated myocardial infarction) (HCC) [I21.4] Nausea and vomiting, intractability of vomiting not specified, unspecified vomiting type [R11.2] Patient Active Problem List   Diagnosis Date Noted  . Elevated troponin   . NSTEMI (non-ST elevated myocardial infarction) (HCC) 11/09/2019  . Acute respiratory failure with hypoxia (HCC) 11/09/2019  . Acute  pulmonary edema (HCC) 11/09/2019  . Abdominal  pain with vomiting 11/09/2019  . QT prolongation 11/09/2019  . Acute kidney injury (HCC) 11/04/2019  . Hyponatremia 11/03/2019  . Epigastric abdominal pain 11/03/2019  . Non-intractable vomiting 11/03/2019  . Diminished pulses in lower extremity 08/11/2019  . Osteopenia 08/11/2019  . Wound infection complicating hardware (HCC) 04/01/2019  . 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 Bilateral lower extremity edema 11/29/2018  . Neurogenic urinary incontinence 11/29/2018  . Urinary incontinence 11/29/2018  . Bladder spasms 11/29/2018  . Discogenic low back pain 11/29/2018  . Chronic sacroiliac joint pain (Bilateral) (R>L) 11/29/2018  . Spondylosis without myelopathy or radiculopathy, lumbar region 11/09/2018  . Chronic low back pain (Bilateral) (L>R) w/o sciatica 11/09/2018  . History of hip replacement (Left) 10/13/2018  . History of back surgery 10/13/2018  . Vitamin D insufficiency 10/13/2018  . Osteoarthritis of facet joint of lumbar spine 10/13/2018  . Osteoarthritis involving multiple joints 10/13/2018  . Cervical radiculopathy (Left) 10/13/2018  . Lumbar facet arthropathy (Bilateral) 10/13/2018  . Lumbar facet syndrome (Bilateral) (L>R) 10/13/2018  . Abnormal MRI, lumbar spine (09/16/2018) 10/13/2018  . Lumbar nerve root compression (L4) (Left) 10/13/2018  . Lumbar foraminal stenosis 10/13/2018  . 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 Chronic low back pain (Primary Area of Pain) (Bilateral) (L>R) w/ sciatica (Left) 09/14/2018  . Chronic lower extremity pain (Secondary Area of Pain) (Left) 09/14/2018  . Chronic hip pain [facility] Area of Pain) (Bilateral) (L>R) 09/14/2018  . Chronic knee pain (Fourth Area of Pain) (Bilateral) (R>L) 09/14/2018  . Pharmacologic therapy 09/14/2018  . Disorder of skeletal system 09/14/2018  . Problems  influencing health status 09/14/2018  . Primary osteoarthritis of first carpometacarpal joint of left hand 04/20/2018  . 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 01/16/2018  . PAH (pulmonary artery hypertension) (HCC) 08/03/2017  . Advanced care planning/counseling discussion 06/25/2017  . Synovial cyst of lumbar facet joint 11/13/2016  . Thoracic aortic atherosclerosis (HCC)   . CAD (coronary artery disease) 07/30/2016  . Centrilobular emphysema (HCC) 07/30/2016  . Ex-smoker 07/08/2016  . Nonspecific abnormal electrocardiogram (ECG) (EKG) 07/08/2016  . Chronic lumbar radicular pain (L4) (Bilateral) 06/09/2016  . DOE (dyspnea on exertion) 05/22/2016  . Degenerative joint disease (DJD) of hip 04/17/2016  . Somatic dysfunction of sacroiliac joint 04/17/2016  . S/P Lumbar Fusion (L3-4 PLIF) 04/17/2016  . Cervical fusion syndrome 04/17/2016  . Pedal edema 02/19/2016  . Chronic pain syndrome 02/19/2016  . Abdominal aortic atherosclerosis (HCC) 12/11/2015  . MRSA (methicillin resistant Staphylococcus aureus) infection 10/11/2012  . Urinary urgency 07/28/2012  . Infection or inflammatory reaction due to internal joint prosthesis (HCC) 12/03/2011  . Morbid obesity with BMI of 60.0-69.9, adult (HCC) 05/13/2011  . Essential hypertension 05/17/2010  . HIP PAIN 12/25/2009  . Cervical radiculopathy (Right) 12/25/2009  . Benign positional vertigo 07/21/2008  . Hyperlipidemia associated with type 2 diabetes mellitus (HCC) 08/16/2007  . Hypothyroidism 07/30/2007  . DM type 2 with diabetic peripheral neuropathy (HCC) 07/30/2007  . ANEMIA-NOS 07/30/2007  . Bipolar disorder (HCC) 07/30/2007  . Allergic rhinitis 07/30/2007  . Asthma 07/30/2007  . GERD 07/30/2007  . Osteoarthritis 07/30/2007  . OSA on CPAP 07/30/2007   PCP:  [name], MD Pharmacy:   [facility] - [place], [place] - [address] [place] [address] Phone: [phone]  Fax: [phone]     Social Determinants of Health (SDOH) Interventions  Readmission Risk Interventions Readmission Risk Prevention Plan 11/14/2019  Transportation Screening Complete  PCP or Specialist Appt within 3-5 Days Complete  HRI or [place] Complete  Social Work Consult for [place] Planning/Counseling Complete  Palliative Care Screening Not Applicable  Medication Review Press photographer) Complete  Some recent data might be hidden

## 2019-11-14 NOTE — Plan of Care (Signed)
  Problem: Clinical Measurements: Goal: Ability to maintain clinical measurements within normal limits will improve Outcome: Progressing Goal: Respiratory complications will improve Outcome: Progressing Goal: Cardiovascular complication will be avoided Outcome: Progressing   Problem: Activity: Goal: Risk for activity intolerance will decrease Outcome: Progressing   Problem: Coping: Goal: Level of anxiety will decrease Outcome: Progressing   Problem: Pain Managment: Goal: General experience of comfort will improve Outcome: Progressing   Problem: Safety: Goal: Ability to remain free from injury will improve Outcome: Progressing  Adair Patter, RN

## 2019-11-14 NOTE — Discharge Instructions (Signed)
One of your heart tests showed weakness of the heart muscle this admission. This may make you more susceptible to weight gain from fluid retention, which can lead to symptoms that we call heart failure. Please follow these special instructions:   1. Follow a low-salt diet - you are allowed no more than 2,000mg  of sodium per day. Watch your fluid intake. In general, you should not be taking in more than 2 liters of fluid per day (no more than 8 glasses per day). This includes sources of water in foods like soup, coffee, tea, milk, etc. 2. Weigh yourself on the same scale at same time of day and keep a log. 3. Call your doctor: (Anytime you feel any of the following symptoms)  - 3lb weight gain overnight or 5lb within a few days - Increased shortness of breath, with or without a dry hacking cough  - Increased swelling in the hands, feet or stomach  - If you have to sleep on extra pillows at night in order to breathe   IT IS IMPORTANT TO LET YOUR DOCTOR KNOW EARLY ON IF YOU ARE HAVING SYMPTOMS SO WE CAN HELP YOU!       Low-Sodium Eating Plan Sodium, which is an element that makes up salt, helps you maintain a healthy balance of fluids in your body. Too much sodium can increase your blood pressure and cause fluid and waste to be held in your body. Your health care provider or dietitian may recommend following this plan if you have high blood pressure (hypertension), kidney disease, liver disease, or heart failure. Eating less sodium can help lower your blood pressure, reduce swelling, and protect your heart, liver, and kidneys. What are tips for following this plan? General guidelines  Most people on this plan should limit their sodium intake to 1,500-2,000 mg (milligrams) of sodium each day. Reading food labels   The Nutrition Facts label lists the amount of sodium in one serving of the food. If you eat more than one serving, you must multiply the listed amount of sodium by the number of  servings.  Choose foods with less than 140 mg of sodium per serving.  Avoid foods with 300 mg of sodium or more per serving. Shopping  Look for lower-sodium products, often labeled as "low-sodium" or "no salt added."  Always check the sodium content even if foods are labeled as "unsalted" or "no salt added".  Buy fresh foods. ? Avoid canned foods and premade or frozen meals. ? Avoid canned, cured, or processed meats  Buy breads that have less than 80 mg of sodium per slice. Cooking  Eat more home-cooked food and less restaurant, buffet, and fast food.  Avoid adding salt when cooking. Use salt-free seasonings or herbs instead of table salt or sea salt. Check with your health care provider or pharmacist before using salt substitutes.  Cook with plant-based oils, such as canola, sunflower, or olive oil. Meal planning  When eating at a restaurant, ask that your food be prepared with less salt or no salt, if possible.  Avoid foods that contain MSG (monosodium glutamate). MSG is sometimes added to [place] food, bouillon, and some canned foods. What foods are recommended? The items listed may not be a complete list. Talk with your dietitian about what dietary choices are best for you. Grains Low-sodium cereals, including oats, puffed wheat and rice, and shredded wheat. Low-sodium crackers. Unsalted rice. Unsalted pasta. Low-sodium bread. Whole-grain breads and whole-grain pasta. Vegetables Fresh or frozen vegetables. "No  salt added" canned vegetables. "No salt added" tomato sauce and paste. Low-sodium or reduced-sodium tomato and vegetable juice. Fruits Fresh, frozen, or canned fruit. Fruit juice. Meats and other protein foods Fresh or frozen (no salt added) meat, poultry, seafood, and fish. Low-sodium canned tuna and salmon. Unsalted nuts. Dried peas, beans, and lentils without added salt. Unsalted canned beans. Eggs. Unsalted nut butters. Dairy Milk. Soy milk. Cheese that is  naturally low in sodium, such as ricotta cheese, fresh mozzarella, or Swiss cheese Low-sodium or reduced-sodium cheese. Cream cheese. Yogurt. Fats and oils Unsalted butter. Unsalted margarine with no trans fat. Vegetable oils such as canola or olive oils. Seasonings and other foods Fresh and dried herbs and spices. Salt-free seasonings. Low-sodium mustard and ketchup. Sodium-free salad dressing. Sodium-free light mayonnaise. Fresh or refrigerated horseradish. Lemon juice. Vinegar. Homemade, reduced-sodium, or low-sodium soups. Unsalted popcorn and pretzels. Low-salt or salt-free chips. What foods are not recommended? The items listed may not be a complete list. Talk with your dietitian about what dietary choices are best for you. Grains Instant hot cereals. Bread stuffing, pancake, and biscuit mixes. Croutons. Seasoned rice or pasta mixes. Noodle soup cups. Boxed or frozen macaroni and cheese. Regular salted crackers. Self-rising flour. Vegetables Sauerkraut, pickled vegetables, and relishes. Olives. [place] fries. Onion rings. Regular canned vegetables (not low-sodium or reduced-sodium). Regular canned tomato sauce and paste (not low-sodium or reduced-sodium). Regular tomato and vegetable juice (not low-sodium or reduced-sodium). Frozen vegetables in sauces. Meats and other protein foods Meat or fish that is salted, canned, smoked, spiced, or pickled. Bacon, ham, sausage, hotdogs, corned beef, chipped beef, packaged lunch meats, salt pork, jerky, pickled herring, anchovies, regular canned tuna, sardines, salted nuts. Dairy Processed cheese and cheese spreads. Cheese curds. Blue cheese. Feta cheese. String cheese. Regular cottage cheese. Buttermilk. Canned milk. Fats and oils Salted butter. Regular margarine. Ghee. Bacon fat. Seasonings and other foods Onion salt, garlic salt, seasoned salt, table salt, and sea salt. Canned and packaged gravies. Worcestershire sauce. Tartar sauce. Barbecue sauce.  Teriyaki sauce. Soy sauce, including reduced-sodium. Steak sauce. Fish sauce. Oyster sauce. Cocktail sauce. Horseradish that you find on the shelf. Regular ketchup and mustard. Meat flavorings and tenderizers. Bouillon cubes. Hot sauce and Tabasco sauce. Premade or packaged marinades. Premade or packaged taco seasonings. Relishes. Regular salad dressings. Salsa. Potato and tortilla chips. Corn chips and puffs. Salted popcorn and pretzels. Canned or dried soups. Pizza. Frozen entrees and pot pies. Summary  Eating less sodium can help lower your blood pressure, reduce swelling, and protect your heart, liver, and kidneys.  Most people on this plan should limit their sodium intake to 1,500-2,000 mg (milligrams) of sodium each day.  Canned, boxed, and frozen foods are high in sodium. Restaurant foods, fast foods, and pizza are also very high in sodium. You also get sodium by adding salt to food.  Try to cook at home, eat more fresh fruits and vegetables, and eat less fast food, canned, processed, or prepared foods. This information is not intended to replace advice given to you by your health care provider. Make sure you discuss any questions you have with your health care provider. Document Revised: 08/28/2017 Document Reviewed: 09/08/2016 Elsevier Patient Education  2020 [employer].

## 2019-11-15 ENCOUNTER — Other Ambulatory Visit: Payer: Self-pay | Admitting: *Deleted

## 2019-11-15 ENCOUNTER — Encounter: Payer: Self-pay | Admitting: *Deleted

## 2019-11-15 ENCOUNTER — Telehealth: Payer: Self-pay

## 2019-11-15 ENCOUNTER — Encounter: Payer: Self-pay | Admitting: Gastroenterology

## 2019-11-15 NOTE — Telephone Encounter (Signed)
Noted! Thank you

## 2019-11-15 NOTE — Telephone Encounter (Signed)
Patient's daughter called office today stating her mother was discharged from hospital. States that hospitalist advised patient to discontinue antibiotics.  Patient's daughter is not comfortable d/c antibiotics until she speaks with Md. Will have patient come in this week to talk with Dr. Drue Second. Lorenso Courier, [place]

## 2019-11-15 NOTE — Telephone Encounter (Signed)
plz call - let's reschedule tomorrow's appointment for some time next week (30 min appt).

## 2019-11-15 NOTE — Patient Outreach (Signed)
[facility]) Care Management  11/15/2019  Amy Barnes [birth year] [id number]   Referral received from hospital liaison as member was recently discharged from hospital after having NSTEMI.  Primary MD office listed as completing transition of care assessment.  She has had multiple ED visits and hospitalizations over the last several months, mainly for recurrent nausea/vomiting.  Per chart, she also has history of HTN, CAD, OSA, GERD, Hypothyroidism, Diabetes, chronic back pain, hyperlipidemia, bipolar, and chronic infections.    Call placed to member to follow up on discharge, identity verified.  This care manager introduced self and stated purpose of call.  [facility] care management services explained.  Report she continue to have intermittent abdominal pain with nausea/vomiting.  Lives with her husband, daughter and grandchildren who all provide support/transportation.  She was seen by GI during hospitalization, will have follow up on 2/22.  State she feel she would be much better overall if a reason was found for the pain and nausea.  State her most recent admission for the NSTEMI and new diagnosis of CHF are related to the stress on her body from the multiple admissions and uncontrolled abdominal pain.    Report she was provided some education for heart failure with her discharge paperwork but is open to receiving more information.  She is concerned about ability to afford Entresto, was provided with a coupon card to cover the first month but going forward monthly cost will be more than $200.  Open to pharmacy involvement for financial assistance.  She does monitor her blood pressure and blood sugar daily, does not check weights daily.  She has a scale but report it is not functioning properly.  Will request one to be sent from [facility].  She as follow up appointment with cardiology on 3/8, will see her PCP tomorrow.    Denies any urgent concerns, advised to contact this care manager with  questions.  Will place referral to pharmacist and follow up within the next 2 weeks.  [facility] CM Care Plan Problem One     Most Recent Value  Care Plan Problem One  Risk for readmission related to new diagnosis of heart failure  Role Documenting the Problem One  Care Management Coordinator  Care Plan for Problem One  Active  [facility] Long Term Goal   Member will not be readmitted to hospital within the next 31 days  [facility] Long Term Goal Start Date  11/15/19  Interventions for Problem One Long Term Goal  Reviewed discharge instructions with member. Provided EMMI education for heart failure management.  [facility] CM Short Term Goal #1   Member will report checking weights daily over the next 4 weeks  [facility] CM Short Term Goal #1 Start Date  11/15/19  Interventions for Short Term Goal #1  Request made for [facility] to provide member with scale.  Advised of importance of daily monitoring  [facility] CM Short Term Goal #2   Member will report taking medications as instructed over the next 3 weeks  [facility] CM Short Term Goal #2 Start Date  11/15/19  Interventions for Short Term Goal #2  Medications reviewed with member, referral placed to pharmacy for medication assistance  [facility] CM Short Term Goal #3  Member will report attending follow up appointments with GI and PCP over the next week  [facility] CM Short Term Goal #3 Start Date  11/15/19  Interventions for Short Tern Goal #3  Upcoming appointments reviewed with member, confirmed no transportation needs were present  Amy Barnes, [place], MSN [facility] Care Management  [facility] Manager [phone]

## 2019-11-15 NOTE — Telephone Encounter (Signed)
Patient's Daughter [name] called today.She stated that the patient has spent another week in the hospital and was just released yesterday. Patient does have a hospital follow up scheduled for tomorrow from the previous hospital stay that she had before this one.  They wanted to know if you would like her to keep this appointment for tomorrow or push it out a couple days     [employer]- [phone]

## 2019-11-15 NOTE — Telephone Encounter (Signed)
Patient discharged from [facility] on 11/14/2019. TCM was completed on 11/07/2019 and hospital follow up visit scheduled for 11/16/2019 @ 11:30 am. Only 1 TCM can be charged every 30 days per insurance guidelines.

## 2019-11-16 ENCOUNTER — Inpatient Hospital Stay: Payer: Self-pay | Admitting: Family Medicine

## 2019-11-16 ENCOUNTER — Ambulatory Visit (INDEPENDENT_AMBULATORY_CARE_PROVIDER_SITE_OTHER): Payer: Medicare Other | Admitting: Internal Medicine

## 2019-11-16 ENCOUNTER — Telehealth: Payer: Self-pay | Admitting: Pharmacist

## 2019-11-16 ENCOUNTER — Other Ambulatory Visit: Payer: Self-pay

## 2019-11-16 DIAGNOSIS — A4902 Methicillin resistant Staphylococcus aureus infection, unspecified site: Secondary | ICD-10-CM | POA: Diagnosis not present

## 2019-11-16 DIAGNOSIS — M4626 Osteomyelitis of vertebra, lumbar region: Secondary | ICD-10-CM

## 2019-11-16 DIAGNOSIS — T847XXD Infection and inflammatory reaction due to other internal orthopedic prosthetic devices, implants and grafts, subsequent encounter: Secondary | ICD-10-CM | POA: Diagnosis not present

## 2019-11-16 DIAGNOSIS — R1013 Epigastric pain: Secondary | ICD-10-CM

## 2019-11-16 NOTE — Progress Notes (Signed)
RFV: follow up for evisit- MRSA discitis, hardware infection. (2 identifier used. Patient at home, provider at clinic)  Patient ID: Amy Barnes, female   DOB: [birth year], 69 y.o.   MRN: [id number]  HPI Amy Barnes is a 69 yo F with MRSA discitis/HW infection who has been on long standing doxycycline chronic suppression.she recently has had difficultly with abdominal pain and poor appetite due to nausea. Had  egd which was  Normal. She has been Liquid diet for 5days but still having abdominal pain. Outpatient Encounter Medications as of 11/16/2019  Medication Sig  . albuterol (PROVENTIL HFA;VENTOLIN HFA) 108 (90 Base) MCG/ACT inhaler Inhale 2 puffs into the lungs every 6 (six) hours as needed for wheezing or shortness of breath.  Marland Kitchen aspirin 81 MG EC tablet Take 81 mg by mouth at bedtime.   . budesonide-formoterol (SYMBICORT) 160-4.5 MCG/ACT inhaler Inhale 2 puffs into the lungs 2 (two) times daily. (Patient not taking: Reported on 11/15/2019)  . carvedilol (COREG) 6.25 MG tablet Take 1 tablet (6.25 mg total) by mouth 2 (two) times daily.  . Cholecalciferol (VITAMIN D3) 25 MCG (1000 UT) CAPS Take 1 capsule (1,000 Units total) by mouth daily. (Patient not taking: Reported on 11/04/2019)  . docusate sodium (COLACE) 100 MG capsule Take 1 capsule (100 mg total) by mouth 2 (two) times daily.  Marland Kitchen FLUoxetine (PROZAC) 40 MG capsule Take by mouth daily.  . furosemide (LASIX) 20 MG tablet Take 1 tablet (20 mg total) by mouth daily.  Marland Kitchen lamoTRIgine (LAMICTAL) 200 MG tablet Take 200 mg by mouth daily.    Marland Kitchen levothyroxine (SYNTHROID) 150 MCG tablet Take 1 tablet (150 mcg total) by mouth at bedtime.  . methocarbamol (ROBAXIN-750) 750 MG tablet Take 1 tablet (750 mg total) by mouth every 6 (six) hours as needed for muscle spasms.  . montelukast (SINGULAIR) 10 MG tablet TAKE 1 TABLET BY MOUTH EVERYDAY AT BEDTIME (Patient taking differently: Take 10 mg by mouth at bedtime. )  . Multiple Vitamin (MULTIVITAMIN WITH MINERALS)  TABS tablet Take 2 tablets by mouth daily.   Marland Kitchen nystatin (MYCOSTATIN/NYSTOP) powder Apply topically 4 (four) times daily. (Patient taking differently: Apply 1 application topically 4 (four) times daily as needed (rash). )  . ondansetron (ZOFRAN ODT) 8 MG disintegrating tablet Take 1 tablet (8 mg total) by mouth 2 (two) times daily as needed for nausea or vomiting.  . Oxcarbazepine (TRILEPTAL) 300 MG tablet Take 300-600 mg by mouth See admin instructions. Take 300 mg by mouth in the morning and 600 mg at night  . oxybutynin (DITROPAN-XL) 10 MG 24 hr tablet Take 1 tablet (10 mg total) by mouth daily.  Marland Kitchen oxyCODONE 10 MG TABS Take 1 tablet (10 mg total) by mouth every 4 (four) hours as needed for severe pain ((score 7 to 10)).  Marland Kitchen pantoprazole (PROTONIX) 40 MG tablet Take 1 tablet (40 mg total) by mouth daily. (Patient taking differently: Take 40 mg by mouth 2 (two) times daily. )  . polyethylene glycol (MIRALAX / GLYCOLAX) 17 g packet Take 17 g by mouth 2 (two) times daily as needed for mild constipation.  . promethazine (PHENERGAN) 25 MG suppository Place 1 suppository (25 mg total) rectally every 6 (six) hours as needed for nausea or vomiting.  . sacubitril-valsartan (ENTRESTO) 24-26 MG Take 1 tablet by mouth 2 (two) times daily.  . simvastatin (ZOCOR) 40 MG tablet Take 40 mg by mouth every evening.  . sucralfate (CARAFATE) 1 g tablet Take 1 tablet (1 g total) by  mouth 4 (four) times daily -  with meals and at bedtime.  . traMADol (ULTRAM) 50 MG tablet Take 50-100 mg by mouth every 6 (six) hours as needed for moderate pain.    No facility-administered encounter medications on file as of 11/16/2019.     Patient Active Problem List   Diagnosis Date Noted  . Elevated troponin   . NSTEMI (non-ST elevated myocardial infarction) (HCC) 11/09/2019  . Acute respiratory failure with hypoxia (HCC) 11/09/2019  . Acute pulmonary edema (HCC) 11/09/2019  . Abdominal pain with vomiting 11/09/2019  . QT  prolongation 11/09/2019  . Acute kidney injury (HCC) 11/04/2019  . Hyponatremia 11/03/2019  . Epigastric abdominal pain 11/03/2019  . Non-intractable vomiting 11/03/2019  . Diminished pulses in lower extremity 08/11/2019  . Osteopenia 08/11/2019  . Wound infection complicating hardware (HCC) 04/01/2019  . 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 Bilateral lower extremity edema 11/29/2018  . Neurogenic urinary incontinence 11/29/2018  . Urinary incontinence 11/29/2018  . Bladder spasms 11/29/2018  . Discogenic low back pain 11/29/2018  . Chronic sacroiliac joint pain (Bilateral) (R>L) 11/29/2018  . Spondylosis without myelopathy or radiculopathy, lumbar region 11/09/2018  . Chronic low back pain (Bilateral) (L>R) w/o sciatica 11/09/2018  . History of hip replacement (Left) 10/13/2018  . History of back surgery 10/13/2018  . Vitamin D insufficiency 10/13/2018  . Osteoarthritis of facet joint of lumbar spine 10/13/2018  . Osteoarthritis involving multiple joints 10/13/2018  . Cervical radiculopathy (Left) 10/13/2018  . Lumbar facet arthropathy (Bilateral) 10/13/2018  . Lumbar facet syndrome (Bilateral) (L>R) 10/13/2018  . Abnormal MRI, lumbar spine (09/16/2018) 10/13/2018  . Lumbar nerve root compression (L4) (Left) 10/13/2018  . Lumbar foraminal stenosis 10/13/2018  . 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 Chronic low back pain (Primary Area of Pain) (Bilateral) (L>R) w/ sciatica (Left) 09/14/2018  . Chronic lower extremity pain (Secondary Area of Pain) (Left) 09/14/2018  . Chronic hip pain [facility] Area of Pain) (Bilateral) (L>R) 09/14/2018  . Chronic knee pain (Fourth Area of Pain) (Bilateral) (R>L) 09/14/2018  . Pharmacologic therapy 09/14/2018  . Disorder of skeletal system 09/14/2018  . Problems influencing health status 09/14/2018  . Primary osteoarthritis of first  carpometacarpal joint of left hand 04/20/2018  . 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 01/16/2018  . PAH (pulmonary artery hypertension) (HCC) 08/03/2017  . Advanced care planning/counseling discussion 06/25/2017  . Synovial cyst of lumbar facet joint 11/13/2016  . Thoracic aortic atherosclerosis (HCC)   . CAD (coronary artery disease) 07/30/2016  . Centrilobular emphysema (HCC) 07/30/2016  . Ex-smoker 07/08/2016  . Nonspecific abnormal electrocardiogram (ECG) (EKG) 07/08/2016  . Chronic lumbar radicular pain (L4) (Bilateral) 06/09/2016  . DOE (dyspnea on exertion) 05/22/2016  . Degenerative joint disease (DJD) of hip 04/17/2016  . Somatic dysfunction of sacroiliac joint 04/17/2016  . S/P Lumbar Fusion (L3-4 PLIF) 04/17/2016  . Cervical fusion syndrome 04/17/2016  . Pedal edema 02/19/2016  . Chronic pain syndrome 02/19/2016  . Abdominal aortic atherosclerosis (HCC) 12/11/2015  . MRSA (methicillin resistant Staphylococcus aureus) infection 10/11/2012  . Urinary urgency 07/28/2012  . Infection or inflammatory reaction due to internal joint prosthesis (HCC) 12/03/2011  . Morbid obesity with BMI of 60.0-69.9, adult (HCC) 05/13/2011  . Essential hypertension 05/17/2010  . HIP PAIN 12/25/2009  . Cervical radiculopathy (Right) 12/25/2009  . Benign positional vertigo 07/21/2008  . Hyperlipidemia associated with type 2 diabetes mellitus (HCC) 08/16/2007  . Hypothyroidism  07/30/2007  . DM type 2 with diabetic peripheral neuropathy ([place]) 07/30/2007  . ANEMIA-NOS 07/30/2007  . Bipolar disorder ([place]) 07/30/2007  . Allergic rhinitis 07/30/2007  . Asthma 07/30/2007  . GERD 07/30/2007  . Osteoarthritis 07/30/2007  . OSA on CPAP 07/30/2007     Health Maintenance Due  Topic Date Due  . DTAP VACCINES (1) 08/07/1951  . PNA vac Low Risk Adult (2 of 2 - PPSV23) 01/13/2019     Review of Systems 12 point negative except for hpi, as well as chronic back pain. Physical Exam   LMP  (LMP  Unknown)    Objective: no exam, televisit CBC Lab Results  Component Value Date   WBC 6.8 11/14/2019   RBC 3.66 (L) 11/14/2019   HGB 10.4 (L) 11/14/2019   HCT 32.4 (L) 11/14/2019   PLT 211 11/14/2019   MCV 88.5 11/14/2019   MCH 28.4 11/14/2019   MCHC 32.1 11/14/2019   RDW 15.9 (H) 11/14/2019   LYMPHSABS 0.9 11/08/2019   MONOABS 0.2 11/08/2019   EOSABS 0.0 11/08/2019    BMET Lab Results  Component Value Date   NA 136 11/14/2019   K 4.0 11/14/2019   CL 96 (L) 11/14/2019   CO2 30 11/14/2019   GLUCOSE 130 (H) 11/14/2019   BUN 10 11/14/2019   CREATININE 0.71 11/14/2019   CALCIUM 8.8 (L) 11/14/2019   GFRNONAA >60 11/14/2019   GFRAA >60 11/14/2019      Assessment and Plan  mrsa discitis = will plan toTemporarily stop doxycycline 100mg  bid to see if improves symptoms  Epigastric Abdominal pain =Sees [facility] to help with symptoms management - next Monday for further work up  Will have televist next week to see if any improvement, may need to change abtx for chronic suppression

## 2019-11-16 NOTE — Patient Outreach (Signed)
[facility]) Care Management  [place]   11/16/2019  DESMOND TUFANO 1951-05-01 [id number]  Reason for referral: medication assistance  Referral source: [facility]  Referral medication(s): Delene Loll, Dulera/Symbicort Current insurance:[employer]  HPI:  Patient is a 69 year old female with multiple medical conditions including but not limited to:  Allergic rhinitis, asthma, bipolar disorder, bladder spasm, Chronic low back pain, type 2 diabetes, GERD, hypothyroidism, morbid obesity, osteoarthritis, and vitamin d deficiency. Patient recently visited the ED for nausea, vomiting, and NSTEMI.  Objective: Allergies  Allergen Reactions  . Cephalexin Hives  . Hydrocodone Other (See Comments)    Reaction:  Hallucinations   . Tolterodine Tartrate Nausea And Vomiting  . Risperidone And Related Other (See Comments)    Reaction:  Made pt excessively sleepy  . Seroquel [Quetiapine Fumarate] Other (See Comments)    Reaction:  Made pt excessively sleepy  . Sulfa Antibiotics Rash    Medications Reviewed Today    Reviewed by Elayne Guerin, [facility] (Pharmacist) on 11/16/19 at 1251  Med List Status: <None>  Medication Order Taking? Sig Documenting Provider Last Dose Status Informant  albuterol (PROVENTIL HFA;VENTOLIN HFA) 108 (90 Base) MCG/ACT inhaler [id number] Yes Inhale 2 puffs into the lungs every 6 (six) hours as needed for wheezing or shortness of breath. Ria Bush, MD Taking Active Self  aspirin 81 MG EC tablet [id number] Yes Take 81 mg by mouth at bedtime.  [provider] Taking Active Self  budesonide-formoterol (SYMBICORT) 160-4.5 MCG/ACT inhaler [id number]  Inhale 2 puffs into the lungs 2 (two) times daily.  Patient not taking: Reported on 11/15/2019   Ria Bush, MD  Active Self  carvedilol (COREG) 6.25 MG tablet [id number] Yes Take 1 tablet (6.25 mg total) by mouth 2 (two) times daily. Darliss Cheney, MD Taking Active   Cholecalciferol (VITAMIN D3)  25 MCG (1000 UT) CAPS [id number] No Take 1 capsule (1,000 Units total) by mouth daily.  Patient not taking: Reported on 11/04/2019   Ria Bush, MD Not Taking Active Self  docusate sodium (COLACE) 100 MG capsule [id number] Yes Take 1 capsule (100 mg total) by mouth 2 (two) times daily. Cristal Deer, MD Taking Active Self  FLUoxetine (PROZAC) 40 MG capsule [id number] Yes Take by mouth daily. [provider] Taking Active Self  furosemide (LASIX) 20 MG tablet [id number] Yes Take 1 tablet (20 mg total) by mouth daily. Darliss Cheney, MD Taking Active            Med Note Arnette Schaumann Nov 16, 2019 12:49 PM) PRN  lamoTRIgine (LAMICTAL) 200 MG tablet [id number] Yes Take 200 mg by mouth daily.   [provider] Taking Active Self  levothyroxine (SYNTHROID) 150 MCG tablet [id number] Yes Take 1 tablet (150 mcg total) by mouth at bedtime. Ria Bush, MD Taking Active Self  methocarbamol (ROBAXIN-750) 750 MG tablet [id number] Yes Take 1 tablet (750 mg total) by mouth every 6 (six) hours as needed for muscle spasms. Viona Gilmore D, NP Taking Active Self  montelukast (SINGULAIR) 10 MG tablet [id number] Yes TAKE 1 TABLET BY MOUTH EVERYDAY AT BEDTIME  Patient taking differently: Take 10 mg by mouth at bedtime.    Ria Bush, MD Taking Active Self  Multiple Vitamin (MULTIVITAMIN WITH MINERALS) TABS tablet [id number] Yes Take 2 tablets by mouth daily.  [provider] Taking Active Self  nystatin (MYCOSTATIN/NYSTOP) powder [id number] Yes Apply topically 4 (four) times daily.  Patient taking differently: Apply 1 application topically 4 (four)  times daily as needed (rash).    Reva Bores, MD Taking Active Self  ondansetron (ZOFRAN ODT) 8 MG disintegrating tablet [id number] Yes Take 1 tablet (8 mg total) by mouth 2 (two) times daily as needed for nausea or vomiting. Hughie Closs, MD Taking Active   Oxcarbazepine (TRILEPTAL) 300 MG tablet [id number] Yes Take 300-600  mg by mouth See admin instructions. Take 300 mg by mouth in the morning and 600 mg at night [provider] Taking Active Self  oxybutynin (DITROPAN-XL) 10 MG 24 hr tablet [id number] Yes Take 1 tablet (10 mg total) by mouth daily. Jerilee Field, MD Taking Active Self           Med Note Para March, MACI D   Tue Nov 08, 2019  9:34 PM) Pt not taking believes it is what is making her sick.   oxyCODONE 10 MG TABS [id number] Yes Take 1 tablet (10 mg total) by mouth every 4 (four) hours as needed for severe pain ((score 7 to 10)). Val Eagle D, NP Taking Active Self  pantoprazole (PROTONIX) 40 MG tablet [id number] Yes Take 1 tablet (40 mg total) by mouth daily.  Patient taking differently: Take 40 mg by mouth 2 (two) times daily.    [name], MD Taking Active Self           Med Note Maurice March, [place]ICA   Tue Nov 15, 2019  3:34 PM) Taking twice a day  polyethylene glycol (MIRALAX / GLYCOLAX) 17 g packet [id number] Yes Take 17 g by mouth 2 (two) times daily as needed for mild constipation. Hughie Closs, MD Taking Active   promethazine (PHENERGAN) 25 MG suppository [id number] Yes Place 1 suppository (25 mg total) rectally every 6 (six) hours as needed for nausea or vomiting. [name], MD Taking Active Self           Med Note Para March, [place] D   Tue Nov 08, 2019  9:34 PM) Has not picked up yet  sacubitril-valsartan (ENTRESTO) 24-26 MG [id number] Yes Take 1 tablet by mouth 2 (two) times daily. Hughie Closs, MD Taking Active   simvastatin (ZOCOR) 40 MG tablet [id number] Yes Take 40 mg by mouth every evening. [provider] Taking Active Self  sucralfate (CARAFATE) 1 g tablet [id number] Yes Take 1 tablet (1 g total) by mouth 4 (four) times daily -  with meals and at bedtime. [name], MD Taking Active Self  traMADol Janean Sark) 50 MG tablet [id number] Yes Take 50-100 mg by mouth every 6 (six) hours as needed for moderate pain.  [provider] Taking Active Self            Med Note Cyndie Chime, [place] I   Fri Nov 04, 2019  3:24 PM)    Med List Note Newman Pies, RN 09/14/18 1132): UDS 09/14/18            Assessment:  Drugs sorted by system:  Neurologic/Psychologic: Fluoxetine, Lamotrigine, Oxcarbazepine,   Cardiovascular: Aspirin, Carvedilol, Furosemide, Entresto, Simvastatin,   Pulmonary/Allergy: Ventolin HFA, Symbicort (Not taking due to cost), Montelukast,   Gastrointestinal: Docusate Sodium, Ondansetron ODT, Pantoprazole, Polyethylene Glycol, Promethazine suppositories, Sucralfate,   Topical: Nystatin,   Pain: Methocarbamol, Oxycodone, Tramadol  Genitourinary: Oxybutynin  Vitamins/Minerals/Supplements: Cholecalciferol, Multiple Vitamin,        Medication Review Findings:  . Potential for CNS Depression-methocarbamol/Oxcarbazepine/Oxycodone/Tramadol   Medication Assistance Findings:  Medication assistance needs identified: Symbicort, Proventil HFA, Entresto  Over income for LIS/Extra Help Symbicort-AZ& Me Proventil HFA- [employer] Entresto-Nov[employer]is  Additional medication  assistance options reviewed with patient as warranted:  No other options identified  Plan: I will route patient assistance letter to [facility] pharmacy technician who will coordinate patient assistance program application process for medications listed above.  [facility] pharmacy technician will assist with obtaining all required documents from both patient and provider(s) and submit application(s) once completed.   Noreene Larsson will follow up for med assistance. Call patient back in 4-6 weeks.  [name], PharmD, BCACP [facility] Clinical Pharmacist [phone]

## 2019-11-16 NOTE — Telephone Encounter (Signed)
Pt has been r/s on 11/25/19 at 11:30.

## 2019-11-17 ENCOUNTER — Ambulatory Visit: Payer: Medicare Other | Admitting: Internal Medicine

## 2019-11-17 ENCOUNTER — Ambulatory Visit: Payer: Medicare Other | Admitting: Gastroenterology

## 2019-11-18 MED ORDER — SUCRALFATE 1 GM/10ML PO SUSP: 1.0000 g | Freq: Three times a day (TID) | ORAL | 0 refills | Status: DC

## 2019-11-18 NOTE — Addendum Note (Signed)
Addended by: [name] on: 11/18/2019 09:44 AM   Modules accepted: Orders

## 2019-11-20 NOTE — Progress Notes (Signed)
This visit was conducted in person.  BP 130/68 (BP Location: Left Arm, Patient Position: Sitting, Cuff Size: Large) Comment (Cuff Size): Thigh cuff  Pulse 70   Temp (!) 97.5 F (36.4 C) (Temporal)   Ht 5\' 4"  (1.626 m)   Wt (!) 371 lb 1 oz (168.3 kg)   LMP  (LMP Unknown)   SpO2 97%   BMI 63.69 kg/m    CC: hospital f/u visit Subjective:    Patient ID: , female    DOB: 1951-06-28, 69 y.o.   MRN: [id number]  HPI: Amy Barnes is a 69 y.o. female presenting on 11/25/2019 for Hospitalization Follow-up (Pt accompanied by daughter, 11/27/2019- temp 97.6.)   Recent hospitalization for intractable nausea/vomiting and abdominal pain, admitted for non-STEMI with acute sCHF exacerbation. D dimer was elevated however CTA chest was negative for PE. It did show venous congestion consistent with CHF exacerbation. She had reassuringly non obstructive cardiac catheterization while hospitalized with echo showing EF 35-40% and severe mitral valve calcification without stenosis. She subsequently underwent reassuring EGD. CT abd/pelvis imaging and gastric emptying study were unrevealing for etiology.   She was started on carvedilol, losartan and lasix during hospitalization - losartan was transitioned to [facility] prior to discharge - but very expensive. Have 30 day supply at home.  Planned cardiology f/u 12/05/2019 - consider rpt echo.   Since home, episode of vomiting last week but no further vomiting since then. Appetite increasing, stools becoming more consistent. No leg swelling or worsening dyspnea.  Has not needed ditropan XL. Was taking oxycodone about once a week - none in the past week. Celebrex was stopped.   Recommendation to pursue outpatient sleep study for further evaluation of sleep apnea   Saw GI in follow up - normal EGD, normal gastric emptying study, unrevealing CT abd/pelvis. Planned HIDA scan, but not thought consistent with biliary dyskinesia. Thought more related to med side  effects. rec start bentyl 20mg  BID.   Was advised to stop doxycycline for her h/o MSSA discitis with hardware infection - saw ID 02/04/2020) phone visit who recommended fosfomycin for UTI and fluconazole 200mg  daily 10d course for candidal skin infection.   Admit date: 11/08/2019 Discharge date: 11/14/2019 TCM hosp f/u phone call completed on 11/15/2019 - however pt does not qualify for TCM visit given recent rehospitalization.   Admitted From: Home Disposition: Home  Recommendations for Outpatient Follow-up:  1. Follow up with PCP in 1-2 weeks 2. Follow with GI in 2 to 4 weeks 3. Follow with cardiology per their recommendations and schedule 4. Please obtain BMP/CBC in one week 5. Please follow up on the following pending results:  Discharge Diagnoses:  Principal Problem:   NSTEMI (non-ST elevated myocardial infarction) (HCC) Active Problems:   Acute respiratory failure with hypoxia (HCC)   Acute pulmonary edema (HCC)   Abdominal pain with vomiting   QT prolongation   Elevated troponin  Home Health: None Equipment/Devices: None  Discharge Condition: Stable CODE STATUS: Full code Diet recommendation: Cardiac/low-sodium     Relevant past medical, surgical, family and social history reviewed and updated as indicated. Interim medical history since our last visit reviewed. Allergies and medications reviewed and updated. Outpatient Medications Prior to Visit  Medication Sig Dispense Refill  . albuterol (PROVENTIL HFA;VENTOLIN HFA) 108 (90 Base) MCG/ACT inhaler Inhale 2 puffs into the lungs every 6 (six) hours as needed for wheezing or shortness of breath. 1 Inhaler 6  . aspirin 81 MG EC tablet Take 81 mg  by mouth at bedtime.     . budesonide-formoterol (SYMBICORT) 160-4.5 MCG/ACT inhaler Inhale 2 puffs into the lungs 2 (two) times daily. 1 Inhaler 3  . carvedilol (COREG) 6.25 MG tablet Take 1 tablet (6.25 mg total) by mouth 2 (two) times daily. 60 tablet 0  . dicyclomine (BENTYL) 20  MG tablet Take 1 tablet (20 mg total) by mouth in the morning and at bedtime. 60 tablet 2  . docusate sodium (COLACE) 100 MG capsule Take 1 capsule (100 mg total) by mouth 2 (two) times daily. 10 capsule 0  . doxycycline (VIBRA-TABS) 100 MG tablet Take 1 tablet (100 mg total) by mouth 2 (two) times daily for 7 days. 14 tablet 3  . fluconazole (DIFLUCAN) 200 MG tablet Take 1 tablet (200 mg total) by mouth daily for 10 days. 10 tablet 1  . FLUoxetine (PROZAC) 40 MG capsule Take by mouth daily.    . fosfomycin (MONUROL) 3 g PACK Take 3 g by mouth once for 1 dose. 3 g 0  . furosemide (LASIX) 20 MG tablet Take 1 tablet (20 mg total) by mouth daily. 30 tablet 0  . lamoTRIgine (LAMICTAL) 200 MG tablet Take 200 mg by mouth daily.      Marland Kitchen levothyroxine (SYNTHROID) 150 MCG tablet Take 1 tablet (150 mcg total) by mouth at bedtime. 90 tablet 3  . Multiple Vitamin (MULTIVITAMIN WITH MINERALS) TABS tablet Take 2 tablets by mouth daily.     . ondansetron (ZOFRAN ODT) 8 MG disintegrating tablet Take 1 tablet (8 mg total) by mouth 2 (two) times daily as needed for nausea or vomiting. 10 tablet 0  . Oxcarbazepine (TRILEPTAL) 300 MG tablet Take 300-600 mg by mouth See admin instructions. Take 300 mg by mouth in the morning and 600 mg at night    . oxybutynin (DITROPAN-XL) 10 MG 24 hr tablet Take 1 tablet (10 mg total) by mouth daily. 30 tablet 11  . oxyCODONE 10 MG TABS Take 1 tablet (10 mg total) by mouth every 4 (four) hours as needed for severe pain ((score 7 to 10)). 42 tablet 0  . polyethylene glycol (MIRALAX / GLYCOLAX) 17 g packet Take 17 g by mouth 2 (two) times daily as needed for mild constipation. 14 each 0  . prochlorperazine (COMPAZINE) 10 MG tablet Take 1 tablet (10 mg total) by mouth every 6 (six) hours as needed for nausea or vomiting. 30 tablet 1  . promethazine (PHENERGAN) 25 MG suppository Place 1 suppository (25 mg total) rectally every 6 (six) hours as needed for nausea or vomiting. 12 each 0  .  sacubitril-valsartan (ENTRESTO) 24-26 MG Take 1 tablet by mouth 2 (two) times daily. 60 tablet 0  . simvastatin (ZOCOR) 40 MG tablet Take 40 mg by mouth every evening.    . sucralfate (CARAFATE) 1 GM/10ML suspension Take 10 mLs (1 g total) by mouth 4 (four) times daily -  with meals and at bedtime. 420 mL 0  . zaleplon (SONATA) 10 MG capsule Take 10 mg by mouth at bedtime as needed for sleep.    . montelukast (SINGULAIR) 10 MG tablet TAKE 1 TABLET BY MOUTH EVERYDAY AT BEDTIME (Patient taking differently: Take 10 mg by mouth at bedtime. ) 90 tablet 2  . pantoprazole (PROTONIX) 40 MG tablet Take 1 tablet (40 mg total) by mouth daily. (Patient taking differently: Take 40 mg by mouth 2 (two) times daily. ) 90 tablet 3   No facility-administered medications prior to visit.  Per HPI unless specifically indicated in ROS section below Review of Systems Objective:    BP 130/68 (BP Location: Left Arm, Patient Position: Sitting, Cuff Size: Large) Comment (Cuff Size): Thigh cuff  Pulse 70   Temp (!) 97.5 F (36.4 C) (Temporal)   Ht 5\' 4"  (1.626 m)   Wt (!) 371 lb 1 oz (168.3 kg)   LMP  (LMP Unknown)   SpO2 97%   BMI 63.69 kg/m   Wt Readings from Last 3 Encounters:  11/25/19 (!) 371 lb 1 oz (168.3 kg)  11/21/19 (!) 363 lb (164.7 kg)  11/14/19 (!) 372 lb 6.4 oz (168.9 kg)    Physical Exam Vitals and nursing note reviewed.  Constitutional:      General: She is not in acute distress.    Appearance: Normal appearance. She is morbidly obese.  Cardiovascular:     Rate and Rhythm: Normal rate and regular rhythm.     Pulses: Normal pulses.     Heart sounds: Normal heart sounds. No murmur.  Pulmonary:     Effort: Pulmonary effort is normal. No respiratory distress.     Breath sounds: Normal breath sounds. No wheezing, rhonchi or rales.  Abdominal:     Palpations: Abdomen is soft.     Tenderness: There is no abdominal tenderness. There is no guarding.  Musculoskeletal:     Right lower leg:  Edema present.     Left lower leg: Edema present.     Comments: Chronic nonpitting BLE edema  Neurological:     Mental Status: She is alert.  Psychiatric:        Mood and Affect: Mood normal.        Behavior: Behavior normal.       Results for orders placed or performed in visit on 11/21/19  Urinalysis, Routine w reflex microscopic  Result Value Ref Range   Color, Urine YELLOW Yellow;Lt. Yellow;Straw;Dark Yellow;Amber;Green;Red;Brown   APPearance Cloudy (A) Clear;Turbid;Slightly Cloudy;Cloudy   Specific Gravity, Urine 1.010 1.000 - 1.030   pH 8.5 (A) 5.0 - 8.0   Total Protein, Urine NEGATIVE Negative   Urine Glucose NEGATIVE Negative   Ketones, ur NEGATIVE Negative   Bilirubin Urine NEGATIVE Negative   Hgb urine dipstick NEGATIVE Negative   Urobilinogen, UA 0.2 0.0 - 1.0   Leukocytes,Ua SMALL (A) Negative   Nitrite NEGATIVE Negative   WBC, UA 7-10/hpf (A) 0-2/hpf   RBC / HPF none seen 0-2/hpf   Mucus, UA Presence of (A) None   Squamous Epithelial / LPF Rare(0-4/hpf) Rare(0-4/hpf)   Bacteria, UA Few(10-50/hpf) (A) None   Lab Results  Component Value Date   TSH 1.050 11/09/2019    Lab Results  Component Value Date   HGBA1C 7.1 (H) 11/09/2019    Assessment & Plan:  This visit occurred during the SARS-CoV-2 public health emergency.  Safety protocols were in place, including screening questions prior to the visit, additional usage of staff PPE, and extensive cleaning of exam room while observing appropriate contact time as indicated for disinfecting solutions.   Problem List Items Addressed This Visit    UTI (urinary tract infection)    Recently saw ID, planned treatment for abnormal UA during hospitalization with fosfomycin. UCx was not sent.       Urinary incontinence (Chronic)    Has not used oxybutynin recently. rec sparing use.  01/07/2020 thought to have caused acute issue with nausea/vomiting.       NSTEMI (non-ST elevated myocardial infarction) (HCC)    New during  recent hospitalization, thought due to acute illness stressors. Appreciate cards care - planned f/u next month.       Nausea and vomiting - Primary    This has improved. Largely unrevealing GI workup (EGD, stomach emptying study, CT scan). Pending HIDA scan. Continue PPI BID, start taper off carafate.       Relevant Orders   Comprehensive metabolic panel   CBC with Differential/Platelet   GERD    Continue BID PPI for now, discussed trying to taper off carafate.       Relevant Medications   pantoprazole (PROTONIX) 40 MG tablet   Essential hypertension    Stable period on carvedilol, losartan, lasix.       DM type 2 with diabetic peripheral neuropathy (HCC)    Overall stable period off antihyperglycemic.       ANEMIA-NOS    Update labs.       Acute HFrEF (heart failure with reduced ejection fraction) ([place])    New during recent hospitalization, EF on echo down to 35-40%, thought due to acute illness stressors. Appreciate cards care - planned f/u next month.  Unable to afford entresto but does have 30d supply at home - rec losartan once they run out of entresto and f/u with cards.           Meds ordered this encounter  Medications  . montelukast (SINGULAIR) 10 MG tablet    Sig: Take 1 tablet (10 mg total) by mouth at bedtime.    Dispense:  90 tablet    Refill:  3  . pantoprazole (PROTONIX) 40 MG tablet    Sig: Take 1 tablet (40 mg total) by mouth 2 (two) times daily.    Dispense:  180 tablet    Refill:  1   Orders Placed This Encounter  Procedures  . Comprehensive metabolic panel  . CBC with Differential/Platelet    Patient Instructions  Labs today. Continue current medicines.  Try to back off carafate for now.  Will see cardiology evaluation.    Follow up plan: Return in about 2 months (around 01/23/2020) for medicare wellness visit.  Ria Bush, MD

## 2019-11-21 ENCOUNTER — Ambulatory Visit (INDEPENDENT_AMBULATORY_CARE_PROVIDER_SITE_OTHER): Payer: Medicare Other | Admitting: Gastroenterology

## 2019-11-21 ENCOUNTER — Encounter: Payer: Self-pay | Admitting: Gastroenterology

## 2019-11-21 ENCOUNTER — Other Ambulatory Visit (INDEPENDENT_AMBULATORY_CARE_PROVIDER_SITE_OTHER): Payer: Medicare Other

## 2019-11-21 VITALS — BP 132/60 | HR 70 | Temp 96.4°F | Ht 64.0 in | Wt 363.0 lb

## 2019-11-21 DIAGNOSIS — R109 Unspecified abdominal pain: Secondary | ICD-10-CM

## 2019-11-21 DIAGNOSIS — R112 Nausea with vomiting, unspecified: Secondary | ICD-10-CM

## 2019-11-21 LAB — URINALYSIS, ROUTINE W REFLEX MICROSCOPIC
Bilirubin Urine: NEGATIVE
Hgb urine dipstick: NEGATIVE
Ketones, ur: NEGATIVE
Nitrite: NEGATIVE
RBC / HPF: NONE SEEN (ref 0–?)
Specific Gravity, Urine: 1.01 (ref 1.000–1.030)
Total Protein, Urine: NEGATIVE
Urine Glucose: NEGATIVE
Urobilinogen, UA: 0.2 (ref 0.0–1.0)
pH: 8.5 — AB (ref 5.0–8.0)

## 2019-11-21 MED ORDER — PROCHLORPERAZINE MALEATE 10 MG PO TABS: 10.0000 mg | ORAL_TABLET | Freq: Four times a day (QID) | ORAL | 1 refills | Status: DC | PRN

## 2019-11-21 MED ORDER — DICYCLOMINE HCL 20 MG PO TABS: 20.0000 mg | ORAL_TABLET | Freq: Two times a day (BID) | ORAL | 2 refills | Status: DC

## 2019-11-21 NOTE — Patient Instructions (Addendum)
If you are age 68 or older, your body mass index should be between 23-30. Your Body mass index is 63.92 kg/m. If this is out of the aforementioned range listed, please consider follow up with your Primary Care Provider.  Your provider has requested that you go to the basement level for lab work before leaving today. Press "B" on the elevator. The lab is located at the first door on the left as you exit the elevator.  We have sent the following medications to your pharmacy for you to pick up at your convenience: Compazine, Bentyl   Due to recent changes in healthcare laws, you may see the results of your imaging and laboratory studies on MyChart before your provider has had a chance to review them.  We understand that in some cases there may be results that are confusing or concerning to you. Not all laboratory results come back in the same time frame and the provider may be waiting for multiple results in order to interpret others.  Please give [place] 48 hours in order for your provider to thoroughly review all the results before contacting the office for clarification of your results.   You have been scheduled for a HIDA scan at [facility] (1st floor) on 12/05/19. Please arrive 15 minutes prior to your scheduled appointment at  3:78HY. Make certain not to have anything to eat or drink at least 6 hours prior to your test. Should this appointment date or time not work well for you, please call radiology scheduling at [phone].  _____________________________________________________________________ hepatobiliary (HIDA) scan is an imaging procedure used to diagnose problems in the liver, gallbladder and bile ducts. In the HIDA scan, a radioactive chemical or tracer is injected into a vein in your arm. The tracer is handled by the liver like bile. Bile is a fluid produced and excreted by your liver that helps your digestive system break down fats in the foods you eat. Bile is stored in your  gallbladder and the gallbladder releases the bile when you eat a meal. A special nuclear medicine scanner (gamma camera) tracks the flow of the tracer from your liver into your gallbladder and small intestine.  During your HIDA scan  You'll be asked to change into a hospital gown before your HIDA scan begins. Your health care team will position you on a table, usually on your back. The radioactive tracer is then injected into a vein in your arm.The tracer travels through your bloodstream to your liver, where it's taken up by the bile-producing cells. The radioactive tracer travels with the bile from your liver into your gallbladder and through your bile ducts to your small intestine.You may feel some pressure while the radioactive tracer is injected into your vein. As you lie on the table, a special gamma camera is positioned over your abdomen taking pictures of the tracer as it moves through your body. The gamma camera takes pictures continually for about an hour. You'll need to keep still during the HIDA scan. This can become uncomfortable, but you may find that you can lessen the discomfort by taking deep breaths and thinking about other things. Tell your health care team if you're uncomfortable. The radiologist will watch on a computer the progress of the radioactive tracer through your body. The HIDA scan may be stopped when the radioactive tracer is seen in the gallbladder and enters your small intestine. This typically takes about an hour. In some cases extra imaging will be performed if original images  aren't satisfactory, if morphine is given to help visualize the gallbladder or if the medication CCK is given to look at the contraction of the gallbladder. This test typically takes 2 hours to complete. ________________________________________________________________________   Thank you for choosing me and [facility].  Shanda Bumps Zehr-PA

## 2019-11-21 NOTE — Progress Notes (Signed)
11/21/2019 Amy Barnes [id number] [birth year]   HISTORY OF PRESENT ILLNESS: This is a 69 year old female who was just seen at [facility] in consult by Dr. Elnoria Howard 1 week ago and then underwent EGD by Dr. Myrtie Neither as he was covering for the weekend.  Patient has established GI with Dr. Shelle Iron in [place] as well where she had her colonoscopy performed in 2017.  Patient ended up scheduling follow-up here in our office, however.  Nonetheless, she is here today with her husband and they put their  daughter on speaker phone during our visit.  The daughter provided the history stating that her mother has been vomiting for the past 3 weeks.  She has been able to tolerate liquids, but not able to tolerate much in the way of food.  Reports abdominal pain in the mid to left upper abdomen.  As stated above she underwent EGD with Dr. Myrtie Neither on February 13 at which time the study was normal except a small amount of food residue in the stomach.  She ended up undergoing gastric emptying scan as well and that was normal.  Her diabetes is well controlled with a hemoglobin A1c of 7.1.  She has had 2 CT scans performed in the month of February, 1 with contrast in 1 without.  Neither of these have indicated any issue to be causing any of her symptoms.  She is currently alternating Phenergan, Compazine, and Zofran to control her nausea.  She has Bentyl on her medication list, but is just listed as needed and they are asking about that medication.  They said that this all initially began when she started taking Detrol for her bladder and were wondering if she had some type of reaction to that.  Her daughter also expresses concern that she has some type of "blockage".  Once again, she had colonoscopy performed in April 2017 at which time she is only found to have diverticulosis and internal hemorrhoids.  She reports that she has had similar symptoms to this in the past and it looks like colonoscopy indication was for similar  symptoms.  Past Medical History:  Diagnosis Date  . Allergic rhinitis   . Ambulates with cane    straight  . Anemia   . Anxiety   . Asthma    seasonal  . Bipolar affective disorder (HCC)    takes Synthroid meds for Bipolar  . CAD (coronary artery disease) 07/2016   by CT scan  . Carpal tunnel syndrome    had surgery but occasional still has some issues per patient  . Cataract   . Centrilobular emphysema (HCC) 07/2016   by CT scan - pt not aware of this  . Constipation due to pain medication   . Depression with anxiety   . Diabetes mellitus    type 2 - no meds diet controlled  . GERD (gastroesophageal reflux disease)   . History of blood transfusion   . 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 Hyperlipidemia   . Hypertension   . OSA (obstructive sleep apnea)    no longer using cpap, uses a bed that raises and lowers hob  . Osteoarthritis   . Osteopenia 08/11/2019   DEXA 07/2019 - T -1.1 R femur (osteopenia)  . Pneumonia   . Restless legs   . Septic arthritis (HCC) 10/11/2012  . Shingles 06/30/2016  . 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 Thoracic aortic atherosclerosis (HCC) 11/207   by CT   Past Surgical History:  Procedure Laterality Date  . BREAST CYST ASPIRATION    . CARPAL TUNNEL RELEASE Right 05/15/2011  . CARPAL TUNNEL RELEASE Left 12/24/2017   Procedure: LEFT CARPAL TUNNEL RELEASE;  Surgeon: [name], MD;  Location: [facility];  Service: Orthopedics;  Laterality: Left;  . CERVICAL FUSION  2012   C2/3/4  . COLONOSCOPY WITH PROPOFOL N/A 12/31/2015   diverticulosis, int hem, o/w normal rpt 10 yrs (Rein)  . ESOPHAGOGASTRODUODENOSCOPY (EGD) WITH PROPOFOL N/A 11/12/2019   Procedure: ESOPHAGOGASTRODUODENOSCOPY (EGD) WITH PROPOFOL;  Surgeon: Sherrilyn Rist, MD;  Location: [facility] ENDOSCOPY;  Service: Gastroenterology;  Laterality: N/A;  . EYE SURGERY Bilateral     cataract surgery with lens implant  . FOOT SURGERY  1982   bone spur  . I & D EXTREMITY  09/06/2012   Budd Palmer, MD; Right;  I&D of right thigh  . I & D EXTREMITY Left 07/2013   wound vac - daily doxycycline indefinitely  . KNEE ARTHROSCOPY  09/01/2012   Dannielle Huh, MD;  Right  . LEFT HEART CATH AND CORONARY ANGIOGRAPHY N/A 11/10/2019   Procedure: LEFT HEART CATH AND CORONARY ANGIOGRAPHY;  Surgeon: Lennette Bihari, MD;  Location: [facility];  Service: Cardiovascular;  Laterality: N/A;  . LUMBAR FUSION  01/08/2012   L3-4  . LUMBAR FUSION  02/2019   unexpectedly discovered MRSA infection - pus Lovell Sheehan)   . LUMBAR LAMINECTOMY/DECOMPRESSION MICRODISCECTOMY N/A 11/13/2016   LAMINOTOMY/LAMINECTOMY LUMBAR FOUR LUMBAR FIVE  WITH RESECTION OF SYNOVIAL CYST;  Surgeon: Tressie Stalker, MD  . NECK SURGERY     Herniated disk C2,3,4  . NOSE SURGERY    . PARTIAL HYSTERECTOMY  1984   for mennorhagia, ovaries remain  . REVISION TOTAL HIP ARTHROPLASTY Left 08/28/2011  . TMJ ARTHROPLASTY  1982  . TOTAL HIP ARTHROPLASTY Left 2002  . TRIGGER FINGER RELEASE Right 05/15/2011   long finger    reports that she quit smoking about 10 years ago. Her smoking use included cigarettes. She has a 60.00 pack-year smoking history. She has never used smokeless tobacco. She reports current alcohol use of about 21.0 standard drinks of alcohol per week. She reports that she does not use drugs. family history includes Alcohol abuse in her father; Aneurysm (age of onset: 33) in her father; CAD in an other family member; Cancer in her brother and father; Diabetes in her brother and sister. Allergies  Allergen Reactions  . Cephalexin Hives  . Hydrocodone Other (See Comments)    Reaction:  Hallucinations   . Tolterodine Tartrate Nausea And Vomiting  . Risperidone And Related Other (See Comments)    Reaction:  Made pt excessively sleepy  . Seroquel [Quetiapine Fumarate] Other (See Comments)    Reaction:   Made pt excessively sleepy  . Sulfa Antibiotics Rash      Outpatient Encounter Medications as of 11/21/2019  Medication Sig  . albuterol (PROVENTIL HFA;VENTOLIN HFA) 108 (90 Base) MCG/ACT inhaler Inhale 2 puffs into the lungs every 6 (six) hours as needed for wheezing or shortness of breath.  Marland Kitchen aspirin 81 MG EC tablet Take 81 mg by mouth at bedtime.   . budesonide-formoterol (SYMBICORT) 160-4.5 MCG/ACT inhaler Inhale 2 puffs into the lungs 2 (two) times daily.  . carvedilol (COREG) 6.25 MG tablet Take 1 tablet (6.25 mg total) by mouth 2 (two) times daily.  Marland Kitchen  dicyclomine (BENTYL) 20 MG tablet Take 20 mg by mouth as needed for spasms.  Marland Kitchen docusate sodium (COLACE) 100 MG capsule Take 1 capsule (100 mg total) by mouth 2 (two) times daily.  Marland Kitchen FLUoxetine (PROZAC) 40 MG capsule Take by mouth daily.  . furosemide (LASIX) 20 MG tablet Take 1 tablet (20 mg total) by mouth daily.  Marland Kitchen lamoTRIgine (LAMICTAL) 200 MG tablet Take 200 mg by mouth daily.    Marland Kitchen levothyroxine (SYNTHROID) 150 MCG tablet Take 1 tablet (150 mcg total) by mouth at bedtime.  . montelukast (SINGULAIR) 10 MG tablet TAKE 1 TABLET BY MOUTH EVERYDAY AT BEDTIME (Patient taking differently: Take 10 mg by mouth at bedtime. )  . Multiple Vitamin (MULTIVITAMIN WITH MINERALS) TABS tablet Take 2 tablets by mouth daily.   . ondansetron (ZOFRAN ODT) 8 MG disintegrating tablet Take 1 tablet (8 mg total) by mouth 2 (two) times daily as needed for nausea or vomiting.  . Oxcarbazepine (TRILEPTAL) 300 MG tablet Take 300-600 mg by mouth See admin instructions. Take 300 mg by mouth in the morning and 600 mg at night  . oxybutynin (DITROPAN-XL) 10 MG 24 hr tablet Take 1 tablet (10 mg total) by mouth daily.  Marland Kitchen oxyCODONE 10 MG TABS Take 1 tablet (10 mg total) by mouth every 4 (four) hours as needed for severe pain ((score 7 to 10)).  Marland Kitchen pantoprazole (PROTONIX) 40 MG tablet Take 1 tablet (40 mg total) by mouth daily. (Patient taking differently: Take 40 mg by  mouth 2 (two) times daily. )  . polyethylene glycol (MIRALAX / GLYCOLAX) 17 g packet Take 17 g by mouth 2 (two) times daily as needed for mild constipation.  . sacubitril-valsartan (ENTRESTO) 24-26 MG Take 1 tablet by mouth 2 (two) times daily.  . simvastatin (ZOCOR) 40 MG tablet Take 40 mg by mouth every evening.  . sucralfate (CARAFATE) 1 GM/10ML suspension Take 10 mLs (1 g total) by mouth 4 (four) times daily -  with meals and at bedtime.  . zaleplon (SONATA) 10 MG capsule Take 10 mg by mouth at bedtime as needed for sleep.  . promethazine (PHENERGAN) 25 MG suppository Place 1 suppository (25 mg total) rectally every 6 (six) hours as needed for nausea or vomiting. (Patient not taking: Reported on 11/21/2019)  . [DISCONTINUED] Cholecalciferol (VITAMIN D3) 25 MCG (1000 UT) CAPS Take 1 capsule (1,000 Units total) by mouth daily. (Patient not taking: Reported on 11/04/2019)  . [DISCONTINUED] methocarbamol (ROBAXIN-750) 750 MG tablet Take 1 tablet (750 mg total) by mouth every 6 (six) hours as needed for muscle spasms.  . [DISCONTINUED] nystatin (MYCOSTATIN/NYSTOP) powder Apply topically 4 (four) times daily. (Patient taking differently: Apply 1 application topically 4 (four) times daily as needed (rash). )  . [DISCONTINUED] traMADol (ULTRAM) 50 MG tablet Take 50-100 mg by mouth every 6 (six) hours as needed for moderate pain.    No facility-administered encounter medications on file as of 11/21/2019.     REVIEW OF SYSTEMS  : All other systems reviewed and negative except where noted in the History of Present Illness.   PHYSICAL EXAM: BP 132/60   Pulse 70   Temp (!) 96.4 F (35.8 C)   Ht 5\' 4"  (1.626 m)   LMP  (LMP Unknown)   SpO2 98%   BMI 63.92 kg/m  General: Well developed Ore female in no acute distress Head: Normocephalic and atraumatic Eyes:  Sclerae anicteric, conjunctiva pink. Ears: Normal auditory acuity Lungs: Clear throughout to auscultation; no increased WOB.  Heart: Regular  rate and rhythm; no M/R/G. Abdomen: Soft, obese, non-distended.  BS present.  No significant TTP. Musculoskeletal: Symmetrical with no gross deformities  Skin: No lesions on visible extremities Extremities: No edema  Neurological: Alert oriented x 4, grossly non-focal Psychological:  Alert and cooperative. Normal mood and affect  ASSESSMENT AND PLAN: *69 year old female with reports of 3 weeks of nausea and vomiting.  Tolerating liquids but apparently not able to tolerate anything in the way of food.  Recent negative CT scan x2, EGD, and gastric emptying scan.  Reports similar symptoms in the past and it looks like she had colonoscopy in April 2017 for the same type of complaints.  Daughter is concerned she has some type of "blockage".  I advised that nothing of the sort was seen on recent CT scans.  I think that we will start by proceeding with HIDA scan with CCK to rule out biliary dysfunction.  If unremarkable question if we repeat colonoscopy although I think that would be a very low yield to show [place] any cause of her symptoms.  Will renew her Compazine prescription as she is alternating her antiemetics.  We will check a urinalysis at her daughter's request.  If abnormal will forward to PCP or infectious disease who she also follows with for ongoing antibiotic requirement.  Will renew Bentyl prescription and I would like her to begin taking it regularly, 20 mg twice daily, instead of just on an as-needed basis.  Prescriptions sent.   CC:  Ria Bush, MD

## 2019-11-22 ENCOUNTER — Other Ambulatory Visit: Payer: Self-pay | Admitting: Internal Medicine

## 2019-11-22 ENCOUNTER — Other Ambulatory Visit: Payer: Self-pay | Admitting: Pharmacy Technician

## 2019-11-22 DIAGNOSIS — B373 Candidiasis of vulva and vagina: Secondary | ICD-10-CM

## 2019-11-22 DIAGNOSIS — B3731 Acute candidiasis of vulva and vagina: Secondary | ICD-10-CM

## 2019-11-22 NOTE — Patient Outreach (Signed)
[facility] [place]) Care Management  11/22/2019  Amy Barnes 1951/03/29 [id number]                                        Medication Assistance Referral  Referral From: [facility] RPh Katina B.  Medication/Company: Sherryll Burger / [employer] Patient application portion:  Mining engineer portion: Faxed  to Dr. Rollene Rotunda Provider address/fax verified via: Office website  Medication/Company: Symbicort / [place] Patient application portion:  Mailed Provider application portion: Faxed  to Dr. [name] Provider address/fax verified via: Office website  Medication/Company: Proventil HFA / [employer] Patient application portion:  Mailed Provider application portion: Interoffice Mailed to Dr. [name] Provider address/fax verified via: Office website    Follow up:  Will follow up with patient in 5-15 business days to confirm application(s) have been received.  Amy Barnes P. [name], CPhT Musician Care Management [phone]

## 2019-11-23 ENCOUNTER — Other Ambulatory Visit: Payer: Self-pay

## 2019-11-23 ENCOUNTER — Telehealth: Payer: Self-pay

## 2019-11-23 ENCOUNTER — Ambulatory Visit (INDEPENDENT_AMBULATORY_CARE_PROVIDER_SITE_OTHER): Payer: Medicare Other | Admitting: Internal Medicine

## 2019-11-23 DIAGNOSIS — R1013 Epigastric pain: Secondary | ICD-10-CM | POA: Diagnosis not present

## 2019-11-23 DIAGNOSIS — N3000 Acute cystitis without hematuria: Secondary | ICD-10-CM

## 2019-11-23 DIAGNOSIS — M4626 Osteomyelitis of vertebra, lumbar region: Secondary | ICD-10-CM

## 2019-11-23 DIAGNOSIS — B372 Candidiasis of skin and nail: Secondary | ICD-10-CM

## 2019-11-23 DIAGNOSIS — A4902 Methicillin resistant Staphylococcus aureus infection, unspecified site: Secondary | ICD-10-CM

## 2019-11-23 NOTE — Progress Notes (Signed)
Virtual Visit via Telephone Note  I connected with Amy Barnes on 11/23/19 at  2:00 PM EST by telephone and verified that I am speaking with the correct person using two identifiers.  Location: Patient: at home Provider: at clinic   I discussed the limitations, risks, security and privacy concerns of performing an evaluation and management service by telephone and the availability of in person appointments. I also discussed with the patient that there may be a patient responsible charge related to this service. The patient expressed understanding and agreed to proceed.  History of Present Illness: Having ongoing epigastric pain. Seen by GI, doing gallbladder function evaluation. She had stopped doxycycline and noticed no difference with her symptoms. She has been having dysuria. Urine cx still pending - her ua showing pyuria/bacturia from Monday.   ROS: she also reports having new candidal rash beneath breasts bilaterally not responding to antifungal cream   Observations/Objective: Did not see patient since phone interview  Assessment and Plan: Candidal skin infection = will treat with fluconazole 200mg  daily x 10d  uti = will do a dose fosfomycin  MRSA discitis with HW infection = start doxycycline 100mg  daily x 7 days then increase to BID to see if she tolerates antibiotics for chronic suppression. Will send in refills  We will see back in clinic in 3-4 wk to check inflammatory markers  Follow Up Instructions:    I discussed the assessment and treatment plan with the patient. The patient was provided an opportunity to ask questions and all were answered. The patient agreed with the plan and demonstrated an understanding of the instructions.   The patient was advised to call back or seek an in-person evaluation if the symptoms worsen or if the condition fails to improve as anticipated.  I provided 20 minutes of non-face-to-face time during this encounter.   ,  MD

## 2019-11-23 NOTE — Progress Notes (Signed)
____________________________________________________________  Attending physician addendum:  Thank you for sending this case to me. I have reviewed the entire note, and the outlined plan seems appropriate.  Normal upper endoscopy, normal gastric emptying study, unrevealing CT abdomen and pelvis (no obstruction). This does not seem typical for biliary dyskinesia in my opinion.  Overall, I think this is side effect of 1 or more medicines, considering her polypharmacy.  Certainly should stop the oxybutynin if they feel the timing is related to that, and it is a nonessential medication.  This is in the setting of chronic use of oxycodone, tramadol, Robaxin and other medicines.  There is no indication this is a lower digestive problem, I would not perform a colonoscopy for the symptoms.  Symptomatic treatment, minimizing prescription medicines if possible due to current polypharmacy.  Amada Jupiter, MD  ____________________________________________________________

## 2019-11-23 NOTE — Telephone Encounter (Signed)
Received faxed pt assistance app from [facility], CPhT with [facility] for Symbicort.  Placed form in Dr. Timoteo Expose box.

## 2019-11-24 NOTE — Telephone Encounter (Signed)
Faxed form.

## 2019-11-24 NOTE — Telephone Encounter (Addendum)
Filled and in Lisa's box 

## 2019-11-25 ENCOUNTER — Other Ambulatory Visit: Payer: Self-pay

## 2019-11-25 ENCOUNTER — Telehealth: Payer: Self-pay

## 2019-11-25 ENCOUNTER — Encounter: Payer: Self-pay | Admitting: Family Medicine

## 2019-11-25 ENCOUNTER — Ambulatory Visit (INDEPENDENT_AMBULATORY_CARE_PROVIDER_SITE_OTHER): Payer: Medicare Other | Admitting: Family Medicine

## 2019-11-25 VITALS — BP 130/68 | HR 70 | Temp 97.5°F | Ht 64.0 in | Wt 371.1 lb

## 2019-11-25 DIAGNOSIS — I5020 Unspecified systolic (congestive) heart failure: Secondary | ICD-10-CM | POA: Insufficient documentation

## 2019-11-25 DIAGNOSIS — I214 Non-ST elevation (NSTEMI) myocardial infarction: Secondary | ICD-10-CM

## 2019-11-25 DIAGNOSIS — N390 Urinary tract infection, site not specified: Secondary | ICD-10-CM | POA: Insufficient documentation

## 2019-11-25 DIAGNOSIS — B3731 Acute candidiasis of vulva and vagina: Secondary | ICD-10-CM

## 2019-11-25 DIAGNOSIS — R112 Nausea with vomiting, unspecified: Secondary | ICD-10-CM

## 2019-11-25 DIAGNOSIS — B373 Candidiasis of vulva and vagina: Secondary | ICD-10-CM

## 2019-11-25 DIAGNOSIS — I5021 Acute systolic (congestive) heart failure: Secondary | ICD-10-CM | POA: Insufficient documentation

## 2019-11-25 DIAGNOSIS — N3000 Acute cystitis without hematuria: Secondary | ICD-10-CM

## 2019-11-25 DIAGNOSIS — K219 Gastro-esophageal reflux disease without esophagitis: Secondary | ICD-10-CM | POA: Diagnosis not present

## 2019-11-25 DIAGNOSIS — E1142 Type 2 diabetes mellitus with diabetic polyneuropathy: Secondary | ICD-10-CM | POA: Diagnosis not present

## 2019-11-25 DIAGNOSIS — D649 Anemia, unspecified: Secondary | ICD-10-CM | POA: Diagnosis not present

## 2019-11-25 DIAGNOSIS — R32 Unspecified urinary incontinence: Secondary | ICD-10-CM

## 2019-11-25 DIAGNOSIS — I10 Essential (primary) hypertension: Secondary | ICD-10-CM

## 2019-11-25 LAB — CBC WITH DIFFERENTIAL/PLATELET
Basophils Absolute: 0.1 10*3/uL (ref 0.0–0.1)
Basophils Relative: 1.1 % (ref 0.0–3.0)
Eosinophils Absolute: 0.2 10*3/uL (ref 0.0–0.7)
Eosinophils Relative: 4.4 % (ref 0.0–5.0)
HCT: 35.5 % — ABNORMAL LOW (ref 36.0–46.0)
Hemoglobin: 11.6 g/dL — ABNORMAL LOW (ref 12.0–15.0)
Lymphocytes Relative: 32.4 % (ref 12.0–46.0)
Lymphs Abs: 1.8 10*3/uL (ref 0.7–4.0)
MCHC: 32.7 g/dL (ref 30.0–36.0)
MCV: 86.4 fl (ref 78.0–100.0)
Monocytes Absolute: 0.6 10*3/uL (ref 0.1–1.0)
Monocytes Relative: 10.7 % (ref 3.0–12.0)
Neutro Abs: 2.9 10*3/uL (ref 1.4–7.7)
Neutrophils Relative %: 51.4 % (ref 43.0–77.0)
Platelets: 333 10*3/uL (ref 150.0–400.0)
RBC: 4.11 Mil/uL (ref 3.87–5.11)
RDW: 16.6 % — ABNORMAL HIGH (ref 11.5–15.5)
WBC: 5.6 10*3/uL (ref 4.0–10.5)

## 2019-11-25 LAB — COMPREHENSIVE METABOLIC PANEL
ALT: 19 U/L (ref 0–35)
AST: 17 U/L (ref 0–37)
Albumin: 3.8 g/dL (ref 3.5–5.2)
Alkaline Phosphatase: 65 U/L (ref 39–117)
BUN: 10 mg/dL (ref 6–23)
CO2: 30 mEq/L (ref 19–32)
Calcium: 9.4 mg/dL (ref 8.4–10.5)
Chloride: 101 mEq/L (ref 96–112)
Creatinine, Ser: 0.7 mg/dL (ref 0.40–1.20)
GFR: 83.35 mL/min (ref >60.00)
Glucose, Bld: 125 mg/dL — ABNORMAL HIGH (ref 70–99)
Potassium: 4.7 mEq/L (ref 3.5–5.1)
Sodium: 136 mEq/L (ref 135–145)
Total Bilirubin: 0.4 mg/dL (ref 0.2–1.2)
Total Protein: 6.7 g/dL (ref 6.0–8.3)

## 2019-11-25 MED ORDER — DOXYCYCLINE HYCLATE 100 MG PO TABS: 100.0000 mg | ORAL_TABLET | Freq: Two times a day (BID) | ORAL | 3 refills | Status: AC

## 2019-11-25 MED ORDER — MONTELUKAST SODIUM 10 MG PO TABS: 10.0000 mg | ORAL_TABLET | Freq: Every day | ORAL | 3 refills | Status: DC

## 2019-11-25 MED ORDER — FOSFOMYCIN TROMETHAMINE 3 G PO PACK: 3.0000 g | PACK | Freq: Once | ORAL | 0 refills | Status: AC

## 2019-11-25 MED ORDER — PANTOPRAZOLE SODIUM 40 MG PO TBEC: 40.0000 mg | DELAYED_RELEASE_TABLET | Freq: Two times a day (BID) | ORAL | 1 refills | Status: DC

## 2019-11-25 MED ORDER — FLUCONAZOLE 200 MG PO TABS: 200.0000 mg | ORAL_TABLET | Freq: Every day | ORAL | 1 refills | Status: DC

## 2019-11-25 MED ORDER — FOSFOMYCIN TROMETHAMINE 3 G PO PACK: 3.0000 g | PACK | Freq: Once | ORAL | Status: DC

## 2019-11-25 NOTE — Assessment & Plan Note (Signed)
Stable period on carvedilol, losartan, lasix.

## 2019-11-25 NOTE — Assessment & Plan Note (Signed)
Update labs.  

## 2019-11-25 NOTE — Assessment & Plan Note (Signed)
Continue BID PPI for now, discussed trying to taper off carafate.

## 2019-11-25 NOTE — Assessment & Plan Note (Addendum)
Overall stable period off antihyperglycemic.

## 2019-11-25 NOTE — Assessment & Plan Note (Signed)
This has improved. Largely unrevealing GI workup (EGD, stomach emptying study, CT scan). Pending HIDA scan. Continue PPI BID, start taper off carafate.

## 2019-11-25 NOTE — Patient Instructions (Addendum)
Labs today. Continue current medicines.  Try to back off carafate for now.  Will see cardiology evaluation.

## 2019-11-25 NOTE — Telephone Encounter (Signed)
Per Dr. Drue Second following prescriptions sent to patients preferred pharmacy.   "Can you send in fosfomycin 3gm x 1. Fluconazole 200mg  daily x 10d with 1 refill, doxycycline 100mg  bid #60, start with 100mg  daily x 7 day. With 3 refills"  , RN

## 2019-11-25 NOTE — Assessment & Plan Note (Signed)
New during recent hospitalization, thought due to acute illness stressors. Appreciate cards care - planned f/u next month.

## 2019-11-25 NOTE — Assessment & Plan Note (Addendum)
Recently saw ID, planned treatment for abnormal UA during hospitalization with fosfomycin. UCx was not sent.

## 2019-11-25 NOTE — Assessment & Plan Note (Signed)
New during recent hospitalization, EF on echo down to 35-40%, thought due to acute illness stressors. Appreciate cards care - planned f/u next month.  Unable to afford entresto but does have 30d supply at home - rec losartan once they run out of entresto and f/u with cards.

## 2019-11-25 NOTE — Telephone Encounter (Signed)
Received follow up message from patient's daughter requesting follow up from Dr. Drue Second.   "My understanding from the phone visit with Dr. Drue Second she was to take 10 days of Fluconazole, take (1) Docycycline a day for 7 days and then go back to 2/day until we see her again the end of March, and she was calling in a new one dose Rx antibiotic .   The 2 Rx we just received was Fluconazole for 10 days and doxycycline for 7 days, and no new antibiotic for UTI.  Can you please follow up with her regarding the new antibiotic and a month Rx for 2 doxy/day?  Please call with questions."  Fosfomycin rx requires a prior authorization that is being completed. Please advise for additional questions.   Marvis Bakken Loyola Mast, RN

## 2019-11-25 NOTE — Assessment & Plan Note (Addendum)
Has not used oxybutynin recently. rec sparing use.  Amy Barnes thought to have caused acute issue with nausea/vomiting.

## 2019-11-28 ENCOUNTER — Other Ambulatory Visit: Payer: Self-pay | Admitting: Family Medicine

## 2019-11-28 NOTE — Telephone Encounter (Signed)
She was to get 1 dose of fosfomycin. Did that go through?

## 2019-11-28 NOTE — Telephone Encounter (Signed)
It did. I had to do a prior authorization which was approved. She should have all of the medication. She was curious about the dosing/frequency of the doxycycline as well.  Thanks!

## 2019-11-29 ENCOUNTER — Other Ambulatory Visit: Payer: Self-pay | Admitting: *Deleted

## 2019-11-29 NOTE — Patient Outreach (Signed)
[facility]) Care Management  11/29/2019  EVETT KASSA 1951/02/01 [id number]   Call placed to member to follow up on recovery from discharge and to complete initial assessment.  She report feeling a little better, have been able to eat solid foods over the past week without vomiting.  State the Bentyl that was prescribed has made a big difference.  However she does express concern regarding her Celebrex being held, state she is unable to [facility] as she usually could, report knees are locking up more often and more painful.  She verbalizes understanding regarding it being on hold as this may be contributing to her abdominal problems, but will discuss with MD plan to restart.  She has seen PCP, GI, and ID over the past 2 weeks, will have HIDA scan and visit with cardiology next week.  She confirms that she has received EMMI education regarding heart failure management, has not received the scale yet.  Will look into timeline for receiving.    Denies any urgent concerns at this time, will follow up within the next month.  [facility] CM Care Plan Problem One     Most Recent Value  Care Plan Problem One  Risk for readmission related to new diagnosis of heart failure  Role Documenting the Problem One  Care Management [place] for Problem One  Active  [facility] Long Term Goal   Member will not be readmitted to hospital within the next 31 days  [facility] Long Term Goal Start Date  11/15/19  Interventions for Problem One Long Term Goal  Plan of care reviewed with member.  Upcoming appointments for HIDA scan and cardiology follow up reviewed with member, assessed need for transportation assistance  [facility] CM Short Term Goal #1   Member will report checking weights daily over the next 4 weeks  [facility] CM Short Term Goal #1 Start Date  11/15/19  Interventions for Short Term Goal #1  Discussed importance of weighing and recording weights daily in effort to effectively manage heart condition  [facility] CM  Short Term Goal #2   Member will report taking medications as instructed over the next 3 weeks  [facility] CM Short Term Goal #2 Start Date  11/15/19  Interventions for Short Term Goal #2  Medication changes reviewed with member.  Advised to contact MD to obtain clear plan for restarting Celebrex  [facility] CM Short Term Goal #3  Member will report attending follow up appointments with GI and PCP over the next week  [facility] CM Short Term Goal #3 Start Date  11/15/19  [facility] CM Short Term Goal #3 Met Date  11/29/19     Valente David, RN, MSN Burna Manager [phone]

## 2019-12-05 ENCOUNTER — Telehealth (INDEPENDENT_AMBULATORY_CARE_PROVIDER_SITE_OTHER): Payer: Medicare Other | Admitting: Cardiology

## 2019-12-05 ENCOUNTER — Ambulatory Visit: Payer: Medicare Other | Admitting: Adult Health

## 2019-12-05 ENCOUNTER — Ambulatory Visit (HOSPITAL_COMMUNITY)
Admission: RE | Admit: 2019-12-05 | Discharge: 2019-12-05 | Disposition: A | Discharge status: Home / Self Care | Payer: Medicare Other | Source: Ambulatory Visit | Attending: Gastroenterology | Admitting: Gastroenterology

## 2019-12-05 ENCOUNTER — Encounter: Payer: Self-pay | Admitting: Cardiology

## 2019-12-05 ENCOUNTER — Other Ambulatory Visit: Payer: Self-pay

## 2019-12-05 VITALS — BP 161/86 | HR 77 | Ht 64.0 in | Wt 374.0 lb

## 2019-12-05 DIAGNOSIS — G4730 Sleep apnea, unspecified: Secondary | ICD-10-CM

## 2019-12-05 DIAGNOSIS — E039 Hypothyroidism, unspecified: Secondary | ICD-10-CM

## 2019-12-05 DIAGNOSIS — I110 Hypertensive heart disease with heart failure: Secondary | ICD-10-CM | POA: Diagnosis not present

## 2019-12-05 DIAGNOSIS — Z7982 Long term (current) use of aspirin: Secondary | ICD-10-CM

## 2019-12-05 DIAGNOSIS — E669 Obesity, unspecified: Secondary | ICD-10-CM | POA: Diagnosis not present

## 2019-12-05 DIAGNOSIS — I428 Other cardiomyopathies: Secondary | ICD-10-CM | POA: Diagnosis not present

## 2019-12-05 DIAGNOSIS — J449 Chronic obstructive pulmonary disease, unspecified: Secondary | ICD-10-CM

## 2019-12-05 DIAGNOSIS — Z87891 Personal history of nicotine dependence: Secondary | ICD-10-CM

## 2019-12-05 DIAGNOSIS — F319 Bipolar disorder, unspecified: Secondary | ICD-10-CM | POA: Diagnosis not present

## 2019-12-05 DIAGNOSIS — R112 Nausea with vomiting, unspecified: Secondary | ICD-10-CM | POA: Diagnosis not present

## 2019-12-05 DIAGNOSIS — Z6841 Body Mass Index (BMI) 40.0 and over, adult: Secondary | ICD-10-CM | POA: Diagnosis not present

## 2019-12-05 DIAGNOSIS — R0683 Snoring: Secondary | ICD-10-CM

## 2019-12-05 DIAGNOSIS — G4733 Obstructive sleep apnea (adult) (pediatric): Secondary | ICD-10-CM

## 2019-12-05 DIAGNOSIS — R109 Unspecified abdominal pain: Secondary | ICD-10-CM | POA: Diagnosis not present

## 2019-12-05 DIAGNOSIS — I429 Cardiomyopathy, unspecified: Secondary | ICD-10-CM

## 2019-12-05 DIAGNOSIS — I509 Heart failure, unspecified: Secondary | ICD-10-CM

## 2019-12-05 MED ORDER — ENTRESTO 49-51 MG PO TABS: 1.0000 | ORAL_TABLET | Freq: Two times a day (BID) | ORAL | 6 refills | Status: DC

## 2019-12-05 MED ORDER — TECHNETIUM TC 99M MEBROFENIN IV KIT
5.4600 | PACK | Freq: Once | INTRAVENOUS | Status: AC | PRN
  Administered 2019-12-05: 5.46 via INTRAVENOUS

## 2019-12-05 NOTE — Patient Instructions (Addendum)
Medication Instructions:  CONTINUE ENTRESTO. WE WILL CALL YOU WHEN WE GET SAMPLES. CALL us WHEN YOU ONLY HAVE A FEW DAYS LEFT. If you need a refill on your cardiac medications before your next appointment, please call your pharmacy.  Testing/Procedures: Your physician has recommended that you have a sleep study. This test records several body functions during sleep, including: brain activity, eye movement, oxygen and carbon dioxide blood levels, heart rate and rhythm, breathing rate and rhythm, the flow of air through your mouth and nose, snoring, body muscle movements, and chest and belly movement.  SOMEONE WILL BE CALLING YOU TO SCHEDULE THIS APPT  Follow-Up: 12-29-2019 @ 1145AM   Virtual Visit  Corine Shelter PA-C.    At [facility], you and your health needs are our priority.  As part of our continuing mission to provide you with exceptional heart care, we have created designated Provider Care Teams.  These Care Teams include your primary Cardiologist (physician) and Advanced Practice Providers (APPs -  Physician Assistants and Nurse Practitioners) who all work together to provide you with the care you need, when you need it.  Reduce your risk of getting COVID-19 With your heart disease it is especially important for people at increased risk of severe illness from COVID-19, and those who live with them, to protect themselves from getting COVID-19. The best way to protect yourself and to help reduce the spread of the virus that causes COVID-19 is to: Marland Kitchen Limit your interactions with other people as much as possible. . Take COVID-19 when you do interact with others. If you start feeling sick and think you may have COVID-19, get in touch with your healthcare provider within 24 hours.  Thank you for choosing [facility] at [facility]!!

## 2019-12-05 NOTE — Progress Notes (Signed)
Virtual Visit via Video Note   This visit type was conducted due to national recommendations for restrictions regarding the COVID-19 Pandemic (e.g. social distancing) in an effort to limit this patient's exposure and mitigate transmission in our community.  Due to her co-morbid illnesses, this patient is at least at moderate risk for complications without adequate follow up.  This format is felt to be most appropriate for this patient at this time.  All issues noted in this document were discussed and addressed.  A limited physical exam was performed with this format.  Please refer to the patient's chart for her consent to telehealth for [facility].   The patient was identified using 2 identifiers.  Date:  12/05/2019   ID:  Amy Barnes, DOB [birth year], MRN [id number]  Patient Location: Home Provider Location: Home  PCP:  [name], MD  Cardiologist:  Rollene Rotunda, MD  Electrophysiologist:  None   Evaluation Performed:  Follow-Up Visit  Chief Complaint:  none  History of Present Illness:    Amy Barnes is a 69 y.o. female with a history of obesity, hypertension, COPD, remote sleep apnea, and the left bundle branch block.  She was admitted to the emergency room in February 2021 with nausea and vomiting.  Her troponin was elevated.  Her D-dimer was elevated.  She had a CT scan that was negative for pulmonary embolism but did show coronary calcification and congestive heart failure.  Cardiology saw her in consult.  She apparently has been having problems with nausea and vomiting for the past month or so.  She was admitted at one point with N+V and a bump in her SCr- treated with hydration.  EGD was unremarkable.  She had a hepatobiliary scan today that was WNL.   Work up during her recent hospitalization in showed a new CM with an EF of 35-40% and apical WMA  By echo on 11/09/2019.  Previous echo 4 days earlier 11/05/2019 showed an EF of 60-65%.  Diagnostic cath done 11/10/2019  showed normal coronaries.  She has been treated as NICM with Entresto, lasix, and Coreg.  She was contacted today for a video follow up.  Her daughter was present.  The patient denies any unusual SOB.  They have not yet applied for patient assistance for Entresto.    The patient does not have symptoms concerning for COVID-19 infection (fever, chills, cough, or new shortness of breath).    Past Medical History:  Diagnosis Date  . Allergic rhinitis   . Ambulates with cane    straight  . Anemia   . Anxiety   . Asthma    seasonal  . Bipolar affective disorder (HCC)    takes Synthroid meds for Bipolar  . CAD (coronary artery disease) 07/2016   by CT scan  . Carpal tunnel syndrome    had surgery but occasional still has some issues per patient  . Cataract   . Centrilobular emphysema (HCC) 07/2016   by CT scan - pt not aware of this  . Constipation due to pain medication   . Depression with anxiety   . Diabetes mellitus    type 2 - no meds diet controlled  . GERD (gastroesophageal reflux disease)   . History of blood transfusion   . 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 Hyperlipidemia   . Hypertension   . OSA (obstructive sleep apnea)    no longer using cpap, uses a  bed that raises and lowers hob  . Osteoarthritis   . Osteopenia 08/11/2019   DEXA 07/2019 - T -1.1 R femur (osteopenia)  . Pneumonia   . Restless legs   . Septic arthritis ([place]) 10/11/2012  . Shingles 06/30/2016  . Status post revision of total hip replacement bilateral   prosthetic infection R 2013, L 2015  . Thoracic aortic atherosclerosis ([place]) 11/207   by CT   Past Surgical History:  Procedure Laterality Date  . BREAST CYST ASPIRATION    . CARPAL TUNNEL RELEASE Right 05/15/2011  . CARPAL TUNNEL RELEASE Left 12/24/2017   Procedure: LEFT CARPAL TUNNEL RELEASE;  Surgeon: Leanora Cover, MD;  Location: [facility];  Service: Orthopedics;  Laterality: Left;  .  CERVICAL FUSION  2012   C2/3/4  . COLONOSCOPY WITH PROPOFOL N/A 12/31/2015   diverticulosis, int hem, o/w normal rpt 10 yrs ([place])  . ESOPHAGOGASTRODUODENOSCOPY (EGD) WITH PROPOFOL N/A 11/12/2019   Procedure: ESOPHAGOGASTRODUODENOSCOPY (EGD) WITH PROPOFOL;  Surgeon: Doran Stabler, MD;  Location: [facility];  Service: Gastroenterology;  Laterality: N/A;  . EYE SURGERY Bilateral    cataract surgery with lens implant  . FOOT SURGERY  1982   bone spur  . I & D EXTREMITY  09/06/2012   [name], MD; Right;  I&D of right thigh  . I & D EXTREMITY Left 07/2013   wound vac - daily doxycycline indefinitely  . KNEE ARTHROSCOPY  09/01/2012   [name], MD;  Right  . LEFT HEART CATH AND CORONARY ANGIOGRAPHY N/A 11/10/2019   Procedure: LEFT HEART CATH AND CORONARY ANGIOGRAPHY;  Surgeon: Troy Sine, MD;  Location: [facility];  Service: Cardiovascular;  Laterality: N/A;  . LUMBAR FUSION  01/08/2012   L3-4  . LUMBAR FUSION  02/2019   unexpectedly discovered MRSA infection - pus [name])   . LUMBAR LAMINECTOMY/DECOMPRESSION MICRODISCECTOMY N/A 11/13/2016   LAMINOTOMY/LAMINECTOMY LUMBAR FOUR LUMBAR FIVE  WITH RESECTION OF SYNOVIAL CYST;  Surgeon: Newman Pies, MD  . NECK SURGERY     Herniated disk C2,3,4  . NOSE SURGERY    . PARTIAL HYSTERECTOMY  1984   for mennorhagia, ovaries remain  . REVISION TOTAL HIP ARTHROPLASTY Left 08/28/2011  . TMJ ARTHROPLASTY  1982  . TOTAL HIP ARTHROPLASTY Left 2002  . TRIGGER FINGER RELEASE Right 05/15/2011   long finger     Current Meds  Medication Sig  . albuterol (PROVENTIL HFA;VENTOLIN HFA) 108 (90 Base) MCG/ACT inhaler Inhale 2 puffs into the lungs every 6 (six) hours as needed for wheezing or shortness of breath.  Marland Kitchen aspirin 81 MG EC tablet Take 81 mg by mouth at bedtime.   . budesonide-formoterol (SYMBICORT) 160-4.5 MCG/ACT inhaler Inhale 2 puffs into the lungs 2 (two) times daily.  . carvedilol (COREG) 6.25 MG tablet Take 1 tablet  (6.25 mg total) by mouth 2 (two) times daily.  Marland Kitchen dicyclomine (BENTYL) 20 MG tablet Take 1 tablet (20 mg total) by mouth in the morning and at bedtime.  . docusate sodium (COLACE) 100 MG capsule Take 1 capsule (100 mg total) by mouth 2 (two) times daily.  . fluconazole (DIFLUCAN) 200 MG tablet Take 1 tablet (200 mg total) by mouth daily for 10 days.  Marland Kitchen FLUoxetine (PROZAC) 40 MG capsule Take by mouth daily.  . furosemide (LASIX) 20 MG tablet Take 1 tablet (20 mg total) by mouth daily.  Marland Kitchen lamoTRIgine (LAMICTAL) 200 MG tablet Take 200 mg by mouth daily.    Marland Kitchen  levothyroxine (SYNTHROID) 150 MCG tablet Take 1 tablet (150 mcg total) by mouth at bedtime.  . montelukast (SINGULAIR) 10 MG tablet Take 1 tablet (10 mg total) by mouth at bedtime.  . Multiple Vitamin (MULTIVITAMIN WITH MINERALS) TABS tablet Take 2 tablets by mouth daily.   . ondansetron (ZOFRAN ODT) 8 MG disintegrating tablet Take 1 tablet (8 mg total) by mouth 2 (two) times daily as needed for nausea or vomiting.  . Oxcarbazepine (TRILEPTAL) 300 MG tablet Take 300-600 mg by mouth See admin instructions. Take 300 mg by mouth in the morning and 600 mg at night  . oxybutynin (DITROPAN-XL) 10 MG 24 hr tablet Take 1 tablet (10 mg total) by mouth daily.  Marland Kitchen oxyCODONE 10 MG TABS Take 1 tablet (10 mg total) by mouth every 4 (four) hours as needed for severe pain ((score 7 to 10)).  Marland Kitchen pantoprazole (PROTONIX) 40 MG tablet Take 1 tablet (40 mg total) by mouth 2 (two) times daily.  . polyethylene glycol (MIRALAX / GLYCOLAX) 17 g packet Take 17 g by mouth 2 (two) times daily as needed for mild constipation.  . prochlorperazine (COMPAZINE) 10 MG tablet Take 1 tablet (10 mg total) by mouth every 6 (six) hours as needed for nausea or vomiting.  . promethazine (PHENERGAN) 25 MG suppository Place 1 suppository (25 mg total) rectally every 6 (six) hours as needed for nausea or vomiting.  . sacubitril-valsartan (ENTRESTO) 24-26 MG Take 1 tablet by mouth 2 (two) times  daily.  . simvastatin (ZOCOR) 40 MG tablet Take 40 mg by mouth every evening.  . sucralfate (CARAFATE) 1 GM/10ML suspension Take 10 mLs (1 g total) by mouth 4 (four) times daily -  with meals and at bedtime.  . zaleplon (SONATA) 10 MG capsule Take 10 mg by mouth at bedtime as needed for sleep.     Allergies:   Cephalexin, Hydrocodone, Tolterodine tartrate, Risperidone and related, Seroquel [quetiapine fumarate], and Sulfa antibiotics   Social History   Tobacco Use  . Smoking status: Former Smoker    Packs/day: 1.50    Years: 40.00    Pack years: 60.00    Types: Cigarettes    Quit date: 04/29/2009    Years since quitting: 10.6  . Smokeless tobacco: Never Used  Substance Use Topics  . Alcohol use: Yes    Alcohol/week: 21.0 standard drinks    Types: 21 Shots of liquor per week    Comment: 3 shots/per night  . Drug use: No     Family Hx: The patient's family history includes Alcohol abuse in her father; Aneurysm (age of onset: 62) in her father; CAD in an other family member; Cancer in her brother and father; Diabetes in her brother and sister. There is no history of Anesthesia problems, Hypotension, Malignant hyperthermia, Pseudochol deficiency, or Breast cancer.  ROS:   Please see the history of present illness.    All other systems reviewed and are negative.   Prior CV studies:   The following studies were reviewed today:  Echo 11/09/2019 Cath 11/10/2019  Labs/Other Tests and Data Reviewed:    EKG:  An ECG dated 11/11/2019 was personally reviewed today and demonstrated:  NSR, LAD, LVH with QRS widening  Recent Labs: 11/09/2019: TSH 1.050 11/11/2019: B Natriuretic Peptide 143.6 11/14/2019: Magnesium 2.0 11/25/2019: ALT 19; BUN 10; Creatinine, Ser 0.70; Hemoglobin 11.6; Platelets 333.0; Potassium 4.7; Sodium 136   Recent Lipid Panel Lab Results  Component Value Date/Time   CHOL 139 11/09/2019 05:04 AM  CHOL 133 06/18/2015 12:00 AM   TRIG 41 11/09/2019 05:04 AM   TRIG 62  06/18/2015 12:00 AM   HDL 58 11/09/2019 05:04 AM   CHOLHDL 2.4 11/09/2019 05:04 AM   LDLCALC 73 11/09/2019 05:04 AM   LDLCALC 57 06/18/2015 12:00 AM   LDLDIRECT 48.0 03/13/2016 10:25 AM    Wt Readings from Last 3 Encounters:  12/05/19 (!) 374 lb (169.6 kg)  11/25/19 (!) 371 lb 1 oz (168.3 kg)  11/21/19 (!) 363 lb (164.7 kg)     Objective:    Vital Signs:  BP (!) 161/86   Pulse 77   Ht 5\' 4"  (1.626 m)   Wt (!) 374 lb (169.6 kg)   LMP  (LMP Unknown)   SpO2 94%   BMI 64.20 kg/m    VITAL SIGNS:  reviewed  ASSESSMENT & PLAN:    NICM- Troponin does not seem high enough for a Takostubo event- ? Viral CM.  CHF- No real weight loss documented  HTN- Not yet at goal- 130/80 or less  Obesity- BMI 64  H/O asthmatic COPD- On inhalers  N+V- Unclear etiology- EGD and hepatobiliary scan unremarkable  Hypothyroid- TSH WNL  Bipolar- On Lamictal and Trileptal  Sleep apnea- Previous diagnosis- not on C-pap  Plan: I would like to increase her Entresto dose to 4951 but I am not sure if they will qualify for patient assistance.  Does not sound like they will be able to afford that medication if not.  I had her stay on the same dose until this is clarified.  If it turns out they cannot afford the [facility] then will put her on losartan.  I did order a sleep study.  She has previously documented sleep apnea with super morbid obesity and a cardiomyopathy.  I will see her back in the office in 3 to 4 weeks to uptitrate her medicines. Check BMP then.  We will plan on a follow-up echocardiogram in 3 months.  COVID-19 Education: The signs and symptoms of COVID-19 were discussed with the patient and how to seek care for testing (follow up with PCP or arrange E-visit).  The importance of social distancing was discussed today.  Time:   Today, I have spent 20 minutes with the patient with telehealth technology discussing the above problems.     Medication Adjustments/Labs and Tests  Ordered: Current medicines are reviewed at length with the patient today.  Concerns regarding medicines are outlined above.   Tests Ordered: No orders of the defined types were placed in this encounter.   Medication Changes: No orders of the defined types were placed in this encounter.   Follow Up:  In Person 3-4 weeks with me  Signed, [facility], PA-C  12/05/2019 3:59 PM    [facility]

## 2019-12-06 ENCOUNTER — Other Ambulatory Visit: Payer: Self-pay | Admitting: Pharmacy Technician

## 2019-12-06 ENCOUNTER — Telehealth: Payer: Self-pay | Admitting: *Deleted

## 2019-12-06 NOTE — Telephone Encounter (Signed)
Left message to return a call to discuss Sleep study appointment details.

## 2019-12-06 NOTE — Patient Outreach (Signed)
[facility]) Care Management  12/06/2019  Amy Barnes October 10, 1950 [id number]    Successful call placed to patient regarding patient assistance application(s) for Entresto with [employer], Symbicort with [employer], Proventil HFA with [employer] , HIPAA identifiers verified.   Patient informed she received the applications. She inquired if she could submit 2019 taxes as she has not completed the 2020 taxes. Informed patient that most companies like the most up to date information. Informed her that we could try with the 2019 taxes but the companies may deny and request more up to date documents. Patient informed she would like to try with the 2019 taxes. She informed she would place the applications and supporting documentation in the mail this week.  Follow up:  Will route note to [facility] RPh Nunzio Cobbs for case closure if document(s) have not been received in the next 15 business days.  Amy Barnes P. [name], CPhT [facility]  [phone]

## 2019-12-12 ENCOUNTER — Telehealth: Payer: Self-pay | Admitting: Cardiology

## 2019-12-12 NOTE — Telephone Encounter (Signed)
Returned call to patient advised office is out of Entresto samples.Stated she is filling out her portion of patient assistance for Entresto.She will bring back to office for Dr.Hochrein to complete his portion along with her income to be faxed all together.

## 2019-12-12 NOTE — Telephone Encounter (Signed)
Patient calling the office for samples of medication: ? ? ?1.  What medication and dosage are you requesting samples for? sacubitril-valsartan (ENTRESTO) 49-51 MG ? ?2.  Are you currently out of this medication? yes ?  ?

## 2019-12-16 ENCOUNTER — Other Ambulatory Visit: Payer: Self-pay | Admitting: Pharmacist

## 2019-12-16 ENCOUNTER — Other Ambulatory Visit: Payer: Self-pay | Admitting: *Deleted

## 2019-12-16 MED ORDER — CARVEDILOL 6.25 MG PO TABS: 6.2500 mg | ORAL_TABLET | Freq: Two times a day (BID) | ORAL | 5 refills | Status: DC

## 2019-12-16 NOTE — Patient Outreach (Signed)
[facility]) Care Management  12/16/2019  Amy Barnes Oct 02, 1950 [id number]   Patient's case is being closed as the [facility] Team has transitioned to the Quality Department and will no longer document in [facility].   Patient will still be followed by [facility] Certified Pharmacy Technician, [name] for medication assistance.  [name], PharmD, BCACP [facility] Clinical Pharmacist [phone]

## 2019-12-21 ENCOUNTER — Other Ambulatory Visit: Payer: Self-pay

## 2019-12-21 ENCOUNTER — Encounter: Payer: Self-pay | Admitting: Internal Medicine

## 2019-12-21 ENCOUNTER — Ambulatory Visit (INDEPENDENT_AMBULATORY_CARE_PROVIDER_SITE_OTHER): Payer: Medicare Other | Admitting: Internal Medicine

## 2019-12-21 VITALS — BP 134/75 | HR 84 | Temp 97.6°F | Wt 368.0 lb

## 2019-12-21 DIAGNOSIS — T847XXD Infection and inflammatory reaction due to other internal orthopedic prosthetic devices, implants and grafts, subsequent encounter: Secondary | ICD-10-CM

## 2019-12-21 DIAGNOSIS — B3731 Acute candidiasis of vulva and vagina: Secondary | ICD-10-CM

## 2019-12-21 DIAGNOSIS — M4626 Osteomyelitis of vertebra, lumbar region: Secondary | ICD-10-CM

## 2019-12-21 DIAGNOSIS — A4902 Methicillin resistant Staphylococcus aureus infection, unspecified site: Secondary | ICD-10-CM

## 2019-12-21 DIAGNOSIS — B373 Candidiasis of vulva and vagina: Secondary | ICD-10-CM | POA: Diagnosis not present

## 2019-12-21 MED ORDER — DOXYCYCLINE HYCLATE 100 MG PO TABS: 100.0000 mg | ORAL_TABLET | Freq: Two times a day (BID) | ORAL | 3 refills | Status: DC

## 2019-12-21 MED ORDER — FLUCONAZOLE 200 MG PO TABS: 200.0000 mg | ORAL_TABLET | Freq: Every day | ORAL | 0 refills | Status: DC

## 2019-12-21 NOTE — Patient Instructions (Signed)
COVID-19 Vaccine Information can be found at: [email or web address] For questions related to vaccine distribution or appointments, please email [email or web address] or call [phone].    

## 2019-12-21 NOTE — Progress Notes (Signed)
RFV: follow up for MRSA HW infection  Patient ID: Amy Barnes, female   DOB: 17-Sep-1951, 69 y.o.   MRN: [id number]  HPI Hx of MRSA HW infection of spine, but in Feb 2020, found to have new onset CM unclear if NICM, possibly elevated high enough to think about takosubo's. She is on medication to manage CM and followed by cardiology with plan to recheck echo in 3 months to see if reversal is noted. Prior to onset, had wreching for 2 weeks from urologic medication. She is feeling better but not completely to baseline.   Outpatient Encounter Medications as of 12/21/2019  Medication Sig  . albuterol (PROVENTIL HFA;VENTOLIN HFA) 108 (90 Base) MCG/ACT inhaler Inhale 2 puffs into the lungs every 6 (six) hours as needed for wheezing or shortness of breath.  Marland Kitchen aspirin 81 MG EC tablet Take 81 mg by mouth at bedtime.   . budesonide-formoterol (SYMBICORT) 160-4.5 MCG/ACT inhaler Inhale 2 puffs into the lungs 2 (two) times daily.  . carvedilol (COREG) 6.25 MG tablet Take 1 tablet (6.25 mg total) by mouth 2 (two) times daily.  Marland Kitchen dicyclomine (BENTYL) 20 MG tablet Take 1 tablet (20 mg total) by mouth in the morning and at bedtime.  . docusate sodium (COLACE) 100 MG capsule Take 1 capsule (100 mg total) by mouth 2 (two) times daily.  Marland Kitchen FLUoxetine (PROZAC) 40 MG capsule Take by mouth daily.  Marland Kitchen lamoTRIgine (LAMICTAL) 200 MG tablet Take 200 mg by mouth daily.    Marland Kitchen levothyroxine (SYNTHROID) 150 MCG tablet Take 1 tablet (150 mcg total) by mouth at bedtime.  . montelukast (SINGULAIR) 10 MG tablet Take 1 tablet (10 mg total) by mouth at bedtime.  . Multiple Vitamin (MULTIVITAMIN WITH MINERALS) TABS tablet Take 2 tablets by mouth daily.   . ondansetron (ZOFRAN ODT) 8 MG disintegrating tablet Take 1 tablet (8 mg total) by mouth 2 (two) times daily as needed for nausea or vomiting.  . Oxcarbazepine (TRILEPTAL) 300 MG tablet Take 300-600 mg by mouth See admin instructions. Take 300 mg by mouth in the morning and 600 mg  at night  . oxybutynin (DITROPAN-XL) 10 MG 24 hr tablet Take 1 tablet (10 mg total) by mouth daily.  Marland Kitchen oxyCODONE 10 MG TABS Take 1 tablet (10 mg total) by mouth every 4 (four) hours as needed for severe pain ((score 7 to 10)).  Marland Kitchen pantoprazole (PROTONIX) 40 MG tablet Take 1 tablet (40 mg total) by mouth 2 (two) times daily.  . polyethylene glycol (MIRALAX / GLYCOLAX) 17 g packet Take 17 g by mouth 2 (two) times daily as needed for mild constipation.  . prochlorperazine (COMPAZINE) 10 MG tablet Take 1 tablet (10 mg total) by mouth every 6 (six) hours as needed for nausea or vomiting.  . promethazine (PHENERGAN) 25 MG suppository Place 1 suppository (25 mg total) rectally every 6 (six) hours as needed for nausea or vomiting.  . sacubitril-valsartan (ENTRESTO) 49-51 MG Take 1 tablet by mouth 2 (two) times daily.  . simvastatin (ZOCOR) 40 MG tablet Take 40 mg by mouth every evening.  . sucralfate (CARAFATE) 1 GM/10ML suspension Take 10 mLs (1 g total) by mouth 4 (four) times daily -  with meals and at bedtime.  . zaleplon (SONATA) 10 MG capsule Take 10 mg by mouth at bedtime as needed for sleep.  . furosemide (LASIX) 20 MG tablet Take 1 tablet (20 mg total) by mouth daily.   No facility-administered encounter medications on file as of 12/21/2019.  Patient Active Problem List   Diagnosis Date Noted  . Acute HFrEF (heart failure with reduced ejection fraction) ([place]) 11/25/2019  . UTI (urinary tract infection) 11/25/2019  . NSTEMI (non-ST elevated myocardial infarction) ([place]) 11/09/2019  . Acute pulmonary edema ([place]) 11/09/2019  . QT prolongation 11/09/2019  . Acute kidney injury ([place]) 11/04/2019  . Hyponatremia 11/03/2019  . Epigastric abdominal pain 11/03/2019  . Nausea and vomiting 11/03/2019  . Diminished pulses in lower extremity 08/11/2019  . Osteopenia 08/11/2019  . Wound infection complicating hardware ([place]) 04/01/2019  . Lumbar adjacent segment disease with spondylolisthesis  03/23/2019  . Bilateral lower extremity edema 11/29/2018  . Neurogenic urinary incontinence 11/29/2018  . Urinary incontinence 11/29/2018  . Bladder spasms 11/29/2018  . Discogenic low back pain 11/29/2018  . Chronic sacroiliac joint pain (Bilateral) (R>L) 11/29/2018  . Spondylosis without myelopathy or radiculopathy, lumbar region 11/09/2018  . Chronic low back pain (Bilateral) (L>R) w/o sciatica 11/09/2018  . History of hip replacement (Left) 10/13/2018  . History of back surgery 10/13/2018  . Vitamin D insufficiency 10/13/2018  . Osteoarthritis of facet joint of lumbar spine 10/13/2018  . Osteoarthritis involving multiple joints 10/13/2018  . Cervical radiculopathy (Left) 10/13/2018  . Lumbar facet arthropathy (Bilateral) 10/13/2018  . Lumbar facet syndrome (Bilateral) (L>R) 10/13/2018  . Abnormal MRI, lumbar spine (09/16/2018) 10/13/2018  . Lumbar nerve root compression (L4) (Left) 10/13/2018  . Lumbar foraminal stenosis 10/13/2018  . 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 Chronic low back pain (Primary Area of Pain) (Bilateral) (L>R) w/ sciatica (Left) 09/14/2018  . Chronic lower extremity pain (Secondary Area of Pain) (Left) 09/14/2018  . Chronic hip pain [facility] Area of Pain) (Bilateral) (L>R) 09/14/2018  . Chronic knee pain (Fourth Area of Pain) (Bilateral) (R>L) 09/14/2018  . Pharmacologic therapy 09/14/2018  . Disorder of skeletal system 09/14/2018  . Problems influencing health status 09/14/2018  . Primary osteoarthritis of first carpometacarpal joint of left hand 04/20/2018  . 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 01/16/2018  . PAH (pulmonary artery hypertension) ([place]) 08/03/2017  . Advanced care planning/counseling discussion 06/25/2017  . Synovial cyst of lumbar facet joint 11/13/2016  . Thoracic aortic atherosclerosis ([place])   . CAD (coronary artery disease) 07/30/2016  . Centrilobular  emphysema ([place]) 07/30/2016  . Ex-smoker 07/08/2016  . Nonspecific abnormal electrocardiogram (ECG) (EKG) 07/08/2016  . Chronic lumbar radicular pain (L4) (Bilateral) 06/09/2016  . DOE (dyspnea on exertion) 05/22/2016  . Degenerative joint disease (DJD) of hip 04/17/2016  . Somatic dysfunction of sacroiliac joint 04/17/2016  . S/P Lumbar Fusion (L3-4 PLIF) 04/17/2016  . Cervical fusion syndrome 04/17/2016  . Pedal edema 02/19/2016  . Chronic pain syndrome 02/19/2016  . Abdominal aortic atherosclerosis ([place]) 12/11/2015  . MRSA (methicillin resistant Staphylococcus aureus) infection 10/11/2012  . Urinary urgency 07/28/2012  . Infection or inflammatory reaction due to internal joint prosthesis ([place]) 12/03/2011  . Morbid obesity with BMI of 60.0-69.9, adult ([place]) 05/13/2011  . Essential hypertension 05/17/2010  . HIP PAIN 12/25/2009  . Cervical radiculopathy (Right) 12/25/2009  . Benign positional vertigo 07/21/2008  . Hyperlipidemia associated with type 2 diabetes mellitus ([place]) 08/16/2007  . Hypothyroidism 07/30/2007  . DM type 2 with diabetic peripheral neuropathy ([place]) 07/30/2007  . ANEMIA-NOS 07/30/2007  . Bipolar disorder ([place]) 07/30/2007  . Allergic rhinitis 07/30/2007  . Asthma 07/30/2007  . GERD 07/30/2007  . Osteoarthritis 07/30/2007  . OSA on CPAP 07/30/2007     Health  Maintenance Due  Topic Date Due  . DTAP VACCINES (1) 08/07/1951  . PNA vac Low Risk Adult (2 of 2 - PPSV23) 01/13/2019     Review of Systems 12 point ros negative except for fatigue, and chronic back pain, still some shortness of breath with exertion Physical Exam   BP 134/75   Pulse 84   Temp 97.6 F (36.4 C) (Oral)   Wt (!) 368 lb (166.9 kg)   LMP  (LMP Unknown)   BMI 63.17 kg/m   gen = a xo by 3 in NAD HEENT = PERRLA, EOMI, no scleral icterus, oral pharynx clear pulm = CTAB, no w/c/r Cors= nl s1, s2 no g/m/r Ext= no c/c/e CBC Lab Results  Component Value Date   WBC 5.6 11/25/2019    RBC 4.11 11/25/2019   HGB 11.6 (L) 11/25/2019   HCT 35.5 (L) 11/25/2019   PLT 333.0 11/25/2019   MCV 86.4 11/25/2019   MCH 28.4 11/14/2019   MCHC 32.7 11/25/2019   RDW 16.6 (H) 11/25/2019   LYMPHSABS 1.8 11/25/2019   MONOABS 0.6 11/25/2019   EOSABS 0.2 11/25/2019    BMET Lab Results  Component Value Date   NA 136 11/25/2019   K 4.7 11/25/2019   CL 101 11/25/2019   CO2 30 11/25/2019   GLUCOSE 125 (H) 11/25/2019   BUN 10 11/25/2019   CREATININE 0.70 11/25/2019   CALCIUM 9.4 11/25/2019   GFRNONAA >60 11/14/2019   GFRAA >60 11/14/2019    Lab Results  Component Value Date   ESRSEDRATE 11 11/05/2019   Lab Results  Component Value Date   CRP 2.9 (H) 11/05/2019     Assessment and Plan mrsa lumbar hw infection =  Continue on doxy 100mg  bid, likely plan to continue for extended period of time. Plan to see back in 3 months  Intermittent yeast infection - will give fluconazole if needed for outbreaks associated with yeast infection

## 2019-12-22 ENCOUNTER — Ambulatory Visit: Payer: Medicare Other | Admitting: Pharmacist

## 2019-12-23 ENCOUNTER — Other Ambulatory Visit (HOSPITAL_COMMUNITY)
Admission: RE | Admit: 2019-12-23 | Discharge: 2019-12-23 | Disposition: A | Discharge status: Home / Self Care | Payer: Medicare Other | Source: Ambulatory Visit | Attending: Cardiovascular Disease | Admitting: Cardiovascular Disease

## 2019-12-23 DIAGNOSIS — Z20822 Contact with and (suspected) exposure to covid-19: Secondary | ICD-10-CM | POA: Insufficient documentation

## 2019-12-23 DIAGNOSIS — Z01812 Encounter for preprocedural laboratory examination: Secondary | ICD-10-CM | POA: Insufficient documentation

## 2019-12-23 LAB — SARS CORONAVIRUS 2 (TAT 6-24 HRS): SARS Coronavirus 2: NEGATIVE

## 2019-12-26 ENCOUNTER — Ambulatory Visit (HOSPITAL_BASED_OUTPATIENT_CLINIC_OR_DEPARTMENT_OTHER): Payer: Medicare Other | Admitting: Cardiovascular Disease

## 2019-12-26 ENCOUNTER — Other Ambulatory Visit: Payer: Self-pay

## 2019-12-29 ENCOUNTER — Telehealth (INDEPENDENT_AMBULATORY_CARE_PROVIDER_SITE_OTHER): Payer: Medicare Other | Admitting: Cardiology

## 2019-12-29 ENCOUNTER — Encounter: Payer: Self-pay | Admitting: Cardiology

## 2019-12-29 VITALS — BP 152/72 | HR 67 | Ht 64.0 in | Wt 372.0 lb

## 2019-12-29 DIAGNOSIS — Z6841 Body Mass Index (BMI) 40.0 and over, adult: Secondary | ICD-10-CM | POA: Diagnosis not present

## 2019-12-29 DIAGNOSIS — I2510 Atherosclerotic heart disease of native coronary artery without angina pectoris: Secondary | ICD-10-CM

## 2019-12-29 DIAGNOSIS — E1142 Type 2 diabetes mellitus with diabetic polyneuropathy: Secondary | ICD-10-CM

## 2019-12-29 DIAGNOSIS — F3170 Bipolar disorder, currently in remission, most recent episode unspecified: Secondary | ICD-10-CM | POA: Diagnosis not present

## 2019-12-29 DIAGNOSIS — I428 Other cardiomyopathies: Secondary | ICD-10-CM | POA: Diagnosis not present

## 2019-12-29 MED ORDER — LOSARTAN POTASSIUM 50 MG PO TABS: 50.0000 mg | ORAL_TABLET | Freq: Every day | ORAL | 1 refills | Status: DC

## 2019-12-29 NOTE — Patient Instructions (Signed)
Medication Instructions:   STOP Entresto  Restart Losartan 50 mg daily  *If you need a refill on your cardiac medications before your next appointment, please call your pharmacy*    Testing/Procedures: Your physician has requested that you have an echocardiogram in May. Echocardiography is a painless test that uses sound waves to create images of your heart. It provides your doctor with information about the size and shape of your heart and how well your heart's chambers and valves are working. This procedure takes approximately one hour. There are no restrictions for this procedure. Our office will contact you to schedule this.    Follow-Up: At [facility], you and your health needs are our priority.  As part of our continuing mission to provide you with exceptional heart care, we have created designated Provider Care Teams.  These Care Teams include your primary Cardiologist (physician) and Advanced Practice Providers (APPs -  Physician Assistants and Nurse Practitioners) who all work together to provide you with the care you need, when you need it.  We recommend signing up for the patient portal called "MyChart".  Sign up information is provided on this After Visit Summary.  MyChart is used to connect with patients for Virtual Visits (Telemedicine).  Patients are able to view lab/test results, encounter notes, upcoming appointments, etc.  Non-urgent messages can be sent to your provider as well.   To learn more about what you can do with MyChart, go to [email or web address].    Your next appointment:   4 week(s)  The format for your next appointment:   In Person  Provider:   Dr. Antoine Poche or Corine Shelter, PA when Dr. Antoine Poche is in the office    Other Instructions Your physician has requested that you regularly monitor and record your blood pressure readings at home. Please use the same machine at the same time of day to check your readings and record them to bring to your  follow-up visit.

## 2019-12-29 NOTE — Progress Notes (Signed)
Virtual Visit via Video Note   This visit type was conducted due to national recommendations for restrictions regarding the COVID-19 Pandemic (e.g. social distancing) in an effort to limit this patient's exposure and mitigate transmission in our community.  Due to her co-morbid illnesses, this patient is at least at moderate risk for complications without adequate follow up.  This format is felt to be most appropriate for this patient at this time.  All issues noted in this document were discussed and addressed.  A limited physical exam was performed with this format.  Please refer to the patient's chart for her consent to telehealth for [facility].   Video visit attempted but due to technical issues it was changed to a phone visit.   The patient was identified using 2 identifiers.  Date:  12/29/2019   ID:  Amy Barnes, DOB Feb 09, 1951, MRN [id number]  Patient Location: Home Provider Location: Home  PCP:  [name], MD  Cardiologist:  Rollene Rotunda, MD  Electrophysiologist:  None   Evaluation Performed:  Follow-Up Visit  Chief Complaint:  Facial itching - weakness- fatigue.  History of Present Illness:    Amy Barnes is a 69 y.o. female with a history of obesity with a BMI >60, hypertension, COPD, remote sleep apnea, chronic back pain/ infection post surgery, and LBBB.  She was admitted to the emergency room in February 2021 with nausea and vomiting.  Her troponin was elevated.  Her D-dimer was elevated.  She had a CT scan that was negative for pulmonary embolism but did show coronary calcification and congestive heart failure.  Cardiology saw her in consult.  She apparently has been having problems with nausea and vomiting for the past month or so.  She was admitted at one point with N+V and a bump in her SCr- treated with hydration.  EGD was unremarkable.  She had a hepatobiliary scan today that was WNL.   Work up during her hospitalization in showed a new CM with an EF  of 35-40% and apical WMA by echo on 11/09/2019.  Previous echo 4 days earlier on 11/05/2019 showed an EF of 60-65%.  Diagnostic cath done 11/10/2019 showed normal coronaries.  She has been treated as NICM with Entresto, lasix, and Coreg.  Her Entresto dose was up titrated as an OP.  The patient was contacted today for follow up.  The patient's daughter was present and gave most of the history.  The patient is apparently been complaining of facial itching.  There is no rash apparent.  The patient's daughter also says she is markedly fatigued.  They attribute these new findings to the [facility].  I explained to them that facial itching without a rash and fatigue would not be common side effects from [facility].  They feel like this is the culprit, it is the only new medication she has been on recently and it was increased.  Patient was supposed to have a sleep study but this had to be rescheduled.  The patient does not have symptoms concerning for COVID-19 infection (fever, chills, cough, or new shortness of breath).    Past Medical History:  Diagnosis Date  . Allergic rhinitis   . Ambulates with cane    straight  . Anemia   . Anxiety   . Asthma    seasonal  . Bipolar affective disorder (HCC)    takes Synthroid meds for Bipolar  . CAD (coronary artery disease) 07/2016   by CT scan  . Carpal tunnel  syndrome    had surgery but occasional still has some issues per patient  . Cataract   . Centrilobular emphysema (HCC) 07/2016   by CT scan - pt not aware of this  . Constipation due to pain medication   . Depression with anxiety   . Diabetes mellitus    type 2 - no meds diet controlled  . GERD (gastroesophageal reflux disease)   . History of blood transfusion   . 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 Hyperlipidemia   . Hypertension   . OSA (obstructive sleep apnea)    no longer using cpap, uses a bed that raises and lowers hob  . Osteoarthritis   . Osteopenia  08/11/2019   DEXA 07/2019 - T -1.1 R femur (osteopenia)  . Pneumonia   . Restless legs   . Septic arthritis (HCC) 10/11/2012  . Shingles 06/30/2016  . Status post revision of total hip replacement bilateral   prosthetic infection R 2013, L 2015  . Thoracic aortic atherosclerosis (HCC) 11/207   by CT   Past Surgical History:  Procedure Laterality Date  . BREAST CYST ASPIRATION    . CARPAL TUNNEL RELEASE Right 05/15/2011  . CARPAL TUNNEL RELEASE Left 12/24/2017   Procedure: LEFT CARPAL TUNNEL RELEASE;  Surgeon: [name], MD;  Location: [facility];  Service: Orthopedics;  Laterality: Left;  . CERVICAL FUSION  2012   C2/3/4  . COLONOSCOPY WITH PROPOFOL N/A 12/31/2015   diverticulosis, int hem, o/w normal rpt 10 yrs (Rein)  . ESOPHAGOGASTRODUODENOSCOPY (EGD) WITH PROPOFOL N/A 11/12/2019   Procedure: ESOPHAGOGASTRODUODENOSCOPY (EGD) WITH PROPOFOL;  Surgeon: Sherrilyn Rist, MD;  Location: [facility];  Service: Gastroenterology;  Laterality: N/A;  . EYE SURGERY Bilateral    cataract surgery with lens implant  . FOOT SURGERY  1982   bone spur  . I & D EXTREMITY  09/06/2012   Budd Palmer, MD; Right;  I&D of right thigh  . I & D EXTREMITY Left 07/2013   wound vac - daily doxycycline indefinitely  . KNEE ARTHROSCOPY  09/01/2012   Dannielle Huh, MD;  Right  . LEFT HEART CATH AND CORONARY ANGIOGRAPHY N/A 11/10/2019   Procedure: LEFT HEART CATH AND CORONARY ANGIOGRAPHY;  Surgeon: Lennette Bihari, MD;  Location: [facility];  Service: Cardiovascular;  Laterality: N/A;  . LUMBAR FUSION  01/08/2012   L3-4  . LUMBAR FUSION  02/2019   unexpectedly discovered MRSA infection - pus Lovell Sheehan)   . LUMBAR LAMINECTOMY/DECOMPRESSION MICRODISCECTOMY N/A 11/13/2016   LAMINOTOMY/LAMINECTOMY LUMBAR FOUR LUMBAR FIVE  WITH RESECTION OF SYNOVIAL CYST;  Surgeon: Tressie Stalker, MD  . NECK SURGERY     Herniated disk C2,3,4  . NOSE SURGERY    . PARTIAL HYSTERECTOMY  1984   for  mennorhagia, ovaries remain  . REVISION TOTAL HIP ARTHROPLASTY Left 08/28/2011  . TMJ ARTHROPLASTY  1982  . TOTAL HIP ARTHROPLASTY Left 2002  . TRIGGER FINGER RELEASE Right 05/15/2011   long finger     Current Meds  Medication Sig  . albuterol (PROVENTIL HFA;VENTOLIN HFA) 108 (90 Base) MCG/ACT inhaler Inhale 2 puffs into the lungs every 6 (six) hours as needed for wheezing or shortness of breath.  Marland Kitchen aspirin 81 MG EC tablet Take 81 mg by mouth at bedtime.   . budesonide-formoterol (SYMBICORT) 160-4.5 MCG/ACT inhaler Inhale 2 puffs into the lungs 2 (two) times daily.  . carvedilol (COREG) 6.25 MG tablet  Take 1 tablet (6.25 mg total) by mouth 2 (two) times daily.  Marland Kitchen dicyclomine (BENTYL) 20 MG tablet Take 1 tablet (20 mg total) by mouth in the morning and at bedtime.  . docusate sodium (COLACE) 100 MG capsule Take 1 capsule (100 mg total) by mouth 2 (two) times daily.  Marland Kitchen doxycycline (VIBRA-TABS) 100 MG tablet Take 1 tablet (100 mg total) by mouth 2 (two) times daily.  . fluconazole (DIFLUCAN) 200 MG tablet Take 1 tablet (200 mg total) by mouth daily. X 5 days as needed for yeast infection  . FLUoxetine (PROZAC) 40 MG capsule Take by mouth daily.  Marland Kitchen lamoTRIgine (LAMICTAL) 200 MG tablet Take 200 mg by mouth daily.    Marland Kitchen levothyroxine (SYNTHROID) 150 MCG tablet Take 1 tablet (150 mcg total) by mouth at bedtime.  . montelukast (SINGULAIR) 10 MG tablet Take 1 tablet (10 mg total) by mouth at bedtime.  . Multiple Vitamin (MULTIVITAMIN WITH MINERALS) TABS tablet Take 2 tablets by mouth daily.   . ondansetron (ZOFRAN ODT) 8 MG disintegrating tablet Take 1 tablet (8 mg total) by mouth 2 (two) times daily as needed for nausea or vomiting.  . Oxcarbazepine (TRILEPTAL) 300 MG tablet Take 300-600 mg by mouth See admin instructions. Take 300 mg by mouth in the morning and 600 mg at night  . oxybutynin (DITROPAN-XL) 10 MG 24 hr tablet Take 1 tablet (10 mg total) by mouth daily.  Marland Kitchen oxyCODONE 10 MG TABS Take 1  tablet (10 mg total) by mouth every 4 (four) hours as needed for severe pain ((score 7 to 10)).  Marland Kitchen pantoprazole (PROTONIX) 40 MG tablet Take 1 tablet (40 mg total) by mouth 2 (two) times daily.  . polyethylene glycol (MIRALAX / GLYCOLAX) 17 g packet Take 17 g by mouth 2 (two) times daily as needed for mild constipation.  . prochlorperazine (COMPAZINE) 10 MG tablet Take 1 tablet (10 mg total) by mouth every 6 (six) hours as needed for nausea or vomiting.  . promethazine (PHENERGAN) 25 MG suppository Place 1 suppository (25 mg total) rectally every 6 (six) hours as needed for nausea or vomiting.  . sacubitril-valsartan (ENTRESTO) 49-51 MG Take 1 tablet by mouth 2 (two) times daily.  . simvastatin (ZOCOR) 40 MG tablet Take 40 mg by mouth every evening.  . sucralfate (CARAFATE) 1 GM/10ML suspension Take 10 mLs (1 g total) by mouth 4 (four) times daily -  with meals and at bedtime.  . zaleplon (SONATA) 10 MG capsule Take 10 mg by mouth at bedtime as needed for sleep.     Allergies:   Cephalexin, Hydrocodone, Tolterodine tartrate, Risperidone and related, Seroquel [quetiapine fumarate], and Sulfa antibiotics   Social History   Tobacco Use  . Smoking status: Former Smoker    Packs/day: 1.50    Years: 40.00    Pack years: 60.00    Types: Cigarettes    Quit date: 04/29/2009    Years since quitting: 10.6  . Smokeless tobacco: Never Used  Substance Use Topics  . Alcohol use: Yes    Alcohol/week: 21.0 standard drinks    Types: 21 Shots of liquor per week    Comment: 3 shots/per night  . Drug use: No     Family Hx: The patient's family history includes Alcohol abuse in her father; Aneurysm (age of onset: 88) in her father; CAD in an other family member; Cancer in her brother and father; Diabetes in her brother and sister. There is no history of Anesthesia problems, Hypotension,  Malignant hyperthermia, Pseudochol deficiency, or Breast cancer.  ROS:   Please see the history of present illness.      All other systems reviewed and are negative.   Prior CV studies:   The following studies were reviewed today: Echo 11/09/2019 Cath 11/10/2019   Labs/Other Tests and Data Reviewed:    EKG:  An ECG dated 2/12/021 was personally reviewed today and demonstrated:  NSR, LBBB- HR 70  Recent Labs: 11/09/2019: TSH 1.050 11/11/2019: B Natriuretic Peptide 143.6 11/14/2019: Magnesium 2.0 11/25/2019: ALT 19; BUN 10; Creatinine, Ser 0.70; Hemoglobin 11.6; Platelets 333.0; Potassium 4.7; Sodium 136   Recent Lipid Panel Lab Results  Component Value Date/Time   CHOL 139 11/09/2019 05:04 AM   CHOL 133 06/18/2015 12:00 AM   TRIG 41 11/09/2019 05:04 AM   TRIG 62 06/18/2015 12:00 AM   HDL 58 11/09/2019 05:04 AM   CHOLHDL 2.4 11/09/2019 05:04 AM   LDLCALC 73 11/09/2019 05:04 AM   LDLCALC 57 06/18/2015 12:00 AM   LDLDIRECT 48.0 03/13/2016 10:25 AM    Wt Readings from Last 3 Encounters:  12/29/19 (!) 372 lb (168.7 kg)  12/21/19 (!) 368 lb (166.9 kg)  12/05/19 (!) 374 lb (169.6 kg)     Objective:    Vital Signs:  BP (!) 152/72   Pulse 67   Ht 5\' 4"  (1.626 m)   Wt (!) 372 lb (168.7 kg)   LMP  (LMP Unknown)   BMI 63.85 kg/m    VITAL SIGNS:  reviewed  ASSESSMENT & PLAN:    NICM- Troponin does not seem high enough for a Takostubo event- but the drop in EF was sudden.  Troponin not that high though.  HTN- Not yet at goal- 130/80 or less  Obesity- BMI 63  H/O asthmatic COPD- On inhalers  Hypothyroid- TSH WNL  Bipolar- On Lamictal and Trileptal  Sleep apnea- Sleep study re scheduled for later in April  Plan: I'm doubtful that the patient's complaints of facial itching and fatigue are secondary to [facility] but to rule this out I've asked them to hold the [facility] and start Losartan 50 mg daily which they already have at home.  OV in 4 weeks.  Follow through with sleep study later this month. She will need an echo in May- further recommendations following that  COVID-19  Education: The signs and symptoms of COVID-19 were discussed with the patient and how to seek care for testing (follow up with PCP or arrange E-visit).  The importance of social distancing was discussed today.  Time:   Today, I have spent 20 minutes with the patient with telehealth technology discussing the above problems.     Medication Adjustments/Labs and Tests Ordered: Current medicines are reviewed at length with the patient today.  Concerns regarding medicines are outlined above.   Tests Ordered: No orders of the defined types were placed in this encounter.   Medication Changes: No orders of the defined types were placed in this encounter.   Follow Up:  In Person Dr Percival Spanish or me when he si in the office - in 4 weeks.   Angelena Form, PA-C  12/29/2019 12:54 PM    [facility]

## 2019-12-29 NOTE — Addendum Note (Signed)
Addended by: Evans Lance on: 12/29/2019 01:53 PM   Modules accepted: Orders

## 2020-01-05 ENCOUNTER — Other Ambulatory Visit: Payer: Self-pay | Admitting: *Deleted

## 2020-01-05 NOTE — Patient Outreach (Signed)
[facility]) Care Management  01/05/2020  Amy Barnes 03-08-1951 [id number]   Call placed to member to follow up on management of chronic medical conditions, no answer.  HIPAA compliant voice message left.  Will follow up within the next 3-4 business days.  Kemper Durie, [place], MSN [facility] Care Management  [facility] Manager [phone]

## 2020-01-08 ENCOUNTER — Encounter: Payer: Self-pay | Admitting: Family Medicine

## 2020-01-09 ENCOUNTER — Other Ambulatory Visit: Payer: Self-pay

## 2020-01-09 MED ORDER — SIMVASTATIN 40 MG PO TABS: 40.0000 mg | ORAL_TABLET | Freq: Every evening | ORAL | 1 refills | Status: DC

## 2020-01-10 ENCOUNTER — Other Ambulatory Visit: Payer: Self-pay | Admitting: Family Medicine

## 2020-01-10 DIAGNOSIS — M1990 Unspecified osteoarthritis, unspecified site: Secondary | ICD-10-CM

## 2020-01-11 ENCOUNTER — Other Ambulatory Visit: Payer: Self-pay | Admitting: *Deleted

## 2020-01-11 NOTE — Telephone Encounter (Signed)
Celebrex Last filled:  09/09/19, #90 Last OV:  11/25/19, hosp f/u Next OV:  01/27/20, AWV prt 2

## 2020-01-11 NOTE — Patient Outreach (Signed)
[facility]) Care Management  01/11/2020  MARYN FREELOVE Feb 22, 1951 [id number]   Outreach attempt #2, successful.    Call placed to member to follow up on management of chronic medical conditions.  She report she is doing much better, denies the ongoing abdominal issues for which she was previously hospitalized.  State all other medical conditions have been well managed as well.  Daughter remains in the home to provide support and transportation to appointments as well as helping to manage her overall care.  Denies any urgent needs at this time, will follow up within the next month.  If remains stable, will consider transition to health coach.  [facility] CM Care Plan Problem One     Most Recent Value  Care Plan Problem One  Risk for readmission related to new diagnosis of heart failure  Role Documenting the Problem One  Care Management [place] for Problem One  Active  [facility] Long Term Goal   Member will not be readmitted to hospital within the next 31 days  [facility] Long Term Goal Start Date  11/15/19  [facility] Long Term Goal Met Date  01/11/20  [facility] CM Short Term Goal #1   Member will report checking weights daily over the next 4 weeks  [facility] CM Short Term Goal #1 Start Date  11/15/19  [facility] CM Short Term Goal #1 Met Date  01/11/20  [facility] CM Short Term Goal #2   Member will report taking medications as instructed over the next 3 weeks  [facility] CM Short Term Goal #2 Start Date  11/15/19  [facility] CM Short Term Goal #2 Met Date  01/11/20     Valente David, RN, MSN [place] [phone]

## 2020-01-13 ENCOUNTER — Other Ambulatory Visit: Payer: Self-pay

## 2020-01-13 MED ORDER — CELECOXIB 200 MG PO CAPS: 200.0000 mg | ORAL_CAPSULE | Freq: Every day | ORAL | 1 refills | Status: DC

## 2020-01-13 MED ORDER — FUROSEMIDE 20 MG PO TABS: 20.0000 mg | ORAL_TABLET | Freq: Every day | ORAL | 0 refills | Status: DC | PRN

## 2020-01-13 NOTE — Telephone Encounter (Signed)
Spoke with Amy Barnes relaying Dr. Timoteo Expose message.  Amy Barnes verbalizes understanding but states she will not be able to move otherwise.  Says the Celebrex helps with her pain.  She's asking what else Dr. Reece Agar recommends.  Plz advise.  [Can respond by phn or MyChart.]

## 2020-01-13 NOTE — Telephone Encounter (Signed)
plz call patient - I don't recommend celebrex given recent NSTEMI earlier this year.

## 2020-01-13 NOTE — Telephone Encounter (Addendum)
Spoke with patient and daughter - they are aware of cardiovascular risk in setting of recent NSTEMI but are comfortable taking this on if it means she is more mobile which improves her quality of life.  celebrex refilled They also request lasix refill as they have been unable to get in touch with cardiology for a refill .

## 2020-01-13 NOTE — Addendum Note (Signed)
Addended by: [name] on: 01/13/2020 05:24 PM   Modules accepted: Orders

## 2020-01-20 ENCOUNTER — Other Ambulatory Visit (INDEPENDENT_AMBULATORY_CARE_PROVIDER_SITE_OTHER): Payer: Medicare Other

## 2020-01-20 ENCOUNTER — Ambulatory Visit (INDEPENDENT_AMBULATORY_CARE_PROVIDER_SITE_OTHER): Payer: Medicare Other

## 2020-01-20 ENCOUNTER — Other Ambulatory Visit: Payer: Self-pay

## 2020-01-20 ENCOUNTER — Other Ambulatory Visit: Payer: Self-pay | Admitting: Family Medicine

## 2020-01-20 VITALS — BP 133/64 | Wt 368.0 lb

## 2020-01-20 DIAGNOSIS — E559 Vitamin D deficiency, unspecified: Secondary | ICD-10-CM

## 2020-01-20 DIAGNOSIS — E039 Hypothyroidism, unspecified: Secondary | ICD-10-CM | POA: Diagnosis not present

## 2020-01-20 DIAGNOSIS — F3170 Bipolar disorder, currently in remission, most recent episode unspecified: Secondary | ICD-10-CM | POA: Diagnosis not present

## 2020-01-20 DIAGNOSIS — Z0000 Encounter for general adult medical examination without abnormal findings: Secondary | ICD-10-CM | POA: Diagnosis not present

## 2020-01-20 DIAGNOSIS — M25552 Pain in left hip: Secondary | ICD-10-CM

## 2020-01-20 DIAGNOSIS — T847XXD Infection and inflammatory reaction due to other internal orthopedic prosthetic devices, implants and grafts, subsequent encounter: Secondary | ICD-10-CM

## 2020-01-20 DIAGNOSIS — G8929 Other chronic pain: Secondary | ICD-10-CM | POA: Diagnosis not present

## 2020-01-20 DIAGNOSIS — E785 Hyperlipidemia, unspecified: Secondary | ICD-10-CM

## 2020-01-20 DIAGNOSIS — Z96642 Presence of left artificial hip joint: Secondary | ICD-10-CM | POA: Diagnosis not present

## 2020-01-20 DIAGNOSIS — E1169 Type 2 diabetes mellitus with other specified complication: Secondary | ICD-10-CM | POA: Diagnosis not present

## 2020-01-20 DIAGNOSIS — M7138 Other bursal cyst, other site: Secondary | ICD-10-CM | POA: Diagnosis not present

## 2020-01-20 DIAGNOSIS — M5136 Other intervertebral disc degeneration, lumbar region: Secondary | ICD-10-CM | POA: Diagnosis not present

## 2020-01-20 LAB — COMPREHENSIVE METABOLIC PANEL WITH GFR
ALT: 20 U/L (ref 0–35)
AST: 16 U/L (ref 0–37)
Albumin: 4.2 g/dL (ref 3.5–5.2)
Alkaline Phosphatase: 86 U/L (ref 39–117)
BUN: 24 mg/dL — ABNORMAL HIGH (ref 6–23)
CO2: 30 meq/L (ref 19–32)
Calcium: 9.3 mg/dL (ref 8.4–10.5)
Chloride: 103 meq/L (ref 96–112)
Creatinine, Ser: 0.71 mg/dL (ref 0.40–1.20)
GFR: 81.96 mL/min
Glucose, Bld: 161 mg/dL — ABNORMAL HIGH (ref 70–99)
Potassium: 4.7 meq/L (ref 3.5–5.1)
Sodium: 140 meq/L (ref 135–145)
Total Bilirubin: 0.4 mg/dL (ref 0.2–1.2)
Total Protein: 6.4 g/dL (ref 6.0–8.3)

## 2020-01-20 LAB — VITAMIN D 25 HYDROXY (VIT D DEFICIENCY, FRACTURES): VITD: 29.13 ng/mL — ABNORMAL LOW (ref 30.00–100.00)

## 2020-01-20 LAB — CBC WITH DIFFERENTIAL/PLATELET
Basophils Absolute: 0 10*3/uL (ref 0.0–0.1)
Basophils Relative: 0.4 % (ref 0.0–3.0)
Eosinophils Absolute: 0.3 10*3/uL (ref 0.0–0.7)
Eosinophils Relative: 4.2 % (ref 0.0–5.0)
HCT: 32.5 % — ABNORMAL LOW (ref 36.0–46.0)
Hemoglobin: 10.7 g/dL — ABNORMAL LOW (ref 12.0–15.0)
Lymphocytes Relative: 20.2 % (ref 12.0–46.0)
Lymphs Abs: 1.2 10*3/uL (ref 0.7–4.0)
MCHC: 32.9 g/dL (ref 30.0–36.0)
MCV: 88.7 fl (ref 78.0–100.0)
Monocytes Absolute: 0.6 10*3/uL (ref 0.1–1.0)
Monocytes Relative: 9 % (ref 3.0–12.0)
Neutro Abs: 4 10*3/uL (ref 1.4–7.7)
Neutrophils Relative %: 66.2 % (ref 43.0–77.0)
Platelets: 256 10*3/uL (ref 150.0–400.0)
RBC: 3.66 Mil/uL — ABNORMAL LOW (ref 3.87–5.11)
RDW: 16.3 % — ABNORMAL HIGH (ref 11.5–15.5)
WBC: 6.1 10*3/uL (ref 4.0–10.5)

## 2020-01-20 LAB — TSH: TSH: 2.42 u[IU]/mL (ref 0.35–4.50)

## 2020-01-20 LAB — LIPID PANEL
Cholesterol: 126 mg/dL (ref 0–200)
HDL: 52.4 mg/dL
LDL Cholesterol: 58 mg/dL (ref 0–99)
NonHDL: 73.14
Total CHOL/HDL Ratio: 2
Triglycerides: 74 mg/dL (ref 0.0–149.0)
VLDL: 14.8 mg/dL (ref 0.0–40.0)

## 2020-01-20 LAB — C-REACTIVE PROTEIN: CRP: 2.3 mg/dL (ref 0.5–20.0)

## 2020-01-20 LAB — SEDIMENTATION RATE: Sed Rate: 54 mm/hr — ABNORMAL HIGH (ref 0–30)

## 2020-01-20 NOTE — Progress Notes (Signed)
PCP notes:  Health Maintenance: Pneumococcal 23- declined   Abnormal Screenings: none   Patient concerns: Discuss diabetic medication   Nurse concerns: none   Next PCP appt.: 01/27/2020 @ 11:30 am

## 2020-01-20 NOTE — Patient Instructions (Signed)
Amy Barnes , Thank you for taking time to come for your Medicare Wellness Visit. I appreciate your ongoing commitment to your health goals. Please review the following plan we discussed and let me know if I can assist you in the future.   Screening recommendations/referrals: Colonoscopy: Up to date, completed 12/31/2015 Mammogram: Up to date, completed 08/10/2019 Bone Density: Up to date, completed 08/09/2019 Recommended yearly ophthalmology/optometry visit for glaucoma screening and checkup Recommended yearly dental visit for hygiene and checkup  Vaccinations: Influenza vaccine: Fall 2021 Pneumococcal vaccine: declined Tdap vaccine: decline Shingles vaccine: discussed    Advanced directives: copy in chart  Conditions/risks identified: diabetes, hypertension  Next appointment: 01/27/2020 @ 11:30 am    Preventive Care 65 Years and Older, Female Preventive care refers to lifestyle choices and visits with your health care provider that can promote health and wellness. What does preventive care include?  A yearly physical exam. This is also called an annual well check.  Dental exams once or twice a year.  Routine eye exams. Ask your health care provider how often you should have your eyes checked.  Personal lifestyle choices, including:  Daily care of your teeth and gums.  Regular physical activity.  Eating a healthy diet.  Avoiding tobacco and drug use.  Limiting alcohol use.  Practicing safe sex.  Taking low-dose aspirin every day.  Taking vitamin and mineral supplements as recommended by your health care provider. What happens during an annual well check? The services and screenings done by your health care provider during your annual well check will depend on your age, overall health, lifestyle risk factors, and family history of disease. Counseling  Your health care provider may ask you questions about your:  Alcohol use.  Tobacco use.  Drug use.  Emotional  well-being.  Home and relationship well-being.  Sexual activity.  Eating habits.  History of falls.  Memory and ability to understand (cognition).  Work and work Astronomer.  Reproductive health. Screening  You may have the following tests or measurements:  Height, weight, and BMI.  Blood pressure.  Lipid and cholesterol levels. These may be checked every 5 years, or more frequently if you are over 79 years old.  Skin check.  Lung cancer screening. You may have this screening every year starting at age 46 if you have a 30-pack-year history of smoking and currently smoke or have quit within the past 15 years.  Fecal occult blood test (FOBT) of the stool. You may have this test every year starting at age 55.  Flexible sigmoidoscopy or colonoscopy. You may have a sigmoidoscopy every 5 years or a colonoscopy every 10 years starting at age 90.  Hepatitis C blood test.  Hepatitis B blood test.  Sexually transmitted disease (STD) testing.  Diabetes screening. This is done by checking your blood sugar (glucose) after you have not eaten for a while (fasting). You may have this done every 1-3 years.  Bone density scan. This is done to screen for osteoporosis. You may have this done starting at age 57.  Mammogram. This may be done every 1-2 years. Talk to your health care provider about how often you should have regular mammograms. Talk with your health care provider about your test results, treatment options, and if necessary, the need for more tests. Vaccines  Your health care provider may recommend certain vaccines, such as:  Influenza vaccine. This is recommended every year.  Tetanus, diphtheria, and acellular pertussis (Tdap, Td) vaccine. You may need a Td booster  every 10 years.  Zoster vaccine. You may need this after age 80.  Pneumococcal 13-valent conjugate (PCV13) vaccine. One dose is recommended after age 88.  Pneumococcal polysaccharide (PPSV23) vaccine. One  dose is recommended after age 62. Talk to your health care provider about which screenings and vaccines you need and how often you need them. This information is not intended to replace advice given to you by your health care provider. Make sure you discuss any questions you have with your health care provider. Document Released: 10/12/2015 Document Revised: 06/04/2016 Document Reviewed: 07/17/2015 Elsevier Interactive Patient Education  2017 [place] Prevention in the Home Falls can cause injuries. They can happen to people of all ages. There are many things you can do to make your home safe and to help prevent falls. What can I do on the outside of my home?  Regularly fix the edges of walkways and driveways and fix any cracks.  Remove anything that might make you trip as you walk through a door, such as a raised step or threshold.  Trim any bushes or trees on the path to your home.  Use bright outdoor lighting.  Clear any walking paths of anything that might make someone trip, such as rocks or tools.  Regularly check to see if handrails are loose or broken. Make sure that both sides of any steps have handrails.  Any raised decks and porches should have guardrails on the edges.  Have any leaves, snow, or ice cleared regularly.  Use sand or salt on walking paths during winter.  Clean up any spills in your garage right away. This includes oil or grease spills. What can I do in the bathroom?  Use night lights.  Install grab bars by the toilet and in the tub and shower. Do not use towel bars as grab bars.  Use non-skid mats or decals in the tub or shower.  If you need to sit down in the shower, use a plastic, non-slip stool.  Keep the floor dry. Clean up any water that spills on the floor as soon as it happens.  Remove soap buildup in the tub or shower regularly.  Attach bath mats securely with double-sided non-slip rug tape.  Do not have throw rugs and other  things on the floor that can make you trip. What can I do in the bedroom?  Use night lights.  Make sure that you have a light by your bed that is easy to reach.  Do not use any sheets or blankets that are too big for your bed. They should not hang down onto the floor.  Have a firm chair that has side arms. You can use this for support while you get dressed.  Do not have throw rugs and other things on the floor that can make you trip. What can I do in the kitchen?  Clean up any spills right away.  Avoid walking on wet floors.  Keep items that you use a lot in easy-to-reach places.  If you need to reach something above you, use a strong step stool that has a grab bar.  Keep electrical cords out of the way.  Do not use floor polish or wax that makes floors slippery. If you must use wax, use non-skid floor wax.  Do not have throw rugs and other things on the floor that can make you trip. What can I do with my stairs?  Do not leave any items on the stairs.  Make  sure that there are handrails on both sides of the stairs and use them. Fix handrails that are broken or loose. Make sure that handrails are as long as the stairways.  Check any carpeting to make sure that it is firmly attached to the stairs. Fix any carpet that is loose or worn.  Avoid having throw rugs at the top or bottom of the stairs. If you do have throw rugs, attach them to the floor with carpet tape.  Make sure that you have a light switch at the top of the stairs and the bottom of the stairs. If you do not have them, ask someone to add them for you. What else can I do to help prevent falls?  Wear shoes that:  Do not have high heels.  Have rubber bottoms.  Are comfortable and fit you well.  Are closed at the toe. Do not wear sandals.  If you use a stepladder:  Make sure that it is fully opened. Do not climb a closed stepladder.  Make sure that both sides of the stepladder are locked into place.  Ask  someone to hold it for you, if possible.  Clearly mark and make sure that you can see:  Any grab bars or handrails.  First and last steps.  Where the edge of each step is.  Use tools that help you move around (mobility aids) if they are needed. These include:  Canes.  Walkers.  Scooters.  Crutches.  Turn on the lights when you go into a dark area. Replace any light bulbs as soon as they burn out.  Set up your furniture so you have a clear path. Avoid moving your furniture around.  If any of your floors are uneven, fix them.  If there are any pets around you, be aware of where they are.  Review your medicines with your doctor. Some medicines can make you feel dizzy. This can increase your chance of falling. Ask your doctor what other things that you can do to help prevent falls. This information is not intended to replace advice given to you by your health care provider. Make sure you discuss any questions you have with your health care provider. Document Released: 07/12/2009 Document Revised: 02/21/2016 Document Reviewed: 10/20/2014 Elsevier Interactive Patient Education  2017 [employer].

## 2020-01-20 NOTE — Progress Notes (Signed)
Subjective:   Amy Barnes is a 69 y.o. female who presents for Medicare Annual (Subsequent) preventive examination.  Review of Systems: N/A    This visit is being conducted through telemedicine via telephone at the nurse health advisor's home address due to the COVID-19 pandemic. This patient has given me verbal consent via doximity to conduct this visit, patient states they are participating from their home address. Patient and myself are on the telephone call. There is no referral for this visit. Some vital signs may be absent or patient reported.    Patient identification: identified by name, DOB, and current address   Cardiac Risk Factors include: advanced age (>54men, >69 women);diabetes mellitus;hypertension     Objective:     Vitals: BP 133/64   Wt (!) 368 lb (166.9 kg)   LMP  (LMP Unknown)   BMI 63.17 kg/m   Body mass index is 63.17 kg/m.  Advanced Directives 01/20/2020 11/15/2019 11/09/2019 11/08/2019 11/04/2019 10/31/2019 10/30/2019  Does Patient Have a Medical Advance Directive? Yes No - No No No No  Type of Estate agent of [place];Living will - - - - - -  Does patient want to make changes to medical advance directive? - - - - - - -  Copy of Healthcare Power of Attorney in Chart? Yes - validated most recent copy scanned in chart (See row information) - - - - - -  Would patient like information on creating a medical advance directive? - No - Patient declined No - Patient declined No - Patient declined No - Patient declined - No - Patient declined  Pre-existing out of facility DNR order (yellow form or pink MOST form) - - - - - - -    Tobacco Social History   Tobacco Use  Smoking Status Former Smoker  . Packs/day: 1.50  . Years: 40.00  . Pack years: 60.00  . Types: Cigarettes  . Quit date: 04/29/2009  . Years since quitting: 10.7  Smokeless Tobacco Never Used     Counseling given: Not Answered   Clinical Intake:  Pre-visit preparation  completed: Yes  Pain : 0-10 Pain Score: 4  Pain Type: Chronic pain Pain Location: Knee Pain Descriptors / Indicators: Aching Pain Onset: More than a month ago Pain Frequency: Intermittent     Nutritional Risks: None Diabetes: Yes CBG done?: No Did pt. bring in CBG monitor from home?: No  How often do you need to have someone help you when you read instructions, pamphlets, or other written materials from your doctor or pharmacy?: 1 - Never What is the last grade level you completed in school?: some college  Interpreter Needed?: No  Information entered by :: CJohnson, LPN  Past Medical History:  Diagnosis Date  . Allergic rhinitis   . Ambulates with cane    straight  . Anemia   . Anxiety   . Asthma    seasonal  . Bipolar affective disorder (HCC)    takes Synthroid meds for Bipolar  . CAD (coronary artery disease) 07/2016   by CT scan  . Carpal tunnel syndrome    had surgery but occasional still has some issues per patient  . Cataract   . Centrilobular emphysema (HCC) 07/2016   by CT scan - pt not aware of this  . Constipation due to pain medication   . Depression with anxiety   . Diabetes mellitus    type 2 - no meds diet controlled  . GERD (gastroesophageal reflux disease)   .  History of blood transfusion   . 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 Hyperlipidemia   . Hypertension   . OSA (obstructive sleep apnea)    no longer using cpap, uses a bed that raises and lowers hob  . Osteoarthritis   . Osteopenia 08/11/2019   DEXA 07/2019 - T -1.1 R femur (osteopenia)  . Pneumonia   . Restless legs   . Septic arthritis (HCC) 10/11/2012  . Shingles 06/30/2016  . Status post revision of total hip replacement bilateral   prosthetic infection R 2013, L 2015  . Thoracic aortic atherosclerosis (HCC) 11/207   by CT   Past Surgical History:  Procedure Laterality Date  . BREAST CYST ASPIRATION    . CARPAL TUNNEL RELEASE Right  05/15/2011  . CARPAL TUNNEL RELEASE Left 12/24/2017   Procedure: LEFT CARPAL TUNNEL RELEASE;  Surgeon: [name], MD;  Location: [facility];  Service: Orthopedics;  Laterality: Left;  . CERVICAL FUSION  2012   C2/3/4  . COLONOSCOPY WITH PROPOFOL N/A 12/31/2015   diverticulosis, int hem, o/w normal rpt 10 yrs (Rein)  . ESOPHAGOGASTRODUODENOSCOPY (EGD) WITH PROPOFOL N/A 11/12/2019   Procedure: ESOPHAGOGASTRODUODENOSCOPY (EGD) WITH PROPOFOL;  Surgeon: Sherrilyn Rist, MD;  Location[facility] END[facility]COPY;  Service: Gastroenterology;  Laterality: N/A;  . EYE SURGERY Bilateral    cataract surgery with lens implant  . FOOT SURGERY  1982   bone spur  . I & D EXTREMITY  09/06/2012   Budd Palmer, MD; Right;  I&D of right thigh  . I & D EXTREMITY Left 07/2013   wound vac - daily doxycycline indefinitely  . KNEE ARTHROSCOPY  09/01/2012   Dannielle Huh, MD;  Right  . LEFT HEART CATH AND CORONARY ANGIOGRAPHY N/A 11/10/2019   Procedure: LEFT HEART CATH AND CORONARY ANGIOGRAPHY;  Surgeon: Lennette Bihari, MD;  Location: [facility] INVASIVE CV LAB;  Service: Cardiovascular;  Laterality: N/A;  . LUMBAR FUSION  01/08/2012   L3-4  . LUMBAR FUSION  02/2019   unexpectedly discovered MRSA infection - pus Lovell Sheehan)   . LUMBAR LAMINECTOMY/DECOMPRESSION MICRODISCECTOMY N/A 11/13/2016   LAMINOTOMY/LAMINECTOMY LUMBAR FOUR LUMBAR FIVE  WITH RESECTION OF SYNOVIAL CYST;  Surgeon: Tressie Stalker, MD  . NECK SURGERY     Herniated disk C2,3,4  . NOSE SURGERY    . PARTIAL HYSTERECTOMY  1984   for mennorhagia, ovaries remain  . REVISION TOTAL HIP ARTHROPLASTY Left 08/28/2011  . TMJ ARTHROPLASTY  1982  . TOTAL HIP ARTHROPLASTY Left 2002  . TRIGGER FINGER RELEASE Right 05/15/2011   long finger   Family History  Problem Relation Age of Onset  . Aneurysm Father 62       brain  . Alcohol abuse Father   . Cancer Father        possibly  . CAD Other        several siblings  . Cancer Brother        prostate  .  Diabetes Brother   . Diabetes Sister   . Anesthesia problems Neg Hx   . Hypotension Neg Hx   . Malignant hyperthermia Neg Hx   . Pseudochol deficiency Neg Hx   . Breast cancer Neg Hx    Social History   Socioeconomic History  . Marital status: Married    Spouse name: Not on file  . Number of children: 3  . Years of education: Not on file  . Highest education level:  Not on file  Occupational History  . Occupation: Designer, industrial/product: UNEMPLOYED  Tobacco Use  . Smoking status: Former Smoker    Packs/day: 1.50    Years: 40.00    Pack years: 60.00    Types: Cigarettes    Quit date: 04/29/2009    Years since quitting: 10.7  . Smokeless tobacco: Never Used  Substance and Sexual Activity  . Alcohol use: Yes    Alcohol/week: 2.0 standard drinks    Types: 2 Shots of liquor per week    Comment: 2 shots/weekly  . Drug use: No  . Sexual activity: Not Currently    Comment: Hysterectomy  Other Topics Concern  . Not on file  Social History Narrative   Married   3 children   Social Determinants of Health   Financial Resource Strain: Low Risk   . Difficulty of Paying Living Expenses: Not hard at all  Food Insecurity: No Food Insecurity  . Worried About Programme researcher, broadcasting/film/video in the Last Year: Never true  . Ran Out of Food in the Last Year: Never true  Transportation Needs: No Transportation Needs  . Lack of Transportation (Medical): No  . Lack of Transportation (Non-Medical): No  Physical Activity: Inactive  . Days of Exercise per Week: 0 days  . Minutes of Exercise per Session: 0 min  Stress: Stress Concern Present  . Feeling of Stress : To some extent  Social Connections:   . Frequency of Communication with Friends and Family:   . Frequency of Social Gatherings with Friends and Family:   . Attends Religious Services:   . Active Member of Clubs or Organizations:   . Attends Banker Meetings:   Marland Kitchen Marital Status:     Outpatient Encounter Medications as of  01/20/2020  Medication Sig  . albuterol (PROVENTIL HFA;VENTOLIN HFA) 108 (90 Base) MCG/ACT inhaler Inhale 2 puffs into the lungs every 6 (six) hours as needed for wheezing or shortness of breath.  Marland Kitchen aspirin 81 MG EC tablet Take 81 mg by mouth at bedtime.   . budesonide-formoterol (SYMBICORT) 160-4.5 MCG/ACT inhaler Inhale 2 puffs into the lungs 2 (two) times daily.  . celecoxib (CELEBREX) 200 MG capsule Take 1 capsule (200 mg total) by mouth daily.  Marland Kitchen dicyclomine (BENTYL) 20 MG tablet Take 1 tablet (20 mg total) by mouth in the morning and at bedtime.  . docusate sodium (COLACE) 100 MG capsule Take 1 capsule (100 mg total) by mouth 2 (two) times daily.  Marland Kitchen doxycycline (VIBRA-TABS) 100 MG tablet Take 1 tablet (100 mg total) by mouth 2 (two) times daily.  . fluconazole (DIFLUCAN) 200 MG tablet Take 1 tablet (200 mg total) by mouth daily. X 5 days as needed for yeast infection  . FLUoxetine (PROZAC) 40 MG capsule Take by mouth daily.  . furosemide (LASIX) 20 MG tablet Take 1 tablet (20 mg total) by mouth daily as needed for edema.  Marland Kitchen lamoTRIgine (LAMICTAL) 200 MG tablet Take 200 mg by mouth daily.    Marland Kitchen levothyroxine (SYNTHROID) 150 MCG tablet Take 1 tablet (150 mcg total) by mouth at bedtime.  Marland Kitchen losartan (COZAAR) 50 MG tablet Take 1 tablet (50 mg total) by mouth daily.  . montelukast (SINGULAIR) 10 MG tablet Take 1 tablet (10 mg total) by mouth at bedtime.  . Multiple Vitamin (MULTIVITAMIN WITH MINERALS) TABS tablet Take 2 tablets by mouth daily.   . ondansetron (ZOFRAN ODT) 8 MG disintegrating tablet Take 1 tablet (8 mg total)  by mouth 2 (two) times daily as needed for nausea or vomiting.  . Oxcarbazepine (TRILEPTAL) 300 MG tablet Take 300-600 mg by mouth See admin instructions. Take 300 mg by mouth in the morning and 600 mg at night  . oxybutynin (DITROPAN-XL) 10 MG 24 hr tablet Take 1 tablet (10 mg total) by mouth daily.  . pantoprazole (PROTONIX) 40 MG tablet Take 1 tablet (40 mg total) by mouth 2  (two) times daily.  . polyethylene glycol (MIRALAX / GLYCOLAX) 17 g packet Take 17 g by mouth 2 (two) times daily as needed for mild constipation.  . prochlorperazine (COMPAZINE) 10 MG tablet Take 1 tablet (10 mg total) by mouth every 6 (six) hours as needed for nausea or vomiting.  . promethazine (PHENERGAN) 25 MG suppository Place 1 suppository (25 mg total) rectally every 6 (six) hours as needed for nausea or vomiting.  . simvastatin (ZOCOR) 40 MG tablet Take 1 tablet (40 mg total) by mouth every evening.  . sucralfate (CARAFATE) 1 GM/10ML suspension Take 10 mLs (1 g total) by mouth 4 (four) times daily -  with meals and at bedtime.  . zaleplon (SONATA) 10 MG capsule Take 10 mg by mouth at bedtime as needed for sleep.  . carvedilol (COREG) 6.25 MG tablet Take 1 tablet (6.25 mg total) by mouth 2 (two) times daily.   No facility-administered encounter medications on file as of 01/20/2020.    Activities of Daily Living In your present state of health, do you have any difficulty performing the following activities: 01/20/2020 11/15/2019  Hearing? Y N  Comment tv is loud sometimes -  Vision? N N  Difficulty concentrating or making decisions? N N  Walking or climbing stairs? N Y  Comment - back surgery  Dressing or bathing? N Y  Comment - due to back surgery  Doing errands, shopping? N Y  Comment - depend on husband and daughter  Conservation officer, nature and eating ? N N  Using the Toilet? N N  In the past six months, have you accidently leaked urine? Y N  Comment wears pads daily -  Do you have problems with loss of bowel control? N N  Managing your Medications? N N  Managing your Finances? N N  Housekeeping or managing your Housekeeping? N Y  Comment - depends on daughter  Some recent data might be hidden    Patient Care Team: Ria Bush, MD as PCP - General (Family Medicine) Minus Breeding, MD as PCP - Cardiology (Cardiology) Valente David, RN as [place]  Management    Assessment:   This is a routine wellness examination for [place].  Exercise Activities and Dietary recommendations Current Exercise Habits: The patient does not participate in regular exercise at present, Exercise limited by: None identified  Goals    . Increase physical activity     Weather permitting, I will continue to walk for 3000-7000 steps daily.     . Patient Stated     01/20/2020, I will maintain and continue medications as prescribed.        Fall Risk Fall Risk  01/20/2020 11/15/2019 08/04/2019 06/20/2019 04/20/2019  Falls in the past year? 1 1 0 1 1  Number falls in past yr: 1 0 - 0 1  Injury with Fall? 0 0 - 1 1  Risk for fall due to : Medication side effect;Impaired balance/gait History of fall(s) Impaired balance/gait;Impaired mobility History of fall(s);Impaired balance/gait -  Follow up Falls evaluation completed;Falls prevention discussed Falls  prevention discussed Falls evaluation completed Falls evaluation completed -   Is the patient's home free of loose throw rugs in walkways, pet beds, electrical cords, etc?   yes      Grab bars in the bathroom? yes      Handrails on the stairs?   yes      Adequate lighting?   yes  Timed Get Up and Go performed: N/A  Depression Screen PHQ 2/9 Scores 01/20/2020 11/15/2019 08/04/2019 04/20/2019  PHQ - 2 Score 2 1 1  0  PHQ- 9 Score 2 - - -     Cognitive Function MMSE - Mini Mental State Exam 01/20/2020 01/12/2019 01/05/2018  Not completed: - Unable to complete -  Orientation to time 5 - 5  Orientation to Place 5 - 5  Registration 3 - 3  Attention/ Calculation 5 - 0  Recall 3 - 3  Language- name 2 objects - - 0  Language- repeat 1 - 1  Language- follow 3 step command - - 3  Language- read & follow direction - - 0  Write a sentence - - 0  Copy design - - 0  Total score - - 20  Mini Cog  Mini-Cog screen was completed. Maximum score is 22. A value of 0 denotes this part of the MMSE was not completed or the patient  failed this part of the Mini-Cog screening.       Immunization History  Administered Date(s) Administered  . Influenza Split 08/15/2011, 08/30/2012  . Influenza Whole 07/24/2007, 07/19/2009, 06/25/2010  . Influenza,inj,Quad PF,6+ Mos 07/08/2016, 06/25/2017, 07/12/2018  . Influenza-Unspecified 05/05/2011, 06/20/2015  . Pneumococcal Conjugate-13 01/12/2018  . Pneumococcal Polysaccharide-23 08/30/2012  . Td 06/30/2007  . Zoster 12/29/2013    Qualifies for Shingles Vaccine: Yes  Screening Tests Health Maintenance  Topic Date Due  . DTAP VACCINES (1) 08/07/1951  . COVID-19 Vaccine (1) Never done  . DTaP/Tdap/Td (2 - Tdap) 06/29/2017  . TETANUS/TDAP  09/28/2020 (Originally 06/29/2017)  . PNA vac Low Risk Adult (2 of 2 - PPSV23) 01/19/2021 (Originally 01/13/2019)  . INFLUENZA VACCINE  04/29/2020  . HEMOGLOBIN A1C  05/08/2020  . MAMMOGRAM  08/09/2020  . FOOT EXAM  08/10/2020  . OPHTHALMOLOGY EXAM  09/08/2020  . COLONOSCOPY  12/30/2025  . DEXA SCAN  Completed  . Hepatitis C Screening  Completed    Cancer Screenings: Lung: Low Dose CT Chest recommended if Age 41-80 years, 30 pack-year currently smoking OR have quit w/in 15 years. Patient does not qualify. Breast:  Up to date on Mammogram: Yes, completed 08/10/2019  Up to date of Bone Density/Dexa: Yes, completed 08/09/2019 Colorectal: completed 12/31/2015  Additional Screenings:  Hepatitis C Screening: 07/01/2016     Plan:   Patient will maintain and continue medications as prescribed.    I have personally reviewed and noted the following in the patient's chart:   . Medical and social history . Use of alcohol, tobacco or illicit drugs  . Current medications and supplements . Functional ability and status . Nutritional status . Physical activity . Advanced directives . List of other physicians . Hospitalizations, surgeries, and ER visits in previous 12 months . Vitals . Screenings to include cognitive, depression, and  falls . Referrals and appointments  In addition, I have reviewed and discussed with patient certain preventive protocols, quality metrics, and best practice recommendations. A written personalized care plan for preventive services as well as general preventive health recommendations were provided to patient.     08/31/2016, LPN  01/20/2020     

## 2020-01-20 NOTE — Addendum Note (Signed)
Addended by: [name] on: 01/20/2020 01:21 PM   Modules accepted: Orders

## 2020-01-20 NOTE — Addendum Note (Signed)
Addended by: Aquilla Solian on: 01/20/2020 01:44 PM   Modules accepted: Orders

## 2020-01-21 ENCOUNTER — Other Ambulatory Visit (HOSPITAL_COMMUNITY)
Admission: RE | Admit: 2020-01-21 | Discharge: 2020-01-21 | Disposition: A | Discharge status: Home / Self Care | Payer: Medicare Other | Source: Ambulatory Visit | Attending: Cardiovascular Disease | Admitting: Cardiovascular Disease

## 2020-01-21 DIAGNOSIS — Z20822 Contact with and (suspected) exposure to covid-19: Secondary | ICD-10-CM | POA: Insufficient documentation

## 2020-01-21 DIAGNOSIS — Z01812 Encounter for preprocedural laboratory examination: Secondary | ICD-10-CM | POA: Diagnosis not present

## 2020-01-21 LAB — SARS CORONAVIRUS 2 (TAT 6-24 HRS): SARS Coronavirus 2: NEGATIVE

## 2020-01-23 ENCOUNTER — Other Ambulatory Visit: Payer: Self-pay

## 2020-01-23 ENCOUNTER — Ambulatory Visit (HOSPITAL_BASED_OUTPATIENT_CLINIC_OR_DEPARTMENT_OTHER): Payer: Medicare Other | Attending: Adult Health | Admitting: Cardiovascular Disease

## 2020-01-23 DIAGNOSIS — R0683 Snoring: Secondary | ICD-10-CM | POA: Diagnosis not present

## 2020-01-23 DIAGNOSIS — G4733 Obstructive sleep apnea (adult) (pediatric): Secondary | ICD-10-CM

## 2020-01-23 DIAGNOSIS — Z6841 Body Mass Index (BMI) 40.0 and over, adult: Secondary | ICD-10-CM | POA: Diagnosis not present

## 2020-01-23 DIAGNOSIS — I429 Cardiomyopathy, unspecified: Secondary | ICD-10-CM | POA: Insufficient documentation

## 2020-01-23 DIAGNOSIS — Z9989 Dependence on other enabling machines and devices: Secondary | ICD-10-CM | POA: Diagnosis not present

## 2020-01-25 DIAGNOSIS — F3181 Bipolar II disorder: Secondary | ICD-10-CM | POA: Diagnosis not present

## 2020-01-27 ENCOUNTER — Other Ambulatory Visit: Payer: Self-pay

## 2020-01-27 ENCOUNTER — Encounter: Payer: Self-pay | Admitting: Family Medicine

## 2020-01-27 ENCOUNTER — Ambulatory Visit (INDEPENDENT_AMBULATORY_CARE_PROVIDER_SITE_OTHER): Payer: Medicare Other | Admitting: Family Medicine

## 2020-01-27 VITALS — BP 128/76 | HR 73 | Temp 97.7°F | Ht 63.0 in | Wt 380.2 lb

## 2020-01-27 DIAGNOSIS — R32 Unspecified urinary incontinence: Secondary | ICD-10-CM | POA: Diagnosis not present

## 2020-01-27 DIAGNOSIS — E1169 Type 2 diabetes mellitus with other specified complication: Secondary | ICD-10-CM | POA: Diagnosis not present

## 2020-01-27 DIAGNOSIS — I2510 Atherosclerotic heart disease of native coronary artery without angina pectoris: Secondary | ICD-10-CM

## 2020-01-27 DIAGNOSIS — E039 Hypothyroidism, unspecified: Secondary | ICD-10-CM

## 2020-01-27 DIAGNOSIS — E559 Vitamin D deficiency, unspecified: Secondary | ICD-10-CM

## 2020-01-27 DIAGNOSIS — M159 Polyosteoarthritis, unspecified: Secondary | ICD-10-CM

## 2020-01-27 DIAGNOSIS — J432 Centrilobular emphysema: Secondary | ICD-10-CM | POA: Diagnosis not present

## 2020-01-27 DIAGNOSIS — D649 Anemia, unspecified: Secondary | ICD-10-CM | POA: Diagnosis not present

## 2020-01-27 DIAGNOSIS — I428 Other cardiomyopathies: Secondary | ICD-10-CM

## 2020-01-27 DIAGNOSIS — E785 Hyperlipidemia, unspecified: Secondary | ICD-10-CM

## 2020-01-27 DIAGNOSIS — Z23 Encounter for immunization: Secondary | ICD-10-CM

## 2020-01-27 DIAGNOSIS — G4733 Obstructive sleep apnea (adult) (pediatric): Secondary | ICD-10-CM

## 2020-01-27 DIAGNOSIS — T847XXD Infection and inflammatory reaction due to other internal orthopedic prosthetic devices, implants and grafts, subsequent encounter: Secondary | ICD-10-CM

## 2020-01-27 DIAGNOSIS — Z6841 Body Mass Index (BMI) 40.0 and over, adult: Secondary | ICD-10-CM

## 2020-01-27 DIAGNOSIS — I5021 Acute systolic (congestive) heart failure: Secondary | ICD-10-CM

## 2020-01-27 DIAGNOSIS — M85851 Other specified disorders of bone density and structure, right thigh: Secondary | ICD-10-CM

## 2020-01-27 DIAGNOSIS — M8949 Other hypertrophic osteoarthropathy, multiple sites: Secondary | ICD-10-CM | POA: Diagnosis not present

## 2020-01-27 DIAGNOSIS — E1142 Type 2 diabetes mellitus with diabetic polyneuropathy: Secondary | ICD-10-CM

## 2020-01-27 DIAGNOSIS — F3170 Bipolar disorder, currently in remission, most recent episode unspecified: Secondary | ICD-10-CM | POA: Diagnosis not present

## 2020-01-27 DIAGNOSIS — I700 Atherosclerosis of aorta: Secondary | ICD-10-CM

## 2020-01-27 DIAGNOSIS — Z9989 Dependence on other enabling machines and devices: Secondary | ICD-10-CM

## 2020-01-27 MED ORDER — METFORMIN HCL 500 MG PO TABS
500.0000 mg | ORAL_TABLET | Freq: Every evening | ORAL | 11 refills | Status: DC
Start: 2020-01-27 — End: 2020-06-15

## 2020-01-27 NOTE — Assessment & Plan Note (Signed)
Thyroid levels stable - continue levothyroxine daily

## 2020-01-27 NOTE — Patient Instructions (Addendum)
Consider vitamin D3 1000 units daily supplementation, especially during winter months.  Final pneumovax today.  If interested, check with pharmacy about new 2 shot shingles series (shingrix).  Ok to restart metformin 500mg  in evenings. Return as needed or in 6 months for diabetes follow up  Health Maintenance After Age 68 After age 74, you are at a higher risk for certain long-term diseases and infections as well as injuries from falls. Falls are a major cause of broken bones and head injuries in people who are older than age 33. Getting regular preventive care can help to keep you healthy and well. Preventive care includes getting regular testing and making lifestyle changes as recommended by your health care provider. Talk with your health care provider about:  Which screenings and tests you should have. A screening is a test that checks for a disease when you have no symptoms.  A diet and exercise plan that is right for you. What should I know about screenings and tests to prevent falls? Screening and testing are the best ways to find a health problem early. Early diagnosis and treatment give you the best chance of managing medical conditions that are common after age 59. Certain conditions and lifestyle choices may make you more likely to have a fall. Your health care provider may recommend:  Regular vision checks. Poor vision and conditions such as cataracts can make you more likely to have a fall. If you wear glasses, make sure to get your prescription updated if your vision changes.  Medicine review. Work with your health care provider to regularly review all of the medicines you are taking, including over-the-counter medicines. Ask your health care provider about any side effects that may make you more likely to have a fall. Tell your health care provider if any medicines that you take make you feel dizzy or sleepy.  Osteoporosis screening. Osteoporosis is a condition that causes the bones  to get weaker. This can make the bones weak and cause them to break more easily.  Blood pressure screening. Blood pressure changes and medicines to control blood pressure can make you feel dizzy.  Strength and balance checks. Your health care provider may recommend certain tests to check your strength and balance while standing, walking, or changing positions.  Foot health exam. Foot pain and numbness, as well as not wearing proper footwear, can make you more likely to have a fall.  Depression screening. You may be more likely to have a fall if you have a fear of falling, feel emotionally low, or feel unable to do activities that you used to do.  Alcohol use screening. Using too much alcohol can affect your balance and may make you more likely to have a fall. What actions can I take to lower my risk of falls? General instructions  Talk with your health care provider about your risks for falling. Tell your health care provider if: ? You fall. Be sure to tell your health care provider about all falls, even ones that seem minor. ? You feel dizzy, sleepy, or off-balance.  Take over-the-counter and prescription medicines only as told by your health care provider. These include any supplements.  Eat a healthy diet and maintain a healthy weight. A healthy diet includes low-fat dairy products, low-fat (lean) meats, and fiber from whole grains, beans, and lots of fruits and vegetables. Home safety  Remove any tripping hazards, such as rugs, cords, and clutter.  Install safety equipment such as grab bars in bathrooms and  safety rails on stairs.  Keep rooms and walkways well-lit. Activity   Follow a regular exercise program to stay fit. This will help you maintain your balance. Ask your health care provider what types of exercise are appropriate for you.  If you need a cane or walker, use it as recommended by your health care provider.  Wear supportive shoes that have nonskid  soles. Lifestyle  Do not drink alcohol if your health care provider tells you not to drink.  If you drink alcohol, limit how much you have: ? 0-1 drink a day for women. ? 0-2 drinks a day for men.  Be aware of how much alcohol is in your drink. In the [place] one drink equals one typical bottle of beer (12 oz), one-half glass of wine (5 oz), or one shot of hard liquor (1 oz).  Do not use any products that contain nicotine or tobacco, such as cigarettes and e-cigarettes. If you need help quitting, ask your health care provider. Summary  Having a healthy lifestyle and getting preventive care can help to protect your health and wellness after age 11.  Screening and testing are the best way to find a health problem early and help you avoid having a fall. Early diagnosis and treatment give you the best chance for managing medical conditions that are more common for people who are older than age 70.  Falls are a major cause of broken bones and head injuries in people who are older than age 35. Take precautions to prevent a fall at home.  Work with your health care provider to learn what changes you can make to improve your health and wellness and to prevent falls. This information is not intended to replace advice given to you by your health care provider. Make sure you discuss any questions you have with your health care provider. Document Revised: 01/06/2019 Document Reviewed: 07/29/2017 Elsevier Patient Education  2020 [employer].

## 2020-01-27 NOTE — Assessment & Plan Note (Addendum)
Reviewed latest DEXA along with calcium in diet and vitamin D dosing.

## 2020-01-27 NOTE — Assessment & Plan Note (Signed)
New normocytic anemia that has developed since latest hospitalization 10/2019. Will need updated iron panel next labs.

## 2020-01-27 NOTE — Progress Notes (Signed)
This visit was conducted in person.  BP 128/76 (BP Location: Left Arm, Patient Position: Sitting, Cuff Size: Large) Comment (Cuff Size): Thigh  Pulse 73   Temp 97.7 F (36.5 C) (Temporal)   Ht 5\' 3"  (1.6 m)   Wt (!) 380 lb 3 oz (172.5 kg)   LMP  (LMP Unknown)   SpO2 93%   BMI 67.35 kg/m    CC: CPE Subjective:    Patient ID: , female    DOB: 1951-08-02, 69 y.o.   MRN: [id number]  HPI: Amy Barnes is a 69 y.o. female presenting on 01/27/2020 for Annual Exam (Prt 2.  Pt accompanied by daughter, 01/29/2020- temp 97.9.)   Saw health advisor last week for medicare wellness visit. Note reviewed.    No exam data present    Clinical Support from 01/20/2020 in [facility] at [facility] Total Score  2      Fall Risk  01/20/2020 11/15/2019 08/04/2019 06/20/2019 04/20/2019  Falls in the past year? 1 1 0 1 1  Number falls in past yr: 1 0 - 0 1  Injury with Fall? 0 0 - 1 1  Risk for fall due to : Medication side effect;Impaired balance/gait History of fall(s) Impaired balance/gait;Impaired mobility History of fall(s);Impaired balance/gait -  Follow up Falls evaluation completed;Falls prevention discussed Falls prevention discussed Falls evaluation completed Falls evaluation completed -  2 falls in the past year - first one before back surgery last summer, second fall   Recent hospitalization 10/2019 for intractable nausea/vomiting/abd pain found to have non-STEMI with acute sCHF exacerbation treated with carvedilol, entresto (expensive) and lasix (new EF 35-40%). She had reassuring non-obstructive CAC on catheterization as well as severe MV calcification without stenosis. She also had reassuring EGD, HIDA scan. CT abd/pelvis and gastric emptying study unrevealing. Started on bentyl 20mg  BID. Entresto changed to losartan 50mg  due to unaffordability. Goal BP <130/80.   Outpatient sleep study done 4/26 - pending   DM - cbg's at home staying 150s fasting - higher than  previously. Previously on metformin.  Lab Results  Component Value Date   HGBA1C 7.1 (H) 11/09/2019     MRSA lumbar HW infection - followed by ID - planning to continue doxy 100mg  bid for extended period. Using fluconazole PRN yeast infections.   Preventative: COLONOSCOPY WITH PROPOFOL 12/31/2015;diverticulosis, int hem, o/w normal rpt 10 yrs ([facility]).  Lung cancer screening -ex smoker, quit 2010. ~40 PY hx. Undergoing LRCT scans - last 08/2019. Breast cancer screening -07/2019 BiRads1 Well woman exam -h/o partial hysterectomy for menorrhagia, ovaries remain. No pelvic pain/pressure or bleeding. DEXA 07/2019 - T -1.1 R femur (osteopenia). Discussed calcium in diet. Discussed vit D.  Flu shot -yearly  Td-2008  Pneumovax 2013, prevnar 2019. Due for final pneumovax  COVID vaccine - declines Zostavax-2015  shingrix - discussed - to check with insurance.  Advanced directive: scanned 05/2017. Husband 2014 is HCPOA. Desires very limited CPR attempts, does not want any prolonged life suppory Seat belt use discussed Sunscreen usediscussed. No changing moles on skin. 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 Interim medical history since our last visit reviewed. Allergies and medications reviewed and updated. Outpatient Medications Prior to Visit  Medication Sig Dispense Refill  . albuterol (PROVENTIL HFA;VENTOLIN HFA) 108 (90 Base) MCG/ACT inhaler Inhale 2 puffs into the lungs every 6 (six) hours as needed for wheezing or shortness of breath. 1 Inhaler 6  . aspirin 81 MG EC tablet  Take 81 mg by mouth at bedtime.     . budesonide-formoterol (SYMBICORT) 160-4.5 MCG/ACT inhaler Inhale 2 puffs into the lungs 2 (two) times daily. 1 Inhaler 3  . celecoxib (CELEBREX) 200 MG capsule Take 1 capsule (200 mg total) by mouth daily. 90 capsule 1  . dicyclomine (BENTYL) 20 MG tablet Take 1 tablet (20 mg total) by mouth in the morning and at bedtime. 60 tablet 2  . docusate sodium (COLACE) 100 MG capsule Take 1 capsule (100 mg total) by mouth 2 (two) times daily. 10 capsule 0  . doxycycline (VIBRA-TABS) 100 MG tablet Take 1 tablet (100 mg total) by mouth 2 (two) times daily. 180 tablet 3  . fluconazole (DIFLUCAN) 200 MG tablet Take 1 tablet (200 mg total) by mouth daily. X 5 days as needed for yeast infection 30 tablet 0  . FLUoxetine (PROZAC) 40 MG capsule Take by mouth daily.    . furosemide (LASIX) 20 MG tablet Take 1 tablet (20 mg total) by mouth daily as needed for edema. 30 tablet 0  . lamoTRIgine (LAMICTAL) 200 MG tablet Take 200 mg by mouth daily.      Marland Kitchen levothyroxine (SYNTHROID) 150 MCG tablet Take 1 tablet (150 mcg total) by mouth at bedtime. 90 tablet 3  . losartan (COZAAR) 50 MG tablet Take 1 tablet (50 mg total) by mouth daily. 90 tablet 1  . montelukast (SINGULAIR) 10 MG tablet Take 1 tablet (10 mg total) by mouth at bedtime. 90 tablet 3  . Multiple Vitamin (MULTIVITAMIN WITH MINERALS) TABS tablet Take 2 tablets by mouth daily.     . ondansetron (ZOFRAN ODT) 8 MG disintegrating tablet Take 1 tablet (8 mg total) by mouth 2 (two) times daily as needed for nausea or vomiting. 10 tablet 0  . Oxcarbazepine (TRILEPTAL) 300 MG tablet Take 300-600 mg by mouth See admin instructions. Take 300 mg by mouth in the morning and 600 mg at night    . oxybutynin (DITROPAN-XL) 10 MG 24 hr tablet Take 1 tablet (10 mg total) by mouth daily. 30 tablet 11  . pantoprazole (PROTONIX) 40 MG tablet Take 1 tablet (40 mg total) by mouth 2 (two) times daily. 180 tablet 1  . polyethylene glycol (MIRALAX /  GLYCOLAX) 17 g packet Take 17 g by mouth 2 (two) times daily as needed for mild constipation. 14 each 0  . prochlorperazine (COMPAZINE) 10 MG tablet Take 1 tablet (10 mg total) by mouth every 6 (six) hours as needed for nausea or vomiting. 30 tablet 1  . promethazine (PHENERGAN) 25 MG suppository Place 1 suppository (25 mg total) rectally every 6 (six) hours as needed for nausea or vomiting. 12 each 0  . simvastatin (ZOCOR) 40 MG tablet Take 1 tablet (40 mg total) by mouth every evening. 90 tablet 1  . sucralfate (CARAFATE) 1 GM/10ML suspension Take 10 mLs (1 g total) by mouth 4 (four) times daily -  with meals and at bedtime. 420 mL 0  . zaleplon (SONATA) 10 MG capsule Take 10 mg by mouth at bedtime as needed for sleep.    . carvedilol (COREG)  6.25 MG tablet Take 1 tablet (6.25 mg total) by mouth 2 (two) times daily. 60 tablet 5   No facility-administered medications prior to visit.     Per HPI unless specifically indicated in ROS section below Review of Systems Objective:  BP 128/76 (BP Location: Left Arm, Patient Position: Sitting, Cuff Size: Large) Comment (Cuff Size): Thigh  Pulse 73   Temp 97.7 F (36.5 C) (Temporal)   Ht 5\' 3"  (1.6 m)   Wt (!) 380 lb 3 oz (172.5 kg)   LMP  (LMP Unknown)   SpO2 93%   BMI 67.35 kg/m   Wt Readings from Last 3 Encounters:  01/27/20 (!) 380 lb 3 oz (172.5 kg)  01/23/20 (!) 375 lb (170.1 kg)  01/20/20 (!) 368 lb (166.9 kg)      Physical Exam Vitals and nursing note reviewed.  Constitutional:      General: She is not in acute distress.    Appearance: She is well-developed. She is morbidly obese.  HENT:     Head: Normocephalic and atraumatic.     Right Ear: Hearing, tympanic membrane, ear canal and external ear normal.     Left Ear: Hearing, tympanic membrane, ear canal and external ear normal.     Mouth/Throat:     Mouth: Mucous membranes are pale.  Eyes:     General: No scleral icterus.    Extraocular Movements: Extraocular movements  intact.     Conjunctiva/sclera: Conjunctivae normal.     Pupils: Pupils are equal, round, and reactive to light.  Neck:     Thyroid: No thyromegaly or thyroid tenderness.  Cardiovascular:     Rate and Rhythm: Normal rate and regular rhythm.     Pulses: Normal pulses.          Radial pulses are 2+ on the right side and 2+ on the left side.     Heart sounds: Normal heart sounds. No murmur.  Pulmonary:     Effort: Pulmonary effort is normal. No respiratory distress.     Breath sounds: Normal breath sounds. No wheezing, rhonchi or rales.  Abdominal:     General: Bowel sounds are normal. There is no distension.     Palpations: Abdomen is soft. There is no mass.     Tenderness: There is no abdominal tenderness. There is no guarding or rebound.     Hernia: No hernia is present.  Musculoskeletal:        General: Normal range of motion.     Cervical back: Normal range of motion and neck supple.     Right lower leg: No edema.     Left lower leg: No edema.  Lymphadenopathy:     Cervical: No cervical adenopathy.  Skin:    General: Skin is warm and dry.     Findings: No rash.  Neurological:     General: No focal deficit present.     Mental Status: She is alert.     Comments: CN grossly intact, station and gait intact  Psychiatric:        Mood and Affect: Mood normal.        Behavior: Behavior normal.        Thought Content: Thought content normal.        Judgment: Judgment normal.       Results for orders placed or performed during the hospital encounter of 01/21/20  SARS CORONAVIRUS 2 (TAT 6-24 HRS) Nasopharyngeal Nasopharyngeal Swab   Specimen: Nasopharyngeal Swab  Result Value Ref Range  SARS Coronavirus 2 NEGATIVE NEGATIVE   Assessment & Plan:  This visit occurred during the SARS-CoV-2 public health emergency.  Safety protocols were in place, including screening questions prior to the visit, additional usage of staff PPE, and extensive cleaning of exam room while observing  appropriate contact time as indicated for disinfecting solutions.   Problem List Items Addressed This Visit    Wound infection complicating hardware (HCC)    Regularly sees ID, planning long-term doxy treatment.       Vitamin D insufficiency    Discussed vitamin D dosing in setting of osteopenia      Urinary incontinence (Chronic)    Chronic, ongoing. Bad reaction to Gala Murdoch this year (intractable nausea/vomiting). She has oxybutynin at home to use (has not recently)      Thoracic aortic atherosclerosis (HCC)    Continues aspirin, statin.       Osteopenia    Reviewed latest DEXA along with calcium in diet and vitamin D dosing.       Osteoarthritis   OSA on CPAP    Recent sleep study inconclusive per pt report.       Normocytic anemia    New normocytic anemia that has developed since latest hospitalization 10/2019. Will need updated iron panel next labs.       Non-ischemic cardiomyopathy (HCC)   Morbid obesity with BMI of 60.0-69.9, adult (HCC)   Relevant Medications   metFORMIN (GLUCOPHAGE) 500 MG tablet   Hypothyroidism    Thyroid levels stable - continue levothyroxine daily      Hyperlipidemia associated with type 2 diabetes mellitus (HCC)    Chronic, stable on simvastatin - continue The ASCVD Risk score Denman George DC Jr., et al., 2013) failed to calculate for the following reasons:   The patient has a prior MI or stroke diagnosis       Relevant Medications   metFORMIN (GLUCOPHAGE) 500 MG tablet   DM type 2 with diabetic peripheral neuropathy (HCC)    Some elevated fasting readings recently - discussed impaired fasting sugars and overcompensation of gluconeogenesis by liver with prolonged fast. Suggested night time snack as well as restarting metformin 500mg  at night time to help those morning readings.       Relevant Medications   metFORMIN (GLUCOPHAGE) 500 MG tablet   Centrilobular emphysema (HCC)    She is now on symbicort.       CAD (coronary artery  disease)    Non-obstructive CAD by recent catheterization.       Bipolar disorder (HCC)    Continues mood stabilizers (lamictal and oxcarbazepine) through psych.       Acute HFrEF (heart failure with reduced ejection fraction) (HCC)    Nonischemic, seeing cardiology. Planned f/u echo next week. Worried about reaction to entresto so now back on losartan.       Abdominal aortic atherosclerosis (HCC)    Continue aspirin, statin.        Other Visit Diagnoses    Need for 23-polyvalent pneumococcal polysaccharide vaccine    -  Primary   Relevant Orders   Pneumococcal polysaccharide vaccine 23-valent greater than or equal to 2yo subcutaneous/IM (Completed)       Meds ordered this encounter  Medications  . metFORMIN (GLUCOPHAGE) 500 MG tablet    Sig: Take 1 tablet (500 mg total) by mouth every evening.    Dispense:  30 tablet    Refill:  11   Orders Placed This Encounter  Procedures  . Pneumococcal polysaccharide vaccine 23-valent  greater than or equal to 2yo subcutaneous/IM    Patient instructions: Consider vitamin D3 1000 units daily supplementation, especially during winter months.  Final pneumovax today.  If interested, check with pharmacy about new 2 shot shingles series (shingrix).  Ok to restart metformin 500mg  in evenings. Return as needed or in 6 months for diabetes follow up  Follow up plan: Return in about 6 months (around 07/28/2020) for follow up visit.  07/30/2020, MD

## 2020-01-27 NOTE — Assessment & Plan Note (Addendum)
Recent sleep study inconclusive per pt report.

## 2020-01-27 NOTE — Assessment & Plan Note (Signed)
Some elevated fasting readings recently - discussed impaired fasting sugars and overcompensation of gluconeogenesis by liver with prolonged fast. Suggested night time snack as well as restarting metformin 500mg  at night time to help those morning readings.

## 2020-01-27 NOTE — Assessment & Plan Note (Signed)
She is now on symbicort.

## 2020-01-27 NOTE — Assessment & Plan Note (Signed)
Non-obstructive CAD by recent catheterization.

## 2020-01-27 NOTE — Assessment & Plan Note (Addendum)
Chronic, ongoing. Bad reaction to Gala Murdoch this year (intractable nausea/vomiting). She has oxybutynin at home to use (has not recently)

## 2020-01-27 NOTE — Assessment & Plan Note (Signed)
Nonischemic, seeing cardiology. Planned f/u echo next week. Worried about reaction to entresto so now back on losartan.

## 2020-01-27 NOTE — Assessment & Plan Note (Signed)
Continues aspirin, statin.  

## 2020-01-27 NOTE — Assessment & Plan Note (Signed)
Continue aspirin, statin.  

## 2020-01-27 NOTE — Assessment & Plan Note (Signed)
Discussed vitamin D dosing in setting of osteopenia

## 2020-01-27 NOTE — Assessment & Plan Note (Signed)
Regularly sees ID, planning long-term doxy treatment.

## 2020-01-27 NOTE — Assessment & Plan Note (Addendum)
Continues mood stabilizers (lamictal and oxcarbazepine) through psych.

## 2020-01-27 NOTE — Assessment & Plan Note (Signed)
Chronic, stable on simvastatin - continue. The ASCVD Risk score (Goff DC Jr., et al., 2013) failed to calculate for the following reasons:   The patient has a prior MI or stroke diagnosis  

## 2020-01-28 ENCOUNTER — Encounter (HOSPITAL_BASED_OUTPATIENT_CLINIC_OR_DEPARTMENT_OTHER): Payer: Self-pay | Admitting: Cardiovascular Disease

## 2020-01-28 NOTE — Procedures (Signed)
Patient Name: Amy Barnes, Amy Barnes Date: 01/23/2020 Gender: Female D.O.B: Jan 29, 1951 Age (years): 68 Referring Provider: Joni Reining NP Height (inches): 64 Interpreting Physician: Nicki Guadalajara MD, ABSM Weight (lbs): 375 RPSGT: Rosette Reveal BMI: 64 MRN: [id number] Neck Size: 17.00  CLINICAL INFORMATION Sleep Study Type: NPSG  Indication for sleep study: Obesity, Snoring  Epworth Sleepiness Score: 15  SLEEP STUDY TECHNIQUE As per the AASM Manual for the Scoring of Sleep and Associated Events v2.3 (April 2016) with a hypopnea requiring 4% desaturations.  The channels recorded and monitored were frontal, central and occipital EEG, electrooculogram (EOG), submentalis EMG (chin), nasal and oral airflow, thoracic and abdominal wall motion, anterior tibialis EMG, snore microphone, electrocardiogram, and pulse oximetry.  MEDICATIONS albuterol (PROVENTIL HFA;VENTOLIN HFA) 108 (90 Base) MCG/ACT inhaler  aspirin 81 MG EC tablet  budesonide-formoterol (SYMBICORT) 160-4.5 MCG/ACT inhaler  carvedilol (COREG) 6.25 MG tablet(Expired)  celecoxib (CELEBREX) 200 MG capsule  dicyclomine (BENTYL) 20 MG tablet  docusate sodium (COLACE) 100 MG capsule  doxycycline (VIBRA-TABS) 100 MG tablet  fluconazole (DIFLUCAN) 200 MG tablet  FLUoxetine (PROZAC) 40 MG capsule  furosemide (LASIX) 20 MG tablet  lamoTRIgine (LAMICTAL) 200 MG tablet  levothyroxine (SYNTHROID) 150 MCG tablet  losartan (COZAAR) 50 MG tablet  metFORMIN (GLUCOPHAGE) 500 MG tablet  montelukast (SINGULAIR) 10 MG tablet  Multiple Vitamin (MULTIVITAMIN WITH MINERALS) TABS tablet  ondansetron (ZOFRAN ODT) 8 MG disintegrating tablet  Oxcarbazepine (TRILEPTAL) 300 MG tablet  oxybutynin (DITROPAN-XL) 10 MG 24 hr tablet  pantoprazole (PROTONIX) 40 MG tablet  polyethylene glycol (MIRALAX / GLYCOLAX) 17 g packet  prochlorperazine (COMPAZINE) 10 MG tablet  promethazine (PHENERGAN) 25 MG suppository  simvastatin (ZOCOR) 40  MG tablet  sucralfate (CARAFATE) 1 GM/10ML suspension  zaleplon (SONATA) 10 MG capsule  Medications self-administered by patient taken the night of the study : N/A  SLEEP ARCHITECTURE The study was initiated at 11:03:59 PM and ended at 5:05:32 AM.  Sleep onset time was 13.9 minutes and the sleep efficiency was 82.0%%. The total sleep time was 296.5 minutes.  Stage REM latency was 188.0 minutes.  The patient spent 4.7%% of the night in stage N1 sleep, 81.6%% in stage N2 sleep, 0.0%% in stage N3 and 13.7% in REM.  Alpha intrusion was absent.  Supine sleep was 100.00%.  RESPIRATORY PARAMETERS The overall apnea/hypopnea index (AHI) was 20.6 per hour.  The respiratory index (RDI) was 22.1/h. There were 0 total apneas, including 0 obstructive, 0 central and 0 mixed apneas. There were 102 hypopneas and 7 RERAs.  The AHI during Stage REM sleep was 59.3 per hour.  AHI while supine was 20.6 per hour.  The mean oxygen saturation was 84.6%. The minimum SpO2 during sleep was 67.0%.  Soft snoring was noted during this study.  CARDIAC DATA The 2 lead EKG demonstrated sinus rhythm. The mean heart rate was 80.6 beats per minute. Other EKG findings include: None.  LEG MOVEMENT DATA The total PLMS were 0 with a resulting PLMS index of 0.0. Associated arousal with leg movement index was 0.0 .  IMPRESSIONS - Moderate obstructive sleep apnea overall (AHI 20.6/h; RDI 22.1/h); however, events were severe during REM sleep (AHI 59.3/h). - No significant central sleep apnea occurred during this study (CAI = 0.0/h). - Severe oxygen desaturation to a nadir of 67%. Time spent < 89% was 292.9 minutes. - Abnormal sleep architecture with absent slow wave sleep and prolonged latency to REM sleep. - The patient snored with soft snoring volume. - No cardiac abnormalities were  noted during this study. - Clinically significant periodic limb movements did not occur during sleep. No significant associated  arousals.  DIAGNOSIS - Obstructive Sleep Apnea (327.23 [G47.33 ICD-10]) - Nocturnal Hypoxemia (327.26 [G47.36 ICD-10])  RECOMMENDATIONS - Therapeutic CPAP titration to determine optimal pressure required to alleviate sleep disordered breathing. - Effort should be made to optimize nasal nand oropharyngeal patency. - Positional therapy avoiding supine position during sleep. - Avoid alcohol, sedatives and other CNS depressants that may worsen sleep apnea and disrupt normal sleep architecture. - Sleep hygiene should be reviewed to assess factors that may improve sleep quality. - Weight management (BMI 64) and regular exercise should be initiated or continued if appropriate.  [Electronically signed] 01/28/2020 02:30 PM  Shelva Majestic MD, [place], American Board of Sleep Medicine   NPI: [id number]  [place] PH: [phone]   FX: [phone] [place]

## 2020-01-30 ENCOUNTER — Telehealth: Payer: Self-pay | Admitting: Cardiology

## 2020-01-30 ENCOUNTER — Other Ambulatory Visit: Payer: Self-pay

## 2020-01-30 ENCOUNTER — Encounter (HOSPITAL_COMMUNITY): Payer: Self-pay | Admitting: Cardiology

## 2020-01-30 ENCOUNTER — Ambulatory Visit (HOSPITAL_COMMUNITY): Payer: Medicare Other

## 2020-01-30 NOTE — Progress Notes (Signed)
Patient ID: Amy Barnes, female   DOB: 1951/02/18, 69 y.o.   MRN: [id number] Last night the echo dept was informed of a safety concern the FDA has with the usage of Definity, our lab is possibly going to change over to the usage of Optison as a result per this communication. My patient today will need a contrast agent used to acquire her images and this study is a 3 month follow up to be done in May, per [facility]'s note. I conferred with 2 other echo techs to gauge how they were approaching using Definity while things are uncertain and they both stated they are not going to use it. I spoke to the patient and informed her that we would like to reschedule to later this month when we can use a contrast agent to get the best information. I did not share the concerns about Definity, only stated that we are transitioning. Patient was very understanding and I helped her to reschedule to 5/24. I then attempted to contact [facility] as the patient told me that she had an appt with him this Friday, possibly to review results of today's scheduled test, which has now been rescheduled. Was unable to reach Luke's scheduler Virl Cagey) by phone, or leave message, so I called the [facility] office and explained situation, and left phone encounter for Dr Antoine Poche (who 5/7 appt is actually with) and his nurse Dorathy Daft. They have been provided with my number and I am awaiting their direction on how to proceed.

## 2020-01-30 NOTE — Telephone Encounter (Signed)
Echo tech called today. The Echo team was not able to do the echo on the patient due to a recent change in contrast usage. Echo tech wants to know if appt with Dr. Antoine Poche needs to be rescheduled as well. Please call Will the Echo Tech to discuss

## 2020-01-30 NOTE — Telephone Encounter (Signed)
Spoke with Will. Echo rescheduled for 5/23.   Spoke with patient. Appointment with Dr. Antoine Poche moved from 5/7 to 5/27 so that results of echo may be discussed at that time.   While on the phone patient requested refill of Lasix. Per chart 30 tablets given by Dr. Sharen Hones on 4/16. Patient reports PCP filled prescription due to patient being unable to reach cardiologist office for refill. Chart review does not show cardiology filling lasix in the past. Will route to Dr. Antoine Poche to see if he wants to fill furosemide.

## 2020-02-03 ENCOUNTER — Ambulatory Visit: Payer: Medicare Other | Admitting: Cardiology

## 2020-02-07 ENCOUNTER — Other Ambulatory Visit: Payer: Self-pay | Admitting: *Deleted

## 2020-02-07 NOTE — Patient Outreach (Signed)
[facility]) Care Management  02/07/2020  CLESSIE KARRAS 1951-01-08 [id number]   Call placed to member with plan to transition to health coach for chronic disease management.  She report she has been doing "alright" but is still having some shortness of breath with activity.  She was seen by her PCP on 4/30, no major concerns noted, will follow up in 6 months.  However she does have echo scheduled within the next 2 weeks as well as follow up with cardiology.  She is hoping to pinpoint the reason for the difficulty breathing.  Denies any chest pain or discomfort.  She is using her inhalers as instructed, report rest is what improves it the most.  She state she is taking medications as instructed as well as monitoring weights daily, denies any major changes and will continue to monitor.  Denies any urgent concerns, advised to contact this care manager with questions.  Will not transition to health coach this month, will follow up within the next month.  [facility] CM Care Plan Problem One     Most Recent Value  Care Plan Problem One  Risk for hospitalization related to HF management as evidenced by reported symptoms  Role Documenting the Problem One  Care Management Coordinator  Care Plan for Problem One  Active  [facility] Long Term Goal   Member will not have any HF related admissions to hospital within the next 31 days  [facility] Long Term Goal Start Date  02/07/20  Inter[facility] for Problem One Long Term Goal  Reviewed HF management with member, incluiding action plan/zones  [facility] CM Short Term Goal #1   Member will report completing test for Echo and cardiology appointment within the next 3 weeks  [facility] CM Short Term Goal #1 Start Date  02/07/20  Interventions for Short Term Goal #1  Reviewed upcoming appointments with member, assessed the need for transportation.  Advised of importance of keeping and attending these appointments  [facility] CM Short Term Goal #2   Member will report decrease in episodes of  shortness of breath over the next 2 weeks  [facility] CM Short Term Goal #2 Start Date  02/07/20  Interventions for Short Term Goal #2  Discussed with member interventions for shortness of breath, including rest and use of inhalers     Kemper Durie, RN, MSN [facility] Care Management  [facility] Care Manager [phone]

## 2020-02-08 MED ORDER — FUROSEMIDE 20 MG PO TABS: 20.0000 mg | ORAL_TABLET | Freq: Every day | ORAL | 1 refills | Status: DC | PRN

## 2020-02-20 ENCOUNTER — Other Ambulatory Visit: Payer: Self-pay

## 2020-02-20 ENCOUNTER — Ambulatory Visit (HOSPITAL_COMMUNITY): Payer: Medicare Other | Attending: Cardiology

## 2020-02-20 DIAGNOSIS — I2510 Atherosclerotic heart disease of native coronary artery without angina pectoris: Secondary | ICD-10-CM | POA: Insufficient documentation

## 2020-02-20 DIAGNOSIS — I428 Other cardiomyopathies: Secondary | ICD-10-CM

## 2020-02-20 DIAGNOSIS — I429 Cardiomyopathy, unspecified: Secondary | ICD-10-CM | POA: Insufficient documentation

## 2020-02-20 MED ORDER — PERFLUTREN LIPID MICROSPHERE
1.0000 mL | INTRAVENOUS | Status: AC | PRN
  Administered 2020-02-20: 2 mL via INTRAVENOUS

## 2020-02-22 NOTE — Progress Notes (Signed)
Cardiology Office Note   Date:  02/23/2020   ID:  [name], DOB 02-10-1951, MRN [id number]  PCP:  Ria Bush, MD  Cardiologist:   Minus Breeding, MD   Chief Complaint  Patient presents with  . Cardiomyopathy      History of Present Illness: Amy Barnes is a 69 y.o. female who seen in the emergency room in February 2021 with nausea and vomiting.  Her troponin was elevated.  Her D-dimer was elevated.  She had a CT scan that was negative for pulmonary embolism but did show coronary calcification and congestive heart failure. Work up during her hospitalization in showed a new CM with an EF of 35-40% and apical WMA by echo on 11/09/2019.  Previous echo 4 days earlier on 11/05/2019 showed an EF of 60-65%.  Diagnostic cath done 11/10/2019 showed normal coronaries.  She has been treated as NICM with Entresto, lasix, and Coreg.  Since she was last seen she has done well.  Her ejection fraction now on the most recent echo done a couple of days ago is now back to 60 to 65%.  She could not tolerate the Entresto and is back on Cozaar.  She is only taking 20 mg Lasix.  She has no acute shortness of breath, PND or orthopnea.  She had no new complications, presyncope or syncope.   Past Medical History:  Diagnosis Date  . Allergic rhinitis   . Ambulates with cane    straight  . Anemia   . Anxiety   . Asthma    seasonal  . Bipolar affective disorder ([place])    takes Synthroid meds for Bipolar  . CAD (coronary artery disease) 07/2016   by CT scan  . Carpal tunnel syndrome    had surgery but occasional still has some issues per patient  . Cataract   . Centrilobular emphysema ([place]) 07/2016   by CT scan - pt not aware of this  . Constipation due to pain medication   . Depression with anxiety   . Diabetes mellitus    type 2 - no meds diet controlled  . GERD (gastroesophageal reflux disease)   . History of blood transfusion   . 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 - now on  chronic doxycycline PO  . Hyperlipidemia   . Hypertension   . OSA (obstructive sleep apnea)    no longer using cpap, uses a bed that raises and lowers hob  . Osteoarthritis   . Osteopenia 08/11/2019   DEXA 07/2019 - T -1.1 R femur (osteopenia)  . Pneumonia   . Restless legs   . Septic arthritis ([place]) 10/11/2012  . Shingles 06/30/2016  . Status post revision of total hip replacement bilateral   prosthetic infection R 2013, L 2015  . Thoracic aortic atherosclerosis ([place]) 11/207   by CT    Past Surgical History:  Procedure Laterality Date  . BREAST CYST ASPIRATION    . CARPAL TUNNEL RELEASE Right 05/15/2011  . CARPAL TUNNEL RELEASE Left 12/24/2017   Procedure: LEFT CARPAL TUNNEL RELEASE;  Surgeon: Leanora Cover, MD;  Location: [place];  Service: Orthopedics;  Laterality: Left;  . CERVICAL FUSION  2012   C2/3/4  . COLONOSCOPY WITH PROPOFOL N/A 12/31/2015   diverticulosis, int hem, o/w normal rpt 10 yrs ([place])  . ESOPHAGOGASTRODUODENOSCOPY (EGD) WITH PROPOFOL N/A 11/12/2019   Procedure: ESOPHAGOGASTRODUODENOSCOPY (EGD) WITH PROPOFOL;  Surgeon: Doran Stabler, MD;  Location: [place];  Service: Gastroenterology;  Laterality: N/A;  . EYE SURGERY Bilateral    cataract surgery with lens implant  . FOOT SURGERY  1982   bone spur  . I & D EXTREMITY  09/06/2012   Budd Palmer, MD; Right;  I&D of right thigh  . I & D EXTREMITY Left 07/2013   wound vac - daily doxycycline indefinitely  . KNEE ARTHROSCOPY  09/01/2012   Dannielle Huh, MD;  Right  . LEFT HEART CATH AND CORONARY ANGIOGRAPHY N/A 11/10/2019   Procedure: LEFT HEART CATH AND CORONARY ANGIOGRAPHY;  Surgeon: Lennette Bihari, MD;  [facility];  Service: Cardiovascular;  Laterality: N/A;  . LUMBAR FUSION  01/08/2012   L3-4  . LUMBAR FUSION  02/2019   unexpectedly discovered MRSA infection - pus Lovell Sheehan)   . LUMBAR LAMINECTOMY/DECOMPRESSION MICRODISCECTOMY N/A 11/13/2016   LAMINOTOMY/LAMINECTOMY  LUMBAR FOUR LUMBAR FIVE  WITH RESECTION OF SYNOVIAL CYST;  Surgeon: Tressie Stalker, MD  . NECK SURGERY     Herniated disk C2,3,4  . NOSE SURGERY    . PARTIAL HYSTERECTOMY  1984   for mennorhagia, ovaries remain  . REVISION TOTAL HIP ARTHROPLASTY Left 08/28/2011  . TMJ ARTHROPLASTY  1982  . TOTAL HIP ARTHROPLASTY Left 2002  . TRIGGER FINGER RELEASE Right 05/15/2011   long finger     Current Outpatient Medications  Medication Sig Dispense Refill  . albuterol (PROVENTIL HFA;VENTOLIN HFA) 108 (90 Base) MCG/ACT inhaler Inhale 2 puffs into the lungs every 6 (six) hours as needed for wheezing or shortness of breath. 1 Inhaler 6  . aspirin 81 MG EC tablet Take 81 mg by mouth at bedtime.     . budesonide-formoterol (SYMBICORT) 160-4.5 MCG/ACT inhaler Inhale 2 puffs into the lungs 2 (two) times daily. 1 Inhaler 3  . celecoxib (CELEBREX) 200 MG capsule Take 1 capsule (200 mg total) by mouth daily. 90 capsule 1  . dicyclomine (BENTYL) 20 MG tablet Take 1 tablet (20 mg total) by mouth in the morning and at bedtime. 60 tablet 2  . docusate sodium (COLACE) 100 MG capsule Take 1 capsule (100 mg total) by mouth 2 (two) times daily. 10 capsule 0  . doxycycline (VIBRA-TABS) 100 MG tablet Take 1 tablet (100 mg total) by mouth 2 (two) times daily. 180 tablet 3  . fluconazole (DIFLUCAN) 200 MG tablet Take 1 tablet (200 mg total) by mouth daily. X 5 days as needed for yeast infection 30 tablet 0  . FLUoxetine (PROZAC) 40 MG capsule Take by mouth daily.    . furosemide (LASIX) 20 MG tablet Take 1 tablet (20 mg total) by mouth daily as needed for edema. 30 tablet 3  . lamoTRIgine (LAMICTAL) 200 MG tablet Take 200 mg by mouth daily.      Marland Kitchen levothyroxine (SYNTHROID) 150 MCG tablet Take 1 tablet (150 mcg total) by mouth at bedtime. 90 tablet 3  . losartan (COZAAR) 50 MG tablet Take 1 tablet (50 mg total) by mouth daily. 90 tablet 1  . metFORMIN (GLUCOPHAGE) 500 MG tablet Take 1 tablet (500 mg total) by mouth every  evening. 30 tablet 11  . montelukast (SINGULAIR) 10 MG tablet Take 1 tablet (10 mg total) by mouth at bedtime. 90 tablet 3  . Multiple Vitamin (MULTIVITAMIN WITH MINERALS) TABS tablet Take 2 tablets by mouth daily.     . ondansetron (ZOFRAN ODT) 8 MG disintegrating tablet Take 1 tablet (8 mg total) by mouth 2 (two) times daily as needed for nausea or vomiting.  10 tablet 0  . Oxcarbazepine (TRILEPTAL) 300 MG tablet Take 300-600 mg by mouth See admin instructions. Take 300 mg by mouth in the morning and 600 mg at night    . oxybutynin (DITROPAN-XL) 10 MG 24 hr tablet Take 1 tablet (10 mg total) by mouth daily. 30 tablet 11  . pantoprazole (PROTONIX) 40 MG tablet Take 1 tablet (40 mg total) by mouth 2 (two) times daily. (Patient taking differently: Take 40 mg by mouth daily. ) 180 tablet 1  . polyethylene glycol (MIRALAX / GLYCOLAX) 17 g packet Take 17 g by mouth 2 (two) times daily as needed for mild constipation. 14 each 0  . prochlorperazine (COMPAZINE) 10 MG tablet Take 1 tablet (10 mg total) by mouth every 6 (six) hours as needed for nausea or vomiting. 30 tablet 1  . promethazine (PHENERGAN) 25 MG suppository Place 1 suppository (25 mg total) rectally every 6 (six) hours as needed for nausea or vomiting. 12 each 0  . simvastatin (ZOCOR) 40 MG tablet Take 1 tablet (40 mg total) by mouth every evening. 90 tablet 1  . sucralfate (CARAFATE) 1 GM/10ML suspension Take 10 mLs (1 g total) by mouth 4 (four) times daily -  with meals and at bedtime. 420 mL 0  . zaleplon (SONATA) 10 MG capsule Take 10 mg by mouth at bedtime as needed for sleep.    . carvedilol (COREG) 6.25 MG tablet Take 1 tablet (6.25 mg total) by mouth 2 (two) times daily. 60 tablet 5   No current facility-administered medications for this visit.    Allergies:   Toviaz [fesoterodine], Cephalexin, Hydrocodone, Entresto [sacubitril-valsartan], Tolterodine tartrate, Risperidone and related, Seroquel [quetiapine fumarate], and Sulfa  antibiotics    ROS:  Please see the history of present illness.   Otherwise, review of systems are positive for none.   All other systems are reviewed and negative.    PHYSICAL EXAM: VS:  BP 128/77   Pulse 80   Temp (!) 97 F (36.1 C)   Ht 5\' 4"  (1.626 m)   Wt (!) 378 lb 3.2 oz (171.6 kg)   LMP  (LMP Unknown)   SpO2 92%   BMI 64.92 kg/m  , BMI Body mass index is 64.92 kg/m. GEN:  No distress NECK:  No jugular venous distention at 90 degrees, waveform within normal limits, carotid upstroke brisk and symmetric, no bruits, no thyromegaly LYMPHATICS:  No cervical adenopathy LUNGS:  Clear to auscultation bilaterally BACK:  No CVA tenderness CHEST:  Unremarkable HEART:  S1 and S2 within normal limits, no S3, no S4, no clicks, no rubs, no murmurs ABD:  Positive bowel sounds normal in frequency in pitch, no bruits, no rebound, no guarding, unable to assess midline mass or bruit with the patient seated. EXT:  2 plus pulses throughout, no edema, no cyanosis no clubbing SKIN:  No rashes no nodules NEURO:  Cranial nerves II through XII grossly intact, motor grossly intact throughout PSYCH:  Cognitively intact, oriented to person place and time  EKG:  EKG is not ordered today.    Recent Labs: 11/11/2019: B Natriuretic Peptide 143.6 11/14/2019: Magnesium 2.0 01/20/2020: ALT 20; BUN 24; Creatinine, Ser 0.71; Hemoglobin 10.7; Platelets 256.0; Potassium 4.7; Sodium 140; TSH 2.42    Lipid Panel    Component Value Date/Time   CHOL 126 01/20/2020 0829   CHOL 133 06/18/2015 0000   TRIG 74.0 01/20/2020 0829   TRIG 62 06/18/2015 0000   HDL 52.40 01/20/2020 0829   CHOLHDL 2  01/20/2020 0829   VLDL 14.8 01/20/2020 0829   LDLCALC 58 01/20/2020 0829   LDLCALC 57 06/18/2015 0000   LDLDIRECT 48.0 03/13/2016 1025      Wt Readings from Last 3 Encounters:  02/23/20 (!) 378 lb 3.2 oz (171.6 kg)  01/27/20 (!) 380 lb 3 oz (172.5 kg)  01/23/20 (!) 375 lb (170.1 kg)      Other studies  Reviewed: Additional studies/ records that were reviewed today include: None. Review of the above records demonstrates:  Please see elsewhere in the note.     ASSESSMENT AND PLAN:  NICM- The patient's ejection fraction is back to baseline  I will continue the ARB and beta-blocker.  No change in therapy.    HTN- Her blood pressure is at target.  No change in therapy.  Sleep apnea- She had a sleep study but results not yet available.   Current medicines are reviewed at length with the patient today.  The patient does not have concerns regarding medicines.  The following changes have been made:  no change  Labs/ tests ordered today include: None No orders of the defined types were placed in this encounter.    Disposition:   FU with me as needed    Signed, Rollene Rotunda, MD  02/23/2020 10:47 PM    [facility]

## 2020-02-23 ENCOUNTER — Encounter: Payer: Self-pay | Admitting: Cardiology

## 2020-02-23 ENCOUNTER — Ambulatory Visit (INDEPENDENT_AMBULATORY_CARE_PROVIDER_SITE_OTHER): Payer: Medicare Other | Admitting: Cardiology

## 2020-02-23 ENCOUNTER — Other Ambulatory Visit: Payer: Self-pay

## 2020-02-23 ENCOUNTER — Telehealth: Payer: Self-pay | Admitting: Cardiology

## 2020-02-23 VITALS — BP 128/77 | HR 80 | Temp 97.0°F | Ht 64.0 in | Wt 378.2 lb

## 2020-02-23 DIAGNOSIS — I429 Cardiomyopathy, unspecified: Secondary | ICD-10-CM | POA: Diagnosis not present

## 2020-02-23 DIAGNOSIS — I10 Essential (primary) hypertension: Secondary | ICD-10-CM | POA: Diagnosis not present

## 2020-02-23 DIAGNOSIS — I2510 Atherosclerotic heart disease of native coronary artery without angina pectoris: Secondary | ICD-10-CM

## 2020-02-23 MED ORDER — FUROSEMIDE 20 MG PO TABS: 20.0000 mg | ORAL_TABLET | Freq: Every day | ORAL | 3 refills | Status: DC | PRN

## 2020-02-23 NOTE — Telephone Encounter (Signed)
Daughter accompanied patient to appointment.

## 2020-02-23 NOTE — Telephone Encounter (Signed)
Patient's daughter calling to request she come with the patient to her appointment today at 4:00pm, because she states the patient cannot walk.

## 2020-02-23 NOTE — Patient Instructions (Signed)
Medication Instructions:  NO CHANGES *If you need a refill on your cardiac medications before your next appointment, please call your pharmacy*  Lab Work: NONE ORDERED THIS VISIT  Testing/Procedures: NONE ORDERED THIS VISIT  Follow-Up: At [facility], you and your health needs are our priority.  As part of our continuing mission to provide you with exceptional heart care, we have created designated Provider Care Teams.  These Care Teams include your primary Cardiologist (physician) and Advanced Practice Providers (APPs -  Physician Assistants and Nurse Practitioners) who all work together to provide you with the care you need, when you need it.  Your next appointment:   FOLLOW UP AS NEEDED 

## 2020-03-09 ENCOUNTER — Telehealth: Payer: Self-pay | Admitting: *Deleted

## 2020-03-09 ENCOUNTER — Other Ambulatory Visit: Payer: Self-pay | Admitting: Cardiovascular Disease

## 2020-03-09 DIAGNOSIS — G4733 Obstructive sleep apnea (adult) (pediatric): Secondary | ICD-10-CM

## 2020-03-09 NOTE — Telephone Encounter (Addendum)
Informed patient of sleep study results and patient understanding was verbalized. Patient understands her sleep study showed  IMPRESSIONS - Moderate obstructive sleep apnea overall (AHI 20.6/h; RDI 22.1/h); however, events were severe during REM sleep (AHI 59.3/h).  DIAGNOSIS - Obstructive Sleep Apnea (327.23 [G47.33 ICD-10]) - Nocturnal Hypoxemia (327.26 [G47.36 ICD-10])  RECOMMENDATIONS - Therapeutic CPAP titration to determine optimal pressure required to alleviate sleep disordered breathing.  Pt is aware and agreeable to her results and she will call call to schedule her test when her daughter can be there to help her in and out of the bed.  Titration scheduled

## 2020-03-09 NOTE — Telephone Encounter (Signed)
Left message to return a call to discuss sleep study results and recommendations. 

## 2020-03-09 NOTE — Telephone Encounter (Signed)
-----   Message from Lennette Bihari, MD sent at 01/28/2020  2:39 PM EDT ----- Burna Mortimer, please notify pt of results and schedule for in-lab CPAP titration study

## 2020-03-12 ENCOUNTER — Encounter (HOSPITAL_BASED_OUTPATIENT_CLINIC_OR_DEPARTMENT_OTHER): Payer: Self-pay

## 2020-03-12 ENCOUNTER — Other Ambulatory Visit (HOSPITAL_COMMUNITY): Payer: Medicare Other | Attending: Cardiovascular Disease

## 2020-03-14 ENCOUNTER — Encounter (HOSPITAL_BASED_OUTPATIENT_CLINIC_OR_DEPARTMENT_OTHER): Payer: Medicare Other | Admitting: Cardiovascular Disease

## 2020-03-15 ENCOUNTER — Other Ambulatory Visit: Payer: Self-pay | Admitting: *Deleted

## 2020-03-15 NOTE — Patient Outreach (Signed)
[facility]) Care Management  03/15/2020  CEARA WRIGHTSON 1951/02/09 [id number]   Call placed to member to follow up on current medical status.  Report she is doing much better, decreased episodes of shortness of breath.  She has been taking all medications as instructed, denies any extremity swelling.  She is not checking her daily weights but report she will start.  State the scale is kept in a place where she is unable to lift it and place on a flat surface for accurate readings.  She will have her daughter to start helping with this.  Confirms she did have sleep study completed, will have follow up on 7/18 for CPAP titration.  Denies any urgent concerns at this time, will follow up within the next month.  If remain stable, will transition to health coach.  [facility] CM Care Plan Problem One     Most Recent Value  Care Plan Problem One Risk for hospitalization related to HF management as evidenced by reported symptoms  Role Documenting the Problem One Care Management [place] for Problem One Active  [facility] Long Term Goal  Member will not have any HF related admissions to hospital within the next 31 days  [facility] Long Term Goal Start Date 02/07/20  [facility] Long Term Goal Met Date 03/15/20  [facility] CM Short Term Goal #1  Member will report completing test for Echo and cardiology appointment within the next 3 weeks  [facility] CM Short Term Goal #1 Start Date 02/07/20  [facility] CM Short Term Goal #1 Met Date 03/15/20  [facility] CM Short Term Goal #2  Member will report decrease in episodes of shortness of breath over the next 2 weeks  [facility] CM Short Term Goal #2 Start Date 02/07/20  [facility] CM Short Term Goal #2 Met Date 03/15/20     Valente David, RN, MSN [place] [phone]

## 2020-03-17 ENCOUNTER — Other Ambulatory Visit: Payer: Self-pay | Admitting: Family Medicine

## 2020-03-21 ENCOUNTER — Ambulatory Visit (INDEPENDENT_AMBULATORY_CARE_PROVIDER_SITE_OTHER): Payer: Medicare Other | Admitting: Internal Medicine

## 2020-03-21 ENCOUNTER — Encounter: Payer: Self-pay | Admitting: Internal Medicine

## 2020-03-21 ENCOUNTER — Other Ambulatory Visit: Payer: Self-pay

## 2020-03-21 VITALS — BP 123/73 | HR 74 | Temp 98.0°F

## 2020-03-21 DIAGNOSIS — M4626 Osteomyelitis of vertebra, lumbar region: Secondary | ICD-10-CM

## 2020-03-21 DIAGNOSIS — T847XXD Infection and inflammatory reaction due to other internal orthopedic prosthetic devices, implants and grafts, subsequent encounter: Secondary | ICD-10-CM

## 2020-03-21 NOTE — Progress Notes (Signed)
RFV: follow up for chronic discitis  Patient ID: Amy Barnes, female   DOB: [birth year], 69 y.o.   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 [name] - next time in November 2021. Last seen in April 2021.   Outpatient Encounter Medications as of 03/21/2020  Medication Sig  . albuterol (PROVENTIL HFA;VENTOLIN HFA) 108 (90 Base) MCG/ACT inhaler Inhale 2 puffs into the lungs every 6 (six) hours as needed for wheezing or shortness of breath.  Marland Kitchen aspirin 81 MG EC tablet Take 81 mg by mouth at bedtime.   . budesonide-formoterol (SYMBICORT) 160-4.5 MCG/ACT inhaler Inhale 2 puffs into the lungs 2 (two) times daily.  . celecoxib (CELEBREX) 200 MG capsule Take 1 capsule (200 mg total) by mouth daily.  Marland Kitchen dicyclomine (BENTYL) 20 MG tablet Take 1 tablet (20 mg total) by mouth in the morning and at bedtime.  . docusate sodium (COLACE) 100 MG capsule Take 1 capsule (100 mg total) by mouth 2 (two) times daily.  Marland Kitchen doxycycline (VIBRA-TABS) 100 MG tablet Take 1 tablet (100 mg total) by mouth 2 (two) times daily.  . fluconazole (DIFLUCAN) 200 MG tablet Take 1 tablet (200 mg total) by mouth daily. X 5 days as needed for yeast infection  . FLUoxetine (PROZAC) 40 MG capsule Take by mouth daily.  . furosemide (LASIX) 20 MG tablet Take 1 tablet (20 mg total) by mouth daily as needed for edema.  Marland Kitchen lamoTRIgine (LAMICTAL) 200 MG tablet Take 200 mg by mouth daily.    Marland Kitchen levothyroxine (SYNTHROID) 150 MCG tablet TAKE ONE TABLET BY MOUTH AT BEDTIME  . losartan (COZAAR) 50 MG tablet Take 1 tablet (50 mg total) by mouth daily.  . metFORMIN (GLUCOPHAGE) 500 MG tablet Take 1 tablet (500 mg total) by mouth every evening.  . montelukast (SINGULAIR) 10 MG tablet Take 1 tablet (10 mg total) by mouth at bedtime.  . Multiple Vitamin (MULTIVITAMIN WITH MINERALS) TABS tablet Take 2 tablets by mouth daily.   . ondansetron  (ZOFRAN ODT) 8 MG disintegrating tablet Take 1 tablet (8 mg total) by mouth 2 (two) times daily as needed for nausea or vomiting.  . Oxcarbazepine (TRILEPTAL) 300 MG tablet Take 300-600 mg by mouth See admin instructions. Take 300 mg by mouth in the morning and 600 mg at night  . oxybutynin (DITROPAN-XL) 10 MG 24 hr tablet Take 1 tablet (10 mg total) by mouth daily.  . pantoprazole (PROTONIX) 40 MG tablet Take 1 tablet (40 mg total) by mouth 2 (two) times daily. (Patient taking differently: Take 40 mg by mouth daily. )  . polyethylene glycol (MIRALAX / GLYCOLAX) 17 g packet Take 17 g by mouth 2 (two) times daily as needed for mild constipation.  . prochlorperazine (COMPAZINE) 10 MG tablet Take 1 tablet (10 mg total) by mouth every 6 (six) hours as needed for nausea or vomiting.  . promethazine (PHENERGAN) 25 MG suppository Place 1 suppository (25 mg total) rectally every 6 (six) hours as needed for nausea or vomiting.  . simvastatin (ZOCOR) 40 MG tablet Take 1 tablet (40 mg total) by mouth every evening.  . sucralfate (CARAFATE) 1 GM/10ML suspension Take 10 mLs (1 g total) by mouth 4 (four) times daily -  with meals and at bedtime.  . zaleplon (SONATA) 10 MG capsule Take 10 mg by mouth at bedtime as needed for sleep.  . carvedilol (COREG) 6.25  MG tablet Take 1 tablet (6.25 mg total) by mouth 2 (two) times daily.   No facility-administered encounter medications on file as of 03/21/2020.     Patient Active Problem List   Diagnosis Date Noted  . Non-ischemic cardiomyopathy ([place]) 12/29/2019  . Acute HFrEF (heart failure with reduced ejection fraction) ([place]) 11/25/2019  . UTI (urinary tract infection) 11/25/2019  . NSTEMI (non-ST elevated myocardial infarction) ([place]) 11/09/2019  . QT prolongation 11/09/2019  . Acute kidney injury ([place]) 11/04/2019  . Hyponatremia 11/03/2019  . Epigastric abdominal pain 11/03/2019  . Nausea and vomiting 11/03/2019  . Diminished pulses in lower extremity 08/11/2019    . Osteopenia 08/11/2019  . Wound infection complicating hardware ([place]) 04/01/2019  . 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 Bilateral lower extremity edema 11/29/2018  . Neurogenic urinary incontinence 11/29/2018  . Urinary incontinence 11/29/2018  . Bladder spasms 11/29/2018  . Discogenic low back pain 11/29/2018  . Chronic sacroiliac joint pain (Bilateral) (R>L) 11/29/2018  . Spondylosis without myelopathy or radiculopathy, lumbar region 11/09/2018  . Chronic low back pain (Bilateral) (L>R) w/o sciatica 11/09/2018  . History of hip replacement (Left) 10/13/2018  . History of back surgery 10/13/2018  . Vitamin D insufficiency 10/13/2018  . Osteoarthritis of facet joint of lumbar spine 10/13/2018  . Osteoarthritis involving multiple joints 10/13/2018  . Cervical radiculopathy (Left) 10/13/2018  . Lumbar facet arthropathy (Bilateral) 10/13/2018  . Lumbar facet syndrome (Bilateral) (L>R) 10/13/2018  . Abnormal MRI, lumbar spine (09/16/2018) 10/13/2018  . Lumbar nerve root compression (L4) (Left) 10/13/2018  . Lumbar foraminal stenosis 10/13/2018  . 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 Chronic low back pain (Primary Area of Pain) (Bilateral) (L>R) w/ sciatica (Left) 09/14/2018  . Chronic lower extremity pain (Secondary Area of Pain) (Left) 09/14/2018  . Chronic hip pain [facility] Area of Pain) (Bilateral) (L>R) 09/14/2018  . Chronic knee pain (Fourth Area of Pain) (Bilateral) (R>L) 09/14/2018  . Pharmacologic therapy 09/14/2018  . Disorder of skeletal system 09/14/2018  . Problems influencing health status 09/14/2018  . Primary osteoarthritis of first carpometacarpal joint of left hand 04/20/2018  . 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 01/16/2018  . PAH (pulmonary artery hypertension) ([place]) 08/03/2017  . Advanced care planning/counseling discussion 06/25/2017  . Synovial cyst of  lumbar facet joint 11/13/2016  . Thoracic aortic atherosclerosis ([place])   . CAD (coronary artery disease) 07/30/2016  . Centrilobular emphysema ([place]) 07/30/2016  . Ex-smoker 07/08/2016  . Nonspecific abnormal electrocardiogram (ECG) (EKG) 07/08/2016  . Chronic lumbar radicular pain (L4) (Bilateral) 06/09/2016  . DOE (dyspnea on exertion) 05/22/2016  . Degenerative joint disease (DJD) of hip 04/17/2016  . Somatic dysfunction of sacroiliac joint 04/17/2016  . S/P Lumbar Fusion (L3-4 PLIF) 04/17/2016  . Cervical fusion syndrome 04/17/2016  . Pedal edema 02/19/2016  . Chronic pain syndrome 02/19/2016  . Abdominal aortic atherosclerosis ([employer]) 12/11/2015  . MRSA (methicillin resistant Staphylococcus aureus) infection 10/11/2012  . Urinary urgency 07/28/2012  . Infection or inflammatory reaction due to internal joint prosthesis ([place]) 12/03/2011  . Morbid obesity with BMI of 60.0-69.9, adult ([place]) 05/13/2011  . Essential hypertension 05/17/2010  . Cervical radiculopathy (Right) 12/25/2009  . Benign positional vertigo 07/21/2008  . Hyperlipidemia associated with type 2 diabetes mellitus ([place]) 08/16/2007  . Hypothyroidism 07/30/2007  . DM type 2 with diabetic peripheral neuropathy ([place]) 07/30/2007  . Normocytic anemia 07/30/2007  . Bipolar disorder ([place]) 07/30/2007  . Allergic  rhinitis 07/30/2007  . Asthma 07/30/2007  . GERD 07/30/2007  . Osteoarthritis 07/30/2007  . OSA on CPAP 07/30/2007     Health Maintenance Due  Topic Date Due  . DTAP VACCINES (1) 08/07/1951  . COVID-19 Vaccine (1) Never done  . DTaP/Tdap/Td (2 - Tdap) 06/29/2017     Review of Systems  Physical Exam   LMP  (LMP Unknown)    No results found for: CD4TCELL No results found for: CD4TABS No results found for: HIV1RNAQUANT No results found for: HEPBSAB No results found for: RPR, LABRPR  CBC Lab Results  Component Value Date   WBC 6.1 01/20/2020   RBC 3.66 (L) 01/20/2020   HGB 10.7 (L) 01/20/2020   HCT  32.5 (L) 01/20/2020   PLT 256.0 01/20/2020   MCV 88.7 01/20/2020   MCH 28.4 11/14/2019   MCHC 32.9 01/20/2020   RDW 16.3 (H) 01/20/2020   LYMPHSABS 1.2 01/20/2020   MONOABS 0.6 01/20/2020   EOSABS 0.3 01/20/2020    BMET Lab Results  Component Value Date   NA 140 01/20/2020   K 4.7 01/20/2020   CL 103 01/20/2020   CO2 30 01/20/2020   GLUCOSE 161 (H) 01/20/2020   BUN 24 (H) 01/20/2020   CREATININE 0.71 01/20/2020   CALCIUM 9.3 01/20/2020   GFRNONAA >60 11/14/2019   GFRAA >60 11/14/2019    Methicillin resistant staphylococcus aureus    MIC    CIPROFLOXACIN >=8 RESISTANT  Resistant    CLINDAMYCIN >=8 RESISTANT  Resistant    ERYTHROMYCIN >=8 RESISTANT  Resistant    GENTAMICIN <=0.5 SENSI... Sensitive    Inducible Clindamycin NEGATIVE  Sensitive    OXACILLIN >=4 RESISTANT  Resistant    RIFAMPIN <=0.5 SENSI... Sensitive    TETRACYCLINE 2 SENSITIVE  Sensitive    TRIMETH/SULFA <=10 SENSIT... Sensitive    VANCOMYCIN <=0.5 SENSI... 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 Place on chronic suppression.  Virtual appt in 4 months.

## 2020-03-22 LAB — C-REACTIVE PROTEIN: CRP: 10.9 mg/L — ABNORMAL HIGH (ref <8.0)

## 2020-03-22 LAB — SEDIMENTATION RATE: Sed Rate: 17 mm/h (ref 0–30)

## 2020-04-13 ENCOUNTER — Other Ambulatory Visit (HOSPITAL_COMMUNITY): Payer: Medicare Other

## 2020-04-15 ENCOUNTER — Ambulatory Visit (HOSPITAL_BASED_OUTPATIENT_CLINIC_OR_DEPARTMENT_OTHER): Payer: Medicare Other | Attending: Cardiovascular Disease | Admitting: Cardiovascular Disease

## 2020-04-15 ENCOUNTER — Other Ambulatory Visit: Payer: Self-pay

## 2020-04-15 DIAGNOSIS — Z79899 Other long term (current) drug therapy: Secondary | ICD-10-CM | POA: Insufficient documentation

## 2020-04-15 DIAGNOSIS — Z79890 Hormone replacement therapy (postmenopausal): Secondary | ICD-10-CM | POA: Insufficient documentation

## 2020-04-15 DIAGNOSIS — G4733 Obstructive sleep apnea (adult) (pediatric): Secondary | ICD-10-CM | POA: Diagnosis not present

## 2020-04-15 DIAGNOSIS — Z7982 Long term (current) use of aspirin: Secondary | ICD-10-CM | POA: Insufficient documentation

## 2020-04-15 DIAGNOSIS — Z7951 Long term (current) use of inhaled steroids: Secondary | ICD-10-CM | POA: Insufficient documentation

## 2020-04-15 DIAGNOSIS — Z7984 Long term (current) use of oral hypoglycemic drugs: Secondary | ICD-10-CM | POA: Insufficient documentation

## 2020-04-17 ENCOUNTER — Other Ambulatory Visit: Payer: Self-pay | Admitting: *Deleted

## 2020-04-17 NOTE — Patient Outreach (Signed)
[facility]) Care Management  04/17/2020  Amy Barnes 03/14/1951 [id number]   Call placed to member to follow up on management of chronic medical conditions.  She state this is not a good time to talk as she is not feeling well.  This care manager inquired about symptoms and potential need to contact MD, she state she does not think that is necessary but also does not elaborate on symptoms, only state she needed to rest.  Requested for member to call this care manager back later today to provide update on status.  If no call back, will follow up within the next 3-4 business days.  Kemper Durie, [place], MSN [facility] Care Management  [facility] Manager [phone]

## 2020-04-20 ENCOUNTER — Other Ambulatory Visit: Payer: Self-pay | Admitting: *Deleted

## 2020-04-20 NOTE — Patient Outreach (Signed)
[facility]) Care Management  04/20/2020  VELTA ROCKHOLT 1951-02-03 [id number]   Outreach attempt #2, successful.  Call placed to member to follow up on management of chronic medical conditions.  She report she is doing well, state she has recently lost her pet and was having a hard time dealing with that, which is why she wasn't up to talking during last outreach.  State her shortness of breath is better but it "comes and goes."  Confirms she did have her appointment with the sleep clinic, waiting for the provider to call to discuss CPAP adjustments.  State she is working on increasing her exercise routine, interested in going to [employer].  State she is looking into a Systems analyst.  Advised that [facility] has program with [employer], will contact RN assigned to program to discuss how member can get involved.  Member denies any urgent concerns at this time, agrees to follow up within the next 2 weeks regarding [employer] program.  Will transition to health coach at that time.  Kemper Durie, [place], MSN [facility] Care Management  [facility] Manager [phone]

## 2020-04-25 DIAGNOSIS — F3181 Bipolar II disorder: Secondary | ICD-10-CM | POA: Diagnosis not present

## 2020-05-02 ENCOUNTER — Encounter (HOSPITAL_BASED_OUTPATIENT_CLINIC_OR_DEPARTMENT_OTHER): Payer: Self-pay | Admitting: Cardiovascular Disease

## 2020-05-02 NOTE — Procedures (Signed)
Patient Name: Amy Barnes, [place] Date: 04/15/2020 Gender: Female D.O.B: August 11, 1951 Age (years): 24 Referring Provider: Joni Reining NP Height (inches): 64 Interpreting Physician: Nicki Guadalajara MD, ABSM Weight (lbs): 375 RPSGT: Rosette Reveal BMI: 64 MRN: [id number] Neck Size: 17.00  CLINICAL INFORMATION The patient is referred for a BiPAP titration to treat sleep apnea.  Date of NPSG: 01/23/2020:  AHI 20.6/h; RDI 22.1/h; REM AHI 59.3/h; supine AHI 20.6/h; O2 nadir 67%.  SLEEP STUDY TECHNIQUE As per the AASM Manual for the Scoring of Sleep and Associated Events v2.3 (April 2016) with a hypopnea requiring 4% desaturations.  The channels recorded and monitored were frontal, central and occipital EEG, electrooculogram (EOG), submentalis EMG (chin), nasal and oral airflow, thoracic and abdominal wall motion, anterior tibialis EMG, snore microphone, electrocardiogram, and pulse oximetry. Bilevel positive airway pressure (BPAP) was initiated at the beginning of the study and titrated to treat sleep-disordered breathing.  MEDICATIONS albuterol (PROVENTIL HFA;VENTOLIN HFA) 108 (90 Base) MCG/ACT inhaler aspirin 81 MG EC tablet budesonide-formoterol (SYMBICORT) 160-4.5 MCG/ACT inhaler carvedilol (COREG) 6.25 MG tablet(Expired) celecoxib (CELEBREX) 200 MG capsule dicyclomine (BENTYL) 20 MG tablet docusate sodium (COLACE) 100 MG capsule doxycycline (VIBRA-TABS) 100 MG tablet fluconazole (DIFLUCAN) 200 MG tablet FLUoxetine (PROZAC) 40 MG capsule furosemide (LASIX) 20 MG tablet(Expired) lamoTRIgine (LAMICTAL) 200 MG tablet levothyroxine (SYNTHROID) 150 MCG tablet losartan (COZAAR) 50 MG tablet(Expired) metFORMIN (GLUCOPHAGE) 500 MG tablet montelukast (SINGULAIR) 10 MG tablet Multiple Vitamin (MULTIVITAMIN WITH MINERALS) TABS tablet ondansetron (ZOFRAN ODT) 8 MG disintegrating tablet Oxcarbazepine (TRILEPTAL) 300 MG tablet oxybutynin (DITROPAN-XL) 10 MG 24 hr  tablet pantoprazole (PROTONIX) 40 MG tablet polyethylene glycol (MIRALAX / GLYCOLAX) 17 g packet prochlorperazine (COMPAZINE) 10 MG tablet promethazine (PHENERGAN) 25 MG suppository simvastatin (ZOCOR) 40 MG tablet sucralfate (CARAFATE) 1 GM/10ML suspension zaleplon (SONATA) 10 MG capsule Medications self-administered by patient taken the night of the study : N/A  RESPIRATORY PARAMETERS Optimal IPAP Pressure (cm): 24 AHI at Optimal Pressure (/hr) 0.8 Optimal EPAP Pressure (cm): 20   Overall Minimal O2 (%): 82.0 Minimal O2 at Optimal Pressure (%): 91.0  SLEEP ARCHITECTURE Start Time: 10:21:20 PM Stop Time: 4:39:25 AM Total Time (min): 378.1 Total Sleep Time (min): 340 Sleep Latency (min): 13.9 Sleep Efficiency (%): 89.9% REM Latency (min): 99.0 WASO (min): 24.2 Stage N1 (%): 2.5% Stage N2 (%): 46.2% Stage N3 (%): 0.0% Stage R (%): 51.3 Supine (%): 100.00 Arousal Index (/hr): 3.0   CARDIAC DATA The 2 lead EKG demonstrated sinus rhythm. The mean heart rate was 79.6 beats per minute. Other EKG findings include: None.  LEG MOVEMENT DATA The total Periodic Limb Movements of Sleep (PLMS) were 0. The PLMS index was 0.0. A PLMS index of <15 is considered normal in adults.  IMPRESSIONS - CPAP was initiated at 5 cm and was titrated to 18 cm with transition to BiPAP titrated to optimal BiPAP pressure at 24/20 cm of water. - Central sleep apnea was not noted during this titration (CAI = 0.0/h). - Moderete oxygen desaturations to a nadir of 82.0% at 16 cm. - The patient snored with soft snoring volume. - No cardiac abnormalities were observed during this study. - Clinically significant periodic limb movements were not noted during this study. Arousals associated with PLMs were rare.  DIAGNOSIS - Obstructive Sleep Apnea (G47.33)  RECOMMENDATIONS - Recommend an initial trial of BiPAP therapy at 24/20 cm H2O with heated humidification.  A Small size Fisher&Paykel Full Face Mask Simplus mask  was used for the titration. - Effort should be  made to optimize nasal and oropharyngeal patency. - Avoid alcohol, sedatives and other CNS depressants that may worsen sleep apnea and disrupt normal sleep architecture. - Sleep hygiene should be reviewed to assess factors that may improve sleep quality. - Weight management and regular exercise should be initiated or continued. - Recommend a download in 30 days and sleep clinic evaluation after 4 weeks of therapy.  [Electronically signed] 05/02/2020 06:20 PM  Nicki Guadalajara MD, [facility], ABSM Diplomate, American Board of Sleep Medicine   NPI: [id number]  [facility] PH: [phone]   FX: [phone] ACCREDITED BY THE AMERICAN ACADEMY OF SLEEP MEDICINE

## 2020-05-03 ENCOUNTER — Emergency Department (HOSPITAL_BASED_OUTPATIENT_CLINIC_OR_DEPARTMENT_OTHER): Payer: Medicare Other

## 2020-05-03 ENCOUNTER — Encounter (HOSPITAL_BASED_OUTPATIENT_CLINIC_OR_DEPARTMENT_OTHER): Payer: Self-pay

## 2020-05-03 ENCOUNTER — Telehealth: Payer: Self-pay

## 2020-05-03 ENCOUNTER — Encounter: Payer: Self-pay | Admitting: Family Medicine

## 2020-05-03 ENCOUNTER — Telehealth: Payer: Self-pay | Admitting: *Deleted

## 2020-05-03 ENCOUNTER — Inpatient Hospital Stay (HOSPITAL_BASED_OUTPATIENT_CLINIC_OR_DEPARTMENT_OTHER)
Admission: EM | Admit: 2020-05-03 | Discharge: 2020-05-08 | DRG: 177 | Disposition: A | Discharge status: Home / Self Care | Payer: Medicare Other | Attending: Internal Medicine | Admitting: Internal Medicine

## 2020-05-03 ENCOUNTER — Other Ambulatory Visit: Payer: Self-pay

## 2020-05-03 DIAGNOSIS — F3181 Bipolar II disorder: Secondary | ICD-10-CM | POA: Diagnosis present

## 2020-05-03 DIAGNOSIS — J1282 Pneumonia due to coronavirus disease 2019: Secondary | ICD-10-CM

## 2020-05-03 DIAGNOSIS — I517 Cardiomegaly: Secondary | ICD-10-CM | POA: Diagnosis not present

## 2020-05-03 DIAGNOSIS — G4733 Obstructive sleep apnea (adult) (pediatric): Secondary | ICD-10-CM | POA: Diagnosis present

## 2020-05-03 DIAGNOSIS — I2510 Atherosclerotic heart disease of native coronary artery without angina pectoris: Secondary | ICD-10-CM | POA: Diagnosis present

## 2020-05-03 DIAGNOSIS — N390 Urinary tract infection, site not specified: Secondary | ICD-10-CM | POA: Diagnosis present

## 2020-05-03 DIAGNOSIS — Z833 Family history of diabetes mellitus: Secondary | ICD-10-CM

## 2020-05-03 DIAGNOSIS — J440 Chronic obstructive pulmonary disease with acute lower respiratory infection: Secondary | ICD-10-CM | POA: Diagnosis not present

## 2020-05-03 DIAGNOSIS — J9601 Acute respiratory failure with hypoxia: Secondary | ICD-10-CM | POA: Diagnosis not present

## 2020-05-03 DIAGNOSIS — G894 Chronic pain syndrome: Secondary | ICD-10-CM | POA: Diagnosis present

## 2020-05-03 DIAGNOSIS — Z882 Allergy status to sulfonamides status: Secondary | ICD-10-CM

## 2020-05-03 DIAGNOSIS — Z6841 Body Mass Index (BMI) 40.0 and over, adult: Secondary | ICD-10-CM | POA: Diagnosis not present

## 2020-05-03 DIAGNOSIS — Z885 Allergy status to narcotic agent status: Secondary | ICD-10-CM

## 2020-05-03 DIAGNOSIS — M85851 Other specified disorders of bone density and structure, right thigh: Secondary | ICD-10-CM | POA: Diagnosis present

## 2020-05-03 DIAGNOSIS — E785 Hyperlipidemia, unspecified: Secondary | ICD-10-CM | POA: Diagnosis present

## 2020-05-03 DIAGNOSIS — Z8249 Family history of ischemic heart disease and other diseases of the circulatory system: Secondary | ICD-10-CM

## 2020-05-03 DIAGNOSIS — U071 COVID-19: Principal | ICD-10-CM | POA: Diagnosis present

## 2020-05-03 DIAGNOSIS — I119 Hypertensive heart disease without heart failure: Secondary | ICD-10-CM | POA: Diagnosis present

## 2020-05-03 DIAGNOSIS — Z96643 Presence of artificial hip joint, bilateral: Secondary | ICD-10-CM | POA: Diagnosis present

## 2020-05-03 DIAGNOSIS — E039 Hypothyroidism, unspecified: Secondary | ICD-10-CM | POA: Diagnosis present

## 2020-05-03 DIAGNOSIS — K219 Gastro-esophageal reflux disease without esophagitis: Secondary | ICD-10-CM | POA: Diagnosis present

## 2020-05-03 DIAGNOSIS — G2581 Restless legs syndrome: Secondary | ICD-10-CM | POA: Diagnosis present

## 2020-05-03 DIAGNOSIS — E662 Morbid (severe) obesity with alveolar hypoventilation: Secondary | ICD-10-CM | POA: Diagnosis present

## 2020-05-03 DIAGNOSIS — B964 Proteus (mirabilis) (morganii) as the cause of diseases classified elsewhere: Secondary | ICD-10-CM | POA: Diagnosis present

## 2020-05-03 DIAGNOSIS — Z888 Allergy status to other drugs, medicaments and biological substances status: Secondary | ICD-10-CM

## 2020-05-03 DIAGNOSIS — Z9981 Dependence on supplemental oxygen: Secondary | ICD-10-CM | POA: Diagnosis present

## 2020-05-03 DIAGNOSIS — E1142 Type 2 diabetes mellitus with diabetic polyneuropathy: Secondary | ICD-10-CM | POA: Diagnosis present

## 2020-05-03 DIAGNOSIS — R197 Diarrhea, unspecified: Secondary | ICD-10-CM | POA: Diagnosis present

## 2020-05-03 DIAGNOSIS — Z87891 Personal history of nicotine dependence: Secondary | ICD-10-CM

## 2020-05-03 DIAGNOSIS — E1169 Type 2 diabetes mellitus with other specified complication: Secondary | ICD-10-CM | POA: Diagnosis present

## 2020-05-03 DIAGNOSIS — Z8614 Personal history of Methicillin resistant Staphylococcus aureus infection: Secondary | ICD-10-CM

## 2020-05-03 DIAGNOSIS — I10 Essential (primary) hypertension: Secondary | ICD-10-CM | POA: Diagnosis present

## 2020-05-03 DIAGNOSIS — Z7982 Long term (current) use of aspirin: Secondary | ICD-10-CM

## 2020-05-03 DIAGNOSIS — F418 Other specified anxiety disorders: Secondary | ICD-10-CM | POA: Diagnosis present

## 2020-05-03 DIAGNOSIS — Z79899 Other long term (current) drug therapy: Secondary | ICD-10-CM

## 2020-05-03 DIAGNOSIS — R0602 Shortness of breath: Secondary | ICD-10-CM | POA: Diagnosis not present

## 2020-05-03 DIAGNOSIS — R0902 Hypoxemia: Secondary | ICD-10-CM

## 2020-05-03 DIAGNOSIS — Z79890 Hormone replacement therapy (postmenopausal): Secondary | ICD-10-CM

## 2020-05-03 DIAGNOSIS — Z66 Do not resuscitate: Secondary | ICD-10-CM | POA: Diagnosis present

## 2020-05-03 DIAGNOSIS — J309 Allergic rhinitis, unspecified: Secondary | ICD-10-CM | POA: Diagnosis present

## 2020-05-03 DIAGNOSIS — G5600 Carpal tunnel syndrome, unspecified upper limb: Secondary | ICD-10-CM | POA: Diagnosis present

## 2020-05-03 DIAGNOSIS — F319 Bipolar disorder, unspecified: Secondary | ICD-10-CM | POA: Diagnosis present

## 2020-05-03 DIAGNOSIS — J811 Chronic pulmonary edema: Secondary | ICD-10-CM | POA: Diagnosis not present

## 2020-05-03 DIAGNOSIS — Z7984 Long term (current) use of oral hypoglycemic drugs: Secondary | ICD-10-CM

## 2020-05-03 DIAGNOSIS — J9611 Chronic respiratory failure with hypoxia: Secondary | ICD-10-CM | POA: Diagnosis present

## 2020-05-03 DIAGNOSIS — Z981 Arthrodesis status: Secondary | ICD-10-CM

## 2020-05-03 HISTORY — DX: COVID-19: U07.1

## 2020-05-03 HISTORY — DX: Pneumonia due to coronavirus disease 2019: J12.82

## 2020-05-03 HISTORY — DX: Hypothyroidism, unspecified: E03.9

## 2020-05-03 LAB — CBC WITH DIFFERENTIAL/PLATELET
Abs Immature Granulocytes: 0.02 10*3/uL (ref 0.00–0.07)
Basophils Absolute: 0 10*3/uL (ref 0.0–0.1)
Basophils Relative: 0 %
Eosinophils Absolute: 0 10*3/uL (ref 0.0–0.5)
Eosinophils Relative: 0 %
HCT: 34.1 % — ABNORMAL LOW (ref 36.0–46.0)
Hemoglobin: 10.7 g/dL — ABNORMAL LOW (ref 12.0–15.0)
Immature Granulocytes: 1 %
Lymphocytes Relative: 23 %
Lymphs Abs: 1 10*3/uL (ref 0.7–4.0)
MCH: 27.4 pg (ref 26.0–34.0)
MCHC: 31.4 g/dL (ref 30.0–36.0)
MCV: 87.2 fL (ref 80.0–100.0)
Monocytes Absolute: 0.5 10*3/uL (ref 0.1–1.0)
Monocytes Relative: 12 %
Neutro Abs: 2.8 10*3/uL (ref 1.7–7.7)
Neutrophils Relative %: 64 %
Platelets: 182 10*3/uL (ref 150–400)
RBC: 3.91 MIL/uL (ref 3.87–5.11)
RDW: 16.7 % — ABNORMAL HIGH (ref 11.5–15.5)
WBC: 4.3 10*3/uL (ref 4.0–10.5)
nRBC: 0 % (ref 0.0–0.2)

## 2020-05-03 LAB — COMPREHENSIVE METABOLIC PANEL
ALT: 53 U/L — ABNORMAL HIGH (ref 0–44)
AST: 58 U/L — ABNORMAL HIGH (ref 15–41)
Albumin: 4.1 g/dL (ref 3.5–5.0)
Alkaline Phosphatase: 95 U/L (ref 38–126)
Anion gap: 12 (ref 5–15)
BUN: 13 mg/dL (ref 8–23)
CO2: 27 mmol/L (ref 22–32)
Calcium: 8.8 mg/dL — ABNORMAL LOW (ref 8.9–10.3)
Chloride: 95 mmol/L — ABNORMAL LOW (ref 98–111)
Creatinine, Ser: 0.73 mg/dL (ref 0.44–1.00)
GFR calc Af Amer: >60 mL/min (ref >60)
GFR calc non Af Amer: >60 mL/min (ref >60)
Glucose, Bld: 129 mg/dL — ABNORMAL HIGH (ref 70–99)
Potassium: 3.8 mmol/L (ref 3.5–5.1)
Sodium: 134 mmol/L — ABNORMAL LOW (ref 135–145)
Total Bilirubin: 0.7 mg/dL (ref 0.3–1.2)
Total Protein: 7 g/dL (ref 6.5–8.1)

## 2020-05-03 LAB — FERRITIN: Ferritin: 73 ng/mL (ref 11–307)

## 2020-05-03 LAB — SARS CORONAVIRUS 2 BY RT PCR (HOSPITAL ORDER, PERFORMED IN ~~LOC~~ HOSPITAL LAB): SARS Coronavirus 2: POSITIVE — AB

## 2020-05-03 LAB — FIBRINOGEN: Fibrinogen: 434 mg/dL (ref 210–475)

## 2020-05-03 LAB — LACTIC ACID, PLASMA: Lactic Acid, Venous: 0.9 mmol/L (ref 0.5–1.9)

## 2020-05-03 LAB — TRIGLYCERIDES: Triglycerides: 106 mg/dL (ref <150)

## 2020-05-03 LAB — CBG MONITORING, ED: Glucose-Capillary: 144 mg/dL — ABNORMAL HIGH (ref 70–99)

## 2020-05-03 LAB — LACTATE DEHYDROGENASE: LDH: 271 U/L — ABNORMAL HIGH (ref 98–192)

## 2020-05-03 LAB — PROCALCITONIN: Procalcitonin: <0.1 ng/mL

## 2020-05-03 LAB — D-DIMER, QUANTITATIVE: D-Dimer, Quant: 1.76 ug/mL-FEU — ABNORMAL HIGH (ref 0.00–0.50)

## 2020-05-03 LAB — C-REACTIVE PROTEIN: CRP: 7.2 mg/dL — ABNORMAL HIGH (ref <1.0)

## 2020-05-03 MED ORDER — ENOXAPARIN SODIUM 80 MG/0.8ML ~~LOC~~ SOLN
80.0000 mg | SUBCUTANEOUS | Status: DC
  Administered 2020-05-03 – 2020-05-07 (×5): 80 mg via SUBCUTANEOUS
  Filled 2020-05-03 (×5): qty 0.8

## 2020-05-03 MED ORDER — PANTOPRAZOLE SODIUM 40 MG PO TBEC
40.0000 mg | DELAYED_RELEASE_TABLET | Freq: Every day | ORAL | Status: DC
  Administered 2020-05-03 – 2020-05-08 (×6): 40 mg via ORAL
  Filled 2020-05-03 (×6): qty 1

## 2020-05-03 MED ORDER — HYDROMORPHONE HCL 1 MG/ML IJ SOLN
1.0000 mg | INTRAMUSCULAR | Status: AC | PRN
  Administered 2020-05-03 – 2020-05-04 (×3): 1 mg via INTRAVENOUS
  Filled 2020-05-03 (×2): qty 1

## 2020-05-03 MED ORDER — SODIUM CHLORIDE 0.9 % IV SOLN
100.0000 mg | Freq: Every day | INTRAVENOUS | Status: AC
  Administered 2020-05-04 – 2020-05-07 (×4): 100 mg via INTRAVENOUS
  Filled 2020-05-03 (×4): qty 20

## 2020-05-03 MED ORDER — HYDROMORPHONE HCL 1 MG/ML IJ SOLN
1.0000 mg | Freq: Once | INTRAMUSCULAR | Status: DC
  Filled 2020-05-03: qty 1

## 2020-05-03 MED ORDER — ASCORBIC ACID 500 MG PO TABS
500.0000 mg | ORAL_TABLET | Freq: Every day | ORAL | Status: DC
  Administered 2020-05-04 – 2020-05-08 (×5): 500 mg via ORAL
  Filled 2020-05-03 (×6): qty 1

## 2020-05-03 MED ORDER — ALBUTEROL SULFATE HFA 108 (90 BASE) MCG/ACT IN AERS
8.0000 | INHALATION_SPRAY | Freq: Once | RESPIRATORY_TRACT | Status: AC
  Administered 2020-05-03: 8 via RESPIRATORY_TRACT
  Filled 2020-05-03: qty 6.7

## 2020-05-03 MED ORDER — SODIUM CHLORIDE 0.9 % IV SOLN
100.0000 mg | Freq: Every day | INTRAVENOUS | Status: DC
  Administered 2020-05-03: 100 mg via INTRAVENOUS
  Filled 2020-05-03: qty 20

## 2020-05-03 MED ORDER — LEVOTHYROXINE SODIUM 75 MCG PO TABS
150.0000 ug | ORAL_TABLET | Freq: Every day | ORAL | Status: DC
  Administered 2020-05-03 – 2020-05-07 (×5): 150 ug via ORAL
  Filled 2020-05-03 (×4): qty 2
  Filled 2020-05-03: qty 1

## 2020-05-03 MED ORDER — ZINC SULFATE 220 (50 ZN) MG PO CAPS
220.0000 mg | ORAL_CAPSULE | Freq: Every day | ORAL | Status: DC
  Administered 2020-05-04 – 2020-05-08 (×5): 220 mg via ORAL
  Filled 2020-05-03 (×6): qty 1

## 2020-05-03 MED ORDER — INSULIN ASPART 100 UNIT/ML ~~LOC~~ SOLN
0.0000 [IU] | SUBCUTANEOUS | Status: DC
  Administered 2020-05-03: 1 [IU] via SUBCUTANEOUS
  Administered 2020-05-04 (×4): 2 [IU] via SUBCUTANEOUS
  Filled 2020-05-03: qty 2
  Filled 2020-05-03: qty 1

## 2020-05-03 MED ORDER — GUAIFENESIN-DM 100-10 MG/5ML PO SYRP
10.0000 mL | ORAL_SOLUTION | ORAL | Status: DC | PRN
  Administered 2020-05-05 – 2020-05-07 (×3): 10 mL via ORAL
  Filled 2020-05-03 (×4): qty 10

## 2020-05-03 MED ORDER — SODIUM CHLORIDE 0.9 % IV SOLN
250.0000 mL | INTRAVENOUS | Status: DC | PRN
  Administered 2020-05-03 – 2020-05-05 (×2): 250 mL via INTRAVENOUS

## 2020-05-03 MED ORDER — METHYLPREDNISOLONE SODIUM SUCC 125 MG IJ SOLR
0.2500 mg/kg | Freq: Two times a day (BID) | INTRAMUSCULAR | Status: DC
  Administered 2020-05-03: 42.5 mg via INTRAVENOUS
  Filled 2020-05-03: qty 2

## 2020-05-03 MED ORDER — MONTELUKAST SODIUM 10 MG PO TABS
10.0000 mg | ORAL_TABLET | Freq: Every day | ORAL | Status: DC
  Administered 2020-05-04 – 2020-05-07 (×4): 10 mg via ORAL
  Filled 2020-05-03 (×5): qty 1

## 2020-05-03 MED ORDER — CARVEDILOL 6.25 MG PO TABS
6.2500 mg | ORAL_TABLET | Freq: Two times a day (BID) | ORAL | Status: DC
  Administered 2020-05-03 – 2020-05-08 (×8): 6.25 mg via ORAL
  Filled 2020-05-03 (×11): qty 1

## 2020-05-03 MED ORDER — MOMETASONE FURO-FORMOTEROL FUM 200-5 MCG/ACT IN AERO
2.0000 | INHALATION_SPRAY | Freq: Two times a day (BID) | RESPIRATORY_TRACT | Status: DC
  Administered 2020-05-04 – 2020-05-07 (×9): 2 via RESPIRATORY_TRACT
  Filled 2020-05-03 (×2): qty 8.8

## 2020-05-03 MED ORDER — SODIUM CHLORIDE 0.9 % IV SOLN
200.0000 mg | Freq: Once | INTRAVENOUS | Status: DC
  Filled 2020-05-03: qty 40

## 2020-05-03 MED ORDER — SODIUM CHLORIDE 0.9% FLUSH
3.0000 mL | Freq: Two times a day (BID) | INTRAVENOUS | Status: DC
  Administered 2020-05-04 – 2020-05-08 (×10): 3 mL via INTRAVENOUS
  Filled 2020-05-03: qty 3

## 2020-05-03 MED ORDER — SODIUM CHLORIDE 0.9% FLUSH
3.0000 mL | INTRAVENOUS | Status: DC | PRN
  Filled 2020-05-03: qty 3

## 2020-05-03 MED ORDER — SODIUM CHLORIDE 0.9% FLUSH
3.0000 mL | Freq: Two times a day (BID) | INTRAVENOUS | Status: DC
  Administered 2020-05-04 – 2020-05-08 (×7): 3 mL via INTRAVENOUS
  Filled 2020-05-03: qty 3

## 2020-05-03 MED ORDER — METHYLPREDNISOLONE SODIUM SUCC 125 MG IJ SOLR: 60.0000 mg | Freq: Four times a day (QID) | INTRAMUSCULAR | Status: DC

## 2020-05-03 MED ORDER — SODIUM CHLORIDE 0.9 % IV SOLN: 100.0000 mg | Freq: Every day | INTRAVENOUS | Status: DC

## 2020-05-03 MED ORDER — ALBUTEROL SULFATE HFA 108 (90 BASE) MCG/ACT IN AERS
2.0000 | INHALATION_SPRAY | Freq: Four times a day (QID) | RESPIRATORY_TRACT | Status: DC | PRN
  Administered 2020-05-04 – 2020-05-06 (×2): 2 via RESPIRATORY_TRACT
  Filled 2020-05-03: qty 6.7

## 2020-05-03 NOTE — Telephone Encounter (Signed)
Amy Barnes (DPR signed) pt had + covid home test; pt has SOB with any exertion (pt usually has some SOB but has worsened. Symptoms started on 04/30/20 or 05/01/20. There are 3 other people who live in the house that already dx with + covid. Pt has prod cough with yellow phlegm, chills and body aches. Had H/A first of wk. No fever noted. No other covid symptoms at this time.pulse ox 84% that went to 91% after deep breaths. Amy Barnes said pt refuses to go to [facility] and sit and wait and Dr Sharen Hones said with symptoms and pulse ox of 84% pt needs to go to ED. Amy Barnes said pt still does not want to go to [facility] or ED. Dr Sharen Hones spoke with Amy Barnes and pt. Sending note to Dr Sharen Hones.

## 2020-05-03 NOTE — ED Provider Notes (Addendum)
[facility] EMERGENCY DEPARTMENT Provider Note   CSN: [id number] Arrival date & time: 05/03/20  1506     History Chief Complaint  Patient presents with  . Shortness of Breath    Amy Barnes is a 69 y.o. female.  Patient started not feeling well on Tuesday.  Had congestion shortness of breath.  Patient initially thought it was related to her CPAP.  But she continued to have shortness of breath did a pharmacy Covid test that was positive.  Thedore Mins stated it was yesterday.  Patient told me that the positive test was today.  She had pulse ox at home her room air pulse ox's were reading 80% contacted her primary care doctor which is [employer].  Patient does not use oxygen at home.  Patient has not had any of the Covid vaccines.  She was around somebody who was positive.        Past Medical History:  Diagnosis Date  . Allergic rhinitis   . Ambulates with cane    straight  . Anemia   . Anxiety   . Asthma    seasonal  . Bipolar affective disorder (HCC)    takes Synthroid meds for Bipolar  . CAD (coronary artery disease) 07/2016   by CT scan  . Carpal tunnel syndrome    had surgery but occasional still has some issues per patient  . Cataract   . Centrilobular emphysema (HCC) 07/2016   by CT scan - pt not aware of this  . Constipation due to pain medication   . Depression with anxiety   . Diabetes mellitus    type 2 - no meds diet controlled  . GERD (gastroesophageal reflux disease)   . History of blood transfusion   . 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 Hyperlipidemia   . Hypertension   . OSA (obstructive sleep apnea)    no longer using cpap, uses a bed that raises and lowers hob  . Osteoarthritis   . Osteopenia 08/11/2019   DEXA 07/2019 - T -1.1 R femur (osteopenia)  . Pneumonia   . Restless legs   . Septic arthritis (HCC) 10/11/2012  . Shingles 06/30/2016  . Status post revision of total hip replacement  bilateral   prosthetic infection R 2013, L 2015  . Thoracic aortic atherosclerosis (HCC) 11/207   by CT    Patient Active Problem List   Diagnosis Date Noted  . Non-ischemic cardiomyopathy (HCC) 12/29/2019  . Acute HFrEF (heart failure with reduced ejection fraction) (HCC) 11/25/2019  . UTI (urinary tract infection) 11/25/2019  . NSTEMI (non-ST elevated myocardial infarction) (HCC) 11/09/2019  . QT prolongation 11/09/2019  . Acute kidney injury (HCC) 11/04/2019  . Hyponatremia 11/03/2019  . Epigastric abdominal pain 11/03/2019  . Nausea and vomiting 11/03/2019  . Diminished pulses in lower extremity 08/11/2019  . Osteopenia 08/11/2019  . Wound infection complicating hardware (HCC) 04/01/2019  . 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 Bilateral lower extremity edema 11/29/2018  . Neurogenic urinary incontinence 11/29/2018  . Urinary incontinence 11/29/2018  . Bladder spasms 11/29/2018  . Discogenic low back pain 11/29/2018  . Chronic sacroiliac joint pain (Bilateral) (R>L) 11/29/2018  . Spondylosis without myelopathy or radiculopathy, lumbar region 11/09/2018  . Chronic low back pain (Bilateral) (L>R) w/o sciatica 11/09/2018  . History of hip replacement (Left) 10/13/2018  . History of back surgery 10/13/2018  . Vitamin D insufficiency 10/13/2018  .  Osteoarthritis of facet joint of lumbar spine 10/13/2018  . Osteoarthritis involving multiple joints 10/13/2018  . Cervical radiculopathy (Left) 10/13/2018  . Lumbar facet arthropathy (Bilateral) 10/13/2018  . Lumbar facet syndrome (Bilateral) (L>R) 10/13/2018  . Abnormal MRI, lumbar spine (09/16/2018) 10/13/2018  . Lumbar nerve root compression (L4) (Left) 10/13/2018  . Lumbar foraminal stenosis 10/13/2018  . 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 Chronic low back pain (Primary Area of Pain) (Bilateral) (L>R) w/ sciatica (Left)  09/14/2018  . Chronic lower extremity pain (Secondary Area of Pain) (Left) 09/14/2018  . Chronic hip pain [facility] Area of Pain) (Bilateral) (L>R) 09/14/2018  . Chronic knee pain (Fourth Area of Pain) (Bilateral) (R>L) 09/14/2018  . Pharmacologic therapy 09/14/2018  . Disorder of skeletal system 09/14/2018  . Problems influencing health status 09/14/2018  . Primary osteoarthritis of first carpometacarpal joint of left hand 04/20/2018  . 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 01/16/2018  . PAH (pulmonary artery hypertension) (HCC) 08/03/2017  . Advanced care planning/counseling discussion 06/25/2017  . Synovial cyst of lumbar facet joint 11/13/2016  . Thoracic aortic atherosclerosis (HCC)   . CAD (coronary artery disease) 07/30/2016  . Centrilobular emphysema (HCC) 07/30/2016  . Ex-smoker 07/08/2016  . Nonspecific abnormal electrocardiogram (ECG) (EKG) 07/08/2016  . Chronic lumbar radicular pain (L4) (Bilateral) 06/09/2016  . DOE (dyspnea on exertion) 05/22/2016  . Degenerative joint disease (DJD) of hip 04/17/2016  . Somatic dysfunction of sacroiliac joint 04/17/2016  . S/P Lumbar Fusion (L3-4 PLIF) 04/17/2016  . Cervical fusion syndrome 04/17/2016  . Pedal edema 02/19/2016  . Chronic pain syndrome 02/19/2016  . Abdominal aortic atherosclerosis (HCC) 12/11/2015  . MRSA (methicillin resistant Staphylococcus aureus) infection 10/11/2012  . Urinary urgency 07/28/2012  . Infection or inflammatory reaction due to internal joint prosthesis (HCC) 12/03/2011  . Morbid obesity with BMI of 60.0-69.9, adult (HCC) 05/13/2011  . Essential hypertension 05/17/2010  . Cervical radiculopathy (Right) 12/25/2009  . Benign positional vertigo 07/21/2008  . Hyperlipidemia associated with type 2 diabetes mellitus (HCC) 08/16/2007  . Hypothyroidism 07/30/2007  . DM type 2 with diabetic peripheral neuropathy (HCC) 07/30/2007  . Normocytic anemia 07/30/2007  . Bipolar disorder (HCC) 07/30/2007  .  Allergic rhinitis 07/30/2007  . Asthma 07/30/2007  . GERD 07/30/2007  . Osteoarthritis 07/30/2007  . OSA on CPAP 07/30/2007    Past Surgical History:  Procedure Laterality Date  . BREAST CYST ASPIRATION    . CARPAL TUNNEL RELEASE Right 05/15/2011  . CARPAL TUNNEL RELEASE Left 12/24/2017   Procedure: LEFT CARPAL TUNNEL RELEASE;  Surgeon: [name], MD;  Location: [facility];  Service: Orthopedics;  Laterality: Left;  . CERVICAL FUSION  2012   C2/3/4  . COLONOSCOPY WITH PROPOFOL N/A 12/31/2015   diverticulosis, int hem, o/w normal rpt 10 yrs (Rein)  . ESOPHAGOGASTRODUODENOSCOPY (EGD) WITH PROPOFOL N/A 11/12/2019   Procedure: ESOPHAGOGASTRODUODENOSCOPY (EGD) WITH PROPOFOL;  Surgeon: Sherrilyn Rist, MD;  Location: [facility] END[facility];  Service: Gastroenterology;  Laterality: N/A;  . EYE SURGERY Bilateral    cataract surgery with lens implant  . FOOT SURGERY  1982   bone spur  . I & D EXTREMITY  09/06/2012   Budd Palmer, MD; Right;  I&D of right thigh  . I & D EXTREMITY Left 07/2013   wound vac - daily doxycycline indefinitely  . KNEE ARTHROSCOPY  09/01/2012   Dannielle Huh, MD;  Right  . LEFT HEART CATH AND CORONARY  ANGIOGRAPHY N/A 11/10/2019   Procedure: LEFT HEART CATH AND CORONARY ANGIOGRAPHY;  Surgeon: Lennette Bihari, MD;  Location: [facility] INVASIVE CV LAB;  Service: Cardiovascular;  Laterality: N/A;  . LUMBAR FUSION  01/08/2012   L3-4  . LUMBAR FUSION  02/2019   unexpectedly discovered MRSA infection - pus Lovell Sheehan)   . LUMBAR LAMINECTOMY/DECOMPRESSION MICRODISCECTOMY N/A 11/13/2016   LAMINOTOMY/LAMINECTOMY LUMBAR FOUR LUMBAR FIVE  WITH RESECTION OF SYNOVIAL CYST;  Surgeon: Tressie Stalker, MD  . NECK SURGERY     Herniated disk C2,3,4  . NOSE SURGERY    . PARTIAL HYSTERECTOMY  1984   for mennorhagia, ovaries remain  . REVISION TOTAL HIP ARTHROPLASTY Left 08/28/2011  . TMJ ARTHROPLASTY  1982  . TOTAL HIP ARTHROPLASTY Left 2002  . TRIGGER FINGER RELEASE Right  05/15/2011   long finger     OB History   No obstetric history on file.     Family History  Problem Relation Age of Onset  . Aneurysm Father 62       brain  . Alcohol abuse Father   . Cancer Father        possibly  . CAD Other        several siblings  . Cancer Brother        prostate  . Diabetes Brother   . Diabetes Sister   . Anesthesia problems Neg Hx   . Hypotension Neg Hx   . Malignant hyperthermia Neg Hx   . Pseudochol deficiency Neg Hx   . Breast cancer Neg Hx     Social History   Tobacco Use  . Smoking status: Former Smoker    Packs/day: 1.50    Years: 40.00    Pack years: 60.00    Types: Cigarettes    Quit date: 04/29/2009    Years since quitting: 11.0  . Smokeless tobacco: Never Used  Vaping Use  . Vaping Use: Never used  Substance Use Topics  . Alcohol use: Yes    Alcohol/week: 2.0 standard drinks    Types: 2 Shots of liquor per week    Comment: 2 shots/weekly  . Drug use: No    Home Medications Prior to Admission medications   Medication Sig Start Date End Date Taking? Authorizing Provider  albuterol (PROVENTIL HFA;VENTOLIN HFA) 108 (90 Base) MCG/ACT inhaler Inhale 2 puffs into the lungs every 6 (six) hours as needed for wheezing or shortness of breath. 12/14/18  Yes [name], MD  aspirin 81 MG EC tablet Take 81 mg by mouth at bedtime.    Yes [provider]  budesonide-formoterol (SYMBICORT) 160-4.5 MCG/ACT inhaler Inhale 2 puffs into the lungs 2 (two) times daily. 11/07/19  Yes [name], MD  celecoxib (CELEBREX) 200 MG capsule Take 1 capsule (200 mg total) by mouth daily. 01/13/20  Yes [name], MD  levothyroxine (SYNTHROID) 150 MCG tablet TAKE ONE TABLET BY MOUTH AT BEDTIME 03/19/20  Yes [name], MD  metFORMIN (GLUCOPHAGE) 500 MG tablet Take 1 tablet (500 mg total) by mouth every evening. 01/27/20  Yes [name], MD  montelukast (SINGULAIR) 10 MG tablet Take 1 tablet (10 mg total) by mouth at  bedtime. 11/25/19  Yes [name], MD  carvedilol (COREG) 6.25 MG tablet Take 1 tablet (6.25 mg total) by mouth 2 (two) times daily. 12/16/19 01/15/20  [name], MD  dicyclomine (BENTYL) 20 MG tablet Take 1 tablet (20 mg total) by mouth in the morning and at bedtime. 11/21/19   Zehr, Princella Pellegrini, PA-C  docusate sodium (COLACE) 100 MG capsule Take 1 capsule (100 mg total) by mouth 2 (two) times daily. 11/06/19   Myrtie Neither, MD  doxycycline (VIBRA-TABS) 100 MG tablet Take 1 tablet (100 mg total) by mouth 2 (two) times daily. 12/21/19   Judyann Munson, MD  fluconazole (DIFLUCAN) 200 MG tablet Take 1 tablet (200 mg total) by mouth daily. X 5 days as needed for yeast infection 12/21/19   Judyann Munson, MD  FLUoxetine (PROZAC) 40 MG capsule Take by mouth daily. 03/08/19   [provider]  furosemide (LASIX) 20 MG tablet Take 1 tablet (20 mg total) by mouth daily as needed for edema. 02/23/20 03/24/20  Rollene Rotunda, MD  lamoTRIgine (LAMICTAL) 200 MG tablet Take 200 mg by mouth daily.      [provider]  losartan (COZAAR) 50 MG tablet Take 1 tablet (50 mg total) by mouth daily. 12/29/19 03/28/20  Abelino Derrick, PA-C  Multiple Vitamin (MULTIVITAMIN WITH MINERALS) TABS tablet Take 2 tablets by mouth daily.     [provider]  ondansetron (ZOFRAN ODT) 8 MG disintegrating tablet Take 1 tablet (8 mg total) by mouth 2 (two) times daily as needed for nausea or vomiting. 11/14/19   Hughie Closs, MD  Oxcarbazepine (TRILEPTAL) 300 MG tablet Take 300-600 mg by mouth See admin instructions. Take 300 mg by mouth in the morning and 600 mg at night    [provider]  oxybutynin (DITROPAN-XL) 10 MG 24 hr tablet Take 1 tablet (10 mg total) by mouth daily. 11/03/19   Jerilee Field, MD  pantoprazole (PROTONIX) 40 MG tablet Take 1 tablet (40 mg total) by mouth 2 (two) times daily. Patient taking differently: Take 40 mg by mouth daily.  11/25/19   [name], MD    polyethylene glycol (MIRALAX / GLYCOLAX) 17 g packet Take 17 g by mouth 2 (two) times daily as needed for mild constipation. 11/14/19   Hughie Closs, MD  prochlorperazine (COMPAZINE) 10 MG tablet Take 1 tablet (10 mg total) by mouth every 6 (six) hours as needed for nausea or vomiting. 11/21/19   Zehr, Princella Pellegrini, PA-C  promethazine (PHENERGAN) 25 MG suppository Place 1 suppository (25 mg total) rectally every 6 (six) hours as needed for nausea or vomiting. 11/08/19   [name], MD  simvastatin (ZOCOR) 40 MG tablet Take 1 tablet (40 mg total) by mouth every evening. 01/09/20   [name], MD  sucralfate (CARAFATE) 1 GM/10ML suspension Take 10 mLs (1 g total) by mouth 4 (four) times daily -  with meals and at bedtime. 11/18/19   [name], MD  zaleplon (SONATA) 10 MG capsule Take 10 mg by mouth at bedtime as needed for sleep.    [provider]    Allergies    Toviaz [fesoterodine], Cephalexin, Hydrocodone, Entresto [sacubitril-valsartan], Tolterodine tartrate, Risperidone and related, Seroquel [quetiapine fumarate], and Sulfa antibiotics  Review of Systems   Review of Systems  Constitutional: Negative for chills and fever.  HENT: Positive for congestion. Negative for rhinorrhea and sore throat.   Eyes: Negative for visual disturbance.  Respiratory: Positive for shortness of breath. Negative for cough.   Cardiovascular: Negative for chest pain and leg swelling.  Gastrointestinal: Negative for abdominal pain, diarrhea, nausea and vomiting.  Genitourinary: Negative for dysuria.  Musculoskeletal: Negative for back pain and neck pain.  Skin: Negative for rash.  Neurological: Negative for dizziness, light-headedness and headaches.  Hematological: Does not bruise/bleed easily.  Psychiatric/Behavioral: Negative for confusion.    Physical  Exam Updated Vital Signs BP (!) 171/77 (BP Location: Right Wrist)   Pulse 77 Comment: 2L  Temp 98.9 F (37.2 C) (Oral)   Resp  (!) 27 Comment: 2L  Ht 1.626 m ( )   Wt (!) 170.1 kg   LMP  (LMP Unknown)   SpO2 97% Comment: 2L  BMI 64.37 kg/m   Physical Exam Vitals and nursing note reviewed.  Constitutional:      General: She is not in acute distress.    Appearance: She is well-developed. She is obese.  HENT:     Head: Normocephalic and atraumatic.  Eyes:     Conjunctiva/sclera: Conjunctivae normal.     Pupils: Pupils are equal, round, and reactive to light.  Cardiovascular:     Rate and Rhythm: Normal rate and regular rhythm.     Heart sounds: No murmur heard.   Pulmonary:     Effort: Pulmonary effort is normal. No respiratory distress.     Breath sounds: Normal breath sounds. No wheezing.  Abdominal:     Palpations: Abdomen is soft.     Tenderness: There is no abdominal tenderness.  Musculoskeletal:        General: Normal range of motion.     Cervical back: Normal range of motion and neck supple.  Skin:    General: Skin is warm and dry.     Capillary Refill: Capillary refill takes less than 2 seconds.  Neurological:     General: No focal deficit present.     Mental Status: She is alert and oriented to person, place, and time.     Cranial Nerves: No cranial nerve deficit.     Sensory: No sensory deficit.     Motor: No weakness.     ED Results / Procedures / Treatments   Labs (all labs ordered are listed, but only abnormal results are displayed) Labs Reviewed  CBC WITH DIFFERENTIAL/PLATELET - Abnormal; Notable for the following components:      Result Value   Hemoglobin 10.7 (*)    HCT 34.1 (*)    RDW 16.7 (*)    All other components within normal limits  COMPREHENSIVE METABOLIC PANEL - Abnormal; Notable for the following components:   Sodium 134 (*)    Chloride 95 (*)    Glucose, Bld 129 (*)    Calcium 8.8 (*)    AST 58 (*)    ALT 53 (*)    All other components within normal limits  D-DIMER, QUANTITATIVE (NOT AT [facility]) - Abnormal; Notable for the following components:   D-Dimer,  Quant 1.76 (*)    All other components within normal limits  CULTURE, BLOOD (ROUTINE X 2)  CULTURE, BLOOD (ROUTINE X 2)  SARS CORONAVIRUS 2 BY RT PCR (HOSPITAL ORDER, PERFORMED IN [facility])  LACTIC ACID, PLASMA  LACTIC ACID, PLASMA  PROCALCITONIN  LACTATE DEHYDROGENASE  FERRITIN  TRIGLYCERIDES  FIBRINOGEN  C-REACTIVE PROTEIN    EKG EKG Interpretation  Date/Time:  Thursday May 03 2020 16:36:50 EDT Ventricular Rate:  71 PR Interval:    QRS Duration: 144 QT Interval:  457 QTC Calculation: 497 R Axis:   -29 Text Interpretation: Sinus rhythm IVCD, consider atypical LBBB No significant change since last tracing Confirmed by Vanetta Mulders [phone]) on 05/03/2020 4:48:23 PM   Radiology DG Chest Port 1 View  Result Date: 05/03/2020 CLINICAL DATA:  Shortness of breath.  Hypoxia.  COVID-19 positive. EXAM: PORTABLE CHEST 1 VIEW COMPARISON:  Chest radiograph 10/31/2019 and CTA 11/09/2019 FINDINGS:  The cardiac silhouette remains mildly enlarged. Aortic atherosclerosis is noted. There is persistent mild elevation of the right hemidiaphragm. Compared to the prior radiograph, there is increased pulmonary vascular congestion with mild interstitial densities bilaterally. No sizable pleural effusion or pneumothorax is identified. Prior cervical fusion is noted. IMPRESSION: Cardiomegaly with pulmonary vascular congestion and mild bilateral interstitial opacities which may reflect edema or atypical/viral infection. Electronically Signed   By: Sebastian Ache M.D.   On: 05/03/2020 15:42    Procedures Procedures (including critical care time) CRITICAL CARE Performed by: Vanetta Mulders Total critical care time: 45 minutes Critical care time was exclusive of separately billable procedures and treating other patients. Critical care was necessary to treat or prevent imminent or life-threatening deterioration. Critical care was time spent personally by me on the following activities:  development of treatment plan with patient and/or surrogate as well as nursing, discussions with consultants, evaluation of patient's response to treatment, examination of patient, obtaining history from patient or surrogate, ordering and performing treatments and interventions, ordering and review of laboratory studies, ordering and review of radiographic studies, pulse oximetry and re-evaluation of patient's condition.   Medications Ordered in ED Medications - No data to display  ED Course  I have reviewed the triage vital signs and the nursing notes.  Pertinent labs & imaging results that were available during my care of the patient were reviewed by me and considered in my medical decision making (see chart for details).    MDM Rules/Calculators/A&P                         Patient is oxygen saturations on room air was 84%.  She was placed on 3 L but her oxygen up to 97%.  Dialed back down to 2 L.  No fever.  Chest x-ray showing early bilateral fluffy infiltrates suggestive of a viral pneumonia.  Patient will be retested for Covid since we do not have a positive test in the system.  Patient will require admission because of the hypoxia.  Patient had Covid panel lab test ordered.  Covid test formally positive here.  Patient does have an elevated D-dimer but I think this is secondary more to the infection.  Patient on 2 L of oxygen and has good oxygen saturation without tachypnea.  No clinical concern for DVT or PE.   Patient with any kind of movement is desat.  When they rolled her oxygen saturations went down.  Oxygen increased to 6 L patient with some wheezing so will be given albuterol inhaler.  Final Clinical Impression(s) / ED Diagnoses Final diagnoses:  COVID-19 virus infection  Hypoxia  Pneumonia due to COVID-19 virus    Rx / DC Orders ED Discharge Orders    None       Vanetta Mulders, MD 05/03/20 1754    Vanetta Mulders, MD 05/03/20 [phone]

## 2020-05-03 NOTE — ED Notes (Signed)
Patient HFNC increased to 7L. Oxygen saturations decrease with small movements in bed. Patient repositioned in bed to sit up further to distribute better oxygenation. Patient oxygen saturations are 91-97% depending on patients' movements. Will continue to monitor as needed. Patient tolerated well.

## 2020-05-03 NOTE — Procedures (Signed)
Instructed patient on the proper use of administering an Accapella device. Patient instructed to use 10X per 1hr. Patient able to demonstrate X 10 without difficulty. Patient tolerated well.

## 2020-05-03 NOTE — Telephone Encounter (Signed)
Noted  

## 2020-05-03 NOTE — Telephone Encounter (Signed)
BIPAP orders faxed to [facility].

## 2020-05-03 NOTE — ED Notes (Signed)
Pt had urine leak from around external urinary catheter. While changing pad and pt's underwear, pt became extremely labored and anxious. Repositioned pt upright and increased O2 to 3L SpO2 between 89-90% at this time.

## 2020-05-03 NOTE — ED Triage Notes (Signed)
Pt arrives with SOB home O2 reading of 80% RA. Pt states that she tested positive for Covid yesterday.

## 2020-05-03 NOTE — Telephone Encounter (Signed)
Spoke with patient, did discuss that with O2 sat down to 84% rec in person evaluation at ER, reviewed reasoning behind recommendation. Pt and daughter agree.  plz call tomorrow for update.

## 2020-05-03 NOTE — Telephone Encounter (Signed)
[facility] Day - Client TELEPHONE ADVICE RECORD [facility] Patient Name: Amy Barnes Gender: Female DOB: 04-Jan-1951 Age: 69 Y 10 M 27 D Return Phone Number: [phone] (Primary) Address: City/State/Zip: [place] [address] Client [facility] - Client Client Site [facility] - Day Physician Amy Barnes - MD Contact Type Call Who Is Calling Patient / Member / Family / Caregiver Call Type Triage / Clinical Caller Name Amy Barnes Relationship To Patient Daughter Return Phone Number [phone] (Primary) Chief Complaint BREATHING - shortness of breath or sounds breathless Reason for Call Symptomatic / Request for Health Information Initial Comment Caller states her mom started showing symptoms on Tuesday, congestion, headaches, no fever, but she has congestion. Caller states she does have asthma and they are concerned that her breathing may worsen over the weekend. Translation No Nurse Assessment Nurse: Amy Mt, RN, Nicholaus Bloom Date/Time (Eastern Time): 05/03/2020 10:54:25 AM Confirm and document reason for call. If symptomatic, describe symptoms. ---Caller states her mom started showing symptoms on Tuesday, congestion, headaches, no fever. Caller states she does have asthma and they are concerned that her breathing may worsen over the weekend. She has tested positive for covid. No trouble breathing at present. Cough is productive with light yellow sputum. Headache is 3/10. Has the patient had close contact with a person known or suspected to have the novel coronavirus illness OR traveled / lives in area with major community spread (including international travel) in the last 14 days from the onset of symptoms? * If Asymptomatic, screen for exposure and travel within the last 14 days. ---Yes Does the patient have any new or worsening symptoms? ---Yes Will a triage be completed? ---Yes Related  visit to physician within the last 2 weeks? ---No Does the PT have any chronic conditions? (i.Barnes. diabetes, asthma, this includes High risk factors for pregnancy, etc.) ---Yes List chronic conditions. ---asthma, diabetes, HTN, heart Is this a behavioral health or substance abuse call? ---No PLEASE NOTE: All timestamps contained within this report are represented as [place] Standard Time. CONFIDENTIALTY NOTICE: This fax transmission is intended only for the addressee. It contains information that is legally privileged, confidential or otherwise protected from use or disclosure. If you are not the intended recipient, you are strictly prohibited from reviewing, disclosing, copying using or disseminating any of this information or taking any action in reliance on or regarding this information. If you have received this fax in error, please notify us immediately by telephone so that we can arrange for its return to [place]. Phone: [phone], Toll-Free: [phone], Fax: [phone] Page: 2 of 3 Call Id: [id number] Guidelines Guideline Title Affirmed Question Affirmed Notes Nurse Date/Time Lamount Cohen Time) COVID-19 - Diagnosed or Suspected MILD difficulty breathing (Barnes.g., minimal/no SOB at rest, SOB with walking, pulse <100) [place], RN, Nicholaus Bloom 05/03/2020 10:57:08 AM Asthma Attack [1] MILD asthma attack (Barnes.g., no SOB at rest, mild SOB with walking, speaks normally in sentences, mild wheezing) AND [2] persists > 24 hours on appropriate treatment Amy Barnes, RNNicholaus Bloom 05/03/2020 10:59:08 AM Disp. Time Lamount Cohen Time) Disposition Final User 05/03/2020 10:52:45 AM Send to Urgent Darleen Crocker, Dawn 05/03/2020 10:58:50 AM Go to ED Now (or PCP triage) Amy Mt, RN, Nicholaus Bloom 05/03/2020 11:03:48 AM See PCP within 24 Hours Yes Amy Mt, RN, Prentiss Bells Disagree/Comply Comply Caller Understands Yes PreDisposition Call Doctor Care Advice Given Per Guideline GO TO ED NOW (OR PCP TRIAGE): * IF PCP SECOND-LEVEL TRIAGE  REQUIRED: You may need to be seen.  Your doctor (or NP/PA) will want to talk with you to decide what's best. I'll page the provider on-call now. If you haven't heard from the provider (or me) within 30 minutes, go directly to the ED/UCC at _____________ Hospital. GENERAL CARE ADVICE FOR COVID-19 SYMPTOMS: * The treatment is the same whether you have COVID-19, influenza or some other respiratory virus. * Cough: Use cough drops. * Feeling dehydrated: Drink extra liquids. If the air in your home is dry, use a humidifier. * Fever: For fever over 101 F (38.3 C), take acetaminophen every 4 to 6 hours (Adults 650 mg) OR ibuprofen every 6-8 hours (Adults 400 mg). Before taking any medicine, read all the instructions on the package. Do not take aspirin unless your doctor has prescribed it for you. * Muscle aches, headache, and other pains: Often this comes and goes with the fever. Take acetaminophen every 4-6 hours (Adults 650 mg) OR ibuprofen every 6 to 8 hours (Adults 400 mg). Before taking any medicine, read all the instructions on the package. * Sore throat: Try throat lozenges, hard candy or warm chicken broth. CARE ADVICE given per COVID-19 - DIAGNOSED OR SUSPECTED (Adult) guideline. SEE PCP WITHIN 24 HOURS: * IF OFFICE WILL BE OPEN: You need to be examined within the next 24 hours. Call your doctor (or NP/PA) when the office opens and make an appointment. ASTHMA QUICK-RELIEF MEDICINE: * Start your [place] medicine (Barnes.g., albuterol, salbutamol) at the first sign of any coughing or shortness of breath (don't wait for wheezing). * Use inhaler (2 puffs each time) or nebulizer every 4 hours. DRINKING FLUIDS AND USING A HUMIDIFIER: * Drink a normal amount of liquids (Barnes.g., water). Being adequately hydrated makes it easier to cough up the sticky lung mucus. * If the air is dry, use a humidifier to prevent drying of the upper airway. CARE ADVICE given per Asthma Attack (Adult) guideline. * You become  worse. * Wheezing is not completely cleared by 5 days * Inhaled asthma medicine (neb or MDI) is needed more often than every 4 hours * Wheezing is not improved after neb or inhaler CALL BACK IF: PLEASE NOTE: All timestamps contained within this report are represented as [place] Standard Time. CONFIDENTIALTY NOTICE: This fax transmission is intended only for the addressee. It contains information that is legally privileged, confidential or otherwise protected from use or disclosure. If you are not the intended recipient, you are strictly prohibited from reviewing, disclosing, copying using or disseminating any of this information or taking any action in reliance on or regarding this information. If you have received this fax in error, please notify us immediately by telephone so that we can arrange for its return to [place]. Phone: [phone], Toll-Free: [phone], Fax: [phone] Page: 3 of 3 Call Id: [id number] Referrals Warm transfer to backline

## 2020-05-04 ENCOUNTER — Encounter (HOSPITAL_COMMUNITY): Payer: Self-pay | Admitting: Internal Medicine

## 2020-05-04 ENCOUNTER — Ambulatory Visit: Payer: Self-pay | Admitting: *Deleted

## 2020-05-04 DIAGNOSIS — E039 Hypothyroidism, unspecified: Secondary | ICD-10-CM | POA: Diagnosis present

## 2020-05-04 DIAGNOSIS — F418 Other specified anxiety disorders: Secondary | ICD-10-CM | POA: Diagnosis present

## 2020-05-04 DIAGNOSIS — E662 Morbid (severe) obesity with alveolar hypoventilation: Secondary | ICD-10-CM | POA: Diagnosis not present

## 2020-05-04 DIAGNOSIS — G4733 Obstructive sleep apnea (adult) (pediatric): Secondary | ICD-10-CM

## 2020-05-04 DIAGNOSIS — J309 Allergic rhinitis, unspecified: Secondary | ICD-10-CM | POA: Diagnosis present

## 2020-05-04 DIAGNOSIS — E1142 Type 2 diabetes mellitus with diabetic polyneuropathy: Secondary | ICD-10-CM

## 2020-05-04 DIAGNOSIS — E785 Hyperlipidemia, unspecified: Secondary | ICD-10-CM

## 2020-05-04 DIAGNOSIS — Z6841 Body Mass Index (BMI) 40.0 and over, adult: Secondary | ICD-10-CM

## 2020-05-04 DIAGNOSIS — I10 Essential (primary) hypertension: Secondary | ICD-10-CM | POA: Diagnosis not present

## 2020-05-04 DIAGNOSIS — U071 COVID-19: Principal | ICD-10-CM

## 2020-05-04 DIAGNOSIS — F310 Bipolar disorder, current episode hypomanic: Secondary | ICD-10-CM | POA: Diagnosis not present

## 2020-05-04 DIAGNOSIS — F3110 Bipolar disorder, current episode manic without psychotic features, unspecified: Secondary | ICD-10-CM | POA: Diagnosis not present

## 2020-05-04 DIAGNOSIS — G5600 Carpal tunnel syndrome, unspecified upper limb: Secondary | ICD-10-CM | POA: Diagnosis present

## 2020-05-04 DIAGNOSIS — I517 Cardiomegaly: Secondary | ICD-10-CM

## 2020-05-04 DIAGNOSIS — J1282 Pneumonia due to coronavirus disease 2019: Secondary | ICD-10-CM

## 2020-05-04 DIAGNOSIS — B964 Proteus (mirabilis) (morganii) as the cause of diseases classified elsewhere: Secondary | ICD-10-CM | POA: Diagnosis present

## 2020-05-04 DIAGNOSIS — R0902 Hypoxemia: Secondary | ICD-10-CM

## 2020-05-04 DIAGNOSIS — G894 Chronic pain syndrome: Secondary | ICD-10-CM | POA: Diagnosis present

## 2020-05-04 DIAGNOSIS — J9601 Acute respiratory failure with hypoxia: Secondary | ICD-10-CM

## 2020-05-04 DIAGNOSIS — F319 Bipolar disorder, unspecified: Secondary | ICD-10-CM | POA: Diagnosis present

## 2020-05-04 DIAGNOSIS — E1169 Type 2 diabetes mellitus with other specified complication: Secondary | ICD-10-CM | POA: Diagnosis not present

## 2020-05-04 DIAGNOSIS — Z66 Do not resuscitate: Secondary | ICD-10-CM | POA: Diagnosis present

## 2020-05-04 DIAGNOSIS — E7800 Pure hypercholesterolemia, unspecified: Secondary | ICD-10-CM | POA: Diagnosis not present

## 2020-05-04 DIAGNOSIS — G2581 Restless legs syndrome: Secondary | ICD-10-CM | POA: Diagnosis present

## 2020-05-04 DIAGNOSIS — I119 Hypertensive heart disease without heart failure: Secondary | ICD-10-CM | POA: Diagnosis present

## 2020-05-04 DIAGNOSIS — R197 Diarrhea, unspecified: Secondary | ICD-10-CM | POA: Diagnosis present

## 2020-05-04 DIAGNOSIS — N390 Urinary tract infection, site not specified: Secondary | ICD-10-CM | POA: Diagnosis present

## 2020-05-04 DIAGNOSIS — Z96643 Presence of artificial hip joint, bilateral: Secondary | ICD-10-CM | POA: Diagnosis present

## 2020-05-04 DIAGNOSIS — J440 Chronic obstructive pulmonary disease with acute lower respiratory infection: Secondary | ICD-10-CM | POA: Diagnosis present

## 2020-05-04 DIAGNOSIS — J9611 Chronic respiratory failure with hypoxia: Secondary | ICD-10-CM | POA: Diagnosis present

## 2020-05-04 DIAGNOSIS — I2510 Atherosclerotic heart disease of native coronary artery without angina pectoris: Secondary | ICD-10-CM | POA: Diagnosis present

## 2020-05-04 DIAGNOSIS — Z9981 Dependence on supplemental oxygen: Secondary | ICD-10-CM | POA: Diagnosis present

## 2020-05-04 DIAGNOSIS — M85851 Other specified disorders of bone density and structure, right thigh: Secondary | ICD-10-CM | POA: Diagnosis present

## 2020-05-04 LAB — FERRITIN: Ferritin: 91 ng/mL (ref 11–307)

## 2020-05-04 LAB — CBC WITH DIFFERENTIAL/PLATELET
Abs Immature Granulocytes: 0.03 10*3/uL (ref 0.00–0.07)
Basophils Absolute: 0 10*3/uL (ref 0.0–0.1)
Basophils Relative: 0 %
Eosinophils Absolute: 0 10*3/uL (ref 0.0–0.5)
Eosinophils Relative: 0 %
HCT: 34.8 % — ABNORMAL LOW (ref 36.0–46.0)
Hemoglobin: 10.4 g/dL — ABNORMAL LOW (ref 12.0–15.0)
Immature Granulocytes: 1 %
Lymphocytes Relative: 17 %
Lymphs Abs: 1.1 10*3/uL (ref 0.7–4.0)
MCH: 27.2 pg (ref 26.0–34.0)
MCHC: 29.9 g/dL — ABNORMAL LOW (ref 30.0–36.0)
MCV: 91.1 fL (ref 80.0–100.0)
Monocytes Absolute: 0.6 10*3/uL (ref 0.1–1.0)
Monocytes Relative: 10 %
Neutro Abs: 4.7 10*3/uL (ref 1.7–7.7)
Neutrophils Relative %: 72 %
Platelets: 163 10*3/uL (ref 150–400)
RBC: 3.82 MIL/uL — ABNORMAL LOW (ref 3.87–5.11)
RDW: 16.8 % — ABNORMAL HIGH (ref 11.5–15.5)
WBC: 6.5 10*3/uL (ref 4.0–10.5)
nRBC: 0 % (ref 0.0–0.2)

## 2020-05-04 LAB — PHOSPHORUS: Phosphorus: 4.2 mg/dL (ref 2.5–4.6)

## 2020-05-04 LAB — GLUCOSE, CAPILLARY
Glucose-Capillary: 170 mg/dL — ABNORMAL HIGH (ref 70–99)
Glucose-Capillary: 165 mg/dL — ABNORMAL HIGH (ref 70–99)
Glucose-Capillary: 174 mg/dL — ABNORMAL HIGH (ref 70–99)
Glucose-Capillary: 140 mg/dL — ABNORMAL HIGH (ref 70–99)
Glucose-Capillary: 182 mg/dL — ABNORMAL HIGH (ref 70–99)

## 2020-05-04 LAB — COMPREHENSIVE METABOLIC PANEL
ALT: 60 U/L — ABNORMAL HIGH (ref 0–44)
AST: 70 U/L — ABNORMAL HIGH (ref 15–41)
Albumin: 3.5 g/dL (ref 3.5–5.0)
Alkaline Phosphatase: 80 U/L (ref 38–126)
Anion gap: 14 (ref 5–15)
BUN: 14 mg/dL (ref 8–23)
CO2: 21 mmol/L — ABNORMAL LOW (ref 22–32)
Calcium: 8.6 mg/dL — ABNORMAL LOW (ref 8.9–10.3)
Chloride: 99 mmol/L (ref 98–111)
Creatinine, Ser: 0.7 mg/dL (ref 0.44–1.00)
GFR calc Af Amer: >60 mL/min (ref >60)
GFR calc non Af Amer: >60 mL/min (ref >60)
Glucose, Bld: 151 mg/dL — ABNORMAL HIGH (ref 70–99)
Potassium: 5 mmol/L (ref 3.5–5.1)
Sodium: 134 mmol/L — ABNORMAL LOW (ref 135–145)
Total Bilirubin: 0.6 mg/dL (ref 0.3–1.2)
Total Protein: 6.4 g/dL — ABNORMAL LOW (ref 6.5–8.1)

## 2020-05-04 LAB — LACTATE DEHYDROGENASE: LDH: 284 U/L — ABNORMAL HIGH (ref 98–192)

## 2020-05-04 LAB — HEMOGLOBIN A1C
Hgb A1c MFr Bld: 7 % — ABNORMAL HIGH (ref 4.8–5.6)
Mean Plasma Glucose: 154.2 mg/dL

## 2020-05-04 LAB — C-REACTIVE PROTEIN: CRP: 9.3 mg/dL — ABNORMAL HIGH (ref <1.0)

## 2020-05-04 LAB — TROPONIN I (HIGH SENSITIVITY)
Troponin I (High Sensitivity): 84 ng/L — ABNORMAL HIGH (ref <18)
Troponin I (High Sensitivity): 56 ng/L — ABNORMAL HIGH (ref <18)

## 2020-05-04 LAB — MAGNESIUM: Magnesium: 1.9 mg/dL (ref 1.7–2.4)

## 2020-05-04 LAB — LACTIC ACID, PLASMA
Lactic Acid, Venous: 1.1 mmol/L (ref 0.5–1.9)
Lactic Acid, Venous: 1.4 mmol/L (ref 0.5–1.9)

## 2020-05-04 LAB — CBG MONITORING, ED: Glucose-Capillary: 184 mg/dL — ABNORMAL HIGH (ref 70–99)

## 2020-05-04 MED ORDER — SODIUM CHLORIDE 0.9 % IV SOLN: 100.0000 mg | Freq: Every day | INTRAVENOUS | Status: DC

## 2020-05-04 MED ORDER — IPRATROPIUM-ALBUTEROL 20-100 MCG/ACT IN AERS
1.0000 | INHALATION_SPRAY | Freq: Two times a day (BID) | RESPIRATORY_TRACT | Status: DC
  Administered 2020-05-04 – 2020-05-08 (×8): 1 via RESPIRATORY_TRACT
  Filled 2020-05-04: qty 4

## 2020-05-04 MED ORDER — GUAIFENESIN-DM 100-10 MG/5ML PO SYRP: 10.0000 mL | ORAL_SOLUTION | ORAL | Status: DC | PRN

## 2020-05-04 MED ORDER — SENNOSIDES-DOCUSATE SODIUM 8.6-50 MG PO TABS: 1.0000 | ORAL_TABLET | Freq: Every evening | ORAL | Status: DC | PRN

## 2020-05-04 MED ORDER — ACETAMINOPHEN 325 MG PO TABS
650.0000 mg | ORAL_TABLET | Freq: Four times a day (QID) | ORAL | Status: DC | PRN
  Administered 2020-05-07 (×2): 650 mg via ORAL
  Filled 2020-05-04 (×2): qty 2

## 2020-05-04 MED ORDER — SODIUM CHLORIDE 0.9 % IV SOLN: 200.0000 mg | Freq: Once | INTRAVENOUS | Status: DC

## 2020-05-04 MED ORDER — SIMVASTATIN 20 MG PO TABS
40.0000 mg | ORAL_TABLET | Freq: Every evening | ORAL | Status: DC
  Administered 2020-05-04 – 2020-05-07 (×4): 40 mg via ORAL
  Filled 2020-05-04 (×4): qty 2

## 2020-05-04 MED ORDER — ENOXAPARIN SODIUM 40 MG/0.4ML ~~LOC~~ SOLN: 40.0000 mg | SUBCUTANEOUS | Status: DC

## 2020-05-04 MED ORDER — IPRATROPIUM-ALBUTEROL 20-100 MCG/ACT IN AERS
1.0000 | INHALATION_SPRAY | Freq: Four times a day (QID) | RESPIRATORY_TRACT | Status: DC
  Administered 2020-05-04: 1 via RESPIRATORY_TRACT
  Filled 2020-05-04: qty 4

## 2020-05-04 MED ORDER — LOSARTAN POTASSIUM 50 MG PO TABS
50.0000 mg | ORAL_TABLET | Freq: Every day | ORAL | Status: DC
  Administered 2020-05-04 – 2020-05-06 (×3): 50 mg via ORAL
  Filled 2020-05-04 (×3): qty 1

## 2020-05-04 MED ORDER — OXCARBAZEPINE 300 MG PO TABS
600.0000 mg | ORAL_TABLET | Freq: Every day | ORAL | Status: DC
  Administered 2020-05-04 – 2020-05-07 (×4): 600 mg via ORAL
  Filled 2020-05-04: qty 2
  Filled 2020-05-04: qty 1
  Filled 2020-05-04 (×4): qty 2

## 2020-05-04 MED ORDER — ASPIRIN EC 81 MG PO TBEC
81.0000 mg | DELAYED_RELEASE_TABLET | Freq: Every day | ORAL | Status: DC
  Administered 2020-05-04 – 2020-05-08 (×5): 81 mg via ORAL
  Filled 2020-05-04 (×5): qty 1

## 2020-05-04 MED ORDER — FLUOXETINE HCL 20 MG PO CAPS
40.0000 mg | ORAL_CAPSULE | Freq: Every day | ORAL | Status: DC
  Administered 2020-05-04 – 2020-05-08 (×5): 40 mg via ORAL
  Filled 2020-05-04 (×5): qty 2

## 2020-05-04 MED ORDER — METHYLPREDNISOLONE SODIUM SUCC 125 MG IJ SOLR
0.5000 mg/kg | Freq: Two times a day (BID) | INTRAMUSCULAR | Status: DC
  Administered 2020-05-04 – 2020-05-08 (×9): 86.875 mg via INTRAVENOUS
  Filled 2020-05-04 (×9): qty 2

## 2020-05-04 MED ORDER — OXCARBAZEPINE 300 MG PO TABS
300.0000 mg | ORAL_TABLET | Freq: Every day | ORAL | Status: DC
  Administered 2020-05-05 – 2020-05-08 (×4): 300 mg via ORAL
  Filled 2020-05-04 (×4): qty 1

## 2020-05-04 MED ORDER — SALINE SPRAY 0.65 % NA SOLN
1.0000 | NASAL | Status: DC | PRN
  Filled 2020-05-04: qty 44

## 2020-05-04 MED ORDER — LAMOTRIGINE 100 MG PO TABS
200.0000 mg | ORAL_TABLET | Freq: Every day | ORAL | Status: DC
  Administered 2020-05-04 – 2020-05-08 (×5): 200 mg via ORAL
  Filled 2020-05-04 (×5): qty 2

## 2020-05-04 MED ORDER — ZINC SULFATE 220 (50 ZN) MG PO CAPS: 220.0000 mg | ORAL_CAPSULE | Freq: Every day | ORAL | Status: DC

## 2020-05-04 MED ORDER — INSULIN ASPART 100 UNIT/ML ~~LOC~~ SOLN
0.0000 [IU] | SUBCUTANEOUS | Status: DC
  Administered 2020-05-04: 2 [IU] via SUBCUTANEOUS
  Administered 2020-05-04 – 2020-05-05 (×5): 3 [IU] via SUBCUTANEOUS

## 2020-05-04 MED ORDER — ASCORBIC ACID 500 MG PO TABS
500.0000 mg | ORAL_TABLET | Freq: Every day | ORAL | Status: DC
Start: 2020-05-04 — End: 2020-05-04

## 2020-05-04 NOTE — H&P (Signed)
History and Physical    Amy Barnes AOZ:[id number] DOB: 1951/07/20 DOA: 05/03/2020  PCP: [name], MD   Patient coming from:  Home  Chief Complaint: SOB, fever  HPI: Amy Barnes is a 69 y.o. female with medical history significant for DMT2, OSA, HTN, allergic rhinitis, hypothyroidism, COPD, bipolar disorder, HLD, who presented to [facility] on August 5 for evaluation of shortness of breath with subjective fever.  She reports that she was not feeling well for 2 to 3 days before she went to the emergency room and had progression of symptoms over the previous few days.  Stated she a home COVID-19 test that she got at the pharmacy which came back positive.  She reports that she was checking her pulse oximetry at home and it would drop to approximately 80% with her shortness of breath.  In the emergency room Covid swab was positive.  Patient required supplemental oxygen to maintain O2 sat greater than 92%.  She was started on remdesivir and Solu-Medrol was transferred to [facility] for further management.  She reports that she had subjective fever and chills but no nausea or vomiting.  She reports that family members in her house have also been sick with cough congestion and fever.  She has not had the COVID-19 vaccine.  Review of Systems:  General: Reports weakness, fever, chills. Denies weight loss, night sweats.  Denies dizziness.  Reports decrease in appetite HENT: Denies head trauma, headache, denies change in hearing, tinnitus.  Denies nasal bleeding.  Denies sore throat, sores in mouth.  Denies difficulty swallowing Eyes: Denies blurry vision, pain in eye, drainage.  Denies discoloration of eyes. Neck: Denies pain.  Denies swelling.  Denies pain with movement. Cardiovascular: Reports chest pain intermittently. Denies palpitations.  Denies edema.  Denies orthopnea Respiratory: Reports shortness of breath, cough.  Denies wheezing.  Denies sputum production Gastrointestinal: Denies abdominal  pain, swelling.  Denies nausea, vomiting, diarrhea.  Denies melena.  Denies hematemesis. Musculoskeletal: Denies limitation of movement.  Denies deformity or swelling.  Denies pain.  Denies arthralgias or myalgias. Genitourinary: Denies pelvic pain.  Denies urinary frequency or hesitancy.  Denies dysuria.  Skin: Denies rash.  Denies petechiae, purpura, ecchymosis. Neurological: Denies headache.  Denies syncope.  Denies seizure activity.  Denies weakness or paresthesia.  Denies slurred speech, drooping face.  Denies visual change. Psychiatric: Denies depression, anxiety.  Denies suicidal thoughts or ideation.  Denies hallucinations.  Past Medical History:  Diagnosis Date  . Allergic rhinitis   . Ambulates with cane    straight  . Anemia   . Anxiety   . Asthma    seasonal  . Bipolar affective disorder (HCC)    takes Synthroid meds for Bipolar  . CAD (coronary artery disease) 07/2016   by CT scan  . Carpal tunnel syndrome    had surgery but occasional still has some issues per patient  . Cataract   . Centrilobular emphysema (HCC) 07/2016   by CT scan - pt not aware of this  . Constipation due to pain medication   . Depression with anxiety   . Diabetes mellitus    type 2 - no meds diet controlled  . GERD (gastroesophageal reflux disease)   . History of blood transfusion   . 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 Hyperlipidemia   . Hypertension   . Hypothyroidism   . OSA (obstructive sleep apnea)    no longer using cpap, uses  a bed that raises and lowers hob  . Osteoarthritis   . Osteopenia 08/11/2019   DEXA 07/2019 - T -1.1 R femur (osteopenia)  . Pneumonia   . Restless legs   . Septic arthritis (HCC) 10/11/2012  . Shingles 06/30/2016  . Status post revision of total hip replacement bilateral   prosthetic infection R 2013, L 2015  . Thoracic aortic atherosclerosis (HCC) 11/207   by CT    Past Surgical History:  Procedure Laterality Date    . BREAST CYST ASPIRATION    . CARPAL TUNNEL RELEASE Right 05/15/2011  . CARPAL TUNNEL RELEASE Left 12/24/2017   Procedure: LEFT CARPAL TUNNEL RELEASE;  Surgeon: [name], MD;  Location: [facility];  Service: Orthopedics;  Laterality: Left;  . CERVICAL FUSION  2012   C2/3/4  . COLONOSCOPY WITH PROPOFOL N/A 12/31/2015   diverticulosis, int hem, o/w normal rpt 10 yrs (Rein)  . ESOPHAGOGASTRODUODENOSCOPY (EGD) WITH PROPOFOL N/A 11/12/2019   Procedure: ESOPHAGOGASTRODUODENOSCOPY (EGD) WITH PROPOFOL;  Surgeon: Sherrilyn Rist, MD;  Location: [facility] ENDOSCOPY;  Service: Gastroenterology;  Laterality: N/A;  . EYE SURGERY Bilateral    cataract surgery with lens implant  . FOOT SURGERY  1982   bone spur  . I & D EXTREMITY  09/06/2012   Budd Palmer, MD; Right;  I&D of right thigh  . I & D EXTREMITY Left 07/2013   wound vac - daily doxycycline indefinitely  . KNEE ARTHROSCOPY  09/01/2012   Dannielle Huh, MD;  Right  . LEFT HEART CATH AND CORONARY ANGIOGRAPHY N/A 11/10/2019   Procedure: LEFT HEART CATH AND CORONARY ANGIOGRAPHY;  Surgeon: Lennette Bihari, MD;  Location: [facility] INVASIVE CV LAB;  Service: Cardiovascular;  Laterality: N/A;  . LUMBAR FUSION  01/08/2012   L3-4  . LUMBAR FUSION  02/2019   unexpectedly discovered MRSA infection - pus Lovell Sheehan)   . LUMBAR LAMINECTOMY/DECOMPRESSION MICRODISCECTOMY N/A 11/13/2016   LAMINOTOMY/LAMINECTOMY LUMBAR FOUR LUMBAR FIVE  WITH RESECTION OF SYNOVIAL CYST;  Surgeon: Tressie Stalker, MD  . NECK SURGERY     Herniated disk C2,3,4  . NOSE SURGERY    . PARTIAL HYSTERECTOMY  1984   for mennorhagia, ovaries remain  . REVISION TOTAL HIP ARTHROPLASTY Left 08/28/2011  . TMJ ARTHROPLASTY  1982  . TOTAL HIP ARTHROPLASTY Left 2002  . TRIGGER FINGER RELEASE Right 05/15/2011   long finger    Social History  reports that she quit smoking about 11 years ago. Her smoking use included cigarettes. She has a 60.00 pack-year smoking history. She has never  used smokeless tobacco. She reports current alcohol use of about 2.0 standard drinks of alcohol per week. She reports that she does not use drugs.  Allergies  Allergen Reactions  . Gala Murdoch [Fesoterodine] Nausea And Vomiting    Severe reaction - s/p several ER visits then hospitalization ?stress induced cardiomyopathy  . Cephalexin Hives  . Hydrocodone Other (See Comments)    Reaction:  Hallucinations   . Entresto [Sacubitril-Valsartan]   . Tolterodine Tartrate Nausea And Vomiting  . Risperidone And Related Other (See Comments)    Reaction:  Made pt excessively sleepy  . Seroquel [Quetiapine Fumarate] Other (See Comments)    Reaction:  Made pt excessively sleepy  . Sulfa Antibiotics Rash    Family History  Problem Relation Age of Onset  . Aneurysm Father 62       brain  . Alcohol abuse Father   . Cancer Father  possibly  . CAD Other        several siblings  . Cancer Brother        prostate  . Diabetes Brother   . Diabetes Sister   . Anesthesia problems Neg Hx   . Hypotension Neg Hx   . Malignant hyperthermia Neg Hx   . Pseudochol deficiency Neg Hx   . Breast cancer Neg Hx      Prior to Admission medications   Medication Sig Start Date End Date Taking? Authorizing Provider  albuterol (PROVENTIL HFA;VENTOLIN HFA) 108 (90 Base) MCG/ACT inhaler Inhale 2 puffs into the lungs every 6 (six) hours as needed for wheezing or shortness of breath. 12/14/18  Yes [name], MD  aspirin 81 MG EC tablet Take 81 mg by mouth at bedtime.    Yes [provider]  budesonide-formoterol (SYMBICORT) 160-4.5 MCG/ACT inhaler Inhale 2 puffs into the lungs 2 (two) times daily. 11/07/19  Yes [name], MD  celecoxib (CELEBREX) 200 MG capsule Take 1 capsule (200 mg total) by mouth daily. 01/13/20  Yes [name], MD  levothyroxine (SYNTHROID) 150 MCG tablet TAKE ONE TABLET BY MOUTH AT BEDTIME 03/19/20  Yes [name], MD  metFORMIN (GLUCOPHAGE) 500 MG tablet Take  1 tablet (500 mg total) by mouth every evening. 01/27/20  Yes [name], MD  montelukast (SINGULAIR) 10 MG tablet Take 1 tablet (10 mg total) by mouth at bedtime. 11/25/19  Yes [name], MD  carvedilol (COREG) 6.25 MG tablet Take 1 tablet (6.25 mg total) by mouth 2 (two) times daily. 12/16/19 01/15/20  [name], MD  dicyclomine (BENTYL) 20 MG tablet Take 1 tablet (20 mg total) by mouth in the morning and at bedtime. 11/21/19   Zehr, Princella Pellegrini, PA-C  docusate sodium (COLACE) 100 MG capsule Take 1 capsule (100 mg total) by mouth 2 (two) times daily. 11/06/19   Myrtie Neither, MD  doxycycline (VIBRA-TABS) 100 MG tablet Take 1 tablet (100 mg total) by mouth 2 (two) times daily. 12/21/19   Judyann Munson, MD  fluconazole (DIFLUCAN) 200 MG tablet Take 1 tablet (200 mg total) by mouth daily. X 5 days as needed for yeast infection 12/21/19   Judyann Munson, MD  FLUoxetine (PROZAC) 40 MG capsule Take by mouth daily. 03/08/19   [provider]  furosemide (LASIX) 20 MG tablet Take 1 tablet (20 mg total) by mouth daily as needed for edema. 02/23/20 03/24/20  Rollene Rotunda, MD  lamoTRIgine (LAMICTAL) 200 MG tablet Take 200 mg by mouth daily.      [provider]  losartan (COZAAR) 50 MG tablet Take 1 tablet (50 mg total) by mouth daily. 12/29/19 03/28/20  Abelino Derrick, PA-C  Multiple Vitamin (MULTIVITAMIN WITH MINERALS) TABS tablet Take 2 tablets by mouth daily.     [provider]  ondansetron (ZOFRAN ODT) 8 MG disintegrating tablet Take 1 tablet (8 mg total) by mouth 2 (two) times daily as needed for nausea or vomiting. 11/14/19   Hughie Closs, MD  Oxcarbazepine (TRILEPTAL) 300 MG tablet Take 300-600 mg by mouth See admin instructions. Take 300 mg by mouth in the morning and 600 mg at night    [provider]  oxybutynin (DITROPAN-XL) 10 MG 24 hr tablet Take 1 tablet (10 mg total) by mouth daily. 11/03/19   Jerilee Field, MD  pantoprazole (PROTONIX) 40 MG  tablet Take 1 tablet (40 mg total) by mouth 2 (two) times daily. Patient taking differently: Take 40 mg by mouth daily.  11/25/19   [name], MD  polyethylene glycol (MIRALAX / GLYCOLAX) 17 g packet Take 17 g by mouth 2 (two) times daily as needed for mild constipation. 11/14/19   Hughie Closs, MD  prochlorperazine (COMPAZINE) 10 MG tablet Take 1 tablet (10 mg total) by mouth every 6 (six) hours as needed for nausea or vomiting. 11/21/19   Zehr, Princella Pellegrini, PA-C  promethazine (PHENERGAN) 25 MG suppository Place 1 suppository (25 mg total) rectally every 6 (six) hours as needed for nausea or vomiting. 11/08/19   [name], MD  simvastatin (ZOCOR) 40 MG tablet Take 1 tablet (40 mg total) by mouth every evening. 01/09/20   [name], MD  sucralfate (CARAFATE) 1 GM/10ML suspension Take 10 mLs (1 g total) by mouth 4 (four) times daily -  with meals and at bedtime. 11/18/19   [name], MD  zaleplon (SONATA) 10 MG capsule Take 10 mg by mouth at bedtime as needed for sleep.    [provider]    Physical Exam: Vitals:   05/03/20 2330 05/04/20 0000 05/04/20 0030 05/04/20 0139  BP: (!) 161/88 (!) 161/92 127/75   Pulse: 81 81 77   Resp: 19 13 18    Temp:      TempSrc:      SpO2: 98% 99% 98%   Weight:    (!) 173.5 kg  Height:    5\' 4"  (1.626 m)    Constitutional: NAD, calm, comfortable. Sleeping, awakens to voice. Vitals:   05/03/20 2330 05/04/20 0000 05/04/20 0030 05/04/20 0139  BP: (!) 161/88 (!) 161/92 127/75   Pulse: 81 81 77   Resp: 19 13 18    Temp:      TempSrc:      SpO2: 98% 99% 98%   Weight:    (!) 173.5 kg  Height:    5\' 4"  (1.626 m)   General: WDWN, Alert and oriented x3.  Eyes: EOMI, PERRL, lids and conjunctivae normal.  Sclera nonicteric HENT:  [place]/AT, external ears normal.  Nares patent without epistasis.  Mucous membranes are moist. Posterior pharynx clear of any exudate or lesions. Normal dentition.  Neck: Soft, normal range of motion,  supple, no masses, Trachea midline Respiratory: Breath sounds are equal but diminished.  Diffuse expiratory wheezing.  Diffuse Rales.  No crackles. Normal respiratory effort. No accessory muscle use.  Cardiovascular: Regular rate and rhythm, no murmurs / rubs / gallops. Mild pedal edema. 2+ pedal pulses.  Abdomen: Soft, no tenderness, nondistended, morbidly obese.  No rebound or guarding.  No masses palpated. Bowel sounds normoactive Musculoskeletal: FROM. no clubbing / cyanosis. No joint deformity upper and lower extremities. no contractures. Normal muscle tone.  Skin: Warm, dry, intact no rashes, lesions, ulcers. No induration Neurologic: CN 2-12 grossly intact.  Normal speech.  Sensation intact, patella DTR +1 bilaterally. Strength 5/5 in all extremities.   Psychiatric: Normal judgment and insight.  Normal mood.    Labs on Admission: I have personally reviewed following labs and imaging studies  CBC: Recent Labs  Lab 05/03/20 1541  WBC 4.3  NEUTROABS 2.8  HGB 10.7*  HCT 34.1*  MCV 87.2  PLT 182    Basic Metabolic Panel: Recent Labs  Lab 05/03/20 1541  NA 134*  K 3.8  CL 95*  CO2 27  GLUCOSE 129*  BUN 13  CREATININE 0.73  CALCIUM 8.8*    GFR: Estimated Creatinine Clearance: 110.1 mL/min (by C-G formula based on SCr of 0.73 mg/dL).  Liver Function Tests: Recent Labs  Lab 05/03/20 1541  AST 58*  ALT 53*  ALKPHOS 95  BILITOT 0.7  PROT 7.0  ALBUMIN 4.1    Urine analysis:    Component Value Date/Time   COLORURINE YELLOW 11/21/2019 1054   APPEARANCEUR Cloudy (A) 11/21/2019 1054   APPEARANCEUR Clear 03/04/2019 0948   LABSPEC 1.010 11/21/2019 1054   LABSPEC 1.017 09/25/2012 2123   PHURINE 8.5 (A) 11/21/2019 1054   GLUCOSEU NEGATIVE 11/21/2019 1054   HGBUR NEGATIVE 11/21/2019 1054   HGBUR negative 05/17/2010 0946   BILIRUBINUR NEGATIVE 11/21/2019 1054   BILIRUBINUR Negative 03/04/2019 0948   BILIRUBINUR Negative 09/25/2012 2123   KETONESUR NEGATIVE  11/21/2019 1054   PROTEINUR 100 (A) 11/08/2019 0300   UROBILINOGEN 0.2 11/21/2019 1054   NITRITE NEGATIVE 11/21/2019 1054   LEUKOCYTESUR SMALL (A) 11/21/2019 1054   LEUKOCYTESUR Negative 09/25/2012 2123    Radiological Exams on Admission: DG Chest Port 1 View  Result Date: 05/03/2020 CLINICAL DATA:  Shortness of breath.  Hypoxia.  COVID-19 positive. EXAM: PORTABLE CHEST 1 VIEW COMPARISON:  Chest radiograph 10/31/2019 and CTA 11/09/2019 FINDINGS: The cardiac silhouette remains mildly enlarged. Aortic atherosclerosis is noted. There is persistent mild elevation of the right hemidiaphragm. Compared to the prior radiograph, there is increased pulmonary vascular congestion with mild interstitial densities bilaterally. No sizable pleural effusion or pneumothorax is identified. Prior cervical fusion is noted. IMPRESSION: Cardiomegaly with pulmonary vascular congestion and mild bilateral interstitial opacities which may reflect edema or atypical/viral infection. Electronically Signed   By: Sebastian Ache M.D.   On: 05/03/2020 15:42    EKG: Independently reviewed.  Normal sinus rhythm.  No acute ST elevation or depression.  Assessment/Plan Principal Problem:   Pneumonia due to COVID-19 virus Ms. Deshpande is admitted to the Covid unit at [facility].  She is started on remdesivir in the emergency room prior to transfer.  She was also started on Solu-Medrol.  Both these will be continued. Patient is placed on Lovenox for DVT prophylaxis Submental oxygen to keep O2 sat between 92 to 96%. Albuterol MDI as needed for shortness of breath, cough, wheeze  Active Problems:   Hypoxia Supplemental oxygen to keep O2 sat greater than 92-96%. Incentive spirometer every hour while awake.    DM type 2 with diabetic peripheral neuropathy (HCC) Blood sugar monitor before every meal nightly.  Check hemoglobin A1c.  Sign scale insulin as needed for glycemic control.    Essential hypertension Continue home medications  of Coreg and losartan.  Monitor blood pressure    CAD (coronary artery disease) Patient complained of intermittent chest pain for the last few days.  We will check serial troponin levels.  Aspirin 81 mg a day.    Hyperlipidemia associated with type 2 diabetes mellitus (HCC) Continue statin therapy with Zocor that she takes at home.    OSA We will monitor for nocturnal hypoxia.  CPAP will be used if absolutely needed since patient has COVID-19 and did not want to aerosolize any sputum.    Morbid obesity with BMI of 60.0-69.9, adult [facility]) Patient will need to follow with PCP for dietary lifestyle inventions for weight loss.  Patient may benefit from referral to bariatric surgery for evaluation     DVT prophylaxis:      [place] score elevated.  Lovenox for DVT prophylaxis Code Status:   Full code Family Communication:  Diagnosis plan discussed with patient.  Patient verbalized understanding agrees with plan.  Further recommendation follow as clinical indicated Disposition Plan:   Patient is from:  Home  Anticipated DC to:  Home  Anticipated DC date:  Anticipate greater than 2 midnight stay in the hospital  Anticipated DC barriers: No barriers to discharge identified at this time  Admission status:  Inpatient    Claudean Severance Bettymae Yott MD [facility]  How to contact the [facility] Attending or Consulting provider 7A - 7P or covering provider during after hours 7P -7A, for this patient?   1. Check the care team in [facility] and look for a) attending/consulting [facility] provider listed and b) the [facility] team listed 2. Log into [email or web address] and use [place]'s universal password to access. If you do not have the password, please contact the hospital operator. 3. Locate the [facility] provider you are looking for under [facility] and page to a number that you can be directly reached. 4. If you still have difficulty reaching the provider, please page the [facility] (Director on Call) for the Hospitalists listed  on amion for assistance.  05/04/2020, 3:25 AM

## 2020-05-04 NOTE — Telephone Encounter (Signed)
Pt returning call.  States she was admitted, then call dropped.

## 2020-05-04 NOTE — Telephone Encounter (Signed)
Lvm asking pt to call back.  Need an update.

## 2020-05-04 NOTE — Progress Notes (Signed)
PROGRESS NOTE    Amy Barnes  YQM:[id number] DOB: 1951-07-19 DOA: 05/03/2020 PCP: [name], MD     Brief Narrative:  69 y.o. WF PMHx  DM TypeII , OSA, HTN, allergic rhinitis, hypothyroidism, COPD, bipolar disorder, HLD,   Presented to [facility] on August 5 for evaluation of shortness of breath with subjective fever.  She reports that she was not feeling well for 2 to 3 days before she went to the emergency room and had progression of symptoms over the previous few days.  Stated she a home COVID-19 test that she got at the pharmacy which came back positive.  She reports that she was checking her pulse oximetry at home and it would drop to approximately 80% with her shortness of breath.  In the emergency room Covid swab was positive.  Patient required supplemental oxygen to maintain O2 sat greater than 92%.  She was started on remdesivir and Solu-Medrol was transferred to [facility] for further management.  She reports that she had subjective fever and chills but no nausea or vomiting.  She reports that family members in her house have also been sick with cough congestion and fever.  She has not had the COVID-19 vaccine.   Subjective: A/O x4, positive S OB, negative CP, positive nausea, positive vomiting, positive diarrhea.   Assessment & Plan: Covid vaccination; no vaccine   Principal Problem:   Pneumonia due to COVID-19 virus Active Problems:   DM type 2 with diabetic peripheral neuropathy (HCC)   Hyperlipidemia associated with type 2 diabetes mellitus (HCC)   Bipolar disorder (HCC)   Essential hypertension   OSA on CPAP   Morbid obesity with BMI of 60.0-69.9, adult (HCC)   CAD (coronary artery disease)   Hypoxia   Acute respiratory failure with hypoxia (HCC)   OSA (obstructive sleep apnea)   Obesity hypoventilation syndrome (HCC)   LVH (left ventricular hypertrophy)   HLD (hyperlipidemia)  Acute respiratory failure with hypoxia/Covid 19 pneumonia COVID-19 Labs  Recent  Labs    05/03/20 1541 05/04/20 0508  DDIMER 1.76*  --   FERRITIN 73 91  LDH 271* 284*  CRP 7.2* 9.3*    Lab Results  Component Value Date   SARSCOV2NAA POSITIVE (A) 05/03/2020   SARSCOV2NAA NEGATIVE 01/21/2020   SARSCOV2NAA NEGATIVE 12/23/2019   SARSCOV2NAA NEGATIVE 11/09/2019  -Solu-Medrol 86.8 mg BID -Remdesivir per pharmacy protocol -Respimat -Flutter valve -Incentive spirometry -Prone 8 hours/day; if patient cannot tolerate prone 2 to 3 hours per shift -Titrate O2 to maintain SPO2> 94% -500 mg daily -Zinc 220 mg daily  OSA/OHS -CPAP per respiratory protocol -Patient states has not had a CPAP machine in approximately 15 years secondary to insurance issues husband's insurance is in [place] and she now lives in [place]  Essential HTN -Coreg 6.25 mgBID -Losartan 50 mg daily -  Moderate LVH -Per echocardiogram 5/24 showed moderate LVH  CAD -ASA 81 mg daily -Patient states her cardiologist is Dr. Antoine Poche, and has had a couple of echocardiograms and EKGs in the past in order for [place] to check her abnormal EKG against.  Abnormal EKG -EKG with flipped T waves in leads I and aVL, investigate old vs new -Obtain troponin  Diarrhea -Lactoferrin pending -8/6 GI panel by PCR pending  DM type II uncontrolled with complication/DM peripheral neuropathy -Hemoglobin A1c pending -Moderate SSI  HLD -Lipid panel pending   Morbid obesity 60 to 69.9 kg/m -Once patient has been discharged consider referral for bariatric surgery evaluation   Bipolar DO/Anxiety -  Fluoxetine 40 mg daily -Lamictal  200 mg daily -Trileptal 300 mg daily  Morbid obesity with BMI of 60.0-69.9, adult (HCC) Patient will need to follow with PCP for dietary lifestyle inventions for weight loss.  Patient may benefit from referral to bariatric surgery for evaluation  Goals of care -8/6 patient clearly stated to me that in the event that her heart stopped or she stopped breathing she DID  NOT want to be placed on a ventilator or be resuscitated.  We discussed at length what this would mean.  And she stated she understood that she would be made DNR.   DVT prophylaxis: Lovenox Code Status: DNR Family Communication: 8/6 spoke with daughter [name] counseled on plan of care answered all questions Status is: Inpatient    Dispo: The patient is from: Home              Anticipated d/c is to: Home              Anticipated d/c date is: 8/12              Patient currently unstable      Consultants:    Procedures/Significant Events:    I have personally reviewed and interpreted all radiology studies and my findings are as above.  VENTILATOR SETTINGS: HFNC 8/6 Flow; 7 L/min SPO2; 93%   Cultures   Antimicrobials: 8/6 GI panel by PCR pending   Devices    LINES / TUBES:      Continuous Infusions: . sodium chloride 250 mL (05/03/20 1939)  . remdesivir 100 mg in NS 100 mL 100 mg (05/04/20 1100)     Objective: Vitals:   05/04/20 0400 05/04/20 0740 05/04/20 1151 05/04/20 1607  BP: 138/76 (!) 147/70 (!) 143/65 (!) 148/65  Pulse: 67 66 (!) 56 60  Resp: 18 19 16 16   Temp: 98 F (36.7 C) 97.9 F (36.6 C) 98 F (36.7 C) 98 F (36.7 C)  TempSrc: Axillary Oral Oral Oral  SpO2: 94% 94% 94% 95%  Weight:      Height:        Intake/Output Summary (Last 24 hours) at 05/04/2020 1614 Last data filed at 05/04/2020 1022 Gross per 24 hour  Intake 340 ml  Output --  Net 340 ml   Filed Weights   05/03/20 1516 05/04/20 0139  Weight: (!) 170.1 kg (!) 173.5 kg    Examination:  General: A/O x4, positive acute respiratory distress Eyes: negative scleral hemorrhage, negative anisocoria, negative icterus ENT: Negative Runny nose, negative gingival bleeding, Neck:  Negative scars, masses, torticollis, lymphadenopathy, JVD Lungs: tachypneic decreased breath sounds bilaterally without wheezes or crackles Cardiovascular: Regular rate and rhythm without murmur gallop  or rub normal S1 and S2 Abdomen: MORBIDLY OBESE abdominal pain, nondistended, positive soft, bowel sounds, no rebound, no ascites, no appreciable mass Extremities: No significant cyanosis, clubbing, or edema bilateral lower extremities Skin: Negative rashes, lesions, ulcers Psychiatric:  Negative depression, negative anxiety, negative fatigue, negative mania  Central nervous system:  Cranial nerves II through XII intact, tongue/uvula midline, all extremities muscle strength 5/5, sensation intact throughout,  negative dysarthria, negative expressive aphasia, negative receptive aphasia.  .     Data Reviewed: Care during the described time interval was provided by me .  I have reviewed this patient's available data, including medical history, events of note, physical examination, and all test results as part of my evaluation.  CBC: Recent Labs  Lab 05/03/20 1541 05/04/20 0508  WBC 4.3 6.5  NEUTROABS  2.8 4.7  HGB 10.7* 10.4*  HCT 34.1* 34.8*  MCV 87.2 91.1  PLT 182 163   Basic Metabolic Panel: Recent Labs  Lab 05/03/20 1541 05/04/20 0508  NA 134* 134*  K 3.8 5.0  CL 95* 99  CO2 27 21*  GLUCOSE 129* 151*  BUN 13 14  CREATININE 0.73 0.70  CALCIUM 8.8* 8.6*  MG  --  1.9  PHOS  --  4.2   GFR: Estimated Creatinine Clearance: 110.1 mL/min (by C-G formula based on SCr of 0.7 mg/dL). Liver Function Tests: Recent Labs  Lab 05/03/20 1541 05/04/20 0508  AST 58* 70*  ALT 53* 60*  ALKPHOS 95 80  BILITOT 0.7 0.6  PROT 7.0 6.4*  ALBUMIN 4.1 3.5   No results for input(s): LIPASE, AMYLASE in the last 168 hours. No results for input(s): AMMONIA in the last 168 hours. Coagulation Profile: No results for input(s): INR, PROTIME in the last 168 hours. Cardiac Enzymes: No results for input(s): CKTOTAL, CKMB, CKMBINDEX, TROPONINI in the last 168 hours. BNP (last 3 results) No results for input(s): PROBNP in the last 8760 hours. HbA1C: No results for input(s): HGBA1C in the last 72  hours. CBG: Recent Labs  Lab 05/04/20 0029 05/04/20 0356 05/04/20 0739 05/04/20 1149 05/04/20 1605  GLUCAP 184* 170* 165* 174* 140*   Lipid Profile: Recent Labs    05/03/20 1541  TRIG 106   Thyroid Function Tests: No results for input(s): TSH, T4TOTAL, FREET4, T3FREE, THYROIDAB in the last 72 hours. Anemia Panel: Recent Labs    05/03/20 1541 05/04/20 0508  FERRITIN 73 91   Sepsis Labs: Recent Labs  Lab 05/03/20 1541 05/03/20 1632 05/04/20 1132 05/04/20 1453  PROCALCITON <0.10  --   --   --   LATICACIDVEN  --  0.9 1.1 1.4    Recent Results (from the past 240 hour(s))  Blood Culture (routine x 2)     Status: None (Preliminary result)   Collection Time: 05/03/20  3:30 PM   Specimen: BLOOD  Result Value Ref Range Status   Specimen Description   Final    BLOOD BLOOD LEFT FOREARM Performed at [facility], [address] [place], [place] [address]    Special Requests   Final    BOTTLES DRAWN AEROBIC ONLY Blood Culture results may not be optimal due to an inadequate volume of blood received in culture bottles Performed at [facility], [address] [place], [place] [address]    Culture   Final    NO GROWTH < 24 HOURS Performed at [facility], [address] [place], [place] [address]    Report Status PENDING  Incomplete  Blood Culture (routine x 2)     Status: None (Preliminary result)   Collection Time: 05/03/20  3:41 PM   Specimen: BLOOD  Result Value Ref Range Status   Specimen Description   Final    BLOOD LEFT ANTECUBITAL Performed at [facility], [address] [place], [place] [address]    Special Requests   Final    BOTTLES DRAWN AEROBIC AND ANAEROBIC Blood Culture adequate volume Performed at [facility], [address] [place], [place] [address]    Culture   Final    NO GROWTH < 24 HOURS Performed at [facility], [address] [place], [place] [address]    Report Status  PENDING  Incomplete  SARS Coronavirus 2 by RT PCR (  hospital order, performed in [facility] hospital lab) Nasopharyngeal Nasopharyngeal Swab     Status: Abnormal   Collection Time: 05/03/20  4:32 PM   Specimen: Nasopharyngeal Swab  Result Value Ref Range Status   SARS Coronavirus 2 POSITIVE (A) NEGATIVE Final    Comment: RESULT CALLED TO, READ BACK BY AND VERIFIED WITH: JAMIE BAILEY RN @[id number]  05/03/2020 OLSONM (NOTE) SARS-CoV-2 target nucleic acids are DETECTED  SARS-CoV-2 RNA is generally detectable in upper respiratory specimens  during the acute phase of infection.  Positive results are indicative  of the presence of the identified virus, but do not rule out bacterial infection or co-infection with other pathogens not detected by the test.  Clinical correlation with patient history and  other diagnostic information is necessary to determine patient infection status.  The expected result is negative.  Fact Sheet for Patients:   BoilerBrush.com.cy   Fact Sheet for Healthcare Providers:   https://pope.com/    This test is not yet approved or cleared by the [place] FDA and  has been authorized for detection and/or diagnosis of SARS-CoV-2 by FDA under an Emergency Use Authorization (EUA).  This EUA will remain in effect (meaning th is test can be used) for the duration of  the COVID-19 declaration under Section 564(b)(1) of the Act, 21 U.S.C. section 360-bbb-3(b)(1), unless the authorization is terminated or revoked sooner.  Performed at [facility], [address], [place], [place] [address]          Radiology Studies: DG Chest [place] 1 View  Result Date: 05/03/2020 CLINICAL DATA:  Shortness of breath.  Hypoxia.  COVID-19 positive. EXAM: PORTABLE CHEST 1 VIEW COMPARISON:  Chest radiograph 10/31/2019 and CTA 11/09/2019 FINDINGS: The cardiac silhouette remains mildly enlarged. Aortic atherosclerosis is noted. There  is persistent mild elevation of the right hemidiaphragm. Compared to the prior radiograph, there is increased pulmonary vascular congestion with mild interstitial densities bilaterally. No sizable pleural effusion or pneumothorax is identified. Prior cervical fusion is noted. IMPRESSION: Cardiomegaly with pulmonary vascular congestion and mild bilateral interstitial opacities which may reflect edema or atypical/viral infection. Electronically Signed   By: Sebastian Ache M.D.   On: 05/03/2020 15:42        Scheduled Meds: . vitamin C  500 mg Oral Daily  . aspirin EC  81 mg Oral Daily  . carvedilol  6.25 mg Oral BID  . enoxaparin (LOVENOX) injection  80 mg Subcutaneous Q24H  . FLUoxetine  40 mg Oral Daily  . insulin aspart  0-15 Units Subcutaneous Q4H  . Ipratropium-Albuterol  1 puff Inhalation Q6H  . lamoTRIgine  200 mg Oral Daily  . levothyroxine  150 mcg Oral QHS  . losartan  50 mg Oral Daily  . methylPREDNISolone (SOLU-MEDROL) injection  0.5 mg/kg Intravenous Q12H  . mometasone-formoterol  2 puff Inhalation BID  . montelukast  10 mg Oral QHS  . [START ON 05/05/2020] OXcarbazepine  300 mg Oral Daily  . OXcarbazepine  600 mg Oral QHS  . pantoprazole  40 mg Oral Daily  . simvastatin  40 mg Oral QPM  . sodium chloride flush  3 mL Intravenous Q12H  . sodium chloride flush  3 mL Intravenous Q12H  . zinc sulfate  220 mg Oral Daily   Continuous Infusions: . sodium chloride 250 mL (05/03/20 1939)  . remdesivir 100 mg in NS 100 mL 100 mg (05/04/20 1100)     LOS: 0 days    Time spent:40 min    Jenaro Souder,  Roselind Messier, MD [facility] Pager [phone]  If 7PM-7AM, please contact night-coverage [email or web address] Password [email or web address] 05/04/2020, 4:14 PM

## 2020-05-05 LAB — MAGNESIUM: Magnesium: 2.1 mg/dL (ref 1.7–2.4)

## 2020-05-05 LAB — CBC WITH DIFFERENTIAL/PLATELET
Abs Immature Granulocytes: 0.01 10*3/uL (ref 0.00–0.07)
Basophils Absolute: 0 10*3/uL (ref 0.0–0.1)
Basophils Relative: 0 %
Eosinophils Absolute: 0 10*3/uL (ref 0.0–0.5)
Eosinophils Relative: 0 %
HCT: 33.8 % — ABNORMAL LOW (ref 36.0–46.0)
Hemoglobin: 10.4 g/dL — ABNORMAL LOW (ref 12.0–15.0)
Immature Granulocytes: 0 %
Lymphocytes Relative: 16 %
Lymphs Abs: 0.7 10*3/uL (ref 0.7–4.0)
MCH: 27.4 pg (ref 26.0–34.0)
MCHC: 30.8 g/dL (ref 30.0–36.0)
MCV: 88.9 fL (ref 80.0–100.0)
Monocytes Absolute: 0.4 10*3/uL (ref 0.1–1.0)
Monocytes Relative: 10 %
Neutro Abs: 3.1 10*3/uL (ref 1.7–7.7)
Neutrophils Relative %: 74 %
Platelets: 170 10*3/uL (ref 150–400)
RBC: 3.8 MIL/uL — ABNORMAL LOW (ref 3.87–5.11)
RDW: 16.2 % — ABNORMAL HIGH (ref 11.5–15.5)
WBC: 4.2 10*3/uL (ref 4.0–10.5)
nRBC: 0 % (ref 0.0–0.2)

## 2020-05-05 LAB — COMPREHENSIVE METABOLIC PANEL
ALT: 60 U/L — ABNORMAL HIGH (ref 0–44)
AST: 57 U/L — ABNORMAL HIGH (ref 15–41)
Albumin: 3.6 g/dL (ref 3.5–5.0)
Alkaline Phosphatase: 81 U/L (ref 38–126)
Anion gap: 11 (ref 5–15)
BUN: 26 mg/dL — ABNORMAL HIGH (ref 8–23)
CO2: 27 mmol/L (ref 22–32)
Calcium: 9 mg/dL (ref 8.9–10.3)
Chloride: 97 mmol/L — ABNORMAL LOW (ref 98–111)
Creatinine, Ser: 0.95 mg/dL (ref 0.44–1.00)
GFR calc Af Amer: >60 mL/min (ref >60)
GFR calc non Af Amer: >60 mL/min (ref >60)
Glucose, Bld: 167 mg/dL — ABNORMAL HIGH (ref 70–99)
Potassium: 4.5 mmol/L (ref 3.5–5.1)
Sodium: 135 mmol/L (ref 135–145)
Total Bilirubin: 0.6 mg/dL (ref 0.3–1.2)
Total Protein: 6.7 g/dL (ref 6.5–8.1)

## 2020-05-05 LAB — HEMOGLOBIN A1C
Hgb A1c MFr Bld: 7.3 % — ABNORMAL HIGH (ref 4.8–5.6)
Mean Plasma Glucose: 162.81 mg/dL

## 2020-05-05 LAB — LIPID PANEL
Cholesterol: 115 mg/dL (ref 0–200)
HDL: 44 mg/dL (ref >40)
LDL Cholesterol: 58 mg/dL (ref 0–99)
Total CHOL/HDL Ratio: 2.6 RATIO
Triglycerides: 63 mg/dL (ref <150)
VLDL: 13 mg/dL (ref 0–40)

## 2020-05-05 LAB — URINALYSIS, ROUTINE W REFLEX MICROSCOPIC
Bilirubin Urine: NEGATIVE
Glucose, UA: NEGATIVE mg/dL
Ketones, ur: NEGATIVE mg/dL
Nitrite: NEGATIVE
Protein, ur: 100 mg/dL — AB
Specific Gravity, Urine: 1.023 (ref 1.005–1.030)
WBC, UA: >50 WBC/hpf — ABNORMAL HIGH (ref 0–5)
pH: 7 (ref 5.0–8.0)

## 2020-05-05 LAB — LACTATE DEHYDROGENASE: LDH: 259 U/L — ABNORMAL HIGH (ref 98–192)

## 2020-05-05 LAB — D-DIMER, QUANTITATIVE: D-Dimer, Quant: 1.15 ug/mL-FEU — ABNORMAL HIGH (ref 0.00–0.50)

## 2020-05-05 LAB — GLUCOSE, CAPILLARY
Glucose-Capillary: 158 mg/dL — ABNORMAL HIGH (ref 70–99)
Glucose-Capillary: 159 mg/dL — ABNORMAL HIGH (ref 70–99)
Glucose-Capillary: 162 mg/dL — ABNORMAL HIGH (ref 70–99)
Glucose-Capillary: 166 mg/dL — ABNORMAL HIGH (ref 70–99)
Glucose-Capillary: 190 mg/dL — ABNORMAL HIGH (ref 70–99)
Glucose-Capillary: 212 mg/dL — ABNORMAL HIGH (ref 70–99)

## 2020-05-05 LAB — FERRITIN: Ferritin: 91 ng/mL (ref 11–307)

## 2020-05-05 LAB — PHOSPHORUS: Phosphorus: 4.2 mg/dL (ref 2.5–4.6)

## 2020-05-05 LAB — C-REACTIVE PROTEIN: CRP: 10.2 mg/dL — ABNORMAL HIGH (ref <1.0)

## 2020-05-05 MED ORDER — INSULIN ASPART 100 UNIT/ML ~~LOC~~ SOLN
0.0000 [IU] | Freq: Three times a day (TID) | SUBCUTANEOUS | Status: DC
  Administered 2020-05-06: 5 [IU] via SUBCUTANEOUS
  Administered 2020-05-06: 3 [IU] via SUBCUTANEOUS
  Administered 2020-05-06: 5 [IU] via SUBCUTANEOUS
  Administered 2020-05-07: 3 [IU] via SUBCUTANEOUS
  Administered 2020-05-07: 8 [IU] via SUBCUTANEOUS
  Administered 2020-05-07: 5 [IU] via SUBCUTANEOUS
  Administered 2020-05-08: 3 [IU] via SUBCUTANEOUS
  Administered 2020-05-08: 8 [IU] via SUBCUTANEOUS

## 2020-05-05 MED ORDER — INSULIN ASPART 100 UNIT/ML ~~LOC~~ SOLN
0.0000 [IU] | Freq: Every day | SUBCUTANEOUS | Status: DC
  Administered 2020-05-06: 3 [IU] via SUBCUTANEOUS
  Administered 2020-05-07: 2 [IU] via SUBCUTANEOUS

## 2020-05-05 MED ORDER — SODIUM CHLORIDE 0.9 % IV SOLN
1.0000 g | INTRAVENOUS | Status: DC
  Administered 2020-05-05 – 2020-05-07 (×3): 1 g via INTRAVENOUS
  Filled 2020-05-05 (×3): qty 10

## 2020-05-05 NOTE — Progress Notes (Addendum)
PROGRESS NOTE    Amy Barnes  ZDG:[id number] DOB: 12-Sep-1951 DOA: 05/03/2020 PCP: [name], MD     Brief Narrative:  69 y.o. WF PMHx  DM TypeII , OSA, HTN, allergic rhinitis, hypothyroidism, COPD, bipolar disorder, HLD,   Presented to [facility] on August 5 for evaluation of shortness of breath with subjective fever.  She reports that she was not feeling well for 2 to 3 days before she went to the emergency room and had progression of symptoms over the previous few days.  Stated she a home COVID-19 test that she got at the pharmacy which came back positive.  She reports that she was checking her pulse oximetry at home and it would drop to approximately 80% with her shortness of breath.  In the emergency room Covid swab was positive.  Patient required supplemental oxygen to maintain O2 sat greater than 92%.  She was started on remdesivir and Solu-Medrol was transferred to [facility] for further management.  She reports that she had subjective fever and chills but no nausea or vomiting.  She reports that family members in her house have also been sick with cough congestion and fever.  She has not had the COVID-19 vaccine.   Subjective: 8/7 afebrile overnight A/O x4, positive S OB but improved, negative CP, negative nausea, negative vomiting, negative diarrhea    Assessment & Plan: Covid vaccination; no vaccine   Principal Problem:   Pneumonia due to COVID-19 virus Active Problems:   DM type 2 with diabetic peripheral neuropathy (HCC)   Hyperlipidemia associated with type 2 diabetes mellitus (HCC)   Bipolar disorder (HCC)   Essential hypertension   OSA on CPAP   Morbid obesity with BMI of 60.0-69.9, adult (HCC)   CAD (coronary artery disease)   Hypoxia   Acute respiratory failure with hypoxia (HCC)   OSA (obstructive sleep apnea)   Obesity hypoventilation syndrome (HCC)   LVH (left ventricular hypertrophy)   HLD (hyperlipidemia)  Acute respiratory failure with hypoxia/Covid 19  pneumonia COVID-19 Labs  Recent Labs    05/03/20 1541 05/04/20 0508 05/05/20 0419  DDIMER 1.76*  --  1.15*  FERRITIN 73 91 91  LDH 271* 284* 259*  CRP 7.2* 9.3* 10.2*    Lab Results  Component Value Date   SARSCOV2NAA POSITIVE (A) 05/03/2020   SARSCOV2NAA NEGATIVE 01/21/2020   SARSCOV2NAA NEGATIVE 12/23/2019   SARSCOV2NAA NEGATIVE 11/09/2019  -Solu-Medrol 86.8 mg BID -Remdesivir per pharmacy protocol -Respimat -Flutter valve -Incentive spirometry -Prone 8 hours/day; if patient cannot tolerate prone 2 to 3 hours per shift -Titrate O2 to maintain SPO2> 94% -500 mg daily -Zinc 220 mg daily -We will counsel patient on the use of Actemra, as her CRP is climbing.  Believe will dose patient in the a.m. if she agrees  OSA/OHS -CPAP per respiratory protocol -Patient states has not had a CPAP machine in approximately 15 years secondary to insurance issues husband's insurance is in [place] and she now lives in [place]  UTI -8/7 patient's urinalysis consistent with UTI will start empiric antibiotic -Urine culture pending  Essential HTN -Coreg 6.25 mgBID -Losartan 50 mg daily -  Moderate LVH -Per echocardiogram 5/24 showed moderate LVH  CAD -ASA 81 mg daily -Patient states her cardiologist is Dr. Antoine Poche, and has had a couple of echocardiograms and EKGs in the past in order for [place] to check her abnormal EKG against.  Abnormal EKG -EKG with flipped T waves in leads I and aVL, investigate old vs new -Obtain troponin  Diarrhea -Lactoferrin pending -8/6 GI panel by PCR pending  DM type II uncontrolled with complication/DM peripheral neuropathy -Hemoglobin A1c pending -Moderate SSI  HLD -Lipid panel pending   Morbid obesity 60 to 69.9 kg/m -Once patient has been discharged consider referral for bariatric surgery evaluation   Bipolar DO/Anxiety -Fluoxetine 40 mg daily -Lamictal  200 mg daily -Trileptal 300 mg daily  Morbid obesity with BMI of  60.0-69.9, adult (HCC) Patient will need to follow with PCP for dietary lifestyle inventions for weight loss.  Patient may benefit from referral to bariatric surgery for evaluation  Goals of care -8/6 patient clearly stated to me that in the event that her heart stopped or she stopped breathing she DID NOT want to be placed on a ventilator or be resuscitated.  We discussed at length what this would mean.  And she stated she understood that she would be made DNR.   DVT prophylaxis: Lovenox Code Status: DNR Family Communication: 8/6 spoke with daughter [name] counseled on plan of care answered all questions Status is: Inpatient    Dispo: The patient is from: Home              Anticipated d/c is to: Home              Anticipated d/c date is: 8/12              Patient currently unstable      Consultants:    Procedures/Significant Events:    I have personally reviewed and interpreted all radiology studies and my findings are as above.  VENTILATOR SETTINGS: Nasal cannula 8/7 Flow; 2 L/min SPO2; 100%   Cultures 8/5 SARS coronavirus positive 8/5 blood left forearm NGTD 8/5 blood LEFT AC NGTD    Antimicrobials: 8/6 GI panel by PCR pending   Devices    LINES / TUBES:      Continuous Infusions: . sodium chloride 250 mL (05/03/20 1939)  . remdesivir 100 mg in NS 100 mL 100 mg (05/04/20 1100)     Objective: Vitals:   05/04/20 1151 05/04/20 1607 05/04/20 2025 05/05/20 0535  BP: (!) 143/65 (!) 148/65 128/63 113/61  Pulse: (!) 56 60 (!) 57 65  Resp: 16 16 16 11   Temp: 98 F (36.7 C) 98 F (36.7 C) 98.3 F (36.8 C) 98 F (36.7 C)  TempSrc: Oral Oral Oral Oral  SpO2: 94% 95% 95% 100%  Weight:      Height:        Intake/Output Summary (Last 24 hours) at 05/05/2020 1241 Last data filed at 05/05/2020 0400 Gross per 24 hour  Intake 0 ml  Output 500 ml  Net -500 ml   Filed Weights   05/03/20 1516 05/04/20 0139  Weight: (!) 170.1 kg (!) 173.5 kg     Examination:  General: A/O x4, positive acute respiratory distress Eyes: negative scleral hemorrhage, negative anisocoria, negative icterus ENT: Negative Runny nose, negative gingival bleeding, Neck:  Negative scars, masses, torticollis, lymphadenopathy, JVD Lungs: tachypneic decreased breath sounds bilaterally without wheezes or crackles Cardiovascular: Regular rate and rhythm without murmur gallop or rub normal S1 and S2 Abdomen: MORBIDLY OBESE abdominal pain, nondistended, positive soft, bowel sounds, no rebound, no ascites, no appreciable mass Extremities: No significant cyanosis, clubbing, or edema bilateral lower extremities Skin: Negative rashes, lesions, ulcers Psychiatric:  Negative depression, negative anxiety, negative fatigue, negative mania  Central nervous system:  Cranial nerves II through XII intact, tongue/uvula midline, all extremities muscle strength 5/5, sensation intact throughout,  negative dysarthria, negative expressive aphasia, negative receptive aphasia.  .     Data Reviewed: Care during the described time interval was provided by me .  I have reviewed this patient's available data, including medical history, events of note, physical examination, and all test results as part of my evaluation.  CBC: Recent Labs  Lab 05/03/20 1541 05/04/20 0508 05/05/20 0419  WBC 4.3 6.5 4.2  NEUTROABS 2.8 4.7 3.1  HGB 10.7* 10.4* 10.4*  HCT 34.1* 34.8* 33.8*  MCV 87.2 91.1 88.9  PLT 182 163 170   Basic Metabolic Panel: Recent Labs  Lab 05/03/20 1541 05/04/20 0508 05/05/20 0419  NA 134* 134* 135  K 3.8 5.0 4.5  CL 95* 99 97*  CO2 27 21* 27  GLUCOSE 129* 151* 167*  BUN 13 14 26*  CREATININE 0.73 0.70 0.95  CALCIUM 8.8* 8.6* 9.0  MG  --  1.9 2.1  PHOS  --  4.2 4.2   GFR: Estimated Creatinine Clearance: 92.7 mL/min (by C-G formula based on SCr of 0.95 mg/dL). Liver Function Tests: Recent Labs  Lab 05/03/20 1541 05/04/20 0508 05/05/20 0419  AST 58*  70* 57*  ALT 53* 60* 60*  ALKPHOS 95 80 81  BILITOT 0.7 0.6 0.6  PROT 7.0 6.4* 6.7  ALBUMIN 4.1 3.5 3.6   No results for input(s): LIPASE, AMYLASE in the last 168 hours. No results for input(s): AMMONIA in the last 168 hours. Coagulation Profile: No results for input(s): INR, PROTIME in the last 168 hours. Cardiac Enzymes: No results for input(s): CKTOTAL, CKMB, CKMBINDEX, TROPONINI in the last 168 hours. BNP (last 3 results) No results for input(s): PROBNP in the last 8760 hours. HbA1C: Recent Labs    05/04/20 1611 05/05/20 0419  HGBA1C 7.0* 7.3*   CBG: Recent Labs  Lab 05/04/20 2026 05/05/20 0005 05/05/20 0427 05/05/20 0816 05/05/20 1219  GLUCAP 182* 190* 162* 158* 212*   Lipid Profile: Recent Labs    05/03/20 1541 05/05/20 0419  CHOL  --  115  HDL  --  44  LDLCALC  --  58  TRIG 106 63  CHOLHDL  --  2.6   Thyroid Function Tests: No results for input(s): TSH, T4TOTAL, FREET4, T3FREE, THYROIDAB in the last 72 hours. Anemia Panel: Recent Labs    05/04/20 0508 05/05/20 0419  FERRITIN 91 91   Sepsis Labs: Recent Labs  Lab 05/03/20 1541 05/03/20 1632 05/04/20 1132 05/04/20 1453  PROCALCITON <0.10  --   --   --   LATICACIDVEN  --  0.9 1.1 1.4    Recent Results (from the past 240 hour(s))  Blood Culture (routine x 2)     Status: None (Preliminary result)   Collection Time: 05/03/20  3:30 PM   Specimen: BLOOD  Result Value Ref Range Status   Specimen Description   Final    BLOOD BLOOD LEFT FOREARM Performed at [facility], [address] [place], [place] [address]    Special Requests   Final    BOTTLES DRAWN AEROBIC ONLY Blood Culture results may not be optimal due to an inadequate volume of blood received in culture bottles Performed at [facility], [address] [place], [place] [address]    Culture   Final    NO GROWTH 2 DAYS Performed at [facility], [address] [place], [place] [address]    Report  Status PENDING  Incomplete  Blood Culture (routine x 2)  Status: None (Preliminary result)   Collection Time: 05/03/20  3:41 PM   Specimen: BLOOD  Result Value Ref Range Status   Specimen Description   Final    BLOOD LEFT ANTECUBITAL Performed at [facility], [address] [place], [place] [address]    Special Requests   Final    BOTTLES DRAWN AEROBIC AND ANAEROBIC Blood Culture adequate volume Performed at [facility], [address] [place], [place] [address]    Culture   Final    NO GROWTH 2 DAYS Performed at [facility], [address] [place], [place] [address]    Report Status PENDING  Incomplete  SARS Coronavirus 2 by RT PCR (hospital order, performed in [facility] hospital lab) Nasopharyngeal Nasopharyngeal Swab     Status: Abnormal   Collection Time: 05/03/20  4:32 PM   Specimen: Nasopharyngeal Swab  Result Value Ref Range Status   SARS Coronavirus 2 POSITIVE (A) NEGATIVE Final    Comment: RESULT CALLED TO, READ BACK BY AND VERIFIED WITH: JAMIE BAILEY RN @[id number]  05/03/2020 OLSONM (NOTE) SARS-CoV-2 target nucleic acids are DETECTED  SARS-CoV-2 RNA is generally detectable in upper respiratory specimens  during the acute phase of infection.  Positive results are indicative  of the presence of the identified virus, but do not rule out bacterial infection or co-infection with other pathogens not detected by the test.  Clinical correlation with patient history and  other diagnostic information is necessary to determine patient infection status.  The expected result is negative.  Fact Sheet for Patients:   BoilerBrush.com.cy   Fact Sheet for Healthcare Providers:   https://pope.com/    This test is not yet approved or cleared by the [place] FDA and  has been authorized for detection and/or diagnosis of SARS-CoV-2 by FDA under an Emergency Use Authorization (EUA).  This EUA  will remain in effect (meaning th is test can be used) for the duration of  the COVID-19 declaration under Section 564(b)(1) of the Act, 21 U.S.C. section 360-bbb-3(b)(1), unless the authorization is terminated or revoked sooner.  Performed at [facility], [address], [place], [place] [address]          Radiology Studies: DG Chest [place] 1 View  Result Date: 05/03/2020 CLINICAL DATA:  Shortness of breath.  Hypoxia.  COVID-19 positive. EXAM: PORTABLE CHEST 1 VIEW COMPARISON:  Chest radiograph 10/31/2019 and CTA 11/09/2019 FINDINGS: The cardiac silhouette remains mildly enlarged. Aortic atherosclerosis is noted. There is persistent mild elevation of the right hemidiaphragm. Compared to the prior radiograph, there is increased pulmonary vascular congestion with mild interstitial densities bilaterally. No sizable pleural effusion or pneumothorax is identified. Prior cervical fusion is noted. IMPRESSION: Cardiomegaly with pulmonary vascular congestion and mild bilateral interstitial opacities which may reflect edema or atypical/viral infection. Electronically Signed   By: Sebastian Ache M.D.   On: 05/03/2020 15:42        Scheduled Meds: . vitamin C  500 mg Oral Daily  . aspirin EC  81 mg Oral Daily  . carvedilol  6.25 mg Oral BID  . enoxaparin (LOVENOX) injection  80 mg Subcutaneous Q24H  . FLUoxetine  40 mg Oral Daily  . insulin aspart  0-15 Units Subcutaneous Q4H  . Ipratropium-Albuterol  1 puff Inhalation BID  . lamoTRIgine  200 mg Oral Daily  . levothyroxine  150 mcg Oral QHS  . losartan  50 mg Oral Daily  . methylPREDNISolone (SOLU-MEDROL) injection  0.5 mg/kg Intravenous Q12H  . mometasone-formoterol  2 puff Inhalation BID  . montelukast  10 mg Oral QHS  . OXcarbazepine  300 mg Oral Daily  . OXcarbazepine  600 mg Oral QHS  . pantoprazole  40 mg Oral Daily  . simvastatin  40 mg Oral QPM  . sodium chloride flush  3 mL Intravenous Q12H  . sodium chloride flush  3  mL Intravenous Q12H  . zinc sulfate  220 mg Oral Daily   Continuous Infusions: . sodium chloride 250 mL (05/03/20 1939)  . remdesivir 100 mg in NS 100 mL 100 mg (05/04/20 1100)     LOS: 1 day    Time spent:40 min    Anastasia Tompson, Roselind Messier, MD [facility] Pager [phone]  If 7PM-7AM, please contact night-coverage [email or web address] Password [facility] 05/05/2020, 12:41 PM

## 2020-05-06 LAB — COMPREHENSIVE METABOLIC PANEL
ALT: 53 U/L — ABNORMAL HIGH (ref 0–44)
AST: 42 U/L — ABNORMAL HIGH (ref 15–41)
Albumin: 3.3 g/dL — ABNORMAL LOW (ref 3.5–5.0)
Alkaline Phosphatase: 70 U/L (ref 38–126)
Anion gap: 10 (ref 5–15)
BUN: 24 mg/dL — ABNORMAL HIGH (ref 8–23)
CO2: 29 mmol/L (ref 22–32)
Calcium: 8.8 mg/dL — ABNORMAL LOW (ref 8.9–10.3)
Chloride: 98 mmol/L (ref 98–111)
Creatinine, Ser: 0.7 mg/dL (ref 0.44–1.00)
GFR calc Af Amer: >60 mL/min (ref >60)
GFR calc non Af Amer: >60 mL/min (ref >60)
Glucose, Bld: 210 mg/dL — ABNORMAL HIGH (ref 70–99)
Potassium: 4.7 mmol/L (ref 3.5–5.1)
Sodium: 137 mmol/L (ref 135–145)
Total Bilirubin: 0.5 mg/dL (ref 0.3–1.2)
Total Protein: 6.1 g/dL — ABNORMAL LOW (ref 6.5–8.1)

## 2020-05-06 LAB — GLUCOSE, CAPILLARY
Glucose-Capillary: 179 mg/dL — ABNORMAL HIGH (ref 70–99)
Glucose-Capillary: 251 mg/dL — ABNORMAL HIGH (ref 70–99)
Glucose-Capillary: 228 mg/dL — ABNORMAL HIGH (ref 70–99)
Glucose-Capillary: 262 mg/dL — ABNORMAL HIGH (ref 70–99)

## 2020-05-06 LAB — CBC WITH DIFFERENTIAL/PLATELET
Abs Immature Granulocytes: 0.02 10*3/uL (ref 0.00–0.07)
Basophils Absolute: 0 10*3/uL (ref 0.0–0.1)
Basophils Relative: 0 %
Eosinophils Absolute: 0 10*3/uL (ref 0.0–0.5)
Eosinophils Relative: 0 %
HCT: 32.5 % — ABNORMAL LOW (ref 36.0–46.0)
Hemoglobin: 10.4 g/dL — ABNORMAL LOW (ref 12.0–15.0)
Immature Granulocytes: 1 %
Lymphocytes Relative: 15 %
Lymphs Abs: 0.6 10*3/uL — ABNORMAL LOW (ref 0.7–4.0)
MCH: 28.3 pg (ref 26.0–34.0)
MCHC: 32 g/dL (ref 30.0–36.0)
MCV: 88.3 fL (ref 80.0–100.0)
Monocytes Absolute: 0.3 10*3/uL (ref 0.1–1.0)
Monocytes Relative: 7 %
Neutro Abs: 3.3 10*3/uL (ref 1.7–7.7)
Neutrophils Relative %: 77 %
Platelets: 200 10*3/uL (ref 150–400)
RBC: 3.68 MIL/uL — ABNORMAL LOW (ref 3.87–5.11)
RDW: 16.3 % — ABNORMAL HIGH (ref 11.5–15.5)
WBC: 4.3 10*3/uL (ref 4.0–10.5)
nRBC: 0 % (ref 0.0–0.2)

## 2020-05-06 LAB — C-REACTIVE PROTEIN: CRP: 5.6 mg/dL — ABNORMAL HIGH (ref <1.0)

## 2020-05-06 LAB — LACTATE DEHYDROGENASE: LDH: 223 U/L — ABNORMAL HIGH (ref 98–192)

## 2020-05-06 LAB — FERRITIN: Ferritin: 81 ng/mL (ref 11–307)

## 2020-05-06 LAB — PHOSPHORUS: Phosphorus: 3.1 mg/dL (ref 2.5–4.6)

## 2020-05-06 LAB — D-DIMER, QUANTITATIVE: D-Dimer, Quant: 0.72 ug/mL-FEU — ABNORMAL HIGH (ref 0.00–0.50)

## 2020-05-06 LAB — MAGNESIUM: Magnesium: 2 mg/dL (ref 1.7–2.4)

## 2020-05-06 MED ORDER — LOSARTAN POTASSIUM 50 MG PO TABS
75.0000 mg | ORAL_TABLET | Freq: Every day | ORAL | Status: DC
  Administered 2020-05-07 – 2020-05-08 (×2): 75 mg via ORAL
  Filled 2020-05-06 (×2): qty 2

## 2020-05-06 MED ORDER — ORAL CARE MOUTH RINSE
15.0000 mL | Freq: Two times a day (BID) | OROMUCOSAL | Status: DC
  Administered 2020-05-06: 15 mL via OROMUCOSAL

## 2020-05-06 NOTE — Progress Notes (Signed)
Pt has not been wearing CPAP while in the hospital.  Pt states she will continue to wear the [place]

## 2020-05-06 NOTE — Progress Notes (Signed)
PROGRESS NOTE    Amy Barnes  QMV:[id number] DOB: 09/03/1951 DOA: 05/03/2020 PCP: [name], MD     Brief Narrative:  69 y.o. WF PMHx  DM TypeII , OSA, HTN, allergic rhinitis, hypothyroidism, COPD, bipolar disorder, HLD,   Presented to [facility] on August 5 for evaluation of shortness of breath with subjective fever.  She reports that she was not feeling well for 2 to 3 days before she went to the emergency room and had progression of symptoms over the previous few days.  Stated she a home COVID-19 test that she got at the pharmacy which came back positive.  She reports that she was checking her pulse oximetry at home and it would drop to approximately 80% with her shortness of breath.  In the emergency room Covid swab was positive.  Patient required supplemental oxygen to maintain O2 sat greater than 92%.  She was started on remdesivir and Solu-Medrol was transferred to [facility] for further management.  She reports that she had subjective fever and chills but no nausea or vomiting.  She reports that family members in her house have also been sick with cough congestion and fever.  She has not had the COVID-19 vaccine.   Subjective: 8/8 afebrile overnight A/O x4, positive S OB but improved.  States the reason she did not take vaccine was because it had not been studied long enough and that there are robots in the vaccine that allows the government to track you.   Assessment & Plan: Covid vaccination; no vaccine   Principal Problem:   Pneumonia due to COVID-19 virus Active Problems:   DM type 2 with diabetic peripheral neuropathy (HCC)   Hyperlipidemia associated with type 2 diabetes mellitus (HCC)   Bipolar disorder (HCC)   Essential hypertension   OSA on CPAP   Morbid obesity with BMI of 60.0-69.9, adult (HCC)   CAD (coronary artery disease)   Hypoxia   Acute respiratory failure with hypoxia (HCC)   OSA (obstructive sleep apnea)   Obesity hypoventilation syndrome (HCC)   LVH  (left ventricular hypertrophy)   HLD (hyperlipidemia)  Acute respiratory failure with hypoxia/Covid 19 pneumonia COVID-19 Labs  Recent Labs    05/03/20 1541 05/03/20 1541 05/04/20 0508 05/05/20 0419 05/06/20 0416  DDIMER 1.76*  --   --  1.15* 0.72*  FERRITIN 73   < > 91 91 81  LDH 271*   < > 284* 259* 223*  CRP 7.2*   < > 9.3* 10.2* 5.6*   < > = values in this interval not displayed.    Lab Results  Component Value Date   SARSCOV2NAA POSITIVE (A) 05/03/2020   SARSCOV2NAA NEGATIVE 01/21/2020   SARSCOV2NAA NEGATIVE 12/23/2019   SARSCOV2NAA NEGATIVE 11/09/2019  -Solu-Medrol 86.8 mg BID -Remdesivir per pharmacy protocol -Respimat -Flutter valve -Incentive spirometry -Prone 8 hours/day; if patient cannot tolerate prone 2 to 3 hours per shift -Titrate O2 to maintain SPO2> 94% -500 mg daily -Zinc 220 mg daily -8/8 CRP trending down no need for Actemra at this time  OSA/OHS -CPAP per respiratory protocol -Patient states has not had a CPAP machine in approximately 15 years secondary to insurance issues husband's insurance is in [place] and she now lives in [place]  UTI positive Proteus mirabilis -8/7 patient's urinalysis consistent with UTI will start empiric antibiotic -Complete 5-day course of antibiotics  Essential HTN -Coreg 6.25 mg BID -8/8 increase Losartan 75 mg daily  -  Moderate LVH -Per echocardiogram 5/24 showed moderate LVH  CAD -ASA 81 mg daily -Patient states her cardiologist is Dr. Antoine Poche, and has had a couple of echocardiograms and EKGs in the past in order for [place] to check her abnormal EKG against.  Abnormal EKG -EKG with flipped T waves in leads I and aVL, investigate old vs new -Obtain troponin  Diarrhea -Lactoferrin pending -8/6 GI panel by PCR pending  DM type II uncontrolled with complication/DM peripheral neuropathy -Hemoglobin A1c pending -Moderate SSI  HLD -Lipid panel pending   Morbid obesity 60 to 69.9 kg/m -Once  patient has been discharged consider referral for bariatric surgery evaluation   Bipolar DO/Anxiety -Fluoxetine 40 mg daily -Lamictal  200 mg daily -Trileptal 300 mg daily  Morbid obesity with BMI of 60.0-69.9, adult (HCC) Patient will need to follow with PCP for dietary lifestyle inventions for weight loss.  Patient may benefit from referral to bariatric surgery for evaluation  Goals of care -8/6 patient clearly stated to me that in the event that her heart stopped or she stopped breathing she DID NOT want to be placed on a ventilator or be resuscitated.  We discussed at length what this would mean.  And she stated she understood that she would be made DNR.   DVT prophylaxis: Lovenox Code Status: DNR Family Communication: 8/6 spoke with daughter [name] counseled on plan of care answered all questions Status is: Inpatient    Dispo: The patient is from: Home              Anticipated d/c is to: Home              Anticipated d/c date is: 8/12              Patient currently unstable      Consultants:    Procedures/Significant Events:    I have personally reviewed and interpreted all radiology studies and my findings are as above.  VENTILATOR SETTINGS: Nasal cannula 8/8 Flow; 1 L/min SPO2; 93%   Cultures 8/5 SARS coronavirus positive 8/5 blood left forearm NGTD 8/5 blood LEFT AC NGTD 8/7 urine positive Proteus mirabilis   Antimicrobials: 8/6 GI panel by PCR pending   Devices    LINES / TUBES:      Continuous Infusions: . sodium chloride Stopped (05/05/20 2244)  . cefTRIAXone (ROCEPHIN)  IV Stopped (05/05/20 2134)  . remdesivir 100 mg in NS 100 mL Stopped (05/05/20 1900)     Objective: Vitals:   05/06/20 0330 05/06/20 0338 05/06/20 0556 05/06/20 0620  BP:   (!) 146/79   Pulse: 70 63 68 65  Resp: 18 11 17 16   Temp:   97.8 F (36.6 C)   TempSrc:   Oral   SpO2: (!) 80% 91% 95% 93%  Weight:      Height:        Intake/Output Summary (Last 24  hours) at 05/06/2020 0802 Last data filed at 05/06/2020 0600 Gross per 24 hour  Intake 1337.55 ml  Output 1350 ml  Net -12.45 ml   Filed Weights   05/03/20 1516 05/04/20 0139  Weight: (!) 170.1 kg (!) 173.5 kg    Examination:  General: A/O x4, positive acute respiratory distress Eyes: negative scleral hemorrhage, negative anisocoria, negative icterus ENT: Negative Runny nose, negative gingival bleeding, Neck:  Negative scars, masses, torticollis, lymphadenopathy, JVD Lungs: tachypneic decreased breath sounds bilaterally without wheezes or crackles Cardiovascular: Regular rate and rhythm without murmur gallop or rub normal S1 and S2 Abdomen: MORBIDLY OBESE abdominal pain, nondistended, positive soft, bowel sounds,  no rebound, no ascites, no appreciable mass Extremities: No significant cyanosis, clubbing, or edema bilateral lower extremities Skin: Negative rashes, lesions, ulcers Psychiatric:  Negative depression, negative anxiety, negative fatigue, negative mania  Central nervous system:  Cranial nerves II through XII intact, tongue/uvula midline, all extremities muscle strength 5/5, sensation intact throughout,  negative dysarthria, negative expressive aphasia, negative receptive aphasia.  .     Data Reviewed: Care during the described time interval was provided by me .  I have reviewed this patient's available data, including medical history, events of note, physical examination, and all test results as part of my evaluation.  CBC: Recent Labs  Lab 05/03/20 1541 05/04/20 0508 05/05/20 0419 05/06/20 0416  WBC 4.3 6.5 4.2 4.3  NEUTROABS 2.8 4.7 3.1 3.3  HGB 10.7* 10.4* 10.4* 10.4*  HCT 34.1* 34.8* 33.8* 32.5*  MCV 87.2 91.1 88.9 88.3  PLT 182 163 170 200   Basic Metabolic Panel: Recent Labs  Lab 05/03/20 1541 05/04/20 0508 05/05/20 0419 05/06/20 0416  NA 134* 134* 135 137  K 3.8 5.0 4.5 4.7  CL 95* 99 97* 98  CO2 27 21* 27 29  GLUCOSE 129* 151* 167* 210*  BUN 13  14 26* 24*  CREATININE 0.73 0.70 0.95 0.70  CALCIUM 8.8* 8.6* 9.0 8.8*  MG  --  1.9 2.1 2.0  PHOS  --  4.2 4.2 3.1   GFR: Estimated Creatinine Clearance: 110.1 mL/min (by C-G formula based on SCr of 0.7 mg/dL). Liver Function Tests: Recent Labs  Lab 05/03/20 1541 05/04/20 0508 05/05/20 0419 05/06/20 0416  AST 58* 70* 57* 42*  ALT 53* 60* 60* 53*  ALKPHOS 95 80 81 70  BILITOT 0.7 0.6 0.6 0.5  PROT 7.0 6.4* 6.7 6.1*  ALBUMIN 4.1 3.5 3.6 3.3*   No results for input(s): LIPASE, AMYLASE in the last 168 hours. No results for input(s): AMMONIA in the last 168 hours. Coagulation Profile: No results for input(s): INR, PROTIME in the last 168 hours. Cardiac Enzymes: No results for input(s): CKTOTAL, CKMB, CKMBINDEX, TROPONINI in the last 168 hours. BNP (last 3 results) No results for input(s): PROBNP in the last 8760 hours. HbA1C: Recent Labs    05/04/20 1611 05/05/20 0419  HGBA1C 7.0* 7.3*   CBG: Recent Labs  Lab 05/05/20 0816 05/05/20 1219 05/05/20 1755 05/05/20 2113 05/06/20 0749  GLUCAP 158* 212* 159* 166* 179*   Lipid Profile: Recent Labs    05/03/20 1541 05/05/20 0419  CHOL  --  115  HDL  --  44  LDLCALC  --  58  TRIG 106 63  CHOLHDL  --  2.6   Thyroid Function Tests: No results for input(s): TSH, T4TOTAL, FREET4, T3FREE, THYROIDAB in the last 72 hours. Anemia Panel: Recent Labs    05/05/20 0419 05/06/20 0416  FERRITIN 91 81   Sepsis Labs: Recent Labs  Lab 05/03/20 1541 05/03/20 1632 05/04/20 1132 05/04/20 1453  PROCALCITON <0.10  --   --   --   LATICACIDVEN  --  0.9 1.1 1.4    Recent Results (from the past 240 hour(s))  Blood Culture (routine x 2)     Status: None (Preliminary result)   Collection Time: 05/03/20  3:30 PM   Specimen: BLOOD  Result Value Ref Range Status   Specimen Description   Final    BLOOD BLOOD LEFT FOREARM Performed at [facility], [address] [place], [place] [address]    Special Requests   Final  BOTTLES DRAWN AEROBIC ONLY Blood Culture results may not be optimal due to an inadequate volume of blood received in culture bottles Performed at [facility], [address] [place], [place] [address]    Culture   Final    NO GROWTH 2 DAYS Performed at [facility], [address] [place], [place] [address]    Report Status PENDING  Incomplete  Blood Culture (routine x 2)     Status: None (Preliminary result)   Collection Time: 05/03/20  3:41 PM   Specimen: BLOOD  Result Value Ref Range Status   Specimen Description   Final    BLOOD LEFT ANTECUBITAL Performed at [facility], [address] [place], [place] [address]    Special Requests   Final    BOTTLES DRAWN AEROBIC AND ANAEROBIC Blood Culture adequate volume Performed at [facility], [address] [place], [place] [address]    Culture   Final    NO GROWTH 2 DAYS Performed at [facility], [address], [place], [place] [address]    Report Status PENDING  Incomplete  SARS Coronavirus 2 by RT PCR (hospital order, performed in [facility] hospital lab) Nasopharyngeal Nasopharyngeal Swab     Status: Abnormal   Collection Time: 05/03/20  4:32 PM   Specimen: Nasopharyngeal Swab  Result Value Ref Range Status   SARS Coronavirus 2 POSITIVE (A) NEGATIVE Final    Comment: RESULT CALLED TO, READ BACK BY AND VERIFIED WITH: JAMIE BAILEY RN @[phone]  05/03/2020 OLSONM (NOTE) SARS-CoV-2 target nucleic acids are DETECTED  SARS-CoV-2 RNA is generally detectable in upper respiratory specimens  during the acute phase of infection.  Positive results are indicative  of the presence of the identified virus, but do not rule out bacterial infection or co-infection with other pathogens not detected by the test.  Clinical correlation with patient history and  other diagnostic information is necessary to determine patient infection status.  The expected result is negative.  Fact Sheet for  Patients:   BoilerBrush.com.cy   Fact Sheet for Healthcare Providers:   https://pope.com/    This test is not yet approved or cleared by the [place] FDA and  has been authorized for detection and/or diagnosis of SARS-CoV-2 by FDA under an Emergency Use Authorization (EUA).  This EUA will remain in effect (meaning th is test can be used) for the duration of  the COVID-19 declaration under Section 564(b)(1) of the Act, 21 U.S.C. section 360-bbb-3(b)(1), unless the authorization is terminated or revoked sooner.  Performed at [facility], [address] [place], [place] [address]   Culture, Urine     Status: Abnormal (Preliminary result)   Collection Time: 05/05/20  2:47 PM   Specimen: Urine, Clean Catch  Result Value Ref Range Status   Specimen Description URINE, CLEAN CATCH  Final   Special Requests NONE  Final   Culture (A)  Final    >=100,000 COLONIES/mL GRAM NEGATIVE RODS SUSCEPTIBILITIES TO FOLLOW Performed at [facility], [address] [place], [place] [address]    Report Status PENDING  Incomplete         Radiology Studies: No results found.      Scheduled Meds: . vitamin C  500 mg Oral Daily  . aspirin EC  81 mg Oral Daily  . carvedilol  6.25 mg Oral BID  . enoxaparin (LOVENOX) injection  80 mg Subcutaneous Q24H  .  FLUoxetine  40 mg Oral Daily  . insulin aspart  0-15 Units Subcutaneous TID WC  . insulin aspart  0-5 Units Subcutaneous QHS  . Ipratropium-Albuterol  1 puff Inhalation BID  . lamoTRIgine  200 mg Oral Daily  . levothyroxine  150 mcg Oral QHS  . losartan  50 mg Oral Daily  . mouth rinse  15 mL Mouth Rinse BID  . methylPREDNISolone (SOLU-MEDROL) injection  0.5 mg/kg Intravenous Q12H  . mometasone-formoterol  2 puff Inhalation BID  . montelukast  10 mg Oral QHS  . OXcarbazepine  300 mg Oral Daily  . OXcarbazepine  600 mg Oral QHS  . pantoprazole  40 mg Oral Daily  .  simvastatin  40 mg Oral QPM  . sodium chloride flush  3 mL Intravenous Q12H  . sodium chloride flush  3 mL Intravenous Q12H  . zinc sulfate  220 mg Oral Daily   Continuous Infusions: . sodium chloride Stopped (05/05/20 2244)  . cefTRIAXone (ROCEPHIN)  IV Stopped (05/05/20 2134)  . remdesivir 100 mg in NS 100 mL Stopped (05/05/20 1900)     LOS: 2 days    Time spent:40 min    Mikaelah Trostle, Roselind Messier, MD [facility] Pager [phone]  If 7PM-7AM, please contact night-coverage [email or web address] Password [email or web address] 05/06/2020, 8:02 AM

## 2020-05-07 ENCOUNTER — Telehealth: Payer: Self-pay

## 2020-05-07 LAB — URINE CULTURE: Culture: >=100000 — AB

## 2020-05-07 LAB — CBC WITH DIFFERENTIAL/PLATELET
Abs Immature Granulocytes: 0.02 10*3/uL (ref 0.00–0.07)
Basophils Absolute: 0 10*3/uL (ref 0.0–0.1)
Basophils Relative: 0 %
Eosinophils Absolute: 0 10*3/uL (ref 0.0–0.5)
Eosinophils Relative: 0 %
HCT: 33.2 % — ABNORMAL LOW (ref 36.0–46.0)
Hemoglobin: 10.3 g/dL — ABNORMAL LOW (ref 12.0–15.0)
Immature Granulocytes: 0 %
Lymphocytes Relative: 16 %
Lymphs Abs: 0.7 10*3/uL (ref 0.7–4.0)
MCH: 27.1 pg (ref 26.0–34.0)
MCHC: 31 g/dL (ref 30.0–36.0)
MCV: 87.4 fL (ref 80.0–100.0)
Monocytes Absolute: 0.4 10*3/uL (ref 0.1–1.0)
Monocytes Relative: 8 %
Neutro Abs: 3.4 10*3/uL (ref 1.7–7.7)
Neutrophils Relative %: 76 %
Platelets: 174 10*3/uL (ref 150–400)
RBC: 3.8 MIL/uL — ABNORMAL LOW (ref 3.87–5.11)
RDW: 16.2 % — ABNORMAL HIGH (ref 11.5–15.5)
WBC: 4.5 10*3/uL (ref 4.0–10.5)
nRBC: 0 % (ref 0.0–0.2)

## 2020-05-07 LAB — COMPREHENSIVE METABOLIC PANEL
ALT: 50 U/L — ABNORMAL HIGH (ref 0–44)
AST: 35 U/L (ref 15–41)
Albumin: 3.3 g/dL — ABNORMAL LOW (ref 3.5–5.0)
Alkaline Phosphatase: 68 U/L (ref 38–126)
Anion gap: 13 (ref 5–15)
BUN: 20 mg/dL (ref 8–23)
CO2: 26 mmol/L (ref 22–32)
Calcium: 8.9 mg/dL (ref 8.9–10.3)
Chloride: 97 mmol/L — ABNORMAL LOW (ref 98–111)
Creatinine, Ser: 0.64 mg/dL (ref 0.44–1.00)
GFR calc Af Amer: >60 mL/min (ref >60)
GFR calc non Af Amer: >60 mL/min (ref >60)
Glucose, Bld: 226 mg/dL — ABNORMAL HIGH (ref 70–99)
Potassium: 4.4 mmol/L (ref 3.5–5.1)
Sodium: 136 mmol/L (ref 135–145)
Total Bilirubin: 0.4 mg/dL (ref 0.3–1.2)
Total Protein: 6.5 g/dL (ref 6.5–8.1)

## 2020-05-07 LAB — GLUCOSE, CAPILLARY
Glucose-Capillary: 188 mg/dL — ABNORMAL HIGH (ref 70–99)
Glucose-Capillary: 268 mg/dL — ABNORMAL HIGH (ref 70–99)
Glucose-Capillary: 247 mg/dL — ABNORMAL HIGH (ref 70–99)
Glucose-Capillary: 239 mg/dL — ABNORMAL HIGH (ref 70–99)

## 2020-05-07 LAB — LACTATE DEHYDROGENASE: LDH: 240 U/L — ABNORMAL HIGH (ref 98–192)

## 2020-05-07 LAB — MAGNESIUM: Magnesium: 2.2 mg/dL (ref 1.7–2.4)

## 2020-05-07 LAB — FERRITIN: Ferritin: 65 ng/mL (ref 11–307)

## 2020-05-07 LAB — C-REACTIVE PROTEIN: CRP: 2.7 mg/dL — ABNORMAL HIGH (ref <1.0)

## 2020-05-07 LAB — PHOSPHORUS: Phosphorus: 2.7 mg/dL (ref 2.5–4.6)

## 2020-05-07 LAB — D-DIMER, QUANTITATIVE: D-Dimer, Quant: 0.74 ug/mL-FEU — ABNORMAL HIGH (ref 0.00–0.50)

## 2020-05-07 MED ORDER — DOXYCYCLINE HYCLATE 100 MG PO TABS
100.0000 mg | ORAL_TABLET | Freq: Two times a day (BID) | ORAL | Status: DC
  Administered 2020-05-07 – 2020-05-08 (×2): 100 mg via ORAL
  Filled 2020-05-07 (×2): qty 1

## 2020-05-07 MED ORDER — OXYBUTYNIN CHLORIDE ER 10 MG PO TB24
10.0000 mg | ORAL_TABLET | Freq: Every day | ORAL | Status: DC
  Administered 2020-05-07 – 2020-05-08 (×2): 10 mg via ORAL
  Filled 2020-05-07 (×2): qty 1

## 2020-05-07 NOTE — Telephone Encounter (Signed)
Received call from patient's daughter requesting Dr. Drue Second call ED doctor Carolyne Littles) and explain to him that patient needs to continue doxycycline long term dose that patient has been taking. Daughter also requesting a call from Dr. Drue Second. Routing to MD to make aware. Valarie Cones

## 2020-05-07 NOTE — Progress Notes (Signed)
PROGRESS NOTE    Amy Barnes  ZOX:[id number] DOB: March 14, 1951 DOA: 05/03/2020 PCP: [name], MD     Brief Narrative:  69 y.o. WF PMHx  DM TypeII , OSA, HTN, allergic rhinitis, hypothyroidism, COPD, bipolar disorder, HLD,   Presented to [facility] on August 5 for evaluation of shortness of breath with subjective fever.  She reports that she was not feeling well for 2 to 3 days before she went to the emergency room and had progression of symptoms over the previous few days.  Stated she a home COVID-19 test that she got at the pharmacy which came back positive.  She reports that she was checking her pulse oximetry at home and it would drop to approximately 80% with her shortness of breath.  In the emergency room Covid swab was positive.  Patient required supplemental oxygen to maintain O2 sat greater than 92%.  She was started on remdesivir and Solu-Medrol was transferred to [facility] for further management.  She reports that she had subjective fever and chills but no nausea or vomiting.  She reports that family members in her house have also been sick with cough congestion and fever.  She has not had the COVID-19 vaccine.   Subjective: 8/9 afebrile overnight A/O x4, positive S OB but improved from 8/8.  Negative abdominal pain, negative nausea, negative vomiting   Assessment & Plan: Covid vaccination; no vaccine   Principal Problem:   Pneumonia due to COVID-19 virus Active Problems:   DM type 2 with diabetic peripheral neuropathy (HCC)   Hyperlipidemia associated with type 2 diabetes mellitus (HCC)   Bipolar disorder (HCC)   Essential hypertension   OSA on CPAP   Morbid obesity with BMI of 60.0-69.9, adult (HCC)   CAD (coronary artery disease)   Hypoxia   Acute respiratory failure with hypoxia (HCC)   OSA (obstructive sleep apnea)   Obesity hypoventilation syndrome (HCC)   LVH (left ventricular hypertrophy)   HLD (hyperlipidemia)  Acute respiratory failure with hypoxia/Covid  19 pneumonia COVID-19 Labs  Recent Labs    05/05/20 0419 05/06/20 0416 05/07/20 0500  DDIMER 1.15* 0.72* 0.74*  FERRITIN 91 81 65  LDH 259* 223* 240*  CRP 10.2* 5.6* 2.7*    Lab Results  Component Value Date   SARSCOV2NAA POSITIVE (A) 05/03/2020   SARSCOV2NAA NEGATIVE 01/21/2020   SARSCOV2NAA NEGATIVE 12/23/2019   SARSCOV2NAA NEGATIVE 11/09/2019  -Solu-Medrol 86.8 mg BID -Remdesivir per pharmacy protocol -Respimat -Flutter valve -Incentive spirometry -Prone 8 hours/day; if patient cannot tolerate prone 2 to 3 hours per shift -Titrate O2 to maintain SPO2> 94% -500 mg daily -Zinc 220 mg daily -8/8 CRP trending down no need for Actemra at this time -ambulatory SPO2 pending  OSA/OHS -CPAP per respiratory protocol -Patient states has not had a CPAP machine in approximately 15 years secondary to insurance issues husband's insurance is in [place] and she now lives in [place]  UTI positive Proteus mirabilis -8/7 patient's urinalysis consistent with UTI will start empiric antibiotic -Complete 5-day course of antibiotics  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 Judyann Munson, ID who verified that should remain on doxycycline secondary to chronic MRSA in her hardware in her back. -Doxycycline 100 mg BID  Essential HTN -Coreg 6.25 mg BID -8/8 increase Losartan 75 mg daily  -  Moderate LVH -Per echocardiogram 5/24 showed moderate LVH  CAD -ASA 81 mg daily -Patient states her cardiologist is Dr. Antoine Poche, and has had a couple of echocardiograms and EKGs in  the past in order for [place] to check her abnormal EKG against.  Abnormal EKG -EKG with flipped T waves in leads I and aVL, investigate old vs new -Obtain troponin  Diarrhea -Lactoferrin pending -8/6 GI panel by PCR pending  DM type II uncontrolled with complication/DM peripheral neuropathy -8/7 Hemoglobin A1c= 7.3 -Moderate SSI  HLD -8/7 LDL = 58   Bipolar DO/Anxiety -Fluoxetine 40 mg  daily -Lamictal  200 mg daily -Trileptal 300 mg daily  Morbid obesity with BMI of 60.0-69.9, adult (HCC) Patient will need to follow with PCP for dietary lifestyle inventions for weight loss.  Patient may benefit from referral to bariatric surgery for evaluation  Goals of care -8/6 patient clearly stated to me that in the event that her heart stopped or she stopped breathing she DID NOT want to be placed on a ventilator or be resuscitated.  We discussed at length what this would mean.  And she stated she understood that she would be made DNR.   DVT prophylaxis: Lovenox Code Status: DNR Family Communication: 8/9 spoke with daughter [name] counseled on plan of care answered all questions Status is: Inpatient    Dispo: The patient is from: Home              Anticipated d/c is to: Home              Anticipated d/c date is: 8/9              Patient currently unstable      Consultants:    Procedures/Significant Events:    I have personally reviewed and interpreted all radiology studies and my findings are as above.  VENTILATOR SETTINGS: Nasal cannula 9/8 Room air SPO2; 90%   Cultures 8/5 SARS coronavirus positive 8/5 blood left forearm NGTD 8/5 blood LEFT AC NGTD 8/7 urine positive Proteus mirabilis 8/6 GI panel by PCR pending  Antimicrobials: Anti-infectives (From admission, onward)   Start     Ordered Stop   05/07/20 2200  doxycycline (VIBRA-TABS) tablet 100 mg     Discontinue     05/07/20 1629     05/05/20 1730  cefTRIAXone (ROCEPHIN) 1 g in sodium chloride 0.9 % 100 mL IVPB     Discontinue     05/05/20 1719     05/05/20 1000  remdesivir 100 mg in sodium chloride 0.9 % 100 mL IVPB  Status:  Discontinued       "Followed by" Linked Group Details   05/04/20 0419 05/04/20 0422   05/04/20 1000  remdesivir 100 mg in sodium chloride 0.9 % 100 mL IVPB  Status:  Discontinued       "Followed by" Linked Group Details   05/03/20 1835 05/03/20 1902   05/04/20 1000   remdesivir 100 mg in sodium chloride 0.9 % 100 mL IVPB        05/03/20 1902 05/07/20 0940   05/04/20 0419  remdesivir 200 mg in sodium chloride 0.9% 250 mL IVPB  Status:  Discontinued       "Followed by" Linked Group Details   05/04/20 0419 05/04/20 0422   05/03/20 2000  remdesivir 100 mg in sodium chloride 0.9 % 100 mL IVPB  Status:  Discontinued       "And" Linked Group Details   05/03/20 1902 05/04/20 0753   05/03/20 1915  remdesivir 100 mg in sodium chloride 0.9 % 100 mL IVPB  Status:  Discontinued       "And" Linked Group Details   05/03/20 1902  05/04/20 0753   05/03/20 1845  remdesivir 200 mg in sodium chloride 0.9% 250 mL IVPB  Status:  Discontinued       "Followed by" Linked Group Details   05/03/20 1835 05/03/20 1902       Devices    LINES / TUBES:      Continuous Infusions: . sodium chloride Stopped (05/07/20 0623)  . cefTRIAXone (ROCEPHIN)  IV Stopped (05/06/20 2147)  . remdesivir 100 mg in NS 100 mL Stopped (05/06/20 1900)     Objective: Vitals:   05/06/20 2322 05/07/20 0200 05/07/20 0353 05/07/20 0601  BP:    (!) 165/82  Pulse: (!) 54 61 (!) 59 67  Resp: 14 16 15 14   Temp:    97.7 F (36.5 C)  TempSrc:    Oral  SpO2: 96% 95% 97% 92%  Weight:      Height:        Intake/Output Summary (Last 24 hours) at 05/07/2020 0102 Last data filed at 05/07/2020 7253 Gross per 24 hour  Intake 912.48 ml  Output 700 ml  Net 212.48 ml   Filed Weights   05/03/20 1516 05/04/20 0139  Weight: (!) 170.1 kg (!) 173.5 kg    Examination:  General: A/O x4, positive acute respiratory distress Eyes: negative scleral hemorrhage, negative anisocoria, negative icterus ENT: Negative Runny nose, negative gingival bleeding, Neck:  Negative scars, masses, torticollis, lymphadenopathy, JVD Lungs: tachypneic decreased breath sounds bilaterally without wheezes or crackles Cardiovascular: Regular rate and rhythm without murmur gallop or rub normal S1 and S2 Abdomen: MORBIDLY OBESE  abdominal pain, nondistended, positive soft, bowel sounds, no rebound, no ascites, no appreciable mass Extremities: No significant cyanosis, clubbing, or edema bilateral lower extremities Skin: Negative rashes, lesions, ulcers Psychiatric:  Negative depression, negative anxiety, negative fatigue, negative mania  Central nervous system:  Cranial nerves II through XII intact, tongue/uvula midline, all extremities muscle strength 5/5, sensation intact throughout,  negative dysarthria, negative expressive aphasia, negative receptive aphasia.  .     Data Reviewed: Care during the described time interval was provided by me .  I have reviewed this patient's available data, including medical history, events of note, physical examination, and all test results as part of my evaluation.  CBC: Recent Labs  Lab 05/03/20 1541 05/04/20 0508 05/05/20 0419 05/06/20 0416 05/07/20 0500  WBC 4.3 6.5 4.2 4.3 4.5  NEUTROABS 2.8 4.7 3.1 3.3 3.4  HGB 10.7* 10.4* 10.4* 10.4* 10.3*  HCT 34.1* 34.8* 33.8* 32.5* 33.2*  MCV 87.2 91.1 88.9 88.3 87.4  PLT 182 163 170 200 174   Basic Metabolic Panel: Recent Labs  Lab 05/03/20 1541 05/04/20 0508 05/05/20 0419 05/06/20 0416 05/07/20 0500  NA 134* 134* 135 137 136  K 3.8 5.0 4.5 4.7 4.4  CL 95* 99 97* 98 97*  CO2 27 21* 27 29 26   GLUCOSE 129* 151* 167* 210* 226*  BUN 13 14 26* 24* 20  CREATININE 0.73 0.70 0.95 0.70 0.64  CALCIUM 8.8* 8.6* 9.0 8.8* 8.9  MG  --  1.9 2.1 2.0 2.2  PHOS  --  4.2 4.2 3.1 2.7   GFR: Estimated Creatinine Clearance: 110.1 mL/min (by C-G formula based on SCr of 0.64 mg/dL). Liver Function Tests: Recent Labs  Lab 05/03/20 1541 05/04/20 0508 05/05/20 0419 05/06/20 0416 05/07/20 0500  AST 58* 70* 57* 42* 35  ALT 53* 60* 60* 53* 50*  ALKPHOS 95 80 81 70 68  BILITOT 0.7 0.6 0.6 0.5 0.4  PROT 7.0 6.4* 6.7  6.1* 6.5  ALBUMIN 4.1 3.5 3.6 3.3* 3.3*   No results for input(s): LIPASE, AMYLASE in the last 168 hours. No results  for input(s): AMMONIA in the last 168 hours. Coagulation Profile: No results for input(s): INR, PROTIME in the last 168 hours. Cardiac Enzymes: No results for input(s): CKTOTAL, CKMB, CKMBINDEX, TROPONINI in the last 168 hours. BNP (last 3 results) No results for input(s): PROBNP in the last 8760 hours. HbA1C: Recent Labs    05/04/20 1611 05/05/20 0419  HGBA1C 7.0* 7.3*   CBG: Recent Labs  Lab 05/06/20 0749 05/06/20 1223 05/06/20 1656 05/06/20 2125 05/07/20 0738  GLUCAP 179* 251* 228* 262* 188*   Lipid Profile: Recent Labs    05/05/20 0419  CHOL 115  HDL 44  LDLCALC 58  TRIG 63  CHOLHDL 2.6   Thyroid Function Tests: No results for input(s): TSH, T4TOTAL, FREET4, T3FREE, THYROIDAB in the last 72 hours. Anemia Panel: Recent Labs    05/06/20 0416 05/07/20 0500  FERRITIN 81 65   Sepsis Labs: Recent Labs  Lab 05/03/20 1541 05/03/20 1632 05/04/20 1132 05/04/20 1453  PROCALCITON <0.10  --   --   --   LATICACIDVEN  --  0.9 1.1 1.4    Recent Results (from the past 240 hour(s))  Blood Culture (routine x 2)     Status: None (Preliminary result)   Collection Time: 05/03/20  3:30 PM   Specimen: BLOOD  Result Value Ref Range Status   Specimen Description   Final    BLOOD BLOOD LEFT FOREARM Performed at [facility], [address] [place], [place] [address]    Special Requests   Final    BOTTLES DRAWN AEROBIC ONLY Blood Culture results may not be optimal due to an inadequate volume of blood received in culture bottles Performed at [facility], [address] [place], [place] [address]    Culture   Final    NO GROWTH 3 DAYS Performed at [facility], [address] [place], [place] [address]    Report Status PENDING  Incomplete  Blood Culture (routine x 2)     Status: None (Preliminary result)   Collection Time: 05/03/20  3:41 PM   Specimen: BLOOD  Result Value Ref Range Status   Specimen Description   Final    BLOOD LEFT  ANTECUBITAL Performed at [facility], [address] [place], [place] [address]    Special Requests   Final    BOTTLES DRAWN AEROBIC AND ANAEROBIC Blood Culture adequate volume Performed at [facility], [address] [place], [place] [address]    Culture   Final    NO GROWTH 3 DAYS Performed at [facility], [address] [place], [place] [address]    Report Status PENDING  Incomplete  SARS Coronavirus 2 by RT PCR (hospital order, performed in [facility] hospital lab) Nasopharyngeal Nasopharyngeal Swab     Status: Abnormal   Collection Time: 05/03/20  4:32 PM   Specimen: Nasopharyngeal Swab  Result Value Ref Range Status   SARS Coronavirus 2 POSITIVE (A) NEGATIVE Final    Comment: RESULT CALLED TO, READ BACK BY AND VERIFIED WITH: JAMIE BAILEY RN @1746  05/03/2020 OLSONM (NOTE) SARS-CoV-2 target nucleic acids are DETECTED  SARS-CoV-2 RNA is generally detectable in upper respiratory specimens  during the acute phase of infection.  Positive results are indicative  of the presence of the identified virus, but do  not rule out bacterial infection or co-infection with other pathogens not detected by the test.  Clinical correlation with patient history and  other diagnostic information is necessary to determine patient infection status.  The expected result is negative.  Fact Sheet for Patients:   BoilerBrush.com.cy   Fact Sheet for Healthcare Providers:   https://pope.com/    This test is not yet approved or cleared by the [place] FDA and  has been authorized for detection and/or diagnosis of SARS-CoV-2 by FDA under an Emergency Use Authorization (EUA).  This EUA will remain in effect (meaning th is test can be used) for the duration of  the COVID-19 declaration under Section 564(b)(1) of the Act, 21 U.S.C. section 360-bbb-3(b)(1), unless the authorization is terminated or revoked  sooner.  Performed at [facility], [address] [place], [place] [address]   Culture, Urine     Status: Abnormal   Collection Time: 05/05/20  2:47 PM   Specimen: Urine, Clean Catch  Result Value Ref Range Status   Specimen Description URINE, CLEAN CATCH  Final   Special Requests   Final    NONE Performed at [facility], [address], [place], [place] [address]    Culture >=100,000 COLONIES/mL PROTEUS MIRABILIS (A)  Final   Report Status 05/07/2020 FINAL  Final   Organism ID, Bacteria PROTEUS MIRABILIS (A)  Final      Susceptibility   Proteus mirabilis - MIC*    AMPICILLIN <=2 SENSITIVE Sensitive     CEFAZOLIN <=4 SENSITIVE Sensitive     CEFTRIAXONE <=0.25 SENSITIVE Sensitive     CIPROFLOXACIN <=0.25 SENSITIVE Sensitive     GENTAMICIN <=1 SENSITIVE Sensitive     IMIPENEM 4 SENSITIVE Sensitive     NITROFURANTOIN 128 RESISTANT Resistant     TRIMETH/SULFA <=20 SENSITIVE Sensitive     AMPICILLIN/SULBACTAM <=2 SENSITIVE Sensitive     * >=100,000 COLONIES/mL PROTEUS MIRABILIS         Radiology Studies: No results found.      Scheduled Meds: . vitamin C  500 mg Oral Daily  . aspirin EC  81 mg Oral Daily  . carvedilol  6.25 mg Oral BID  . enoxaparin (LOVENOX) injection  80 mg Subcutaneous Q24H  . FLUoxetine  40 mg Oral Daily  . insulin aspart  0-15 Units Subcutaneous TID WC  . insulin aspart  0-5 Units Subcutaneous QHS  . Ipratropium-Albuterol  1 puff Inhalation BID  . lamoTRIgine  200 mg Oral Daily  . levothyroxine  150 mcg Oral QHS  . losartan  75 mg Oral Daily  . mouth rinse  15 mL Mouth Rinse BID  . methylPREDNISolone (SOLU-MEDROL) injection  0.5 mg/kg Intravenous Q12H  . mometasone-formoterol  2 puff Inhalation BID  . montelukast  10 mg Oral QHS  . OXcarbazepine  300 mg Oral Daily  . OXcarbazepine  600 mg Oral QHS  . pantoprazole  40 mg Oral Daily  . simvastatin  40 mg Oral QPM  . sodium chloride flush  3 mL Intravenous Q12H  . sodium  chloride flush  3 mL Intravenous Q12H  . zinc sulfate  220 mg Oral Daily   Continuous Infusions: . sodium chloride Stopped (05/07/20 0623)  . cefTRIAXone (ROCEPHIN)  IV Stopped (05/06/20 2147)  . remdesivir 100 mg in NS 100 mL Stopped (05/06/20 1900)     LOS: 3 days    Time spent:40 min    Miray Mancino, Roselind Messier, MD [facility] Pager [phone]  If 7PM-7AM, please contact night-coverage [email or web address] Password [facility] 05/07/2020, 8:32 AM

## 2020-05-07 NOTE — Progress Notes (Signed)
Inpatient Diabetes Program Recommendations  AACE/ADA: New Consensus Statement on Inpatient Glycemic Control (2015)  Target Ranges:  Prepandial:   less than 140 mg/dL      Peak postprandial:   less than 180 mg/dL (1-2 hours)      Critically ill patients:  140 - 180 mg/dL   Lab Results  Component Value Date   GLUCAP 268 (H) 05/07/2020   HGBA1C 7.3 (H) 05/05/2020    Review of Glycemic Control Results for Amy Barnes, Amy ADAMEC "[name]" (MRN [id number]) as of 05/07/2020 12:49  Ref. Range 05/06/2020 07:49 05/06/2020 12:23 05/06/2020 16:56 05/06/2020 21:25 05/07/2020 07:38 05/07/2020 11:56  Glucose-Capillary Latest Ref Range: 70 - 99 mg/dL 622 (H) 633 (H) 354 (H) 262 (H) 188 (H) 268 (H)   Diabetes history: DM2 Outpatient Diabetes medications: Metformin 500 mg qd Current orders for Inpatient glycemic control: Novolog moderate correction tid + hs 0-5 units  Inpatient Diabetes Program Recommendations:   While on steroids: -Novolog 4 units tid meal coverage if eats 50% Secure chat to Dr. Valentina Lucks with recommendations.  Thank you, Billy Fischer. Reg Bircher, RN, MSN, CDE  Diabetes Coordinator Inpatient Glycemic Control Team Team Pager [phone] (8am-5pm) 05/07/2020 12:50 PM

## 2020-05-07 NOTE — Progress Notes (Signed)
SATURATION QUALIFICATIONS: (This note is used to comply with regulatory documentation for home oxygen)  Patient Saturations on Room Air at Rest = 100%  Patient Saturations on Room Air while Ambulating = 94%  Patient Saturations on [place] of oxygen while Ambulating = 100%  Please briefly explain why patient needs home oxygen: Pt has been on room air >24hrs with O2 sat 96-100% RA

## 2020-05-08 DIAGNOSIS — F3110 Bipolar disorder, current episode manic without psychotic features, unspecified: Secondary | ICD-10-CM

## 2020-05-08 DIAGNOSIS — E7800 Pure hypercholesterolemia, unspecified: Secondary | ICD-10-CM

## 2020-05-08 LAB — CBC WITH DIFFERENTIAL/PLATELET
Abs Immature Granulocytes: 0.06 10*3/uL (ref 0.00–0.07)
Basophils Absolute: 0 10*3/uL (ref 0.0–0.1)
Basophils Relative: 0 %
Eosinophils Absolute: 0 10*3/uL (ref 0.0–0.5)
Eosinophils Relative: 0 %
HCT: 35.8 % — ABNORMAL LOW (ref 36.0–46.0)
Hemoglobin: 11.2 g/dL — ABNORMAL LOW (ref 12.0–15.0)
Immature Granulocytes: 1 %
Lymphocytes Relative: 10 %
Lymphs Abs: 0.6 10*3/uL — ABNORMAL LOW (ref 0.7–4.0)
MCH: 26.9 pg (ref 26.0–34.0)
MCHC: 31.3 g/dL (ref 30.0–36.0)
MCV: 85.9 fL (ref 80.0–100.0)
Monocytes Absolute: 0.3 10*3/uL (ref 0.1–1.0)
Monocytes Relative: 6 %
Neutro Abs: 5 10*3/uL (ref 1.7–7.7)
Neutrophils Relative %: 83 %
Platelets: 192 10*3/uL (ref 150–400)
RBC: 4.17 MIL/uL (ref 3.87–5.11)
RDW: 16.2 % — ABNORMAL HIGH (ref 11.5–15.5)
WBC: 6 10*3/uL (ref 4.0–10.5)
nRBC: 0 % (ref 0.0–0.2)

## 2020-05-08 LAB — CULTURE, BLOOD (ROUTINE X 2)
Culture: NO GROWTH
Culture: NO GROWTH
Special Requests: ADEQUATE

## 2020-05-08 LAB — COMPREHENSIVE METABOLIC PANEL
ALT: 51 U/L — ABNORMAL HIGH (ref 0–44)
AST: 29 U/L (ref 15–41)
Albumin: 3.7 g/dL (ref 3.5–5.0)
Alkaline Phosphatase: 67 U/L (ref 38–126)
Anion gap: 13 (ref 5–15)
BUN: 14 mg/dL (ref 8–23)
CO2: 27 mmol/L (ref 22–32)
Calcium: 9.2 mg/dL (ref 8.9–10.3)
Chloride: 95 mmol/L — ABNORMAL LOW (ref 98–111)
Creatinine, Ser: 0.56 mg/dL (ref 0.44–1.00)
GFR calc Af Amer: >60 mL/min (ref >60)
GFR calc non Af Amer: >60 mL/min (ref >60)
Glucose, Bld: 204 mg/dL — ABNORMAL HIGH (ref 70–99)
Potassium: 4.4 mmol/L (ref 3.5–5.1)
Sodium: 135 mmol/L (ref 135–145)
Total Bilirubin: 0.4 mg/dL (ref 0.3–1.2)
Total Protein: 6.6 g/dL (ref 6.5–8.1)

## 2020-05-08 LAB — GLUCOSE, CAPILLARY
Glucose-Capillary: 166 mg/dL — ABNORMAL HIGH (ref 70–99)
Glucose-Capillary: 297 mg/dL — ABNORMAL HIGH (ref 70–99)

## 2020-05-08 LAB — C-REACTIVE PROTEIN: CRP: 1.5 mg/dL — ABNORMAL HIGH (ref <1.0)

## 2020-05-08 LAB — D-DIMER, QUANTITATIVE: D-Dimer, Quant: 0.79 ug/mL-FEU — ABNORMAL HIGH (ref 0.00–0.50)

## 2020-05-08 LAB — LACTATE DEHYDROGENASE: LDH: 238 U/L — ABNORMAL HIGH (ref 98–192)

## 2020-05-08 LAB — PHOSPHORUS: Phosphorus: 2.8 mg/dL (ref 2.5–4.6)

## 2020-05-08 LAB — MAGNESIUM: Magnesium: 2.2 mg/dL (ref 1.7–2.4)

## 2020-05-08 LAB — FERRITIN: Ferritin: 75 ng/mL (ref 11–307)

## 2020-05-08 MED ORDER — ASCORBIC ACID 500 MG PO TABS: 500.0000 mg | ORAL_TABLET | Freq: Every day | ORAL | 0 refills | Status: DC

## 2020-05-08 MED ORDER — CEFIXIME 400 MG PO CAPS: 400.0000 mg | ORAL_CAPSULE | Freq: Once | ORAL | 0 refills | Status: AC

## 2020-05-08 MED ORDER — HYDRALAZINE HCL 10 MG PO TABS
10.0000 mg | ORAL_TABLET | Freq: Four times a day (QID) | ORAL | Status: DC
  Administered 2020-05-08: 10 mg via ORAL
  Filled 2020-05-08: qty 1

## 2020-05-08 MED ORDER — HYDRALAZINE HCL 10 MG PO TABS: 10.0000 mg | ORAL_TABLET | Freq: Four times a day (QID) | ORAL | 0 refills | Status: DC

## 2020-05-08 MED ORDER — CEFIXIME 400 MG PO CAPS: 400.0000 mg | ORAL_CAPSULE | Freq: Once | ORAL | Status: DC

## 2020-05-08 MED ORDER — CEFIXIME 400 MG PO CAPS: 400.0000 mg | ORAL_CAPSULE | Freq: Once | ORAL | 0 refills | Status: DC

## 2020-05-08 MED ORDER — ZINC SULFATE 220 (50 ZN) MG PO CAPS: 220.0000 mg | ORAL_CAPSULE | Freq: Every day | ORAL | 0 refills | Status: DC

## 2020-05-08 MED ORDER — CEFIXIME 400 MG PO CAPS
400.0000 mg | ORAL_CAPSULE | Freq: Once | ORAL | Status: AC
  Administered 2020-05-08: 400 mg via ORAL
  Filled 2020-05-08: qty 1

## 2020-05-08 MED FILL — CEFIXIME 400 MG CAPS: 400 | 1 days supply | Qty: 1 | Fill #0

## 2020-05-08 MED FILL — ZINC SULFATE 220 MG TABLET: 220 (50 ZN) | 30 days supply | Qty: 30 | Fill #0

## 2020-05-08 MED FILL — hydrALAZINE HCL 10 MG TABS: 10 | 30 days supply | Qty: 120 | Fill #0

## 2020-05-08 MED FILL — VITAMIN C 500 MG TABLET: 500 | 30 days supply | Qty: 30 | Fill #0

## 2020-05-08 NOTE — Consult Note (Signed)
[facility] Inpatient Consult   05/08/2020  Amy Barnes 1950/10/31 [id number]  [facility] [[facility]] ACO Medicare Next Gen and  Patient: Active and Primary Care Provider [name], MD   Patient is currently active with [facility] [[facility]] Care Management for chronic disease management services.  Patient has been engaged by a [facility] RN Care Management Coordinator.   Our community based plan of care has focused on disease management and community resource support.    Patient will receive a post hospital call and will be evaluated for assessments and disease process education.    Plan: Update assigned [facility] RN CM of disposition, patient's primary care provider does the transition of care follow up needs.   Of note, [facility] Care Management services does not replace or interfere with any services that are needed or arranged by inpatient [facility] care management team.  For additional questions or referrals please contact:  Charlesetta Shanks, RN BSN CCM [facility]  [phone] business mobile phone Toll free office [phone]  Fax number: [phone] [email or web address]

## 2020-05-08 NOTE — Discharge Summary (Signed)
Physician Discharge Summary  Amy Barnes XAJ:[id number] DOB: Apr 21, 1951 DOA: 05/03/2020  PCP: Amy Boyden, MD  Admit date: 05/03/2020 Discharge date: 05/08/2020  Time spent: 30 minutes  Recommendations for Outpatient Follow-up:   Covid vaccination; no vaccine   Acute respiratory failure with hypoxia/Covid 19 pneumonia COVID-19 Labs  Recent Labs    05/06/20 0416 05/07/20 0500  DDIMER 0.72* 0.74*  FERRITIN 81 65  LDH 223* 240*  CRP 5.6* 2.7*    Lab Results  Component Value Date   SARSCOV2NAA POSITIVE (A) 05/03/2020   SARSCOV2NAA NEGATIVE 01/21/2020   SARSCOV2NAA NEGATIVE 12/23/2019   SARSCOV2NAA NEGATIVE 11/09/2019   -Solu-Medrol 86.8 mg BID -Remdesivir per pharmacy protocol -Respimat -Flutter valve -Incentive spirometry -Prone 8 hours/day; if patient cannot tolerate prone 2 to 3 hours per shift -Titrate O2 to maintain SPO2> 94% -500 mg daily -Zinc 220 mg daily -8/8 CRP trending down no need for Actemra at this time -ambulatory SPO2 pending SATURATION QUALIFICATIONS: (This note is used to comply with regulatory documentation for home oxygen) Patient Saturations on Room Air at Rest = 100% Patient Saturations on [employer] while Ambulating = 94% Patient Saturations on [place] of oxygen while Ambulating = 100% -Patient does not meet guidelines for home O2 -Patient counseled that she will be contagious until 8/26, and should take universal precautions. -Patient also counseled that after 3 months she should take the Covid vaccine however this is unlikely to happen as she believes that the vaccine contains robots which allow the government to track you. -Follow-up with Dr. 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 Judyann Barnes, ID who verified that should remain on doxycycline secondary to chronic MRSA in her hardware in her back. -Doxycycline 100 mg BID  Essential HTN -Coreg 6.25 mg BID -8/8 increase Losartan 75 mg daily  -8/10 Hydralazine 10 mg QID  Moderate LVH -Per echocardiogram 5/24 showed moderate LVH -Follow-up with Dr. Rollene Barnes cardiology essential HTN, moderate LVH, CAD.  [facility] will contact you with appointment information  CAD -ASA 81 mg daily -Patient states her cardiologist is Dr. Antoine Barnes, and has had a couple of echocardiograms and EKGs in the past in order for [place] to check her abnormal EKG against.  Abnormal EKG -EKG with flipped T waves in leads I and aVL, investigate old vs new -Obtain troponin; consistent with demand ischemia Results for Amy Barnes" (MRN [id number]) as of 05/08/2020 09:22  Ref. Range 05/04/2020 11:32 05/04/2020 14:53  Troponin I (High Sensitivity) Latest Ref Range: <18 ng/L 84 (H) 56 (H)    Diarrhea -Lactoferrin pending -8/6 GI panel by PCR pending  DM type II uncontrolled with complication/DM peripheral neuropathy -8/7 Hemoglobin A1c= 7.3 -Moderate SSI  HLD -8/7 LDL = 58   Bipolar DO/Anxiety -Fluoxetine 40 mg daily -Lamictal  200 mg daily -Trileptal 300 mg daily  Morbid obesity with BMI of 60.0-69.9, adult (HCC) Patient will need to follow with PCP for dietary lifestyle inventions for weight loss. Patient may benefit from referral to bariatric surgery for evaluation   Discharge Diagnoses:  Principal Problem:   Pneumonia due to COVID-19 virus Active Problems:  DM type 2 with diabetic peripheral neuropathy (HCC)   Hyperlipidemia associated with type 2 diabetes mellitus (HCC)   Bipolar disorder (HCC)   Essential hypertension   OSA on  CPAP   Morbid obesity with BMI of 60.0-69.9, adult (HCC)   CAD (coronary artery disease)   Hypoxia   Acute respiratory failure with hypoxia (HCC)   OSA (obstructive sleep apnea)   Obesity hypoventilation syndrome (HCC)   LVH (left ventricular hypertrophy)   HLD (hyperlipidemia)   Discharge Condition: Stable  Diet recommendation: Heart healthy/carb modified  Filed Weights   05/03/20 1516 05/04/20 0139 05/08/20 0445  Weight: (!) 170.1 kg (!) 173.5 kg (!) 169.8 kg    History of present illness:  68 y.o.WF PMHx DM TypeII , OSA, HTN, allergic rhinitis, hypothyroidism, COPD, bipolar disorder, HLD,  Presented to [facility] August 5 for evaluation of shortness of breath with subjective fever. She reports that she was not feeling well for 2 to 3 days before she went to the emergency room and had progression of symptoms over the previous few days. Stated she a home COVID-19 test that she got at the pharmacy which came back positive. She reports that she was checking her pulse oximetry at home and it would drop to approximately 80% with her shortness of breath.In the emergency room Covid swab was positive. Patient required supplemental oxygen to maintain O2 sat greater than 92%. She was started on remdesivir and Solu-Medrol was transferred to [facility] for further management. She reports that she had subjective fever and chills but no nausea or vomiting.  She reports that family members in her house have also been sick with cough congestion and fever. She has not had the COVID-19 vaccine.   Hospital Course:  See above  Ventilator settings Nasal cannula 9/8 Room air SPO2; 94%   Cultures  8/5 SARS coronavirus positive 8/5 blood left forearm NGTD 8/5 blood LEFT AC NGTD 8/7 urine positive Proteus mirabilis 8/6 GI panel by PCR pending   Antibiotics Anti-infectives (From admission, onward)   Start     Ordered Stop   05/09/20 1000  cefixime (SUPRAX) capsule 400 mg      Discontinue     05/08/20 0944     05/08/20 0930  cefixime (SUPRAX) capsule 400 mg     Discontinue     05/08/20 0928     05/07/20 2200  doxycycline (VIBRA-TABS) tablet 100 mg     Discontinue     05/07/20 1629     05/05/20 1730  cefTRIAXone (ROCEPHIN) 1 g in sodium chloride 0.9 % 100 mL IVPB  Status:  Discontinued        05/05/20 1719 05/08/20 0928   05/05/20 1000  remdesivir 100 mg in sodium chloride 0.9 % 100 mL IVPB  Status:  Discontinued       "Followed by" Linked Group Details   05/04/20 0419 05/04/20 0422   05/04/20 1000  remdesivir 100 mg in sodium chloride 0.9 % 100 mL IVPB  Status:  Discontinued       "Followed by" Linked Group Details   05/03/20 1835 05/03/20 1902   05/04/20 1000  remdesivir 100 mg in sodium chloride 0.9 % 100 mL IVPB        05/03/20 1902 05/07/20 0940   05/04/20 0419  remdesivir 200 mg in sodium chloride 0.9% 250 mL IVPB  Status:  Discontinued       "Followed by" Linked Group Details   05/04/20 0419 05/04/20 0422   05/03/20 2000  remdesivir 100 mg in sodium chloride 0.9 % 100 mL IVPB  Status:  Discontinued       "And" Linked Group Details   05/03/20 1902 05/04/20 0753   05/03/20 1915  remdesivir 100 mg in sodium chloride 0.9 % 100 mL IVPB  Status:  Discontinued       "And" Linked Group Details   05/03/20 1902 05/04/20 0753   05/03/20 1845  remdesivir 200 mg in sodium chloride 0.9% 250 mL IVPB  Status:  Discontinued       "Followed by" Linked Group Details   05/03/20 1835 05/03/20 1902       Discharge Exam: Vitals:   05/07/20 1400 05/07/20 2122 05/07/20 2356 05/08/20 0445  BP: (!) 170/74 (!) 158/83 (!) 158/83 (!) 171/86  Pulse: 71 61 (!) 54 61  Resp: 17 19 15 17   Temp: 97.8 F (36.6 C) 98.7 F (37.1 C)  97.8 F (36.6 C)  TempSrc: Oral Oral  Axillary  SpO2: 94% 93% 94% 93%  Weight:    (!) 169.8 kg  Height:        General: A/O x4, positive acute respiratory distress Eyes: negative scleral hemorrhage, negative anisocoria, negative icterus ENT:  Negative Runny nose, negative gingival bleeding, Neck:  Negative scars, masses, torticollis, lymphadenopathy, JVD Lungs: tachypneic decreased breath sounds bilaterally without wheezes or crackles Cardiovascular: Regular rate and rhythm without murmur gallop or rub normal S1 and S2 Abdomen: MORBIDLY OBESE abdominal pain, nondistended, positive soft, bowel sounds, no rebound, no ascites, no appreciable mass   Discharge Instructions   Allergies as of 05/08/2020      Reactions   Toviaz [fesoterodine] Nausea And Vomiting   Severe reaction - s/p several ER visits then hospitalization ?stress induced cardiomyopathy   Cephalexin Hives   Hydrocodone Other (See Comments)   Reaction:  Hallucinations    Entresto [sacubitril-valsartan]    Tolterodine Tartrate Nausea And Vomiting   Risperidone And Related Other (See Comments)   Reaction:  Made pt excessively sleepy   Seroquel [quetiapine Fumarate] Other (See Comments)   Reaction:  Made pt excessively sleepy   Sulfa Antibiotics Rash      Medication List    STOP taking these medications   docusate sodium 100 MG capsule Commonly known as: COLACE   promethazine 25 MG suppository Commonly known as: Phenergan   sucralfate 1 GM/10ML suspension Commonly known as: Carafate     TAKE these medications   albuterol 108 (90 Base) MCG/ACT inhaler Commonly known as: VENTOLIN HFA Inhale 2 puffs into the lungs every 6 (six) hours as needed for wheezing or shortness of breath.   ascorbic acid 500 MG tablet Commonly known as: VITAMIN C Take 1 tablet (500 mg total) by mouth daily. Start taking on: May 09, 2020   aspirin 81 MG EC tablet Take 81 mg by mouth at bedtime.   budesonide-formoterol 160-4.5 MCG/ACT inhaler Commonly known as: SYMBICORT Inhale 2 puffs into the lungs 2 (two) times daily.   carvedilol 6.25 MG tablet Commonly known as: COREG Take 1 tablet (6.25 mg total) by mouth 2 (two) times daily.   cefixime 400 MG Caps  capsule Commonly known as: SUPRAX Take 1 capsule (400 mg total) by mouth once for 1 dose.   celecoxib 200 MG capsule Commonly known as: CeleBREX Take 1 capsule (200 mg total) by mouth daily.   dicyclomine 20 MG tablet Commonly known as: BENTYL Take 1 tablet (20 mg total) by mouth in the morning and at bedtime.   doxycycline 100  MG tablet Commonly known as: VIBRA-TABS Take 1 tablet (100 mg total) by mouth 2 (two) times daily.   fluconazole 200 MG tablet Commonly known as: DIFLUCAN Take 1 tablet (200 mg total) by mouth daily. X 5 days as needed for yeast infection   FLUoxetine 40 MG capsule Commonly known as: PROZAC Take by mouth daily.   furosemide 20 MG tablet Commonly known as: LASIX Take 1 tablet (20 mg total) by mouth daily as needed for edema.   hydrALAZINE 10 MG tablet Commonly known as: APRESOLINE Take 1 tablet (10 mg total) by mouth every 6 (six) hours.   lamoTRIgine 200 MG tablet Commonly known as: LAMICTAL Take 200 mg by mouth daily.   levothyroxine 150 MCG tablet Commonly known as: SYNTHROID TAKE ONE TABLET BY MOUTH AT BEDTIME   losartan 50 MG tablet Commonly known as: COZAAR Take 1 tablet (50 mg total) by mouth daily.   metFORMIN 500 MG tablet Commonly known as: GLUCOPHAGE Take 1 tablet (500 mg total) by mouth every evening.   montelukast 10 MG tablet Commonly known as: SINGULAIR Take 1 tablet (10 mg total) by mouth at bedtime.   multivitamin with minerals Tabs tablet Take 2 tablets by mouth daily.   ondansetron 8 MG disintegrating tablet Commonly known as: Zofran ODT Take 1 tablet (8 mg total) by mouth 2 (two) times daily as needed for nausea or vomiting.   Oxcarbazepine 300 MG tablet Commonly known as: TRILEPTAL Take 300-600 mg by mouth See admin instructions. Take 300 mg by mouth in the morning and 600 mg at night   oxybutynin 10 MG 24 hr tablet Commonly known as: DITROPAN-XL Take 1 tablet (10 mg total) by mouth daily.   pantoprazole 40  MG tablet Commonly known as: PROTONIX Take 1 tablet (40 mg total) by mouth 2 (two) times daily. What changed: when to take this   polyethylene glycol 17 g packet Commonly known as: MIRALAX / GLYCOLAX Take 17 g by mouth 2 (two) times daily as needed for mild constipation.   prochlorperazine 10 MG tablet Commonly known as: COMPAZINE Take 1 tablet (10 mg total) by mouth every 6 (six) hours as needed for nausea or vomiting.   simvastatin 40 MG tablet Commonly known as: ZOCOR Take 1 tablet (40 mg total) by mouth every evening.   zaleplon 10 MG capsule Commonly known as: SONATA Take 10 mg by mouth at bedtime as needed for sleep.   zinc sulfate 220 (50 Zn) MG capsule Take 1 capsule (220 mg total) by mouth daily. Start taking on: May 09, 2020      Allergies  Allergen Reactions  . Gala Murdoch [Fesoterodine] Nausea And Vomiting    Severe reaction - s/p several ER visits then hospitalization ?stress induced cardiomyopathy  . Cephalexin Hives  . Hydrocodone Other (See Comments)    Reaction:  Hallucinations   . Entresto [Sacubitril-Valsartan]   . Tolterodine Tartrate Nausea And Vomiting  . Risperidone And Related Other (See Comments)    Reaction:  Made pt excessively sleepy  . Seroquel [Quetiapine Fumarate] Other (See Comments)    Reaction:  Made pt excessively sleepy  . Sulfa Antibiotics Rash    Follow-up Information    Amy Boyden, MD. Schedule an appointment as soon as possible for a visit in 3 week(s).   Specialty: Family Medicine Why: Follow-up with PCP in 3 weeks Covid pneumonia, essential HTN, OSA/OHS, Contact information: [address] [place] [address] [phone]        Amy Rotunda,  MD .   Specialty: Cardiology Contact information: [address] [place] [address] [phone]                The results of significant diagnostics from this hospitalization (including imaging, microbiology, ancillary and laboratory)  are listed below for reference.    Significant Diagnostic Studies: DG Chest Port 1 View  Result Date: 05/03/2020 CLINICAL DATA:  Shortness of breath.  Hypoxia.  COVID-19 positive. EXAM: PORTABLE CHEST 1 VIEW COMPARISON:  Chest radiograph 10/31/2019 and CTA 11/09/2019 FINDINGS: The cardiac silhouette remains mildly enlarged. Aortic atherosclerosis is noted. There is persistent mild elevation of the right hemidiaphragm. Compared to the prior radiograph, there is increased pulmonary vascular congestion with mild interstitial densities bilaterally. No sizable pleural effusion or pneumothorax is identified. Prior cervical fusion is noted. IMPRESSION: Cardiomegaly with pulmonary vascular congestion and mild bilateral interstitial opacities which may reflect edema or atypical/viral infection. Electronically Signed   By: Sebastian Ache M.D.   On: 05/03/2020 15:42   SLEEP STUDY DOCUMENTS  Result Date: 04/25/2020 Ordered by an unspecified provider.  SLEEP STUDY DOCUMENTS  Result Date: 04/18/2020 Ordered by an unspecified provider.   Microbiology: Recent Results (from the past 240 hour(s))  Blood Culture (routine x 2)     Status: None (Preliminary result)   Collection Time: 05/03/20  3:30 PM   Specimen: BLOOD  Result Value Ref Range Status   Specimen Description   Final    BLOOD BLOOD LEFT FOREARM Performed at [facility], [address] [place], [place] [address]    Special Requests   Final    BOTTLES DRAWN AEROBIC ONLY Blood Culture results may not be optimal due to an inadequate volume of blood received in culture bottles Performed at [facility], [address] [place], [place] [address]    Culture   Final    NO GROWTH 4 DAYS Performed at [facility], [address], [place], [place] [address]    Report Status PENDING  Incomplete  Blood Culture (routine x 2)     Status: None (Preliminary result)   Collection Time: 05/03/20  3:41 PM   Specimen: BLOOD   Result Value Ref Range Status   Specimen Description   Final    BLOOD LEFT ANTECUBITAL Performed at [facility], [address] [place], [place] [address]    Special Requests   Final    BOTTLES DRAWN AEROBIC AND ANAEROBIC Blood Culture adequate volume Performed at [facility], [address] [place], [place] [address]    Culture   Final    NO GROWTH 4 DAYS Performed at [facility], [address] [place], [place] [address]    Report Status PENDING  Incomplete  SARS Coronavirus 2 by RT PCR (hospital order, performed in [facility] hospital lab) Nasopharyngeal Nasopharyngeal Swab     Status: Abnormal   Collection Time: 05/03/20  4:32 PM   Specimen: Nasopharyngeal Swab  Result Value Ref Range Status   SARS Coronavirus 2 POSITIVE (A) NEGATIVE Final    Comment: RESULT CALLED TO, READ BACK BY AND VERIFIED WITH: JAMIE BAILEY RN  05/03/2020 OLSONM (NOTE) SARS-CoV-2 target nucleic acids are DETECTED  SARS-CoV-2 RNA is generally detectable in upper respiratory specimens  during the acute phase of infection.  Positive results are indicative  of the presence of the identified virus, but do not rule out bacterial infection or co-infection with other pathogens  not detected by the test.  Clinical correlation with patient history and  other diagnostic information is necessary to determine patient infection status.  The expected result is negative.  Fact Sheet for Patients:   BoilerBrush.com.cy   Fact Sheet for Healthcare Providers:   https://pope.com/    This test is not yet approved or cleared by the [place] FDA and  has been authorized for detection and/or diagnosis of SARS-CoV-2 by FDA under an Emergency Use Authorization (EUA).  This EUA will remain in effect (meaning th is test can be used) for the duration of  the COVID-19 declaration under Section 564(b)(1) of the Act, 21 U.S.C. section  360-bbb-3(b)(1), unless the authorization is terminated or revoked sooner.  Performed at [facility], [address] [place], [place] [address]   Culture, Urine     Status: Abnormal   Collection Time: 05/05/20  2:47 PM   Specimen: Urine, Clean Catch  Result Value Ref Range Status   Specimen Description URINE, CLEAN CATCH  Final   Special Requests   Final    NONE Performed at [facility], [address] [place], [place] [address]    Culture >=100,000 COLONIES/mL PROTEUS MIRABILIS (A)  Final   Report Status 05/07/2020 FINAL  Final   Organism ID, Bacteria PROTEUS MIRABILIS (A)  Final      Susceptibility   Proteus mirabilis - MIC*    AMPICILLIN <=2 SENSITIVE Sensitive     CEFAZOLIN <=4 SENSITIVE Sensitive     CEFTRIAXONE <=0.25 SENSITIVE Sensitive     CIPROFLOXACIN <=0.25 SENSITIVE Sensitive     GENTAMICIN <=1 SENSITIVE Sensitive     IMIPENEM 4 SENSITIVE Sensitive     NITROFURANTOIN 128 RESISTANT Resistant     TRIMETH/SULFA <=20 SENSITIVE Sensitive     AMPICILLIN/SULBACTAM <=2 SENSITIVE Sensitive     * >=100,000 COLONIES/mL PROTEUS MIRABILIS     Labs: Basic Metabolic Panel: Recent Labs  Lab 05/03/20 1541 05/04/20 0508 05/05/20 0419 05/06/20 0416 05/07/20 0500  NA 134* 134* 135 137 136  K 3.8 5.0 4.5 4.7 4.4  CL 95* 99 97* 98 97*  CO2 27 21* 27 29 26   GLUCOSE 129* 151* 167* 210* 226*  BUN 13 14 26* 24* 20  CREATININE 0.73 0.70 0.95 0.70 0.64  CALCIUM 8.8* 8.6* 9.0 8.8* 8.9  MG  --  1.9 2.1 2.0 2.2  PHOS  --  4.2 4.2 3.1 2.7   Liver Function Tests: Recent Labs  Lab 05/03/20 1541 05/04/20 0508 05/05/20 0419 05/06/20 0416 05/07/20 0500  AST 58* 70* 57* 42* 35  ALT 53* 60* 60* 53* 50*  ALKPHOS 95 80 81 70 68  BILITOT 0.7 0.6 0.6 0.5 0.4  PROT 7.0 6.4* 6.7 6.1* 6.5  ALBUMIN 4.1 3.5 3.6 3.3* 3.3*   No results for input(s): LIPASE, AMYLASE in the last 168 hours. No results for input(s): AMMONIA in the last 168 hours. CBC: Recent Labs   Lab 05/03/20 1541 05/04/20 0508 05/05/20 0419 05/06/20 0416 05/07/20 0500  WBC 4.3 6.5 4.2 4.3 4.5  NEUTROABS 2.8 4.7 3.1 3.3 3.4  HGB 10.7* 10.4* 10.4* 10.4* 10.3*  HCT 34.1* 34.8* 33.8* 32.5* 33.2*  MCV 87.2 91.1 88.9 88.3 87.4  PLT 182 163 170 200 174   Cardiac Enzymes: No results for input(s): CKTOTAL, CKMB, CKMBINDEX, TROPONINI in the last 168 hours. BNP: BNP (last 3 results) Recent Labs    11/09/19 0504 11/11/19 0321  BNP 565.6* 143.6*    ProBNP (last  3 results) No results for input(s): PROBNP in the last 8760 hours.  CBG: Recent Labs  Lab 05/07/20 0738 05/07/20 1156 05/07/20 1642 05/07/20 2101 05/08/20 0725  GLUCAP 188* 268* 247* 239* 166*       Signed:  Carolyne Littles, MD [facility] [phone] pager

## 2020-05-09 ENCOUNTER — Telehealth: Payer: Self-pay

## 2020-05-09 NOTE — Telephone Encounter (Signed)
Transition Care Management Follow-up Telephone Call  Date of discharge and from where: 05/08/2020, [facility]  How have you been since you were released from the hospital? Patient states that she is feeling better since her discharge.  Any questions or concerns? No  Items Reviewed:  Did the pt receive and understand the discharge instructions provided? Yes   Medications obtained and verified? Yes   Any new allergies since your discharge? No   Dietary orders reviewed? Yes  Do you have support at home? Yes   Functional Questionnaire: (I = Independent and D = Dependent) ADLs: I  Bathing/Dressing- I  Meal Prep- I  Eating- I  Maintaining continence- I  Transferring/Ambulation- I  Managing Meds- I  Follow up appointments reviewed:   PCP Hospital f/u appt confirmed? No  Patient wants to wait until her energy level improves before coming in the office. Will call back at a later date to schedule appointment.  Specialist Hospital f/u appt confirmed? No  .  Are transportation arrangements needed? No   If their condition worsens, is the pt aware to call PCP or go to the Emergency Dept.? Yes  Was the patient provided with contact information for the PCP's office or ED? Yes  Was to pt encouraged to call back with questions or concerns? Yes

## 2020-05-09 NOTE — Telephone Encounter (Signed)
1st attempt- Left message on voicemail to return call- need to complete TCM and schedule hospital follow up visit.  

## 2020-05-10 ENCOUNTER — Other Ambulatory Visit: Payer: Self-pay | Admitting: *Deleted

## 2020-05-10 NOTE — Patient Outreach (Signed)
[facility]) Care Management  05/10/2020  Amy Barnes 21-May-1951 [id number]   Call placed to member to follow up on discharge after being hospitalized with Covid infection.  Report she is feeling better, oxygen levels have remained greater than 90%, denies being discharged home with oxygen.  She has been trying to increase her appetite as well as fluid (warm/hot preferred) intake to help with production of secretions.  State it will take some time to recover from infection.  This care manager inquired about the Covid vaccination, state she hasn't thought much about it since being home, focusing on her recovery.  Report she has continued to manage her chronic medical conditions, however hasn't been able to check blood sugar yet today.  Blood pressure was 148/77.  She is still having some chronic pain issues, relieved with medications last night.  Denies any urgent concerns.  Will send EMMI education regarding Covid recovery and follow up within the next month.    Goals Addressed              This Visit's Progress   .  Recover from Covid (pt-stated)        CARE PLAN ENTRY (see longtitudinal plan of care for additional care plan information)  Current Barriers:  Marland Kitchen Knowledge Deficits related to COVID-19 and impact on patient self health management  Clinical Goal(s):  Marland Kitchen Over the next 30 days, patient will verbalize basic understanding of COVID-19 impact on individual health and self health management as evidenced by verbalization of basic understanding of COVID-19 as a viral disease, measures to prevent exposure, signs and symptoms, when to contact provider  Interventions: . Advised patient to call PCP for follow up . Discussed plans with patient for ongoing care management follow up and provided patient with direct contact information for care management team . Provided patient with EMMI  educational materials related to Covid recovery  Patient Self Care Activities:   . Attends all scheduled provider appointments . Performs ADL's independently  Initial goal documentation                     Kemper Durie, RN, MSN [facility] Care Management  [facility] Manager [phone]

## 2020-05-11 ENCOUNTER — Telehealth: Payer: Self-pay

## 2020-05-11 DIAGNOSIS — J9601 Acute respiratory failure with hypoxia: Secondary | ICD-10-CM

## 2020-05-11 DIAGNOSIS — J1282 Pneumonia due to coronavirus disease 2019: Secondary | ICD-10-CM

## 2020-05-11 NOTE — Telephone Encounter (Signed)
[name], pt's daughter left msg on VM requesting order for O2 to have at home.... pt was d/c from hospital on 05/08/2020 following testing positive for Covid.... daughter reports pt's O2 Sat is dropping although levels have remained above 90%  Looks like [facility] reached out to schedule a Hosp f/u but it was declined at this time due to pt feeling weak...  Please advise

## 2020-05-11 NOTE — Telephone Encounter (Signed)
How is she feeling since she's been home? Ensure no worsening fever, dyspnea, cough.  Did not need oxygen when discharged from the hospital.  I know goal was O2 sats >94%, but if maintaining >90% off oxygen, oxygen may not be covered by insurance. I have ordered and will see what they say.

## 2020-05-12 ENCOUNTER — Emergency Department (HOSPITAL_COMMUNITY): Payer: Medicare Other

## 2020-05-12 ENCOUNTER — Other Ambulatory Visit: Payer: Self-pay

## 2020-05-12 ENCOUNTER — Encounter (HOSPITAL_COMMUNITY): Payer: Self-pay | Admitting: Emergency Medicine

## 2020-05-12 ENCOUNTER — Inpatient Hospital Stay (HOSPITAL_COMMUNITY)
Admission: EM | Admit: 2020-05-12 | Discharge: 2020-05-22 | DRG: 177 | Disposition: A | Discharge status: Home Health | Payer: Medicare Other | Attending: Internal Medicine | Admitting: Internal Medicine

## 2020-05-12 DIAGNOSIS — Z885 Allergy status to narcotic agent status: Secondary | ICD-10-CM

## 2020-05-12 DIAGNOSIS — U071 COVID-19: Secondary | ICD-10-CM | POA: Diagnosis not present

## 2020-05-12 DIAGNOSIS — I447 Left bundle-branch block, unspecified: Secondary | ICD-10-CM | POA: Diagnosis present

## 2020-05-12 DIAGNOSIS — I2510 Atherosclerotic heart disease of native coronary artery without angina pectoris: Secondary | ICD-10-CM | POA: Diagnosis present

## 2020-05-12 DIAGNOSIS — J4489 Other specified chronic obstructive pulmonary disease: Secondary | ICD-10-CM | POA: Diagnosis present

## 2020-05-12 DIAGNOSIS — Z7984 Long term (current) use of oral hypoglycemic drugs: Secondary | ICD-10-CM

## 2020-05-12 DIAGNOSIS — K219 Gastro-esophageal reflux disease without esophagitis: Secondary | ICD-10-CM | POA: Diagnosis present

## 2020-05-12 DIAGNOSIS — Z981 Arthrodesis status: Secondary | ICD-10-CM

## 2020-05-12 DIAGNOSIS — Z79890 Hormone replacement therapy (postmenopausal): Secondary | ICD-10-CM | POA: Diagnosis not present

## 2020-05-12 DIAGNOSIS — J9601 Acute respiratory failure with hypoxia: Secondary | ICD-10-CM | POA: Diagnosis present

## 2020-05-12 DIAGNOSIS — M533 Sacrococcygeal disorders, not elsewhere classified: Secondary | ICD-10-CM | POA: Diagnosis not present

## 2020-05-12 DIAGNOSIS — I10 Essential (primary) hypertension: Secondary | ICD-10-CM

## 2020-05-12 DIAGNOSIS — E1142 Type 2 diabetes mellitus with diabetic polyneuropathy: Secondary | ICD-10-CM | POA: Diagnosis present

## 2020-05-12 DIAGNOSIS — R5381 Other malaise: Secondary | ICD-10-CM | POA: Diagnosis present

## 2020-05-12 DIAGNOSIS — E039 Hypothyroidism, unspecified: Secondary | ICD-10-CM | POA: Diagnosis not present

## 2020-05-12 DIAGNOSIS — R0600 Dyspnea, unspecified: Secondary | ICD-10-CM | POA: Diagnosis not present

## 2020-05-12 DIAGNOSIS — E785 Hyperlipidemia, unspecified: Secondary | ICD-10-CM | POA: Diagnosis present

## 2020-05-12 DIAGNOSIS — I517 Cardiomegaly: Secondary | ICD-10-CM | POA: Diagnosis not present

## 2020-05-12 DIAGNOSIS — J189 Pneumonia, unspecified organism: Secondary | ICD-10-CM | POA: Diagnosis not present

## 2020-05-12 DIAGNOSIS — I428 Other cardiomyopathies: Secondary | ICD-10-CM | POA: Diagnosis present

## 2020-05-12 DIAGNOSIS — Z96643 Presence of artificial hip joint, bilateral: Secondary | ICD-10-CM | POA: Diagnosis present

## 2020-05-12 DIAGNOSIS — Z90711 Acquired absence of uterus with remaining cervical stump: Secondary | ICD-10-CM

## 2020-05-12 DIAGNOSIS — Z961 Presence of intraocular lens: Secondary | ICD-10-CM | POA: Diagnosis present

## 2020-05-12 DIAGNOSIS — E222 Syndrome of inappropriate secretion of antidiuretic hormone: Secondary | ICD-10-CM | POA: Diagnosis present

## 2020-05-12 DIAGNOSIS — R0602 Shortness of breath: Secondary | ICD-10-CM | POA: Diagnosis not present

## 2020-05-12 DIAGNOSIS — I700 Atherosclerosis of aorta: Secondary | ICD-10-CM | POA: Diagnosis present

## 2020-05-12 DIAGNOSIS — I119 Hypertensive heart disease without heart failure: Secondary | ICD-10-CM | POA: Diagnosis present

## 2020-05-12 DIAGNOSIS — Z9842 Cataract extraction status, left eye: Secondary | ICD-10-CM | POA: Diagnosis not present

## 2020-05-12 DIAGNOSIS — Z809 Family history of malignant neoplasm, unspecified: Secondary | ICD-10-CM

## 2020-05-12 DIAGNOSIS — S79912A Unspecified injury of left hip, initial encounter: Secondary | ICD-10-CM | POA: Diagnosis not present

## 2020-05-12 DIAGNOSIS — Z888 Allergy status to other drugs, medicaments and biological substances status: Secondary | ICD-10-CM

## 2020-05-12 DIAGNOSIS — J432 Centrilobular emphysema: Secondary | ICD-10-CM | POA: Diagnosis not present

## 2020-05-12 DIAGNOSIS — Z8614 Personal history of Methicillin resistant Staphylococcus aureus infection: Secondary | ICD-10-CM

## 2020-05-12 DIAGNOSIS — E869 Volume depletion, unspecified: Secondary | ICD-10-CM | POA: Diagnosis present

## 2020-05-12 DIAGNOSIS — Z833 Family history of diabetes mellitus: Secondary | ICD-10-CM

## 2020-05-12 DIAGNOSIS — J1282 Pneumonia due to coronavirus disease 2019: Secondary | ICD-10-CM | POA: Diagnosis not present

## 2020-05-12 DIAGNOSIS — E7800 Pure hypercholesterolemia, unspecified: Secondary | ICD-10-CM | POA: Diagnosis not present

## 2020-05-12 DIAGNOSIS — M1611 Unilateral primary osteoarthritis, right hip: Secondary | ICD-10-CM | POA: Diagnosis present

## 2020-05-12 DIAGNOSIS — Z6841 Body Mass Index (BMI) 40.0 and over, adult: Secondary | ICD-10-CM | POA: Diagnosis not present

## 2020-05-12 DIAGNOSIS — Z79899 Other long term (current) drug therapy: Secondary | ICD-10-CM

## 2020-05-12 DIAGNOSIS — Z87891 Personal history of nicotine dependence: Secondary | ICD-10-CM

## 2020-05-12 DIAGNOSIS — Z22322 Carrier or suspected carrier of Methicillin resistant Staphylococcus aureus: Secondary | ICD-10-CM

## 2020-05-12 DIAGNOSIS — Z7982 Long term (current) use of aspirin: Secondary | ICD-10-CM

## 2020-05-12 DIAGNOSIS — Z9841 Cataract extraction status, right eye: Secondary | ICD-10-CM | POA: Diagnosis not present

## 2020-05-12 DIAGNOSIS — E871 Hypo-osmolality and hyponatremia: Secondary | ICD-10-CM | POA: Diagnosis present

## 2020-05-12 DIAGNOSIS — W19XXXA Unspecified fall, initial encounter: Secondary | ICD-10-CM | POA: Diagnosis present

## 2020-05-12 DIAGNOSIS — M85851 Other specified disorders of bone density and structure, right thigh: Secondary | ICD-10-CM | POA: Diagnosis present

## 2020-05-12 DIAGNOSIS — F319 Bipolar disorder, unspecified: Secondary | ICD-10-CM | POA: Diagnosis present

## 2020-05-12 DIAGNOSIS — Z811 Family history of alcohol abuse and dependence: Secondary | ICD-10-CM

## 2020-05-12 DIAGNOSIS — E662 Morbid (severe) obesity with alveolar hypoventilation: Secondary | ICD-10-CM | POA: Diagnosis present

## 2020-05-12 DIAGNOSIS — S0990XA Unspecified injury of head, initial encounter: Secondary | ICD-10-CM | POA: Diagnosis not present

## 2020-05-12 DIAGNOSIS — Z283 Underimmunization status: Secondary | ICD-10-CM

## 2020-05-12 DIAGNOSIS — I252 Old myocardial infarction: Secondary | ICD-10-CM

## 2020-05-12 DIAGNOSIS — Z882 Allergy status to sulfonamides status: Secondary | ICD-10-CM

## 2020-05-12 DIAGNOSIS — Z8249 Family history of ischemic heart disease and other diseases of the circulatory system: Secondary | ICD-10-CM

## 2020-05-12 DIAGNOSIS — R0689 Other abnormalities of breathing: Secondary | ICD-10-CM | POA: Diagnosis not present

## 2020-05-12 DIAGNOSIS — G2581 Restless legs syndrome: Secondary | ICD-10-CM | POA: Diagnosis present

## 2020-05-12 LAB — CBC WITH DIFFERENTIAL/PLATELET
Abs Immature Granulocytes: 0.07 10*3/uL (ref 0.00–0.07)
Basophils Absolute: 0 10*3/uL (ref 0.0–0.1)
Basophils Relative: 0 %
Eosinophils Absolute: 0 10*3/uL (ref 0.0–0.5)
Eosinophils Relative: 0 %
HCT: 37.5 % (ref 36.0–46.0)
Hemoglobin: 12.2 g/dL (ref 12.0–15.0)
Immature Granulocytes: 1 %
Lymphocytes Relative: 6 %
Lymphs Abs: 0.5 10*3/uL — ABNORMAL LOW (ref 0.7–4.0)
MCH: 27 pg (ref 26.0–34.0)
MCHC: 32.5 g/dL (ref 30.0–36.0)
MCV: 83 fL (ref 80.0–100.0)
Monocytes Absolute: 1 10*3/uL (ref 0.1–1.0)
Monocytes Relative: 12 %
Neutro Abs: 6.7 10*3/uL (ref 1.7–7.7)
Neutrophils Relative %: 81 %
Platelets: 235 10*3/uL (ref 150–400)
RBC: 4.52 MIL/uL (ref 3.87–5.11)
RDW: 15.9 % — ABNORMAL HIGH (ref 11.5–15.5)
WBC: 8.4 10*3/uL (ref 4.0–10.5)
nRBC: 0 % (ref 0.0–0.2)

## 2020-05-12 LAB — COMPREHENSIVE METABOLIC PANEL
ALT: 37 U/L (ref 0–44)
AST: 43 U/L — ABNORMAL HIGH (ref 15–41)
Albumin: 3.2 g/dL — ABNORMAL LOW (ref 3.5–5.0)
Alkaline Phosphatase: 73 U/L (ref 38–126)
Anion gap: 12 (ref 5–15)
BUN: 6 mg/dL — ABNORMAL LOW (ref 8–23)
CO2: 23 mmol/L (ref 22–32)
Calcium: 8.8 mg/dL — ABNORMAL LOW (ref 8.9–10.3)
Chloride: 86 mmol/L — ABNORMAL LOW (ref 98–111)
Creatinine, Ser: 0.61 mg/dL (ref 0.44–1.00)
GFR calc Af Amer: >60 mL/min (ref >60)
GFR calc non Af Amer: >60 mL/min (ref >60)
Glucose, Bld: 166 mg/dL — ABNORMAL HIGH (ref 70–99)
Potassium: 4.6 mmol/L (ref 3.5–5.1)
Sodium: 121 mmol/L — ABNORMAL LOW (ref 135–145)
Total Bilirubin: 1.1 mg/dL (ref 0.3–1.2)
Total Protein: 6.7 g/dL (ref 6.5–8.1)

## 2020-05-12 LAB — PROCALCITONIN: Procalcitonin: <0.1 ng/mL

## 2020-05-12 LAB — D-DIMER, QUANTITATIVE: D-Dimer, Quant: 1.15 ug/mL-FEU — ABNORMAL HIGH (ref 0.00–0.50)

## 2020-05-12 LAB — FIBRINOGEN: Fibrinogen: >800 mg/dL — ABNORMAL HIGH (ref 210–475)

## 2020-05-12 LAB — TRIGLYCERIDES: Triglycerides: 35 mg/dL (ref <150)

## 2020-05-12 LAB — C-REACTIVE PROTEIN: CRP: 24.6 mg/dL — ABNORMAL HIGH (ref <1.0)

## 2020-05-12 LAB — FERRITIN: Ferritin: 402 ng/mL — ABNORMAL HIGH (ref 11–307)

## 2020-05-12 LAB — LACTATE DEHYDROGENASE: LDH: 447 U/L — ABNORMAL HIGH (ref 98–192)

## 2020-05-12 LAB — LACTIC ACID, PLASMA: Lactic Acid, Venous: 1.2 mmol/L (ref 0.5–1.9)

## 2020-05-12 NOTE — ED Provider Notes (Signed)
[facility] EMERGENCY DEPARTMENT Provider Note   CSN: [id number] Arrival date & time: 05/12/20  2038     History Chief Complaint  Patient presents with  . Shortness of Breath    Amy Barnes is a 69 y.o. female.  69 year old female with extensive past medical history below including CAD, type 2 diabetes mellitus, hypertension, hyperlipidemia, OSA, morbid obesity who presents with shortness of breath and hypoxia.  Patient was admitted for COVID-19 infection 8/5-8/10 and when she was discharged home, she was on room air with normal O2 saturations in the low to mid 90s.  She reports that since she has gotten home, she has become progressively more short of breath and has been having saturations in the 80s at home.  EMS was called and they noted she was in the 80s, placed her on nonrebreather.  She denies any chest pain, fevers, or leg pain.  The history is provided by the patient.  Shortness of Breath      Past Medical History:  Diagnosis Date  . Allergic rhinitis   . Ambulates with cane    straight  . Anemia   . Anxiety   . Asthma    seasonal  . Bipolar affective disorder (HCC)    takes Synthroid meds for Bipolar  . CAD (coronary artery disease) 07/2016   by CT scan  . Carpal tunnel syndrome    had surgery but occasional still has some issues per patient  . Cataract   . Centrilobular emphysema (HCC) 07/2016   by CT scan - pt not aware of this  . Constipation due to pain medication   . Depression with anxiety   . Diabetes mellitus    type 2 - no meds diet controlled  . GERD (gastroesophageal reflux disease)   . History of blood transfusion   . 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 Hyperlipidemia   . Hypertension   . Hypothyroidism   . OSA (obstructive sleep apnea)    no longer using cpap, uses a bed that raises and lowers hob  . Osteoarthritis   . Osteopenia 08/11/2019   DEXA 07/2019 - T -1.1 R femur  (osteopenia)  . Pneumonia   . Restless legs   . Septic arthritis (HCC) 10/11/2012  . Shingles 06/30/2016  . Status post revision of total hip replacement bilateral   prosthetic infection R 2013, L 2015  . Thoracic aortic atherosclerosis (HCC) 11/207   by CT    Patient Active Problem List   Diagnosis Date Noted  . COVID-19 05/12/2020  . Hypoxia 05/04/2020  . Acute respiratory failure with hypoxia (HCC) 05/04/2020  . OSA (obstructive sleep apnea) 05/04/2020  . Obesity hypoventilation syndrome (HCC) 05/04/2020  . LVH (left ventricular hypertrophy) 05/04/2020  . HLD (hyperlipidemia) 05/04/2020  . Pneumonia due to COVID-19 virus 05/03/2020  . Non-ischemic cardiomyopathy (HCC) 12/29/2019  . Acute HFrEF (heart failure with reduced ejection fraction) (HCC) 11/25/2019  . UTI (urinary tract infection) 11/25/2019  . NSTEMI (non-ST elevated myocardial infarction) (HCC) 11/09/2019  . QT prolongation 11/09/2019  . Acute kidney injury (HCC) 11/04/2019  . Hyponatremia 11/03/2019  . Epigastric abdominal pain 11/03/2019  . Nausea and vomiting 11/03/2019  . Diminished pulses in lower extremity 08/11/2019  . Osteopenia 08/11/2019  . Wound infection complicating hardware (HCC) 04/01/2019  . 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 Bilateral lower extremity edema 11/29/2018  . Neurogenic urinary incontinence 11/29/2018  .  Urinary incontinence 11/29/2018  . Bladder spasms 11/29/2018  . Discogenic low back pain 11/29/2018  . Chronic sacroiliac joint pain (Bilateral) (R>L) 11/29/2018  . Spondylosis without myelopathy or radiculopathy, lumbar region 11/09/2018  . Chronic low back pain (Bilateral) (L>R) w/o sciatica 11/09/2018  . History of hip replacement (Left) 10/13/2018  . History of back surgery 10/13/2018  . Vitamin D insufficiency 10/13/2018  . Osteoarthritis of facet joint of lumbar spine 10/13/2018  . Osteoarthritis involving multiple joints 10/13/2018  .  Cervical radiculopathy (Left) 10/13/2018  . Lumbar facet arthropathy (Bilateral) 10/13/2018  . Lumbar facet syndrome (Bilateral) (L>R) 10/13/2018  . Abnormal MRI, lumbar spine (09/16/2018) 10/13/2018  . Lumbar nerve root compression (L4) (Left) 10/13/2018  . Lumbar foraminal stenosis 10/13/2018  . 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 Chronic low back pain (Primary Area of Pain) (Bilateral) (L>R) w/ sciatica (Left) 09/14/2018  . Chronic lower extremity pain (Secondary Area of Pain) (Left) 09/14/2018  . Chronic hip pain [facility] Area of Pain) (Bilateral) (L>R) 09/14/2018  . Chronic knee pain (Fourth Area of Pain) (Bilateral) (R>L) 09/14/2018  . Pharmacologic therapy 09/14/2018  . Disorder of skeletal system 09/14/2018  . Problems influencing health status 09/14/2018  . Primary osteoarthritis of first carpometacarpal joint of left hand 04/20/2018  . 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 01/16/2018  . PAH (pulmonary artery hypertension) (HCC) 08/03/2017  . Advanced care planning/counseling discussion 06/25/2017  . Synovial cyst of lumbar facet joint 11/13/2016  . Thoracic aortic atherosclerosis (HCC)   . CAD (coronary artery disease) 07/30/2016  . Centrilobular emphysema (HCC) 07/30/2016  . Ex-smoker 07/08/2016  . Nonspecific abnormal electrocardiogram (ECG) (EKG) 07/08/2016  . Chronic lumbar radicular pain (L4) (Bilateral) 06/09/2016  . DOE (dyspnea on exertion) 05/22/2016  . Degenerative joint disease (DJD) of hip 04/17/2016  . Somatic dysfunction of sacroiliac joint 04/17/2016  . S/P Lumbar Fusion (L3-4 PLIF) 04/17/2016  . Cervical fusion syndrome 04/17/2016  . Pedal edema 02/19/2016  . Chronic pain syndrome 02/19/2016  . Abdominal aortic atherosclerosis (HCC) 12/11/2015  . MRSA (methicillin resistant Staphylococcus aureus) infection 10/11/2012  . Urinary urgency 07/28/2012  . Infection or inflammatory  reaction due to internal joint prosthesis (HCC) 12/03/2011  . Morbid obesity with BMI of 60.0-69.9, adult (HCC) 05/13/2011  . Essential hypertension 05/17/2010  . Cervical radiculopathy (Right) 12/25/2009  . Benign positional vertigo 07/21/2008  . Hyperlipidemia associated with type 2 diabetes mellitus (HCC) 08/16/2007  . Hypothyroidism 07/30/2007  . DM type 2 with diabetic peripheral neuropathy (HCC) 07/30/2007  . Normocytic anemia 07/30/2007  . Bipolar disorder (HCC) 07/30/2007  . Allergic rhinitis 07/30/2007  . Asthma 07/30/2007  . GERD 07/30/2007  . Osteoarthritis 07/30/2007  . OSA on CPAP 07/30/2007    Past Surgical History:  Procedure Laterality Date  . BREAST CYST ASPIRATION    . CARPAL TUNNEL RELEASE Right 05/15/2011  . CARPAL TUNNEL RELEASE Left 12/24/2017   Procedure: LEFT CARPAL TUNNEL RELEASE;  Surgeon: [name], MD;  Location:  SURGERY CENTER;  Service: Orthopedics;  Laterality: Left;  . CERVICAL FUSION  2012   C2/3/4  . COLONOSCOPY WITH PROPOFOL N/A 12/31/2015   diverticulosis, int hem, o/w normal rpt 10 yrs (Rein)  . ESOPHAGOGASTRODUODENOSCOPY (EGD) WITH PROPOFOL N/A 11/12/2019   Procedure: ESOPHAGOGASTRODUODENOSCOPY (EGD) WITH PROPOFOL;  Surgeon: Sherrilyn Rist, MD;  Location: [facility] ENDOSCOPY;  Service: Gastroenterology;  Laterality: N/A;  . EYE SURGERY Bilateral    cataract surgery with  lens implant  . FOOT SURGERY  1982   bone spur  . I & D EXTREMITY  09/06/2012   Budd Palmer, MD; Right;  I&D of right thigh  . I & D EXTREMITY Left 07/2013   wound vac - daily doxycycline indefinitely  . KNEE ARTHROSCOPY  09/01/2012   Dannielle Huh, MD;  Right  . LEFT HEART CATH AND CORONARY ANGIOGRAPHY N/A 11/10/2019   Procedure: LEFT HEART CATH AND CORONARY ANGIOGRAPHY;  Surgeon: Lennette Bihari, MD;  Location: [facility] INVASIVE CV LAB;  Service: Cardiovascular;  Laterality: N/A;  . LUMBAR FUSION  01/08/2012   L3-4  . LUMBAR FUSION  02/2019   unexpectedly discovered  MRSA infection - pus Lovell Sheehan)   . LUMBAR LAMINECTOMY/DECOMPRESSION MICRODISCECTOMY N/A 11/13/2016   LAMINOTOMY/LAMINECTOMY LUMBAR FOUR LUMBAR FIVE  WITH RESECTION OF SYNOVIAL CYST;  Surgeon: Tressie Stalker, MD  . NECK SURGERY     Herniated disk C2,3,4  . NOSE SURGERY    . PARTIAL HYSTERECTOMY  1984   for mennorhagia, ovaries remain  . REVISION TOTAL HIP ARTHROPLASTY Left 08/28/2011  . TMJ ARTHROPLASTY  1982  . TOTAL HIP ARTHROPLASTY Left 2002  . TRIGGER FINGER RELEASE Right 05/15/2011   long finger     OB History   No obstetric history on file.     Family History  Problem Relation Age of Onset  . Aneurysm Father 62       brain  . Alcohol abuse Father   . Cancer Father        possibly  . CAD Other        several siblings  . Cancer Brother        prostate  . Diabetes Brother   . Diabetes Sister   . Anesthesia problems Neg Hx   . Hypotension Neg Hx   . Malignant hyperthermia Neg Hx   . Pseudochol deficiency Neg Hx   . Breast cancer Neg Hx     Social History   Tobacco Use  . Smoking status: Former Smoker    Packs/day: 1.50    Years: 40.00    Pack years: 60.00    Types: Cigarettes    Quit date: 04/29/2009    Years since quitting: 11.0  . Smokeless tobacco: Never Used  Vaping Use  . Vaping Use: Never used  Substance Use Topics  . Alcohol use: Yes    Alcohol/week: 2.0 standard drinks    Types: 2 Shots of liquor per week    Comment: 2 shots/weekly  . Drug use: No    Home Medications Prior to Admission medications   Medication Sig Start Date End Date Taking? Authorizing Provider  albuterol (PROVENTIL HFA;VENTOLIN HFA) 108 (90 Base) MCG/ACT inhaler Inhale 2 puffs into the lungs every 6 (six) hours as needed for wheezing or shortness of breath. 12/14/18   [name], MD  ascorbic acid (VITAMIN C) 500 MG tablet Take 1 tablet (500 mg total) by mouth daily. 05/09/20   Drema Dallas, MD  aspirin 81 MG EC tablet Take 81 mg by mouth at bedtime.     [provider]  budesonide-formoterol (SYMBICORT) 160-4.5 MCG/ACT inhaler Inhale 2 puffs into the lungs 2 (two) times daily. 11/07/19   [name], MD  carvedilol (COREG) 6.25 MG tablet Take 1 tablet (6.25 mg total) by mouth 2 (two) times daily. 12/16/19 05/04/20  [name], MD  celecoxib (CELEBREX) 200 MG capsule Take 1 capsule (200 mg total) by mouth daily. 01/13/20   [name],  MD  dicyclomine (BENTYL) 20 MG tablet Take 1 tablet (20 mg total) by mouth in the morning and at bedtime. 11/21/19   Zehr, Princella Pellegrini, PA-C  doxycycline (VIBRA-TABS) 100 MG tablet Take 1 tablet (100 mg total) by mouth 2 (two) times daily. 12/21/19   Judyann Munson, MD  fluconazole (DIFLUCAN) 200 MG tablet Take 1 tablet (200 mg total) by mouth daily. X 5 days as needed for yeast infection 12/21/19   Judyann Munson, MD  FLUoxetine (PROZAC) 40 MG capsule Take by mouth daily. 03/08/19   [provider]  furosemide (LASIX) 20 MG tablet Take 1 tablet (20 mg total) by mouth daily as needed for edema. 02/23/20 05/04/20  Rollene Rotunda, MD  hydrALAZINE (APRESOLINE) 10 MG tablet Take 1 tablet (10 mg total) by mouth every 6 (six) hours. 05/08/20   Drema Dallas, MD  lamoTRIgine (LAMICTAL) 200 MG tablet Take 200 mg by mouth daily.      [provider]  levothyroxine (SYNTHROID) 150 MCG tablet TAKE ONE TABLET BY MOUTH AT BEDTIME 03/19/20   [name], MD  losartan (COZAAR) 50 MG tablet Take 1 tablet (50 mg total) by mouth daily. 12/29/19 05/04/20  Abelino Derrick, PA-C  metFORMIN (GLUCOPHAGE) 500 MG tablet Take 1 tablet (500 mg total) by mouth every evening. 01/27/20   [name], MD  montelukast (SINGULAIR) 10 MG tablet Take 1 tablet (10 mg total) by mouth at bedtime. 11/25/19   [name], MD  Multiple Vitamin (MULTIVITAMIN WITH MINERALS) TABS tablet Take 2 tablets by mouth daily.     [provider]  ondansetron (ZOFRAN ODT) 8 MG disintegrating tablet Take 1 tablet (8 mg total) by  mouth 2 (two) times daily as needed for nausea or vomiting. 11/14/19   Hughie Closs, MD  Oxcarbazepine (TRILEPTAL) 300 MG tablet Take 300-600 mg by mouth See admin instructions. Take 300 mg by mouth in the morning and 600 mg at night    [provider]  oxybutynin (DITROPAN-XL) 10 MG 24 hr tablet Take 1 tablet (10 mg total) by mouth daily. 11/03/19   Jerilee Field, MD  pantoprazole (PROTONIX) 40 MG tablet Take 1 tablet (40 mg total) by mouth 2 (two) times daily. Patient taking differently: Take 40 mg by mouth daily.  11/25/19   [name], MD  polyethylene glycol (MIRALAX / GLYCOLAX) 17 g packet Take 17 g by mouth 2 (two) times daily as needed for mild constipation. 11/14/19   Hughie Closs, MD  prochlorperazine (COMPAZINE) 10 MG tablet Take 1 tablet (10 mg total) by mouth every 6 (six) hours as needed for nausea or vomiting. 11/21/19   Zehr, Princella Pellegrini, PA-C  simvastatin (ZOCOR) 40 MG tablet Take 1 tablet (40 mg total) by mouth every evening. 01/09/20   [name], MD  zaleplon (SONATA) 10 MG capsule Take 10 mg by mouth at bedtime as needed for sleep.    [provider]  zinc sulfate 220 (50 Zn) MG capsule Take 1 capsule (220 mg total) by mouth daily. 05/09/20   Drema Dallas, MD    Allergies    Gala Murdoch [fesoterodine], Cephalexin, Hydrocodone, Entresto [sacubitril-valsartan], Tolterodine tartrate, Risperidone and related, Seroquel [quetiapine fumarate], and Sulfa antibiotics  Review of Systems   Review of Systems  Respiratory: Positive for shortness of breath.    All other systems reviewed and are negative except that which was mentioned in HPI  Physical Exam Updated Vital Signs BP (!) 152/72   Pulse 89   Temp 97.7 F (  36.5 C) (Oral)   Resp (!) 26   Ht  (1.626 m)   Wt (!) 169.8 kg   LMP  (LMP Unknown)   SpO2 97%   BMI 64.26 kg/m   Physical Exam Vitals and nursing note reviewed.  Constitutional:      Appearance: She is well-developed. She is  obese. She is ill-appearing. She is not toxic-appearing.  HENT:     Head: Normocephalic and atraumatic.  Eyes:     Conjunctiva/sclera: Conjunctivae normal.  Cardiovascular:     Rate and Rhythm: Normal rate and regular rhythm.     Heart sounds: Normal heart sounds. No murmur heard.   Pulmonary:     Effort: Tachypnea present.     Comments: Increased WOB with mild respiratory distress, breath sounds difficult to hear 2/2 body habitus Abdominal:     General: Bowel sounds are normal. There is no distension.     Palpations: Abdomen is soft.     Tenderness: There is no abdominal tenderness.  Musculoskeletal:     Cervical back: Neck supple.     Right lower leg: No tenderness.     Left lower leg: No tenderness.  Skin:    General: Skin is warm and dry.  Neurological:     Mental Status: She is alert and oriented to person, place, and time.     Comments: Fluent speech  Psychiatric:        Mood and Affect: Mood is anxious.        Judgment: Judgment normal.     ED Results / Procedures / Treatments   Labs (all labs ordered are listed, but only abnormal results are displayed) Labs Reviewed  CBC WITH DIFFERENTIAL/PLATELET - Abnormal; Notable for the following components:      Result Value   RDW 15.9 (*)    Lymphs Abs 0.5 (*)    All other components within normal limits  COMPREHENSIVE METABOLIC PANEL - Abnormal; Notable for the following components:   Sodium 121 (*)    Chloride 86 (*)    Glucose, Bld 166 (*)    BUN 6 (*)    Calcium 8.8 (*)    Albumin 3.2 (*)    AST 43 (*)    All other components within normal limits  D-DIMER, QUANTITATIVE (NOT AT [facility]) - Abnormal; Notable for the following components:   D-Dimer, Quant 1.15 (*)    All other components within normal limits  LACTATE DEHYDROGENASE - Abnormal; Notable for the following components:   LDH 447 (*)    All other components within normal limits  FERRITIN - Abnormal; Notable for the following components:   Ferritin 402 (*)     All other components within normal limits  FIBRINOGEN - Abnormal; Notable for the following components:   Fibrinogen >800 (*)    All other components within normal limits  C-REACTIVE PROTEIN - Abnormal; Notable for the following components:   CRP 24.6 (*)    All other components within normal limits  CULTURE, BLOOD (ROUTINE X 2)  CULTURE, BLOOD (ROUTINE X 2)  LACTIC ACID, PLASMA  TRIGLYCERIDES  PROCALCITONIN    EKG EKG Interpretation  Date/Time:  Saturday May 12 2020 20:48:35 EDT Ventricular Rate:  85 PR Interval:    QRS Duration: 136 QT Interval:  410 QTC Calculation: 488 R Axis:   -55 Text Interpretation: Sinus rhythm Probable left atrial enlargement Left bundle branch block rate faster but similar to previous Confirmed by Frederick Peers [phone]) on 05/12/2020 10:32:06 PM  Radiology DG Chest Port 1 View  Result Date: 05/12/2020 CLINICAL DATA:  COVID positive shortness of breath EXAM: PORTABLE CHEST 1 VIEW COMPARISON:  May 03, 2020 FINDINGS: The heart size and mediastinal contours are unchanged with mild cardiomegaly. Aortic knob calcifications. Interval significant worsening in the multifocal patchy hazy airspace opacities throughout both lungs, right worse than left. No pleural effusion. No acute osseous abnormality. Cervical fixation hardware seen. IMPRESSION: Interval worsening in the multifocal airspace opacities consistent with pneumonia. Electronically Signed   By: Jonna Clark M.D.   On: 05/12/2020 22:26    Procedures Procedures (including critical care time) CRITICAL CARE Performed by: Ambrose Finland Emmalee Solivan   Total critical care time: 30 minutes  Critical care time was exclusive of separately billable procedures and treating other patients.  Critical care was necessary to treat or prevent imminent or life-threatening deterioration.  Critical care was time spent personally by me on the following activities: development of treatment plan with patient and/or  surrogate as well as nursing, discussions with consultants, evaluation of patient's response to treatment, examination of patient, obtaining history from patient or surrogate, ordering and performing treatments and interventions, ordering and review of laboratory studies, ordering and review of radiographic studies, pulse oximetry and re-evaluation of patient's condition. Medications Ordered in ED Medications - No data to display  ED Course  I have reviewed the triage vital signs and the nursing notes.  Pertinent labs & imaging results that were available during my care of the patient were reviewed by me and considered in my medical decision making (see chart for details).    MDM Rules/Calculators/A&P                          Patient was in mild respiratory distress on my exam but I was able to switch her from nonrebreather to 5L [place] while maintaining saturations.  Remainder of vital signs stable.  Lab work notable for hyponatremia with sodium 121, normal creatinine, normal CBC, normal lactate, significantly elevated inflammatory markers.  Chest x-ray shows worsening multifocal opacities suggestive of COVID-19.  I have ordered CTA to rule out PE although I suspect she may just be having worsening of COVID-19 course, complicated by her morbid obesity and underlying OSA.  Discussed with [facility], Dr. Leafy Half, who will admit for further care.   Amy Barnes was evaluated in Emergency Department on 05/12/2020 for the symptoms described in the history of present illness. She was evaluated in the context of the global COVID-19 pandemic, which necessitated consideration that the patient might be at risk for infection with the SARS-CoV-2 virus that causes COVID-19. Institutional protocols and algorithms that pertain to the evaluation of patients at risk for COVID-19 are in a state of rapid change based on information released by regulatory bodies including the CDC and federal and state organizations. These  policies and algorithms were followed during the patient's care in the ED.  Final Clinical Impression(s) / ED Diagnoses Final diagnoses:  Acute hypoxemic respiratory failure due to COVID-19 [facility])    Rx / DC Orders ED Discharge Orders    None       Dywane Peruski, Ambrose Finland, MD 05/12/20 [id number]

## 2020-05-12 NOTE — ED Triage Notes (Signed)
Pt BIB [facility] from home. D/C from here 8/10 for covid pnx admission. Pt sats reading 80s on EMS arrival on RA. NRB placed on pt, sats up to 97% at 15L/min.

## 2020-05-13 ENCOUNTER — Inpatient Hospital Stay (HOSPITAL_COMMUNITY): Payer: Medicare Other

## 2020-05-13 DIAGNOSIS — U071 COVID-19: Secondary | ICD-10-CM | POA: Diagnosis not present

## 2020-05-13 DIAGNOSIS — J9601 Acute respiratory failure with hypoxia: Secondary | ICD-10-CM | POA: Diagnosis not present

## 2020-05-13 DIAGNOSIS — E1142 Type 2 diabetes mellitus with diabetic polyneuropathy: Secondary | ICD-10-CM | POA: Diagnosis not present

## 2020-05-13 DIAGNOSIS — E039 Hypothyroidism, unspecified: Secondary | ICD-10-CM | POA: Diagnosis not present

## 2020-05-13 LAB — CBG MONITORING, ED
Glucose-Capillary: 164 mg/dL — ABNORMAL HIGH (ref 70–99)
Glucose-Capillary: 201 mg/dL — ABNORMAL HIGH (ref 70–99)
Glucose-Capillary: 179 mg/dL — ABNORMAL HIGH (ref 70–99)
Glucose-Capillary: 192 mg/dL — ABNORMAL HIGH (ref 70–99)
Glucose-Capillary: 164 mg/dL — ABNORMAL HIGH (ref 70–99)

## 2020-05-13 LAB — BASIC METABOLIC PANEL
Anion gap: 9 (ref 5–15)
BUN: 8 mg/dL (ref 8–23)
CO2: 25 mmol/L (ref 22–32)
Calcium: 8.3 mg/dL — ABNORMAL LOW (ref 8.9–10.3)
Chloride: 89 mmol/L — ABNORMAL LOW (ref 98–111)
Creatinine, Ser: 0.52 mg/dL (ref 0.44–1.00)
GFR calc Af Amer: >60 mL/min (ref >60)
GFR calc non Af Amer: >60 mL/min (ref >60)
Glucose, Bld: 189 mg/dL — ABNORMAL HIGH (ref 70–99)
Potassium: 4.6 mmol/L (ref 3.5–5.1)
Sodium: 123 mmol/L — ABNORMAL LOW (ref 135–145)

## 2020-05-13 LAB — TSH: TSH: 0.873 u[IU]/mL (ref 0.350–4.500)

## 2020-05-13 LAB — NA AND K (SODIUM & POTASSIUM), RAND UR
Potassium Urine: 49 mmol/L
Sodium, Ur: 63 mmol/L

## 2020-05-13 LAB — OSMOLALITY: Osmolality: 254 mOsm/kg — ABNORMAL LOW (ref 275–295)

## 2020-05-13 LAB — OSMOLALITY, URINE: Osmolality, Ur: 685 mOsm/kg (ref 300–900)

## 2020-05-13 MED ORDER — PROCHLORPERAZINE MALEATE 10 MG PO TABS
10.0000 mg | ORAL_TABLET | Freq: Four times a day (QID) | ORAL | Status: DC | PRN
  Administered 2020-05-16 – 2020-05-21 (×3): 10 mg via ORAL
  Filled 2020-05-13 (×4): qty 1

## 2020-05-13 MED ORDER — MAGNESIUM HYDROXIDE 400 MG/5ML PO SUSP
30.0000 mL | Freq: Every day | ORAL | Status: DC | PRN
  Filled 2020-05-13: qty 30

## 2020-05-13 MED ORDER — ALBUTEROL SULFATE HFA 108 (90 BASE) MCG/ACT IN AERS
2.0000 | INHALATION_SPRAY | Freq: Four times a day (QID) | RESPIRATORY_TRACT | Status: DC | PRN
  Administered 2020-05-14 – 2020-05-21 (×5): 2 via RESPIRATORY_TRACT
  Filled 2020-05-13: qty 6.7

## 2020-05-13 MED ORDER — ENSURE ENLIVE PO LIQD
237.0000 mL | Freq: Two times a day (BID) | ORAL | Status: DC
  Administered 2020-05-13 – 2020-05-22 (×19): 237 mL via ORAL
  Filled 2020-05-13: qty 237

## 2020-05-13 MED ORDER — DOXYCYCLINE HYCLATE 100 MG PO TABS
100.0000 mg | ORAL_TABLET | Freq: Two times a day (BID) | ORAL | Status: DC
  Administered 2020-05-13 – 2020-05-22 (×20): 100 mg via ORAL
  Filled 2020-05-13 (×21): qty 1

## 2020-05-13 MED ORDER — ENOXAPARIN SODIUM 80 MG/0.8ML ~~LOC~~ SOLN: 80.0000 mg | Freq: Every day | SUBCUTANEOUS | Status: DC

## 2020-05-13 MED ORDER — GUAIFENESIN ER 600 MG PO TB12
600.0000 mg | ORAL_TABLET | Freq: Two times a day (BID) | ORAL | Status: DC
  Administered 2020-05-13 – 2020-05-22 (×20): 600 mg via ORAL
  Filled 2020-05-13 (×20): qty 1

## 2020-05-13 MED ORDER — HYDRALAZINE HCL 10 MG PO TABS
10.0000 mg | ORAL_TABLET | Freq: Four times a day (QID) | ORAL | Status: DC
  Administered 2020-05-13 – 2020-05-14 (×5): 10 mg via ORAL
  Filled 2020-05-13 (×6): qty 1

## 2020-05-13 MED ORDER — SODIUM CHLORIDE 0.9 % IV SOLN
100.0000 mg | Freq: Every day | INTRAVENOUS | Status: AC
  Administered 2020-05-14 – 2020-05-17 (×4): 100 mg via INTRAVENOUS
  Filled 2020-05-13 (×4): qty 20

## 2020-05-13 MED ORDER — BARICITINIB 2 MG PO TABS
4.0000 mg | ORAL_TABLET | Freq: Every day | ORAL | Status: DC
  Administered 2020-05-13 – 2020-05-22 (×10): 4 mg via ORAL
  Filled 2020-05-13 (×10): qty 2

## 2020-05-13 MED ORDER — SODIUM CHLORIDE 0.9 % IV SOLN: INTRAVENOUS | Status: DC

## 2020-05-13 MED ORDER — DICYCLOMINE HCL 20 MG PO TABS
20.0000 mg | ORAL_TABLET | Freq: Two times a day (BID) | ORAL | Status: DC
  Administered 2020-05-13 – 2020-05-22 (×19): 20 mg via ORAL
  Filled 2020-05-13 (×21): qty 1

## 2020-05-13 MED ORDER — FUROSEMIDE 20 MG PO TABS: 20.0000 mg | ORAL_TABLET | Freq: Every day | ORAL | Status: DC | PRN

## 2020-05-13 MED ORDER — LEVOTHYROXINE SODIUM 75 MCG PO TABS
150.0000 ug | ORAL_TABLET | Freq: Every day | ORAL | Status: DC
  Administered 2020-05-13 – 2020-05-21 (×10): 150 ug via ORAL
  Filled 2020-05-13 (×10): qty 2

## 2020-05-13 MED ORDER — ZOLPIDEM TARTRATE 5 MG PO TABS: 5.0000 mg | ORAL_TABLET | Freq: Every evening | ORAL | Status: DC | PRN

## 2020-05-13 MED ORDER — ENOXAPARIN SODIUM 100 MG/ML ~~LOC~~ SOLN
85.0000 mg | SUBCUTANEOUS | Status: DC
  Administered 2020-05-13 – 2020-05-21 (×10): 85 mg via SUBCUTANEOUS
  Filled 2020-05-13 (×8): qty 1
  Filled 2020-05-13: qty 0.85
  Filled 2020-05-13 (×2): qty 1

## 2020-05-13 MED ORDER — GUAIFENESIN-DM 100-10 MG/5ML PO SYRP
10.0000 mL | ORAL_SOLUTION | ORAL | Status: DC | PRN
  Administered 2020-05-17 – 2020-05-21 (×12): 10 mL via ORAL
  Filled 2020-05-13 (×12): qty 10

## 2020-05-13 MED ORDER — DEXAMETHASONE SODIUM PHOSPHATE 10 MG/ML IJ SOLN
6.0000 mg | Freq: Every day | INTRAMUSCULAR | Status: DC
  Administered 2020-05-13 – 2020-05-17 (×5): 6 mg via INTRAVENOUS
  Filled 2020-05-13 (×6): qty 1

## 2020-05-13 MED ORDER — ASCORBIC ACID 500 MG PO TABS
500.0000 mg | ORAL_TABLET | Freq: Every day | ORAL | Status: DC
  Administered 2020-05-14 – 2020-05-22 (×9): 500 mg via ORAL
  Filled 2020-05-13 (×10): qty 1

## 2020-05-13 MED ORDER — OXCARBAZEPINE 300 MG PO TABS
600.0000 mg | ORAL_TABLET | Freq: Every day | ORAL | Status: DC
  Administered 2020-05-13 – 2020-05-19 (×8): 600 mg via ORAL
  Filled 2020-05-13 (×7): qty 2

## 2020-05-13 MED ORDER — ASCORBIC ACID 500 MG PO TABS: 500.0000 mg | ORAL_TABLET | Freq: Every day | ORAL | Status: DC

## 2020-05-13 MED ORDER — PANTOPRAZOLE SODIUM 40 MG PO TBEC: 40.0000 mg | DELAYED_RELEASE_TABLET | Freq: Every day | ORAL | Status: DC

## 2020-05-13 MED ORDER — VITAMIN D 25 MCG (1000 UNIT) PO TABS
1000.0000 [IU] | ORAL_TABLET | Freq: Every day | ORAL | Status: DC
  Administered 2020-05-13 – 2020-05-22 (×10): 1000 [IU] via ORAL
  Filled 2020-05-13 (×11): qty 1

## 2020-05-13 MED ORDER — INSULIN ASPART 100 UNIT/ML ~~LOC~~ SOLN
0.0000 [IU] | Freq: Three times a day (TID) | SUBCUTANEOUS | Status: DC
  Administered 2020-05-13: 2 [IU] via SUBCUTANEOUS
  Administered 2020-05-13: 3 [IU] via SUBCUTANEOUS
  Administered 2020-05-13 (×2): 2 [IU] via SUBCUTANEOUS
  Administered 2020-05-14 (×2): 3 [IU] via SUBCUTANEOUS
  Administered 2020-05-14: 2 [IU] via SUBCUTANEOUS
  Administered 2020-05-14: 1 [IU] via SUBCUTANEOUS
  Administered 2020-05-15 – 2020-05-16 (×4): 2 [IU] via SUBCUTANEOUS
  Administered 2020-05-17: 3 [IU] via SUBCUTANEOUS
  Administered 2020-05-17: 2 [IU] via SUBCUTANEOUS
  Administered 2020-05-17: 5 [IU] via SUBCUTANEOUS
  Administered 2020-05-17 – 2020-05-18 (×3): 2 [IU] via SUBCUTANEOUS
  Administered 2020-05-18 (×2): 3 [IU] via SUBCUTANEOUS
  Administered 2020-05-19: 5 [IU] via SUBCUTANEOUS
  Administered 2020-05-19: 1 [IU] via SUBCUTANEOUS
  Administered 2020-05-19: 2 [IU] via SUBCUTANEOUS
  Administered 2020-05-19: 3 [IU] via SUBCUTANEOUS
  Administered 2020-05-20: 5 [IU] via SUBCUTANEOUS
  Administered 2020-05-20 (×2): 2 [IU] via SUBCUTANEOUS
  Administered 2020-05-21: 5 [IU] via SUBCUTANEOUS
  Administered 2020-05-21: 3 [IU] via SUBCUTANEOUS
  Administered 2020-05-21 (×2): 2 [IU] via SUBCUTANEOUS
  Administered 2020-05-22: 1 [IU] via SUBCUTANEOUS

## 2020-05-13 MED ORDER — SODIUM CHLORIDE 0.9 % IV SOLN
200.0000 mg | Freq: Once | INTRAVENOUS | Status: AC
  Administered 2020-05-13: 200 mg via INTRAVENOUS
  Filled 2020-05-13 (×2): qty 40

## 2020-05-13 MED ORDER — TRAZODONE HCL 50 MG PO TABS
25.0000 mg | ORAL_TABLET | Freq: Every evening | ORAL | Status: DC | PRN
  Administered 2020-05-17 – 2020-05-21 (×4): 25 mg via ORAL
  Filled 2020-05-13 (×4): qty 1

## 2020-05-13 MED ORDER — ASPIRIN EC 81 MG PO TBEC
81.0000 mg | DELAYED_RELEASE_TABLET | Freq: Every day | ORAL | Status: DC
  Administered 2020-05-13 – 2020-05-21 (×10): 81 mg via ORAL
  Filled 2020-05-13 (×10): qty 1

## 2020-05-13 MED ORDER — ZINC SULFATE 220 (50 ZN) MG PO CAPS
220.0000 mg | ORAL_CAPSULE | Freq: Every day | ORAL | Status: DC
  Administered 2020-05-13 – 2020-05-22 (×10): 220 mg via ORAL
  Filled 2020-05-13 (×10): qty 1

## 2020-05-13 MED ORDER — ZINC SULFATE 220 (50 ZN) MG PO CAPS: 220.0000 mg | ORAL_CAPSULE | Freq: Every day | ORAL | Status: DC

## 2020-05-13 MED ORDER — OXCARBAZEPINE 300 MG PO TABS: 300.0000 mg | ORAL_TABLET | ORAL | Status: DC

## 2020-05-13 MED ORDER — CARVEDILOL 6.25 MG PO TABS
6.2500 mg | ORAL_TABLET | Freq: Two times a day (BID) | ORAL | Status: DC
  Administered 2020-05-13 – 2020-05-22 (×19): 6.25 mg via ORAL
  Filled 2020-05-13 (×7): qty 1
  Filled 2020-05-13: qty 2
  Filled 2020-05-13 (×8): qty 1
  Filled 2020-05-13: qty 2
  Filled 2020-05-13 (×2): qty 1

## 2020-05-13 MED ORDER — LAMOTRIGINE 100 MG PO TABS
200.0000 mg | ORAL_TABLET | Freq: Every day | ORAL | Status: DC
  Administered 2020-05-13 – 2020-05-22 (×10): 200 mg via ORAL
  Filled 2020-05-13 (×11): qty 2

## 2020-05-13 MED ORDER — POLYETHYLENE GLYCOL 3350 17 G PO PACK
17.0000 g | PACK | Freq: Two times a day (BID) | ORAL | Status: DC | PRN
  Administered 2020-05-17: 17 g via ORAL
  Filled 2020-05-13 (×2): qty 1

## 2020-05-13 MED ORDER — LINAGLIPTIN 5 MG PO TABS
5.0000 mg | ORAL_TABLET | Freq: Every day | ORAL | Status: DC
  Administered 2020-05-13 – 2020-05-22 (×10): 5 mg via ORAL
  Filled 2020-05-13 (×10): qty 1

## 2020-05-13 MED ORDER — ADULT MULTIVITAMIN W/MINERALS CH
1.0000 | ORAL_TABLET | Freq: Every day | ORAL | Status: DC
  Administered 2020-05-13 – 2020-05-22 (×10): 1 via ORAL
  Filled 2020-05-13 (×10): qty 1

## 2020-05-13 MED ORDER — HEPARIN BOLUS VIA INFUSION
5000.0000 [IU] | Freq: Once | INTRAVENOUS | Status: AC
  Administered 2020-05-13: 5000 [IU] via INTRAVENOUS
  Filled 2020-05-13: qty 5000

## 2020-05-13 MED ORDER — ACETAMINOPHEN 325 MG PO TABS
650.0000 mg | ORAL_TABLET | Freq: Four times a day (QID) | ORAL | Status: DC | PRN
  Administered 2020-05-13 – 2020-05-21 (×16): 650 mg via ORAL
  Filled 2020-05-13 (×17): qty 2

## 2020-05-13 MED ORDER — MONTELUKAST SODIUM 10 MG PO TABS
10.0000 mg | ORAL_TABLET | Freq: Every day | ORAL | Status: DC
  Administered 2020-05-13 – 2020-05-21 (×10): 10 mg via ORAL
  Filled 2020-05-13 (×10): qty 1

## 2020-05-13 MED ORDER — HEPARIN (PORCINE) 25000 UT/250ML-% IV SOLN
1500.0000 [IU]/h | INTRAVENOUS | Status: DC
  Administered 2020-05-13: 1500 [IU]/h via INTRAVENOUS
  Filled 2020-05-13: qty 250

## 2020-05-13 MED ORDER — OXCARBAZEPINE 300 MG PO TABS
300.0000 mg | ORAL_TABLET | Freq: Every day | ORAL | Status: DC
  Administered 2020-05-13 – 2020-05-19 (×7): 300 mg via ORAL
  Filled 2020-05-13 (×9): qty 1

## 2020-05-13 MED ORDER — SIMVASTATIN 20 MG PO TABS
40.0000 mg | ORAL_TABLET | Freq: Every evening | ORAL | Status: DC
  Administered 2020-05-14 – 2020-05-21 (×8): 40 mg via ORAL
  Filled 2020-05-13 (×8): qty 2

## 2020-05-13 MED ORDER — LOSARTAN POTASSIUM 50 MG PO TABS
50.0000 mg | ORAL_TABLET | Freq: Every day | ORAL | Status: DC
  Administered 2020-05-13 – 2020-05-14 (×2): 50 mg via ORAL
  Filled 2020-05-13 (×2): qty 1

## 2020-05-13 MED ORDER — INSULIN DETEMIR 100 UNIT/ML ~~LOC~~ SOLN
0.0750 [IU]/kg | Freq: Two times a day (BID) | SUBCUTANEOUS | Status: DC
  Administered 2020-05-13 – 2020-05-15 (×6): 13 [IU] via SUBCUTANEOUS
  Filled 2020-05-13 (×8): qty 0.13

## 2020-05-13 MED ORDER — PANTOPRAZOLE SODIUM 40 MG PO TBEC
40.0000 mg | DELAYED_RELEASE_TABLET | Freq: Every day | ORAL | Status: DC
  Administered 2020-05-13 – 2020-05-22 (×10): 40 mg via ORAL
  Filled 2020-05-13 (×10): qty 1

## 2020-05-13 NOTE — ED Notes (Signed)
Call daughter Hala Narula at [phone] with an update. She states she called last night and has not received a call back.

## 2020-05-13 NOTE — ED Notes (Signed)
Dinner tray delivered.

## 2020-05-13 NOTE — ED Notes (Signed)
Lunch tray delivered.

## 2020-05-13 NOTE — Progress Notes (Signed)
PROGRESS NOTE                                                                                                                                                                                                             Patient Demographics:    Amy Barnes, is a 69 y.o. female, DOB - [birth year], OHF:[id number]  Admit date - 05/12/2020   Admitting Physician Hannah Beat, MD  Outpatient Primary MD for the patient is [name], MD  LOS - 1   Chief Complaint  Patient presents with  . Shortness of Breath       Brief Narrative    Amy Barnes  is a 69 y.o. Caucasian female with a known history of coronary artery disease, type 2 diabetes mellitus, hypertension, dyslipidemia, hypothyroidism and obstructive sleep apnea as well as recent COVID-19 for which she was admitted here and discharged 5 days ago on room air, who presents to the emergency room with acute onset of worsening dyspnea with hypoxemia.  Her pulse currently was 83% on room air.  She continues to have cough occasionally productive of clear sputum.  No fever or chills.  No nausea or vomiting or abdominal pain.  She continues to have no taste or smell.  No chest pain or palpitations.  No dysuria, oliguria or hematuria or flank pain.Marland Kitchen  Upon position to the emergency room, pulse ox was 99% on her percent nonrebreather with blood pressure of 148/63 with otherwise normal vital signs.  She was later tapered down to 5 L O2 by nasal cannula with a pulse symmetry of 97-9%.  Labs revealed hyponatremia with sodium 121 and chloride 86 with blood glucose of 166.  LDH was 447 ferritin 402 with CRP of 24.6 and lactic acid 1.2, procalcitonin less than 0.1 and CBC was unremarkable.  D-dimer was 1.15 and chest CTA is currently pending.  Fibrinogen was 1 800.  Blood culture was drawn.  Portable chest x-ray showed interval worsening of multifocal airspace opacities consistent with pneumonia. EKG showed sinus rhythm  with rate of 85 with probable left atrial enlargement and left bundle branch block.    Subjective:    Gertie Baron today she does report some dyspnea, denies any chest pain, nausea or vomiting .   Assessment  & Plan :    Active Problems:   Hypothyroidism  DM type 2 with diabetic peripheral neuropathy (HCC)   CAD (coronary artery disease)   Centrilobular emphysema (HCC)   Hyponatremia   HLD (hyperlipidemia)   COVID-19   Acute hypoxic respiratory failure due to COVID-19 pneumonia. -Patient is unvaccinated. -Patient with recent hospitalization for which she was treated with IV steroids, remdesivir, she was discharged on room air, to come back with hypoxia. -started on baricitinib -X-ray significant for worsening multifocal opacity. -No indications for antibiotics as procalcitonin within normal limits. -Patient with elevated CRP 24.6 (used to be 1.5 on discharge), she is back on IV steroids and Remdesivir. - continue to trend inflammatory markers. -The patient will be placed on scheduled Mucinex and as needed Tussionex.  Hyponatremia. - due to volume depletion, continue with IV fluids.  Hypertension. -Continue with home medication  Type II diabetes mellitus. -The patient will be placed on supplement coverage with NovoLog and will hold off Metformin.  We will start on Tradjenta  GERD. - continue with PPI.  Hypothyroidism. -Continue with Synthroid  Depression and bipolar disorder. -continue Prozac and Lamictal.  Obstructive sleep apnea. -on CPAP nightly.  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 -continue doxycycline.  Presents with fall, so CT head and left hip were checked with no acute findings    COVID-19 Labs  Recent Labs    05/12/20 2227  DDIMER 1.15*  FERRITIN 402*  LDH 447*  CRP 24.6*    Lab Results  Component Value Date   SARSCOV2NAA POSITIVE (A) 05/03/2020   SARSCOV2NAA NEGATIVE 01/21/2020   SARSCOV2NAA NEGATIVE 12/23/2019   SARSCOV2NAA  NEGATIVE 11/09/2019     Code Status : Full  Family Communication  : Cussed with daughter by phone  Disposition Plan  :  Status is: Inpatient  Remains inpatient appropriate because:IV treatments appropriate due to intensity of illness or inability to take PO   Dispo: The patient is from: Home              Anticipated d/c is to: Home              Anticipated d/c date is: 3 days              Patient currently is not medically stable to d/c.       Consults  :  None  Procedures  : None  DVT Prophylaxis  :  [place] lovenox  Lab Results  Component Value Date   PLT 235 05/12/2020    Antibiotics  :    Anti-infectives (From admission, onward)   Start     Dose/Rate Route Frequency Ordered Stop   05/14/20 1000  remdesivir 100 mg in sodium chloride 0.9 % 100 mL IVPB     Discontinue    "Followed by" Linked Group Details   100 mg 200 mL/hr over 30 Minutes Intravenous Daily 05/13/20 0015 05/18/20 0959   05/13/20 0130  doxycycline (VIBRA-TABS) tablet 100 mg     Discontinue     100 mg Oral 2 times daily 05/13/20 0113     05/13/20 0100  remdesivir 200 mg in sodium chloride 0.9% 250 mL IVPB       "Followed by" Linked Group Details   200 mg 580 mL/hr over 30 Minutes Intravenous Once 05/13/20 0015 05/13/20 0507        Objective:   Vitals:   05/12/20 2300 05/13/20 0734 05/13/20 1000 05/13/20 1425  BP: (!) 152/72 136/71 (!) 141/68 117/60  Pulse: 89 73 71 (!) 57  Resp: (!) 26 (!) 23 20 16   Temp:  TempSrc:      SpO2: 97% 100% 95% 99%  Weight:      Height:        Wt Readings from Last 3 Encounters:  05/12/20 (!) 169.8 kg  05/08/20 (!) 169.8 kg  04/15/20 (!) 170.1 kg     Intake/Output Summary (Last 24 hours) at 05/13/2020 1515 Last data filed at 05/13/2020 1126 Gross per 24 hour  Intake 139.78 ml  Output --  Net 139.78 ml     Physical Exam  Awake Alert, Oriented X 3, No new F.N deficits, Normal affect Symmetrical Chest wall movement, Good air movement bilaterally,  CTAB RRR,No Gallops,Rubs or new Murmurs, No Parasternal Heave +ve B.Sounds, Abd Soft, No tenderness,  No rebound - guarding or rigidity. No Cyanosis, Clubbing or edema, No new Rash or bruise      Data Review:    CBC Recent Labs  Lab 05/07/20 0500 05/08/20 0845 05/12/20 2227  WBC 4.5 6.0 8.4  HGB 10.3* 11.2* 12.2  HCT 33.2* 35.8* 37.5  PLT 174 192 235  MCV 87.4 85.9 83.0  MCH 27.1 26.9 27.0  MCHC 31.0 31.3 32.5  RDW 16.2* 16.2* 15.9*  LYMPHSABS 0.7 0.6* 0.5*  MONOABS 0.4 0.3 1.0  EOSABS 0.0 0.0 0.0  BASOSABS 0.0 0.0 0.0    Chemistries  Recent Labs  Lab 05/07/20 0500 05/08/20 0845 05/12/20 2227  NA 136 135 121*  K 4.4 4.4 4.6  CL 97* 95* 86*  CO2 26 27 23   GLUCOSE 226* 204* 166*  BUN 20 14 6*  CREATININE 0.64 0.56 0.61  CALCIUM 8.9 9.2 8.8*  MG 2.2 2.2  --   AST 35 29 43*  ALT 50* 51* 37  ALKPHOS 68 67 73  BILITOT 0.4 0.4 1.1   ------------------------------------------------------------------------------------------------------------------ Recent Labs    05/12/20 2227  TRIG 35    Lab Results  Component Value Date   HGBA1C 7.3 (H) 05/05/2020   ------------------------------------------------------------------------------------------------------------------ Recent Labs    05/13/20 0326  TSH 0.873   ------------------------------------------------------------------------------------------------------------------ Recent Labs    05/12/20 2227  FERRITIN 402*    Coagulation profile No results for input(s): INR, PROTIME in the last 168 hours.  Recent Labs    05/12/20 2227  DDIMER 1.15*    Cardiac Enzymes No results for input(s): CKMB, TROPONINI, MYOGLOBIN in the last 168 hours.  Invalid input(s): CK ------------------------------------------------------------------------------------------------------------------    Component Value Date/Time   BNP 143.6 (H) 11/11/2019 0321    Inpatient Medications  Scheduled Meds: . ascorbic acid   500 mg Oral Daily  . aspirin EC  81 mg Oral QHS  . baricitinib  4 mg Oral Daily  . carvedilol  6.25 mg Oral BID WC  . cholecalciferol  1,000 Units Oral Daily  . dexamethasone (DECADRON) injection  6 mg Intravenous Daily  . dicyclomine  20 mg Oral BID  . doxycycline  100 mg Oral BID  . enoxaparin (LOVENOX) injection  85 mg Subcutaneous Q24H  . guaiFENesin  600 mg Oral BID  . hydrALAZINE  10 mg Oral Q6H  . insulin aspart  0-9 Units Subcutaneous TID PC & HS  . insulin detemir  0.075 Units/kg Subcutaneous BID  . lamoTRIgine  200 mg Oral Daily  . levothyroxine  150 mcg Oral QHS  . linagliptin  5 mg Oral Daily  . losartan  50 mg Oral Daily  . montelukast  10 mg Oral QHS  . multivitamin with minerals  1 tablet Oral Daily  . OXcarbazepine  300 mg Oral Daily  And  . OXcarbazepine  600 mg Oral QHS  . pantoprazole  40 mg Oral Daily  . simvastatin  40 mg Oral QPM  . zinc sulfate  220 mg Oral Daily   Continuous Infusions: . sodium chloride 100 mL/hr at 05/13/20 0500  . [START ON 05/14/2020] remdesivir 100 mg in NS 100 mL     PRN Meds:.acetaminophen, albuterol, furosemide, guaiFENesin-dextromethorphan, magnesium hydroxide, polyethylene glycol, prochlorperazine, traZODone, zolpidem  Micro Results Recent Results (from the past 240 hour(s))  Blood Culture (routine x 2)     Status: None   Collection Time: 05/03/20  3:30 PM   Specimen: BLOOD  Result Value Ref Range Status   Specimen Description   Final    BLOOD BLOOD LEFT FOREARM Performed at [facility], [address] [place], [place] [address]    Special Requests   Final    BOTTLES DRAWN AEROBIC ONLY Blood Culture results may not be optimal due to an inadequate volume of blood received in culture bottles Performed at [facility], [address] [place], [place] [address]    Culture   Final    NO GROWTH 5 DAYS Performed at [facility], [address], [place], [place] [address]    Report Status  05/08/2020 FINAL  Final  Blood Culture (routine x 2)     Status: None   Collection Time: 05/03/20  3:41 PM   Specimen: BLOOD  Result Value Ref Range Status   Specimen Description   Final    BLOOD LEFT ANTECUBITAL Performed at [facility], [address] [place], [place] [address]    Special Requests   Final    BOTTLES DRAWN AEROBIC AND ANAEROBIC Blood Culture adequate volume Performed at [facility], [address] [place], [place] [address]    Culture   Final    NO GROWTH 5 DAYS Performed at [facility], [address], [place], [place] [address]    Report Status 05/08/2020 FINAL  Final  SARS Coronavirus 2 by RT PCR (hospital order, performed in [facility] hospital lab) Nasopharyngeal Nasopharyngeal Swab     Status: Abnormal   Collection Time: 05/03/20  4:32 PM   Specimen: Nasopharyngeal Swab  Result Value Ref Range Status   SARS Coronavirus 2 POSITIVE (A) NEGATIVE Final    Comment: RESULT CALLED TO, READ BACK BY AND VERIFIED WITH: JAMIE BAILEY RN @1746  05/03/2020 OLSONM (NOTE) SARS-CoV-2 target nucleic acids are DETECTED  SARS-CoV-2 RNA is generally detectable in upper respiratory specimens  during the acute phase of infection.  Positive results are indicative  of the presence of the identified virus, but do not rule out bacterial infection or co-infection with other pathogens not detected by the test.  Clinical correlation with patient history and  other diagnostic information is necessary to determine patient infection status.  The expected result is negative.  Fact Sheet for Patients:   07/03/2020   Fact Sheet for Healthcare Providers:   BoilerBrush.com.cy    This test is not yet approved or cleared by the https://pope.com/ FDA and  has been authorized for detection and/or diagnosis of SARS-CoV-2 by FDA under an Emergency Use Authorization (EUA).  This EUA will remain in effect  (meaning th is test can be used) for the duration of  the COVID-19 declaration under Section 564(b)(1) of the Act, 21 U.S.C. section 360-bbb-3(b)(1), unless the authorization is terminated or revoked sooner.  Performed at [facility], [address] [place], [place] [address]   Culture, Urine     Status: Abnormal   Collection Time: 05/05/20  2:47 PM   Specimen: Urine, Clean Catch  Result Value Ref Range Status   Specimen Description URINE, CLEAN CATCH  Final   Special Requests   Final    NONE Performed at [facility], [address], [place], [place] [address]    Culture >=100,000 COLONIES/mL PROTEUS MIRABILIS (A)  Final   Report Status 05/07/2020 FINAL  Final   Organism ID, Bacteria PROTEUS MIRABILIS (A)  Final      Susceptibility   Proteus mirabilis - MIC*    AMPICILLIN <=2 SENSITIVE Sensitive     CEFAZOLIN <=4 SENSITIVE Sensitive     CEFTRIAXONE <=0.25 SENSITIVE Sensitive     CIPROFLOXACIN <=0.25 SENSITIVE Sensitive     GENTAMICIN <=1 SENSITIVE Sensitive     IMIPENEM 4 SENSITIVE Sensitive     NITROFURANTOIN 128 RESISTANT Resistant     TRIMETH/SULFA <=20 SENSITIVE Sensitive     AMPICILLIN/SULBACTAM <=2 SENSITIVE Sensitive     * >=100,000 COLONIES/mL PROTEUS MIRABILIS  Blood Culture (routine x 2)     Status: None (Preliminary result)   Collection Time: 05/12/20 10:33 PM   Specimen: BLOOD  Result Value Ref Range Status   Specimen Description BLOOD RIGHT FOREARM  Final   Special Requests   Final    BOTTLES DRAWN AEROBIC AND ANAEROBIC Blood Culture adequate volume   Culture   Final    NO GROWTH < 12 HOURS Performed at [facility], [address] [place], [place] [address]    Report Status PENDING  Incomplete    Radiology Reports CT HEAD WO CONTRAST  Result Date: 05/13/2020 CLINICAL DATA:  Fall with facial trauma. EXAM: CT HEAD WITHOUT CONTRAST TECHNIQUE: Contiguous axial images were obtained from the base of the skull through the vertex  without intravenous contrast. COMPARISON:  05/26/2011 FINDINGS: Brain: Ventricles, cisterns and other CSF spaces are normal. There is no mass, mass effect, shift of midline structures or acute hemorrhage. No evidence of acute infarction. Vascular: No hyperdense vessel or unexpected calcification. Skull: Normal. Negative for fracture or focal lesion. Sinuses/Orbits: No acute finding. Other: None. IMPRESSION: No acute findings. Electronically Signed   By: Elberta Fortis M.D.   On: 05/13/2020 11:30   DG Chest Port 1 View  Result Date: 05/12/2020 CLINICAL DATA:  COVID positive shortness of breath EXAM: PORTABLE CHEST 1 VIEW COMPARISON:  May 03, 2020 FINDINGS: The heart size and mediastinal contours are unchanged with mild cardiomegaly. Aortic knob calcifications. Interval significant worsening in the multifocal patchy hazy airspace opacities throughout both lungs, right worse than left. No pleural effusion. No acute osseous abnormality. Cervical fixation hardware seen. IMPRESSION: Interval worsening in the multifocal airspace opacities consistent with pneumonia. Electronically Signed   By: Jonna Clark M.D.   On: 05/12/2020 22:26   DG Chest Port 1 View  Result Date: 05/03/2020 CLINICAL DATA:  Shortness of breath.  Hypoxia.  COVID-19 positive. EXAM: PORTABLE CHEST 1 VIEW COMPARISON:  Chest radiograph 10/31/2019 and CTA 11/09/2019 FINDINGS: The cardiac silhouette remains mildly enlarged. Aortic atherosclerosis is noted. There is persistent mild elevation of the right hemidiaphragm. Compared to the prior radiograph, there is increased pulmonary vascular congestion with mild interstitial densities bilaterally. No sizable pleural effusion or pneumothorax is identified. Prior cervical fusion is noted. IMPRESSION: Cardiomegaly with pulmonary vascular congestion and mild bilateral interstitial opacities which may reflect edema or  atypical/viral infection. Electronically Signed   By: Sebastian Ache M.D.   On: 05/03/2020  15:42   DG HIP UNILAT WITH PELVIS 2-3 VIEWS LEFT  Result Date: 05/13/2020 CLINICAL DATA:  Fall. EXAM: DG HIP (WITH OR WITHOUT PELVIS) 2-3V LEFT COMPARISON:  10/20/2016 FINDINGS: Left hip arthroplasty intact and unchanged. Moderate osteoarthritic change of the right hip. Stable irregularity over the inferior left acetabular region. Degenerate change of the spine with partially visualized fusion hardware intact over the lower lumbar spine. Mild degenerate change of the sacroiliac joints. IMPRESSION: 1. No acute findings. 2. Moderate osteoarthritic change of the right hip. Left hip arthroplasty intact. Electronically Signed   By: Elberta Fortis M.D.   On: 05/13/2020 11:28   SLEEP STUDY DOCUMENTS  Result Date: 04/25/2020 Ordered by an unspecified provider.  SLEEP STUDY DOCUMENTS  Result Date: 04/18/2020 Ordered by an unspecified provider.     Huey Bienenstock M.D on 05/13/2020 at 3:15 PM    [facility] -  Office  [phone]

## 2020-05-13 NOTE — Consult Note (Addendum)
Nurse attempted access x2, both unsuccessful.

## 2020-05-13 NOTE — Progress Notes (Signed)
ANTICOAGULATION CONSULT NOTE - Initial Consult  Pharmacy Consult for Heparin  Indication: Rule out VTE in setting of COVID-19 infection  Allergies  Allergen Reactions  . Gala Murdoch [Fesoterodine] Nausea And Vomiting    Severe reaction - s/p several ER visits then hospitalization ?stress induced cardiomyopathy  . Cephalexin Hives  . Hydrocodone Other (See Comments)    Reaction:  Hallucinations   . Entresto [Sacubitril-Valsartan]   . Tolterodine Tartrate Nausea And Vomiting  . Risperidone And Related Other (See Comments)    Reaction:  Made pt excessively sleepy  . Seroquel [Quetiapine Fumarate] Other (See Comments)    Reaction:  Made pt excessively sleepy  . Sulfa Antibiotics Rash   Patient Measurements: Height: 5\' 4"  (162.6 cm) Weight: (!) 169.8 kg (374 lb 5.5 oz) IBW/kg (Calculated) : 54.7  Vital Signs: Temp: 97.7 F (36.5 C) (08/14 2045) Temp Source: Oral (08/14 2045) BP: 152/72 (08/14 2300) Pulse Rate: 89 (08/14 2300)  Labs: Recent Labs    05/12/20 2227  HGB 12.2  HCT 37.5  PLT 235  CREATININE 0.61    Estimated Creatinine Clearance: 108.5 mL/min (by C-G formula based on SCr of 0.61 mg/dL).   Medical History: Past Medical History:  Diagnosis Date  . Allergic rhinitis   . Ambulates with cane    straight  . Anemia   . Anxiety   . Asthma    seasonal  . Bipolar affective disorder (HCC)    takes Synthroid meds for Bipolar  . CAD (coronary artery disease) 07/2016   by CT scan  . Carpal tunnel syndrome    had surgery but occasional still has some issues per patient  . Cataract   . Centrilobular emphysema (HCC) 07/2016   by CT scan - pt not aware of this  . Constipation due to pain medication   . Depression with anxiety   . Diabetes mellitus    type 2 - no meds diet controlled  . GERD (gastroesophageal reflux disease)   . History of blood transfusion   . 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 Hyperlipidemia   .  Hypertension   . Hypothyroidism   . OSA (obstructive sleep apnea)    no longer using cpap, uses a bed that raises and lowers hob  . Osteoarthritis   . Osteopenia 08/11/2019   DEXA 07/2019 - T -1.1 R femur (osteopenia)  . Pneumonia   . Restless legs   . Septic arthritis (HCC) 10/11/2012  . Shingles 06/30/2016  . Status post revision of total hip replacement bilateral   prosthetic infection R 2013, L 2015  . Thoracic aortic atherosclerosis (HCC) 11/207   by CT    Assessment: 69 y/o F with known COVID-19 infection presents back to the ED with worsening dyspnea and hypoxemia. CRP from last visit was 1.5, now up to 24.6. D-dimer mildly elevated. Wanting to r/o PE, but unable to get CT angio for now, starting heparin.  Goal of Therapy:  Heparin level 0.3-0.7 units/ml Monitor platelets by anticoagulation protocol: Yes   Plan:  Heparin 5000 units BOLUS Start heparin drip at 1500 units/hr 1100 heparin level Daily CBC/HL Monitor for bleeding F/U ability to get CT angio  80, PharmD, BCPS Clinical Pharmacist Phone: [phone]

## 2020-05-13 NOTE — ED Notes (Addendum)
Pt did not touch her breakfast tray from this morning. Pt asked if she was hungry when this tech brought her lunch tray to her and she shook her head no.

## 2020-05-13 NOTE — H&P (Addendum)
[place]    PATIENT NAME: Amy Barnes    MR#:  [id number]  DATE OF BIRTH:  19-Dec-1950  DATE OF ADMISSION:  05/12/2020  PRIMARY CARE PHYSICIAN: [name], MD   REQUESTING/REFERRING PHYSICIAN: Little, Ambrose Finland, MD  CHIEF COMPLAINT:   Chief Complaint  Patient presents with  . Shortness of Breath    HISTORY OF PRESENT ILLNESS:  Amy Barnes  is a 70 y.o. Caucasian female with a known history of coronary artery disease, type 2 diabetes mellitus, hypertension, dyslipidemia, hypothyroidism and obstructive sleep apnea as well as recent COVID-19 for which she was admitted here and discharged 5 days ago on room air, who presents to the emergency room with acute onset of worsening dyspnea with hypoxemia.  Her pulse currently was 83% on room air.  She continues to have cough occasionally productive of clear sputum.  No fever or chills.  No nausea or vomiting or abdominal pain.  She continues to have no taste or smell.  No chest pain or palpitations.  No dysuria, oliguria or hematuria or flank pain.Marland Kitchen  Upon presentation to the emergency room, pulse ox was 99% on her percent nonrebreather with blood pressure of 148/63 with otherwise normal vital signs.  She was later tapered down to 5 L O2 by nasal cannula with a pulse symmetry of 97-9%.  Labs revealed hyponatremia with sodium 121 and chloride 86 with blood glucose of 166.  LDH was 447 ferritin 402 with CRP of 24.6 and lactic acid 1.2, procalcitonin less than 0.1 and CBC was unremarkable.  D-dimer was 1.15 and chest CTA is currently pending.  Fibrinogen was 1 800.  Blood culture was drawn.  Portable chest x-ray showed interval worsening of multifocal airspace opacities consistent with pneumonia. EKG showed sinus rhythm with rate of 85 with probable left atrial enlargement and left bundle branch block.  The patient will be admitted to a medical telemetry bed for further evaluation and management. PAST MEDICAL HISTORY:   Past Medical  History:  Diagnosis Date  . Allergic rhinitis   . Ambulates with cane    straight  . Anemia   . Anxiety   . Asthma    seasonal  . Bipolar affective disorder (HCC)    takes Synthroid meds for Bipolar  . CAD (coronary artery disease) 07/2016   by CT scan  . Carpal tunnel syndrome    had surgery but occasional still has some issues per patient  . Cataract   . Centrilobular emphysema (HCC) 07/2016   by CT scan - pt not aware of this  . Constipation due to pain medication   . Depression with anxiety   . Diabetes mellitus    type 2 - no meds diet controlled  . GERD (gastroesophageal reflux disease)   . History of blood transfusion   . 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 Hyperlipidemia   . Hypertension   . Hypothyroidism   . OSA (obstructive sleep apnea)    no longer using cpap, uses a bed that raises and lowers hob  . Osteoarthritis   . Osteopenia 08/11/2019   DEXA 07/2019 - T -1.1 R femur (osteopenia)  . Pneumonia   . Restless legs   . Septic arthritis (HCC) 10/11/2012  . Shingles 06/30/2016  . Status post revision of total hip replacement bilateral   prosthetic infection R 2013, L 2015  . Thoracic aortic atherosclerosis (HCC) 11/207   by CT  PAST SURGICAL HISTORY:   Past Surgical History:  Procedure Laterality Date  . BREAST CYST ASPIRATION    . CARPAL TUNNEL RELEASE Right 05/15/2011  . CARPAL TUNNEL RELEASE Left 12/24/2017   Procedure: LEFT CARPAL TUNNEL RELEASE;  Surgeon: [name], MD;  Location: [facility];  Service: Orthopedics;  Laterality: Left;  . CERVICAL FUSION  2012   C2/3/4  . COLONOSCOPY WITH PROPOFOL N/A 12/31/2015   diverticulosis, int hem, o/w normal rpt 10 yrs (Rein)  . ESOPHAGOGASTRODUODENOSCOPY (EGD) WITH PROPOFOL N/A 11/12/2019   Procedure: ESOPHAGOGASTRODUODENOSCOPY (EGD) WITH PROPOFOL;  Surgeon: Sherrilyn Rist, MD;  Location: [facility];  Service: Gastroenterology;  Laterality: N/A;  .  EYE SURGERY Bilateral    cataract surgery with lens implant  . FOOT SURGERY  1982   bone spur  . I & D EXTREMITY  09/06/2012   Budd Palmer, MD; Right;  I&D of right thigh  . I & D EXTREMITY Left 07/2013   wound vac - daily doxycycline indefinitely  . KNEE ARTHROSCOPY  09/01/2012   Dannielle Huh, MD;  Right  . LEFT HEART CATH AND CORONARY ANGIOGRAPHY N/A 11/10/2019   Procedure: LEFT HEART CATH AND CORONARY ANGIOGRAPHY;  Surgeon: Lennette Bihari, MD;  Location: [facility];  Service: Cardiovascular;  Laterality: N/A;  . LUMBAR FUSION  01/08/2012   L3-4  . LUMBAR FUSION  02/2019   unexpectedly discovered MRSA infection - pus Lovell Sheehan)   . LUMBAR LAMINECTOMY/DECOMPRESSION MICRODISCECTOMY N/A 11/13/2016   LAMINOTOMY/LAMINECTOMY LUMBAR FOUR LUMBAR FIVE  WITH RESECTION OF SYNOVIAL CYST;  Surgeon: Tressie Stalker, MD  . NECK SURGERY     Herniated disk C2,3,4  . NOSE SURGERY    . PARTIAL HYSTERECTOMY  1984   for mennorhagia, ovaries remain  . REVISION TOTAL HIP ARTHROPLASTY Left 08/28/2011  . TMJ ARTHROPLASTY  1982  . TOTAL HIP ARTHROPLASTY Left 2002  . TRIGGER FINGER RELEASE Right 05/15/2011   long finger    SOCIAL HISTORY:   Social History   Tobacco Use  . Smoking status: Former Smoker    Packs/day: 1.50    Years: 40.00    Pack years: 60.00    Types: Cigarettes    Quit date: 04/29/2009    Years since quitting: 11.0  . Smokeless tobacco: Never Used  Substance Use Topics  . Alcohol use: Yes    Alcohol/week: 2.0 standard drinks    Types: 2 Shots of liquor per week    Comment: 2 shots/weekly    FAMILY HISTORY:   Family History  Problem Relation Age of Onset  . Aneurysm Father 62       brain  . Alcohol abuse Father   . Cancer Father        possibly  . CAD Other        several siblings  . Cancer Brother        prostate  . Diabetes Brother   . Diabetes Sister   . Anesthesia problems Neg Hx   . Hypotension Neg Hx   . Malignant hyperthermia Neg Hx   . Pseudochol  deficiency Neg Hx   . Breast cancer Neg Hx     DRUG ALLERGIES:   Allergies  Allergen Reactions  . Gala Murdoch [Fesoterodine] Nausea And Vomiting    Severe reaction - s/p several ER visits then hospitalization ?stress induced cardiomyopathy  . Cephalexin Hives  . Hydrocodone Other (See Comments)    Reaction:  Hallucinations   . Entresto [Sacubitril-Valsartan]   .  Tolterodine Tartrate Nausea And Vomiting  . Risperidone And Related Other (See Comments)    Reaction:  Made pt excessively sleepy  . Seroquel [Quetiapine Fumarate] Other (See Comments)    Reaction:  Made pt excessively sleepy  . Sulfa Antibiotics Rash    REVIEW OF SYSTEMS:   ROS As per history of present illness. All pertinent systems were reviewed above. Constitutional, HEENT, cardiovascular, respiratory, GI, GU, musculoskeletal, neuro, psychiatric, endocrine, integumentary and hematologic systems were reviewed and are otherwise negative/unremarkable except for positive findings mentioned above in the HPI.   MEDICATIONS AT HOME:   Prior to Admission medications   Medication Sig Start Date End Date Taking? Authorizing Provider  albuterol (PROVENTIL HFA;VENTOLIN HFA) 108 (90 Base) MCG/ACT inhaler Inhale 2 puffs into the lungs every 6 (six) hours as needed for wheezing or shortness of breath. 12/14/18   [name], MD  ascorbic acid (VITAMIN C) 500 MG tablet Take 1 tablet (500 mg total) by mouth daily. 05/09/20   Drema Dallas, MD  aspirin 81 MG EC tablet Take 81 mg by mouth at bedtime.     [provider]  budesonide-formoterol (SYMBICORT) 160-4.5 MCG/ACT inhaler Inhale 2 puffs into the lungs 2 (two) times daily. 11/07/19   [name], MD  carvedilol (COREG) 6.25 MG tablet Take 1 tablet (6.25 mg total) by mouth 2 (two) times daily. 12/16/19 05/04/20  [name], MD  celecoxib (CELEBREX) 200 MG capsule Take 1 capsule (200 mg total) by mouth daily. 01/13/20   [name], MD  dicyclomine (BENTYL) 20  MG tablet Take 1 tablet (20 mg total) by mouth in the morning and at bedtime. 11/21/19   Zehr, Princella Pellegrini, PA-C  doxycycline (VIBRA-TABS) 100 MG tablet Take 1 tablet (100 mg total) by mouth 2 (two) times daily. 12/21/19   Judyann Munson, MD  fluconazole (DIFLUCAN) 200 MG tablet Take 1 tablet (200 mg total) by mouth daily. X 5 days as needed for yeast infection 12/21/19   Judyann Munson, MD  FLUoxetine (PROZAC) 40 MG capsule Take by mouth daily. 03/08/19   [provider]  furosemide (LASIX) 20 MG tablet Take 1 tablet (20 mg total) by mouth daily as needed for edema. 02/23/20 05/04/20  Rollene Rotunda, MD  hydrALAZINE (APRESOLINE) 10 MG tablet Take 1 tablet (10 mg total) by mouth every 6 (six) hours. 05/08/20   Drema Dallas, MD  lamoTRIgine (LAMICTAL) 200 MG tablet Take 200 mg by mouth daily.      [provider]  levothyroxine (SYNTHROID) 150 MCG tablet TAKE ONE TABLET BY MOUTH AT BEDTIME 03/19/20   [name], MD  losartan (COZAAR) 50 MG tablet Take 1 tablet (50 mg total) by mouth daily. 12/29/19 05/04/20  Abelino Derrick, PA-C  metFORMIN (GLUCOPHAGE) 500 MG tablet Take 1 tablet (500 mg total) by mouth every evening. 01/27/20   [name], MD  montelukast (SINGULAIR) 10 MG tablet Take 1 tablet (10 mg total) by mouth at bedtime. 11/25/19   [name], MD  Multiple Vitamin (MULTIVITAMIN WITH MINERALS) TABS tablet Take 2 tablets by mouth daily.     [provider]  ondansetron (ZOFRAN ODT) 8 MG disintegrating tablet Take 1 tablet (8 mg total) by mouth 2 (two) times daily as needed for nausea or vomiting. 11/14/19   Hughie Closs, MD  Oxcarbazepine (TRILEPTAL) 300 MG tablet Take 300-600 mg by mouth See admin instructions. Take 300 mg by mouth in the morning and 600 mg at night    [provider]  oxybutynin (DITROPAN-XL) 10 MG 24 hr tablet Take 1 tablet (10 mg total) by mouth daily. 11/03/19   Jerilee Field, MD  pantoprazole (PROTONIX) 40 MG tablet Take 1  tablet (40 mg total) by mouth 2 (two) times daily. Patient taking differently: Take 40 mg by mouth daily.  11/25/19   [name], MD  polyethylene glycol (MIRALAX / GLYCOLAX) 17 g packet Take 17 g by mouth 2 (two) times daily as needed for mild constipation. 11/14/19   Hughie Closs, MD  prochlorperazine (COMPAZINE) 10 MG tablet Take 1 tablet (10 mg total) by mouth every 6 (six) hours as needed for nausea or vomiting. 11/21/19   Zehr, Princella Pellegrini, PA-C  simvastatin (ZOCOR) 40 MG tablet Take 1 tablet (40 mg total) by mouth every evening. 01/09/20   [name], MD  zaleplon (SONATA) 10 MG capsule Take 10 mg by mouth at bedtime as needed for sleep.    [provider]  zinc sulfate 220 (50 Zn) MG capsule Take 1 capsule (220 mg total) by mouth daily. 05/09/20   Drema Dallas, MD      VITAL SIGNS:  Blood pressure (!) 152/72, pulse 89, temperature 97.7 F (36.5 C), temperature source Oral, resp. rate (!) 26, height 5\' 4"  (1.626 m), weight (!) 169.8 kg, SpO2 97 %.  PHYSICAL EXAMINATION:  Physical Exam  GENERAL:  69 y.o.-year-old obese Caucasian female patient lying in the bed with mild respiratory distress with conversational dyspnea. EYES: Pupils equal, round, reactive to light and accommodation. No scleral icterus. Extraocular muscles intact.  HEENT: Head atraumatic, normocephalic. Oropharynx and nasopharynx clear.  NECK:  Supple, no jugular venous distention. No thyroid enlargement, no tenderness.  LUNGS: Diminished bibasal breath sounds with bibasal and midlung zone crackles.79 CARDIOVASCULAR: Regular rate and rhythm, S1, S2 normal. No murmurs, rubs, or gallops.  ABDOMEN: Soft, nondistended, nontender. Bowel sounds present. No organomegaly or mass.  EXTREMITIES: No pedal edema, cyanosis, or clubbing.  NEUROLOGIC: Cranial nerves II through XII are intact. Muscle strength 5/5 in all extremities. Sensation intact. Gait not checked.  PSYCHIATRIC: The patient is alert and oriented x  3.  Normal affect and good eye contact. SKIN: No obvious rash, lesion, or ulcer.   LABORATORY PANEL:   CBC Recent Labs  Lab 05/12/20 2227  WBC 8.4  HGB 12.2  HCT 37.5  PLT 235   ------------------------------------------------------------------------------------------------------------------  Chemistries  Recent Labs  Lab 05/08/20 0845 05/08/20 0845 05/12/20 2227  NA 135   < > 121*  K 4.4   < > 4.6  CL 95*   < > 86*  CO2 27   < > 23  GLUCOSE 204*   < > 166*  BUN 14   < > 6*  CREATININE 0.56   < > 0.61  CALCIUM 9.2   < > 8.8*  MG 2.2  --   --   AST 29   < > 43*  ALT 51*   < > 37  ALKPHOS 67   < > 73  BILITOT 0.4   < > 1.1   < > = values in this interval not displayed.   ------------------------------------------------------------------------------------------------------------------  Cardiac Enzymes No results for input(s): TROPONINI in the last 168 hours. ------------------------------------------------------------------------------------------------------------------  RADIOLOGY:  DG Chest Port 1 View  Result Date: 05/12/2020 CLINICAL DATA:  COVID positive shortness of breath EXAM: PORTABLE CHEST 1 VIEW COMPARISON:  May 03, 2020 FINDINGS: The heart size and mediastinal contours are unchanged with mild cardiomegaly. Aortic knob calcifications. Interval significant worsening in  the multifocal patchy hazy airspace opacities throughout both lungs, right worse than left. No pleural effusion. No acute osseous abnormality. Cervical fixation hardware seen. IMPRESSION: Interval worsening in the multifocal airspace opacities consistent with pneumonia. Electronically Signed   By: Jonna Clark M.D.   On: 05/12/2020 22:26      IMPRESSION AND PLAN:  1.  Acute hypoxemic respiratory failure secondary to COVID-19. -The patient will be admitted to a medically monitored isolation bed. -O2 protocol will be followed to keep O2 saturation above 93.   2.  Multifocal pneumonia  secondary to COVID-19. -The patient will be admitted to an isolation monitored bed with droplet and contact precautions. -Given multifocal pneumonia we will empirically place the patient on IV Rocephin and Zithromax for possible bacterial superinfection only with elevated Procalcitonin. -The patient will be placed on scheduled Mucinex and as needed Tussionex. -We will avoid nebulization as much as we can, give bronchodilator MDI if needed, and with deterioration of oxygenation try to avoid BiPAP/CPAP if possible.    -Will obtain sputum Gram stain culture and sensitivity and follow blood cultures. -O2 protocol will be followed. -We will follow CRP, ferritin, LDH and D-dimer. -Will follow manual differential for ANC/ALC ratio as well as follow troponin I and daily CBC with manual differential and CMP. - Will place the patient on IV Remdesivir and IV steroid therapy with Decadron with elevated inflammatory markers. -I discussed Baricitinib` with the patient and she agreed to proceed with it. -The patient will be placed on vitamin D3, vitamin C, zinc sulfate, p.o. Pepcid and aspirin. -Actemra can be considered for CRP more than 7 with associated hypoxemia.  3.  Hyponatremia. -The patient will be hydrated with IV normal saline and will obtain hyponatremia work-up.  4.  Hypertension. -We will continue hydralazine and Cozaar.  5.  Type II diabetes mellitus. -The patient will be placed on supplement coverage with NovoLog and will hold off Metformin.  6.  GERD. -PPI therapy will be resumed.  7.  Hypothyroidism. -We will check TSH and continue Synthroid.  8.  Depression and bipolar disorder. -We will continue Prozac and Lamictal.  9.  Obstructive sleep apnea. -Should be placed on CPAP nightly.  10.Chronic MRSA inBackHardware. -We will continue doxycycline.  11.  DVT prophylaxis. -Subcutaneous Lovenox    All the records are reviewed and case discussed with ED provider. The plan of  care was discussed in details with the patient (and family). I answered all questions. The patient agreed to proceed with the above mentioned plan. Further management will depend upon hospital course.   CODE STATUS: Full code  Status is: Inpatient  Remains inpatient appropriate because:Hemodynamically unstable, Altered mental status, Ongoing diagnostic testing needed not appropriate for outpatient work up, Unsafe d/c plan, IV treatments appropriate due to intensity of illness or inability to take PO and Inpatient level of care appropriate due to severity of illness   Dispo: The patient is from: Home              Anticipated d/c is to: Home              Anticipated d/c date is: 3 days              Patient currently is not medically stable to d/c.  TOTAL TIME TAKING CARE OF THIS PATIENT: 60 minutes.    Hannah Beat M.D on 05/13/2020 at 12:16 AM  [facility]   From 7 PM-7 AM, contact night-coverage [email or web address]  CC: Primary care  physician; [name], MD   Note: This dictation was prepared with Dragon dictation along with smaller phrase technology. Any transcriptional typo errors that result from this process are unintentional.

## 2020-05-14 DIAGNOSIS — E1142 Type 2 diabetes mellitus with diabetic polyneuropathy: Secondary | ICD-10-CM | POA: Diagnosis not present

## 2020-05-14 DIAGNOSIS — U071 COVID-19: Secondary | ICD-10-CM | POA: Diagnosis not present

## 2020-05-14 DIAGNOSIS — J9601 Acute respiratory failure with hypoxia: Secondary | ICD-10-CM | POA: Diagnosis not present

## 2020-05-14 DIAGNOSIS — E871 Hypo-osmolality and hyponatremia: Secondary | ICD-10-CM | POA: Diagnosis not present

## 2020-05-14 LAB — BRAIN NATRIURETIC PEPTIDE: B Natriuretic Peptide: 90.5 pg/mL (ref 0.0–100.0)

## 2020-05-14 LAB — GLUCOSE, CAPILLARY
Glucose-Capillary: 195 mg/dL — ABNORMAL HIGH (ref 70–99)
Glucose-Capillary: 148 mg/dL — ABNORMAL HIGH (ref 70–99)
Glucose-Capillary: 196 mg/dL — ABNORMAL HIGH (ref 70–99)
Glucose-Capillary: 208 mg/dL — ABNORMAL HIGH (ref 70–99)
Glucose-Capillary: 229 mg/dL — ABNORMAL HIGH (ref 70–99)

## 2020-05-14 LAB — CBC WITH DIFFERENTIAL/PLATELET
Abs Immature Granulocytes: 0.03 10*3/uL (ref 0.00–0.07)
Basophils Absolute: 0 10*3/uL (ref 0.0–0.1)
Basophils Relative: 0 %
Eosinophils Absolute: 0 10*3/uL (ref 0.0–0.5)
Eosinophils Relative: 0 %
HCT: 32 % — ABNORMAL LOW (ref 36.0–46.0)
Hemoglobin: 10.5 g/dL — ABNORMAL LOW (ref 12.0–15.0)
Immature Granulocytes: 1 %
Lymphocytes Relative: 13 %
Lymphs Abs: 0.6 10*3/uL — ABNORMAL LOW (ref 0.7–4.0)
MCH: 27.3 pg (ref 26.0–34.0)
MCHC: 32.8 g/dL (ref 30.0–36.0)
MCV: 83.1 fL (ref 80.0–100.0)
Monocytes Absolute: 0.8 10*3/uL (ref 0.1–1.0)
Monocytes Relative: 15 %
Neutro Abs: 3.6 10*3/uL (ref 1.7–7.7)
Neutrophils Relative %: 71 %
Platelets: 219 10*3/uL (ref 150–400)
RBC: 3.85 MIL/uL — ABNORMAL LOW (ref 3.87–5.11)
RDW: 15.5 % (ref 11.5–15.5)
WBC: 5 10*3/uL (ref 4.0–10.5)
nRBC: 0 % (ref 0.0–0.2)

## 2020-05-14 LAB — COMPREHENSIVE METABOLIC PANEL
ALT: 36 U/L (ref 0–44)
AST: 38 U/L (ref 15–41)
Albumin: 2.5 g/dL — ABNORMAL LOW (ref 3.5–5.0)
Alkaline Phosphatase: 64 U/L (ref 38–126)
Anion gap: 10 (ref 5–15)
BUN: 16 mg/dL (ref 8–23)
CO2: 25 mmol/L (ref 22–32)
Calcium: 8.4 mg/dL — ABNORMAL LOW (ref 8.9–10.3)
Chloride: 90 mmol/L — ABNORMAL LOW (ref 98–111)
Creatinine, Ser: 0.74 mg/dL (ref 0.44–1.00)
GFR calc Af Amer: >60 mL/min (ref >60)
GFR calc non Af Amer: >60 mL/min (ref >60)
Glucose, Bld: 141 mg/dL — ABNORMAL HIGH (ref 70–99)
Potassium: 4.3 mmol/L (ref 3.5–5.1)
Sodium: 125 mmol/L — ABNORMAL LOW (ref 135–145)
Total Bilirubin: 0.9 mg/dL (ref 0.3–1.2)
Total Protein: 5.7 g/dL — ABNORMAL LOW (ref 6.5–8.1)

## 2020-05-14 LAB — C-REACTIVE PROTEIN: CRP: 20.9 mg/dL — ABNORMAL HIGH (ref <1.0)

## 2020-05-14 LAB — D-DIMER, QUANTITATIVE: D-Dimer, Quant: 1.19 ug/mL-FEU — ABNORMAL HIGH (ref 0.00–0.50)

## 2020-05-14 LAB — FERRITIN: Ferritin: 476 ng/mL — ABNORMAL HIGH (ref 11–307)

## 2020-05-14 MED ORDER — SODIUM CHLORIDE 1 G PO TABS
2.0000 g | ORAL_TABLET | Freq: Once | ORAL | Status: AC
  Administered 2020-05-14: 2 g via ORAL
  Filled 2020-05-14: qty 2

## 2020-05-14 NOTE — Progress Notes (Signed)
Initial Nutrition Assessment  DOCUMENTATION CODES:   Morbid obesity  INTERVENTION:    Ensure Enlive po BID, each supplement provides 350 kcal and 20 grams of protein  MVI daily   NUTRITION DIAGNOSIS:   Increased nutrient needs related to acute illness (COVID 19) as evidenced by estimated needs.  GOAL:   Patient will meet greater than or equal to 90% of their needs  MONITOR:   PO intake, Supplement acceptance, Weight trends, Labs, I & O's  REASON FOR ASSESSMENT:   Malnutrition Screening Tool    ASSESSMENT:   Patient with PMH significant for CAD, DM, HTN, and OSA. Presents this admission with COVID 19 PNA.   Pt endorses loss in appetite 2 weeks PTA due to ongoing fatigue and lack of taste. States during this time she consumed 3 smaller meals daily that consisted of eggs, sandwiches, and salads. Inconsistently consumed Ensure Plus once per day. Appetite here progressing. Meal completions lack documentation. Discussed the importance of protein intake. Pt willing to take Ensure Enlive.   Pt reports a UBW of 275 lb and unsure of recent wt loss. Records indicate pt has maintained her weight over the last year.   Medications: Vitamin C 500 mg daily, decadron, SS novolog, levemir, tradjenta, MVI with minerals, 220 mg zinc sulfate  Labs: Na 125 (L) CBG 148-195  Diet Order:   Diet Order            Diet heart healthy/carb modified Room service appropriate? Yes; Fluid consistency: Thin  Diet effective now                 EDUCATION NEEDS:   Education needs have been addressed  Skin:  Skin Assessment: Reviewed RN Assessment  Last BM:  8/13  Height:   Ht Readings from Last 1 Encounters:  05/12/20 5\' 4"  (1.626 m)    Weight:   Wt Readings from Last 1 Encounters:  05/12/20 (!) 169.8 kg    BMI:  Body mass index is 64.26 kg/m.  Estimated Nutritional Needs:   Kcal:  1900-2100 kcal  Protein:  95-105 grams  Fluid:  >/= 1.9 L/day   05/14/20 RD,  LDN Clinical Nutrition Pager listed in AMION

## 2020-05-14 NOTE — Plan of Care (Signed)
°  Problem: Clinical Measurements: Goal: Cardiovascular complication will be avoided Outcome: Progressing   Problem: Clinical Measurements: Goal: Respiratory complications will improve Outcome: Progressing   Problem: Coping: Goal: Level of anxiety will decrease Outcome: Progressing   Problem: Elimination: Goal: Will not experience complications related to bowel motility Outcome: Progressing   Problem: Elimination: Goal: Will not experience complications related to urinary retention Outcome: Progressing

## 2020-05-14 NOTE — Evaluation (Signed)
Physical Therapy Evaluation Patient Details Name: Amy Barnes MRN: [id number] DOB: [birth year] Today's Date: 05/14/2020   History of Present Illness  Amy Barnes  is a 69 y.o. Caucasian female with a known history of coronary artery disease, type 2 diabetes mellitus, hypertension, dyslipidemia, hypothyroidism and obstructive sleep apnea as well as recent COVID-19 for which she was admitted here and discharged 5 days ago on room air, who presents to the emergency room with acute onset of worsening dyspnea with hypoxemia.  Her pulse currently was 83% on room air.    Clinical Impression  Pt admitted with/for COVID with worsening dyspnea and hypoxemia.  Pt at a min guard level of basic mobility and short distance gait with the RW.  Pt currently limited functionally due to the problems listed below.  (see problems list.)  Pt will benefit from PT to maximize function and safety to be able to get home safely with available assist.     Follow Up Recommendations Home health PT;Other (comment) (working toward no follow up)    Equipment Recommendations  None recommended by PT    Recommendations for Other Services       Precautions / Restrictions Precautions Precautions: Fall;Other (comment) Precaution Comments: COVID+      Mobility  Bed Mobility Overal bed mobility: Needs Assistance Bed Mobility: Supine to Sit     Supine to sit: Min guard;HOB elevated     General bed mobility comments: MIn A to advance trunk to EOB  Transfers Overall transfer level: Needs assistance Equipment used: Rolling walker (2 wheeled) Transfers: Sit to/from [employer] Sit to Stand: Min guard Stand pivot transfers: Min guard       General transfer comment: min guard to ensure safety and steadiness with RW  Ambulation/Gait Ambulation/Gait assistance: Min guard Gait Distance (Feet): 15 Feet (x2 to/from the bathroom) Assistive device: Rolling walker (2 wheeled) (bariatric) Gait  Pattern/deviations: Step-through pattern   Gait velocity interpretation: <1.31 ft/sec, indicative of household ambulator General Gait Details: steady and slower  sats on 5L dropped to 83%, slow to return with coaching.  On 8L sats dropped to 86% and quicker to return.  Pt is a mouth breather and needs coaching.  Stairs            Wheelchair Mobility    Modified Rankin (Stroke Patients Only)       Balance Overall balance assessment: Needs assistance;History of Falls Sitting-balance support: No upper extremity supported;Feet supported Sitting balance-Leahy Scale: Fair     Standing balance support: Bilateral upper extremity supported;During functional activity;Single extremity supported Standing balance-Leahy Scale: Poor Standing balance comment: reliant on external support with RW, self wiping after stool                             Pertinent Vitals/Pain Pain Assessment: 0-10 Pain Score: 2  Pain Location: general Pain Intervention(s): Monitored during session    Home Living Family/patient expects to be discharged to:: Private residence Living Arrangements: Children Available Help at Discharge: Family;Friend(s) Type of Home: House Home Access: Ramped entrance     Home Layout: Two level Home Equipment: Environmental consultant - 2 wheels;Cane - single point;Bedside commode;Shower seat;Electric scooter;Wheelchair - Chief Financial Officer - 4 wheels      Prior Function Level of Independence: Needs assistance   Gait / Transfers Assistance Needed: Uses dual canes for indoor ambulation, Rollator for outdoor  ADL's / Homemaking Assistance Needed: requires assist for some ADL's when pain is limiting  Comments: pt reports using dual canes for ambulation PTA, as needed. Pt states that she does not always use AD in her home. Pt does not drive, per daughter in room "we take her to her appointments"     Hand Dominance   Dominant Hand: Right    Extremity/Trunk  Assessment   Upper Extremity Assessment Upper Extremity Assessment: Generalized weakness    Lower Extremity Assessment Lower Extremity Assessment: Generalized weakness (mild, but she states not too far off from baseline)    Cervical / Trunk Assessment Cervical / Trunk Assessment: Normal  Communication   Communication: No difficulties  Cognition Arousal/Alertness: Awake/alert Behavior During Therapy: WFL for tasks assessed/performed Overall Cognitive Status: Within Functional Limits for tasks assessed                                        General Comments General comments (skin integrity, edema, etc.): EHR up to 115 bpm.  pt not overtly dyspneic during gait on 8L    Exercises     Assessment/Plan    PT Assessment Patient needs continued PT services  PT Problem List Decreased strength;Decreased activity tolerance;Decreased mobility;Cardiopulmonary status limiting activity       PT Treatment Interventions Gait training;Functional mobility training;Therapeutic activities;Patient/family education    PT Goals (Current goals can be found in the Care Plan section)  Acute Rehab PT Goals Patient Stated Goal: be able to take care of myself PT Goal Formulation: With patient Time For Goal Achievement: 05/28/20 Potential to Achieve Goals: Good    Frequency Min 3X/week   Barriers to discharge        Co-evaluation               AM-PAC PT "6 Clicks" Mobility  Outcome Measure Help needed turning from your back to your side while in a flat bed without using bedrails?: A Little Help needed moving from lying on your back to sitting on the side of a flat bed without using bedrails?: A Little Help needed moving to and from a bed to a chair (including a wheelchair)?: A Little Help needed standing up from a chair using your arms (e.g., wheelchair or bedside chair)?: A Little Help needed to walk in hospital room?: A Little Help needed climbing 3-5 steps with a  railing? : A Lot 6 Click Score: 17    End of Session   Activity Tolerance: Patient tolerated treatment well Patient left: in chair;with call bell/phone within reach Nurse Communication: Mobility status PT Visit Diagnosis: Unsteadiness on feet (R26.81);Difficulty in walking, not elsewhere classified (R26.2);Muscle weakness (generalized) (M62.81)    Time: 0109-3235 PT Time Calculation (min) (ACUTE ONLY): 25 min   Charges:   PT Evaluation $PT Eval Moderate Complexity: 1 Mod PT Treatments $Gait Training: 8-22 mins        05/14/2020  Jacinto Halim., PT Acute Rehabilitation Services [phone]  (pager) [phone]  (office)  Amy Barnes Amy Barnes 05/14/2020, 6:09 PM

## 2020-05-14 NOTE — Progress Notes (Signed)
Pt arrived to 2 [facility] room 30. Received handoff report from [facility]. Pt A&O x 4. Pt has IV x 3, purewick, and is on 4L [place]. Call bell given to pt. Bed alarm on.

## 2020-05-14 NOTE — Telephone Encounter (Signed)
Spoke with pt to see how she is feeling.  States she was readmitted to [facility] due to O2 dropping to 80%.  Pt also fell and hit her head.  I relayed Dr. Timoteo Expose message.  Pt verbalizes understanding.  She is aware Dr. Reece Agar is out of the office.

## 2020-05-14 NOTE — Progress Notes (Signed)
PROGRESS NOTE                                                                                                                                                                                                             Patient Demographics:    Amy Barnes, is a 69 y.o. female, DOB - Sep 10, 1951, ZYY:[id number]  Admit date - 05/12/2020   Admitting Physician Hannah Beat, MD  Outpatient Primary MD for the patient is [name], MD  LOS - 2   Chief Complaint  Patient presents with  . Shortness of Breath       Brief Narrative    Amy Barnes  is a 69 y.o. Caucasian female with a known history of coronary artery disease, type 2 diabetes mellitus, hypertension, dyslipidemia, hypothyroidism and obstructive sleep apnea as well as recent COVID-19 for which she was admitted here and discharged 5 days ago on room air, who presents to the emergency room with acute onset of worsening dyspnea with hypoxemia.  Her pulse currently was 83% on room air.  She continues to have cough occasionally productive of clear sputum.  No fever or chills.  No nausea or vomiting or abdominal pain.  She continues to have no taste or smell.  No chest pain or palpitations.  No dysuria, oliguria or hematuria or flank pain.Marland Kitchen  Upon position to the emergency room, pulse ox was 99% on her percent nonrebreather with blood pressure of 148/63 with otherwise normal vital signs.  She was later tapered down to 5 L O2 by nasal cannula with a pulse symmetry of 97-9%.  Labs revealed hyponatremia with sodium 121 and chloride 86 with blood glucose of 166.  LDH was 447 ferritin 402 with CRP of 24.6 and lactic acid 1.2, procalcitonin less than 0.1 and CBC was unremarkable.  D-dimer was 1.15 and chest CTA is currently pending.  Fibrinogen was 1 800.  Blood culture was drawn.  Portable chest x-ray showed interval worsening of multifocal airspace opacities consistent with pneumonia. EKG showed sinus rhythm  with rate of 85 with probable left atrial enlargement and left bundle branch block.    Subjective:    Amy Barnes today denies nausea and vomiting, reports dyspnea at baseline, reports her cough is improving , she has a good appetite as discussed with staff .   Assessment  & Plan :  Active Problems:   Hypothyroidism   DM type 2 with diabetic peripheral neuropathy (HCC)   CAD (coronary artery disease)   Centrilobular emphysema (HCC)   Hyponatremia   HLD (hyperlipidemia)   COVID-19   Acute hypoxic respiratory failure due to COVID-19 pneumonia. -Patient is unvaccinated. -Patient with recent hospitalization for which she was treated with IV steroids, remdesivir, she was discharged on room air, to come back with hypoxia, she is saturating 97% on 4 L nasal cannula which is reassuring. -started on baricitinib -X-ray significant for worsening multifocal opacity. -No indications for antibiotics as procalcitonin within normal limits. -Patient with elevated CRP 24.6 (used to be 1.5 on discharge), she is back on IV steroids and Remdesivir. - continue to trend inflammatory markers.  ERP is trending down which is reassuring -He was encouraged to get out of bed to chair, and use incentive spirometry and flutter valve. -The patient will be placed on scheduled Mucinex and as needed Tussionex.  COVID-19 Labs  Recent Labs    05/12/20 2227 05/14/20 0228  DDIMER 1.15* 1.19*  FERRITIN 402* 476*  LDH 447*  --   CRP 24.6* 20.9*    Lab Results  Component Value Date   SARSCOV2NAA POSITIVE (A) 05/03/2020   SARSCOV2NAA NEGATIVE 01/21/2020   SARSCOV2NAA NEGATIVE 12/23/2019   SARSCOV2NAA NEGATIVE 11/09/2019     Hyponatremia. -Improving, she has good fluid intake per staff, so we will DC IV fluids, will give sodium chloride 2 g tablet once today  Hypertension. -Was resumed on her home medication, this morning blood pressure on the soft side, will continue with Coreg, but will DC losartan  and hydralazine.  Type II diabetes mellitus. -The patient will be placed on supplement coverage with NovoLog and will hold off Metformin.  We will start on Tradjenta  GERD. - continue with PPI.  Hypothyroidism. -Continue with Synthroid  Depression and bipolar disorder. -continue Prozac and Lamictal.  Obstructive sleep apnea. -on CPAP nightly.  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 -continue doxycycline.  Presents with fall, so CT head and left hip were checked with no acute findings    COVID-19 Labs  Recent Labs    05/12/20 2227 05/14/20 0228  DDIMER 1.15* 1.19*  FERRITIN 402* 476*  LDH 447*  --   CRP 24.6* 20.9*    Lab Results  Component Value Date   SARSCOV2NAA POSITIVE (A) 05/03/2020   SARSCOV2NAA NEGATIVE 01/21/2020   SARSCOV2NAA NEGATIVE 12/23/2019   SARSCOV2NAA NEGATIVE 11/09/2019     Code Status : Full  Family Communication  : Daughter updated by phone today.  Disposition Plan  :  Status is: Inpatient  Remains inpatient appropriate because:IV treatments appropriate due to intensity of illness or inability to take PO   Dispo: The patient is from: Home              Anticipated d/c is to: Home              Anticipated d/c date is: 3 days              Patient currently is not medically stable to d/c.       Consults  :  None  Procedures  : None  DVT Prophylaxis  :  [place] lovenox  Lab Results  Component Value Date   PLT 219 05/14/2020    Antibiotics  :    Anti-infectives (From admission, onward)   Start     Dose/Rate Route Frequency Ordered Stop   05/14/20 1000  remdesivir 100 mg in sodium  chloride 0.9 % 100 mL IVPB     Discontinue    "Followed by" Linked Group Details   100 mg 200 mL/hr over 30 Minutes Intravenous Daily 05/13/20 0015 05/18/20 0959   05/13/20 0130  doxycycline (VIBRA-TABS) tablet 100 mg     Discontinue     100 mg Oral 2 times daily 05/13/20 0113     05/13/20 0100  remdesivir 200 mg in sodium chloride 0.9% 250 mL IVPB        "Followed by" Linked Group Details   200 mg 580 mL/hr over 30 Minutes Intravenous Once 05/13/20 0015 05/13/20 0507        Objective:   Vitals:   05/14/20 0801 05/14/20 1039 05/14/20 1200 05/14/20 1208  BP: (!) 114/56  (!) 120/50   Pulse: 63  60   Resp: 20  20   Temp: 97.9 F (36.6 C)  (!) 97.1 F (36.2 C)   TempSrc:      SpO2: 98% 93% 96% 97%  Weight:      Height:        Wt Readings from Last 3 Encounters:  05/12/20 (!) 169.8 kg  05/08/20 (!) 169.8 kg  04/15/20 (!) 170.1 kg     Intake/Output Summary (Last 24 hours) at 05/14/2020 1434 Last data filed at 05/14/2020 0604 Gross per 24 hour  Intake 180 ml  Output 1000 ml  Net -820 ml     Physical Exam  Awake Alert, Oriented X 3, No new F.N deficits, Normal affect Symmetrical Chest wall movement, Good air movement bilaterally, CTAB RRR,No Gallops,Rubs or new Murmurs, No Parasternal Heave +ve B.Sounds, Abd Soft, No tenderness, No rebound - guarding or rigidity. No Cyanosis, Clubbing , has trace edema, No new Rash or bruise       Data Review:    CBC Recent Labs  Lab 05/08/20 0845 05/12/20 2227 05/14/20 0228  WBC 6.0 8.4 5.0  HGB 11.2* 12.2 10.5*  HCT 35.8* 37.5 32.0*  PLT 192 235 219  MCV 85.9 83.0 83.1  MCH 26.9 27.0 27.3  MCHC 31.3 32.5 32.8  RDW 16.2* 15.9* 15.5  LYMPHSABS 0.6* 0.5* 0.6*  MONOABS 0.3 1.0 0.8  EOSABS 0.0 0.0 0.0  BASOSABS 0.0 0.0 0.0    Chemistries  Recent Labs  Lab 05/08/20 0845 05/12/20 2227 05/13/20 1612 05/14/20 0228  NA 135 121* 123* 125*  K 4.4 4.6 4.6 4.3  CL 95* 86* 89* 90*  CO2 27 23 25 25   GLUCOSE 204* 166* 189* 141*  BUN 14 6* 8 16  CREATININE 0.56 0.61 0.52 0.74  CALCIUM 9.2 8.8* 8.3* 8.4*  MG 2.2  --   --   --   AST 29 43*  --  38  ALT 51* 37  --  36  ALKPHOS 67 73  --  64  BILITOT 0.4 1.1  --  0.9   ------------------------------------------------------------------------------------------------------------------ Recent Labs    05/12/20 2227    TRIG 35    Lab Results  Component Value Date   HGBA1C 7.3 (H) 05/05/2020   ------------------------------------------------------------------------------------------------------------------ Recent Labs    05/13/20 0326  TSH 0.873   ------------------------------------------------------------------------------------------------------------------ Recent Labs    05/12/20 2227 05/14/20 0228  FERRITIN 402* 476*    Coagulation profile No results for input(s): INR, PROTIME in the last 168 hours.  Recent Labs    05/12/20 2227 05/14/20 0228  DDIMER 1.15* 1.19*    Cardiac Enzymes No results for input(s): CKMB, TROPONINI, MYOGLOBIN in the last 168 hours.  Invalid input(s):  CK ------------------------------------------------------------------------------------------------------------------    Component Value Date/Time   BNP 90.5 05/14/2020 0228    Inpatient Medications  Scheduled Meds: . ascorbic acid  500 mg Oral Daily  . aspirin EC  81 mg Oral QHS  . baricitinib  4 mg Oral Daily  . carvedilol  6.25 mg Oral BID WC  . cholecalciferol  1,000 Units Oral Daily  . dexamethasone (DECADRON) injection  6 mg Intravenous Daily  . dicyclomine  20 mg Oral BID  . doxycycline  100 mg Oral BID  . enoxaparin (LOVENOX) injection  85 mg Subcutaneous Q24H  . feeding supplement (ENSURE ENLIVE)  237 mL Oral BID BM  . guaiFENesin  600 mg Oral BID  . hydrALAZINE  10 mg Oral Q6H  . insulin aspart  0-9 Units Subcutaneous TID PC & HS  . insulin detemir  0.075 Units/kg Subcutaneous BID  . lamoTRIgine  200 mg Oral Daily  . levothyroxine  150 mcg Oral QHS  . linagliptin  5 mg Oral Daily  . losartan  50 mg Oral Daily  . montelukast  10 mg Oral QHS  . multivitamin with minerals  1 tablet Oral Daily  . OXcarbazepine  300 mg Oral Daily   And  . OXcarbazepine  600 mg Oral QHS  . pantoprazole  40 mg Oral Daily  . simvastatin  40 mg Oral QPM  . zinc sulfate  220 mg Oral Daily   Continuous  Infusions: . remdesivir 100 mg in NS 100 mL 100 mg (05/14/20 1018)   PRN Meds:.acetaminophen, albuterol, guaiFENesin-dextromethorphan, magnesium hydroxide, polyethylene glycol, prochlorperazine, traZODone, zolpidem  Micro Results Recent Results (from the past 240 hour(s))  Culture, Urine     Status: Abnormal   Collection Time: 05/05/20  2:47 PM   Specimen: Urine, Clean Catch  Result Value Ref Range Status   Specimen Description URINE, CLEAN CATCH  Final   Special Requests   Final    NONE Performed at [facility], [address] [place], [place] [address]    Culture >=100,000 COLONIES/mL PROTEUS MIRABILIS (A)  Final   Report Status 05/07/2020 FINAL  Final   Organism ID, Bacteria PROTEUS MIRABILIS (A)  Final      Susceptibility   Proteus mirabilis - MIC*    AMPICILLIN <=2 SENSITIVE Sensitive     CEFAZOLIN <=4 SENSITIVE Sensitive     CEFTRIAXONE <=0.25 SENSITIVE Sensitive     CIPROFLOXACIN <=0.25 SENSITIVE Sensitive     GENTAMICIN <=1 SENSITIVE Sensitive     IMIPENEM 4 SENSITIVE Sensitive     NITROFURANTOIN 128 RESISTANT Resistant     TRIMETH/SULFA <=20 SENSITIVE Sensitive     AMPICILLIN/SULBACTAM <=2 SENSITIVE Sensitive     * >=100,000 COLONIES/mL PROTEUS MIRABILIS  Blood Culture (routine x 2)     Status: None (Preliminary result)   Collection Time: 05/12/20 10:33 PM   Specimen: BLOOD  Result Value Ref Range Status   Specimen Description BLOOD RIGHT FOREARM  Final   Special Requests   Final    BOTTLES DRAWN AEROBIC AND ANAEROBIC Blood Culture adequate volume   Culture   Final    NO GROWTH 2 DAYS Performed at [facility], [address], [place], [place] [address]    Report Status PENDING  Incomplete    Radiology Reports CT HEAD WO CONTRAST  Result Date: 05/13/2020 CLINICAL DATA:  Fall with facial trauma. EXAM: CT HEAD WITHOUT CONTRAST TECHNIQUE: Contiguous axial images were obtained from the base of the skull through the vertex without  intravenous  contrast. COMPARISON:  05/26/2011 FINDINGS: Brain: Ventricles, cisterns and other CSF spaces are normal. There is no mass, mass effect, shift of midline structures or acute hemorrhage. No evidence of acute infarction. Vascular: No hyperdense vessel or unexpected calcification. Skull: Normal. Negative for fracture or focal lesion. Sinuses/Orbits: No acute finding. Other: None. IMPRESSION: No acute findings. Electronically Signed   By: Elberta Fortis M.D.   On: 05/13/2020 11:30   DG Chest Port 1 View  Result Date: 05/12/2020 CLINICAL DATA:  COVID positive shortness of breath EXAM: PORTABLE CHEST 1 VIEW COMPARISON:  May 03, 2020 FINDINGS: The heart size and mediastinal contours are unchanged with mild cardiomegaly. Aortic knob calcifications. Interval significant worsening in the multifocal patchy hazy airspace opacities throughout both lungs, right worse than left. No pleural effusion. No acute osseous abnormality. Cervical fixation hardware seen. IMPRESSION: Interval worsening in the multifocal airspace opacities consistent with pneumonia. Electronically Signed   By: Jonna Clark M.D.   On: 05/12/2020 22:26   DG Chest Port 1 View  Result Date: 05/03/2020 CLINICAL DATA:  Shortness of breath.  Hypoxia.  COVID-19 positive. EXAM: PORTABLE CHEST 1 VIEW COMPARISON:  Chest radiograph 10/31/2019 and CTA 11/09/2019 FINDINGS: The cardiac silhouette remains mildly enlarged. Aortic atherosclerosis is noted. There is persistent mild elevation of the right hemidiaphragm. Compared to the prior radiograph, there is increased pulmonary vascular congestion with mild interstitial densities bilaterally. No sizable pleural effusion or pneumothorax is identified. Prior cervical fusion is noted. IMPRESSION: Cardiomegaly with pulmonary vascular congestion and mild bilateral interstitial opacities which may reflect edema or atypical/viral infection. Electronically Signed   By: Sebastian Ache M.D.   On: 05/03/2020 15:42   DG HIP  UNILAT WITH PELVIS 2-3 VIEWS LEFT  Result Date: 05/13/2020 CLINICAL DATA:  Fall. EXAM: DG HIP (WITH OR WITHOUT PELVIS) 2-3V LEFT COMPARISON:  10/20/2016 FINDINGS: Left hip arthroplasty intact and unchanged. Moderate osteoarthritic change of the right hip. Stable irregularity over the inferior left acetabular region. Degenerate change of the spine with partially visualized fusion hardware intact over the lower lumbar spine. Mild degenerate change of the sacroiliac joints. IMPRESSION: 1. No acute findings. 2. Moderate osteoarthritic change of the right hip. Left hip arthroplasty intact. Electronically Signed   By: Elberta Fortis M.D.   On: 05/13/2020 11:28   SLEEP STUDY DOCUMENTS  Result Date: 04/25/2020 Ordered by an unspecified provider.  SLEEP STUDY DOCUMENTS  Result Date: 04/18/2020 Ordered by an unspecified provider.     Huey Bienenstock M.D on 05/14/2020 at 2:34 PM    [facility] -  Office  [phone]

## 2020-05-14 NOTE — Evaluation (Signed)
Occupational Therapy Evaluation Patient Details Name: Amy Barnes MRN: [id number] DOB: [birth year] Today's Date: 05/14/2020    History of Present Illness Amy Barnes  is a 69 y.o. Caucasian female with a known history of coronary artery disease, type 2 diabetes mellitus, hypertension, dyslipidemia, hypothyroidism and obstructive sleep apnea as well as recent COVID-19 for which she was admitted here and discharged 5 days ago on room air, who presents to the emergency room with acute onset of worsening dyspnea with hypoxemia.  Her pulse currently was 83% on room air.     Clinical Impression   PTA, pt lives with family (reports her entire family is COVID+) and typically able to complete ADLs and short distance mobility in the home using cane or RW. Family assists with ADLs prn given pt's pain and/or fatigue level. Pt presents now with deficits in strength, cardiopulmonary tolerance, and dynamic standing balance. Pt requires up to Mod A for LB ADLs due to these deficits and body habitus. Pt required Min A for bed mobility and min guard for simple transfers with RW. Pt received on 4 LO2 and desats to 84% with these activities, requiring 1.5 min seated rest break and cues for PLB to recover to upper 80s. Pt reports did not wear O2 at baseline.    Follow Up Recommendations  Home health OT;Supervision/Assistance - 24 hour    Equipment Recommendations  None recommended by OT (has all needed DME)    Recommendations for Other Services       Precautions / Restrictions Precautions Precautions: Fall;Other (comment) Precaution Comments: COVID+ Restrictions Weight Bearing Restrictions: No      Mobility Bed Mobility Overal bed mobility: Needs Assistance Bed Mobility: Supine to Sit     Supine to sit: Min assist     General bed mobility comments: MIn A to advance trunk to EOB  Transfers Overall transfer level: Needs assistance Equipment used: Rolling walker (2 wheeled) Transfers: Sit to/from  [employer] Sit to Stand: Min guard Stand pivot transfers: Min guard       General transfer comment: min guard to ensure safety and steadiness with RW    Balance Overall balance assessment: Needs assistance;History of Falls Sitting-balance support: No upper extremity supported;Feet supported Sitting balance-Leahy Scale: Fair     Standing balance support: Bilateral upper extremity supported;During functional activity Standing balance-Leahy Scale: Poor Standing balance comment: reliant on external support with RW                           ADL either performed or assessed with clinical judgement   ADL Overall ADL's : Needs assistance/impaired Eating/Feeding: Set up;Sitting   Grooming: Set up;Sitting   Upper Body Bathing: Minimal assistance;Sitting   Lower Body Bathing: Moderate assistance;Sit to/from stand   Upper Body Dressing : Minimal assistance;Sitting   Lower Body Dressing: Moderate assistance;Sit to/from stand Lower Body Dressing Details (indicate cue type and reason): Max A to don socks, will require assistance to don clothing around feet and maintain balance standing  Toilet Transfer: Min Probation officer Details (indicate cue type and reason): simulated to recliner Toileting- Clothing Manipulation and Hygiene: Moderate assistance;Sit to/from stand         General ADL Comments: Pt with generalized weakness and decreased cardiopulmonary tolerance impacting ability to complete ADLs/mobility without physical assistance     Vision Baseline Vision/History: No visual deficits Patient Visual Report: No change from baseline Vision Assessment?: No apparent visual deficits  Perception     Praxis      Pertinent Vitals/Pain Pain Assessment: 0-10 Pain Score: 5  Pain Location: headache Pain Descriptors / Indicators: Headache Pain Intervention(s): Monitored during session;Patient requesting pain meds-RN notified      Hand Dominance Right   Extremity/Trunk Assessment Upper Extremity Assessment Upper Extremity Assessment: Generalized weakness   Lower Extremity Assessment Lower Extremity Assessment: Defer to PT evaluation   Cervical / Trunk Assessment Cervical / Trunk Assessment: Normal   Communication Communication Communication: No difficulties   Cognition Arousal/Alertness: Awake/alert Behavior During Therapy: WFL for tasks assessed/performed Overall Cognitive Status: Within Functional Limits for tasks assessed                                     General Comments  Pt recieved on 4 L O2, WFL at rest, desats to 84% at lowest during transfers and talking. Cues for PLB and 1.5 min to recover to high 80s/90 with seated rest break. Pt reports her entire family is COVID positive    Exercises     Shoulder Instructions      Home Living Family/patient expects to be discharged to:: Private residence Living Arrangements: Children Available Help at Discharge: Family;Friend(s) Type of Home: House Home Access: Ramped entrance     Home Layout: Two level     Bathroom Shower/Tub: Producer, television/film/video: Handicapped height Bathroom Accessibility: Yes How Accessible: Accessible via walker Home Equipment: Walker - 2 wheels;Cane - single point;Bedside commode;Shower seat;Electric scooter;Wheelchair - Chief Financial Officer - 4 wheels Adaptive Equipment: Reacher        Prior Functioning/Environment Level of Independence: Needs assistance  Gait / Transfers Assistance Needed: Uses dual canes for indoor ambulation, Rollator for outdoor ADL's / Homemaking Assistance Needed: requires assist for some ADL's when pain is limiting   Comments: pt reports using dual canes for ambulation PTA, as needed. Pt states that she does not always use AD in her home. Pt does not drive, per daughter in room "we take her to her appointments"        OT Problem List: Decreased  strength;Decreased activity tolerance;Impaired balance (sitting and/or standing);Cardiopulmonary status limiting activity      OT Treatment/Interventions: Self-care/ADL training;Therapeutic exercise;Energy conservation;DME and/or AE instruction;Therapeutic activities;Patient/family education    OT Goals(Current goals can be found in the care plan section) Acute Rehab OT Goals Patient Stated Goal: be able to take care of myself OT Goal Formulation: With patient Time For Goal Achievement: 05/28/20 Potential to Achieve Goals: Good ADL Goals Pt Will Perform Grooming: with supervision;standing Pt Will Perform Lower Body Bathing: with min guard assist;sit to/from stand Pt Will Transfer to Toilet: with supervision;ambulating;bedside commode Pt Will Perform Toileting - Clothing Manipulation and hygiene: sit to/from stand;with min guard assist Additional ADL Goal #1: Pt to verbalize at least 3 energy conservation strategies in order to maximize ADL independence  OT Frequency: Min 2X/week   Barriers to D/C:            Co-evaluation              AM-PAC OT "6 Clicks" Daily Activity     Outcome Measure Help from another person eating meals?: A Little Help from another person taking care of personal grooming?: A Little Help from another person toileting, which includes using toliet, bedpan, or urinal?: A Lot Help from another person bathing (including washing, rinsing, drying)?: A Lot Help from another person  to put on and taking off regular upper body clothing?: A Little Help from another person to put on and taking off regular lower body clothing?: A Lot 6 Click Score: 15   End of Session Equipment Utilized During Treatment: Rolling walker;Oxygen Nurse Communication: Mobility status;Patient requests pain meds  Activity Tolerance: Patient tolerated treatment well Patient left: in chair;with call bell/phone within reach  OT Visit Diagnosis: Unsteadiness on feet (R26.81);Other  abnormalities of gait and mobility (R26.89);Muscle weakness (generalized) (M62.81);History of falling (Z91.81)                Time: 0930-1002 OT Time Calculation (min): 32 min Charges:  OT General Charges $OT Visit: 1 Visit OT Evaluation $OT Eval Moderate Complexity: 1 Mod OT Treatments $Therapeutic Activity: 8-22 mins  Lorre Munroe, OTR/L  Lorre Munroe 05/14/2020, 10:11 AM

## 2020-05-15 DIAGNOSIS — E1142 Type 2 diabetes mellitus with diabetic polyneuropathy: Secondary | ICD-10-CM | POA: Diagnosis not present

## 2020-05-15 DIAGNOSIS — J9601 Acute respiratory failure with hypoxia: Secondary | ICD-10-CM | POA: Diagnosis not present

## 2020-05-15 DIAGNOSIS — E039 Hypothyroidism, unspecified: Secondary | ICD-10-CM | POA: Diagnosis not present

## 2020-05-15 DIAGNOSIS — U071 COVID-19: Secondary | ICD-10-CM | POA: Diagnosis not present

## 2020-05-15 LAB — GLUCOSE, CAPILLARY
Glucose-Capillary: 117 mg/dL — ABNORMAL HIGH (ref 70–99)
Glucose-Capillary: 183 mg/dL — ABNORMAL HIGH (ref 70–99)
Glucose-Capillary: 173 mg/dL — ABNORMAL HIGH (ref 70–99)
Glucose-Capillary: 193 mg/dL — ABNORMAL HIGH (ref 70–99)

## 2020-05-15 LAB — CREATININE, URINE, RANDOM: Creatinine, Urine: 106.88 mg/dL

## 2020-05-15 LAB — COMPREHENSIVE METABOLIC PANEL
ALT: 47 U/L — ABNORMAL HIGH (ref 0–44)
AST: 50 U/L — ABNORMAL HIGH (ref 15–41)
Albumin: 2.6 g/dL — ABNORMAL LOW (ref 3.5–5.0)
Alkaline Phosphatase: 64 U/L (ref 38–126)
Anion gap: 8 (ref 5–15)
BUN: 26 mg/dL — ABNORMAL HIGH (ref 8–23)
CO2: 25 mmol/L (ref 22–32)
Calcium: 9.1 mg/dL (ref 8.9–10.3)
Chloride: 92 mmol/L — ABNORMAL LOW (ref 98–111)
Creatinine, Ser: 0.72 mg/dL (ref 0.44–1.00)
GFR calc Af Amer: >60 mL/min (ref >60)
GFR calc non Af Amer: >60 mL/min (ref >60)
Glucose, Bld: 199 mg/dL — ABNORMAL HIGH (ref 70–99)
Potassium: 4.8 mmol/L (ref 3.5–5.1)
Sodium: 125 mmol/L — ABNORMAL LOW (ref 135–145)
Total Bilirubin: 0.3 mg/dL (ref 0.3–1.2)
Total Protein: 5.8 g/dL — ABNORMAL LOW (ref 6.5–8.1)

## 2020-05-15 LAB — CBC WITH DIFFERENTIAL/PLATELET
Abs Immature Granulocytes: 0.04 10*3/uL (ref 0.00–0.07)
Basophils Absolute: 0 10*3/uL (ref 0.0–0.1)
Basophils Relative: 0 %
Eosinophils Absolute: 0 10*3/uL (ref 0.0–0.5)
Eosinophils Relative: 0 %
HCT: 32.6 % — ABNORMAL LOW (ref 36.0–46.0)
Hemoglobin: 10.5 g/dL — ABNORMAL LOW (ref 12.0–15.0)
Immature Granulocytes: 1 %
Lymphocytes Relative: 12 %
Lymphs Abs: 0.7 10*3/uL (ref 0.7–4.0)
MCH: 26.9 pg (ref 26.0–34.0)
MCHC: 32.2 g/dL (ref 30.0–36.0)
MCV: 83.4 fL (ref 80.0–100.0)
Monocytes Absolute: 0.6 10*3/uL (ref 0.1–1.0)
Monocytes Relative: 10 %
Neutro Abs: 4.8 10*3/uL (ref 1.7–7.7)
Neutrophils Relative %: 77 %
Platelets: 284 10*3/uL (ref 150–400)
RBC: 3.91 MIL/uL (ref 3.87–5.11)
RDW: 15.8 % — ABNORMAL HIGH (ref 11.5–15.5)
WBC: 6.2 10*3/uL (ref 4.0–10.5)
nRBC: 0 % (ref 0.0–0.2)

## 2020-05-15 LAB — C-REACTIVE PROTEIN: CRP: 13.4 mg/dL — ABNORMAL HIGH (ref <1.0)

## 2020-05-15 LAB — SODIUM, URINE, RANDOM: Sodium, Ur: <10 mmol/L

## 2020-05-15 LAB — FERRITIN: Ferritin: 333 ng/mL — ABNORMAL HIGH (ref 11–307)

## 2020-05-15 LAB — URIC ACID: Uric Acid, Serum: 4.3 mg/dL (ref 2.5–7.1)

## 2020-05-15 LAB — D-DIMER, QUANTITATIVE: D-Dimer, Quant: 0.89 ug/mL-FEU — ABNORMAL HIGH (ref 0.00–0.50)

## 2020-05-15 MED ORDER — INSULIN DETEMIR 100 UNIT/ML ~~LOC~~ SOLN
15.0000 [IU] | Freq: Two times a day (BID) | SUBCUTANEOUS | Status: DC
  Administered 2020-05-15 – 2020-05-16 (×2): 15 [IU] via SUBCUTANEOUS
  Filled 2020-05-15 (×3): qty 0.15

## 2020-05-15 MED ORDER — SODIUM CHLORIDE 0.9 % IV SOLN: INTRAVENOUS | Status: DC

## 2020-05-15 NOTE — Plan of Care (Signed)

## 2020-05-15 NOTE — Consult Note (Signed)
[facility] Inpatient Consult   05/15/2020  KYNSLEIGH MIU 1951-02-14 [id number]  [facility] [[facility]]  Accountable Care Organization [ACO] Patient:  Medicare [employer]  Patient is currently active with [facility] [[facility]] Care Management for chronic disease management services.  Patient has been engaged by a [facility].  Our community based plan of care has focused on disease management and community resource support. Patient readmitted in less than 7 days with COVID-19 acute hypoxemic respiratory failure.   Plan: Following patient for progress and disposition need with the Inpatient Transition Of Care [TOC] team member and  to make aware that [facility] Care Management following.   Of note, [facility] Care Management services does not replace or interfere with any services that are needed or arranged by inpatient [facility] care management team.    For additional questions or referrals please contact:  Charlesetta Shanks, RN BSN CCM [facility]  [phone] business mobile phone Toll free office [phone]  Fax number: [phone] [email or web address]

## 2020-05-15 NOTE — Progress Notes (Signed)
PROGRESS NOTE                                                                                                                                                                                                             Patient Demographics:    Amy Barnes, is a 69 y.o. female, DOB - 04-27-1951, IOX:[id number]  Admit date - 05/12/2020   Admitting Physician Hannah Beat, MD  Outpatient Primary MD for the patient is [name], MD  LOS - 3   Chief Complaint  Patient presents with  . Shortness of Breath       Brief Narrative    Amy Barnes  is a 69 y.o. Caucasian female with a known history of coronary artery disease, type 2 diabetes mellitus, hypertension, dyslipidemia, hypothyroidism and obstructive sleep apnea as well as recent COVID-19 for which she was admitted here and discharged 5 days ago on room air, who presents to the emergency room with acute onset of worsening dyspnea with hypoxemia.  Her pulse currently was 83% on room air.  She continues to have cough occasionally productive of clear sputum.  No fever or chills.  No nausea or vomiting or abdominal pain.  She continues to have no taste or smell.  No chest pain or palpitations.  No dysuria, oliguria or hematuria or flank pain.Marland Kitchen  Upon position to the emergency room, pulse ox was 99% on her percent nonrebreather with blood pressure of 148/63 with otherwise normal vital signs.  She was later tapered down to 5 L O2 by nasal cannula with a pulse symmetry of 97-9%.  Labs revealed hyponatremia with sodium 121 and chloride 86 with blood glucose of 166.  LDH was 447 ferritin 402 with CRP of 24.6 and lactic acid 1.2, procalcitonin less than 0.1 and CBC was unremarkable.  D-dimer was 1.15 and chest CTA is currently pending.  Fibrinogen was 1 800.  Blood culture was drawn.  Portable chest x-ray showed interval worsening of multifocal airspace opacities consistent with pneumonia. EKG showed sinus rhythm  with rate of 85 with probable left atrial enlargement and left bundle branch block.    Subjective:    Amy Barnes today denies any nausea or vomiting, reports good appetite, reports mild cough, denies any worsening dyspnea    Assessment  & Plan :    Active Problems:  Hypothyroidism   DM type 2 with diabetic peripheral neuropathy (HCC)   CAD (coronary artery disease)   Centrilobular emphysema (HCC)   Hyponatremia   HLD (hyperlipidemia)   COVID-19   Acute hypoxic respiratory failure due to COVID-19 pneumonia. -Patient is unvaccinated. -Patient with recent hospitalization for which she was treated with IV steroids, remdesivir, she was discharged on room air, to come back with hypoxia, requiring 3 L nasal cannula at rest this morning, but this needs to go up to 8 L with activity. -started on baricitinib -X-ray significant for worsening multifocal opacity. -No indications for antibiotics as procalcitonin within normal limits. -Patient with elevated CRP 24.6 (used to be 1.5 on discharge), she is back on IV steroids and Remdesivir. - continue to trend inflammatory markers.  ERP is trending down which is reassuring -She was encouraged to get out of bed to chair, and use incentive spirometry and flutter valve. -The patient will be placed on scheduled Mucinex and as needed Tussionex. -I will check BNP tomorrow, will hold on giving Lasix given her hyponatremia and low FENa,  COVID-19 Labs  Recent Labs    05/12/20 2227 05/14/20 0228 05/15/20 0108  DDIMER 1.15* 1.19* 0.89*  FERRITIN 402* 476* 333*  LDH 447*  --   --   CRP 24.6* 20.9* 13.4*    Lab Results  Component Value Date   SARSCOV2NAA POSITIVE (A) 05/03/2020   SARSCOV2NAA NEGATIVE 01/21/2020   SARSCOV2NAA NEGATIVE 12/23/2019   SARSCOV2NAA NEGATIVE 11/09/2019     Hyponatremia. - pre renal with FENa 0.1% -Continue with IV fluids  Hypertension. -Losartan and hydralazine on hold given soft blood pressure, continue  with Coreg for now .  Type II diabetes mellitus. -In with insulin sliding scale, continue with Tradjenta, increase Levemir to 15 units twice daily.  Metformin on hold.    GERD. - continue with PPI.  Hypothyroidism. -Continue with Synthroid  Depression and bipolar disorder. -continue Prozac and Lamictal.  Obstructive sleep apnea. -on CPAP nightly.  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 -continue doxycycline.  Presents with fall, so CT head and left hip were checked with no acute findings    COVID-19 Labs  Recent Labs    05/12/20 2227 05/14/20 0228 05/15/20 0108  DDIMER 1.15* 1.19* 0.89*  FERRITIN 402* 476* 333*  LDH 447*  --   --   CRP 24.6* 20.9* 13.4*    Lab Results  Component Value Date   SARSCOV2NAA POSITIVE (A) 05/03/2020   SARSCOV2NAA NEGATIVE 01/21/2020   SARSCOV2NAA NEGATIVE 12/23/2019   SARSCOV2NAA NEGATIVE 11/09/2019     Code Status : Full  Family Communication  : Daughter updated by phone daily.  Disposition Plan  :  Status is: Inpatient  Remains inpatient appropriate because:IV treatments appropriate due to intensity of illness or inability to take PO   Dispo: The patient is from: Home              Anticipated d/c is to: Home              Anticipated d/c date is: 2 days              Patient currently is not medically stable to d/c.       Consults  :  None  Procedures  : None  DVT Prophylaxis  :  [place] lovenox  Lab Results  Component Value Date   PLT 284 05/15/2020    Antibiotics  :    Anti-infectives (From admission, onward)   Start     Dose/Rate  Route Frequency Ordered Stop   05/14/20 1000  remdesivir 100 mg in sodium chloride 0.9 % 100 mL IVPB     Discontinue    "Followed by" Linked Group Details   100 mg 200 mL/hr over 30 Minutes Intravenous Daily 05/13/20 0015 05/18/20 0959   05/13/20 0130  doxycycline (VIBRA-TABS) tablet 100 mg     Discontinue     100 mg Oral 2 times daily 05/13/20 0113     05/13/20 0100  remdesivir  200 mg in sodium chloride 0.9% 250 mL IVPB       "Followed by" Linked Group Details   200 mg 580 mL/hr over 30 Minutes Intravenous Once 05/13/20 0015 05/13/20 0507        Objective:   Vitals:   05/14/20 2252 05/15/20 0718 05/15/20 0833 05/15/20 1038  BP: 121/70 (!) 158/62    Pulse: 66 66    Resp: 17 20    Temp: 98.1 F (36.7 C) 98 F (36.7 C)    TempSrc:      SpO2: 94% 96% 94% (!) 85%  Weight:      Height:        Wt Readings from Last 3 Encounters:  05/12/20 (!) 169.8 kg  05/08/20 (!) 169.8 kg  04/15/20 (!) 170.1 kg     Intake/Output Summary (Last 24 hours) at 05/15/2020 1536 Last data filed at 05/15/2020 0545 Gross per 24 hour  Intake 480 ml  Output 800 ml  Net -320 ml     Physical Exam  Awake Alert, Oriented X 3, No new F.N deficits, Normal affect Symmetrical Chest wall movement, Good air movement bilaterally, CTAB RRR,No Gallops,Rubs or new Murmurs, No Parasternal Heave +ve B.Sounds, Abd Soft, No tenderness, No rebound - guarding or rigidity. No Cyanosis, Clubbing or edema, No new Rash or bruise      Data Review:    CBC Recent Labs  Lab 05/12/20 2227 05/14/20 0228 05/15/20 0108  WBC 8.4 5.0 6.2  HGB 12.2 10.5* 10.5*  HCT 37.5 32.0* 32.6*  PLT 235 219 284  MCV 83.0 83.1 83.4  MCH 27.0 27.3 26.9  MCHC 32.5 32.8 32.2  RDW 15.9* 15.5 15.8*  LYMPHSABS 0.5* 0.6* 0.7  MONOABS 1.0 0.8 0.6  EOSABS 0.0 0.0 0.0  BASOSABS 0.0 0.0 0.0    Chemistries  Recent Labs  Lab 05/12/20 2227 05/13/20 1612 05/14/20 0228 05/15/20 0108  NA 121* 123* 125* 125*  K 4.6 4.6 4.3 4.8  CL 86* 89* 90* 92*  CO2 GLUCOSE 166* 189* 141* 199*  BUN 6* 8 16 26*  CREATININE 0.61 0.52 0.74 0.72  CALCIUM 8.8* 8.3* 8.4* 9.1  AST 43*  --  38 50*  ALT 37  --  36 47*  ALKPHOS 73  --  64 64  BILITOT 1.1  --  0.9 0.3   ------------------------------------------------------------------------------------------------------------------ Recent Labs    05/12/20 2227   TRIG 35    Lab Results  Component Value Date   HGBA1C 7.3 (H) 05/05/2020   ------------------------------------------------------------------------------------------------------------------ Recent Labs    05/13/20 0326  TSH 0.873   ------------------------------------------------------------------------------------------------------------------ Recent Labs    05/14/20 0228 05/15/20 0108  FERRITIN 476* 333*    Coagulation profile No results for input(s): INR, PROTIME in the last 168 hours.  Recent Labs    05/14/20 0228 05/15/20 0108  DDIMER 1.19* 0.89*    Cardiac Enzymes No results for input(s): CKMB, TROPONINI, MYOGLOBIN in the last 168 hours.  Invalid input(s): CK ------------------------------------------------------------------------------------------------------------------  Component Value Date/Time   BNP 90.5 05/14/2020 0228    Inpatient Medications  Scheduled Meds: . ascorbic acid  500 mg Oral Daily  . aspirin EC  81 mg Oral QHS  . baricitinib  4 mg Oral Daily  . carvedilol  6.25 mg Oral BID WC  . cholecalciferol  1,000 Units Oral Daily  . dexamethasone (DECADRON) injection  6 mg Intravenous Daily  . dicyclomine  20 mg Oral BID  . doxycycline  100 mg Oral BID  . enoxaparin (LOVENOX) injection  85 mg Subcutaneous Q24H  . feeding supplement (ENSURE ENLIVE)  237 mL Oral BID BM  . guaiFENesin  600 mg Oral BID  . insulin aspart  0-9 Units Subcutaneous TID PC & HS  . insulin detemir  0.075 Units/kg Subcutaneous BID  . lamoTRIgine  200 mg Oral Daily  . levothyroxine  150 mcg Oral QHS  . linagliptin  5 mg Oral Daily  . montelukast  10 mg Oral QHS  . multivitamin with minerals  1 tablet Oral Daily  . OXcarbazepine  300 mg Oral Daily   And  . OXcarbazepine  600 mg Oral QHS  . pantoprazole  40 mg Oral Daily  . simvastatin  40 mg Oral QPM  . zinc sulfate  220 mg Oral Daily   Continuous Infusions: . remdesivir 100 mg in NS 100 mL 100 mg (05/15/20  0822)   PRN Meds:.acetaminophen, albuterol, guaiFENesin-dextromethorphan, magnesium hydroxide, polyethylene glycol, prochlorperazine, traZODone, zolpidem  Micro Results Recent Results (from the past 240 hour(s))  Blood Culture (routine x 2)     Status: None (Preliminary result)   Collection Time: 05/12/20 10:33 PM   Specimen: BLOOD  Result Value Ref Range Status   Specimen Description BLOOD RIGHT FOREARM  Final   Special Requests   Final    BOTTLES DRAWN AEROBIC AND ANAEROBIC Blood Culture adequate volume   Culture   Final    NO GROWTH 3 DAYS Performed at [facility], [address] [place], [place] [address]    Report Status PENDING  Incomplete    Radiology Reports CT HEAD WO CONTRAST  Result Date: 05/13/2020 CLINICAL DATA:  Fall with facial trauma. EXAM: CT HEAD WITHOUT CONTRAST TECHNIQUE: Contiguous axial images were obtained from the base of the skull through the vertex without intravenous contrast. COMPARISON:  05/26/2011 FINDINGS: Brain: Ventricles, cisterns and other CSF spaces are normal. There is no mass, mass effect, shift of midline structures or acute hemorrhage. No evidence of acute infarction. Vascular: No hyperdense vessel or unexpected calcification. Skull: Normal. Negative for fracture or focal lesion. Sinuses/Orbits: No acute finding. Other: None. IMPRESSION: No acute findings. Electronically Signed   By: Elberta Fortis M.D.   On: 05/13/2020 11:30   DG Chest Port 1 View  Result Date: 05/12/2020 CLINICAL DATA:  COVID positive shortness of breath EXAM: PORTABLE CHEST 1 VIEW COMPARISON:  May 03, 2020 FINDINGS: The heart size and mediastinal contours are unchanged with mild cardiomegaly. Aortic knob calcifications. Interval significant worsening in the multifocal patchy hazy airspace opacities throughout both lungs, right worse than left. No pleural effusion. No acute osseous abnormality. Cervical fixation hardware seen. IMPRESSION: Interval worsening in the  multifocal airspace opacities consistent with pneumonia. Electronically Signed   By: Jonna Clark M.D.   On: 05/12/2020 22:26   DG Chest Port 1 View  Result Date: 05/03/2020 CLINICAL DATA:  Shortness of breath.  Hypoxia.  COVID-19 positive. EXAM: PORTABLE CHEST 1 VIEW COMPARISON:  Chest radiograph 10/31/2019 and CTA  11/09/2019 FINDINGS: The cardiac silhouette remains mildly enlarged. Aortic atherosclerosis is noted. There is persistent mild elevation of the right hemidiaphragm. Compared to the prior radiograph, there is increased pulmonary vascular congestion with mild interstitial densities bilaterally. No sizable pleural effusion or pneumothorax is identified. Prior cervical fusion is noted. IMPRESSION: Cardiomegaly with pulmonary vascular congestion and mild bilateral interstitial opacities which may reflect edema or atypical/viral infection. Electronically Signed   By: Sebastian Ache M.D.   On: 05/03/2020 15:42   DG HIP UNILAT WITH PELVIS 2-3 VIEWS LEFT  Result Date: 05/13/2020 CLINICAL DATA:  Fall. EXAM: DG HIP (WITH OR WITHOUT PELVIS) 2-3V LEFT COMPARISON:  10/20/2016 FINDINGS: Left hip arthroplasty intact and unchanged. Moderate osteoarthritic change of the right hip. Stable irregularity over the inferior left acetabular region. Degenerate change of the spine with partially visualized fusion hardware intact over the lower lumbar spine. Mild degenerate change of the sacroiliac joints. IMPRESSION: 1. No acute findings. 2. Moderate osteoarthritic change of the right hip. Left hip arthroplasty intact. Electronically Signed   By: Elberta Fortis M.D.   On: 05/13/2020 11:28   SLEEP STUDY DOCUMENTS  Result Date: 04/25/2020 Ordered by an unspecified provider.  SLEEP STUDY DOCUMENTS  Result Date: 04/18/2020 Ordered by an unspecified provider.     Huey Bienenstock M.D on 05/15/2020 at 3:36 PM    [facility] -  Office  [phone]

## 2020-05-16 DIAGNOSIS — E7800 Pure hypercholesterolemia, unspecified: Secondary | ICD-10-CM

## 2020-05-16 LAB — COMPREHENSIVE METABOLIC PANEL
ALT: 46 U/L — ABNORMAL HIGH (ref 0–44)
AST: 38 U/L (ref 15–41)
Albumin: 2.8 g/dL — ABNORMAL LOW (ref 3.5–5.0)
Alkaline Phosphatase: 65 U/L (ref 38–126)
Anion gap: 8 (ref 5–15)
BUN: 19 mg/dL (ref 8–23)
CO2: 31 mmol/L (ref 22–32)
Calcium: 9.3 mg/dL (ref 8.9–10.3)
Chloride: 90 mmol/L — ABNORMAL LOW (ref 98–111)
Creatinine, Ser: 0.66 mg/dL (ref 0.44–1.00)
GFR calc Af Amer: >60 mL/min (ref >60)
GFR calc non Af Amer: >60 mL/min (ref >60)
Glucose, Bld: 77 mg/dL (ref 70–99)
Potassium: 4.9 mmol/L (ref 3.5–5.1)
Sodium: 129 mmol/L — ABNORMAL LOW (ref 135–145)
Total Bilirubin: 0.6 mg/dL (ref 0.3–1.2)
Total Protein: 6.2 g/dL — ABNORMAL LOW (ref 6.5–8.1)

## 2020-05-16 LAB — GLUCOSE, CAPILLARY
Glucose-Capillary: 73 mg/dL (ref 70–99)
Glucose-Capillary: 92 mg/dL (ref 70–99)
Glucose-Capillary: 89 mg/dL (ref 70–99)
Glucose-Capillary: 184 mg/dL — ABNORMAL HIGH (ref 70–99)
Glucose-Capillary: 152 mg/dL — ABNORMAL HIGH (ref 70–99)

## 2020-05-16 LAB — CBC WITH DIFFERENTIAL/PLATELET
Abs Immature Granulocytes: 0.04 10*3/uL (ref 0.00–0.07)
Basophils Absolute: 0 10*3/uL (ref 0.0–0.1)
Basophils Relative: 0 %
Eosinophils Absolute: 0 10*3/uL (ref 0.0–0.5)
Eosinophils Relative: 0 %
HCT: 34.2 % — ABNORMAL LOW (ref 36.0–46.0)
Hemoglobin: 11 g/dL — ABNORMAL LOW (ref 12.0–15.0)
Immature Granulocytes: 0 %
Lymphocytes Relative: 16 %
Lymphs Abs: 1.6 10*3/uL (ref 0.7–4.0)
MCH: 27.2 pg (ref 26.0–34.0)
MCHC: 32.2 g/dL (ref 30.0–36.0)
MCV: 84.7 fL (ref 80.0–100.0)
Monocytes Absolute: 1.1 10*3/uL — ABNORMAL HIGH (ref 0.1–1.0)
Monocytes Relative: 11 %
Neutro Abs: 7.1 10*3/uL (ref 1.7–7.7)
Neutrophils Relative %: 73 %
Platelets: 342 10*3/uL (ref 150–400)
RBC: 4.04 MIL/uL (ref 3.87–5.11)
RDW: 15.9 % — ABNORMAL HIGH (ref 11.5–15.5)
WBC: 9.9 10*3/uL (ref 4.0–10.5)
nRBC: 0 % (ref 0.0–0.2)

## 2020-05-16 LAB — BRAIN NATRIURETIC PEPTIDE: B Natriuretic Peptide: 139.5 pg/mL — ABNORMAL HIGH (ref 0.0–100.0)

## 2020-05-16 LAB — D-DIMER, QUANTITATIVE: D-Dimer, Quant: 1.19 ug/mL-FEU — ABNORMAL HIGH (ref 0.00–0.50)

## 2020-05-16 LAB — FERRITIN: Ferritin: 268 ng/mL (ref 11–307)

## 2020-05-16 LAB — C-REACTIVE PROTEIN: CRP: 7.3 mg/dL — ABNORMAL HIGH (ref <1.0)

## 2020-05-16 MED ORDER — INSULIN DETEMIR 100 UNIT/ML ~~LOC~~ SOLN
10.0000 [IU] | Freq: Two times a day (BID) | SUBCUTANEOUS | Status: DC
  Administered 2020-05-17 – 2020-05-22 (×12): 10 [IU] via SUBCUTANEOUS
  Filled 2020-05-16 (×13): qty 0.1

## 2020-05-16 NOTE — Telephone Encounter (Signed)
Called patients results today and was informed by the patient that she is currently admitted in the hospital.

## 2020-05-16 NOTE — Progress Notes (Signed)
PROGRESS NOTE                                                                                                                                                                                                             Patient Demographics:    Amy Barnes, is a 69 y.o. female, DOB - June 01, 1951, AVW:[id number]  Outpatient Primary MD for the patient is [name], MD   Admit date - 05/12/2020   LOS - 4  Chief Complaint  Patient presents with  . Shortness of Breath       Brief Narrative: Patient is a 69 y.o. 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  See below for further details.  COVID-19 vaccinated status: Unvaccinated  Significant Events: 8/5-8/10>> hospitalized for hypoxia secondary to COVID-19-discharged on room air. 8/14>> presented back to the ED with shortness of breath-readmitted for hypoxia secondary to COVID-19 pneumonitis  Significant studies: 8/14>>Chest x-ray: Interval worsening in the multifocal airspace opacities consistent with pneumonia 8/15>> x-ray hip: No acute findings-moderate OA changes of the right hip, left hip arthroplasty intact. 8/15>> CT head: No acute findings  COVID-19 medications: Steroids: 8/14>> Remdesivir: 8/14>>  Antibiotics: Doxycycline-resumed in the hospital-on chronically for chronic MRSA infection in back hardware.  Microbiology data: 8/14 blood culture: No growth   Procedures: None  Consults: None  DVT prophylaxis: Prophylactic Lovenox at daily dosing.    Subjective:    Gertie Baron today     Assessment  & Plan :   Acute Hypoxic Resp Failure due to Covid 19 Viral pneumonia: Slowly improving-Down to 3 L of oxygen today-continue steroids/remdesivir.  Does have some mild lower extremity edema-we will reassess tomorrow to see if patient still requires diuretics-for now continue with attempts to slowly titrate down FiO2.Follow inflammatory markers.  Suspect may require home O2 on discharge.  Fever: afebrile  O2 requirements:  SpO2: 94 % O2 Flow Rate (L/min): 3 L/min   COVID-19 Labs: Recent Labs    05/14/20 0228 05/15/20 0108 05/16/20 0715  DDIMER 1.19* 0.89* 1.19*  FERRITIN 476* 333* 268  CRP 20.9* 13.4* 7.3*       Component Value Date/Time   BNP 139.5 (H) 05/16/2020 0715    Recent Labs  Lab 05/12/20 2227  PROCALCITON <0.10    Lab Results  Component Value Date   SARSCOV2NAA POSITIVE (A) 05/03/2020   SARSCOV2NAA NEGATIVE 01/21/2020   SARSCOV2NAA NEGATIVE 12/23/2019   SARSCOV2NAA NEGATIVE 11/09/2019     Prone/Incentive Spirometry: encouraged incentive spirometry use 3-4/hour.  Hyponatremia: Improving-mild-stop IV fluids-could have been secondary to mild dehydration in the setting of COVID-19.  History of nonischemic cardiomyopathy: Volume status relatively stable-has some mild lower extremity edema-echo on 5/24 showed normalization of EF.  HTN: BP controlled-continue Coreg-continue to hold losartan/hydralazine.  HLD: Continue statin  DM-2: CBG stable-but borderline hypoglycemic this morning-decrease Levemir to 10 units twice daily, continue continue SSI and Tradjenta  Recent Labs    05/16/20 0740 05/16/20 0929 05/16/20 1137  GLUCAP 73 92 89    Hypothyroidism: Continue Synthroid  GERD: Continue PPI  Depression/bipolar disorder: Stable-continue Prozac/Lamictal  Chronic MRSA infection and back hardware: On suppressive doxycycline.  OSA: CPAP nightly  Fall/debility/deconditioning: Secondary to acute illness with COVID-19-CT head without any  abnormalities, left hip x-ray without any acute bony findings.  Suspect has some significant amount of debility at baseline-normally walks with a cane/walker at home.  PT OT following with recommendations for home health services on discharge.  Nutrition Problem: Nutrition Problem: Increased nutrient needs Etiology: acute illness (COVID 19) Signs/Symptoms: estimated needs Interventions: Ensure Enlive (each supplement provides 350kcal and 20 grams of protein), MVI  Morbid Obesity: Estimated body mass index is 64.26 kg/m as calculated from the following:   Height as of this encounter: 5\' 4"  (1.626 m).   Weight as of this encounter: 169.8 kg.    ABG: No results found for: PHART, PCO2ART, PO2ART, HCO3, TCO2, ACIDBASEDEF, O2SAT  Vent Settings: N/A  Condition - Stable  Family Communication  :  Daughter updated over the phone 8/18  Code Status :  Full Code  Diet :  Diet Order            Diet heart healthy/carb modified Room service appropriate? Yes; Fluid consistency: Thin  Diet effective now                  Disposition Plan  :   Status is: Inpatient  Remains inpatient appropriate because:Inpatient level of care appropriate due to severity of illness   Dispo: The patient is from: Home              Anticipated d/c is to: Home              Anticipated d/c date is: 2 days              Patient currently is not medically stable to d/c.   Barriers to discharge: Hypoxia requiring O2 supplementation/complete 5 days of IV Remdesivir  Antimicorbials  :    Anti-infectives (From admission, onward)   Start     Dose/Rate Route Frequency Ordered Stop   05/14/20 1000  remdesivir 100 mg in sodium chloride 0.9 % 100 mL IVPB     Discontinue    "Followed by" Linked Group Details   100 mg 200 mL/hr over 30 Minutes Intravenous Daily 05/13/20 0015 05/18/20 0959   05/13/20 0130  doxycycline (VIBRA-TABS) tablet 100 mg     Discontinue     100 mg Oral 2 times daily 05/13/20 0113      05/13/20 0100  remdesivir 200 mg in sodium  chloride 0.9% 250 mL IVPB       "Followed by" Linked Group Details   200 mg 580 mL/hr over 30 Minutes Intravenous Once 05/13/20 0015 05/13/20 0507      Inpatient Medications  Scheduled Meds: . ascorbic acid  500 mg Oral Daily  . aspirin EC  81 mg Oral QHS  . baricitinib  4 mg Oral Daily  . carvedilol  6.25 mg Oral BID WC  . cholecalciferol  1,000 Units Oral Daily  . dexamethasone (DECADRON) injection  6 mg Intravenous Daily  . dicyclomine  20 mg Oral BID  . doxycycline  100 mg Oral BID  . enoxaparin (LOVENOX) injection  85 mg Subcutaneous Q24H  . feeding supplement (ENSURE ENLIVE)  237 mL Oral BID BM  . guaiFENesin  600 mg Oral BID  . insulin aspart  0-9 Units Subcutaneous TID PC & HS  . insulin detemir  10 Units Subcutaneous BID  . lamoTRIgine  200 mg Oral Daily  . levothyroxine  150 mcg Oral QHS  . linagliptin  5 mg Oral Daily  . montelukast  10 mg Oral QHS  . multivitamin with minerals  1 tablet Oral Daily  . OXcarbazepine  300 mg Oral Daily   And  . OXcarbazepine  600 mg Oral QHS  . pantoprazole  40 mg Oral Daily  . simvastatin  40 mg Oral QPM  . zinc sulfate  220 mg Oral Daily   Continuous Infusions: . sodium chloride 50 mL/hr at 05/16/20 0905  . remdesivir 100 mg in NS 100 mL 100 mg (05/16/20 0908)   PRN Meds:.acetaminophen, albuterol, guaiFENesin-dextromethorphan, magnesium hydroxide, polyethylene glycol, prochlorperazine, traZODone, zolpidem   Time Spent in minutes  25  See all Orders from today for further details   Jeoffrey Massed M.D on 05/16/2020 at 2:26 PM  To page go to [email or web address] - use universal password  [facility] -  Office  [phone]    Objective:   Vitals:   05/15/20 1038 05/15/20 1659 05/15/20 2318 05/16/20 0743  BP:  134/69 138/66 (!) 171/71  Pulse:  63 72 76  Resp:  20 20   Temp:  97.9 F (36.6 C) 98.2 F (36.8 C) 98 F (36.7 C)  TempSrc:   Oral Oral  SpO2: (!) 85% 94% 91%  94%  Weight:      Height:        Wt Readings from Last 3 Encounters:  05/12/20 (!) 169.8 kg  05/08/20 (!) 169.8 kg  04/15/20 (!) 170.1 kg     Intake/Output Summary (Last 24 hours) at 05/16/2020 1426 Last data filed at 05/16/2020 0828 Gross per 24 hour  Intake 310.37 ml  Output 2000 ml  Net -1689.63 ml     Physical Exam Gen Exam:Alert awake-not in any distress.  Chronically ill-appearing. HEENT:atraumatic, normocephalic Chest: B/L clear to auscultation anteriorly CVS:S1S2 regular Abdomen:soft non tender, non distended Extremities:trave edema Neurology: Non focal Skin: no rash   Data Review:    CBC Recent Labs  Lab 05/12/20 2227 05/14/20 0228 05/15/20 0108 05/16/20 0715  WBC 8.4 5.0 6.2 9.9  HGB 12.2 10.5* 10.5* 11.0*  HCT 37.5 32.0* 32.6* 34.2*  PLT 235 219 284 342  MCV 83.0 83.1 83.4 84.7  MCH 27.0 27.3 26.9 27.2  MCHC 32.5 32.8 32.2 32.2  RDW 15.9* 15.5 15.8* 15.9*  LYMPHSABS 0.5* 0.6* 0.7 1.6  MONOABS 1.0 0.8 0.6 1.1*  EOSABS 0.0 0.0 0.0 0.0  BASOSABS 0.0 0.0 0.0 0.0    Chemistries  Recent  Labs  Lab 05/12/20 2227 05/13/20 1612 05/14/20 0228 05/15/20 0108 05/16/20 0715  NA 121* 123* 125* 125* 129*  K 4.6 4.6 4.3 4.8 4.9  CL 86* 89* 90* 92* 90*  CO2 23 25 25 25 31   GLUCOSE 166* 189* 141* 199* 77  BUN 6* 8 16 26* 19  CREATININE 0.61 0.52 0.74 0.72 0.66  CALCIUM 8.8* 8.3* 8.4* 9.1 9.3  AST 43*  --  38 50* 38  ALT 37  --  36 47* 46*  ALKPHOS 73  --  64 64 65  BILITOT 1.1  --  0.9 0.3 0.6   ------------------------------------------------------------------------------------------------------------------ No results for input(s): CHOL, HDL, LDLCALC, TRIG, CHOLHDL, LDLDIRECT in the last 72 hours.  Lab Results  Component Value Date   HGBA1C 7.3 (H) 05/05/2020   ------------------------------------------------------------------------------------------------------------------ No results for input(s): TSH, T4TOTAL, T3FREE, THYROIDAB in the last  72 hours.  Invalid input(s): FREET3 ------------------------------------------------------------------------------------------------------------------ Recent Labs    05/15/20 0108 05/16/20 0715  FERRITIN 333* 268    Coagulation profile No results for input(s): INR, PROTIME in the last 168 hours.  Recent Labs    05/15/20 0108 05/16/20 0715  DDIMER 0.89* 1.19*    Cardiac Enzymes No results for input(s): CKMB, TROPONINI, MYOGLOBIN in the last 168 hours.  Invalid input(s): CK ------------------------------------------------------------------------------------------------------------------    Component Value Date/Time   BNP 139.5 (H) 05/16/2020 0715    Micro Results Recent Results (from the past 240 hour(s))  Blood Culture (routine x 2)     Status: None (Preliminary result)   Collection Time: 05/12/20 10:33 PM   Specimen: BLOOD  Result Value Ref Range Status   Specimen Description BLOOD RIGHT FOREARM  Final   Special Requests   Final    BOTTLES DRAWN AEROBIC AND ANAEROBIC Blood Culture adequate volume   Culture   Final    NO GROWTH 4 DAYS Performed at [facility], [address] [place], [place]    Report Status PENDING  Incomplete    Radiology Reports CT HEAD WO CONTRAST  Result Date: 05/13/2020 CLINICAL DATA:  Fall with facial trauma. EXAM: CT HEAD WITHOUT CONTRAST TECHNIQUE: Contiguous axial images were obtained from the base of the skull through the vertex without intravenous contrast. COMPARISON:  05/26/2011 FINDINGS: Brain: Ventricles, cisterns and other CSF spaces are normal. There is no mass, mass effect, shift of midline structures or acute hemorrhage. No evidence of acute infarction. Vascular: No hyperdense vessel or unexpected calcification. Skull: Normal. Negative for fracture or focal lesion. Sinuses/Orbits: No acute finding. Other: None. IMPRESSION: No acute findings. Electronically Signed   By: 05/28/2011 M.D.   On: 05/13/2020 11:30    DG Chest Port 1 View  Result Date: 05/12/2020 CLINICAL DATA:  COVID positive shortness of breath EXAM: PORTABLE CHEST 1 VIEW COMPARISON:  May 03, 2020 FINDINGS: The heart size and mediastinal contours are unchanged with mild cardiomegaly. Aortic knob calcifications. Interval significant worsening in the multifocal patchy hazy airspace opacities throughout both lungs, right worse than left. No pleural effusion. No acute osseous abnormality. Cervical fixation hardware seen. IMPRESSION: Interval worsening in the multifocal airspace opacities consistent with pneumonia. Electronically Signed   By: May 05, 2020 M.D.   On: 05/12/2020 22:26   DG Chest Port 1 View  Result Date: 05/03/2020 CLINICAL DATA:  Shortness of breath.  Hypoxia.  COVID-19 positive. EXAM: PORTABLE CHEST 1 VIEW COMPARISON:  Chest radiograph 10/31/2019 and CTA 11/09/2019 FINDINGS: The cardiac silhouette remains mildly enlarged. Aortic atherosclerosis is noted. There is  persistent mild elevation of the right hemidiaphragm. Compared to the prior radiograph, there is increased pulmonary vascular congestion with mild interstitial densities bilaterally. No sizable pleural effusion or pneumothorax is identified. Prior cervical fusion is noted. IMPRESSION: Cardiomegaly with pulmonary vascular congestion and mild bilateral interstitial opacities which may reflect edema or atypical/viral infection. Electronically Signed   By: Sebastian Ache M.D.   On: 05/03/2020 15:42   DG HIP UNILAT WITH PELVIS 2-3 VIEWS LEFT  Result Date: 05/13/2020 CLINICAL DATA:  Fall. EXAM: DG HIP (WITH OR WITHOUT PELVIS) 2-3V LEFT COMPARISON:  10/20/2016 FINDINGS: Left hip arthroplasty intact and unchanged. Moderate osteoarthritic change of the right hip. Stable irregularity over the inferior left acetabular region. Degenerate change of the spine with partially visualized fusion hardware intact over the lower lumbar spine. Mild degenerate change of the sacroiliac joints.  IMPRESSION: 1. No acute findings. 2. Moderate osteoarthritic change of the right hip. Left hip arthroplasty intact. Electronically Signed   By: Elberta Fortis M.D.   On: 05/13/2020 11:28   SLEEP STUDY DOCUMENTS  Result Date: 04/25/2020 Ordered by an unspecified provider.  SLEEP STUDY DOCUMENTS  Result Date: 04/18/2020 Ordered by an unspecified provider.

## 2020-05-16 NOTE — Progress Notes (Signed)
Physical Therapy Treatment Patient Details Name: Amy Barnes MRN: [id number] DOB: [birth year] Today's Date: 05/16/2020    History of Present Illness Amy Barnes  is a 69 y.o. Caucasian female with a known history of coronary artery disease, type 2 diabetes mellitus, hypertension, dyslipidemia, hypothyroidism and obstructive sleep apnea as well as recent COVID-19 for which she was admitted here and discharged 5 days ago on room air, who presents to the emergency room with acute onset of worsening dyspnea with hypoxemia.  Her pulse currently was 83% on room air.      PT Comments    Patient received in chair heavily asleep, able to wake but it took a few minutes. Willing to participate in therapy today, only able to gait train approximately 65ft in room with RW/close min guard due to bordering unsafe use of RW (pushing it too far ahead of herself) and multiple standing rest breaks to cough. SPO2 drop to 75% on 3LPM, not in significant distress and able to recover to 92% with extended time and heavily coached PLB due to mouth breathing tendency. Left up in recliner with all needs met, RN aware of patient status and VSS.     Follow Up Recommendations  Home health PT;Other (comment) (working towards no follow up)     Equipment Recommendations  None recommended by PT    Recommendations for Other Services       Precautions / Restrictions Precautions Precautions: Fall;Other (comment) Precaution Comments: COVID+ Restrictions Weight Bearing Restrictions: No    Mobility  Bed Mobility               General bed mobility comments: OOB in chair  Transfers Overall transfer level: Needs assistance Equipment used: Rolling walker (2 wheeled) Transfers: Sit to/from Stand Sit to Stand: Min guard         General transfer comment: min guard to ensure safety and steadiness with RW, assist for line management  Ambulation/Gait Ambulation/Gait assistance: Min guard Gait Distance (Feet): 20  Feet   Gait Pattern/deviations: Step-through pattern;Trunk flexed;Wide base of support Gait velocity: decreased   General Gait Details: slow with RW, tends to push RW an unsafe distance in front of her; more fatigued today and needed multiple standing rest breaks to cough/rest   Stairs             Wheelchair Mobility    Modified Rankin (Stroke Patients Only)       Balance Overall balance assessment: Needs assistance;History of Falls Sitting-balance support: No upper extremity supported;Feet supported Sitting balance-Leahy Scale: Fair Sitting balance - Comments: but did not challenge dynamically   Standing balance support: Bilateral upper extremity supported;During functional activity Standing balance-Leahy Scale: Poor Standing balance comment: reliant on BUE support                            Cognition Arousal/Alertness: Awake/alert Behavior During Therapy: Flat affect Overall Cognitive Status: Within Functional Limits for tasks assessed                                 General Comments: very fatigued today, took a few minutes to really wake her up but VSS on 3LPM O2 at rest      Exercises      General Comments General comments (skin integrity, edema, etc.): on 3LPM today- able to gait train approxiamtely 84ft but SOB and with frequent stops to cough;  Spo2 drop to 75% on 3LPM but able to resaturate to 92% with seated rest and heavily coached PLB. VSS at EOS.      Pertinent Vitals/Pain Pain Assessment: No/denies pain Pain Score: 0-No pain Pain Intervention(s): Limited activity within patient's tolerance;Monitored during session    Home Living                      Prior Function            PT Goals (current goals can now be found in the care plan section) Acute Rehab PT Goals Patient Stated Goal: be able to take care of myself PT Goal Formulation: With patient Time For Goal Achievement: 05/28/20 Potential to Achieve  Goals: Good Progress towards PT goals: Progressing toward goals    Frequency    Min 3X/week      PT Plan Current plan remains appropriate    Co-evaluation              AM-PAC PT "6 Clicks" Mobility   Outcome Measure  Help needed turning from your back to your side while in a flat bed without using bedrails?: A Little Help needed moving from lying on your back to sitting on the side of a flat bed without using bedrails?: A Little Help needed moving to and from a bed to a chair (including a wheelchair)?: A Little Help needed standing up from a chair using your arms (e.g., wheelchair or bedside chair)?: A Little Help needed to walk in hospital room?: A Little Help needed climbing 3-5 steps with a railing? : A Lot 6 Click Score: 17    End of Session Equipment Utilized During Treatment: Oxygen Activity Tolerance: Patient tolerated treatment well Patient left: in chair;with call bell/phone within reach Nurse Communication: Mobility status PT Visit Diagnosis: Unsteadiness on feet (R26.81);Difficulty in walking, not elsewhere classified (R26.2);Muscle weakness (generalized) (M62.81)     Time: 2500-3704 PT Time Calculation (min) (ACUTE ONLY): 25 min  Charges:  $Gait Training: 8-22 mins $Self Care/Home Management: 8-22                     Windell Norfolk, DPT, PN1   Supplemental Physical Therapist [place]    Pager [phone] Acute Rehab Office [phone]

## 2020-05-16 NOTE — Plan of Care (Signed)

## 2020-05-17 LAB — D-DIMER, QUANTITATIVE: D-Dimer, Quant: 2.68 ug/mL-FEU — ABNORMAL HIGH (ref 0.00–0.50)

## 2020-05-17 LAB — CBC WITH DIFFERENTIAL/PLATELET
Abs Immature Granulocytes: 0.07 10*3/uL (ref 0.00–0.07)
Basophils Absolute: 0 10*3/uL (ref 0.0–0.1)
Basophils Relative: 0 %
Eosinophils Absolute: 0 10*3/uL (ref 0.0–0.5)
Eosinophils Relative: 0 %
HCT: 32.3 % — ABNORMAL LOW (ref 36.0–46.0)
Hemoglobin: 10.5 g/dL — ABNORMAL LOW (ref 12.0–15.0)
Immature Granulocytes: 1 %
Lymphocytes Relative: 12 %
Lymphs Abs: 1.1 10*3/uL (ref 0.7–4.0)
MCH: 27 pg (ref 26.0–34.0)
MCHC: 32.5 g/dL (ref 30.0–36.0)
MCV: 83 fL (ref 80.0–100.0)
Monocytes Absolute: 0.7 10*3/uL (ref 0.1–1.0)
Monocytes Relative: 8 %
Neutro Abs: 6.9 10*3/uL (ref 1.7–7.7)
Neutrophils Relative %: 79 %
Platelets: 301 10*3/uL (ref 150–400)
RBC: 3.89 MIL/uL (ref 3.87–5.11)
RDW: 15.8 % — ABNORMAL HIGH (ref 11.5–15.5)
WBC: 8.8 10*3/uL (ref 4.0–10.5)
nRBC: 0 % (ref 0.0–0.2)

## 2020-05-17 LAB — COMPREHENSIVE METABOLIC PANEL
ALT: 43 U/L (ref 0–44)
AST: 36 U/L (ref 15–41)
Albumin: 2.6 g/dL — ABNORMAL LOW (ref 3.5–5.0)
Alkaline Phosphatase: 65 U/L (ref 38–126)
Anion gap: 8 (ref 5–15)
BUN: 15 mg/dL (ref 8–23)
CO2: 30 mmol/L (ref 22–32)
Calcium: 9 mg/dL (ref 8.9–10.3)
Chloride: 88 mmol/L — ABNORMAL LOW (ref 98–111)
Creatinine, Ser: 0.39 mg/dL — ABNORMAL LOW (ref 0.44–1.00)
GFR calc Af Amer: >60 mL/min (ref >60)
GFR calc non Af Amer: >60 mL/min (ref >60)
Glucose, Bld: 113 mg/dL — ABNORMAL HIGH (ref 70–99)
Potassium: 4.8 mmol/L (ref 3.5–5.1)
Sodium: 126 mmol/L — ABNORMAL LOW (ref 135–145)
Total Bilirubin: 0.5 mg/dL (ref 0.3–1.2)
Total Protein: 5.2 g/dL — ABNORMAL LOW (ref 6.5–8.1)

## 2020-05-17 LAB — CULTURE, BLOOD (ROUTINE X 2)
Culture: NO GROWTH
Special Requests: ADEQUATE

## 2020-05-17 LAB — GLUCOSE, CAPILLARY
Glucose-Capillary: 69 mg/dL — ABNORMAL LOW (ref 70–99)
Glucose-Capillary: 102 mg/dL — ABNORMAL HIGH (ref 70–99)
Glucose-Capillary: 208 mg/dL — ABNORMAL HIGH (ref 70–99)
Glucose-Capillary: 296 mg/dL — ABNORMAL HIGH (ref 70–99)
Glucose-Capillary: 197 mg/dL — ABNORMAL HIGH (ref 70–99)

## 2020-05-17 LAB — C-REACTIVE PROTEIN: CRP: 11.8 mg/dL — ABNORMAL HIGH (ref <1.0)

## 2020-05-17 LAB — FERRITIN: Ferritin: 226 ng/mL (ref 11–307)

## 2020-05-17 MED ORDER — SALINE SPRAY 0.65 % NA SOLN
1.0000 | NASAL | Status: DC | PRN
  Administered 2020-05-21: 1 via NASAL
  Filled 2020-05-17: qty 44

## 2020-05-17 MED ORDER — METHYLPREDNISOLONE SODIUM SUCC 125 MG IJ SOLR
60.0000 mg | Freq: Two times a day (BID) | INTRAMUSCULAR | Status: DC
  Administered 2020-05-17 – 2020-05-18 (×3): 60 mg via INTRAVENOUS
  Filled 2020-05-17 (×3): qty 2

## 2020-05-17 MED ORDER — FUROSEMIDE 10 MG/ML IJ SOLN
40.0000 mg | Freq: Once | INTRAMUSCULAR | Status: AC
  Administered 2020-05-17: 40 mg via INTRAVENOUS
  Filled 2020-05-17: qty 4

## 2020-05-17 MED ORDER — LIP MEDEX EX OINT
TOPICAL_OINTMENT | CUTANEOUS | Status: DC | PRN
  Filled 2020-05-17 (×2): qty 7

## 2020-05-17 NOTE — Progress Notes (Signed)
SATURATION QUALIFICATIONS: (This note is used to comply with regulatory documentation for home oxygen)  Patient Saturations on Room Air at Rest = unable   Patient Saturations on Room Air while Ambulating = unable  Patient Saturations on 3 Liters of oxygen while Ambulating = 86% (desats to 80% with attempt at 2 L O2)  Please briefly explain why patient needs home oxygen: Pt requires O2 to be able to complete ADLs and mobility safely due to decreased cardiopulmonary tolerance  **This assessment was completed with very short distance mobility in room.

## 2020-05-17 NOTE — TOC Initial Note (Signed)
Transition of Care [facility]) - Initial/Assessment Note    Patient Details  Name: Amy Barnes MRN: [id number] Date of Birth: [birth year]  Transition of Care [facility]) CM/SW Contact:    Beckie Busing, RN Phone Number: [phone]  05/17/2020, 2:53 PM  Clinical Narrative:   CM consulted for patient needing home O2 and HH PT/OT. Spoke with patients daughter [name] and offered choice for services. Home health set up with [facility] and O2 set up with [facility]. CM will continue to follow for disposition needs.    Expected Discharge Plan: Home w Home Health Services Barriers to Discharge: No Barriers Identified   Patient Goals and CMS Choice   CMS Medicare.gov Compare Post Acute Care list provided to:: Patient Represenative (must comment) (Daughter [name]) Choice offered to / list presented to : Adult Children  Expected Discharge Plan and Services Expected Discharge Plan: Home w Home Health Services In-house Referral: NA Discharge Planning Services: CM Consult Post Acute Care Choice: Home Health Living arrangements for the past 2 months: Single Family Home                 DME Arranged: Oxygen DME Agency: Other - Comment [facility]) Date DME Agency Contacted: 05/17/20 Time DME Agency Contacted: 1451 Representative spoke with at DME Agency: Vaughan Basta HH Arranged: PT, OT HH Agency: [facility] Date [facility] Agency Contacted: 05/17/20 Time HH Agency Contacted: 1451 Representative spoke with at [facility] Agency: Kandee Keen  Prior Living Arrangements/Services Living arrangements for the past 2 months: Single Family Home Lives with:: Self          Need for Family Participation in Patient Care: Yes (Comment) Care giver support system in place?: Yes (comment)   Criminal Activity/Legal Involvement Pertinent to Current Situation/Hospitalization: No - Comment as needed  Activities of Daily Living Home Assistive Devices/Equipment: Bedside commode/3-in-1, Built-in shower seat ADL  Screening (condition at time of admission) Patient's cognitive ability adequate to safely complete daily activities?: Yes Is the patient deaf or have difficulty hearing?: No Does the patient have difficulty seeing, even when wearing glasses/contacts?: No Does the patient have difficulty concentrating, remembering, or making decisions?: No Patient able to express need for assistance with ADLs?: Yes Does the patient have difficulty dressing or bathing?: Yes Independently performs ADLs?: No Dressing (OT): Needs assistance Is this a change from baseline?: Pre-admission baseline Bathing: Needs assistance Is this a change from baseline?: Pre-admission baseline Toileting: Needs assistance Is this a change from baseline?: Pre-admission baseline In/Out Bed: Needs assistance Is this a change from baseline?: Pre-admission baseline Walks in Home: Independent with device (comment) Does the patient have difficulty walking or climbing stairs?: Yes Weakness of Legs: Both Weakness of Arms/Hands: None  Permission Sought/Granted                  Emotional Assessment              Admission diagnosis:  Fall [W19.XXXA] Acute hypoxemic respiratory failure due to COVID-19 (HCC) [U07.1, J96.01] COVID-19 [U07.1] Patient Active Problem List   Diagnosis Date Noted  . COVID-19 05/12/2020  . Hypoxia 05/04/2020  . Acute respiratory failure with hypoxia (HCC) 05/04/2020  . OSA (obstructive sleep apnea) 05/04/2020  . Obesity hypoventilation syndrome (HCC) 05/04/2020  . LVH (left ventricular hypertrophy) 05/04/2020  . HLD (hyperlipidemia) 05/04/2020  . Pneumonia due to COVID-19 virus 05/03/2020  . Non-ischemic cardiomyopathy (HCC) 12/29/2019  . Acute HFrEF (heart failure with reduced ejection fraction) (HCC) 11/25/2019  . UTI (urinary tract infection)  11/25/2019  . NSTEMI (non-ST elevated myocardial infarction) (HCC) 11/09/2019  . QT prolongation 11/09/2019  . Acute kidney injury (HCC)  11/04/2019  . Hyponatremia 11/03/2019  . Epigastric abdominal pain 11/03/2019  . Nausea and vomiting 11/03/2019  . Diminished pulses in lower extremity 08/11/2019  . Osteopenia 08/11/2019  . Wound infection complicating hardware (HCC) 04/01/2019  . 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 Bilateral lower extremity edema 11/29/2018  . Neurogenic urinary incontinence 11/29/2018  . Urinary incontinence 11/29/2018  . Bladder spasms 11/29/2018  . Discogenic low back pain 11/29/2018  . Chronic sacroiliac joint pain (Bilateral) (R>L) 11/29/2018  . Spondylosis without myelopathy or radiculopathy, lumbar region 11/09/2018  . Chronic low back pain (Bilateral) (L>R) w/o sciatica 11/09/2018  . History of hip replacement (Left) 10/13/2018  . History of back surgery 10/13/2018  . Vitamin D insufficiency 10/13/2018  . Osteoarthritis of facet joint of lumbar spine 10/13/2018  . Osteoarthritis involving multiple joints 10/13/2018  . Cervical radiculopathy (Left) 10/13/2018  . Lumbar facet arthropathy (Bilateral) 10/13/2018  . Lumbar facet syndrome (Bilateral) (L>R) 10/13/2018  . Abnormal MRI, lumbar spine (09/16/2018) 10/13/2018  . Lumbar nerve root compression (L4) (Left) 10/13/2018  . Lumbar foraminal stenosis 10/13/2018  . 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 Chronic low back pain (Primary Area of Pain) (Bilateral) (L>R) w/ sciatica (Left) 09/14/2018  . Chronic lower extremity pain (Secondary Area of Pain) (Left) 09/14/2018  . Chronic hip pain [facility] Area of Pain) (Bilateral) (L>R) 09/14/2018  . Chronic knee pain (Fourth Area of Pain) (Bilateral) (R>L) 09/14/2018  . Pharmacologic therapy 09/14/2018  . Disorder of skeletal system 09/14/2018  . Problems influencing health status 09/14/2018  . Primary osteoarthritis of first carpometacarpal joint of left hand 04/20/2018  . Local reaction to  pneumococcal vaccine 01/16/2018  . PAH (pulmonary artery hypertension) (HCC) 08/03/2017  . Advanced care planning/counseling discussion 06/25/2017  . Synovial cyst of lumbar facet joint 11/13/2016  . Thoracic aortic atherosclerosis (HCC)   . CAD (coronary artery disease) 07/30/2016  . Centrilobular emphysema (HCC) 07/30/2016  . Ex-smoker 07/08/2016  . Nonspecific abnormal electrocardiogram (ECG) (EKG) 07/08/2016  . Chronic lumbar radicular pain (L4) (Bilateral) 06/09/2016  . DOE (dyspnea on exertion) 05/22/2016  . Degenerative joint disease (DJD) of hip 04/17/2016  . Somatic dysfunction of sacroiliac joint 04/17/2016  . S/P Lumbar Fusion (L3-4 PLIF) 04/17/2016  . Cervical fusion syndrome 04/17/2016  . Pedal edema 02/19/2016  . Chronic pain syndrome 02/19/2016  . Abdominal aortic atherosclerosis (HCC) 12/11/2015  . MRSA (methicillin resistant Staphylococcus aureus) infection 10/11/2012  . Urinary urgency 07/28/2012  . Infection or inflammatory reaction due to internal joint prosthesis (HCC) 12/03/2011  . Morbid obesity with BMI of 60.0-69.9, adult (HCC) 05/13/2011  . Essential hypertension 05/17/2010  . Cervical radiculopathy (Right) 12/25/2009  . Benign positional vertigo 07/21/2008  . Hyperlipidemia associated with type 2 diabetes mellitus (HCC) 08/16/2007  . Hypothyroidism 07/30/2007  . DM type 2 with diabetic peripheral neuropathy (HCC) 07/30/2007  . Normocytic anemia 07/30/2007  . Bipolar disorder (HCC) 07/30/2007  . Allergic rhinitis 07/30/2007  . Asthma 07/30/2007  . GERD 07/30/2007  . Osteoarthritis 07/30/2007  . OSA on CPAP 07/30/2007   PCP:  [name], MD Pharmacy:   [facility] - [place], [place] - [address] [place] [address] Phone: [phone] Fax: [phone]  [facility] [phone] - [place], [place] - [address]  Clearnce Sorrel Jerilynn M[place] [address] Phone: [phone] Fax:  [phone]  [facility] [place], [place] - [address] [place] [address] Phone: [phone] Fax: [phone]     Social Determinants of Health (SDOH) Interventions    Readmission Risk Interventions Readmission Risk Prevention Plan 11/14/2019  Transportation Screening Complete  PCP or Specialist Appt within 3-5 Days Complete  HRI or Home Care Consult Complete  Social Work Consult for Recovery Care Planning/Counseling Complete  Palliative Care Screening Not Applicable  Medication Review [employer]) Complete  Some recent data might be hidden

## 2020-05-17 NOTE — Progress Notes (Signed)
Occupational Therapy Treatment Patient Details Name: Amy Barnes MRN: [id number] DOB: 08/16/1951 Today's Date: 05/17/2020    History of present illness Amy Barnes  is a 69 y.o. Caucasian female with a known history of coronary artery disease, type 2 diabetes mellitus, hypertension, dyslipidemia, hypothyroidism and obstructive sleep apnea as well as recent COVID-19 for which she was admitted here and discharged 5 days ago on room air, who presents to the emergency room with acute onset of worsening dyspnea with hypoxemia.  Her pulse currently was 83% on room air.     OT comments  Pt progressing with OT goals and decreased O2 demands today, but continues to be limited by decreased cardiopulmonary tolerance with minimal activity. Pt received on 2.5 L O2, 92% at rest. Guided pt in short distance mobility to/from sink with bariatric RW at min guard. Pt completed grooming tasks standing at sink for 5 minutes, no physical assist needed. Trialed pt on 3 L O2 with sustained SPO2 upper 80s, titrated to 2 L O2 with desats to 80% after return to recliner chair. With pursed lip breathing cues and flutter valve/IS use, pt returned to 88-90% and left on 2 L O2. Coordinated with nursing secretary to obtain bariatric BSC to maximize pt independence with ADLs. .ju   Follow Up Recommendations  Home health OT;Supervision/Assistance - 24 hour    Equipment Recommendations  None recommended by OT (has all needed DME)    Recommendations for Other Services      Precautions / Restrictions Precautions Precautions: Fall;Other (comment) Precaution Comments: COVID+, monitor O2 Restrictions Weight Bearing Restrictions: No       Mobility Bed Mobility               General bed mobility comments: OOB in chair  Transfers Overall transfer level: Needs assistance Equipment used: Rolling walker (2 wheeled) Transfers: Sit to/from [employer] Sit to Stand: Supervision Stand pivot transfers: Min  guard       General transfer comment: Supervision for standing, no assist needed. min guard to ensure steadiness on return to chair    Balance Overall balance assessment: Needs assistance;History of Falls Sitting-balance support: No upper extremity supported;Feet supported Sitting balance-Leahy Scale: Fair     Standing balance support: Bilateral upper extremity supported;During functional activity Standing balance-Leahy Scale: Poor Standing balance comment: leaning heavily on sink, but also noted to pick up RW in attempt to assist in avoiding lines. improving balance                           ADL either performed or assessed with clinical judgement   ADL Overall ADL's : Needs assistance/impaired     Grooming: Supervision/safety;Standing;Oral care;Brushing hair Grooming Details (indicate cue type and reason): Supervision for grooming tasks standing at sink, monitoring vitals. Pt leaning heavily on sink but reports she does this at baseline due to back pain             Lower Body Dressing: Moderate assistance;Sit to/from stand Lower Body Dressing Details (indicate cue type and reason): Max A to don socks, reports wearing flip flops or slide on shoes at home             Functional mobility during ADLs: Min guard;Rolling walker General ADL Comments: Pt with improved O2 demands today, but continues to be limited by decreased cardiopulmonary tolerance, fatigues quickly      Vision   Vision Assessment?: No apparent visual deficits   Perception  Praxis      Cognition Arousal/Alertness: Awake/alert Behavior During Therapy: WFL for tasks assessed/performed Overall Cognitive Status: Within Functional Limits for tasks assessed                                 General Comments: pleasant and cooperative        Exercises     Shoulder Instructions       General Comments Pt received on 2.5 L O3, 92% at rest. Trialed short distance mobility  on 3 L O2 and grooming standing at sink with sustained upper 80s (86-88%). Decreased to 2 LO2 with sustained low 80s (84%). with short distance mobility back to chair, pt desats to 80% on 2 L O2 with 3 min seated rest break to recover. Completion of flutter valve/incentive spirometer assisted in recovery    Pertinent Vitals/ Pain       Pain Assessment: No/denies pain  Home Living                                          Prior Functioning/Environment              Frequency  Min 2X/week        Progress Toward Goals  OT Goals(current goals can now be found in the care plan section)  Progress towards OT goals: Progressing toward goals  Acute Rehab OT Goals Patient Stated Goal: be able to take care of myself OT Goal Formulation: With patient Time For Goal Achievement: 05/28/20 Potential to Achieve Goals: Good ADL Goals Pt Will Perform Grooming: with supervision;standing Pt Will Perform Lower Body Bathing: with min guard assist;sit to/from stand Pt Will Transfer to Toilet: with supervision;ambulating;bedside commode Pt Will Perform Toileting - Clothing Manipulation and hygiene: sit to/from stand;with min guard assist Additional ADL Goal #1: Pt to verbalize at least 3 energy conservation strategies in order to maximize ADL independence  Plan Discharge plan remains appropriate    Co-evaluation                 AM-PAC OT "6 Clicks" Daily Activity     Outcome Measure   Help from another person eating meals?: None Help from another person taking care of personal grooming?: A Little Help from another person toileting, which includes using toliet, bedpan, or urinal?: A Lot Help from another person bathing (including washing, rinsing, drying)?: A Lot Help from another person to put on and taking off regular upper body clothing?: A Little Help from another person to put on and taking off regular lower body clothing?: A Lot 6 Click Score: 16    End of  Session Equipment Utilized During Treatment: Rolling walker;Other (comment)  OT Visit Diagnosis: Unsteadiness on feet (R26.81);Other abnormalities of gait and mobility (R26.89);Muscle weakness (generalized) (M62.81);History of falling (Z91.81)   Activity Tolerance Patient tolerated treatment well   Patient Left in chair;with call bell/phone within reach   Nurse Communication Mobility status;Other (comment) (O2 stats)        Time: 1937-9024 OT Time Calculation (min): 29 min  Charges: OT General Charges $OT Visit: 1 Visit OT Treatments $Self Care/Home Management : 8-22 mins $Therapeutic Activity: 8-22 mins  Lorre Munroe, OTR/L   Lorre Munroe 05/17/2020, 12:59 PM

## 2020-05-17 NOTE — Progress Notes (Signed)
PROGRESS NOTE                                                                                                                                                                                                             Patient Demographics:    Amy Barnes, is a 69 y.o. female, DOB - September 09, 1951, YQI:[id number]  Outpatient Primary MD for the patient is [name], MD   Admit date - 05/12/2020   LOS - 5  Chief Complaint  Patient presents with  . Shortness of Breath       Brief Narrative: Patient is a 69 y.o. 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  See below for further details.  COVID-19 vaccinated status: Unvaccinated  Significant Events: 8/5-8/10>> hospitalized for hypoxia secondary to COVID-19-discharged on room air. 8/14>> presented back to the ED with shortness of breath-readmitted for hypoxia secondary to COVID-19 pneumonitis  Significant studies: 8/14>>Chest x-ray: Interval worsening in the multifocal airspace opacities consistent with pneumonia 8/15>> x-ray hip: No acute findings-moderate OA changes of the right hip, left hip arthroplasty intact. 8/15>> CT head: No acute findings  COVID-19 medications: Steroids: 8/14>> Remdesivir: 8/14>> 8/19  Antibiotics: Doxycycline-resumed in the hospital-on chronically for chronic MRSA infection in back hardware.  Microbiology data: 8/14 blood culture: No growth   Procedures: None  Consults: None  DVT prophylaxis: Prophylactic Lovenox at daily dosing.    Subjective:   Feels  better-Down to 2 L of oxygen this morning.  However does acknowledge significant shortness of breath with ambulation.   Assessment  & Plan :   Acute Hypoxic Resp Failure due to Covid 19 Viral pneumonia: Improving-Down to 2 L of oxygen today-we will finish remdesivir on 8/19-CRP remains elevated-but hypoxia is improving.  Stop Decadron-we will switch to Solu-Medrol while inpatient.  Suspect will require home O2 on discharge-we will asked RN to perform ambulatory screen.  If clinical improvement continues-she should be able to go home soon.   Fever: afebrile  O2 requirements:  SpO2: 93 % O2 Flow Rate (L/min): 2 L/min   COVID-19  Labs: Recent Labs    05/15/20 0108 05/16/20 0715 05/17/20 0213  DDIMER 0.89* 1.19* 2.68*  FERRITIN 333* 268 226  CRP 13.4* 7.3* 11.8*       Component Value Date/Time   BNP 139.5 (H) 05/16/2020 0715    Recent Labs  Lab 05/12/20 2227  PROCALCITON <0.10    Lab Results  Component Value Date   SARSCOV2NAA POSITIVE (A) 05/03/2020   SARSCOV2NAA NEGATIVE 01/21/2020   SARSCOV2NAA NEGATIVE 12/23/2019   SARSCOV2NAA NEGATIVE 11/09/2019     Prone/Incentive Spirometry: encouraged incentive spirometry use 3-4/hour.  Hyponatremia: Appears to have more lower extremity edema today-apparently takes as needed Lasix at home.  Will give 1 dose of IV Lasix-repeat electrolytes tomorrow.   History of nonischemic cardiomyopathy: Volume status relatively stable-except for more lower extremity edema today-giving 1 dose of IV Lasix today-echo on 5/24 showed normalization of EF.  HTN: BP controlled-continue Coreg-continue to hold losartan/hydralazine.  HLD: Continue statin  DM-2: CBG stable-but need to watch closely as steroid dosage has been excluded.  Continue Levemir 10 units twice daily, SSI and Tradjenta.  Follow and adjust.    Recent Labs    05/17/20 0820 05/17/20 0856 05/17/20 1148  GLUCAP 69* 102* 208*    Hypothyroidism: Continue Synthroid  GERD:  Continue PPI  Depression/bipolar disorder: Stable-continue Prozac/Lamictal  Chronic MRSA infection and back hardware: On suppressive doxycycline.  OSA: CPAP nightly  Fall/debility/deconditioning: Secondary to acute illness with COVID-19-CT head without any abnormalities, left hip x-ray without any acute bony findings.  Suspect has some significant amount of debility at baseline-normally walks with a cane/walker at home.  PT OT following with recommendations for home health services on discharge.  Nutrition Problem: Nutrition Problem: Increased nutrient needs Etiology: acute illness (COVID 19) Signs/Symptoms: estimated needs Interventions: Ensure Enlive (each supplement provides 350kcal and 20 grams of protein), MVI  Morbid Obesity: Estimated body mass index is 63.5 kg/m as calculated from the following:   Height as of this encounter:  (1.626 m).   Weight as of this encounter: 167.8 kg.    ABG: No results found for: PHART, PCO2ART, PO2ART, HCO3, TCO2, ACIDBASEDEF, O2SAT  Vent Settings: N/A  Condition - Stable  Family Communication  :  Daughter [name] [phone]) updated over the phone 8/19  Code Status :  Full Code  Diet :  Diet Order            Diet heart healthy/carb modified Room service appropriate? Yes; Fluid consistency: Thin  Diet effective now                  Disposition Plan  :   Status is: Inpatient  Remains inpatient appropriate because:Inpatient level of care appropriate due to severity of illness   Dispo: The patient is from: Home              Anticipated d/c is to: Home              Anticipated d/c date is: 2 days              Patient currently is not medically stable to d/c.   Barriers to discharge: Hypoxia requiring O2 supplementation/complete 5 days of IV Remdesivir  Antimicorbials  :    Anti-infectives (From admission, onward)   Start     Dose/Rate Route Frequency Ordered Stop   05/14/20 1000  remdesivir 100 mg in sodium chloride  0.9 % 100 mL IVPB       "Followed by" Linked Group Details  100 mg 200 mL/hr over 30 Minutes Intravenous Daily 05/13/20 0015 05/17/20 0856   05/13/20 0130  doxycycline (VIBRA-TABS) tablet 100 mg        100 mg Oral 2 times daily 05/13/20 0113     05/13/20 0100  remdesivir 200 mg in sodium chloride 0.9% 250 mL IVPB       "Followed by" Linked Group Details   200 mg 580 mL/hr over 30 Minutes Intravenous Once 05/13/20 0015 05/13/20 0507      Inpatient Medications  Scheduled Meds: . ascorbic acid  500 mg Oral Daily  . aspirin EC  81 mg Oral QHS  . baricitinib  4 mg Oral Daily  . carvedilol  6.25 mg Oral BID WC  . cholecalciferol  1,000 Units Oral Daily  . dicyclomine  20 mg Oral BID  . doxycycline  100 mg Oral BID  . enoxaparin (LOVENOX) injection  85 mg Subcutaneous Q24H  . feeding supplement (ENSURE ENLIVE)  237 mL Oral BID BM  . guaiFENesin  600 mg Oral BID  . insulin aspart  0-9 Units Subcutaneous TID PC & HS  . insulin detemir  10 Units Subcutaneous BID  . lamoTRIgine  200 mg Oral Daily  . levothyroxine  150 mcg Oral QHS  . linagliptin  5 mg Oral Daily  . methylPREDNISolone (SOLU-MEDROL) injection  60 mg Intravenous Q12H  . montelukast  10 mg Oral QHS  . multivitamin with minerals  1 tablet Oral Daily  . OXcarbazepine  300 mg Oral Daily   And  . OXcarbazepine  600 mg Oral QHS  . pantoprazole  40 mg Oral Daily  . simvastatin  40 mg Oral QPM  . zinc sulfate  220 mg Oral Daily   Continuous Infusions: . sodium chloride 10 mL/hr at 05/16/20 1551   PRN Meds:.acetaminophen, albuterol, guaiFENesin-dextromethorphan, magnesium hydroxide, polyethylene glycol, prochlorperazine, traZODone, zolpidem   Time Spent in minutes  25  See all Orders from today for further details   Jeoffrey Massed M.D on 05/17/2020 at 12:24 PM  To page go to [email or web address] - use universal password  [facility] -  Office  [phone]    Objective:   Vitals:   05/16/20 2250 05/17/20 0717  05/17/20 0807 05/17/20 0908  BP: (!) 150/68  (!) 158/72   Pulse: 66  70   Resp:      Temp: 98.2 F (36.8 C)  98.1 F (36.7 C)   TempSrc: Oral     SpO2: 94%  94% 93%  Weight:  (!) 167.8 kg    Height:        Wt Readings from Last 3 Encounters:  05/17/20 (!) 167.8 kg  05/08/20 (!) 169.8 kg  04/15/20 (!) 170.1 kg     Intake/Output Summary (Last 24 hours) at 05/17/2020 1224 Last data filed at 05/17/2020 1151 Gross per 24 hour  Intake --  Output 4450 ml  Net -4450 ml     Physical Exam Gen Exam:Alert awake-not in any distress HEENT:atraumatic, normocephalic Chest: B/L clear to auscultation anteriorly CVS:S1S2 regular Abdomen:soft non tender, non distended Extremities:+ edema Neurology: Non focal Skin: no rash   Data Review:    CBC Recent Labs  Lab 05/12/20 2227 05/14/20 0228 05/15/20 0108 05/16/20 0715 05/17/20 0213  WBC 8.4 5.0 6.2 9.9 8.8  HGB 12.2 10.5* 10.5* 11.0* 10.5*  HCT 37.5 32.0* 32.6* 34.2* 32.3*  PLT 235 219 284 342 301  MCV 83.0 83.1 83.4 84.7 83.0  MCH 27.0 27.3 26.9 27.2 27.0  MCHC  32.5 32.8 32.2 32.2 32.5  RDW 15.9* 15.5 15.8* 15.9* 15.8*  LYMPHSABS 0.5* 0.6* 0.7 1.6 1.1  MONOABS 1.0 0.8 0.6 1.1* 0.7  EOSABS 0.0 0.0 0.0 0.0 0.0  BASOSABS 0.0 0.0 0.0 0.0 0.0    Chemistries  Recent Labs  Lab 05/12/20 2227 05/12/20 2227 05/13/20 1612 05/14/20 0228 05/15/20 0108 05/16/20 0715 05/17/20 0213  NA 121*   < > 123* 125* 125* 129* 126*  K 4.6   < > 4.6 4.3 4.8 4.9 4.8  CL 86*   < > 89* 90* 92* 90* 88*  CO2 23   < > 25 25 25 31 30   GLUCOSE 166*   < > 189* 141* 199* 77 113*  BUN 6*   < > 8 16 26* 19 15  CREATININE 0.61   < > 0.52 0.74 0.72 0.66 0.39*  CALCIUM 8.8*   < > 8.3* 8.4* 9.1 9.3 9.0  AST 43*  --   --  38 50* 38 36  ALT 37  --   --  36 47* 46* 43  ALKPHOS 73  --   --  64 64 65 65  BILITOT 1.1  --   --  0.9 0.3 0.6 0.5   < > = values in this interval not displayed.    ------------------------------------------------------------------------------------------------------------------ No results for input(s): CHOL, HDL, LDLCALC, TRIG, CHOLHDL, LDLDIRECT in the last 72 hours.  Lab Results  Component Value Date   HGBA1C 7.3 (H) 05/05/2020   ------------------------------------------------------------------------------------------------------------------ No results for input(s): TSH, T4TOTAL, T3FREE, THYROIDAB in the last 72 hours.  Invalid input(s): FREET3 ------------------------------------------------------------------------------------------------------------------ Recent Labs    05/16/20 0715 05/17/20 0213  FERRITIN 268 226    Coagulation profile No results for input(s): INR, PROTIME in the last 168 hours.  Recent Labs    05/16/20 0715 05/17/20 0213  DDIMER 1.19* 2.68*    Cardiac Enzymes No results for input(s): CKMB, TROPONINI, MYOGLOBIN in the last 168 hours.  Invalid input(s): CK ------------------------------------------------------------------------------------------------------------------    Component Value Date/Time   BNP 139.5 (H) 05/16/2020 0715    Micro Results Recent Results (from the past 240 hour(s))  Blood Culture (routine x 2)     Status: None   Collection Time: 05/12/20 10:33 PM   Specimen: BLOOD  Result Value Ref Range Status   Specimen Description BLOOD RIGHT FOREARM  Final   Special Requests   Final    BOTTLES DRAWN AEROBIC AND ANAEROBIC Blood Culture adequate volume   Culture   Final    NO GROWTH 5 DAYS Performed at [facility], [address] [place], [place]    Report Status 05/17/2020 FINAL  Final    Radiology Reports CT HEAD WO CONTRAST  Result Date: 05/13/2020 CLINICAL DATA:  Fall with facial trauma. EXAM: CT HEAD WITHOUT CONTRAST TECHNIQUE: Contiguous axial images were obtained from the base of the skull through the vertex without intravenous contrast. COMPARISON:  05/26/2011  FINDINGS: Brain: Ventricles, cisterns and other CSF spaces are normal. There is no mass, mass effect, shift of midline structures or acute hemorrhage. No evidence of acute infarction. Vascular: No hyperdense vessel or unexpected calcification. Skull: Normal. Negative for fracture or focal lesion. Sinuses/Orbits: No acute finding. Other: None. IMPRESSION: No acute findings. Electronically Signed   By: 05/28/2011 M.D.   On: 05/13/2020 11:30   DG Chest Port 1 View  Result Date: 05/12/2020 CLINICAL DATA:  COVID positive shortness of breath EXAM: PORTABLE CHEST 1 VIEW COMPARISON:  May 03, 2020  FINDINGS: The heart size and mediastinal contours are unchanged with mild cardiomegaly. Aortic knob calcifications. Interval significant worsening in the multifocal patchy hazy airspace opacities throughout both lungs, right worse than left. No pleural effusion. No acute osseous abnormality. Cervical fixation hardware seen. IMPRESSION: Interval worsening in the multifocal airspace opacities consistent with pneumonia. Electronically Signed   By: Jonna Clark M.D.   On: 05/12/2020 22:26   DG Chest Port 1 View  Result Date: 05/03/2020 CLINICAL DATA:  Shortness of breath.  Hypoxia.  COVID-19 positive. EXAM: PORTABLE CHEST 1 VIEW COMPARISON:  Chest radiograph 10/31/2019 and CTA 11/09/2019 FINDINGS: The cardiac silhouette remains mildly enlarged. Aortic atherosclerosis is noted. There is persistent mild elevation of the right hemidiaphragm. Compared to the prior radiograph, there is increased pulmonary vascular congestion with mild interstitial densities bilaterally. No sizable pleural effusion or pneumothorax is identified. Prior cervical fusion is noted. IMPRESSION: Cardiomegaly with pulmonary vascular congestion and mild bilateral interstitial opacities which may reflect edema or atypical/viral infection. Electronically Signed   By: Sebastian Ache M.D.   On: 05/03/2020 15:42   DG HIP UNILAT WITH PELVIS 2-3 VIEWS  LEFT  Result Date: 05/13/2020 CLINICAL DATA:  Fall. EXAM: DG HIP (WITH OR WITHOUT PELVIS) 2-3V LEFT COMPARISON:  10/20/2016 FINDINGS: Left hip arthroplasty intact and unchanged. Moderate osteoarthritic change of the right hip. Stable irregularity over the inferior left acetabular region. Degenerate change of the spine with partially visualized fusion hardware intact over the lower lumbar spine. Mild degenerate change of the sacroiliac joints. IMPRESSION: 1. No acute findings. 2. Moderate osteoarthritic change of the right hip. Left hip arthroplasty intact. Electronically Signed   By: Elberta Fortis M.D.   On: 05/13/2020 11:28   SLEEP STUDY DOCUMENTS  Result Date: 04/25/2020 Ordered by an unspecified provider.  SLEEP STUDY DOCUMENTS  Result Date: 04/18/2020 Ordered by an unspecified provider.

## 2020-05-17 NOTE — Plan of Care (Signed)

## 2020-05-18 LAB — COMPREHENSIVE METABOLIC PANEL
ALT: 44 U/L (ref 0–44)
AST: 35 U/L (ref 15–41)
Albumin: 2.8 g/dL — ABNORMAL LOW (ref 3.5–5.0)
Alkaline Phosphatase: 70 U/L (ref 38–126)
Anion gap: 10 (ref 5–15)
BUN: 12 mg/dL (ref 8–23)
CO2: 34 mmol/L — ABNORMAL HIGH (ref 22–32)
Calcium: 9.4 mg/dL (ref 8.9–10.3)
Chloride: 85 mmol/L — ABNORMAL LOW (ref 98–111)
Creatinine, Ser: 0.61 mg/dL (ref 0.44–1.00)
GFR calc Af Amer: >60 mL/min (ref >60)
GFR calc non Af Amer: >60 mL/min (ref >60)
Glucose, Bld: 206 mg/dL — ABNORMAL HIGH (ref 70–99)
Potassium: 5 mmol/L (ref 3.5–5.1)
Sodium: 129 mmol/L — ABNORMAL LOW (ref 135–145)
Total Bilirubin: 0.8 mg/dL (ref 0.3–1.2)
Total Protein: 6.5 g/dL (ref 6.5–8.1)

## 2020-05-18 LAB — GLUCOSE, CAPILLARY
Glucose-Capillary: 175 mg/dL — ABNORMAL HIGH (ref 70–99)
Glucose-Capillary: 195 mg/dL — ABNORMAL HIGH (ref 70–99)
Glucose-Capillary: 210 mg/dL — ABNORMAL HIGH (ref 70–99)
Glucose-Capillary: 242 mg/dL — ABNORMAL HIGH (ref 70–99)

## 2020-05-18 LAB — CBC WITH DIFFERENTIAL/PLATELET
Abs Immature Granulocytes: 0.03 10*3/uL (ref 0.00–0.07)
Basophils Absolute: 0 10*3/uL (ref 0.0–0.1)
Basophils Relative: 0 %
Eosinophils Absolute: 0 10*3/uL (ref 0.0–0.5)
Eosinophils Relative: 0 %
HCT: 32.4 % — ABNORMAL LOW (ref 36.0–46.0)
Hemoglobin: 11 g/dL — ABNORMAL LOW (ref 12.0–15.0)
Immature Granulocytes: 1 %
Lymphocytes Relative: 9 %
Lymphs Abs: 0.5 10*3/uL — ABNORMAL LOW (ref 0.7–4.0)
MCH: 27.8 pg (ref 26.0–34.0)
MCHC: 34 g/dL (ref 30.0–36.0)
MCV: 82 fL (ref 80.0–100.0)
Monocytes Absolute: 0.2 10*3/uL (ref 0.1–1.0)
Monocytes Relative: 4 %
Neutro Abs: 4.8 10*3/uL (ref 1.7–7.7)
Neutrophils Relative %: 86 %
Platelets: 304 10*3/uL (ref 150–400)
RBC: 3.95 MIL/uL (ref 3.87–5.11)
RDW: 15 % (ref 11.5–15.5)
WBC: 5.5 10*3/uL (ref 4.0–10.5)
nRBC: 0 % (ref 0.0–0.2)

## 2020-05-18 MED ORDER — FUROSEMIDE 40 MG PO TABS
40.0000 mg | ORAL_TABLET | Freq: Every day | ORAL | Status: DC
  Administered 2020-05-18: 40 mg via ORAL
  Filled 2020-05-18: qty 1

## 2020-05-18 MED ORDER — METHYLPREDNISOLONE SODIUM SUCC 40 MG IJ SOLR
40.0000 mg | Freq: Two times a day (BID) | INTRAMUSCULAR | Status: DC
  Administered 2020-05-18 – 2020-05-20 (×4): 40 mg via INTRAVENOUS
  Filled 2020-05-18 (×4): qty 1

## 2020-05-18 NOTE — Progress Notes (Signed)
Nutrition Follow Up   DOCUMENTATION CODES:   Morbid obesity  INTERVENTION:    Ensure Enlive po BID, each supplement provides 350 kcal and 20 grams of protein  MVI daily   NUTRITION DIAGNOSIS:   Increased nutrient needs related to acute illness (COVID 19) as evidenced by estimated needs.  GOAL:   Patient will meet greater than or equal to 90% of their needs  MONITOR:   PO intake, Supplement acceptance, Weight trends, Labs, I & O's  REASON FOR ASSESSMENT:   Malnutrition Screening Tool    ASSESSMENT:   Patient with PMH significant for CAD, DM, HTN, and OSA. Presents this admission with COVID 19 PNA.   On 3-4 L O2, titrating FiO2 down. Meal completions lack documentation. Per nurse pt eating well and taking Ensure BID.   Plan possible d/c in next day or so.   Admission weight: 169.8 kg  Current weight: 163.2 kg   Medications: 500 mg Vitamin C, 40 mg daily, SS novolog, levemir, solumedrol, MVI with minerals, 220 mg zinc sulfate  Labs: Na 129 (L) CBG 175-296  Diet Order:   Diet Order            Diet heart healthy/carb modified Room service appropriate? Yes; Fluid consistency: Thin  Diet effective now                 EDUCATION NEEDS:   Education needs have been addressed  Skin:  Skin Assessment: Reviewed RN Assessment  Last BM:  8/20  Height:   Ht Readings from Last 1 Encounters:  05/12/20 5\' 4"  (1.626 m)    Weight:   Wt Readings from Last 1 Encounters:  05/18/20 (!) 163.2 kg    BMI:  Body mass index is 61.76 kg/m.  Estimated Nutritional Needs:   Kcal:  1900-2100 kcal  Protein:  95-105 grams  Fluid:  >/= 1.9 L/day   05/20/20 RD, LDN Clinical Nutrition Pager listed in AMION

## 2020-05-18 NOTE — Progress Notes (Signed)
PROGRESS NOTE                                                                                                                                                                                                             Patient Demographics:    Amy Barnes, is a 69 y.o. female, DOB - 10-Jul-1951, WSF:[id number]  Outpatient Primary MD for the patient is [name], MD   Admit date - 05/12/2020   LOS - 6  Chief Complaint  Patient presents with  . Shortness of Breath       Brief Narrative: Patient is a 69 y.o. 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  See below for further details.  COVID-19 vaccinated status: Unvaccinated  Significant Events: 8/5-8/10>> hospitalized for hypoxia secondary to COVID-19-discharged on room air. 8/14>> presented back to the ED with shortness of breath-readmitted for hypoxia secondary to COVID-19 pneumonitis  Significant studies: 8/14>>Chest x-ray: Interval worsening in the multifocal airspace opacities consistent with pneumonia 8/15>> x-ray hip: No acute findings-moderate OA changes of the right hip, left hip arthroplasty intact. 8/15>> CT head: No acute findings  COVID-19 medications: Steroids: 8/14>> Remdesivir: 8/14>> 8/19  Antibiotics: Doxycycline-resumed in the hospital-on chronically for chronic MRSA infection in back hardware.  Microbiology data: 8/14 blood culture: No growth   Procedures: None  Consults: None  DVT prophylaxis: Prophylactic Lovenox at daily dosing.    Subjective:   Continues to  improve-still on 3-4 L of oxygen.  No major issues overnight.  Denies any chest pain.   Assessment  & Plan :   Acute Hypoxic Resp Failure due to Covid 19 Viral pneumonia: Slowly improving-stable on around 3-4 L of oxygen-continue to attempt to slowly titrate down FiO2.  Continue Solu-Medrol-start tapering.  Will likely require home O2.  Suspect that if oxygen can be tapered down further-she should be stable for discharge in the next day or so.  Fever: afebrile  O2 requirements:  SpO2: 92 % O2 Flow Rate (L/min): 3 L/min   COVID-19 Labs: Recent Labs    05/16/20 0715 05/17/20  0213  DDIMER 1.19* 2.68*  FERRITIN 268 226  CRP 7.3* 11.8*       Component Value Date/Time   BNP 139.5 (H) 05/16/2020 0715    Recent Labs  Lab 05/12/20 2227  PROCALCITON <0.10    Lab Results  Component Value Date   SARSCOV2NAA POSITIVE (A) 05/03/2020   SARSCOV2NAA NEGATIVE 01/21/2020   SARSCOV2NAA NEGATIVE 12/23/2019   SARSCOV2NAA NEGATIVE 11/09/2019     Prone/Incentive Spirometry: encouraged incentive spirometry use 3-4/hour.  Hyponatremia: Likely secondary to hypervolemia-sodium improved after 1 dose of IV Lasix yesterday-starting oral Lasix today-lower extremity edema has improved.    History of nonischemic cardiomyopathy: Volume status relatively stable-except for more lower extremity edema today-continue oral Lasix starting today.  Echo on 5/24 showed normalization of EF.  HTN: BP controlled-continue Coreg-continue to hold losartan/hydralazine.  HLD: Continue statin  DM-2: CBG stable-but need to watch closely as steroid dosage has been excluded.  Continue Levemir 10 units twice daily, SSI and Tradjenta.  Follow and adjust.    Recent Labs    05/17/20 2129 05/18/20 0718 05/18/20 1205  GLUCAP 197* 175* 195*    Hypothyroidism: Continue Synthroid  GERD: Continue PPI  Depression/bipolar disorder: Stable-continue Prozac/Lamictal  Chronic MRSA infection and back hardware: On  suppressive doxycycline.  OSA: CPAP nightly  Fall/debility/deconditioning: Secondary to acute illness with COVID-19-CT head without any abnormalities, left hip x-ray without any acute bony findings.  Suspect has some significant amount of debility at baseline-normally walks with a cane/walker at home.  PT OT following with recommendations for home health services on discharge.  Nutrition Problem: Nutrition Problem: Increased nutrient needs Etiology: acute illness (COVID 19) Signs/Symptoms: estimated needs Interventions: Ensure Enlive (each supplement provides 350kcal and 20 grams of protein), MVI  Morbid Obesity: Estimated body mass index is 61.76 kg/m as calculated from the following:   Height as of this encounter:  (1.626 m).   Weight as of this encounter: 163.2 kg.    ABG: No results found for: PHART, PCO2ART, PO2ART, HCO3, TCO2, ACIDBASEDEF, O2SAT  Vent Settings: N/A  Condition - Stable  Family Communication  :  Daughter [name] [phone]) updated over the phone 8/20  Code Status :  Full Code  Diet :  Diet Order            Diet heart healthy/carb modified Room service appropriate? Yes; Fluid consistency: Thin  Diet effective now                  Disposition Plan  :   Status is: Inpatient  Remains inpatient appropriate because:Inpatient level of care appropriate due to severity of illness   Dispo: The patient is from: Home              Anticipated d/c is to: Home              Anticipated d/c date is: 2 days              Patient currently is not medically stable to d/c.   Barriers to discharge: Hypoxia requiring O2 supplementation/complete 5 days of IV Remdesivir  Antimicorbials  :    Anti-infectives (From admission, onward)   Start     Dose/Rate Route Frequency Ordered Stop   05/14/20 1000  remdesivir 100 mg in sodium chloride 0.9 % 100 mL IVPB       "Followed by" Linked Group Details   100 mg 200 mL/hr over 30 Minutes Intravenous Daily 05/13/20  0015 05/17/20 1000   05/13/20 0130  doxycycline (VIBRA-TABS) tablet 100 mg        100 mg Oral 2 times daily 05/13/20 0113     05/13/20 0100  remdesivir 200 mg in sodium chloride 0.9% 250 mL IVPB       "Followed by" Linked Group Details   200 mg 580 mL/hr over 30 Minutes Intravenous Once 05/13/20 0015 05/13/20 0507      Inpatient Medications  Scheduled Meds: . ascorbic acid  500 mg Oral Daily  . aspirin EC  81 mg Oral QHS  . baricitinib  4 mg Oral Daily  . carvedilol  6.25 mg Oral BID WC  . cholecalciferol  1,000 Units Oral Daily  . dicyclomine  20 mg Oral BID  . doxycycline  100 mg Oral BID  . enoxaparin (LOVENOX) injection  85 mg Subcutaneous Q24H  . feeding supplement (ENSURE ENLIVE)  237 mL Oral BID BM  . furosemide  40 mg Oral Daily  . guaiFENesin  600 mg Oral BID  . insulin aspart  0-9 Units Subcutaneous TID PC & HS  . insulin detemir  10 Units Subcutaneous BID  . lamoTRIgine  200 mg Oral Daily  . levothyroxine  150 mcg Oral QHS  . linagliptin  5 mg Oral Daily  . methylPREDNISolone (SOLU-MEDROL) injection  60 mg Intravenous Q12H  . montelukast  10 mg Oral QHS  . multivitamin with minerals  1 tablet Oral Daily  . OXcarbazepine  300 mg Oral Daily   And  . OXcarbazepine  600 mg Oral QHS  . pantoprazole  40 mg Oral Daily  . simvastatin  40 mg Oral QPM  . zinc sulfate  220 mg Oral Daily   Continuous Infusions: . sodium chloride Stopped (05/18/20 0100)   PRN Meds:.acetaminophen, albuterol, guaiFENesin-dextromethorphan, lip balm, magnesium hydroxide, polyethylene glycol, prochlorperazine, sodium chloride, traZODone, zolpidem   Time Spent in minutes  25  See all Orders from today for further details   Jeoffrey Massed M.D on 05/18/2020 at 1:44 PM  To page go to [email or web address] - use universal password  [facility] -  Office  [phone]    Objective:   Vitals:   05/18/20 0511 05/18/20 0558 05/18/20 0644 05/18/20 0721  BP:    (!) 151/72  Pulse:    76    Resp:    20  Temp:    98.7 F (37.1 C)  TempSrc:      SpO2: 94%  93% 92%  Weight:  (!) 163.2 kg    Height:        Wt Readings from Last 3 Encounters:  05/18/20 (!) 163.2 kg  05/08/20 (!) 169.8 kg  04/15/20 (!) 170.1 kg     Intake/Output Summary (Last 24 hours) at 05/18/2020 1344 Last data filed at 05/18/2020 0600 Gross per 24 hour  Intake 1858.68 ml  Output 2400 ml  Net -541.32 ml     Physical Exam Gen Exam:Alert awake-not in any distress HEENT:atraumatic, normocephalic Chest: B/L clear to auscultation anteriorly CVS:S1S2 regular Abdomen:soft non tender, non distended Extremities:+ edema Neurology: Non focal Skin: no rash   Data Review:    CBC Recent Labs  Lab 05/14/20 0228 05/15/20 0108 05/16/20 0715 05/17/20 0213 05/18/20 0159  WBC 5.0 6.2 9.9 8.8 5.5  HGB 10.5* 10.5* 11.0* 10.5* 11.0*  HCT 32.0* 32.6* 34.2* 32.3* 32.4*  PLT 219 284 342 301 304  MCV 83.1 83.4 84.7 83.0 82.0  MCH 27.3 26.9 27.2 27.0 27.8  MCHC 32.8 32.2 32.2 32.5 34.0  RDW 15.5 15.8* 15.9*  15.8* 15.0  LYMPHSABS 0.6* 0.7 1.6 1.1 0.5*  MONOABS 0.8 0.6 1.1* 0.7 0.2  EOSABS 0.0 0.0 0.0 0.0 0.0  BASOSABS 0.0 0.0 0.0 0.0 0.0    Chemistries  Recent Labs  Lab 05/14/20 0228 05/15/20 0108 05/16/20 0715 05/17/20 0213 05/18/20 0853  NA 125* 125* 129* 126* 129*  K 4.3 4.8 4.9 4.8 5.0  CL 90* 92* 90* 88* 85*  CO2 25 25 31 30  34*  GLUCOSE 141* 199* 77 113* 206*  BUN 16 26* 19 15 12   CREATININE 0.74 0.72 0.66 0.39* 0.61  CALCIUM 8.4* 9.1 9.3 9.0 9.4  AST 38 50* 38 36 35  ALT 36 47* 46* 43 44  ALKPHOS 64 64 65 65 70  BILITOT 0.9 0.3 0.6 0.5 0.8   ------------------------------------------------------------------------------------------------------------------ No results for input(s): CHOL, HDL, LDLCALC, TRIG, CHOLHDL, LDLDIRECT in the last 72 hours.  Lab Results  Component Value Date   HGBA1C 7.3 (H) 05/05/2020    ------------------------------------------------------------------------------------------------------------------ No results for input(s): TSH, T4TOTAL, T3FREE, THYROIDAB in the last 72 hours.  Invalid input(s): FREET3 ------------------------------------------------------------------------------------------------------------------ Recent Labs    05/16/20 0715 05/17/20 0213  FERRITIN 268 226    Coagulation profile No results for input(s): INR, PROTIME in the last 168 hours.  Recent Labs    05/16/20 0715 05/17/20 0213  DDIMER 1.19* 2.68*    Cardiac Enzymes No results for input(s): CKMB, TROPONINI, MYOGLOBIN in the last 168 hours.  Invalid input(s): CK ------------------------------------------------------------------------------------------------------------------    Component Value Date/Time   BNP 139.5 (H) 05/16/2020 0715    Micro Results Recent Results (from the past 240 hour(s))  Blood Culture (routine x 2)     Status: None   Collection Time: 05/12/20 10:33 PM   Specimen: BLOOD  Result Value Ref Range Status   Specimen Description BLOOD RIGHT FOREARM  Final   Special Requests   Final    BOTTLES DRAWN AEROBIC AND ANAEROBIC Blood Culture adequate volume   Culture   Final    NO GROWTH 5 DAYS Performed at [facility], [address] [place], [address] [place]    Report Status 05/17/2020 FINAL  Final    Radiology Reports CT HEAD WO CONTRAST  Result Date: 05/13/2020 CLINICAL DATA:  Fall with facial trauma. EXAM: CT HEAD WITHOUT CONTRAST TECHNIQUE: Contiguous axial images were obtained from the base of the skull through the vertex without intravenous contrast. COMPARISON:  05/26/2011 FINDINGS: Brain: Ventricles, cisterns and other CSF spaces are normal. There is no mass, mass effect, shift of midline structures or acute hemorrhage. No evidence of acute infarction. Vascular: No hyperdense vessel or unexpected calcification. Skull: Normal. Negative for  fracture or focal lesion. Sinuses/Orbits: No acute finding. Other: None. IMPRESSION: No acute findings. Electronically Signed   By: 05/15/2020 M.D.   On: 05/13/2020 11:30   DG Chest Port 1 View  Result Date: 05/12/2020 CLINICAL DATA:  COVID positive shortness of breath EXAM: PORTABLE CHEST 1 VIEW COMPARISON:  May 03, 2020 FINDINGS: The heart size and mediastinal contours are unchanged with mild cardiomegaly. Aortic knob calcifications. Interval significant worsening in the multifocal patchy hazy airspace opacities throughout both lungs, right worse than left. No pleural effusion. No acute osseous abnormality. Cervical fixation hardware seen. IMPRESSION: Interval worsening in the multifocal airspace opacities consistent with pneumonia. Electronically Signed   By: 05/14/2020 M.D.   On: 05/12/2020 22:26   DG Chest Port 1 View  Result Date: 05/03/2020 CLINICAL DATA:  Shortness of breath.  Hypoxia.  COVID-19  positive. EXAM: PORTABLE CHEST 1 VIEW COMPARISON:  Chest radiograph 10/31/2019 and CTA 11/09/2019 FINDINGS: The cardiac silhouette remains mildly enlarged. Aortic atherosclerosis is noted. There is persistent mild elevation of the right hemidiaphragm. Compared to the prior radiograph, there is increased pulmonary vascular congestion with mild interstitial densities bilaterally. No sizable pleural effusion or pneumothorax is identified. Prior cervical fusion is noted. IMPRESSION: Cardiomegaly with pulmonary vascular congestion and mild bilateral interstitial opacities which may reflect edema or atypical/viral infection. Electronically Signed   By: Sebastian Ache M.D.   On: 05/03/2020 15:42   DG HIP UNILAT WITH PELVIS 2-3 VIEWS LEFT  Result Date: 05/13/2020 CLINICAL DATA:  Fall. EXAM: DG HIP (WITH OR WITHOUT PELVIS) 2-3V LEFT COMPARISON:  10/20/2016 FINDINGS: Left hip arthroplasty intact and unchanged. Moderate osteoarthritic change of the right hip. Stable irregularity over the inferior left acetabular  region. Degenerate change of the spine with partially visualized fusion hardware intact over the lower lumbar spine. Mild degenerate change of the sacroiliac joints. IMPRESSION: 1. No acute findings. 2. Moderate osteoarthritic change of the right hip. Left hip arthroplasty intact. Electronically Signed   By: Elberta Fortis M.D.   On: 05/13/2020 11:28   SLEEP STUDY DOCUMENTS  Result Date: 04/25/2020 Ordered by an unspecified provider.  SLEEP STUDY DOCUMENTS  Result Date: 04/18/2020 Ordered by an unspecified provider.

## 2020-05-18 NOTE — Progress Notes (Addendum)
Please see in addition to PT note:   SATURATION QUALIFICATIONS: (This note is used to comply with regulatory documentation for home oxygen)  Patient Saturations on Room Air at Rest = 85%  Patient Saturations on Room Air while Ambulating = DNT due to rapid and sustained desaturation at rest on room air; needed 2LPM O2 to recover to >90% at rest   Patient Saturations on 2 Liters of oxygen while Ambulating = 83% on 2LPM O2; needed increase to 3LPM to improve to >90% even with coached PLB and extended time provided to attempt resaturation. Still desaturated to 84% with ambulation on 3LPM but able to recover to >90% with PLB/rest.   Please briefly explain why patient needs home oxygen:rapid desaturation with difficulty recovering on room air; at this time will likely need 3LPM to maintain saturations with activity  Madelaine Etienne, DPT, PN1   Supplemental Physical Therapist [facility]    Pager [phone] Acute Rehab Office [phone]

## 2020-05-18 NOTE — Plan of Care (Signed)
Discussed with patient plan of care for the evening, pain management, medications and importance of deep breathing with some teach back displayed

## 2020-05-18 NOTE — Progress Notes (Signed)
Physical Therapy Treatment Patient Details Name: Amy Barnes MRN: [id number] DOB: 06-Oct-1950 Today's Date: 05/18/2020    History of Present Illness Amy Barnes  is a 69 y.o. Caucasian female with a known history of coronary artery disease, type 2 diabetes mellitus, hypertension, dyslipidemia, hypothyroidism and obstructive sleep apnea as well as recent COVID-19 for which she was admitted here and discharged 5 days ago on room air, who presents to the emergency room with acute onset of worsening dyspnea with hypoxemia.  Her pulse currently was 83% on room air.      PT Comments    Patient received in bed, pleasant and cooperative and feeling better in general today. Able to complete all mobility on a supervision level with bariatric RW, also tolerated an increase in activity today and was able to gait train 80ftx2 in her room with improved gait speed and safety. Continues to desaturate- see accompanying O2 note for details. Needed less standing rest breaks today, also had less violent coughing fits. Did need 3LPM consistently with activity due to rapid desat and difficulty resaturating on 2LPM. Left up in the recliner with all needs met, RN aware of patient status. Will continue to follow acutely.    Follow Up Recommendations  Home health PT;Other (comment) (working towards no followup)     Equipment Recommendations  None recommended by PT    Recommendations for Other Services       Precautions / Restrictions Precautions Precautions: Fall;Other (comment) Precaution Comments: COVID+, monitor O2 Restrictions Weight Bearing Restrictions: No    Mobility  Bed Mobility Overal bed mobility: Needs Assistance Bed Mobility: Supine to Sit     Supine to sit: Supervision     General bed mobility comments: able to get herself to EOB with S with HOB almost flat, use of bed rails  Transfers Overall transfer level: Needs assistance Equipment used: Rolling walker (2 wheeled) Transfers: Sit  to/from Stand Sit to Stand: Supervision Stand pivot transfers: Supervision       General transfer comment: S for safety, no physical assist given, good awareness of safety and hand placement  Ambulation/Gait Ambulation/Gait assistance: Supervision Gait Distance (Feet): 20 Feet (x2) Assistive device: Rolling walker (2 wheeled) (bariatric) Gait Pattern/deviations: Step-through pattern;Trunk flexed;Wide base of support Gait velocity: decreased   General Gait Details: increased speed with RW today, much safer use of device and tolerated increased gait distance with less SOB/WOB and less coughing; see accompanying O2 note for SPO2 details   Stairs             Wheelchair Mobility    Modified Rankin (Stroke Patients Only)       Balance Overall balance assessment: Needs assistance;History of Falls Sitting-balance support: No upper extremity supported;Feet supported Sitting balance-Amy Barnes: Good     Standing balance support: Bilateral upper extremity supported;During functional activity Standing balance-Amy Barnes: Fair Standing balance comment: reliant on BUE support, balance improvnig                            Cognition Arousal/Alertness: Awake/alert Behavior During Therapy: WFL for tasks assessed/performed Overall Cognitive Status: Within Functional Limits for tasks assessed                                 General Comments: pleasant and cooperative      Exercises      General Comments General comments (skin integrity, edema, etc.):  see accompanying O2 note for saturations      Pertinent Vitals/Pain Pain Assessment: No/denies pain Pain Score: 0-No pain Pain Intervention(s): Limited activity within patient's tolerance;Monitored during session    Home Living                      Prior Function            PT Goals (current goals can now be found in the care plan section) Acute Rehab PT Goals Patient Stated Goal: be  able to take care of myself PT Goal Formulation: With patient Time For Goal Achievement: 05/28/20 Potential to Achieve Goals: Good Progress towards PT goals: Progressing toward goals    Frequency    Min 3X/week      PT Plan Current plan remains appropriate    Co-evaluation              AM-PAC PT "6 Clicks" Mobility   Outcome Measure  Help needed turning from your back to your side while in a flat bed without using bedrails?: A Little Help needed moving from lying on your back to sitting on the side of a flat bed without using bedrails?: A Little Help needed moving to and from a bed to a chair (including a wheelchair)?: A Little Help needed standing up from a chair using your arms (e.g., wheelchair or bedside chair)?: A Little Help needed to walk in hospital room?: A Little Help needed climbing 3-5 steps with a railing? : A Little 6 Click Score: 18    End of Session Equipment Utilized During Treatment: Oxygen Activity Tolerance: Patient tolerated treatment well Patient left: in chair;with call bell/phone within reach Nurse Communication: Mobility status;Other (comment) (O2 sats and need for new purewick) PT Visit Diagnosis: Unsteadiness on feet (R26.81);Difficulty in walking, not elsewhere classified (R26.2);Muscle weakness (generalized) (M62.81)     Time: 1030-1057 PT Time Calculation (min) (ACUTE ONLY): 27 min  Charges:  $Gait Training: 8-22 mins $Therapeutic Activity: 8-22 mins                     Windell Norfolk, DPT, PN1   Supplemental Physical Therapist [place]    Pager [phone] Acute Rehab Office [phone]

## 2020-05-19 LAB — BASIC METABOLIC PANEL
Anion gap: 8 (ref 5–15)
Anion gap: 12 (ref 5–15)
BUN: 20 mg/dL (ref 8–23)
BUN: 21 mg/dL (ref 8–23)
CO2: 31 mmol/L (ref 22–32)
CO2: 27 mmol/L (ref 22–32)
Calcium: 9 mg/dL (ref 8.9–10.3)
Calcium: 9.1 mg/dL (ref 8.9–10.3)
Chloride: 84 mmol/L — ABNORMAL LOW (ref 98–111)
Chloride: 82 mmol/L — ABNORMAL LOW (ref 98–111)
Creatinine, Ser: 0.59 mg/dL (ref 0.44–1.00)
Creatinine, Ser: 0.71 mg/dL (ref 0.44–1.00)
GFR calc Af Amer: >60 mL/min (ref >60)
GFR calc Af Amer: >60 mL/min (ref >60)
GFR calc non Af Amer: >60 mL/min (ref >60)
GFR calc non Af Amer: >60 mL/min (ref >60)
Glucose, Bld: 140 mg/dL — ABNORMAL HIGH (ref 70–99)
Glucose, Bld: 248 mg/dL — ABNORMAL HIGH (ref 70–99)
Potassium: 5.2 mmol/L — ABNORMAL HIGH (ref 3.5–5.1)
Potassium: 5.2 mmol/L — ABNORMAL HIGH (ref 3.5–5.1)
Sodium: 123 mmol/L — ABNORMAL LOW (ref 135–145)
Sodium: 121 mmol/L — ABNORMAL LOW (ref 135–145)

## 2020-05-19 LAB — GLUCOSE, CAPILLARY
Glucose-Capillary: 145 mg/dL — ABNORMAL HIGH (ref 70–99)
Glucose-Capillary: 139 mg/dL — ABNORMAL HIGH (ref 70–99)
Glucose-Capillary: 163 mg/dL — ABNORMAL HIGH (ref 70–99)
Glucose-Capillary: 228 mg/dL — ABNORMAL HIGH (ref 70–99)
Glucose-Capillary: 277 mg/dL — ABNORMAL HIGH (ref 70–99)

## 2020-05-19 LAB — COMPREHENSIVE METABOLIC PANEL
ALT: 56 U/L — ABNORMAL HIGH (ref 0–44)
AST: 48 U/L — ABNORMAL HIGH (ref 15–41)
Albumin: 2.7 g/dL — ABNORMAL LOW (ref 3.5–5.0)
Alkaline Phosphatase: 69 U/L (ref 38–126)
Anion gap: 9 (ref 5–15)
BUN: 21 mg/dL (ref 8–23)
CO2: 30 mmol/L (ref 22–32)
Calcium: 9 mg/dL (ref 8.9–10.3)
Chloride: 84 mmol/L — ABNORMAL LOW (ref 98–111)
Creatinine, Ser: 0.67 mg/dL (ref 0.44–1.00)
GFR calc Af Amer: >60 mL/min (ref >60)
GFR calc non Af Amer: >60 mL/min (ref >60)
Glucose, Bld: 195 mg/dL — ABNORMAL HIGH (ref 70–99)
Potassium: 5.1 mmol/L (ref 3.5–5.1)
Sodium: 123 mmol/L — ABNORMAL LOW (ref 135–145)
Total Bilirubin: 0.3 mg/dL (ref 0.3–1.2)
Total Protein: 5.9 g/dL — ABNORMAL LOW (ref 6.5–8.1)

## 2020-05-19 LAB — OSMOLALITY: Osmolality: 270 mOsm/kg — ABNORMAL LOW (ref 275–295)

## 2020-05-19 LAB — C-REACTIVE PROTEIN: CRP: 7.5 mg/dL — ABNORMAL HIGH (ref <1.0)

## 2020-05-19 LAB — D-DIMER, QUANTITATIVE: D-Dimer, Quant: 1.53 ug/mL-FEU — ABNORMAL HIGH (ref 0.00–0.50)

## 2020-05-19 LAB — FERRITIN: Ferritin: 261 ng/mL (ref 11–307)

## 2020-05-19 LAB — OSMOLALITY, URINE: Osmolality, Ur: 854 mOsm/kg (ref 300–900)

## 2020-05-19 LAB — SODIUM, URINE, RANDOM: Sodium, Ur: 29 mmol/L

## 2020-05-19 MED ORDER — SODIUM CHLORIDE 0.9 % IV SOLN: INTRAVENOUS | Status: DC

## 2020-05-19 MED ORDER — FUROSEMIDE 10 MG/ML IJ SOLN
40.0000 mg | Freq: Once | INTRAMUSCULAR | Status: AC
  Administered 2020-05-19: 40 mg via INTRAVENOUS
  Filled 2020-05-19: qty 4

## 2020-05-19 MED ORDER — MENTHOL 3 MG MT LOZG
1.0000 | LOZENGE | OROMUCOSAL | Status: DC | PRN
  Administered 2020-05-19 – 2020-05-21 (×3): 3 mg via ORAL
  Filled 2020-05-19 (×2): qty 9

## 2020-05-19 NOTE — Progress Notes (Signed)
PROGRESS NOTE                                                                                                                                                                                                             Patient Demographics:    Amy Barnes, is a 69 y.o. female, DOB - 1951/08/06, XLK:[id number]  Outpatient Primary MD for the patient is [name], MD   Admit date - 05/12/2020   LOS - 7  Chief Complaint  Patient presents with  . Shortness of Breath       Brief Narrative: Patient is a 69 y.o. 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  See below for further details.  COVID-19 vaccinated status: Unvaccinated  Significant Events: 8/5-8/10>> hospitalized for hypoxia secondary to COVID-19-discharged on room air. 8/14>> presented back to the ED with shortness of breath-readmitted for hypoxia secondary to COVID-19 pneumonitis  Significant studies: 8/14>>Chest x-ray: Interval worsening in the multifocal airspace opacities consistent with pneumonia 8/15>> x-ray hip: No acute findings-moderate OA changes of the right hip, left hip arthroplasty intact. 8/15>> CT head: No acute findings  COVID-19 medications: Steroids: 8/14>> Remdesivir: 8/14>> 8/19  Antibiotics: Doxycycline-resumed in the hospital-on chronically for chronic MRSA infection in back hardware.  Microbiology data: 8/14 blood culture: No growth   Procedures: None  Consults: None  DVT prophylaxis: Prophylactic Lovenox at daily dosing.    Subjective:   Lying  comfortably in bed-no major issues overnight.   Assessment  & Plan :   Acute Hypoxic Resp Failure due to Covid 19 Viral pneumonia: Much better-stable on 3 L of oxygen.  Continue to attempt to slowly titrate down FiO2-remains on steroids-we will taper down over the next few days.  Will likely require home O2 on discharge.   Fever: afebrile O2 requirements:  SpO2: 96 % O2 Flow Rate (L/min): 3 L/min   COVID-19 Labs: Recent Labs    05/17/20 0213 05/19/20 0138  DDIMER 2.68* 1.53*  FERRITIN 226 261  CRP 11.8* 7.5*       Component Value Date/Time  BNP 139.5 (H) 05/16/2020 0715    Recent Labs  Lab 05/12/20 2227  PROCALCITON <0.10    Lab Results  Component Value Date   SARSCOV2NAA POSITIVE (A) 05/03/2020   SARSCOV2NAA NEGATIVE 01/21/2020   SARSCOV2NAA NEGATIVE 12/23/2019   SARSCOV2NAA NEGATIVE 11/09/2019     Prone/Incentive Spirometry: encouraged incentive spirometry use 3-4/hour.  Hyponatremia: Likely secondary to hypervolemia-sodium improved after diuretic regimen for 2 days but then has dropped today.  Stop Lasix-gently hydrate-lower extremity edema has resolved.  Follow electrolytes.  If worsens significantly-then we can contemplate further work-up-including discontinuing some of her medications that possibly could contribute to SIADH physiology.    History of nonischemic cardiomyopathy: Volume status relatively stable-lower extremity edema has resolved.  Holding Lasix-see above regarding hyponatremia.  Echo on 5/24 showed normalization of EF.  HTN: BP controlled-continue Coreg-continue to hold losartan/hydralazine.  HLD: Continue statin  DM-2: CBG stable-but need to watch closely as steroid dosage has been excluded.  Continue Levemir 10 units twice daily, SSI and Tradjenta.  Follow and adjust.    Recent Labs    05/18/20 2245 05/19/20 0741 05/19/20 0928  GLUCAP 242* 145* 139*    Hypothyroidism: Continue Synthroid  GERD: Continue PPI  Depression/bipolar  disorder: Stable-continue Prozac/Lamictal  Chronic MRSA infection and back hardware: On suppressive doxycycline.  OSA: CPAP nightly  Fall/debility/deconditioning: Secondary to acute illness with COVID-19-CT head without any abnormalities, left hip x-ray without any acute bony findings.  Suspect has some significant amount of debility at baseline-normally walks with a cane/walker at home.  PT OT following with recommendations for home health services on discharge.  Nutrition Problem: Nutrition Problem: Increased nutrient needs Etiology: acute illness (COVID 19) Signs/Symptoms: estimated needs Interventions: Ensure Enlive (each supplement provides 350kcal and 20 grams of protein), MVI  Morbid Obesity: Estimated body mass index is 62.02 kg/m as calculated from the following:   Height as of this encounter: 5\' 4"  (1.626 m).   Weight as of this encounter: 163.9 kg.    ABG: No results found for: PHART, PCO2ART, PO2ART, HCO3, TCO2, ACIDBASEDEF, O2SAT  Vent Settings: N/A  Condition - Stable  Family Communication  :  Daughter [phone]) updated over the phone 8/21  Code Status :  Full Code  Diet :  Diet Order            Diet heart healthy/carb modified Room service appropriate? Yes; Fluid consistency: Thin  Diet effective now                  Disposition Plan  :   Status is: Inpatient  Remains inpatient appropriate because:Inpatient level of care appropriate due to severity of illness   Dispo: The patient is from: Home              Anticipated d/c is to: Home              Anticipated d/c date is: 2 days              Patient currently is not medically stable to d/c.   Barriers to discharge: Hypoxia requiring O2 supplementation/worsening hyponatremia  Antimicorbials  :    Anti-infectives (From admission, onward)   Start     Dose/Rate Route Frequency Ordered Stop   05/14/20 1000  remdesivir 100 mg in sodium chloride 0.9 % 100 mL IVPB       "Followed by" Linked  Group Details   100 mg 200 mL/hr over 30 Minutes Intravenous Daily 05/13/20 0015 05/17/20 1000   05/13/20 0130  doxycycline (VIBRA-TABS) tablet 100 mg        100 mg Oral 2 times daily 05/13/20 0113     05/13/20 0100  remdesivir 200 mg in sodium chloride 0.9% 250 mL IVPB       "Followed by" Linked Group Details   200 mg 580 mL/hr over 30 Minutes Intravenous Once 05/13/20 0015 05/13/20 0507      Inpatient Medications  Scheduled Meds: . ascorbic acid  500 mg Oral Daily  . aspirin EC  81 mg Oral QHS  . baricitinib  4 mg Oral Daily  . carvedilol  6.25 mg Oral BID WC  . cholecalciferol  1,000 Units Oral Daily  . dicyclomine  20 mg Oral BID  . doxycycline  100 mg Oral BID  . enoxaparin (LOVENOX) injection  85 mg Subcutaneous Q24H  . feeding supplement (ENSURE ENLIVE)  237 mL Oral BID BM  . guaiFENesin  600 mg Oral BID  . insulin aspart  0-9 Units Subcutaneous TID PC & HS  . insulin detemir  10 Units Subcutaneous BID  . lamoTRIgine  200 mg Oral Daily  . levothyroxine  150 mcg Oral QHS  . linagliptin  5 mg Oral Daily  . methylPREDNISolone (SOLU-MEDROL) injection  40 mg Intravenous Q12H  . montelukast  10 mg Oral QHS  . multivitamin with minerals  1 tablet Oral Daily  . OXcarbazepine  300 mg Oral Daily   And  . OXcarbazepine  600 mg Oral QHS  . pantoprazole  40 mg Oral Daily  . simvastatin  40 mg Oral QPM  . zinc sulfate  220 mg Oral Daily   Continuous Infusions: . sodium chloride Stopped (05/18/20 0100)  . sodium chloride     PRN Meds:.acetaminophen, albuterol, guaiFENesin-dextromethorphan, lip balm, magnesium hydroxide, polyethylene glycol, prochlorperazine, sodium chloride, traZODone, zolpidem   Time Spent in minutes  25  See all Orders from today for further details   Jeoffrey Massed M.D on 05/19/2020 at 10:27 AM  To page go to [email or web address] - use universal password  [facility] -  Office  [phone]    Objective:   Vitals:   05/19/20 0200 05/19/20 0400  05/19/20 0614 05/19/20 0745  BP:  123/63  138/76  Pulse:  (!) 57  63  Resp:  20 18 16   Temp:  98 F (36.7 C)  97.6 F (36.4 C)  TempSrc:  Oral    SpO2: 94% 95% 95% 96%  Weight:  (!) 163.9 kg    Height:        Wt Readings from Last 3 Encounters:  05/19/20 (!) 163.9 kg  05/08/20 (!) 169.8 kg  04/15/20 (!) 170.1 kg     Intake/Output Summary (Last 24 hours) at 05/19/2020 1027 Last data filed at 05/19/2020 0404 Gross per 24 hour  Intake 480 ml  Output 1100 ml  Net -620 ml     Physical Exam Gen Exam:Alert awake-not in any distress HEENT:atraumatic, normocephalic Chest: B/L clear to auscultation anteriorly CVS:S1S2 regular Abdomen:soft non tender, non distended Extremities:no edema Neurology: Non focal Skin: no rash   Data Review:    CBC Recent Labs  Lab 05/14/20 0228 05/15/20 0108 05/16/20 0715 05/17/20 0213 05/18/20 0159  WBC 5.0 6.2 9.9 8.8 5.5  HGB 10.5* 10.5* 11.0* 10.5* 11.0*  HCT 32.0* 32.6* 34.2* 32.3* 32.4*  PLT 219 284 342 301 304  MCV 83.1 83.4 84.7 83.0 82.0  MCH 27.3 26.9 27.2 27.0 27.8  MCHC 32.8 32.2 32.2 32.5 34.0  RDW 15.5 15.8* 15.9*  15.8* 15.0  LYMPHSABS 0.6* 0.7 1.6 1.1 0.5*  MONOABS 0.8 0.6 1.1* 0.7 0.2  EOSABS 0.0 0.0 0.0 0.0 0.0  BASOSABS 0.0 0.0 0.0 0.0 0.0    Chemistries  Recent Labs  Lab 05/15/20 0108 05/15/20 0108 05/16/20 0715 05/17/20 0213 05/18/20 0853 05/19/20 0138 05/19/20 0924  NA 125*   < > 129* 126* 129* 123* 123*  K 4.8   < > 4.9 4.8 5.0 5.1 5.2*  CL 92*   < > 90* 88* 85* 84* 84*  CO2 25   < > 31 30 34* 30 31  GLUCOSE 199*   < > 77 113* 206* 195* 140*  BUN 26*   < > 19 15 12 21 20   CREATININE 0.72   < > 0.66 0.39* 0.61 0.67 0.59  CALCIUM 9.1   < > 9.3 9.0 9.4 9.0 9.0  AST 50*  --  38 36 35 48*  --   ALT 47*  --  46* 43 44 56*  --   ALKPHOS 64  --  65 65 70 69  --   BILITOT 0.3  --  0.6 0.5 0.8 0.3  --    < > = values in this interval not displayed.    ------------------------------------------------------------------------------------------------------------------ No results for input(s): CHOL, HDL, LDLCALC, TRIG, CHOLHDL, LDLDIRECT in the last 72 hours.  Lab Results  Component Value Date   HGBA1C 7.3 (H) 05/05/2020   ------------------------------------------------------------------------------------------------------------------ No results for input(s): TSH, T4TOTAL, T3FREE, THYROIDAB in the last 72 hours.  Invalid input(s): FREET3 ------------------------------------------------------------------------------------------------------------------ Recent Labs    05/17/20 0213 05/19/20 0138  FERRITIN 226 261    Coagulation profile No results for input(s): INR, PROTIME in the last 168 hours.  Recent Labs    05/17/20 0213 05/19/20 0138  DDIMER 2.68* 1.53*    Cardiac Enzymes No results for input(s): CKMB, TROPONINI, MYOGLOBIN in the last 168 hours.  Invalid input(s): CK ------------------------------------------------------------------------------------------------------------------    Component Value Date/Time   BNP 139.5 (H) 05/16/2020 0715    Micro Results Recent Results (from the past 240 hour(s))  Blood Culture (routine x 2)     Status: None   Collection Time: 05/12/20 10:33 PM   Specimen: BLOOD  Result Value Ref Range Status   Specimen Description BLOOD RIGHT FOREARM  Final   Special Requests   Final    BOTTLES DRAWN AEROBIC AND ANAEROBIC Blood Culture adequate volume   Culture   Final    NO GROWTH 5 DAYS Performed at [facility], [address] [place], [place]    Report Status 05/17/2020 FINAL  Final    Radiology Reports CT HEAD WO CONTRAST  Result Date: 05/13/2020 CLINICAL DATA:  Fall with facial trauma. EXAM: CT HEAD WITHOUT CONTRAST TECHNIQUE: Contiguous axial images were obtained from the base of the skull through the vertex without intravenous contrast. COMPARISON:  05/26/2011  FINDINGS: Brain: Ventricles, cisterns and other CSF spaces are normal. There is no mass, mass effect, shift of midline structures or acute hemorrhage. No evidence of acute infarction. Vascular: No hyperdense vessel or unexpected calcification. Skull: Normal. Negative for fracture or focal lesion. Sinuses/Orbits: No acute finding. Other: None. IMPRESSION: No acute findings. Electronically Signed   By: 05/28/2011 M.D.   On: 05/13/2020 11:30   DG Chest Port 1 View  Result Date: 05/12/2020 CLINICAL DATA:  COVID positive shortness of breath EXAM: PORTABLE CHEST 1 VIEW COMPARISON:  May 03, 2020 FINDINGS: The heart size and mediastinal contours are unchanged with  mild cardiomegaly. Aortic knob calcifications. Interval significant worsening in the multifocal patchy hazy airspace opacities throughout both lungs, right worse than left. No pleural effusion. No acute osseous abnormality. Cervical fixation hardware seen. IMPRESSION: Interval worsening in the multifocal airspace opacities consistent with pneumonia. Electronically Signed   By: Jonna Clark M.D.   On: 05/12/2020 22:26   DG Chest Port 1 View  Result Date: 05/03/2020 CLINICAL DATA:  Shortness of breath.  Hypoxia.  COVID-19 positive. EXAM: PORTABLE CHEST 1 VIEW COMPARISON:  Chest radiograph 10/31/2019 and CTA 11/09/2019 FINDINGS: The cardiac silhouette remains mildly enlarged. Aortic atherosclerosis is noted. There is persistent mild elevation of the right hemidiaphragm. Compared to the prior radiograph, there is increased pulmonary vascular congestion with mild interstitial densities bilaterally. No sizable pleural effusion or pneumothorax is identified. Prior cervical fusion is noted. IMPRESSION: Cardiomegaly with pulmonary vascular congestion and mild bilateral interstitial opacities which may reflect edema or atypical/viral infection. Electronically Signed   By: Sebastian Ache M.D.   On: 05/03/2020 15:42   DG HIP UNILAT WITH PELVIS 2-3 VIEWS  LEFT  Result Date: 05/13/2020 CLINICAL DATA:  Fall. EXAM: DG HIP (WITH OR WITHOUT PELVIS) 2-3V LEFT COMPARISON:  10/20/2016 FINDINGS: Left hip arthroplasty intact and unchanged. Moderate osteoarthritic change of the right hip. Stable irregularity over the inferior left acetabular region. Degenerate change of the spine with partially visualized fusion hardware intact over the lower lumbar spine. Mild degenerate change of the sacroiliac joints. IMPRESSION: 1. No acute findings. 2. Moderate osteoarthritic change of the right hip. Left hip arthroplasty intact. Electronically Signed   By: Elberta Fortis M.D.   On: 05/13/2020 11:28   SLEEP STUDY DOCUMENTS  Result Date: 04/25/2020 Ordered by an unspecified provider.

## 2020-05-19 NOTE — Plan of Care (Signed)
Discussed with patient plan of care for the evening, pain management and trying to use bedside commode prior to getting back in bed to promote a bowel movement with some teach back displayed

## 2020-05-20 LAB — COMPREHENSIVE METABOLIC PANEL
ALT: 62 U/L — ABNORMAL HIGH (ref 0–44)
AST: 44 U/L — ABNORMAL HIGH (ref 15–41)
Albumin: 2.7 g/dL — ABNORMAL LOW (ref 3.5–5.0)
Alkaline Phosphatase: 63 U/L (ref 38–126)
Anion gap: 12 (ref 5–15)
BUN: 20 mg/dL (ref 8–23)
CO2: 31 mmol/L (ref 22–32)
Calcium: 9 mg/dL (ref 8.9–10.3)
Chloride: 82 mmol/L — ABNORMAL LOW (ref 98–111)
Creatinine, Ser: 0.63 mg/dL (ref 0.44–1.00)
GFR calc Af Amer: >60 mL/min (ref >60)
GFR calc non Af Amer: >60 mL/min (ref >60)
Glucose, Bld: 173 mg/dL — ABNORMAL HIGH (ref 70–99)
Potassium: 5.2 mmol/L — ABNORMAL HIGH (ref 3.5–5.1)
Sodium: 125 mmol/L — ABNORMAL LOW (ref 135–145)
Total Bilirubin: 0.5 mg/dL (ref 0.3–1.2)
Total Protein: 5.9 g/dL — ABNORMAL LOW (ref 6.5–8.1)

## 2020-05-20 LAB — BASIC METABOLIC PANEL
Anion gap: 12 (ref 5–15)
BUN: 31 mg/dL — ABNORMAL HIGH (ref 8–23)
CO2: 30 mmol/L (ref 22–32)
Calcium: 9.6 mg/dL (ref 8.9–10.3)
Chloride: 82 mmol/L — ABNORMAL LOW (ref 98–111)
Creatinine, Ser: 0.81 mg/dL (ref 0.44–1.00)
GFR calc Af Amer: >60 mL/min (ref >60)
GFR calc non Af Amer: >60 mL/min (ref >60)
Glucose, Bld: 218 mg/dL — ABNORMAL HIGH (ref 70–99)
Potassium: 5 mmol/L (ref 3.5–5.1)
Sodium: 124 mmol/L — ABNORMAL LOW (ref 135–145)

## 2020-05-20 LAB — GLUCOSE, CAPILLARY
Glucose-Capillary: 163 mg/dL — ABNORMAL HIGH (ref 70–99)
Glucose-Capillary: 188 mg/dL — ABNORMAL HIGH (ref 70–99)
Glucose-Capillary: 276 mg/dL — ABNORMAL HIGH (ref 70–99)

## 2020-05-20 LAB — C-REACTIVE PROTEIN: CRP: 4.6 mg/dL — ABNORMAL HIGH (ref <1.0)

## 2020-05-20 LAB — FERRITIN: Ferritin: 221 ng/mL (ref 11–307)

## 2020-05-20 LAB — D-DIMER, QUANTITATIVE: D-Dimer, Quant: 1.48 ug/mL-FEU — ABNORMAL HIGH (ref 0.00–0.50)

## 2020-05-20 MED ORDER — FUROSEMIDE 10 MG/ML IJ SOLN
40.0000 mg | Freq: Once | INTRAMUSCULAR | Status: AC
  Administered 2020-05-20: 40 mg via INTRAVENOUS
  Filled 2020-05-20: qty 4

## 2020-05-20 MED ORDER — OXCARBAZEPINE 300 MG PO TABS
300.0000 mg | ORAL_TABLET | Freq: Every day | ORAL | Status: DC
  Filled 2020-05-20: qty 1

## 2020-05-20 MED ORDER — OXCARBAZEPINE 300 MG PO TABS: 300.0000 mg | ORAL_TABLET | Freq: Every day | ORAL | Status: DC

## 2020-05-20 MED ORDER — OXCARBAZEPINE 300 MG PO TABS
600.0000 mg | ORAL_TABLET | Freq: Every day | ORAL | Status: DC
  Administered 2020-05-21: 600 mg via ORAL

## 2020-05-20 MED ORDER — PREDNISONE 20 MG PO TABS: 40.0000 mg | ORAL_TABLET | Freq: Every day | ORAL | Status: DC

## 2020-05-20 MED ORDER — OXCARBAZEPINE 300 MG PO TABS
600.0000 mg | ORAL_TABLET | Freq: Every day | ORAL | Status: DC
  Filled 2020-05-20: qty 2

## 2020-05-20 NOTE — Plan of Care (Signed)
Discussed with patient plan of care, pain management and ambulating with out exertion with some teach back displayed.

## 2020-05-20 NOTE — Progress Notes (Signed)
PROGRESS NOTE                                                                                                                                                                                                             Patient Demographics:    Amy Barnes, is a 69 y.o. female, DOB - 29-Dec-1950, XAJ:[id number]  Outpatient Primary MD for the patient is [name], MD   Admit date - 05/12/2020   LOS - 8  Chief Complaint  Patient presents with   Shortness of Breath       Brief Narrative: Patient is a 69 y.o. 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  Hypoxia slowly improved with steroids and other supportive measures-however hospital course then complicated by hyponatremia.  See below for further details.  COVID-19 vaccinated status: Unvaccinated  Significant Events: 8/5-8/10>> hospitalized for hypoxia secondary to COVID-19-discharged on room air. 8/14>> presented back to the ED with shortness of breath-readmitted for hypoxia secondary to COVID-19 pneumonitis  Significant studies: 8/14>>Chest x-ray: Interval worsening in the multifocal airspace opacities consistent with pneumonia 8/15>> x-ray hip: No acute findings-moderate OA changes of the right hip, left hip arthroplasty intact. 8/15>> CT head: No acute findings  COVID-19 medications: Steroids: 8/14>> Remdesivir: 8/14>> 8/19  Antibiotics: Doxycycline-resumed in the hospital-on chronically for chronic MRSA infection in back hardware.  Microbiology data: 8/14 blood culture: No growth    Procedures: None  Consults: None  DVT prophylaxis: Prophylactic Lovenox at daily dosing.    Subjective:   No major issues overnight-stable on just 2-3 L of oxygen.  Breathing is stable.   Assessment  & Plan :   Acute Hypoxic Resp Failure due to Covid 19 Viral pneumonia: Much better-stable on 3 L of oxygen.  Continue to attempt to slowly titrate down FiO2-remains on steroids-but on a taper.  Will likely require home O2 on discharge.   Fever: afebrile O2 requirements:  SpO2: 98 % O2 Flow Rate (L/min): 3 L/min   COVID-19 Labs: Recent Labs    05/19/20 0138 05/20/20 0142  DDIMER 1.53* 1.48*  FERRITIN 261 221  CRP 7.5* 4.6*       Component Value Date/Time   BNP 139.5 (H) 05/16/2020 0715    No results for input(s): PROCALCITON in the last 168 hours.  Lab Results  Component Value Date   SARSCOV2NAA POSITIVE (A) 05/03/2020   SARSCOV2NAA NEGATIVE 01/21/2020   SARSCOV2NAA NEGATIVE 12/23/2019   SARSCOV2NAA NEGATIVE 11/09/2019     Prone/Incentive Spirometry: encouraged incentive spirometry use 3-4/hour.  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  Hold carbamazepine-resume Lasix-placed on fluid restriction.  Case was discussed with nephrologist-Dr. Valentino Nose on 8/21.  Continue to follow electrolytes closely.  She is asymptomatic.  Hyperkalemia: Mild-we will be on Lasix-repeat electrolytes tomorrow.  History of nonischemic cardiomyopathy: Volume status relatively stable-lower extremity edema has resolved.  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  Echo on 5/24 showed normalization of EF.  HTN: BP controlled-continue Coreg-continue to hold losartan/hydralazine.  HLD: Continue statin  DM-2: CBG stable-but need to watch closely as steroid dosage has been tapered down-continue Levemir 10 units twice daily, SSI and Tradjenta.  Follow and adjust.   Recent Labs    05/19/20 2005 05/20/20 0749 05/20/20 1121   GLUCAP 277* 163* 188*    Hypothyroidism: Continue Synthroid  GERD: Continue PPI  Depression/bipolar disorder: Stable-hold Trileptal for a few days given concern for SIADH-remains on Lamictal.  Will need to consult psychiatry at some point for medication management-reassess tomorrow.  Chronic MRSA infection and back hardware: On suppressive doxycycline.  OSA: CPAP nightly  Fall/debility/deconditioning: Secondary to acute illness with COVID-19-CT head without any abnormalities, left hip x-ray without any acute bony findings.  Suspect has some significant amount of debility at baseline-normally walks with a cane/walker at home.  PT OT following with recommendations for home health services on discharge.  Nutrition Problem: Nutrition Problem: Increased nutrient needs Etiology: acute illness (COVID 19) Signs/Symptoms: estimated needs Interventions: Ensure Enlive (each supplement provides 350kcal and 20 grams of protein), MVI  Morbid Obesity: Estimated body mass index is 62.89 kg/m as calculated from the following:   Height as of this encounter: 5\' 4"  (1.626 m).   Weight as of this encounter: 166.2 kg.    ABG: No results found for: PHART, PCO2ART, PO2ART, HCO3, TCO2, ACIDBASEDEF, O2SAT  Vent Settings: N/A  Condition - Stable  Family Communication  :  Daughter [phone]) updated over the phone 8/22  Code Status :  Full Code  Diet :  Diet Order            Diet heart healthy/carb modified Room service appropriate? Yes; Fluid consistency: Thin; Fluid restriction: 1200 mL Fluid  Diet effective now                  Disposition Plan  :   Status is: Inpatient  Remains inpatient appropriate because:Inpatient level of care appropriate due to severity of illness   Dispo: The patient is from: Home              Anticipated d/c is to: Home              Anticipated d/c date is: 2 days              Patient currently is not medically stable to d/c.   Barriers to  discharge: Hypoxia requiring O2 supplementation/worsening hyponatremia  Antimicorbials  :    Anti-infectives (From admission, onward)   Start     Dose/Rate Route Frequency Ordered Stop   05/14/20 1000  remdesivir 100 mg in sodium chloride 0.9 % 100 mL IVPB       "Followed by" Linked Group Details   100 mg 200 mL/hr over 30 Minutes Intravenous Daily 05/13/20 0015 05/17/20 1000   05/13/20 0130  doxycycline (VIBRA-TABS) tablet 100 mg        100 mg Oral 2 times daily 05/13/20 0113     05/13/20 0100  remdesivir 200 mg in sodium chloride 0.9% 250 mL IVPB       "Followed by" Linked Group Details   200 mg 580 mL/hr over 30 Minutes Intravenous Once 05/13/20 0015 05/13/20 0507      Inpatient Medications  Scheduled Meds:  ascorbic acid  500 mg Oral Daily   aspirin EC  81 mg Oral QHS   baricitinib  4 mg Oral Daily   carvedilol  6.25 mg Oral BID WC   cholecalciferol  1,000 Units Oral Daily   dicyclomine  20 mg Oral BID   doxycycline  100 mg Oral BID   enoxaparin (LOVENOX) injection  85 mg Subcutaneous Q24H   feeding supplement (ENSURE ENLIVE)  237 mL Oral BID BM   guaiFENesin  600 mg Oral BID   insulin aspart  0-9 Units Subcutaneous TID PC & HS   insulin detemir  10 Units Subcutaneous BID   lamoTRIgine  200 mg Oral Daily   levothyroxine  150 mcg Oral QHS   linagliptin  5 mg Oral Daily   montelukast  10 mg Oral QHS   multivitamin with minerals  1 tablet Oral Daily   [START ON 05/23/2020] OXcarbazepine  300 mg Oral Daily   And   [START ON 05/23/2020] OXcarbazepine  600 mg Oral QHS   pantoprazole  40 mg Oral Daily   [START ON 05/21/2020] predniSONE  40 mg Oral Q breakfast   simvastatin  40 mg Oral QPM   zinc sulfate  220 mg Oral Daily   Continuous Infusions:  sodium chloride 10 mL/hr at 05/20/20 0650   PRN Meds:.acetaminophen, albuterol, guaiFENesin-dextromethorphan, lip balm, magnesium hydroxide, menthol-cetylpyridinium, polyethylene glycol, prochlorperazine,  sodium chloride, traZODone, zolpidem   Time Spent in minutes  25  See all Orders from today for further details   Jeoffrey Massed M.D on 05/20/2020 at 2:42 PM  To page go to [email or web address] - use universal password  [facility] -  Office  [phone]    Objective:   Vitals:   05/20/20 0231 05/20/20 0300 05/20/20 0500 05/20/20 0750  BP:    (!) 147/73  Pulse:    67  Resp:    16  Temp:      TempSrc:      SpO2:  93% 93% 98%  Weight: (!) 166.2 kg     Height:        Wt Readings from Last 3 Encounters:  05/20/20 (!) 166.2 kg  05/08/20 (!) 169.8 kg  04/15/20 (!) 170.1 kg     Intake/Output Summary (Last 24 hours) at 05/20/2020 1442 Last data filed at 05/20/2020 0300 Gross per 24 hour  Intake 741.69 ml  Output 1950 ml  Net -1208.31 ml     Physical Exam Gen Exam:Alert awake-not in any distress HEENT:atraumatic, normocephalic Chest: B/L clear to auscultation anteriorly CVS:S1S2 regular Abdomen:soft non tender, non distended Extremities:no edema Neurology: Non focal Skin: no rash   Data Review:    CBC Recent Labs  Lab 05/14/20 0228 05/15/20 0108 05/16/20 0715 05/17/20 0213 05/18/20 0159  WBC 5.0 6.2 9.9 8.8 5.5  HGB 10.5* 10.5* 11.0* 10.5*  11.0*  HCT 32.0* 32.6* 34.2* 32.3* 32.4*  PLT 219 284 342 301 304  MCV 83.1 83.4 84.7 83.0 82.0  MCH 27.3 26.9 27.2 27.0 27.8  MCHC 32.8 32.2 32.2 32.5 34.0  RDW 15.5 15.8* 15.9* 15.8* 15.0  LYMPHSABS 0.6* 0.7 1.6 1.1 0.5*  MONOABS 0.8 0.6 1.1* 0.7 0.2  EOSABS 0.0 0.0 0.0 0.0 0.0  BASOSABS 0.0 0.0 0.0 0.0 0.0    Chemistries  Recent Labs  Lab 05/16/20 0715 05/16/20 0715 05/17/20 0213 05/17/20 0213 05/18/20 0853 05/19/20 0138 05/19/20 0924 05/19/20 1753 05/20/20 0728  NA 129*   < > 126*   < > 129* 123* 123* 121* 125*  K 4.9   < > 4.8   < > 5.0 5.1 5.2* 5.2* 5.2*  CL 90*   < > 88*   < > 85* 84* 84* 82* 82*  CO2 31   < > 30   < > 34* 30 31 27 31   GLUCOSE 77   < > 113*   < > 206* 195* 140* 248* 173*    BUN 19   < > 15   < > 12 21 20 21 20   CREATININE 0.66   < > 0.39*   < > 0.61 0.67 0.59 0.71 0.63  CALCIUM 9.3   < > 9.0   < > 9.4 9.0 9.0 9.1 9.0  AST 38  --  36  --  35 48*  --   --  44*  ALT 46*  --  43  --  44 56*  --   --  62*  ALKPHOS 65  --  65  --  70 69  --   --  63  BILITOT 0.6  --  0.5  --  0.8 0.3  --   --  0.5   < > = values in this interval not displayed.   ------------------------------------------------------------------------------------------------------------------ No results for input(s): CHOL, HDL, LDLCALC, TRIG, CHOLHDL, LDLDIRECT in the last 72 hours.  Lab Results  Component Value Date   HGBA1C 7.3 (H) 05/05/2020   ------------------------------------------------------------------------------------------------------------------ No results for input(s): TSH, T4TOTAL, T3FREE, THYROIDAB in the last 72 hours.  Invalid input(s): FREET3 ------------------------------------------------------------------------------------------------------------------ Recent Labs    05/19/20 0138 05/20/20 0142  FERRITIN 261 221    Coagulation profile No results for input(s): INR, PROTIME in the last 168 hours.  Recent Labs    05/19/20 0138 05/20/20 0142  DDIMER 1.53* 1.48*    Cardiac Enzymes No results for input(s): CKMB, TROPONINI, MYOGLOBIN in the last 168 hours.  Invalid input(s): CK ------------------------------------------------------------------------------------------------------------------    Component Value Date/Time   BNP 139.5 (H) 05/16/2020 0715    Micro Results Recent Results (from the past 240 hour(s))  Blood Culture (routine x 2)     Status: None   Collection Time: 05/12/20 10:33 PM   Specimen: BLOOD  Result Value Ref Range Status   Specimen Description BLOOD RIGHT FOREARM  Final   Special Requests   Final    BOTTLES DRAWN AEROBIC AND ANAEROBIC Blood Culture adequate volume   Culture   Final    NO GROWTH 5 DAYS Performed at [facility], [address], [place], [address] [place]    Report Status 05/17/2020 FINAL  Final    Radiology Reports CT HEAD WO CONTRAST  Result Date: 05/13/2020 CLINICAL DATA:  Fall with facial trauma. EXAM: CT HEAD WITHOUT CONTRAST TECHNIQUE: Contiguous axial images were obtained from the base of the skull through the vertex without intravenous  contrast. COMPARISON:  05/26/2011 FINDINGS: Brain: Ventricles, cisterns and other CSF spaces are normal. There is no mass, mass effect, shift of midline structures or acute hemorrhage. No evidence of acute infarction. Vascular: No hyperdense vessel or unexpected calcification. Skull: Normal. Negative for fracture or focal lesion. Sinuses/Orbits: No acute finding. Other: None. IMPRESSION: No acute findings. Electronically Signed   By: Elberta Fortis M.D.   On: 05/13/2020 11:30   DG Chest Port 1 View  Result Date: 05/12/2020 CLINICAL DATA:  COVID positive shortness of breath EXAM: PORTABLE CHEST 1 VIEW COMPARISON:  May 03, 2020 FINDINGS: The heart size and mediastinal contours are unchanged with mild cardiomegaly. Aortic knob calcifications. Interval significant worsening in the multifocal patchy hazy airspace opacities throughout both lungs, right worse than left. No pleural effusion. No acute osseous abnormality. Cervical fixation hardware seen. IMPRESSION: Interval worsening in the multifocal airspace opacities consistent with pneumonia. Electronically Signed   By: Jonna Clark M.D.   On: 05/12/2020 22:26   DG Chest Port 1 View  Result Date: 05/03/2020 CLINICAL DATA:  Shortness of breath.  Hypoxia.  COVID-19 positive. EXAM: PORTABLE CHEST 1 VIEW COMPARISON:  Chest radiograph 10/31/2019 and CTA 11/09/2019 FINDINGS: The cardiac silhouette remains mildly enlarged. Aortic atherosclerosis is noted. There is persistent mild elevation of the right hemidiaphragm. Compared to the prior radiograph, there is increased pulmonary vascular congestion with mild interstitial  densities bilaterally. No sizable pleural effusion or pneumothorax is identified. Prior cervical fusion is noted. IMPRESSION: Cardiomegaly with pulmonary vascular congestion and mild bilateral interstitial opacities which may reflect edema or atypical/viral infection. Electronically Signed   By: Sebastian Ache M.D.   On: 05/03/2020 15:42   DG HIP UNILAT WITH PELVIS 2-3 VIEWS LEFT  Result Date: 05/13/2020 CLINICAL DATA:  Fall. EXAM: DG HIP (WITH OR WITHOUT PELVIS) 2-3V LEFT COMPARISON:  10/20/2016 FINDINGS: Left hip arthroplasty intact and unchanged. Moderate osteoarthritic change of the right hip. Stable irregularity over the inferior left acetabular region. Degenerate change of the spine with partially visualized fusion hardware intact over the lower lumbar spine. Mild degenerate change of the sacroiliac joints. IMPRESSION: 1. No acute findings. 2. Moderate osteoarthritic change of the right hip. Left hip arthroplasty intact. Electronically Signed   By: Elberta Fortis M.D.   On: 05/13/2020 11:28   SLEEP STUDY DOCUMENTS  Result Date: 04/25/2020 Ordered by an unspecified provider.

## 2020-05-21 LAB — COMPREHENSIVE METABOLIC PANEL
ALT: 66 U/L — ABNORMAL HIGH (ref 0–44)
AST: 41 U/L (ref 15–41)
Albumin: 3 g/dL — ABNORMAL LOW (ref 3.5–5.0)
Alkaline Phosphatase: 64 U/L (ref 38–126)
Anion gap: 12 (ref 5–15)
BUN: 31 mg/dL — ABNORMAL HIGH (ref 8–23)
CO2: 32 mmol/L (ref 22–32)
Calcium: 9.5 mg/dL (ref 8.9–10.3)
Chloride: 82 mmol/L — ABNORMAL LOW (ref 98–111)
Creatinine, Ser: 0.79 mg/dL (ref 0.44–1.00)
GFR calc Af Amer: >60 mL/min (ref >60)
GFR calc non Af Amer: >60 mL/min (ref >60)
Glucose, Bld: 153 mg/dL — ABNORMAL HIGH (ref 70–99)
Potassium: 4.7 mmol/L (ref 3.5–5.1)
Sodium: 126 mmol/L — ABNORMAL LOW (ref 135–145)
Total Bilirubin: 0.7 mg/dL (ref 0.3–1.2)
Total Protein: 6.2 g/dL — ABNORMAL LOW (ref 6.5–8.1)

## 2020-05-21 LAB — GLUCOSE, CAPILLARY
Glucose-Capillary: 162 mg/dL — ABNORMAL HIGH (ref 70–99)
Glucose-Capillary: 171 mg/dL — ABNORMAL HIGH (ref 70–99)
Glucose-Capillary: 209 mg/dL — ABNORMAL HIGH (ref 70–99)
Glucose-Capillary: 268 mg/dL — ABNORMAL HIGH (ref 70–99)
Glucose-Capillary: 83 mg/dL (ref 70–99)

## 2020-05-21 LAB — C-REACTIVE PROTEIN: CRP: 2.4 mg/dL — ABNORMAL HIGH (ref <1.0)

## 2020-05-21 LAB — D-DIMER, QUANTITATIVE: D-Dimer, Quant: 1.25 ug/mL-FEU — ABNORMAL HIGH (ref 0.00–0.50)

## 2020-05-21 LAB — FERRITIN: Ferritin: 206 ng/mL (ref 11–307)

## 2020-05-21 MED ORDER — PREDNISONE 20 MG PO TABS
30.0000 mg | ORAL_TABLET | Freq: Every day | ORAL | Status: DC
  Administered 2020-05-22: 30 mg via ORAL
  Filled 2020-05-21: qty 1

## 2020-05-21 MED ORDER — FENTANYL CITRATE (PF) 100 MCG/2ML IJ SOLN
25.0000 ug | INTRAMUSCULAR | Status: AC | PRN
  Administered 2020-05-21 – 2020-05-22 (×2): 25 ug via INTRAVENOUS
  Filled 2020-05-21 (×2): qty 2

## 2020-05-21 MED ORDER — OXYMETAZOLINE HCL 0.05 % NA SOLN
1.0000 | Freq: Two times a day (BID) | NASAL | Status: DC | PRN
  Filled 2020-05-21: qty 30

## 2020-05-21 MED ORDER — SODIUM CHLORIDE 0.9 % IV BOLUS
250.0000 mL | Freq: Once | INTRAVENOUS | Status: AC
  Administered 2020-05-21: 250 mL via INTRAVENOUS

## 2020-05-21 MED ORDER — FUROSEMIDE 10 MG/ML IJ SOLN
40.0000 mg | Freq: Two times a day (BID) | INTRAMUSCULAR | Status: DC
  Administered 2020-05-21 (×2): 40 mg via INTRAVENOUS
  Filled 2020-05-21 (×2): qty 4

## 2020-05-21 MED ORDER — PREDNISONE 20 MG PO TABS
40.0000 mg | ORAL_TABLET | Freq: Every day | ORAL | Status: DC
  Administered 2020-05-21: 40 mg via ORAL
  Filled 2020-05-21: qty 2

## 2020-05-21 NOTE — Progress Notes (Signed)
Obtained room number husband Melvyn Neth "Rosanne Ashing" Skeet is in room [phone] and he arrived from cath lab safe.  2H CN RN called and updated me about husband's condition and information passed onto his wife.

## 2020-05-21 NOTE — Plan of Care (Signed)
Discussed with patient plan of care for the evening, pain management and bedtime to use bathroom before returning to bed with teach back displayed.  Patient current up in chair.

## 2020-05-21 NOTE — Progress Notes (Signed)
PROGRESS NOTE                                                                                                                                                                                                             Patient Demographics:    Arneshia Ade, is a 69 y.o. female, DOB - 05-24-1951, XNA:[id number]  Outpatient Primary MD for the patient is [name], MD   Admit date - 05/12/2020   LOS - 9  Chief Complaint  Patient presents with  . Shortness of Breath       Brief Narrative: Patient is a 69 y.o. 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  Hypoxia slowly improved with steroids and other supportive measures-however hospital course then complicated by hyponatremia.  See below for further details.  COVID-19 vaccinated status: Unvaccinated  Significant Events: 8/5-8/10>> hospitalized for hypoxia secondary to COVID-19-discharged on room air. 8/14>> presented back to the ED with shortness of breath-readmitted for hypoxia secondary to COVID-19 pneumonitis  Significant studies: 8/14>>Chest x-ray: Interval worsening in the multifocal airspace opacities consistent with pneumonia 8/15>> x-ray hip: No acute findings-moderate OA changes of the right hip, left hip arthroplasty intact. 8/15>> CT head: No acute findings  COVID-19 medications: Steroids: 8/14>> Remdesivir: 8/14>> 8/19  Antibiotics: Doxycycline-resumed in the hospital-on chronically for chronic MRSA infection in back hardware.  Microbiology data: 8/14 blood culture: No growth    Procedures: None  Consults: None  DVT prophylaxis: Prophylactic Lovenox at daily dosing.    Subjective:   No major issues overnight-no shortness of breath.   Assessment  & Plan :   Acute Hypoxic Resp Failure due to Covid 19 Viral pneumonia: Much better-Down to 2 L of oxygen.  Continued attempts to slowly titrate down FiO2.  Continue to taper down prednisone.  Likely will need home O2 on discharge.    Fever: afebrile O2 requirements:  SpO2: 97 % O2 Flow Rate (L/min): 2 L/min   COVID-19 Labs: Recent Labs    05/19/20 0138 05/20/20 0142 05/21/20 0059  DDIMER 1.53* 1.48* 1.25*  FERRITIN 261  221 206  CRP 7.5* 4.6* 2.4*       Component Value Date/Time   BNP 139.5 (H) 05/16/2020 0715    No results for input(s): PROCALCITON in the last 168 hours.  Lab Results  Component Value Date   SARSCOV2NAA POSITIVE (A) 05/03/2020   SARSCOV2NAA NEGATIVE 01/21/2020   SARSCOV2NAA NEGATIVE 12/23/2019   SARSCOV2NAA NEGATIVE 11/09/2019     Prone/Incentive Spirometry: encouraged incentive spirometry use 3-4/hour.  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  Continue to hold carbamazepine-continue IV Lasix and fluid restriction.  Sodium slowly improving.  Continue to follow closely-Case was discussed with nephrology Dr. Darla Lesches on 8/21.  Hyperkalemia: Resolved.  History of nonischemic cardiomyopathy: Volume status relatively stable-lower extremity edema has resolved.  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  Echo on 5/24 showed normalization of EF.  HTN: BP controlled-continue Coreg-continue to hold losartan/hydralazine.  HLD: Continue statin  DM-2: CBG stable-but need to watch closely as steroid dosage has been tapered down-continue Levemir 10 units twice daily, SSI and Tradjenta.  Follow and adjust.   Recent Labs    05/21/20 0003 05/21/20 0723 05/21/20 1215  GLUCAP 171* 83 162*    Hypothyroidism:  Continue Synthroid  GERD: Continue PPI  Depression/bipolar disorder: Remains stable-Trileptal held for [facility] consulted psychiatry-for medication management-as daughter claims that she was finally stabilized on Trileptal-and has been on it for the past several years.  Remains on Lamictal.   Chronic MRSA infection and back hardware: On suppressive doxycycline.  OSA: CPAP nightly  Fall/debility/deconditioning: Secondary to acute illness with COVID-19-CT head without any abnormalities, left hip x-ray without any acute bony findings.  Suspect has some significant amount of debility at baseline-normally walks with a cane/walker at home.  PT OT following with recommendations for home health services on discharge.  Nutrition Problem: Nutrition Problem: Increased nutrient needs Etiology: acute illness (COVID 19) Signs/Symptoms: estimated needs Interventions: Ensure Enlive (each supplement provides 350kcal and 20 grams of protein), MVI  Morbid Obesity: Estimated body mass index is 61.49 kg/m as calculated from the following:   Height as of this encounter:  (1.626 m).   Weight as of this encounter: 162.5 kg.    ABG: No results found for: PHART, PCO2ART, PO2ART, HCO3, TCO2, ACIDBASEDEF, O2SAT  Vent Settings: N/A  Condition - Stable  Family Communication  :  Daughter [name] [phone] updated over the phone 8/23  Code Status :  Full Code  Diet :  Diet Order            Diet heart healthy/carb modified Room service appropriate? Yes; Fluid consistency: Thin; Fluid restriction: 1200 mL Fluid  Diet effective now                  Disposition Plan  :   Status is: Inpatient  Remains inpatient appropriate because:Inpatient level of care appropriate due to severity of illness   Dispo: The patient is from: Home              Anticipated d/c is to: Home              Anticipated d/c date is: 2 days              Patient currently is not medically stable to d/c.   Barriers to  discharge: Hypoxia requiring O2 supplementation/worsening hyponatremia  Antimicorbials  :    Anti-infectives (From admission, onward)   Start     Dose/Rate Route Frequency Ordered Stop   05/14/20  1000  remdesivir 100 mg in sodium chloride 0.9 % 100 mL IVPB       "Followed by" Linked Group Details   100 mg 200 mL/hr over 30 Minutes Intravenous Daily 05/13/20 0015 05/17/20 1000   05/13/20 0130  doxycycline (VIBRA-TABS) tablet 100 mg        100 mg Oral 2 times daily 05/13/20 0113     05/13/20 0100  remdesivir 200 mg in sodium chloride 0.9% 250 mL IVPB       "Followed by" Linked Group Details   200 mg 580 mL/hr over 30 Minutes Intravenous Once 05/13/20 0015 05/13/20 0507      Inpatient Medications  Scheduled Meds: . ascorbic acid  500 mg Oral Daily  . aspirin EC  81 mg Oral QHS  . baricitinib  4 mg Oral Daily  . carvedilol  6.25 mg Oral BID WC  . cholecalciferol  1,000 Units Oral Daily  . dicyclomine  20 mg Oral BID  . doxycycline  100 mg Oral BID  . enoxaparin (LOVENOX) injection  85 mg Subcutaneous Q24H  . feeding supplement (ENSURE ENLIVE)  237 mL Oral BID BM  . furosemide  40 mg Intravenous BID  . guaiFENesin  600 mg Oral BID  . insulin aspart  0-9 Units Subcutaneous TID PC & HS  . insulin detemir  10 Units Subcutaneous BID  . lamoTRIgine  200 mg Oral Daily  . levothyroxine  150 mcg Oral QHS  . linagliptin  5 mg Oral Daily  . montelukast  10 mg Oral QHS  . multivitamin with minerals  1 tablet Oral Daily  . pantoprazole  40 mg Oral Daily  . predniSONE  40 mg Oral Q breakfast  . simvastatin  40 mg Oral QPM  . zinc sulfate  220 mg Oral Daily   Continuous Infusions: . sodium chloride Stopped (05/21/20 1029)   PRN Meds:.acetaminophen, albuterol, guaiFENesin-dextromethorphan, lip balm, magnesium hydroxide, menthol-cetylpyridinium, polyethylene glycol, prochlorperazine, sodium chloride, traZODone, zolpidem   Time Spent in minutes  25  See all Orders from today for further  details   Jeoffrey Massed M.D on 05/21/2020 at 1:38 PM  To page go to [email or web address] - use universal password  [facility] -  Office  [phone]    Objective:   Vitals:   05/21/20 0438 05/21/20 0456 05/21/20 0600 05/21/20 0747  BP:    139/60  Pulse:    67  Resp:    16  Temp:    97.7 F (36.5 C)  TempSrc:      SpO2: 94%  98% 97%  Weight:  (!) 162.5 kg    Height:        Wt Readings from Last 3 Encounters:  05/21/20 (!) 162.5 kg  05/08/20 (!) 169.8 kg  04/15/20 (!) 170.1 kg     Intake/Output Summary (Last 24 hours) at 05/21/2020 1338 Last data filed at 05/21/2020 1029 Gross per 24 hour  Intake 1046.06 ml  Output 1950 ml  Net -903.94 ml     Physical Exam Gen Exam:Alert awake-not in any distress HEENT:atraumatic, normocephalic Chest: B/L clear to auscultation anteriorly CVS:S1S2 regular Abdomen:soft non tender, non distended Extremities:no edema Neurology: Non focal Skin: no rash   Data Review:    CBC Recent Labs  Lab 05/15/20 0108 05/16/20 0715 05/17/20 0213 05/18/20 0159  WBC 6.2 9.9 8.8 5.5  HGB 10.5* 11.0* 10.5* 11.0*  HCT 32.6* 34.2* 32.3* 32.4*  PLT 284 342 301 304  MCV 83.4 84.7 83.0 82.0  MCH 26.9  27.2 27.0 27.8  MCHC 32.2 32.2 32.5 34.0  RDW 15.8* 15.9* 15.8* 15.0  LYMPHSABS 0.7 1.6 1.1 0.5*  MONOABS 0.6 1.1* 0.7 0.2  EOSABS 0.0 0.0 0.0 0.0  BASOSABS 0.0 0.0 0.0 0.0    Chemistries  Recent Labs  Lab 05/17/20 0213 05/17/20 0213 05/18/20 0853 05/18/20 0853 05/19/20 0138 05/19/20 0138 05/19/20 0924 05/19/20 1753 05/20/20 0728 05/20/20 2155 05/21/20 0059  NA 126*   < > 129*   < > 123*   < > 123* 121* 125* 124* 126*  K 4.8   < > 5.0   < > 5.1   < > 5.2* 5.2* 5.2* 5.0 4.7  CL 88*   < > 85*   < > 84*   < > 84* 82* 82* 82* 82*  CO2 30   < > 34*   < > 30   < > 31 27 31 30  32  GLUCOSE 113*   < > 206*   < > 195*   < > 140* 248* 173* 218* 153*  BUN 15   < > 12   < > 21   < > 20 21 20  31* 31*  CREATININE 0.39*   < > 0.61   < >  0.67   < > 0.59 0.71 0.63 0.81 0.79  CALCIUM 9.0   < > 9.4   < > 9.0   < > 9.0 9.1 9.0 9.6 9.5  AST 36  --  35  --  48*  --   --   --  44*  --  41  ALT 43  --  44  --  56*  --   --   --  62*  --  66*  ALKPHOS 65  --  70  --  69  --   --   --  63  --  64  BILITOT 0.5  --  0.8  --  0.3  --   --   --  0.5  --  0.7   < > = values in this interval not displayed.   ------------------------------------------------------------------------------------------------------------------ No results for input(s): CHOL, HDL, LDLCALC, TRIG, CHOLHDL, LDLDIRECT in the last 72 hours.  Lab Results  Component Value Date   HGBA1C 7.3 (H) 05/05/2020   ------------------------------------------------------------------------------------------------------------------ No results for input(s): TSH, T4TOTAL, T3FREE, THYROIDAB in the last 72 hours.  Invalid input(s): FREET3 ------------------------------------------------------------------------------------------------------------------ Recent Labs    05/20/20 0142 05/21/20 0059  FERRITIN 221 206    Coagulation profile No results for input(s): INR, PROTIME in the last 168 hours.  Recent Labs    05/20/20 0142 05/21/20 0059  DDIMER 1.48* 1.25*    Cardiac Enzymes No results for input(s): CKMB, TROPONINI, MYOGLOBIN in the last 168 hours.  Invalid input(s): CK ------------------------------------------------------------------------------------------------------------------    Component Value Date/Time   BNP 139.5 (H) 05/16/2020 0715    Micro Results Recent Results (from the past 240 hour(s))  Blood Culture (routine x 2)     Status: None   Collection Time: 05/12/20 10:33 PM   Specimen: BLOOD  Result Value Ref Range Status   Specimen Description BLOOD RIGHT FOREARM  Final   Special Requests   Final    BOTTLES DRAWN AEROBIC AND ANAEROBIC Blood Culture adequate volume   Culture   Final    NO GROWTH 5 DAYS Performed at [facility], [address] [place], [address] [place]    Report Status 05/17/2020 FINAL  Final    Radiology Reports CT HEAD  WO CONTRAST  Result Date: 05/13/2020 CLINICAL DATA:  Fall with facial trauma. EXAM: CT HEAD WITHOUT CONTRAST TECHNIQUE: Contiguous axial images were obtained from the base of the skull through the vertex without intravenous contrast. COMPARISON:  05/26/2011 FINDINGS: Brain: Ventricles, cisterns and other CSF spaces are normal. There is no mass, mass effect, shift of midline structures or acute hemorrhage. No evidence of acute infarction. Vascular: No hyperdense vessel or unexpected calcification. Skull: Normal. Negative for fracture or focal lesion. Sinuses/Orbits: No acute finding. Other: None. IMPRESSION: No acute findings. Electronically Signed   By: Elberta Fortis M.D.   On: 05/13/2020 11:30   DG Chest Port 1 View  Result Date: 05/12/2020 CLINICAL DATA:  COVID positive shortness of breath EXAM: PORTABLE CHEST 1 VIEW COMPARISON:  May 03, 2020 FINDINGS: The heart size and mediastinal contours are unchanged with mild cardiomegaly. Aortic knob calcifications. Interval significant worsening in the multifocal patchy hazy airspace opacities throughout both lungs, right worse than left. No pleural effusion. No acute osseous abnormality. Cervical fixation hardware seen. IMPRESSION: Interval worsening in the multifocal airspace opacities consistent with pneumonia. Electronically Signed   By: Jonna Clark M.D.   On: 05/12/2020 22:26   DG Chest Port 1 View  Result Date: 05/03/2020 CLINICAL DATA:  Shortness of breath.  Hypoxia.  COVID-19 positive. EXAM: PORTABLE CHEST 1 VIEW COMPARISON:  Chest radiograph 10/31/2019 and CTA 11/09/2019 FINDINGS: The cardiac silhouette remains mildly enlarged. Aortic atherosclerosis is noted. There is persistent mild elevation of the right hemidiaphragm. Compared to the prior radiograph, there is increased pulmonary vascular congestion with mild interstitial densities bilaterally.  No sizable pleural effusion or pneumothorax is identified. Prior cervical fusion is noted. IMPRESSION: Cardiomegaly with pulmonary vascular congestion and mild bilateral interstitial opacities which may reflect edema or atypical/viral infection. Electronically Signed   By: Sebastian Ache M.D.   On: 05/03/2020 15:42   DG HIP UNILAT WITH PELVIS 2-3 VIEWS LEFT  Result Date: 05/13/2020 CLINICAL DATA:  Fall. EXAM: DG HIP (WITH OR WITHOUT PELVIS) 2-3V LEFT COMPARISON:  10/20/2016 FINDINGS: Left hip arthroplasty intact and unchanged. Moderate osteoarthritic change of the right hip. Stable irregularity over the inferior left acetabular region. Degenerate change of the spine with partially visualized fusion hardware intact over the lower lumbar spine. Mild degenerate change of the sacroiliac joints. IMPRESSION: 1. No acute findings. 2. Moderate osteoarthritic change of the right hip. Left hip arthroplasty intact. Electronically Signed   By: Elberta Fortis M.D.   On: 05/13/2020 11:28   SLEEP STUDY DOCUMENTS  Result Date: 04/25/2020 Ordered by an unspecified provider.

## 2020-05-21 NOTE — Progress Notes (Signed)
Physical Therapy Treatment Patient Details Name: Amy Barnes MRN: [id number] DOB: September 09, 1951 Today's Date: 05/21/2020    History of Present Illness Amy Barnes  is a 69 y.o. Caucasian female with a known history of coronary artery disease, type 2 diabetes mellitus, hypertension, dyslipidemia, hypothyroidism and obstructive sleep apnea as well as recent COVID-19 for which she was admitted here and discharged 5 days ago on room air, who presents to the emergency room with acute onset of worsening dyspnea with hypoxemia.  Her pulse currently was 83% on room air.      PT Comments    Patient received up in recliner, feeling better today but anxious about her [facility] being in [facility] due to an MI. Able to perform functional transfers well with RW, however mid-gait distance began to feel quite dizzy (HR/SPO2 on 3LPM WNL) and developed HA. We got back to her recliner and sat down, after which she reported no change in dizziness and that her lips felt numb and her vision had become blurry. Facial expressions were symmetrical, B UE/LE strength also weak but symmetrical, and no numbness or tingling in limbs. Paged RN who came to assess; NT took vitals which were WNL. SpO2 remained >92% on 3LPM O2 however. Patient was left in care of nursing staff with all needs met this morning. Will continue to follow acutely.     Follow Up Recommendations  Home health PT     Equipment Recommendations  None recommended by PT    Recommendations for Other Services       Precautions / Restrictions Precautions Precautions: Fall;Other (comment) Precaution Comments: COVID+, monitor O2 Restrictions Weight Bearing Restrictions: No    Mobility  Bed Mobility               General bed mobility comments: up in recliner  Transfers Overall transfer level: Needs assistance Equipment used: Rolling walker (2 wheeled) Transfers: Sit to/from Stand Sit to Stand: Supervision Stand pivot transfers: Supervision        General transfer comment: S for safety, no physical assist given, good awareness of safety and hand placement  Ambulation/Gait Ambulation/Gait assistance: Min guard Gait Distance (Feet): 20 Feet Assistive device: Rolling walker (2 wheeled) Gait Pattern/deviations: Step-through pattern;Trunk flexed;Wide base of support Gait velocity: decreased   General Gait Details: slow and steady with RW but mid-gait distance began to feel dizzy and started having a HA, unable to tolerate further gait distance safely today   Stairs             Wheelchair Mobility    Modified Rankin (Stroke Patients Only)       Balance Overall balance assessment: Needs assistance;History of Falls Sitting-balance support: No upper extremity supported;Feet supported Sitting balance-Leahy Scale: Good Sitting balance - Comments: but did not challenge dynamically   Standing balance support: Single extremity supported;During functional activity Standing balance-Leahy Scale: Fair Standing balance comment: reliant on one UE support during movement                            Cognition Arousal/Alertness: Awake/alert Behavior During Therapy: WFL for tasks assessed/performed;Anxious Overall Cognitive Status: Within Functional Limits for tasks assessed                                 General Comments: pleasant and cooperative, a bit more anxious today as her husband is in the 2H unit due to  an MI      Exercises      General Comments General comments (skin integrity, edema, etc.): sustained >92% on 3LPM during activity, HR WNL      Pertinent Vitals/Pain Pain Assessment: No/denies pain Pain Score: 0-No pain Pain Intervention(s): Limited activity within patient's tolerance;Monitored during session    Home Living                      Prior Function            PT Goals (current goals can now be found in the care plan section) Acute Rehab PT Goals Patient Stated  Goal: be able to take care of myself PT Goal Formulation: With patient Time For Goal Achievement: 05/28/20 Potential to Achieve Goals: Good Progress towards PT goals: Progressing toward goals    Frequency    Min 3X/week      PT Plan Current plan remains appropriate    Co-evaluation              AM-PAC PT "6 Clicks" Mobility   Outcome Measure  Help needed turning from your back to your side while in a flat bed without using bedrails?: A Little Help needed moving from lying on your back to sitting on the side of a flat bed without using bedrails?: A Little Help needed moving to and from a bed to a chair (including a wheelchair)?: A Little Help needed standing up from a chair using your arms (e.g., wheelchair or bedside chair)?: None Help needed to walk in hospital room?: A Little Help needed climbing 3-5 steps with a railing? : A Little 6 Click Score: 19    End of Session Equipment Utilized During Treatment: Oxygen Activity Tolerance: Patient tolerated treatment well Patient left: in chair;with call bell/phone within reach;with nursing/sitter in room Nurse Communication: Mobility status;Other (comment) (symptoms of concern) PT Visit Diagnosis: Unsteadiness on feet (R26.81);Difficulty in walking, not elsewhere classified (R26.2);Muscle weakness (generalized) (M62.81)     Time: 0350-0938 PT Time Calculation (min) (ACUTE ONLY): 21 min  Charges:  $Gait Training: 8-22 mins                     Windell Norfolk, DPT, PN1   Supplemental Physical Therapist [place]    Pager [phone] Acute Rehab Office [phone]

## 2020-05-21 NOTE — Progress Notes (Signed)
Occupational Therapy Treatment Patient Details Name: Amy Barnes MRN: [id number] DOB: 23-May-1951 Today's Date: 05/21/2020    History of present illness Amy Barnes  is a 69 y.o. Caucasian female with a known history of coronary artery disease, type 2 diabetes mellitus, hypertension, dyslipidemia, hypothyroidism and obstructive sleep apnea as well as recent COVID-19 for which she was admitted here and discharged 5 days ago on room air, who presents to the emergency room with acute onset of worsening dyspnea with hypoxemia.  Her pulse currently was 83% on room air.     OT comments  Pt progressing well with OT goals and demonstrates improving cardiopulmonary tolerance today. Pt received on 3 L O2 and sustained >93% during ADL transfers and toileting tasks. Pt did require Min A for toileting hygiene today but reports typically using AE for increased independence in this task. Plan to further progress activity tolerance and educate on energy conservation strategies during next session.    Follow Up Recommendations  Home health OT;Supervision - Intermittent    Equipment Recommendations  None recommended by OT (has all needed DME)    Recommendations for Other Services      Precautions / Restrictions Precautions Precautions: Fall;Other (comment) Precaution Comments: COVID+, monitor O2 Restrictions Weight Bearing Restrictions: No       Mobility Bed Mobility               General bed mobility comments: up in recliner  Transfers Overall transfer level: Needs assistance Equipment used: 1 person hand held assist Transfers: Sit to/from Stand;Stand Pivot Transfers Sit to Stand: Supervision Stand pivot transfers: Supervision       General transfer comment: S for safety, no physical assist given, good awareness of safety and hand placement    Balance Overall balance assessment: Needs assistance;History of Falls Sitting-balance support: No upper extremity supported;Feet  supported Sitting balance-Leahy Scale: Good     Standing balance support: Single extremity supported;During functional activity Standing balance-Leahy Scale: Fair Standing balance comment: reliant on one UE support during movement                           ADL either performed or assessed with clinical judgement   ADL Overall ADL's : Needs assistance/impaired Eating/Feeding: Set up;Sitting           Lower Body Bathing: Moderate assistance;Sit to/from stand Lower Body Bathing Details (indicate cue type and reason): Mod A for B feet and posterior region       Lower Body Dressing Details (indicate cue type and reason): Max A to don socks, reports wearing flip flops or slide on shoes at home Toilet Transfer: Administrator, sports Details (indicate cue type and reason): Supervision for SPT BSC <> recliner with HHA and use of IV pole Toileting- Clothing Manipulation and Hygiene: Minimal assistance;Sit to/from stand;Sitting/lateral lean Toileting - Clothing Manipulation Details (indicate cue type and reason): Min A for toileting task with assistance needed for posterior hygiene. Pt reports typically using AE for this task at home       General ADL Comments: Pt with improved O2 demands and looks to be feeling better today. Pt able to sustain WFL SPO2 stats on 3 L O2     Vision   Vision Assessment?: No apparent visual deficits   Perception     Praxis      Cognition Arousal/Alertness: Awake/alert Behavior During Therapy: WFL for tasks assessed/performed Overall Cognitive Status: Within Functional Limits for tasks assessed  General Comments: pleasant and cooperative        Exercises     Shoulder Instructions       General Comments Pt received on 3 L O2, sustained >93% throughout session and during activity    Pertinent Vitals/ Pain       Pain Assessment: No/denies pain  Home Living                                           Prior Functioning/Environment              Frequency  Min 2X/week        Progress Toward Goals  OT Goals(current goals can now be found in the care plan section)  Progress towards OT goals: Progressing toward goals  Acute Rehab OT Goals Patient Stated Goal: be able to take care of myself OT Goal Formulation: With patient Time For Goal Achievement: 05/28/20 Potential to Achieve Goals: Good ADL Goals Pt Will Perform Grooming: with supervision;standing Pt Will Perform Lower Body Bathing: with min guard assist;sit to/from stand Pt Will Transfer to Toilet: with supervision;ambulating;bedside commode Pt Will Perform Toileting - Clothing Manipulation and hygiene: sit to/from stand;with min guard assist Additional ADL Goal #1: Pt to verbalize at least 3 energy conservation strategies in order to maximize ADL independence  Plan Discharge plan remains appropriate    Co-evaluation                 AM-PAC OT "6 Clicks" Daily Activity     Outcome Measure   Help from another person eating meals?: None Help from another person taking care of personal grooming?: A Little Help from another person toileting, which includes using toliet, bedpan, or urinal?: A Little Help from another person bathing (including washing, rinsing, drying)?: A Lot Help from another person to put on and taking off regular upper body clothing?: A Little Help from another person to put on and taking off regular lower body clothing?: A Lot 6 Click Score: 17    End of Session Equipment Utilized During Treatment: Oxygen  OT Visit Diagnosis: Unsteadiness on feet (R26.81);Other abnormalities of gait and mobility (R26.89);Muscle weakness (generalized) (M62.81);History of falling (Z91.81)   Activity Tolerance Patient tolerated treatment well   Patient Left in chair;with call bell/phone within reach   Nurse Communication Mobility status;Other  (comment) (O2, purewick assistance)        Time: 7824-2353 OT Time Calculation (min): 26 min  Charges: OT General Charges $OT Visit: 1 Visit OT Treatments $Self Care/Home Management : 23-37 mins  Lorre Munroe, OTR/L   Lorre Munroe 05/21/2020, 9:29 AM

## 2020-05-21 NOTE — Progress Notes (Signed)
Patient received a call from family letting her know her husband was in the ED at [facility] and had a possible heart attack.  Understandably patient was upset and emotional.  I am awaiting to obtain information from [facility] RN on his status.

## 2020-05-21 NOTE — Plan of Care (Signed)
Discussed with patient plan of care for the evening, pain management and getting up to chair today with some teach back displayed

## 2020-05-21 NOTE — Progress Notes (Signed)
Writer attempted to contact unit for behavioral health ipad. Her nurse Theodoro Grist was in a isolation room, and requested assistance with someone else retrieving the Ipad from [facility] office. This writer called [phone] and was unsuccessful with reaching someone. Will attempt to assess patient later. Patient is COVID positive and will have to be seen via telepsychiatry. 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 Need new medication recommendations. May benefit from Lamictal, however need to inquire about which symptoms they were targeting with Trileptal.

## 2020-05-22 DIAGNOSIS — J432 Centrilobular emphysema: Secondary | ICD-10-CM

## 2020-05-22 LAB — BASIC METABOLIC PANEL
Anion gap: 8 (ref 5–15)
BUN: 26 mg/dL — ABNORMAL HIGH (ref 8–23)
CO2: 38 mmol/L — ABNORMAL HIGH (ref 22–32)
Calcium: 9.1 mg/dL (ref 8.9–10.3)
Chloride: 87 mmol/L — ABNORMAL LOW (ref 98–111)
Creatinine, Ser: 0.62 mg/dL (ref 0.44–1.00)
GFR calc Af Amer: >60 mL/min (ref >60)
GFR calc non Af Amer: >60 mL/min (ref >60)
Glucose, Bld: 91 mg/dL (ref 70–99)
Potassium: 4.1 mmol/L (ref 3.5–5.1)
Sodium: 133 mmol/L — ABNORMAL LOW (ref 135–145)

## 2020-05-22 LAB — GLUCOSE, CAPILLARY
Glucose-Capillary: 92 mg/dL (ref 70–99)
Glucose-Capillary: 139 mg/dL — ABNORMAL HIGH (ref 70–99)
Glucose-Capillary: 232 mg/dL — ABNORMAL HIGH (ref 70–99)

## 2020-05-22 LAB — C-REACTIVE PROTEIN: CRP: 1.3 mg/dL — ABNORMAL HIGH (ref <1.0)

## 2020-05-22 LAB — D-DIMER, QUANTITATIVE: D-Dimer, Quant: 1.29 ug/mL-FEU — ABNORMAL HIGH (ref 0.00–0.50)

## 2020-05-22 LAB — FERRITIN: Ferritin: 181 ng/mL (ref 11–307)

## 2020-05-22 MED ORDER — ENSURE ENLIVE PO LIQD: 237.0000 mL | Freq: Two times a day (BID) | ORAL | 0 refills | Status: AC

## 2020-05-22 MED ORDER — FUROSEMIDE 40 MG PO TABS
40.0000 mg | ORAL_TABLET | Freq: Every day | ORAL | Status: DC
  Administered 2020-05-22: 40 mg via ORAL
  Filled 2020-05-22: qty 1

## 2020-05-22 MED ORDER — PREDNISONE 10 MG PO TABS
ORAL_TABLET | ORAL | 0 refills | Status: DC
Start: 2020-05-22 — End: 2020-05-30

## 2020-05-22 MED ORDER — FUROSEMIDE 40 MG PO TABS
40.0000 mg | ORAL_TABLET | Freq: Every day | ORAL | 0 refills | Status: DC
Start: 2020-05-22 — End: 2020-12-12

## 2020-05-22 NOTE — Progress Notes (Signed)
SATURATION QUALIFICATIONS: (This note is used to comply with regulatory documentation for home oxygen)  Patient Saturations on Room Air at Rest = 88%  Patient Saturations on Room Air while Ambulating = unable to assess d/t SOB   Patient Saturations on 3 Liters of oxygen while Ambulating = 93%  Please briefly explain why patient needs home oxygen: PT unable to maintain oxygen saturations above 90% at rest or while ambulating

## 2020-05-22 NOTE — Plan of Care (Signed)

## 2020-05-22 NOTE — Discharge Instructions (Signed)
Person Under Monitoring Name: Amy Barnes  Location: [address]  [place] [address]   Infection Prevention Recommendations for Individuals Confirmed to have, or Being Evaluated for, 2019 Novel Coronavirus (COVID-19) Infection Who Receive Care at Home  Individuals who are confirmed to have, or are being evaluated for, COVID-19 should follow the prevention steps below until a healthcare provider or local or state health department says they can return to normal activities.  Stay home except to get medical care You should restrict activities outside your home, except for getting medical care. Do not go to work, school, or public areas, and do not use public transportation or taxis.  Call ahead before visiting your doctor Before your medical appointment, call the healthcare provider and tell them that you have, or are being evaluated for, COVID-19 infection. This will help the healthcare provider's office take steps to keep other people from getting infected. Ask your healthcare provider to call the local or state health department.  Monitor your symptoms Seek prompt medical attention if your illness is worsening (e.g., difficulty breathing). Before going to your medical appointment, call the healthcare provider and tell them that you have, or are being evaluated for, COVID-19 infection. Ask your healthcare provider to call the local or state health department.  Wear a facemask You should wear a facemask that covers your nose and mouth when you are in the same room with other people and when you visit a healthcare provider. People who live with or visit you should also wear a facemask while they are in the same room with you.  Separate yourself from other people in your home As much as possible, you should stay in a different room from other people in your home. Also, you should use a separate bathroom, if available.  Avoid sharing household items You should not  share dishes, drinking glasses, cups, eating utensils, towels, bedding, or other items with other people in your home. After using these items, you should wash them thoroughly with soap and water.  Cover your coughs and sneezes Cover your mouth and nose with a tissue when you cough or sneeze, or you can cough or sneeze into your sleeve. Throw used tissues in a lined trash can, and immediately wash your hands with soap and water for at least 20 seconds or use an alcohol-based hand rub.  Wash your [employer] your hands often and thoroughly with soap and water for at least 20 seconds. You can use an alcohol-based hand sanitizer if soap and water are not available and if your hands are not visibly dirty. Avoid touching your eyes, nose, and mouth with unwashed hands.   Prevention Steps for Caregivers and Household Members of Individuals Confirmed to have, or Being Evaluated for, COVID-19 Infection Being Cared for in the Home  If you live with, or provide care at home for, a person confirmed to have, or being evaluated for, COVID-19 infection please follow these guidelines to prevent infection:  Follow healthcare provider's instructions Make sure that you understand and can help the patient follow any healthcare provider instructions for all care.  Provide for the patient's basic needs You should help the patient with basic needs in the home and provide support for getting groceries, prescriptions, and other personal needs.  Monitor the patient's symptoms If they are getting sicker, call his or her medical provider and tell them that the patient has, or is being evaluated for, COVID-19 infection. This will help the healthcare provider's  office take steps to keep other people from getting infected. Ask the healthcare provider to call the local or state health department.  Limit the number of people who have contact with the patient  If possible, have only one caregiver for the  patient.  Other household members should stay in another home or place of residence. If this is not possible, they should stay  in another room, or be separated from the patient as much as possible. Use a separate bathroom, if available.  Restrict visitors who do not have an essential need to be in the home.  Keep older adults, very young children, and other sick people away from the patient Keep older adults, very young children, and those who have compromised immune systems or chronic health conditions away from the patient. This includes people with chronic heart, lung, or kidney conditions, diabetes, and cancer.  Ensure good ventilation Make sure that shared spaces in the home have good air flow, such as from an air conditioner or an opened window, weather permitting.  Wash your hands often  Wash your hands often and thoroughly with soap and water for at least 20 seconds. You can use an alcohol based hand sanitizer if soap and water are not available and if your hands are not visibly dirty.  Avoid touching your eyes, nose, and mouth with unwashed hands.  Use disposable paper towels to dry your hands. If not available, use dedicated cloth towels and replace them when they become wet.  Wear a facemask and gloves  Wear a disposable facemask at all times in the room and gloves when you touch or have contact with the patient's blood, body fluids, and/or secretions or excretions, such as sweat, saliva, sputum, nasal mucus, vomit, urine, or feces.  Ensure the mask fits over your nose and mouth tightly, and do not touch it during use.  Throw out disposable facemasks and gloves after using them. Do not reuse.  Wash your hands immediately after removing your facemask and gloves.  If your personal clothing becomes contaminated, carefully remove clothing and launder. Wash your hands after handling contaminated clothing.  Place all used disposable facemasks, gloves, and other waste in a lined  container before disposing them with other household waste.  Remove gloves and wash your hands immediately after handling these items.  Do not share dishes, glasses, or other household items with the patient  Avoid sharing household items. You should not share dishes, drinking glasses, cups, eating utensils, towels, bedding, or other items with a patient who is confirmed to have, or being evaluated for, COVID-19 infection.  After the person uses these items, you should wash them thoroughly with soap and water.  Wash laundry thoroughly  Immediately remove and wash clothes or bedding that have blood, body fluids, and/or secretions or excretions, such as sweat, saliva, sputum, nasal mucus, vomit, urine, or feces, on them.  Wear gloves when handling laundry from the patient.  Read and follow directions on labels of laundry or clothing items and detergent. In general, wash and dry with the warmest temperatures recommended on the label.  Clean all areas the individual has used often  Clean all touchable surfaces, such as counters, tabletops, doorknobs, bathroom fixtures, toilets, phones, keyboards, tablets, and bedside tables, every day. Also, clean any surfaces that may have blood, body fluids, and/or secretions or excretions on them.  Wear gloves when cleaning surfaces the patient has come in contact with.  Use a diluted bleach solution (e.g., dilute bleach with 1  part bleach and 10 parts water) or a household disinfectant with a label that says EPA-registered for coronaviruses. To make a bleach solution at home, add 1 tablespoon of bleach to 1 quart (4 cups) of water. For a larger supply, add  cup of bleach to 1 gallon (16 cups) of water.  Read labels of cleaning products and follow recommendations provided on product labels. Labels contain instructions for safe and effective use of the cleaning product including precautions you should take when applying the product, such as wearing gloves or  eye protection and making sure you have good ventilation during use of the product.  Remove gloves and wash hands immediately after cleaning.  Monitor yourself for signs and symptoms of illness Caregivers and household members are considered close contacts, should monitor their health, and will be asked to limit movement outside of the home to the extent possible. Follow the monitoring steps for close contacts listed on the symptom monitoring form.   ? If you have additional questions, contact your local health department or call the epidemiologist on call at [phone] (available 24/7). ? This guidance is subject to change. For the most up-to-date guidance from [facility], please refer to their website: [email or web address].pl

## 2020-05-22 NOTE — Progress Notes (Signed)
Inpatient Diabetes Program Recommendations  AACE/ADA: New Consensus Statement on Inpatient Glycemic Control (2015)  Target Ranges:  Prepandial:   less than 140 mg/dL      Peak postprandial:   less than 180 mg/dL (1-2 hours)      Critically ill patients:  140 - 180 mg/dL   Lab Results  Component Value Date   GLUCAP 92 05/22/2020   HGBA1C 7.3 (H) 05/05/2020    Review of Glycemic Control Results for Poster, Amy Barnes "Amy Barnes" (MRN [id number]) as of 05/22/2020 11:21  Ref. Range 05/21/2020 07:23 05/21/2020 12:15 05/21/2020 16:28 05/21/2020 21:41 05/22/2020 07:52  Glucose-Capillary Latest Ref Range: 70 - 99 mg/dL 83 916 (H) 945 (H) 038 (H) 92   Diabetes history: DM 2 Outpatient Diabetes medications: Metformin 500 mg q PM Current orders for Inpatient glycemic control:  Novolog sensitive tid with meals and HS Levemir 10 units bid Tradjenta 5 mg daily Prednisone 30 mg with breakfast  Inpatient Diabetes Program Recommendations:    Consider adding Novolog 2 units tid with meals while patient is on steroids.  Once steroids stopped, insulin will need to be reduced.   Thanks  Beryl Meager, RN, BC-ADM Inpatient Diabetes Coordinator Pager [phone] (8a-5p)

## 2020-05-22 NOTE — TOC Transition Note (Signed)
Transition of Care [facility]) - CM/SW Discharge Note   Patient Details  Name: Amy Barnes MRN: [id number] Date of Birth: 12-29-1950  [facility]) CM/SW Contact:  Beckie Busing, RN Phone Number: 05/22/2020, 11:30 AM   Clinical Narrative:  Patient is discharging home with home health an O2 therapy.  Frances Furbish and [employer] made aware of d/c set for today. No further needs noted at this time. CM will sign off.     Final next level of care: Home w Home Health Services Barriers to Discharge: No Barriers Identified   Patient Goals and CMS Choice   CMS Medicare.gov Compare Post Acute Care list provided to:: Patient Represenative (must comment) (Daughter [name]) Choice offered to / list presented to : Adult Children  Discharge Placement                       Discharge Plan and Services In-house Referral: NA Discharge Planning Services: CM Consult Post Acute Care Choice: Home Health          DME Arranged: Oxygen DME Agency: Other - Comment [facility]) Date DME Agency Contacted: 05/17/20 Time DME Agency Contacted: 1451 Representative spoke with at DME Agency: Vaughan Basta HH Arranged: PT, OT HH Agency: [facility] Date [facility] Agency Contacted: 05/17/20 Time HH Agency Contacted: 1451 Representative spoke with at [facility] Agency: Kandee Keen  Social Determinants of Health (SDOH) Interventions     Readmission Risk Interventions Readmission Risk Prevention Plan 11/14/2019  Transportation Screening Complete  PCP or Specialist Appt within 3-5 Days Complete  HRI or Home Care Consult Complete  Social Work Consult for Recovery Care Planning/Counseling Complete  Palliative Care Screening Not Applicable  Medication Review Oceanographer) Complete  Some recent data might be hidden

## 2020-05-22 NOTE — TOC Progression Note (Signed)
Transition of Care [facility]) - Progression Note    Patient Details  Name: Amy Barnes MRN: [id number] Date of Birth: [birth year]  Transition of Care [facility]) CM/SW Contact  Beckie Busing, RN Phone Number: [phone]  05/22/2020, 3:09 PM  Clinical Narrative:    Nurse reports that patient states that her portable O2 tank was not delivered to the room. [employer] [facility] has been notified. Rep will call office and touch bases with CM to update on the plan to deliver another tank. [employer] did make the CM aware on Friday 05/18/20 that a portable tank was deliverd to the room. CM is not sure what happened to the tank. Awaiting to hear back from [employer].    Expected Discharge Plan: Home w Home Health Services Barriers to Discharge: No Barriers Identified  Expected Discharge Plan and Services Expected Discharge Plan: Home w Home Health Services In-house Referral: NA Discharge Planning Services: CM Consult Post Acute Care Choice: Home Health Living arrangements for the past 2 months: Single Family Home Expected Discharge Date: 05/22/20               DME Arranged: Oxygen DME Agency: Other - Comment [facility]) Date DME Agency Contacted: 05/17/20 Time DME Agency Contacted: 1451 Representative spoke with at DME Agency: Vaughan Basta HH Arranged: PT, OT HH Agency: [facility] Date [facility] Agency Contacted: 05/17/20 Time HH Agency Contacted: 1451 Representative spoke with at [facility] Agency: Kandee Keen   Social Determinants of Health (SDOH) Interventions    Readmission Risk Interventions Readmission Risk Prevention Plan 11/14/2019  Transportation Screening Complete  PCP or Specialist Appt within 3-5 Days Complete  HRI or Home Care Consult Complete  Social Work Consult for Recovery Care Planning/Counseling Complete  Palliative Care Screening Not Applicable  Medication Review Oceanographer) Complete  Some recent data might be hidden

## 2020-05-22 NOTE — Discharge Summary (Addendum)
PATIENT DETAILS Name: Amy Barnes Age: 69 y.o. Sex: female Date of Birth: 1951-01-25 MRN: [id number]. Admitting Physician: Hannah Beat, MD [id number], Wynona Canes, MD  Admit Date: 05/12/2020 Discharge date: 05/22/2020  Recommendations for Outpatient Follow-up:  1. Follow up with PCP in 1-2 weeks 2. Please obtain CMP/CBC in one week 3. Repeat Chest Xray in 4-6 week 4. 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 PCP to reassess whether patient still requires home O2 at next visit 6. Hydralazine/losartan on hold-resume when able    Admitted From:  Home  Disposition: Home with home health services   Home Health: Yes  Equipment/Devices: oxygen 3L  Discharge Condition: Stable  CODE STATUS: FULL CODE  Diet recommendation:  Diet Order            Diet - low sodium heart healthy           Diet Carb Modified           Diet heart healthy/carb modified Room service appropriate? Yes; Fluid consistency: Thin; Fluid restriction: 1200 mL Fluid  Diet effective now                  Brief Narrative: Patient is a 69 y.o. 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  Hypoxia slowly improved with steroids and other supportive measures-however hospital course then complicated by hyponatremia.  See below for further details.  COVID-19 vaccinated status: Unvaccinated  Significant Events: 8/5-8/10>> hospitalized for hypoxia secondary to COVID-19-discharged on room air. 8/14>> presented back to the ED with shortness of  breath-readmitted for hypoxia secondary to COVID-19 pneumonitis  Significant studies: 8/14>>Chest x-ray: Interval worsening in the multifocal airspace opacities consistent with pneumonia 8/15>> x-ray hip: No acute findings-moderate OA changes of the right hip, left hip arthroplasty intact. 8/15>> CT head: No acute findings  COVID-19 medications: Steroids: 8/14>> Remdesivir: 8/14>> 8/19  Antibiotics: Doxycycline-resumed in the hospital-on chronically for chronic MRSA infection in back hardware.  Microbiology data: 8/14 blood culture: No growth   Procedures: None  Consults: None  Brief Hospital Course: Acute Hypoxic Resp Failure due to Covid 19 Viral pneumonia: Much better-Down to 2-3 L of oxygen.  Continued attempts to slowly titrate down FiO2 in the outpatient setting.    Has finished a course of remdesivir-we will continue to taper down steroids over the next few days.  Home O2 arranged-PCP to assess if patient still requires home O2 at next visit.    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  Trileptal was held-patient was placed on fluid restriction-and started on Lasix.  Sodium is significantly improved.  Stable for discharge today.  Emphasized the importance of fluid restriction-continue daily dosing of Lasix on discharge.  PCP to repeat chemistries in 1 week upon follow-up.  Hyperkalemia: Resolved.  History of nonischemic cardiomyopathy: Volume status relatively stable-lower extremity edema has resolved.  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  Echo on 5/24 showed  normalization of EF.  HTN: BP controlled-continue Coreg-continue to hold losartan/hydralazine.  HLD: Continue statin  DM-2:  Did have steroid-induced hyperglycemia during this hospital stay-managed with Levemir and SSI-on discharge-will be resumed on Metformin and her usual oral hypoglycemic regimen.  Suspect as steroids gets tapered down-she will have significantly less hyperglycemia.  Hypothyroidism: Continue Synthroid  GERD: Continue PPI  Depression/bipolar disorder: Remains stable-Trileptal held for [facility] consulted psychiatry-for medication management-as daughter claims that she was finally stabilized on Trileptal-and has been on it for the past several years.  Remains on Lamictal.  Per psychiatry-okay to continue to hold Trileptal-and have patient follow-up with primary psychiatrist.  Per patient's daughter-patient psychiatrist to a televisit post discharge-and optimize medications accordingly.  Chronic MRSA infection and back hardware: On suppressive doxycycline.  OSA: CPAP nightly  Fall/debility/deconditioning: Secondary to acute illness with COVID-19-CT head without any abnormalities, left hip x-ray without any acute bony findings.  Suspect has some significant amount of debility at baseline-normally walks with a cane/walker at home.  PT OT following with recommendations for home health services on discharge.  Nutrition Problem: Nutrition Problem: Increased nutrient needs Etiology: acute illness (COVID 19) Signs/Symptoms: estimated needs Interventions: Ensure Enlive (each supplement provides 350kcal and 20 grams of protein), MVI  Morbid Obesity: Estimated body mass index is 61.49 kg/m as calculated from the following:   Height as of this encounter:  (1.626 m).   Weight as of this encounter: 162.5 kg.    Discharge Diagnoses:  Active Problems:   Hypothyroidism   DM type 2 with diabetic peripheral neuropathy (HCC)   CAD (coronary artery disease)    Centrilobular emphysema (HCC)   Hyponatremia   HLD (hyperlipidemia)   COVID-19   Discharge Instructions:    Person Under Monitoring Name: KASSIAH MCCRORY  Location: [address]  [place] [address]   Infection Prevention Recommendations for Individuals Confirmed to have, or Being Evaluated for, 2019 Novel Coronavirus (COVID-19) Infection Who Receive Care at Home  Individuals who are confirmed to have, or are being evaluated for, COVID-19 should follow the prevention steps below until a healthcare provider or local or state health department says they can return to normal activities.  Stay home except to get medical care You should restrict activities outside your home, except for getting medical care. Do not go to work, school, or public areas, and do not use public transportation or taxis.  Call ahead before visiting your doctor Before your medical appointment, call the healthcare provider and tell them that you have, or are being evaluated for, COVID-19 infection. This will help the healthcare provider's office take steps to keep other people from getting infected. Ask your healthcare provider to call the local or state health department.  Monitor your symptoms Seek prompt medical attention if your illness is worsening (e.g., difficulty breathing). Before going to your medical appointment, call the healthcare provider and tell them that you have, or are being evaluated for, COVID-19 infection. Ask your healthcare provider to call the local or state health department.  Wear a facemask You should wear a facemask that covers your nose and mouth when you are in the same room with other people and when you visit a healthcare provider. People who live with or visit you should also wear a facemask while they are in the same room with you.  Separate  yourself from other people in your home As much as possible, you should stay in a different room from other people in your  home. Also, you should use a separate bathroom, if available.  Avoid sharing household items You should not share dishes, drinking glasses, cups, eating utensils, towels, bedding, or other items with other people in your home. After using these items, you should wash them thoroughly with soap and water.  Cover your coughs and sneezes Cover your mouth and nose with a tissue when you cough or sneeze, or you can cough or sneeze into your sleeve. Throw used tissues in a lined trash can, and immediately wash your hands with soap and water for at least 20 seconds or use an alcohol-based hand rub.  Wash your [employer] your hands often and thoroughly with soap and water for at least 20 seconds. You can use an alcohol-based hand sanitizer if soap and water are not available and if your hands are not visibly dirty. Avoid touching your eyes, nose, and mouth with unwashed hands.   Prevention Steps for Caregivers and Household Members of Individuals Confirmed to have, or Being Evaluated for, COVID-19 Infection Being Cared for in the Home  If you live with, or provide care at home for, a person confirmed to have, or being evaluated for, COVID-19 infection please follow these guidelines to prevent infection:  Follow healthcare provider's instructions Make sure that you understand and can help the patient follow any healthcare provider instructions for all care.  Provide for the patient's basic needs You should help the patient with basic needs in the home and provide support for getting groceries, prescriptions, and other personal needs.  Monitor the patient's symptoms If they are getting sicker, call his or her medical provider and tell them that the patient has, or is being evaluated for, COVID-19 infection. This will help the healthcare provider's office take steps to keep other people from getting infected. Ask the healthcare provider to call the local or state health department.  Limit the  number of people who have contact with the patient  If possible, have only one caregiver for the patient.  Other household members should stay in another home or place of residence. If this is not possible, they should stay  in another room, or be separated from the patient as much as possible. Use a separate bathroom, if available.  Restrict visitors who do not have an essential need to be in the home.  Keep older adults, very young children, and other sick people away from the patient Keep older adults, very young children, and those who have compromised immune systems or chronic health conditions away from the patient. This includes people with chronic heart, lung, or kidney conditions, diabetes, and cancer.  Ensure good ventilation Make sure that shared spaces in the home have good air flow, such as from an air conditioner or an opened window, weather permitting.  Wash your hands often  Wash your hands often and thoroughly with soap and water for at least 20 seconds. You can use an alcohol based hand sanitizer if soap and water are not available and if your hands are not visibly dirty.  Avoid touching your eyes, nose, and mouth with unwashed hands.  Use disposable paper towels to dry your hands. If not available, use dedicated cloth towels and replace them when they become wet.  Wear a facemask and gloves  Wear a disposable facemask at all times in the room and gloves when you  touch or have contact with the patient's blood, body fluids, and/or secretions or excretions, such as sweat, saliva, sputum, nasal mucus, vomit, urine, or feces.  Ensure the mask fits over your nose and mouth tightly, and do not touch it during use.  Throw out disposable facemasks and gloves after using them. Do not reuse.  Wash your hands immediately after removing your facemask and gloves.  If your personal clothing becomes contaminated, carefully remove clothing and launder. Wash your hands after  handling contaminated clothing.  Place all used disposable facemasks, gloves, and other waste in a lined container before disposing them with other household waste.  Remove gloves and wash your hands immediately after handling these items.  Do not share dishes, glasses, or other household items with the patient  Avoid sharing household items. You should not share dishes, drinking glasses, cups, eating utensils, towels, bedding, or other items with a patient who is confirmed to have, or being evaluated for, COVID-19 infection.  After the person uses these items, you should wash them thoroughly with soap and water.  Wash laundry thoroughly  Immediately remove and wash clothes or bedding that have blood, body fluids, and/or secretions or excretions, such as sweat, saliva, sputum, nasal mucus, vomit, urine, or feces, on them.  Wear gloves when handling laundry from the patient.  Read and follow directions on labels of laundry or clothing items and detergent. In general, wash and dry with the warmest temperatures recommended on the label.  Clean all areas the individual has used often  Clean all touchable surfaces, such as counters, tabletops, doorknobs, bathroom fixtures, toilets, phones, keyboards, tablets, and bedside tables, every day. Also, clean any surfaces that may have blood, body fluids, and/or secretions or excretions on them.  Wear gloves when cleaning surfaces the patient has come in contact with.  Use a diluted bleach solution (e.g., dilute bleach with 1 part bleach and 10 parts water) or a household disinfectant with a label that says EPA-registered for coronaviruses. To make a bleach solution at home, add 1 tablespoon of bleach to 1 quart (4 cups) of water. For a larger supply, add  cup of bleach to 1 gallon (16 cups) of water.  Read labels of cleaning products and follow recommendations provided on product labels. Labels contain instructions for safe and effective use of the  cleaning product including precautions you should take when applying the product, such as wearing gloves or eye protection and making sure you have good ventilation during use of the product.  Remove gloves and wash hands immediately after cleaning.  Monitor yourself for signs and symptoms of illness Caregivers and household members are considered close contacts, should monitor their health, and will be asked to limit movement outside of the home to the extent possible. Follow the monitoring steps for close contacts listed on the symptom monitoring form.   ? If you have additional questions, contact your local health department or call the epidemiologist on call at [phone] (available 24/7). ? This guidance is subject to change. For the most up-to-date guidance from CDC, please refer to their website: [employer]Met[employer].hu    Activity:  As tolerated with Full fall precautions use walker/cane & assistance as needed  Discharge Instructions    Call MD for:  difficulty breathing, headache or visual disturbances   Complete by: As directed    Diet - low sodium heart healthy   Complete by: As directed    1200-1500 cc of fluid restriction daily.   Diet Carb Modified  Complete by: As directed    Discharge instructions   Complete by: As directed    21 days of isolation from 05/03/2020  Please ask your primary care practitioner to reassess whether you require oxygen at next visit  I have refilled your psych medication-Trileptal due to low sodium levels-please consult with your primary psychiatrist to see if this can be resumed.  You have been started on scheduled dosing of furosemide (water pill)-your other blood pressure medications-hydralazine and losartan on hold.  Your primary care practitioner will follow up and decide whether these can be resumed.  Please continue to be on fluid restriction-1200-1500 cc a day.  Follow with  Primary MD  [name], MD in 1-2 weeks  Please get a complete blood count and chemistry panel checked by your Primary MD at your next visit, and again as instructed by your Primary MD.  Get Medicines reviewed and adjusted: Please take all your medications with you for your next visit with your Primary MD  Laboratory/radiological data: Please request your Primary MD to go over all hospital tests and procedure/radiological results at the follow up, please ask your Primary MD to get all Hospital records sent to his/her office.  In some cases, they will be blood work, cultures and biopsy results pending at the time of your discharge. Please request that your primary care M.D. follows up on these results.  Also Note the following: If you experience worsening of your admission symptoms, develop shortness of breath, life threatening emergency, suicidal or homicidal thoughts you must seek medical attention immediately by calling 911 or calling your MD immediately  if symptoms less severe.  You must read complete instructions/literature along with all the possible adverse reactions/side effects for all the Medicines you take and that have been prescribed to you. Take any new Medicines after you have completely understood and accpet all the possible adverse reactions/side effects.   Do not drive when taking Pain medications or sleeping medications (Benzodaizepines)  Do not take more than prescribed Pain, Sleep and Anxiety Medications. It is not advisable to combine anxiety,sleep and pain medications without talking with your primary care practitioner  Special Instructions: If you have smoked or chewed Tobacco  in the last 2 yrs please stop smoking, stop any regular Alcohol  and or any Recreational drug use.  Wear Seat belts while driving.  Please note: You were cared for by a hospitalist during your hospital stay. Once you are discharged, your primary care physician will handle any further  medical issues. Please note that NO REFILLS for any discharge medications will be authorized once you are discharged, as it is imperative that you return to your primary care physician (or establish a relationship with a primary care physician if you do not have one) for your post hospital discharge needs so that they can reassess your need for medications and monitor your lab values.   Increase activity slowly   Complete by: As directed      Allergies as of 05/22/2020      Reactions   Toviaz [fesoterodine] Nausea And Vomiting   Severe reaction - s/p several ER visits then hospitalization ?stress induced cardiomyopathy   Cephalexin Hives   Hydrocodone Other (See Comments)   Reaction:  Hallucinations    Entresto [sacubitril-valsartan]    Tolterodine Tartrate Nausea And Vomiting   Risperidone And Related Other (See Comments)   Reaction:  Made pt excessively sleepy   Seroquel [quetiapine Fumarate] Other (See Comments)   Reaction:  Made pt  excessively sleepy   Sulfa Antibiotics Rash      Medication List    STOP taking these medications   cefixime 400 MG Caps capsule Commonly known as: SUPRAX   celecoxib 200 MG capsule Commonly known as: CeleBREX   fluconazole 200 MG tablet Commonly known as: DIFLUCAN   hydrALAZINE 10 MG tablet Commonly known as: APRESOLINE   losartan 50 MG tablet Commonly known as: COZAAR   Oxcarbazepine 300 MG tablet Commonly known as: TRILEPTAL   oxybutynin 10 MG 24 hr tablet Commonly known as: DITROPAN-XL     TAKE these medications   albuterol 108 (90 Base) MCG/ACT inhaler Commonly known as: VENTOLIN HFA Inhale 2 puffs into the lungs every 6 (six) hours as needed for wheezing or shortness of breath.   ascorbic acid 500 MG tablet Commonly known as: VITAMIN C Take 1 tablet (500 mg total) by mouth daily.   aspirin 81 MG EC tablet Take 81 mg by mouth at bedtime.   budesonide-formoterol 160-4.5 MCG/ACT inhaler Commonly known as: SYMBICORT Inhale 2  puffs into the lungs 2 (two) times daily.   carvedilol 6.25 MG tablet Commonly known as: COREG Take 1 tablet (6.25 mg total) by mouth 2 (two) times daily.   dicyclomine 20 MG tablet Commonly known as: BENTYL Take 1 tablet (20 mg total) by mouth in the morning and at bedtime.   doxycycline 100 MG tablet Commonly known as: VIBRA-TABS Take 1 tablet (100 mg total) by mouth 2 (two) times daily.   feeding supplement (ENSURE ENLIVE) Liqd Take 237 mLs by mouth 2 (two) times daily between meals.   FLUoxetine 40 MG capsule Commonly known as: PROZAC Take 40 mg by mouth daily.   furosemide 40 MG tablet Commonly known as: LASIX Take 1 tablet (40 mg total) by mouth daily. What changed:   medication strength  how much to take  when to take this  reasons to take this   lamoTRIgine 200 MG tablet Commonly known as: LAMICTAL Take 200 mg by mouth daily.   levothyroxine 150 MCG tablet Commonly known as: SYNTHROID TAKE ONE TABLET BY MOUTH AT BEDTIME What changed: when to take this   metFORMIN 500 MG tablet Commonly known as: GLUCOPHAGE Take 1 tablet (500 mg total) by mouth every evening.   montelukast 10 MG tablet Commonly known as: SINGULAIR Take 1 tablet (10 mg total) by mouth at bedtime.   multivitamin with minerals Tabs tablet Take 2 tablets by mouth daily.   ondansetron 8 MG disintegrating tablet Commonly known as: Zofran ODT Take 1 tablet (8 mg total) by mouth 2 (two) times daily as needed for nausea or vomiting.   pantoprazole 40 MG tablet Commonly known as: PROTONIX Take 1 tablet (40 mg total) by mouth 2 (two) times daily. What changed: when to take this   polyethylene glycol 17 g packet Commonly known as: MIRALAX / GLYCOLAX Take 17 g by mouth 2 (two) times daily as needed for mild constipation.   predniSONE 10 MG tablet Commonly known as: DELTASONE Take 2 tablets daily on 8/25,  then 1 tablet on 8/26-and then stop.   prochlorperazine 10 MG tablet Commonly  known as: COMPAZINE Take 1 tablet (10 mg total) by mouth every 6 (six) hours as needed for nausea or vomiting.   simvastatin 40 MG tablet Commonly known as: ZOCOR Take 1 tablet (40 mg total) by mouth every evening.   zaleplon 10 MG capsule Commonly known as: SONATA Take 10 mg by mouth at bedtime as needed for  sleep.   zinc sulfate 220 (50 Zn) MG capsule Take 1 capsule (220 mg total) by mouth daily.            Durable Medical Equipment  (From admission, onward)         Start     Ordered   05/17/20 1241  For home use only DME oxygen  Once       Question Answer Comment  Length of Need 6 Months   Mode or (Route) Nasal cannula   Liters per Minute 3   Frequency Continuous (stationary and portable oxygen unit needed)   Oxygen conserving device Yes   Oxygen delivery system Gas      05/17/20 1240          Follow-up Information    [facility], [facility] Follow up.   Specialty: Home Health Services Why: Your home health has been set up with [facility]. The agency will contact you with a start of service date. If you have any questions or concerns please call number listed above. Contact information: [address] [place] [address] [phone]        [name], MD. Schedule an appointment as soon as possible for a visit in 1 week(s).   Specialty: Family Medicine Contact information: [address] [place] [address] [phone]        Rollene Rotunda, MD. Schedule an appointment as soon as possible for a visit in 1 month(s).   Specialty: Cardiology Contact information: [address] [place] [address] [phone]              Allergies  Allergen Reactions  . Gala Murdoch [Fesoterodine] Nausea And Vomiting    Severe reaction - s/p several ER visits then hospitalization ?stress induced cardiomyopathy  . Cephalexin Hives  . Hydrocodone Other (See Comments)    Reaction:  Hallucinations   . Entresto  [Sacubitril-Valsartan]   . Tolterodine Tartrate Nausea And Vomiting  . Risperidone And Related Other (See Comments)    Reaction:  Made pt excessively sleepy  . Seroquel [Quetiapine Fumarate] Other (See Comments)    Reaction:  Made pt excessively sleepy  . Sulfa Antibiotics Rash     Other Procedures/Studies: CT HEAD WO CONTRAST  Result Date: 05/13/2020 CLINICAL DATA:  Fall with facial trauma. EXAM: CT HEAD WITHOUT CONTRAST TECHNIQUE: Contiguous axial images were obtained from the base of the skull through the vertex without intravenous contrast. COMPARISON:  05/26/2011 FINDINGS: Brain: Ventricles, cisterns and other CSF spaces are normal. There is no mass, mass effect, shift of midline structures or acute hemorrhage. No evidence of acute infarction. Vascular: No hyperdense vessel or unexpected calcification. Skull: Normal. Negative for fracture or focal lesion. Sinuses/Orbits: No acute finding. Other: None. IMPRESSION: No acute findings. Electronically Signed   By: Elberta Fortis M.D.   On: 05/13/2020 11:30   DG Chest Port 1 View  Result Date: 05/12/2020 CLINICAL DATA:  COVID positive shortness of breath EXAM: PORTABLE CHEST 1 VIEW COMPARISON:  May 03, 2020 FINDINGS: The heart size and mediastinal contours are unchanged with mild cardiomegaly. Aortic knob calcifications. Interval significant worsening in the multifocal patchy hazy airspace opacities throughout both lungs, right worse than left. No pleural effusion. No acute osseous abnormality. Cervical fixation hardware seen. IMPRESSION: Interval worsening in the multifocal airspace opacities consistent with pneumonia. Electronically Signed   By: Jonna Clark M.D.   On: 05/12/2020 22:26   DG Chest Port 1 View  Result Date: 05/03/2020 CLINICAL DATA:  Shortness of breath.  Hypoxia.  COVID-19 positive. EXAM: PORTABLE CHEST 1 VIEW COMPARISON:  Chest radiograph 10/31/2019 and CTA 11/09/2019 FINDINGS: The cardiac silhouette remains mildly enlarged.  Aortic atherosclerosis is noted. There is persistent mild elevation of the right hemidiaphragm. Compared to the prior radiograph, there is increased pulmonary vascular congestion with mild interstitial densities bilaterally. No sizable pleural effusion or pneumothorax is identified. Prior cervical fusion is noted. IMPRESSION: Cardiomegaly with pulmonary vascular congestion and mild bilateral interstitial opacities which may reflect edema or atypical/viral infection. Electronically Signed   By: Sebastian Ache M.D.   On: 05/03/2020 15:42   DG HIP UNILAT WITH PELVIS 2-3 VIEWS LEFT  Result Date: 05/13/2020 CLINICAL DATA:  Fall. EXAM: DG HIP (WITH OR WITHOUT PELVIS) 2-3V LEFT COMPARISON:  10/20/2016 FINDINGS: Left hip arthroplasty intact and unchanged. Moderate osteoarthritic change of the right hip. Stable irregularity over the inferior left acetabular region. Degenerate change of the spine with partially visualized fusion hardware intact over the lower lumbar spine. Mild degenerate change of the sacroiliac joints. IMPRESSION: 1. No acute findings. 2. Moderate osteoarthritic change of the right hip. Left hip arthroplasty intact. Electronically Signed   By: Elberta Fortis M.D.   On: 05/13/2020 11:28   SLEEP STUDY DOCUMENTS  Result Date: 04/25/2020 Ordered by an unspecified provider.    TODAY-DAY OF DISCHARGE:  Subjective:   Brendalee Matthies today has no headache,no chest abdominal pain,no new weakness tingling or numbness, feels much better wants to go home today.   Objective:   Blood pressure 121/60, pulse 70, temperature (!) 97.5 F (36.4 C), resp. rate 16, height 5\' 4"  (1.626 m), weight (!) 163.2 kg, SpO2 98 %.  Intake/Output Summary (Last 24 hours) at 05/22/2020 1141 Last data filed at 05/21/2020 2336 Gross per 24 hour  Intake 736.33 ml  Output 1150 ml  Net -413.67 ml   Filed Weights   05/20/20 0231 05/21/20 0456 05/22/20 0654  Weight: (!) 166.2 kg (!) 162.5 kg (!) 163.2 kg    Exam: Awake  Alert, Oriented *3, No new F.N deficits, Normal affect [place].AT,PERRAL Supple Neck,No JVD, No cervical lymphadenopathy appriciated.  Symmetrical Chest wall movement, Good air movement bilaterally, CTAB RRR,No Gallops,Rubs or new Murmurs, No Parasternal Heave +ve B.Sounds, Abd Soft, Non tender, No organomegaly appriciated, No rebound -guarding or rigidity. No Cyanosis, Clubbing or edema, No new Rash or bruise   PERTINENT RADIOLOGIC STUDIES: CT HEAD WO CONTRAST  Result Date: 05/13/2020 CLINICAL DATA:  Fall with facial trauma. EXAM: CT HEAD WITHOUT CONTRAST TECHNIQUE: Contiguous axial images were obtained from the base of the skull through the vertex without intravenous contrast. COMPARISON:  05/26/2011 FINDINGS: Brain: Ventricles, cisterns and other CSF spaces are normal. There is no mass, mass effect, shift of midline structures or acute hemorrhage. No evidence of acute infarction. Vascular: No hyperdense vessel or unexpected calcification. Skull: Normal. Negative for fracture or focal lesion. Sinuses/Orbits: No acute finding. Other: None. IMPRESSION: No acute findings. Electronically Signed   By: 05/28/2011 M.D.   On: 05/13/2020 11:30   DG Chest Port 1 View  Result Date: 05/12/2020 CLINICAL DATA:  COVID positive shortness of breath EXAM: PORTABLE CHEST 1 VIEW COMPARISON:  May 03, 2020 FINDINGS: The heart size and mediastinal contours are unchanged with mild cardiomegaly. Aortic knob calcifications. Interval significant worsening in the multifocal patchy hazy airspace opacities throughout both lungs, right worse than left. No pleural effusion. No acute osseous abnormality. Cervical fixation hardware seen. IMPRESSION: Interval worsening in the multifocal airspace opacities consistent with  pneumonia. Electronically Signed   By: Jonna Clark M.D.   On: 05/12/2020 22:26   DG Chest Port 1 View  Result Date: 05/03/2020 CLINICAL DATA:  Shortness of breath.  Hypoxia.  COVID-19 positive. EXAM: PORTABLE  CHEST 1 VIEW COMPARISON:  Chest radiograph 10/31/2019 and CTA 11/09/2019 FINDINGS: The cardiac silhouette remains mildly enlarged. Aortic atherosclerosis is noted. There is persistent mild elevation of the right hemidiaphragm. Compared to the prior radiograph, there is increased pulmonary vascular congestion with mild interstitial densities bilaterally. No sizable pleural effusion or pneumothorax is identified. Prior cervical fusion is noted. IMPRESSION: Cardiomegaly with pulmonary vascular congestion and mild bilateral interstitial opacities which may reflect edema or atypical/viral infection. Electronically Signed   By: Sebastian Ache M.D.   On: 05/03/2020 15:42   DG HIP UNILAT WITH PELVIS 2-3 VIEWS LEFT  Result Date: 05/13/2020 CLINICAL DATA:  Fall. EXAM: DG HIP (WITH OR WITHOUT PELVIS) 2-3V LEFT COMPARISON:  10/20/2016 FINDINGS: Left hip arthroplasty intact and unchanged. Moderate osteoarthritic change of the right hip. Stable irregularity over the inferior left acetabular region. Degenerate change of the spine with partially visualized fusion hardware intact over the lower lumbar spine. Mild degenerate change of the sacroiliac joints. IMPRESSION: 1. No acute findings. 2. Moderate osteoarthritic change of the right hip. Left hip arthroplasty intact. Electronically Signed   By: Elberta Fortis M.D.   On: 05/13/2020 11:28   SLEEP STUDY DOCUMENTS  Result Date: 04/25/2020 Ordered by an unspecified provider.    PERTINENT LAB RESULTS: CBC: No results for input(s): WBC, HGB, HCT, PLT in the last 72 hours. CMET CMP     Component Value Date/Time   NA 133 (L) 05/22/2020 1010   NA 131 (L) 09/14/2018 1138   NA 145 06/18/2015 0000   K 4.1 05/22/2020 1010   K 4.9 06/18/2015 0000   CL 87 (L) 05/22/2020 1010   CL 104 09/25/2012 1729   CO2 38 (H) 05/22/2020 1010   CO2 22 09/25/2012 1729   GLUCOSE 91 05/22/2020 1010   GLUCOSE 167 (H) 09/25/2012 1729   BUN 26 (H) 05/22/2020 1010   BUN 16 09/14/2018 1138     BUN 8 09/25/2012 1729   CREATININE 0.62 05/22/2020 1010   CREATININE 0.97 08/04/2019 1614   CALCIUM 9.1 05/22/2020 1010   CALCIUM 9.5 09/25/2012 1729   PROT 6.2 (L) 05/21/2020 0059   PROT 7.0 09/14/2018 1138   PROT 9.1 (H) 09/25/2012 1729   ALBUMIN 3.0 (L) 05/21/2020 0059   ALBUMIN 4.7 09/14/2018 1138   ALBUMIN 3.3 (L) 09/25/2012 1729   AST 41 05/21/2020 0059   AST 19 06/18/2015 0000   ALT 66 (H) 05/21/2020 0059   ALT 21 06/18/2015 0000   ALKPHOS 64 05/21/2020 0059   ALKPHOS 78 06/18/2015 0000   BILITOT 0.7 05/21/2020 0059   BILITOT 0.4 09/14/2018 1138   BILITOT 0.3 06/18/2015 0000   GFRNONAA >60 05/22/2020 1010   GFRNONAA 60 08/04/2019 1614   GFRAA >60 05/22/2020 1010   GFRAA 70 08/04/2019 1614    GFR Estimated Creatinine Clearance: 105.7 mL/min (by C-G formula based on SCr of 0.62 mg/dL). No results for input(s): LIPASE, AMYLASE in the last 72 hours. No results for input(s): CKTOTAL, CKMB, CKMBINDEX, TROPONINI in the last 72 hours. Invalid input(s): POCBNP Recent Labs    05/21/20 0059 05/22/20 0256  DDIMER 1.25* 1.29*   No results for input(s): HGBA1C in the last 72 hours. No results for input(s): CHOL, HDL, LDLCALC, TRIG, CHOLHDL, LDLDIRECT in the last 72  hours. No results for input(s): TSH, T4TOTAL, T3FREE, THYROIDAB in the last 72 hours.  Invalid input(s): FREET3 Recent Labs    05/21/20 0059 05/22/20 0256  FERRITIN 206 181   Coags: No results for input(s): INR in the last 72 hours.  Invalid input(s): PT Microbiology: Recent Results (from the past 240 hour(s))  Blood Culture (routine x 2)     Status: None   Collection Time: 05/12/20 10:33 PM   Specimen: BLOOD  Result Value Ref Range Status   Specimen Description BLOOD RIGHT FOREARM  Final   Special Requests   Final    BOTTLES DRAWN AEROBIC AND ANAEROBIC Blood Culture adequate volume   Culture   Final    NO GROWTH 5 DAYS Performed at [facility], [address] [place], [place] [address]     Report Status 05/17/2020 FINAL  Final    FURTHER DISCHARGE INSTRUCTIONS:  Get Medicines reviewed and adjusted: Please take all your medications with you for your next visit with your Primary MD  Laboratory/radiological data: Please request your Primary MD to go over all hospital tests and procedure/radiological results at the follow up, please ask your Primary MD to get all Hospital records sent to his/her office.  In some cases, they will be blood work, cultures and biopsy results pending at the time of your discharge. Please request that your primary care M.D. goes through all the records of your hospital data and follows up on these results.  Also Note the following: If you experience worsening of your admission symptoms, develop shortness of breath, life threatening emergency, suicidal or homicidal thoughts you must seek medical attention immediately by calling 911 or calling your MD immediately  if symptoms less severe.  You must read complete instructions/literature along with all the possible adverse reactions/side effects for all the Medicines you take and that have been prescribed to you. Take any new Medicines after you have completely understood and accpet all the possible adverse reactions/side effects.   Do not drive when taking Pain medications or sleeping medications (Benzodaizepines)  Do not take more than prescribed Pain, Sleep and Anxiety Medications. It is not advisable to combine anxiety,sleep and pain medications without talking with your primary care practitioner  Special Instructions: If you have smoked or chewed Tobacco  in the last 2 yrs please stop smoking, stop any regular Alcohol  and or any Recreational drug use.  Wear Seat belts while driving.  Please note: You were cared for by a hospitalist during your hospital stay. Once you are discharged, your primary care physician will handle any further medical issues. Please note that NO REFILLS for any discharge  medications will be authorized once you are discharged, as it is imperative that you return to your primary care physician (or establish a relationship with a primary care physician if you do not have one) for your post hospital discharge needs so that they can reassess your need for medications and monitor your lab values.  Total Time spent coordinating discharge including counseling, education and face to face time equals 35 minutes.  SignedJeoffrey Massed 05/22/2020 11:41 AM

## 2020-05-22 NOTE — Progress Notes (Signed)
Pt discharged home with daughter via car. AVS paperwork reviewed. Pt discharged home with O2 reviewed how to use the oxygen tank. All belongings sent home with pt. Answered all question pt had.

## 2020-05-22 NOTE — Consult Note (Signed)
Patient seen and case discussed with Dr. Dina Rich. 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 Patient is alert and oriented, calm and cooperative very pleasant. She states she has been on Trileptal for over 30 years, and is also on Lamictal 200mg   and has been stable. SHe is under the care of Dr. for medication management. She denies any recent or acute suicidal ideations, attempts, gestures or threats at this time. We discussed remaining on Lamictal 200mg  po daily, and obtaining an appointment with her psychiatrist this week. This was also communicated with her daughter. Explained to patient there is a likelihood of relapse in bipolar symptoms to include suicidal ideations. Both verbalized understanding. At the time of the assessment she denied suicidal ideations. Will psychiatric clear patient at this time. SHe does not appear to be a threat to herself or others at this time and does not meet inpatiet criteria or IVC criteria.

## 2020-05-23 ENCOUNTER — Telehealth: Payer: Self-pay | Admitting: Family Medicine

## 2020-05-23 ENCOUNTER — Other Ambulatory Visit: Payer: Self-pay | Admitting: *Deleted

## 2020-05-23 NOTE — Telephone Encounter (Signed)
FYI-Patient's daughter called to schedule hospital f/u for patient in a week.  Patient had covid and pneumonia and was discharged from [facility] yesterday. I asked Carollee Herter if I could schedule an office visit or virtual. Patient was diagnosed with covid on 05/03/20. Carollee Herter said she can come in to the office unless she has covid symptoms. Patient is still having shortness of breath from pneumonia and she's on oxygen.  I scheduled an office appointment for patient on 05/30/20. Patient's daughter said [facility] wanted lab work done at her office visit.

## 2020-05-23 NOTE — Patient Outreach (Signed)
[facility]) Care Management  05/23/2020  Amy Barnes 12-12-1950 [id number]   Notified that member discharged from hospital yesterday after 2nd admission with Covid infection/complications.  Call placed to member to follow up on recovery, state she is still a little weak but feeling better.  Explains that she does have coughin gfits if she is trying to talk for too long.  This care manager notes that she is not as short of breath while talking as she was during last telephone encounter on 8/12, prior to readmission.  State she was discharged on home O2 and will have [facility] for home health.  Her daughter has secured follow up appointment with PCP on 9/1.  She is concerned about being taken off of one of her psych meds, but daughter will contact MD to schedule virtual visit within the next week.  Denies any urgent concerns at this time, will follow up within the next month.  Encouraged to contact this care manager with any questions.  Goals Addressed              This Visit's Progress   .  Recover from Covid (pt-stated)   On track     CARE PLAN ENTRY (see longtitudinal plan of care for additional care plan information)  Current Barriers:  Marland Kitchen Knowledge Deficits related to COVID-19 and impact on patient self health management  Clinical Goal(s):  Marland Kitchen Over the next 30 days, patient will verbalize basic understanding of COVID-19 impact on individual health and self health management as evidenced by verbalization of basic understanding of COVID-19 as a viral disease, measures to prevent exposure, signs and symptoms, when to contact provider  Interventions: . Advised patient to call PCP for follow up . Discussed plans with patient for ongoing care management follow up and provided patient with direct contact information for care management team . Provided patient with EMMI  educational materials related to Covid recovery  Patient Self Care Activities:  . Attends all scheduled  provider appointments . Performs ADL's independently  Initial goal documentation                      Kemper Durie, RN, MSN [facility] Care Management  [facility] Manager [phone]

## 2020-05-23 NOTE — Telephone Encounter (Signed)
Noted. Agree. Thanks.

## 2020-05-24 ENCOUNTER — Telehealth (INDEPENDENT_AMBULATORY_CARE_PROVIDER_SITE_OTHER): Payer: Medicare Other

## 2020-05-24 ENCOUNTER — Telehealth: Payer: Self-pay | Admitting: *Deleted

## 2020-05-24 DIAGNOSIS — F319 Bipolar disorder, unspecified: Secondary | ICD-10-CM | POA: Diagnosis not present

## 2020-05-24 DIAGNOSIS — J432 Centrilobular emphysema: Secondary | ICD-10-CM | POA: Diagnosis not present

## 2020-05-24 DIAGNOSIS — D649 Anemia, unspecified: Secondary | ICD-10-CM | POA: Diagnosis not present

## 2020-05-24 DIAGNOSIS — E1142 Type 2 diabetes mellitus with diabetic polyneuropathy: Secondary | ICD-10-CM | POA: Diagnosis not present

## 2020-05-24 DIAGNOSIS — K5903 Drug induced constipation: Secondary | ICD-10-CM | POA: Diagnosis not present

## 2020-05-24 DIAGNOSIS — E039 Hypothyroidism, unspecified: Secondary | ICD-10-CM | POA: Diagnosis not present

## 2020-05-24 DIAGNOSIS — R300 Dysuria: Secondary | ICD-10-CM

## 2020-05-24 DIAGNOSIS — U071 COVID-19: Secondary | ICD-10-CM | POA: Diagnosis not present

## 2020-05-24 DIAGNOSIS — E785 Hyperlipidemia, unspecified: Secondary | ICD-10-CM | POA: Diagnosis not present

## 2020-05-24 DIAGNOSIS — J9601 Acute respiratory failure with hypoxia: Secondary | ICD-10-CM | POA: Diagnosis not present

## 2020-05-24 DIAGNOSIS — J1282 Pneumonia due to coronavirus disease 2019: Secondary | ICD-10-CM | POA: Diagnosis not present

## 2020-05-24 DIAGNOSIS — M8580 Other specified disorders of bone density and structure, unspecified site: Secondary | ICD-10-CM | POA: Diagnosis not present

## 2020-05-24 DIAGNOSIS — M170 Bilateral primary osteoarthritis of knee: Secondary | ICD-10-CM | POA: Diagnosis not present

## 2020-05-24 DIAGNOSIS — I700 Atherosclerosis of aorta: Secondary | ICD-10-CM | POA: Diagnosis not present

## 2020-05-24 DIAGNOSIS — I2510 Atherosclerotic heart disease of native coronary artery without angina pectoris: Secondary | ICD-10-CM | POA: Diagnosis not present

## 2020-05-24 DIAGNOSIS — G4733 Obstructive sleep apnea (adult) (pediatric): Secondary | ICD-10-CM | POA: Diagnosis not present

## 2020-05-24 DIAGNOSIS — G2581 Restless legs syndrome: Secondary | ICD-10-CM | POA: Diagnosis not present

## 2020-05-24 DIAGNOSIS — E871 Hypo-osmolality and hyponatremia: Secondary | ICD-10-CM | POA: Diagnosis not present

## 2020-05-24 DIAGNOSIS — I119 Hypertensive heart disease without heart failure: Secondary | ICD-10-CM | POA: Diagnosis not present

## 2020-05-24 DIAGNOSIS — I482 Chronic atrial fibrillation, unspecified: Secondary | ICD-10-CM | POA: Diagnosis not present

## 2020-05-24 DIAGNOSIS — M47818 Spondylosis without myelopathy or radiculopathy, sacral and sacrococcygeal region: Secondary | ICD-10-CM | POA: Diagnosis not present

## 2020-05-24 DIAGNOSIS — E1136 Type 2 diabetes mellitus with diabetic cataract: Secondary | ICD-10-CM | POA: Diagnosis not present

## 2020-05-24 DIAGNOSIS — K219 Gastro-esophageal reflux disease without esophagitis: Secondary | ICD-10-CM | POA: Diagnosis not present

## 2020-05-24 DIAGNOSIS — F418 Other specified anxiety disorders: Secondary | ICD-10-CM | POA: Diagnosis not present

## 2020-05-24 DIAGNOSIS — J45909 Unspecified asthma, uncomplicated: Secondary | ICD-10-CM | POA: Diagnosis not present

## 2020-05-24 LAB — POC URINALSYSI DIPSTICK (AUTOMATED)
Bilirubin, UA: NEGATIVE
Blood, UA: NEGATIVE
Glucose, UA: NEGATIVE
Ketones, UA: NEGATIVE
Nitrite, UA: NEGATIVE
Protein, UA: NEGATIVE
Spec Grav, UA: 1.01 (ref 1.010–1.025)
Urobilinogen, UA: 0.2 E.U./dL
pH, UA: 8 (ref 5.0–8.0)

## 2020-05-24 NOTE — Telephone Encounter (Signed)
Transition Care Management Follow-up Telephone Call  Date of discharge and from where: 05/22/2020, [facility]  How have you been since you were released from the hospital? Patient states that she believes she has a UTI. She is having dysuria that started last night. Wants something sent in for this if possible.  Any questions or concerns? Yes, See above. Patient is having dysuria. Needs something prescribed for UTI she states.   Items Reviewed:  Did the pt receive and understand the discharge instructions provided? Yes   Medications obtained and verified? Yes   Any new allergies since your discharge? No   Dietary orders reviewed? Yes  Do you have support at home? Yes   Functional Questionnaire: (I = Independent and D = Dependent) ADLs: I  Bathing/Dressing- I  Meal Prep- I  Eating- I  Maintaining continence- I  Transferring/Ambulation- I  Managing Meds- I  Follow up appointments reviewed:   PCP Hospital f/u appt confirmed? Yes  Scheduled to see Dr. Sharen Hones on 05/30/2020 @ 9 am.  Specialist Hospital f/u appt confirmed? Yes  Scheduled to see cardiology   Are transportation arrangements needed? No   If their condition worsens, is the pt aware to call PCP or go to the Emergency Dept.? Yes  Was the patient provided with contact information for the PCP's office or ED? Yes  Was to pt encouraged to call back with questions or concerns? Yes

## 2020-05-24 NOTE — Addendum Note (Signed)
Addended by: Nanci Pina on: 05/24/2020 05:08 PM   Modules accepted: Orders

## 2020-05-24 NOTE — Telephone Encounter (Signed)
Called and notified Jasmine December @ [facility] that the patient is out of the hospital and per patient is now on continuous  O2 . Jasmine December will add a bleeder to her BIPAP machine.

## 2020-05-24 NOTE — Telephone Encounter (Addendum)
Pt's urine sample dropped off and UA ran.  UCX drawn and placed in lab refrigerator.  Results documented.  FYI to Dr. Reece Agar and Dr. Para March.

## 2020-05-24 NOTE — Telephone Encounter (Addendum)
Spoke with pt relaying Dr. Timoteo Expose message.  Pt verbalizes understanding.  She will either drop off sample or have daughter pick up container to bring in urine sample.

## 2020-05-24 NOTE — Telephone Encounter (Signed)
Please have her collect urine in sterile container and drop off for UA in office.

## 2020-05-25 DIAGNOSIS — R300 Dysuria: Secondary | ICD-10-CM | POA: Diagnosis not present

## 2020-05-25 MED ORDER — NITROFURANTOIN MONOHYD MACRO 100 MG PO CAPS: 100.0000 mg | ORAL_CAPSULE | Freq: Two times a day (BID) | ORAL | 0 refills | Status: DC

## 2020-05-25 NOTE — Addendum Note (Signed)
Addended by: [name] on: 05/25/2020 07:39 AM   Modules accepted: Orders

## 2020-05-25 NOTE — Telephone Encounter (Signed)
UA suspicious for infection - start abx sent to pharmacy plz send culture.

## 2020-05-25 NOTE — Telephone Encounter (Signed)
Spoke with pt relaying Dr. Timoteo Expose message.  Pt verbalizes understanding.  UCX ordered.

## 2020-05-27 LAB — URINE CULTURE
MICRO NUMBER:: 10881911
SPECIMEN QUALITY:: ADEQUATE

## 2020-05-28 ENCOUNTER — Telehealth: Payer: Self-pay

## 2020-05-28 DIAGNOSIS — U071 COVID-19: Secondary | ICD-10-CM | POA: Diagnosis not present

## 2020-05-28 DIAGNOSIS — J432 Centrilobular emphysema: Secondary | ICD-10-CM | POA: Diagnosis not present

## 2020-05-28 DIAGNOSIS — J9601 Acute respiratory failure with hypoxia: Secondary | ICD-10-CM | POA: Diagnosis not present

## 2020-05-28 DIAGNOSIS — J1282 Pneumonia due to coronavirus disease 2019: Secondary | ICD-10-CM | POA: Diagnosis not present

## 2020-05-28 DIAGNOSIS — I119 Hypertensive heart disease without heart failure: Secondary | ICD-10-CM | POA: Diagnosis not present

## 2020-05-28 DIAGNOSIS — I2510 Atherosclerotic heart disease of native coronary artery without angina pectoris: Secondary | ICD-10-CM | POA: Diagnosis not present

## 2020-05-28 MED ORDER — AMOXICILLIN 875 MG PO TABS: 875.0000 mg | ORAL_TABLET | Freq: Two times a day (BID) | ORAL | 0 refills | Status: DC

## 2020-05-28 NOTE — Telephone Encounter (Signed)
Amoxicillin sent to pharmacy 7d course. Macrodantin added to allergy list.

## 2020-05-28 NOTE — Telephone Encounter (Signed)
Received call from patient's daughter, [name] who states patient has new puss-filled area to unspecified finger of right hand. Daughter could not remember which finger is was. Denies patient having any fevers, nausea, vomiting, diarrhea. Patient is following up with PCP on 05/30/20 and wanted to know which labs Dr. Drue Second usually runs to in order to make PCP aware to add to labs they may draw. Advised daughter of most recent labs drawn at [facility] were Sed rate and CRP. Daughter will keep [facility] updated if antibiotics are prescribed by PCP. Valarie Cones

## 2020-05-28 NOTE — Telephone Encounter (Signed)
Dr Reece Agar said per urine culture resistant to macrobid. Pt will not take anymore of Macrobid. Dr Reece Agar wants to know if pt has tolerated plain amoxicillin before. I spoke with [name] and pt and pt thinks she has taken plain amoxicillin before without problems. Pt does not think she has fever but vomit x 1 earlier this morning zofran helped vomiting for now.[name] is not sure if vomiting could be from detoxing and [name] is going to contact psychiatrist. Pt also wants to know if she might have a kidney stone due to not voiding her usual amt of urine; no blood in urine but pt is on restrictive fluids and urine is very dark. Pt said can [facility] the [facility] [place]. Pt or [name] will cb if problems prior to appt on 05/30/20. Should the macrobid be put on allergy list?

## 2020-05-28 NOTE — Telephone Encounter (Signed)
Spoke with pt relaying Dr. G's message. Pt verbalizes understanding.  

## 2020-05-28 NOTE — Telephone Encounter (Signed)
Amy Barnes (DPR signed) left v/m wanting to verfiy that macrobid does not have sulfa in it because over weekend pt had ? Reaction to something. I spoke with Amy Barnes (DPR signed) and advised that macrodantin has nitrofurantoin in it; pt said she stopped abx over weekend due to SOB; O2 stats were good. Pt thought her lips felt like they were getting chapped.  Amy Barnes said that SOB and chapped lips went away after stopping the macrodantin. Pt continues with pain that she was having prior to starting abx. Amy Barnes said pt only took couple or three doses of abx before stopping med.  Amy Barnes request cb when Dr Reece Agar reviews note to see if pt should restart the macrobid or should pt have different abx. [facility] [place]; I have not put macrobid on allergy list until reviewed by Dr Reece Agar. Pt has in office appt to see Dr Reece Agar on 05/30/20 at 9 AM. Please advise.

## 2020-05-29 ENCOUNTER — Other Ambulatory Visit: Payer: Self-pay | Admitting: *Deleted

## 2020-05-29 DIAGNOSIS — J9601 Acute respiratory failure with hypoxia: Secondary | ICD-10-CM | POA: Diagnosis not present

## 2020-05-29 DIAGNOSIS — J432 Centrilobular emphysema: Secondary | ICD-10-CM | POA: Diagnosis not present

## 2020-05-29 DIAGNOSIS — J1282 Pneumonia due to coronavirus disease 2019: Secondary | ICD-10-CM | POA: Diagnosis not present

## 2020-05-29 DIAGNOSIS — U071 COVID-19: Secondary | ICD-10-CM | POA: Diagnosis not present

## 2020-05-29 DIAGNOSIS — I2510 Atherosclerotic heart disease of native coronary artery without angina pectoris: Secondary | ICD-10-CM | POA: Diagnosis not present

## 2020-05-29 DIAGNOSIS — I119 Hypertensive heart disease without heart failure: Secondary | ICD-10-CM | POA: Diagnosis not present

## 2020-05-29 NOTE — Patient Outreach (Signed)
[facility]) Care Management  05/29/2020  Amy Barnes Jul 27, 1951 [id number]   RED ON EMMI ALERT - General Discharge Day # 4 Date: 8/30 Red Alert Reason: Other questions/problems   Outreach attempt #1, successful.  Member report she has started to have issues with constipation.  State she has begun to take Miralax as prescribed, only had one dose today.  Advised of instructions to take twice daily, she will try again later today and tomorrow if no relief.  State she also has an appointment with PCP tomorrow, will discuss at that time.  Denies any urgent concerns.   Plan: RN CM will follow up within the next month as planned, advised to contact this care manager with any questions.  Kemper Durie, [place], MSN [facility] Care Management  [facility] Manager [phone]

## 2020-05-30 ENCOUNTER — Other Ambulatory Visit: Payer: Self-pay

## 2020-05-30 ENCOUNTER — Ambulatory Visit (INDEPENDENT_AMBULATORY_CARE_PROVIDER_SITE_OTHER): Payer: Medicare Other | Admitting: Family Medicine

## 2020-05-30 ENCOUNTER — Encounter: Payer: Self-pay | Admitting: Family Medicine

## 2020-05-30 VITALS — BP 134/80 | HR 86 | Temp 97.6°F | Ht 64.0 in | Wt 350.4 lb

## 2020-05-30 DIAGNOSIS — I10 Essential (primary) hypertension: Secondary | ICD-10-CM | POA: Diagnosis not present

## 2020-05-30 DIAGNOSIS — Z8616 Personal history of COVID-19: Secondary | ICD-10-CM

## 2020-05-30 DIAGNOSIS — I5021 Acute systolic (congestive) heart failure: Secondary | ICD-10-CM | POA: Diagnosis not present

## 2020-05-30 DIAGNOSIS — E871 Hypo-osmolality and hyponatremia: Secondary | ICD-10-CM

## 2020-05-30 DIAGNOSIS — J1282 Pneumonia due to coronavirus disease 2019: Secondary | ICD-10-CM | POA: Diagnosis not present

## 2020-05-30 DIAGNOSIS — Z66 Do not resuscitate: Secondary | ICD-10-CM | POA: Diagnosis not present

## 2020-05-30 DIAGNOSIS — R112 Nausea with vomiting, unspecified: Secondary | ICD-10-CM | POA: Diagnosis not present

## 2020-05-30 DIAGNOSIS — R600 Localized edema: Secondary | ICD-10-CM | POA: Diagnosis not present

## 2020-05-30 DIAGNOSIS — U071 COVID-19: Secondary | ICD-10-CM

## 2020-05-30 DIAGNOSIS — E1142 Type 2 diabetes mellitus with diabetic polyneuropathy: Secondary | ICD-10-CM | POA: Diagnosis not present

## 2020-05-30 DIAGNOSIS — N390 Urinary tract infection, site not specified: Secondary | ICD-10-CM

## 2020-05-30 DIAGNOSIS — K219 Gastro-esophageal reflux disease without esophagitis: Secondary | ICD-10-CM

## 2020-05-30 DIAGNOSIS — J9601 Acute respiratory failure with hypoxia: Secondary | ICD-10-CM

## 2020-05-30 DIAGNOSIS — A4902 Methicillin resistant Staphylococcus aureus infection, unspecified site: Secondary | ICD-10-CM | POA: Diagnosis not present

## 2020-05-30 DIAGNOSIS — R1084 Generalized abdominal pain: Secondary | ICD-10-CM

## 2020-05-30 DIAGNOSIS — F3110 Bipolar disorder, current episode manic without psychotic features, unspecified: Secondary | ICD-10-CM

## 2020-05-30 LAB — CBC WITH DIFFERENTIAL/PLATELET
Basophils Absolute: 0.1 10*3/uL (ref 0.0–0.1)
Basophils Relative: 1.2 % (ref 0.0–3.0)
Eosinophils Absolute: 0 10*3/uL (ref 0.0–0.7)
Eosinophils Relative: 0.5 % (ref 0.0–5.0)
HCT: 36.6 % (ref 36.0–46.0)
Hemoglobin: 11.8 g/dL — ABNORMAL LOW (ref 12.0–15.0)
Lymphocytes Relative: 10.7 % — ABNORMAL LOW (ref 12.0–46.0)
Lymphs Abs: 0.9 10*3/uL (ref 0.7–4.0)
MCHC: 32.3 g/dL (ref 30.0–36.0)
MCV: 85.3 fl (ref 78.0–100.0)
Monocytes Absolute: 1 10*3/uL (ref 0.1–1.0)
Monocytes Relative: 11.8 % (ref 3.0–12.0)
Neutro Abs: 6.3 10*3/uL (ref 1.4–7.7)
Neutrophils Relative %: 75.8 % (ref 43.0–77.0)
Platelets: 330 10*3/uL (ref 150.0–400.0)
RBC: 4.29 Mil/uL (ref 3.87–5.11)
RDW: 17.1 % — ABNORMAL HIGH (ref 11.5–15.5)
WBC: 8.3 10*3/uL (ref 4.0–10.5)

## 2020-05-30 LAB — COMPREHENSIVE METABOLIC PANEL
ALT: 34 U/L (ref 0–35)
AST: 21 U/L (ref 0–37)
Albumin: 4.1 g/dL (ref 3.5–5.2)
Alkaline Phosphatase: 67 U/L (ref 39–117)
BUN: 9 mg/dL (ref 6–23)
CO2: 30 mEq/L (ref 19–32)
Calcium: 9.2 mg/dL (ref 8.4–10.5)
Chloride: 96 mEq/L (ref 96–112)
Creatinine, Ser: 0.51 mg/dL (ref 0.40–1.20)
GFR: 119.93 mL/min (ref >60.00)
Glucose, Bld: 170 mg/dL — ABNORMAL HIGH (ref 70–99)
Potassium: 4.4 mEq/L (ref 3.5–5.1)
Sodium: 135 mEq/L (ref 135–145)
Total Bilirubin: 0.6 mg/dL (ref 0.2–1.2)
Total Protein: 6.3 g/dL (ref 6.0–8.3)

## 2020-05-30 LAB — BRAIN NATRIURETIC PEPTIDE: Pro B Natriuretic peptide (BNP): 285 pg/mL — ABNORMAL HIGH (ref 0.0–100.0)

## 2020-05-30 LAB — C-REACTIVE PROTEIN: CRP: 2.9 mg/dL (ref 0.5–20.0)

## 2020-05-30 LAB — LIPASE: Lipase: 15 U/L (ref 11.0–59.0)

## 2020-05-30 LAB — SEDIMENTATION RATE: Sed Rate: 75 mm/hr — ABNORMAL HIGH (ref 0–30)

## 2020-05-30 MED ORDER — PROMETHAZINE HCL 25 MG/ML IJ SOLN
25.0000 mg | Freq: Once | INTRAMUSCULAR | Status: AC
  Administered 2020-05-30: 25 mg via INTRAMUSCULAR

## 2020-05-30 NOTE — Progress Notes (Signed)
This visit was conducted in person.  BP 134/80 (BP Location: Left Arm, Patient Position: Sitting, Cuff Size: Large) Comment (Cuff Size): thigh  Pulse 86   Temp 97.6 F (36.4 C) (Temporal)   Ht 5\' 4"  (1.626 m)   Wt (!) 350 lb 7 oz (159 kg)   LMP  (LMP Unknown)   SpO2 93% Comment: RA  BMI 60.15 kg/m   98% on 2L [place].  93% on RA.   CC: hosp f/u visit  Subjective:    Patient ID: , female    DOB: 1950/12/19, 69 y.o.   MRN: [id number]  HPI: Amy Barnes is a 69 y.o. female presenting on 05/30/2020 for Hospitalization Follow-up (C/o stomach cramping, nausea and vomiting.  Pt accompanied by daughter, 07/30/2020- temp 97.9.)   Recent hospitalization with COVID pneumonia x2. Initially admitted 05/03/2020 and treated with solumedrol, remdesivir, spiriva respimat, rec supplemental oxygen to maintain SPO2 >94%. During initial hospitalization she did have Proteus UTI treated with 5d cefixime (Suprax). 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 Re-hospitalized 05/12/2020 with worsening dyspnea thought due to ongoing COVID19 PNA inflammation. Treated with steroids with slow improvement. CXR showed interval worsening of multifocal airspace opacities. Trileptal stopped during hospitalization due to hyponatremia (?med related vs SIADH - had previously been tolerating trileptal well). Previously on trileptal 300mg  in am and 600mg  at night. Upcoming psychiatry appt end of September. Continues lamictal 200mg  daily and prozac 40mg  daily. Losartan and hydralazine were also held upon discharge.   Not feeling well. Increasing lower abd cramping. Monday morning vomited x1. Yesterday morning increased constipation managed with miralax 1/2 capful daily and suppository without benefit. Nausea, vomiting, constipation, indigestion and bloating. Passing flatus ok.  No GERD.  No fevers since home.  No chest pain.   UTI - see recent phone note - new symptoms of dysuria started 05/23/2020. UA  collected - abnormal - started empirically on macrobid. UCx grew >100k proteus resistant to macrobid so we changed Rx to amoxicillin 7d course - has not yet started. Macrobid may have caused worsening dyspnea and peeling lips/sores so she had already stopped this. Continued incomplete emptying and dysuria. Has been managing nausea with zofran and phenergan however worried zofran may be worsening constipation.   Feeling similar to 12/2019 where she was found to have non-STEMI with acute sCHF exacerbation. Entresto changed to losartan at that time (stopped at latest hospitalization). Lasix increased to 40mg  daily.   Daughter asks about DNR form - verified with patient. [employer] form filled out today for patient to have accessible at home.   Discharged with plans for [facility] to establish care.   Initial hospitalization: Admit date: 05/03/2020 Discharge date: 05/08/2020  Repeat hospitalization: Admit Date: 05/12/2020 Discharge date: 05/22/2020 TCM hosp f/u phone call completed 05/24/2020  Recommendations for Outpatient Follow-up:  1. Follow up with PCP in 1-2 weeks 2. Please obtain CMP/CBC in one week 3. Repeat Chest Xray in 4-6 week 4. 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 PCP to reassess whether patient still requires home O2 at next visit 6. Hydralazine/losartan on hold-resume when able  Discharge Diagnoses:  Active Problems:   Hypothyroidism   DM type 2 with diabetic peripheral neuropathy (HCC)   CAD (coronary artery disease)   Centrilobular emphysema (HCC)   Hyponatremia   HLD (hyperlipidemia)   COVID-19  Admitted From:  Home Disposition: Home with home health services Home Health: Yes Equipment/Devices: oxygen  3L Discharge Condition: Stable CODE STATUS: FULL CODE     Relevant past medical, surgical, family and social history reviewed and updated as indicated. Interim medical  history since our last visit reviewed. Allergies and medications reviewed and updated. Outpatient Medications Prior to Visit  Medication Sig Dispense Refill  . albuterol (PROVENTIL HFA;VENTOLIN HFA) 108 (90 Base) MCG/ACT inhaler Inhale 2 puffs into the lungs every 6 (six) hours as needed for wheezing or shortness of breath. 1 Inhaler 6  . ascorbic acid (VITAMIN C) 500 MG tablet Take 1 tablet (500 mg total) by mouth daily. 30 tablet 0  . aspirin 81 MG EC tablet Take 81 mg by mouth at bedtime.     . budesonide-formoterol (SYMBICORT) 160-4.5 MCG/ACT inhaler Inhale 2 puffs into the lungs 2 (two) times daily. 1 Inhaler 3  . dicyclomine (BENTYL) 20 MG tablet Take 1 tablet (20 mg total) by mouth in the morning and at bedtime. (Patient taking differently: Take 20 mg by mouth in the morning and at bedtime. As needed) 60 tablet 2  . doxycycline (VIBRA-TABS) 100 MG tablet Take 1 tablet (100 mg total) by mouth 2 (two) times daily. 180 tablet 3  . feeding supplement, ENSURE ENLIVE, (ENSURE ENLIVE) LIQD Take 237 mLs by mouth 2 (two) times daily between meals. 14220 mL 0  . FLUoxetine (PROZAC) 40 MG capsule Take 40 mg by mouth daily.     . furosemide (LASIX) 40 MG tablet Take 1 tablet (40 mg total) by mouth daily. 30 tablet 0  . lamoTRIgine (LAMICTAL) 200 MG tablet Take 200 mg by mouth daily.      Marland Kitchen levothyroxine (SYNTHROID) 150 MCG tablet TAKE ONE TABLET BY MOUTH AT BEDTIME (Patient taking differently: Take 150 mcg by mouth daily before breakfast. ) 90 tablet 3  . metFORMIN (GLUCOPHAGE) 500 MG tablet Take 1 tablet (500 mg total) by mouth every evening. 30 tablet 11  . montelukast (SINGULAIR) 10 MG tablet Take 1 tablet (10 mg total) by mouth at bedtime. 90 tablet 3  . Multiple Vitamin (MULTIVITAMIN WITH MINERALS) TABS tablet Take 2 tablets by mouth daily.     . ondansetron (ZOFRAN ODT) 8 MG disintegrating tablet Take 1 tablet (8 mg total) by mouth 2 (two) times daily as needed for nausea or vomiting. 10 tablet 0    . pantoprazole (PROTONIX) 40 MG tablet Take 1 tablet (40 mg total) by mouth daily.    . polyethylene glycol (MIRALAX / GLYCOLAX) 17 g packet Take 17 g by mouth 2 (two) times daily as needed for mild constipation. 14 each 0  . prochlorperazine (COMPAZINE) 10 MG tablet Take 1 tablet (10 mg total) by mouth every 6 (six) hours as needed for nausea or vomiting. 30 tablet 1  . simvastatin (ZOCOR) 40 MG tablet Take 1 tablet (40 mg total) by mouth every evening. 90 tablet 1  . zaleplon (SONATA) 10 MG capsule Take 10 mg by mouth at bedtime as needed for sleep.    Marland Kitchen zinc sulfate 220 (50 Zn) MG capsule Take 1 capsule (220 mg total) by mouth daily. 30 capsule 0  . pantoprazole (PROTONIX) 40 MG tablet Take 1 tablet (40 mg total) by mouth 2 (two) times daily. (Patient taking differently: Take 40 mg by mouth daily. ) 180 tablet 1  . amoxicillin (AMOXIL) 875 MG tablet Take 1 tablet (875 mg total) by mouth 2 (two) times daily. (Patient not taking: Reported on 05/30/2020) 14 tablet 0  . carvedilol (COREG) 6.25 MG tablet Take 1 tablet (  6.25 mg total) by mouth 2 (two) times daily. 60 tablet 5  . Oxcarbazepine (TRILEPTAL) 300 MG tablet Take 1 tablet (300 mg total) by mouth daily.    . predniSONE (DELTASONE) 10 MG tablet Take 2 tablets daily on 8/25,  then 1 tablet on 8/26-and then stop. 3 tablet 0   No facility-administered medications prior to visit.     Per HPI unless specifically indicated in ROS section below Review of Systems Objective:  BP 134/80 (BP Location: Left Arm, Patient Position: Sitting, Cuff Size: Large) Comment (Cuff Size): thigh  Pulse 86   Temp 97.6 F (36.4 C) (Temporal)   Ht 5\' 4"  (1.626 m)   Wt (!) 350 lb 7 oz (159 kg)   LMP  (LMP Unknown)   SpO2 93% Comment: RA  BMI 60.15 kg/m   Wt Readings from Last 3 Encounters:  05/30/20 (!) 350 lb 7 oz (159 kg)  05/22/20 (!) 359 lb 12.7 oz (163.2 kg)  05/08/20 (!) 374 lb 5.5 oz (169.8 kg)      Physical Exam Vitals and nursing note reviewed.   Constitutional:      General: She is not in acute distress.    Appearance: She is obese. She is ill-appearing.     Comments: Sitting in wheelchair  Cardiovascular:     Rate and Rhythm: Normal rate and regular rhythm.     Pulses: Normal pulses.     Heart sounds: Normal heart sounds. No murmur heard.   Pulmonary:     Effort: Pulmonary effort is normal. No respiratory distress.     Breath sounds: No wheezing, rhonchi or rales.     Comments: No crackles, mildly coarse breath sounds throughout Abdominal:     General: Bowel sounds are normal.     Palpations: Abdomen is soft.     Tenderness: There is generalized abdominal tenderness (mild-mod). There is no guarding or rebound. Negative signs include Murphy's sign.  Musculoskeletal:     Right lower leg: No edema.     Left lower leg: No edema.  Skin:    General: Skin is warm and dry.     Findings: Ecchymosis (to forearms) present. No rash.  Neurological:     Mental Status: She is alert.       Results for orders placed or performed in visit on 05/30/20  Comprehensive metabolic panel  Result Value Ref Range   Sodium 135 135 - 145 mEq/L   Potassium 4.4 3.5 - 5.1 mEq/L   Chloride 96 96 - 112 mEq/L   CO2 30 19 - 32 mEq/L   Glucose, Bld 170 (H) 70 - 99 mg/dL   BUN 9 6 - 23 mg/dL   Creatinine, Ser 2.67 0.40 - 1.20 mg/dL   Total Bilirubin 0.6 0.2 - 1.2 mg/dL   Alkaline Phosphatase 67 39 - 117 U/L   AST 21 0 - 37 U/L   ALT 34 0 - 35 U/L   Total Protein 6.3 6.0 - 8.3 g/dL   Albumin 4.1 3.5 - 5.2 g/dL   GFR 124.58 >09.98 mL/min   Calcium 9.2 8.4 - 10.5 mg/dL  CBC with Differential/Platelet  Result Value Ref Range   WBC 8.3 4.0 - 10.5 K/uL   RBC 4.29 3.87 - 5.11 Mil/uL   Hemoglobin 11.8 (L) 12.0 - 15.0 g/dL   HCT 33.8 36 - 46 %   MCV 85.3 78.0 - 100.0 fl   MCHC 32.3 30.0 - 36.0 g/dL   RDW 25.0 (H) 53.9 - 76.7 %  Platelets 330.0 150 - 400 K/uL   Neutrophils Relative % 75.8 43 - 77 %   Lymphocytes Relative 10.7 (L) 12 - 46 %    Monocytes Relative 11.8 3 - 12 %   Eosinophils Relative 0.5 0 - 5 %   Basophils Relative 1.2 0 - 3 %   Neutro Abs 6.3 1.4 - 7.7 K/uL   Lymphs Abs 0.9 0.7 - 4.0 K/uL   Monocytes Absolute 1.0 0 - 1 K/uL   Eosinophils Absolute 0.0 0 - 0 K/uL   Basophils Absolute 0.1 0 - 0 K/uL  Sedimentation rate  Result Value Ref Range   Sed Rate 75 (H) 0 - 30 mm/hr  C-reactive protein  Result Value Ref Range   CRP 2.9 0.5 - 20.0 mg/dL  Brain natriuretic peptide  Result Value Ref Range   Pro B Natriuretic peptide (BNP) 285.0 (H) 0.0 - 100.0 pg/mL  Lipase  Result Value Ref Range   Lipase 15.0 11 - 59 U/L   Lab Results  Component Value Date   CKTOTAL 18 09/04/2012   CKMB 0.6 06/30/2012   TROPONINI 0.03 11/26/2015   Assessment & Plan:  This visit occurred during the SARS-CoV-2 public health emergency.  Safety protocols were in place, including screening questions prior to the visit, additional usage of staff PPE, and extensive cleaning of exam room while observing appropriate contact time as indicated for disinfecting solutions.   Problem List Items Addressed This Visit    UTI (urinary tract infection)    She was treated for Proteus UTI with Suprax during initial hospitalization (seems she tolerated well despite prior urticarial reaction to keflex). Rpt UCx again with Proteus UTI - with ongoing symptoms of dysuria, abd pain, nausea. Encouraged she start amoxicillin course.       Pneumonia due to COVID-19 virus - Primary    Completed remdesivir, solumedrol, now completing prednisone course post COVID pneumonia. Continues with supplemental oxygen at 2L to maintain O2 sats >94%. When oxygen removed, SpO2 drops to 93%. Will continue supplemental oxygen use for now, with ongoing attempt to wean when at rest.       Relevant Orders   Comprehensive metabolic panel (Completed)   CBC with Differential/Platelet (Completed)   Sedimentation rate (Completed)   C-reactive protein (Completed)   Lipase  (Completed)   Pedal edema    Significant improvement on higher lasix dose - continue.       Nausea and vomiting   MRSA (methicillin resistant Staphylococcus aureus) infection    H/o hardware infection. Continues doxycycline followed by ortho and ID.       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 Recheck today, if normalizing, consider restarting lower dose trileptal while she gets back in with psychiatrist.       GERD    Now on daily PPI       Relevant Medications   pantoprazole (PROTONIX) 40 MG tablet   Essential hypertension    Losartan and hydralazine currently on hold.  Lasix was increased to 40mg  daily.  Continue current regimen. Low threshold to restart ARB if BP trending up. Check labs today.       DNR (do not resuscitate)    Reviewed with patient and daughter. Provided with [employer] DNR order form to have at home.       DM type 2 with diabetic peripheral neuropathy (HCC)    Recent steroid induced hyperglycemia while hospitalized treated with insulin. As she tapers prednisone dose anticipate resolution  of insulin need. Continue metformin at this time.       Relevant Medications   Oxcarbazepine (TRILEPTAL) 300 MG tablet   Diffuse abdominal pain    With nausea, vomiting, constipation but still passing gas and small stool pointing against obstruction. Discussed miralax use. Discussed anti nausea med. IM phenergan 25mg  today. Further evaluate with labwork today. GI workup earlier this year largely unrevealing, seems it previously was presentation of CHF exacerbation. Check BNP today.       Bipolar disorder (HCC)    Continues lamictal and prozac, off trileptal due to hyponatremia. Upcoming psych appt at end of this month. Update Na level and if normal consider restarting lower dose trileptal.       Acute respiratory failure with hypoxia (HCC)    This seems to be slowly improving - discussed ongoing attempt to taper O2 at home  while at rest, but use with activity.      Acute HFrEF (heart failure with reduced ejection fraction) (HCC)   Relevant Orders   Brain natriuretic peptide (Completed)       Meds ordered this encounter  Medications  . promethazine (PHENERGAN) injection 25 mg   Orders Placed This Encounter  Procedures  . Comprehensive metabolic panel  . CBC with Differential/Platelet  . Sedimentation rate  . C-reactive protein  . Brain natriuretic peptide  . Lipase    Patient Instructions  Labs today  Phenergan 25mg  injection today.  May use phenergan and compazine at home.  Start amoxicillin course for UTI.  Continue wean off oxygen.    Follow up plan: No follow-ups on file.  , MD

## 2020-05-30 NOTE — Patient Instructions (Addendum)
Labs today  Phenergan 25mg  injection today.  May use phenergan and compazine at home.  Start amoxicillin course for UTI.  Continue wean off oxygen.

## 2020-05-31 ENCOUNTER — Telehealth: Payer: Self-pay | Admitting: *Deleted

## 2020-05-31 ENCOUNTER — Encounter: Payer: Self-pay | Admitting: Family Medicine

## 2020-05-31 DIAGNOSIS — I119 Hypertensive heart disease without heart failure: Secondary | ICD-10-CM | POA: Diagnosis not present

## 2020-05-31 DIAGNOSIS — U071 COVID-19: Secondary | ICD-10-CM | POA: Diagnosis not present

## 2020-05-31 DIAGNOSIS — J1282 Pneumonia due to coronavirus disease 2019: Secondary | ICD-10-CM | POA: Diagnosis not present

## 2020-05-31 DIAGNOSIS — J432 Centrilobular emphysema: Secondary | ICD-10-CM | POA: Diagnosis not present

## 2020-05-31 DIAGNOSIS — Z66 Do not resuscitate: Secondary | ICD-10-CM | POA: Insufficient documentation

## 2020-05-31 DIAGNOSIS — J9601 Acute respiratory failure with hypoxia: Secondary | ICD-10-CM | POA: Diagnosis not present

## 2020-05-31 DIAGNOSIS — I2510 Atherosclerotic heart disease of native coronary artery without angina pectoris: Secondary | ICD-10-CM | POA: Diagnosis not present

## 2020-05-31 NOTE — Assessment & Plan Note (Addendum)
Losartan and hydralazine currently on hold.  Lasix was increased to 40mg  daily.  Continue current regimen. Low threshold to restart ARB if BP trending up. Check labs today.

## 2020-05-31 NOTE — Assessment & Plan Note (Signed)
Reviewed with patient and daughter. Provided with [employer] DNR order form to have at home.

## 2020-05-31 NOTE — Assessment & Plan Note (Signed)
Completed remdesivir, solumedrol, now completing prednisone course post COVID pneumonia. Continues with supplemental oxygen at 2L to maintain O2 sats >94%. When oxygen removed, SpO2 drops to 93%. Will continue supplemental oxygen use for now, with ongoing attempt to wean when at rest.

## 2020-05-31 NOTE — Assessment & Plan Note (Signed)
She was treated for Proteus UTI with Suprax during initial hospitalization (seems she tolerated well despite prior urticarial reaction to keflex). Rpt UCx again with Proteus UTI - with ongoing symptoms of dysuria, abd pain, nausea. Encouraged she start amoxicillin course.

## 2020-05-31 NOTE — Assessment & Plan Note (Signed)
Recent steroid induced hyperglycemia while hospitalized treated with insulin. As she tapers prednisone dose anticipate resolution of insulin need. Continue metformin at this time.

## 2020-05-31 NOTE — Assessment & Plan Note (Signed)
Recent hyponatremia ?related to SIADH vs medication (trileptal). Recheck today, if normalizing, consider restarting lower dose trileptal while she gets back in with psychiatrist.

## 2020-05-31 NOTE — Assessment & Plan Note (Addendum)
H/o hardware infection. Continues doxycycline followed by ortho and ID.

## 2020-05-31 NOTE — Assessment & Plan Note (Addendum)
This seems to be slowly improving - discussed ongoing attempt to taper O2 at home while at rest, but use with activity.

## 2020-05-31 NOTE — Assessment & Plan Note (Signed)
Continues lamictal and prozac, off trileptal due to hyponatremia. Upcoming psych appt at end of this month. Update Na level and if normal consider restarting lower dose trileptal.

## 2020-05-31 NOTE — Assessment & Plan Note (Addendum)
Now on daily PPI

## 2020-05-31 NOTE — Telephone Encounter (Signed)
Received a call from [facility] with [facility]  Statiing the patient called to say that her mask is " farting." patient has a BIPAP unit with O2 being bled into it. Jasmine December states due to multiple health issues she feels the patient may not be able to tolerate the machine. She also told her to become compliant she can also try using the machine during the day as well.

## 2020-05-31 NOTE — Assessment & Plan Note (Addendum)
With nausea, vomiting, constipation but still passing gas and small stool pointing against obstruction. Discussed miralax use. Discussed anti nausea med. IM phenergan 25mg  today. Further evaluate with labwork today. GI workup earlier this year largely unrevealing, seems it previously was presentation of CHF exacerbation. Check BNP today.

## 2020-05-31 NOTE — Assessment & Plan Note (Signed)
Significant improvement on higher lasix dose - continue.

## 2020-06-01 ENCOUNTER — Telehealth: Payer: Self-pay | Admitting: Gastroenterology

## 2020-06-01 ENCOUNTER — Other Ambulatory Visit: Payer: Self-pay | Admitting: Family Medicine

## 2020-06-01 ENCOUNTER — Encounter: Payer: Self-pay | Admitting: Family Medicine

## 2020-06-01 ENCOUNTER — Telehealth: Payer: Self-pay

## 2020-06-01 DIAGNOSIS — E871 Hypo-osmolality and hyponatremia: Secondary | ICD-10-CM

## 2020-06-01 MED ORDER — PROMETHAZINE HCL 25 MG RE SUPP: 25.0000 mg | Freq: Four times a day (QID) | RECTAL | 0 refills | Status: DC | PRN

## 2020-06-01 MED ORDER — PROMETHAZINE HCL 25 MG/ML IJ SOLN: 25.0000 mg | Freq: Once | INTRAMUSCULAR | 0 refills | Status: DC

## 2020-06-01 MED ORDER — ONDANSETRON 8 MG PO TBDP: 8.0000 mg | ORAL_TABLET | Freq: Two times a day (BID) | ORAL | 0 refills | Status: DC | PRN

## 2020-06-01 NOTE — Telephone Encounter (Signed)
Rxs sent and pt and [facility] nurse notified. Clementeen Hoof TE, 06/01/20]

## 2020-06-01 NOTE — Telephone Encounter (Signed)
Script sent to the pharmacy. 

## 2020-06-01 NOTE — Telephone Encounter (Signed)
IM phenergan sent in.  Also sent in phenergan suppositories to use in place of PO.  Ok to start trileptal 300mg  nightly.

## 2020-06-01 NOTE — Telephone Encounter (Addendum)
Spoke with daughter, reviewed lab results.  Discussed current symptoms.  ER precautions reviewed.  [facility] did not receive phenergan IM injection - requests Rx re[facility]sent [facility] local [facility].

## 2020-06-01 NOTE — Telephone Encounter (Signed)
Spoke with pt relaying Dr. Timoteo Expose message.  Pt verbalizes understanding and expresses her thanks.   Spoke with Tyson Alias, [facility] RN, relaying Dr. Timoteo Expose message.  She verbalizes understanding and expresses her thanks.

## 2020-06-01 NOTE — Telephone Encounter (Signed)
Pt is still experiencing complications for vomiting, and would like to a refill on her Zofran.

## 2020-06-01 NOTE — Telephone Encounter (Signed)
Tyson Alias, [facility] RN, called triage line and left a message stating pt has been vomiting for over 24 hours. She has been taking PO phenergan and other PO nausea meds without much help. She is asking if Dr Sharen Hones would send a rx for Phenergan IM to [facility] [place] for her to give to the pt this afternoon when she goes to the patient. Please advise at [phone]

## 2020-06-01 NOTE — Addendum Note (Signed)
Addended by: [name] on: 06/01/2020 06:06 PM   Modules accepted: Orders

## 2020-06-05 ENCOUNTER — Ambulatory Visit (INDEPENDENT_AMBULATORY_CARE_PROVIDER_SITE_OTHER): Payer: Medicare Other

## 2020-06-05 ENCOUNTER — Telehealth: Payer: Self-pay | Admitting: Gastroenterology

## 2020-06-05 ENCOUNTER — Telehealth: Payer: Self-pay

## 2020-06-05 ENCOUNTER — Other Ambulatory Visit: Payer: Self-pay

## 2020-06-05 DIAGNOSIS — J9601 Acute respiratory failure with hypoxia: Secondary | ICD-10-CM | POA: Diagnosis not present

## 2020-06-05 DIAGNOSIS — I119 Hypertensive heart disease without heart failure: Secondary | ICD-10-CM | POA: Diagnosis not present

## 2020-06-05 DIAGNOSIS — J432 Centrilobular emphysema: Secondary | ICD-10-CM | POA: Diagnosis not present

## 2020-06-05 DIAGNOSIS — I2510 Atherosclerotic heart disease of native coronary artery without angina pectoris: Secondary | ICD-10-CM | POA: Diagnosis not present

## 2020-06-05 DIAGNOSIS — U071 COVID-19: Secondary | ICD-10-CM | POA: Diagnosis not present

## 2020-06-05 DIAGNOSIS — J1282 Pneumonia due to coronavirus disease 2019: Secondary | ICD-10-CM | POA: Diagnosis not present

## 2020-06-05 DIAGNOSIS — R112 Nausea with vomiting, unspecified: Secondary | ICD-10-CM

## 2020-06-05 MED ORDER — PROMETHAZINE HCL 25 MG/ML IJ SOLN
25.0000 mg | Freq: Once | INTRAMUSCULAR | Status: AC
  Administered 2020-06-05: 25 mg via INTRAMUSCULAR

## 2020-06-05 NOTE — Telephone Encounter (Signed)
S/p nurse visit today with phenergan 25mg  IM x1.  Please call tomorrow for update on symptoms.

## 2020-06-05 NOTE — Telephone Encounter (Signed)
Patient's daughter contacted the office and states that the patient is having severe nausea, and she cannot get Phenergan injection at the pharmacy - as the pharmacy is having issues with the wholesale provider, and they do not know when they will get this in.  She is wondering if she can come in to the office to have this inj? Please advise.

## 2020-06-05 NOTE — Telephone Encounter (Signed)
06/26/20 at 130 pm appt made with Hyacinth Meeker PA to discuss continued vomiting despite zofran.  Per last result note from [facility] may be due to polypharmacy.  The pt advised that if she continues to vomit and can not keep any fluids down she should go to Urgent care.  The pt has been advised of the information and verbalized understanding.

## 2020-06-05 NOTE — Telephone Encounter (Signed)
Noted  

## 2020-06-05 NOTE — Telephone Encounter (Signed)
[facility] to Dr Reece Agar. We will bring her in and give her a shot in the office. I have put her on the Nurse schedule.

## 2020-06-05 NOTE — Telephone Encounter (Signed)
Vomiting is getting a little better but still having episodes. Having at least 1 episode of vomiting with the phenergan suppositories every 8 hours. [facility]gan injection.

## 2020-06-05 NOTE — Progress Notes (Addendum)
Per orders of Dr. Sharen Hones, injection of Phenergan 25mg /ml given by , CMA. Patient tolerated injection well.

## 2020-06-06 DIAGNOSIS — Z8614 Personal history of Methicillin resistant Staphylococcus aureus infection: Secondary | ICD-10-CM

## 2020-06-06 DIAGNOSIS — G2581 Restless legs syndrome: Secondary | ICD-10-CM

## 2020-06-06 DIAGNOSIS — Z7982 Long term (current) use of aspirin: Secondary | ICD-10-CM

## 2020-06-06 DIAGNOSIS — E039 Hypothyroidism, unspecified: Secondary | ICD-10-CM

## 2020-06-06 DIAGNOSIS — Z792 Long term (current) use of antibiotics: Secondary | ICD-10-CM

## 2020-06-06 DIAGNOSIS — K5903 Drug induced constipation: Secondary | ICD-10-CM

## 2020-06-06 DIAGNOSIS — I700 Atherosclerosis of aorta: Secondary | ICD-10-CM

## 2020-06-06 DIAGNOSIS — G4733 Obstructive sleep apnea (adult) (pediatric): Secondary | ICD-10-CM

## 2020-06-06 DIAGNOSIS — Z96643 Presence of artificial hip joint, bilateral: Secondary | ICD-10-CM

## 2020-06-06 DIAGNOSIS — J9601 Acute respiratory failure with hypoxia: Secondary | ICD-10-CM | POA: Diagnosis not present

## 2020-06-06 DIAGNOSIS — Z87891 Personal history of nicotine dependence: Secondary | ICD-10-CM

## 2020-06-06 DIAGNOSIS — E871 Hypo-osmolality and hyponatremia: Secondary | ICD-10-CM | POA: Diagnosis not present

## 2020-06-06 DIAGNOSIS — I119 Hypertensive heart disease without heart failure: Secondary | ICD-10-CM | POA: Diagnosis not present

## 2020-06-06 DIAGNOSIS — I482 Chronic atrial fibrillation, unspecified: Secondary | ICD-10-CM | POA: Diagnosis not present

## 2020-06-06 DIAGNOSIS — D649 Anemia, unspecified: Secondary | ICD-10-CM

## 2020-06-06 DIAGNOSIS — M170 Bilateral primary osteoarthritis of knee: Secondary | ICD-10-CM | POA: Diagnosis not present

## 2020-06-06 DIAGNOSIS — J45909 Unspecified asthma, uncomplicated: Secondary | ICD-10-CM | POA: Diagnosis not present

## 2020-06-06 DIAGNOSIS — Z9981 Dependence on supplemental oxygen: Secondary | ICD-10-CM

## 2020-06-06 DIAGNOSIS — Z7951 Long term (current) use of inhaled steroids: Secondary | ICD-10-CM

## 2020-06-06 DIAGNOSIS — F319 Bipolar disorder, unspecified: Secondary | ICD-10-CM

## 2020-06-06 DIAGNOSIS — Z7984 Long term (current) use of oral hypoglycemic drugs: Secondary | ICD-10-CM

## 2020-06-06 DIAGNOSIS — J1282 Pneumonia due to coronavirus disease 2019: Secondary | ICD-10-CM | POA: Diagnosis not present

## 2020-06-06 DIAGNOSIS — M47818 Spondylosis without myelopathy or radiculopathy, sacral and sacrococcygeal region: Secondary | ICD-10-CM

## 2020-06-06 DIAGNOSIS — M8580 Other specified disorders of bone density and structure, unspecified site: Secondary | ICD-10-CM

## 2020-06-06 DIAGNOSIS — Z6841 Body Mass Index (BMI) 40.0 and over, adult: Secondary | ICD-10-CM

## 2020-06-06 DIAGNOSIS — E1142 Type 2 diabetes mellitus with diabetic polyneuropathy: Secondary | ICD-10-CM | POA: Diagnosis not present

## 2020-06-06 DIAGNOSIS — J432 Centrilobular emphysema: Secondary | ICD-10-CM | POA: Diagnosis not present

## 2020-06-06 DIAGNOSIS — Z87440 Personal history of urinary (tract) infections: Secondary | ICD-10-CM

## 2020-06-06 DIAGNOSIS — E1136 Type 2 diabetes mellitus with diabetic cataract: Secondary | ICD-10-CM | POA: Diagnosis not present

## 2020-06-06 DIAGNOSIS — U071 COVID-19: Secondary | ICD-10-CM | POA: Diagnosis not present

## 2020-06-06 DIAGNOSIS — Z981 Arthrodesis status: Secondary | ICD-10-CM

## 2020-06-06 DIAGNOSIS — F418 Other specified anxiety disorders: Secondary | ICD-10-CM

## 2020-06-06 DIAGNOSIS — E785 Hyperlipidemia, unspecified: Secondary | ICD-10-CM

## 2020-06-06 DIAGNOSIS — I2510 Atherosclerotic heart disease of native coronary artery without angina pectoris: Secondary | ICD-10-CM | POA: Diagnosis not present

## 2020-06-06 DIAGNOSIS — K219 Gastro-esophageal reflux disease without esophagitis: Secondary | ICD-10-CM

## 2020-06-06 DIAGNOSIS — Z9181 History of falling: Secondary | ICD-10-CM

## 2020-06-07 ENCOUNTER — Telehealth: Payer: Self-pay | Admitting: Cardiology

## 2020-06-07 NOTE — Telephone Encounter (Signed)
Attempted to contact pt.  No answer.  Vm box full.  Need to get an update on pt.

## 2020-06-07 NOTE — Telephone Encounter (Signed)
Pt returning call.  States she did have the shot on 06/05/20.  Has not vomited since and has been able to eat a little.  However, she c/o a burning sensation in abd.

## 2020-06-07 NOTE — Telephone Encounter (Signed)
Patient's daughter is requesting to have the pressure adjusted on patient's BIPAP machine. Please return call to further discuss.

## 2020-06-08 DIAGNOSIS — J432 Centrilobular emphysema: Secondary | ICD-10-CM | POA: Diagnosis not present

## 2020-06-08 DIAGNOSIS — I119 Hypertensive heart disease without heart failure: Secondary | ICD-10-CM | POA: Diagnosis not present

## 2020-06-08 DIAGNOSIS — J9601 Acute respiratory failure with hypoxia: Secondary | ICD-10-CM | POA: Diagnosis not present

## 2020-06-08 DIAGNOSIS — U071 COVID-19: Secondary | ICD-10-CM | POA: Diagnosis not present

## 2020-06-08 DIAGNOSIS — I2510 Atherosclerotic heart disease of native coronary artery without angina pectoris: Secondary | ICD-10-CM | POA: Diagnosis not present

## 2020-06-08 DIAGNOSIS — J1282 Pneumonia due to coronavirus disease 2019: Secondary | ICD-10-CM | POA: Diagnosis not present

## 2020-06-11 DIAGNOSIS — J1282 Pneumonia due to coronavirus disease 2019: Secondary | ICD-10-CM | POA: Diagnosis not present

## 2020-06-11 DIAGNOSIS — J9601 Acute respiratory failure with hypoxia: Secondary | ICD-10-CM | POA: Diagnosis not present

## 2020-06-11 DIAGNOSIS — J432 Centrilobular emphysema: Secondary | ICD-10-CM | POA: Diagnosis not present

## 2020-06-11 DIAGNOSIS — U071 COVID-19: Secondary | ICD-10-CM | POA: Diagnosis not present

## 2020-06-11 DIAGNOSIS — I119 Hypertensive heart disease without heart failure: Secondary | ICD-10-CM | POA: Diagnosis not present

## 2020-06-11 DIAGNOSIS — I2510 Atherosclerotic heart disease of native coronary artery without angina pectoris: Secondary | ICD-10-CM | POA: Diagnosis not present

## 2020-06-12 DIAGNOSIS — J432 Centrilobular emphysema: Secondary | ICD-10-CM | POA: Diagnosis not present

## 2020-06-12 DIAGNOSIS — I2510 Atherosclerotic heart disease of native coronary artery without angina pectoris: Secondary | ICD-10-CM | POA: Diagnosis not present

## 2020-06-12 DIAGNOSIS — I119 Hypertensive heart disease without heart failure: Secondary | ICD-10-CM | POA: Diagnosis not present

## 2020-06-12 DIAGNOSIS — J1282 Pneumonia due to coronavirus disease 2019: Secondary | ICD-10-CM | POA: Diagnosis not present

## 2020-06-12 DIAGNOSIS — J9601 Acute respiratory failure with hypoxia: Secondary | ICD-10-CM | POA: Diagnosis not present

## 2020-06-12 DIAGNOSIS — U071 COVID-19: Secondary | ICD-10-CM | POA: Diagnosis not present

## 2020-06-12 NOTE — Telephone Encounter (Signed)
Pt's daughter [name] called requesting appt for sodium lab recheck... she stated that she spoke to you on 06/01/2020 and she was advised that pt would need to come back for repeat labs in 2 weeks which will bed 06/15/2020... lab appointment scheduled in the meantime... please future order lab

## 2020-06-13 DIAGNOSIS — J9601 Acute respiratory failure with hypoxia: Secondary | ICD-10-CM | POA: Diagnosis not present

## 2020-06-13 DIAGNOSIS — U071 COVID-19: Secondary | ICD-10-CM | POA: Diagnosis not present

## 2020-06-13 DIAGNOSIS — I119 Hypertensive heart disease without heart failure: Secondary | ICD-10-CM | POA: Diagnosis not present

## 2020-06-13 DIAGNOSIS — J432 Centrilobular emphysema: Secondary | ICD-10-CM | POA: Diagnosis not present

## 2020-06-13 DIAGNOSIS — J1282 Pneumonia due to coronavirus disease 2019: Secondary | ICD-10-CM | POA: Diagnosis not present

## 2020-06-13 DIAGNOSIS — I2510 Atherosclerotic heart disease of native coronary artery without angina pectoris: Secondary | ICD-10-CM | POA: Diagnosis not present

## 2020-06-13 NOTE — Telephone Encounter (Signed)
Amy Barnes is calling back due to never hearing from [place]. She states if it can't be adjusted she is going to stop wearing it and she is not wanting her to do that because she knows she needs it. Please advise.

## 2020-06-13 NOTE — Addendum Note (Signed)
Addended by: [name] on: 06/13/2020 07:20 AM   Modules accepted: Orders

## 2020-06-15 ENCOUNTER — Other Ambulatory Visit: Payer: Self-pay

## 2020-06-15 ENCOUNTER — Telehealth: Payer: Self-pay

## 2020-06-15 ENCOUNTER — Other Ambulatory Visit (INDEPENDENT_AMBULATORY_CARE_PROVIDER_SITE_OTHER): Payer: Medicare Other

## 2020-06-15 DIAGNOSIS — J9601 Acute respiratory failure with hypoxia: Secondary | ICD-10-CM | POA: Diagnosis not present

## 2020-06-15 DIAGNOSIS — J432 Centrilobular emphysema: Secondary | ICD-10-CM | POA: Diagnosis not present

## 2020-06-15 DIAGNOSIS — U071 COVID-19: Secondary | ICD-10-CM | POA: Diagnosis not present

## 2020-06-15 DIAGNOSIS — E871 Hypo-osmolality and hyponatremia: Secondary | ICD-10-CM | POA: Diagnosis not present

## 2020-06-15 DIAGNOSIS — I2510 Atherosclerotic heart disease of native coronary artery without angina pectoris: Secondary | ICD-10-CM | POA: Diagnosis not present

## 2020-06-15 DIAGNOSIS — J1282 Pneumonia due to coronavirus disease 2019: Secondary | ICD-10-CM | POA: Diagnosis not present

## 2020-06-15 DIAGNOSIS — I119 Hypertensive heart disease without heart failure: Secondary | ICD-10-CM | POA: Diagnosis not present

## 2020-06-15 LAB — BASIC METABOLIC PANEL
BUN: 19 mg/dL (ref 6–23)
CO2: 30 mEq/L (ref 19–32)
Calcium: 8.8 mg/dL (ref 8.4–10.5)
Chloride: 99 mEq/L (ref 96–112)
Creatinine, Ser: 0.83 mg/dL (ref 0.40–1.20)
GFR: 68.36 mL/min (ref >60.00)
Glucose, Bld: 165 mg/dL — ABNORMAL HIGH (ref 70–99)
Potassium: 3.3 mEq/L — ABNORMAL LOW (ref 3.5–5.1)
Sodium: 139 mEq/L (ref 135–145)

## 2020-06-15 MED ORDER — METFORMIN HCL 500 MG PO TABS
500.0000 mg | ORAL_TABLET | Freq: Two times a day (BID) | ORAL | 6 refills | Status: DC
Start: 2020-06-15 — End: 2020-06-19

## 2020-06-15 MED ORDER — GABAPENTIN 100 MG PO CAPS: 100.0000 mg | ORAL_CAPSULE | Freq: Every day | ORAL | 1 refills | Status: DC

## 2020-06-15 NOTE — Telephone Encounter (Signed)
FYI: Blood Sugar 253 today fasting this morning. FBS have been over 200 all week.   Also, C/O burning pain in the bottom of her feet and her legs. Was taking gabapentin in the past but not now. Asking what she can do for that. Please advise the pt.

## 2020-06-15 NOTE — Telephone Encounter (Signed)
Patient and daughter advised.

## 2020-06-15 NOTE — Telephone Encounter (Signed)
Let's bump metformin to 500mg  bid as long as tolerated (watch GI upset) Ok to restart gabapentin 100mg  nightly with option to increase to 200mg  after 3-4 days if not effective at lower doses. Update with effect. Sent in to 

## 2020-06-18 ENCOUNTER — Other Ambulatory Visit: Payer: Self-pay | Admitting: *Deleted

## 2020-06-18 ENCOUNTER — Telehealth (INDEPENDENT_AMBULATORY_CARE_PROVIDER_SITE_OTHER): Payer: Medicare Other | Admitting: Cardiovascular Disease

## 2020-06-18 ENCOUNTER — Encounter: Payer: Self-pay | Admitting: Cardiovascular Disease

## 2020-06-18 VITALS — BP 128/94 | HR 80 | Ht 64.0 in | Wt 352.4 lb

## 2020-06-18 DIAGNOSIS — U071 COVID-19: Secondary | ICD-10-CM

## 2020-06-18 DIAGNOSIS — I428 Other cardiomyopathies: Secondary | ICD-10-CM | POA: Diagnosis not present

## 2020-06-18 DIAGNOSIS — J1282 Pneumonia due to coronavirus disease 2019: Secondary | ICD-10-CM | POA: Diagnosis not present

## 2020-06-18 DIAGNOSIS — G4733 Obstructive sleep apnea (adult) (pediatric): Secondary | ICD-10-CM

## 2020-06-18 DIAGNOSIS — I2510 Atherosclerotic heart disease of native coronary artery without angina pectoris: Secondary | ICD-10-CM | POA: Diagnosis not present

## 2020-06-18 NOTE — Patient Outreach (Signed)
[facility]) Care Management  06/18/2020  Amy Barnes Apr 12, 1951 [id number]   Call placed to member to follow up on ongoing recovery from Covid.  Female answering phone state member is not available and not feeling well enough to talk today, having some nausea/vomiting.  Request call back once feeling better, if no call back will follow up within the next 3-4 business days.  Kemper Durie, [place], MSN [facility] Care Management  [facility] Manager [phone]

## 2020-06-18 NOTE — Progress Notes (Signed)
Virtual Visit via Video Note   This visit type was conducted due to national recommendations for restrictions regarding the COVID-19 Pandemic (e.g. social distancing) in an effort to limit this patient's exposure and mitigate transmission in our community.  Due to her co-morbid illnesses, this patient is at least at moderate risk for complications without adequate follow up.  This format is felt to be most appropriate for this patient at this time.  All issues noted in this document were discussed and addressed.  A limited physical exam was performed with this format.  Please refer to the patient's chart for her consent to telehealth for [facility]       Date:  06/18/2020   ID:  Amy Barnes, DOB [birth year], MRN [id number] The patient was identified using 2 identifiers.  Patient Location: Home Provider Location: Home Office  PCP:  [name], MD  Cardiologist:  Rollene Rotunda, MD, Nicki Guadalajara, MD (sleep) Electrophysiologist:  None   Evaluation Performed: New sleep evaluation   History of Present Illness:    Amy Barnes is a 69 y.o. female who has a history of obesity with a BMI >60, hypertension, COPD, remote sleep apnea, chronic back pain/ infection post surgery, and LBBB. She was admitted to the emergency room in February 2021 with nausea and vomiting. Her troponin was elevated. Her D-dimer was elevated. She had a CT scan that was negative for pulmonary embolism but demonstrated coronary calcification and congestive heart failure.  An echo Doppler study in February 2021 showed an EF of 35 to 40% with apical wall motion abnormality.  Definitive catheterization showed normal coronary arteries.  She was treated as a nonischemic cardiomyopathy and therapy was initiated with Entresto, furosemide and carvedilol.  Due to concerns for progressive significant sleep apnea, she was referred for diagnostic polysomnogram which was performed on January 23, 2020.  This demonstrated  moderate overall sleep apnea with an AHI of 20.6/h; however, events were very severe during REM sleep with an AHI of 59.3/h.  She had severe oxygen desaturation to a nadir of 67% and time spent less than 89% was 293 minutes.  She ultimately was referred for a in lab CPAP titration study.  This was done on April 15, 2020.  CPAP was titrated up to 18 cm and due to continued events  was transitioned to BiPAP and she required pressure titration up to 24/20.  She recently was hospitalized with COVID-19 pneumonia from August 5 through August 10 treated with remdesevir, IV steroids, oxygen therapy and had low oxygen saturations.  She had been unvaccinated.  Shortly after going home, she required rehospitalization from August 14 through August 24 due to worsening shortness of breath and respiratory failure secondary to COVID-19 pneumonitis.  Following her hospitalization she did have periods of continued nausea and vomiting.  She was recently started on BiPAP therapy but initially felt the pressures were too high.  As result last week she contacted our office and I had reduced her pressures to an EPAP minimum of 15 with an IPAP maximum of 25.  She has been using a full facemask which was recently changed from the one that she had gotten from the lab which was too small.  Over the last several days she has used BiPAP with her new settings and new mask and has noted significant benefit.  A download over the past several days has shown 100% use with average usage at 8 hours and 31 minutes.  AHI is 0.1 with a  95th percentile pressure at 19.9/15.8 and a maximum average pressure of 20.6/16.6.  She presents for an initial telemedicine evaluation.   Past Medical History:  Diagnosis Date  . Allergic rhinitis   . Ambulates with cane    straight  . Anemia   . Anxiety   . Asthma    seasonal  . Bipolar affective disorder (HCC)    takes Synthroid meds for Bipolar  . CAD (coronary artery disease) 07/2016   by CT scan    . Carpal tunnel syndrome    had surgery but occasional still has some issues per patient  . Cataract   . Centrilobular emphysema (HCC) 07/2016   by CT scan - pt not aware of this  . Constipation due to pain medication   . Depression with anxiety   . Diabetes mellitus    type 2 - no meds diet controlled  . GERD (gastroesophageal reflux disease)   . History of blood transfusion   . 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 Hyperlipidemia   . Hypertension   . Hypothyroidism   . OSA (obstructive sleep apnea)    no longer using cpap, uses a bed that raises and lowers hob  . Osteoarthritis   . Osteopenia 08/11/2019   DEXA 07/2019 - T -1.1 R femur (osteopenia)  . Pneumonia   . Restless legs   . Septic arthritis (HCC) 10/11/2012  . Shingles 06/30/2016  . Status post revision of total hip replacement bilateral   prosthetic infection R 2013, L 2015  . Thoracic aortic atherosclerosis (HCC) 11/207   by CT   Past Surgical History:  Procedure Laterality Date  . BREAST CYST ASPIRATION    . CARPAL TUNNEL RELEASE Right 05/15/2011  . CARPAL TUNNEL RELEASE Left 12/24/2017   Procedure: LEFT CARPAL TUNNEL RELEASE;  Surgeon: [name], MD;  Location: [facility];  Service: Orthopedics;  Laterality: Left;  . CERVICAL FUSION  2012   C2/3/4  . COLONOSCOPY WITH PROPOFOL N/A 12/31/2015   diverticulosis, int hem, o/w normal rpt 10 yrs (Rein)  . ESOPHAGOGASTRODUODENOSCOPY (EGD) WITH PROPOFOL N/A 11/12/2019   Procedure: ESOPHAGOGASTRODUODENOSCOPY (EGD) WITH PROPOFOL;  Surgeon: Sherrilyn Rist, MD;  Location: [facility];  Service: Gastroenterology;  Laterality: N/A;  . EYE SURGERY Bilateral    cataract surgery with lens implant  . FOOT SURGERY  1982   bone spur  . I & D EXTREMITY  09/06/2012   Budd Palmer, MD; Right;  I&D of right thigh  . I & D EXTREMITY Left 07/2013   wound vac - daily doxycycline indefinitely  . KNEE ARTHROSCOPY  09/01/2012    Dannielle Huh, MD;  Right  . LEFT HEART CATH AND CORONARY ANGIOGRAPHY N/A 11/10/2019   Procedure: LEFT HEART CATH AND CORONARY ANGIOGRAPHY;  Surgeon: Lennette Bihari, MD;  Location: [facility];  Service: Cardiovascular;  Laterality: N/A;  . LUMBAR FUSION  01/08/2012   L3-4  . LUMBAR FUSION  02/2019   unexpectedly discovered MRSA infection - pus Lovell Sheehan)   . LUMBAR LAMINECTOMY/DECOMPRESSION MICRODISCECTOMY N/A 11/13/2016   LAMINOTOMY/LAMINECTOMY LUMBAR FOUR LUMBAR FIVE  WITH RESECTION OF SYNOVIAL CYST;  Surgeon: Tressie Stalker, MD  . NECK SURGERY     Herniated disk C2,3,4  . NOSE SURGERY    . PARTIAL HYSTERECTOMY  1984   for mennorhagia, ovaries remain  . REVISION TOTAL HIP ARTHROPLASTY Left 08/28/2011  . TMJ ARTHROPLASTY  1982  . TOTAL  HIP ARTHROPLASTY Left 2002  . TRIGGER FINGER RELEASE Right 05/15/2011   long finger     Current Meds  Medication Sig  . albuterol (PROVENTIL HFA;VENTOLIN HFA) 108 (90 Base) MCG/ACT inhaler Inhale 2 puffs into the lungs every 6 (six) hours as needed for wheezing or shortness of breath.  Marland Kitchen aspirin 81 MG EC tablet Take 81 mg by mouth at bedtime.   . budesonide-formoterol (SYMBICORT) 160-4.5 MCG/ACT inhaler Inhale 2 puffs into the lungs 2 (two) times daily.  Marland Kitchen dicyclomine (BENTYL) 20 MG tablet Take 1 tablet (20 mg total) by mouth in the morning and at bedtime. (Patient taking differently: Take 20 mg by mouth in the morning and at bedtime. As needed)  . doxycycline (VIBRA-TABS) 100 MG tablet Take 1 tablet (100 mg total) by mouth 2 (two) times daily.  Marland Kitchen FLUoxetine (PROZAC) 40 MG capsule Take 40 mg by mouth daily.   . furosemide (LASIX) 40 MG tablet Take 1 tablet (40 mg total) by mouth daily.  Marland Kitchen gabapentin (NEURONTIN) 100 MG capsule Take 1-2 capsules (100-200 mg total) by mouth at bedtime.  . lamoTRIgine (LAMICTAL) 200 MG tablet Take 200 mg by mouth daily.    Marland Kitchen levothyroxine (SYNTHROID) 150 MCG tablet TAKE ONE TABLET BY MOUTH AT BEDTIME (Patient taking  differently: Take 150 mcg by mouth daily before breakfast. )  . metFORMIN (GLUCOPHAGE) 500 MG tablet Take 1 tablet (500 mg total) by mouth 2 (two) times daily with a meal.  . montelukast (SINGULAIR) 10 MG tablet Take 1 tablet (10 mg total) by mouth at bedtime.  . Multiple Vitamin (MULTIVITAMIN WITH MINERALS) TABS tablet Take 2 tablets by mouth daily.   . ondansetron (ZOFRAN ODT) 8 MG disintegrating tablet Take 1 tablet (8 mg total) by mouth 2 (two) times daily as needed for nausea or vomiting.  . Oxcarbazepine (TRILEPTAL) 300 MG tablet Take 1 tablet (300 mg total) by mouth daily.  . pantoprazole (PROTONIX) 40 MG tablet Take 40 mg by mouth 2 (two) times daily.   . polyethylene glycol (MIRALAX / GLYCOLAX) 17 g packet Take 17 g by mouth 2 (two) times daily as needed for mild constipation.  . prochlorperazine (COMPAZINE) 10 MG tablet Take 1 tablet (10 mg total) by mouth every 6 (six) hours as needed for nausea or vomiting.  . promethazine (PHENERGAN) 25 MG suppository Place 1 suppository (25 mg total) rectally every 6 (six) hours as needed for nausea or vomiting.  . simvastatin (ZOCOR) 40 MG tablet Take 1 tablet (40 mg total) by mouth every evening.     Allergies:   Toviaz [fesoterodine], Cephalexin, Hydrocodone, Entresto [sacubitril-valsartan], Macrodantin [nitrofurantoin], Tolterodine tartrate, Risperidone and related, Seroquel [quetiapine fumarate], and Sulfa antibiotics   Social History   Tobacco Use  . Smoking status: Former Smoker    Packs/day: 1.50    Years: 40.00    Pack years: 60.00    Types: Cigarettes    Quit date: 04/29/2009    Years since quitting: 11.1  . Smokeless tobacco: Never Used  Vaping Use  . Vaping Use: Never used  Substance Use Topics  . Alcohol use: Yes    Alcohol/week: 2.0 standard drinks    Types: 2 Shots of liquor per week    Comment: 2 shots/weekly  . Drug use: No     Family Hx: The patient's family history includes Alcohol abuse in her father; Aneurysm (age  of onset: 92) in her father; CAD in an other family member; Cancer in her brother and father; Diabetes in  her brother and sister. There is no history of Anesthesia problems, Hypotension, Malignant hyperthermia, Pseudochol deficiency, or Breast cancer.  ROS:   Please see the history of present illness.    She has continued to use supplemental oxygen was wondering how long this would need to be continued following her hospitalization She denied recent wheezing. Following her hospitalization she did have some issues with nausea and vomiting She had difficulty with her initial mask She has a longstanding history of morbid obesity Sleep apnea symptoms have included snoring, nocturia, nonrestorative sleep, daytime sleepiness which have improved over the last several nights.  Typically she goes to bed between 9 PM and 1 AM.  She is unaware of bruxism, sleep paralysis. All other systems reviewed and are negative.   Prior CV studies:   The following studies were reviewed today:  Patient Name: Sheika, Coutts Date: 04/15/2020 Gender: Female D.O.B: September 15, 1951 Age (years): 73 Referring Provider: Joni Reining NP Height (inches): 64 Interpreting Physician: Nicki Guadalajara MD, ABSM Weight (lbs): 375 RPSGT: Rosette Reveal BMI: 64 MRN: [id number] Neck Size: 17.00  CLINICAL INFORMATION The patient is referred for a BiPAP titration to treat sleep apnea.  Date of NPSG: 01/23/2020:  AHI 20.6/h; RDI 22.1/h; REM AHI 59.3/h; supine AHI 20.6/h; O2 nadir 67%.  SLEEP STUDY TECHNIQUE As per the AASM Manual for the Scoring of Sleep and Associated Events v2.3 (April 2016) with a hypopnea requiring 4% desaturations.  The channels recorded and monitored were frontal, central and occipital EEG, electrooculogram (EOG), submentalis EMG (chin), nasal and oral airflow, thoracic and abdominal wall motion, anterior tibialis EMG, snore microphone, electrocardiogram, and pulse oximetry. Bilevel positive airway  pressure (BPAP) was initiated at the beginning of the study and titrated to treat sleep-disordered breathing.  MEDICATIONS albuterol (PROVENTIL HFA;VENTOLIN HFA) 108 (90 Base) MCG/ACT inhaler aspirin 81 MG EC tablet budesonide-formoterol (SYMBICORT) 160-4.5 MCG/ACT inhaler carvedilol (COREG) 6.25 MG tablet(Expired) celecoxib (CELEBREX) 200 MG capsule dicyclomine (BENTYL) 20 MG tablet docusate sodium (COLACE) 100 MG capsule doxycycline (VIBRA-TABS) 100 MG tablet fluconazole (DIFLUCAN) 200 MG tablet FLUoxetine (PROZAC) 40 MG capsule furosemide (LASIX) 20 MG tablet(Expired) lamoTRIgine (LAMICTAL) 200 MG tablet levothyroxine (SYNTHROID) 150 MCG tablet losartan (COZAAR) 50 MG tablet(Expired) metFORMIN (GLUCOPHAGE) 500 MG tablet montelukast (SINGULAIR) 10 MG tablet Multiple Vitamin (MULTIVITAMIN WITH MINERALS) TABS tablet ondansetron (ZOFRAN ODT) 8 MG disintegrating tablet Oxcarbazepine (TRILEPTAL) 300 MG tablet oxybutynin (DITROPAN-XL) 10 MG 24 hr tablet pantoprazole (PROTONIX) 40 MG tablet polyethylene glycol (MIRALAX / GLYCOLAX) 17 g packet prochlorperazine (COMPAZINE) 10 MG tablet promethazine (PHENERGAN) 25 MG suppository simvastatin (ZOCOR) 40 MG tablet sucralfate (CARAFATE) 1 GM/10ML suspension zaleplon (SONATA) 10 MG capsule Medications self-administered by patient taken the night of the study : N/A  RESPIRATORY PARAMETERS Optimal IPAP Pressure (cm): 24        AHI at Optimal Pressure (/hr) 0.8 Optimal EPAP Pressure (cm):            20          Overall Minimal O2 (%):         82.0     Minimal O2 at Optimal Pressure (%): 91.0  SLEEP ARCHITECTURE Start Time:      10:21:20 PM    Stop Time:       4:39:25 AM      Total Time (min):         378.1   Total Sleep Time (min):      340 Sleep Latency (min):   13.9     Sleep Efficiency (%):  89.9%  REM Latency (min):    99.0     WASO (min):  24.2 Stage N1 (%): 2.5%    Stage N2 (%): 46.2%  Stage N3 (%): 0.0%    Stage R (%):    51.3 Supine (%):     100.00 Arousal Index (/hr):     3.0         CARDIAC DATA The 2 lead EKG demonstrated sinus rhythm. The mean heart rate was 79.6 beats per minute. Other EKG findings include: None.  LEG MOVEMENT DATA The total Periodic Limb Movements of Sleep (PLMS) were 0. The PLMS index was 0.0. A PLMS index of <15 is considered normal in adults.  IMPRESSIONS - CPAP was initiated at 5 cm and was titrated to 18 cm with transition to BiPAP titrated to optimal BiPAP pressure at 24/20 cm of water. - Central sleep apnea was not noted during this titration (CAI = 0.0/h). - Moderete oxygen desaturations to a nadir of 82.0% at 16 cm. - The patient snored with soft snoring volume. - No cardiac abnormalities were observed during this study. - Clinically significant periodic limb movements were not noted during this study. Arousals associated with PLMs were rare.  DIAGNOSIS - Obstructive Sleep Apnea (G47.33)  RECOMMENDATIONS - Recommend an initial trial of BiPAP therapy at 24/20 cm H2O with heated humidification.  A Small size Fisher&Paykel Full Face Mask Simplus mask was used for the titration. - Effort should be made to optimize nasal and oropharyngeal patency. - Avoid alcohol, sedatives and other CNS depressants that may worsen sleep apnea and disrupt normal sleep architecture. - Sleep hygiene should be reviewed to assess factors that may improve sleep quality. - Weight management and regular exercise should be initiated or continued. - Recommend a download in 30 days and sleep clinic evaluation after 4 weeks of therapy.  Labs/Other Tests and Data Reviewed:    EKG:  An ECG dated 05/12/2020 was personally reviewed today and demonstrated:  Normal sinus rhythm at 85, left atrial enlargement, left bundle branch block, and QTc interval at 488 ms  Recent Labs: 05/08/2020: Magnesium 2.2 05/13/2020: TSH 0.873 05/16/2020: B Natriuretic Peptide 139.5 05/30/2020: ALT 34; Hemoglobin 11.8;  Platelets 330.0; Pro B Natriuretic peptide (BNP) 285.0 06/15/2020: BUN 19; Creatinine, Ser 0.83; Potassium 3.3; Sodium 139   Recent Lipid Panel Lab Results  Component Value Date/Time   CHOL 115 05/05/2020 04:19 AM   CHOL 133 06/18/2015 12:00 AM   TRIG 35 05/12/2020 10:27 PM   TRIG 62 06/18/2015 12:00 AM   HDL 44 05/05/2020 04:19 AM   CHOLHDL 2.6 05/05/2020 04:19 AM   LDLCALC 58 05/05/2020 04:19 AM   LDLCALC 57 06/18/2015 12:00 AM   LDLDIRECT 48.0 03/13/2016 10:25 AM    Wt Readings from Last 3 Encounters:  06/18/20 (!) 352 lb 6.4 oz (159.8 kg)  05/30/20 (!) 350 lb 7 oz (159 kg)  05/22/20 (!) 359 lb 12.7 oz (163.2 kg)     Objective:    Vital Signs:  BP (!) 128/94   Pulse 80   Ht  (1.626 m)   Wt (!) 352 lb 6.4 oz (159.8 kg)   LMP  (LMP Unknown)   SpO2 93%   BMI 60.49 kg/m    This was a video virtual visit.  Recent blood pressures have been 131/56 and 128/72  She has a history of super morbid obesity with BMI at 60.49 She had supplemental oxygen in place Her breathing was normal and not labored Thick neck  No chest wall pain No awareness of palpitations I could not assess for edema Normal affect and mood   ASSESSMENT & PLAN:    1. Obstructive sleep apnea: I reviewed the patient's recent diagnostic polysomnogram as well as her titration study.  She was found to have moderate overall sleep apnea but sleep apnea was very severe during REM sleep at 59.6/h.  On her diagnostic study she had severe oxygen desaturation to 69%.  She required transition to BiPAP on her titration study and required high pressure initially at 24/20.  However, she had called our office and we made adjustments to her therapy.  Her download over the last 2 nights demonstrates excellent compliance averaging 8 hours and 31 minutes.  Her 95th percentile pressure was 19.9/15.8 and AHI was 0.1.  I discussed with her the importance of continuation of this clients and that she will need to be evaluated  within 90-day window.  I discussed the effects of untreated sleep apnea on cardiovascular health.  If she continues to have issues with hypoxemia she may very well have a component of obesity/hypoventilation syndrome.  Presently she is continuing to use supplemental oxygen.  It may be worthwhile to consider pulmonary evaluation in the future to see if she requires long-term oxygen supplementation.  I will evaluate her in person in 2 months. 2. Nonischemic cardiomyopathy: Patient is followed by Dr. Antoine Poche and will have a follow-up office visit with him.  A subsequent echo Doppler study in May 2021 showed normalization of LV function with EF at 60 to 65% compared to her prior February 2021 assessment.  In light of her recent COVID-19 pneumonitis a subsequent echo may be necessary. 3. Super morbid obesity: Weight loss strongly encouraged. 4. COVID-19 pneumonia: Hospitalized August 5 through August 10 with rehospitalization August 14 through August 24 with worsening shortness of breath and hypoxia secondary to COVID-19 pneumonitis  COVID-19 Education: The signs and symptoms of COVID-19 were discussed with the patient and how to seek care for testing (follow up with PCP or arrange E-visit).  The importance of social distancing was discussed today.  Time:   Today, I have spent 24 minutes with the patient with telehealth technology discussing the above problems.     Medication Adjustments/Labs and Tests Ordered: Current medicines are reviewed at length with the patient today.  Concerns regarding medicines are outlined above.   Tests Ordered: No orders of the defined types were placed in this encounter.   Medication Changes: No orders of the defined types were placed in this encounter.   Follow Up: In person, 2 months  Signed, Nicki Guadalajara, MD  06/18/2020 9:07 AM    [facility]

## 2020-06-18 NOTE — Patient Instructions (Signed)
Medication Instructions:  °CONTINUE WITH CURRENT MEDICATIONS. NO CHANGES. ° °If you need a refill on your cardiac medications before your next appointment, please call your pharmacy* ° ° °Follow-Up: °At [facility], you and your health needs are our priority.  As part of our continuing mission to provide you with exceptional heart care, we have created designated Provider Care Teams.  These Care Teams include your primary Cardiologist (physician) and Advanced Practice Providers (APPs -  Physician Assistants and Nurse Practitioners) who all work together to provide you with the care you need, when you need it. ° °We recommend signing up for the patient portal called "MyChart".  Sign up information is provided on this After Visit Summary.  MyChart is used to connect with patients for Virtual Visits (Telemedicine).  Patients are able to view lab/test results, encounter notes, upcoming appointments, etc.  Non-urgent messages can be sent to your provider as well.   °To learn more about what you can do with MyChart, go to [email or web address].   ° °Your next appointment:   °2 month(s) ° °The format for your next appointment:   °In Person ° °Provider:   °Thomas Kelly, MD ° ° °

## 2020-06-19 ENCOUNTER — Other Ambulatory Visit: Payer: Self-pay | Admitting: Family Medicine

## 2020-06-19 ENCOUNTER — Telehealth: Payer: Self-pay

## 2020-06-19 MED ORDER — METFORMIN HCL 500 MG PO TABS
500.0000 mg | ORAL_TABLET | Freq: Every day | ORAL | 6 refills | Status: DC
Start: 2020-06-19 — End: 2020-08-10

## 2020-06-19 MED ORDER — SUCRALFATE 1 G PO TABS: 1.0000 g | ORAL_TABLET | Freq: Three times a day (TID) | ORAL | 0 refills | Status: DC

## 2020-06-19 MED ORDER — POTASSIUM CHLORIDE ER 10 MEQ PO TBCR: 10.0000 meq | EXTENDED_RELEASE_TABLET | Freq: Every day | ORAL | 3 refills | Status: DC

## 2020-06-19 NOTE — Telephone Encounter (Signed)
Pt's husband, Rosanne Ashing (on dpr), lvm.  States pt has started another nausea and vomiting episode about 2.5 days ago.  Pt has be taking suggested meds.   Took Bentyl, carafate and used promethazine suppository about 8:30 last night.  Then took Carafate at 3:30 this morning and at 5:30 promethazine suppository.  Pt is vomiting clear mucous.  Has GI appt on 06/26/20.  Plz advise.  Also, request refill for Carafate.

## 2020-06-19 NOTE — Telephone Encounter (Addendum)
Recommend drop metformin back to 500mg  once daily as that's the only recent change that I know of.  Would not start oral potassium if ongoing vomiting.  Any other new meds or changes?   carafate refilled.

## 2020-06-19 NOTE — Addendum Note (Signed)
Addended by: [name] on: 06/19/2020 05:15 PM   Modules accepted: Orders

## 2020-06-20 DIAGNOSIS — I2510 Atherosclerotic heart disease of native coronary artery without angina pectoris: Secondary | ICD-10-CM | POA: Diagnosis not present

## 2020-06-20 DIAGNOSIS — J9601 Acute respiratory failure with hypoxia: Secondary | ICD-10-CM | POA: Diagnosis not present

## 2020-06-20 DIAGNOSIS — J1282 Pneumonia due to coronavirus disease 2019: Secondary | ICD-10-CM | POA: Diagnosis not present

## 2020-06-20 DIAGNOSIS — J432 Centrilobular emphysema: Secondary | ICD-10-CM | POA: Diagnosis not present

## 2020-06-20 DIAGNOSIS — I119 Hypertensive heart disease without heart failure: Secondary | ICD-10-CM | POA: Diagnosis not present

## 2020-06-20 DIAGNOSIS — U071 COVID-19: Secondary | ICD-10-CM | POA: Diagnosis not present

## 2020-06-20 MED ORDER — SUCRALFATE 1 GM/10ML PO SUSP: 1.0000 g | Freq: Three times a day (TID) | ORAL | 0 refills | Status: DC

## 2020-06-20 NOTE — Telephone Encounter (Signed)
carafate liquid refilled.

## 2020-06-20 NOTE — Addendum Note (Signed)
Addended by: [name] on: 06/20/2020 05:30 PM   Modules accepted: Orders

## 2020-06-20 NOTE — Telephone Encounter (Signed)
Spoke with pt/pt's husband and daughter, [name], relaying Dr. Timoteo Expose message.  Verbalizes understanding.  Pt states she has not had any other changes in meds or started any new meds.  She requests Carafate liquid vs tablet.  FYI to Dr. Reece Agar.

## 2020-06-21 DIAGNOSIS — U071 COVID-19: Secondary | ICD-10-CM | POA: Diagnosis not present

## 2020-06-21 DIAGNOSIS — J9601 Acute respiratory failure with hypoxia: Secondary | ICD-10-CM | POA: Diagnosis not present

## 2020-06-21 DIAGNOSIS — J1282 Pneumonia due to coronavirus disease 2019: Secondary | ICD-10-CM | POA: Diagnosis not present

## 2020-06-21 DIAGNOSIS — I119 Hypertensive heart disease without heart failure: Secondary | ICD-10-CM | POA: Diagnosis not present

## 2020-06-21 DIAGNOSIS — I2510 Atherosclerotic heart disease of native coronary artery without angina pectoris: Secondary | ICD-10-CM | POA: Diagnosis not present

## 2020-06-21 DIAGNOSIS — J432 Centrilobular emphysema: Secondary | ICD-10-CM | POA: Diagnosis not present

## 2020-06-22 ENCOUNTER — Other Ambulatory Visit: Payer: Self-pay | Admitting: *Deleted

## 2020-06-22 NOTE — Patient Outreach (Signed)
[facility]) Care Management  06/22/2020  Amy Barnes 1951/04/10 [id number]   Outreach attempt #2, successful.  Call placed to member to follow up on ongoing recovery from Covid.  State she is better but still in the process of recovery.  Still does not have her sense of taste/smell back yet, still experiencing intermittent nausea and vomiting.  State she has not vomited in the past couple days, has follow up with GI on Tuesday.  She feel the nausea/vomiting could be a result of her stopping her psych meds as well as increasing her Metformin.  Her Metformin has since been decreased again, will see psych on Tuesday as well for medication management.  Report blood pressure and glucose are controlled, today's reading 130/76 and 150.  Continue to have [facility] RN visiting weekly.  Denies any urgent concerns, encouraged to contact this care manager with questions.  Goals Addressed              This Visit's Progress     Recover from Covid (pt-stated)   On track     CARE PLAN ENTRY (see longtitudinal plan of care for additional care plan information)  Current Barriers:   Knowledge Deficits related to COVID-19 and impact on patient self health management  Clinical Goal(s):   Over the next 30 days, patient will verbalize basic understanding of COVID-19 impact on individual health and self health management as evidenced by verbalization of basic understanding of COVID-19 as a viral disease, measures to prevent exposure, signs and symptoms, when to contact provider  Interventions:  Advised patient to call PCP for follow up  Discussed plans with patient for ongoing care management follow up and provided patient with direct contact information for care management team  Provided patient with EMMI  educational materials related to Covid recovery  Patient Self Care Activities:   Attends all scheduled provider appointments  Performs ADL's independently  UPDATE 9/24 -  recovering, still having nausea/vomiting and loss of taste/smell                    Kemper Durie, RN, MSN [facility] Care Management  [facility] Care Manager [phone]

## 2020-06-23 DIAGNOSIS — U071 COVID-19: Secondary | ICD-10-CM | POA: Diagnosis not present

## 2020-06-23 DIAGNOSIS — D649 Anemia, unspecified: Secondary | ICD-10-CM | POA: Diagnosis not present

## 2020-06-23 DIAGNOSIS — I482 Chronic atrial fibrillation, unspecified: Secondary | ICD-10-CM | POA: Diagnosis not present

## 2020-06-23 DIAGNOSIS — E785 Hyperlipidemia, unspecified: Secondary | ICD-10-CM | POA: Diagnosis not present

## 2020-06-23 DIAGNOSIS — E1142 Type 2 diabetes mellitus with diabetic polyneuropathy: Secondary | ICD-10-CM | POA: Diagnosis not present

## 2020-06-23 DIAGNOSIS — I700 Atherosclerosis of aorta: Secondary | ICD-10-CM | POA: Diagnosis not present

## 2020-06-23 DIAGNOSIS — M47818 Spondylosis without myelopathy or radiculopathy, sacral and sacrococcygeal region: Secondary | ICD-10-CM | POA: Diagnosis not present

## 2020-06-23 DIAGNOSIS — J9601 Acute respiratory failure with hypoxia: Secondary | ICD-10-CM | POA: Diagnosis not present

## 2020-06-23 DIAGNOSIS — F418 Other specified anxiety disorders: Secondary | ICD-10-CM | POA: Diagnosis not present

## 2020-06-23 DIAGNOSIS — E1136 Type 2 diabetes mellitus with diabetic cataract: Secondary | ICD-10-CM | POA: Diagnosis not present

## 2020-06-23 DIAGNOSIS — M170 Bilateral primary osteoarthritis of knee: Secondary | ICD-10-CM | POA: Diagnosis not present

## 2020-06-23 DIAGNOSIS — J1282 Pneumonia due to coronavirus disease 2019: Secondary | ICD-10-CM | POA: Diagnosis not present

## 2020-06-23 DIAGNOSIS — J45909 Unspecified asthma, uncomplicated: Secondary | ICD-10-CM | POA: Diagnosis not present

## 2020-06-23 DIAGNOSIS — I2510 Atherosclerotic heart disease of native coronary artery without angina pectoris: Secondary | ICD-10-CM | POA: Diagnosis not present

## 2020-06-23 DIAGNOSIS — K5903 Drug induced constipation: Secondary | ICD-10-CM | POA: Diagnosis not present

## 2020-06-23 DIAGNOSIS — G2581 Restless legs syndrome: Secondary | ICD-10-CM | POA: Diagnosis not present

## 2020-06-23 DIAGNOSIS — J432 Centrilobular emphysema: Secondary | ICD-10-CM | POA: Diagnosis not present

## 2020-06-23 DIAGNOSIS — K219 Gastro-esophageal reflux disease without esophagitis: Secondary | ICD-10-CM | POA: Diagnosis not present

## 2020-06-23 DIAGNOSIS — G4733 Obstructive sleep apnea (adult) (pediatric): Secondary | ICD-10-CM | POA: Diagnosis not present

## 2020-06-23 DIAGNOSIS — E871 Hypo-osmolality and hyponatremia: Secondary | ICD-10-CM | POA: Diagnosis not present

## 2020-06-23 DIAGNOSIS — E039 Hypothyroidism, unspecified: Secondary | ICD-10-CM | POA: Diagnosis not present

## 2020-06-23 DIAGNOSIS — F319 Bipolar disorder, unspecified: Secondary | ICD-10-CM | POA: Diagnosis not present

## 2020-06-23 DIAGNOSIS — M8580 Other specified disorders of bone density and structure, unspecified site: Secondary | ICD-10-CM | POA: Diagnosis not present

## 2020-06-23 DIAGNOSIS — I119 Hypertensive heart disease without heart failure: Secondary | ICD-10-CM | POA: Diagnosis not present

## 2020-06-26 ENCOUNTER — Encounter: Payer: Self-pay | Admitting: Physician Assistant

## 2020-06-26 ENCOUNTER — Ambulatory Visit (INDEPENDENT_AMBULATORY_CARE_PROVIDER_SITE_OTHER): Payer: Medicare Other | Admitting: Physician Assistant

## 2020-06-26 VITALS — BP 126/80 | HR 71 | Ht 64.0 in | Wt 355.0 lb

## 2020-06-26 DIAGNOSIS — R112 Nausea with vomiting, unspecified: Secondary | ICD-10-CM

## 2020-06-26 DIAGNOSIS — I2510 Atherosclerotic heart disease of native coronary artery without angina pectoris: Secondary | ICD-10-CM

## 2020-06-26 DIAGNOSIS — R1314 Dysphagia, pharyngoesophageal phase: Secondary | ICD-10-CM

## 2020-06-26 DIAGNOSIS — F3181 Bipolar II disorder: Secondary | ICD-10-CM | POA: Diagnosis not present

## 2020-06-26 NOTE — Patient Instructions (Signed)
If you are age 68 or older, your body mass index should be between 23-30. Your Body mass index is 60.94 kg/m. If this is out of the aforementioned range listed, please consider follow up with your Primary Care Provider.  If you are age 2 or younger, your body mass index should be between 19-25. Your Body mass index is 60.94 kg/m. If this is out of the aformentioned range listed, please consider follow up with your Primary Care Provider.   Continue current medication.   You have been scheduled for a Barium Esophogram at [facility] Radiology (1st floor of the hospital) on Monday 07/09/20 at 10:30 am. Please arrive 15 minutes prior to your appointment for registration. Make certain not to have anything to eat or drink 3 hours prior to your test. If you need to reschedule for any reason, please contact radiology at [phone] to do so. __________________________________________________________________ A barium swallow is an examination that concentrates on views of the esophagus. This tends to be a double contrast exam (barium and two liquids which, when combined, create a gas to distend the wall of the oesophagus) or single contrast (non-ionic iodine based). The study is usually tailored to your symptoms so a good history is essential. Attention is paid during the study to the form, structure and configuration of the esophagus, looking for functional disorders (such as aspiration, dysphagia, achalasia, motility and reflux) EXAMINATION You may be asked to change into a gown, depending on the type of swallow being performed. A radiologist and radiographer will perform the procedure. The radiologist will advise you of the type of contrast selected for your procedure and direct you during the exam. You will be asked to stand, sit or lie in several different positions and to hold a small amount of fluid in your mouth before being asked to swallow while the imaging is performed .In some instances you may be  asked to swallow barium coated marshmallows to assess the motility of a solid food bolus. The exam can be recorded as a digital or video fluoroscopy procedure. POST PROCEDURE It will take 1-2 days for the barium to pass through your system. To facilitate this, it is important, unless otherwise directed, to increase your fluids for the next 24-48hrs and to resume your normal diet.  This test typically takes about 30 minutes to perform. _______________________________________________________________

## 2020-06-26 NOTE — Progress Notes (Signed)
Chief Complaint: Vomiting  HPI:    Amy Barnes is a 69 year old female with a past medical history as listed below, known to Dr. Janne Napoleon, who returns to clinic today for follow-up of vomiting.     11/21/2019 patient saw Doug Sou for vomiting.  At that time was tolerating liquids but not able to tolerate anything in the way of food.  Recent CT negative x2, EGD and gastric emptying study negative.  Similar symptoms in the past with a colonoscopy in April 2017 which was negative.  HIDA with CCK was ordered.  Her Compazine prescription was refilled and she was told to continue alternating her antiemetics.  Her Bentyl was refilled to take 20 mg twice daily regularly.  Dr. Myrtie Neither commented that she had a normal upper endoscopy, gastric emptying study and unrevealing CT abdomen pelvis and it did not seem typical for biliary dyskinesia.  Was thought possibly this is a side effect from 1 or more of her medicines considering her polypharmacy.  Was discussed she should stop the oxybutynin if they felt the timing of symptoms was related to that.  Colonoscopy is not recommended.  Symptomatic treatment was discussed.    12/05/2019 HIDA scan was normal.  At that time she was told it was likely polypharmacy.  She had no symptoms.    Today, the patient presents to clinic accompanied by her daughter who does assist with history.  She describes that she was diagnosed with Covid in mid August and was in the hospital for 1.5 weeks.  During that time she was found to have a low sodium and her Trileptal was discontinued completely, previously she had been on 900 mg daily for her bipolar disorder.  Within a week the patient developed shakes, nausea and vomiting just like she had back in February.  Phenergan helped but she went a week or more of vomiting every day not being able to keep anything down.  They followed up with her PCP who gave her back one of her Trileptal pills on 9/3 and the vomiting decreased to about every few days  and she was able to eat.  They went and saw her psychiatrist today who increased her Trileptal to twice a day with plans to recheck sodium and hopes that this would help her vomiting.  Currently patient tells me that she will vomit once every 2 or 3 days and has to use her Zofran/other antiemetics, occasionally if the oral pills do not work they use a Phenergan suppository which usually helps to stop it before it gets too bad.  On one occasion she has had to get a shot of Phenergan.  Tells me now that when she is eating it feels like she has trouble swallowing feeling like things get stuck on the way down.  She is using her Carafate 4 times daily but has not been using her Bentyl as she has not experienced any abdominal pain.  Overall the patient is happy with the progress that is being made.    Denies fever, chills, continued weight loss or change in bowel habits.  Past Medical History:  Diagnosis Date  . Allergic rhinitis   . Ambulates with cane    straight  . Anemia   . Anxiety   . Asthma    seasonal  . Bipolar affective disorder (HCC)    takes Synthroid meds for Bipolar  . CAD (coronary artery disease) 07/2016   by CT scan  . Carpal tunnel syndrome    had  surgery but occasional still has some issues per patient  . Cataract   . Centrilobular emphysema (HCC) 07/2016   by CT scan - pt not aware of this  . Constipation due to pain medication   . Depression with anxiety   . Diabetes mellitus    type 2 - no meds diet controlled  . GERD (gastroesophageal reflux disease)   . History of blood transfusion   . 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 Hyperlipidemia   . Hypertension   . Hypothyroidism   . OSA (obstructive sleep apnea)    no longer using cpap, uses a bed that raises and lowers hob  . Osteoarthritis   . Osteopenia 08/11/2019   DEXA 07/2019 - T -1.1 R femur (osteopenia)  . Pneumonia   . Restless legs   . Septic arthritis (HCC) 10/11/2012  .  Shingles 06/30/2016  . Status post revision of total hip replacement bilateral   prosthetic infection R 2013, L 2015  . Thoracic aortic atherosclerosis (HCC) 11/207   by CT    Past Surgical History:  Procedure Laterality Date  . BREAST CYST ASPIRATION    . CARPAL TUNNEL RELEASE Right 05/15/2011  . CARPAL TUNNEL RELEASE Left 12/24/2017   Procedure: LEFT CARPAL TUNNEL RELEASE;  Surgeon: [name], MD;  Location: [facility];  Service: Orthopedics;  Laterality: Left;  . CERVICAL FUSION  2012   C2/3/4  . COLONOSCOPY WITH PROPOFOL N/A 12/31/2015   diverticulosis, int hem, o/w normal rpt 10 yrs (Rein)  . ESOPHAGOGASTRODUODENOSCOPY (EGD) WITH PROPOFOL N/A 11/12/2019   Procedure: ESOPHAGOGASTRODUODENOSCOPY (EGD) WITH PROPOFOL;  Surgeon: Sherrilyn Rist, MD;  Location[facility];  Service: Gastroenterology;  Laterality: N/A;  . EYE SURGERY Bilateral    cataract surgery with lens implant  . FOOT SURGERY  1982   bone spur  . I & D EXTREMITY  09/06/2012   Budd Palmer, MD; Right;  I&D of right thigh  . I & D EXTREMITY Left 07/2013   wound vac - daily doxycycline indefinitely  . KNEE ARTHROSCOPY  09/01/2012   Dannielle Huh, MD;  Right  . LEFT HEART CATH AND CORONARY ANGIOGRAPHY N/A 11/10/2019   Procedure: LEFT HEART CATH AND CORONARY ANGIOGRAPHY;  Surgeon: Lennette Bihari, MD;  Location: [facility] INVASIVE CV LAB;  Service: Cardiovascular;  Laterality: N/A;  . LUMBAR FUSION  01/08/2012   L3-4  . LUMBAR FUSION  02/2019   unexpectedly discovered MRSA infection - pus Lovell Sheehan)   . LUMBAR LAMINECTOMY/DECOMPRESSION MICRODISCECTOMY N/A 11/13/2016   LAMINOTOMY/LAMINECTOMY LUMBAR FOUR LUMBAR FIVE  WITH RESECTION OF SYNOVIAL CYST;  Surgeon: Tressie Stalker, MD  . NECK SURGERY     Herniated disk C2,3,4  . NOSE SURGERY    . PARTIAL HYSTERECTOMY  1984   for mennorhagia, ovaries remain  . REVISION TOTAL HIP ARTHROPLASTY Left 08/28/2011  . TMJ ARTHROPLASTY  1982  . TOTAL HIP ARTHROPLASTY  Left 2002  . TRIGGER FINGER RELEASE Right 05/15/2011   long finger    Current Outpatient Medications  Medication Sig Dispense Refill  . albuterol (PROVENTIL HFA;VENTOLIN HFA) 108 (90 Base) MCG/ACT inhaler Inhale 2 puffs into the lungs every 6 (six) hours as needed for wheezing or shortness of breath. 1 Inhaler 6  . amoxicillin (AMOXIL) 875 MG tablet Take 1 tablet (875 mg total) by mouth 2 (two) times daily. (Patient not taking: Reported on 05/30/2020) 14 tablet 0  . ascorbic acid (VITAMIN C)  500 MG tablet Take 1 tablet (500 mg total) by mouth daily. (Patient not taking: Reported on 06/18/2020) 30 tablet 0  . aspirin 81 MG EC tablet Take 81 mg by mouth at bedtime.     . budesonide-formoterol (SYMBICORT) 160-4.5 MCG/ACT inhaler Inhale 2 puffs into the lungs 2 (two) times daily. 1 Inhaler 3  . carvedilol (COREG) 6.25 MG tablet Take 1 tablet (6.25 mg total) by mouth 2 (two) times daily. 60 tablet 5  . dicyclomine (BENTYL) 20 MG tablet Take 1 tablet (20 mg total) by mouth in the morning and at bedtime. (Patient taking differently: Take 20 mg by mouth in the morning and at bedtime. As needed) 60 tablet 2  . doxycycline (VIBRA-TABS) 100 MG tablet Take 1 tablet (100 mg total) by mouth 2 (two) times daily. 180 tablet 3  . FLUoxetine (PROZAC) 40 MG capsule Take 40 mg by mouth daily.     . furosemide (LASIX) 40 MG tablet Take 1 tablet (40 mg total) by mouth daily. 30 tablet 0  . gabapentin (NEURONTIN) 100 MG capsule Take 1-2 capsules (100-200 mg total) by mouth at bedtime. 60 capsule 1  . lamoTRIgine (LAMICTAL) 200 MG tablet Take 200 mg by mouth daily.      Marland Kitchen levothyroxine (SYNTHROID) 150 MCG tablet TAKE ONE TABLET BY MOUTH AT BEDTIME (Patient taking differently: Take 150 mcg by mouth daily before breakfast. ) 90 tablet 3  . metFORMIN (GLUCOPHAGE) 500 MG tablet Take 1 tablet (500 mg total) by mouth daily with breakfast. 30 tablet 6  . montelukast (SINGULAIR) 10 MG tablet Take 1 tablet (10 mg total) by mouth  at bedtime. 90 tablet 3  . Multiple Vitamin (MULTIVITAMIN WITH MINERALS) TABS tablet Take 2 tablets by mouth daily.     . ondansetron (ZOFRAN ODT) 8 MG disintegrating tablet Take 1 tablet (8 mg total) by mouth 2 (two) times daily as needed for nausea or vomiting. 10 tablet 0  . Oxcarbazepine (TRILEPTAL) 300 MG tablet Take 1 tablet (300 mg total) by mouth daily.    . pantoprazole (PROTONIX) 40 MG tablet Take 40 mg by mouth 2 (two) times daily.     . polyethylene glycol (MIRALAX / GLYCOLAX) 17 g packet Take 17 g by mouth 2 (two) times daily as needed for mild constipation. 14 each 0  . potassium chloride (KLOR-CON) 10 MEQ tablet Take 1 tablet (10 mEq total) by mouth daily. With lasix 30 tablet 3  . prochlorperazine (COMPAZINE) 10 MG tablet Take 1 tablet (10 mg total) by mouth every 6 (six) hours as needed for nausea or vomiting. 30 tablet 1  . promethazine (PHENERGAN) 25 MG suppository Place 1 suppository (25 mg total) rectally every 6 (six) hours as needed for nausea or vomiting. 12 each 0  . promethazine (PHENERGAN) 25 MG/ML injection Inject 1 mL (25 mg total) into the muscle once for 1 dose. 1 mL 0  . simvastatin (ZOCOR) 40 MG tablet Take 1 tablet (40 mg total) by mouth every evening. 90 tablet 1  . sucralfate (CARAFATE) 1 GM/10ML suspension Take 10 mLs (1 g total) by mouth 4 (four) times daily -  with meals and at bedtime. 420 mL 0  . zaleplon (SONATA) 10 MG capsule Take 10 mg by mouth at bedtime as needed for sleep. (Patient not taking: Reported on 06/18/2020)    . zinc sulfate 220 (50 Zn) MG capsule Take 1 capsule (220 mg total) by mouth daily. (Patient not taking: Reported on 06/18/2020) 30 capsule 0  No current facility-administered medications for this visit.    Allergies as of 06/26/2020 - Review Complete 06/26/2020  Allergen Reaction Noted  . Gala Murdoch [fesoterodine] Nausea And Vomiting 01/27/2020  . Cephalexin Hives 12/29/2011  . Hydrocodone Other (See Comments) 09/02/2012  . Entresto  [sacubitril-valsartan]  02/23/2020  . Macrodantin [nitrofurantoin]  05/28/2020  . Tolterodine tartrate Nausea And Vomiting 10/31/2019  . Risperidone and related Other (See Comments) 09/02/2012  . Seroquel [quetiapine fumarate] Other (See Comments) 09/02/2012  . Sulfa antibiotics Rash 11/27/2015    Family History  Problem Relation Age of Onset  . Aneurysm Father 62       brain  . Alcohol abuse Father   . Cancer Father        possibly  . CAD Other        several siblings  . Cancer Brother        prostate  . Diabetes Brother   . Diabetes Sister   . Anesthesia problems Neg Hx   . Hypotension Neg Hx   . Malignant hyperthermia Neg Hx   . Pseudochol deficiency Neg Hx   . Breast cancer Neg Hx     Social History   Socioeconomic History  . Marital status: Married    Spouse name: Not on file  . Number of children: 3  . Years of education: Not on file  . Highest education level: Not on file  Occupational History  . Occupation: Designer, industrial/product: UNEMPLOYED  Tobacco Use  . Smoking status: Former Smoker    Packs/day: 1.50    Years: 40.00    Pack years: 60.00    Types: Cigarettes    Quit date: 04/29/2009    Years since quitting: 11.1  . Smokeless tobacco: Never Used  Vaping Use  . Vaping Use: Never used  Substance and Sexual Activity  . Alcohol use: Yes    Alcohol/week: 2.0 standard drinks    Types: 2 Shots of liquor per week    Comment: 2 shots/weekly  . Drug use: No  . Sexual activity: Not Currently    Comment: Hysterectomy  Other Topics Concern  . Not on file  Social History Narrative   Married   3 children   Social Determinants of Health   Financial Resource Strain: Low Risk   . Difficulty of Paying Living Expenses: Not hard at all  Food Insecurity: No Food Insecurity  . Worried About Programme researcher, broadcasting/film/video in the Last Year: Never true  . Ran Out of Food in the Last Year: Never true  Transportation Needs: No Transportation Needs  . Lack of Transportation  (Medical): No  . Lack of Transportation (Non-Medical): No  Physical Activity: Inactive  . Days of Exercise per Week: 0 days  . Minutes of Exercise per Session: 0 min  Stress: Stress Concern Present  . Feeling of Stress : To some extent  Social Connections:   . Frequency of Communication with Friends and Family: Not on file  . Frequency of Social Gatherings with Friends and Family: Not on file  . Attends Religious Services: Not on file  . Active Member of Clubs or Organizations: Not on file  . Attends Banker Meetings: Not on file  . Marital Status: Not on file  Intimate Partner Violence: Not At Risk  . Fear of Current or Ex-Partner: No  . Emotionally Abused: No  . Physically Abused: No  . Sexually Abused: No    Review of Systems:    Constitutional: No  weight loss, fever or chills Cardiovascular: No chest pain Respiratory: No SOB  Gastrointestinal: See HPI and otherwise negative   Physical Exam:  Vital signs: BP 126/80 (BP Location: Left Wrist, Patient Position: Sitting, Cuff Size: Large)   Pulse 71   Ht 5\' 4"  (1.626 m)   Wt (!) 355 lb (161 kg)   LMP  (LMP Unknown)   SpO2 98%   BMI 60.94 kg/m   Constitutional:   Pleasant morbidly obese Caucasian female appears to be in NAD, Well developed, Well nourished, alert and cooperative Respiratory: Respirations even and unlabored. Lungs clear to auscultation bilaterally.   No wheezes, crackles, or rhonchi.  Cardiovascular: Normal S1, S2. No MRG. Regular rate and rhythm. No peripheral edema, cyanosis or pallor.  Gastrointestinal:  Soft, nondistended,mild right sided ttp. No rebound or guarding. Normal bowel sounds. No appreciable masses or hepatomegaly. Rectal:  Not performed.  Msk:  Symmetrical without gross deformities. Without edema, no deformity or joint abnormality. Wheelchair bound Psychiatric: Demonstrates good judgement and reason without abnormal affect or behaviors.  RELEVANT LABS AND IMAGING: CBC      Component Value Date/Time   WBC 8.3 05/30/2020 1028   RBC 4.29 05/30/2020 1028   HGB 11.8 (L) 05/30/2020 1028   HGB 11.8 (L) 09/25/2012 1729   HCT 36.6 05/30/2020 1028   HCT 35.9 09/25/2012 1729   PLT 330.0 05/30/2020 1028   PLT 404 09/25/2012 1729   MCV 85.3 05/30/2020 1028   MCV 82 09/25/2012 1729   MCH 27.8 05/18/2020 0159   MCHC 32.3 05/30/2020 1028   RDW 17.1 (H) 05/30/2020 1028   RDW 16.8 (H) 09/25/2012 1729   LYMPHSABS 0.9 05/30/2020 1028   MONOABS 1.0 05/30/2020 1028   EOSABS 0.0 05/30/2020 1028   BASOSABS 0.1 05/30/2020 1028    CMP     Component Value Date/Time   NA 139 06/15/2020 0757   NA 131 (L) 09/14/2018 1138   NA 145 06/18/2015 0000   K 3.3 (L) 06/15/2020 0757   K 4.9 06/18/2015 0000   CL 99 06/15/2020 0757   CL 104 09/25/2012 1729   CO2 30 06/15/2020 0757   CO2 22 09/25/2012 1729   GLUCOSE 165 (H) 06/15/2020 0757   GLUCOSE 167 (H) 09/25/2012 1729   BUN 19 06/15/2020 0757   BUN 16 09/14/2018 1138   BUN 8 09/25/2012 1729   CREATININE 0.83 06/15/2020 0757   CREATININE 0.97 08/04/2019 1614   CALCIUM 8.8 06/15/2020 0757   CALCIUM 9.5 09/25/2012 1729   PROT 6.3 05/30/2020 1028   PROT 7.0 09/14/2018 1138   PROT 9.1 (H) 09/25/2012 1729   ALBUMIN 4.1 05/30/2020 1028   ALBUMIN 4.7 09/14/2018 1138   ALBUMIN 3.3 (L) 09/25/2012 1729   AST 21 05/30/2020 1028   AST 19 06/18/2015 0000   ALT 34 05/30/2020 1028   ALT 21 06/18/2015 0000   ALKPHOS 67 05/30/2020 1028   ALKPHOS 78 06/18/2015 0000   BILITOT 0.6 05/30/2020 1028   BILITOT 0.4 09/14/2018 1138   BILITOT 0.3 06/18/2015 0000   GFRNONAA >60 05/22/2020 1010   GFRNONAA 60 08/04/2019 1614   GFRAA >60 05/22/2020 1010   GFRAA 70 08/04/2019 1614    Assessment: 1.  Nausea and vomiting: Some better now every 3 days or so, helped by antiemetics, full work-up in the past with EGD, gastric emptying study, multiple CTs, ultrasound and HIDA scan with CCK which were all negative/normal cause was thought to be  polypharmacy 2.  Dysphagia: Feels like food is  getting stuck in her throat when eating; consider esophagitis versus dysmotility versus stricture versus other  Plan: 1.  Scheduled patient for a swallow study with tablet for further evaluation of dysphagia. 2.  Recommend that they continue current medications including Carafate and antiemetics as needed. 3.  The hope would be that as her medications are appropriately adjusted that her symptoms will decrease.  Again discussed polypharmacy, we may not be able to completely get rid of her nausea and vomiting but hopefully we can get it to a point where it is tolerable. 4.  Patient to follow in clinic per recommendations after swallow study above.  Amy Meeker, PA-C [facility] 06/26/2020, 1:38 PM  Cc: [name], MD

## 2020-06-27 DIAGNOSIS — J1282 Pneumonia due to coronavirus disease 2019: Secondary | ICD-10-CM | POA: Diagnosis not present

## 2020-06-27 DIAGNOSIS — U071 COVID-19: Secondary | ICD-10-CM | POA: Diagnosis not present

## 2020-06-27 DIAGNOSIS — I2510 Atherosclerotic heart disease of native coronary artery without angina pectoris: Secondary | ICD-10-CM | POA: Diagnosis not present

## 2020-06-27 DIAGNOSIS — J432 Centrilobular emphysema: Secondary | ICD-10-CM | POA: Diagnosis not present

## 2020-06-27 DIAGNOSIS — J9601 Acute respiratory failure with hypoxia: Secondary | ICD-10-CM | POA: Diagnosis not present

## 2020-06-27 DIAGNOSIS — I119 Hypertensive heart disease without heart failure: Secondary | ICD-10-CM | POA: Diagnosis not present

## 2020-06-27 NOTE — Progress Notes (Signed)
____________________________________________________________  Attending physician addendum:  Thank you for sending this case to me. I have reviewed the entire note, and the outlined plan seems appropriate.  This patient has a non-specific upper GI motility problem with symptomatic treatment recommended.    Amada Jupiter, MD  ____________________________________________________________

## 2020-06-28 ENCOUNTER — Other Ambulatory Visit: Payer: Self-pay | Admitting: Family Medicine

## 2020-06-28 DIAGNOSIS — U071 COVID-19: Secondary | ICD-10-CM | POA: Diagnosis not present

## 2020-06-28 DIAGNOSIS — J9601 Acute respiratory failure with hypoxia: Secondary | ICD-10-CM | POA: Diagnosis not present

## 2020-06-28 DIAGNOSIS — J1282 Pneumonia due to coronavirus disease 2019: Secondary | ICD-10-CM | POA: Diagnosis not present

## 2020-06-28 DIAGNOSIS — E871 Hypo-osmolality and hyponatremia: Secondary | ICD-10-CM

## 2020-06-28 DIAGNOSIS — I2510 Atherosclerotic heart disease of native coronary artery without angina pectoris: Secondary | ICD-10-CM | POA: Diagnosis not present

## 2020-06-28 DIAGNOSIS — I119 Hypertensive heart disease without heart failure: Secondary | ICD-10-CM | POA: Diagnosis not present

## 2020-06-28 DIAGNOSIS — J432 Centrilobular emphysema: Secondary | ICD-10-CM | POA: Diagnosis not present

## 2020-07-04 DIAGNOSIS — J9601 Acute respiratory failure with hypoxia: Secondary | ICD-10-CM | POA: Diagnosis not present

## 2020-07-04 DIAGNOSIS — J1282 Pneumonia due to coronavirus disease 2019: Secondary | ICD-10-CM | POA: Diagnosis not present

## 2020-07-04 DIAGNOSIS — I119 Hypertensive heart disease without heart failure: Secondary | ICD-10-CM | POA: Diagnosis not present

## 2020-07-04 DIAGNOSIS — U071 COVID-19: Secondary | ICD-10-CM | POA: Diagnosis not present

## 2020-07-04 DIAGNOSIS — I2510 Atherosclerotic heart disease of native coronary artery without angina pectoris: Secondary | ICD-10-CM | POA: Diagnosis not present

## 2020-07-04 DIAGNOSIS — J432 Centrilobular emphysema: Secondary | ICD-10-CM | POA: Diagnosis not present

## 2020-07-04 NOTE — Progress Notes (Signed)
Cardiology Office Note   Date:  07/05/2020   ID:  Amy Barnes, DOB [birth year], MRN [id number]  PCP:  [name], MD  Cardiologist:   Rollene Rotunda, MD   Chief Complaint  Patient presents with   Leg Swelling      History of Present Illness: Amy Barnes is a 69 y.o. female who seen in the emergency room in February 2021 with nausea and vomiting.  Her troponin was elevated.  Her D-dimer was elevated.  She had a CT scan that was negative for pulmonary embolism but did show coronary calcification and congestive heart failure. Work up during her hospitalization in showed a new CM with an EF of 35-40% and apical WMA by echo on 11/09/2019.  Previous echo 4 days earlier on 11/05/2019 showed an EF of 60-65%.  Diagnostic cath done 11/10/2019 showed normal coronaries.  She has been treated as NICM with Entresto, lasix, and Coreg.  Since she was last seen he was in the hospital with pulmonary.  I reviewed these records.  She had hyponatremia.  This was thought to be secondary to hyponatremia.  Her losartan and hydralazine were held possibly as exacerbating the hyponatremia.  She does get lower extremity swelling.  She knows this by the fact that her joints hurt.  She will take extra diuretic at 20 mg Lasix and that happens.  She is having to do that a couple of times per week.  She is chronically on oxygen.  She denies any PND or orthopnea.  She is not had any new chest pressure, neck or arm discomfort.  She had stopped taking Entresto because of some itching.  Past Medical History:  Diagnosis Date   Allergic rhinitis    Ambulates with cane    straight   Anemia    Anxiety    Asthma    seasonal   Bipolar affective disorder (HCC)    takes Synthroid meds for Bipolar   CAD (coronary artery disease) 07/2016   by CT scan   Carpal tunnel syndrome    had surgery but occasional still has some issues per patient   Cataract    Centrilobular emphysema (HCC) 07/2016   by CT scan  - pt not aware of this   Constipation due to pain medication    Depression with anxiety    Diabetes mellitus    type 2 - no meds diet controlled   GERD (gastroesophageal reflux disease)    History of blood transfusion    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 - 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 PO   Hyperlipidemia    Hypertension    Hypothyroidism    OSA (obstructive sleep apnea)    no longer using cpap, uses a bed that raises and lowers hob   Osteoarthritis    Osteopenia 08/11/2019   DEXA 07/2019 - T -1.1 R femur (osteopenia)   Pneumonia    Restless legs    Septic arthritis (HCC) 10/11/2012   Shingles 06/30/2016   Status post revision of total hip replacement bilateral   prosthetic infection R 2013, L 2015   Thoracic aortic atherosclerosis (HCC) 11/207   by CT    Past Surgical History:  Procedure Laterality Date   BREAST CYST ASPIRATION     CARPAL TUNNEL RELEASE Right 05/15/2011   CARPAL TUNNEL RELEASE Left 12/24/2017   Procedure: LEFT CARPAL TUNNEL RELEASE;  Surgeon: [name], MD;  Location: [facility];  Service: Orthopedics;  Laterality:  Left;   CERVICAL FUSION  2012   C2/3/4   COLONOSCOPY WITH PROPOFOL N/A 12/31/2015   diverticulosis, int hem, o/w normal rpt 10 yrs (Rein)   ESOPHAGOGASTRODUODENOSCOPY (EGD) WITH PROPOFOL N/A 11/12/2019   Procedure: ESOPHAGOGASTRODUODENOSCOPY (EGD) WITH PROPOFOL;  Surgeon: Sherrilyn Rist, MD;  Location: [facility] ENDOSCOPY;  Service: Gastroenterology;  Laterality: N/A;   EYE SURGERY Bilateral    cataract surgery with lens implant   FOOT SURGERY  1982   bone spur   I & D EXTREMITY  09/06/2012   Budd Palmer, MD; Right;  I&D of right thigh   I & D EXTREMITY Left 07/2013   wound vac - daily doxycycline indefinitely   KNEE ARTHROSCOPY  09/01/2012   Dannielle Huh, MD;  Right   LEFT HEART CATH AND CORONARY ANGIOGRAPHY N/A 11/10/2019   Procedure: LEFT HEART CATH AND CORONARY ANGIOGRAPHY;  Surgeon: Lennette Bihari, MD;  Location: [facility];  Service: Cardiovascular;  Laterality: N/A;   LUMBAR FUSION  01/08/2012   L3-4   LUMBAR FUSION  02/2019   unexpectedly discovered MRSA infection - pus Lovell Sheehan)    LUMBAR LAMINECTOMY/DECOMPRESSION MICRODISCECTOMY N/A 11/13/2016   LAMINOTOMY/LAMINECTOMY LUMBAR FOUR LUMBAR FIVE  WITH RESECTION OF SYNOVIAL CYST;  Surgeon: Tressie Stalker, MD   NECK SURGERY     Herniated disk C2,3,4   NOSE SURGERY     PARTIAL HYSTERECTOMY  1984   for mennorhagia, ovaries remain   REVISION TOTAL HIP ARTHROPLASTY Left 08/28/2011   TMJ ARTHROPLASTY  1982   TOTAL HIP ARTHROPLASTY Left 2002   TRIGGER FINGER RELEASE Right 05/15/2011   long finger     Current Outpatient Medications  Medication Sig Dispense Refill   albuterol (PROVENTIL HFA;VENTOLIN HFA) 108 (90 Base) MCG/ACT inhaler Inhale 2 puffs into the lungs every 6 (six) hours as needed for wheezing or shortness of breath. 1 Inhaler 6   aspirin 81 MG EC tablet Take 81 mg by mouth at bedtime.      budesonide-formoterol (SYMBICORT) 160-4.5 MCG/ACT inhaler Inhale 2 puffs into the lungs 2 (two) times daily. 1 Inhaler 3   carvedilol (COREG) 6.25 MG tablet Take 1 tablet (6.25 mg total) by mouth 2 (two) times daily. 180 tablet 3   dicyclomine (BENTYL) 20 MG tablet Take 1 tablet (20 mg total) by mouth in the morning and at bedtime. (Patient taking differently: Take 20 mg by mouth in the morning and at bedtime. As needed) 60 tablet 2   doxycycline (VIBRA-TABS) 100 MG tablet Take 1 tablet (100 mg total) by mouth 2 (two) times daily. 180 tablet 3   FLUoxetine (PROZAC) 40 MG capsule Take 40 mg by mouth daily.      furosemide (LASIX) 40 MG tablet Take 1 tablet (40 mg total) by mouth daily. 30 tablet 0   gabapentin (NEURONTIN) 100 MG capsule Take 1-2 capsules (100-200 mg total) by mouth at bedtime. 60 capsule 1   lamoTRIgine (LAMICTAL) 200 MG tablet Take 200 mg by mouth daily.       levothyroxine  (SYNTHROID) 150 MCG tablet TAKE ONE TABLET BY MOUTH AT BEDTIME (Patient taking differently: Take 150 mcg by mouth daily before breakfast. ) 90 tablet 3   metFORMIN (GLUCOPHAGE) 500 MG tablet Take 1 tablet (500 mg total) by mouth daily with breakfast. 30 tablet 6   montelukast (SINGULAIR) 10 MG tablet Take 1 tablet (10 mg total) by mouth at bedtime. 90 tablet 3   Multiple Vitamin (MULTIVITAMIN WITH MINERALS) TABS tablet Take 2  tablets by mouth daily.      ondansetron (ZOFRAN ODT) 8 MG disintegrating tablet Take 1 tablet (8 mg total) by mouth 2 (two) times daily as needed for nausea or vomiting. 10 tablet 0   Oxcarbazepine (TRILEPTAL) 300 MG tablet Take 300 mg by mouth 2 (two) times daily.      pantoprazole (PROTONIX) 40 MG tablet Take 40 mg by mouth 2 (two) times daily.      polyethylene glycol (MIRALAX / GLYCOLAX) 17 g packet Take 17 g by mouth 2 (two) times daily as needed for mild constipation. 14 each 0   potassium chloride (KLOR-CON) 10 MEQ tablet Take 1 tablet (10 mEq total) by mouth daily. With lasix 30 tablet 3   prochlorperazine (COMPAZINE) 10 MG tablet Take 1 tablet (10 mg total) by mouth every 6 (six) hours as needed for nausea or vomiting. 30 tablet 1   promethazine (PHENERGAN) 25 MG suppository Place 1 suppository (25 mg total) rectally every 6 (six) hours as needed for nausea or vomiting. 12 each 0   promethazine (PHENERGAN) 25 MG/ML injection Inject 1 mL (25 mg total) into the muscle once for 1 dose. 1 mL 0   simvastatin (ZOCOR) 40 MG tablet Take 1 tablet (40 mg total) by mouth every evening. 90 tablet 1   sucralfate (CARAFATE) 1 GM/10ML suspension Take 10 mLs (1 g total) by mouth 4 (four) times daily -  with meals and at bedtime. 420 mL 0   zaleplon (SONATA) 10 MG capsule Take 10 mg by mouth at bedtime as needed for sleep.      No current facility-administered medications for this visit.    Allergies:   Toviaz [fesoterodine], Cephalexin, Hydrocodone, Entresto  [sacubitril-valsartan], Macrodantin [nitrofurantoin], Tolterodine tartrate, Risperidone and related, Seroquel [quetiapine fumarate], and Sulfa antibiotics    ROS:  Please see the history of present illness.   Otherwise, review of systems are positive for none.   All other systems are reviewed and negative.    PHYSICAL EXAM: VS:  BP (!) 139/51    Pulse 71    Ht 5\' 4"  (1.626 m)    Wt (!) 370 lb 3.2 oz (167.9 kg)    LMP  (LMP Unknown)    SpO2 99%    BMI 63.54 kg/m  , BMI Body mass index is 63.54 kg/m. GEN:  No distress NECK:  No jugular venous distention at 90 degrees, waveform within normal limits, carotid upstroke brisk and symmetric, no bruits, no thyromegaly LYMPHATICS:  No cervical adenopathy LUNGS:  Clear to auscultation bilaterally BACK:  No CVA tenderness CHEST:  Unremarkable HEART:  S1 and S2 within normal limits, no S3, no S4, no clicks, no rubs, no murmurs ABD:  Positive bowel sounds normal in frequency in pitch, no bruits, no rebound, no guarding, unable to assess midline mass or bruit with the patient seated. EXT:  2 plus pulses throughout, mild edema, no cyanosis no clubbing SKIN:  No rashes no nodules NEURO:  Cranial nerves II through XII grossly intact, motor grossly intact throughout PSYCH:  Cognitively intact, oriented to person place and time    EKG:  EKG is not ordered today.   Recent Labs: 05/08/2020: Magnesium 2.2 05/13/2020: TSH 0.873 05/16/2020: B Natriuretic Peptide 139.5 05/30/2020: ALT 34; Hemoglobin 11.8; Platelets 330.0; Pro B Natriuretic peptide (BNP) 285.0 06/15/2020: BUN 19; Creatinine, Ser 0.83; Potassium 3.3; Sodium 139    Lipid Panel    Component Value Date/Time   CHOL 115 05/05/2020 0419   CHOL 133 06/18/2015  0000   TRIG 35 05/12/2020 2227   TRIG 62 06/18/2015 0000   HDL 44 05/05/2020 0419   CHOLHDL 2.6 05/05/2020 0419   VLDL 13 05/05/2020 0419   LDLCALC 58 05/05/2020 0419   LDLCALC 57 06/18/2015 0000   LDLDIRECT 48.0 03/13/2016 1025       Wt Readings from Last 3 Encounters:  07/05/20 (!) 370 lb 3.2 oz (167.9 kg)  06/26/20 (!) 355 lb (161 kg)  06/18/20 (!) 352 lb 6.4 oz (159.8 kg)      Other studies Reviewed: Additional studies/ records that were reviewed today include: Hospital records. Review of the above records demonstrates:  Please see elsewhere in the note.     ASSESSMENT AND PLAN:  NICM- Her ejection fraction had improved.  She does not tolerate much in way of medications and stopped taking Entresto.  We will continue carvedilol.  We talked about as needed dosing of the diuretic and she could take an extra 40 mg as needed.  If she is having to do this a lot she will need probably switch to torsemide.   HTN- Her blood pressure is at target.  No change in therapy.   Sleep apnea- She is now being followed by Dr. Tresa Endo.  She uses BiPAP.  Hyponatremia - I will go ahead and order a basic metabolic profile.  Covid education - She still does not want get the vaccine.  We talked about this today.   Current medicines are reviewed at length with the patient today.  The patient does not have concerns regarding medicines.  The following changes have been made:  no change  Labs/ tests ordered today include: None  Orders Placed This Encounter  Procedures   Basic metabolic panel     Disposition:   FU with me as needed    Signed, Rollene Rotunda, MD  07/05/2020 6:01 PM    [facility]

## 2020-07-05 ENCOUNTER — Ambulatory Visit (INDEPENDENT_AMBULATORY_CARE_PROVIDER_SITE_OTHER): Payer: Medicare Other | Admitting: Cardiology

## 2020-07-05 ENCOUNTER — Other Ambulatory Visit: Payer: Self-pay

## 2020-07-05 ENCOUNTER — Encounter: Payer: Self-pay | Admitting: Cardiology

## 2020-07-05 VITALS — BP 139/51 | HR 71 | Ht 64.0 in | Wt 370.2 lb

## 2020-07-05 DIAGNOSIS — Z79899 Other long term (current) drug therapy: Secondary | ICD-10-CM | POA: Diagnosis not present

## 2020-07-05 DIAGNOSIS — I2510 Atherosclerotic heart disease of native coronary artery without angina pectoris: Secondary | ICD-10-CM | POA: Diagnosis not present

## 2020-07-05 MED ORDER — CARVEDILOL 6.25 MG PO TABS
6.2500 mg | ORAL_TABLET | Freq: Two times a day (BID) | ORAL | 3 refills | Status: DC
Start: 2020-07-05 — End: 2021-06-21

## 2020-07-05 NOTE — Patient Instructions (Signed)
Medication Instructions:  Your physician recommends that you continue on your current medications as directed. Please refer to the Current Medication list given to you today.  *If you need a refill on your cardiac medications before your next appointment, please call your pharmacy*  Lab Work: Your physician recommends that you return for lab work at your earliest convenience at your PCP office :   BMP  If you have labs (blood work) drawn today and your tests are completely normal, you will receive your results only by: Marland Kitchen MyChart Message (if you have MyChart) OR . A paper copy in the mail If you have any lab test that is abnormal or we need to change your treatment, we will call you to review the results.  Testing/Procedures: NONE ordered at this time of appointment   Follow-Up: At [facility], you and your health needs are our priority.  As part of our continuing mission to provide you with exceptional heart care, we have created designated Provider Care Teams.  These Care Teams include your primary Cardiologist (physician) and Advanced Practice Providers (APPs -  Physician Assistants and Nurse Practitioners) who all work together to provide you with the care you need, when you need it.  Your next appointment:   1 year(s)  The format for your next appointment:   In Person  Provider:   Rollene Rotunda, MD  Other Instructions

## 2020-07-06 DIAGNOSIS — I2510 Atherosclerotic heart disease of native coronary artery without angina pectoris: Secondary | ICD-10-CM | POA: Diagnosis not present

## 2020-07-06 DIAGNOSIS — J1282 Pneumonia due to coronavirus disease 2019: Secondary | ICD-10-CM | POA: Diagnosis not present

## 2020-07-06 DIAGNOSIS — U071 COVID-19: Secondary | ICD-10-CM | POA: Diagnosis not present

## 2020-07-06 DIAGNOSIS — I119 Hypertensive heart disease without heart failure: Secondary | ICD-10-CM | POA: Diagnosis not present

## 2020-07-06 DIAGNOSIS — J432 Centrilobular emphysema: Secondary | ICD-10-CM | POA: Diagnosis not present

## 2020-07-06 DIAGNOSIS — J9601 Acute respiratory failure with hypoxia: Secondary | ICD-10-CM | POA: Diagnosis not present

## 2020-07-09 ENCOUNTER — Other Ambulatory Visit: Payer: Self-pay

## 2020-07-09 ENCOUNTER — Ambulatory Visit (HOSPITAL_COMMUNITY)
Admission: RE | Admit: 2020-07-09 | Discharge: 2020-07-09 | Disposition: A | Discharge status: Home / Self Care | Payer: Medicare Other | Source: Ambulatory Visit | Attending: Physician Assistant | Admitting: Physician Assistant

## 2020-07-09 DIAGNOSIS — R1314 Dysphagia, pharyngoesophageal phase: Secondary | ICD-10-CM

## 2020-07-09 DIAGNOSIS — R112 Nausea with vomiting, unspecified: Secondary | ICD-10-CM

## 2020-07-09 DIAGNOSIS — K224 Dyskinesia of esophagus: Secondary | ICD-10-CM | POA: Diagnosis not present

## 2020-07-11 DIAGNOSIS — I2510 Atherosclerotic heart disease of native coronary artery without angina pectoris: Secondary | ICD-10-CM | POA: Diagnosis not present

## 2020-07-11 DIAGNOSIS — I119 Hypertensive heart disease without heart failure: Secondary | ICD-10-CM | POA: Diagnosis not present

## 2020-07-11 DIAGNOSIS — U071 COVID-19: Secondary | ICD-10-CM | POA: Diagnosis not present

## 2020-07-11 DIAGNOSIS — J9601 Acute respiratory failure with hypoxia: Secondary | ICD-10-CM | POA: Diagnosis not present

## 2020-07-11 DIAGNOSIS — J432 Centrilobular emphysema: Secondary | ICD-10-CM | POA: Diagnosis not present

## 2020-07-11 DIAGNOSIS — J1282 Pneumonia due to coronavirus disease 2019: Secondary | ICD-10-CM | POA: Diagnosis not present

## 2020-07-13 DIAGNOSIS — J1282 Pneumonia due to coronavirus disease 2019: Secondary | ICD-10-CM | POA: Diagnosis not present

## 2020-07-13 DIAGNOSIS — I2510 Atherosclerotic heart disease of native coronary artery without angina pectoris: Secondary | ICD-10-CM | POA: Diagnosis not present

## 2020-07-13 DIAGNOSIS — J9601 Acute respiratory failure with hypoxia: Secondary | ICD-10-CM | POA: Diagnosis not present

## 2020-07-13 DIAGNOSIS — U071 COVID-19: Secondary | ICD-10-CM | POA: Diagnosis not present

## 2020-07-13 DIAGNOSIS — I119 Hypertensive heart disease without heart failure: Secondary | ICD-10-CM | POA: Diagnosis not present

## 2020-07-13 DIAGNOSIS — J432 Centrilobular emphysema: Secondary | ICD-10-CM | POA: Diagnosis not present

## 2020-07-18 ENCOUNTER — Other Ambulatory Visit: Payer: Self-pay | Admitting: *Deleted

## 2020-07-18 NOTE — Patient Outreach (Signed)
[facility]) Care Management  07/18/2020  Amy Barnes 06/05/1951 [id number]   Call placed to member to follow up on ongoing recovery from Covid, no answer, HIPAA compliant voice message left.  Will follow up within the next week.  Kemper Durie, [place], MSN [facility] Care Management  [facility] Manager [phone]

## 2020-07-19 ENCOUNTER — Telehealth (INDEPENDENT_AMBULATORY_CARE_PROVIDER_SITE_OTHER): Payer: Medicare Other | Admitting: Internal Medicine

## 2020-07-19 ENCOUNTER — Other Ambulatory Visit: Payer: Self-pay

## 2020-07-19 ENCOUNTER — Encounter: Payer: Self-pay | Admitting: Internal Medicine

## 2020-07-19 DIAGNOSIS — J1282 Pneumonia due to coronavirus disease 2019: Secondary | ICD-10-CM | POA: Diagnosis not present

## 2020-07-19 DIAGNOSIS — J9601 Acute respiratory failure with hypoxia: Secondary | ICD-10-CM | POA: Diagnosis not present

## 2020-07-19 DIAGNOSIS — U071 COVID-19: Secondary | ICD-10-CM | POA: Diagnosis not present

## 2020-07-19 DIAGNOSIS — I2510 Atherosclerotic heart disease of native coronary artery without angina pectoris: Secondary | ICD-10-CM | POA: Diagnosis not present

## 2020-07-19 DIAGNOSIS — J432 Centrilobular emphysema: Secondary | ICD-10-CM | POA: Diagnosis not present

## 2020-07-19 DIAGNOSIS — T847XXD Infection and inflammatory reaction due to other internal orthopedic prosthetic devices, implants and grafts, subsequent encounter: Secondary | ICD-10-CM

## 2020-07-19 DIAGNOSIS — I119 Hypertensive heart disease without heart failure: Secondary | ICD-10-CM | POA: Diagnosis not present

## 2020-07-19 MED ORDER — SULFAMETHOXAZOLE-TRIMETHOPRIM 800-160 MG PO TABS: 1.0000 | ORAL_TABLET | Freq: Two times a day (BID) | ORAL | 0 refills | Status: DC

## 2020-07-19 NOTE — Progress Notes (Signed)
Virtual Visit via Telephone Note  I connected with Amy Barnes on 07/19/20 at  3:45 PM EDT by telephone and verified that I am speaking with the correct person using two identifiers.  Location: Patient: home Provider: clinic   I discussed the limitations, risks, security and privacy concerns of performing an evaluation and management service by telephone and the availability of in person appointments. I also discussed with the patient that there may be a patient responsible charge related to this service. The patient expressed understanding and agreed to proceed.   History of Present Illness: If weather change or extra mobility  Still using oxygen, then follow up with pulmonary if not improved  Cardiology with annual visits unless.  [name] how he feels -    Observations/Objective: Burning urination for 4 days  Assessment and Plan:  Follow up 4 months, bloodwork before next visit  Will be getting 7d of bactrim ds bid for uti  Follow Up Instructions:    I discussed the assessment and treatment plan with the patient. The patient was provided an opportunity to ask questions and all were answered. The patient agreed with the plan and demonstrated an understanding of the instructions.   The patient was advised to call back or seek an in-person evaluation if the symptoms worsen or if the condition fails to improve as anticipated.  I provided 10 minutes of non-face-to-face time during this encounter.   Judyann Munson, MD

## 2020-07-20 ENCOUNTER — Telehealth: Payer: Self-pay

## 2020-07-20 NOTE — Telephone Encounter (Signed)
Received call from patient pharmacy requesting different abx as she has a documented allergy to Bactrim. Spoke with Dr. Drue Second who would like to see how she tolerates as the allergy appears to be minor (no reported anaphylaxis). Notified patient of recommendation who stated she is going to start Monday as she will be going out of town this weekend and does not want to risk an allergic reaction while out of town. Will call with any questions or concerns once started.   Avant Printy Loyola Mast, RN

## 2020-07-23 ENCOUNTER — Other Ambulatory Visit: Payer: Self-pay | Admitting: Cardiology

## 2020-07-23 DIAGNOSIS — E039 Hypothyroidism, unspecified: Secondary | ICD-10-CM | POA: Diagnosis not present

## 2020-07-23 DIAGNOSIS — G4733 Obstructive sleep apnea (adult) (pediatric): Secondary | ICD-10-CM | POA: Diagnosis not present

## 2020-07-23 DIAGNOSIS — E1136 Type 2 diabetes mellitus with diabetic cataract: Secondary | ICD-10-CM | POA: Diagnosis not present

## 2020-07-23 DIAGNOSIS — F418 Other specified anxiety disorders: Secondary | ICD-10-CM | POA: Diagnosis not present

## 2020-07-23 DIAGNOSIS — I700 Atherosclerosis of aorta: Secondary | ICD-10-CM | POA: Diagnosis not present

## 2020-07-23 DIAGNOSIS — I482 Chronic atrial fibrillation, unspecified: Secondary | ICD-10-CM | POA: Diagnosis not present

## 2020-07-23 DIAGNOSIS — G2581 Restless legs syndrome: Secondary | ICD-10-CM | POA: Diagnosis not present

## 2020-07-23 DIAGNOSIS — E785 Hyperlipidemia, unspecified: Secondary | ICD-10-CM | POA: Diagnosis not present

## 2020-07-23 DIAGNOSIS — I2510 Atherosclerotic heart disease of native coronary artery without angina pectoris: Secondary | ICD-10-CM | POA: Diagnosis not present

## 2020-07-23 DIAGNOSIS — K219 Gastro-esophageal reflux disease without esophagitis: Secondary | ICD-10-CM | POA: Diagnosis not present

## 2020-07-23 DIAGNOSIS — J45909 Unspecified asthma, uncomplicated: Secondary | ICD-10-CM | POA: Diagnosis not present

## 2020-07-23 DIAGNOSIS — D649 Anemia, unspecified: Secondary | ICD-10-CM | POA: Diagnosis not present

## 2020-07-23 DIAGNOSIS — E1142 Type 2 diabetes mellitus with diabetic polyneuropathy: Secondary | ICD-10-CM | POA: Diagnosis not present

## 2020-07-23 DIAGNOSIS — E871 Hypo-osmolality and hyponatremia: Secondary | ICD-10-CM | POA: Diagnosis not present

## 2020-07-23 DIAGNOSIS — U071 COVID-19: Secondary | ICD-10-CM | POA: Diagnosis not present

## 2020-07-23 DIAGNOSIS — E1165 Type 2 diabetes mellitus with hyperglycemia: Secondary | ICD-10-CM | POA: Diagnosis not present

## 2020-07-23 DIAGNOSIS — I119 Hypertensive heart disease without heart failure: Secondary | ICD-10-CM | POA: Diagnosis not present

## 2020-07-23 DIAGNOSIS — J9601 Acute respiratory failure with hypoxia: Secondary | ICD-10-CM | POA: Diagnosis not present

## 2020-07-23 DIAGNOSIS — M170 Bilateral primary osteoarthritis of knee: Secondary | ICD-10-CM | POA: Diagnosis not present

## 2020-07-23 DIAGNOSIS — K5903 Drug induced constipation: Secondary | ICD-10-CM | POA: Diagnosis not present

## 2020-07-23 DIAGNOSIS — F319 Bipolar disorder, unspecified: Secondary | ICD-10-CM | POA: Diagnosis not present

## 2020-07-23 DIAGNOSIS — J1282 Pneumonia due to coronavirus disease 2019: Secondary | ICD-10-CM | POA: Diagnosis not present

## 2020-07-23 DIAGNOSIS — M8580 Other specified disorders of bone density and structure, unspecified site: Secondary | ICD-10-CM | POA: Diagnosis not present

## 2020-07-23 DIAGNOSIS — M47818 Spondylosis without myelopathy or radiculopathy, sacral and sacrococcygeal region: Secondary | ICD-10-CM | POA: Diagnosis not present

## 2020-07-23 DIAGNOSIS — J432 Centrilobular emphysema: Secondary | ICD-10-CM | POA: Diagnosis not present

## 2020-07-26 ENCOUNTER — Other Ambulatory Visit: Payer: Self-pay | Admitting: *Deleted

## 2020-07-26 DIAGNOSIS — U071 COVID-19: Secondary | ICD-10-CM | POA: Diagnosis not present

## 2020-07-26 DIAGNOSIS — M170 Bilateral primary osteoarthritis of knee: Secondary | ICD-10-CM | POA: Diagnosis not present

## 2020-07-26 DIAGNOSIS — J9601 Acute respiratory failure with hypoxia: Secondary | ICD-10-CM | POA: Diagnosis not present

## 2020-07-26 DIAGNOSIS — J1282 Pneumonia due to coronavirus disease 2019: Secondary | ICD-10-CM | POA: Diagnosis not present

## 2020-07-26 DIAGNOSIS — E1165 Type 2 diabetes mellitus with hyperglycemia: Secondary | ICD-10-CM | POA: Diagnosis not present

## 2020-07-26 DIAGNOSIS — J432 Centrilobular emphysema: Secondary | ICD-10-CM | POA: Diagnosis not present

## 2020-07-26 NOTE — Patient Outreach (Signed)
[facility]) Care Management  07/26/2020  Amy Barnes [birth year] [id number]   Call placed to member to follow up on recovery from Covid.  State she is still having exertional shortness of breath, having to use her oxygen continuously.  Noted that her daughter has contacted her cardiology office to inquire about changing her diuretics as member has been experiencing swelling in lower extremities.  Member wasn't aware of need to have labs prior to medication change.  State her daughter usually takes care of things like that but she has been out of town  She will speak to her today and advise her of need for labs.   Appointments scheduled with PCP on 11/12 and sleep clinic on 11/16.  State she does not like to wear her CPAP due to it not sealing properly, advised to discuss during visit and inquire about fitting for new mask.  She verbalizes understanding, denies any urgent concerns.  Agrees to follow up within the next month.  Goals Addressed              This Visit's Progress   .  COMPLETED: Recover from Covid (pt-stated)        CARE PLAN ENTRY (see longtitudinal plan of care for additional care plan information)  Current Barriers:  Marland Kitchen Knowledge Deficits related to COVID-19 and impact on patient self health management  Clinical Goal(s):  Marland Kitchen Over the next 30 days, patient will verbalize basic understanding of COVID-19 impact on individual health and self health management as evidenced by verbalization of basic understanding of COVID-19 as a viral disease, measures to prevent exposure, signs and symptoms, when to contact provider  Interventions: . Advised patient to call PCP for follow up . Discussed plans with patient for ongoing care management follow up and provided patient with direct contact information for care management team . Provided patient with EMMI  educational materials related to Covid recovery  Patient Self Care Activities:  . Attends all scheduled  provider appointments . Performs ADL's independently  UPDATE 9/24 - recovering, still having nausea/vomiting and loss of taste/smell    Resolving due to duplicate goal              .  [facility] - Track and Manage Activity and Exertion        Follow Up Date 11/28   - drink water to stay hydrated during exercise - follow activity or exercise plan - track symptoms during activity in diary    Why is this important?   Exercising is very important when managing your heart failure.  It will help your heart get stronger.    Notes:     .  [facility] - Track and Manage Symptoms        Follow Up Date 12/15   - begin a heart failure diary - follow rescue plan if symptoms flare-up - know when to call the doctor - track symptoms and what helps feel better or worse    Why is this important?   You will be able to handle your symptoms better if you keep track of them.  Making some simple changes to your lifestyle will help.  Eating healthy is one thing you can do to take good care of yourself.    Notes:       Kemper Durie, RN, MSN [facility] Care Management  [facility] Manager [phone]

## 2020-08-01 DIAGNOSIS — U071 COVID-19: Secondary | ICD-10-CM | POA: Diagnosis not present

## 2020-08-01 DIAGNOSIS — J1282 Pneumonia due to coronavirus disease 2019: Secondary | ICD-10-CM | POA: Diagnosis not present

## 2020-08-01 DIAGNOSIS — J9601 Acute respiratory failure with hypoxia: Secondary | ICD-10-CM | POA: Diagnosis not present

## 2020-08-01 DIAGNOSIS — M170 Bilateral primary osteoarthritis of knee: Secondary | ICD-10-CM | POA: Diagnosis not present

## 2020-08-01 DIAGNOSIS — E1165 Type 2 diabetes mellitus with hyperglycemia: Secondary | ICD-10-CM | POA: Diagnosis not present

## 2020-08-01 DIAGNOSIS — J432 Centrilobular emphysema: Secondary | ICD-10-CM | POA: Diagnosis not present

## 2020-08-06 ENCOUNTER — Encounter: Payer: Self-pay | Admitting: Family Medicine

## 2020-08-07 DIAGNOSIS — M8580 Other specified disorders of bone density and structure, unspecified site: Secondary | ICD-10-CM

## 2020-08-07 DIAGNOSIS — E1165 Type 2 diabetes mellitus with hyperglycemia: Secondary | ICD-10-CM

## 2020-08-07 DIAGNOSIS — U071 COVID-19: Secondary | ICD-10-CM

## 2020-08-07 DIAGNOSIS — F319 Bipolar disorder, unspecified: Secondary | ICD-10-CM

## 2020-08-07 DIAGNOSIS — J432 Centrilobular emphysema: Secondary | ICD-10-CM

## 2020-08-07 DIAGNOSIS — Z9181 History of falling: Secondary | ICD-10-CM

## 2020-08-07 DIAGNOSIS — J45909 Unspecified asthma, uncomplicated: Secondary | ICD-10-CM

## 2020-08-07 DIAGNOSIS — D649 Anemia, unspecified: Secondary | ICD-10-CM

## 2020-08-07 DIAGNOSIS — Z87440 Personal history of urinary (tract) infections: Secondary | ICD-10-CM

## 2020-08-07 DIAGNOSIS — Z8614 Personal history of Methicillin resistant Staphylococcus aureus infection: Secondary | ICD-10-CM

## 2020-08-07 DIAGNOSIS — I482 Chronic atrial fibrillation, unspecified: Secondary | ICD-10-CM

## 2020-08-07 DIAGNOSIS — J1282 Pneumonia due to coronavirus disease 2019: Secondary | ICD-10-CM

## 2020-08-07 DIAGNOSIS — M47818 Spondylosis without myelopathy or radiculopathy, sacral and sacrococcygeal region: Secondary | ICD-10-CM

## 2020-08-07 DIAGNOSIS — K5903 Drug induced constipation: Secondary | ICD-10-CM

## 2020-08-07 DIAGNOSIS — M170 Bilateral primary osteoarthritis of knee: Secondary | ICD-10-CM

## 2020-08-07 DIAGNOSIS — I119 Hypertensive heart disease without heart failure: Secondary | ICD-10-CM

## 2020-08-07 DIAGNOSIS — J9601 Acute respiratory failure with hypoxia: Secondary | ICD-10-CM

## 2020-08-07 DIAGNOSIS — Z7951 Long term (current) use of inhaled steroids: Secondary | ICD-10-CM

## 2020-08-07 DIAGNOSIS — Z7984 Long term (current) use of oral hypoglycemic drugs: Secondary | ICD-10-CM

## 2020-08-07 DIAGNOSIS — Z9981 Dependence on supplemental oxygen: Secondary | ICD-10-CM

## 2020-08-07 DIAGNOSIS — Z87891 Personal history of nicotine dependence: Secondary | ICD-10-CM

## 2020-08-07 DIAGNOSIS — E1142 Type 2 diabetes mellitus with diabetic polyneuropathy: Secondary | ICD-10-CM

## 2020-08-07 DIAGNOSIS — Z6841 Body Mass Index (BMI) 40.0 and over, adult: Secondary | ICD-10-CM

## 2020-08-07 DIAGNOSIS — E039 Hypothyroidism, unspecified: Secondary | ICD-10-CM

## 2020-08-07 DIAGNOSIS — G2581 Restless legs syndrome: Secondary | ICD-10-CM

## 2020-08-07 DIAGNOSIS — G4733 Obstructive sleep apnea (adult) (pediatric): Secondary | ICD-10-CM

## 2020-08-07 DIAGNOSIS — Z981 Arthrodesis status: Secondary | ICD-10-CM

## 2020-08-07 DIAGNOSIS — E785 Hyperlipidemia, unspecified: Secondary | ICD-10-CM

## 2020-08-07 DIAGNOSIS — F418 Other specified anxiety disorders: Secondary | ICD-10-CM

## 2020-08-07 DIAGNOSIS — Z792 Long term (current) use of antibiotics: Secondary | ICD-10-CM

## 2020-08-07 DIAGNOSIS — K219 Gastro-esophageal reflux disease without esophagitis: Secondary | ICD-10-CM

## 2020-08-07 DIAGNOSIS — E1136 Type 2 diabetes mellitus with diabetic cataract: Secondary | ICD-10-CM

## 2020-08-07 DIAGNOSIS — Z96643 Presence of artificial hip joint, bilateral: Secondary | ICD-10-CM

## 2020-08-07 DIAGNOSIS — E871 Hypo-osmolality and hyponatremia: Secondary | ICD-10-CM

## 2020-08-07 DIAGNOSIS — I700 Atherosclerosis of aorta: Secondary | ICD-10-CM

## 2020-08-07 DIAGNOSIS — I2510 Atherosclerotic heart disease of native coronary artery without angina pectoris: Secondary | ICD-10-CM

## 2020-08-07 DIAGNOSIS — Z7982 Long term (current) use of aspirin: Secondary | ICD-10-CM

## 2020-08-10 ENCOUNTER — Encounter: Payer: Self-pay | Admitting: Family Medicine

## 2020-08-10 ENCOUNTER — Ambulatory Visit (INDEPENDENT_AMBULATORY_CARE_PROVIDER_SITE_OTHER): Payer: Medicare Other | Admitting: Family Medicine

## 2020-08-10 ENCOUNTER — Other Ambulatory Visit: Payer: Self-pay

## 2020-08-10 VITALS — BP 130/82 | HR 63 | Temp 97.6°F | Ht 64.0 in | Wt 362.0 lb

## 2020-08-10 DIAGNOSIS — E1142 Type 2 diabetes mellitus with diabetic polyneuropathy: Secondary | ICD-10-CM | POA: Diagnosis not present

## 2020-08-10 DIAGNOSIS — I5020 Unspecified systolic (congestive) heart failure: Secondary | ICD-10-CM

## 2020-08-10 DIAGNOSIS — I2510 Atherosclerotic heart disease of native coronary artery without angina pectoris: Secondary | ICD-10-CM | POA: Diagnosis not present

## 2020-08-10 DIAGNOSIS — E662 Morbid (severe) obesity with alveolar hypoventilation: Secondary | ICD-10-CM | POA: Diagnosis not present

## 2020-08-10 DIAGNOSIS — I428 Other cardiomyopathies: Secondary | ICD-10-CM | POA: Diagnosis not present

## 2020-08-10 DIAGNOSIS — G4733 Obstructive sleep apnea (adult) (pediatric): Secondary | ICD-10-CM

## 2020-08-10 DIAGNOSIS — Z9989 Dependence on other enabling machines and devices: Secondary | ICD-10-CM

## 2020-08-10 DIAGNOSIS — Z23 Encounter for immunization: Secondary | ICD-10-CM

## 2020-08-10 DIAGNOSIS — Z79899 Other long term (current) drug therapy: Secondary | ICD-10-CM | POA: Diagnosis not present

## 2020-08-10 DIAGNOSIS — R112 Nausea with vomiting, unspecified: Secondary | ICD-10-CM

## 2020-08-10 LAB — POCT GLYCOSYLATED HEMOGLOBIN (HGB A1C): Hemoglobin A1C: 6.5 % — AB (ref 4.0–5.6)

## 2020-08-10 MED ORDER — METFORMIN HCL 500 MG PO TABS
500.0000 mg | ORAL_TABLET | Freq: Every day | ORAL | 3 refills | Status: DC
Start: 2020-08-10 — End: 2022-02-18

## 2020-08-10 NOTE — Assessment & Plan Note (Signed)
A1c with marked improvement now off steroid.  Continue metformin 500mg  once daily. Upcoming eye exam. Foot exam today.

## 2020-08-10 NOTE — Progress Notes (Signed)
Patient ID: Amy Barnes, female    DOB: April 08, 1951, 69 y.o.   MRN: [id number]  This visit was conducted in person.  BP 130/82 (BP Location: Right Arm, Patient Position: Sitting, Cuff Size: Large) Comment (Cuff Size): Thigh cuff  Pulse 63   Temp 97.6 F (36.4 C) (Temporal)   Ht 5\' 4"  (1.626 m)   Wt (!) 362 lb (164.2 kg)   LMP  (LMP Unknown)   SpO2 97% Comment: 1 L  BMI 62.14 kg/m    CC: 6 mo f/u visit  Subjective:   HPI: Amy Barnes is a 69 y.o. female presenting on 08/10/2020 for Follow-up (Here for 6 mo f/u.  Pt accompanined by husband, Jim- temp 97.9.)   Chronic nausea/vomiting s/p unrevealing GI eval (EGD, gastric emptying study, CTs, 13/08/2020, HID scan with CCK. Presumed due to polypharmacy.   OSA sees Dr [place] - sleep study showed mod overall sleep apnea, but severe during REM sleep with desat to 69%. Transitioned to BiPAP for best effect. Planned in-office f/u later this month.   Fluid restricted diet to ~50oz/day, avoiding salt. Continues furosemide 40mg  daily along with potassium Tresa Endo daily.   DM - does not regularly check sugars. Compliant with antihyperglycemic regimen which includes: metformin 500mg . Denies low sugars or hypoglycemic symptoms. Occasional paresthesias to toes. Last diabetic eye exam 08/2019. Pneumovax: 12/2019. Prevnar: 2019. Glucometer brand: [facility] brand. DSME: [facility] 02/2019. Lab Results  Component Value Date   HGBA1C 6.5 (A) 08/10/2020   Diabetic Foot Exam - Simple   Simple Foot Form Diabetic Foot exam was performed with the following findings: Yes 08/10/2020 12:22 PM  Visual Inspection No deformities, no ulcerations, no other skin breakdown bilaterally: Yes Sensation Testing Intact to touch and monofilament testing bilaterally: Yes Pulse Check Posterior Tibialis and Dorsalis pulse intact bilaterally: Yes Comments    Lab Results  Component Value Date   MICROALBUR 0.3 08/19/2010    Saw ID Dr 13/08/2020 last month, treated with 7d bactrim course.  UTI symptoms resolved. She tolerated med well.      Relevant past medical, surgical, family and social history reviewed and updated as indicated. Interim medical history since our last visit reviewed. Allergies and medications reviewed and updated. Outpatient Medications Prior to Visit  Medication Sig Dispense Refill  . albuterol (PROVENTIL HFA;VENTOLIN HFA) 108 (90 Base) MCG/ACT inhaler Inhale 2 puffs into the lungs every 6 (six) hours as needed for wheezing or shortness of breath. 1 Inhaler 6  . aspirin 81 MG EC tablet Take 81 mg by mouth at bedtime.     . budesonide-formoterol (SYMBICORT) 160-4.5 MCG/ACT inhaler Inhale 2 puffs into the lungs 2 (two) times daily. 1 Inhaler 3  . carvedilol (COREG) 6.25 MG tablet Take 1 tablet (6.25 mg total) by mouth 2 (two) times daily. 180 tablet 3  . dicyclomine (BENTYL) 20 MG tablet Take 1 tablet (20 mg total) by mouth in the morning and at bedtime. (Patient taking differently: Take 20 mg by mouth in the morning and at bedtime. As needed) 60 tablet 2  . doxycycline (VIBRA-TABS) 100 MG tablet Take 1 tablet (100 mg total) by mouth 2 (two) times daily. 180 tablet 3  . FLUoxetine (PROZAC) 40 MG capsule Take 40 mg by mouth daily.     13/08/2020 gabapentin (NEURONTIN) 100 MG capsule Take 1-2 capsules (100-200 mg total) by mouth at bedtime. 60 capsule 1  . lamoTRIgine (LAMICTAL) 200 MG tablet Take 200 mg by mouth daily.      08/21/2010  levothyroxine (SYNTHROID) 150 MCG tablet TAKE ONE TABLET BY MOUTH AT BEDTIME (Patient taking differently: Take 150 mcg by mouth daily before breakfast. ) 90 tablet 3  . montelukast (SINGULAIR) 10 MG tablet Take 1 tablet (10 mg total) by mouth at bedtime. 90 tablet 3  . Multiple Vitamin (MULTIVITAMIN WITH MINERALS) TABS tablet Take 2 tablets by mouth daily.     . ondansetron (ZOFRAN ODT) 8 MG disintegrating tablet Take 1 tablet (8 mg total) by mouth 2 (two) times daily as needed for nausea or vomiting. 10 tablet 0  . Oxcarbazepine (TRILEPTAL) 300 MG  tablet Take 300 mg by mouth 2 (two) times daily.     . pantoprazole (PROTONIX) 40 MG tablet Take 40 mg by mouth 2 (two) times daily.     . polyethylene glycol (MIRALAX / GLYCOLAX) 17 g packet Take 17 g by mouth 2 (two) times daily as needed for mild constipation. 14 each 0  . potassium chloride (KLOR-CON) 10 MEQ tablet Take 1 tablet (10 mEq total) by mouth daily. With lasix 30 tablet 3  . prochlorperazine (COMPAZINE) 10 MG tablet Take 1 tablet (10 mg total) by mouth every 6 (six) hours as needed for nausea or vomiting. 30 tablet 1  . promethazine (PHENERGAN) 25 MG suppository Place 1 suppository (25 mg total) rectally every 6 (six) hours as needed for nausea or vomiting. 12 each 0  . promethazine (PHENERGAN) 25 MG/ML injection Inject 1 mL (25 mg total) into the muscle once for 1 dose. 1 mL 0  . simvastatin (ZOCOR) 40 MG tablet Take 1 tablet (40 mg total) by mouth every evening. 90 tablet 1  . sucralfate (CARAFATE) 1 GM/10ML suspension Take 10 mLs (1 g total) by mouth 4 (four) times daily -  with meals and at bedtime. 420 mL 0  . zaleplon (SONATA) 10 MG capsule Take 10 mg by mouth at bedtime as needed for sleep.     . metFORMIN (GLUCOPHAGE) 500 MG tablet Take 1 tablet (500 mg total) by mouth daily with breakfast. 30 tablet 6  . furosemide (LASIX) 40 MG tablet Take 1 tablet (40 mg total) by mouth daily. 30 tablet 0  . sulfamethoxazole-trimethoprim (BACTRIM DS) 800-160 MG tablet Take 1 tablet by mouth 2 (two) times daily. 14 tablet 0   No facility-administered medications prior to visit.     Per HPI unless specifically indicated in ROS section below Review of Systems Objective:  BP 130/82 (BP Location: Right Arm, Patient Position: Sitting, Cuff Size: Large) Comment (Cuff Size): Thigh cuff  Pulse 63   Temp 97.6 F (36.4 C) (Temporal)   Ht 5\' 4"  (1.626 m)   Wt (!) 362 lb (164.2 kg)   LMP  (LMP Unknown)   SpO2 97% Comment: 1 L  BMI 62.14 kg/m   Wt Readings from Last 3 Encounters:  08/10/20  (!) 362 lb (164.2 kg)  07/05/20 (!) 370 lb 3.2 oz (167.9 kg)  06/26/20 (!) 355 lb (161 kg)      Physical Exam Vitals and nursing note reviewed.  Constitutional:      General: She is not in acute distress.    Appearance: Normal appearance. She is well-developed. She is morbidly obese. She is not ill-appearing.  Eyes:     General: No scleral icterus.    Extraocular Movements: Extraocular movements intact.     Conjunctiva/sclera: Conjunctivae normal.     Pupils: Pupils are equal, round, and reactive to light.  Cardiovascular:     Rate and Rhythm: Normal  rate and regular rhythm.     Pulses: Normal pulses.     Heart sounds: Normal heart sounds. No murmur heard.   Pulmonary:     Effort: Pulmonary effort is normal. No respiratory distress.     Breath sounds: Normal breath sounds. No wheezing, rhonchi or rales.  Musculoskeletal:        General: Normal range of motion.     Cervical back: Normal range of motion and neck supple.     Right lower leg: No edema.     Left lower leg: No edema.     Comments: See HPI for foot exam if done  Lymphadenopathy:     Cervical: No cervical adenopathy.  Skin:    General: Skin is warm and dry.     Findings: No rash.  Neurological:     Mental Status: She is alert.  Psychiatric:        Mood and Affect: Mood normal.        Behavior: Behavior normal.       Results for orders placed or performed in visit on 08/10/20  POCT glycosylated hemoglobin (Hb A1C)  Result Value Ref Range   Hemoglobin A1C 6.5 (A) 4.0 - 5.6 %   HbA1c POC (<> result, manual entry)     HbA1c, POC (prediabetic range)     HbA1c, POC (controlled diabetic range)     Assessment & Plan:  This visit occurred during the SARS-CoV-2 public health emergency.  Safety protocols were in place, including screening questions prior to the visit, additional usage of staff PPE, and extensive cleaning of exam room while observing appropriate contact time as indicated for disinfecting solutions.    Problem List Items Addressed This Visit    Polypharmacy   OSA on CPAP    Appreciate cards care. Planning to see Dr Tresa Endo later this month.       Obesity hypoventilation syndrome (HCC)    She has been able to wean oxygen down - to RA at rest, 2L with exertion.       Relevant Medications   metFORMIN (GLUCOPHAGE) 500 MG tablet   Non-ischemic cardiomyopathy (HCC)   Nausea and vomiting    Chronic, currently stable. S/p GI eval. Thought polypharmacy contributing.  Continues PPI BID with PRN carafate       HFrEF (heart failure with reduced ejection fraction) (HCC)    Sees cardiology, following fluid restricted diet.  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  Continue metformin 500mg  once daily. Upcoming eye exam. Foot exam today.       Relevant Medications   metFORMIN (GLUCOPHAGE) 500 MG tablet   Other Relevant Orders   POCT glycosylated hemoglobin (Hb A1C) (Completed)    Other Visit Diagnoses    Need for influenza vaccination       Relevant Orders   Flu Vaccine QUAD High Dose(Fluad) (Completed)       Meds ordered this encounter  Medications  . metFORMIN (GLUCOPHAGE) 500 MG tablet    Sig: Take 1 tablet (500 mg total) by mouth daily with breakfast.    Dispense:  90 tablet    Refill:  3   Orders Placed This Encounter  Procedures  . Flu Vaccine QUAD High Dose(Fluad)  . POCT glycosylated hemoglobin (Hb A1C)    Patient Instructions  You are doing well today A1c shows well controlled diabetes.  Have  eye doctor send [place] his report.  Return in 6 months for next wellness visit    Follow up plan: Return in about 6 months (around 02/07/2021) for medicare wellness visit.  [name], MD

## 2020-08-10 NOTE — Assessment & Plan Note (Addendum)
Sees cardiology, following fluid restricted diet.  Continues lasix, carvedilol, aspirin daily

## 2020-08-10 NOTE — Patient Instructions (Addendum)
You are doing well today A1c shows well controlled diabetes.  Have eye doctor send [place] his report.  Return in 6 months for next wellness visit

## 2020-08-10 NOTE — Assessment & Plan Note (Signed)
She has been able to wean oxygen down - to RA at rest, 2L with exertion.

## 2020-08-10 NOTE — Assessment & Plan Note (Addendum)
Chronic, currently stable. S/p GI eval. Thought polypharmacy contributing.  Continues PPI BID with PRN carafate

## 2020-08-10 NOTE — Assessment & Plan Note (Addendum)
Appreciate cards care. Planning to see Dr Tresa Endo later this month.

## 2020-08-10 NOTE — Telephone Encounter (Signed)
Patient seen today

## 2020-08-14 ENCOUNTER — Other Ambulatory Visit: Payer: Self-pay

## 2020-08-14 ENCOUNTER — Ambulatory Visit (INDEPENDENT_AMBULATORY_CARE_PROVIDER_SITE_OTHER): Payer: Medicare Other | Admitting: Cardiovascular Disease

## 2020-08-14 ENCOUNTER — Encounter: Payer: Self-pay | Admitting: Cardiovascular Disease

## 2020-08-14 VITALS — BP 115/69 | HR 68 | Ht 64.0 in | Wt 371.0 lb

## 2020-08-14 DIAGNOSIS — I10 Essential (primary) hypertension: Secondary | ICD-10-CM

## 2020-08-14 DIAGNOSIS — I428 Other cardiomyopathies: Secondary | ICD-10-CM

## 2020-08-14 DIAGNOSIS — Z6841 Body Mass Index (BMI) 40.0 and over, adult: Secondary | ICD-10-CM | POA: Diagnosis not present

## 2020-08-14 DIAGNOSIS — G4733 Obstructive sleep apnea (adult) (pediatric): Secondary | ICD-10-CM

## 2020-08-14 NOTE — Progress Notes (Signed)
Cardiology Office Note    Date:  08/19/2020   ID:  Amy WENIGER, DOB [birth year], MRN [id number]  PCP:  Ria Bush, MD  Cardiologist:  Shelva Majestic, MD (sleep); Dr. Percival Spanish    History of Present Illness:  Amy Barnes is a 69 y.o. female  who has a history of obesitywith a BMI >60, hypertension, COPD, remote sleep apnea, chronic back pain/ infection post surgery, and LBBB. She was admitted to the emergency room in February 2021 with nausea and vomiting. Her troponin was elevated. Her D-dimer was elevated. She had a CT scan that was negative for pulmonary embolism but demonstrated coronary calcification and congestive heart failure.  An echo Doppler study in February 2021 showed an EF of 35 to 40% with apical wall motion abnormality.  Definitive catheterization showed normal coronary arteries.  She was treated as a nonischemic cardiomyopathy and therapy was initiated with Entresto, furosemide and carvedilol.  Due to concerns for progressive significant sleep apnea, she was referred for diagnostic polysomnogram which was performed on January 23, 2020.  This demonstrated moderate overall sleep apnea with an AHI of 20.6/h; however, events were very severe during REM sleep with an AHI of 59.3/h.  She had severe oxygen desaturation to a nadir of 67% and time spent less than 89% was 293 minutes.  She ultimately was referred for a in lab CPAP titration study.  This was done on April 15, 2020.  CPAP was titrated up to 18 cm and due to continued events  was transitioned to BiPAP and she required pressure titration up to 24/20.  She recently was hospitalized with COVID-19 pneumonia from August 5 through May 08, 2020 treated with remdesevir, IV steroids, oxygen therapy and had low oxygen saturations.  She had been unvaccinated.  Shortly after going home, she required rehospitalization from August 14 through August 24 due to worsening shortness of breath and respiratory failure secondary to  COVID-19 pneumonitis.  Following her hospitalization she had periods of continued nausea and vomiting.  She was recently started on BiPAP therapy but initially felt the pressures were too high.  I evaluated her in a telemedicine visit in September 2021.  The week prior to that evaluation she had  contacted our office and I had reduced her pressures to an EPAP minimum of 15 with an IPAP maximum of 25.  She has been using a full facemask which was recently changed from the one that she had gotten from the lab which was too small.  Over the last several days she has used BiPAP with her new settings and new mask and has noted significant benefit.  A download over the past several days has shown 100% use with average usage at 8 hours and 31 minutes.  AHI is 0.1 with a 95th percentile pressure at 19.9/15.8 and a maximum average pressure of 20.6/16.6.    During that evaluation I had a lengthy discussion with her regarding her sleep apnea and the requirement of her high pressures.  I discussed the effects of untreated sleep apnea on her cardiovascular health.  I raised concern that if she continued to have issues with hypoxemia she may very well with a component of obesity/hypoventilation syndrome.  She was on supplemental oxygen.  I recommended a 20-monthfollow-up evaluation  in person for further assessment.  Since her initial evaluation she admits to improved usage to her BiPAP therapy.  I obtained a download from October 17 through August 13, 2020.  Compliance was excellent and  she was averaging 7 hours and 55 minutes of CPAP use per night.  Her minimum EPAP pressure was set at 15 with a maximum IPAP pressure of 25.  95th percentile pressure was 18.7/15.7 with a maximum average pressure at 20.8/16.8.  Typically she goes to bed at 10 PM and wakes up around 8 AM.  She has a full facemask F 20 and there is no significant leak.  An Epworth Sleepiness Scale score was calculated in the office today this is worse at 11.   She presents for an in person evaluation.  Past Medical History:  Diagnosis Date  . Allergic rhinitis   . Ambulates with cane    straight  . Anemia   . Anxiety   . Asthma    seasonal  . Bipolar affective disorder ([place])    takes Synthroid meds for Bipolar  . CAD (coronary artery disease) 07/2016   by CT scan  . Carpal tunnel syndrome    had surgery but occasional still has some issues per patient  . Cataract   . Centrilobular emphysema ([place]) 07/2016   by CT scan - pt not aware of this  . Constipation due to pain medication   . Depression with anxiety   . Diabetes mellitus    type 2 - no meds diet controlled  . GERD (gastroesophageal reflux disease)   . History of blood transfusion   . 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 Hyperlipidemia   . Hypertension   . Hypothyroidism   . OSA (obstructive sleep apnea)    no longer using cpap, uses a bed that raises and lowers hob  . Osteoarthritis   . Osteopenia 08/11/2019   DEXA 07/2019 - T -1.1 R femur (osteopenia)  . Pneumonia   . Restless legs   . Septic arthritis ([place]) 10/11/2012  . Shingles 06/30/2016  . Status post revision of total hip replacement bilateral   prosthetic infection R 2013, L 2015  . Thoracic aortic atherosclerosis ([place]) 11/207   by CT    Past Surgical History:  Procedure Laterality Date  . BREAST CYST ASPIRATION    . CARPAL TUNNEL RELEASE Right 05/15/2011  . CARPAL TUNNEL RELEASE Left 12/24/2017   Procedure: LEFT CARPAL TUNNEL RELEASE;  Surgeon: Leanora Cover, MD;  Location: [facility];  Service: Orthopedics;  Laterality: Left;  . CERVICAL FUSION  2012   C2/3/4  . COLONOSCOPY WITH PROPOFOL N/A 12/31/2015   diverticulosis, int hem, o/w normal rpt 10 yrs ([place])  . ESOPHAGOGASTRODUODENOSCOPY (EGD) WITH PROPOFOL N/A 11/12/2019   Procedure: ESOPHAGOGASTRODUODENOSCOPY (EGD) WITH PROPOFOL;  Surgeon: Doran Stabler, MD;  Location: [facility];  Service:  Gastroenterology;  Laterality: N/A;  . EYE SURGERY Bilateral    cataract surgery with lens implant  . FOOT SURGERY  1982   bone spur  . I & D EXTREMITY  09/06/2012   [name], MD; Right;  I&D of right thigh  . I & D EXTREMITY Left 07/2013   wound vac - daily doxycycline indefinitely  . KNEE ARTHROSCOPY  09/01/2012   [name], MD;  Right  . LEFT HEART CATH AND CORONARY ANGIOGRAPHY N/A 11/10/2019   Procedure: LEFT HEART CATH AND CORONARY ANGIOGRAPHY;  Surgeon: Troy Sine, MD;  Location: [facility];  Service: Cardiovascular;  Laterality: N/A;  . LUMBAR FUSION  01/08/2012   L3-4  . LUMBAR FUSION  02/2019   unexpectedly discovered MRSA infection -  pus [name])   . LUMBAR LAMINECTOMY/DECOMPRESSION MICRODISCECTOMY N/A 11/13/2016   LAMINOTOMY/LAMINECTOMY LUMBAR FOUR LUMBAR FIVE  WITH RESECTION OF SYNOVIAL CYST;  Surgeon: Newman Pies, MD  . NECK SURGERY     Herniated disk C2,3,4  . NOSE SURGERY    . PARTIAL HYSTERECTOMY  1984   for mennorhagia, ovaries remain  . REVISION TOTAL HIP ARTHROPLASTY Left 08/28/2011  . TMJ ARTHROPLASTY  1982  . TOTAL HIP ARTHROPLASTY Left 2002  . TRIGGER FINGER RELEASE Right 05/15/2011   long finger    Current Medications: Outpatient Medications Prior to Visit  Medication Sig Dispense Refill  . albuterol (PROVENTIL HFA;VENTOLIN HFA) 108 (90 Base) MCG/ACT inhaler Inhale 2 puffs into the lungs every 6 (six) hours as needed for wheezing or shortness of breath. 1 Inhaler 6  . aspirin 81 MG EC tablet Take 81 mg by mouth at bedtime.     . budesonide-formoterol (SYMBICORT) 160-4.5 MCG/ACT inhaler Inhale 2 puffs into the lungs 2 (two) times daily. (Patient taking differently: Inhale 2 puffs into the lungs 2 (two) times daily. Pt uses as needed.) 1 Inhaler 3  . carvedilol (COREG) 6.25 MG tablet Take 1 tablet (6.25 mg total) by mouth 2 (two) times daily. 180 tablet 3  . dicyclomine (BENTYL) 20 MG tablet Take 1 tablet (20 mg total) by mouth in the  morning and at bedtime. (Patient taking differently: Take 20 mg by mouth in the morning and at bedtime. As needed) 60 tablet 2  . doxycycline (VIBRA-TABS) 100 MG tablet Take 1 tablet (100 mg total) by mouth 2 (two) times daily. 180 tablet 3  . FLUoxetine (PROZAC) 40 MG capsule Take 40 mg by mouth daily.     Marland Kitchen gabapentin (NEURONTIN) 100 MG capsule Take 1-2 capsules (100-200 mg total) by mouth at bedtime. 60 capsule 1  . lamoTRIgine (LAMICTAL) 200 MG tablet Take 200 mg by mouth daily.      Marland Kitchen levothyroxine (SYNTHROID) 150 MCG tablet TAKE ONE TABLET BY MOUTH AT BEDTIME (Patient taking differently: Take 150 mcg by mouth daily before breakfast. Pt takes at bedtime.) 90 tablet 3  . metFORMIN (GLUCOPHAGE) 500 MG tablet Take 1 tablet (500 mg total) by mouth daily with breakfast. (Patient taking differently: Take 500 mg by mouth daily with breakfast. Pt takes at bedtime) 90 tablet 3  . montelukast (SINGULAIR) 10 MG tablet Take 1 tablet (10 mg total) by mouth at bedtime. 90 tablet 3  . Multiple Vitamin (MULTIVITAMIN WITH MINERALS) TABS tablet Take 2 tablets by mouth daily.     . ondansetron (ZOFRAN ODT) 8 MG disintegrating tablet Take 1 tablet (8 mg total) by mouth 2 (two) times daily as needed for nausea or vomiting. 10 tablet 0  . Oxcarbazepine (TRILEPTAL) 300 MG tablet Take 300 mg by mouth 2 (two) times daily. Pt takes 1 tablet at bedtime    . pantoprazole (PROTONIX) 40 MG tablet Take 40 mg by mouth 2 (two) times daily.     . polyethylene glycol (MIRALAX / GLYCOLAX) 17 g packet Take 17 g by mouth 2 (two) times daily as needed for mild constipation. 14 each 0  . prochlorperazine (COMPAZINE) 10 MG tablet Take 1 tablet (10 mg total) by mouth every 6 (six) hours as needed for nausea or vomiting. 30 tablet 1  . promethazine (PHENERGAN) 25 MG suppository Place 1 suppository (25 mg total) rectally every 6 (six) hours as needed for nausea or vomiting. 12 each 0  . simvastatin (ZOCOR) 40 MG tablet Take 1 tablet (40  mg  total) by mouth every evening. 90 tablet 1  . sucralfate (CARAFATE) 1 GM/10ML suspension Take 10 mLs (1 g total) by mouth 4 (four) times daily -  with meals and at bedtime. (Patient taking differently: Take 1 g by mouth 4 (four) times daily -  with meals and at bedtime. Pt takes as needed.) 420 mL 0  . furosemide (LASIX) 40 MG tablet Take 1 tablet (40 mg total) by mouth daily. 30 tablet 0  . potassium chloride (KLOR-CON) 10 MEQ tablet Take 1 tablet (10 mEq total) by mouth daily. With lasix (Patient not taking: Reported on 08/14/2020) 30 tablet 3  . promethazine (PHENERGAN) 25 MG/ML injection Inject 1 mL (25 mg total) into the muscle once for 1 dose. 1 mL 0  . zaleplon (SONATA) 10 MG capsule Take 10 mg by mouth at bedtime as needed for sleep.      No facility-administered medications prior to visit.     Allergies:   Toviaz [fesoterodine], Cephalexin, Hydrocodone, Entresto [sacubitril-valsartan], Macrodantin [nitrofurantoin], Tolterodine tartrate, Risperidone and related, Seroquel [quetiapine fumarate], and Sulfa antibiotics   Social History   Socioeconomic History  . Marital status: Married    Spouse name: Not on file  . Number of children: 3  . Years of education: Not on file  . Highest education level: Not on file  Occupational History  . Occupation: Merchandiser, retail: UNEMPLOYED  Tobacco Use  . Smoking status: Former Smoker    Packs/day: 1.50    Years: 40.00    Pack years: 60.00    Types: Cigarettes    Quit date: 04/29/2009    Years since quitting: 11.3  . Smokeless tobacco: Never Used  Vaping Use  . Vaping Use: Never used  Substance and Sexual Activity  . Alcohol use: Yes    Alcohol/week: 2.0 standard drinks    Types: 2 Shots of liquor per week    Comment: 2 shots/weekly  . Drug use: No  . Sexual activity: Not Currently    Comment: Hysterectomy  Other Topics Concern  . Not on file  Social History Narrative   Married   3 children   Social Determinants of Health    Financial Resource Strain: Low Risk   . Difficulty of Paying Living Expenses: Not hard at all  Food Insecurity: No Food Insecurity  . Worried About Charity fundraiser in the Last Year: Never true  . Ran Out of Food in the Last Year: Never true  Transportation Needs: No Transportation Needs  . Lack of Transportation (Medical): No  . Lack of Transportation (Non-Medical): No  Physical Activity: Inactive  . Days of Exercise per Week: 0 days  . Minutes of Exercise per Session: 0 min  Stress: Stress Concern Present  . Feeling of Stress : To some extent  Social Connections:   . Frequency of Communication with Friends and Family: Not on file  . Frequency of Social Gatherings with Friends and Family: Not on file  . Attends Religious Services: Not on file  . Active Member of Clubs or Organizations: Not on file  . Attends Archivist Meetings: Not on file  . Marital Status: Not on file     Family History:  The patient's family history includes Alcohol abuse in her father; Aneurysm (age of onset: 77) in her father; CAD in an other family member; Cancer in her brother and father; Diabetes in her brother and sister.   ROS General: Negative; No fevers, chills, or  night sweats;  HEENT: Negative; No changes in vision or hearing, sinus congestion, difficulty swallowing Pulmonary: Negative; No cough, wheezing, shortness of breath, hemoptysis Cardiovascular: Negative; No chest pain, presyncope, syncope, palpitations GI: Negative; No nausea, vomiting, diarrhea, or abdominal pain GU: Negative; No dysuria, hematuria, or difficulty voiding Musculoskeletal: Negative; no myalgias, joint pain, or weakness Hematologic/Oncology: Negative; no easy bruising, bleeding Endocrine: Negative; no heat/cold intolerance; no diabetes Neuro: Negative; no changes in balance, headaches Skin: Negative; No rashes or skin lesions Psychiatric: Negative; No behavioral problems, depression Sleep: Positive for  obstructive sleep apnea on BiPAP, no, bruxism, restless legs, hypnogognic hallucinations, no cataplexy Other comprehensive 14 point system review is negative.   PHYSICAL EXAM:   VS:  BP 115/69 (BP Location: Left Arm, Patient Position: Sitting)   Pulse 68   Ht _0  (1.626 m)   Wt (!) 371 lb (168.3 kg)   LMP  (LMP Unknown)   SpO2 98%   BMI 63.68 kg/m     Repeat blood pressure by me was 118/70  Wt Readings from Last 3 Encounters:  08/14/20 (!) 371 lb (168.3 kg)  08/10/20 (!) 362 lb (164.2 kg)  07/05/20 (!) 370 lb 3.2 oz (167.9 kg)    General: Alert, oriented, no distress.  Super morbid obesity with a BMI of 63.7 Skin: normal turgor, no rashes, warm and dry HEENT: Normocephalic, atraumatic. Pupils equal round and reactive to light; sclera anicteric; extraocular muscles intact;  Nose without nasal septal hypertrophy Mouth/Parynx benign; Mallinpatti scale 3/4 Neck: Thick neck no JVD, no carotid bruits; normal carotid upstroke Lungs: clear to ausculatation and percussion; no wheezing or rales Chest wall: without tenderness to palpitation Heart: PMI not displaced, RRR, s1 s2 normal, 1/6 systolic murmur, no diastolic murmur, no rubs, gallops, thrills, or heaves Abdomen: Central adiposity; soft, nontender; no hepatosplenomehaly, BS+; abdominal aorta nontender and not dilated by palpation. Back: no CVA tenderness Pulses 2+ Musculoskeletal: full range of motion, normal strength, no joint deformities Extremities: no clubbing cyanosis or edema, Homan's sign negative  Neurologic: grossly nonfocal; Cranial nerves grossly wnl Psychologic: Normal mood and affect   Studies/Labs Reviewed:   EKG:  EKG is  ordered today.  ECG (independently read by me): NSR at 68, LVH with QRS widening with repolarization, QTc 504  Recent Labs: BMP Latest Ref Rng & Units 06/15/2020 05/30/2020 05/22/2020  Glucose 70 - 99 mg/dL 165(H) 170(H) 91  BUN 6 - 23 mg/dL 19 9 26(H)  Creatinine 0.40 - 1.20 mg/dL 0.83 0.51  0.62  BUN/Creat Ratio 6 - 22 (calc) - - -  Sodium 135 - 145 mEq/L 139 135 133(L)  Potassium 3.5 - 5.1 mEq/L 3.3(L) 4.4 4.1  Chloride 96 - 112 mEq/L 99 96 87(L)  CO2 19 - 32 mEq/L 30 30 38(H)  Calcium 8.4 - 10.5 mg/dL 8.8 9.2 9.1     Hepatic Function Latest Ref Rng & Units 05/30/2020 05/21/2020 05/20/2020  Total Protein 6.0 - 8.3 g/dL 6.3 6.2(L) 5.9(L)  Albumin 3.5 - 5.2 g/dL 4.1 3.0(L) 2.7(L)  AST 0 - 37 U/L 21 41 44(H)  ALT 0 - 35 U/L 34 66(H) 62(H)  Alk Phosphatase 39 - 117 U/L 67 64 63  Total Bilirubin 0.2 - 1.2 mg/dL 0.6 0.7 0.5  Bilirubin, Direct 0.0 - 0.3 mg/dL - - -    CBC Latest Ref Rng & Units 05/30/2020 05/18/2020 05/17/2020  WBC 4.0 - 10.5 K/uL 8.3 5.5 8.8  Hemoglobin 12.0 - 15.0 g/dL 11.8(L) 11.0(L) 10.5(L)  Hematocrit 36 - 46 %  36.6 32.4(L) 32.3(L)  Platelets 150 - 400 K/uL 330.0 304 301   Lab Results  Component Value Date   MCV 85.3 05/30/2020   MCV 82.0 05/18/2020   MCV 83.0 05/17/2020   Lab Results  Component Value Date   TSH 0.873 05/13/2020   Lab Results  Component Value Date   HGBA1C 6.5 (A) 08/10/2020     BNP    Component Value Date/Time   BNP 139.5 (H) 05/16/2020 0715    ProBNP    Component Value Date/Time   PROBNP 285.0 (H) 05/30/2020 1028     Lipid Panel     Component Value Date/Time   CHOL 115 05/05/2020 0419   CHOL 133 06/18/2015 0000   TRIG 35 05/12/2020 2227   TRIG 62 06/18/2015 0000   HDL 44 05/05/2020 0419   CHOLHDL 2.6 05/05/2020 0419   VLDL 13 05/05/2020 0419   LDLCALC 58 05/05/2020 0419   LDLCALC 57 06/18/2015 0000   LDLDIRECT 48.0 03/13/2016 1025     RADIOLOGY: No results found.   Additional studies/ records that were reviewed today include:    Patient Name: Hazle, Ogburn Date: 04/15/2020 Gender: Female D.O.B: Oct 06, 1950 Age (years): 32 Referring Provider: Jory Sims NP Height (inches): 64 Interpreting Physician: Shelva Majestic MD, ABSM Weight (lbs): 375 RPSGT: Gwenyth Allegra BMI: 64 MRN:  [id number] Neck Size: 17.00  CLINICAL INFORMATION The patient is referred for a BiPAP titration to treat sleep apnea.  Date of NPSG: 01/23/2020: AHI 20.6/h; RDI 22.1/h; REM AHI 59.3/h; supine AHI 20.6/h; O2 nadir 67%.  SLEEP STUDY TECHNIQUE As per the AASM Manual for the Scoring of Sleep and Associated Events v2.3 (April 2016) with a hypopnea requiring 4% desaturations.  The channels recorded and monitored were frontal, central and occipital EEG, electrooculogram (EOG), submentalis EMG (chin), nasal and oral airflow, thoracic and abdominal wall motion, anterior tibialis EMG, snore microphone, electrocardiogram, and pulse oximetry. Bilevel positive airway pressure (BPAP) was initiated at the beginning of the study and titrated to treat sleep-disordered breathing.  MEDICATIONS albuterol (PROVENTIL HFA;VENTOLIN HFA) 108 (90 Base) MCG/ACT inhaler aspirin 81 MG EC tablet budesonide-formoterol (SYMBICORT) 160-4.5 MCG/ACT inhaler carvedilol (COREG) 6.25 MG tablet(Expired) celecoxib (CELEBREX) 200 MG capsule dicyclomine (BENTYL) 20 MG tablet docusate sodium (COLACE) 100 MG capsule doxycycline (VIBRA-TABS) 100 MG tablet fluconazole (DIFLUCAN) 200 MG tablet FLUoxetine (PROZAC) 40 MG capsule furosemide (LASIX) 20 MG tablet(Expired) lamoTRIgine (LAMICTAL) 200 MG tablet levothyroxine (SYNTHROID) 150 MCG tablet losartan (COZAAR) 50 MG tablet(Expired) metFORMIN (GLUCOPHAGE) 500 MG tablet montelukast (SINGULAIR) 10 MG tablet Multiple Vitamin (MULTIVITAMIN WITH MINERALS) TABS tablet ondansetron (ZOFRAN ODT) 8 MG disintegrating tablet Oxcarbazepine (TRILEPTAL) 300 MG tablet oxybutynin (DITROPAN-XL) 10 MG 24 hr tablet pantoprazole (PROTONIX) 40 MG tablet polyethylene glycol (MIRALAX / GLYCOLAX) 17 g packet prochlorperazine (COMPAZINE) 10 MG tablet promethazine (PHENERGAN) 25 MG suppository simvastatin (ZOCOR) 40 MG tablet sucralfate (CARAFATE) 1 GM/10ML suspension zaleplon (SONATA) 10 MG  capsule Medications self-administered by patient taken the night of the study : N/A  RESPIRATORY PARAMETERS Optimal IPAP Pressure (cm):24AHI at Optimal Pressure (/hr)0.8 Optimal EPAP Pressure (cm):20  Overall Minimal O2 (%):82.0Minimal O2 at Optimal Pressure (%):91.0  SLEEP ARCHITECTURE Start Time:10:21:20 PMStop Time:4:39:25 AMTotal Time (min):378.1Total Sleep Time (min):340 Sleep Latency (min):13.9Sleep Efficiency (%):89.9%REM Latency (min):99.0WASO (min):24.2 Stage N1 (%):2.5%Stage N2 (%):46.2%Stage N3 (%):0.0%Stage R (%):51.3 Supine (%):100.00Arousal Index (/hr):3.0  CARDIAC DATA The 2 lead EKG demonstrated sinus rhythm. The mean heart rate was 79.6 beats per minute. Other EKG findings include: None.  LEG MOVEMENT DATA The total Periodic  Limb Movements of Sleep (PLMS) were 0. The PLMS index was 0.0. A PLMS index of <15 is considered normal in adults.  IMPRESSIONS - CPAP was initiated at 5 cm and was titrated to 18 cm with transition to BiPAP titrated to optimal BiPAP pressure at 24/20 cm of water. - Central sleep apnea was not noted during this titration (CAI = 0.0/h). - Moderete oxygen desaturations to a nadir of 82.0% at 16 cm. - The patient snored with soft snoring volume. - No cardiac abnormalities were observed during this study. - Clinically significant periodic limb movements were not noted during this study. Arousals associated with PLMs were rare.  DIAGNOSIS - Obstructive Sleep Apnea (G47.33)  RECOMMENDATIONS - Recommend an initial trial of BiPAP therapy at 24/20 cm H2O with heated humidification. A Small size Fisher&Paykel Full Face Mask Simplus mask was used for the titration. - Effort should be made to optimize nasal and oropharyngeal patency. - Avoid alcohol, sedatives and other CNS depressants that may worsen  sleep apnea and disrupt normal sleep architecture. - Sleep hygiene should be reviewed to assess factors that may improve sleep quality. - Weight management and regular exercise should be initiated or continued. - Recommend a download in 30 days and sleep clinic evaluation after 4 weeks of therapy.   ASSESSMENT:    1. OSA (obstructive sleep apnea) on BiPAP   2. Essential hypertension   3. Non-ischemic cardiomyopathy ([place])   4. Morbid obesity with BMI of 60.0-69.9, adult [facility])     PLAN:  Amy Barnes is a 69 year old female who is followed by Dr. Percival Spanish for her cardiology care.  She has a history of a nonischemic cardiomyopathy with initial echo Doppler study in February 2021 showing an EF of 35 to 40% with apical wall motion abnormality.  Catheterization revealed normal coronary arteries.  She received therapy with Entresto, furosemide carvedilol.  Subsequently LV function evaluation in May showed normalization of LV function with EF at 60 to 65%.  She is morbidly obese with a BMI of 63.7.  She was found to have moderate overall sleep apnea with an AHI of 20.6/h but events were very severe during REM sleep with an AHI of 59.3/h and she had significant oxygen desaturation to a nadir of 67% and time spent less than 89% was 293 minutes.  She has now on BiPAP therapy.  Initially she did not tolerate the high pressure requirements leading to pressure reduction.  Over the past several months she has done very well and she is meeting compliance standards with usage at least 90% with average usage at essentially 8 hours per night.  Her 95th percentile pressure is 19.7/15.7 with maximum average pressure 20.8/68.8.  She believes she is sleeping well.  There are still has some very mild residual daytime sleepiness with Epworth scale today calculated at 11.  I had discussed with her the effects of sleep apnea on cardiovascular health and she feels much better since initiating treatment.  She has a ResMed air  fit F 20 mask and there is no significant leak.  She continues to be on carvedilol 6.25 mg twice a day, furosemide 40 mg daily, and is no longer on Entresto.  Her blood pressure today is well controlled.  Despite getting Covid, she has not been vaccinated.  She will follow up with Dr. Percival Spanish for cardiology care.  I will see her in 1 year for follow-up sleep evaluation sooner as needed.   Medication Adjustments/Labs and Tests Ordered: Current medicines are  reviewed at length with the patient today.  Concerns regarding medicines are outlined above.  Medication changes, Labs and Tests ordered today are listed in the Patient Instructions below. Patient Instructions  Medication Instructions:  Your physician recommends that you continue on your current medications as directed. Please refer to the Current Medication list given to you today.  *If you need a refill on your cardiac medications before your next appointment, please call your pharmacy*  Lab Work: NONE  Testing/Procedures: NONE  Follow-Up: At [facility], you and your health needs are our priority.  As part of our continuing mission to provide you with exceptional heart care, we have created designated Provider Care Teams.  These Care Teams include your primary Cardiologist (physician) and Advanced Practice Providers (APPs -  Physician Assistants and Nurse Practitioners) who all work together to provide you with the care you need, when you need it.  We recommend signing up for the patient portal called "MyChart".  Sign up information is provided on this After Visit Summary.  MyChart is used to connect with patients for Virtual Visits (Telemedicine).  Patients are able to view lab/test results, encounter notes, upcoming appointments, etc.  Non-urgent messages can be sent to your provider as well.   To learn more about what you can do with MyChart, go to [email or web address].    Your next appointment:   12 month(s)  The format for  your next appointment:   In Person  Provider:   You may see DR Claiborne Billings or one of the following Advanced Practice Providers on your designated Care Team:          Signed, Shelva Majestic, MD  08/19/2020 7:08 PM    [facility] [address] [place], [place], [place]  [address] Phone: [phone]

## 2020-08-14 NOTE — Patient Instructions (Signed)
Medication Instructions:  Your physician recommends that you continue on your current medications as directed. Please refer to the Current Medication list given to you today.  *If you need a refill on your cardiac medications before your next appointment, please call your pharmacy*  Lab Work: NONE  Testing/Procedures: NONE  Follow-Up: At [facility], you and your health needs are our priority.  As part of our continuing mission to provide you with exceptional heart care, we have created designated Provider Care Teams.  These Care Teams include your primary Cardiologist (physician) and Advanced Practice Providers (APPs -  Physician Assistants and Nurse Practitioners) who all work together to provide you with the care you need, when you need it.  We recommend signing up for the patient portal called "MyChart".  Sign up information is provided on this After Visit Summary.  MyChart is used to connect with patients for Virtual Visits (Telemedicine).  Patients are able to view lab/test results, encounter notes, upcoming appointments, etc.  Non-urgent messages can be sent to your provider as well.   To learn more about what you can do with MyChart, go to [email or web address].    Your next appointment:   12 month(s)  The format for your next appointment:   In Person  Provider:   You may see DR Tresa Endo or one of the following Advanced Practice Providers on your designated Care Team:

## 2020-08-19 ENCOUNTER — Encounter: Payer: Self-pay | Admitting: Cardiovascular Disease

## 2020-08-21 DIAGNOSIS — U071 COVID-19: Secondary | ICD-10-CM | POA: Diagnosis not present

## 2020-08-21 DIAGNOSIS — E1165 Type 2 diabetes mellitus with hyperglycemia: Secondary | ICD-10-CM | POA: Diagnosis not present

## 2020-08-21 DIAGNOSIS — M170 Bilateral primary osteoarthritis of knee: Secondary | ICD-10-CM | POA: Diagnosis not present

## 2020-08-21 DIAGNOSIS — J1282 Pneumonia due to coronavirus disease 2019: Secondary | ICD-10-CM | POA: Diagnosis not present

## 2020-08-21 DIAGNOSIS — J9601 Acute respiratory failure with hypoxia: Secondary | ICD-10-CM | POA: Diagnosis not present

## 2020-08-21 DIAGNOSIS — J432 Centrilobular emphysema: Secondary | ICD-10-CM | POA: Diagnosis not present

## 2020-08-22 ENCOUNTER — Other Ambulatory Visit: Payer: Self-pay | Admitting: *Deleted

## 2020-08-22 DIAGNOSIS — Z87891 Personal history of nicotine dependence: Secondary | ICD-10-CM

## 2020-08-22 DIAGNOSIS — Z122 Encounter for screening for malignant neoplasm of respiratory organs: Secondary | ICD-10-CM

## 2020-08-22 DIAGNOSIS — U071 COVID-19: Secondary | ICD-10-CM | POA: Diagnosis not present

## 2020-08-22 NOTE — Progress Notes (Signed)
Contacted and scheduled for lung screening scan. Patient is a former smoker, quit 2010 with a 45 pack year history.

## 2020-08-22 NOTE — Patient Outreach (Signed)
[facility]) Care Management  08/22/2020  Amy Barnes March 14, 1951 [id number]   Outgoing call placed to member, successful.  She report she is doing better, breathing and shortness has improved.  State she is still using the oxygen, but not as often.  Report compliance with CPAP however a piece of the mask has broken off.  She called the office today, will be able to pick up replacement piece.  Denies any urgent concerns, encouraged to continue to increase activity/endurace and breathing exercises.  Encouraged to contact this care manager with questions.  Will follow up within the next month.  Goals Addressed            This Visit's Progress   . THN - Track and Manage Activity and Exertion   On track    Follow Up Date 12/23   - drink water to stay hydrated during exercise - follow activity or exercise plan - track symptoms during activity in diary    Why is this important?   Exercising is very important when managing your heart failure.  It will help your heart get stronger.    Notes:   11/24 - Discussed use of oxygen with activity, dependence/need steadily decreasing    . THN - Track and Manage Symptoms   On track    Follow Up Date 12/15   - begin a heart failure diary - follow rescue plan if symptoms flare-up - know when to call the doctor - track symptoms and what helps feel better or worse    Why is this important?   You will be able to handle your symptoms better if you keep track of them.  Making some simple changes to your lifestyle will help.  Eating healthy is one thing you can do to take good care of yourself.    Notes:       Amy Durie, RN, MSN [facility] Care Management  [facility] Manager [phone]

## 2020-08-23 ENCOUNTER — Other Ambulatory Visit: Payer: Self-pay | Admitting: Family Medicine

## 2020-08-30 ENCOUNTER — Encounter: Payer: Self-pay | Admitting: Family Medicine

## 2020-09-13 ENCOUNTER — Ambulatory Visit
Admission: RE | Admit: 2020-09-13 | Discharge: 2020-09-13 | Disposition: A | Discharge status: Home / Self Care | Payer: Medicare Other | Source: Ambulatory Visit | Attending: Oncology | Admitting: Oncology

## 2020-09-13 ENCOUNTER — Other Ambulatory Visit: Payer: Self-pay

## 2020-09-13 ENCOUNTER — Other Ambulatory Visit: Payer: Self-pay | Admitting: Internal Medicine

## 2020-09-13 DIAGNOSIS — Z87891 Personal history of nicotine dependence: Secondary | ICD-10-CM

## 2020-09-13 DIAGNOSIS — J439 Emphysema, unspecified: Secondary | ICD-10-CM | POA: Diagnosis not present

## 2020-09-13 DIAGNOSIS — Z122 Encounter for screening for malignant neoplasm of respiratory organs: Secondary | ICD-10-CM | POA: Insufficient documentation

## 2020-09-17 ENCOUNTER — Encounter: Payer: Self-pay | Admitting: *Deleted

## 2020-09-18 DIAGNOSIS — M7138 Other bursal cyst, other site: Secondary | ICD-10-CM | POA: Diagnosis not present

## 2020-09-18 DIAGNOSIS — M4316 Spondylolisthesis, lumbar region: Secondary | ICD-10-CM | POA: Diagnosis not present

## 2020-09-19 ENCOUNTER — Other Ambulatory Visit: Payer: Self-pay | Admitting: *Deleted

## 2020-09-19 NOTE — Patient Outreach (Signed)
[facility]) Care Management  [facility] Care Manager  09/19/2020   Amy Barnes 09-01-1951 [id number]    Call placed to member to follow up on ongoing recovery from Covid and management of chronic medical conditions.  State she is still improving, has good days and bad days.  Still having some shortness of breath at times, may be related to chronic COPD and CHF.  Denies swelling, taking all medications, including inhalers and fluid pills, as instructed.  Not monitoring her weight daily, state she wait until her daughter is there.  She will try to do this daily even if daughter is not present, will have husband to help.  She will follow up with PCP on 1/17 as she will need recert for oxygen use.  Otherwise, follow up appointments with cardiology in October next year and with PCP in May.  Denies any urgent concerns, encouraged to contact this care manager with questions.   Encounter Medications:  Outpatient Encounter Medications as of 09/19/2020  Medication Sig  . albuterol (PROVENTIL HFA;VENTOLIN HFA) 108 (90 Base) MCG/ACT inhaler Inhale 2 puffs into the lungs every 6 (six) hours as needed for wheezing or shortness of breath.  Marland Kitchen aspirin 81 MG EC tablet Take 81 mg by mouth at bedtime.   . budesonide-formoterol (SYMBICORT) 160-4.5 MCG/ACT inhaler Inhale 2 puffs into the lungs 2 (two) times daily. (Patient taking differently: Inhale 2 puffs into the lungs 2 (two) times daily. Pt uses as needed.)  . carvedilol (COREG) 6.25 MG tablet Take 1 tablet (6.25 mg total) by mouth 2 (two) times daily.  Marland Kitchen dicyclomine (BENTYL) 20 MG tablet Take 1 tablet (20 mg total) by mouth in the morning and at bedtime. (Patient taking differently: Take 20 mg by mouth in the morning and at bedtime. As needed)  . doxycycline (VIBRA-TABS) 100 MG tablet Take 1 tablet (100 mg total) by mouth 2 (two) times daily.  Marland Kitchen FLUoxetine (PROZAC) 40 MG capsule Take 40 mg by mouth daily.   . furosemide (LASIX) 40 MG tablet Take 1  tablet (40 mg total) by mouth daily.  Marland Kitchen gabapentin (NEURONTIN) 100 MG capsule Take 1-2 capsules (100-200 mg total) by mouth at bedtime.  . lamoTRIgine (LAMICTAL) 200 MG tablet Take 200 mg by mouth daily.    Marland Kitchen levothyroxine (SYNTHROID) 150 MCG tablet TAKE ONE TABLET BY MOUTH AT BEDTIME (Patient taking differently: Take 150 mcg by mouth daily before breakfast. Pt takes at bedtime.)  . metFORMIN (GLUCOPHAGE) 500 MG tablet Take 1 tablet (500 mg total) by mouth daily with breakfast. (Patient taking differently: Take 500 mg by mouth daily with breakfast. Pt takes at bedtime)  . montelukast (SINGULAIR) 10 MG tablet Take 1 tablet (10 mg total) by mouth at bedtime.  . Multiple Vitamin (MULTIVITAMIN WITH MINERALS) TABS tablet Take 2 tablets by mouth daily.   . ondansetron (ZOFRAN ODT) 8 MG disintegrating tablet Take 1 tablet (8 mg total) by mouth 2 (two) times daily as needed for nausea or vomiting.  . Oxcarbazepine (TRILEPTAL) 300 MG tablet Take 300 mg by mouth 2 (two) times daily. Pt takes 1 tablet at bedtime  . pantoprazole (PROTONIX) 40 MG tablet TAKE ONE TABLET BY MOUTH TWICE A DAY  . polyethylene glycol (MIRALAX / GLYCOLAX) 17 g packet Take 17 g by mouth 2 (two) times daily as needed for mild constipation.  . potassium chloride (KLOR-CON) 10 MEQ tablet Take 1 tablet (10 mEq total) by mouth daily. With lasix (Patient not taking: Reported on 08/14/2020)  .  prochlorperazine (COMPAZINE) 10 MG tablet Take 1 tablet (10 mg total) by mouth every 6 (six) hours as needed for nausea or vomiting.  . promethazine (PHENERGAN) 25 MG suppository Place 1 suppository (25 mg total) rectally every 6 (six) hours as needed for nausea or vomiting.  . promethazine (PHENERGAN) 25 MG/ML injection Inject 1 mL (25 mg total) into the muscle once for 1 dose.  . simvastatin (ZOCOR) 40 MG tablet TAKE ONE TABLET BY MOUTH EVERY EVENING  . sucralfate (CARAFATE) 1 GM/10ML suspension Take 10 mLs (1 g total) by mouth 4 (four) times daily -   with meals and at bedtime. (Patient taking differently: Take 1 g by mouth 4 (four) times daily -  with meals and at bedtime. Pt takes as needed.)   No facility-administered encounter medications on file as of 09/19/2020.    Functional Status:  In your present state of health, do you have any difficulty performing the following activities: 05/14/2020 05/04/2020  Hearing? N N  Comment - -  Vision? N N  Difficulty concentrating or making decisions? N N  Walking or climbing stairs? Y Y  Comment - -  Dressing or bathing? Y Y  Comment - -  Doing errands, shopping? Y Y  Comment - -  Preparing Food and eating ? - -  Using the Toilet? - -  In the past six months, have you accidently leaked urine? - -  Comment - -  Do you have problems with loss of bowel control? - -  Managing your Medications? - -  Managing your Finances? - -  Housekeeping or managing your Housekeeping? - -  Comment - -  Some recent data might be hidden    Fall/Depression Screening: Fall Risk  07/19/2020 03/21/2020 01/20/2020  Falls in the past year? 0 1 1  Number falls in past yr: - 1 1  Injury with Fall? - 0 0  Risk for fall due to : No Fall Risks - Medication side effect;Impaired balance/gait  Follow up Falls evaluation completed - Falls evaluation completed;Falls prevention discussed   PHQ 2/9 Scores 07/19/2020 01/20/2020 11/15/2019 08/04/2019 04/20/2019 03/02/2019 01/12/2019  PHQ - 2 Score 0 2 1 1  0 1 0  PHQ- 9 Score - 2 - - - - 0    Assessment:  Goals Addressed            This Visit's Progress   . THN - Track and Manage Activity and Exertion   On track    Follow Up Date 10/18/2020  Timeframe:  Short-Term Goal Priority:  Medium Start Date:        09/19/2020                     Expected End Date:  10/20/2020                       - drink water to stay hydrated during exercise - follow activity or exercise plan - track symptoms during activity in diary    Why is this important?   Exercising is very  important when managing your heart failure.  It will help your heart get stronger.    Notes:   11/24 - Discussed use of oxygen with activity, dependence/need steadily decreasing    . THN - Track and Manage Symptoms   On track    Follow Up Date 10/18/2020  Timeframe:  Long-Range Goal Priority:  High Start Date:  09/19/2020                  Expected End Date:        11/20/2020            - begin a heart failure diary - follow rescue plan if symptoms flare-up - know when to call the doctor - track symptoms and what helps feel better or worse    Why is this important?   You will be able to handle your symptoms better if you keep track of them.  Making some simple changes to your lifestyle will help.  Eating healthy is one thing you can do to take good care of yourself.    Notes  12/22 - Encouraged to monitor BP and weights daily, recording readings for trends        Plan:  Follow-up:  Patient agrees to Care Plan and Follow-up.  Will follow up within the next month.  Kemper Durie, [place], MSN [facility] Care Management  [facility] Manager [phone]

## 2020-10-08 ENCOUNTER — Other Ambulatory Visit: Payer: Self-pay | Admitting: Family Medicine

## 2020-10-15 ENCOUNTER — Ambulatory Visit: Payer: Medicare Other | Admitting: Family Medicine

## 2020-10-18 ENCOUNTER — Other Ambulatory Visit: Payer: Self-pay | Admitting: *Deleted

## 2020-10-18 DIAGNOSIS — F3181 Bipolar II disorder: Secondary | ICD-10-CM | POA: Diagnosis not present

## 2020-10-18 NOTE — Patient Outreach (Signed)
[facility]) Care Management  10/18/2020  Amy Barnes [birth year] [id number]   Outgoing call placed to member, was able to speak with member and daughter Amy Barnes.  They both state member has been doing well, having some good and some bad days.  Still having some intermittent shortness of breath, able to decrease use of oxygen at times.  She was supposed to have appointment with PCP on 1/17 to re-qualify for oxygen, canceled due to weather.  Rescheduled for tomorrow.  All other chronic conditions reportedly stable.  Denies any urgent concerns, encouraged to contact this care manager with questions.  Agrees to follow up within the next month.  Goals Addressed            This Visit's Progress   . THN - Track and Manage Activity and Exertion   On track    Follow Up Date 11/14/2020  Timeframe:  Short-Term Goal Priority:  Medium Start Date:        09/19/2020                     Expected End Date:  10/20/2020                       - drink water to stay hydrated during exercise - follow activity or exercise plan - track symptoms during activity in diary    Why is this important?   Exercising is very important when managing your heart failure.  It will help your heart get stronger.    Notes:   11/24 - Discussed use of oxygen with activity, dependence/need steadily decreasing    . THN - Track and Manage Symptoms   On track    Follow Up Date 11/14/2020  Timeframe:  Long-Range Goal Priority:  High Start Date:           09/19/2020                  Expected End Date:        11/20/2020            - begin a heart failure diary - follow rescue plan if symptoms flare-up - know when to call the doctor - track symptoms and what helps feel better or worse    Why is this important?   You will be able to handle your symptoms better if you keep track of them.  Making some simple changes to your lifestyle will help.  Eating healthy is one thing you can do to take good care of  yourself.    Notes  12/22 - Encouraged to monitor BP and weights daily, recording readings for trends       Kemper Durie, Charity fundraiser, MSN [facility] Care Management  [facility] Manager [phone]

## 2020-10-19 ENCOUNTER — Encounter: Payer: Self-pay | Admitting: Family Medicine

## 2020-10-19 ENCOUNTER — Ambulatory Visit: Payer: Self-pay | Admitting: Urology

## 2020-10-19 ENCOUNTER — Telehealth (INDEPENDENT_AMBULATORY_CARE_PROVIDER_SITE_OTHER): Payer: Medicare Other | Admitting: Family Medicine

## 2020-10-19 VITALS — BP 128/59 | HR 71

## 2020-10-19 DIAGNOSIS — Z9981 Dependence on supplemental oxygen: Secondary | ICD-10-CM

## 2020-10-19 DIAGNOSIS — G4733 Obstructive sleep apnea (adult) (pediatric): Secondary | ICD-10-CM

## 2020-10-19 DIAGNOSIS — J9611 Chronic respiratory failure with hypoxia: Secondary | ICD-10-CM | POA: Diagnosis not present

## 2020-10-19 DIAGNOSIS — J432 Centrilobular emphysema: Secondary | ICD-10-CM | POA: Diagnosis not present

## 2020-10-19 DIAGNOSIS — I428 Other cardiomyopathies: Secondary | ICD-10-CM

## 2020-10-19 DIAGNOSIS — I5020 Unspecified systolic (congestive) heart failure: Secondary | ICD-10-CM | POA: Diagnosis not present

## 2020-10-19 DIAGNOSIS — M8949 Other hypertrophic osteoarthropathy, multiple sites: Secondary | ICD-10-CM

## 2020-10-19 DIAGNOSIS — I700 Atherosclerosis of aorta: Secondary | ICD-10-CM | POA: Diagnosis not present

## 2020-10-19 DIAGNOSIS — M150 Primary generalized (osteo)arthritis: Secondary | ICD-10-CM

## 2020-10-19 DIAGNOSIS — M159 Polyosteoarthritis, unspecified: Secondary | ICD-10-CM

## 2020-10-19 DIAGNOSIS — E662 Morbid (severe) obesity with alveolar hypoventilation: Secondary | ICD-10-CM | POA: Diagnosis not present

## 2020-10-19 NOTE — Assessment & Plan Note (Signed)
Ongoing pain worse at night affecting sleep.  Avoid NSAIDs including celebrex in CAD hx.  Pt/family hesitant for stronger medication.  Discussed tylenol/tramadol use - try individually first, may combine and if more effective to let me know for Rx for ultracet.

## 2020-10-19 NOTE — Progress Notes (Signed)
Patient ID: Amy Barnes, female    DOB: October 26, 1950, 70 y.o.   MRN: [id number]  Virtual visit completed through MyChart, a video enabled telemedicine application. Due to national recommendations of social distancing due to COVID-19, a virtual visit is felt to be most appropriate for this patient at this time. Reviewed limitations, risks, security and privacy concerns of performing a virtual visit and the availability of in person appointments. I also reviewed that there may be a patient responsible charge related to this service. The patient agreed to proceed.   Patient location: home Provider location: [place] [facility], office Persons participating in this virtual visit: patient, provider, daughter [name]  If any vitals were documented, they were collected by patient at home unless specified below.    BP (!) 128/59   Pulse 71   LMP  (LMP Unknown)   SpO2 (!) 89% Comment: w/o O2   CC: discuss oxygen need Subjective:   HPI: BRIONA KORPELA is a 70 y.o. female presenting on 10/19/2020 for needs Oxygen    Due for oxygen re-certification. Desaturations continue when off oxygen - down to below 89% mainly with ambulatory. Continues using O2 at 2L [place]. Stable period. They feel strength continues improving. She has been able to decrease oxygen use overall. Ambulatory <40 ft before she gets dyspneic.   H/o HFrEF 10/2019 followed by Dr Antoine Poche on entresto, lasix, carvedilol. EF improved on latest echocardiogram 01/2020.   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 Uses ResMed air fit F 20 mask. Notes increasing pain at night time affecting sleep - all joints. Tramadol and tylenol not effective.   Known COPD managed with symbicort only taking 2 puffs nightly. Not needing albuterol inhaler. Some dyspnea that improves with deep breathing ?anxiety related.   Recent lung cancer screening chest CT IMPRESSION: 1. Lung-RADS 2, benign appearance  or behavior. Continue annual screening with low-dose chest CT without contrast in 12 months. 2. Borderline mild cardiomegaly. 3. Chronic mosaic attenuation throughout both lungs, nonspecific, with differential including small airways disease or mosaic perfusion from pulmonary vascular disease. 4. Aortic Atherosclerosis (ICD10-I70.0) and Emphysema (ICD10-J43.9).      Relevant past medical, surgical, family and social history reviewed and updated as indicated. Interim medical history since our last visit reviewed. Allergies and medications reviewed and updated. Outpatient Medications Prior to Visit  Medication Sig Dispense Refill  . albuterol (PROVENTIL HFA;VENTOLIN HFA) 108 (90 Base) MCG/ACT inhaler Inhale 2 puffs into the lungs every 6 (six) hours as needed for wheezing or shortness of breath. 1 Inhaler 6  . aspirin 81 MG EC tablet Take 81 mg by mouth at bedtime.    . budesonide-formoterol (SYMBICORT) 160-4.5 MCG/ACT inhaler Inhale 2 puffs into the lungs 2 (two) times daily. (Patient taking differently: Inhale 2 puffs into the lungs 2 (two) times daily. Pt uses as needed.) 1 Inhaler 3  . carvedilol (COREG) 6.25 MG tablet Take 1 tablet (6.25 mg total) by mouth 2 (two) times daily. 180 tablet 3  . dicyclomine (BENTYL) 20 MG tablet Take 1 tablet (20 mg total) by mouth in the morning and at bedtime. (Patient taking differently: Take 20 mg by mouth in the morning and at bedtime. As needed) 60 tablet 2  . doxycycline (VIBRA-TABS) 100 MG tablet Take 1 tablet (100 mg total) by mouth 2 (two) times daily. 180 tablet 3  . FLUoxetine (PROZAC) 40 MG capsule Take 40 mg by mouth  daily.     . gabapentin (NEURONTIN) 100 MG capsule TAKE ONE TO TWO CAPSULES BY MOUTH AT BEDTIME 60 capsule 1  . lamoTRIgine (LAMICTAL) 200 MG tablet Take 200 mg by mouth daily.    Marland Kitchen levothyroxine (SYNTHROID) 150 MCG tablet TAKE ONE TABLET BY MOUTH AT BEDTIME (Patient taking differently: Take 150 mcg by mouth daily before breakfast. Pt  takes at bedtime.) 90 tablet 3  . metFORMIN (GLUCOPHAGE) 500 MG tablet Take 1 tablet (500 mg total) by mouth daily with breakfast. (Patient taking differently: Take 500 mg by mouth daily with breakfast. Pt takes at bedtime) 90 tablet 3  . montelukast (SINGULAIR) 10 MG tablet Take 1 tablet (10 mg total) by mouth at bedtime. 90 tablet 3  . Multiple Vitamin (MULTIVITAMIN WITH MINERALS) TABS tablet Take 2 tablets by mouth daily.     . ondansetron (ZOFRAN ODT) 8 MG disintegrating tablet Take 1 tablet (8 mg total) by mouth 2 (two) times daily as needed for nausea or vomiting. 10 tablet 0  . Oxcarbazepine (TRILEPTAL) 300 MG tablet Take 300 mg by mouth 2 (two) times daily. Pt takes 1 tablet at bedtime    . pantoprazole (PROTONIX) 40 MG tablet TAKE ONE TABLET BY MOUTH TWICE A DAY 180 tablet 3  . polyethylene glycol (MIRALAX / GLYCOLAX) 17 g packet Take 17 g by mouth 2 (two) times daily as needed for mild constipation. 14 each 0  . potassium chloride (KLOR-CON) 10 MEQ tablet Take 1 tablet (10 mEq total) by mouth daily. With lasix 30 tablet 3  . prochlorperazine (COMPAZINE) 10 MG tablet Take 1 tablet (10 mg total) by mouth every 6 (six) hours as needed for nausea or vomiting. 30 tablet 1  . promethazine (PHENERGAN) 25 MG suppository Place 1 suppository (25 mg total) rectally every 6 (six) hours as needed for nausea or vomiting. 12 each 0  . simvastatin (ZOCOR) 40 MG tablet TAKE ONE TABLET BY MOUTH EVERY EVENING 90 tablet 3  . sucralfate (CARAFATE) 1 GM/10ML suspension Take 10 mLs (1 g total) by mouth 4 (four) times daily -  with meals and at bedtime. (Patient taking differently: Take 1 g by mouth 4 (four) times daily -  with meals and at bedtime. Pt takes as needed.) 420 mL 0  . furosemide (LASIX) 40 MG tablet Take 1 tablet (40 mg total) by mouth daily. 30 tablet 0  . promethazine (PHENERGAN) 25 MG/ML injection Inject 1 mL (25 mg total) into the muscle once for 1 dose. 1 mL 0   No facility-administered  medications prior to visit.     Per HPI unless specifically indicated in ROS section below Review of Systems Objective:  BP (!) 128/59   Pulse 71   LMP  (LMP Unknown)   SpO2 (!) 89% Comment: w/o O2  Wt Readings from Last 3 Encounters:  09/13/20 (!) 362 lb (164.2 kg)  08/14/20 (!) 371 lb (168.3 kg)  08/10/20 (!) 362 lb (164.2 kg)       Physical exam: Gen: alert, NAD, not ill appearing Pulm: speaks in complete sentences without increased work of breathing Psych: normal mood, normal thought content      Results for orders placed or performed in visit on 08/10/20  POCT glycosylated hemoglobin (Hb A1C)  Result Value Ref Range   Hemoglobin A1C 6.5 (A) 4.0 - 5.6 %   HbA1c POC (<> result, manual entry)     HbA1c, POC (prediabetic range)     HbA1c, POC (controlled diabetic range)  Assessment & Plan:   Problem List Items Addressed This Visit    Thoracic aortic atherosclerosis (HCC)    Continue aspirin, statin.       Osteoarthritis    Ongoing pain worse at night affecting sleep.  Avoid NSAIDs including celebrex in CAD hx.  Pt/family hesitant for stronger medication.  Discussed tylenol/tramadol use - try individually first, may combine and if more effective to let me know for Rx for ultracet.       OSA treated with BiPAP    Appreciate cards care. Continue nightly BiPAP with O2.       Obesity hypoventilation syndrome (HCC)   Non-ischemic cardiomyopathy (HCC)   HFrEF (heart failure with reduced ejection fraction) (HCC)    Stable period on lasix, carvedilol, entresto.       Chronic respiratory failure with hypoxia, on home O2 therapy (HCC) - Primary    Anticipate multifactorial due to OHVS, severe OSA, COPD, CHF. Stable period on oxygen at night with daytime PRN exertion/desaturation. Continue this.       Centrilobular emphysema (HCC)    Noted on recent CT - continues symbicort but only using 2 puffs at night. Discussed BID use as needed. Not currently needing  albuterol inhaler.           No orders of the defined types were placed in this encounter.  No orders of the defined types were placed in this encounter.   I discussed the assessment and treatment plan with the patient. The patient was provided an opportunity to ask questions and all were answered. The patient agreed with the plan and demonstrated an understanding of the instructions. The patient was advised to call back or seek an in-person evaluation if the symptoms worsen or if the condition fails to improve as anticipated.  Follow up plan: No follow-ups on file.  [name], MD

## 2020-10-19 NOTE — Assessment & Plan Note (Signed)
Stable period on lasix, carvedilol, entresto.

## 2020-10-19 NOTE — Assessment & Plan Note (Signed)
Continue aspirin, statin.  

## 2020-10-19 NOTE — Assessment & Plan Note (Signed)
Anticipate multifactorial due to OHVS, severe OSA, COPD, CHF. Stable period on oxygen at night with daytime PRN exertion/desaturation. Continue this.

## 2020-10-19 NOTE — Assessment & Plan Note (Signed)
Appreciate cards care. Continue nightly BiPAP with O2.  °

## 2020-10-19 NOTE — Assessment & Plan Note (Signed)
Noted on recent CT - continues symbicort but only using 2 puffs at night. Discussed BID use as needed. Not currently needing albuterol inhaler.

## 2020-10-19 NOTE — Assessment & Plan Note (Deleted)
Appreciate cards care. Continue nightly BiPAP with O2.

## 2020-10-22 ENCOUNTER — Encounter: Payer: Self-pay | Admitting: Family Medicine

## 2020-11-10 ENCOUNTER — Other Ambulatory Visit: Payer: Self-pay | Admitting: Internal Medicine

## 2020-11-10 ENCOUNTER — Other Ambulatory Visit: Payer: Self-pay | Admitting: *Deleted

## 2020-11-10 DIAGNOSIS — B373 Candidiasis of vulva and vagina: Secondary | ICD-10-CM

## 2020-11-10 DIAGNOSIS — B3731 Acute candidiasis of vulva and vagina: Secondary | ICD-10-CM

## 2020-11-10 NOTE — Patient Outreach (Signed)
[facility]) Care Management  11/10/2020  Amy Barnes [birth year] [id number]   Patient now active with chronic care management, will close to [facility] community nursing.  Kemper Durie, [place], MSN [facility] Care Management  [facility] Manager [phone]

## 2020-11-12 ENCOUNTER — Ambulatory Visit (INDEPENDENT_AMBULATORY_CARE_PROVIDER_SITE_OTHER): Payer: Medicare Other

## 2020-11-12 DIAGNOSIS — I5020 Unspecified systolic (congestive) heart failure: Secondary | ICD-10-CM

## 2020-11-12 DIAGNOSIS — E1142 Type 2 diabetes mellitus with diabetic polyneuropathy: Secondary | ICD-10-CM | POA: Diagnosis not present

## 2020-11-12 NOTE — Patient Instructions (Addendum)
Visit Information  PATIENT GOALS:  Goals Addressed            This Visit's Progress   . Monitor and Manage My Blood Sugar-Diabetes Type 2       Timeframe:  Long-Range Goal Priority:  High Start Date:  11/12/2020                           Expected End Date: 02/25/2021                      Follow Up Date 12/12/2020    check blood sugar at least 2-3 times per week check blood sugar if I feel it is too high or too low take the blood sugar meter to all doctor visits  Take medication as prescribed.  Attends all scheduled provider appointments Checks blood sugars as prescribed and utilize hyper and hypoglycemia protocol as needed Follow prescribed ADA/carb modified   Why is this important?    Checking your blood sugar at home helps to keep it from getting very high or very low.   Writing the results in a diary or log helps the doctor know how to care for you.   Your blood sugar log should have the time, date and the results.   Also, write down the amount of insulin or other medicine that you take.   Other information, like what you ate, exercise done and how you were feeling, will also be helpful.     Notes:     . Track and Manage Fluids and Swelling-Heart Failure       Timeframe:  Long-Range Goal Priority:  High Start Date:  11/12/2020                           Expected End Date: 5/50/2022                    Follow Up Date 12/12/2020   - call office if I gain more than 3 pounds in one day or 5 pounds in one week - do ankle pumps when sitting - keep legs up while sitting - use salt in moderation - watch for swelling in feet, ankles and legs every day - weigh myself daily and record weights.      Why is this important?    It is important to check your weight daily and watch how much salt and liquids you have.   It will help you to manage your heart failure.    Notes:      Heart Failure Action Plan A heart failure action plan helps you understand what to do  when you have symptoms of heart failure. Your action plan is a color-coded plan that lists the symptoms to watch for and indicates what actions to take.  If you have symptoms in the red zone, you need medical care right away.  If you have symptoms in the yellow zone, you are having problems.  If you have symptoms in the  zone, you are doing well. Follow the plan that was created by you and your health care provider. Review your plan each time you visit your health care provider. Red zone These signs and symptoms mean you should get medical help right away:  You have trouble breathing when resting.  You have a dry cough that is getting worse.  You have swelling or pain in  your legs or abdomen that is getting worse.  You suddenly gain more than 2-3 lb (0.9-1.4 kg) in 24 hours, or more than 5 lb (2.3 kg) in a week. This amount may be more or less depending on your condition.  You have trouble staying awake or you feel confused.  You have chest pain.  You do not have an appetite.  You pass out.  You have worsening sadness or depression. If you have any of these symptoms, call your local emergency services (911 in the [place] right away. Do not drive yourself to the hospital.   Yellow zone These signs and symptoms mean your condition may be getting worse and you should make some changes:  You have trouble breathing when you are active, or you need to sleep with your head raised on extra pillows to help you breathe.  You have swelling in your legs or abdomen.  You gain 2-3 lb (0.9-1.4 kg) in 24 hours, or 5 lb (2.3 kg) in a week. This amount may be more or less depending on your condition.  You get tired easily.  You have trouble sleeping.  You have a dry cough. If you have any of these symptoms:  Contact your health care provider within the next day.  Your health care provider may adjust your medicines.   Amy Barnes zone These signs mean you are doing well and can continue  what you are doing:  You do not have shortness of breath.  You have very little swelling or no new swelling.  Your weight is stable (no gain or loss).  You have a normal activity level.  You do not have chest pain or any other new symptoms.   Follow these instructions at home:  Take over-the-counter and prescription medicines only as told by your health care provider.  Weigh yourself daily. Your target weight is __________ lb (__________ kg). ? Call your health care provider if you gain more than __________ lb (__________ kg) in 24 hours, or more than __________ lb (__________ kg) in a week. ? Health care provider name: _____________________________________________________ ? Health care provider phone number: _____________________________________________________  Eat a heart-healthy diet. Work with a diet and nutrition specialist (dietitian) to create an eating plan that is best for you.  Keep all follow-up visits. This is important. Where to find more information  American Heart Association: www.heart.org Summary  A heart failure action plan helps you understand what to do when you have symptoms of heart failure.  Follow the action plan that was created by you and your health care provider.  Get help right away if you have any symptoms in the red zone. This information is not intended to replace advice given to you by your health care provider. Make sure you discuss any questions you have with your health care provider. Document Revised: 04/30/2020 Document Reviewed: 04/30/2020 Elsevier Patient Education  2021 Amy Barnes.  Diabetes Mellitus and Nutrition, Adult When you have diabetes, or diabetes mellitus, it is very important to have healthy eating habits because your blood sugar (glucose) levels are greatly affected by what you eat and drink. Eating healthy foods in the right amounts, at about the same times every day, can help you:  Control your blood glucose.  Lower  your risk of heart disease.  Improve your blood pressure.  Reach or maintain a healthy weight. What can affect my meal plan? Every person with diabetes is different, and each person has different needs for a meal plan. Your health  care provider may recommend that you work with a dietitian to make a meal plan that is best for you. Your meal plan may vary depending on factors such as:  The calories you need.  The medicines you take.  Your weight.  Your blood glucose, blood pressure, and cholesterol levels.  Your activity level.  Other health conditions you have, such as heart or kidney disease. How do carbohydrates affect me? Carbohydrates, also called carbs, affect your blood glucose level more than any other type of food. Eating carbs naturally raises the amount of glucose in your blood. Carb counting is a method for keeping track of how many carbs you eat. Counting carbs is important to keep your blood glucose at a healthy level, especially if you use insulin or take certain oral diabetes medicines. It is important to know how many carbs you can safely have in each meal. This is different for every person. Your dietitian can help you calculate how many carbs you should have at each meal and for each snack. How does alcohol affect me? Alcohol can cause a sudden decrease in blood glucose (hypoglycemia), especially if you use insulin or take certain oral diabetes medicines. Hypoglycemia can be a life-threatening condition. Symptoms of hypoglycemia, such as sleepiness, dizziness, and confusion, are similar to symptoms of having too much alcohol.  Do not drink alcohol if: ? Your health care provider tells you not to drink. ? You are pregnant, may be pregnant, or are planning to become pregnant.  If you drink alcohol: ? Do not drink on an empty stomach. ? Limit how much you use to:  0-1 drink a day for women.  0-2 drinks a day for men. ? Be aware of how much alcohol is in your drink.  In the [place] one drink equals one 12 oz bottle of beer (355 mL), one 5 oz glass of wine (148 mL), or one 1 oz glass of hard liquor (44 mL). ? Keep yourself hydrated with water, diet soda, or unsweetened iced tea.  Keep in mind that regular soda, juice, and other mixers may contain a lot of sugar and must be counted as carbs. What are tips for following this plan? Reading food labels  Start by checking the serving size on the "Nutrition Facts" label of packaged foods and drinks. The amount of calories, carbs, fats, and other nutrients listed on the label is based on one serving of the item. Many items contain more than one serving per package.  Check the total grams (g) of carbs in one serving. You can calculate the number of servings of carbs in one serving by dividing the total carbs by 15. For example, if a food has 30 g of total carbs per serving, it would be equal to 2 servings of carbs.  Check the number of grams (g) of saturated fats and trans fats in one serving. Choose foods that have a low amount or none of these fats.  Check the number of milligrams (mg) of salt (sodium) in one serving. Most people should limit total sodium intake to less than 2,300 mg per day.  Always check the nutrition information of foods labeled as "low-fat" or "nonfat." These foods may be higher in added sugar or refined carbs and should be avoided.  Talk to your dietitian to identify your daily goals for nutrients listed on the label. Shopping  Avoid buying canned, pre-made, or processed foods. These foods tend to be high in fat, sodium, and added sugar.  Shop around the outside edge of the grocery store. This is where you will most often find fresh fruits and vegetables, bulk grains, fresh meats, and fresh dairy. Cooking  Use low-heat cooking methods, such as baking, instead of high-heat cooking methods like deep frying.  Cook using healthy oils, such as olive, canola, or sunflower oil.  Avoid cooking  with butter, cream, or high-fat meats. Meal planning  Eat meals and snacks regularly, preferably at the same times every day. Avoid going long periods of time without eating.  Eat foods that are high in fiber, such as fresh fruits, vegetables, beans, and whole grains. Talk with your dietitian about how many servings of carbs you can eat at each meal.  Eat 4-6 oz (112-168 g) of lean protein each day, such as lean meat, chicken, fish, eggs, or tofu. One ounce (oz) of lean protein is equal to: ? 1 oz (28 g) of meat, chicken, or fish. ? 1 egg. ?  cup (62 g) of tofu.  Eat some foods each day that contain healthy fats, such as avocado, nuts, seeds, and fish.   What foods should I eat? Fruits Berries. Apples. Oranges. Peaches. Apricots. Plums. Grapes. Mango. Papaya. Pomegranate. Kiwi. Cherries. Vegetables Lettuce. Spinach. Leafy greens, including kale, chard, collard greens, and mustard greens. Beets. Cauliflower. Cabbage. Broccoli. Carrots.  beans. Tomatoes. Peppers. Onions. Cucumbers. Brussels sprouts. Grains Whole grains, such as whole-wheat or whole-grain bread, crackers, tortillas, cereal, and pasta. Unsweetened oatmeal. Quinoa. Brown or wild rice. Meats and other proteins Seafood. Poultry without skin. Lean cuts of poultry and beef. Tofu. Nuts. Seeds. Dairy Low-fat or fat-free dairy products such as milk, yogurt, and cheese. The items listed above may not be a complete list of foods and beverages you can eat. Contact a dietitian for more information. What foods should I avoid? Fruits Fruits canned with syrup. Vegetables Canned vegetables. Frozen vegetables with butter or cream sauce. Grains Refined [place] flour and flour products such as bread, pasta, snack foods, and cereals. Avoid all processed foods. Meats and other proteins Fatty cuts of meat. Poultry with skin. Breaded or fried meats. Processed meat. Avoid saturated fats. Dairy Full-fat yogurt, cheese, or  milk. Beverages Sweetened drinks, such as soda or iced tea. The items listed above may not be a complete list of foods and beverages you should avoid. Contact a dietitian for more information. Questions to ask a health care provider  Do I need to meet with a diabetes educator?  Do I need to meet with a dietitian?  What number can I call if I have questions?  When are the best times to check my blood glucose? Where to find more information:  American Diabetes Association: diabetes.org  Academy of Nutrition and Dietetics: www.eatright.[employer] of Diabetes and Digestive and Kidney Diseases: [employer]  Association of Diabetes Care and Education Specialists: www.diabeteseducator.org Summary  It is important to have healthy eating habits because your blood sugar (glucose) levels are greatly affected by what you eat and drink.  A healthy meal plan will help you control your blood glucose and maintain a healthy lifestyle.  Your health care provider may recommend that you work with a dietitian to make a meal plan that is best for you.  Keep in mind that carbohydrates (carbs) and alcohol have immediate effects on your blood glucose levels. It is important to count carbs and to use alcohol carefully. This information is not intended to replace advice given to you by your health care provider. Make  sure you discuss any questions you have with your health care provider. Document Revised: 08/23/2019 Document Reviewed: 08/23/2019 Elsevier Patient Education  2021 [place].   Consent to CCM Services: Amy Barnes was given information about Chronic Care Management services today including:  1. CCM service includes personalized support from designated clinical staff supervised by her physician, including individualized plan of care and coordination with other care providers 2. 24/7 contact phone numbers for assistance for urgent and routine care needs. 3. Service will  only be billed when office clinical staff spend 20 minutes or more in a month to coordinate care. 4. Only one practitioner may furnish and bill the service in a calendar month. 5. The patient may stop CCM services at any time (effective at the end of the month) by phone call to the office staff. 6. The patient will be responsible for cost sharing (co-pay) of up to 20% of the service fee (after annual deductible is met).  Patient agreed to services and verbal consent obtained.   Patient verbalizes understanding of instructions provided today and agrees to view in [place].   The patient has been provided with contact information for the care management team and has been advised to call with any health related questions or concerns.  The care management team will reach out to the patient again over the next 30 days.   [name] RN,BSN,CCM RN Case Manager [facility]  [phone]  CLINICAL CARE PLAN: Patient Care Plan: Heart Failure (Adult)  Problem Identified: Disease Progression (Heart Failure)   Priority: High  Onset Date: 11/12/2020  Goal: patient will verbalize heart failure home managment strategies   Start Date: 11/12/2020  Expected End Date: 12/12/2020  This Visit's Progress: On track  Priority: High  Current Barriers:  Marland Kitchen Knowledge deficit related to basic heart failure pathophysiology and self care management . Cognitive Deficits: Patient reports having difficulty with focus/ concentration . Unable to independently : Patient reports she is unable to manage her medications independently. She states her daughter manages her pill box weekly.  . Unable to perform IADLs independently: Patient reports she is unable to drive or manage her household independently. She reports having family support and assistance.  Case Manager Clinical Goal(s):  . patient will verbalize understanding of Heart Failure Action Plan and when to call doctor . patient will take all Heart Failure  mediations as prescribed . patient will weigh daily and record (notifying MD of 3 lb weight gain over night or 5 lb in a week) Interventions:  . Collaboration with Ria Bush, MD regarding development and update of comprehensive plan of care as evidenced by provider attestation and co-signature . Inter-disciplinary care team collaboration (see longitudinal plan of care) . Provided verbal education on low sodium diet . Reviewed Heart Failure Action Plan in depth and provided written copy: Heart failure action plan / zones sent to patient.  . Discussed importance of daily weight and advised patient to weigh and record daily . Reviewed role of diuretics in prevention of fluid overload and management of heart failure Patient Goals/Self-Care Activities . call office if I gain more than 3 pounds in one day or 5 pounds in one week . do ankle pumps when sitting . keep legs up while sitting . use salt in moderation . watch for swelling in feet, ankles and legs every day . weigh myself daily and record weights.   Follow Up Plan: The patient has been provided with contact information for the care management team  and has been advised to call with any health related questions or concerns.  The care management team will reach out to the patient again over the next 30 days.    Patient Care Plan: Diabetes Type 2 (Adult)  Problem Identified: Glycemic Management (Diabetes, Type 2)   Goal: Patient will verbalize home management strategies for diabetes   Start Date: 11/12/2020  Expected End Date: 02/10/2021  This Visit's Progress: On track  Priority: High  Objective:  Lab Results  Component Value Date   HGBA1C 6.5 (A) 08/10/2020 .   Lab Results  Component Value Date   CREATININE 0.83 06/15/2020   CREATININE 0.51 05/30/2020   CREATININE 0.62 05/22/2020   . No results found for: EGFR Current Barriers:  Marland Kitchen Knowledge Deficits related to basic Diabetes self care/management Case Manager Clinical  Goal(s):  . patient will demonstrate improved adherence to prescribed treatment plan for diabetes self care/management as evidenced by: Monitoring and recording blood sugars at least 2-3 times per week, following the ADA/ carb modified diet, Taking her medication as prescribed, contacting provider for new or worsened symptoms or questions Interventions:  . Collaboration with Ria Bush, MD regarding development and update of comprehensive plan of care as evidenced by provider attestation and co-signature . Inter-disciplinary care team collaboration (see longitudinal plan of care) . Reviewed medications with patient and discussed importance of medication adherence . Discussed plans with patient for ongoing care management follow up and provided patient with direct contact information for care management team . Provided patient with written educational materials related to hypo and hyperglycemia and importance of correct treatment Patient Goals/Self-Care Activities . Patient will:  check blood sugar at least 2-3 times per week check blood sugar if I feel it is too high or too low take the blood sugar meter to all doctor visits  Take medication as prescribed.  Attends all scheduled provider appointments Checks blood sugars as prescribed and utilize hyper and hypoglycemia protocol as needed Follow prescribed ADA/carb modified Follow Up Plan: The patient has been provided with contact information for the care management team and has been advised to call with any health related questions or concerns.  The care management team will reach out to the patient again over the next 30 days.

## 2020-11-12 NOTE — Chronic Care Management (AMB) (Signed)
Chronic Care Management   CCM RN Visit Note  11/12/2020 Name: Amy Barnes MRN: [id number] DOB: 08-29-1951  Subjective: Amy Barnes is a 70 y.o. year old female who is a primary care patient of Ria Bush, MD. The care management team was consulted for assistance with disease management and care coordination needs.    Engaged with patient by telephone for initial visit in response to provider referral for case management and/or care coordination services.   Consent to Services:  The patient was given the following information about Chronic Care Management services today, agreed to services, and gave verbal consent: 1. CCM service includes personalized support from designated clinical staff supervised by the primary care provider, including individualized plan of care and coordination with other care providers 2. 24/7 contact phone numbers for assistance for urgent and routine care needs. 3. Service will only be billed when office clinical staff spend 20 minutes or more in a month to coordinate care. 4. Only one practitioner may furnish and bill the service in a calendar month. 5.The patient may stop CCM services at any time (effective at the end of the month) by phone call to the office staff. 6. The patient will be responsible for cost sharing (co-pay) of up to 20% of the service fee (after annual deductible is met). Patient agreed to services and consent obtained.  Patient agreed to services and verbal consent obtained.   Assessment: Review of patient past medical history, allergies, medications, health status, including review of consultants reports, laboratory and other test data, was performed as part of comprehensive evaluation and provision of chronic care management services.   SDOH (Social Determinants of Health) assessments and interventions performed:  SDOH Interventions   Flowsheet Row Most Recent Value  SDOH Interventions   Depression Interventions/Treatment  Medication,  Counseling, Currently on Treatment       CCM Care Plan  Allergies  Allergen Reactions  . Lisbeth Ply [Fesoterodine] Nausea And Vomiting    Severe reaction - s/p several ER visits then hospitalization ?stress induced cardiomyopathy  . Cephalexin Hives  . Hydrocodone Other (See Comments)    Reaction:  Hallucinations   . Entresto [Sacubitril-Valsartan]   . Macrodantin [Nitrofurantoin]     Dyspnea, lips peeling  . Tolterodine Tartrate Nausea And Vomiting  . Risperidone And Related Other (See Comments)    Reaction:  Made pt excessively sleepy  . Seroquel [Quetiapine Fumarate] Other (See Comments)    Reaction:  Made pt excessively sleepy  . Sulfa Antibiotics Rash    Tolerated bactrim course well 06/2020    Outpatient Encounter Medications as of 11/12/2020  Medication Sig  . albuterol (PROVENTIL HFA;VENTOLIN HFA) 108 (90 Base) MCG/ACT inhaler Inhale 2 puffs into the lungs every 6 (six) hours as needed for wheezing or shortness of breath.  Marland Kitchen aspirin 81 MG EC tablet Take 81 mg by mouth at bedtime.  . budesonide-formoterol (SYMBICORT) 160-4.5 MCG/ACT inhaler Inhale 2 puffs into the lungs 2 (two) times daily. (Patient taking differently: Inhale 2 puffs into the lungs 2 (two) times daily. Pt uses as needed.)  . carvedilol (COREG) 6.25 MG tablet Take 1 tablet (6.25 mg total) by mouth 2 (two) times daily.  Marland Kitchen doxycycline (VIBRA-TABS) 100 MG tablet Take 1 tablet (100 mg total) by mouth 2 (two) times daily.  . fluconazole (DIFLUCAN) 200 MG tablet TAKE ONE TABLET BY MOUTH DAILY FOR 10 DAYS  . levothyroxine (SYNTHROID) 150 MCG tablet TAKE ONE TABLET BY MOUTH AT BEDTIME (Patient taking differently: Take 150 mcg  by mouth daily before breakfast. Pt takes at bedtime.)  . metFORMIN (GLUCOPHAGE) 500 MG tablet Take 1 tablet (500 mg total) by mouth daily with breakfast. (Patient taking differently: Take 500 mg by mouth daily with breakfast. Pt takes at bedtime)  . montelukast (SINGULAIR) 10 MG tablet Take 1 tablet  (10 mg total) by mouth at bedtime.  . Multiple Vitamin (MULTIVITAMIN WITH MINERALS) TABS tablet Take 2 tablets by mouth daily.   . ondansetron (ZOFRAN ODT) 8 MG disintegrating tablet Take 1 tablet (8 mg total) by mouth 2 (two) times daily as needed for nausea or vomiting.  . polyethylene glycol (MIRALAX / GLYCOLAX) 17 g packet Take 17 g by mouth 2 (two) times daily as needed for mild constipation.  . potassium chloride (KLOR-CON) 10 MEQ tablet Take 1 tablet (10 mEq total) by mouth daily. With lasix  . prochlorperazine (COMPAZINE) 10 MG tablet Take 1 tablet (10 mg total) by mouth every 6 (six) hours as needed for nausea or vomiting.  . promethazine (PHENERGAN) 25 MG suppository Place 1 suppository (25 mg total) rectally every 6 (six) hours as needed for nausea or vomiting.  . simvastatin (ZOCOR) 40 MG tablet TAKE ONE TABLET BY MOUTH EVERY EVENING  . sucralfate (CARAFATE) 1 GM/10ML suspension Take 10 mLs (1 g total) by mouth 4 (four) times daily -  with meals and at bedtime. (Patient taking differently: Take 1 g by mouth 4 (four) times daily -  with meals and at bedtime. Pt takes as needed.)  . dicyclomine (BENTYL) 20 MG tablet Take 1 tablet (20 mg total) by mouth in the morning and at bedtime. (Patient taking differently: Take 20 mg by mouth in the morning and at bedtime. As needed)  . FLUoxetine (PROZAC) 40 MG capsule Take 40 mg by mouth daily.   . furosemide (LASIX) 40 MG tablet Take 1 tablet (40 mg total) by mouth daily.  Marland Kitchen gabapentin (NEURONTIN) 100 MG capsule TAKE ONE TO TWO CAPSULES BY MOUTH AT BEDTIME  . lamoTRIgine (LAMICTAL) 200 MG tablet Take 200 mg by mouth daily.  . Oxcarbazepine (TRILEPTAL) 300 MG tablet Take 300 mg by mouth 2 (two) times daily. Pt takes 1 tablet at bedtime  . pantoprazole (PROTONIX) 40 MG tablet TAKE ONE TABLET BY MOUTH TWICE A DAY  . promethazine (PHENERGAN) 25 MG/ML injection Inject 1 mL (25 mg total) into the muscle once for 1 dose.   No facility-administered  encounter medications on file as of 11/12/2020.    Patient Active Problem List   Diagnosis Date Noted  . DNR (do not resuscitate) 05/31/2020  . Chronic respiratory failure with hypoxia, on home O2 therapy ([place]) 05/04/2020  . Obesity hypoventilation syndrome ([place]) 05/04/2020  . LVH (left ventricular hypertrophy) 05/04/2020  . Pneumonia due to COVID-19 virus 05/03/2020  . Non-ischemic cardiomyopathy ([place]) 12/29/2019  . HFrEF (heart failure with reduced ejection fraction) ([place]) 11/25/2019  . UTI (urinary tract infection) 11/25/2019  . NSTEMI (non-ST elevated myocardial infarction) ([place]) 11/09/2019  . QT prolongation 11/09/2019  . Hyponatremia 11/03/2019  . Diffuse abdominal pain 11/03/2019  . Nausea and vomiting 11/03/2019  . Diminished pulses in lower extremity 08/11/2019  . Osteopenia 08/11/2019  . Wound infection complicating hardware ([place]) 04/01/2019  . 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 Neurogenic urinary incontinence 11/29/2018  . Urinary incontinence 11/29/2018  . Bladder spasms 11/29/2018  . Discogenic low back pain 11/29/2018  . Chronic sacroiliac joint pain (Bilateral) (R>L) 11/29/2018  . Spondylosis without myelopathy or radiculopathy, lumbar  region 11/09/2018  . Chronic low back pain (Bilateral) (L>R) w/o sciatica 11/09/2018  . History of hip replacement (Left) 10/13/2018  . History of back surgery 10/13/2018  . Vitamin D insufficiency 10/13/2018  . Osteoarthritis of facet joint of lumbar spine 10/13/2018  . Osteoarthritis involving multiple joints 10/13/2018  . Cervical radiculopathy (Left) 10/13/2018  . Lumbar facet arthropathy (Bilateral) 10/13/2018  . Lumbar facet syndrome (Bilateral) (L>R) 10/13/2018  . Abnormal MRI, lumbar spine (09/16/2018) 10/13/2018  . Lumbar nerve root compression (L4) (Left) 10/13/2018  . Lumbar foraminal stenosis 10/13/2018  . 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 Failed back  surgical syndrome 10/12/2018  . Chronic low back pain (Primary Area of Pain) (Bilateral) (L>R) w/ sciatica (Left) 09/14/2018  . Chronic lower extremity pain (Secondary Area of Pain) (Left) 09/14/2018  . Chronic hip pain [facility] Area of Pain) (Bilateral) (L>R) 09/14/2018  . Chronic knee pain (Fourth Area of Pain) (Bilateral) (R>L) 09/14/2018  . Polypharmacy 09/14/2018  . Disorder of skeletal system 09/14/2018  . Problems influencing health status 09/14/2018  . Primary osteoarthritis of first carpometacarpal joint of left hand 04/20/2018  . 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 01/16/2018  . PAH (pulmonary artery hypertension) ([place]) 08/03/2017  . Advanced care planning/counseling discussion 06/25/2017  . Synovial cyst of lumbar facet joint 11/13/2016  . Thoracic aortic atherosclerosis ([place])   . CAD (coronary artery disease) 07/30/2016  . Centrilobular emphysema ([place]) 07/30/2016  . Ex-smoker 07/08/2016  . Nonspecific abnormal electrocardiogram (ECG) (EKG) 07/08/2016  . Chronic lumbar radicular pain (L4) (Bilateral) 06/09/2016  . DOE (dyspnea on exertion) 05/22/2016  . Degenerative joint disease (DJD) of hip 04/17/2016  . Somatic dysfunction of sacroiliac joint 04/17/2016  . S/P Lumbar Fusion (L3-4 PLIF) 04/17/2016  . Cervical fusion syndrome 04/17/2016  . Pedal edema 02/19/2016  . Chronic pain syndrome 02/19/2016  . Abdominal aortic atherosclerosis ([place]) 12/11/2015  . MRSA (methicillin resistant Staphylococcus aureus) infection 10/11/2012  . Urinary urgency 07/28/2012  . Infection or inflammatory reaction due to internal joint prosthesis ([place]) 12/03/2011  . Morbid obesity with BMI of 60.0-69.9, adult ([place]) 05/13/2011  . Essential hypertension 05/17/2010  . Cervical radiculopathy (Right) 12/25/2009  . Benign positional vertigo 07/21/2008  . Hyperlipidemia associated with type 2 diabetes mellitus ([place]) 08/16/2007  . Hypothyroidism 07/30/2007  . DM type 2 with diabetic peripheral  neuropathy ([place]) 07/30/2007  . Normocytic anemia 07/30/2007  . Bipolar disorder ([place]) 07/30/2007  . Allergic rhinitis 07/30/2007  . Asthma 07/30/2007  . GERD 07/30/2007  . Osteoarthritis 07/30/2007  . OSA treated with BiPAP 07/30/2007    Conditions to be addressed/monitored:CHF and DMII  Care Plan : Heart Failure (Adult)  Updates made by Dannielle Karvonen, RN since 11/12/2020 12:00 AM  Problem: Disease Progression (Heart Failure)   Priority: High  Onset Date: 11/12/2020  Goal: patient will verbalize heart failure home managment strategies   Start Date: 11/12/2020  Expected End Date: 12/12/2020  This Visit's Progress: On track  Priority: High  Current Barriers:  Marland Kitchen Knowledge deficit related to basic heart failure pathophysiology and self care management . Cognitive Deficits: Patient reports having difficulty with focus/ concentration . Unable to independently : Patient reports she is unable to manage her medications independently. She states her daughter manages her pill box weekly.  . Unable to perform IADLs independently: Patient reports she is unable to drive or manage her household independently. She reports having family support and assistance.  Case Manager Clinical Goal(s):  . patient will  verbalize understanding of Heart Failure Action Plan and when to call doctor . patient will take all Heart Failure mediations as prescribed . patient will weigh daily and record (notifying MD of 3 lb weight gain over night or 5 lb in a week) Interventions:  . Collaboration with [name], MD regarding development and update of comprehensive plan of care as evidenced by provider attestation and co-signature . Inter-disciplinary care team collaboration (see longitudinal plan of care) . Provided verbal education on low sodium diet . Reviewed Heart Failure Action Plan in depth and provided written copy: Heart failure action plan / zones sent to patient.  . Discussed importance of daily weight  and advised patient to weigh and record daily . Reviewed role of diuretics in prevention of fluid overload and management of heart failure Patient Goals/Self-Care Activities . call office if I gain more than 3 pounds in one day or 5 pounds in one week . do ankle pumps when sitting . keep legs up while sitting . use salt in moderation . watch for swelling in feet, ankles and legs every day . weigh myself daily and record weights.   Follow Up Plan: The patient has been provided with contact information for the care management team and has been advised to call with any health related questions or concerns.  The care management team will reach out to the patient again over the next 30 days.    Care Plan : Diabetes Type 2 (Adult)  Updates made by Otho Ket, RN since 11/12/2020 12:00 AM  Problem: Glycemic Management (Diabetes, Type 2)   Goal: Patient will verbalize home management strategies for diabetes   Start Date: 11/12/2020  Expected End Date: 02/10/2021  This Visit's Progress: On track  Priority: High  Objective:  Lab Results  Component Value Date   HGBA1C 6.5 (A) 08/10/2020 .   Lab Results  Component Value Date   CREATININE 0.83 06/15/2020   CREATININE 0.51 05/30/2020   CREATININE 0.62 05/22/2020 .   Marland Kitchen No results found for: EGFR Current Barriers:  Marland Kitchen Knowledge Deficits related to basic Diabetes self care/management Case Manager Clinical Goal(s):  . patient will demonstrate improved adherence to prescribed treatment plan for diabetes self care/management as evidenced by: Monitoring and recording blood sugars at least 2-3 times per week, following the ADA/ carb modified diet, Taking her medication as prescribed, contacting provider for new or worsened symptoms or questions Interventions:  . Collaboration with [name], MD regarding development and update of comprehensive plan of care as evidenced by provider attestation and co-signature . Inter-disciplinary care team  collaboration (see longitudinal plan of care) . Reviewed medications with patient and discussed importance of medication adherence . Discussed plans with patient for ongoing care management follow up and provided patient with direct contact information for care management team . Provided patient with written educational materials related to hypo and hyperglycemia and importance of correct treatment Patient Goals/Self-Care Activities . Patient will:  check blood sugar at least 2-3 times per week check blood sugar if I feel it is too high or too low take the blood sugar meter to all doctor visits  Take medication as prescribed.  Attends all scheduled provider appointments Checks blood sugars as prescribed and utilize hyper and hypoglycemia protocol as needed Follow prescribed ADA/carb modified Follow Up Plan: The patient has been provided with contact information for the care management team and has been advised to call with any health related questions or concerns.  The care management team  will reach out to the patient again over the next 30 days.     Plan:The patient has been provided with contact information for the care management team and has been advised to call with any health related questions or concerns.  and The care management team will reach out to the patient again over the next 30 days. [name] RN,BSN,CCM RN Case Manager [place]  [phone]

## 2020-11-14 ENCOUNTER — Ambulatory Visit: Payer: 59 | Admitting: *Deleted

## 2020-11-20 DIAGNOSIS — F3181 Bipolar II disorder: Secondary | ICD-10-CM | POA: Diagnosis not present

## 2020-11-23 ENCOUNTER — Encounter: Payer: Self-pay | Admitting: Internal Medicine

## 2020-11-23 ENCOUNTER — Other Ambulatory Visit: Payer: Self-pay

## 2020-11-23 ENCOUNTER — Ambulatory Visit (INDEPENDENT_AMBULATORY_CARE_PROVIDER_SITE_OTHER): Payer: Medicare Other | Admitting: Internal Medicine

## 2020-11-23 VITALS — BP 137/72 | HR 74 | Resp 16 | Ht 64.0 in | Wt 362.0 lb

## 2020-11-23 DIAGNOSIS — B373 Candidiasis of vulva and vagina: Secondary | ICD-10-CM

## 2020-11-23 DIAGNOSIS — M4626 Osteomyelitis of vertebra, lumbar region: Secondary | ICD-10-CM | POA: Diagnosis not present

## 2020-11-23 DIAGNOSIS — A4902 Methicillin resistant Staphylococcus aureus infection, unspecified site: Secondary | ICD-10-CM | POA: Diagnosis not present

## 2020-11-23 DIAGNOSIS — B372 Candidiasis of skin and nail: Secondary | ICD-10-CM | POA: Diagnosis not present

## 2020-11-23 DIAGNOSIS — B3731 Acute candidiasis of vulva and vagina: Secondary | ICD-10-CM

## 2020-11-23 MED ORDER — FLUCONAZOLE 200 MG PO TABS
200.0000 mg | ORAL_TABLET | Freq: Once | ORAL | 1 refills | Status: AC
Start: 2020-11-23 — End: 2020-11-23

## 2020-11-23 NOTE — Patient Instructions (Signed)
Hold simvastatin for 1 week while taking fluconazole

## 2020-11-23 NOTE — Progress Notes (Signed)
Patient ID: Amy Barnes, female   DOB: November 22, 1950, 70 y.o.   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 In the past we did decrease her doxy dose plus a short course of fluconazole that appeared to give her relief. She is currently using antifungal power/cream without much improvement.   Still has mobility issue due to back pain and weight  Outpatient Encounter Medications as of 11/23/2020  Medication Sig  . albuterol (PROVENTIL HFA;VENTOLIN HFA) 108 (90 Base) MCG/ACT inhaler Inhale 2 puffs into the lungs every 6 (six) hours as needed for wheezing or shortness of breath.  11/25/2020 aspirin 81 MG EC tablet Take 81 mg by mouth at bedtime.  . budesonide-formoterol (SYMBICORT) 160-4.5 MCG/ACT inhaler Inhale 2 puffs into the lungs 2 (two) times daily. (Patient taking differently: Inhale 2 puffs into the lungs 2 (two) times daily. Pt uses as needed.)  . carvedilol (COREG) 6.25 MG tablet Take 1 tablet (6.25 mg total) by mouth 2 (two) times daily.  Marland Kitchen dicyclomine (BENTYL) 20 MG tablet Take 1 tablet (20 mg total) by mouth in the morning and at bedtime. (Patient taking differently: Take 20 mg by mouth in the morning and at bedtime. As needed)  . doxycycline (VIBRA-TABS) 100 MG tablet Take 1 tablet (100 mg total) by mouth 2 (two) times daily.  . fluconazole (DIFLUCAN) 200 MG tablet TAKE ONE TABLET BY MOUTH DAILY FOR 10 DAYS  . FLUoxetine (PROZAC) 40 MG capsule Take 40 mg by mouth daily.   Marland Kitchen gabapentin (NEURONTIN) 100 MG capsule TAKE ONE TO TWO CAPSULES BY MOUTH AT BEDTIME  . lamoTRIgine (LAMICTAL) 200 MG tablet Take 400 mg by mouth daily.  Marland Kitchen levothyroxine (SYNTHROID) 150 MCG tablet TAKE ONE TABLET BY MOUTH AT BEDTIME (Patient taking differently: Take 150 mcg by mouth daily before breakfast. Pt takes at bedtime.)  . metFORMIN (GLUCOPHAGE) 500 MG tablet Take 1 tablet (500 mg total) by  mouth daily with breakfast. (Patient taking differently: Take 500 mg by mouth daily with breakfast. Pt takes at bedtime)  . montelukast (SINGULAIR) 10 MG tablet Take 1 tablet (10 mg total) by mouth at bedtime.  . Multiple Vitamin (MULTIVITAMIN WITH MINERALS) TABS tablet Take 2 tablets by mouth daily.   . ondansetron (ZOFRAN ODT) 8 MG disintegrating tablet Take 1 tablet (8 mg total) by mouth 2 (two) times daily as needed for nausea or vomiting.  . Oxcarbazepine (TRILEPTAL) 300 MG tablet Take 300 mg by mouth 2 (two) times daily. Pt takes 1 tablet at bedtime  . pantoprazole (PROTONIX) 40 MG tablet TAKE ONE TABLET BY MOUTH TWICE A DAY  . polyethylene glycol (MIRALAX / GLYCOLAX) 17 g packet Take 17 g by mouth 2 (two) times daily as needed for mild constipation.  . potassium chloride (KLOR-CON) 10 MEQ tablet Take 1 tablet (10 mEq total) by mouth daily. With lasix  . prochlorperazine (COMPAZINE) 10 MG tablet Take 1 tablet (10 mg total) by mouth every 6 (six) hours as needed for nausea or vomiting.  . promethazine (PHENERGAN) 25 MG suppository Place 1 suppository (25 mg total) rectally every 6 (six) hours as needed for nausea or vomiting.  . simvastatin (ZOCOR) 40 MG tablet TAKE ONE TABLET BY MOUTH EVERY EVENING  . sucralfate (CARAFATE) 1 GM/10ML suspension Take 10 mLs (1 g total) by mouth 4 (four) times daily -  with meals and at bedtime. (Patient taking  differently: Take 1 g by mouth 4 (four) times daily -  with meals and at bedtime. Pt takes as needed.)  . furosemide (LASIX) 40 MG tablet Take 1 tablet (40 mg total) by mouth daily.  . [DISCONTINUED] promethazine (PHENERGAN) 25 MG/ML injection Inject 1 mL (25 mg total) into the muscle once for 1 dose.   No facility-administered encounter medications on file as of 11/23/2020.     Patient Active Problem List   Diagnosis Date Noted  . DNR (do not resuscitate) 05/31/2020  . Chronic respiratory failure with hypoxia, on home O2 therapy (HCC) 05/04/2020  .  Obesity hypoventilation syndrome (HCC) 05/04/2020  . LVH (left ventricular hypertrophy) 05/04/2020  . Pneumonia due to COVID-19 virus 05/03/2020  . Non-ischemic cardiomyopathy (HCC) 12/29/2019  . HFrEF (heart failure with reduced ejection fraction) (HCC) 11/25/2019  . UTI (urinary tract infection) 11/25/2019  . NSTEMI (non-ST elevated myocardial infarction) (HCC) 11/09/2019  . QT prolongation 11/09/2019  . Hyponatremia 11/03/2019  . Diffuse abdominal pain 11/03/2019  . Nausea and vomiting 11/03/2019  . Diminished pulses in lower extremity 08/11/2019  . Osteopenia 08/11/2019  . Wound infection complicating hardware (HCC) 04/01/2019  . 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 Neurogenic urinary incontinence 11/29/2018  . Urinary incontinence 11/29/2018  . Bladder spasms 11/29/2018  . Discogenic low back pain 11/29/2018  . Chronic sacroiliac joint pain (Bilateral) (R>L) 11/29/2018  . Spondylosis without myelopathy or radiculopathy, lumbar region 11/09/2018  . Chronic low back pain (Bilateral) (L>R) w/o sciatica 11/09/2018  . History of hip replacement (Left) 10/13/2018  . History of back surgery 10/13/2018  . Vitamin D insufficiency 10/13/2018  . Osteoarthritis of facet joint of lumbar spine 10/13/2018  . Osteoarthritis involving multiple joints 10/13/2018  . Cervical radiculopathy (Left) 10/13/2018  . Lumbar facet arthropathy (Bilateral) 10/13/2018  . Lumbar facet syndrome (Bilateral) (L>R) 10/13/2018  . Abnormal MRI, lumbar spine (09/16/2018) 10/13/2018  . Lumbar nerve root compression (L4) (Left) 10/13/2018  . Lumbar foraminal stenosis 10/13/2018  . 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 Chronic low back pain (Primary Area of Pain) (Bilateral) (L>R) w/ sciatica (Left) 09/14/2018  . Chronic lower extremity pain (Secondary Area of Pain) (Left) 09/14/2018  . Chronic hip pain [facility]  Area of Pain) (Bilateral) (L>R) 09/14/2018  . Chronic knee pain (Fourth Area of Pain) (Bilateral) (R>L) 09/14/2018  . Polypharmacy 09/14/2018  . Disorder of skeletal system 09/14/2018  . Problems influencing health status 09/14/2018  . Primary osteoarthritis of first carpometacarpal joint of left hand 04/20/2018  . 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 01/16/2018  . PAH (pulmonary artery hypertension) (HCC) 08/03/2017  . Advanced care planning/counseling discussion 06/25/2017  . Synovial cyst of lumbar facet joint 11/13/2016  . Thoracic aortic atherosclerosis (HCC)   . CAD (coronary artery disease) 07/30/2016  . Centrilobular emphysema (HCC) 07/30/2016  . Ex-smoker 07/08/2016  . Nonspecific abnormal electrocardiogram (ECG) (EKG) 07/08/2016  . Chronic lumbar radicular pain (L4) (Bilateral) 06/09/2016  . DOE (dyspnea on exertion) 05/22/2016  . Degenerative joint disease (DJD) of hip 04/17/2016  . Somatic dysfunction of sacroiliac joint 04/17/2016  . S/P Lumbar Fusion (L3-4 PLIF) 04/17/2016  . Cervical fusion syndrome 04/17/2016  . Pedal edema 02/19/2016  . Chronic pain syndrome 02/19/2016  . Abdominal aortic atherosclerosis (HCC) 12/11/2015  . MRSA (methicillin resistant Staphylococcus aureus) infection 10/11/2012  . Urinary urgency 07/28/2012  . Infection or inflammatory reaction due to internal joint prosthesis (HCC)  12/03/2011  . Morbid obesity with BMI of 60.0-69.9, adult (HCC) 05/13/2011  . Essential hypertension 05/17/2010  . Cervical radiculopathy (Right) 12/25/2009  . Benign positional vertigo 07/21/2008  . Hyperlipidemia associated with type 2 diabetes mellitus (HCC) 08/16/2007  . Hypothyroidism 07/30/2007  . DM type 2 with diabetic peripheral neuropathy (HCC) 07/30/2007  . Normocytic anemia 07/30/2007  . Bipolar disorder (HCC) 07/30/2007  . Allergic rhinitis 07/30/2007  . Asthma 07/30/2007  . GERD 07/30/2007  . Osteoarthritis 07/30/2007  . OSA treated with BiPAP  07/30/2007     Health Maintenance Due  Topic Date Due  . COVID-19 Vaccine (1) Never done  . TETANUS/TDAP  06/29/2017  . MAMMOGRAM  08/09/2020  . OPHTHALMOLOGY EXAM  09/08/2020     Review of Systems 12 point ros is negative except what is mentioned above Physical Exam   BP 137/72   Pulse 74   Resp 16   Ht 5\' 4"  (1.626 m)   Wt (!) 362 lb (164.2 kg)   LMP  (LMP Unknown)   SpO2 97%   BMI 62.14 kg/m    No exam CBC Lab Results  Component Value Date   WBC 8.3 05/30/2020   RBC 4.29 05/30/2020   HGB 11.8 (L) 05/30/2020   HCT 36.6 05/30/2020   PLT 330.0 05/30/2020   MCV 85.3 05/30/2020   MCH 27.8 05/18/2020   MCHC 32.3 05/30/2020   RDW 17.1 (H) 05/30/2020   LYMPHSABS 0.9 05/30/2020   MONOABS 1.0 05/30/2020   EOSABS 0.0 05/30/2020    BMET Lab Results  Component Value Date   NA 139 06/15/2020   K 3.3 (L) 06/15/2020   CL 99 06/15/2020   CO2 30 06/15/2020   GLUCOSE 165 (H) 06/15/2020   BUN 19 06/15/2020   CREATININE 0.83 06/15/2020   CALCIUM 8.8 06/15/2020   GFRNONAA >60 05/22/2020   GFRAA >60 05/22/2020      Assessment and Plan   Vaginal and cutaneous yeast infection = will do trial of fluconazole 200mg  daily x 7 days, then back to prn  mrsa hw infection = chronically on doxy 100mg  bid, will drop down to 100mg  daily for short course to see if any impact on candidal skin infection

## 2020-11-24 LAB — BASIC METABOLIC PANEL
BUN: 15 mg/dL (ref 7–25)
CO2: 29 mmol/L (ref 20–32)
Calcium: 9.6 mg/dL (ref 8.6–10.4)
Chloride: 104 mmol/L (ref 98–110)
Creat: 0.7 mg/dL (ref 0.50–0.99)
Glucose, Bld: 130 mg/dL — ABNORMAL HIGH (ref 65–99)
Potassium: 4.4 mmol/L (ref 3.5–5.3)
Sodium: 143 mmol/L (ref 135–146)

## 2020-11-24 LAB — CBC WITH DIFFERENTIAL/PLATELET
Absolute Monocytes: 598 cells/uL (ref 200–950)
Basophils Absolute: 27 cells/uL (ref 0–200)
Basophils Relative: 0.4 %
Eosinophils Absolute: 252 cells/uL (ref 15–500)
Eosinophils Relative: 3.7 %
HCT: 33.9 % — ABNORMAL LOW (ref 35.0–45.0)
Hemoglobin: 11.1 g/dL — ABNORMAL LOW (ref 11.7–15.5)
Lymphs Abs: 1537 cells/uL (ref 850–3900)
MCH: 28.5 pg (ref 27.0–33.0)
MCHC: 32.7 g/dL (ref 32.0–36.0)
MCV: 87.1 fL (ref 80.0–100.0)
MPV: 10.8 fL (ref 7.5–12.5)
Monocytes Relative: 8.8 %
Neutro Abs: 4386 cells/uL (ref 1500–7800)
Neutrophils Relative %: 64.5 %
Platelets: 267 10*3/uL (ref 140–400)
RBC: 3.89 10*6/uL (ref 3.80–5.10)
RDW: 14.3 % (ref 11.0–15.0)
Total Lymphocyte: 22.6 %
WBC: 6.8 10*3/uL (ref 3.8–10.8)

## 2020-11-24 LAB — C-REACTIVE PROTEIN: CRP: 9.2 mg/L — ABNORMAL HIGH (ref <8.0)

## 2020-11-24 LAB — SEDIMENTATION RATE: Sed Rate: 31 mm/h — ABNORMAL HIGH (ref 0–30)

## 2020-12-11 ENCOUNTER — Ambulatory Visit (INDEPENDENT_AMBULATORY_CARE_PROVIDER_SITE_OTHER): Payer: Medicare Other

## 2020-12-11 VITALS — Wt 362.0 lb

## 2020-12-11 DIAGNOSIS — I5020 Unspecified systolic (congestive) heart failure: Secondary | ICD-10-CM | POA: Diagnosis not present

## 2020-12-11 DIAGNOSIS — E1142 Type 2 diabetes mellitus with diabetic polyneuropathy: Secondary | ICD-10-CM | POA: Diagnosis not present

## 2020-12-11 NOTE — Chronic Care Management (AMB) (Signed)
Chronic Care Management   CCM RN Visit Note  12/12/2020 Name: Amy Barnes MRN: [id number] DOB: May 18, 1951  Subjective: Amy Barnes is a 70 y.o. year old female who is a primary care patient of [name], MD. The care management team was consulted for assistance with disease management and care coordination needs.    Engaged with patient by telephone for follow up visit in response to provider referral for case management and/or care coordination services.   Consent to Services:  The patient was given information about Chronic Care Management services, agreed to services, and gave verbal consent prior to initiation of services.  Please see initial visit note for detailed documentation.   Patient agreed to services and verbal consent obtained.   Assessment: Review of patient past medical history, allergies, medications, health status, including review of consultants reports, laboratory and other test data, was performed as part of comprehensive evaluation and provision of chronic care management services.   SDOH (Social Determinants of Health) assessments and interventions performed:  SDOH Interventions   Flowsheet Row Most Recent Value  SDOH Interventions   Food Insecurity Interventions Intervention Not Indicated  Financial Strain Interventions Intervention Not Indicated  Housing Interventions Intervention Not Indicated  Intimate Partner Violence Interventions Intervention Not Indicated  Physical Activity Interventions Patient Refused  [Patient states she is not able to walk well. Uses walker/ wheelchair and scooter for ambulation due to knee issues.]  Social Connections Interventions Intervention Not Indicated       CCM Care Plan  Allergies  Allergen Reactions  . Gala Murdoch [Fesoterodine] Nausea And Vomiting    Severe reaction - s/p several ER visits then hospitalization ?stress induced cardiomyopathy  . Cephalexin Hives  . Hydrocodone Other (See Comments)    Reaction:   Hallucinations   . Entresto [Sacubitril-Valsartan]   . Macrodantin [Nitrofurantoin]     Dyspnea, lips peeling  . Tolterodine Tartrate Nausea And Vomiting  . Risperidone And Related Other (See Comments)    Reaction:  Made pt excessively sleepy  . Seroquel [Quetiapine Fumarate] Other (See Comments)    Reaction:  Made pt excessively sleepy  . Sulfa Antibiotics Rash    Tolerated bactrim course well 06/2020    Outpatient Encounter Medications as of 12/11/2020  Medication Sig Note  . albuterol (PROVENTIL HFA;VENTOLIN HFA) 108 (90 Base) MCG/ACT inhaler Inhale 2 puffs into the lungs every 6 (six) hours as needed for wheezing or shortness of breath.   Marland Kitchen aspirin 81 MG EC tablet Take 81 mg by mouth at bedtime.   . budesonide-formoterol (SYMBICORT) 160-4.5 MCG/ACT inhaler Inhale 2 puffs into the lungs 2 (two) times daily. (Patient taking differently: Inhale 2 puffs into the lungs 2 (two) times daily. Pt uses as needed.)   . carvedilol (COREG) 6.25 MG tablet Take 1 tablet (6.25 mg total) by mouth 2 (two) times daily.   Marland Kitchen dicyclomine (BENTYL) 20 MG tablet Take 1 tablet (20 mg total) by mouth in the morning and at bedtime. (Patient taking differently: Take 20 mg by mouth in the morning and at bedtime. As needed)   . doxycycline (VIBRA-TABS) 100 MG tablet Take 1 tablet (100 mg total) by mouth 2 (two) times daily.   . fluconazole (DIFLUCAN) 200 MG tablet Take 200 mg by mouth daily. Daughter states patient is taking 1 in am   . FLUoxetine (PROZAC) 40 MG capsule Take 40 mg by mouth daily.    Marland Kitchen gabapentin (NEURONTIN) 100 MG capsule TAKE ONE TO TWO CAPSULES BY MOUTH AT BEDTIME   .  lamoTRIgine (LAMICTAL) 200 MG tablet Take 400 mg by mouth daily.   Marland Kitchen levothyroxine (SYNTHROID) 150 MCG tablet TAKE ONE TABLET BY MOUTH AT BEDTIME (Patient taking differently: Take 150 mcg by mouth daily before breakfast. Pt takes at bedtime.) 12/11/2020: Daughter states patient takes 1 at bedtime  . metFORMIN (GLUCOPHAGE) 500 MG tablet  Take 1 tablet (500 mg total) by mouth daily with breakfast. (Patient taking differently: Take 500 mg by mouth daily with breakfast. Pt takes at bedtime)   . montelukast (SINGULAIR) 10 MG tablet Take 1 tablet (10 mg total) by mouth at bedtime.   . Multiple Vitamin (MULTIVITAMIN WITH MINERALS) TABS tablet Take 2 tablets by mouth daily.    . ondansetron (ZOFRAN ODT) 8 MG disintegrating tablet Take 1 tablet (8 mg total) by mouth 2 (two) times daily as needed for nausea or vomiting.   . pantoprazole (PROTONIX) 40 MG tablet TAKE ONE TABLET BY MOUTH TWICE A DAY   . polyethylene glycol (MIRALAX / GLYCOLAX) 17 g packet Take 17 g by mouth 2 (two) times daily as needed for mild constipation.   . prochlorperazine (COMPAZINE) 10 MG tablet Take 1 tablet (10 mg total) by mouth every 6 (six) hours as needed for nausea or vomiting.   . promethazine (PHENERGAN) 25 MG suppository Place 1 suppository (25 mg total) rectally every 6 (six) hours as needed for nausea or vomiting.   . simvastatin (ZOCOR) 40 MG tablet TAKE ONE TABLET BY MOUTH EVERY EVENING   . sucralfate (CARAFATE) 1 GM/10ML suspension Take 10 mLs (1 g total) by mouth 4 (four) times daily -  with meals and at bedtime. (Patient taking differently: Take 1 g by mouth 4 (four) times daily -  with meals and at bedtime. Pt takes as needed.)   . furosemide (LASIX) 40 MG tablet Take 1 tablet (40 mg total) by mouth daily.   . Oxcarbazepine (TRILEPTAL) 300 MG tablet Take 300 mg by mouth 2 (two) times daily. Pt takes 1 tablet at bedtime (Patient not taking: Reported on 12/11/2020)   . potassium chloride (KLOR-CON) 10 MEQ tablet Take 1 tablet (10 mEq total) by mouth daily. With lasix (Patient not taking: Reported on 12/11/2020)    No facility-administered encounter medications on file as of 12/11/2020.    Patient Active Problem List   Diagnosis Date Noted  . DNR (do not resuscitate) 05/31/2020  . Chronic respiratory failure with hypoxia, on home O2 therapy (HCC)  05/04/2020  . Obesity hypoventilation syndrome (HCC) 05/04/2020  . LVH (left ventricular hypertrophy) 05/04/2020  . Pneumonia due to COVID-19 virus 05/03/2020  . Non-ischemic cardiomyopathy (HCC) 12/29/2019  . HFrEF (heart failure with reduced ejection fraction) (HCC) 11/25/2019  . UTI (urinary tract infection) 11/25/2019  . NSTEMI (non-ST elevated myocardial infarction) (HCC) 11/09/2019  . QT prolongation 11/09/2019  . Hyponatremia 11/03/2019  . Diffuse abdominal pain 11/03/2019  . Nausea and vomiting 11/03/2019  . Diminished pulses in lower extremity 08/11/2019  . Osteopenia 08/11/2019  . Wound infection complicating hardware (HCC) 04/01/2019  . 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 Neurogenic urinary incontinence 11/29/2018  . Urinary incontinence 11/29/2018  . Bladder spasms 11/29/2018  . Discogenic low back pain 11/29/2018  . Chronic sacroiliac joint pain (Bilateral) (R>L) 11/29/2018  . Spondylosis without myelopathy or radiculopathy, lumbar region 11/09/2018  . Chronic low back pain (Bilateral) (L>R) w/o sciatica 11/09/2018  . History of hip replacement (Left) 10/13/2018  . History of back surgery 10/13/2018  . Vitamin D insufficiency 10/13/2018  .  Osteoarthritis of facet joint of lumbar spine 10/13/2018  . Osteoarthritis involving multiple joints 10/13/2018  . Cervical radiculopathy (Left) 10/13/2018  . Lumbar facet arthropathy (Bilateral) 10/13/2018  . Lumbar facet syndrome (Bilateral) (L>R) 10/13/2018  . Abnormal MRI, lumbar spine (09/16/2018) 10/13/2018  . Lumbar nerve root compression (L4) (Left) 10/13/2018  . Lumbar foraminal stenosis 10/13/2018  . 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 Chronic low back pain (Primary Area of Pain) (Bilateral) (L>R) w/ sciatica (Left) 09/14/2018  . Chronic lower extremity pain (Secondary Area of Pain) (Left) 09/14/2018  . Chronic hip  pain [facility] Area of Pain) (Bilateral) (L>R) 09/14/2018  . Chronic knee pain (Fourth Area of Pain) (Bilateral) (R>L) 09/14/2018  . Polypharmacy 09/14/2018  . Disorder of skeletal system 09/14/2018  . Problems influencing health status 09/14/2018  . Primary osteoarthritis of first carpometacarpal joint of left hand 04/20/2018  . 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 01/16/2018  . PAH (pulmonary artery hypertension) (HCC) 08/03/2017  . Advanced care planning/counseling discussion 06/25/2017  . Synovial cyst of lumbar facet joint 11/13/2016  . Thoracic aortic atherosclerosis (HCC)   . CAD (coronary artery disease) 07/30/2016  . Centrilobular emphysema (HCC) 07/30/2016  . Ex-smoker 07/08/2016  . Nonspecific abnormal electrocardiogram (ECG) (EKG) 07/08/2016  . Chronic lumbar radicular pain (L4) (Bilateral) 06/09/2016  . DOE (dyspnea on exertion) 05/22/2016  . Degenerative joint disease (DJD) of hip 04/17/2016  . Somatic dysfunction of sacroiliac joint 04/17/2016  . S/P Lumbar Fusion (L3-4 PLIF) 04/17/2016  . Cervical fusion syndrome 04/17/2016  . Pedal edema 02/19/2016  . Chronic pain syndrome 02/19/2016  . Abdominal aortic atherosclerosis (HCC) 12/11/2015  . MRSA (methicillin resistant Staphylococcus aureus) infection 10/11/2012  . Urinary urgency 07/28/2012  . Infection or inflammatory reaction due to internal joint prosthesis (HCC) 12/03/2011  . Morbid obesity with BMI of 60.0-69.9, adult (HCC) 05/13/2011  . Essential hypertension 05/17/2010  . Cervical radiculopathy (Right) 12/25/2009  . Benign positional vertigo 07/21/2008  . Hyperlipidemia associated with type 2 diabetes mellitus (HCC) 08/16/2007  . Hypothyroidism 07/30/2007  . DM type 2 with diabetic peripheral neuropathy (HCC) 07/30/2007  . Normocytic anemia 07/30/2007  . Bipolar disorder (HCC) 07/30/2007  . Allergic rhinitis 07/30/2007  . Asthma 07/30/2007  . GERD 07/30/2007  . Osteoarthritis 07/30/2007  . OSA treated  with BiPAP 07/30/2007    Conditions to be addressed/monitored:CHF and DMII  Care Plan : Heart Failure (Adult)  Updates made by Otho Ket, RN since 12/12/2020 12:00 AM  Problem: Disease Progression (Heart Failure)   Priority: High  Onset Date: 11/12/2020  Long-Range Goal: patient will verbalize heart failure home managment strategies   Start Date: 11/12/2020  Expected End Date: 03/28/2021  This Visit's Progress: On track  Recent Progress: On track  Priority: High  Current Barriers:  Marland Kitchen Knowledge deficit related to basic heart failure pathophysiology and self care management . Cognitive Deficits: Patient reports having difficulty with focus/ concentration . Unable to independently : Patient states her daughter sets up her pill box weekly and assists her with  any management of her medications.  . Unable to perform IADLs independently:  . Case Manager Clinical Goal(s):  . patient will verbalize understanding of Heart Failure Action Plan and when to call doctor:  RNCM reviewed heart failure action plan and zones with patient. Discussed importance of weigh daily and recording weights.  . patient will take all Heart Failure mediations as prescribed . patient will weigh daily  and record (notifying MD of 3 lb weight gain over night or 5 lb in a week) . Patient will wear oxygen as advised by provider and check oxygen saturations daily.   Patient reports she is not using her oxygen around the clock, only as needed.  She reports her resting oxygen saturations range from 90 to 91% at rest.  She states she feels she does not need the oxygen as much at rest but uses it regularly when active.  Interventions:  . Collaboration with [name], MD regarding development and update of comprehensive plan of care as evidenced by provider attestation and co-signature . Inter-disciplinary care team collaboration (see longitudinal plan of care) . Provided verbal education on low sodium diet:  Patient  reports she continues to adhere to a low sodium diet.   . Reviewed Heart Failure Action Plan in depth and provided written copy: Patient reports being in the yellow zone today of the heart failure action plan. Discussed importance of daily weight and advised patient to weigh and record daily: Patient reports  she did not weight today. She reports yesterdays weight was 362 lbs.  . Reviewed role of diuretics in prevention of fluid overload and management of heart failure: Patient reports having some swelling in her ankles.  Patient reports she is out of her fluid pill. She states her cardiology office was contacted and she was arranged a follow up visit for 12/12/2020. Patient reports she does ankle rotations and keeps her legs elevated when sitting.  . Discussed concerns with patients oxygen level dropping to low levels.  Patient advised to wear her oxygen as advised by her provider and continue to monitor her oxygen saturations daily.  Patient Goals/Self-Care Activities . call office if I gain more than 3 pounds in one day or 5 pounds in one week . Continue to do ankle pumps when sitting . Continue to keep legs up while sitting . Continue to follow a low salt diet.  . Continue to watch for swelling in feet, ankles and legs every day . Continue to weigh daily and record weights.   . Wear oxygen as advised by provider and check oxygen saturations daily Follow Up Plan: The patient has been provided with contact information for the care management team and has been advised to call with any health related questions or concerns.  The care management team will reach out to the patient again over the next 45 days.     Care Plan : Diabetes Type 2 (Adult)  Updates made by Otho Ket, RN since 12/12/2020 12:00 AM  Problem: Glycemic Management (Diabetes, Type 2)   Long-Range Goal: Patient will verbalize home management strategies for diabetes   Start Date: 11/12/2020  Expected End Date: 02/10/2021  This  Visit's Progress: On track  Recent Progress: On track  Priority: High  Objective:  Lab Results  Component Value Date   HGBA1C 6.5 (A) 08/10/2020 .   Lab Results  Component Value Date   CREATININE 0.83 06/15/2020   CREATININE 0.51 05/30/2020   CREATININE 0.62 05/22/2020   Current Barriers:  Marland Kitchen Knowledge Deficits related to basic Diabetes self care/management Case Manager Clinical Goal(s):  . patient will demonstrate improved adherence to prescribed treatment plan for diabetes self care/management as evidenced by:  Interventions:  . Collaboration with [name], MD regarding development and update of comprehensive plan of care as evidenced by provider attestation and co-signature . Inter-disciplinary care team collaboration (see longitudinal plan of care) . Reviewed medications with  and discussed importance of medication adherence:  Patient gave verbal permission to speak with her daughter, Lashannon Tortorich regarding her medical information. Medications reviewed with her daughter.  . Discussed plans with patient for ongoing care management follow up and provided patient with direct contact information for care management team . Provided patient with written educational materials related to hypo and hyperglycemia and importance of correct treatment . Patient will monitor and record blood sugars at least 2-3 times per week:  Patient reports her blood sugars have been running in the 170's fasting. She states she contributes this to being on a new medication, diflucan, for yeast infection. . Patient will continue to adhere to a ADA/carb modified diet.  Patient Goals/Self-Care Activities . Patient will:  - Continue to check blood sugar at least 2-3 times per week: Review treatment plan for hyperglycemia (high ) and hypoglycemia (low) as indicated on your after visit summary.  - Check blood sugar if I feel it is too high or too low - Take the blood sugar meter to all doctor visits  - Continue  to take medication as prescribed.  - Attend scheduled provider appointments - Continue to follow an ADA/carb modified Follow Up Plan: The patient has been provided with contact information for the care management team and has been advised to call with any health related questions or concerns.  The care management team will reach out to the patient again over the next 45 days.       Plan:The patient has been provided with contact information for the care management team and has been advised to call with any health related questions or concerns.  and The care management team will reach out to the patient again over the next 45 days. George Ina RN,BSN,CCM RN Case Manager [facility] [place]  [phone]

## 2020-12-11 NOTE — Progress Notes (Unsigned)
Cardiology Office Note   Date:  12/12/2020   ID:  Amy Barnes, DOB December 05, 1950, MRN [id number]  PCP:  Amy Boyden, MD  Cardiologist:   Amy Rotunda, MD   Chief Complaint  Patient presents with  . Shortness of Breath  . Edema      History of Present Illness: Amy Barnes is a 70 y.o. female who seen in the emergency room in February 2021 with nausea and vomiting.  Her troponin was elevated.  Her D-dimer was elevated.  She had a CT scan that was negative for pulmonary embolism but did show coronary calcification and congestive heart failure. Work up during her hospitalization in showed a new CM with an EF of 35-40% and apical WMA by echo on 11/09/2019.  Previous echo 4 days earlier on 11/05/2019 showed an EF of 60-65%.  Diagnostic cath done 11/10/2019 showed normal coronaries.  She has been treated as NICM with Entresto, lasix, and Coreg.  She was in the hospital with hyponatremia.  She is chronically on O2.     She called recently to get a prescription for Lasix and she was asked to come to an appt. she has now been out of her pills for couple of days and her weight is up about 9 pounds.  She did have to go up on her chronic oxygen from 2 to 3 L because her oxygen saturation was in the low 90s high 80s.  She notices increased swelling in her legs.  She watches her salt and fluid intake.  She is very limited in her activities and gets around mostly in a wheelchair as she ambulates a little in the house.  Past Medical History:  Diagnosis Date  . Allergic rhinitis   . Ambulates with cane    straight  . Anemia   . Anxiety   . Asthma    seasonal  . Bipolar affective disorder (HCC)    takes Synthroid meds for Bipolar  . CAD (coronary artery disease) 07/2016   by CT scan  . Carpal tunnel syndrome    had surgery but occasional still has some issues per patient  . Cataract   . Centrilobular emphysema (HCC) 07/2016   by CT scan - pt not aware of this  . Constipation due to  pain medication   . Depression with anxiety   . Diabetes mellitus    type 2 - no meds diet controlled  . GERD (gastroesophageal reflux disease)   . History of blood transfusion   . 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 Hyperlipidemia   . Hypertension   . Hypothyroidism   . OSA (obstructive sleep apnea)    no longer using cpap, uses a bed that raises and lowers hob  . Osteoarthritis   . Osteopenia 08/11/2019   DEXA 07/2019 - T -1.1 R femur (osteopenia)  . Pneumonia   . Restless legs   . Septic arthritis (HCC) 10/11/2012  . Shingles 06/30/2016  . Status post revision of total hip replacement bilateral   prosthetic infection R 2013, L 2015  . Thoracic aortic atherosclerosis (HCC) 11/207   by CT    Past Surgical History:  Procedure Laterality Date  . BREAST CYST ASPIRATION    . CARPAL TUNNEL RELEASE Right 05/15/2011  . CARPAL TUNNEL RELEASE Left 12/24/2017   Procedure: LEFT CARPAL TUNNEL RELEASE;  Surgeon: [name], MD;  Location: [facility];  Service:  Orthopedics;  Laterality: Left;  . CERVICAL FUSION  2012   C2/3/4  . COLONOSCOPY WITH PROPOFOL N/A 12/31/2015   diverticulosis, int hem, o/w normal rpt 10 yrs (Rein)  . ESOPHAGOGASTRODUODENOSCOPY (EGD) WITH PROPOFOL N/A 11/12/2019   Procedure: ESOPHAGOGASTRODUODENOSCOPY (EGD) WITH PROPOFOL;  Surgeon: Sherrilyn Rist, MD;  Location: [facility];  Service: Gastroenterology;  Laterality: N/A;  . EYE SURGERY Bilateral    cataract surgery with lens implant  . FOOT SURGERY  1982   bone spur  . I & D EXTREMITY  09/06/2012   Budd Palmer, MD; Right;  I&D of right thigh  . I & D EXTREMITY Left 07/2013   wound vac - daily doxycycline indefinitely  . KNEE ARTHROSCOPY  09/01/2012   Dannielle Huh, MD;  Right  . LEFT HEART CATH AND CORONARY ANGIOGRAPHY N/A 11/10/2019   Procedure: LEFT HEART CATH AND CORONARY ANGIOGRAPHY;  Surgeon: Lennette Bihari, MD;  Location: [facility];   Service: Cardiovascular;  Laterality: N/A;  . LUMBAR FUSION  01/08/2012   L3-4  . LUMBAR FUSION  02/2019   unexpectedly discovered MRSA infection - pus Lovell Sheehan)   . LUMBAR LAMINECTOMY/DECOMPRESSION MICRODISCECTOMY N/A 11/13/2016   LAMINOTOMY/LAMINECTOMY LUMBAR FOUR LUMBAR FIVE  WITH RESECTION OF SYNOVIAL CYST;  Surgeon: Tressie Stalker, MD  . NECK SURGERY     Herniated disk C2,3,4  . NOSE SURGERY    . PARTIAL HYSTERECTOMY  1984   for mennorhagia, ovaries remain  . REVISION TOTAL HIP ARTHROPLASTY Left 08/28/2011  . TMJ ARTHROPLASTY  1982  . TOTAL HIP ARTHROPLASTY Left 2002  . TRIGGER FINGER RELEASE Right 05/15/2011   long finger     Current Outpatient Medications  Medication Sig Dispense Refill  . albuterol (PROVENTIL HFA;VENTOLIN HFA) 108 (90 Base) MCG/ACT inhaler Inhale 2 puffs into the lungs every 6 (six) hours as needed for wheezing or shortness of breath. 1 Inhaler 6  . aspirin 81 MG EC tablet Take 81 mg by mouth at bedtime.    . budesonide-formoterol (SYMBICORT) 160-4.5 MCG/ACT inhaler Inhale 2 puffs into the lungs 2 (two) times daily. (Patient taking differently: Inhale 2 puffs into the lungs 2 (two) times daily. Pt uses as needed.) 1 Inhaler 3  . carvedilol (COREG) 6.25 MG tablet Take 1 tablet (6.25 mg total) by mouth 2 (two) times daily. 180 tablet 3  . dicyclomine (BENTYL) 20 MG tablet Take 1 tablet (20 mg total) by mouth in the morning and at bedtime. (Patient taking differently: Take 20 mg by mouth in the morning and at bedtime. As needed) 60 tablet 2  . doxycycline (VIBRA-TABS) 100 MG tablet Take 1 tablet (100 mg total) by mouth 2 (two) times daily. 180 tablet 3  . fluconazole (DIFLUCAN) 200 MG tablet Take 200 mg by mouth daily. Daughter states patient is taking 1 in am    . FLUoxetine (PROZAC) 40 MG capsule Take 40 mg by mouth daily.     . furosemide (LASIX) 20 MG tablet Take 2 tablets (40 mg total) by mouth daily. And as directed 180 tablet 3  . gabapentin (NEURONTIN) 100  MG capsule TAKE ONE TO TWO CAPSULES BY MOUTH AT BEDTIME 60 capsule 1  . lamoTRIgine (LAMICTAL) 200 MG tablet Take 400 mg by mouth daily.    Marland Kitchen levothyroxine (SYNTHROID) 150 MCG tablet TAKE ONE TABLET BY MOUTH AT BEDTIME (Patient taking differently: Take 150 mcg by mouth daily before breakfast. Pt takes at bedtime.) 90 tablet 3  . metFORMIN (GLUCOPHAGE) 500 MG tablet  Take 1 tablet (500 mg total) by mouth daily with breakfast. (Patient taking differently: Take 500 mg by mouth daily with breakfast. Pt takes at bedtime) 90 tablet 3  . montelukast (SINGULAIR) 10 MG tablet Take 1 tablet (10 mg total) by mouth at bedtime. 90 tablet 3  . Multiple Vitamin (MULTIVITAMIN WITH MINERALS) TABS tablet Take 2 tablets by mouth daily.     . ondansetron (ZOFRAN ODT) 8 MG disintegrating tablet Take 1 tablet (8 mg total) by mouth 2 (two) times daily as needed for nausea or vomiting. 10 tablet 0  . Oxcarbazepine (TRILEPTAL) 300 MG tablet Take 300 mg by mouth 2 (two) times daily. Pt takes 1 tablet at bedtime    . pantoprazole (PROTONIX) 40 MG tablet TAKE ONE TABLET BY MOUTH TWICE A DAY 180 tablet 3  . polyethylene glycol (MIRALAX / GLYCOLAX) 17 g packet Take 17 g by mouth 2 (two) times daily as needed for mild constipation. 14 each 0  . prochlorperazine (COMPAZINE) 10 MG tablet Take 1 tablet (10 mg total) by mouth every 6 (six) hours as needed for nausea or vomiting. 30 tablet 1  . promethazine (PHENERGAN) 25 MG suppository Place 1 suppository (25 mg total) rectally every 6 (six) hours as needed for nausea or vomiting. 12 each 0  . simvastatin (ZOCOR) 40 MG tablet TAKE ONE TABLET BY MOUTH EVERY EVENING 90 tablet 3  . sucralfate (CARAFATE) 1 GM/10ML suspension Take 10 mLs (1 g total) by mouth 4 (four) times daily -  with meals and at bedtime. (Patient taking differently: Take 1 g by mouth 4 (four) times daily -  with meals and at bedtime. Pt takes as needed.) 420 mL 0  . potassium chloride (KLOR-CON) 10 MEQ tablet Take 1  tablet (10 mEq total) by mouth daily. With lasix. May take an additional 10 meq when taking 60 meq of lasix 40 tablet 6   No current facility-administered medications for this visit.    Allergies:   Toviaz [fesoterodine], Cephalexin, Hydrocodone, Entresto [sacubitril-valsartan], Macrodantin [nitrofurantoin], Tolterodine tartrate, Risperidone and related, Seroquel [quetiapine fumarate], and Sulfa antibiotics    ROS:  Please see the history of present illness.   Otherwise, review of systems are positive for none.   All other systems are reviewed and negative.    PHYSICAL EXAM: VS:  BP 110/76 (BP Location: Right Wrist, Patient Position: Sitting, Cuff Size: Large)   Pulse 63   Ht 5\' 4"  (1.626 m)   Wt (!) 371 lb (168.3 kg)   LMP  (LMP Unknown)   BMI 63.68 kg/m  , BMI Body mass index is 63.68 kg/m. GEN:  No distress NECK:  No jugular venous distention at 90 degrees, waveform within normal limits, carotid upstroke brisk and symmetric, no bruits, no thyromegaly LYMPHATICS:  No cervical adenopathy LUNGS:  Clear to auscultation bilaterally BACK:  No CVA tenderness CHEST:  Unremarkable HEART:  S1 and S2 within normal limits, no S3, no S4, no clicks, no rubs, no murmurs ABD:  Positive bowel sounds normal in frequency in pitch, no bruits, no rebound, no guarding, unable to assess midline mass or bruit with the patient seated. EXT:  2 plus pulses throughout, moderate edema, no cyanosis no clubbing SKIN:  No rashes no nodules NEURO:  Cranial nerves II through XII grossly intact, motor grossly intact throughout PSYCH:  Cognitively intact, oriented to person place and time  EKG:  EKG is  ordered today. Sinus rhythm, rate 63, interventricular conduction delay, left axis deviation  Recent Labs:  05/08/2020: Magnesium 2.2 05/13/2020: TSH 0.873 05/16/2020: B Natriuretic Peptide 139.5 05/30/2020: ALT 34; Pro B Natriuretic peptide (BNP) 285.0 11/23/2020: BUN 15; Creat 0.70; Hemoglobin 11.1; Platelets 267;  Potassium 4.4; Sodium 143    Lipid Panel    Component Value Date/Time   CHOL 115 05/05/2020 0419   CHOL 133 06/18/2015 0000   TRIG 35 05/12/2020 2227   TRIG 62 06/18/2015 0000   HDL 44 05/05/2020 0419   CHOLHDL 2.6 05/05/2020 0419   VLDL 13 05/05/2020 0419   LDLCALC 58 05/05/2020 0419   LDLCALC 57 06/18/2015 0000   LDLDIRECT 48.0 03/13/2016 1025      Wt Readings from Last 3 Encounters:  12/12/20 (!) 371 lb (168.3 kg)  12/11/20 (!) 362 lb (164.2 kg)  11/23/20 (!) 362 lb (164.2 kg)      Other studies Reviewed: Additional studies/ records that were reviewed today include:  Labs Review of the above records demonstrates:  Please see elsewhere in the note.     ASSESSMENT AND PLAN:  NICM- Her ejection fraction improved on follow-up echo in May of last year.   She has some diastolic dysfunction mostly manifesting as lower extremity swelling.  I will go ahead and give her the Lasix renewal with her baseline dose being 40 mg daily with 10 mEq potassium.  She will take 60 mg for the next 3 days with 20 mEq of potassium while we are trying to get her weight back down.   HTN- Her blood pressure is at target.  No change in therapy.   Sleep apnea- She uses BiPAP followed by Dr. Tresa Endo.  She does not like her CPAP but she is working with the company.   Hyponatremia - Her Na improved to 143.  It was stable on the last 2 readings.  No change in therapy or further testing at this point.  She can have routine blood work by her primary provider who she sees in a few months.   Current medicines are reviewed at length with the patient today.  The patient does not have concerns regarding medicines.  The following changes have been made: As above Labs/ tests ordered today include: None  Orders Placed This Encounter  Procedures  . EKG 12-Lead     Disposition:   FU with me 12 months.     Signed, Amy Rotunda, MD  12/12/2020 11:45 AM    [facility]

## 2020-12-12 ENCOUNTER — Ambulatory Visit (INDEPENDENT_AMBULATORY_CARE_PROVIDER_SITE_OTHER): Payer: Medicare Other | Admitting: Cardiology

## 2020-12-12 ENCOUNTER — Other Ambulatory Visit: Payer: Self-pay

## 2020-12-12 ENCOUNTER — Encounter: Payer: Self-pay | Admitting: Cardiology

## 2020-12-12 VITALS — BP 110/76 | HR 63 | Ht 64.0 in | Wt 371.0 lb

## 2020-12-12 DIAGNOSIS — I10 Essential (primary) hypertension: Secondary | ICD-10-CM | POA: Diagnosis not present

## 2020-12-12 DIAGNOSIS — I428 Other cardiomyopathies: Secondary | ICD-10-CM

## 2020-12-12 DIAGNOSIS — E871 Hypo-osmolality and hyponatremia: Secondary | ICD-10-CM

## 2020-12-12 MED ORDER — FUROSEMIDE 20 MG PO TABS: 40.0000 mg | ORAL_TABLET | Freq: Every day | ORAL | 3 refills | Status: DC

## 2020-12-12 MED ORDER — POTASSIUM CHLORIDE ER 10 MEQ PO TBCR: 10.0000 meq | EXTENDED_RELEASE_TABLET | Freq: Every day | ORAL | 6 refills | Status: DC

## 2020-12-12 NOTE — Patient Instructions (Addendum)
Visit Information:   Thank you for taking the time to speak with me today.   PATIENT GOALS: Goals Addressed            This Visit's Progress   . Monitor and Manage My Blood Sugar-Diabetes Type 2   On track    Timeframe:  Long-Range Goal Priority:  High Start Date:  11/12/2020                           Expected End Date: 02/25/2021                      Follow Up Date  01/14/2021     - Continue to check blood sugar at least 2-3 times per week: Review treatment plan for hyperglycemia (high ) and hypoglycemia (low) as indicated on your after visit summary.  - Check blood sugar if I feel it is too high or too low - Take the blood sugar meter to all doctor visits  - Continue to take medication as prescribed.  - Attend scheduled provider appointments - Continue to follow an ADA/carb modified   Why is this important?    Checking your blood sugar at home helps to keep it from getting very high or very low.   Writing the results in a diary or log helps the doctor know how to care for you.   Your blood sugar log should have the time, date and the results.   Also, write down the amount of insulin or other medicine that you take.   Other information, like what you ate, exercise done and how you were feeling, will also be helpful.     Notes:     . Track and Manage Fluids and Swelling-Heart Failure   On track    Timeframe:  Long-Range Goal Priority:  High Start Date:  11/12/2020                           Expected End Date: 03/28/2021                    Follow Up Date 01/14/2021   .  call office if I gain more than 3 pounds in one day or 5 pounds in one week . Continue to do ankle pumps when sitting . Continue to keep legs up while sitting . Continue to follow a low salt diet.  . Continue to watch for swelling in feet, ankles and legs every day . Continue to weigh daily and record weights. .  Wear oxygen as advised by provider and check oxygen saturations daily Why is this important?     It is important to check your weight daily and watch how much salt and liquids you have.   It will help you to manage your heart failure.    Notes:      Hyperglycemia Hyperglycemia is when the sugar (glucose) level in your blood is too high. High blood sugar can happen to people who have or do not have diabetes. High blood sugar can happen quickly. It can be an emergency. What are the causes? If you have diabetes, high blood sugar may be caused by:  Medicines that increase blood sugar or affect your control of diabetes.  Getting less physical activity.  Overeating.  Being sick or injured or having an infection.  Having surgery.  Stress.  Not giving yourself enough insulin (if  you are taking it). You may have high blood sugar because you have diabetes that has not been diagnosed yet. If you do not have diabetes, high blood sugar may be caused by:  Certain medicines.  Stress.  A bad illness.  An infection.  Having surgery.  Diseases of the pancreas. What increases the risk? This condition is more likely to develop in people who have risk factors for diabetes, such as:  Having a family member with diabetes.  Certain conditions in which the body's defense system (immune system) attacks itself. These are called autoimmune disorders.  Being overweight.  Not being active.  Having a condition called insulin resistance.  Having a history of: ? Prediabetes. ? Diabetes when pregnant. ? Polycystic ovarian syndrome (PCOS). What are the signs or symptoms? This condition may not cause symptoms. If you do have symptoms, they may include:  Feeling more thirsty than normal.  Needing to pee (urinate) more often than normal.  Hunger.  Feeling very tired.  Blurry eyesight (vision). You may get other symptoms as the condition gets worse, such as:  Dry mouth.  Pain in your belly (abdomen).  Not being hungry (loss of appetite).  Breath that smells  fruity.  Weakness.  Weight loss that is not planned.  A tingling or numb feeling in your hands or feet.  A headache.  Cuts or bruises that heal slowly. How is this treated? Treatment depends on the cause of your condition. Treatment may include:  Taking medicine to control your blood sugar levels.  Changing your medicine or dosage if you take insulin or other diabetes medicines.  Lifestyle changes. These may include: ? Exercising more. ? Eating healthier foods. ? Losing weight.  Treating an illness or infection.  Checking your blood sugar more often.  Stopping or reducing steroid medicines. If your condition gets very bad, you will need to be treated in the hospital. Follow these instructions at home: General instructions  Take over-the-counter and prescription medicines only as told by your doctor.  Do not smoke or use any products that contain nicotine or tobacco. If you need help quitting, ask your doctor.  If you drink alcohol: ? Limit how much you have to:  0-1 drink a day for women who are not pregnant.  0-2 drinks a day for men. ? Know how much alcohol is in a drink. In the [place] one drink equals one 12 oz bottle of beer (355 mL), one 5 oz glass of wine (148 mL), or one 1 oz glass of hard liquor (44 mL).  Manage stress. If you need help with this, ask your doctor.  Do exercises as told by your doctor.  Keep all follow-up visits. Eating and drinking  Stay at a healthy weight.  Make sure you drink enough fluid when you: ? Exercise. ? Get sick. ? Are in hot temperatures.  Drink enough fluid to keep your pee (urine) pale yellow.   If you have diabetes:  Know the symptoms of high blood sugar.  Follow your diabetes management plan as told by your doctor. Make sure you: ? Take insulin and medicines as told. ? Follow your exercise plan. ? Follow your meal plan. Eat on time. Do not skip meals. ? Check your blood sugar as often as told. Make sure you  check before and after exercise. If you exercise longer or in a different way, check your blood sugar more often. ? Follow your sick day plan whenever you cannot eat or drink normally.  Make this plan ahead of time with your doctor.  Share your diabetes management plan with people in your workplace, school, and household.  Check your pee for ketones when you are ill and as told by your doctor.  Carry a card or wear jewelry that says that you have diabetes.   Where to find more information American Diabetes Association: www.diabetes.org Contact a doctor if:  Your blood sugar level is at or above 240 mg/dL (09.7 mmol/L) for 2 days in a row.  You have problems keeping your blood sugar in your target range.  You have high blood pressure often.  You have signs of illness, such as: ? Feeling like you may vomit (feeling nauseous). ? Vomiting. ? A fever. Get help right away if:  Your blood sugar monitor reads "high" even when you are taking insulin.  You have trouble breathing.  You have a change in how you think, feel, or act (mental status).  You feel like you may vomit, and the feeling does not go away.  You cannot stop vomiting. These symptoms may be an emergency. Get medical help right away. Call your local emergency services (911 in the [place]  Do not wait to see if the symptoms will go away.  Do not drive yourself to the hospital. Summary  Hyperglycemia is when the sugar (glucose) level in your blood is too high.  High blood sugar can happen to people who have or do not have diabetes.  Make sure you drink enough fluids and follow your meal plan. Exercise as often as told by your doctor.  Contact your doctor if you have problems keeping your blood sugar in your target range. This information is not intended to replace advice given to you by your health care provider. Make sure you discuss any questions you have with your health care provider. Document Revised: 06/29/2020  Document Reviewed: 06/29/2020 Elsevier Patient Education  2021 Elsevier Inc.   Patient verbalizes understanding of instructions provided today and agrees to view in [facility].   The patient has been provided with contact information for the care management team and has been advised to call with any health related questions or concerns.  The care management team will reach out to the patient again over the next 45 days.   George Ina RN,BSN,CCM RN Case Manager [facility] [place]  [phone]

## 2020-12-12 NOTE — Patient Instructions (Addendum)
Medication Instructions   Lasix 20 mg 3 pills daily disp 180 with 3 refills.  she is only going to take 60 mg for 3 days and then go to her baseline line 40 mg daily. also increase her potassium to 2 pills daily while she is taking the 60 mg lasix. then back to one pill daily.      *If you need a refill on your cardiac medications before your next appointment, please call your pharmacy*   Lab Work: Not  needed    Testing/Procedures: Not needed   Follow-Up: At [facility], you and your health needs are our priority.  As part of our continuing mission to provide you with exceptional heart care, we have created designated Provider Care Teams.  These Care Teams include your primary Cardiologist (physician) and Advanced Practice Providers (APPs -  Physician Assistants and Nurse Practitioners) who all work together to provide you with the care you need, when you need it.     Your next appointment:   12 month(s)  The format for your next appointment:   In Person  Provider:   Rollene Rotunda, MD

## 2020-12-24 ENCOUNTER — Telehealth: Payer: Self-pay

## 2020-12-24 NOTE — Telephone Encounter (Signed)
Filled and in Lisa's box 

## 2020-12-24 NOTE — Telephone Encounter (Signed)
Faxed form.

## 2020-12-24 NOTE — Telephone Encounter (Signed)
Received faxed Certificate of Medical Necessity (CMN) form for oxygen from [employer].  Placed order in Dr. Timoteo Expose box.

## 2020-12-25 ENCOUNTER — Other Ambulatory Visit: Payer: Self-pay | Admitting: Internal Medicine

## 2020-12-27 DIAGNOSIS — H5213 Myopia, bilateral: Secondary | ICD-10-CM | POA: Diagnosis not present

## 2020-12-27 DIAGNOSIS — Z9842 Cataract extraction status, left eye: Secondary | ICD-10-CM | POA: Diagnosis not present

## 2020-12-27 DIAGNOSIS — Z9841 Cataract extraction status, right eye: Secondary | ICD-10-CM | POA: Diagnosis not present

## 2020-12-27 DIAGNOSIS — E119 Type 2 diabetes mellitus without complications: Secondary | ICD-10-CM | POA: Diagnosis not present

## 2020-12-27 DIAGNOSIS — H16223 Keratoconjunctivitis sicca, not specified as Sjogren's, bilateral: Secondary | ICD-10-CM | POA: Diagnosis not present

## 2020-12-27 DIAGNOSIS — H04123 Dry eye syndrome of bilateral lacrimal glands: Secondary | ICD-10-CM | POA: Diagnosis not present

## 2020-12-27 LAB — HM DIABETES EYE EXAM

## 2020-12-31 ENCOUNTER — Encounter: Payer: Self-pay | Admitting: Family Medicine

## 2021-01-03 ENCOUNTER — Telehealth: Payer: Self-pay

## 2021-01-03 NOTE — Telephone Encounter (Signed)
Dr. Reece Agar, have you received this info?

## 2021-01-03 NOTE — Telephone Encounter (Signed)
Haven't received as of yet for this patient.  However I wouldn't recommend any cardiac genetic testing. She may discuss this with her cardiologist if she desires this.

## 2021-01-03 NOTE — Telephone Encounter (Signed)
Amy Barnes from [employer] [facility] called asking about the status of the requisition form regarding cardiac genetic testing. Please advise.

## 2021-01-04 NOTE — Telephone Encounter (Signed)
Lvm asking [name] to call back.  Need to relay Dr. Timoteo Expose message.

## 2021-01-07 ENCOUNTER — Ambulatory Visit: Payer: Medicare Other | Admitting: Urology

## 2021-01-07 NOTE — Telephone Encounter (Signed)
Lvm asking Kate to call back.  Need to relay Dr. G's message.  

## 2021-01-08 NOTE — Telephone Encounter (Signed)
Lvm asking [name] to call back.  Need to relay Dr. Timoteo Expose message.    No response after several calls.  Closing encounter.

## 2021-01-08 NOTE — Telephone Encounter (Addendum)
Received requisition form. Recommendation doesn't change. Don't recommend cardiac genetic testing at this time. They may further discuss with cardiology if interested.

## 2021-01-10 ENCOUNTER — Encounter: Payer: Self-pay | Admitting: Family Medicine

## 2021-01-14 ENCOUNTER — Ambulatory Visit: Payer: Medicare Other | Admitting: Urology

## 2021-01-14 ENCOUNTER — Ambulatory Visit (INDEPENDENT_AMBULATORY_CARE_PROVIDER_SITE_OTHER): Payer: Medicare Other

## 2021-01-14 DIAGNOSIS — J9611 Chronic respiratory failure with hypoxia: Secondary | ICD-10-CM

## 2021-01-14 DIAGNOSIS — E1142 Type 2 diabetes mellitus with diabetic polyneuropathy: Secondary | ICD-10-CM

## 2021-01-14 NOTE — Chronic Care Management (AMB) (Signed)
Chronic Care Management   CCM RN Visit Note  01/14/2021 Name: Amy Barnes MRN: [id number] DOB: [birth year]  Subjective: Amy Barnes is a 70 y.o. year old female who is a primary care patient of [name], MD. The care management team was consulted for assistance with disease management and care coordination needs.    Engaged with patient by telephone for follow up visit in response to provider referral for case management and/or care coordination services.   Consent to Services:  The patient was given information about Chronic Care Management services, agreed to services, and gave verbal consent prior to initiation of services.  Please see initial visit note for detailed documentation.   Patient agreed to services and verbal consent obtained.   Assessment: Review of patient past medical history, allergies, medications, health status, including review of consultants reports, laboratory and other test data, was performed as part of comprehensive evaluation and provision of chronic care management services.   SDOH (Social Determinants of Health) assessments and interventions performed:    CCM Care Plan  Allergies  Allergen Reactions  . Gala Murdoch [Fesoterodine] Nausea And Vomiting    Severe reaction - s/p several ER visits then hospitalization ?stress induced cardiomyopathy  . Cephalexin Hives  . Hydrocodone Other (See Comments)    Reaction:  Hallucinations   . Entresto [Sacubitril-Valsartan]   . Macrodantin [Nitrofurantoin]     Dyspnea, lips peeling  . Tolterodine Tartrate Nausea And Vomiting  . Risperidone And Related Other (See Comments)    Reaction:  Made pt excessively sleepy  . Seroquel [Quetiapine Fumarate] Other (See Comments)    Reaction:  Made pt excessively sleepy  . Sulfa Antibiotics Rash    Tolerated bactrim course well 06/2020    Outpatient Encounter Medications as of 01/14/2021  Medication Sig Note  . albuterol (PROVENTIL HFA;VENTOLIN HFA) 108 (90 Base)  MCG/ACT inhaler Inhale 2 puffs into the lungs every 6 (six) hours as needed for wheezing or shortness of breath.   Marland Kitchen aspirin 81 MG EC tablet Take 81 mg by mouth at bedtime.   . budesonide-formoterol (SYMBICORT) 160-4.5 MCG/ACT inhaler Inhale 2 puffs into the lungs 2 (two) times daily. (Patient taking differently: Inhale 2 puffs into the lungs 2 (two) times daily. Pt uses as needed.)   . carvedilol (COREG) 6.25 MG tablet Take 1 tablet (6.25 mg total) by mouth 2 (two) times daily.   Marland Kitchen dicyclomine (BENTYL) 20 MG tablet Take 1 tablet (20 mg total) by mouth in the morning and at bedtime. (Patient taking differently: Take 20 mg by mouth in the morning and at bedtime. As needed)   . doxycycline (VIBRA-TABS) 100 MG tablet TAKE ONE TABLET BY MOUTH TWICE A DAY   . fluconazole (DIFLUCAN) 200 MG tablet Take 200 mg by mouth daily. Daughter states patient is taking 1 in am   . FLUoxetine (PROZAC) 40 MG capsule Take 40 mg by mouth daily.    . furosemide (LASIX) 20 MG tablet Take 2 tablets (40 mg total) by mouth daily. And as directed   . gabapentin (NEURONTIN) 100 MG capsule TAKE ONE TO TWO CAPSULES BY MOUTH AT BEDTIME   . lamoTRIgine (LAMICTAL) 200 MG tablet Take 400 mg by mouth daily.   Marland Kitchen levothyroxine (SYNTHROID) 150 MCG tablet TAKE ONE TABLET BY MOUTH AT BEDTIME (Patient taking differently: Take 150 mcg by mouth daily before breakfast. Pt takes at bedtime.) 12/11/2020: Daughter states patient takes 1 at bedtime  . metFORMIN (GLUCOPHAGE) 500 MG tablet Take 1 tablet (500 mg  total) by mouth daily with breakfast. (Patient taking differently: Take 500 mg by mouth daily with breakfast. Pt takes at bedtime)   . montelukast (SINGULAIR) 10 MG tablet Take 1 tablet (10 mg total) by mouth at bedtime.   . Multiple Vitamin (MULTIVITAMIN WITH MINERALS) TABS tablet Take 2 tablets by mouth daily.    . ondansetron (ZOFRAN ODT) 8 MG disintegrating tablet Take 1 tablet (8 mg total) by mouth 2 (two) times daily as needed for nausea  or vomiting.   . Oxcarbazepine (TRILEPTAL) 300 MG tablet Take 300 mg by mouth 2 (two) times daily. Pt takes 1 tablet at bedtime   . pantoprazole (PROTONIX) 40 MG tablet TAKE ONE TABLET BY MOUTH TWICE A DAY   . polyethylene glycol (MIRALAX / GLYCOLAX) 17 g packet Take 17 g by mouth 2 (two) times daily as needed for mild constipation.   . potassium chloride (KLOR-CON) 10 MEQ tablet Take 1 tablet (10 mEq total) by mouth daily. With lasix. May take an additional 10 meq when taking 60 meq of lasix   . prochlorperazine (COMPAZINE) 10 MG tablet Take 1 tablet (10 mg total) by mouth every 6 (six) hours as needed for nausea or vomiting.   . promethazine (PHENERGAN) 25 MG suppository Place 1 suppository (25 mg total) rectally every 6 (six) hours as needed for nausea or vomiting.   . simvastatin (ZOCOR) 40 MG tablet TAKE ONE TABLET BY MOUTH EVERY EVENING   . sucralfate (CARAFATE) 1 GM/10ML suspension Take 10 mLs (1 g total) by mouth 4 (four) times daily -  with meals and at bedtime. (Patient taking differently: Take 1 g by mouth 4 (four) times daily -  with meals and at bedtime. Pt takes as needed.)    No facility-administered encounter medications on file as of 01/14/2021.    Patient Active Problem List   Diagnosis Date Noted  . DNR (do not resuscitate) 05/31/2020  . Chronic respiratory failure with hypoxia, on home O2 therapy (HCC) 05/04/2020  . Obesity hypoventilation syndrome (HCC) 05/04/2020  . LVH (left ventricular hypertrophy) 05/04/2020  . Pneumonia due to COVID-19 virus 05/03/2020  . Non-ischemic cardiomyopathy (HCC) 12/29/2019  . HFrEF (heart failure with reduced ejection fraction) (HCC) 11/25/2019  . UTI (urinary tract infection) 11/25/2019  . NSTEMI (non-ST elevated myocardial infarction) (HCC) 11/09/2019  . QT prolongation 11/09/2019  . Hyponatremia 11/03/2019  . Diffuse abdominal pain 11/03/2019  . Nausea and vomiting 11/03/2019  . Diminished pulses in lower extremity 08/11/2019  .  Osteopenia 08/11/2019  . Wound infection complicating hardware (HCC) 04/01/2019  . 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 Neurogenic urinary incontinence 11/29/2018  . Urinary incontinence 11/29/2018  . Bladder spasms 11/29/2018  . Discogenic low back pain 11/29/2018  . Chronic sacroiliac joint pain (Bilateral) (R>L) 11/29/2018  . Spondylosis without myelopathy or radiculopathy, lumbar region 11/09/2018  . Chronic low back pain (Bilateral) (L>R) w/o sciatica 11/09/2018  . History of hip replacement (Left) 10/13/2018  . History of back surgery 10/13/2018  . Vitamin D insufficiency 10/13/2018  . Osteoarthritis of facet joint of lumbar spine 10/13/2018  . Osteoarthritis involving multiple joints 10/13/2018  . Cervical radiculopathy (Left) 10/13/2018  . Lumbar facet arthropathy (Bilateral) 10/13/2018  . Lumbar facet syndrome (Bilateral) (L>R) 10/13/2018  . Abnormal MRI, lumbar spine (09/16/2018) 10/13/2018  . Lumbar nerve root compression (L4) (Left) 10/13/2018  . Lumbar foraminal stenosis 10/13/2018  . 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 Failed back surgical syndrome  10/12/2018  . Chronic low back pain (Primary Area of Pain) (Bilateral) (L>R) w/ sciatica (Left) 09/14/2018  . Chronic lower extremity pain (Secondary Area of Pain) (Left) 09/14/2018  . Chronic hip pain [facility] Area of Pain) (Bilateral) (L>R) 09/14/2018  . Chronic knee pain (Fourth Area of Pain) (Bilateral) (R>L) 09/14/2018  . Polypharmacy 09/14/2018  . Disorder of skeletal system 09/14/2018  . Problems influencing health status 09/14/2018  . Primary osteoarthritis of first carpometacarpal joint of left hand 04/20/2018  . 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 01/16/2018  . PAH (pulmonary artery hypertension) (HCC) 08/03/2017  . Advanced care planning/counseling discussion 06/25/2017  . Synovial cyst of lumbar facet joint 11/13/2016  . Thoracic aortic  atherosclerosis (HCC)   . CAD (coronary artery disease) 07/30/2016  . Centrilobular emphysema (HCC) 07/30/2016  . Ex-smoker 07/08/2016  . Nonspecific abnormal electrocardiogram (ECG) (EKG) 07/08/2016  . Chronic lumbar radicular pain (L4) (Bilateral) 06/09/2016  . DOE (dyspnea on exertion) 05/22/2016  . Degenerative joint disease (DJD) of hip 04/17/2016  . Somatic dysfunction of sacroiliac joint 04/17/2016  . S/P Lumbar Fusion (L3-4 PLIF) 04/17/2016  . Cervical fusion syndrome 04/17/2016  . Pedal edema 02/19/2016  . Chronic pain syndrome 02/19/2016  . Abdominal aortic atherosclerosis (HCC) 12/11/2015  . MRSA (methicillin resistant Staphylococcus aureus) infection 10/11/2012  . Urinary urgency 07/28/2012  . Infection or inflammatory reaction due to internal joint prosthesis (HCC) 12/03/2011  . Morbid obesity with BMI of 60.0-69.9, adult (HCC) 05/13/2011  . Essential hypertension 05/17/2010  . Cervical radiculopathy (Right) 12/25/2009  . Benign positional vertigo 07/21/2008  . Hyperlipidemia associated with type 2 diabetes mellitus (HCC) 08/16/2007  . Hypothyroidism 07/30/2007  . DM type 2 with diabetic peripheral neuropathy (HCC) 07/30/2007  . Normocytic anemia 07/30/2007  . Bipolar disorder (HCC) 07/30/2007  . Allergic rhinitis 07/30/2007  . Asthma 07/30/2007  . GERD 07/30/2007  . Osteoarthritis 07/30/2007  . OSA treated with BiPAP 07/30/2007    Conditions to be addressed/monitored:CHF and DMII  Care Plan : Heart Failure (Adult)  Updates made by Otho Ket, RN since 01/14/2021 12:00 AM  Problem: Disease Progression (Heart Failure)   Priority: High  Onset Date: 11/12/2020  Long-Range Goal: patient will verbalize heart failure home managment strategies   Start Date: 11/12/2020  Expected End Date: 03/28/2021  This Visit's Progress: On track  Recent Progress: On track  Priority: High  Current Barriers:  Marland Kitchen Knowledge deficit related to basic heart failure pathophysiology and  self care management:  Patient reports her doctor instructed her to take a potassium pill and extra fluid pill if she gains fluid weight (3-5 lbs)  . Cognitive Deficits: Patient reports having difficulty with focus/ concentration: Patient states he daughter continues to assist her with her medications.  . Unable to perform IADLs independently:  Patient reports she has ongoing assistance from her daughter when needed.  . Case Manager Clinical Goal(s):  . patient will verbalize understanding of Heart Failure Action Plan and when to call doctor:  Patient reports she is in the Tyriek Hofman zone today.  . patient will take all Heart Failure mediations as prescribed . patient will weigh daily and record (notifying MD of 3 lb weight gain over night or 5 lb in a week) . Patient will wear oxygen as advised by provider and check oxygen saturations daily.   Patient reports she is not using her oxygen around the clock, only as needed.  Patient states she wears her CPAP machine approximately 4 hours per night.  She states she  does not like the mask and states her headgear does not always stay fastened. Patient advised to contact the company providing the CPAP to discuss face mask options and headgear replacement.  Interventions:  . Collaboration with [name], MD regarding development and update of comprehensive plan of care as evidenced by provider attestation and co-signature . Inter-disciplinary care team collaboration (see longitudinal plan of care) . Provided verbal education on low sodium diet:  Patient reports she continues to adhere to a low sodium diet.   . Reviewed Heart Failure Action Plan in depth and provided written copy: Patient reports being in the Cherissa [place] zone today of the heart failure action plan. Discussed importance of daily weight and advised patient to weigh and record daily: Patient reports today's weight was 366.4 lbs.  . Reviewed role of diuretics in prevention of fluid overload and  management of heart failure: Patient reports having some swelling in her ankles. Advised patient to do ankle pumps and elevate legs when sitting.  Patient verbalized understanding and agreement.  Patient states she has her fluid medication and takes a prescribed.  . Patient Goals/Self-Care Activities . call office if I gain more than 3 pounds in one day or 5 pounds in one week (Follow doctor advisement for weight gain of 3 lbs overnight and 5 lbs in a week) . Continue to do ankle pumps when sitting . Continue to keep legs up while sitting . Continue to follow a low salt diet and adhere to fluid restriction recommendations.   . Continue to watch for swelling in feet, ankles and legs every day (report to doctor for increase in swelling, weight gain, and/ or shortness of breath). . Continue to weigh daily and record weights.   . Wear oxygen as advised by provider and check oxygen saturations daily . Call CPAP provider to discuss mask and headgear options/ replacement.  Follow Up Plan: The patient has been provided with contact information for the care management team and has been advised to call with any health related questions or concerns.  The care management team will reach out to the patient again over the next 45 days.      Care Plan : Diabetes Type 2 (Adult)  Updates made by Otho Ket, RN since 01/14/2021 12:00 AM  Problem: Glycemic Management (Diabetes, Type 2)   Priority: Medium  Long-Range Goal: Patient will verbalize home management strategies for diabetes   Start Date: 11/12/2020  Expected End Date: 03/28/2021  This Visit's Progress: On track  Recent Progress: On track  Priority: Medium  Objective:  Lab Results  Component Value Date   HGBA1C 6.5 (A) 08/10/2020 .   Lab Results  Component Value Date   CREATININE 0.83 06/15/2020   CREATININE 0.51 05/30/2020   CREATININE 0.62 05/22/2020   Current Barriers:  Marland Kitchen Knowledge Deficits related to basic Diabetes self  care/management:  Patient states she has not been very good with checking her blood sugars consistently.  She reports she adheres to a low carb/ diabetic diet at least 50% of the time.  Patient states she continues to take her diabetic medication as prescribed. Patient states she has been very busy assisting her daughter with her upcoming wedding.   Case Manager Clinical Goal(s):  . patient will demonstrate improved adherence to prescribed treatment plan for diabetes self care/management as evidenced by:  Interventions:  . Collaboration with [name], MD regarding development and update of comprehensive plan of care as evidenced by provider attestation and co-signature . Inter-disciplinary  care team collaboration (see longitudinal plan of care) . Reviewed medications with and discussed importance of medication adherence.   . Discussed plans with patient for ongoing care management follow up and provided patient with direct contact information for care management team . Patient will monitor and record blood sugars at least 2-3 times per week:  Patient denies checking her blood sugars 2-3 times per week. RNCM discussed with patient importance of monitoring blood sugars.  Discussed important of home managing health conditions. Patient verbalized understanding.  . Patient will continue to adhere to a ADA/carb modified diet.   Education article sent to patient regarding blood sugar monitoring.  Patient Goals/Self-Care Activities - Continue to check blood sugar at least 2-3 times per week:  - Check blood sugar if I feel it is too high or too low - Take the blood sugar meter to all doctor visits  - Continue to take medication as prescribed.  - Attend scheduled provider appointments - Continue to follow an ADA/carb modified Follow Up Plan: The patient has been provided with contact information for the care management team and has been advised to call with any health related questions or concerns.   The care management team will reach out to the patient again over the next 45 days.       Plan:The patient has been provided with contact information for the care management team and has been advised to call with any health related questions or concerns.  and The care management team will reach out to the patient again over the next 45 days.  George Ina RN,BSN,CCM RN Case Manager [facility] [place]  [phone]

## 2021-01-14 NOTE — Patient Instructions (Signed)
Visit Information:  Thank you for taking the time to speak with me today.   PATIENT GOALS: Goals Addressed            This Visit's Progress   . Monitor and Manage My Blood Sugar-Diabetes Type 2   On track    Timeframe:  Long-Range Goal Priority:  Medium Start Date:  11/12/2020                           Expected End Date: 03/28/2021                      Follow Up Date  02/18/2021     - Continue to check blood sugar at least 2-3 times per week: Review treatment plan for hyperglycemia (high ) and hypoglycemia (low) as indicated on your after visit summary.  - Check blood sugar if I feel it is too high or too low - Take the blood sugar meter to all doctor visits  - Continue to take medication as prescribed.  - Attend scheduled provider appointments - Continue to follow an ADA/carb modified - Review article on blood sugar monitoring located in your After visit summary.   Why is this important?    Checking your blood sugar at home helps to keep it from getting very high or very low.   Writing the results in a diary or log helps the doctor know how to care for you.   Your blood sugar log should have the time, date and the results.   Also, write down the amount of insulin or other medicine that you take.   Other information, like what you ate, exercise done and how you were feeling, will also be helpful.         . Track and Manage Fluids and Swelling-Heart Failure   On track    Timeframe:  Long-Range Goal Priority:  High Start Date:  11/12/2020                           Expected End Date: 03/28/2021                    Follow Up Date 02/18/2021   . call office if I gain more than 3 pounds in one day or 5 pounds in one week (Follow doctor advisement for weight gain of 3 lbs overnight and 5 lbs in a week) . Continue to do ankle pumps when sitting . Continue to keep legs up while sitting . Continue to follow a low salt diet and adhere to fluid restriction recommendations.    . Continue to watch for swelling in feet, ankles and legs every day (report to doctor for increase in swelling, weight gain, and/ or shortness of breath). . Continue to weigh daily and record weights.   . Wear oxygen as advised by provider and check oxygen saturations daily . Call CPAP provider to discuss mask and headgear options/ replacement.  .  Why is this important?    It is important to check your weight daily and watch how much salt and liquids you have.   It will help you to manage your heart failure.    Notes:        Blood Glucose Monitoring, Adult Monitoring your blood sugar (glucose) is an important part of managing your diabetes. Blood glucose monitoring involves checking your blood glucose as often as directed and  keeping a log or record of your results over time. Checking your blood glucose regularly and keeping a blood glucose log can:  Help you and your health care provider adjust your diabetes management plan as needed, including your medicines or insulin.  Help you understand how food, exercise, illnesses, and medicines affect your blood glucose.  Let you know what your blood glucose is at any time. You can quickly find out if you have low blood glucose (hypoglycemia) or high blood glucose (hyperglycemia). Your health care provider will set individualized treatment goals for you. Your goals will be based on your age, other medical conditions you have, and how you respond to diabetes treatment. Generally, the goal of treatment is to maintain the following blood glucose levels:  Before meals (preprandial): 80-130 mg/dL (3.8-8.8 mmol/L).  After meals (postprandial): below 180 mg/dL (10 mmol/L).  A1C level: less than 7%. Supplies needed:  Blood glucose meter.  Test strips for your meter. Each meter has its own strips. You must use the strips that came with your meter.  A needle to prick your finger (lancet). Do not use a lancet more than one time.  A device  that holds the lancet (lancing device).  A journal or log book to write down your results. How to check your blood glucose Checking your blood glucose 1. Wash your hands for at least 20 seconds with soap and water. 2. Prick the side of your finger (not the tip) with the lancet. Do not use the same finger consecutively. 3. Gently rub the finger until a small drop of blood appears. 4. Follow instructions that come with your meter for inserting the test strip, applying blood to the strip, and using your blood glucose meter. 5. Write down your result and any notes in your log.   Using alternative sites Some meters allow you to use areas of your body other than your finger (alternative sites) to test your blood. The most common alternative sites are the forearm, the thigh, and the palm of your hand. Alternative sites may not be as accurate as the fingers because blood flow is slower in those areas. This means that the result you get may be delayed, and it may be different from the result that you would get from your finger. Use the finger only, and do not use alternative sites, if:  You think you have hypoglycemia.  You sometimes do not know that your blood glucose is getting low (hypoglycemia unawareness). General tips and recommendations Blood glucose log  Every time you check your blood glucose, write down your result. Also write down any notes about things that may be affecting your blood glucose, such as your diet and exercise for the day. This information can help you and your health care provider: ? Look for patterns in your blood glucose over time. ? Adjust your diabetes management plan as needed.  Check if your meter allows you to download your records to a computer or if there is an app for the meter. Most glucose meters store a record of glucose readings in the meter.   If you have type 1 diabetes:  Check your blood glucose 4 or more times a day if you are on intensive insulin  therapy with multiple daily injections (MDI) or if you are using an insulin pump. Check your blood glucose: ? Before every meal and snack. ? Before bedtime.  Also check your blood glucose: ? If you have symptoms of hypoglycemia. ? After treating low blood  glucose. ? Before doing activities that create a risk for injury, like driving or using machinery. ? Before and after exercise. ? Two hours after a meal. ? Occasionally between 2:00 a.m. and 3:00 a.m., as directed.  You may need to check your blood glucose more often, 6-10 times per day, if: ? You have diabetes that is not well controlled. ? You are ill. ? You have a history of severe hypoglycemia. ? You have hypoglycemia unawareness. If you have type 2 diabetes:  Check your blood glucose 2 or more times a day if you take insulin or other diabetes medicines.  Check your blood glucose 4 or more times a day if you are on intensive insulin therapy. Occasionally, you may also need to check your glucose between 2:00 a.m. and 3:00 a.m., as directed.  Also check your blood glucose: ? Before and after exercise. ? Before doing activities that create a risk for injury, like driving or using machinery.  You may need to check your blood glucose more often if: ? Your medicine is being adjusted. ? Your diabetes is not well controlled. ? You are ill. General tips  Make sure you always have your supplies with you.  After you use a few boxes of test strips, adjust (calibrate) your blood glucose meter by following instructions that came with your meter.  If you have questions or need help, all blood glucose meters have a 24-hour hotline phone number available that you can call. Also contact your health care provider with questions or concerns you may have. Where to find more information  The American Diabetes Association: www.diabetes.org  The Association of Diabetes Care & Education Specialists: www.diabeteseducator.org Contact a health  care provider if:  Your blood glucose is at or above 240 mg/dL (42.7 mmol/L) for 2 days in a row.  You have been sick or have had a fever for 2 days or longer, and you are not getting better.  You have any of the following problems for more than 6 hours: ? You cannot eat or drink. ? You have nausea or vomiting. ? You have diarrhea. Get help right away if:  Your blood glucose is lower than 54 mg/dL (3 mmol/L).  You become confused, or you have trouble thinking clearly.  You have difficulty breathing.  You have moderate or large ketone levels in your urine. These symptoms may represent a serious problem that is an emergency. Do not wait to see if the symptoms will go away. Get medical help right away. Call your local emergency services (911 in the [place] Do not drive yourself to the hospital. Summary  Monitoring your blood glucose is an important part of managing your diabetes.  Blood glucose monitoring involves checking your blood glucose as often as directed and keeping a log or record of your results over time.  Your health care provider will set individualized treatment goals for you. Your goals will be based on your age, other medical conditions you have, and how you respond to diabetes treatment.  Every time you check your blood glucose, write down your result. Also, write down any notes about things that may be affecting your blood glucose, such as your diet and exercise for the day. This information is not intended to replace advice given to you by your health care provider. Make sure you discuss any questions you have with your health care provider. Document Revised: 06/13/2020 Document Reviewed: 06/13/2020 Elsevier Patient Education  2021 [employer]  Patient verbalizes understanding of instructions  provided today and agrees to view in MyChart.   The patient has been provided with contact information for the care management team and has been advised to call with any  health related questions or concerns.  The care management team will reach out to the patient again over the next 45 days.   George Ina RN,BSN,CCM RN Case Manager [facility] [place]  [phone]

## 2021-01-18 ENCOUNTER — Other Ambulatory Visit: Payer: Self-pay | Admitting: Family Medicine

## 2021-01-31 ENCOUNTER — Other Ambulatory Visit: Payer: Self-pay | Admitting: Family Medicine

## 2021-01-31 DIAGNOSIS — R6889 Other general symptoms and signs: Secondary | ICD-10-CM

## 2021-01-31 DIAGNOSIS — E785 Hyperlipidemia, unspecified: Secondary | ICD-10-CM

## 2021-01-31 DIAGNOSIS — E559 Vitamin D deficiency, unspecified: Secondary | ICD-10-CM

## 2021-01-31 DIAGNOSIS — E039 Hypothyroidism, unspecified: Secondary | ICD-10-CM

## 2021-01-31 DIAGNOSIS — E1142 Type 2 diabetes mellitus with diabetic polyneuropathy: Secondary | ICD-10-CM

## 2021-01-31 DIAGNOSIS — D649 Anemia, unspecified: Secondary | ICD-10-CM

## 2021-02-01 ENCOUNTER — Other Ambulatory Visit (INDEPENDENT_AMBULATORY_CARE_PROVIDER_SITE_OTHER): Payer: Medicare Other

## 2021-02-01 ENCOUNTER — Ambulatory Visit: Payer: Medicare Other

## 2021-02-01 ENCOUNTER — Other Ambulatory Visit: Payer: Self-pay

## 2021-02-01 DIAGNOSIS — E785 Hyperlipidemia, unspecified: Secondary | ICD-10-CM

## 2021-02-01 DIAGNOSIS — E039 Hypothyroidism, unspecified: Secondary | ICD-10-CM | POA: Diagnosis not present

## 2021-02-01 DIAGNOSIS — E1142 Type 2 diabetes mellitus with diabetic polyneuropathy: Secondary | ICD-10-CM | POA: Diagnosis not present

## 2021-02-01 DIAGNOSIS — E559 Vitamin D deficiency, unspecified: Secondary | ICD-10-CM | POA: Diagnosis not present

## 2021-02-01 DIAGNOSIS — E1169 Type 2 diabetes mellitus with other specified complication: Secondary | ICD-10-CM

## 2021-02-01 DIAGNOSIS — R6889 Other general symptoms and signs: Secondary | ICD-10-CM | POA: Diagnosis not present

## 2021-02-01 DIAGNOSIS — D649 Anemia, unspecified: Secondary | ICD-10-CM

## 2021-02-01 LAB — CBC WITH DIFFERENTIAL/PLATELET
Basophils Absolute: 0 10*3/uL (ref 0.0–0.1)
Basophils Relative: 0.7 % (ref 0.0–3.0)
Eosinophils Absolute: 0.1 10*3/uL (ref 0.0–0.7)
Eosinophils Relative: 2.3 % (ref 0.0–5.0)
HCT: 32 % — ABNORMAL LOW (ref 36.0–46.0)
Hemoglobin: 10.6 g/dL — ABNORMAL LOW (ref 12.0–15.0)
Lymphocytes Relative: 21.4 % (ref 12.0–46.0)
Lymphs Abs: 1.4 10*3/uL (ref 0.7–4.0)
MCHC: 33.2 g/dL (ref 30.0–36.0)
MCV: 84.5 fl (ref 78.0–100.0)
Monocytes Absolute: 0.5 10*3/uL (ref 0.1–1.0)
Monocytes Relative: 7.3 % (ref 3.0–12.0)
Neutro Abs: 4.3 10*3/uL (ref 1.4–7.7)
Neutrophils Relative %: 68.3 % (ref 43.0–77.0)
Platelets: 237 10*3/uL (ref 150.0–400.0)
RBC: 3.79 Mil/uL — ABNORMAL LOW (ref 3.87–5.11)
RDW: 16.9 % — ABNORMAL HIGH (ref 11.5–15.5)
WBC: 6.3 10*3/uL (ref 4.0–10.5)

## 2021-02-01 LAB — LIPID PANEL
Cholesterol: 107 mg/dL (ref 0–200)
HDL: 50.1 mg/dL (ref >39.00)
LDL Cholesterol: 39 mg/dL (ref 0–99)
NonHDL: 57.16
Total CHOL/HDL Ratio: 2
Triglycerides: 93 mg/dL (ref 0.0–149.0)
VLDL: 18.6 mg/dL (ref 0.0–40.0)

## 2021-02-01 LAB — IBC PANEL
Iron: 33 ug/dL — ABNORMAL LOW (ref 42–145)
Saturation Ratios: 8.3 % — ABNORMAL LOW (ref 20.0–50.0)
Transferrin: 285 mg/dL (ref 212.0–360.0)

## 2021-02-01 LAB — COMPREHENSIVE METABOLIC PANEL
ALT: 22 U/L (ref 0–35)
AST: 18 U/L (ref 0–37)
Albumin: 4 g/dL (ref 3.5–5.2)
Alkaline Phosphatase: 73 U/L (ref 39–117)
BUN: 24 mg/dL — ABNORMAL HIGH (ref 6–23)
CO2: 29 mEq/L (ref 19–32)
Calcium: 9.9 mg/dL (ref 8.4–10.5)
Chloride: 100 mEq/L (ref 96–112)
Creatinine, Ser: 0.62 mg/dL (ref 0.40–1.20)
GFR: 91.35 mL/min (ref >60.00)
Glucose, Bld: 170 mg/dL — ABNORMAL HIGH (ref 70–99)
Potassium: 4.2 mEq/L (ref 3.5–5.1)
Sodium: 138 mEq/L (ref 135–145)
Total Bilirubin: 0.4 mg/dL (ref 0.2–1.2)
Total Protein: 6.2 g/dL (ref 6.0–8.3)

## 2021-02-01 LAB — FOLATE: Folate: >23.6 ng/mL (ref >5.9)

## 2021-02-01 LAB — VITAMIN B12: Vitamin B-12: 588 pg/mL (ref 211–911)

## 2021-02-01 LAB — HEMOGLOBIN A1C: Hgb A1c MFr Bld: 7.9 % — ABNORMAL HIGH (ref 4.6–6.5)

## 2021-02-01 LAB — VITAMIN D 25 HYDROXY (VIT D DEFICIENCY, FRACTURES): VITD: 24.61 ng/mL — ABNORMAL LOW (ref 30.00–100.00)

## 2021-02-01 LAB — FERRITIN: Ferritin: 13.3 ng/mL (ref 10.0–291.0)

## 2021-02-01 LAB — TSH: TSH: 2.46 u[IU]/mL (ref 0.35–4.50)

## 2021-02-01 NOTE — Addendum Note (Signed)
Addended by: Aquilla Solian on: 02/01/2021 05:00 PM   Modules accepted: Orders

## 2021-02-01 NOTE — Addendum Note (Signed)
Addended by: Aquilla Solian on: 02/01/2021 04:59 PM   Modules accepted: Orders

## 2021-02-02 LAB — MICROALBUMIN / CREATININE URINE RATIO
Creatinine, Urine: 19 mg/dL — ABNORMAL LOW (ref 20–275)
Microalb Creat Ratio: 42 mcg/mg creat — ABNORMAL HIGH (ref <30)
Microalb, Ur: 0.8 mg/dL

## 2021-02-07 NOTE — Telephone Encounter (Signed)
Patient has had follow up since message 

## 2021-02-08 ENCOUNTER — Other Ambulatory Visit: Payer: Self-pay

## 2021-02-08 ENCOUNTER — Ambulatory Visit (INDEPENDENT_AMBULATORY_CARE_PROVIDER_SITE_OTHER): Payer: Medicare Other | Admitting: Family Medicine

## 2021-02-08 ENCOUNTER — Encounter: Payer: Self-pay | Admitting: Family Medicine

## 2021-02-08 VITALS — BP 134/84 | HR 81 | Temp 97.7°F

## 2021-02-08 DIAGNOSIS — I10 Essential (primary) hypertension: Secondary | ICD-10-CM

## 2021-02-08 DIAGNOSIS — Z0000 Encounter for general adult medical examination without abnormal findings: Secondary | ICD-10-CM | POA: Diagnosis not present

## 2021-02-08 DIAGNOSIS — J9611 Chronic respiratory failure with hypoxia: Secondary | ICD-10-CM

## 2021-02-08 DIAGNOSIS — M545 Low back pain, unspecified: Secondary | ICD-10-CM | POA: Diagnosis not present

## 2021-02-08 DIAGNOSIS — E785 Hyperlipidemia, unspecified: Secondary | ICD-10-CM

## 2021-02-08 DIAGNOSIS — M961 Postlaminectomy syndrome, not elsewhere classified: Secondary | ICD-10-CM

## 2021-02-08 DIAGNOSIS — Z7189 Other specified counseling: Secondary | ICD-10-CM | POA: Diagnosis not present

## 2021-02-08 DIAGNOSIS — F3110 Bipolar disorder, current episode manic without psychotic features, unspecified: Secondary | ICD-10-CM

## 2021-02-08 DIAGNOSIS — K219 Gastro-esophageal reflux disease without esophagitis: Secondary | ICD-10-CM

## 2021-02-08 DIAGNOSIS — R32 Unspecified urinary incontinence: Secondary | ICD-10-CM

## 2021-02-08 DIAGNOSIS — E662 Morbid (severe) obesity with alveolar hypoventilation: Secondary | ICD-10-CM

## 2021-02-08 DIAGNOSIS — G4733 Obstructive sleep apnea (adult) (pediatric): Secondary | ICD-10-CM

## 2021-02-08 DIAGNOSIS — Z6841 Body Mass Index (BMI) 40.0 and over, adult: Secondary | ICD-10-CM

## 2021-02-08 DIAGNOSIS — G894 Chronic pain syndrome: Secondary | ICD-10-CM

## 2021-02-08 DIAGNOSIS — E039 Hypothyroidism, unspecified: Secondary | ICD-10-CM | POA: Diagnosis not present

## 2021-02-08 DIAGNOSIS — I700 Atherosclerosis of aorta: Secondary | ICD-10-CM

## 2021-02-08 DIAGNOSIS — E559 Vitamin D deficiency, unspecified: Secondary | ICD-10-CM

## 2021-02-08 DIAGNOSIS — E1169 Type 2 diabetes mellitus with other specified complication: Secondary | ICD-10-CM | POA: Diagnosis not present

## 2021-02-08 DIAGNOSIS — Z79899 Other long term (current) drug therapy: Secondary | ICD-10-CM

## 2021-02-08 DIAGNOSIS — I5020 Unspecified systolic (congestive) heart failure: Secondary | ICD-10-CM

## 2021-02-08 DIAGNOSIS — M85851 Other specified disorders of bone density and structure, right thigh: Secondary | ICD-10-CM

## 2021-02-08 DIAGNOSIS — E1142 Type 2 diabetes mellitus with diabetic polyneuropathy: Secondary | ICD-10-CM | POA: Diagnosis not present

## 2021-02-08 DIAGNOSIS — Z9981 Dependence on supplemental oxygen: Secondary | ICD-10-CM

## 2021-02-08 DIAGNOSIS — J4520 Mild intermittent asthma, uncomplicated: Secondary | ICD-10-CM

## 2021-02-08 DIAGNOSIS — D649 Anemia, unspecified: Secondary | ICD-10-CM

## 2021-02-08 DIAGNOSIS — R3915 Urgency of urination: Secondary | ICD-10-CM

## 2021-02-08 DIAGNOSIS — G8929 Other chronic pain: Secondary | ICD-10-CM

## 2021-02-08 DIAGNOSIS — J432 Centrilobular emphysema: Secondary | ICD-10-CM

## 2021-02-08 DIAGNOSIS — I2721 Secondary pulmonary arterial hypertension: Secondary | ICD-10-CM

## 2021-02-08 DIAGNOSIS — Z87891 Personal history of nicotine dependence: Secondary | ICD-10-CM

## 2021-02-08 MED ORDER — VITAMIN D 50 MCG (2000 UT) PO CAPS: 1.0000 | ORAL_CAPSULE | Freq: Every day | ORAL | Status: AC

## 2021-02-08 MED ORDER — FERROUS SULFATE 325 (65 FE) MG PO TBEC: 325.0000 mg | DELAYED_RELEASE_TABLET | ORAL | Status: AC

## 2021-02-08 NOTE — Progress Notes (Signed)
Patient ID: Amy Barnes, female    DOB: 02/26/1951, 70 y.o.   MRN: [id number]  This visit was conducted in person.  BP 134/84   Pulse 81   Temp 97.7 F (36.5 C) (Temporal)   LMP  (LMP Unknown)   SpO2 97% Comment: 2 L   CC: AMW  Subjective:   HPI: Amy Barnes is a 70 y.o. female presenting on 02/08/2021 for Medicare Wellness (Pt accompanied by daughter, [name]- temp 98.0.)   Did not see health advisor this year.    Hearing Screening   '125Hz'$  $Remo'250Hz'rskyR$'500Hz'$'1000Hz'$'2000Hz'$'3000Hz'$'4000Hz'$'6000Hz'$'8000Hz'$   Right ear:   0 40 40  40    Left ear:   '25 25 25  '$ 40    Vision Screening Comments: Last eye exam, 11/2020.   [place] Office Visit from 02/08/2021 in [place] at [facility] Total Score 3    Noted R hearing loss on exam today - declines audiology eval.  Fall Risk  02/08/2021 11/23/2020 11/12/2020 07/19/2020 03/21/2020  Falls in the past year? $RemoveBe'1 1 1 'tQRhGlChX$ 0 1  Number falls in past yr: $Remove'1 1 1 'mwUMQLq$ - 1  Injury with Fall? 1 1 0 - 0  Comment Head injury - - - -  Risk for fall due to : - - History of fall(s);Impaired balance/gait;Impaired mobility No Fall Risks -  Follow up - - Falls prevention discussed;Education provided Falls evaluation completed -  Suffered fall this past fall 2021 - fortunately no injury.   H/o HFrEF 10/2019 followed by Dr Percival Spanish on entresto, lasix, carvedilol. EF improved on latest echocardiogram 01/2020. Continues lasix $RemoveBeforeD'40mg'kbyBNmoVAhCNxg$  daily with 19mEq potassium.   OHVS with OSA on BiPAP followed by Dr Claiborne Billings. Needs new mask.   MRSA lumbar HW infection - followed by ID Baxter Flattery) - planning to continue doxy $RemoveBefor'100mg'EfUgZBDoYCPR$  bid for extended period, drops dose when she has yeast infection, and uses fluconazole PRN yeast infections.   Preventative: COLONOSCOPY WITH PROPOFOL 12/31/2015;diverticulosis, int hem, o/w normal rpt 10 yrs ([place]). Breast cancer screening -07/2019 [facility] Well woman exam -h/o partial hysterectomy for menorrhagia, ovaries remain.No pelvic pain/pressure or  bleeding.  Lung cancer screening -ex smoker, quit 2010. ~40 PY hx. Undergoing LRCT scans - last 08/2020. DEXA 07/2019 - T -1.1 R femur (osteopenia). Discussed calcium in diet. Discussed vit D. Flu shot -yearly  COVID vaccine - declines Td-2008  Pneumovax 2013, prevnar2019 (large local reaction after this). Pneumovax 12/2019  Zostavax-2015  shingrix - discussed- to check with insurance.  Advanced directive: scanned 05/2017. Husband Bobby Rumpf is HCPOA. Does not want CPR, DNR goldenrod form provided last year for home. does not want prolonged life support Seat belt use discussed Sunscreen usediscussed. No changing moles on skin. 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 Interim medical history since our last visit reviewed. Allergies and medications reviewed and updated. Outpatient Medications Prior to Visit  Medication Sig Dispense Refill  . albuterol (PROVENTIL HFA;VENTOLIN HFA) 108 (90 Base) MCG/ACT inhaler Inhale 2 puffs into the lungs every 6 (six) hours as  needed for wheezing or shortness of breath. 1 Inhaler 6  . aspirin 81 MG EC tablet Take 81 mg by mouth at bedtime.    . budesonide-formoterol (SYMBICORT) 160-4.5 MCG/ACT inhaler Inhale 2 puffs into the lungs 2 (two) times daily. (Patient taking differently: Inhale 2 puffs into the lungs 2 (two) times daily. Pt uses as needed.) 1 Inhaler 3  . carvedilol (COREG) 6.25 MG tablet Take 1 tablet (6.25 mg total) by mouth 2 (two) times daily. 180 tablet 3  . dicyclomine (BENTYL) 20 MG tablet Take 1 tablet (20  mg total) by mouth in the morning and at bedtime. (Patient taking differently: Take 20 mg by mouth in the morning and at bedtime. As needed) 60 tablet 2  . doxycycline (VIBRA-TABS) 100 MG tablet TAKE ONE TABLET BY MOUTH TWICE A DAY 180 tablet 3  . fluconazole (DIFLUCAN) 200 MG tablet Take 200 mg by mouth daily. Daughter states patient is taking 1 in am    . FLUoxetine (PROZAC) 40 MG capsule Take 40 mg by mouth daily.     . furosemide (LASIX) 20 MG tablet Take 2 tablets (40 mg total) by mouth daily. And as directed 180 tablet 3  . gabapentin (NEURONTIN) 100 MG capsule TAKE ONE TO TWO CAPSULES BY MOUTH AT BEDTIME 60 capsule 1  . lamoTRIgine (LAMICTAL) 200 MG tablet Take 400 mg by mouth daily.    Marland Kitchen levothyroxine (SYNTHROID) 150 MCG tablet TAKE ONE TABLET BY MOUTH AT BEDTIME (Patient taking differently: Take 150 mcg by mouth daily before breakfast. Pt takes at bedtime.) 90 tablet 3  . metFORMIN (GLUCOPHAGE) 500 MG tablet Take 1 tablet (500 mg total) by mouth daily with breakfast. (Patient taking differently: Take 500 mg by mouth daily with breakfast. Pt takes at bedtime) 90 tablet 3  . montelukast (SINGULAIR) 10 MG tablet TAKE ONE TABLET BY MOUTH AT BEDTIME 90 tablet 0  . Multiple Vitamin (MULTIVITAMIN WITH MINERALS) TABS tablet Take 2 tablets by mouth daily.     . ondansetron (ZOFRAN ODT) 8 MG disintegrating tablet Take 1 tablet (8 mg total) by mouth 2 (two) times daily as needed for nausea or vomiting. 10 tablet 0  . Oxcarbazepine (TRILEPTAL) 300 MG tablet Take 300 mg by mouth 2 (two) times daily. Pt takes 1 tablet at bedtime    . pantoprazole (PROTONIX) 40 MG tablet TAKE ONE TABLET BY MOUTH TWICE A DAY 180 tablet 3  . polyethylene glycol (MIRALAX / GLYCOLAX) 17 g packet Take 17 g by mouth 2 (two) times daily as needed for mild constipation. 14 each 0  . potassium chloride (KLOR-CON) 10 MEQ tablet Take 1 tablet (10 mEq total) by mouth daily. With lasix. May take an additional 10 meq when taking 60 meq  of lasix 40 tablet 6  . prochlorperazine (COMPAZINE) 10 MG tablet Take 1 tablet (10 mg total) by mouth every 6 (six) hours as needed for nausea or vomiting. 30 tablet 1  . promethazine (PHENERGAN) 25 MG suppository Place 1 suppository (25 mg total) rectally every 6 (six) hours as needed for nausea or vomiting. 12 each 0  . simvastatin (ZOCOR) 40 MG tablet TAKE ONE TABLET BY MOUTH EVERY EVENING 90 tablet 3  . sucralfate (CARAFATE) 1 GM/10ML suspension Take 10 mLs (1 g total) by mouth 4 (four) times daily -  with meals and at bedtime. (Patient taking differently: Take 1 g by mouth 4 (four) times daily -  with meals and at bedtime. Pt takes as needed.) 420 mL 0  No facility-administered medications prior to visit.     Per HPI unless specifically indicated in ROS section below Review of Systems Objective:  BP 134/84   Pulse 81   Temp 97.7 F (36.5 C) (Temporal)   LMP  (LMP Unknown)   SpO2 97% Comment: 2 L  Wt Readings from Last 3 Encounters:  12/12/20 (!) 371 lb (168.3 kg)  12/11/20 (!) 362 lb (164.2 kg)  11/23/20 (!) 362 lb (164.2 kg)      Physical Exam Vitals and nursing note reviewed.  Constitutional:      General: She is not in acute distress.    Appearance: Normal appearance. She is well-developed. She is obese. She is not ill-appearing.     Comments: Sitting in wheelchair, supplemental oxygen by [place]  HENT:     Head: Normocephalic and atraumatic.     Right Ear: Hearing, tympanic membrane, ear canal and external ear normal.     Left Ear: Hearing, tympanic membrane, ear canal and external ear normal.     Nose: Nose normal.     Mouth/Throat:     Pharynx: Uvula midline. No oropharyngeal exudate or posterior oropharyngeal erythema.  Eyes:     General: No scleral icterus.    Conjunctiva/sclera: Conjunctivae normal.     Pupils: Pupils are equal, round, and reactive to light.  Cardiovascular:     Rate and Rhythm: Normal rate and regular rhythm.     Pulses:          Radial pulses  are 2+ on the right side and 2+ on the left side.     Heart sounds: Normal heart sounds. No murmur heard.   Pulmonary:     Effort: Pulmonary effort is normal. No respiratory distress.     Breath sounds: Normal breath sounds. No wheezing or rales.  Abdominal:     General: Bowel sounds are normal. There is no distension.     Palpations: Abdomen is soft. There is no mass.     Tenderness: There is no abdominal tenderness. There is no guarding or rebound.  Musculoskeletal:        General: Normal range of motion.     Cervical back: Normal range of motion and neck supple.  Lymphadenopathy:     Cervical: No cervical adenopathy.  Skin:    General: Skin is warm and dry.     Findings: No rash.  Neurological:     General: No focal deficit present.     Mental Status: She is alert and oriented to person, place, and time.     Comments:  CN grossly intact, station and gait intact Recall 3/3  Calculation 5/5 DLROW  Psychiatric:        Mood and Affect: Mood normal.        Behavior: Behavior normal.        Thought Content: Thought content normal.        Judgment: Judgment normal.       Results for orders placed or performed in visit on 02/01/21  Folate  Result Value Ref Range   Folate >23.6 >5.9 ng/mL  Vitamin B12  Result Value Ref Range   Vitamin B-12 588 211 - 911 pg/mL  IBC panel  Result Value Ref Range   Iron 33 (L) 42 - 145 ug/dL   Transferrin 285.0 212.0 - 360.0 mg/dL   Saturation Ratios 8.3 (L) 20.0 - 50.0 %  Ferritin  Result Value Ref Range   Ferritin 13.3 10.0 - 291.0 ng/mL  CBC with  Differential/Platelet  Result Value Ref Range   WBC 6.3 4.0 - 10.5 K/uL   RBC 3.79 (L) 3.87 - 5.11 Mil/uL   Hemoglobin 10.6 (L) 12.0 - 15.0 g/dL   HCT 32.0 (L) 36.0 - 46.0 %   MCV 84.5 78.0 - 100.0 fl   MCHC 33.2 30.0 - 36.0 g/dL   RDW 16.9 (H) 11.5 - 15.5 %   Platelets 237.0 150.0 - 400.0 K/uL   Neutrophils Relative % 68.3 43.0 - 77.0 %   Lymphocytes Relative 21.4 12.0 - 46.0 %    Monocytes Relative 7.3 3.0 - 12.0 %   Eosinophils Relative 2.3 0.0 - 5.0 %   Basophils Relative 0.7 0.0 - 3.0 %   Neutro Abs 4.3 1.4 - 7.7 K/uL   Lymphs Abs 1.4 0.7 - 4.0 K/uL   Monocytes Absolute 0.5 0.1 - 1.0 K/uL   Eosinophils Absolute 0.1 0.0 - 0.7 K/uL   Basophils Absolute 0.0 0.0 - 0.1 K/uL  Hemoglobin A1c  Result Value Ref Range   Hgb A1c MFr Bld 7.9 (H) 4.6 - 6.5 %  TSH  Result Value Ref Range   TSH 2.46 0.35 - 4.50 uIU/mL  Comprehensive metabolic panel  Result Value Ref Range   Sodium 138 135 - 145 mEq/L   Potassium 4.2 3.5 - 5.1 mEq/L   Chloride 100 96 - 112 mEq/L   CO2 29 19 - 32 mEq/L   Glucose, Bld 170 (H) 70 - 99 mg/dL   BUN 24 (H) 6 - 23 mg/dL   Creatinine, Ser 0.62 0.40 - 1.20 mg/dL   Total Bilirubin 0.4 0.2 - 1.2 mg/dL   Alkaline Phosphatase 73 39 - 117 U/L   AST 18 0 - 37 U/L   ALT 22 0 - 35 U/L   Total Protein 6.2 6.0 - 8.3 g/dL   Albumin 4.0 3.5 - 5.2 g/dL   GFR 91.35 >60.00 mL/min   Calcium 9.9 8.4 - 10.5 mg/dL  Lipid panel  Result Value Ref Range   Cholesterol 107 0 - 200 mg/dL   Triglycerides 93.0 0.0 - 149.0 mg/dL   HDL 50.10 >39.00 mg/dL   VLDL 18.6 0.0 - 40.0 mg/dL   LDL Cholesterol 39 0 - 99 mg/dL   Total CHOL/HDL Ratio 2    NonHDL 57.16   VITAMIN D 25 Hydroxy (Vit-D Deficiency, Fractures)  Result Value Ref Range   VITD 24.61 (L) 30.00 - 100.00 ng/mL  Microalbumin / creatinine urine ratio  Result Value Ref Range   Creatinine, Urine 19 (L) 20 - 275 mg/dL   Microalb, Ur 0.8 mg/dL   Microalb Creat Ratio 42 (H) <30 mcg/mg creat   Depression screen [facility] 2/9 02/08/2021 11/23/2020 11/12/2020 07/19/2020 01/20/2020  Decreased Interest _0 0 1  Down, Depressed, Hopeless _1 0 1  PHQ - 2 Score _2 0 2  Altered sleeping _3 - 0  Tired, decreased energy _4 - 0  Change in appetite _5 - 0  Feeling bad or failure about yourself  1 0 3 - 0  Trouble concentrating 2 0 1 - 0  Moving slowly or fidgety/restless 0 0 1 - 0  Suicidal thoughts 0 0  1 - 0  PHQ-9 Score _6 - 2  Difficult doing work/chores - - Somewhat difficult - Not difficult at all  Some recent data might be hidden    GAD 7 : Generalized Anxiety Score 02/08/2021  Nervous, Anxious, on Edge  3  Control/stop worrying 0  Worry too much - different things 0  Trouble relaxing 0  Restless 0  Easily annoyed or irritable 0  Afraid - awful might happen 0  Total GAD 7 Score 3   Assessment & Plan:  This visit occurred during the SARS-CoV-2 public health emergency.  Safety protocols were in place, including screening questions prior to the visit, additional usage of staff PPE, and extensive cleaning of exam room while observing appropriate contact time as indicated for disinfecting solutions.   Problem List Items Addressed This Visit    Chronic pain syndrome (Chronic)   Failed back surgical syndrome (Chronic)   Chronic low back pain (Bilateral) (L>R) w/o sciatica (Chronic)   Urinary incontinence (Chronic)    Chronic, ongoing. Bad reaction to [place] or Detrol, prefers not to use oxybutynin she has at home. Could consider trial myrbetriq.       Hypothyroidism    Thyroid stable on levothyroxine 119mcg daily      DM type 2 with diabetic peripheral neuropathy ([place])    Deteriorated control - attributes to poor diet recently - may recent celebrations including daughter's wedding. Will renew efforts towards better glycemic control.       Hyperlipidemia associated with type 2 diabetes mellitus ([place])    Chronic, continue simvastatin The ASCVD Risk score Mikey Bussing DC Jr., et al., 2013) failed to calculate for the following reasons:   The patient has a prior MI or stroke diagnosis       Normocytic anemia    Progressive mild anemia noted - recent anemia panel showing low iron levels, normal B12 and folate - will start oral iron MWF, will send iFOB. ?anemia of chronic disease.       Relevant Medications   ferrous sulfate 325 (65 FE) MG EC tablet   Other Relevant Orders    Fecal occult blood, imunochemical   Bipolar disorder (HCC)    Continue prozac and mood stabilizers through psychiatrist       Essential hypertension    Chronic, stable on carvedilol and lasix.  Consider restarting ARB - see below.       Asthma    Continue symbicort, singulair, albuterol PRN       GERD    Continue pantoprazole 40mg  daily.       OSA treated with BiPAP    With [facility] - continue BiPAP      Morbid obesity with BMI of 60.0-69.9, adult (HCC)   Aortic atherosclerosis (HCC)    Continue statin, aspirin.       Ex-smoker    Quit smoking 2010.  Continues lung cancer screening program.      Centrilobular emphysema ([place])    Reviewed BID symbicort use       Advanced care planning/counseling discussion    Advanced directive: scanned 05/2017. Husband Bobby Rumpf is HCPOA. Does not want CPR, DNR goldenrod form provided last year for home. Does not want prolonged life support      PAH (pulmonary artery hypertension) (HCC)   Polypharmacy   Vitamin D insufficiency    Levels low - restart 1000-2000 IU daily.       Osteopenia    UTD dexa. Restart vit D replacement.       HFrEF (heart failure with reduced ejection fraction) ([place])    Followed by cardiology on lasix and carvedilol.  Was previously on entresto, now off this. May need to restart ARB.       Chronic respiratory failure with hypoxia, on home O2  therapy ([place])   Obesity hypoventilation syndrome ([place])   Medicare annual wellness visit, subsequent - Primary    I have personally reviewed the Medicare Annual Wellness questionnaire and have noted 1. The patient's medical and social history 2. Their use of alcohol, tobacco or illicit drugs 3. Their current medications and supplements 4. The patient's functional ability including ADL's, fall risks, home safety risks and hearing or visual impairment. Cognitive function has been assessed and addressed as indicated.  5. Diet and physical activity 6. Evidence for depression  or mood disorders The patients weight, height, BMI have been recorded in the chart. I have made referrals, counseling and provided education to the patient based on review of the above and I have provided the pt with a written personalized care plan for preventive services. Provider list updated.. See scanned questionairre as needed for further documentation. Reviewed preventative protocols and updated unless pt declined.       RESOLVED: Urinary urgency       Meds ordered this encounter  Medications  . Cholecalciferol (VITAMIN D) 50 MCG (2000 UT) CAPS    Sig: Take 1 capsule (2,000 Units total) by mouth daily.    Dispense:  30 capsule  . ferrous sulfate 325 (65 FE) MG EC tablet    Sig: Take 1 tablet (325 mg total) by mouth every Monday, Wednesday, and Friday.   Orders Placed This Encounter  Procedures  . Fecal occult blood, imunochemical    Standing Status:   Future    Standing Expiration Date:   02/08/2022    Patient instructions: Call to schedule mammogram at your convenience: [facility] at [facility] [phone] Start vitamin D 1000-2000 units daily. If interested, check with pharmacy about new 2 shot shingles series (shingrix).  Iron levels were low - start oral iron (ferrous sulfate) 329m (65FE) Mon, Wed, Friday.  Pass by lab to pick up stool kit.  Look into myrbetriq for bladder.  Return in 3-4 months for close follow up of anemia and kidneys/diabetes.   Follow up plan: Return in about 4 months (around 06/11/2021), or if symptoms worsen or fail to improve, for follow up visit.  JRia Bush MD

## 2021-02-08 NOTE — Patient Instructions (Addendum)
Call to schedule mammogram at your convenience: [facility] at [facility] [phone] Start vitamin D 1000-2000 units daily. If interested, check with pharmacy about new 2 shot shingles series (shingrix).  Iron levels were low - start oral iron (ferrous sulfate) $RemoveBeforeDEI'325mg'ULsbnckjqUCfIQGZ$  (65FE) Mon, Wed, Friday.  Pass by lab to pick up stool kit.  Look into myrbetriq for bladder.  Return in 3-4 months for close follow up of anemia and kidneys/diabetes.   Health Maintenance After Age 37 After age 92, you are at a higher risk for certain long-term diseases and infections as well as injuries from falls. Falls are a major cause of broken bones and head injuries in people who are older than age 19. Getting regular preventive care can help to keep you healthy and well. Preventive care includes getting regular testing and making lifestyle changes as recommended by your health care provider. Talk with your health care provider about:  Which screenings and tests you should have. A screening is a test that checks for a disease when you have no symptoms.  A diet and exercise plan that is right for you. What should I know about screenings and tests to prevent falls? Screening and testing are the best ways to find a health problem early. Early diagnosis and treatment give you the best chance of managing medical conditions that are common after age 58. Certain conditions and lifestyle choices may make you more likely to have a fall. Your health care provider may recommend:  Regular vision checks. Poor vision and conditions such as cataracts can make you more likely to have a fall. If you wear glasses, make sure to get your prescription updated if your vision changes.  Medicine review. Work with your health care provider to regularly review all of the medicines you are taking, including over-the-counter medicines. Ask your health care provider about any side effects that may make you more likely to have a fall. Tell your  health care provider if any medicines that you take make you feel dizzy or sleepy.  Osteoporosis screening. Osteoporosis is a condition that causes the bones to get weaker. This can make the bones weak and cause them to break more easily.  Blood pressure screening. Blood pressure changes and medicines to control blood pressure can make you feel dizzy.  Strength and balance checks. Your health care provider may recommend certain tests to check your strength and balance while standing, walking, or changing positions.  Foot health exam. Foot pain and numbness, as well as not wearing proper footwear, can make you more likely to have a fall.  Depression screening. You may be more likely to have a fall if you have a fear of falling, feel emotionally low, or feel unable to do activities that you used to do.  Alcohol use screening. Using too much alcohol can affect your balance and may make you more likely to have a fall. What actions can I take to lower my risk of falls? General instructions  Talk with your health care provider about your risks for falling. Tell your health care provider if: ? You fall. Be sure to tell your health care provider about all falls, even ones that seem minor. ? You feel dizzy, sleepy, or off-balance.  Take over-the-counter and prescription medicines only as told by your health care provider. These include any supplements.  Eat a healthy diet and maintain a healthy weight. A healthy diet includes low-fat dairy products, low-fat (lean) meats, and fiber from whole grains, beans,  and lots of fruits and vegetables. Home safety  Remove any tripping hazards, such as rugs, cords, and clutter.  Install safety equipment such as grab bars in bathrooms and safety rails on stairs.  Keep rooms and walkways well-lit. Activity  Follow a regular exercise program to stay fit. This will help you maintain your balance. Ask your health care provider what types of exercise are  appropriate for you.  If you need a cane or walker, use it as recommended by your health care provider.  Wear supportive shoes that have nonskid soles.   Lifestyle  Do not drink alcohol if your health care provider tells you not to drink.  If you drink alcohol, limit how much you have: ? 0-1 drink a day for women. ? 0-2 drinks a day for men.  Be aware of how much alcohol is in your drink. In the [place] one drink equals one typical bottle of beer (12 oz), one-half glass of wine (5 oz), or one shot of hard liquor (1 oz).  Do not use any products that contain nicotine or tobacco, such as cigarettes and e-cigarettes. If you need help quitting, ask your health care provider. Summary  Having a healthy lifestyle and getting preventive care can help to protect your health and wellness after age 43.  Screening and testing are the best way to find a health problem early and help you avoid having a fall. Early diagnosis and treatment give you the best chance for managing medical conditions that are more common for people who are older than age 79.  Falls are a major cause of broken bones and head injuries in people who are older than age 68. Take precautions to prevent a fall at home.  Work with your health care provider to learn what changes you can make to improve your health and wellness and to prevent falls. This information is not intended to replace advice given to you by your health care provider. Make sure you discuss any questions you have with your health care provider. Document Revised: 01/06/2019 Document Reviewed: 07/29/2017 Elsevier Patient Education  2021 [employer].

## 2021-02-09 ENCOUNTER — Encounter: Payer: Self-pay | Admitting: Family Medicine

## 2021-02-09 DIAGNOSIS — Z0000 Encounter for general adult medical examination without abnormal findings: Secondary | ICD-10-CM | POA: Insufficient documentation

## 2021-02-09 NOTE — Assessment & Plan Note (Signed)
Continue statin, aspirin 

## 2021-02-09 NOTE — Assessment & Plan Note (Signed)
Continue pantoprazole 40 mg daily.  ?

## 2021-02-09 NOTE — Assessment & Plan Note (Signed)
Chronic, stable on carvedilol and lasix.  Consider restarting ARB - see below.

## 2021-02-09 NOTE — Assessment & Plan Note (Signed)
Followed by cardiology on lasix and carvedilol.  Was previously on entresto, now off this. May need to restart ARB.

## 2021-02-09 NOTE — Assessment & Plan Note (Signed)
Progressive mild anemia noted - recent anemia panel showing low iron levels, normal B12 and folate - will start oral iron MWF, will send iFOB. ?anemia of chronic disease.

## 2021-02-09 NOTE — Assessment & Plan Note (Signed)
Chronic, ongoing. Bad reaction to [place] or Detrol, prefers not to use oxybutynin she has at home. Could consider trial myrbetriq.

## 2021-02-09 NOTE — Assessment & Plan Note (Signed)
Quit smoking 2010.  Continues lung cancer screening program.

## 2021-02-09 NOTE — Assessment & Plan Note (Signed)
Continue prozac and mood stabilizers through psychiatrist

## 2021-02-09 NOTE — Assessment & Plan Note (Signed)
Advanced directive: scanned 05/2017. Husband Melvyn Neth is HCPOA. Does not want CPR, DNR goldenrod form provided last year for home. Does not want prolonged life support

## 2021-02-09 NOTE — Assessment & Plan Note (Signed)
UTD dexa. Restart vit D replacement.

## 2021-02-09 NOTE — Assessment & Plan Note (Signed)
Continue symbicort, singulair, albuterol PRN

## 2021-02-09 NOTE — Assessment & Plan Note (Signed)

## 2021-02-09 NOTE — Assessment & Plan Note (Signed)
Reviewed BID symbicort use

## 2021-02-09 NOTE — Assessment & Plan Note (Addendum)
Levels low - restart 1000-2000 IU daily.

## 2021-02-09 NOTE — Assessment & Plan Note (Signed)
Deteriorated control - attributes to poor diet recently - may recent celebrations including daughter's wedding. Will renew efforts towards better glycemic control.

## 2021-02-09 NOTE — Assessment & Plan Note (Signed)
With [facility] - continue BiPAP

## 2021-02-09 NOTE — Assessment & Plan Note (Signed)
Thyroid stable on levothyroxine daily

## 2021-02-09 NOTE — Assessment & Plan Note (Signed)
Chronic, continue simvastatin The ASCVD Risk score Amy George DC Jr., et al., 2013) failed to calculate for the following reasons:   The patient has a prior MI or stroke diagnosis

## 2021-02-18 ENCOUNTER — Ambulatory Visit (INDEPENDENT_AMBULATORY_CARE_PROVIDER_SITE_OTHER): Payer: Medicare Other

## 2021-02-18 ENCOUNTER — Encounter: Payer: Self-pay | Admitting: Family Medicine

## 2021-02-18 DIAGNOSIS — I5020 Unspecified systolic (congestive) heart failure: Secondary | ICD-10-CM

## 2021-02-18 DIAGNOSIS — E1142 Type 2 diabetes mellitus with diabetic polyneuropathy: Secondary | ICD-10-CM | POA: Diagnosis not present

## 2021-02-18 NOTE — Telephone Encounter (Signed)
Gabapentin Last rx:  10/08/20, #60 Last OV: 02/08/21, AWV Next OV: 06/14/21, 4 mo DM f/u

## 2021-02-18 NOTE — Chronic Care Management (AMB) (Signed)
Chronic Care Management   CCM RN Visit Note  02/18/2021 Name: Amy Barnes MRN: [id number] DOB: 1951-03-31  Subjective: Amy Barnes is a 70 y.o. year old female who is a primary care patient of [name], MD. The care management team was consulted for assistance with disease management and care coordination needs.    Engaged with patient by telephone for follow up visit in response to provider referral for case management and/or care coordination services.   Consent to Services:  The patient was given information about Chronic Care Management services, agreed to services, and gave verbal consent prior to initiation of services.  Please see initial visit note for detailed documentation.   Patient agreed to services and verbal consent obtained.   Assessment: Review of patient past medical history, allergies, medications, health status, including review of consultants reports, laboratory and other test data, was performed as part of comprehensive evaluation and provision of chronic care management services.   SDOH (Social Determinants of Health) assessments and interventions performed:    CCM Care Plan  Allergies  Allergen Reactions  . Gala Murdoch [Fesoterodine] Nausea And Vomiting    Severe reaction - s/p several ER visits then hospitalization ?stress induced cardiomyopathy  . Cephalexin Hives  . Hydrocodone Other (See Comments)    Reaction:  Hallucinations   . Entresto [Sacubitril-Valsartan]   . Macrodantin [Nitrofurantoin]     Dyspnea, lips peeling  . Tolterodine Tartrate Nausea And Vomiting  . Risperidone And Related Other (See Comments)    Reaction:  Made pt excessively sleepy  . Seroquel [Quetiapine Fumarate] Other (See Comments)    Reaction:  Made pt excessively sleepy  . Sulfa Antibiotics Rash    Tolerated bactrim course well 06/2020    Outpatient Encounter Medications as of 02/18/2021  Medication Sig Note  . albuterol (PROVENTIL HFA;VENTOLIN HFA) 108 (90 Base)  MCG/ACT inhaler Inhale 2 puffs into the lungs every 6 (six) hours as needed for wheezing or shortness of breath.   Marland Kitchen aspirin 81 MG EC tablet Take 81 mg by mouth at bedtime.   . budesonide-formoterol (SYMBICORT) 160-4.5 MCG/ACT inhaler Inhale 2 puffs into the lungs 2 (two) times daily. (Patient taking differently: Inhale 2 puffs into the lungs 2 (two) times daily. Pt uses as needed.) 02/18/2021: 2 puffs 1 time per day  . carvedilol (COREG) 6.25 MG tablet Take 1 tablet (6.25 mg total) by mouth 2 (two) times daily.   . Cholecalciferol (VITAMIN D) 50 MCG (2000 UT) CAPS Take 1 capsule (2,000 Units total) by mouth daily.   Marland Kitchen dicyclomine (BENTYL) 20 MG tablet Take 1 tablet (20 mg total) by mouth in the morning and at bedtime. (Patient taking differently: Take 20 mg by mouth in the morning and at bedtime. As needed)   . doxycycline (VIBRA-TABS) 100 MG tablet TAKE ONE TABLET BY MOUTH TWICE A DAY   . fluconazole (DIFLUCAN) 200 MG tablet Take 200 mg by mouth daily. Daughter states patient is taking 1 in am 02/18/2021: As needed   . FLUoxetine (PROZAC) 40 MG capsule Take 40 mg by mouth daily.    . furosemide (LASIX) 20 MG tablet Take 2 tablets (40 mg total) by mouth daily. And as directed   . gabapentin (NEURONTIN) 100 MG capsule TAKE ONE TO TWO CAPSULES BY MOUTH AT BEDTIME   . lamoTRIgine (LAMICTAL) 200 MG tablet Take 400 mg by mouth daily.   Marland Kitchen levothyroxine (SYNTHROID) 150 MCG tablet TAKE ONE TABLET BY MOUTH AT BEDTIME (Patient taking differently: Take 150 mcg  by mouth daily before breakfast. Pt takes at bedtime.) 12/11/2020: Daughter states patient takes 1 at bedtime  . metFORMIN (GLUCOPHAGE) 500 MG tablet Take 1 tablet (500 mg total) by mouth daily with breakfast. (Patient taking differently: Take 500 mg by mouth daily with breakfast. Pt takes at bedtime)   . montelukast (SINGULAIR) 10 MG tablet TAKE ONE TABLET BY MOUTH AT BEDTIME   . Multiple Vitamin (MULTIVITAMIN WITH MINERALS) TABS tablet Take 2 tablets by  mouth daily.    . ondansetron (ZOFRAN ODT) 8 MG disintegrating tablet Take 1 tablet (8 mg total) by mouth 2 (two) times daily as needed for nausea or vomiting.   . pantoprazole (PROTONIX) 40 MG tablet TAKE ONE TABLET BY MOUTH TWICE A DAY   . polyethylene glycol (MIRALAX / GLYCOLAX) 17 g packet Take 17 g by mouth 2 (two) times daily as needed for mild constipation.   . potassium chloride (KLOR-CON) 10 MEQ tablet Take 1 tablet (10 mEq total) by mouth daily. With lasix. May take an additional 10 meq when taking 60 meq of lasix   . prochlorperazine (COMPAZINE) 10 MG tablet Take 1 tablet (10 mg total) by mouth every 6 (six) hours as needed for nausea or vomiting.   . promethazine (PHENERGAN) 25 MG suppository Place 1 suppository (25 mg total) rectally every 6 (six) hours as needed for nausea or vomiting.   . simvastatin (ZOCOR) 40 MG tablet TAKE ONE TABLET BY MOUTH EVERY EVENING   . sucralfate (CARAFATE) 1 GM/10ML suspension Take 10 mLs (1 g total) by mouth 4 (four) times daily -  with meals and at bedtime. (Patient taking differently: Take 1 g by mouth 4 (four) times daily -  with meals and at bedtime. Pt takes as needed.)   . ferrous sulfate 325 (65 FE) MG EC tablet Take 1 tablet (325 mg total) by mouth every Monday, Wednesday, and Friday. 02/18/2021: Daughter states patient has not started yet. Daughter will pick up medicine for patient.   . Oxcarbazepine (TRILEPTAL) 300 MG tablet Take 300 mg by mouth 2 (two) times daily. Pt takes 1 tablet at bedtime (Patient not taking: Reported on 02/18/2021)    No facility-administered encounter medications on file as of 02/18/2021.    Patient Active Problem List   Diagnosis Date Noted  . Medicare annual wellness visit, subsequent 02/09/2021  . DNR (do not resuscitate) 05/31/2020  . Chronic respiratory failure with hypoxia, on home O2 therapy (HCC) 05/04/2020  . Obesity hypoventilation syndrome (HCC) 05/04/2020  . LVH (left ventricular hypertrophy) 05/04/2020  .  Pneumonia due to COVID-19 virus 05/03/2020  . Non-ischemic cardiomyopathy (HCC) 12/29/2019  . HFrEF (heart failure with reduced ejection fraction) (HCC) 11/25/2019  . UTI (urinary tract infection) 11/25/2019  . NSTEMI (non-ST elevated myocardial infarction) (HCC) 11/09/2019  . QT prolongation 11/09/2019  . Hyponatremia 11/03/2019  . Diffuse abdominal pain 11/03/2019  . Nausea and vomiting 11/03/2019  . Diminished pulses in lower extremity 08/11/2019  . Osteopenia 08/11/2019  . Wound infection complicating hardware (HCC) 04/01/2019  . 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 Neurogenic urinary incontinence 11/29/2018  . Urinary incontinence 11/29/2018  . Bladder spasms 11/29/2018  . Discogenic low back pain 11/29/2018  . Chronic sacroiliac joint pain (Bilateral) (R>L) 11/29/2018  . Spondylosis without myelopathy or radiculopathy, lumbar region 11/09/2018  . Chronic low back pain (Bilateral) (L>R) w/o sciatica 11/09/2018  . History of hip replacement (Left) 10/13/2018  . History of back surgery 10/13/2018  . Vitamin D insufficiency 10/13/2018  .  Osteoarthritis of facet joint of lumbar spine 10/13/2018  . Osteoarthritis involving multiple joints 10/13/2018  . Cervical radiculopathy (Left) 10/13/2018  . Lumbar facet arthropathy (Bilateral) 10/13/2018  . Lumbar facet syndrome (Bilateral) (L>R) 10/13/2018  . Abnormal MRI, lumbar spine (09/16/2018) 10/13/2018  . Lumbar nerve root compression (L4) (Left) 10/13/2018  . Lumbar foraminal stenosis 10/13/2018  . 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 Chronic low back pain (Primary Area of Pain) (Bilateral) (L>R) w/ sciatica (Left) 09/14/2018  . Chronic lower extremity pain (Secondary Area of Pain) (Left) 09/14/2018  . Chronic hip pain [facility] Area of Pain) (Bilateral) (L>R) 09/14/2018  . Chronic knee pain (Fourth Area of Pain) (Bilateral) (R>L)  09/14/2018  . Polypharmacy 09/14/2018  . Disorder of skeletal system 09/14/2018  . Problems influencing health status 09/14/2018  . Primary osteoarthritis of first carpometacarpal joint of left hand 04/20/2018  . PAH (pulmonary artery hypertension) (HCC) 08/03/2017  . Advanced care planning/counseling discussion 06/25/2017  . Synovial cyst of lumbar facet joint 11/13/2016  . CAD (coronary artery disease) 07/30/2016  . Centrilobular emphysema (HCC) 07/30/2016  . Ex-smoker 07/08/2016  . Nonspecific abnormal electrocardiogram (ECG) (EKG) 07/08/2016  . Chronic lumbar radicular pain (L4) (Bilateral) 06/09/2016  . DOE (dyspnea on exertion) 05/22/2016  . Degenerative joint disease (DJD) of hip 04/17/2016  . Somatic dysfunction of sacroiliac joint 04/17/2016  . S/P Lumbar Fusion (L3-4 PLIF) 04/17/2016  . Cervical fusion syndrome 04/17/2016  . Pedal edema 02/19/2016  . Chronic pain syndrome 02/19/2016  . Aortic atherosclerosis (HCC) 12/11/2015  . MRSA (methicillin resistant Staphylococcus aureus) infection 10/11/2012  . Infection or inflammatory reaction due to internal joint prosthesis (HCC) 12/03/2011  . Morbid obesity with BMI of 60.0-69.9, adult (HCC) 05/13/2011  . Essential hypertension 05/17/2010  . Cervical radiculopathy (Right) 12/25/2009  . Benign positional vertigo 07/21/2008  . Hyperlipidemia associated with type 2 diabetes mellitus (HCC) 08/16/2007  . Hypothyroidism 07/30/2007  . DM type 2 with diabetic peripheral neuropathy (HCC) 07/30/2007  . Normocytic anemia 07/30/2007  . Bipolar disorder (HCC) 07/30/2007  . Allergic rhinitis 07/30/2007  . Asthma 07/30/2007  . GERD 07/30/2007  . Osteoarthritis 07/30/2007  . OSA treated with BiPAP 07/30/2007    Conditions to be addressed/monitored:CHF and DMII  Care Plan : Heart Failure (Adult)  Updates made by Otho Ket, RN since 02/18/2021 12:00 AM  Problem: Disease Progression (Heart Failure)   Priority: Medium  Onset Date:  11/12/2020  Long-Range Goal: patient will verbalize heart failure home managment strategies   Start Date: 11/12/2020  Expected End Date: 05/29/2021  This Visit's Progress: On track  Recent Progress: On track  Priority: Medium  Current Barriers:  Patient denies any new or increase symptoms related to her heart failure. She reports she continue to take her fluid pill as prescribed and knows to take her potassium pill if fluid pill has to be increase.  She states she continues to weigh daily.  Patient states her daughter continues to help her manage her medications. Patient gives authorization to speak with her daughter [name] regarding her health information. Medications reviewed with patients daughter.  . Knowledge deficit related to basic heart failure pathophysiology and self care management:   . Cognitive Deficits: Patient reports having difficulty with focus/ concentration: Daughter continues to assist patient with her medical needs.   . Case Manager Clinical Goal(s):  . patient will verbalize understanding of Heart Failure Action Plan and when to call doctor:  Patient reports she is in the [place] zone today.  . patient will take all Heart Failure medications as prescribed . patient will weigh daily and record (notifying MD of 3 lb weight gain over night or 5 lb in a week) . Patient will wear oxygen as advised by provider and check oxygen saturations daily.   Patient reports she is using her oxygen around the clock at 2 -3 liters depending on exacerbation of asthma symptoms. She reports she is not using her Bipap equipment consistently because the head gear will not stay on.  Daughter states she is working on getting patient new Administrator, arts with Bipap provider.   Interventions:  . Collaboration with [name], MD regarding development and update of comprehensive plan of care as evidenced by provider attestation and co-signature . Inter-disciplinary care team collaboration (see longitudinal plan  of care) . Provided verbal education on low sodium diet:  Reinforced ongoing need to follow low salt diet.  . Reviewed Heart Failure Action Plan.   . Reviewed role of diuretics in prevention of fluid overload and management of heart failure . Patient Goals/Self-Care Activities . call office if you gain more than 3 pounds in one day or 5 pounds in one week (Follow doctor advisement for weight gain of 3 lbs overnight and 5 lbs in a week) . Continue to do ankle pumps when sitting . Continue to keep legs up while sitting . Continue to follow a low salt diet and adhere to fluid restriction recommendations.   . Continue to watch for swelling in feet, ankles and legs every day (report to doctor for increase in swelling, weight gain, and/ or shortness of breath). . Continue to weigh daily and record weights.   . Wear oxygen as advised by provider and check oxygen saturations daily . Continue to take your medications as prescribed. Refill medication timely.  Follow Up Plan: The patient has been provided with contact information for the care management team and has been advised to call with any health related questions or concerns.  The care management team will reach out to the patient again over the next 45 days.      Care Plan : Diabetes Type 2 (Adult)  Updates made by Otho Ket, RN since 02/18/2021 12:00 AM  Problem: Glycemic Management (Diabetes, Type 2)   Priority: Medium  Long-Range Goal: Patient will verbalize home management strategies for diabetes   Start Date: 11/12/2020  Expected End Date: 05/29/2021  This Visit's Progress: On track  Recent Progress: On track  Priority: Medium  Objective:  Lab Results  Component Value Date   HGBA1C 7.9 (H) 02/01/2021 .   Lab Results  Component Value Date   CREATININE 0.62 02/01/2021   CREATININE 0.70 11/23/2020   CREATININE 0.83 06/15/2020   Current Barriers:  Marland Kitchen Knowledge Deficits related to basic Diabetes self care/management:  Patient  states she is checking her blood sugars 1 time per week. She reports her fasting blood sugar this week was 157.  She states she continues to take oral medication for her diabetes. She states she continues to adheres to a low carb/ diabetic diet at least 50% of the time.  Patient states she is trying to get back on track with her home medical management.   Case Manager Clinical Goal(s):  . patient will demonstrate improved adherence to prescribed treatment plan for diabetes self care/management. Interventions:  . Collaboration with [name], MD regarding development and update of comprehensive plan of care as evidenced  by provider attestation and co-signature . Inter-disciplinary care team collaboration (see longitudinal plan of care) . Reviewed medications with and discussed importance of medication adherence.   . Discussed plans with patient for ongoing care management follow up and provided patient with direct contact information for care management team . Patient will monitor and record blood sugars at least 2-3 times per week . Patient will continue to adhere to a ADA/carb modified diet. Discussed importance of adhering to low carb/ diabetic diet.   . Education article sent to patient regarding blood sugar monitoring.  Patient Goals/Self-Care Activities - Attempt to check blood sugar at least 2-3 times per week  - Check blood sugar if you feel it is too high or too low - Take the blood sugar meter to your doctor visits  - Continue to take medication as prescribed.  - Attend scheduled provider appointments - Continue to follow an ADA/carb modified (review article sent to you in your My Chart) Follow Up Plan: The patient has been provided with contact information for the care management team and has been advised to call with any health related questions or concerns.  The care management team will reach out to the patient again over the next 45 days.        Plan:The patient has  been provided with contact information for the care management team and has been advised to call with any health related questions or concerns.  and The care management team will reach out to the patient again over the next 45 days. George Ina RN,BSN,CCM RN Case Manager [facility] [place]  [phone]

## 2021-02-18 NOTE — Patient Instructions (Addendum)
Visit Information: Thank you for taking the time to speak with me today.  PATIENT GOALS: Goals Addressed            This Visit's Progress   . Monitor and Manage My Blood Sugar-Diabetes Type 2   On track    Timeframe:  Long-Range Goal Priority:  Medium Start Date:  11/12/2020                           Expected End Date: 6/31/22  Follow Up Date  03/26/2021    - Attempt to check blood sugar at least 2-3 times per week  - Check blood sugar if you feel it is too high or too low - Take the blood sugar meter to your doctor visits  - Continue to take medication as prescribed.  - Attend scheduled provider appointments - Continue to follow an ADA/carb modified (review article sent to you in your My Chart)  Why is this important?    Checking your blood sugar at home helps to keep it from getting very high or very low.   Writing the results in a diary or log helps the doctor know how to care for you.   Your blood sugar log should have the time, date and the results.   Also, write down the amount of insulin or other medicine that you take.   Other information, like what you ate, exercise done and how you were feeling, will also be helpful.         . Track and Manage Fluids and Swelling-Heart Failure   On track    Timeframe:  Long-Range Goal Priority:  Medium Start Date:  11/12/2020                           Expected End Date: 05/29/2021                   Follow Up Date 03/26/2021   . call office if you gain more than 3 pounds in one day or 5 pounds in one week (Follow doctor advisement for weight gain of 3 lbs overnight and 5 lbs in a week) . Continue to do ankle pumps when sitting . Continue to keep legs up while sitting . Continue to follow a low salt diet and adhere to fluid restriction recommendations.   . Continue to watch for swelling in feet, ankles and legs every day (report to doctor for increase in swelling, weight gain, and/ or shortness of breath). . Continue to weigh  daily and record weights.   . Wear oxygen as advised by provider and check oxygen saturations daily . Continue to take your medications as prescribed. Refill medication timely.   Why is this important?    It is important to check your weight daily and watch how much salt and liquids you have.   It will help you to manage your heart failure.           Diabetes Mellitus and Nutrition, Adult When you have diabetes, or diabetes mellitus, it is very important to have healthy eating habits because your blood sugar (glucose) levels are greatly affected by what you eat and drink. Eating healthy foods in the right amounts, at about the same times every day, can help you:  Control your blood glucose.  Lower your risk of heart disease.  Improve your blood pressure.  Reach or maintain a healthy weight. What  can affect my meal plan? Every person with diabetes is different, and each person has different needs for a meal plan. Your health care provider may recommend that you work with a dietitian to make a meal plan that is best for you. Your meal plan may vary depending on factors such as:  The calories you need.  The medicines you take.  Your weight.  Your blood glucose, blood pressure, and cholesterol levels.  Your activity level.  Other health conditions you have, such as heart or kidney disease. How do carbohydrates affect me? Carbohydrates, also called carbs, affect your blood glucose level more than any other type of food. Eating carbs naturally raises the amount of glucose in your blood. Carb counting is a method for keeping track of how many carbs you eat. Counting carbs is important to keep your blood glucose at a healthy level, especially if you use insulin or take certain oral diabetes medicines. It is important to know how many carbs you can safely have in each meal. This is different for every person. Your dietitian can help you calculate how many carbs you should have at each  meal and for each snack. How does alcohol affect me? Alcohol can cause a sudden decrease in blood glucose (hypoglycemia), especially if you use insulin or take certain oral diabetes medicines. Hypoglycemia can be a life-threatening condition. Symptoms of hypoglycemia, such as sleepiness, dizziness, and confusion, are similar to symptoms of having too much alcohol.  Do not drink alcohol if: ? Your health care provider tells you not to drink. ? You are pregnant, may be pregnant, or are planning to become pregnant.  If you drink alcohol: ? Do not drink on an empty stomach. ? Limit how much you use to:  0-1 drink a day for women.  0-2 drinks a day for men. ? Be aware of how much alcohol is in your drink. In the [place] one drink equals one 12 oz bottle of beer (355 mL), one 5 oz glass of wine (148 mL), or one 1 oz glass of hard liquor (44 mL). ? Keep yourself hydrated with water, diet soda, or unsweetened iced tea.  Keep in mind that regular soda, juice, and other mixers may contain a lot of sugar and must be counted as carbs. What are tips for following this plan? Reading food labels  Start by checking the serving size on the "Nutrition Facts" label of packaged foods and drinks. The amount of calories, carbs, fats, and other nutrients listed on the label is based on one serving of the item. Many items contain more than one serving per package.  Check the total grams (g) of carbs in one serving. You can calculate the number of servings of carbs in one serving by dividing the total carbs by 15. For example, if a food has 30 g of total carbs per serving, it would be equal to 2 servings of carbs.  Check the number of grams (g) of saturated fats and trans fats in one serving. Choose foods that have a low amount or none of these fats.  Check the number of milligrams (mg) of salt (sodium) in one serving. Most people should limit total sodium intake to less than 2,300 mg per day.  Always check the  nutrition information of foods labeled as "low-fat" or "nonfat." These foods may be higher in added sugar or refined carbs and should be avoided.  Talk to your dietitian to identify your daily goals for nutrients listed on  the label. Shopping  Avoid buying canned, pre-made, or processed foods. These foods tend to be high in fat, sodium, and added sugar.  Shop around the outside edge of the grocery store. This is where you will most often find fresh fruits and vegetables, bulk grains, fresh meats, and fresh dairy. Cooking  Use low-heat cooking methods, such as baking, instead of high-heat cooking methods like deep frying.  Cook using healthy oils, such as olive, canola, or sunflower oil.  Avoid cooking with butter, cream, or high-fat meats. Meal planning  Eat meals and snacks regularly, preferably at the same times every day. Avoid going long periods of time without eating.  Eat foods that are high in fiber, such as fresh fruits, vegetables, beans, and whole grains. Talk with your dietitian about how many servings of carbs you can eat at each meal.  Eat 4-6 oz (112-168 g) of lean protein each day, such as lean meat, chicken, fish, eggs, or tofu. One ounce (oz) of lean protein is equal to: ? 1 oz (28 g) of meat, chicken, or fish. ? 1 egg. ?  cup (62 g) of tofu.  Eat some foods each day that contain healthy fats, such as avocado, nuts, seeds, and fish.   What foods should I eat? Fruits Berries. Apples. Oranges. Peaches. Apricots. Plums. Grapes. Mango. Papaya. Pomegranate. Kiwi. Cherries. Vegetables Lettuce. Spinach. Leafy greens, including kale, chard, collard greens, and mustard greens. Beets. Cauliflower. Cabbage. Broccoli. Carrots. Torianna Junio beans. Tomatoes. Peppers. Onions. Cucumbers. Brussels sprouts. Grains Whole grains, such as whole-wheat or whole-grain bread, crackers, tortillas, cereal, and pasta. Unsweetened oatmeal. Quinoa. Brown or wild rice. Meats and other  proteins Seafood. Poultry without skin. Lean cuts of poultry and beef. Tofu. Nuts. Seeds. Dairy Low-fat or fat-free dairy products such as milk, yogurt, and cheese. The items listed above may not be a complete list of foods and beverages you can eat. Contact a dietitian for more information. What foods should I avoid? Fruits Fruits canned with syrup. Vegetables Canned vegetables. Frozen vegetables with butter or cream sauce. Grains Refined Gendron flour and flour products such as bread, pasta, snack foods, and cereals. Avoid all processed foods. Meats and other proteins Fatty cuts of meat. Poultry with skin. Breaded or fried meats. Processed meat. Avoid saturated fats. Dairy Full-fat yogurt, cheese, or milk. Beverages Sweetened drinks, such as soda or iced tea. The items listed above may not be a complete list of foods and beverages you should avoid. Contact a dietitian for more information. Questions to ask a health care provider  Do I need to meet with a diabetes educator?  Do I need to meet with a dietitian?  What number can I call if I have questions?  When are the best times to check my blood glucose? Where to find more information:  American Diabetes Association: diabetes.org  Academy of Nutrition and Dietetics: www.eatright.[employer] of Diabetes and Digestive and Kidney Diseases: CarFlippers.tn  Association of Diabetes Care and Education Specialists: www.diabeteseducator.org Summary  It is important to have healthy eating habits because your blood sugar (glucose) levels are greatly affected by what you eat and drink.  A healthy meal plan will help you control your blood glucose and maintain a healthy lifestyle.  Your health care provider may recommend that you work with a dietitian to make a meal plan that is best for you.  Keep in mind that carbohydrates (carbs) and alcohol have immediate effects on your blood glucose levels. It is important to  count carbs and to use alcohol carefully. This information is not intended to replace advice given to you by your health care provider. Make sure you discuss any questions you have with your health care provider. Document Revised: 08/23/2019 Document Reviewed: 08/23/2019 Elsevier Patient Education  2021 Elsevier Inc.   Patient verbalizes understanding of instructions provided today and agrees to view in [place].   The patient has been provided with contact information for the care management team and has been advised to call with any health related questions or concerns.  The care management team will reach out to the patient again over the next 45 days.   George Ina RN,BSN,CCM RN Case Manager [facility] [place]  [phone]   `

## 2021-02-19 DIAGNOSIS — F3181 Bipolar II disorder: Secondary | ICD-10-CM | POA: Diagnosis not present

## 2021-02-20 MED ORDER — GABAPENTIN 100 MG PO CAPS: ORAL_CAPSULE | ORAL | 6 refills | Status: DC

## 2021-02-20 NOTE — Telephone Encounter (Signed)
ERx 

## 2021-03-15 ENCOUNTER — Other Ambulatory Visit: Payer: Self-pay

## 2021-03-15 ENCOUNTER — Encounter (HOSPITAL_BASED_OUTPATIENT_CLINIC_OR_DEPARTMENT_OTHER): Payer: Self-pay | Admitting: *Deleted

## 2021-03-15 ENCOUNTER — Emergency Department (HOSPITAL_BASED_OUTPATIENT_CLINIC_OR_DEPARTMENT_OTHER): Payer: Medicare Other

## 2021-03-15 ENCOUNTER — Emergency Department (HOSPITAL_BASED_OUTPATIENT_CLINIC_OR_DEPARTMENT_OTHER)
Admission: EM | Admit: 2021-03-15 | Discharge: 2021-03-15 | Disposition: A | Discharge status: Home / Self Care | Payer: Medicare Other | Attending: Emergency Medicine | Admitting: Emergency Medicine

## 2021-03-15 ENCOUNTER — Other Ambulatory Visit (HOSPITAL_BASED_OUTPATIENT_CLINIC_OR_DEPARTMENT_OTHER): Payer: Self-pay

## 2021-03-15 ENCOUNTER — Telehealth: Payer: Self-pay

## 2021-03-15 DIAGNOSIS — E1142 Type 2 diabetes mellitus with diabetic polyneuropathy: Secondary | ICD-10-CM | POA: Diagnosis not present

## 2021-03-15 DIAGNOSIS — J45909 Unspecified asthma, uncomplicated: Secondary | ICD-10-CM | POA: Insufficient documentation

## 2021-03-15 DIAGNOSIS — Z7982 Long term (current) use of aspirin: Secondary | ICD-10-CM | POA: Insufficient documentation

## 2021-03-15 DIAGNOSIS — Z96642 Presence of left artificial hip joint: Secondary | ICD-10-CM | POA: Insufficient documentation

## 2021-03-15 DIAGNOSIS — Z20822 Contact with and (suspected) exposure to covid-19: Secondary | ICD-10-CM | POA: Diagnosis not present

## 2021-03-15 DIAGNOSIS — J9811 Atelectasis: Secondary | ICD-10-CM | POA: Diagnosis not present

## 2021-03-15 DIAGNOSIS — E039 Hypothyroidism, unspecified: Secondary | ICD-10-CM | POA: Insufficient documentation

## 2021-03-15 DIAGNOSIS — Z79899 Other long term (current) drug therapy: Secondary | ICD-10-CM | POA: Insufficient documentation

## 2021-03-15 DIAGNOSIS — J441 Chronic obstructive pulmonary disease with (acute) exacerbation: Secondary | ICD-10-CM | POA: Insufficient documentation

## 2021-03-15 DIAGNOSIS — E1169 Type 2 diabetes mellitus with other specified complication: Secondary | ICD-10-CM | POA: Diagnosis not present

## 2021-03-15 DIAGNOSIS — I5020 Unspecified systolic (congestive) heart failure: Secondary | ICD-10-CM | POA: Insufficient documentation

## 2021-03-15 DIAGNOSIS — Z7951 Long term (current) use of inhaled steroids: Secondary | ICD-10-CM | POA: Insufficient documentation

## 2021-03-15 DIAGNOSIS — Z28310 Unvaccinated for covid-19: Secondary | ICD-10-CM | POA: Diagnosis not present

## 2021-03-15 DIAGNOSIS — J069 Acute upper respiratory infection, unspecified: Secondary | ICD-10-CM | POA: Diagnosis not present

## 2021-03-15 DIAGNOSIS — Z7984 Long term (current) use of oral hypoglycemic drugs: Secondary | ICD-10-CM | POA: Insufficient documentation

## 2021-03-15 DIAGNOSIS — Z8616 Personal history of COVID-19: Secondary | ICD-10-CM | POA: Diagnosis not present

## 2021-03-15 DIAGNOSIS — I2510 Atherosclerotic heart disease of native coronary artery without angina pectoris: Secondary | ICD-10-CM | POA: Insufficient documentation

## 2021-03-15 DIAGNOSIS — E785 Hyperlipidemia, unspecified: Secondary | ICD-10-CM | POA: Diagnosis not present

## 2021-03-15 DIAGNOSIS — I517 Cardiomegaly: Secondary | ICD-10-CM | POA: Diagnosis not present

## 2021-03-15 DIAGNOSIS — R0602 Shortness of breath: Secondary | ICD-10-CM | POA: Diagnosis not present

## 2021-03-15 DIAGNOSIS — B9789 Other viral agents as the cause of diseases classified elsewhere: Secondary | ICD-10-CM | POA: Diagnosis not present

## 2021-03-15 DIAGNOSIS — I110 Hypertensive heart disease with heart failure: Secondary | ICD-10-CM | POA: Insufficient documentation

## 2021-03-15 LAB — RESP PANEL BY RT-PCR (FLU A&B, COVID) ARPGX2
Influenza A by PCR: NEGATIVE
Influenza B by PCR: NEGATIVE
SARS Coronavirus 2 by RT PCR: NEGATIVE

## 2021-03-15 MED ORDER — IPRATROPIUM-ALBUTEROL 0.5-2.5 (3) MG/3ML IN SOLN
3.0000 mL | Freq: Once | RESPIRATORY_TRACT | Status: AC
  Administered 2021-03-15: 3 mL via RESPIRATORY_TRACT
  Filled 2021-03-15: qty 3

## 2021-03-15 MED ORDER — ALBUTEROL SULFATE (2.5 MG/3ML) 0.083% IN NEBU
2.5000 mg | INHALATION_SOLUTION | Freq: Four times a day (QID) | RESPIRATORY_TRACT | 12 refills | Status: DC | PRN
  Filled 2021-03-15: qty 75, 7d supply, fill #0

## 2021-03-15 MED ORDER — PREDNISONE 50 MG PO TABS
60.0000 mg | ORAL_TABLET | Freq: Once | ORAL | Status: AC
  Administered 2021-03-15: 60 mg via ORAL
  Filled 2021-03-15: qty 1

## 2021-03-15 MED ORDER — ALBUTEROL SULFATE HFA 108 (90 BASE) MCG/ACT IN AERS
4.0000 | INHALATION_SPRAY | Freq: Once | RESPIRATORY_TRACT | Status: AC
  Administered 2021-03-15: 4 via RESPIRATORY_TRACT
  Filled 2021-03-15: qty 6.7

## 2021-03-15 MED ORDER — ALBUTEROL SULFATE (5 MG/ML) 0.5% IN NEBU
2.5000 mg | INHALATION_SOLUTION | RESPIRATORY_TRACT | 6 refills | Status: DC | PRN
  Filled 2021-03-15: qty 20, 7d supply, fill #0

## 2021-03-15 MED ORDER — PREDNISONE 10 MG PO TABS
60.0000 mg | ORAL_TABLET | Freq: Every day | ORAL | 0 refills | Status: AC
  Filled 2021-03-15: qty 24, 4d supply, fill #0

## 2021-03-15 NOTE — ED Notes (Signed)
SpO2 83-87% after getting into bed, patient on 2lpm O2 at home. Placed on 6lpm O2, SpO2 increased to 98% WOB decreased.  O2 now at 4LPM.

## 2021-03-15 NOTE — Discharge Instructions (Addendum)
Your Covid and Flu test were negative.  Your chest xray shows some chronic congestion in your lungs but no obvious sign of bacterial pneumonia.  I suspect you are having a viral respiratory illness, which is making your COPD worse.  I would advise you use nebulizer albuterol treatments every 4-6 hours at home for the next 2 days.   Then you can use it AS NEEDED for wheezing, shortness of breath every 6 hours moving forward.  I also prescribed steroids (prednisone), which you will take another 4 days of.  Your next prednisone dose is due tomorrow on 03/16/21 in the morning with breakfast.  Please contact your doctor's office to arrange a follow up visit this week.

## 2021-03-15 NOTE — ED Triage Notes (Addendum)
C/o nasal congestion x 2 days , increased SOB x 1 day , HX of SOB , family at home with DX sinus infections neg Covid , sent here by PMD for Covid test and chest xray

## 2021-03-15 NOTE — Telephone Encounter (Signed)
Agree with in person evaluation.  Recommend [facility] or ER eval.

## 2021-03-15 NOTE — Telephone Encounter (Addendum)
I spoke with pt's husband and EMS was there; EMS said BP 180/P (not sure what P for diastolic means)   pulse 70 pulse ox 97% at 4 L of O2.lungs sounded clear and equal. Pt did not want to go to ED so pt was not transported by EMS. Pt has taken a fluid pill today and is frequently voiding with incontinence at times. Pt is not willing to go to ED or UC due to waiting and taking the fluid pill. Pt had H/A earlier, prod cough with clear, brown and red phlegm. SOB after coughing episodes with pulse ox 84%. Mr Deerman said pt recovers with pulse in mid 90's after resting. Pt has not CP or dizziness. Advised pt's husband that pt should be evaluated and possible testing such as covid test or CXR. Pt's husband said other family members recently with similar symptoms were tested neg for covid; advised understanding but pt may need to be tested herself. No available appts at [facility] and thinks pt needs in person visit at [facility] at least. Mr Batz said pt will not go and wants note sent to Dr Reece Agar for advisement. Request cb after Dr Reece Agar reviews note.  Sending note to DR. Reece Agar and LIsa CMA.

## 2021-03-15 NOTE — Telephone Encounter (Signed)
Pt 's husband notified and voiced understanding and he spoke with pt and they will go to an UC in [place] today. Mr Ell was not sure which UC yet I think he is going to try to call for an appt with an UC in [place]. Sending note to Dr Reece Agar as [facility].

## 2021-03-15 NOTE — ED Provider Notes (Signed)
[facility] EMERGENCY DEPARTMENT Provider Note   CSN: [id number] Arrival date & time: 03/15/21  1554     History Chief Complaint  Patient presents with   Shortness of Breath    Amy Barnes is a 69 y.o. female w/ hx of COPD (on 2 to 4 L nasal cannula baseline, quit smoking 12 years ago), obesity, HTN, reflux, HLD, presenting to the ED with shortness of breath.  Patient reports that her multiple family members sick with nasal congestion and upper respiratory symptoms over the past 2 weeks.  The patient herself became sick 5 days ago on Monday.  She reports nasal congestion, cough, sore throat.  She denies headache.  She denies nausea, loss of taste, vomiting or diarrhea.  She called her PCPs office who advised to come to the ED for evaluation due to her medical comorbidities.  The patient has been using albuterol pump as needed at home.  She does not regularly use her nebulizer but does have machine at home.  She is not currently on steroids but has had them in the past.  She is diabetic on metformin and not insulin.  She reports she has been using and titrating her home oxygen from 2 to 4 L as needed to help her "breathe better".  She and her family members were all tested positive for COVID approximately 1 year ago.  She has not had any of the COVID vaccines.  She reports that the sick family members both took home rapid antigen test which were negative.  HPI     Past Medical History:  Diagnosis Date   Allergic rhinitis    Ambulates with cane    straight   Anemia    Anxiety    Asthma    seasonal   Bipolar affective disorder (HCC)    takes Synthroid meds for Bipolar   CAD (coronary artery disease) 07/2016   by CT scan   Carpal tunnel syndrome    had surgery but occasional still has some issues per patient   Cataract    Centrilobular emphysema (HCC) 07/2016   by CT scan - pt not aware of this   Constipation due to pain medication    Depression with anxiety     Diabetes mellitus    type 2 - no meds diet controlled   GERD (gastroesophageal reflux disease)    History of blood transfusion    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 - 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 PO   Hyperlipidemia    Hypertension    Hypothyroidism    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 01/16/2018   prevnar 13 - large localized reaction 12/2017   OSA (obstructive sleep apnea)    no longer using cpap, uses a bed that raises and lowers hob   Osteoarthritis    Osteopenia 08/11/2019   DEXA 07/2019 - T -1.1 R femur (osteopenia)   Pneumonia    Pneumonia due to COVID-19 virus 05/03/2020   Restless legs    Septic arthritis (HCC) 10/11/2012   Shingles 06/30/2016   Status post revision of total hip replacement bilateral   prosthetic infection R 2013, L 2015   Thoracic aortic atherosclerosis (HCC) 11/207   by CT    Patient Active Problem List   Diagnosis Date Noted   Medicare annual wellness visit, subsequent 02/09/2021   DNR (do not resuscitate) 05/31/2020   Chronic respiratory failure with hypoxia, on home O2 therapy (HCC) 05/04/2020   Obesity hypoventilation syndrome (HCC) 05/04/2020  LVH (left ventricular hypertrophy) 05/04/2020   Pneumonia due to COVID-19 virus 05/03/2020   Non-ischemic cardiomyopathy (HCC) 12/29/2019   HFrEF (heart failure with reduced ejection fraction) (HCC) 11/25/2019   UTI (urinary tract infection) 11/25/2019   NSTEMI (non-ST elevated myocardial infarction) (HCC) 11/09/2019   QT prolongation 11/09/2019   Hyponatremia 11/03/2019   Diffuse abdominal pain 11/03/2019   Nausea and vomiting 11/03/2019   Diminished pulses in lower extremity 08/11/2019   Osteopenia 08/11/2019   Wound infection complicating hardware (HCC) 04/01/2019   Lumbar adjacent segment disease with spondylolisthesis 03/23/2019   Neurogenic urinary incontinence 11/29/2018   Urinary incontinence 11/29/2018   Bladder spasms 11/29/2018   Discogenic low back pain 11/29/2018   Chronic  sacroiliac joint pain (Bilateral) (R>L) 11/29/2018   Spondylosis without myelopathy or radiculopathy, lumbar region 11/09/2018   Chronic low back pain (Bilateral) (L>R) w/o sciatica 11/09/2018   History of hip replacement (Left) 10/13/2018   History of back surgery 10/13/2018   Vitamin D insufficiency 10/13/2018   Osteoarthritis of facet joint of lumbar spine 10/13/2018   Osteoarthritis involving multiple joints 10/13/2018   Cervical radiculopathy (Left) 10/13/2018   Lumbar facet arthropathy (Bilateral) 10/13/2018   Lumbar facet syndrome (Bilateral) (L>R) 10/13/2018   Abnormal MRI, lumbar spine (09/16/2018) 10/13/2018   Lumbar nerve root compression (L4) (Left) 10/13/2018   Lumbar foraminal stenosis 10/13/2018   Chronic hip pain after total replacement x 3 (Left) 10/13/2018   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   Chronic low back pain (Primary Area of Pain) (Bilateral) (L>R) w/ sciatica (Left) 09/14/2018   Chronic lower extremity pain (Secondary Area of Pain) (Left) 09/14/2018   Chronic hip pain (Tertiary Area of Pain) (Bilateral) (L>R) 09/14/2018   Chronic knee pain (Fourth Area of Pain) (Bilateral) (R>L) 09/14/2018   Polypharmacy 09/14/2018   Disorder of skeletal system 09/14/2018   Problems influencing health status 09/14/2018   Primary osteoarthritis of first carpometacarpal joint of left hand 04/20/2018   PAH (pulmonary artery hypertension) (HCC) 08/03/2017   Advanced care planning/counseling discussion 06/25/2017   Synovial cyst of lumbar facet joint 11/13/2016   CAD (coronary artery disease) 07/30/2016   Centrilobular emphysema (HCC) 07/30/2016   Ex-smoker 07/08/2016   Nonspecific abnormal electrocardiogram (ECG) (EKG) 07/08/2016   Chronic lumbar radicular pain (L4) (Bilateral) 06/09/2016   DOE (dyspnea on exertion) 05/22/2016   Degenerative joint disease (DJD) of hip 04/17/2016   Somatic dysfunction of sacroiliac joint 04/17/2016   S/P Lumbar Fusion (L3-4 PLIF)  04/17/2016   Cervical fusion syndrome 04/17/2016   Pedal edema 02/19/2016   Chronic pain syndrome 02/19/2016   Aortic atherosclerosis (HCC) 12/11/2015   MRSA (methicillin resistant Staphylococcus aureus) infection 10/11/2012   Infection or inflammatory reaction due to internal joint prosthesis (HCC) 12/03/2011   Morbid obesity with BMI of 60.0-69.9, adult (HCC) 05/13/2011   Essential hypertension 05/17/2010   Cervical radiculopathy (Right) 12/25/2009   Benign positional vertigo 07/21/2008   Hyperlipidemia associated with type 2 diabetes mellitus (HCC) 08/16/2007   Hypothyroidism 07/30/2007   DM type 2 with diabetic peripheral neuropathy (HCC) 07/30/2007   Normocytic anemia 07/30/2007   Bipolar disorder (HCC) 07/30/2007   Allergic rhinitis 07/30/2007   Asthma 07/30/2007   GERD 07/30/2007   Osteoarthritis 07/30/2007   OSA treated with BiPAP 07/30/2007    Past Surgical History:  Procedure Laterality Date   BREAST CYST ASPIRATION     CARPAL TUNNEL RELEASE Right 05/15/2011   CARPAL TUNNEL RELEASE Left 12/24/2017   Procedure: LEFT CARPAL TUNNEL RELEASE;  Surgeon: [name],  MD;  Location: [facility];  Service: Orthopedics;  Laterality: Left;   CERVICAL FUSION  2012   C2/3/4   COLONOSCOPY WITH PROPOFOL N/A 12/31/2015   diverticulosis, int hem, o/w normal rpt 10 yrs (Rein)   ESOPHAGOGASTRODUODENOSCOPY (EGD) WITH PROPOFOL N/A 11/12/2019   Procedure: ESOPHAGOGASTRODUODENOSCOPY (EGD) WITH PROPOFOL;  Surgeon: Sherrilyn Rist, MD;  Location: [facility];  Service: Gastroenterology;  Laterality: N/A;   EYE SURGERY Bilateral    cataract surgery with lens implant   FOOT SURGERY  1982   bone spur   I & D EXTREMITY  09/06/2012   Budd Palmer, MD; Right;  I&D of right thigh   I & D EXTREMITY Left 07/2013   wound vac - daily doxycycline indefinitely   KNEE ARTHROSCOPY  09/01/2012   Dannielle Huh, MD;  Right   LEFT HEART CATH AND CORONARY ANGIOGRAPHY N/A 11/10/2019    Procedure: LEFT HEART CATH AND CORONARY ANGIOGRAPHY;  Surgeon: Lennette Bihari, MD;  Location: [facility];  Service: Cardiovascular;  Laterality: N/A;   LUMBAR FUSION  01/08/2012   L3-4   LUMBAR FUSION  02/2019   unexpectedly discovered MRSA infection - pus Lovell Sheehan)    LUMBAR LAMINECTOMY/DECOMPRESSION MICRODISCECTOMY N/A 11/13/2016   LAMINOTOMY/LAMINECTOMY LUMBAR FOUR LUMBAR FIVE  WITH RESECTION OF SYNOVIAL CYST;  Surgeon: Tressie Stalker, MD   NECK SURGERY     Herniated disk C2,3,4   NOSE SURGERY     PARTIAL HYSTERECTOMY  1984   for mennorhagia, ovaries remain   REVISION TOTAL HIP ARTHROPLASTY Left 08/28/2011   TMJ ARTHROPLASTY  1982   TOTAL HIP ARTHROPLASTY Left 2002   TRIGGER FINGER RELEASE Right 05/15/2011   long finger     OB History   No obstetric history on file.     Family History  Problem Relation Age of Onset   Aneurysm Father 2       brain   Alcohol abuse Father    Cancer Father        possibly   CAD Other        several siblings   Cancer Brother        prostate   Diabetes Brother    Diabetes Sister    Anesthesia problems Neg Hx    Hypotension Neg Hx    Malignant hyperthermia Neg Hx    Pseudochol deficiency Neg Hx    Breast cancer Neg Hx     Social History   Tobacco Use   Smoking status: Former    Packs/day: 1.50    Years: 40.00    Pack years: 60.00    Types: Cigarettes    Quit date: 04/29/2009    Years since quitting: 11.8   Smokeless tobacco: Never  Vaping Use   Vaping Use: Never used  Substance Use Topics   Alcohol use: Yes    Alcohol/week: 2.0 standard drinks    Types: 2 Shots of liquor per week    Comment: 2 shots/weekly   Drug use: No    Home Medications Prior to Admission medications   Medication Sig Start Date End Date Taking? Authorizing Provider  albuterol (PROVENTIL) (2.5 MG/3ML) 0.083% nebulizer solution Take 3 mLs (2.5 mg total) by nebulization every 6 (six) hours as needed for wheezing or shortness of breath. 03/15/21   Yes Gurvir Schrom, Kermit Balo, MD  albuterol (PROVENTIL) (5 MG/ML) 0.5% nebulizer solution Take 0.5 mLs (2.5 mg total) by nebulization every 4 (four) hours as needed for wheezing or shortness of breath.  03/15/21  Yes Dolan Xia, Kermit Balo, MD  predniSONE (DELTASONE) 10 MG tablet Take 6 tablets (60 mg total) by mouth daily for 4 days. 03/16/21 03/20/21 Yes Dakwon Wenberg, Kermit Balo, MD  albuterol (PROVENTIL HFA;VENTOLIN HFA) 108 (90 Base) MCG/ACT inhaler Inhale 2 puffs into the lungs every 6 (six) hours as needed for wheezing or shortness of breath. 12/14/18   [name], MD  aspirin 81 MG EC tablet Take 81 mg by mouth at bedtime.    [provider]  budesonide-formoterol (SYMBICORT) 160-4.5 MCG/ACT inhaler Inhale 2 puffs into the lungs 2 (two) times daily. Patient taking differently: Inhale 2 puffs into the lungs 2 (two) times daily. Pt uses as needed. 11/07/19   [name], MD  carvedilol (COREG) 6.25 MG tablet Take 1 tablet (6.25 mg total) by mouth 2 (two) times daily. 07/05/20 07/05/21  Rollene Rotunda, MD  Cholecalciferol (VITAMIN D) 50 MCG (2000 UT) CAPS Take 1 capsule (2,000 Units total) by mouth daily. 02/08/21   [name], MD  dicyclomine (BENTYL) 20 MG tablet Take 1 tablet (20 mg total) by mouth in the morning and at bedtime. Patient taking differently: Take 20 mg by mouth in the morning and at bedtime. As needed 11/21/19   Zehr, Shanda Bumps D, PA-C  doxycycline (VIBRA-TABS) 100 MG tablet TAKE ONE TABLET BY MOUTH TWICE A DAY 12/25/20   Judyann Munson, MD  ferrous sulfate 325 (65 FE) MG EC tablet Take 1 tablet (325 mg total) by mouth every Monday, Wednesday, and Friday. 02/08/21   [name], MD  fluconazole (DIFLUCAN) 200 MG tablet Take 200 mg by mouth daily. Daughter states patient is taking 1 in am    [provider]  FLUoxetine (PROZAC) 40 MG capsule Take 40 mg by mouth daily.  03/08/19   [provider]  furosemide (LASIX) 20 MG tablet Take 2 tablets (40 mg total) by  mouth daily. And as directed 12/12/20   Rollene Rotunda, MD  gabapentin (NEURONTIN) 100 MG capsule TAKE ONE TO TWO CAPSULES BY MOUTH AT BEDTIME 02/20/21   [name], MD  lamoTRIgine (LAMICTAL) 200 MG tablet Take 400 mg by mouth daily.    [provider]  levothyroxine (SYNTHROID) 150 MCG tablet TAKE ONE TABLET BY MOUTH AT BEDTIME Patient taking differently: Take 150 mcg by mouth daily before breakfast. Pt takes at bedtime. 03/19/20   [name], MD  metFORMIN (GLUCOPHAGE) 500 MG tablet Take 1 tablet (500 mg total) by mouth daily with breakfast. Patient taking differently: Take 500 mg by mouth daily with breakfast. Pt takes at bedtime 08/10/20   [name], MD  montelukast (SINGULAIR) 10 MG tablet TAKE ONE TABLET BY MOUTH AT BEDTIME 01/18/21   [name], MD  Multiple Vitamin (MULTIVITAMIN WITH MINERALS) TABS tablet Take 2 tablets by mouth daily.     [provider]  ondansetron (ZOFRAN ODT) 8 MG disintegrating tablet Take 1 tablet (8 mg total) by mouth 2 (two) times daily as needed for nausea or vomiting. 06/01/20   Zehr, Princella Pellegrini, PA-C  Oxcarbazepine (TRILEPTAL) 300 MG tablet Take 300 mg by mouth 2 (two) times daily. Pt takes 1 tablet at bedtime Patient not taking: Reported on 02/18/2021 05/31/20   [name], MD  pantoprazole (PROTONIX) 40 MG tablet TAKE ONE TABLET BY MOUTH TWICE A DAY 08/26/20   [name], MD  polyethylene glycol (MIRALAX / GLYCOLAX) 17 g packet Take 17 g by mouth 2 (two) times daily as needed for mild constipation. 11/14/19   Hughie Closs, MD  potassium chloride (KLOR-CON) 10 MEQ tablet Take 1 tablet (10 mEq total) by mouth daily. With lasix. May take an additional 10 meq when taking 60 meq of lasix 12/12/20   Rollene Rotunda, MD  prochlorperazine (COMPAZINE) 10 MG tablet Take 1 tablet (10 mg total) by mouth every 6 (six) hours as needed for nausea or vomiting. 11/21/19   Zehr, Princella Pellegrini, PA-C  promethazine (PHENERGAN) 25 MG  suppository Place 1 suppository (25 mg total) rectally every 6 (six) hours as needed for nausea or vomiting. 06/01/20   [name], MD  simvastatin (ZOCOR) 40 MG tablet TAKE ONE TABLET BY MOUTH EVERY EVENING 08/26/20   [name], MD  sucralfate (CARAFATE) 1 GM/10ML suspension Take 10 mLs (1 g total) by mouth 4 (four) times daily -  with meals and at bedtime. Patient taking differently: Take 1 g by mouth 4 (four) times daily -  with meals and at bedtime. Pt takes as needed. 06/20/20   [name], MD    Allergies    Gala Murdoch [fesoterodine], Cephalexin, Hydrocodone, Entresto [sacubitril-valsartan], Macrodantin [nitrofurantoin], Tolterodine tartrate, Risperidone and related, Seroquel [quetiapine fumarate], and Sulfa antibiotics  Review of Systems   Review of Systems  Constitutional:  Negative for chills and fever.  HENT:  Positive for congestion and sore throat.   Eyes:  Negative for pain and visual disturbance.  Respiratory:  Positive for shortness of breath. Negative for cough.   Cardiovascular:  Negative for chest pain and palpitations.  Gastrointestinal:  Negative for abdominal pain, nausea and vomiting.  Genitourinary:  Negative for dysuria and hematuria.  Musculoskeletal:  Negative for arthralgias and myalgias.  Skin:  Negative for color change and rash.  Neurological:  Negative for syncope and headaches.  All other systems reviewed and are negative.  Physical Exam Updated Vital Signs BP (!) 149/82 (BP Location: Left Arm)   Pulse 85   Temp 98.5 F (36.9 C) (Oral)   Resp 17   Ht 5\' 4"  (1.626 m)   Wt (!) 172.4 kg   LMP  (LMP Unknown)   SpO2 98%   BMI 65.23 kg/m   Physical Exam Constitutional:      General: She is not in acute distress.    Appearance: She is obese.  HENT:     Head: Normocephalic and atraumatic.  Eyes:     Conjunctiva/sclera: Conjunctivae normal.     Pupils: Pupils are equal, round, and reactive to light.  Cardiovascular:     Rate and  Rhythm: Normal rate and regular rhythm.  Pulmonary:     Effort: Pulmonary effort is normal. No respiratory distress.     Comments: 98% on 4L [place] Bilateral expiratory wheezing Abdominal:     General: There is no distension.     Tenderness: There is no abdominal tenderness.  Skin:    General: Skin is warm and dry.  Neurological:     General: No focal deficit present.     Mental Status: She is alert and oriented to person, place, and time. Mental status is at baseline.  Psychiatric:        Mood and Affect: Mood normal.        Behavior: Behavior normal.    ED Results / Procedures / Treatments   Labs (all labs ordered are listed, but only abnormal results are displayed) Labs Reviewed  RESP PANEL BY RT-PCR (FLU A&B, COVID) [id number]    EKG EKG Interpretation  Date/Time:  Friday March 15 2021 16:07:58 EDT Ventricular Rate:  86 PR Interval:  169 QRS Duration: 140 QT Interval:  421 QTC Calculation: 504 R Axis:   -48 Text Interpretation: Sinus rhythm Nonspecific IVCD with LAD LVH with secondary repolarization abnormality no significant change from May 12 2020 ecg, No STEMI Confirmed by Alvester Chourifan, Mikiah Demond [id number]) on 03/15/2021 4:32:26 PM  Radiology DG Chest Portable 1 View  Result Date: 03/15/2021 CLINICAL DATA:  Shortness of breath EXAM: PORTABLE CHEST 1 VIEW COMPARISON:  09/13/2020 FINDINGS: Stable cardiomegaly with central vascular congestion. Similar low lung volumes with basilar atelectasis. No new acute airspace process, collapse or consolidation. No effusion or pneumothorax. Trachea midline. Aorta atherosclerotic. Lower cervical fusion hardware noted. IMPRESSION: Stable cardiomegaly with vascular congestion and basilar atelectasis Aortic Atherosclerosis (ICD10-I70.0). Electronically Signed   By: Judie PetitM.  Shick M.D.   On: 03/15/2021 16:57    Procedures Procedures   Medications Ordered in ED Medications  albuterol (VENTOLIN HFA) 108 (90 Base) MCG/ACT inhaler 4 puff (4 puffs Inhalation  Given 03/15/21 1629)  predniSONE (DELTASONE) tablet 60 mg (60 mg Oral Given 03/15/21 1624)  ipratropium-albuterol (DUONEB) 0.5-2.5 (3) MG/3ML nebulizer solution 3 mL (3 mLs Nebulization Given 03/15/21 1731)    ED Course  I have reviewed the triage vital signs and the nursing notes.  Pertinent labs & imaging results that were available during my care of the patient were reviewed by me and considered in my medical decision making (see chart for details).  This patient complains of shortness of breath. This involves an extensive number of treatment options, and is a complaint that carries with it a high risk of complications and morbidity.  The differential diagnosis includes viral URI w/ COPD exacerbation as most likely cause  This presentation is strongly consistent with viral URI with two family members sick with URI symptoms over the past 2 weeks in her house.  This is less likely acute coronary syndrome with no chest pain or pressure, and in the setting of multiple sick family members.  Likewise this is less likely PE, acute anemia, aortic dissection, clinically for the same reasons.  I do think x-ray be reasonable to evaluate for PNA and PTX.  We will also start her on prednisone and gave her some albuterol puffs for COPD.  She has a nebulizer machine at home but does need a new prescription for the solution.  Will obtain a 2-hour COVID PCR and await results while she is here, as she may be eligible for antiviral medications if she is positive.   No signs or symptoms of worsening hypoxi or sepsis at this time. No known hx of CHF, but she does take 40 mg lasix daily for LE edema, and reports she produces "good amount of urine" with this.  Prior records reviewed, last echo on 02/20/20 (1 montha ago) with EF 60-65% and fairly unremarkable.  Last LHC on 11/09/20 showing normal coronary arteries, extensive mitral annular calcifications.  With this recent work-up, I think this is less likely acute  coronary syndrome or congestive heart failure.   Clinical Course as of 03/16/21 1050  Fri Mar 15, 2021  1706 Covid test is negative.  There is some vascular congestion on dg chest, mild and likely chronic.  She reports she makes copious urine with her current lasix dose and does not want to increase it - I think that's reasonable.  Based on her medical records and recent echo this is less likely acute CHF. [MT]    Clinical Course User Index [MT] Akia Montalban, Kermit BaloMatthew J, MD    Final Clinical Impression(s) /  ED Diagnoses Final diagnoses:  COPD exacerbation (HCC)  Viral URI with cough    Rx / DC Orders ED Discharge Orders          Ordered    predniSONE (DELTASONE) 10 MG tablet  Daily        03/15/21 1714    albuterol (PROVENTIL) (5 MG/ML) 0.5% nebulizer solution  Every 4 hours PRN        03/15/21 1714    albuterol (PROVENTIL) (2.5 MG/3ML) 0.083% nebulizer solution  Every 6 hours PRN        03/15/21 1729             Terald Sleeper, MD 03/16/21 1050

## 2021-03-15 NOTE — Telephone Encounter (Signed)
[facility] - Client TELEPHONE ADVICE RECORD AccessNurse Patient Name: Amy Barnes Demma Gender: Female DOB: 03-06-1951 Age: 70 Y 9 M 8 D Return Phone Number: [phone] (Primary), [phone] (Secondary) Address[place] [address] Client [facility] - Client Client Site  [facility] - Day Physician [name] - MD Contact Type Call Who Is Calling Patient / Member / Family / Caregiver Call Type Triage / Clinical Caller Name [name] Relationship To Patient Spouse Return Phone Number [phone] (Primary) Chief Complaint BREATHING - shortness of breath or sounds breathless Reason for Call Symptomatic / Request for Health Information Initial Comment Caller states his wife has been sick for 4 days with Sx of congestion, cough, sleeping trouble, new trouble breathing. Office has no appointments. Translation No Nurse Assessment Nurse: Elesa Hacker, RN, Nash Dimmer Date/Time (Eastern Time): 03/15/2021 9:25:45 AM Confirm and document reason for call. If symptomatic, describe symptoms. ---Caller states his wife has been sick for 4 days with Sx of congestion, cough, sleeping trouble, new trouble breathing. Office has no appointments. No temp. Advised that granddaughter and daughter were there and was sick last week. No test for Covid. Wife is on oxygen. States that she is on 4 lpm via [place]. O2 level at this time is 94%. Does the patient have any new or worsening symptoms? ---Yes Will a triage be completed? ---Yes Related visit to physician within the last 2 weeks? ---No Does the PT have any chronic conditions? (i.e. diabetes, asthma, this includes High risk factors for pregnancy, etc.) ---Yes List chronic conditions. ---COPD HTN Diabetes Asthma Is this a behavioral health or substance abuse call? ---No Guidelines Guideline Title Affirmed Question Affirmed Notes  Nurse Date/Time (Eastern Time) Oxygen Monitoring and Hypoxia SEVERE difficulty breathing (e.g., struggling for each breath, speaks in [place], RN, Nash Dimmer 03/15/2021 9:29:11 AM PLEASE NOTE: All timestamps contained within this report are represented as [place] Standard Time. CONFIDENTIALTY NOTICE: This fax transmission is intended only for the addressee. It contains information that is legally privileged, confidential or otherwise protected from use or disclosure. If you are not the intended recipient, you are strictly prohibited from reviewing, disclosing, copying using or disseminating any of this information or taking any action in reliance on or regarding this information. If you have received this fax in error, please notify us immediately by telephone so that we can arrange for its return to [place]. Phone: [phone], Toll-Free: [phone], Fax: [phone] Page: 2 of 2 Call Id: [id number] Guidelines Guideline Title Affirmed Question Affirmed Notes Nurse Date/Time Lamount Cohen Time) single words, pulse > 120) Disp. Time Lamount Cohen Time) Disposition Final User 03/15/2021 9:24:21 AM Send to Urgent Ernest Haber 03/15/2021 9:41:08 AM 911 Outcome Documentation Deaton, RN, Nash Dimmer Reason: EMS is on the way. 03/15/2021 9:30:49 AM Call EMS 911 Now Yes Deaton, RN, Cory Roughen Disagree/Comply Comply Caller Understands Yes PreDisposition Did not know what to do Care Advice Given Per Guideline CALL EMS 911 NOW: * Immediate medical attention is needed. You need to hang up and call 911 (or an ambulance). * Triager Discretion: I'll call you back in a few minutes to be sure you were able to reach them. CARE ADVICE given per Oxygen Monitoring and Hypoxia (Adult) guideline.

## 2021-03-26 ENCOUNTER — Ambulatory Visit (INDEPENDENT_AMBULATORY_CARE_PROVIDER_SITE_OTHER): Payer: Medicare Other

## 2021-03-26 DIAGNOSIS — E1142 Type 2 diabetes mellitus with diabetic polyneuropathy: Secondary | ICD-10-CM | POA: Diagnosis not present

## 2021-03-26 DIAGNOSIS — I5020 Unspecified systolic (congestive) heart failure: Secondary | ICD-10-CM | POA: Diagnosis not present

## 2021-03-26 NOTE — Chronic Care Management (AMB) (Addendum)
Chronic Care Management   CCM RN Visit Note  03/26/2021 Name: Amy Barnes MRN: [id number] DOB: 1951/07/10  Subjective: Amy Barnes is a 70 y.o. year old female who is a primary care patient of [name], MD. The care management team was consulted for assistance with disease management and care coordination needs.    Engaged with patient by telephone for follow up visit in response to provider referral for case management and/or care coordination services.   Consent to Services:  The patient was given information about Chronic Care Management services, agreed to services, and gave verbal consent prior to initiation of services.  Please see initial visit note for detailed documentation.   Patient agreed to services and verbal consent obtained.   Assessment: Review of patient past medical history, allergies, medications, health status, including review of consultants reports, laboratory and other test data, was performed as part of comprehensive evaluation and provision of chronic care management services.   SDOH (Social Determinants of Health) assessments and interventions performed:    CCM Care Plan  Allergies  Allergen Reactions   Toviaz [Fesoterodine] Nausea And Vomiting    Severe reaction - s/p several ER visits then hospitalization ?stress induced cardiomyopathy   Cephalexin Hives   Hydrocodone Other (See Comments)    Reaction:  Hallucinations    Entresto [Sacubitril-Valsartan]    Macrodantin [Nitrofurantoin]     Dyspnea, lips peeling   Tolterodine Tartrate Nausea And Vomiting   Risperidone And Related Other (See Comments)    Reaction:  Made pt excessively sleepy   Seroquel [Quetiapine Fumarate] Other (See Comments)    Reaction:  Made pt excessively sleepy   Sulfa Antibiotics Rash    Tolerated bactrim course well 06/2020    Outpatient Encounter Medications as of 03/26/2021  Medication Sig Note   albuterol (PROVENTIL HFA;VENTOLIN HFA) 108 (90 Base) MCG/ACT  inhaler Inhale 2 puffs into the lungs every 6 (six) hours as needed for wheezing or shortness of breath.    albuterol (PROVENTIL) (2.5 MG/3ML) 0.083% nebulizer solution Take 3 mLs (2.5 mg total) by nebulization every 6 (six) hours as needed for wheezing or shortness of breath.    albuterol (PROVENTIL) (5 MG/ML) 0.5% nebulizer solution Take 0.5 mLs (2.5 mg total) by nebulization every 4 (four) hours as needed for wheezing or shortness of breath.    aspirin 81 MG EC tablet Take 81 mg by mouth at bedtime.    budesonide-formoterol (SYMBICORT) 160-4.5 MCG/ACT inhaler Inhale 2 puffs into the lungs 2 (two) times daily. (Patient taking differently: Inhale 2 puffs into the lungs 2 (two) times daily. Pt uses as needed.) 02/18/2021: 2 puffs 1 time per day   carvedilol (COREG) 6.25 MG tablet Take 1 tablet (6.25 mg total) by mouth 2 (two) times daily.    Cholecalciferol (VITAMIN D) 50 MCG (2000 UT) CAPS Take 1 capsule (2,000 Units total) by mouth daily.    dicyclomine (BENTYL) 20 MG tablet Take 1 tablet (20 mg total) by mouth in the morning and at bedtime. (Patient taking differently: Take 20 mg by mouth in the morning and at bedtime. As needed)    doxycycline (VIBRA-TABS) 100 MG tablet TAKE ONE TABLET BY MOUTH TWICE A DAY    ferrous sulfate 325 (65 FE) MG EC tablet Take 1 tablet (325 mg total) by mouth every Monday, Wednesday, and Friday. 02/18/2021: Daughter states patient has not started yet. Daughter will pick up medicine for patient.    fluconazole (DIFLUCAN) 200 MG tablet Take 200 mg by mouth  daily. Daughter states patient is taking 1 in am 02/18/2021: As needed    FLUoxetine (PROZAC) 40 MG capsule Take 40 mg by mouth daily.     furosemide (LASIX) 20 MG tablet Take 2 tablets (40 mg total) by mouth daily. And as directed    gabapentin (NEURONTIN) 100 MG capsule TAKE ONE TO TWO CAPSULES BY MOUTH AT BEDTIME    lamoTRIgine (LAMICTAL) 200 MG tablet Take 400 mg by mouth daily.    levothyroxine (SYNTHROID) 150 MCG  tablet TAKE ONE TABLET BY MOUTH AT BEDTIME (Patient taking differently: Take 150 mcg by mouth daily before breakfast. Pt takes at bedtime.) 12/11/2020: Daughter states patient takes 1 at bedtime   metFORMIN (GLUCOPHAGE) 500 MG tablet Take 1 tablet (500 mg total) by mouth daily with breakfast. (Patient taking differently: Take 500 mg by mouth daily with breakfast. Pt takes at bedtime)    montelukast (SINGULAIR) 10 MG tablet TAKE ONE TABLET BY MOUTH AT BEDTIME    Multiple Vitamin (MULTIVITAMIN WITH MINERALS) TABS tablet Take 2 tablets by mouth daily.     ondansetron (ZOFRAN ODT) 8 MG disintegrating tablet Take 1 tablet (8 mg total) by mouth 2 (two) times daily as needed for nausea or vomiting.    Oxcarbazepine (TRILEPTAL) 300 MG tablet Take 300 mg by mouth 2 (two) times daily. Pt takes 1 tablet at bedtime (Patient not taking: Reported on 02/18/2021)    pantoprazole (PROTONIX) 40 MG tablet TAKE ONE TABLET BY MOUTH TWICE A DAY    polyethylene glycol (MIRALAX / GLYCOLAX) 17 g packet Take 17 g by mouth 2 (two) times daily as needed for mild constipation.    potassium chloride (KLOR-CON) 10 MEQ tablet Take 1 tablet (10 mEq total) by mouth daily. With lasix. May take an additional 10 meq when taking 60 meq of lasix    prochlorperazine (COMPAZINE) 10 MG tablet Take 1 tablet (10 mg total) by mouth every 6 (six) hours as needed for nausea or vomiting.    promethazine (PHENERGAN) 25 MG suppository Place 1 suppository (25 mg total) rectally every 6 (six) hours as needed for nausea or vomiting.    simvastatin (ZOCOR) 40 MG tablet TAKE ONE TABLET BY MOUTH EVERY EVENING    sucralfate (CARAFATE) 1 GM/10ML suspension Take 10 mLs (1 g total) by mouth 4 (four) times daily -  with meals and at bedtime. (Patient taking differently: Take 1 g by mouth 4 (four) times daily -  with meals and at bedtime. Pt takes as needed.)    No facility-administered encounter medications on file as of 03/26/2021.    Patient Active Problem  List   Diagnosis Date Noted   Medicare annual wellness visit, subsequent 02/09/2021   DNR (do not resuscitate) 05/31/2020   Chronic respiratory failure with hypoxia, on home O2 therapy (HCC) 05/04/2020   Obesity hypoventilation syndrome (HCC) 05/04/2020   LVH (left ventricular hypertrophy) 05/04/2020   Pneumonia due to COVID-19 virus 05/03/2020   Non-ischemic cardiomyopathy (HCC) 12/29/2019   HFrEF (heart failure with reduced ejection fraction) (HCC) 11/25/2019   UTI (urinary tract infection) 11/25/2019   NSTEMI (non-ST elevated myocardial infarction) (HCC) 11/09/2019   QT prolongation 11/09/2019   Hyponatremia 11/03/2019   Diffuse abdominal pain 11/03/2019   Nausea and vomiting 11/03/2019   Diminished pulses in lower extremity 08/11/2019   Osteopenia 08/11/2019   Wound infection complicating hardware (HCC) 04/01/2019   Lumbar adjacent segment disease with spondylolisthesis 03/23/2019   Neurogenic urinary incontinence 11/29/2018   Urinary incontinence 11/29/2018   Bladder  spasms 11/29/2018   Discogenic low back pain 11/29/2018   Chronic sacroiliac joint pain (Bilateral) (R>L) 11/29/2018   Spondylosis without myelopathy or radiculopathy, lumbar region 11/09/2018   Chronic low back pain (Bilateral) (L>R) w/o sciatica 11/09/2018   History of hip replacement (Left) 10/13/2018   History of back surgery 10/13/2018   Vitamin D insufficiency 10/13/2018   Osteoarthritis of facet joint of lumbar spine 10/13/2018   Osteoarthritis involving multiple joints 10/13/2018   Cervical radiculopathy (Left) 10/13/2018   Lumbar facet arthropathy (Bilateral) 10/13/2018   Lumbar facet syndrome (Bilateral) (L>R) 10/13/2018   Abnormal MRI, lumbar spine (09/16/2018) 10/13/2018   Lumbar nerve root compression (L4) (Left) 10/13/2018   Lumbar foraminal stenosis 10/13/2018   Chronic hip pain after total replacement x 3 (Left) 10/13/2018   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   Chronic low back pain  (Primary Area of Pain) (Bilateral) (L>R) w/ sciatica (Left) 09/14/2018   Chronic lower extremity pain (Secondary Area of Pain) (Left) 09/14/2018   Chronic hip pain (Tertiary Area of Pain) (Bilateral) (L>R) 09/14/2018   Chronic knee pain (Fourth Area of Pain) (Bilateral) (R>L) 09/14/2018   Polypharmacy 09/14/2018   Disorder of skeletal system 09/14/2018   Problems influencing health status 09/14/2018   Primary osteoarthritis of first carpometacarpal joint of left hand 04/20/2018   PAH (pulmonary artery hypertension) (HCC) 08/03/2017   Advanced care planning/counseling discussion 06/25/2017   Synovial cyst of lumbar facet joint 11/13/2016   CAD (coronary artery disease) 07/30/2016   Centrilobular emphysema (HCC) 07/30/2016   Ex-smoker 07/08/2016   Nonspecific abnormal electrocardiogram (ECG) (EKG) 07/08/2016   Chronic lumbar radicular pain (L4) (Bilateral) 06/09/2016   DOE (dyspnea on exertion) 05/22/2016   Degenerative joint disease (DJD) of hip 04/17/2016   Somatic dysfunction of sacroiliac joint 04/17/2016   S/P Lumbar Fusion (L3-4 PLIF) 04/17/2016   Cervical fusion syndrome 04/17/2016   Pedal edema 02/19/2016   Chronic pain syndrome 02/19/2016   Aortic atherosclerosis (HCC) 12/11/2015   MRSA (methicillin resistant Staphylococcus aureus) infection 10/11/2012   Infection or inflammatory reaction due to internal joint prosthesis (HCC) 12/03/2011   Morbid obesity with BMI of 60.0-69.9, adult (HCC) 05/13/2011   Essential hypertension 05/17/2010   Cervical radiculopathy (Right) 12/25/2009   Benign positional vertigo 07/21/2008   Hyperlipidemia associated with type 2 diabetes mellitus (HCC) 08/16/2007   Hypothyroidism 07/30/2007   DM type 2 with diabetic peripheral neuropathy (HCC) 07/30/2007   Normocytic anemia 07/30/2007   Bipolar disorder (HCC) 07/30/2007   Allergic rhinitis 07/30/2007   Asthma 07/30/2007   GERD 07/30/2007   Osteoarthritis 07/30/2007   OSA treated with BiPAP  07/30/2007    Conditions to be addressed/monitored:CHF and DMII  Care Plan : Heart Failure (Adult)  Updates made by Otho Ket, RN since 03/26/2021 12:00 AM   Problem: Disease Progression (Heart Failure)   Priority: Medium  Onset Date: 11/12/2020   Long-Range Goal: patient will verbalize heart failure home managment strategies   Start Date: 11/12/2020  Expected End Date: 05/29/2021  This Visit's Progress: On track  Recent Progress: On track  Priority: Medium  Current Barriers:  Patient reports having ED visit on 03/15/2021 due to several day history of cold /flu like symptoms. She states she had several family members that were sick.  Patient states her COVID test came back negative and she was diagnosed with having a virus.  Patient states she is feeling a little better. She reports she has completed the prednisone that was prescribed and is not taking the cough  medicine.  Patient states she noticed a slight increase in swelling in her lower extremities over the last few days so she took an extra lasix pill and potassium according to her action plan. Patient states the swelling has decreased.  Patient states she did not weigh this morning because she just woke up.  She reports her last weight within the past 2 days was 363 lbs.    Knowledge deficit related to basic heart failure pathophysiology and self care management  Cognitive Deficits: Patient reports having difficulty with focus/ concentration    Case Manager Clinical Goal(s):  patient will verbalize understanding of Heart Failure Action Plan and when to call doctor patient will take all Heart Failure medications as prescribed patient will weigh daily and record (notifying MD of 3 lb weight gain over night or 5 lb in a week) Patient will wear oxygen as advised by provider and check oxygen saturations daily.   Patient reports she continues to use her oxygen around the clock at 2 -4 liters.    Interventions:  Collaboration with  [name], MD regarding development and update of comprehensive plan of care as evidenced by provider attestation and co-signature Inter-disciplinary care team collaboration (see longitudinal plan of care) Provided verbal education on low sodium diet Reviewed Heart Failure Action Plan. Reinforced with patient need to weigh daily.  Heart failure symptoms reviewed.   Reviewed role of diuretics in prevention of fluid overload and management of heart failure:   Patient Goals/Self-Care Activities call office if you gain more than 3 pounds in one day or 5 pounds in one week  Continue to do ankle pumps when sitting Continue to keep legs up while sitting Continue to follow a low salt diet and adhere to fluid restriction recommendations.   Continue to watch for swelling in feet, ankles and legs every day (report to doctor for increase in swelling, weight gain, and/ or shortness of breath). Follow your doctors recommendation regarding increase swelling and / or weight gain.  Call your doctor for non emergent symptoms.  Call 911 for severe symptoms Continue to weigh daily and record weights.   Wear oxygen as advised by provider and check oxygen saturations daily Continue to take your medications as prescribed. Refill medication timely.  Follow Up Plan: The patient has been provided with contact information for the care management team and has been advised to call with any health related questions or concerns.  The care management team will reach out to the patient again over the next 45 days.       Care Plan : Diabetes Type 2 (Adult)  Updates made by Otho Ket, RN since 03/26/2021 12:00 AM   Problem: Glycemic Management (Diabetes, Type 2)   Priority: Medium   Long-Range Goal: Patient will verbalize home management strategies for diabetes   Start Date: 11/12/2020  Expected End Date: 05/29/2021  This Visit's Progress: On track  Recent Progress: On track  Priority: Medium  Objective:   Lab Results  Component Value Date   HGBA1C 7.9 (H) 02/01/2021   Lab Results  Component Value Date   CREATININE 0.62 02/01/2021   CREATININE 0.70 11/23/2020   CREATININE 0.83 06/15/2020  Current Barriers:  Knowledge Deficits related to basic Diabetes self care/management:  Patient reports having ED visit on 03/15/2021 due to several day history of cold /flu like symptoms. She states she had several family members that were sick.  Patient states her COVID test came back negative and she was diagnosed with having  a virus.  Patient states she is feeling a little better. She reports she has completed the prednisone that was prescribed and is no longer taking the cough medicine.  Patient states she did not check her blood sugar this morning because she just woke up.  She reports having a fasting blood sugar within the past week that was in the 300 range. She reports she has not checked her blood sugar since then.  Patient states she feels her blood sugar was up at the time due to being sick.    Case Manager Clinical Goal(s):  patient will demonstrate improved adherence to prescribed treatment plan for diabetes self care/management. Interventions:  Collaboration with [name], MD regarding development and update of comprehensive plan of care as evidenced by provider attestation and co-signature Inter-disciplinary care team collaboration (see longitudinal plan of care) Reviewed medications with and discussed importance of medication adherence.   Discussed plans with patient for ongoing care management follow up and provided patient with direct contact information for care management team Patient will monitor and record blood sugars at least 2-3 times per week.  RNCM advised patient to check her blood sugar today and return to checking her fasting blood sugars.  Explained to patient that blood sugars can increase with sickness and when taking certain medications.   Also explained to patient  importance of rechecking blood sugar after an elevated reading to determine results have returned to her normal range.  Patient verbalized understanding and agreement.   Patient will continue to adhere to a ADA/carb modified diet. Discussed importance of adhering to low carb/ diabetic diet.   Education article sent to patient in MyChart on Hyper/ Hypoglycemia.  Patient Goals/Self-Care Activities - Check blood sugar at least 2-3 times per week.  Always recheck blood sugar after an elevated reading. Review article sent to you in my chart on Hyperglycemia/ Hypoglycemia blood sugar management.  - Always check blood sugar if you feel it is too high or too low - Take the blood sugar meter to your doctor visits  - Continue to take medication as prescribed.  - Attend scheduled provider appointments - Continue to follow an ADA/carb modified (review article sent to you in your My Chart) Follow Up Plan: The patient has been provided with contact information for the care management team and has been advised to call with any health related questions or concerns.  The care management team will reach out to the patient again over the next 45 days.       Plan:The patient has been provided with contact information for the care management team and has been advised to call with any health related questions or concerns.  and The care management team will reach out to the patient again over the next 45 days. George Ina RN,BSN,CCM RN Case Manager [facility] [place]  [phone]

## 2021-03-26 NOTE — Patient Instructions (Addendum)
Visit Information:  Thank you for taking the time to speak with me today.   PATIENT GOALS:  Goals Addressed             This Visit's Progress    Monitor and Manage My Blood Sugar-Diabetes Type 2   On track    Timeframe:  Long-Range Goal Priority:  Medium Start Date:  11/12/2020                           Expected End Date: 6/31/22  Follow Up Date  04/30/2021   - Check blood sugar at least 2-3 times per week.  Always recheck blood sugar after an elevated reading. Review article sent to you in my chart on Hyperglycemia/ Hypoglycemia blood sugar management.  - Always check blood sugar if you feel it is too high or too low - Take the blood sugar meter to your doctor visits  - Continue to take medication as prescribed.  - Attend scheduled provider appointments - Continue to follow an ADA/carb modified (review article sent to you in your My Chart)   Why is this important?   Checking your blood sugar at home helps to keep it from getting very high or very low.  Writing the results in a diary or log helps the doctor know how to care for you.  Your blood sugar log should have the time, date and the results.  Also, write down the amount of insulin or other medicine that you take.  Other information, like what you ate, exercise done and how you were feeling, will also be helpful.           Track and Manage Fluids and Swelling-Heart Failure   On track    Timeframe:  Long-Range Goal Priority:  Medium Start Date:  11/12/2020                           Expected End Date: 05/29/2021                   Follow Up Date 04/30/2021    call office if you gain more than 3 pounds in one day or 5 pounds in one week   Continue to do ankle pumps when sitting  Continue to keep legs up while sitting  Continue to follow a low salt diet and adhere to fluid restriction recommendations.    Continue to watch for swelling in feet, ankles and legs every day (report to doctor for increase in swelling, weight gain,  and/ or shortness of breath). Follow your doctors recommendation regarding increase swelling and / or weight gain.  Call your doctor for non emergent symptoms.  Call 911 for severe symptoms  Continue to weigh daily and record weights.    Wear oxygen as advised by provider and check oxygen saturations daily  Continue to take your medications as prescribed. Refill medication timely.   Why is this important?   It is important to check your weight daily and watch how much salt and liquids you have.  It will help you to manage your heart failure.             Patient verbalizes understanding of instructions provided today and agrees to view in MyChart.   The patient has been provided with contact information for the care management team and has been advised to call with any health related questions or concerns.  The care management  team will reach out to the patient again over the next 45 days.   George Ina RN,BSN,CCM RN Case Manager [facility] [place]  [phone]

## 2021-04-02 DIAGNOSIS — Z20822 Contact with and (suspected) exposure to covid-19: Secondary | ICD-10-CM | POA: Diagnosis not present

## 2021-04-04 ENCOUNTER — Other Ambulatory Visit: Payer: Self-pay | Admitting: Family Medicine

## 2021-04-08 ENCOUNTER — Other Ambulatory Visit: Payer: Self-pay

## 2021-04-08 ENCOUNTER — Encounter: Payer: Self-pay | Admitting: Urology

## 2021-04-08 ENCOUNTER — Ambulatory Visit (INDEPENDENT_AMBULATORY_CARE_PROVIDER_SITE_OTHER): Payer: Medicare Other | Admitting: Urology

## 2021-04-08 VITALS — BP 129/80 | HR 77 | Ht 64.0 in | Wt 375.0 lb

## 2021-04-08 DIAGNOSIS — N3946 Mixed incontinence: Secondary | ICD-10-CM | POA: Diagnosis not present

## 2021-04-08 DIAGNOSIS — R300 Dysuria: Secondary | ICD-10-CM | POA: Diagnosis not present

## 2021-04-08 LAB — URINALYSIS, COMPLETE
Bilirubin, UA: NEGATIVE
Ketones, UA: NEGATIVE
Nitrite, UA: POSITIVE — AB
Protein,UA: NEGATIVE
RBC, UA: NEGATIVE
Specific Gravity, UA: 1.015 (ref 1.005–1.030)
Urobilinogen, Ur: 0.2 mg/dL (ref 0.2–1.0)
pH, UA: 7 (ref 5.0–7.5)

## 2021-04-08 LAB — MICROSCOPIC EXAMINATION: WBC, UA: >30 /hpf — AB (ref 0–5)

## 2021-04-08 MED ORDER — CIPROFLOXACIN HCL 250 MG PO TABS: 250.0000 mg | ORAL_TABLET | Freq: Two times a day (BID) | ORAL | 0 refills | Status: AC

## 2021-04-08 NOTE — Addendum Note (Signed)
Addended by: Lizbeth Bark on: 04/08/2021 11:43 AM   Modules accepted: Orders

## 2021-04-08 NOTE — Progress Notes (Signed)
04/08/2021 10:24 AM   Amy Barnes [birth year] [id number]  Referring provider: [name], MD [address] [place],  [place] [address]  Chief Complaint  Patient presents with   Follow-up    HPI: Patient saw Dr. Mena Goes 1 year ago.  She was on oxybutynin for high-volume urge incontinence.  She had a back injection in 2020.  Neurosurgery was thinking about back surgery.  She was given Toviaz.  Chart was not completely clear but I think her oxybutynin ER was increased to 10 mg and she may have been given Detrol.  Patient came in because she thought she had a bladder infection.  For 1 week she has burning and foul-smelling urine.  She gets infrequent infections.  She said the last overactive bladder medication made her sick to her stomach.  She uses many pads a day.  She uses a walker.  She has home oxygen and and she is morbidly obese.  She voids every 2 hours except when she takes her fluid pill   PMH: Past Medical History:  Diagnosis Date   Allergic rhinitis    Ambulates with cane    straight   Anemia    Anxiety    Asthma    seasonal   Bipolar affective disorder (HCC)    takes Synthroid meds for Bipolar   CAD (coronary artery disease) 07/2016   by CT scan   Carpal tunnel syndrome    had surgery but occasional still has some issues per patient   Cataract    Centrilobular emphysema (HCC) 07/2016   by CT scan - pt not aware of this   Constipation due to pain medication    Depression with anxiety    Diabetes mellitus    type 2 - no meds diet controlled   GERD (gastroesophageal reflux disease)    History of blood transfusion    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 - 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 PO   Hyperlipidemia    Hypertension    Hypothyroidism    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 01/16/2018   prevnar 13 - large localized reaction 12/2017   OSA (obstructive sleep apnea)    no longer using cpap, uses a bed that raises and lowers hob    Osteoarthritis    Osteopenia 08/11/2019   DEXA 07/2019 - T -1.1 R femur (osteopenia)   Pneumonia    Pneumonia due to COVID-19 virus 05/03/2020   Restless legs    Septic arthritis (HCC) 10/11/2012   Shingles 06/30/2016   Status post revision of total hip replacement bilateral   prosthetic infection R 2013, L 2015   Thoracic aortic atherosclerosis (HCC) 11/207   by CT    Surgical History: Past Surgical History:  Procedure Laterality Date   BREAST CYST ASPIRATION     CARPAL TUNNEL RELEASE Right 05/15/2011   CARPAL TUNNEL RELEASE Left 12/24/2017   Procedure: LEFT CARPAL TUNNEL RELEASE;  Surgeon: [name], MD;  Location: [facility];  Service: Orthopedics;  Laterality: Left;   CERVICAL FUSION  2012   C2/3/4   COLONOSCOPY WITH PROPOFOL N/A 12/31/2015   diverticulosis, int hem, o/w normal rpt 10 yrs (Rein)   ESOPHAGOGASTRODUODENOSCOPY (EGD) WITH PROPOFOL N/A 11/12/2019   Procedure: ESOPHAGOGASTRODUODENOSCOPY (EGD) WITH PROPOFOL;  Surgeon: Sherrilyn Rist, MD;  Location: [facility];  Service: Gastroenterology;  Laterality: N/A;   EYE SURGERY Bilateral    cataract surgery with lens implant   FOOT SURGERY  1982  bone spur   I & D EXTREMITY  09/06/2012   Budd Palmer, MD; Right;  I&D of right thigh   I & D EXTREMITY Left 07/2013   wound vac - daily doxycycline indefinitely   KNEE ARTHROSCOPY  09/01/2012   Dannielle Huh, MD;  Right   LEFT HEART CATH AND CORONARY ANGIOGRAPHY N/A 11/10/2019   Procedure: LEFT HEART CATH AND CORONARY ANGIOGRAPHY;  Surgeon: Lennette Bihari, MD;  Location: [facility];  Service: Cardiovascular;  Laterality: N/A;   LUMBAR FUSION  01/08/2012   L3-4   LUMBAR FUSION  02/2019   unexpectedly discovered MRSA infection - pus Lovell Sheehan)    LUMBAR LAMINECTOMY/DECOMPRESSION MICRODISCECTOMY N/A 11/13/2016   LAMINOTOMY/LAMINECTOMY LUMBAR FOUR LUMBAR FIVE  WITH RESECTION OF SYNOVIAL CYST;  Surgeon: Tressie Stalker, MD   NECK SURGERY     Herniated  disk C2,3,4   NOSE SURGERY     PARTIAL HYSTERECTOMY  1984   for mennorhagia, ovaries remain   REVISION TOTAL HIP ARTHROPLASTY Left 08/28/2011   TMJ ARTHROPLASTY  1982   TOTAL HIP ARTHROPLASTY Left 2002   TRIGGER FINGER RELEASE Right 05/15/2011   long finger    Home Medications:  Allergies as of 04/08/2021       Reactions   Toviaz [fesoterodine] Nausea And Vomiting   Severe reaction - s/p several ER visits then hospitalization ?stress induced cardiomyopathy   Cephalexin Hives   Hydrocodone Other (See Comments)   Reaction:  Hallucinations    Entresto [sacubitril-valsartan]    Macrodantin [nitrofurantoin]    Dyspnea, lips peeling   Tolterodine Tartrate Nausea And Vomiting   Risperidone And Related Other (See Comments)   Reaction:  Made pt excessively sleepy   Seroquel [quetiapine Fumarate] Other (See Comments)   Reaction:  Made pt excessively sleepy   Sulfa Antibiotics Rash   Tolerated bactrim course well 06/2020        Medication List        Accurate as of April 08, 2021 10:24 AM. If you have any questions, ask your nurse or doctor.          albuterol 108 (90 Base) MCG/ACT inhaler Commonly known as: VENTOLIN HFA Inhale 2 puffs into the lungs every 6 (six) hours as needed for wheezing or shortness of breath.   albuterol (5 MG/ML) 0.5% nebulizer solution Commonly known as: PROVENTIL Take 0.5 mLs (2.5 mg total) by nebulization every 4 (four) hours as needed for wheezing or shortness of breath.   albuterol (2.5 MG/3ML) 0.083% nebulizer solution Commonly known as: PROVENTIL Take 3 mLs (2.5 mg total) by nebulization every 6 (six) hours as needed for wheezing or shortness of breath.   aspirin 81 MG EC tablet Take 81 mg by mouth at bedtime.   budesonide-formoterol 160-4.5 MCG/ACT inhaler Commonly known as: SYMBICORT Inhale 2 puffs into the lungs 2 (two) times daily. What changed: additional instructions   carvedilol 6.25 MG tablet Commonly known as: COREG Take 1  tablet (6.25 mg total) by mouth 2 (two) times daily.   dicyclomine 20 MG tablet Commonly known as: BENTYL Take 1 tablet (20 mg total) by mouth in the morning and at bedtime. What changed: additional instructions   doxycycline 100 MG tablet Commonly known as: VIBRA-TABS TAKE ONE TABLET BY MOUTH TWICE A DAY   ferrous sulfate 325 (65 FE) MG EC tablet Take 1 tablet (325 mg total) by mouth every Monday, Wednesday, and Friday.   fluconazole 200 MG tablet Commonly known as: DIFLUCAN Take 200 mg by  mouth daily. Daughter states patient is taking 1 in am   FLUoxetine 40 MG capsule Commonly known as: PROZAC Take 40 mg by mouth daily.   furosemide 20 MG tablet Commonly known as: Lasix Take 2 tablets (40 mg total) by mouth daily. And as directed   gabapentin 100 MG capsule Commonly known as: NEURONTIN TAKE ONE TO TWO CAPSULES BY MOUTH AT BEDTIME   lamoTRIgine 200 MG tablet Commonly known as: LAMICTAL Take 400 mg by mouth daily.   levothyroxine 150 MCG tablet Commonly known as: SYNTHROID TAKE ONE TABLET BY MOUTH AT BEDTIME   metFORMIN 500 MG tablet Commonly known as: GLUCOPHAGE Take 1 tablet (500 mg total) by mouth daily with breakfast. What changed: additional instructions   montelukast 10 MG tablet Commonly known as: SINGULAIR TAKE ONE TABLET BY MOUTH AT BEDTIME   multivitamin with minerals Tabs tablet Take 2 tablets by mouth daily.   ondansetron 8 MG disintegrating tablet Commonly known as: Zofran ODT Take 1 tablet (8 mg total) by mouth 2 (two) times daily as needed for nausea or vomiting.   Oxcarbazepine 300 MG tablet Commonly known as: TRILEPTAL Take 300 mg by mouth 2 (two) times daily. Pt takes 1 tablet at bedtime   pantoprazole 40 MG tablet Commonly known as: PROTONIX TAKE ONE TABLET BY MOUTH TWICE A DAY   polyethylene glycol 17 g packet Commonly known as: MIRALAX / GLYCOLAX Take 17 g by mouth 2 (two) times daily as needed for mild constipation.   potassium  chloride 10 MEQ tablet Commonly known as: KLOR-CON Take 1 tablet (10 mEq total) by mouth daily. With lasix. May take an additional 10 meq when taking 60 meq of lasix   prochlorperazine 10 MG tablet Commonly known as: COMPAZINE Take 1 tablet (10 mg total) by mouth every 6 (six) hours as needed for nausea or vomiting.   promethazine 25 MG suppository Commonly known as: Phenergan Place 1 suppository (25 mg total) rectally every 6 (six) hours as needed for nausea or vomiting.   simvastatin 40 MG tablet Commonly known as: ZOCOR TAKE ONE TABLET BY MOUTH EVERY EVENING   sucralfate 1 GM/10ML suspension Commonly known as: Carafate Take 10 mLs (1 g total) by mouth 4 (four) times daily -  with meals and at bedtime. What changed: additional instructions   Vitamin D 50 MCG (2000 UT) Caps Take 1 capsule (2,000 Units total) by mouth daily.        Allergies:  Allergies  Allergen Reactions   Toviaz [Fesoterodine] Nausea And Vomiting    Severe reaction - s/p several ER visits then hospitalization ?stress induced cardiomyopathy   Cephalexin Hives   Hydrocodone Other (See Comments)    Reaction:  Hallucinations    Entresto [Sacubitril-Valsartan]    Macrodantin [Nitrofurantoin]     Dyspnea, lips peeling   Tolterodine Tartrate Nausea And Vomiting   Risperidone And Related Other (See Comments)    Reaction:  Made pt excessively sleepy   Seroquel [Quetiapine Fumarate] Other (See Comments)    Reaction:  Made pt excessively sleepy   Sulfa Antibiotics Rash    Tolerated bactrim course well 06/2020    Family History: Family History  Problem Relation Age of Onset   Aneurysm Father 54       brain   Alcohol abuse Father    Cancer Father        possibly   CAD Other        several siblings   Cancer Brother  prostate   Diabetes Brother    Diabetes Sister    Anesthesia problems Neg Hx    Hypotension Neg Hx    Malignant hyperthermia Neg Hx    Pseudochol deficiency Neg Hx    Breast  cancer Neg Hx     Social History:  reports that she quit smoking about 11 years ago. Her smoking use included cigarettes. She has a 60.00 pack-year smoking history. She has never used smokeless tobacco. She reports current alcohol use of about 2.0 standard drinks of alcohol per week. She reports that she does not use drugs.  ROS:                                        Physical Exam: BP 129/80   Pulse 77   Ht 5\' 4"  (1.626 m)   Wt (!) 170.1 kg   LMP  (LMP Unknown)   BMI 64.37 kg/m   Constitutional:  Alert and oriented, No acute distress. HEENT: [place] AT, moist mucus membranes.  Trachea midline, no masses. Cardiovascular: No clubbing, cyanosis, or edema.   Laboratory Data: Lab Results  Component Value Date   WBC 6.3 02/01/2021   HGB 10.6 (L) 02/01/2021   HCT 32.0 (L) 02/01/2021   MCV 84.5 02/01/2021   PLT 237.0 02/01/2021    Lab Results  Component Value Date   CREATININE 0.62 02/01/2021    No results found for: PSA  No results found for: TESTOSTERONE  Lab Results  Component Value Date   HGBA1C 7.9 (H) 02/01/2021    Urinalysis    Component Value Date/Time   COLORURINE YELLOW 05/05/2020 1444   APPEARANCEUR CLOUDY (A) 05/05/2020 1444   APPEARANCEUR Clear 03/04/2019 0948   LABSPEC 1.023 05/05/2020 1444   LABSPEC 1.017 09/25/2012 2123   PHURINE 7.0 05/05/2020 1444   GLUCOSEU NEGATIVE 05/05/2020 1444   GLUCOSEU NEGATIVE 11/21/2019 1054   HGBUR SMALL (A) 05/05/2020 1444   HGBUR negative 05/17/2010 0946   BILIRUBINUR negative 05/24/2020 1704   BILIRUBINUR Negative 03/04/2019 0948   BILIRUBINUR Negative 09/25/2012 2123   KETONESUR NEGATIVE 05/05/2020 1444   PROTEINUR Negative 05/24/2020 1704   PROTEINUR 100 (A) 05/05/2020 1444   UROBILINOGEN 0.2 05/24/2020 1704   UROBILINOGEN 0.2 11/21/2019 1054   NITRITE negative 05/24/2020 1704   NITRITE NEGATIVE 05/05/2020 1444   LEUKOCYTESUR Moderate (2+) (A) 05/24/2020 1704   LEUKOCYTESUR LARGE (A)  05/05/2020 1444   LEUKOCYTESUR Negative 09/25/2012 2123    Pertinent Imaging: Urine reviewed.  Urine sent for culture.  Chart reviewed  Assessment & Plan: Patient has multiple allergies including Toviaz Keflex Macrodantin and Detrol.  She is allergic to sulfa.  I called in ciprofloxacin 250 mg twice a day for 1 week.  Think we have to have reasonable treatment goals in regards to her incontinence.  Third line therapies would not be appropriate based on obesity.  Patient was catheterized for culture  1. Mixed incontinence urge and stress  - Urinalysis, Complete   No follow-ups on file.  2124, MD  [facility] [address], [address] [place], [place] [phone]

## 2021-04-10 DIAGNOSIS — Z20822 Contact with and (suspected) exposure to covid-19: Secondary | ICD-10-CM | POA: Diagnosis not present

## 2021-04-11 LAB — CULTURE, URINE COMPREHENSIVE

## 2021-04-28 ENCOUNTER — Other Ambulatory Visit: Payer: Self-pay | Admitting: Family Medicine

## 2021-04-30 ENCOUNTER — Ambulatory Visit (INDEPENDENT_AMBULATORY_CARE_PROVIDER_SITE_OTHER): Payer: Medicare Other

## 2021-04-30 DIAGNOSIS — E1142 Type 2 diabetes mellitus with diabetic polyneuropathy: Secondary | ICD-10-CM | POA: Diagnosis not present

## 2021-04-30 DIAGNOSIS — I5020 Unspecified systolic (congestive) heart failure: Secondary | ICD-10-CM | POA: Diagnosis not present

## 2021-04-30 NOTE — Patient Instructions (Signed)
Visit Information:  Thank you for taking the time to speak with me today.   PATIENT GOALS:  Goals Addressed             This Visit's Progress    Monitor and Manage My Blood Sugar-Diabetes Type 2   On track    Timeframe:  Long-Range Goal Priority:  Medium Start Date:  11/12/2020                           Expected End Date: 07/29/2021  Follow Up Date  06/18/2021  - Check blood sugar at least 2-3 times per week.  Always recheck blood sugar after an elevated  or low reading. Review article sent to you in my chart on Hyperglycemia/ Hypoglycemia blood sugar management. ( You are doing well checking your blood sugars consistently) - Always check blood sugar if you feel it is too high or too low:  Notify your doctor for consistent elevated readings or for readings less than 70. - Take the blood sugar meter  and/ or log to your doctor visits  - Continue to take medication as prescribed.  - Attend scheduled provider appointments - Continue to follow an ADA/carb modified (review article sent to you in your My Chart)   Why is this important?   Checking your blood sugar at home helps to keep it from getting very high or very low.  Writing the results in a diary or log helps the doctor know how to care for you.  Your blood sugar log should have the time, date and the results.  Also, write down the amount of insulin or other medicine that you take.  Other information, like what you ate, exercise done and how you were feeling, will also be helpful.          Track and Manage Fluids and Swelling-Heart Failure   On track    Timeframe:  Long-Range Goal Priority:  Medium Start Date:  11/12/2020                           Expected End Date: 07/29/2021                Follow Up Date 06/18/2021  call office if you gain more than 3 pounds in one day or 5 pounds in one week  Continue to do ankle pumps when sitting Continue to keep legs up while sitting Continue to follow a low salt diet and adhere to  fluid restriction recommendations.   Continue to watch for swelling in feet, ankles and legs every day (report to doctor for increase in swelling, weight gain, and/ or shortness of breath). Follow your doctors recommendation regarding increase swelling and / or weight gain.  Call your doctor for non emergent symptoms. Evaluate your symptoms based on your Heart failure action plan. If you are in the yellow zone call your doctor to report symptoms.   Call 911 for severe symptoms Continue to weigh daily and record weights.   Wear oxygen as advised by provider and check oxygen saturations daily Continue to take your medications as prescribed. Refill medication timely.    Why is this important?   It is important to check your weight daily and watch how much salt and liquids you have.  It will help you to manage your heart failure.            Patient verbalizes understanding of instructions  provided today and agrees to view in [place].   The patient has been provided with contact information for the care management team and has been advised to call with any health related questions or concerns.  The care management team will reach out to the patient again over the next 45 days.   [name] RN,BSN,CCM RN Case Manager [place]  [phone]

## 2021-04-30 NOTE — Chronic Care Management (AMB) (Signed)
Chronic Care Management   CCM RN Visit Note  04/30/2021 Name: Amy Barnes MRN: [id number] DOB: [birth year]  Subjective: Amy Barnes is a 70 y.o. year old female who is a primary care patient of [name], MD. The care management team was consulted for assistance with disease management and care coordination needs.    Engaged with patient by telephone for follow up visit in response to provider referral for case management and/or care coordination services.   Consent to Services:  The patient was given information about Chronic Care Management services, agreed to services, and gave verbal consent prior to initiation of services.  Please see initial visit note for detailed documentation.   Patient agreed to services and verbal consent obtained.   Assessment: Review of patient past medical history, allergies, medications, health status, including review of consultants reports, laboratory and other test data, was performed as part of comprehensive evaluation and provision of chronic care management services.   SDOH (Social Determinants of Health) assessments and interventions performed:    CCM Care Plan  Allergies  Allergen Reactions   Toviaz [Fesoterodine] Nausea And Vomiting    Severe reaction - s/p several ER visits then hospitalization ?stress induced cardiomyopathy   Cephalexin Hives   Hydrocodone Other (See Comments)    Reaction:  Hallucinations    Entresto [Sacubitril-Valsartan]    Macrodantin [Nitrofurantoin]     Dyspnea, lips peeling   Tolterodine Tartrate Nausea And Vomiting   Risperidone And Related Other (See Comments)    Reaction:  Made pt excessively sleepy   Seroquel [Quetiapine Fumarate] Other (See Comments)    Reaction:  Made pt excessively sleepy   Sulfa Antibiotics Rash    Tolerated bactrim course well 06/2020    Outpatient Encounter Medications as of 04/30/2021  Medication Sig Note   albuterol (PROVENTIL HFA;VENTOLIN HFA) 108 (90 Base) MCG/ACT inhaler  Inhale 2 puffs into the lungs every 6 (six) hours as needed for wheezing or shortness of breath.    albuterol (PROVENTIL) (2.5 MG/3ML) 0.083% nebulizer solution Take 3 mLs (2.5 mg total) by nebulization every 6 (six) hours as needed for wheezing or shortness of breath.    albuterol (PROVENTIL) (5 MG/ML) 0.5% nebulizer solution Take 0.5 mLs (2.5 mg total) by nebulization every 4 (four) hours as needed for wheezing or shortness of breath.    aspirin 81 MG EC tablet Take 81 mg by mouth at bedtime.    budesonide-formoterol (SYMBICORT) 160-4.5 MCG/ACT inhaler Inhale 2 puffs into the lungs 2 (two) times daily. (Patient taking differently: Inhale 2 puffs into the lungs 2 (two) times daily. Pt uses as needed.) 02/18/2021: 2 puffs 1 time per day   carvedilol (COREG) 6.25 MG tablet Take 1 tablet (6.25 mg total) by mouth 2 (two) times daily.    Cholecalciferol (VITAMIN D) 50 MCG (2000 UT) CAPS Take 1 capsule (2,000 Units total) by mouth daily.    dicyclomine (BENTYL) 20 MG tablet Take 1 tablet (20 mg total) by mouth in the morning and at bedtime. (Patient taking differently: Take 20 mg by mouth in the morning and at bedtime. As needed)    doxycycline (VIBRA-TABS) 100 MG tablet TAKE ONE TABLET BY MOUTH TWICE A DAY    ferrous sulfate 325 (65 FE) MG EC tablet Take 1 tablet (325 mg total) by mouth every Monday, Wednesday, and Friday. 02/18/2021: Daughter states patient has not started yet. Daughter will pick up medicine for patient.    fluconazole (DIFLUCAN) 200 MG tablet Take 200 mg by mouth  daily. Daughter states patient is taking 1 in am 02/18/2021: As needed    FLUoxetine (PROZAC) 40 MG capsule Take 40 mg by mouth daily.     furosemide (LASIX) 20 MG tablet Take 2 tablets (40 mg total) by mouth daily. And as directed    gabapentin (NEURONTIN) 100 MG capsule TAKE ONE TO TWO CAPSULES BY MOUTH AT BEDTIME    lamoTRIgine (LAMICTAL) 200 MG tablet Take 400 mg by mouth daily.    levothyroxine (SYNTHROID) 150 MCG tablet TAKE  ONE TABLET BY MOUTH AT BEDTIME    metFORMIN (GLUCOPHAGE) 500 MG tablet Take 1 tablet (500 mg total) by mouth daily with breakfast. (Patient taking differently: Take 500 mg by mouth daily with breakfast. Pt takes at bedtime)    montelukast (SINGULAIR) 10 MG tablet TAKE ONE TABLET BY MOUTH EACH NIGHT AT BEDTIME    Multiple Vitamin (MULTIVITAMIN WITH MINERALS) TABS tablet Take 2 tablets by mouth daily.     ondansetron (ZOFRAN ODT) 8 MG disintegrating tablet Take 1 tablet (8 mg total) by mouth 2 (two) times daily as needed for nausea or vomiting.    Oxcarbazepine (TRILEPTAL) 300 MG tablet Take 300 mg by mouth 2 (two) times daily. Pt takes 1 tablet at bedtime (Patient not taking: Reported on 02/18/2021)    pantoprazole (PROTONIX) 40 MG tablet TAKE ONE TABLET BY MOUTH TWICE A DAY    polyethylene glycol (MIRALAX / GLYCOLAX) 17 g packet Take 17 g by mouth 2 (two) times daily as needed for mild constipation.    potassium chloride (KLOR-CON) 10 MEQ tablet Take 1 tablet (10 mEq total) by mouth daily. With lasix. May take an additional 10 meq when taking 60 meq of lasix    prochlorperazine (COMPAZINE) 10 MG tablet Take 1 tablet (10 mg total) by mouth every 6 (six) hours as needed for nausea or vomiting.    promethazine (PHENERGAN) 25 MG suppository Place 1 suppository (25 mg total) rectally every 6 (six) hours as needed for nausea or vomiting.    simvastatin (ZOCOR) 40 MG tablet TAKE ONE TABLET BY MOUTH EVERY EVENING    sucralfate (CARAFATE) 1 GM/10ML suspension Take 10 mLs (1 g total) by mouth 4 (four) times daily -  with meals and at bedtime. (Patient taking differently: Take 1 g by mouth 4 (four) times daily -  with meals and at bedtime. Pt takes as needed.)    No facility-administered encounter medications on file as of 04/30/2021.    Patient Active Problem List   Diagnosis Date Noted   Medicare annual wellness visit, subsequent 02/09/2021   DNR (do not resuscitate) 05/31/2020   Chronic respiratory failure  with hypoxia, on home O2 therapy (HCC) 05/04/2020   Obesity hypoventilation syndrome (HCC) 05/04/2020   LVH (left ventricular hypertrophy) 05/04/2020   Pneumonia due to COVID-19 virus 05/03/2020   Non-ischemic cardiomyopathy (HCC) 12/29/2019   HFrEF (heart failure with reduced ejection fraction) (HCC) 11/25/2019   UTI (urinary tract infection) 11/25/2019   NSTEMI (non-ST elevated myocardial infarction) (HCC) 11/09/2019   QT prolongation 11/09/2019   Hyponatremia 11/03/2019   Diffuse abdominal pain 11/03/2019   Nausea and vomiting 11/03/2019   Diminished pulses in lower extremity 08/11/2019   Osteopenia 08/11/2019   Wound infection complicating hardware (HCC) 04/01/2019   Lumbar adjacent segment disease with spondylolisthesis 03/23/2019   Neurogenic urinary incontinence 11/29/2018   Urinary incontinence 11/29/2018   Bladder spasms 11/29/2018   Discogenic low back pain 11/29/2018   Chronic sacroiliac joint pain (Bilateral) (R>L) 11/29/2018  Spondylosis without myelopathy or radiculopathy, lumbar region 11/09/2018   Chronic low back pain (Bilateral) (L>R) w/o sciatica 11/09/2018   History of hip replacement (Left) 10/13/2018   History of back surgery 10/13/2018   Vitamin D insufficiency 10/13/2018   Osteoarthritis of facet joint of lumbar spine 10/13/2018   Osteoarthritis involving multiple joints 10/13/2018   Cervical radiculopathy (Left) 10/13/2018   Lumbar facet arthropathy (Bilateral) 10/13/2018   Lumbar facet syndrome (Bilateral) (L>R) 10/13/2018   Abnormal MRI, lumbar spine (09/16/2018) 10/13/2018   Lumbar nerve root compression (L4) (Left) 10/13/2018   Lumbar foraminal stenosis 10/13/2018   Chronic hip pain after total replacement x 3 (Left) 10/13/2018   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   Chronic low back pain (Primary Area of Pain) (Bilateral) (L>R) w/ sciatica (Left) 09/14/2018   Chronic lower extremity pain (Secondary Area of Pain) (Left) 09/14/2018   Chronic hip  pain (Tertiary Area of Pain) (Bilateral) (L>R) 09/14/2018   Chronic knee pain (Fourth Area of Pain) (Bilateral) (R>L) 09/14/2018   Polypharmacy 09/14/2018   Disorder of skeletal system 09/14/2018   Problems influencing health status 09/14/2018   Primary osteoarthritis of first carpometacarpal joint of left hand 04/20/2018   PAH (pulmonary artery hypertension) (HCC) 08/03/2017   Advanced care planning/counseling discussion 06/25/2017   Synovial cyst of lumbar facet joint 11/13/2016   CAD (coronary artery disease) 07/30/2016   Centrilobular emphysema (HCC) 07/30/2016   Ex-smoker 07/08/2016   Nonspecific abnormal electrocardiogram (ECG) (EKG) 07/08/2016   Chronic lumbar radicular pain (L4) (Bilateral) 06/09/2016   DOE (dyspnea on exertion) 05/22/2016   Degenerative joint disease (DJD) of hip 04/17/2016   Somatic dysfunction of sacroiliac joint 04/17/2016   S/P Lumbar Fusion (L3-4 PLIF) 04/17/2016   Cervical fusion syndrome 04/17/2016   Pedal edema 02/19/2016   Chronic pain syndrome 02/19/2016   Aortic atherosclerosis (HCC) 12/11/2015   MRSA (methicillin resistant Staphylococcus aureus) infection 10/11/2012   Infection or inflammatory reaction due to internal joint prosthesis (HCC) 12/03/2011   Morbid obesity with BMI of 60.0-69.9, adult (HCC) 05/13/2011   Essential hypertension 05/17/2010   Cervical radiculopathy (Right) 12/25/2009   Benign positional vertigo 07/21/2008   Hyperlipidemia associated with type 2 diabetes mellitus (HCC) 08/16/2007   Hypothyroidism 07/30/2007   DM type 2 with diabetic peripheral neuropathy (HCC) 07/30/2007   Normocytic anemia 07/30/2007   Bipolar disorder (HCC) 07/30/2007   Allergic rhinitis 07/30/2007   Asthma 07/30/2007   GERD 07/30/2007   Osteoarthritis 07/30/2007   OSA treated with BiPAP 07/30/2007    Conditions to be addressed/monitored:CHF and DMII  Care Plan : Heart Failure (Adult)  Updates made by Otho Ket, RN since 04/30/2021 12:00 AM      Problem: Disease Progression (Heart Failure)   Priority: Medium     Long-Range Goal: patient will verbalize heart failure home managment strategies   Start Date: 11/12/2020  Expected End Date: 07/29/2021  This Visit's Progress: On track  Recent Progress: On track  Priority: Medium  Note:   Current Barriers:  Patient states she is doing well.  She report the flu like symptoms she had in June 2022 have resolved. She reports recently seeing her urologist for urinary tract infection symptoms. She states she completed the antibiotics that were prescribed and denies any further symptoms at this time.  Patient states the swelling in her feet and legs have gotten much better. She states her weight today is 371.2 lbs, denies any increase shortness of breath and states she continues to wear her oxygen at 2  L.   Patient states she is in the [place] zone of her heart failure action plan.   Knowledge deficit related to basic heart failure pathophysiology and self care management  Cognitive Deficits: Patient reports having difficulty with focus/ concentration    Case Manager Clinical Goal(s):  patient will verbalize understanding of Heart Failure Action Plan and when to call doctor patient will take all Heart Failure medications as prescribed patient will weigh daily and record (notifying MD of 3 lb weight gain over night or 5 lb in a week) Patient will wear oxygen as advised by provider and check oxygen saturations daily.    Interventions:  Collaboration with [name], MD regarding development and update of comprehensive plan of care as evidenced by provider attestation and co-signature Inter-disciplinary care team collaboration (see longitudinal plan of care) Provided verbal education on low sodium diet Reviewed Heart Failure Action Plan.  Reviewed role of diuretics in prevention of fluid overload and management of heart failure  Patient Goals/Self-Care Activities call office if you gain  more than 3 pounds in one day or 5 pounds in one week  Continue to do ankle pumps when sitting Continue to keep legs up while sitting Continue to follow a low salt diet and adhere to fluid restriction recommendations.   Continue to watch for swelling in feet, ankles and legs every day (report to doctor for increase in swelling, weight gain, and/ or shortness of breath). Follow your doctors recommendation regarding increase swelling and / or weight gain.  Call your doctor for non emergent symptoms. Evaluate your symptoms based on your Heart failure action plan. If you are in the yellow zone call your doctor to report symptoms.   Call 911 for severe symptoms Continue to weigh daily and record weights.   Wear oxygen as advised by provider and check oxygen saturations daily Continue to take your medications as prescribed. Refill medication timely.  Follow Up Plan: The patient has been provided with contact information for the care management team and has been advised to call with any health related questions or concerns.  The care management team will reach out to the patient again over the next 45 days.          Care Plan : Diabetes Type 2 (Adult)  Updates made by Otho Ket, RN since 04/30/2021 12:00 AM     Problem: Glycemic Management (Diabetes, Type 2)   Priority: Medium     Long-Range Goal: Patient will verbalize home management strategies for diabetes   Start Date: 11/12/2020  Expected End Date: 07/29/2021  This Visit's Progress: On track  Recent Progress: On track  Priority: Medium  Note:   Objective:  Lab Results  Component Value Date   HGBA1C 7.9 (H) 02/01/2021   Lab Results  Component Value Date   CREATININE 0.62 02/01/2021   CREATININE 0.70 11/23/2020   CREATININE 0.83 06/15/2020  Current Barriers:  Knowledge Deficits related to basic Diabetes self care/management:  Patient reports her fasting blood sugar today is 288.  She states this has come down from where it  was. She denies having any blood sugar readings <70.   She states she is feeling much better today and denies any new or ongoing symptoms.       Case Manager Clinical Goal(s):  patient will demonstrate improved adherence to prescribed treatment plan for diabetes self care/management. Interventions:  Collaboration with [name], MD regarding development and update of comprehensive plan of care as evidenced by provider attestation and  co-signature Inter-disciplinary care team collaboration (see longitudinal plan of care) Reviewed medications with and discussed importance of medication adherence.   Discussed plans with patient for ongoing care management follow up and provided patient with direct contact information for care management team Patient will monitor and record blood sugars at least 2-3 times per week.     Patient will continue to adhere to a ADA/carb modified diet. Discussed importance of adhering to low carb/ diabetic diet.   Education article sent to patient in MyChart on Hyper/ Hypoglycemia: Reviewed Hyperglycemic management with patient.   Patient Goals/Self-Care Activities - Check blood sugar at least 2-3 times per week.  Always recheck blood sugar after an elevated  or low reading. Review article sent to you in my chart on Hyperglycemia/ Hypoglycemia blood sugar management. ( You are doing well checking your blood sugars consistently) - Always check blood sugar if you feel it is too high or too low:  Notify your doctor for consistent elevated readings or for readings less than 70. - Take the blood sugar meter  and/ or log to your doctor visits  - Continue to take medication as prescribed.  - Attend scheduled provider appointments - Continue to follow an ADA/carb modified (review article sent to you in your My Chart) Follow Up Plan: The patient has been provided with contact information for the care management team and has been advised to call with any health related  questions or concerns.  The care management team will reach out to the patient again over the next 45 days.           Plan:The patient has been provided with contact information for the care management team and has been advised to call with any health related questions or concerns.  and The care management team will reach out to the patient again over the next 45 days. George Ina RN,BSN,CCM RN Case Manager [facility] [place]  [phone]

## 2021-05-01 DIAGNOSIS — Z20822 Contact with and (suspected) exposure to covid-19: Secondary | ICD-10-CM | POA: Diagnosis not present

## 2021-05-03 ENCOUNTER — Telehealth: Payer: Self-pay

## 2021-05-03 NOTE — Telephone Encounter (Signed)
Amy Barnes with [facility] left voicemail on triage.  She is following up on an O2 request she faxed to Dr. Reece Agar.  She is requesting O2 orders and office note be faxed to [phone].  Amy Barnes's return phone number is [phone].

## 2021-05-03 NOTE — Telephone Encounter (Addendum)
Received faxed oxygen order/CMN from Charlsie Quest with [facility].    Lvm asking pt to call back.  Need to know if she is still using oxygen.   Placed form in Dr. Timoteo Expose box.

## 2021-05-06 NOTE — Telephone Encounter (Signed)
Sarah checking on O2 orders.  States pt contacted them about getting portable O2.

## 2021-05-07 ENCOUNTER — Other Ambulatory Visit: Payer: Self-pay | Admitting: *Deleted

## 2021-05-07 NOTE — Telephone Encounter (Signed)
Sarah with [facility] [employer] called stating that they have faxed over a form to be completed and faxed to them regarding oxygen.  Maralyn Sago stated that they need office notes and the form faxed back to them at [phone]. If there is an order in the system they requested it be faxed to them until they can get the form back. Any questions call [phone]

## 2021-05-07 NOTE — Telephone Encounter (Signed)
See other request from [facility].

## 2021-05-07 NOTE — Telephone Encounter (Signed)
Sorry I thought we were waiting for a patient call back. I have filled out form and will place in Lisa's box tomorrow.

## 2021-05-08 NOTE — Telephone Encounter (Addendum)
Faxed form to [phone], attn:  Charlsie Quest.

## 2021-05-23 DIAGNOSIS — F3181 Bipolar II disorder: Secondary | ICD-10-CM | POA: Diagnosis not present

## 2021-05-24 ENCOUNTER — Telehealth: Payer: 59 | Admitting: Internal Medicine

## 2021-06-04 ENCOUNTER — Encounter: Payer: Self-pay | Admitting: Internal Medicine

## 2021-06-04 ENCOUNTER — Other Ambulatory Visit: Payer: Self-pay

## 2021-06-04 ENCOUNTER — Telehealth (INDEPENDENT_AMBULATORY_CARE_PROVIDER_SITE_OTHER): Payer: Medicare Other | Admitting: Internal Medicine

## 2021-06-04 DIAGNOSIS — B372 Candidiasis of skin and nail: Secondary | ICD-10-CM | POA: Diagnosis not present

## 2021-06-04 DIAGNOSIS — M4626 Osteomyelitis of vertebra, lumbar region: Secondary | ICD-10-CM

## 2021-06-04 DIAGNOSIS — B373 Candidiasis of vulva and vagina: Secondary | ICD-10-CM | POA: Diagnosis not present

## 2021-06-04 DIAGNOSIS — B3731 Acute candidiasis of vulva and vagina: Secondary | ICD-10-CM

## 2021-06-04 NOTE — Progress Notes (Signed)
Virtual Visit via Telephone Note  I connected with [name] on 06/04/21 at  2:45 PM EDT by telephone and verified that I am speaking with the correct person using two identifiers.  Location: Patient:  at home Provider: at clinic   I discussed the limitations, risks, security and privacy concerns of performing an evaluation and management service by telephone and the availability of in person appointments. I also discussed with the patient that there may be a patient responsible charge related to this service. The patient expressed understanding and agreed to proceed.   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 This past February had increase candidal skin infection where we decrease her doxy dose  to 100mg  daily plus a short course of fluconazole to see if she had some relief plus continue on topical treatment. March, her daughter, dispenses her medication on doxy 100mg  bid. She feels her cutaneous fungal infection is improving. She had bladder infection in march but    ROS: 12 point ROS is negative with exception to headache - takes heat pack for improvement  Observations/Objective:  Fluent speech  Assessment and Plan: Continue doxy 100mg  bid x 6 months, reassess at that time Cutaneous fungal infection = appears improved/resolved at this time point. If has recurrence, may benefit from another course of oral fluconazole.   Follow Up Instructions:  continue on oral doxycycline 100mg  bid.     I discussed the assessment and treatment plan with the patient. The patient was provided an opportunity to ask questions and all were answered. The patient agreed with the plan and demonstrated an understanding of the instructions.   The patient was advised to call back or seek an in-person evaluation if the symptoms worsen or if the condition fails to improve as  anticipated.  I provided 10 minutes of non-face-to-face time during this encounter.   [name], MD

## 2021-06-14 ENCOUNTER — Ambulatory Visit: Payer: Medicare Other | Admitting: Family Medicine

## 2021-06-18 ENCOUNTER — Telehealth: Payer: Self-pay

## 2021-06-18 ENCOUNTER — Telehealth: Payer: Medicare Other

## 2021-06-18 ENCOUNTER — Other Ambulatory Visit: Payer: Self-pay

## 2021-06-18 NOTE — Telephone Encounter (Signed)
  Care Management   Follow Up Note   06/18/2021 Name: Amy Barnes MRN: [id number] DOB: 04-Jul-1951   Referred by: [name], MD Reason for referral : Chronic Care Management (HF, DMII)   An unsuccessful telephone outreach was attempted today. The patient was referred to the case management team for assistance with care management and care coordination. Telephone call x 2.  Unable to leave message due to voice mail box being full.   Follow Up Plan: RNCM will attempt telephone call to patient within 30 day.  George Ina RN,BSN,CCM RN Case Manager [facility] [place]  [phone]

## 2021-06-20 ENCOUNTER — Other Ambulatory Visit: Payer: Self-pay | Admitting: Cardiology

## 2021-06-21 ENCOUNTER — Ambulatory Visit (INDEPENDENT_AMBULATORY_CARE_PROVIDER_SITE_OTHER): Payer: Medicare Other

## 2021-06-21 ENCOUNTER — Other Ambulatory Visit: Payer: Self-pay

## 2021-06-21 DIAGNOSIS — I5020 Unspecified systolic (congestive) heart failure: Secondary | ICD-10-CM

## 2021-06-21 DIAGNOSIS — E1142 Type 2 diabetes mellitus with diabetic polyneuropathy: Secondary | ICD-10-CM

## 2021-06-21 NOTE — Patient Instructions (Signed)
Visit Information: Thank you for taking the time to speak with me today.   PATIENT GOALS:  Goals Addressed             This Visit's Progress    Monitor and Manage My Blood Sugar-Diabetes Type 2   On track    Timeframe:  Long-Range Goal Priority:  Medium Start Date:  11/12/2020                           Expected End Date: 09/27/2021  Follow Up Date 09/06/2021  - Continue to check blood sugar at least 2-3 times per week.  Always recheck blood sugar after an elevated  or low reading. Review article sent to you in my chart on Hyperglycemia/ Hypoglycemia blood sugar management. ( You are doing well checking your blood sugars consistently) - Always check blood sugar if you feel it is too high or too low:  Notify your doctor for consistent elevated readings or for readings less than 70 or greater than 200's consistently.  - Take the blood sugar meter  and/ or log to your doctor visits  - Continue to take medication as prescribed and refill timely.  - Attend scheduled provider appointments - Continue to follow an ADA/carb modified (review article sent to you in your My Chart)   Why is this important?   Checking your blood sugar at home helps to keep it from getting very high or very low.  Writing the results in a diary or log helps the doctor know how to care for you.  Your blood sugar log should have the time, date and the results.  Also, write down the amount of insulin or other medicine that you take.  Other information, like what you ate, exercise done and how you were feeling, will also be helpful.          Track and Manage Fluids and Swelling-Heart Failure   On track    Timeframe:  Long-Range Goal Priority:  Medium Start Date:  11/12/2020                           Expected End Date: 09/27/2021               Follow Up Date 09/06/2021   Continue to weigh daily and record.  Call office if you gain more than 3 pounds in one day or 5 pounds in one week  Continue to do ankle pumps  when sitting Continue to keep legs up while sitting Continue to follow a low salt diet and adhere to fluid restriction recommendations.   Continue to watch for swelling in feet, ankles and legs every day (report to doctor for increase in swelling, weight gain, and/ or shortness of breath). Follow your doctors recommendation regarding increase swelling and / or weight gain.  Call your doctor for non emergent symptoms. Evaluate your symptoms based on your Heart failure action plan. If you are in the yellow zone call your doctor to report symptoms.   Call 911 for severe symptoms Wear oxygen as advised by provider and check oxygen saturations daily Continue to take your medications as prescribed. Refill medication timely.   Why is this important?   It is important to check your weight daily and watch how much salt and liquids you have.  It will help you to manage your heart failure.  Patient verbalizes understanding of instructions provided today and agrees to view in MyChart.   The patient has been provided with contact information for the care manage 2-3 months.    George Ina RN,BSN,CCM RN Case Manager [facility] [place] [place]  [phone]

## 2021-06-21 NOTE — Chronic Care Management (AMB) (Signed)
Chronic Care Management   CCM RN Visit Note  06/21/2021 Name: Amy Barnes MRN: [id number] DOB: 01/18/1951  Subjective: Amy Barnes is a 70 y.o. year old female who is a primary care patient of [name], MD. The care management team was consulted for assistance with disease management and care coordination needs.    Engaged with patient by telephone for follow up visit in response to provider referral for case management and/or care coordination services.   Consent to Services:  The patient was given information about Chronic Care Management services, agreed to services, and gave verbal consent prior to initiation of services.  Please see initial visit note for detailed documentation.   Patient agreed to services and verbal consent obtained.   Assessment: Review of patient past medical history, allergies, medications, health status, including review of consultants reports, laboratory and other test data, was performed as part of comprehensive evaluation and provision of chronic care management services.   SDOH (Social Determinants of Health) assessments and interventions performed:    CCM Care Plan  Allergies  Allergen Reactions   Toviaz [Fesoterodine] Nausea And Vomiting    Severe reaction - s/p several ER visits then hospitalization ?stress induced cardiomyopathy   Cephalexin Hives   Hydrocodone Other (See Comments)    Reaction:  Hallucinations    Entresto [Sacubitril-Valsartan]    Macrodantin [Nitrofurantoin]     Dyspnea, lips peeling   Tolterodine Tartrate Nausea And Vomiting   Risperidone And Related Other (See Comments)    Reaction:  Made pt excessively sleepy   Seroquel [Quetiapine Fumarate] Other (See Comments)    Reaction:  Made pt excessively sleepy   Sulfa Antibiotics Rash    Tolerated bactrim course well 06/2020    Outpatient Encounter Medications as of 06/21/2021  Medication Sig Note   albuterol (PROVENTIL HFA;VENTOLIN HFA) 108 (90 Base) MCG/ACT  inhaler Inhale 2 puffs into the lungs every 6 (six) hours as needed for wheezing or shortness of breath.    albuterol (PROVENTIL) (2.5 MG/3ML) 0.083% nebulizer solution Take 3 mLs (2.5 mg total) by nebulization every 6 (six) hours as needed for wheezing or shortness of breath.    albuterol (PROVENTIL) (5 MG/ML) 0.5% nebulizer solution Take 0.5 mLs (2.5 mg total) by nebulization every 4 (four) hours as needed for wheezing or shortness of breath.    aspirin 81 MG EC tablet Take 81 mg by mouth at bedtime.    budesonide-formoterol (SYMBICORT) 160-4.5 MCG/ACT inhaler Inhale 2 puffs into the lungs 2 (two) times daily. (Patient taking differently: Inhale 2 puffs into the lungs 2 (two) times daily. Pt uses as needed.) 02/18/2021: 2 puffs 1 time per day   carvedilol (COREG) 6.25 MG tablet Take 1 tablet (6.25 mg total) by mouth 2 (two) times daily.    Cholecalciferol (VITAMIN D) 50 MCG (2000 UT) CAPS Take 1 capsule (2,000 Units total) by mouth daily.    dicyclomine (BENTYL) 20 MG tablet Take 1 tablet (20 mg total) by mouth in the morning and at bedtime. (Patient taking differently: Take 20 mg by mouth in the morning and at bedtime. As needed)    doxycycline (VIBRA-TABS) 100 MG tablet TAKE ONE TABLET BY MOUTH TWICE A DAY    ferrous sulfate 325 (65 FE) MG EC tablet Take 1 tablet (325 mg total) by mouth every Monday, Wednesday, and Friday. 02/18/2021: Daughter states patient has not started yet. Daughter will pick up medicine for patient.    fluconazole (DIFLUCAN) 200 MG tablet Take 200 mg by mouth  daily. Daughter states patient is taking 1 in am 02/18/2021: As needed    FLUoxetine (PROZAC) 40 MG capsule Take 40 mg by mouth daily.     furosemide (LASIX) 20 MG tablet Take 2 tablets (40 mg total) by mouth daily. And as directed    gabapentin (NEURONTIN) 100 MG capsule TAKE ONE TO TWO CAPSULES BY MOUTH AT BEDTIME    lamoTRIgine (LAMICTAL) 200 MG tablet Take 400 mg by mouth daily.    levothyroxine (SYNTHROID) 150 MCG  tablet TAKE ONE TABLET BY MOUTH AT BEDTIME    metFORMIN (GLUCOPHAGE) 500 MG tablet Take 1 tablet (500 mg total) by mouth daily with breakfast. (Patient taking differently: Take 500 mg by mouth daily with breakfast. Pt takes at bedtime)    montelukast (SINGULAIR) 10 MG tablet TAKE ONE TABLET BY MOUTH EACH NIGHT AT BEDTIME    Multiple Vitamin (MULTIVITAMIN WITH MINERALS) TABS tablet Take 2 tablets by mouth daily.     ondansetron (ZOFRAN ODT) 8 MG disintegrating tablet Take 1 tablet (8 mg total) by mouth 2 (two) times daily as needed for nausea or vomiting.    Oxcarbazepine (TRILEPTAL) 300 MG tablet Take 300 mg by mouth 2 (two) times daily. Pt takes 1 tablet at bedtime (Patient not taking: Reported on 02/18/2021)    pantoprazole (PROTONIX) 40 MG tablet TAKE ONE TABLET BY MOUTH TWICE A DAY    polyethylene glycol (MIRALAX / GLYCOLAX) 17 g packet Take 17 g by mouth 2 (two) times daily as needed for mild constipation.    potassium chloride (KLOR-CON) 10 MEQ tablet Take 1 tablet (10 mEq total) by mouth daily. With lasix. May take an additional 10 meq when taking 60 meq of lasix    prochlorperazine (COMPAZINE) 10 MG tablet Take 1 tablet (10 mg total) by mouth every 6 (six) hours as needed for nausea or vomiting.    promethazine (PHENERGAN) 25 MG suppository Place 1 suppository (25 mg total) rectally every 6 (six) hours as needed for nausea or vomiting.    simvastatin (ZOCOR) 40 MG tablet TAKE ONE TABLET BY MOUTH EVERY EVENING    sucralfate (CARAFATE) 1 GM/10ML suspension Take 10 mLs (1 g total) by mouth 4 (four) times daily -  with meals and at bedtime. (Patient taking differently: Take 1 g by mouth 4 (four) times daily -  with meals and at bedtime. Pt takes as needed.)    No facility-administered encounter medications on file as of 06/21/2021.    Patient Active Problem List   Diagnosis Date Noted   Medicare annual wellness visit, subsequent 02/09/2021   DNR (do not resuscitate) 05/31/2020   Chronic  respiratory failure with hypoxia, on home O2 therapy (HCC) 05/04/2020   Obesity hypoventilation syndrome (HCC) 05/04/2020   LVH (left ventricular hypertrophy) 05/04/2020   Pneumonia due to COVID-19 virus 05/03/2020   Non-ischemic cardiomyopathy (HCC) 12/29/2019   HFrEF (heart failure with reduced ejection fraction) (HCC) 11/25/2019   UTI (urinary tract infection) 11/25/2019   NSTEMI (non-ST elevated myocardial infarction) (HCC) 11/09/2019   QT prolongation 11/09/2019   Hyponatremia 11/03/2019   Diffuse abdominal pain 11/03/2019   Nausea and vomiting 11/03/2019   Diminished pulses in lower extremity 08/11/2019   Osteopenia 08/11/2019   Wound infection complicating hardware (HCC) 04/01/2019   Lumbar adjacent segment disease with spondylolisthesis 03/23/2019   Neurogenic urinary incontinence 11/29/2018   Urinary incontinence 11/29/2018   Bladder spasms 11/29/2018   Discogenic low back pain 11/29/2018   Chronic sacroiliac joint pain (Bilateral) (R>L) 11/29/2018  Spondylosis without myelopathy or radiculopathy, lumbar region 11/09/2018   Chronic low back pain (Bilateral) (L>R) w/o sciatica 11/09/2018   History of hip replacement (Left) 10/13/2018   History of back surgery 10/13/2018   Vitamin D insufficiency 10/13/2018   Osteoarthritis of facet joint of lumbar spine 10/13/2018   Osteoarthritis involving multiple joints 10/13/2018   Cervical radiculopathy (Left) 10/13/2018   Lumbar facet arthropathy (Bilateral) 10/13/2018   Lumbar facet syndrome (Bilateral) (L>R) 10/13/2018   Abnormal MRI, lumbar spine (09/16/2018) 10/13/2018   Lumbar nerve root compression (L4) (Left) 10/13/2018   Lumbar foraminal stenosis 10/13/2018   Chronic hip pain after total replacement x 3 (Left) 10/13/2018   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   Chronic low back pain (Primary Area of Pain) (Bilateral) (L>R) w/ sciatica (Left) 09/14/2018   Chronic lower extremity pain (Secondary Area of Pain) (Left)  09/14/2018   Chronic hip pain (Tertiary Area of Pain) (Bilateral) (L>R) 09/14/2018   Chronic knee pain (Fourth Area of Pain) (Bilateral) (R>L) 09/14/2018   Polypharmacy 09/14/2018   Disorder of skeletal system 09/14/2018   Problems influencing health status 09/14/2018   Primary osteoarthritis of first carpometacarpal joint of left hand 04/20/2018   PAH (pulmonary artery hypertension) (HCC) 08/03/2017   Advanced care planning/counseling discussion 06/25/2017   Synovial cyst of lumbar facet joint 11/13/2016   CAD (coronary artery disease) 07/30/2016   Centrilobular emphysema (HCC) 07/30/2016   Ex-smoker 07/08/2016   Nonspecific abnormal electrocardiogram (ECG) (EKG) 07/08/2016   Chronic lumbar radicular pain (L4) (Bilateral) 06/09/2016   DOE (dyspnea on exertion) 05/22/2016   Degenerative joint disease (DJD) of hip 04/17/2016   Somatic dysfunction of sacroiliac joint 04/17/2016   S/P Lumbar Fusion (L3-4 PLIF) 04/17/2016   Cervical fusion syndrome 04/17/2016   Pedal edema 02/19/2016   Chronic pain syndrome 02/19/2016   Aortic atherosclerosis (HCC) 12/11/2015   MRSA (methicillin resistant Staphylococcus aureus) infection 10/11/2012   Infection or inflammatory reaction due to internal joint prosthesis (HCC) 12/03/2011   Morbid obesity with BMI of 60.0-69.9, adult (HCC) 05/13/2011   Essential hypertension 05/17/2010   Cervical radiculopathy (Right) 12/25/2009   Benign positional vertigo 07/21/2008   Hyperlipidemia associated with type 2 diabetes mellitus (HCC) 08/16/2007   Hypothyroidism 07/30/2007   DM type 2 with diabetic peripheral neuropathy (HCC) 07/30/2007   Normocytic anemia 07/30/2007   Bipolar disorder (HCC) 07/30/2007   Allergic rhinitis 07/30/2007   Asthma 07/30/2007   GERD 07/30/2007   Osteoarthritis 07/30/2007   OSA treated with BiPAP 07/30/2007    Conditions to be addressed/monitored:CHF and DMII  Care Plan : Heart Failure (Adult)  Updates made by Otho Ket,  RN since 06/21/2021 12:00 AM     Problem: Disease Progression (Heart Failure)   Priority: Medium     Long-Range Goal: patient will verbalize heart failure home managment strategies   Start Date: 11/12/2020  Expected End Date: 09/27/2021  This Visit's Progress: On track  Recent Progress: On track  Priority: Medium  Note:   Current Barriers:  Patient denies any swelling in ankles/ feet/ legs.  She reports some increased shortness of breath. She feel this is due to seasonal allergies. She reports using her inhaler 2 times per day and denies nebulizer use.  RNCM encouraged patient to use nebulizer as prescribed by her doctor for increase symptoms.  Patient states she continues oxygen usage at 2- 2 1/2%. Reports current weight 377 lbs.    Knowledge deficit related to basic heart failure pathophysiology and self care management  Cognitive Deficits:  Patient reports having difficulty with focus/ concentration    Case Manager Clinical Goal(s):  patient will verbalize understanding of Heart Failure Action Plan and when to call doctor patient will take all Heart Failure medications as prescribed patient will weigh daily and record (notifying MD of 3 lb weight gain over night or 5 lb in a week) Patient will wear oxygen as advised by provider and check oxygen saturations daily.    Interventions:  Collaboration with [name], MD regarding development and update of comprehensive plan of care as evidenced by provider attestation and co-signature Inter-disciplinary care team collaboration (see longitudinal plan of care) Provided encouragement to continue a low sodium diet Reviewed Heart Failure Action Plan.  Reviewed role of diuretics in prevention of fluid overload and management of heart failure  Patient Goals/Self-Care Activities Continue to weigh daily and record.  Call office if you gain more than 3 pounds in one day or 5 pounds in one week  Continue to do ankle pumps when sitting Continue  to keep legs up while sitting Continue to follow a low salt diet and adhere to fluid restriction recommendations.   Continue to watch for swelling in feet, ankles and legs every day (report to doctor for increase in swelling, weight gain, and/ or shortness of breath). Follow your doctors recommendation regarding increase swelling and / or weight gain.  Call your doctor for non emergent symptoms. Evaluate your symptoms based on your Heart failure action plan. If you are in the yellow zone call your doctor to report symptoms.   Call 911 for severe symptoms Wear oxygen as advised by provider and check oxygen saturations daily Continue to take your medications as prescribed. Refill medication timely.  Follow Up Plan: The patient has been provided with contact information for the care management team and has been advised to call with any health related questions or concerns.  The care management team will reach out to the patient again over the next 2-3 months.           Care Plan : Diabetes Type 2 (Adult)  Updates made by Otho Ket, RN since 06/21/2021 12:00 AM     Problem: Glycemic Management (Diabetes, Type 2)   Priority: Medium     Long-Range Goal: Patient will verbalize home management strategies for diabetes   Start Date: 11/12/2020  Expected End Date: 09/27/2021  This Visit's Progress: On track  Recent Progress: On track  Priority: Medium  Note:   Objective:  Lab Results  Component Value Date   HGBA1C 7.9 (H) 02/01/2021   Lab Results  Component Value Date   CREATININE 0.62 02/01/2021   CREATININE 0.70 11/23/2020   CREATININE 0.83 06/15/2020  Current Barriers:  Knowledge Deficits related to basic Diabetes self care/management:  Patient states her blood sugars continue to range in the 200's.  She reports having a recent birthday and states she has not been "eating the best."  Patient states she is on metformin 500 mg daily.  Patient states she is concerned about her blood  sugars continuing in the 200 range.  RNCM advised patient to send MyChart message to her provider reporting recent blood sugar trends for recommendation.  Patient verbalized agreement.         Case Manager Clinical Goal(s):  patient will demonstrate improved adherence to prescribed treatment plan for diabetes self care/management. Interventions:  Collaboration with [name], MD regarding development and update of comprehensive plan of care as evidenced by provider attestation and co-signature Inter-disciplinary care  team collaboration (see longitudinal plan of care) Reviewed medications with and discussed importance of medication adherence.   Discussed plans with patient for ongoing care management follow up and provided patient with direct contact information for care management team Patient will monitor and record blood sugars at least 2-3 times per week.     Patient will continue to adhere to a ADA/carb modified diet. Discussed importance of adhering to low carb/ diabetic diet.   Discussed Hyper/ Hypoglycemia symptoms/ treatment Patient Goals/Self-Care Activities - Continue to check blood sugar at least 2-3 times per week.  Always recheck blood sugar after an elevated  or low reading. Review article sent to you in my chart on Hyperglycemia/ Hypoglycemia blood sugar management. ( You are doing well checking your blood sugars consistently) - Always check blood sugar if you feel it is too high or too low:  Notify your doctor for consistent elevated readings or for readings less than 70 or greater than 200's consistently.  - Take the blood sugar meter  and/ or log to your doctor visits  - Continue to take medication as prescribed and refill timely.  - Attend scheduled provider appointments - Continue to follow an ADA/carb modified (review article sent to you in your My Chart) Follow Up Plan: The patient has been provided with contact information for the care management team and has been  advised to call with any health related questions or concerns.  The care management team will reach out to the patient again over the next 2-3 months.           Plan:The patient has been provided with contact information for the care management team and has been advised to call with any health related questions or concerns.  and The care management team will reach out to the patient again over the next 2-3 months. George Ina RN,BSN,CCM RN Case Manager [facility] K[facility]  [phone]

## 2021-06-28 DIAGNOSIS — E1142 Type 2 diabetes mellitus with diabetic polyneuropathy: Secondary | ICD-10-CM

## 2021-06-28 DIAGNOSIS — I5020 Unspecified systolic (congestive) heart failure: Secondary | ICD-10-CM | POA: Diagnosis not present

## 2021-08-04 ENCOUNTER — Other Ambulatory Visit: Payer: Self-pay | Admitting: Family Medicine

## 2021-08-05 ENCOUNTER — Ambulatory Visit (INDEPENDENT_AMBULATORY_CARE_PROVIDER_SITE_OTHER): Payer: Medicare Other

## 2021-08-05 DIAGNOSIS — Z9981 Dependence on supplemental oxygen: Secondary | ICD-10-CM

## 2021-08-05 DIAGNOSIS — J9611 Chronic respiratory failure with hypoxia: Secondary | ICD-10-CM

## 2021-08-05 DIAGNOSIS — E1142 Type 2 diabetes mellitus with diabetic polyneuropathy: Secondary | ICD-10-CM

## 2021-08-05 NOTE — Chronic Care Management (AMB) (Signed)
Chronic Care Management   CCM RN Visit Note  08/05/2021 Name: Amy Barnes MRN: [id number] DOB: 03/08/1951  Subjective: Amy Barnes is a 70 y.o. year old female who is a primary care patient of [name], MD. The care management team was consulted for assistance with disease management and care coordination needs.    Engaged with patient by telephone for follow up visit in response to provider referral for case management and/or care coordination services.   Consent to Services:  The patient was given information about Chronic Care Management services, agreed to services, and gave verbal consent prior to initiation of services.  Please see initial visit note for detailed documentation.   Patient agreed to services and verbal consent obtained.   Assessment: Review of patient past medical history, allergies, medications, health status, including review of consultants reports, laboratory and other test data, was performed as part of comprehensive evaluation and provision of chronic care management services.   SDOH (Social Determinants of Health) assessments and interventions performed:    CCM Care Plan  Allergies  Allergen Reactions   Toviaz [Fesoterodine] Nausea And Vomiting    Severe reaction - s/p several ER visits then hospitalization ?stress induced cardiomyopathy   Cephalexin Hives   Hydrocodone Other (See Comments)    Reaction:  Hallucinations    Entresto [Sacubitril-Valsartan]    Macrodantin [Nitrofurantoin]     Dyspnea, lips peeling   Tolterodine Tartrate Nausea And Vomiting   Risperidone And Related Other (See Comments)    Reaction:  Made pt excessively sleepy   Seroquel [Quetiapine Fumarate] Other (See Comments)    Reaction:  Made pt excessively sleepy   Sulfa Antibiotics Rash    Tolerated bactrim course well 06/2020    Outpatient Encounter Medications as of 08/05/2021  Medication Sig Note   albuterol (PROVENTIL HFA;VENTOLIN HFA) 108 (90 Base) MCG/ACT  inhaler Inhale 2 puffs into the lungs every 6 (six) hours as needed for wheezing or shortness of breath.    albuterol (PROVENTIL) (2.5 MG/3ML) 0.083% nebulizer solution Take 3 mLs (2.5 mg total) by nebulization every 6 (six) hours as needed for wheezing or shortness of breath.    albuterol (PROVENTIL) (5 MG/ML) 0.5% nebulizer solution Take 0.5 mLs (2.5 mg total) by nebulization every 4 (four) hours as needed for wheezing or shortness of breath.    aspirin 81 MG EC tablet Take 81 mg by mouth at bedtime.    budesonide-formoterol (SYMBICORT) 160-4.5 MCG/ACT inhaler Inhale 2 puffs into the lungs 2 (two) times daily. (Patient taking differently: Inhale 2 puffs into the lungs 2 (two) times daily. Pt uses as needed.) 02/18/2021: 2 puffs 1 time per day   carvedilol (COREG) 6.25 MG tablet TAKE ONE TABLET BY MOUTH TWICE A DAY    Cholecalciferol (VITAMIN D) 50 MCG (2000 UT) CAPS Take 1 capsule (2,000 Units total) by mouth daily.    dicyclomine (BENTYL) 20 MG tablet Take 1 tablet (20 mg total) by mouth in the morning and at bedtime. (Patient taking differently: Take 20 mg by mouth in the morning and at bedtime. As needed)    doxycycline (VIBRA-TABS) 100 MG tablet TAKE ONE TABLET BY MOUTH TWICE A DAY    ferrous sulfate 325 (65 FE) MG EC tablet Take 1 tablet (325 mg total) by mouth every Monday, Wednesday, and Friday. 02/18/2021: Daughter states patient has not started yet. Daughter will pick up medicine for patient.    fluconazole (DIFLUCAN) 200 MG tablet Take 200 mg by mouth daily. Daughter states patient  is taking 1 in am 02/18/2021: As needed    FLUoxetine (PROZAC) 40 MG capsule Take 40 mg by mouth daily.     furosemide (LASIX) 20 MG tablet Take 2 tablets (40 mg total) by mouth daily. And as directed    gabapentin (NEURONTIN) 100 MG capsule TAKE ONE TO TWO CAPSULES BY MOUTH AT BEDTIME    lamoTRIgine (LAMICTAL) 200 MG tablet Take 400 mg by mouth daily.    levothyroxine (SYNTHROID) 150 MCG tablet TAKE ONE TABLET BY  MOUTH AT BEDTIME    metFORMIN (GLUCOPHAGE) 500 MG tablet Take 1 tablet (500 mg total) by mouth daily with breakfast. (Patient taking differently: Take 500 mg by mouth daily with breakfast. Pt takes at bedtime)    montelukast (SINGULAIR) 10 MG tablet TAKE ONE TABLET BY MOUTH EACH NIGHT AT BEDTIME    Multiple Vitamin (MULTIVITAMIN WITH MINERALS) TABS tablet Take 2 tablets by mouth daily.     ondansetron (ZOFRAN ODT) 8 MG disintegrating tablet Take 1 tablet (8 mg total) by mouth 2 (two) times daily as needed for nausea or vomiting.    Oxcarbazepine (TRILEPTAL) 300 MG tablet Take 300 mg by mouth 2 (two) times daily. Pt takes 1 tablet at bedtime (Patient not taking: Reported on 02/18/2021)    pantoprazole (PROTONIX) 40 MG tablet TAKE ONE TABLET BY MOUTH TWICE A DAY    polyethylene glycol (MIRALAX / GLYCOLAX) 17 g packet Take 17 g by mouth 2 (two) times daily as needed for mild constipation.    potassium chloride (KLOR-CON) 10 MEQ tablet Take 1 tablet (10 mEq total) by mouth daily. With lasix. May take an additional 10 meq when taking 60 meq of lasix    prochlorperazine (COMPAZINE) 10 MG tablet Take 1 tablet (10 mg total) by mouth every 6 (six) hours as needed for nausea or vomiting.    promethazine (PHENERGAN) 25 MG suppository Place 1 suppository (25 mg total) rectally every 6 (six) hours as needed for nausea or vomiting.    simvastatin (ZOCOR) 40 MG tablet TAKE ONE TABLET BY MOUTH EVERY EVENING    sucralfate (CARAFATE) 1 GM/10ML suspension Take 10 mLs (1 g total) by mouth 4 (four) times daily -  with meals and at bedtime. (Patient taking differently: Take 1 g by mouth 4 (four) times daily -  with meals and at bedtime. Pt takes as needed.)    No facility-administered encounter medications on file as of 08/05/2021.    Patient Active Problem List   Diagnosis Date Noted   Medicare annual wellness visit, subsequent 02/09/2021   DNR (do not resuscitate) 05/31/2020   Chronic respiratory failure with  hypoxia, on home O2 therapy (HCC) 05/04/2020   Obesity hypoventilation syndrome (HCC) 05/04/2020   LVH (left ventricular hypertrophy) 05/04/2020   Pneumonia due to COVID-19 virus 05/03/2020   Non-ischemic cardiomyopathy (HCC) 12/29/2019   HFrEF (heart failure with reduced ejection fraction) (HCC) 11/25/2019   UTI (urinary tract infection) 11/25/2019   NSTEMI (non-ST elevated myocardial infarction) (HCC) 11/09/2019   QT prolongation 11/09/2019   Hyponatremia 11/03/2019   Diffuse abdominal pain 11/03/2019   Nausea and vomiting 11/03/2019   Diminished pulses in lower extremity 08/11/2019   Osteopenia 08/11/2019   Wound infection complicating hardware (HCC) 04/01/2019   Lumbar adjacent segment disease with spondylolisthesis 03/23/2019   Neurogenic urinary incontinence 11/29/2018   Urinary incontinence 11/29/2018   Bladder spasms 11/29/2018   Discogenic low back pain 11/29/2018   Chronic sacroiliac joint pain (Bilateral) (R>L) 11/29/2018   Spondylosis without myelopathy or  radiculopathy, lumbar region 11/09/2018   Chronic low back pain (Bilateral) (L>R) w/o sciatica 11/09/2018   History of hip replacement (Left) 10/13/2018   History of back surgery 10/13/2018   Vitamin D insufficiency 10/13/2018   Osteoarthritis of facet joint of lumbar spine 10/13/2018   Osteoarthritis involving multiple joints 10/13/2018   Cervical radiculopathy (Left) 10/13/2018   Lumbar facet arthropathy (Bilateral) 10/13/2018   Lumbar facet syndrome (Bilateral) (L>R) 10/13/2018   Abnormal MRI, lumbar spine (09/16/2018) 10/13/2018   Lumbar nerve root compression (L4) (Left) 10/13/2018   Lumbar foraminal stenosis 10/13/2018   Chronic hip pain after total replacement x 3 (Left) 10/13/2018   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   Chronic low back pain (Primary Area of Pain) (Bilateral) (L>R) w/ sciatica (Left) 09/14/2018   Chronic lower extremity pain (Secondary Area of Pain) (Left) 09/14/2018   Chronic hip pain  (Tertiary Area of Pain) (Bilateral) (L>R) 09/14/2018   Chronic knee pain (Fourth Area of Pain) (Bilateral) (R>L) 09/14/2018   Polypharmacy 09/14/2018   Disorder of skeletal system 09/14/2018   Problems influencing health status 09/14/2018   Primary osteoarthritis of first carpometacarpal joint of left hand 04/20/2018   PAH (pulmonary artery hypertension) (HCC) 08/03/2017   Advanced care planning/counseling discussion 06/25/2017   Synovial cyst of lumbar facet joint 11/13/2016   CAD (coronary artery disease) 07/30/2016   Centrilobular emphysema (HCC) 07/30/2016   Ex-smoker 07/08/2016   Nonspecific abnormal electrocardiogram (ECG) (EKG) 07/08/2016   Chronic lumbar radicular pain (L4) (Bilateral) 06/09/2016   DOE (dyspnea on exertion) 05/22/2016   Degenerative joint disease (DJD) of hip 04/17/2016   Somatic dysfunction of sacroiliac joint 04/17/2016   S/P Lumbar Fusion (L3-4 PLIF) 04/17/2016   Cervical fusion syndrome 04/17/2016   Pedal edema 02/19/2016   Chronic pain syndrome 02/19/2016   Aortic atherosclerosis (HCC) 12/11/2015   MRSA (methicillin resistant Staphylococcus aureus) infection 10/11/2012   Infection or inflammatory reaction due to internal joint prosthesis (HCC) 12/03/2011   Morbid obesity with BMI of 60.0-69.9, adult (HCC) 05/13/2011   Essential hypertension 05/17/2010   Cervical radiculopathy (Right) 12/25/2009   Benign positional vertigo 07/21/2008   Hyperlipidemia associated with type 2 diabetes mellitus (HCC) 08/16/2007   Hypothyroidism 07/30/2007   DM type 2 with diabetic peripheral neuropathy (HCC) 07/30/2007   Normocytic anemia 07/30/2007   Bipolar disorder (HCC) 07/30/2007   Allergic rhinitis 07/30/2007   Asthma 07/30/2007   GERD 07/30/2007   Osteoarthritis 07/30/2007   OSA treated with BiPAP 07/30/2007    Conditions to be addressed/monitored:CHF and DMII  Care Plan : Heart Failure (Adult)  Updates made by Otho Ket, RN since 08/05/2021 12:00 AM      Problem: Disease Progression (Heart Failure)   Priority: Medium     Long-Range Goal: patient will verbalize heart failure home managment strategies   Start Date: 11/12/2020  Expected End Date: 11/26/2021  This Visit's Progress: On track  Recent Progress: On track  Priority: Medium  Note:   Current Barriers:  Patient reports being in the [place] zone today. She denies any increase shortness of breath or swelling in ankles/ feet/ legs. Patient states she is not consistently checking her weights.  RNCM discussed and advised of importance of checking weights daily.  She reports recent weight in past week was 375 lb. Patient states she continue to use her inhaler at least once in the evening and uses her oxygen around the clock.    No follow up noted with cardiology.  Advised patient to contact cardiology office regarding  return follow up visit.  Knowledge deficit related to basic heart failure pathophysiology and self care management  Cognitive Deficits: Patient reports having difficulty with focus/ concentration    Case Manager Clinical Goal(s):  patient will verbalize understanding of Heart Failure Action Plan and when to call doctor patient will take all Heart Failure medications as prescribed patient will weigh daily and record (notifying MD of 3 lb weight gain over night or 5 lb in a week) Patient will wear oxygen as advised by provider and check oxygen saturations daily.    Interventions:  Collaboration with [name], MD regarding development and update of comprehensive plan of care as evidenced by provider attestation and co-signature Inter-disciplinary care team collaboration (see longitudinal plan of care) Provided encouragement to continue a low sodium diet Reviewed Heart Failure Action Plan.  Reviewed and discussed medications Patient Goals/Self-Care Activities Continue to weigh daily and record.  Call office if you gain more than 3 pounds in one day or 5 pounds in one week   Continue to do ankle pumps when sitting Continue to keep legs up while sitting Continue to follow a low salt diet and adhere to fluid restriction recommendations.   Continue to watch for swelling in feet, ankles and legs every day (report to doctor for increase in swelling, weight gain, and/ or shortness of breath). Follow your doctors recommendation regarding increase swelling and / or weight gain.  Call your doctor for non emergent symptoms. Evaluate your symptoms based on your Heart failure action plan. If you are in the yellow zone call your doctor to report symptoms.   Call 911 for severe symptoms Wear oxygen as advised by provider and check oxygen saturations daily Continue to take your medications as prescribed. Refill medication timely.  Call cardiology office to scheduled follow up visit.  Follow Up Plan: The patient has been provided with contact information for the care management team and has been advised to call with any health related questions or concerns.  The care management team will reach out to the patient again over the next 2-3 months.           Care Plan : Diabetes Type 2 (Adult)  Updates made by Otho Ket, RN since 08/05/2021 12:00 AM     Problem: Glycemic Management (Diabetes, Type 2)   Priority: Medium     Long-Range Goal: Patient will verbalize home management strategies for diabetes   Start Date: 11/12/2020  Expected End Date: 11/26/2021  This Visit's Progress: On track  Recent Progress: On track  Priority: Medium  Note:   Objective:  Lab Results  Component Value Date   HGBA1C 7.9 (H) 02/01/2021   Lab Results  Component Value Date   CREATININE 0.62 02/01/2021   CREATININE 0.70 11/23/2020   CREATININE 0.83 06/15/2020  Current Barriers:  Knowledge Deficits related to basic Diabetes self care/management:  Patient states she continue to check her blood sugar at least 2-3 times per week. She reports blood sugars ranging from 180-200 fasting or in  the evening.  She states her blood sugars range higher in the morning.  Patient reports eating around 7:00 pm.  Last seen by primary care provider in May 2022 for annual wellness visit.  Patient states she usually sees her primary care provider every 3 months or so for a follow up.  RNCM advised patient to call primary care provider office to scheduled follow up visit.   Case Manager Clinical Goal(s):  patient will demonstrate improved adherence to prescribed  treatment plan for diabetes self care/management. Interventions:  Collaboration with [name], MD regarding development and update of comprehensive plan of care as evidenced by provider attestation and co-signature Inter-disciplinary care team collaboration (see longitudinal plan of care) Reviewed medications with and discussed importance of medication adherence.   Discussed plans with patient for ongoing care management follow up and provided patient with direct contact information for care management team Patient will monitor and record blood sugars at least 2-3 times per week.     Patient will continue to adhere to a ADA/carb modified diet. Discussed importance of adhering to low carb/ diabetic diet.   Discussed Hyper/ Hypoglycemia symptoms/ treatment Patient Goals/Self-Care Activities - Continue to check blood sugar at least 2-3 times per week.  Always recheck blood sugar after an elevated  or low reading. Review article sent to you in my chart on Hyperglycemia/ Hypoglycemia blood sugar management. ( You are doing well checking your blood sugars consistently) - Always check blood sugar if you feel it is too high or too low:  Notify your doctor for consistent elevated readings or for readings less than 70 or greater than 200's consistently.  - Take the blood sugar meter  and/ or log to your doctor visits  - Continue to take medication as prescribed and refill timely.  - Attend scheduled provider appointments (Call primary care office  to schedule a follow up visit before end of the year) - Continue to follow an ADA/carb modified (review article sent to you in your My Chart) Follow Up Plan: The patient has been provided with contact information for the care management team and has been advised to call with any health related questions or concerns.  The care management team will reach out to the patient again over the next 2-3 months.           George Ina RN,BSN,CCM RN Case Manager [facility]  [phone]

## 2021-08-05 NOTE — Patient Instructions (Signed)
Visit Information:  Thank you for taking the time to speak with me today.   Heart failure goals: Continue to weigh daily and record.  Call office if you gain more than 3 pounds in one day or 5 pounds in one week  Continue to do ankle pumps when sitting Continue to keep legs up while sitting Continue to follow a low salt diet and adhere to fluid restriction recommendations.   Continue to watch for swelling in feet, ankles and legs every day (report to doctor for increase in swelling, weight gain, and/ or shortness of breath). Follow your doctors recommendation regarding increase swelling and / or weight gain.  Call your doctor for non emergent symptoms. Evaluate your symptoms based on your Heart failure action plan. If you are in the yellow zone call your doctor to report symptoms.   Call 911 for severe symptoms Wear oxygen as advised by provider and check oxygen saturations daily Continue to take your medications as prescribed. Refill medication timely.  Call cardiology office to scheduled follow up visit.   Diabetes Goals:   Continue to check blood sugar at least 2-3 times per week.  Always recheck blood sugar after an elevated  or low reading. Review article sent to you in my chart on Hyperglycemia/ Hypoglycemia blood sugar management. ( You are doing well checking your blood sugars consistently)  Always check blood sugar if you feel it is too high or too low:  Notify your doctor for consistent elevated readings or for readings less than 70 or greater than 200's consistently.   Take the blood sugar meter  and/ or log to your doctor visits   Continue to take medication as prescribed and refill timely.   Attend scheduled provider appointments (Call primary care office to schedule a follow up visit before end of the year) Continue to follow an ADA/carb modified (review article sent to you in your My Chart)  Patient verbalizes understanding of instructions provided today and agrees to view in  MyChart.   The patient has been provided with contact information for the care management team and has been advised to call with any health related questions or concerns.  The care management team will reach out to the patient on October 28, 2020 at 10:00 am.     George Ina RN,BSN,CCM RN Case Manager [facility] [place]  [phone]

## 2021-08-20 DIAGNOSIS — F3181 Bipolar II disorder: Secondary | ICD-10-CM | POA: Diagnosis not present

## 2021-08-24 ENCOUNTER — Other Ambulatory Visit: Payer: Self-pay | Admitting: Family Medicine

## 2021-08-27 NOTE — Telephone Encounter (Signed)
Refill request Simvastatin Last refill 08/26/20 #90/3 Last office visit 02/08/21 No upcoming appointment scheduled See allergy/contraindication

## 2021-09-06 ENCOUNTER — Telehealth: Payer: Medicare Other

## 2021-09-12 ENCOUNTER — Telehealth: Payer: Medicare Other

## 2021-10-11 DIAGNOSIS — M4316 Spondylolisthesis, lumbar region: Secondary | ICD-10-CM | POA: Diagnosis not present

## 2021-10-11 DIAGNOSIS — Z981 Arthrodesis status: Secondary | ICD-10-CM | POA: Diagnosis not present

## 2021-10-11 DIAGNOSIS — I10 Essential (primary) hypertension: Secondary | ICD-10-CM | POA: Diagnosis not present

## 2021-10-22 DIAGNOSIS — F3181 Bipolar II disorder: Secondary | ICD-10-CM | POA: Diagnosis not present

## 2021-10-28 ENCOUNTER — Telehealth: Payer: Medicare Other

## 2021-11-12 ENCOUNTER — Ambulatory Visit (INDEPENDENT_AMBULATORY_CARE_PROVIDER_SITE_OTHER): Payer: Medicare Other

## 2021-11-12 DIAGNOSIS — I5020 Unspecified systolic (congestive) heart failure: Secondary | ICD-10-CM

## 2021-11-12 DIAGNOSIS — E1142 Type 2 diabetes mellitus with diabetic polyneuropathy: Secondary | ICD-10-CM

## 2021-11-12 NOTE — Chronic Care Management (AMB) (Signed)
Chronic Care Management   CCM RN Visit Note  11/12/2021 Name: Amy Barnes MRN: [id number] DOB: 1951-08-25  Subjective: Amy Barnes is a 71 y.o. year old female who is a primary care patient of Amy Boyden, MD. The care management team was consulted for assistance with disease management and care coordination needs.    Engaged with patient by telephone for follow up visit in response to provider referral for case management and/or care coordination services.   Consent to Services:  The patient was given information about Chronic Care Management services, agreed to services, and gave verbal consent prior to initiation of services.  Please see initial visit note for detailed documentation.   Patient agreed to services and verbal consent obtained.   Assessment: Review of patient past medical history, allergies, medications, health status, including review of consultants reports, laboratory and other test data, was performed as part of comprehensive evaluation and provision of chronic care management services.   SDOH (Social Determinants of Health) assessments and interventions performed:    CCM Care Plan  Allergies  Allergen Reactions   Toviaz [Fesoterodine] Nausea And Vomiting    Severe reaction - s/p several ER visits then hospitalization ?stress induced cardiomyopathy   Cephalexin Hives   Hydrocodone Other (See Comments)    Reaction:  Hallucinations    Entresto [Sacubitril-Valsartan]    Macrodantin [Nitrofurantoin]     Dyspnea, lips peeling   Tolterodine Tartrate Nausea And Vomiting   Risperidone And Related Other (See Comments)    Reaction:  Made pt excessively sleepy   Seroquel [Quetiapine Fumarate] Other (See Comments)    Reaction:  Made pt excessively sleepy   Sulfa Antibiotics Rash    Tolerated bactrim course well 06/2020    Outpatient Encounter Medications as of 11/12/2021  Medication Sig Note   albuterol (PROVENTIL HFA;VENTOLIN HFA) 108 (90 Base) MCG/ACT  inhaler Inhale 2 puffs into the lungs every 6 (six) hours as needed for wheezing or shortness of breath.    albuterol (PROVENTIL) (2.5 MG/3ML) 0.083% nebulizer solution Take 3 mLs (2.5 mg total) by nebulization every 6 (six) hours as needed for wheezing or shortness of breath.    albuterol (PROVENTIL) (5 MG/ML) 0.5% nebulizer solution Take 0.5 mLs (2.5 mg total) by nebulization every 4 (four) hours as needed for wheezing or shortness of breath.    aspirin 81 MG EC tablet Take 81 mg by mouth at bedtime.    budesonide-formoterol (SYMBICORT) 160-4.5 MCG/ACT inhaler Inhale 2 puffs into the lungs 2 (two) times daily. (Patient taking differently: Inhale 2 puffs into the lungs 2 (two) times daily. Pt uses as needed.) 02/18/2021: 2 puffs 1 time per day   carvedilol (COREG) 6.25 MG tablet TAKE ONE TABLET BY MOUTH TWICE A DAY    Cholecalciferol (VITAMIN D) 50 MCG (2000 UT) CAPS Take 1 capsule (2,000 Units total) by mouth daily.    dicyclomine (BENTYL) 20 MG tablet Take 1 tablet (20 mg total) by mouth in the morning and at bedtime. (Patient taking differently: Take 20 mg by mouth in the morning and at bedtime. As needed)    doxycycline (VIBRA-TABS) 100 MG tablet TAKE ONE TABLET BY MOUTH TWICE A DAY    ferrous sulfate 325 (65 FE) MG EC tablet Take 1 tablet (325 mg total) by mouth every Monday, Wednesday, and Friday. 02/18/2021: Daughter states patient has not started yet. Daughter will pick up medicine for patient.    fluconazole (DIFLUCAN) 200 MG tablet Take 200 mg by mouth daily. Daughter states patient  is taking 1 in am 02/18/2021: As needed    FLUoxetine (PROZAC) 40 MG capsule Take 40 mg by mouth daily.     furosemide (LASIX) 20 MG tablet Take 2 tablets (40 mg total) by mouth daily. And as directed    gabapentin (NEURONTIN) 100 MG capsule TAKE ONE TO TWO CAPSULES BY MOUTH AT BEDTIME    lamoTRIgine (LAMICTAL) 200 MG tablet Take 400 mg by mouth daily.    levothyroxine (SYNTHROID) 150 MCG tablet TAKE ONE TABLET BY  MOUTH AT BEDTIME    metFORMIN (GLUCOPHAGE) 500 MG tablet Take 1 tablet (500 mg total) by mouth daily with breakfast. (Patient taking differently: Take 500 mg by mouth daily with breakfast. Pt takes at bedtime)    montelukast (SINGULAIR) 10 MG tablet TAKE ONE TABLET BY MOUTH EACH NIGHT AT BEDTIME    Multiple Vitamin (MULTIVITAMIN WITH MINERALS) TABS tablet Take 2 tablets by mouth daily.     ondansetron (ZOFRAN ODT) 8 MG disintegrating tablet Take 1 tablet (8 mg total) by mouth 2 (two) times daily as needed for nausea or vomiting.    Oxcarbazepine (TRILEPTAL) 300 MG tablet Take 300 mg by mouth 2 (two) times daily. Pt takes 1 tablet at bedtime (Patient not taking: Reported on 02/18/2021)    pantoprazole (PROTONIX) 40 MG tablet TAKE ONE TABLET BY MOUTH TWICE A DAY    polyethylene glycol (MIRALAX / GLYCOLAX) 17 g packet Take 17 g by mouth 2 (two) times daily as needed for mild constipation.    potassium chloride (KLOR-CON) 10 MEQ tablet Take 1 tablet (10 mEq total) by mouth daily. With lasix. May take an additional 10 meq when taking 60 meq of lasix    prochlorperazine (COMPAZINE) 10 MG tablet Take 1 tablet (10 mg total) by mouth every 6 (six) hours as needed for nausea or vomiting.    promethazine (PHENERGAN) 25 MG suppository Place 1 suppository (25 mg total) rectally every 6 (six) hours as needed for nausea or vomiting.    simvastatin (ZOCOR) 40 MG tablet TAKE ONE TABLET BY MOUTH EVERY EVENING    sucralfate (CARAFATE) 1 GM/10ML suspension Take 10 mLs (1 g total) by mouth 4 (four) times daily -  with meals and at bedtime. (Patient taking differently: Take 1 g by mouth 4 (four) times daily -  with meals and at bedtime. Pt takes as needed.)    No facility-administered encounter medications on file as of 11/12/2021.    Patient Active Problem List   Diagnosis Date Noted   Medicare annual wellness visit, subsequent 02/09/2021   DNR (do not resuscitate) 05/31/2020   Chronic respiratory failure with  hypoxia, on home O2 therapy (HCC) 05/04/2020   Obesity hypoventilation syndrome (HCC) 05/04/2020   LVH (left ventricular hypertrophy) 05/04/2020   Pneumonia due to COVID-19 virus 05/03/2020   Non-ischemic cardiomyopathy (HCC) 12/29/2019   HFrEF (heart failure with reduced ejection fraction) (HCC) 11/25/2019   UTI (urinary tract infection) 11/25/2019   NSTEMI (non-ST elevated myocardial infarction) (HCC) 11/09/2019   QT prolongation 11/09/2019   Hyponatremia 11/03/2019   Diffuse abdominal pain 11/03/2019   Nausea and vomiting 11/03/2019   Diminished pulses in lower extremity 08/11/2019   Osteopenia 08/11/2019   Wound infection complicating hardware (HCC) 04/01/2019   Lumbar adjacent segment disease with spondylolisthesis 03/23/2019   Neurogenic urinary incontinence 11/29/2018   Urinary incontinence 11/29/2018   Bladder spasms 11/29/2018   Discogenic low back pain 11/29/2018   Chronic sacroiliac joint pain (Bilateral) (R>L) 11/29/2018   Spondylosis without myelopathy or  radiculopathy, lumbar region 11/09/2018   Chronic low back pain (Bilateral) (L>R) w/o sciatica 11/09/2018   History of hip replacement (Left) 10/13/2018   History of back surgery 10/13/2018   Vitamin D insufficiency 10/13/2018   Osteoarthritis of facet joint of lumbar spine 10/13/2018   Osteoarthritis involving multiple joints 10/13/2018   Cervical radiculopathy (Left) 10/13/2018   Lumbar facet arthropathy (Bilateral) 10/13/2018   Lumbar facet syndrome (Bilateral) (L>R) 10/13/2018   Abnormal MRI, lumbar spine (09/16/2018) 10/13/2018   Lumbar nerve root compression (L4) (Left) 10/13/2018   Lumbar foraminal stenosis 10/13/2018   Chronic hip pain after total replacement x 3 (Left) 10/13/2018   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   Chronic low back pain (Primary Area of Pain) (Bilateral) (L>R) w/ sciatica (Left) 09/14/2018   Chronic lower extremity pain (Secondary Area of Pain) (Left) 09/14/2018   Chronic hip pain  (Tertiary Area of Pain) (Bilateral) (L>R) 09/14/2018   Chronic knee pain (Fourth Area of Pain) (Bilateral) (R>L) 09/14/2018   Polypharmacy 09/14/2018   Disorder of skeletal system 09/14/2018   Problems influencing health status 09/14/2018   Primary osteoarthritis of first carpometacarpal joint of left hand 04/20/2018   PAH (pulmonary artery hypertension) (HCC) 08/03/2017   Advanced care planning/counseling discussion 06/25/2017   Synovial cyst of lumbar facet joint 11/13/2016   CAD (coronary artery disease) 07/30/2016   Centrilobular emphysema (HCC) 07/30/2016   Ex-smoker 07/08/2016   Nonspecific abnormal electrocardiogram (ECG) (EKG) 07/08/2016   Chronic lumbar radicular pain (L4) (Bilateral) 06/09/2016   DOE (dyspnea on exertion) 05/22/2016   Degenerative joint disease (DJD) of hip 04/17/2016   Somatic dysfunction of sacroiliac joint 04/17/2016   S/P Lumbar Fusion (L3-4 PLIF) 04/17/2016   Cervical fusion syndrome 04/17/2016   Pedal edema 02/19/2016   Chronic pain syndrome 02/19/2016   Aortic atherosclerosis (HCC) 12/11/2015   MRSA (methicillin resistant Staphylococcus aureus) infection 10/11/2012   Infection or inflammatory reaction due to internal joint prosthesis (HCC) 12/03/2011   Morbid obesity with BMI of 60.0-69.9, adult (HCC) 05/13/2011   Essential hypertension 05/17/2010   Cervical radiculopathy (Right) 12/25/2009   Benign positional vertigo 07/21/2008   Hyperlipidemia associated with type 2 diabetes mellitus (HCC) 08/16/2007   Hypothyroidism 07/30/2007   DM type 2 with diabetic peripheral neuropathy (HCC) 07/30/2007   Normocytic anemia 07/30/2007   Bipolar disorder (HCC) 07/30/2007   Allergic rhinitis 07/30/2007   Asthma 07/30/2007   GERD 07/30/2007   Osteoarthritis 07/30/2007   OSA treated with BiPAP 07/30/2007    Conditions to be addressed/monitored:CHF and DMII   Care Plan : [facility] plan of care  Updates made by Otho Ket, RN since 11/12/2021 12:00 AM      Problem: Chronic disease management education and care coordination needs.   Priority: High     Long-Range Goal: development of plan of care to address chronic disease management and care coordination needs.   Start Date: 11/12/2021  Expected End Date: 01/24/2022  Priority: High  Note:   Current Barriers:  Knowledge Deficits related to plan of care for management of CHF and DMII  Care Coordination needs related to symptoms  Patient states she is not feeling to bad.  She states she has good days and bad.  Patient denies any increase symptoms related to heart failure. Reports being in the [place] zone today. She states she continues to use her oxygen at 2L as prescribed. Patient unable to complete review of medications. She states her daughter manages her medications and request RNCM follow up with daughter to  review. Patient reports today's weight is 363 lbs.  Patient reports her blood sugars range from 345 to 405 fasting. She states she takes only metformin for blood sugar management.  Patient reports checking blood sugars 1-2 times per day.  She states she and her family have had a few parties over the last few weeks so she has not ate the best.  Patient states she is experiencing burning and pain with urination. She states she has not reported this to her doctor.  RNCM advised patient to call provider office or send Mychart message to report urinary symptoms and elevated blood sugars to her doctor.  Patient states she does not want to go to the temporary provider office due to distance.  RNCM advised patient to request virtual visit if necessary. Patient verbalized understanding.   RNCM Clinical Goal(s):  Patient will verbalize understanding of plan for management of CHF and DMII as evidenced by patient self report and/ or notation in chart. take all medications exactly as prescribed and will call provider for medication related questions as evidenced by patient self report and/ or notation in  chart.     attend all scheduled medical appointments:   as evidenced by patient self report and/ or notation in chart.         continue to work with Medical illustrator and/or Social Worker to address care management and care coordination needs related to CHF and DMII as evidenced by adherence to CM Team Scheduled appointments     through collaboration with Medical illustrator, provider, and care team.   Interventions: 1:1 collaboration with primary care provider regarding development and update of comprehensive plan of care as evidenced by provider attestation and co-signature Inter-disciplinary care team collaboration (see longitudinal plan of care) Evaluation of current treatment plan related to  self management and patient's adherence to plan as established by provider   Heart Failure Interventions:  (Status:  Goal on track:  Yes.) Long Term Goal Discussed importance of daily weight and advised patient to weigh and record daily Discussed the importance of keeping all appointments with provider Reviewed heart failure action plan and zones Reviewed medications with patient and discussed importance of adherence.   Diabetes Interventions:  (Status:  Goal on track:  Yes.) Long Term Goal Assessed patient's understanding of A1c goal:  7.9 Reviewed medications with patient and discussed importance of medication adherence Discussed plans with patient for ongoing care management follow up and provided patient with direct contact information for care management team Reviewed scheduled/upcoming provider appointments Advised patient to sent her primary care provider a Mychart message informing him of elevated fasting blood sugars ranging from 345 - 405. Request virtual visit if unable to get to office.  Lab Results  Component Value Date   HGBA1C 7.9 (H) 02/01/2021     Urinary symptom intervention:   (Status:  New goal.)  Short Term Goal Evaluation of current treatment plan related to  Urinary tract  symptoms ,  self-management and patient's adherence to plan as established by provider. Discussed plans with patient for ongoing care management follow up and provided patient with direct contact information for care management team Advised patient to call or send Mychart message reporting possible urinary tract symptoms  Patient Goals/Self-Care Activities: Take medications as prescribed   Attend all scheduled provider appointments Call pharmacy for medication refills 3-7 days in advance of running out of medications Call provider office for new concerns or questions  call office if I gain more than 3  pounds in one day or 5 pounds in one week do ankle pumps when sitting and keep legs up while sitting, watch for swelling in feet, ankles and legs every day. Call your doctor for non emergent symptoms. Evaluate your symptoms based on your Heart failure action plan. If you are in the yellow zone call your doctor to report symptoms.   Call 911 for severe symptoms  Continue to weigh daily and record.   Continue to follow a low salt diet and adhere to fluid restriction recommendations.   Wear oxygen as advised by provider and check oxygen saturations daily Continue to check blood sugar at least 2-3 times per day.  Always recheck blood sugar after an elevated  or low reading.  Send primary care provider Mychart message informing him of elevated fasting blood sugars.  Request video visit if unable to get to provider office.   RULE OF 15 How to treat low blood sugars (Blood sugar less than 70 mg/dl  Please follow the RULE OF 15 for the treatment of hypoglycemia treatment (When your blood sugars are less than 70 mg/ dl) STEP  1:  Take 15 grams of carbohydrates when your blood sugar is low, which includes:   3-4 glucose tabs or  3-4 oz of juice or regular soda or  One tube of glucose gel STEP 2:  Recheck blood sugar in 15 minutes STEP 3:  If your blood sugar is still low at the 15 minute recheck ---then,  go back to STEP 1 and treat again with another 15 grams of carbohydrates     Plan:The patient has been provided with contact information for the care management team and has been advised to call with any health related questions or concerns.  The care management team will reach out to the patient again over the next 2 months g George Ina RN,BSN,CCM RN Case Manager [place]  [phone]

## 2021-11-12 NOTE — Patient Instructions (Signed)
Visit Information  Thank you for taking time to visit with me today. Please don't hesitate to contact me if I can be of assistance to you before our next scheduled telephone appointment.  Following are the goals we discussed today:   Take medications as prescribed   Attend all scheduled provider appointments Call pharmacy for medication refills 3-7 days in advance of running out of medications Call provider office for new concerns or questions  call office if I gain more than 3 pounds in one day or 5 pounds in one week do ankle pumps when sitting and keep legs up while sitting, watch for swelling in feet, ankles and legs every day. Call your doctor for non emergent symptoms. Evaluate your symptoms based on your Heart failure action plan. If you are in the yellow zone call your doctor to report symptoms.   Call 911 for severe symptoms  Continue to weigh daily and record.   Continue to follow a low salt diet and adhere to fluid restriction recommendations.   Wear oxygen as advised by provider and check oxygen saturations daily Continue to check blood sugar at least 2-3 times per day.  Always recheck blood sugar after an elevated  or low reading.  Send primary care provider Mychart message informing him of elevated fasting blood sugars.  Request video visit if unable to get to provider office.   RULE OF 15 How to treat low blood sugars (Blood sugar less than 70 mg/dl  Please follow the RULE OF 15 for the treatment of hypoglycemia treatment (When your blood sugars are less than 70 mg/ dl) STEP  1:  Take 15 grams of carbohydrates when your blood sugar is low, which includes:   3-4 glucose tabs or  3-4 oz of juice or regular soda or  One tube of glucose gel STEP 2:  Recheck blood sugar in 15 minutes STEP 3:  If your blood sugar is still low at the 15 minute recheck ---then, go back to STEP 1 and treat again with another 15 grams of carbohydrates Our next appointment is December 13, 2021  at  11:00am  Please call the care guide team at [phone] if you need to cancel or reschedule your appointment.   If you are experiencing a Mental Health or Behavioral Health Crisis or need someone to talk to, please call the Suicide and Crisis Lifeline: 988 call 1-800-273-TALK (toll free, 24 hour hotline)   Patient verbalizes understanding of instructions and care plan provided today and agrees to view in MyChart. Active MyChart status confirmed with patient.    George Ina RN,BSN,CCM RN Case Manager [facility] [place]  [phone]

## 2021-11-14 ENCOUNTER — Ambulatory Visit: Payer: Medicare Other

## 2021-11-14 DIAGNOSIS — E1142 Type 2 diabetes mellitus with diabetic polyneuropathy: Secondary | ICD-10-CM

## 2021-11-14 DIAGNOSIS — I5020 Unspecified systolic (congestive) heart failure: Secondary | ICD-10-CM

## 2021-11-14 NOTE — Patient Instructions (Signed)
Visit Information  Thank you for taking time to visit with me today. Please don't hesitate to contact me if I can be of assistance to you before our next scheduled telephone appointment.  Following are the goals we discussed today:  Recommendation from Dr. Sharen Hones:  Call primary care provider office and schedule follow up visit regarding elevated blood sugars and urinary tract symptoms as soon as possible. Take medications as prescribed   Attend all scheduled provider appointments Call pharmacy for medication refills 3-7 days in advance of running out of medications Call provider office for new concerns or questions  call office if I gain more than 3 pounds in one day or 5 pounds in one week do ankle pumps when sitting and keep legs up while sitting, watch for swelling in feet, ankles and legs every day. Call your doctor for non emergent symptoms. Evaluate your symptoms based on your Heart failure action plan. If you are in the yellow zone call your doctor to report symptoms.   Call 911 for severe symptoms  Continue to weigh daily and record.   Continue to follow a low salt diet and adhere to fluid restriction recommendations.   Wear oxygen as advised by provider and check oxygen saturations daily Continue to check blood sugar at least 2-3 times per day.  Always recheck blood sugar after an elevated  or low reading.  Send primary care provider Mychart message informing him of elevated fasting blood sugars.  Request video visit if unable to get to provider office.   RULE OF 15 How to treat low blood sugars (Blood sugar less than 70 mg/dl  Please follow the RULE OF 15 for the treatment of hypoglycemia treatment (When your blood sugars are less than 70 mg/ dl) STEP  1:  Take 15 grams of carbohydrates when your blood sugar is low, which includes:   3-4 glucose tabs or  3-4 oz of juice or regular soda or  One tube of glucose gel STEP 2:  Recheck blood sugar in 15 minutes STEP 3:  If your blood  sugar is still low at the 15 minute recheck ---then, go back to STEP 1 and treat again with another 15 grams of carbohydrates  Our next appointment is by telephone on 12/13/21 at 11:00 am  Please call the care guide team at [phone] if you need to cancel or reschedule your appointment.   If you are experiencing a Mental Health or Behavioral Health Crisis or need someone to talk to, please call the Suicide and Crisis Lifeline: 988 call 1-800-273-TALK (toll free, 24 hour hotline)   Patient verbalizes understanding of instructions and care plan provided today and agrees to view in MyChart. Active MyChart status confirmed with patient.   George Ina RN,BSN,CCM RN Case Manager [facility] [place]  [phone]

## 2021-11-14 NOTE — Chronic Care Management (AMB) (Signed)
Chronic Care Management   CCM RN Visit Note  11/14/2021 Name: Amy Barnes MRN: [id number] DOB: 1950/11/14  Subjective: Amy Barnes is a 71 y.o. year old female who is a primary care patient of [name], MD. The care management team was consulted for assistance with disease management and care coordination needs.    Engaged with patient by telephone for follow up visit in response to provider referral for case management and/or care coordination services.   Consent to Services:  The patient was given information about Chronic Care Management services, agreed to services, and gave verbal consent prior to initiation of services.  Please see initial visit note for detailed documentation.   Patient agreed to services and verbal consent obtained.   Assessment: Review of patient past medical history, allergies, medications, health status, including review of consultants reports, laboratory and other test data, was performed as part of comprehensive evaluation and provision of chronic care management services.   SDOH (Social Determinants of Health) assessments and interventions performed:    CCM Care Plan  Allergies  Allergen Reactions   Toviaz [Fesoterodine] Nausea And Vomiting    Severe reaction - s/p several ER visits then hospitalization ?stress induced cardiomyopathy   Cephalexin Hives   Hydrocodone Other (See Comments)    Reaction:  Hallucinations    Entresto [Sacubitril-Valsartan]    Macrodantin [Nitrofurantoin]     Dyspnea, lips peeling   Tolterodine Tartrate Nausea And Vomiting   Risperidone And Related Other (See Comments)    Reaction:  Made pt excessively sleepy   Seroquel [Quetiapine Fumarate] Other (See Comments)    Reaction:  Made pt excessively sleepy   Sulfa Antibiotics Rash    Tolerated bactrim course well 06/2020    Outpatient Encounter Medications as of 11/14/2021  Medication Sig Note   albuterol (PROVENTIL HFA;VENTOLIN HFA) 108 (90 Base) MCG/ACT  inhaler Inhale 2 puffs into the lungs every 6 (six) hours as needed for wheezing or shortness of breath.    albuterol (PROVENTIL) (2.5 MG/3ML) 0.083% nebulizer solution Take 3 mLs (2.5 mg total) by nebulization every 6 (six) hours as needed for wheezing or shortness of breath.    albuterol (PROVENTIL) (5 MG/ML) 0.5% nebulizer solution Take 0.5 mLs (2.5 mg total) by nebulization every 4 (four) hours as needed for wheezing or shortness of breath.    aspirin 81 MG EC tablet Take 81 mg by mouth at bedtime.    budesonide-formoterol (SYMBICORT) 160-4.5 MCG/ACT inhaler Inhale 2 puffs into the lungs 2 (two) times daily. (Patient taking differently: Inhale 2 puffs into the lungs 2 (two) times daily. Pt uses as needed.) 02/18/2021: 2 puffs 1 time per day   carvedilol (COREG) 6.25 MG tablet TAKE ONE TABLET BY MOUTH TWICE A DAY    Cholecalciferol (VITAMIN D) 50 MCG (2000 UT) CAPS Take 1 capsule (2,000 Units total) by mouth daily.    dicyclomine (BENTYL) 20 MG tablet Take 1 tablet (20 mg total) by mouth in the morning and at bedtime. (Patient taking differently: Take 20 mg by mouth in the morning and at bedtime. As needed)    doxycycline (VIBRA-TABS) 100 MG tablet TAKE ONE TABLET BY MOUTH TWICE A DAY    ferrous sulfate 325 (65 FE) MG EC tablet Take 1 tablet (325 mg total) by mouth every Monday, Wednesday, and Friday. 02/18/2021: Daughter states patient has not started yet. Daughter will pick up medicine for patient.    fluconazole (DIFLUCAN) 200 MG tablet Take 200 mg by mouth daily. Daughter states patient  is taking 1 in am 02/18/2021: As needed    FLUoxetine (PROZAC) 40 MG capsule Take 40 mg by mouth daily.     furosemide (LASIX) 20 MG tablet Take 2 tablets (40 mg total) by mouth daily. And as directed    gabapentin (NEURONTIN) 100 MG capsule TAKE ONE TO TWO CAPSULES BY MOUTH AT BEDTIME    lamoTRIgine (LAMICTAL) 200 MG tablet Take 400 mg by mouth daily.    levothyroxine (SYNTHROID) 150 MCG tablet TAKE ONE TABLET BY  MOUTH AT BEDTIME    metFORMIN (GLUCOPHAGE) 500 MG tablet Take 1 tablet (500 mg total) by mouth daily with breakfast. (Patient taking differently: Take 500 mg by mouth daily with breakfast. Pt takes at bedtime)    montelukast (SINGULAIR) 10 MG tablet TAKE ONE TABLET BY MOUTH EACH NIGHT AT BEDTIME    Multiple Vitamin (MULTIVITAMIN WITH MINERALS) TABS tablet Take 2 tablets by mouth daily.     ondansetron (ZOFRAN ODT) 8 MG disintegrating tablet Take 1 tablet (8 mg total) by mouth 2 (two) times daily as needed for nausea or vomiting.    Oxcarbazepine (TRILEPTAL) 300 MG tablet Take 300 mg by mouth 2 (two) times daily. Pt takes 1 tablet at bedtime (Patient not taking: Reported on 02/18/2021)    pantoprazole (PROTONIX) 40 MG tablet TAKE ONE TABLET BY MOUTH TWICE A DAY    polyethylene glycol (MIRALAX / GLYCOLAX) 17 g packet Take 17 g by mouth 2 (two) times daily as needed for mild constipation.    potassium chloride (KLOR-CON) 10 MEQ tablet Take 1 tablet (10 mEq total) by mouth daily. With lasix. May take an additional 10 meq when taking 60 meq of lasix    prochlorperazine (COMPAZINE) 10 MG tablet Take 1 tablet (10 mg total) by mouth every 6 (six) hours as needed for nausea or vomiting.    promethazine (PHENERGAN) 25 MG suppository Place 1 suppository (25 mg total) rectally every 6 (six) hours as needed for nausea or vomiting.    simvastatin (ZOCOR) 40 MG tablet TAKE ONE TABLET BY MOUTH EVERY EVENING    sucralfate (CARAFATE) 1 GM/10ML suspension Take 10 mLs (1 g total) by mouth 4 (four) times daily -  with meals and at bedtime. (Patient taking differently: Take 1 g by mouth 4 (four) times daily -  with meals and at bedtime. Pt takes as needed.)    No facility-administered encounter medications on file as of 11/14/2021.    Patient Active Problem List   Diagnosis Date Noted   Medicare annual wellness visit, subsequent 02/09/2021   DNR (do not resuscitate) 05/31/2020   Chronic respiratory failure with  hypoxia, on home O2 therapy (HCC) 05/04/2020   Obesity hypoventilation syndrome (HCC) 05/04/2020   LVH (left ventricular hypertrophy) 05/04/2020   Pneumonia due to COVID-19 virus 05/03/2020   Non-ischemic cardiomyopathy (HCC) 12/29/2019   HFrEF (heart failure with reduced ejection fraction) (HCC) 11/25/2019   UTI (urinary tract infection) 11/25/2019   NSTEMI (non-ST elevated myocardial infarction) (HCC) 11/09/2019   QT prolongation 11/09/2019   Hyponatremia 11/03/2019   Diffuse abdominal pain 11/03/2019   Nausea and vomiting 11/03/2019   Diminished pulses in lower extremity 08/11/2019   Osteopenia 08/11/2019   Wound infection complicating hardware (HCC) 04/01/2019   Lumbar adjacent segment disease with spondylolisthesis 03/23/2019   Neurogenic urinary incontinence 11/29/2018   Urinary incontinence 11/29/2018   Bladder spasms 11/29/2018   Discogenic low back pain 11/29/2018   Chronic sacroiliac joint pain (Bilateral) (R>L) 11/29/2018   Spondylosis without myelopathy or  radiculopathy, lumbar region 11/09/2018   Chronic low back pain (Bilateral) (L>R) w/o sciatica 11/09/2018   History of hip replacement (Left) 10/13/2018   History of back surgery 10/13/2018   Vitamin D insufficiency 10/13/2018   Osteoarthritis of facet joint of lumbar spine 10/13/2018   Osteoarthritis involving multiple joints 10/13/2018   Cervical radiculopathy (Left) 10/13/2018   Lumbar facet arthropathy (Bilateral) 10/13/2018   Lumbar facet syndrome (Bilateral) (L>R) 10/13/2018   Abnormal MRI, lumbar spine (09/16/2018) 10/13/2018   Lumbar nerve root compression (L4) (Left) 10/13/2018   Lumbar foraminal stenosis 10/13/2018   Chronic hip pain after total replacement x 3 (Left) 10/13/2018   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   Chronic low back pain (Primary Area of Pain) (Bilateral) (L>R) w/ sciatica (Left) 09/14/2018   Chronic lower extremity pain (Secondary Area of Pain) (Left) 09/14/2018   Chronic hip pain  (Tertiary Area of Pain) (Bilateral) (L>R) 09/14/2018   Chronic knee pain (Fourth Area of Pain) (Bilateral) (R>L) 09/14/2018   Polypharmacy 09/14/2018   Disorder of skeletal system 09/14/2018   Problems influencing health status 09/14/2018   Primary osteoarthritis of first carpometacarpal joint of left hand 04/20/2018   PAH (pulmonary artery hypertension) (HCC) 08/03/2017   Advanced care planning/counseling discussion 06/25/2017   Synovial cyst of lumbar facet joint 11/13/2016   CAD (coronary artery disease) 07/30/2016   Centrilobular emphysema (HCC) 07/30/2016   Ex-smoker 07/08/2016   Nonspecific abnormal electrocardiogram (ECG) (EKG) 07/08/2016   Chronic lumbar radicular pain (L4) (Bilateral) 06/09/2016   DOE (dyspnea on exertion) 05/22/2016   Degenerative joint disease (DJD) of hip 04/17/2016   Somatic dysfunction of sacroiliac joint 04/17/2016   S/P Lumbar Fusion (L3-4 PLIF) 04/17/2016   Cervical fusion syndrome 04/17/2016   Pedal edema 02/19/2016   Chronic pain syndrome 02/19/2016   Aortic atherosclerosis (HCC) 12/11/2015   MRSA (methicillin resistant Staphylococcus aureus) infection 10/11/2012   Infection or inflammatory reaction due to internal joint prosthesis (HCC) 12/03/2011   Morbid obesity with BMI of 60.0-69.9, adult (HCC) 05/13/2011   Essential hypertension 05/17/2010   Cervical radiculopathy (Right) 12/25/2009   Benign positional vertigo 07/21/2008   Hyperlipidemia associated with type 2 diabetes mellitus (HCC) 08/16/2007   Hypothyroidism 07/30/2007   DM type 2 with diabetic peripheral neuropathy (HCC) 07/30/2007   Normocytic anemia 07/30/2007   Bipolar disorder (HCC) 07/30/2007   Allergic rhinitis 07/30/2007   Asthma 07/30/2007   GERD 07/30/2007   Osteoarthritis 07/30/2007   OSA treated with BiPAP 07/30/2007    Conditions to be addressed/monitored:CHF and DMII  Care Plan : [facility] plan of care  Updates made by Otho Ket, RN since 11/14/2021 12:00 AM      Problem: Chronic disease management education and care coordination needs.   Priority: High     Long-Range Goal: development of plan of care to address chronic disease management and care coordination needs.   Start Date: 11/12/2021  Expected End Date: 01/24/2022  Priority: High  Note:   Current Barriers:  Knowledge Deficits related to plan of care for management of CHF and DMII  Care Coordination needs related to symptoms  Patient states she is not feeling to bad.  She states she has good days and bad.  Patient denies any increase symptoms related to heart failure. Reports being in the [place] zone today. She states she continues to use her oxygen at 2L as prescribed. Patient unable to complete review of medications. She states her daughter manages her medications and request RNCM follow up with daughter to review.  Patient reports today's weight is 363 lbs.  Patient reports her blood sugars range from 345 to 405 fasting. She states she takes only metformin for blood sugar management.  Patient reports checking blood sugars 1-2 times per day.  She states she and her family have had a few parties over the last few weeks so she has not ate the best.  Patient states she is experiencing burning and pain with urination. She states she has not reported this to her doctor.  RNCM advised patient to call provider office or send Mychart message to report urinary symptoms and elevated blood sugars to her doctor.  Patient states she does not want to go to the temporary provider office due to distance.  RNCM advised patient to request virtual visit if necessary. Patient verbalized understanding.  11/14/21 Addendum:  Received in basket message from Dr. Sharen Hones advising to have patient call his office to set up follow up appointment to address concerns.  RNCM called patient and requested she contact provider office to arrange follow up appointment.   Patient verbalized understanding and agreement.   RNCM Clinical  Goal(s):  Patient will verbalize understanding of plan for management of CHF and DMII as evidenced by patient self report and/ or notation in chart. take all medications exactly as prescribed and will call provider for medication related questions as evidenced by patient self report and/ or notation in chart.     attend all scheduled medical appointments:   as evidenced by patient self report and/ or notation in chart.         continue to work with Medical illustrator and/or Social Worker to address care management and care coordination needs related to CHF and DMII as evidenced by adherence to CM Team Scheduled appointments     through collaboration with Medical illustrator, provider, and care team.   Interventions: 1:1 collaboration with primary care provider regarding development and update of comprehensive plan of care as evidenced by provider attestation and co-signature Inter-disciplinary care team collaboration (see longitudinal plan of care) Evaluation of current treatment plan related to  self management and patient's adherence to plan as established by provider   Heart Failure Interventions:  (Status:  Goal on track:  Yes.) Long Term Goal Discussed importance of daily weight and advised patient to weigh and record daily Discussed the importance of keeping all appointments with provider Reviewed heart failure action plan and zones Reviewed medications with patient and discussed importance of adherence.   Diabetes Interventions:  (Status:  Goal on track:  Yes.) Long Term Goal Assessed patient's understanding of A1c goal:  7.9 Reviewed medications with patient and discussed importance of medication adherence Discussed plans with patient for ongoing care management follow up and provided patient with direct contact information for care management team Reviewed scheduled/upcoming provider appointments Advised patient to sent her primary care provider a Mychart message informing him of elevated  fasting blood sugars ranging from 345 - 405. Request virtual visit if unable to get to office.  Lab Results  Component Value Date   HGBA1C 7.9 (H) 02/01/2021     Urinary symptom intervention:   (Status:  New goal.)  Short Term Goal Evaluation of current treatment plan related to  Urinary tract symptoms ,  self-management and patient's adherence to plan as established by provider. Discussed plans with patient for ongoing care management follow up and provided patient with direct contact information for care management team Advised patient to call or send Mychart message reporting possible urinary  tract symptoms Advised patient Per primary care provider, call office and schedule follow up visit regarding elevated blood sugar and urinary tract symptoms.   Patient Goals/Self-Care Activities: Recommendation from Dr. Sharen Hones:  Call primary care provider office and schedule follow up visit regarding elevated blood sugars and urinary tract symptoms as soon as possible. Take medications as prescribed   Attend all scheduled provider appointments Call pharmacy for medication refills 3-7 days in advance of running out of medications Call provider office for new concerns or questions  call office if I gain more than 3 pounds in one day or 5 pounds in one week do ankle pumps when sitting and keep legs up while sitting, watch for swelling in feet, ankles and legs every day. Call your doctor for non emergent symptoms. Evaluate your symptoms based on your Heart failure action plan. If you are in the yellow zone call your doctor to report symptoms.   Call 911 for severe symptoms  Continue to weigh daily and record.   Continue to follow a low salt diet and adhere to fluid restriction recommendations.   Wear oxygen as advised by provider and check oxygen saturations daily Continue to check blood sugar at least 2-3 times per day.  Always recheck blood sugar after an elevated  or low reading.  Send primary care  provider Mychart message informing him of elevated fasting blood sugars.  Request video visit if unable to get to provider office.   RULE OF 15 How to treat low blood sugars (Blood sugar less than 70 mg/dl  Please follow the RULE OF 15 for the treatment of hypoglycemia treatment (When your blood sugars are less than 70 mg/ dl) STEP  1:  Take 15 grams of carbohydrates when your blood sugar is low, which includes:   3-4 glucose tabs or  3-4 oz of juice or regular soda or  One tube of glucose gel STEP 2:  Recheck blood sugar in 15 minutes STEP 3:  If your blood sugar is still low at the 15 minute recheck ---then, go back to STEP 1 and treat again with another 15 grams of carbohydrates     Plan:The patient has been provided with contact information for the care management team and has been advised to call with any health related questions or concerns.  The care management team will reach out to the patient again over the next 30  days. George Ina RN,BSN,CCM RN Case Manager [facility] [place]  [phone]

## 2021-11-15 DIAGNOSIS — Z20822 Contact with and (suspected) exposure to covid-19: Secondary | ICD-10-CM | POA: Diagnosis not present

## 2021-11-21 ENCOUNTER — Telehealth: Payer: Self-pay

## 2021-11-21 NOTE — Telephone Encounter (Signed)
Plz schedule OV for pt for elevated blood sugar and urinary issues.

## 2021-11-21 NOTE — Telephone Encounter (Signed)
Pt scheduled  

## 2021-11-25 DIAGNOSIS — Z20822 Contact with and (suspected) exposure to covid-19: Secondary | ICD-10-CM | POA: Diagnosis not present

## 2021-11-26 DIAGNOSIS — I509 Heart failure, unspecified: Secondary | ICD-10-CM | POA: Diagnosis not present

## 2021-11-26 DIAGNOSIS — E1159 Type 2 diabetes mellitus with other circulatory complications: Secondary | ICD-10-CM | POA: Diagnosis not present

## 2021-12-01 DIAGNOSIS — Z20828 Contact with and (suspected) exposure to other viral communicable diseases: Secondary | ICD-10-CM | POA: Diagnosis not present

## 2021-12-02 ENCOUNTER — Encounter: Payer: Self-pay | Admitting: Family Medicine

## 2021-12-02 NOTE — Telephone Encounter (Signed)
Amy Barnes (DPR signed said that pt complaining of frequency of urine with burning upon urination. Pt has usual sense of urgency with urination and no abd or back pain and has not seen blood. No fever. Amy Barnes said pt has mobility issues and wants to know if someone could pick up sterile urine container and necessary supplies and bring specimen back to [facility]. Has appt already scheduled with Dr Reece Agar on 12/17/21. Amy Barnes request cb after reviewed by Dr Emmaline Life note to DR Reece Agar and Misty Stanley CMA and will teams Misty Stanley also. ?

## 2021-12-03 ENCOUNTER — Telehealth (INDEPENDENT_AMBULATORY_CARE_PROVIDER_SITE_OTHER): Payer: Medicare Other | Admitting: Internal Medicine

## 2021-12-03 ENCOUNTER — Other Ambulatory Visit: Payer: Self-pay

## 2021-12-03 DIAGNOSIS — B372 Candidiasis of skin and nail: Secondary | ICD-10-CM | POA: Diagnosis not present

## 2021-12-03 DIAGNOSIS — T847XXD Infection and inflammatory reaction due to other internal orthopedic prosthetic devices, implants and grafts, subsequent encounter: Secondary | ICD-10-CM

## 2021-12-03 DIAGNOSIS — R300 Dysuria: Secondary | ICD-10-CM

## 2021-12-03 NOTE — Progress Notes (Signed)
Virtual Visit via Telephone Note ? ?I connected with Amy Barnes on 12/03/21 at  4:15 PM EST by telephone and verified that I am speaking with the correct person using two identifiers. ? ?Location: ?Patient: at home ?Provider: in clinic ?  ?I discussed the limitations, risks, security and privacy concerns of performing an evaluation and management service by telephone and the availability of in person appointments. I also discussed with the patient that there may be a patient responsible charge related to this service. The patient expressed understanding and agreed to proceed. ? ? 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 No fever,chills. Has frequent urination due to diuretics. +/- flank pain but states she has chronic back pain. She is also noticing increasing candidal skin infection, not improved significantly with topical antifungal cream ?  ?Observations/Objective: ?Fluid speech, daughter also on the line to help with answering questions ? ?Assessment and Plan: ?Presumed uti = will have her do ua and urine culture to be dropped off on 3/8. We are limited with some abtx to use due to many abtx allergies. Will give cipro 500mg  bid x 3 days ?Cutaneous candidal infection = has intermittent flares. Will recommend cotton clothing. Will temp decrease her doxy to once a day for 7 days and give fluconazole 400mg  daily x 7 days which has helped in the past ? 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 ?See her pcp in 2 wk for follow up ? ? We will see her in 4 mo. ? ?Follow Up Instructions:see above ? ?  ?I discussed the assessment and treatment plan with the patient. The patient was provided an opportunity to ask questions and all were answered. The  patient agreed with the plan and demonstrated an understanding of the instructions. ?  ?The patient was advised to call back or seek an in-person evaluation if the symptoms worsen or if the condition fails to improve as anticipated. ? ?I provided 10 minutes of non-face-to-face time during this encounter. ? ? ? , MD  ?

## 2021-12-03 NOTE — Telephone Encounter (Signed)
I did not get to this message before 5pm yesterday.  ?Yes ok to do this if not addressed by ID today.  ?

## 2021-12-03 NOTE — Telephone Encounter (Signed)
Spoke with pt relaying Dr. Timoteo Expose message.  States Dr. Drue Second is addressing UTI sxs and pt is providing them a urine sample to be tested. Fyi to Dr. Reece Agar.  ?

## 2021-12-04 ENCOUNTER — Other Ambulatory Visit: Payer: Self-pay

## 2021-12-04 ENCOUNTER — Other Ambulatory Visit: Payer: Medicare Other

## 2021-12-04 DIAGNOSIS — R300 Dysuria: Secondary | ICD-10-CM | POA: Diagnosis not present

## 2021-12-06 LAB — URINE CULTURE
MICRO NUMBER:: 13104812
SPECIMEN QUALITY:: ADEQUATE

## 2021-12-06 LAB — URINALYSIS, ROUTINE W REFLEX MICROSCOPIC
Bilirubin Urine: NEGATIVE
Hgb urine dipstick: NEGATIVE
Ketones, ur: NEGATIVE
Leukocytes,Ua: NEGATIVE
Nitrite: NEGATIVE
Protein, ur: NEGATIVE
Specific Gravity, Urine: 1.021 (ref 1.001–1.035)
pH: 6 (ref 5.0–8.0)

## 2021-12-09 ENCOUNTER — Telehealth: Payer: Self-pay

## 2021-12-09 DIAGNOSIS — R300 Dysuria: Secondary | ICD-10-CM

## 2021-12-09 MED ORDER — FOSFOMYCIN TROMETHAMINE 3 G PO PACK: PACK | ORAL | 0 refills | Status: DC

## 2021-12-09 NOTE — Telephone Encounter (Signed)
Order confirmed with Dr. Drue Second and Rexene Alberts, NP. ? ?Sandie Ano, RN ? ?

## 2021-12-09 NOTE — Telephone Encounter (Addendum)
Per Dr. Drue Second, patient needs fosfomycin 3gm  x 3 doses, every 3 days.  ? ?Called patient to confirm pharmacy, no answer and voicemail box full.  ? ?Sandie Ano, RN ? ?

## 2021-12-09 NOTE — Addendum Note (Signed)
Addended by: Linna Hoff D on: 12/09/2021 02:36 PM ? ? Modules accepted: Orders ? ?

## 2021-12-10 DIAGNOSIS — Z20822 Contact with and (suspected) exposure to covid-19: Secondary | ICD-10-CM | POA: Diagnosis not present

## 2021-12-12 ENCOUNTER — Telehealth: Payer: Self-pay | Admitting: Acute Care

## 2021-12-12 NOTE — Telephone Encounter (Signed)
Called to schedule annual LDCT.  No answer and no voicemail available ?

## 2021-12-13 ENCOUNTER — Ambulatory Visit (INDEPENDENT_AMBULATORY_CARE_PROVIDER_SITE_OTHER): Payer: Medicare Other

## 2021-12-13 DIAGNOSIS — I5020 Unspecified systolic (congestive) heart failure: Secondary | ICD-10-CM

## 2021-12-13 DIAGNOSIS — E1142 Type 2 diabetes mellitus with diabetic polyneuropathy: Secondary | ICD-10-CM

## 2021-12-13 NOTE — Chronic Care Management (AMB) (Signed)
?Chronic Care Management  ? ?CCM RN Visit Note ? ?12/13/2021 ?Name: Amy Barnes MRN: [id number] DOB: 05/09/1951 ? ?Subjective: ?Amy Barnes is a 71 y.o. year old female who is a primary care patient of Ria Bush, MD. The care management team was consulted for assistance with disease management and care coordination needs.   ? ?Engaged with patient by telephone for follow up visit in response to provider referral for case management and/or care coordination services.  ? ?Consent to Services:  ?The patient was given information about Chronic Care Management services, agreed to services, and gave verbal consent prior to initiation of services.  Please see initial visit note for detailed documentation.  ? ?Patient agreed to services and verbal consent obtained.  ? ?Assessment: Review of patient past medical history, allergies, medications, health status, including review of consultants reports, laboratory and other test data, was performed as part of comprehensive evaluation and provision of chronic care management services.  ? ?SDOH (Social Determinants of Health) assessments and interventions performed:   ? ?CCM Care Plan ? ?Allergies  ?Allergen Reactions  ? Lisbeth Ply [Fesoterodine] Nausea And Vomiting  ?  Severe reaction - s/p several ER visits then hospitalization ?stress induced cardiomyopathy  ? Cephalexin Hives  ? Hydrocodone Other (See Comments)  ?  Reaction:  Hallucinations   ? Entresto [Sacubitril-Valsartan]   ? Macrodantin [Nitrofurantoin]   ?  Dyspnea, lips peeling  ? Tolterodine Tartrate Nausea And Vomiting  ? Risperidone And Related Other (See Comments)  ?  Reaction:  Made pt excessively sleepy  ? Seroquel [Quetiapine Fumarate] Other (See Comments)  ?  Reaction:  Made pt excessively sleepy  ? Sulfa Antibiotics Rash  ?  Tolerated bactrim course well 06/2020  ? ? ?Outpatient Encounter Medications as of 12/13/2021  ?Medication Sig Note  ? albuterol (PROVENTIL HFA;VENTOLIN HFA) 108 (90 Base) MCG/ACT  inhaler Inhale 2 puffs into the lungs every 6 (six) hours as needed for wheezing or shortness of breath.   ? albuterol (PROVENTIL) (2.5 MG/3ML) 0.083% nebulizer solution Take 3 mLs (2.5 mg total) by nebulization every 6 (six) hours as needed for wheezing or shortness of breath.   ? albuterol (PROVENTIL) (5 MG/ML) 0.5% nebulizer solution Take 0.5 mLs (2.5 mg total) by nebulization every 4 (four) hours as needed for wheezing or shortness of breath.   ? aspirin 81 MG EC tablet Take 81 mg by mouth at bedtime.   ? budesonide-formoterol (SYMBICORT) 160-4.5 MCG/ACT inhaler Inhale 2 puffs into the lungs 2 (two) times daily. (Patient taking differently: Inhale 2 puffs into the lungs 2 (two) times daily. Pt uses as needed.) 02/18/2021: 2 puffs 1 time per day  ? carvedilol (COREG) 6.25 MG tablet TAKE ONE TABLET BY MOUTH TWICE A DAY   ? Cholecalciferol (VITAMIN D) 50 MCG (2000 UT) CAPS Take 1 capsule (2,000 Units total) by mouth daily.   ? dicyclomine (BENTYL) 20 MG tablet Take 1 tablet (20 mg total) by mouth in the morning and at bedtime. (Patient taking differently: Take 20 mg by mouth in the morning and at bedtime. As needed)   ? doxycycline (VIBRA-TABS) 100 MG tablet TAKE ONE TABLET BY MOUTH TWICE A DAY   ? ferrous sulfate 325 (65 FE) MG EC tablet Take 1 tablet (325 mg total) by mouth every Monday, Wednesday, and Friday. 02/18/2021: Daughter states patient has not started yet. Daughter will pick up medicine for patient.   ? fluconazole (DIFLUCAN) 200 MG tablet Take 200 mg by mouth daily. Daughter states patient  is taking 1 in am 02/18/2021: As needed   ? FLUoxetine (PROZAC) 40 MG capsule Take 40 mg by mouth daily.    ? fosfomycin (MONUROL) 3 g PACK Take 3 grams by mouth every 3 days for three doses   ? furosemide (LASIX) 20 MG tablet Take 2 tablets (40 mg total) by mouth daily. And as directed   ? gabapentin (NEURONTIN) 100 MG capsule TAKE ONE TO TWO CAPSULES BY MOUTH AT BEDTIME   ? lamoTRIgine (LAMICTAL) 200 MG tablet Take  400 mg by mouth daily.   ? levothyroxine (SYNTHROID) 150 MCG tablet TAKE ONE TABLET BY MOUTH AT BEDTIME   ? metFORMIN (GLUCOPHAGE) 500 MG tablet Take 1 tablet (500 mg total) by mouth daily with breakfast. (Patient taking differently: Take 500 mg by mouth daily with breakfast. Pt takes at bedtime)   ? montelukast (SINGULAIR) 10 MG tablet TAKE ONE TABLET BY MOUTH EACH NIGHT AT BEDTIME   ? Multiple Vitamin (MULTIVITAMIN WITH MINERALS) TABS tablet Take 2 tablets by mouth daily.    ? ondansetron (ZOFRAN ODT) 8 MG disintegrating tablet Take 1 tablet (8 mg total) by mouth 2 (two) times daily as needed for nausea or vomiting.   ? Oxcarbazepine (TRILEPTAL) 300 MG tablet Take 300 mg by mouth 2 (two) times daily. Pt takes 1 tablet at bedtime (Patient not taking: Reported on 02/18/2021)   ? pantoprazole (PROTONIX) 40 MG tablet TAKE ONE TABLET BY MOUTH TWICE A DAY   ? polyethylene glycol (MIRALAX / GLYCOLAX) 17 g packet Take 17 g by mouth 2 (two) times daily as needed for mild constipation.   ? potassium chloride (KLOR-CON) 10 MEQ tablet Take 1 tablet (10 mEq total) by mouth daily. With lasix. May take an additional 10 meq when taking 60 meq of lasix   ? prochlorperazine (COMPAZINE) 10 MG tablet Take 1 tablet (10 mg total) by mouth every 6 (six) hours as needed for nausea or vomiting.   ? promethazine (PHENERGAN) 25 MG suppository Place 1 suppository (25 mg total) rectally every 6 (six) hours as needed for nausea or vomiting.   ? simvastatin (ZOCOR) 40 MG tablet TAKE ONE TABLET BY MOUTH EVERY EVENING   ? sucralfate (CARAFATE) 1 GM/10ML suspension Take 10 mLs (1 g total) by mouth 4 (four) times daily -  with meals and at bedtime. (Patient taking differently: Take 1 g by mouth 4 (four) times daily -  with meals and at bedtime. Pt takes as needed.)   ? ?No facility-administered encounter medications on file as of 12/13/2021.  ? ? ?Patient Active Problem List  ? Diagnosis Date Noted  ? Medicare annual wellness visit, subsequent  02/09/2021  ? DNR (do not resuscitate) 05/31/2020  ? Chronic respiratory failure with hypoxia, on home O2 therapy ([place]) 05/04/2020  ? Obesity hypoventilation syndrome ([place]) 05/04/2020  ? LVH (left ventricular hypertrophy) 05/04/2020  ? Pneumonia due to COVID-19 virus 05/03/2020  ? Non-ischemic cardiomyopathy ([place]) 12/29/2019  ? HFrEF (heart failure with reduced ejection fraction) ([place]) 11/25/2019  ? UTI (urinary tract infection) 11/25/2019  ? NSTEMI (non-ST elevated myocardial infarction) ([place]) 11/09/2019  ? QT prolongation 11/09/2019  ? Hyponatremia 11/03/2019  ? Diffuse abdominal pain 11/03/2019  ? Nausea and vomiting 11/03/2019  ? Diminished pulses in lower extremity 08/11/2019  ? Osteopenia 08/11/2019  ? Wound infection complicating hardware ([place]) 04/01/2019  ? Lumbar adjacent segment disease with spondylolisthesis 03/23/2019  ? Neurogenic urinary incontinence 11/29/2018  ? Urinary incontinence 11/29/2018  ? Bladder spasms 11/29/2018  ? Discogenic  low back pain 11/29/2018  ? Chronic sacroiliac joint pain (Bilateral) (R>L) 11/29/2018  ? Spondylosis without myelopathy or radiculopathy, lumbar region 11/09/2018  ? Chronic low back pain (Bilateral) (L>R) w/o sciatica 11/09/2018  ? History of hip replacement (Left) 10/13/2018  ? History of back surgery 10/13/2018  ? Vitamin D insufficiency 10/13/2018  ? Osteoarthritis of facet joint of lumbar spine 10/13/2018  ? Osteoarthritis involving multiple joints 10/13/2018  ? Cervical radiculopathy (Left) 10/13/2018  ? Lumbar facet arthropathy (Bilateral) 10/13/2018  ? Lumbar facet syndrome (Bilateral) (L>R) 10/13/2018  ? Abnormal MRI, lumbar spine (09/16/2018) 10/13/2018  ? Lumbar nerve root compression (L4) (Left) 10/13/2018  ? Lumbar foraminal stenosis 10/13/2018  ? Chronic hip pain after total replacement x 3 (Left) 10/13/2018  ? 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 Chronic low back pain (Primary Area of Pain) (Bilateral) (L>R) w/ sciatica (Left) 09/14/2018  ?  Chronic lower extremity pain (Secondary Area of Pain) (Left) 09/14/2018  ? Chronic hip pain [facility] Area of Pain) (Bilateral) (L>R) 09/14/2018  ? Chronic knee pain (Fourth Area of Pain) (Bilateral) (R>L) 09/14/2018

## 2021-12-13 NOTE — Patient Instructions (Signed)
Visit Information ? ?Thank you for taking time to visit with me today. Please don't hesitate to contact me if I can be of assistance to you before our next scheduled telephone appointment. ? ?Following are the goals we discussed today:  ?Continue to take medications as prescribed   ?Attend all scheduled provider appointments ?Call pharmacy for medication refills 3-7 days in advance of running out of medications ?Call provider office for new concerns or questions  ?call office for weight gain of 3 pounds overnight or  5 pounds in one week.  Continue to weigh daily and record ?do ankle pumps when sitting and keep legs up while sitting, watch for swelling in feet, ankles and legs every day. Call your doctor for non emergent symptoms. Evaluate your symptoms based on your Heart failure action plan. If you are in the yellow zone call your doctor to report symptoms.   Call 911 for severe symptoms ?Continue to follow a low salt diet and adhere to fluid restriction recommendations.   ?Wear oxygen as advised by provider and check oxygen saturations daily ?Continue to check blood sugar at least 2-3 times per day.  Always recheck blood sugar after an elevated  or low reading.  ? ?Our next appointment is by telephone on 01/24/22 at 11:00 am ? ?Please call the care guide team at [phone] if you need to cancel or reschedule your appointment.  ? ?If you are experiencing a Mental Health or Behavioral Health Crisis or need someone to talk to, please call the Suicide and Crisis Lifeline: 988 ?call 1-800-273-TALK (toll free, 24 hour hotline)  ? ?Patient verbalizes understanding of instructions and care plan provided today and agrees to view in MyChart. Active MyChart status confirmed with patient.   ? ?Amy Ina RN,BSN,CCM ?RN Case Manager ?Amy Barnes  ?[phone] ? ?

## 2021-12-14 ENCOUNTER — Other Ambulatory Visit: Payer: Self-pay | Admitting: Internal Medicine

## 2021-12-14 DIAGNOSIS — R300 Dysuria: Secondary | ICD-10-CM

## 2021-12-15 ENCOUNTER — Other Ambulatory Visit: Payer: Self-pay | Admitting: Cardiology

## 2021-12-15 ENCOUNTER — Other Ambulatory Visit: Payer: Self-pay | Admitting: Internal Medicine

## 2021-12-17 ENCOUNTER — Encounter: Payer: Self-pay | Admitting: Family Medicine

## 2021-12-17 ENCOUNTER — Other Ambulatory Visit: Payer: Self-pay

## 2021-12-17 ENCOUNTER — Ambulatory Visit (INDEPENDENT_AMBULATORY_CARE_PROVIDER_SITE_OTHER): Payer: Medicare Other | Admitting: Family Medicine

## 2021-12-17 VITALS — BP 134/78 | HR 80 | Temp 97.6°F | Ht 64.0 in | Wt 368.1 lb

## 2021-12-17 DIAGNOSIS — E1142 Type 2 diabetes mellitus with diabetic polyneuropathy: Secondary | ICD-10-CM | POA: Diagnosis not present

## 2021-12-17 DIAGNOSIS — F3110 Bipolar disorder, current episode manic without psychotic features, unspecified: Secondary | ICD-10-CM | POA: Diagnosis not present

## 2021-12-17 DIAGNOSIS — I10 Essential (primary) hypertension: Secondary | ICD-10-CM | POA: Diagnosis not present

## 2021-12-17 DIAGNOSIS — G4733 Obstructive sleep apnea (adult) (pediatric): Secondary | ICD-10-CM | POA: Diagnosis not present

## 2021-12-17 DIAGNOSIS — I5020 Unspecified systolic (congestive) heart failure: Secondary | ICD-10-CM | POA: Diagnosis not present

## 2021-12-17 DIAGNOSIS — N390 Urinary tract infection, site not specified: Secondary | ICD-10-CM | POA: Diagnosis not present

## 2021-12-17 DIAGNOSIS — E662 Morbid (severe) obesity with alveolar hypoventilation: Secondary | ICD-10-CM | POA: Diagnosis not present

## 2021-12-17 DIAGNOSIS — E039 Hypothyroidism, unspecified: Secondary | ICD-10-CM | POA: Diagnosis not present

## 2021-12-17 DIAGNOSIS — E1165 Type 2 diabetes mellitus with hyperglycemia: Secondary | ICD-10-CM

## 2021-12-17 DIAGNOSIS — D649 Anemia, unspecified: Secondary | ICD-10-CM

## 2021-12-17 DIAGNOSIS — E1169 Type 2 diabetes mellitus with other specified complication: Secondary | ICD-10-CM

## 2021-12-17 DIAGNOSIS — Z6841 Body Mass Index (BMI) 40.0 and over, adult: Secondary | ICD-10-CM

## 2021-12-17 DIAGNOSIS — E785 Hyperlipidemia, unspecified: Secondary | ICD-10-CM

## 2021-12-17 DIAGNOSIS — J432 Centrilobular emphysema: Secondary | ICD-10-CM | POA: Diagnosis not present

## 2021-12-17 LAB — CBC WITH DIFFERENTIAL/PLATELET
Basophils Absolute: 0 10*3/uL (ref 0.0–0.1)
Basophils Relative: 0.5 % (ref 0.0–3.0)
Eosinophils Absolute: 0.1 10*3/uL (ref 0.0–0.7)
Eosinophils Relative: 3.2 % (ref 0.0–5.0)
HCT: 40.4 % (ref 36.0–46.0)
Hemoglobin: 13.1 g/dL (ref 12.0–15.0)
Lymphocytes Relative: 25.7 % (ref 12.0–46.0)
Lymphs Abs: 1.1 10*3/uL (ref 0.7–4.0)
MCHC: 32.5 g/dL (ref 30.0–36.0)
MCV: 90.2 fl (ref 78.0–100.0)
Monocytes Absolute: 0.5 10*3/uL (ref 0.1–1.0)
Monocytes Relative: 10.5 % (ref 3.0–12.0)
Neutro Abs: 2.7 10*3/uL (ref 1.4–7.7)
Neutrophils Relative %: 60.1 % (ref 43.0–77.0)
Platelets: 208 10*3/uL (ref 150.0–400.0)
RBC: 4.48 Mil/uL (ref 3.87–5.11)
RDW: 15.2 % (ref 11.5–15.5)
WBC: 4.4 10*3/uL (ref 4.0–10.5)

## 2021-12-17 LAB — POCT GLYCOSYLATED HEMOGLOBIN (HGB A1C): Hemoglobin A1C: 13.7 % — AB (ref 4.0–5.6)

## 2021-12-17 LAB — LIPID PANEL
Cholesterol: 135 mg/dL (ref 0–200)
HDL: 57.8 mg/dL (ref >39.00)
LDL Cholesterol: 54 mg/dL (ref 0–99)
NonHDL: 77.08
Total CHOL/HDL Ratio: 2
Triglycerides: 113 mg/dL (ref 0.0–149.0)
VLDL: 22.6 mg/dL (ref 0.0–40.0)

## 2021-12-17 LAB — COMPREHENSIVE METABOLIC PANEL
ALT: 22 U/L (ref 0–35)
AST: 16 U/L (ref 0–37)
Albumin: 4.5 g/dL (ref 3.5–5.2)
Alkaline Phosphatase: 70 U/L (ref 39–117)
BUN: 15 mg/dL (ref 6–23)
CO2: 32 mEq/L (ref 19–32)
Calcium: 10.1 mg/dL (ref 8.4–10.5)
Chloride: 96 mEq/L (ref 96–112)
Creatinine, Ser: 0.7 mg/dL (ref 0.40–1.20)
GFR: 88.18 mL/min (ref >60.00)
Glucose, Bld: 407 mg/dL — ABNORMAL HIGH (ref 70–99)
Potassium: 4.5 mEq/L (ref 3.5–5.1)
Sodium: 136 mEq/L (ref 135–145)
Total Bilirubin: 0.6 mg/dL (ref 0.2–1.2)
Total Protein: 6.6 g/dL (ref 6.0–8.3)

## 2021-12-17 LAB — TSH: TSH: 2.37 u[IU]/mL (ref 0.35–5.50)

## 2021-12-17 MED ORDER — OZEMPIC (0.25 OR 0.5 MG/DOSE) 2 MG/1.5ML ~~LOC~~ SOPN: PEN_INJECTOR | SUBCUTANEOUS | 6 refills | Status: DC

## 2021-12-17 NOTE — Progress Notes (Signed)
? ? Patient ID: Amy Barnes, female    DOB: Mar 19, 1951, 71 y.o.   MRN: [id number] ? ?This visit was conducted in person. ? ?BP 134/78   Pulse 80   Temp 97.6 ?F (36.4 ?C) (Temporal)   Ht 5\' 4"  (1.626 m)   Wt (!) 368 lb 2 oz (167 kg)   LMP  (LMP Unknown)   SpO2 96% Comment: 2 L  BMI 63.19 kg/m?   ? ?CC: follow up visit, discuss high sugars ?Subjective:  ? ?HPI: ?Amy Barnes is a 71 y.o. female presenting on 12/17/2021 for Diabetes (Here for f/u.  Also, c/o low back pressure after UTI dx by ID. Prescribed Monurol, last dose yesterday. Pt accompanied by daughter, 12/19/2021.  ) ? ? ?I last saw patient 01/2021 for wellness visit.  ? ?Saw ID virtually earlier this month - recurrent UTI symptoms. UCx grew >100k ESBL Klebsiella - abx changed from cipro to fosfomycin every 3 days x 3 doses. She finished this yesterday. Still notes mild vaginal pressure but no dysuria.  ? 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  ?H/o recurrent UTI (proteus colonization).  ?H/o multiple drug allergies (sulfa, keflex, macrodantin) ?Has noted increasing candidal skin infections unresponsive to topical antifungal cream - s/p recent treatment with fluconazole 400mg  daily x7d course.  ? ?H/o HFrEF, has seen Dr 02/2021 only on carvedilol.  ?[facility]VS with OSA on BiPAP, has seen Dr for this.  ?Continues daily symbicort 2 puffs BID - with rare albuterol, none in months.  ? ?Stress incontinence followed by urology - previously on oxybutynin then Toviaz and Detrol (both caused nausea and vomiting). Now off medication.  ? ?1 year h/o dysphagia - progressively worsening, both solids and liquids. EGD 10/2019 ([facility]) overall normal. Consider gastric emptying study at that time. No early satiety, nausea, vomiting, unexpected weight changes, fevers/chills. Intermittent L neck pain. She continues pantoprazole 40mg  BID with PRN carafate.  ? ?She is taking arthritis strength tylenol 1-2 tabs daily. Not on opiate at this time.  ? ?HTN -  continues carvedilol 6.25mg  bid. Home BP readings sometimes run a bit high - 140-150s systolic. She also takes lasix 40mg  daily with extra PRN. Occasional HA, vision changes, CP/tightness, SOB. Minimal leg swelling.  ? ?DM - does regularly check fasting sugars every few days - 430. Compliant with antihyperglycemic regimen which includes: diet controlled. Metformin was stopped due to increased thirst. Denies low sugars or hypoglycemic symptoms. Notes blurry vision. No paresthesias noted. Last diabetic eye exam 11/2020. Glucometer brand: unsure. Last foot exam: 07/2020 - DUE. DSME: 02/2019 at [facility]. No fmhx thyroid cancer.  ?Lab Results  ?Component Value Date  ? HGBA1C 13.7 (A) 12/17/2021  ? ?Diabetic Foot Exam - Simple   ?No data filed ?  ? ?Lab Results  ?Component Value Date  ? MICROALBUR 0.8 02/01/2021  ?  ? ?   ? ?Relevant past medical, surgical, family and social history reviewed and updated as indicated. Interim medical history since our last visit reviewed. ?Allergies and medications reviewed and updated. ?Outpatient Medications Prior to Visit  ?Medication Sig Dispense Refill  ? albuterol (PROVENTIL HFA;VENTOLIN HFA) 108 (90 Base) MCG/ACT inhaler Inhale 2 puffs into the lungs every 6 (six) hours as needed for wheezing or shortness of breath. 1 Inhaler 6  ? albuterol (PROVENTIL) (2.5 MG/3ML) 0.083% nebulizer solution Take 3 mLs (2.5 mg total) by nebulization every 6 (six) hours as needed for wheezing or shortness of breath. 75 mL 12  ? albuterol (  PROVENTIL) (5 MG/ML) 0.5% nebulizer solution Take 0.5 mLs (2.5 mg total) by nebulization every 4 (four) hours as needed for wheezing or shortness of breath. 20 mL 6  ? aspirin 81 MG EC tablet Take 81 mg by mouth at bedtime.    ? budesonide-formoterol (SYMBICORT) 160-4.5 MCG/ACT inhaler Inhale 2 puffs into the lungs 2 (two) times daily. (Patient taking differently: Inhale 2 puffs into the lungs 2 (two) times daily. Pt uses as needed.) 1 Inhaler 3  ? carvedilol (COREG) 6.25  MG tablet TAKE ONE TABLET BY MOUTH TWICE A DAY 60 tablet 0  ? Cholecalciferol (VITAMIN D) 50 MCG (2000 UT) CAPS Take 1 capsule (2,000 Units total) by mouth daily. 30 capsule   ? dicyclomine (BENTYL) 20 MG tablet Take 1 tablet (20 mg total) by mouth in the morning and at bedtime. (Patient taking differently: Take 20 mg by mouth in the morning and at bedtime. As needed) 60 tablet 2  ? doxycycline (VIBRA-TABS) 100 MG tablet TAKE ONE TABLET BY MOUTH TWICE A DAY 180 tablet 1  ? ferrous sulfate 325 (65 FE) MG EC tablet Take 1 tablet (325 mg total) by mouth every Monday, Wednesday, and Friday.    ? fluconazole (DIFLUCAN) 200 MG tablet Take 200 mg by mouth daily. Daughter states patient is taking 1 in am    ? gabapentin (NEURONTIN) 100 MG capsule TAKE ONE TO TWO CAPSULES BY MOUTH AT BEDTIME 60 capsule 6  ? lamoTRIgine (LAMICTAL) 200 MG tablet Take 400 mg by mouth daily.    ? levothyroxine (SYNTHROID) 150 MCG tablet TAKE ONE TABLET BY MOUTH AT BEDTIME 90 tablet 3  ? montelukast (SINGULAIR) 10 MG tablet TAKE ONE TABLET BY MOUTH EACH NIGHT AT BEDTIME 90 tablet 3  ? Multiple Vitamin (MULTIVITAMIN WITH MINERALS) TABS tablet Take 2 tablets by mouth daily.     ? ondansetron (ZOFRAN ODT) 8 MG disintegrating tablet Take 1 tablet (8 mg total) by mouth 2 (two) times daily as needed for nausea or vomiting. 10 tablet 0  ? pantoprazole (PROTONIX) 40 MG tablet TAKE ONE TABLET BY MOUTH TWICE A DAY 180 tablet 1  ? polyethylene glycol (MIRALAX / GLYCOLAX) 17 g packet Take 17 g by mouth 2 (two) times daily as needed for mild constipation. 14 each 0  ? potassium chloride (KLOR-CON) 10 MEQ tablet Take 1 tablet (10 mEq total) by mouth daily. With lasix. May take an additional 10 meq when taking 60 meq of lasix 40 tablet 6  ? prochlorperazine (COMPAZINE) 10 MG tablet Take 1 tablet (10 mg total) by mouth every 6 (six) hours as needed for nausea or vomiting. 30 tablet 1  ? promethazine (PHENERGAN) 25 MG suppository Place 1 suppository (25 mg total)  rectally every 6 (six) hours as needed for nausea or vomiting. 12 each 0  ? PROZAC 20 MG capsule Take 60 mg by mouth daily.    ? simvastatin (ZOCOR) 40 MG tablet TAKE ONE TABLET BY MOUTH EVERY EVENING 90 tablet 1  ? sucralfate (CARAFATE) 1 GM/10ML suspension Take 10 mLs (1 g total) by mouth 4 (four) times daily -  with meals and at bedtime. (Patient taking differently: Take 1 g by mouth 4 (four) times daily -  with meals and at bedtime. Pt takes as needed.) 420 mL 0  ? FLUoxetine (PROZAC) 40 MG capsule Take 40 mg by mouth daily.     ? metFORMIN (GLUCOPHAGE) 500 MG tablet Take 1 tablet (500 mg total) by mouth daily with breakfast. (Patient  not taking: Reported on 12/17/2021) 90 tablet 3  ? fosfomycin (MONUROL) 3 g PACK Take 3 grams by mouth every 3 days for three doses 9 g 0  ? furosemide (LASIX) 20 MG tablet Take 2 tablets (40 mg total) by mouth daily. And as directed 180 tablet 3  ? Oxcarbazepine (TRILEPTAL) 300 MG tablet Take 300 mg by mouth 2 (two) times daily. Pt takes 1 tablet at bedtime (Patient not taking: Reported on 02/18/2021)    ? ?No facility-administered medications prior to visit.  ?  ? ?Per HPI unless specifically indicated in ROS section below ?Review of Systems ? ?Objective:  ?BP 134/78   Pulse 80   Temp 97.6 ?F (36.4 ?C) (Temporal)   Ht 5\' 4"  (1.626 m)   Wt (!) 368 lb 2 oz (167 kg)   LMP  (LMP Unknown)   SpO2 96% Comment: 2 L  BMI 63.19 kg/m?   ?Wt Readings from Last 3 Encounters:  ?12/17/21 (!) 368 lb 2 oz (167 kg)  ?04/08/21 (!) 375 lb (170.1 kg)  ?03/15/21 (!) 380 lb (172.4 kg)  ?  ?  ?Physical Exam ?Vitals and nursing note reviewed.  ?Constitutional:   ?   Appearance: She is obese.  ?   Comments: Sitting in wheelchair  ?Eyes:  ?   Extraocular Movements: Extraocular movements intact.  ?   Pupils: Pupils are equal, round, and reactive to light.  ?Neck:  ?   Vascular: No carotid bruit.  ?Cardiovascular:  ?   Rate and Rhythm: Normal rate and regular rhythm.  ?   Pulses: Normal pulses.  ?    Heart sounds: Murmur (2/6 systolic USB) heard.  ?Pulmonary:  ?   Effort: Pulmonary effort is normal. No respiratory distress.  ?   Breath sounds: Normal breath sounds. No wheezing, rhonchi or rales.  ?Musculoskeletal:  ?

## 2021-12-17 NOTE — Patient Instructions (Addendum)
Labs today ?Start ozempic 0.25mg  weekly for 2 weeks then 0.5mg  weekly.  ?Start glimepiride (amaryl) 1mg  daily with breakfast.  ?Start following low sugar low carb diabetic diet.  ?Return in 3 months for physical.  ?

## 2021-12-19 ENCOUNTER — Encounter: Payer: Self-pay | Admitting: Family Medicine

## 2021-12-19 MED ORDER — GLIMEPIRIDE 1 MG PO TABS: 1.0000 mg | ORAL_TABLET | Freq: Every day | ORAL | 9 refills | Status: DC

## 2021-12-19 NOTE — Assessment & Plan Note (Signed)
Sees cardiology, now only on carvedilol ?

## 2021-12-19 NOTE — Assessment & Plan Note (Signed)
Update FLP on simvastatin.  

## 2021-12-19 NOTE — Assessment & Plan Note (Addendum)
Chronic, poor control as evidenced by recent cbg readings as well as A1c 13+% today! ?Discussed risks of untreated diabetes.  ?She stopped metformin concerned it was causing increased thirst. More likely high sugars caused this.  ?Reviewed mechanism of action of GLP1RA as well as side effects to watch for. No fmhx thyroid cancer.  ?Will start weekly ozempic and amaryl 1mg  daily with breakfast. ?I also recommend renewed efforts to follow diabetic diet.  ?RTC 6 wks close DM f/u.  ?

## 2021-12-19 NOTE — Assessment & Plan Note (Signed)
Encouraged ongoing efforts towards weight loss.  ?

## 2021-12-19 NOTE — Assessment & Plan Note (Signed)
Stable period on carvedilol 6.25mg  - I did ask her to monitor readings and bring BP log to next cardiology appointment.  ?

## 2021-12-19 NOTE — Assessment & Plan Note (Signed)
Update TSH on levothyroxine 150mcg daily.  

## 2021-12-19 NOTE — Assessment & Plan Note (Signed)
Recent ESBL Klebsiella UTI treated with fosfomycin 300mg  Q72 hours x 3 doses.  ?Symptoms largely improved.  ?

## 2021-12-19 NOTE — Assessment & Plan Note (Signed)
Stable period on prozac and lamictal followed by psychiatry.  ?Now off oxcarbazepine.  ?

## 2021-12-19 NOTE — Assessment & Plan Note (Signed)
Update CBC in h/o anemia.  ?

## 2021-12-19 NOTE — Assessment & Plan Note (Signed)
Managed with symbicort 2 puffs BID ?

## 2021-12-19 NOTE — Assessment & Plan Note (Signed)
With [facility] on BiPap followed by cardiology  ?

## 2021-12-22 ENCOUNTER — Encounter: Payer: Self-pay | Admitting: Cardiology

## 2021-12-22 DIAGNOSIS — I5023 Acute on chronic systolic (congestive) heart failure: Secondary | ICD-10-CM | POA: Insufficient documentation

## 2021-12-22 NOTE — Progress Notes (Signed)
?  ?  ?Cardiology Office Note ? ? ?Date:  12/23/2021  ? ?ID:  Amy Barnes, DOB [birth year], MRN [id number] ? ?PCP:  [name], MD  ?Cardiologist:   Rollene Rotunda, MD ? ? ?Chief Complaint  ?Patient presents with  ? Shortness of Breath  ? ? ?  ?History of Present Illness: ?Amy Barnes is a 71 y.o. female who seen in the emergency room in February 2021 with nausea and vomiting.  Her troponin was elevated.  Her D-dimer was elevated.  She had a CT scan that was negative for pulmonary embolism but did show coronary calcification and congestive heart failure.  Work up during her hospitalization showed a new CM with an EF of 35-40% and apical WMA by echo on 11/09/2019.  Previous echo 4 days earlier on 11/05/2019 showed an EF of 60-65%.  Diagnostic cath done 11/10/2019 showed normal coronaries.  She has been treated as NICM with Entresto, lasix, and Coreg.  She was in the hospital with hyponatremia.  She is chronically on O2.    ? ?At the last follow up she had increased lower extremity swelling.  I treated her with increased diuresis.  Since she was last seen she is actually done pretty well.  She rarely has to take an extra Lasix pill at which point she takes a potassium.  She says her lower extremity swelling is better.  She still wears chronic O2.  She gets around in a motorized scooter or wheelchair if any distance but she otherwise uses a walker.  She is not having any chest pressure, neck or arm discomfort.  She is not having any new PND or orthopnea.  There has been no new palpitations, presyncope or syncope. ? ? ? ? ?Past Medical History:  ?Diagnosis Date  ? Allergic rhinitis   ? Ambulates with cane   ? straight  ? Anemia   ? Anxiety   ? Asthma   ? seasonal  ? Bipolar affective disorder (HCC)   ? takes Synthroid meds for Bipolar  ? CAD (coronary artery disease) 07/2016  ? by CT scan  ? Carpal tunnel syndrome   ? had surgery but occasional still has some issues per patient  ? Cataract   ? Centrilobular emphysema  (HCC) 07/2016  ? by CT scan - pt not aware of this  ? Depression with anxiety   ? Diabetes mellitus   ? type 2 - no meds diet controlled  ? GERD (gastroesophageal reflux disease)   ? History of MRSA infection 2015  ? left - 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 PO  ? Hyperlipidemia   ? Hypertension   ? Hypothyroidism   ? 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 01/16/2018  ? prevnar 13 - large localized reaction 12/2017  ? OSA (obstructive sleep apnea)   ? no longer using cpap, uses a bed that raises and lowers hob  ? Osteoarthritis   ? Osteopenia 08/11/2019  ? DEXA 07/2019 - T -1.1 R femur (osteopenia)  ? Pneumonia due to COVID-19 virus 05/03/2020  ? Restless legs   ? Septic arthritis (HCC) 10/11/2012  ? Shingles 06/30/2016  ? Thoracic aortic atherosclerosis (HCC) 11/207  ? by CT  ? ? ?Past Surgical History:  ?Procedure Laterality Date  ? BREAST CYST ASPIRATION    ? CARPAL TUNNEL RELEASE Right 05/15/2011  ? CARPAL TUNNEL RELEASE Left 12/24/2017  ? Procedure: LEFT CARPAL TUNNEL RELEASE;  Surgeon: [name], MD;  Location: [facility];  Service: Orthopedics;  Laterality:  Left;  ? CERVICAL FUSION  2012  ? C2/3/4  ? COLONOSCOPY WITH PROPOFOL N/A 12/31/2015  ? diverticulosis, int hem, o/w normal rpt 10 yrs (Rein)  ? ESOPHAGOGASTRODUODENOSCOPY (EGD) WITH PROPOFOL N/A 11/12/2019  ? Procedure: ESOPHAGOGASTRODUODENOSCOPY (EGD) WITH PROPOFOL;  Surgeon: Sherrilyn Rist, MD;  Location: [facility] ENDOSCOPY;  Service: Gastroenterology;  Laterality: N/A;  ? EYE SURGERY Bilateral   ? cataract surgery with lens implant  ? FOOT SURGERY  1982  ? bone spur  ? I & D EXTREMITY  09/06/2012  ? Budd Palmer, MD; Right;  I&D of right thigh  ? I & D EXTREMITY Left 07/2013  ? wound vac - daily doxycycline indefinitely  ? KNEE ARTHROSCOPY  09/01/2012  ? Dannielle Huh, MD;  Right  ? LEFT HEART CATH AND CORONARY ANGIOGRAPHY N/A 11/10/2019  ? Procedure: LEFT HEART CATH AND CORONARY ANGIOGRAPHY;  Surgeon: Lennette Bihari, MD;  Location: [facility] INVASIVE CV  LAB;  Service: Cardiovascular;  Laterality: N/A;  ? LUMBAR FUSION  01/08/2012  ? L3-4  ? LUMBAR FUSION  02/2019  ? unexpectedly discovered MRSA infection - pus Lovell Sheehan)   ? LUMBAR LAMINECTOMY/DECOMPRESSION MICRODISCECTOMY N/A 11/13/2016  ? LAMINOTOMY/LAMINECTOMY LUMBAR FOUR LUMBAR FIVE  WITH RESECTION OF SYNOVIAL CYST;  Surgeon: Tressie Stalker, MD  ? NECK SURGERY    ? Herniated disk C2,3,4  ? NOSE SURGERY    ? PARTIAL HYSTERECTOMY  1984  ? for mennorhagia, ovaries remain  ? REVISION TOTAL HIP ARTHROPLASTY Left 08/28/2011  ? TMJ ARTHROPLASTY  1982  ? TOTAL HIP ARTHROPLASTY Left 2002  ? TRIGGER FINGER RELEASE Right 05/15/2011  ? long finger  ? ? ? ?Current Outpatient Medications  ?Medication Sig Dispense Refill  ? albuterol (PROVENTIL HFA;VENTOLIN HFA) 108 (90 Base) MCG/ACT inhaler Inhale 2 puffs into the lungs every 6 (six) hours as needed for wheezing or shortness of breath. 1 Inhaler 6  ? albuterol (PROVENTIL) (2.5 MG/3ML) 0.083% nebulizer solution Take 3 mLs (2.5 mg total) by nebulization every 6 (six) hours as needed for wheezing or shortness of breath. 75 mL 12  ? albuterol (PROVENTIL) (5 MG/ML) 0.5% nebulizer solution Take 0.5 mLs (2.5 mg total) by nebulization every 4 (four) hours as needed for wheezing or shortness of breath. 20 mL 6  ? aspirin 81 MG EC tablet Take 81 mg by mouth at bedtime.    ? budesonide-formoterol (SYMBICORT) 160-4.5 MCG/ACT inhaler Inhale 2 puffs into the lungs 2 (two) times daily. (Patient taking differently: Inhale 2 puffs into the lungs 2 (two) times daily. Pt uses as needed.) 1 Inhaler 3  ? carvedilol (COREG) 6.25 MG tablet TAKE ONE TABLET BY MOUTH TWICE A DAY 60 tablet 0  ? Cholecalciferol (VITAMIN D) 50 MCG (2000 UT) CAPS Take 1 capsule (2,000 Units total) by mouth daily. 30 capsule   ? dicyclomine (BENTYL) 20 MG tablet Take 1 tablet (20 mg total) by mouth in the morning and at bedtime. (Patient taking differently: Take 20 mg by mouth in the morning and at bedtime. As needed) 60  tablet 2  ? doxycycline (VIBRA-TABS) 100 MG tablet TAKE ONE TABLET BY MOUTH TWICE A DAY 180 tablet 1  ? ferrous sulfate 325 (65 FE) MG EC tablet Take 1 tablet (325 mg total) by mouth every Monday, Wednesday, and Friday.    ? fluconazole (DIFLUCAN) 200 MG tablet Take 200 mg by mouth daily. Daughter states patient is taking 1 in am    ? furosemide (LASIX) 20 MG tablet Take 40 mg by  mouth every morning.    ? gabapentin (NEURONTIN) 100 MG capsule TAKE ONE TO TWO CAPSULES BY MOUTH AT BEDTIME 60 capsule 6  ? glimepiride (AMARYL) 1 MG tablet Take 1 tablet (1 mg total) by mouth daily with breakfast. 30 tablet 9  ? lamoTRIgine (LAMICTAL) 200 MG tablet Take 400 mg by mouth daily.    ? levothyroxine (SYNTHROID) 150 MCG tablet TAKE ONE TABLET BY MOUTH AT BEDTIME 90 tablet 3  ? montelukast (SINGULAIR) 10 MG tablet TAKE ONE TABLET BY MOUTH EACH NIGHT AT BEDTIME 90 tablet 3  ? Multiple Vitamin (MULTIVITAMIN WITH MINERALS) TABS tablet Take 2 tablets by mouth daily.     ? ondansetron (ZOFRAN ODT) 8 MG disintegrating tablet Take 1 tablet (8 mg total) by mouth 2 (two) times daily as needed for nausea or vomiting. 10 tablet 0  ? pantoprazole (PROTONIX) 40 MG tablet TAKE ONE TABLET BY MOUTH TWICE A DAY 180 tablet 1  ? polyethylene glycol (MIRALAX / GLYCOLAX) 17 g packet Take 17 g by mouth 2 (two) times daily as needed for mild constipation. 14 each 0  ? potassium chloride (KLOR-CON) 10 MEQ tablet Take 1 tablet (10 mEq total) by mouth daily. With lasix. May take an additional 10 meq when taking 60 meq of lasix 40 tablet 6  ? prochlorperazine (COMPAZINE) 10 MG tablet Take 1 tablet (10 mg total) by mouth every 6 (six) hours as needed for nausea or vomiting. 30 tablet 1  ? promethazine (PHENERGAN) 25 MG suppository Place 1 suppository (25 mg total) rectally every 6 (six) hours as needed for nausea or vomiting. 12 each 0  ? PROZAC 20 MG capsule Take 60 mg by mouth daily.    ? Semaglutide,0.25 or 0.5MG /DOS, (OZEMPIC, 0.25 OR 0.5 MG/DOSE,) 2  MG/1.5ML SOPN Inject 0.25 mg into the skin once a week for 14 days, THEN 0.5 mg once a week. 2 mL 6  ? simvastatin (ZOCOR) 40 MG tablet TAKE ONE TABLET BY MOUTH EVERY EVENING 90 tablet 1  ? sucralfate (C

## 2021-12-23 ENCOUNTER — Other Ambulatory Visit: Payer: Self-pay

## 2021-12-23 ENCOUNTER — Encounter: Payer: Self-pay | Admitting: Cardiology

## 2021-12-23 ENCOUNTER — Ambulatory Visit (INDEPENDENT_AMBULATORY_CARE_PROVIDER_SITE_OTHER): Payer: Medicare Other | Admitting: Cardiology

## 2021-12-23 VITALS — BP 124/66 | HR 66 | Ht 64.0 in | Wt 372.0 lb

## 2021-12-23 DIAGNOSIS — Z20822 Contact with and (suspected) exposure to covid-19: Secondary | ICD-10-CM | POA: Diagnosis not present

## 2021-12-23 DIAGNOSIS — I10 Essential (primary) hypertension: Secondary | ICD-10-CM

## 2021-12-23 DIAGNOSIS — I5023 Acute on chronic systolic (congestive) heart failure: Secondary | ICD-10-CM

## 2021-12-23 NOTE — Patient Instructions (Signed)
Medication Instructions:  ?Your physician recommends that you continue on your current medications as directed. Please refer to the Current Medication list given to you today. ? ?*If you need a refill on your cardiac medications before your next appointment, please call your pharmacy* ? ? ?Testing/Procedures: ?Your physician has recommended that you have a pulmonary function test. Pulmonary Function Tests are a group of tests that measure how well air moves in and out of your lungs.  ? ? ?Follow-Up: ?At [facility], you and your health needs are our priority.  As part of our continuing mission to provide you with exceptional heart care, we have created designated Provider Care Teams.  These Care Teams include your primary Cardiologist (physician) and Advanced Practice Providers (APPs -  Physician Assistants and Nurse Practitioners) who all work together to provide you with the care you need, when you need it. ? ?We recommend signing up for the patient portal called "MyChart".  Sign up information is provided on this After Visit Summary.  MyChart is used to connect with patients for Virtual Visits (Telemedicine).  Patients are able to view lab/test results, encounter notes, upcoming appointments, etc.  Non-urgent messages can be sent to your provider as well.   ?To learn more about what you can do with MyChart, go to [email or web address].   ? ?Your next appointment:   ?12 month(s) ? ?The format for your next appointment:   ?In Person ? ?Provider:   ?Rollene Rotunda, MD  ?

## 2021-12-25 ENCOUNTER — Encounter: Payer: Self-pay | Admitting: Family Medicine

## 2021-12-25 MED ORDER — BUDESONIDE-FORMOTEROL FUMARATE 160-4.5 MCG/ACT IN AERO: 1.0000 | INHALATION_SPRAY | Freq: Two times a day (BID) | RESPIRATORY_TRACT | 11 refills | Status: DC

## 2021-12-27 DIAGNOSIS — E1159 Type 2 diabetes mellitus with other circulatory complications: Secondary | ICD-10-CM

## 2021-12-27 DIAGNOSIS — Z7984 Long term (current) use of oral hypoglycemic drugs: Secondary | ICD-10-CM

## 2021-12-27 DIAGNOSIS — I509 Heart failure, unspecified: Secondary | ICD-10-CM

## 2021-12-29 DIAGNOSIS — Z20822 Contact with and (suspected) exposure to covid-19: Secondary | ICD-10-CM | POA: Diagnosis not present

## 2021-12-30 MED ORDER — TRULICITY 0.75 MG/0.5ML ~~LOC~~ SOAJ: 0.7500 mg | SUBCUTANEOUS | 0 refills | Status: DC

## 2022-01-01 DIAGNOSIS — Z9841 Cataract extraction status, right eye: Secondary | ICD-10-CM | POA: Diagnosis not present

## 2022-01-01 DIAGNOSIS — H04123 Dry eye syndrome of bilateral lacrimal glands: Secondary | ICD-10-CM | POA: Diagnosis not present

## 2022-01-01 DIAGNOSIS — Z9842 Cataract extraction status, left eye: Secondary | ICD-10-CM | POA: Diagnosis not present

## 2022-01-01 DIAGNOSIS — E119 Type 2 diabetes mellitus without complications: Secondary | ICD-10-CM | POA: Diagnosis not present

## 2022-01-01 DIAGNOSIS — H5213 Myopia, bilateral: Secondary | ICD-10-CM | POA: Diagnosis not present

## 2022-01-01 DIAGNOSIS — H16223 Keratoconjunctivitis sicca, not specified as Sjogren's, bilateral: Secondary | ICD-10-CM | POA: Diagnosis not present

## 2022-01-01 LAB — HM DIABETES EYE EXAM

## 2022-01-02 DIAGNOSIS — Z20822 Contact with and (suspected) exposure to covid-19: Secondary | ICD-10-CM | POA: Diagnosis not present

## 2022-01-03 DIAGNOSIS — Z20822 Contact with and (suspected) exposure to covid-19: Secondary | ICD-10-CM | POA: Diagnosis not present

## 2022-01-04 ENCOUNTER — Other Ambulatory Visit: Payer: Self-pay | Admitting: Family Medicine

## 2022-01-13 ENCOUNTER — Other Ambulatory Visit: Payer: Self-pay

## 2022-01-13 DIAGNOSIS — Z20822 Contact with and (suspected) exposure to covid-19: Secondary | ICD-10-CM | POA: Diagnosis not present

## 2022-01-13 MED ORDER — CARVEDILOL 6.25 MG PO TABS: 6.2500 mg | ORAL_TABLET | Freq: Two times a day (BID) | ORAL | 3 refills | Status: DC

## 2022-01-14 ENCOUNTER — Ambulatory Visit (HOSPITAL_COMMUNITY)
Admission: RE | Admit: 2022-01-14 | Discharge: 2022-01-14 | Disposition: A | Discharge status: Home / Self Care | Payer: Medicare Other | Source: Ambulatory Visit | Attending: Cardiology | Admitting: Cardiology

## 2022-01-14 DIAGNOSIS — I10 Essential (primary) hypertension: Secondary | ICD-10-CM | POA: Insufficient documentation

## 2022-01-14 DIAGNOSIS — I5023 Acute on chronic systolic (congestive) heart failure: Secondary | ICD-10-CM | POA: Insufficient documentation

## 2022-01-14 LAB — PULMONARY FUNCTION TEST
DL/VA % pred: 103 %
DL/VA: 4.29 ml/min/mmHg/L
DLCO unc % pred: 60 %
DLCO unc: 11.81 ml/min/mmHg
FEF 25-75 Post: 2.62 L/sec
FEF 25-75 Pre: 1.65 L/sec
FEF2575-%Change-Post: 59 %
FEF2575-%Pred-Post: 134 %
FEF2575-%Pred-Pre: 84 %
FEV1-%Change-Post: 8 %
FEV1-%Pred-Post: 63 %
FEV1-%Pred-Pre: 58 %
FEV1-Post: 1.45 L
FEV1-Pre: 1.34 L
FEV1FVC-%Change-Post: 6 %
FEV1FVC-%Pred-Pre: 110 %
FEV6-%Change-Post: 1 %
FEV6-%Pred-Post: 56 %
FEV6-%Pred-Pre: 55 %
FEV6-Post: 1.63 L
FEV6-Pre: 1.6 L
FEV6FVC-%Pred-Post: 104 %
FEV6FVC-%Pred-Pre: 104 %
FVC-%Change-Post: 1 %
FVC-%Pred-Post: 53 %
FVC-%Pred-Pre: 52 %
FVC-Post: 1.63 L
FVC-Pre: 1.6 L
Post FEV1/FVC ratio: 89 %
Post FEV6/FVC ratio: 100 %
Pre FEV1/FVC ratio: 84 %
Pre FEV6/FVC Ratio: 100 %
RV % pred: 82 %
RV: 1.78 L
TLC % pred: 67 %
TLC: 3.39 L

## 2022-01-14 MED ORDER — ALBUTEROL SULFATE (2.5 MG/3ML) 0.083% IN NEBU
2.5000 mg | INHALATION_SOLUTION | Freq: Once | RESPIRATORY_TRACT | Status: AC
  Administered 2022-01-14: 2.5 mg via RESPIRATORY_TRACT

## 2022-01-15 ENCOUNTER — Other Ambulatory Visit: Payer: Self-pay | Admitting: *Deleted

## 2022-01-15 DIAGNOSIS — J984 Other disorders of lung: Secondary | ICD-10-CM

## 2022-01-15 NOTE — Progress Notes (Signed)
amb  

## 2022-01-18 ENCOUNTER — Other Ambulatory Visit: Payer: Self-pay | Admitting: Cardiology

## 2022-01-20 ENCOUNTER — Encounter: Payer: Self-pay | Admitting: Family Medicine

## 2022-01-20 DIAGNOSIS — J984 Other disorders of lung: Secondary | ICD-10-CM | POA: Insufficient documentation

## 2022-01-24 ENCOUNTER — Encounter: Payer: Self-pay | Admitting: Family Medicine

## 2022-01-24 ENCOUNTER — Ambulatory Visit (INDEPENDENT_AMBULATORY_CARE_PROVIDER_SITE_OTHER): Payer: Medicare Other

## 2022-01-24 DIAGNOSIS — I5020 Unspecified systolic (congestive) heart failure: Secondary | ICD-10-CM

## 2022-01-24 DIAGNOSIS — E1142 Type 2 diabetes mellitus with diabetic polyneuropathy: Secondary | ICD-10-CM

## 2022-01-24 DIAGNOSIS — N390 Urinary tract infection, site not specified: Secondary | ICD-10-CM

## 2022-01-24 NOTE — Chronic Care Management (AMB) (Signed)
?Chronic Care Management  ? ?CCM RN Visit Note ? ?01/24/2022 ?Name: Amy Barnes MRN: [id number] DOB: [birth year] ? ?Subjective: ?Amy Barnes is a 71 y.o. year old female who is a primary care patient of [name], MD. The care management team was consulted for assistance with disease management and care coordination needs.   ? ?Engaged with patient by telephone for follow up visit in response to provider referral for case management and/or care coordination services.  ? ?Consent to Services:  ?The patient was given information about Chronic Care Management services, agreed to services, and gave verbal consent prior to initiation of services.  Please see initial visit note for detailed documentation.  ? ?Patient agreed to services and verbal consent obtained.  ? ?Assessment: Review of patient past medical history, allergies, medications, health status, including review of consultants reports, laboratory and other test data, was performed as part of comprehensive evaluation and provision of chronic care management services.  ? ?SDOH (Social Determinants of Health) assessments and interventions performed:   ? ?CCM Care Plan ? ?Allergies  ?Allergen Reactions  ? Gala Murdoch [Fesoterodine] Nausea And Vomiting  ?  Severe reaction - s/p several ER visits then hospitalization ?stress induced cardiomyopathy  ? Cephalexin Hives  ? Hydrocodone Other (See Comments)  ?  Reaction:  Hallucinations   ? Entresto [Sacubitril-Valsartan]   ? Macrodantin [Nitrofurantoin]   ?  Dyspnea, lips peeling  ? Tolterodine Tartrate Nausea And Vomiting  ? Risperidone And Related Other (See Comments)  ?  Reaction:  Made pt excessively sleepy  ? Seroquel [Quetiapine Fumarate] Other (See Comments)  ?  Reaction:  Made pt excessively sleepy  ? Sulfa Antibiotics Rash  ?  Tolerated bactrim course well 06/2020  ? ? ?Outpatient Encounter Medications as of 01/24/2022  ?Medication Sig Note  ? albuterol (PROVENTIL HFA;VENTOLIN HFA) 108 (90 Base) MCG/ACT  inhaler Inhale 2 puffs into the lungs every 6 (six) hours as needed for wheezing or shortness of breath.   ? albuterol (PROVENTIL) (2.5 MG/3ML) 0.083% nebulizer solution Take 3 mLs (2.5 mg total) by nebulization every 6 (six) hours as needed for wheezing or shortness of breath.   ? albuterol (PROVENTIL) (5 MG/ML) 0.5% nebulizer solution Take 0.5 mLs (2.5 mg total) by nebulization every 4 (four) hours as needed for wheezing or shortness of breath.   ? aspirin 81 MG EC tablet Take 81 mg by mouth at bedtime.   ? budesonide-formoterol (SYMBICORT) 160-4.5 MCG/ACT inhaler Inhale 1-2 puffs into the lungs 2 (two) times daily.   ? carvedilol (COREG) 6.25 MG tablet Take 1 tablet (6.25 mg total) by mouth 2 (two) times daily.   ? Cholecalciferol (VITAMIN D) 50 MCG (2000 UT) CAPS Take 1 capsule (2,000 Units total) by mouth daily.   ? dicyclomine (BENTYL) 20 MG tablet Take 1 tablet (20 mg total) by mouth in the morning and at bedtime. (Patient taking differently: Take 20 mg by mouth in the morning and at bedtime. As needed)   ? doxycycline (VIBRA-TABS) 100 MG tablet TAKE ONE TABLET BY MOUTH TWICE A DAY   ? Dulaglutide (TRULICITY) 0.75 MG/0.5ML SOPN Inject 0.75 mg into the skin once a week.   ? ferrous sulfate 325 (65 FE) MG EC tablet Take 1 tablet (325 mg total) by mouth every Monday, Wednesday, and Friday. 02/18/2021: Daughter states patient has not started yet. Daughter will pick up medicine for patient.   ? fluconazole (DIFLUCAN) 200 MG tablet Take 200 mg by mouth daily. Daughter states patient is taking  1 in am 02/18/2021: As needed   ? furosemide (LASIX) 20 MG tablet TAKE TWO TABLETS BY MOUTH DAILY AND AS DIRECTED DISCONTINUE 40MG  TABLET   ? gabapentin (NEURONTIN) 100 MG capsule TAKE 1-2 CAPSULES BY MOUTH AT BEDTIME   ? glimepiride (AMARYL) 1 MG tablet Take 1 tablet (1 mg total) by mouth daily with breakfast.   ? lamoTRIgine (LAMICTAL) 200 MG tablet Take 400 mg by mouth daily.   ? levothyroxine (SYNTHROID) 150 MCG tablet TAKE  ONE TABLET BY MOUTH AT BEDTIME   ? metFORMIN (GLUCOPHAGE) 500 MG tablet Take 1 tablet (500 mg total) by mouth daily with breakfast. (Patient not taking: Reported on 12/23/2021)   ? montelukast (SINGULAIR) 10 MG tablet TAKE ONE TABLET BY MOUTH EACH NIGHT AT BEDTIME   ? Multiple Vitamin (MULTIVITAMIN WITH MINERALS) TABS tablet Take 2 tablets by mouth daily.    ? ondansetron (ZOFRAN ODT) 8 MG disintegrating tablet Take 1 tablet (8 mg total) by mouth 2 (two) times daily as needed for nausea or vomiting.   ? pantoprazole (PROTONIX) 40 MG tablet TAKE ONE TABLET BY MOUTH TWICE A DAY   ? polyethylene glycol (MIRALAX / GLYCOLAX) 17 g packet Take 17 g by mouth 2 (two) times daily as needed for mild constipation.   ? potassium chloride (KLOR-CON) 10 MEQ tablet Take 1 tablet (10 mEq total) by mouth daily. With lasix. May take an additional 10 meq when taking 60 meq of lasix   ? prochlorperazine (COMPAZINE) 10 MG tablet Take 1 tablet (10 mg total) by mouth every 6 (six) hours as needed for nausea or vomiting.   ? promethazine (PHENERGAN) 25 MG suppository Place 1 suppository (25 mg total) rectally every 6 (six) hours as needed for nausea or vomiting.   ? PROZAC 20 MG capsule Take 60 mg by mouth daily.   ? Semaglutide,0.25 or 0.5MG /DOS, (OZEMPIC, 0.25 OR 0.5 MG/DOSE,) 2 MG/1.5ML SOPN Inject 0.25 mg into the skin once a week for 14 days, THEN 0.5 mg once a week.   ? simvastatin (ZOCOR) 40 MG tablet TAKE ONE TABLET BY MOUTH EVERY EVENING   ? sucralfate (CARAFATE) 1 GM/10ML suspension Take 10 mLs (1 g total) by mouth 4 (four) times daily -  with meals and at bedtime. (Patient taking differently: Take 1 g by mouth 4 (four) times daily -  with meals and at bedtime. Pt takes as needed.)   ? ?No facility-administered encounter medications on file as of 01/24/2022.  ? ? ? ?Conditions to be addressed/monitored:CHF, DMII, and Urinary tract infection ? ?Care Plan : RNCM plan of care  ?Updates made by 01/26/2022, RN since 01/24/2022 12:00  AM  ?  ? ?Problem: Chronic disease management education and care coordination needs.   ?Priority: High  ?  ? ?Long-Range Goal: development of plan of care to address chronic disease management and care coordination needs.   ?Start Date: 11/12/2021  ?Expected End Date: 02/26/2022  ?Priority: High  ?Note:   ?Current Barriers:  ?Knowledge Deficits related to plan of care for management of CHF and DMII  ?Care Coordination needs related to symptoms  ?Patient reports having follow up visit with primary care provider on 12/17/21.   Patient states she completed antibiotics for urinary tract infection but continues to have symptoms.  RNCM advised patient to contact her provider office to report ongoing symptoms.  Patient states she has not checked her blood sugars in several days because she misplaced the cord to charge her glucometer.  She reports her last fasting blood sugar was 400.  Patient states her primary provided added Ozempic and amaryl to her medication regime. Per chart review next follow up with primary care provider is 03/21/22.  She reports her primary provider referred her for follow up with pulmonary.  Patient reports today's weight is 365 lbs.     ? ?RNCM Clinical Goal(s):  ?Patient will verbalize understanding of plan for management of CHF and DMII as evidenced by patient self report and/ or notation in chart. ?take all medications exactly as prescribed and will call provider for medication related questions as evidenced by patient self report and/ or notation in chart.     ?attend all scheduled medical appointments:  as evidenced by patient self report and/ or notation in chart.         ?continue to work with Medical illustrator and/or Social Worker to address care management and care coordination needs related to CHF and DMII as evidenced by adherence to CM Team Scheduled appointments     through collaboration with RN Care manager, provider, and care team.  ? ?Interventions: ?1:1 collaboration with primary care  provider regarding development and update of comprehensive plan of care as evidenced by provider attestation and co-signature ?Inter-disciplinary care team collaboration (see longitudinal plan of care) ?E

## 2022-01-24 NOTE — Patient Instructions (Signed)
Visit Information ? ?Thank you for taking time to visit with me today. Please don't hesitate to contact me if I can be of assistance to you before our next scheduled telephone appointment. ? ?Following are the goals we discussed today:  ?Continue to take medications as prescribed   ?Attend all scheduled provider appointments ?Call pharmacy for medication refills 3-7 days in advance of running out of medications ?Call provider office for new concerns or questions  ?call office for weight gain of 3 pounds overnight or  5 pounds in one week.  Continue to weigh daily and record ?Evaluate your symptoms based on your Heart failure action plan. If you are in the yellow zone call your doctor to report symptoms.   Call 911 for severe symptoms ?Continue to follow a low salt diet and adhere to fluid restriction recommendations.   ?Wear oxygen as advised by provider and check oxygen saturations daily ?Continue to check blood sugar at least 2-3 times per day.  Always recheck blood sugar after an elevated  or low reading.  ?Call you doctor to report ongoing urinary tract symptom ?Review education article sent to you in MyChart regarding diabetic diet.  ? ?Our next appointment is by telephone on 02/25/22 at 11:00 am ? ?Please call the care guide team at [phone] if you need to cancel or reschedule your appointment.  ? ?If you are experiencing a Mental Health or Behavioral Health Crisis or need someone to talk to, please call the Suicide and Crisis Lifeline: 988 ?call 1-800-273-TALK (toll free, 24 hour hotline)  ? ?Patient verbalizes understanding of instructions and care plan provided today and agrees to view in MyChart. Active MyChart status confirmed with patient.   ? ?George Ina RN,BSN,CCM ?RN Case Manager ?Amy Barnes  ?[phone] ? ?

## 2022-01-25 ENCOUNTER — Other Ambulatory Visit: Payer: Self-pay | Admitting: Family Medicine

## 2022-01-26 DIAGNOSIS — E1165 Type 2 diabetes mellitus with hyperglycemia: Secondary | ICD-10-CM | POA: Diagnosis not present

## 2022-01-26 DIAGNOSIS — I5020 Unspecified systolic (congestive) heart failure: Secondary | ICD-10-CM | POA: Diagnosis not present

## 2022-01-26 DIAGNOSIS — E1142 Type 2 diabetes mellitus with diabetic polyneuropathy: Secondary | ICD-10-CM | POA: Diagnosis not present

## 2022-01-27 NOTE — Telephone Encounter (Signed)
Refill request Trulicity ?Last refill 12/30/21   2 ML ?Last office visit 12/17/21 ?Upcoming appointment 03/21/22 ?

## 2022-01-27 NOTE — Telephone Encounter (Signed)
ERx next step trulicity 1.5mg  weekly after taking 0.75mg  weekly x 1 month.  ?

## 2022-01-28 DIAGNOSIS — F3181 Bipolar II disorder: Secondary | ICD-10-CM | POA: Diagnosis not present

## 2022-01-28 DIAGNOSIS — Z20822 Contact with and (suspected) exposure to covid-19: Secondary | ICD-10-CM | POA: Diagnosis not present

## 2022-01-29 ENCOUNTER — Other Ambulatory Visit: Payer: Self-pay | Admitting: Family Medicine

## 2022-01-30 ENCOUNTER — Other Ambulatory Visit: Payer: Self-pay | Admitting: *Deleted

## 2022-01-30 DIAGNOSIS — J984 Other disorders of lung: Secondary | ICD-10-CM

## 2022-01-30 DIAGNOSIS — Z20822 Contact with and (suspected) exposure to covid-19: Secondary | ICD-10-CM | POA: Diagnosis not present

## 2022-01-31 DIAGNOSIS — Z20822 Contact with and (suspected) exposure to covid-19: Secondary | ICD-10-CM | POA: Diagnosis not present

## 2022-02-03 DIAGNOSIS — Z20822 Contact with and (suspected) exposure to covid-19: Secondary | ICD-10-CM | POA: Diagnosis not present

## 2022-02-05 DIAGNOSIS — Z20828 Contact with and (suspected) exposure to other viral communicable diseases: Secondary | ICD-10-CM | POA: Diagnosis not present

## 2022-02-07 ENCOUNTER — Other Ambulatory Visit: Payer: Self-pay | Admitting: Internal Medicine

## 2022-02-17 NOTE — Progress Notes (Unsigned)
Synopsis: Referred for restrictive lung disease by [name], MD  Subjective:   PATIENT ID: Amy Barnes GENDER: female DOB: May 22, 1951, MRN: [id number]  No chief complaint on file.  70yF with history of AR, asthma, covid-19 pna, OSA on BiPAP (24/20) followed by Dr. Tresa Endo, CAd, BiPD, DM, GERD, HTN, hypothyroid, chronic systolic heart failure due to NICM referred for chronic hypoxemic respiratory failure which had been presumed due to OHS.    PFT 01/14/22 with moderate restriction, moderately-mildly reduced diffusing capacity. Mildly elevated/normal DL/VA. Very low ERV.  Otherwise pertinent review of systems is negative.  Past Medical History:  Diagnosis Date   Allergic rhinitis    Ambulates with cane    straight   Anemia    Anxiety    Asthma    seasonal   Bipolar affective disorder (HCC)    takes Synthroid meds for Bipolar   CAD (coronary artery disease) 07/2016   by CT scan   Carpal tunnel syndrome    had surgery but occasional still has some issues per patient   Cataract    Centrilobular emphysema (HCC) 07/2016   by CT scan - pt not aware of this   Depression with anxiety    Diabetes mellitus    type 2 - no meds diet controlled   GERD (gastroesophageal reflux disease)    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 - 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 PO   Hyperlipidemia    Hypertension    Hypothyroidism    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 01/16/2018   prevnar 13 - large localized reaction 12/2017   OSA (obstructive sleep apnea)    no longer using cpap, uses a bed that raises and lowers hob   Osteoarthritis    Osteopenia 08/11/2019   DEXA 07/2019 - T -1.1 R femur (osteopenia)   Pneumonia due to COVID-19 virus 05/03/2020   Restless legs    Septic arthritis (HCC) 10/11/2012   Shingles 06/30/2016   Thoracic aortic atherosclerosis (HCC) 11/207   by CT     Family History  Problem Relation Age of Onset   Aneurysm Father 70       brain   Alcohol abuse Father     Cancer Father        possibly   CAD Other        several siblings   Cancer Brother        prostate   Diabetes Brother    Diabetes Sister    Anesthesia problems Neg Hx    Hypotension Neg Hx    Malignant hyperthermia Neg Hx    Pseudochol deficiency Neg Hx    Breast cancer Neg Hx      Past Surgical History:  Procedure Laterality Date   BREAST CYST ASPIRATION     CARPAL TUNNEL RELEASE Right 05/15/2011   CARPAL TUNNEL RELEASE Left 12/24/2017   Procedure: LEFT CARPAL TUNNEL RELEASE;  Surgeon: [name], MD;  Location: [facility];  Service: Orthopedics;  Laterality: Left;   CERVICAL FUSION  2012   C2/3/4   COLONOSCOPY WITH PROPOFOL N/A 12/31/2015   diverticulosis, int hem, o/w normal rpt 10 yrs (Rein)   ESOPHAGOGASTRODUODENOSCOPY (EGD) WITH PROPOFOL N/A 11/12/2019   Procedure: ESOPHAGOGASTRODUODENOSCOPY (EGD) WITH PROPOFOL;  Surgeon: Sherrilyn Rist, MD;  Location: [facility] ENDOSCOPY;  Service: Gastroenterology;  Laterality: N/A;   EYE SURGERY Bilateral    cataract surgery with lens implant   FOOT SURGERY  1982   bone spur   I &  D EXTREMITY  09/06/2012   Budd Palmer, MD; Right;  I&D of right thigh   I & D EXTREMITY Left 07/2013   wound vac - daily doxycycline indefinitely   KNEE ARTHROSCOPY  09/01/2012   Dannielle Huh, MD;  Right   LEFT HEART CATH AND CORONARY ANGIOGRAPHY N/A 11/10/2019   Procedure: LEFT HEART CATH AND CORONARY ANGIOGRAPHY;  Surgeon: Lennette Bihari, MD;  Location: [facility] IN[facility]ASIVE [facility];  Service: Cardiovascular;  Laterality: N/A;   LUMBAR FUSION  01/08/2012   L3-4   LUMBAR FUSION  02/2019   unexpectedly discovered MRSA infection - pus Lovell Sheehan)    LUMBAR LAMINECTOMY/DECOMPRESSION MICRODISCECTOMY N/A 11/13/2016   LAMINOTOMY/LAMINECTOMY LUMBAR FOUR LUMBAR FIVE  WITH RESECTION OF SYNOVIAL CYST;  Surgeon: Tressie Stalker, MD   NECK SURGERY     Herniated disk C2,3,4   NOSE SURGERY     PARTIAL HYSTERECTOMY  1984   for mennorhagia, ovaries remain   REVISION  TOTAL HIP ARTHROPLASTY Left 08/28/2011   TMJ ARTHROPLASTY  1982   TOTAL HIP ARTHROPLASTY Left 2002   TRIGGER FINGER RELEASE Right 05/15/2011   long finger    Social History   Socioeconomic History   Marital status: Married    Spouse name: Not on file   Number of children: 3   Years of education: Not on file   Highest education level: Not on file  Occupational History   Occupation: Unemployed    Employer: UNEMPLOYED  Tobacco Use   Smoking status: Former    Packs/day: 1.50    Years: 40.00    Pack years: 60.00    Types: Cigarettes    Quit date: 04/29/2009    Years since quitting: 12.8   Smokeless tobacco: Never  Vaping Use   Vaping Use: Never used  Substance and Sexual Activity   Alcohol use: Yes    Alcohol/week: 2.0 standard drinks    Types: 2 Shots of liquor per week    Comment: 2 shots/weekly   Drug use: No   Sexual activity: Not Currently    Comment: Hysterectomy  Other Topics Concern   Not on file  Social History Narrative   Married   3 children   Social Determinants of Health   Financial Resource Strain: Not on file  Food Insecurity: Not on file  Transportation Needs: Not on file  Physical Activity: Not on file  Stress: Not on file  Social Connections: Not on file  Intimate Partner Violence: Not on file     Allergies  Allergen Reactions   Toviaz [Fesoterodine] Nausea And Vomiting    Severe reaction - s/p several ER visits then hospitalization ?stress induced cardiomyopathy   Cephalexin Hives   Hydrocodone Other (See Comments)    Reaction:  Hallucinations    Entresto [Sacubitril-Valsartan]    Macrodantin [Nitrofurantoin]     Dyspnea, lips peeling   Tolterodine Tartrate Nausea And Vomiting   Risperidone And Related Other (See Comments)    Reaction:  Made pt excessively sleepy   Seroquel [Quetiapine Fumarate] Other (See Comments)    Reaction:  Made pt excessively sleepy   Sulfa Antibiotics Rash    Tolerated bactrim course well 06/2020      Outpatient Medications Prior to Visit  Medication Sig Dispense Refill   albuterol (PROVENTIL HFA;VENTOLIN HFA) 108 (90 Base) MCG/ACT inhaler Inhale 2 puffs into the lungs every 6 (six) hours as needed for wheezing or shortness of breath. 1 Inhaler 6   albuterol (PROVENTIL) (2.5 MG/3ML) 0.083% nebulizer solution Take 3  mLs (2.5 mg total) by nebulization every 6 (six) hours as needed for wheezing or shortness of breath. 75 mL 12   albuterol (PROVENTIL) (5 MG/ML) 0.5% nebulizer solution Take 0.5 mLs (2.5 mg total) by nebulization every 4 (four) hours as needed for wheezing or shortness of breath. 20 mL 6   aspirin 81 MG EC tablet Take 81 mg by mouth at bedtime.     budesonide-formoterol (SYMBICORT) 160-4.5 MCG/ACT inhaler Inhale 1-2 puffs into the lungs 2 (two) times daily. 1 each 11   carvedilol (COREG) 6.25 MG tablet Take 1 tablet (6.25 mg total) by mouth 2 (two) times daily. 180 tablet 3   Cholecalciferol (VITAMIN D) 50 MCG (2000 UT) CAPS Take 1 capsule (2,000 Units total) by mouth daily. 30 capsule    dicyclomine (BENTYL) 20 MG tablet Take 1 tablet (20 mg total) by mouth in the morning and at bedtime. (Patient taking differently: Take 20 mg by mouth in the morning and at bedtime. As needed) 60 tablet 2   doxycycline (VIBRA-TABS) 100 MG tablet TAKE ONE TABLET BY MOUTH TWICE A DAY 180 tablet 1   Dulaglutide (TRULICITY) 1.5 MG/0.5ML SOPN Inject 1.5 mg into the skin once a week. 2 mL 3   ferrous sulfate 325 (65 FE) MG EC tablet Take 1 tablet (325 mg total) by mouth every Monday, Wednesday, and Friday.     fluconazole (DIFLUCAN) 200 MG tablet Take 200 mg by mouth daily. Daughter states patient is taking 1 in am     furosemide (LASIX) 20 MG tablet TAKE TWO TABLETS BY MOUTH DAILY AND AS DIRECTED ***DISCONTINUE  TABLET*** 180 tablet 6   gabapentin (NEURONTIN) 100 MG capsule TAKE 1-2 CAPSULES BY MOUTH AT BEDTIME 60 capsule 6   glimepiride (AMARYL) 1 MG tablet Take 1 tablet (1 mg total) by mouth daily  with breakfast. 30 tablet 9   lamoTRIgine (LAMICTAL) 200 MG tablet Take 400 mg by mouth daily.     levothyroxine (SYNTHROID) 150 MCG tablet TAKE ONE TABLET BY MOUTH AT BEDTIME 90 tablet 3   metFORMIN (GLUCOPHAGE) 500 MG tablet Take 1 tablet (500 mg total) by mouth daily with breakfast. (Patient not taking: Reported on 12/23/2021) 90 tablet 3   montelukast (SINGULAIR) 10 MG tablet TAKE ONE TABLET BY MOUTH EACH NIGHT AT BEDTIME 90 tablet 3   Multiple Vitamin (MULTIVITAMIN WITH MINERALS) TABS tablet Take 2 tablets by mouth daily.      ondansetron (ZOFRAN ODT) 8 MG disintegrating tablet Take 1 tablet (8 mg total) by mouth 2 (two) times daily as needed for nausea or vomiting. 10 tablet 0   pantoprazole (PROTONIX) 40 MG tablet TAKE ONE TABLET BY MOUTH TWICE A DAY 180 tablet 0   polyethylene glycol (MIRALAX / GLYCOLAX) 17 g packet Take 17 g by mouth 2 (two) times daily as needed for mild constipation. 14 each 0   potassium chloride (KLOR-CON) 10 MEQ tablet Take 1 tablet (10 mEq total) by mouth daily. With lasix. May take an additional 10 meq when taking 60 meq of lasix 40 tablet 6   prochlorperazine (COMPAZINE) 10 MG tablet Take 1 tablet (10 mg total) by mouth every 6 (six) hours as needed for nausea or vomiting. 30 tablet 1   promethazine (PHENERGAN) 25 MG suppository Place 1 suppository (25 mg total) rectally every 6 (six) hours as needed for nausea or vomiting. 12 each 0   PROZAC 20 MG capsule Take 60 mg by mouth daily.     simvastatin (ZOCOR) 40 MG tablet  TAKE ONE TABLET BY MOUTH EVERY EVENING 90 tablet 1   sucralfate (CARAFATE) 1 GM/10ML suspension Take 10 mLs (1 g total) by mouth 4 (four) times daily -  with meals and at bedtime. (Patient taking differently: Take 1 g by mouth 4 (four) times daily -  with meals and at bedtime. Pt takes as needed.) 420 mL 0   No facility-administered medications prior to visit.       Objective:   Physical Exam:  General appearance: 71 y.o., female, NAD,  conversant  Eyes: anicteric sclerae; PERRL, tracking appropriately HENT: NCAT; MMM Neck: Trachea midline; no lymphadenopathy, no JVD Lungs: CTAB, no crackles, no wheeze, with normal respiratory effort CV: RRR, no murmur  Abdomen: Soft, non-tender; non-distended, BS present  Extremities: No peripheral edema, warm Skin: Normal turgor and texture; no rash Psych: Appropriate affect Neuro: Alert and oriented to person and place, no focal deficit     There were no vitals filed for this visit.   on *** LPM *** RA BMI Readings from Last 3 Encounters:  12/23/21 63.85 kg/m  12/17/21 63.19 kg/m  04/08/21 64.37 kg/m   Wt Readings from Last 3 Encounters:  12/23/21 (!) 372 lb (168.7 kg)  12/17/21 (!) 368 lb 2 oz (167 kg)  04/08/21 (!) 375 lb (170.1 kg)     CBC    Component Value Date/Time   WBC 4.4 12/17/2021 0858   RBC 4.48 12/17/2021 0858   HGB 13.1 12/17/2021 0858   HGB 11.8 (L) 09/25/2012 1729   HCT 40.4 12/17/2021 0858   HCT 35.9 09/25/2012 1729   PLT 208.0 12/17/2021 0858   PLT 404 09/25/2012 1729   MCV 90.2 12/17/2021 0858   MCV 82 09/25/2012 1729   MCH 28.5 11/23/2020 1701   MCHC 32.5 12/17/2021 0858   RDW 15.2 12/17/2021 0858   RDW 16.8 (H) 09/25/2012 1729   LYMPHSABS 1.1 12/17/2021 0858   MONOABS 0.5 12/17/2021 0858   EOSABS 0.1 12/17/2021 0858   BASOSABS 0.0 12/17/2021 0858    ***  Chest Imaging: LDCT Chest 09/13/20 reviewed by me with bronchial wall thickening, mosaic attenuation (though not in expiration), stable LNs/small nodule  Pulmonary Functions Testing Results:    Latest Ref Rng & Units 01/14/2022   10:54 AM  PFT Results  FVC-Pre L 1.60    FVC-Predicted Pre % 52    FVC-Post L 1.63    FVC-Predicted Post % 53    Pre FEV1/FVC % % 84    Post FEV1/FCV % % 89    FEV1-Pre L 1.34    FEV1-Predicted Pre % 58    FEV1-Post L 1.45    DLCO uncorrected ml/min/mmHg 11.81    DLCO UNC% % 60    DLVA Predicted % 103    TLC L 3.39    TLC % Predicted % 67     RV % Predicted % 82      FeNO: ***  Pathology: ***  Echocardiogram: ***  Heart Catheterization: ***    Assessment & Plan:    Plan:      Omar Person, MD [facility] 02/17/2022 1:53 PM

## 2022-02-18 ENCOUNTER — Encounter: Payer: Self-pay | Admitting: Student

## 2022-02-18 ENCOUNTER — Other Ambulatory Visit: Payer: Self-pay | Admitting: Family Medicine

## 2022-02-18 ENCOUNTER — Ambulatory Visit (INDEPENDENT_AMBULATORY_CARE_PROVIDER_SITE_OTHER): Payer: Medicare Other | Admitting: Student

## 2022-02-18 VITALS — BP 124/62 | HR 70 | Temp 97.6°F | Ht 64.0 in | Wt 381.0 lb

## 2022-02-18 DIAGNOSIS — J9611 Chronic respiratory failure with hypoxia: Secondary | ICD-10-CM | POA: Diagnosis not present

## 2022-02-18 DIAGNOSIS — G4733 Obstructive sleep apnea (adult) (pediatric): Secondary | ICD-10-CM

## 2022-02-18 DIAGNOSIS — R0609 Other forms of dyspnea: Secondary | ICD-10-CM | POA: Diagnosis not present

## 2022-02-18 MED ORDER — ALBUTEROL SULFATE (2.5 MG/3ML) 0.083% IN NEBU: 2.5000 mg | INHALATION_SOLUTION | Freq: Four times a day (QID) | RESPIRATORY_TRACT | 12 refills | Status: AC | PRN

## 2022-02-18 NOTE — Patient Instructions (Addendum)
-   Take lasix 40 mg twice daily until back down to 370 lb. Then resume daily lasix 40 mg. Can increase to twice daily dosing if gain 3 lb in a day or 5 lb in a week. Take extra potassium when you're taking twice daily lasix - ABG to look for chronic hypoventilation - check BNP at lab - CT Chest to look for any evidence of lung scarring - see you in 6 weeks!

## 2022-02-25 ENCOUNTER — Ambulatory Visit (INDEPENDENT_AMBULATORY_CARE_PROVIDER_SITE_OTHER): Payer: Medicare Other

## 2022-02-25 DIAGNOSIS — E1142 Type 2 diabetes mellitus with diabetic polyneuropathy: Secondary | ICD-10-CM

## 2022-02-25 DIAGNOSIS — I5020 Unspecified systolic (congestive) heart failure: Secondary | ICD-10-CM

## 2022-02-25 DIAGNOSIS — J984 Other disorders of lung: Secondary | ICD-10-CM

## 2022-02-25 NOTE — Chronic Care Management (AMB) (Signed)
Chronic Care Management   CCM RN Visit Note  02/25/2022 Name: Amy Barnes MRN: [id number] DOB: April 16, 1951  Subjective: Amy Barnes is a 71 y.o. year old female who is a primary care patient of [name], MD. The care management team was consulted for assistance with disease management and care coordination needs.    Engaged with patient by telephone for follow up visit in response to provider referral for case management and/or care coordination services.   Consent to Services:  The patient was given information about Chronic Care Management services, agreed to services, and gave verbal consent prior to initiation of services.  Please see initial visit note for detailed documentation.   Patient agreed to services and verbal consent obtained.   Assessment: Review of patient past medical history, allergies, medications, health status, including review of consultants reports, laboratory and other test data, was performed as part of comprehensive evaluation and provision of chronic care management services.   SDOH (Social Determinants of Health) assessments and interventions performed:    CCM Care Plan  Allergies  Allergen Reactions   Toviaz [Fesoterodine] Nausea And Vomiting    Severe reaction - s/p several ER visits then hospitalization ?stress induced cardiomyopathy   Cephalexin Hives   Hydrocodone Other (See Comments)    Reaction:  Hallucinations    Entresto [Sacubitril-Valsartan]    Macrodantin [Nitrofurantoin]     Dyspnea, lips peeling   Tolterodine Tartrate Nausea And Vomiting   Risperidone And Related Other (See Comments)    Reaction:  Made pt excessively sleepy   Seroquel [Quetiapine Fumarate] Other (See Comments)    Reaction:  Made pt excessively sleepy   Sulfa Antibiotics Rash    Tolerated bactrim course well 06/2020    Outpatient Encounter Medications as of 02/25/2022  Medication Sig Note   albuterol (PROVENTIL HFA;VENTOLIN HFA) 108 (90 Base) MCG/ACT  inhaler Inhale 2 puffs into the lungs every 6 (six) hours as needed for wheezing or shortness of breath.    albuterol (PROVENTIL) (2.5 MG/3ML) 0.083% nebulizer solution Take 3 mLs (2.5 mg total) by nebulization every 6 (six) hours as needed for wheezing or shortness of breath.    albuterol (PROVENTIL) (5 MG/ML) 0.5% nebulizer solution Take 0.5 mLs (2.5 mg total) by nebulization every 4 (four) hours as needed for wheezing or shortness of breath.    aspirin 81 MG EC tablet Take 81 mg by mouth at bedtime.    budesonide-formoterol (SYMBICORT) 160-4.5 MCG/ACT inhaler Inhale 2 puffs into the lungs 2 (two) times daily.    carvedilol (COREG) 6.25 MG tablet Take 1 tablet (6.25 mg total) by mouth 2 (two) times daily.    Cholecalciferol (VITAMIN D) 50 MCG (2000 UT) CAPS Take 1 capsule (2,000 Units total) by mouth daily.    dicyclomine (BENTYL) 20 MG tablet Take 1 tablet (20 mg total) by mouth in the morning and at bedtime. (Patient taking differently: Take 20 mg by mouth in the morning and at bedtime. As needed)    doxycycline (VIBRA-TABS) 100 MG tablet TAKE ONE TABLET BY MOUTH TWICE A DAY    ferrous sulfate 325 (65 FE) MG EC tablet Take 1 tablet (325 mg total) by mouth every Monday, Wednesday, and Friday. 02/18/2021: Daughter states patient has not started yet. Daughter will pick up medicine for patient.    fluconazole (DIFLUCAN) 200 MG tablet Take 200 mg by mouth daily. Daughter states patient is taking 1 in am 02/18/2021: As needed    furosemide (LASIX) 20 MG tablet TAKE TWO TABLETS  BY MOUTH DAILY AND AS DIRECTED DISCONTINUE 40MG  TABLET    gabapentin (NEURONTIN) 100 MG capsule TAKE 1-2 CAPSULES BY MOUTH AT BEDTIME    glimepiride (AMARYL) 1 MG tablet Take 1 tablet (1 mg total) by mouth daily with breakfast.    lamoTRIgine (LAMICTAL) 200 MG tablet Take 400 mg by mouth daily.    levothyroxine (SYNTHROID) 150 MCG tablet TAKE ONE TABLET BY MOUTH AT BEDTIME    montelukast (SINGULAIR) 10 MG tablet TAKE ONE TABLET BY  MOUTH EACH NIGHT AT BEDTIME    Multiple Vitamin (MULTIVITAMIN WITH MINERALS) TABS tablet Take 2 tablets by mouth daily.     ondansetron (ZOFRAN ODT) 8 MG disintegrating tablet Take 1 tablet (8 mg total) by mouth 2 (two) times daily as needed for nausea or vomiting.    OZEMPIC, 0.25 OR 0.5 MG/DOSE, 2 MG/3ML SOPN Inject 0.5 mg into the skin every 7 (seven) days.    pantoprazole (PROTONIX) 40 MG tablet TAKE ONE TABLET BY MOUTH TWICE A DAY    polyethylene glycol (MIRALAX / GLYCOLAX) 17 g packet Take 17 g by mouth 2 (two) times daily as needed for mild constipation.    potassium chloride (KLOR-CON) 10 MEQ tablet Take 1 tablet (10 mEq total) by mouth daily. With lasix. May take an additional 10 meq when taking 60 meq of lasix    prochlorperazine (COMPAZINE) 10 MG tablet Take 1 tablet (10 mg total) by mouth every 6 (six) hours as needed for nausea or vomiting.    promethazine (PHENERGAN) 25 MG suppository Place 1 suppository (25 mg total) rectally every 6 (six) hours as needed for nausea or vomiting.    PROZAC 20 MG capsule Take 60 mg by mouth daily.    simvastatin (ZOCOR) 40 MG tablet TAKE ONE TABLET BY MOUTH EVERY EVENING    sucralfate (CARAFATE) 1 GM/10ML suspension Take 10 mLs (1 g total) by mouth 4 (four) times daily -  with meals and at bedtime. (Patient taking differently: Take 1 g by mouth 4 (four) times daily -  with meals and at bedtime. Pt takes as needed.)       Conditions to be addressed/monitored:CHF, DMII, and Restrictive lung disease  Care Plan : [facility] plan of care  Updates made by [facility], RN since 02/25/2022 12:00 AM     Problem: Chronic disease management education and care coordination needs.   Priority: High     Long-Range Goal: development of plan of care to address chronic disease management and care coordination needs.   Start Date: 11/12/2021  Expected End Date: 04/28/2022  Priority: High  Note:   Current Barriers:  Knowledge Deficits related to plan of care for  management of CHF and DMII  Care Coordination needs related to symptoms  Patient reports having office visit with pulmonologist on 02/18/22.  Patient reports today's weight is 374 lbs. She states she weighs every 2 days or so. She states she does not like to weight everyday and get disappointed because her weight fluctuates quite a bit.  Patient states she is taking her lasix as prescribed.  She states she continues to have a cough and reports her oxygen saturation on room air recently dropped to 81.  Patient states her oxygen level ranges from 94-95% while on oxygen. She reports she is wearing her oxygen at 3 L.  Patient states her blood sugars are ranging from 130-180.  Continues to take Ozempic and Amaryl.        RNCM Clinical Goal(s):  Patient will  verbalize understanding of plan for management of CHF and DMII as evidenced by patient self report and/ or notation in chart. take all medications exactly as prescribed and will call provider for medication related questions as evidenced by patient self report and/ or notation in chart.     attend all scheduled medical appointments:  as evidenced by patient self report and/ or notation in chart.         continue to work with Medical illustrator and/or Social Worker to address care management and care coordination needs related to CHF and DMII as evidenced by adherence to CM Team Scheduled appointments     through collaboration with Medical illustrator, provider, and care team.   Interventions: 1:1 collaboration with primary care provider regarding development and update of comprehensive plan of care as evidenced by provider attestation and co-signature Inter-disciplinary care team collaboration (see longitudinal plan of care) Evaluation of current treatment plan related to  self management and patient's adherence to plan as established by provider   Heart Failure Interventions:  (Status:  Goal on track:  Yes.) Long Term Goal Discussed importance of daily  weight and advised patient to weigh and record daily Reviewed heart failure signs/ symptoms Discussed the importance of keeping all appointments with provider Reviewed heart failure action plan and zones Reviewed medications with patient and discussed importance of adherence.   Diabetes Interventions:  (Status:  Goal on track:  Yes.) Long Term Goal Assessed patient's understanding of A1c goal:  7.9 Reviewed medications with patient and discussed importance of medication adherence Discussed plans with patient for ongoing care management follow up and provided patient with direct contact information for care management team Reviewed scheduled/upcoming provider appointments Confirmed with patient appointment made with with primary care provider office to discuss elevated blood sugars.  Discussed risk of elevated blood sugars and importance of diet modification, exercise, lifestyle changes to manage blood sugars Provided patient written education on diabetic diet Advised patient the importance of checking her blood sugars daily   Lab Results  Component Value Date   HGBA1C 13.7 (A) 12/17/2021    Restrictive Lung disease:  (Status: New Goal) Long Term Goal Reviewed medications with patient and discussed importance of medication adherence Discussed plans with patient for ongoing care management follow up and provided patient with direct contact information for care management team  Reviewed scheduled / upcoming provider appointments.  Reviewed pulmonologist instructions/ advisement from 02/18/22 visit.  Advised patient to follow up with her daughter who manages her medications.  Advised patient that she or her daughter can follow up with RNCM if they have questions or call pulmonologist office.  Advised patient to weigh daily and take lasix as per pulmonologist instructions  Patient Goals/Self-Care Activities: Continue to take medications as prescribed   Attend all scheduled provider  appointments Call pharmacy for medication refills 3-7 days in advance of running out of medications Call provider office for new concerns or questions  call office for weight gain of 3 pounds overnight or  5 pounds in one week.  Continue to weigh daily and record Evaluate your symptoms based on your Heart failure action plan. If you are in the yellow zone call your doctor to report symptoms.   Call 911 for severe symptoms Continue to follow a low salt diet and adhere to fluid restriction recommendations.   Wear oxygen as advised by provider and check oxygen saturations daily Continue to check blood sugar at least 2-3 times per day.  Always recheck blood sugar  after an elevated  or low reading.  Review pulmonologist instructions regarding your lasix with your daughter. If you or your daughter have questions please call your RN case manager or the pulmonologist office.      PLAN: RNCM will follow up with patient within 45 days  George Ina RN,BSN,CCM RN Case Manager [facility]  [phone]

## 2022-02-25 NOTE — Patient Instructions (Signed)
Visit Information  Thank you for taking time to visit with me today. Please don't hesitate to contact me if I can be of assistance to you before our next scheduled telephone appointment.  Following are the goals we discussed today:  Continue to take medications as prescribed   Attend all scheduled provider appointments Call pharmacy for medication refills 3-7 days in advance of running out of medications Call provider office for new concerns or questions  call office for weight gain of 3 pounds overnight or  5 pounds in one week.  Continue to weigh daily and record Evaluate your symptoms based on your Heart failure action plan. If you are in the yellow zone call your doctor to report symptoms.   Call 911 for severe symptoms Continue to follow a low salt diet and adhere to fluid restriction recommendations.   Wear oxygen as advised by provider and check oxygen saturations daily Continue to check blood sugar at least 2-3 times per day.  Always recheck blood sugar after an elevated  or low reading.  Review pulmonologist instructions regarding your lasix with your daughter. If you or your daughter have questions please call your RN case manager or the pulmonologist office.   Our next appointment is by telephone on 03/27/22 at 11:00 am  Please call the care guide team at [phone] if you need to cancel or reschedule your appointment.   If you are experiencing a Mental Health or Behavioral Health Crisis or need someone to talk to, please call the Suicide and Crisis Lifeline: 988 call 1-800-273-TALK (toll free, 24 hour hotline)   Patient verbalizes understanding of instructions and care plan provided today and agrees to view in MyChart. Active MyChart status and patient understanding of how to access instructions and care plan via MyChart confirmed with patient.     George Ina RN,BSN,CCM RN Case Manager [facility] [place]  [phone]

## 2022-02-26 DIAGNOSIS — J984 Other disorders of lung: Secondary | ICD-10-CM

## 2022-02-26 DIAGNOSIS — Z7984 Long term (current) use of oral hypoglycemic drugs: Secondary | ICD-10-CM | POA: Diagnosis not present

## 2022-02-26 DIAGNOSIS — Z87891 Personal history of nicotine dependence: Secondary | ICD-10-CM

## 2022-02-26 DIAGNOSIS — I509 Heart failure, unspecified: Secondary | ICD-10-CM

## 2022-02-26 DIAGNOSIS — E1159 Type 2 diabetes mellitus with other circulatory complications: Secondary | ICD-10-CM | POA: Diagnosis not present

## 2022-02-28 ENCOUNTER — Ambulatory Visit
Admission: RE | Admit: 2022-02-28 | Discharge: 2022-02-28 | Disposition: A | Discharge status: Home / Self Care | Payer: Medicare Other | Source: Ambulatory Visit | Attending: Student | Admitting: Student

## 2022-02-28 DIAGNOSIS — I2510 Atherosclerotic heart disease of native coronary artery without angina pectoris: Secondary | ICD-10-CM | POA: Diagnosis not present

## 2022-02-28 DIAGNOSIS — R0609 Other forms of dyspnea: Secondary | ICD-10-CM | POA: Insufficient documentation

## 2022-02-28 DIAGNOSIS — J984 Other disorders of lung: Secondary | ICD-10-CM | POA: Diagnosis not present

## 2022-02-28 DIAGNOSIS — I517 Cardiomegaly: Secondary | ICD-10-CM | POA: Diagnosis not present

## 2022-02-28 DIAGNOSIS — I700 Atherosclerosis of aorta: Secondary | ICD-10-CM | POA: Diagnosis not present

## 2022-03-08 ENCOUNTER — Other Ambulatory Visit: Payer: Self-pay | Admitting: Family Medicine

## 2022-03-08 DIAGNOSIS — E785 Hyperlipidemia, unspecified: Secondary | ICD-10-CM

## 2022-03-08 DIAGNOSIS — E559 Vitamin D deficiency, unspecified: Secondary | ICD-10-CM

## 2022-03-08 DIAGNOSIS — E039 Hypothyroidism, unspecified: Secondary | ICD-10-CM

## 2022-03-08 DIAGNOSIS — E1165 Type 2 diabetes mellitus with hyperglycemia: Secondary | ICD-10-CM

## 2022-03-08 DIAGNOSIS — D649 Anemia, unspecified: Secondary | ICD-10-CM

## 2022-03-08 NOTE — Addendum Note (Signed)
Addended by: [name] on: 03/08/2022 12:07 PM   Modules accepted: Orders

## 2022-03-12 ENCOUNTER — Other Ambulatory Visit (INDEPENDENT_AMBULATORY_CARE_PROVIDER_SITE_OTHER): Payer: Medicare Other

## 2022-03-12 DIAGNOSIS — E1169 Type 2 diabetes mellitus with other specified complication: Secondary | ICD-10-CM

## 2022-03-12 DIAGNOSIS — R0609 Other forms of dyspnea: Secondary | ICD-10-CM

## 2022-03-12 DIAGNOSIS — E1165 Type 2 diabetes mellitus with hyperglycemia: Secondary | ICD-10-CM

## 2022-03-12 DIAGNOSIS — D649 Anemia, unspecified: Secondary | ICD-10-CM

## 2022-03-12 DIAGNOSIS — E785 Hyperlipidemia, unspecified: Secondary | ICD-10-CM

## 2022-03-12 DIAGNOSIS — E559 Vitamin D deficiency, unspecified: Secondary | ICD-10-CM | POA: Diagnosis not present

## 2022-03-12 DIAGNOSIS — E1142 Type 2 diabetes mellitus with diabetic polyneuropathy: Secondary | ICD-10-CM

## 2022-03-12 LAB — BASIC METABOLIC PANEL
BUN: 20 mg/dL (ref 6–23)
CO2: 31 mEq/L (ref 19–32)
Calcium: 9.6 mg/dL (ref 8.4–10.5)
Chloride: 100 mEq/L (ref 96–112)
Creatinine, Ser: 0.76 mg/dL (ref 0.40–1.20)
GFR: 79.76 mL/min (ref >60.00)
Glucose, Bld: 166 mg/dL — ABNORMAL HIGH (ref 70–99)
Potassium: 4.5 mEq/L (ref 3.5–5.1)
Sodium: 140 mEq/L (ref 135–145)

## 2022-03-12 LAB — CBC WITH DIFFERENTIAL/PLATELET
Basophils Absolute: 0 10*3/uL (ref 0.0–0.1)
Basophils Relative: 0.4 % (ref 0.0–3.0)
Eosinophils Absolute: 0.2 10*3/uL (ref 0.0–0.7)
Eosinophils Relative: 3.3 % (ref 0.0–5.0)
HCT: 35.7 % — ABNORMAL LOW (ref 36.0–46.0)
Hemoglobin: 11.7 g/dL — ABNORMAL LOW (ref 12.0–15.0)
Lymphocytes Relative: 26.6 % (ref 12.0–46.0)
Lymphs Abs: 1.7 10*3/uL (ref 0.7–4.0)
MCHC: 32.9 g/dL (ref 30.0–36.0)
MCV: 90.5 fl (ref 78.0–100.0)
Monocytes Absolute: 0.7 10*3/uL (ref 0.1–1.0)
Monocytes Relative: 10.6 % (ref 3.0–12.0)
Neutro Abs: 3.7 10*3/uL (ref 1.4–7.7)
Neutrophils Relative %: 59.1 % (ref 43.0–77.0)
Platelets: 237 10*3/uL (ref 150.0–400.0)
RBC: 3.94 Mil/uL (ref 3.87–5.11)
RDW: 15.3 % (ref 11.5–15.5)
WBC: 6.3 10*3/uL (ref 4.0–10.5)

## 2022-03-12 LAB — LIPID PANEL
Cholesterol: 118 mg/dL (ref 0–200)
HDL: 51.7 mg/dL (ref >39.00)
LDL Cholesterol: 48 mg/dL (ref 0–99)
NonHDL: 66.73
Total CHOL/HDL Ratio: 2
Triglycerides: 94 mg/dL (ref 0.0–149.0)
VLDL: 18.8 mg/dL (ref 0.0–40.0)

## 2022-03-12 LAB — HEMOGLOBIN A1C: Hgb A1c MFr Bld: 7.4 % — ABNORMAL HIGH (ref 4.6–6.5)

## 2022-03-12 LAB — IBC PANEL
Iron: 81 ug/dL (ref 42–145)
Saturation Ratios: 23.3 % (ref 20.0–50.0)
TIBC: 347.2 ug/dL (ref 250.0–450.0)
Transferrin: 248 mg/dL (ref 212.0–360.0)

## 2022-03-12 LAB — FERRITIN: Ferritin: 91.5 ng/mL (ref 10.0–291.0)

## 2022-03-12 LAB — VITAMIN D 25 HYDROXY (VIT D DEFICIENCY, FRACTURES): VITD: 28.52 ng/mL — ABNORMAL LOW (ref 30.00–100.00)

## 2022-03-12 LAB — BRAIN NATRIURETIC PEPTIDE: Pro B Natriuretic peptide (BNP): 95 pg/mL (ref 0.0–100.0)

## 2022-03-12 NOTE — Addendum Note (Signed)
Addended by: Ilda Foil on: 03/12/2022 03:40 PM   Modules accepted: Orders

## 2022-03-14 LAB — MICROALBUMIN / CREATININE URINE RATIO
Creatinine,U: 70.4 mg/dL
Microalb Creat Ratio: 3.6 mg/g (ref 0.0–30.0)
Microalb, Ur: 2.6 mg/dL — ABNORMAL HIGH (ref 0.0–1.9)

## 2022-03-16 ENCOUNTER — Other Ambulatory Visit: Payer: Self-pay | Admitting: Family Medicine

## 2022-03-21 ENCOUNTER — Encounter: Payer: Self-pay | Admitting: Family Medicine

## 2022-03-21 ENCOUNTER — Ambulatory Visit (INDEPENDENT_AMBULATORY_CARE_PROVIDER_SITE_OTHER): Payer: Medicare Other | Admitting: Family Medicine

## 2022-03-21 VITALS — BP 134/70 | HR 76 | Temp 97.7°F | Ht 62.5 in | Wt 377.2 lb

## 2022-03-21 DIAGNOSIS — E1142 Type 2 diabetes mellitus with diabetic polyneuropathy: Secondary | ICD-10-CM | POA: Diagnosis not present

## 2022-03-21 DIAGNOSIS — G894 Chronic pain syndrome: Secondary | ICD-10-CM | POA: Diagnosis not present

## 2022-03-21 DIAGNOSIS — I428 Other cardiomyopathies: Secondary | ICD-10-CM

## 2022-03-21 DIAGNOSIS — K219 Gastro-esophageal reflux disease without esophagitis: Secondary | ICD-10-CM

## 2022-03-21 DIAGNOSIS — D649 Anemia, unspecified: Secondary | ICD-10-CM | POA: Diagnosis not present

## 2022-03-21 DIAGNOSIS — E039 Hypothyroidism, unspecified: Secondary | ICD-10-CM

## 2022-03-21 DIAGNOSIS — M85851 Other specified disorders of bone density and structure, right thigh: Secondary | ICD-10-CM

## 2022-03-21 DIAGNOSIS — Z0000 Encounter for general adult medical examination without abnormal findings: Secondary | ICD-10-CM | POA: Diagnosis not present

## 2022-03-21 DIAGNOSIS — Z87891 Personal history of nicotine dependence: Secondary | ICD-10-CM

## 2022-03-21 DIAGNOSIS — M150 Primary generalized (osteo)arthritis: Secondary | ICD-10-CM

## 2022-03-21 DIAGNOSIS — E1169 Type 2 diabetes mellitus with other specified complication: Secondary | ICD-10-CM

## 2022-03-21 DIAGNOSIS — E662 Morbid (severe) obesity with alveolar hypoventilation: Secondary | ICD-10-CM

## 2022-03-21 DIAGNOSIS — J432 Centrilobular emphysema: Secondary | ICD-10-CM

## 2022-03-21 DIAGNOSIS — G4733 Obstructive sleep apnea (adult) (pediatric): Secondary | ICD-10-CM

## 2022-03-21 DIAGNOSIS — I5020 Unspecified systolic (congestive) heart failure: Secondary | ICD-10-CM

## 2022-03-21 DIAGNOSIS — Z7189 Other specified counseling: Secondary | ICD-10-CM

## 2022-03-21 DIAGNOSIS — I10 Essential (primary) hypertension: Secondary | ICD-10-CM | POA: Diagnosis not present

## 2022-03-21 DIAGNOSIS — J9611 Chronic respiratory failure with hypoxia: Secondary | ICD-10-CM

## 2022-03-21 DIAGNOSIS — I700 Atherosclerosis of aorta: Secondary | ICD-10-CM

## 2022-03-21 DIAGNOSIS — R300 Dysuria: Secondary | ICD-10-CM

## 2022-03-21 DIAGNOSIS — E785 Hyperlipidemia, unspecified: Secondary | ICD-10-CM

## 2022-03-21 DIAGNOSIS — F3170 Bipolar disorder, currently in remission, most recent episode unspecified: Secondary | ICD-10-CM

## 2022-03-21 DIAGNOSIS — J4520 Mild intermittent asthma, uncomplicated: Secondary | ICD-10-CM | POA: Diagnosis not present

## 2022-03-21 DIAGNOSIS — M159 Polyosteoarthritis, unspecified: Secondary | ICD-10-CM | POA: Diagnosis not present

## 2022-03-21 DIAGNOSIS — T847XXS Infection and inflammatory reaction due to other internal orthopedic prosthetic devices, implants and grafts, sequela: Secondary | ICD-10-CM

## 2022-03-21 DIAGNOSIS — E559 Vitamin D deficiency, unspecified: Secondary | ICD-10-CM

## 2022-03-21 DIAGNOSIS — Z9981 Dependence on supplemental oxygen: Secondary | ICD-10-CM

## 2022-03-21 DIAGNOSIS — N390 Urinary tract infection, site not specified: Secondary | ICD-10-CM

## 2022-03-21 DIAGNOSIS — N39498 Other specified urinary incontinence: Secondary | ICD-10-CM

## 2022-03-21 DIAGNOSIS — Z6841 Body Mass Index (BMI) 40.0 and over, adult: Secondary | ICD-10-CM

## 2022-03-21 LAB — POC URINALSYSI DIPSTICK (AUTOMATED)
Bilirubin, UA: NEGATIVE
Blood, UA: NEGATIVE
Glucose, UA: NEGATIVE
Ketones, UA: NEGATIVE
Nitrite, UA: POSITIVE
Protein, UA: NEGATIVE
Spec Grav, UA: 1.015 (ref 1.010–1.025)
Urobilinogen, UA: 1 E.U./dL
pH, UA: 6.5 (ref 5.0–8.0)

## 2022-03-21 MED ORDER — OZEMPIC (0.25 OR 0.5 MG/DOSE) 2 MG/3ML ~~LOC~~ SOPN: 0.5000 mg | PEN_INJECTOR | SUBCUTANEOUS | 3 refills | Status: DC

## 2022-03-21 NOTE — Progress Notes (Signed)
Patient ID: Amy Barnes, female    DOB: 05-12-1951, 71 y.o.   MRN: [id number]  This visit was conducted in person.  BP 134/70   Pulse 76   Temp 97.7 F (36.5 C) (Temporal)   Ht 5' 2.5" (1.588 m)   Wt (!) 377 lb 4 oz (171.1 kg)   LMP  (LMP Unknown)   SpO2 92% Comment: 2 L  BMI 67.90 kg/m    CC: AMW Subjective:   HPI: Amy Barnes is a 71 y.o. female presenting on 03/21/2022 for Medicare Wellness (Pt brought in home wrist BP cuff to compare. Reading in office today, 184/101. Pt accompanied by daughter, [name]. )   Did not see health advisor.  Hearing Screening   500Hz  1000Hz  2000Hz  4000Hz   Right ear 40 40 25 0  Left ear 25 20 20  40  Comments: Pt states she has decreased hearing.  Vision Screening - Comments:: Last eye exam, 12/2021.  Flowsheet Row Office Visit from 03/21/2022 in [facility] [place]  PHQ-2 Total Score 2     Declines audiology referral.      03/21/2022    3:31 PM 12/03/2021    4:18 PM 06/04/2021    1:28 PM 02/08/2021    3:00 PM 11/23/2020    4:31 PM  Fall Risk   Falls in the past year? 0 0 0 1 1  Number falls in past yr:   0 1 1  Injury with Fall?   0 1 1  Comment    Head injury     H/o HFrEF followed by Dr Antoine Poche on carvedilol. EF improved on latest echocardiogram 01/2020. Continues lasix 20mg  twice daily with potassium, extra PRN.    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 Planning ABG as well as HRCT chest.    MRSA lumbar HW infection - followed by ID Drue Second) - planning to continue doxy 100mg  bid for extended period, drops dose when she has yeast infection, and uses fluconazole PRN yeast infections. Now seeing approximately once a year.  DM - last visit we started ozempic 0.5mg  weekly - she also continues glimepiride 1mg  daily.  Lab Results  Component Value Date   HGBA1C 7.4 (H) 03/12/2022   A few weeks of dysuria, suprapubic discomfort with worse smelling urine. No increased frequency or  hematuria, flank pain or nausea/vomiting.    Preventative: Pt desires to discontinue preventative healthcare screenings due to comorbidities.  COLONOSCOPY WITH PROPOFOL 12/31/2015; diverticulosis, int hem, o/w normal rpt 10 yrs ([facility])  Breast cancer screening -07/2019 BiRads1 - due, declines repeat at this time. No lumps Well woman exam - h/o partial hysterectomy for menorrhagia, ovaries remain. No pelvic pain/pressure or bleeding.  DEXA 07/2019 - T -1.1 R femur (osteopenia). Discussed calcium in diet. Discussed vit D.  Lung cancer screening - ex smoker, quit 2010. ~40 PY hx. previously underwent LRCT scans - last 08/2020. most recently HRCT of lungs done 02/2022 Flu shot - yearly  COVID vaccine - declined Td - 2008  Pneumovax 2013, prevnar-13 2019 (large local reaction after this). Pneumovax 12/2019. Zostavax - 2015  Shingrix - discussed.  Advanced directive: scanned 05/2017. Husband Melvyn Neth is HCPOA. Does not want CPR, intubation. Confirmed DNR - goldenrod form provided. Does not want prolonged life support if terminal condition. New packet provided as pt desires to update for herself and husband.  Seat belt use discussed Sunscreen use discussed. No changing moles on skin.  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 Interim medical history since our last visit reviewed. Allergies and medications reviewed and updated.    Per HPI unless specifically indicated in ROS section below Review of Systems  Objective:  BP 134/70   Pulse 76   Temp 97.7  F (36.5 C) (Temporal)   Ht 5' 2.5" (1.588 m)   Wt (!) 377 lb 4 oz (171.1 kg)   LMP  (LMP Unknown)   SpO2 92% Comment: 2 L  BMI 67.90 kg/m   Wt Readings from Last 3 Encounters:  03/21/22 (!) 377 lb 4 oz (171.1 kg)  02/18/22 (!) 381 lb (172.8 kg)  12/23/21 (!) 372 lb (168.7 kg)      Physical Exam Vitals and nursing note reviewed.  Constitutional:      Appearance: Normal appearance. She is obese. She is not ill-appearing.  HENT:     Head: Normocephalic and atraumatic.     Right Ear: Tympanic membrane, ear canal and external ear normal. There is no impacted cerumen.     Left Ear: Tympanic membrane, ear canal and external ear normal. There is no impacted cerumen.  Eyes:     General:        Right eye: No discharge.        Left eye: No discharge.     Extraocular Movements: Extraocular movements intact.     Conjunctiva/sclera: Conjunctivae normal.     Pupils: Pupils are equal, round, and reactive to light.  Neck:     Thyroid: No thyroid mass or thyromegaly.  Cardiovascular:     Rate and Rhythm: Normal rate and regular rhythm.     Pulses: Normal pulses.     Heart sounds: Normal heart sounds. No murmur heard. Pulmonary:     Effort: Pulmonary effort is normal. No respiratory distress.     Breath sounds: Normal breath sounds. No wheezing, rhonchi or rales.  Abdominal:     General: Bowel sounds are normal. There is no distension.     Palpations: Abdomen is soft. There is no mass.     Tenderness: There is no abdominal tenderness. There is no guarding or rebound.     Hernia: No hernia is present.  Musculoskeletal:     Cervical back: Normal range of motion and neck supple. No rigidity.     Right lower leg: No edema.     Left lower leg: No edema.  Lymphadenopathy:     Cervical: No cervical adenopathy.  Skin:    General: Skin is warm and dry.     Findings: No rash.  Neurological:     General: No focal deficit present.     Mental Status: She is alert. Mental status is at baseline.      Comments:  Recall 3/3 Calculation 3/5 DLORW  Psychiatric:        Mood and Affect: Mood normal.        Behavior: Behavior normal.       Results for orders placed or performed in visit  on 03/21/22  Urine Culture   Specimen: Urine  Result Value Ref Range   MICRO NUMBER: [id number]    SPECIMEN QUALITY: Adequate    Sample Source URINE    STATUS: FINAL    ISOLATE 1: ESBL Klebsiella pneumoniae (A)       Susceptibility   Esbl klebsiella pneumoniae - URINE CULTURE, REFLEX    AMOX/CLAVULANIC 4 Sensitive     AMPICILLIN* >=32 Resistant      * Extended spectrum beta-lactamase (ESBL) producing organisms demonstrate decreased activity with penicillins, cephalosporins and aztreonam.     AMPICILLIN/SULBACTAM >=32 Resistant     CEFAZOLIN* >=64 Resistant      * Extended spectrum beta-lactamase (ESBL) producing organisms demonstrate decreased activity with penicillins, cephalosporins and aztreonam. For uncomplicated UTI caused by E. coli, K. pneumoniae or P. mirabilis: Cefazolin is susceptible if MIC <32 mcg/mL and predicts susceptible to the oral agents cefaclor, cefdinir, cefpodoxime, cefprozil, cefuroxime, cephalexin and loracarbef.     CEFTAZIDIME 16 Resistant     CEFEPIME <=1 Sensitive     CEFTRIAXONE 32 Resistant     CIPROFLOXACIN 1 Resistant     LEVOFLOXACIN 4 Resistant     GENTAMICIN <=1 Sensitive     IMIPENEM <=0.25 Sensitive     NITROFURANTOIN 32 Sensitive     PIP/TAZO 16 Sensitive     TOBRAMYCIN <=1 Sensitive     TRIMETH/SULFA* >=320 Resistant      * Extended spectrum beta-lactamase (ESBL) producing organisms demonstrate decreased activity with penicillins, cephalosporins and aztreonam. For uncomplicated UTI caused by E. coli, K. pneumoniae or P. mirabilis: Cefazolin is susceptible if MIC <32 mcg/mL and predicts susceptible to the oral agents cefaclor, cefdinir, cefpodoxime, cefprozil, cefuroxime, cephalexin and loracarbef. Legend: S = Susceptible  I =  Intermediate R = Resistant  NS = Not susceptible * = Not tested  NR = Not reported **NN = See antimicrobic comments   POCT Urinalysis Dipstick (Automated)  Result Value Ref Range   Color, UA yellow    Clarity, UA cloudy    Glucose, UA Negative Negative   Bilirubin, UA negative    Ketones, UA negative    Spec Grav, UA 1.015 1.010 - 1.025   Blood, UA negative    pH, UA 6.5 5.0 - 8.0   Protein, UA Negative Negative   Urobilinogen, UA 1.0 0.2 or 1.0 E.U./dL   Nitrite, UA positive    Leukocytes, UA Small (1+) (A) Negative   Lab Results  Component Value Date   TSH 2.37 12/17/2021    Lab Results  Component Value Date   ALT 22 12/17/2021   AST 16 12/17/2021   ALKPHOS 70 12/17/2021   BILITOT 0.6 12/17/2021    Assessment & Plan:   Problem List Items Addressed This Visit     Chronic pain syndrome (Chronic)   Advanced care planning/counseling discussion (Chronic)    Advanced directive: scanned 05/2017. Husband Melvyn Neth is HCPOA. Does not want CPR, intubation. Confirmed DNR - goldenrod form provided. Does not want prolonged life support if terminal condition. New packet provided as pt desires to update for herself and husband.       Osteoarthritis involving multiple joints (Chronic)   Medicare annual wellness visit, subsequent - Primary (Chronic)    I have personally reviewed the Medicare Annual Wellness questionnaire and have noted 1. The patient's medical and social history 2. Their use of alcohol, tobacco or illicit drugs 3. Their current medications and supplements 4. The patient's functional ability including ADL's, fall risks, home safety  risks and hearing or visual impairment. Cognitive function has been assessed and addressed as indicated.  5. Diet and physical activity 6. Evidence for depression or mood disorders The patients weight, height, BMI have been recorded in the chart. I have made referrals, counseling and provided education to the patient based on review of the  above and I have provided the pt with a written personalized care plan for preventive services. Provider list updated.. See scanned questionairre as needed for further documentation. Reviewed preventative protocols and updated unless pt declined.       Hypothyroidism    Recent TSH normal - continue levothyroxine replacement.       Type 2 diabetes mellitus with peripheral neuropathy (HCC)    Chronic, improved A1c - continue current regimen including amaryl and ozempic 0.5mg  weekly.       Relevant Medications   OZEMPIC, 0.25 OR 0.5 MG/DOSE, 2 MG/3ML SOPN   Hyperlipidemia associated with type 2 diabetes mellitus (HCC)    Chronic, stable. Continue current regimen of simvastatin 40mg  daily.  The ASCVD Risk score (Arnett DK, et al., 2019) failed to calculate for the following reasons:   The patient has a prior MI or stroke diagnosis       Relevant Medications   OZEMPIC, 0.25 OR 0.5 MG/DOSE, 2 MG/3ML SOPN   furosemide (LASIX) 20 MG tablet   Normocytic anemia    Chronic. Hgb 11.7 with normal kidney function, normal iron levels - anticipate anemia of chronic disease.       Bipolar disorder (HCC)    Continues prozac 60mg  and lamictal 400mg  daily.  Followed by psychiatry.      Essential hypertension    Chronic, stable period.       Relevant Medications   furosemide (LASIX) 20 MG tablet   Asthma    Continues symbicofr, singulair, PRN albuterol       GERD    Continues pantoprazole daily      Osteoarthritis   OSA treated with BiPAP    Has recently seen Dr Thora Lance pulmonology.       Morbid obesity with BMI of 60.0-69.9, adult (HCC)   Relevant Medications   OZEMPIC, 0.25 OR 0.5 MG/DOSE, 2 MG/3ML SOPN   Aortic atherosclerosis (HCC)    Continue statin, aspirin.       Relevant Medications   furosemide (LASIX) 20 MG tablet   Ex-smoker   Centrilobular emphysema (HCC)    Continue symbicort and albuterol PRN.       Vitamin D insufficiency    Levels mildly low -  intermittently forgets daily replacement 2000IU.       Neurogenic urinary incontinence    Ongoing mixed incontinence, previously saw urology but was unable to tolerate antimuscarinics (led to hospitalization due to GI symptoms). Consider myrbetriq.       Wound infection complicating hardware (HCC)    H/o this, followed by ID on daily doxycycline.       Relevant Medications   fosfomycin (MONUROL) 3 g PACK   Osteopenia    Encouraged regular vit D intake.       HFrEF (heart failure with reduced ejection fraction) (HCC)    Appreciate cardiology care.       Relevant Medications   furosemide (LASIX) 20 MG tablet   UTI (urinary tract infection)    Recent ESBL treated with fosfomycin, now with recurrent symptoms for several weeks. Will retreat with fosfomycin 3gm , will notify ID.       Relevant Medications   fosfomycin (MONUROL)  3 g PACK   Non-ischemic cardiomyopathy (HCC)   Relevant Medications   furosemide (LASIX) 20 MG tablet   Chronic respiratory failure with hypoxia, on home O2 therapy (HCC)   Obesity hypoventilation syndrome (HCC)   Relevant Medications   OZEMPIC, 0.25 OR 0.5 MG/DOSE, 2 MG/3ML SOPN   RESOLVED: Dysuria   Relevant Orders   POCT Urinalysis Dipstick (Automated) (Completed)   Urine Culture (Completed)     Meds ordered this encounter  Medications   OZEMPIC, 0.25 OR 0.5 MG/DOSE, 2 MG/3ML SOPN    Sig: Inject 0.5 mg into the skin every 7 (seven) days.    Dispense:  9 mL    Refill:  3   fosfomycin (MONUROL) 3 g PACK    Sig: Take 3 g by mouth every 3 (three) days for 3 doses.    Dispense:  9 g    Refill:  0   Orders Placed This Encounter  Procedures   Urine Culture   POCT Urinalysis Dipstick (Automated)     Patient instructions : If interested, check with pharmacy about new 2 shot shingles series (shingrix).  Urinalysis today or drop off sterile cup given new UTI symptoms - will culture if needed Update DNR and advanced directive.  Return in 6  months for diabetes follow up visit.   Follow up plan: Return in about 6 months (around 09/20/2022) for follow up visit.  [name], MD

## 2022-03-23 LAB — URINE CULTURE
MICRO NUMBER:: 13564926
SPECIMEN QUALITY:: ADEQUATE

## 2022-03-24 DIAGNOSIS — R300 Dysuria: Secondary | ICD-10-CM | POA: Insufficient documentation

## 2022-03-24 MED ORDER — FOSFOMYCIN TROMETHAMINE 3 G PO PACK: 3.0000 g | PACK | ORAL | 0 refills | Status: AC

## 2022-03-24 NOTE — Assessment & Plan Note (Signed)
Appreciate cardiology care.  °

## 2022-03-24 NOTE — Assessment & Plan Note (Addendum)
Continues prozac 60mg  and lamictal 400mg  daily.  Followed by psychiatry.

## 2022-03-24 NOTE — Assessment & Plan Note (Addendum)
Ongoing mixed incontinence, previously saw urology but was unable to tolerate antimuscarinics (led to hospitalization due to GI symptoms). Consider myrbetriq.

## 2022-03-25 ENCOUNTER — Encounter: Payer: Self-pay | Admitting: Family Medicine

## 2022-03-25 ENCOUNTER — Ambulatory Visit (HOSPITAL_COMMUNITY)
Admission: RE | Admit: 2022-03-25 | Discharge: 2022-03-25 | Disposition: A | Discharge status: Home / Self Care | Payer: Medicare Other | Source: Ambulatory Visit | Attending: Student | Admitting: Student

## 2022-03-25 DIAGNOSIS — R0609 Other forms of dyspnea: Secondary | ICD-10-CM | POA: Diagnosis not present

## 2022-03-25 LAB — BLOOD GAS, ARTERIAL
Acid-Base Excess: 5.3 mmol/L — ABNORMAL HIGH (ref 0.0–2.0)
Bicarbonate: 30.5 mmol/L — ABNORMAL HIGH (ref 20.0–28.0)
O2 Saturation: 97.3 %
Patient temperature: 37
pCO2 arterial: 46 mmHg (ref 32–48)
pH, Arterial: 7.43 (ref 7.35–7.45)
pO2, Arterial: 83 mmHg (ref 83–108)

## 2022-03-27 ENCOUNTER — Telehealth: Payer: Medicare Other

## 2022-03-27 ENCOUNTER — Ambulatory Visit (INDEPENDENT_AMBULATORY_CARE_PROVIDER_SITE_OTHER): Payer: Medicare Other

## 2022-03-27 DIAGNOSIS — E1142 Type 2 diabetes mellitus with diabetic polyneuropathy: Secondary | ICD-10-CM

## 2022-03-27 DIAGNOSIS — I5022 Chronic systolic (congestive) heart failure: Secondary | ICD-10-CM

## 2022-03-27 DIAGNOSIS — J984 Other disorders of lung: Secondary | ICD-10-CM

## 2022-03-27 NOTE — Chronic Care Management (AMB) (Signed)
Chronic Care Management   CCM RN Visit Note  03/27/2022 Name: Amy Barnes MRN: [id number] DOB: 27-Oct-1950  Subjective: Amy Barnes is a 71 y.o. year old female who is a primary care patient of Ria Bush, MD. The care management team was consulted for assistance with disease management and care coordination needs.    Engaged with patient by telephone for follow up visit in response to provider referral for case management and/or care coordination services.   Consent to Services:  The patient was given information about Chronic Care Management services, agreed to services, and gave verbal consent prior to initiation of services.  Please see initial visit note for detailed documentation.   Patient agreed to services and verbal consent obtained.   Assessment: Review of patient past medical history, allergies, medications, health status, including review of consultants reports, laboratory and other test data, was performed as part of comprehensive evaluation and provision of chronic care management services.   SDOH (Social Determinants of Health) assessments and interventions performed:    CCM Care Plan  Allergies  Allergen Reactions   Toviaz [Fesoterodine] Nausea And Vomiting    Severe reaction - s/p several ER visits then hospitalization ?stress induced cardiomyopathy   Cephalexin Hives   Hydrocodone Other (See Comments)    Reaction:  Hallucinations    Entresto [Sacubitril-Valsartan]    Macrodantin [Nitrofurantoin]     Dyspnea, lips peeling   Tolterodine Tartrate Nausea And Vomiting   Risperidone And Related Other (See Comments)    Reaction:  Made pt excessively sleepy   Seroquel [Quetiapine Fumarate] Other (See Comments)    Reaction:  Made pt excessively sleepy   Sulfa Antibiotics Rash    Tolerated bactrim course well 06/2020    Outpatient Encounter Medications as of 03/27/2022  Medication Sig Note   albuterol (PROVENTIL HFA;VENTOLIN HFA) 108 (90 Base) MCG/ACT  inhaler Inhale 2 puffs into the lungs every 6 (six) hours as needed for wheezing or shortness of breath.    albuterol (PROVENTIL) (2.5 MG/3ML) 0.083% nebulizer solution Take 3 mLs (2.5 mg total) by nebulization every 6 (six) hours as needed for wheezing or shortness of breath.    albuterol (PROVENTIL) (5 MG/ML) 0.5% nebulizer solution Take 0.5 mLs (2.5 mg total) by nebulization every 4 (four) hours as needed for wheezing or shortness of breath.    aspirin 81 MG EC tablet Take 81 mg by mouth at bedtime.    budesonide-formoterol (SYMBICORT) 160-4.5 MCG/ACT inhaler Inhale 2 puffs into the lungs 2 (two) times daily.    carvedilol (COREG) 6.25 MG tablet Take 1 tablet (6.25 mg total) by mouth 2 (two) times daily.    Cholecalciferol (VITAMIN D) 50 MCG (2000 UT) CAPS Take 1 capsule (2,000 Units total) by mouth daily.    dicyclomine (BENTYL) 20 MG tablet Take 1 tablet (20 mg total) by mouth in the morning and at bedtime. (Patient taking differently: Take 20 mg by mouth in the morning and at bedtime. As needed)    doxycycline (VIBRA-TABS) 100 MG tablet TAKE ONE TABLET BY MOUTH TWICE A DAY    ferrous sulfate 325 (65 FE) MG EC tablet Take 1 tablet (325 mg total) by mouth every Monday, Wednesday, and Friday. 02/18/2021: Daughter states patient has not started yet. Daughter will pick up medicine for patient.    fluconazole (DIFLUCAN) 200 MG tablet Take 200 mg by mouth daily. Daughter states patient is taking 1 in am 02/18/2021: As needed    fosfomycin (MONUROL) 3 g PACK Take 3 g  by mouth every 3 (three) days for 3 doses.    furosemide (LASIX) 20 MG tablet Take 2 tablets (40 mg total) by mouth 2 (two) times daily as needed (as directed).    gabapentin (NEURONTIN) 100 MG capsule TAKE 1-2 CAPSULES BY MOUTH AT BEDTIME    glimepiride (AMARYL) 1 MG tablet Take 1 tablet (1 mg total) by mouth daily with breakfast.    lamoTRIgine (LAMICTAL) 200 MG tablet Take 400 mg by mouth daily.    levothyroxine (SYNTHROID) 150 MCG tablet  TAKE ONE TABLET BY MOUTH AT BEDTIME    montelukast (SINGULAIR) 10 MG tablet TAKE ONE TABLET BY MOUTH EACH NIGHT AT BEDTIME    Multiple Vitamin (MULTIVITAMIN WITH MINERALS) TABS tablet Take 2 tablets by mouth daily.     ondansetron (ZOFRAN ODT) 8 MG disintegrating tablet Take 1 tablet (8 mg total) by mouth 2 (two) times daily as needed for nausea or vomiting.    OZEMPIC, 0.25 OR 0.5 MG/DOSE, 2 MG/3ML SOPN Inject 0.5 mg into the skin every 7 (seven) days.    pantoprazole (PROTONIX) 40 MG tablet TAKE ONE TABLET BY MOUTH TWICE A DAY    polyethylene glycol (MIRALAX / GLYCOLAX) 17 g packet Take 17 g by mouth 2 (two) times daily as needed for mild constipation.    potassium chloride (KLOR-CON) 10 MEQ tablet Take 1 tablet (10 mEq total) by mouth daily. With lasix. May take an additional 10 meq when taking 60 meq of lasix    prochlorperazine (COMPAZINE) 10 MG tablet Take 1 tablet (10 mg total) by mouth every 6 (six) hours as needed for nausea or vomiting.    promethazine (PHENERGAN) 25 MG suppository Place 1 suppository (25 mg total) rectally every 6 (six) hours as needed for nausea or vomiting.    PROZAC 20 MG capsule Take 60 mg by mouth daily.    simvastatin (ZOCOR) 40 MG tablet TAKE ONE TABLET BY MOUTH EVERY EVENING    sucralfate (CARAFATE) 1 GM/10ML suspension Take 10 mLs (1 g total) by mouth 4 (four) times daily -  with meals and at bedtime. (Patient taking differently: Take 1 g by mouth 4 (four) times daily -  with meals and at bedtime. Pt takes as needed.)    No facility-administered encounter medications on file as of 03/27/2022.    Patient Active Problem List   Diagnosis Date Noted   Restrictive lung disease 01/20/2022   Medicare annual wellness visit, subsequent 02/09/2021   DNR (do not resuscitate) 05/31/2020   Chronic respiratory failure with hypoxia, on home O2 therapy ([place]) 05/04/2020   Obesity hypoventilation syndrome ([place]) 05/04/2020   LVH (left ventricular hypertrophy) 05/04/2020    Pneumonia due to COVID-19 virus 05/03/2020   Non-ischemic cardiomyopathy ([place]) 12/29/2019   HFrEF (heart failure with reduced ejection fraction) ([place]) 11/25/2019   UTI (urinary tract infection) 11/25/2019   NSTEMI (non-ST elevated myocardial infarction) ([place]) 11/09/2019   QT prolongation 11/09/2019   Nausea and vomiting 11/03/2019   Diminished pulses in lower extremity 08/11/2019   Osteopenia 08/11/2019   Wound infection complicating hardware ([place]) 04/01/2019   Lumbar adjacent segment disease with spondylolisthesis 03/23/2019   Neurogenic urinary incontinence 11/29/2018   Urinary incontinence 11/29/2018   Bladder spasms 11/29/2018   Discogenic low back pain 11/29/2018   Chronic sacroiliac joint pain (Bilateral) (R>L) 11/29/2018   Spondylosis without myelopathy or radiculopathy, lumbar region 11/09/2018   Chronic low back pain (Bilateral) (L>R) w/o sciatica 11/09/2018   History of hip replacement (Left) 10/13/2018   History  of back surgery 10/13/2018   Vitamin D insufficiency 10/13/2018   Osteoarthritis of facet joint of lumbar spine 10/13/2018   Osteoarthritis involving multiple joints 10/13/2018   Cervical radiculopathy (Left) 10/13/2018   Lumbar facet arthropathy (Bilateral) 10/13/2018   Lumbar facet syndrome (Bilateral) (L>R) 10/13/2018   Abnormal MRI, lumbar spine (09/16/2018) 10/13/2018   Lumbar nerve root compression (L4) (Left) 10/13/2018   Lumbar foraminal stenosis 10/13/2018   Chronic hip pain after total replacement x 3 (Left) 10/13/2018   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   Chronic low back pain (Primary Area of Pain) (Bilateral) (L>R) w/ sciatica (Left) 09/14/2018   Chronic lower extremity pain (Secondary Area of Pain) (Left) 09/14/2018   Chronic hip pain (Tertiary Area of Pain) (Bilateral) (L>R) 09/14/2018   Chronic knee pain (Fourth Area of Pain) (Bilateral) (R>L) 09/14/2018   Polypharmacy 09/14/2018   Disorder of skeletal system 09/14/2018   Problems  influencing health status 09/14/2018   Primary osteoarthritis of first carpometacarpal joint of left hand 04/20/2018   PAH (pulmonary artery hypertension) ([place]) 08/03/2017   Advanced care planning/counseling discussion 06/25/2017   Synovial cyst of lumbar facet joint 11/13/2016   CAD (coronary artery disease) 07/30/2016   Centrilobular emphysema ([place]) 07/30/2016   Ex-smoker 07/08/2016   Nonspecific abnormal electrocardiogram (ECG) (EKG) 07/08/2016   Chronic lumbar radicular pain (L4) (Bilateral) 06/09/2016   DOE (dyspnea on exertion) 05/22/2016   Degenerative joint disease (DJD) of hip 04/17/2016   Somatic dysfunction of sacroiliac joint 04/17/2016   S/P Lumbar Fusion (L3-4 PLIF) 04/17/2016   Cervical fusion syndrome 04/17/2016   Pedal edema 02/19/2016   Chronic pain syndrome 02/19/2016   Aortic atherosclerosis ([place]) 12/11/2015   MRSA (methicillin resistant Staphylococcus aureus) infection 10/11/2012   Infection or inflammatory reaction due to internal joint prosthesis ([place]) 12/03/2011   Morbid obesity with BMI of 60.0-69.9, adult ([place]) 05/13/2011   Essential hypertension 05/17/2010   Cervical radiculopathy (Right) 12/25/2009   Benign positional vertigo 07/21/2008   Hyperlipidemia associated with type 2 diabetes mellitus ([place]) 08/16/2007   Hypothyroidism 07/30/2007   Type 2 diabetes mellitus with peripheral neuropathy ([place]) 07/30/2007   Normocytic anemia 07/30/2007   Bipolar disorder ([place]) 07/30/2007   Allergic rhinitis 07/30/2007   Asthma 07/30/2007   GERD 07/30/2007   Osteoarthritis 07/30/2007   OSA treated with BiPAP 07/30/2007    Conditions to be addressed/monitored:CHF, DMII, and Restrictive lung disease  Care Plan : [facility] plan of care  Updates made by Dannielle Karvonen, RN since 03/27/2022 12:00 AM     Problem: Chronic disease management education and care coordination needs.   Priority: High     Long-Range Goal: development of plan of care to address chronic disease  management and care coordination needs.   Start Date: 11/12/2021  Expected End Date: 03/28/2022  Priority: High  Note:   Goals met. Case closed Current Barriers:  Knowledge Deficits related to plan of care for management of CHF and DMII  Care Coordination needs related to symptoms  Patient reports having annual wellness visit on 03/21/22.  Patients states most recent Hgb A1 c is 7.4 down from 13.7.  Patient states she is taking her medications as prescribed. Daughter attends provider visits with her.  Patient reports having next follow up visit with pulmonologist on 04/07/22. Patient denies any increase in heart failure symptoms. Patient reports current weight 370 lbs.  Discussed closing case.  Patient verbally agreed to case closure.  RNCM Clinical Goal(s):  Patient will verbalize understanding of plan for management of  CHF and DMII as evidenced by patient self report and/ or notation in chart. take all medications exactly as prescribed and will call provider for medication related questions as evidenced by patient self report and/ or notation in chart.     attend all scheduled medical appointments:  as evidenced by patient self report and/ or notation in chart.         continue to work with Consulting civil engineer and/or Social Worker to address care management and care coordination needs related to CHF and DMII as evidenced by adherence to CM Team Scheduled appointments     through collaboration with Consulting civil engineer, provider, and care team.   Interventions: 1:1 collaboration with primary care provider regarding development and update of comprehensive plan of care as evidenced by provider attestation and co-signature Inter-disciplinary care team collaboration (see longitudinal plan of care) Evaluation of current treatment plan related to  self management and patient's adherence to plan as established by provider   Heart Failure Interventions:  (Status:  Goal Met.)  Discussed importance of daily weight  and advised patient to weigh and record daily Reviewed heart failure signs/ symptoms Discussed the importance of keeping all appointments with provider Reviewed heart failure action plan and zones Reviewed medications with patient and discussed importance of adherence.   Diabetes Interventions:  (Status:  Goal Met.)  Assessed patient's understanding of A1c goal:  7.9 Reviewed medications with patient and discussed importance of medication adherence Discussed plans with patient for ongoing care management follow up and provided patient with direct contact information for care management team Reviewed scheduled/upcoming provider appointments Discussed risk of elevated blood sugars and importance of diet modification, exercise, lifestyle changes to manage blood sugars Advised patient the importance of checking her blood sugars daily   Lab Results  Component Value Date   HGBA1C 7.4 (H) 03/12/2022    Restrictive Lung disease:  (Status: Goal met) Long Term Goal Reviewed medications with patient and discussed importance of medication adherence Discussed plans with patient for ongoing care management follow up and provided patient with direct contact information for care management team  Reviewed scheduled / upcoming provider appointments.  Advised patient to  weigh daily and take lasix as per pulmonologist instructions  Patient Goals/Self-Care Activities: Continue to take medications as prescribed   Attend all scheduled provider appointments Call pharmacy for medication refills 3-7 days in advance of running out of medications Call provider office for new concerns or questions  call office for weight gain of 3 pounds overnight or  5 pounds in one week.  Continue to weigh daily and record Evaluate your symptoms based on your Heart failure action plan. If you are in the yellow zone call your doctor to report symptoms.   Call 911 for severe symptoms Continue to follow a low salt diet and adhere  to fluid restriction recommendations.   Wear oxygen as advised by provider and check oxygen saturations daily Continue to check blood sugar at least 2-3 times per day.  Always recheck blood sugar after an elevated  or low reading.       Plan:No further follow up required: Goals met. Case closed [name] RN,BSN,CCM RN Case Manager [place]  [phone]

## 2022-03-27 NOTE — Patient Instructions (Signed)
Visit Information Amy Barnes Congratulations on achieving your goals! It was a pleasure working with you, and I hope you continue to make great strides in improving your health.  Follow up Plan: If further intervention is needed the care management team is available to follow up after a formal CCM referral is placed by your provider.  No follow up is scheduled with CCM team at this time. Goals have been met.  Case is closed  Please review and follow these recommendations discussed today.   Continue to take medications as prescribed   Attend all scheduled provider appointments Call pharmacy for medication refills 3-7 days in advance of running out of medications Call provider office for new concerns or questions  call office for weight gain of 3 pounds overnight or  5 pounds in one week.  Continue to weigh daily and record Evaluate your symptoms based on your Heart failure action plan. If you are in the yellow zone call your doctor to report symptoms.   Call 911 for severe symptoms Continue to follow a low salt diet and adhere to fluid restriction recommendations.   Wear oxygen as advised by provider and check oxygen saturations daily Continue to check blood sugar at least 2-3 times per day.  Always recheck blood sugar after an elevated  or low reading.     If you are experiencing a Mental Health or [place] or need someone to talk to, please call the Suicide and Crisis Lifeline: 988 call 1-800-273-TALK (toll free, 24 hour hotline)   Patient verbalizes understanding of instructions and care plan provided today and agrees to view in [place]. Active MyChart status and patient understanding of how to access instructions and care plan via MyChart confirmed with patient.     [name] RN,BSN,CCM RN Case Manager [place]  [phone]

## 2022-03-28 DIAGNOSIS — Z7985 Long-term (current) use of injectable non-insulin antidiabetic drugs: Secondary | ICD-10-CM

## 2022-03-28 DIAGNOSIS — E1159 Type 2 diabetes mellitus with other circulatory complications: Secondary | ICD-10-CM

## 2022-03-28 DIAGNOSIS — I509 Heart failure, unspecified: Secondary | ICD-10-CM

## 2022-04-04 NOTE — Progress Notes (Unsigned)
Synopsis: Referred for restrictive lung disease by Ria Bush, MD  Subjective:   PATIENT ID: [name] GENDER: female DOB: Nov 09, 1950, MRN: [id number]  No chief complaint on file.  20yF with history of AR, asthma, covid-19 pna - hospitalized in 2021, OSA on autoBiPAP (PS 4, EPAP min 15, IPAP max 25) followed by Dr. Claiborne Billings, CAD, BiPD, DM, GERD, HTN, hypothyroid, 60 py smoker quit [id number], chronic systolic heart failure due to NICM referred for chronic hypoxemic respiratory failure on O2 per  2L at rest and 4L with exertion DME supplier [employer] which had been presumed due to OHS.   She says that she had had gradually worsening DOE since covid-19 infection and then over the weekend she had more trouble DOE than typical for her. She has some cough but it is dry. Has had cough for a long time but not especially bothersome to her. She does have orthopnea but this is chronic and she has bed that will elevated for her. She has gained about 30 lb over the last year and 10 lb over the last week.   She is on symbicort - has been on it for about a month. She hasn't noticed that it seems all that helpful for her.   PFT 01/14/22 with moderate restriction, moderately-mildly reduced diffusing capacity. Mildly elevated/normal DL/VA. Very low ERV.  Interval HPI:  PFTs today  HRCT Chest with mosaicism  ABG with very mild hypercapnia  Otherwise pertinent review of systems is negative.  Past Medical History:  Diagnosis Date   Allergic rhinitis    Ambulates with cane    straight   Anemia    Anxiety    Asthma    seasonal   Bipolar affective disorder ([place])    takes Synthroid meds for Bipolar   CAD (coronary artery disease) 07/2016   by CT scan   Carpal tunnel syndrome    had surgery but occasional still has some issues per patient   Cataract    Centrilobular emphysema ([place]) 07/2016   by CT scan - pt not aware of this   Depression with anxiety    Diabetes mellitus    type 2 - no meds  diet controlled   GERD (gastroesophageal reflux disease)    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 - 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 PO   Hyperlipidemia    Hypertension    Hypothyroidism    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 01/16/2018   prevnar 13 - large localized reaction 12/2017   OSA (obstructive sleep apnea)    no longer using cpap, uses a bed that raises and lowers hob   Osteoarthritis    Osteopenia 08/11/2019   DEXA 07/2019 - T -1.1 R femur (osteopenia)   Pneumonia due to COVID-19 virus 05/03/2020   Restless legs    Septic arthritis ([place]) 10/11/2012   Shingles 06/30/2016   Thoracic aortic atherosclerosis ([place]) 11/207   by CT     Family History  Problem Relation Age of Onset   Aneurysm Father 14       brain   Alcohol abuse Father    Cancer Father        possibly   CAD Other        several siblings   Cancer Brother        prostate   Diabetes Brother    Diabetes Sister    Anesthesia problems Neg Hx    Hypotension Neg Hx    Malignant hyperthermia Neg Hx  Pseudochol deficiency Neg Hx    Breast cancer Neg Hx      Past Surgical History:  Procedure Laterality Date   BREAST CYST ASPIRATION     CARPAL TUNNEL RELEASE Right 05/15/2011   CARPAL TUNNEL RELEASE Left 12/24/2017   Procedure: LEFT CARPAL TUNNEL RELEASE;  Surgeon: [name], MD;  Location: [facility];  Service: Orthopedics;  Laterality: Left;   CERVICAL FUSION  2012   C2/3/4   COLONOSCOPY WITH PROPOFOL N/A 12/31/2015   diverticulosis, int hem, o/w normal rpt 10 yrs ([place])   ESOPHAGOGASTRODUODENOSCOPY (EGD) WITH PROPOFOL N/A 11/12/2019   Procedure: ESOPHAGOGASTRODUODENOSCOPY (EGD) WITH PROPOFOL;  Surgeon: Sherrilyn Rist, MD;  Location[facility];  Service: Gastroenterology;  Laterality: N/A;   EYE SURGERY Bilateral    cataract surgery with lens implant   FOOT SURGERY  1982   bone spur   I & D EXTREMITY  09/06/2012   Budd Palmer, MD; Right;  I&D of right thigh   I & D EXTREMITY  Left 07/2013   wound vac - daily doxycycline indefinitely   KNEE ARTHROSCOPY  09/01/2012   Dannielle Huh, MD;  Right   LEFT HEART CATH AND CORONARY ANGIOGRAPHY N/A 11/10/2019   Procedure: LEFT HEART CATH AND CORONARY ANGIOGRAPHY;  Surgeon: Lennette Bihari, MD;  Location: [facility];  Service: Cardiovascular;  Laterality: N/A;   LUMBAR FUSION  01/08/2012   L3-4   LUMBAR FUSION  02/2019   unexpectedly discovered MRSA infection - pus Lovell Sheehan)    LUMBAR LAMINECTOMY/DECOMPRESSION MICRODISCECTOMY N/A 11/13/2016   LAMINOTOMY/LAMINECTOMY LUMBAR FOUR LUMBAR FIVE  WITH RESECTION OF SYNOVIAL CYST;  Surgeon: Tressie Stalker, MD   NECK SURGERY     Herniated disk C2,3,4   NOSE SURGERY     PARTIAL HYSTERECTOMY  1984   for mennorhagia, ovaries remain   REVISION TOTAL HIP ARTHROPLASTY Left 08/28/2011   TMJ ARTHROPLASTY  1982   TOTAL HIP ARTHROPLASTY Left 2002   TRIGGER FINGER RELEASE Right 05/15/2011   long finger    Social History   Socioeconomic History   Marital status: Married    Spouse name: Not on file   Number of children: 3   Years of education: Not on file   Highest education level: Not on file  Occupational History   Occupation: Unemployed    Employer: UNEMPLOYED  Tobacco Use   Smoking status: Former    Packs/day: 1.50    Years: 40.00    Total pack years: 60.00    Types: Cigarettes    Quit date: 04/29/2009    Years since quitting: 12.9   Smokeless tobacco: Never  Vaping Use   Vaping Use: Never used  Substance and Sexual Activity   Alcohol use: Yes    Alcohol/week: 2.0 standard drinks of alcohol    Types: 2 Shots of liquor per week    Comment: 2 shots/weekly   Drug use: No   Sexual activity: Not Currently    Comment: Hysterectomy  Other Topics Concern   Not on file  Social History Narrative   Married   3 children   Social Determinants of Health   Financial Resource Strain: Low Risk  (12/11/2020)   Overall Financial Resource Strain (CARDIA)    Difficulty of  Paying Living Expenses: Not hard at all  Food Insecurity: No Food Insecurity (12/11/2020)   Hunger Vital Sign    Worried About Running Out of Food in the Last Year: Never true    Ran Out of  Food in the Last Year: Never true  Transportation Needs: No Transportation Needs (01/20/2020)   PRAPARE - Administrator, Civil Service (Medical): No    Lack of Transportation (Non-Medical): No  Physical Activity: Inactive (12/11/2020)   Exercise Vital Sign    Days of Exercise per Week: 0 days    Minutes of Exercise per Session: 0 min  Stress: Stress Concern Present (01/20/2020)   [employer] of Occupational Health - Occupational Stress Questionnaire    Feeling of Stress : To some extent  Social Connections: Socially Integrated (12/11/2020)   Social Connection and Isolation Panel [NHANES]    Frequency of Communication with Friends and Family: More than three times a week    Frequency of Social Gatherings with Friends and Family: More than three times a week    Attends Religious Services: More than 4 times per year    Active Member of [employer] or Organizations: Yes    Attends Banker Meetings: Not on file    Marital Status: Married  Intimate Partner Violence: Not At Risk (12/11/2020)   Humiliation, Afraid, Rape, and Kick questionnaire    Fear of Current or Ex-Partner: No    Emotionally Abused: No    Physically Abused: No    Sexually Abused: No     Allergies  Allergen Reactions   Toviaz [Fesoterodine] Nausea And Vomiting    Severe reaction - s/p several ER visits then hospitalization ?stress induced cardiomyopathy   Cephalexin Hives   Hydrocodone Other (See Comments)    Reaction:  Hallucinations    Entresto [Sacubitril-Valsartan]    Macrodantin [Nitrofurantoin]     Dyspnea, lips peeling   Tolterodine Tartrate Nausea And Vomiting   Risperidone And Related Other (See Comments)    Reaction:  Made pt excessively sleepy   Seroquel [Quetiapine Fumarate] Other (See  Comments)    Reaction:  Made pt excessively sleepy   Sulfa Antibiotics Rash    Tolerated bactrim course well 06/2020     Medications reviewed      Objective:   Physical Exam:  General appearance: 71 y.o., female, NAD, conversant  Eyes: anicteric sclerae; PERRL, tracking appropriately HENT: NCAT; MMM Neck: Trachea midline; no lymphadenopathy, no JVD Lungs: diminished bilaterally, no crackles, no wheeze, with normal respiratory effort CV: RRR, no murmur  Abdomen: Soft, non-tender; non-distended, BS present  Extremities: No peripheral edema, warm Skin: Normal turgor and texture; no rash Psych: Appropriate affect Neuro: Alert and oriented to person and place, no focal deficit     There were no vitals filed for this visit.    on 2L O2 BMI Readings from Last 3 Encounters:  03/21/22 67.90 kg/m  02/18/22 65.40 kg/m  12/23/21 63.85 kg/m   Wt Readings from Last 3 Encounters:  03/21/22 (!) 377 lb 4 oz (171.1 kg)  02/18/22 (!) 381 lb (172.8 kg)  12/23/21 (!) 372 lb (168.7 kg)     CBC    Component Value Date/Time   WBC 6.3 03/12/2022 0739   RBC 3.94 03/12/2022 0739   HGB 11.7 (L) 03/12/2022 0739   HGB 11.8 (L) 09/25/2012 1729   HCT 35.7 (L) 03/12/2022 0739   HCT 35.9 09/25/2012 1729   PLT 237.0 03/12/2022 0739   PLT 404 09/25/2012 1729   MCV 90.5 03/12/2022 0739   MCV 82 09/25/2012 1729   MCH 28.5 11/23/2020 1701   MCHC 32.9 03/12/2022 0739   RDW 15.3 03/12/2022 0739   RDW 16.8 (H) 09/25/2012 1729   LYMPHSABS 1.7 03/12/2022  0739   MONOABS 0.7 03/12/2022 0739   EOSABS 0.2 03/12/2022 0739   BASOSABS 0.0 03/12/2022 0739    Chest Imaging: LDCT Chest 09/13/20 reviewed by me with bronchial wall thickening, mosaic attenuation (though not in expiration), stable LNs/small nodule  HRCT Chest 02/28/22 with mosaicism vs widespread ggo  Pulmonary Functions Testing Results:    Latest Ref Rng & Units 01/14/2022   10:54 AM  PFT Results  FVC-Pre L 1.60   FVC-Predicted  Pre % 52   FVC-Post L 1.63   FVC-Predicted Post % 53   Pre FEV1/FVC % % 84   Post FEV1/FCV % % 89   FEV1-Pre L 1.34   FEV1-Predicted Pre % 58   FEV1-Post L 1.45   DLCO uncorrected ml/min/mmHg 11.81   DLCO UNC% % 60   DLVA Predicted % 103   TLC L 3.39   TLC % Predicted % 67   RV % Predicted % 82    REviewed by me with moderate restriction, very low ERV, moderately reduced diffusing capacity with normal KCO    Echocardiogram:  Study Result      ECHOCARDIOGRAM REPORT         Patient Name:   BRUCE MARTINDALE Fann Date of Exam: 02/20/2020  Medical Rec #:  [id number]     Height:       63.0 in  Accession #:    [id number]    Weight:       380.2 lb  Date of Birth:  [birth year]      BSA:          2.541 m  Patient Age:    63 years      BP:           128/76 mmHg  Patient Gender: F             HR:           86 bpm.  Exam Location:  [address]   Procedure: 2D Echo, Cardiac Doppler, Color Doppler and Intracardiac             Opacification Agent   Indications:    I42.9 Cardiomyopathy (unspecified)     History:        Patient has prior history of Echocardiogram examinations,  most                  recent 11/09/2019. CAD, COPD, Arrythmias:LBBB; Risk                  Factors:Dyslipidemia, Hypertension and Diabetes. Morbid  obesity.                  NICM. Hypothyroidism. Sleep apnea. Chronic back pain.     Sonographer:    Diamond Nickel RCS  Referring Phys: [place]     1. Left ventricular ejection fraction, by estimation, is 60 to 65%. The  left ventricle has normal function. The left ventricle has no regional  wall motion abnormalities. There is moderate left ventricular hypertrophy  of the basal-septal segment. Left  ventricular diastolic parameters are indeterminate. Elevated left  ventricular end-diastolic pressure.   2. Right ventricular systolic function is normal. The right ventricular  size is normal. Tricuspid regurgitation signal is inadequate for  assessing  PA pressure.   3. The mitral valve is normal in structure. Trivial mitral valve  regurgitation. No evidence of mitral stenosis.   4. The aortic valve is normal in structure. Aortic valve regurgitation is  not visualized. No aortic stenosis is present.   5. The inferior vena cava is dilated in size with >50% respiratory  variability, suggesting right atrial pressure of 8 mmHg.   6. Compared to echo 10/2019, LVF has normalized.          Assessment & Plan:   # DOE # Restrictive lung disease  # Possible moderate persistent asthma Likely multifactorial - suspect she may have some volume overload given weight gain, deconditioning, possibility of undertreated OHS despite excellent autoBiPAP adherence with very low leak. She says she has history of asthma - hard to exclude as she was on laba/ics before PFTs last month. With very low ERV her restriction is probably mostly due to obesity however her DLCO is moderately reduced and KCO is not elevated raising possibility of at least a component of parenchymal lung disease beyond airways.   # Chronic hypoxemic respiratory failure: On 2L O2 per [place] at rest, 4L with exertion, supplier is [employer]  # OSA on autoBiPAP  (PS 4, EPAP min 15, IPAP max 25) with resmed device. Possible/likely superimposed OHS.  Plan: - Take lasix 40 mg twice daily until back down to 370 lb. Then resume daily lasix 40 mg. Can increase to twice daily dosing if gain 3 lb in a day or 5 lb in a week. Take extra potassium when you're taking twice daily lasix - ABG to look for chronic hypoventilation that might prompt [place] to do PAP titration with TCO2 monitoring - BNP - HRCT Chest to look for any evidence of lung scarring - continue symbicort 2 puffs twice daily with spacer, singulair 10 mg daily - albuterol prn - see you in 6 weeks!     Omar Person, MD [facility] 04/04/2022 2:40 PM

## 2022-04-07 ENCOUNTER — Encounter: Payer: Self-pay | Admitting: Student

## 2022-04-07 ENCOUNTER — Ambulatory Visit (INDEPENDENT_AMBULATORY_CARE_PROVIDER_SITE_OTHER): Payer: Medicare Other | Admitting: Student

## 2022-04-07 VITALS — BP 134/74 | HR 76 | Temp 98.1°F | Ht 62.5 in | Wt 377.0 lb

## 2022-04-07 DIAGNOSIS — J449 Chronic obstructive pulmonary disease, unspecified: Secondary | ICD-10-CM | POA: Diagnosis not present

## 2022-04-07 DIAGNOSIS — J9611 Chronic respiratory failure with hypoxia: Secondary | ICD-10-CM | POA: Diagnosis not present

## 2022-04-07 DIAGNOSIS — R0609 Other forms of dyspnea: Secondary | ICD-10-CM | POA: Diagnosis not present

## 2022-04-07 NOTE — Patient Instructions (Addendum)
-   Take lasix 40 mg twice daily until back down to 370 lb. Then resume daily lasix 40 mg. Can increase to twice daily dosing if gain 3 lb in a day or 5 lb in a week. Take extra potassium when you're taking twice daily lasix - STOP symbicort - START breztri 2 puffs twice daily rinse mouth after use - You will be called to schedule perfusion scan to look for chronic lung blood clot, echocardiogram to check your right heart function - see you in 4 weeks to go over these results!

## 2022-04-10 ENCOUNTER — Other Ambulatory Visit: Payer: Self-pay | Admitting: Student

## 2022-04-10 ENCOUNTER — Other Ambulatory Visit: Payer: Self-pay | Admitting: Family Medicine

## 2022-04-10 DIAGNOSIS — R0609 Other forms of dyspnea: Secondary | ICD-10-CM

## 2022-04-14 ENCOUNTER — Other Ambulatory Visit: Payer: Self-pay | Admitting: Family Medicine

## 2022-04-16 NOTE — Telephone Encounter (Signed)
This should go through ID.  Will forward to Dr Drue Second.

## 2022-04-17 ENCOUNTER — Other Ambulatory Visit: Payer: Self-pay

## 2022-04-17 MED ORDER — FLUCONAZOLE 200 MG PO TABS: 200.0000 mg | ORAL_TABLET | Freq: Every day | ORAL | 1 refills | Status: DC

## 2022-04-23 ENCOUNTER — Ambulatory Visit (HOSPITAL_COMMUNITY): Payer: Medicare Other | Attending: Cardiology

## 2022-04-23 DIAGNOSIS — R0609 Other forms of dyspnea: Secondary | ICD-10-CM | POA: Diagnosis not present

## 2022-04-23 LAB — ECHOCARDIOGRAM COMPLETE BUBBLE STUDY
Area-P 1/2: 4.06 cm2
S' Lateral: 4.1 cm

## 2022-04-23 MED ORDER — PERFLUTREN LIPID MICROSPHERE
1.0000 mL | INTRAVENOUS | Status: AC | PRN
  Administered 2022-04-23: 1 mL via INTRAVENOUS

## 2022-04-23 MED ORDER — SODIUM CHLORIDE 0.9% FLUSH
10.0000 mL | INTRAVENOUS | Status: DC | PRN
  Administered 2022-04-23: 20 mL via INTRAVENOUS

## 2022-04-24 ENCOUNTER — Other Ambulatory Visit: Payer: Self-pay

## 2022-04-24 ENCOUNTER — Telehealth (INDEPENDENT_AMBULATORY_CARE_PROVIDER_SITE_OTHER): Payer: Medicare Other | Admitting: Internal Medicine

## 2022-04-24 DIAGNOSIS — A4902 Methicillin resistant Staphylococcus aureus infection, unspecified site: Secondary | ICD-10-CM | POA: Diagnosis not present

## 2022-04-24 DIAGNOSIS — R300 Dysuria: Secondary | ICD-10-CM | POA: Diagnosis not present

## 2022-04-24 DIAGNOSIS — T847XXD Infection and inflammatory reaction due to other internal orthopedic prosthetic devices, implants and grafts, subsequent encounter: Secondary | ICD-10-CM

## 2022-04-24 MED ORDER — FLUCONAZOLE 200 MG PO TABS: 200.0000 mg | ORAL_TABLET | Freq: Every day | ORAL | 2 refills | Status: DC

## 2022-04-24 MED ORDER — AMOXICILLIN-POT CLAVULANATE 875-125 MG PO TABS: 1.0000 | ORAL_TABLET | Freq: Two times a day (BID) | ORAL | 0 refills | Status: DC

## 2022-04-24 NOTE — Progress Notes (Signed)
   Virtual Visit via Video Note  I connected with [name] on 04/24/22 at  4:00 PM EDT by a video enabled telemedicine application and verified that I am speaking with the correct person using two identifiers.  Location: Patient: at home Provider: at clinic   I discussed the limitations of evaluation and management by telemedicine and the availability of in person appointments. The patient expressed understanding and agreed to proceed.  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 The patient was provided an opportunity to ask questions and all were answered. The patient agreed with the plan and demonstrated an understanding of the instructions.   The patient was advised to call back or seek an in-person evaluation if the symptoms worsen or if the condition fails to improve as anticipated.  I provided 20 minutes of non-face-to-face time during this encounter.   Judyann Munson, MD

## 2022-04-25 ENCOUNTER — Other Ambulatory Visit: Payer: Medicare Other

## 2022-04-25 ENCOUNTER — Other Ambulatory Visit: Payer: Self-pay

## 2022-04-25 DIAGNOSIS — R300 Dysuria: Secondary | ICD-10-CM | POA: Diagnosis not present

## 2022-04-28 ENCOUNTER — Ambulatory Visit (HOSPITAL_COMMUNITY)
Admission: RE | Admit: 2022-04-28 | Discharge: 2022-04-28 | Disposition: A | Discharge status: Home / Self Care | Payer: Medicare Other | Source: Ambulatory Visit | Attending: Student | Admitting: Student

## 2022-04-28 ENCOUNTER — Encounter (HOSPITAL_COMMUNITY)
Admission: RE | Admit: 2022-04-28 | Discharge: 2022-04-28 | Disposition: A | Discharge status: Home / Self Care | Payer: Medicare Other | Source: Ambulatory Visit | Attending: Student | Admitting: Student

## 2022-04-28 DIAGNOSIS — R0609 Other forms of dyspnea: Secondary | ICD-10-CM | POA: Diagnosis not present

## 2022-04-28 DIAGNOSIS — R079 Chest pain, unspecified: Secondary | ICD-10-CM | POA: Diagnosis not present

## 2022-04-28 DIAGNOSIS — R0602 Shortness of breath: Secondary | ICD-10-CM | POA: Diagnosis not present

## 2022-04-28 LAB — URINALYSIS W MICROSCOPIC + REFLEX CULTURE
Bilirubin Urine: NEGATIVE
Glucose, UA: NEGATIVE
Hgb urine dipstick: NEGATIVE
Hyaline Cast: NONE SEEN /LPF
Ketones, ur: NEGATIVE
Nitrites, Initial: POSITIVE — AB
RBC / HPF: NONE SEEN /HPF (ref 0–2)
Specific Gravity, Urine: 1.02 (ref 1.001–1.035)
pH: 6.5 (ref 5.0–8.0)

## 2022-04-28 LAB — URINE CULTURE
MICRO NUMBER:: 13711506
SPECIMEN QUALITY:: ADEQUATE

## 2022-04-28 LAB — CULTURE INDICATED

## 2022-04-28 MED ORDER — TECHNETIUM TO 99M ALBUMIN AGGREGATED
4.1800 | Freq: Once | INTRAVENOUS | Status: AC | PRN
  Administered 2022-04-28: 4.18 via INTRAVENOUS

## 2022-04-29 DIAGNOSIS — F3181 Bipolar II disorder: Secondary | ICD-10-CM | POA: Diagnosis not present

## 2022-05-01 ENCOUNTER — Other Ambulatory Visit: Payer: Self-pay | Admitting: Family Medicine

## 2022-05-04 NOTE — Progress Notes (Signed)
Tte   Synopsis: Referred for restrictive lung disease by [name], MD  Subjective:   PATIENT ID: Amy Barnes GENDER: female DOB: March 04, 1951, MRN: [id number]  Chief Complaint  Patient presents with   Follow-up    Pt states she is still SOB. New inhaler did not help any.   70yF with history of AR, asthma, covid-19 pna - hospitalized in 2021, OSA on autoBiPAP (PS 4, EPAP min 15, IPAP max 25) followed by Dr. Tresa Endo, CAD, BiPD, DM, GERD, HTN, hypothyroid, 60 py smoker quit 2010, chronic systolic heart failure due to NICM referred for chronic hypoxemic respiratory failure on O2 per [place] 2L at rest and 4L with exertion DME supplier [employer] which had been presumed due to OHS.   She says that she had had gradually worsening DOE since covid-19 infection and then over the weekend she had more trouble DOE than typical for her. She has some cough but it is dry. Has had cough for a long time but not especially bothersome to her. She does have orthopnea but this is chronic and she has bed that will elevated for her. She has gained about 30 lb over the last year and 10 lb over the last week.   She is on symbicort - has been on it for about a month. She hasn't noticed that it seems all that helpful for her.   PFT 01/14/22 with moderate restriction, moderately-mildly reduced diffusing capacity. Mildly elevated/normal DL/VA. Very low ERV.  Interval HPI:  Low probability perfusion scan, TTE with poor windows on RV. IVC was dilated without much respiratory change.   Increased lasix and started on breztri. She felt no different after increasing lasix and achieving lower weight of 370lb.   Otherwise pertinent review of systems is negative.  Past Medical History:  Diagnosis Date   Allergic rhinitis    Ambulates with cane    straight   Anemia    Anxiety    Asthma    seasonal   Bipolar affective disorder (HCC)    takes Synthroid meds for Bipolar   CAD (coronary artery disease) 07/2016   by CT scan    Carpal tunnel syndrome    had surgery but occasional still has some issues per patient   Cataract    Centrilobular emphysema (HCC) 07/2016   by CT scan - pt not aware of this   Depression with anxiety    Diabetes mellitus    type 2 - no meds diet controlled   GERD (gastroesophageal reflux disease)    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 - 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 PO   Hyperlipidemia    Hypertension    Hypothyroidism    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 01/16/2018   prevnar 13 - large localized reaction 12/2017   OSA (obstructive sleep apnea)    no longer using cpap, uses a bed that raises and lowers hob   Osteoarthritis    Osteopenia 08/11/2019   DEXA 07/2019 - T -1.1 R femur (osteopenia)   Pneumonia due to COVID-19 virus 05/03/2020   Restless legs    Septic arthritis (HCC) 10/11/2012   Shingles 06/30/2016   Thoracic aortic atherosclerosis (HCC) 11/207   by CT     Family History  Problem Relation Age of Onset   Aneurysm Father 62       brain   Alcohol abuse Father    Cancer Father        possibly   CAD Other  several siblings   Cancer Brother        prostate   Diabetes Brother    Diabetes Sister    Anesthesia problems Neg Hx    Hypotension Neg Hx    Malignant hyperthermia Neg Hx    Pseudochol deficiency Neg Hx    Breast cancer Neg Hx      Past Surgical History:  Procedure Laterality Date   BREAST CYST ASPIRATION     CARPAL TUNNEL RELEASE Right 05/15/2011   CARPAL TUNNEL RELEASE Left 12/24/2017   Procedure: LEFT CARPAL TUNNEL RELEASE;  Surgeon: [name], MD;  Location: [facility];  Service: Orthopedics;  Laterality: Left;   CERVICAL FUSION  2012   C2/3/4   COLONOSCOPY WITH PROPOFOL N/A 12/31/2015   diverticulosis, int hem, o/w normal rpt 10 yrs (Rein)   ESOPHAGOGASTRODUODENOSCOPY (EGD) WITH PROPOFOL N/A 11/12/2019   Procedure: ESOPHAGOGASTRODUODENOSCOPY (EGD) WITH PROPOFOL;  Surgeon: Sherrilyn Rist, MD;  Location: [facility]  ENDOSCOPY;  Service: Gastroenterology;  Laterality: N/A;   EYE SURGERY Bilateral    cataract surgery with lens implant   FOOT SURGERY  1982   bone spur   I & D EXTREMITY  09/06/2012   Budd Palmer, MD; Right;  I&D of right thigh   I & D EXTREMITY Left 07/2013   wound vac - daily doxycycline indefinitely   KNEE ARTHROSCOPY  09/01/2012   Dannielle Huh, MD;  Right   LEFT HEART CATH AND CORONARY ANGIOGRAPHY N/A 11/10/2019   Procedure: LEFT HEART CATH AND CORONARY ANGIOGRAPHY;  Surgeon: Lennette Bihari, MD;  Location: [facility];  Service: Cardiovascular;  Laterality: N/A;   LUMBAR FUSION  01/08/2012   L3-4   LUMBAR FUSION  02/2019   unexpectedly discovered MRSA infection - pus Lovell Sheehan)    LUMBAR LAMINECTOMY/DECOMPRESSION MICRODISCECTOMY N/A 11/13/2016   LAMINOTOMY/LAMINECTOMY LUMBAR FOUR LUMBAR FIVE  WITH RESECTION OF SYNOVIAL CYST;  Surgeon: Tressie Stalker, MD   NECK SURGERY     Herniated disk C2,3,4   NOSE SURGERY     PARTIAL HYSTERECTOMY  1984   for mennorhagia, ovaries remain   REVISION TOTAL HIP ARTHROPLASTY Left 08/28/2011   TMJ ARTHROPLASTY  1982   TOTAL HIP ARTHROPLASTY Left 2002   TRIGGER FINGER RELEASE Right 05/15/2011   long finger    Social History   Socioeconomic History   Marital status: Married    Spouse name: Not on file   Number of children: 3   Years of education: Not on file   Highest education level: Not on file  Occupational History   Occupation: Unemployed    Employer: UNEMPLOYED  Tobacco Use   Smoking status: Former    Packs/day: 1.50    Years: 40.00    Total pack years: 60.00    Types: Cigarettes    Quit date: 04/29/2009    Years since quitting: 13.0   Smokeless tobacco: Never  Vaping Use   Vaping Use: Never used  Substance and Sexual Activity   Alcohol use: Yes    Alcohol/week: 2.0 standard drinks of alcohol    Types: 2 Shots of liquor per week    Comment: 2 shots/weekly   Drug use: No   Sexual activity: Not Currently    Comment:  Hysterectomy  Other Topics Concern   Not on file  Social History Narrative   Married   3 children   Social Determinants of Health   Financial Resource Strain: Low Risk  (12/11/2020)   Overall Financial Resource Strain (  CARDIA)    Difficulty of Paying Living Expenses: Not hard at all  Food Insecurity: No Food Insecurity (12/11/2020)   Hunger Vital Sign    Worried About Running Out of Food in the Last Year: Never true    Ran Out of Food in the Last Year: Never true  Transportation Needs: No Transportation Needs (01/20/2020)   PRAPARE - Administrator, Civil Service (Medical): No    Lack of Transportation (Non-Medical): No  Physical Activity: Inactive (12/11/2020)   Exercise Vital Sign    Days of Exercise per Week: 0 days    Minutes of Exercise per Session: 0 min  Stress: Stress Concern Present (01/20/2020)   [employer] of Occupational Health - Occupational Stress Questionnaire    Feeling of Stress : To some extent  Social Connections: Socially Integrated (12/11/2020)   Social Connection and Isolation Panel [NHANES]    Frequency of Communication with Friends and Family: More than three times a week    Frequency of Social Gatherings with Friends and Family: More than three times a week    Attends Religious Services: More than 4 times per year    Active Member of [employer] or Organizations: Yes    Attends Banker Meetings: Not on file    Marital Status: Married  Intimate Partner Violence: Not At Risk (12/11/2020)   Humiliation, Afraid, Rape, and Kick questionnaire    Fear of Current or Ex-Partner: No    Emotionally Abused: No    Physically Abused: No    Sexually Abused: No     Allergies  Allergen Reactions   Toviaz [Fesoterodine] Nausea And Vomiting    Severe reaction - s/p several ER visits then hospitalization ?stress induced cardiomyopathy   Cephalexin Hives   Hydrocodone Other (See Comments)    Reaction:  Hallucinations    Entresto  [Sacubitril-Valsartan]    Macrodantin [Nitrofurantoin]     Dyspnea, lips peeling   Tolterodine Tartrate Nausea And Vomiting   Risperidone And Related Other (See Comments)    Reaction:  Made pt excessively sleepy   Seroquel [Quetiapine Fumarate] Other (See Comments)    Reaction:  Made pt excessively sleepy   Sulfa Antibiotics Rash    Tolerated bactrim course well 06/2020     Medications reviewed      Objective:   Physical Exam:  General appearance: 71 y.o., female, NAD, conversant  Eyes: anicteric sclerae; PERRL, tracking appropriately HENT: NCAT; MMM Neck: Trachea midline; no lymphadenopathy, no JVD Lungs: diminished bilaterally, no crackles, no wheeze, with normal respiratory effort CV: RRR, no murmur  Abdomen: Soft, non-tender; non-distended, BS present  Extremities: 1-2+ non pitting BLE edema, warm Skin: Normal turgor and texture; no rash Psych: Appropriate affect Neuro: Alert and oriented to person and place, no focal deficit     Vitals:   05/06/22 1628  BP: 124/78  Pulse: 72  Temp: 98.8 F (37.1 C)  TempSrc: Oral  SpO2: 97%  Weight: (!) 369 lb 12.8 oz (167.7 kg)  Height: 5\' 4"  (1.626 m)     97% on 2L O2 BMI Readings from Last 3 Encounters:  05/06/22 63.48 kg/m  04/07/22 67.86 kg/m  03/21/22 67.90 kg/m   Wt Readings from Last 3 Encounters:  05/06/22 (!) 369 lb 12.8 oz (167.7 kg)  04/07/22 (!) 377 lb (171 kg)  03/21/22 (!) 377 lb 4 oz (171.1 kg)     CBC    Component Value Date/Time   WBC 6.3 03/12/2022 0739   RBC 3.94 03/12/2022  0739   HGB 11.7 (L) 03/12/2022 0739   HGB 11.8 (L) 09/25/2012 1729   HCT 35.7 (L) 03/12/2022 0739   HCT 35.9 09/25/2012 1729   PLT 237.0 03/12/2022 0739   PLT 404 09/25/2012 1729   MCV 90.5 03/12/2022 0739   MCV 82 09/25/2012 1729   MCH 28.5 11/23/2020 1701   MCHC 32.9 03/12/2022 0739   RDW 15.3 03/12/2022 0739   RDW 16.8 (H) 09/25/2012 1729   LYMPHSABS 1.7 03/12/2022 0739   MONOABS 0.7 03/12/2022 0739    EOSABS 0.2 03/12/2022 0739   BASOSABS 0.0 03/12/2022 0739    Chest Imaging: LDCT Chest 09/13/20 reviewed by me with bronchial wall thickening, mosaic attenuation (though not in expiration), stable LNs/small nodule  HRCT Chest 02/28/22 with mosaicism vs widespread ggo  V/Q Scan 04/28/22 low probability PE  Pulmonary Functions Testing Results:    Latest Ref Rng & Units 01/14/2022   10:54 AM  PFT Results  FVC-Pre L 1.60   FVC-Predicted Pre % 52   FVC-Post L 1.63   FVC-Predicted Post % 53   Pre FEV1/FVC % % 84   Post FEV1/FCV % % 89   FEV1-Pre L 1.34   FEV1-Predicted Pre % 58   FEV1-Post L 1.45   DLCO uncorrected ml/min/mmHg 11.81   DLCO UNC% % 60   DLVA Predicted % 103   TLC L 3.39   TLC % Predicted % 67   RV % Predicted % 82    REviewed by me with moderate restriction, very low ERV, moderately reduced diffusing capacity with normal KCO    Echocardiogram:  Study Result      ECHOCARDIOGRAM REPORT         Patient Name:   TAURUS WILLIS Vanroekel Date of Exam: 02/20/2020  Medical Rec #:  [id number]     Height:       63.0 in  Accession #:    [id number]    Weight:       380.2 lb  Date of Birth:  06-01-1951      BSA:          2.541 m  Patient Age:    68 years      BP:           128/76 mmHg  Patient Gender: F             HR:           86 bpm.  Exam Location:  [address]   Procedure: 2D Echo, Cardiac Doppler, Color Doppler and Intracardiac             Opacification Agent   Indications:    I42.9 Cardiomyopathy (unspecified)     History:        Patient has prior history of Echocardiogram examinations,  most                  recent 11/09/2019. CAD, COPD, Arrythmias:LBBB; Risk                  Factors:Dyslipidemia, Hypertension and Diabetes. Morbid  obesity.                  NICM. Hypothyroidism. Sleep apnea. Chronic back pain.     Sonographer:    Cathie Beams RCS  Referring Phys: [id number] LUKE K KILROY   IMPRESSIONS     1. Left ventricular ejection fraction, by estimation, is 60  to 65%. The  left ventricle has normal function. The left ventricle has no  regional  wall motion abnormalities. There is moderate left ventricular hypertrophy  of the basal-septal segment. Left  ventricular diastolic parameters are indeterminate. Elevated left  ventricular end-diastolic pressure.   2. Right ventricular systolic function is normal. The right ventricular  size is normal. Tricuspid regurgitation signal is inadequate for assessing  PA pressure.   3. The mitral valve is normal in structure. Trivial mitral valve  regurgitation. No evidence of mitral stenosis.   4. The aortic valve is normal in structure. Aortic valve regurgitation is  not visualized. No aortic stenosis is present.   5. The inferior vena cava is dilated in size with >50% respiratory  variability, suggesting right atrial pressure of 8 mmHg.   6. Compared to echo 10/2019, LVF has normalized.      TTE 04/23/22:  1. Left ventricular ejection fraction, by estimation, is 55 to 60%. The  left ventricle has normal function. The left ventricle has no regional  wall motion abnormalities. There is mild left ventricular hypertrophy.  Left ventricular diastolic parameters  are indeterminate.   2. Right ventricular systolic function was not well visualized. The right  ventricular size is not well visualized.   3. The mitral valve is degenerative. Trivial mitral valve regurgitation.  Severe mitral annular calcification.   4. The aortic valve is tricuspid. Aortic valve regurgitation is not  visualized. No aortic stenosis is present.   5. The inferior vena cava is dilated in size with <50% respiratory  variability, suggesting right atrial pressure of 15 mmHg.   6. Agitated saline contrast bubble study was technically difficult study,  though no clear shunting seen     Assessment & Plan:   # DOE # Restrictive lung disease  # Possible asthma-copd overlap Likely multifactorial - suspect she still has some volume overload  given dilated IVC without resp variation on TTE despite obesity, deconditioning. She says she has history of asthma/COPD - hard to exclude as she was on laba/ics before PFTs last month. With very low ERV her restriction is probably mostly due to obesity - her DLCO is moderately reduced and KCO is not elevated however her CT shows only air trapping without features of fibrotic or inflammatory ILD. V/Q scan without evidence of CTEPH.   # Chronic hypoxemic respiratory failure: On 2L O2 per [place] at rest, 4L with exertion, supplier is [employer]  # OSA on autoBiPAP  (PS 4, EPAP min 15, IPAP max 25) with resmed device. ABG not suggestive of significant chronic hypoventilation.  Plan: - Declines trial of ongoing increased lasix dose - Resume symbicort 160 2 puffs BID with spacer, rinse mouth and spacer after use - Keep up the good work using autoBiPAP   Return to care as needed   Omar Person, MD [facility] 05/06/2022 4:44 PM

## 2022-05-06 ENCOUNTER — Ambulatory Visit (INDEPENDENT_AMBULATORY_CARE_PROVIDER_SITE_OTHER): Payer: Medicare Other | Admitting: Student

## 2022-05-06 ENCOUNTER — Encounter: Payer: Self-pay | Admitting: Student

## 2022-05-06 VITALS — BP 124/78 | HR 72 | Temp 98.8°F | Ht 64.0 in | Wt 369.8 lb

## 2022-05-06 DIAGNOSIS — R0609 Other forms of dyspnea: Secondary | ICD-10-CM | POA: Diagnosis not present

## 2022-05-06 DIAGNOSIS — G4733 Obstructive sleep apnea (adult) (pediatric): Secondary | ICD-10-CM

## 2022-05-06 DIAGNOSIS — J9611 Chronic respiratory failure with hypoxia: Secondary | ICD-10-CM

## 2022-05-06 MED ORDER — BUDESONIDE-FORMOTEROL FUMARATE 160-4.5 MCG/ACT IN AERO: 2.0000 | INHALATION_SPRAY | Freq: Two times a day (BID) | RESPIRATORY_TRACT | 11 refills | Status: DC

## 2022-05-06 NOTE — Patient Instructions (Addendum)
-   Symbicort 2 puffs twice daily, rinse mouth after use - stop breztri - Keep up the good work trying to use autoBiPAP - can discuss increasing lasix if you're having more trouble breathing with PCP, would need BMP (electrolyte panel) within a week of making a change  - Happy to see you again if worsening trouble breathing, cough!

## 2022-05-12 ENCOUNTER — Other Ambulatory Visit (HOSPITAL_BASED_OUTPATIENT_CLINIC_OR_DEPARTMENT_OTHER): Payer: Self-pay

## 2022-05-22 ENCOUNTER — Telehealth: Payer: Self-pay | Admitting: Family Medicine

## 2022-05-22 DIAGNOSIS — Z0279 Encounter for issue of other medical certificate: Secondary | ICD-10-CM

## 2022-05-22 NOTE — Telephone Encounter (Signed)
Patients husband dropped off parking placard form for Dr Reece Agar, I placed in providers folder up front. They would like a call back at [phone] when ready

## 2022-05-22 NOTE — Telephone Encounter (Signed)
Placed form in Dr. G's box.  

## 2022-05-23 NOTE — Telephone Encounter (Signed)
Spoke with pt's husband, Melvyn Neth (on dpr) notifying him the form is ready to pick up and there is a $5.00 fee to have it completed.  Verbalizes understanding and will inform pt.  [Placed form at front office.  Made copy to scan and 1 for billing.]

## 2022-05-23 NOTE — Telephone Encounter (Signed)
Filled and in Lisa's box 

## 2022-06-13 ENCOUNTER — Other Ambulatory Visit: Payer: Self-pay | Admitting: Family Medicine

## 2022-06-13 NOTE — Telephone Encounter (Signed)
Refill request Levothyroxine Last refill 03/17/22 #90 Last office visit 03/21/22 See allergy/contraindiction

## 2022-06-14 NOTE — Telephone Encounter (Signed)
ERx 

## 2022-07-01 ENCOUNTER — Other Ambulatory Visit: Payer: Self-pay | Admitting: Internal Medicine

## 2022-07-01 NOTE — Telephone Encounter (Signed)
Chronic supression

## 2022-07-24 ENCOUNTER — Other Ambulatory Visit: Payer: Self-pay

## 2022-07-24 ENCOUNTER — Telehealth (INDEPENDENT_AMBULATORY_CARE_PROVIDER_SITE_OTHER): Payer: Medicare Other | Admitting: Internal Medicine

## 2022-07-24 DIAGNOSIS — A4902 Methicillin resistant Staphylococcus aureus infection, unspecified site: Secondary | ICD-10-CM

## 2022-07-24 DIAGNOSIS — T847XXD Infection and inflammatory reaction due to other internal orthopedic prosthetic devices, implants and grafts, subsequent encounter: Secondary | ICD-10-CM

## 2022-07-24 DIAGNOSIS — B372 Candidiasis of skin and nail: Secondary | ICD-10-CM | POA: Diagnosis not present

## 2022-07-24 MED ORDER — FLUCONAZOLE 200 MG PO TABS: 200.0000 mg | ORAL_TABLET | Freq: Every day | ORAL | 1 refills | Status: DC

## 2022-07-24 NOTE — Progress Notes (Signed)
Virtual Visit via Video Note  I connected with Amy Barnes on 07/24/22 at  4:00 PM EDT by a video enabled telemedicine application and verified that I am speaking with the correct person using two identifiers.  Location: Patient: at home with daughter Provider: in clinic   I discussed the limitations of evaluation and management by telemedicine and the availability of in person appointments. The patient expressed understanding and agreed to proceed.  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 Has intermittent flair of candidal skin infections for which she does topical antifungal cream.still somewhat bothering her. No breaks in skin or bleeding Observations/Objective: Answer questions appropriately Wearing 2L [place] suppl oxygen at baseline Fluid speech HEENT: MMM, PERRLA, EOMI  Assessment and Plan: Hx of MRSA HW lumbar infection = continue on doxycycline 100mg  bid  Candidal skin infection = appears that it is still persistent despite topical creams to interdiginous areas. Will do a trial of fluconazole 200mg  po daily x 7 days (have stopped temporarily ) then restart after fluconazole is completed; continue with cream to see how it improves. Will give refill x 1 to use if needed with moderate-severe cases  Follow Up Instructions: per above. Will see back in 6 wk to see if improved    I discussed the assessment and treatment plan with the patient. The patient was provided an opportunity to ask questions and all were answered. The patient agreed with the plan and demonstrated an understanding of the instructions.   The patient was advised to call back or seek an in-person evaluation if the symptoms worsen or if the condition fails to improve as anticipated.  I provided 20 minutes of non-face-to-face time during this encounter.   Carlyle Basques, MD

## 2022-08-05 DIAGNOSIS — F3181 Bipolar II disorder: Secondary | ICD-10-CM | POA: Diagnosis not present

## 2022-08-26 ENCOUNTER — Telehealth: Payer: Self-pay | Admitting: Family Medicine

## 2022-08-26 NOTE — Telephone Encounter (Signed)
Patient husband brought in paperwork from [employer] for PCP to fill out and has been placed in PCP folder. Call back number [phone].

## 2022-08-27 NOTE — Telephone Encounter (Signed)
Placed form in Dr. G's box.  

## 2022-08-27 NOTE — Telephone Encounter (Signed)
Filled and in Lisa's box 

## 2022-08-28 NOTE — Telephone Encounter (Signed)
Spoke with pt's husband, Rosanne Ashing (on dpr), notifying him pt's form is completed.  Expresses his thanks and will pick up later today.  [Placed form at front office.]

## 2022-09-03 DIAGNOSIS — H5213 Myopia, bilateral: Secondary | ICD-10-CM | POA: Diagnosis not present

## 2022-09-03 DIAGNOSIS — H04123 Dry eye syndrome of bilateral lacrimal glands: Secondary | ICD-10-CM | POA: Diagnosis not present

## 2022-09-03 DIAGNOSIS — E119 Type 2 diabetes mellitus without complications: Secondary | ICD-10-CM | POA: Diagnosis not present

## 2022-09-03 DIAGNOSIS — H0288A Meibomian gland dysfunction right eye, upper and lower eyelids: Secondary | ICD-10-CM | POA: Diagnosis not present

## 2022-09-03 DIAGNOSIS — Z9842 Cataract extraction status, left eye: Secondary | ICD-10-CM | POA: Diagnosis not present

## 2022-09-03 DIAGNOSIS — H16223 Keratoconjunctivitis sicca, not specified as Sjogren's, bilateral: Secondary | ICD-10-CM | POA: Diagnosis not present

## 2022-09-03 DIAGNOSIS — Z9841 Cataract extraction status, right eye: Secondary | ICD-10-CM | POA: Diagnosis not present

## 2022-09-03 LAB — HM DIABETES EYE EXAM

## 2022-09-11 ENCOUNTER — Other Ambulatory Visit: Payer: Self-pay

## 2022-09-11 ENCOUNTER — Telehealth (INDEPENDENT_AMBULATORY_CARE_PROVIDER_SITE_OTHER): Payer: Medicare Other | Admitting: Internal Medicine

## 2022-09-11 DIAGNOSIS — B372 Candidiasis of skin and nail: Secondary | ICD-10-CM | POA: Diagnosis not present

## 2022-09-11 DIAGNOSIS — A4902 Methicillin resistant Staphylococcus aureus infection, unspecified site: Secondary | ICD-10-CM

## 2022-09-11 MED ORDER — DOXYCYCLINE HYCLATE 100 MG PO TABS
100.0000 mg | ORAL_TABLET | Freq: Two times a day (BID) | ORAL | 1 refills | Status: DC
Start: 2022-09-11 — End: 2022-12-23

## 2022-09-11 NOTE — Progress Notes (Signed)
Patient ID: Amy Barnes, female   DOB: [birth year], 71 y.o.   MRN: [id number] Virtual Visit via Telephone Note  I connected with Amy Barnes on 09/11/22 at  3:30 PM EST by telephone and verified that I am speaking with the correct person using two identifiers.  Location: Patient: at home Provider: at clinic   I discussed the limitations, risks, security and privacy concerns of performing an evaluation and management service by telephone and the availability of in person appointments. I also discussed with the patient that there may be a patient responsible charge related to this service. The patient expressed understanding and agreed to proceed.   History of Present Illness: Doing fairly well. Not having candidal skin infection since weather change. 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 The patient was provided an opportunity to ask questions and all were answered. The patient agreed with the plan and demonstrated an understanding of the instructions.   The patient was advised to call back or seek an in-person evaluation if the symptoms worsen or if the condition fails to improve as anticipated.  I provided 10 minutes of non-face-to-face time during this encounter.   Carlyle Basques, MD   Outpatient Encounter Medications as of 09/11/2022  Medication Sig   albuterol (PROVENTIL HFA;VENTOLIN HFA) 108 (90 Base) MCG/ACT inhaler Inhale 2 puffs into the lungs every 6 (six) hours as needed for wheezing or shortness of breath.   albuterol (PROVENTIL) (2.5 MG/3ML) 0.083% nebulizer solution Take 3 mLs  (2.5 mg total) by nebulization every 6 (six) hours as needed for wheezing or shortness of breath.   albuterol (PROVENTIL) (5 MG/ML) 0.5% nebulizer solution Take 0.5 mLs (2.5 mg total) by nebulization every 4 (four) hours as needed for wheezing or shortness of breath.   aspirin 81 MG EC tablet Take 81 mg by mouth at bedtime.   budesonide-formoterol (SYMBICORT) 160-4.5 MCG/ACT inhaler Inhale 2 puffs into the lungs 2 (two) times daily.   carvedilol (COREG) 6.25 MG tablet Take 1 tablet (6.25 mg total) by mouth 2 (two) times daily.   Cholecalciferol (VITAMIN D) 50 MCG (2000 UT) CAPS Take 1 capsule (2,000 Units total) by mouth daily.   dicyclomine (BENTYL) 20 MG tablet Take 1 tablet (20 mg total) by mouth in the morning and at bedtime. (Patient taking differently: Take 20 mg by mouth in the morning and at bedtime. As needed)   doxycycline (VIBRA-TABS) 100 MG tablet TAKE ONE TABLET BY MOUTH TWICE A DAY   ferrous sulfate 325 (65 FE) MG EC tablet Take 1 tablet (325 mg total) by mouth every Monday, Wednesday, and Friday.   fluconazole (DIFLUCAN) 200 MG tablet Take 1 tablet (200 mg total) by mouth daily. X 7 days in addition to topical cream for candidal skin infeciton   furosemide (LASIX) 20 MG tablet Take 2 tablets (40 mg total) by mouth 2 (two) times daily as needed (as directed).   gabapentin (NEURONTIN) 100 MG capsule TAKE 1-2 CAPSULES BY MOUTH AT BEDTIME   glimepiride (AMARYL) 1 MG tablet Take 1 tablet (1 mg total) by mouth daily with  breakfast.   lamoTRIgine (LAMICTAL) 200 MG tablet Take 400 mg by mouth daily.   levothyroxine (SYNTHROID) 150 MCG tablet TAKE 1 TABLET BY MOUTH AT BEDTIME   montelukast (SINGULAIR) 10 MG tablet TAKE ONE TABLET BY MOUTH EVERY NIGHT AT BEDTIME   Multiple Vitamin (MULTIVITAMIN WITH MINERALS) TABS tablet Take 2 tablets by mouth daily.    ondansetron (ZOFRAN ODT) 8 MG disintegrating tablet Take 1 tablet (8 mg total) by mouth 2 (two) times daily as needed for nausea or vomiting.    OZEMPIC, 0.25 OR 0.5 MG/DOSE, 2 MG/3ML SOPN Inject 0.5 mg into the skin every 7 (seven) days.   pantoprazole (PROTONIX) 40 MG tablet TAKE ONE TABLET BY MOUTH TWICE A DAY   polyethylene glycol (MIRALAX / GLYCOLAX) 17 g packet Take 17 g by mouth 2 (two) times daily as needed for mild constipation.   potassium chloride (KLOR-CON) 10 MEQ tablet Take 1 tablet (10 mEq total) by mouth daily. With lasix. May take an additional 10 meq when taking 60 meq of lasix   prochlorperazine (COMPAZINE) 10 MG tablet Take 1 tablet (10 mg total) by mouth every 6 (six) hours as needed for nausea or vomiting.   promethazine (PHENERGAN) 25 MG suppository Place 1 suppository (25 mg total) rectally every 6 (six) hours as needed for nausea or vomiting.   PROZAC 20 MG capsule Take 60 mg by mouth daily.   simvastatin (ZOCOR) 40 MG tablet TAKE ONE TABLET BY MOUTH EVERY EVENING   sucralfate (CARAFATE) 1 GM/10ML suspension Take 10 mLs (1 g total) by mouth 4 (four) times daily -  with meals and at bedtime. (Patient taking differently: Take 1 g by mouth 4 (four) times daily -  with meals and at bedtime. Pt takes as needed.)   Facility-Administered Encounter Medications as of 09/11/2022  Medication   sodium chloride flush (NS) 0.9 % injection 10 mL     Patient Active Problem List   Diagnosis Date Noted   Restrictive lung disease 01/20/2022   Medicare annual wellness visit, subsequent 02/09/2021   DNR (do not resuscitate) 05/31/2020   Chronic respiratory failure with hypoxia, on home O2 therapy ([place]) 05/04/2020   Obesity hypoventilation syndrome ([place]) 05/04/2020   LVH (left ventricular hypertrophy) 05/04/2020   Pneumonia due to COVID-19 virus 05/03/2020   Non-ischemic cardiomyopathy ([place]) 12/29/2019   HFrEF (heart failure with reduced ejection fraction) ([place]) 11/25/2019   UTI (urinary tract infection) 11/25/2019   NSTEMI (non-ST elevated myocardial infarction) ([place]) 11/09/2019   QT prolongation 11/09/2019   Nausea and  vomiting 11/03/2019   Diminished pulses in lower extremity 08/11/2019   Osteopenia 08/11/2019   Wound infection complicating hardware ([place]) 04/01/2019   Lumbar adjacent segment disease with spondylolisthesis 03/23/2019   Neurogenic urinary incontinence 11/29/2018   Urinary incontinence 11/29/2018   Bladder spasms 11/29/2018   Discogenic low back pain 11/29/2018   Chronic sacroiliac joint pain (Bilateral) (R>L) 11/29/2018   Spondylosis without myelopathy or radiculopathy, lumbar region 11/09/2018   Chronic low back pain (Bilateral) (L>R) w/o sciatica 11/09/2018   History of hip replacement (Left) 10/13/2018   History of back surgery 10/13/2018   Vitamin D insufficiency 10/13/2018   Osteoarthritis of facet joint of lumbar spine 10/13/2018   Osteoarthritis involving multiple joints 10/13/2018   Cervical radiculopathy (Left) 10/13/2018   Lumbar facet arthropathy (Bilateral) 10/13/2018   Lumbar facet syndrome (Bilateral) (L>R) 10/13/2018   Abnormal MRI, lumbar spine (09/16/2018) 10/13/2018   Lumbar nerve root compression (L4) (Left) 10/13/2018   Lumbar foraminal stenosis  10/13/2018   Chronic hip pain after total replacement x 3 (Left) 10/13/2018   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   Chronic low back pain (Primary Area of Pain) (Bilateral) (L>R) w/ sciatica (Left) 09/14/2018   Chronic lower extremity pain (Secondary Area of Pain) (Left) 09/14/2018   Chronic hip pain (Tertiary Area of Pain) (Bilateral) (L>R) 09/14/2018   Chronic knee pain (Fourth Area of Pain) (Bilateral) (R>L) 09/14/2018   Polypharmacy 09/14/2018   Disorder of skeletal system 09/14/2018   Problems influencing health status 09/14/2018   Primary osteoarthritis of first carpometacarpal joint of left hand 04/20/2018   PAH (pulmonary artery hypertension) (HCC) 08/03/2017   Advanced care planning/counseling discussion 06/25/2017   Synovial cyst of lumbar facet joint 11/13/2016   CAD (coronary artery disease) 07/30/2016    Centrilobular emphysema (HCC) 07/30/2016   Ex-smoker 07/08/2016   Nonspecific abnormal electrocardiogram (ECG) (EKG) 07/08/2016   Chronic lumbar radicular pain (L4) (Bilateral) 06/09/2016   DOE (dyspnea on exertion) 05/22/2016   Degenerative joint disease (DJD) of hip 04/17/2016   Somatic dysfunction of sacroiliac joint 04/17/2016   S/P Lumbar Fusion (L3-4 PLIF) 04/17/2016   Cervical fusion syndrome 04/17/2016   Pedal edema 02/19/2016   Chronic pain syndrome 02/19/2016   Aortic atherosclerosis (HCC) 12/11/2015   MRSA (methicillin resistant Staphylococcus aureus) infection 10/11/2012   Infection or inflammatory reaction due to internal joint prosthesis (HCC) 12/03/2011   Morbid obesity with BMI of 60.0-69.9, adult (HCC) 05/13/2011   Essential hypertension 05/17/2010   Cervical radiculopathy (Right) 12/25/2009   Benign positional vertigo 07/21/2008   Hyperlipidemia associated with type 2 diabetes mellitus (HCC) 08/16/2007   Hypothyroidism 07/30/2007   Type 2 diabetes mellitus with peripheral neuropathy (HCC) 07/30/2007   Normocytic anemia 07/30/2007   Bipolar disorder (HCC) 07/30/2007   Allergic rhinitis 07/30/2007   Asthma 07/30/2007   GERD 07/30/2007   Osteoarthritis 07/30/2007   OSA treated with BiPAP 07/30/2007     Health Maintenance Due  Topic Date Due   COVID-19 Vaccine (1) Never done   Zoster Vaccines- Shingrix (1 of 2) Never done   DTaP/Tdap/Td (2 - Tdap) 06/29/2017   MAMMOGRAM  08/09/2020   FOOT EXAM  08/10/2021   INFLUENZA VACCINE  04/29/2022   HEMOGLOBIN A1C  09/11/2022     Review of Systems  Physical Exam   LMP  (LMP Unknown)    No results found for: "CD4TCELL" No results found for: "CD4TABS" No results found for: "HIV1RNAQUANT" No results found for: "HEPBSAB" No results found for: "RPR", "LABRPR"  CBC Lab Results  Component Value Date   WBC 6.3 03/12/2022   RBC 3.94 03/12/2022   HGB 11.7 (L) 03/12/2022   HCT 35.7 (L) 03/12/2022   PLT 237.0  03/12/2022   MCV 90.5 03/12/2022   MCH 28.5 11/23/2020   MCHC 32.9 03/12/2022   RDW 15.3 03/12/2022   LYMPHSABS 1.7 03/12/2022   MONOABS 0.7 03/12/2022   EOSABS 0.2 03/12/2022    BMET Lab Results  Component Value Date   NA 140 03/12/2022   K 4.5 03/12/2022   CL 100 03/12/2022   CO2 31 03/12/2022   GLUCOSE 166 (H) 03/12/2022   BUN 20 03/12/2022   CREATININE 0.76 03/12/2022   CALCIUM 9.6 03/12/2022   GFRNONAA >60 05/22/2020   GFRAA >60 05/22/2020      Assessment and Plan

## 2022-09-18 ENCOUNTER — Other Ambulatory Visit: Payer: Self-pay | Admitting: Family Medicine

## 2022-09-18 DIAGNOSIS — E1142 Type 2 diabetes mellitus with diabetic polyneuropathy: Secondary | ICD-10-CM

## 2022-09-19 ENCOUNTER — Other Ambulatory Visit (INDEPENDENT_AMBULATORY_CARE_PROVIDER_SITE_OTHER): Payer: Medicare Other

## 2022-09-19 DIAGNOSIS — E1142 Type 2 diabetes mellitus with diabetic polyneuropathy: Secondary | ICD-10-CM

## 2022-09-19 LAB — CBC WITH DIFFERENTIAL/PLATELET
Basophils Absolute: 0.1 10*3/uL (ref 0.0–0.1)
Basophils Relative: 0.8 % (ref 0.0–3.0)
Eosinophils Absolute: 0.2 10*3/uL (ref 0.0–0.7)
Eosinophils Relative: 2.8 % (ref 0.0–5.0)
HCT: 38.3 % (ref 36.0–46.0)
Hemoglobin: 12.5 g/dL (ref 12.0–15.0)
Lymphocytes Relative: 19.7 % (ref 12.0–46.0)
Lymphs Abs: 1.3 10*3/uL (ref 0.7–4.0)
MCHC: 32.6 g/dL (ref 30.0–36.0)
MCV: 88.4 fl (ref 78.0–100.0)
Monocytes Absolute: 0.5 10*3/uL (ref 0.1–1.0)
Monocytes Relative: 8.4 % (ref 3.0–12.0)
Neutro Abs: 4.4 10*3/uL (ref 1.4–7.7)
Neutrophils Relative %: 68.3 % (ref 43.0–77.0)
Platelets: 246 10*3/uL (ref 150.0–400.0)
RBC: 4.33 Mil/uL (ref 3.87–5.11)
RDW: 15.6 % — ABNORMAL HIGH (ref 11.5–15.5)
WBC: 6.4 10*3/uL (ref 4.0–10.5)

## 2022-09-19 LAB — BASIC METABOLIC PANEL
BUN: 16 mg/dL (ref 6–23)
CO2: 29 mEq/L (ref 19–32)
Calcium: 9.6 mg/dL (ref 8.4–10.5)
Chloride: 99 mEq/L (ref 96–112)
Creatinine, Ser: 0.62 mg/dL (ref 0.40–1.20)
GFR: 90.31 mL/min (ref >60.00)
Glucose, Bld: 203 mg/dL — ABNORMAL HIGH (ref 70–99)
Potassium: 4.1 mEq/L (ref 3.5–5.1)
Sodium: 139 mEq/L (ref 135–145)

## 2022-09-19 LAB — HEMOGLOBIN A1C: Hgb A1c MFr Bld: 6.8 % — ABNORMAL HIGH (ref 4.6–6.5)

## 2022-09-26 ENCOUNTER — Encounter: Payer: Self-pay | Admitting: Family Medicine

## 2022-09-26 ENCOUNTER — Ambulatory Visit (INDEPENDENT_AMBULATORY_CARE_PROVIDER_SITE_OTHER): Payer: Medicare Other | Admitting: Family Medicine

## 2022-09-26 VITALS — BP 134/76 | HR 71 | Temp 97.2°F | Ht 64.0 in

## 2022-09-26 DIAGNOSIS — I5020 Unspecified systolic (congestive) heart failure: Secondary | ICD-10-CM

## 2022-09-26 DIAGNOSIS — E1142 Type 2 diabetes mellitus with diabetic polyneuropathy: Secondary | ICD-10-CM

## 2022-09-26 DIAGNOSIS — H8111 Benign paroxysmal vertigo, right ear: Secondary | ICD-10-CM | POA: Diagnosis not present

## 2022-09-26 DIAGNOSIS — Z23 Encounter for immunization: Secondary | ICD-10-CM | POA: Diagnosis not present

## 2022-09-26 DIAGNOSIS — Z9981 Dependence on supplemental oxygen: Secondary | ICD-10-CM

## 2022-09-26 DIAGNOSIS — J9611 Chronic respiratory failure with hypoxia: Secondary | ICD-10-CM | POA: Diagnosis not present

## 2022-09-26 NOTE — Patient Instructions (Addendum)
Flu shot today A1c is doing great today! Continue ozempic and amaryl.  Try repositioning maneuvers for possible positional vertigo. If not helpful or worsening let me know, we may send you to PT.  Return in 6 months for medicare wellness visit.

## 2022-09-26 NOTE — Progress Notes (Unsigned)
Patient ID: Amy Barnes, female    DOB: March 06, 1951, 71 y.o.   MRN: [id number]  This visit was conducted in person.  BP 134/76   Pulse 71   Temp (!) 97.2 F (36.2 C) (Temporal)   Ht 5\' 4"  (1.626 m)   LMP  (LMP Unknown)   SpO2 99% Comment: 3 L  BMI 63.48 kg/m    CC: 6 mo DM f/u visit  Subjective:   HPI: Amy Barnes is a 71 y.o. female presenting on 09/26/2022 for Diabetes (Here for 6 mo f/u. Pt accompanied by daughter, 09/28/2022 and husband, Amy Barnes. )   Noticing more easy increasing dyspnea with exertion over the past 1 month. Yesterday raised O2 to 4L. No fevers/chills, cough. No change in weight noted, leg swelling.   H/o MRSA hardware infection followed by ID Amy Barnes) on doxycycline 100mg  daily for chronic infection suppression.   Exertional dyspnea - seeing pulm in known restrictive lung disease, chronic hypoxemic resp failure, OSA on autoBiAP, possible asthma/COPD overlap.   HFrEF followed by cardiology (Amy Barnes) on lasix 40mg  BID PRN with potassium.   Notes dizziness when supine as well as when standing up. Describes "room spinning" dizziness to both. No presyncope, syncope. No hearing changes, ringing in ears, no recent respiratory infections. No h/o vertigo in the past.   DM - does not regularly check sugars. Compliant with antihyperglycemic regimen which includes: amaryl 1mg  daily, ozempic 0.5mg  weekly. No nausea, constipation, epigastric pain. Metformin may have worsened leg swelling. Denies low sugars or hypoglycemic symptoms. Denies paresthesias. Blurry vision saw eye doctor with new glasses and dry eye drops. Last diabetic eye exam 12/2021. Again saw 3 wks ago. She feels CPAP mask is causing dry eyes. Glucometer brand: [employer] brand (thinks one touch). Last foot exam: 07/2020 - DUE. DSME: 02/2019 at [facility].  Lab Results  Component Value Date   HGBA1C 6.8 (H) 09/19/2022   Diabetic Foot Exam - Simple   No data filed    Lab Results  Component Value Date   MICROALBUR 2.6 (H)  03/13/2022         Relevant past medical, surgical, family and social history reviewed and updated as indicated. Interim medical history since our last visit reviewed. Allergies and medications reviewed and updated. Outpatient Medications Prior to Visit  Medication Sig Dispense Refill   albuterol (PROVENTIL HFA;VENTOLIN HFA) 108 (90 Base) MCG/ACT inhaler Inhale 2 puffs into the lungs every 6 (six) hours as needed for wheezing or shortness of breath. 1 Inhaler 6   albuterol (PROVENTIL) (2.5 MG/3ML) 0.083% nebulizer solution Take 3 mLs (2.5 mg total) by nebulization every 6 (six) hours as needed for wheezing or shortness of breath. 75 mL 12   albuterol (PROVENTIL) (5 MG/ML) 0.5% nebulizer solution Take 0.5 mLs (2.5 mg total) by nebulization every 4 (four) hours as needed for wheezing or shortness of breath. 20 mL 6   aspirin 81 MG EC tablet Take 81 mg by mouth at bedtime.     budesonide-formoterol (SYMBICORT) 160-4.5 MCG/ACT inhaler Inhale 2 puffs into the lungs 2 (two) times daily. 1 each 11   carvedilol (COREG) 6.25 MG tablet Take 1 tablet (6.25 mg total) by mouth 2 (two) times daily. 180 tablet 3   Cholecalciferol (VITAMIN D) 50 MCG (2000 UT) CAPS Take 1 capsule (2,000 Units total) by mouth daily. 30 capsule    cycloSPORINE (RESTASIS) 0.05 % ophthalmic emulsion Place 1 drop into both eyes 2 (two) times daily.     dicyclomine (BENTYL)  20 MG tablet Take 1 tablet (20 mg total) by mouth in the morning and at bedtime. (Patient taking differently: Take 20 mg by mouth in the morning and at bedtime. As needed) 60 tablet 2   doxycycline (VIBRA-TABS) 100 MG tablet Take 1 tablet (100 mg total) by mouth 2 (two) times daily. 180 tablet 1   ferrous sulfate 325 (65 FE) MG EC tablet Take 1 tablet (325 mg total) by mouth every Monday, Wednesday, and Friday.     fluconazole (DIFLUCAN) 200 MG tablet Take 1 tablet (200 mg total) by mouth daily. X 7 days in addition to topical cream for candidal skin infeciton 14  tablet 1   furosemide (LASIX) 20 MG tablet Take 2 tablets (40 mg total) by mouth 2 (two) times daily as needed (as directed).     gabapentin (NEURONTIN) 100 MG capsule TAKE 1-2 CAPSULES BY MOUTH AT BEDTIME 60 capsule 6   glimepiride (AMARYL) 1 MG tablet Take 1 tablet (1 mg total) by mouth daily with breakfast. 30 tablet 9   lamoTRIgine (LAMICTAL) 200 MG tablet Take 400 mg by mouth daily.     levothyroxine (SYNTHROID) 150 MCG tablet TAKE 1 TABLET BY MOUTH AT BEDTIME 90 tablet 1   montelukast (SINGULAIR) 10 MG tablet TAKE ONE TABLET BY MOUTH EVERY NIGHT AT BEDTIME 90 tablet 3   Multiple Vitamin (MULTIVITAMIN WITH MINERALS) TABS tablet Take 2 tablets by mouth daily.      ondansetron (ZOFRAN ODT) 8 MG disintegrating tablet Take 1 tablet (8 mg total) by mouth 2 (two) times daily as needed for nausea or vomiting. 10 tablet 0   OZEMPIC, 0.25 OR 0.5 MG/DOSE, 2 MG/3ML SOPN Inject 0.5 mg into the skin every 7 (seven) days. 9 mL 3   pantoprazole (PROTONIX) 40 MG tablet TAKE ONE TABLET BY MOUTH TWICE A DAY 180 tablet 1   polyethylene glycol (MIRALAX / GLYCOLAX) 17 g packet Take 17 g by mouth 2 (two) times daily as needed for mild constipation. 14 each 0   potassium chloride (KLOR-CON) 10 MEQ tablet Take 1 tablet (10 mEq total) by mouth daily. With lasix. May take an additional 10 meq when taking 60 meq of lasix 40 tablet 6   prochlorperazine (COMPAZINE) 10 MG tablet Take 1 tablet (10 mg total) by mouth every 6 (six) hours as needed for nausea or vomiting. 30 tablet 1   promethazine (PHENERGAN) 25 MG suppository Place 1 suppository (25 mg total) rectally every 6 (six) hours as needed for nausea or vomiting. 12 each 0   PROZAC 20 MG capsule Take 60 mg by mouth daily.     simvastatin (ZOCOR) 40 MG tablet TAKE ONE TABLET BY MOUTH EVERY EVENING 90 tablet 3   sucralfate (CARAFATE) 1 GM/10ML suspension Take 10 mLs (1 g total) by mouth 4 (four) times daily -  with meals and at bedtime. (Patient taking differently: Take  1 g by mouth 4 (four) times daily -  with meals and at bedtime. Pt takes as needed.) 420 mL 0   Facility-Administered Medications Prior to Visit  Medication Dose Route Frequency Provider Last Rate Last Admin   sodium chloride flush (NS) 0.9 % injection 10 mL  10 mL Intravenous PRN Omar Person, MD   20 mL at 04/23/22 1010     Per HPI unless specifically indicated in ROS section below Review of Systems  Objective:  BP 134/76   Pulse 71   Temp (!) 97.2 F (36.2 C) (Temporal)   Ht 5'  4" (1.626 m)   LMP  (LMP Unknown)   SpO2 99% Comment: 3 L  BMI 63.48 kg/m   Wt Readings from Last 3 Encounters:  05/06/22 (!) 369 lb 12.8 oz (167.7 kg)  04/07/22 (!) 377 lb (171 kg)  03/21/22 (!) 377 lb 4 oz (171.1 kg)      Physical Exam Vitals and nursing note reviewed.  Constitutional:      Appearance: Normal appearance. She is obese. She is ill-appearing (chronic).     Comments: Sitting in wheelchair, supplemental oxygen via [place] at 3L  HENT:     Head: Normocephalic and atraumatic.     Right Ear: Hearing, tympanic membrane, ear canal and external ear normal.     Left Ear: Hearing, tympanic membrane, ear canal and external ear normal.  Cardiovascular:     Rate and Rhythm: Normal rate and regular rhythm.     Pulses: Normal pulses.     Heart sounds: Normal heart sounds. No murmur heard. Pulmonary:     Effort: Pulmonary effort is normal. No respiratory distress.     Breath sounds: Normal breath sounds. No wheezing, rhonchi or rales.  Musculoskeletal:        General: Swelling present.  Skin:    General: Skin is warm and dry.     Findings: No rash.  Neurological:     Mental Status: She is alert.     Cranial Nerves: Cranial nerves 2-12 are intact.     Coordination: Finger-Nose-Finger Test normal.     Comments:  CN 2-12 intact FTN intact EOMI  Psychiatric:        Mood and Affect: Mood normal.        Behavior: Behavior normal.       Results for orders placed or performed in visit  on 09/19/22  Hemoglobin A1c  Result Value Ref Range   Hgb A1c MFr Bld 6.8 (H) 4.6 - 6.5 %  Basic metabolic panel  Result Value Ref Range   Sodium 139 135 - 145 mEq/L   Potassium 4.1 3.5 - 5.1 mEq/L   Chloride 99 96 - 112 mEq/L   CO2 29 19 - 32 mEq/L   Glucose, Bld 203 (H) 70 - 99 mg/dL   BUN 16 6 - 23 mg/dL   Creatinine, Ser 2.37 0.40 - 1.20 mg/dL   GFR 62.83 >15.17 mL/min   Calcium 9.6 8.4 - 10.5 mg/dL  CBC with Differential/Platelet  Result Value Ref Range   WBC 6.4 4.0 - 10.5 K/uL   RBC 4.33 3.87 - 5.11 Mil/uL   Hemoglobin 12.5 12.0 - 15.0 g/dL   HCT 61.6 07.3 - 71.0 %   MCV 88.4 78.0 - 100.0 fl   MCHC 32.6 30.0 - 36.0 g/dL   RDW 62.6 (H) 94.8 - 54.6 %   Platelets 246.0 150.0 - 400.0 K/uL   Neutrophils Relative % 68.3 43.0 - 77.0 %   Lymphocytes Relative 19.7 12.0 - 46.0 %   Monocytes Relative 8.4 3.0 - 12.0 %   Eosinophils Relative 2.8 0.0 - 5.0 %   Basophils Relative 0.8 0.0 - 3.0 %   Neutro Abs 4.4 1.4 - 7.7 K/uL   Lymphs Abs 1.3 0.7 - 4.0 K/uL   Monocytes Absolute 0.5 0.1 - 1.0 K/uL   Eosinophils Absolute 0.2 0.0 - 0.7 K/uL   Basophils Absolute 0.1 0.0 - 0.1 K/uL    Assessment & Plan:   Problem List Items Addressed This Visit     Type 2 diabetes mellitus with peripheral  neuropathy (HCC) - Primary   Other Visit Diagnoses     Need for influenza vaccination       Relevant Orders   Flu Vaccine QUAD High Dose(Fluad) (Completed)        No orders of the defined types were placed in this encounter.  Orders Placed This Encounter  Procedures   Flu Vaccine QUAD High Dose(Fluad)     Patient Instructions  Flu shot today A1c is doing great today! Continue ozempic and amaryl.  Try repositioning maneuvers for possible positional vertigo. If not helpful or worsening let me know, we may send you to PT.  Return in 6 months for medicare wellness visit.  Follow up plan: Return in about 6 months (around 03/28/2023) for medicare wellness visit.  [name],  MD

## 2022-09-29 DIAGNOSIS — R42 Dizziness and giddiness: Secondary | ICD-10-CM | POA: Insufficient documentation

## 2022-09-29 NOTE — Assessment & Plan Note (Signed)
Stable period, continues daily lasix.

## 2022-09-29 NOTE — Assessment & Plan Note (Deleted)
Story most consistent with benign peripheral positional vertigo given duration of symptoms and lack of central vertigo symptoms, predominantly when turning head or turning over to the right sid. Reassurance provided. Forgot to provide with canalith repositioning maneuvers - will mail to patient.

## 2022-09-29 NOTE — Assessment & Plan Note (Addendum)
Story most consistent with benign peripheral positional vertigo given duration of symptoms and lack of central vertigo symptoms, predominantly when turning head or turning over to the right sid. Reassurance provided. Forgot to provide with canalith repositioning maneuvers - will mail to patient. Discussed vestibular rehab if home exercises aren't beneficial.

## 2022-09-29 NOTE — Assessment & Plan Note (Addendum)
Chronic, continued improvement in A1c with best control to date on ozempic and amaryl - continue this. Tolerating weekly injection well. She is not checking sugars.  Reviewed recent labwork.

## 2022-09-29 NOTE — Assessment & Plan Note (Addendum)
Chronic, ongoing, multifactorial, notes possibly worsening - she is followed by pulmonology and cardiology.

## 2022-10-11 ENCOUNTER — Other Ambulatory Visit: Payer: Self-pay | Admitting: Family Medicine

## 2022-10-11 DIAGNOSIS — E1142 Type 2 diabetes mellitus with diabetic polyneuropathy: Secondary | ICD-10-CM

## 2022-11-04 ENCOUNTER — Encounter: Payer: Self-pay | Admitting: Internal Medicine

## 2022-11-06 ENCOUNTER — Other Ambulatory Visit: Payer: Self-pay

## 2022-11-06 ENCOUNTER — Ambulatory Visit (INDEPENDENT_AMBULATORY_CARE_PROVIDER_SITE_OTHER): Payer: Medicare Other | Admitting: Internal Medicine

## 2022-11-06 VITALS — BP 147/83 | HR 74 | Temp 97.5°F

## 2022-11-06 DIAGNOSIS — A4902 Methicillin resistant Staphylococcus aureus infection, unspecified site: Secondary | ICD-10-CM | POA: Diagnosis not present

## 2022-11-06 MED ORDER — FLUCONAZOLE 200 MG PO TABS: 200.0000 mg | ORAL_TABLET | Freq: Every day | ORAL | 1 refills | Status: DC

## 2022-11-06 MED ORDER — NYSTATIN 100000 UNIT/GM EX CREA: 1.0000 | TOPICAL_CREAM | Freq: Two times a day (BID) | CUTANEOUS | 0 refills | Status: AC

## 2022-11-07 LAB — CBC WITH DIFFERENTIAL/PLATELET
Absolute Monocytes: 598 cells/uL (ref 200–950)
Basophils Absolute: 27 cells/uL (ref 0–200)
Basophils Relative: 0.4 %
Eosinophils Absolute: 177 cells/uL (ref 15–500)
Eosinophils Relative: 2.6 %
HCT: 38.4 % (ref 35.0–45.0)
Hemoglobin: 12.8 g/dL (ref 11.7–15.5)
Lymphs Abs: 1088 cells/uL (ref 850–3900)
MCH: 29.6 pg (ref 27.0–33.0)
MCHC: 33.3 g/dL (ref 32.0–36.0)
MCV: 88.7 fL (ref 80.0–100.0)
MPV: 11.3 fL (ref 7.5–12.5)
Monocytes Relative: 8.8 %
Neutro Abs: 4910 cells/uL (ref 1500–7800)
Neutrophils Relative %: 72.2 %
Platelets: 267 10*3/uL (ref 140–400)
RBC: 4.33 10*6/uL (ref 3.80–5.10)
RDW: 13.9 % (ref 11.0–15.0)
Total Lymphocyte: 16 %
WBC: 6.8 10*3/uL (ref 3.8–10.8)

## 2022-11-07 LAB — BASIC METABOLIC PANEL
BUN: 17 mg/dL (ref 7–25)
CO2: 28 mmol/L (ref 20–32)
Calcium: 9.9 mg/dL (ref 8.6–10.4)
Chloride: 101 mmol/L (ref 98–110)
Creat: 0.7 mg/dL (ref 0.60–1.00)
Glucose, Bld: 127 mg/dL — ABNORMAL HIGH (ref 65–99)
Potassium: 4.4 mmol/L (ref 3.5–5.3)
Sodium: 141 mmol/L (ref 135–146)

## 2022-11-07 LAB — SEDIMENTATION RATE: Sed Rate: 28 mm/h (ref 0–30)

## 2022-11-07 LAB — C-REACTIVE PROTEIN: CRP: 19.6 mg/L — ABNORMAL HIGH (ref <8.0)

## 2022-11-07 NOTE — Progress Notes (Signed)
RFV: new rash   Patient ID: Amy Barnes, female   DOB: 1951/08/16, 72 y.o.   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 She noticed erythema with possible moisture to middle of her back and previous incision site. Has had repeat photos showing progression. Came clinic for evaluation.  Outpatient Encounter Medications as of 11/06/2022  Medication Sig   albuterol (PROVENTIL HFA;VENTOLIN HFA) 108 (90 Base) MCG/ACT inhaler Inhale 2 puffs into the lungs every 6 (six) hours as needed for wheezing or shortness of breath.   albuterol (PROVENTIL) (2.5 MG/3ML) 0.083% nebulizer solution Take 3 mLs (2.5 mg total) by nebulization every 6 (six) hours as needed for wheezing or shortness of breath.   albuterol (PROVENTIL) (5 MG/ML) 0.5% nebulizer solution Take 0.5 mLs (2.5 mg total) by nebulization every 4 (four) hours as needed for wheezing or shortness of breath.   aspirin 81 MG EC tablet Take 81 mg by mouth at bedtime.   budesonide-formoterol (SYMBICORT) 160-4.5 MCG/ACT inhaler Inhale 2 puffs into the lungs 2 (two) times daily.   carvedilol (COREG) 6.25 MG tablet Take 1 tablet (6.25 mg total) by mouth 2 (two) times daily.   Cholecalciferol (VITAMIN D) 50 MCG (2000 UT) CAPS Take 1 capsule (2,000 Units total) by mouth daily.   cycloSPORINE (RESTASIS) 0.05 % ophthalmic emulsion Place 1 drop into both eyes 2 (two) times daily.   dicyclomine (BENTYL) 20 MG tablet Take 1 tablet (20 mg total) by mouth in the morning and at bedtime. (Patient taking differently: Take 20 mg by mouth in the morning and at bedtime. As needed)   doxycycline (VIBRA-TABS) 100 MG tablet Take 1 tablet (100 mg total) by mouth 2 (two) times daily.   ferrous sulfate 325 (65 FE) MG EC tablet Take 1 tablet (325 mg total) by mouth every Monday, Wednesday, and Friday.   furosemide (LASIX) 20 MG tablet Take 2 tablets (40 mg total) by  mouth 2 (two) times daily as needed (as directed).   gabapentin (NEURONTIN) 100 MG capsule TAKE 1-2 CAPSULES BY MOUTH AT BEDTIME   glimepiride (AMARYL) 1 MG tablet TAKE ONE TABLET BY MOUTH DAILY WITH BREAKFAST   lamoTRIgine (LAMICTAL) 200 MG tablet Take 400 mg by mouth daily.   levothyroxine (SYNTHROID) 150 MCG tablet TAKE 1 TABLET BY MOUTH AT BEDTIME   montelukast (SINGULAIR) 10 MG tablet TAKE ONE TABLET BY MOUTH EVERY NIGHT AT BEDTIME   Multiple Vitamin (MULTIVITAMIN WITH MINERALS) TABS tablet Take 2 tablets by mouth daily.    nystatin cream (MYCOSTATIN) Apply 1 Application topically 2 (two) times daily.   OZEMPIC, 0.25 OR 0.5 MG/DOSE, 2 MG/3ML SOPN Inject 0.5 mg into the skin every 7 (seven) days.   pantoprazole (PROTONIX) 40 MG tablet TAKE ONE TABLET BY MOUTH TWICE A DAY   polyethylene glycol (MIRALAX / GLYCOLAX) 17 g packet Take 17 g by mouth 2 (two) times daily as needed for mild constipation.   potassium chloride (KLOR-CON) 10 MEQ tablet Take 1 tablet (10 mEq total) by mouth daily. With lasix. May take an additional 10 meq when taking 60 meq of lasix   prochlorperazine (COMPAZINE) 10 MG tablet Take 1 tablet (10 mg total) by mouth every 6 (six) hours as needed for nausea or vomiting.   PROZAC 20 MG capsule Take 60 mg by mouth daily.   simvastatin (ZOCOR) 40 MG tablet TAKE ONE TABLET BY MOUTH EVERY EVENING   sucralfate (CARAFATE) 1  GM/10ML suspension Take 10 mLs (1 g total) by mouth 4 (four) times daily -  with meals and at bedtime. (Patient taking differently: Take 1 g by mouth 4 (four) times daily -  with meals and at bedtime. Pt takes as needed.)   [DISCONTINUED] fluconazole (DIFLUCAN) 200 MG tablet Take 1 tablet (200 mg total) by mouth daily. X 7 days in addition to topical cream for candidal skin infeciton   [DISCONTINUED] ondansetron (ZOFRAN ODT) 8 MG disintegrating tablet Take 1 tablet (8 mg total) by mouth 2 (two) times daily as needed for nausea or vomiting.   [DISCONTINUED]  promethazine (PHENERGAN) 25 MG suppository Place 1 suppository (25 mg total) rectally every 6 (six) hours as needed for nausea or vomiting.   fluconazole (DIFLUCAN) 200 MG tablet Take 1 tablet (200 mg total) by mouth daily. X 7 days in addition to topical cream for candidal skin infeciton   Facility-Administered Encounter Medications as of 11/06/2022  Medication   sodium chloride flush (NS) 0.9 % injection 10 mL     Patient Active Problem List   Diagnosis Date Noted   Restrictive lung disease 01/20/2022   Medicare annual wellness visit, subsequent 02/09/2021   DNR (do not resuscitate) 05/31/2020   Chronic respiratory failure with hypoxia, on home O2 therapy ([place]) 05/04/2020   Obesity hypoventilation syndrome ([place]) 05/04/2020   LVH (left ventricular hypertrophy) 05/04/2020   Pneumonia due to COVID-19 virus 05/03/2020   Non-ischemic cardiomyopathy ([place]) 12/29/2019   HFrEF (heart failure with reduced ejection fraction) ([place]) 11/25/2019   UTI (urinary tract infection) 11/25/2019   NSTEMI (non-ST elevated myocardial infarction) ([place]) 11/09/2019   QT prolongation 11/09/2019   Nausea and vomiting 11/03/2019   Diminished pulses in lower extremity 08/11/2019   Osteopenia 08/11/2019   Wound infection complicating hardware ([place]) 04/01/2019   Lumbar adjacent segment disease with spondylolisthesis 03/23/2019   Neurogenic urinary incontinence 11/29/2018   Urinary incontinence 11/29/2018   Bladder spasms 11/29/2018   Discogenic low back pain 11/29/2018   Chronic sacroiliac joint pain (Bilateral) (R>L) 11/29/2018   Spondylosis without myelopathy or radiculopathy, lumbar region 11/09/2018   Chronic low back pain (Bilateral) (L>R) w/o sciatica 11/09/2018   History of hip replacement (Left) 10/13/2018   History of back surgery 10/13/2018   Vitamin D insufficiency 10/13/2018   Osteoarthritis of facet joint of lumbar spine 10/13/2018   Osteoarthritis involving multiple joints 10/13/2018   Cervical  radiculopathy (Left) 10/13/2018   Lumbar facet arthropathy (Bilateral) 10/13/2018   Lumbar facet syndrome (Bilateral) (L>R) 10/13/2018   Abnormal MRI, lumbar spine (09/16/2018) 10/13/2018   Lumbar nerve root compression (L4) (Left) 10/13/2018   Lumbar foraminal stenosis 10/13/2018   Chronic hip pain after total replacement x 3 (Left) 10/13/2018   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   Chronic low back pain (Primary Area of Pain) (Bilateral) (L>R) w/ sciatica (Left) 09/14/2018   Chronic lower extremity pain (Secondary Area of Pain) (Left) 09/14/2018   Chronic hip pain (Tertiary Area of Pain) (Bilateral) (L>R) 09/14/2018   Chronic knee pain (Fourth Area of Pain) (Bilateral) (R>L) 09/14/2018   Polypharmacy 09/14/2018   Disorder of skeletal system 09/14/2018   Problems influencing health status 09/14/2018   Primary osteoarthritis of first carpometacarpal joint of left hand 04/20/2018   PAH (pulmonary artery hypertension) ([place]) 08/03/2017   Advanced care planning/counseling discussion 06/25/2017   Synovial cyst of lumbar facet joint 11/13/2016   CAD (coronary artery disease) 07/30/2016   Centrilobular emphysema ([place]) 07/30/2016   Ex-smoker 07/08/2016   Nonspecific abnormal  electrocardiogram (ECG) (EKG) 07/08/2016   Chronic lumbar radicular pain (L4) (Bilateral) 06/09/2016   DOE (dyspnea on exertion) 05/22/2016   Degenerative joint disease (DJD) of hip 04/17/2016   Somatic dysfunction of sacroiliac joint 04/17/2016   S/P Lumbar Fusion (L3-4 PLIF) 04/17/2016   Cervical fusion syndrome 04/17/2016   Pedal edema 02/19/2016   Chronic pain syndrome 02/19/2016   Aortic atherosclerosis ([place]) 12/11/2015   MRSA (methicillin resistant Staphylococcus aureus) infection 10/11/2012   Infection or inflammatory reaction due to internal joint prosthesis ([place]) 12/03/2011   Morbid obesity with BMI of 60.0-69.9, adult ([place]) 05/13/2011   Essential hypertension 05/17/2010   Cervical radiculopathy (Right)  12/25/2009   Benign positional vertigo 07/21/2008   Hyperlipidemia associated with type 2 diabetes mellitus ([place]) 08/16/2007   Hypothyroidism 07/30/2007   Type 2 diabetes mellitus with peripheral neuropathy ([place]) 07/30/2007   Normocytic anemia 07/30/2007   Bipolar disorder ([place]) 07/30/2007   Allergic rhinitis 07/30/2007   Asthma 07/30/2007   GERD 07/30/2007   Osteoarthritis 07/30/2007   OSA treated with BiPAP 07/30/2007     Health Maintenance Due  Topic Date Due   COVID-19 Vaccine (1) Never done   Zoster Vaccines- Shingrix (1 of 2) Never done   DTaP/Tdap/Td (2 - Tdap) 06/29/2017   MAMMOGRAM  08/09/2020   FOOT EXAM  08/10/2021     Review of Systems No fever or chills or nightsweats Physical Exam   BP (!) 147/83   Pulse 74   Temp (!) 97.5 F (36.4 C) (Oral)   LMP  (LMP Unknown)   SpO2 94%    On skin exam - "Lumbar region "kissing lesion" erythema, slightly raised border. No overt purulent drainage. Similar appearance, less erythema to crease of buttock  CBC Lab Results  Component Value Date   WBC 6.8 11/06/2022   RBC 4.33 11/06/2022   HGB 12.8 11/06/2022   HCT 38.4 11/06/2022   PLT 267 11/06/2022   MCV 88.7 11/06/2022   MCH 29.6 11/06/2022   MCHC 33.3 11/06/2022   RDW 13.9 11/06/2022   LYMPHSABS 1,088 11/06/2022   MONOABS 0.5 09/19/2022   EOSABS 177 11/06/2022    BMET Lab Results  Component Value Date   NA 141 11/06/2022   K 4.4 11/06/2022   CL 101 11/06/2022   CO2 28 11/06/2022   GLUCOSE 127 (H) 11/06/2022   BUN 17 11/06/2022   CREATININE 0.70 11/06/2022   CALCIUM 9.9 11/06/2022   GFRNONAA >60 05/22/2020   GFRAA >60 05/22/2020   Lab Results  Component Value Date   ESRSEDRATE 28 11/06/2022   Lab Results  Component Value Date   WBC 6.8 11/06/2022   HGB 12.8 11/06/2022   HCT 38.4 11/06/2022   MCV 88.7 11/06/2022   PLT 267 11/06/2022   Remote micro:   Methicillin resistant staphylococcus aureus    MIC    CIPROFLOXACIN >=8 RESISTANT  Resistant    CLINDAMYCIN >=8 RESISTANT Resistant    ERYTHROMYCIN >=8 RESISTANT Resistant    GENTAMICIN <=0.5 SENSI... Sensitive    Inducible Clindamycin NEGATIVE Sensitive    OXACILLIN >=4 RESISTANT Resistant    RIFAMPIN <=0.5 SENSI... Sensitive    TETRACYCLINE 2 SENSITIVE Sensitive    TRIMETH/SULFA <=10 SENSIT... Sensitive    VANCOMYCIN <=0.5 SENSI... Sensitive      Assessment and Plan  Appearance does look similar to candidal skin infection - she states that chronic abtx has predisposed her to candidal skin infection   - will do 7 day trial of antifungal, fluc 215m po daily  plus topical cream to see any improvement - touch base in 1 week if not improved, then plan to do imaging to see if signs of deep tissue infection - will check labs, inflammatory markers to compare from previous times - for the time being continue on doxycycline 113m po bid

## 2022-11-09 ENCOUNTER — Other Ambulatory Visit: Payer: Self-pay | Admitting: Family Medicine

## 2022-11-13 ENCOUNTER — Telehealth: Payer: Medicare Other | Admitting: Internal Medicine

## 2022-11-13 ENCOUNTER — Other Ambulatory Visit: Payer: Self-pay

## 2022-11-30 ENCOUNTER — Other Ambulatory Visit: Payer: Self-pay | Admitting: Family Medicine

## 2022-11-30 DIAGNOSIS — E039 Hypothyroidism, unspecified: Secondary | ICD-10-CM

## 2022-12-02 DIAGNOSIS — F3181 Bipolar II disorder: Secondary | ICD-10-CM | POA: Diagnosis not present

## 2022-12-19 ENCOUNTER — Telehealth: Payer: Self-pay | Admitting: Family Medicine

## 2022-12-19 DIAGNOSIS — Z7189 Other specified counseling: Secondary | ICD-10-CM

## 2022-12-19 NOTE — Telephone Encounter (Signed)
Placed ppw in Dr. G's box.  ?

## 2022-12-19 NOTE — Telephone Encounter (Signed)
Forms reviewed, will send for scanning.  Advanced directive - brings forms which were scanned 11/2022. Amy Barnes and [place] are [place]. Does not want life prolonging measures if terminal condition. Wants living will followed over [place].

## 2022-12-19 NOTE — Telephone Encounter (Signed)
Patient's husband Luise Yowell dropped off advance directive ppw completed and notarized. Forms will be placed in providers box.

## 2022-12-22 ENCOUNTER — Encounter: Payer: Self-pay | Admitting: Internal Medicine

## 2022-12-22 ENCOUNTER — Encounter: Payer: Self-pay | Admitting: Family Medicine

## 2022-12-22 ENCOUNTER — Encounter: Payer: Self-pay | Admitting: Cardiology

## 2022-12-22 NOTE — Telephone Encounter (Signed)
FYI

## 2022-12-23 ENCOUNTER — Other Ambulatory Visit: Payer: Self-pay

## 2022-12-23 MED ORDER — DOXYCYCLINE HYCLATE 100 MG PO TABS: 100.0000 mg | ORAL_TABLET | Freq: Two times a day (BID) | ORAL | 0 refills | Status: DC

## 2022-12-23 MED ORDER — CARVEDILOL 6.25 MG PO TABS: 6.2500 mg | ORAL_TABLET | Freq: Two times a day (BID) | ORAL | 0 refills | Status: DC

## 2022-12-24 ENCOUNTER — Other Ambulatory Visit: Payer: Self-pay

## 2022-12-24 DIAGNOSIS — E1142 Type 2 diabetes mellitus with diabetic polyneuropathy: Secondary | ICD-10-CM

## 2022-12-24 DIAGNOSIS — K219 Gastro-esophageal reflux disease without esophagitis: Secondary | ICD-10-CM

## 2022-12-24 DIAGNOSIS — E039 Hypothyroidism, unspecified: Secondary | ICD-10-CM

## 2022-12-24 DIAGNOSIS — J4520 Mild intermittent asthma, uncomplicated: Secondary | ICD-10-CM

## 2022-12-24 MED ORDER — MONTELUKAST SODIUM 10 MG PO TABS: 10.0000 mg | ORAL_TABLET | Freq: Every day | ORAL | 1 refills | Status: DC

## 2022-12-24 MED ORDER — GLIMEPIRIDE 1 MG PO TABS: 1.0000 mg | ORAL_TABLET | Freq: Every day | ORAL | 1 refills | Status: DC

## 2022-12-24 MED ORDER — LEVOTHYROXINE SODIUM 150 MCG PO TABS: 150.0000 ug | ORAL_TABLET | Freq: Every day | ORAL | 1 refills | Status: DC

## 2022-12-24 MED ORDER — PANTOPRAZOLE SODIUM 40 MG PO TBEC: 40.0000 mg | DELAYED_RELEASE_TABLET | Freq: Two times a day (BID) | ORAL | 1 refills | Status: DC

## 2022-12-24 NOTE — Telephone Encounter (Signed)
E-scribed refills.  

## 2022-12-26 ENCOUNTER — Other Ambulatory Visit: Payer: Self-pay

## 2022-12-26 MED ORDER — POTASSIUM CHLORIDE ER 10 MEQ PO TBCR: 10.0000 meq | EXTENDED_RELEASE_TABLET | Freq: Every day | ORAL | 6 refills | Status: DC

## 2022-12-30 ENCOUNTER — Telehealth: Payer: Self-pay | Admitting: Cardiology

## 2022-12-30 NOTE — Telephone Encounter (Signed)
*  STAT* If patient is at the pharmacy, call can be transferred to refill team.   1. Which medications need to be refilled? (please list name of each medication and dose if known) furosemide (LASIX) 20 MG tablet   2. Which pharmacy/location (including street and city if local pharmacy) is medication to be sent to?  [place], [place]    3. Do they need a 30 day or 90 day supply? 90 day

## 2023-01-01 ENCOUNTER — Other Ambulatory Visit: Payer: Self-pay

## 2023-01-01 MED ORDER — FUROSEMIDE 20 MG PO TABS: 40.0000 mg | ORAL_TABLET | Freq: Two times a day (BID) | ORAL | 1 refills | Status: DC | PRN

## 2023-01-12 ENCOUNTER — Other Ambulatory Visit: Payer: Self-pay | Admitting: Internal Medicine

## 2023-01-12 ENCOUNTER — Other Ambulatory Visit: Payer: Self-pay | Admitting: Family Medicine

## 2023-01-12 DIAGNOSIS — A4902 Methicillin resistant Staphylococcus aureus infection, unspecified site: Secondary | ICD-10-CM

## 2023-01-12 NOTE — Telephone Encounter (Signed)
Fill request Simvastatin Last refill 02/19/22 #90/3 Last office visit 09/26/22 Upcoming appointment 04/10/23 See allergy/contraindication

## 2023-01-13 ENCOUNTER — Other Ambulatory Visit: Payer: Self-pay | Admitting: Internal Medicine

## 2023-01-13 ENCOUNTER — Other Ambulatory Visit: Payer: Self-pay | Admitting: Cardiology

## 2023-01-14 ENCOUNTER — Other Ambulatory Visit: Payer: Self-pay

## 2023-01-14 DIAGNOSIS — E1142 Type 2 diabetes mellitus with diabetic polyneuropathy: Secondary | ICD-10-CM

## 2023-01-14 NOTE — Telephone Encounter (Signed)
Received faxed refill request from [facility] mail order pharmacy.  Gabapentin last rx:  01/05/22, #60 Ozempic last rx:  03/21/22, #9 mL Last OV: 09/26/22, 6 mo DM f/u Next OV:  04/10/23, CPE

## 2023-01-14 NOTE — Telephone Encounter (Signed)
Ok to refill?  Scheduled to come in on 01/29/23.

## 2023-01-16 MED ORDER — GABAPENTIN 100 MG PO CAPS: ORAL_CAPSULE | ORAL | 6 refills | Status: DC

## 2023-01-16 MED ORDER — OZEMPIC (0.25 OR 0.5 MG/DOSE) 2 MG/3ML ~~LOC~~ SOPN: 0.5000 mg | PEN_INJECTOR | SUBCUTANEOUS | 3 refills | Status: DC

## 2023-01-16 NOTE — Telephone Encounter (Signed)
ERx 

## 2023-01-27 ENCOUNTER — Telehealth: Payer: Medicare Other | Admitting: Internal Medicine

## 2023-01-29 ENCOUNTER — Other Ambulatory Visit: Payer: Self-pay

## 2023-01-29 ENCOUNTER — Telehealth (INDEPENDENT_AMBULATORY_CARE_PROVIDER_SITE_OTHER): Payer: Medicare Other | Admitting: Internal Medicine

## 2023-01-29 ENCOUNTER — Encounter: Payer: Self-pay | Admitting: Internal Medicine

## 2023-01-29 DIAGNOSIS — B3731 Acute candidiasis of vulva and vagina: Secondary | ICD-10-CM | POA: Diagnosis not present

## 2023-01-29 DIAGNOSIS — T847XXD Infection and inflammatory reaction due to other internal orthopedic prosthetic devices, implants and grafts, subsequent encounter: Secondary | ICD-10-CM

## 2023-01-29 DIAGNOSIS — M549 Dorsalgia, unspecified: Secondary | ICD-10-CM | POA: Diagnosis not present

## 2023-01-29 NOTE — Progress Notes (Signed)
Virtual Visit via Video Note  I connected with Amy Barnes on 01/29/23 at  4:00 PM EDT by a video enabled telemedicine application and verified that I am speaking with the correct person using two identifiers.  Location: Patient: home Provider: clinic   I discussed the limitations of evaluation and management by telemedicine and the availability of in person appointments. The patient expressed understanding and agreed to proceed.  History of Present Illness:  Still on chronic doxycycline Her back pain Has not been too debilitating   Observations/Objective: Gen = a xo by 3 in nad  Assessment and Plan: Doxy, bid and fluconazole prn Sed rate and crp in July with dr g See back in August virtual Follow Up Instructions:    I discussed the assessment and treatment plan with the patient. The patient was provided an opportunity to ask questions and all were answered. The patient agreed with the plan and demonstrated an understanding of the instructions.   The patient was advised to call back or seek an in-person evaluation if the symptoms worsen or if the condition fails to improve as anticipated.  I provided 10 minutes of non-face-to-face time during this encounter.   Judyann Munson, MD

## 2023-02-06 ENCOUNTER — Other Ambulatory Visit: Payer: Self-pay | Admitting: Cardiology

## 2023-03-04 ENCOUNTER — Other Ambulatory Visit: Payer: Self-pay | Admitting: Cardiology

## 2023-03-05 DIAGNOSIS — F3181 Bipolar II disorder: Secondary | ICD-10-CM | POA: Diagnosis not present

## 2023-04-02 ENCOUNTER — Other Ambulatory Visit: Payer: Self-pay | Admitting: Family Medicine

## 2023-04-02 DIAGNOSIS — D649 Anemia, unspecified: Secondary | ICD-10-CM

## 2023-04-02 DIAGNOSIS — E559 Vitamin D deficiency, unspecified: Secondary | ICD-10-CM

## 2023-04-02 DIAGNOSIS — E1169 Type 2 diabetes mellitus with other specified complication: Secondary | ICD-10-CM

## 2023-04-02 DIAGNOSIS — E039 Hypothyroidism, unspecified: Secondary | ICD-10-CM

## 2023-04-02 DIAGNOSIS — T847XXS Infection and inflammatory reaction due to other internal orthopedic prosthetic devices, implants and grafts, sequela: Secondary | ICD-10-CM

## 2023-04-02 DIAGNOSIS — E1142 Type 2 diabetes mellitus with diabetic polyneuropathy: Secondary | ICD-10-CM

## 2023-04-06 ENCOUNTER — Other Ambulatory Visit (INDEPENDENT_AMBULATORY_CARE_PROVIDER_SITE_OTHER): Payer: Medicare Other

## 2023-04-06 DIAGNOSIS — T847XXS Infection and inflammatory reaction due to other internal orthopedic prosthetic devices, implants and grafts, sequela: Secondary | ICD-10-CM

## 2023-04-06 DIAGNOSIS — D649 Anemia, unspecified: Secondary | ICD-10-CM

## 2023-04-06 DIAGNOSIS — E1169 Type 2 diabetes mellitus with other specified complication: Secondary | ICD-10-CM

## 2023-04-06 DIAGNOSIS — E559 Vitamin D deficiency, unspecified: Secondary | ICD-10-CM | POA: Diagnosis not present

## 2023-04-06 DIAGNOSIS — E1142 Type 2 diabetes mellitus with diabetic polyneuropathy: Secondary | ICD-10-CM

## 2023-04-06 DIAGNOSIS — E785 Hyperlipidemia, unspecified: Secondary | ICD-10-CM

## 2023-04-06 DIAGNOSIS — E039 Hypothyroidism, unspecified: Secondary | ICD-10-CM | POA: Diagnosis not present

## 2023-04-06 LAB — COMPREHENSIVE METABOLIC PANEL
ALT: 16 U/L (ref 0–35)
AST: 14 U/L (ref 0–37)
Albumin: 3.9 g/dL (ref 3.5–5.2)
Alkaline Phosphatase: 61 U/L (ref 39–117)
BUN: 17 mg/dL (ref 6–23)
CO2: 33 mEq/L — ABNORMAL HIGH (ref 19–32)
Calcium: 9.5 mg/dL (ref 8.4–10.5)
Chloride: 101 mEq/L (ref 96–112)
Creatinine, Ser: 0.75 mg/dL (ref 0.40–1.20)
GFR: 80.43 mL/min (ref >60.00)
Glucose, Bld: 163 mg/dL — ABNORMAL HIGH (ref 70–99)
Potassium: 4.3 mEq/L (ref 3.5–5.1)
Sodium: 141 mEq/L (ref 135–145)
Total Bilirubin: 0.5 mg/dL (ref 0.2–1.2)
Total Protein: 6.3 g/dL (ref 6.0–8.3)

## 2023-04-06 LAB — LIPID PANEL
Cholesterol: 125 mg/dL (ref 0–200)
HDL: 48.6 mg/dL (ref >39.00)
LDL Cholesterol: 59 mg/dL (ref 0–99)
NonHDL: 75.99
Total CHOL/HDL Ratio: 3
Triglycerides: 86 mg/dL (ref 0.0–149.0)
VLDL: 17.2 mg/dL (ref 0.0–40.0)

## 2023-04-06 LAB — CBC WITH DIFFERENTIAL/PLATELET
Basophils Absolute: 0 10*3/uL (ref 0.0–0.1)
Basophils Relative: 0.5 % (ref 0.0–3.0)
Eosinophils Absolute: 0.3 10*3/uL (ref 0.0–0.7)
Eosinophils Relative: 5.4 % — ABNORMAL HIGH (ref 0.0–5.0)
HCT: 36.5 % (ref 36.0–46.0)
Hemoglobin: 11.8 g/dL — ABNORMAL LOW (ref 12.0–15.0)
Lymphocytes Relative: 23.5 % (ref 12.0–46.0)
Lymphs Abs: 1.5 10*3/uL (ref 0.7–4.0)
MCHC: 32.3 g/dL (ref 30.0–36.0)
MCV: 88.2 fl (ref 78.0–100.0)
Monocytes Absolute: 0.6 10*3/uL (ref 0.1–1.0)
Monocytes Relative: 8.8 % (ref 3.0–12.0)
Neutro Abs: 3.9 10*3/uL (ref 1.4–7.7)
Neutrophils Relative %: 61.8 % (ref 43.0–77.0)
Platelets: 224 10*3/uL (ref 150.0–400.0)
RBC: 4.13 Mil/uL (ref 3.87–5.11)
RDW: 16.2 % — ABNORMAL HIGH (ref 11.5–15.5)
WBC: 6.3 10*3/uL (ref 4.0–10.5)

## 2023-04-06 LAB — TSH: TSH: 2.09 u[IU]/mL (ref 0.35–5.50)

## 2023-04-06 LAB — SEDIMENTATION RATE: Sed Rate: 39 mm/hr — ABNORMAL HIGH (ref 0–30)

## 2023-04-06 LAB — C-REACTIVE PROTEIN: CRP: 1.6 mg/dL (ref 0.5–20.0)

## 2023-04-06 LAB — HEMOGLOBIN A1C: Hgb A1c MFr Bld: 6.6 % — ABNORMAL HIGH (ref 4.6–6.5)

## 2023-04-06 LAB — VITAMIN D 25 HYDROXY (VIT D DEFICIENCY, FRACTURES): VITD: 14.89 ng/mL — ABNORMAL LOW (ref 30.00–100.00)

## 2023-04-06 NOTE — Addendum Note (Signed)
Addended by: Lorelle Formosa E on: 04/06/2023 08:30 AM   Modules accepted: Orders

## 2023-04-07 LAB — MICROALBUMIN / CREATININE URINE RATIO
Creatinine,U: 25.2 mg/dL
Microalb Creat Ratio: 2.8 mg/g (ref 0.0–30.0)
Microalb, Ur: <0.7 mg/dL (ref 0.0–1.9)

## 2023-04-10 ENCOUNTER — Encounter: Payer: Self-pay | Admitting: Family Medicine

## 2023-04-10 ENCOUNTER — Ambulatory Visit (INDEPENDENT_AMBULATORY_CARE_PROVIDER_SITE_OTHER): Payer: Medicare Other | Admitting: Family Medicine

## 2023-04-10 VITALS — BP 126/84 | HR 70 | Temp 97.8°F | Ht 63.0 in | Wt 355.1 lb

## 2023-04-10 DIAGNOSIS — Z6841 Body Mass Index (BMI) 40.0 and over, adult: Secondary | ICD-10-CM

## 2023-04-10 DIAGNOSIS — J9611 Chronic respiratory failure with hypoxia: Secondary | ICD-10-CM | POA: Diagnosis not present

## 2023-04-10 DIAGNOSIS — G4733 Obstructive sleep apnea (adult) (pediatric): Secondary | ICD-10-CM

## 2023-04-10 DIAGNOSIS — E1169 Type 2 diabetes mellitus with other specified complication: Secondary | ICD-10-CM

## 2023-04-10 DIAGNOSIS — E039 Hypothyroidism, unspecified: Secondary | ICD-10-CM | POA: Diagnosis not present

## 2023-04-10 DIAGNOSIS — E1142 Type 2 diabetes mellitus with diabetic polyneuropathy: Secondary | ICD-10-CM

## 2023-04-10 DIAGNOSIS — R300 Dysuria: Secondary | ICD-10-CM

## 2023-04-10 DIAGNOSIS — I5020 Unspecified systolic (congestive) heart failure: Secondary | ICD-10-CM | POA: Diagnosis not present

## 2023-04-10 DIAGNOSIS — Z9981 Dependence on supplemental oxygen: Secondary | ICD-10-CM

## 2023-04-10 DIAGNOSIS — E559 Vitamin D deficiency, unspecified: Secondary | ICD-10-CM

## 2023-04-10 DIAGNOSIS — Z0000 Encounter for general adult medical examination without abnormal findings: Secondary | ICD-10-CM

## 2023-04-10 DIAGNOSIS — N3289 Other specified disorders of bladder: Secondary | ICD-10-CM

## 2023-04-10 DIAGNOSIS — Z7189 Other specified counseling: Secondary | ICD-10-CM | POA: Diagnosis not present

## 2023-04-10 DIAGNOSIS — M961 Postlaminectomy syndrome, not elsewhere classified: Secondary | ICD-10-CM

## 2023-04-10 DIAGNOSIS — I2510 Atherosclerotic heart disease of native coronary artery without angina pectoris: Secondary | ICD-10-CM

## 2023-04-10 DIAGNOSIS — F3170 Bipolar disorder, currently in remission, most recent episode unspecified: Secondary | ICD-10-CM | POA: Diagnosis not present

## 2023-04-10 DIAGNOSIS — J4520 Mild intermittent asthma, uncomplicated: Secondary | ICD-10-CM | POA: Diagnosis not present

## 2023-04-10 DIAGNOSIS — Z79899 Other long term (current) drug therapy: Secondary | ICD-10-CM

## 2023-04-10 DIAGNOSIS — M85851 Other specified disorders of bone density and structure, right thigh: Secondary | ICD-10-CM

## 2023-04-10 DIAGNOSIS — K219 Gastro-esophageal reflux disease without esophagitis: Secondary | ICD-10-CM | POA: Diagnosis not present

## 2023-04-10 DIAGNOSIS — D649 Anemia, unspecified: Secondary | ICD-10-CM

## 2023-04-10 DIAGNOSIS — I700 Atherosclerosis of aorta: Secondary | ICD-10-CM

## 2023-04-10 DIAGNOSIS — I10 Essential (primary) hypertension: Secondary | ICD-10-CM

## 2023-04-10 DIAGNOSIS — T847XXS Infection and inflammatory reaction due to other internal orthopedic prosthetic devices, implants and grafts, sequela: Secondary | ICD-10-CM

## 2023-04-10 DIAGNOSIS — E662 Morbid (severe) obesity with alveolar hypoventilation: Secondary | ICD-10-CM | POA: Diagnosis not present

## 2023-04-10 DIAGNOSIS — E785 Hyperlipidemia, unspecified: Secondary | ICD-10-CM

## 2023-04-10 LAB — POC URINALSYSI DIPSTICK (AUTOMATED)
Bilirubin, UA: NEGATIVE
Blood, UA: NEGATIVE
Glucose, UA: NEGATIVE
Ketones, UA: NEGATIVE
Leukocytes, UA: NEGATIVE
Nitrite, UA: NEGATIVE
Protein, UA: NEGATIVE
Spec Grav, UA: 1.015 (ref 1.010–1.025)
Urobilinogen, UA: 0.2 E.U./dL
pH, UA: 6.5 (ref 5.0–8.0)

## 2023-04-10 MED ORDER — MONTELUKAST SODIUM 10 MG PO TABS
10.0000 mg | ORAL_TABLET | Freq: Every day | ORAL | 4 refills | Status: DC
Start: 2023-04-10 — End: 2024-04-15

## 2023-04-10 MED ORDER — LEVOTHYROXINE SODIUM 150 MCG PO TABS: 150.0000 ug | ORAL_TABLET | Freq: Every day | ORAL | 4 refills | Status: DC

## 2023-04-10 MED ORDER — PANTOPRAZOLE SODIUM 40 MG PO TBEC
40.0000 mg | DELAYED_RELEASE_TABLET | Freq: Two times a day (BID) | ORAL | 4 refills | Status: DC
Start: 2023-04-10 — End: 2024-04-15

## 2023-04-10 MED ORDER — GLIMEPIRIDE 1 MG PO TABS: 1.0000 mg | ORAL_TABLET | Freq: Every day | ORAL | 4 refills | Status: DC

## 2023-04-10 MED ORDER — GABAPENTIN 100 MG PO CAPS: 100.0000 mg | ORAL_CAPSULE | Freq: Every day | ORAL | 4 refills | Status: DC

## 2023-04-10 MED ORDER — SIMVASTATIN 40 MG PO TABS
40.0000 mg | ORAL_TABLET | Freq: Every evening | ORAL | 4 refills | Status: DC
Start: 2023-04-10 — End: 2024-04-15

## 2023-04-10 NOTE — Patient Instructions (Addendum)
Urinalysis today  If interested, check with pharmacy about new 2 shot shingles series (shingrix) and RSV vaccine.  Aim for vitamin D 3000-4000 unit daily. Good to see you today  Return as needed or in 1 year for next wellness visit.   Call medicare to ask for options to replace Symbicort - want a combination long acting bronchodilator (LABA) and inhaled corticosteroid (ISC).

## 2023-04-10 NOTE — Assessment & Plan Note (Signed)

## 2023-04-10 NOTE — Assessment & Plan Note (Signed)
Previously discussed.

## 2023-04-10 NOTE — Progress Notes (Signed)
Ph: [phone] Fax: [phone]   Patient ID: [name], female    DOB: Feb 19, 1951, 72 y.o.   MRN: [id number]  This visit was conducted in person.  BP 126/84   Pulse 70   Temp 97.8 F (36.6 C) (Temporal)   Ht 5\' 3"  (1.6 m)   Wt (!) 355 lb 2 oz (161.1 kg)   LMP  (LMP Unknown)   SpO2 97% Comment: 4 L today; most days 3 L  BMI 62.91 kg/m    CC: AMW Subjective:   HPI: NYESHIA TAVANI is a 72 y.o. female presenting on 04/10/2023 for Medicare Wellness (Requests new rx for O2 sent to [facility]. Pt accompanied by daughter, [name]. )   Did not see health advisor .  Hearing Screening   500Hz  1000Hz  2000Hz  4000Hz   Right ear 40 40 25 40  Left ear 25 25 25  0  Vision Screening - Comments:: Last eye exam within past yr.   Flowsheet Row Office Visit from 04/10/2023 in [facility] [place]  PHQ-2 Total Score 2          04/10/2023    4:43 PM 01/29/2023    3:44 PM 07/24/2022    4:28 PM 03/21/2022    3:31 PM 12/03/2021    4:18 PM  Fall Risk   Falls in the past year? 0 0 0 0 0  Number falls in past yr:  0 0    Injury with Fall?  0 0    Risk for fall due to :   No Fall Risks    Follow up   Falls evaluation completed     H/o MRSA hardware infection followed by ID Drue Second) on doxycycline 100mg  daily for chronic infection suppression.   HFrEF sees cardiology (Hochrein) on lasix 40mg  BID PRN with potassium.   DM - continues ozempic 0.5mg  weekly and amaryl 1mg  daily.  Lab Results  Component Value Date   HGBA1C 6.6 (H) 04/06/2023    Bipolar II followed by Deatra Robinson psychiatry provider on lamictal 400mg  daily.   Asthma-COPD overlap - saw pulm Dr Thora Lance 04/2022 who prescribed symbicort - this was not covered by insurance. Took albuterol inhaler yesterday. Normally 1-2/wk.   Requests new Rx for oxygen [facility]. 3-4 L supplemental O2 by [place], oxygen concentrator and tanks.   She notes new dysuria for several weeks. Chronic urinary incontinence symptoms. No noted  blood in urine, nausea/vomiting, or fevers/chills.   Preventative: Pt desires to discontinue preventative healthcare screenings due to comorbidities.  COLONOSCOPY WITH PROPOFOL 12/31/2015; diverticulosis, int hem, o/w normal rpt 10 yrs ([place])  Breast cancer screening -07/2019 BiRads1 - due, declines repeat at this time.  Well woman exam - h/o partial hysterectomy for menorrhagia, ovaries remain. No pelvic pain/pressure or bleeding.  DEXA 07/2019 - T -1.1 R femur (osteopenia). Discussed calcium in diet. Discussed vit D.  Lung cancer screening - ex smoker, quit 2010. ~40 PY hx. previously underwent LRCT scans - last 08/2020. most recently HRCT of lungs done 02/2022 Flu shot - yearly  COVID vaccine - declined Td - 2008  Pneumovax 2013, prevnar-13 2019 (large local reaction after this). Pneumovax 12/2019. Zostavax - 2015  Shingrix - discussed Advanced directive - scanned 11/2022. Orvil Feil [place] and [place] are [place]. Does not want life prolonging measures if terminal condition. Wants living will followed over HCPOA. Previously scanned 05/2017. Husband Melvyn Neth is HCPOA. Does not want CPR, intubation. Confirmed DNR - goldenrod form provided. Does not want  prolonged life support if terminal condition. New packet provided as pt desires to update for herself and husband.  Seat belt use discussed Sunscreen use discussed. No changing moles on skin.  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 Interim medical history since our last visit reviewed. Allergies and  medications reviewed and updated. Outpatient Medications Prior to Visit  Medication Sig Dispense Refill   albuterol (PROVENTIL HFA;VENTOLIN HFA) 108 (90 Base) MCG/ACT inhaler Inhale 2 puffs into the lungs every 6 (six) hours as needed for wheezing or shortness of breath. 1 Inhaler 6   albuterol (PROVENTIL) (2.5 MG/3ML) 0.083% nebulizer solution Take 3 mLs (2.5 mg total) by nebulization every 6 (six) hours as needed for wheezing or shortness of breath. 75 mL 12   aspirin 81 MG EC tablet Take 81 mg by mouth at bedtime.     carvedilol (COREG) 6.25 MG tablet TAKE 1 TABLET TWICE DAILY (NEED MD APPOINTMENT) 60 tablet 0   Cholecalciferol (VITAMIN D) 50 MCG (2000 UT) CAPS Take 1 capsule (2,000 Units total) by mouth daily. 30 capsule    cycloSPORINE (RESTASIS) 0.05 % ophthalmic emulsion Place 1 drop into both eyes 2 (two) times daily.     dicyclomine (BENTYL) 20 MG tablet Take 1 tablet (20 mg total) by mouth in the morning and at bedtime. (Patient taking differently: Take 20 mg by mouth in the morning and at bedtime. As needed) 60 tablet 2   doxycycline (VIBRA-TABS) 100 MG tablet TAKE 1 TABLET TWICE DAILY 180 tablet 3   ferrous sulfate 325 (65 FE) MG EC tablet Take 1 tablet (325 mg total) by mouth every Monday, Wednesday, and Friday.     fluconazole (DIFLUCAN) 200 MG tablet Take 1 tablet (200 mg total) by mouth daily. X 7 days in addition to topical cream for candidal skin infeciton 14 tablet 1   furosemide (LASIX) 20 MG tablet TAKE 2 TABLETS TWICE DAILY AS NEEDED AS DIRECTED 60 tablet 0   lamoTRIgine (LAMICTAL) 200 MG tablet Take 400 mg by mouth daily.     Multiple Vitamin (MULTIVITAMIN WITH MINERALS) TABS tablet Take 2 tablets by mouth daily.      nystatin cream (MYCOSTATIN) Apply 1 Application topically 2 (two) times daily. 30 g 0   OZEMPIC, 0.25 OR 0.5 MG/DOSE, 2 MG/3ML SOPN Inject 0.5 mg into the skin every 7 (seven) days. 9 mL 3   potassium chloride (KLOR-CON) 10 MEQ tablet Take 1 tablet (10 mEq  total) by mouth daily. With lasix. May take an additional 10 meq when taking 60 meq of lasix 40 tablet 6   prochlorperazine (COMPAZINE) 10 MG tablet Take 1 tablet (10 mg total) by mouth every 6 (six) hours as needed for nausea or vomiting. 30 tablet 1   PROZAC 20 MG capsule Take 60 mg by mouth daily.     albuterol (PROVENTIL) (5 MG/ML) 0.5% nebulizer solution Take 0.5 mLs (2.5 mg total) by nebulization every 4 (four) hours as needed for wheezing or shortness of breath. 20 mL 6   gabapentin (NEURONTIN) 100 MG  capsule TAKE 1-2 CAPSULES BY MOUTH AT BEDTIME 60 capsule 6   glimepiride (AMARYL) 1 MG tablet Take 1 tablet (1 mg total) by mouth daily with breakfast. 90 tablet 1   levothyroxine (SYNTHROID) 150 MCG tablet Take 1 tablet (150 mcg total) by mouth at bedtime. 90 tablet 1   montelukast (SINGULAIR) 10 MG tablet Take 1 tablet (10 mg total) by mouth at bedtime. 90 tablet 1   pantoprazole (PROTONIX) 40 MG tablet Take 1 tablet (40 mg total) by mouth 2 (two) times daily. 180 tablet 1   polyethylene glycol (MIRALAX / GLYCOLAX) 17 g packet Take 17 g by mouth 2 (two) times daily as needed for mild constipation. 14 each 0   simvastatin (ZOCOR) 40 MG tablet TAKE 1 TABLET EVERY EVENING 90 tablet 1   sucralfate (CARAFATE) 1 GM/10ML suspension Take 10 mLs (1 g total) by mouth 4 (four) times daily -  with meals and at bedtime. (Patient taking differently: Take 1 g by mouth 4 (four) times daily -  with meals and at bedtime. Pt takes as needed.) 420 mL 0   budesonide-formoterol (SYMBICORT) 160-4.5 MCG/ACT inhaler Inhale 2 puffs into the lungs 2 (two) times daily. 1 each 11   sodium chloride flush (NS) 0.9 % injection 10 mL      No facility-administered medications prior to visit.     Per HPI unless specifically indicated in ROS section below Review of Systems  Objective:  BP 126/84   Pulse 70   Temp 97.8 F (36.6 C) (Temporal)   Ht 5\' 3"  (1.6 m)   Wt (!) 355 lb 2 oz (161.1 kg)   LMP  (LMP Unknown)    SpO2 97% Comment: 4 L today; most days 3 L  BMI 62.91 kg/m   Wt Readings from Last 3 Encounters:  04/10/23 (!) 355 lb 2 oz (161.1 kg)  05/06/22 (!) 369 lb 12.8 oz (167.7 kg)  04/07/22 (!) 377 lb (171 kg)      Physical Exam Vitals and nursing note reviewed.  Constitutional:      Appearance: Normal appearance. She is not ill-appearing.  HENT:     Head: Normocephalic and atraumatic.     Right Ear: Tympanic membrane, ear canal and external ear normal. There is no impacted cerumen.     Left Ear: Tympanic membrane, ear canal and external ear normal. There is no impacted cerumen.     Mouth/Throat:     Mouth: Mucous membranes are moist.     Pharynx: Oropharynx is clear. No oropharyngeal exudate or posterior oropharyngeal erythema.  Eyes:     General:        Right eye: No discharge.        Left eye: No discharge.     Extraocular Movements: Extraocular movements intact.     Conjunctiva/sclera: Conjunctivae normal.     Pupils: Pupils are equal, round, and reactive to light.  Neck:     Thyroid: No thyroid mass or thyromegaly.  Cardiovascular:     Rate and Rhythm: Normal rate and regular rhythm.     Pulses: Normal pulses.     Heart sounds: Normal heart sounds. No murmur heard. Pulmonary:     Effort: Pulmonary effort is normal. No respiratory distress.     Breath sounds: Normal breath sounds. No wheezing, rhonchi or rales.  Abdominal:     General: Bowel sounds are normal. There is no distension.     Palpations: Abdomen is soft. There is no mass.  Tenderness: There is no abdominal tenderness. There is no guarding or rebound.     Hernia: No hernia is present.  Musculoskeletal:     Cervical back: Normal range of motion and neck supple. No rigidity.     Right lower leg: No edema.     Left lower leg: No edema.  Lymphadenopathy:     Cervical: No cervical adenopathy.  Skin:    General: Skin is warm and dry.     Findings: No rash.  Neurological:     General: No focal deficit present.      Mental Status: She is alert. Mental status is at baseline.     Comments:  Recall 2/3, 3/3 with cue Calculation 3/5 DLORW  Psychiatric:        Mood and Affect: Mood normal.        Behavior: Behavior normal.    Diabetic Foot Exam - Simple   Simple Foot Form Diabetic Foot exam was performed with the following findings: Yes 04/10/2023  4:51 PM  Visual Inspection See comments: Yes Sensation Testing Intact to touch and monofilament testing bilaterally: Yes Pulse Check See comments: Yes Comments 1+ DP bilaterally Dry skin to interdigital web spaces R>L toes Dry skin to R>L sole        Results for orders placed or performed in visit on 04/10/23  POCT Urinalysis Dipstick (Automated)  Result Value Ref Range   Color, UA yellow    Clarity, UA clear    Glucose, UA Negative Negative   Bilirubin, UA negative    Ketones, UA negative    Spec Grav, UA 1.015 1.010 - 1.025   Blood, UA negative    pH, UA 6.5 5.0 - 8.0   Protein, UA Negative Negative   Urobilinogen, UA 0.2 0.2 or 1.0 E.U./dL   Nitrite, UA negative    Leukocytes, UA Negative Negative   Lab Results  Component Value Date   CHOL 125 04/06/2023   HDL 48.60 04/06/2023   LDLCALC 59 04/06/2023   LDLDIRECT 48.0 03/13/2016   TRIG 86.0 04/06/2023   CHOLHDL 3 04/06/2023    Lab Results  Component Value Date   WBC 6.3 04/06/2023   HGB 11.8 (L) 04/06/2023   HCT 36.5 04/06/2023   MCV 88.2 04/06/2023   PLT 224.0 04/06/2023    Lab Results  Component Value Date   NA 141 04/06/2023   CL 101 04/06/2023   K 4.3 04/06/2023   CO2 33 (H) 04/06/2023   BUN 17 04/06/2023   CREATININE 0.75 04/06/2023   GFR 80.43 04/06/2023   CALCIUM 9.5 04/06/2023   PHOS 2.8 05/08/2020   ALBUMIN 3.9 04/06/2023   GLUCOSE 163 (H) 04/06/2023    Lab Results  Component Value Date   ALT 16 04/06/2023   AST 14 04/06/2023   ALKPHOS 61 04/06/2023   BILITOT 0.5 04/06/2023    Lab Results  Component Value Date   25OHVITD2 <1.0 09/14/2018    25OHVITD3 25 09/14/2018   VD25OH 14.89 (L) 04/06/2023    Lab Results  Component Value Date   TSH 2.09 04/06/2023    Assessment & Plan:   Problem List Items Addressed This Visit     Advanced care planning/counseling discussion (Chronic)    Previously discussed      Failed back surgical syndrome (Chronic)   Medicare annual wellness visit, subsequent - Primary (Chronic)    I have personally reviewed the Medicare Annual Wellness questionnaire and have noted 1. The patient's medical and social history 2. Their  use of alcohol, tobacco or illicit drugs 3. Their current medications and supplements 4. The patient's functional ability including ADL's, fall risks, home safety risks and hearing or visual impairment. Cognitive function has been assessed and addressed as indicated.  5. Diet and physical activity 6. Evidence for depression or mood disorders The patients weight, height, BMI have been recorded in the chart. I have made referrals, counseling and provided education to the patient based on review of the above and I have provided the pt with a written personalized care plan for preventive services. Provider list updated.. See scanned questionairre as needed for further documentation. Reviewed preventative protocols and updated unless pt declined.       Hypothyroidism    Chronic, stable on current regimen - continue this.       Relevant Medications   levothyroxine (SYNTHROID) 150 MCG tablet   Type 2 diabetes mellitus with peripheral neuropathy (HCC)    Chronic, stable on current regimen of ozempic 0.5mg  weekly and amaryl 1mg  daily.  Caution with higher ozempic dose due to h/o previous GI intolerance.       Relevant Medications   glimepiride (AMARYL) 1 MG tablet   simvastatin (ZOCOR) 40 MG tablet   gabapentin (NEURONTIN) 100 MG capsule   Hyperlipidemia associated with type 2 diabetes mellitus (HCC)    Chronic, good control on simvastatin 40mg  daily - continue. LDL 59. The  ASCVD Risk score (Arnett DK, et al., 2019) failed to calculate for the following reasons:   The patient has a prior MI or stroke diagnosis       Relevant Medications   glimepiride (AMARYL) 1 MG tablet   simvastatin (ZOCOR) 40 MG tablet   Normocytic anemia    Chronic, mild, anticipate anemia of chronic disease. Continue to monitor.       Bipolar disorder (HCC)    Chronic, on prozac and lamictal. Appreciate psych care.       Essential hypertension    Chronic, stable on current regimen - continue carvedilol, lasix PRN.       Relevant Medications   simvastatin (ZOCOR) 40 MG tablet   Asthma    Previously saw pulm Dr Thora Lance - however states symbicort no longer covered by insurance. Reviewing med section, it doesn't seem any LABA/ICS is covered by insurance, but triple controller inhalers are better covered. I'm not convinced she needs LAMA component at this time. I asked daughter to call insurance to see if there's any preferred LABA/ICS on formulary      Relevant Medications   montelukast (SINGULAIR) 10 MG tablet   GERD    Continues PPI BID Consider tapering dose with noted weight loss  Kidney function is stable.       Relevant Medications   pantoprazole (PROTONIX) 40 MG tablet   OSA treated with BiPAP   Morbid obesity with BMI of 60.0-69.9, adult (HCC)    Ongoing weight loss noted in setting of ozempic use - she is down 22 lbs since last year!       Relevant Medications   glimepiride (AMARYL) 1 MG tablet   Aortic atherosclerosis (HCC)    Continue aspirin, statin.       Relevant Medications   simvastatin (ZOCOR) 40 MG tablet   CAD (coronary artery disease)    Continue aspirin ,statin. Nonobstructive on catheterization 2021      Relevant Medications   simvastatin (ZOCOR) 40 MG tablet   Polypharmacy   Vitamin D insufficiency    Levels markedly low - she's not  been taking replacement regularly, will restart this. Advised increase to 3000-4000 international units daily   Declines weekly Rx.       Bladder spasms    Notes new dysuria - update UA.       Wound infection complicating hardware [facility])    This is followed by ID - appreciate their care.       Osteopenia    Continue dietary calcium intake, and vit d replacement.       HFrEF (heart failure with reduced ejection fraction) (HCC)   Relevant Medications   simvastatin (ZOCOR) 40 MG tablet   Chronic respiratory failure with hypoxia, on home O2 therapy (HCC)    Continue supplemental oxygen use.  Requests updated Rx - will mail to patient.       Obesity hypoventilation syndrome (HCC)   Relevant Medications   glimepiride (AMARYL) 1 MG tablet   Other Visit Diagnoses     Dysuria       Relevant Orders   POCT Urinalysis Dipstick (Automated) (Completed)        Meds ordered this encounter  Medications   glimepiride (AMARYL) 1 MG tablet    Sig: Take 1 tablet (1 mg total) by mouth daily with breakfast.    Dispense:  90 tablet    Refill:  4   levothyroxine (SYNTHROID) 150 MCG tablet    Sig: Take 1 tablet (150 mcg total) by mouth at bedtime.    Dispense:  90 tablet    Refill:  4   pantoprazole (PROTONIX) 40 MG tablet    Sig: Take 1 tablet (40 mg total) by mouth 2 (two) times daily.    Dispense:  180 tablet    Refill:  4   simvastatin (ZOCOR) 40 MG tablet    Sig: Take 1 tablet (40 mg total) by mouth every evening.    Dispense:  90 tablet    Refill:  4   gabapentin (NEURONTIN) 100 MG capsule    Sig: Take 1 capsule (100 mg total) by mouth at bedtime.    Dispense:  90 capsule    Refill:  4   montelukast (SINGULAIR) 10 MG tablet    Sig: Take 1 tablet (10 mg total) by mouth at bedtime.    Dispense:  90 tablet    Refill:  4    Orders Placed This Encounter  Procedures   POCT Urinalysis Dipstick (Automated)    Patient Instructions  Urinalysis today  If interested, check with pharmacy about new 2 shot shingles series (shingrix) and RSV vaccine.  Aim for vitamin D 3000-4000 unit  daily. Good to see you today  Return as needed or in 1 year for next wellness visit.   Call medicare to ask for options to replace Symbicort - want a combination long acting bronchodilator (LABA) and inhaled corticosteroid (ISC).   Follow up plan: Return in about 1 year (around 04/09/2024), or if symptoms worsen or fail to improve, for medicare wellness visit.  [name], MD

## 2023-04-11 NOTE — Assessment & Plan Note (Addendum)
Continues PPI BID Consider tapering dose with noted weight loss  Kidney function is stable.

## 2023-04-11 NOTE — Assessment & Plan Note (Signed)
Continue aspirin, statin.  

## 2023-04-11 NOTE — Assessment & Plan Note (Addendum)
Chronic, good control on simvastatin 40mg  daily - continue. LDL 59. The ASCVD Risk score (Arnett DK, et al., 2019) failed to calculate for the following reasons:   The patient has a prior MI or stroke diagnosis

## 2023-04-11 NOTE — Assessment & Plan Note (Signed)
Chronic, stable on current regimen - continue this.  

## 2023-04-11 NOTE — Assessment & Plan Note (Signed)
Continue dietary calcium intake, and vit d replacement.

## 2023-04-11 NOTE — Assessment & Plan Note (Signed)
Previously saw pulm Dr Thora Lance - however states symbicort no longer covered by insurance. Reviewing med section, it doesn't seem any LABA/ICS is covered by insurance, but triple controller inhalers are better covered. I'm not convinced she needs LAMA component at this time. I asked daughter to call insurance to see if there's any preferred LABA/ICS on formulary

## 2023-04-11 NOTE — Assessment & Plan Note (Signed)
Chronic, on prozac and lamictal. Appreciate psych care.

## 2023-04-11 NOTE — Assessment & Plan Note (Signed)
Continue supplemental oxygen use.  Requests updated Rx - will mail to patient.

## 2023-04-11 NOTE — Assessment & Plan Note (Signed)
Chronic, mild, anticipate anemia of chronic disease. Continue to monitor.

## 2023-04-11 NOTE — Assessment & Plan Note (Signed)
Chronic, stable on current regimen - continue carvedilol, lasix PRN.

## 2023-04-11 NOTE — Assessment & Plan Note (Signed)
Notes new dysuria - update UA.

## 2023-04-11 NOTE — Assessment & Plan Note (Signed)
This is followed by ID - appreciate their care.

## 2023-04-11 NOTE — Assessment & Plan Note (Signed)
Chronic, stable on current regimen of ozempic 0.5mg  weekly and amaryl 1mg  daily.  Caution with higher ozempic dose due to h/o previous GI intolerance.

## 2023-04-11 NOTE — Assessment & Plan Note (Signed)
Continue aspirin ,statin. Nonobstructive on catheterization 2021

## 2023-04-11 NOTE — Assessment & Plan Note (Signed)
Ongoing weight loss noted in setting of ozempic use - she is down 22 lbs since last year!

## 2023-04-11 NOTE — Assessment & Plan Note (Addendum)
Levels markedly low - she's not been taking replacement regularly, will restart this. Advised increase to 3000-4000 international units daily  Declines weekly Rx.

## 2023-04-13 ENCOUNTER — Telehealth: Payer: Self-pay | Admitting: Family Medicine

## 2023-04-13 NOTE — Telephone Encounter (Signed)
Faxed written order to [facility] at [phone].

## 2023-04-13 NOTE — Telephone Encounter (Signed)
Rx for oxygen concentrator and tanks written and placed in Lisa's box.  Pt requested this at [facility].

## 2023-04-14 ENCOUNTER — Other Ambulatory Visit: Payer: Self-pay | Admitting: Cardiology

## 2023-04-21 NOTE — Progress Notes (Signed)
Cardiology Clinic Note   Patient Name: Amy Barnes Date of Encounter: 04/27/2023  Primary Care Provider:  [name], MD Primary Cardiologist:  Amy Rotunda, MD  Patient Profile    72 year old female patient with history of nonischemic cardiomyopathy with an EF of 35 to 40% and apical wall motion abnormalities per echo in 2021.  Left heart catheterization was completed as a result of reduced EF showing normal coronary arteries.  She was treated with guideline directed medical therapy with Entresto Lasix and carvedilol.  She is chronically on O2 via nasal cannula.    She has a history of sleep apnea and uses BiPAP, followed by Dr. Tresa Barnes she has chronic lower extremity edema and uses Lasix as needed.  Last seen by Amy Barnes on 12/23/2021, at that time he ordered PFTs due to her oxygen dependence.  This revealed severe restrictive lung disease and she was referred to pulmonology as a new patient.  She is now being followed by Amy Barnes, pulmonologist.  Past Medical History    Past Medical History:  Diagnosis Date   Allergic rhinitis    Ambulates with cane    straight   Anemia    Anxiety    Asthma    seasonal   Bipolar affective disorder (HCC)    takes Synthroid meds for Bipolar   CAD (coronary artery disease) 07/2016   by CT scan   Carpal tunnel syndrome    had surgery but occasional still has some issues per patient   Cataract    Centrilobular emphysema (HCC) 07/2016   by CT scan - pt not aware of this   Depression with anxiety    Diabetes mellitus    type 2 - no meds diet controlled   GERD (gastroesophageal reflux disease)    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 - 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 PO   Hyperlipidemia    Hypertension    Hypothyroidism    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 01/16/2018   prevnar 13 - large localized reaction 12/2017   OSA (obstructive sleep apnea)    no longer using cpap, uses a bed that raises and lowers hob    Osteoarthritis    Osteopenia 08/11/2019   DEXA 07/2019 - T -1.1 R femur (osteopenia)   Pneumonia due to COVID-19 virus 05/03/2020   Restless legs    Septic arthritis (HCC) 10/11/2012   Shingles 06/30/2016   Thoracic aortic atherosclerosis (HCC) 11/207   by CT   Past Surgical History:  Procedure Laterality Date   BREAST CYST ASPIRATION     CARPAL TUNNEL RELEASE Right 05/15/2011   CARPAL TUNNEL RELEASE Left 12/24/2017   Procedure: LEFT CARPAL TUNNEL RELEASE;  Surgeon: Amy Loa, MD;  Location: [facility];  Service: Orthopedics;  Laterality: Left;   CERVICAL FUSION  2012   C2/3/4   COLONOSCOPY WITH PROPOFOL N/A 12/31/2015   diverticulosis, int hem, o/w normal rpt 10 yrs (Amy Barnes)   ESOPHAGOGASTRODUODENOSCOPY (EGD) WITH PROPOFOL N/A 11/12/2019   Procedure: ESOPHAGOGASTRODUODENOSCOPY (EGD) WITH PROPOFOL;  Surgeon: Amy Rist, MD;  Location: [facility];  Service: Gastroenterology;  Laterality: N/A;   EYE SURGERY Bilateral    cataract surgery with lens implant   FOOT SURGERY  1982   bone spur   I & D EXTREMITY  09/06/2012   Amy Palmer, MD; Right;  I&D of right thigh   I & D EXTREMITY Left 07/2013   wound vac - daily doxycycline indefinitely   KNEE  ARTHROSCOPY  09/01/2012   Amy Huh, MD;  Right   LEFT HEART CATH AND CORONARY ANGIOGRAPHY N/A 11/10/2019   Procedure: LEFT HEART CATH AND CORONARY ANGIOGRAPHY;  Surgeon: Amy Bihari, MD;  Location: [facility] INVASIVE CV LAB;  Service: Cardiovascular;  Laterality: N/A;   LUMBAR FUSION  01/08/2012   L3-4   LUMBAR FUSION  02/2019   unexpectedly discovered MRSA infection - pus Amy Barnes)    LUMBAR LAMINECTOMY/DECOMPRESSION MICRODISCECTOMY N/A 11/13/2016   LAMINOTOMY/LAMINECTOMY LUMBAR FOUR LUMBAR FIVE  WITH RESECTION OF SYNOVIAL CYST;  Surgeon: Amy Stalker, MD   NECK SURGERY     Herniated disk C2,3,4   NOSE SURGERY     PARTIAL HYSTERECTOMY  1984   for mennorhagia, ovaries remain   REVISION TOTAL HIP ARTHROPLASTY  Left 08/28/2011   TMJ ARTHROPLASTY  1982   TOTAL HIP ARTHROPLASTY Left 2002   TRIGGER FINGER RELEASE Right 05/15/2011   long finger    Allergies  Allergies  Allergen Reactions   Toviaz [Fesoterodine] Nausea And Vomiting    Severe reaction - s/p several ER visits then hospitalization ?stress induced cardiomyopathy   Cephalexin Hives   Hydrocodone Other (See Comments)    Reaction:  Hallucinations    Entresto [Sacubitril-Valsartan]    Macrodantin [Nitrofurantoin]     Dyspnea, lips peeling   Metformin And Related Other (See Comments)    Pedal edema   Tolterodine Tartrate Nausea And Vomiting   Risperidone And Related Other (See Comments)    Reaction:  Made pt excessively sleepy   Seroquel [Quetiapine Fumarate] Other (See Comments)    Reaction:  Made pt excessively sleepy   Sulfa Antibiotics Rash    Tolerated bactrim course well 06/2020    History of Present Illness    [name] comes today for ongoing assessment and management of nonischemic cardiomyopathy, EF of 35 to 40%, severe restrictive lung disease on oxygen via nasal cannula, normal coronary arteries per cardiac cath, hypertension.  She comes today for refills on furosemide, carvedilol, and potassium.  She has been medically compliant.  She continues to follow with pulmonology and is now on oxygen via nasal cannula at 3 L, sometimes up to 4 L but rarely.  She is still getting good response from diuretics.  She is not weighing herself daily and is trying to avoid salted foods.  Home Medications    Current Outpatient Medications  Medication Sig Dispense Refill   albuterol (PROVENTIL HFA;VENTOLIN HFA) 108 (90 Base) MCG/ACT inhaler Inhale 2 puffs into the lungs every 6 (six) hours as needed for wheezing or shortness of breath. 1 Inhaler 6   albuterol (PROVENTIL) (2.5 MG/3ML) 0.083% nebulizer solution Take 3 mLs (2.5 mg total) by nebulization every 6 (six) hours as needed for wheezing or shortness of breath. 75 mL 12    aspirin 81 MG EC tablet Take 81 mg by mouth at bedtime.     Cholecalciferol (VITAMIN D) 50 MCG (2000 UT) CAPS Take 1 capsule (2,000 Units total) by mouth daily. 30 capsule    cycloSPORINE (RESTASIS) 0.05 % ophthalmic emulsion Place 1 drop into both eyes 2 (two) times daily.     dicyclomine (BENTYL) 20 MG tablet Take 1 tablet (20 mg total) by mouth in the morning and at bedtime. (Patient taking differently: Take 20 mg by mouth in the morning and at bedtime. As needed) 60 tablet 2   doxycycline (VIBRA-TABS) 100 MG tablet TAKE 1 TABLET TWICE DAILY 180 tablet 3   ferrous sulfate 325 (65 FE) MG EC tablet  Take 1 tablet (325 mg total) by mouth every Monday, Wednesday, and Friday.     fluconazole (DIFLUCAN) 200 MG tablet Take 1 tablet (200 mg total) by mouth daily. X 7 days in addition to topical cream for candidal skin infeciton 14 tablet 1   gabapentin (NEURONTIN) 100 MG capsule Take 1 capsule (100 mg total) by mouth at bedtime. 90 capsule 4   glimepiride (AMARYL) 1 MG tablet Take 1 tablet (1 mg total) by mouth daily with breakfast. 90 tablet 4   lamoTRIgine (LAMICTAL) 200 MG tablet Take 400 mg by mouth daily.     levothyroxine (SYNTHROID) 150 MCG tablet Take 1 tablet (150 mcg total) by mouth at bedtime. 90 tablet 4   montelukast (SINGULAIR) 10 MG tablet Take 1 tablet (10 mg total) by mouth at bedtime. 90 tablet 4   Multiple Vitamin (MULTIVITAMIN WITH MINERALS) TABS tablet Take 2 tablets by mouth daily.      nystatin cream (MYCOSTATIN) Apply 1 Application topically 2 (two) times daily. 30 g 0   OZEMPIC, 0.25 OR 0.5 MG/DOSE, 2 MG/3ML SOPN Inject 0.5 mg into the skin every 7 (seven) days. 9 mL 3   pantoprazole (PROTONIX) 40 MG tablet Take 1 tablet (40 mg total) by mouth 2 (two) times daily. 180 tablet 4   prochlorperazine (COMPAZINE) 10 MG tablet Take 1 tablet (10 mg total) by mouth every 6 (six) hours as needed for nausea or vomiting. 30 tablet 1   PROZAC 20 MG capsule Take 60 mg by mouth daily.      simvastatin (ZOCOR) 40 MG tablet Take 1 tablet (40 mg total) by mouth every evening. 90 tablet 4   carvedilol (COREG) 6.25 MG tablet Take 1 tablet (6.25 mg total) by mouth 2 (two) times daily with a meal. Please keep upcoming appt for further refills. Thank you 180 tablet 3   furosemide (LASIX) 20 MG tablet Take 1 tablet (20 mg total) by mouth 2 (two) times daily. Please keep upcoming appt for further refills. Thank you 180 tablet 2   potassium chloride (KLOR-CON) 10 MEQ tablet Take 1 tablet (10 mEq total) by mouth daily. With lasix. May take an additional 10 meq when taking 60 meq of lasix 30 tablet 6   No current facility-administered medications for this visit.     Family History    Family History  Problem Relation Age of Onset   Aneurysm Father 57       brain   Alcohol abuse Father    Cancer Father        possibly   CAD Other        several siblings   Cancer Brother        prostate   Diabetes Brother    Diabetes Sister    Anesthesia problems Neg Hx    Hypotension Neg Hx    Malignant hyperthermia Neg Hx    Pseudochol deficiency Neg Hx    Breast cancer Neg Hx    She indicated that her mother is deceased. She indicated that her father is deceased. She indicated that her sister is deceased. She indicated that one of her two brothers is deceased. She indicated that the status of her neg hx is unknown. She indicated that the status of her other is unknown.  Social History    Social History   Socioeconomic History   Marital status: Married    Spouse name: Not on file   Number of children: 3   Years of education: Not on  file   Highest education level: Not on file  Occupational History   Occupation: Unemployed    Employer: UNEMPLOYED  Tobacco Use   Smoking status: Former    Current packs/day: 0.00    Average packs/day: 1.5 packs/day for 40.0 years (60.0 ttl pk-yrs)    Types: Cigarettes    Start date: 04/29/1969    Quit date: 04/29/2009    Years since quitting: 14.0    Smokeless tobacco: Never  Vaping Use   Vaping status: Never Used  Substance and Sexual Activity   Alcohol use: Yes    Alcohol/week: 2.0 standard drinks of alcohol    Types: 2 Shots of liquor per week    Comment: 2 shots/weekly   Drug use: No   Sexual activity: Not Currently    Comment: Hysterectomy  Other Topics Concern   Not on file  Social History Narrative   Married   3 children   Social Determinants of Health   Financial Resource Strain: Low Risk  (12/11/2020)   Overall Financial Resource Strain (CARDIA)    Difficulty of Paying Living Expenses: Not hard at all  Food Insecurity: No Food Insecurity (12/11/2020)   Hunger Vital Sign    Worried About Running Out of Food in the Last Year: Never true    Ran Out of Food in the Last Year: Never true  Transportation Needs: No Transportation Needs (01/20/2020)   PRAPARE - Administrator, Civil Service (Medical): No    Lack of Transportation (Non-Medical): No  Physical Activity: Inactive (12/11/2020)   Exercise Vital Sign    Days of Exercise per Week: 0 days    Minutes of Exercise per Session: 0 min  Stress: Stress Concern Present (01/20/2020)   [employer] of Occupational Health - Occupational Stress Questionnaire    Feeling of Stress : To some extent  Social Connections: Socially Integrated (12/11/2020)   Social Connection and Isolation Panel [NHANES]    Frequency of Communication with Friends and Family: More than three times a week    Frequency of Social Gatherings with Friends and Family: More than three times a week    Attends Religious Services: More than 4 times per year    Active Member of [employer] or Organizations: Yes    Attends Banker Meetings: Not on file    Marital Status: Married  Intimate Partner Violence: Not At Risk (12/11/2020)   Humiliation, Afraid, Rape, and Kick questionnaire    Fear of Current or Ex-Partner: No    Emotionally Abused: No    Physically Abused: No    Sexually Abused:  No     Review of Systems    General:  No chills, fever, night sweats or weight changes.  Cardiovascular:  No chest pain, dyspnea on exertion, edema, orthopnea, palpitations, paroxysmal nocturnal dyspnea. Dermatological: No rash, lesions/masses Respiratory: No cough, dyspnea Urologic: No hematuria, dysuria Abdominal:   No nausea, vomiting, diarrhea, bright red blood per rectum, melena, or hematemesis Neurologic:  No visual changes, wkns, changes in mental status. All other systems reviewed and are otherwise negative except as noted above.       Physical Exam    VS:  BP 112/72 (BP Location: Right Wrist, Patient Position: Sitting, Cuff Size: Normal)   Pulse 65   Ht 5\' 3"  (1.6 m)   Wt (!) 359 lb 12.8 oz (163.2 kg)   LMP  (LMP Unknown)   SpO2 96%   BMI 63.74 kg/m  , BMI Body mass index is 63.74  kg/m.     GEN: Well nourished, well developed, in no acute distress.  Morbidly obese sitting in a wheelchair HEENT: normal. Neck: Supple, no JVD, carotid bruits, or masses. Cardiac: RRR, no murmurs, rubs, or gallops. No clubbing, cyanosis, edema.  Radials/DP/PT 2+ and equal bilaterally.  Respiratory:  Respirations regular and unlabored, clear to auscultation bilaterally.  Wearing O2 via nasal cannula GI: Soft, nontender, nondistended, BS + x 4. MS: no deformity or atrophy. Skin: warm and dry, no rash. Neuro:  Strength and sensation are intact. Psych: Normal affect.      Lab Results  Component Value Date   WBC 6.3 04/06/2023   HGB 11.8 (L) 04/06/2023   HCT 36.5 04/06/2023   MCV 88.2 04/06/2023   PLT 224.0 04/06/2023   Lab Results  Component Value Date   CREATININE 0.75 04/06/2023   BUN 17 04/06/2023   NA 141 04/06/2023   K 4.3 04/06/2023   CL 101 04/06/2023   CO2 33 (H) 04/06/2023   Lab Results  Component Value Date   ALT 16 04/06/2023   AST 14 04/06/2023   ALKPHOS 61 04/06/2023   BILITOT 0.5 04/06/2023   Lab Results  Component Value Date   CHOL 125 04/06/2023    HDL 48.60 04/06/2023   LDLCALC 59 04/06/2023   LDLDIRECT 48.0 03/13/2016   TRIG 86.0 04/06/2023   CHOLHDL 3 04/06/2023    Lab Results  Component Value Date   HGBA1C 6.6 (H) 04/06/2023     Review of Prior Studies   Echocardiogram 04/23/2022 with bubble study 1. Left ventricular ejection fraction, by estimation, is 55 to 60%. The  left ventricle has normal function. The left ventricle has no regional  wall motion abnormalities. There is mild left ventricular hypertrophy.  Left ventricular diastolic parameters  are indeterminate.   2. Right ventricular systolic function was not well visualized. The right  ventricular size is not well visualized.   3. The mitral valve is degenerative. Trivial mitral valve regurgitation.  Severe mitral annular calcification.   4. The aortic valve is tricuspid. Aortic valve regurgitation is not  visualized. No aortic stenosis is present.   5. The inferior vena cava is dilated in size with <50% respiratory  variability, suggesting right atrial pressure of 15 mmHg.   6. Agitated saline contrast bubble study was technically difficult study,  though no clear shunting seen   Comparison(s): 02/20/20 EF 60-65%.    Assessment & Plan   1.  Nonischemic cardiomyopathy: Patient's EF 35 to 40%.  She is not very active.  Denies any significant volume overload or need to take extra Lasix often.  She will remain on 40 mg of Lasix daily and take an extra 20 mg as needed for weight gain or edema.  Continue carvedilol 6.25 mg twice daily as directed with refills provided.  She is also given refills on potassium 10 mEq which she only takes rarely when she takes the extra dose of furosemide.  No changes in her regimen or any further testing planned at this time.  2.  Restrictive lung disease: Followed by pulmonary remains on albuterol inhaler and O2.  3.  Hypertension: Currently well-controlled.  Continue carvedilol as directed.  4.  Hypercholesterolemia: Remains on  simvastatin 40 mg daily.  Goal of LDL less than 100.       Signed, Bettey Mare. Liborio Nixon, ANP, AACC   04/27/2023 5:14 PM      Office [phone] Fax [phone]  Notice: This dictation was prepared with  Dragon dictation along with smaller Lobbyist. Any transcriptional errors that result from this process are unintentional and may not be corrected upon review.

## 2023-04-22 ENCOUNTER — Encounter: Payer: Self-pay | Admitting: Family Medicine

## 2023-04-27 ENCOUNTER — Encounter: Payer: Self-pay | Admitting: Adult Health

## 2023-04-27 ENCOUNTER — Ambulatory Visit: Payer: Medicare Other | Admitting: Adult Health

## 2023-04-27 VITALS — BP 112/72 | HR 65 | Ht 63.0 in | Wt 359.8 lb

## 2023-04-27 DIAGNOSIS — E7800 Pure hypercholesterolemia, unspecified: Secondary | ICD-10-CM | POA: Insufficient documentation

## 2023-04-27 DIAGNOSIS — J984 Other disorders of lung: Secondary | ICD-10-CM | POA: Insufficient documentation

## 2023-04-27 DIAGNOSIS — I10 Essential (primary) hypertension: Secondary | ICD-10-CM | POA: Diagnosis not present

## 2023-04-27 DIAGNOSIS — I5022 Chronic systolic (congestive) heart failure: Secondary | ICD-10-CM | POA: Insufficient documentation

## 2023-04-27 MED ORDER — POTASSIUM CHLORIDE ER 10 MEQ PO TBCR: 10.0000 meq | EXTENDED_RELEASE_TABLET | Freq: Every day | ORAL | 6 refills | Status: DC

## 2023-04-27 MED ORDER — CARVEDILOL 6.25 MG PO TABS: 6.2500 mg | ORAL_TABLET | Freq: Two times a day (BID) | ORAL | 3 refills | Status: DC

## 2023-04-27 MED ORDER — FUROSEMIDE 20 MG PO TABS: 20.0000 mg | ORAL_TABLET | Freq: Two times a day (BID) | ORAL | 2 refills | Status: DC

## 2023-04-27 NOTE — Patient Instructions (Signed)
Medication Instructions:  No Changes *If you need a refill on your cardiac medications before your next appointment, please call your pharmacy*   Lab Work: No labs If you have labs (blood work) drawn today and your tests are completely normal, you will receive your results only by: MyChart Message (if you have MyChart) OR A paper copy in the mail If you have any lab test that is abnormal or we need to change your treatment, we will call you to review the results.   Testing/Procedures: No Testing   Follow-Up: At [facility], you and your health needs are our priority.  As part of our continuing mission to provide you with exceptional heart care, we have created designated Provider Care Teams.  These Care Teams include your primary Cardiologist (physician) and Advanced Practice Providers (APPs -  Physician Assistants and Nurse Practitioners) who all work together to provide you with the care you need, when you need it.  We recommend signing up for the patient portal called "MyChart".  Sign up information is provided on this After Visit Summary.  MyChart is used to connect with patients for Virtual Visits (Telemedicine).  Patients are able to view lab/test results, encounter notes, upcoming appointments, etc.  Non-urgent messages can be sent to your provider as well.   To learn more about what you can do with MyChart, go to [email or web address].    Your next appointment:   1 year(s)  Provider:   Rollene Rotunda, MD

## 2023-06-15 ENCOUNTER — Telehealth: Payer: Self-pay | Admitting: Cardiovascular Disease

## 2023-06-15 DIAGNOSIS — G4733 Obstructive sleep apnea (adult) (pediatric): Secondary | ICD-10-CM

## 2023-06-15 NOTE — Telephone Encounter (Signed)
What problem are you experiencing? Company is no longer selling BiPAP supplies  Who is your medical equipment company? [employer]   3)    If patient is calling about their sleep study results please route to CV DIV Sleep Study Pool.  [employer] advised they no longer provide BiPAP supplies. They recommended [facility].   Daughter contacted Christoper Allegra who advised a prescription needs to be faxed to [phone]   Please route to the sleep study coordinator.

## 2023-06-16 NOTE — Telephone Encounter (Signed)
Prescription for CPAP and supplies sent via Epic Portal and faxed to [facility] 06/16/23

## 2023-06-17 ENCOUNTER — Ambulatory Visit (INDEPENDENT_AMBULATORY_CARE_PROVIDER_SITE_OTHER): Payer: Medicare Other | Admitting: Family Medicine

## 2023-06-17 ENCOUNTER — Encounter: Payer: Self-pay | Admitting: Family Medicine

## 2023-06-17 VITALS — BP 138/88 | HR 70 | Temp 98.2°F

## 2023-06-17 DIAGNOSIS — J9611 Chronic respiratory failure with hypoxia: Secondary | ICD-10-CM

## 2023-06-17 DIAGNOSIS — Z9981 Dependence on supplemental oxygen: Secondary | ICD-10-CM

## 2023-06-17 DIAGNOSIS — T847XXS Infection and inflammatory reaction due to other internal orthopedic prosthetic devices, implants and grafts, sequela: Secondary | ICD-10-CM | POA: Diagnosis not present

## 2023-06-17 DIAGNOSIS — J4520 Mild intermittent asthma, uncomplicated: Secondary | ICD-10-CM | POA: Diagnosis not present

## 2023-06-17 DIAGNOSIS — J432 Centrilobular emphysema: Secondary | ICD-10-CM

## 2023-06-17 DIAGNOSIS — G4733 Obstructive sleep apnea (adult) (pediatric): Secondary | ICD-10-CM

## 2023-06-17 DIAGNOSIS — J441 Chronic obstructive pulmonary disease with (acute) exacerbation: Secondary | ICD-10-CM | POA: Insufficient documentation

## 2023-06-17 DIAGNOSIS — E1142 Type 2 diabetes mellitus with diabetic polyneuropathy: Secondary | ICD-10-CM

## 2023-06-17 DIAGNOSIS — E662 Morbid (severe) obesity with alveolar hypoventilation: Secondary | ICD-10-CM

## 2023-06-17 LAB — POC INFLUENZA A&B (BINAX/QUICKVUE)
Influenza A, POC: NEGATIVE
Influenza B, POC: NEGATIVE

## 2023-06-17 LAB — POC COVID19 BINAXNOW: SARS Coronavirus 2 Ag: NEGATIVE

## 2023-06-17 MED ORDER — AMOXICILLIN-POT CLAVULANATE 875-125 MG PO TABS: 1.0000 | ORAL_TABLET | Freq: Two times a day (BID) | ORAL | 0 refills | Status: AC

## 2023-06-17 MED ORDER — PREDNISONE 20 MG PO TABS: ORAL_TABLET | ORAL | 0 refills | Status: DC

## 2023-06-17 MED ORDER — IPRATROPIUM-ALBUTEROL 0.5-2.5 (3) MG/3ML IN SOLN
3.0000 mL | Freq: Once | RESPIRATORY_TRACT | Status: AC
Start: 2023-06-17 — End: 2023-06-17
  Administered 2023-06-17: 3 mL via RESPIRATORY_TRACT

## 2023-06-17 MED ORDER — IPRATROPIUM-ALBUTEROL 0.5-2.5 (3) MG/3ML IN SOLN: 3.0000 mL | RESPIRATORY_TRACT | 0 refills | Status: DC | PRN

## 2023-06-17 NOTE — Patient Instructions (Addendum)
COVID and flu swabs negative today.  Possible COPD exacerbation despite regular doxycycline use.  Take prednisone course sent to pharmacy as well as start augmentin antibiotic sent to pharmacy. Drop doxycycline to once daily while on augmentin.  May use duoneb combination nebulizer treatment for next few days.  Let us know if not improving with treatment.  If worsening symptoms, seek urgent care.

## 2023-06-17 NOTE — Assessment & Plan Note (Addendum)
Anticipate COPD exacerbation given increased dyspnea and increased sputum production/purulence. Flu and COVID swabs negative today.  Rx in office albuterol/atrovent nebulization with benefit. Already on doxycycline for hardware infection, she also has multiple abx intolerances/allergies. Avoiding macrolides in h/o QT prolongation. She states she tolerates amoxicillin well.  Rx prednisone taper, augmentin 10d antibiotic course.  Rec restart daily symbicort controller inhaler. Rx duoneb nebs to use at home PRN for COPD exacerbation.  Red flags to seek ER/urgent care reviewed.  Overdue for pulm f/u - advised call and schedule this.

## 2023-06-17 NOTE — Progress Notes (Signed)
Ph: [phone] Fax: [phone]   Patient ID: Amy Barnes, female    DOB: 03/17/1951, 72 y.o.   MRN: [id number]  This visit was conducted in person.  BP 138/88   Pulse 70   Temp 98.2 F (36.8 C)   LMP  (LMP Unknown)   SpO2 98% Comment: 4LNC   CC: respiratory infection  Subjective:   HPI: Amy Barnes is a 72 y.o. female presenting on 06/17/2023 for Cough (Started a week ago; Severe cough, phelgm, pt states already has breathing trouble, coughing makes it worse. Feels fatigued very often. Pt states she has been taking Mucinex, Dayquil, and some tylenol, does not touch. Has been doing breathing treatments a few times a day.)   10 d h/o progressively worsening productive cough, dyspnea, fatigue. Initial ST. Abd muscles hurt from coughing. Coughing fits. CPAP helps her sleep, also helps her breathing. Symptoms worse at night time.   No fevers/chills, ear or tooth pain, HA, head and chest congestion.   COVID swab negative at home.  Daughter recently sick.   Treating with tylenol, mucinex, dayquil without benefit. Using albuterol breathing treatments BID.   H/o qt prolongation - avoiding macrolides.      Relevant past medical, surgical, family and social history reviewed and updated as indicated. Interim medical history since our last visit reviewed. Allergies and medications reviewed and updated. Outpatient Medications Prior to Visit  Medication Sig Dispense Refill   albuterol (PROVENTIL HFA;VENTOLIN HFA) 108 (90 Base) MCG/ACT inhaler Inhale 2 puffs into the lungs every 6 (six) hours as needed for wheezing or shortness of breath. 1 Inhaler 6   albuterol (PROVENTIL) (2.5 MG/3ML) 0.083% nebulizer solution Take 3 mLs (2.5 mg total) by nebulization every 6 (six) hours as needed for wheezing or shortness of breath. 75 mL 12   aspirin 81 MG EC tablet Take 81 mg by mouth at bedtime.     carvedilol (COREG) 6.25 MG tablet Take 1 tablet (6.25 mg total) by mouth 2 (two) times daily  with a meal. Please keep upcoming appt for further refills. Thank you 180 tablet 3   Cholecalciferol (VITAMIN D) 50 MCG (2000 UT) CAPS Take 1 capsule (2,000 Units total) by mouth daily. 30 capsule    cycloSPORINE (RESTASIS) 0.05 % ophthalmic emulsion Place 1 drop into both eyes 2 (two) times daily.     dicyclomine (BENTYL) 20 MG tablet Take 1 tablet (20 mg total) by mouth in the morning and at bedtime. (Patient taking differently: Take 20 mg by mouth in the morning and at bedtime. As needed) 60 tablet 2   doxycycline (VIBRA-TABS) 100 MG tablet TAKE 1 TABLET TWICE DAILY 180 tablet 3   ferrous sulfate 325 (65 FE) MG EC tablet Take 1 tablet (325 mg total) by mouth every Monday, Wednesday, and Friday.     fluconazole (DIFLUCAN) 200 MG tablet Take 1 tablet (200 mg total) by mouth daily. X 7 days in addition to topical cream for candidal skin infeciton 14 tablet 1   furosemide (LASIX) 20 MG tablet Take 1 tablet (20 mg total) by mouth 2 (two) times daily. Please keep upcoming appt for further refills. Thank you 180 tablet 2   gabapentin (NEURONTIN) 100 MG capsule Take 1 capsule (100 mg total) by mouth at bedtime. 90 capsule 4   glimepiride (AMARYL) 1 MG tablet Take 1 tablet (1 mg total) by mouth daily with breakfast. 90 tablet 4   lamoTRIgine (LAMICTAL) 200 MG tablet Take 400 mg by mouth daily.  levothyroxine (SYNTHROID) 150 MCG tablet Take 1 tablet (150 mcg total) by mouth at bedtime. 90 tablet 4   montelukast (SINGULAIR) 10 MG tablet Take 1 tablet (10 mg total) by mouth at bedtime. 90 tablet 4   Multiple Vitamin (MULTIVITAMIN WITH MINERALS) TABS tablet Take 2 tablets by mouth daily.      nystatin cream (MYCOSTATIN) Apply 1 Application topically 2 (two) times daily. 30 g 0   OZEMPIC, 0.25 OR 0.5 MG/DOSE, 2 MG/3ML SOPN Inject 0.5 mg into the skin every 7 (seven) days. 9 mL 3   pantoprazole (PROTONIX) 40 MG tablet Take 1 tablet (40 mg total) by mouth 2 (two) times daily. 180 tablet 4   potassium chloride  (KLOR-CON) 10 MEQ tablet Take 1 tablet (10 mEq total) by mouth daily. With lasix. May take an additional 10 meq when taking 60 meq of lasix 30 tablet 6   prochlorperazine (COMPAZINE) 10 MG tablet Take 1 tablet (10 mg total) by mouth every 6 (six) hours as needed for nausea or vomiting. 30 tablet 1   PROZAC 20 MG capsule Take 60 mg by mouth daily.     simvastatin (ZOCOR) 40 MG tablet Take 1 tablet (40 mg total) by mouth every evening. 90 tablet 4   budesonide-formoterol (SYMBICORT) 160-4.5 MCG/ACT inhaler Inhale 2 puffs into the lungs 2 (two) times daily.     No facility-administered medications prior to visit.     Per HPI unless specifically indicated in ROS section below Review of Systems  Objective:  BP 138/88   Pulse 70   Temp 98.2 F (36.8 C)   LMP  (LMP Unknown)   SpO2 98% Comment: 4LNC  Wt Readings from Last 3 Encounters:  04/27/23 (!) 359 lb 12.8 oz (163.2 kg)  04/10/23 (!) 355 lb 2 oz (161.1 kg)  05/06/22 (!) 369 lb 12.8 oz (167.7 kg)      Physical Exam Vitals and nursing note reviewed.  Constitutional:      Appearance: Normal appearance. She is not ill-appearing.     Comments: Sitting in wheelchair  HENT:     Head: Normocephalic and atraumatic.     Right Ear: Tympanic membrane, ear canal and external ear normal. There is no impacted cerumen.     Left Ear: Tympanic membrane, ear canal and external ear normal. There is no impacted cerumen.     Mouth/Throat:     Comments: Wearing mask Eyes:     Extraocular Movements: Extraocular movements intact.     Conjunctiva/sclera: Conjunctivae normal.     Pupils: Pupils are equal, round, and reactive to light.  Cardiovascular:     Rate and Rhythm: Normal rate and regular rhythm.     Pulses: Normal pulses.     Heart sounds: Normal heart sounds. No murmur heard. Pulmonary:     Breath sounds: Wheezing (diffuse expiratory wheezing) and rhonchi (diffusely, L>R) present. No rales.     Comments:  Tight breath sounds throughout with  coughing fits. After alb/atrovent neb, improved air movement and decreased cough but with persistent wheeze Lymphadenopathy:     Head:     Right side of head: No submental, submandibular, tonsillar, preauricular or posterior auricular adenopathy.     Left side of head: No submental, submandibular, tonsillar, preauricular or posterior auricular adenopathy.     Cervical: No cervical adenopathy.     Right cervical: No superficial cervical adenopathy.    Left cervical: No superficial cervical adenopathy.     Upper Body:     Right upper  body: No supraclavicular adenopathy.     Left upper body: No supraclavicular adenopathy.  Skin:    Findings: No rash.  Neurological:     Mental Status: She is alert.  Psychiatric:        Mood and Affect: Mood normal.        Behavior: Behavior normal.       Results for orders placed or performed in visit on 06/17/23  POC COVID-19 BinaxNow  Result Value Ref Range   SARS Coronavirus 2 Ag Negative Negative  POC Influenza A&B (Binax test)  Result Value Ref Range   Influenza A, POC Negative Negative   Influenza B, POC Negative Negative   Lab Results  Component Value Date   HGBA1C 6.6 (H) 04/06/2023   Assessment & Plan:   Problem List Items Addressed This Visit     Type 2 diabetes mellitus with peripheral neuropathy (HCC)    Discussed hyperglycemia precautions while on steroids - avoid carbs/sugars.      Asthma   Relevant Medications   predniSONE (DELTASONE) 20 MG tablet   budesonide-formoterol (SYMBICORT) 160-4.5 MCG/ACT inhaler   ipratropium-albuterol (DUONEB) 0.5-2.5 (3) MG/3ML SOLN   OSA treated with BiPAP   Centrilobular emphysema (HCC)    Asthma -COPD overlap syndrome.  It seems she's only been taking PRN albuterol. Rec restart symbicort 160 2 puffs BID daily controller inhaler.  Also overdue for pulm f/u - encouraged she call to schedule appt.       Relevant Medications   predniSONE (DELTASONE) 20 MG tablet   budesonide-formoterol  (SYMBICORT) 160-4.5 MCG/ACT inhaler   ipratropium-albuterol (DUONEB) 0.5-2.5 (3) MG/3ML SOLN   Wound infection complicating hardware (HCC)    Chronic issue followed by ID on doxycycline 100mg  BID regularly.  Recommend she drop doxy to once daily while on augmentin due to drug interaction.      Chronic respiratory failure with hypoxia, on home O2 therapy (HCC)   Obesity hypoventilation syndrome (HCC)   COPD exacerbation (HCC) - Primary    Anticipate COPD exacerbation given increased dyspnea and increased sputum production/purulence. Flu and COVID swabs negative today.  Rx in office albuterol/atrovent nebulization with benefit. Already on doxycycline for hardware infection, she also has multiple abx intolerances/allergies. Avoiding macrolides in h/o QT prolongation. She states she tolerates amoxicillin well.  Rx prednisone taper, augmentin 10d antibiotic course.  Rec restart daily symbicort controller inhaler. Rx duoneb nebs to use at home PRN for COPD exacerbation.  Red flags to seek ER/urgent care reviewed.  Overdue for pulm f/u - advised call and schedule this.       Relevant Medications   predniSONE (DELTASONE) 20 MG tablet   budesonide-formoterol (SYMBICORT) 160-4.5 MCG/ACT inhaler   ipratropium-albuterol (DUONEB) 0.5-2.5 (3) MG/3ML SOLN   Other Relevant Orders   POC COVID-19 BinaxNow (Completed)   POC Influenza A&B (Binax test) (Completed)     Meds ordered this encounter  Medications   ipratropium-albuterol (DUONEB) 0.5-2.5 (3) MG/3ML nebulizer solution 3 mL   predniSONE (DELTASONE) 20 MG tablet    Sig: Take two tablets daily for 3 days followed by one tablet daily for 4 days    Dispense:  10 tablet    Refill:  0   amoxicillin-clavulanate (AUGMENTIN) 875-125 MG tablet    Sig: Take 1 tablet by mouth 2 (two) times daily for 10 days.    Dispense:  20 tablet    Refill:  0   ipratropium-albuterol (DUONEB) 0.5-2.5 (3) MG/3ML SOLN    Sig: Take 3 mLs by nebulization  every 4  (four) hours as needed.    Dispense:  90 mL    Refill:  0    Orders Placed This Encounter  Procedures   POC COVID-19 BinaxNow    Order Specific Question:   Previously tested for COVID-19    Answer:   Yes    Order Specific Question:   Resident in a congregate (group) care setting    Answer:   No    Order Specific Question:   Employed in healthcare setting    Answer:   No    Order Specific Question:   Pregnant    Answer:   No   POC Influenza A&B (Binax test)    Patient Instructions  COVID and flu swabs negative today.  Possible COPD exacerbation despite regular doxycycline use.  Take prednisone course sent to pharmacy as well as start augmentin antibiotic sent to pharmacy. Drop doxycycline to once daily while on augmentin.  May use duoneb combination nebulizer treatment for next few days.  Let us know if not improving with treatment.  If worsening symptoms, seek urgent care.   Follow up plan: Return if symptoms worsen or fail to improve.  [name], MD

## 2023-06-17 NOTE — Assessment & Plan Note (Signed)
Discussed hyperglycemia precautions while on steroids - avoid carbs/sugars.

## 2023-06-17 NOTE — Assessment & Plan Note (Addendum)
Chronic issue followed by ID on doxycycline 100mg  BID regularly.  Recommend she drop doxy to once daily while on augmentin due to drug interaction.

## 2023-06-17 NOTE — Assessment & Plan Note (Signed)
Asthma -COPD overlap syndrome.  It seems she's only been taking PRN albuterol. Rec restart symbicort 160 2 puffs BID daily controller inhaler.  Also overdue for pulm f/u - encouraged she call to schedule appt.

## 2023-06-18 ENCOUNTER — Other Ambulatory Visit: Payer: Self-pay | Admitting: Family Medicine

## 2023-06-18 ENCOUNTER — Ambulatory Visit: Payer: Medicare Other | Admitting: Internal Medicine

## 2023-06-19 ENCOUNTER — Telehealth: Payer: Self-pay | Admitting: Cardiovascular Disease

## 2023-06-19 NOTE — Telephone Encounter (Signed)
Daughter [name]) stated patient has been out of supplies and she wants assistance in getting set up with a new company.

## 2023-06-19 NOTE — Telephone Encounter (Signed)
Just prescribed this week.

## 2023-06-25 DIAGNOSIS — F3181 Bipolar II disorder: Secondary | ICD-10-CM | POA: Diagnosis not present

## 2023-09-17 DIAGNOSIS — F3181 Bipolar II disorder: Secondary | ICD-10-CM | POA: Diagnosis not present

## 2023-10-12 ENCOUNTER — Ambulatory Visit: Payer: Medicare Other | Admitting: Cardiovascular Disease

## 2023-12-02 ENCOUNTER — Other Ambulatory Visit: Payer: Self-pay | Admitting: Adult Health

## 2023-12-17 DIAGNOSIS — F3181 Bipolar II disorder: Secondary | ICD-10-CM | POA: Diagnosis not present

## 2023-12-31 ENCOUNTER — Ambulatory Visit: Payer: Medicare Other | Admitting: Cardiovascular Disease

## 2024-01-10 ENCOUNTER — Other Ambulatory Visit: Payer: Self-pay | Admitting: Internal Medicine

## 2024-02-04 ENCOUNTER — Other Ambulatory Visit: Payer: Self-pay | Admitting: Internal Medicine

## 2024-02-04 ENCOUNTER — Other Ambulatory Visit: Payer: Self-pay

## 2024-02-04 DIAGNOSIS — A4902 Methicillin resistant Staphylococcus aureus infection, unspecified site: Secondary | ICD-10-CM

## 2024-02-04 MED ORDER — FLUCONAZOLE 200 MG PO TABS: 200.0000 mg | ORAL_TABLET | Freq: Every day | ORAL | 1 refills | Status: DC

## 2024-02-05 ENCOUNTER — Other Ambulatory Visit: Payer: Self-pay

## 2024-02-05 MED ORDER — FLUCONAZOLE 150 MG PO TABS: 150.0000 mg | ORAL_TABLET | Freq: Every day | ORAL | 0 refills | Status: DC

## 2024-02-19 DIAGNOSIS — H16223 Keratoconjunctivitis sicca, not specified as Sjogren's, bilateral: Secondary | ICD-10-CM | POA: Diagnosis not present

## 2024-02-19 DIAGNOSIS — H35342 Macular cyst, hole, or pseudohole, left eye: Secondary | ICD-10-CM | POA: Diagnosis not present

## 2024-02-19 DIAGNOSIS — E119 Type 2 diabetes mellitus without complications: Secondary | ICD-10-CM | POA: Diagnosis not present

## 2024-02-19 DIAGNOSIS — H04123 Dry eye syndrome of bilateral lacrimal glands: Secondary | ICD-10-CM | POA: Diagnosis not present

## 2024-02-19 DIAGNOSIS — Z9841 Cataract extraction status, right eye: Secondary | ICD-10-CM | POA: Diagnosis not present

## 2024-02-19 DIAGNOSIS — Z9842 Cataract extraction status, left eye: Secondary | ICD-10-CM | POA: Diagnosis not present

## 2024-02-19 DIAGNOSIS — H5213 Myopia, bilateral: Secondary | ICD-10-CM | POA: Diagnosis not present

## 2024-02-19 LAB — HM DIABETES EYE EXAM

## 2024-02-26 DIAGNOSIS — H3589 Other specified retinal disorders: Secondary | ICD-10-CM | POA: Diagnosis not present

## 2024-02-26 DIAGNOSIS — H43813 Vitreous degeneration, bilateral: Secondary | ICD-10-CM | POA: Diagnosis not present

## 2024-02-26 DIAGNOSIS — H35342 Macular cyst, hole, or pseudohole, left eye: Secondary | ICD-10-CM | POA: Diagnosis not present

## 2024-02-26 DIAGNOSIS — H35033 Hypertensive retinopathy, bilateral: Secondary | ICD-10-CM | POA: Diagnosis not present

## 2024-03-02 ENCOUNTER — Encounter: Payer: Self-pay | Admitting: Family Medicine

## 2024-03-03 ENCOUNTER — Other Ambulatory Visit: Payer: Self-pay | Admitting: Adult Health

## 2024-03-10 DIAGNOSIS — F3181 Bipolar II disorder: Secondary | ICD-10-CM | POA: Diagnosis not present

## 2024-03-15 ENCOUNTER — Encounter: Payer: Self-pay | Admitting: Cardiovascular Disease

## 2024-03-15 ENCOUNTER — Encounter: Payer: Self-pay | Admitting: Family Medicine

## 2024-03-15 ENCOUNTER — Ambulatory Visit: Attending: Cardiovascular Disease | Admitting: Cardiovascular Disease

## 2024-03-15 VITALS — BP 114/74 | HR 60 | Ht 64.0 in | Wt 375.0 lb

## 2024-03-15 DIAGNOSIS — Z6841 Body Mass Index (BMI) 40.0 and over, adult: Secondary | ICD-10-CM | POA: Insufficient documentation

## 2024-03-15 DIAGNOSIS — F3170 Bipolar disorder, currently in remission, most recent episode unspecified: Secondary | ICD-10-CM

## 2024-03-15 DIAGNOSIS — E559 Vitamin D deficiency, unspecified: Secondary | ICD-10-CM

## 2024-03-15 DIAGNOSIS — J984 Other disorders of lung: Secondary | ICD-10-CM | POA: Diagnosis not present

## 2024-03-15 DIAGNOSIS — I428 Other cardiomyopathies: Secondary | ICD-10-CM | POA: Diagnosis not present

## 2024-03-15 DIAGNOSIS — G4733 Obstructive sleep apnea (adult) (pediatric): Secondary | ICD-10-CM | POA: Insufficient documentation

## 2024-03-15 DIAGNOSIS — E7800 Pure hypercholesterolemia, unspecified: Secondary | ICD-10-CM | POA: Insufficient documentation

## 2024-03-15 DIAGNOSIS — R0609 Other forms of dyspnea: Secondary | ICD-10-CM | POA: Insufficient documentation

## 2024-03-15 DIAGNOSIS — D649 Anemia, unspecified: Secondary | ICD-10-CM

## 2024-03-15 DIAGNOSIS — E1142 Type 2 diabetes mellitus with diabetic polyneuropathy: Secondary | ICD-10-CM

## 2024-03-15 DIAGNOSIS — I10 Essential (primary) hypertension: Secondary | ICD-10-CM | POA: Insufficient documentation

## 2024-03-15 DIAGNOSIS — E039 Hypothyroidism, unspecified: Secondary | ICD-10-CM

## 2024-03-15 DIAGNOSIS — T8450XS Infection and inflammatory reaction due to unspecified internal joint prosthesis, sequela: Secondary | ICD-10-CM

## 2024-03-15 DIAGNOSIS — I5022 Chronic systolic (congestive) heart failure: Secondary | ICD-10-CM

## 2024-03-15 DIAGNOSIS — E1169 Type 2 diabetes mellitus with other specified complication: Secondary | ICD-10-CM

## 2024-03-15 DIAGNOSIS — T847XXS Infection and inflammatory reaction due to other internal orthopedic prosthetic devices, implants and grafts, sequela: Secondary | ICD-10-CM

## 2024-03-15 NOTE — Patient Instructions (Addendum)
 Medication Instructions:  Your physician recommends that you continue on your current medications as directed. Please refer to the Current Medication list given to you today.  *If you need a refill on your cardiac medications before your next appointment, please call your pharmacy*   Follow-Up: At [facility], you and your health needs are our priority.  As part of our continuing mission to provide you with exceptional heart care, our providers are all part of one team.  This team includes your primary Cardiologist (physician) and Advanced Practice Providers or APPs (Physician Assistants and Nurse Practitioners) who all work together to provide you with the care you need, when you need it.  Your next appointment:   1 year(s)  Provider:   Gaylyn Keas, MD   Other Instructions If you have any questions or concerns regarding your c-pap, bi-pap or sleep accessories, please contact Brandie Rorie at [phone].

## 2024-03-15 NOTE — Addendum Note (Signed)
 Addended by: Claire Crick on: 03/15/2024 05:52 PM   Modules accepted: Orders

## 2024-03-15 NOTE — Progress Notes (Signed)
 Cardiology Office Note    Date:  03/15/2024   ID:  Amy Barnes, DOB 1951-01-03, MRN [id number]  PCP:  Claire Crick, MD  Cardiologist:  Magnus Schuller, MD (sleep); Dr. Lavonne Prairie    History of Present Illness:  Amy Barnes is a 73 y.o. female  who has a history of obesity with a BMI >60, hypertension, COPD, remote sleep apnea, chronic back pain/ infection post surgery, and LBBB.  She was admitted to the emergency room in February 2021 with nausea and vomiting.  Her troponin was elevated.  Her D-dimer was elevated.  She had a CT scan that was negative for pulmonary embolism but demonstrated coronary calcification and congestive heart failure.  An echo Doppler study in February 2021 showed an EF of 35 to 40% with apical wall motion abnormality.  Definitive catheterization showed normal coronary arteries.  She was treated as a nonischemic cardiomyopathy and therapy was initiated with Entresto , furosemide  and carvedilol .   Due to concerns for progressive significant sleep apnea, she was referred for diagnostic polysomnogram which was performed on January 23, 2020.  This demonstrated moderate overall sleep apnea with an AHI of 20.6/h; however, events were very severe during REM sleep with an AHI of 59.3/h.  She had severe oxygen  desaturation to a nadir of 67% and time spent less than 89% was 293 minutes.   She ultimately was referred for a in lab CPAP titration study.  This was done on April 15, 2020.  CPAP was titrated up to 18 cm and due to continued events  was transitioned to BiPAP and she required pressure titration up to 24/20.   She recently was hospitalized with COVID-19 pneumonia from August 5 through May 08, 2020 treated with remdesevir, IV steroids, oxygen  therapy and had low oxygen  saturations.  She had been unvaccinated.  Shortly after going home, she required rehospitalization from August 14 through August 24 due to worsening shortness of breath and respiratory failure secondary to  COVID-19 pneumonitis.   Following her hospitalization she had periods of continued nausea and vomiting.  She was recently started on BiPAP therapy but initially felt the pressures were too high.  I evaluated her in a telemedicine visit in September 2021.  The week prior to that evaluation she had  contacted our office and I had reduced her pressures to an EPAP minimum of 15 with an IPAP maximum of 25.  She has been using a full facemask which was recently changed from the one that she had gotten from the lab which was too small.  Over the last several days she has used BiPAP with her new settings and new mask and has noted significant benefit.  A download over the past several days has shown 100% use with average usage at 8 hours and 31 minutes.  AHI is 0.1 with a 95th percentile pressure at 19.9/15.8 and a maximum average pressure of 20.6/16.6.    During that evaluation I had a lengthy discussion with her regarding her sleep apnea and the requirement of her high pressures.  I discussed the effects of untreated sleep apnea on her cardiovascular health.  I raised concern that if she continued to have issues with hypoxemia she may very well with a component of obesity/hypoventilation syndrome.  She was on supplemental oxygen .  I recommended a 70-month follow-up evaluation  in person for further assessment.  Since her initial evaluation she admits to improved usage to her BiPAP therapy.  I obtained a download from October 17  through August 13, 2020.  Compliance was excellent and she was averaging 7 hours and 55 minutes of CPAP use per night.  Her minimum EPAP pressure was set at 15 with a maximum IPAP pressure of 25.  95th percentile pressure was 18.7/15.7 with a maximum average pressure at 20.8/16.8.  Typically she goes to bed at 10 PM and wakes up around 8 AM.  She has a full facemask F 20 and there is no significant leak.  An Epworth Sleepiness Scale score was calculated in the office today this is worse at 11.   She presents for an in person evaluation.  Past Medical History:  Diagnosis Date   Allergic rhinitis    Ambulates with cane    straight   Anemia    Anxiety    Asthma    seasonal   Bipolar affective disorder (HCC)    takes Synthroid  meds for Bipolar   CAD (coronary artery disease) 07/2016   by CT scan   Carpal tunnel syndrome    had surgery but occasional still has some issues per patient   Cataract    Centrilobular emphysema (HCC) 07/2016   by CT scan - pt not aware of this   Depression with anxiety    Diabetes mellitus    type 2 - no meds diet controlled   GERD (gastroesophageal reflux disease)    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 - 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  PO   Hyperlipidemia    Hypertension    Hypothyroidism    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 01/16/2018   prevnar 13 - large localized reaction 12/2017   OSA (obstructive sleep apnea)    no longer using cpap, uses a bed that raises and lowers hob   Osteoarthritis    Osteopenia 08/11/2019   DEXA 07/2019 - T -1.1 R femur (osteopenia)   Pneumonia due to COVID-19 virus 05/03/2020   Restless legs    Septic arthritis (HCC) 10/11/2012   Shingles 06/30/2016   Thoracic aortic atherosclerosis (HCC) 11/207   by CT    Past Surgical History:  Procedure Laterality Date   BREAST CYST ASPIRATION     CARPAL TUNNEL RELEASE Right 05/15/2011   CARPAL TUNNEL RELEASE Left 12/24/2017   Procedure: LEFT CARPAL TUNNEL RELEASE;  Surgeon: Brunilda Capra, MD;  Location: [facility];  Service: Orthopedics;  Laterality: Left;   CERVICAL FUSION  2012   C2/3/4   COLONOSCOPY WITH PROPOFOL  N/A 12/31/2015   diverticulosis, int hem, o/w normal rpt 10 yrs (Rein)   ESOPHAGOGASTRODUODENOSCOPY (EGD) WITH PROPOFOL  N/A 11/12/2019   Procedure: ESOPHAGOGASTRODUODENOSCOPY (EGD) WITH PROPOFOL ;  Surgeon: Albertina Hugger, MD;  Location: [facility];  Service: Gastroenterology;  Laterality: N/A;   EYE SURGERY Bilateral    cataract  surgery with lens implant   FOOT SURGERY  1982   bone spur   I & D EXTREMITY  09/06/2012   Arlette Lagos, MD; Right;  I&D of right thigh   I & D EXTREMITY Left 07/2013   wound vac - daily doxycycline  indefinitely   KNEE ARTHROSCOPY  09/01/2012   Christie Cox, MD;  Right   LEFT HEART CATH AND CORONARY ANGIOGRAPHY N/A 11/10/2019   Procedure: LEFT HEART CATH AND CORONARY ANGIOGRAPHY;  Surgeon: Millicent Ally, MD;  Location: [facility];  Service: Cardiovascular;  Laterality: N/A;   LUMBAR FUSION  01/08/2012   L3-4   LUMBAR FUSION  02/2019   unexpectedly discovered MRSA infection - pus Larrie Po)  LUMBAR LAMINECTOMY/DECOMPRESSION MICRODISCECTOMY N/A 11/13/2016   LAMINOTOMY/LAMINECTOMY LUMBAR FOUR LUMBAR FIVE  WITH RESECTION OF SYNOVIAL CYST;  Surgeon: Garry Kansas, MD   NECK SURGERY     Herniated disk C2,3,4   NOSE SURGERY     PARTIAL HYSTERECTOMY  1984   for mennorhagia, ovaries remain   REVISION TOTAL HIP ARTHROPLASTY Left 08/28/2011   TMJ ARTHROPLASTY  1982   TOTAL HIP ARTHROPLASTY Left 2002   TRIGGER FINGER RELEASE Right 05/15/2011   long finger    Current Medications: Outpatient Medications Prior to Visit  Medication Sig Dispense Refill   albuterol  (PROVENTIL  HFA;VENTOLIN  HFA) 108 (90 Base) MCG/ACT inhaler Inhale 2 puffs into the lungs every 6 (six) hours as needed for wheezing or shortness of breath. 1 Inhaler 6   albuterol  (PROVENTIL ) (2.5 MG/3ML) 0.083% nebulizer solution Take 3 mLs (2.5 mg total) by nebulization every 6 (six) hours as needed for wheezing or shortness of breath. 75 mL 12   aspirin  81 MG EC tablet Take 81 mg by mouth at bedtime.     budesonide -formoterol  (SYMBICORT ) 160-4.5 MCG/ACT inhaler Inhale 2 puffs into the lungs 2 (two) times daily.     carvedilol  (COREG ) 6.25 MG tablet TAKE 1 TABLET TWICE DAILY WITH MEALS (PLEASE KEEP UPCOMING APPT FOR FURTHER REFILLS) 180 tablet 3   Cholecalciferol  (VITAMIN D ) 50 MCG (2000 UT) CAPS Take 1 capsule (2,000 Units  total) by mouth daily. 30 capsule    cycloSPORINE (RESTASIS) 0.05 % ophthalmic emulsion Place 1 drop into both eyes 2 (two) times daily.     doxycycline  (VIBRA -TABS) 100 MG tablet TAKE 1 TABLET TWICE DAILY 60 tablet 1   ferrous sulfate  325 (65 FE) MG EC tablet Take 1 tablet (325 mg total) by mouth every Monday, Wednesday, and Friday.     fluconazole  (DIFLUCAN ) 150 MG tablet Take 1 tablet (150 mg total) by mouth daily. 3 tablet 0   furosemide  (LASIX ) 20 MG tablet Take 1 tablet (20 mg total) by mouth 2 (two) times daily. 180 tablet 1   glimepiride  (AMARYL ) 1 MG tablet Take 1 tablet (1 mg total) by mouth daily with breakfast. 90 tablet 4   lamoTRIgine  (LAMICTAL ) 200 MG tablet Take 400 mg by mouth daily.     levothyroxine  (SYNTHROID ) 150 MCG tablet Take 1 tablet (150 mcg total) by mouth at bedtime. 90 tablet 4   montelukast  (SINGULAIR ) 10 MG tablet Take 1 tablet (10 mg total) by mouth at bedtime. 90 tablet 4   Multiple Vitamin (MULTIVITAMIN WITH MINERALS) TABS tablet Take 2 tablets by mouth daily.      nystatin  cream (MYCOSTATIN ) Apply 1 Application topically 2 (two) times daily. 30 g 0   pantoprazole  (PROTONIX ) 40 MG tablet Take 1 tablet (40 mg total) by mouth 2 (two) times daily. 180 tablet 4   potassium chloride  (KLOR-CON ) 10 MEQ tablet Take 1 tablet (10 mEq total) by mouth daily. With lasix . May take an additional 10 meq when taking 60 meq of lasix  30 tablet 6   prochlorperazine  (COMPAZINE ) 10 MG tablet Take 1 tablet (10 mg total) by mouth every 6 (six) hours as needed for nausea or vomiting. 30 tablet 1   PROZAC  20 MG capsule Take 60 mg by mouth daily.     simvastatin  (ZOCOR ) 40 MG tablet Take 1 tablet (40 mg total) by mouth every evening. 90 tablet 4   dicyclomine  (BENTYL ) 20 MG tablet Take 1 tablet (20 mg total) by mouth in the morning and at bedtime. (Patient taking differently: Take 20  mg by mouth in the morning and at bedtime. As needed) 60 tablet 2   gabapentin  (NEURONTIN ) 100 MG capsule  Take 1 capsule (100 mg total) by mouth at bedtime. 90 capsule 4   ipratropium-albuterol  (DUONEB) 0.5-2.5 (3) MG/3ML SOLN Take 3 mLs by nebulization every 4 (four) hours as needed. 90 mL 0   OZEMPIC , 0.25 OR 0.5 MG/DOSE, 2 MG/3ML SOPN Inject 0.5 mg into the skin every 7 (seven) days. 9 mL 3   predniSONE  (DELTASONE ) 20 MG tablet Take two tablets daily for 3 days followed by one tablet daily for 4 days 10 tablet 0   No facility-administered medications prior to visit.     Allergies:   Toviaz  [fesoterodine ], Cephalexin, Hydrocodone , Entresto  [sacubitril -valsartan ], Macrodantin  [nitrofurantoin ], Metformin  and related, Tolterodine  tartrate, Risperidone and related, Seroquel [quetiapine fumarate], and Sulfa  antibiotics   Social History   Socioeconomic History   Marital status: Married    Spouse name: Not on file   Number of children: 3   Years of education: Not on file   Highest education level: Not on file  Occupational History   Occupation: Unemployed    Employer: UNEMPLOYED  Tobacco Use   Smoking status: Former    Current packs/day: 0.00    Average packs/day: 1.5 packs/day for 40.0 years (60.0 ttl pk-yrs)    Types: Cigarettes    Start date: 04/29/1969    Quit date: 04/29/2009    Years since quitting: 14.8   Smokeless tobacco: Never  Vaping Use   Vaping status: Never Used  Substance and Sexual Activity   Alcohol use: Yes    Alcohol/week: 2.0 standard drinks of alcohol    Types: 2 Shots of liquor per week    Comment: 2 shots/weekly   Drug use: No   Sexual activity: Not Currently    Comment: Hysterectomy  Other Topics Concern   Not on file  Social History Narrative   Married   3 children   Social Drivers of Health   Financial Resource Strain: Low Risk  (12/11/2020)   Overall Financial Resource Strain (CARDIA)    Difficulty of Paying Living Expenses: Not hard at all  Food Insecurity: No Food Insecurity (12/11/2020)   Hunger Vital Sign    Worried About Running Out of Food in the  Last Year: Never true    Ran Out of Food in the Last Year: Never true  Transportation Needs: No Transportation Needs (01/20/2020)   PRAPARE - Administrator, Civil Service (Medical): No    Lack of Transportation (Non-Medical): No  Physical Activity: Inactive (12/11/2020)   Exercise Vital Sign    Days of Exercise per Week: 0 days    Minutes of Exercise per Session: 0 min  Stress: Stress Concern Present (01/20/2020)   [employer] of Occupational Health - Occupational Stress Questionnaire    Feeling of Stress : To some extent  Social Connections: Socially Integrated (12/11/2020)   Social Connection and Isolation Panel    Frequency of Communication with Friends and Family: More than three times a week    Frequency of Social Gatherings with Friends and Family: More than three times a week    Attends Religious Services: More than 4 times per year    Active Member of [employer] or Organizations: Yes    Attends Banker Meetings: Not on file    Marital Status: Married     Family History:  The patient's family history includes Alcohol abuse in her father; Aneurysm (age of onset: 56)  in her father; CAD in an other family member; Cancer in her brother and father; Diabetes in her brother and sister.   ROS General: Negative; No fevers, chills, or night sweats;  HEENT: Negative; No changes in vision or hearing, sinus congestion, difficulty swallowing Pulmonary: Negative; No cough, wheezing, shortness of breath, hemoptysis Cardiovascular: Negative; No chest pain, presyncope, syncope, palpitations GI: Negative; No nausea, vomiting, diarrhea, or abdominal pain GU: Negative; No dysuria, hematuria, or difficulty voiding Musculoskeletal: Negative; no myalgias, joint pain, or weakness Hematologic/Oncology: Negative; no easy bruising, bleeding Endocrine: Negative; no heat/cold intolerance; no diabetes Neuro: Negative; no changes in balance, headaches Skin: Negative; No rashes or  skin lesions Psychiatric: Negative; No behavioral problems, depression Sleep: Positive for obstructive sleep apnea on BiPAP, no, bruxism, restless legs, hypnogognic hallucinations, no cataplexy Other comprehensive 14 point system review is negative.   PHYSICAL EXAM:   VS:  BP 114/74   Pulse 60   Ht 5' 4 (1.626 m)   Wt (!) 375 lb (170.1 kg)   LMP  (LMP Unknown)   SpO2 94%   BMI 64.37 kg/m     Repeat blood pressure by me was 118/70  Wt Readings from Last 3 Encounters:  03/15/24 (!) 375 lb (170.1 kg)  04/27/23 (!) 359 lb 12.8 oz (163.2 kg)  04/10/23 (!) 355 lb 2 oz (161.1 kg)    General: Alert, oriented, no distress.  Super morbid obesity with a BMI of 63.7 Skin: normal turgor, no rashes, warm and dry HEENT: Normocephalic, atraumatic. Pupils equal round and reactive to light; sclera anicteric; extraocular muscles intact;  Nose without nasal septal hypertrophy Mouth/Parynx benign; Mallinpatti scale 3/4 Neck: Thick neck no JVD, no carotid bruits; normal carotid upstroke Lungs: clear to ausculatation and percussion; no wheezing or rales Chest wall: without tenderness to palpitation Heart: PMI not displaced, RRR, s1 s2 normal, 1/6 systolic murmur, no diastolic murmur, no rubs, gallops, thrills, or heaves Abdomen: Central adiposity; soft, nontender; no hepatosplenomehaly, BS+; abdominal aorta nontender and not dilated by palpation. Back: no CVA tenderness Pulses 2+ Musculoskeletal: full range of motion, normal strength, no joint deformities Extremities: no clubbing cyanosis or edema, Homan's sign negative  Neurologic: grossly nonfocal; Cranial nerves grossly wnl Psychologic: Normal mood and affect   Studies/Labs Reviewed:   EKG Interpretation Date/Time:  Tuesday March 15 2024 11:53:07 EDT Ventricular Rate:  60 PR Interval:  188 QRS Duration:  144 QT Interval:  518 QTC Calculation: 518 R Axis:   -44  Text Interpretation: Normal sinus rhythm Left axis deviation Left bundle  branch block When compared with ECG of 27-Apr-2023 15:50, No significant change was found Confirmed by Magnus Schuller ([id number]) on 03/15/2024 12:37:59 PM    August 14, 2020 ECG (independently read by me): NSR at 68, LVH with QRS widening with repolarization, QTc 504  Recent Labs:    Latest Ref Rng & Units 04/06/2023    7:46 AM 11/06/2022    2:38 AM 09/19/2022    7:57 AM  BMP  Glucose 70 - 99 mg/dL 191  478  295   BUN 6 - 23 mg/dL 17  17  16    Creatinine 0.40 - 1.20 mg/dL 6.21  3.08  6.57   BUN/Creat Ratio 6 - 22 (calc)  SEE NOTE:    Sodium 135 - 145 mEq/L 141  141  139   Potassium 3.5 - 5.1 mEq/L 4.3  4.4  4.1   Chloride 96 - 112 mEq/L 101  101  99   CO2 19 -  32 mEq/L 33  28  29   Calcium  8.4 - 10.5 mg/dL 9.5  9.9  9.6         Latest Ref Rng & Units 04/06/2023    7:46 AM 12/17/2021    8:58 AM 02/01/2021    8:15 AM  Hepatic Function  Total Protein 6.0 - 8.3 g/dL 6.3  6.6  6.2   Albumin  3.5 - 5.2 g/dL 3.9  4.5  4.0   AST 0 - 37 U/L 14  16  18    ALT 0 - 35 U/L 16  22  22    Alk Phosphatase 39 - 117 U/L 61  70  73   Total Bilirubin 0.2 - 1.2 mg/dL 0.5  0.6  0.4        Latest Ref Rng & Units 04/06/2023    7:46 AM 11/06/2022    2:38 AM 09/19/2022    7:57 AM  CBC  WBC 4.0 - 10.5 K/uL 6.3  6.8  6.4   Hemoglobin 12.0 - 15.0 g/dL 65.7  84.6  96.2   Hematocrit 36.0 - 46.0 % 36.5  38.4  38.3   Platelets 150.0 - 400.0 K/uL 224.0  267  246.0    Lab Results  Component Value Date   MCV 88.2 04/06/2023   MCV 88.7 11/06/2022   MCV 88.4 09/19/2022   Lab Results  Component Value Date   TSH 2.09 04/06/2023   Lab Results  Component Value Date   HGBA1C 6.6 (H) 04/06/2023     BNP    Component Value Date/Time   BNP 139.5 (H) 05/16/2020 0715    ProBNP    Component Value Date/Time   PROBNP 95.0 03/12/2022 0741     Lipid Panel     Component Value Date/Time   CHOL 125 04/06/2023 0746   CHOL 133 06/18/2015 0000   TRIG 86.0 04/06/2023 0746   TRIG 62 06/18/2015 0000   HDL 48.60  04/06/2023 0746   CHOLHDL 3 04/06/2023 0746   VLDL 17.2 04/06/2023 0746   LDLCALC 59 04/06/2023 0746   LDLCALC 57 06/18/2015 0000   LDLDIRECT 48.0 03/13/2016 1025     RADIOLOGY: No results found.   Additional studies/ records that were reviewed today include:     Patient Name: Lamira, Borin Date: 04/15/2020 Gender: Female D.O.B: 05-10-1951 Age (years): 81 Referring Provider: Friddie Jetty NP Height (inches): 64 Interpreting Physician: Magnus Schuller MD, ABSM Weight (lbs): 375 RPSGT: Godwin Lat BMI: 64 MRN: [id number] Neck Size: 17.00   CLINICAL INFORMATION The patient is referred for a BiPAP titration to treat sleep apnea.   Date of NPSG: 01/23/2020:  AHI 20.6/h; RDI 22.1/h; REM AHI 59.3/h; supine AHI 20.6/h; O2 nadir 67%.   SLEEP STUDY TECHNIQUE As per the AASM Manual for the Scoring of Sleep and Associated Events v2.3 (April 2016) with a hypopnea requiring 4% desaturations.   The channels recorded and monitored were frontal, central and occipital EEG, electrooculogram (EOG), submentalis EMG (chin), nasal and oral airflow, thoracic and abdominal wall motion, anterior tibialis EMG, snore microphone, electrocardiogram, and pulse oximetry. Bilevel positive airway pressure (BPAP) was initiated at the beginning of the study and titrated to treat sleep-disordered breathing.   MEDICATIONS albuterol  (PROVENTIL  HFA;VENTOLIN  HFA) 108 (90 Base) MCG/ACT inhaler aspirin  81 MG EC tablet budesonide -formoterol  (SYMBICORT ) 160-4.5 MCG/ACT inhaler carvedilol  (COREG ) 6.25 MG tablet(Expired) celecoxib  (CELEBREX ) 200 MG capsule dicyclomine  (BENTYL ) 20 MG tablet docusate sodium  (COLACE) 100 MG capsule doxycycline  (VIBRA -TABS) 100 MG tablet fluconazole  (DIFLUCAN ) 200 MG tablet FLUoxetine  (  PROZAC ) 40 MG capsule furosemide  (LASIX ) 20 MG tablet(Expired) lamoTRIgine  (LAMICTAL ) 200 MG tablet levothyroxine  (SYNTHROID ) 150 MCG tablet losartan  (COZAAR ) 50 MG tablet(Expired) metFORMIN   (GLUCOPHAGE ) 500 MG tablet montelukast  (SINGULAIR ) 10 MG tablet Multiple Vitamin (MULTIVITAMIN WITH MINERALS) TABS tablet ondansetron  (ZOFRAN  ODT) 8 MG disintegrating tablet Oxcarbazepine  (TRILEPTAL ) 300 MG tablet oxybutynin  (DITROPAN -XL) 10 MG 24 hr tablet pantoprazole  (PROTONIX ) 40 MG tablet polyethylene glycol (MIRALAX  / GLYCOLAX ) 17 g packet prochlorperazine  (COMPAZINE ) 10 MG tablet promethazine  (PHENERGAN ) 25 MG suppository simvastatin  (ZOCOR ) 40 MG tablet sucralfate  (CARAFATE ) 1 GM/10ML suspension zaleplon (SONATA) 10 MG capsule Medications self-administered by patient taken the night of the study : N/A   RESPIRATORY PARAMETERS Optimal IPAP Pressure (cm): 24        AHI at Optimal Pressure (/hr) 0.8 Optimal EPAP Pressure (cm):            20           Overall Minimal O2 (%):         82.0     Minimal O2 at Optimal Pressure (%): 91.0   SLEEP ARCHITECTURE Start Time:      10:21:20 PM    Stop Time:       4:39:25 AM      Total Time (min):         378.1   Total Sleep Time (min):      340 Sleep Latency (min):   13.9     Sleep Efficiency (%):   89.9%  REM Latency (min):    99.0     WASO (min):  24.2 Stage N1 (%): 2.5%    Stage N2 (%): 46.2%  Stage N3 (%): 0.0%    Stage R (%):   51.3 Supine (%):     100.00 Arousal Index (/hr):     3.0          CARDIAC DATA The 2 lead EKG demonstrated sinus rhythm. The mean heart rate was 79.6 beats per minute. Other EKG findings include: None.   LEG MOVEMENT DATA The total Periodic Limb Movements of Sleep (PLMS) were 0. The PLMS index was 0.0. A PLMS index of <15 is considered normal in adults.   IMPRESSIONS - CPAP was initiated at 5 cm and was titrated to 18 cm with transition to BiPAP titrated to optimal BiPAP pressure at 24/20 cm of water. - Central sleep apnea was not noted during this titration (CAI = 0.0/h). - Moderete oxygen  desaturations to a nadir of 82.0% at 16 cm. - The patient snored with soft snoring volume. - No cardiac abnormalities  were observed during this study. - Clinically significant periodic limb movements were not noted during this study. Arousals associated with PLMs were rare.   DIAGNOSIS - Obstructive Sleep Apnea (G47.33)   RECOMMENDATIONS - Recommend an initial trial of BiPAP therapy at 24/20 cm H2O with heated humidification.  A Small size Fisher[employer]Paykel Full Face Mask Simplus mask was used for the titration. - Effort should be made to optimize nasal and oropharyngeal patency. - Avoid alcohol, sedatives and other CNS depressants that may worsen sleep apnea and disrupt normal sleep architecture. - Sleep hygiene should be reviewed to assess factors that may improve sleep quality. - Weight management and regular exercise should be initiated or continued. - Recommend a download in 30 days and sleep clinic evaluation after 4 weeks of therapy.    ASSESSMENT:    1. Essential hypertension     PLAN:  Ms. Evalin Shawhan is a 73 year old female who is followed  by Dr. Lavonne Prairie for her cardiology care.  She has a history of a nonischemic cardiomyopathy with initial echo Doppler study in February 2021 showing an EF of 35 to 40% with apical wall motion abnormality.  Catheterization revealed normal coronary arteries.  She received therapy with Entresto , furosemide  carvedilol .  Subsequently LV function evaluation in May showed normalization of LV function with EF at 60 to 65%.  She is morbidly obese with a BMI of 63.7.  She was found to have moderate overall sleep apnea with an AHI of 20.6/h but events were very severe during REM sleep with an AHI of 59.3/h and she had significant oxygen  desaturation to a nadir of 67% and time spent less than 89% was 293 minutes.  She has now on BiPAP therapy.  Initially she did not tolerate the high pressure requirements leading to pressure reduction.  Over the past several months she has done very well and she is meeting compliance standards with usage at least 90% with average usage at  essentially 8 hours per night.  Her 95th percentile pressure is 19.7/15.7 with maximum average pressure 20.8/68.8.  She believes she is sleeping well.  There are still has some very mild residual daytime sleepiness with Epworth scale today calculated at 11.  I had discussed with her the effects of sleep apnea on cardiovascular health and she feels much better since initiating treatment.  She has a ResMed air fit F 20 mask and there is no significant leak.  She continues to be on carvedilol  6.25 mg twice a day, furosemide  40 mg daily, and is no longer on Entresto .  Her blood pressure today is well controlled.  Despite getting Covid, she has not been vaccinated.  She will follow up with Dr. Lavonne Prairie for cardiology care.  I will see her in 1 year for follow-up sleep evaluation sooner as needed.   Medication Adjustments/Labs and Tests Ordered: Current medicines are reviewed at length with the patient today.  Concerns regarding medicines are outlined above.  Medication changes, Labs and Tests ordered today are listed in the Patient Instructions below. There are no Patient Instructions on file for this visit.   Signed, Magnus Schuller, MD  03/15/2024 12:13 PM    [facility] [address], [address], [place], [place]  [address] Phone: [phone]

## 2024-03-20 ENCOUNTER — Encounter: Payer: Self-pay | Admitting: Cardiovascular Disease

## 2024-03-20 NOTE — Addendum Note (Signed)
 Addended by: Xcaret Morad A on: 03/20/2024 01:55 PM   Modules accepted: Level of Service

## 2024-03-27 ENCOUNTER — Other Ambulatory Visit: Payer: Self-pay | Admitting: Internal Medicine

## 2024-04-06 ENCOUNTER — Encounter: Payer: Self-pay | Admitting: Family Medicine

## 2024-04-06 DIAGNOSIS — J9611 Chronic respiratory failure with hypoxia: Secondary | ICD-10-CM

## 2024-04-06 DIAGNOSIS — J441 Chronic obstructive pulmonary disease with (acute) exacerbation: Secondary | ICD-10-CM

## 2024-04-06 DIAGNOSIS — I5020 Unspecified systolic (congestive) heart failure: Secondary | ICD-10-CM

## 2024-04-06 DIAGNOSIS — E662 Morbid (severe) obesity with alveolar hypoventilation: Secondary | ICD-10-CM

## 2024-04-08 ENCOUNTER — Other Ambulatory Visit: Payer: Medicare Other

## 2024-04-08 ENCOUNTER — Other Ambulatory Visit (INDEPENDENT_AMBULATORY_CARE_PROVIDER_SITE_OTHER)

## 2024-04-08 ENCOUNTER — Ambulatory Visit (INDEPENDENT_AMBULATORY_CARE_PROVIDER_SITE_OTHER)

## 2024-04-08 VITALS — BP 116/72 | Ht 63.0 in | Wt 375.0 lb

## 2024-04-08 DIAGNOSIS — F3170 Bipolar disorder, currently in remission, most recent episode unspecified: Secondary | ICD-10-CM

## 2024-04-08 DIAGNOSIS — E785 Hyperlipidemia, unspecified: Secondary | ICD-10-CM

## 2024-04-08 DIAGNOSIS — D649 Anemia, unspecified: Secondary | ICD-10-CM | POA: Diagnosis not present

## 2024-04-08 DIAGNOSIS — Z0000 Encounter for general adult medical examination without abnormal findings: Secondary | ICD-10-CM

## 2024-04-08 DIAGNOSIS — E559 Vitamin D deficiency, unspecified: Secondary | ICD-10-CM | POA: Diagnosis not present

## 2024-04-08 DIAGNOSIS — E039 Hypothyroidism, unspecified: Secondary | ICD-10-CM | POA: Diagnosis not present

## 2024-04-08 DIAGNOSIS — E1142 Type 2 diabetes mellitus with diabetic polyneuropathy: Secondary | ICD-10-CM

## 2024-04-08 DIAGNOSIS — T8450XS Infection and inflammatory reaction due to unspecified internal joint prosthesis, sequela: Secondary | ICD-10-CM

## 2024-04-08 DIAGNOSIS — E1169 Type 2 diabetes mellitus with other specified complication: Secondary | ICD-10-CM

## 2024-04-08 DIAGNOSIS — T847XXS Infection and inflammatory reaction due to other internal orthopedic prosthetic devices, implants and grafts, sequela: Secondary | ICD-10-CM | POA: Diagnosis not present

## 2024-04-08 LAB — FERRITIN: Ferritin: 115.9 ng/mL (ref 10.0–291.0)

## 2024-04-08 LAB — CBC WITH DIFFERENTIAL/PLATELET
Basophils Absolute: 0 K/uL (ref 0.0–0.1)
Basophils Relative: 0.6 % (ref 0.0–3.0)
Eosinophils Absolute: 0.1 K/uL (ref 0.0–0.7)
Eosinophils Relative: 2.4 % (ref 0.0–5.0)
HCT: 37.8 % (ref 36.0–46.0)
Hemoglobin: 12.3 g/dL (ref 12.0–15.0)
Lymphocytes Relative: 22.3 % (ref 12.0–46.0)
Lymphs Abs: 1.2 K/uL (ref 0.7–4.0)
MCHC: 32.5 g/dL (ref 30.0–36.0)
MCV: 86.5 fl (ref 78.0–100.0)
Monocytes Absolute: 0.6 K/uL (ref 0.1–1.0)
Monocytes Relative: 10.9 % (ref 3.0–12.0)
Neutro Abs: 3.5 K/uL (ref 1.4–7.7)
Neutrophils Relative %: 63.8 % (ref 43.0–77.0)
Platelets: 217 K/uL (ref 150.0–400.0)
RBC: 4.37 Mil/uL (ref 3.87–5.11)
RDW: 15.4 % (ref 11.5–15.5)
WBC: 5.5 K/uL (ref 4.0–10.5)

## 2024-04-08 LAB — IBC PANEL
Iron: 70 ug/dL (ref 42–145)
Saturation Ratios: 21.3 % (ref 20.0–50.0)
TIBC: 329 ug/dL (ref 250.0–450.0)
Transferrin: 235 mg/dL (ref 212.0–360.0)

## 2024-04-08 LAB — SEDIMENTATION RATE: Sed Rate: 26 mm/h (ref 0–30)

## 2024-04-08 LAB — COMPREHENSIVE METABOLIC PANEL WITH GFR
ALT: 18 U/L (ref 0–35)
AST: 18 U/L (ref 0–37)
Albumin: 4.3 g/dL (ref 3.5–5.2)
Alkaline Phosphatase: 71 U/L (ref 39–117)
BUN: 17 mg/dL (ref 6–23)
CO2: 34 meq/L — ABNORMAL HIGH (ref 19–32)
Calcium: 9.1 mg/dL (ref 8.4–10.5)
Chloride: 102 meq/L (ref 96–112)
Creatinine, Ser: 0.61 mg/dL (ref 0.40–1.20)
GFR: 89.68 mL/min (ref >60.00)
Glucose, Bld: 215 mg/dL — ABNORMAL HIGH (ref 70–99)
Potassium: 4.4 meq/L (ref 3.5–5.1)
Sodium: 140 meq/L (ref 135–145)
Total Bilirubin: 0.6 mg/dL (ref 0.2–1.2)
Total Protein: 6.5 g/dL (ref 6.0–8.3)

## 2024-04-08 LAB — VITAMIN D 25 HYDROXY (VIT D DEFICIENCY, FRACTURES): VITD: 45.76 ng/mL (ref 30.00–100.00)

## 2024-04-08 LAB — VITAMIN B12: Vitamin B-12: 443 pg/mL (ref 211–911)

## 2024-04-08 LAB — LIPID PANEL
Cholesterol: 120 mg/dL (ref 0–200)
HDL: 49.5 mg/dL (ref >39.00)
LDL Cholesterol: 52 mg/dL (ref 0–99)
NonHDL: 70.95
Total CHOL/HDL Ratio: 2
Triglycerides: 94 mg/dL (ref 0.0–149.0)
VLDL: 18.8 mg/dL (ref 0.0–40.0)

## 2024-04-08 LAB — TSH: TSH: 2.4 u[IU]/mL (ref 0.35–5.50)

## 2024-04-08 LAB — C-REACTIVE PROTEIN: CRP: 1.8 mg/dL (ref 0.5–20.0)

## 2024-04-08 LAB — MICROALBUMIN / CREATININE URINE RATIO
Creatinine,U: 87.1 mg/dL
Microalb Creat Ratio: 32.9 mg/g — ABNORMAL HIGH (ref 0.0–30.0)
Microalb, Ur: 2.9 mg/dL — ABNORMAL HIGH (ref 0.0–1.9)

## 2024-04-08 LAB — HEMOGLOBIN A1C: Hgb A1c MFr Bld: 8 % — ABNORMAL HIGH (ref 4.6–6.5)

## 2024-04-08 NOTE — Progress Notes (Signed)
 Subjective:   Amy Barnes is a 73 y.o. who presents for a Medicare Wellness preventive visit.  As a reminder, Annual Wellness Visits don't include a physical exam, and some assessments may be limited, especially if this visit is performed virtually. We may recommend an in-person follow-up visit with your provider if needed.  Visit Complete: In person  Persons Participating in Visit: Patient assisted by [name] on the phone and granddaughter brings into appt via w/c.  AWV Questionnaire: No: Patient Medicare AWV questionnaire was not completed prior to this visit.  Cardiac Risk Factors include: advanced age (>85men, >72 women);diabetes mellitus;dyslipidemia;hypertension;sedentary lifestyle;obesity (BMI >30kg/m2)     Objective:    Today's Vitals   04/08/24 1101 04/08/24 1102  Weight: (!) 375 lb (170.1 kg)   Height: 5' 3 (1.6 m)   PainSc:  4    Body mass index is 66.43 kg/m.     04/08/2024   11:20 AM 03/15/2021    4:45 PM 11/12/2020   12:42 PM 05/12/2020    8:47 PM 05/03/2020    3:19 PM 04/15/2020    8:24 PM 01/23/2020    8:42 PM  Advanced Directives  Does Patient Have a Medical Advance Directive? Yes No Yes Yes Yes Yes No  Type of Estate agent of [place];Living will  Living will Living will Living will Living will   Does patient want to make changes to medical advance directive?   No - Patient declined No - Patient declined No - Patient declined No - Patient declined No - Patient declined  Copy of Healthcare Power of Attorney in Chart? Yes - validated most recent copy scanned in chart (See row information)        Would patient like information on creating a medical advance directive?  No - Patient declined  No - Patient declined No - Patient declined  No - Patient declined    Current Medications (verified) Outpatient Encounter Medications as of 04/08/2024  Medication Sig   albuterol  (PROVENTIL  HFA;VENTOLIN  HFA) 108 (90 Base) MCG/ACT inhaler Inhale  2 puffs into the lungs every 6 (six) hours as needed for wheezing or shortness of breath.   albuterol  (PROVENTIL ) (2.5 MG/3ML) 0.083% nebulizer solution Take 3 mLs (2.5 mg total) by nebulization every 6 (six) hours as needed for wheezing or shortness of breath.   aspirin  81 MG EC tablet Take 81 mg by mouth at bedtime.   budesonide -formoterol  (SYMBICORT ) 160-4.5 MCG/ACT inhaler Inhale 2 puffs into the lungs 2 (two) times daily.   carvedilol  (COREG ) 6.25 MG tablet TAKE 1 TABLET TWICE DAILY WITH MEALS (PLEASE KEEP UPCOMING APPT FOR FURTHER REFILLS)   Cholecalciferol  (VITAMIN D ) 50 MCG (2000 UT) CAPS Take 1 capsule (2,000 Units total) by mouth daily.   cycloSPORINE (RESTASIS) 0.05 % ophthalmic emulsion Place 1 drop into both eyes 2 (two) times daily.   doxycycline  (VIBRA -TABS) 100 MG tablet TAKE 1 TABLET TWICE DAILY   ferrous sulfate  325 (65 FE) MG EC tablet Take 1 tablet (325 mg total) by mouth every Monday, Wednesday, and Friday.   fluconazole  (DIFLUCAN ) 150 MG tablet Take 1 tablet (150 mg total) by mouth daily.   furosemide  (LASIX ) 20 MG tablet Take 1 tablet (20 mg total) by mouth 2 (two) times daily.   glimepiride  (AMARYL ) 1 MG tablet Take 1 tablet (1 mg total) by mouth daily with breakfast.   lamoTRIgine  (LAMICTAL ) 200 MG tablet Take 400 mg by mouth daily.   levothyroxine  (SYNTHROID ) 150 MCG tablet Take 1 tablet (  150 mcg total) by mouth at bedtime.   montelukast  (SINGULAIR ) 10 MG tablet Take 1 tablet (10 mg total) by mouth at bedtime.   Multiple Vitamin (MULTIVITAMIN WITH MINERALS) TABS tablet Take 2 tablets by mouth daily.    nystatin  cream (MYCOSTATIN ) Apply 1 Application topically 2 (two) times daily.   pantoprazole  (PROTONIX ) 40 MG tablet Take 1 tablet (40 mg total) by mouth 2 (two) times daily.   potassium chloride  (KLOR-CON ) 10 MEQ tablet Take 1 tablet (10 mEq total) by mouth daily. With lasix . May take an additional 10 meq when taking 60 meq of lasix    PROZAC  20 MG capsule Take 60 mg by  mouth daily.   simvastatin  (ZOCOR ) 40 MG tablet Take 1 tablet (40 mg total) by mouth every evening.   prochlorperazine  (COMPAZINE ) 10 MG tablet Take 1 tablet (10 mg total) by mouth every 6 (six) hours as needed for nausea or vomiting. (Patient not taking: Reported on 04/08/2024)   No facility-administered encounter medications on file as of 04/08/2024.    Allergies (verified) Toviaz  [fesoterodine ], Cephalexin, Hydrocodone , Entresto  [sacubitril -valsartan ], Macrodantin  [nitrofurantoin ], Metformin  and related, Tolterodine  tartrate, Risperidone and paliperidone, Seroquel [quetiapine fumarate], and Sulfa  antibiotics   History: Past Medical History:  Diagnosis Date   Allergic rhinitis    Ambulates with cane    straight   Anemia    Anxiety    Asthma    seasonal   Bipolar affective disorder (HCC)    takes Synthroid  meds for Bipolar   CAD (coronary artery disease) 07/2016   by CT scan   Carpal tunnel syndrome    had surgery but occasional still has some issues per patient   Cataract    Centrilobular emphysema (HCC) 07/2016   by CT scan - pt not aware of this   Depression with anxiety    Diabetes mellitus    type 2 - no meds diet controlled   GERD (gastroesophageal reflux disease)    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 - 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  PO   Hyperlipidemia    Hypertension    Hypothyroidism    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 01/16/2018   prevnar 13 - large localized reaction 12/2017   OSA (obstructive sleep apnea)    no longer using cpap, uses a bed that raises and lowers hob   Osteoarthritis    Osteopenia 08/11/2019   DEXA 07/2019 - T -1.1 R femur (osteopenia)   Pneumonia due to COVID-19 virus 05/03/2020   Restless legs    Septic arthritis (HCC) 10/11/2012   Shingles 06/30/2016   Thoracic aortic atherosclerosis (HCC) 11/207   by CT   Past Surgical History:  Procedure Laterality Date   BREAST CYST ASPIRATION     CARPAL TUNNEL RELEASE Right 05/15/2011    CARPAL TUNNEL RELEASE Left 12/24/2017   Procedure: LEFT CARPAL TUNNEL RELEASE;  Surgeon: Murrell Drivers, MD;  Location: [facility];  Service: Orthopedics;  Laterality: Left;   CERVICAL FUSION  2012   C2/3/4   COLONOSCOPY WITH PROPOFOL  N/A 12/31/2015   diverticulosis, int hem, o/w normal rpt 10 yrs (Rein)   ESOPHAGOGASTRODUODENOSCOPY (EGD) WITH PROPOFOL  N/A 11/12/2019   Procedure: ESOPHAGOGASTRODUODENOSCOPY (EGD) WITH PROPOFOL ;  Surgeon: Legrand Victory LITTIE DOUGLAS, MD;  Location[facility];  Service: Gastroenterology;  Laterality: N/A;   EYE SURGERY Bilateral    cataract surgery with lens implant   FOOT SURGERY  1982   bone spur   I & D EXTREMITY  09/06/2012   Ozell VEAR Bruch,  MD; Right;  I&D of right thigh   I & D EXTREMITY Left 07/2013   wound vac - daily doxycycline  indefinitely   KNEE ARTHROSCOPY  09/01/2012   Marcey Raman, MD;  Right   LEFT HEART CATH AND CORONARY ANGIOGRAPHY N/A 11/10/2019   Procedure: LEFT HEART CATH AND CORONARY ANGIOGRAPHY;  Surgeon: Burnard Debby LABOR, MD;  Location: [facility];  Service: Cardiovascular;  Laterality: N/A;   LUMBAR FUSION  01/08/2012   L3-4   LUMBAR FUSION  02/2019   unexpectedly discovered MRSA infection - pus Gavin)    LUMBAR LAMINECTOMY/DECOMPRESSION MICRODISCECTOMY N/A 11/13/2016   LAMINOTOMY/LAMINECTOMY LUMBAR FOUR LUMBAR FIVE  WITH RESECTION OF SYNOVIAL CYST;  Surgeon: Reyes Budge, MD   NECK SURGERY     Herniated disk C2,3,4   NOSE SURGERY     PARTIAL HYSTERECTOMY  1984   for mennorhagia, ovaries remain   REVISION TOTAL HIP ARTHROPLASTY Left 08/28/2011   TMJ ARTHROPLASTY  1982   TOTAL HIP ARTHROPLASTY Left 2002   TRIGGER FINGER RELEASE Right 05/15/2011   long finger   Family History  Problem Relation Age of Onset   Aneurysm Father 4       brain   Alcohol abuse Father    Cancer Father        possibly   CAD Other        several siblings   Cancer Brother        prostate   Diabetes Brother    Diabetes Sister     Anesthesia problems Neg Hx    Hypotension Neg Hx    Malignant hyperthermia Neg Hx    Pseudochol deficiency Neg Hx    Breast cancer Neg Hx    Social History   Socioeconomic History   Marital status: Married    Spouse name: Not on file   Number of children: 3   Years of education: Not on file   Highest education level: Not on file  Occupational History   Occupation: Unemployed    Employer: UNEMPLOYED  Tobacco Use   Smoking status: Former    Current packs/day: 0.00    Average packs/day: 1.5 packs/day for 40.0 years (60.0 ttl pk-yrs)    Types: Cigarettes    Start date: 04/29/1969    Quit date: 04/29/2009    Years since quitting: 14.9   Smokeless tobacco: Never  Vaping Use   Vaping status: Never Used  Substance and Sexual Activity   Alcohol use: Yes    Alcohol/week: 2.0 standard drinks of alcohol    Types: 2 Shots of liquor per week    Comment: 2 shots/weekly   Drug use: No   Sexual activity: Not Currently    Comment: Hysterectomy  Other Topics Concern   Not on file  Social History Narrative   Married   3 children   Social Drivers of Health   Financial Resource Strain: Low Risk  (04/08/2024)   Overall Financial Resource Strain (CARDIA)    Difficulty of Paying Living Expenses: Not hard at all  Food Insecurity: No Food Insecurity (04/08/2024)   Hunger Vital Sign    Worried About Running Out of Food in the Last Year: Never true    Ran Out of Food in the Last Year: Never true  Transportation Needs: No Transportation Needs (04/08/2024)   PRAPARE - Administrator, Civil Service (Medical): No    Lack of Transportation (Non-Medical): No  Physical Activity: Inactive (04/08/2024)   Exercise Vital Sign  Days of Exercise per Week: 0 days    Minutes of Exercise per Session: 0 min  Stress: No Stress Concern Present (04/08/2024)   [employer] of Occupational Health - Occupational Stress Questionnaire    Feeling of Stress: Not at all  Social Connections: Socially  Integrated (04/08/2024)   Social Connection and Isolation Panel    Frequency of Communication with Friends and Family: More than three times a week    Frequency of Social Gatherings with Friends and Family: More than three times a week    Attends Religious Services: More than 4 times per year    Active Member of [employer] or Organizations: Yes    Attends Banker Meetings: Never    Marital Status: Married    Tobacco Counseling Counseling given: Not Answered  Clinical Intake:  Pre-visit preparation completed: Yes  Pain : 0-10 Pain Score: 4  Pain Type: Chronic pain Pain Location: Knee (both hips) Pain Descriptors / Indicators: Aching, Shooting, Spasm Pain Onset: More than a month ago Pain Frequency: Constant Pain Relieving Factors: pain meds, etoh, Effect of Pain on Daily Activities: limits activities  Pain Relieving Factors: pain meds, etoh,  BMI - recorded: 66.43 Nutritional Status: BMI > 30  Obese Nutritional Risks: Nausea/ vomitting/ diarrhea (n/v at night) Diabetes: Yes CBG done?: No Did pt. bring in CBG monitor from home?: No  Lab Results  Component Value Date   HGBA1C 6.6 (H) 04/06/2023   HGBA1C 6.8 (H) 09/19/2022   HGBA1C 7.4 (H) 03/12/2022     How often do you need to have someone help you when you read instructions, pamphlets, or other written materials from your doctor or pharmacy?: 1 - Never  Interpreter Needed?: No  Comments: lives with husband Information entered by :: B.Terrina Docter,LPN   Activities of Daily Living     04/08/2024   11:20 AM  In your present state of health, do you have any difficulty performing the following activities:  Hearing? 0  Vision? 1  Difficulty concentrating or making decisions? 1  Walking or climbing stairs? 1  Dressing or bathing? 1  Doing errands, shopping? 1  Preparing Food and eating ? Y  Using the Toilet? N  In the past six months, have you accidently leaked urine? Y  Do you have problems with loss of  bowel control? N  Managing your Medications? Y  Managing your Finances? N  Housekeeping or managing your Housekeeping? Y    Patient Care Team: Rilla Baller, MD as PCP - General (Family Medicine) Lavona Agent, MD as PCP - Cardiology (Cardiology) Portia Fireman, OD (Optometry)  I have updated your Care Teams any recent Medical Services you may have received from other providers in the past year.     Assessment:   This is a routine wellness examination for [place].  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 0 0 0 0 0  Number falls in past yr: 0   0 0  Injury with Fall? 0   0 0  Risk for fall due to : Impaired balance/gait;Impaired mobility;Orthopedic patient;Impaired vision    No Fall Risks  Follow up Education provided;Falls prevention discussed    Falls evaluation completed      Data saved with a previous flowsheet row definition    MEDICARE RISK AT HOME:  Medicare Risk at Home Any stairs in or around the home?: Yes (ramp) If so, are there any without handrails?: Yes Home free of loose throw rugs in walkways, pet beds, electrical cords, etc?: Yes Adequate lighting in your home to reduce risk of falls?: Yes Life alert?: Yes (watch) Use of a  cane, walker or w/c?: Yes Grab bars in the bathroom?: Yes Shower chair or bench in shower?: Yes Elevated toilet seat or a handicapped toilet?: Yes  TIMED UP AND GO:  Was the test performed?  No pt brought in via w/c  Cognitive Function: 6CIT completed    01/20/2020    9:17 AM 01/12/2019    6:47 PM 01/05/2018   10:45 AM  MMSE - Mini Mental State Exam  Not completed:  Unable to complete   Orientation to time 5  5  Orientation to Place 5  5  Registration 3  3  Attention/ Calculation 5  0  Recall 3  3  Language- name 2 objects   0  Language- repeat 1  1  Language- follow 3 step command   3  Language- read & follow direction   0  Write a sentence   0  Copy design   0  Total score   20        04/08/2024   11:23 AM  6CIT Screen  What Year? 0 points  What month? 0 points  What time? 0 points  Count back from 20 0 points  Months in reverse 0 points  Repeat phrase 2 points  Total Score 2 points    Immunizations Immunization History  Administered Date(s) Administered   Fluad Quad(high Dose 65+) 08/10/2020, 09/26/2022   Influenza Split 08/15/2011, 08/30/2012   Influenza Whole 07/24/2007, 07/19/2009, 06/25/2010   Influenza,inj,Quad PF,6+ Mos 07/08/2016, 06/25/2017, 07/12/2018   Influenza-Unspecified 05/05/2011, 06/20/2015   Pneumococcal Conjugate-13 01/12/2018   Pneumococcal Polysaccharide-23 08/30/2012, 01/27/2020   Td 06/30/2007   Zoster, Live 12/29/2013    Screening Tests Health Maintenance  Topic Date Due   COVID-19 Vaccine (1) Never done   Zoster Vaccines- Shingrix (1 of 2) 06/08/1970   MAMMOGRAM  08/09/2020   Diabetic kidney evaluation - Urine ACR  02/01/2022   Lung Cancer Screening  03/01/2023   HEMOGLOBIN A1C  10/07/2023   Diabetic kidney evaluation - eGFR measurement  04/05/2024   FOOT EXAM  04/09/2024   INFLUENZA VACCINE  04/29/2024   OPHTHALMOLOGY EXAM  02/18/2025   Medicare Annual Wellness (AWV)  04/08/2025   Colonoscopy  12/30/2025   Pneumococcal  Vaccine: 50+ Years  Completed   DEXA SCAN  Completed   Hepatitis C Screening  Completed   Hepatitis B Vaccines  Aged Out   HPV VACCINES  Aged Out   Meningococcal B Vaccine  Aged Out   DTaP/Tdap/Td  Discontinued    Health Maintenance  Health Maintenance Due  Topic Date Due   COVID-19 Vaccine (1) Never done   Zoster Vaccines- Shingrix (1 of 2) 06/08/1971   MAMMOGRAM  08/09/2020   Diabetic kidney evaluation - Urine ACR  02/01/2022   Lung Cancer Screening  03/01/2023   HEMOGLOBIN A1C  10/07/2023   Diabetic kidney evaluation - eGFR measurement  04/05/2024   Health Maintenance Items Addressed: Vaccinations: pt declines: Influenza vaccine: recommend every Fall Shingles vaccine: recommend Shingrix which is 2 doses 2-6 months apart and over 90% effective     Covid-19: recommend 2 doses one month apart with a booster 6 months later  Pt declines to schedule/do MMG  Additional Screening:  Vision Screening: Recommended annual ophthalmology exams for early detection of glaucoma and other disorders of the eye. Would you like a referral to an eye doctor? No    Dental Screening: Recommended annual dental exams for proper oral hygiene  Community Resource Referral / Chronic Care Management: CRR required this visit?  No   CCM required this visit?  No   Plan:    I have personally reviewed and noted the following in the patient's chart:   Medical and social history Use of alcohol, tobacco or illicit drugs  Current medications and supplements including opioid prescriptions. Patient is not currently taking opioid prescriptions. Functional ability and status Nutritional status Physical activity Advanced directives List of other physicians Hospitalizations, surgeries, and ER visits in previous 12 months Vitals Screenings to include cognitive, depression, and falls Referrals and appointments  In addition, I have reviewed and discussed with patient certain preventive protocols, quality  metrics, and best practice recommendations. A written personalized care plan for preventive services as well as general preventive health recommendations were provided to patient.   Erminio LITTIE Saris, LPN   [id number]/7974   After Visit Summary: (MyChart) Due to this being a telephonic visit, the after visit summary with patients personalized plan was offered to patient via MyChart   Notes: Please refer to Routing Comments.

## 2024-04-08 NOTE — Patient Instructions (Addendum)
 [name] , Thank you for taking time out of your busy schedule to complete your Annual Wellness Visit with me. I enjoyed our conversation and look forward to speaking with you again next year. I, as well as your care team,  appreciate your ongoing commitment to your health goals. Please review the following plan we discussed and let me know if I can assist you in the future. Your Game plan/ To Do List     Follow up Visits: Next Medicare AWV with our clinical staff: 04/11/25 @  10:50am in person Have you seen your provider in the last 6 months (3 months if uncontrolled diabetes)? No Next Office Visit with your provider: 04/15/24  Clinician Recommendations:  Aim for 30 minutes of exercise or brisk walking, 6-8 glasses of water, and 5 servings of fruits and vegetables each day.       This is a list of the screening recommended for you and due dates:  Health Maintenance  Topic Date Due   COVID-19 Vaccine (1) Never done   Zoster (Shingles) Vaccine (1 of 2) 06/08/1971   Mammogram  08/09/2020   Yearly kidney health urinalysis for diabetes  02/01/2022   Screening for Lung Cancer  03/01/2023   Hemoglobin A1C  10/07/2023   Yearly kidney function blood test for diabetes  04/05/2024   Complete foot exam   04/09/2024   Flu Shot  04/29/2024   Eye exam for diabetics  02/18/2025   Medicare Annual Wellness Visit  04/08/2025   Colon Cancer Screening  12/30/2025   Pneumococcal Vaccine for age over 47  Completed   DEXA scan (bone density measurement)  Completed   Hepatitis C Screening  Completed   Hepatitis B Vaccine  Aged Out   HPV Vaccine  Aged Out   Meningitis B Vaccine  Aged Out   DTaP/Tdap/Td vaccine  Discontinued    Advanced directives: (In Chart) A copy of your advanced directives are scanned into your chart should your provider ever need it. Advance Care Planning is important because it:  [x]  Makes sure you receive the medical care that is consistent with your values, goals, and  preferences  [x]  It provides guidance to your family and loved ones and reduces their decisional burden about whether or not they are making the right decisions based on your wishes.  Follow the link provided in your after visit summary or read over the paperwork we have mailed to you to help you started getting your Advance Directives in place. If you need assistance in completing these, please reach out to us  so that we can help you!

## 2024-04-09 ENCOUNTER — Ambulatory Visit: Payer: Self-pay | Admitting: Family Medicine

## 2024-04-13 ENCOUNTER — Encounter: Payer: Self-pay | Admitting: Internal Medicine

## 2024-04-13 ENCOUNTER — Telehealth: Admitting: Internal Medicine

## 2024-04-13 DIAGNOSIS — B3731 Acute candidiasis of vulva and vagina: Secondary | ICD-10-CM

## 2024-04-13 DIAGNOSIS — B9689 Other specified bacterial agents as the cause of diseases classified elsewhere: Secondary | ICD-10-CM | POA: Diagnosis not present

## 2024-04-13 DIAGNOSIS — T847XXD Infection and inflammatory reaction due to other internal orthopedic prosthetic devices, implants and grafts, subsequent encounter: Secondary | ICD-10-CM | POA: Diagnosis not present

## 2024-04-13 MED ORDER — DOXYCYCLINE HYCLATE 100 MG PO TABS: 100.0000 mg | ORAL_TABLET | Freq: Two times a day (BID) | ORAL | 3 refills | Status: DC

## 2024-04-13 MED ORDER — FLUCONAZOLE 150 MG PO TABS: 150.0000 mg | ORAL_TABLET | Freq: Every day | ORAL | 1 refills | Status: AC

## 2024-04-13 NOTE — Telephone Encounter (Signed)
 Order written for POC and placed in Lisa's box.

## 2024-04-13 NOTE — Progress Notes (Signed)
 Virtual Visit via Video Note  I connected with Amy Barnes on 04/13/24 at 11:15 AM EDT by a video enabled telemedicine application and verified that I am speaking with the correct person using two identifiers.  Location: Patient: at home Provider: in clinic   I discussed the limitations of evaluation and management by telemedicine and the availability of in person appointments. The patient expressed understanding and agreed to proceed.  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 Continues to tolerate taking abtx without difficulty. She has stable back pain not worsened. She reports that she had ground level fall yesterday but did not injure herself. Overall appears well. Reviewed lab results with her  Lab Results  Component Value Date   ESRSEDRATE 26 04/08/2024   Lab Results  Component Value Date   CRP 1.8 04/08/2024      Observations/Objective: Gen= a x o by 3 in nad Heent = PERLLA, eomi, no scleral icterus Skin = no visible rash  Assessment and Plan: Refill doxycycline  for chronic suppression of hw infection Refill fluconazole  as needed for vaginal candidiasis  Follow Up Instructions: Rtc in 6 months -video; repeat labs at that time   I discussed the assessment and treatment plan with the patient. The patient was provided an opportunity to ask questions and all were answered. The patient agreed with the plan and demonstrated an understanding of the instructions.   The patient was advised to call back or seek an in-person evaluation if the symptoms worsen or if the condition fails to improve as anticipated.    Amy Bologna, MD

## 2024-04-13 NOTE — Telephone Encounter (Signed)
 Faxed order to [facility] at [phone].

## 2024-04-15 ENCOUNTER — Ambulatory Visit: Payer: Medicare Other | Admitting: Family Medicine

## 2024-04-15 VITALS — BP 116/58 | HR 69 | Temp 97.7°F | Ht 63.0 in | Wt 344.5 lb

## 2024-04-15 DIAGNOSIS — F3181 Bipolar II disorder: Secondary | ICD-10-CM

## 2024-04-15 DIAGNOSIS — I5020 Unspecified systolic (congestive) heart failure: Secondary | ICD-10-CM | POA: Diagnosis not present

## 2024-04-15 DIAGNOSIS — Z6841 Body Mass Index (BMI) 40.0 and over, adult: Secondary | ICD-10-CM

## 2024-04-15 DIAGNOSIS — I10 Essential (primary) hypertension: Secondary | ICD-10-CM

## 2024-04-15 DIAGNOSIS — J9611 Chronic respiratory failure with hypoxia: Secondary | ICD-10-CM

## 2024-04-15 DIAGNOSIS — E039 Hypothyroidism, unspecified: Secondary | ICD-10-CM | POA: Diagnosis not present

## 2024-04-15 DIAGNOSIS — N3946 Mixed incontinence: Secondary | ICD-10-CM

## 2024-04-15 DIAGNOSIS — Z79899 Other long term (current) drug therapy: Secondary | ICD-10-CM | POA: Diagnosis not present

## 2024-04-15 DIAGNOSIS — M85851 Other specified disorders of bone density and structure, right thigh: Secondary | ICD-10-CM

## 2024-04-15 DIAGNOSIS — E1142 Type 2 diabetes mellitus with diabetic polyneuropathy: Secondary | ICD-10-CM

## 2024-04-15 DIAGNOSIS — Z7984 Long term (current) use of oral hypoglycemic drugs: Secondary | ICD-10-CM

## 2024-04-15 DIAGNOSIS — E559 Vitamin D deficiency, unspecified: Secondary | ICD-10-CM

## 2024-04-15 DIAGNOSIS — Z66 Do not resuscitate: Secondary | ICD-10-CM

## 2024-04-15 DIAGNOSIS — K219 Gastro-esophageal reflux disease without esophagitis: Secondary | ICD-10-CM | POA: Diagnosis not present

## 2024-04-15 DIAGNOSIS — E1169 Type 2 diabetes mellitus with other specified complication: Secondary | ICD-10-CM

## 2024-04-15 DIAGNOSIS — Z789 Other specified health status: Secondary | ICD-10-CM

## 2024-04-15 DIAGNOSIS — M961 Postlaminectomy syndrome, not elsewhere classified: Secondary | ICD-10-CM

## 2024-04-15 DIAGNOSIS — I2721 Secondary pulmonary arterial hypertension: Secondary | ICD-10-CM | POA: Diagnosis not present

## 2024-04-15 DIAGNOSIS — J984 Other disorders of lung: Secondary | ICD-10-CM

## 2024-04-15 DIAGNOSIS — E662 Morbid (severe) obesity with alveolar hypoventilation: Secondary | ICD-10-CM | POA: Diagnosis not present

## 2024-04-15 DIAGNOSIS — W19XXXA Unspecified fall, initial encounter: Secondary | ICD-10-CM

## 2024-04-15 DIAGNOSIS — J4520 Mild intermittent asthma, uncomplicated: Secondary | ICD-10-CM

## 2024-04-15 DIAGNOSIS — Z7189 Other specified counseling: Secondary | ICD-10-CM

## 2024-04-15 DIAGNOSIS — T847XXS Infection and inflammatory reaction due to other internal orthopedic prosthetic devices, implants and grafts, sequela: Secondary | ICD-10-CM

## 2024-04-15 DIAGNOSIS — G4733 Obstructive sleep apnea (adult) (pediatric): Secondary | ICD-10-CM

## 2024-04-15 DIAGNOSIS — E785 Hyperlipidemia, unspecified: Secondary | ICD-10-CM

## 2024-04-15 MED ORDER — BUDESONIDE-FORMOTEROL FUMARATE 160-4.5 MCG/ACT IN AERO: 2.0000 | INHALATION_SPRAY | Freq: Two times a day (BID) | RESPIRATORY_TRACT | 11 refills | Status: DC

## 2024-04-15 MED ORDER — POTASSIUM CHLORIDE ER 10 MEQ PO TBCR: 10.0000 meq | EXTENDED_RELEASE_TABLET | Freq: Every day | ORAL | 3 refills | Status: DC

## 2024-04-15 MED ORDER — MONTELUKAST SODIUM 10 MG PO TABS: 10.0000 mg | ORAL_TABLET | Freq: Every day | ORAL | 4 refills | Status: AC

## 2024-04-15 MED ORDER — PANTOPRAZOLE SODIUM 40 MG PO TBEC: 40.0000 mg | DELAYED_RELEASE_TABLET | Freq: Two times a day (BID) | ORAL | 4 refills | Status: AC

## 2024-04-15 MED ORDER — ALBUTEROL SULFATE HFA 108 (90 BASE) MCG/ACT IN AERS: 2.0000 | INHALATION_SPRAY | Freq: Four times a day (QID) | RESPIRATORY_TRACT | 6 refills | Status: DC | PRN

## 2024-04-15 MED ORDER — SIMVASTATIN 40 MG PO TABS: 40.0000 mg | ORAL_TABLET | Freq: Every evening | ORAL | 4 refills | Status: AC

## 2024-04-15 MED ORDER — GLIMEPIRIDE 1 MG PO TABS: 1.0000 mg | ORAL_TABLET | Freq: Every day | ORAL | 4 refills | Status: AC

## 2024-04-15 MED ORDER — LEVOTHYROXINE SODIUM 150 MCG PO TABS: 150.0000 ug | ORAL_TABLET | Freq: Every day | ORAL | 4 refills | Status: AC

## 2024-04-15 NOTE — Progress Notes (Addendum)
 Ph: [phone] Fax: [phone]   Patient ID: Amy Barnes, female    DOB: 1951-01-11, 73 y.o.   MRN: [id number]  This visit was conducted in person.  BP (!) 116/58   Pulse 69   Temp 97.7 F (36.5 C) (Oral)   Ht 5' 3 (1.6 m)   Wt (!) 344 lb 8 oz (156.3 kg)   LMP  (LMP Unknown)   SpO2 97% Comment: 2 liters  BMI 61.03 kg/m   O2 sats drop to 85% off supplemental oxygen  Needs POC for mobility due to wheelchair use.   CC: AMW f/u visit  Subjective:   HPI: Amy Barnes is a 73 y.o. female presenting on 04/15/2024 for Annual Exam ([name] is here with patient/)   Saw health advisor last week for medicare wellness visit. Note reviewed.   No results found.  Flowsheet Row Office Visit from 04/15/2024 in [facility] [place]  PHQ-2 Total Score 0       04/15/2024    3:32 PM 04/13/2024   11:12 AM 04/08/2024   11:10 AM 06/17/2023    4:07 PM 04/10/2023    4:43 PM  Fall Risk   Falls in the past year? 1 1 0 0 0  Number falls in past yr: 0 0 0    Injury with Fall? 1 0 0    Risk for fall due to :   Impaired balance/gait;Impaired mobility;Orthopedic patient;Impaired vision    Follow up Falls evaluation completed  Education provided;Falls prevention discussed     Clemens Tuesday of this week - got up from chair in back deck and slipped - fell onto arm, R posterior head and back. EMS called and evaluated but did not go to ER.   H/o MRSA hardware infection followed by ID Stann) on doxycycline  100mg  daily with diflucan   for chronic infection suppression.    HFrEF sees cardiology (Hochrein) on lasix  40mg  BID PRN with potassium.    DM - continues ozempic  0.5mg  weekly and amaryl  1mg  daily.  Lab Results  Component Value Date   HGBA1C 8.0 (H) 04/08/2024   Bipolar II followed by Darice Molt psychiatry provider on lamictal  400mg  daily.    Asthma-COPD overlap with severe restriction and OSA/OHVS - saw pulm Dr Gladis 04/2022 who started symbicort . On albuterol   PRN, normally 1-2/wk.    Chronic respiratory failure - requests new Rx for oxygen  [employer]. 3-4 L supplemental O2 by [place], oxygen  concentrator and tanks.    Interested in evaluation for electric wheelchair. She has a scooter but is unable to get into the scooter due to worsening knee pain getting in so doesn't use this.   On oxycodone  5mg  old prescription. Usually manages with tylenol  arthritis strength 650mg . Rare use of oxycodone  about once a month. Hydrocodone  intolerance, tramadol  ineffective.    Preventative: Pt desires to discontinue preventative healthcare screenings due to comorbidities.  COLONOSCOPY WITH PROPOFOL  12/31/2015; diverticulosis, int hem, o/w normal rpt 10 yrs ([place])  Breast cancer screening -07/2019 BiRads1 - due, declines repeat Well woman exam - h/o partial hysterectomy for menorrhagia, ovaries remain.  DEXA 07/2019 - T -1.1 R femur (osteopenia). Discussed vit D and dietary calcium . Lung cancer screening - ex smoker, quit 2010. ~40 PY hx. previously underwent LRCT scans - last 08/2020. most recently HRCT of lungs done 02/2022  Flu shot - yearly  COVID vaccine - declined Td - 2008  Pneumovax 2013, prevnar-13 2019 (large local reaction after this). Pneumovax 12/2019. Zostavax -  2015  Shingrix - discussed Advanced directive - scanned 11/2022. [place]o [place] and [place] are [place]. Does not want life prolonging measures if terminal condition. Wants living will followed over HCPOA. Does not want CPR, intubation. Confirmed DNR - has form at home.  Seat belt use discussed Sunscreen use discussed. No changing moles on skin.  Ex smoker  Alcohol - infrequent shots of vodka  Dentist yearly  Eye exam yearly  Bowel - chronic constipation managed with MOM  Bladder - ongoing mixed incontinence - stress and urge - poor tolerance to antimuscarinics. Accidents upon first standing.  She is using 4-5 pull ups daily due to lasix  use as well.  Requests Rx for pull ups.     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 Interim medical history since our last visit reviewed. Allergies and medications reviewed and updated. Outpatient Medications Prior to Visit  Medication Sig Dispense Refill   albuterol  (PROVENTIL ) (2.5 MG/3ML) 0.083% nebulizer solution Take 3 mLs (2.5 mg total) by nebulization every 6 (six) hours as needed for wheezing or shortness of breath. 75 mL 12   aspirin  81 MG EC tablet Take 81 mg by mouth at bedtime.     carvedilol  (COREG ) 6.25 MG tablet TAKE 1 TABLET TWICE DAILY WITH MEALS (PLEASE KEEP UPCOMING APPT FOR FURTHER REFILLS) 180 tablet 3   Cholecalciferol  (VITAMIN D ) 50 MCG (2000 UT) CAPS Take 1 capsule (2,000 Units total) by mouth daily. 30 capsule    cycloSPORINE (RESTASIS) 0.05 % ophthalmic emulsion Place 1 drop into both eyes 2 (two) times daily.     doxycycline  (VIBRA -TABS) 100 MG tablet Take 1 tablet (100 mg total) by mouth 2 (two) times daily. 120 tablet 3   ferrous sulfate  325 (65 FE) MG EC tablet Take 1 tablet (325 mg total) by mouth every Monday, Wednesday, and Friday.     fluconazole  (DIFLUCAN ) 150 MG tablet Take 1 tablet (150 mg total) by mouth daily. 10 tablet 1   furosemide  (LASIX ) 20 MG tablet Take 1 tablet (20 mg total) by mouth 2 (two) times daily. 180 tablet 1   lamoTRIgine  (LAMICTAL ) 200 MG tablet Take 400 mg by mouth daily.     Multiple Vitamin (MULTIVITAMIN WITH MINERALS) TABS tablet Take 2 tablets by mouth daily.      nystatin  cream (MYCOSTATIN ) Apply 1 Application topically 2 (two) times daily. 30 g 0   PROZAC  20 MG capsule Take 60 mg by mouth daily.     albuterol  (PROVENTIL  HFA;VENTOLIN  HFA) 108 (90 Base) MCG/ACT inhaler Inhale 2 puffs into the lungs every 6 (six) hours as needed for wheezing or shortness of  breath. 1 Inhaler 6   budesonide -formoterol  (SYMBICORT ) 160-4.5 MCG/ACT inhaler Inhale 2 puffs into the lungs 2 (two) times daily.     glimepiride  (AMARYL ) 1 MG tablet Take 1 tablet (1 mg total) by mouth daily with breakfast. 90 tablet 4   levothyroxine  (SYNTHROID ) 150 MCG tablet Take 1 tablet (150 mcg total) by mouth at bedtime. 90 tablet 4   montelukast  (SINGULAIR ) 10 MG tablet Take 1 tablet (10 mg total) by mouth at bedtime. 90 tablet 4   pantoprazole  (PROTONIX ) 40 MG tablet Take 1 tablet (40 mg total) by mouth 2 (two) times daily. 180 tablet 4   potassium chloride  (KLOR-CON ) 10  MEQ tablet Take 1 tablet (10 mEq total) by mouth daily. With lasix . May take an additional 10 meq when taking 60 meq of lasix  30 tablet 6   prochlorperazine  (COMPAZINE ) 10 MG tablet Take 1 tablet (10 mg total) by mouth every 6 (six) hours as needed for nausea or vomiting. 30 tablet 1   simvastatin  (ZOCOR ) 40 MG tablet Take 1 tablet (40 mg total) by mouth every evening. 90 tablet 4   No facility-administered medications prior to visit.     Per HPI unless specifically indicated in ROS section below Review of Systems  Objective:  BP (!) 116/58   Pulse 69   Temp 97.7 F (36.5 C) (Oral)   Ht 5' 3 (1.6 m)   Wt (!) 344 lb 8 oz (156.3 kg)   LMP  (LMP Unknown)   SpO2 97% Comment: 2 liters  BMI 61.03 kg/m   Wt Readings from Last 3 Encounters:  04/15/24 (!) 344 lb 8 oz (156.3 kg)  04/08/24 (!) 375 lb (170.1 kg)  03/15/24 (!) 375 lb (170.1 kg)      Physical Exam Vitals and nursing note reviewed.  Constitutional:      Appearance: Normal appearance. She is not ill-appearing.     Comments:  Sitting in wheelchair Supplemental O2 in place  HENT:     Head: Normocephalic.      Comments: Tender to palpation R parieto-occipital region without skin disruption or obvious goose-egg     Right Ear: Tympanic membrane, ear canal and external ear normal. There is no impacted cerumen.     Left Ear: Tympanic membrane, ear  canal and external ear normal. There is no impacted cerumen.  Eyes:     General:        Right eye: No discharge.        Left eye: No discharge.     Extraocular Movements: Extraocular movements intact.     Conjunctiva/sclera: Conjunctivae normal.     Pupils: Pupils are equal, round, and reactive to light.  Neck:     Thyroid : No thyroid  mass or thyromegaly.  Cardiovascular:     Rate and Rhythm: Normal rate and regular rhythm.     Pulses: Normal pulses.     Heart sounds: Normal heart sounds. No murmur heard. Pulmonary:     Effort: Pulmonary effort is normal. No respiratory distress.     Breath sounds: Normal breath sounds. No wheezing, rhonchi or rales.  Abdominal:     General: Bowel sounds are normal. There is no distension.     Palpations: Abdomen is soft. There is no mass.     Tenderness: There is no abdominal tenderness. There is no guarding or rebound.     Hernia: No hernia is present.  Musculoskeletal:        General: Tenderness present.     Cervical back: Normal range of motion and neck supple. No rigidity.     Right lower leg: No edema.     Left lower leg: No edema.     Comments:  Marked crepitus to R knee with ROM Diffusely tender to palpation of midline thoracic/lumbar spine, chronic   Lymphadenopathy:     Cervical: No cervical adenopathy.  Skin:    General: Skin is warm and dry.     Findings: Bruising (R forearm) present. No rash.  Neurological:     General: No focal deficit present.     Mental Status: She is alert. Mental status is at baseline.  Psychiatric:  Mood and Affect: Mood normal.        Behavior: Behavior normal.       Results for orders placed or performed in visit on 04/08/24  VITAMIN D  25 Hydroxy (Vit-D Deficiency, Fractures)   Collection Time: 04/08/24 10:10 AM  Result Value Ref Range   VITD 45.76 30.00 - 100.00 ng/mL  IBC panel   Collection Time: 04/08/24 10:10 AM  Result Value Ref Range   Iron 70 42 - 145 ug/dL   Transferrin 764.9  787.9 - 360.0 mg/dL   Saturation Ratios 78.6 20.0 - 50.0 %   TIBC 329.0 250.0 - 450.0 mcg/dL  Ferritin   Collection Time: 04/08/24 10:10 AM  Result Value Ref Range   Ferritin 115.9 10.0 - 291.0 ng/mL  Vitamin B12   Collection Time: 04/08/24 10:10 AM  Result Value Ref Range   Vitamin B-12 443 211 - 911 pg/mL  CBC with Differential/Platelet   Collection Time: 04/08/24 10:10 AM  Result Value Ref Range   WBC 5.5 4.0 - 10.5 K/uL   RBC 4.37 3.87 - 5.11 Mil/uL   Hemoglobin 12.3 12.0 - 15.0 g/dL   HCT 62.1 63.9 - 53.9 %   MCV 86.5 78.0 - 100.0 fl   MCHC 32.5 30.0 - 36.0 g/dL   RDW 84.5 88.4 - 84.4 %   Platelets 217.0 150.0 - 400.0 K/uL   Neutrophils Relative % 63.8 43.0 - 77.0 %   Lymphocytes Relative 22.3 12.0 - 46.0 %   Monocytes Relative 10.9 3.0 - 12.0 %   Eosinophils Relative 2.4 0.0 - 5.0 %   Basophils Relative 0.6 0.0 - 3.0 %   Neutro Abs 3.5 1.4 - 7.7 K/uL   Lymphs Abs 1.2 0.7 - 4.0 K/uL   Monocytes Absolute 0.6 0.1 - 1.0 K/uL   Eosinophils Absolute 0.1 0.0 - 0.7 K/uL   Basophils Absolute 0.0 0.0 - 0.1 K/uL  TSH   Collection Time: 04/08/24 10:10 AM  Result Value Ref Range   TSH 2.40 0.35 - 5.50 uIU/mL  Comprehensive metabolic panel with GFR   Collection Time: 04/08/24 10:10 AM  Result Value Ref Range   Sodium 140 135 - 145 mEq/L   Potassium 4.4 3.5 - 5.1 mEq/L   Chloride 102 96 - 112 mEq/L   CO2 34 (H) 19 - 32 mEq/L   Glucose, Bld 215 (H) 70 - 99 mg/dL   BUN 17 6 - 23 mg/dL   Creatinine, Ser 9.38 0.40 - 1.20 mg/dL   Total Bilirubin 0.6 0.2 - 1.2 mg/dL   Alkaline Phosphatase 71 39 - 117 U/L   AST 18 0 - 37 U/L   ALT 18 0 - 35 U/L   Total Protein 6.5 6.0 - 8.3 g/dL   Albumin  4.3 3.5 - 5.2 g/dL   GFR 10.31 >39.99 mL/min   Calcium  9.1 8.4 - 10.5 mg/dL  Lipid panel   Collection Time: 04/08/24 10:10 AM  Result Value Ref Range   Cholesterol 120 0 - 200 mg/dL   Triglycerides 05.9 0.0 - 149.0 mg/dL   HDL 50.49 >60.99 mg/dL   VLDL 81.1 0.0 - 59.9 mg/dL   LDL  Cholesterol 52 0 - 99 mg/dL   Total CHOL/HDL Ratio 2    NonHDL 70.95   Hemoglobin A1c   Collection Time: 04/08/24 10:10 AM  Result Value Ref Range   Hgb A1c MFr Bld 8.0 (H) 4.6 - 6.5 %  C-reactive protein   Collection Time: 04/08/24 10:10 AM  Result Value Ref Range  CRP 1.8 0.5 - 20.0 mg/dL  Sedimentation rate   Collection Time: 04/08/24 10:10 AM  Result Value Ref Range   Sed Rate 26 0 - 30 mm/hr  Microalbumin / creatinine urine ratio   Collection Time: 04/08/24 10:11 AM  Result Value Ref Range   Microalb, Ur 2.9 (H) 0.0 - 1.9 mg/dL   Creatinine,U 12.8 mg/dL   Microalb Creat Ratio 32.9 (H) 0.0 - 30.0 mg/g    Assessment & Plan:   Problem List Items Addressed This Visit     Advanced care planning/counseling discussion - Primary (Chronic)   Advanced directive - scanned 11/2022. Rollo [place] and [place] are [place]. Does not want life prolonging measures if terminal condition. Wants living will followed over HCPOA. Does not want CPR, intubation. Confirmed DNR - has form at home.       Impaired mobility and activities of daily living (Chronic)   She will return for mobility assessment for electrical wheelchair      Failed back surgical syndrome (Chronic)   DNR (do not resuscitate) (Chronic)   Again confirmed today. Has [facility] form       Hypothyroidism   Chronic, stable on current regimen for years.  She notes she's on levothyroxine  more for mood (h/o bipolar) than h/o hypothyroidism - advised discuss with psychiatry and see if they're willing to take over prescription if that's the case.       Relevant Medications   levothyroxine  (SYNTHROID ) 150 MCG tablet   Type 2 diabetes mellitus with peripheral neuropathy (HCC)   Chronic, deteriorated only on amaryl  1mg  daily.  Was on ozempic  0.5mg  weekly - however this became unaffordable ($900/mo) due to medicare drug coverage changes 2025. Will refer to pharmacy team to see if eligible for patient assistance  program.  In interim, encouraged low sugar low carb diabetic diet.  RTC 6 mo labs update A1c.       Relevant Medications   glimepiride  (AMARYL ) 1 MG tablet   simvastatin  (ZOCOR ) 40 MG tablet   Other Relevant Orders   AMB Referral VBCI Care Management   Hyperlipidemia associated with type 2 diabetes mellitus (HCC)   Chronic, stable on simvastatin  40mg  daily - continue. The ASCVD Risk score (Arnett DK, et al., 2019) failed to calculate for the following reasons:   Risk score cannot be calculated because patient has a medical history suggesting prior/existing ASCVD       Relevant Medications   glimepiride  (AMARYL ) 1 MG tablet   simvastatin  (ZOCOR ) 40 MG tablet   Bipolar II disorder (HCC)   Sees posychiatry Darice Molt on lamictal , prozac , levothyroxine       Essential hypertension   Chronic, stable on current regimen of carvedilol  and lasix  - continue      Relevant Medications   simvastatin  (ZOCOR ) 40 MG tablet   Asthma   Continue symbicort , singulair , PRN albuterol .  Has not recently seen pulmonology       Relevant Medications   albuterol  (VENTOLIN  HFA) 108 (90 Base) MCG/ACT inhaler   budesonide -formoterol  (SYMBICORT ) 160-4.5 MCG/ACT inhaler   montelukast  (SINGULAIR ) 10 MG tablet   GERD   Chronic, stable on twice daily PPI - continue.       Relevant Medications   pantoprazole  (PROTONIX ) 40 MG tablet   OSA treated with BiPAP   Continue autoBiPAP. Last saw pulm 2023.       Morbid obesity with BMI of 60.0-69.9, adult (HCC)   Ozempic  unaffordable.  Will check into PAP eligibility - pharmacy referral placed.  Relevant Medications   glimepiride  (AMARYL ) 1 MG tablet   PAH (pulmonary artery hypertension) (HCC)   Relevant Medications   simvastatin  (ZOCOR ) 40 MG tablet   Polypharmacy   Vitamin D  insufficiency   Levels stable on 2000 units daily - continue.       Mixed incontinence   Ongoing mixed incontinence, with component of neurogenic bladder from prior  lumbar surgeries.  Previously saw urology but was unable to tolerate antimuscarinics (led to hospitalization due to GI symptoms).  Could consider myrbetriq.  She currently regularly uses diapers - goes through 4-5/day. Asks about insurance coverage for this. They will check into insurance-preferred DME supplier and request through them for insurance coverage.      Wound infection complicating hardware (HCC)   Chronic, on doxycycline . Latest inflammatory markers normal  Appreciate ID care.  Update inflammatory markers in 76 months       Osteopenia   Declines further DEXA.  Continue vit D and dietary calcium  intake.       HFrEF (heart failure with reduced ejection fraction) (HCC)   Chronic, stable period on daily lasix  - continue cardiology f/u.       Relevant Medications   simvastatin  (ZOCOR ) 40 MG tablet   Chronic respiratory failure with hypoxia, on home O2 therapy (HCC)   Continue supplemental oxygen . O2 drops to 85% at rest off supplemental oxygen .  They ask about portable oxygen  concentrator use - we have sent request to [employer]      Obesity hypoventilation syndrome (HCC)   Relevant Medications   glimepiride  (AMARYL ) 1 MG tablet   Restrictive lung disease   Severe, saw pulm 2023.  Continue inhaler use.       Fall with injury   Suffered fall this week, fortunately no obvious major injury. Notes worse generalized pain since fall.         Meds ordered this encounter  Medications   albuterol  (VENTOLIN  HFA) 108 (90 Base) MCG/ACT inhaler    Sig: Inhale 2 puffs into the lungs every 6 (six) hours as needed for wheezing or shortness of breath.    Dispense:  1 each    Refill:  6   budesonide -formoterol  (SYMBICORT ) 160-4.5 MCG/ACT inhaler    Sig: Inhale 2 puffs into the lungs 2 (two) times daily.    Dispense:  10.2 each    Refill:  11   glimepiride  (AMARYL ) 1 MG tablet    Sig: Take 1 tablet (1 mg total) by mouth daily with breakfast.    Dispense:  90 tablet    Refill:   4   levothyroxine  (SYNTHROID ) 150 MCG tablet    Sig: Take 1 tablet (150 mcg total) by mouth at bedtime.    Dispense:  90 tablet    Refill:  4   montelukast  (SINGULAIR ) 10 MG tablet    Sig: Take 1 tablet (10 mg total) by mouth at bedtime.    Dispense:  90 tablet    Refill:  4   pantoprazole  (PROTONIX ) 40 MG tablet    Sig: Take 1 tablet (40 mg total) by mouth 2 (two) times daily.    Dispense:  180 tablet    Refill:  4   DISCONTD: potassium chloride  (KLOR-CON ) 10 MEQ tablet    Sig: Take 1 tablet (10 mEq total) by mouth daily. With lasix . May take an additional 10 meq when taking 60 meq of lasix     Dispense:  90 tablet    Refill:  3   simvastatin  (ZOCOR ) 40 MG  tablet    Sig: Take 1 tablet (40 mg total) by mouth every evening.    Dispense:  90 tablet    Refill:  4   potassium chloride  (KLOR-CON ) 10 MEQ tablet    Sig: Take 1 tablet (10 mEq total) by mouth daily. With lasix . May take an additional 10 meq when taking 60 meq of lasix     Dispense:  100 tablet    Refill:  3    Orders Placed This Encounter  Procedures   AMB Referral VBCI Care Management    Referral Priority:   Routine    Referral Type:   Consultation    Referral Reason:   Care Coordination    Number of Visits Requested:   1    Patient Instructions  Schedule lab visit only in 6 months and physical in 1 year  We have requested evaluation for portable oxygen  concentrator through [employer].  Check with insurance on preferred medical supplier for urinary incontinence supplies.  Iron levels were great this year.  Sugar was too high - work on diabetes and diet.  We will recheck A1c in 6 months as well.  Return in 1 year for next physical/wellness visit   Follow up plan: Return in about 1 year (around 04/15/2025) for annual exam, prior fasting for blood work, medicare wellness visit.  Anton Blas, MD

## 2024-04-15 NOTE — Patient Instructions (Addendum)
 Schedule lab visit only in 6 months and physical in 1 year  We have requested evaluation for portable oxygen  concentrator through [employer].  Check with insurance on preferred medical supplier for urinary incontinence supplies.  Iron levels were great this year.  Sugar was too high - work on diabetes and diet.  We will recheck A1c in 6 months as well.  Return in 1 year for next physical/wellness visit

## 2024-04-16 ENCOUNTER — Telehealth: Payer: Self-pay | Admitting: Family Medicine

## 2024-04-16 ENCOUNTER — Encounter: Payer: Self-pay | Admitting: Family Medicine

## 2024-04-16 DIAGNOSIS — W19XXXA Unspecified fall, initial encounter: Secondary | ICD-10-CM | POA: Insufficient documentation

## 2024-04-16 MED ORDER — POTASSIUM CHLORIDE ER 10 MEQ PO TBCR: 10.0000 meq | EXTENDED_RELEASE_TABLET | Freq: Every day | ORAL | 3 refills | Status: AC

## 2024-04-16 NOTE — Assessment & Plan Note (Signed)
 Severe, saw pulm 2023.  Continue inhaler use.

## 2024-04-16 NOTE — Assessment & Plan Note (Signed)
 Chronic, stable on simvastatin  40mg  daily - continue. The ASCVD Risk score (Arnett DK, et al., 2019) failed to calculate for the following reasons:   Risk score cannot be calculated because patient has a medical history suggesting prior/existing ASCVD

## 2024-04-16 NOTE — Assessment & Plan Note (Addendum)
 Sees posychiatry Darice Molt on lamictal , prozac , levothyroxine 

## 2024-04-16 NOTE — Assessment & Plan Note (Signed)
 Again confirmed today. Has [facility] form

## 2024-04-16 NOTE — Assessment & Plan Note (Signed)
 Ongoing mixed incontinence, with component of neurogenic bladder from prior lumbar surgeries.  Previously saw urology but was unable to tolerate antimuscarinics (led to hospitalization due to GI symptoms).  Could consider myrbetriq.  She currently regularly uses diapers - goes through 4-5/day. Asks about insurance coverage for this. They will check into insurance-preferred DME supplier and request through them for insurance coverage.

## 2024-04-16 NOTE — Assessment & Plan Note (Addendum)
 Chronic, stable on current regimen of carvedilol  and lasix  - continue

## 2024-04-16 NOTE — Telephone Encounter (Signed)
 Plz notify daughter I have placed referral to pharmacy team to see if she's eligible for any Ozempic  patient assistance program to help afford this medication.

## 2024-04-16 NOTE — Assessment & Plan Note (Addendum)
 Continue symbicort , singulair , PRN albuterol .  Has not recently seen pulmonology

## 2024-04-16 NOTE — Assessment & Plan Note (Addendum)
 Chronic, deteriorated only on amaryl  1mg  daily.  Was on ozempic  0.5mg  weekly - however this became unaffordable ($900/mo) due to medicare drug coverage changes 2025. Will refer to pharmacy team to see if eligible for patient assistance program.  In interim, encouraged low sugar low carb diabetic diet.  RTC 6 mo labs update A1c.

## 2024-04-16 NOTE — Assessment & Plan Note (Signed)
 Chronic, stable on twice daily PPI - continue.

## 2024-04-16 NOTE — Assessment & Plan Note (Signed)
 Levels stable on 2000 units daily - continue.

## 2024-04-16 NOTE — Assessment & Plan Note (Signed)
 Continue autoBiPAP. Last saw pulm 2023.

## 2024-04-16 NOTE — Assessment & Plan Note (Addendum)
 Declines further DEXA.  Continue vit D and dietary calcium  intake.

## 2024-04-16 NOTE — Assessment & Plan Note (Signed)
 Chronic, stable on current regimen for years.  She notes she's on levothyroxine  more for mood (h/o bipolar) than h/o hypothyroidism - advised discuss with psychiatry and see if they're willing to take over prescription if that's the case.

## 2024-04-16 NOTE — Assessment & Plan Note (Signed)
 Chronic, on doxycycline . Latest inflammatory markers normal  Appreciate ID care.  Update inflammatory markers in 76 months

## 2024-04-16 NOTE — Assessment & Plan Note (Signed)
 Continue supplemental oxygen . O2 drops to 85% at rest off supplemental oxygen .  They ask about portable oxygen  concentrator use - we have sent request to [facility]

## 2024-04-16 NOTE — Assessment & Plan Note (Signed)
 Chronic, stable period on daily lasix  - continue cardiology f/u.

## 2024-04-16 NOTE — Assessment & Plan Note (Signed)
 Advanced directive - scanned 11/2022. Rollo Norris [place] and [place] are [place]. Does not want life prolonging measures if terminal condition. Wants living will followed over HCPOA. Does not want CPR, intubation. Confirmed DNR - has form at home.

## 2024-04-16 NOTE — Assessment & Plan Note (Signed)
 She will return for mobility assessment for electrical wheelchair

## 2024-04-16 NOTE — Assessment & Plan Note (Signed)
 Ozempic  unaffordable.  Will check into PAP eligibility - pharmacy referral placed.

## 2024-04-16 NOTE — Assessment & Plan Note (Signed)
 Suffered fall this week, fortunately no obvious major injury. Notes worse generalized pain since fall.

## 2024-04-18 ENCOUNTER — Telehealth: Payer: Self-pay

## 2024-04-18 NOTE — Telephone Encounter (Signed)
 Lvm asking pt/pt's daughter, Amy Barnes (on dpr), to call back. Need to relay Dr Talmadge message.

## 2024-04-18 NOTE — Progress Notes (Signed)
 Care Guide Pharmacy Note  04/18/2024 Name: Amy Barnes MRN: [id number] DOB: 02-09-1951  Referred By: Rilla Baller, MD Reason for referral: Complex Care Management (Outreach to schedule with Pharm d )   Amy Barnes is a 73 y.o. year old female who is a primary care patient of Rilla Baller, MD.  Amy Barnes was referred to the pharmacist for assistance related to: DMII  An unsuccessful telephone outreach was attempted today to contact the patient who was referred to the pharmacy team for assistance with medication assistance. Additional attempts will be made to contact the patient.  Jeoffrey Buffalo , RMA     [facility]  [facility], [facility] Guide  Direct Dial: [phone]  Website: [email or web address]

## 2024-04-18 NOTE — Progress Notes (Signed)
 Care Guide Pharmacy Note  04/18/2024 Name: Amy Barnes MRN: [id number] DOB: 1951/04/15  Referred By: Rilla Baller, MD Reason for referral: Complex Care Management (Outreach to schedule with Pharm d )   Amy Barnes is a 73 y.o. year old female who is a primary care patient of Rilla Baller, MD.  Amy Barnes was referred to the pharmacist for assistance related to: DMII  Successful contact was made with the patient to discuss pharmacy services including being ready for the pharmacist to call at least 5 minutes before the scheduled appointment time and to have medication bottles and any blood pressure readings ready for review. The patient agreed to meet with the pharmacist via telephone visit on (date/time).04/21/2024  Jeoffrey Buffalo , RMA     [facility], [facility] Guide  Direct Dial: [phone]  Website: [email or web address]

## 2024-04-19 NOTE — Telephone Encounter (Signed)
 Called and talked with patient she has appointment with pharmacy later this week. They will review all options with them at that time.

## 2024-04-21 ENCOUNTER — Other Ambulatory Visit (INDEPENDENT_AMBULATORY_CARE_PROVIDER_SITE_OTHER): Admitting: Pharmacist

## 2024-04-21 DIAGNOSIS — J9611 Chronic respiratory failure with hypoxia: Secondary | ICD-10-CM

## 2024-04-21 DIAGNOSIS — E1142 Type 2 diabetes mellitus with diabetic polyneuropathy: Secondary | ICD-10-CM

## 2024-04-21 DIAGNOSIS — J984 Other disorders of lung: Secondary | ICD-10-CM

## 2024-04-21 NOTE — Progress Notes (Addendum)
   04/21/2024 Name: Amy Barnes MRN: [id number] DOB: March 15, 1951  Subjective  Chief Complaint  Patient presents with   Diabetes   Medication Access    Care Team: Primary Care Provider: Rilla Baller, MD  Reason for visit: ?  Amy Barnes is a 73 y.o. female who presents today for a telephone visit with the pharmacist due to medication access concerns regarding their Ozempic .   Medication Access: ?  Reports that all medications are not affordable.  Ozempic  covered by insurance, though copay is not affordable. Symbicort  also not affordable this year, copay >$400. Suspect drug deductible. Currently not using Symbicort  or albuterol .  On Ozempic  last year and tolerated well up to 0.5 mg dose though stopped at end of the year due to the donut hole.   Prescription drug coverage: YES Payor: MEDICARE / Plan: MEDICARE PART A AND B / Product Type: *No Product type* / .   Current Patient Assistance: None  Patient lives in a household of 2 with husband with an estimated income just over ~$50k  Medicare LIS Eligible: No   AZ&Me: 300% FPL LAMA/LABA/ICS    Breztri (glycopyrrolate /formoterol /budesonide ) SABA-ICS     Airspura (albuterol /budesonide )   Assessment and Plan:   1. Medication Access Diabetes Ozempic : Likely eligible. Previously tolerated 0.5 mg w no side effects and was able to achieve A1c <7%. Likely she will achieve goal A1c on low dose Ozempic , though would benefit from further titration if tolerated for additional benefits including CAD risk reduction, OSA, weight loss.  If any c/f side effects, can microdose to main Asthma/COPD Symbicort  (formoterol /mometasone  - SABA/ICS) not available through PAP given generic status. Newly prescribed for asthma/copd overlap syndrome. Not taking due to cost.  Reasonable alternative = Airspura PRN through [employer]Me (albuterol /ICS). Will discuss with PCP. Triple therapy also reasonable if maintenance for COPD deemed appropriate. COPD  Severity not clear.   Future Appointments  Date Time Provider Department Center  05/09/2024 10:00 AM Rilla Baller, MD [facility]  10/17/2024  8:30 AM [facility] LAB [facility]  04/11/2025 10:50 AM [facility] ANNUAL WELLNESS VISIT 1 [facility]  04/14/2025  8:30 AM [facility] LAB [facility]  04/21/2025  3:30 PM Rilla Baller, MD [facility]    [name], PharmD Clinical Pharmacist [facility] [phone]

## 2024-04-21 NOTE — Patient Instructions (Addendum)
 [name],   It was a pleasure to speak with you today! As we discussed:?  We have initiated your application for the following medication assistance program:   [employer] [facility]) Medication: Ozempic  [employer] [address], [place], [place] [address]  Phone: [phone] M-F 8am-8pm ET    Fax: [phone]   What to Expect: The program typically reviews applications within 3 business days but may require additional time during periods of higher volume, especially during January and February. Once reviewed, the program will send you and your doctor a letter informing you of your approval or denial. If approved, your letter will explain how you will receive your medicine  (this program ships medication to your doctors office here at [facility]. We will call you once your medicine has been delivered and is ready to be picked up).   For any questions after your application has been submitted, you may contact the program directly by phone. [phone] To Check your application status For Questions regarding your medication shipment or delivery details To see when your next medication refill will be shipped out To request medication refills    Symbicort  Inhaler / Options  I will also chat with Dr. KANDICE about the inhalers that you may be eligible to receive for free through a similar program. There is an inhaler with the same type of ingredients as Symbicort  that is a good alternative!  After chatting with Dr. KANDICE, I can mail this application to your home for you to review and sign. Then, your spouse can drop off at the front desk here and we will fax it in for review.     Please reach out if you have any questions/concerns.  You may respond directly to this message, or leave me a voicemail at [phone] and I will get back to you shortly.   We will plan to touch base in a few weeks once you are approved and your medicine shipment has started  processing.  We can review the medication again in more detail, including how the device is used/injected.   I set this check-in for about 2 weeks. This is flexible and can be moved so please let me know if this day is not ideal.   Future Appointments  Date Time Provider Department Center  05/05/2024 11:00 AM [facility] PHARMACIST [facility]  05/09/2024 10:00 AM Rilla Baller, MD [facility]  10/17/2024  8:30 AM [facility] LAB [facility]  04/11/2025 10:50 AM [facility] ANNUAL WELLNESS VISIT 1 [facility]  04/14/2025  8:30 AM [facility] LAB [facility]  04/21/2025  3:30 PM Rilla Baller, MD [facility]   [name], PharmD Clinical Pharmacist [facility] [place] Ph: [phone]

## 2024-04-26 ENCOUNTER — Other Ambulatory Visit: Payer: Self-pay | Admitting: Family Medicine

## 2024-04-26 DIAGNOSIS — K219 Gastro-esophageal reflux disease without esophagitis: Secondary | ICD-10-CM

## 2024-05-05 ENCOUNTER — Other Ambulatory Visit: Admitting: Pharmacist

## 2024-05-05 NOTE — Progress Notes (Deleted)
   05/05/2024 Name: Amy Barnes MRN: [id number] DOB: 1951-08-09  Subjective  No chief complaint on file.   Care Team: Primary Care Provider: Rilla Baller, MD  Reason for visit: ?  Amy Barnes is a 73 y.o. female who presents today for a telephone visit with the pharmacist due to medication access concerns regarding their Ozempic .   Medication Access: ?  Reports that all medications are not affordable.  Ozempic  covered by insurance, though copay is not affordable. Symbicort  also not affordable this year, copay >$400. Suspect drug deductible. Currently not using Symbicort  or albuterol .  On Ozempic  last year and tolerated well up to 0.5 mg dose though stopped at end of the year due to the donut hole.   Prescription drug coverage: YES Payor: MEDICARE / Plan: MEDICARE PART A AND B / Product Type: *No Product type* / .   Current Patient Assistance: None  Patient lives in a household of 2 with husband with an estimated income just over ~$50k  Medicare LIS Eligible: No   AZ&Me: 300% FPL LAMA/LABA/ICS    Breztri (glycopyrrolate /formoterol /budesonide ) SABA-ICS     Airspura (albuterol /budesonide )   Assessment and Plan:   1. Medication Access Diabetes Ozempic : Likely eligible. Previously tolerated 0.5 mg w no side effects and was able to achieve A1c <7%. Likely she will achieve goal A1c on low dose Ozempic , though would benefit from further titration if tolerated for additional benefits including CAD risk reduction, OSA, weight loss.  If any c/f side effects, can microdose to main Asthma/COPD Symbicort  (formoterol /mometasone  - SABA/ICS) not available through PAP given generic status. Newly prescribed for asthma/copd overlap syndrome. Not taking due to cost.  Reasonable alternative = Airspura PRN through [employer] (albuterol /ICS). Will discuss with PCP. Triple therapy also reasonable if maintenance for COPD deemed appropriate. COPD Severity not clear.   Future Appointments  Date  Time Provider Department Center  05/09/2024 10:00 AM Rilla Baller, MD [facility]  10/17/2024  8:30 AM [facility] LAB [facility]  04/11/2025 10:50 AM [facility] ANNUAL WELLNESS VISIT 1 [facility]  04/14/2025  8:30 AM [facility] LAB [facility]  04/21/2025  3:30 PM Rilla Baller, MD [facility]    [name], PharmD Clinical Pharmacist [facility] [phone]

## 2024-05-06 ENCOUNTER — Telehealth: Payer: Self-pay | Admitting: Family Medicine

## 2024-05-06 NOTE — Telephone Encounter (Signed)
 Copied from CRM [id number]. Topic: Medical Record Request - Provider/Facility Request >> May 06, 2024  3:14 PM [place] J wrote: Reason for CRM: Steen calling from [employer] in regards to an application that was received on 08/05. The application received is discontinued so at this time a current version is needing to be submitted to the patient and the provider. She did state that due to HIPAA it will not include the patient's name but it will include a number that will help identify the patient.  Callback Number: [phone] Identity Number: #[id number]

## 2024-05-09 ENCOUNTER — Encounter: Payer: Self-pay | Admitting: Family Medicine

## 2024-05-09 ENCOUNTER — Ambulatory Visit (INDEPENDENT_AMBULATORY_CARE_PROVIDER_SITE_OTHER): Admitting: Family Medicine

## 2024-05-09 VITALS — BP 112/66 | HR 62 | Temp 98.0°F | Ht 63.0 in | Wt 344.0 lb

## 2024-05-09 DIAGNOSIS — J9611 Chronic respiratory failure with hypoxia: Secondary | ICD-10-CM

## 2024-05-09 DIAGNOSIS — M150 Primary generalized (osteo)arthritis: Secondary | ICD-10-CM

## 2024-05-09 DIAGNOSIS — Z7689 Persons encountering health services in other specified circumstances: Secondary | ICD-10-CM

## 2024-05-09 DIAGNOSIS — E662 Morbid (severe) obesity with alveolar hypoventilation: Secondary | ICD-10-CM

## 2024-05-09 DIAGNOSIS — Z7409 Other reduced mobility: Secondary | ICD-10-CM | POA: Diagnosis not present

## 2024-05-09 DIAGNOSIS — E1142 Type 2 diabetes mellitus with diabetic polyneuropathy: Secondary | ICD-10-CM | POA: Diagnosis not present

## 2024-05-09 DIAGNOSIS — M160 Bilateral primary osteoarthritis of hip: Secondary | ICD-10-CM

## 2024-05-09 DIAGNOSIS — Z789 Other specified health status: Secondary | ICD-10-CM

## 2024-05-09 DIAGNOSIS — Z6841 Body Mass Index (BMI) 40.0 and over, adult: Secondary | ICD-10-CM | POA: Diagnosis not present

## 2024-05-09 DIAGNOSIS — M961 Postlaminectomy syndrome, not elsewhere classified: Secondary | ICD-10-CM

## 2024-05-09 DIAGNOSIS — R0609 Other forms of dyspnea: Secondary | ICD-10-CM | POA: Diagnosis not present

## 2024-05-09 DIAGNOSIS — J4489 Other specified chronic obstructive pulmonary disease: Secondary | ICD-10-CM | POA: Diagnosis not present

## 2024-05-09 DIAGNOSIS — Z9981 Dependence on supplemental oxygen: Secondary | ICD-10-CM

## 2024-05-09 DIAGNOSIS — I5020 Unspecified systolic (congestive) heart failure: Secondary | ICD-10-CM

## 2024-05-09 MED ORDER — AIRSUPRA 90-80 MCG/ACT IN AERO: 1.0000 | INHALATION_SPRAY | Freq: Four times a day (QID) | RESPIRATORY_TRACT | 3 refills | Status: AC | PRN

## 2024-05-09 NOTE — Patient Instructions (Addendum)
 Price out Airsupra  rescue inhaler in place of plain albuterol .   Mobility assessment performed today. Let me know if your insurance has a preferred medical equipment supplier.

## 2024-05-09 NOTE — Progress Notes (Addendum)
 Ph: [phone] Fax: [phone]   Patient ID: Amy Barnes, female    DOB: 1951-06-24, 73 y.o.   MRN: [id number]  This visit was conducted in person.  BP 112/66   Pulse 62   Temp 98 F (36.7 C) (Oral)   Ht 5' 3 (1.6 m)   Wt (!) 344 lb (156 kg)   LMP  (LMP Unknown)   SpO2 97% Comment: 2 liters  BMI 60.94 kg/m    CC: mobility assessment  Subjective:   HPI: Amy Barnes is a 73 y.o. female presenting on 05/09/2024 for Mobility (Mobility check for Oxygen  and wheelchair)   [name]  has a past medical history of Allergic rhinitis, Ambulates with cane, Anemia, Anxiety, Asthma, Bipolar affective disorder (HCC), CAD (coronary artery disease) (07/2016), Carpal tunnel syndrome, Cataract, Centrilobular emphysema (HCC) (07/2016), Depression with anxiety, Diabetes mellitus, GERD (gastroesophageal reflux disease), History of MRSA infection (2015), Hyperlipidemia, Hypertension, Hypothyroidism, 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 (01/16/2018), OSA (obstructive sleep apnea), Osteoarthritis, Osteopenia (08/11/2019), Pneumonia due to COVID-19 virus (05/03/2020), Restless legs, Septic arthritis (HCC) (10/11/2012), Shingles (06/30/2016), and Thoracic aortic atherosclerosis (HCC) (11/207).   Here for mobility assessment.  Mobility device requested predominantly for at home use.  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  Mobility limitation isn't sufficiently or safely resolved by using a cane or walker: mostly non-ambulatory Lacks the hand/arm strength to operate a manual wheelchair: yes She can safely transfer to and from mobility device. She can operate tiller steering system. She can maintain postural stability and position while operating mobility device at home.  He has sufficient mental and physical  ability to safely operate mobility device at home.  Home allows adequate access between rooms, maneuvering space and surfaces: yes for wheelchair, no for scooter  Using mobility device will significantly improve ability to participate in MRADLs, and she will use it at home. She expresses willingness to use this at home. She would use power mobility device to get to bathroom, to get to kitchen, to prepare meals.  She is unable to get into scooter due to worsening knee and other joint pains when getting on scooter.   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 No significant benefit with symbicort . Continues 2L O2 per  at rest, 4L with exertion ([employer] DME supplier). No significant benefit with Breztri. Symbicort  unaffordable.   Pending evaluation for portable oxygen  concentrator through [employer].  They state we have to resubmit form.   S/p L hip replacement.   Ambulatory pulse ox today on O2 (2 L) 04/2024: SpO2 start:  94% SpO2 while ambulating- O2 removed:  88% SpO2 recovery:  94% (on 2 L O2)     Relevant past medical, surgical, family and social history reviewed and updated as indicated. Interim medical history since our last visit reviewed. Allergies and medications reviewed and updated. Outpatient Medications Prior to Visit  Medication Sig Dispense Refill   albuterol  (PROVENTIL ) (2.5 MG/3ML) 0.083% nebulizer solution Take 3 mLs (2.5 mg total) by nebulization every 6 (six) hours as needed for wheezing or shortness of breath. 75 mL 12   albuterol  (VENTOLIN  HFA) 108 (90 Base) MCG/ACT inhaler Inhale 2 puffs into the lungs every 6 (six) hours as needed for wheezing or shortness of breath. 1 each  6   aspirin  81 MG EC tablet Take 81 mg by mouth at bedtime.     budesonide -formoterol  (SYMBICORT ) 160-4.5 MCG/ACT inhaler Inhale 2 puffs into the lungs 2 (two) times daily. 10.2 each 11    carvedilol  (COREG ) 6.25 MG tablet TAKE 1 TABLET TWICE DAILY WITH MEALS (PLEASE KEEP UPCOMING APPT FOR FURTHER REFILLS) 180 tablet 3   Cholecalciferol  (VITAMIN D ) 50 MCG (2000 UT) CAPS Take 1 capsule (2,000 Units total) by mouth daily. 30 capsule    cycloSPORINE (RESTASIS) 0.05 % ophthalmic emulsion Place 1 drop into both eyes 2 (two) times daily.     doxycycline  (VIBRA -TABS) 100 MG tablet Take 1 tablet (100 mg total) by mouth 2 (two) times daily. 120 tablet 3   ferrous sulfate  325 (65 FE) MG EC tablet Take 1 tablet (325 mg total) by mouth every Monday, Wednesday, and Friday.     fluconazole  (DIFLUCAN ) 150 MG tablet Take 1 tablet (150 mg total) by mouth daily. 10 tablet 1   furosemide  (LASIX ) 20 MG tablet Take 1 tablet (20 mg total) by mouth 2 (two) times daily. 180 tablet 1   glimepiride  (AMARYL ) 1 MG tablet Take 1 tablet (1 mg total) by mouth daily with breakfast. 90 tablet 4   lamoTRIgine  (LAMICTAL ) 200 MG tablet Take 400 mg by mouth daily.     levothyroxine  (SYNTHROID ) 150 MCG tablet Take 1 tablet (150 mcg total) by mouth at bedtime. 90 tablet 4   montelukast  (SINGULAIR ) 10 MG tablet Take 1 tablet (10 mg total) by mouth at bedtime. 90 tablet 4   Multiple Vitamin (MULTIVITAMIN WITH MINERALS) TABS tablet Take 2 tablets by mouth daily.      nystatin  cream (MYCOSTATIN ) Apply 1 Application topically 2 (two) times daily. 30 g 0   pantoprazole  (PROTONIX ) 40 MG tablet Take 1 tablet (40 mg total) by mouth 2 (two) times daily. 180 tablet 4   potassium chloride  (KLOR-CON ) 10 MEQ tablet Take 1 tablet (10 mEq total) by mouth daily. With lasix . May take an additional 10 meq when taking 60 meq of lasix  100 tablet 3   PROZAC  20 MG capsule Take 60 mg by mouth daily.     simvastatin  (ZOCOR ) 40 MG tablet Take 1 tablet (40 mg total) by mouth every evening. 90 tablet 4   No facility-administered medications prior to visit.     Per HPI unless specifically indicated in ROS section below Review of  Systems  Objective:  BP 112/66   Pulse 62   Temp 98 F (36.7 C) (Oral)   Ht 5' 3 (1.6 m)   Wt (!) 344 lb (156 kg)   LMP  (LMP Unknown)   SpO2 97% Comment: 2 liters  BMI 60.94 kg/m   Wt Readings from Last 3 Encounters:  05/09/24 (!) 344 lb (156 kg)  04/15/24 (!) 344 lb 8 oz (156.3 kg)  04/08/24 (!) 375 lb (170.1 kg)      Physical Exam Vitals and nursing note reviewed.  Constitutional:      Appearance: Normal appearance. She is not ill-appearing.     Comments: Sitting in wheelchair, needs assistance to stand, ambulatory with walker for <161ft before having to sit down  HENT:     Head: Normocephalic and atraumatic.     Mouth/Throat:     Mouth: Mucous membranes are moist.     Pharynx: Oropharynx is clear. No oropharyngeal exudate or posterior oropharyngeal erythema.  Eyes:     Extraocular Movements: Extraocular movements intact.     Pupils:  Pupils are equal, round, and reactive to light.  Cardiovascular:     Rate and Rhythm: Normal rate and regular rhythm.     Pulses: Normal pulses.     Heart sounds: Normal heart sounds. No murmur heard. Pulmonary:     Effort: Pulmonary effort is normal. No respiratory distress.     Breath sounds: Normal breath sounds. No wheezing, rhonchi or rales.  Musculoskeletal:        General: Tenderness present.     Right lower leg: No edema.     Left lower leg: No edema.  Skin:    General: Skin is warm and dry.     Findings: No rash.  Neurological:     Mental Status: She is alert.  Psychiatric:        Mood and Affect: Mood normal.        Behavior: Behavior normal.       Lab Results  Component Value Date   NA 140 04/08/2024   CL 102 04/08/2024   K 4.4 04/08/2024   CO2 34 (H) 04/08/2024   BUN 17 04/08/2024   CREATININE 0.61 04/08/2024   GFR 89.68 04/08/2024   CALCIUM  9.1 04/08/2024   PHOS 2.8 05/08/2020   ALBUMIN  4.3 04/08/2024   GLUCOSE 215 (H) 04/08/2024    Lab Results  Component Value Date   HGBA1C 8.0 (H) 04/08/2024     Assessment & Plan:   Problem List Items Addressed This Visit     Impaired mobility and activities of daily living (Chronic)   See above. Mobility assessment performed today       Failed back surgical syndrome (Chronic)   Osteoarthritis involving multiple joints (Chronic)   Type 2 diabetes mellitus with peripheral neuropathy (HCC)   Working with pharmacist for Ozempic  PAP      Morbid obesity with BMI of 60.0-69.9, adult (HCC)   Working with pharmacy for Ozempic  PAP       Degenerative joint disease (DJD) of hip   DOE (dyspnea on exertion)   Asthma-COPD overlap syndrome (HCC)   Asthma-COPD overlap syndrome.  Symbicort  was unaffordable.  Airsupra  also unaffordable - will pursue PAP for AirSupra .  Consider PAP for triple inhaler therapy.  Last saw pulm 04/2022 (Dr Gladis)      Relevant Medications   Albuterol -Budesonide  (AIRSUPRA ) 90-80 MCG/ACT AERO   HFrEF (heart failure with reduced ejection fraction) (HCC)   Chronic respiratory failure with hypoxia, on home O2 therapy (HCC)   Ambulatory pulse ox today for POC Rx.  O2 sats drop to 88% off oxygen , maintain 90s on oxygen .  Will resubmit POC request.       Obesity hypoventilation syndrome (HCC)   Encounter for power mobility device assessment - Primary   Mobility assessment performed. She is appropriate for power wheelchair use.  Will write Rx for POV.  She would need large size power wheelchair.  I did ask pt/daughter to check with insurance on preferred DME supplier for power vehicles.         Meds ordered this encounter  Medications   Albuterol -Budesonide  (AIRSUPRA ) 90-80 MCG/ACT AERO    Sig: Inhale 1-2 puffs into the lungs every 6 (six) hours as needed (asthma flare).    Dispense:  10.7 g    Refill:  3    To replace albuterol  rescue inhaler    No orders of the defined types were placed in this encounter.   Patient Instructions  Price out Airsupra  rescue inhaler in place of plain albuterol .   Mobility  assessment performed today. Let me know if your insurance has a preferred medical equipment supplier.   Follow up plan: Return if symptoms worsen or fail to improve.  Anton Blas, MD

## 2024-05-09 NOTE — Telephone Encounter (Signed)
 Faxed form to [employer] at [phone].   Placed form and confirmation to be scanned.

## 2024-05-10 ENCOUNTER — Encounter: Payer: Self-pay | Admitting: Pharmacist

## 2024-05-10 NOTE — Progress Notes (Signed)
 Patient Assistance Program (PAP) Application   Manufacturer: [employer] ([employer])    (New enrollment) Medication(s): Airspura inhaler  Patient Portion of Application:  05/10/24: Mailed to patient's home from clinic per patient request.  Income Documentation: N/A - Electronic verification elected.  Next steps:  Once patient pages returned, provider pages to be completed.

## 2024-05-10 NOTE — Telephone Encounter (Signed)
 Printed orders and placed in Dr Talmadge box to sign.

## 2024-05-11 NOTE — Telephone Encounter (Signed)
Signed and in CMA box.  

## 2024-05-11 NOTE — Telephone Encounter (Signed)
 Placed order via [employer] and printed.   Faxed signed order to [employer] at [phone].

## 2024-05-12 ENCOUNTER — Telehealth: Payer: Self-pay

## 2024-05-12 DIAGNOSIS — Z7689 Persons encountering health services in other specified circumstances: Secondary | ICD-10-CM | POA: Insufficient documentation

## 2024-05-12 NOTE — Telephone Encounter (Signed)
 Spoke with pt relaying Dr Talmadge message. She verbalizes understanding and will get daughter, Mallie (on dpr), to check on this. Will have her call back with info.

## 2024-05-12 NOTE — Assessment & Plan Note (Addendum)
 Mobility assessment performed. She is appropriate for power wheelchair use.  Will write Rx for POV.  She would need large size power wheelchair.  I did ask pt/daughter to check with insurance on preferred DME supplier for power vehicles.

## 2024-05-12 NOTE — Assessment & Plan Note (Addendum)
 Asthma-COPD overlap syndrome.  Symbicort  was unaffordable.  Airsupra  also unaffordable - will pursue PAP for AirSupra .  Consider PAP for triple inhaler therapy.  Last saw pulm 04/2022 (Dr Gladis)

## 2024-05-12 NOTE — Assessment & Plan Note (Signed)
 Working with pharmacy for Ozempic  PAP

## 2024-05-12 NOTE — Telephone Encounter (Addendum)
-----   Message from Anton Blas sent at 05/12/2024  9:44 AM EDT ----- Regarding: POV Rx Mobility assessment completed this week. I had asked [name] to check on insurance preferred DME supplier for power operated vehicle (POV). Can we check with her to see where to send OV and POV Rx to? Thanks, Anton

## 2024-05-12 NOTE — Assessment & Plan Note (Signed)
 See above. Mobility assessment performed today

## 2024-05-12 NOTE — Assessment & Plan Note (Signed)
 Working with pharmacist for Ozempic  PAP

## 2024-05-12 NOTE — Assessment & Plan Note (Deleted)
 Mobility assessment performed. She is appropriate for power wheelchair use.  Will write Rx for POV.  She would need large power wheelchair.

## 2024-05-12 NOTE — Assessment & Plan Note (Signed)
 Ambulatory pulse ox today for POC Rx.  O2 sats drop to 88% off oxygen , maintain 90s on oxygen .  Will resubmit POC request.

## 2024-05-13 ENCOUNTER — Encounter: Payer: Self-pay | Admitting: Family Medicine

## 2024-05-14 NOTE — Telephone Encounter (Signed)
 See phone note 05/12/2024.

## 2024-05-16 NOTE — Telephone Encounter (Addendum)
 Yes please  Order written and in Amy Barnes's box.

## 2024-05-16 NOTE — Telephone Encounter (Signed)
 Added to open phone note.  No further action needed at this time.

## 2024-05-16 NOTE — Telephone Encounter (Signed)
 Added from my chart message. Ok to start order?   I spoke with Medicare today and they said that their preferred local provider for electric wheelchairs is [facility] [phone] @ [address] [place], [place], [place] [address].  Power Mobility Department Phone is  [phone] and fax is [phone].   Please let me know if you need anything else.    ThanksMallie Cluster [phone]

## 2024-05-18 NOTE — Telephone Encounter (Signed)
All information faxed as requested.

## 2024-05-27 ENCOUNTER — Telehealth: Payer: Self-pay | Admitting: Family Medicine

## 2024-05-27 NOTE — Telephone Encounter (Signed)
 Wilson, Renae L to [employer] RW    05/27/24  1:48 PM Placed in providers box for completion   Geronimo Manuelita SAUNDERS, RPH to Tanda Zenaida CROME     05/27/24  1:30 PM You may place in Dr. Talmadge folder for review/signature of the provider portion -- Thank you!    05/27/24  1:13 PM

## 2024-05-27 NOTE — Telephone Encounter (Signed)
 Husband dropped off ppwk for [place] placed in box at front desk

## 2024-06-01 ENCOUNTER — Other Ambulatory Visit

## 2024-06-01 ENCOUNTER — Encounter: Payer: Self-pay | Admitting: Pharmacist

## 2024-06-01 NOTE — Progress Notes (Deleted)
   06/01/2024 Name: Amy Barnes MRN: [id number] DOB: March 31, 1951  Subjective  No chief complaint on file.   Care Team: Primary Care Provider: Rilla Baller, MD  Reason for visit: ?  Amy Barnes is a 73 y.o. female who presents today for a telephone visit with the pharmacist due to medication access concerns regarding their Ozempic .   Medication Access: ?  Reports that all medications are not affordable.  Ozempic  covered by insurance, though copay is not affordable. Symbicort  also not affordable this year, copay >$400. Suspect drug deductible. Currently not using Symbicort  or albuterol .  On Ozempic  last year and tolerated well up to 0.5 mg dose though stopped at end of the year due to the donut hole.   Prescription drug coverage: YES Payor: MEDICARE / Plan: MEDICARE PART A AND B / Product Type: *No Product type* / .   Current Patient Assistance: None  Patient lives in a household of 2 with husband with an estimated income just over ~$50k  Medicare LIS Eligible: No   AZ&Me: 300% [employer] LAMA/LABA/ICS    Breztri (glycopyrrolate /formoterol /budesonide ) SABA-ICS     Airspura (albuterol /budesonide )   Assessment and Plan:   1. Medication Access Diabetes Ozempic : Likely eligible. Previously tolerated 0.5 mg w no side effects and was able to achieve A1c <7%. Likely she will achieve goal A1c on low dose Ozempic , though would benefit from further titration if tolerated for additional benefits including CAD risk reduction, OSA, weight loss.  If any c/f side effects, can microdose to main Asthma/COPD Symbicort  (formoterol /mometasone  - SABA/ICS) not available through PAP given generic status. Newly prescribed for asthma/copd overlap syndrome. Not taking due to cost.  Reasonable alternative = Airspura PRN through AZ&Me (albuterol /ICS). Will discuss with PCP. Triple therapy also reasonable if maintenance for COPD deemed appropriate. COPD Severity not clear.   Future Appointments  Date  Time Provider Department Center  10/17/2024  8:30 AM [facility] [address]  04/11/2025 10:50 AM [facility] ANNUAL WELLNESS VISIT 1 [facility] [address]  04/14/2025  8:30 AM [facility] [address]  04/21/2025  3:30 PM Rilla Baller, MD [facility] [address]    [name], PharmD Clinical Pharmacist [facility] [phone]

## 2024-06-01 NOTE — Telephone Encounter (Addendum)
 Signed and in Amy's box.

## 2024-06-01 NOTE — Telephone Encounter (Signed)
 I don't believe I ever received this paperwork. Was this taken care of?

## 2024-06-01 NOTE — Progress Notes (Signed)
 Patient Assistance Program (PAP) Application   Manufacturer: Novo Nordisk  New enrollment Medication(s): Ozempic   Provider Portion of Application:  PCP pages faxed 8/22 though were printed on an old/outdated version of the application.  Unclear where provider pages from PharmD ended up. Re-printed and placed in provider folder for review/signature.

## 2024-06-02 NOTE — Telephone Encounter (Signed)
 Another application has been received and placed in your box. One for [place] has been faxed.

## 2024-06-03 NOTE — Telephone Encounter (Signed)
 Faxed form to [employer] for Ozempic .   Placed form and fax confirmation to be scanned.

## 2024-06-03 NOTE — Telephone Encounter (Addendum)
 Signed and in Amy's box This is for ozempic .

## 2024-06-07 ENCOUNTER — Encounter: Payer: Self-pay | Admitting: Pharmacist

## 2024-06-07 NOTE — Progress Notes (Signed)
 Chart Review Reason: Patient assistance follow up   Summary: AZ&Me ID: [name] Called AZ&Me program to assess status of application.  Patient has been APPROVED through 09/28/2025 for [employer] ID: [id number] Called Novo and confirmed that updated prescriber pages sent last week were received. Provider portion complete. Novo now states that they need an updated patient section.   Patient section re-completed via online form. Submitted 06/07/24.   Amy Barnes, PharmD Clinical Pharmacist [facility] [phone]

## 2024-06-21 ENCOUNTER — Encounter: Payer: Self-pay | Admitting: Pharmacist

## 2024-06-21 NOTE — Progress Notes (Signed)
 Patient Assistance Program (PAP) Application   Manufacturer: Novo Nordisk Medication(s): Ozempic  0.25 - 2.0  Called Novo today to confirm status of application.  Patient has been APPROVED for Novo program through 09/28/24.   Chart forwarded to [facility] team for future tracking and re-enrollment.

## 2024-06-22 ENCOUNTER — Other Ambulatory Visit (INDEPENDENT_AMBULATORY_CARE_PROVIDER_SITE_OTHER): Admitting: Pharmacist

## 2024-06-22 DIAGNOSIS — E1142 Type 2 diabetes mellitus with diabetic polyneuropathy: Secondary | ICD-10-CM

## 2024-06-22 DIAGNOSIS — J4489 Other specified chronic obstructive pulmonary disease: Secondary | ICD-10-CM

## 2024-06-22 NOTE — Progress Notes (Signed)
   06/22/2024 Name: Amy Barnes MRN: [id number] DOB: 13-Jan-1951  Subjective  Chief Complaint  Patient presents with   Medication Access    Care Team: Primary Care Provider: Rilla Baller, MD  Reason for visit: ?  Amy Barnes is a 73 y.o. female who presents today for a follow up telephone visit with the pharmacist regarding medication access follow up/medication management.   Medication Access: ?  Reports that all medications are not affordable.  Has been approved for AZ&Me PAP. Have received confirmation of first shipment. Patient confirms today she did receive her first order of Airspura. Does not have questions on how to use the device.  Approved recently for Novo Nordisk to receive Ozempic  0.25 through 2.0 mg. Order remains in process today.   Prescription drug coverage: YES Payor: MEDICARE / Plan: MEDICARE PART A AND B / Product Type: *No Product type* / .   Current Patient Assistance: None  Patient lives in a household of 2 with husband with an estimated income just over ~$50k  Medicare LIS Eligible: No   Assessment and Plan:   1. Medication Access Diabetes Ozempic  approved. Patient aware this can take 10-14 business days to arrive at clinic and she will receive a phone call when medicine is ready to pick up. Sent patient direct message regarding the above as well as direct line for myself to schedule training visit (nurse or PharmD) if desired.   Message forwarded to [facility] Med Access team regarding AZ&Me/Novo approval for help with future enrollment.   Future Appointments  Date Time Provider Department Center  10/06/2024  1:30 PM Luiz Channel, MD [facility]  10/17/2024  8:30 AM [facility] [address]  04/11/2025 10:50 AM [facility] ANNUAL WELLNESS VISIT 1 [facility] [address]  04/14/2025  8:30 AM [facility] [address]  04/21/2025  3:30 PM Rilla Baller, MD [facility] [address]    Amy Barnes, PharmD Clinical Pharmacist [facility] [phone]

## 2024-06-22 NOTE — Patient Instructions (Signed)
 [name],   It was a pleasure to speak with you today! As we discussed:?   Your new rescue inhaler replaced the Spiriva. You should continue receiving this inhaler free through the mail. We can re-apply for the program each year as needed.  You have also recently been approved for the Ozempic  program. They should deliver the medicine to clinic within 7-14 business days. Dr. Talmadge nurse will call you once this is ready to pick up.   If you are still interested in a brief injection training visit in clinic once your order arrives, please feel free to message me or leave a voicemail and we can find a day/time that works best with your schedule.   You may respond directly to this message, or leave me a voicemail at [phone] and I will get back to you shortly.   Thank you!   [name], PharmD Clinical Pharmacist [facility] [phone]

## 2024-07-04 ENCOUNTER — Encounter: Payer: Self-pay | Admitting: Pharmacist

## 2024-07-04 ENCOUNTER — Telehealth: Payer: Self-pay

## 2024-07-04 NOTE — Telephone Encounter (Signed)
 Gave pt a call,pt is coming up due for reenrollment on AZ&ME airspura,left a HIPAA VM ,will mail out pt portion and faxed provider portion today.

## 2024-07-06 ENCOUNTER — Telehealth: Payer: Self-pay | Admitting: Family Medicine

## 2024-07-06 NOTE — Telephone Encounter (Signed)
 Received patient assistance medications for patient.  Ozempic  0.25 2 boxes  4mg  1 box 8 mg 1 box   Medications have been placed in the refrigerator and labeled with patient information  and Patient has been informed can pick up medications in our office during normal business hours.

## 2024-07-06 NOTE — Telephone Encounter (Signed)
 Pt's husband came by and picked up form.

## 2024-09-01 ENCOUNTER — Other Ambulatory Visit: Payer: Self-pay | Admitting: Cardiology

## 2024-09-09 ENCOUNTER — Ambulatory Visit: Admitting: Family Medicine

## 2024-09-09 ENCOUNTER — Ambulatory Visit
Admission: RE | Admit: 2024-09-09 | Discharge: 2024-09-09 | Disposition: A | Source: Ambulatory Visit | Attending: Family Medicine | Admitting: Family Medicine

## 2024-09-09 ENCOUNTER — Encounter: Payer: Self-pay | Admitting: Family Medicine

## 2024-09-09 VITALS — BP 130/72 | HR 73 | Temp 97.9°F

## 2024-09-09 DIAGNOSIS — M542 Cervicalgia: Secondary | ICD-10-CM | POA: Diagnosis not present

## 2024-09-09 MED ORDER — METHOCARBAMOL 500 MG PO TABS: 500.0000 mg | ORAL_TABLET | Freq: Three times a day (TID) | ORAL | 0 refills | Status: AC | PRN

## 2024-09-09 NOTE — Patient Instructions (Addendum)
 Short term heat as needed  Gentle stretching if needed   Try the methocarbamol  as needed (muscle relaxer) but caution of sedation   Xray of neck now  We will reach out with results     Follow up with Dr KANDICE for mobility exam   If pain becomes unbearable-go to the ER If it radiates to arms/hands let us  know

## 2024-09-09 NOTE — Assessment & Plan Note (Addendum)
 Acute on chronic (years) Fusion in past after injury from lifting (pt doubts she had fractures) Unsure what diagnosis was  Pain ever since/now worsening  Worse on left - worse with right sided rotation Prevents sleep  Takes tylenol    Overall reassuring exam/no neuro findings  Tenderness over entire neck is notable with limited rom for pain   Old records reviewed  Xray cs ordered : noting no acute change or hardware abnormality, degenerative facet change at C2-C3, C3-4 and osteophyte C1  Follow up with Dr Mavis as planned Trial of methocarbamol  with caution of sedation-has had this before  ? If PT would be helpful   Call back and Er precautions noted in detail today    I personally spent a total of 30 minutes in the care of the patient today including preparing to see the patient, getting/reviewing separately obtained history, performing a medically appropriate exam/evaluation, counseling and educating, placing orders, documenting clinical information in the EHR, and communicating results.

## 2024-09-09 NOTE — Progress Notes (Signed)
 Subjective:    Patient ID: Amy Barnes, female    DOB: 01/28/1951, 73 y.o.   MRN: [id number]  HPI  Wt Readings from Last 3 Encounters:  05/09/24 (!) 344 lb (156 kg)  04/15/24 (!) 344 lb 8 oz (156.3 kg)  04/08/24 (!) 375 lb (170.1 kg)      Vitals:   09/09/24 1358  BP: 130/72  Pulse: 73  Temp: 97.9 F (36.6 C)  SpO2: 92%    73 yo pt of Dr KANDICE presents with neck pain   Pmhx notable for lumbar DD , spinal stenosis and chronic pain syndrome as well as mobility impaired  Neuro surg for LS is Dr Mavis   History of neck problems  C2,3,4 in 2001  From an injury ? 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 [place]   Has had pain ever since  Did have some injections - at [place] (pain management clinic) - that provider retired    Now hurts all the time  2-3 months  Needs xrays before seeing Dr Mavis   No new trauma   Left side of neck-sometimes goes to right side Sharp and dull pain both  Pops a lot  No numbness or tingling  No radiation to arm or hand   It does hurt when not moving  Worst when in her zero gravity recliner (cannot lie flat)  No good position  Worse to turn right than left  Worse to flex than extend    Heat pack helps some Has not used ice  Essential oils do not help  Voltaren gel- does not help    Acetaminophen  for arthritis   Took gabapentin  for back- stopped it because it did not help  Tramadol  -does not help  Has old oxycodone  - takes very infrequently / makes her loopy  Shot or two of vodka puts her out /so if pain will do that    Has appointment with Dr Mavis  a month for this    Imaging today DG Cervical Spine Complete Result Date: 09/09/2024 EXAM: 6 OR MORE VIEW(S) XRAY OF THE CERVICAL SPINE 09/09/2024 03:07:48 PM COMPARISON: None available. CLINICAL HISTORY: acute on chronic cervical pain worse on left /no radicular pain h/o fusion in the past FINDINGS: BONES: Vertebral body heights are maintained. Alignment is  anatomic. No evidence for hardware loosening or acute fracture. There is osteophyte formation involving the anterior arch of C1 and the dens. DISCS AND DEGENERATIVE CHANGES: Degenerative changes of the facet joints bilaterally at C2-C3 and C3-C4. SOFT TISSUES: There is anterior pleural effusion at C4, C5, C6 and C7. There is no prevertebral soft tissue swelling. The visualized lungs appear clear. IMPRESSION: 1. No acute osseous abnormality or hardware complication. 2. Degenerative facet arthropathy at C2-C3 and C3-C4. 3. Osteophyte formation at the anterior arch of C1 and the dens. Electronically signed by: Greig Pique MD 09/09/2024 03:40 PM EST RP Workstation: [id number]    Patient Active Problem List   Diagnosis Date Noted   Neck pain 09/09/2024   Encounter for power mobility device assessment 05/12/2024   Fall with injury 04/16/2024   Restrictive lung disease 01/20/2022   Medicare annual wellness visit, subsequent 02/09/2021   DNR (do not resuscitate) 05/31/2020   Chronic respiratory failure with hypoxia, on home O2 therapy (HCC) 05/04/2020   Obesity hypoventilation syndrome (HCC) 05/04/2020   LVH (left ventricular hypertrophy) 05/04/2020   Non-ischemic cardiomyopathy (HCC) 12/29/2019   HFrEF (heart failure with reduced ejection fraction) (  HCC) 11/25/2019   UTI (urinary tract infection) 11/25/2019   NSTEMI (non-ST elevated myocardial infarction) (HCC) 11/09/2019   QT prolongation 11/09/2019   Nausea and vomiting 11/03/2019   Diminished pulses in lower extremity 08/11/2019   Osteopenia 08/11/2019   Wound infection complicating hardware 04/01/2019   Lumbar adjacent segment disease with spondylolisthesis 03/23/2019   Neurogenic urinary incontinence 11/29/2018   Mixed incontinence 11/29/2018   Bladder spasms 11/29/2018   Discogenic low back pain 11/29/2018   Chronic sacroiliac joint pain (Bilateral) (R>L) 11/29/2018   Spondylosis without myelopathy or radiculopathy, lumbar region  11/09/2018   Chronic low back pain (Bilateral) (L>R) w/o sciatica 11/09/2018   History of hip replacement (Left) 10/13/2018   History of back surgery 10/13/2018   Vitamin D  insufficiency 10/13/2018   Osteoarthritis of facet joint of lumbar spine 10/13/2018   Osteoarthritis involving multiple joints 10/13/2018   Cervical radiculopathy (Left) 10/13/2018   Lumbar facet arthropathy (Bilateral) 10/13/2018   Lumbar facet syndrome (Bilateral) (L>R) 10/13/2018   Abnormal MRI, lumbar spine (09/16/2018) 10/13/2018   Lumbar nerve root compression (L4) (Left) 10/13/2018   Lumbar foraminal stenosis 10/13/2018   Chronic hip pain after total replacement x 3 (Left) 10/13/2018   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   Chronic low back pain (Primary Area of Pain) (Bilateral) (L>R) w/ sciatica (Left) 09/14/2018   Chronic lower extremity pain (Secondary Area of Pain) (Left) 09/14/2018   Chronic hip pain (Tertiary Area of Pain) (Bilateral) (L>R) 09/14/2018   Chronic knee pain (Fourth Area of Pain) (Bilateral) (R>L) 09/14/2018   Polypharmacy 09/14/2018   Impaired mobility and activities of daily living 09/14/2018   Primary osteoarthritis of first carpometacarpal joint of left hand 04/20/2018   PAH (pulmonary artery hypertension) (HCC) 08/03/2017   Advanced care planning/counseling discussion 06/25/2017   Synovial cyst of lumbar facet joint 11/13/2016   CAD (coronary artery disease) 07/30/2016   Asthma-COPD overlap syndrome (HCC) 07/30/2016   Ex-smoker 07/08/2016   Nonspecific abnormal electrocardiogram (ECG) (EKG) 07/08/2016   Chronic lumbar radicular pain (L4) (Bilateral) 06/09/2016   DOE (dyspnea on exertion) 05/22/2016   Degenerative joint disease (DJD) of hip 04/17/2016   Somatic dysfunction of sacroiliac joint 04/17/2016   S/P Lumbar Fusion (L3-4 PLIF) 04/17/2016   Cervical fusion syndrome 04/17/2016   Pedal edema 02/19/2016   Chronic pain syndrome 02/19/2016   Aortic atherosclerosis  12/11/2015   MRSA (methicillin resistant Staphylococcus aureus) infection 10/11/2012   Infection or inflammatory reaction due to internal joint prosthesis 12/03/2011   Morbid obesity with BMI of 60.0-69.9, adult (HCC) 05/13/2011   Essential hypertension 05/17/2010   Cervical radiculopathy (Right) 12/25/2009   Benign positional vertigo 07/21/2008   Hyperlipidemia associated with type 2 diabetes mellitus (HCC) 08/16/2007   Hypothyroidism 07/30/2007   Type 2 diabetes mellitus with peripheral neuropathy (HCC) 07/30/2007   Normocytic anemia 07/30/2007   Bipolar II disorder (HCC) 07/30/2007   Allergic rhinitis 07/30/2007   GERD 07/30/2007   Osteoarthritis 07/30/2007   OSA treated with BiPAP 07/30/2007   Past Medical History:  Diagnosis Date   Allergic rhinitis    Ambulates with cane    straight   Anemia    Anxiety    Asthma    seasonal   Bipolar affective disorder (HCC)    takes Synthroid  meds for Bipolar   CAD (coronary artery disease) 07/2016   by CT scan   Carpal tunnel syndrome    had surgery but occasional still has some issues per patient   Cataract    Centrilobular emphysema (  HCC) 07/2016   by CT scan - pt not aware of this   Depression with anxiety    Diabetes mellitus    type 2 - no meds diet controlled   GERD (gastroesophageal reflux disease)    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 - 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  PO   Hyperlipidemia    Hypertension    Hypothyroidism    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 01/16/2018   prevnar 13 - large localized reaction 12/2017   OSA (obstructive sleep apnea)    no longer using cpap, uses a bed that raises and lowers hob   Osteoarthritis    Osteopenia 08/11/2019   DEXA 07/2019 - T -1.1 R femur (osteopenia)   Pneumonia due to COVID-19 virus 05/03/2020   Restless legs    Septic arthritis (HCC) 10/11/2012   Shingles 06/30/2016   Thoracic aortic atherosclerosis 11/207   by CT   Past Surgical History:  Procedure Laterality  Date   BREAST CYST ASPIRATION     CARPAL TUNNEL RELEASE Right 05/15/2011   CARPAL TUNNEL RELEASE Left 12/24/2017   Procedure: LEFT CARPAL TUNNEL RELEASE;  Surgeon: Murrell Drivers, MD;  Location: [facility];  Service: Orthopedics;  Laterality: Left;   CERVICAL FUSION  2012   C2/3/4   COLONOSCOPY WITH PROPOFOL  N/A 12/31/2015   diverticulosis, int hem, o/w normal rpt 10 yrs (Rein)   ESOPHAGOGASTRODUODENOSCOPY (EGD) WITH PROPOFOL  N/A 11/12/2019   Procedure: ESOPHAGOGASTRODUODENOSCOPY (EGD) WITH PROPOFOL ;  Surgeon: Legrand Victory LITTIE DOUGLAS, MD;  Location: [facility];  Service: Gastroenterology;  Laterality: N/A;   EYE SURGERY Bilateral    cataract surgery with lens implant   FOOT SURGERY  1982   bone spur   I & D EXTREMITY  09/06/2012   Ozell VEAR Bruch, MD; Right;  I&D of right thigh   I & D EXTREMITY Left 07/2013   wound vac - daily doxycycline  indefinitely   KNEE ARTHROSCOPY  09/01/2012   Marcey Raman, MD;  Right   LEFT HEART CATH AND CORONARY ANGIOGRAPHY N/A 11/10/2019   Procedure: LEFT HEART CATH AND CORONARY ANGIOGRAPHY;  Surgeon: Burnard Debby LABOR, MD;  Location: [facility];  Service: Cardiovascular;  Laterality: N/A;   LUMBAR FUSION  01/08/2012   L3-4   LUMBAR FUSION  02/2019   unexpectedly discovered MRSA infection - pus Gavin)    LUMBAR LAMINECTOMY/DECOMPRESSION MICRODISCECTOMY N/A 11/13/2016   LAMINOTOMY/LAMINECTOMY LUMBAR FOUR LUMBAR FIVE  WITH RESECTION OF SYNOVIAL CYST;  Surgeon: Reyes Budge, MD   NECK SURGERY     Herniated disk C2,3,4   NOSE SURGERY     PARTIAL HYSTERECTOMY  1984   for mennorhagia, ovaries remain   REVISION TOTAL HIP ARTHROPLASTY Left 08/28/2011   TMJ ARTHROPLASTY  1982   TOTAL HIP ARTHROPLASTY Left 2002   TRIGGER FINGER RELEASE Right 05/15/2011   long finger   Social History[1] Family History  Problem Relation Age of Onset   Aneurysm Father 45       brain   Alcohol abuse Father    Cancer Father        possibly   CAD Other         several siblings   Cancer Brother        prostate   Diabetes Brother    Diabetes Sister    Anesthesia problems Neg Hx    Hypotension Neg Hx    Malignant hyperthermia Neg Hx    Pseudochol deficiency Neg Hx    Breast cancer  Neg Hx    Allergies[2] Medications Ordered Prior to Encounter[3]  Review of Systems  Constitutional:  Positive for fatigue.  Musculoskeletal:  Positive for back pain and neck pain.  Neurological:  Positive for weakness. Negative for numbness.       Objective:   Physical Exam Constitutional:      General: She is not in acute distress.    Appearance: Normal appearance. She is well-developed. She is obese. She is not ill-appearing or diaphoretic.     Comments: In wheelchair   HENT:     Head: Normocephalic and atraumatic.  Eyes:     Conjunctiva/sclera: Conjunctivae normal.     Pupils: Pupils are equal, round, and reactive to light.  Neck:     Thyroid : No thyromegaly.     Vascular: No carotid bruit or JVD.  Cardiovascular:     Rate and Rhythm: Normal rate and regular rhythm.     Heart sounds: Normal heart sounds.     No gallop.  Pulmonary:     Effort: Pulmonary effort is normal. No respiratory distress.     Breath sounds: Normal breath sounds. No wheezing or rales.  Abdominal:     General: There is no distension or abdominal bruit.     Palpations: Abdomen is soft.  Musculoskeletal:     Cervical back: Neck supple. Spasms, tenderness, bony tenderness and crepitus present. No swelling, deformity, rigidity or torticollis. Pain with movement present. Decreased range of motion.     Right lower leg: No edema.     Left lower leg: No edema.     Comments: Movement pain in neck in all directions  (worst with rotation to right ) Diffuse tenderness-more muscles than bones , worse on left   Lymphadenopathy:     Cervical: No cervical adenopathy.  Skin:    General: Skin is warm and dry.     Coloration: Skin is not pale.     Findings: No rash.  Neurological:      Mental Status: She is alert.     Coordination: Coordination normal.     Deep Tendon Reflexes: Reflexes are normal and symmetric. Reflexes normal.     Comments: No acute weakness or sensory changes in UEs  Psychiatric:        Mood and Affect: Mood normal.           Assessment & Plan:   Problem List Items Addressed This Visit       Other   Neck pain - Primary   Acute on chronic (years) Fusion in past after injury from lifting (pt doubts she had fractures) Unsure what diagnosis was  Pain ever since/now worsening  Worse on left - worse with right sided rotation Prevents sleep  Takes tylenol    Overall reassuring exam/no neuro findings  Tenderness over entire neck is notable with limited rom for pain   Old records reviewed  Xray cs ordered : noting no acute change or hardware abnormality, degenerative facet change at C2-C3, C3-4 and osteophyte C1  Follow up with Dr Mavis as planned Trial of methocarbamol  with caution of sedation-has had this before  ? If PT would be helpful   Call back and Er precautions noted in detail today    I personally spent a total of 30 minutes in the care of the patient today including preparing to see the patient, getting/reviewing separately obtained history, performing a medically appropriate exam/evaluation, counseling and educating, placing orders, documenting clinical information in the EHR, and communicating results.  Relevant Orders   DG Cervical Spine Complete (Completed)      [1]  Social History Tobacco Use   Smoking status: Former    Current packs/day: 0.00    Average packs/day: 1.5 packs/day for 40.0 years (60.0 ttl pk-yrs)    Types: Cigarettes    Start date: 04/29/1969    Quit date: 04/29/2009    Years since quitting: 15.3   Smokeless tobacco: Never  Vaping Use   Vaping status: Never Used  Substance Use Topics   Alcohol use: Yes    Alcohol/week: 2.0 standard drinks of alcohol    Types: 2 Shots of liquor per week     Comment: 2 shots/weekly   Drug use: No  [2]  Allergies Allergen Reactions   Toviaz  [Fesoterodine ] Nausea And Vomiting    Severe reaction - s/p several ER visits then hospitalization ?stress induced cardiomyopathy   Cephalexin Hives   Hydrocodone  Other (See Comments)    Reaction:  Hallucinations    Entresto  [Sacubitril -Valsartan ]    Macrodantin  [Nitrofurantoin ]     Dyspnea, lips peeling   Metformin  And Related Other (See Comments)    Pedal edema   Tolterodine  Tartrate Nausea And Vomiting   Risperidone And Paliperidone Other (See Comments)    Reaction:  Made pt excessively sleepy   Seroquel [Quetiapine Fumarate] Other (See Comments)    Reaction:  Made pt excessively sleepy   Sulfa  Antibiotics Rash    Tolerated bactrim  course well 06/2020  [3]  Current Outpatient Medications on File Prior to Visit  Medication Sig Dispense Refill   albuterol  (PROVENTIL ) (2.5 MG/3ML) 0.083% nebulizer solution Take 3 mLs (2.5 mg total) by nebulization every 6 (six) hours as needed for wheezing or shortness of breath. 75 mL 12   Albuterol -Budesonide  (AIRSUPRA ) 90-80 MCG/ACT AERO Inhale 1-2 puffs into the lungs every 6 (six) hours as needed (asthma flare). 10.7 g 3   aspirin  81 MG EC tablet Take 81 mg by mouth at bedtime.     carvedilol  (COREG ) 6.25 MG tablet TAKE 1 TABLET TWICE DAILY WITH MEALS (PLEASE KEEP UPCOMING APPT FOR FURTHER REFILLS) 180 tablet 3   Cholecalciferol  (VITAMIN D ) 50 MCG (2000 UT) CAPS Take 1 capsule (2,000 Units total) by mouth daily. 30 capsule    cycloSPORINE (RESTASIS) 0.05 % ophthalmic emulsion Place 1 drop into both eyes 2 (two) times daily.     doxycycline  (VIBRA -TABS) 100 MG tablet Take 1 tablet (100 mg total) by mouth 2 (two) times daily. 120 tablet 3   ferrous sulfate  325 (65 FE) MG EC tablet Take 1 tablet (325 mg total) by mouth every Monday, Wednesday, and Friday.     fluconazole  (DIFLUCAN ) 150 MG tablet Take 1 tablet (150 mg total) by mouth daily. 10 tablet 1   furosemide   (LASIX ) 20 MG tablet TAKE 1 TABLET TWICE DAILY 60 tablet 0   glimepiride  (AMARYL ) 1 MG tablet Take 1 tablet (1 mg total) by mouth daily with breakfast. 90 tablet 4   lamoTRIgine  (LAMICTAL ) 200 MG tablet Take 400 mg by mouth daily.     levothyroxine  (SYNTHROID ) 150 MCG tablet Take 1 tablet (150 mcg total) by mouth at bedtime. 90 tablet 4   montelukast  (SINGULAIR ) 10 MG tablet Take 1 tablet (10 mg total) by mouth at bedtime. 90 tablet 4   Multiple Vitamin (MULTIVITAMIN WITH MINERALS) TABS tablet Take 2 tablets by mouth daily.      nystatin  cream (MYCOSTATIN ) Apply 1 Application topically 2 (two) times daily. 30 g 0   pantoprazole  (PROTONIX )  40 MG tablet Take 1 tablet (40 mg total) by mouth 2 (two) times daily. 180 tablet 4   potassium chloride  (KLOR-CON ) 10 MEQ tablet Take 1 tablet (10 mEq total) by mouth daily. With lasix . May take an additional 10 meq when taking 60 meq of lasix  100 tablet 3   PROZAC  20 MG capsule Take 60 mg by mouth daily.     simvastatin  (ZOCOR ) 40 MG tablet Take 1 tablet (40 mg total) by mouth every evening. 90 tablet 4   No current facility-administered medications on file prior to visit.

## 2024-09-11 ENCOUNTER — Ambulatory Visit: Payer: Self-pay | Admitting: Family Medicine

## 2024-09-16 ENCOUNTER — Ambulatory Visit (INDEPENDENT_AMBULATORY_CARE_PROVIDER_SITE_OTHER): Admitting: Family Medicine

## 2024-09-16 ENCOUNTER — Encounter: Payer: Self-pay | Admitting: Family Medicine

## 2024-09-16 VITALS — BP 134/66 | HR 72 | Temp 97.9°F | Ht 63.0 in | Wt 353.0 lb

## 2024-09-16 DIAGNOSIS — Z6841 Body Mass Index (BMI) 40.0 and over, adult: Secondary | ICD-10-CM | POA: Diagnosis not present

## 2024-09-16 DIAGNOSIS — M150 Primary generalized (osteo)arthritis: Secondary | ICD-10-CM

## 2024-09-16 DIAGNOSIS — E662 Morbid (severe) obesity with alveolar hypoventilation: Secondary | ICD-10-CM | POA: Diagnosis not present

## 2024-09-16 DIAGNOSIS — M545 Low back pain, unspecified: Secondary | ICD-10-CM

## 2024-09-16 DIAGNOSIS — G894 Chronic pain syndrome: Secondary | ICD-10-CM

## 2024-09-16 DIAGNOSIS — M25561 Pain in right knee: Secondary | ICD-10-CM

## 2024-09-16 DIAGNOSIS — G8929 Other chronic pain: Secondary | ICD-10-CM | POA: Diagnosis not present

## 2024-09-16 DIAGNOSIS — Z7689 Persons encountering health services in other specified circumstances: Secondary | ICD-10-CM

## 2024-09-16 DIAGNOSIS — Z9981 Dependence on supplemental oxygen: Secondary | ICD-10-CM

## 2024-09-16 DIAGNOSIS — M25562 Pain in left knee: Secondary | ICD-10-CM

## 2024-09-16 DIAGNOSIS — M961 Postlaminectomy syndrome, not elsewhere classified: Secondary | ICD-10-CM | POA: Diagnosis not present

## 2024-09-16 DIAGNOSIS — Z7409 Other reduced mobility: Secondary | ICD-10-CM

## 2024-09-16 DIAGNOSIS — I5020 Unspecified systolic (congestive) heart failure: Secondary | ICD-10-CM

## 2024-09-16 DIAGNOSIS — J9611 Chronic respiratory failure with hypoxia: Secondary | ICD-10-CM | POA: Diagnosis not present

## 2024-09-16 NOTE — Progress Notes (Signed)
 " Ph: [phone] Fax: [phone]   Patient ID: Amy Barnes, female    DOB: [birth year], 73 y.o.   MRN: [id number]  This visit was conducted in person.  BP 134/66   Pulse 72   Temp 97.9 F (36.6 C) (Oral)   Ht 5' 3 (1.6 m)   Wt (!) 353 lb (160.1 kg) Comment: W/c  LMP  (LMP Unknown)   SpO2 95% Comment: 3 L O2  BMI 62.53 kg/m   Wt Readings from Last 3 Encounters:  09/16/24 (!) 353 lb (160.1 kg)  05/09/24 (!) 344 lb (156 kg)  04/15/24 (!) 344 lb 8 oz (156.3 kg)   CC: mobility assessment  Subjective:   HPI: Amy Barnes is a 73 y.o. female presenting on 09/16/2024 for Mobility Assessment (Here for mobility assessment. Needs form completed. Pt accompanied by daughter, Amy Barnes and husband, Amy Barnes. )   Amy Barnes  has a past medical history of Allergic rhinitis, Ambulates with cane, Anemia, Anxiety, Asthma, Bipolar affective disorder (HCC), CAD (coronary artery disease) (07/2016), Carpal tunnel syndrome, Cataract, Centrilobular emphysema (HCC) (07/2016), Depression with anxiety, Diabetes mellitus, GERD (gastroesophageal reflux disease), History of MRSA infection (2015), Hyperlipidemia, Hypertension, Hypothyroidism, 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 (01/16/2018), OSA (obstructive sleep apnea), Osteoarthritis, Osteopenia (08/11/2019), Pneumonia due to COVID-19 virus (05/03/2020), Restless legs, Septic arthritis (HCC) (10/11/2012), Shingles (06/30/2016), and Thoracic aortic atherosclerosis (11/207).   Mobility assessment completed 05/09/2024.  Medicare preferred local provider for electric wheelchairs is [facility] [phone] @ [address] [place], [place], [place] [address]. Power Mobility Department Phone is [phone] and fax is [phone].   New assessment needed - needs to be done within 30 days of power device prescription.   Here for mobility assessment.  Mobility device requested predominantly for at home use.  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  Mobility limitation isn't sufficiently or safely resolved by using a cane or walker: mostly non-ambulatory Lacks the hand/arm strength to operate a manual wheelchair: yes She can safely transfer to and from mobility device. She can operate tiller steering system. She can maintain postural stability and position while operating mobility device at home.  He has sufficient mental and physical ability to safely operate mobility device at home.  Home allows adequate access between rooms, maneuvering space and surfaces: yes for wheelchair, no for scooter  Using mobility device will significantly improve ability to participate in MRADLs, and she will use it at home. She expresses willingness to use this at home. She would use power mobility device to get to bathroom, to get to kitchen, to prepare meals.  She is unable to get into scooter due to worsening knee and other joint pains when getting on scooter.    S/p L hip replacement.   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 No significant benefit with symbicort . Continues 2L O2 per [place] at rest, 4L with exertion ([employer] D[employer] supplier). No significant benefit with Breztri. Symbicort  unaffordable.     Ambulatory pulse ox on O2 (2 L) 04/2024: SpO2 start:  94% SpO2 while ambulating- O2 removed:  88% SpO2 recovery:  94% (on 2 L O2)  Saw Dr Tower last week with worsening neck pain s/p cervical films Upcoming appt with NSG Dr Mavis 09/27/2024.  Relevant past medical, surgical, family and social history reviewed and updated as indicated. Interim medical history since our last visit reviewed. Allergies and medications reviewed and updated. Outpatient Medications Prior to Visit   Medication Sig Dispense Refill   albuterol  (PROVENTIL ) (2.5 MG/3ML) 0.083% nebulizer solution Take 3 mLs (2.5 mg total) by nebulization every 6 (six) hours as needed for wheezing or shortness of breath. 75 mL 12   Albuterol -Budesonide  (AIRSUPRA ) 90-80 MCG/ACT AERO Inhale 1-2 puffs into the lungs every 6 (six) hours as needed (asthma flare). 10.7 g 3   aspirin  81 MG EC tablet Take 81 mg by mouth at bedtime.     carvedilol  (COREG ) 6.25 MG tablet TAKE 1 TABLET TWICE DAILY WITH MEALS (PLEASE KEEP UPCOMING APPT FOR FURTHER REFILLS) 180 tablet 3   Cholecalciferol  (VITAMIN D ) 50 MCG (2000 UT) CAPS Take 1 capsule (2,000 Units total) by mouth daily. 30 capsule    cycloSPORINE (RESTASIS) 0.05 % ophthalmic emulsion Place 1 drop into both eyes 2 (two) times daily.     doxycycline  (VIBRA -TABS) 100 MG tablet Take 1 tablet (100 mg total) by mouth 2 (two) times daily. 120 tablet 3   ferrous sulfate  325 (65 FE) MG EC tablet Take 1 tablet (325 mg total) by mouth every Monday, Wednesday, and Friday.     fluconazole  (DIFLUCAN ) 150 MG tablet Take 1 tablet (150 mg total) by mouth daily. 10 tablet 1   furosemide  (LASIX ) 20 MG tablet TAKE 1 TABLET TWICE DAILY 60 tablet 0   glimepiride  (AMARYL ) 1 MG tablet Take 1 tablet (1 mg total) by mouth daily with breakfast. 90 tablet 4   lamoTRIgine  (LAMICTAL ) 200 MG tablet Take 400 mg by mouth daily.     levothyroxine  (SYNTHROID ) 150 MCG tablet Take 1 tablet (150 mcg total) by mouth at bedtime. 90 tablet 4   methocarbamol  (ROBAXIN ) 500 MG tablet Take 1 tablet (500 mg total) by mouth every 8 (eight) hours as needed for muscle spasms (neck pain). 30 tablet 0   montelukast  (SINGULAIR ) 10 MG tablet Take 1 tablet (10 mg total) by mouth at bedtime. 90 tablet 4   Multiple Vitamin (MULTIVITAMIN WITH MINERALS) TABS tablet Take 2 tablets by mouth daily.      nystatin  cream (MYCOSTATIN ) Apply 1 Application topically 2 (two) times daily. 30 g 0   pantoprazole  (PROTONIX ) 40 MG tablet Take 1  tablet (40 mg total) by mouth 2 (two) times daily. 180 tablet 4   potassium chloride  (KLOR-CON ) 10 MEQ tablet Take 1 tablet (10 mEq total) by mouth daily. With lasix . May take an additional 10 meq when taking 60 meq of lasix  100 tablet 3   PROZAC  20 MG capsule Take 60 mg by mouth daily.     simvastatin  (ZOCOR ) 40 MG tablet Take 1 tablet (40 mg total) by mouth every evening. 90 tablet 4   No facility-administered medications prior to visit.     Per HPI unless specifically indicated in ROS section below Review of Systems  Objective:  BP 134/66   Pulse 72   Temp 97.9 F (36.6 C) (Oral)   Ht 5' 3 (1.6 m)   Wt (!) 353 lb (160.1 kg) Comment: W/c  LMP  (LMP Unknown)   SpO2 95% Comment: 3 L O2  BMI 62.53 kg/m   Wt Readings from Last 3 Encounters:  09/16/24 (!) 353 lb (160.1 kg)  05/09/24 (!) 344 lb (156 kg)  04/15/24 (!) 344 lb 8 oz (156.3 kg)      Physical Exam  Vitals and nursing note reviewed.  Constitutional:      Appearance: She is obese.     Comments:  Sitting in wheelchair with supplemental oxygen  via portable oxygen  concentrator  Musculoskeletal:        General: Swelling and tenderness present.     Right lower leg: Edema present.     Left lower leg: Edema present.  Skin:    General: Skin is warm and dry.     Findings: No rash.  Neurological:     Mental Status: She is alert.     Comments:  FROM BUE  4/5 strength R arm/hand, 5/5 strength L arm/hand Limited ROM BLE due to pain at hips, knees 3/5 strength BLE  Psychiatric:        Mood and Affect: Mood normal.        Behavior: Behavior normal.       Results for orders placed or performed in visit on 04/08/24  VITAMIN D  25 Hydroxy (Vit-D Deficiency, Fractures)   Collection Time: 04/08/24 10:10 AM  Result Value Ref Range   VITD 45.76 30.00 - 100.00 ng/mL  IBC panel   Collection Time: 04/08/24 10:10 AM  Result Value Ref Range   Iron 70 42 - 145 ug/dL   Transferrin 764.9 787.9 - 360.0 mg/dL   Saturation Ratios  78.6 20.0 - 50.0 %   TIBC 329.0 250.0 - 450.0 mcg/dL  Ferritin   Collection Time: 04/08/24 10:10 AM  Result Value Ref Range   Ferritin 115.9 10.0 - 291.0 ng/mL  Vitamin B12   Collection Time: 04/08/24 10:10 AM  Result Value Ref Range   Vitamin B-12 443 211 - 911 pg/mL  CBC with Differential/Platelet   Collection Time: 04/08/24 10:10 AM  Result Value Ref Range   WBC 5.5 4.0 - 10.5 K/uL   RBC 4.37 3.87 - 5.11 Mil/uL   Hemoglobin 12.3 12.0 - 15.0 g/dL   HCT 62.1 63.9 - 53.9 %   MCV 86.5 78.0 - 100.0 fl   MCHC 32.5 30.0 - 36.0 g/dL   RDW 84.5 88.4 - 84.4 %   Platelets 217.0 150.0 - 400.0 K/uL   Neutrophils Relative % 63.8 43.0 - 77.0 %   Lymphocytes Relative 22.3 12.0 - 46.0 %   Monocytes Relative 10.9 3.0 - 12.0 %   Eosinophils Relative 2.4 0.0 - 5.0 %   Basophils Relative 0.6 0.0 - 3.0 %   Neutro Abs 3.5 1.4 - 7.7 K/uL   Lymphs Abs 1.2 0.7 - 4.0 K/uL   Monocytes Absolute 0.6 0.1 - 1.0 K/uL   Eosinophils Absolute 0.1 0.0 - 0.7 K/uL   Basophils Absolute 0.0 0.0 - 0.1 K/uL  TSH   Collection Time: 04/08/24 10:10 AM  Result Value Ref Range   TSH 2.40 0.35 - 5.50 uIU/mL  Comprehensive metabolic panel with GFR   Collection Time: 04/08/24 10:10 AM  Result Value Ref Range   Sodium 140 135 - 145 mEq/L   Potassium 4.4 3.5 - 5.1 mEq/L   Chloride 102 96 - 112 mEq/L   CO2 34 (H) 19 - 32 mEq/L   Glucose, Bld 215 (H) 70 - 99 mg/dL   BUN 17 6 - 23 mg/dL   Creatinine, Ser 9.38 0.40 - 1.20 mg/dL   Total Bilirubin 0.6 0.2 - 1.2 mg/dL   Alkaline Phosphatase 71 39 - 117 U/L   AST 18 0 - 37 U/L   ALT 18 0 - 35 U/L   Total Protein 6.5 6.0 - 8.3 g/dL  Albumin  4.3 3.5 - 5.2 g/dL   GFR 10.31 >39.99 mL/min   Calcium  9.1 8.4 - 10.5 mg/dL  Lipid panel   Collection Time: 04/08/24 10:10 AM  Result Value Ref Range   Cholesterol 120 0 - 200 mg/dL   Triglycerides 05.9 0.0 - 149.0 mg/dL   HDL 50.49 >60.99 mg/dL   VLDL 81.1 0.0 - 59.9 mg/dL   LDL Cholesterol 52 0 - 99 mg/dL   Total CHOL/HDL  Ratio 2    NonHDL 70.95   Hemoglobin A1c   Collection Time: 04/08/24 10:10 AM  Result Value Ref Range   Hgb A1c MFr Bld 8.0 (H) 4.6 - 6.5 %  C-reactive protein   Collection Time: 04/08/24 10:10 AM  Result Value Ref Range   CRP 1.8 0.5 - 20.0 mg/dL  Sedimentation rate   Collection Time: 04/08/24 10:10 AM  Result Value Ref Range   Sed Rate 26 0 - 30 mm/hr  Microalbumin / creatinine urine ratio   Collection Time: 04/08/24 10:11 AM  Result Value Ref Range   Microalb, Ur 2.9 (H) 0.0 - 1.9 mg/dL   Creatinine,U 12.8 mg/dL   Microalb Creat Ratio 32.9 (H) 0.0 - 30.0 mg/g    Assessment & Plan:   Problem List Items Addressed This Visit     Chronic pain syndrome (Chronic)   Chronic knee pain (Fourth Area of Pain) (Bilateral) (R>L) (Chronic)   Impaired mobility and activities of daily living (Chronic)   Failed back surgical syndrome (Chronic)   Chronic low back pain (Bilateral) (L>R) w/o sciatica (Chronic)   Osteoarthritis   Morbid obesity with BMI of 60.0-69.9, adult (HCC)   HFrEF (heart failure with reduced ejection fraction) (HCC)   Chronic respiratory failure with hypoxia, on home O2 therapy (HCC)   Continue portable oxygen  concentrator use.      Obesity hypoventilation syndrome (HCC)   Encounter for power mobility device assessment - Primary   Mobility assessment performed. She is appropriate for power wheelchair use.  Will write Rx for POV.  She would need large size power wheelchair.         No orders of the defined types were placed in this encounter.   No orders of the defined types were placed in this encounter.   There are no Patient Instructions on file for this visit.  Follow up plan: No follow-ups on file.  Anton Blas, MD   "

## 2024-09-16 NOTE — Assessment & Plan Note (Signed)
 Continue portable oxygen  concentrator use.

## 2024-09-16 NOTE — Assessment & Plan Note (Signed)
 Mobility assessment performed. She is appropriate for power wheelchair use.  Will write Rx for POV.  She would need large size power wheelchair.

## 2024-09-23 ENCOUNTER — Telehealth: Payer: Self-pay | Admitting: Family Medicine

## 2024-09-23 NOTE — Telephone Encounter (Signed)
 Pt would like to speak about her novonordisk.

## 2024-09-23 NOTE — Telephone Encounter (Signed)
 Copied from CRM #[id number]. Topic: General - Other >> Sep 23, 2024 10:48 AM Victoria A wrote: Reason for CRM: Sheena from [facility] called please call her back

## 2024-09-27 ENCOUNTER — Telehealth: Payer: Self-pay | Admitting: Pharmacist

## 2024-09-27 DIAGNOSIS — E1142 Type 2 diabetes mellitus with diabetic polyneuropathy: Secondary | ICD-10-CM

## 2024-09-27 DIAGNOSIS — E119 Type 2 diabetes mellitus without complications: Secondary | ICD-10-CM

## 2024-09-27 NOTE — Progress Notes (Unsigned)
 Brief Telephone Documentation Reason for Call: Patient left message regarding question for pharmacist   Summary of Call: Called patient to discuss. She is aware that Ozempic  is no longer offered in 2026. Confirms she is still taking Ozempic  though at 0.25 mg weekly dose.  Also notes she has the following in her Fridge: Blue box (1mg ) and a yellow box (2mg ) unopened at home. Last dose from the red box used last week.   Denies issues/concerns on current 0.25 mg dose. Notes a small bit of appetite suppression/craving reduction. No side effects reported with 0.25 mg dose. Never increased to 0.5 mg dose.   Patient-Reported Regimen: Glimepiride  1 mg each morning Ozempic  0.25 mg once weekly (Wed)  SMBG: Checks intermittently via glucometer. Over the past month, generally recalls readings ~140s or so.  Denies s/sx low sugars mid-day on glipizide. Denies skipped meals. No glucometer readings <100.   Considerations Discussed Trulicity  as an option to discuss with PCP in 2026 (via PAP) if Ozempic  is not affordable through her insurance though still has a couple months of Ozempic  remaining. Next dose is due Wednesday (tomorrow).  Walked through Ozempic  microdosing on the phone with patient. Patient counted clicks aloud to reach 36 clicks (0.5 mg dose on the 1.0 mg pen). She confirms marking this point with a Sharpie.  Ozempic  ( 1 mg pen ) - Blue Box (4mg /85mL)  Ozempic  Dose via Microdosing of 1 mg Pen Number of Clicks  0.5 mg  36 clicks   Wednesday 12/31:  Increase Ozempic  to 0.5 mg weekly dose x4 weeks Phone check-in with PharmD 4 weeks   Follow Up: Patient given direct line for further questions/concerns. Phone follow up with PharmD 4 weeks  Future Appointments  Date Time Provider Department Center  10/06/2024  1:30 PM Luiz Channel, MD [facility]  10/17/2024  8:30 AM [facility] [address]  04/11/2025 10:50 AM [facility] ANNUAL WELLNESS VISIT 1 [facility] [address]  04/14/2025  8:30 AM  [facility] [address]  04/21/2025  3:30 PM Rilla Baller, MD [facility] [address]    [name], PharmD, [place], CPP Clinical Pharmacist Practitioner [facility] Ph: [phone]

## 2024-09-28 NOTE — Telephone Encounter (Signed)
Agree with recommendations made. Thank you.

## 2024-10-05 ENCOUNTER — Other Ambulatory Visit: Payer: Self-pay | Admitting: Neurosurgery

## 2024-10-05 ENCOUNTER — Other Ambulatory Visit: Payer: Self-pay | Admitting: Internal Medicine

## 2024-10-05 DIAGNOSIS — M47812 Spondylosis without myelopathy or radiculopathy, cervical region: Secondary | ICD-10-CM

## 2024-10-05 NOTE — Telephone Encounter (Signed)
Appt 1/8

## 2024-10-06 ENCOUNTER — Other Ambulatory Visit: Payer: Self-pay

## 2024-10-06 ENCOUNTER — Telehealth (INDEPENDENT_AMBULATORY_CARE_PROVIDER_SITE_OTHER): Admitting: Internal Medicine

## 2024-10-06 DIAGNOSIS — M542 Cervicalgia: Secondary | ICD-10-CM

## 2024-10-06 DIAGNOSIS — T847XXD Infection and inflammatory reaction due to other internal orthopedic prosthetic devices, implants and grafts, subsequent encounter: Secondary | ICD-10-CM

## 2024-10-06 NOTE — Progress Notes (Unsigned)
 Virtual Visit via Video Note  I connected with Amy Barnes on 10/06/2024 at  1:30 PM EST by a video enabled telemedicine application and verified that I am speaking with the correct person using two identifiers.  Location: Patient:at home Provider: at clinic   I discussed the limitations of evaluation and management by telemedicine and the availability of in person appointments. The patient expressed understanding and agreed to proceed.  History of Present Illness:  Continues to take doxy As needed fluconazole  Has had increasing neck pain ( seeing dr mavis who is going to do some imaging). Having significant pain from the arthritis to cervical spine.   Observations/Objective:  A x 0 by 3 in NAD Lab Results  Component Value Date   ESRSEDRATE 26 04/08/2024   Lab Results  Component Value Date   CRP 1.8 04/08/2024    Assessment and Plan:  Will check cbc, bmp, sed rate and crp Continue/refill - with doxy As needed fluconazole   Follow Up Instructions:    I discussed the assessment and treatment plan with the patient. The patient was provided an opportunity to ask questions and all were answered. The patient agreed with the plan and demonstrated an understanding of the instructions.   The patient was advised to call back or seek an in-person evaluation if the symptoms worsen or if the condition fails to improve as anticipated.  I provided *** minutes of non-face-to-face time during this encounter.   Montie Bologna, MD

## 2024-10-06 NOTE — Telephone Encounter (Signed)
 Following up on PAP pt has not return application or return call left a HIPAA VM.

## 2024-10-11 NOTE — Telephone Encounter (Signed)
 Message sent to provider.   Keric Zehren, BSN, RN

## 2024-10-11 NOTE — Telephone Encounter (Signed)
 Okay to refill per Dr. Levern Reader.   Jillian Pianka, BSN, RN

## 2024-10-14 ENCOUNTER — Ambulatory Visit (HOSPITAL_COMMUNITY)
Admission: RE | Admit: 2024-10-14 | Discharge: 2024-10-14 | Disposition: A | Discharge status: Home / Self Care | Source: Ambulatory Visit | Attending: Neurosurgery | Admitting: Neurosurgery

## 2024-10-14 DIAGNOSIS — M47812 Spondylosis without myelopathy or radiculopathy, cervical region: Secondary | ICD-10-CM | POA: Diagnosis present

## 2024-10-16 ENCOUNTER — Other Ambulatory Visit: Payer: Self-pay | Admitting: Family Medicine

## 2024-10-16 DIAGNOSIS — E1142 Type 2 diabetes mellitus with diabetic polyneuropathy: Secondary | ICD-10-CM

## 2024-10-17 ENCOUNTER — Other Ambulatory Visit

## 2024-10-17 ENCOUNTER — Telehealth: Payer: Self-pay

## 2024-10-17 DIAGNOSIS — E1142 Type 2 diabetes mellitus with diabetic polyneuropathy: Secondary | ICD-10-CM

## 2024-10-17 NOTE — Telephone Encounter (Signed)
 Copied from CRM #[id number]. Topic: Appointments - Scheduling Inquiry for Clinic >> Oct 17, 2024  9:05 AM Robinson H wrote: Reason for CRM: Patients husband wants to re-schedule lab appointment for today and have labs drawn off outside provider. Only 1 lab order from Dr. Rilla in system and they want all labs done.   Signe [phone]   Called family they need labs done in our office from visit with Dr. Luiz on 10/06/24. Can we do that here?

## 2024-10-19 ENCOUNTER — Other Ambulatory Visit

## 2024-10-19 DIAGNOSIS — E1142 Type 2 diabetes mellitus with diabetic polyneuropathy: Secondary | ICD-10-CM | POA: Diagnosis not present

## 2024-10-19 DIAGNOSIS — T847XXD Infection and inflammatory reaction due to other internal orthopedic prosthetic devices, implants and grafts, subsequent encounter: Secondary | ICD-10-CM

## 2024-10-19 NOTE — Addendum Note (Signed)
 Addended by: RILLA BALLER on: 10/19/2024 07:31 AM   Modules accepted: Orders

## 2024-10-19 NOTE — Telephone Encounter (Signed)
 Ok to do this if ok by lab. ID labs also in the system.

## 2024-10-20 LAB — CBC WITH DIFFERENTIAL/PLATELET
Absolute Lymphocytes: 1939 {cells}/uL (ref 850–3900)
Absolute Monocytes: 733 {cells}/uL (ref 200–950)
Basophils Absolute: 30 {cells}/uL (ref 0–200)
Basophils Relative: 0.4 %
Eosinophils Absolute: 192 {cells}/uL (ref 15–500)
Eosinophils Relative: 2.6 %
HCT: 39.4 % (ref 35.9–46.0)
Hemoglobin: 12.9 g/dL (ref 11.7–15.5)
MCH: 29.1 pg (ref 27.0–33.0)
MCHC: 32.7 g/dL (ref 31.6–35.4)
MCV: 88.9 fL (ref 81.4–101.7)
MPV: 12 fL (ref 7.5–12.5)
Monocytes Relative: 9.9 %
Neutro Abs: 4507 {cells}/uL (ref 1500–7800)
Neutrophils Relative %: 60.9 %
Platelets: 238 Thousand/uL (ref 140–400)
RBC: 4.43 Million/uL (ref 3.80–5.10)
RDW: 13.8 % (ref 11.0–15.0)
Total Lymphocyte: 26.2 %
WBC: 7.4 Thousand/uL (ref 3.8–10.8)

## 2024-10-20 LAB — BASIC METABOLIC PANEL WITH GFR
BUN: 23 mg/dL (ref 7–25)
CO2: 30 mmol/L (ref 20–32)
Calcium: 9.8 mg/dL (ref 8.6–10.4)
Chloride: 101 mmol/L (ref 98–110)
Creat: 0.83 mg/dL (ref 0.60–1.00)
Glucose, Bld: 175 mg/dL — ABNORMAL HIGH (ref 65–99)
Potassium: 4.6 mmol/L (ref 3.5–5.3)
Sodium: 140 mmol/L (ref 135–146)
eGFR: 75 mL/min/1.73m2

## 2024-10-20 LAB — C-REACTIVE PROTEIN: CRP: 3.5 mg/L

## 2024-10-20 LAB — SEDIMENTATION RATE: Sed Rate: 17 mm/h (ref 0–30)

## 2024-10-20 LAB — HEMOGLOBIN A1C: Hgb A1c MFr Bld: 7 % — ABNORMAL HIGH (ref 4.6–6.5)

## 2024-10-24 ENCOUNTER — Ambulatory Visit: Payer: Self-pay | Admitting: Family Medicine

## 2025-04-11 ENCOUNTER — Ambulatory Visit

## 2025-04-14 ENCOUNTER — Other Ambulatory Visit

## 2025-04-21 ENCOUNTER — Encounter: Admitting: Family Medicine
